# Patient Record
Sex: Female | Born: 1955
Health system: Southern US, Community
[De-identification: ages and names within clinical notes are randomized; demographics above are authoritative.]

## PROBLEM LIST (undated history)

## (undated) ENCOUNTER — Encounter

## (undated) ENCOUNTER — Encounter: Attending: Critical Care Medicine | Primary: Critical Care Medicine

## (undated) ENCOUNTER — Telehealth: Attending: Dermatology | Primary: Dermatology

## (undated) ENCOUNTER — Ambulatory Visit
Payer: MEDICARE | Attending: Student in an Organized Health Care Education/Training Program | Primary: Student in an Organized Health Care Education/Training Program

## (undated) ENCOUNTER — Ambulatory Visit: Payer: MEDICARE | Attending: Gastroenterology | Primary: Gastroenterology

## (undated) ENCOUNTER — Ambulatory Visit
Payer: Medicare (Managed Care) | Attending: Student in an Organized Health Care Education/Training Program | Primary: Student in an Organized Health Care Education/Training Program

## (undated) ENCOUNTER — Ambulatory Visit: Payer: MEDICARE

## (undated) ENCOUNTER — Encounter: Attending: Nephrology | Primary: Nephrology

## (undated) ENCOUNTER — Encounter
Payer: Medicare (Managed Care) | Attending: Student in an Organized Health Care Education/Training Program | Primary: Student in an Organized Health Care Education/Training Program

## (undated) ENCOUNTER — Encounter: Payer: MEDICARE | Attending: Otolaryngology | Primary: Otolaryngology

## (undated) ENCOUNTER — Ambulatory Visit

## (undated) ENCOUNTER — Encounter: Attending: Adult Health | Primary: Adult Health

## (undated) ENCOUNTER — Encounter: Attending: Obstetrics & Gynecology | Primary: Obstetrics & Gynecology

## (undated) ENCOUNTER — Encounter
Attending: Student in an Organized Health Care Education/Training Program | Primary: Student in an Organized Health Care Education/Training Program

## (undated) ENCOUNTER — Telehealth

## (undated) ENCOUNTER — Telehealth
Attending: Student in an Organized Health Care Education/Training Program | Primary: Student in an Organized Health Care Education/Training Program

## (undated) ENCOUNTER — Encounter: Attending: Psychologist | Primary: Psychologist

## (undated) ENCOUNTER — Encounter: Attending: Otolaryngology | Primary: Otolaryngology

## (undated) ENCOUNTER — Encounter: Payer: MEDICARE | Attending: Gastroenterology | Primary: Gastroenterology

## (undated) ENCOUNTER — Non-Acute Institutional Stay
Payer: MEDICARE | Attending: Student in an Organized Health Care Education/Training Program | Primary: Student in an Organized Health Care Education/Training Program

## (undated) ENCOUNTER — Ambulatory Visit: Payer: MEDICARE | Attending: Psychologist | Primary: Psychologist

## (undated) ENCOUNTER — Encounter
Payer: MEDICARE | Attending: Student in an Organized Health Care Education/Training Program | Primary: Student in an Organized Health Care Education/Training Program

## (undated) ENCOUNTER — Encounter: Attending: Psychiatry | Primary: Psychiatry

## (undated) ENCOUNTER — Ambulatory Visit
Payer: MEDICARE | Attending: Rehabilitative and Restorative Service Providers" | Primary: Rehabilitative and Restorative Service Providers"

## (undated) ENCOUNTER — Encounter: Attending: Medical | Primary: Medical

## (undated) ENCOUNTER — Ambulatory Visit: Payer: Medicare (Managed Care) | Attending: Psychologist | Primary: Psychologist

## (undated) ENCOUNTER — Ambulatory Visit: Payer: MEDICAID

## (undated) ENCOUNTER — Ambulatory Visit: Payer: MEDICARE | Attending: Adult Health | Primary: Adult Health

## (undated) ENCOUNTER — Ambulatory Visit: Payer: MEDICARE | Attending: Otolaryngology | Primary: Otolaryngology

## (undated) ENCOUNTER — Ambulatory Visit: Payer: PRIVATE HEALTH INSURANCE

## (undated) ENCOUNTER — Ambulatory Visit: Attending: Otolaryngology | Primary: Otolaryngology

## (undated) ENCOUNTER — Encounter: Attending: Family Medicine | Primary: Family Medicine

## (undated) ENCOUNTER — Ambulatory Visit: Attending: Pharmacist | Primary: Pharmacist

## (undated) ENCOUNTER — Encounter: Attending: Dermatology | Primary: Dermatology

## (undated) ENCOUNTER — Ambulatory Visit: Payer: Medicare (Managed Care) | Attending: Adult Health | Primary: Adult Health

## (undated) ENCOUNTER — Encounter: Payer: MEDICARE | Attending: Psychiatry | Primary: Psychiatry

## (undated) ENCOUNTER — Ambulatory Visit: Attending: Ambulatory Care | Primary: Ambulatory Care

## (undated) ENCOUNTER — Ambulatory Visit: Payer: Medicare (Managed Care) | Attending: Acute Care | Primary: Acute Care

## (undated) ENCOUNTER — Encounter: Payer: MEDICARE | Attending: Critical Care Medicine | Primary: Critical Care Medicine

## (undated) ENCOUNTER — Ambulatory Visit: Attending: Family Medicine | Primary: Family Medicine

## (undated) ENCOUNTER — Ambulatory Visit
Payer: Medicare (Managed Care) | Attending: Rehabilitative and Restorative Service Providers" | Primary: Rehabilitative and Restorative Service Providers"

## (undated) ENCOUNTER — Encounter: Payer: MEDICARE | Attending: Dermatology | Primary: Dermatology

## (undated) ENCOUNTER — Ambulatory Visit: Payer: Medicare (Managed Care)

## (undated) ENCOUNTER — Ambulatory Visit
Payer: PRIVATE HEALTH INSURANCE | Attending: Student in an Organized Health Care Education/Training Program | Primary: Student in an Organized Health Care Education/Training Program

## (undated) ENCOUNTER — Telehealth: Attending: Nephrology | Primary: Nephrology

## (undated) ENCOUNTER — Encounter: Attending: Gastroenterology | Primary: Gastroenterology

## (undated) ENCOUNTER — Encounter: Payer: MEDICARE | Attending: MOHS-Micrographic Surgery | Primary: MOHS-Micrographic Surgery

## (undated) ENCOUNTER — Ambulatory Visit: Payer: Medicaid (Managed Care) | Attending: Medical | Primary: Medical

## (undated) ENCOUNTER — Encounter: Attending: Pulmonary Disease | Primary: Pulmonary Disease

## (undated) ENCOUNTER — Encounter: Payer: MEDICARE | Attending: Nephrology | Primary: Nephrology

## (undated) ENCOUNTER — Encounter
Attending: Rehabilitative and Restorative Service Providers" | Primary: Rehabilitative and Restorative Service Providers"

## (undated) ENCOUNTER — Encounter: Attending: Pharmacist | Primary: Pharmacist

## (undated) ENCOUNTER — Ambulatory Visit: Payer: Medicare (Managed Care) | Attending: Otolaryngology | Primary: Otolaryngology

## (undated) ENCOUNTER — Telehealth: Attending: Critical Care Medicine | Primary: Critical Care Medicine

## (undated) ENCOUNTER — Encounter: Payer: MEDICARE | Attending: Psychologist | Primary: Psychologist

## (undated) ENCOUNTER — Encounter: Payer: MEDICARE | Attending: Neurology | Primary: Neurology

## (undated) ENCOUNTER — Encounter: Attending: Emergency Medicine | Primary: Emergency Medicine

## (undated) ENCOUNTER — Ambulatory Visit: Payer: Medicare (Managed Care) | Attending: Nephrology | Primary: Nephrology

## (undated) ENCOUNTER — Non-Acute Institutional Stay: Payer: MEDICARE

## (undated) ENCOUNTER — Encounter: Attending: Internal Medicine | Primary: Internal Medicine

## (undated) ENCOUNTER — Telehealth: Attending: Gastroenterology | Primary: Gastroenterology

## (undated) ENCOUNTER — Encounter: Payer: Medicare (Managed Care) | Attending: Medical | Primary: Medical

## (undated) ENCOUNTER — Ambulatory Visit: Payer: PRIVATE HEALTH INSURANCE | Attending: Medical | Primary: Medical

## (undated) ENCOUNTER — Ambulatory Visit: Payer: MEDICAID | Attending: Adult Health | Primary: Adult Health

## (undated) ENCOUNTER — Ambulatory Visit: Attending: Psychologist | Primary: Psychologist

## (undated) ENCOUNTER — Ambulatory Visit: Payer: Medicare (Managed Care) | Attending: Medical | Primary: Medical

## (undated) ENCOUNTER — Telehealth: Attending: Medical | Primary: Medical

## (undated) ENCOUNTER — Ambulatory Visit
Payer: MEDICAID | Attending: Student in an Organized Health Care Education/Training Program | Primary: Student in an Organized Health Care Education/Training Program

## (undated) DIAGNOSIS — I10 Essential (primary) hypertension: Secondary | ICD-10-CM

## (undated) DIAGNOSIS — M545 Low back pain, unspecified: Secondary | ICD-10-CM

## (undated) DIAGNOSIS — G8929 Other chronic pain: Secondary | ICD-10-CM

## (undated) DIAGNOSIS — M797 Fibromyalgia: Secondary | ICD-10-CM

## (undated) DIAGNOSIS — J8410 Pulmonary fibrosis, unspecified: Secondary | ICD-10-CM

## (undated) DIAGNOSIS — M329 Systemic lupus erythematosus, unspecified: Secondary | ICD-10-CM

## (undated) DIAGNOSIS — M1990 Unspecified osteoarthritis, unspecified site: Secondary | ICD-10-CM

## (undated) DIAGNOSIS — F419 Anxiety disorder, unspecified: Secondary | ICD-10-CM

## (undated) DIAGNOSIS — K219 Gastro-esophageal reflux disease without esophagitis: Secondary | ICD-10-CM

## (undated) DIAGNOSIS — E559 Vitamin D deficiency, unspecified: Secondary | ICD-10-CM

## (undated) DIAGNOSIS — N189 Chronic kidney disease, unspecified: Secondary | ICD-10-CM

## (undated) DIAGNOSIS — J310 Chronic rhinitis: Secondary | ICD-10-CM

## (undated) DIAGNOSIS — E119 Type 2 diabetes mellitus without complications: Secondary | ICD-10-CM

## (undated) DIAGNOSIS — R202 Paresthesia of skin: Secondary | ICD-10-CM

## (undated) DIAGNOSIS — J984 Other disorders of lung: Secondary | ICD-10-CM

## (undated) DIAGNOSIS — G4700 Insomnia, unspecified: Secondary | ICD-10-CM

## (undated) DIAGNOSIS — G5139 Clonic hemifacial spasm, unspecified: Secondary | ICD-10-CM

## (undated) DIAGNOSIS — H9191 Unspecified hearing loss, right ear: Secondary | ICD-10-CM

## (undated) DIAGNOSIS — E785 Hyperlipidemia, unspecified: Secondary | ICD-10-CM

## (undated) DIAGNOSIS — F319 Bipolar disorder, unspecified: Secondary | ICD-10-CM

## (undated) HISTORY — DX: Other chronic pain: G89.29

## (undated) HISTORY — DX: Bipolar disorder, unspecified: F31.9

## (undated) HISTORY — DX: Chronic rhinitis: J31.0

## (undated) HISTORY — DX: Other disorders of lung: J98.4

## (undated) HISTORY — DX: Essential (primary) hypertension: I10

## (undated) HISTORY — PX: BRAIN SURGERY: SHX531

## (undated) HISTORY — DX: Fibromyalgia: M79.7

## (undated) HISTORY — DX: Chronic kidney disease, unspecified: N18.9

## (undated) HISTORY — DX: Paresthesia of skin: R20.2

## (undated) HISTORY — DX: Unspecified hearing loss, right ear: H91.91

## (undated) HISTORY — PX: VAGINAL HYSTERECTOMY: SHX2639

## (undated) HISTORY — DX: Systemic lupus erythematosus, unspecified: M32.9

## (undated) HISTORY — DX: Hyperlipidemia, unspecified: E78.5

## (undated) HISTORY — DX: Gastro-esophageal reflux disease without esophagitis: K21.9

## (undated) HISTORY — DX: Low back pain, unspecified: M54.50

## (undated) HISTORY — DX: Unspecified osteoarthritis, unspecified site: M19.90

## (undated) HISTORY — DX: Anxiety disorder, unspecified: F41.9

## (undated) HISTORY — PX: CHOLECYSTECTOMY: SHX55

## (undated) HISTORY — DX: Clonic hemifacial spasm, unspecified: G51.39

## (undated) HISTORY — DX: Insomnia, unspecified: G47.00

## (undated) HISTORY — DX: Vitamin D deficiency, unspecified: E55.9

## (undated) HISTORY — PX: OTHER SURGICAL HISTORY: SHX169

## (undated) HISTORY — DX: Low back pain: M54.5

## (undated) MED ORDER — LOSARTAN 50 MG TABLET: Freq: Every day | ORAL | 0 days

---
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
## 2003-11-27 ENCOUNTER — Ambulatory Visit (HOSPITAL_COMMUNITY): Admission: RE | Admit: 2003-11-27 | Discharge: 2003-11-27 | Payer: Self-pay | Admitting: Orthopedic Surgery

## 2003-11-27 ENCOUNTER — Ambulatory Visit (HOSPITAL_BASED_OUTPATIENT_CLINIC_OR_DEPARTMENT_OTHER): Admission: RE | Admit: 2003-11-27 | Discharge: 2003-11-27 | Payer: Self-pay | Admitting: Orthopedic Surgery

## 2003-12-24 ENCOUNTER — Ambulatory Visit (HOSPITAL_BASED_OUTPATIENT_CLINIC_OR_DEPARTMENT_OTHER): Admission: RE | Admit: 2003-12-24 | Discharge: 2003-12-24 | Payer: Self-pay | Admitting: Orthopedic Surgery

## 2004-11-11 ENCOUNTER — Emergency Department: Payer: Self-pay | Admitting: Emergency Medicine

## 2005-01-25 DIAGNOSIS — I10 Essential (primary) hypertension: Secondary | ICD-10-CM | POA: Insufficient documentation

## 2008-02-09 ENCOUNTER — Emergency Department: Payer: Self-pay | Admitting: Emergency Medicine

## 2008-02-09 ENCOUNTER — Other Ambulatory Visit: Payer: Self-pay

## 2008-07-01 DIAGNOSIS — R110 Nausea: Secondary | ICD-10-CM

## 2008-10-17 ENCOUNTER — Emergency Department: Payer: Self-pay | Admitting: Emergency Medicine

## 2008-11-13 ENCOUNTER — Emergency Department: Payer: Self-pay | Admitting: Emergency Medicine

## 2008-12-05 ENCOUNTER — Ambulatory Visit: Payer: Self-pay | Admitting: Family Medicine

## 2009-10-22 ENCOUNTER — Ambulatory Visit: Payer: Self-pay | Admitting: Family Medicine

## 2009-11-24 ENCOUNTER — Emergency Department: Payer: Self-pay | Admitting: Emergency Medicine

## 2010-05-04 ENCOUNTER — Ambulatory Visit: Payer: Self-pay | Admitting: Family Medicine

## 2010-07-19 ENCOUNTER — Other Ambulatory Visit: Payer: Self-pay | Admitting: Nephrology

## 2010-07-30 ENCOUNTER — Other Ambulatory Visit: Payer: Self-pay | Admitting: Family Medicine

## 2010-08-02 ENCOUNTER — Emergency Department: Payer: Self-pay | Admitting: Emergency Medicine

## 2010-08-11 ENCOUNTER — Other Ambulatory Visit: Payer: Self-pay | Admitting: Family Medicine

## 2010-09-02 ENCOUNTER — Observation Stay: Payer: Self-pay | Admitting: Internal Medicine

## 2010-11-05 DIAGNOSIS — M3213 Lung involvement in systemic lupus erythematosus: Secondary | ICD-10-CM | POA: Insufficient documentation

## 2010-12-01 ENCOUNTER — Ambulatory Visit: Payer: Self-pay

## 2010-12-08 ENCOUNTER — Other Ambulatory Visit: Payer: Self-pay | Admitting: Nephrology

## 2010-12-29 DIAGNOSIS — D352 Benign neoplasm of pituitary gland: Secondary | ICD-10-CM | POA: Insufficient documentation

## 2010-12-29 DIAGNOSIS — D353 Benign neoplasm of craniopharyngeal duct: Secondary | ICD-10-CM

## 2011-01-03 ENCOUNTER — Ambulatory Visit: Payer: Self-pay | Admitting: General Surgery

## 2011-01-05 LAB — PATHOLOGY REPORT

## 2011-08-04 ENCOUNTER — Ambulatory Visit: Payer: Self-pay | Admitting: Internal Medicine

## 2012-01-29 ENCOUNTER — Emergency Department: Payer: Self-pay | Admitting: *Deleted

## 2012-05-04 DIAGNOSIS — N3946 Mixed incontinence: Secondary | ICD-10-CM | POA: Insufficient documentation

## 2012-06-20 ENCOUNTER — Emergency Department: Payer: Self-pay | Admitting: Emergency Medicine

## 2012-06-20 LAB — COMPREHENSIVE METABOLIC PANEL
Albumin: 3.4 g/dL (ref 3.4–5.0)
Anion Gap: 6 — ABNORMAL LOW (ref 7–16)
BUN: 10 mg/dL (ref 7–18)
Calcium, Total: 8.9 mg/dL (ref 8.5–10.1)
Chloride: 99 mmol/L (ref 98–107)
Co2: 33 mmol/L — ABNORMAL HIGH (ref 21–32)
EGFR (African American): >60
EGFR (Non-African Amer.): >60
Osmolality: 274 (ref 275–301)
Potassium: 3.8 mmol/L (ref 3.5–5.1)
SGPT (ALT): 21 U/L (ref 12–78)
Total Protein: 8.6 g/dL — ABNORMAL HIGH (ref 6.4–8.2)

## 2012-06-20 LAB — TROPONIN I: Troponin-I: <0.02 ng/mL

## 2012-06-20 LAB — URINALYSIS, COMPLETE
Bacteria: NONE SEEN
Nitrite: NEGATIVE
Protein: NEGATIVE
RBC,UR: 1 /HPF (ref 0–5)
Squamous Epithelial: 3

## 2012-06-20 LAB — CBC
HCT: 40.7 % (ref 35.0–47.0)
HGB: 13.5 g/dL (ref 12.0–16.0)
WBC: 4.1 10*3/uL (ref 3.6–11.0)

## 2012-06-20 LAB — TSH: Thyroid Stimulating Horm: 1.11 u[IU]/mL

## 2012-06-20 LAB — CK TOTAL AND CKMB (NOT AT ARMC): CK, Total: 215 U/L (ref 21–215)

## 2012-09-08 ENCOUNTER — Emergency Department: Payer: Self-pay | Admitting: Emergency Medicine

## 2012-09-08 LAB — COMPREHENSIVE METABOLIC PANEL
Albumin: 3.4 g/dL (ref 3.4–5.0)
Anion Gap: 4 — ABNORMAL LOW (ref 7–16)
Chloride: 107 mmol/L (ref 98–107)
Co2: 30 mmol/L (ref 21–32)
Creatinine: 0.95 mg/dL (ref 0.60–1.30)
EGFR (African American): >60
EGFR (Non-African Amer.): >60
Glucose: 102 mg/dL — ABNORMAL HIGH (ref 65–99)
Osmolality: 280 (ref 275–301)
Potassium: 4.1 mmol/L (ref 3.5–5.1)
SGPT (ALT): 28 U/L (ref 12–78)
Total Protein: 8.7 g/dL — ABNORMAL HIGH (ref 6.4–8.2)

## 2012-09-08 LAB — CBC
HCT: 39.4 % (ref 35.0–47.0)
HGB: 13.4 g/dL (ref 12.0–16.0)
RBC: 4.63 10*6/uL (ref 3.80–5.20)
RDW: 14.8 % — ABNORMAL HIGH (ref 11.5–14.5)

## 2012-09-09 ENCOUNTER — Observation Stay: Payer: Self-pay | Admitting: Internal Medicine

## 2012-09-09 LAB — TROPONIN I: Troponin-I: <0.02 ng/mL

## 2012-09-09 LAB — PROTIME-INR: Prothrombin Time: 13.8 secs (ref 11.5–14.7)

## 2012-09-14 LAB — CULTURE, BLOOD (SINGLE)

## 2012-10-22 ENCOUNTER — Ambulatory Visit (INDEPENDENT_AMBULATORY_CARE_PROVIDER_SITE_OTHER): Payer: Medicare Other | Admitting: Pulmonary Disease

## 2012-10-22 ENCOUNTER — Encounter: Payer: Self-pay | Admitting: Pulmonary Disease

## 2012-10-22 VITALS — BP 122/82 | HR 86 | Ht 68.0 in | Wt 285.0 lb

## 2012-10-22 DIAGNOSIS — Z8709 Personal history of other diseases of the respiratory system: Secondary | ICD-10-CM | POA: Insufficient documentation

## 2012-10-22 DIAGNOSIS — J690 Pneumonitis due to inhalation of food and vomit: Secondary | ICD-10-CM | POA: Insufficient documentation

## 2012-10-22 DIAGNOSIS — J8410 Pulmonary fibrosis, unspecified: Secondary | ICD-10-CM | POA: Insufficient documentation

## 2012-10-22 DIAGNOSIS — R062 Wheezing: Secondary | ICD-10-CM

## 2012-10-22 MED ORDER — BUDESONIDE-FORMOTEROL FUMARATE 160-4.5 MCG/ACT IN AERO: 2.0000 | INHALATION_SPRAY | Freq: Two times a day (BID) | RESPIRATORY_TRACT | Status: DC

## 2012-10-22 NOTE — Assessment & Plan Note (Signed)
She has a known diagnosis of vocal cord dysfunction which can certainly cause wheezing. I will obtain records from [facility] ear nose and throat and the  Voice Center there so I can learn the details of her treatment.  In the meantime we will start Symbicort twice a day to see if it helps with the wheezing.

## 2012-10-22 NOTE — Patient Instructions (Addendum)
We will request records from [facility] and [facility] We will send you to [facility] for pulmonary function testing Take the symbicort two puffs twice a day We will see you back in one month

## 2012-10-22 NOTE — Progress Notes (Signed)
Subjective:    Patient ID: Nicole Ballard, female    DOB: 21-Jun-1956, 57 y.o.   MRN: [id number]  HPI Nicole Ballard is a 57 year old very pleasant lifetime nonsmoker who comes to our clinic today for evaluation of shortness of breath, cough, wheezing, and sputum production. She states that she had a normal childhood without significant respiratory illnesses aside from some mild hayfever. 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 She worked there for 11 years and quit in the mid-2000. She was diagnosed with lupus around 2005 after experiencing muscle and joint aches. She has been followed for this at [facility] and currently takes plaque when out for the same. She tells me that she has "episcleritis" which has been treated with steroid drops by a local ophthalmologist. She has also been diagnosed with vocal cord dysfunction by ENT at [facility] and is currently undergoing therapy at a voice center there. She has had bronchitis on an annual basis for many years during adulthood. This has been treated with antibiotics and various inhalers. In January of 2014 she was hospitalized for shortness of breath, wheezing, cough, and sputum production. She says that she was told that she "possibly had pneumonia". Her hospitalization only lasted 2 days and she states that her symptoms have not resolved since hospital discharge. She has experienced chills but no fever since this time. She states that she takes an albuterol inhaler which provides some relief of cough and shortness of breath but makes her very jittery and anxious. She states that in the morning she frequently produces a scant amount of sputum. During this time she has experienced an increase in eye redness and itching but she denies sinus congestion or writing nose. She also denies heartburn symptoms.  She notes that she moved to a new apartment approximately 6 months  ago and the symptoms have developed since then. She also states that she has had increasing eye redness and itching since then.   Past Medical History  Diagnosis Date  . Fibromyalgia   . Paresthesia   . GERD (gastroesophageal reflux disease)   . Rhinitis   . Systemic lupus erythematosus   . Hemifacial spasm   . Chronic kidney disease   . Chronic pain   . Anxiety   . Vitamin D deficiency   . Bipolar 1 disorder   . Hypertension   . Osteoarthritis   . Lumbago   . Insomnia   . Hyperlipidemia   . Hearing loss of right ear      Family History  Problem Relation Age of Onset  . Breast cancer Mother   . Cancer Father   . Breast cancer Maternal Aunt      History   Social History  . Marital Status: Divorced    Spouse Name: N/A    Number of Children: N/A  . Years of Education: N/A   Occupational History  . Not on file.   Social History Main Topics  . Smoking status: Never Smoker   . Smokeless tobacco: Never Used  . Alcohol Use: No  . Drug Use: No  . Sexually Active: Not on file   Other Topics Concern  . Not on file   Social History Narrative  . No narrative on file     Allergies  Allergen Reactions  . Penicillins      No outpatient prescriptions prior to visit.   No facility-administered  medications prior to visit.      Review of Systems  Constitutional: Negative for fever and unexpected weight change.  HENT: Negative for ear pain, nosebleeds, congestion, sore throat, rhinorrhea, sneezing, trouble swallowing, dental problem, postnasal drip and sinus pressure.   Eyes: Negative for redness and itching.  Respiratory: Positive for cough, shortness of breath and wheezing. Negative for chest tightness.   Cardiovascular: Negative for palpitations and leg swelling.  Gastrointestinal: Negative for nausea and vomiting.  Genitourinary: Negative for dysuria.  Musculoskeletal: Negative for joint swelling.  Skin: Negative for rash.  Neurological: Negative for  headaches.  Hematological: Does not bruise/bleed easily.  Psychiatric/Behavioral: Negative for dysphoric mood. The patient is not nervous/anxious.        Objective:   Physical Exam  Filed Vitals:   10/22/12 1449  BP: 122/82  Pulse: 86  Height: 5\' 8"  (1.727 m)  Weight: 285 lb (129.275 kg)  SpO2: 96%   Gen: well appearing, no acute distress HEENT: NCAT, PERRL, EOMi, OP clear, neck supple without masses PULM: wheezes bilaterally, insp crackles in bases bilaterally CV: RRR, systolic murmur noted, no JVD AB: BS+, soft, nontender, no hsm Ext: warm, trace pretibial edema, no clubbing, no cyanosis Derm: no rash or skin breakdown Neuro: A&Ox4, CN II-XII intact, strength 5/5 in all 4 extremities       Assessment & Plan:   Postinflammatory pulmonary fibrosis Based on a chest x-ray from [place] in January 2014 I see increased interstitial markings and what looks like mediastinal lymphadenopathy. The differential diagnosis here includes sarcoidosis versus lupus pneumonitis versus NSIP and other interstitial lung diseases.  At this point, because she is wheezing we will focus today's intervention on adding a bronchodilator and steroid combination to see if that helps with her symptoms. In the meantime I will obtain records from [facility] rheumatology and ear nose and throat to see the details of her lupus workup.  We will also obtain full pulmonary function testing from [facility]. In one month's time, after I've been able to review the results of the pulmonary function tests, see how she does with Symbicort, and review the records from [facility] we will decide if she needs to have a CT scan to further evaluate what looks like an interstitial lung disease.  Plan summary: -Start Symbicort -Full pulmonary function test -Obtain records from [facility] rheumatology and ENT  Wheezing She has a known diagnosis of vocal cord dysfunction which can certainly cause wheezing. I  will obtain records from [facility] ear nose and throat and the  Voice Center there so I can learn the details of her treatment.  In the meantime we will start Symbicort twice a day to see if it helps with the wheezing.   Updated Medication List Outpatient Encounter Prescriptions as of 10/22/2012  Medication Sig Dispense Refill  . albuterol (PROVENTIL HFA) 108 (90 BASE) MCG/ACT inhaler Inhale 2 puffs into the lungs every 4 (four) hours as needed for wheezing.      Marland Kitchen ALPRAZolam (XANAX) 0.5 MG tablet Take 0.5 mg by mouth 2 (two) times daily as needed for sleep.      . Cholecalciferol (VITAMIN D) 2000 UNITS CAPS Take 1 capsule by mouth daily.      . clobetasol cream (TEMOVATE) 0.05 % Apply 1 application topically 2 (two) times daily.      . cyclobenzaprine (FLEXERIL) 10 MG tablet Take 10 mg by mouth 3 (three) times daily as needed for muscle spasms.      Marland Kitchen  Docusate Calcium (STOOL SOFTENER PO) Take by mouth as needed.      . DULoxetine (CYMBALTA) 60 MG capsule Take 60 mg by mouth daily.      . Evening Primrose topical oil Take 1,000 mg by mouth 3 (three) times daily.      . fluticasone (FLONASE) 50 MCG/ACT nasal spray Place 2 sprays into the nose daily.      Marland Kitchen gabapentin (NEURONTIN) 600 MG tablet Take 600 mg by mouth 4 (four) times daily.      . hydrochlorothiazide (HYDRODIURIL) 25 MG tablet Take 25 mg by mouth daily.      . hydroxychloroquine (PLAQUENIL) 200 MG tablet Take 200 mg by mouth 2 (two) times daily.      . hydrOXYzine (ATARAX/VISTARIL) 25 MG tablet Take 25 mg by mouth at bedtime as needed for itching.      . lamoTRIgine (LAMICTAL) 100 MG tablet Take 100 mg by mouth 2 (two) times daily.      Marland Kitchen loratadine (CLARITIN) 10 MG tablet Take 10 mg by mouth daily.      Marland Kitchen losartan (COZAAR) 100 MG tablet Take 100 mg by mouth daily.      . Multiple Vitamin (MULTIVITAMIN) tablet Take 1 tablet by mouth daily.      Marland Kitchen omeprazole (PRILOSEC) 40 MG capsule Take 40 mg by mouth daily.      . polyethylene  glycol-electrolytes (NULYTELY/GOLYTELY) 420 G solution Take 4,000 mLs by mouth once.      . selenium sulfide (SELSUN) 2.5 % shampoo Apply 1 application topically daily as needed for itching.      . traZODone (DESYREL) 150 MG tablet Take 150 mg by mouth at bedtime.      . budesonide-formoterol (SYMBICORT) 160-4.5 MCG/ACT inhaler Inhale 2 puffs into the lungs 2 (two) times daily.  1 Inhaler  12   No facility-administered encounter medications on file as of 10/22/2012.

## 2012-10-22 NOTE — Assessment & Plan Note (Signed)
Based on a chest x-ray from [place] in January 2014 I see increased interstitial markings and what looks like mediastinal lymphadenopathy. The differential diagnosis here includes sarcoidosis versus lupus pneumonitis versus NSIP and other interstitial lung diseases.  At this point, because she is wheezing we will focus today's intervention on adding a bronchodilator and steroid combination to see if that helps with her symptoms. In the meantime I will obtain records from [facility] rheumatology and ear nose and throat to see the details of her lupus workup.  We will also obtain full pulmonary function testing from [facility]. In one month's time, after I've been able to review the results of the pulmonary function tests, see how she does with Symbicort, and review the records from [facility] we will decide if she needs to have a CT scan to further evaluate what looks like an interstitial lung disease.  Plan summary: -Start Symbicort -Full pulmonary function test -Obtain records from [facility] rheumatology and ENT

## 2012-11-01 DIAGNOSIS — M1990 Unspecified osteoarthritis, unspecified site: Secondary | ICD-10-CM

## 2012-11-14 ENCOUNTER — Telehealth: Payer: Self-pay | Admitting: Pulmonary Disease

## 2012-11-15 NOTE — Telephone Encounter (Signed)
Pft scheduled 11/20/12 @9am  [place]. Pt has been informed .Nicole Ballard

## 2012-11-20 ENCOUNTER — Ambulatory Visit (INDEPENDENT_AMBULATORY_CARE_PROVIDER_SITE_OTHER): Payer: Medicaid Other | Admitting: Pulmonary Disease

## 2012-11-20 ENCOUNTER — Other Ambulatory Visit: Payer: Self-pay | Admitting: Pulmonary Disease

## 2012-11-20 ENCOUNTER — Encounter: Payer: Self-pay | Admitting: Pulmonary Disease

## 2012-11-20 ENCOUNTER — Ambulatory Visit: Payer: Self-pay | Admitting: Pulmonary Disease

## 2012-11-20 VITALS — BP 120/78 | HR 100 | Temp 98.0°F | Ht 68.0 in | Wt 283.0 lb

## 2012-11-20 DIAGNOSIS — R590 Localized enlarged lymph nodes: Secondary | ICD-10-CM | POA: Insufficient documentation

## 2012-11-20 DIAGNOSIS — J8410 Pulmonary fibrosis, unspecified: Secondary | ICD-10-CM

## 2012-11-20 LAB — PULMONARY FUNCTION TEST

## 2012-11-20 MED ORDER — BUDESONIDE-FORMOTEROL FUMARATE 160-4.5 MCG/ACT IN AERO: 1.0000 | INHALATION_SPRAY | Freq: Two times a day (BID) | RESPIRATORY_TRACT | Status: DC

## 2012-11-20 NOTE — Assessment & Plan Note (Addendum)
Her PFT's did not show obstruction but she had symptomatic improvement while taking Symbicort.  Some degree of her wheezing is due to her known diagnosis of vocal cord dysfunction and will only get better with ongoing behavorial therapy at [facility].    It also seems likely that there is a component of either sarcoid or asthma given her response to Symbicort.  The definitive way to sort this out would be to do a methacholine challenge (with laryngoscopy, ideally).  See discussion below.  Plan: -continue Symbicort -consider methacholine challenge vs pre-post bronchodilator testing if sarcoid work up negative -continue behavorial therapy at [facility] -request ENT records from [place] again

## 2012-11-20 NOTE — Progress Notes (Signed)
Subjective:    Patient ID: Nicole Ballard, female    DOB: 04-16-1956, 57 y.o.   MRN: [id number]  Synopsis: Nicole Ballard first saw the [facility] Pulmonary office in February 2014 for evaluation of shortness of breath and wheezing.  She had been hospitalized at [facility] in January 2014 for "possible pneumonia" and had a CXR with intersitial opacities and bilateral hilar adenopathy.  She was diagnosed with Lupus in 2005 by [facility] and was treated with Plaquenil.  She has also been diagnosed with vocal cord dysfunction by [facility] and in Feb 2014 was undergoing behavioral therapy.  11/2012 PFT's at [facility] showed moderate to severe restriction with a depressed ERV and DLCO.  HPI  11/20/2012 ROV -- Nicole Ballard has been doing farily well since the last visit.  She says that she continues to have some shortness of breath both at rest and with exertion. This is almost always associated with wheezing. She has stopped going to [facility] behavioral therapy for vocal cord dysfunction. She said that when she used the Symbicort she felt that her wheezing was decreased. However she still had some of these symptoms. Albuterol does not help the wheezing. She has not had a new fever, chills, chest pain, or weight loss. She is currently not taking the Symbicort because she ran out.   Past Medical History  Diagnosis Date  . Fibromyalgia   . Paresthesia   . GERD (gastroesophageal reflux disease)   . Rhinitis   . Systemic lupus erythematosus   . Hemifacial spasm   . Chronic kidney disease   . Chronic pain   . Anxiety   . Vitamin D deficiency   . Bipolar 1 disorder   . Hypertension   . Osteoarthritis   . Lumbago   . Insomnia   . Hyperlipidemia   . Hearing loss of right ear      Review of Systems  Constitutional: Negative for fever, chills and unexpected weight change.  HENT: Negative for congestion, rhinorrhea, sneezing and postnasal drip.   Respiratory: Positive for cough, chest tightness,  shortness of breath and wheezing.   Cardiovascular: Negative for chest pain and leg swelling.       Objective:   Physical Exam Filed Vitals:   11/20/12 1337  BP: 120/78  Pulse: 100  Temp: 98 F (36.7 C)  TempSrc: Oral  Height: 5\' 8"  (1.727 m)  Weight: 283 lb (128.368 kg)  SpO2: 94%   Gen: obese, well appearing, no acute distress HEENT: NCAT, PERRL, EOMi, OP clear, neck supple without masses PULM: CTA B CV: RRR, no mgr, no JVD AB: BS+, soft, nontender, no hsm Ext: warm, no edema, no clubbing, no cyanosis   January 2014 CXR > increased interstitial markings bilaterally, mediastinal lymphadenopathy 11/20/2012 Full PFT > ratio 77%, FEV1 1.57 L (57% predicted) total lung capacity 3.52 L (61% predicted), ERV 0.29 L (24% predicted), DLCO 16.6 (54% predicted)      Assessment & Plan:   Wheezing Her PFT's did not show obstruction but she had symptomatic improvement while taking Symbicort.  Some degree of her wheezing is due to her known diagnosis of vocal cord dysfunction and will only get better with ongoing behavorial therapy at [facility].    It also seems likely that there is a component of either sarcoid or asthma given her response to Symbicort.  The definitive way to sort this out would be to do a methacholine challenge (with laryngoscopy, ideally).  See discussion below.  Plan: -continue Symbicort -  consider methacholine challenge vs pre-post bronchodilator testing if sarcoid work up negative -continue behavorial therapy at [facility] -request ENT records from [facility] again  Postinflammatory pulmonary fibrosis This was seen in January 2014 CXR during a hospitalization for "pneumonia".  I have re-reviewed the images from [facility] again today and I see mediastinal lymphadenopathy on her 2014 studies that was not seen in 2010.  Radiology did not call this, but I feel strongly there is evidence of mediastinal lymphadenopathy.  Her PFT's showed restrictive physiology which could be due to a  pulmonary parenchymal process vs chest wall/obesity issues given the low ERV.  Plan: -CT chest without contrast to look for ILD, possibly sarcoid -if mediastinal lymphadenopathy confirmed, then she will need an EBUS guided lymph node biopsy -continue symbicort to treat sarcoid empirically for now  Mediastinal lymphadenopathy My review of her chest x-rays in January show this.  I explained to her that the ddx here includes sarcoid, infection, increased LVEDP, vs malignancy.  We will order a CT chest to confirm.   Updated Medication List Outpatient Encounter Prescriptions as of 11/20/2012  Medication Sig Dispense Refill  . albuterol (PROVENTIL HFA) 108 (90 BASE) MCG/ACT inhaler Inhale 2 puffs into the lungs every 4 (four) hours as needed for wheezing.      Marland Kitchen ALPRAZolam (XANAX) 0.5 MG tablet Take 0.5 mg by mouth 2 (two) times daily as needed for sleep.      . Cholecalciferol (VITAMIN D) 2000 UNITS CAPS Take 1 capsule by mouth daily.      . clobetasol cream (TEMOVATE) 0.05 % Apply 1 application topically 2 (two) times daily.      . cyclobenzaprine (FLEXERIL) 10 MG tablet Take 10 mg by mouth 3 (three) times daily as needed for muscle spasms.      Tery Sanfilippo Calcium (STOOL SOFTENER PO) Take by mouth as needed.      . DULoxetine (CYMBALTA) 60 MG capsule Take 60 mg by mouth daily.      . Evening Primrose topical oil Take 1,000 mg by mouth 3 (three) times daily.      Marland Kitchen gabapentin (NEURONTIN) 600 MG tablet Take 600 mg by mouth 4 (four) times daily.      . hydrochlorothiazide (HYDRODIURIL) 25 MG tablet Take 25 mg by mouth daily.      . hydroxychloroquine (PLAQUENIL) 200 MG tablet Take 200 mg by mouth 2 (two) times daily.      . hydrOXYzine (ATARAX/VISTARIL) 25 MG tablet Take 25 mg by mouth at bedtime as needed for itching.      . lamoTRIgine (LAMICTAL) 100 MG tablet 1 every am, 1 in the afternoon and 1 1/2 tablets at bedtime      . losartan (COZAAR) 100 MG tablet Take 100 mg by mouth daily.      .  Multiple Vitamin (MULTIVITAMIN) tablet Take 1 tablet by mouth daily.      Marland Kitchen omeprazole (PRILOSEC) 40 MG capsule Take 40 mg by mouth daily.      . polyethylene glycol-electrolytes (NULYTELY/GOLYTELY) 420 G solution Take 4,000 mLs by mouth once.      . selenium sulfide (SELSUN) 2.5 % shampoo Apply 1 application topically daily as needed for itching.      . traZODone (DESYREL) 150 MG tablet Take 150 mg by mouth at bedtime.      . budesonide-formoterol (SYMBICORT) 160-4.5 MCG/ACT inhaler Inhale 1 puff into the lungs 2 (two) times daily.  1 Inhaler  12  . fluticasone (FLONASE) 50 MCG/ACT nasal spray Place  2 sprays into the nose daily.      . [DISCONTINUED] budesonide-formoterol (SYMBICORT) 160-4.5 MCG/ACT inhaler Inhale 2 puffs into the lungs 2 (two) times daily.  1 Inhaler  12  . [DISCONTINUED] loratadine (CLARITIN) 10 MG tablet Take 10 mg by mouth daily.       No facility-administered encounter medications on file as of 11/20/2012.

## 2012-11-20 NOTE — Assessment & Plan Note (Signed)
This was seen in January 2014 CXR during a hospitalization for "pneumonia".  I have re-reviewed the images from [facility] again today and I see mediastinal lymphadenopathy on her 2014 studies that was not seen in 2010.  Radiology did not call this, but I feel strongly there is evidence of mediastinal lymphadenopathy.  Her PFT's showed restrictive physiology which could be due to a pulmonary parenchymal process vs chest wall/obesity issues given the low ERV.  Plan: -CT chest without contrast to look for ILD, possibly sarcoid -if mediastinal lymphadenopathy confirmed, then she will need an EBUS guided lymph node biopsy -continue symbicort to treat sarcoid empirically for now

## 2012-11-20 NOTE — Assessment & Plan Note (Signed)
My review of her chest x-rays in January show this.  I explained to her that the ddx here includes sarcoid, infection, increased LVEDP, vs malignancy.  We will order a CT chest to confirm.

## 2012-11-20 NOTE — Addendum Note (Signed)
Addended by: Tommie Sams on: 11/20/2012 02:57 PM   Modules accepted: Orders

## 2012-11-20 NOTE — Patient Instructions (Signed)
Pick up the symbicort from [facility] and take one puff twice a day no matter how you feel We will call you after we see the results of the CT scan.  Call our office if you have not heard from [place]. We will see you again in 4-6 weeks or sooner if needed

## 2012-11-23 ENCOUNTER — Telehealth: Payer: Self-pay | Admitting: Pulmonary Disease

## 2012-11-23 ENCOUNTER — Ambulatory Visit (INDEPENDENT_AMBULATORY_CARE_PROVIDER_SITE_OTHER)
Admission: RE | Admit: 2012-11-23 | Discharge: 2012-11-23 | Disposition: A | Discharge status: Home / Self Care | Payer: Medicaid Other | Source: Ambulatory Visit | Attending: Pulmonary Disease | Admitting: Pulmonary Disease

## 2012-11-23 DIAGNOSIS — J8410 Pulmonary fibrosis, unspecified: Secondary | ICD-10-CM

## 2012-11-23 DIAGNOSIS — J849 Interstitial pulmonary disease, unspecified: Secondary | ICD-10-CM

## 2012-11-23 NOTE — Telephone Encounter (Signed)
I spoke with Nicole Ballard. i advised her that it final report is not in system. Will have to get message over to Dr. Kendrick Fries for him to review results. Will give her a call once we know results. She voiced her understanding. Please advise thanks

## 2012-11-26 ENCOUNTER — Encounter: Payer: Self-pay | Admitting: Pulmonary Disease

## 2012-11-26 ENCOUNTER — Telehealth: Payer: Self-pay | Admitting: Cardiology

## 2012-11-26 DIAGNOSIS — R918 Other nonspecific abnormal finding of lung field: Secondary | ICD-10-CM | POA: Insufficient documentation

## 2012-11-26 DIAGNOSIS — J45909 Unspecified asthma, uncomplicated: Secondary | ICD-10-CM | POA: Insufficient documentation

## 2012-11-26 NOTE — Telephone Encounter (Signed)
Pt says nurse called re: results. Hazel Sams

## 2012-11-26 NOTE — Telephone Encounter (Signed)
Pt is aware of results. 

## 2012-11-26 NOTE — Telephone Encounter (Signed)
Pt added that she wants results today please. Nicole Ballard

## 2012-11-26 NOTE — Telephone Encounter (Signed)
Pt has never seen Dr Shirlee Latch. I spoke with pt and she is aware she needs to call [facility] for results.

## 2012-11-26 NOTE — Telephone Encounter (Signed)
New Prob   Calling to follow up on results of CT scan. Would like to speak to nurse.

## 2012-11-26 NOTE — Telephone Encounter (Signed)
Pt returning call can be reached at [phone].Nicole Ballard

## 2012-11-27 ENCOUNTER — Telehealth: Payer: Self-pay | Admitting: Pulmonary Disease

## 2012-11-27 NOTE — Telephone Encounter (Signed)
Notes Recorded by Lupita Leash, MD on 11/26/2012 at 8:43 AM L,  Please let her know that her CT chest looked OK. No enlarged lymph nodes. It looked like she perhaps has asthma (as we expected) and a few pulmonary nodules which look benign.  Thanks, B  Pt states she did not understand the results of her CT results that were given to her yesterday so I explained the results to her again and answered all questions. Nothing further needed. Carron Curie, CMA

## 2012-12-10 ENCOUNTER — Encounter: Payer: Self-pay | Admitting: Pulmonary Disease

## 2012-12-18 ENCOUNTER — Encounter: Payer: Self-pay | Admitting: Pulmonary Disease

## 2012-12-18 ENCOUNTER — Ambulatory Visit (INDEPENDENT_AMBULATORY_CARE_PROVIDER_SITE_OTHER): Payer: Medicaid Other | Admitting: Pulmonary Disease

## 2012-12-18 VITALS — BP 130/80 | HR 87 | Temp 98.1°F | Ht 68.0 in | Wt 282.0 lb

## 2012-12-18 DIAGNOSIS — R599 Enlarged lymph nodes, unspecified: Secondary | ICD-10-CM

## 2012-12-18 DIAGNOSIS — R590 Localized enlarged lymph nodes: Secondary | ICD-10-CM

## 2012-12-18 DIAGNOSIS — M549 Dorsalgia, unspecified: Secondary | ICD-10-CM | POA: Insufficient documentation

## 2012-12-18 DIAGNOSIS — J8410 Pulmonary fibrosis, unspecified: Secondary | ICD-10-CM

## 2012-12-18 DIAGNOSIS — J45909 Unspecified asthma, uncomplicated: Secondary | ICD-10-CM

## 2012-12-18 DIAGNOSIS — R918 Other nonspecific abnormal finding of lung field: Secondary | ICD-10-CM

## 2012-12-18 MED ORDER — LEVALBUTEROL TARTRATE 45 MCG/ACT IN AERO: 2.0000 | INHALATION_SPRAY | RESPIRATORY_TRACT | Status: DC | PRN

## 2012-12-18 NOTE — Assessment & Plan Note (Signed)
This was not seen on the recent CT chest.

## 2012-12-18 NOTE — Assessment & Plan Note (Signed)
I explained to her at length today that I believe that her shortness of breath is do to mild, intermittent asthma as well as vocal cord dysfunction. I think that there is a strong component of vocal cord dysfunction and for the third time today I advised that she go back to the [facility] for further therapy for this. Apparently her ear nose and throat doctor at [facility] has advised the same thing but she hasn't done it yet.  Plan: -Continue Symbicort -Change albuterol to Xopenex given the fact that she feels jittery on albuterol -Go back to the voice Center at [facility] for vocal cord dysfunction behavioral therapy

## 2012-12-18 NOTE — Assessment & Plan Note (Signed)
These did not have malignant features.  Plan: -repeat CT chest 6 months

## 2012-12-18 NOTE — Progress Notes (Signed)
Subjective:    Patient ID: Nicole Ballard, female    DOB: [birth year], 57 y.o.   MRN: [id number]  Synopsis: Nicole Ballard first saw the [facility] Pulmonary office in February 2014 for evaluation of shortness of breath and wheezing.  She had been hospitalized at [facility] in January 2014 for "possible pneumonia" and had a CXR with intersitial opacities and bilateral hilar adenopathy.  She was diagnosed with Lupus in 2005 by [facility] Rhe[facility] and was treated with Plaquenil.  She has also been diagnosed with vocal cord dysfunction by [facility] ENT and in Feb 2014 was undergoing behavioral therapy.  11/2012 PFT's at [facility] showed moderate to severe restriction with a depressed ERV and DLCO.  HPI   11/20/2012 ROV -- Nicole Ballard has been doing farily well since the last visit.  She says that she continues to have some shortness of breath both at rest and with exertion. This is almost always associated with wheezing. She has stopped going to [facility] behavioral therapy for vocal cord dysfunction. She said that when she used the Symbicort she felt that her wheezing was decreased. However she still had some of these symptoms. Albuterol does not help the wheezing. She has not had a new fever, chills, chest pain, or weight loss. She is currently not taking the Symbicort because she ran out.  12/18/2012 ROV -- Nicole Ballard has been doing well since the last visit. She says that the Symbicort has helped her shortness of breath some. She still has periods of shortness of breath which is improved with albuterol use. She does not like how the albuterol makes her feel jittery. She still feels a pain in her back when taking a deep breath. She has not been back to [facility] for behavioral therapy for her vocal cord dysfunction.  Past Medical History  Diagnosis Date  . Fibromyalgia   . Paresthesia   . GERD (gastroesophageal reflux disease)   . Rhinitis   . Systemic lupus erythematosus   . Hemifacial spasm   . Chronic  kidney disease   . Chronic pain   . Anxiety   . Vitamin D deficiency   . Bipolar 1 disorder   . Hypertension   . Osteoarthritis   . Lumbago   . Insomnia   . Hyperlipidemia   . Hearing loss of right ear      Review of Systems  Constitutional: Negative for fever, chills and unexpected weight change.  HENT: Negative for congestion, rhinorrhea, sneezing and postnasal drip.   Respiratory: Positive for cough, chest tightness, shortness of breath and wheezing.   Cardiovascular: Negative for chest pain and leg swelling.       Objective:   Physical Exam  Filed Vitals:   12/18/12 1039  BP: 130/80  Pulse: 87  Temp: 98.1 F (36.7 C)  TempSrc: Oral  Height: 5\' 8"  (1.727 m)  Weight: 282 lb (127.914 kg)  SpO2: 96%   Gen: obese, well appearing, no acute distress HEENT: NCAT, OP clear,  Eyes: PERRL, EOMi,  PULM: CTA B CV: RRR, no mgr, no JVD Ext: warm, no edema, no clubbing, no cyanosis Musculoskeletal: Mild tenderness to palpation of paraspinal musculature bilaterally and thoracic spine no point tenderness or masses noted   January 2014 CXR > increased interstitial markings bilaterally, mediastinal lymphadenopathy 11/20/2012 Full PFT > ratio 77%, FEV1 1.57 L (57% predicted) total lung capacity 3.52 L (61% predicted), ERV 0.29 L (24% predicted), DLCO 16.6 (54% predicted) 11/23/12 CT chest >> no mediastinal lymphadenopathy; some  increased interstitial markings and scattered ggo in R lung favored to be post infectious; few scattered pulmonary nodules, the largest of which is 6mm; some air trapping in bases     Assessment & Plan:   Pulmonary nodules These did not have malignant features.  Plan: -repeat CT chest 6 months  Postinflammatory pulmonary fibrosis Fortunately the recent CT scan did not show evidence of interstitial lung disease. I have assured her that I see no evidence of sarcoid or interstitial lung disease and I think that her shortness of breath is due to asthma and  vocal cord dysfunction.  Mediastinal lymphadenopathy This was not seen on the recent CT chest.  Asthma I explained to her at length today that I believe that her shortness of breath is do to mild, intermittent asthma as well as vocal cord dysfunction. I think that there is a strong component of vocal cord dysfunction and for the third time today I advised that she go back to the [facility] for further therapy for this. Apparently her ear nose and throat doctor at [facility] has advised the same thing but she hasn't done it yet.  Plan: -Continue Symbicort -Change albuterol to Xopenex given the fact that she feels jittery on albuterol -Go back to the voice Center at [facility] for vocal cord dysfunction behavioral therapy  Back pain She had some tenderness on exam consistent with musculoskeletal pain. There is nothing on her CT scan to explain the back pain. Given her significant arthritis which has been flaring lately I advised that she discuss this back pain with her rheumatologist.    Updated Medication List Outpatient Encounter Prescriptions as of 12/18/2012  Medication Sig Dispense Refill  . ALPRAZolam (XANAX) 0.5 MG tablet 2 tablets three times daily      . budesonide-formoterol (SYMBICORT) 160-4.5 MCG/ACT inhaler Inhale 1 puff into the lungs 2 (two) times daily.  1 Inhaler  12  . Cholecalciferol (VITAMIN D) 2000 UNITS CAPS Take 1 capsule by mouth daily.      . clobetasol cream (TEMOVATE) 0.05 % Apply 1 application topically 2 (two) times daily.      . cyclobenzaprine (FLEXERIL) 10 MG tablet Take 10 mg by mouth 3 (three) times daily as needed for muscle spasms.      Tery Sanfilippo Calcium (STOOL SOFTENER PO) Take by mouth as needed.      . DULoxetine (CYMBALTA) 60 MG capsule Take 120 mg by mouth at bedtime.       . Evening Primrose topical oil Take 1,000 mg by mouth 3 (three) times daily.      . fluticasone (FLONASE) 50 MCG/ACT nasal spray Place 2 sprays into the nose daily as needed.       .  gabapentin (NEURONTIN) 600 MG tablet Take 600 mg by mouth 4 (four) times daily.      . hydrochlorothiazide (HYDRODIURIL) 25 MG tablet Take 25 mg by mouth daily.      . hydroxychloroquine (PLAQUENIL) 200 MG tablet Take 200 mg by mouth 2 (two) times daily.      . hydrOXYzine (ATARAX/VISTARIL) 25 MG tablet Take 25 mg by mouth at bedtime as needed for itching.      . lamoTRIgine (LAMICTAL) 100 MG tablet 1 every am, 1 in the afternoon and 1 1/2 tablets at bedtime      . losartan (COZAAR) 100 MG tablet Take 100 mg by mouth daily.      . Multiple Vitamin (MULTIVITAMIN) tablet Take 1 tablet by mouth daily.      Marland Kitchen  omeprazole (PRILOSEC) 40 MG capsule Take 40 mg by mouth daily.      . polyethylene glycol-electrolytes (NULYTELY/GOLYTELY) 420 G solution Take 4,000 mLs by mouth once.      . selenium sulfide (SELSUN) 2.5 % shampoo Apply 1 application topically daily as needed for itching.      . traZODone (DESYREL) 150 MG tablet Take 150 mg by mouth at bedtime.      Marland Kitchen albuterol (PROVENTIL HFA) 108 (90 BASE) MCG/ACT inhaler Inhale 2 puffs into the lungs every 4 (four) hours as needed for wheezing.       No facility-administered encounter medications on file as of 12/18/2012.

## 2012-12-18 NOTE — Assessment & Plan Note (Signed)
Fortunately the recent CT scan did not show evidence of interstitial lung disease. I have assured her that I see no evidence of sarcoid or interstitial lung disease and I think that her shortness of breath is due to asthma and vocal cord dysfunction.

## 2012-12-18 NOTE — Assessment & Plan Note (Signed)
She had some tenderness on exam consistent with musculoskeletal pain. There is nothing on her CT scan to explain the back pain. Given her significant arthritis which has been flaring lately I advised that she discuss this back pain with her rheumatologist.

## 2012-12-18 NOTE — Patient Instructions (Addendum)
Keep taking the symbicort as you are doing Take the xopenex instead of the albuterol as needed for shortness of breath We will repeat the CT scan of your chest one year from now Talk to your rheumatologist about the back pain  We will see you back in 6 months or sooner if needed

## 2012-12-25 ENCOUNTER — Ambulatory Visit: Payer: Medicaid Other | Admitting: Pulmonary Disease

## 2013-01-03 ENCOUNTER — Telehealth: Payer: Self-pay | Admitting: Pulmonary Disease

## 2013-01-03 NOTE — Telephone Encounter (Signed)
Received PA request for xopenex HFA Called [employer] to initiate PA Was able to get med approved x 1 yr Approval code [id number] P Faxed approval notice to pharm

## 2013-01-15 ENCOUNTER — Telehealth: Payer: Self-pay | Admitting: Pulmonary Disease

## 2013-01-15 NOTE — Telephone Encounter (Signed)
No.  She has seen the voice center at [facility] and needs to go back there.  Somehow she was referred to pulmonary medicine clinic at [facility] erroneously recently.    There is no one in [place] that can treat VCD that I am aware of.

## 2013-01-15 NOTE — Telephone Encounter (Signed)
Spoke with patient,  Made her aware of recs per Dr.McQuaid Pt was disappointed as she wanted someone more local for this but understands. Nothing further needed at this time

## 2013-01-15 NOTE — Telephone Encounter (Signed)
Patient is needing a referral to a speech therapist in [place] .

## 2013-01-15 NOTE — Telephone Encounter (Signed)
Dr. Kendrick Fries please advise if you are okay with making this referral.  Thanks.

## 2013-01-30 ENCOUNTER — Encounter: Payer: Self-pay | Admitting: Pulmonary Disease

## 2013-02-01 ENCOUNTER — Telehealth: Payer: Self-pay | Admitting: Pulmonary Disease

## 2013-02-01 NOTE — Telephone Encounter (Signed)
Pt is aware to contact [facility] in order to get refill. According to our records a year's supply was sent in, in 11/2012. She verbalized understanding. Nothing further was needed.

## 2013-02-03 ENCOUNTER — Encounter: Payer: Self-pay | Admitting: Pulmonary Disease

## 2013-02-21 ENCOUNTER — Emergency Department: Payer: Self-pay | Admitting: Unknown Physician Specialty

## 2013-02-21 LAB — CBC WITH DIFFERENTIAL/PLATELET
Basophil %: 1.4 %
Eosinophil #: 0.2 10*3/uL (ref 0.0–0.7)
Eosinophil %: 4.2 %
HCT: 39.3 % (ref 35.0–47.0)
Lymphocyte #: 1.6 10*3/uL (ref 1.0–3.6)
Lymphocyte %: 33.4 %
MCH: 26.7 pg (ref 26.0–34.0)
MCHC: 32.9 g/dL (ref 32.0–36.0)
MCV: 81 fL (ref 80–100)
Monocyte #: 0.7 x10 3/mm (ref 0.2–0.9)
Neutrophil #: 2.1 10*3/uL (ref 1.4–6.5)
Neutrophil %: 45.3 %
Platelet: 300 10*3/uL (ref 150–440)
RBC: 4.84 10*6/uL (ref 3.80–5.20)

## 2013-02-21 LAB — URINALYSIS, COMPLETE
Blood: NEGATIVE
Ketone: NEGATIVE
Leukocyte Esterase: NEGATIVE
Nitrite: NEGATIVE
Ph: 7 (ref 4.5–8.0)
Protein: NEGATIVE
Specific Gravity: 1.004 (ref 1.003–1.030)
WBC UR: NONE SEEN /HPF (ref 0–5)

## 2013-02-21 LAB — COMPREHENSIVE METABOLIC PANEL
Albumin: 3.3 g/dL — ABNORMAL LOW (ref 3.4–5.0)
Alkaline Phosphatase: 73 U/L (ref 50–136)
Anion Gap: 2 — ABNORMAL LOW (ref 7–16)
BUN: 8 mg/dL (ref 7–18)
Creatinine: 0.84 mg/dL (ref 0.60–1.30)
EGFR (Non-African Amer.): >60
Glucose: 87 mg/dL (ref 65–99)
Osmolality: 270 (ref 275–301)
Potassium: 4 mmol/L (ref 3.5–5.1)

## 2013-02-21 LAB — T4, FREE: Free Thyroxine: 0.82 ng/dL (ref 0.76–1.46)

## 2013-02-21 LAB — PRO B NATRIURETIC PEPTIDE: B-Type Natriuretic Peptide: 19 pg/mL (ref 0–125)

## 2013-03-05 ENCOUNTER — Encounter: Payer: Self-pay | Admitting: Pulmonary Disease

## 2013-03-19 DIAGNOSIS — F411 Generalized anxiety disorder: Secondary | ICD-10-CM | POA: Insufficient documentation

## 2013-04-08 ENCOUNTER — Emergency Department: Payer: Self-pay | Admitting: Emergency Medicine

## 2013-04-08 LAB — CBC
HGB: 14.5 g/dL (ref 12.0–16.0)
MCHC: 33.8 g/dL (ref 32.0–36.0)
MCV: 81 fL (ref 80–100)
RBC: 5.29 10*6/uL — ABNORMAL HIGH (ref 3.80–5.20)
RDW: 14.5 % (ref 11.5–14.5)
WBC: 5.2 10*3/uL (ref 3.6–11.0)

## 2013-04-08 LAB — COMPREHENSIVE METABOLIC PANEL
Albumin: 3.6 g/dL (ref 3.4–5.0)
Alkaline Phosphatase: 80 U/L (ref 50–136)
Anion Gap: 1 — ABNORMAL LOW (ref 7–16)
Chloride: 103 mmol/L (ref 98–107)
Creatinine: 0.95 mg/dL (ref 0.60–1.30)
EGFR (African American): >60
EGFR (Non-African Amer.): >60
Osmolality: 275 (ref 275–301)
Potassium: 4.4 mmol/L (ref 3.5–5.1)
SGOT(AST): 31 U/L (ref 15–37)
SGPT (ALT): 24 U/L (ref 12–78)

## 2013-04-08 LAB — URINALYSIS, COMPLETE
Glucose,UR: NEGATIVE mg/dL (ref 0–75)
Leukocyte Esterase: NEGATIVE
Nitrite: NEGATIVE
Ph: 5 (ref 4.5–8.0)
Protein: 100
Specific Gravity: 1.031 (ref 1.003–1.030)

## 2013-04-14 ENCOUNTER — Emergency Department: Payer: Self-pay | Admitting: Unknown Physician Specialty

## 2013-04-14 LAB — URINALYSIS, COMPLETE
Blood: NEGATIVE
Ketone: NEGATIVE
Nitrite: NEGATIVE
Specific Gravity: 1.019 (ref 1.003–1.030)
WBC UR: 3 /HPF (ref 0–5)

## 2013-04-14 LAB — COMPREHENSIVE METABOLIC PANEL
Albumin: 3.8 g/dL (ref 3.4–5.0)
Anion Gap: 3 — ABNORMAL LOW (ref 7–16)
Bilirubin,Total: 0.3 mg/dL (ref 0.2–1.0)
Calcium, Total: 9.6 mg/dL (ref 8.5–10.1)
Chloride: 98 mmol/L (ref 98–107)
EGFR (African American): >60
Glucose: 86 mg/dL (ref 65–99)
SGPT (ALT): 25 U/L (ref 12–78)
Sodium: 135 mmol/L — ABNORMAL LOW (ref 136–145)
Total Protein: 8.9 g/dL — ABNORMAL HIGH (ref 6.4–8.2)

## 2013-04-14 LAB — CBC
MCHC: 34 g/dL (ref 32.0–36.0)
MCV: 82 fL (ref 80–100)
Platelet: 355 10*3/uL (ref 150–440)
RBC: 5.39 10*6/uL — ABNORMAL HIGH (ref 3.80–5.20)
WBC: 5 10*3/uL (ref 3.6–11.0)

## 2013-04-14 LAB — LIPASE, BLOOD: Lipase: 122 U/L (ref 73–393)

## 2013-05-13 ENCOUNTER — Ambulatory Visit: Payer: Self-pay | Admitting: Pulmonary Disease

## 2013-05-21 ENCOUNTER — Ambulatory Visit: Payer: Self-pay | Admitting: Pulmonary Disease

## 2013-05-28 ENCOUNTER — Ambulatory Visit (INDEPENDENT_AMBULATORY_CARE_PROVIDER_SITE_OTHER): Payer: Medicare Other | Admitting: Pulmonary Disease

## 2013-05-28 ENCOUNTER — Encounter: Payer: Self-pay | Admitting: Pulmonary Disease

## 2013-05-28 VITALS — BP 112/66 | HR 90 | Temp 98.6°F | Ht 68.0 in | Wt 260.0 lb

## 2013-05-28 DIAGNOSIS — J8410 Pulmonary fibrosis, unspecified: Secondary | ICD-10-CM

## 2013-05-28 DIAGNOSIS — J45909 Unspecified asthma, uncomplicated: Secondary | ICD-10-CM

## 2013-05-28 DIAGNOSIS — R05 Cough: Secondary | ICD-10-CM

## 2013-05-28 DIAGNOSIS — J383 Other diseases of vocal cords: Secondary | ICD-10-CM | POA: Insufficient documentation

## 2013-05-28 NOTE — Assessment & Plan Note (Signed)
She never really had any evidence of ILD on her CT chest.  I once questioned mediastinal lymphadenopathy but this was not clearly seen there either.  She would like me to check a CXR to make sure that she does not have lymphadenopathy in relation to to her weight loss.  I think that the weight loss is due to her dieting, but we will order a Chest X-ray just to make sure.

## 2013-05-28 NOTE — Progress Notes (Signed)
Subjective:    Patient ID: Nicole Ballard, female    DOB: Jul 02, 1956, 57 y.o.   MRN: [id number]  Synopsis: Ms. Mast first saw the [facility] Pulmonary office in February 2014 for evaluation of shortness of breath and wheezing.  She had been hospitalized at [facility] in January 2014 for "possible pneumonia" and had a CXR with intersitial opacities and bilateral hilar adenopathy.  She was diagnosed with Lupus in 2005 by [facility] Rheum and was treated with Plaquenil.  She has also been diagnosed with vocal cord dysfunction by [facility] ENT and in Feb 2014 was undergoing behavioral therapy.  11/2012 PFT's at [facility] showed moderate to severe restriction with a depressed ERV and DLCO. 11/2012 CT chest just showed resolving post infectious changes and no clear lymphadenopathy or interstitial lung disease.  Vocal cord dysfunction behavioral therapy at [facility] has been helpful.  HPI   11/20/2012 ROV -- Ms. Grumbine has been doing farily well since the last visit.  She says that she continues to have some shortness of breath both at rest and with exertion. This is almost always associated with wheezing. She has stopped going to [facility] behavioral therapy for vocal cord dysfunction. She said that when she used the Symbicort she felt that her wheezing was decreased. However she still had some of these symptoms. Albuterol does not help the wheezing. She has not had a new fever, chills, chest pain, or weight loss. She is currently not taking the Symbicort because she ran out.  12/18/2012 ROV -- Emalea has been doing well since the last visit. She says that the Symbicort has helped her shortness of breath some. She still has periods of shortness of breath which is improved with albuterol use. She does not like how the albuterol makes her feel jittery. She still feels a pain in her back when taking a deep breath. She has not been back to [facility] for behavioral therapy for her vocal cord dysfunction.  05/28/2013  ROV > Marsela has been doing well since the last visit.  She has been taking symbicort maybe twice a week, sometimes only needs it every two weeks.  She uses this when she feels increasing wheezing or cough, but this isn't often.  She is not using albuterol.  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  She has not had much cough or wheeze since starting this.  She has lost 40 lbs lately with diet and exercise.   She wants to make sure that this is not related to a lung problem even though her symptoms have been fine as noted above.   Past Medical History  Diagnosis Date  . Fibromyalgia   . Paresthesia   . GERD (gastroesophageal reflux disease)   . Rhinitis   . Systemic lupus erythematosus   . Hemifacial spasm   . Chronic kidney disease   . Chronic pain   . Anxiety   . Vitamin D deficiency   . Bipolar 1 disorder   . Hypertension   . Osteoarthritis   . Lumbago   . Insomnia   . Hyperlipidemia   . Hearing loss of right ear      Review of Systems  Constitutional: Negative for fever, chills and unexpected weight change.  HENT: Negative for congestion, rhinorrhea, sneezing and postnasal drip.   Respiratory: Positive for cough. Negative for chest tightness, shortness of breath and wheezing.   Cardiovascular: Negative for chest pain and leg swelling.  Objective:   Physical Exam  Filed Vitals:   05/28/13 1551  BP: 112/66  Pulse: 90  Temp: 98.6 F (37 C)  TempSrc: Oral  Height: 5\' 8"  (1.727 m)  Weight: 260 lb (117.935 kg)  SpO2: 96%   Gen: obese, well appearing, no acute distress HEENT: NCAT, OP clear,  Eyes: PERRL, EOMi,  PULM: CTA B CV: RRR, no mgr, no JVD Ext: warm, no edema, no clubbing, no cyanosis    January 2014 CXR > increased interstitial markings bilaterally, mediastinal lymphadenopathy 11/20/2012 Full PFT > ratio 77%, FEV1 1.57 L (57% predicted) total lung capacity 3.52 L (61% predicted), ERV 0.29 L (24%  predicted), DLCO 16.6 (54% predicted) 11/23/12 CT chest >> no mediastinal lymphadenopathy; some increased interstitial markings and scattered ggo in R lung favored to be post infectious; few scattered pulmonary nodules, the largest of which is 6mm; some air trapping in bases     Assessment & Plan:   Asthma This has been a stable interval for [place].  Most of her cough and wheeze is due to vocal cord dysfunction which is better.  Symbicort is really to much drug for her.  Plan: -albuterol prn instead of ventolin -flu shot is up to date -d/c symbicort -f/u 6 months  Postinflammatory pulmonary fibrosis She never really had any evidence of ILD on her CT chest.  I once questioned mediastinal lymphadenopathy but this was not clearly seen there either.  She would like me to check a CXR to make sure that she does not have lymphadenopathy in relation to to her weight loss.  I think that the weight loss is due to her dieting, but we will order a Chest X-ray just to make sure.  Vocal cord dysfunction Much improved after behavioral therapy. Continue behavioral therapy at home    Updated Medication List Outpatient Encounter Prescriptions as of 05/28/2013  Medication Sig Dispense Refill  . ALPRAZolam (XANAX) 0.5 MG tablet 2 tablets three times daily      . budesonide-formoterol (SYMBICORT) 160-4.5 MCG/ACT inhaler Inhale 1 puff into the lungs 2 (two) times daily as needed.      . Cholecalciferol (VITAMIN D) 2000 UNITS CAPS Take 1 capsule by mouth daily.      . clobetasol cream (TEMOVATE) 0.05 % Apply 1 application topically 2 (two) times daily.      . cyclobenzaprine (FLEXERIL) 10 MG tablet Take 10 mg by mouth 3 (three) times daily as needed for muscle spasms.      . DULoxetine (CYMBALTA) 60 MG capsule Take 120 mg by mouth at bedtime.       . fluticasone (FLONASE) 50 MCG/ACT nasal spray Place 2 sprays into the nose daily as needed.       . hydrochlorothiazide (HYDRODIURIL) 25 MG tablet Take 25 mg by  mouth daily.      . hydroxychloroquine (PLAQUENIL) 200 MG tablet Take 200 mg by mouth 2 (two) times daily.      . hydrOXYzine (ATARAX/VISTARIL) 25 MG tablet Take 25 mg by mouth at bedtime as needed for itching.      . losartan (COZAAR) 100 MG tablet Take 100 mg by mouth daily.      . Multiple Vitamin (MULTIVITAMIN) tablet Take 1 tablet by mouth daily.      Marland Kitchen omeprazole (PRILOSEC) 40 MG capsule Take 40 mg by mouth daily.      . polyethylene glycol-electrolytes (NULYTELY/GOLYTELY) 420 G solution Take 4,000 mLs by mouth once.      . selenium sulfide (SELSUN)  2.5 % shampoo Apply 1 application topically daily as needed for itching.      . traZODone (DESYREL) 150 MG tablet Take 150 mg by mouth at bedtime.      . [DISCONTINUED] budesonide-formoterol (SYMBICORT) 160-4.5 MCG/ACT inhaler Inhale 1 puff into the lungs 2 (two) times daily.  1 Inhaler  12  . [DISCONTINUED] Docusate Calcium (STOOL SOFTENER PO) Take by mouth as needed.      . [DISCONTINUED] Evening Primrose topical oil Take 1,000 mg by mouth 3 (three) times daily.      . [DISCONTINUED] gabapentin (NEURONTIN) 600 MG tablet Take 600 mg by mouth 4 (four) times daily.      . [DISCONTINUED] lamoTRIgine (LAMICTAL) 100 MG tablet 1 every am, 1 in the afternoon and 1 1/2 tablets at bedtime      . [DISCONTINUED] levalbuterol (XOPENEX HFA) 45 MCG/ACT inhaler Inhale 2 puffs into the lungs every 4 (four) hours as needed for wheezing.  1 Inhaler  2   No facility-administered encounter medications on file as of 05/28/2013.

## 2013-05-28 NOTE — Assessment & Plan Note (Signed)
This has been a stable interval for Nicole Ballard.  Most of her cough and wheeze is due to vocal cord dysfunction which is better.  Symbicort is really to much drug for her.  Plan: -albuterol prn instead of ventolin -flu shot is up to date -d/c symbicort -f/u 6 months

## 2013-05-28 NOTE — Assessment & Plan Note (Signed)
Much improved after behavioral therapy. Continue behavioral therapy at home

## 2013-05-28 NOTE — Patient Instructions (Signed)
Keep doing your voice treatments at home Use albuterol when you get increasing shortness of breath We will send you for a chest x-ray and call you with the results  We will see you back in 6 months or sooner if needed

## 2013-05-29 ENCOUNTER — Ambulatory Visit (INDEPENDENT_AMBULATORY_CARE_PROVIDER_SITE_OTHER)
Admission: RE | Admit: 2013-05-29 | Discharge: 2013-05-29 | Disposition: A | Discharge status: Home / Self Care | Payer: Medicare Other | Source: Ambulatory Visit | Attending: Pulmonary Disease | Admitting: Pulmonary Disease

## 2013-05-29 DIAGNOSIS — R05 Cough: Secondary | ICD-10-CM

## 2013-05-30 NOTE — Progress Notes (Signed)
Quick Note:  Spoke with pt and notified of results per Dr. McQuaid. Pt verbalized understanding and denied any questions.  ______ 

## 2013-06-11 ENCOUNTER — Telehealth: Payer: Self-pay | Admitting: Pulmonary Disease

## 2013-06-11 MED ORDER — ALBUTEROL SULFATE HFA 108 (90 BASE) MCG/ACT IN AERS: 2.0000 | INHALATION_SPRAY | Freq: Four times a day (QID) | RESPIRATORY_TRACT | Status: DC | PRN

## 2013-06-11 NOTE — Telephone Encounter (Signed)
I spoke with pt and she stated she needed her proair sent to the pharmacy. This was never done at last OV. I advised will do so. Nothing further needed

## 2013-06-18 ENCOUNTER — Telehealth: Payer: Self-pay | Admitting: Pulmonary Disease

## 2013-06-18 NOTE — Telephone Encounter (Signed)
Please tell he to resume the Symbicort one puff twice a day until she sees me next

## 2013-06-18 NOTE — Telephone Encounter (Signed)
Pt advised. Amrie Gurganus, CMA  

## 2013-06-18 NOTE — Telephone Encounter (Signed)
Spoke with pt and she reports that rx for Albuterol that was given on 06-11-13 does not seem to be helping.  Feels SOB even after using Albuterol.  Denies wheezing, cough, or chest tightness.  Pt has been off of Symbicort since ov on 05-28-13 and was only using it prn at that time.  Please advise

## 2013-07-03 ENCOUNTER — Emergency Department: Payer: Self-pay | Admitting: Emergency Medicine

## 2013-07-03 LAB — COMPREHENSIVE METABOLIC PANEL
Alkaline Phosphatase: 90 U/L (ref 50–136)
Anion Gap: 1 — ABNORMAL LOW (ref 7–16)
BUN: 11 mg/dL (ref 7–18)
Bilirubin,Total: 0.3 mg/dL (ref 0.2–1.0)
Chloride: 102 mmol/L (ref 98–107)
Creatinine: 1.15 mg/dL (ref 0.60–1.30)
EGFR (African American): >60
Glucose: 100 mg/dL — ABNORMAL HIGH (ref 65–99)
Osmolality: 270 (ref 275–301)
SGOT(AST): 32 U/L (ref 15–37)
Sodium: 135 mmol/L — ABNORMAL LOW (ref 136–145)

## 2013-07-03 LAB — CBC WITH DIFFERENTIAL/PLATELET
Basophil #: 0.1 10*3/uL (ref 0.0–0.1)
Eosinophil #: 0.3 10*3/uL (ref 0.0–0.7)
HCT: 44.3 % (ref 35.0–47.0)
HGB: 14.7 g/dL (ref 12.0–16.0)
Lymphocyte #: 1.6 10*3/uL (ref 1.0–3.6)
Lymphocyte %: 29 %
MCHC: 33.1 g/dL (ref 32.0–36.0)
MCV: 84 fL (ref 80–100)
Monocyte %: 9 %
Neutrophil #: 3 10*3/uL (ref 1.4–6.5)
Platelet: 352 10*3/uL (ref 150–440)
RDW: 14.4 % (ref 11.5–14.5)
WBC: 5.5 10*3/uL (ref 3.6–11.0)

## 2013-07-03 LAB — URINALYSIS, COMPLETE
Blood: NEGATIVE
Ketone: NEGATIVE
Ph: 5 (ref 4.5–8.0)
Protein: 100
Squamous Epithelial: 9
WBC UR: 1 /HPF (ref 0–5)

## 2013-07-03 LAB — TROPONIN I: Troponin-I: <0.02 ng/mL

## 2013-07-08 ENCOUNTER — Telehealth: Payer: Self-pay | Admitting: Pulmonary Disease

## 2013-07-08 NOTE — Telephone Encounter (Signed)
Pt decided not to leave a message.  Holly D Pryor ° °

## 2013-08-11 ENCOUNTER — Emergency Department: Payer: Self-pay | Admitting: Emergency Medicine

## 2013-08-11 LAB — BASIC METABOLIC PANEL
Anion Gap: 6 — ABNORMAL LOW (ref 7–16)
BUN: 12 mg/dL (ref 7–18)
Calcium, Total: 9.3 mg/dL (ref 8.5–10.1)
Chloride: 102 mmol/L (ref 98–107)
Co2: 29 mmol/L (ref 21–32)
EGFR (African American): >60
EGFR (Non-African Amer.): >60
Glucose: 96 mg/dL (ref 65–99)
Osmolality: 273 (ref 275–301)
Potassium: 4 mmol/L (ref 3.5–5.1)

## 2013-08-11 LAB — CBC WITH DIFFERENTIAL/PLATELET
Eosinophil #: 0.2 10*3/uL (ref 0.0–0.7)
Eosinophil %: 2.6 %
HCT: 42.9 % (ref 35.0–47.0)
Lymphocyte #: 2.2 10*3/uL (ref 1.0–3.6)
Lymphocyte %: 35.5 %
MCV: 83 fL (ref 80–100)
Monocyte %: 11.5 %
Neutrophil #: 3.1 10*3/uL (ref 1.4–6.5)
Neutrophil %: 49 %
Platelet: 364 10*3/uL (ref 150–440)
RBC: 5.17 10*6/uL (ref 3.80–5.20)
RDW: 14.4 % (ref 11.5–14.5)

## 2013-08-11 LAB — TROPONIN I: Troponin-I: <0.02 ng/mL

## 2013-08-23 LAB — HM COLONOSCOPY: HM COLON: NORMAL

## 2013-09-20 ENCOUNTER — Telehealth: Payer: Self-pay | Admitting: Pulmonary Disease

## 2013-09-20 ENCOUNTER — Emergency Department: Payer: Self-pay | Admitting: Emergency Medicine

## 2013-09-20 LAB — BASIC METABOLIC PANEL
ANION GAP: 3 — AB (ref 7–16)
BUN: 8 mg/dL (ref 7–18)
Calcium, Total: 9.1 mg/dL (ref 8.5–10.1)
Chloride: 102 mmol/L (ref 98–107)
Co2: 32 mmol/L (ref 21–32)
Creatinine: 0.9 mg/dL (ref 0.60–1.30)
EGFR (African American): >60
EGFR (Non-African Amer.): >60
Glucose: 92 mg/dL (ref 65–99)
Osmolality: 272 (ref 275–301)
Potassium: 4.2 mmol/L (ref 3.5–5.1)
SODIUM: 137 mmol/L (ref 136–145)

## 2013-09-20 LAB — CBC
HCT: 41.8 % (ref 35.0–47.0)
HGB: 13.8 g/dL (ref 12.0–16.0)
MCH: 27.5 pg (ref 26.0–34.0)
MCHC: 33.1 g/dL (ref 32.0–36.0)
MCV: 83 fL (ref 80–100)
PLATELETS: 313 10*3/uL (ref 150–440)
RBC: 5.02 10*6/uL (ref 3.80–5.20)
RDW: 14.2 % (ref 11.5–14.5)
WBC: 4 10*3/uL (ref 3.6–11.0)

## 2013-09-20 LAB — TROPONIN I

## 2013-09-20 NOTE — Telephone Encounter (Signed)
[email or web address].Devean Skoczylas, CMA  

## 2013-09-23 NOTE — Telephone Encounter (Signed)
Spoke with patient-she states she called due to feeling like she was/is having a flare up of her asthma; she started using her symbicort and albuterol rescue inhaler; pt was confused on what to take and when. I reviewed with patient to use her Symbicort inhaler at 1 puff BID as needed per instructions by BQ-flare ups. Pt is aware and will use as needed when having flare ups and keep albuterol inhaler near by for acute SOB and wheezing. Pt aware to call us if no better for OV/ck in with BQ.

## 2013-09-23 NOTE — Telephone Encounter (Signed)
Will forward to BQ as FYI of what was told to patient.

## 2013-09-26 DIAGNOSIS — H15109 Unspecified episcleritis, unspecified eye: Secondary | ICD-10-CM | POA: Insufficient documentation

## 2013-11-04 LAB — HM MAMMOGRAPHY: HM Mammogram: NORMAL

## 2013-12-10 ENCOUNTER — Emergency Department: Payer: Self-pay | Admitting: Emergency Medicine

## 2013-12-10 LAB — COMPREHENSIVE METABOLIC PANEL
ALBUMIN: 3.5 g/dL (ref 3.4–5.0)
Alkaline Phosphatase: 85 U/L
Anion Gap: 5 — ABNORMAL LOW (ref 7–16)
BUN: 10 mg/dL (ref 7–18)
Bilirubin,Total: 0.3 mg/dL (ref 0.2–1.0)
Calcium, Total: 8.9 mg/dL (ref 8.5–10.1)
Chloride: 100 mmol/L (ref 98–107)
Co2: 30 mmol/L (ref 21–32)
Creatinine: 0.82 mg/dL (ref 0.60–1.30)
EGFR (African American): >60
EGFR (Non-African Amer.): >60
GLUCOSE: 120 mg/dL — AB (ref 65–99)
Osmolality: 270 (ref 275–301)
Potassium: 3.7 mmol/L (ref 3.5–5.1)
SGOT(AST): 28 U/L (ref 15–37)
SGPT (ALT): 19 U/L (ref 12–78)
Sodium: 135 mmol/L — ABNORMAL LOW (ref 136–145)
Total Protein: 8.7 g/dL — ABNORMAL HIGH (ref 6.4–8.2)

## 2013-12-10 LAB — LIPASE, BLOOD: Lipase: 97 U/L (ref 73–393)

## 2013-12-10 LAB — URINALYSIS, COMPLETE
BACTERIA: NONE SEEN
BLOOD: NEGATIVE
Bilirubin,UR: NEGATIVE
Glucose,UR: NEGATIVE mg/dL (ref 0–75)
Leukocyte Esterase: NEGATIVE
Nitrite: NEGATIVE
Ph: 5 (ref 4.5–8.0)
RBC,UR: 3 /HPF (ref 0–5)
Specific Gravity: 1.025 (ref 1.003–1.030)
WBC UR: 1 /HPF (ref 0–5)

## 2013-12-10 LAB — CBC
HCT: 42.4 % (ref 35.0–47.0)
HGB: 14.3 g/dL (ref 12.0–16.0)
MCH: 28.6 pg (ref 26.0–34.0)
MCHC: 33.8 g/dL (ref 32.0–36.0)
MCV: 84 fL (ref 80–100)
Platelet: 326 10*3/uL (ref 150–440)
RBC: 5.02 10*6/uL (ref 3.80–5.20)
RDW: 14.1 % (ref 11.5–14.5)
WBC: 7.7 10*3/uL (ref 3.6–11.0)

## 2013-12-31 DIAGNOSIS — Z96659 Presence of unspecified artificial knee joint: Secondary | ICD-10-CM | POA: Insufficient documentation

## 2013-12-31 DIAGNOSIS — J45909 Unspecified asthma, uncomplicated: Secondary | ICD-10-CM | POA: Insufficient documentation

## 2014-01-14 ENCOUNTER — Ambulatory Visit: Payer: Medicare Other | Admitting: Pulmonary Disease

## 2014-01-31 ENCOUNTER — Telehealth: Payer: Self-pay | Admitting: Pulmonary Disease

## 2014-01-31 NOTE — Telephone Encounter (Signed)
Pt aware of recs pr BQ. Pt has appt with BQ on Monday 02/03/14 in [place]. Pt advised to continue meds as prescribed until visit on Monday Advised to seek help at ED over the weekend if symptoms worsen.  Pt expressed understanding. Nothing further needed.

## 2014-01-31 NOTE — Telephone Encounter (Signed)
Without seeing her I can't recommend anything on the phone.  If she has significant shortness of breath then she needs to be seen by someone between now and our visit.

## 2014-01-31 NOTE — Telephone Encounter (Signed)
Spoke with pt, states she was hospitalized recently and has been SOB since returning from the hospital.  Has been using her ProAir 2X/daily to help with SOB, which is worse with exertion.  She has an appt on Monday with BQ.  She wants to know if there are any recommendations between now and her appt on Monday to help with the SOB.

## 2014-02-03 ENCOUNTER — Encounter: Payer: Self-pay | Admitting: Pulmonary Disease

## 2014-02-03 ENCOUNTER — Ambulatory Visit (INDEPENDENT_AMBULATORY_CARE_PROVIDER_SITE_OTHER): Payer: Medicare Other | Admitting: Pulmonary Disease

## 2014-02-03 VITALS — BP 130/72 | HR 75 | Ht 68.0 in | Wt 231.0 lb

## 2014-02-03 DIAGNOSIS — R0602 Shortness of breath: Secondary | ICD-10-CM

## 2014-02-03 DIAGNOSIS — J45909 Unspecified asthma, uncomplicated: Secondary | ICD-10-CM

## 2014-02-03 DIAGNOSIS — R918 Other nonspecific abnormal finding of lung field: Secondary | ICD-10-CM

## 2014-02-03 DIAGNOSIS — J8410 Pulmonary fibrosis, unspecified: Secondary | ICD-10-CM

## 2014-02-03 NOTE — Assessment & Plan Note (Addendum)
Repeat CT chest 2015, will order next visit

## 2014-02-03 NOTE — Progress Notes (Signed)
Subjective:    Patient ID: Nicole Ballard, female    DOB: 14-Dec-1955, 58 y.o.   MRN: [id number]  Synopsis: Nicole Ballard first saw the [facility] office in February 2014 for evaluation of shortness of breath and wheezing.  She had been hospitalized at [facility] in January 2014 for "possible pneumonia" and had a CXR with intersitial opacities and bilateral hilar adenopathy.  She was diagnosed with Lupus in 2005 by [facility] and was treated with Plaquenil.  She has also been diagnosed with vocal cord dysfunction by [facility] and in Feb 2014 was undergoing behavioral therapy.  11/2012 PFT's at [facility] showed moderate to severe restriction with a depressed ERV and DLCO. 11/2012 CT chest just showed resolving post infectious changes and no clear lymphadenopathy or interstitial lung disease.  Vocal cord dysfunction behavioral therapy at [facility] has been helpful.  HPI  02/03/2014 routine office visit> [name] had a knee replacement several months ago and ever since then she's been having increasing shortness of breath. She went back to [facility] and had a CT scan of her chest which was negative for pulmonary embolism. She says that her shortness of breath has improved gradually since then but she still feels short of breath with minimal activity in the house. Just walking a few feet or carrying out activities of daily living such as taking a shower makes her short of breath. She says that sometimes she short of breath at rest. She has not had a change in cough. She does not have orthopnea. She sometimes feels chest tightness. She has been taking Symbicort 2 puffs in the morning only. She says that this seems to help a little bit. She has not had wheezing. She has not been outside or had increasing sinus congestion lately.  Past Medical History  Diagnosis Date  . Fibromyalgia   . Paresthesia   . GERD (gastroesophageal reflux disease)   . Rhinitis   . Systemic lupus erythematosus   . Hemifacial  spasm   . Chronic kidney disease   . Chronic pain   . Anxiety   . Vitamin D deficiency   . Bipolar 1 disorder   . Hypertension   . Osteoarthritis   . Lumbago   . Insomnia   . Hyperlipidemia   . Hearing loss of right ear      Review of Systems  Constitutional: Negative for fever, chills and unexpected weight change.  HENT: Negative for congestion, postnasal drip, rhinorrhea and sneezing.   Respiratory: Positive for shortness of breath. Negative for cough, chest tightness and wheezing.   Cardiovascular: Negative for chest pain and leg swelling.       Objective:   Physical Exam  Filed Vitals:   02/03/14 1054  BP: 130/72  Pulse: 75  Height: 5\' 8"  (1.727 m)  Weight: 231 lb (104.781 kg)  SpO2: 100%   room air  Gen: obese, well appearing, no acute distress HEENT: NCAT, OP clear,  Eyes: PERRL, EOMi,  PULM: CTA B CV: RRR, no mgr, no JVD Ext: warm, no edema, no clubbing, no cyanosis    January 2014 CXR > increased interstitial markings bilaterally, mediastinal lymphadenopathy 11/20/2012 Full PFT > ratio 77%, FEV1 1.57 L (57% predicted) total lung capacity 3.52 L (61% predicted), ERV 0.29 L (24% predicted), DLCO 16.6 (54% predicted) 11/23/12 CT chest >> no mediastinal lymphadenopathy; some increased interstitial markings and scattered ggo in R lung favored to be post infectious; few scattered pulmonary nodules, the largest of which is 40mm; some  air trapping in bases     Assessment & Plan:   Postinflammatory pulmonary fibrosis We never found convincing evidence of interstitial lung disease on high-risk CT chest. Further, her lungs are clear as is her oxygenation today.  I do not think that her shortness of breath is do to a fibrotic process.  Pulmonary nodules Repeat CT chest 2015, will order next visit  Asthma It is not clear to me that her shortness of breath is due to asthma. I believe that the most likely cause of shortness of breath is deconditioning and  obesity.  I still think that Symbicort is too much medicine for her asthma considering that I believe she really only has mild intermittent or maybe at worst mild persistent.  Plan: -For now continue Symbicort, educated on proper use -Repeat full pulmonary function test -Check chest x-ray -If no clear sign of pulmonary disease after pulmonary function testing and chest x-ray then were a stress test.    Updated Medication List Outpatient Encounter Prescriptions as of 02/03/2014  Medication Sig  . albuterol (PROAIR HFA) 108 (90 BASE) MCG/ACT inhaler Inhale 2 puffs into the lungs every 6 (six) hours as needed for wheezing.  Marland Kitchen ALPRAZolam (XANAX) 0.5 MG tablet 2 tablets three times daily  . budesonide-formoterol (SYMBICORT) 160-4.5 MCG/ACT inhaler Inhale 2 puffs into the lungs 2 (two) times daily as needed.   . Cholecalciferol (VITAMIN D) 2000 UNITS CAPS Take 1 capsule by mouth daily.  . clobetasol cream (TEMOVATE) 6.80 % Apply 1 application topically 2 (two) times daily.  . DULoxetine (CYMBALTA) 60 MG capsule Take 120 mg by mouth at bedtime.   . fluticasone (FLONASE) 50 MCG/ACT nasal spray Place 2 sprays into the nose daily as needed.   . hydrochlorothiazide (HYDRODIURIL) 25 MG tablet Take 25 mg by mouth daily.  . hydroxychloroquine (PLAQUENIL) 200 MG tablet Take 200 mg by mouth 2 (two) times daily.  Marland Kitchen losartan (COZAAR) 100 MG tablet Take 100 mg by mouth daily.  . Multiple Vitamin (MULTIVITAMIN) tablet Take 1 tablet by mouth daily.  Marland Kitchen omeprazole (PRILOSEC) 40 MG capsule Take 40 mg by mouth daily.  . polyethylene glycol-electrolytes (NULYTELY/GOLYTELY) 420 G solution Take 4,000 mLs by mouth 3 (three) times daily.   Marland Kitchen selenium sulfide (SELSUN) 2.5 % shampoo Apply 1 application topically daily as needed for itching.  . traZODone (DESYREL) 150 MG tablet Take 150 mg by mouth at bedtime.  . cyclobenzaprine (FLEXERIL) 10 MG tablet Take 10 mg by mouth 3 (three) times daily as needed for muscle  spasms.  . [DISCONTINUED] hydrOXYzine (ATARAX/VISTARIL) 25 MG tablet Take 25 mg by mouth at bedtime as needed for itching.

## 2014-02-03 NOTE — Assessment & Plan Note (Signed)
We never found convincing evidence of interstitial lung disease on high-risk CT chest. Further, her lungs are clear as is her oxygenation today.  I do not think that her shortness of breath is do to a fibrotic process.

## 2014-02-03 NOTE — Patient Instructions (Signed)
Continue taking the Symbicort 2 puffs twice a day We will order lung function tests and a Chest X-ray at [facility] If those are normal we will order a stress test  Stay active We will see you back in 2 months or sooner if needed

## 2014-02-03 NOTE — Assessment & Plan Note (Signed)
It is not clear to me that her shortness of breath is due to asthma. I believe that the most likely cause of shortness of breath is deconditioning and obesity.  I still think that Symbicort is too much medicine for her asthma considering that I believe she really only has mild intermittent or maybe at worst mild persistent.  Plan: -For now continue Symbicort, educated on proper use -Repeat full pulmonary function test -Check chest x-ray -If no clear sign of pulmonary disease after pulmonary function testing and chest x-ray then were a stress test.

## 2014-02-04 ENCOUNTER — Ambulatory Visit: Payer: Self-pay | Admitting: Pulmonary Disease

## 2014-02-13 ENCOUNTER — Ambulatory Visit: Payer: Self-pay | Admitting: Pulmonary Disease

## 2014-02-13 ENCOUNTER — Telehealth: Payer: Self-pay | Admitting: Pulmonary Disease

## 2014-02-13 LAB — PULMONARY FUNCTION TEST

## 2014-02-13 NOTE — Telephone Encounter (Signed)
Called and spoke with [facility] and they are aware that per last ov note from BQ---repeat the cxr and do the PFT.  They will repeat the cxr today and nothing further is needed.

## 2014-02-17 ENCOUNTER — Telehealth: Payer: Self-pay | Admitting: Pulmonary Disease

## 2014-02-17 NOTE — Telephone Encounter (Signed)
Called and spoke to pt. Pt stated she has an increase in dyspnea with little ambulation. Pt has also had an increase in cough with white mucous production and mid-sternal pain only when coughing. Pt has been using her Symbicort 160- 2 puffs BID and has not been using her albuterol inhaler at all. Pt was advised to use rescue inhaler if she is unable to catch her breath by resting. Pt verbalized understanding. Pt was able to form complete sentences without dyspnea while on phone. MW please advise on how to continue.   Allergies  Allergen Reactions  . Albuterol     Shakiness, tachycardia   . Penicillins

## 2014-02-17 NOTE — Telephone Encounter (Signed)
Pt aware of recs per MW  Pt going to call back in a little bit to get directions on how to get here--pt lives in [place]. Pt does not have GPS to use--will need good thorough directions.

## 2014-02-17 NOTE — Telephone Encounter (Signed)
Nothing else to offer over the phone > I'd be happy to see her this afternoon or next opening with all meds in hand

## 2014-02-18 ENCOUNTER — Telehealth: Payer: Self-pay

## 2014-02-18 ENCOUNTER — Encounter: Payer: Self-pay | Admitting: Pulmonary Disease

## 2014-02-18 DIAGNOSIS — R0600 Dyspnea, unspecified: Secondary | ICD-10-CM

## 2014-02-18 NOTE — Telephone Encounter (Signed)
Pt returned call & asks to be reached at [phone].  Nicole Ballard

## 2014-02-18 NOTE — Telephone Encounter (Signed)
Message copied by Len Blalock on Tue Feb 18, 2014 10:47 AM ------      Message from: Juanito Doom      Created: Tue Feb 18, 2014  5:26 AM       A,            Please let her know that the only change related to her PFT was a mild decrease in her ability to take a deep breath which is likely related to her weight.            If she is still having shortness of breath then she needs to have a cardiac stress test through Dr. Donivan Scull office.            Thanks      B ------

## 2014-02-18 NOTE — Telephone Encounter (Signed)
A,   Please let her know that her CXR was normal   Thanks  B   --------------------------------  ATC X1 to relay results.  Line was cut off.  Called back, line rang X3, then went dead.  WCB.

## 2014-02-18 NOTE — Telephone Encounter (Signed)
Spoke with pt, she is aware of results and recs.  Pt is ok with scheduling the cardiac stress test.  This has been ordered.  Nothing further needed at this time.  Forwarding to BQ as an fyi that this has been ordered.

## 2014-02-19 ENCOUNTER — Telehealth: Payer: Self-pay | Admitting: Pulmonary Disease

## 2014-02-19 NOTE — Telephone Encounter (Signed)
I called spoke with pt. She had knee surgery 12/31/13. She is going to check with surgeon to make sure it is okay for her to have stress test done. She will let us know if not. Nothing further needed

## 2014-02-19 NOTE — Telephone Encounter (Signed)
Pt aware of address. Nothing further needed

## 2014-02-20 ENCOUNTER — Telehealth: Payer: Self-pay

## 2014-02-20 DIAGNOSIS — J8410 Pulmonary fibrosis, unspecified: Secondary | ICD-10-CM

## 2014-02-20 NOTE — Telephone Encounter (Signed)
Spoke with Dr. Lake Bells to verify that she needs an exercize nuclear stress test.  This has been ordered for [facility].  LMTCB X1 at cardiology to verify that this is the correct order.  Keeping open until verified with cardiology.

## 2014-02-20 NOTE — Telephone Encounter (Signed)
Spoke with  Olivia Mackie at cardiology to clarify what type of stress test needs to be ordered.  BQ is wanting just a cardiac stress test.  She believes that the pt needs a consultation with a provider before the stress test.  She is going to verify this with office and call our office back later today to see if we need to put in a referral for a consult.  Keeping this note open until this is handled accordingly.

## 2014-02-21 NOTE — Telephone Encounter (Signed)
This has been ordered.  Nothing further needed at this time.

## 2014-02-24 ENCOUNTER — Telehealth: Payer: Self-pay | Admitting: Pulmonary Disease

## 2014-02-24 MED ORDER — BUDESONIDE-FORMOTEROL FUMARATE 160-4.5 MCG/ACT IN AERO: 2.0000 | INHALATION_SPRAY | Freq: Two times a day (BID) | RESPIRATORY_TRACT | Status: DC | PRN

## 2014-02-24 NOTE — Telephone Encounter (Signed)
Spoke with pt  Refill was sent for symbicort  Pt reports found out there is black mold in her home She is asking if this could affect her breathing  I advised it can if she is allergic to this  Will forward to BQ for further recs  Please advise thanks!

## 2014-02-25 ENCOUNTER — Telehealth (HOSPITAL_COMMUNITY): Payer: Self-pay

## 2014-02-25 ENCOUNTER — Telehealth: Payer: Self-pay | Admitting: Pulmonary Disease

## 2014-02-25 NOTE — Telephone Encounter (Signed)
Spoke with Gabriel Cirri  She states pt needing cardiac stress test  This is already scheduled  Nothing further needed

## 2014-02-26 ENCOUNTER — Ambulatory Visit (HOSPITAL_COMMUNITY)
Admission: RE | Admit: 2014-02-26 | Discharge: 2014-02-26 | Disposition: A | Discharge status: Home / Self Care | Payer: Medicare Other | Source: Ambulatory Visit | Attending: Cardiology | Admitting: Cardiology

## 2014-02-26 DIAGNOSIS — R002 Palpitations: Secondary | ICD-10-CM | POA: Insufficient documentation

## 2014-02-26 DIAGNOSIS — R079 Chest pain, unspecified: Secondary | ICD-10-CM | POA: Insufficient documentation

## 2014-02-26 DIAGNOSIS — R0602 Shortness of breath: Secondary | ICD-10-CM | POA: Insufficient documentation

## 2014-02-26 DIAGNOSIS — J8410 Pulmonary fibrosis, unspecified: Secondary | ICD-10-CM

## 2014-02-26 DIAGNOSIS — J45909 Unspecified asthma, uncomplicated: Secondary | ICD-10-CM | POA: Insufficient documentation

## 2014-02-26 MED ORDER — TECHNETIUM TC 99M SESTAMIBI GENERIC - CARDIOLITE
10.0000 | Freq: Once | INTRAVENOUS | Status: AC | PRN
  Administered 2014-02-26: 10 via INTRAVENOUS

## 2014-02-26 MED ORDER — TECHNETIUM TC 99M SESTAMIBI GENERIC - CARDIOLITE
30.0000 | Freq: Once | INTRAVENOUS | Status: AC | PRN
  Administered 2014-02-26: 30 via INTRAVENOUS

## 2014-02-26 NOTE — Procedures (Addendum)
[place] [address] [place] [address] [phone]  Cardiology Nuclear Med Study  Nicole Ballard is a 58 y.o. female     MRN : [id number]     DOB: [birth year]  Procedure Date: 02/26/2014  Nuclear Med Background Indication for Stress Test:  Evaluation for Ischemia History:  Asthma and No prior cardiac history reported;No prior NUC MPI for comparison;Lupus Cardiac Risk Factors: Family History - CAD, Hypertension, Lipids and Obesity  Symptoms:  Chest Pain, Palpitations, SOB and back pain   Nuclear Pre-Procedure Caffeine/Decaff Intake:  9:00pm NPO After: 7:00am   IV Site: R Forearm  IV 0.9% NS with Angio Cath:  22g  Chest Size (in):  n/a IV Started by: Rolene Course, RN  Height: 5\' 8"  (1.727 m)  Cup Size: D  BMI:  Body mass index is 35.13 kg/(m^2). Weight:  231 lb (104.781 kg)   Tech Comments:  n/a    Nuclear Med Study 1 or 2 day study: 1 day  Stress Test Type:  Stress  Order Authorizing Provider:  Simonne Maffucci, MD   Resting Radionuclide: Technetium 34m Sestamibi  Resting Radionuclide Dose: 10.4 mCi   Stress Radionuclide:  Technetium 71m Sestamibi  Stress Radionuclide Dose: 29.1 mCi           Stress Protocol Rest HR: 81 Stress HR:141  Rest BP: 123/77 Stress BP: 165/77  Exercise Time (min): 9:30 METS: 10.10   Predicted Max HR: 164 bpm % Max HR: 85.98 bpm Rate Pressure Product: 40102  Dose of Adenosine (mg):  n/a Dose of Lexiscan: n/a mg  Dose of Atropine (mg): n/a Dose of Dobutamine: n/a mcg/kg/min (at max HR)  Stress Test Technologist: Mellody Memos, CCT Nuclear Technologist: Otho Perl, CNMT   Rest Procedure:  Myocardial perfusion imaging was performed at rest 45 minutes following the intravenous administration of Technetium 70m Sestamibi. Stress Procedure:  The patient performed treadmill exercise using a Bruce  Protocol for 9 minutes 30 seconds. The patient stopped due to generalized fatigue.  Patient denied any chest pain.  There were no significant ST-T wave changes.  Technetium 55m Sestamibi was injected at peak exercise and myocardial perfusion imaging was performed after a brief delay.  Transient Ischemic Dilatation (Normal <1.22):  1.14 QGS EDV:  104 ml QGS ESV:  39 ml LV Ejection Fraction: 63%      Rest ECG: NSR - Normal EKG  Stress ECG: No significant change from baseline ECG and occasional isolated PVC in recovery  QPS Raw Data Images:  Normal; no motion artifact; normal heart/lung ratio. Stress Images:  Normal homogeneous uptake in all areas of the myocardium. Rest Images:  Normal homogeneous uptake in all areas of the myocardium. Subtraction (SDS):  Normal  Impression Exercise Capacity:  Good exercise capacity. BP Response:  Normal blood pressure response. Clinical Symptoms:  No significant symptoms noted. ECG Impression:  No significant ST segment change suggestive of ischemia. Comparison with Prior Nuclear Study: No previous nuclear study performed  Overall Impression:  Normal stress nuclear study.  LV Wall Motion:  NL LV Function, EF 63%; NL Wall Motion   KELLY,THOMAS A, MD  02/26/2014 1:01 PM

## 2014-03-03 NOTE — Telephone Encounter (Signed)
This could matter Remind her to bring it up on the next visit

## 2014-03-04 NOTE — Telephone Encounter (Signed)
appt schedule here in [place] office 7/29 with BQ-- requested something sooner than 1st available in [facility] (8/27) Pt states that she has taken pictures of the black mold and will bring them with her to her appt. Pt advised to contact our office sooner if symptoms worsen.  Pt wanting to know if ECHO results have been received/reviewed.  Please advise if you have seen these results. Thanks.

## 2014-03-05 ENCOUNTER — Encounter: Payer: Self-pay | Admitting: Pulmonary Disease

## 2014-03-11 ENCOUNTER — Other Ambulatory Visit: Payer: Medicare Other

## 2014-03-12 NOTE — Telephone Encounter (Signed)
I can't see where I ordered an echo, nor do I have results from one

## 2014-03-12 NOTE — Telephone Encounter (Signed)
BQ please advise if you have reviewed the echo results thanks.

## 2014-03-12 NOTE — Telephone Encounter (Signed)
lmomtcb x1 

## 2014-03-12 NOTE — Telephone Encounter (Signed)
Not sure where the echo came from. She had a nuclear stress test (which is what I wanted her to have) and it was normal, please communicate this to her. Can't see where she had an echo, nor do I want her to have one.

## 2014-03-12 NOTE — Telephone Encounter (Signed)
According to EPIC echo stress w/o contrast was ordered 02/18/14. In the media tab results are scanned in dated 02/26/14. Please advise Dr. Lake Bells thanks

## 2014-03-13 LAB — HM PAP SMEAR: HM Pap smear: NORMAL

## 2014-03-13 NOTE — Telephone Encounter (Signed)
[place]

## 2014-03-13 NOTE — Telephone Encounter (Signed)
I spoke with patient about results and she verbalized understanding and had no questions 

## 2014-03-20 NOTE — Telephone Encounter (Signed)
Encounter complete. 

## 2014-04-02 ENCOUNTER — Ambulatory Visit: Payer: Medicare Other | Admitting: Pulmonary Disease

## 2014-05-08 ENCOUNTER — Ambulatory Visit: Payer: Medicare Other | Admitting: Pulmonary Disease

## 2014-05-20 ENCOUNTER — Encounter: Payer: Self-pay | Admitting: Pulmonary Disease

## 2014-05-20 ENCOUNTER — Ambulatory Visit (INDEPENDENT_AMBULATORY_CARE_PROVIDER_SITE_OTHER): Payer: Medicare Other | Admitting: Pulmonary Disease

## 2014-05-20 ENCOUNTER — Ambulatory Visit (INDEPENDENT_AMBULATORY_CARE_PROVIDER_SITE_OTHER)
Admission: RE | Admit: 2014-05-20 | Discharge: 2014-05-20 | Disposition: A | Discharge status: Home / Self Care | Payer: Medicare Other | Source: Ambulatory Visit | Attending: Pulmonary Disease | Admitting: Pulmonary Disease

## 2014-05-20 VITALS — BP 118/70 | HR 74 | Ht 68.0 in | Wt 235.0 lb

## 2014-05-20 DIAGNOSIS — J8410 Pulmonary fibrosis, unspecified: Secondary | ICD-10-CM

## 2014-05-20 DIAGNOSIS — R05 Cough: Secondary | ICD-10-CM

## 2014-05-20 DIAGNOSIS — J4520 Mild intermittent asthma, uncomplicated: Secondary | ICD-10-CM

## 2014-05-20 DIAGNOSIS — R059 Cough, unspecified: Secondary | ICD-10-CM

## 2014-05-20 DIAGNOSIS — R0602 Shortness of breath: Secondary | ICD-10-CM

## 2014-05-20 DIAGNOSIS — J383 Other diseases of vocal cords: Secondary | ICD-10-CM

## 2014-05-20 DIAGNOSIS — J45909 Unspecified asthma, uncomplicated: Secondary | ICD-10-CM

## 2014-05-20 DIAGNOSIS — R918 Other nonspecific abnormal finding of lung field: Secondary | ICD-10-CM

## 2014-05-20 MED ORDER — BENZONATATE 200 MG PO CAPS: 200.0000 mg | ORAL_CAPSULE | Freq: Three times a day (TID) | ORAL | Status: DC | PRN

## 2014-05-20 NOTE — Assessment & Plan Note (Signed)
This problem persists despite a fairly negative workup until this point.  She has had PFTs that only showed restriction due to obesity and no evidence of asthma. Her CT chest last year showed no evidence of pulmonary fibrosis.  Her stress test this year was normal.  I explained to her today that the most likely etiology of her dyspnea is obesity, deconditioning and vocal cord dysfunction.    Despite her complaint of dyspnea she has a normal pulmonary exam and her oxygenation and vital signs are completely normal.  Plan: -for completeness will obtain echocardiogram to look for pulmonary hypertension -CXR today due to ongoing dyspnea -f/u CT chest results for dyspnea -GET Vocal cord dysfunction treated

## 2014-05-20 NOTE — Assessment & Plan Note (Signed)
No convincing evidence of this seen on CT chest in 2014. Further, repeat PFTs have shown no progression of restrictive defect felt to be due to obesity.  Will f/u CT chest ordered for nodules.

## 2014-05-20 NOTE — Assessment & Plan Note (Signed)
I will order another CT scan to evaluate the 44mm nodules seen last year.

## 2014-05-20 NOTE — Assessment & Plan Note (Signed)
Due to vocal cord problems.  Will check a CXR today for completeness.    Have encouraged GERD treatment and work up (as above) and Rx'd tessalon with instructions on voice rest to allow her vocal cords to heal.

## 2014-05-20 NOTE — Patient Instructions (Signed)
You need to try to suppress your cough to allow your larynx (voice box) to heal.  For three days don't talk, laugh, sing, or clear your throat. Do everything you can to suppress the cough during this time. Use hard candies (sugarless Jolly Ranchers) or non-mint or non-menthol containing cough drops during this time to soothe your throat.  Use a cough suppressant (tessalone) around the clock during this time.  After three days, gradually increase the use of your voice and back off on the cough suppressants. \ Follow the GERD lifestyle sheet we gave you   You can stop taking the symbicort and only use the albuterol as needed  We will order a CXR and echocardiogram to evaluate your shortness of breath  We will see you back in 3 months or sooner if needed

## 2014-05-20 NOTE — Assessment & Plan Note (Signed)
I see no convincing evidence of this on two PFTs.  I recommended that she stop the symbicort and can use albuterol prn.

## 2014-05-20 NOTE — Progress Notes (Signed)
Subjective:    Patient ID: Nicole Ballard, female    DOB: 05/15/1956, 58 y.o.   MRN: [id number]  Synopsis: Nicole Ballard first saw the [facility] office in February 2014 for evaluation of shortness of breath and wheezing.  She had been hospitalized at [facility] in January 2014 for "possible pneumonia" and had a CXR with intersitial opacities and bilateral hilar adenopathy.  She was diagnosed with Lupus in 2005 by [facility] Rhe[facility] and was treated with Plaquenil.  She has also been diagnosed with vocal cord dysfunction by [facility] and in Feb 2014 was undergoing behavioral therapy.  11/2012 PFT's at [facility] showed moderate to severe restriction with a depressed ERV and DLCO. 11/2012 CT chest just showed resolving post infectious changes and no clear lymphadenopathy or interstitial lung disease.  Vocal cord dysfunction behavioral therapy at [facility] has been helpful.  HPI  05/20/14 ROV> Nicole Ballard says that she has still been short of breath lately.  She says that she gets short of breath with walking from the bedroom to her kitchen she was getting dyspnic.  This was worse a few months ago, but it appears to be getting better since then.  She had a hospitalization for dyspnea and she was given breathing treatments.  She was only there for a few hours.  She says that she feels difficulty breathing at times but she has a really hard time describing her symptoms.  She notes that sometimes at rest she feels like she is not getting enough air.  She's using the symbicort regularly and she tries to avoid using albuterol.  She is to have a laryngscopy and a 24 pH probe at [facility].  She has been having more regurgitation of food. She has ben coughing more and has been coughing up.  Regurgitation makes this worse.    Past Medical History  Diagnosis Date  . Fibromyalgia   . Paresthesia   . GERD (gastroesophageal reflux disease)   . Rhinitis   . Systemic lupus erythematosus   . Hemifacial spasm   . Chronic kidney disease    . Chronic pain   . Anxiety   . Vitamin D deficiency   . Bipolar 1 disorder   . Hypertension   . Osteoarthritis   . Lumbago   . Insomnia   . Hyperlipidemia   . Hearing loss of right ear      Review of Systems  Constitutional: Negative for fever, chills and unexpected weight change.  HENT: Negative for congestion, postnasal drip, rhinorrhea and sneezing.   Respiratory: Positive for cough and shortness of breath. Negative for chest tightness and wheezing.   Cardiovascular: Negative for chest pain and leg swelling.       Objective:   Physical Exam  Filed Vitals:   05/20/14 0910  BP: 118/70  Pulse: 74  Height: 5\' 8"  (1.727 m)  Weight: 235 lb (106.595 kg)  SpO2: 100%   room air  Gen: obese, well appearing, no acute distress HEENT: NCAT, OP clear, EOMi PULM: CTA B CV: RRR, no mgr, no JVD AB: BS+, soft, nontender Ext: warm, no edema, no clubbing, no cyanosis Neuro: A&Ox4, maew    January 2014 CXR > increased interstitial markings bilaterally, mediastinal lymphadenopathy 11/20/2012 Full PFT > ratio 77%, FEV1 1.57 L (57% predicted) total lung capacity 3.52 L (61% predicted), ERV 0.29 L (24% predicted), DLCO 16.6 (54% predicted) 11/23/12 CT chest >> no mediastinal lymphadenopathy; some increased interstitial markings and scattered ggo in R lung favored to be post  infectious; few scattered pulmonary nodules, the largest of which is 66mm; some air trapping in bases 11/20/2012 Full PFT at [facility] > Ratio 77%, FEV1 1.57 (57%) FEF 25-75 1.36 (45%); TLC 3.52 L (61% pred), ERV 0.29 (24% pred), DLCO 16.6 (54% pred); Flow volume loop consistent with obstruction, favor small airways disease 02/2014 PFT [facility] > Ratio 83%, FEV1 1.67L (61% pred), TLC 2.81 L (49% pred), ERV 0.36L (30% pred), DLCO 16.9 (61% pred) 03/2014 Nuclear stress test> normal     Assessment & Plan:   Pulmonary nodules I will order another CT scan to evaluate the 24mm nodules seen last year.    Postinflammatory pulmonary  fibrosis No convincing evidence of this seen on CT chest in 2014. Further, repeat PFTs have shown no progression of restrictive defect felt to be due to obesity.  Will f/u CT chest ordered for nodules.  Shortness of breath This problem persists despite a fairly negative workup until this point.  She has had PFTs that only showed restriction due to obesity and no evidence of asthma. Her CT chest last year showed no evidence of pulmonary fibrosis.  Her stress test this year was normal.  I explained to her today that the most likely etiology of her dyspnea is obesity, deconditioning and vocal cord dysfunction.    Despite her complaint of dyspnea she has a normal pulmonary exam and her oxygenation and vital signs are completely normal.  Plan: -for completeness will obtain echocardiogram to look for pulmonary hypertension -CXR today due to ongoing dyspnea -f/u CT chest results for dyspnea -GET Vocal cord dysfunction treated  Asthma I see no convincing evidence of this on two PFTs.  I recommended that she stop the symbicort and can use albuterol prn.  Vocal cord dysfunction Again, no convincing evidence of asthma on PFT x2.  She has known VCD and we discuss this every visit.  Lately she has been having more GERD which is exacerbating this.   SHE NEEDS TO GO BACK TO GET BEHAVIORAL THERAPY FOR THIS AT [facility].  I explained this to her AGAIN today but she continues to fail to do this.  Plan: -keep appointment for 24 hr pH probe (esophageal impendence may be more helpful considering ppi) -continue PPI -GERD lifestyle changes reviewed -f/u with [facility]NT  Cough Due to vocal cord problems.  Will check a CXR today for completeness.    Have encouraged GERD treatment and work up (as above) and Rx'd tessalon with instructions on voice rest to allow her vocal cords to heal.    Updated Medication List Outpatient Encounter Prescriptions as of 05/20/2014  Medication Sig  . albuterol (PROAIR HFA) 108 (90  BASE) MCG/ACT inhaler Inhale 2 puffs into the lungs every 6 (six) hours as needed for wheezing.  . budesonide-formoterol (SYMBICORT) 160-4.5 MCG/ACT inhaler Inhale 2 puffs into the lungs 2 (two) times daily as needed.  . clobetasol cream (TEMOVATE) 6.19 % Apply 1 application topically 2 (two) times daily.  . clonazePAM (KLONOPIN) 1 MG tablet Take 1 mg by mouth 2 (two) times daily as needed for anxiety.  . DULoxetine (CYMBALTA) 60 MG capsule Take 120 mg by mouth at bedtime.   . fluticasone (FLONASE) 50 MCG/ACT nasal spray Place 2 sprays into the nose daily as needed.   . hydrochlorothiazide (HYDRODIURIL) 25 MG tablet Take 25 mg by mouth daily.  . hydroxychloroquine (PLAQUENIL) 200 MG tablet Take 200 mg by mouth 2 (two) times daily.  Marland Kitchen losartan (COZAAR) 100 MG tablet Take 100 mg  by mouth daily.  . Multiple Vitamin (MULTIVITAMIN) tablet Take 1 tablet by mouth daily.  Marland Kitchen omeprazole (PRILOSEC) 40 MG capsule Take 40 mg by mouth daily.  . polyethylene glycol-electrolytes (NULYTELY/GOLYTELY) 420 G solution Take 4,000 mLs by mouth 3 (three) times daily.   Marland Kitchen selenium sulfide (SELSUN) 2.5 % shampoo Apply 1 application topically daily as needed for itching.  . traZODone (DESYREL) 150 MG tablet Take 150 mg by mouth at bedtime.  . benzonatate (TESSALON) 200 MG capsule Take 1 capsule (200 mg total) by mouth 3 (three) times daily as needed for cough.  . Cholecalciferol (VITAMIN D) 2000 UNITS CAPS Take 1 capsule by mouth daily.  . cyclobenzaprine (FLEXERIL) 10 MG tablet Take 10 mg by mouth 3 (three) times daily as needed for muscle spasms.  . [DISCONTINUED] ALPRAZolam (XANAX) 0.5 MG tablet 2 tablets three times daily

## 2014-05-20 NOTE — Assessment & Plan Note (Signed)
Again, no convincing evidence of asthma on PFT x2.  She has known VCD and we discuss this every visit.  Lately she has been having more GERD which is exacerbating this.   SHE NEEDS TO GO BACK TO GET BEHAVIORAL THERAPY FOR THIS AT [facility].  I explained this to her AGAIN today but she continues to fail to do this.  Plan: -keep appointment for 24 hr pH probe (esophageal impendence may be more helpful considering ppi) -continue PPI -GERD lifestyle changes reviewed -f/u with [facility] ENT

## 2014-05-21 NOTE — Progress Notes (Signed)
Quick Note:  Spoke with pt, she is aware of results and recs. Nothing further needed. ______ 

## 2014-05-23 ENCOUNTER — Other Ambulatory Visit: Payer: Self-pay

## 2014-05-23 MED ORDER — BUDESONIDE-FORMOTEROL FUMARATE 160-4.5 MCG/ACT IN AERO: 2.0000 | INHALATION_SPRAY | Freq: Two times a day (BID) | RESPIRATORY_TRACT | Status: DC | PRN

## 2014-05-28 ENCOUNTER — Ambulatory Visit: Payer: Self-pay | Admitting: Pulmonary Disease

## 2014-05-29 ENCOUNTER — Other Ambulatory Visit (INDEPENDENT_AMBULATORY_CARE_PROVIDER_SITE_OTHER): Payer: Medicare Other

## 2014-05-29 ENCOUNTER — Other Ambulatory Visit: Payer: Self-pay

## 2014-05-29 DIAGNOSIS — R0602 Shortness of breath: Secondary | ICD-10-CM

## 2014-05-31 ENCOUNTER — Encounter: Payer: Self-pay | Admitting: Pulmonary Disease

## 2014-06-01 ENCOUNTER — Encounter: Payer: Self-pay | Admitting: Pulmonary Disease

## 2014-06-02 ENCOUNTER — Telehealth: Payer: Self-pay

## 2014-06-02 DIAGNOSIS — R918 Other nonspecific abnormal finding of lung field: Secondary | ICD-10-CM

## 2014-06-02 NOTE — Telephone Encounter (Signed)
Message copied by Len Blalock on Mon Jun 02, 2014  5:08 PM ------      Message from: Juanito Doom      Created: Sun Jun 01, 2014  3:40 AM       A,            Please let her know that her CT chest showed a small nodule in her right lung.  This is probably just a scar, but the radiologist wanted her to have a repeat CT in 3-6 months.  i would prefer a 3 month repeat with high resolution images read by Dr. Arsenio Loader (reason possible ILD and pulmonary nodule)            Thanks      B ------

## 2014-06-02 NOTE — Telephone Encounter (Signed)
Spoke with pt, she is aware of results and recs.  Order placed for CT in 3 months.  Nothing further needed.

## 2014-06-03 ENCOUNTER — Telehealth: Payer: Self-pay | Admitting: Pulmonary Disease

## 2014-06-03 NOTE — Telephone Encounter (Signed)
Pt is aware of results from CT--pt spoke with ashley about these yesterday but the pt is wanting to know the size of the nodule.  BQ please advise. thanks

## 2014-06-04 ENCOUNTER — Encounter: Payer: Self-pay | Admitting: Pulmonary Disease

## 2014-06-04 NOTE — Telephone Encounter (Signed)
1cm

## 2014-06-05 NOTE — Telephone Encounter (Signed)
Called and spoke to pt. Informed pt of the nodule size. Pt verbalized understanding and denied any further questions or concerns at this time.

## 2014-06-09 ENCOUNTER — Encounter: Payer: Self-pay | Admitting: Pulmonary Disease

## 2014-07-24 LAB — LIPID PANEL
Cholesterol: 194 mg/dL (ref 0–200)
HDL: 52 mg/dL (ref 35–70)
LDL CALC: 127 mg/dL
Triglycerides: 74 mg/dL (ref 40–160)

## 2014-08-27 ENCOUNTER — Ambulatory Visit: Payer: Self-pay | Admitting: Pulmonary Disease

## 2014-08-27 ENCOUNTER — Inpatient Hospital Stay: Admission: RE | Admit: 2014-08-27 | Payer: Medicare Other | Source: Ambulatory Visit

## 2014-09-09 ENCOUNTER — Telehealth: Payer: Self-pay | Admitting: Pulmonary Disease

## 2014-09-09 ENCOUNTER — Ambulatory Visit (INDEPENDENT_AMBULATORY_CARE_PROVIDER_SITE_OTHER): Payer: Medicare Other | Admitting: Pulmonary Disease

## 2014-09-09 ENCOUNTER — Encounter: Payer: Self-pay | Admitting: Pulmonary Disease

## 2014-09-09 ENCOUNTER — Telehealth: Payer: Self-pay

## 2014-09-09 VITALS — BP 116/68 | HR 80 | Ht 68.0 in | Wt 237.0 lb

## 2014-09-09 DIAGNOSIS — J690 Pneumonitis due to inhalation of food and vomit: Secondary | ICD-10-CM | POA: Diagnosis not present

## 2014-09-09 DIAGNOSIS — J383 Other diseases of vocal cords: Secondary | ICD-10-CM

## 2014-09-09 DIAGNOSIS — R918 Other nonspecific abnormal finding of lung field: Secondary | ICD-10-CM | POA: Diagnosis not present

## 2014-09-09 MED ORDER — HYDROCOD POLST-CHLORPHEN POLST 10-8 MG/5ML PO LQCR: 5.0000 mL | Freq: Every evening | ORAL | Status: DC | PRN

## 2014-09-09 NOTE — Telephone Encounter (Signed)
-----   Message from Juanito Doom, MD sent at 09/08/2014  7:51 PM EST ----- A, Please let her know that her CT chest was OK, the nodule is smaller which is great news Thanks B

## 2014-09-09 NOTE — Telephone Encounter (Signed)
Spoke with the pt  She states that she was told by Dr Lake Bells told her at ov today, that her nodule had completely resolved  Then, nurse called and told her that it was still there but smaller  Dr. Anastasia Pall note states that the nodule had nearly resolved on ct and no further scans needed  I explained this to the pt multiple times  She states that she wants to know why she was told it was completely gone and why no f/u is needed  She also wants to know exactly was size it is  Dr Lake Bells, please advise thanks!

## 2014-09-09 NOTE — Progress Notes (Signed)
Subjective:    Patient ID: Nicole Ballard, female    DOB: March 14, 1956, 59 y.o.   MRN: [id number]  Synopsis: Ms. Jurewicz first saw the [facility] Pulmonary office in February 2014 for evaluation of shortness of breath and wheezing.  She had been hospitalized at [facility] in January 2014 for "possible pneumonia" and had a CXR with intersitial opacities and bilateral hilar adenopathy.  She was diagnosed with Lupus in 2005 by [facility] and was treated with Plaquenil.  She has also been diagnosed with vocal cord dysfunction by [facility] ENT and in Feb 2014 was undergoing behavioral therapy.  11/2012 PFT's at [facility] showed moderate to severe restriction with a depressed ERV and DLCO. 11/2012 CT chest just showed resolving post infectious changes and no clear lymphadenopathy or interstitial lung disease.  Vocal cord dysfunction behavioral therapy at [facility] has been helpful.  HPI  Chief Complaint  Patient presents with  . Follow-up    Pt c/o sob with exertion, prod cough with yellow/green mucus X1 week.  Has some sinus congestion, pnd.    Hiawatha says that she has been experiencing ongoing hoarseness. She had a negative pH probe while she was taking the omeprazole at the time.  She apparently has more testing to evaluate her ongoing nausea.  No vomiting, just nausea.  She wants to have an endoscopy.  She has been coughing again worse when she lies flat.  She had a viral syndrome last week with cough, mucus congestion.  She said it was worse last week.  Even when she is feeling well she still coughs a lot at night.  She continues to have mucus congestion.  She denies fever or chills this week, though she thinks she had a fever this week.  She has still not been back to [facility] voice center.  Past Medical History  Diagnosis Date  . Fibromyalgia   . Paresthesia   . GERD (gastroesophageal reflux disease)   . Rhinitis   . Systemic lupus erythematosus   . Hemifacial spasm   . Chronic kidney disease   . Chronic  pain   . Anxiety   . Vitamin D deficiency   . Bipolar 1 disorder   . Hypertension   . Osteoarthritis   . Lumbago   . Insomnia   . Hyperlipidemia   . Hearing loss of right ear      Review of Systems  Constitutional: Negative for fever, chills and unexpected weight change.  HENT: Negative for congestion, postnasal drip, rhinorrhea and sneezing.   Respiratory: Positive for cough. Negative for chest tightness, shortness of breath and wheezing.   Cardiovascular: Negative for chest pain and leg swelling.       Objective:   Physical Exam Filed Vitals:   09/09/14 1026  BP: 116/68  Pulse: 80  Height: 5\' 8"  (1.727 m)  Weight: 237 lb (107.502 kg)  SpO2: 99%   room air  Gen: obese, well appearing, no acute distress HEENT: NCAT, OP clear, EOMi PULM: CTA B CV: RRR, no mgr, no JVD AB: BS+, soft, nontender Ext: warm, no edema, no clubbing, no cyanosis Neuro: A&Ox4, maew    January 2014 CXR > increased interstitial markings bilaterally, mediastinal lymphadenopathy 11/20/2012 Full PFT > ratio 77%, FEV1 1.57 L (57% predicted) total lung capacity 3.52 L (61% predicted), ERV 0.29 L (24% predicted), DLCO 16.6 (54% predicted) 11/23/12 CT chest >> no mediastinal lymphadenopathy; some increased interstitial markings and scattered ggo in R lung favored to be post infectious; few scattered pulmonary  nodules, the largest of which is 24mm; some air trapping in bases 11/20/2012 Full PFT at [facility] > Ratio 77%, FEV1 1.57 (57%) FEF 25-75 1.36 (45%); TLC 3.52 L (61% pred), ERV 0.29 (24% pred), DLCO 16.6 (54% pred);  02/2014 PFT [facility] > Ratio 83%, FEV1 1.67L (61% pred), TLC 2.81 L (49% pred), ERV 0.36L (30% pred), DLCO 16.9 (61% pred) 03/2014 Nuclear stress test> normal 08/2014 CT chest > GGO RLL predominantly, RML nodule smaller; radiology favors chronic aspiration, less likely NSIP or COP    Assessment & Plan:   Aspiration pneumonitis I have reviewed the images from her CT chest extensively with the  radiologist. There is really no evidence of interstitial lung disease but the groundglass opacity seen in the right lung is consistent with aspiration. Further, the fact that she coughs at night, notes significant acid reflux symptoms, and has chronic hoarseness with vocal cord irritation are all consistent with severe gastroesophageal reflux and likely nocturnal aspiration. She is currently following with gastroenterology at [facility] to evaluate this further.   I explained to her today that I do not see evidence of lung disease apart from recurrent aspiration.  Plan:  -continue follow-up with GI -I advised that she elevate the head of her bed  -depending on the results from the GI workup we may want to obtain a modified barium swallow, but I will await their final recommendations.  Vocal cord dysfunction Once again I told her that she needs to go back to the [facility] for evaluation of her vocal cord dysfunction. However, she still has not gone back.  Plan: -Go back to the [facility] for management of vocal cord dysfunction  Pulmonary nodules This nodule had nearly completely resolved on the recent CT chest. She does not need further imaging based on the December 2015 CT chest    Updated Medication List Outpatient Encounter Prescriptions as of 09/09/2014  Medication Sig  . albuterol (PROAIR HFA) 108 (90 BASE) MCG/ACT inhaler Inhale 2 puffs into the lungs every 6 (six) hours as needed for wheezing.  . baclofen (LIORESAL) 10 MG tablet Take 10 mg by mouth 2 (two) times daily.  . Cholecalciferol (VITAMIN D) 2000 UNITS CAPS Take 1 capsule by mouth daily.  . clobetasol cream (TEMOVATE) 9.79 % Apply 1 application topically 2 (two) times daily.  . clonazePAM (KLONOPIN) 1 MG tablet Take 1 mg by mouth. 1 tab qam, 2 tab qhs  . DULoxetine (CYMBALTA) 60 MG capsule Take 120 mg by mouth at bedtime.   . fluticasone (FLONASE) 50 MCG/ACT nasal spray Place 2 sprays into the nose daily as needed.    . hydroxychloroquine (PLAQUENIL) 200 MG tablet Take 200 mg by mouth 2 (two) times daily.  Marland Kitchen losartan-hydrochlorothiazide (HYZAAR) 100-25 MG per tablet Take 1 tablet by mouth daily.  . Multiple Vitamin (MULTIVITAMIN) tablet Take 1 tablet by mouth daily.  Marland Kitchen omeprazole (PRILOSEC) 40 MG capsule Take 40 mg by mouth daily.  . polyethylene glycol-electrolytes (NULYTELY/GOLYTELY) 420 G solution Take 4,000 mLs by mouth 3 (three) times daily.   . traZODone (DESYREL) 150 MG tablet Take 150 mg by mouth at bedtime.  . [DISCONTINUED] budesonide-formoterol (SYMBICORT) 160-4.5 MCG/ACT inhaler Inhale 2 puffs into the lungs 2 (two) times daily as needed.  . [DISCONTINUED] hydrochlorothiazide (HYDRODIURIL) 25 MG tablet Take 25 mg by mouth 2 (two) times daily.   . [DISCONTINUED] losartan (COZAAR) 100 MG tablet Take 100 mg by mouth daily.  . [DISCONTINUED] selenium sulfide (SELSUN) 2.5 % shampoo  Apply 1 application topically daily as needed for itching.  . cyclobenzaprine (FLEXERIL) 10 MG tablet Take 10 mg by mouth 3 (three) times daily as needed for muscle spasms.  . [DISCONTINUED] benzonatate (TESSALON) 200 MG capsule Take 1 capsule (200 mg total) by mouth 3 (three) times daily as needed for cough. (Patient not taking: Reported on 09/09/2014)

## 2014-09-09 NOTE — Assessment & Plan Note (Signed)
This nodule had nearly completely resolved on the recent CT chest. She does not need further imaging based on the December 2015 CT chest

## 2014-09-09 NOTE — Assessment & Plan Note (Signed)
Once again I told her that she needs to go back to the [facility] for evaluation of her vocal cord dysfunction. However, she still has not gone back.  Plan: -Go back to the [facility] for management of vocal cord dysfunction

## 2014-09-09 NOTE — Patient Instructions (Signed)
Take the tussionex at night for the cough.  Because this is a narcotic we will only give on prescription for this.  Don't take it and drive, don't take with other sedating medications or alcohol Prop the head of your bed up Follow up with the voice center at [facility] as well as the GI doctors at [facility] We will see you back in 6 months or sooner if needed

## 2014-09-09 NOTE — Telephone Encounter (Signed)
Pt aware of results.  Nothing further needed.  

## 2014-09-09 NOTE — Assessment & Plan Note (Signed)
I have reviewed the images from her CT chest extensively with the radiologist. There is really no evidence of interstitial lung disease but the groundglass opacity seen in the right lung is consistent with aspiration. Further, the fact that she coughs at night, notes significant acid reflux symptoms, and has chronic hoarseness with vocal cord irritation are all consistent with severe gastroesophageal reflux and likely nocturnal aspiration. She is currently following with gastroenterology at [facility] to evaluate this further.   I explained to her today that I do not see evidence of lung disease apart from recurrent aspiration.  Plan:  -continue follow-up with GI -I advised that she elevate the head of her bed  -depending on the results from the GI workup we may want to obtain a modified barium swallow, but I will await their final recommendations.

## 2014-09-10 NOTE — Telephone Encounter (Signed)
Pt is aware of BQ's response. Nothing further is needed.

## 2014-09-10 NOTE — Telephone Encounter (Signed)
Tell her that the radiologist told me that it has decreased so much that he can't see it anymore so he believes it is gone.

## 2014-09-16 DIAGNOSIS — G4700 Insomnia, unspecified: Secondary | ICD-10-CM | POA: Diagnosis not present

## 2014-09-16 DIAGNOSIS — F411 Generalized anxiety disorder: Secondary | ICD-10-CM | POA: Diagnosis not present

## 2014-09-16 DIAGNOSIS — F410 Panic disorder [episodic paroxysmal anxiety] without agoraphobia: Secondary | ICD-10-CM | POA: Diagnosis not present

## 2014-09-16 DIAGNOSIS — F329 Major depressive disorder, single episode, unspecified: Secondary | ICD-10-CM | POA: Diagnosis not present

## 2014-09-18 DIAGNOSIS — J383 Other diseases of vocal cords: Secondary | ICD-10-CM | POA: Diagnosis not present

## 2014-09-18 DIAGNOSIS — Z6835 Body mass index (BMI) 35.0-35.9, adult: Secondary | ICD-10-CM | POA: Diagnosis not present

## 2014-09-19 ENCOUNTER — Telehealth: Payer: Self-pay | Admitting: Pulmonary Disease

## 2014-09-19 NOTE — Telephone Encounter (Signed)
Spoke with the pt  She states that she was seen by GI doc yesterday at [facility]  She states that GI doc disagreed with BQ and states " he said there was nothing from my stomach coming up into my lungs" She states that there were no changes made at her appt with GI and they advised she f/u with BQ  She states that GI will be faxing ov notes  You should be able to see them in Care Everywhere soon  I told the pt that I would let BQ know to keep an eye out for notes from GI and we will go from there  She verbalized understanding

## 2014-09-22 NOTE — Telephone Encounter (Signed)
noted 

## 2014-09-24 ENCOUNTER — Telehealth: Payer: Self-pay | Admitting: Pulmonary Disease

## 2014-09-24 NOTE — Telephone Encounter (Signed)
In the meantime-[name] did inform me that she has received the records as of this morning and will give to BQ to review. Please let patient know. Thanks.

## 2014-09-24 NOTE — Telephone Encounter (Signed)
LMTCB-when did patient drop off records to [place] office.

## 2014-09-25 ENCOUNTER — Emergency Department: Payer: Self-pay | Admitting: Emergency Medicine

## 2014-09-25 DIAGNOSIS — R110 Nausea: Secondary | ICD-10-CM | POA: Diagnosis not present

## 2014-09-25 DIAGNOSIS — Z880 Allergy status to penicillin: Secondary | ICD-10-CM | POA: Diagnosis not present

## 2014-09-25 DIAGNOSIS — K5900 Constipation, unspecified: Secondary | ICD-10-CM | POA: Diagnosis not present

## 2014-09-30 ENCOUNTER — Telehealth: Payer: Self-pay | Admitting: Pulmonary Disease

## 2014-09-30 MED ORDER — ALBUTEROL SULFATE HFA 108 (90 BASE) MCG/ACT IN AERS: 2.0000 | INHALATION_SPRAY | Freq: Four times a day (QID) | RESPIRATORY_TRACT | Status: DC | PRN

## 2014-09-30 NOTE — Telephone Encounter (Signed)
Pt is aware that we have records and will be given to BQ once he returns to office.

## 2014-09-30 NOTE — Telephone Encounter (Signed)
Spoke with pt, needs albuterol refill.  This has been sent, nothing further needed.

## 2014-10-15 DIAGNOSIS — M1712 Unilateral primary osteoarthritis, left knee: Secondary | ICD-10-CM | POA: Diagnosis not present

## 2014-10-15 DIAGNOSIS — Z471 Aftercare following joint replacement surgery: Secondary | ICD-10-CM | POA: Diagnosis not present

## 2014-10-15 DIAGNOSIS — Z96651 Presence of right artificial knee joint: Secondary | ICD-10-CM | POA: Diagnosis not present

## 2014-10-15 DIAGNOSIS — Z681 Body mass index (BMI) 19 or less, adult: Secondary | ICD-10-CM | POA: Diagnosis not present

## 2014-10-15 DIAGNOSIS — M25561 Pain in right knee: Secondary | ICD-10-CM | POA: Diagnosis not present

## 2014-10-15 DIAGNOSIS — R52 Pain, unspecified: Secondary | ICD-10-CM | POA: Diagnosis not present

## 2014-10-24 DIAGNOSIS — Z803 Family history of malignant neoplasm of breast: Secondary | ICD-10-CM | POA: Diagnosis not present

## 2014-10-24 DIAGNOSIS — N6011 Diffuse cystic mastopathy of right breast: Secondary | ICD-10-CM | POA: Diagnosis not present

## 2014-10-24 DIAGNOSIS — Z6836 Body mass index (BMI) 36.0-36.9, adult: Secondary | ICD-10-CM | POA: Diagnosis not present

## 2014-10-24 DIAGNOSIS — Z1231 Encounter for screening mammogram for malignant neoplasm of breast: Secondary | ICD-10-CM | POA: Diagnosis not present

## 2014-10-24 DIAGNOSIS — R928 Other abnormal and inconclusive findings on diagnostic imaging of breast: Secondary | ICD-10-CM | POA: Diagnosis not present

## 2014-10-24 DIAGNOSIS — N6012 Diffuse cystic mastopathy of left breast: Secondary | ICD-10-CM | POA: Diagnosis not present

## 2014-10-24 DIAGNOSIS — N6459 Other signs and symptoms in breast: Secondary | ICD-10-CM | POA: Diagnosis not present

## 2014-10-28 DIAGNOSIS — R110 Nausea: Secondary | ICD-10-CM | POA: Diagnosis not present

## 2014-10-30 ENCOUNTER — Other Ambulatory Visit: Payer: Self-pay

## 2014-10-30 DIAGNOSIS — R110 Nausea: Secondary | ICD-10-CM | POA: Diagnosis not present

## 2014-10-30 MED ORDER — ALBUTEROL SULFATE HFA 108 (90 BASE) MCG/ACT IN AERS: 2.0000 | INHALATION_SPRAY | Freq: Four times a day (QID) | RESPIRATORY_TRACT | Status: DC | PRN

## 2014-11-03 ENCOUNTER — Encounter: Payer: Self-pay | Admitting: Pulmonary Disease

## 2014-11-05 DIAGNOSIS — L930 Discoid lupus erythematosus: Secondary | ICD-10-CM | POA: Diagnosis not present

## 2014-11-05 DIAGNOSIS — M329 Systemic lupus erythematosus, unspecified: Secondary | ICD-10-CM | POA: Diagnosis not present

## 2014-11-05 DIAGNOSIS — R110 Nausea: Secondary | ICD-10-CM | POA: Diagnosis not present

## 2014-11-05 DIAGNOSIS — M797 Fibromyalgia: Secondary | ICD-10-CM | POA: Diagnosis not present

## 2014-11-07 DIAGNOSIS — K219 Gastro-esophageal reflux disease without esophagitis: Secondary | ICD-10-CM | POA: Diagnosis not present

## 2014-11-07 DIAGNOSIS — K9289 Other specified diseases of the digestive system: Secondary | ICD-10-CM | POA: Diagnosis not present

## 2014-11-07 DIAGNOSIS — M329 Systemic lupus erythematosus, unspecified: Secondary | ICD-10-CM | POA: Diagnosis not present

## 2014-11-07 DIAGNOSIS — M1380 Other specified arthritis, unspecified site: Secondary | ICD-10-CM | POA: Diagnosis not present

## 2014-11-07 DIAGNOSIS — R110 Nausea: Secondary | ICD-10-CM | POA: Diagnosis not present

## 2014-11-07 DIAGNOSIS — J45909 Unspecified asthma, uncomplicated: Secondary | ICD-10-CM | POA: Diagnosis not present

## 2014-11-07 DIAGNOSIS — E669 Obesity, unspecified: Secondary | ICD-10-CM | POA: Diagnosis not present

## 2014-11-07 DIAGNOSIS — M797 Fibromyalgia: Secondary | ICD-10-CM | POA: Diagnosis not present

## 2014-11-07 DIAGNOSIS — F329 Major depressive disorder, single episode, unspecified: Secondary | ICD-10-CM | POA: Diagnosis not present

## 2014-11-07 DIAGNOSIS — K30 Functional dyspepsia: Secondary | ICD-10-CM | POA: Diagnosis not present

## 2014-11-07 DIAGNOSIS — Z800 Family history of malignant neoplasm of digestive organs: Secondary | ICD-10-CM | POA: Diagnosis not present

## 2014-11-07 DIAGNOSIS — Z880 Allergy status to penicillin: Secondary | ICD-10-CM | POA: Diagnosis not present

## 2014-11-07 DIAGNOSIS — I129 Hypertensive chronic kidney disease with stage 1 through stage 4 chronic kidney disease, or unspecified chronic kidney disease: Secondary | ICD-10-CM | POA: Diagnosis not present

## 2014-11-07 DIAGNOSIS — N181 Chronic kidney disease, stage 1: Secondary | ICD-10-CM | POA: Diagnosis not present

## 2014-11-07 DIAGNOSIS — Z96659 Presence of unspecified artificial knee joint: Secondary | ICD-10-CM | POA: Diagnosis not present

## 2014-11-07 DIAGNOSIS — D352 Benign neoplasm of pituitary gland: Secondary | ICD-10-CM | POA: Diagnosis not present

## 2014-11-07 DIAGNOSIS — R51 Headache: Secondary | ICD-10-CM | POA: Diagnosis not present

## 2014-11-07 DIAGNOSIS — G894 Chronic pain syndrome: Secondary | ICD-10-CM | POA: Diagnosis not present

## 2014-11-07 DIAGNOSIS — K5909 Other constipation: Secondary | ICD-10-CM | POA: Diagnosis not present

## 2014-11-07 DIAGNOSIS — K319 Disease of stomach and duodenum, unspecified: Secondary | ICD-10-CM | POA: Diagnosis not present

## 2014-11-07 DIAGNOSIS — F410 Panic disorder [episodic paroxysmal anxiety] without agoraphobia: Secondary | ICD-10-CM | POA: Diagnosis not present

## 2014-11-07 DIAGNOSIS — H905 Unspecified sensorineural hearing loss: Secondary | ICD-10-CM | POA: Diagnosis not present

## 2014-11-20 DIAGNOSIS — R490 Dysphonia: Secondary | ICD-10-CM | POA: Diagnosis not present

## 2014-11-20 DIAGNOSIS — J385 Laryngeal spasm: Secondary | ICD-10-CM | POA: Diagnosis not present

## 2014-11-20 DIAGNOSIS — R05 Cough: Secondary | ICD-10-CM | POA: Diagnosis not present

## 2014-11-25 DIAGNOSIS — I10 Essential (primary) hypertension: Secondary | ICD-10-CM | POA: Diagnosis not present

## 2014-11-25 DIAGNOSIS — R110 Nausea: Secondary | ICD-10-CM | POA: Diagnosis not present

## 2014-11-25 DIAGNOSIS — F411 Generalized anxiety disorder: Secondary | ICD-10-CM | POA: Diagnosis not present

## 2014-11-25 DIAGNOSIS — K219 Gastro-esophageal reflux disease without esophagitis: Secondary | ICD-10-CM | POA: Diagnosis not present

## 2014-11-25 DIAGNOSIS — M797 Fibromyalgia: Secondary | ICD-10-CM | POA: Diagnosis not present

## 2014-11-28 DIAGNOSIS — Z96651 Presence of right artificial knee joint: Secondary | ICD-10-CM | POA: Diagnosis not present

## 2014-11-28 DIAGNOSIS — M25561 Pain in right knee: Secondary | ICD-10-CM | POA: Diagnosis not present

## 2014-11-28 DIAGNOSIS — G8929 Other chronic pain: Secondary | ICD-10-CM | POA: Diagnosis not present

## 2014-12-02 DIAGNOSIS — J385 Laryngeal spasm: Secondary | ICD-10-CM | POA: Diagnosis not present

## 2014-12-02 DIAGNOSIS — R05 Cough: Secondary | ICD-10-CM | POA: Diagnosis not present

## 2014-12-02 DIAGNOSIS — R490 Dysphonia: Secondary | ICD-10-CM | POA: Diagnosis not present

## 2014-12-17 DIAGNOSIS — J385 Laryngeal spasm: Secondary | ICD-10-CM | POA: Diagnosis not present

## 2014-12-17 DIAGNOSIS — R490 Dysphonia: Secondary | ICD-10-CM | POA: Diagnosis not present

## 2014-12-17 DIAGNOSIS — R05 Cough: Secondary | ICD-10-CM | POA: Diagnosis not present

## 2014-12-18 DIAGNOSIS — Z96651 Presence of right artificial knee joint: Secondary | ICD-10-CM | POA: Diagnosis not present

## 2014-12-18 DIAGNOSIS — G8929 Other chronic pain: Secondary | ICD-10-CM | POA: Diagnosis not present

## 2014-12-18 DIAGNOSIS — M25561 Pain in right knee: Secondary | ICD-10-CM | POA: Diagnosis not present

## 2014-12-19 ENCOUNTER — Encounter: Payer: Self-pay | Admitting: Pulmonary Disease

## 2014-12-23 DIAGNOSIS — Z96651 Presence of right artificial knee joint: Secondary | ICD-10-CM | POA: Diagnosis not present

## 2014-12-23 DIAGNOSIS — K5900 Constipation, unspecified: Secondary | ICD-10-CM | POA: Diagnosis not present

## 2014-12-23 DIAGNOSIS — M25561 Pain in right knee: Secondary | ICD-10-CM | POA: Diagnosis not present

## 2014-12-23 DIAGNOSIS — G8929 Other chronic pain: Secondary | ICD-10-CM | POA: Diagnosis not present

## 2014-12-25 DIAGNOSIS — D497 Neoplasm of unspecified behavior of endocrine glands and other parts of nervous system: Secondary | ICD-10-CM | POA: Diagnosis not present

## 2014-12-26 NOTE — Discharge Summary (Signed)
PATIENT NAME:  Nicole Ballard, Nicole Ballard MR#:  [id number] DATE OF BIRTH:  15-Jun-1956  DATE OF ADMISSION:  09/09/2012 DATE OF DISCHARGE:  09/10/2012  For a detailed note, please take a look at the history and physical done on admission.   DIAGNOSES AT DISCHARGE: 1.  Chest pain, likely related to the patient's systemic lupus or musculoskeletal in nature.  2.  History of systemic lupus.  3.  Depression/anxiety.  4.  Neuropathy.  5.  Hypertension. 6.  Gastroesophageal reflux disease.   DIET:  The patient was discharged on a low sodium, low fat diet.   ACTIVITY: As tolerated.   FOLLOWUP: Follow up with Dr. Etter Sjogren next 1 to 2 weeks.   DISCHARGE MEDICATIONS:  Xanax 0.5 mg 2 tabs t.i.d.  Trazodone 150 mg at bedtime. Plaquenil 100 mg b.i.d.  Hydrochlorothiazide 25 mg daily.  Vitamin D3 2000 international units t.i.d.  Loratadine 10 mg daily. Hydroxyzine 25 mg at bedtime. Flexeril 10 mg t.i.d. as needed. Clobetasol topical ointment to be applied daily as needed.  Levaquin 750 mg daily.  Tussionex 5 mL b.i.d. x 7 days. Gabapentin 300 mg two caps q.i.d.  Lamictal 100 mg 1 tab in the morning and 1 tab in the evening and 1-1/2 tabs at bedtime. Omeprazole 20 mg daily. Losartan 25 mg daily.  Nystatin to be applied 2 to 3 times daily.  That is a topical cream.  Cymbalta 60 mg b.i.d. Prednisone taper starting at 50 mg, down to 10 mg over the next 5 days.   PERTINENT STUDIES DONE DURING THE HOSPITAL COURSE:  Chest x-ray done on January 4th showed markedly shallow inspiration.  An ultrasound of the lower extremities showed no sonographic evidence of DVT in right or left lower extremity. Nuclear medicine lung V-Q scan which was low probability for acute pulmonary embolism.   BRIEF HOSPITAL COURSE:  This is a 59 year old female who presented to the hospital with chest pain/shortness of breath.    Problem 1.  Chest pains/shortness of breath. The patient was observed overnight in the hospital on telemetry, had no  evidence of any acute cardiac arrhythmias, had cardiac markers x 3 checked which were negative. She also was noted to have a slightly elevated D-dimer, therefore there was some concern for pulmonary embolism. I did Doppler of her lower extremities which was negative for DVT. She could not she cannot tolerate a CT scan of her chest with contrast as she has some lupus-related renal disease, therefore, high risk for contrast-induced nephropathy.  Therefore, I got a V-Q scan which was low probability for PE. The patient was still complaining of some palpitations and some mild shortness of breath which is probably anxiety provoked and also probably related to a lupus related lung disease.  Therefore I did discharge her home on a prednisone taper as stated.   Problem 2.  Systemic lupus. As mentioned, her chest pain could possibly come lupus related lung disease, therefore I discharged home on a prednisone taper. She will continue her Plaquenil and follow-up with [facility] rheumatology.   Problem 3.  Anxiety: The patient will continue her Xanax as stated.   Problem 4. Hypertension. The patient remained hemodynamically stable on her hydrochlorothiazide and losartan. She will continue that.   Problem 5.  History of neuropathy. The patient was maintained on her gabapentin.  She will also resume that upon discharge.   Problem 6.  Depression. The patient was maintained on Cymbalta and she will resume that upon discharge too.   CODE  STATUS: The patient is a full code.   Time spent on discharge: 40 minutes.    ____________________________ Belia Heman. Verdell Carmine, MD vjs:ct D: 09/10/2012 15:19:12 ET T: 09/11/2012 07:51:57 ET JOB#: [id number]  cc: Belia Heman. Verdell Carmine, MD, <Dictator> Bethena Roys. Ancil Boozer, MD Henreitta Leber MD ELECTRONICALLY SIGNED 09/12/2012 14:06

## 2014-12-26 NOTE — H&P (Signed)
PATIENT NAME:  Nicole Ballard, Nicole Ballard MR#:  [id number] DATE OF BIRTH:  04/03/1956  DATE OF ADMISSION:  09/09/2012  PRIMARY CARE PHYSICIAN:  Steele Sizer, MD  RHEUMATOLOGIST:  Located at [facility].   CHIEF COMPLAINT: Chest pain and shortness of breath.   HISTORY OF PRESENT ILLNESS: This is a 59 year old female who presents to the Emergency Room with chest pain and shortness of breath ongoing now for the past 2 days or so. The patient says that she developed some shortness of breath progressive over the past 2 weeks, was admitted to the hospital at [facility] and discharged on some antibiotics for pneumonia. She was also scheduled to get outpatient stress test coming up in the next 1 to 2 days. She came back to the ER as her clinical symptoms were not getting any better, and she developed worsening shortness of breath and chest pain. The patient in the ER here was treated with some p.o. steroids and discharged home.  On the way to the car, she got more short of breath and then to start developing worsening chest pain and therefore, was brought back to the Emergency Room. The patient presently says that her chest pain and shortness of breath has improved. Hospitalist services were contacted for further treatment and evaluation.   REVIEW OF SYSTEMS:  CONSTITUTIONAL: No documented fever. No weight gain or weight loss.  EYES: No blurred or double vision.  ENT: No tinnitus. No postnasal drip. No redness of the oropharynx.  RESPIRATORY: Positive cough. Positive wheeze. No hemoptysis. Positive dyspnea.  CARDIOVASCULAR: Positive chest pain. No orthopnea, no palpitations or syncope.  GASTROINTESTINAL: Positive nausea. No vomiting, no diarrhea, no abdominal pain, no melena or hematochezia.  GENITOURINARY: No dysuria, no hematuria.  ENDOCRINE: No polyuria or nocturia. No heat or cold intolerance.  HEMATOLOGY:  No anemia, no bruising, no bleeding.  INTEGUMENTARY: No rashes. No lesions.  MUSCULOSKELETAL: No  arthritis, no swelling and no gout.  NEUROLOGIC: No numbness, no tingling, no ataxia, no seizure-type activity. PSYCHIATRIC:  Positive anxiety. Positive depression. No ADD.   PAST MEDICAL HISTORY: Consistent with lupus, history of pituitary adenoma anxiety/depression, GERD, hypertension neuropathy.  ALLERGIES:  PENICILLIN CAUSES HIVES.  SOCIAL HISTORY: No smoking. No alcohol abuse. No illicit drug abuse. Lives by herself.   FAMILY HISTORY: Mother died from complications of breast cancer, also has diabetes.   CURRENT MEDICATIONS: 1.  Lamictal 100 mg b.i.d. and 150 mg at bedtime.  2.  Xanax 0.5 mg, 2 in the morning, 2 in the evening and 2 at bedtime.  3.  Cymbalta 120 mg at bedtime.  4.  Trazodone 150 mg at bedtime.  5.  Gabapentin 2400 mg at bedtime.  6.  Plaquenil 200 mg b.i.d. 7.  hydrochlorothiazide 25 mg daily.  8.  Omeprazole 40 mg daily.  9.  Vitamin D3, 2000 international units daily.  10.  Loratadine 10 mg daily.  11.  Hydroxyzine 25 mg at bedtime as needed.  12.  Flexeril 10 mg t.i.d. as needed. 13.  Clobetasol ointment to be applied nightly as needed.   PHYSICAL EXAMINATION:  VITAL SIGNS: Temperature 98.2, pulse 85, respirations 22, blood pressure 147/82, sats 94% on room air.  GENERAL: She is a pleasant appearing female in no apparent distress.  HEENT: Atraumatic, normocephalic. Extraocular muscles are intact. Pupils equal, reactive to light. Sclerae anicteric. No conjunctival injection. No pharyngeal erythema.  NECK: Supple. No jugular venous distention, no bruits, no lymphadenopathy or thyromegaly.  HEART: Regular rate and rhythm. No murmurs,  rubs or clicks.  LUNGS: Clear to auscultation bilaterally. No rales, no rhonchi, no wheezes.  ABDOMEN: Soft, flat, nontender, nondistended. Has good bowel sounds. No hepatosplenomegaly appreciated.  EXTREMITIES: No evidence of any cyanosis, clubbing or peripheral edema. Has +2 pedal and radial pulses bilaterally.  NEUROLOGIC: The  patient is alert, awake and oriented x 3 with no focal motor or sensory deficits appreciable.  SKIN: Moist and warm with no rash appreciated.  BACK:  There is no cervical or axillary lymphadenopathy.  LABORATORY AND DIAGNOSTIC DATA:  Glucose 102, BUN 10, creatinine 0.9, sodium 141, potassium 4.1, chloride 107, bicarbonate 30. LFTs are within normal limits. Troponin less than 0.02. White cell count 5.9, hemoglobin 13.4, hematocrit 39.4, platelet count 325, INR 1.0. D-dimer 1.28.   The patient did have a chest x-ray done which showed no evidence of any acute cardiopulmonary disease.   ASSESSMENT AND PLAN: This is a 59 year old female with a history of hypertension, gastroesophageal reflux disease, depression/anxiety, neuropathy, history of lupus, pituitary adenoma who presents to the hospital with chest pain and shortness of breath.   1.  Chest pain/shortness of breath. The exact etiology of this is unclear. Questionable if this is an anginal equivalent versus possible pulmonary embolus versus atypical pneumonia or lupus related lung disease. Her cardiac markers x 2 have been negative. I will go ahead and get a third set.  The likelihood of this being cardiac is low. She also has a slightly elevated D-dimer which can be nonspecifically elevated in patients with lupus.  Therefore not sure if this is truly a deep vein thrombosis or pulmonary embolism. I will go ahead and get Dopplers of her lower extremities to rule out a deep vein thrombosis. I cannot get a CT of her chest. She has some history of lupus related renal disease and therefore high risk for getting contrast-induced nephropathy. I will try to get a V-Q scan in the morning. Hold off on anticoagulation at this point.   We will also go ahead and treat for atypical pneumonia with ceftriaxone and Zithromax and follow sputum cultures and follow clinically. The patient also may possibly have lupus related lung disease therefore, I will start to treat  her with some low-dose intravenous steroids and follow her clinically.  2.  Depression/anxiety: Continue Cymbalta, Xanax and Lamictal. 3.  Gastroesophageal reflux disease. Continue Prilosec.  4.  Neuropathy. Continue Neurontin.  5.  Hypertension, presently hemodynamically stable. Continue hydrochlorothiazide.  6.  History of lupus:  Continue Plaquenil and intravenous steroids, as mentioned.   CODE STATUS: The patient is a full code.   Time spent with admission: 50 minutes    ____________________________ Belia Heman. Verdell Carmine, MD vjs:ct D: 09/09/2012 10:33:06 ET T: 09/09/2012 11:22:58 ET JOB#: [id number]  cc: Belia Heman. Verdell Carmine, MD, <Dictator> Henreitta Leber MD ELECTRONICALLY SIGNED 09/10/2012 8:17

## 2014-12-29 DIAGNOSIS — Z7409 Other reduced mobility: Secondary | ICD-10-CM | POA: Diagnosis not present

## 2014-12-29 DIAGNOSIS — M25561 Pain in right knee: Secondary | ICD-10-CM | POA: Diagnosis not present

## 2014-12-29 DIAGNOSIS — G8929 Other chronic pain: Secondary | ICD-10-CM | POA: Diagnosis not present

## 2014-12-29 DIAGNOSIS — Z96651 Presence of right artificial knee joint: Secondary | ICD-10-CM | POA: Diagnosis not present

## 2015-01-05 DIAGNOSIS — Z9889 Other specified postprocedural states: Secondary | ICD-10-CM | POA: Diagnosis not present

## 2015-01-05 DIAGNOSIS — Z471 Aftercare following joint replacement surgery: Secondary | ICD-10-CM | POA: Diagnosis not present

## 2015-01-05 DIAGNOSIS — M1712 Unilateral primary osteoarthritis, left knee: Secondary | ICD-10-CM | POA: Diagnosis not present

## 2015-01-05 DIAGNOSIS — Z96651 Presence of right artificial knee joint: Secondary | ICD-10-CM | POA: Diagnosis not present

## 2015-01-05 DIAGNOSIS — M25561 Pain in right knee: Secondary | ICD-10-CM | POA: Diagnosis not present

## 2015-01-09 DIAGNOSIS — J383 Other diseases of vocal cords: Secondary | ICD-10-CM | POA: Diagnosis not present

## 2015-01-09 DIAGNOSIS — J029 Acute pharyngitis, unspecified: Secondary | ICD-10-CM | POA: Diagnosis not present

## 2015-01-09 DIAGNOSIS — J385 Laryngeal spasm: Secondary | ICD-10-CM | POA: Diagnosis not present

## 2015-01-09 DIAGNOSIS — R05 Cough: Secondary | ICD-10-CM | POA: Diagnosis not present

## 2015-01-09 DIAGNOSIS — R490 Dysphonia: Secondary | ICD-10-CM | POA: Diagnosis not present

## 2015-01-12 DIAGNOSIS — G8929 Other chronic pain: Secondary | ICD-10-CM | POA: Diagnosis not present

## 2015-01-12 DIAGNOSIS — Z96651 Presence of right artificial knee joint: Secondary | ICD-10-CM | POA: Diagnosis not present

## 2015-01-12 DIAGNOSIS — M25561 Pain in right knee: Secondary | ICD-10-CM | POA: Diagnosis not present

## 2015-01-21 DIAGNOSIS — Z96651 Presence of right artificial knee joint: Secondary | ICD-10-CM | POA: Diagnosis not present

## 2015-01-21 DIAGNOSIS — M25561 Pain in right knee: Secondary | ICD-10-CM | POA: Diagnosis not present

## 2015-01-21 DIAGNOSIS — G8929 Other chronic pain: Secondary | ICD-10-CM | POA: Diagnosis not present

## 2015-01-22 DIAGNOSIS — R05 Cough: Secondary | ICD-10-CM | POA: Diagnosis not present

## 2015-01-22 DIAGNOSIS — J385 Laryngeal spasm: Secondary | ICD-10-CM | POA: Diagnosis not present

## 2015-01-22 DIAGNOSIS — J029 Acute pharyngitis, unspecified: Secondary | ICD-10-CM | POA: Diagnosis not present

## 2015-01-22 DIAGNOSIS — Z79899 Other long term (current) drug therapy: Secondary | ICD-10-CM | POA: Diagnosis not present

## 2015-01-22 DIAGNOSIS — K219 Gastro-esophageal reflux disease without esophagitis: Secondary | ICD-10-CM | POA: Diagnosis not present

## 2015-01-22 DIAGNOSIS — R490 Dysphonia: Secondary | ICD-10-CM | POA: Diagnosis not present

## 2015-01-27 ENCOUNTER — Emergency Department
Admission: EM | Admit: 2015-01-27 | Discharge: 2015-01-27 | Disposition: A | Discharge status: Home / Self Care | Payer: Medicare Other | Attending: Emergency Medicine | Admitting: Emergency Medicine

## 2015-01-27 ENCOUNTER — Encounter: Payer: Self-pay | Admitting: Emergency Medicine

## 2015-01-27 ENCOUNTER — Emergency Department: Payer: Medicare Other

## 2015-01-27 DIAGNOSIS — Z880 Allergy status to penicillin: Secondary | ICD-10-CM | POA: Diagnosis not present

## 2015-01-27 DIAGNOSIS — R140 Abdominal distension (gaseous): Secondary | ICD-10-CM | POA: Diagnosis not present

## 2015-01-27 DIAGNOSIS — H9191 Unspecified hearing loss, right ear: Secondary | ICD-10-CM | POA: Diagnosis not present

## 2015-01-27 DIAGNOSIS — N181 Chronic kidney disease, stage 1: Secondary | ICD-10-CM | POA: Diagnosis not present

## 2015-01-27 DIAGNOSIS — I129 Hypertensive chronic kidney disease with stage 1 through stage 4 chronic kidney disease, or unspecified chronic kidney disease: Secondary | ICD-10-CM | POA: Diagnosis not present

## 2015-01-27 DIAGNOSIS — Z79899 Other long term (current) drug therapy: Secondary | ICD-10-CM | POA: Diagnosis not present

## 2015-01-27 DIAGNOSIS — N189 Chronic kidney disease, unspecified: Secondary | ICD-10-CM | POA: Insufficient documentation

## 2015-01-27 DIAGNOSIS — E8881 Metabolic syndrome: Secondary | ICD-10-CM | POA: Diagnosis not present

## 2015-01-27 DIAGNOSIS — K5900 Constipation, unspecified: Secondary | ICD-10-CM

## 2015-01-27 DIAGNOSIS — R110 Nausea: Secondary | ICD-10-CM | POA: Diagnosis not present

## 2015-01-27 DIAGNOSIS — H81399 Other peripheral vertigo, unspecified ear: Secondary | ICD-10-CM | POA: Diagnosis not present

## 2015-01-27 LAB — CBC WITH DIFFERENTIAL/PLATELET
Basophils Absolute: 0.1 10*3/uL (ref 0–0.1)
Basophils Relative: 1 %
EOS ABS: 0.2 10*3/uL (ref 0–0.7)
Eosinophils Relative: 4 %
HEMATOCRIT: 39.1 % (ref 35.0–47.0)
HEMOGLOBIN: 13 g/dL (ref 12.0–16.0)
Lymphocytes Relative: 37 %
Lymphs Abs: 2.1 10*3/uL (ref 1.0–3.6)
MCH: 27.9 pg (ref 26.0–34.0)
MCHC: 33.4 g/dL (ref 32.0–36.0)
MCV: 83.7 fL (ref 80.0–100.0)
MONOS PCT: 11 %
Monocytes Absolute: 0.6 10*3/uL (ref 0.2–0.9)
NEUTROS ABS: 2.7 10*3/uL (ref 1.4–6.5)
Neutrophils Relative %: 47 %
PLATELETS: 284 10*3/uL (ref 150–440)
RBC: 4.67 MIL/uL (ref 3.80–5.20)
RDW: 14 % (ref 11.5–14.5)
WBC: 5.7 10*3/uL (ref 3.6–11.0)

## 2015-01-27 LAB — BASIC METABOLIC PANEL
ANION GAP: 5 (ref 5–15)
BUN: 15 mg/dL (ref 6–20)
CO2: 30 mmol/L (ref 22–32)
Calcium: 8.6 mg/dL — ABNORMAL LOW (ref 8.9–10.3)
Chloride: 103 mmol/L (ref 101–111)
Creatinine, Ser: 0.89 mg/dL (ref 0.44–1.00)
GLUCOSE: 108 mg/dL — AB (ref 65–99)
Potassium: 3.9 mmol/L (ref 3.5–5.1)
Sodium: 138 mmol/L (ref 135–145)

## 2015-01-27 LAB — HEPATIC FUNCTION PANEL
ALBUMIN: 3.4 g/dL — AB (ref 3.5–5.0)
ALT: 16 U/L (ref 14–54)
AST: 26 U/L (ref 15–41)
Alkaline Phosphatase: 76 U/L (ref 38–126)
TOTAL PROTEIN: 8 g/dL (ref 6.5–8.1)
Total Bilirubin: 0.3 mg/dL (ref 0.3–1.2)

## 2015-01-27 LAB — LIPASE, BLOOD: Lipase: 33 U/L (ref 22–51)

## 2015-01-27 LAB — MAGNESIUM: MAGNESIUM: 1.9 mg/dL (ref 1.7–2.4)

## 2015-01-27 MED ORDER — MAGNESIUM CITRATE PO SOLN
ORAL | Status: AC
  Filled 2015-01-27: qty 296

## 2015-01-27 MED ORDER — SENNA 8.6 MG PO TABS: 2.0000 | ORAL_TABLET | Freq: Two times a day (BID) | ORAL | Status: DC

## 2015-01-27 MED ORDER — METOCLOPRAMIDE HCL 10 MG PO TABS: 10.0000 mg | ORAL_TABLET | Freq: Three times a day (TID) | ORAL | Status: DC

## 2015-01-27 MED ORDER — MAGNESIUM CITRATE PO SOLN
ORAL | Status: AC
  Administered 2015-01-27: 1 via ORAL
  Filled 2015-01-27: qty 296

## 2015-01-27 MED ORDER — MAGNESIUM CITRATE PO SOLN
1.0000 | Freq: Once | ORAL | Status: AC
  Administered 2015-01-27: 1 via ORAL

## 2015-01-27 MED ORDER — DOCUSATE SODIUM 100 MG PO CAPS: 200.0000 mg | ORAL_CAPSULE | Freq: Two times a day (BID) | ORAL | Status: DC

## 2015-01-27 MED ORDER — POLYETHYLENE GLYCOL 3350 17 GM/SCOOP PO POWD: ORAL | Status: DC

## 2015-01-27 NOTE — Discharge Instructions (Signed)

## 2015-01-27 NOTE — ED Notes (Signed)
States she has had only 3 BM's over the past month  Has taken otc meds w/o any results  Pos nausea

## 2015-01-27 NOTE — ED Notes (Signed)
Pt c/o constipation going on for two weeks now. Has tried everything otc and nothing has helped. Also states has some nausea but denies any v/d. No acute distress noted.

## 2015-01-27 NOTE — ED Notes (Signed)
Patient with no complaints at this time. Respirations even and unlabored. Skin warm/dry. Discharge instructions reviewed with patient at this time. Patient given opportunity to voice concerns/ask questions. IV removed per policy and band-aid applied to site. Patient discharged at this time and left Emergency Department with steady gait.  

## 2015-01-27 NOTE — ED Provider Notes (Signed)
[facility] Emergency Department Provider Note  ____________________________________________  Time seen: 6:15 PM  I have reviewed the triage vital signs and the nursing notes.   HISTORY  Chief Complaint Constipation    HPI Nicole Ballard is a 59 y.o. female who reports some problems with constipation in the past, and now complaining of constipation for the past 2-4 weeks. She is trying stool softeners, MiraLAX, and Gas-X without relief. Denies any opioid use. No vomiting, eating normally and ate a full lunch today. No fever chills chest pain shortness of breath headache dizziness lightheadedness or syncope. Normal urination.     Past Medical History  Diagnosis Date  . Fibromyalgia   . Paresthesia   . GERD (gastroesophageal reflux disease)   . Rhinitis   . Systemic lupus erythematosus   . Hemifacial spasm   . Chronic kidney disease   . Chronic pain   . Anxiety   . Vitamin D deficiency   . Bipolar 1 disorder   . Hypertension   . Osteoarthritis   . Lumbago   . Insomnia   . Hyperlipidemia   . Hearing loss of right ear     Patient Active Problem List   Diagnosis Date Noted  . Shortness of breath 05/20/2014  . Cough 05/20/2014  . Vocal cord dysfunction 05/28/2013  . Back pain 12/18/2012  . Pulmonary nodules 11/26/2012  . Aspiration pneumonitis 10/22/2012    Past Surgical History  Procedure Laterality Date  . Cholecystectomy    . Vaginal hysterectomy    . Brain surgery      Current Outpatient Rx  Name  Route  Sig  Dispense  Refill  . albuterol (PROAIR HFA) 108 (90 BASE) MCG/ACT inhaler   Inhalation   Inhale 2 puffs into the lungs every 6 (six) hours as needed for wheezing. DX code R06.02   3 Inhaler   3   . baclofen (LIORESAL) 10 MG tablet   Oral   Take 10 mg by mouth 2 (two) times daily.         . chlorpheniramine-HYDROcodone (TUSSIONEX PENNKINETIC ER) 10-8 MG/5ML LQCR   Oral   Take 5 mLs by mouth at bedtime as needed for  cough.   115 mL   0   . Cholecalciferol (VITAMIN D) 2000 UNITS CAPS   Oral   Take 1 capsule by mouth daily.         . clobetasol cream (TEMOVATE) 0.05 %   Topical   Apply 1 application topically 2 (two) times daily.         . clonazePAM (KLONOPIN) 1 MG tablet   Oral   Take 1 mg by mouth. 1 tab qam, 2 tab qhs         . cyclobenzaprine (FLEXERIL) 10 MG tablet   Oral   Take 10 mg by mouth 3 (three) times daily as needed for muscle spasms.         Marland Kitchen docusate sodium (COLACE) 100 MG capsule   Oral   Take 2 capsules (200 mg total) by mouth 2 (two) times daily.   120 capsule   0   . DULoxetine (CYMBALTA) 60 MG capsule   Oral   Take 120 mg by mouth at bedtime.          . fluticasone (FLONASE) 50 MCG/ACT nasal spray   Nasal   Place 2 sprays into the nose daily as needed.          . hydroxychloroquine (PLAQUENIL) 200 MG tablet  Oral   Take 200 mg by mouth 2 (two) times daily.         Marland Kitchen losartan-hydrochlorothiazide (HYZAAR) 100-25 MG per tablet   Oral   Take 1 tablet by mouth daily.         . metoCLOPramide (REGLAN) 10 MG tablet   Oral   Take 1 tablet (10 mg total) by mouth 4 (four) times daily -  before meals and at bedtime.   60 tablet   0   . Multiple Vitamin (MULTIVITAMIN) tablet   Oral   Take 1 tablet by mouth daily.         Marland Kitchen omeprazole (PRILOSEC) 40 MG capsule   Oral   Take 40 mg by mouth daily.         . polyethylene glycol powder (GLYCOLAX/MIRALAX) powder      2 cap fulls in a full glass of water, three times a day, for 5 days.   255 g   0   . polyethylene glycol-electrolytes (NULYTELY/GOLYTELY) 420 G solution   Oral   Take 4,000 mLs by mouth 3 (three) times daily.          Marland Kitchen senna (SENOKOT) 8.6 MG TABS tablet   Oral   Take 2 tablets (17.2 mg total) by mouth 2 (two) times daily.   120 each   0   . traZODone (DESYREL) 150 MG tablet   Oral   Take 150 mg by mouth at bedtime.           Allergies Ace inhibitors and  Penicillins  Family History  Problem Relation Age of Onset  . Breast cancer Mother   . Cancer Father   . Breast cancer Maternal Aunt     Social History History  Substance Use Topics  . Smoking status: Never Smoker   . Smokeless tobacco: Never Used  . Alcohol Use: No    Review of Systems  Constitutional: No fever or chills. No weight changes Eyes:No blurry vision or double vision.  ENT: No sore throat. Cardiovascular: No chest pain. Respiratory: No dyspnea or cough. Gastrointestinal: Negative for abdominal pain, vomiting and diarrhea.  No BRBPR or melena. Constipation Genitourinary: Negative for dysuria, urinary retention, bloody urine, or difficulty urinating. Musculoskeletal: Negative for back pain. No joint swelling or pain. Skin: Negative for rash. Neurological: Negative for headaches, focal weakness or numbness. Psychiatric:No anxiety or depression.   Endocrine:No hot/cold intolerance, changes in energy, or sleep difficulty.  10-point ROS otherwise negative.  ____________________________________________   PHYSICAL EXAM:  VITAL SIGNS: ED Triage Vitals  Enc Vitals Group     BP 01/27/15 1757 120/68 mmHg     Pulse Rate 01/27/15 1756 83     Resp 01/27/15 1757 20     Temp 01/27/15 1756 98.1 F (36.7 C)     Temp Source 01/27/15 1756 Oral     SpO2 01/27/15 1757 98 %     Weight 01/27/15 1757 236 lb (107.049 kg)     Height 01/27/15 1757 5\' 7"  (1.702 m)     Head Cir --      Peak Flow --      Pain Score 01/27/15 1757 5     Pain Loc --      Pain Edu? --      Excl. in [place]? --      Constitutional: Alert and oriented. Well appearing and in no distress. Eyes: No scleral icterus. No conjunctival pallor. PERRL. EOMI ENT   Head: Normocephalic and atraumatic.   Nose:  No congestion/rhinnorhea. No septal hematoma   Mouth/Throat: MMM, no pharyngeal erythema. No peritonsillar mass. No uvula shift.   Neck: No stridor. No SubQ emphysema. No  meningismus. Hematological/Lymphatic/Immunilogical: No cervical lymphadenopathy. Cardiovascular: RRR. Normal and symmetric distal pulses are present in all extremities. No murmurs, rubs, or gallops. Respiratory: Normal respiratory effort without tachypnea nor retractions. Breath sounds are clear and equal bilaterally. No wheezes/rales/rhonchi. Gastrointestinal: Soft, mildly distended, epigastric tenderness.  There is no CVA tenderness.  No rebound, rigidity, or guarding. Genitourinary: deferred Musculoskeletal: Nontender with normal range of motion in all extremities. No joint effusions.  No lower extremity tenderness.  No edema. Neurologic:   Normal speech and language.  CN 2-10 normal. Motor grossly intact. No pronator drift.  Normal gait. No gross focal neurologic deficits are appreciated.  Skin:  Skin is warm, dry and intact. No rash noted.  No petechiae, purpura, or bullae. Psychiatric: Mood and affect are normal. Speech and behavior are normal. Patient exhibits appropriate insight and judgment.  ____________________________________________    LABS (pertinent positives/negatives) (all labs ordered are listed, but only abnormal results are displayed) Labs Reviewed  CBC WITH DIFFERENTIAL/PLATELET  BASIC METABOLIC PANEL  HEPATIC FUNCTION PANEL  LIPASE, BLOOD  MAGNESIUM   ____________________________________________   EKG    ____________________________________________    RADIOLOGY  Abdominal x-ray reveals large stool burden in the large intestine without air-fluid levels or evidence of obstruction.  ____________________________________________   PROCEDURES  ____________________________________________   INITIAL IMPRESSION / ASSESSMENT AND PLAN / ED COURSE  Pertinent labs & imaging results that were available during my care of the patient were reviewed by me and considered in my medical decision making (see chart for details).  Patient presents with evaluation  consistent with constipation. Unknown cause. Low suspicion of obstruction or ileus though we will check her lactulose due to her underlying lupus and possibility for I disturbance causing such as syndrome.  ----------------------------------------- 9:25 PM on 01/27/2015 -----------------------------------------  No response to magnesium citrate. We'll attempt disimpaction manually. Followed up with the lab at 845 regarding the prolonged wait for laboratory results. They noted there was a machine error and have reprocessed the sample ----------------------------------------- 9:33 PM on 01/27/2015 -----------------------------------------  Rectal exam unremarkable. No stool involved we'll proceed with enema while awaiting lab results  ----------------------------------------- 9:44 PM on 01/27/2015 ----------------------------------------- Still waiting for lab results. Care of the patient signed out to Dr. Loura Pardon to follow-up in the labs. After the enema and laboratory  results returned, we will discharge her home to have her follow-up with her primary care doctor.  ____________________________________________   FINAL CLINICAL IMPRESSION(S) / ED DIAGNOSES  Final diagnoses:  Constipation, unspecified constipation type      Carrie Mew, MD 01/27/15 2145

## 2015-01-27 NOTE — ED Provider Notes (Signed)
-----------------------------------------   11:20 PM on 01/27/2015 -----------------------------------------  Care was assumed from Dr. Joni Fears at proximally 10 PM pending labs. Labs unremarkable and patient feels much improved after a large bowel movement. Discharge home with PCP follow-up.  Joanne Gavel, MD 01/27/15 [phone]

## 2015-02-05 DIAGNOSIS — J385 Laryngeal spasm: Secondary | ICD-10-CM | POA: Diagnosis not present

## 2015-02-05 DIAGNOSIS — R05 Cough: Secondary | ICD-10-CM | POA: Diagnosis not present

## 2015-02-05 DIAGNOSIS — R490 Dysphonia: Secondary | ICD-10-CM | POA: Diagnosis not present

## 2015-02-05 DIAGNOSIS — J383 Other diseases of vocal cords: Secondary | ICD-10-CM | POA: Diagnosis not present

## 2015-02-09 DIAGNOSIS — E669 Obesity, unspecified: Secondary | ICD-10-CM | POA: Diagnosis not present

## 2015-02-09 DIAGNOSIS — I129 Hypertensive chronic kidney disease with stage 1 through stage 4 chronic kidney disease, or unspecified chronic kidney disease: Secondary | ICD-10-CM | POA: Diagnosis not present

## 2015-02-09 DIAGNOSIS — N181 Chronic kidney disease, stage 1: Secondary | ICD-10-CM | POA: Diagnosis not present

## 2015-02-09 DIAGNOSIS — R809 Proteinuria, unspecified: Secondary | ICD-10-CM | POA: Diagnosis not present

## 2015-02-24 ENCOUNTER — Telehealth: Payer: Self-pay | Admitting: Pulmonary Disease

## 2015-02-24 ENCOUNTER — Other Ambulatory Visit: Payer: Self-pay | Admitting: Family Medicine

## 2015-02-24 MED ORDER — ALBUTEROL SULFATE HFA 108 (90 BASE) MCG/ACT IN AERS: 2.0000 | INHALATION_SPRAY | Freq: Four times a day (QID) | RESPIRATORY_TRACT | Status: DC | PRN

## 2015-02-24 NOTE — Telephone Encounter (Signed)
Spoke with pt. She needs refill on albuterol inhaler. RX sent in. Nothing further needed

## 2015-03-04 DIAGNOSIS — N181 Chronic kidney disease, stage 1: Secondary | ICD-10-CM | POA: Diagnosis not present

## 2015-03-04 DIAGNOSIS — F410 Panic disorder [episodic paroxysmal anxiety] without agoraphobia: Secondary | ICD-10-CM | POA: Diagnosis not present

## 2015-03-04 DIAGNOSIS — M329 Systemic lupus erythematosus, unspecified: Secondary | ICD-10-CM | POA: Diagnosis not present

## 2015-03-04 DIAGNOSIS — M549 Dorsalgia, unspecified: Secondary | ICD-10-CM | POA: Diagnosis not present

## 2015-03-04 DIAGNOSIS — R0789 Other chest pain: Secondary | ICD-10-CM | POA: Diagnosis not present

## 2015-03-04 DIAGNOSIS — Z803 Family history of malignant neoplasm of breast: Secondary | ICD-10-CM | POA: Diagnosis not present

## 2015-03-04 DIAGNOSIS — I129 Hypertensive chronic kidney disease with stage 1 through stage 4 chronic kidney disease, or unspecified chronic kidney disease: Secondary | ICD-10-CM | POA: Diagnosis not present

## 2015-03-04 DIAGNOSIS — R10816 Epigastric abdominal tenderness: Secondary | ICD-10-CM | POA: Diagnosis not present

## 2015-03-04 DIAGNOSIS — R918 Other nonspecific abnormal finding of lung field: Secondary | ICD-10-CM | POA: Diagnosis not present

## 2015-03-04 DIAGNOSIS — J929 Pleural plaque without asbestos: Secondary | ICD-10-CM | POA: Diagnosis not present

## 2015-03-04 DIAGNOSIS — R51 Headache: Secondary | ICD-10-CM | POA: Diagnosis not present

## 2015-03-04 DIAGNOSIS — Z9889 Other specified postprocedural states: Secondary | ICD-10-CM | POA: Diagnosis not present

## 2015-03-04 DIAGNOSIS — D352 Benign neoplasm of pituitary gland: Secondary | ICD-10-CM | POA: Diagnosis not present

## 2015-03-04 DIAGNOSIS — M5134 Other intervertebral disc degeneration, thoracic region: Secondary | ICD-10-CM | POA: Diagnosis not present

## 2015-03-04 DIAGNOSIS — R1013 Epigastric pain: Secondary | ICD-10-CM | POA: Diagnosis not present

## 2015-03-04 DIAGNOSIS — J383 Other diseases of vocal cords: Secondary | ICD-10-CM | POA: Diagnosis not present

## 2015-03-04 DIAGNOSIS — J45909 Unspecified asthma, uncomplicated: Secondary | ICD-10-CM | POA: Diagnosis not present

## 2015-03-04 DIAGNOSIS — R072 Precordial pain: Secondary | ICD-10-CM | POA: Diagnosis not present

## 2015-03-04 DIAGNOSIS — R0989 Other specified symptoms and signs involving the circulatory and respiratory systems: Secondary | ICD-10-CM | POA: Diagnosis not present

## 2015-03-04 DIAGNOSIS — R0602 Shortness of breath: Secondary | ICD-10-CM | POA: Diagnosis not present

## 2015-03-04 DIAGNOSIS — G5600 Carpal tunnel syndrome, unspecified upper limb: Secondary | ICD-10-CM | POA: Diagnosis not present

## 2015-03-04 DIAGNOSIS — R079 Chest pain, unspecified: Secondary | ICD-10-CM | POA: Diagnosis not present

## 2015-03-04 DIAGNOSIS — R202 Paresthesia of skin: Secondary | ICD-10-CM | POA: Diagnosis not present

## 2015-03-04 DIAGNOSIS — F329 Major depressive disorder, single episode, unspecified: Secondary | ICD-10-CM | POA: Diagnosis not present

## 2015-03-04 DIAGNOSIS — K219 Gastro-esophageal reflux disease without esophagitis: Secondary | ICD-10-CM | POA: Diagnosis not present

## 2015-03-04 DIAGNOSIS — G894 Chronic pain syndrome: Secondary | ICD-10-CM | POA: Diagnosis not present

## 2015-03-04 DIAGNOSIS — H905 Unspecified sensorineural hearing loss: Secondary | ICD-10-CM | POA: Diagnosis not present

## 2015-03-04 DIAGNOSIS — E669 Obesity, unspecified: Secondary | ICD-10-CM | POA: Diagnosis not present

## 2015-03-04 DIAGNOSIS — M797 Fibromyalgia: Secondary | ICD-10-CM | POA: Diagnosis not present

## 2015-03-05 ENCOUNTER — Encounter: Payer: Self-pay | Admitting: Family Medicine

## 2015-03-05 DIAGNOSIS — R079 Chest pain, unspecified: Secondary | ICD-10-CM | POA: Diagnosis not present

## 2015-03-05 DIAGNOSIS — Z136 Encounter for screening for cardiovascular disorders: Secondary | ICD-10-CM | POA: Insufficient documentation

## 2015-03-10 DIAGNOSIS — N181 Chronic kidney disease, stage 1: Secondary | ICD-10-CM | POA: Diagnosis not present

## 2015-03-11 ENCOUNTER — Encounter: Payer: Self-pay | Admitting: Pulmonary Disease

## 2015-03-11 ENCOUNTER — Ambulatory Visit (INDEPENDENT_AMBULATORY_CARE_PROVIDER_SITE_OTHER): Payer: Medicare Other | Admitting: Pulmonary Disease

## 2015-03-11 VITALS — BP 132/76 | HR 83 | Ht 68.0 in | Wt 231.0 lb

## 2015-03-11 DIAGNOSIS — J383 Other diseases of vocal cords: Secondary | ICD-10-CM | POA: Diagnosis not present

## 2015-03-11 DIAGNOSIS — J690 Pneumonitis due to inhalation of food and vomit: Secondary | ICD-10-CM | POA: Diagnosis not present

## 2015-03-11 DIAGNOSIS — R0602 Shortness of breath: Secondary | ICD-10-CM

## 2015-03-11 DIAGNOSIS — R918 Other nonspecific abnormal finding of lung field: Secondary | ICD-10-CM

## 2015-03-11 MED ORDER — METHOTREXATE (ANTI-RHEUMATIC) 2.5 MG PO TABS: ORAL_TABLET | ORAL | Status: DC

## 2015-03-11 NOTE — Addendum Note (Signed)
Addended by: Simonne Maffucci B on: 03/11/2015 10:50 AM   Modules accepted: Orders, Medications

## 2015-03-11 NOTE — Assessment & Plan Note (Signed)
There is no evidence of esophageal pathology after endoscopy by [facility] gastroenterology. She will continue speech therapy at the voice center at [facility].

## 2015-03-11 NOTE — Patient Instructions (Addendum)
Try Dulera 2 puffs twice a day no matter how you feel We will arrange a pulmonary function test and call you with the results We will arrange a CT scan of your chest to follow-up the pulmonary nodule for December 2016 Use the albuterol as needed for shortness of breath Stay active, exercise regularly We will see you back in 6 months or sooner if needed

## 2015-03-11 NOTE — Assessment & Plan Note (Signed)
She had a new pulmonary nodule seen on the December 2015 CT chest which was 5 mm in size and felt to be likely inflammatory. We will repeat a CT chest in December 2016 to ensure that this has not increased in size.

## 2015-03-11 NOTE — Assessment & Plan Note (Signed)
We have looked on multiple occasions for evidence of lung disease and have found pulmonary restriction. Multiple CT scans have shown no clear evidence of underlying interstitial lung disease but there has been repeated suggestions of chronic aspiration as there is been mild groundglass changes in the right lower lobe as well as fleeting nodules.  She continues to complain of shortness of breath which I believe is primarily due to obesity and deconditioning. There may be a component of mild asthma. She says that she gets benefit from albuterol.  Because of her underlying history of connective tissue disease I've had concern for interstitial lung disease but there is never been clear evidence of this.  Plan: Because of her underlying connective tissue disease and going to repeat pulmonary function testing to ensure that there is no evidence of worsening restriction Trial of Dulera Continue albuterol regularly Continue therapy from the voice center [facility] for vocal cord dysfunction Continue regular exercise and weight loss Follow-up 6 months or sooner if needed

## 2015-03-11 NOTE — Progress Notes (Signed)
Subjective:    Patient ID: Nicole Ballard, female    DOB: [birth year], 59 y.o.   MRN: [id number]  Synopsis: Nicole Ballard first saw the [facility] Pulmonary office in February 2014 for evaluation of shortness of breath and wheezing.  She had been hospitalized at [facility] in January 2014 for "possible pneumonia" and had a CXR with intersitial opacities and bilateral hilar adenopathy.  She was diagnosed with Lupus in 2005 by [facility] and was treated with Plaquenil.  She has also been diagnosed with vocal cord dysfunction by [facility] and in Feb 2014 was undergoing behavioral therapy.  11/2012 PFT's at [facility] showed moderate to severe restriction with a depressed ERV and DLCO. 11/2012 CT chest just showed resolving post infectious changes and no clear lymphadenopathy or interstitial lung disease.  Vocal cord dysfunction behavioral therapy at [facility] has been helpful.  HPI  Chief Complaint  Patient presents with  . Follow-up    pt c/o sob with exertion- using rescue inhaler 1-2X q2w.  voice has improved.    Nicole Ballard has been doingOK.  She has completed voice therapy at [facility] ([facility] and it is really helping with her hoarseness, but she still feels some shortness of breath from time to time.  She has tried walking, but she is limited by her fibromyalgia and she says that her lupus flares when she goes out.  This limits her mobility. She walked 1-2 miles a day with a friend last month, albuterol helped prior to this, she has increased to 1-2 miles a day. She is seeing a nutritionist to help lose weight.  She continues to use albuterol several times per week.  Past Medical History  Diagnosis Date  . Fibromyalgia   . Paresthesia   . GERD (gastroesophageal reflux disease)   . Rhinitis   . Systemic lupus erythematosus   . Hemifacial spasm   . Chronic kidney disease   . Chronic pain   . Anxiety   . Vitamin D deficiency   . Bipolar 1 disorder   . Hypertension   . Osteoarthritis   . Lumbago   . Insomnia   .  Hyperlipidemia   . Hearing loss of right ear      Review of Systems  Constitutional: Negative for fever, chills and unexpected weight change.  HENT: Negative for congestion, postnasal drip, rhinorrhea and sneezing.   Respiratory: Positive for shortness of breath. Negative for chest tightness and wheezing.   Cardiovascular: Negative for chest pain and leg swelling.       Objective:   Physical Exam Filed Vitals:   03/11/15 0945  BP: 132/76  Pulse: 83  Height: 5\' 8"  (1.727 m)  Weight: 231 lb (104.781 kg)  SpO2: 97%   room air  Gen: obese, well appearing, no acute distress HEENT: NCAT, OP clear, EOMi PULM: CTA B CV: RRR, no mgr, no JVD AB: BS+, soft, nontender Ext: warm, no edema, no clubbing, no cyanosis Neuro: A&Ox4, maew    January 2014 CXR > increased interstitial markings bilaterally, mediastinal lymphadenopathy 11/20/2012 Full PFT > ratio 77%, FEV1 1.57 L (57% predicted) total lung capacity 3.52 L (61% predicted), ERV 0.29 L (24% predicted), DLCO 16.6 (54% predicted) 11/23/12 CT chest >> no mediastinal lymphadenopathy; some increased interstitial markings and scattered ggo in R lung favored to be post infectious; few scattered pulmonary nodules, the largest of which is 42mm; some air trapping in bases 11/20/2012 Full PFT at [facility] > Ratio 77%, FEV1 1.57 (57%) FEF 25-75 1.36 (45%); TLC 3.52  L (61% pred), ERV 0.29 (24% pred), DLCO 16.6 (54% pred);  02/2014 PFT [facility] > Ratio 83%, FEV1 1.67L (61% pred), TLC 2.81 L (49% pred), ERV 0.36L (30% pred), DLCO 16.9 (61% pred) 03/2014 Nuclear stress test> normal 08/2014 CT chest > GGO RLL predominantly, RML nodule smaller; radiology favors chronic aspiration, less likely NSIP or COP, 34mm nodules LLL (personally reviewed)  Nephrology visit from last month reviewed, followed for CKD 1 related to HTN and SLE    Assessment & Plan:   Shortness of breath We have looked on multiple occasions for evidence of lung disease and have found pulmonary  restriction. Multiple CT scans have shown no clear evidence of underlying interstitial lung disease but there has been repeated suggestions of chronic aspiration as there is been mild groundglass changes in the right lower lobe as well as fleeting nodules.  She continues to complain of shortness of breath which I believe is primarily due to obesity and deconditioning. There may be a component of mild asthma. She says that she gets benefit from albuterol.  Because of her underlying history of connective tissue disease I've had concern for interstitial lung disease but there is never been clear evidence of this.  Plan: Because of her underlying connective tissue disease and going to repeat pulmonary function testing to ensure that there is no evidence of worsening restriction Trial of Dulera Continue albuterol regularly Continue therapy from the voice center [facility] for vocal cord dysfunction Continue regular exercise and weight loss Follow-up 6 months or sooner if needed   Vocal cord dysfunction Continue voice therapy from the [place] at [facility]  Pulmonary nodules She had a new pulmonary nodule seen on the December 2015 CT chest which was 5 mm in size and felt to be likely inflammatory. We will repeat a CT chest in December 2016 to ensure that this has not increased in size.  Aspiration pneumonitis There is no evidence of esophageal pathology after endoscopy by [facility] gastroenterology. She will continue speech therapy at the voice center at [facility].    Updated Medication List Outpatient Encounter Prescriptions as of 03/11/2015  Medication Sig  . albuterol (PROAIR HFA) 108 (90 BASE) MCG/ACT inhaler Inhale 2 puffs into the lungs every 6 (six) hours as needed for wheezing. DX code R06.02  . Cholecalciferol (VITAMIN D) 2000 UNITS CAPS Take 1 capsule by mouth daily.  . clobetasol cream (TEMOVATE) 7.37 % Apply 1 application topically 2 (two) times daily.  . clonazePAM (KLONOPIN)  1 MG tablet Take 1 mg by mouth. 1 tab qam, 2 tab qhs  . docusate sodium (COLACE) 100 MG capsule Take 2 capsules (200 mg total) by mouth 2 (two) times daily.  . DULoxetine (CYMBALTA) 60 MG capsule Take 120 mg by mouth at bedtime.   . fluticasone (FLONASE) 50 MCG/ACT nasal spray Place 2 sprays into the nose daily as needed.   . hydroxychloroquine (PLAQUENIL) 200 MG tablet Take 200 mg by mouth 2 (two) times daily.  Marland Kitchen losartan-hydrochlorothiazide (HYZAAR) 100-25 MG per tablet Take 1 tablet by mouth daily.  . metoCLOPramide (REGLAN) 10 MG tablet Take 1 tablet (10 mg total) by mouth 4 (four) times daily -  before meals and at bedtime.  . Multiple Vitamin (MULTIVITAMIN) tablet Take 1 tablet by mouth daily.  Marland Kitchen omeprazole (PRILOSEC) 40 MG capsule Take 40 mg by mouth daily.  . polyethylene glycol powder (GLYCOLAX/MIRALAX) powder 2 cap fulls in a full glass of water, three times a day, for 5  days.  . traZODone (DESYREL) 150 MG tablet Take 150 mg by mouth at bedtime.  . methotrexate (RHEUMATREX) 2.5 MG tablet Take 7.5mg  by mouth once per week for two weeks, Then take 10mg  by mouth once per week for two weeks,  Then take 12.5mg  by mouth once per week for two weeks, Then take 15mg  by mouth once per week  . [DISCONTINUED] baclofen (LIORESAL) 10 MG tablet Take 10 mg by mouth 2 (two) times daily.  . [DISCONTINUED] chlorpheniramine-HYDROcodone (TUSSIONEX PENNKINETIC ER) 10-8 MG/5ML LQCR Take 5 mLs by mouth at bedtime as needed for cough. (Patient not taking: Reported on 03/11/2015)  . [DISCONTINUED] cyclobenzaprine (FLEXERIL) 10 MG tablet Take 10 mg by mouth 3 (three) times daily as needed for muscle spasms.  . [DISCONTINUED] ondansetron (ZOFRAN) 4 MG tablet TAKE 1 TABLET BY MOUTH TWICE DAILY (Patient not taking: Reported on 03/11/2015)  . [DISCONTINUED] polyethylene glycol-electrolytes (NULYTELY/GOLYTELY) 420 G solution Take 4,000 mLs by mouth 3 (three) times daily.   . [DISCONTINUED] senna (SENOKOT) 8.6 MG TABS  tablet Take 2 tablets (17.2 mg total) by mouth 2 (two) times daily. (Patient not taking: Reported on 03/11/2015)   No facility-administered encounter medications on file as of 03/11/2015.

## 2015-03-11 NOTE — Assessment & Plan Note (Addendum)
Continue voice therapy from the [place] at [facility]

## 2015-03-12 ENCOUNTER — Ambulatory Visit: Payer: Medicare Other | Admitting: Pulmonary Disease

## 2015-03-16 ENCOUNTER — Other Ambulatory Visit: Payer: Self-pay | Admitting: *Deleted

## 2015-03-16 ENCOUNTER — Ambulatory Visit (INDEPENDENT_AMBULATORY_CARE_PROVIDER_SITE_OTHER): Payer: Medicare Other | Admitting: Pulmonary Disease

## 2015-03-16 DIAGNOSIS — R0602 Shortness of breath: Secondary | ICD-10-CM

## 2015-03-16 LAB — PULMONARY FUNCTION TEST
DL/VA % pred: 120 %
DL/VA: 6.31 ml/min/mmHg/L
DLCO unc % pred: 63 %
DLCO unc: 18.75 ml/min/mmHg
FEF 25-75 PRE: 1.73 L/s
FEF 25-75 Post: 2.23 L/sec
FEF2575-%Change-Post: 29 %
FEF2575-%PRED-PRE: 69 %
FEF2575-%Pred-Post: 90 %
FEV1-%Change-Post: 5 %
FEV1-%PRED-POST: 64 %
FEV1-%PRED-PRE: 60 %
FEV1-Post: 1.63 L
FEV1-Pre: 1.53 L
FEV1FVC-%CHANGE-POST: 1 %
FEV1FVC-%PRED-PRE: 104 %
FEV6-%CHANGE-POST: 4 %
FEV6-%Pred-Post: 62 %
FEV6-%Pred-Pre: 59 %
FEV6-Post: 1.93 L
FEV6-Pre: 1.84 L
FEV6FVC-%PRED-POST: 103 %
FEV6FVC-%Pred-Pre: 103 %
FVC-%Change-Post: 4 %
FVC-%Pred-Post: 60 %
FVC-%Pred-Pre: 57 %
FVC-POST: 1.93 L
FVC-PRE: 1.84 L
POST FEV6/FVC RATIO: 100 %
Post FEV1/FVC ratio: 84 %
Pre FEV1/FVC ratio: 83 %
Pre FEV6/FVC Ratio: 100 %
RV % pred: 55 %
RV: 1.17 L
TLC % PRED: 55 %
TLC: 3.15 L

## 2015-03-16 MED ORDER — MOMETASONE FURO-FORMOTEROL FUM 200-5 MCG/ACT IN AERO: 2.0000 | INHALATION_SPRAY | Freq: Two times a day (BID) | RESPIRATORY_TRACT | Status: DC

## 2015-03-16 NOTE — Progress Notes (Signed)
PFT Performed today.

## 2015-03-19 DIAGNOSIS — J3801 Paralysis of vocal cords and larynx, unilateral: Secondary | ICD-10-CM | POA: Diagnosis not present

## 2015-03-19 DIAGNOSIS — R490 Dysphonia: Secondary | ICD-10-CM | POA: Diagnosis not present

## 2015-03-23 ENCOUNTER — Telehealth: Payer: Self-pay | Admitting: *Deleted

## 2015-03-23 NOTE — Telephone Encounter (Signed)
Received PA request for [facility] from [facility]. PA submitted via [email or web address] Key: [id number] Pt has tried and failed Symbicort and Albuterol in the past. Ins Id: [id number]

## 2015-03-23 NOTE — Telephone Encounter (Signed)
Nicole Ballard has been approved for 12 mos under Med Part D thru 03/22/16. Pt and phamacy notified. Approval # [id number]

## 2015-03-25 ENCOUNTER — Telehealth: Payer: Self-pay

## 2015-03-25 NOTE — Telephone Encounter (Signed)
-----   Message from Juanito Doom, MD sent at 03/25/2015 11:21 AM EDT ----- A, Please let her know that her PFT was actually a little better than last year. Thanks B

## 2015-03-25 NOTE — Telephone Encounter (Signed)
Pt aware of results.  Nothing further needed.  

## 2015-03-28 DIAGNOSIS — Z803 Family history of malignant neoplasm of breast: Secondary | ICD-10-CM | POA: Diagnosis not present

## 2015-03-28 DIAGNOSIS — Z888 Allergy status to other drugs, medicaments and biological substances status: Secondary | ICD-10-CM | POA: Diagnosis not present

## 2015-03-28 DIAGNOSIS — Z880 Allergy status to penicillin: Secondary | ICD-10-CM | POA: Diagnosis not present

## 2015-03-28 DIAGNOSIS — Z823 Family history of stroke: Secondary | ICD-10-CM | POA: Diagnosis not present

## 2015-03-28 DIAGNOSIS — M1990 Unspecified osteoarthritis, unspecified site: Secondary | ICD-10-CM | POA: Diagnosis not present

## 2015-03-28 DIAGNOSIS — Z800 Family history of malignant neoplasm of digestive organs: Secondary | ICD-10-CM | POA: Diagnosis not present

## 2015-03-28 DIAGNOSIS — F410 Panic disorder [episodic paroxysmal anxiety] without agoraphobia: Secondary | ICD-10-CM | POA: Diagnosis not present

## 2015-03-28 DIAGNOSIS — N181 Chronic kidney disease, stage 1: Secondary | ICD-10-CM | POA: Diagnosis not present

## 2015-03-28 DIAGNOSIS — K219 Gastro-esophageal reflux disease without esophagitis: Secondary | ICD-10-CM | POA: Diagnosis not present

## 2015-03-28 DIAGNOSIS — Z8249 Family history of ischemic heart disease and other diseases of the circulatory system: Secondary | ICD-10-CM | POA: Diagnosis not present

## 2015-03-28 DIAGNOSIS — H905 Unspecified sensorineural hearing loss: Secondary | ICD-10-CM | POA: Diagnosis not present

## 2015-03-28 DIAGNOSIS — Z79899 Other long term (current) drug therapy: Secondary | ICD-10-CM | POA: Diagnosis not present

## 2015-03-28 DIAGNOSIS — J45909 Unspecified asthma, uncomplicated: Secondary | ICD-10-CM | POA: Diagnosis not present

## 2015-03-28 DIAGNOSIS — M329 Systemic lupus erythematosus, unspecified: Secondary | ICD-10-CM | POA: Diagnosis not present

## 2015-03-28 DIAGNOSIS — I129 Hypertensive chronic kidney disease with stage 1 through stage 4 chronic kidney disease, or unspecified chronic kidney disease: Secondary | ICD-10-CM | POA: Diagnosis not present

## 2015-03-28 DIAGNOSIS — F329 Major depressive disorder, single episode, unspecified: Secondary | ICD-10-CM | POA: Diagnosis not present

## 2015-03-28 DIAGNOSIS — K5900 Constipation, unspecified: Secondary | ICD-10-CM | POA: Diagnosis not present

## 2015-03-28 DIAGNOSIS — Z833 Family history of diabetes mellitus: Secondary | ICD-10-CM | POA: Diagnosis not present

## 2015-03-28 DIAGNOSIS — R51 Headache: Secondary | ICD-10-CM | POA: Diagnosis not present

## 2015-03-28 DIAGNOSIS — M797 Fibromyalgia: Secondary | ICD-10-CM | POA: Diagnosis not present

## 2015-04-11 ENCOUNTER — Encounter: Payer: Self-pay | Admitting: Family Medicine

## 2015-04-11 DIAGNOSIS — G9519 Other vascular myelopathies: Secondary | ICD-10-CM | POA: Insufficient documentation

## 2015-04-11 DIAGNOSIS — M48062 Spinal stenosis, lumbar region with neurogenic claudication: Secondary | ICD-10-CM | POA: Insufficient documentation

## 2015-04-11 DIAGNOSIS — IMO0001 Reserved for inherently not codable concepts without codable children: Secondary | ICD-10-CM | POA: Insufficient documentation

## 2015-04-11 DIAGNOSIS — G8929 Other chronic pain: Secondary | ICD-10-CM | POA: Insufficient documentation

## 2015-04-11 DIAGNOSIS — L858 Other specified epidermal thickening: Secondary | ICD-10-CM | POA: Insufficient documentation

## 2015-04-11 DIAGNOSIS — N181 Chronic kidney disease, stage 1: Secondary | ICD-10-CM | POA: Insufficient documentation

## 2015-04-11 DIAGNOSIS — E8881 Metabolic syndrome: Secondary | ICD-10-CM | POA: Insufficient documentation

## 2015-04-11 DIAGNOSIS — H9190 Unspecified hearing loss, unspecified ear: Secondary | ICD-10-CM | POA: Insufficient documentation

## 2015-04-11 DIAGNOSIS — E785 Hyperlipidemia, unspecified: Secondary | ICD-10-CM | POA: Insufficient documentation

## 2015-04-11 DIAGNOSIS — M545 Low back pain, unspecified: Secondary | ICD-10-CM | POA: Insufficient documentation

## 2015-04-11 DIAGNOSIS — J302 Other seasonal allergic rhinitis: Secondary | ICD-10-CM | POA: Insufficient documentation

## 2015-04-11 DIAGNOSIS — G4700 Insomnia, unspecified: Secondary | ICD-10-CM | POA: Insufficient documentation

## 2015-04-11 DIAGNOSIS — E559 Vitamin D deficiency, unspecified: Secondary | ICD-10-CM | POA: Insufficient documentation

## 2015-04-11 DIAGNOSIS — L210 Seborrhea capitis: Secondary | ICD-10-CM | POA: Insufficient documentation

## 2015-04-11 DIAGNOSIS — R490 Dysphonia: Secondary | ICD-10-CM | POA: Insufficient documentation

## 2015-04-11 DIAGNOSIS — N901 Moderate vulvar dysplasia: Secondary | ICD-10-CM | POA: Insufficient documentation

## 2015-04-11 DIAGNOSIS — K5909 Other constipation: Secondary | ICD-10-CM | POA: Insufficient documentation

## 2015-04-11 DIAGNOSIS — R32 Unspecified urinary incontinence: Secondary | ICD-10-CM | POA: Insufficient documentation

## 2015-04-11 DIAGNOSIS — J3089 Other allergic rhinitis: Secondary | ICD-10-CM

## 2015-04-11 DIAGNOSIS — M797 Fibromyalgia: Secondary | ICD-10-CM | POA: Insufficient documentation

## 2015-04-11 DIAGNOSIS — L304 Erythema intertrigo: Secondary | ICD-10-CM | POA: Insufficient documentation

## 2015-04-11 DIAGNOSIS — F339 Major depressive disorder, recurrent, unspecified: Secondary | ICD-10-CM | POA: Insufficient documentation

## 2015-04-11 DIAGNOSIS — M48061 Spinal stenosis, lumbar region without neurogenic claudication: Secondary | ICD-10-CM | POA: Insufficient documentation

## 2015-04-11 DIAGNOSIS — R809 Proteinuria, unspecified: Secondary | ICD-10-CM | POA: Insufficient documentation

## 2015-04-11 DIAGNOSIS — K219 Gastro-esophageal reflux disease without esophagitis: Secondary | ICD-10-CM | POA: Insufficient documentation

## 2015-04-11 DIAGNOSIS — R9389 Abnormal findings on diagnostic imaging of other specified body structures: Secondary | ICD-10-CM | POA: Insufficient documentation

## 2015-04-16 ENCOUNTER — Encounter: Payer: Self-pay | Admitting: Family Medicine

## 2015-04-16 ENCOUNTER — Ambulatory Visit (INDEPENDENT_AMBULATORY_CARE_PROVIDER_SITE_OTHER): Payer: Medicare Other | Admitting: Family Medicine

## 2015-04-16 VITALS — BP 120/72 | HR 94 | Temp 98.7°F | Resp 18 | Ht 66.0 in | Wt 234.5 lb

## 2015-04-16 DIAGNOSIS — K5900 Constipation, unspecified: Secondary | ICD-10-CM

## 2015-04-16 DIAGNOSIS — K146 Glossodynia: Secondary | ICD-10-CM | POA: Diagnosis not present

## 2015-04-16 DIAGNOSIS — K5909 Other constipation: Secondary | ICD-10-CM

## 2015-04-16 DIAGNOSIS — Z23 Encounter for immunization: Secondary | ICD-10-CM | POA: Diagnosis not present

## 2015-04-16 DIAGNOSIS — R110 Nausea: Secondary | ICD-10-CM

## 2015-04-16 MED ORDER — LINACLOTIDE 290 MCG PO CAPS: 290.0000 ug | ORAL_CAPSULE | Freq: Every day | ORAL | Status: DC

## 2015-04-16 MED ORDER — LINZESS 145 MCG PO CAPS: 290.0000 ug | ORAL_CAPSULE | Freq: Every day | ORAL | Status: DC

## 2015-04-16 NOTE — Progress Notes (Signed)
Name: Nicole Ballard   MRN: [id number]    DOB: 02-Mar-1956   Date:04/16/2015       Progress Note  Subjective  Chief Complaint  Chief Complaint  Patient presents with  . Constipation    ongoing, worsening,and bloating is currently taking linzess with no relief.  Only thing that seems to work is a suppository  . Nausea    and tongue burns all the time    HPI  Constipation: she went to [facility] for constipation a couple weeks ago for severe constipation. She states she going up to one week without a bowel movement and has to use rectal suppositories, taking Miralax three times daily, also taking stool softeners twice daily and is also taking Linzess 145 mg daily given by [facility] but still only having bowel movement once a week with the rectal suppository.  No blood in stools, states stools are soft when she has a bowel movement but has some abdominal discomfort and nausea. She has GI in the past, last visit a couple of months ago and was advised to keep follow up.   Tongue burning: seen by ENT and was advised to use saline spray, and flonase and was advised to follow up with me to have labs done  Nausea: chronic , daily, taking medication daily and states symptoms are worse with increase in constipation  Depression: not happy with current care at [facility] because she always gets a new provider. Explained that most practices are like that now.  Patient Active Problem List   Diagnosis Date Noted  . Abnormal CAT scan 04/11/2015  . Auditory impairment 04/11/2015  . Insomnia, persistent 04/11/2015  . Chronic kidney disease (CKD), stage I 04/11/2015  . Chronic nonmalignant pain 04/11/2015  . Chronic constipation 04/11/2015  . Dyslipidemia 04/11/2015  . Fibromyalgia 04/11/2015  . Gastro-esophageal reflux disease without esophagitis 04/11/2015  . Dysphonia 04/11/2015  . Absence of bladder continence 04/11/2015  . LBP (low back pain) 04/11/2015  . Eczema intertrigo 04/11/2015  . Keratosis pilaris  04/11/2015  . Chronic recurrent major depressive disorder 04/11/2015  . Dysmetabolic syndrome 13/04/6577  . Neurogenic claudication 04/11/2015  . Perennial allergic rhinitis with seasonal variation 04/11/2015  . Abnormal presence of protein in urine 04/11/2015  . Seborrhea capitis 04/11/2015  . Moderate dysplasia of vulva 04/11/2015  . Vitamin D deficiency 04/11/2015  . Treadmill stress test negative for angina pectoris 03/05/2015  . Shortness of breath 05/20/2014  . Adult BMI 30+ 02/03/2014  . Asthma, chronic 12/31/2013  . H/O total knee replacement 12/31/2013  . Episcleritis 09/26/2013  . Vocal cord dysfunction 05/28/2013  . Anxiety, generalized 03/19/2013  . Back pain 12/18/2012  . Pulmonary nodules 11/26/2012  . Lung nodule, multiple 11/26/2012  . Abnormal lung field 11/26/2012  . Arthritis, degenerative 11/01/2012  . H/O aspiration pneumonitis 10/22/2012  . Postinflammatory pulmonary fibrosis 10/22/2012  . Aspiration pneumonitis 10/22/2012  . Mixed incontinence 05/04/2012  . Benign neoplasm of pituitary gland and craniopharyngeal duct 12/29/2010  . Clinical depression 11/05/2010  . Inflammatory autoimmune disorder 11/05/2010  . Chronic nausea 07/01/2008  . Benign hypertension 01/25/2005    Past Surgical History  Procedure Laterality Date  . Cholecystectomy    . Vaginal hysterectomy    . Brain surgery      Family History  Problem Relation Age of Onset  . Breast cancer Mother   . Cancer Mother 65    Breast  . Cancer Father 22    Stomach  . Breast cancer Maternal  Aunt     Social History   Social History  . Marital Status: Divorced    Spouse Name: N/A  . Number of Children: N/A  . Years of Education: N/A   Occupational History  . Not on file.   Social History Main Topics  . Smoking status: Never Smoker   . Smokeless tobacco: Never Used  . Alcohol Use: No  . Drug Use: No  . Sexual Activity: Not Currently   Other Topics Concern  . Not on file    Social History Narrative     Current outpatient prescriptions:  .  albuterol (PROAIR HFA) 108 (90 BASE) MCG/ACT inhaler, Inhale 2 puffs into the lungs every 6 (six) hours as needed for wheezing. DX code R06.02, Disp: 3 Inhaler, Rfl: 3 .  Cholecalciferol (VITAMIN D) 2000 UNITS CAPS, Take 1 capsule by mouth daily., Disp: , Rfl:  .  clobetasol cream (TEMOVATE) 6.43 %, Apply 1 application topically 2 (two) times daily., Disp: , Rfl:  .  clonazePAM (KLONOPIN) 1 MG tablet, Take 1 mg by mouth. 1 tab qam, 2 tab qhs, Disp: , Rfl:  .  docusate sodium (COLACE) 100 MG capsule, Take 2 capsules (200 mg total) by mouth 2 (two) times daily., Disp: 120 capsule, Rfl: 0 .  DULoxetine (CYMBALTA) 60 MG capsule, Take 120 mg by mouth at bedtime. , Disp: , Rfl:  .  fluticasone (FLONASE) 50 MCG/ACT nasal spray, Place 2 sprays into the nose daily as needed. , Disp: , Rfl:  .  hydroxychloroquine (PLAQUENIL) 200 MG tablet, Take 200 mg by mouth 2 (two) times daily., Disp: , Rfl:  .  losartan-hydrochlorothiazide (HYZAAR) 100-25 MG per tablet, Take 1 tablet by mouth daily., Disp: , Rfl:  .  metoCLOPramide (REGLAN) 10 MG tablet, Take 1 tablet (10 mg total) by mouth 4 (four) times daily -  before meals and at bedtime., Disp: 60 tablet, Rfl: 0 .  mometasone-formoterol (DULERA) 200-5 MCG/ACT AERO, Inhale 2 puffs into the lungs 2 (two) times daily., Disp: 13 g, Rfl: 2 .  omeprazole (PRILOSEC) 40 MG capsule, Take 40 mg by mouth daily., Disp: , Rfl:  .  polyethylene glycol powder (GLYCOLAX/MIRALAX) powder, 2 cap fulls in a full glass of water, three times a day, for 5 days., Disp: 255 g, Rfl: 0 .  traZODone (DESYREL) 150 MG tablet, Take 150 mg by mouth at bedtime., Disp: , Rfl:  .  lamoTRIgine (LAMICTAL) 100 MG tablet, Take 1 tablet by mouth 3 (three) times daily., Disp: , Rfl:  .  LINZESS 145 MCG CAPS capsule, Take 2 capsules (290 mcg total) by mouth daily., Disp: 30 capsule, Rfl: 2 .  Multiple Vitamin (MULTIVITAMIN) tablet,  Take 1 tablet by mouth daily., Disp: , Rfl:  .  ondansetron (ZOFRAN) 4 MG tablet, Take 1 tablet by mouth 2 (two) times daily., Disp: , Rfl:   Allergies  Allergen Reactions  . Ace Inhibitors     Other reaction(s): OTHER Pt states she can not take ace inhibitors. Pt states she can not remember her reaction.   Marland Kitchen Penicillins      ROS Constitutional: Negative for fever or weight change.  Respiratory: Negative for cough and shortness of breath.   Cardiovascular: Negative for chest pain or palpitations.  Gastrointestinal: Negative for abdominal pain, no bowel changes.  Musculoskeletal: Negative for gait problem or joint swelling.  Skin: Negative for rash.  Neurological: Negative for dizziness or headache.  No other specific complaints in a complete review of systems (except  as listed in HPI above).  Objective  Filed Vitals:   04/16/15 1031  BP: 120/72  Pulse: 94  Temp: 98.7 F (37.1 C)  TempSrc: Oral  Resp: 18  Height: _0  (1.676 m)  Weight: 234 lb 8 oz (106.369 kg)  SpO2: 95%    Body mass index is 37.87 kg/(m^2).  Physical Exam Constitutional: Patient appears well-developed and well-nourished. Obese No distress.  HEENT: head atraumatic, normocephalic, pupils equal and reactive to light,  neck supple, throat within normal limits Cardiovascular: Normal rate, regular rhythm and normal heart sounds.  No murmur heard. No BLE edema. Pulmonary/Chest: Effort normal and breath sounds normal. No respiratory distress. Abdominal: Soft.  Mild generalized tenderness, no guarding or rebound tenderness Psychiatric: Patient has a normal mood and affect. behavior is normal. Judgment and thought content normal.  Recent Results (from the past 2160 hour(s))  Basic metabolic panel     Status: Abnormal   Collection Time: 01/27/15  6:40 PM  Result Value Ref Range   Sodium 138 135 - 145 mmol/L   Potassium 3.9 3.5 - 5.1 mmol/L   Chloride 103 101 - 111 mmol/L   CO2 30 22 - 32 mmol/L    Glucose, Bld 108 (H) 65 - 99 mg/dL   BUN 15 6 - 20 mg/dL   Creatinine, Ser 0.89 0.44 - 1.00 mg/dL   Calcium 8.6 (L) 8.9 - 10.3 mg/dL   GFR calc non Af Amer >60 >60 mL/min   GFR calc Af Amer >60 >60 mL/min    Comment: (NOTE) The eGFR has been calculated using the CKD EPI equation. This calculation has not been validated in all clinical situations. eGFR's persistently <60 mL/min signify possible Chronic Kidney Disease.    Anion gap 5 5 - 15  CBC with Differential     Status: None   Collection Time: 01/27/15  6:40 PM  Result Value Ref Range   WBC 5.7 3.6 - 11.0 K/uL   RBC 4.67 3.80 - 5.20 MIL/uL   Hemoglobin 13.0 12.0 - 16.0 g/dL   HCT 39.1 35.0 - 47.0 %   MCV 83.7 80.0 - 100.0 fL   MCH 27.9 26.0 - 34.0 pg   MCHC 33.4 32.0 - 36.0 g/dL   RDW 14.0 11.5 - 14.5 %   Platelets 284 150 - 440 K/uL   Neutrophils Relative % 47 %   Neutro Abs 2.7 1.4 - 6.5 K/uL   Lymphocytes Relative 37 %   Lymphs Abs 2.1 1.0 - 3.6 K/uL   Monocytes Relative 11 %   Monocytes Absolute 0.6 0.2 - 0.9 K/uL   Eosinophils Relative 4 %   Eosinophils Absolute 0.2 0 - 0.7 K/uL   Basophils Relative 1 %   Basophils Absolute 0.1 0 - 0.1 K/uL  Hepatic function panel     Status: Abnormal   Collection Time: 01/27/15  6:40 PM  Result Value Ref Range   Total Protein 8.0 6.5 - 8.1 g/dL   Albumin 3.4 (L) 3.5 - 5.0 g/dL   AST 26 15 - 41 U/L   ALT 16 14 - 54 U/L   Alkaline Phosphatase 76 38 - 126 U/L   Total Bilirubin 0.3 0.3 - 1.2 mg/dL   Bilirubin, Direct <0.1 (L) 0.1 - 0.5 mg/dL   Indirect Bilirubin NOT CALCULATED 0.3 - 0.9 mg/dL  Lipase, blood     Status: None   Collection Time: 01/27/15  6:40 PM  Result Value Ref Range   Lipase 33 22 - 51 U/L  Magnesium     Status: None   Collection Time: 01/27/15  6:40 PM  Result Value Ref Range   Magnesium 1.9 1.7 - 2.4 mg/dL  Pulmonary function test     Status: None   Collection Time: 03/16/15  9:26 AM  Result Value Ref Range   FVC-Pre 1.84 L   FVC-%Pred-Pre 57 %    FVC-Post 1.93 L   FVC-%Pred-Post 60 %   FVC-%Change-Post 4 %   FEV1-Pre 1.53 L   FEV1-%Pred-Pre 60 %   FEV1-Post 1.63 L   FEV1-%Pred-Post 64 %   FEV1-%Change-Post 5 %   FEV6-Pre 1.84 L   FEV6-%Pred-Pre 59 %   FEV6-Post 1.93 L   FEV6-%Pred-Post 62 %   FEV6-%Change-Post 4 %   Pre FEV1/FVC ratio 83 %   FEV1FVC-%Pred-Pre 104 %   Post FEV1/FVC ratio 84 %   FEV1FVC-%Change-Post 1 %   Pre FEV6/FVC Ratio 100 %   FEV6FVC-%Pred-Pre 103 %   Post FEV6/FVC ratio 100 %   FEV6FVC-%Pred-Post 103 %   FEF 25-75 Pre 1.73 L/sec   FEF2575-%Pred-Pre 69 %   FEF 25-75 Post 2.23 L/sec   FEF2575-%Pred-Post 90 %   FEF2575-%Change-Post 29 %   RV 1.17 L   RV % pred 55 %   TLC 3.15 L   TLC % pred 55 %   DLCO unc 18.75 ml/min/mmHg   DLCO unc % pred 63 %   DL/VA 6.31 ml/min/mmHg/L   DL/VA % pred 120 %     PHQ2/9: Depression screen PHQ 2/9 04/16/2015  Decreased Interest 0  Down, Depressed, Hopeless 1  PHQ - 2 Score 1    Fall Risk: Fall Risk  04/16/2015  Falls in the past year? Yes  Number falls in past yr: 2 or more  Injury with Fall? No      Assessment & Plan  1. Chronic constipation She takes narcotics and has been on medication for a long time, also takes laxative, needs to follow up with GI, increase dose of Linzess to 290 - LINZESS 290 capsule  by mouth daily.  Dispense: 30 capsule; Refill: 2  2. Tongue burning sensation Check labs - Zinc - Comprehensive metabolic panel - Vitamin T26 - Vitamin B1  3. Chronic nausea A little worse secondary to worsening of constipation  4. Needs flu shot  - Flu Vaccine QUAD 36+ mos PF IM (Fluarix Quad PF)

## 2015-04-17 LAB — COMPREHENSIVE METABOLIC PANEL
ALK PHOS: 91 IU/L (ref 39–117)
ALT: 20 IU/L (ref 0–32)
AST: 28 IU/L (ref 0–40)
Albumin/Globulin Ratio: 1 — ABNORMAL LOW (ref 1.1–2.5)
Albumin: 4 g/dL (ref 3.5–5.5)
BILIRUBIN TOTAL: 0.2 mg/dL (ref 0.0–1.2)
BUN / CREAT RATIO: 13 (ref 9–23)
BUN: 11 mg/dL (ref 6–24)
CHLORIDE: 98 mmol/L (ref 97–108)
CO2: 29 mmol/L (ref 18–29)
Calcium: 9.6 mg/dL (ref 8.7–10.2)
Creatinine, Ser: 0.87 mg/dL (ref 0.57–1.00)
GFR calc Af Amer: 86 mL/min/{1.73_m2} (ref >59)
GFR calc non Af Amer: 74 mL/min/{1.73_m2} (ref >59)
Globulin, Total: 4 g/dL (ref 1.5–4.5)
Glucose: 78 mg/dL (ref 65–99)
POTASSIUM: 4.8 mmol/L (ref 3.5–5.2)
SODIUM: 139 mmol/L (ref 134–144)
Total Protein: 8 g/dL (ref 6.0–8.5)

## 2015-04-17 LAB — VITAMIN B12: Vitamin B-12: 847 pg/mL (ref 211–946)

## 2015-04-18 LAB — ZINC: Zinc: 80 ug/dL (ref 56–134)

## 2015-04-19 LAB — VITAMIN B1: Thiamine: 136 nmol/L (ref 66.5–200.0)

## 2015-04-21 DIAGNOSIS — M797 Fibromyalgia: Secondary | ICD-10-CM | POA: Diagnosis not present

## 2015-04-21 DIAGNOSIS — M609 Myositis, unspecified: Secondary | ICD-10-CM | POA: Diagnosis not present

## 2015-04-21 DIAGNOSIS — Z96651 Presence of right artificial knee joint: Secondary | ICD-10-CM | POA: Diagnosis not present

## 2015-04-21 DIAGNOSIS — M549 Dorsalgia, unspecified: Secondary | ICD-10-CM | POA: Diagnosis not present

## 2015-04-21 DIAGNOSIS — M791 Myalgia: Secondary | ICD-10-CM | POA: Diagnosis not present

## 2015-04-21 DIAGNOSIS — M1990 Unspecified osteoarthritis, unspecified site: Secondary | ICD-10-CM | POA: Diagnosis not present

## 2015-04-21 DIAGNOSIS — M329 Systemic lupus erythematosus, unspecified: Secondary | ICD-10-CM | POA: Diagnosis not present

## 2015-04-21 DIAGNOSIS — G894 Chronic pain syndrome: Secondary | ICD-10-CM | POA: Diagnosis not present

## 2015-04-24 DIAGNOSIS — K5900 Constipation, unspecified: Secondary | ICD-10-CM | POA: Diagnosis not present

## 2015-04-28 ENCOUNTER — Encounter: Payer: Self-pay | Admitting: Family Medicine

## 2015-04-28 ENCOUNTER — Telehealth: Payer: Self-pay | Admitting: Family Medicine

## 2015-04-28 ENCOUNTER — Ambulatory Visit
Admission: RE | Admit: 2015-04-28 | Discharge: 2015-04-28 | Disposition: A | Discharge status: Home / Self Care | Payer: Medicare Other | Source: Ambulatory Visit | Attending: Family Medicine | Admitting: Family Medicine

## 2015-04-28 ENCOUNTER — Ambulatory Visit (INDEPENDENT_AMBULATORY_CARE_PROVIDER_SITE_OTHER): Payer: Medicare Other | Admitting: Family Medicine

## 2015-04-28 VITALS — BP 118/72 | HR 81 | Temp 98.7°F | Resp 16 | Ht 68.0 in | Wt 233.5 lb

## 2015-04-28 DIAGNOSIS — J4521 Mild intermittent asthma with (acute) exacerbation: Secondary | ICD-10-CM | POA: Diagnosis not present

## 2015-04-28 DIAGNOSIS — R05 Cough: Secondary | ICD-10-CM

## 2015-04-28 DIAGNOSIS — J069 Acute upper respiratory infection, unspecified: Secondary | ICD-10-CM

## 2015-04-28 DIAGNOSIS — R058 Other specified cough: Secondary | ICD-10-CM

## 2015-04-28 MED ORDER — PREDNISONE 10 MG PO TABS: 10.0000 mg | ORAL_TABLET | Freq: Every day | ORAL | Status: DC

## 2015-04-28 MED ORDER — HYDROCOD POLST-CPM POLST ER 10-8 MG/5ML PO SUER: 5.0000 mL | Freq: Two times a day (BID) | ORAL | Status: DC

## 2015-04-28 MED ORDER — BENZONATATE 200 MG PO CAPS: 200.0000 mg | ORAL_CAPSULE | Freq: Three times a day (TID) | ORAL | Status: DC | PRN

## 2015-04-28 NOTE — Progress Notes (Signed)
Patient notified

## 2015-04-28 NOTE — Telephone Encounter (Signed)
Insurance will not cover her cough medication. Is it possible to call in something different

## 2015-04-28 NOTE — Progress Notes (Signed)
Name: Nicole Ballard   MRN: [id number]    DOB: Mar 20, 1956   Date:04/28/2015       Progress Note  Subjective  Chief Complaint  Chief Complaint  Patient presents with  . Cough    Onset-4 days, Unchanged, Green Mucus, Hacking Cough, SOB, OTC Mucinex    HPI  URI: for the past 4 days she has developed a cough, that is productive and green in color, otc medication is not helping with symptoms. She has developed now a clear rhinorrhea. No SOB, she states she has some wheezing at night. No fever, no rashes.   Asthma: seeing by pulmonologist, mild intermittent, now on [facility], she was doing well until the recent URI.    Patient Active Problem List   Diagnosis Date Noted  . Abnormal CAT scan 04/11/2015  . Auditory impairment 04/11/2015  . Insomnia, persistent 04/11/2015  . Chronic kidney disease (CKD), stage I 04/11/2015  . Chronic nonmalignant pain 04/11/2015  . Chronic constipation 04/11/2015  . Dyslipidemia 04/11/2015  . Fibromyalgia 04/11/2015  . Gastro-esophageal reflux disease without esophagitis 04/11/2015  . Dysphonia 04/11/2015  . Absence of bladder continence 04/11/2015  . LBP (low back pain) 04/11/2015  . Eczema intertrigo 04/11/2015  . Keratosis pilaris 04/11/2015  . Chronic recurrent major depressive disorder 04/11/2015  . Dysmetabolic syndrome 00/86/7619  . Neurogenic claudication 04/11/2015  . Perennial allergic rhinitis with seasonal variation 04/11/2015  . Abnormal presence of protein in urine 04/11/2015  . Seborrhea capitis 04/11/2015  . Moderate dysplasia of vulva 04/11/2015  . Vitamin D deficiency 04/11/2015  . Treadmill stress test negative for angina pectoris 03/05/2015  . Shortness of breath 05/20/2014  . Adult BMI 30+ 02/03/2014  . Asthma, chronic 12/31/2013  . H/O total knee replacement 12/31/2013  . Episcleritis 09/26/2013  . Vocal cord dysfunction 05/28/2013  . Anxiety, generalized 03/19/2013  . Back pain 12/18/2012  . Pulmonary nodules 11/26/2012   . Lung nodule, multiple 11/26/2012  . Abnormal lung field 11/26/2012  . Arthritis, degenerative 11/01/2012  . H/O aspiration pneumonitis 10/22/2012  . Postinflammatory pulmonary fibrosis 10/22/2012  . Aspiration pneumonitis 10/22/2012  . Mixed incontinence 05/04/2012  . Benign neoplasm of pituitary gland and craniopharyngeal duct 12/29/2010  . Clinical depression 11/05/2010  . Inflammatory autoimmune disorder 11/05/2010  . Chronic nausea 07/01/2008  . Benign hypertension 01/25/2005    Past Surgical History  Procedure Laterality Date  . Cholecystectomy    . Vaginal hysterectomy    . Brain surgery      Family History  Problem Relation Age of Onset  . Breast cancer Mother   . Cancer Mother 23    Breast  . Cancer Father 83    Stomach  . Breast cancer Maternal Aunt     Social History   Social History  . Marital Status: Divorced    Spouse Name: N/A  . Number of Children: N/A  . Years of Education: N/A   Occupational History  . Not on file.   Social History Main Topics  . Smoking status: Never Smoker   . Smokeless tobacco: Never Used  . Alcohol Use: No  . Drug Use: No  . Sexual Activity: Not Currently   Other Topics Concern  . Not on file   Social History Narrative     Current outpatient prescriptions:  .  albuterol (PROAIR HFA) 108 (90 BASE) MCG/ACT inhaler, Inhale 2 puffs into the lungs every 6 (six) hours as needed for wheezing. DX code R06.02, Disp: 3 Inhaler, Rfl: 3 .  Cholecalciferol (VITAMIN D) 2000 UNITS CAPS, Take 1 capsule by mouth daily., Disp: , Rfl:  .  clobetasol cream (TEMOVATE) 7.82 %, Apply 1 application topically 2 (two) times daily., Disp: , Rfl:  .  clonazePAM (KLONOPIN) 0.5 MG tablet, , Disp: , Rfl: 2 .  diclofenac sodium (VOLTAREN) 1 % GEL, APP 2 GRAMS EXT AA QID, Disp: , Rfl: 6 .  docusate sodium (COLACE) 100 MG capsule, Take 2 capsules (200 mg total) by mouth 2 (two) times daily., Disp: 120 capsule, Rfl: 0 .  DULoxetine (CYMBALTA) 60  MG capsule, Take 120 mg by mouth at bedtime. , Disp: , Rfl:  .  fluticasone (FLONASE) 50 MCG/ACT nasal spray, Place 2 sprays into the nose daily as needed. , Disp: , Rfl:  .  gabapentin (NEURONTIN) 300 MG capsule, Take 300 mg by mouth., Disp: , Rfl:  .  Glycerin, Laxative, (RA GLYCERIN ADULT) 80.7 % SUPP, Place rectally., Disp: , Rfl:  .  hydroxychloroquine (PLAQUENIL) 200 MG tablet, Take 200 mg by mouth 2 (two) times daily., Disp: , Rfl:  .  lamoTRIgine (LAMICTAL) 100 MG tablet, Take 1 tablet by mouth 3 (three) times daily., Disp: , Rfl:  .  losartan-hydrochlorothiazide (HYZAAR) 100-25 MG per tablet, Take 1 tablet by mouth daily., Disp: , Rfl:  .  metoCLOPramide (REGLAN) 10 MG tablet, Take 1 tablet (10 mg total) by mouth 4 (four) times daily -  before meals and at bedtime., Disp: 60 tablet, Rfl: 0 .  mometasone-formoterol (DULERA) 200-5 MCG/ACT AERO, Inhale 2 puffs into the lungs 2 (two) times daily., Disp: 13 g, Rfl: 2 .  Multiple Vitamin (MULTIVITAMIN) tablet, Take 1 tablet by mouth daily., Disp: , Rfl:  .  omeprazole (PRILOSEC) 40 MG capsule, Take 40 mg by mouth daily., Disp: , Rfl:  .  ondansetron (ZOFRAN) 4 MG tablet, Take 1 tablet by mouth 2 (two) times daily., Disp: , Rfl:  .  polyethylene glycol powder (GLYCOLAX/MIRALAX) powder, 2 cap fulls in a full glass of water, three times a day, for 5 days., Disp: 255 g, Rfl: 0 .  traZODone (DESYREL) 150 MG tablet, Take 150 mg by mouth at bedtime., Disp: , Rfl:  .  chlorpheniramine-HYDROcodone (TUSSIONEX PENNKINETIC ER) 10-8 MG/5ML SUER, Take 5 mLs by mouth 2 (two) times daily., Disp: 140 mL, Rfl: 0 .  predniSONE (DELTASONE) 10 MG tablet, Take 1 tablet (10 mg total) by mouth daily with breakfast., Disp: 6 tablet, Rfl: 0  Allergies  Allergen Reactions  . Ace Inhibitors     Other reaction(s): OTHER Pt states she can not take ace inhibitors. Pt states she can not remember her reaction.   Marland Kitchen Penicillins      ROS  Constitutional: Negative for  fever or weight change.  Respiratory: Productive  for cough and shortness of breath with coughing spells only   Cardiovascular: Negative for chest pain or palpitations.  Gastrointestinal: Negative for abdominal pain, no bowel changes.  Musculoskeletal: Negative for gait problem or joint swelling.  Skin: Negative for rash.  Neurological: Negative for dizziness positive for mild  headache.  No other specific complaints in a complete review of systems (except as listed in HPI above).  Objective  Filed Vitals:   04/28/15 1150  BP: 118/72  Pulse: 81  Temp: 98.7 F (37.1 C)  TempSrc: Oral  Resp: 16  Height: 5\' 8"  (1.727 m)  Weight: 233 lb 8 oz (105.915 kg)  SpO2: 95%    Body mass index is 35.51 kg/(m^2).  Physical Exam  Constitutional: Patient appears well-developed and well-nourished. Obese No distress.  HEENT: head atraumatic, normocephalic, pupils equal and reactive to light, ears normal TM neck supple, throat within normal limits Clear rhinorrhea Cardiovascular: Normal rate, regular rhythm and normal heart sounds.  No murmur heard. No BLE edema. Pulmonary/Chest: Effort normal and breath sounds some rhonchi posteriorly . No respiratory distress. Abdominal: Soft.  There is no tenderness. Psychiatric: Patient has a normal mood and affect. behavior is normal. Judgment and thought content normal.  Recent Results (from the past 2160 hour(s))  Pulmonary function test     Status: None   Collection Time: 03/16/15  9:26 AM  Result Value Ref Range   FVC-Pre 1.84 L   FVC-%Pred-Pre 57 %   FVC-Post 1.93 L   FVC-%Pred-Post 60 %   FVC-%Change-Post 4 %   FEV1-Pre 1.53 L   FEV1-%Pred-Pre 60 %   FEV1-Post 1.63 L   FEV1-%Pred-Post 64 %   FEV1-%Change-Post 5 %   FEV6-Pre 1.84 L   FEV6-%Pred-Pre 59 %   FEV6-Post 1.93 L   FEV6-%Pred-Post 62 %   FEV6-%Change-Post 4 %   Pre FEV1/FVC ratio 83 %   FEV1FVC-%Pred-Pre 104 %   Post FEV1/FVC ratio 84 %   FEV1FVC-%Change-Post 1 %   Pre  FEV6/FVC Ratio 100 %   FEV6FVC-%Pred-Pre 103 %   Post FEV6/FVC ratio 100 %   FEV6FVC-%Pred-Post 103 %   FEF 25-75 Pre 1.73 L/sec   FEF2575-%Pred-Pre 69 %   FEF 25-75 Post 2.23 L/sec   FEF2575-%Pred-Post 90 %   FEF2575-%Change-Post 29 %   RV 1.17 L   RV % pred 55 %   TLC 3.15 L   TLC % pred 55 %   DLCO unc 18.75 ml/min/mmHg   DLCO unc % pred 63 %   DL/VA 6.31 ml/min/mmHg/L   DL/VA % pred 120 %  Zinc     Status: None   Collection Time: 04/16/15 11:59 AM  Result Value Ref Range   Zinc 80 56 - 134 ug/dL    Comment:                                 Detection Limit = 5  Comprehensive metabolic panel     Status: Abnormal   Collection Time: 04/16/15 11:59 AM  Result Value Ref Range   Glucose 78 65 - 99 mg/dL   BUN 11 6 - 24 mg/dL   Creatinine, Ser 0.87 0.57 - 1.00 mg/dL   GFR calc non Af Amer 74 >59 mL/min/1.73   GFR calc Af Amer 86 >59 mL/min/1.73   BUN/Creatinine Ratio 13 9 - 23   Sodium 139 134 - 144 mmol/L   Potassium 4.8 3.5 - 5.2 mmol/L   Chloride 98 97 - 108 mmol/L   CO2 29 18 - 29 mmol/L   Calcium 9.6 8.7 - 10.2 mg/dL   Total Protein 8.0 6.0 - 8.5 g/dL   Albumin 4.0 3.5 - 5.5 g/dL   Globulin, Total 4.0 1.5 - 4.5 g/dL   Albumin/Globulin Ratio 1.0 (L) 1.1 - 2.5   Bilirubin Total 0.2 0.0 - 1.2 mg/dL   Alkaline Phosphatase 91 39 - 117 IU/L   AST 28 0 - 40 IU/L   ALT 20 0 - 32 IU/L  Vitamin B12     Status: None   Collection Time: 04/16/15 11:59 AM  Result Value Ref Range   Vitamin B-12 847 211 - 946 pg/mL  Vitamin  B1     Status: None   Collection Time: 04/16/15 11:59 AM  Result Value Ref Range   Thiamine 136.0 66.5 - 200.0 nmol/L    PHQ2/9: Depression screen PHQ 2/9 04/16/2015  Decreased Interest 0  Down, Depressed, Hopeless 1  PHQ - 2 Score 1     Fall Risk: Fall Risk  04/16/2015  Falls in the past year? Yes  Number falls in past yr: 2 or more  Injury with Fall? No      Assessment & Plan   1. Upper respiratory infection Likely viral, however the  patient has lupus and asthma and previous history of pneumonitis, and can be pneumonia - chlorpheniramine-HYDROcodone (TUSSIONEX PENNKINETIC ER) 10-8 MG/5ML SUER; Take 5 mLs by mouth 2 (two) times daily.  Dispense: 140 mL; Refill: 0  2. Asthma with exacerbation, mild intermittent  - predniSONE (DELTASONE) 10 MG tablet; Take 1 tablet (10 mg total) by mouth daily with breakfast.  Dispense: 6 tablet; Refill: 0  3. Productive cough And depending on results start antibiotics.  - DG Chest 2 View; Future

## 2015-04-28 NOTE — Telephone Encounter (Signed)
Called Pharmacy and they state due to patient Insurance Medicare Part D they will not cover any medication. But the best OTC cough medication is Delsym.

## 2015-05-01 ENCOUNTER — Telehealth: Payer: Self-pay | Admitting: Emergency Medicine

## 2015-05-01 ENCOUNTER — Other Ambulatory Visit: Payer: Self-pay | Admitting: Family Medicine

## 2015-05-01 ENCOUNTER — Telehealth: Payer: Self-pay | Admitting: Family Medicine

## 2015-05-01 MED ORDER — BENZONATATE 100 MG PO CAPS: 100.0000 mg | ORAL_CAPSULE | Freq: Three times a day (TID) | ORAL | Status: DC | PRN

## 2015-05-01 NOTE — Telephone Encounter (Signed)
Spoke to patient, did not get tessalon perles because of cost, we will send 100mg  to [facility] to see if it is cheaper, continue asthma medication

## 2015-05-01 NOTE — Telephone Encounter (Signed)
Pt states she is not getting any relief of her symptoms. Pt would like a call back.

## 2015-05-01 NOTE — Telephone Encounter (Signed)
Patient called and stated that she was no better. Prednisone or cough meds did not help. She feel that she is worse. Would like antibiotic called in to [facility]

## 2015-05-01 NOTE — Telephone Encounter (Signed)
Still having bad cough

## 2015-05-05 ENCOUNTER — Encounter: Payer: Self-pay | Admitting: Family Medicine

## 2015-05-05 ENCOUNTER — Ambulatory Visit (INDEPENDENT_AMBULATORY_CARE_PROVIDER_SITE_OTHER): Payer: Medicare Other | Admitting: Family Medicine

## 2015-05-05 VITALS — BP 118/82 | HR 101 | Temp 99.0°F | Resp 14 | Ht 68.0 in | Wt 237.3 lb

## 2015-05-05 DIAGNOSIS — R058 Other specified cough: Secondary | ICD-10-CM

## 2015-05-05 DIAGNOSIS — M797 Fibromyalgia: Secondary | ICD-10-CM | POA: Diagnosis not present

## 2015-05-05 DIAGNOSIS — R05 Cough: Secondary | ICD-10-CM | POA: Diagnosis not present

## 2015-05-05 DIAGNOSIS — E8881 Metabolic syndrome: Secondary | ICD-10-CM

## 2015-05-05 DIAGNOSIS — J302 Other seasonal allergic rhinitis: Secondary | ICD-10-CM

## 2015-05-05 DIAGNOSIS — K219 Gastro-esophageal reflux disease without esophagitis: Secondary | ICD-10-CM

## 2015-05-05 DIAGNOSIS — I10 Essential (primary) hypertension: Secondary | ICD-10-CM

## 2015-05-05 DIAGNOSIS — G4700 Insomnia, unspecified: Secondary | ICD-10-CM

## 2015-05-05 DIAGNOSIS — E785 Hyperlipidemia, unspecified: Secondary | ICD-10-CM

## 2015-05-05 DIAGNOSIS — J309 Allergic rhinitis, unspecified: Secondary | ICD-10-CM | POA: Diagnosis not present

## 2015-05-05 DIAGNOSIS — J3089 Other allergic rhinitis: Secondary | ICD-10-CM

## 2015-05-05 DIAGNOSIS — R739 Hyperglycemia, unspecified: Secondary | ICD-10-CM

## 2015-05-05 MED ORDER — OMEPRAZOLE 40 MG PO CPDR: 40.0000 mg | DELAYED_RELEASE_CAPSULE | Freq: Every day | ORAL | Status: DC

## 2015-05-05 MED ORDER — FLUTICASONE PROPIONATE 50 MCG/ACT NA SUSP
2.0000 | Freq: Every day | NASAL | Status: DC
Start: 2015-05-05 — End: 2015-11-25

## 2015-05-05 MED ORDER — AZITHROMYCIN 250 MG PO TABS: ORAL_TABLET | ORAL | Status: DC

## 2015-05-05 NOTE — Progress Notes (Signed)
Name: Nicole Ballard   MRN: [id number]    DOB: [birth year]   Date:05/05/2015       Progress Note  Subjective  Chief Complaint  Chief Complaint  Patient presents with  . Medication Refill    follow-up  . Hypertension    dizziness  . Depression  . Gastrophageal Reflux  . Asthma    HPI  HTN: she gets dizzy at times, she was given lower dose of Losartan/hctz in June 2016 down from 100/25 to 50/12.5. She still gets dizzy at times, and states she has both prescriptions at home, advised to take half of losartan/hctz 100/25 if needed, instead of alternating dose.   GERD: doing well at this time with Omeprazole, but she still has chronic nausea, seen by GI . She also has constipation, and is off Linzess and Amitiza, taking Miralax and corectal per GI  Depression: still seeing Psychiatrist, she has good days and bad days, taking medication as prescribed  FMS: she has daily pain, today is 7/10. She tries to go to the gym and it seems to help with her energy.   Productive Cough: going on for weeks, finished prednisone still having symptom, she is immunosuppressed and we will give her antibiotics today  Patient Active Problem List   Diagnosis Date Noted  . Abnormal CAT scan 04/11/2015  . Auditory impairment 04/11/2015  . Insomnia, persistent 04/11/2015  . Chronic kidney disease (CKD), stage I 04/11/2015  . Chronic nonmalignant pain 04/11/2015  . Chronic constipation 04/11/2015  . Dyslipidemia 04/11/2015  . Fibromyalgia 04/11/2015  . Gastro-esophageal reflux disease without esophagitis 04/11/2015  . Dysphonia 04/11/2015  . Absence of bladder continence 04/11/2015  . LBP (low back pain) 04/11/2015  . Eczema intertrigo 04/11/2015  . Keratosis pilaris 04/11/2015  . Chronic recurrent major depressive disorder 04/11/2015  . Dysmetabolic syndrome 53/29/9242  . Neurogenic claudication 04/11/2015  . Perennial allergic rhinitis with seasonal variation 04/11/2015  . Abnormal presence of  protein in urine 04/11/2015  . Seborrhea capitis 04/11/2015  . Moderate dysplasia of vulva 04/11/2015  . Vitamin D deficiency 04/11/2015  . Treadmill stress test negative for angina pectoris 03/05/2015  . Shortness of breath 05/20/2014  . Adult BMI 30+ 02/03/2014  . Asthma, chronic 12/31/2013  . H/O total knee replacement 12/31/2013  . Episcleritis 09/26/2013  . Vocal cord dysfunction 05/28/2013  . Anxiety, generalized 03/19/2013  . Back pain 12/18/2012  . Pulmonary nodules 11/26/2012  . Lung nodule, multiple 11/26/2012  . Abnormal lung field 11/26/2012  . Arthritis, degenerative 11/01/2012  . H/O aspiration pneumonitis 10/22/2012  . Postinflammatory pulmonary fibrosis 10/22/2012  . Mixed incontinence 05/04/2012  . Benign neoplasm of pituitary gland and craniopharyngeal duct 12/29/2010  . Clinical depression 11/05/2010  . Inflammatory autoimmune disorder 11/05/2010  . Chronic nausea 07/01/2008  . Benign hypertension 01/25/2005    Past Surgical History  Procedure Laterality Date  . Cholecystectomy    . Vaginal hysterectomy    . Brain surgery      Family History  Problem Relation Age of Onset  . Breast cancer Mother   . Cancer Mother 33    Breast  . Cancer Father 16    Stomach  . Breast cancer Maternal Aunt     Social History   Social History  . Marital Status: Divorced    Spouse Name: N/A  . Number of Children: N/A  . Years of Education: N/A   Occupational History  . Not on file.   Social History Main Topics  .  Smoking status: Never Smoker   . Smokeless tobacco: Never Used  . Alcohol Use: No  . Drug Use: No  . Sexual Activity: Not Currently   Other Topics Concern  . Not on file   Social History Narrative     Current outpatient prescriptions:  .  albuterol (PROAIR HFA) 108 (90 BASE) MCG/ACT inhaler, Inhale 2 puffs into the lungs every 6 (six) hours as needed for wheezing. DX code R06.02, Disp: 3 Inhaler, Rfl: 3 .  azithromycin (ZITHROMAX Z-PAK) 250  MG tablet, Take as directed 2 first day and one daily after that, Disp: 6 each, Rfl: 0 .  Cholecalciferol (VITAMIN D) 2000 UNITS CAPS, Take 1 capsule by mouth daily., Disp: , Rfl:  .  clonazePAM (KLONOPIN) 0.5 MG tablet, , Disp: , Rfl: 2 .  diclofenac sodium (VOLTAREN) 1 % GEL, APP 2 GRAMS EXT AA QID, Disp: , Rfl: 6 .  docusate sodium (COLACE) 100 MG capsule, Take 2 capsules (200 mg total) by mouth 2 (two) times daily., Disp: 120 capsule, Rfl: 0 .  DULoxetine (CYMBALTA) 60 MG capsule, Take 120 mg by mouth at bedtime. , Disp: , Rfl:  .  fluticasone (FLONASE) 50 MCG/ACT nasal spray, Place 2 sprays into both nostrils daily., Disp: 48 g, Rfl: 1 .  gabapentin (NEURONTIN) 300 MG capsule, Take 300 mg by mouth., Disp: , Rfl:  .  Glycerin, Laxative, (RA GLYCERIN ADULT) 80.7 % SUPP, Place rectally., Disp: , Rfl:  .  hydroxychloroquine (PLAQUENIL) 200 MG tablet, Take 200 mg by mouth 2 (two) times daily., Disp: , Rfl:  .  lamoTRIgine (LAMICTAL) 100 MG tablet, Take 1 tablet by mouth 3 (three) times daily., Disp: , Rfl:  .  losartan-hydrochlorothiazide (HYZAAR) 100-25 MG per tablet, Take 0.5 tablets by mouth daily. , Disp: , Rfl:  .  mometasone-formoterol (DULERA) 200-5 MCG/ACT AERO, Inhale 2 puffs into the lungs 2 (two) times daily., Disp: 13 g, Rfl: 2 .  Multiple Vitamin (MULTIVITAMIN) tablet, Take 1 tablet by mouth daily., Disp: , Rfl:  .  omeprazole (PRILOSEC) 40 MG capsule, Take 1 capsule (40 mg total) by mouth daily., Disp: 90 capsule, Rfl: 3 .  polyethylene glycol powder (GLYCOLAX/MIRALAX) powder, 2 cap fulls in a full glass of water, three times a day, for 5 days., Disp: 255 g, Rfl: 0 .  traZODone (DESYREL) 150 MG tablet, Take 150 mg by mouth at bedtime., Disp: , Rfl:   Allergies  Allergen Reactions  . Ace Inhibitors     Other reaction(s): OTHER Pt states she can not take ace inhibitors. Pt states she can not remember her reaction.   Marland Kitchen Penicillins      ROS  Constitutional: Negative for fever  or significant weight change.  Respiratory: Productive for cough but no shortness of breath.   Cardiovascular: Negative for chest pain , occasional palpitations.  Gastrointestinal: Negative for abdominal pain, no bowel changes.  Musculoskeletal: Negative for gait problem or joint swelling.  Skin: Negative for rash.  Neurological: Negative for dizziness or headache.  No other specific complaints in a complete review of systems (except as listed in HPI above).  Objective  Filed Vitals:   05/05/15 0959  BP: 118/82  Pulse: 101  Temp: 99 F (37.2 C)  TempSrc: Oral  Resp: 14  Height: 5\' 8"  (1.727 m)  Weight: 237 lb 4.8 oz (107.639 kg)  SpO2: 96%    Body mass index is 36.09 kg/(m^2).  Physical Exam  Constitutional: Patient appears well-developed and well-nourished. Obese No distress.  HEENT: head atraumatic, normocephalic, pupils equal and reactive to light, neck supple, throat within normal limits Cardiovascular: Normal rate, regular rhythm and normal heart sounds.  No murmur heard. No BLE edema. Pulmonary/Chest: Effort normal and breath sounds normal. No respiratory distress. Abdominal: Soft.  There is no tenderness. Psychiatric: Patient has a normal mood and affect. behavior is normal. Judgment and thought content normal. Muscular Skeletal: positive trigger points, scar from right knee surgery present Skin: hypertrichosis face  Recent Results (from the past 2160 hour(s))  Pulmonary function test     Status: None   Collection Time: 03/16/15  9:26 AM  Result Value Ref Range   FVC-Pre 1.84 L   FVC-%Pred-Pre 57 %   FVC-Post 1.93 L   FVC-%Pred-Post 60 %   FVC-%Change-Post 4 %   FEV1-Pre 1.53 L   FEV1-%Pred-Pre 60 %   FEV1-Post 1.63 L   FEV1-%Pred-Post 64 %   FEV1-%Change-Post 5 %   FEV6-Pre 1.84 L   FEV6-%Pred-Pre 59 %   FEV6-Post 1.93 L   FEV6-%Pred-Post 62 %   FEV6-%Change-Post 4 %   Pre FEV1/FVC ratio 83 %   FEV1FVC-%Pred-Pre 104 %   Post FEV1/FVC ratio 84 %    FEV1FVC-%Change-Post 1 %   Pre FEV6/FVC Ratio 100 %   FEV6FVC-%Pred-Pre 103 %   Post FEV6/FVC ratio 100 %   FEV6FVC-%Pred-Post 103 %   FEF 25-75 Pre 1.73 L/sec   FEF2575-%Pred-Pre 69 %   FEF 25-75 Post 2.23 L/sec   FEF2575-%Pred-Post 90 %   FEF2575-%Change-Post 29 %   RV 1.17 L   RV % pred 55 %   TLC 3.15 L   TLC % pred 55 %   DLCO unc 18.75 ml/min/mmHg   DLCO unc % pred 63 %   DL/VA 6.31 ml/min/mmHg/L   DL/VA % pred 120 %  Zinc     Status: None   Collection Time: 04/16/15 11:59 AM  Result Value Ref Range   Zinc 80 56 - 134 ug/dL    Comment:                                 Detection Limit = 5  Comprehensive metabolic panel     Status: Abnormal   Collection Time: 04/16/15 11:59 AM  Result Value Ref Range   Glucose 78 65 - 99 mg/dL   BUN 11 6 - 24 mg/dL   Creatinine, Ser 0.87 0.57 - 1.00 mg/dL   GFR calc non Af Amer 74 >59 mL/min/1.73   GFR calc Af Amer 86 >59 mL/min/1.73   BUN/Creatinine Ratio 13 9 - 23   Sodium 139 134 - 144 mmol/L   Potassium 4.8 3.5 - 5.2 mmol/L   Chloride 98 97 - 108 mmol/L   CO2 29 18 - 29 mmol/L   Calcium 9.6 8.7 - 10.2 mg/dL   Total Protein 8.0 6.0 - 8.5 g/dL   Albumin 4.0 3.5 - 5.5 g/dL   Globulin, Total 4.0 1.5 - 4.5 g/dL   Albumin/Globulin Ratio 1.0 (L) 1.1 - 2.5   Bilirubin Total 0.2 0.0 - 1.2 mg/dL   Alkaline Phosphatase 91 39 - 117 IU/L   AST 28 0 - 40 IU/L   ALT 20 0 - 32 IU/L  Vitamin B12     Status: None   Collection Time: 04/16/15 11:59 AM  Result Value Ref Range   Vitamin B-12 847 211 - 946 pg/mL  Vitamin B1  Status: None   Collection Time: 04/16/15 11:59 AM  Result Value Ref Range   Thiamine 136.0 66.5 - 200.0 nmol/L     PHQ2/9: Depression screen PHQ 2/9 04/16/2015  Decreased Interest 0  Down, Depressed, Hopeless 1  PHQ - 2 Score 1     Fall Risk: Fall Risk  04/16/2015  Falls in the past year? Yes  Number falls in past yr: 2 or more  Injury with Fall? No    Assessment & Plan  1. Benign hypertension Continue  medication   2. Gastro-esophageal reflux disease without esophagitis  - omeprazole (PRILOSEC) 40 MG capsule; Take 1 capsule (40 mg total) by mouth daily.  Dispense: 90 capsule; Refill: 3  3. Insomnia, persistent Continue medication /Trazodone  4. Fibromyalgia Continue Cymbalta, and physical activity   5. Dysmetabolic syndrome Recheck labs  6. Productive cough  - azithromycin (ZITHROMAX Z-PAK) 250 MG tablet; Take as directed 2 first day and one daily after that  Dispense: 6 each; Refill: 0  7. Perennial allergic rhinitis with seasonal variation  - fluticasone (FLONASE) 50 MCG/ACT nasal spray; Place 2 sprays into both nostrils daily.  Dispense: 48 g; Refill: 1  8. Hyperglycemia  - Hemoglobin A1c  9. Dyslipidemia  - Lipid panel

## 2015-05-06 DIAGNOSIS — E785 Hyperlipidemia, unspecified: Secondary | ICD-10-CM | POA: Diagnosis not present

## 2015-05-06 DIAGNOSIS — R739 Hyperglycemia, unspecified: Secondary | ICD-10-CM | POA: Diagnosis not present

## 2015-05-07 LAB — LIPID PANEL
CHOLESTEROL TOTAL: 178 mg/dL (ref 100–199)
Chol/HDL Ratio: 3.2 ratio units (ref 0.0–4.4)
HDL: 56 mg/dL (ref >39)
LDL Calculated: 110 mg/dL — ABNORMAL HIGH (ref 0–99)
Triglycerides: 62 mg/dL (ref 0–149)
VLDL CHOLESTEROL CAL: 12 mg/dL (ref 5–40)

## 2015-05-07 LAB — HEMOGLOBIN A1C
ESTIMATED AVERAGE GLUCOSE: 126 mg/dL
Hgb A1c MFr Bld: 6 % — ABNORMAL HIGH (ref 4.8–5.6)

## 2015-05-08 NOTE — Progress Notes (Signed)
Patient notified

## 2015-05-12 DIAGNOSIS — R700 Elevated erythrocyte sedimentation rate: Secondary | ICD-10-CM | POA: Diagnosis not present

## 2015-05-12 DIAGNOSIS — M25531 Pain in right wrist: Secondary | ICD-10-CM | POA: Diagnosis not present

## 2015-05-12 DIAGNOSIS — G629 Polyneuropathy, unspecified: Secondary | ICD-10-CM | POA: Diagnosis not present

## 2015-05-12 DIAGNOSIS — M25532 Pain in left wrist: Secondary | ICD-10-CM | POA: Diagnosis not present

## 2015-05-12 DIAGNOSIS — M329 Systemic lupus erythematosus, unspecified: Secondary | ICD-10-CM | POA: Diagnosis not present

## 2015-05-12 DIAGNOSIS — L930 Discoid lupus erythematosus: Secondary | ICD-10-CM | POA: Diagnosis not present

## 2015-05-12 DIAGNOSIS — M797 Fibromyalgia: Secondary | ICD-10-CM | POA: Diagnosis not present

## 2015-05-12 DIAGNOSIS — Z930 Tracheostomy status: Secondary | ICD-10-CM | POA: Diagnosis not present

## 2015-05-12 DIAGNOSIS — Z01818 Encounter for other preprocedural examination: Secondary | ICD-10-CM | POA: Diagnosis not present

## 2015-05-14 DIAGNOSIS — Z780 Asymptomatic menopausal state: Secondary | ICD-10-CM | POA: Diagnosis not present

## 2015-05-14 DIAGNOSIS — M329 Systemic lupus erythematosus, unspecified: Secondary | ICD-10-CM | POA: Diagnosis not present

## 2015-05-14 DIAGNOSIS — Z90710 Acquired absence of both cervix and uterus: Secondary | ICD-10-CM | POA: Diagnosis not present

## 2015-05-14 DIAGNOSIS — E2839 Other primary ovarian failure: Secondary | ICD-10-CM | POA: Diagnosis not present

## 2015-05-14 DIAGNOSIS — Z13820 Encounter for screening for osteoporosis: Secondary | ICD-10-CM | POA: Diagnosis not present

## 2015-05-18 ENCOUNTER — Telehealth: Payer: Self-pay | Admitting: Family Medicine

## 2015-05-18 NOTE — Telephone Encounter (Signed)
Pt states she goes here in [place] [facility]

## 2015-05-18 NOTE — Telephone Encounter (Signed)
She needs to go back to pulmonologist. I think she has seen on in [place]. Please verify and if not I will make referral. Thank you

## 2015-05-18 NOTE — Telephone Encounter (Signed)
Pt said that her cough and congestion is not any better and this has been going on for several months for this started in July. She has taken steriods and her antibotics cough medicine you have given her and she even has been taking Delsymn. Needs to know how what does she need to do. She is getting very concerned.

## 2015-05-19 ENCOUNTER — Telehealth: Payer: Self-pay | Admitting: Internal Medicine

## 2015-05-19 NOTE — Telephone Encounter (Signed)
RX for her albuterol was sent in 02/24/15 #3 x 3 refills Called the pharmacy and pt still does have refills on file. Called made pt aware. Nothing further needed

## 2015-05-22 ENCOUNTER — Encounter: Payer: Self-pay | Admitting: Internal Medicine

## 2015-05-22 ENCOUNTER — Other Ambulatory Visit
Admission: RE | Admit: 2015-05-22 | Discharge: 2015-05-22 | Disposition: A | Discharge status: Home / Self Care | Payer: Medicare Other | Source: Ambulatory Visit | Attending: Internal Medicine | Admitting: Internal Medicine

## 2015-05-22 ENCOUNTER — Ambulatory Visit (INDEPENDENT_AMBULATORY_CARE_PROVIDER_SITE_OTHER): Payer: Medicare Other | Admitting: Internal Medicine

## 2015-05-22 VITALS — BP 142/82 | HR 74 | Ht 68.0 in | Wt 235.0 lb

## 2015-05-22 DIAGNOSIS — J4550 Severe persistent asthma, uncomplicated: Secondary | ICD-10-CM

## 2015-05-22 DIAGNOSIS — R911 Solitary pulmonary nodule: Secondary | ICD-10-CM

## 2015-05-22 DIAGNOSIS — M797 Fibromyalgia: Secondary | ICD-10-CM

## 2015-05-22 DIAGNOSIS — E668 Other obesity: Secondary | ICD-10-CM

## 2015-05-22 DIAGNOSIS — J45909 Unspecified asthma, uncomplicated: Secondary | ICD-10-CM | POA: Diagnosis not present

## 2015-05-22 DIAGNOSIS — Z8709 Personal history of other diseases of the respiratory system: Secondary | ICD-10-CM

## 2015-05-22 DIAGNOSIS — R490 Dysphonia: Secondary | ICD-10-CM

## 2015-05-22 DIAGNOSIS — R918 Other nonspecific abnormal finding of lung field: Secondary | ICD-10-CM

## 2015-05-22 DIAGNOSIS — R938 Abnormal findings on diagnostic imaging of other specified body structures: Secondary | ICD-10-CM

## 2015-05-22 DIAGNOSIS — IMO0002 Reserved for concepts with insufficient information to code with codable children: Secondary | ICD-10-CM

## 2015-05-22 DIAGNOSIS — K219 Gastro-esophageal reflux disease without esophagitis: Secondary | ICD-10-CM

## 2015-05-22 DIAGNOSIS — R9389 Abnormal findings on diagnostic imaging of other specified body structures: Secondary | ICD-10-CM

## 2015-05-22 DIAGNOSIS — F411 Generalized anxiety disorder: Secondary | ICD-10-CM

## 2015-05-22 NOTE — Progress Notes (Signed)
* [facility]     Assessment and Plan:  Chronic asthma -Continue Dulera. -We will check an IgE Rast testing. -After Rast testing, we'll start the patient on Claritin-D once daily, I've also asked her to switch her Flonase 2 at night and take Mucinex DM twice daily to better control her expectoration and bronchitis symptoms. Could also consider adding an inhaled LAMA.  Dyspnea. -I suspect that this is multifactorial from sinus drainage, restrictive lung disease with obesity, elevated diaphragms, particularly on the left side, GERD with possible aspiration, obesity, possible asthma and vocal cord dysfunction.  Vocal cord dysfunction. -The patient has been going for vocal cord therapy training.  Chronic hoarseness.  -Possibly secondary to chronic sinus drainage and/or reflux.  Lung nodules -Previously seen in CT chest from December 2015, and first seen in March 2014. There was no evidence of interstitial lung disease noted on CT scans. -We'll repeat CT chest in December of this year, which should complete surveillance. Her nodules appear to be low risk for cancer this time and may be related to reflux pneumonitis. We'll have the patient follow-up after her CT of the chest is completed.  Restrictive lung disease -Likely due to body habitus.  Obesity  Chronic Rhinitis -Change flonase to every night. Start Claritin-D after RAST testing.    Date: 05/22/2015  MRN# [id number] Nicole Ballard 07/12/1956   Nicole Ballard is a 59 y.o. old female seen in follow up for chief complaint of  No chief complaint on file.    HPI:  Nicole Ballard first saw the [facility] office in February 2014 for evaluation of shortness of breath and wheezing. She had been hospitalized at [facility] in January 2014 for "possible pneumonia" and had a CXR with intersitial opacities and bilateral hilar adenopathy. She was diagnosed with Lupus in 2005 by [facility] and was treated  with Plaquenil. She has also been diagnosed with vocal cord dysfunction by [facility] and in Feb 2014 was undergoing behavioral therapy. 11/2012 PFT's at [facility] showed moderate to severe restriction with a depressed ERV and DLCO. 11/2012 CT chest just showed resolving post infectious changes and no clear lymphadenopathy or interstitial lung disease. Vocal cord dysfunction behavioral therapy at [facility] has been helpful.  It  been thought that her dyspnea is secondary to deconditioning, obesity, and vocal cord dysfunction. Her CT of chest has shown groundglass opacity suggestive of aspiration in the past. She appeared to have symptomatic benefit with albuterol, at previous visit she was started on Dulera. Review of her CT chest from December 2015 shows elevated diaphragms, particularly in the left side. Since her last visit she notes that her breathing is ok with walking, she takes walks daily. She does note that sometimes she has dyspnea at rest such as watching television and taking a shower. She has been having chronic cough and expectoration and hoarseness for the past 2 months.  She is using dulera 2 puffs bid and feels that it is helping.  She is taking omeprazole for reflux.  She has never been tested for allergies.  She is using flonase 2 puffs in each nostril every morning.  Not working, no occupational exposures.  No pets at home.   Test results : January 2014 CXR > increased interstitial markings bilaterally, mediastinal lymphadenopathy 11/20/2012 Full PFT > ratio 77%, FEV1 1.57 L (57% predicted) total lung capacity 3.52 L (61% predicted), ERV 0.29 L (24% predicted), DLCO 16.6 (54% predicted) 11/23/12 CT chest >> no mediastinal  lymphadenopathy; some increased interstitial markings and scattered ggo in R lung favored to be post infectious; few scattered pulmonary nodules, the largest of which is 93mm; some air trapping in bases 11/20/2012 Full PFT at [facility] > Ratio 77%, FEV1 1.57 (57%) FEF 25-75 1.36  (45%); TLC 3.52 L (61% pred), ERV 0.29 (24% pred), DLCO 16.6 (54% pred);  02/2014 PFT [facility] > Ratio 83%, FEV1 1.67L (61% pred), TLC 2.81 L (49% pred), ERV 0.36L (30% pred), DLCO 16.9 (61% pred) 03/2014 Nuclear stress test> normal 08/2014 CT chest > GGO RLL predominantly, RML nodule smaller; radiology favors chronic aspiration, less likely NSIP or COP, 36mm nodules LLL (personally reviewed), the diaphragms are elevated bilaterally, particularly on the left side. Chest x-ray from 04/28/2015 reviewed, unremarkable.  TLC  Date Value Ref Range Status  03/16/2015 3.15 L Final     Medication:   Current Outpatient Rx  Name  Route  Sig  Dispense  Refill  . albuterol (PROAIR HFA) 108 (90 BASE) MCG/ACT inhaler   Inhalation   Inhale 2 puffs into the lungs every 6 (six) hours as needed for wheezing. DX code R06.02   3 Inhaler   3   . azithromycin (ZITHROMAX Z-PAK) 250 MG tablet      Take as directed 2 first day and one daily after that   6 each   0   . Cholecalciferol (VITAMIN D) 2000 UNITS CAPS   Oral   Take 1 capsule by mouth daily.         . clonazePAM (KLONOPIN) 0.5 MG tablet            2   . diclofenac sodium (VOLTAREN) 1 % GEL      APP 2 GRAMS EXT AA QID      6   . docusate sodium (COLACE) 100 MG capsule   Oral   Take 2 capsules (200 mg total) by mouth 2 (two) times daily.   120 capsule   0   . DULoxetine (CYMBALTA) 60 MG capsule   Oral   Take 120 mg by mouth at bedtime.          . fluticasone (FLONASE) 50 MCG/ACT nasal spray   Each Nare   Place 2 sprays into both nostrils daily.   48 g   1   . gabapentin (NEURONTIN) 300 MG capsule   Oral   Take 300 mg by mouth.         . Glycerin, Laxative, (RA GLYCERIN ADULT) 80.7 % SUPP   Rectal   Place rectally.         . hydroxychloroquine (PLAQUENIL) 200 MG tablet   Oral   Take 200 mg by mouth 2 (two) times daily.         Marland Kitchen lamoTRIgine (LAMICTAL) 100 MG tablet   Oral   Take 1 tablet by mouth 3 (three)  times daily.         Marland Kitchen losartan-hydrochlorothiazide (HYZAAR) 100-25 MG per tablet   Oral   Take 0.5 tablets by mouth daily.          . mometasone-formoterol (DULERA) 200-5 MCG/ACT AERO   Inhalation   Inhale 2 puffs into the lungs 2 (two) times daily.   13 g   2   . Multiple Vitamin (MULTIVITAMIN) tablet   Oral   Take 1 tablet by mouth daily.         Marland Kitchen omeprazole (PRILOSEC) 40 MG capsule   Oral   Take 1 capsule (40 mg total) by  mouth daily.   90 capsule   3   . polyethylene glycol powder (GLYCOLAX/MIRALAX) powder      2 cap fulls in a full glass of water, three times a day, for 5 days.   255 g   0   . traZODone (DESYREL) 150 MG tablet   Oral   Take 150 mg by mouth at bedtime.            Allergies:  Ace inhibitors and Penicillins  Review of Systems: Gen:  Denies  fever, sweats. HEENT: Denies blurred vision. Cvc:  No dizziness, chest pain or heaviness Resp:   Denies cough or sputum porduction. Gi: Denies swallowing difficulty, stomach pain. constipation, bowel incontinence Gu:  Denies bladder incontinence, burning urine Ext:   No Joint pain, stiffness. Skin: No skin rash, easy bruising. Endoc:  No polyuria, polydipsia. Psych: No depression, insomnia. Other:  All other systems were reviewed and found to be negative other than what is mentioned in the HPI.   Physical Examination:   VS: There were no vitals taken for this visit.  General Appearance: No distress  Neuro:without focal findings,  speech normal,  HEENT: PERRLA, EOM intact. Pulmonary: normal breath sounds, No wheezing.   CardiovascularNormal S1,S2.  No m/r/g.   Abdomen: Benign, Soft, non-tender. Renal:  No costovertebral tenderness  GU:  Not performed at this time. Endoc: No evident thyromegaly, no signs of acromegaly. Skin:   warm, no rash. Extremities: normal, no cyanosis, clubbing.   LABORATORY PANEL:   CBC No results for input(s): WBC, HGB, HCT, PLT in the last 168  hours. ------------------------------------------------------------------------------------------------------------------  Chemistries  No results for input(s): NA, K, CL, CO2, GLUCOSE, BUN, CREATININE, CALCIUM, MG, AST, ALT, ALKPHOS, BILITOT in the last 168 hours.  Invalid input(s): GFRCGP ------------------------------------------------------------------------------------------------------------------  Cardiac Enzymes No results for input(s): TROPONINI in the last 168 hours. ------------------------------------------------------------  RADIOLOGY:   No results found for this or any previous visit. Results for orders placed during the hospital encounter of 04/28/15  DG Chest 2 View   Narrative CLINICAL DATA:  Productive cough for 4 days.  EXAM: CHEST  2 VIEW  COMPARISON:  05/20/2014  FINDINGS: The heart size and mediastinal contours are within normal limits. Both lungs are clear. The visualized skeletal structures are unremarkable.  IMPRESSION: No active cardiopulmonary disease.   Electronically Signed   By: Rolm Baptise M.D.   On: 04/28/2015 13:19    ------------------------------------------------------------------------------------------------------------------  Thank  you for allowing [facility] to assist in the care of your patient. Our recommendations are noted above.  Please contact us if we can be of further service.   Marda Stalker, MD.  [facility] Office Number: [phone]  Patricia Pesa, M.D.  Vilinda Boehringer, M.D.  Merton Border, M.D

## 2015-05-22 NOTE — Patient Instructions (Addendum)
--  should not eat 4 hours before bedtime.  --Take flonase 2 sprays in each nostril at night.  --Will check Ige and RAST region 2 test.  --Start Claritin-D once daily AFTER THE BLOOD TEST. --Ct chest in about 3 months and follow up.  --Refer to pulmonary rehab.  --Start Mucinex-DM cough syrup twice daily.

## 2015-05-23 LAB — IGE: IgE (Immunoglobulin E), Serum: 12 IU/mL (ref 0–100)

## 2015-05-24 LAB — MISC LABCORP TEST (SEND OUT): LabCorp test name: 676528

## 2015-05-25 DIAGNOSIS — Z471 Aftercare following joint replacement surgery: Secondary | ICD-10-CM | POA: Diagnosis not present

## 2015-05-25 DIAGNOSIS — Z96651 Presence of right artificial knee joint: Secondary | ICD-10-CM | POA: Diagnosis not present

## 2015-05-27 ENCOUNTER — Telehealth: Payer: Self-pay | Admitting: Internal Medicine

## 2015-05-27 DIAGNOSIS — J45909 Unspecified asthma, uncomplicated: Secondary | ICD-10-CM

## 2015-05-27 NOTE — Telephone Encounter (Signed)
The RAST and IgE was normal. Please have the patient do a CBC with differential. Thanks.

## 2015-05-28 NOTE — Telephone Encounter (Signed)
Pt informed of results and will go back to lab to have CBC w/diff drawn. Order placed. Nothing further needed.

## 2015-05-29 ENCOUNTER — Other Ambulatory Visit
Admission: RE | Admit: 2015-05-29 | Discharge: 2015-05-29 | Disposition: A | Discharge status: Home / Self Care | Payer: Medicare Other | Source: Ambulatory Visit | Attending: Internal Medicine | Admitting: Internal Medicine

## 2015-05-29 DIAGNOSIS — J45909 Unspecified asthma, uncomplicated: Secondary | ICD-10-CM | POA: Diagnosis not present

## 2015-05-29 LAB — CBC WITH DIFFERENTIAL/PLATELET
BASOS ABS: 0.2 10*3/uL — AB (ref 0–0.1)
BASOS PCT: 5 %
EOS ABS: 0.1 10*3/uL (ref 0–0.7)
EOS PCT: 2 %
HCT: 41.1 % (ref 35.0–47.0)
Hemoglobin: 13.4 g/dL (ref 12.0–16.0)
LYMPHS PCT: 35 %
Lymphs Abs: 1.5 10*3/uL (ref 1.0–3.6)
MCH: 27.2 pg (ref 26.0–34.0)
MCHC: 32.6 g/dL (ref 32.0–36.0)
MCV: 83.2 fL (ref 80.0–100.0)
MONO ABS: 0.5 10*3/uL (ref 0.2–0.9)
Monocytes Relative: 11 %
Neutro Abs: 2 10*3/uL (ref 1.4–6.5)
Neutrophils Relative %: 47 %
PLATELETS: 327 10*3/uL (ref 150–440)
RBC: 4.94 MIL/uL (ref 3.80–5.20)
RDW: 14 % (ref 11.5–14.5)
WBC: 4.3 10*3/uL (ref 3.6–11.0)

## 2015-06-01 ENCOUNTER — Other Ambulatory Visit: Payer: Self-pay

## 2015-06-01 MED ORDER — MOMETASONE FURO-FORMOTEROL FUM 200-5 MCG/ACT IN AERO
2.0000 | INHALATION_SPRAY | Freq: Two times a day (BID) | RESPIRATORY_TRACT | Status: DC
Start: 2015-06-01 — End: 2015-08-06

## 2015-06-02 ENCOUNTER — Telehealth: Payer: Self-pay | Admitting: Family Medicine

## 2015-06-02 DIAGNOSIS — K219 Gastro-esophageal reflux disease without esophagitis: Secondary | ICD-10-CM

## 2015-06-02 MED ORDER — OMEPRAZOLE 40 MG PO CPDR: 40.0000 mg | DELAYED_RELEASE_CAPSULE | Freq: Every day | ORAL | Status: DC

## 2015-06-02 NOTE — Telephone Encounter (Signed)
Pt would like a call back about her medication.  °

## 2015-06-02 NOTE — Telephone Encounter (Signed)
Patient had a question about her Omeprazole 40 mg 1 tablet two times daily since 2011, on her last visit Dr. Ancil Boozer changed her prescription to one tablet once daily. The patient stated they never talked about lowering her prescription, could you check and see why her medication dosage therapy was changed?

## 2015-06-03 ENCOUNTER — Telehealth: Payer: Self-pay | Admitting: Family Medicine

## 2015-06-03 ENCOUNTER — Telehealth: Payer: Self-pay

## 2015-06-03 DIAGNOSIS — R05 Cough: Secondary | ICD-10-CM

## 2015-06-03 DIAGNOSIS — R059 Cough, unspecified: Secondary | ICD-10-CM

## 2015-06-03 NOTE — Telephone Encounter (Signed)
Spoke with pt and she states she is having worse SOB and cough is no better. Pt given appt with DS for 06/04/15. Nothing further needed.

## 2015-06-03 NOTE — Telephone Encounter (Signed)
Patient would like a referral to a pulmonologist in [facility] for a second opinion. States she has been coughing and short of breath since July. She is currently using her inhaler daily and the doctors here is not giving her any answers.

## 2015-06-03 NOTE — Telephone Encounter (Signed)
Pt called, states she is still coughing and has SOB without exertion. Her PCP advised her to call her pulmonologist. Please call.

## 2015-06-03 NOTE — Telephone Encounter (Signed)
Referral sent, please notify patient that someone will call her with an appointment

## 2015-06-04 ENCOUNTER — Ambulatory Visit (INDEPENDENT_AMBULATORY_CARE_PROVIDER_SITE_OTHER): Payer: Medicare Other | Admitting: Pulmonary Disease

## 2015-06-04 ENCOUNTER — Encounter: Payer: Self-pay | Admitting: Pulmonary Disease

## 2015-06-04 VITALS — BP 130/74 | HR 93 | Ht 68.0 in | Wt 236.0 lb

## 2015-06-04 DIAGNOSIS — J45901 Unspecified asthma with (acute) exacerbation: Secondary | ICD-10-CM | POA: Diagnosis not present

## 2015-06-04 MED ORDER — PREDNISONE 10 MG PO TABS: ORAL_TABLET | ORAL | Status: DC

## 2015-06-04 NOTE — Progress Notes (Signed)
Active problems: Asthma VCD SLE Lung nodules  Subj: Acute care visit for increased cough, dyspnea, hoarseness. Had similar episode in July treated with abx, steroids, antitussives with improvement. Presently, cough is NP and largely throat clearing  Obj: Filed Vitals:   06/04/15 1143  BP: 130/74  Pulse: 93  Height: 5\' 8"  (1.727 m)  Weight: 236 lb (107.049 kg)  SpO2: 100%   Gen: NAD, hoarse voice quality HEENT: all WNL Neck: no LAN, no JVD, no stridor Chest: slightly coarse BS, no true wheezes Cardiac: reg, no M XBL:TJQZ, NT, +BS Ext: no C/C/E  BMET    Component Value Date/Time   NA 139 04/16/2015 1159   NA 138 01/27/2015 1840   NA 135* 12/10/2013 0400   K 4.8 04/16/2015 1159   K 3.7 12/10/2013 0400   CL 98 04/16/2015 1159   CL 100 12/10/2013 0400   CO2 29 04/16/2015 1159   CO2 30 12/10/2013 0400   GLUCOSE 78 04/16/2015 1159   GLUCOSE 108* 01/27/2015 1840   GLUCOSE 120* 12/10/2013 0400   BUN 11 04/16/2015 1159   BUN 15 01/27/2015 1840   BUN 10 12/10/2013 0400   CREATININE 0.87 04/16/2015 1159   CREATININE 0.82 12/10/2013 0400   CALCIUM 9.6 04/16/2015 1159   CALCIUM 8.9 12/10/2013 0400   GFRNONAA 74 04/16/2015 1159   GFRNONAA >60 12/10/2013 0400   GFRAA 86 04/16/2015 1159   GFRAA >60 12/10/2013 0400    CBC    Component Value Date/Time   WBC 4.3 05/29/2015 0912   WBC 7.7 12/10/2013 0400   RBC 4.94 05/29/2015 0912   RBC 5.02 12/10/2013 0400   HGB 13.4 05/29/2015 0912   HGB 14.3 12/10/2013 0400   HCT 41.1 05/29/2015 0912   HCT 42.4 12/10/2013 0400   PLT 327 05/29/2015 0912   PLT 326 12/10/2013 0400   MCV 83.2 05/29/2015 0912   MCV 84 12/10/2013 0400   MCH 27.2 05/29/2015 0912   MCH 28.6 12/10/2013 0400   MCHC 32.6 05/29/2015 0912   MCHC 33.8 12/10/2013 0400   RDW 14.0 05/29/2015 0912   RDW 14.1 12/10/2013 0400   LYMPHSABS 1.5 05/29/2015 0912   LYMPHSABS 2.2 08/11/2013 2139   MONOABS 0.5 05/29/2015 0912   MONOABS 0.7 08/11/2013 2139   EOSABS  0.1 05/29/2015 0912   EOSABS 0.2 08/11/2013 2139   BASOSABS 0.2* 05/29/2015 0912   BASOSABS 0.1 08/11/2013 2139    CXR: no new film   IMPRESSION: 1) Asthma 2) VCD 3) GERD - on PPI BID and sleeps with HOB elevation 4) H/O SLE 5) acute increase in dyspnea and cough c/w asthma exacerbation  PLAN/RECS:  Cont Dulera Prednisone 40 mg daily X 5 Encouraged more liberal use of rescue MDI Follow up in 2-4 wks  Merton Border, MD PCCM service Mobile [phone] Pager [phone]

## 2015-06-04 NOTE — Patient Instructions (Signed)
Prednisone 40 mg daily for 5 days Use albuterol inhaler more liberally. You may take one puff at a time and wait 5 minutes to decide on the other puff Follow up in our office in 2-4 weeks

## 2015-06-04 NOTE — Telephone Encounter (Signed)
Patient notified of new referral to [facility].

## 2015-06-08 DIAGNOSIS — Z79899 Other long term (current) drug therapy: Secondary | ICD-10-CM | POA: Diagnosis not present

## 2015-06-08 DIAGNOSIS — F329 Major depressive disorder, single episode, unspecified: Secondary | ICD-10-CM | POA: Diagnosis not present

## 2015-06-08 DIAGNOSIS — F410 Panic disorder [episodic paroxysmal anxiety] without agoraphobia: Secondary | ICD-10-CM | POA: Diagnosis not present

## 2015-06-08 DIAGNOSIS — J929 Pleural plaque without asbestos: Secondary | ICD-10-CM | POA: Diagnosis not present

## 2015-06-08 DIAGNOSIS — I129 Hypertensive chronic kidney disease with stage 1 through stage 4 chronic kidney disease, or unspecified chronic kidney disease: Secondary | ICD-10-CM | POA: Diagnosis not present

## 2015-06-08 DIAGNOSIS — M797 Fibromyalgia: Secondary | ICD-10-CM | POA: Diagnosis not present

## 2015-06-08 DIAGNOSIS — K219 Gastro-esophageal reflux disease without esophagitis: Secondary | ICD-10-CM | POA: Diagnosis not present

## 2015-06-08 DIAGNOSIS — R05 Cough: Secondary | ICD-10-CM | POA: Diagnosis not present

## 2015-06-08 DIAGNOSIS — J45909 Unspecified asthma, uncomplicated: Secondary | ICD-10-CM | POA: Diagnosis not present

## 2015-06-08 DIAGNOSIS — G894 Chronic pain syndrome: Secondary | ICD-10-CM | POA: Diagnosis not present

## 2015-06-08 DIAGNOSIS — N181 Chronic kidney disease, stage 1: Secondary | ICD-10-CM | POA: Diagnosis not present

## 2015-06-08 DIAGNOSIS — H905 Unspecified sensorineural hearing loss: Secondary | ICD-10-CM | POA: Diagnosis not present

## 2015-06-08 DIAGNOSIS — M1990 Unspecified osteoarthritis, unspecified site: Secondary | ICD-10-CM | POA: Diagnosis not present

## 2015-06-09 DIAGNOSIS — H2513 Age-related nuclear cataract, bilateral: Secondary | ICD-10-CM | POA: Diagnosis not present

## 2015-06-09 DIAGNOSIS — Z79899 Other long term (current) drug therapy: Secondary | ICD-10-CM | POA: Diagnosis not present

## 2015-06-09 DIAGNOSIS — H43813 Vitreous degeneration, bilateral: Secondary | ICD-10-CM | POA: Diagnosis not present

## 2015-06-09 DIAGNOSIS — M329 Systemic lupus erythematosus, unspecified: Secondary | ICD-10-CM | POA: Diagnosis not present

## 2015-06-11 DIAGNOSIS — M545 Low back pain: Secondary | ICD-10-CM | POA: Diagnosis not present

## 2015-06-11 DIAGNOSIS — M797 Fibromyalgia: Secondary | ICD-10-CM | POA: Diagnosis not present

## 2015-06-22 ENCOUNTER — Ambulatory Visit: Payer: Medicare Other | Admitting: Pulmonary Disease

## 2015-06-26 ENCOUNTER — Ambulatory Visit: Payer: Medicare Other | Admitting: Pulmonary Disease

## 2015-06-30 DIAGNOSIS — F410 Panic disorder [episodic paroxysmal anxiety] without agoraphobia: Secondary | ICD-10-CM | POA: Diagnosis not present

## 2015-06-30 DIAGNOSIS — F4310 Post-traumatic stress disorder, unspecified: Secondary | ICD-10-CM | POA: Diagnosis not present

## 2015-06-30 DIAGNOSIS — F411 Generalized anxiety disorder: Secondary | ICD-10-CM | POA: Diagnosis not present

## 2015-06-30 DIAGNOSIS — G894 Chronic pain syndrome: Secondary | ICD-10-CM | POA: Diagnosis not present

## 2015-06-30 DIAGNOSIS — M329 Systemic lupus erythematosus, unspecified: Secondary | ICD-10-CM | POA: Diagnosis not present

## 2015-07-15 DIAGNOSIS — G8929 Other chronic pain: Secondary | ICD-10-CM | POA: Diagnosis not present

## 2015-07-15 DIAGNOSIS — M545 Low back pain: Secondary | ICD-10-CM | POA: Diagnosis not present

## 2015-07-15 DIAGNOSIS — M797 Fibromyalgia: Secondary | ICD-10-CM | POA: Diagnosis not present

## 2015-07-20 DIAGNOSIS — F411 Generalized anxiety disorder: Secondary | ICD-10-CM | POA: Diagnosis not present

## 2015-07-20 DIAGNOSIS — F410 Panic disorder [episodic paroxysmal anxiety] without agoraphobia: Secondary | ICD-10-CM | POA: Diagnosis not present

## 2015-07-20 DIAGNOSIS — G894 Chronic pain syndrome: Secondary | ICD-10-CM | POA: Diagnosis not present

## 2015-07-20 DIAGNOSIS — F332 Major depressive disorder, recurrent severe without psychotic features: Secondary | ICD-10-CM | POA: Diagnosis not present

## 2015-07-20 DIAGNOSIS — F4310 Post-traumatic stress disorder, unspecified: Secondary | ICD-10-CM | POA: Diagnosis not present

## 2015-07-20 DIAGNOSIS — Z96651 Presence of right artificial knee joint: Secondary | ICD-10-CM | POA: Diagnosis not present

## 2015-07-20 DIAGNOSIS — M542 Cervicalgia: Secondary | ICD-10-CM | POA: Diagnosis not present

## 2015-07-20 DIAGNOSIS — M1990 Unspecified osteoarthritis, unspecified site: Secondary | ICD-10-CM | POA: Diagnosis not present

## 2015-07-20 DIAGNOSIS — M797 Fibromyalgia: Secondary | ICD-10-CM | POA: Diagnosis not present

## 2015-07-23 DIAGNOSIS — J3801 Paralysis of vocal cords and larynx, unilateral: Secondary | ICD-10-CM | POA: Diagnosis not present

## 2015-07-28 DIAGNOSIS — M329 Systemic lupus erythematosus, unspecified: Secondary | ICD-10-CM | POA: Diagnosis not present

## 2015-07-28 DIAGNOSIS — R5383 Other fatigue: Secondary | ICD-10-CM | POA: Diagnosis not present

## 2015-07-28 DIAGNOSIS — R0602 Shortness of breath: Secondary | ICD-10-CM | POA: Diagnosis not present

## 2015-07-28 DIAGNOSIS — J449 Chronic obstructive pulmonary disease, unspecified: Secondary | ICD-10-CM | POA: Diagnosis not present

## 2015-07-28 DIAGNOSIS — R05 Cough: Secondary | ICD-10-CM | POA: Diagnosis not present

## 2015-07-28 DIAGNOSIS — Z79899 Other long term (current) drug therapy: Secondary | ICD-10-CM | POA: Diagnosis not present

## 2015-08-03 DIAGNOSIS — G8929 Other chronic pain: Secondary | ICD-10-CM | POA: Diagnosis not present

## 2015-08-03 DIAGNOSIS — M797 Fibromyalgia: Secondary | ICD-10-CM | POA: Diagnosis not present

## 2015-08-03 DIAGNOSIS — M545 Low back pain: Secondary | ICD-10-CM | POA: Diagnosis not present

## 2015-08-06 ENCOUNTER — Ambulatory Visit: Payer: Medicare Other

## 2015-08-06 ENCOUNTER — Ambulatory Visit (INDEPENDENT_AMBULATORY_CARE_PROVIDER_SITE_OTHER): Payer: Medicare Other | Admitting: Family Medicine

## 2015-08-06 ENCOUNTER — Encounter: Payer: Self-pay | Admitting: Family Medicine

## 2015-08-06 VITALS — BP 120/76 | HR 95 | Temp 98.5°F | Resp 16 | Wt 241.5 lb

## 2015-08-06 DIAGNOSIS — M329 Systemic lupus erythematosus, unspecified: Secondary | ICD-10-CM | POA: Diagnosis not present

## 2015-08-06 DIAGNOSIS — F339 Major depressive disorder, recurrent, unspecified: Secondary | ICD-10-CM

## 2015-08-06 DIAGNOSIS — M545 Low back pain, unspecified: Secondary | ICD-10-CM

## 2015-08-06 DIAGNOSIS — M797 Fibromyalgia: Secondary | ICD-10-CM | POA: Diagnosis not present

## 2015-08-06 DIAGNOSIS — Z23 Encounter for immunization: Secondary | ICD-10-CM | POA: Diagnosis not present

## 2015-08-06 DIAGNOSIS — J8410 Pulmonary fibrosis, unspecified: Secondary | ICD-10-CM | POA: Diagnosis not present

## 2015-08-06 DIAGNOSIS — H15102 Unspecified episcleritis, left eye: Secondary | ICD-10-CM | POA: Diagnosis not present

## 2015-08-06 DIAGNOSIS — I10 Essential (primary) hypertension: Secondary | ICD-10-CM

## 2015-08-06 DIAGNOSIS — K219 Gastro-esophageal reflux disease without esophagitis: Secondary | ICD-10-CM | POA: Diagnosis not present

## 2015-08-06 DIAGNOSIS — G4700 Insomnia, unspecified: Secondary | ICD-10-CM | POA: Diagnosis not present

## 2015-08-06 MED ORDER — LOSARTAN POTASSIUM-HCTZ 100-25 MG PO TABS: 1.0000 | ORAL_TABLET | Freq: Every day | ORAL | Status: DC

## 2015-08-06 NOTE — Progress Notes (Signed)
Name: Nicole Ballard   MRN: [id number]    DOB: 1956-01-06   Date:08/06/2015       Progress Note  Subjective  Chief Complaint  Chief Complaint  Patient presents with  . Hypertension    patient is here for her 15-month follow-up  . Medication Refill  . Eye Pain    patient stated that she has been having some left eye pain for a couple of days. She is not sure if it is conjunctivitis or just dry eye. It is nonresponsive to otc drops for dry eyes.  . Back Pain    patient is unsure if it is due to her fibromyalgia or not.    HPI  HTN: she has been taking Losartan/HCTZ daily and decides side effects of medication. No chest pain or palpitation.   Post-inflammatory pulmonary fibrosis and asthma: continue to have SOB. She used to see Dr. Lake Bells but is currently seeing Pulmonologist at [facility].   SLE: sees Rheumatologist at [facility]. Taking Plaquenil, and recently has developed left eye redness and pain that started a few days ago. She states when first diagnosed had similar symptoms. Using eye drops otc but not improvement of symptoms. No blurred vision.   Major Depression Chronic: she is going to [facility] psychiatrist, and has not felt well discussing her problems with her, since she is seeing residents and she always sees someone different, she would like to see someone locally. Still taking Duloxetine, Trazodone, Lamictal and Clonazepam.  She still feels tired all the time, mental fogginess. Seeing therapist at the pain clinic and she enjoys seeing her.    Insomnia: taking Trazodone and has been sleeping well lately  Low back pain without radiculitis: started PT for FMS about one month ago, and is having generalized back pain, aching like, not severe but bothersome.   GERD: taking PPI, denies side effects, head of the bed is raised. No heartburn or regurgitation  Morbid obesity: she has been physically active, lost a lot weight since last year, but now has reached a plateau and is frustrated, but  still exercising but she states she has been skipping meals and does not have a balanced diet.   Patient Active Problem List   Diagnosis Date Noted  . Abnormal CAT scan 04/11/2015  . Auditory impairment 04/11/2015  . Insomnia, persistent 04/11/2015  . Chronic kidney disease (CKD), stage I 04/11/2015  . Chronic nonmalignant pain 04/11/2015  . Chronic constipation 04/11/2015  . Dyslipidemia 04/11/2015  . Fibromyalgia 04/11/2015  . Gastro-esophageal reflux disease without esophagitis 04/11/2015  . Dysphonia 04/11/2015  . Absence of bladder continence 04/11/2015  . LBP (low back pain) 04/11/2015  . Eczema intertrigo 04/11/2015  . Keratosis pilaris 04/11/2015  . Chronic recurrent major depressive disorder ([place]) 04/11/2015  . Dysmetabolic syndrome [id number]  . Neurogenic claudication 04/11/2015  . Perennial allergic rhinitis with seasonal variation 04/11/2015  . Abnormal presence of protein in urine 04/11/2015  . Seborrhea capitis 04/11/2015  . Moderate dysplasia of vulva 04/11/2015  . Vitamin D deficiency 04/11/2015  . Treadmill stress test negative for angina pectoris 03/05/2015  . Shortness of breath 05/20/2014  . Morbid obesity ([place]) 02/03/2014  . Asthma, chronic 12/31/2013  . H/O total knee replacement 12/31/2013  . Episcleritis 09/26/2013  . Vocal cord dysfunction 05/28/2013  . Anxiety, generalized 03/19/2013  . Back pain 12/18/2012  . Pulmonary nodules 11/26/2012  . Lung nodule, multiple 11/26/2012  . Arthritis, degenerative 11/01/2012  . H/O aspiration pneumonitis 10/22/2012  . Postinflammatory pulmonary fibrosis (  [place]) 10/22/2012  . Mixed incontinence 05/04/2012  . Benign neoplasm of pituitary gland and craniopharyngeal duct ([place]) 12/29/2010  . Inflammatory autoimmune disorder 11/05/2010  . Chronic nausea 07/01/2008  . Benign hypertension 01/25/2005    Past Surgical History  Procedure Laterality Date  . Cholecystectomy    . Vaginal hysterectomy    . Brain surgery       Family History  Problem Relation Age of Onset  . Breast cancer Mother   . Cancer Mother 22    Breast  . Cancer Father 28    Stomach  . Breast cancer Maternal Aunt     Social History   Social History  . Marital Status: Divorced    Spouse Name: N/A  . Number of Children: N/A  . Years of Education: N/A   Occupational History  . Not on file.   Social History Main Topics  . Smoking status: Never Smoker   . Smokeless tobacco: Never Used  . Alcohol Use: No  . Drug Use: No  . Sexual Activity: Not Currently   Other Topics Concern  . Not on file   Social History Narrative     Current outpatient prescriptions:  .  albuterol (PROAIR HFA) 108 (90 BASE) MCG/ACT inhaler, Inhale 2 puffs into the lungs every 6 (six) hours as needed for wheezing. DX code R06.02, Disp: 3 Inhaler, Rfl: 3 .  Cholecalciferol (VITAMIN D) 2000 UNITS CAPS, Take 1 capsule by mouth daily., Disp: , Rfl:  .  clonazePAM (KLONOPIN) 0.5 MG tablet, , Disp: , Rfl: 2 .  diclofenac sodium (VOLTAREN) 1 % GEL, APP 2 GRAMS EXT AA QID, Disp: , Rfl: 6 .  docusate sodium (COLACE) 100 MG capsule, Take 2 capsules (200 mg total) by mouth 2 (two) times daily., Disp: 120 capsule, Rfl: 0 .  DULoxetine (CYMBALTA) 60 MG capsule, Take 120 mg by mouth at bedtime. , Disp: , Rfl:  .  FLOVENT HFA 110 MCG/ACT inhaler, INHALE 2 PUFFS PO BID, Disp: , Rfl: 0 .  fluticasone (FLONASE) 50 MCG/ACT nasal spray, Place 2 sprays into both nostrils daily., Disp: 48 g, Rfl: 1 .  gabapentin (NEURONTIN) 300 MG capsule, Take 300 mg by mouth., Disp: , Rfl:  .  Glycerin, Laxative, (RA GLYCERIN ADULT) 80.7 % SUPP, Place rectally., Disp: , Rfl:  .  hydroxychloroquine (PLAQUENIL) 200 MG tablet, Take 200 mg by mouth 2 (two) times daily., Disp: , Rfl:  .  lamoTRIgine (LAMICTAL) 100 MG tablet, Take 1 tablet by mouth 3 (three) times daily., Disp: , Rfl:  .  losartan-hydrochlorothiazide (HYZAAR) 100-25 MG tablet, Take 1 tablet by mouth daily., Disp: 90  tablet, Rfl: 1 .  omeprazole (PRILOSEC) 40 MG capsule, Take 1 capsule (40 mg total) by mouth daily., Disp: 180 capsule, Rfl: 3 .  traZODone (DESYREL) 150 MG tablet, Take 150 mg by mouth at bedtime., Disp: , Rfl:  .  Multiple Vitamin (MULTIVITAMIN) tablet, Take 1 tablet by mouth daily., Disp: , Rfl:  .  polyethylene glycol powder (GLYCOLAX/MIRALAX) powder, 2 cap fulls in a full glass of water, three times a day, for 5 days. (Patient not taking: Reported on 08/06/2015), Disp: 255 g, Rfl: 0  Allergies  Allergen Reactions  . Ace Inhibitors     Other reaction(s): OTHER Pt states she can not take ace inhibitors. Pt states she can not remember her reaction.   Marland Kitchen Penicillins      ROS  Constitutional: Negative for fever or weight change.  Respiratory: Negative for cough but  has chronic shortness of breath.   Cardiovascular: Negative for chest pain or palpitations.  Gastrointestinal: Negative for abdominal pain, no bowel changes.  Musculoskeletal: Positive  for gait problem - needs to stop because of right knee pain , no joint swelling Skin: Negative for rash.  Neurological: She has intermittent vertigo, no headache.  No other specific complaints in a complete review of systems (except as listed in HPI above).  Objective  Filed Vitals:   08/06/15 1359  BP: 120/76  Pulse: 95  Temp: 98.5 F (36.9 C)  TempSrc: Oral  Resp: 16  Weight: 241 lb 8 oz (109.544 kg)  SpO2: 95%    Body mass index is 36.73 kg/(m^2).  Physical Exam  Constitutional: Patient appears well-developed and well-nourished. Obese  No distress.  HEENT: head atraumatic, normocephalic, pupils equal and reactive to lightneck supple, throat within normal limits Cardiovascular: Normal rate, regular rhythm and 2/6 SEM. No BLE edema. Pulmonary/Chest: Effort normal and breath sounds normal. No respiratory distress. Abdominal: Soft.  There is no tenderness. Psychiatric: Patient has a normal mood and affect. behavior is normal.  Judgment and thought content normal. Muscular Skeletal: trigger point positive, pain worse on lower back, negative straight leg raise  Recent Results (from the past 2160 hour(s))  IgE     Status: None   Collection Time: 05/22/15 10:07 AM  Result Value Ref Range   IgE (Immunoglobulin E), Serum 12 0 - 100 IU/mL    Comment: (NOTE) Performed At: [facility] [place], [place] [id number] Lindon Romp MD [id number]   Miscellaneous [facility] test (send-out)     Status: None   Collection Time: 05/22/15 10:07 AM  Result Value Ref Range   [facility] test code ALLERGEN PROFILE ALLERGEN ZONE 2    [facility] test name [id number]    Misc [facility] result COMMENT     Comment: (NOTE) Test Ordered: [id number] Allergens, Zone 2 Class Description              Comment                   BN      Levels of Specific IgE       Class  Description of Class    ---------------------------  -----  --------------------                   < 0.10         0         Negative           0.10 -    0.31         0/I       Equivocal/Low           0.32 -    0.55         I         Low           0.56 -    1.40         II        Moderate           1.41 -    3.90         III       High           3.91 -   19.00         IV        Very High  19.01 -  100.00         V         Very High                  >100.00         VI        Very High D001-IgE D pteronyssinus       <0.10            kU/L     BN     Reference Range: Class 0                               D002-IgE D farinae             <0.10            kU/L     BN     Reference Range: Class 0                               E001-IgE Cat Dander            <0.10            kU/L     BN     Reference Range: Class 0                                E005-IgE Dog Dander            <0.10            kU/L     BN     Reference Range: Class 0                               G002-IgE [place] Grass         <0.10            kU/L     BN     Reference Range: Class 0                                G006-IgE Timothy Grass         <0.10            kU/L     BN     Reference Range: Class 0                               G010-IgE Johnson Grass         <0.10            kU/L     BN     Reference Range: Class 0                               G017-IgE Bahia Grass           <0.10            kU/L     BN     Reference Range: Class 0  I206-IgE Cockroach, American   <0.10            kU/L     BN     Reference Range: Class 0                               This test was developed and its performance characteristics determined by [facility].  It has not been cleared or approved by the U.S. Food and Drug Administration. The FDA has determined that such clearance or approval is not necessary. This test is use d for clinical purposes.  It should not be regarded as investigational or for research. M001-IgE Penicillium chrysogen <0.10            kU/L     BN     Reference Range: Class 0                               M002-IgE Cladosporium herbarum <0.10            kU/L     BN     Reference Range: Class 0                               M003-IgE Aspergillus fumigatus <0.10            kU/L     BN     Reference Range: Class 0                               M004-IgE Mucor racemosus       <0.10            kU/L     BN     Reference Range: Class 0                               M006-IgE Alternaria alternata  <0.10            kU/L     BN     Reference Range: Class 0                               M010-IgE Stemphylium herbarum  <0.10            kU/L     BN     Reference Range: Class 0                               T003-IgE Common Silver Birch   <0.10            kU/L     BN     Reference Range: Class 0                               T007-IgE Oak, White            <0.10            kU/L     BN      Reference Range: Class 0  T008-IgE Elm, American         <0.10            kU/L     BN     Reference Range: Class 0                               T001-IgE  Maple/Box Elder       <0.10            kU/L     BN     Reference Range: Class 0                               T041-IgE Hickory, White        <0.10            kU/L     BN     Reference Range: Class 0                               This test was developed and its performance characteristics determined by [facility].  It has not been cleared or approved by the U.S. Food and Drug Administration. The FDA has determined that such clearance or approval is not necessary. This test is used for clinical purposes.  It should not be regarded as investigational or for research. T011-IgE Maple Leaf Sycamore   <0.10            kU/L     BN     Reference Range: Class 0                               T070-IgE White Mulberry        <0.10            kU/L     BN     Reference Range: Class 0                                T211-IgE Sweet Gum             <0.10            kU/L     BN     Reference Range: Class 0                               This test was developed and its performance characteristics determined by [facility].  It has not been cleared or approved by the U.S. Food and Drug Administration. The FDA has determined that such clearance or approval is not necessary. This test is used for clinical purposes.  It should not be regarded as investigational or for research. T006-IgE Rockmart       <0.10            kU/L     BN     Reference Range: Class 0                               W001-IgE Ragweed, Short        <0.10            kU/L  BN     Reference Range: Class 0                               W006-IgE Mugwort               <0.10            kU/L     BN     Reference Range: Class 0                               W009-IgE Plantain, English     <0.10            kU/L     BN     Reference Range: Class 0                               W014-IgE Pigweed, Common       <0.10             kU/L     BN     Reference Range: Class 0                               W018-IgE Sheep Sorrel          <0.10            kU/L      BN     Reference Range: Class 0                               W020-IgE Nettle                <0.10            kU/L     BN     Reference Range: Class 0                               Performed At: [facility] [place], [place] [id number] Lindon Romp MD [id number]   CBC with Differential/Platelet     Status: Abnormal   Collection Time: 05/29/15  9:12 AM  Result Value Ref Range   WBC 4.3 3.6 - 11.0 K/uL   RBC 4.94 3.80 - 5.20 MIL/uL   Hemoglobin 13.4 12.0 - 16.0 g/dL   HCT 41.1 35.0 - 47.0 %   MCV 83.2 80.0 - 100.0 fL   MCH 27.2 26.0 - 34.0 pg   MCHC 32.6 32.0 - 36.0 g/dL   RDW 14.0 11.5 - 14.5 %   Platelets 327 150 - 440 K/uL   Neutrophils Relative % 47 %   Neutro Abs 2.0 1.4 - 6.5 K/uL   Lymphocytes Relative 35 %   Lymphs Abs 1.5 1.0 - 3.6 K/uL   Monocytes Relative 11 %   Monocytes Absolute 0.5 0.2 - 0.9 K/uL   Eosinophils Relative 2 %   Eosinophils Absolute 0.1 0 - 0.7 K/uL   Basophils Relative 5 %   Basophils Absolute 0.2 (H) 0 - 0.1 K/uL   PHQ2/9: Depression screen [facility] 2/9 08/06/2015 04/16/2015  Decreased Interest 0 0  Down, Depressed, Hopeless 1 1  PHQ - 2 Score 1 1  Fall Risk: Fall Risk  08/06/2015 04/16/2015  Falls in the past year? No Yes  Number falls in past yr: - 2 or more  Injury with Fall? - No     Functional Status Survey: Is the patient deaf or have difficulty hearing?: Yes (patient cannot hear out of her right ear) Does the patient have difficulty seeing, even when wearing glasses/contacts?: No Does the patient have difficulty concentrating, remembering, or making decisions?: Yes (patient stated that she is not sure if it is due to age or lupus.) Does the patient have difficulty walking or climbing stairs?: No Does the patient have difficulty dressing or bathing?: No Does the patient have difficulty doing errands alone such as visiting a doctor's office or shopping?: No    Assessment & Plan  1.  Fibromyalgia  Continue PT, gabapentin and Cymbalta  2. Chronic recurrent major depressive disorder ([place])  Refer to local psychiatrist for further management - Ambulatory referral to Psychiatry  3. Lupus (systemic lupus erythematosus) ([place])  Continue follow up with [facility]ologist at [facility]  4. Episcleritis, left  - Ambulatory referral to Ophthalmology  5. Insomnia, persistent  Continue Trazodone  6. Postinflammatory pulmonary fibrosis ([facility])  Continue follow up with Pulmonologist at [facility] - no longer seeing Dr. Lake Bells  7. Benign hypertension  - losartan-hydrochlorothiazide (HYZAAR) 100-25 MG tablet; Take 1 tablet by mouth daily.  Dispense: 90 tablet; Refill: 1  8. Gastro-esophageal reflux disease without esophagitis  stable  9. Midline low back pain without sciatica  Continue PT, advised her not to stop, continue follow up with pain clinic at [facility]  10. Morbid obesity, unspecified obesity type [facility])  Discussed with the patient the risk posed by an increased BMI. Discussed importance of portion control, calorie counting and at least 150 minutes of physical activity weekly. Avoid sweet beverages and drink more water. Eat at least 6 servings of fruit and vegetables daily    11. Need for Streptococcus pneumoniae and influenza vaccination  - Pneumococcal conjugate vaccine 13-valent IM

## 2015-08-07 ENCOUNTER — Ambulatory Visit: Payer: Medicare Other

## 2015-08-11 ENCOUNTER — Telehealth: Payer: Self-pay | Admitting: Family Medicine

## 2015-08-12 ENCOUNTER — Ambulatory Visit (INDEPENDENT_AMBULATORY_CARE_PROVIDER_SITE_OTHER): Payer: Medicare Other

## 2015-08-12 DIAGNOSIS — Z23 Encounter for immunization: Secondary | ICD-10-CM

## 2015-08-14 DIAGNOSIS — H15102 Unspecified episcleritis, left eye: Secondary | ICD-10-CM | POA: Diagnosis not present

## 2015-08-17 ENCOUNTER — Telehealth: Payer: Self-pay | Admitting: *Deleted

## 2015-08-17 NOTE — Telephone Encounter (Signed)
LMOM for pt to return call to me in regards to her appt with DR on Tuesday.

## 2015-08-18 ENCOUNTER — Encounter: Payer: Self-pay | Admitting: *Deleted

## 2015-08-18 ENCOUNTER — Encounter: Payer: Medicare Other | Admitting: Internal Medicine

## 2015-08-18 NOTE — Progress Notes (Signed)
* [facility]  The patient has not completed her CT of the chest, she has canceled multiple appointments for the CT.  NO-SHOW FOR APPT.    Assessment and Plan:  Chronic asthma -Continue Dulera. - IgE level was 12; Rast results pending.  -After Rast testing, we'll start the patient on Claritin-D once daily, I've also asked her to switch her Flonase 2 at night and take Mucinex DM twice daily to better control her expectoration and bronchitis symptoms. Could also consider adding an inhaled LAMA.  Dyspnea. -I suspect that this is multifactorial from sinus drainage, restrictive lung disease with obesity, elevated diaphragms, particularly on the left side, GERD with possible aspiration, obesity, possible asthma and vocal cord dysfunction.  Vocal cord dysfunction. -The patient has been going for vocal cord therapy training.  Chronic hoarseness.  -Possibly secondary to chronic sinus drainage and/or reflux.  Lung nodules -Previously seen in CT chest from December 2015, and first seen in March 2014. There was no evidence of interstitial lung disease noted on CT scans. -We'll repeat CT chest in December of this year, which should complete surveillance. Her nodules appear to be low risk for cancer this time and may be related to reflux pneumonitis. We'll have the patient follow-up after her CT of the chest is completed.  Restrictive lung disease -Likely due to body habitus.  Obesity  Chronic Rhinitis -Change flonase to every night. Start Claritin-D after RAST testing.    Date: 08/18/2015  MRN# [id number] Nicole Ballard 1956/01/03   Nicole Ballard is a 59 y.o. old female seen in follow up for chief complaint of  No chief complaint on file.    HPI:     No flowsheet data found.  Pulmonary Functions Testing Results:  TLC  Date Value Ref Range Status  03/16/2015 3.15 L Final     Medication:   Outpatient Encounter Prescriptions as of 08/18/2015    Medication Sig  . albuterol (PROAIR HFA) 108 (90 BASE) MCG/ACT inhaler Inhale 2 puffs into the lungs every 6 (six) hours as needed for wheezing. DX code R06.02  . Cholecalciferol (VITAMIN D) 2000 UNITS CAPS Take 1 capsule by mouth daily.  . clonazePAM (KLONOPIN) 0.5 MG tablet   . diclofenac sodium (VOLTAREN) 1 % GEL APP 2 GRAMS EXT AA QID  . docusate sodium (COLACE) 100 MG capsule Take 2 capsules (200 mg total) by mouth 2 (two) times daily.  . DULoxetine (CYMBALTA) 60 MG capsule Take 120 mg by mouth at bedtime.   Marland Kitchen FLOVENT HFA 110 MCG/ACT inhaler INHALE 2 PUFFS PO BID  . fluticasone (FLONASE) 50 MCG/ACT nasal spray Place 2 sprays into both nostrils daily.  Marland Kitchen gabapentin (NEURONTIN) 300 MG capsule Take 300 mg by mouth.  . Glycerin, Laxative, (RA GLYCERIN ADULT) 80.7 % SUPP Place rectally.  . hydroxychloroquine (PLAQUENIL) 200 MG tablet Take 200 mg by mouth 2 (two) times daily.  Marland Kitchen lamoTRIgine (LAMICTAL) 100 MG tablet Take 1 tablet by mouth 3 (three) times daily.  Marland Kitchen losartan-hydrochlorothiazide (HYZAAR) 100-25 MG tablet Take 1 tablet by mouth daily.  . Multiple Vitamin (MULTIVITAMIN) tablet Take 1 tablet by mouth daily.  Marland Kitchen omeprazole (PRILOSEC) 40 MG capsule Take 1 capsule (40 mg total) by mouth daily.  . polyethylene glycol powder (GLYCOLAX/MIRALAX) powder 2 cap fulls in a full glass of water, three times a day, for 5 days. (Patient not taking: Reported on 08/06/2015)  . traZODone (DESYREL) 150 MG tablet Take 150 mg by mouth at bedtime.  No facility-administered encounter medications on file as of 08/18/2015.     Allergies:  Ace inhibitors and Penicillins  Review of Systems: Gen:  Denies  fever, sweats. HEENT: Denies blurred vision. Cvc:  No dizziness, chest pain or heaviness Resp:   Denies cough or sputum porduction. Gi: Denies swallowing difficulty, stomach pain. constipation, bowel incontinence Gu:  Denies bladder incontinence, burning urine Ext:   No Joint pain, stiffness. Skin:  No skin rash, easy bruising. Endoc:  No polyuria, polydipsia. Psych: No depression, insomnia. Other:  All other systems were reviewed and found to be negative other than what is mentioned in the HPI.   Physical Examination:   VS: There were no vitals taken for this visit.  General Appearance: No distress  Neuro:without focal findings,  speech normal,  HEENT: PERRLA, EOM intact. Pulmonary: normal breath sounds, No wheezing.   CardiovascularNormal S1,S2.  No m/r/g.   Abdomen: Benign, Soft, non-tender. Renal:  No costovertebral tenderness  GU:  Not performed at this time. Endoc: No evident thyromegaly, no signs of acromegaly. Skin:   warm, no rash. Extremities: normal, no cyanosis, clubbing.   LABORATORY PANEL:   CBC No results for input(s): WBC, HGB, HCT, PLT in the last 168 hours. ------------------------------------------------------------------------------------------------------------------  Chemistries  No results for input(s): NA, K, CL, CO2, GLUCOSE, BUN, CREATININE, CALCIUM, MG, AST, ALT, ALKPHOS, BILITOT in the last 168 hours.  Invalid input(s): GFRCGP ------------------------------------------------------------------------------------------------------------------  Cardiac Enzymes No results for input(s): TROPONINI in the last 168 hours. ------------------------------------------------------------  RADIOLOGY:   No results found for this or any previous visit. Results for orders placed during the hospital encounter of 04/28/15  DG Chest 2 View   Narrative CLINICAL DATA:  Productive cough for 4 days.  EXAM: CHEST  2 VIEW  COMPARISON:  05/20/2014  FINDINGS: The heart size and mediastinal contours are within normal limits. Both lungs are clear. The visualized skeletal structures are unremarkable.  IMPRESSION: No active cardiopulmonary disease.   Electronically Signed   By: Rolm Baptise M.D.   On: 04/28/2015 13:19     ------------------------------------------------------------------------------------------------------------------  Thank  you for allowing [facility] to assist in the care of your patient. Our recommendations are noted above.  Please contact us if we can be of further service.   Marda Stalker, MD.  [facility] Office Number: [phone]  Patricia Pesa, M.D.  Vilinda Boehringer, M.D.  Merton Border, M.D  This encounter was created in error - please disregard.

## 2015-08-18 NOTE — Telephone Encounter (Signed)
Pt no showed for appt today. DR aware pt cancelled CT scan multiple times.

## 2015-09-02 DIAGNOSIS — M545 Low back pain: Secondary | ICD-10-CM | POA: Diagnosis not present

## 2015-09-02 DIAGNOSIS — M797 Fibromyalgia: Secondary | ICD-10-CM | POA: Diagnosis not present

## 2015-09-02 DIAGNOSIS — G8929 Other chronic pain: Secondary | ICD-10-CM | POA: Diagnosis not present

## 2015-09-03 NOTE — Telephone Encounter (Signed)
ERRENOUS °

## 2015-09-11 ENCOUNTER — Ambulatory Visit (INDEPENDENT_AMBULATORY_CARE_PROVIDER_SITE_OTHER): Payer: Medicare Other | Admitting: Family Medicine

## 2015-09-11 ENCOUNTER — Encounter: Payer: Self-pay | Admitting: Family Medicine

## 2015-09-11 VITALS — BP 102/64 | HR 75 | Temp 98.9°F | Resp 16 | Ht 68.0 in | Wt 241.8 lb

## 2015-09-11 DIAGNOSIS — R05 Cough: Secondary | ICD-10-CM | POA: Diagnosis not present

## 2015-09-11 DIAGNOSIS — R059 Cough, unspecified: Secondary | ICD-10-CM | POA: Insufficient documentation

## 2015-09-11 DIAGNOSIS — J0100 Acute maxillary sinusitis, unspecified: Secondary | ICD-10-CM

## 2015-09-11 MED ORDER — HYDROCOD POLST-CPM POLST ER 10-8 MG/5ML PO SUER: 5.0000 mL | Freq: Two times a day (BID) | ORAL | Status: DC | PRN

## 2015-09-11 MED ORDER — AZITHROMYCIN 500 MG PO TABS: 500.0000 mg | ORAL_TABLET | Freq: Every day | ORAL | Status: DC

## 2015-09-11 NOTE — Progress Notes (Signed)
Name: Nicole Ballard   MRN: [id number]    DOB: 12/11/1955   Date:09/11/2015       Progress Note  Subjective  Chief Complaint  Chief Complaint  Patient presents with  . URI    HPI  Patient is here today with concerns regarding the following symptoms sore throat, congestion, sneezing, sinus pressure, productive cough and achiness that started 2 weeks ago.  Associated with chills, sweats, fatigue, malaise and anorexia. Not associated with fever. Has tried the following home remedies: Robutussion DM tylenol.   Past Medical History  Diagnosis Date  . Fibromyalgia   . Paresthesia   . GERD (gastroesophageal reflux disease)   . Rhinitis   . Systemic lupus erythematosus ([place])   . Hemifacial spasm   . Chronic kidney disease   . Chronic pain   . Anxiety   . Vitamin D deficiency   . Bipolar 1 disorder ([place])   . Hypertension   . Osteoarthritis   . Lumbago   . Insomnia   . Hyperlipidemia   . Hearing loss of right ear     Social History  Substance Use Topics  . Smoking status: Never Smoker   . Smokeless tobacco: Never Used  . Alcohol Use: No     Current outpatient prescriptions:  .  albuterol (PROAIR HFA) 108 (90 BASE) MCG/ACT inhaler, Inhale 2 puffs into the lungs every 6 (six) hours as needed for wheezing. DX code R06.02, Disp: 3 Inhaler, Rfl: 3 .  Cholecalciferol (VITAMIN D) 2000 UNITS CAPS, Take 1 capsule by mouth daily., Disp: , Rfl:  .  clonazePAM (KLONOPIN) 0.5 MG tablet, , Disp: , Rfl: 2 .  diclofenac sodium (VOLTAREN) 1 % GEL, APP 2 GRAMS EXT AA QID, Disp: , Rfl: 6 .  docusate sodium (COLACE) 100 MG capsule, Take 2 capsules (200 mg total) by mouth 2 (two) times daily., Disp: 120 capsule, Rfl: 0 .  DULoxetine (CYMBALTA) 60 MG capsule, Take 120 mg by mouth at bedtime. , Disp: , Rfl:  .  FLOVENT HFA 110 MCG/ACT inhaler, INHALE 2 PUFFS PO BID, Disp: , Rfl: 0 .  fluticasone (FLONASE) 50 MCG/ACT nasal spray, Place 2 sprays into both nostrils daily., Disp: 48 g, Rfl: 1 .   gabapentin (NEURONTIN) 300 MG capsule, Take 300 mg by mouth., Disp: , Rfl:  .  Glycerin, Laxative, (RA GLYCERIN ADULT) 80.7 % SUPP, Place rectally., Disp: , Rfl:  .  hydroxychloroquine (PLAQUENIL) 200 MG tablet, Take 200 mg by mouth 2 (two) times daily., Disp: , Rfl:  .  lamoTRIgine (LAMICTAL) 100 MG tablet, Take 1 tablet by mouth 3 (three) times daily., Disp: , Rfl:  .  losartan-hydrochlorothiazide (HYZAAR) 100-25 MG tablet, Take 1 tablet by mouth daily., Disp: 90 tablet, Rfl: 1 .  Multiple Vitamin (MULTIVITAMIN) tablet, Take 1 tablet by mouth daily., Disp: , Rfl:  .  omeprazole (PRILOSEC) 40 MG capsule, Take 1 capsule (40 mg total) by mouth daily., Disp: 180 capsule, Rfl: 3 .  polyethylene glycol powder (GLYCOLAX/MIRALAX) powder, 2 cap fulls in a full glass of water, three times a day, for 5 days. (Patient not taking: Reported on 08/06/2015), Disp: 255 g, Rfl: 0 .  traZODone (DESYREL) 150 MG tablet, Take 150 mg by mouth at bedtime., Disp: , Rfl:   Allergies  Allergen Reactions  . Ace Inhibitors     Other reaction(s): OTHER Pt states she can not take ace inhibitors. Pt states she can not remember her reaction.   Marland Kitchen Penicillins  ROS  Positive for fatigue, nasal congestion, sinus pressure, ear fullness, cough as mentioned in HPI, otherwise all systems reviewed and are negative.  Objective  Filed Vitals:   09/11/15 1421  BP: 102/64  Pulse: 75  Temp: 98.9 F (37.2 C)  TempSrc: Oral  Resp: 16  Height: 5\' 8"  (1.727 m)  Weight: 241 lb 12.8 oz (109.68 kg)  SpO2: 96%   Body mass index is 36.77 kg/(m^2).   Physical Exam  Constitutional: Patient appears well-developed and well-nourished. In no acute distress but does appear to be fatigued from acute illness. HEENT:  - Head: Normocephalic and atraumatic.  - Ears: RIGHT TM bulging with minimal clear exudate, LEFT TM bulging with minimal clear exudate.  - Nose: Nasal mucosa boggy and congested.  - Mouth/Throat: Oropharynx is moist  with slight erythema of bilateral tonsils without hypertrophy or exudates. Post nasal drainage present.  - Eyes: Conjunctivae clear, EOM movements normal. PERRLA. No scleral icterus.  Neck: Normal range of motion. Neck supple. No JVD present. No thyromegaly present. No local lymphadenopathy. Cardiovascular: Regular rate, regular rhythm with no murmurs heard.  Pulmonary/Chest: Effort normal and breath sounds clear in all lung fields.  Musculoskeletal: Normal range of motion bilateral UE and LE, no joint effusions. Skin: Skin is warm and dry. No rash noted. Psychiatric: Patient has a normal mood and affect. Behavior is normal in office today. Judgment and thought content normal in office today.   Assessment & Plan  1. Acute maxillary sinusitis, recurrence not specified Etiologies include initial allergic rhinitis or viral infection progressing to superimposed bacterial infection. Instructed patient on increasing hydration, nasal saline spray, steam inhalation, NSAID if tolerated and not contraindicated. If not already doing so start taking daily anti-histamine and use a steroid nasal spray. If symptoms persist/worsen may consider antibiotic therapy.  - azithromycin (ZITHROMAX) 500 MG tablet; Take 1 tablet (500 mg total) by mouth daily.  Dispense: 7 tablet; Refill: 0 - chlorpheniramine-HYDROcodone (TUSSIONEX PENNKINETIC ER) 10-8 MG/5ML SUER; Take 5 mLs by mouth every 12 (twelve) hours as needed.  Dispense: 115 mL; Refill: 0  2. Cough  - azithromycin (ZITHROMAX) 500 MG tablet; Take 1 tablet (500 mg total) by mouth daily.  Dispense: 7 tablet; Refill: 0 - chlorpheniramine-HYDROcodone (TUSSIONEX PENNKINETIC ER) 10-8 MG/5ML SUER; Take 5 mLs by mouth every 12 (twelve) hours as needed.  Dispense: 115 mL; Refill: 0

## 2015-09-11 NOTE — Patient Instructions (Signed)
1) Pick up Delsym cough syrup OTC if the prescription Tussionex is not covered by your insurance

## 2015-09-14 DIAGNOSIS — J45909 Unspecified asthma, uncomplicated: Secondary | ICD-10-CM | POA: Diagnosis not present

## 2015-09-14 DIAGNOSIS — N181 Chronic kidney disease, stage 1: Secondary | ICD-10-CM | POA: Diagnosis not present

## 2015-09-14 DIAGNOSIS — H905 Unspecified sensorineural hearing loss: Secondary | ICD-10-CM | POA: Diagnosis not present

## 2015-09-14 DIAGNOSIS — M797 Fibromyalgia: Secondary | ICD-10-CM | POA: Diagnosis not present

## 2015-09-14 DIAGNOSIS — K5900 Constipation, unspecified: Secondary | ICD-10-CM | POA: Diagnosis not present

## 2015-09-14 DIAGNOSIS — K5909 Other constipation: Secondary | ICD-10-CM | POA: Diagnosis not present

## 2015-09-14 DIAGNOSIS — I129 Hypertensive chronic kidney disease with stage 1 through stage 4 chronic kidney disease, or unspecified chronic kidney disease: Secondary | ICD-10-CM | POA: Diagnosis not present

## 2015-09-14 DIAGNOSIS — M1990 Unspecified osteoarthritis, unspecified site: Secondary | ICD-10-CM | POA: Diagnosis not present

## 2015-09-14 DIAGNOSIS — K219 Gastro-esophageal reflux disease without esophagitis: Secondary | ICD-10-CM | POA: Diagnosis not present

## 2015-09-29 ENCOUNTER — Ambulatory Visit (INDEPENDENT_AMBULATORY_CARE_PROVIDER_SITE_OTHER): Payer: Medicare Other | Admitting: Family Medicine

## 2015-09-29 ENCOUNTER — Encounter: Payer: Self-pay | Admitting: Family Medicine

## 2015-09-29 VITALS — BP 128/82 | HR 76 | Temp 98.3°F | Resp 16 | Ht 68.0 in | Wt 240.3 lb

## 2015-09-29 DIAGNOSIS — K146 Glossodynia: Secondary | ICD-10-CM

## 2015-09-29 DIAGNOSIS — R05 Cough: Secondary | ICD-10-CM

## 2015-09-29 DIAGNOSIS — F418 Other specified anxiety disorders: Secondary | ICD-10-CM | POA: Diagnosis not present

## 2015-09-29 DIAGNOSIS — R42 Dizziness and giddiness: Secondary | ICD-10-CM

## 2015-09-29 DIAGNOSIS — R059 Cough, unspecified: Secondary | ICD-10-CM

## 2015-09-29 DIAGNOSIS — K1379 Other lesions of oral mucosa: Secondary | ICD-10-CM | POA: Diagnosis not present

## 2015-09-29 DIAGNOSIS — R110 Nausea: Secondary | ICD-10-CM

## 2015-09-29 DIAGNOSIS — K136 Irritative hyperplasia of oral mucosa: Secondary | ICD-10-CM

## 2015-09-29 MED ORDER — ONDANSETRON HCL 4 MG PO TABS: 4.0000 mg | ORAL_TABLET | Freq: Three times a day (TID) | ORAL | Status: DC | PRN

## 2015-09-29 MED ORDER — MECLIZINE HCL 25 MG PO TABS: 25.0000 mg | ORAL_TABLET | Freq: Three times a day (TID) | ORAL | Status: DC | PRN

## 2015-09-29 MED ORDER — FIRST-DUKES MOUTHWASH MT SUSP: 15.0000 mL | Freq: Three times a day (TID) | OROMUCOSAL | Status: DC

## 2015-09-29 NOTE — Progress Notes (Signed)
Name: Nicole Ballard   MRN: [id number]    DOB: [birth year]   Date:09/29/2015       Progress Note  Subjective  Chief Complaint  Chief Complaint  Patient presents with  . Numbness    patient stated that her lips are numb and her tongue has been burning  . Nausea  . Hoarse    HPI  Burning Mouth: she has noticed recurrence of burning and discomfort in her tongue and lipids, symptoms are now constant. Biotin was helping for a while, but now even brushing her teeth she has the discomfort. Her appetite is normal, she lost 2 lbs since last visit, but she is trying to lose weight  Chronic nausea: she has been taking Zofran twice daily to control her symptoms. Seen by GI and had EGD and colonoscopy, she needs refills of her medication. Explained to her it may even be secondary to the amount of pills she takes daily .  Dysphonia: she sees ENT in [facility], had Speech therapy but it has not helped, she states she is always hoarse.  Cough: she was diagnosed with a sinus infection a few weeks ago, she finished antibiotics and took a cough suppressant medication but she is still having a cough, sometimes productive, no fever, no chills, no longer has nasal drainage. She sees Pulmonologist for asthma and will have repeat CT chest for evaluation of pulmonary nodules. Explained the cough may be post-bronchial, but the CT will also give [place] some information  Chronic Vertigo: she has intermittent symptoms for years, she sees ENT in [place]. She describes the symptoms as a spinning sensation, associated with nausea, it can last minutes to days.  Occasionally has associated headache, at times some difficulty with balance. She denies weakness.    Patient Active Problem List   Diagnosis Date Noted  . Anxiety about health 09/29/2015  . Cough 09/11/2015  . Abnormal CAT scan 04/11/2015  . Auditory impairment 04/11/2015  . Insomnia, persistent 04/11/2015  . Chronic kidney disease (CKD), stage I  04/11/2015  . Chronic nonmalignant pain 04/11/2015  . Chronic constipation 04/11/2015  . Dyslipidemia 04/11/2015  . Fibromyalgia 04/11/2015  . Gastro-esophageal reflux disease without esophagitis 04/11/2015  . Dysphonia 04/11/2015  . Absence of bladder continence 04/11/2015  . LBP (low back pain) 04/11/2015  . Eczema intertrigo 04/11/2015  . Keratosis pilaris 04/11/2015  . Chronic recurrent major depressive disorder ([place]) 04/11/2015  . Dysmetabolic syndrome [id number]  . Neurogenic claudication 04/11/2015  . Perennial allergic rhinitis with seasonal variation 04/11/2015  . Abnormal presence of protein in urine 04/11/2015  . Seborrhea capitis 04/11/2015  . Moderate dysplasia of vulva 04/11/2015  . Vitamin D deficiency 04/11/2015  . Treadmill stress test negative for angina pectoris 03/05/2015  . Shortness of breath 05/20/2014  . Morbid obesity ([place]) 02/03/2014  . Asthma, chronic 12/31/2013  . H/O total knee replacement 12/31/2013  . Episcleritis 09/26/2013  . Vocal cord dysfunction 05/28/2013  . Anxiety, generalized 03/19/2013  . Back pain 12/18/2012  . Pulmonary nodules 11/26/2012  . Lung nodule, multiple 11/26/2012  . Arthritis, degenerative 11/01/2012  . H/O aspiration pneumonitis 10/22/2012  . Postinflammatory pulmonary fibrosis ([place]) 10/22/2012  . Mixed incontinence 05/04/2012  . Benign neoplasm of pituitary gland and craniopharyngeal duct ([place]) 12/29/2010  . Lupus (systemic lupus erythematosus) ([place]) 11/05/2010  . Chronic nausea 07/01/2008  . Benign hypertension 01/25/2005    Past Surgical History  Procedure Laterality Date  . Cholecystectomy    . Vaginal hysterectomy    .  Brain surgery      Family History  Problem Relation Age of Onset  . Breast cancer Mother   . Cancer Mother 54    Breast  . Cancer Father 73    Stomach  . Breast cancer Maternal Aunt     Social History   Social History  . Marital Status: Divorced    Spouse Name: N/A  . Number of  Children: N/A  . Years of Education: N/A   Occupational History  . Not on file.   Social History Main Topics  . Smoking status: Never Smoker   . Smokeless tobacco: Never Used  . Alcohol Use: No  . Drug Use: No  . Sexual Activity: Not Currently   Other Topics Concern  . Not on file   Social History Narrative     Current outpatient prescriptions:  .  albuterol (PROAIR HFA) 108 (90 BASE) MCG/ACT inhaler, Inhale 2 puffs into the lungs every 6 (six) hours as needed for wheezing. DX code R06.02, Disp: 3 Inhaler, Rfl: 3 .  Cholecalciferol (VITAMIN D) 2000 UNITS CAPS, Take 1 capsule by mouth daily., Disp: , Rfl:  .  clonazePAM (KLONOPIN) 0.5 MG tablet, , Disp: , Rfl: 2 .  diclofenac sodium (VOLTAREN) 1 % GEL, APP 2 GRAMS EXT AA QID, Disp: , Rfl: 6 .  Diphenhyd-Hydrocort-Nystatin (FIRST-DUKES MOUTHWASH) SUSP, Use as directed 15 mLs in the mouth or throat 4 (four) times daily -  before meals and at bedtime., Disp: 237 mL, Rfl: 0 .  docusate sodium (COLACE) 100 MG capsule, Take 2 capsules (200 mg total) by mouth 2 (two) times daily., Disp: 120 capsule, Rfl: 0 .  DULoxetine (CYMBALTA) 60 MG capsule, Take 120 mg by mouth at bedtime. , Disp: , Rfl:  .  FLOVENT HFA 110 MCG/ACT inhaler, INHALE 2 PUFFS PO BID, Disp: , Rfl: 0 .  fluticasone (FLONASE) 50 MCG/ACT nasal spray, Place 2 sprays into both nostrils daily., Disp: 48 g, Rfl: 1 .  gabapentin (NEURONTIN) 300 MG capsule, Take 300 mg by mouth., Disp: , Rfl:  .  Glycerin, Laxative, (RA GLYCERIN ADULT) 80.7 % SUPP, Place rectally., Disp: , Rfl:  .  hydroxychloroquine (PLAQUENIL) 200 MG tablet, Take 200 mg by mouth 2 (two) times daily., Disp: , Rfl:  .  lamoTRIgine (LAMICTAL) 100 MG tablet, Take 1 tablet by mouth 3 (three) times daily., Disp: , Rfl:  .  losartan-hydrochlorothiazide (HYZAAR) 100-25 MG tablet, Take 1 tablet by mouth daily., Disp: 90 tablet, Rfl: 1 .  meclizine (ANTIVERT) 25 MG tablet, Take 1 tablet (25 mg total) by mouth 3 (three)  times daily as needed., Disp: 30 tablet, Rfl: 0 .  Multiple Vitamin (MULTIVITAMIN) tablet, Take 1 tablet by mouth daily., Disp: , Rfl:  .  omeprazole (PRILOSEC) 40 MG capsule, Take 1 capsule (40 mg total) by mouth daily., Disp: 180 capsule, Rfl: 3 .  ondansetron (ZOFRAN) 4 MG tablet, Take 1 tablet (4 mg total) by mouth every 8 (eight) hours as needed for nausea or vomiting., Disp: 180 tablet, Rfl: 0 .  polyethylene glycol powder (GLYCOLAX/MIRALAX) powder, 2 cap fulls in a full glass of water, three times a day, for 5 days. (Patient not taking: Reported on 08/06/2015), Disp: 255 g, Rfl: 0 .  traZODone (DESYREL) 150 MG tablet, Take 150 mg by mouth at bedtime., Disp: , Rfl:   Allergies  Allergen Reactions  . Ace Inhibitors     Other reaction(s): OTHER Pt states she can not take ace inhibitors. Pt states  she can not remember her reaction.   Marland Kitchen Penicillins      ROS  Ten systems reviewed and is negative except as mentioned in HPI   Objective  Filed Vitals:   09/29/15 1337  BP: 128/82  Pulse: 76  Temp: 98.3 F (36.8 C)  TempSrc: Oral  Resp: 16  Height: 5\' 8"  (1.727 m)  Weight: 240 lb 4.8 oz (108.999 kg)  SpO2: 96%    Body mass index is 36.55 kg/(m^2).  Physical Exam  Constitutional: Patient appears well-developed and well-nourished. Obese No distress.  HEENT: head atraumatic, normocephalic, pupils equal and reactive to light, ears TM,neck supple, throat within normal limits. Oral mucosa is dry, no oral lesions Cardiovascular: Normal rate, regular rhythm and normal heart sounds.  No murmur heard. No BLE edema. Pulmonary/Chest: Effort normal and breath sounds normal. No respiratory distress. Abdominal: Soft.  There is no tenderness. Psychiatric: Patient has a normal mood and affect. behavior is normal. Judgment and thought content normal. Asking to have multiple tests done , discussed health anxiety  Neuro: Romberg negative, normal grip, nystagmus negative  PHQ2/9: Depression  screen [facility] 2/9 09/29/2015 08/06/2015 04/16/2015  Decreased Interest 0 0 0  Down, Depressed, Hopeless 0 1 1  PHQ - 2 Score 0 1 1    Fall Risk: Fall Risk  09/29/2015 08/06/2015 04/16/2015  Falls in the past year? No No Yes  Number falls in past yr: - - 2 or more  Injury with Fall? - - No    Functional Status Survey: Is the patient deaf or have difficulty hearing?: Yes (patient cannot hear out of her right ear) Does the patient have difficulty seeing, even when wearing glasses/contacts?: No Does the patient have difficulty concentrating, remembering, or making decisions?: Yes (patient stated that she is not sure if it is due to age or lupus.) Does the patient have difficulty walking or climbing stairs?: No Does the patient have difficulty dressing or bathing?: No Does the patient have difficulty doing errands alone such as visiting a doctor's office or shopping?: No   Assessment & Plan  1. Irritation of oral cavity  - Diphenhyd-Hydrocort-Nystatin (FIRST-DUKES MOUTHWASH) SUSP; Use as directed 15 mLs in the mouth or throat 4 (four) times daily -  before meals and at bedtime.  Dispense: 237 mL; Refill: 0  2. Cough  Likely post-bronchial , keep CT chest for the pulmonary nodules  3. Tongue burning sensation  Mouth is dry, but no lesions, we will try Dukes mouthwash - Diphenhyd-Hydrocort-Nystatin (FIRST-DUKES MOUTHWASH) SUSP; Use as directed 15 mLs in the mouth or throat 4 (four) times daily -  before meals and at bedtime.  Dispense: 237 mL; Refill: 0  4. Chronic nausea  - ondansetron (ZOFRAN) 4 MG tablet; Take 1 tablet (4 mg total) by mouth every 8 (eight) hours as needed for nausea or vomiting.  Dispense: 180 tablet; Refill: 0  5. Vertigo  - meclizine (ANTIVERT) 25 MG tablet; Take 1 tablet (25 mg total) by mouth 3 (three) times daily as needed.  Dispense: 30 tablet; Refill: 0  6. Anxiety about health  Trying to switch psychiatrist, stills talks to counselor monthly at the pain  clinic

## 2015-09-30 DIAGNOSIS — M797 Fibromyalgia: Secondary | ICD-10-CM | POA: Diagnosis not present

## 2015-09-30 DIAGNOSIS — M545 Low back pain: Secondary | ICD-10-CM | POA: Diagnosis not present

## 2015-09-30 DIAGNOSIS — G8929 Other chronic pain: Secondary | ICD-10-CM | POA: Diagnosis not present

## 2015-10-02 DIAGNOSIS — R0602 Shortness of breath: Secondary | ICD-10-CM | POA: Diagnosis not present

## 2015-10-02 DIAGNOSIS — J929 Pleural plaque without asbestos: Secondary | ICD-10-CM | POA: Diagnosis not present

## 2015-10-02 DIAGNOSIS — M329 Systemic lupus erythematosus, unspecified: Secondary | ICD-10-CM | POA: Diagnosis not present

## 2015-10-02 DIAGNOSIS — R918 Other nonspecific abnormal finding of lung field: Secondary | ICD-10-CM | POA: Diagnosis not present

## 2015-10-12 DIAGNOSIS — M329 Systemic lupus erythematosus, unspecified: Secondary | ICD-10-CM | POA: Diagnosis not present

## 2015-10-12 DIAGNOSIS — M545 Low back pain: Secondary | ICD-10-CM | POA: Diagnosis not present

## 2015-10-12 DIAGNOSIS — G8929 Other chronic pain: Secondary | ICD-10-CM | POA: Diagnosis not present

## 2015-10-12 DIAGNOSIS — L930 Discoid lupus erythematosus: Secondary | ICD-10-CM | POA: Diagnosis not present

## 2015-10-12 DIAGNOSIS — M4806 Spinal stenosis, lumbar region: Secondary | ICD-10-CM | POA: Diagnosis not present

## 2015-10-12 DIAGNOSIS — M1990 Unspecified osteoarthritis, unspecified site: Secondary | ICD-10-CM | POA: Diagnosis not present

## 2015-10-12 DIAGNOSIS — G894 Chronic pain syndrome: Secondary | ICD-10-CM | POA: Diagnosis not present

## 2015-10-12 DIAGNOSIS — M797 Fibromyalgia: Secondary | ICD-10-CM | POA: Diagnosis not present

## 2015-10-12 DIAGNOSIS — Z96651 Presence of right artificial knee joint: Secondary | ICD-10-CM | POA: Diagnosis not present

## 2015-10-13 ENCOUNTER — Ambulatory Visit (INDEPENDENT_AMBULATORY_CARE_PROVIDER_SITE_OTHER): Payer: Medicare Other

## 2015-10-13 ENCOUNTER — Telehealth: Payer: Self-pay

## 2015-10-13 DIAGNOSIS — Z23 Encounter for immunization: Secondary | ICD-10-CM | POA: Diagnosis not present

## 2015-10-13 NOTE — Telephone Encounter (Signed)
Patient came in to get her 2nd hep injection and mention about some chest discomfort. She stated that she recently had a ct of her lungs at [facility], Roselyn Reef looked at the impression and it did not say anything about her heart, so patient was advised to monitor her sx and to come in for an office visit if her sx persists or go to the nearest ER. She agreed and said thanks.

## 2015-10-14 ENCOUNTER — Telehealth: Payer: Self-pay | Admitting: Family Medicine

## 2015-10-14 DIAGNOSIS — M797 Fibromyalgia: Secondary | ICD-10-CM | POA: Diagnosis not present

## 2015-10-14 DIAGNOSIS — G8929 Other chronic pain: Secondary | ICD-10-CM | POA: Diagnosis not present

## 2015-10-14 DIAGNOSIS — M5136 Other intervertebral disc degeneration, lumbar region: Secondary | ICD-10-CM | POA: Diagnosis not present

## 2015-10-14 DIAGNOSIS — L930 Discoid lupus erythematosus: Secondary | ICD-10-CM | POA: Diagnosis not present

## 2015-10-14 DIAGNOSIS — M47817 Spondylosis without myelopathy or radiculopathy, lumbosacral region: Secondary | ICD-10-CM | POA: Diagnosis not present

## 2015-10-14 DIAGNOSIS — R682 Dry mouth, unspecified: Secondary | ICD-10-CM

## 2015-10-14 DIAGNOSIS — M545 Low back pain: Secondary | ICD-10-CM | POA: Diagnosis not present

## 2015-10-14 DIAGNOSIS — M4806 Spinal stenosis, lumbar region: Secondary | ICD-10-CM | POA: Diagnosis not present

## 2015-10-14 NOTE — Telephone Encounter (Signed)
Patient mentioned to you on her visit about the burning sensation on her tongue. She went to the dentist and he is running lab work, but is requesting that you do additional testing. He is suggesting that you order:  SS-A; SS-B; ANA; RF (Checking for Secondary Sjogren Syndrome).  B9; B12 (Checking for Vitamin Def)

## 2015-10-15 NOTE — Telephone Encounter (Signed)
ordered

## 2015-10-16 ENCOUNTER — Other Ambulatory Visit: Payer: Self-pay | Admitting: Family Medicine

## 2015-10-16 DIAGNOSIS — R682 Dry mouth, unspecified: Secondary | ICD-10-CM | POA: Diagnosis not present

## 2015-10-19 DIAGNOSIS — G8929 Other chronic pain: Secondary | ICD-10-CM | POA: Diagnosis not present

## 2015-10-19 DIAGNOSIS — M797 Fibromyalgia: Secondary | ICD-10-CM | POA: Diagnosis not present

## 2015-10-19 DIAGNOSIS — M545 Low back pain: Secondary | ICD-10-CM | POA: Diagnosis not present

## 2015-10-20 ENCOUNTER — Telehealth: Payer: Self-pay | Admitting: Family Medicine

## 2015-10-20 LAB — SJOGREN'S SYNDROME ANTIBODS(SSA + SSB): ENA SSB (LA) Ab: <0.2 AI (ref 0.0–0.9)

## 2015-10-20 LAB — VITAMIN B6: Vitamin B6: 17.1 ug/L (ref 2.0–32.8)

## 2015-10-20 LAB — VITAMIN B12: VITAMIN B 12: 872 pg/mL (ref 211–946)

## 2015-10-20 NOTE — Telephone Encounter (Signed)
Patient is not asking for any records to be sent to her dentist she will tell them herself that the bloodwork was normal.

## 2015-10-20 NOTE — Telephone Encounter (Signed)
Although Dr. Ancil Boozer stated to have results faxed to this patient's dentist (because they requested them), no information was faxed out due to patient's request.

## 2015-10-21 DIAGNOSIS — K5900 Constipation, unspecified: Secondary | ICD-10-CM | POA: Diagnosis not present

## 2015-11-05 DIAGNOSIS — D353 Benign neoplasm of craniopharyngeal duct: Secondary | ICD-10-CM | POA: Diagnosis not present

## 2015-11-05 DIAGNOSIS — I129 Hypertensive chronic kidney disease with stage 1 through stage 4 chronic kidney disease, or unspecified chronic kidney disease: Secondary | ICD-10-CM | POA: Diagnosis not present

## 2015-11-05 DIAGNOSIS — Z1239 Encounter for other screening for malignant neoplasm of breast: Secondary | ICD-10-CM | POA: Diagnosis not present

## 2015-11-05 DIAGNOSIS — R928 Other abnormal and inconclusive findings on diagnostic imaging of breast: Secondary | ICD-10-CM | POA: Diagnosis not present

## 2015-11-05 DIAGNOSIS — K219 Gastro-esophageal reflux disease without esophagitis: Secondary | ICD-10-CM | POA: Diagnosis not present

## 2015-11-05 DIAGNOSIS — N181 Chronic kidney disease, stage 1: Secondary | ICD-10-CM | POA: Diagnosis not present

## 2015-11-05 DIAGNOSIS — Z803 Family history of malignant neoplasm of breast: Secondary | ICD-10-CM | POA: Diagnosis not present

## 2015-11-05 DIAGNOSIS — D352 Benign neoplasm of pituitary gland: Secondary | ICD-10-CM | POA: Diagnosis not present

## 2015-11-05 DIAGNOSIS — M797 Fibromyalgia: Secondary | ICD-10-CM | POA: Diagnosis not present

## 2015-11-11 DIAGNOSIS — D892 Hypergammaglobulinemia, unspecified: Secondary | ICD-10-CM | POA: Diagnosis not present

## 2015-11-11 DIAGNOSIS — L930 Discoid lupus erythematosus: Secondary | ICD-10-CM | POA: Diagnosis not present

## 2015-11-11 DIAGNOSIS — M1990 Unspecified osteoarthritis, unspecified site: Secondary | ICD-10-CM | POA: Diagnosis not present

## 2015-11-11 DIAGNOSIS — M797 Fibromyalgia: Secondary | ICD-10-CM | POA: Diagnosis not present

## 2015-11-11 DIAGNOSIS — Z79899 Other long term (current) drug therapy: Secondary | ICD-10-CM | POA: Diagnosis not present

## 2015-11-11 DIAGNOSIS — M329 Systemic lupus erythematosus, unspecified: Secondary | ICD-10-CM | POA: Diagnosis not present

## 2015-11-25 ENCOUNTER — Other Ambulatory Visit: Payer: Self-pay | Admitting: Family Medicine

## 2015-11-25 DIAGNOSIS — R5383 Other fatigue: Secondary | ICD-10-CM | POA: Diagnosis not present

## 2015-11-25 DIAGNOSIS — R05 Cough: Secondary | ICD-10-CM | POA: Diagnosis not present

## 2015-11-25 DIAGNOSIS — I10 Essential (primary) hypertension: Secondary | ICD-10-CM | POA: Diagnosis not present

## 2015-11-25 DIAGNOSIS — G4733 Obstructive sleep apnea (adult) (pediatric): Secondary | ICD-10-CM | POA: Diagnosis not present

## 2015-11-25 DIAGNOSIS — G479 Sleep disorder, unspecified: Secondary | ICD-10-CM | POA: Diagnosis not present

## 2015-11-25 NOTE — Telephone Encounter (Signed)
Patient requesting refill. 

## 2015-11-30 DIAGNOSIS — I129 Hypertensive chronic kidney disease with stage 1 through stage 4 chronic kidney disease, or unspecified chronic kidney disease: Secondary | ICD-10-CM | POA: Diagnosis not present

## 2015-11-30 DIAGNOSIS — L930 Discoid lupus erythematosus: Secondary | ICD-10-CM | POA: Diagnosis not present

## 2015-11-30 DIAGNOSIS — R809 Proteinuria, unspecified: Secondary | ICD-10-CM | POA: Diagnosis not present

## 2015-11-30 DIAGNOSIS — N181 Chronic kidney disease, stage 1: Secondary | ICD-10-CM | POA: Diagnosis not present

## 2015-12-07 DIAGNOSIS — M4806 Spinal stenosis, lumbar region: Secondary | ICD-10-CM | POA: Diagnosis not present

## 2015-12-07 DIAGNOSIS — M542 Cervicalgia: Secondary | ICD-10-CM | POA: Diagnosis not present

## 2015-12-07 DIAGNOSIS — G894 Chronic pain syndrome: Secondary | ICD-10-CM | POA: Diagnosis not present

## 2015-12-07 DIAGNOSIS — M329 Systemic lupus erythematosus, unspecified: Secondary | ICD-10-CM | POA: Diagnosis not present

## 2015-12-07 DIAGNOSIS — M797 Fibromyalgia: Secondary | ICD-10-CM | POA: Diagnosis not present

## 2015-12-15 ENCOUNTER — Encounter: Payer: Self-pay | Admitting: Family Medicine

## 2015-12-15 ENCOUNTER — Ambulatory Visit (INDEPENDENT_AMBULATORY_CARE_PROVIDER_SITE_OTHER): Payer: Medicare Other | Admitting: Family Medicine

## 2015-12-15 VITALS — BP 116/68 | HR 88 | Temp 98.3°F | Resp 16 | Wt 241.0 lb

## 2015-12-15 DIAGNOSIS — M542 Cervicalgia: Secondary | ICD-10-CM | POA: Insufficient documentation

## 2015-12-15 MED ORDER — CYCLOBENZAPRINE HCL 5 MG PO TABS: 5.0000 mg | ORAL_TABLET | Freq: Three times a day (TID) | ORAL | Status: DC | PRN

## 2015-12-15 NOTE — Progress Notes (Signed)
BP 116/68 mmHg  Pulse 88  Temp(Src) 98.3 F (36.8 C) (Oral)  Resp 16  Wt 241 lb (109.317 kg)  SpO2 97%   Subjective:    Patient ID: Nicole Ballard Dates, female    DOB: [birth year], 58 y.o.   MRN: [id number]  HPI: [name] is a 60 y.o. female  Chief Complaint  Patient presents with  . Neck Pain    onset several weeks bilateral sides radiating down to shoulders.  Patient states sometimes it a burning sensation with achiness.   She is having pain on both sides of the neck; started 2 weeks ago; no worse over those last 2 weeks; stays steady for the most part, sometimes with sharper stronger pain on both sides of the neck laterally No numbness in the arms; no pain in the hips She has been taking tylenol; her kidney doctor won't let her take any NSAIDs She has been clearing her throat frequently Has hx of burning in the mouth Intense headaches, 2-3 weeks ago; has migraines, but this is persistent; some nausea; no vomiting; headache woke her from sleep; not just in the morning Having some nasal pain Not sure about swollen glands; ears are bothering her She has fibromyalgia and lupus, sees Dr. Kathleen Argue at [facility] She has been taking losartan/hctz  Relevant past medical, surgical, family and social history reviewed and updated as indicated Past Medical History  Diagnosis Date  . Fibromyalgia   . Paresthesia   . GERD (gastroesophageal reflux disease)   . Rhinitis   . Systemic lupus erythematosus ([place])   . Hemifacial spasm   . Chronic kidney disease   . Chronic pain   . Anxiety   . Vitamin D deficiency   . Bipolar 1 disorder ([place])   . Hypertension   . Osteoarthritis   . Lumbago   . Insomnia   . Hyperlipidemia   . Hearing loss of right ear    Past Surgical History  Procedure Laterality Date  . Cholecystectomy    . Vaginal hysterectomy    . Brain surgery     Family History  Problem Relation Age of Onset  . Breast cancer Mother   . Cancer Mother 52    Breast  .  Cancer Father 66    Stomach  . Breast cancer Maternal Aunt    Social History  Substance Use Topics  . Smoking status: Never Smoker   . Smokeless tobacco: Never Used  . Alcohol Use: No   Interim medical history since last visit reviewed. Allergies and medications reviewed and updated.  Review of Systems Per HPI unless specifically indicated above     Objective:    BP 116/68 mmHg  Pulse 88  Temp(Src) 98.3 F (36.8 C) (Oral)  Resp 16  Wt 241 lb (109.317 kg)  SpO2 97%  Wt Readings from Last 3 Encounters:  12/30/15 242 lb 3.2 oz (109.861 kg)  12/15/15 241 lb (109.317 kg)  09/29/15 240 lb 4.8 oz (108.999 kg)    Physical Exam  Constitutional: She appears well-developed and well-nourished.  Obese  HENT:  Head: Normocephalic and atraumatic.  Right Ear: Tympanic membrane and ear canal normal.  Left Ear: Tympanic membrane and ear canal normal.  Nose: No rhinorrhea.  Mouth/Throat: Mucous membranes are normal. No oral lesions. Normal dentition. No oropharyngeal exudate, posterior oropharyngeal edema or posterior oropharyngeal erythema.  Cardiovascular: Normal rate and regular rhythm.   Pulmonary/Chest: Effort normal and breath sounds normal.  Musculoskeletal:  Cervical back: She exhibits tenderness. She exhibits normal range of motion, no swelling, no edema, no deformity and no spasm.  Lymphadenopathy:    She has no cervical adenopathy.  Psychiatric: She has a normal mood and affect.      Assessment & Plan:   Problem List Items Addressed This Visit      Other   Neck pain, musculoskeletal - Primary    Suspect musculoskeletal; I do not detect cervical lymphadenopathy; will try stretching, referral to physical therapy; may use muscle relaxant; cautioned her it may cause somnolence or feelings of loopiness and to not drive for 8 hours after taking; if not improving, call or return to clinic, consider imaging, further work-up, but right now there are no signs of  radiculopathy or red flags      Relevant Orders   Ambulatory referral to Physical Therapy      Follow up plan: Return in about 1 month (around 01/14/2016), or to see Dr. Ancil Boozer, for visit and fasting labs. An after-visit summary was printed and given to the patient at [facility].  Please see the patient instructions which may contain other information and recommendations beyond what is mentioned above in the assessment and plan. Meds ordered this encounter  Medications  . cyclobenzaprine (FLEXERIL) 5 MG tablet    Sig: Take 1 tablet (5 mg total) by mouth every 8 (eight) hours as needed for muscle spasms. Do not drive for 8 hours    Dispense:  21 tablet    Refill:  0

## 2015-12-15 NOTE — Patient Instructions (Signed)
You can use the cyclobenzaprine if needed for muscle tightness; just do not drive for eight hours after taking, as it may make you feel loopy or a little drunk Feel free to schedule a physical therapy appointment If your symptoms worsen, please do call me Return to see Dr. Ancil Boozer in the next month for prediabetes and cholesterol, etc.; come fasting for labs

## 2015-12-28 DIAGNOSIS — D353 Benign neoplasm of craniopharyngeal duct: Secondary | ICD-10-CM | POA: Diagnosis not present

## 2015-12-28 DIAGNOSIS — D352 Benign neoplasm of pituitary gland: Secondary | ICD-10-CM | POA: Diagnosis not present

## 2015-12-28 DIAGNOSIS — Z803 Family history of malignant neoplasm of breast: Secondary | ICD-10-CM | POA: Diagnosis not present

## 2015-12-30 ENCOUNTER — Encounter: Payer: Self-pay | Admitting: Family Medicine

## 2015-12-30 ENCOUNTER — Ambulatory Visit (INDEPENDENT_AMBULATORY_CARE_PROVIDER_SITE_OTHER): Payer: Medicare Other | Admitting: Family Medicine

## 2015-12-30 VITALS — BP 112/70 | HR 85 | Temp 98.2°F | Resp 16 | Ht 68.0 in | Wt 242.2 lb

## 2015-12-30 DIAGNOSIS — I10 Essential (primary) hypertension: Secondary | ICD-10-CM | POA: Diagnosis not present

## 2015-12-30 DIAGNOSIS — H6123 Impacted cerumen, bilateral: Secondary | ICD-10-CM

## 2015-12-30 DIAGNOSIS — H938X2 Other specified disorders of left ear: Secondary | ICD-10-CM | POA: Diagnosis not present

## 2015-12-30 DIAGNOSIS — M542 Cervicalgia: Secondary | ICD-10-CM | POA: Diagnosis not present

## 2015-12-30 DIAGNOSIS — F411 Generalized anxiety disorder: Secondary | ICD-10-CM | POA: Diagnosis not present

## 2015-12-30 MED ORDER — LOSARTAN POTASSIUM-HCTZ 50-12.5 MG PO TABS: 1.0000 | ORAL_TABLET | Freq: Every day | ORAL | Status: DC

## 2015-12-30 NOTE — Progress Notes (Signed)
Name: Nicole Ballard   MRN: [id number]    DOB: 01-24-1956   Date:12/30/2015       Progress Note  Subjective  Chief Complaint  Chief Complaint  Patient presents with  . Neck Pain    patient presents with bilateral neck pain with soreness.  . Ear Fullness    patient stated that now she feel like she has water in her ear with some dizziness.  . Other    patient has paperwork that needs to be completed for [facility]    HPI  Neck pain: patient states that she bilateral neck pain, described as soreness and sometimes sharp sensation on both side of her neck, worse with movement, better with massage. Only change was that she got an Ipad for Christmas and looks down when playing games. Also looks down when studying her bible. No change in pillows or mattress. Seen by Dr. Sanda Klein two weeks ago and was given Flexeril for muscle spasms and advised to see PT. She did not schedule PT, but states medication improved symptoms slightly.   Ear Fullness: going on for the past week, no pain, no change in hearing loss - still can't hear from right side. She has chronic symptoms of vertigo.  GAD: she is unable to stay home alone when having severe anxiety. She needs a 2 BR apartment to have someone with her when very anxious. Still sees psychiatrist.   High protein level: chronic , but slightly higher when checked by Rheumatologist, she also sees Nephrologist, may need to see hematologist if it goes higher.    Patient Active Problem List   Diagnosis Date Noted  . Neck pain, musculoskeletal 12/15/2015  . Anxiety about health 09/29/2015  . Cough 09/11/2015  . Abnormal CAT scan 04/11/2015  . Auditory impairment 04/11/2015  . Insomnia, persistent 04/11/2015  . Chronic kidney disease (CKD), stage I 04/11/2015  . Chronic nonmalignant pain 04/11/2015  . Chronic constipation 04/11/2015  . Dyslipidemia 04/11/2015  . Fibromyalgia 04/11/2015  . Gastro-esophageal reflux disease without esophagitis 04/11/2015  .  Dysphonia 04/11/2015  . Absence of bladder continence 04/11/2015  . LBP (low back pain) 04/11/2015  . Eczema intertrigo 04/11/2015  . Keratosis pilaris 04/11/2015  . Chronic recurrent major depressive disorder ([place]) 04/11/2015  . Dysmetabolic syndrome 123456  . Neurogenic claudication 04/11/2015  . Perennial allergic rhinitis with seasonal variation 04/11/2015  . Abnormal presence of protein in urine 04/11/2015  . Seborrhea capitis 04/11/2015  . Moderate dysplasia of vulva 04/11/2015  . Vitamin D deficiency 04/11/2015  . Treadmill stress test negative for angina pectoris 03/05/2015  . Shortness of breath 05/20/2014  . Morbid obesity ([place]) 02/03/2014  . Asthma, chronic 12/31/2013  . H/O total knee replacement 12/31/2013  . Episcleritis 09/26/2013  . Vocal cord dysfunction 05/28/2013  . Anxiety, generalized 03/19/2013  . Back pain 12/18/2012  . Pulmonary nodules 11/26/2012  . Lung nodule, multiple 11/26/2012  . Arthritis, degenerative 11/01/2012  . H/O aspiration pneumonitis 10/22/2012  . Postinflammatory pulmonary fibrosis ([place]) 10/22/2012  . Mixed incontinence 05/04/2012  . Benign neoplasm of pituitary gland and craniopharyngeal duct ([place]) 12/29/2010  . Lupus (systemic lupus erythematosus) ([place]) 11/05/2010  . Chronic nausea 07/01/2008  . Benign hypertension 01/25/2005    Past Surgical History  Procedure Laterality Date  . Cholecystectomy    . Vaginal hysterectomy    . Brain surgery      Family History  Problem Relation Age of Onset  . Breast cancer Mother   . Cancer Mother 59  Breast  . Cancer Father 40    Stomach  . Breast cancer Maternal Aunt     Social History   Social History  . Marital Status: Divorced    Spouse Name: N/A  . Number of Children: N/A  . Years of Education: N/A   Occupational History  . Not on file.   Social History Main Topics  . Smoking status: Never Smoker   . Smokeless tobacco: Never Used  . Alcohol Use: No  . Drug Use: No   . Sexual Activity: Not Currently   Other Topics Concern  . Not on file   Social History Narrative     Current outpatient prescriptions:  .  albuterol (PROAIR HFA) 108 (90 BASE) MCG/ACT inhaler, Inhale 2 puffs into the lungs every 6 (six) hours as needed for wheezing. DX code R06.02, Disp: 3 Inhaler, Rfl: 3 .  Cholecalciferol (VITAMIN D) 2000 UNITS CAPS, Take 1 capsule by mouth daily., Disp: , Rfl:  .  clonazePAM (KLONOPIN) 0.5 MG tablet, , Disp: , Rfl: 2 .  cyclobenzaprine (FLEXERIL) 5 MG tablet, Take 1 tablet (5 mg total) by mouth every 8 (eight) hours as needed for muscle spasms. Do not drive for 8 hours, Disp: 21 tablet, Rfl: 0 .  diclofenac sodium (VOLTAREN) 1 % GEL, APP 2 GRAMS EXT AA QID, Disp: , Rfl: 6 .  docusate sodium (COLACE) 100 MG capsule, Take 2 capsules (200 mg total) by mouth 2 (two) times daily., Disp: 120 capsule, Rfl: 0 .  DULoxetine (CYMBALTA) 60 MG capsule, Take 120 mg by mouth at bedtime. , Disp: , Rfl:  .  FLOVENT HFA 110 MCG/ACT inhaler, INHALE 2 PUFFS PO BID, Disp: , Rfl: 0 .  fluticasone (FLONASE) 50 MCG/ACT nasal spray, Use 2 sprays in each  nostril daily, Disp: 48 g, Rfl: 0 .  gabapentin (NEURONTIN) 300 MG capsule, Take 300 mg by mouth., Disp: , Rfl:  .  Glycerin, Laxative, (RA GLYCERIN ADULT) 80.7 % SUPP, Place rectally., Disp: , Rfl:  .  hydroxychloroquine (PLAQUENIL) 200 MG tablet, Take 200 mg by mouth 2 (two) times daily., Disp: , Rfl:  .  lamoTRIgine (LAMICTAL) 100 MG tablet, Take 1 tablet by mouth 3 (three) times daily., Disp: , Rfl:  .  losartan-hydrochlorothiazide (HYZAAR) 50-12.5 MG tablet, Take 1 tablet by mouth daily., Disp: 30 tablet, Rfl: 5 .  meclizine (ANTIVERT) 25 MG tablet, Take 1 tablet (25 mg total) by mouth 3 (three) times daily as needed., Disp: 30 tablet, Rfl: 0 .  Multiple Vitamin (MULTIVITAMIN) tablet, Take 1 tablet by mouth daily., Disp: , Rfl:  .  omeprazole (PRILOSEC) 40 MG capsule, Take 1 capsule (40 mg total) by mouth daily., Disp:  180 capsule, Rfl: 3 .  ondansetron (ZOFRAN) 4 MG tablet, Take 1 tablet (4 mg total) by mouth every 8 (eight) hours as needed for nausea or vomiting., Disp: 180 tablet, Rfl: 0 .  polyethylene glycol powder (GLYCOLAX/MIRALAX) powder, 2 cap fulls in a full glass of water, three times a day, for 5 days. (Patient not taking: Reported on 08/06/2015), Disp: 255 g, Rfl: 0 .  traZODone (DESYREL) 150 MG tablet, Take 150 mg by mouth at bedtime., Disp: , Rfl:   Allergies  Allergen Reactions  . Ace Inhibitors     Other reaction(s): OTHER Pt states she can not take ace inhibitors. Pt states she can not remember her reaction.   Marland Kitchen Penicillins      ROS  Ten systems reviewed and is negative except as  mentioned in HPI   Objective  Filed Vitals:   12/30/15 1053  BP: 112/70  Pulse: 85  Temp: 98.2 F (36.8 C)  TempSrc: Oral  Resp: 16  Height: 5\' 8"  (1.727 m)  Weight: 242 lb 3.2 oz (109.861 kg)  SpO2: 97%    Body mass index is 36.83 kg/(m^2).  Physical Exam  Constitutional: Patient appears well-developed and well-nourished. Obese No distress.  HEENT: head atraumatic, normocephalic, pupils equal and reactive to light, ears wax on both ear canals,  neck supple, throat within normal limits Cardiovascular: Normal rate, regular rhythm and normal heart sounds.  No murmur heard. No BLE edema. Pulmonary/Chest: Effort normal and breath sounds normal. No respiratory distress. Abdominal: Soft.  There is no tenderness. Psychiatric: Patient is always anxious, very concerned about her health,. behavior is normal. Judgment and thought content normal. Muscular Skeletal: pain during palpation of both sternocleidomastoid.   Recent Results (from the past 2160 hour(s))  Sjogren's syndrome antibods(ssa + ssb)     Status: None   Collection Time: 10/16/15  2:04 PM  Result Value Ref Range   ENA SSA (RO) Ab <0.2 0.0 - 0.9 AI   ENA SSB (LA) Ab <0.2 0.0 - 0.9 AI  Vitamin B6     Status: None   Collection Time:  10/16/15  2:04 PM  Result Value Ref Range   Vitamin B6 17.1 2.0 - 32.8 ug/L  Vitamin B12     Status: None   Collection Time: 10/16/15  2:04 PM  Result Value Ref Range   Vitamin B-12 872 211 - 946 pg/mL     PHQ2/9: Depression screen [facility] 2/9 12/30/2015 12/15/2015 09/29/2015 08/06/2015 04/16/2015  Decreased Interest 0 0 0 0 0  Down, Depressed, Hopeless 0 0 0 1 1  PHQ - 2 Score 0 0 0 1 1     Fall Risk: Fall Risk  12/30/2015 12/15/2015 09/29/2015 08/06/2015 04/16/2015  Falls in the past year? No No No No Yes  Number falls in past yr: - - - - 2 or more  Injury with Fall? - - - - No      Functional Status Survey: Is the patient deaf or have difficulty hearing?: No Does the patient have difficulty seeing, even when wearing glasses/contacts?: Yes Does the patient have difficulty concentrating, remembering, or making decisions?: No Does the patient have difficulty walking or climbing stairs?: No Does the patient have difficulty dressing or bathing?: No Does the patient have difficulty doing errands alone such as visiting a doctor's office or shopping?: No    Assessment & Plan  1. Neck pain, musculoskeletal  Needs PT but she states the co-pay is too high, try not to look down on screen or when reading  2. Ear fullness, left  Likely from wax  3. Anxiety, generalized  Forms filled out -referral psychiatrist  4. Cerumen impaction, bilateral  - Ear Lavage  5. Benign hypertension  Dose adjusted by nephrologist but still taking 100/25, we will change prescriptions and monitor bp - losartan-hydrochlorothiazide (HYZAAR) 50-12.5 MG tablet; Take 1 tablet by mouth daily.  Dispense: 30 tablet; Refill: 5

## 2015-12-31 ENCOUNTER — Telehealth: Payer: Self-pay

## 2015-12-31 DIAGNOSIS — N9089 Other specified noninflammatory disorders of vulva and perineum: Secondary | ICD-10-CM | POA: Diagnosis not present

## 2015-12-31 DIAGNOSIS — N952 Postmenopausal atrophic vaginitis: Secondary | ICD-10-CM | POA: Diagnosis not present

## 2015-12-31 NOTE — Telephone Encounter (Signed)
Dr. Marjory Lies returned my call and asked taht we fax over her demographic information and last note. The requested information was faxed to Dr. Marjory Lies at # [phone]. Confirmation was received.

## 2015-12-31 NOTE — Telephone Encounter (Signed)
Nicole Ballard was not covered by this patient's insurance so they recommended Dr. Jacqualine Code.  Tried to contact Dr. Jacqualine Code with [facility] to scheduled this patient an appt but there was no answer. A message was left for them to contact our office when they got the chance.

## 2016-01-03 NOTE — Assessment & Plan Note (Signed)
Suspect musculoskeletal; I do not detect cervical lymphadenopathy; will try stretching, referral to physical therapy; may use muscle relaxant; cautioned her it may cause somnolence or feelings of loopiness and to not drive for 8 hours after taking; if not improving, call or return to clinic, consider imaging, further work-up, but right now there are no signs of radiculopathy or red flags

## 2016-01-14 ENCOUNTER — Ambulatory Visit: Payer: Medicare Other | Admitting: Family Medicine

## 2016-01-15 DIAGNOSIS — Z79899 Other long term (current) drug therapy: Secondary | ICD-10-CM | POA: Diagnosis not present

## 2016-01-15 DIAGNOSIS — K5900 Constipation, unspecified: Secondary | ICD-10-CM | POA: Diagnosis not present

## 2016-01-19 DIAGNOSIS — F411 Generalized anxiety disorder: Secondary | ICD-10-CM | POA: Diagnosis not present

## 2016-01-19 DIAGNOSIS — R700 Elevated erythrocyte sedimentation rate: Secondary | ICD-10-CM | POA: Diagnosis not present

## 2016-01-19 DIAGNOSIS — N181 Chronic kidney disease, stage 1: Secondary | ICD-10-CM | POA: Diagnosis not present

## 2016-01-21 ENCOUNTER — Encounter: Payer: Self-pay | Admitting: Family Medicine

## 2016-01-21 DIAGNOSIS — M79641 Pain in right hand: Secondary | ICD-10-CM | POA: Diagnosis not present

## 2016-01-21 DIAGNOSIS — M79642 Pain in left hand: Secondary | ICD-10-CM | POA: Diagnosis not present

## 2016-01-28 ENCOUNTER — Ambulatory Visit (INDEPENDENT_AMBULATORY_CARE_PROVIDER_SITE_OTHER): Payer: Medicare Other | Admitting: Family Medicine

## 2016-01-28 ENCOUNTER — Encounter: Payer: Self-pay | Admitting: Family Medicine

## 2016-01-28 VITALS — HR 98 | Temp 98.4°F | Resp 16 | Ht 68.0 in | Wt 239.9 lb

## 2016-01-28 DIAGNOSIS — M542 Cervicalgia: Secondary | ICD-10-CM

## 2016-01-28 DIAGNOSIS — I10 Essential (primary) hypertension: Secondary | ICD-10-CM | POA: Diagnosis not present

## 2016-01-28 NOTE — Progress Notes (Signed)
Name: Nicole Ballard   MRN: [id number]    DOB: 1956/06/12   Date:01/28/2016       Progress Note  Subjective  Chief Complaint  Chief Complaint  Patient presents with  . Neck Pain    patient is here for her 1-mont f/u. patient stated that she is doing good.    HPI  HTN: on lower dose of Hyzaar /hctz and bp is at goal, no chest pain or palpitation  Neck pain: she was seen one month ago, she was having daily neck pain, and was referred to PT and also advised to take muscle relaxer. He did home PT and is doing much better now, no pain at this time. Normal rom. No tingling on hands.   Patient Active Problem List   Diagnosis Date Noted  . Neck pain, musculoskeletal 12/15/2015  . Anxiety about health 09/29/2015  . Cough 09/11/2015  . Abnormal CAT scan 04/11/2015  . Auditory impairment 04/11/2015  . Insomnia, persistent 04/11/2015  . Chronic kidney disease (CKD), stage I 04/11/2015  . Chronic nonmalignant pain 04/11/2015  . Chronic constipation 04/11/2015  . Dyslipidemia 04/11/2015  . Fibromyalgia 04/11/2015  . Gastro-esophageal reflux disease without esophagitis 04/11/2015  . Dysphonia 04/11/2015  . Absence of bladder continence 04/11/2015  . LBP (low back pain) 04/11/2015  . Eczema intertrigo 04/11/2015  . Keratosis pilaris 04/11/2015  . Chronic recurrent major depressive disorder ([place]) 04/11/2015  . Dysmetabolic syndrome 123456  . Neurogenic claudication 04/11/2015  . Perennial allergic rhinitis with seasonal variation 04/11/2015  . Abnormal presence of protein in urine 04/11/2015  . Seborrhea capitis 04/11/2015  . Moderate dysplasia of vulva 04/11/2015  . Vitamin D deficiency 04/11/2015  . Treadmill stress test negative for angina pectoris 03/05/2015  . Shortness of breath 05/20/2014  . Morbid obesity ([place]) 02/03/2014  . Asthma, chronic 12/31/2013  . H/O total knee replacement 12/31/2013  . Episcleritis 09/26/2013  . Vocal cord dysfunction 05/28/2013  . Anxiety,  generalized 03/19/2013  . Back pain 12/18/2012  . Pulmonary nodules 11/26/2012  . Lung nodule, multiple 11/26/2012  . Arthritis, degenerative 11/01/2012  . H/O aspiration pneumonitis 10/22/2012  . Postinflammatory pulmonary fibrosis ([place]) 10/22/2012  . Mixed incontinence 05/04/2012  . Benign neoplasm of pituitary gland and craniopharyngeal duct ([place]) 12/29/2010  . Lupus (systemic lupus erythematosus) ([place]) 11/05/2010  . Chronic nausea 07/01/2008  . Benign hypertension 01/25/2005    Past Surgical History  Procedure Laterality Date  . Cholecystectomy    . Vaginal hysterectomy    . Brain surgery      Family History  Problem Relation Age of Onset  . Breast cancer Mother   . Cancer Mother 75    Breast  . Cancer Father 7    Stomach  . Breast cancer Maternal Aunt     Social History   Social History  . Marital Status: Divorced    Spouse Name: N/A  . Number of Children: N/A  . Years of Education: N/A   Occupational History  . Not on file.   Social History Main Topics  . Smoking status: Never Smoker   . Smokeless tobacco: Never Used  . Alcohol Use: No  . Drug Use: No  . Sexual Activity: Not Currently   Other Topics Concern  . Not on file   Social History Narrative     Current outpatient prescriptions:  .  Estradiol 10 MCG TABS vaginal tablet, Place 0.01 mg vaginally., Disp: , Rfl:  .  albuterol (PROAIR HFA) 108 (90 BASE)  MCG/ACT inhaler, Inhale 2 puffs into the lungs every 6 (six) hours as needed for wheezing. DX code R06.02, Disp: 3 Inhaler, Rfl: 3 .  Cholecalciferol (VITAMIN D) 2000 UNITS CAPS, Take 1 capsule by mouth daily., Disp: , Rfl:  .  clonazePAM (KLONOPIN) 0.5 MG tablet, , Disp: , Rfl: 2 .  cyclobenzaprine (FLEXERIL) 5 MG tablet, Take 1 tablet (5 mg total) by mouth every 8 (eight) hours as needed for muscle spasms. Do not drive for 8 hours, Disp: 21 tablet, Rfl: 0 .  diclofenac sodium (VOLTAREN) 1 % GEL, APP 2 GRAMS EXT AA QID, Disp: , Rfl: 6 .   docusate sodium (COLACE) 100 MG capsule, Take 2 capsules (200 mg total) by mouth 2 (two) times daily., Disp: 120 capsule, Rfl: 0 .  DULoxetine (CYMBALTA) 60 MG capsule, Take 120 mg by mouth at bedtime. , Disp: , Rfl:  .  FLOVENT HFA 110 MCG/ACT inhaler, INHALE 2 PUFFS PO BID, Disp: , Rfl: 0 .  fluticasone (FLONASE) 50 MCG/ACT nasal spray, Use 2 sprays in each  nostril daily, Disp: 48 g, Rfl: 0 .  gabapentin (NEURONTIN) 300 MG capsule, Take 300 mg by mouth., Disp: , Rfl:  .  Glycerin, Laxative, (RA GLYCERIN ADULT) 80.7 % SUPP, Place rectally., Disp: , Rfl:  .  hydroxychloroquine (PLAQUENIL) 200 MG tablet, Take 200 mg by mouth 2 (two) times daily., Disp: , Rfl:  .  lamoTRIgine (LAMICTAL) 100 MG tablet, Take 1 tablet by mouth 3 (three) times daily., Disp: , Rfl:  .  losartan-hydrochlorothiazide (HYZAAR) 50-12.5 MG tablet, Take 1 tablet by mouth daily., Disp: 30 tablet, Rfl: 5 .  meclizine (ANTIVERT) 25 MG tablet, Take 1 tablet (25 mg total) by mouth 3 (three) times daily as needed., Disp: 30 tablet, Rfl: 0 .  Multiple Vitamin (MULTIVITAMIN) tablet, Take 1 tablet by mouth daily., Disp: , Rfl:  .  omeprazole (PRILOSEC) 40 MG capsule, Take 1 capsule (40 mg total) by mouth daily., Disp: 180 capsule, Rfl: 3 .  ondansetron (ZOFRAN) 4 MG tablet, Take 1 tablet (4 mg total) by mouth every 8 (eight) hours as needed for nausea or vomiting., Disp: 180 tablet, Rfl: 0 .  polyethylene glycol powder (GLYCOLAX/MIRALAX) powder, 2 cap fulls in a full glass of water, three times a day, for 5 days. (Patient not taking: Reported on 08/06/2015), Disp: 255 g, Rfl: 0 .  polyethylene glycol-electrolytes (NULYTELY/GOLYTELY) 420 g solution, TK AS DIRECTED, Disp: , Rfl: 2 .  topiramate (TOPAMAX) 50 MG tablet, , Disp: , Rfl:  .  traZODone (DESYREL) 150 MG tablet, Take 150 mg by mouth at bedtime., Disp: , Rfl:   Allergies  Allergen Reactions  . Ace Inhibitors     Other reaction(s): OTHER Pt states she can not take ace  inhibitors. Pt states she can not remember her reaction.   Marland Kitchen Penicillins      ROS  Ten systems reviewed and is negative except as mentioned in HPI . Aches all over, right knee pain ( going to see Ortho ), fatigue, depression waiting for referral to psychiatrist  Objective  Filed Vitals:   01/28/16 0945  Pulse: 98  Temp: 98.4 F (36.9 C)  TempSrc: Oral  Resp: 16  Height: 5\' 8"  (1.727 m)  Weight: 239 lb 14.4 oz (108.818 kg)  SpO2: 98%    Body mass index is 36.49 kg/(m^2).  Physical Exam  Constitutional: Patient appears well-developed and well-nourished. Obese  No distress.  HEENT: head atraumatic, normocephalic, pupils equal and reactive to  light, ears normal TM bilaterally neck supple, throat within normal limits Cardiovascular: Normal rate, regular rhythm and normal heart sounds.  No murmur heard. No BLE edema. Pulmonary/Chest: Effort normal and breath sounds normal. No respiratory distress. Abdominal: Soft.  There is no tenderness. Psychiatric: Patient has a normal mood and affect. behavior is normal. Judgment and thought content normal.  PHQ2/9: Depression screen [facility] 2/9 01/28/2016 12/30/2015 12/15/2015 09/29/2015 08/06/2015  Decreased Interest 0 0 0 0 0  Down, Depressed, Hopeless 0 0 0 0 1  PHQ - 2 Score 0 0 0 0 1     Fall Risk: Fall Risk  01/28/2016 12/30/2015 12/15/2015 09/29/2015 08/06/2015  Falls in the past year? No No No No No  Number falls in past yr: - - - - -  Injury with Fall? - - - - -      Functional Status Survey: Is the patient deaf or have difficulty hearing?: No Does the patient have difficulty seeing, even when wearing glasses/contacts?: Yes Does the patient have difficulty concentrating, remembering, or making decisions?: No Does the patient have difficulty walking or climbing stairs?: No Does the patient have difficulty dressing or bathing?: No Does the patient have difficulty doing errands alone such as visiting a doctor's office or shopping?:  No    Assessment & Plan  1. Neck pain, musculoskeletal  Doing well at this time  2. Benign hypertension  Continue current dose of medication, no side effects, bp is at goal

## 2016-02-02 DIAGNOSIS — R05 Cough: Secondary | ICD-10-CM | POA: Diagnosis not present

## 2016-02-02 DIAGNOSIS — I10 Essential (primary) hypertension: Secondary | ICD-10-CM | POA: Diagnosis not present

## 2016-02-02 DIAGNOSIS — G4733 Obstructive sleep apnea (adult) (pediatric): Secondary | ICD-10-CM | POA: Diagnosis not present

## 2016-02-10 ENCOUNTER — Ambulatory Visit (INDEPENDENT_AMBULATORY_CARE_PROVIDER_SITE_OTHER): Payer: Medicare Other

## 2016-02-10 DIAGNOSIS — Z23 Encounter for immunization: Secondary | ICD-10-CM | POA: Diagnosis not present

## 2016-02-11 DIAGNOSIS — Z803 Family history of malignant neoplasm of breast: Secondary | ICD-10-CM | POA: Diagnosis not present

## 2016-02-11 DIAGNOSIS — N893 Dysplasia of vagina, unspecified: Secondary | ICD-10-CM | POA: Diagnosis not present

## 2016-02-11 DIAGNOSIS — N181 Chronic kidney disease, stage 1: Secondary | ICD-10-CM | POA: Diagnosis not present

## 2016-02-11 DIAGNOSIS — E669 Obesity, unspecified: Secondary | ICD-10-CM | POA: Diagnosis not present

## 2016-02-11 DIAGNOSIS — I129 Hypertensive chronic kidney disease with stage 1 through stage 4 chronic kidney disease, or unspecified chronic kidney disease: Secondary | ICD-10-CM | POA: Diagnosis not present

## 2016-02-11 DIAGNOSIS — K219 Gastro-esophageal reflux disease without esophagitis: Secondary | ICD-10-CM | POA: Diagnosis not present

## 2016-02-11 DIAGNOSIS — Z800 Family history of malignant neoplasm of digestive organs: Secondary | ICD-10-CM | POA: Diagnosis not present

## 2016-02-11 DIAGNOSIS — J45909 Unspecified asthma, uncomplicated: Secondary | ICD-10-CM | POA: Diagnosis not present

## 2016-02-11 DIAGNOSIS — Z8249 Family history of ischemic heart disease and other diseases of the circulatory system: Secondary | ICD-10-CM | POA: Diagnosis not present

## 2016-02-11 DIAGNOSIS — N9089 Other specified noninflammatory disorders of vulva and perineum: Secondary | ICD-10-CM | POA: Diagnosis not present

## 2016-02-11 DIAGNOSIS — R6882 Decreased libido: Secondary | ICD-10-CM | POA: Diagnosis not present

## 2016-02-11 DIAGNOSIS — Z833 Family history of diabetes mellitus: Secondary | ICD-10-CM | POA: Diagnosis not present

## 2016-03-01 DIAGNOSIS — M797 Fibromyalgia: Secondary | ICD-10-CM | POA: Diagnosis not present

## 2016-03-01 DIAGNOSIS — M4806 Spinal stenosis, lumbar region: Secondary | ICD-10-CM | POA: Diagnosis not present

## 2016-03-01 DIAGNOSIS — M542 Cervicalgia: Secondary | ICD-10-CM | POA: Diagnosis not present

## 2016-03-01 DIAGNOSIS — M545 Low back pain: Secondary | ICD-10-CM | POA: Diagnosis not present

## 2016-03-01 DIAGNOSIS — Z791 Long term (current) use of non-steroidal anti-inflammatories (NSAID): Secondary | ICD-10-CM | POA: Diagnosis not present

## 2016-03-01 DIAGNOSIS — G894 Chronic pain syndrome: Secondary | ICD-10-CM | POA: Diagnosis not present

## 2016-03-01 DIAGNOSIS — M5440 Lumbago with sciatica, unspecified side: Secondary | ICD-10-CM | POA: Diagnosis not present

## 2016-03-02 DIAGNOSIS — Z96651 Presence of right artificial knee joint: Secondary | ICD-10-CM | POA: Diagnosis not present

## 2016-03-02 DIAGNOSIS — G8929 Other chronic pain: Secondary | ICD-10-CM | POA: Diagnosis not present

## 2016-03-02 DIAGNOSIS — M25561 Pain in right knee: Secondary | ICD-10-CM | POA: Diagnosis not present

## 2016-03-02 DIAGNOSIS — M47812 Spondylosis without myelopathy or radiculopathy, cervical region: Secondary | ICD-10-CM | POA: Diagnosis not present

## 2016-03-02 DIAGNOSIS — M545 Low back pain: Secondary | ICD-10-CM | POA: Diagnosis not present

## 2016-03-02 DIAGNOSIS — Z471 Aftercare following joint replacement surgery: Secondary | ICD-10-CM | POA: Diagnosis not present

## 2016-03-02 DIAGNOSIS — M542 Cervicalgia: Secondary | ICD-10-CM | POA: Diagnosis not present

## 2016-03-14 DIAGNOSIS — N901 Moderate vulvar dysplasia: Secondary | ICD-10-CM | POA: Diagnosis not present

## 2016-03-16 ENCOUNTER — Telehealth: Payer: Self-pay

## 2016-03-16 ENCOUNTER — Other Ambulatory Visit: Payer: Self-pay | Admitting: Family Medicine

## 2016-03-16 MED ORDER — CLONAZEPAM 0.5 MG PO TABS: 0.2500 mg | ORAL_TABLET | Freq: Every day | ORAL | Status: DC

## 2016-03-16 NOTE — Telephone Encounter (Signed)
I got a message from this patient regarding the request for records that was sent over by Dr. Waylan Boga office. I called her and asked how I could be of assistance and she stated after she had her consultation on yesterday she was informed by their secretary that her insurance was not accepted at their clinic, so she cancelled her future appts and asked that we do not send any of her personal information.

## 2016-03-17 DIAGNOSIS — I129 Hypertensive chronic kidney disease with stage 1 through stage 4 chronic kidney disease, or unspecified chronic kidney disease: Secondary | ICD-10-CM | POA: Diagnosis not present

## 2016-03-17 DIAGNOSIS — G894 Chronic pain syndrome: Secondary | ICD-10-CM | POA: Diagnosis not present

## 2016-03-17 DIAGNOSIS — J45909 Unspecified asthma, uncomplicated: Secondary | ICD-10-CM | POA: Diagnosis not present

## 2016-03-17 DIAGNOSIS — Z7951 Long term (current) use of inhaled steroids: Secondary | ICD-10-CM | POA: Diagnosis not present

## 2016-03-17 DIAGNOSIS — M5416 Radiculopathy, lumbar region: Secondary | ICD-10-CM | POA: Diagnosis not present

## 2016-03-17 DIAGNOSIS — Z79899 Other long term (current) drug therapy: Secondary | ICD-10-CM | POA: Diagnosis not present

## 2016-03-17 DIAGNOSIS — N181 Chronic kidney disease, stage 1: Secondary | ICD-10-CM | POA: Diagnosis not present

## 2016-03-17 DIAGNOSIS — Z888 Allergy status to other drugs, medicaments and biological substances status: Secondary | ICD-10-CM | POA: Diagnosis not present

## 2016-03-17 DIAGNOSIS — M545 Low back pain: Secondary | ICD-10-CM | POA: Diagnosis not present

## 2016-03-17 DIAGNOSIS — M329 Systemic lupus erythematosus, unspecified: Secondary | ICD-10-CM | POA: Diagnosis not present

## 2016-03-17 DIAGNOSIS — J3801 Paralysis of vocal cords and larynx, unilateral: Secondary | ICD-10-CM | POA: Diagnosis not present

## 2016-03-17 DIAGNOSIS — M797 Fibromyalgia: Secondary | ICD-10-CM | POA: Diagnosis not present

## 2016-03-17 DIAGNOSIS — M1990 Unspecified osteoarthritis, unspecified site: Secondary | ICD-10-CM | POA: Diagnosis not present

## 2016-03-17 DIAGNOSIS — K219 Gastro-esophageal reflux disease without esophagitis: Secondary | ICD-10-CM | POA: Diagnosis not present

## 2016-03-17 DIAGNOSIS — Z880 Allergy status to penicillin: Secondary | ICD-10-CM | POA: Diagnosis not present

## 2016-03-22 DIAGNOSIS — K5900 Constipation, unspecified: Secondary | ICD-10-CM | POA: Diagnosis not present

## 2016-03-28 DIAGNOSIS — M47819 Spondylosis without myelopathy or radiculopathy, site unspecified: Secondary | ICD-10-CM | POA: Diagnosis not present

## 2016-03-28 DIAGNOSIS — M5117 Intervertebral disc disorders with radiculopathy, lumbosacral region: Secondary | ICD-10-CM | POA: Diagnosis not present

## 2016-03-28 DIAGNOSIS — M5116 Intervertebral disc disorders with radiculopathy, lumbar region: Secondary | ICD-10-CM | POA: Diagnosis not present

## 2016-03-28 DIAGNOSIS — M4727 Other spondylosis with radiculopathy, lumbosacral region: Secondary | ICD-10-CM | POA: Diagnosis not present

## 2016-04-05 DIAGNOSIS — Z79899 Other long term (current) drug therapy: Secondary | ICD-10-CM | POA: Diagnosis not present

## 2016-04-05 DIAGNOSIS — J449 Chronic obstructive pulmonary disease, unspecified: Secondary | ICD-10-CM | POA: Diagnosis not present

## 2016-04-05 DIAGNOSIS — R0602 Shortness of breath: Secondary | ICD-10-CM | POA: Diagnosis not present

## 2016-04-10 ENCOUNTER — Other Ambulatory Visit: Payer: Self-pay | Admitting: Family Medicine

## 2016-04-14 ENCOUNTER — Other Ambulatory Visit: Payer: Self-pay | Admitting: Family Medicine

## 2016-04-14 ENCOUNTER — Telehealth: Payer: Self-pay | Admitting: Family Medicine

## 2016-04-14 DIAGNOSIS — J948 Other specified pleural conditions: Secondary | ICD-10-CM | POA: Diagnosis not present

## 2016-04-14 DIAGNOSIS — R0602 Shortness of breath: Secondary | ICD-10-CM | POA: Diagnosis not present

## 2016-04-14 DIAGNOSIS — I519 Heart disease, unspecified: Secondary | ICD-10-CM | POA: Diagnosis not present

## 2016-04-14 DIAGNOSIS — R918 Other nonspecific abnormal finding of lung field: Secondary | ICD-10-CM | POA: Diagnosis not present

## 2016-04-14 DIAGNOSIS — I10 Essential (primary) hypertension: Secondary | ICD-10-CM

## 2016-04-14 MED ORDER — LOSARTAN POTASSIUM-HCTZ 50-12.5 MG PO TABS: 1.0000 | ORAL_TABLET | Freq: Every day | ORAL | 2 refills | Status: DC

## 2016-04-14 NOTE — Telephone Encounter (Signed)
done

## 2016-04-14 NOTE — Telephone Encounter (Signed)
  WANTS TO SEE IF YOU WOULD SEND RX FOR BLOOD PRESSURE MEDS TO [facility] FOR 90 DAY SUPPLY. PT SAID PHARM SAID SHE HAS NOT REFILLS ON IT. ONLY HAS A FEW DAYS LEFT AND [facility] USUALLY TAKES 1 WK. (Routing comment)

## 2016-04-18 IMAGING — CR DG CHEST 2V
1 series · 3 of 3 positions shown · non-contrast
Comparison: 05/20/2014

CLINICAL DATA: Productive cough for 4 days.

EXAM:
CHEST  2 VIEW

[Series 1: dg chest 2 view · 0.14mm/px · 3 of 3 slices shown]
[im 1/3]
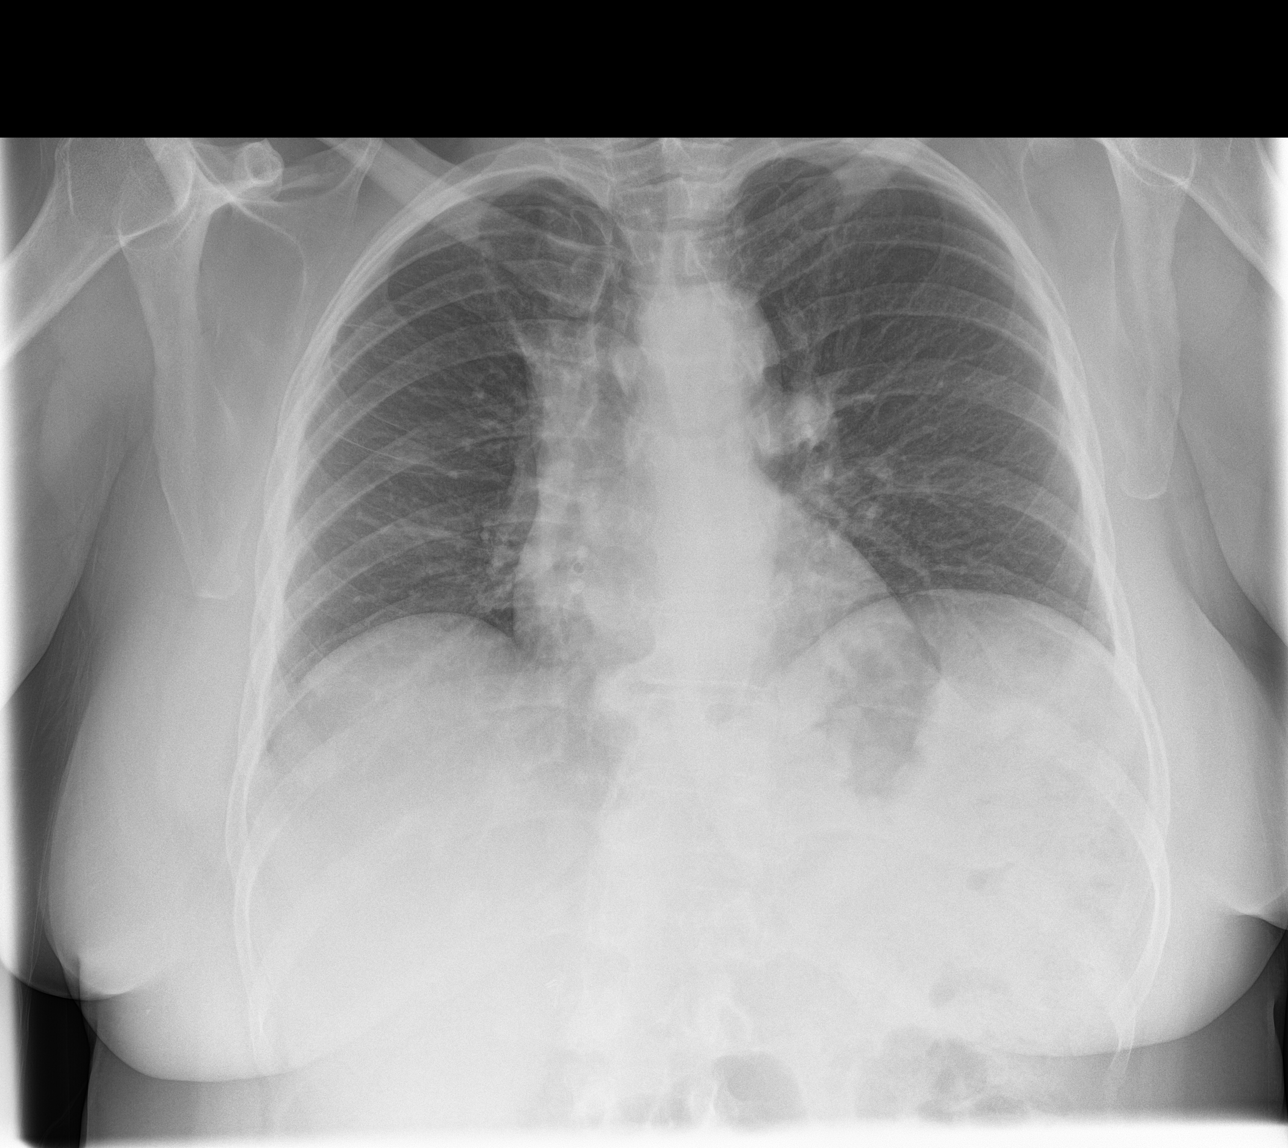
[im 2/3]
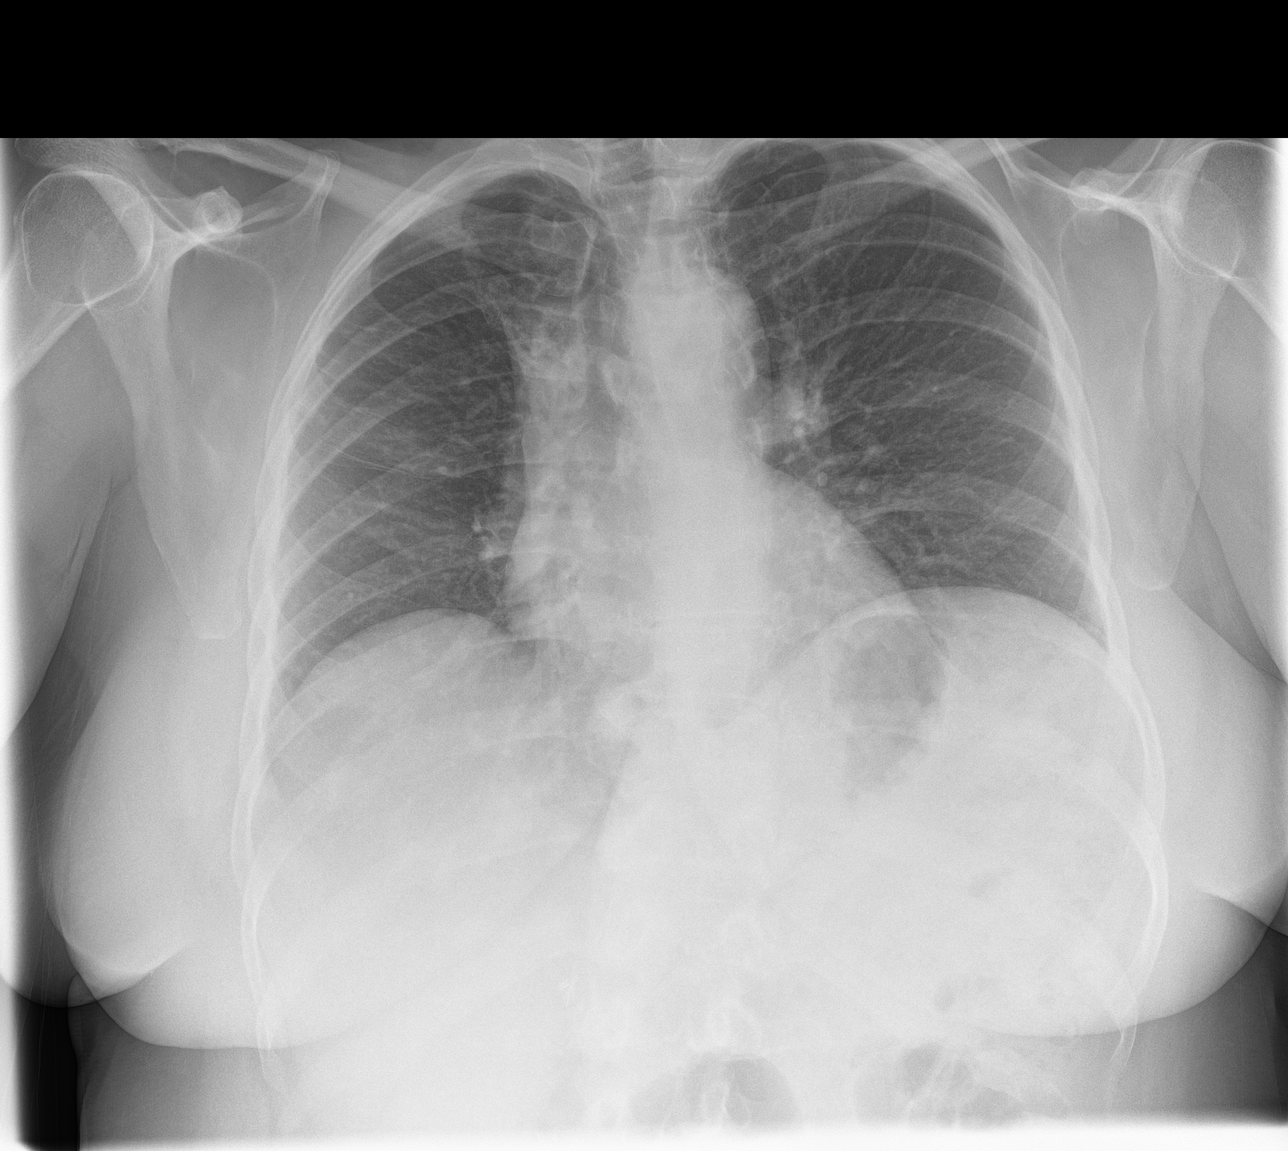
[im 3/3]
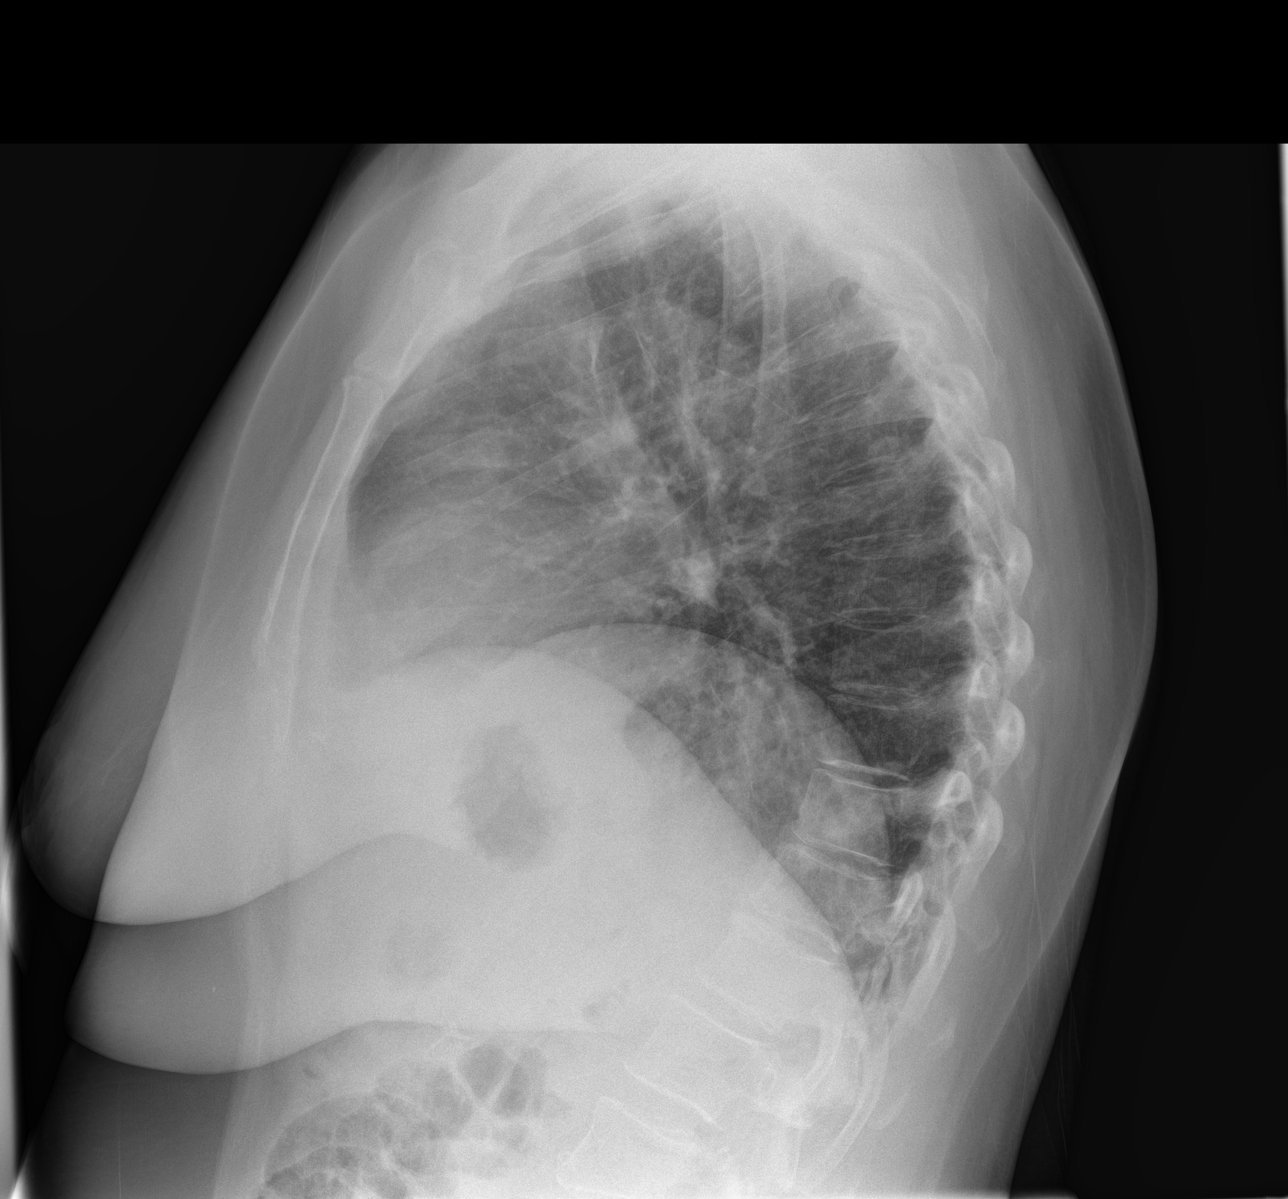

[3 of 3 positions shown; findings below may reference images not displayed]

FINDINGS: The heart size and mediastinal contours are within normal limits.
Both lungs are clear. The visualized skeletal structures are
unremarkable.
IMPRESSION: No active cardiopulmonary disease.

## 2016-04-22 DIAGNOSIS — R0602 Shortness of breath: Secondary | ICD-10-CM | POA: Diagnosis not present

## 2016-04-22 DIAGNOSIS — M3213 Lung involvement in systemic lupus erythematosus: Secondary | ICD-10-CM | POA: Diagnosis not present

## 2016-04-25 DIAGNOSIS — Z9889 Other specified postprocedural states: Secondary | ICD-10-CM | POA: Diagnosis not present

## 2016-04-25 DIAGNOSIS — M797 Fibromyalgia: Secondary | ICD-10-CM | POA: Diagnosis not present

## 2016-04-25 DIAGNOSIS — J8489 Other specified interstitial pulmonary diseases: Secondary | ICD-10-CM | POA: Diagnosis not present

## 2016-04-25 DIAGNOSIS — Z880 Allergy status to penicillin: Secondary | ICD-10-CM | POA: Diagnosis not present

## 2016-04-25 DIAGNOSIS — Z888 Allergy status to other drugs, medicaments and biological substances status: Secondary | ICD-10-CM | POA: Diagnosis not present

## 2016-04-25 DIAGNOSIS — N181 Chronic kidney disease, stage 1: Secondary | ICD-10-CM | POA: Diagnosis not present

## 2016-04-25 DIAGNOSIS — R0989 Other specified symptoms and signs involving the circulatory and respiratory systems: Secondary | ICD-10-CM | POA: Diagnosis not present

## 2016-04-25 DIAGNOSIS — R51 Headache: Secondary | ICD-10-CM | POA: Diagnosis not present

## 2016-04-25 DIAGNOSIS — Z79899 Other long term (current) drug therapy: Secondary | ICD-10-CM | POA: Diagnosis not present

## 2016-04-25 DIAGNOSIS — I129 Hypertensive chronic kidney disease with stage 1 through stage 4 chronic kidney disease, or unspecified chronic kidney disease: Secondary | ICD-10-CM | POA: Diagnosis not present

## 2016-04-25 DIAGNOSIS — K5909 Other constipation: Secondary | ICD-10-CM | POA: Diagnosis not present

## 2016-04-25 DIAGNOSIS — J45909 Unspecified asthma, uncomplicated: Secondary | ICD-10-CM | POA: Diagnosis not present

## 2016-04-25 DIAGNOSIS — R079 Chest pain, unspecified: Secondary | ICD-10-CM | POA: Diagnosis not present

## 2016-04-25 DIAGNOSIS — J82 Pulmonary eosinophilia, not elsewhere classified: Secondary | ICD-10-CM | POA: Diagnosis not present

## 2016-04-25 DIAGNOSIS — R918 Other nonspecific abnormal finding of lung field: Secondary | ICD-10-CM | POA: Diagnosis not present

## 2016-04-25 DIAGNOSIS — H905 Unspecified sensorineural hearing loss: Secondary | ICD-10-CM | POA: Diagnosis not present

## 2016-04-25 DIAGNOSIS — M1990 Unspecified osteoarthritis, unspecified site: Secondary | ICD-10-CM | POA: Diagnosis not present

## 2016-04-25 DIAGNOSIS — K219 Gastro-esophageal reflux disease without esophagitis: Secondary | ICD-10-CM | POA: Diagnosis not present

## 2016-04-25 DIAGNOSIS — Z7951 Long term (current) use of inhaled steroids: Secondary | ICD-10-CM | POA: Diagnosis not present

## 2016-04-29 ENCOUNTER — Encounter: Payer: Self-pay | Admitting: Family Medicine

## 2016-04-29 ENCOUNTER — Ambulatory Visit (INDEPENDENT_AMBULATORY_CARE_PROVIDER_SITE_OTHER): Payer: Medicare Other | Admitting: Family Medicine

## 2016-04-29 VITALS — BP 118/68 | HR 87 | Temp 98.3°F | Resp 18 | Ht 68.0 in | Wt 241.2 lb

## 2016-04-29 DIAGNOSIS — E785 Hyperlipidemia, unspecified: Secondary | ICD-10-CM | POA: Diagnosis not present

## 2016-04-29 DIAGNOSIS — Z79899 Other long term (current) drug therapy: Secondary | ICD-10-CM | POA: Diagnosis not present

## 2016-04-29 DIAGNOSIS — F339 Major depressive disorder, recurrent, unspecified: Secondary | ICD-10-CM

## 2016-04-29 DIAGNOSIS — I10 Essential (primary) hypertension: Secondary | ICD-10-CM | POA: Diagnosis not present

## 2016-04-29 DIAGNOSIS — E8881 Metabolic syndrome: Secondary | ICD-10-CM | POA: Diagnosis not present

## 2016-04-29 DIAGNOSIS — R739 Hyperglycemia, unspecified: Secondary | ICD-10-CM | POA: Diagnosis not present

## 2016-04-29 DIAGNOSIS — J8410 Pulmonary fibrosis, unspecified: Secondary | ICD-10-CM | POA: Diagnosis not present

## 2016-04-29 DIAGNOSIS — E559 Vitamin D deficiency, unspecified: Secondary | ICD-10-CM

## 2016-04-29 DIAGNOSIS — F411 Generalized anxiety disorder: Secondary | ICD-10-CM

## 2016-04-29 LAB — LIPID PANEL
Cholesterol: 184 mg/dL (ref 125–200)
HDL: 55 mg/dL (ref >=46)
LDL Cholesterol: 115 mg/dL (ref <130)
Total CHOL/HDL Ratio: 3.3 Ratio (ref <=5.0)
Triglycerides: 72 mg/dL (ref <150)
VLDL: 14 mg/dL (ref <30)

## 2016-04-29 LAB — COMPLETE METABOLIC PANEL WITH GFR
ALT: 14 U/L (ref 6–29)
AST: 21 U/L (ref 10–35)
Albumin: 3.6 g/dL (ref 3.6–5.1)
Alkaline Phosphatase: 74 U/L (ref 33–130)
BUN: 7 mg/dL (ref 7–25)
CHLORIDE: 104 mmol/L (ref 98–110)
CO2: 27 mmol/L (ref 20–31)
CREATININE: 0.86 mg/dL (ref 0.50–1.05)
Calcium: 9.7 mg/dL (ref 8.6–10.4)
GFR, Est African American: 86 mL/min (ref >=60)
GFR, Est Non African American: 75 mL/min (ref >=60)
GLUCOSE: 86 mg/dL (ref 65–99)
Potassium: 5.2 mmol/L (ref 3.5–5.3)
Sodium: 140 mmol/L (ref 135–146)
Total Bilirubin: 0.3 mg/dL (ref 0.2–1.2)
Total Protein: 8 g/dL (ref 6.1–8.1)

## 2016-04-29 LAB — CBC WITH DIFFERENTIAL/PLATELET
BASOS ABS: 44 {cells}/uL (ref 0–200)
Basophils Relative: 1 %
EOS ABS: 132 {cells}/uL (ref 15–500)
EOS PCT: 3 %
HCT: 40.7 % (ref 35.0–45.0)
HEMOGLOBIN: 13.4 g/dL (ref 11.7–15.5)
Lymphocytes Relative: 38 %
Lymphs Abs: 1672 cells/uL (ref 850–3900)
MCH: 27.7 pg (ref 27.0–33.0)
MCHC: 32.9 g/dL (ref 32.0–36.0)
MCV: 84.1 fL (ref 80.0–100.0)
MONO ABS: 484 {cells}/uL (ref 200–950)
MONOS PCT: 11 %
MPV: 9.7 fL (ref 7.5–12.5)
NEUTROS PCT: 47 %
Neutro Abs: 2068 cells/uL (ref 1500–7800)
PLATELETS: 349 10*3/uL (ref 140–400)
RBC: 4.84 MIL/uL (ref 3.80–5.10)
RDW: 14.6 % (ref 11.0–15.0)
WBC: 4.4 10*3/uL (ref 3.8–10.8)

## 2016-04-29 MED ORDER — LOSARTAN POTASSIUM 25 MG PO TABS: 25.0000 mg | ORAL_TABLET | Freq: Every day | ORAL | 0 refills | Status: DC

## 2016-04-29 MED ORDER — LOSARTAN POTASSIUM-HCTZ 50-12.5 MG PO TABS: 0.5000 | ORAL_TABLET | Freq: Every day | ORAL | 0 refills | Status: DC

## 2016-04-29 NOTE — Progress Notes (Signed)
Name: Nicole Ballard   MRN: [id number]    DOB: July 29, 1956   Date:04/29/2016       Progress Note  Subjective  Chief Complaint  Chief Complaint  Patient presents with  . Neck Pain  . URI    for 1 week    HPI  Dyslipidemia: she is due for labs, not on statin therapy, she does not have chest pain but has decrease in exercise tolerance and is seeing pulmonologist.   Cough: had a bronchoscopy on Monday and a lung biopsy, states coughing more than usual since procedure and feeling a little hoarse, she denies fever  Dizziness: she continues to have recurrent episodes of dizziness, no tinnitus, she has hearing loss right side. Seen by ENT and neurologist in the past. Symptoms stable, episodes are intermittent.   Major Depression: seeing psychiatrist at [facility], taking medication as prescribed, getting very concerned about her health again, and feels discouraged at times. Going to too many appointments. She would like to have answers about her SOB. She gets frustrated. Denies suicide thoughts, she states when feeling very down she spends time with family  Metabolic Syndrome: she denies polyphagia, polydipsia or polyuria.   Insomnia: sleeping better with new medication adjustment, when Cymbalta was switched to morning dose  Patient Active Problem List   Diagnosis Date Noted  . Neck pain, musculoskeletal 12/15/2015  . Anxiety about health 09/29/2015  . Cough 09/11/2015  . Abnormal CAT scan 04/11/2015  . Auditory impairment 04/11/2015  . Insomnia, persistent 04/11/2015  . Chronic kidney disease (CKD), stage I 04/11/2015  . Chronic nonmalignant pain 04/11/2015  . Chronic constipation 04/11/2015  . Dyslipidemia 04/11/2015  . Fibromyalgia 04/11/2015  . Gastro-esophageal reflux disease without esophagitis 04/11/2015  . Dysphonia 04/11/2015  . Absence of bladder continence 04/11/2015  . LBP (low back pain) 04/11/2015  . Eczema intertrigo 04/11/2015  . Keratosis pilaris 04/11/2015  .  Chronic recurrent major depressive disorder ([place]) 04/11/2015  . Dysmetabolic syndrome [id number]  . Neurogenic claudication 04/11/2015  . Perennial allergic rhinitis with seasonal variation 04/11/2015  . Abnormal presence of protein in urine 04/11/2015  . Seborrhea capitis 04/11/2015  . Moderate dysplasia of vulva 04/11/2015  . Vitamin D deficiency 04/11/2015  . Treadmill stress test negative for angina pectoris 03/05/2015  . Shortness of breath 05/20/2014  . Morbid obesity ([place]) 02/03/2014  . Asthma, chronic 12/31/2013  . H/O total knee replacement 12/31/2013  . Episcleritis 09/26/2013  . Vocal cord dysfunction 05/28/2013  . Anxiety, generalized 03/19/2013  . Back pain 12/18/2012  . Pulmonary nodules 11/26/2012  . Lung nodule, multiple 11/26/2012  . Arthritis, degenerative 11/01/2012  . H/O aspiration pneumonitis 10/22/2012  . Postinflammatory pulmonary fibrosis ([place]) 10/22/2012  . Mixed incontinence 05/04/2012  . Benign neoplasm of pituitary gland and craniopharyngeal duct ([place]) 12/29/2010  . Lupus (systemic lupus erythematosus) ([place]) 11/05/2010  . Chronic nausea 07/01/2008  . Benign hypertension 01/25/2005    Past Surgical History:  Procedure Laterality Date  . BRAIN SURGERY    . CHOLECYSTECTOMY    . VAGINAL HYSTERECTOMY      Family History  Problem Relation Age of Onset  . Breast cancer Mother   . Cancer Mother 11    Breast  . Cancer Father 52    Stomach  . Breast cancer Maternal Aunt     Social History   Social History  . Marital status: Divorced    Spouse name: N/A  . Number of children: N/A  . Years of education: N/A  Occupational History  . Not on file.   Social History Main Topics  . Smoking status: Never Smoker  . Smokeless tobacco: Never Used  . Alcohol use No  . Drug use: No  . Sexual activity: Not Currently   Other Topics Concern  . Not on file   Social History Narrative  . No narrative on file     Current Outpatient Prescriptions:   .  albuterol (PROAIR HFA) 108 (90 BASE) MCG/ACT inhaler, Inhale 2 puffs into the lungs every 6 (six) hours as needed for wheezing. DX code R06.02, Disp: 3 Inhaler, Rfl: 3 .  Cholecalciferol (VITAMIN D) 2000 UNITS CAPS, Take 1 capsule by mouth daily., Disp: , Rfl:  .  clonazePAM (KLONOPIN) 0.5 MG tablet, Take 0.5 tablets (0.25 mg total) by mouth daily., Disp: 15 tablet, Rfl: 0 .  cyclobenzaprine (FLEXERIL) 5 MG tablet, Take 1 tablet (5 mg total) by mouth every 8 (eight) hours as needed for muscle spasms. Do not drive for 8 hours, Disp: 21 tablet, Rfl: 0 .  diclofenac sodium (VOLTAREN) 1 % GEL, APP 2 GRAMS EXT AA QID, Disp: , Rfl: 6 .  docusate sodium (COLACE) 100 MG capsule, Take 2 capsules (200 mg total) by mouth 2 (two) times daily., Disp: 120 capsule, Rfl: 0 .  DULoxetine (CYMBALTA) 60 MG capsule, Take 120 mg by mouth at bedtime. , Disp: , Rfl:  .  Estradiol 10 MCG TABS vaginal tablet, Place 0.01 mg vaginally., Disp: , Rfl:  .  FLOVENT HFA 110 MCG/ACT inhaler, INHALE 2 PUFFS PO BID, Disp: , Rfl: 0 .  fluticasone (FLONASE) 50 MCG/ACT nasal spray, Use 2 sprays in each  nostril daily, Disp: 48 g, Rfl: 0 .  Glycerin, Laxative, (RA GLYCERIN ADULT) 80.7 % SUPP, Place rectally., Disp: , Rfl:  .  hydroxychloroquine (PLAQUENIL) 200 MG tablet, Take 200 mg by mouth 2 (two) times daily., Disp: , Rfl:  .  lamoTRIgine (LAMICTAL) 100 MG tablet, Take 1 tablet by mouth 3 (three) times daily., Disp: , Rfl:  .  losartan-hydrochlorothiazide (HYZAAR) 50-12.5 MG tablet, Take 0.5 tablets by mouth daily., Disp: 90 tablet, Rfl: 0 .  meclizine (ANTIVERT) 25 MG tablet, Take 1 tablet (25 mg total) by mouth 3 (three) times daily as needed., Disp: 30 tablet, Rfl: 0 .  Multiple Vitamin (MULTIVITAMIN) tablet, Take 1 tablet by mouth daily., Disp: , Rfl:  .  omeprazole (PRILOSEC) 40 MG capsule, Take 1 capsule (40 mg total) by mouth daily., Disp: 180 capsule, Rfl: 3 .  ondansetron (ZOFRAN) 4 MG tablet, Take 1 tablet (4 mg total)  by mouth every 8 (eight) hours as needed for nausea or vomiting., Disp: 180 tablet, Rfl: 0 .  polyethylene glycol powder (GLYCOLAX/MIRALAX) powder, 2 cap fulls in a full glass of water, three times a day, for 5 days. (Patient not taking: Reported on 08/06/2015), Disp: 255 g, Rfl: 0 .  polyethylene glycol-electrolytes (NULYTELY/GOLYTELY) 420 g solution, TK AS DIRECTED, Disp: , Rfl: 2 .  topiramate (TOPAMAX) 50 MG tablet, , Disp: , Rfl:  .  traZODone (DESYREL) 150 MG tablet, Take 150 mg by mouth at bedtime., Disp: , Rfl:   Allergies  Allergen Reactions  . Ace Inhibitors     Other reaction(s): OTHER Pt states she can not take ace inhibitors. Pt states she can not remember her reaction.   Marland Kitchen Penicillins      ROS  Constitutional: Negative for fever or weight change.  Respiratory: Positive for cough and shortness of breath.  Cardiovascular: Negative for chest pain or palpitations.  Gastrointestinal: Negative for abdominal pain, no bowel changes.  Musculoskeletal: Positive for gait problem and intermittent joint swelling.  Skin: Negative for rash. She has been feeling very itchy Neurological: Positive dizziness and intermittent  headache.  No other specific complaints in a complete review of systems (except as listed in HPI above).  Objective  Vitals:   04/29/16 1059  BP: 118/68  Pulse: 87  Resp: 18  Temp: 98.3 F (36.8 C)  SpO2: 94%  Weight: 241 lb 3 oz (109.4 kg)  Height: 5\' 8"  (1.727 m)    Body mass index is 36.67 kg/m.  Physical Exam  Constitutional: Patient appears well-developed and well-nourished. Obese  No distress.  HEENT: head atraumatic, normocephalic, pupils equal and reactive to light, neck supple, throat within normal limits Cardiovascular: Normal rate, regular rhythm and normal heart sounds.  No murmur heard. No BLE edema. Pulmonary/Chest: Effort normal and breath sounds normal. No respiratory distress. Abdominal: Soft.  There is no tenderness. Psychiatric:  Patient has a normal mood and affect. behavior is normal. Judgment and thought content normal. Muscular Skeletal: scar from previous surgery on right knee, mild effusion, no redness or increase in warmth   PHQ2/9: Depression screen [facility] 2/9 04/29/2016 01/28/2016 12/30/2015 12/15/2015 09/29/2015  Decreased Interest 0 0 0 0 0  Down, Depressed, Hopeless 1 0 0 0 0  PHQ - 2 Score 1 0 0 0 0     Fall Risk: Fall Risk  04/29/2016 01/28/2016 12/30/2015 12/15/2015 09/29/2015  Falls in the past year? No No No No No  Number falls in past yr: - - - - -  Injury with Fall? - - - - -    Functional Status Survey: Is the patient deaf or have difficulty hearing?: Yes (rt side deafness) Does the patient have difficulty seeing, even when wearing glasses/contacts?: No Does the patient have difficulty concentrating, remembering, or making decisions?: No Does the patient have difficulty walking or climbing stairs?: Yes (knee replacement in rt knee) Does the patient have difficulty dressing or bathing?: No Does the patient have difficulty doing errands alone such as visiting a doctor's office or shopping?: No    Assessment & Plan  1. Benign hypertension  Low still, we will change from losartan/hctz to losartan 25 mg daily  - COMPLETE METABOLIC PANEL WITH GFR  2. Anxiety, generalized  Getting very anxious about her health again   3. Postinflammatory pulmonary fibrosis (HCC)  With worsening of SOB  4. Dysmetabolic syndrome  Check [id number]  5. Chronic recurrent major depressive disorder ([place])  Seeing Psychiatrist, stable at this time  6. Dyslipidemia  - Lipid panel  7. Hyperglycemia  - Hemoglobin A1c  8. Vitamin D deficiency  - VITAMIN D 25 Hydroxy (Vit-D Deficiency, Fractures)  9. Encounter for long-term (current) use of high-risk medication  - CBC with Differential/Platelet

## 2016-04-30 LAB — HEMOGLOBIN A1C
HEMOGLOBIN A1C: 5.5 % (ref <5.7)
MEAN PLASMA GLUCOSE: 111 mg/dL

## 2016-04-30 LAB — VITAMIN D 25 HYDROXY (VIT D DEFICIENCY, FRACTURES): VIT D 25 HYDROXY: 24 ng/mL — AB (ref 30–100)

## 2016-05-01 ENCOUNTER — Other Ambulatory Visit: Payer: Self-pay | Admitting: Family Medicine

## 2016-05-01 MED ORDER — VITAMIN D (ERGOCALCIFEROL) 1.25 MG (50000 UNIT) PO CAPS: 50000.0000 [IU] | ORAL_CAPSULE | ORAL | 0 refills | Status: DC

## 2016-05-06 DIAGNOSIS — K5900 Constipation, unspecified: Secondary | ICD-10-CM | POA: Diagnosis not present

## 2016-05-06 DIAGNOSIS — K5909 Other constipation: Secondary | ICD-10-CM | POA: Diagnosis not present

## 2016-05-06 DIAGNOSIS — R0602 Shortness of breath: Secondary | ICD-10-CM | POA: Diagnosis not present

## 2016-05-12 ENCOUNTER — Telehealth: Payer: Self-pay | Admitting: Family Medicine

## 2016-05-15 NOTE — Telephone Encounter (Signed)
Please see if she can follow up with ENT or neurologist., otherwise she needs to go to Urgent care.

## 2016-05-16 NOTE — Telephone Encounter (Signed)
Patient did schedule appointment with neurologist.

## 2016-05-23 DIAGNOSIS — M542 Cervicalgia: Secondary | ICD-10-CM | POA: Diagnosis not present

## 2016-05-23 DIAGNOSIS — G894 Chronic pain syndrome: Secondary | ICD-10-CM | POA: Diagnosis not present

## 2016-05-23 DIAGNOSIS — M4806 Spinal stenosis, lumbar region: Secondary | ICD-10-CM | POA: Diagnosis not present

## 2016-05-23 DIAGNOSIS — M545 Low back pain: Secondary | ICD-10-CM | POA: Diagnosis not present

## 2016-05-23 DIAGNOSIS — M797 Fibromyalgia: Secondary | ICD-10-CM | POA: Diagnosis not present

## 2016-05-23 DIAGNOSIS — G8929 Other chronic pain: Secondary | ICD-10-CM | POA: Diagnosis not present

## 2016-05-23 DIAGNOSIS — M1288 Other specific arthropathies, not elsewhere classified, other specified site: Secondary | ICD-10-CM | POA: Diagnosis not present

## 2016-05-26 DIAGNOSIS — Z888 Allergy status to other drugs, medicaments and biological substances status: Secondary | ICD-10-CM | POA: Diagnosis not present

## 2016-05-26 DIAGNOSIS — Z7952 Long term (current) use of systemic steroids: Secondary | ICD-10-CM | POA: Diagnosis not present

## 2016-05-26 DIAGNOSIS — M3219 Other organ or system involvement in systemic lupus erythematosus: Secondary | ICD-10-CM | POA: Diagnosis not present

## 2016-05-26 DIAGNOSIS — Z79899 Other long term (current) drug therapy: Secondary | ICD-10-CM | POA: Diagnosis not present

## 2016-05-26 DIAGNOSIS — M1990 Unspecified osteoarthritis, unspecified site: Secondary | ICD-10-CM | POA: Diagnosis not present

## 2016-05-26 DIAGNOSIS — R05 Cough: Secondary | ICD-10-CM | POA: Diagnosis not present

## 2016-05-26 DIAGNOSIS — M797 Fibromyalgia: Secondary | ICD-10-CM | POA: Diagnosis not present

## 2016-05-26 DIAGNOSIS — Z96651 Presence of right artificial knee joint: Secondary | ICD-10-CM | POA: Diagnosis not present

## 2016-05-26 DIAGNOSIS — I129 Hypertensive chronic kidney disease with stage 1 through stage 4 chronic kidney disease, or unspecified chronic kidney disease: Secondary | ICD-10-CM | POA: Diagnosis not present

## 2016-05-26 DIAGNOSIS — R0602 Shortness of breath: Secondary | ICD-10-CM | POA: Diagnosis not present

## 2016-05-26 DIAGNOSIS — E1122 Type 2 diabetes mellitus with diabetic chronic kidney disease: Secondary | ICD-10-CM | POA: Diagnosis not present

## 2016-05-26 DIAGNOSIS — E785 Hyperlipidemia, unspecified: Secondary | ICD-10-CM | POA: Diagnosis not present

## 2016-05-26 DIAGNOSIS — J84112 Idiopathic pulmonary fibrosis: Secondary | ICD-10-CM | POA: Diagnosis not present

## 2016-05-26 DIAGNOSIS — Z23 Encounter for immunization: Secondary | ICD-10-CM | POA: Diagnosis not present

## 2016-05-26 DIAGNOSIS — I272 Other secondary pulmonary hypertension: Secondary | ICD-10-CM | POA: Diagnosis not present

## 2016-05-26 DIAGNOSIS — J8410 Pulmonary fibrosis, unspecified: Secondary | ICD-10-CM | POA: Diagnosis not present

## 2016-05-26 DIAGNOSIS — Z7951 Long term (current) use of inhaled steroids: Secondary | ICD-10-CM | POA: Diagnosis not present

## 2016-05-26 DIAGNOSIS — N181 Chronic kidney disease, stage 1: Secondary | ICD-10-CM | POA: Diagnosis not present

## 2016-05-26 DIAGNOSIS — R21 Rash and other nonspecific skin eruption: Secondary | ICD-10-CM | POA: Diagnosis not present

## 2016-05-26 DIAGNOSIS — Z880 Allergy status to penicillin: Secondary | ICD-10-CM | POA: Diagnosis not present

## 2016-05-27 DIAGNOSIS — R930 Abnormal findings on diagnostic imaging of skull and head, not elsewhere classified: Secondary | ICD-10-CM | POA: Diagnosis not present

## 2016-05-27 DIAGNOSIS — D497 Neoplasm of unspecified behavior of endocrine glands and other parts of nervous system: Secondary | ICD-10-CM | POA: Diagnosis not present

## 2016-06-03 DIAGNOSIS — J45909 Unspecified asthma, uncomplicated: Secondary | ICD-10-CM | POA: Diagnosis not present

## 2016-06-03 DIAGNOSIS — Z7951 Long term (current) use of inhaled steroids: Secondary | ICD-10-CM | POA: Diagnosis not present

## 2016-06-03 DIAGNOSIS — M1990 Unspecified osteoarthritis, unspecified site: Secondary | ICD-10-CM | POA: Diagnosis not present

## 2016-06-03 DIAGNOSIS — Z9181 History of falling: Secondary | ICD-10-CM | POA: Diagnosis not present

## 2016-06-03 DIAGNOSIS — Z79899 Other long term (current) drug therapy: Secondary | ICD-10-CM | POA: Diagnosis not present

## 2016-06-03 DIAGNOSIS — M329 Systemic lupus erythematosus, unspecified: Secondary | ICD-10-CM | POA: Diagnosis not present

## 2016-06-03 DIAGNOSIS — R531 Weakness: Secondary | ICD-10-CM | POA: Diagnosis not present

## 2016-06-03 DIAGNOSIS — M25561 Pain in right knee: Secondary | ICD-10-CM | POA: Diagnosis not present

## 2016-06-03 DIAGNOSIS — M545 Low back pain: Secondary | ICD-10-CM | POA: Diagnosis not present

## 2016-06-03 DIAGNOSIS — M797 Fibromyalgia: Secondary | ICD-10-CM | POA: Diagnosis not present

## 2016-06-03 DIAGNOSIS — Z96651 Presence of right artificial knee joint: Secondary | ICD-10-CM | POA: Diagnosis not present

## 2016-06-03 DIAGNOSIS — R05 Cough: Secondary | ICD-10-CM | POA: Diagnosis not present

## 2016-06-03 DIAGNOSIS — R0602 Shortness of breath: Secondary | ICD-10-CM | POA: Diagnosis not present

## 2016-06-03 DIAGNOSIS — G8929 Other chronic pain: Secondary | ICD-10-CM | POA: Diagnosis not present

## 2016-06-03 DIAGNOSIS — Z7952 Long term (current) use of systemic steroids: Secondary | ICD-10-CM | POA: Diagnosis not present

## 2016-06-03 DIAGNOSIS — Z880 Allergy status to penicillin: Secondary | ICD-10-CM | POA: Diagnosis not present

## 2016-06-03 DIAGNOSIS — Z7409 Other reduced mobility: Secondary | ICD-10-CM | POA: Diagnosis not present

## 2016-06-03 DIAGNOSIS — K5909 Other constipation: Secondary | ICD-10-CM | POA: Diagnosis not present

## 2016-06-03 DIAGNOSIS — N181 Chronic kidney disease, stage 1: Secondary | ICD-10-CM | POA: Diagnosis not present

## 2016-06-03 DIAGNOSIS — H905 Unspecified sensorineural hearing loss: Secondary | ICD-10-CM | POA: Diagnosis not present

## 2016-06-03 DIAGNOSIS — I129 Hypertensive chronic kidney disease with stage 1 through stage 4 chronic kidney disease, or unspecified chronic kidney disease: Secondary | ICD-10-CM | POA: Diagnosis not present

## 2016-06-14 DIAGNOSIS — J45909 Unspecified asthma, uncomplicated: Secondary | ICD-10-CM | POA: Diagnosis not present

## 2016-06-14 DIAGNOSIS — Z880 Allergy status to penicillin: Secondary | ICD-10-CM | POA: Diagnosis not present

## 2016-06-14 DIAGNOSIS — I129 Hypertensive chronic kidney disease with stage 1 through stage 4 chronic kidney disease, or unspecified chronic kidney disease: Secondary | ICD-10-CM | POA: Diagnosis not present

## 2016-06-14 DIAGNOSIS — M545 Low back pain: Secondary | ICD-10-CM | POA: Diagnosis not present

## 2016-06-14 DIAGNOSIS — M25561 Pain in right knee: Secondary | ICD-10-CM | POA: Diagnosis not present

## 2016-06-14 DIAGNOSIS — M1990 Unspecified osteoarthritis, unspecified site: Secondary | ICD-10-CM | POA: Diagnosis not present

## 2016-06-14 DIAGNOSIS — Z7952 Long term (current) use of systemic steroids: Secondary | ICD-10-CM | POA: Diagnosis not present

## 2016-06-14 DIAGNOSIS — G8929 Other chronic pain: Secondary | ICD-10-CM | POA: Diagnosis not present

## 2016-06-14 DIAGNOSIS — Z9181 History of falling: Secondary | ICD-10-CM | POA: Diagnosis not present

## 2016-06-14 DIAGNOSIS — R531 Weakness: Secondary | ICD-10-CM | POA: Diagnosis not present

## 2016-06-14 DIAGNOSIS — K5909 Other constipation: Secondary | ICD-10-CM | POA: Diagnosis not present

## 2016-06-14 DIAGNOSIS — Z7951 Long term (current) use of inhaled steroids: Secondary | ICD-10-CM | POA: Diagnosis not present

## 2016-06-14 DIAGNOSIS — N181 Chronic kidney disease, stage 1: Secondary | ICD-10-CM | POA: Diagnosis not present

## 2016-06-14 DIAGNOSIS — M797 Fibromyalgia: Secondary | ICD-10-CM | POA: Diagnosis not present

## 2016-06-14 DIAGNOSIS — R0602 Shortness of breath: Secondary | ICD-10-CM | POA: Diagnosis not present

## 2016-06-14 DIAGNOSIS — Z79899 Other long term (current) drug therapy: Secondary | ICD-10-CM | POA: Diagnosis not present

## 2016-06-14 DIAGNOSIS — Z96651 Presence of right artificial knee joint: Secondary | ICD-10-CM | POA: Diagnosis not present

## 2016-06-14 DIAGNOSIS — R05 Cough: Secondary | ICD-10-CM | POA: Diagnosis not present

## 2016-06-14 DIAGNOSIS — Z7409 Other reduced mobility: Secondary | ICD-10-CM | POA: Diagnosis not present

## 2016-06-14 DIAGNOSIS — H905 Unspecified sensorineural hearing loss: Secondary | ICD-10-CM | POA: Diagnosis not present

## 2016-06-14 DIAGNOSIS — M329 Systemic lupus erythematosus, unspecified: Secondary | ICD-10-CM | POA: Diagnosis not present

## 2016-06-15 DIAGNOSIS — M329 Systemic lupus erythematosus, unspecified: Secondary | ICD-10-CM | POA: Diagnosis not present

## 2016-06-15 DIAGNOSIS — Z79899 Other long term (current) drug therapy: Secondary | ICD-10-CM | POA: Diagnosis not present

## 2016-06-15 DIAGNOSIS — I129 Hypertensive chronic kidney disease with stage 1 through stage 4 chronic kidney disease, or unspecified chronic kidney disease: Secondary | ICD-10-CM | POA: Diagnosis not present

## 2016-06-15 DIAGNOSIS — J45909 Unspecified asthma, uncomplicated: Secondary | ICD-10-CM | POA: Diagnosis not present

## 2016-06-15 DIAGNOSIS — Z7951 Long term (current) use of inhaled steroids: Secondary | ICD-10-CM | POA: Diagnosis not present

## 2016-06-15 DIAGNOSIS — M797 Fibromyalgia: Secondary | ICD-10-CM | POA: Diagnosis not present

## 2016-06-15 DIAGNOSIS — K5909 Other constipation: Secondary | ICD-10-CM | POA: Diagnosis not present

## 2016-06-15 DIAGNOSIS — Z9181 History of falling: Secondary | ICD-10-CM | POA: Diagnosis not present

## 2016-06-15 DIAGNOSIS — M1990 Unspecified osteoarthritis, unspecified site: Secondary | ICD-10-CM | POA: Diagnosis not present

## 2016-06-15 DIAGNOSIS — Z880 Allergy status to penicillin: Secondary | ICD-10-CM | POA: Diagnosis not present

## 2016-06-15 DIAGNOSIS — H905 Unspecified sensorineural hearing loss: Secondary | ICD-10-CM | POA: Diagnosis not present

## 2016-06-15 DIAGNOSIS — R05 Cough: Secondary | ICD-10-CM | POA: Diagnosis not present

## 2016-06-15 DIAGNOSIS — R0602 Shortness of breath: Secondary | ICD-10-CM | POA: Diagnosis not present

## 2016-06-15 DIAGNOSIS — M25561 Pain in right knee: Secondary | ICD-10-CM | POA: Diagnosis not present

## 2016-06-15 DIAGNOSIS — M545 Low back pain: Secondary | ICD-10-CM | POA: Diagnosis not present

## 2016-06-15 DIAGNOSIS — Z7409 Other reduced mobility: Secondary | ICD-10-CM | POA: Diagnosis not present

## 2016-06-15 DIAGNOSIS — Z96651 Presence of right artificial knee joint: Secondary | ICD-10-CM | POA: Diagnosis not present

## 2016-06-15 DIAGNOSIS — G8929 Other chronic pain: Secondary | ICD-10-CM | POA: Diagnosis not present

## 2016-06-15 DIAGNOSIS — R531 Weakness: Secondary | ICD-10-CM | POA: Diagnosis not present

## 2016-06-15 DIAGNOSIS — N181 Chronic kidney disease, stage 1: Secondary | ICD-10-CM | POA: Diagnosis not present

## 2016-06-15 DIAGNOSIS — Z7952 Long term (current) use of systemic steroids: Secondary | ICD-10-CM | POA: Diagnosis not present

## 2016-07-04 ENCOUNTER — Other Ambulatory Visit: Payer: Self-pay | Admitting: Family Medicine

## 2016-07-04 DIAGNOSIS — K219 Gastro-esophageal reflux disease without esophagitis: Secondary | ICD-10-CM

## 2016-07-04 NOTE — Telephone Encounter (Signed)
Patient requesting refill of Omeprazole to [facility]. 

## 2016-07-12 ENCOUNTER — Encounter: Payer: Self-pay | Admitting: Family Medicine

## 2016-07-12 ENCOUNTER — Ambulatory Visit (INDEPENDENT_AMBULATORY_CARE_PROVIDER_SITE_OTHER): Payer: Medicare Other | Admitting: Family Medicine

## 2016-07-12 VITALS — BP 128/78 | HR 110 | Temp 98.4°F | Resp 16 | Ht 68.0 in | Wt 239.4 lb

## 2016-07-12 DIAGNOSIS — K5909 Other constipation: Secondary | ICD-10-CM | POA: Diagnosis not present

## 2016-07-12 DIAGNOSIS — R110 Nausea: Secondary | ICD-10-CM

## 2016-07-12 DIAGNOSIS — R1013 Epigastric pain: Secondary | ICD-10-CM | POA: Diagnosis not present

## 2016-07-12 MED ORDER — RANITIDINE HCL 150 MG PO TABS: 150.0000 mg | ORAL_TABLET | Freq: Two times a day (BID) | ORAL | 0 refills | Status: DC

## 2016-07-12 NOTE — Progress Notes (Signed)
Name: Nicole Ballard   MRN: [id number]    DOB: Oct 15, 1955   Date:07/12/2016       Progress Note  Subjective  Chief Complaint  Chief Complaint  Patient presents with  . Nausea    for last 3 weeks and gagging and abdominal pain    HPI  Chronic Nausea/Chronic constipation: she sees GI at [facility] - Dr. Atlee Abide, she was given two rounds of Golytile to help with constipation and it helps temporarily, she has chronic nausea also. She states that last bowel movement was over one week ago - mostly watery because he took some Miralax. However nausea is not allowing her to take Miralax. She has started Topamax about 6 weeks ago and the nausea has been worse, she has been drinking sodas again because states water tastes weird while on Topamax. Explained that topamax can cause nausea also. She wants to have x-ray but advised to contact GI, and wean down Topamax to see if nausea improves.    Patient Active Problem List   Diagnosis Date Noted  . Neck pain, musculoskeletal 12/15/2015  . Anxiety about health 09/29/2015  . Abnormal CAT scan 04/11/2015  . Auditory impairment 04/11/2015  . Insomnia, persistent 04/11/2015  . Chronic kidney disease (CKD), stage I 04/11/2015  . Chronic nonmalignant pain 04/11/2015  . Chronic constipation 04/11/2015  . Dyslipidemia 04/11/2015  . Fibromyalgia 04/11/2015  . Gastro-esophageal reflux disease without esophagitis 04/11/2015  . Dysphonia 04/11/2015  . Absence of bladder continence 04/11/2015  . LBP (low back pain) 04/11/2015  . Eczema intertrigo 04/11/2015  . Keratosis pilaris 04/11/2015  . Chronic recurrent major depressive disorder ([place]) 04/11/2015  . Dysmetabolic syndrome 00/86/7619  . Neurogenic claudication 04/11/2015  . Perennial allergic rhinitis with seasonal variation 04/11/2015  . Abnormal presence of protein in urine 04/11/2015  . Seborrhea capitis 04/11/2015  . Moderate dysplasia of vulva 04/11/2015  . Vitamin D deficiency 04/11/2015  .  Treadmill stress test negative for angina pectoris 03/05/2015  . Shortness of breath 05/20/2014  . Morbid obesity ([place]) 02/03/2014  . Asthma, chronic 12/31/2013  . H/O total knee replacement 12/31/2013  . Episcleritis 09/26/2013  . Vocal cord dysfunction 05/28/2013  . Anxiety, generalized 03/19/2013  . Back pain 12/18/2012  . Pulmonary nodules 11/26/2012  . Lung nodule, multiple 11/26/2012  . Arthritis, degenerative 11/01/2012  . H/O aspiration pneumonitis 10/22/2012  . Postinflammatory pulmonary fibrosis ([place]) 10/22/2012  . Mixed incontinence 05/04/2012  . Benign neoplasm of pituitary gland and craniopharyngeal duct ([place]) 12/29/2010  . Lupus (systemic lupus erythematosus) ([place]) 11/05/2010  . Chronic nausea 07/01/2008  . Benign hypertension 01/25/2005    Past Surgical History:  Procedure Laterality Date  . BRAIN SURGERY    . CHOLECYSTECTOMY    . VAGINAL HYSTERECTOMY      Family History  Problem Relation Age of Onset  . Breast cancer Mother   . Cancer Mother 49    Breast  . Cancer Father 7    Stomach  . Breast cancer Maternal Aunt     Social History   Social History  . Marital status: Divorced    Spouse name: N/A  . Number of children: N/A  . Years of education: N/A   Occupational History  . Not on file.   Social History Main Topics  . Smoking status: Never Smoker  . Smokeless tobacco: Never Used  . Alcohol use No  . Drug use: No  . Sexual activity: Not Currently   Other Topics Concern  . Not on  file   Social History Narrative  . No narrative on file     Current Outpatient Prescriptions:  .  albuterol (PROAIR HFA) 108 (90 BASE) MCG/ACT inhaler, Inhale 2 puffs into the lungs every 6 (six) hours as needed for wheezing. DX code R06.02, Disp: 3 Inhaler, Rfl: 3 .  buPROPion (WELLBUTRIN XL) 150 MG 24 hr tablet, Take 1 tablet by mouth daily. Michiel Cowboy, Disp: , Rfl:  .  Cholecalciferol (VITAMIN D) 2000 UNITS CAPS, Take 1 capsule by mouth daily., Disp: , Rfl:   .  clonazePAM (KLONOPIN) 0.5 MG tablet, Take 0.5 tablets (0.25 mg total) by mouth daily., Disp: 15 tablet, Rfl: 0 .  cyclobenzaprine (FLEXERIL) 5 MG tablet, Take 1 tablet (5 mg total) by mouth every 8 (eight) hours as needed for muscle spasms. Do not drive for 8 hours, Disp: 21 tablet, Rfl: 0 .  diclofenac sodium (VOLTAREN) 1 % GEL, APP 2 GRAMS EXT AA QID, Disp: , Rfl: 6 .  docusate sodium (COLACE) 100 MG capsule, Take 2 capsules (200 mg total) by mouth 2 (two) times daily., Disp: 120 capsule, Rfl: 0 .  DULoxetine (CYMBALTA) 60 MG capsule, Take 120 mg by mouth at bedtime. , Disp: , Rfl:  .  Estradiol 10 MCG TABS vaginal tablet, Place 0.01 mg vaginally., Disp: , Rfl:  .  FLOVENT HFA 110 MCG/ACT inhaler, INHALE 2 PUFFS PO BID, Disp: , Rfl: 0 .  fluticasone (FLONASE) 50 MCG/ACT nasal spray, Use 2 sprays in each  nostril daily, Disp: 48 g, Rfl: 0 .  Glycerin, Laxative, (RA GLYCERIN ADULT) 80.7 % SUPP, Place rectally., Disp: , Rfl:  .  hydroxychloroquine (PLAQUENIL) 200 MG tablet, Take 200 mg by mouth 2 (two) times daily., Disp: , Rfl:  .  lamoTRIgine (LAMICTAL) 100 MG tablet, Take 1 tablet by mouth 3 (three) times daily., Disp: , Rfl:  .  losartan (COZAAR) 25 MG tablet, Take 1 tablet (25 mg total) by mouth daily., Disp: 90 tablet, Rfl: 0 .  meclizine (ANTIVERT) 25 MG tablet, Take 1 tablet (25 mg total) by mouth 3 (three) times daily as needed., Disp: 30 tablet, Rfl: 0 .  Multiple Vitamin (MULTIVITAMIN) tablet, Take 1 tablet by mouth daily., Disp: , Rfl:  .  omeprazole (PRILOSEC) 40 MG capsule, TAKE 1 CAPSULE BY MOUTH  DAILY, Disp: 90 capsule, Rfl: 1 .  ondansetron (ZOFRAN) 4 MG tablet, Take 1 tablet (4 mg total) by mouth every 8 (eight) hours as needed for nausea or vomiting., Disp: 180 tablet, Rfl: 0 .  polyethylene glycol powder (GLYCOLAX/MIRALAX) powder, 2 cap fulls in a full glass of water, three times a day, for 5 days. (Patient not taking: Reported on 08/06/2015), Disp: 255 g, Rfl: 0 .   topiramate (TOPAMAX) 50 MG tablet, Take 1 tablet by mouth 3 (three) times daily. Dr. [place] James - Pain management, Disp: , Rfl:  .  traZODone (DESYREL) 150 MG tablet, Take 150 mg by mouth at bedtime., Disp: , Rfl:  .  Vitamin D, Ergocalciferol, (DRISDOL) 50000 units CAPS capsule, Take 1 capsule (50,000 Units total) by mouth every 7 (seven) days., Disp: 12 capsule, Rfl: 0  Allergies  Allergen Reactions  . Ace Inhibitors     Other reaction(s): OTHER Pt states she can not take ace inhibitors. Pt states she can not remember her reaction.   Marland Kitchen Penicillins      ROS  Ten systems reviewed and is negative except as mentioned in HPI   Objective  Vitals:   07/12/16  1009  BP: 128/78  Pulse: (!) 110  Resp: 16  Temp: 98.4 F (36.9 C)  TempSrc: Oral  SpO2: 95%  Weight: 239 lb 7 oz (108.6 kg)  Height: '5\' 8"'$  (1.727 m)    Body mass index is 36.41 kg/m.  Physical Exam  Constitutional: Patient appears well-developed and well-nourished. Obese  No distress.  HEENT: head atraumatic, normocephalic, pupils equal and reactive to light, neck supple, throat within normal limits Cardiovascular: Normal rate, regular rhythm and normal heart sounds.  No murmur heard. No BLE edema. Pulmonary/Chest: Effort normal and breath sounds normal. No respiratory distress. Abdominal: Soft.  There is tenderness, mild diffuse, but a little worse on epigastric pain, no guarding or rebound, normal bowel sounds Psychiatric: Patient has a normal mood and affect. behavior is normal. Judgment and thought content normal.  Recent Results (from the past 2160 hour(s))  Lipid panel     Status: None   Collection Time: 04/29/16 12:17 PM  Result Value Ref Range   Cholesterol 184 125 - 200 mg/dL   Triglycerides 72 <150 mg/dL   HDL 55 >=46 mg/dL   Total CHOL/HDL Ratio 3.3 <=5.0 Ratio   VLDL 14 <30 mg/dL   LDL Cholesterol 115 <130 mg/dL    Comment:   Total Cholesterol/HDL Ratio:CHD Risk                        Coronary  Heart Disease Risk Table                                        Men       Women          1/2 Average Risk              3.4        3.3              Average Risk              5.0        4.4           2X Average Risk              9.6        7.1           3X Average Risk             23.4       11.0 Use the calculated Patient Ratio above and the CHD Risk table  to determine the patient's CHD Risk.   Hemoglobin A1c     Status: None   Collection Time: 04/29/16 12:17 PM  Result Value Ref Range   Hgb A1c MFr Bld 5.5 <5.7 %    Comment:   For the purpose of screening for the presence of diabetes:   <5.7%       Consistent with the absence of diabetes 5.7-6.4 %   Consistent with increased risk for diabetes (prediabetes) >=6.5 %     Consistent with diabetes   This assay result is consistent with a decreased risk of diabetes.   Currently, no consensus exists regarding use of hemoglobin A1c for diagnosis of diabetes in children.   According to American Diabetes Association (ADA) guidelines, hemoglobin A1c <7.0% represents optimal control in non-pregnant diabetic patients. Different metrics may apply to specific patient populations. Standards of Medical Care in Diabetes (ADA).  Mean Plasma Glucose 111 mg/dL  COMPLETE METABOLIC PANEL WITH GFR     Status: None   Collection Time: 04/29/16 12:17 PM  Result Value Ref Range   Sodium 140 135 - 146 mmol/L   Potassium 5.2 3.5 - 5.3 mmol/L   Chloride 104 98 - 110 mmol/L   CO2 27 20 - 31 mmol/L   Glucose, Bld 86 65 - 99 mg/dL   BUN 7 7 - 25 mg/dL   Creat 0.86 0.50 - 1.05 mg/dL    Comment:   For patients > or = 58 years of age: The upper reference limit for Creatinine is approximately 13% higher for people identified as African-American.      Total Bilirubin 0.3 0.2 - 1.2 mg/dL   Alkaline Phosphatase 74 33 - 130 U/L   AST 21 10 - 35 U/L   ALT 14 6 - 29 U/L   Total Protein 8.0 6.1 - 8.1 g/dL   Albumin 3.6 3.6 - 5.1 g/dL   Calcium 9.7 8.6  - 10.4 mg/dL   GFR, Est African American 86 >=60 mL/min   GFR, Est Non African American 75 >=60 mL/min  CBC with Differential/Platelet     Status: None   Collection Time: 04/29/16 12:17 PM  Result Value Ref Range   WBC 4.4 3.8 - 10.8 K/uL   RBC 4.84 3.80 - 5.10 MIL/uL   Hemoglobin 13.4 11.7 - 15.5 g/dL   HCT 40.7 35.0 - 45.0 %   MCV 84.1 80.0 - 100.0 fL   MCH 27.7 27.0 - 33.0 pg   MCHC 32.9 32.0 - 36.0 g/dL   RDW 14.6 11.0 - 15.0 %   Platelets 349 140 - 400 K/uL   MPV 9.7 7.5 - 12.5 fL   Neutro Abs 2,068 1,500 - 7,800 cells/uL   Lymphs Abs 1,672 850 - 3,900 cells/uL   Monocytes Absolute 484 200 - 950 cells/uL   Eosinophils Absolute 132 15 - 500 cells/uL   Basophils Absolute 44 0 - 200 cells/uL   Neutrophils Relative % 47 %   Lymphocytes Relative 38 %   Monocytes Relative 11 %   Eosinophils Relative 3 %   Basophils Relative 1 %   Smear Review Criteria for review not met   VITAMIN D 25 Hydroxy (Vit-D Deficiency, Fractures)     Status: Abnormal   Collection Time: 04/29/16 12:17 PM  Result Value Ref Range   Vit D, 25-Hydroxy 24 (L) 30 - 100 ng/mL    Comment: Vitamin D Status           25-OH Vitamin D        Deficiency                <20 ng/mL        Insufficiency         20 - 29 ng/mL        Optimal             > or = 30 ng/mL   For 25-OH Vitamin D testing on patients on D2-supplementation and patients for whom quantitation of D2 and D3 fractions is required, the QuestAssureD 25-OH VIT D, (D2,D3), LC/MS/MS is recommended: order code [id number] (patients > 2 yrs).      PHQ2/9: Depression screen [facility] 2/9 07/12/2016 04/29/2016 01/28/2016 12/30/2015 12/15/2015  Decreased Interest 0 0 0 0 0  Down, Depressed, Hopeless 0 1 0 0 0  PHQ - 2 Score 0 1 0 0 0     Fall Risk:  Fall Risk  07/12/2016 04/29/2016 01/28/2016 12/30/2015 12/15/2015  Falls in the past year? No No No No No  Number falls in past yr: - - - - -  Injury with Fall? - - - - -    Functional Status Survey: Is the patient deaf  or have difficulty hearing?: No Does the patient have difficulty seeing, even when wearing glasses/contacts?: No Does the patient have difficulty concentrating, remembering, or making decisions?: No Does the patient have difficulty walking or climbing stairs?: No Does the patient have difficulty dressing or bathing?: No Does the patient have difficulty doing errands alone such as visiting a doctor's office or shopping?: No    Assessment & Plan   1. Chronic nausea  Likely worse from new prescription of Topamax, and she will try to wean off slowly and see if symptoms gets back to baseline. She is also drinking sodas again since started on medication and explained that it may cause gastritis and needs to try to stop it and drink water instead. Another cause of worsening of nausea is constipation and advised her to contact GI to have a refill of Golytely   2. Chronic constipation  Contact GI  3. Epigastric pain  Possible gastritis, we will try avoiding sodas, start Ranitidine, resume water , and try to have a bowel movement  - ranitidine (ZANTAC) 150 MG tablet; Take 1 tablet (150 mg total) by mouth 2 (two) times daily.  Dispense: 60 tablet; Refill: 0

## 2016-07-13 DIAGNOSIS — Z7952 Long term (current) use of systemic steroids: Secondary | ICD-10-CM | POA: Diagnosis not present

## 2016-07-13 DIAGNOSIS — M545 Low back pain: Secondary | ICD-10-CM | POA: Diagnosis not present

## 2016-07-13 DIAGNOSIS — I129 Hypertensive chronic kidney disease with stage 1 through stage 4 chronic kidney disease, or unspecified chronic kidney disease: Secondary | ICD-10-CM | POA: Diagnosis not present

## 2016-07-13 DIAGNOSIS — J45909 Unspecified asthma, uncomplicated: Secondary | ICD-10-CM | POA: Diagnosis not present

## 2016-07-13 DIAGNOSIS — H905 Unspecified sensorineural hearing loss: Secondary | ICD-10-CM | POA: Diagnosis not present

## 2016-07-13 DIAGNOSIS — R05 Cough: Secondary | ICD-10-CM | POA: Diagnosis not present

## 2016-07-13 DIAGNOSIS — Z7951 Long term (current) use of inhaled steroids: Secondary | ICD-10-CM | POA: Diagnosis not present

## 2016-07-13 DIAGNOSIS — R531 Weakness: Secondary | ICD-10-CM | POA: Diagnosis not present

## 2016-07-13 DIAGNOSIS — M797 Fibromyalgia: Secondary | ICD-10-CM | POA: Diagnosis not present

## 2016-07-13 DIAGNOSIS — Z96651 Presence of right artificial knee joint: Secondary | ICD-10-CM | POA: Diagnosis not present

## 2016-07-13 DIAGNOSIS — K5909 Other constipation: Secondary | ICD-10-CM | POA: Diagnosis not present

## 2016-07-13 DIAGNOSIS — M1990 Unspecified osteoarthritis, unspecified site: Secondary | ICD-10-CM | POA: Diagnosis not present

## 2016-07-13 DIAGNOSIS — Z880 Allergy status to penicillin: Secondary | ICD-10-CM | POA: Diagnosis not present

## 2016-07-13 DIAGNOSIS — R0602 Shortness of breath: Secondary | ICD-10-CM | POA: Diagnosis not present

## 2016-07-13 DIAGNOSIS — Z9181 History of falling: Secondary | ICD-10-CM | POA: Diagnosis not present

## 2016-07-13 DIAGNOSIS — Z79899 Other long term (current) drug therapy: Secondary | ICD-10-CM | POA: Diagnosis not present

## 2016-07-13 DIAGNOSIS — N181 Chronic kidney disease, stage 1: Secondary | ICD-10-CM | POA: Diagnosis not present

## 2016-07-13 DIAGNOSIS — M329 Systemic lupus erythematosus, unspecified: Secondary | ICD-10-CM | POA: Diagnosis not present

## 2016-07-13 DIAGNOSIS — G8929 Other chronic pain: Secondary | ICD-10-CM | POA: Diagnosis not present

## 2016-07-13 DIAGNOSIS — M549 Dorsalgia, unspecified: Secondary | ICD-10-CM | POA: Diagnosis not present

## 2016-07-13 DIAGNOSIS — Z7409 Other reduced mobility: Secondary | ICD-10-CM | POA: Diagnosis not present

## 2016-07-13 DIAGNOSIS — M25561 Pain in right knee: Secondary | ICD-10-CM | POA: Diagnosis not present

## 2016-07-20 DIAGNOSIS — Z880 Allergy status to penicillin: Secondary | ICD-10-CM | POA: Diagnosis not present

## 2016-07-20 DIAGNOSIS — G8929 Other chronic pain: Secondary | ICD-10-CM | POA: Diagnosis not present

## 2016-07-20 DIAGNOSIS — R531 Weakness: Secondary | ICD-10-CM | POA: Diagnosis not present

## 2016-07-20 DIAGNOSIS — Z7951 Long term (current) use of inhaled steroids: Secondary | ICD-10-CM | POA: Diagnosis not present

## 2016-07-20 DIAGNOSIS — I129 Hypertensive chronic kidney disease with stage 1 through stage 4 chronic kidney disease, or unspecified chronic kidney disease: Secondary | ICD-10-CM | POA: Diagnosis not present

## 2016-07-20 DIAGNOSIS — Z79899 Other long term (current) drug therapy: Secondary | ICD-10-CM | POA: Diagnosis not present

## 2016-07-20 DIAGNOSIS — M25561 Pain in right knee: Secondary | ICD-10-CM | POA: Diagnosis not present

## 2016-07-20 DIAGNOSIS — Z7409 Other reduced mobility: Secondary | ICD-10-CM | POA: Diagnosis not present

## 2016-07-20 DIAGNOSIS — M797 Fibromyalgia: Secondary | ICD-10-CM | POA: Diagnosis not present

## 2016-07-20 DIAGNOSIS — M329 Systemic lupus erythematosus, unspecified: Secondary | ICD-10-CM | POA: Diagnosis not present

## 2016-07-20 DIAGNOSIS — H905 Unspecified sensorineural hearing loss: Secondary | ICD-10-CM | POA: Diagnosis not present

## 2016-07-20 DIAGNOSIS — Z9181 History of falling: Secondary | ICD-10-CM | POA: Diagnosis not present

## 2016-07-20 DIAGNOSIS — M545 Low back pain: Secondary | ICD-10-CM | POA: Diagnosis not present

## 2016-07-20 DIAGNOSIS — R05 Cough: Secondary | ICD-10-CM | POA: Diagnosis not present

## 2016-07-20 DIAGNOSIS — M549 Dorsalgia, unspecified: Secondary | ICD-10-CM | POA: Diagnosis not present

## 2016-07-20 DIAGNOSIS — J45909 Unspecified asthma, uncomplicated: Secondary | ICD-10-CM | POA: Diagnosis not present

## 2016-07-20 DIAGNOSIS — R0602 Shortness of breath: Secondary | ICD-10-CM | POA: Diagnosis not present

## 2016-07-20 DIAGNOSIS — N181 Chronic kidney disease, stage 1: Secondary | ICD-10-CM | POA: Diagnosis not present

## 2016-07-20 DIAGNOSIS — K5909 Other constipation: Secondary | ICD-10-CM | POA: Diagnosis not present

## 2016-07-20 DIAGNOSIS — Z7952 Long term (current) use of systemic steroids: Secondary | ICD-10-CM | POA: Diagnosis not present

## 2016-07-20 DIAGNOSIS — M1990 Unspecified osteoarthritis, unspecified site: Secondary | ICD-10-CM | POA: Diagnosis not present

## 2016-07-20 DIAGNOSIS — Z96651 Presence of right artificial knee joint: Secondary | ICD-10-CM | POA: Diagnosis not present

## 2016-07-21 DIAGNOSIS — R531 Weakness: Secondary | ICD-10-CM | POA: Diagnosis not present

## 2016-07-21 DIAGNOSIS — R0602 Shortness of breath: Secondary | ICD-10-CM | POA: Diagnosis not present

## 2016-07-21 DIAGNOSIS — M797 Fibromyalgia: Secondary | ICD-10-CM | POA: Diagnosis not present

## 2016-07-21 DIAGNOSIS — M549 Dorsalgia, unspecified: Secondary | ICD-10-CM | POA: Diagnosis not present

## 2016-07-21 DIAGNOSIS — H905 Unspecified sensorineural hearing loss: Secondary | ICD-10-CM | POA: Diagnosis not present

## 2016-07-21 DIAGNOSIS — Z7409 Other reduced mobility: Secondary | ICD-10-CM | POA: Diagnosis not present

## 2016-07-21 DIAGNOSIS — Z7952 Long term (current) use of systemic steroids: Secondary | ICD-10-CM | POA: Diagnosis not present

## 2016-07-21 DIAGNOSIS — Z96651 Presence of right artificial knee joint: Secondary | ICD-10-CM | POA: Diagnosis not present

## 2016-07-21 DIAGNOSIS — Z880 Allergy status to penicillin: Secondary | ICD-10-CM | POA: Diagnosis not present

## 2016-07-21 DIAGNOSIS — J45909 Unspecified asthma, uncomplicated: Secondary | ICD-10-CM | POA: Diagnosis not present

## 2016-07-21 DIAGNOSIS — I129 Hypertensive chronic kidney disease with stage 1 through stage 4 chronic kidney disease, or unspecified chronic kidney disease: Secondary | ICD-10-CM | POA: Diagnosis not present

## 2016-07-21 DIAGNOSIS — Z9181 History of falling: Secondary | ICD-10-CM | POA: Diagnosis not present

## 2016-07-21 DIAGNOSIS — M1990 Unspecified osteoarthritis, unspecified site: Secondary | ICD-10-CM | POA: Diagnosis not present

## 2016-07-21 DIAGNOSIS — N181 Chronic kidney disease, stage 1: Secondary | ICD-10-CM | POA: Diagnosis not present

## 2016-07-21 DIAGNOSIS — M545 Low back pain: Secondary | ICD-10-CM | POA: Diagnosis not present

## 2016-07-21 DIAGNOSIS — M25561 Pain in right knee: Secondary | ICD-10-CM | POA: Diagnosis not present

## 2016-07-21 DIAGNOSIS — G8929 Other chronic pain: Secondary | ICD-10-CM | POA: Diagnosis not present

## 2016-07-21 DIAGNOSIS — R05 Cough: Secondary | ICD-10-CM | POA: Diagnosis not present

## 2016-07-21 DIAGNOSIS — Z7951 Long term (current) use of inhaled steroids: Secondary | ICD-10-CM | POA: Diagnosis not present

## 2016-07-21 DIAGNOSIS — Z79899 Other long term (current) drug therapy: Secondary | ICD-10-CM | POA: Diagnosis not present

## 2016-07-21 DIAGNOSIS — M329 Systemic lupus erythematosus, unspecified: Secondary | ICD-10-CM | POA: Diagnosis not present

## 2016-07-21 DIAGNOSIS — K5909 Other constipation: Secondary | ICD-10-CM | POA: Diagnosis not present

## 2016-07-26 ENCOUNTER — Other Ambulatory Visit: Payer: Self-pay | Admitting: Family Medicine

## 2016-07-29 ENCOUNTER — Other Ambulatory Visit: Payer: Self-pay | Admitting: Family Medicine

## 2016-08-01 ENCOUNTER — Ambulatory Visit: Payer: Medicare Other | Admitting: Family Medicine

## 2016-08-01 ENCOUNTER — Other Ambulatory Visit: Payer: Self-pay | Admitting: Family Medicine

## 2016-08-01 DIAGNOSIS — K5909 Other constipation: Secondary | ICD-10-CM | POA: Diagnosis not present

## 2016-08-01 NOTE — Telephone Encounter (Signed)
Patient requesting refill of Losartan to [facility].  

## 2016-08-04 ENCOUNTER — Telehealth: Payer: Self-pay | Admitting: Family Medicine

## 2016-08-04 DIAGNOSIS — Z23 Encounter for immunization: Secondary | ICD-10-CM | POA: Diagnosis not present

## 2016-08-04 DIAGNOSIS — M3219 Other organ or system involvement in systemic lupus erythematosus: Secondary | ICD-10-CM | POA: Diagnosis not present

## 2016-08-04 DIAGNOSIS — N181 Chronic kidney disease, stage 1: Secondary | ICD-10-CM | POA: Diagnosis not present

## 2016-08-04 DIAGNOSIS — J82 Pulmonary eosinophilia, not elsewhere classified: Secondary | ICD-10-CM | POA: Diagnosis not present

## 2016-08-04 DIAGNOSIS — E785 Hyperlipidemia, unspecified: Secondary | ICD-10-CM | POA: Diagnosis not present

## 2016-08-04 DIAGNOSIS — Z79899 Other long term (current) drug therapy: Secondary | ICD-10-CM | POA: Diagnosis not present

## 2016-08-04 DIAGNOSIS — M329 Systemic lupus erythematosus, unspecified: Secondary | ICD-10-CM | POA: Diagnosis not present

## 2016-08-04 DIAGNOSIS — I129 Hypertensive chronic kidney disease with stage 1 through stage 4 chronic kidney disease, or unspecified chronic kidney disease: Secondary | ICD-10-CM | POA: Diagnosis not present

## 2016-08-04 DIAGNOSIS — Z7952 Long term (current) use of systemic steroids: Secondary | ICD-10-CM | POA: Diagnosis not present

## 2016-08-04 DIAGNOSIS — M1990 Unspecified osteoarthritis, unspecified site: Secondary | ICD-10-CM | POA: Diagnosis not present

## 2016-08-04 DIAGNOSIS — M797 Fibromyalgia: Secondary | ICD-10-CM | POA: Diagnosis not present

## 2016-08-04 NOTE — Telephone Encounter (Signed)
I am not familiar with detox tea. She can do it a couple hours after her morning medication

## 2016-08-04 NOTE — Telephone Encounter (Signed)
Pt stated that her GI provider told her she start taking detox tea, but patient wanted to know if this would be ok being that she is on a lot of medications.  Patient wanted to make sure that if she were to take the detox tea that it will deplete the medications out of her body.

## 2016-08-05 NOTE — Telephone Encounter (Signed)
Patient notified

## 2016-08-12 ENCOUNTER — Ambulatory Visit: Payer: Medicare Other | Admitting: Family Medicine

## 2016-08-23 DIAGNOSIS — J82 Pulmonary eosinophilia, not elsewhere classified: Secondary | ICD-10-CM | POA: Diagnosis not present

## 2016-08-23 DIAGNOSIS — J410 Simple chronic bronchitis: Secondary | ICD-10-CM | POA: Diagnosis not present

## 2016-08-27 DIAGNOSIS — R0602 Shortness of breath: Secondary | ICD-10-CM | POA: Diagnosis not present

## 2016-08-27 DIAGNOSIS — E785 Hyperlipidemia, unspecified: Secondary | ICD-10-CM | POA: Diagnosis not present

## 2016-08-27 DIAGNOSIS — N181 Chronic kidney disease, stage 1: Secondary | ICD-10-CM | POA: Diagnosis not present

## 2016-08-27 DIAGNOSIS — I129 Hypertensive chronic kidney disease with stage 1 through stage 4 chronic kidney disease, or unspecified chronic kidney disease: Secondary | ICD-10-CM | POA: Diagnosis not present

## 2016-08-27 DIAGNOSIS — Z7951 Long term (current) use of inhaled steroids: Secondary | ICD-10-CM | POA: Diagnosis not present

## 2016-08-27 DIAGNOSIS — E559 Vitamin D deficiency, unspecified: Secondary | ICD-10-CM | POA: Diagnosis not present

## 2016-08-27 DIAGNOSIS — R1110 Vomiting, unspecified: Secondary | ICD-10-CM | POA: Diagnosis not present

## 2016-08-27 DIAGNOSIS — K219 Gastro-esophageal reflux disease without esophagitis: Secondary | ICD-10-CM | POA: Diagnosis not present

## 2016-08-27 DIAGNOSIS — R05 Cough: Secondary | ICD-10-CM | POA: Diagnosis not present

## 2016-08-27 DIAGNOSIS — E1122 Type 2 diabetes mellitus with diabetic chronic kidney disease: Secondary | ICD-10-CM | POA: Diagnosis not present

## 2016-08-27 DIAGNOSIS — R51 Headache: Secondary | ICD-10-CM | POA: Diagnosis not present

## 2016-08-27 DIAGNOSIS — R0989 Other specified symptoms and signs involving the circulatory and respiratory systems: Secondary | ICD-10-CM | POA: Diagnosis not present

## 2016-08-27 DIAGNOSIS — M329 Systemic lupus erythematosus, unspecified: Secondary | ICD-10-CM | POA: Diagnosis not present

## 2016-08-27 DIAGNOSIS — J849 Interstitial pulmonary disease, unspecified: Secondary | ICD-10-CM | POA: Diagnosis not present

## 2016-08-27 DIAGNOSIS — Z79899 Other long term (current) drug therapy: Secondary | ICD-10-CM | POA: Diagnosis not present

## 2016-08-27 DIAGNOSIS — Z86011 Personal history of benign neoplasm of the brain: Secondary | ICD-10-CM | POA: Diagnosis not present

## 2016-08-27 DIAGNOSIS — R42 Dizziness and giddiness: Secondary | ICD-10-CM | POA: Diagnosis not present

## 2016-09-07 DIAGNOSIS — I129 Hypertensive chronic kidney disease with stage 1 through stage 4 chronic kidney disease, or unspecified chronic kidney disease: Secondary | ICD-10-CM | POA: Diagnosis not present

## 2016-09-07 DIAGNOSIS — M328 Other forms of systemic lupus erythematosus: Secondary | ICD-10-CM | POA: Diagnosis not present

## 2016-09-07 DIAGNOSIS — K5900 Constipation, unspecified: Secondary | ICD-10-CM | POA: Diagnosis not present

## 2016-09-07 DIAGNOSIS — R809 Proteinuria, unspecified: Secondary | ICD-10-CM | POA: Diagnosis not present

## 2016-09-07 DIAGNOSIS — N181 Chronic kidney disease, stage 1: Secondary | ICD-10-CM | POA: Diagnosis not present

## 2016-09-09 DIAGNOSIS — R918 Other nonspecific abnormal finding of lung field: Secondary | ICD-10-CM | POA: Diagnosis not present

## 2016-09-09 DIAGNOSIS — J82 Pulmonary eosinophilia, not elsewhere classified: Secondary | ICD-10-CM | POA: Diagnosis not present

## 2016-09-13 DIAGNOSIS — M1990 Unspecified osteoarthritis, unspecified site: Secondary | ICD-10-CM | POA: Diagnosis not present

## 2016-09-13 DIAGNOSIS — M545 Low back pain: Secondary | ICD-10-CM | POA: Diagnosis not present

## 2016-09-13 DIAGNOSIS — M797 Fibromyalgia: Secondary | ICD-10-CM | POA: Diagnosis not present

## 2016-09-13 DIAGNOSIS — M25561 Pain in right knee: Secondary | ICD-10-CM | POA: Diagnosis not present

## 2016-09-13 DIAGNOSIS — R531 Weakness: Secondary | ICD-10-CM | POA: Diagnosis not present

## 2016-09-13 DIAGNOSIS — Z7952 Long term (current) use of systemic steroids: Secondary | ICD-10-CM | POA: Diagnosis not present

## 2016-09-13 DIAGNOSIS — I129 Hypertensive chronic kidney disease with stage 1 through stage 4 chronic kidney disease, or unspecified chronic kidney disease: Secondary | ICD-10-CM | POA: Diagnosis not present

## 2016-09-13 DIAGNOSIS — H905 Unspecified sensorineural hearing loss: Secondary | ICD-10-CM | POA: Diagnosis not present

## 2016-09-13 DIAGNOSIS — Z7409 Other reduced mobility: Secondary | ICD-10-CM | POA: Diagnosis not present

## 2016-09-13 DIAGNOSIS — Z79899 Other long term (current) drug therapy: Secondary | ICD-10-CM | POA: Diagnosis not present

## 2016-09-13 DIAGNOSIS — Z9181 History of falling: Secondary | ICD-10-CM | POA: Diagnosis not present

## 2016-09-13 DIAGNOSIS — J45909 Unspecified asthma, uncomplicated: Secondary | ICD-10-CM | POA: Diagnosis not present

## 2016-09-13 DIAGNOSIS — Z880 Allergy status to penicillin: Secondary | ICD-10-CM | POA: Diagnosis not present

## 2016-09-13 DIAGNOSIS — R942 Abnormal results of pulmonary function studies: Secondary | ICD-10-CM | POA: Diagnosis not present

## 2016-09-13 DIAGNOSIS — M329 Systemic lupus erythematosus, unspecified: Secondary | ICD-10-CM | POA: Diagnosis not present

## 2016-09-13 DIAGNOSIS — N181 Chronic kidney disease, stage 1: Secondary | ICD-10-CM | POA: Diagnosis not present

## 2016-09-13 DIAGNOSIS — G8929 Other chronic pain: Secondary | ICD-10-CM | POA: Diagnosis not present

## 2016-09-13 DIAGNOSIS — J82 Pulmonary eosinophilia, not elsewhere classified: Secondary | ICD-10-CM | POA: Diagnosis not present

## 2016-09-13 DIAGNOSIS — R0602 Shortness of breath: Secondary | ICD-10-CM | POA: Diagnosis not present

## 2016-09-13 DIAGNOSIS — R05 Cough: Secondary | ICD-10-CM | POA: Diagnosis not present

## 2016-09-13 DIAGNOSIS — Z7951 Long term (current) use of inhaled steroids: Secondary | ICD-10-CM | POA: Diagnosis not present

## 2016-09-13 DIAGNOSIS — K5909 Other constipation: Secondary | ICD-10-CM | POA: Diagnosis not present

## 2016-09-13 DIAGNOSIS — Z96651 Presence of right artificial knee joint: Secondary | ICD-10-CM | POA: Diagnosis not present

## 2016-09-15 DIAGNOSIS — Z7951 Long term (current) use of inhaled steroids: Secondary | ICD-10-CM | POA: Diagnosis not present

## 2016-09-15 DIAGNOSIS — R531 Weakness: Secondary | ICD-10-CM | POA: Diagnosis not present

## 2016-09-15 DIAGNOSIS — Z880 Allergy status to penicillin: Secondary | ICD-10-CM | POA: Diagnosis not present

## 2016-09-15 DIAGNOSIS — M25561 Pain in right knee: Secondary | ICD-10-CM | POA: Diagnosis not present

## 2016-09-15 DIAGNOSIS — J45909 Unspecified asthma, uncomplicated: Secondary | ICD-10-CM | POA: Diagnosis not present

## 2016-09-15 DIAGNOSIS — I129 Hypertensive chronic kidney disease with stage 1 through stage 4 chronic kidney disease, or unspecified chronic kidney disease: Secondary | ICD-10-CM | POA: Diagnosis not present

## 2016-09-15 DIAGNOSIS — R05 Cough: Secondary | ICD-10-CM | POA: Diagnosis not present

## 2016-09-15 DIAGNOSIS — Z96651 Presence of right artificial knee joint: Secondary | ICD-10-CM | POA: Diagnosis not present

## 2016-09-15 DIAGNOSIS — Z79899 Other long term (current) drug therapy: Secondary | ICD-10-CM | POA: Diagnosis not present

## 2016-09-15 DIAGNOSIS — Z7952 Long term (current) use of systemic steroids: Secondary | ICD-10-CM | POA: Diagnosis not present

## 2016-09-15 DIAGNOSIS — M1990 Unspecified osteoarthritis, unspecified site: Secondary | ICD-10-CM | POA: Diagnosis not present

## 2016-09-15 DIAGNOSIS — M797 Fibromyalgia: Secondary | ICD-10-CM | POA: Diagnosis not present

## 2016-09-15 DIAGNOSIS — G8929 Other chronic pain: Secondary | ICD-10-CM | POA: Diagnosis not present

## 2016-09-15 DIAGNOSIS — Z9181 History of falling: Secondary | ICD-10-CM | POA: Diagnosis not present

## 2016-09-15 DIAGNOSIS — R0602 Shortness of breath: Secondary | ICD-10-CM | POA: Diagnosis not present

## 2016-09-15 DIAGNOSIS — H905 Unspecified sensorineural hearing loss: Secondary | ICD-10-CM | POA: Diagnosis not present

## 2016-09-15 DIAGNOSIS — N181 Chronic kidney disease, stage 1: Secondary | ICD-10-CM | POA: Diagnosis not present

## 2016-09-15 DIAGNOSIS — M545 Low back pain: Secondary | ICD-10-CM | POA: Diagnosis not present

## 2016-09-15 DIAGNOSIS — M329 Systemic lupus erythematosus, unspecified: Secondary | ICD-10-CM | POA: Diagnosis not present

## 2016-09-15 DIAGNOSIS — K5909 Other constipation: Secondary | ICD-10-CM | POA: Diagnosis not present

## 2016-09-15 DIAGNOSIS — Z7409 Other reduced mobility: Secondary | ICD-10-CM | POA: Diagnosis not present

## 2016-09-20 DIAGNOSIS — M329 Systemic lupus erythematosus, unspecified: Secondary | ICD-10-CM | POA: Diagnosis not present

## 2016-09-20 DIAGNOSIS — N189 Chronic kidney disease, unspecified: Secondary | ICD-10-CM | POA: Diagnosis not present

## 2016-09-20 DIAGNOSIS — Z888 Allergy status to other drugs, medicaments and biological substances status: Secondary | ICD-10-CM | POA: Diagnosis not present

## 2016-09-20 DIAGNOSIS — M797 Fibromyalgia: Secondary | ICD-10-CM | POA: Diagnosis not present

## 2016-09-20 DIAGNOSIS — Z880 Allergy status to penicillin: Secondary | ICD-10-CM | POA: Diagnosis not present

## 2016-09-20 DIAGNOSIS — Z79899 Other long term (current) drug therapy: Secondary | ICD-10-CM | POA: Diagnosis not present

## 2016-09-20 DIAGNOSIS — K219 Gastro-esophageal reflux disease without esophagitis: Secondary | ICD-10-CM | POA: Diagnosis not present

## 2016-09-20 DIAGNOSIS — I129 Hypertensive chronic kidney disease with stage 1 through stage 4 chronic kidney disease, or unspecified chronic kidney disease: Secondary | ICD-10-CM | POA: Diagnosis not present

## 2016-09-20 DIAGNOSIS — Z7952 Long term (current) use of systemic steroids: Secondary | ICD-10-CM | POA: Diagnosis not present

## 2016-09-20 DIAGNOSIS — K5900 Constipation, unspecified: Secondary | ICD-10-CM | POA: Diagnosis not present

## 2016-09-20 DIAGNOSIS — Z713 Dietary counseling and surveillance: Secondary | ICD-10-CM | POA: Diagnosis not present

## 2016-09-29 ENCOUNTER — Encounter: Payer: Self-pay | Admitting: Family Medicine

## 2016-09-29 ENCOUNTER — Ambulatory Visit (INDEPENDENT_AMBULATORY_CARE_PROVIDER_SITE_OTHER): Payer: Medicare Other | Admitting: Family Medicine

## 2016-09-29 VITALS — BP 118/74 | HR 88 | Temp 98.1°F | Resp 16 | Ht 68.0 in | Wt 231.4 lb

## 2016-09-29 DIAGNOSIS — R110 Nausea: Secondary | ICD-10-CM

## 2016-09-29 DIAGNOSIS — R42 Dizziness and giddiness: Secondary | ICD-10-CM | POA: Diagnosis not present

## 2016-09-29 DIAGNOSIS — R002 Palpitations: Secondary | ICD-10-CM | POA: Diagnosis not present

## 2016-09-29 DIAGNOSIS — J8489 Other specified interstitial pulmonary diseases: Secondary | ICD-10-CM | POA: Diagnosis not present

## 2016-09-29 MED ORDER — MECLIZINE HCL 25 MG PO TABS: 25.0000 mg | ORAL_TABLET | Freq: Three times a day (TID) | ORAL | 0 refills | Status: DC | PRN

## 2016-09-29 MED ORDER — ONDANSETRON HCL 4 MG PO TABS: 4.0000 mg | ORAL_TABLET | Freq: Three times a day (TID) | ORAL | 0 refills | Status: DC | PRN

## 2016-09-29 NOTE — Progress Notes (Signed)
Name: Nicole Ballard   MRN: [id number]    DOB: [birth year]   Date:09/29/2016       Progress Note  Subjective  Chief Complaint  Chief Complaint  Patient presents with  . Palpitations    since she has started back taking steroids for about a week  . Dizziness    HPI  Vertigo : she developed acute onset of spinning sensation with head movement and while standing from her chair on 08/27/2016 she fell. Her family took her to [facility] and had a negative head CT, CXR was done and was sent home since symptoms had improved. No falls since, but continues to have spinning sensation ( feels like the room is moving around her ) , she has associated nausea and vomiting ( but no vomiting since Dec ), taking some nausea medication that has been controlling symptoms. She denies ear pain. No weakness. She states her gait is back to normal. She went on a cruise and returned on 08/21/2016   Palpitation: since she started taking prednisone 2 weeks ago from her lungs. She is also taking antibiotics. She is not sure if it is helping with her lungs, but the fluttering sensation on her chest is worrying her. Explained that is a normal side effect of prednisone and since normal EKG, gave her reassurance   Patient Active Problem List   Diagnosis Date Noted  . Neck pain, musculoskeletal 12/15/2015  . Anxiety about health 09/29/2015  . Abnormal CAT scan 04/11/2015  . Auditory impairment 04/11/2015  . Insomnia, persistent 04/11/2015  . Chronic kidney disease (CKD), stage I 04/11/2015  . Chronic nonmalignant pain 04/11/2015  . Chronic constipation 04/11/2015  . Dyslipidemia 04/11/2015  . Fibromyalgia 04/11/2015  . Gastro-esophageal reflux disease without esophagitis 04/11/2015  . Dysphonia 04/11/2015  . Absence of bladder continence 04/11/2015  . LBP (low back pain) 04/11/2015  . Eczema intertrigo 04/11/2015  . Keratosis pilaris 04/11/2015  . Chronic recurrent major depressive disorder ([place]) 04/11/2015  .  Dysmetabolic syndrome 123456  . Neurogenic claudication 04/11/2015  . Perennial allergic rhinitis with seasonal variation 04/11/2015  . Abnormal presence of protein in urine 04/11/2015  . Seborrhea capitis 04/11/2015  . Moderate dysplasia of vulva 04/11/2015  . Vitamin D deficiency 04/11/2015  . Treadmill stress test negative for angina pectoris 03/05/2015  . Shortness of breath 05/20/2014  . Morbid obesity ([place]) 02/03/2014  . Asthma, chronic 12/31/2013  . H/O total knee replacement 12/31/2013  . Episcleritis 09/26/2013  . Vocal cord dysfunction 05/28/2013  . Anxiety, generalized 03/19/2013  . Back pain 12/18/2012  . Pulmonary nodules 11/26/2012  . Lung nodule, multiple 11/26/2012  . Arthritis, degenerative 11/01/2012  . H/O aspiration pneumonitis 10/22/2012  . Postinflammatory pulmonary fibrosis ([place]) 10/22/2012  . Mixed incontinence 05/04/2012  . Benign neoplasm of pituitary gland and craniopharyngeal duct ([place]) 12/29/2010  . Lupus (systemic lupus erythematosus) ([place]) 11/05/2010  . Chronic nausea 07/01/2008  . Benign hypertension 01/25/2005    Past Surgical History:  Procedure Laterality Date  . BRAIN SURGERY    . CHOLECYSTECTOMY    . VAGINAL HYSTERECTOMY      Family History  Problem Relation Age of Onset  . Breast cancer Mother   . Cancer Mother 68    Breast  . Cancer Father 77    Stomach  . Breast cancer Maternal Aunt     Social History   Social History  . Marital status: Divorced    Spouse name: N/A  . Number of children: N/A  .  Years of education: N/A   Occupational History  . Not on file.   Social History Main Topics  . Smoking status: Never Smoker  . Smokeless tobacco: Never Used  . Alcohol use No  . Drug use: No  . Sexual activity: Not Currently   Other Topics Concern  . Not on file   Social History Narrative  . No narrative on file     Current Outpatient Prescriptions:  .  sulfamethoxazole-trimethoprim (BACTRIM,SEPTRA) 400-80 MG  tablet, Take by mouth., Disp: , Rfl:  .  albuterol (PROAIR HFA) 108 (90 BASE) MCG/ACT inhaler, Inhale 2 puffs into the lungs every 6 (six) hours as needed for wheezing. DX code R06.02, Disp: 3 Inhaler, Rfl: 3 .  buPROPion (WELLBUTRIN XL) 150 MG 24 hr tablet, Take 1 tablet by mouth daily. Michiel Cowboy, Disp: , Rfl:  .  Cholecalciferol (VITAMIN D) 2000 UNITS CAPS, Take 1 capsule by mouth daily., Disp: , Rfl:  .  clonazePAM (KLONOPIN) 0.5 MG tablet, Take 0.5 tablets (0.25 mg total) by mouth daily., Disp: 15 tablet, Rfl: 0 .  cyclobenzaprine (FLEXERIL) 5 MG tablet, Take 1 tablet (5 mg total) by mouth every 8 (eight) hours as needed for muscle spasms. Do not drive for 8 hours, Disp: 21 tablet, Rfl: 0 .  diclofenac sodium (VOLTAREN) 1 % GEL, APP 2 GRAMS EXT AA QID, Disp: , Rfl: 6 .  docusate sodium (COLACE) 100 MG capsule, Take 2 capsules (200 mg total) by mouth 2 (two) times daily., Disp: 120 capsule, Rfl: 0 .  DULoxetine (CYMBALTA) 60 MG capsule, Take 120 mg by mouth at bedtime. , Disp: , Rfl:  .  Estradiol 10 MCG TABS vaginal tablet, Place 0.01 mg vaginally., Disp: , Rfl:  .  FLOVENT HFA 110 MCG/ACT inhaler, INHALE 2 PUFFS PO BID, Disp: , Rfl: 0 .  fluticasone (FLONASE) 50 MCG/ACT nasal spray, USE 2 SPRAYS IN EACH  NOSTRIL DAILY, Disp: 48 g, Rfl: 1 .  Glycerin, Laxative, (RA GLYCERIN ADULT) 80.7 % SUPP, Place rectally., Disp: , Rfl:  .  hydroxychloroquine (PLAQUENIL) 200 MG tablet, Take 200 mg by mouth 2 (two) times daily., Disp: , Rfl:  .  lamoTRIgine (LAMICTAL) 100 MG tablet, Take 1 tablet by mouth 3 (three) times daily., Disp: , Rfl:  .  losartan (COZAAR) 25 MG tablet, TAKE 1 TABLET(25 MG) BY MOUTH DAILY, Disp: 90 tablet, Rfl: 0 .  meclizine (ANTIVERT) 25 MG tablet, Take 1 tablet (25 mg total) by mouth 3 (three) times daily as needed., Disp: 30 tablet, Rfl: 0 .  Multiple Vitamin (MULTIVITAMIN) tablet, Take 1 tablet by mouth daily., Disp: , Rfl:  .  omeprazole (PRILOSEC) 40 MG capsule, TAKE 1 CAPSULE  BY MOUTH  DAILY, Disp: 90 capsule, Rfl: 1 .  ondansetron (ZOFRAN) 4 MG tablet, Take 1 tablet (4 mg total) by mouth every 8 (eight) hours as needed for nausea or vomiting., Disp: 180 tablet, Rfl: 0 .  polyethylene glycol powder (GLYCOLAX/MIRALAX) powder, 2 cap fulls in a full glass of water, three times a day, for 5 days. (Patient not taking: Reported on 08/06/2015), Disp: 255 g, Rfl: 0 .  [START ON 01/30/2017] predniSONE (DELTASONE) 5 MG tablet, Take 5 mg by mouth., Disp: , Rfl:  .  ranitidine (ZANTAC) 150 MG tablet, Take 1 tablet (150 mg total) by mouth 2 (two) times daily., Disp: 60 tablet, Rfl: 0 .  topiramate (TOPAMAX) 50 MG tablet, Take 1 tablet by mouth 3 (three) times daily. Dr. [place] James - Pain management,  Disp: , Rfl:  .  traZODone (DESYREL) 150 MG tablet, Take 150 mg by mouth at bedtime., Disp: , Rfl:  .  Vitamin D, Ergocalciferol, (DRISDOL) 50000 units CAPS capsule, TAKE 1 CAPSULE BY MOUTH EVERY 7 DAYS, Disp: 12 capsule, Rfl: 0  Allergies  Allergen Reactions  . Ace Inhibitors     Other reaction(s): OTHER Pt states she can not take ace inhibitors. Pt states she can not remember her reaction.   Marland Kitchen Penicillins      ROS  Ten systems reviewed and is negative except as mentioned in HPI   Objective  Vitals:   09/29/16 1354  BP: 118/74  Pulse: 88  Resp: 16  Temp: 98.1 F (36.7 C)  SpO2: 95%  Weight: 231 lb 7 oz (105 kg)  Height: 5\' 8"  (1.727 m)    Body mass index is 35.19 kg/m.  Physical Exam  Constitutional: Patient appears well-developed and well-nourished. Obese  No distress.  HEENT: head atraumatic, normocephalic, pupils equal and reactive to light, ears normal TM bilaterally, neck supple, throat within normal limits Cardiovascular: Normal rate, regular rhythm and normal heart sounds.  No murmur heard. No BLE edema. Pulmonary/Chest: Effort normal and breath sounds normal. No respiratory distress. Abdominal: Soft.  There is no tenderness. Psychiatric: Patient has  a normal mood and affect. behavior is normal. Judgment and thought content normal. Neurologist : no focal findings, no nystagmus  PHQ2/9: Depression screen [facility] 2/9 07/12/2016 04/29/2016 01/28/2016 12/30/2015 12/15/2015  Decreased Interest 0 0 0 0 0  Down, Depressed, Hopeless 0 1 0 0 0  PHQ - 2 Score 0 1 0 0 0     Fall Risk: Fall Risk  07/12/2016 04/29/2016 01/28/2016 12/30/2015 12/15/2015  Falls in the past year? No No No No No  Number falls in past yr: - - - - -  Injury with Fall? - - - - -    Assessment & Plan  1. Palpitation  Likely from the steroid, EKG was normal today, discussed cold compresses on face or try to bear down if symptoms returns -EKG   2. Bronchiolitis obliterans organizing pneumonia ([place])  On prednisone and antibiotics given by pulmonologist   3. Vertigo  Similar symptoms one year ago, she has chronic hearing loss right side, she has seen ENT and neurologist in the past and we will refer back to ENT. She was at [facility] because vertigo so intense that she fell, but not as intense now.  - meclizine (ANTIVERT) 25 MG tablet; Take 1 tablet (25 mg total) by mouth 3 (three) times daily as needed.  Dispense: 30 tablet; Refill: 0 - Ambulatory referral to ENT   4. Chronic nausea  - ondansetron (ZOFRAN) 4 MG tablet; Take 1 tablet (4 mg total) by mouth every 8 (eight) hours as needed for nausea or vomiting.  Dispense: 60 tablet; Refill: 0

## 2016-10-03 DIAGNOSIS — M797 Fibromyalgia: Secondary | ICD-10-CM | POA: Diagnosis not present

## 2016-10-03 DIAGNOSIS — M48061 Spinal stenosis, lumbar region without neurogenic claudication: Secondary | ICD-10-CM | POA: Diagnosis not present

## 2016-10-03 DIAGNOSIS — M542 Cervicalgia: Secondary | ICD-10-CM | POA: Diagnosis not present

## 2016-10-03 DIAGNOSIS — G894 Chronic pain syndrome: Secondary | ICD-10-CM | POA: Diagnosis not present

## 2016-10-03 DIAGNOSIS — G8929 Other chronic pain: Secondary | ICD-10-CM | POA: Diagnosis not present

## 2016-10-03 DIAGNOSIS — M545 Low back pain: Secondary | ICD-10-CM | POA: Diagnosis not present

## 2016-10-06 DIAGNOSIS — Z7952 Long term (current) use of systemic steroids: Secondary | ICD-10-CM | POA: Diagnosis not present

## 2016-10-06 DIAGNOSIS — R05 Cough: Secondary | ICD-10-CM | POA: Diagnosis not present

## 2016-10-06 DIAGNOSIS — K5909 Other constipation: Secondary | ICD-10-CM | POA: Diagnosis not present

## 2016-10-06 DIAGNOSIS — I129 Hypertensive chronic kidney disease with stage 1 through stage 4 chronic kidney disease, or unspecified chronic kidney disease: Secondary | ICD-10-CM | POA: Diagnosis not present

## 2016-10-06 DIAGNOSIS — H905 Unspecified sensorineural hearing loss: Secondary | ICD-10-CM | POA: Diagnosis not present

## 2016-10-06 DIAGNOSIS — Z96651 Presence of right artificial knee joint: Secondary | ICD-10-CM | POA: Diagnosis not present

## 2016-10-06 DIAGNOSIS — M545 Low back pain: Secondary | ICD-10-CM | POA: Diagnosis not present

## 2016-10-06 DIAGNOSIS — Z7409 Other reduced mobility: Secondary | ICD-10-CM | POA: Diagnosis not present

## 2016-10-06 DIAGNOSIS — Z9181 History of falling: Secondary | ICD-10-CM | POA: Diagnosis not present

## 2016-10-06 DIAGNOSIS — R531 Weakness: Secondary | ICD-10-CM | POA: Diagnosis not present

## 2016-10-06 DIAGNOSIS — J45909 Unspecified asthma, uncomplicated: Secondary | ICD-10-CM | POA: Diagnosis not present

## 2016-10-06 DIAGNOSIS — M797 Fibromyalgia: Secondary | ICD-10-CM | POA: Diagnosis not present

## 2016-10-06 DIAGNOSIS — M329 Systemic lupus erythematosus, unspecified: Secondary | ICD-10-CM | POA: Diagnosis not present

## 2016-10-06 DIAGNOSIS — Z79899 Other long term (current) drug therapy: Secondary | ICD-10-CM | POA: Diagnosis not present

## 2016-10-06 DIAGNOSIS — M25561 Pain in right knee: Secondary | ICD-10-CM | POA: Diagnosis not present

## 2016-10-06 DIAGNOSIS — N181 Chronic kidney disease, stage 1: Secondary | ICD-10-CM | POA: Diagnosis not present

## 2016-10-06 DIAGNOSIS — Z7951 Long term (current) use of inhaled steroids: Secondary | ICD-10-CM | POA: Diagnosis not present

## 2016-10-06 DIAGNOSIS — G8929 Other chronic pain: Secondary | ICD-10-CM | POA: Diagnosis not present

## 2016-10-06 DIAGNOSIS — Z880 Allergy status to penicillin: Secondary | ICD-10-CM | POA: Diagnosis not present

## 2016-10-06 DIAGNOSIS — M1990 Unspecified osteoarthritis, unspecified site: Secondary | ICD-10-CM | POA: Diagnosis not present

## 2016-10-06 DIAGNOSIS — R0602 Shortness of breath: Secondary | ICD-10-CM | POA: Diagnosis not present

## 2016-10-11 DIAGNOSIS — Z96651 Presence of right artificial knee joint: Secondary | ICD-10-CM | POA: Diagnosis not present

## 2016-10-11 DIAGNOSIS — Z7951 Long term (current) use of inhaled steroids: Secondary | ICD-10-CM | POA: Diagnosis not present

## 2016-10-11 DIAGNOSIS — M797 Fibromyalgia: Secondary | ICD-10-CM | POA: Diagnosis not present

## 2016-10-11 DIAGNOSIS — I129 Hypertensive chronic kidney disease with stage 1 through stage 4 chronic kidney disease, or unspecified chronic kidney disease: Secondary | ICD-10-CM | POA: Diagnosis not present

## 2016-10-11 DIAGNOSIS — M545 Low back pain: Secondary | ICD-10-CM | POA: Diagnosis not present

## 2016-10-11 DIAGNOSIS — M25561 Pain in right knee: Secondary | ICD-10-CM | POA: Diagnosis not present

## 2016-10-11 DIAGNOSIS — G8929 Other chronic pain: Secondary | ICD-10-CM | POA: Diagnosis not present

## 2016-10-11 DIAGNOSIS — N181 Chronic kidney disease, stage 1: Secondary | ICD-10-CM | POA: Diagnosis not present

## 2016-10-11 DIAGNOSIS — K5909 Other constipation: Secondary | ICD-10-CM | POA: Diagnosis not present

## 2016-10-11 DIAGNOSIS — R05 Cough: Secondary | ICD-10-CM | POA: Diagnosis not present

## 2016-10-11 DIAGNOSIS — J45909 Unspecified asthma, uncomplicated: Secondary | ICD-10-CM | POA: Diagnosis not present

## 2016-10-11 DIAGNOSIS — Z9181 History of falling: Secondary | ICD-10-CM | POA: Diagnosis not present

## 2016-10-11 DIAGNOSIS — R0602 Shortness of breath: Secondary | ICD-10-CM | POA: Diagnosis not present

## 2016-10-11 DIAGNOSIS — R531 Weakness: Secondary | ICD-10-CM | POA: Diagnosis not present

## 2016-10-11 DIAGNOSIS — M329 Systemic lupus erythematosus, unspecified: Secondary | ICD-10-CM | POA: Diagnosis not present

## 2016-10-11 DIAGNOSIS — M1990 Unspecified osteoarthritis, unspecified site: Secondary | ICD-10-CM | POA: Diagnosis not present

## 2016-10-11 DIAGNOSIS — Z79899 Other long term (current) drug therapy: Secondary | ICD-10-CM | POA: Diagnosis not present

## 2016-10-11 DIAGNOSIS — Z7952 Long term (current) use of systemic steroids: Secondary | ICD-10-CM | POA: Diagnosis not present

## 2016-10-11 DIAGNOSIS — H905 Unspecified sensorineural hearing loss: Secondary | ICD-10-CM | POA: Diagnosis not present

## 2016-10-11 DIAGNOSIS — Z880 Allergy status to penicillin: Secondary | ICD-10-CM | POA: Diagnosis not present

## 2016-10-11 DIAGNOSIS — Z7409 Other reduced mobility: Secondary | ICD-10-CM | POA: Diagnosis not present

## 2016-10-20 DIAGNOSIS — J8489 Other specified interstitial pulmonary diseases: Secondary | ICD-10-CM | POA: Diagnosis not present

## 2016-10-23 ENCOUNTER — Other Ambulatory Visit: Payer: Self-pay | Admitting: Family Medicine

## 2016-10-26 DIAGNOSIS — N181 Chronic kidney disease, stage 1: Secondary | ICD-10-CM | POA: Diagnosis not present

## 2016-10-26 DIAGNOSIS — R0602 Shortness of breath: Secondary | ICD-10-CM | POA: Diagnosis not present

## 2016-10-26 DIAGNOSIS — Z9889 Other specified postprocedural states: Secondary | ICD-10-CM | POA: Diagnosis not present

## 2016-10-26 DIAGNOSIS — R531 Weakness: Secondary | ICD-10-CM | POA: Diagnosis not present

## 2016-10-26 DIAGNOSIS — J45909 Unspecified asthma, uncomplicated: Secondary | ICD-10-CM | POA: Diagnosis not present

## 2016-10-26 DIAGNOSIS — M3219 Other organ or system involvement in systemic lupus erythematosus: Secondary | ICD-10-CM | POA: Diagnosis not present

## 2016-10-26 DIAGNOSIS — M545 Low back pain: Secondary | ICD-10-CM | POA: Diagnosis not present

## 2016-10-26 DIAGNOSIS — M329 Systemic lupus erythematosus, unspecified: Secondary | ICD-10-CM | POA: Diagnosis not present

## 2016-10-26 DIAGNOSIS — R05 Cough: Secondary | ICD-10-CM | POA: Diagnosis not present

## 2016-10-26 DIAGNOSIS — M797 Fibromyalgia: Secondary | ICD-10-CM | POA: Diagnosis not present

## 2016-10-26 DIAGNOSIS — Z7951 Long term (current) use of inhaled steroids: Secondary | ICD-10-CM | POA: Diagnosis not present

## 2016-10-26 DIAGNOSIS — Z880 Allergy status to penicillin: Secondary | ICD-10-CM | POA: Diagnosis not present

## 2016-10-26 DIAGNOSIS — Z792 Long term (current) use of antibiotics: Secondary | ICD-10-CM | POA: Diagnosis not present

## 2016-10-26 DIAGNOSIS — G8929 Other chronic pain: Secondary | ICD-10-CM | POA: Diagnosis not present

## 2016-10-26 DIAGNOSIS — M25561 Pain in right knee: Secondary | ICD-10-CM | POA: Diagnosis not present

## 2016-10-26 DIAGNOSIS — Z7409 Other reduced mobility: Secondary | ICD-10-CM | POA: Diagnosis not present

## 2016-10-26 DIAGNOSIS — I129 Hypertensive chronic kidney disease with stage 1 through stage 4 chronic kidney disease, or unspecified chronic kidney disease: Secondary | ICD-10-CM | POA: Diagnosis not present

## 2016-10-26 DIAGNOSIS — Z7952 Long term (current) use of systemic steroids: Secondary | ICD-10-CM | POA: Diagnosis not present

## 2016-10-26 DIAGNOSIS — Z79899 Other long term (current) drug therapy: Secondary | ICD-10-CM | POA: Diagnosis not present

## 2016-10-26 DIAGNOSIS — M1990 Unspecified osteoarthritis, unspecified site: Secondary | ICD-10-CM | POA: Diagnosis not present

## 2016-10-26 DIAGNOSIS — R778 Other specified abnormalities of plasma proteins: Secondary | ICD-10-CM | POA: Diagnosis not present

## 2016-10-26 DIAGNOSIS — Z9181 History of falling: Secondary | ICD-10-CM | POA: Diagnosis not present

## 2016-10-26 DIAGNOSIS — H905 Unspecified sensorineural hearing loss: Secondary | ICD-10-CM | POA: Diagnosis not present

## 2016-10-26 DIAGNOSIS — K5909 Other constipation: Secondary | ICD-10-CM | POA: Diagnosis not present

## 2016-10-26 DIAGNOSIS — Z888 Allergy status to other drugs, medicaments and biological substances status: Secondary | ICD-10-CM | POA: Diagnosis not present

## 2016-10-26 DIAGNOSIS — Z96651 Presence of right artificial knee joint: Secondary | ICD-10-CM | POA: Diagnosis not present

## 2016-10-26 DIAGNOSIS — M3500 Sicca syndrome, unspecified: Secondary | ICD-10-CM | POA: Diagnosis not present

## 2016-10-26 DIAGNOSIS — Z96659 Presence of unspecified artificial knee joint: Secondary | ICD-10-CM | POA: Diagnosis not present

## 2016-10-26 DIAGNOSIS — K219 Gastro-esophageal reflux disease without esophagitis: Secondary | ICD-10-CM | POA: Diagnosis not present

## 2016-10-30 ENCOUNTER — Other Ambulatory Visit: Payer: Self-pay | Admitting: Family Medicine

## 2016-10-31 ENCOUNTER — Encounter: Payer: Self-pay | Admitting: Family Medicine

## 2016-10-31 ENCOUNTER — Ambulatory Visit (INDEPENDENT_AMBULATORY_CARE_PROVIDER_SITE_OTHER): Payer: Medicare Other | Admitting: Family Medicine

## 2016-10-31 VITALS — BP 122/86 | HR 95 | Temp 98.4°F | Resp 16 | Ht 68.0 in | Wt 235.3 lb

## 2016-10-31 DIAGNOSIS — R002 Palpitations: Secondary | ICD-10-CM

## 2016-10-31 DIAGNOSIS — I10 Essential (primary) hypertension: Secondary | ICD-10-CM

## 2016-10-31 DIAGNOSIS — J8281 Chronic eosinophilic pneumonia: Secondary | ICD-10-CM

## 2016-10-31 DIAGNOSIS — F339 Major depressive disorder, recurrent, unspecified: Secondary | ICD-10-CM

## 2016-10-31 DIAGNOSIS — E785 Hyperlipidemia, unspecified: Secondary | ICD-10-CM | POA: Diagnosis not present

## 2016-10-31 DIAGNOSIS — J82 Pulmonary eosinophilia, not elsewhere classified: Secondary | ICD-10-CM

## 2016-10-31 DIAGNOSIS — M3210 Systemic lupus erythematosus, organ or system involvement unspecified: Secondary | ICD-10-CM

## 2016-10-31 DIAGNOSIS — K5909 Other constipation: Secondary | ICD-10-CM

## 2016-10-31 DIAGNOSIS — M320 Drug-induced systemic lupus erythematosus: Secondary | ICD-10-CM

## 2016-10-31 MED ORDER — LOSARTAN POTASSIUM-HCTZ 50-12.5 MG PO TABS: 1.0000 | ORAL_TABLET | Freq: Every day | ORAL | 1 refills | Status: DC

## 2016-10-31 NOTE — Progress Notes (Signed)
Name: Nicole Ballard   MRN: [id number]    DOB: 05-Dec-1955   Date:10/31/2016       Progress Note  Subjective  Chief Complaint  Chief Complaint  Patient presents with  . Palpitations    1 month follow up, Dr. Hedda Slade last week and states she was informed palpitations are common with Prednisone, since lowering medication to 30 mg daily they have decreased significally. She states they are not completely gone but much better than since being off the steriod treatment.     HPI  Palpitation: secondary to prednisone, started on Prednisone January 2018 for treatment of eosinophilic pneumonitis. She states since she knows it is secondary to prednisone she no longer feels scared. No chest pain or diaphoresis associated with this.   Lupus: sees Rheumatologist at [facility], she was diagnosed in 2006, she had episcleritis, arthralgia and positive ANA and ds DNA. She has CKI stage I  HTN: well controlled, no chest pain, has palpitation secondary to prednisone, and will be on medication until May. BP is at goal  Hyperlipidemia: on lifestyle modification only  Chronic constipation: she is on Miralax and is also having PT, having bowel movements about once a week. She has ordered a Physiological scientist.     Patient Active Problem List   Diagnosis Date Noted  . Vertigo 09/29/2016  . Bronchiolitis obliterans organizing pneumonia ([place]) 09/29/2016  . Neck pain, musculoskeletal 12/15/2015  . Anxiety about health 09/29/2015  . Abnormal CAT scan 04/11/2015  . Auditory impairment 04/11/2015  . Insomnia, persistent 04/11/2015  . Chronic kidney disease (CKD), stage I 04/11/2015  . Chronic nonmalignant pain 04/11/2015  . Chronic constipation 04/11/2015  . Dyslipidemia 04/11/2015  . Fibromyalgia 04/11/2015  . Gastro-esophageal reflux disease without esophagitis 04/11/2015  . Dysphonia 04/11/2015  . Absence of bladder continence 04/11/2015  . LBP (low back pain) 04/11/2015  . Eczema intertrigo 04/11/2015  .  Keratosis pilaris 04/11/2015  . Chronic recurrent major depressive disorder ([place]) 04/11/2015  . Dysmetabolic syndrome 123456  . Neurogenic claudication 04/11/2015  . Perennial allergic rhinitis with seasonal variation 04/11/2015  . Abnormal presence of protein in urine 04/11/2015  . Seborrhea capitis 04/11/2015  . Moderate dysplasia of vulva 04/11/2015  . Vitamin D deficiency 04/11/2015  . Treadmill stress test negative for angina pectoris 03/05/2015  . Shortness of breath 05/20/2014  . Morbid obesity ([employer]) 02/03/2014  . Asthma, chronic 12/31/2013  . H/O total knee replacement 12/31/2013  . Episcleritis 09/26/2013  . Vocal cord dysfunction 05/28/2013  . Anxiety, generalized 03/19/2013  . Back pain 12/18/2012  . Pulmonary nodules 11/26/2012  . Lung nodule, multiple 11/26/2012  . Arthritis, degenerative 11/01/2012  . H/O aspiration pneumonitis 10/22/2012  . Postinflammatory pulmonary fibrosis ([place]) 10/22/2012  . Mixed incontinence 05/04/2012  . Benign neoplasm of pituitary gland and craniopharyngeal duct ([place]) 12/29/2010  . Lupus (systemic lupus erythematosus) ([place]) 11/05/2010  . Chronic nausea 07/01/2008  . Benign hypertension 01/25/2005    Past Surgical History:  Procedure Laterality Date  . BRAIN SURGERY    . CHOLECYSTECTOMY    . VAGINAL HYSTERECTOMY      Family History  Problem Relation Age of Onset  . Breast cancer Mother   . Cancer Mother 88    Breast  . Cancer Father 36    Stomach  . Breast cancer Maternal Aunt     Social History   Social History  . Marital status: Divorced    Spouse name: N/A  . Number of children: N/A  .  Years of education: N/A   Occupational History  . Not on file.   Social History Main Topics  . Smoking status: Never Smoker  . Smokeless tobacco: Never Used  . Alcohol use No  . Drug use: No  . Sexual activity: Not Currently   Other Topics Concern  . Not on file   Social History Narrative  . No narrative on file      Current Outpatient Prescriptions:  .  albuterol (PROAIR HFA) 108 (90 BASE) MCG/ACT inhaler, Inhale 2 puffs into the lungs every 6 (six) hours as needed for wheezing. DX code R06.02, Disp: 3 Inhaler, Rfl: 3 .  buPROPion (WELLBUTRIN XL) 150 MG 24 hr tablet, Take 1 tablet by mouth daily. Michiel Cowboy, Disp: , Rfl:  .  Cholecalciferol (VITAMIN D) 2000 UNITS CAPS, Take 1 capsule by mouth daily., Disp: , Rfl:  .  clonazePAM (KLONOPIN) 0.5 MG tablet, Take 0.5 tablets (0.25 mg total) by mouth daily., Disp: 15 tablet, Rfl: 0 .  cyclobenzaprine (FLEXERIL) 5 MG tablet, Take 1 tablet (5 mg total) by mouth every 8 (eight) hours as needed for muscle spasms. Do not drive for 8 hours, Disp: 21 tablet, Rfl: 0 .  diclofenac sodium (VOLTAREN) 1 % GEL, APP 2 GRAMS EXT AA QID, Disp: , Rfl: 6 .  docusate sodium (COLACE) 100 MG capsule, Take 2 capsules (200 mg total) by mouth 2 (two) times daily., Disp: 120 capsule, Rfl: 0 .  DULoxetine (CYMBALTA) 60 MG capsule, Take 120 mg by mouth at bedtime. , Disp: , Rfl:  .  Estradiol 10 MCG TABS vaginal tablet, Place 0.01 mg vaginally., Disp: , Rfl:  .  FLOVENT HFA 110 MCG/ACT inhaler, INHALE 2 PUFFS PO BID, Disp: , Rfl: 0 .  fluticasone (FLONASE) 50 MCG/ACT nasal spray, USE 2 SPRAYS IN EACH  NOSTRIL DAILY, Disp: 48 g, Rfl: 1 .  gabapentin (NEURONTIN) 300 MG capsule, Take 1 capsule by mouth 3 (three) times daily., Disp: , Rfl:  .  Glycerin, Laxative, (RA GLYCERIN ADULT) 80.7 % SUPP, Place rectally., Disp: , Rfl:  .  hydroxychloroquine (PLAQUENIL) 200 MG tablet, Take 200 mg by mouth 2 (two) times daily., Disp: , Rfl:  .  lamoTRIgine (LAMICTAL) 100 MG tablet, Take 1 tablet by mouth 3 (three) times daily., Disp: , Rfl:  .  losartan (COZAAR) 25 MG tablet, TAKE 1 TABLET(25 MG) BY MOUTH DAILY, Disp: 90 tablet, Rfl: 0 .  losartan-hydrochlorothiazide (HYZAAR) 50-12.5 MG tablet, Take 1 tablet by mouth daily., Disp: , Rfl:  .  meclizine (ANTIVERT) 25 MG tablet, Take 1 tablet (25 mg  total) by mouth 3 (three) times daily as needed., Disp: 30 tablet, Rfl: 0 .  Multiple Vitamin (MULTIVITAMIN) tablet, Take 1 tablet by mouth daily., Disp: , Rfl:  .  omeprazole (PRILOSEC) 40 MG capsule, TAKE 1 CAPSULE BY MOUTH  DAILY, Disp: 90 capsule, Rfl: 1 .  ondansetron (ZOFRAN) 4 MG tablet, Take 1 tablet (4 mg total) by mouth every 8 (eight) hours as needed for nausea or vomiting., Disp: 60 tablet, Rfl: 0 .  polyethylene glycol powder (GLYCOLAX/MIRALAX) powder, 2 cap fulls in a full glass of water, three times a day, for 5 days., Disp: 255 g, Rfl: 0 .  predniSONE (DELTASONE) 20 MG tablet, , Disp: , Rfl:  .  [START ON 01/30/2017] predniSONE (DELTASONE) 5 MG tablet, Take 5 mg by mouth., Disp: , Rfl:  .  ranitidine (ZANTAC) 150 MG tablet, Take 1 tablet (150 mg total) by mouth 2 (two)  times daily., Disp: 60 tablet, Rfl: 0 .  topiramate (TOPAMAX) 50 MG tablet, Take 1 tablet by mouth 3 (three) times daily. Dr. [place] James - Pain management, Disp: , Rfl:  .  traZODone (DESYREL) 150 MG tablet, Take 150 mg by mouth at bedtime., Disp: , Rfl:  .  triamcinolone ointment (KENALOG) 0.1 %, , Disp: , Rfl:  .  Vitamin D, Ergocalciferol, (DRISDOL) 50000 units CAPS capsule, TAKE 1 CAPSULE BY MOUTH EVERY 7 DAYS, Disp: 12 capsule, Rfl: 0  Allergies  Allergen Reactions  . Ace Inhibitors     Other reaction(s): OTHER Pt states she can not take ace inhibitors. Pt states she can not remember her reaction.   Marland Kitchen Penicillins      ROS  Ten systems reviewed and is negative except as mentioned in HPI   Objective  Vitals:   10/31/16 1018  BP: 122/86  Pulse: 95  Resp: 16  Temp: 98.4 F (36.9 C)  TempSrc: Oral  SpO2: 96%  Weight: 235 lb 4.8 oz (106.7 kg)  Height: 5\' 8"  (1.727 m)    Body mass index is 35.78 kg/m.  Physical Exam  Constitutional: Patient appears well-developed and well-nourished. Obese  No distress.  HEENT: head atraumatic, normocephalic, pupils equal and reactive to light,  neck  supple, throat within normal limits Cardiovascular: Normal rate, regular rhythm and normal heart sounds.  No murmur heard. No BLE edema. Pulmonary/Chest: Effort normal and breath sounds normal. No respiratory distress. Abdominal: Soft.  There is no tenderness. Psychiatric: Patient has a normal mood and affect. behavior is normal. Judgment and thought content normal.  PHQ2/9: Depression screen [facility] 2/9 10/31/2016 07/12/2016 04/29/2016 01/28/2016 12/30/2015  Decreased Interest 0 0 0 0 0  Down, Depressed, Hopeless 0 0 1 0 0  PHQ - 2 Score 0 0 1 0 0     Fall Risk: Fall Risk  10/31/2016 07/12/2016 04/29/2016 01/28/2016 12/30/2015  Falls in the past year? Yes No No No No  Number falls in past yr: 1 - - - -  Injury with Fall? No - - - -     Functional Status Survey: Is the patient deaf or have difficulty hearing?: No Does the patient have difficulty seeing, even when wearing glasses/contacts?: No Does the patient have difficulty concentrating, remembering, or making decisions?: No Does the patient have difficulty walking or climbing stairs?: No Does the patient have difficulty dressing or bathing?: No Does the patient have difficulty doing errands alone such as visiting a doctor's office or shopping?: No    Assessment & Plan  1. Palpitation  Improving since dose of Prednisone is going down   2. Benign hypertension  - losartan-hydrochlorothiazide (HYZAAR) 50-12.5 MG tablet; Take 1 tablet by mouth daily.  Dispense: 90 tablet; Refill: 1  3. Dyslipidemia  Continue life style modification  4. Chronic recurrent major depressive disorder (HCC)  Doing much better, cheerful today   5. Chronic constipation  Continue PT  6. Eosinophilic pneumonia (HCC)  Continue prednisone   7. Drug-induced systemic lupus erythematosus with other organ involvement ([place])  Continue follow up with [facility]

## 2016-11-10 DIAGNOSIS — H04123 Dry eye syndrome of bilateral lacrimal glands: Secondary | ICD-10-CM | POA: Diagnosis not present

## 2016-11-10 DIAGNOSIS — H269 Unspecified cataract: Secondary | ICD-10-CM | POA: Diagnosis not present

## 2016-11-10 DIAGNOSIS — H43393 Other vitreous opacities, bilateral: Secondary | ICD-10-CM | POA: Diagnosis not present

## 2016-11-10 DIAGNOSIS — M329 Systemic lupus erythematosus, unspecified: Secondary | ICD-10-CM | POA: Diagnosis not present

## 2016-11-10 DIAGNOSIS — Z79899 Other long term (current) drug therapy: Secondary | ICD-10-CM | POA: Diagnosis not present

## 2016-11-18 DIAGNOSIS — J8489 Other specified interstitial pulmonary diseases: Secondary | ICD-10-CM | POA: Diagnosis not present

## 2016-11-25 DIAGNOSIS — D353 Benign neoplasm of craniopharyngeal duct: Secondary | ICD-10-CM | POA: Diagnosis not present

## 2016-11-25 DIAGNOSIS — N644 Mastodynia: Secondary | ICD-10-CM | POA: Diagnosis not present

## 2016-11-25 DIAGNOSIS — Z8249 Family history of ischemic heart disease and other diseases of the circulatory system: Secondary | ICD-10-CM | POA: Diagnosis not present

## 2016-11-25 DIAGNOSIS — I129 Hypertensive chronic kidney disease with stage 1 through stage 4 chronic kidney disease, or unspecified chronic kidney disease: Secondary | ICD-10-CM | POA: Diagnosis not present

## 2016-11-25 DIAGNOSIS — N181 Chronic kidney disease, stage 1: Secondary | ICD-10-CM | POA: Diagnosis not present

## 2016-11-25 DIAGNOSIS — M797 Fibromyalgia: Secondary | ICD-10-CM | POA: Diagnosis not present

## 2016-11-25 DIAGNOSIS — Z1231 Encounter for screening mammogram for malignant neoplasm of breast: Secondary | ICD-10-CM | POA: Diagnosis not present

## 2016-11-25 DIAGNOSIS — R51 Headache: Secondary | ICD-10-CM | POA: Diagnosis not present

## 2016-11-25 DIAGNOSIS — M329 Systemic lupus erythematosus, unspecified: Secondary | ICD-10-CM | POA: Diagnosis not present

## 2016-11-25 DIAGNOSIS — J45909 Unspecified asthma, uncomplicated: Secondary | ICD-10-CM | POA: Diagnosis not present

## 2016-11-25 DIAGNOSIS — N179 Acute kidney failure, unspecified: Secondary | ICD-10-CM | POA: Diagnosis not present

## 2016-11-25 DIAGNOSIS — Z803 Family history of malignant neoplasm of breast: Secondary | ICD-10-CM | POA: Diagnosis not present

## 2016-11-25 DIAGNOSIS — D352 Benign neoplasm of pituitary gland: Secondary | ICD-10-CM | POA: Diagnosis not present

## 2016-11-25 DIAGNOSIS — K219 Gastro-esophageal reflux disease without esophagitis: Secondary | ICD-10-CM | POA: Diagnosis not present

## 2016-11-29 DIAGNOSIS — H9313 Tinnitus, bilateral: Secondary | ICD-10-CM | POA: Diagnosis not present

## 2016-11-29 DIAGNOSIS — R42 Dizziness and giddiness: Secondary | ICD-10-CM | POA: Diagnosis not present

## 2016-11-29 DIAGNOSIS — H9041 Sensorineural hearing loss, unilateral, right ear, with unrestricted hearing on the contralateral side: Secondary | ICD-10-CM | POA: Diagnosis not present

## 2016-12-22 ENCOUNTER — Telehealth: Payer: Self-pay | Admitting: Family Medicine

## 2016-12-22 NOTE — Telephone Encounter (Signed)
Pt was on losartan-hctz 100-25 mg previously dosage was changed during the 12/30/15 office visit to 50-12.5 but there is not any documentation as to why so I'm unsure of the dose she is supposed to be on, please advise. Pt would also like to know if it is ok for her to take claritin

## 2016-12-22 NOTE — Telephone Encounter (Signed)
Patient stated that she has 2 different mg for her blood pressure medications and she would like to know which one she is suppose to take.  Patient would also like to know if she is able to take Claritin for her allergies.  Please advise.

## 2016-12-22 NOTE — Telephone Encounter (Signed)
Looks like another provides was given her 25 of Losartan also.  Call pharmacy and fill the dose that she last filled and cancel other doses

## 2016-12-23 NOTE — Telephone Encounter (Signed)
Per Dr. Ancil Boozer I informed patient to take the 50-12.5 mg of losartan -hctz and to let her know that it was ok for her to take Claritin for allergies.

## 2016-12-28 ENCOUNTER — Encounter: Payer: Self-pay | Admitting: Family Medicine

## 2016-12-28 ENCOUNTER — Ambulatory Visit (INDEPENDENT_AMBULATORY_CARE_PROVIDER_SITE_OTHER): Payer: Medicare Other | Admitting: Family Medicine

## 2016-12-28 VITALS — BP 122/76 | HR 99 | Temp 98.5°F | Resp 16 | Ht 68.0 in | Wt 251.5 lb

## 2016-12-28 DIAGNOSIS — H918X9 Other specified hearing loss, unspecified ear: Secondary | ICD-10-CM | POA: Diagnosis not present

## 2016-12-28 DIAGNOSIS — M26621 Arthralgia of right temporomandibular joint: Secondary | ICD-10-CM | POA: Diagnosis not present

## 2016-12-28 DIAGNOSIS — I10 Essential (primary) hypertension: Secondary | ICD-10-CM | POA: Diagnosis not present

## 2016-12-28 DIAGNOSIS — S161XXA Strain of muscle, fascia and tendon at neck level, initial encounter: Secondary | ICD-10-CM

## 2016-12-28 MED ORDER — CYCLOBENZAPRINE HCL 5 MG PO TABS: 5.0000 mg | ORAL_TABLET | Freq: Three times a day (TID) | ORAL | 1 refills | Status: DC | PRN

## 2016-12-28 MED ORDER — LOSARTAN POTASSIUM-HCTZ 50-12.5 MG PO TABS: 1.0000 | ORAL_TABLET | Freq: Every day | ORAL | 1 refills | Status: DC

## 2016-12-28 NOTE — Patient Instructions (Addendum)
Temporomandibular Joint Syndrome Temporomandibular joint (TMJ) syndrome is a condition that affects the joints between your jaw and your skull. The TMJs are located near your ears and allow your jaw to open and close. These joints and the nearby muscles are involved in all movements of the jaw. People with TMJ syndrome have pain in the area of these joints and muscles. Chewing, biting, or other movements of the jaw can be difficult or painful. TMJ syndrome can be caused by various things. In many cases, the condition is mild and goes away within a few weeks. For some people, the condition can become a long-term problem. What are the causes? Possible causes of TMJ syndrome include:  Grinding your teeth or clenching your jaw. Some people do this when they are under stress.  Arthritis.  Injury to the jaw.  Head or neck injury.  Teeth or dentures that are not aligned well. In some cases, the cause of TMJ syndrome may not be known. What are the signs or symptoms? The most common symptom is an aching pain on the side of the head in the area of the TMJ. Other symptoms may include:  Pain when moving your jaw, such as when chewing or biting.  Being unable to open your jaw all the way.  Making a clicking sound when you open your mouth.  Headache.  Earache.  Neck or shoulder pain. How is this diagnosed? Diagnosis can usually be made based on your symptoms, your medical history, and a physical exam. Your health care provider may check the range of motion of your jaw. Imaging tests, such as X-rays or an MRI, are sometimes done. You may need to see your dentist to determine if your teeth and jaw are lined up correctly. How is this treated? TMJ syndrome often goes away on its own. If treatment is needed, the options may include:  Eating soft foods and applying ice or heat.  Medicines to relieve pain or inflammation.  Medicines to relax the muscles.  A splint, bite plate, or mouthpiece to  prevent teeth grinding or jaw clenching.  Relaxation techniques or counseling to help reduce stress.  Transcutaneous electrical nerve stimulation (TENS). This helps to relieve pain by applying an electrical current through the skin.  Acupuncture. This is sometimes helpful to relieve pain.  Jaw surgery. This is rarely needed. Follow these instructions at home:  Take medicines only as directed by your health care provider.  Eat a soft diet if you are having trouble chewing.  Apply ice to the painful area.  Put ice in a plastic bag.  Place a towel between your skin and the bag.  Leave the ice on for 20 minutes, 2-3 times a day.  Apply a warm compress to the painful area as directed.  Massage your jaw area and perform any jaw stretching exercises as recommended by your health care provider.  If you were given a mouthpiece or bite plate, wear it as directed.  Avoid foods that require a lot of chewing. Do not chew gum.  Keep all follow-up visits as directed by your health care provider. This is important. Contact a health care provider if:  You are having trouble eating.  You have new or worsening symptoms. Get help right away if:  Your jaw locks open or closed. This information is not intended to replace advice given to you by your health care provider. Make sure you discuss any questions you have with your health care provider. Document Released: 05/17/2001 Document  Revised: 04/21/2016 Document Reviewed: 03/27/2014 Elsevier Interactive Patient Education  2017 [employer].

## 2016-12-28 NOTE — Progress Notes (Signed)
Name: Nicole Ballard   MRN: [id number]    DOB: 1956/02/05   Date:12/28/2016       Progress Note  Subjective  Chief Complaint  Chief Complaint  Patient presents with  . Ear Pain    Onset couple of weeks and radiates to her right side of her neck. Takes Tylenol but not receiving any relief    HPI  Ear pain: she states she was chewing on the left side and developed acute pain and popping sensation on right TMJ area, she states that since than she has noticed pain goes down on the right side of neck. Pain is constant but with periods of exacerbation, usually triggered by chewing and also sore on right side of neck when she touches it. No rashes or fever.   HTN: taking Hyzaar but having problems with pharmacy, only dispensing losartan. She denies chest pain or palpitation    Patient Active Problem List   Diagnosis Date Noted  . Vertigo 09/29/2016  . Bronchiolitis obliterans organizing pneumonia ([place]) 09/29/2016  . Neck pain, musculoskeletal 12/15/2015  . Anxiety about health 09/29/2015  . Abnormal CAT scan 04/11/2015  . Auditory impairment 04/11/2015  . Insomnia, persistent 04/11/2015  . Chronic kidney disease (CKD), stage I 04/11/2015  . Chronic nonmalignant pain 04/11/2015  . Chronic constipation 04/11/2015  . Dyslipidemia 04/11/2015  . Fibromyalgia 04/11/2015  . Gastro-esophageal reflux disease without esophagitis 04/11/2015  . Dysphonia 04/11/2015  . Absence of bladder continence 04/11/2015  . LBP (low back pain) 04/11/2015  . Eczema intertrigo 04/11/2015  . Keratosis pilaris 04/11/2015  . Chronic recurrent major depressive disorder ([place]) 04/11/2015  . Dysmetabolic syndrome 78/29/5621  . Neurogenic claudication 04/11/2015  . Perennial allergic rhinitis with seasonal variation 04/11/2015  . Abnormal presence of protein in urine 04/11/2015  . Seborrhea capitis 04/11/2015  . Moderate dysplasia of vulva 04/11/2015  . Vitamin D deficiency 04/11/2015  . Treadmill stress  test negative for angina pectoris 03/05/2015  . Shortness of breath 05/20/2014  . Morbid obesity ([place]) 02/03/2014  . Asthma, chronic 12/31/2013  . H/O total knee replacement 12/31/2013  . Episcleritis 09/26/2013  . Vocal cord dysfunction 05/28/2013  . Anxiety, generalized 03/19/2013  . Back pain 12/18/2012  . Pulmonary nodules 11/26/2012  . Lung nodule, multiple 11/26/2012  . Arthritis, degenerative 11/01/2012  . H/O aspiration pneumonitis 10/22/2012  . Postinflammatory pulmonary fibrosis ([place]) 10/22/2012  . Mixed incontinence 05/04/2012  . Benign neoplasm of pituitary gland and craniopharyngeal duct ([place]) 12/29/2010  . Lupus (systemic lupus erythematosus) ([place]) 11/05/2010  . Chronic nausea 07/01/2008  . Benign hypertension 01/25/2005    Past Surgical History:  Procedure Laterality Date  . BRAIN SURGERY    . CHOLECYSTECTOMY    . VAGINAL HYSTERECTOMY      Family History  Problem Relation Age of Onset  . Breast cancer Mother   . Cancer Mother 69    Breast  . Cancer Father 34    Stomach  . Breast cancer Maternal Aunt     Social History   Social History  . Marital status: Divorced    Spouse name: N/A  . Number of children: N/A  . Years of education: N/A   Occupational History  . Not on file.   Social History Main Topics  . Smoking status: Never Smoker  . Smokeless tobacco: Never Used  . Alcohol use No  . Drug use: No  . Sexual activity: Not Currently   Other Topics Concern  . Not on file  Social History Narrative  . No narrative on file     Current Outpatient Prescriptions:  .  albuterol (PROAIR HFA) 108 (90 BASE) MCG/ACT inhaler, Inhale 2 puffs into the lungs every 6 (six) hours as needed for wheezing. DX code R06.02, Disp: 3 Inhaler, Rfl: 3 .  buPROPion (WELLBUTRIN XL) 300 MG 24 hr tablet, Take 1 tablet by mouth daily., Disp: , Rfl:  .  Cholecalciferol (VITAMIN D) 2000 UNITS CAPS, Take 1 capsule by mouth daily., Disp: , Rfl:  .  clonazePAM (KLONOPIN)  0.5 MG tablet, Take 0.5 tablets (0.25 mg total) by mouth daily., Disp: 15 tablet, Rfl: 0 .  cyclobenzaprine (FLEXERIL) 5 MG tablet, Take 1 tablet (5 mg total) by mouth every 8 (eight) hours as needed for muscle spasms. Do not drive for 8 hours, Disp: 21 tablet, Rfl: 0 .  diclofenac sodium (VOLTAREN) 1 % GEL, APP 2 GRAMS EXT AA QID, Disp: , Rfl: 6 .  docusate sodium (COLACE) 100 MG capsule, Take 2 capsules (200 mg total) by mouth 2 (two) times daily., Disp: 120 capsule, Rfl: 0 .  DULoxetine (CYMBALTA) 60 MG capsule, Take 120 mg by mouth at bedtime. , Disp: , Rfl:  .  Estradiol 10 MCG TABS vaginal tablet, Place 0.01 mg vaginally., Disp: , Rfl:  .  FLOVENT HFA 110 MCG/ACT inhaler, INHALE 2 PUFFS PO BID, Disp: , Rfl: 0 .  fluticasone (FLONASE) 50 MCG/ACT nasal spray, USE 2 SPRAYS IN EACH  NOSTRIL DAILY, Disp: 48 g, Rfl: 1 .  gabapentin (NEURONTIN) 300 MG capsule, Take 1 capsule by mouth 3 (three) times daily., Disp: , Rfl:  .  Glycerin, Laxative, (RA GLYCERIN ADULT) 80.7 % SUPP, Place rectally., Disp: , Rfl:  .  hydroxychloroquine (PLAQUENIL) 200 MG tablet, Take 200 mg by mouth 2 (two) times daily., Disp: , Rfl:  .  lamoTRIgine (LAMICTAL) 100 MG tablet, Take 1 tablet by mouth 3 (three) times daily., Disp: , Rfl:  .  losartan-hydrochlorothiazide (HYZAAR) 50-12.5 MG tablet, Take 1 tablet by mouth daily., Disp: 90 tablet, Rfl: 1 .  meclizine (ANTIVERT) 25 MG tablet, Take 1 tablet (25 mg total) by mouth 3 (three) times daily as needed., Disp: 30 tablet, Rfl: 0 .  Multiple Vitamin (MULTIVITAMIN) tablet, Take 1 tablet by mouth daily., Disp: , Rfl:  .  omeprazole (PRILOSEC) 40 MG capsule, TAKE 1 CAPSULE BY MOUTH  DAILY, Disp: 90 capsule, Rfl: 1 .  ondansetron (ZOFRAN) 4 MG tablet, Take 1 tablet (4 mg total) by mouth every 8 (eight) hours as needed for nausea or vomiting., Disp: 60 tablet, Rfl: 0 .  polyethylene glycol powder (GLYCOLAX/MIRALAX) powder, 2 cap fulls in a full glass of water, three times a day,  for 5 days., Disp: 255 g, Rfl: 0 .  predniSONE (DELTASONE) 20 MG tablet, Take 30 mg by mouth daily., Disp: , Rfl:  .  ranitidine (ZANTAC) 150 MG tablet, Take 1 tablet (150 mg total) by mouth 2 (two) times daily., Disp: 60 tablet, Rfl: 0 .  topiramate (TOPAMAX) 50 MG tablet, Take 1 tablet by mouth 3 (three) times daily. Dr. [place] James - Pain management, Disp: , Rfl:  .  traZODone (DESYREL) 100 MG tablet, Take 1 tablet by mouth at bedtime., Disp: , Rfl:  .  triamcinolone ointment (KENALOG) 0.1 %, , Disp: , Rfl:  .  Vitamin D, Ergocalciferol, (DRISDOL) 50000 units CAPS capsule, TAKE 1 CAPSULE BY MOUTH EVERY 7 DAYS, Disp: 12 capsule, Rfl: 0  Allergies  Allergen Reactions  .  Ace Inhibitors     Other reaction(s): OTHER Pt states she can not take ace inhibitors. Pt states she can not remember her reaction.   Marland Kitchen Penicillins      ROS  Ten systems reviewed and is negative except as mentioned in HPI   Objective  Vitals:   12/28/16 1122  BP: 122/76  Pulse: 99  Resp: 16  Temp: 98.5 F (36.9 C)  TempSrc: Oral  SpO2: 95%  Weight: 251 lb 8 oz (114.1 kg)  Height: 5\' 8"  (1.727 m)    Body mass index is 38.24 kg/m.  Physical Exam  Constitutional: Patient appears well-developed and well-nourished. Obese No distress.  HEENT: head atraumatic, normocephalic, pupils equal and reactive to light, ears normal TM bilaterally, neck supple, but she has pain and spasms on right sternocleidomastoid, also pain with abduction of the jaw on right side, throat within normal limits Cardiovascular: Normal rate, regular rhythm and normal heart sounds.  No murmur heard. No BLE edema. Pulmonary/Chest: Effort normal and breath sounds normal. No respiratory distress. Abdominal: Soft.  There is no tenderness. Psychiatric: Patient has a depressed affects, seen by psychiatrist yesterday   PHQ2/9: Depression screen [facility] 2/9 10/31/2016 07/12/2016 04/29/2016 01/28/2016 12/30/2015  Decreased Interest 0 0 0 0 0  Down,  Depressed, Hopeless 0 0 1 0 0  PHQ - 2 Score 0 0 1 0 0     Fall Risk: Fall Risk  10/31/2016 07/12/2016 04/29/2016 01/28/2016 12/30/2015  Falls in the past year? Yes No No No No  Number falls in past yr: 1 - - - -  Injury with Fall? No - - - -      Assessment & Plan  1. Arthralgia of right temporomandibular joint  - cyclobenzaprine (FLEXERIL) 5 MG tablet; Take 1 tablet (5 mg total) by mouth every 8 (eight) hours as needed for muscle spasms. Do not drive for 8 hours  Dispense: 90 tablet; Refill: 1 Also advised topical medication   2. Benign hypertension  She states [facility] was sending her losartan without HCTZ, she has been taking Hyzaar 50/12.5 and I will resent rx to her pharmacy.  - losartan-hydrochlorothiazide (HYZAAR) 50-12.5 MG tablet; Take 1 tablet by mouth daily.  Dispense: 90 tablet; Refill: 1  3. Strain of sternocleidomastoid muscle, initial encounter  - cyclobenzaprine (FLEXERIL) 5 MG tablet; Take 1 tablet (5 mg total) by mouth every 8 (eight) hours as needed for muscle spasms. Do not drive for 8 hours  Dispense: 90 tablet; Refill: 1

## 2017-01-19 ENCOUNTER — Ambulatory Visit: Payer: Medicare Other | Admitting: Family Medicine

## 2017-01-19 ENCOUNTER — Ambulatory Visit (INDEPENDENT_AMBULATORY_CARE_PROVIDER_SITE_OTHER): Payer: Medicare Other | Admitting: Family Medicine

## 2017-01-19 ENCOUNTER — Encounter: Payer: Self-pay | Admitting: Family Medicine

## 2017-01-19 VITALS — BP 124/70 | HR 105 | Temp 99.3°F | Resp 18 | Ht 68.0 in | Wt 259.9 lb

## 2017-01-19 DIAGNOSIS — H1013 Acute atopic conjunctivitis, bilateral: Secondary | ICD-10-CM | POA: Diagnosis not present

## 2017-01-19 DIAGNOSIS — J0100 Acute maxillary sinusitis, unspecified: Secondary | ICD-10-CM | POA: Diagnosis not present

## 2017-01-19 DIAGNOSIS — J309 Allergic rhinitis, unspecified: Secondary | ICD-10-CM

## 2017-01-19 MED ORDER — AZELASTINE HCL 0.05 % OP SOLN: 1.0000 [drp] | Freq: Two times a day (BID) | OPHTHALMIC | 1 refills | Status: DC

## 2017-01-19 MED ORDER — DOXYCYCLINE HYCLATE 50 MG PO CAPS: 100.0000 mg | ORAL_CAPSULE | Freq: Two times a day (BID) | ORAL | 0 refills | Status: AC

## 2017-01-19 NOTE — Progress Notes (Addendum)
Name: Nicole Ballard   MRN: [id number]    DOB: 27-Oct-1955   Date:01/19/2017       Progress Note  Subjective  Chief Complaint  Chief Complaint  Patient presents with  . Ear Pain    pain feels like pain is bothering her neck   . Eye Problem    red, painful, feels like sand in eyes  . Cough    for 2 weeks    HPI  Pt presents with 2 week history of sneezing, bilateral eye redness with itching and irritation, headache, and non-productive cough - she started taking Claritin and uses Flonase daily for these symptoms with some relief. She notes eye itching/irritation began abut 5 days ago - she called her ophthalmologist 3 days ago and he told her to use Refresh - this made the eyes hurt worse, she describes intermittent bilateral peripheral fields are diminished/blurred for a few seconds at a time.  The eye irritation is described as very itchy and sometimes painful. No pain with eye movement.  Patient Active Problem List   Diagnosis Date Noted  . Vertigo 09/29/2016  . Bronchiolitis obliterans organizing pneumonia ([place]) 09/29/2016  . Neck pain, musculoskeletal 12/15/2015  . Anxiety about health 09/29/2015  . Abnormal CAT scan 04/11/2015  . Auditory impairment 04/11/2015  . Insomnia, persistent 04/11/2015  . Chronic kidney disease (CKD), stage I 04/11/2015  . Chronic nonmalignant pain 04/11/2015  . Chronic constipation 04/11/2015  . Dyslipidemia 04/11/2015  . Fibromyalgia 04/11/2015  . Gastro-esophageal reflux disease without esophagitis 04/11/2015  . Dysphonia 04/11/2015  . Absence of bladder continence 04/11/2015  . LBP (low back pain) 04/11/2015  . Eczema intertrigo 04/11/2015  . Keratosis pilaris 04/11/2015  . Chronic recurrent major depressive disorder ([place]) 04/11/2015  . Dysmetabolic syndrome 33/54/5625  . Neurogenic claudication 04/11/2015  . Perennial allergic rhinitis with seasonal variation 04/11/2015  . Abnormal presence of protein in urine 04/11/2015  . Seborrhea  capitis 04/11/2015  . Moderate dysplasia of vulva 04/11/2015  . Vitamin D deficiency 04/11/2015  . Treadmill stress test negative for angina pectoris 03/05/2015  . Shortness of breath 05/20/2014  . Morbid obesity ([place]) 02/03/2014  . Asthma, chronic 12/31/2013  . H/O total knee replacement 12/31/2013  . Episcleritis 09/26/2013  . Vocal cord dysfunction 05/28/2013  . Anxiety, generalized 03/19/2013  . Back pain 12/18/2012  . Pulmonary nodules 11/26/2012  . Lung nodule, multiple 11/26/2012  . Arthritis, degenerative 11/01/2012  . H/O aspiration pneumonitis 10/22/2012  . Postinflammatory pulmonary fibrosis ([place]) 10/22/2012  . Mixed incontinence 05/04/2012  . Benign neoplasm of pituitary gland and craniopharyngeal duct ([place]) 12/29/2010  . Lupus (systemic lupus erythematosus) ([place]) 11/05/2010  . Chronic nausea 07/01/2008  . Benign hypertension 01/25/2005    Social History  Substance Use Topics  . Smoking status: Never Smoker  . Smokeless tobacco: Never Used  . Alcohol use No     Current Outpatient Prescriptions:  .  albuterol (PROAIR HFA) 108 (90 BASE) MCG/ACT inhaler, Inhale 2 puffs into the lungs every 6 (six) hours as needed for wheezing. DX code R06.02, Disp: 3 Inhaler, Rfl: 3 .  buPROPion (WELLBUTRIN XL) 300 MG 24 hr tablet, Take 1 tablet by mouth daily., Disp: , Rfl:  .  Cholecalciferol (VITAMIN D) 2000 UNITS CAPS, Take 1 capsule by mouth daily., Disp: , Rfl:  .  clonazePAM (KLONOPIN) 0.5 MG tablet, Take 0.5 tablets (0.25 mg total) by mouth daily., Disp: 15 tablet, Rfl: 0 .  cyclobenzaprine (FLEXERIL) 5 MG tablet, Take 1 tablet (  5 mg total) by mouth every 8 (eight) hours as needed for muscle spasms. Do not drive for 8 hours, Disp: 90 tablet, Rfl: 1 .  diclofenac sodium (VOLTAREN) 1 % GEL, APP 2 GRAMS EXT AA QID, Disp: , Rfl: 6 .  docusate sodium (COLACE) 100 MG capsule, Take 2 capsules (200 mg total) by mouth 2 (two) times daily., Disp: 120 capsule, Rfl: 0 .  DULoxetine  (CYMBALTA) 60 MG capsule, Take 120 mg by mouth at bedtime. , Disp: , Rfl:  .  FLOVENT HFA 110 MCG/ACT inhaler, INHALE 2 PUFFS PO BID, Disp: , Rfl: 0 .  fluticasone (FLONASE) 50 MCG/ACT nasal spray, USE 2 SPRAYS IN EACH  NOSTRIL DAILY, Disp: 48 g, Rfl: 1 .  gabapentin (NEURONTIN) 300 MG capsule, Take 1 capsule by mouth 3 (three) times daily., Disp: , Rfl:  .  Glycerin, Laxative, (RA GLYCERIN ADULT) 80.7 % SUPP, Place rectally., Disp: , Rfl:  .  hydroxychloroquine (PLAQUENIL) 200 MG tablet, Take 200 mg by mouth 2 (two) times daily., Disp: , Rfl:  .  lamoTRIgine (LAMICTAL) 100 MG tablet, Take 1 tablet by mouth 3 (three) times daily., Disp: , Rfl:  .  losartan-hydrochlorothiazide (HYZAAR) 50-12.5 MG tablet, Take 1 tablet by mouth daily., Disp: 90 tablet, Rfl: 1 .  meclizine (ANTIVERT) 25 MG tablet, Take 1 tablet (25 mg total) by mouth 3 (three) times daily as needed., Disp: 30 tablet, Rfl: 0 .  Multiple Vitamin (MULTIVITAMIN) tablet, Take 1 tablet by mouth daily., Disp: , Rfl:  .  omeprazole (PRILOSEC) 40 MG capsule, TAKE 1 CAPSULE BY MOUTH  DAILY, Disp: 90 capsule, Rfl: 1 .  ondansetron (ZOFRAN) 4 MG tablet, Take 1 tablet (4 mg total) by mouth every 8 (eight) hours as needed for nausea or vomiting., Disp: 60 tablet, Rfl: 0 .  polyethylene glycol powder (GLYCOLAX/MIRALAX) powder, 2 cap fulls in a full glass of water, three times a day, for 5 days., Disp: 255 g, Rfl: 0 .  predniSONE (DELTASONE) 20 MG tablet, Take 30 mg by mouth daily., Disp: , Rfl:  .  ranitidine (ZANTAC) 150 MG tablet, Take 1 tablet (150 mg total) by mouth 2 (two) times daily., Disp: 60 tablet, Rfl: 0 .  topiramate (TOPAMAX) 50 MG tablet, Take 1 tablet by mouth 3 (three) times daily. Dr. [place] James - Pain management, Disp: , Rfl:  .  traZODone (DESYREL) 100 MG tablet, Take 1 tablet by mouth at bedtime., Disp: , Rfl:  .  triamcinolone ointment (KENALOG) 0.1 %, , Disp: , Rfl:  .  Vitamin D, Ergocalciferol, (DRISDOL) 50000 units CAPS  capsule, TAKE 1 CAPSULE BY MOUTH EVERY 7 DAYS, Disp: 12 capsule, Rfl: 0  Allergies  Allergen Reactions  . Ace Inhibitors     Other reaction(s): OTHER Pt states she can not take ace inhibitors. Pt states she can not remember her reaction.   Marland Kitchen Penicillins     ROS  Constitutional: Negative for fever/chills or weight change.  Respiratory: Negative for cough and shortness of breath.   Cardiovascular: Negative for chest pain or palpitations.  Gastrointestinal: Negative for abdominal pain, no bowel changes. Mild nausea. Musculoskeletal: Negative for gait problem or joint swelling.  Skin: Negative for rash.  Neurological: Negative for dizziness. Positive for headache.  No other specific complaints in a complete review of systems (except as listed in HPI above).  Objective  Vitals:   01/19/17 1324  BP: 124/70  Pulse: (!) 105  Resp: 18  Temp: 99.3 F (37.4 C)  TempSrc: Oral  SpO2: 93%  Weight: 259 lb 14.4 oz (117.9 kg)  Height: 5\' 8"  (1.727 m)    Body mass index is 39.52 kg/m.  Nursing Note and Vital Signs reviewed.  Physical Exam  Constitutional: Patient appears well-developed and well-nourished. Obese No distress.  HEENT: head atraumatic, normocephalic, pupils equal and reactive to light, EOM's intact, sclera and conjunctiva moderately inflamed - no foreign body visualized, TM's without erythema or bulging, no frontal sinus pain on palpation, positive maxillary sinus tenderness on palpation neck supple without lymphadenopathy, oropharynx pink and moist without exudate Cardiovascular: Normal rate, regular rhythm, S1/S2 present.  No murmur or rub heard. No BLE edema. Pulmonary/Chest: Effort normal and breath sounds diminished at the bases otherwise clear. No respiratory distress or retractions. Abdominal: Soft and non-tender, bowel sounds present x4 quadrants. Psychiatric: Patient has a normal mood and affect. behavior is normal. Judgment and thought content normal.  No  results found for this or any previous visit (from the past 2160 hour(s)).  Assessment & Plan  1. Allergic conjunctivitis and rhinitis, bilateral - azelastine (OPTIVAR) 0.05 % ophthalmic solution; Place 1 drop into both eyes 2 (two) times daily.  Dispense: 6 mL; Refill: 1  2. Acute non-recurrent maxillary sinusitis - doxycycline (VIBRAMYCIN) 50 MG capsule; Take 2 capsules (100 mg total) by mouth 2 (two) times daily.  Dispense: 40 capsule; Refill: 0  -Continue flonase daily, take claritin twice a day until symptoms decline.  -Red flags and when to present for emergency care or RTC including fever >101.37F, chest pain, shortness of breath, new/worsening/un-resolving symptoms, visual field changes reviewed with patient at time of visit. Follow up and care instructions discussed and provided in AVS. -Reviewed Health Maintenance: UTD   I have reviewed this encounter including the documentation in this note and/or discussed this patient with the Johney Maine, FNP, NP-C. I am certifying that I agree with the content of this note as supervising physician.  Steele Sizer, MD [facility] 01/20/2017, 7:43 AM

## 2017-01-24 ENCOUNTER — Other Ambulatory Visit: Payer: Self-pay | Admitting: Family Medicine

## 2017-01-24 DIAGNOSIS — K219 Gastro-esophageal reflux disease without esophagitis: Secondary | ICD-10-CM

## 2017-01-24 NOTE — Telephone Encounter (Signed)
Patient requesting refill of Omeprazole to [facility]. 

## 2017-01-27 NOTE — Telephone Encounter (Signed)
Please confirm with patient why she is on chronic PPI therapy plus chronic H2 blocker therapy Does she have Barretts? Any history of ulcer? Any dark stools? I'll recommend a referral to GI if needing prolonged PPI at her age Thank you

## 2017-02-01 ENCOUNTER — Encounter: Payer: Self-pay | Admitting: Family Medicine

## 2017-02-01 ENCOUNTER — Ambulatory Visit (INDEPENDENT_AMBULATORY_CARE_PROVIDER_SITE_OTHER): Payer: Medicare Other | Admitting: Family Medicine

## 2017-02-01 VITALS — BP 118/72 | HR 110 | Temp 99.0°F | Resp 18 | Ht 68.0 in | Wt 251.7 lb

## 2017-02-01 DIAGNOSIS — R0602 Shortness of breath: Secondary | ICD-10-CM | POA: Diagnosis not present

## 2017-02-01 DIAGNOSIS — R0981 Nasal congestion: Secondary | ICD-10-CM | POA: Diagnosis not present

## 2017-02-01 DIAGNOSIS — M329 Systemic lupus erythematosus, unspecified: Secondary | ICD-10-CM | POA: Diagnosis not present

## 2017-02-01 DIAGNOSIS — N181 Chronic kidney disease, stage 1: Secondary | ICD-10-CM | POA: Diagnosis not present

## 2017-02-01 DIAGNOSIS — R0989 Other specified symptoms and signs involving the circulatory and respiratory systems: Secondary | ICD-10-CM | POA: Diagnosis not present

## 2017-02-01 DIAGNOSIS — Z79899 Other long term (current) drug therapy: Secondary | ICD-10-CM | POA: Diagnosis not present

## 2017-02-01 DIAGNOSIS — H905 Unspecified sensorineural hearing loss: Secondary | ICD-10-CM | POA: Diagnosis not present

## 2017-02-01 DIAGNOSIS — Z888 Allergy status to other drugs, medicaments and biological substances status: Secondary | ICD-10-CM | POA: Diagnosis not present

## 2017-02-01 DIAGNOSIS — R059 Cough, unspecified: Secondary | ICD-10-CM

## 2017-02-01 DIAGNOSIS — I129 Hypertensive chronic kidney disease with stage 1 through stage 4 chronic kidney disease, or unspecified chronic kidney disease: Secondary | ICD-10-CM | POA: Diagnosis not present

## 2017-02-01 DIAGNOSIS — J45909 Unspecified asthma, uncomplicated: Secondary | ICD-10-CM | POA: Diagnosis not present

## 2017-02-01 DIAGNOSIS — R05 Cough: Secondary | ICD-10-CM

## 2017-02-01 DIAGNOSIS — B9789 Other viral agents as the cause of diseases classified elsewhere: Secondary | ICD-10-CM | POA: Diagnosis not present

## 2017-02-01 DIAGNOSIS — Z880 Allergy status to penicillin: Secondary | ICD-10-CM | POA: Diagnosis not present

## 2017-02-01 DIAGNOSIS — K219 Gastro-esophageal reflux disease without esophagitis: Secondary | ICD-10-CM | POA: Diagnosis not present

## 2017-02-01 DIAGNOSIS — J811 Chronic pulmonary edema: Secondary | ICD-10-CM | POA: Diagnosis not present

## 2017-02-01 DIAGNOSIS — J069 Acute upper respiratory infection, unspecified: Secondary | ICD-10-CM | POA: Diagnosis not present

## 2017-02-01 MED ORDER — BENZONATATE 200 MG PO CAPS: 200.0000 mg | ORAL_CAPSULE | Freq: Two times a day (BID) | ORAL | 0 refills | Status: DC | PRN

## 2017-02-01 NOTE — Patient Instructions (Addendum)
Please go directly to the ER via EMS. Please schedule a follow up with [place] 1-3 days after you are discharged. Thank you!

## 2017-02-01 NOTE — Addendum Note (Signed)
Addended by: Hubbard Hartshorn on: 02/01/2017 08:33 AM   Modules accepted: Orders

## 2017-02-01 NOTE — Progress Notes (Addendum)
Name: Nicole Ballard   MRN: [id number]    DOB: 03-Nov-1955   Date:02/01/2017       Progress Note  Subjective  Chief Complaint  Chief Complaint  Patient presents with  . Cough    cough makes chest and back hurt, seen 2 weeks ago  . Shortness of Breath    HPI  Pt presents with c/o ongoing cough and shortness of breath. She still has been taking the doxycycline as prescribed, using flonase as prescribed, and using eye drops as prescribed. Eye irritation has improved greatly, but she continues to have cough and some shortness of breath with activity. She  Has been taking her daily flovent, but has not been using her home albuterol inhaler.  She did not know she could use the albuterol inhaler more than once a day.  Endorses ongoing headaches, sinus congestion, has right ear pain, sore throat, and has some chest pain when she coughs really hard. No Abdominal pain or NVD.  Pt has lupus and recently tapered off of prednisone after a 5 month regimen.  Patient Active Problem List   Diagnosis Date Noted  . Vertigo 09/29/2016  . Bronchiolitis obliterans organizing pneumonia ([place]) 09/29/2016  . Neck pain, musculoskeletal 12/15/2015  . Anxiety about health 09/29/2015  . Abnormal CAT scan 04/11/2015  . Auditory impairment 04/11/2015  . Insomnia, persistent 04/11/2015  . Chronic kidney disease (CKD), stage I 04/11/2015  . Chronic nonmalignant pain 04/11/2015  . Chronic constipation 04/11/2015  . Dyslipidemia 04/11/2015  . Fibromyalgia 04/11/2015  . Gastro-esophageal reflux disease without esophagitis 04/11/2015  . Dysphonia 04/11/2015  . Absence of bladder continence 04/11/2015  . LBP (low back pain) 04/11/2015  . Eczema intertrigo 04/11/2015  . Keratosis pilaris 04/11/2015  . Chronic recurrent major depressive disorder ([place]) 04/11/2015  . Dysmetabolic syndrome 75/64/3329  . Neurogenic claudication 04/11/2015  . Perennial allergic rhinitis with seasonal variation 04/11/2015  . Abnormal  presence of protein in urine 04/11/2015  . Seborrhea capitis 04/11/2015  . Moderate dysplasia of vulva 04/11/2015  . Vitamin D deficiency 04/11/2015  . Treadmill stress test negative for angina pectoris 03/05/2015  . Shortness of breath 05/20/2014  . Morbid obesity ([place]) 02/03/2014  . Asthma, chronic 12/31/2013  . H/O total knee replacement 12/31/2013  . Episcleritis 09/26/2013  . Vocal cord dysfunction 05/28/2013  . Anxiety, generalized 03/19/2013  . Back pain 12/18/2012  . Pulmonary nodules 11/26/2012  . Lung nodule, multiple 11/26/2012  . Arthritis, degenerative 11/01/2012  . H/O aspiration pneumonitis 10/22/2012  . Postinflammatory pulmonary fibrosis ([place]) 10/22/2012  . Mixed incontinence 05/04/2012  . Benign neoplasm of pituitary gland and craniopharyngeal duct ([place]) 12/29/2010  . Lupus (systemic lupus erythematosus) ([place]) 11/05/2010  . Chronic nausea 07/01/2008  . Benign hypertension 01/25/2005    Social History  Substance Use Topics  . Smoking status: Never Smoker  . Smokeless tobacco: Never Used  . Alcohol use No     Current Outpatient Prescriptions:  .  albuterol (PROAIR HFA) 108 (90 BASE) MCG/ACT inhaler, Inhale 2 puffs into the lungs every 6 (six) hours as needed for wheezing. DX code R06.02, Disp: 3 Inhaler, Rfl: 3 .  azelastine (OPTIVAR) 0.05 % ophthalmic solution, Place 1 drop into both eyes 2 (two) times daily., Disp: 6 mL, Rfl: 1 .  buPROPion (WELLBUTRIN XL) 300 MG 24 hr tablet, Take 1 tablet by mouth daily., Disp: , Rfl:  .  Cholecalciferol (VITAMIN D) 2000 UNITS CAPS, Take 1 capsule by mouth daily., Disp: , Rfl:  .  clonazePAM (KLONOPIN) 0.5 MG tablet, Take 0.5 tablets (0.25 mg total) by mouth daily., Disp: 15 tablet, Rfl: 0 .  cyclobenzaprine (FLEXERIL) 5 MG tablet, Take 1 tablet (5 mg total) by mouth every 8 (eight) hours as needed for muscle spasms. Do not drive for 8 hours, Disp: 90 tablet, Rfl: 1 .  diclofenac sodium (VOLTAREN) 1 % GEL, APP 2 GRAMS EXT  AA QID, Disp: , Rfl: 6 .  docusate sodium (COLACE) 100 MG capsule, Take 2 capsules (200 mg total) by mouth 2 (two) times daily., Disp: 120 capsule, Rfl: 0 .  DULoxetine (CYMBALTA) 60 MG capsule, Take 120 mg by mouth at bedtime. , Disp: , Rfl:  .  FLOVENT HFA 110 MCG/ACT inhaler, INHALE 2 PUFFS PO BID, Disp: , Rfl: 0 .  fluticasone (FLONASE) 50 MCG/ACT nasal spray, USE 2 SPRAYS IN EACH  NOSTRIL DAILY, Disp: 48 g, Rfl: 1 .  gabapentin (NEURONTIN) 300 MG capsule, Take 1 capsule by mouth 3 (three) times daily., Disp: , Rfl:  .  Glycerin, Laxative, (RA GLYCERIN ADULT) 80.7 % SUPP, Place rectally., Disp: , Rfl:  .  hydroxychloroquine (PLAQUENIL) 200 MG tablet, Take 200 mg by mouth 2 (two) times daily., Disp: , Rfl:  .  lamoTRIgine (LAMICTAL) 100 MG tablet, Take 1 tablet by mouth 3 (three) times daily., Disp: , Rfl:  .  losartan-hydrochlorothiazide (HYZAAR) 50-12.5 MG tablet, Take 1 tablet by mouth daily., Disp: 90 tablet, Rfl: 1 .  meclizine (ANTIVERT) 25 MG tablet, Take 1 tablet (25 mg total) by mouth 3 (three) times daily as needed., Disp: 30 tablet, Rfl: 0 .  Multiple Vitamin (MULTIVITAMIN) tablet, Take 1 tablet by mouth daily., Disp: , Rfl:  .  omeprazole (PRILOSEC) 40 MG capsule, TAKE 1 CAPSULE BY MOUTH  DAILY, Disp: 90 capsule, Rfl: 1 .  ondansetron (ZOFRAN) 4 MG tablet, Take 1 tablet (4 mg total) by mouth every 8 (eight) hours as needed for nausea or vomiting., Disp: 60 tablet, Rfl: 0 .  ranitidine (ZANTAC) 150 MG tablet, Take 1 tablet (150 mg total) by mouth 2 (two) times daily., Disp: 60 tablet, Rfl: 0 .  topiramate (TOPAMAX) 50 MG tablet, Take 1 tablet by mouth 3 (three) times daily. Dr. [place] James - Pain management, Disp: , Rfl:  .  traZODone (DESYREL) 100 MG tablet, Take 1 tablet by mouth at bedtime., Disp: , Rfl:  .  triamcinolone ointment (KENALOG) 0.1 %, , Disp: , Rfl:  .  polyethylene glycol powder (GLYCOLAX/MIRALAX) powder, 2 cap fulls in a full glass of water, three times a day, for  5 days. (Patient not taking: Reported on 02/01/2017), Disp: 255 g, Rfl: 0 .  predniSONE (DELTASONE) 20 MG tablet, Take 30 mg by mouth daily., Disp: , Rfl:  .  Vitamin D, Ergocalciferol, (DRISDOL) 50000 units CAPS capsule, TAKE 1 CAPSULE BY MOUTH EVERY 7 DAYS (Patient not taking: Reported on 02/01/2017), Disp: 12 capsule, Rfl: 0  Allergies  Allergen Reactions  . Ace Inhibitors     Other reaction(s): OTHER Pt states she can not take ace inhibitors. Pt states she can not remember her reaction.   Marland Kitchen Penicillins     ROS  Ten systems reviewed and is negative except as mentioned in HPI  Objective  Vitals:   02/01/17 0756  BP: 118/72  Pulse: (!) 110  Resp: 18  Temp: 99 F (37.2 C)  TempSrc: Oral  SpO2: 94%  Weight: 251 lb 11.2 oz (114.2 kg)  Height: 5\' 8"  (1.727 m)  Body mass index is 38.27 kg/m.  Nursing Note and Vital Signs reviewed.  Physical Exam  Constitutional: Patient appears well-developed and well-nourished. Obese  No distress.  HEENT: head atraumatic, normocephalic, pupils equal and reactive to light, EOM's intact, TM's without erythema or bulging, no maxillary or frontal sinus pain on palpation, neck supple without lymphadenopathy, oropharynx pink and moist without exudate Cardiovascular: Normal rate, regular rhythm, S1/S2 present.  No murmur or rub heard. No BLE edema. Pulmonary/Chest: Effort normal and breath sounds clear, but slightly diminished in LLL. No respiratory distress or retractions. Coughing throughout exam, hoarse voice noted. SpO2 on Ambulation is 85%. Abdominal: Soft and non-tender, bowel sounds present x4 quadrants. Psychiatric: Patient has a normal mood and affect. behavior is normal. Judgment and thought content normal.  No results found for this or any previous visit (from the past 2160 hour(s)).   Assessment & Plan  1. Shortness of breath Advised patient that because SpO2 is 85% on ambulation, patient likely needs inpatient care. She expresses  strong preference to go to [facility] because most of her medical care is with [facility]. She is agreeable to go by EMS transport.    -Upon [facility] arrival, paramedic refuses transport to [facility] and insists pt be transported to [facility]. Discussed options with patient, and she called daughter who transported her to [facility]. SpO2 sitting is 95% RA, VSS. Discussed risks of going POV and patient verbalizes understanding.  Report called to [place].  2. Cough  - benzonatate (TESSALON) 200 MG capsule; Take 1 capsule (200 mg total) by mouth 2 (two) times daily as needed for cough.  Dispense: 30 capsule; Refill: 0 - Advised she may fill these after her hospital stay if needed.  - Follow up and care instructions discussed and provided in AVS.  I have reviewed this encounter including the documentation in this note and/or discussed this patient with the Johney Maine, FNP, NP-C. I am certifying that I agree with the content of this note as supervising physician.  Steele Sizer, MD [facility] 02/12/2017, 4:26 PM

## 2017-02-03 ENCOUNTER — Other Ambulatory Visit: Payer: Self-pay | Admitting: Family Medicine

## 2017-02-03 DIAGNOSIS — I10 Essential (primary) hypertension: Secondary | ICD-10-CM

## 2017-02-03 DIAGNOSIS — N181 Chronic kidney disease, stage 1: Secondary | ICD-10-CM

## 2017-02-06 DIAGNOSIS — R0602 Shortness of breath: Secondary | ICD-10-CM | POA: Diagnosis not present

## 2017-02-06 DIAGNOSIS — I129 Hypertensive chronic kidney disease with stage 1 through stage 4 chronic kidney disease, or unspecified chronic kidney disease: Secondary | ICD-10-CM | POA: Diagnosis not present

## 2017-02-06 DIAGNOSIS — R000 Tachycardia, unspecified: Secondary | ICD-10-CM | POA: Diagnosis not present

## 2017-02-06 DIAGNOSIS — R0989 Other specified symptoms and signs involving the circulatory and respiratory systems: Secondary | ICD-10-CM | POA: Diagnosis not present

## 2017-02-06 DIAGNOSIS — H905 Unspecified sensorineural hearing loss: Secondary | ICD-10-CM | POA: Diagnosis not present

## 2017-02-06 DIAGNOSIS — R918 Other nonspecific abnormal finding of lung field: Secondary | ICD-10-CM | POA: Diagnosis not present

## 2017-02-06 DIAGNOSIS — J45909 Unspecified asthma, uncomplicated: Secondary | ICD-10-CM | POA: Diagnosis not present

## 2017-02-06 DIAGNOSIS — R05 Cough: Secondary | ICD-10-CM | POA: Diagnosis not present

## 2017-02-06 DIAGNOSIS — M797 Fibromyalgia: Secondary | ICD-10-CM | POA: Diagnosis not present

## 2017-02-06 DIAGNOSIS — Z880 Allergy status to penicillin: Secondary | ICD-10-CM | POA: Diagnosis not present

## 2017-02-06 DIAGNOSIS — G8929 Other chronic pain: Secondary | ICD-10-CM | POA: Diagnosis not present

## 2017-02-06 DIAGNOSIS — Z7951 Long term (current) use of inhaled steroids: Secondary | ICD-10-CM | POA: Diagnosis not present

## 2017-02-06 DIAGNOSIS — M1990 Unspecified osteoarthritis, unspecified site: Secondary | ICD-10-CM | POA: Diagnosis not present

## 2017-02-06 DIAGNOSIS — K219 Gastro-esophageal reflux disease without esophagitis: Secondary | ICD-10-CM | POA: Diagnosis not present

## 2017-02-06 DIAGNOSIS — N181 Chronic kidney disease, stage 1: Secondary | ICD-10-CM | POA: Diagnosis not present

## 2017-02-06 DIAGNOSIS — Z79899 Other long term (current) drug therapy: Secondary | ICD-10-CM | POA: Diagnosis not present

## 2017-02-06 DIAGNOSIS — M329 Systemic lupus erythematosus, unspecified: Secondary | ICD-10-CM | POA: Diagnosis not present

## 2017-02-07 ENCOUNTER — Encounter: Payer: Self-pay | Admitting: Family Medicine

## 2017-02-07 ENCOUNTER — Ambulatory Visit (INDEPENDENT_AMBULATORY_CARE_PROVIDER_SITE_OTHER): Payer: Medicare Other | Admitting: Family Medicine

## 2017-02-07 VITALS — BP 124/86 | HR 100 | Temp 98.7°F | Resp 18 | Ht 68.0 in | Wt 250.4 lb

## 2017-02-07 DIAGNOSIS — M79645 Pain in left finger(s): Secondary | ICD-10-CM

## 2017-02-07 DIAGNOSIS — J189 Pneumonia, unspecified organism: Secondary | ICD-10-CM

## 2017-02-07 NOTE — Progress Notes (Addendum)
Name: Nicole Ballard   MRN: [id number]    DOB: 12/18/1955   Date:02/07/2017       Progress Note  Subjective  Chief Complaint  Chief Complaint  Patient presents with  . Follow-up    ER at [facility]  . Hand Pain    left hand swollen, painful    HPI  PT presents for ER follow up. Was seen twice, most recent visit was yesterday and she was diagnosed with Pneumonia and placed on Levaquin. She has been using albuterol treatments as needed when she feels short of breath. She has been taking Flovent 2 puffs once daily - instructed patient that she needs to be doing this twice daily.   She feels really "drained, like I don't have any energy".  She has an appointment with her pulmonologist in 2 days (Thursday). She has been drinking plenty of water, but has had a decreased appetite. No NVD, no chest pain, no abdominal pain, no confusion, no near-syncopal or syncopal episodes.  Chest CT by [facility] Found:  "Previously seen peripheral pulmonary parenchymal opacities, improved from previous study but there are new peripheral/pleural-based pulmonary parenchymal opacities, predominantly in the upper lobes."  LEFT Hand Pain: Pain to left posterior hand, worse at base of the 5th metatarsal x3-4 days. No recent injury. Did not have IV placed in this hand during ER visits. She is unsure if this is a Lupus flare or not. Has not taking any medications to help the pain.  Patient Active Problem List   Diagnosis Date Noted  . Vertigo 09/29/2016  . Bronchiolitis obliterans organizing pneumonia ([place]) 09/29/2016  . Neck pain, musculoskeletal 12/15/2015  . Anxiety about health 09/29/2015  . Abnormal CAT scan 04/11/2015  . Auditory impairment 04/11/2015  . Insomnia, persistent 04/11/2015  . Chronic kidney disease (CKD), stage I 04/11/2015  . Chronic nonmalignant pain 04/11/2015  . Chronic constipation 04/11/2015  . Dyslipidemia 04/11/2015  . Fibromyalgia 04/11/2015  . Gastro-esophageal reflux disease without  esophagitis 04/11/2015  . Dysphonia 04/11/2015  . Absence of bladder continence 04/11/2015  . LBP (low back pain) 04/11/2015  . Eczema intertrigo 04/11/2015  . Keratosis pilaris 04/11/2015  . Chronic recurrent major depressive disorder ([place]) 04/11/2015  . Dysmetabolic syndrome 73/71/0626  . Neurogenic claudication 04/11/2015  . Perennial allergic rhinitis with seasonal variation 04/11/2015  . Abnormal presence of protein in urine 04/11/2015  . Seborrhea capitis 04/11/2015  . Moderate dysplasia of vulva 04/11/2015  . Vitamin D deficiency 04/11/2015  . Treadmill stress test negative for angina pectoris 03/05/2015  . Shortness of breath 05/20/2014  . Morbid obesity ([place]) 02/03/2014  . Asthma, chronic 12/31/2013  . H/O total knee replacement 12/31/2013  . Episcleritis 09/26/2013  . Vocal cord dysfunction 05/28/2013  . Anxiety, generalized 03/19/2013  . Back pain 12/18/2012  . Pulmonary nodules 11/26/2012  . Lung nodule, multiple 11/26/2012  . Arthritis, degenerative 11/01/2012  . H/O aspiration pneumonitis 10/22/2012  . Postinflammatory pulmonary fibrosis ([place]) 10/22/2012  . Mixed incontinence 05/04/2012  . Benign neoplasm of pituitary gland and craniopharyngeal duct ([place]) 12/29/2010  . Lupus (systemic lupus erythematosus) ([place]) 11/05/2010  . Chronic nausea 07/01/2008  . Benign hypertension 01/25/2005    Social History  Substance Use Topics  . Smoking status: Never Smoker  . Smokeless tobacco: Never Used  . Alcohol use No    Current Outpatient Prescriptions:  .  albuterol (PROAIR HFA) 108 (90 BASE) MCG/ACT inhaler, Inhale 2 puffs into the lungs every 6 (six) hours as needed for wheezing. DX  code R06.02, Disp: 3 Inhaler, Rfl: 3 .  azelastine (OPTIVAR) 0.05 % ophthalmic solution, Place 1 drop into both eyes 2 (two) times daily., Disp: 6 mL, Rfl: 1 .  buPROPion (WELLBUTRIN XL) 300 MG 24 hr tablet, Take 1 tablet by mouth daily., Disp: , Rfl:  .  Cholecalciferol (VITAMIN D) 2000  UNITS CAPS, Take 1 capsule by mouth daily., Disp: , Rfl:  .  clonazePAM (KLONOPIN) 0.5 MG tablet, Take 0.5 tablets (0.25 mg total) by mouth daily., Disp: 15 tablet, Rfl: 0 .  cyclobenzaprine (FLEXERIL) 5 MG tablet, Take 1 tablet (5 mg total) by mouth every 8 (eight) hours as needed for muscle spasms. Do not drive for 8 hours, Disp: 90 tablet, Rfl: 1 .  diclofenac sodium (VOLTAREN) 1 % GEL, APP 2 GRAMS EXT AA QID, Disp: , Rfl: 6 .  docusate sodium (COLACE) 100 MG capsule, Take 2 capsules (200 mg total) by mouth 2 (two) times daily., Disp: 120 capsule, Rfl: 0 .  DULoxetine (CYMBALTA) 60 MG capsule, Take 120 mg by mouth at bedtime. , Disp: , Rfl:  .  FLOVENT HFA 110 MCG/ACT inhaler, INHALE 2 PUFFS PO BID, Disp: , Rfl: 0 .  fluticasone (FLONASE) 50 MCG/ACT nasal spray, USE 2 SPRAYS IN EACH  NOSTRIL DAILY, Disp: 48 g, Rfl: 1 .  gabapentin (NEURONTIN) 300 MG capsule, Take 1 capsule by mouth 3 (three) times daily., Disp: , Rfl:  .  Glycerin, Laxative, (RA GLYCERIN ADULT) 80.7 % SUPP, Place rectally., Disp: , Rfl:  .  hydroxychloroquine (PLAQUENIL) 200 MG tablet, Take 200 mg by mouth 2 (two) times daily., Disp: , Rfl:  .  lamoTRIgine (LAMICTAL) 100 MG tablet, Take 1 tablet by mouth 3 (three) times daily., Disp: , Rfl:  .  levofloxacin (LEVAQUIN) 500 MG tablet, Take 500 mg by mouth., Disp: , Rfl:  .  losartan (COZAAR) 25 MG tablet, TAKE 1 TABLET(25 MG) BY MOUTH DAILY, Disp: 90 tablet, Rfl: 0 .  losartan-hydrochlorothiazide (HYZAAR) 50-12.5 MG tablet, Take 1 tablet by mouth daily., Disp: 90 tablet, Rfl: 1 .  meclizine (ANTIVERT) 25 MG tablet, Take 1 tablet (25 mg total) by mouth 3 (three) times daily as needed., Disp: 30 tablet, Rfl: 0 .  Multiple Vitamin (MULTIVITAMIN) tablet, Take 1 tablet by mouth daily., Disp: , Rfl:  .  omeprazole (PRILOSEC) 40 MG capsule, TAKE 1 CAPSULE BY MOUTH  DAILY, Disp: 90 capsule, Rfl: 1 .  ondansetron (ZOFRAN) 4 MG tablet, Take 1 tablet (4 mg total) by mouth every 8 (eight)  hours as needed for nausea or vomiting., Disp: 60 tablet, Rfl: 0 .  polyethylene glycol powder (GLYCOLAX/MIRALAX) powder, 2 cap fulls in a full glass of water, three times a day, for 5 days., Disp: 255 g, Rfl: 0 .  ranitidine (ZANTAC) 150 MG tablet, Take 1 tablet (150 mg total) by mouth 2 (two) times daily., Disp: 60 tablet, Rfl: 0 .  topiramate (TOPAMAX) 50 MG tablet, Take 1 tablet by mouth 3 (three) times daily. Dr. [place] James - Pain management, Disp: , Rfl:  .  traZODone (DESYREL) 100 MG tablet, Take 1 tablet by mouth at bedtime., Disp: , Rfl:  .  triamcinolone ointment (KENALOG) 0.1 %, , Disp: , Rfl:  .  Vitamin D, Ergocalciferol, (DRISDOL) 50000 units CAPS capsule, TAKE 1 CAPSULE BY MOUTH EVERY 7 DAYS, Disp: 12 capsule, Rfl: 0 .  benzonatate (TESSALON) 200 MG capsule, Take 1 capsule (200 mg total) by mouth 2 (two) times daily as needed for cough. (Patient  not taking: Reported on 02/07/2017), Disp: 30 capsule, Rfl: 0 .  predniSONE (DELTASONE) 20 MG tablet, Take 30 mg by mouth daily., Disp: , Rfl:   Allergies  Allergen Reactions  . Ace Inhibitors     Other reaction(s): OTHER Pt states she can not take ace inhibitors. Pt states she can not remember her reaction.   Marland Kitchen Penicillins     ROS  Constitutional: Negative for fever or weight change.  Respiratory: Positive for cough (intermittent, hoarse voice, mucus is starting to come up) and shortness of breath (has been using Albuterol PRN for shortness of breath, tries not to use it every day).   Cardiovascular: Negative for chest pain or palpitations.  Gastrointestinal: Negative for abdominal pain, no bowel changes.  Musculoskeletal: See HPI Skin: Negative for rash.  Neurological: Negative for dizziness or headache.  No other specific complaints in a complete review of systems (except as listed in HPI above).  Objective  Vitals:   02/07/17 0939  BP: 124/86  Pulse: 100  Resp: 18  Temp: 98.7 F (37.1 C)  TempSrc: Oral  SpO2: 95%   Weight: 250 lb 6.4 oz (113.6 kg)  Height: 5\' 8"  (1.727 m)    Body mass index is 38.07 kg/m.  Nursing Note and Vital Signs reviewed.  Physical Exam  Constitutional: Patient appears well-developed and well-nourished. Obese, No distress.  HEENT: head atraumatic, normocephalic, neck supple without lymphadenopathy, oropharynx pink and moist without exudate. Cardiovascular: Normal rate, regular rhythm, S1/S2 present.  No murmur or rub heard. No BLE edema. Pulmonary/Chest: Effort normal and breath sounds clear. No respiratory distress or retractions. Hoarse voice, coughing occasionally during exam. MSK: Tenderness of MP joint on left 5th finger. ROM limited by pain, no crepititus, cap refill <3 seconds, radial pulses +2 and regular.  Psychiatric: Patient has a normal mood and affect. behavior is normal. Judgment and thought content normal.  No results found for this or any previous visit (from the past 2160 hour(s)).  Assessment & Plan  1. Community acquired pneumonia, unspecified laterality - See pulmonologist on Thursday as scheduled. - Take Flovent as prescribed and use Albuterol PRN.  2. Finger pain, left - Take Tylenol and Ice the left 5th finger daily. - Will call on Friday if not better to set up appointment with Rheumatologist.  -Red flags and when to present for emergency care or RTC including fever >101.21F, chest pain, shortness of breath, new/worsening/un-resolving symptoms, reviewed with patient at time of visit. Follow up and care instructions discussed and provided in AVS.  I have reviewed this encounter including the documentation in this note and/or discussed this patient with the Johney Maine, FNP, NP-C. I am certifying that I agree with the content of this note as supervising physician.  Steele Sizer, MD [facility] 02/12/2017, 4:29 PM

## 2017-02-07 NOTE — Patient Instructions (Signed)
Please keep your appointment with your Pulmonologist (Lung Doctor) Take Tylenol and Ice the left 5th finger daily. Please call on Friday if not better to set up appointment with Rheumatologist.

## 2017-02-09 DIAGNOSIS — J8489 Other specified interstitial pulmonary diseases: Secondary | ICD-10-CM | POA: Diagnosis not present

## 2017-02-14 ENCOUNTER — Encounter: Payer: Self-pay | Admitting: Family Medicine

## 2017-02-14 ENCOUNTER — Ambulatory Visit (INDEPENDENT_AMBULATORY_CARE_PROVIDER_SITE_OTHER): Payer: Medicare Other | Admitting: Family Medicine

## 2017-02-14 VITALS — BP 118/64 | HR 100 | Temp 98.6°F | Resp 16 | Ht 68.0 in | Wt 250.4 lb

## 2017-02-14 DIAGNOSIS — M3219 Other organ or system involvement in systemic lupus erythematosus: Secondary | ICD-10-CM

## 2017-02-14 DIAGNOSIS — E559 Vitamin D deficiency, unspecified: Secondary | ICD-10-CM | POA: Diagnosis not present

## 2017-02-14 DIAGNOSIS — F411 Generalized anxiety disorder: Secondary | ICD-10-CM | POA: Diagnosis not present

## 2017-02-14 DIAGNOSIS — E785 Hyperlipidemia, unspecified: Secondary | ICD-10-CM | POA: Diagnosis not present

## 2017-02-14 DIAGNOSIS — I10 Essential (primary) hypertension: Secondary | ICD-10-CM | POA: Diagnosis not present

## 2017-02-14 DIAGNOSIS — R739 Hyperglycemia, unspecified: Secondary | ICD-10-CM | POA: Diagnosis not present

## 2017-02-14 DIAGNOSIS — E8881 Metabolic syndrome: Secondary | ICD-10-CM | POA: Diagnosis not present

## 2017-02-14 DIAGNOSIS — F339 Major depressive disorder, recurrent, unspecified: Secondary | ICD-10-CM | POA: Diagnosis not present

## 2017-02-14 DIAGNOSIS — R42 Dizziness and giddiness: Secondary | ICD-10-CM | POA: Diagnosis not present

## 2017-02-14 MED ORDER — MECLIZINE HCL 25 MG PO TABS: 25.0000 mg | ORAL_TABLET | Freq: Three times a day (TID) | ORAL | 0 refills | Status: DC | PRN

## 2017-02-14 MED ORDER — LOSARTAN POTASSIUM-HCTZ 50-12.5 MG PO TABS: 0.5000 | ORAL_TABLET | Freq: Every day | ORAL | 0 refills | Status: DC

## 2017-02-14 MED ORDER — VITAMIN D (ERGOCALCIFEROL) 1.25 MG (50000 UNIT) PO CAPS: ORAL_CAPSULE | ORAL | 0 refills | Status: DC

## 2017-02-14 NOTE — Progress Notes (Signed)
Name: Nicole Ballard   MRN: [id number]    DOB: 28-Apr-1956   Date:02/14/2017       Progress Note  Subjective  Chief Complaint  Chief Complaint  Patient presents with  . Depression    3 month follow up  . Hyperlipidemia  . Hypertension    HPI  Lupus: sees Rheumatologist at [facility], she was diagnosed in 2006, she had episcleritis, arthralgia and positive ANA and ds DNA. 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, had CT done, tried weaning off prednisone but symptoms recurred, back on Prednisone 40 mg last week and will titrate down slowly, she will go back to pulmonologist next month. She states she still has a mild cough, she is feeling better on medication. She was also given sulfa  Obesity: she felt discourage with weight gain once she had to start prednisone, she stopped going to the gym, explained that exercise is not just for weight loss, but for well being  GAD and Major Depression: she sees Psychiatrist and therapist at [facility], taking medication, no suicidal thoughts or ideation. She has fatigue and at times anhedonia, appetite is good, sleep has been also good at this time  HTN: well controlled, no chest pain, has palpitation secondary to prednisone, she stopped taking it in May but symptoms got worse and she has been back on medication for the past week.. BP is towards low end of normal, but she does not want to stop HCTZ  Hyperlipidemia: on lifestyle modification only, we will recheck labs  Chronic constipation: she is on Miralax and is also having PT, having bowel movements are up to three times a week, better than once a week  Metabolic Syndrome: she feels thirsty, she has dry mouth, no polyphagia or polyuria.   Patient Active Problem List   Diagnosis Date Noted  . Vertigo 09/29/2016  . Bronchiolitis obliterans organizing pneumonia ([place]) 09/29/2016  . Neck pain, musculoskeletal 12/15/2015  . Anxiety about  health 09/29/2015  . Abnormal CAT scan 04/11/2015  . Auditory impairment 04/11/2015  . Insomnia, persistent 04/11/2015  . Chronic kidney disease (CKD), stage I 04/11/2015  . Chronic nonmalignant pain 04/11/2015  . Chronic constipation 04/11/2015  . Dyslipidemia 04/11/2015  . Fibromyalgia 04/11/2015  . Gastro-esophageal reflux disease without esophagitis 04/11/2015  . Dysphonia 04/11/2015  . Absence of bladder continence 04/11/2015  . LBP (low back pain) 04/11/2015  . Eczema intertrigo 04/11/2015  . Keratosis pilaris 04/11/2015  . Chronic recurrent major depressive disorder ([place]) 04/11/2015  . Dysmetabolic syndrome 09/07/7251  . Neurogenic claudication 04/11/2015  . Perennial allergic rhinitis with seasonal variation 04/11/2015  . Abnormal presence of protein in urine 04/11/2015  . Seborrhea capitis 04/11/2015  . Moderate dysplasia of vulva 04/11/2015  . Vitamin D deficiency 04/11/2015  . Treadmill stress test negative for angina pectoris 03/05/2015  . Shortness of breath 05/20/2014  . Morbid obesity ([place]) 02/03/2014  . Asthma, chronic 12/31/2013  . H/O total knee replacement 12/31/2013  . Episcleritis 09/26/2013  . Vocal cord dysfunction 05/28/2013  . Anxiety, generalized 03/19/2013  . Back pain 12/18/2012  . Pulmonary nodules 11/26/2012  . Lung nodule, multiple 11/26/2012  . Arthritis, degenerative 11/01/2012  . H/O aspiration pneumonitis 10/22/2012  . Postinflammatory pulmonary fibrosis ([place]) 10/22/2012  . Mixed incontinence 05/04/2012  . Benign neoplasm of pituitary gland and craniopharyngeal duct ([place]) 12/29/2010  . Lupus (systemic lupus erythematosus) ([place]) 11/05/2010  . Chronic nausea 07/01/2008  . Benign hypertension  01/25/2005    Past Surgical History:  Procedure Laterality Date  . BRAIN SURGERY    . CHOLECYSTECTOMY    . VAGINAL HYSTERECTOMY      Family History  Problem Relation Age of Onset  . Breast cancer Mother   . Cancer Mother 48       Breast  .  Cancer Father 41       Stomach  . Breast cancer Maternal Aunt     Social History   Social History  . Marital status: Divorced    Spouse name: N/A  . Number of children: N/A  . Years of education: N/A   Occupational History  . Not on file.   Social History Main Topics  . Smoking status: Never Smoker  . Smokeless tobacco: Never Used  . Alcohol use No  . Drug use: No  . Sexual activity: Not Currently   Other Topics Concern  . Not on file   Social History Narrative  . No narrative on file     Current Outpatient Prescriptions:  .  albuterol (PROAIR HFA) 108 (90 BASE) MCG/ACT inhaler, Inhale 2 puffs into the lungs every 6 (six) hours as needed for wheezing. DX code R06.02, Disp: 3 Inhaler, Rfl: 3 .  azelastine (OPTIVAR) 0.05 % ophthalmic solution, Place 1 drop into both eyes 2 (two) times daily., Disp: 6 mL, Rfl: 1 .  buPROPion (WELLBUTRIN XL) 300 MG 24 hr tablet, Take 1 tablet by mouth daily., Disp: , Rfl:  .  Cholecalciferol (VITAMIN D) 2000 UNITS CAPS, Take 1 capsule by mouth daily., Disp: , Rfl:  .  clonazePAM (KLONOPIN) 0.5 MG tablet, Take 0.5 tablets (0.25 mg total) by mouth daily., Disp: 15 tablet, Rfl: 0 .  cyclobenzaprine (FLEXERIL) 5 MG tablet, Take 1 tablet (5 mg total) by mouth every 8 (eight) hours as needed for muscle spasms. Do not drive for 8 hours, Disp: 90 tablet, Rfl: 1 .  diclofenac sodium (VOLTAREN) 1 % GEL, APP 2 GRAMS EXT AA QID, Disp: , Rfl: 6 .  docusate sodium (COLACE) 100 MG capsule, Take 2 capsules (200 mg total) by mouth 2 (two) times daily., Disp: 120 capsule, Rfl: 0 .  DULoxetine (CYMBALTA) 60 MG capsule, Take 120 mg by mouth at bedtime. , Disp: , Rfl:  .  FLOVENT HFA 110 MCG/ACT inhaler, INHALE 2 PUFFS PO BID, Disp: , Rfl: 0 .  fluticasone (FLONASE) 50 MCG/ACT nasal spray, USE 2 SPRAYS IN EACH  NOSTRIL DAILY, Disp: 48 g, Rfl: 1 .  gabapentin (NEURONTIN) 300 MG capsule, Take 1 capsule by mouth 3 (three) times daily., Disp: , Rfl:  .  Glycerin,  Laxative, (RA GLYCERIN ADULT) 80.7 % SUPP, Place rectally., Disp: , Rfl:  .  hydroxychloroquine (PLAQUENIL) 200 MG tablet, Take 200 mg by mouth 2 (two) times daily., Disp: , Rfl:  .  lamoTRIgine (LAMICTAL) 100 MG tablet, Take 1 tablet by mouth 3 (three) times daily., Disp: , Rfl:  .  losartan-hydrochlorothiazide (HYZAAR) 50-12.5 MG tablet, Take 0.5 tablets by mouth daily., Disp: 45 tablet, Rfl: 0 .  meclizine (ANTIVERT) 25 MG tablet, Take 1 tablet (25 mg total) by mouth 3 (three) times daily as needed., Disp: 30 tablet, Rfl: 0 .  Multiple Vitamin (MULTIVITAMIN) tablet, Take 1 tablet by mouth daily., Disp: , Rfl:  .  omeprazole (PRILOSEC) 40 MG capsule, TAKE 1 CAPSULE BY MOUTH  DAILY, Disp: 90 capsule, Rfl: 1 .  ondansetron (ZOFRAN) 4 MG tablet, Take 1 tablet (4 mg total) by mouth  every 8 (eight) hours as needed for nausea or vomiting., Disp: 60 tablet, Rfl: 0 .  polyethylene glycol powder (GLYCOLAX/MIRALAX) powder, 2 cap fulls in a full glass of water, three times a day, for 5 days., Disp: 255 g, Rfl: 0 .  topiramate (TOPAMAX) 50 MG tablet, Take 1 tablet by mouth 3 (three) times daily. Dr. [place] James - Pain management, Disp: , Rfl:  .  traZODone (DESYREL) 100 MG tablet, Take 1 tablet by mouth at bedtime., Disp: , Rfl:  .  triamcinolone ointment (KENALOG) 0.1 %, , Disp: , Rfl:  .  Vitamin D, Ergocalciferol, (DRISDOL) 50000 units CAPS capsule, TAKE 1 CAPSULE BY MOUTH EVERY 7 DAYS, Disp: 12 capsule, Rfl: 0  Allergies  Allergen Reactions  . Ace Inhibitors     Other reaction(s): OTHER Pt states she can not take ace inhibitors. Pt states she can not remember her reaction.   Marland Kitchen Penicillins      ROS  Constitutional: Negative for fever or weight change.  Respiratory: Positive for cough no shortness of breath.   Cardiovascular: Negative for chest pain or palpitations.  Gastrointestinal: Negative for abdominal pain, no bowel changes.  Musculoskeletal: Negative for gait problem or joint swelling.   Skin: Negative for rash.  Neurological: Positive  for dizziness intermittently but headache.  No other specific complaints in a complete review of systems (except as listed in HPI above).  Objective  Vitals:   02/14/17 1143  BP: 118/64  Pulse: 100  Resp: 16  Temp: 98.6 F (37 C)  SpO2: 95%  Weight: 250 lb 6 oz (113.6 kg)  Height: 5\' 8"  (1.727 m)    Body mass index is 38.07 kg/m.  Physical Exam  Constitutional: Patient appears well-developed and well-nourished. Obese No distress.  HEENT: head atraumatic, normocephalic, pupils equal and reactive to light,neck supple, throat within normal limits Cardiovascular: Normal rate, regular rhythm and normal heart sounds.  No murmur heard. No BLE edema. Pulmonary/Chest: Effort normal , she has crackles on right lower lung base.No respiratory distress. Abdominal: Soft.  There is no tenderness. Psychiatric: Patient has a normal mood and affect. behavior is normal. Judgment and thought content normal.  PHQ2/9: Depression screen [facility] 2/9 10/31/2016 07/12/2016 04/29/2016 01/28/2016 12/30/2015  Decreased Interest 0 0 0 0 0  Down, Depressed, Hopeless 0 0 1 0 0  PHQ - 2 Score 0 0 1 0 0     Fall Risk: Fall Risk  10/31/2016 07/12/2016 04/29/2016 01/28/2016 12/30/2015  Falls in the past year? Yes No No No No  Number falls in past yr: 1 - - - -  Injury with Fall? No - - - -      Assessment & Plan  1. Benign hypertension  She prefers continue with fluid pill, so we will change to half pill daily since bp is towards low end of normal - losartan-hydrochlorothiazide (HYZAAR) 50-12.5 MG tablet; Take 0.5 tablets by mouth daily.  Dispense: 45 tablet; Refill: 0 - COMPLETE METABOLIC PANEL WITH GFR  2. Dyslipidemia  - Lipid panel  3. Anxiety, generalized  Continue follow up with psychiatrist  4. Chronic recurrent major depressive disorder (HCC)  Stable, continue with medication   5. Morbid obesity ([place])  Discussed with the patient the risk  posed by an increased BMI. Discussed importance of portion control, calorie counting and at least 150 minutes of physical activity weekly. Avoid sweet beverages and drink more water. Eat at least 6 servings of fruit and vegetables daily  - Hemoglobin A1c  6. Vertigo  - meclizine (ANTIVERT) 25 MG tablet; Take 1 tablet (25 mg total) by mouth 3 (three) times daily as needed.  Dispense: 30 tablet; Refill: 0  7. Vitamin D deficiency  - Vitamin D, Ergocalciferol, (DRISDOL) 50000 units CAPS capsule; TAKE 1 CAPSULE BY MOUTH EVERY 7 DAYS  Dispense: 12 capsule; Refill: 0  8. Metabolic syndrome  - Insulin, fasting  9. Hyperglycemia  - Hemoglobin A1c - Insulin, fasting   10. Systemic lupus erythematosus with other organ involvement, unspecified SLE type ([place])  Stable, having eosinophilic pneumonia with recurrence after she tapered prednisone. Pulmonologist is discussing it with Rheumatologist

## 2017-02-17 LAB — COMPLETE METABOLIC PANEL WITH GFR
ALBUMIN: 3.5 g/dL — AB (ref 3.6–5.1)
ALK PHOS: 65 U/L (ref 33–130)
ALT: 13 U/L (ref 6–29)
AST: 14 U/L (ref 10–35)
BILIRUBIN TOTAL: 0.2 mg/dL (ref 0.2–1.2)
BUN: 14 mg/dL (ref 7–25)
CO2: 32 mmol/L — ABNORMAL HIGH (ref 20–31)
Calcium: 9.1 mg/dL (ref 8.6–10.4)
Chloride: 102 mmol/L (ref 98–110)
Creat: 0.95 mg/dL (ref 0.50–1.05)
GFR, Est African American: 76 mL/min (ref >=60)
GFR, Est Non African American: 66 mL/min (ref >=60)
GLUCOSE: 67 mg/dL (ref 65–99)
Potassium: 4.8 mmol/L (ref 3.5–5.3)
SODIUM: 137 mmol/L (ref 135–146)
TOTAL PROTEIN: 7.7 g/dL (ref 6.1–8.1)

## 2017-02-17 LAB — LIPID PANEL
Cholesterol: 165 mg/dL (ref <200)
HDL: 73 mg/dL (ref >50)
LDL Cholesterol: 82 mg/dL (ref <100)
Total CHOL/HDL Ratio: 2.3 Ratio (ref <5.0)
Triglycerides: 51 mg/dL (ref <150)
VLDL: 10 mg/dL (ref <30)

## 2017-02-17 LAB — HEMOGLOBIN A1C
Hgb A1c MFr Bld: 6.1 % — ABNORMAL HIGH (ref <5.7)
Mean Plasma Glucose: 128 mg/dL

## 2017-02-17 LAB — INSULIN, FASTING: Insulin fasting, serum: 21.5 u[IU]/mL — ABNORMAL HIGH (ref 2.0–19.6)

## 2017-03-14 ENCOUNTER — Ambulatory Visit
Admission: RE | Admit: 2017-03-14 | Discharge: 2017-03-14 | Payer: MEDICARE | Attending: Nephrology | Admitting: Nephrology

## 2017-03-14 DIAGNOSIS — M3219 Other organ or system involvement in systemic lupus erythematosus: Secondary | ICD-10-CM | POA: Diagnosis not present

## 2017-03-14 DIAGNOSIS — R809 Proteinuria, unspecified: Secondary | ICD-10-CM | POA: Diagnosis not present

## 2017-03-27 ENCOUNTER — Ambulatory Visit
Admission: RE | Admit: 2017-03-27 | Discharge: 2017-03-27 | Disposition: A | Discharge status: Home / Self Care | Payer: MEDICARE | Attending: Critical Care Medicine | Admitting: Critical Care Medicine

## 2017-03-27 ENCOUNTER — Ambulatory Visit
Admission: RE | Admit: 2017-03-27 | Discharge: 2017-03-27 | Disposition: A | Discharge status: Home / Self Care | Payer: MEDICARE

## 2017-03-27 DIAGNOSIS — Z7951 Long term (current) use of inhaled steroids: Secondary | ICD-10-CM | POA: Diagnosis not present

## 2017-03-27 DIAGNOSIS — M7989 Other specified soft tissue disorders: Secondary | ICD-10-CM | POA: Diagnosis not present

## 2017-03-27 DIAGNOSIS — M1990 Unspecified osteoarthritis, unspecified site: Secondary | ICD-10-CM | POA: Diagnosis not present

## 2017-03-27 DIAGNOSIS — Z888 Allergy status to other drugs, medicaments and biological substances status: Secondary | ICD-10-CM | POA: Diagnosis not present

## 2017-03-27 DIAGNOSIS — Z880 Allergy status to penicillin: Secondary | ICD-10-CM | POA: Diagnosis not present

## 2017-03-27 DIAGNOSIS — K219 Gastro-esophageal reflux disease without esophagitis: Secondary | ICD-10-CM | POA: Diagnosis not present

## 2017-03-27 DIAGNOSIS — K5909 Other constipation: Secondary | ICD-10-CM | POA: Diagnosis not present

## 2017-03-27 DIAGNOSIS — M3213 Lung involvement in systemic lupus erythematosus: Secondary | ICD-10-CM | POA: Diagnosis not present

## 2017-03-27 DIAGNOSIS — M797 Fibromyalgia: Secondary | ICD-10-CM | POA: Diagnosis not present

## 2017-03-27 DIAGNOSIS — I129 Hypertensive chronic kidney disease with stage 1 through stage 4 chronic kidney disease, or unspecified chronic kidney disease: Secondary | ICD-10-CM | POA: Diagnosis not present

## 2017-03-27 DIAGNOSIS — Z79899 Other long term (current) drug therapy: Secondary | ICD-10-CM | POA: Diagnosis not present

## 2017-03-27 DIAGNOSIS — R918 Other nonspecific abnormal finding of lung field: Secondary | ICD-10-CM | POA: Diagnosis not present

## 2017-03-27 DIAGNOSIS — M79641 Pain in right hand: Secondary | ICD-10-CM | POA: Diagnosis not present

## 2017-03-27 DIAGNOSIS — J8489 Other specified interstitial pulmonary diseases: Secondary | ICD-10-CM | POA: Diagnosis not present

## 2017-03-27 DIAGNOSIS — Z7952 Long term (current) use of systemic steroids: Secondary | ICD-10-CM | POA: Diagnosis not present

## 2017-03-27 DIAGNOSIS — N181 Chronic kidney disease, stage 1: Secondary | ICD-10-CM | POA: Diagnosis not present

## 2017-03-27 DIAGNOSIS — G894 Chronic pain syndrome: Secondary | ICD-10-CM | POA: Diagnosis not present

## 2017-03-27 DIAGNOSIS — M79642 Pain in left hand: Secondary | ICD-10-CM | POA: Diagnosis not present

## 2017-03-27 MED ORDER — PREDNISONE 10 MG TABLET
ORAL_TABLET | Freq: Every day | ORAL | 2 refills | 0.00000 days | Status: CP
Start: 2017-03-27 — End: 2017-05-26

## 2017-03-27 MED ORDER — SULFAMETHOXAZOLE 400 MG-TRIMETHOPRIM 80 MG TABLET
ORAL_TABLET | Freq: Every day | ORAL | 0 refills | 0 days | Status: CP
Start: 2017-03-27 — End: 2017-05-03

## 2017-03-28 MED ORDER — MYCOPHENOLATE MOFETIL 500 MG TABLET
ORAL_TABLET | Freq: Two times a day (BID) | ORAL | 3 refills | 0.00000 days | Status: CP
Start: 2017-03-28 — End: 2017-04-27

## 2017-03-31 ENCOUNTER — Other Ambulatory Visit: Payer: Self-pay | Admitting: Family Medicine

## 2017-03-31 DIAGNOSIS — K219 Gastro-esophageal reflux disease without esophagitis: Secondary | ICD-10-CM

## 2017-03-31 MED ORDER — OMEPRAZOLE 40 MG PO CPDR: 40.0000 mg | DELAYED_RELEASE_CAPSULE | Freq: Every day | ORAL | 1 refills | Status: DC

## 2017-03-31 NOTE — Telephone Encounter (Signed)
PT MEEDS REFILL ON HER ACID REFLUX AND SHE IS  OUT. PHARM IS [facility]. WILL NEED A DEW TO GO TO HER LOCAL PHARM [facility] TILL THE MAIL ORDER COMES.

## 2017-04-05 ENCOUNTER — Ambulatory Visit
Admission: RE | Admit: 2017-04-05 | Discharge: 2017-04-05 | Disposition: A | Discharge status: Home / Self Care | Admitting: Anesthesiology

## 2017-04-05 DIAGNOSIS — M48061 Spinal stenosis, lumbar region without neurogenic claudication: Secondary | ICD-10-CM | POA: Diagnosis not present

## 2017-04-05 DIAGNOSIS — M545 Low back pain: Secondary | ICD-10-CM | POA: Diagnosis not present

## 2017-04-05 DIAGNOSIS — G894 Chronic pain syndrome: Secondary | ICD-10-CM | POA: Diagnosis not present

## 2017-04-05 DIAGNOSIS — M542 Cervicalgia: Secondary | ICD-10-CM | POA: Diagnosis not present

## 2017-04-05 DIAGNOSIS — M797 Fibromyalgia: Secondary | ICD-10-CM | POA: Diagnosis not present

## 2017-04-05 DIAGNOSIS — G8929 Other chronic pain: Secondary | ICD-10-CM | POA: Diagnosis not present

## 2017-04-05 DIAGNOSIS — F329 Major depressive disorder, single episode, unspecified: Principal | ICD-10-CM

## 2017-04-05 DIAGNOSIS — F411 Generalized anxiety disorder: Secondary | ICD-10-CM

## 2017-04-05 MED ORDER — GABAPENTIN 300 MG CAPSULE
ORAL_CAPSULE | ORAL | 0 refills | 0.00000 days | Status: CP
Start: 2017-04-05 — End: 2017-06-07

## 2017-04-05 MED ORDER — GABAPENTIN 300 MG CAPSULE: capsule | 0 refills | 0 days | Status: AC

## 2017-04-05 MED ORDER — RANITIDINE 150 MG TABLET
ORAL_TABLET | Freq: Two times a day (BID) | ORAL | 2 refills | 0.00000 days | Status: CP
Start: 2017-04-05 — End: 2017-04-05

## 2017-04-05 MED ORDER — RANITIDINE 150 MG TABLET: 150 mg | tablet | Freq: Two times a day (BID) | 2 refills | 0 days | Status: AC

## 2017-04-24 ENCOUNTER — Ambulatory Visit
Admission: RE | Admit: 2017-04-24 | Discharge: 2017-04-24 | Disposition: A | Discharge status: Home / Self Care | Payer: MEDICARE | Attending: Psychologist | Admitting: Psychologist

## 2017-04-24 DIAGNOSIS — M3213 Lung involvement in systemic lupus erythematosus: Secondary | ICD-10-CM | POA: Diagnosis not present

## 2017-04-24 DIAGNOSIS — F4310 Post-traumatic stress disorder, unspecified: Secondary | ICD-10-CM

## 2017-04-24 DIAGNOSIS — F411 Generalized anxiety disorder: Principal | ICD-10-CM

## 2017-04-24 DIAGNOSIS — F331 Major depressive disorder, recurrent, moderate: Secondary | ICD-10-CM

## 2017-04-26 ENCOUNTER — Encounter: Payer: Self-pay | Admitting: Family Medicine

## 2017-04-26 ENCOUNTER — Ambulatory Visit (INDEPENDENT_AMBULATORY_CARE_PROVIDER_SITE_OTHER): Payer: Medicare Other | Admitting: Family Medicine

## 2017-04-26 VITALS — BP 138/78 | HR 102 | Temp 98.4°F | Resp 18 | Ht 68.0 in | Wt 273.0 lb

## 2017-04-26 DIAGNOSIS — F339 Major depressive disorder, recurrent, unspecified: Secondary | ICD-10-CM | POA: Diagnosis not present

## 2017-04-26 DIAGNOSIS — L309 Dermatitis, unspecified: Secondary | ICD-10-CM

## 2017-04-26 DIAGNOSIS — H1013 Acute atopic conjunctivitis, bilateral: Secondary | ICD-10-CM

## 2017-04-26 DIAGNOSIS — E8881 Metabolic syndrome: Secondary | ICD-10-CM

## 2017-04-26 MED ORDER — PIMECROLIMUS 1 % EX CREA: TOPICAL_CREAM | Freq: Two times a day (BID) | CUTANEOUS | 0 refills | Status: DC

## 2017-04-26 NOTE — Progress Notes (Signed)
Name: Nicole Ballard   MRN: [id number]    DOB: Feb 24, 1956   Date:04/26/2017       Progress Note  Subjective  Chief Complaint  Chief Complaint  Patient presents with  . Rash    face,eyes, and neck itching for 6 days     HPI  Allergic reaction: she was switched from high dose prednisone and Plaquenil for Lupus and BOOP to Cellcept two weeks ago, she noticed increase in pruritis and dryness in both eyes, and also some skin exfoliation since started on the medication, symptoms are getting and developed itching all over her face. She contact her pulmonologist and was advised to take Benadryl, she states medication is not helping. She stopped taking the Cellcept and still has symptoms, without improvement. Explained that she needs to continue allergy drops given by ophthalmologist. She needs to call back her Rheumatologist since she could not tolerate the new medication and has been unable to get a hold of her Pulmonologist. Rash has spread to anterior chest.   Weight gain: she has gained 25 lbs in the past 2 months, she was on high dose prednisone ( off two weeks ago), eating more than usual, trying to stay physical active but has SOB that makes it difficulty to exercise  Depression: she  Seems to be doing better emotionally, being more of her own advocate and more talkative, she states concerned about side effects of new medication. No crying spells, suicidal thoughts or ideation    Patient Active Problem List   Diagnosis Date Noted  . Vertigo 09/29/2016  . Bronchiolitis obliterans organizing pneumonia ([place]) 09/29/2016  . Neck pain, musculoskeletal 12/15/2015  . Anxiety about health 09/29/2015  . Abnormal CAT scan 04/11/2015  . Auditory impairment 04/11/2015  . Insomnia, persistent 04/11/2015  . Chronic kidney disease (CKD), stage I 04/11/2015  . Chronic nonmalignant pain 04/11/2015  . Chronic constipation 04/11/2015  . Dyslipidemia 04/11/2015  . Fibromyalgia 04/11/2015  .  Gastro-esophageal reflux disease without esophagitis 04/11/2015  . Dysphonia 04/11/2015  . Absence of bladder continence 04/11/2015  . LBP (low back pain) 04/11/2015  . Eczema intertrigo 04/11/2015  . Keratosis pilaris 04/11/2015  . Chronic recurrent major depressive disorder ([place]) 04/11/2015  . Dysmetabolic syndrome 37/90/2409  . Neurogenic claudication 04/11/2015  . Perennial allergic rhinitis with seasonal variation 04/11/2015  . Abnormal presence of protein in urine 04/11/2015  . Seborrhea capitis 04/11/2015  . Moderate dysplasia of vulva 04/11/2015  . Vitamin D deficiency 04/11/2015  . Treadmill stress test negative for angina pectoris 03/05/2015  . Shortness of breath 05/20/2014  . Morbid obesity ([place]) 02/03/2014  . Asthma, chronic 12/31/2013  . H/O total knee replacement 12/31/2013  . Episcleritis 09/26/2013  . Vocal cord dysfunction 05/28/2013  . Anxiety, generalized 03/19/2013  . Back pain 12/18/2012  . Pulmonary nodules 11/26/2012  . Lung nodule, multiple 11/26/2012  . Arthritis, degenerative 11/01/2012  . H/O aspiration pneumonitis 10/22/2012  . Postinflammatory pulmonary fibrosis ([place]) 10/22/2012  . Mixed incontinence 05/04/2012  . Benign neoplasm of pituitary gland and craniopharyngeal duct ([place]) 12/29/2010  . Systemic lupus erythematosus ([place]) 11/05/2010  . Chronic nausea 07/01/2008  . Benign hypertension 01/25/2005    Past Surgical History:  Procedure Laterality Date  . BRAIN SURGERY    . CHOLECYSTECTOMY    . VAGINAL HYSTERECTOMY      Family History  Problem Relation Age of Onset  . Breast cancer Mother   . Cancer Mother 78       Breast  .  Cancer Father 16       Stomach  . Breast cancer Maternal Aunt     Social History   Social History  . Marital status: Divorced    Spouse name: N/A  . Number of children: N/A  . Years of education: N/A   Occupational History  . Not on file.   Social History Main Topics  . Smoking status: Never Smoker  .  Smokeless tobacco: Never Used  . Alcohol use No  . Drug use: No  . Sexual activity: Not Currently   Other Topics Concern  . Not on file   Social History Narrative  . No narrative on file     Current Outpatient Prescriptions:  .  albuterol (PROAIR HFA) 108 (90 BASE) MCG/ACT inhaler, Inhale 2 puffs into the lungs every 6 (six) hours as needed for wheezing. DX code R06.02, Disp: 3 Inhaler, Rfl: 3 .  azelastine (OPTIVAR) 0.05 % ophthalmic solution, Place 1 drop into both eyes 2 (two) times daily., Disp: 6 mL, Rfl: 1 .  buPROPion (WELLBUTRIN XL) 300 MG 24 hr tablet, Take 1 tablet by mouth daily., Disp: , Rfl:  .  Cholecalciferol (VITAMIN D) 2000 UNITS CAPS, Take 1 capsule by mouth daily., Disp: , Rfl:  .  clonazePAM (KLONOPIN) 0.5 MG tablet, Take 0.5 tablets (0.25 mg total) by mouth daily., Disp: 15 tablet, Rfl: 0 .  cyclobenzaprine (FLEXERIL) 5 MG tablet, Take 1 tablet (5 mg total) by mouth every 8 (eight) hours as needed for muscle spasms. Do not drive for 8 hours, Disp: 90 tablet, Rfl: 1 .  diclofenac sodium (VOLTAREN) 1 % GEL, APP 2 GRAMS EXT AA QID, Disp: , Rfl: 6 .  docusate sodium (COLACE) 100 MG capsule, Take 2 capsules (200 mg total) by mouth 2 (two) times daily., Disp: 120 capsule, Rfl: 0 .  DULoxetine (CYMBALTA) 60 MG capsule, Take 120 mg by mouth at bedtime. , Disp: , Rfl:  .  FLOVENT HFA 110 MCG/ACT inhaler, INHALE 2 PUFFS PO BID, Disp: , Rfl: 0 .  fluticasone (FLONASE) 50 MCG/ACT nasal spray, USE 2 SPRAYS IN EACH  NOSTRIL DAILY, Disp: 48 g, Rfl: 1 .  gabapentin (NEURONTIN) 300 MG capsule, Take 1 capsule by mouth 3 (three) times daily., Disp: , Rfl:  .  Glycerin, Laxative, (RA GLYCERIN ADULT) 80.7 % SUPP, Place rectally., Disp: , Rfl:  .  hydroxychloroquine (PLAQUENIL) 200 MG tablet, Take 200 mg by mouth 2 (two) times daily., Disp: , Rfl:  .  lamoTRIgine (LAMICTAL) 100 MG tablet, Take 1 tablet by mouth 3 (three) times daily., Disp: , Rfl:  .  losartan-hydrochlorothiazide (HYZAAR)  50-12.5 MG tablet, Take 0.5 tablets by mouth daily., Disp: 45 tablet, Rfl: 0 .  meclizine (ANTIVERT) 25 MG tablet, Take 1 tablet (25 mg total) by mouth 3 (three) times daily as needed., Disp: 30 tablet, Rfl: 0 .  Multiple Vitamin (MULTIVITAMIN) tablet, Take 1 tablet by mouth daily., Disp: , Rfl:  .  omeprazole (PRILOSEC) 40 MG capsule, Take 1 capsule (40 mg total) by mouth daily., Disp: 90 capsule, Rfl: 1 .  ondansetron (ZOFRAN) 4 MG tablet, Take 1 tablet (4 mg total) by mouth every 8 (eight) hours as needed for nausea or vomiting., Disp: 60 tablet, Rfl: 0 .  polyethylene glycol powder (GLYCOLAX/MIRALAX) powder, 2 cap fulls in a full glass of water, three times a day, for 5 days., Disp: 255 g, Rfl: 0 .  topiramate (TOPAMAX) 50 MG tablet, Take 1 tablet by mouth 3 (three)  times daily. Dr. [place] James - Pain management, Disp: , Rfl:  .  traZODone (DESYREL) 100 MG tablet, Take 1 tablet by mouth at bedtime., Disp: , Rfl:  .  triamcinolone ointment (KENALOG) 0.1 %, , Disp: , Rfl:  .  Vitamin D, Ergocalciferol, (DRISDOL) 50000 units CAPS capsule, TAKE 1 CAPSULE BY MOUTH EVERY 7 DAYS, Disp: 12 capsule, Rfl: 0  Allergies  Allergen Reactions  . Ace Inhibitors     Other reaction(s): OTHER Pt states she can not take ace inhibitors. Pt states she can not remember her reaction.   Marland Kitchen Penicillins      ROS  Ten systems reviewed and is negative except as mentioned in HPI   Objective  Vitals:   04/26/17 1122  BP: 138/78  Pulse: (!) 102  Resp: 18  Temp: 98.4 F (36.9 C)  SpO2: 90%  Weight: 273 lb (123.8 kg)  Height: 5\' 8"  (1.727 m)    Body mass index is 41.51 kg/m.  Physical Exam  Constitutional: Patient appears well-developed and well-nourished. Obese No distress.  HEENT: head atraumatic, normocephalic, pupils equal and reactive to light, injected conjunctiva bilaterally neck supple, throat within normal limits Cardiovascular: Normal rate, regular rhythm and normal heart sounds.  No  murmur heard. No BLE edema. Pulmonary/Chest: Effort normal and breath sounds normal. No respiratory distress. Abdominal: Soft.  There is no tenderness. Psychiatric: Patient has a normal mood and affect. behavior is normal. Judgment and thought content normal. Skin: pilling of face no erythema, eye lids are irritated.   Recent Results (from the past 2160 hour(s))  Lipid panel     Status: None   Collection Time: 02/14/17  9:58 AM  Result Value Ref Range   Cholesterol 165 <200 mg/dL   Triglycerides 51 <150 mg/dL   HDL 73 >50 mg/dL   Total CHOL/HDL Ratio 2.3 <5.0 Ratio   VLDL 10 <30 mg/dL   LDL Cholesterol 82 <100 mg/dL  COMPLETE METABOLIC PANEL WITH GFR     Status: Abnormal   Collection Time: 02/14/17  9:58 AM  Result Value Ref Range   Sodium 137 135 - 146 mmol/L   Potassium 4.8 3.5 - 5.3 mmol/L   Chloride 102 98 - 110 mmol/L   CO2 32 (H) 20 - 31 mmol/L   Glucose, Bld 67 65 - 99 mg/dL   BUN 14 7 - 25 mg/dL   Creat 0.95 0.50 - 1.05 mg/dL    Comment:   For patients > or = 59 years of age: The upper reference limit for Creatinine is approximately 13% higher for people identified as African-American.      Total Bilirubin 0.2 0.2 - 1.2 mg/dL   Alkaline Phosphatase 65 33 - 130 U/L   AST 14 10 - 35 U/L   ALT 13 6 - 29 U/L   Total Protein 7.7 6.1 - 8.1 g/dL   Albumin 3.5 (L) 3.6 - 5.1 g/dL   Calcium 9.1 8.6 - 10.4 mg/dL   GFR, Est African American 76 >=60 mL/min   GFR, Est Non African American 66 >=60 mL/min  Hemoglobin A1c     Status: Abnormal   Collection Time: 02/14/17  9:58 AM  Result Value Ref Range   Hgb A1c MFr Bld 6.1 (H) <5.7 %    Comment:   For someone without known diabetes, a hemoglobin A1c value between 5.7% and 6.4% is consistent with prediabetes and should be confirmed with a follow-up test.   For someone with known diabetes, a value <7% indicates  that their diabetes is well controlled. A1c targets should be individualized based on duration of diabetes, age,  co-morbid conditions and other considerations.   This assay result is consistent with an increased risk of diabetes.   Currently, no consensus exists regarding use of hemoglobin A1c for diagnosis of diabetes in children.      Mean Plasma Glucose 128 mg/dL  Insulin, fasting     Status: Abnormal   Collection Time: 02/14/17  9:58 AM  Result Value Ref Range   Insulin fasting, serum 21.5 (H) 2.0 - 19.6 uIU/mL    Comment:   This insulin assay shows strong cross-reactivity for some insulin analogs (lispro, aspart, and glargine) and much lower cross-reactivity with others (detemir, glulisine).   Stimulated Insulin reference intervals were established using the Siemens Immulite assay. These values are provided for general guidance only.      PHQ2/9: Depression screen [facility] 2/9 10/31/2016 07/12/2016 04/29/2016 01/28/2016 12/30/2015  Decreased Interest 0 0 0 0 0  Down, Depressed, Hopeless 0 0 1 0 0  PHQ - 2 Score 0 0 1 0 0    Fall Risk: Fall Risk  10/31/2016 07/12/2016 04/29/2016 01/28/2016 12/30/2015  Falls in the past year? Yes No No No No  Number falls in past yr: 1 - - - -  Injury with Fall? No - - - -     Assessment & Plan  1. Dermatitis of face  - pimecrolimus (ELIDEL) 1 % cream; Apply topically 2 (two) times daily.  Dispense: 30 g; Refill: 0 Avoid steroid on face,  2. Allergic conjunctivitis of both eyes  Resume eye drops given by ophthalmologist  Follow up with ophthalmologist if no improvement   3. Chronic recurrent major depressive disorder [facility])  Doing well, continue follow up with psychiatrist and therapist   4. Morbid obesity ([place])  Discussed with the patient the risk posed by an increased BMI. Discussed importance of portion control, calorie counting and at least 150 minutes of physical activity weekly. Avoid sweet beverages and drink more water. Eat at least 6 servings of fruit and vegetables daily  Discussed GLP-1 agonist

## 2017-04-26 NOTE — Patient Instructions (Signed)
Liraglutide injection What is this medicine? LIRAGLUTIDE (LIR a GLOO tide) is used to improve blood sugar control in adults with type 2 diabetes. This medicine may be used with other diabetes medicines. This drug may also reduce the risk of heart attack or stroke if you have type 2 diabetes and risk factors for heart disease. This medicine may be used for other purposes; ask your health care provider or pharmacist if you have questions. COMMON BRAND NAME(S): Victoza What should I tell my health care provider before I take this medicine? They need to know if you have any of these conditions: -endocrine tumors (MEN 2) or if someone in your family had these tumors -gallbladder disease -high cholesterol -history of alcohol abuse problem -history of pancreatitis -kidney disease or if you are on dialysis -liver disease -previous swelling of the tongue, face, or lips with difficulty breathing, difficulty swallowing, hoarseness, or tightening of the throat -stomach problems -thyroid cancer or if someone in your family had thyroid cancer -an unusual or allergic reaction to liraglutide, other medicines, foods, dyes, or preservatives -pregnant or trying to get pregnant -breast-feeding How should I use this medicine? This medicine is for injection under the skin of your upper leg, stomach area, or upper arm. You will be taught how to prepare and give this medicine. Use exactly as directed. Take your medicine at regular intervals. Do not take it more often than directed. It is important that you put your used needles and syringes in a special sharps container. Do not put them in a trash can. If you do not have a sharps container, call your pharmacist or healthcare provider to get one. A special MedGuide will be given to you by the pharmacist with each prescription and refill. Be sure to read this information carefully each time. Talk to your pediatrician regarding the use of this medicine in children.  Special care may be needed. Overdosage: If you think you have taken too much of this medicine contact a poison control center or emergency room at once. NOTE: This medicine is only for you. Do not share this medicine with others. What if I miss a dose? If you miss a dose, take it as soon as you can. If it is almost time for your next dose, take only that dose. Do not take double or extra doses. What may interact with this medicine? -other medicines for diabetes Many medications may cause changes in blood sugar, these include: -alcohol containing beverages -antiviral medicines for HIV or AIDS -aspirin and aspirin-like drugs -certain medicines for blood pressure, heart disease, irregular heart beat -chromium -diuretics -female hormones, such as estrogens or progestins, birth control pills -fenofibrate -gemfibrozil -isoniazid -lanreotide -female hormones or anabolic steroids -MAOIs like Carbex, Eldepryl, Marplan, Nardil, and Parnate -medicines for weight loss -medicines for allergies, asthma, cold, or cough -medicines for depression, anxiety, or psychotic disturbances -niacin -nicotine -NSAIDs, medicines for pain and inflammation, like ibuprofen or naproxen -octreotide -pasireotide -pentamidine -phenytoin -probenecid -quinolone antibiotics such as ciprofloxacin, levofloxacin, ofloxacin -some herbal dietary supplements -steroid medicines such as prednisone or cortisone -sulfamethoxazole; trimethoprim -thyroid hormones Some medications can hide the warning symptoms of low blood sugar (hypoglycemia). You may need to monitor your blood sugar more closely if you are taking one of these medications. These include: -beta-blockers, often used for high blood pressure or heart problems (examples include atenolol, metoprolol, propranolol) -clonidine -guanethidine -reserpine This list may not describe all possible interactions. Give your health care provider a list of all the medicines,    herbs, non-prescription drugs, or dietary supplements you use. Also tell them if you smoke, drink alcohol, or use illegal drugs. Some items may interact with your medicine. What should I watch for while using this medicine? Visit your doctor or health care professional for regular checks on your progress. Drink plenty of fluids while taking this medicine. Check with your doctor or health care professional if you get an attack of severe diarrhea, nausea, and vomiting. The loss of too much body fluid can make it dangerous for you to take this medicine. A test called the HbA1C (A1C) will be monitored. This is a simple blood test. It measures your blood sugar control over the last 2 to 3 months. You will receive this test every 3 to 6 months. Learn how to check your blood sugar. Learn the symptoms of low and high blood sugar and how to manage them. Always carry a quick-source of sugar with you in case you have symptoms of low blood sugar. Examples include hard sugar candy or glucose tablets. Make sure others know that you can choke if you eat or drink when you develop serious symptoms of low blood sugar, such as seizures or unconsciousness. They must get medical help at once. Tell your doctor or health care professional if you have high blood sugar. You might need to change the dose of your medicine. If you are sick or exercising more than usual, you might need to change the dose of your medicine. Do not skip meals. Ask your doctor or health care professional if you should avoid alcohol. Many nonprescription cough and cold products contain sugar or alcohol. These can affect blood sugar. Pens should never be shared. Even if the needle is changed, sharing may result in passing of viruses like hepatitis or HIV. Wear a medical ID bracelet or chain, and carry a card that describes your disease and details of your medicine and dosage times. What side effects may I notice from receiving this medicine? Side effects  that you should report to your doctor or health care professional as soon as possible: -allergic reactions like skin rash, itching or hives, swelling of the face, lips, or tongue -breathing problems -diarrhea that continues or is severe -lump or swelling on the neck -severe nausea -signs and symptoms of infection like fever or chills; cough; sore throat; pain or trouble passing urine -signs and symptoms of low blood sugar such as feeling anxious, confusion, dizziness, increased hunger, unusually weak or tired, sweating, shakiness, cold, irritable, headache, blurred vision, fast heartbeat, loss of consciousness -signs and symptoms of kidney injury like trouble passing urine or change in the amount of urine -trouble swallowing -unusual stomach upset or pain -vomiting Side effects that usually do not require medical attention (report to your doctor or health care professional if they continue or are bothersome): -constipation -decreased appetite -diarrhea -fatigue -headache -nausea -pain, redness, or irritation at site where injected -stomach upset -stuffy or runny nose This list may not describe all possible side effects. Call your doctor for medical advice about side effects. You may report side effects to FDA at 1-800-FDA-1088. Where should I keep my medicine? Keep out of the reach of children. Store unopened pen in a refrigerator between 2 and 8 degrees C (36 and 46 degrees F). Do not freeze or use if the medicine has been frozen. Protect from light and excessive heat. After you first use the pen, it can be stored at room temperature between 15 and 30 degrees C (59 and   86 degrees F) or in a refrigerator. Throw away your used pen after 30 days or after the expiration date, whichever comes first. Do not store your pen with the needle attached. If the needle is left on, medicine may leak from the pen. NOTE: This sheet is a summary. It may not cover all possible information. If you have  questions about this medicine, talk to your doctor, pharmacist, or health care provider.  2018 Elsevier/Gold Standard (2016-09-08 14:39:40)  

## 2017-04-27 ENCOUNTER — Telehealth: Payer: Self-pay

## 2017-04-27 NOTE — Telephone Encounter (Signed)
[facility] faxed [place] that Elidel is not covered since patient has Medicare Part D. Please change to Ala-cort, Augmented betamethasone, Desonide ointment, Fluocinonide and Hydrocortisone 2.5 cream. Please change to preferred medication. Thanks.

## 2017-04-27 NOTE — Telephone Encounter (Signed)
She needs to use this on her face, and eyelid, none of those are options. Increase risk of cataracts. Can we try PA?

## 2017-04-28 ENCOUNTER — Ambulatory Visit (INDEPENDENT_AMBULATORY_CARE_PROVIDER_SITE_OTHER): Payer: Medicare Other | Admitting: Family Medicine

## 2017-04-28 ENCOUNTER — Encounter: Payer: Self-pay | Admitting: Family Medicine

## 2017-04-28 ENCOUNTER — Telehealth: Payer: Self-pay | Admitting: Family Medicine

## 2017-04-28 VITALS — BP 122/73 | HR 94 | Temp 98.3°F | Resp 18 | Wt 271.8 lb

## 2017-04-28 DIAGNOSIS — H10023 Other mucopurulent conjunctivitis, bilateral: Secondary | ICD-10-CM

## 2017-04-28 DIAGNOSIS — L309 Dermatitis, unspecified: Secondary | ICD-10-CM

## 2017-04-28 MED ORDER — GENTAMICIN SULFATE 0.3 % OP SOLN: 2.0000 [drp] | Freq: Four times a day (QID) | OPHTHALMIC | 0 refills | Status: DC

## 2017-04-28 NOTE — Telephone Encounter (Signed)
errenous °

## 2017-04-28 NOTE — Telephone Encounter (Signed)
Denied on August 22  Request Reference Number: [id number]. ELIDEL CRE 1% is denied for not meeting the prior authorization requirement(s).   For further questions, call [phone]. Appeals are not supported through [place]. Please refer to the fax case notice for appeals information and instructions.

## 2017-04-28 NOTE — Progress Notes (Signed)
Name: Nicole Ballard   MRN: [id number]    DOB: December 05, 1955   Date:04/28/2017       Progress Note  Subjective  Chief Complaint  Chief Complaint  Patient presents with  . Eye Drainage    Onset-Since Last weekend. Rheumatologist wanted Dr. Ancil Boozer to check for Pink eye since patient is having crusty eyes when she wakes up, red eyes and itchy. States Insurance covered cream and will pick it up today.    HPI  Eye irritation: she wakes up with eyes matted shut, and has periods of blurred vision during the day, when there is mucus in her eyes, conjunctiva is red and sometimes feels like sand is inside her eyes. She states it is very pruriginous and she scratches it constantly, allergy drops is not helping. We gave her Elidel for eye lid irritation and face desquamation but it was denied by insurance.Symptoms started 5 days ago, initially face pilling followed by eye irritation    Patient Active Problem List   Diagnosis Date Noted  . Vertigo 09/29/2016  . Bronchiolitis obliterans organizing pneumonia ([place]) 09/29/2016  . Neck pain, musculoskeletal 12/15/2015  . Anxiety about health 09/29/2015  . Abnormal CAT scan 04/11/2015  . Auditory impairment 04/11/2015  . Insomnia, persistent 04/11/2015  . Chronic kidney disease (CKD), stage I 04/11/2015  . Chronic nonmalignant pain 04/11/2015  . Chronic constipation 04/11/2015  . Dyslipidemia 04/11/2015  . Fibromyalgia 04/11/2015  . Gastro-esophageal reflux disease without esophagitis 04/11/2015  . Dysphonia 04/11/2015  . Absence of bladder continence 04/11/2015  . LBP (low back pain) 04/11/2015  . Eczema intertrigo 04/11/2015  . Keratosis pilaris 04/11/2015  . Chronic recurrent major depressive disorder ([place]) 04/11/2015  . Dysmetabolic syndrome 16/60/6301  . Neurogenic claudication 04/11/2015  . Perennial allergic rhinitis with seasonal variation 04/11/2015  . Abnormal presence of protein in urine 04/11/2015  . Seborrhea capitis 04/11/2015  .  Moderate dysplasia of vulva 04/11/2015  . Vitamin D deficiency 04/11/2015  . Treadmill stress test negative for angina pectoris 03/05/2015  . Shortness of breath 05/20/2014  . Morbid obesity ([place]) 02/03/2014  . Asthma, chronic 12/31/2013  . H/O total knee replacement 12/31/2013  . Episcleritis 09/26/2013  . Vocal cord dysfunction 05/28/2013  . Anxiety, generalized 03/19/2013  . Back pain 12/18/2012  . Pulmonary nodules 11/26/2012  . Lung nodule, multiple 11/26/2012  . Arthritis, degenerative 11/01/2012  . H/O aspiration pneumonitis 10/22/2012  . Postinflammatory pulmonary fibrosis ([place]) 10/22/2012  . Mixed incontinence 05/04/2012  . Benign neoplasm of pituitary gland and craniopharyngeal duct ([place]) 12/29/2010  . Systemic lupus erythematosus ([place]) 11/05/2010  . Chronic nausea 07/01/2008  . Benign hypertension 01/25/2005    Past Surgical History:  Procedure Laterality Date  . BRAIN SURGERY    . CHOLECYSTECTOMY    . VAGINAL HYSTERECTOMY      Family History  Problem Relation Age of Onset  . Breast cancer Mother   . Cancer Mother 36       Breast  . Cancer Father 47       Stomach  . Breast cancer Maternal Aunt     Social History   Social History  . Marital status: Divorced    Spouse name: N/A  . Number of children: N/A  . Years of education: N/A   Occupational History  . Not on file.   Social History Main Topics  . Smoking status: Never Smoker  . Smokeless tobacco: Never Used  . Alcohol use No  . Drug use: No  .  Sexual activity: Not Currently   Other Topics Concern  . Not on file   Social History Narrative  . No narrative on file     Current Outpatient Prescriptions:  .  albuterol (PROAIR HFA) 108 (90 BASE) MCG/ACT inhaler, Inhale 2 puffs into the lungs every 6 (six) hours as needed for wheezing. DX code R06.02, Disp: 3 Inhaler, Rfl: 3 .  azelastine (OPTIVAR) 0.05 % ophthalmic solution, Place 1 drop into both eyes 2 (two) times daily., Disp: 6 mL, Rfl:  1 .  buPROPion (WELLBUTRIN XL) 300 MG 24 hr tablet, Take 1 tablet by mouth daily., Disp: , Rfl:  .  Cholecalciferol (VITAMIN D) 2000 UNITS CAPS, Take 1 capsule by mouth daily., Disp: , Rfl:  .  clonazePAM (KLONOPIN) 0.5 MG tablet, Take 0.5 tablets (0.25 mg total) by mouth daily., Disp: 15 tablet, Rfl: 0 .  cyclobenzaprine (FLEXERIL) 5 MG tablet, Take 1 tablet (5 mg total) by mouth every 8 (eight) hours as needed for muscle spasms. Do not drive for 8 hours, Disp: 90 tablet, Rfl: 1 .  diclofenac sodium (VOLTAREN) 1 % GEL, APP 2 GRAMS EXT AA QID, Disp: , Rfl: 6 .  docusate sodium (COLACE) 100 MG capsule, Take 2 capsules (200 mg total) by mouth 2 (two) times daily., Disp: 120 capsule, Rfl: 0 .  DULoxetine (CYMBALTA) 60 MG capsule, Take 120 mg by mouth at bedtime. , Disp: , Rfl:  .  FLOVENT HFA 110 MCG/ACT inhaler, INHALE 2 PUFFS PO BID, Disp: , Rfl: 0 .  fluticasone (FLONASE) 50 MCG/ACT nasal spray, USE 2 SPRAYS IN EACH  NOSTRIL DAILY, Disp: 48 g, Rfl: 1 .  gabapentin (NEURONTIN) 300 MG capsule, Take 1 capsule by mouth 3 (three) times daily., Disp: , Rfl:  .  Glycerin, Laxative, (RA GLYCERIN ADULT) 80.7 % SUPP, Place rectally., Disp: , Rfl:  .  hydroxychloroquine (PLAQUENIL) 200 MG tablet, Take 200 mg by mouth 2 (two) times daily., Disp: , Rfl:  .  lamoTRIgine (LAMICTAL) 100 MG tablet, Take 1 tablet by mouth 3 (three) times daily., Disp: , Rfl:  .  losartan-hydrochlorothiazide (HYZAAR) 50-12.5 MG tablet, Take 0.5 tablets by mouth daily., Disp: 45 tablet, Rfl: 0 .  meclizine (ANTIVERT) 25 MG tablet, Take 1 tablet (25 mg total) by mouth 3 (three) times daily as needed., Disp: 30 tablet, Rfl: 0 .  Multiple Vitamin (MULTIVITAMIN) tablet, Take 1 tablet by mouth daily., Disp: , Rfl:  .  omeprazole (PRILOSEC) 40 MG capsule, Take 1 capsule (40 mg total) by mouth daily., Disp: 90 capsule, Rfl: 1 .  ondansetron (ZOFRAN) 4 MG tablet, Take 1 tablet (4 mg total) by mouth every 8 (eight) hours as needed for nausea  or vomiting., Disp: 60 tablet, Rfl: 0 .  polyethylene glycol powder (GLYCOLAX/MIRALAX) powder, 2 cap fulls in a full glass of water, three times a day, for 5 days., Disp: 255 g, Rfl: 0 .  topiramate (TOPAMAX) 50 MG tablet, Take 1 tablet by mouth 3 (three) times daily. Dr. [place] James - Pain management, Disp: , Rfl:  .  traZODone (DESYREL) 100 MG tablet, Take 1 tablet by mouth at bedtime., Disp: , Rfl:  .  triamcinolone ointment (KENALOG) 0.1 %, , Disp: , Rfl:  .  Vitamin D, Ergocalciferol, (DRISDOL) 50000 units CAPS capsule, TAKE 1 CAPSULE BY MOUTH EVERY 7 DAYS, Disp: 12 capsule, Rfl: 0 .  gentamicin (GARAMYCIN) 0.3 % ophthalmic solution, Place 2 drops into both eyes 4 (four) times daily., Disp: 5 mL, Rfl: 0  Allergies  Allergen Reactions  . Ace Inhibitors     Other reaction(s): OTHER Pt states she can not take ace inhibitors. Pt states she can not remember her reaction.   Marland Kitchen Penicillins      ROS  Ten systems reviewed and is negative except as mentioned in HPI   Objective  Vitals:   04/28/17 1321  BP: 122/73  Pulse: 94  Resp: 18  Temp: 98.3 F (36.8 C)  TempSrc: Oral  SpO2: 94%  Weight: 271 lb 12.8 oz (123.3 kg)    Body mass index is 41.33 kg/m.  Physical Exam  Constitutional: Patient appears well-developed and well-nourished. Obese  No distress.  HEENT: head atraumatic, normocephalic, pupils equal and reactive to light, conjunctiva is injected neck supple, throat within normal limits Cardiovascular: Normal rate, regular rhythm and normal heart sounds.  No murmur heard. No BLE edema. Pulmonary/Chest: Effort normal and breath sounds normal. No respiratory distress. Abdominal: Soft.  There is no tenderness. Psychiatric: Patient has a normal mood and affect. behavior is normal. Judgment and thought content normal.  Recent Results (from the past 2160 hour(s))  Lipid panel     Status: None   Collection Time: 02/14/17  9:58 AM  Result Value Ref Range   Cholesterol 165  <200 mg/dL   Triglycerides 51 <150 mg/dL   HDL 73 >50 mg/dL   Total CHOL/HDL Ratio 2.3 <5.0 Ratio   VLDL 10 <30 mg/dL   LDL Cholesterol 82 <100 mg/dL  COMPLETE METABOLIC PANEL WITH GFR     Status: Abnormal   Collection Time: 02/14/17  9:58 AM  Result Value Ref Range   Sodium 137 135 - 146 mmol/L   Potassium 4.8 3.5 - 5.3 mmol/L   Chloride 102 98 - 110 mmol/L   CO2 32 (H) 20 - 31 mmol/L   Glucose, Bld 67 65 - 99 mg/dL   BUN 14 7 - 25 mg/dL   Creat 0.95 0.50 - 1.05 mg/dL    Comment:   For patients > or = 59 years of age: The upper reference limit for Creatinine is approximately 13% higher for people identified as African-American.      Total Bilirubin 0.2 0.2 - 1.2 mg/dL   Alkaline Phosphatase 65 33 - 130 U/L   AST 14 10 - 35 U/L   ALT 13 6 - 29 U/L   Total Protein 7.7 6.1 - 8.1 g/dL   Albumin 3.5 (L) 3.6 - 5.1 g/dL   Calcium 9.1 8.6 - 10.4 mg/dL   GFR, Est African American 76 >=60 mL/min   GFR, Est Non African American 66 >=60 mL/min  Hemoglobin A1c     Status: Abnormal   Collection Time: 02/14/17  9:58 AM  Result Value Ref Range   Hgb A1c MFr Bld 6.1 (H) <5.7 %    Comment:   For someone without known diabetes, a hemoglobin A1c value between 5.7% and 6.4% is consistent with prediabetes and should be confirmed with a follow-up test.   For someone with known diabetes, a value <7% indicates that their diabetes is well controlled. A1c targets should be individualized based on duration of diabetes, age, co-morbid conditions and other considerations.   This assay result is consistent with an increased risk of diabetes.   Currently, no consensus exists regarding use of hemoglobin A1c for diagnosis of diabetes in children.      Mean Plasma Glucose 128 mg/dL  Insulin, fasting     Status: Abnormal   Collection Time: 02/14/17  9:58  AM  Result Value Ref Range   Insulin fasting, serum 21.5 (H) 2.0 - 19.6 uIU/mL    Comment:   This insulin assay shows strong cross-reactivity  for some insulin analogs (lispro, aspart, and glargine) and much lower cross-reactivity with others (detemir, glulisine).   Stimulated Insulin reference intervals were established using the Siemens Immulite assay. These values are provided for general guidance only.       PHQ2/9: Depression screen [facility] 2/9 04/28/2017 10/31/2016 07/12/2016 04/29/2016 01/28/2016  Decreased Interest 0 0 0 0 0  Down, Depressed, Hopeless 1 0 0 1 0  PHQ - 2 Score 1 0 0 1 0    Fall Risk: Fall Risk  04/28/2017 10/31/2016 07/12/2016 04/29/2016 01/28/2016  Falls in the past year? No Yes No No No  Number falls in past yr: - 1 - - -  Injury with Fall? - No - - -    Functional Status Survey: Is the patient deaf or have difficulty hearing?: No Does the patient have difficulty seeing, even when wearing glasses/contacts?: No Does the patient have difficulty concentrating, remembering, or making decisions?: No Does the patient have difficulty walking or climbing stairs?: No Does the patient have difficulty dressing or bathing?: No Does the patient have difficulty doing errands alone such as visiting a doctor's office or shopping?: No    Assessment & Plan  1. Other mucopurulent conjunctivitis of both eyes  Explained that it may be secondary to new medication, bacterial infection, viral, allergic, and she also has lupus and was advised to follow up with ophthalmologist if no resolution in 3 days.  - gentamicin (GARAMYCIN) 0.3 % ophthalmic solution; Place 2 drops into both eyes 4 (four) times daily.  Dispense: 5 mL; Refill: 0

## 2017-05-01 ENCOUNTER — Other Ambulatory Visit: Payer: Self-pay | Admitting: Family Medicine

## 2017-05-01 ENCOUNTER — Ambulatory Visit
Admission: RE | Admit: 2017-05-01 | Discharge: 2017-05-01 | Disposition: A | Discharge status: Home / Self Care | Payer: MEDICARE | Attending: Critical Care Medicine | Admitting: Critical Care Medicine

## 2017-05-01 DIAGNOSIS — J8489 Other specified interstitial pulmonary diseases: Secondary | ICD-10-CM | POA: Diagnosis not present

## 2017-05-01 MED ORDER — DESONIDE 0.05 % EX CREA: TOPICAL_CREAM | Freq: Two times a day (BID) | CUTANEOUS | 0 refills | Status: DC

## 2017-05-01 NOTE — Telephone Encounter (Signed)
Changed to Desonide, avoid near eyes

## 2017-05-03 ENCOUNTER — Ambulatory Visit
Admission: RE | Admit: 2017-05-03 | Discharge: 2017-05-03 | Disposition: A | Discharge status: Home / Self Care | Payer: MEDICARE | Attending: Ambulatory Care | Admitting: Ambulatory Care

## 2017-05-03 ENCOUNTER — Ambulatory Visit
Admission: RE | Admit: 2017-05-03 | Discharge: 2017-05-03 | Disposition: A | Discharge status: Home / Self Care | Payer: MEDICARE

## 2017-05-03 DIAGNOSIS — M329 Systemic lupus erythematosus, unspecified: Secondary | ICD-10-CM | POA: Diagnosis not present

## 2017-05-03 DIAGNOSIS — Z79899 Other long term (current) drug therapy: Secondary | ICD-10-CM | POA: Diagnosis not present

## 2017-05-03 DIAGNOSIS — H04123 Dry eye syndrome of bilateral lacrimal glands: Secondary | ICD-10-CM | POA: Diagnosis not present

## 2017-05-03 DIAGNOSIS — H5210 Myopia, unspecified eye: Secondary | ICD-10-CM | POA: Diagnosis not present

## 2017-05-03 DIAGNOSIS — H1013 Acute atopic conjunctivitis, bilateral: Secondary | ICD-10-CM | POA: Diagnosis not present

## 2017-05-03 MED ORDER — PREDNISOLONE ACETATE 1 % EYE DROPS,SUSPENSION
Freq: Four times a day (QID) | OPHTHALMIC | 0 refills | 0 days | Status: CP
Start: 2017-05-03 — End: 2017-12-14

## 2017-05-03 MED ORDER — HYDROXYCHLOROQUINE 200 MG TABLET
ORAL_TABLET | Freq: Two times a day (BID) | ORAL | 2 refills | 0 days | Status: CP
Start: 2017-05-03 — End: 2017-09-28

## 2017-05-09 ENCOUNTER — Telehealth: Payer: Self-pay

## 2017-05-09 NOTE — Telephone Encounter (Signed)
Please tell her to use otc hydrocortisone 0.1 %, medication too strong for her face

## 2017-05-09 NOTE — Telephone Encounter (Signed)
Got a fax from [facility] stating that the Desonide 0.05% cream is not covered by this patient's insurance. They have recommended Triamcinolone or  Mometasone.  Please send in new Rx.

## 2017-05-10 NOTE — Telephone Encounter (Signed)
Patient notified

## 2017-05-15 ENCOUNTER — Telehealth: Payer: Self-pay | Admitting: Family Medicine

## 2017-05-15 NOTE — Telephone Encounter (Signed)
Pt states that her eye doctor in [place]) suggested that her pcp check her thyroid and dm function to see why her eyes keep hurting

## 2017-05-15 NOTE — Telephone Encounter (Signed)
Please have pt schedule appointment

## 2017-05-15 NOTE — Telephone Encounter (Signed)
Pt informed and made appointment for Wednesday

## 2017-05-17 ENCOUNTER — Encounter: Payer: Self-pay | Admitting: Family Medicine

## 2017-05-17 ENCOUNTER — Other Ambulatory Visit: Payer: Self-pay | Admitting: Family Medicine

## 2017-05-17 ENCOUNTER — Ambulatory Visit (INDEPENDENT_AMBULATORY_CARE_PROVIDER_SITE_OTHER): Payer: Medicare Other | Admitting: Family Medicine

## 2017-05-17 VITALS — BP 124/68 | HR 99 | Temp 98.2°F | Resp 16 | Ht 68.0 in | Wt 267.6 lb

## 2017-05-17 DIAGNOSIS — E8881 Metabolic syndrome: Secondary | ICD-10-CM | POA: Diagnosis not present

## 2017-05-17 DIAGNOSIS — H578 Other specified disorders of eye and adnexa: Secondary | ICD-10-CM | POA: Diagnosis not present

## 2017-05-17 DIAGNOSIS — Z23 Encounter for immunization: Secondary | ICD-10-CM

## 2017-05-17 DIAGNOSIS — I10 Essential (primary) hypertension: Secondary | ICD-10-CM | POA: Diagnosis not present

## 2017-05-17 DIAGNOSIS — H5789 Other specified disorders of eye and adnexa: Secondary | ICD-10-CM

## 2017-05-17 NOTE — Progress Notes (Signed)
Name: Nicole Ballard   MRN: [id number]    DOB: 01-12-1956   Date:05/17/2017       Progress Note  Subjective  Chief Complaint  Chief Complaint  Patient presents with  . blood work    pt stated that her eye doctor suggested that she be checked for DM and that her thyroid function be tested due to her having recent eye pain    . Flu Vaccine    HPI  Eye irritation: she was seen by ophthalmologist and asked to have TSH done, her last hgbA1C was done 3 months ago and showed pre-diabetes at 6.1%. She is trying to drink more water and lost 5 lbs since last visit. She states prednisone drops helped with symptoms but is getting irritated again, still itchy around eye, but rash on face has resolved.    Patient Active Problem List   Diagnosis Date Noted  . Vertigo 09/29/2016  . Bronchiolitis obliterans organizing pneumonia ([place]) 09/29/2016  . Neck pain, musculoskeletal 12/15/2015  . Anxiety about health 09/29/2015  . Abnormal CAT scan 04/11/2015  . Auditory impairment 04/11/2015  . Insomnia, persistent 04/11/2015  . Chronic kidney disease (CKD), stage I 04/11/2015  . Chronic nonmalignant pain 04/11/2015  . Chronic constipation 04/11/2015  . Dyslipidemia 04/11/2015  . Fibromyalgia 04/11/2015  . Gastro-esophageal reflux disease without esophagitis 04/11/2015  . Dysphonia 04/11/2015  . Absence of bladder continence 04/11/2015  . LBP (low back pain) 04/11/2015  . Eczema intertrigo 04/11/2015  . Keratosis pilaris 04/11/2015  . Chronic recurrent major depressive disorder ([place]) 04/11/2015  . Dysmetabolic syndrome 99/37/1696  . Neurogenic claudication 04/11/2015  . Perennial allergic rhinitis with seasonal variation 04/11/2015  . Abnormal presence of protein in urine 04/11/2015  . Seborrhea capitis 04/11/2015  . Moderate dysplasia of vulva 04/11/2015  . Vitamin D deficiency 04/11/2015  . Treadmill stress test negative for angina pectoris 03/05/2015  . Shortness of breath 05/20/2014  .  Morbid obesity ([place]) 02/03/2014  . Asthma, chronic 12/31/2013  . H/O total knee replacement 12/31/2013  . Episcleritis 09/26/2013  . Vocal cord dysfunction 05/28/2013  . Anxiety, generalized 03/19/2013  . Back pain 12/18/2012  . Pulmonary nodules 11/26/2012  . Lung nodule, multiple 11/26/2012  . Arthritis, degenerative 11/01/2012  . H/O aspiration pneumonitis 10/22/2012  . Postinflammatory pulmonary fibrosis ([place]) 10/22/2012  . Mixed incontinence 05/04/2012  . Benign neoplasm of pituitary gland and craniopharyngeal duct ([place]) 12/29/2010  . Systemic lupus erythematosus ([place]) 11/05/2010  . Chronic nausea 07/01/2008  . Benign hypertension 01/25/2005    Past Surgical History:  Procedure Laterality Date  . BRAIN SURGERY    . CHOLECYSTECTOMY    . VAGINAL HYSTERECTOMY      Family History  Problem Relation Age of Onset  . Breast cancer Mother   . Cancer Mother 49       Breast  . Cancer Father 25       Stomach  . Breast cancer Maternal Aunt     Social History   Social History  . Marital status: Divorced    Spouse name: N/A  . Number of children: N/A  . Years of education: N/A   Occupational History  . Not on file.   Social History Main Topics  . Smoking status: Never Smoker  . Smokeless tobacco: Never Used  . Alcohol use No  . Drug use: No  . Sexual activity: Not Currently   Other Topics Concern  . Not on file   Social History Narrative  . No  narrative on file     Current Outpatient Prescriptions:  .  albuterol (PROAIR HFA) 108 (90 BASE) MCG/ACT inhaler, Inhale 2 puffs into the lungs every 6 (six) hours as needed for wheezing. DX code R06.02, Disp: 3 Inhaler, Rfl: 3 .  azelastine (OPTIVAR) 0.05 % ophthalmic solution, Place 1 drop into both eyes 2 (two) times daily., Disp: 6 mL, Rfl: 1 .  buPROPion (WELLBUTRIN XL) 300 MG 24 hr tablet, Take 1 tablet by mouth daily., Disp: , Rfl:  .  Cholecalciferol (VITAMIN D) 2000 UNITS CAPS, Take 1 capsule by mouth daily.,  Disp: , Rfl:  .  clonazePAM (KLONOPIN) 0.5 MG tablet, Take 0.5 tablets (0.25 mg total) by mouth daily., Disp: 15 tablet, Rfl: 0 .  cyclobenzaprine (FLEXERIL) 5 MG tablet, Take 1 tablet (5 mg total) by mouth every 8 (eight) hours as needed for muscle spasms. Do not drive for 8 hours, Disp: 90 tablet, Rfl: 1 .  diclofenac sodium (VOLTAREN) 1 % GEL, APP 2 GRAMS EXT AA QID, Disp: , Rfl: 6 .  docusate sodium (COLACE) 100 MG capsule, Take 2 capsules (200 mg total) by mouth 2 (two) times daily., Disp: 120 capsule, Rfl: 0 .  DULoxetine (CYMBALTA) 60 MG capsule, Take 120 mg by mouth at bedtime. , Disp: , Rfl:  .  FLOVENT HFA 110 MCG/ACT inhaler, INHALE 2 PUFFS PO BID, Disp: , Rfl: 0 .  fluticasone (FLONASE) 50 MCG/ACT nasal spray, USE 2 SPRAYS IN EACH  NOSTRIL DAILY, Disp: 48 g, Rfl: 1 .  gabapentin (NEURONTIN) 300 MG capsule, Take 1 capsule by mouth 3 (three) times daily., Disp: , Rfl:  .  gentamicin (GARAMYCIN) 0.3 % ophthalmic solution, Place 2 drops into both eyes 4 (four) times daily., Disp: 5 mL, Rfl: 0 .  Glycerin, Laxative, (RA GLYCERIN ADULT) 80.7 % SUPP, Place rectally., Disp: , Rfl:  .  hydroxychloroquine (PLAQUENIL) 200 MG tablet, Take 200 mg by mouth 2 (two) times daily., Disp: , Rfl:  .  lamoTRIgine (LAMICTAL) 100 MG tablet, Take 1 tablet by mouth 3 (three) times daily., Disp: , Rfl:  .  losartan-hydrochlorothiazide (HYZAAR) 50-12.5 MG tablet, Take 0.5 tablets by mouth daily., Disp: 45 tablet, Rfl: 0 .  meclizine (ANTIVERT) 25 MG tablet, Take 1 tablet (25 mg total) by mouth 3 (three) times daily as needed., Disp: 30 tablet, Rfl: 0 .  Multiple Vitamin (MULTIVITAMIN) tablet, Take 1 tablet by mouth daily., Disp: , Rfl:  .  omeprazole (PRILOSEC) 40 MG capsule, Take 1 capsule (40 mg total) by mouth daily., Disp: 90 capsule, Rfl: 1 .  ondansetron (ZOFRAN) 4 MG tablet, Take 1 tablet (4 mg total) by mouth every 8 (eight) hours as needed for nausea or vomiting., Disp: 60 tablet, Rfl: 0 .  polyethylene  glycol powder (GLYCOLAX/MIRALAX) powder, 2 cap fulls in a full glass of water, three times a day, for 5 days., Disp: 255 g, Rfl: 0 .  topiramate (TOPAMAX) 50 MG tablet, Take 1 tablet by mouth 3 (three) times daily. Dr. [place] James - Pain management, Disp: , Rfl:  .  traZODone (DESYREL) 100 MG tablet, Take 1 tablet by mouth at bedtime., Disp: , Rfl:  .  triamcinolone ointment (KENALOG) 0.1 %, , Disp: , Rfl:  .  Vitamin D, Ergocalciferol, (DRISDOL) 50000 units CAPS capsule, TAKE 1 CAPSULE BY MOUTH EVERY 7 DAYS, Disp: 12 capsule, Rfl: 0  Allergies  Allergen Reactions  . Ace Inhibitors     Other reaction(s): OTHER Pt states she can not take  ace inhibitors. Pt states she can not remember her reaction.   Marland Kitchen Penicillins      ROS  Constitutional: Negative for fever or weight change.  Respiratory: Negative for cough and shortness of breath.   Cardiovascular: Negative for chest pain or palpitations.  ( but she has muscle spasms)  Gastrointestinal: Negative for abdominal pain, no bowel changes.  Musculoskeletal: Positive  for gait problem and right joint swelling.  Skin: Negative for rash.  Neurological: Positive for dizziness and  Headache.- chronic   No other specific complaints in a complete review of systems (except as listed in HPI above). Objective  Vitals:   05/17/17 1427  BP: 124/68  Pulse: 99  Resp: 16  Temp: 98.2 F (36.8 C)  SpO2: 98%  Weight: 267 lb 9 oz (121.4 kg)  Height: 5\' 8"  (1.727 m)    Body mass index is 40.68 kg/m.  Physical Exam  Constitutional: Patient appears well-developed and well-nourished. Obese  No distress.  HEENT: head atraumatic, normocephalic, pupils equal and reactive to light, eye still irritated, conjunctiva injected neck supple, throat within normal limits Cardiovascular: Normal rate, regular rhythm and normal heart sounds.  No murmur heard. No BLE edema. Pulmonary/Chest: Effort normal and breath sounds normal. No respiratory  distress. Abdominal: Soft.  There is no tenderness. Psychiatric: Patient has a normal mood and affect. behavior is normal. Judgment and thought content normal.  PHQ2/9: Depression screen [facility] 2/9 04/28/2017 10/31/2016 07/12/2016 04/29/2016 01/28/2016  Decreased Interest 0 0 0 0 0  Down, Depressed, Hopeless 1 0 0 1 0  PHQ - 2 Score 1 0 0 1 0     Fall Risk: Fall Risk  04/28/2017 10/31/2016 07/12/2016 04/29/2016 01/28/2016  Falls in the past year? No Yes No No No  Number falls in past yr: - 1 - - -  Injury with Fall? - No - - -    Assessment & Plan  1. Irritation of both eyes  - TSH Asked by ophthalmologist   2. Need for influenza vaccination  - Flu Vaccine QUAD 6+ mos PF IM (Fluarix Quad PF)  3. Morbid obesity (H[facility])  - TSH  4. Insulin resistance  - Amb ref to Medical Nutrition Therapy-MNT

## 2017-05-18 LAB — TSH: TSH: 0.73 mIU/L (ref 0.40–4.50)

## 2017-05-18 MED ORDER — ALBUTEROL SULFATE HFA 90 MCG/ACTUATION AEROSOL INHALER
Freq: Four times a day (QID) | RESPIRATORY_TRACT | 3 refills | 0 days | Status: CP | PRN
Start: 2017-05-18 — End: 2018-02-26

## 2017-05-18 MED ORDER — FLUTICASONE PROPIONATE 110 MCG/ACTUATION HFA AEROSOL INHALER
Freq: Two times a day (BID) | RESPIRATORY_TRACT | 3 refills | 0 days | Status: CP
Start: 2017-05-18 — End: 2018-06-15

## 2017-05-24 ENCOUNTER — Ambulatory Visit: Admission: RE | Admit: 2017-05-24 | Discharge: 2017-05-24 | Payer: MEDICARE

## 2017-05-24 DIAGNOSIS — H01119 Allergic dermatitis of unspecified eye, unspecified eyelid: Secondary | ICD-10-CM | POA: Diagnosis not present

## 2017-05-24 DIAGNOSIS — Z79899 Other long term (current) drug therapy: Secondary | ICD-10-CM | POA: Diagnosis not present

## 2017-05-24 DIAGNOSIS — H1089 Other conjunctivitis: Secondary | ICD-10-CM | POA: Diagnosis not present

## 2017-05-24 DIAGNOSIS — H578 Other specified disorders of eye and adnexa: Secondary | ICD-10-CM | POA: Diagnosis not present

## 2017-05-24 DIAGNOSIS — H16223 Keratoconjunctivitis sicca, not specified as Sjogren's, bilateral: Secondary | ICD-10-CM | POA: Diagnosis not present

## 2017-05-24 MED ORDER — PREDNISOLONE ACETATE 1 % EYE DROPS,SUSPENSION
Freq: Every day | OPHTHALMIC | 3 refills | 0.00000 days | Status: CP | PRN
Start: 2017-05-24 — End: 2017-06-24

## 2017-05-25 NOTE — Telephone Encounter (Signed)
Document just forwarded to me; was involved because I was covering in primary's absence Will forward not to primary

## 2017-05-26 ENCOUNTER — Other Ambulatory Visit: Payer: Self-pay | Admitting: Family Medicine

## 2017-05-26 DIAGNOSIS — M3213 Lung involvement in systemic lupus erythematosus: Secondary | ICD-10-CM

## 2017-05-26 NOTE — Progress Notes (Unsigned)
[facility] wants patient to have a CBC and CMP drawn every 2 weeks for total of three drawings. To have lab work done and sent to their office due to new medication. Please fax results to [phone]. Dr. Darlin Drop signed this order due to Other systemic lupus erythematosus with lung involvement. (M32.13)

## 2017-05-29 DIAGNOSIS — M3213 Lung involvement in systemic lupus erythematosus: Secondary | ICD-10-CM | POA: Diagnosis not present

## 2017-05-29 LAB — COMPLETE METABOLIC PANEL WITH GFR
AG Ratio: 1 (calc) (ref 1.0–2.5)
ALBUMIN MSPROF: 3.6 g/dL (ref 3.6–5.1)
ALT: 15 U/L (ref 6–29)
AST: 18 U/L (ref 10–35)
Alkaline phosphatase (APISO): 64 U/L (ref 33–130)
BILIRUBIN TOTAL: 0.3 mg/dL (ref 0.2–1.2)
BUN: 10 mg/dL (ref 7–25)
CALCIUM: 9.1 mg/dL (ref 8.6–10.4)
CHLORIDE: 103 mmol/L (ref 98–110)
CO2: 31 mmol/L (ref 20–32)
CREATININE: 0.9 mg/dL (ref 0.50–1.05)
GFR, EST AFRICAN AMERICAN: 81 mL/min/{1.73_m2} (ref >=60)
GFR, Est Non African American: 70 mL/min/{1.73_m2} (ref >=60)
GLUCOSE: 84 mg/dL (ref 65–99)
Globulin: 3.6 g/dL (calc) (ref 1.9–3.7)
Potassium: 4.1 mmol/L (ref 3.5–5.3)
Sodium: 140 mmol/L (ref 135–146)
TOTAL PROTEIN: 7.2 g/dL (ref 6.1–8.1)

## 2017-05-29 LAB — CBC WITH DIFFERENTIAL/PLATELET
BASOS PCT: 0.7 %
Basophils Absolute: 56 cells/uL (ref 0–200)
EOS PCT: 0.7 %
Eosinophils Absolute: 56 cells/uL (ref 15–500)
HCT: 39.9 % (ref 35.0–45.0)
HEMOGLOBIN: 13.4 g/dL (ref 11.7–15.5)
Lymphs Abs: 2496 cells/uL (ref 850–3900)
MCH: 26.9 pg — AB (ref 27.0–33.0)
MCHC: 33.6 g/dL (ref 32.0–36.0)
MCV: 80.1 fL (ref 80.0–100.0)
MONOS PCT: 9.5 %
MPV: 9.7 fL (ref 7.5–12.5)
NEUTROS ABS: 4632 {cells}/uL (ref 1500–7800)
Neutrophils Relative %: 57.9 %
PLATELETS: 384 10*3/uL (ref 140–400)
RBC: 4.98 10*6/uL (ref 3.80–5.10)
RDW: 14.6 % (ref 11.0–15.0)
TOTAL LYMPHOCYTE: 31.2 %
WBC mixed population: 760 cells/uL (ref 200–950)
WBC: 8 10*3/uL (ref 3.8–10.8)

## 2017-05-30 ENCOUNTER — Ambulatory Visit
Admission: RE | Admit: 2017-05-30 | Discharge: 2017-05-30 | Payer: Commercial Managed Care - PPO | Attending: Psychiatry | Admitting: Psychiatry

## 2017-05-30 DIAGNOSIS — F411 Generalized anxiety disorder: Secondary | ICD-10-CM

## 2017-05-30 DIAGNOSIS — F329 Major depressive disorder, single episode, unspecified: Principal | ICD-10-CM

## 2017-05-30 MED ORDER — TRAZODONE 100 MG TABLET
ORAL_TABLET | Freq: Every evening | ORAL | 1 refills | 0 days | Status: CP
Start: 2017-05-30 — End: 2017-08-01

## 2017-05-30 MED ORDER — BUPROPION HCL XL 450 MG 24 HR TABLET, EXTENDED RELEASE
ORAL_TABLET | Freq: Every morning | ORAL | 1 refills | 0.00000 days | Status: CP
Start: 2017-05-30 — End: 2017-08-01

## 2017-05-30 MED ORDER — CLONAZEPAM 0.5 MG TABLET
ORAL_TABLET | Freq: Every evening | ORAL | 2 refills | 0 days | Status: CP
Start: 2017-05-30 — End: 2017-08-01

## 2017-05-30 MED ORDER — LAMOTRIGINE 200 MG TABLET
ORAL_TABLET | Freq: Two times a day (BID) | ORAL | 1 refills | 0 days | Status: CP
Start: 2017-05-30 — End: 2017-08-01

## 2017-05-30 MED ORDER — DULOXETINE 60 MG CAPSULE,DELAYED RELEASE
ORAL_CAPSULE | Freq: Every day | ORAL | 1 refills | 0.00000 days | Status: CP
Start: 2017-05-30 — End: 2017-08-01

## 2017-06-01 ENCOUNTER — Ambulatory Visit
Admission: RE | Admit: 2017-06-01 | Discharge: 2017-06-01 | Disposition: A | Discharge status: Home / Self Care | Attending: Internal Medicine

## 2017-06-01 DIAGNOSIS — M3213 Lung involvement in systemic lupus erythematosus: Secondary | ICD-10-CM | POA: Diagnosis not present

## 2017-06-01 DIAGNOSIS — Z7951 Long term (current) use of inhaled steroids: Secondary | ICD-10-CM | POA: Diagnosis not present

## 2017-06-01 DIAGNOSIS — Z96659 Presence of unspecified artificial knee joint: Secondary | ICD-10-CM | POA: Diagnosis not present

## 2017-06-01 DIAGNOSIS — E1122 Type 2 diabetes mellitus with diabetic chronic kidney disease: Secondary | ICD-10-CM | POA: Diagnosis not present

## 2017-06-01 DIAGNOSIS — D892 Hypergammaglobulinemia, unspecified: Secondary | ICD-10-CM | POA: Diagnosis not present

## 2017-06-01 DIAGNOSIS — M1991 Primary osteoarthritis, unspecified site: Secondary | ICD-10-CM | POA: Diagnosis not present

## 2017-06-01 DIAGNOSIS — D6862 Lupus anticoagulant syndrome: Secondary | ICD-10-CM | POA: Diagnosis not present

## 2017-06-01 DIAGNOSIS — Z880 Allergy status to penicillin: Secondary | ICD-10-CM | POA: Diagnosis not present

## 2017-06-01 DIAGNOSIS — M797 Fibromyalgia: Secondary | ICD-10-CM | POA: Diagnosis not present

## 2017-06-01 DIAGNOSIS — I129 Hypertensive chronic kidney disease with stage 1 through stage 4 chronic kidney disease, or unspecified chronic kidney disease: Secondary | ICD-10-CM | POA: Diagnosis not present

## 2017-06-01 DIAGNOSIS — M329 Systemic lupus erythematosus, unspecified: Secondary | ICD-10-CM | POA: Diagnosis not present

## 2017-06-01 DIAGNOSIS — N181 Chronic kidney disease, stage 1: Secondary | ICD-10-CM | POA: Diagnosis not present

## 2017-06-01 DIAGNOSIS — Z79899 Other long term (current) drug therapy: Secondary | ICD-10-CM | POA: Diagnosis not present

## 2017-06-01 DIAGNOSIS — J45909 Unspecified asthma, uncomplicated: Secondary | ICD-10-CM | POA: Diagnosis not present

## 2017-06-08 MED ORDER — GABAPENTIN 300 MG CAPSULE
ORAL_CAPSULE | 0 refills | 0 days | Status: CP
Start: 2017-06-08 — End: 2017-10-30

## 2017-06-12 ENCOUNTER — Other Ambulatory Visit: Payer: Self-pay

## 2017-06-12 DIAGNOSIS — M3213 Lung involvement in systemic lupus erythematosus: Secondary | ICD-10-CM

## 2017-06-12 LAB — COMPLETE METABOLIC PANEL WITH GFR
AG Ratio: 0.9 (calc) — ABNORMAL LOW (ref 1.0–2.5)
ALBUMIN MSPROF: 3.4 g/dL — AB (ref 3.6–5.1)
ALT: 13 U/L (ref 6–29)
AST: 14 U/L (ref 10–35)
Alkaline phosphatase (APISO): 57 U/L (ref 33–130)
BILIRUBIN TOTAL: 0.3 mg/dL (ref 0.2–1.2)
BUN: 12 mg/dL (ref 7–25)
CALCIUM: 9.2 mg/dL (ref 8.6–10.4)
CHLORIDE: 101 mmol/L (ref 98–110)
CO2: 34 mmol/L — AB (ref 20–32)
Creat: 0.91 mg/dL (ref 0.50–1.05)
GFR, EST AFRICAN AMERICAN: 80 mL/min/{1.73_m2} (ref >=60)
GFR, Est Non African American: 69 mL/min/{1.73_m2} (ref >=60)
GLUCOSE: 67 mg/dL (ref 65–99)
Globulin: 3.8 g/dL (calc) — ABNORMAL HIGH (ref 1.9–3.7)
POTASSIUM: 4.5 mmol/L (ref 3.5–5.3)
SODIUM: 141 mmol/L (ref 135–146)
TOTAL PROTEIN: 7.2 g/dL (ref 6.1–8.1)

## 2017-06-12 LAB — CBC WITH DIFFERENTIAL/PLATELET
BASOS ABS: 88 {cells}/uL (ref 0–200)
Basophils Relative: 0.9 %
EOS ABS: 88 {cells}/uL (ref 15–500)
EOS PCT: 0.9 %
HCT: 39.9 % (ref 35.0–45.0)
HEMOGLOBIN: 12.9 g/dL (ref 11.7–15.5)
Lymphs Abs: 3175 cells/uL (ref 850–3900)
MCH: 26.4 pg — AB (ref 27.0–33.0)
MCHC: 32.3 g/dL (ref 32.0–36.0)
MCV: 81.6 fL (ref 80.0–100.0)
MONOS PCT: 10.1 %
MPV: 9.7 fL (ref 7.5–12.5)
NEUTROS ABS: 5459 {cells}/uL (ref 1500–7800)
Neutrophils Relative %: 55.7 %
PLATELETS: 387 10*3/uL (ref 140–400)
RBC: 4.89 10*6/uL (ref 3.80–5.10)
RDW: 14.3 % (ref 11.0–15.0)
TOTAL LYMPHOCYTE: 32.4 %
WBC mixed population: 990 cells/uL — ABNORMAL HIGH (ref 200–950)
WBC: 9.8 10*3/uL (ref 3.8–10.8)

## 2017-06-19 ENCOUNTER — Telehealth: Payer: Self-pay | Admitting: Family Medicine

## 2017-06-19 NOTE — Telephone Encounter (Signed)
Pt said that she needs her last labs results be faxed to rheumatology @ [phone]. And also to pulmonary Dr @ [phone]. Please fax to each one of these per the Dr office. Offices said that they did not receive them .

## 2017-06-19 NOTE — Telephone Encounter (Signed)
TRY FAXING TO PULMONARY WITH NUMBER GIVEN AND IT NEEDS TO BE [phone] NOT [phone]

## 2017-06-19 NOTE — Telephone Encounter (Signed)
Pt would like to get her lab results.

## 2017-06-19 NOTE — Telephone Encounter (Signed)
Printed recent lab so she is able to take it to her doctors at [facility]. Also refaxed the lab results to her Rheumatologist and Pulmonary Doctor.

## 2017-06-23 ENCOUNTER — Encounter: Payer: Self-pay | Admitting: Family Medicine

## 2017-06-23 ENCOUNTER — Ambulatory Visit (INDEPENDENT_AMBULATORY_CARE_PROVIDER_SITE_OTHER): Payer: Medicare Other | Admitting: Family Medicine

## 2017-06-23 VITALS — BP 124/70 | HR 84 | Temp 99.7°F | Resp 16 | Ht 68.0 in | Wt 270.2 lb

## 2017-06-23 DIAGNOSIS — G8929 Other chronic pain: Secondary | ICD-10-CM

## 2017-06-23 DIAGNOSIS — K5900 Constipation, unspecified: Secondary | ICD-10-CM

## 2017-06-23 DIAGNOSIS — M545 Low back pain, unspecified: Secondary | ICD-10-CM

## 2017-06-23 DIAGNOSIS — R110 Nausea: Secondary | ICD-10-CM | POA: Diagnosis not present

## 2017-06-23 DIAGNOSIS — R109 Unspecified abdominal pain: Secondary | ICD-10-CM

## 2017-06-23 MED ORDER — MYCOPHENOLATE MOFETIL 500 MG PO TABS: 1000.0000 mg | ORAL_TABLET | Freq: Two times a day (BID) | ORAL | 0 refills | Status: DC

## 2017-06-23 MED ORDER — DICYCLOMINE HCL 20 MG PO TABS: 20.0000 mg | ORAL_TABLET | Freq: Four times a day (QID) | ORAL | 0 refills | Status: DC | PRN

## 2017-06-23 NOTE — Patient Instructions (Addendum)
Miralax twice daily until bowels return to normal regularity, then once daily. Drink plenty of water, increase leafy greens/fiber intake. Take Bentyl as needed for stomach cramping. Take Ondansetron as needed for nausea.  If you develop increased abdominal pain, if your abdomen becomes "tight" or bloated, if you are not passing gas or able to have a bowel movement, if you develop severe nausea and/or vomiting, please present to the emergency department for further evaluation. Abdominal Pain, Adult Many things can cause belly (abdominal) pain. Most times, belly pain is not dangerous. Many cases of belly pain can be watched and treated at home. Sometimes belly pain is serious, though. Your doctor will try to find the cause of your belly pain. Follow these instructions at home:  Take over-the-counter and prescription medicines only as told by your doctor. Do not take medicines that help you poop (laxatives) unless told to by your doctor.  Drink enough fluid to keep your pee (urine) clear or pale yellow.  Watch your belly pain for any changes.  Keep all follow-up visits as told by your doctor. This is important. Contact a doctor if:  Your belly pain changes or gets worse.  You are not hungry, or you lose weight without trying.  You are having trouble pooping (constipated) or have watery poop (diarrhea) for more than 2-3 days.  You have pain when you pee or poop.  Your belly pain wakes you up at night.  Your pain gets worse with meals, after eating, or with certain foods.  You are throwing up and cannot keep anything down.  You have a fever. Get help right away if:  Your pain does not go away as soon as your doctor says it should.  You cannot stop throwing up.  Your pain is only in areas of your belly, such as the right side or the left lower part of the belly.  You have bloody or black poop, or poop that looks like tar.  You have very bad pain, cramping, or bloating in your  belly.  You have signs of not having enough fluid or water in your body (dehydration), such as: ? Dark pee, very little pee, or no pee. ? Cracked lips. ? Dry mouth. ? Sunken eyes. ? Sleepiness. ? Weakness. This information is not intended to replace advice given to you by your health care provider. Make sure you discuss any questions you have with your health care provider. Document Released: 02/08/2008 Document Revised: 03/11/2016 Document Reviewed: 02/03/2016 Elsevier Interactive Patient Education  2017 [employer].

## 2017-06-23 NOTE — Progress Notes (Signed)
Name: Nicole Ballard   MRN: [id number]    DOB: 07-06-1956   Date:06/23/2017       Progress Note  Subjective  Chief Complaint  Chief Complaint  Patient presents with  . Nausea  . Abdominal Pain    feeels like pain radiates to back    HPI  Patient presents with 4 day history of increased nausea, abdominal pain and mild cramping that radiates into her back.  She endorses constipation - did have BM today that was normal, but it had been several days since a BM before that.  BM did improve the pain slgihtly.  Last dose of miralax was over a week ago. She has been taking Zofran Q8H PRN for nausea with mild to moderate relief; still taking omeprazole once daily. Denies urinary symptoms; no blood in stool or black and tarry stools; no vomiting.  Has been able to pass gas today without issue.  She does see rheumatology for Lupus and is taking plaquinil. Has been eating a lot more protein lately, and this has slowed her bowels down considerably.  She has chronic nausea, low back pain, fibromyalgia, morbid obesity, and a history of constipation.   Patient Active Problem List   Diagnosis Date Noted  . Vertigo 09/29/2016  . Bronchiolitis obliterans organizing pneumonia ([place]) 09/29/2016  . Neck pain, musculoskeletal 12/15/2015  . Anxiety about health 09/29/2015  . Abnormal CAT scan 04/11/2015  . Auditory impairment 04/11/2015  . Insomnia, persistent 04/11/2015  . Chronic kidney disease (CKD), stage I 04/11/2015  . Chronic nonmalignant pain 04/11/2015  . Chronic constipation 04/11/2015  . Dyslipidemia 04/11/2015  . Fibromyalgia 04/11/2015  . Gastro-esophageal reflux disease without esophagitis 04/11/2015  . Dysphonia 04/11/2015  . Absence of bladder continence 04/11/2015  . LBP (low back pain) 04/11/2015  . Eczema intertrigo 04/11/2015  . Keratosis pilaris 04/11/2015  . Chronic recurrent major depressive disorder ([place]) 04/11/2015  . Dysmetabolic syndrome 79/89/2119  . Neurogenic  claudication 04/11/2015  . Perennial allergic rhinitis with seasonal variation 04/11/2015  . Abnormal presence of protein in urine 04/11/2015  . Seborrhea capitis 04/11/2015  . Moderate dysplasia of vulva 04/11/2015  . Vitamin D deficiency 04/11/2015  . Treadmill stress test negative for angina pectoris 03/05/2015  . Shortness of breath 05/20/2014  . Morbid obesity ([address]) 02/03/2014  . Asthma, chronic 12/31/2013  . H/O total knee replacement 12/31/2013  . Episcleritis 09/26/2013  . Vocal cord dysfunction 05/28/2013  . Anxiety, generalized 03/19/2013  . Back pain 12/18/2012  . Pulmonary nodules 11/26/2012  . Lung nodule, multiple 11/26/2012  . Arthritis, degenerative 11/01/2012  . H/O aspiration pneumonitis 10/22/2012  . Postinflammatory pulmonary fibrosis ([place]) 10/22/2012  . Mixed incontinence 05/04/2012  . Benign neoplasm of pituitary gland and craniopharyngeal duct ([place]) 12/29/2010  . Lung involvement in systemic lupus erythematosus ([place]) 11/05/2010  . Chronic nausea 07/01/2008  . Benign hypertension 01/25/2005    Social History  Substance Use Topics  . Smoking status: Never Smoker  . Smokeless tobacco: Never Used  . Alcohol use No     Current Outpatient Prescriptions:  .  albuterol (PROAIR HFA) 108 (90 BASE) MCG/ACT inhaler, Inhale 2 puffs into the lungs every 6 (six) hours as needed for wheezing. DX code R06.02, Disp: 3 Inhaler, Rfl: 3 .  azelastine (OPTIVAR) 0.05 % ophthalmic solution, Place 1 drop into both eyes 2 (two) times daily., Disp: 6 mL, Rfl: 1 .  buPROPion (WELLBUTRIN XL) 300 MG 24 hr tablet, Take 1 tablet by mouth daily., Disp: ,  Rfl:  .  Cholecalciferol (VITAMIN D) 2000 UNITS CAPS, Take 1 capsule by mouth daily., Disp: , Rfl:  .  clonazePAM (KLONOPIN) 0.5 MG tablet, Take 0.5 tablets (0.25 mg total) by mouth daily., Disp: 15 tablet, Rfl: 0 .  cyclobenzaprine (FLEXERIL) 5 MG tablet, Take 1 tablet (5 mg total) by mouth every 8 (eight) hours as needed for muscle  spasms. Do not drive for 8 hours, Disp: 90 tablet, Rfl: 1 .  diclofenac sodium (VOLTAREN) 1 % GEL, APP 2 GRAMS EXT AA QID, Disp: , Rfl: 6 .  DULoxetine (CYMBALTA) 60 MG capsule, Take 120 mg by mouth at bedtime. , Disp: , Rfl:  .  FLOVENT HFA 110 MCG/ACT inhaler, INHALE 2 PUFFS PO BID, Disp: , Rfl: 0 .  fluticasone (FLONASE) 50 MCG/ACT nasal spray, USE 2 SPRAYS IN EACH  NOSTRIL DAILY, Disp: 48 g, Rfl: 1 .  gabapentin (NEURONTIN) 300 MG capsule, Take 1 capsule by mouth 3 (three) times daily., Disp: , Rfl:  .  gentamicin (GARAMYCIN) 0.3 % ophthalmic solution, Place 2 drops into both eyes 4 (four) times daily., Disp: 5 mL, Rfl: 0 .  hydroxychloroquine (PLAQUENIL) 200 MG tablet, Take 200 mg by mouth 2 (two) times daily., Disp: , Rfl:  .  lamoTRIgine (LAMICTAL) 100 MG tablet, Take 1 tablet by mouth 3 (three) times daily., Disp: , Rfl:  .  losartan-hydrochlorothiazide (HYZAAR) 50-12.5 MG tablet, Take 0.5 tablets by mouth daily., Disp: 45 tablet, Rfl: 0 .  meclizine (ANTIVERT) 25 MG tablet, Take 1 tablet (25 mg total) by mouth 3 (three) times daily as needed., Disp: 30 tablet, Rfl: 0 .  Multiple Vitamin (MULTIVITAMIN) tablet, Take 1 tablet by mouth daily., Disp: , Rfl:  .  omeprazole (PRILOSEC) 40 MG capsule, Take 1 capsule (40 mg total) by mouth daily., Disp: 90 capsule, Rfl: 1 .  ondansetron (ZOFRAN) 4 MG tablet, Take 1 tablet (4 mg total) by mouth every 8 (eight) hours as needed for nausea or vomiting., Disp: 60 tablet, Rfl: 0 .  polyethylene glycol powder (GLYCOLAX/MIRALAX) powder, 2 cap fulls in a full glass of water, three times a day, for 5 days., Disp: 255 g, Rfl: 0 .  topiramate (TOPAMAX) 50 MG tablet, Take 1 tablet by mouth 3 (three) times daily. Dr. [place] James - Pain management, Disp: , Rfl:  .  traZODone (DESYREL) 100 MG tablet, Take 1 tablet by mouth at bedtime., Disp: , Rfl:  .  triamcinolone ointment (KENALOG) 0.1 %, , Disp: , Rfl:  .  Vitamin D, Ergocalciferol, (DRISDOL) 50000 units  CAPS capsule, TAKE 1 CAPSULE BY MOUTH EVERY 7 DAYS, Disp: 12 capsule, Rfl: 0 .  dicyclomine (BENTYL) 20 MG tablet, Take 1 tablet (20 mg total) by mouth every 6 (six) hours as needed (Abdominal Cramping/Pain)., Disp: 15 tablet, Rfl: 0 .  mycophenolate (CELLCEPT) 500 MG tablet, Take 2 tablets (1,000 mg total) by mouth 2 (two) times daily., Disp: 5 tablet, Rfl: 0  Allergies  Allergen Reactions  . Ace Inhibitors     Other reaction(s): OTHER Pt states she can not take ace inhibitors. Pt states she can not remember her reaction.   Marland Kitchen Penicillins     ROS  Constitutional: Negative for fever or weight change.  Respiratory: Negative for cough and shortness of breath.   Cardiovascular: Negative for chest pain or palpitations.  Gastrointestinal: See HPI Musculoskeletal: Negative for gait problem or joint swelling.  Skin: Negative for rash.  Neurological: Negative for dizziness or headache.  No other specific  complaints in a complete review of systems (except as listed in HPI above).  Objective  Vitals:   06/23/17 1423  BP: 124/70  Pulse: 84  Resp: 16  Temp: 99.7 F (37.6 C)  TempSrc: Oral  SpO2: 96%  Weight: 270 lb 3.2 oz (122.6 kg)  Height: 5\' 8"  (1.727 m)   Body mass index is 41.08 kg/m.  Nursing Note and Vital Signs reviewed.  Physical Exam  Constitutional: Patient appears well-developed and well-nourished. Obese. No distress.  HEENT: head atraumatic, normocephalic Cardiovascular: Normal rate, regular rhythm, S1/S2 present.  No murmur or rub heard. No BLE edema. Pulmonary/Chest: Effort normal and breath sounds clear. No respiratory distress or retractions. Abdominal: Soft, obese abdomen with no hernias; mild to moderate tenderness to central upper and LUQ on palpation - the pain radiates slightly to the back.  No HSM, no masses or pulsations in abdomen noted. Bowel sounds present x4 quadrants.  No CVA Tenderness. MSK: Low back is slightly tender on palpation along the  musculature in the lumbar spine, AROM of the spine is baseline. Psychiatric: Patient has a normal mood and affect. behavior is normal. Judgment and thought content normal.  Recent Results (from the past 2160 hour(s))  TSH     Status: None   Collection Time: 05/17/17  3:05 PM  Result Value Ref Range   TSH 0.73 0.40 - 4.50 mIU/L  CBC with Differential/Platelet     Status: Abnormal   Collection Time: 05/29/17 10:12 AM  Result Value Ref Range   WBC 8.0 3.8 - 10.8 Thousand/uL   RBC 4.98 3.80 - 5.10 Million/uL   Hemoglobin 13.4 11.7 - 15.5 g/dL   HCT 39.9 35.0 - 45.0 %   MCV 80.1 80.0 - 100.0 fL   MCH 26.9 (L) 27.0 - 33.0 pg   MCHC 33.6 32.0 - 36.0 g/dL   RDW 14.6 11.0 - 15.0 %   Platelets 384 140 - 400 Thousand/uL   MPV 9.7 7.5 - 12.5 fL   Neutro Abs 4,632 1,500 - 7,800 cells/uL   Lymphs Abs 2,496 850 - 3,900 cells/uL   WBC mixed population 760 200 - 950 cells/uL   Eosinophils Absolute 56 15 - 500 cells/uL   Basophils Absolute 56 0 - 200 cells/uL   Neutrophils Relative % 57.9 %   Total Lymphocyte 31.2 %   Monocytes Relative 9.5 %   Eosinophils Relative 0.7 %   Basophils Relative 0.7 %  COMPLETE METABOLIC PANEL WITH GFR     Status: None   Collection Time: 05/29/17 10:12 AM  Result Value Ref Range   Glucose, Bld 84 65 - 99 mg/dL    Comment: .            Fasting reference interval .    BUN 10 7 - 25 mg/dL   Creat 0.90 0.50 - 1.05 mg/dL    Comment: For patients >26 years of age, the reference limit for Creatinine is approximately 13% higher for people identified as African-American. .    GFR, Est Non African American 70 > OR = 60 mL/min/1.101m2   GFR, Est African American 81 > OR = 60 mL/min/1.20m2   BUN/Creatinine Ratio NOT APPLICABLE 6 - 22 (calc)   Sodium 140 135 - 146 mmol/L   Potassium 4.1 3.5 - 5.3 mmol/L   Chloride 103 98 - 110 mmol/L   CO2 31 20 - 32 mmol/L   Calcium 9.1 8.6 - 10.4 mg/dL   Total Protein 7.2 6.1 - 8.1 g/dL   Albumin  3.6 3.6 - 5.1 g/dL   Globulin  3.6 1.9 - 3.7 g/dL (calc)   AG Ratio 1.0 1.0 - 2.5 (calc)   Total Bilirubin 0.3 0.2 - 1.2 mg/dL   Alkaline phosphatase (APISO) 64 33 - 130 U/L   AST 18 10 - 35 U/L   ALT 15 6 - 29 U/L  COMPLETE METABOLIC PANEL WITH GFR     Status: Abnormal   Collection Time: 06/12/17 10:55 AM  Result Value Ref Range   Glucose, Bld 67 65 - 99 mg/dL    Comment: .            Fasting reference interval .    BUN 12 7 - 25 mg/dL   Creat 0.91 0.50 - 1.05 mg/dL    Comment: For patients >72 years of age, the reference limit for Creatinine is approximately 13% higher for people identified as African-American. .    GFR, Est Non African American 69 > OR = 60 mL/min/1.44m2   GFR, Est African American 80 > OR = 60 mL/min/1.52m2   BUN/Creatinine Ratio NOT APPLICABLE 6 - 22 (calc)   Sodium 141 135 - 146 mmol/L   Potassium 4.5 3.5 - 5.3 mmol/L   Chloride 101 98 - 110 mmol/L   CO2 34 (H) 20 - 32 mmol/L   Calcium 9.2 8.6 - 10.4 mg/dL   Total Protein 7.2 6.1 - 8.1 g/dL   Albumin 3.4 (L) 3.6 - 5.1 g/dL   Globulin 3.8 (H) 1.9 - 3.7 g/dL (calc)   AG Ratio 0.9 (L) 1.0 - 2.5 (calc)   Total Bilirubin 0.3 0.2 - 1.2 mg/dL   Alkaline phosphatase (APISO) 57 33 - 130 U/L   AST 14 10 - 35 U/L   ALT 13 6 - 29 U/L  CBC with Differential/Platelet     Status: Abnormal   Collection Time: 06/12/17 10:55 AM  Result Value Ref Range   WBC 9.8 3.8 - 10.8 Thousand/uL   RBC 4.89 3.80 - 5.10 Million/uL   Hemoglobin 12.9 11.7 - 15.5 g/dL   HCT 39.9 35.0 - 45.0 %   MCV 81.6 80.0 - 100.0 fL   MCH 26.4 (L) 27.0 - 33.0 pg   MCHC 32.3 32.0 - 36.0 g/dL   RDW 14.3 11.0 - 15.0 %   Platelets 387 140 - 400 Thousand/uL   MPV 9.7 7.5 - 12.5 fL   Neutro Abs 5,459 1,500 - 7,800 cells/uL   Lymphs Abs 3,175 850 - 3,900 cells/uL   WBC mixed population 990 (H) 200 - 950 cells/uL   Eosinophils Absolute 88 15 - 500 cells/uL   Basophils Absolute 88 0 - 200 cells/uL   Neutrophils Relative % 55.7 %   Total Lymphocyte 32.4 %   Monocytes Relative  10.1 %   Eosinophils Relative 0.9 %   Basophils Relative 0.9 %     Assessment & Plan  1. Abdominal cramping - dicyclomine (BENTYL) 20 MG tablet; Take 1 tablet (20 mg total) by mouth every 6 (six) hours as needed (Abdominal Cramping/Pain).  Dispense: 15 tablet; Refill: 0  2. Constipation, unspecified constipation type Miralax BID until BM are regular again, then once daily  3. Chronic nausea Continue Zofran PRN for nausea  4. Chronic bilateral low back pain without sciatica Continue follow up with pain management and rheumatologist.   - Due to lack of acute findings and chronicity of nausea, we will treat conservatively for the time being. Pt is advised to avoid meclizine while taking Bentyl and to drink  plenty of fluids. She is in agreement with plan of care, and red flags are discussed in detail as below.  -Red flags and when to present for emergency care or RTC including fever >101.25F, chest pain, shortness of breath,  increased abdominal pain, if your abdomen becomes "tight" or bloated, if you are not passing gas or able to have a bowel movement, if you develop severe nausea and/or vomitingn new/worsening/un-resolving symptoms, reviewed with patient at time of visit. Follow up and care instructions discussed and provided in AVS. - Return in about 2 days (around 06/25/2017), or if symptoms worsen or fail to improve.

## 2017-06-26 ENCOUNTER — Ambulatory Visit: Payer: Medicare Other | Admitting: Family Medicine

## 2017-06-28 ENCOUNTER — Telehealth: Payer: Self-pay | Admitting: Family Medicine

## 2017-06-28 ENCOUNTER — Other Ambulatory Visit: Payer: Self-pay

## 2017-06-28 ENCOUNTER — Ambulatory Visit
Admission: RE | Admit: 2017-06-28 | Discharge: 2017-06-28 | Disposition: A | Discharge status: Home / Self Care | Payer: MEDICARE

## 2017-06-28 DIAGNOSIS — R8290 Unspecified abnormal findings in urine: Secondary | ICD-10-CM | POA: Diagnosis not present

## 2017-06-28 DIAGNOSIS — M3213 Lung involvement in systemic lupus erythematosus: Secondary | ICD-10-CM

## 2017-06-28 LAB — CBC WITH DIFFERENTIAL/PLATELET
BASOS PCT: 1.2 %
Basophils Absolute: 80 cells/uL (ref 0–200)
EOS PCT: 2.5 %
Eosinophils Absolute: 168 cells/uL (ref 15–500)
HCT: 39.2 % (ref 35.0–45.0)
HEMOGLOBIN: 13.1 g/dL (ref 11.7–15.5)
Lymphs Abs: 2466 cells/uL (ref 850–3900)
MCH: 27 pg (ref 27.0–33.0)
MCHC: 33.4 g/dL (ref 32.0–36.0)
MCV: 80.7 fL (ref 80.0–100.0)
MONOS PCT: 11.7 %
MPV: 9.8 fL (ref 7.5–12.5)
NEUTROS ABS: 3203 {cells}/uL (ref 1500–7800)
Neutrophils Relative %: 47.8 %
PLATELETS: 389 10*3/uL (ref 140–400)
RBC: 4.86 10*6/uL (ref 3.80–5.10)
RDW: 14.1 % (ref 11.0–15.0)
Total Lymphocyte: 36.8 %
WBC mixed population: 784 cells/uL (ref 200–950)
WBC: 6.7 10*3/uL (ref 3.8–10.8)

## 2017-06-28 LAB — COMPLETE METABOLIC PANEL WITH GFR
AG RATIO: 0.9 (calc) — AB (ref 1.0–2.5)
ALKALINE PHOSPHATASE (APISO): 59 U/L (ref 33–130)
ALT: 14 U/L (ref 6–29)
AST: 18 U/L (ref 10–35)
Albumin: 3.6 g/dL (ref 3.6–5.1)
BILIRUBIN TOTAL: 0.3 mg/dL (ref 0.2–1.2)
BUN: 12 mg/dL (ref 7–25)
CALCIUM: 9.1 mg/dL (ref 8.6–10.4)
CHLORIDE: 98 mmol/L (ref 98–110)
CO2: 31 mmol/L (ref 20–32)
Creat: 1.05 mg/dL (ref 0.50–1.05)
GFR, Est African American: 67 mL/min/{1.73_m2} (ref >=60)
GFR, Est Non African American: 58 mL/min/{1.73_m2} — ABNORMAL LOW (ref >=60)
Globulin: 3.8 g/dL (calc) — ABNORMAL HIGH (ref 1.9–3.7)
Glucose, Bld: 109 mg/dL — ABNORMAL HIGH (ref 65–99)
POTASSIUM: 4.3 mmol/L (ref 3.5–5.3)
Sodium: 137 mmol/L (ref 135–146)
Total Protein: 7.4 g/dL (ref 6.1–8.1)

## 2017-06-28 NOTE — Telephone Encounter (Signed)
Patient stopped by office today and requested to speak with me - she states she is still having nausea and abdominal cramping. I advised she schedule an appointment with myself or Dr. Ancil Boozer, her PCP. She is in agreement with this plan of care and is sent to the front office staff to schedule.

## 2017-06-29 ENCOUNTER — Encounter: Payer: Self-pay | Admitting: Family Medicine

## 2017-06-29 ENCOUNTER — Ambulatory Visit
Admission: RE | Admit: 2017-06-29 | Discharge: 2017-06-29 | Disposition: A | Discharge status: Home / Self Care | Payer: Medicare Other | Source: Ambulatory Visit | Attending: Family Medicine | Admitting: Family Medicine

## 2017-06-29 ENCOUNTER — Other Ambulatory Visit: Payer: Self-pay | Admitting: Family Medicine

## 2017-06-29 ENCOUNTER — Ambulatory Visit (INDEPENDENT_AMBULATORY_CARE_PROVIDER_SITE_OTHER): Payer: Medicare Other | Admitting: Family Medicine

## 2017-06-29 VITALS — BP 130/80 | HR 100 | Temp 98.5°F | Resp 18 | Ht 68.0 in | Wt 269.1 lb

## 2017-06-29 DIAGNOSIS — K5909 Other constipation: Secondary | ICD-10-CM

## 2017-06-29 DIAGNOSIS — R140 Abdominal distension (gaseous): Secondary | ICD-10-CM | POA: Insufficient documentation

## 2017-06-29 DIAGNOSIS — R110 Nausea: Secondary | ICD-10-CM | POA: Diagnosis not present

## 2017-06-29 DIAGNOSIS — K5900 Constipation, unspecified: Secondary | ICD-10-CM

## 2017-06-29 MED ORDER — PEG 3350-KCL-NABCB-NACL-NASULF 227.1 G PO PACK: 17.0000 g | PACK | Freq: Two times a day (BID) | ORAL | 0 refills | Status: DC

## 2017-06-29 NOTE — Progress Notes (Signed)
Name: Nicole Ballard   MRN: [id number]    DOB: 1956/03/16   Date:06/29/2017       Progress Note  Subjective  Chief Complaint  Chief Complaint  Patient presents with  . Follow-up    still nauseated and bloated    HPI  Patient presents to follow up on nausea, abdominal cramping, and bloating.  She notes that she has continued constipation - she had small BM yesterday that was hard, otherwise no BM since 06/23/2017.  Denies vomiting, she is passing flatus without issue, no diarrhea, no blood in stool or dark and tarry stool, no fevers/chills.  She has been taking 7-9 capfuls of miralax daily and is staying very well hydrated and is still not having regulation of BM's.  Pt saw a specialist in [place] for chronic constipation; also saw PT and was shown how to do abdominal massage to help with constipation.  She has not seen her GI specialist, Dr. Juliane Lack with [facility] since 08/01/2017.  AT this visit, he recommended 9 Capfuls of Miralax and 3 tablets Senakot; also recommended Dulcolax enema and was told to return in 4-6 weeks but never did.  Pt also has chronic nausea, lupus, and fibromyalgia.  Labs performed yesterday - CMP and CBC are non-contributory - normal liver function, WBC, RBC, & kidney function.  Patient Active Problem List   Diagnosis Date Noted  . Vertigo 09/29/2016  . Bronchiolitis obliterans organizing pneumonia ([place]) 09/29/2016  . Neck pain, musculoskeletal 12/15/2015  . Anxiety about health 09/29/2015  . Abnormal CAT scan 04/11/2015  . Auditory impairment 04/11/2015  . Insomnia, persistent 04/11/2015  . Chronic kidney disease (CKD), stage I 04/11/2015  . Chronic nonmalignant pain 04/11/2015  . Chronic constipation 04/11/2015  . Dyslipidemia 04/11/2015  . Fibromyalgia 04/11/2015  . Gastro-esophageal reflux disease without esophagitis 04/11/2015  . Dysphonia 04/11/2015  . Absence of bladder continence 04/11/2015  . Low back pain 04/11/2015  . Eczema intertrigo  04/11/2015  . Keratosis pilaris 04/11/2015  . Chronic recurrent major depressive disorder ([place]) 04/11/2015  . Dysmetabolic syndrome 81/09/7508  . Neurogenic claudication 04/11/2015  . Perennial allergic rhinitis with seasonal variation 04/11/2015  . Abnormal presence of protein in urine 04/11/2015  . Seborrhea capitis 04/11/2015  . Moderate dysplasia of vulva 04/11/2015  . Vitamin D deficiency 04/11/2015  . Treadmill stress test negative for angina pectoris 03/05/2015  . Shortness of breath 05/20/2014  . Morbid obesity ([place]) 02/03/2014  . Asthma, chronic 12/31/2013  . H/O total knee replacement 12/31/2013  . Episcleritis 09/26/2013  . Vocal cord dysfunction 05/28/2013  . Anxiety, generalized 03/19/2013  . Back pain 12/18/2012  . Pulmonary nodules 11/26/2012  . Lung nodule, multiple 11/26/2012  . Arthritis, degenerative 11/01/2012  . H/O aspiration pneumonitis 10/22/2012  . Postinflammatory pulmonary fibrosis ([place]) 10/22/2012  . Mixed incontinence 05/04/2012  . Benign neoplasm of pituitary gland and craniopharyngeal duct ([place]) 12/29/2010  . Lung involvement in systemic lupus erythematosus ([place]) 11/05/2010  . Chronic nausea 07/01/2008  . Benign hypertension 01/25/2005    Social History  Substance Use Topics  . Smoking status: Never Smoker  . Smokeless tobacco: Never Used  . Alcohol use No     Current Outpatient Prescriptions:  .  albuterol (PROAIR HFA) 108 (90 BASE) MCG/ACT inhaler, Inhale 2 puffs into the lungs every 6 (six) hours as needed for wheezing. DX code R06.02, Disp: 3 Inhaler, Rfl: 3 .  azelastine (OPTIVAR) 0.05 % ophthalmic solution, Place 1 drop into both eyes 2 (two) times daily.,  Disp: 6 mL, Rfl: 1 .  buPROPion (WELLBUTRIN XL) 300 MG 24 hr tablet, Take 1 tablet by mouth daily., Disp: , Rfl:  .  Cholecalciferol (VITAMIN D) 2000 UNITS CAPS, Take 1 capsule by mouth daily., Disp: , Rfl:  .  clonazePAM (KLONOPIN) 0.5 MG tablet, Take 0.5 tablets (0.25 mg total) by  mouth daily., Disp: 15 tablet, Rfl: 0 .  cyclobenzaprine (FLEXERIL) 5 MG tablet, Take 1 tablet (5 mg total) by mouth every 8 (eight) hours as needed for muscle spasms. Do not drive for 8 hours, Disp: 90 tablet, Rfl: 1 .  diclofenac sodium (VOLTAREN) 1 % GEL, APP 2 GRAMS EXT AA QID, Disp: , Rfl: 6 .  dicyclomine (BENTYL) 20 MG tablet, Take 1 tablet (20 mg total) by mouth every 6 (six) hours as needed (Abdominal Cramping/Pain)., Disp: 15 tablet, Rfl: 0 .  DULoxetine (CYMBALTA) 60 MG capsule, Take 120 mg by mouth at bedtime. , Disp: , Rfl:  .  FLOVENT HFA 110 MCG/ACT inhaler, INHALE 2 PUFFS PO BID, Disp: , Rfl: 0 .  fluticasone (FLONASE) 50 MCG/ACT nasal spray, USE 2 SPRAYS IN EACH  NOSTRIL DAILY, Disp: 48 g, Rfl: 1 .  gabapentin (NEURONTIN) 300 MG capsule, Take 1 capsule by mouth 3 (three) times daily., Disp: , Rfl:  .  gentamicin (GARAMYCIN) 0.3 % ophthalmic solution, Place 2 drops into both eyes 4 (four) times daily., Disp: 5 mL, Rfl: 0 .  hydroxychloroquine (PLAQUENIL) 200 MG tablet, Take 200 mg by mouth 2 (two) times daily., Disp: , Rfl:  .  lamoTRIgine (LAMICTAL) 100 MG tablet, Take 1 tablet by mouth 3 (three) times daily., Disp: , Rfl:  .  losartan-hydrochlorothiazide (HYZAAR) 50-12.5 MG tablet, Take 0.5 tablets by mouth daily., Disp: 45 tablet, Rfl: 0 .  meclizine (ANTIVERT) 25 MG tablet, Take 1 tablet (25 mg total) by mouth 3 (three) times daily as needed., Disp: 30 tablet, Rfl: 0 .  Multiple Vitamin (MULTIVITAMIN) tablet, Take 1 tablet by mouth daily., Disp: , Rfl:  .  mycophenolate (CELLCEPT) 500 MG tablet, Take 2 tablets (1,000 mg total) by mouth 2 (two) times daily., Disp: 5 tablet, Rfl: 0 .  omeprazole (PRILOSEC) 40 MG capsule, Take 1 capsule (40 mg total) by mouth daily., Disp: 90 capsule, Rfl: 1 .  ondansetron (ZOFRAN) 4 MG tablet, Take 1 tablet (4 mg total) by mouth every 8 (eight) hours as needed for nausea or vomiting., Disp: 60 tablet, Rfl: 0 .  polyethylene glycol powder  (GLYCOLAX/MIRALAX) powder, 2 cap fulls in a full glass of water, three times a day, for 5 days., Disp: 255 g, Rfl: 0 .  topiramate (TOPAMAX) 50 MG tablet, Take 1 tablet by mouth 3 (three) times daily. Dr. [place] James - Pain management, Disp: , Rfl:  .  traZODone (DESYREL) 100 MG tablet, Take 1 tablet by mouth at bedtime., Disp: , Rfl:  .  triamcinolone ointment (KENALOG) 0.1 %, , Disp: , Rfl:  .  Vitamin D, Ergocalciferol, (DRISDOL) 50000 units CAPS capsule, TAKE 1 CAPSULE BY MOUTH EVERY 7 DAYS, Disp: 12 capsule, Rfl: 0  Allergies  Allergen Reactions  . Ace Inhibitors     Other reaction(s): OTHER Pt states she can not take ace inhibitors. Pt states she can not remember her reaction.   Marland Kitchen Penicillins     ROS  Ten systems reviewed and is negative except as mentioned in HPI  Objective  Vitals:   06/29/17 0851  BP: 130/80  Pulse: 100  Resp: 18  Temp:  98.5 F (36.9 C)  TempSrc: Oral  SpO2: 96%  Weight: 269 lb 1.6 oz (122.1 kg)  Height: 5\' 8"  (1.727 m)   Body mass index is 40.92 kg/m.  Nursing Note and Vital Signs reviewed.  Physical Exam  Constitutional: Patient appears well-developed and well-nourished. Obese No distress.  HEENT: head atraumatic, normocephalic Cardiovascular: Normal rate, regular rhythm, S1/S2 present.  No murmur or rub heard. No BLE edema. Pulmonary/Chest: Effort normal and breath sounds clear. No respiratory distress or retractions. Abdominal: Obese abdomen. Soft with mild distension in upper abdomen; tenderness in upper central and LUQ of abdomen, hypoactive bowel sounds present x4 quadrants.  Psychiatric: Patient has a normal mood and affect. behavior is normal. Judgment and thought content normal.  Recent Results (from the past 2160 hour(s))  TSH     Status: None   Collection Time: 05/17/17  3:05 PM  Result Value Ref Range   TSH 0.73 0.40 - 4.50 mIU/L  CBC with Differential/Platelet     Status: Abnormal   Collection Time: 05/29/17 10:12 AM   Result Value Ref Range   WBC 8.0 3.8 - 10.8 Thousand/uL   RBC 4.98 3.80 - 5.10 Million/uL   Hemoglobin 13.4 11.7 - 15.5 g/dL   HCT 39.9 35.0 - 45.0 %   MCV 80.1 80.0 - 100.0 fL   MCH 26.9 (L) 27.0 - 33.0 pg   MCHC 33.6 32.0 - 36.0 g/dL   RDW 14.6 11.0 - 15.0 %   Platelets 384 140 - 400 Thousand/uL   MPV 9.7 7.5 - 12.5 fL   Neutro Abs 4,632 1,500 - 7,800 cells/uL   Lymphs Abs 2,496 850 - 3,900 cells/uL   WBC mixed population 760 200 - 950 cells/uL   Eosinophils Absolute 56 15 - 500 cells/uL   Basophils Absolute 56 0 - 200 cells/uL   Neutrophils Relative % 57.9 %   Total Lymphocyte 31.2 %   Monocytes Relative 9.5 %   Eosinophils Relative 0.7 %   Basophils Relative 0.7 %  COMPLETE METABOLIC PANEL WITH GFR     Status: None   Collection Time: 05/29/17 10:12 AM  Result Value Ref Range   Glucose, Bld 84 65 - 99 mg/dL    Comment: .            Fasting reference interval .    BUN 10 7 - 25 mg/dL   Creat 0.90 0.50 - 1.05 mg/dL    Comment: For patients >21 years of age, the reference limit for Creatinine is approximately 13% higher for people identified as African-American. .    GFR, Est Non African American 70 > OR = 60 mL/min/1.53m2   GFR, Est African American 81 > OR = 60 mL/min/1.29m2   BUN/Creatinine Ratio NOT APPLICABLE 6 - 22 (calc)   Sodium 140 135 - 146 mmol/L   Potassium 4.1 3.5 - 5.3 mmol/L   Chloride 103 98 - 110 mmol/L   CO2 31 20 - 32 mmol/L   Calcium 9.1 8.6 - 10.4 mg/dL   Total Protein 7.2 6.1 - 8.1 g/dL   Albumin 3.6 3.6 - 5.1 g/dL   Globulin 3.6 1.9 - 3.7 g/dL (calc)   AG Ratio 1.0 1.0 - 2.5 (calc)   Total Bilirubin 0.3 0.2 - 1.2 mg/dL   Alkaline phosphatase (APISO) 64 33 - 130 U/L   AST 18 10 - 35 U/L   ALT 15 6 - 29 U/L  COMPLETE METABOLIC PANEL WITH GFR     Status: Abnormal   Collection  Time: 06/12/17 10:55 AM  Result Value Ref Range   Glucose, Bld 67 65 - 99 mg/dL    Comment: .            Fasting reference interval .    BUN 12 7 - 25 mg/dL    Creat 0.91 0.50 - 1.05 mg/dL    Comment: For patients >65 years of age, the reference limit for Creatinine is approximately 13% higher for people identified as African-American. .    GFR, Est Non African American 69 > OR = 60 mL/min/1.46m2   GFR, Est African American 80 > OR = 60 mL/min/1.56m2   BUN/Creatinine Ratio NOT APPLICABLE 6 - 22 (calc)   Sodium 141 135 - 146 mmol/L   Potassium 4.5 3.5 - 5.3 mmol/L   Chloride 101 98 - 110 mmol/L   CO2 34 (H) 20 - 32 mmol/L   Calcium 9.2 8.6 - 10.4 mg/dL   Total Protein 7.2 6.1 - 8.1 g/dL   Albumin 3.4 (L) 3.6 - 5.1 g/dL   Globulin 3.8 (H) 1.9 - 3.7 g/dL (calc)   AG Ratio 0.9 (L) 1.0 - 2.5 (calc)   Total Bilirubin 0.3 0.2 - 1.2 mg/dL   Alkaline phosphatase (APISO) 57 33 - 130 U/L   AST 14 10 - 35 U/L   ALT 13 6 - 29 U/L  CBC with Differential/Platelet     Status: Abnormal   Collection Time: 06/12/17 10:55 AM  Result Value Ref Range   WBC 9.8 3.8 - 10.8 Thousand/uL   RBC 4.89 3.80 - 5.10 Million/uL   Hemoglobin 12.9 11.7 - 15.5 g/dL   HCT 39.9 35.0 - 45.0 %   MCV 81.6 80.0 - 100.0 fL   MCH 26.4 (L) 27.0 - 33.0 pg   MCHC 32.3 32.0 - 36.0 g/dL   RDW 14.3 11.0 - 15.0 %   Platelets 387 140 - 400 Thousand/uL   MPV 9.7 7.5 - 12.5 fL   Neutro Abs 5,459 1,500 - 7,800 cells/uL   Lymphs Abs 3,175 850 - 3,900 cells/uL   WBC mixed population 990 (H) 200 - 950 cells/uL   Eosinophils Absolute 88 15 - 500 cells/uL   Basophils Absolute 88 0 - 200 cells/uL   Neutrophils Relative % 55.7 %   Total Lymphocyte 32.4 %   Monocytes Relative 10.1 %   Eosinophils Relative 0.9 %   Basophils Relative 0.9 %  COMPLETE METABOLIC PANEL WITH GFR     Status: Abnormal   Collection Time: 06/28/17  8:29 AM  Result Value Ref Range   Glucose, Bld 109 (H) 65 - 99 mg/dL    Comment: .            Fasting reference interval . For someone without known diabetes, a glucose value between 100 and 125 mg/dL is consistent with prediabetes and should be confirmed with  a follow-up test. .    BUN 12 7 - 25 mg/dL   Creat 1.05 0.50 - 1.05 mg/dL    Comment: For patients >45 years of age, the reference limit for Creatinine is approximately 13% higher for people identified as African-American. .    GFR, Est Non African American 58 (L) > OR = 60 mL/min/1.41m2   GFR, Est African American 67 > OR = 60 mL/min/1.72m2   BUN/Creatinine Ratio NOT APPLICABLE 6 - 22 (calc)   Sodium 137 135 - 146 mmol/L   Potassium 4.3 3.5 - 5.3 mmol/L   Chloride 98 98 - 110 mmol/L   CO2  31 20 - 32 mmol/L   Calcium 9.1 8.6 - 10.4 mg/dL   Total Protein 7.4 6.1 - 8.1 g/dL   Albumin 3.6 3.6 - 5.1 g/dL   Globulin 3.8 (H) 1.9 - 3.7 g/dL (calc)   AG Ratio 0.9 (L) 1.0 - 2.5 (calc)   Total Bilirubin 0.3 0.2 - 1.2 mg/dL   Alkaline phosphatase (APISO) 59 33 - 130 U/L   AST 18 10 - 35 U/L   ALT 14 6 - 29 U/L  CBC with Differential/Platelet     Status: None   Collection Time: 06/28/17  8:29 AM  Result Value Ref Range   WBC 6.7 3.8 - 10.8 Thousand/uL   RBC 4.86 3.80 - 5.10 Million/uL   Hemoglobin 13.1 11.7 - 15.5 g/dL   HCT 39.2 35.0 - 45.0 %   MCV 80.7 80.0 - 100.0 fL   MCH 27.0 27.0 - 33.0 pg   MCHC 33.4 32.0 - 36.0 g/dL   RDW 14.1 11.0 - 15.0 %   Platelets 389 140 - 400 Thousand/uL   MPV 9.8 7.5 - 12.5 fL   Neutro Abs 3,203 1,500 - 7,800 cells/uL   Lymphs Abs 2,466 850 - 3,900 cells/uL   WBC mixed population 784 200 - 950 cells/uL   Eosinophils Absolute 168 15 - 500 cells/uL   Basophils Absolute 80 0 - 200 cells/uL   Neutrophils Relative % 47.8 %   Total Lymphocyte 36.8 %   Monocytes Relative 11.7 %   Eosinophils Relative 2.5 %   Basophils Relative 1.2 %     Assessment & Plan  1. Chronic constipation - DG Abd 2 Views; Future  2. Nausea - DG Abd 2 Views; Future  3. Abdominal bloating - DG Abd 2 Views; Future  - Spoke with [facility] - 2 Views to including KUB & Lateral views.  Advised pt that we will determine course of action based on results of imaging.  If  significantly impacted, we will call GI specialist to request further instruction and to set up follow up appt. -Red flags and when to present for emergency care or RTC including fever >101.57F, chest pain, shortness of breath, new/worsening/un-resolving symptoms, red flags in AVS reviewed with patient at time of visit. Follow up and care instructions discussed and provided in AVS.

## 2017-06-30 ENCOUNTER — Encounter: Payer: Self-pay | Admitting: Family Medicine

## 2017-07-03 ENCOUNTER — Ambulatory Visit
Admission: RE | Admit: 2017-07-03 | Discharge: 2017-07-03 | Disposition: A | Discharge status: Home / Self Care | Payer: MEDICARE | Attending: Critical Care Medicine | Admitting: Critical Care Medicine

## 2017-07-03 ENCOUNTER — Ambulatory Visit
Admission: RE | Admit: 2017-07-03 | Discharge: 2017-07-03 | Disposition: A | Discharge status: Home / Self Care | Payer: MEDICARE | Attending: Psychologist | Admitting: Psychologist

## 2017-07-03 ENCOUNTER — Ambulatory Visit
Admission: RE | Admit: 2017-07-03 | Discharge: 2017-07-03 | Disposition: A | Discharge status: Home / Self Care | Payer: MEDICARE

## 2017-07-03 DIAGNOSIS — J45909 Unspecified asthma, uncomplicated: Secondary | ICD-10-CM | POA: Diagnosis not present

## 2017-07-03 DIAGNOSIS — Z9289 Personal history of other medical treatment: Secondary | ICD-10-CM | POA: Diagnosis not present

## 2017-07-03 DIAGNOSIS — K219 Gastro-esophageal reflux disease without esophagitis: Secondary | ICD-10-CM | POA: Diagnosis not present

## 2017-07-03 DIAGNOSIS — M797 Fibromyalgia: Secondary | ICD-10-CM | POA: Diagnosis not present

## 2017-07-03 DIAGNOSIS — Z880 Allergy status to penicillin: Secondary | ICD-10-CM | POA: Diagnosis not present

## 2017-07-03 DIAGNOSIS — H905 Unspecified sensorineural hearing loss: Secondary | ICD-10-CM | POA: Diagnosis not present

## 2017-07-03 DIAGNOSIS — M1990 Unspecified osteoarthritis, unspecified site: Secondary | ICD-10-CM | POA: Diagnosis not present

## 2017-07-03 DIAGNOSIS — G894 Chronic pain syndrome: Secondary | ICD-10-CM | POA: Diagnosis not present

## 2017-07-03 DIAGNOSIS — J8489 Other specified interstitial pulmonary diseases: Secondary | ICD-10-CM | POA: Diagnosis not present

## 2017-07-03 DIAGNOSIS — J84116 Cryptogenic organizing pneumonia: Secondary | ICD-10-CM | POA: Diagnosis not present

## 2017-07-03 DIAGNOSIS — N181 Chronic kidney disease, stage 1: Secondary | ICD-10-CM | POA: Diagnosis not present

## 2017-07-03 DIAGNOSIS — I129 Hypertensive chronic kidney disease with stage 1 through stage 4 chronic kidney disease, or unspecified chronic kidney disease: Secondary | ICD-10-CM | POA: Diagnosis not present

## 2017-07-03 DIAGNOSIS — Z7951 Long term (current) use of inhaled steroids: Secondary | ICD-10-CM | POA: Diagnosis not present

## 2017-07-03 DIAGNOSIS — F411 Generalized anxiety disorder: Principal | ICD-10-CM

## 2017-07-03 DIAGNOSIS — F4310 Post-traumatic stress disorder, unspecified: Secondary | ICD-10-CM

## 2017-07-03 DIAGNOSIS — F331 Major depressive disorder, recurrent, moderate: Secondary | ICD-10-CM

## 2017-07-03 MED ORDER — MYCOPHENOLATE MOFETIL 500 MG TABLET
ORAL_TABLET | Freq: Two times a day (BID) | ORAL | 0 refills | 0 days | Status: CP
Start: 2017-07-03 — End: 2017-08-03

## 2017-07-03 MED ORDER — PREDNISONE 10 MG TABLET
ORAL_TABLET | Freq: Every day | ORAL | 0 refills | 0.00000 days | Status: CP
Start: 2017-07-03 — End: 2017-09-01

## 2017-07-03 MED ORDER — ESTRADIOL 10 MCG VAGINAL TABLET
ORAL_TABLET | VAGINAL | 8 refills | 0 days | Status: CP
Start: 2017-07-03 — End: 2019-01-22

## 2017-07-04 ENCOUNTER — Encounter: Payer: Self-pay | Admitting: Family Medicine

## 2017-07-05 ENCOUNTER — Other Ambulatory Visit: Payer: Self-pay | Admitting: Family Medicine

## 2017-07-05 DIAGNOSIS — I10 Essential (primary) hypertension: Secondary | ICD-10-CM

## 2017-07-05 NOTE — Telephone Encounter (Signed)
Refill request for Hypertension medication: Hyzaar  Last office visit: 06/29/2017  BP Readings from Last 3 Encounters:  06/29/17 130/80  06/23/17 124/70  05/17/17 124/68     Lab Results  Component Value Date   CREATININE 1.05 06/28/2017   BUN 12 06/28/2017   NA 137 06/28/2017   K 4.3 06/28/2017   CL 98 06/28/2017   CO2 31 06/28/2017    Follow up: 07/31/2017

## 2017-07-07 ENCOUNTER — Encounter: Payer: Self-pay | Admitting: Family Medicine

## 2017-07-07 ENCOUNTER — Ambulatory Visit (INDEPENDENT_AMBULATORY_CARE_PROVIDER_SITE_OTHER): Payer: Medicare Other | Admitting: Family Medicine

## 2017-07-07 VITALS — BP 118/88 | HR 83 | Resp 14 | Ht 68.0 in | Wt 272.5 lb

## 2017-07-07 DIAGNOSIS — E8881 Metabolic syndrome: Secondary | ICD-10-CM | POA: Diagnosis not present

## 2017-07-07 LAB — POCT GLYCOSYLATED HEMOGLOBIN (HGB A1C): Hemoglobin A1C: 6.5

## 2017-07-07 MED ORDER — METFORMIN HCL 500 MG PO TABS: 500.0000 mg | ORAL_TABLET | Freq: Two times a day (BID) | ORAL | 3 refills | Status: DC

## 2017-07-07 NOTE — Progress Notes (Signed)
Name: Nicole Ballard   MRN: [id number]    DOB: 1956-01-08   Date:07/07/2017       Progress Note  Subjective  Chief Complaint  Chief Complaint  Patient presents with  . Obesity  . Hyperglycemia    HPI  Metabolic syndrome/obesity: she is very frustrated about weight gain, she is taking high dose prednisone for almost one year and keeps gaining weight. She also has  Metabolic syndrome : increase in abdominal girth, low HDL, high glucose and hgbA1C , discussed life style modification and since she has morbid obesity we will try adding Metformin and if not tolerated GLP- 1 agonist. She is trying to eat healthy, she states portion control and will start walking again to try to lose weight.    Patient Active Problem List   Diagnosis Date Noted  . Vertigo 09/29/2016  . Bronchiolitis obliterans organizing pneumonia ([place]) 09/29/2016  . Neck pain, musculoskeletal 12/15/2015  . Anxiety about health 09/29/2015  . Abnormal CAT scan 04/11/2015  . Auditory impairment 04/11/2015  . Insomnia, persistent 04/11/2015  . Chronic kidney disease (CKD), stage I 04/11/2015  . Chronic nonmalignant pain 04/11/2015  . Chronic constipation 04/11/2015  . Dyslipidemia 04/11/2015  . Fibromyalgia 04/11/2015  . Gastro-esophageal reflux disease without esophagitis 04/11/2015  . Dysphonia 04/11/2015  . Absence of bladder continence 04/11/2015  . Low back pain 04/11/2015  . Eczema intertrigo 04/11/2015  . Keratosis pilaris 04/11/2015  . Chronic recurrent major depressive disorder ([place]) 04/11/2015  . Dysmetabolic syndrome 75/06/2584  . Neurogenic claudication 04/11/2015  . Perennial allergic rhinitis with seasonal variation 04/11/2015  . Abnormal presence of protein in urine 04/11/2015  . Seborrhea capitis 04/11/2015  . Moderate dysplasia of vulva 04/11/2015  . Vitamin D deficiency 04/11/2015  . Treadmill stress test negative for angina pectoris 03/05/2015  . Shortness of breath 05/20/2014  . Morbid  obesity ([place]) 02/03/2014  . Asthma, chronic 12/31/2013  . H/O total knee replacement 12/31/2013  . Episcleritis 09/26/2013  . Vocal cord dysfunction 05/28/2013  . Anxiety, generalized 03/19/2013  . Back pain 12/18/2012  . Pulmonary nodules 11/26/2012  . Lung nodule, multiple 11/26/2012  . Arthritis, degenerative 11/01/2012  . H/O aspiration pneumonitis 10/22/2012  . Postinflammatory pulmonary fibrosis ([place]) 10/22/2012  . Mixed incontinence 05/04/2012  . Benign neoplasm of pituitary gland and craniopharyngeal duct ([place]) 12/29/2010  . Lung involvement in systemic lupus erythematosus ([place]) 11/05/2010  . Chronic nausea 07/01/2008  . Benign hypertension 01/25/2005    Past Surgical History:  Procedure Laterality Date  . BRAIN SURGERY    . CHOLECYSTECTOMY    . VAGINAL HYSTERECTOMY      Family History  Problem Relation Age of Onset  . Breast cancer Mother   . Cancer Mother 75       Breast  . Cancer Father 45       Stomach  . Breast cancer Maternal Aunt     Social History   Social History  . Marital status: Divorced    Spouse name: N/A  . Number of children: N/A  . Years of education: N/A   Occupational History  . Not on file.   Social History Main Topics  . Smoking status: Never Smoker  . Smokeless tobacco: Never Used  . Alcohol use No  . Drug use: No  . Sexual activity: Not Currently   Other Topics Concern  . Not on file   Social History Narrative  . No narrative on file     Current Outpatient Prescriptions:  .  albuterol (PROAIR HFA) 108 (90 BASE) MCG/ACT inhaler, Inhale 2 puffs into the lungs every 6 (six) hours as needed for wheezing. DX code R06.02, Disp: 3 Inhaler, Rfl: 3 .  azelastine (OPTIVAR) 0.05 % ophthalmic solution, Place 1 drop into both eyes 2 (two) times daily., Disp: 6 mL, Rfl: 1 .  buPROPion (WELLBUTRIN XL) 300 MG 24 hr tablet, Take 1 tablet by mouth daily., Disp: , Rfl:  .  Cholecalciferol (VITAMIN D) 2000 UNITS CAPS, Take 1 capsule by  mouth daily., Disp: , Rfl:  .  clonazePAM (KLONOPIN) 0.5 MG tablet, Take 0.5 tablets (0.25 mg total) by mouth daily., Disp: 15 tablet, Rfl: 0 .  cyclobenzaprine (FLEXERIL) 5 MG tablet, Take 1 tablet (5 mg total) by mouth every 8 (eight) hours as needed for muscle spasms. Do not drive for 8 hours, Disp: 90 tablet, Rfl: 1 .  diclofenac sodium (VOLTAREN) 1 % GEL, APP 2 GRAMS EXT AA QID, Disp: , Rfl: 6 .  dicyclomine (BENTYL) 20 MG tablet, Take 1 tablet (20 mg total) by mouth every 6 (six) hours as needed (Abdominal Cramping/Pain)., Disp: 15 tablet, Rfl: 0 .  DULoxetine (CYMBALTA) 60 MG capsule, Take 120 mg by mouth at bedtime. , Disp: , Rfl:  .  FLOVENT HFA 110 MCG/ACT inhaler, INHALE 2 PUFFS PO BID, Disp: , Rfl: 0 .  fluticasone (FLONASE) 50 MCG/ACT nasal spray, USE 2 SPRAYS IN EACH  NOSTRIL DAILY, Disp: 48 g, Rfl: 1 .  gabapentin (NEURONTIN) 300 MG capsule, Take 1 capsule by mouth 3 (three) times daily., Disp: , Rfl:  .  gentamicin (GARAMYCIN) 0.3 % ophthalmic solution, Place 2 drops into both eyes 4 (four) times daily., Disp: 5 mL, Rfl: 0 .  hydroxychloroquine (PLAQUENIL) 200 MG tablet, Take 200 mg by mouth 2 (two) times daily., Disp: , Rfl:  .  lamoTRIgine (LAMICTAL) 100 MG tablet, Take 1 tablet by mouth 3 (three) times daily., Disp: , Rfl:  .  losartan-hydrochlorothiazide (HYZAAR) 50-12.5 MG tablet, Take 0.5 tablets by mouth daily., Disp: 45 tablet, Rfl: 0 .  meclizine (ANTIVERT) 25 MG tablet, Take 1 tablet (25 mg total) by mouth 3 (three) times daily as needed., Disp: 30 tablet, Rfl: 0 .  Multiple Vitamin (MULTIVITAMIN) tablet, Take 1 tablet by mouth daily., Disp: , Rfl:  .  mycophenolate (CELLCEPT) 500 MG tablet, Take 2 tablets (1,000 mg total) by mouth 2 (two) times daily., Disp: 5 tablet, Rfl: 0 .  omeprazole (PRILOSEC) 40 MG capsule, Take 1 capsule (40 mg total) by mouth daily., Disp: 90 capsule, Rfl: 1 .  ondansetron (ZOFRAN) 4 MG tablet, Take 1 tablet (4 mg total) by mouth every 8 (eight)  hours as needed for nausea or vomiting., Disp: 60 tablet, Rfl: 0 .  polyethylene glycol powder (GLYCOLAX/MIRALAX) powder, 2 cap fulls in a full glass of water, three times a day, for 5 days., Disp: 255 g, Rfl: 0 .  topiramate (TOPAMAX) 50 MG tablet, Take 1 tablet by mouth 3 (three) times daily. Dr. [place] James - Pain management, Disp: , Rfl:  .  traZODone (DESYREL) 100 MG tablet, Take 1 tablet by mouth at bedtime., Disp: , Rfl:  .  triamcinolone ointment (KENALOG) 0.1 %, , Disp: , Rfl:  .  metFORMIN (GLUCOPHAGE) 500 MG tablet, Take 1 tablet (500 mg total) by mouth 2 (two) times daily with a meal., Disp: 180 tablet, Rfl: 3 .  PEG 3350-KCl-NaBcb-NaCl-NaSulf (GOLYTELY) 227.1 g PACK, Take 17 g by mouth 2 (two) times daily. Until constipation is  releived (Patient not taking: Reported on 07/07/2017), Disp: 1 each, Rfl: 0 .  predniSONE (DELTASONE) 10 MG tablet, Take 15 mg by mouth daily., Disp: , Rfl:  .  Vitamin D, Ergocalciferol, (DRISDOL) 50000 units CAPS capsule, TAKE 1 CAPSULE BY MOUTH EVERY 7 DAYS (Patient not taking: Reported on 07/07/2017), Disp: 12 capsule, Rfl: 0 .  YUVAFEM 10 MCG TABS vaginal tablet, Place 1 tablet vaginally once a week., Disp: , Rfl:   Allergies  Allergen Reactions  . Ace Inhibitors     Other reaction(s): OTHER Pt states she can not take ace inhibitors. Pt states she can not remember her reaction.   Marland Kitchen Penicillins      ROS  Ten systems reviewed and is negative except as mentioned in HPI    Objective  Vitals:   07/07/17 1149  BP: 118/88  Pulse: 83  Resp: 14  SpO2: 96%  Weight: 272 lb 8 oz (123.6 kg)  Height: 5\' 8"  (1.727 m)    Body mass index is 41.43 kg/m.  Physical Exam  Constitutional: Patient appears well-developed and well-nourished. Obese  No distress.  HEENT: head atraumatic, normocephalic, pupils equal and reactive to light, neck supple, throat within normal limits Cardiovascular: Normal rate, regular rhythm and normal heart sounds.  No murmur  heard. No BLE edema. Pulmonary/Chest: Effort normal and breath sounds normal. No respiratory distress. Abdominal: Soft.  There is no tenderness. Psychiatric: Patient has a normal mood and affect. behavior is normal. Judgment and thought content normal.  Recent Results (from the past 2160 hour(s))  TSH     Status: None   Collection Time: 05/17/17  3:05 PM  Result Value Ref Range   TSH 0.73 0.40 - 4.50 mIU/L  CBC with Differential/Platelet     Status: Abnormal   Collection Time: 05/29/17 10:12 AM  Result Value Ref Range   WBC 8.0 3.8 - 10.8 Thousand/uL   RBC 4.98 3.80 - 5.10 Million/uL   Hemoglobin 13.4 11.7 - 15.5 g/dL   HCT 39.9 35.0 - 45.0 %   MCV 80.1 80.0 - 100.0 fL   MCH 26.9 (L) 27.0 - 33.0 pg   MCHC 33.6 32.0 - 36.0 g/dL   RDW 14.6 11.0 - 15.0 %   Platelets 384 140 - 400 Thousand/uL   MPV 9.7 7.5 - 12.5 fL   Neutro Abs 4,632 1,500 - 7,800 cells/uL   Lymphs Abs 2,496 850 - 3,900 cells/uL   WBC mixed population 760 200 - 950 cells/uL   Eosinophils Absolute 56 15 - 500 cells/uL   Basophils Absolute 56 0 - 200 cells/uL   Neutrophils Relative % 57.9 %   Total Lymphocyte 31.2 %   Monocytes Relative 9.5 %   Eosinophils Relative 0.7 %   Basophils Relative 0.7 %  COMPLETE METABOLIC PANEL WITH GFR     Status: None   Collection Time: 05/29/17 10:12 AM  Result Value Ref Range   Glucose, Bld 84 65 - 99 mg/dL    Comment: .            Fasting reference interval .    BUN 10 7 - 25 mg/dL   Creat 0.90 0.50 - 1.05 mg/dL    Comment: For patients >76 years of age, the reference limit for Creatinine is approximately 13% higher for people identified as African-American. .    GFR, Est Non African American 70 > OR = 60 mL/min/1.10m2   GFR, Est African American 81 > OR = 60 mL/min/1.29m2   BUN/Creatinine Ratio NOT  APPLICABLE 6 - 22 (calc)   Sodium 140 135 - 146 mmol/L   Potassium 4.1 3.5 - 5.3 mmol/L   Chloride 103 98 - 110 mmol/L   CO2 31 20 - 32 mmol/L   Calcium 9.1 8.6 - 10.4  mg/dL   Total Protein 7.2 6.1 - 8.1 g/dL   Albumin 3.6 3.6 - 5.1 g/dL   Globulin 3.6 1.9 - 3.7 g/dL (calc)   AG Ratio 1.0 1.0 - 2.5 (calc)   Total Bilirubin 0.3 0.2 - 1.2 mg/dL   Alkaline phosphatase (APISO) 64 33 - 130 U/L   AST 18 10 - 35 U/L   ALT 15 6 - 29 U/L  COMPLETE METABOLIC PANEL WITH GFR     Status: Abnormal   Collection Time: 06/12/17 10:55 AM  Result Value Ref Range   Glucose, Bld 67 65 - 99 mg/dL    Comment: .            Fasting reference interval .    BUN 12 7 - 25 mg/dL   Creat 0.91 0.50 - 1.05 mg/dL    Comment: For patients >78 years of age, the reference limit for Creatinine is approximately 13% higher for people identified as African-American. .    GFR, Est Non African American 69 > OR = 60 mL/min/1.59m2   GFR, Est African American 80 > OR = 60 mL/min/1.85m2   BUN/Creatinine Ratio NOT APPLICABLE 6 - 22 (calc)   Sodium 141 135 - 146 mmol/L   Potassium 4.5 3.5 - 5.3 mmol/L   Chloride 101 98 - 110 mmol/L   CO2 34 (H) 20 - 32 mmol/L   Calcium 9.2 8.6 - 10.4 mg/dL   Total Protein 7.2 6.1 - 8.1 g/dL   Albumin 3.4 (L) 3.6 - 5.1 g/dL   Globulin 3.8 (H) 1.9 - 3.7 g/dL (calc)   AG Ratio 0.9 (L) 1.0 - 2.5 (calc)   Total Bilirubin 0.3 0.2 - 1.2 mg/dL   Alkaline phosphatase (APISO) 57 33 - 130 U/L   AST 14 10 - 35 U/L   ALT 13 6 - 29 U/L  CBC with Differential/Platelet     Status: Abnormal   Collection Time: 06/12/17 10:55 AM  Result Value Ref Range   WBC 9.8 3.8 - 10.8 Thousand/uL   RBC 4.89 3.80 - 5.10 Million/uL   Hemoglobin 12.9 11.7 - 15.5 g/dL   HCT 39.9 35.0 - 45.0 %   MCV 81.6 80.0 - 100.0 fL   MCH 26.4 (L) 27.0 - 33.0 pg   MCHC 32.3 32.0 - 36.0 g/dL   RDW 14.3 11.0 - 15.0 %   Platelets 387 140 - 400 Thousand/uL   MPV 9.7 7.5 - 12.5 fL   Neutro Abs 5,459 1,500 - 7,800 cells/uL   Lymphs Abs 3,175 850 - 3,900 cells/uL   WBC mixed population 990 (H) 200 - 950 cells/uL   Eosinophils Absolute 88 15 - 500 cells/uL   Basophils Absolute 88 0 - 200  cells/uL   Neutrophils Relative % 55.7 %   Total Lymphocyte 32.4 %   Monocytes Relative 10.1 %   Eosinophils Relative 0.9 %   Basophils Relative 0.9 %  COMPLETE METABOLIC PANEL WITH GFR     Status: Abnormal   Collection Time: 06/28/17  8:29 AM  Result Value Ref Range   Glucose, Bld 109 (H) 65 - 99 mg/dL    Comment: .            Fasting reference interval . For someone  without known diabetes, a glucose value between 100 and 125 mg/dL is consistent with prediabetes and should be confirmed with a follow-up test. .    BUN 12 7 - 25 mg/dL   Creat 1.05 0.50 - 1.05 mg/dL    Comment: For patients >64 years of age, the reference limit for Creatinine is approximately 13% higher for people identified as African-American. .    GFR, Est Non African American 58 (L) > OR = 60 mL/min/1.57m2   GFR, Est African American 67 > OR = 60 mL/min/1.15m2   BUN/Creatinine Ratio NOT APPLICABLE 6 - 22 (calc)   Sodium 137 135 - 146 mmol/L   Potassium 4.3 3.5 - 5.3 mmol/L   Chloride 98 98 - 110 mmol/L   CO2 31 20 - 32 mmol/L   Calcium 9.1 8.6 - 10.4 mg/dL   Total Protein 7.4 6.1 - 8.1 g/dL   Albumin 3.6 3.6 - 5.1 g/dL   Globulin 3.8 (H) 1.9 - 3.7 g/dL (calc)   AG Ratio 0.9 (L) 1.0 - 2.5 (calc)   Total Bilirubin 0.3 0.2 - 1.2 mg/dL   Alkaline phosphatase (APISO) 59 33 - 130 U/L   AST 18 10 - 35 U/L   ALT 14 6 - 29 U/L  CBC with Differential/Platelet     Status: None   Collection Time: 06/28/17  8:29 AM  Result Value Ref Range   WBC 6.7 3.8 - 10.8 Thousand/uL   RBC 4.86 3.80 - 5.10 Million/uL   Hemoglobin 13.1 11.7 - 15.5 g/dL   HCT 39.2 35.0 - 45.0 %   MCV 80.7 80.0 - 100.0 fL   MCH 27.0 27.0 - 33.0 pg   MCHC 33.4 32.0 - 36.0 g/dL   RDW 14.1 11.0 - 15.0 %   Platelets 389 140 - 400 Thousand/uL   MPV 9.8 7.5 - 12.5 fL   Neutro Abs 3,203 1,500 - 7,800 cells/uL   Lymphs Abs 2,466 850 - 3,900 cells/uL   WBC mixed population 784 200 - 950 cells/uL   Eosinophils Absolute 168 15 - 500 cells/uL    Basophils Absolute 80 0 - 200 cells/uL   Neutrophils Relative % 47.8 %   Total Lymphocyte 36.8 %   Monocytes Relative 11.7 %   Eosinophils Relative 2.5 %   Basophils Relative 1.2 %     PHQ2/9: Depression screen [facility] 2/9 04/28/2017 10/31/2016 07/12/2016 04/29/2016 01/28/2016  Decreased Interest 0 0 0 0 0  Down, Depressed, Hopeless 1 0 0 1 0  PHQ - 2 Score 1 0 0 1 0     Fall Risk: Fall Risk  07/07/2017 04/28/2017 10/31/2016 07/12/2016 04/29/2016  Falls in the past year? No No Yes No No  Number falls in past yr: - - 1 - -  Injury with Fall? - - No - -     Assessment & Plan  1. Insulin resistance  - metFORMIN (GLUCOPHAGE) 500 MG tablet; Take 1 tablet (500 mg total) by mouth 2 (two) times daily with a meal.  Dispense: 180 tablet; Refill: 3 -hgbA1C  2. Metabolic syndrome  - metFORMIN (GLUCOPHAGE) 500 MG tablet; Take 1 tablet (500 mg total) by mouth 2 (two) times daily with a meal.  Dispense: 180 tablet; Refill: 3  3. Morbid obesity ([place])  Discussed with the patient the risk posed by an increased BMI. Discussed importance of portion control, calorie counting and at least 150 minutes of physical activity weekly. Avoid sweet beverages and drink more water. Eat at least 6 servings of  fruit and vegetables daily  Discussed all optiosns for weight loss medications including Belviq, Qsymia, Saxenda and Contrave. Discussed risk and benefits of each of them. She denies personal history of pancreatitis, or family history of thyroid cancer, we will try Metformin and if needed we can try GLP-1 agonist - metFORMIN (GLUCOPHAGE) 500 MG tablet; Take 1 tablet (500 mg total) by mouth 2 (two) times daily with a meal.  Dispense: 180 tablet; Refill: 3

## 2017-07-07 NOTE — Patient Instructions (Signed)
Diet for Metabolic Syndrome Metabolic syndrome is a disorder that includes at least three of these conditions:  Abdominal obesity.  Too much sugar in your blood.  High blood pressure.  Higher than normal amount of fat (lipids) in your blood.  Lower than normal level of "good" cholesterol (HDL).  Following a healthy diet can help to keep metabolic syndrome under control. It can also help to prevent the development of conditions that are associated with metabolic syndrome, such as diabetes, heart disease, and stroke. Along with exercise, a healthy diet:  Helps to improve the way that the body uses insulin.  Promotes weight loss. A common goal for people with this condition is to lose at least 7 to 10 percent of their starting weight.  What do I need to know about this diet?  Use the glycemic index (GI) to plan your meals. The index tells you how quickly a food will raise your blood sugar. Choose foods that have low GI values. These foods take a longer time to raise blood sugar.  Keep track of how many calories you take in. Eating the right amount of calories will help your achieve a healthy weight.  You may want to follow a Mediterranean diet. This diet includes lots of vegetables, lean meats or fish, whole grains, fruits, and healthy oils and fats. What foods can I eat? Grains Stone-ground whole wheat. Pumpernickel bread. Whole-grain bread, crackers, tortillas, cereal, and pasta. Unsweetened oatmeal.Bulgur.Barley.Quinoa.Brown rice or wild rice. Vegetables Lettuce. Spinach. Peas. Beets. Cauliflower. Cabbage. Broccoli. Carrots. Tomatoes. Squash. Eggplant. Herbs. Peppers. Onions. Cucumbers. Brussels sprouts. Sweet potatoes. Yams. Beans. Lentils. Fruits Berries. Apples. Oranges. Grapes. Mango. Pomegranate. Kiwi. Cherries. Meats and Other Protein Sources Seafood and shellfish. Lean meats.Poultry. Tofu. Dairy Low-fat or fat-free dairy products, such as milk, yogurt, and  cheese. Beverages Water. Low-fat milk. Milk alternatives, like soy milk or almond milk. Real fruit juice. Condiments Low-sugar or sugar-free ketchup, barbecue sauce, and mayonnaise. Mustard. Relish. Fats and Oils Avocado. Canola or olive oil. Nuts and nut butters.Seeds. The items listed above may not be a complete list of recommended foods or beverages. Contact your dietitian for more options. What foods are not recommended? Red meat. Palm oil and coconut oil. Processed foods. Fried foods. Alcohol. Sweetened drinks, such as iced tea and soda. Sweets. Salty foods. The items listed above may not be a complete list of foods and beverages to avoid. Contact your dietitian for more information. This information is not intended to replace advice given to you by your health care provider. Make sure you discuss any questions you have with your health care provider. Document Released: 01/06/2015 Document Revised: 01/01/2016 Document Reviewed: 09/03/2014 Elsevier Interactive Patient Education  2018 [employer].

## 2017-07-11 ENCOUNTER — Ambulatory Visit
Admission: RE | Admit: 2017-07-11 | Discharge: 2017-07-11 | Disposition: A | Discharge status: Home / Self Care | Payer: MEDICARE

## 2017-07-11 DIAGNOSIS — R918 Other nonspecific abnormal finding of lung field: Secondary | ICD-10-CM | POA: Diagnosis not present

## 2017-07-11 DIAGNOSIS — J8489 Other specified interstitial pulmonary diseases: Principal | ICD-10-CM

## 2017-07-20 ENCOUNTER — Other Ambulatory Visit: Payer: Self-pay | Admitting: Family Medicine

## 2017-07-20 DIAGNOSIS — M26621 Arthralgia of right temporomandibular joint: Secondary | ICD-10-CM

## 2017-07-20 DIAGNOSIS — R110 Nausea: Secondary | ICD-10-CM

## 2017-07-20 DIAGNOSIS — R42 Dizziness and giddiness: Secondary | ICD-10-CM

## 2017-07-20 DIAGNOSIS — K219 Gastro-esophageal reflux disease without esophagitis: Secondary | ICD-10-CM

## 2017-07-20 DIAGNOSIS — I10 Essential (primary) hypertension: Secondary | ICD-10-CM

## 2017-07-20 DIAGNOSIS — S161XXA Strain of muscle, fascia and tendon at neck level, initial encounter: Secondary | ICD-10-CM

## 2017-07-24 MED ORDER — TOPIRAMATE 50 MG TABLET
ORAL_TABLET | 0 refills | 0 days | Status: CP
Start: 2017-07-24 — End: 2017-11-20

## 2017-07-24 MED ORDER — BUPROPION HCL XL 300 MG 24 HR TABLET, EXTENDED RELEASE
ORAL_TABLET | Freq: Every morning | ORAL | 0 refills | 0.00000 days | Status: CP
Start: 2017-07-24 — End: 2017-08-01

## 2017-07-30 ENCOUNTER — Other Ambulatory Visit: Payer: Self-pay | Admitting: Family Medicine

## 2017-07-30 DIAGNOSIS — I10 Essential (primary) hypertension: Secondary | ICD-10-CM

## 2017-07-30 DIAGNOSIS — N181 Chronic kidney disease, stage 1: Secondary | ICD-10-CM

## 2017-07-31 ENCOUNTER — Ambulatory Visit (INDEPENDENT_AMBULATORY_CARE_PROVIDER_SITE_OTHER): Payer: Medicare Other | Admitting: Family Medicine

## 2017-07-31 ENCOUNTER — Encounter: Payer: Self-pay | Admitting: Family Medicine

## 2017-07-31 VITALS — BP 134/92 | HR 102 | Temp 98.7°F | Resp 18 | Ht 68.0 in | Wt 266.3 lb

## 2017-07-31 DIAGNOSIS — K5909 Other constipation: Secondary | ICD-10-CM | POA: Diagnosis not present

## 2017-07-31 DIAGNOSIS — E8881 Metabolic syndrome: Secondary | ICD-10-CM | POA: Diagnosis not present

## 2017-07-31 DIAGNOSIS — E559 Vitamin D deficiency, unspecified: Secondary | ICD-10-CM | POA: Diagnosis not present

## 2017-07-31 DIAGNOSIS — I10 Essential (primary) hypertension: Secondary | ICD-10-CM | POA: Diagnosis not present

## 2017-07-31 DIAGNOSIS — R110 Nausea: Secondary | ICD-10-CM

## 2017-07-31 DIAGNOSIS — Z79899 Other long term (current) drug therapy: Secondary | ICD-10-CM | POA: Diagnosis not present

## 2017-07-31 DIAGNOSIS — E785 Hyperlipidemia, unspecified: Secondary | ICD-10-CM | POA: Diagnosis not present

## 2017-07-31 LAB — COMPLETE METABOLIC PANEL WITH GFR
AG RATIO: 1 (calc) (ref 1.0–2.5)
ALKALINE PHOSPHATASE (APISO): 57 U/L (ref 33–130)
ALT: 16 U/L (ref 6–29)
AST: 19 U/L (ref 10–35)
Albumin: 3.7 g/dL (ref 3.6–5.1)
BILIRUBIN TOTAL: 0.3 mg/dL (ref 0.2–1.2)
BUN: 9 mg/dL (ref 7–25)
CHLORIDE: 102 mmol/L (ref 98–110)
CO2: 34 mmol/L — ABNORMAL HIGH (ref 20–32)
Calcium: 9.2 mg/dL (ref 8.6–10.4)
Creat: 0.99 mg/dL (ref 0.50–1.05)
GFR, EST NON AFRICAN AMERICAN: 62 mL/min/{1.73_m2} (ref >=60)
GFR, Est African American: 72 mL/min/{1.73_m2} (ref >=60)
GLOBULIN: 3.8 g/dL — AB (ref 1.9–3.7)
Glucose, Bld: 77 mg/dL (ref 65–99)
POTASSIUM: 4.3 mmol/L (ref 3.5–5.3)
SODIUM: 141 mmol/L (ref 135–146)
Total Protein: 7.5 g/dL (ref 6.1–8.1)

## 2017-07-31 LAB — CBC WITH DIFFERENTIAL/PLATELET
BASOS ABS: 90 {cells}/uL (ref 0–200)
Basophils Relative: 1.5 %
Eosinophils Absolute: 102 cells/uL (ref 15–500)
Eosinophils Relative: 1.7 %
HEMATOCRIT: 41.1 % (ref 35.0–45.0)
Hemoglobin: 13.7 g/dL (ref 11.7–15.5)
LYMPHS ABS: 2178 {cells}/uL (ref 850–3900)
MCH: 26.9 pg — ABNORMAL LOW (ref 27.0–33.0)
MCHC: 33.3 g/dL (ref 32.0–36.0)
MCV: 80.7 fL (ref 80.0–100.0)
MPV: 9.7 fL (ref 7.5–12.5)
Monocytes Relative: 12.3 %
NEUTROS PCT: 48.2 %
Neutro Abs: 2892 cells/uL (ref 1500–7800)
PLATELETS: 371 10*3/uL (ref 140–400)
RBC: 5.09 10*6/uL (ref 3.80–5.10)
RDW: 14.6 % (ref 11.0–15.0)
TOTAL LYMPHOCYTE: 36.3 %
WBC: 6 10*3/uL (ref 3.8–10.8)
WBCMIX: 738 {cells}/uL (ref 200–950)

## 2017-07-31 MED ORDER — POLYETHYLENE GLYCOL 3350 17 GM/SCOOP PO POWD: ORAL | 0 refills | Status: DC

## 2017-07-31 MED ORDER — LAMOTRIGINE 200 MG PO TABS: 200.0000 mg | ORAL_TABLET | Freq: Two times a day (BID) | ORAL | 0 refills | Status: DC

## 2017-07-31 NOTE — Progress Notes (Signed)
Name: Nicole Ballard   MRN: [id number]    DOB: [birth year]   Date:07/31/2017       Progress Note  Subjective  Chief Complaint  Chief Complaint  Patient presents with  . Medication Refill  . Constipation    Patient has been very nausea and patient has to give herself a suppositories to go the bathroom. Or she will not go at and once she takes the suppositories it will only be a little bit.  . Metabolic Syndrome  . Hypertension    HPI  Metabolic syndrome/obesity: she is very frustrated about weight gain, she is taking high dose prednisone for almost one year and keeps gaining weight. She also has  Metabolic syndrome : increase in abdominal girth, low HDL, high glucose and hgbA1C , she is on Metformin and denies side effects. Still has pruritus in both eyes and will see ophthalmologist this week.   Lupus: sees Rheumatologist at [facility], she was diagnosed in 2006, she had episcleritis, arthralgia and positive ANA and ds DNA. 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 She states she still has a mild cough, she is feeling better on medication.   Obesity: she has not been able to eat because of nausea, and is also taking metformin, lost 6 lbs.  GAD and Major Depression: she sees Psychiatrist and therapist at [facility], taking medication, no suicidal thoughts or ideation. She has fatigue and at times anhedonia.  HTN: bp is elevated today, no chest pain,she is taking half pill of losartan hctz, however bp usually at goal. She is having nausea from worsening of constipation. We will monitor for now  Hyperlipidemia: on lifestyle modification only, reviewed labs  Chronic constipation: she is on Miralax , finished PT for bowel training ( ordered by Dr. Erskine Speed),  constipation is getting worse and nausea is unbearable, explained importance of seeing Dr. Dereck Ligas and getting refills of anti-emetics from him.    Patient Active Problem List   Diagnosis Date Noted  . Vertigo 09/29/2016  . Bronchiolitis obliterans organizing pneumonia ([place]) 09/29/2016  . Neck pain, musculoskeletal 12/15/2015  . Anxiety about health 09/29/2015  . Abnormal CAT scan 04/11/2015  . Auditory impairment 04/11/2015  . Insomnia, persistent 04/11/2015  . Chronic kidney disease (CKD), stage I 04/11/2015  . Chronic nonmalignant pain 04/11/2015  . Chronic constipation 04/11/2015  . Dyslipidemia 04/11/2015  . Fibromyalgia 04/11/2015  . Gastro-esophageal reflux disease without esophagitis 04/11/2015  . Dysphonia 04/11/2015  . Absence of bladder continence 04/11/2015  . Low back pain 04/11/2015  . Eczema intertrigo 04/11/2015  . Keratosis pilaris 04/11/2015  . Chronic recurrent major depressive disorder ([place]) 04/11/2015  . Dysmetabolic syndrome 76/19/5093  . Neurogenic claudication 04/11/2015  . Perennial allergic rhinitis with seasonal variation 04/11/2015  . Abnormal presence of protein in urine 04/11/2015  . Seborrhea capitis 04/11/2015  . Moderate dysplasia of vulva 04/11/2015  . Vitamin D deficiency 04/11/2015  . Treadmill stress test negative for angina pectoris 03/05/2015  . Shortness of breath 05/20/2014  . Morbid obesity ([place]) 02/03/2014  . Asthma, chronic 12/31/2013  . H/O total knee replacement 12/31/2013  . Episcleritis 09/26/2013  . Vocal cord dysfunction 05/28/2013  . Anxiety, generalized 03/19/2013  . Back pain 12/18/2012  . Pulmonary nodules 11/26/2012  . Lung nodule, multiple  11/26/2012  . Arthritis, degenerative 11/01/2012  . H/O aspiration pneumonitis 10/22/2012  . Postinflammatory pulmonary fibrosis ([place]) 10/22/2012  . Mixed incontinence 05/04/2012  . Benign neoplasm of pituitary gland and craniopharyngeal duct ([place]) 12/29/2010  . Lung  involvement in systemic lupus erythematosus ([place]) 11/05/2010  . Chronic nausea 07/01/2008  . Benign hypertension 01/25/2005    Past Surgical History:  Procedure Laterality Date  . BRAIN SURGERY    . CHOLECYSTECTOMY    . VAGINAL HYSTERECTOMY      Family History  Problem Relation Age of Onset  . Breast cancer Mother   . Cancer Mother 25       Breast  . Cancer Father 20       Stomach  . Breast cancer Maternal Aunt     Social History   Socioeconomic History  . Marital status: Divorced    Spouse name: Not on file  . Number of children: Not on file  . Years of education: Not on file  . Highest education level: Not on file  Social Needs  . Financial resource strain: Not on file  . Food insecurity - worry: Not on file  . Food insecurity - inability: Not on file  . Transportation needs - medical: Not on file  . Transportation needs - non-medical: Not on file  Occupational History  . Not on file  Tobacco Use  . Smoking status: Never Smoker  . Smokeless tobacco: Never Used  Substance and Sexual Activity  . Alcohol use: No    Alcohol/week: 0.0 oz  . Drug use: No  . Sexual activity: Not Currently  Other Topics Concern  . Not on file  Social History Narrative  . Not on file     Current Outpatient Medications:  .  albuterol (PROAIR HFA) 108 (90 BASE) MCG/ACT inhaler, Inhale 2 puffs into the lungs every 6 (six) hours as needed for wheezing. DX code R06.02, Disp: 3 Inhaler, Rfl: 3 .  azelastine (OPTIVAR) 0.05 % ophthalmic solution, Place 1 drop into both eyes 2 (two) times daily., Disp: 6 mL, Rfl: 1 .  buPROPion (WELLBUTRIN XL) 300 MG 24 hr tablet, Take 1 tablet by mouth daily., Disp: , Rfl:  .  clonazePAM (KLONOPIN) 0.5 MG tablet, Take 0.5 tablets (0.25 mg total) by mouth daily., Disp: 15 tablet, Rfl: 0 .  cyclobenzaprine (FLEXERIL) 5 MG tablet, TAKE 1 TABLET BY MOUTH  EVERY 8 HOURS AS NEEDED FOR MUSCLE SPASMS. DO NOT DRIVE FOR 8 HOURS, Disp: 90 tablet, Rfl: 1 .   dicyclomine (BENTYL) 20 MG tablet, Take 1 tablet (20 mg total) by mouth every 6 (six) hours as needed (Abdominal Cramping/Pain)., Disp: 15 tablet, Rfl: 0 .  DULoxetine (CYMBALTA) 60 MG capsule, Take 120 mg by mouth at bedtime. , Disp: , Rfl:  .  FLOVENT HFA 110 MCG/ACT inhaler, INHALE 2 PUFFS PO BID, Disp: , Rfl: 0 .  fluticasone (FLONASE) 50 MCG/ACT nasal spray, USE 2 SPRAYS IN EACH  NOSTRIL DAILY, Disp: 48 g, Rfl: 1 .  gabapentin (NEURONTIN) 300 MG capsule, Take 1 capsule by mouth 3 (three) times daily., Disp: , Rfl:  .  gentamicin (GARAMYCIN) 0.3 % ophthalmic solution, Place 2 drops into both eyes 4 (four) times daily., Disp: 5 mL, Rfl: 0 .  hydroxychloroquine (PLAQUENIL) 200 MG tablet, Take 200 mg by mouth 2 (two) times daily., Disp: , Rfl:  .  losartan-hydrochlorothiazide (HYZAAR) 50-12.5 MG tablet, TAKE ONE-HALF TABLET BY  MOUTH DAILY, Disp: 45 tablet, Rfl: 0 .  meclizine (ANTIVERT)  25 MG tablet, TAKE 1 TABLET BY MOUTH 3  TIMES DAILY AS NEEDED, Disp: 30 tablet, Rfl: 0 .  metFORMIN (GLUCOPHAGE) 500 MG tablet, Take 1 tablet (500 mg total) by mouth 2 (two) times daily with a meal., Disp: 180 tablet, Rfl: 3 .  Multiple Vitamin (MULTIVITAMIN) tablet, Take 1 tablet by mouth daily., Disp: , Rfl:  .  mycophenolate (CELLCEPT) 500 MG tablet, Take 2 tablets (1,000 mg total) by mouth 2 (two) times daily., Disp: 5 tablet, Rfl: 0 .  omeprazole (PRILOSEC) 40 MG capsule, TAKE 1 CAPSULE BY MOUTH  DAILY, Disp: 90 capsule, Rfl: 1 .  ondansetron (ZOFRAN) 4 MG tablet, TAKE 1 TABLET BY MOUTH  EVERY 8 HOURS AS NEEDED FOR NAUSEA AND VOMITING, Disp: 60 tablet, Rfl: 0 .  polyethylene glycol powder (GLYCOLAX/MIRALAX) powder, 2 cap fulls in a full glass of water, three times a day, for 5 days., Disp: 850 g, Rfl: 0 .  predniSONE (DELTASONE) 10 MG tablet, Take 15 mg by mouth daily., Disp: , Rfl:  .  topiramate (TOPAMAX) 50 MG tablet, Take 1 tablet by mouth 3 (three) times daily. Dr. [place] James - Pain management, Disp: ,  Rfl:  .  traZODone (DESYREL) 100 MG tablet, Take 1 tablet by mouth at bedtime., Disp: , Rfl:  .  triamcinolone ointment (KENALOG) 0.1 %, , Disp: , Rfl:  .  YUVAFEM 10 MCG TABS vaginal tablet, Place 1 tablet vaginally once a week., Disp: , Rfl:  .  Cholecalciferol (VITAMIN D) 2000 UNITS CAPS, Take 1 capsule by mouth daily., Disp: , Rfl:  .  diclofenac sodium (VOLTAREN) 1 % GEL, APP 2 GRAMS EXT AA QID, Disp: , Rfl: 6 .  lamoTRIgine (LAMICTAL) 100 MG tablet, Take 1 tablet by mouth 3 (three) times daily., Disp: , Rfl:  .  lamoTRIgine (LAMICTAL) 200 MG tablet, , Disp: , Rfl:   Allergies  Allergen Reactions  . Ace Inhibitors     Other reaction(s): OTHER Pt states she can not take ace inhibitors. Pt states she can not remember her reaction.   Marland Kitchen Penicillins      ROS  Constitutional: Negative for fever or weight change.  Respiratory: Positive  for cough and shortness of breath ( chronic) .   Cardiovascular: Negative for chest pain or palpitations.  Gastrointestinal: Negative for abdominal pain, no bowel changes ( chronic constipation) .  Musculoskeletal: Negative for gait problem or joint swelling.  Skin: Negative for rash.  Neurological: Negative for dizziness or headache.  No other specific complaints in a complete review of systems (except as listed in HPI above).  Objective  Vitals:   07/31/17 0931  BP: (!) 134/92  Pulse: (!) 102  Resp: 18  Temp: 98.7 F (37.1 C)  TempSrc: Oral  SpO2: 96%  Weight: 266 lb 4.8 oz (120.8 kg)  Height: 5\' 8"  (1.727 m)    Body mass index is 40.49 kg/m.  Physical Exam  Constitutional: Patient appears well-developed and well-nourished. Obese No distress.  HEENT: head atraumatic, normocephalic, pupils equal and reactive to light,  neck supple, throat within normal limits Cardiovascular: Normal rate, regular rhythm and normal heart sounds.  No murmur heard. No BLE edema. Pulmonary/Chest: Effort normal and breath sounds normal. No respiratory  distress. Abdominal: Soft.  There is no tenderness. Psychiatric: Patient has a normal mood and affect. behavior is normal. Judgment and thought content normal.  Recent Results (from the past 2160 hour(s))  TSH     Status: None   Collection Time: 05/17/17  3:05 PM  Result Value Ref Range   TSH 0.73 0.40 - 4.50 mIU/L  CBC with Differential/Platelet     Status: Abnormal   Collection Time: 05/29/17 10:12 AM  Result Value Ref Range   WBC 8.0 3.8 - 10.8 Thousand/uL   RBC 4.98 3.80 - 5.10 Million/uL   Hemoglobin 13.4 11.7 - 15.5 g/dL   HCT 39.9 35.0 - 45.0 %   MCV 80.1 80.0 - 100.0 fL   MCH 26.9 (L) 27.0 - 33.0 pg   MCHC 33.6 32.0 - 36.0 g/dL   RDW 14.6 11.0 - 15.0 %   Platelets 384 140 - 400 Thousand/uL   MPV 9.7 7.5 - 12.5 fL   Neutro Abs 4,632 1,500 - 7,800 cells/uL   Lymphs Abs 2,496 850 - 3,900 cells/uL   WBC mixed population 760 200 - 950 cells/uL   Eosinophils Absolute 56 15 - 500 cells/uL   Basophils Absolute 56 0 - 200 cells/uL   Neutrophils Relative % 57.9 %   Total Lymphocyte 31.2 %   Monocytes Relative 9.5 %   Eosinophils Relative 0.7 %   Basophils Relative 0.7 %  COMPLETE METABOLIC PANEL WITH GFR     Status: None   Collection Time: 05/29/17 10:12 AM  Result Value Ref Range   Glucose, Bld 84 65 - 99 mg/dL    Comment: .            Fasting reference interval .    BUN 10 7 - 25 mg/dL   Creat 0.90 0.50 - 1.05 mg/dL    Comment: For patients >70 years of age, the reference limit for Creatinine is approximately 13% higher for people identified as African-American. .    GFR, Est Non African American 70 > OR = 60 mL/min/1.52m2   GFR, Est African American 81 > OR = 60 mL/min/1.17m2   BUN/Creatinine Ratio NOT APPLICABLE 6 - 22 (calc)   Sodium 140 135 - 146 mmol/L   Potassium 4.1 3.5 - 5.3 mmol/L   Chloride 103 98 - 110 mmol/L   CO2 31 20 - 32 mmol/L   Calcium 9.1 8.6 - 10.4 mg/dL   Total Protein 7.2 6.1 - 8.1 g/dL   Albumin 3.6 3.6 - 5.1 g/dL   Globulin 3.6 1.9 -  3.7 g/dL (calc)   AG Ratio 1.0 1.0 - 2.5 (calc)   Total Bilirubin 0.3 0.2 - 1.2 mg/dL   Alkaline phosphatase (APISO) 64 33 - 130 U/L   AST 18 10 - 35 U/L   ALT 15 6 - 29 U/L  COMPLETE METABOLIC PANEL WITH GFR     Status: Abnormal   Collection Time: 06/12/17 10:55 AM  Result Value Ref Range   Glucose, Bld 67 65 - 99 mg/dL    Comment: .            Fasting reference interval .    BUN 12 7 - 25 mg/dL   Creat 0.91 0.50 - 1.05 mg/dL    Comment: For patients >79 years of age, the reference limit for Creatinine is approximately 13% higher for people identified as African-American. .    GFR, Est Non African American 69 > OR = 60 mL/min/1.62m2   GFR, Est African American 80 > OR = 60 mL/min/1.58m2   BUN/Creatinine Ratio NOT APPLICABLE 6 - 22 (calc)   Sodium 141 135 - 146 mmol/L   Potassium 4.5 3.5 - 5.3 mmol/L   Chloride 101 98 - 110 mmol/L   CO2 34 (H) 20 - 32  mmol/L   Calcium 9.2 8.6 - 10.4 mg/dL   Total Protein 7.2 6.1 - 8.1 g/dL   Albumin 3.4 (L) 3.6 - 5.1 g/dL   Globulin 3.8 (H) 1.9 - 3.7 g/dL (calc)   AG Ratio 0.9 (L) 1.0 - 2.5 (calc)   Total Bilirubin 0.3 0.2 - 1.2 mg/dL   Alkaline phosphatase (APISO) 57 33 - 130 U/L   AST 14 10 - 35 U/L   ALT 13 6 - 29 U/L  CBC with Differential/Platelet     Status: Abnormal   Collection Time: 06/12/17 10:55 AM  Result Value Ref Range   WBC 9.8 3.8 - 10.8 Thousand/uL   RBC 4.89 3.80 - 5.10 Million/uL   Hemoglobin 12.9 11.7 - 15.5 g/dL   HCT 39.9 35.0 - 45.0 %   MCV 81.6 80.0 - 100.0 fL   MCH 26.4 (L) 27.0 - 33.0 pg   MCHC 32.3 32.0 - 36.0 g/dL   RDW 14.3 11.0 - 15.0 %   Platelets 387 140 - 400 Thousand/uL   MPV 9.7 7.5 - 12.5 fL   Neutro Abs 5,459 1,500 - 7,800 cells/uL   Lymphs Abs 3,175 850 - 3,900 cells/uL   WBC mixed population 990 (H) 200 - 950 cells/uL   Eosinophils Absolute 88 15 - 500 cells/uL   Basophils Absolute 88 0 - 200 cells/uL   Neutrophils Relative % 55.7 %   Total Lymphocyte 32.4 %   Monocytes Relative 10.1 %    Eosinophils Relative 0.9 %   Basophils Relative 0.9 %  COMPLETE METABOLIC PANEL WITH GFR     Status: Abnormal   Collection Time: 06/28/17  8:29 AM  Result Value Ref Range   Glucose, Bld 109 (H) 65 - 99 mg/dL    Comment: .            Fasting reference interval . For someone without known diabetes, a glucose value between 100 and 125 mg/dL is consistent with prediabetes and should be confirmed with a follow-up test. .    BUN 12 7 - 25 mg/dL   Creat 1.05 0.50 - 1.05 mg/dL    Comment: For patients >47 years of age, the reference limit for Creatinine is approximately 13% higher for people identified as African-American. .    GFR, Est Non African American 58 (L) > OR = 60 mL/min/1.59m2   GFR, Est African American 67 > OR = 60 mL/min/1.31m2   BUN/Creatinine Ratio NOT APPLICABLE 6 - 22 (calc)   Sodium 137 135 - 146 mmol/L   Potassium 4.3 3.5 - 5.3 mmol/L   Chloride 98 98 - 110 mmol/L   CO2 31 20 - 32 mmol/L   Calcium 9.1 8.6 - 10.4 mg/dL   Total Protein 7.4 6.1 - 8.1 g/dL   Albumin 3.6 3.6 - 5.1 g/dL   Globulin 3.8 (H) 1.9 - 3.7 g/dL (calc)   AG Ratio 0.9 (L) 1.0 - 2.5 (calc)   Total Bilirubin 0.3 0.2 - 1.2 mg/dL   Alkaline phosphatase (APISO) 59 33 - 130 U/L   AST 18 10 - 35 U/L   ALT 14 6 - 29 U/L  CBC with Differential/Platelet     Status: None   Collection Time: 06/28/17  8:29 AM  Result Value Ref Range   WBC 6.7 3.8 - 10.8 Thousand/uL   RBC 4.86 3.80 - 5.10 Million/uL   Hemoglobin 13.1 11.7 - 15.5 g/dL   HCT 39.2 35.0 - 45.0 %   MCV 80.7 80.0 - 100.0 fL  MCH 27.0 27.0 - 33.0 pg   MCHC 33.4 32.0 - 36.0 g/dL   RDW 14.1 11.0 - 15.0 %   Platelets 389 140 - 400 Thousand/uL   MPV 9.8 7.5 - 12.5 fL   Neutro Abs 3,203 1,500 - 7,800 cells/uL   Lymphs Abs 2,466 850 - 3,900 cells/uL   WBC mixed population 784 200 - 950 cells/uL   Eosinophils Absolute 168 15 - 500 cells/uL   Basophils Absolute 80 0 - 200 cells/uL   Neutrophils Relative % 47.8 %   Total Lymphocyte 36.8 %    Monocytes Relative 11.7 %   Eosinophils Relative 2.5 %   Basophils Relative 1.2 %  POCT glycosylated hemoglobin (Hb A1C)     Status: None   Collection Time: 07/07/17 12:22 PM  Result Value Ref Range   Hemoglobin A1C 6.5      PHQ2/9: Depression screen [facility] 2/9 04/28/2017 10/31/2016 07/12/2016 04/29/2016 01/28/2016  Decreased Interest 0 0 0 0 0  Down, Depressed, Hopeless 1 0 0 1 0  PHQ - 2 Score 1 0 0 1 0     Fall Risk: Fall Risk  07/07/2017 04/28/2017 10/31/2016 07/12/2016 04/29/2016  Falls in the past year? No No Yes No No  Number falls in past yr: - - 1 - -  Injury with Fall? - - No - -     Assessment & Plan  1. Insulin resistance  - Hemoglobin A1c - Insulin, random  2. Morbid obesity ([place])  Discussed with the patient the risk posed by an increased BMI. Discussed importance of portion control, calorie counting and at least 150 minutes of physical activity weekly. Avoid sweet beverages and drink more water. Eat at least 6 servings of fruit and vegetables daily   3. Chronic constipation  - Ambulatory referral to Gastroenterology - polyethylene glycol powder (GLYCOLAX/MIRALAX) powder; 2 cap fulls in a full glass of water, three times a day, for 5 days.  Dispense: 850 g; Refill: 0  4. Chronic nausea  - Ambulatory referral to Gastroenterology  5. Vitamin D deficiency  Needs to take otc vitamin D 2000 units daily   6. Dyslipidemia  - CBC with Differential/Platelet - COMPLETE METABOLIC PANEL WITH GFR  7. Benign hypertension  - CBC with Differential/Platelet - COMPLETE METABOLIC PANEL WITH GFR  8. Long-term use of high-risk medication

## 2017-08-01 ENCOUNTER — Telehealth: Payer: Self-pay | Admitting: Family Medicine

## 2017-08-01 LAB — INSULIN, RANDOM: INSULIN: 9.8 u[IU]/mL (ref 2.0–19.6)

## 2017-08-01 MED ORDER — TRAZODONE 100 MG TABLET
ORAL_TABLET | Freq: Every evening | ORAL | 1 refills | 0 days | Status: CP
Start: 2017-08-01 — End: 2017-12-12

## 2017-08-01 MED ORDER — CLONAZEPAM 0.5 MG TABLET
ORAL_TABLET | Freq: Every evening | ORAL | 2 refills | 0 days | Status: CP
Start: 2017-08-01 — End: 2017-12-12

## 2017-08-01 MED ORDER — LAMOTRIGINE 200 MG TABLET
ORAL_TABLET | Freq: Two times a day (BID) | ORAL | 1 refills | 0 days | Status: CP
Start: 2017-08-01 — End: 2017-12-12

## 2017-08-01 MED ORDER — DULOXETINE 60 MG CAPSULE,DELAYED RELEASE
ORAL_CAPSULE | Freq: Every day | ORAL | 1 refills | 0 days | Status: CP
Start: 2017-08-01 — End: 2017-12-12

## 2017-08-01 MED ORDER — BUPROPION HCL XL 300 MG 24 HR TABLET, EXTENDED RELEASE
ORAL_TABLET | Freq: Every morning | ORAL | 1 refills | 0 days | Status: CP
Start: 2017-08-01 — End: 2017-12-12

## 2017-08-01 MED ORDER — BUPROPION HCL XL 150 MG 24 HR TABLET, EXTENDED RELEASE
ORAL_TABLET | Freq: Every day | ORAL | 1 refills | 0 days | Status: CP
Start: 2017-08-01 — End: 2017-12-12

## 2017-08-01 NOTE — Telephone Encounter (Signed)
Copied from [place]. Topic: Quick Communication - See Telephone Encounter >> Aug 01, 2017 11:12 AM Burnis Medin, NT wrote: CRM for notification. See Telephone encounter for: Pt is calling in to see if the doctor can fax over the stomach xray images to the GI doctor. Fax number is [phone]  08/01/17.

## 2017-08-03 ENCOUNTER — Telehealth: Payer: Self-pay | Admitting: Family Medicine

## 2017-08-03 MED ORDER — MYCOPHENOLATE MOFETIL 500 MG TABLET
ORAL_TABLET | 0 refills | 0 days | Status: CP
Start: 2017-08-03 — End: 2017-09-03

## 2017-08-03 NOTE — Telephone Encounter (Signed)
Copied from [place] [id number]. Topic: Quick Communication - See Telephone Encounter >> Aug 03, 2017  3:57 PM Aurelio Brash B wrote: CRM for notification. See Telephone encounter for:  PT has question about blood work results and would like Dr Ancil Boozer nurse to call her 08/03/17.

## 2017-08-04 ENCOUNTER — Ambulatory Visit: Admission: RE | Admit: 2017-08-04 | Discharge: 2017-08-04 | Payer: MEDICARE

## 2017-08-04 DIAGNOSIS — R739 Hyperglycemia, unspecified: Secondary | ICD-10-CM | POA: Diagnosis not present

## 2017-08-04 DIAGNOSIS — H04123 Dry eye syndrome of bilateral lacrimal glands: Secondary | ICD-10-CM | POA: Diagnosis not present

## 2017-08-04 DIAGNOSIS — Z79899 Other long term (current) drug therapy: Secondary | ICD-10-CM | POA: Diagnosis not present

## 2017-08-04 DIAGNOSIS — H04129 Dry eye syndrome of unspecified lacrimal gland: Principal | ICD-10-CM

## 2017-08-04 MED ORDER — OLOPATADINE 0.1 % EYE DROPS
Freq: Two times a day (BID) | OPHTHALMIC | 1 refills | 0 days | Status: CP
Start: 2017-08-04 — End: 2018-08-04

## 2017-08-04 MED ORDER — PREDNISOLONE ACETATE 1 % EYE DROPS,SUSPENSION
Freq: Every day | OPHTHALMIC | 0 refills | 0 days | Status: CP | PRN
Start: 2017-08-04 — End: 2017-12-14

## 2017-08-04 NOTE — Telephone Encounter (Signed)
Spoke with patient she was calling to confirm which lab was elevated, which was her Insulin test.

## 2017-08-06 ENCOUNTER — Emergency Department
Admission: EM | Admit: 2017-08-06 | Discharge: 2017-08-06 | Disposition: A | Discharge status: Home / Self Care | Payer: MEDICARE | Source: Intra-hospital | Attending: Family | Admitting: Family

## 2017-08-06 ENCOUNTER — Emergency Department
Admission: EM | Admit: 2017-08-06 | Discharge: 2017-08-06 | Disposition: A | Discharge status: Home / Self Care | Payer: MEDICARE | Source: Intra-hospital

## 2017-08-06 DIAGNOSIS — M329 Systemic lupus erythematosus, unspecified: Secondary | ICD-10-CM | POA: Diagnosis not present

## 2017-08-06 DIAGNOSIS — M1990 Unspecified osteoarthritis, unspecified site: Secondary | ICD-10-CM | POA: Diagnosis not present

## 2017-08-06 DIAGNOSIS — R1013 Epigastric pain: Secondary | ICD-10-CM | POA: Diagnosis not present

## 2017-08-06 DIAGNOSIS — M797 Fibromyalgia: Secondary | ICD-10-CM | POA: Diagnosis not present

## 2017-08-06 DIAGNOSIS — K5900 Constipation, unspecified: Secondary | ICD-10-CM | POA: Diagnosis not present

## 2017-08-06 DIAGNOSIS — R109 Unspecified abdominal pain: Secondary | ICD-10-CM | POA: Diagnosis not present

## 2017-08-06 DIAGNOSIS — I129 Hypertensive chronic kidney disease with stage 1 through stage 4 chronic kidney disease, or unspecified chronic kidney disease: Secondary | ICD-10-CM | POA: Diagnosis not present

## 2017-08-06 DIAGNOSIS — N181 Chronic kidney disease, stage 1: Secondary | ICD-10-CM | POA: Diagnosis not present

## 2017-08-06 DIAGNOSIS — R110 Nausea: Secondary | ICD-10-CM | POA: Diagnosis not present

## 2017-08-06 MED ORDER — PROMETHAZINE 25 MG TABLET
ORAL_TABLET | Freq: Four times a day (QID) | ORAL | 0 refills | 0.00000 days | Status: CP | PRN
Start: 2017-08-06 — End: 2017-12-12

## 2017-08-07 LAB — HEMOGLOBIN A1C
HEMOGLOBIN A1C: 6 %{Hb} — AB (ref <5.7)
MEAN PLASMA GLUCOSE: 126 (calc)
eAG (mmol/L): 7 (calc)

## 2017-08-07 LAB — INSULIN, RANDOM: Insulin: 9.2 u[IU]/mL (ref 2.0–19.6)

## 2017-08-10 ENCOUNTER — Ambulatory Visit
Admission: RE | Admit: 2017-08-10 | Discharge: 2017-08-10 | Disposition: A | Discharge status: Home / Self Care | Payer: MEDICARE | Admitting: Otolaryngology

## 2017-08-10 DIAGNOSIS — E079 Disorder of thyroid, unspecified: Secondary | ICD-10-CM | POA: Diagnosis not present

## 2017-08-19 ENCOUNTER — Telehealth: Payer: Self-pay

## 2017-08-19 NOTE — Telephone Encounter (Signed)
Copied from [place]. Topic: Referral - Request >> Aug 18, 2017 10:17 AM [place], Laban Emperor wrote: Reason for CRM: Pt is requesting a referral to have a MRI done on her stomach. Please follow up with the pt. She states she would like to have it done at [facility].

## 2017-08-21 ENCOUNTER — Emergency Department
Admission: EM | Admit: 2017-08-21 | Discharge: 2017-08-21 | Disposition: A | Discharge status: Home / Self Care | Payer: MEDICARE | Source: Intra-hospital | Attending: Family | Admitting: Family

## 2017-08-21 ENCOUNTER — Emergency Department
Admission: EM | Admit: 2017-08-21 | Discharge: 2017-08-21 | Disposition: A | Discharge status: Home / Self Care | Payer: MEDICARE | Source: Intra-hospital

## 2017-08-21 DIAGNOSIS — M329 Systemic lupus erythematosus, unspecified: Secondary | ICD-10-CM | POA: Diagnosis not present

## 2017-08-21 DIAGNOSIS — G894 Chronic pain syndrome: Secondary | ICD-10-CM | POA: Diagnosis not present

## 2017-08-21 DIAGNOSIS — M545 Low back pain: Secondary | ICD-10-CM | POA: Diagnosis not present

## 2017-08-21 DIAGNOSIS — K219 Gastro-esophageal reflux disease without esophagitis: Secondary | ICD-10-CM | POA: Diagnosis not present

## 2017-08-21 DIAGNOSIS — K5900 Constipation, unspecified: Secondary | ICD-10-CM | POA: Diagnosis not present

## 2017-08-21 DIAGNOSIS — E233 Hypothalamic dysfunction, not elsewhere classified: Secondary | ICD-10-CM | POA: Diagnosis not present

## 2017-08-21 DIAGNOSIS — R1013 Epigastric pain: Secondary | ICD-10-CM | POA: Diagnosis not present

## 2017-08-21 DIAGNOSIS — H905 Unspecified sensorineural hearing loss: Secondary | ICD-10-CM | POA: Diagnosis not present

## 2017-08-21 DIAGNOSIS — J45909 Unspecified asthma, uncomplicated: Secondary | ICD-10-CM | POA: Diagnosis not present

## 2017-08-21 DIAGNOSIS — N181 Chronic kidney disease, stage 1: Secondary | ICD-10-CM | POA: Diagnosis not present

## 2017-08-21 DIAGNOSIS — R110 Nausea: Secondary | ICD-10-CM | POA: Diagnosis not present

## 2017-08-21 DIAGNOSIS — M1990 Unspecified osteoarthritis, unspecified site: Secondary | ICD-10-CM | POA: Diagnosis not present

## 2017-08-21 DIAGNOSIS — I129 Hypertensive chronic kidney disease with stage 1 through stage 4 chronic kidney disease, or unspecified chronic kidney disease: Secondary | ICD-10-CM | POA: Diagnosis not present

## 2017-08-21 DIAGNOSIS — M797 Fibromyalgia: Secondary | ICD-10-CM | POA: Diagnosis not present

## 2017-08-21 DIAGNOSIS — Z79899 Other long term (current) drug therapy: Secondary | ICD-10-CM | POA: Diagnosis not present

## 2017-08-21 DIAGNOSIS — R109 Unspecified abdominal pain: Principal | ICD-10-CM

## 2017-08-21 NOTE — Telephone Encounter (Signed)
I sent referral to GI, can you clarify? I did not order MRI

## 2017-08-21 NOTE — Telephone Encounter (Signed)
I contacted this patient to see if she in fact did want a MRI of the abdomen and she said yes. Actually she is at the ER at [facility] since she was having intense stomach/ back pain and nausea.  I told her that I would inform Dr. Ancil Boozer and she said that she had already scheduled an appt with Dr. Ancil Boozer for this Wednesday.

## 2017-08-22 ENCOUNTER — Ambulatory Visit
Admission: RE | Admit: 2017-08-22 | Discharge: 2017-08-22 | Disposition: A | Discharge status: Home / Self Care | Payer: MEDICARE

## 2017-08-22 DIAGNOSIS — E079 Disorder of thyroid, unspecified: Secondary | ICD-10-CM | POA: Diagnosis not present

## 2017-08-23 ENCOUNTER — Telehealth: Payer: Self-pay | Admitting: Family Medicine

## 2017-08-23 NOTE — Telephone Encounter (Signed)
Copied from [place]. Topic: Referral - Request >> Aug 23, 2017  8:54 AM Burnis Medin, NT wrote: CRM for notification. See Telephone encounter for: Pt is calling to see if the doctor can give her a referral to get an ultra sound at [facility] in [place]. Pt said she is still having some nausea and pain. Pt would like a call back.  08/23/17.

## 2017-08-24 ENCOUNTER — Ambulatory Visit: Payer: Medicare Other | Admitting: Family Medicine

## 2017-08-24 NOTE — Telephone Encounter (Signed)
This is a chronic problem, she needs to call GI, I will not be able to do the testing that EC recommended.

## 2017-08-24 NOTE — Telephone Encounter (Signed)
Patient notified and states she will call GI.

## 2017-09-04 MED ORDER — HYDROCORTISONE 1 % TOPICAL CREAM
0 refills | 0 days | Status: CP
Start: 2017-09-04 — End: 2018-04-17

## 2017-09-04 MED ORDER — MYCOPHENOLATE MOFETIL 500 MG TABLET
ORAL_TABLET | 0 refills | 0 days | Status: CP
Start: 2017-09-04 — End: 2017-10-04

## 2017-09-06 ENCOUNTER — Telehealth: Payer: Self-pay

## 2017-09-06 DIAGNOSIS — Z79899 Other long term (current) drug therapy: Secondary | ICD-10-CM

## 2017-09-06 NOTE — Telephone Encounter (Signed)
Please sign standing orders for patient to have cbc done here every 6 weeks for 8 count.

## 2017-09-08 ENCOUNTER — Other Ambulatory Visit: Payer: Self-pay | Admitting: Family Medicine

## 2017-09-08 ENCOUNTER — Encounter: Admit: 2017-09-08 | Discharge: 2017-09-09 | Payer: MEDICARE | Attending: Gastroenterology | Primary: Gastroenterology

## 2017-09-08 DIAGNOSIS — K5909 Other constipation: Secondary | ICD-10-CM | POA: Diagnosis not present

## 2017-09-08 DIAGNOSIS — R110 Nausea: Secondary | ICD-10-CM | POA: Diagnosis not present

## 2017-09-08 DIAGNOSIS — I10 Essential (primary) hypertension: Secondary | ICD-10-CM

## 2017-09-11 ENCOUNTER — Other Ambulatory Visit: Payer: Self-pay | Admitting: Family Medicine

## 2017-09-11 DIAGNOSIS — M26621 Arthralgia of right temporomandibular joint: Secondary | ICD-10-CM

## 2017-09-11 DIAGNOSIS — R110 Nausea: Secondary | ICD-10-CM

## 2017-09-11 DIAGNOSIS — S161XXA Strain of muscle, fascia and tendon at neck level, initial encounter: Secondary | ICD-10-CM

## 2017-09-11 NOTE — Telephone Encounter (Signed)
Refill request for general medication: Zofran, Flonase, Flexeril to [facility].   Last office visit: 07/31/2017   Next visit: 10/31/2017

## 2017-09-12 ENCOUNTER — Encounter: Admit: 2017-09-12 | Discharge: 2017-09-13 | Payer: MEDICARE | Attending: Psychiatry | Primary: Psychiatry

## 2017-09-12 DIAGNOSIS — F329 Major depressive disorder, single episode, unspecified: Principal | ICD-10-CM

## 2017-09-12 DIAGNOSIS — R110 Nausea: Secondary | ICD-10-CM

## 2017-09-12 DIAGNOSIS — F411 Generalized anxiety disorder: Secondary | ICD-10-CM

## 2017-09-12 DIAGNOSIS — G4700 Insomnia, unspecified: Secondary | ICD-10-CM

## 2017-09-15 ENCOUNTER — Encounter: Admit: 2017-09-15 | Discharge: 2017-09-16 | Payer: MEDICARE | Attending: Psychologist | Primary: Psychologist

## 2017-09-15 DIAGNOSIS — M3213 Lung involvement in systemic lupus erythematosus: Secondary | ICD-10-CM

## 2017-09-15 DIAGNOSIS — F4310 Post-traumatic stress disorder, unspecified: Secondary | ICD-10-CM

## 2017-09-15 DIAGNOSIS — G894 Chronic pain syndrome: Secondary | ICD-10-CM

## 2017-09-15 DIAGNOSIS — F331 Major depressive disorder, recurrent, moderate: Secondary | ICD-10-CM

## 2017-09-15 DIAGNOSIS — F411 Generalized anxiety disorder: Principal | ICD-10-CM

## 2017-09-28 ENCOUNTER — Ambulatory Visit: Admit: 2017-09-28 | Discharge: 2017-09-28 | Payer: MEDICARE

## 2017-09-28 ENCOUNTER — Encounter: Admit: 2017-09-28 | Discharge: 2017-09-28 | Payer: MEDICARE

## 2017-09-28 DIAGNOSIS — J8489 Other specified interstitial pulmonary diseases: Secondary | ICD-10-CM | POA: Diagnosis not present

## 2017-09-28 DIAGNOSIS — M797 Fibromyalgia: Secondary | ICD-10-CM | POA: Diagnosis not present

## 2017-09-28 DIAGNOSIS — Z79899 Other long term (current) drug therapy: Secondary | ICD-10-CM | POA: Diagnosis not present

## 2017-09-28 DIAGNOSIS — J189 Pneumonia, unspecified organism: Secondary | ICD-10-CM | POA: Diagnosis not present

## 2017-09-28 DIAGNOSIS — N181 Chronic kidney disease, stage 1: Secondary | ICD-10-CM | POA: Diagnosis not present

## 2017-09-28 DIAGNOSIS — M1990 Unspecified osteoarthritis, unspecified site: Secondary | ICD-10-CM | POA: Diagnosis not present

## 2017-09-28 DIAGNOSIS — M150 Primary generalized (osteo)arthritis: Secondary | ICD-10-CM | POA: Diagnosis not present

## 2017-09-28 DIAGNOSIS — M329 Systemic lupus erythematosus, unspecified: Secondary | ICD-10-CM | POA: Diagnosis not present

## 2017-09-28 DIAGNOSIS — I129 Hypertensive chronic kidney disease with stage 1 through stage 4 chronic kidney disease, or unspecified chronic kidney disease: Secondary | ICD-10-CM | POA: Diagnosis not present

## 2017-09-28 MED ORDER — HYDROXYCHLOROQUINE 200 MG TABLET
ORAL_TABLET | Freq: Two times a day (BID) | ORAL | 2 refills | 0 days | Status: CP
Start: 2017-09-28 — End: 2018-01-31

## 2017-09-28 MED ORDER — DICLOFENAC 1 % TOPICAL GEL
Freq: Four times a day (QID) | TOPICAL | 5 refills | 0 days | Status: CP
Start: 2017-09-28 — End: 2017-11-20

## 2017-10-04 MED ORDER — MYCOPHENOLATE MOFETIL 500 MG TABLET
ORAL_TABLET | 0 refills | 0 days | Status: CP
Start: 2017-10-04 — End: 2017-12-28

## 2017-10-04 MED ORDER — CLINDAMYCIN HCL 300 MG CAPSULE
ORAL_CAPSULE | 1 refills | 0 days | Status: CP
Start: 2017-10-04 — End: 2018-04-13

## 2017-10-09 ENCOUNTER — Ambulatory Visit: Payer: Medicare Other | Admitting: Family Medicine

## 2017-10-16 ENCOUNTER — Encounter: Admit: 2017-10-16 | Discharge: 2017-10-16 | Payer: MEDICARE

## 2017-10-16 ENCOUNTER — Encounter
Admit: 2017-10-16 | Discharge: 2017-10-16 | Payer: MEDICARE | Attending: Critical Care Medicine | Primary: Critical Care Medicine

## 2017-10-16 DIAGNOSIS — Z803 Family history of malignant neoplasm of breast: Secondary | ICD-10-CM | POA: Diagnosis not present

## 2017-10-16 DIAGNOSIS — K219 Gastro-esophageal reflux disease without esophagitis: Secondary | ICD-10-CM | POA: Diagnosis not present

## 2017-10-16 DIAGNOSIS — J45909 Unspecified asthma, uncomplicated: Secondary | ICD-10-CM | POA: Diagnosis not present

## 2017-10-16 DIAGNOSIS — Z7951 Long term (current) use of inhaled steroids: Secondary | ICD-10-CM | POA: Diagnosis not present

## 2017-10-16 DIAGNOSIS — Z888 Allergy status to other drugs, medicaments and biological substances status: Secondary | ICD-10-CM | POA: Diagnosis not present

## 2017-10-16 DIAGNOSIS — I129 Hypertensive chronic kidney disease with stage 1 through stage 4 chronic kidney disease, or unspecified chronic kidney disease: Secondary | ICD-10-CM | POA: Diagnosis not present

## 2017-10-16 DIAGNOSIS — M329 Systemic lupus erythematosus, unspecified: Secondary | ICD-10-CM | POA: Diagnosis not present

## 2017-10-16 DIAGNOSIS — Z880 Allergy status to penicillin: Secondary | ICD-10-CM | POA: Diagnosis not present

## 2017-10-16 DIAGNOSIS — J8489 Other specified interstitial pulmonary diseases: Secondary | ICD-10-CM | POA: Diagnosis not present

## 2017-10-16 DIAGNOSIS — M797 Fibromyalgia: Secondary | ICD-10-CM | POA: Diagnosis not present

## 2017-10-16 DIAGNOSIS — N181 Chronic kidney disease, stage 1: Secondary | ICD-10-CM | POA: Diagnosis not present

## 2017-10-16 DIAGNOSIS — Z7952 Long term (current) use of systemic steroids: Secondary | ICD-10-CM | POA: Diagnosis not present

## 2017-10-16 DIAGNOSIS — Z79899 Other long term (current) drug therapy: Secondary | ICD-10-CM | POA: Diagnosis not present

## 2017-10-16 DIAGNOSIS — G894 Chronic pain syndrome: Secondary | ICD-10-CM | POA: Diagnosis not present

## 2017-10-16 MED ORDER — PREDNISONE 2.5 MG TABLET
ORAL_TABLET | Freq: Every day | ORAL | 0 refills | 0.00000 days | Status: CP
Start: 2017-10-16 — End: 2018-01-14

## 2017-10-18 ENCOUNTER — Other Ambulatory Visit: Payer: Self-pay | Admitting: Family Medicine

## 2017-10-18 DIAGNOSIS — R110 Nausea: Secondary | ICD-10-CM

## 2017-10-19 NOTE — Telephone Encounter (Signed)
Refill request for general medication. Meclizine and Zofran to [facility].   Last office visit: 07/31/2018   Follow up on 10/31/2017

## 2017-10-23 ENCOUNTER — Encounter: Payer: Self-pay | Admitting: Family Medicine

## 2017-10-23 DIAGNOSIS — R739 Hyperglycemia, unspecified: Secondary | ICD-10-CM | POA: Insufficient documentation

## 2017-10-25 ENCOUNTER — Other Ambulatory Visit: Payer: Self-pay

## 2017-10-26 MED ORDER — VALSARTAN-HYDROCHLOROTHIAZIDE 80-12.5 MG PO TABS: 1.0000 | ORAL_TABLET | Freq: Every day | ORAL | 1 refills | Status: DC

## 2017-10-30 MED ORDER — GABAPENTIN 300 MG CAPSULE
ORAL_CAPSULE | 1 refills | 0 days | Status: CP
Start: 2017-10-30 — End: 2017-11-20

## 2017-10-30 MED ORDER — RANITIDINE 150 MG TABLET
ORAL_TABLET | 1 refills | 0 days | Status: CP
Start: 2017-10-30 — End: 2018-05-11

## 2017-10-31 ENCOUNTER — Ambulatory Visit (INDEPENDENT_AMBULATORY_CARE_PROVIDER_SITE_OTHER): Payer: Medicare Other | Admitting: Family Medicine

## 2017-10-31 ENCOUNTER — Encounter: Payer: Self-pay | Admitting: Family Medicine

## 2017-10-31 VITALS — BP 130/80 | HR 85 | Resp 14 | Ht 68.0 in | Wt 256.4 lb

## 2017-10-31 DIAGNOSIS — E785 Hyperlipidemia, unspecified: Secondary | ICD-10-CM | POA: Diagnosis not present

## 2017-10-31 DIAGNOSIS — J8489 Other specified interstitial pulmonary diseases: Secondary | ICD-10-CM

## 2017-10-31 DIAGNOSIS — E669 Obesity, unspecified: Secondary | ICD-10-CM

## 2017-10-31 DIAGNOSIS — D353 Benign neoplasm of craniopharyngeal duct: Secondary | ICD-10-CM | POA: Diagnosis not present

## 2017-10-31 DIAGNOSIS — D352 Benign neoplasm of pituitary gland: Secondary | ICD-10-CM | POA: Diagnosis not present

## 2017-10-31 DIAGNOSIS — R110 Nausea: Secondary | ICD-10-CM

## 2017-10-31 DIAGNOSIS — F339 Major depressive disorder, recurrent, unspecified: Secondary | ICD-10-CM | POA: Diagnosis not present

## 2017-10-31 DIAGNOSIS — I10 Essential (primary) hypertension: Secondary | ICD-10-CM

## 2017-10-31 DIAGNOSIS — M3219 Other organ or system involvement in systemic lupus erythematosus: Secondary | ICD-10-CM | POA: Diagnosis not present

## 2017-10-31 DIAGNOSIS — E1169 Type 2 diabetes mellitus with other specified complication: Secondary | ICD-10-CM | POA: Diagnosis not present

## 2017-10-31 MED ORDER — METFORMIN HCL ER 500 MG PO TB24: 500.0000 mg | ORAL_TABLET | Freq: Every day | ORAL | 1 refills | Status: DC

## 2017-10-31 MED ORDER — ASPIRIN EC 81 MG PO TBEC: 81.0000 mg | DELAYED_RELEASE_TABLET | Freq: Every day | ORAL | 0 refills | Status: AC

## 2017-10-31 NOTE — Patient Instructions (Signed)

## 2017-10-31 NOTE — Progress Notes (Signed)
Name: Nicole Ballard   MRN: [id number]    DOB: 1956-08-31   Date:10/31/2017       Progress Note  Subjective  Chief Complaint  Chief Complaint  Patient presents with  . Hypertension  . Hyperlipidemia    HPI  Diabetes type II: diagnosed 07/07/2017 by a hgbA1C 6.5, she has already been taking Metformin for pre-diabetes/metabolic syndrome. She has a history of chronic nausea and we will try to change from immediate release to Metformin ER once daily. Discussed referral to dietician- she states referral was made but not covered by insurance.  She denies polyphagia, polyuria or polydipsia. She is up to date with eye exam, but advised her to discuss diagnosis with ophthalmologist. Discussed life style modification, she is on statin therapy , ARB and aspirin.   HTN: [employer] sent a noticed stating Losartan/hctz recalled, sent Valsartan hctz to pharmacy and she is waiting for it to be shipped to her. BP upon arrival was very high, but back to normal with rest. She has intermittent chest burning - from reflux, always has SOB.  Chronic nausea: she states symptoms improved once she went down on dose of Cellcept - currently only on 1000 mg daily and is tolerating it much better. Stills Zofran prn.   Organizing pneumonia: seeing pulmonologist at [facility], and is down on dose of Cellcept because of side effects, nausea was severe. States SOB has been stable, also has wheezing, but mild and intermittent and has not used albuterol lately. No fever or chills. Appetite is normal  SLE: going to [facility], still on prednisone, down to 7.5 mg daily, she has daily joint pain, no effusion, also has fatigue.   Chronic Depression: going to [facility] and seeing therapist also, feeling more cheerful today, she denies any suicidal thoughts or ideation. She states enjoying her new therapist. Taking medication as prescribed.   Pituitary adenoma: stable, denies diplopia. No nipple discharge.   Obesity: she lost 10 lbs since last visit,  she is not sure what has changed, may have been from going down on prednisone dose  Patient Active Problem List   Diagnosis Date Noted  . Hyperglycemia 10/23/2017  . Vertigo 09/29/2016  . Bronchiolitis obliterans organizing pneumonia ([place]) 09/29/2016  . Neck pain, musculoskeletal 12/15/2015  . Anxiety about health 09/29/2015  . Abnormal CAT scan 04/11/2015  . Auditory impairment 04/11/2015  . Insomnia, persistent 04/11/2015  . Chronic kidney disease (CKD), stage I 04/11/2015  . Chronic nonmalignant pain 04/11/2015  . Chronic constipation 04/11/2015  . Dyslipidemia 04/11/2015  . Fibromyalgia 04/11/2015  . Gastro-esophageal reflux disease without esophagitis 04/11/2015  . Dysphonia 04/11/2015  . Absence of bladder continence 04/11/2015  . Low back pain 04/11/2015  . Eczema intertrigo 04/11/2015  . Keratosis pilaris 04/11/2015  . Chronic recurrent major depressive disorder ([place]) 04/11/2015  . Dysmetabolic syndrome 12/45/8099  . Neurogenic claudication 04/11/2015  . Perennial allergic rhinitis with seasonal variation 04/11/2015  . Abnormal presence of protein in urine 04/11/2015  . Seborrhea capitis 04/11/2015  . Moderate dysplasia of vulva 04/11/2015  . Vitamin D deficiency 04/11/2015  . Treadmill stress test negative for angina pectoris 03/05/2015  . Shortness of breath 05/20/2014  . Morbid obesity ([place]) 02/03/2014  . Asthma, chronic 12/31/2013  . H/O total knee replacement 12/31/2013  . Episcleritis 09/26/2013  . Vocal cord dysfunction 05/28/2013  . Anxiety, generalized 03/19/2013  . Back pain 12/18/2012  . Pulmonary nodules 11/26/2012  . Lung nodule, multiple 11/26/2012  . Arthritis, degenerative 11/01/2012  . H/O  aspiration pneumonitis 10/22/2012  . Postinflammatory pulmonary fibrosis ([place]) 10/22/2012  . Mixed incontinence 05/04/2012  . Benign neoplasm of pituitary gland and craniopharyngeal duct ([place]) 12/29/2010  . Lung involvement in systemic lupus erythematosus  ([place]) 11/05/2010  . Chronic nausea 07/01/2008  . Benign hypertension 01/25/2005    Past Surgical History:  Procedure Laterality Date  . BRAIN SURGERY    . CHOLECYSTECTOMY    . VAGINAL HYSTERECTOMY      Family History  Problem Relation Age of Onset  . Breast cancer Mother   . Cancer Mother 62       Breast  . Cancer Father 87       Stomach  . Breast cancer Maternal Aunt     Social History   Socioeconomic History  . Marital status: Divorced    Spouse name: Not on file  . Number of children: Not on file  . Years of education: Not on file  . Highest education level: Not on file  Social Needs  . Financial resource strain: Not on file  . Food insecurity - worry: Not on file  . Food insecurity - inability: Not on file  . Transportation needs - medical: Not on file  . Transportation needs - non-medical: Not on file  Occupational History  . Not on file  Tobacco Use  . Smoking status: Never Smoker  . Smokeless tobacco: Never Used  Substance and Sexual Activity  . Alcohol use: No    Alcohol/week: 0.0 oz  . Drug use: No  . Sexual activity: Not Currently  Other Topics Concern  . Not on file  Social History Narrative  . Not on file     Current Outpatient Medications:  .  gabapentin (NEURONTIN) 300 MG capsule, Take 1 capsule by mouth 3 (three) times daily., Disp: , Rfl:  .  albuterol (PROAIR HFA) 108 (90 BASE) MCG/ACT inhaler, Inhale 2 puffs into the lungs every 6 (six) hours as needed for wheezing. DX code R06.02, Disp: 3 Inhaler, Rfl: 3 .  aspirin EC 81 MG tablet, Take 1 tablet (81 mg total) by mouth daily., Disp: 30 tablet, Rfl: 0 .  azelastine (OPTIVAR) 0.05 % ophthalmic solution, Place 1 drop into both eyes 2 (two) times daily., Disp: 6 mL, Rfl: 1 .  buPROPion (WELLBUTRIN XL) 300 MG 24 hr tablet, Take 1 tablet by mouth daily., Disp: , Rfl:  .  Cholecalciferol (VITAMIN D) 2000 UNITS CAPS, Take 1 capsule by mouth daily., Disp: , Rfl:  .  clonazePAM (KLONOPIN) 0.5 MG  tablet, Take 0.5 tablets (0.25 mg total) by mouth daily., Disp: 15 tablet, Rfl: 0 .  cyclobenzaprine (FLEXERIL) 5 MG tablet, TAKE 1 TABLET BY MOUTH  EVERY 8 HOURS AS NEEDED FOR MUSCLE SPASMS. DO NOT DRIVE FOR 8 HOURS, Disp: 90 tablet, Rfl: 1 .  diclofenac sodium (VOLTAREN) 1 % GEL, APP 2 GRAMS EXT AA QID, Disp: , Rfl: 6 .  dicyclomine (BENTYL) 20 MG tablet, Take 1 tablet (20 mg total) by mouth every 6 (six) hours as needed (Abdominal Cramping/Pain)., Disp: 15 tablet, Rfl: 0 .  DULoxetine (CYMBALTA) 60 MG capsule, Take 120 mg by mouth at bedtime. , Disp: , Rfl:  .  FLOVENT HFA 110 MCG/ACT inhaler, INHALE 2 PUFFS PO BID, Disp: , Rfl: 0 .  fluticasone (FLONASE) 50 MCG/ACT nasal spray, USE 2 SPRAYS IN EACH  NOSTRIL DAILY, Disp: 48 g, Rfl: 1 .  gentamicin (GARAMYCIN) 0.3 % ophthalmic solution, Place 2 drops into both eyes 4 (four) times daily.,  Disp: 5 mL, Rfl: 0 .  hydroxychloroquine (PLAQUENIL) 200 MG tablet, Take 200 mg by mouth 2 (two) times daily., Disp: , Rfl:  .  lamoTRIgine (LAMICTAL) 200 MG tablet, Take 1 tablet (200 mg total) by mouth 2 (two) times daily., Disp: 60 tablet, Rfl: 0 .  meclizine (ANTIVERT) 25 MG tablet, TAKE 1 TABLET BY MOUTH 3  TIMES DAILY AS NEEDED, Disp: 30 tablet, Rfl: 0 .  metFORMIN (GLUCOPHAGE-XR) 500 MG 24 hr tablet, Take 1 tablet (500 mg total) by mouth daily with breakfast., Disp: 90 tablet, Rfl: 1 .  Multiple Vitamin (MULTIVITAMIN) tablet, Take 1 tablet by mouth daily., Disp: , Rfl:  .  mycophenolate (CELLCEPT) 500 MG tablet, Take 2 tablets (1,000 mg total) by mouth 2 (two) times daily., Disp: 5 tablet, Rfl: 0 .  omeprazole (PRILOSEC) 40 MG capsule, TAKE 1 CAPSULE BY MOUTH  DAILY, Disp: 90 capsule, Rfl: 1 .  ondansetron (ZOFRAN) 4 MG tablet, TAKE 1 TABLET BY MOUTH  EVERY 8 HOURS AS NEEDED FOR NAUSEA AND VOMITING, Disp: 60 tablet, Rfl: 0 .  polyethylene glycol powder (GLYCOLAX/MIRALAX) powder, 2 cap fulls in a full glass of water, three times a day, for 5 days., Disp: 850  g, Rfl: 0 .  prednisoLONE acetate (PRED FORTE) 1 % ophthalmic suspension, Place 1 drop into both eyes 2 (two) times daily as needed., Disp: , Rfl: 0 .  predniSONE (DELTASONE) 2.5 MG tablet, Take 7.5 mg by mouth daily., Disp: , Rfl: 0 .  ranitidine (ZANTAC) 150 MG tablet, Take 1 tablet by mouth 2 (two) times daily., Disp: , Rfl:  .  topiramate (TOPAMAX) 50 MG tablet, Take 1 tablet by mouth 3 (three) times daily. Dr. [place] James - Pain management, Disp: , Rfl:  .  traZODone (DESYREL) 100 MG tablet, Take 1 tablet by mouth at bedtime., Disp: , Rfl:  .  triamcinolone ointment (KENALOG) 0.1 %, , Disp: , Rfl:  .  valsartan-hydrochlorothiazide (DIOVAN-HCT) 80-12.5 MG tablet, Take 1 tablet by mouth daily. Medication changed because of recall, Disp: 90 tablet, Rfl: 1 .  YUVAFEM 10 MCG TABS vaginal tablet, Place 1 tablet vaginally once a week., Disp: , Rfl:   Allergies  Allergen Reactions  . Ace Inhibitors     Other reaction(s): OTHER Pt states she can not take ace inhibitors. Pt states she can not remember her reaction.   Marland Kitchen Penicillins      ROS  Constitutional: Negative for fever , positive for  weight change - lost 10 lbs since last visit .  Respiratory: Positive  for cough and shortness of breath - stable   Cardiovascular: Negative for chest pain or palpitations.  Gastrointestinal: Negative for abdominal pain, no bowel changes.  Musculoskeletal: Positive for gait problem but no joint swelling.  Skin: Negative for rash.  Neurological: Positive  For intermittent  dizziness and  headache.  No other specific complaints in a complete review of systems (except as listed in HPI above).  Objective  Vitals:   10/31/17 0803 10/31/17 0807  BP: (!) 158/110 130/80  Pulse: 85   Resp: 14   SpO2: 97%   Weight: 256 lb 6.4 oz (116.3 kg)   Height: 5\' 8"  (1.727 m)     Body mass index is 38.99 kg/m.  Physical Exam  Constitutional: Patient appears well-developed and well-nourished. Obese No  distress.  HEENT: head atraumatic, normocephalic, pupils equal and reactive to light,  neck supple, throat within normal limits Cardiovascular: Normal rate, regular rhythm and normal heart sounds.  No murmur heard. No BLE edema. Pulmonary/Chest: Effort normal and breath sounds normal. No respiratory distress. Abdominal: Soft.  There is no tenderness. Psychiatric: Patient has a normal mood and affect. behavior is normal. Judgment and thought content normal   Recent Results (from the past 2160 hour(s))  Hemoglobin A1c     Status: Abnormal   Collection Time: 08/04/17 11:13 AM  Result Value Ref Range   Hgb A1c MFr Bld 6.0 (H) <5.7 % of total Hgb    Comment: For someone without known diabetes, a hemoglobin  A1c value between 5.7% and 6.4% is consistent with prediabetes and should be confirmed with a  follow-up test. . For someone with known diabetes, a value <7% indicates that their diabetes is well controlled. A1c targets should be individualized based on duration of diabetes, age, comorbid conditions, and other considerations. . This assay result is consistent with an increased risk of diabetes. . Currently, no consensus exists regarding use of hemoglobin A1c for diagnosis of diabetes for children. .    Mean Plasma Glucose 126 (calc)   eAG (mmol/L) 7.0 (calc)  Insulin, random     Status: None   Collection Time: 08/04/17 11:13 AM  Result Value Ref Range   Insulin 9.2 2.0 - 19.6 uIU/mL    Comment: This insulin assay shows strong cross-reactivity for some insulin analogs (lispro, aspart, and glargine) and much lower cross-reactivity with others (detemir, glulisine).      PHQ2/9: Depression screen [facility] 2/9 04/28/2017 10/31/2016 07/12/2016 04/29/2016 01/28/2016  Decreased Interest 0 0 0 0 0  Down, Depressed, Hopeless 1 0 0 1 0  PHQ - 2 Score 1 0 0 1 0     Fall Risk: Fall Risk  07/07/2017 04/28/2017 10/31/2016 07/12/2016 04/29/2016  Falls in the past year? No No Yes No No  Number  falls in past yr: - - 1 - -  Injury with Fall? - - No - -     Functional Status Survey: Is the patient deaf or have difficulty hearing?: No Does the patient have difficulty seeing, even when wearing glasses/contacts?: No Does the patient have difficulty concentrating, remembering, or making decisions?: No Does the patient have difficulty walking or climbing stairs?: No Does the patient have difficulty dressing or bathing?: No Does the patient have difficulty doing errands alone such as visiting a doctor's office or shopping?: No    Assessment & Plan  1. Diabetes mellitus type 2 in obese (HCC)  - metFORMIN (GLUCOPHAGE-XR) 500 MG 24 hr tablet; Take 1 tablet (500 mg total) by mouth daily with breakfast.  Dispense: 90 tablet; Refill: 1 - aspirin EC 81 MG tablet; Take 1 tablet (81 mg total) by mouth daily.  Dispense: 30 tablet; Refill: 0 - urine micro   2. Dyslipidemia  Not on medication, on life modification only, we will recheck labs today and if needed start statin therapy  -lipid panel   3. Benign hypertension  Keep a log of bp at home, it was high when she first came in - comp panel   4. Morbid obesity ([place])  Discussed with the patient the risk posed by an increased BMI. Discussed importance of portion control, calorie counting and at least 150 minutes of physical activity weekly. Avoid sweet beverages and drink more water. Eat at least 6 servings of fruit and vegetables daily   5. Chronic nausea  Doing better with medication adjustment   6. Systemic lupus erythematosus with other organ involvement, unspecified SLE type ([place])  Keep follow up  at [facility]. Chronic recurrent major depressive disorder ([place])  Take medication and keep visits with psychiatrist and therapist   8. Organizing pneumonia ([place])  On lower dose of Cellcept   9. Benign neoplasm of pituitary gland and craniopharyngeal duct (HCC)  No symptoms at this time

## 2017-11-01 LAB — LIPID PANEL
Cholesterol: 220 mg/dL — ABNORMAL HIGH (ref <200)
HDL: 62 mg/dL (ref >50)
LDL Cholesterol (Calc): 138 mg/dL (calc) — ABNORMAL HIGH
NON-HDL CHOLESTEROL (CALC): 158 mg/dL — AB (ref <130)
Total CHOL/HDL Ratio: 3.5 (calc) (ref <5.0)
Triglycerides: 101 mg/dL (ref <150)

## 2017-11-01 LAB — COMPREHENSIVE METABOLIC PANEL
AG RATIO: 1.1 (calc) (ref 1.0–2.5)
ALT: 13 U/L (ref 6–29)
AST: 19 U/L (ref 10–35)
Albumin: 3.9 g/dL (ref 3.6–5.1)
Alkaline phosphatase (APISO): 66 U/L (ref 33–130)
BUN: 11 mg/dL (ref 7–25)
CHLORIDE: 103 mmol/L (ref 98–110)
CO2: 31 mmol/L (ref 20–32)
CREATININE: 0.9 mg/dL (ref 0.50–1.05)
Calcium: 9.6 mg/dL (ref 8.6–10.4)
GLOBULIN: 3.7 g/dL (ref 1.9–3.7)
GLUCOSE: 95 mg/dL (ref 65–99)
POTASSIUM: 4.7 mmol/L (ref 3.5–5.3)
Sodium: 140 mmol/L (ref 135–146)
Total Bilirubin: 0.3 mg/dL (ref 0.2–1.2)
Total Protein: 7.6 g/dL (ref 6.1–8.1)

## 2017-11-01 LAB — MICROALBUMIN / CREATININE URINE RATIO
CREATININE, URINE: 130 mg/dL (ref 20–275)
MICROALB/CREAT RATIO: 14 ug/mg{creat} (ref <30)
Microalb, Ur: 1.8 mg/dL

## 2017-11-02 ENCOUNTER — Other Ambulatory Visit: Payer: Self-pay | Admitting: Family Medicine

## 2017-11-02 MED ORDER — ATORVASTATIN CALCIUM 40 MG PO TABS: 40.0000 mg | ORAL_TABLET | Freq: Every day | ORAL | 1 refills | Status: DC

## 2017-11-07 ENCOUNTER — Telehealth: Payer: Self-pay | Admitting: Family Medicine

## 2017-11-07 ENCOUNTER — Ambulatory Visit: Payer: Self-pay

## 2017-11-07 NOTE — Telephone Encounter (Signed)
Pt called back stating that the pharmacy said that it was ok for her to continue her Blood pressure medicine it wasn't on recall

## 2017-11-07 NOTE — Telephone Encounter (Signed)
Copied from [place]. Topic: Quick Communication - See Telephone Encounter >> Nov 07, 2017  8:53 AM Bea Graff, NT wrote: CRM for notification. See Telephone encounter for: Pt states that she contacted her pharmacy last night and the pharmacy told the pt they didn't know if her  valsartan-hydrochlorothiazide (DIOVAN-HCT) and Losartan was recalled or not. Pt wants to see if something else can be ordered that she saw on the news that it was recalled. She is going to contact her pharmacy again to see if they can tell her if it was recalled.  11/07/17.

## 2017-11-07 NOTE — Telephone Encounter (Signed)
Patient called in with c/o "pain to back of head." She says "I've been having this pain to the back of my head on the right side and right side of neck off and on since yesterday. It's a 4 on the pain scale. My neck is not stiff, I have nausea all the time not related to the pain. She denies fever, cold symptoms, eye pain/strain. According to protocol, see PCP within 3 days, appointment made for tomorrow, 11/08/17 at 1100 with Nicole Ballard, [place], care advice given, she verbalized understanding.  Reason for Disposition . [1] MILD-MODERATE headache AND [2] present > 72 hours  Answer Assessment - Initial Assessment Questions 1. LOCATION: "Where does it hurt?"      Back of head on right side 2. ONSET: "When did the headache start?" (Minutes, hours or days)      Yesterday 3. PATTERN: "Does the pain come and go, or has it been constant since it started?"     Come and go 4. SEVERITY: "How bad is the pain?" and "What does it keep you from doing?"  (e.g., Scale 1-10; mild, moderate, or severe)   - MILD (1-3): doesn't interfere with normal activities    - MODERATE (4-7): interferes with normal activities or awakens from sleep    - SEVERE (8-10): excruciating pain, unable to do any normal activities        4 5. RECURRENT SYMPTOM: "Have you ever had headaches before?" If so, ask: "When was the last time?" and "What happened that time?"      Yes 6. CAUSE: "What do you think is causing the headache?"     No 7. MIGRAINE: "Have you been diagnosed with migraine headaches?" If so, ask: "Is this headache similar?"      Yes; not similar to migraine 8. HEAD INJURY: "Has there been any recent injury to the head?"      No 9. OTHER SYMPTOMS: "Do you have any other symptoms?" (fever, stiff neck, eye pain, sore throat, cold symptoms)     Neck pain on right side 10. PREGNANCY: "Is there any chance you are pregnant?" "When was your last menstrual period?"       No  Protocols used: HEADACHE-A-AH

## 2017-11-08 ENCOUNTER — Encounter: Payer: Self-pay | Admitting: Family Medicine

## 2017-11-08 ENCOUNTER — Ambulatory Visit (INDEPENDENT_AMBULATORY_CARE_PROVIDER_SITE_OTHER): Payer: Medicare Other | Admitting: Family Medicine

## 2017-11-08 ENCOUNTER — Other Ambulatory Visit: Payer: Self-pay | Admitting: Family Medicine

## 2017-11-08 VITALS — BP 130/72 | HR 91 | Temp 98.4°F | Resp 16 | Ht 68.0 in | Wt 252.1 lb

## 2017-11-08 DIAGNOSIS — R7303 Prediabetes: Secondary | ICD-10-CM | POA: Diagnosis not present

## 2017-11-08 DIAGNOSIS — E785 Hyperlipidemia, unspecified: Secondary | ICD-10-CM | POA: Diagnosis not present

## 2017-11-08 DIAGNOSIS — R51 Headache: Secondary | ICD-10-CM

## 2017-11-08 DIAGNOSIS — R519 Headache, unspecified: Secondary | ICD-10-CM

## 2017-11-08 DIAGNOSIS — R1013 Epigastric pain: Secondary | ICD-10-CM

## 2017-11-08 MED ORDER — ATORVASTATIN CALCIUM 40 MG PO TABS: 40.0000 mg | ORAL_TABLET | Freq: Every day | ORAL | 0 refills | Status: DC

## 2017-11-08 NOTE — Patient Instructions (Addendum)
DASH Eating Plan DASH stands for "Dietary Approaches to Stop Hypertension." The DASH eating plan is a healthy eating plan that has been shown to reduce high blood pressure (hypertension). It may also reduce your risk for type 2 diabetes, heart disease, and stroke. The DASH eating plan may also help with weight loss. What are tips for following this plan? General guidelines  Avoid eating more than 2,300 mg (milligrams) of salt (sodium) a day. If you have hypertension, you may need to reduce your sodium intake to 1,500 mg a day.  Limit alcohol intake to no more than 1 drink a day for nonpregnant women and 2 drinks a day for men. One drink equals 12 oz of beer, 5 oz of wine, or 1 oz of hard liquor.  Work with your health care provider to maintain a healthy body weight or to lose weight. Ask what an ideal weight is for you.  Get at least 30 minutes of exercise that causes your heart to beat faster (aerobic exercise) most days of the week. Activities may include walking, swimming, or biking.  Work with your health care provider or diet and nutrition specialist (dietitian) to adjust your eating plan to your individual calorie needs. Reading food labels  Check food labels for the amount of sodium per serving. Choose foods with less than 5 percent of the Daily Value of sodium. Generally, foods with less than 300 mg of sodium per serving fit into this eating plan.  To find whole grains, look for the word "whole" as the first word in the ingredient list. Shopping  Buy products labeled as "low-sodium" or "no salt added."  Buy fresh foods. Avoid canned foods and premade or frozen meals. Cooking  Avoid adding salt when cooking. Use salt-free seasonings or herbs instead of table salt or sea salt. Check with your health care provider or pharmacist before using salt substitutes.  Do not fry foods. Cook foods using healthy methods such as baking, boiling, grilling, and broiling instead.  Cook with  heart-healthy oils, such as olive, canola, soybean, or sunflower oil. Meal planning   Eat a balanced diet that includes: ? 5 or more servings of fruits and vegetables each day. At each meal, try to fill half of your plate with fruits and vegetables. ? Up to 6-8 servings of whole grains each day. ? Less than 6 oz of lean meat, poultry, or fish each day. A 3-oz serving of meat is about the same size as a deck of cards. One egg equals 1 oz. ? 2 servings of low-fat dairy each day. ? A serving of nuts, seeds, or beans 5 times each week. ? Heart-healthy fats. Healthy fats called Omega-3 fatty acids are found in foods such as flaxseeds and coldwater fish, like sardines, salmon, and mackerel.  Limit how much you eat of the following: ? Canned or prepackaged foods. ? Food that is high in trans fat, such as fried foods. ? Food that is high in saturated fat, such as fatty meat. ? Sweets, desserts, sugary drinks, and other foods with added sugar. ? Full-fat dairy products.  Do not salt foods before eating.  Try to eat at least 2 vegetarian meals each week.  Eat more home-cooked food and less restaurant, buffet, and fast food.  When eating at a restaurant, ask that your food be prepared with less salt or no salt, if possible. What foods are recommended? The items listed may not be a complete list. Talk with your dietitian about what   dietary choices are best for you. Grains Whole-grain or whole-wheat bread. Whole-grain or whole-wheat pasta. Brown rice. Oatmeal. Quinoa. Bulgur. Whole-grain and low-sodium cereals. Pita bread. Low-fat, low-sodium crackers. Whole-wheat flour tortillas. Vegetables Fresh or frozen vegetables (raw, steamed, roasted, or grilled). Low-sodium or reduced-sodium tomato and vegetable juice. Low-sodium or reduced-sodium tomato sauce and tomato paste. Low-sodium or reduced-sodium canned vegetables. Fruits All fresh, dried, or frozen fruit. Canned fruit in natural juice (without  added sugar). Meat and other protein foods Skinless chicken or turkey. Ground chicken or turkey. Pork with fat trimmed off. Fish and seafood. Egg whites. Dried beans, peas, or lentils. Unsalted nuts, nut butters, and seeds. Unsalted canned beans. Lean cuts of beef with fat trimmed off. Low-sodium, lean deli meat. Dairy Low-fat (1%) or fat-free (skim) milk. Fat-free, low-fat, or reduced-fat cheeses. Nonfat, low-sodium ricotta or cottage cheese. Low-fat or nonfat yogurt. Low-fat, low-sodium cheese. Fats and oils Soft margarine without trans fats. Vegetable oil. Low-fat, reduced-fat, or light mayonnaise and salad dressings (reduced-sodium). Canola, safflower, olive, soybean, and sunflower oils. Avocado. Seasoning and other foods Herbs. Spices. Seasoning mixes without salt. Unsalted popcorn and pretzels. Fat-free sweets. What foods are not recommended? The items listed may not be a complete list. Talk with your dietitian about what dietary choices are best for you. Grains Baked goods made with fat, such as croissants, muffins, or some breads. Dry pasta or rice meal packs. Vegetables Creamed or fried vegetables. Vegetables in a cheese sauce. Regular canned vegetables (not low-sodium or reduced-sodium). Regular canned tomato sauce and paste (not low-sodium or reduced-sodium). Regular tomato and vegetable juice (not low-sodium or reduced-sodium). Pickles. Olives. Fruits Canned fruit in a light or heavy syrup. Fried fruit. Fruit in cream or butter sauce. Meat and other protein foods Fatty cuts of meat. Ribs. Fried meat. Bacon. Sausage. Bologna and other processed lunch meats. Salami. Fatback. Hotdogs. Bratwurst. Salted nuts and seeds. Canned beans with added salt. Canned or smoked fish. Whole eggs or egg yolks. Chicken or turkey with skin. Dairy Whole or 2% milk, cream, and half-and-half. Whole or full-fat cream cheese. Whole-fat or sweetened yogurt. Full-fat cheese. Nondairy creamers. Whipped toppings.  Processed cheese and cheese spreads. Fats and oils Butter. Stick margarine. Lard. Shortening. Ghee. Bacon fat. Tropical oils, such as coconut, palm kernel, or palm oil. Seasoning and other foods Salted popcorn and pretzels. Onion salt, garlic salt, seasoned salt, table salt, and sea salt. Worcestershire sauce. Tartar sauce. Barbecue sauce. Teriyaki sauce. Soy sauce, including reduced-sodium. Steak sauce. Canned and packaged gravies. Fish sauce. Oyster sauce. Cocktail sauce. Horseradish that you find on the shelf. Ketchup. Mustard. Meat flavorings and tenderizers. Bouillon cubes. Hot sauce and Tabasco sauce. Premade or packaged marinades. Premade or packaged taco seasonings. Relishes. Regular salad dressings. Where to find more information:  National Heart, Lung, and Blood Institute: www.nhlbi.nih.gov  American Heart Association: www.heart.org Summary  The DASH eating plan is a healthy eating plan that has been shown to reduce high blood pressure (hypertension). It may also reduce your risk for type 2 diabetes, heart disease, and stroke.  With the DASH eating plan, you should limit salt (sodium) intake to 2,300 mg a day. If you have hypertension, you may need to reduce your sodium intake to 1,500 mg a day.  When on the DASH eating plan, aim to eat more fresh fruits and vegetables, whole grains, lean proteins, low-fat dairy, and heart-healthy fats.  Work with your health care provider or diet and nutrition specialist (dietitian) to adjust your eating plan to your individual   calorie needs. This information is not intended to replace advice given to you by your health care provider. Make sure you discuss any questions you have with your health care provider. Document Released: 08/11/2011 Document Revised: 08/15/2016 Document Reviewed: 08/15/2016 Elsevier Interactive Patient Education  2018 [employer]  Diabetes Mellitus and Nutrition When you have diabetes (diabetes mellitus), it is very  important to have healthy eating habits because your blood sugar (glucose) levels are greatly affected by what you eat and drink. Eating healthy foods in the appropriate amounts, at about the same times every day, can help you:  Control your blood glucose.  Lower your risk of heart disease.  Improve your blood pressure.  Reach or maintain a healthy weight.  Every person with diabetes is different, and each person has different needs for a meal plan. Your health care provider may recommend that you work with a diet and nutrition specialist (dietitian) to make a meal plan that is best for you. Your meal plan may vary depending on factors such as:  The calories you need.  The medicines you take.  Your weight.  Your blood glucose, blood pressure, and cholesterol levels.  Your activity level.  Other health conditions you have, such as heart or kidney disease.  How do carbohydrates affect me? Carbohydrates affect your blood glucose level more than any other type of food. Eating carbohydrates naturally increases the amount of glucose in your blood. Carbohydrate counting is a method for keeping track of how many carbohydrates you eat. Counting carbohydrates is important to keep your blood glucose at a healthy level, especially if you use insulin or take certain oral diabetes medicines. It is important to know how many carbohydrates you can safely have in each meal. This is different for every person. Your dietitian can help you calculate how many carbohydrates you should have at each meal and for snack. Foods that contain carbohydrates include:  Bread, cereal, rice, pasta, and crackers.  Potatoes and corn.  Peas, beans, and lentils.  Milk and yogurt.  Fruit and juice.  Desserts, such as cakes, cookies, ice cream, and candy.  How does alcohol affect me? Alcohol can cause a sudden decrease in blood glucose (hypoglycemia), especially if you use insulin or take certain oral diabetes  medicines. Hypoglycemia can be a life-threatening condition. Symptoms of hypoglycemia (sleepiness, dizziness, and confusion) are similar to symptoms of having too much alcohol. If your health care provider says that alcohol is safe for you, follow these guidelines:  Limit alcohol intake to no more than 1 drink per day for nonpregnant women and 2 drinks per day for men. One drink equals 12 oz of beer, 5 oz of wine, or 1 oz of hard liquor.  Do not drink on an empty stomach.  Keep yourself hydrated with water, diet soda, or unsweetened iced tea.  Keep in mind that regular soda, juice, and other mixers may contain a lot of sugar and must be counted as carbohydrates.  What are tips for following this plan? Reading food labels  Start by checking the serving size on the label. The amount of calories, carbohydrates, fats, and other nutrients listed on the label are based on one serving of the food. Many foods contain more than one serving per package.  Check the total grams (g) of carbohydrates in one serving. You can calculate the number of servings of carbohydrates in one serving by dividing the total carbohydrates by 15. For example, if a food has 30 g of total   carbohydrates, it would be equal to 2 servings of carbohydrates.  Check the number of grams (g) of saturated and trans fats in one serving. Choose foods that have low or no amount of these fats.  Check the number of milligrams (mg) of sodium in one serving. Most people should limit total sodium intake to less than 2,300 mg per day.  Always check the nutrition information of foods labeled as "low-fat" or "nonfat". These foods may be higher in added sugar or refined carbohydrates and should be avoided.  Talk to your dietitian to identify your daily goals for nutrients listed on the label. Shopping  Avoid buying canned, premade, or processed foods. These foods tend to be high in fat, sodium, and added sugar.  Shop around the outside edge  of the grocery store. This includes fresh fruits and vegetables, bulk grains, fresh meats, and fresh dairy. Cooking  Use low-heat cooking methods, such as baking, instead of high-heat cooking methods like deep frying.  Cook using healthy oils, such as olive, canola, or sunflower oil.  Avoid cooking with butter, cream, or high-fat meats. Meal planning  Eat meals and snacks regularly, preferably at the same times every day. Avoid going long periods of time without eating.  Eat foods high in fiber, such as fresh fruits, vegetables, beans, and whole grains. Talk to your dietitian about how many servings of carbohydrates you can eat at each meal.  Eat 4-6 ounces of lean protein each day, such as lean meat, chicken, fish, eggs, or tofu. 1 ounce is equal to 1 ounce of meat, chicken, or fish, 1 egg, or 1/4 cup of tofu.  Eat some foods each day that contain healthy fats, such as avocado, nuts, seeds, and fish. Lifestyle   Check your blood glucose regularly.  Exercise at least 30 minutes 5 or more days each week, or as told by your health care provider.  Take medicines as told by your health care provider.  Do not use any products that contain nicotine or tobacco, such as cigarettes and e-cigarettes. If you need help quitting, ask your health care provider.  Work with a counselor or diabetes educator to identify strategies to manage stress and any emotional and social challenges. What are some questions to ask my health care provider?  Do I need to meet with a diabetes educator?  Do I need to meet with a dietitian?  What number can I call if I have questions?  When are the best times to check my blood glucose? Where to find more information:  American Diabetes Association: diabetes.org/food-and-fitness/food  Academy of Nutrition and Dietetics: www.eatright.org/resources/health/diseases-and-conditions/diabetes  National Institute of Diabetes and Digestive and Kidney Diseases (NIH):  www.niddk.nih.gov/health-information/diabetes/overview/diet-eating-physical-activity Summary  A healthy meal plan will help you control your blood glucose and maintain a healthy lifestyle.  Working with a diet and nutrition specialist (dietitian) can help you make a meal plan that is best for you.  Keep in mind that carbohydrates and alcohol have immediate effects on your blood glucose levels. It is important to count carbohydrates and to use alcohol carefully. This information is not intended to replace advice given to you by your health care provider. Make sure you discuss any questions you have with your health care provider. Document Released: 05/19/2005 Document Revised: 09/26/2016 Document Reviewed: 09/26/2016 [employer] Interactive Patient Education  2018 [employer]  

## 2017-11-08 NOTE — Progress Notes (Signed)
Name: Nicole Ballard   MRN: [id number]    DOB: 1955/11/25   Date:11/08/2017       Progress Note  Subjective  Chief Complaint  Chief Complaint  Patient presents with  . Neck Pain    throbbing, on and off for weeks  . Back Pain  . Chest Pain    HPI  Headache Patient endorses right posterior headache. Patient states can pinpoint it to one spot behind right ear. Patient states pain is intermittent and radiates down the right side of her neck. Pt sts is has happened every day for approximately 2 weeks. Patient states tingling pain last for a few minutes and is intermittent throughout the whole day. Patient states took tylenol without relief of symptoms. Pt sts had a history of migraines but does not feel like that. Pt states she drinks a lot of water. Pt denies any known triggers for headache. Pt denies blurry vision, photophobia/ phonophobia, slurred speech, weakness- pt endorses constant baseline nausea but no changes or worsening during headache. Pt endorses lightheadedness but is unsure if it is related to her vertigo.  Had prior surgery to help hemifacial spasm in 2000- symptoms went completely away after this - surgery was performed in the right occipital area at the point of current pain.  Different from usual migraines; taking topamax PRN (Advised it is Rx'd as a daily medication - she will call her pain management provider to inquire about how to taper up to the TID dosing that is ordered for her), gabapentin daily, prednisone daily.  Epigastric Pain Patient endorses epigastric pain ongoing for a couple of weeks. Patient states pain occurs after meals. Patient eating cabbage, beans, sweet potatoes daily - trying to lose weight as she was recently told that she is prediabetic. Patient states pain lasts a few minutes and comes and goes a few times before resolving - usually occurs after meals; does not occur at night. No increased nausea, non-radiating pain, no shob, palpitations, no changes  in bm.    Dyslipidemia Pt is in need of a short supply of atorvastatin while she awaits 90-day supply from mail order. 90-day supply was provided by PCP Dr. Ancil Boozer on 11/02/2017 - will provide 14 day supply today to get her through until mail order supply arrives.  Recent lipid panel is reviewed.   Patient Active Problem List   Diagnosis Date Noted  . Hyperglycemia 10/23/2017  . Vertigo 09/29/2016  . Bronchiolitis obliterans organizing pneumonia ([place]) 09/29/2016  . Neck pain, musculoskeletal 12/15/2015  . Anxiety about health 09/29/2015  . Abnormal CAT scan 04/11/2015  . Auditory impairment 04/11/2015  . Insomnia, persistent 04/11/2015  . Chronic kidney disease (CKD), stage I 04/11/2015  . Chronic nonmalignant pain 04/11/2015  . Chronic constipation 04/11/2015  . Dyslipidemia 04/11/2015  . Fibromyalgia 04/11/2015  . Gastro-esophageal reflux disease without esophagitis 04/11/2015  . Dysphonia 04/11/2015  . Absence of bladder continence 04/11/2015  . Low back pain 04/11/2015  . Eczema intertrigo 04/11/2015  . Keratosis pilaris 04/11/2015  . Chronic recurrent major depressive disorder ([place]) 04/11/2015  . Dysmetabolic syndrome 23/76/2831  . Neurogenic claudication 04/11/2015  . Perennial allergic rhinitis with seasonal variation 04/11/2015  . Abnormal presence of protein in urine 04/11/2015  . Seborrhea capitis 04/11/2015  . Moderate dysplasia of vulva 04/11/2015  . Vitamin D deficiency 04/11/2015  . Treadmill stress test negative for angina pectoris 03/05/2015  . Shortness of breath 05/20/2014  . Morbid obesity ([place]) 02/03/2014  . Asthma, chronic 12/31/2013  .  H/O total knee replacement 12/31/2013  . Episcleritis 09/26/2013  . Vocal cord dysfunction 05/28/2013  . Anxiety, generalized 03/19/2013  . Back pain 12/18/2012  . Pulmonary nodules 11/26/2012  . Lung nodule, multiple 11/26/2012  . Arthritis, degenerative 11/01/2012  . H/O aspiration pneumonitis 10/22/2012  .  Postinflammatory pulmonary fibrosis ([place]) 10/22/2012  . Mixed incontinence 05/04/2012  . Benign neoplasm of pituitary gland and craniopharyngeal duct ([place]) 12/29/2010  . Lung involvement in systemic lupus erythematosus ([place]) 11/05/2010  . Chronic nausea 07/01/2008  . Benign hypertension 01/25/2005    Social History   Tobacco Use  . Smoking status: Never Smoker  . Smokeless tobacco: Never Used  Substance Use Topics  . Alcohol use: No    Alcohol/week: 0.0 oz     Current Outpatient Medications:  .  albuterol (PROAIR HFA) 108 (90 BASE) MCG/ACT inhaler, Inhale 2 puffs into the lungs every 6 (six) hours as needed for wheezing. DX code R06.02, Disp: 3 Inhaler, Rfl: 3 .  aspirin EC 81 MG tablet, Take 1 tablet (81 mg total) by mouth daily., Disp: 30 tablet, Rfl: 0 .  atorvastatin (LIPITOR) 40 MG tablet, Take 1 tablet (40 mg total) by mouth daily., Disp: 90 tablet, Rfl: 1 .  azelastine (OPTIVAR) 0.05 % ophthalmic solution, Place 1 drop into both eyes 2 (two) times daily., Disp: 6 mL, Rfl: 1 .  buPROPion (WELLBUTRIN XL) 300 MG 24 hr tablet, Take 1 tablet by mouth daily., Disp: , Rfl:  .  Cholecalciferol (VITAMIN D) 2000 UNITS CAPS, Take 1 capsule by mouth daily., Disp: , Rfl:  .  clonazePAM (KLONOPIN) 0.5 MG tablet, Take 0.5 tablets (0.25 mg total) by mouth daily., Disp: 15 tablet, Rfl: 0 .  cyclobenzaprine (FLEXERIL) 5 MG tablet, TAKE 1 TABLET BY MOUTH  EVERY 8 HOURS AS NEEDED FOR MUSCLE SPASMS. DO NOT DRIVE FOR 8 HOURS, Disp: 90 tablet, Rfl: 1 .  diclofenac sodium (VOLTAREN) 1 % GEL, APP 2 GRAMS EXT AA QID, Disp: , Rfl: 6 .  dicyclomine (BENTYL) 20 MG tablet, Take 1 tablet (20 mg total) by mouth every 6 (six) hours as needed (Abdominal Cramping/Pain)., Disp: 15 tablet, Rfl: 0 .  DULoxetine (CYMBALTA) 60 MG capsule, Take 120 mg by mouth at bedtime. , Disp: , Rfl:  .  FLOVENT HFA 110 MCG/ACT inhaler, INHALE 2 PUFFS PO BID, Disp: , Rfl: 0 .  fluticasone (FLONASE) 50 MCG/ACT nasal spray, USE 2  SPRAYS IN EACH  NOSTRIL DAILY, Disp: 48 g, Rfl: 1 .  gabapentin (NEURONTIN) 300 MG capsule, Take 1 capsule by mouth 3 (three) times daily., Disp: , Rfl:  .  gentamicin (GARAMYCIN) 0.3 % ophthalmic solution, Place 2 drops into both eyes 4 (four) times daily., Disp: 5 mL, Rfl: 0 .  hydroxychloroquine (PLAQUENIL) 200 MG tablet, Take 200 mg by mouth 2 (two) times daily., Disp: , Rfl:  .  lamoTRIgine (LAMICTAL) 200 MG tablet, Take 1 tablet (200 mg total) by mouth 2 (two) times daily., Disp: 60 tablet, Rfl: 0 .  meclizine (ANTIVERT) 25 MG tablet, TAKE 1 TABLET BY MOUTH 3  TIMES DAILY AS NEEDED, Disp: 30 tablet, Rfl: 0 .  metFORMIN (GLUCOPHAGE-XR) 500 MG 24 hr tablet, Take 1 tablet (500 mg total) by mouth daily with breakfast., Disp: 90 tablet, Rfl: 1 .  Multiple Vitamin (MULTIVITAMIN) tablet, Take 1 tablet by mouth daily., Disp: , Rfl:  .  mycophenolate (CELLCEPT) 500 MG tablet, Take 2 tablets (1,000 mg total) by mouth 2 (two) times daily., Disp: 5 tablet,  Rfl: 0 .  omeprazole (PRILOSEC) 40 MG capsule, TAKE 1 CAPSULE BY MOUTH  DAILY, Disp: 90 capsule, Rfl: 1 .  ondansetron (ZOFRAN) 4 MG tablet, TAKE 1 TABLET BY MOUTH  EVERY 8 HOURS AS NEEDED FOR NAUSEA AND VOMITING, Disp: 60 tablet, Rfl: 0 .  polyethylene glycol powder (GLYCOLAX/MIRALAX) powder, 2 cap fulls in a full glass of water, three times a day, for 5 days., Disp: 850 g, Rfl: 0 .  prednisoLONE acetate (PRED FORTE) 1 % ophthalmic suspension, Place 1 drop into both eyes 2 (two) times daily as needed., Disp: , Rfl: 0 .  predniSONE (DELTASONE) 2.5 MG tablet, Take 7.5 mg by mouth daily., Disp: , Rfl: 0 .  ranitidine (ZANTAC) 150 MG tablet, Take 1 tablet by mouth 2 (two) times daily., Disp: , Rfl:  .  topiramate (TOPAMAX) 50 MG tablet, Take 1 tablet by mouth 3 (three) times daily. Dr. [place] James - Pain management, Disp: , Rfl:  .  traZODone (DESYREL) 100 MG tablet, Take 1 tablet by mouth at bedtime., Disp: , Rfl:  .  triamcinolone ointment (KENALOG)  0.1 %, , Disp: , Rfl:  .  valsartan-hydrochlorothiazide (DIOVAN-HCT) 80-12.5 MG tablet, Take 1 tablet by mouth daily. Medication changed because of recall, Disp: 90 tablet, Rfl: 1 .  YUVAFEM 10 MCG TABS vaginal tablet, Place 1 tablet vaginally once a week., Disp: , Rfl:   Allergies  Allergen Reactions  . Ace Inhibitors     Other reaction(s): OTHER Pt states she can not take ace inhibitors. Pt states she can not remember her reaction.   Marland Kitchen Penicillins     ROS  Constitutional: Negative for fever or weight change.  Respiratory: Negative for cough and shortness of breath.   Cardiovascular: Negative for chest pain or palpitations.  Gastrointestinal: See HPI Musculoskeletal: Negative for gait problem or joint swelling.  Skin: Negative for rash.  Neurological: See HPI  No other specific complaints in a complete review of systems (except as listed in HPI above).  Objective  Vitals:   11/08/17 1116  BP: 130/72  Pulse: 91  Resp: 16  Temp: 98.4 F (36.9 C)  TempSrc: Oral  SpO2: 98%  Weight: 252 lb 1.6 oz (114.4 kg)  Height: 5\' 8"  (1.727 m)   Body mass index is 38.33 kg/m.  Nursing Note and Vital Signs reviewed.  Physical Exam  Constitutional: Patient appears well-developed and well-nourished. Obese. No distress.  HEENT: head atraumatic, normocephalic, pupils equal and reactive to light, EOM's intact, TM's without erythema or bulging, no maxillary or frontal sinus tenderness , neck supple without lymphadenopathy, oropharynx pink and moist without exudate Cardiovascular: Normal rate, regular rhythm, S1/S2 present.  No murmur or rub heard. No BLE edema. Pulmonary/Chest: Effort normal and breath sounds clear. No respiratory distress or retractions. Abdominal: Soft and non-tender to palpation, bowel sounds present x4 quadrants. Psychiatric: Patient has a normal mood and affect. behavior is normal. Judgment and thought content normal. Neurological: she is alert and oriented to person,  place, and time. No cranial nerve deficit. Coordination, balance, strength, speech and gait are normal.  Skin: Skin is warm and dry. No rash noted. No erythema.   No results found for this or any previous visit (from the past 72 hour(s)).  Assessment & Plan  1. Nonintractable episodic headache, unspecified headache type - Advised that because this is in the exact location of her prior surgical procedure, we will refer to neurology for further evaluation.  In the meantime, she will call her pain  management provider to discuss tapering up on topamax as it is prescribed. - Ambulatory referral to Neurology  2. Epigastric pain - EKG 12-Lead - EKG: normal EKG, normal sinus rhythm, unchanged from previous tracings. - Continue prilosec 40mg  daily and Zantac 150mg  BID.  - Significant teaching regarding diet is provided, referral to nutrition is placed. - Reduce beans to help reduce gas burden; take OTC gas-X PRN.  3. Dyslipidemia - Pt is in need of a short supply of atorvastatin while she awaits 90-day supply from mail order. This is provided today. - Recent lipid panel is reviewed and PCP Dr. Ancil Boozer' recommendations are reviewed. - atorvastatin (LIPITOR) 40 MG tablet; Take 1 tablet (40 mg total) by mouth daily.  Dispense: 14 tablet; Refill: 0  4. Prediabetes - Significant lifestyle modification discussion is performed during visit today - see handout for details.  Advised a well-balanced diet is very important including fresh fruits and vegetables, lean proteins, and plenty of water. Discussed importance of 150 minutes of physical activity weekly, eat two servings of fish weekly, eat one serving of tree nuts ( cashews, pistachios, pecans, almonds.Marland Kitchen) every other day, eat 6 servings of fruit/vegetables daily and drink plenty of water and avoid sweet beverages.  She requests referral to nutrition to discuss further. - Amb ref to Medical Nutrition Therapy-MNT  -Red flags and when to present for  emergency care or RTC including fever >101.35F, chest pain, shortness of breath, new/worsening/un-resolving symptoms, reviewed with patient at time of visit. Follow up and care instructions discussed and provided in AVS.

## 2017-11-09 ENCOUNTER — Emergency Department
Admission: EM | Admit: 2017-11-09 | Discharge: 2017-11-09 | Disposition: A | Discharge status: Home / Self Care | Payer: Medicare Other | Attending: Student in an Organized Health Care Education/Training Program | Admitting: Student in an Organized Health Care Education/Training Program

## 2017-11-09 ENCOUNTER — Encounter: Payer: Self-pay | Admitting: Emergency Medicine

## 2017-11-09 ENCOUNTER — Other Ambulatory Visit: Payer: Self-pay

## 2017-11-09 ENCOUNTER — Ambulatory Visit: Admit: 2017-11-09 | Discharge: 2017-11-10 | Payer: MEDICARE | Attending: Nephrology | Primary: Nephrology

## 2017-11-09 DIAGNOSIS — Z7982 Long term (current) use of aspirin: Secondary | ICD-10-CM | POA: Insufficient documentation

## 2017-11-09 DIAGNOSIS — I159 Secondary hypertension, unspecified: Secondary | ICD-10-CM

## 2017-11-09 DIAGNOSIS — R51 Headache: Secondary | ICD-10-CM | POA: Diagnosis not present

## 2017-11-09 DIAGNOSIS — R519 Headache, unspecified: Secondary | ICD-10-CM

## 2017-11-09 DIAGNOSIS — Z79899 Other long term (current) drug therapy: Secondary | ICD-10-CM | POA: Insufficient documentation

## 2017-11-09 DIAGNOSIS — J45909 Unspecified asthma, uncomplicated: Secondary | ICD-10-CM | POA: Insufficient documentation

## 2017-11-09 DIAGNOSIS — I129 Hypertensive chronic kidney disease with stage 1 through stage 4 chronic kidney disease, or unspecified chronic kidney disease: Secondary | ICD-10-CM | POA: Diagnosis not present

## 2017-11-09 DIAGNOSIS — Z7984 Long term (current) use of oral hypoglycemic drugs: Secondary | ICD-10-CM | POA: Diagnosis not present

## 2017-11-09 DIAGNOSIS — E785 Hyperlipidemia, unspecified: Secondary | ICD-10-CM | POA: Insufficient documentation

## 2017-11-09 DIAGNOSIS — N181 Chronic kidney disease, stage 1: Secondary | ICD-10-CM | POA: Insufficient documentation

## 2017-11-09 DIAGNOSIS — I10 Essential (primary) hypertension: Principal | ICD-10-CM

## 2017-11-09 LAB — BASIC METABOLIC PANEL
Anion gap: 9 (ref 5–15)
BUN: 14 mg/dL (ref 6–20)
CHLORIDE: 99 mmol/L — AB (ref 101–111)
CO2: 28 mmol/L (ref 22–32)
CREATININE: 0.98 mg/dL (ref 0.44–1.00)
Calcium: 9 mg/dL (ref 8.9–10.3)
GFR calc Af Amer: >60 mL/min (ref >60)
GFR calc non Af Amer: >60 mL/min (ref >60)
GLUCOSE: 91 mg/dL (ref 65–99)
POTASSIUM: 4 mmol/L (ref 3.5–5.1)
Sodium: 136 mmol/L (ref 135–145)

## 2017-11-09 LAB — CBC WITH DIFFERENTIAL/PLATELET
Basophils Absolute: 0 10*3/uL (ref 0–0.1)
Basophils Relative: 1 %
EOS ABS: 0.1 10*3/uL (ref 0–0.7)
Eosinophils Relative: 1 %
HEMATOCRIT: 41.8 % (ref 35.0–47.0)
HEMOGLOBIN: 13.7 g/dL (ref 12.0–16.0)
LYMPHS ABS: 1.2 10*3/uL (ref 1.0–3.6)
LYMPHS PCT: 19 %
MCH: 27.3 pg (ref 26.0–34.0)
MCHC: 32.8 g/dL (ref 32.0–36.0)
MCV: 83.2 fL (ref 80.0–100.0)
Monocytes Absolute: 0.6 10*3/uL (ref 0.2–0.9)
Monocytes Relative: 9 %
NEUTROS PCT: 70 %
Neutro Abs: 4.5 10*3/uL (ref 1.4–6.5)
Platelets: 329 10*3/uL (ref 150–440)
RBC: 5.02 MIL/uL (ref 3.80–5.20)
RDW: 14.5 % (ref 11.5–14.5)
WBC: 6.4 10*3/uL (ref 3.6–11.0)

## 2017-11-09 LAB — TROPONIN I

## 2017-11-09 MED ORDER — ACETAMINOPHEN 500 MG PO TABS
1000.0000 mg | ORAL_TABLET | Freq: Once | ORAL | Status: AC
  Administered 2017-11-09: 1000 mg via ORAL
  Filled 2017-11-09: qty 2

## 2017-11-09 MED ORDER — AMLODIPINE BESYLATE 5 MG PO TABS
10.0000 mg | ORAL_TABLET | Freq: Once | ORAL | Status: AC
  Administered 2017-11-09: 10 mg via ORAL
  Filled 2017-11-09: qty 2

## 2017-11-09 NOTE — ED Triage Notes (Signed)
Pt to ED via POV c/o high blood pressure. Pt states that her medications were changed about 2 weeks ago and she has been having problems with her blood pressure since. Pt states that she is having right sided chest pain and headache. [t denies visual changes at this time. Pt in NAD at this time.

## 2017-11-09 NOTE — Discharge Instructions (Signed)

## 2017-11-09 NOTE — ED Provider Notes (Signed)
[facility] Emergency Department Provider Note    None    (approximate)  I have reviewed the triage vital signs and the nursing notes.   HISTORY  Chief Complaint Hypertension    HPI Nicole Ballard is a 62 y.o. female presents with chief complaint of headache chest pain for the past several days as well as elevated blood pressure.  Denies any numbness or tingling.  This not the worst headache of her life.  Denies any shortness of breath.  No orthopnea or lower extremity swelling.  Patient recently changed her medication from losartan to valsartan and feels like her blood pressure is not being appropriately controlled with that.  She checked her blood pressure at [facility] today and noticed that it was elevated to the 160s and subsequently went to urgent care where he was elevated again and she was directed to the ER based on her several days of chest pain and headache.  Currently describes the chest pain and headache is mild in severity consistent with previous episodes.  No blurry vision.  Past Medical History:  Diagnosis Date  . Anxiety   . Bipolar 1 disorder ([place])   . Chronic kidney disease   . Chronic pain   . Fibromyalgia   . GERD (gastroesophageal reflux disease)   . Hearing loss of right ear   . Hemifacial spasm   . Hyperlipidemia   . Hypertension   . Insomnia   . Lumbago   . Osteoarthritis   . Paresthesia   . Rhinitis   . Systemic lupus erythematosus ([place])   . Vitamin D deficiency    Family History  Problem Relation Age of Onset  . Breast cancer Mother   . Cancer Mother 32       Breast  . Cancer Father 51       Stomach  . Breast cancer Maternal Aunt    Past Surgical History:  Procedure Laterality Date  . BRAIN SURGERY    . CHOLECYSTECTOMY    . VAGINAL HYSTERECTOMY     Patient Active Problem List   Diagnosis Date Noted  . Hyperglycemia 10/23/2017  . Vertigo 09/29/2016  . Bronchiolitis obliterans organizing pneumonia ([place])  09/29/2016  . Neck pain, musculoskeletal 12/15/2015  . Anxiety about health 09/29/2015  . Abnormal CAT scan 04/11/2015  . Auditory impairment 04/11/2015  . Insomnia, persistent 04/11/2015  . Chronic kidney disease (CKD), stage I 04/11/2015  . Chronic nonmalignant pain 04/11/2015  . Chronic constipation 04/11/2015  . Dyslipidemia 04/11/2015  . Fibromyalgia 04/11/2015  . Gastro-esophageal reflux disease without esophagitis 04/11/2015  . Dysphonia 04/11/2015  . Absence of bladder continence 04/11/2015  . Low back pain 04/11/2015  . Eczema intertrigo 04/11/2015  . Keratosis pilaris 04/11/2015  . Chronic recurrent major depressive disorder ([place]) 04/11/2015  . Dysmetabolic syndrome 11/91/4782  . Neurogenic claudication 04/11/2015  . Perennial allergic rhinitis with seasonal variation 04/11/2015  . Abnormal presence of protein in urine 04/11/2015  . Seborrhea capitis 04/11/2015  . Moderate dysplasia of vulva 04/11/2015  . Vitamin D deficiency 04/11/2015  . Treadmill stress test negative for angina pectoris 03/05/2015  . Shortness of breath 05/20/2014  . Morbid obesity ([place]) 02/03/2014  . Asthma, chronic 12/31/2013  . H/O total knee replacement 12/31/2013  . Episcleritis 09/26/2013  . Vocal cord dysfunction 05/28/2013  . Anxiety, generalized 03/19/2013  . Back pain 12/18/2012  . Pulmonary nodules 11/26/2012  . Lung nodule, multiple 11/26/2012  . Arthritis, degenerative 11/01/2012  . H/O aspiration pneumonitis  10/22/2012  . Postinflammatory pulmonary fibrosis ([place]) 10/22/2012  . Mixed incontinence 05/04/2012  . Benign neoplasm of pituitary gland and craniopharyngeal duct ([place]) 12/29/2010  . Lung involvement in systemic lupus erythematosus ([place]) 11/05/2010  . Chronic nausea 07/01/2008  . Benign hypertension 01/25/2005      Prior to Admission medications   Medication Sig Start Date End Date Taking? Authorizing Provider  albuterol (PROAIR HFA) 108 (90 BASE) MCG/ACT inhaler  Inhale 2 puffs into the lungs every 6 (six) hours as needed for wheezing. DX code R06.02 02/24/15   Juanito Doom, MD  aspirin EC 81 MG tablet Take 1 tablet (81 mg total) by mouth daily. 10/31/17   Steele Sizer, MD  atorvastatin (LIPITOR) 40 MG tablet Take 1 tablet (40 mg total) by mouth daily. 11/08/17   Hubbard Hartshorn, FNP  azelastine (OPTIVAR) 0.05 % ophthalmic solution Place 1 drop into both eyes 2 (two) times daily. 01/19/17   Hubbard Hartshorn, FNP  buPROPion (WELLBUTRIN XL) 300 MG 24 hr tablet Take 1 tablet by mouth daily. 12/27/16   [provider]  Cholecalciferol (VITAMIN D) 2000 UNITS CAPS Take 1 capsule by mouth daily.    [provider]  clonazePAM (KLONOPIN) 0.5 MG tablet Take 0.5 tablets (0.25 mg total) by mouth daily. 03/16/16   Steele Sizer, MD  cyclobenzaprine (FLEXERIL) 5 MG tablet TAKE 1 TABLET BY MOUTH  EVERY 8 HOURS AS NEEDED FOR MUSCLE SPASMS. DO NOT DRIVE FOR 8 HOURS 05/12/66   Steele Sizer, MD  diclofenac sodium (VOLTAREN) 1 % GEL APP 2 GRAMS EXT AA QID 04/17/15   [provider]  dicyclomine (BENTYL) 20 MG tablet Take 1 tablet (20 mg total) by mouth every 6 (six) hours as needed (Abdominal Cramping/Pain). 06/23/17   Hubbard Hartshorn, FNP  DULoxetine (CYMBALTA) 60 MG capsule Take 120 mg by mouth at bedtime.     [provider]  FLOVENT HFA 110 MCG/ACT inhaler INHALE 2 PUFFS PO BID 07/28/15   [provider]  fluticasone (FLONASE) 50 MCG/ACT nasal spray USE 2 SPRAYS IN EACH  NOSTRIL DAILY 09/12/17   Steele Sizer, MD  gabapentin (NEURONTIN) 300 MG capsule Take 1 capsule by mouth 3 (three) times daily. 10/30/17   [provider]  gentamicin (GARAMYCIN) 0.3 % ophthalmic solution Place 2 drops into both eyes 4 (four) times daily. 04/28/17   Steele Sizer, MD  hydroxychloroquine (PLAQUENIL) 200 MG tablet Take 200 mg by mouth 2 (two) times daily.    [provider]  lamoTRIgine (LAMICTAL) 200 MG tablet Take 1 tablet (200  mg total) by mouth 2 (two) times daily. 07/31/17   Steele Sizer, MD  meclizine (ANTIVERT) 25 MG tablet TAKE 1 TABLET BY MOUTH 3  TIMES DAILY AS NEEDED 09/12/17   Steele Sizer, MD  metFORMIN (GLUCOPHAGE-XR) 500 MG 24 hr tablet Take 1 tablet (500 mg total) by mouth daily with breakfast. 10/31/17   Steele Sizer, MD  Multiple Vitamin (MULTIVITAMIN) tablet Take 1 tablet by mouth daily.    [provider]  mycophenolate (CELLCEPT) 500 MG tablet Take 2 tablets (1,000 mg total) by mouth 2 (two) times daily. 06/23/17   Hubbard Hartshorn, FNP  omeprazole (PRILOSEC) 40 MG capsule TAKE 1 CAPSULE BY MOUTH  DAILY 07/21/17   Ancil Boozer, Drue Stager, MD  ondansetron (ZOFRAN) 4 MG tablet TAKE 1 TABLET BY MOUTH  EVERY 8 HOURS AS NEEDED FOR NAUSEA AND VOMITING 09/12/17   Ancil Boozer, Drue Stager, MD  polyethylene glycol powder (GLYCOLAX/MIRALAX) powder 2 cap fulls  in a full glass of water, three times a day, for 5 days. 07/31/17   Steele Sizer, MD  prednisoLONE acetate (PRED FORTE) 1 % ophthalmic suspension Place 1 drop into both eyes 2 (two) times daily as needed. 08/04/17   [provider]  predniSONE (DELTASONE) 2.5 MG tablet Take 7.5 mg by mouth daily. 10/17/17   [provider]  ranitidine (ZANTAC) 150 MG tablet Take 1 tablet by mouth 2 (two) times daily. 09/11/17   [provider]  topiramate (TOPAMAX) 50 MG tablet Take 1 tablet by mouth 3 (three) times daily. Dr. [place] James - Pain management 01/04/16   Jodell Cipro, MD  traZODone (DESYREL) 100 MG tablet Take 1 tablet by mouth at bedtime. 12/27/16   [provider]  triamcinolone ointment (KENALOG) 0.1 %  10/26/16   [provider]  valsartan-hydrochlorothiazide (DIOVAN-HCT) 80-12.5 MG tablet Take 1 tablet by mouth daily. Medication changed because of recall 10/26/17   Steele Sizer, MD  YUVAFEM 10 MCG TABS vaginal tablet Place 1 tablet vaginally once a week. 07/04/17   [provider]    Allergies Ace  inhibitors and Penicillins    Social History Social History   Tobacco Use  . Smoking status: Never Smoker  . Smokeless tobacco: Never Used  Substance Use Topics  . Alcohol use: No    Alcohol/week: 0.0 oz  . Drug use: No    Review of Systems Patient denies headaches, rhinorrhea, blurry vision, numbness, shortness of breath, chest pain, edema, cough, abdominal pain, nausea, vomiting, diarrhea, dysuria, fevers, rashes or hallucinations unless otherwise stated above in HPI. ____________________________________________   PHYSICAL EXAM:  VITAL SIGNS: Vitals:   11/09/17 2055 11/09/17 2056  BP: (!) 157/79 (!) 157/79  Pulse:  74  Resp:  16  Temp:    SpO2:  96%    Constitutional: Alert and oriented. Well appearing and in no acute distress. Eyes: Conjunctivae are normal.  Head: Atraumatic. Nose: No congestion/rhinnorhea. Mouth/Throat: Mucous membranes are moist.   Neck: No stridor. Painless ROM.  Cardiovascular: Normal rate, regular rhythm. Grossly normal heart sounds.  Good peripheral circulation. Respiratory: Normal respiratory effort.  No retractions. Lungs CTAB. Gastrointestinal: Soft and nontender. No distention. No abdominal bruits. No CVA tenderness. Genitourinary:  Musculoskeletal: No lower extremity tenderness nor edema.  No joint effusions. Neurologic:  Normal speech and language. No gross focal neurologic deficits are appreciated. No facial droop Skin:  Skin is warm, dry and intact. No rash noted. Psychiatric: Mood and affect are normal. Speech and behavior are normal.  ____________________________________________   LABS (all labs ordered are listed, but only abnormal results are displayed)  Results for orders placed or performed during the hospital encounter of 11/09/17 (from the past 24 hour(s))  CBC with Differential     Status: None   Collection Time: 11/09/17  7:01 PM  Result Value Ref Range   WBC 6.4 3.6 - 11.0 K/uL   RBC 5.02 3.80 - 5.20 MIL/uL    Hemoglobin 13.7 12.0 - 16.0 g/dL   HCT 41.8 35.0 - 47.0 %   MCV 83.2 80.0 - 100.0 fL   MCH 27.3 26.0 - 34.0 pg   MCHC 32.8 32.0 - 36.0 g/dL   RDW 14.5 11.5 - 14.5 %   Platelets 329 150 - 440 K/uL   Neutrophils Relative % 70 %   Neutro Abs 4.5 1.4 - 6.5 K/uL   Lymphocytes Relative 19 %   Lymphs Abs 1.2 1.0 - 3.6 K/uL   Monocytes Relative  9 %   Monocytes Absolute 0.6 0.2 - 0.9 K/uL   Eosinophils Relative 1 %   Eosinophils Absolute 0.1 0 - 0.7 K/uL   Basophils Relative 1 %   Basophils Absolute 0.0 0 - 0.1 K/uL  Basic metabolic panel     Status: Abnormal   Collection Time: 11/09/17  7:01 PM  Result Value Ref Range   Sodium 136 135 - 145 mmol/L   Potassium 4.0 3.5 - 5.1 mmol/L   Chloride 99 (L) 101 - 111 mmol/L   CO2 28 22 - 32 mmol/L   Glucose, Bld 91 65 - 99 mg/dL   BUN 14 6 - 20 mg/dL   Creatinine, Ser 0.98 0.44 - 1.00 mg/dL   Calcium 9.0 8.9 - 10.3 mg/dL   GFR calc non Af Amer >60 >60 mL/min   GFR calc Af Amer >60 >60 mL/min   Anion gap 9 5 - 15  Troponin I     Status: None   Collection Time: 11/09/17  7:01 PM  Result Value Ref Range   Troponin I <0.03 <0.03 ng/mL   ____________________________________________  EKG My review and personal interpretation at Time: 19:06   Indication: htn  Rate: 80  Rhythm: sinus Axis: normal Other: poor r wave progression, no stemi, normal intervlas ____________________________________________  RADIOLOGY  I personally reviewed all radiographic images ordered to evaluate for the above acute complaints and reviewed radiology reports and findings.  These findings were personally discussed with the patient.  Please see medical record for radiology report.  ____________________________________________   PROCEDURES  Procedure(s) performed:  Procedures    Critical Care performed: no ____________________________________________   INITIAL IMPRESSION / ASSESSMENT AND PLAN / ED COURSE  Pertinent labs & imaging results that were  available during my care of the patient were reviewed by me and considered in my medical decision making (see chart for details).  DDX: htn, migraine, tension, sah, acs, chf, dissection, anxiety  Nicole Ballard is a 62 y.o.  Non-distressed patient presenting with concern for elevated BP. Patient is AF,VSS with HTN chcest pain and headache for the past several days. Exam as above. Given current presentation have considered the above differential.  Extensive evaluation of possible end organ damage pursued in ED. No evidence of acute renal dysfunction. Neuro exam without focal deficits. No evidence of sah or cva. EKG without evidence of ischemia. Trop negative. Renal function normal. Not consistent with CHF, malignant htn, adrenergic crisis or hypertensive emergency. Patient appropriate and stable for follow up with PCP for BP recheck. Discussed strict return parameters.  Have discussed with the patient and available family all diagnostics and treatments performed thus far and all questions were answered to the best of my ability. The patient demonstrates understanding and agreement with plan.        ____________________________________________   FINAL CLINICAL IMPRESSION(S) / ED DIAGNOSES  Final diagnoses:  Secondary hypertension  Nonintractable headache, unspecified chronicity pattern, unspecified headache type      NEW MEDICATIONS STARTED DURING THIS VISIT:  New Prescriptions   No medications on file     Note:  This document was prepared using Dragon voice recognition software and may include unintentional dictation errors.    Merlyn Lot, MD 11/09/17 2135

## 2017-11-10 ENCOUNTER — Ambulatory Visit (INDEPENDENT_AMBULATORY_CARE_PROVIDER_SITE_OTHER): Payer: Medicare Other | Admitting: Family Medicine

## 2017-11-10 ENCOUNTER — Encounter: Payer: Self-pay | Admitting: Family Medicine

## 2017-11-10 ENCOUNTER — Other Ambulatory Visit: Payer: Self-pay | Admitting: Family Medicine

## 2017-11-10 VITALS — BP 162/94 | HR 92 | Temp 98.7°F | Resp 16 | Ht 68.0 in | Wt 253.8 lb

## 2017-11-10 DIAGNOSIS — I10 Essential (primary) hypertension: Secondary | ICD-10-CM

## 2017-11-10 DIAGNOSIS — Z72820 Sleep deprivation: Secondary | ICD-10-CM | POA: Diagnosis not present

## 2017-11-10 MED ORDER — LOSARTAN POTASSIUM-HCTZ 100-25 MG PO TABS: 1.0000 | ORAL_TABLET | Freq: Every day | ORAL | 0 refills | Status: DC

## 2017-11-10 MED ORDER — AMLODIPINE BESYLATE 2.5 MG PO TABS: 2.5000 mg | ORAL_TABLET | Freq: Every day | ORAL | 0 refills | Status: DC

## 2017-11-10 NOTE — Progress Notes (Signed)
Name: Nicole Ballard   MRN: [id number]    DOB: 1955/12/30   Date:11/10/2017       Progress Note  Subjective  Chief Complaint  Chief Complaint  Patient presents with  . Hypertension    Patient went to ED due to elevated BP, having dizziness and headaches since she was switched to Valsartan-HCTZ. States Losartan worked well-no symptoms and no side effects.   Marland Kitchen Hospitalization Follow-up    While in ED had labs, EKG and was given Tylenol and Norvasc. Blood work and EKG was Normal    HPI  EC follow up: she went to local pharmacy yesterday because she was feeling tired, headache and some chest pain for the previous 3 days. BP was high therefore pharmacist told her to go to walk in clinic. When she got there they took her to the [facility] At [facility] she had a normal EKG , negative troponin also normal CBC and Basic metabolic panel. She was given a dose of Norvasc 10 mg at 8:55 pm last night.  Today she still feels tired. She states she lives in an apartment and is having difficulty sleeping at night and feels anxious during the day, because there are two children that live above her unit and they make noise all day. They go to bed around 10 pm and wake up early. She has to leave her house during the day to avoid the noise. She is getting frustrated and is only getting about 4-5 hours of sleep. Discussed options. She has to leave on the bottom unit because of chronic pain and risk of falls. There is a possibility of moving to a one bedroom apartment where only adults are allowed. Advised her to try switching to that. Explained that I can write a letter if needed. She had a stress test done 02/2015 that was normal   Patient Active Problem List   Diagnosis Date Noted  . Hyperglycemia 10/23/2017  . Vertigo 09/29/2016  . Bronchiolitis obliterans organizing pneumonia ([place]) 09/29/2016  . Neck pain, musculoskeletal 12/15/2015  . Anxiety about health 09/29/2015  . Abnormal CAT scan 04/11/2015  . Auditory  impairment 04/11/2015  . Insomnia, persistent 04/11/2015  . Chronic kidney disease (CKD), stage I 04/11/2015  . Chronic nonmalignant pain 04/11/2015  . Chronic constipation 04/11/2015  . Dyslipidemia 04/11/2015  . Fibromyalgia 04/11/2015  . Gastro-esophageal reflux disease without esophagitis 04/11/2015  . Dysphonia 04/11/2015  . Absence of bladder continence 04/11/2015  . Low back pain 04/11/2015  . Eczema intertrigo 04/11/2015  . Keratosis pilaris 04/11/2015  . Chronic recurrent major depressive disorder ([place]) 04/11/2015  . Dysmetabolic syndrome 04/18/4817  . Neurogenic claudication 04/11/2015  . Perennial allergic rhinitis with seasonal variation 04/11/2015  . Abnormal presence of protein in urine 04/11/2015  . Seborrhea capitis 04/11/2015  . Moderate dysplasia of vulva 04/11/2015  . Vitamin D deficiency 04/11/2015  . Treadmill stress test negative for angina pectoris 03/05/2015  . Shortness of breath 05/20/2014  . Morbid obesity ([place]) 02/03/2014  . Asthma, chronic 12/31/2013  . H/O total knee replacement 12/31/2013  . Episcleritis 09/26/2013  . Vocal cord dysfunction 05/28/2013  . Anxiety, generalized 03/19/2013  . Back pain 12/18/2012  . Pulmonary nodules 11/26/2012  . Lung nodule, multiple 11/26/2012  . Arthritis, degenerative 11/01/2012  . H/O aspiration pneumonitis 10/22/2012  . Postinflammatory pulmonary fibrosis ([place]) 10/22/2012  . Mixed incontinence 05/04/2012  . Benign neoplasm of pituitary gland and craniopharyngeal duct ([place]) 12/29/2010  . Lung involvement in systemic lupus erythematosus ([place])  11/05/2010  . Chronic nausea 07/01/2008  . Benign hypertension 01/25/2005    Past Surgical History:  Procedure Laterality Date  . BRAIN SURGERY    . CHOLECYSTECTOMY    . VAGINAL HYSTERECTOMY      Family History  Problem Relation Age of Onset  . Breast cancer Mother   . Cancer Mother 46       Breast  . Cancer Father 81       Stomach  . Breast cancer Maternal  Aunt     Social History   Socioeconomic History  . Marital status: Divorced    Spouse name: Not on file  . Number of children: Not on file  . Years of education: Not on file  . Highest education level: Not on file  Social Needs  . Financial resource strain: Not on file  . Food insecurity - worry: Not on file  . Food insecurity - inability: Not on file  . Transportation needs - medical: Not on file  . Transportation needs - non-medical: Not on file  Occupational History  . Not on file  Tobacco Use  . Smoking status: Never Smoker  . Smokeless tobacco: Never Used  Substance and Sexual Activity  . Alcohol use: No    Alcohol/week: 0.0 oz  . Drug use: No  . Sexual activity: Not Currently  Other Topics Concern  . Not on file  Social History Narrative  . Not on file     Current Outpatient Medications:  .  albuterol (PROAIR HFA) 108 (90 BASE) MCG/ACT inhaler, Inhale 2 puffs into the lungs every 6 (six) hours as needed for wheezing. DX code R06.02, Disp: 3 Inhaler, Rfl: 3 .  aspirin EC 81 MG tablet, Take 1 tablet (81 mg total) by mouth daily., Disp: 30 tablet, Rfl: 0 .  atorvastatin (LIPITOR) 40 MG tablet, Take 1 tablet (40 mg total) by mouth daily., Disp: 14 tablet, Rfl: 0 .  azelastine (OPTIVAR) 0.05 % ophthalmic solution, Place 1 drop into both eyes 2 (two) times daily., Disp: 6 mL, Rfl: 1 .  bisacodyl (DULCOLAX) 10 MG suppository, Place rectally., Disp: , Rfl:  .  buPROPion (WELLBUTRIN XL) 300 MG 24 hr tablet, Take 1 tablet by mouth daily., Disp: , Rfl:  .  Cholecalciferol (VITAMIN D) 2000 UNITS CAPS, Take 1 capsule by mouth daily., Disp: , Rfl:  .  clindamycin (CLEOCIN) 300 MG capsule, TK 2 CS PO WITHIN 1 HOUR OF ANY DENTAL PROCEDURE, Disp: , Rfl:  .  clonazePAM (KLONOPIN) 0.5 MG tablet, Take 0.5 tablets (0.25 mg total) by mouth daily., Disp: 15 tablet, Rfl: 0 .  cyclobenzaprine (FLEXERIL) 5 MG tablet, TAKE 1 TABLET BY MOUTH  EVERY 8 HOURS AS NEEDED FOR MUSCLE SPASMS. DO NOT  DRIVE FOR 8 HOURS, Disp: 90 tablet, Rfl: 1 .  diclofenac sodium (VOLTAREN) 1 % GEL, APP 2 GRAMS EXT AA QID, Disp: , Rfl: 6 .  dicyclomine (BENTYL) 20 MG tablet, Take 1 tablet (20 mg total) by mouth every 6 (six) hours as needed (Abdominal Cramping/Pain)., Disp: 15 tablet, Rfl: 0 .  docusate sodium (COLACE) 100 MG capsule, Take by mouth., Disp: , Rfl:  .  DULoxetine (CYMBALTA) 60 MG capsule, Take 120 mg by mouth at bedtime. , Disp: , Rfl:  .  FLOVENT HFA 110 MCG/ACT inhaler, INHALE 2 PUFFS PO BID, Disp: , Rfl: 0 .  fluticasone (FLONASE) 50 MCG/ACT nasal spray, USE 2 SPRAYS IN EACH  NOSTRIL DAILY, Disp: 48 g, Rfl: 1 .  gabapentin (  NEURONTIN) 300 MG capsule, Take 1 capsule by mouth 3 (three) times daily., Disp: , Rfl:  .  gentamicin (GARAMYCIN) 0.3 % ophthalmic solution, Place 2 drops into both eyes 4 (four) times daily., Disp: 5 mL, Rfl: 0 .  hydroxychloroquine (PLAQUENIL) 200 MG tablet, Take 200 mg by mouth 2 (two) times daily., Disp: , Rfl:  .  lamoTRIgine (LAMICTAL) 200 MG tablet, Take 1 tablet (200 mg total) by mouth 2 (two) times daily., Disp: 60 tablet, Rfl: 0 .  meclizine (ANTIVERT) 25 MG tablet, TAKE 1 TABLET BY MOUTH 3  TIMES DAILY AS NEEDED, Disp: 30 tablet, Rfl: 0 .  metFORMIN (GLUCOPHAGE-XR) 500 MG 24 hr tablet, Take 1 tablet (500 mg total) by mouth daily with breakfast., Disp: 90 tablet, Rfl: 1 .  Multiple Vitamin (MULTIVITAMIN) tablet, Take 1 tablet by mouth daily., Disp: , Rfl:  .  mycophenolate (CELLCEPT) 500 MG tablet, Take 2 tablets (1,000 mg total) by mouth 2 (two) times daily., Disp: 5 tablet, Rfl: 0 .  olopatadine (PATANOL) 0.1 % ophthalmic solution, Place 1 drop into both eyes 2 (two) times daily., Disp: , Rfl:  .  omeprazole (PRILOSEC) 40 MG capsule, TAKE 1 CAPSULE BY MOUTH  DAILY, Disp: 90 capsule, Rfl: 1 .  ondansetron (ZOFRAN) 4 MG tablet, TAKE 1 TABLET BY MOUTH  EVERY 8 HOURS AS NEEDED FOR NAUSEA AND VOMITING, Disp: 60 tablet, Rfl: 0 .  polyethylene glycol powder  (GLYCOLAX/MIRALAX) powder, 2 cap fulls in a full glass of water, three times a day, for 5 days., Disp: 850 g, Rfl: 0 .  prednisoLONE acetate (PRED FORTE) 1 % ophthalmic suspension, Place 1 drop into both eyes 2 (two) times daily as needed., Disp: , Rfl: 0 .  predniSONE (DELTASONE) 2.5 MG tablet, Take 7.5 mg by mouth daily., Disp: , Rfl: 0 .  promethazine (PHENERGAN) 25 MG tablet, , Disp: , Rfl:  .  ranitidine (ZANTAC) 150 MG tablet, Take 1 tablet by mouth 2 (two) times daily., Disp: , Rfl:  .  topiramate (TOPAMAX) 50 MG tablet, Take 1 tablet by mouth 3 (three) times daily. Dr. [place] James - Pain management, Disp: , Rfl:  .  traZODone (DESYREL) 100 MG tablet, Take 1 tablet by mouth at bedtime., Disp: , Rfl:  .  triamcinolone ointment (KENALOG) 0.1 %, , Disp: , Rfl:  .  YUVAFEM 10 MCG TABS vaginal tablet, Place 1 tablet vaginally once a week., Disp: , Rfl:  .  amLODipine (NORVASC) 2.5 MG tablet, Take 1 tablet (2.5 mg total) by mouth daily., Disp: 30 tablet, Rfl: 0 .  losartan-hydrochlorothiazide (HYZAAR) 100-25 MG tablet, Take 1 tablet by mouth daily., Disp: 30 tablet, Rfl: 0  Allergies  Allergen Reactions  . Ace Inhibitors     Other reaction(s): OTHER Pt states she can not take ace inhibitors. Pt states she can not remember her reaction.   Marland Kitchen Penicillins      ROS  Ten systems reviewed and is negative except as mentioned in HPI   Objective  Vitals:   11/10/17 0919  BP: (!) 162/94  Pulse: 92  Resp: 16  Temp: 98.7 F (37.1 C)  TempSrc: Oral  SpO2: 95%  Weight: 253 lb 12.8 oz (115.1 kg)  Height: _0  (1.727 m)    Body mass index is 38.59 kg/m.  Physical Exam  Constitutional: Patient appears well-developed and well-nourished. Obese  No distress.  HEENT: head atraumatic, normocephalic, pupils equal and reactive to light,  neck supple, throat within normal limits Cardiovascular: Normal  rate, regular rhythm and normal heart sounds.  No murmur heard. No BLE  edema. Pulmonary/Chest: Effort normal and breath sounds normal. No respiratory distress. Abdominal: Soft.  There is no tenderness. Psychiatric: Patient has a normal mood and affect. behavior is normal. Judgment and thought content normal. Neurological: no focal findings.   Recent Results (from the past 2160 hour(s))  Lipid panel     Status: Abnormal   Collection Time: 10/31/17  8:59 AM  Result Value Ref Range   Cholesterol 220 (H) <200 mg/dL   HDL 62 >50 mg/dL   Triglycerides 101 <150 mg/dL   LDL Cholesterol (Calc) 138 (H) mg/dL (calc)    Comment: Reference range: <100 . Desirable range <100 mg/dL for primary prevention;   <70 mg/dL for patients with CHD or diabetic patients  with > or = 2 CHD risk factors. Marland Kitchen LDL-C is now calculated using the Martin-Hopkins  calculation, which is a validated novel method providing  better accuracy than the Friedewald equation in the  estimation of LDL-C.  Cresenciano Genre et al. Annamaria Helling. 7510;258(52): 2061-2068  (http://education.[facility].com/faq/FAQ164)    Total CHOL/HDL Ratio 3.5 <5.0 (calc)   Non-HDL Cholesterol (Calc) 158 (H) <130 mg/dL (calc)    Comment: For patients with diabetes plus 1 major ASCVD risk  factor, treating to a non-HDL-C goal of <100 mg/dL  (LDL-C of <70 mg/dL) is considered a therapeutic  option.   Comprehensive metabolic panel     Status: None   Collection Time: 10/31/17  8:59 AM  Result Value Ref Range   Glucose, Bld 95 65 - 99 mg/dL    Comment: .            Fasting reference interval .    BUN 11 7 - 25 mg/dL   Creat 0.90 0.50 - 1.05 mg/dL    Comment: For patients >82 years of age, the reference limit for Creatinine is approximately 13% higher for people identified as African-American. .    BUN/Creatinine Ratio NOT APPLICABLE 6 - 22 (calc)   Sodium 140 135 - 146 mmol/L   Potassium 4.7 3.5 - 5.3 mmol/L   Chloride 103 98 - 110 mmol/L   CO2 31 20 - 32 mmol/L   Calcium 9.6 8.6 - 10.4 mg/dL   Total Protein 7.6  6.1 - 8.1 g/dL   Albumin 3.9 3.6 - 5.1 g/dL   Globulin 3.7 1.9 - 3.7 g/dL (calc)   AG Ratio 1.1 1.0 - 2.5 (calc)   Total Bilirubin 0.3 0.2 - 1.2 mg/dL   Alkaline phosphatase (APISO) 66 33 - 130 U/L   AST 19 10 - 35 U/L   ALT 13 6 - 29 U/L  Microalbumin / creatinine urine ratio     Status: None   Collection Time: 10/31/17  8:59 AM  Result Value Ref Range   Creatinine, Urine 130 20 - 275 mg/dL   Microalb, Ur 1.8 mg/dL    Comment: Reference Range Not established    Microalb Creat Ratio 14 <30 mcg/mg creat    Comment: . The ADA defines abnormalities in albumin excretion as follows: Marland Kitchen Category         Result (mcg/mg creatinine) . Normal                    <30 Microalbuminuria         30-299  Clinical albuminuria   > OR = 300 . The ADA recommends that at least two of three specimens collected within a 3-6 month period be  abnormal before considering a patient to be within a diagnostic category.   CBC with Differential     Status: None   Collection Time: 11/09/17  7:01 PM  Result Value Ref Range   WBC 6.4 3.6 - 11.0 K/uL   RBC 5.02 3.80 - 5.20 MIL/uL   Hemoglobin 13.7 12.0 - 16.0 g/dL   HCT 41.8 35.0 - 47.0 %   MCV 83.2 80.0 - 100.0 fL   MCH 27.3 26.0 - 34.0 pg   MCHC 32.8 32.0 - 36.0 g/dL   RDW 14.5 11.5 - 14.5 %   Platelets 329 150 - 440 K/uL   Neutrophils Relative % 70 %   Neutro Abs 4.5 1.4 - 6.5 K/uL   Lymphocytes Relative 19 %   Lymphs Abs 1.2 1.0 - 3.6 K/uL   Monocytes Relative 9 %   Monocytes Absolute 0.6 0.2 - 0.9 K/uL   Eosinophils Relative 1 %   Eosinophils Absolute 0.1 0 - 0.7 K/uL   Basophils Relative 1 %   Basophils Absolute 0.0 0 - 0.1 K/uL    Comment: Performed at [facility], [address] [place], [place] [address]  Basic metabolic panel     Status: Abnormal   Collection Time: 11/09/17  7:01 PM  Result Value Ref Range   Sodium 136 135 - 145 mmol/L   Potassium 4.0 3.5 - 5.1 mmol/L   Chloride 99 (L) 101 - 111 mmol/L   CO2 28 22 - 32  mmol/L   Glucose, Bld 91 65 - 99 mg/dL   BUN 14 6 - 20 mg/dL   Creatinine, Ser 0.98 0.44 - 1.00 mg/dL   Calcium 9.0 8.9 - 10.3 mg/dL   GFR calc non Af Amer >60 >60 mL/min   GFR calc Af Amer >60 >60 mL/min    Comment: (NOTE) The eGFR has been calculated using the CKD EPI equation. This calculation has not been validated in all clinical situations. eGFR's persistently <60 mL/min signify possible Chronic Kidney Disease.    Anion gap 9 5 - 15    Comment: Performed at [facility], [address] [place], [place] [address]  Troponin I     Status: None   Collection Time: 11/09/17  7:01 PM  Result Value Ref Range   Troponin I <0.03 <0.03 ng/mL    Comment: Performed at [facility], [address] [place], [place] [address]    PHQ2/9: Depression screen [facility] 2/9 04/28/2017 10/31/2016 07/12/2016 04/29/2016 01/28/2016  Decreased Interest 0 0 0 0 0  Down, Depressed, Hopeless 1 0 0 1 0  PHQ - 2 Score 1 0 0 1 0    Fall Risk: Fall Risk  07/07/2017 04/28/2017 10/31/2016 07/12/2016 04/29/2016  Falls in the past year? No No Yes No No  Number falls in past yr: - - 1 - -  Injury with Fall? - - No - -      Assessment & Plan  1. Uncontrolled hypertension  She would like to go back on losartan hctz, we will increase dose and also add low dose norvasc, explained that lack of sleep is not healthy. Advised to try going to daughter's home for a couple of days and nights to see if bp normalizes.  - losartan-hydrochlorothiazide (HYZAAR) 100-25 MG tablet; Take 1 tablet by mouth daily.  Dispense: 30 tablet; Refill: 0 - amLODipine (NORVASC) 2.5 MG tablet; Take 1 tablet (2.5 mg total) by mouth daily.  Dispense: 30 tablet; Refill: 0   2. Lack  of adequate sleep  She will try going to daughter's house and check bp to see if normalizes

## 2017-11-16 ENCOUNTER — Encounter: Payer: Self-pay | Admitting: Family Medicine

## 2017-11-16 ENCOUNTER — Ambulatory Visit (INDEPENDENT_AMBULATORY_CARE_PROVIDER_SITE_OTHER): Payer: Medicare Other | Admitting: Family Medicine

## 2017-11-16 VITALS — BP 136/84 | HR 89 | Temp 98.0°F | Resp 16 | Ht 68.0 in | Wt 251.0 lb

## 2017-11-16 DIAGNOSIS — R51 Headache: Secondary | ICD-10-CM

## 2017-11-16 DIAGNOSIS — I10 Essential (primary) hypertension: Secondary | ICD-10-CM | POA: Diagnosis not present

## 2017-11-16 DIAGNOSIS — R519 Headache, unspecified: Secondary | ICD-10-CM

## 2017-11-16 MED ORDER — AMLODIPINE BESYLATE 2.5 MG PO TABS: 2.5000 mg | ORAL_TABLET | Freq: Every day | ORAL | 0 refills | Status: DC

## 2017-11-16 MED ORDER — LOSARTAN POTASSIUM-HCTZ 100-25 MG PO TABS: 1.0000 | ORAL_TABLET | Freq: Every day | ORAL | 0 refills | Status: DC

## 2017-11-16 MED ORDER — TELMISARTAN-HCTZ 80-25 MG PO TABS: 1.0000 | ORAL_TABLET | Freq: Every day | ORAL | 0 refills | Status: DC

## 2017-11-16 NOTE — Progress Notes (Signed)
Name: Nicole Ballard   MRN: [id number]    DOB: March 03, 1956   Date:11/16/2017       Progress Note  Subjective  Chief Complaint  Chief Complaint  Patient presents with  . Follow-up    1 week F/U  . Hypertension    Changed BP medication back to Losartan doing well-BP has gone back down to 130/80's only symptoms is headaches.    HPI  HTN: she went back on losartan/hctz and also amlodipine and is doing well, however losartan recalled again, so we will try Micardis hctz. No chest pain or palpitation  Headaches chronic: going on for a couple months, needs to take tylenol daily, she states aggravated by neighbors and cannot sleep well at night. Advised to avoid pain medication, try to sleep at kids house to get some rest, use cold compresses and meditation. Pain level is 6/10 , aching like and frontal , temporal or nuchal area.    Patient Active Problem List   Diagnosis Date Noted  . Hyperglycemia 10/23/2017  . Vertigo 09/29/2016  . Bronchiolitis obliterans organizing pneumonia ([place]) 09/29/2016  . Neck pain, musculoskeletal 12/15/2015  . Anxiety about health 09/29/2015  . Abnormal CAT scan 04/11/2015  . Auditory impairment 04/11/2015  . Insomnia, persistent 04/11/2015  . Chronic kidney disease (CKD), stage I 04/11/2015  . Chronic nonmalignant pain 04/11/2015  . Chronic constipation 04/11/2015  . Dyslipidemia 04/11/2015  . Fibromyalgia 04/11/2015  . Gastro-esophageal reflux disease without esophagitis 04/11/2015  . Dysphonia 04/11/2015  . Absence of bladder continence 04/11/2015  . Low back pain 04/11/2015  . Eczema intertrigo 04/11/2015  . Keratosis pilaris 04/11/2015  . Chronic recurrent major depressive disorder ([place]) 04/11/2015  . Dysmetabolic syndrome 32/67/1245  . Neurogenic claudication 04/11/2015  . Perennial allergic rhinitis with seasonal variation 04/11/2015  . Abnormal presence of protein in urine 04/11/2015  . Seborrhea capitis 04/11/2015  . Moderate dysplasia of  vulva 04/11/2015  . Vitamin D deficiency 04/11/2015  . Treadmill stress test negative for angina pectoris 03/05/2015  . Shortness of breath 05/20/2014  . Morbid obesity ([place]) 02/03/2014  . Asthma, chronic 12/31/2013  . H/O total knee replacement 12/31/2013  . Episcleritis 09/26/2013  . Vocal cord dysfunction 05/28/2013  . Anxiety, generalized 03/19/2013  . Back pain 12/18/2012  . Pulmonary nodules 11/26/2012  . Lung nodule, multiple 11/26/2012  . Arthritis, degenerative 11/01/2012  . H/O aspiration pneumonitis 10/22/2012  . Postinflammatory pulmonary fibrosis ([place]) 10/22/2012  . Mixed incontinence 05/04/2012  . Benign neoplasm of pituitary gland and craniopharyngeal duct ([place]) 12/29/2010  . Lung involvement in systemic lupus erythematosus ([place]) 11/05/2010  . Chronic nausea 07/01/2008  . Benign hypertension 01/25/2005    Past Surgical History:  Procedure Laterality Date  . BRAIN SURGERY    . CHOLECYSTECTOMY    . VAGINAL HYSTERECTOMY      Family History  Problem Relation Age of Onset  . Breast cancer Mother   . Cancer Mother 69       Breast  . Cancer Father 29       Stomach  . Breast cancer Maternal Aunt     Social History   Socioeconomic History  . Marital status: Divorced    Spouse name: Not on file  . Number of children: Not on file  . Years of education: Not on file  . Highest education level: Not on file  Social Needs  . Financial resource strain: Not on file  . Food insecurity - worry: Not on file  . Food  insecurity - inability: Not on file  . Transportation needs - medical: Not on file  . Transportation needs - non-medical: Not on file  Occupational History  . Not on file  Tobacco Use  . Smoking status: Never Smoker  . Smokeless tobacco: Never Used  Substance and Sexual Activity  . Alcohol use: No    Alcohol/week: 0.0 oz  . Drug use: No  . Sexual activity: Not Currently  Other Topics Concern  . Not on file  Social History Narrative  . Not on  file     Current Outpatient Medications:  .  albuterol (PROAIR HFA) 108 (90 BASE) MCG/ACT inhaler, Inhale 2 puffs into the lungs every 6 (six) hours as needed for wheezing. DX code R06.02, Disp: 3 Inhaler, Rfl: 3 .  amLODipine (NORVASC) 2.5 MG tablet, Take 1 tablet (2.5 mg total) by mouth daily., Disp: 30 tablet, Rfl: 0 .  aspirin EC 81 MG tablet, Take 1 tablet (81 mg total) by mouth daily., Disp: 30 tablet, Rfl: 0 .  atorvastatin (LIPITOR) 40 MG tablet, Take 1 tablet (40 mg total) by mouth daily., Disp: 14 tablet, Rfl: 0 .  azelastine (OPTIVAR) 0.05 % ophthalmic solution, Place 1 drop into both eyes 2 (two) times daily., Disp: 6 mL, Rfl: 1 .  bisacodyl (DULCOLAX) 10 MG suppository, Place rectally., Disp: , Rfl:  .  buPROPion (WELLBUTRIN XL) 300 MG 24 hr tablet, Take 1 tablet by mouth daily., Disp: , Rfl:  .  Cholecalciferol (VITAMIN D) 2000 UNITS CAPS, Take 1 capsule by mouth daily., Disp: , Rfl:  .  clindamycin (CLEOCIN) 300 MG capsule, TK 2 CS PO WITHIN 1 HOUR OF ANY DENTAL PROCEDURE, Disp: , Rfl:  .  clonazePAM (KLONOPIN) 0.5 MG tablet, Take 0.5 tablets (0.25 mg total) by mouth daily., Disp: 15 tablet, Rfl: 0 .  cyclobenzaprine (FLEXERIL) 5 MG tablet, TAKE 1 TABLET BY MOUTH  EVERY 8 HOURS AS NEEDED FOR MUSCLE SPASMS. DO NOT DRIVE FOR 8 HOURS, Disp: 90 tablet, Rfl: 1 .  diclofenac sodium (VOLTAREN) 1 % GEL, APP 2 GRAMS EXT AA QID, Disp: , Rfl: 6 .  dicyclomine (BENTYL) 20 MG tablet, Take 1 tablet (20 mg total) by mouth every 6 (six) hours as needed (Abdominal Cramping/Pain)., Disp: 15 tablet, Rfl: 0 .  docusate sodium (COLACE) 100 MG capsule, Take by mouth., Disp: , Rfl:  .  DULoxetine (CYMBALTA) 60 MG capsule, Take 120 mg by mouth at bedtime. , Disp: , Rfl:  .  FLOVENT HFA 110 MCG/ACT inhaler, INHALE 2 PUFFS PO BID, Disp: , Rfl: 0 .  fluticasone (FLONASE) 50 MCG/ACT nasal spray, USE 2 SPRAYS IN EACH  NOSTRIL DAILY, Disp: 48 g, Rfl: 1 .  gabapentin (NEURONTIN) 300 MG capsule, Take 1 capsule  by mouth 3 (three) times daily., Disp: , Rfl:  .  gentamicin (GARAMYCIN) 0.3 % ophthalmic solution, Place 2 drops into both eyes 4 (four) times daily., Disp: 5 mL, Rfl: 0 .  hydroxychloroquine (PLAQUENIL) 200 MG tablet, Take 200 mg by mouth 2 (two) times daily., Disp: , Rfl:  .  lamoTRIgine (LAMICTAL) 200 MG tablet, Take 1 tablet (200 mg total) by mouth 2 (two) times daily., Disp: 60 tablet, Rfl: 0 .  losartan-hydrochlorothiazide (HYZAAR) 100-25 MG tablet, Take 1 tablet by mouth daily., Disp: 30 tablet, Rfl: 0 .  meclizine (ANTIVERT) 25 MG tablet, TAKE 1 TABLET BY MOUTH 3  TIMES DAILY AS NEEDED, Disp: 30 tablet, Rfl: 0 .  metFORMIN (GLUCOPHAGE-XR) 500 MG 24 hr tablet,  Take 1 tablet (500 mg total) by mouth daily with breakfast., Disp: 90 tablet, Rfl: 1 .  Multiple Vitamin (MULTIVITAMIN) tablet, Take 1 tablet by mouth daily., Disp: , Rfl:  .  mycophenolate (CELLCEPT) 500 MG tablet, Take 2 tablets (1,000 mg total) by mouth 2 (two) times daily., Disp: 5 tablet, Rfl: 0 .  olopatadine (PATANOL) 0.1 % ophthalmic solution, Place 1 drop into both eyes 2 (two) times daily., Disp: , Rfl:  .  omeprazole (PRILOSEC) 40 MG capsule, TAKE 1 CAPSULE BY MOUTH  DAILY, Disp: 90 capsule, Rfl: 1 .  ondansetron (ZOFRAN) 4 MG tablet, TAKE 1 TABLET BY MOUTH  EVERY 8 HOURS AS NEEDED FOR NAUSEA AND VOMITING, Disp: 60 tablet, Rfl: 0 .  polyethylene glycol powder (GLYCOLAX/MIRALAX) powder, 2 cap fulls in a full glass of water, three times a day, for 5 days., Disp: 850 g, Rfl: 0 .  prednisoLONE acetate (PRED FORTE) 1 % ophthalmic suspension, Place 1 drop into both eyes 2 (two) times daily as needed., Disp: , Rfl: 0 .  predniSONE (DELTASONE) 2.5 MG tablet, Take 7.5 mg by mouth daily., Disp: , Rfl: 0 .  promethazine (PHENERGAN) 25 MG tablet, , Disp: , Rfl:  .  ranitidine (ZANTAC) 150 MG tablet, Take 1 tablet by mouth 2 (two) times daily., Disp: , Rfl:  .  topiramate (TOPAMAX) 50 MG tablet, Take 1 tablet by mouth 3 (three) times  daily. Dr. [place] James - Pain management, Disp: , Rfl:  .  traZODone (DESYREL) 100 MG tablet, Take 1 tablet by mouth at bedtime., Disp: , Rfl:  .  triamcinolone ointment (KENALOG) 0.1 %, , Disp: , Rfl:  .  YUVAFEM 10 MCG TABS vaginal tablet, Place 1 tablet vaginally once a week., Disp: , Rfl:   Allergies  Allergen Reactions  . Ace Inhibitors     Other reaction(s): OTHER Pt states she can not take ace inhibitors. Pt states she can not remember her reaction.   Marland Kitchen Penicillins      ROS  Ten systems reviewed and is negative except as mentioned in HPI   Objective  Vitals:   11/16/17 1150  BP: 136/84  Pulse: 89  Resp: 16  Temp: 98 F (36.7 C)  TempSrc: Oral  SpO2: 96%  Weight: 251 lb (113.9 kg)  Height: '5\' 8"'$  (1.727 m)    Body mass index is 38.16 kg/m.  Physical Exam  Constitutional: Patient appears well-developed and well-nourished. Obese  No distress.  HEENT: head atraumatic, normocephalic, pupils equal and reactive to light, eneck supple, throat within normal limits Cardiovascular: Normal rate, regular rhythm and normal heart sounds.  No murmur heard. No BLE edema. Pulmonary/Chest: Effort normal and breath sounds normal. No respiratory distress. Abdominal: Soft.  There is no tenderness. Psychiatric: Patient has a normal mood and affect. behavior is normal. Judgment and thought content normal.  Recent Results (from the past 2160 hour(s))  Lipid panel     Status: Abnormal   Collection Time: 10/31/17  8:59 AM  Result Value Ref Range   Cholesterol 220 (H) <200 mg/dL   HDL 62 >50 mg/dL   Triglycerides 101 <150 mg/dL   LDL Cholesterol (Calc) 138 (H) mg/dL (calc)    Comment: Reference range: <100 . Desirable range <100 mg/dL for primary prevention;   <70 mg/dL for patients with CHD or diabetic patients  with > or = 2 CHD risk factors. Marland Kitchen LDL-C is now calculated using the Martin-Hopkins  calculation, which is a validated novel method providing  better  accuracy than  the Friedewald equation in the  estimation of LDL-C.  Cresenciano Genre et al. Annamaria Helling. 3818;299(37): 2061-2068  (http://education.[employer].com/faq/FAQ164)    Total CHOL/HDL Ratio 3.5 <5.0 (calc)   Non-HDL Cholesterol (Calc) 158 (H) <130 mg/dL (calc)    Comment: For patients with diabetes plus 1 major ASCVD risk  factor, treating to a non-HDL-C goal of <100 mg/dL  (LDL-C of <70 mg/dL) is considered a therapeutic  option.   Comprehensive metabolic panel     Status: None   Collection Time: 10/31/17  8:59 AM  Result Value Ref Range   Glucose, Bld 95 65 - 99 mg/dL    Comment: .            Fasting reference interval .    BUN 11 7 - 25 mg/dL   Creat 0.90 0.50 - 1.05 mg/dL    Comment: For patients >18 years of age, the reference limit for Creatinine is approximately 13% higher for people identified as African-American. .    BUN/Creatinine Ratio NOT APPLICABLE 6 - 22 (calc)   Sodium 140 135 - 146 mmol/L   Potassium 4.7 3.5 - 5.3 mmol/L   Chloride 103 98 - 110 mmol/L   CO2 31 20 - 32 mmol/L   Calcium 9.6 8.6 - 10.4 mg/dL   Total Protein 7.6 6.1 - 8.1 g/dL   Albumin 3.9 3.6 - 5.1 g/dL   Globulin 3.7 1.9 - 3.7 g/dL (calc)   AG Ratio 1.1 1.0 - 2.5 (calc)   Total Bilirubin 0.3 0.2 - 1.2 mg/dL   Alkaline phosphatase (APISO) 66 33 - 130 U/L   AST 19 10 - 35 U/L   ALT 13 6 - 29 U/L  Microalbumin / creatinine urine ratio     Status: None   Collection Time: 10/31/17  8:59 AM  Result Value Ref Range   Creatinine, Urine 130 20 - 275 mg/dL   Microalb, Ur 1.8 mg/dL    Comment: Reference Range Not established    Microalb Creat Ratio 14 <30 mcg/mg creat    Comment: . The ADA defines abnormalities in albumin excretion as follows: Marland Kitchen Category         Result (mcg/mg creatinine) . Normal                    <30 Microalbuminuria         30-299  Clinical albuminuria   > OR = 300 . The ADA recommends that at least two of three specimens collected within a 3-6 month period be abnormal before  considering a patient to be within a diagnostic category.   CBC with Differential     Status: None   Collection Time: 11/09/17  7:01 PM  Result Value Ref Range   WBC 6.4 3.6 - 11.0 K/uL   RBC 5.02 3.80 - 5.20 MIL/uL   Hemoglobin 13.7 12.0 - 16.0 g/dL   HCT 41.8 35.0 - 47.0 %   MCV 83.2 80.0 - 100.0 fL   MCH 27.3 26.0 - 34.0 pg   MCHC 32.8 32.0 - 36.0 g/dL   RDW 14.5 11.5 - 14.5 %   Platelets 329 150 - 440 K/uL   Neutrophils Relative % 70 %   Neutro Abs 4.5 1.4 - 6.5 K/uL   Lymphocytes Relative 19 %   Lymphs Abs 1.2 1.0 - 3.6 K/uL   Monocytes Relative 9 %   Monocytes Absolute 0.6 0.2 - 0.9 K/uL   Eosinophils Relative 1 %   Eosinophils Absolute 0.1  0 - 0.7 K/uL   Basophils Relative 1 %   Basophils Absolute 0.0 0 - 0.1 K/uL    Comment: Performed at [facility], [address] [place], [place] [address]  Basic metabolic panel     Status: Abnormal   Collection Time: 11/09/17  7:01 PM  Result Value Ref Range   Sodium 136 135 - 145 mmol/L   Potassium 4.0 3.5 - 5.1 mmol/L   Chloride 99 (L) 101 - 111 mmol/L   CO2 28 22 - 32 mmol/L   Glucose, Bld 91 65 - 99 mg/dL   BUN 14 6 - 20 mg/dL   Creatinine, Ser 0.98 0.44 - 1.00 mg/dL   Calcium 9.0 8.9 - 10.3 mg/dL   GFR calc non Af Amer >60 >60 mL/min   GFR calc Af Amer >60 >60 mL/min    Comment: (NOTE) The eGFR has been calculated using the CKD EPI equation. This calculation has not been validated in all clinical situations. eGFR's persistently <60 mL/min signify possible Chronic Kidney Disease.    Anion gap 9 5 - 15    Comment: Performed at [facility], [address] [place], [place] [address]  Troponin I     Status: None   Collection Time: 11/09/17  7:01 PM  Result Value Ref Range   Troponin I <0.03 <0.03 ng/mL    Comment: Performed at [facility], [address] [place], [place] [address]      PHQ2/9: Depression screen [facility] 2/9 04/28/2017 10/31/2016 07/12/2016 04/29/2016 01/28/2016  Decreased  Interest 0 0 0 0 0  Down, Depressed, Hopeless 1 0 0 1 0  PHQ - 2 Score 1 0 0 1 0     Fall Risk: Fall Risk  07/07/2017 04/28/2017 10/31/2016 07/12/2016 04/29/2016  Falls in the past year? No No Yes No No  Number falls in past yr: - - 1 - -  Injury with Fall? - - No - -    Assessment & Plan  1. Benign hypertension  - telmisartan-hydrochlorothiazide (MICARDIS HCT) 80-25 MG tablet; Take 1 tablet by mouth daily.  Dispense: 90 tablet; Refill: 0 - amLODipine (NORVASC) 2.5 MG tablet; Take 1 tablet (2.5 mg total) by mouth daily.  Dispense: 90 tablet; Refill: 0  2. Chronic daily headache  Still under stress from neighbors, taking Tylenol daily, advised to stop pain medication, try to sleep more and may be having rebound headaches

## 2017-11-16 NOTE — Patient Instructions (Signed)
Tension Headache A tension headache is a feeling of pain, pressure, or aching that is often felt over the front and sides of the head. The pain can be dull, or it can feel tight (constricting). Tension headaches are not normally associated with nausea or vomiting, and they do not get worse with physical activity. Tension headaches can last from 30 minutes to several days. This is the most common type of headache. CAUSES The exact cause of this condition is not known. Tension headaches often begin after stress, anxiety, or depression. Other triggers may include:  Alcohol.  Too much caffeine, or caffeine withdrawal.  Respiratory infections, such as colds, flu, or sinus infections.  Dental problems or teeth clenching.  Fatigue.  Holding your head and neck in the same position for a long period of time, such as while using a computer.  Smoking. SYMPTOMS Symptoms of this condition include:  A feeling of pressure around the head.  Dull, aching head pain.  Pain felt over the front and sides of the head.  Tenderness in the muscles of the head, neck, and shoulders. DIAGNOSIS This condition may be diagnosed based on your symptoms and a physical exam. Tests may be done, such as a CT scan or an MRI of your head. These tests may be done if your symptoms are severe or unusual. TREATMENT This condition may be treated with lifestyle changes and medicines to help relieve symptoms. HOME CARE INSTRUCTIONS Managing Pain  Take over-the-counter and prescription medicines only as told by your health care provider.  Lie down in a dark, quiet room when you have a headache.  If directed, apply ice to the head and neck area: ? Put ice in a plastic bag. ? Place a towel between your skin and the bag. ? Leave the ice on for 20 minutes, 2-3 times per day.  Use a heating pad or a hot shower to apply heat to the head and neck area as told by your health care provider. Eating and Drinking  Eat meals on  a regular schedule.  Limit alcohol use.  Decrease your caffeine intake, or stop using caffeine. General Instructions  Keep all follow-up visits as told by your health care provider. This is important.  Keep a headache journal to help find out what may trigger your headaches. For example, write down: ? What you eat and drink. ? How much sleep you get. ? Any change to your diet or medicines.  Try massage or other relaxation techniques.  Limit stress.  Sit up straight, and avoid tensing your muscles.  Do not use tobacco products, including cigarettes, chewing tobacco, or e-cigarettes. If you need help quitting, ask your health care provider.  Exercise regularly as told by your health care provider.  Get 7-9 hours of sleep, or the amount recommended by your health care provider. SEEK MEDICAL CARE IF:  Your symptoms are not helped by medicine.  You have a headache that is different from what you normally experience.  You have nausea or you vomit.  You have a fever. SEEK IMMEDIATE MEDICAL CARE IF:  Your headache becomes severe.  You have repeated vomiting.  You have a stiff neck.  You have a loss of vision.  You have problems with speech.  You have pain in your eye or ear.  You have muscular weakness or loss of muscle control.  You lose your balance or you have trouble walking.  You feel faint or you pass out.  You have confusion. This information   is not intended to replace advice given to you by your health care provider. Make sure you discuss any questions you have with your health care provider. Document Released: 08/22/2005 Document Revised: 05/13/2015 Document Reviewed: 12/15/2014 Elsevier Interactive Patient Education  2017 [employer]vier Inc.  

## 2017-11-17 ENCOUNTER — Ambulatory Visit: Payer: Medicare Other

## 2017-11-20 ENCOUNTER — Telehealth: Payer: Self-pay | Admitting: Family Medicine

## 2017-11-20 ENCOUNTER — Encounter: Admit: 2017-11-20 | Discharge: 2017-11-21 | Payer: MEDICARE

## 2017-11-20 DIAGNOSIS — M48061 Spinal stenosis, lumbar region without neurogenic claudication: Secondary | ICD-10-CM | POA: Diagnosis not present

## 2017-11-20 DIAGNOSIS — G894 Chronic pain syndrome: Secondary | ICD-10-CM | POA: Diagnosis not present

## 2017-11-20 DIAGNOSIS — M545 Low back pain: Secondary | ICD-10-CM | POA: Diagnosis not present

## 2017-11-20 DIAGNOSIS — G8929 Other chronic pain: Secondary | ICD-10-CM | POA: Diagnosis not present

## 2017-11-20 DIAGNOSIS — M150 Primary generalized (osteo)arthritis: Secondary | ICD-10-CM | POA: Diagnosis not present

## 2017-11-20 DIAGNOSIS — M79641 Pain in right hand: Secondary | ICD-10-CM | POA: Diagnosis not present

## 2017-11-20 DIAGNOSIS — M797 Fibromyalgia: Secondary | ICD-10-CM | POA: Diagnosis not present

## 2017-11-20 DIAGNOSIS — M542 Cervicalgia: Secondary | ICD-10-CM | POA: Diagnosis not present

## 2017-11-20 DIAGNOSIS — M79642 Pain in left hand: Secondary | ICD-10-CM | POA: Diagnosis not present

## 2017-11-20 DIAGNOSIS — Z7952 Long term (current) use of systemic steroids: Secondary | ICD-10-CM | POA: Diagnosis not present

## 2017-11-20 DIAGNOSIS — F329 Major depressive disorder, single episode, unspecified: Secondary | ICD-10-CM

## 2017-11-20 DIAGNOSIS — F411 Generalized anxiety disorder: Secondary | ICD-10-CM

## 2017-11-20 MED ORDER — DICLOFENAC 1 % TOPICAL GEL
Freq: Four times a day (QID) | TOPICAL | 5 refills | 0 days | Status: CP
Start: 2017-11-20 — End: 2018-09-20

## 2017-11-20 MED ORDER — CYCLOBENZAPRINE 5 MG TABLET
ORAL_TABLET | Freq: Two times a day (BID) | ORAL | 2 refills | 0.00000 days | Status: CP | PRN
Start: 2017-11-20 — End: 2018-12-03

## 2017-11-20 MED ORDER — GABAPENTIN 300 MG CAPSULE
ORAL_CAPSULE | Freq: Three times a day (TID) | ORAL | 1 refills | 0 days | Status: CP
Start: 2017-11-20 — End: 2018-12-03

## 2017-11-20 MED ORDER — TOPIRAMATE 50 MG TABLET
ORAL_TABLET | Freq: Two times a day (BID) | ORAL | 1 refills | 0.00000 days | Status: CP
Start: 2017-11-20 — End: 2018-07-09

## 2017-11-20 NOTE — Telephone Encounter (Signed)
Not usually from DM

## 2017-11-20 NOTE — Telephone Encounter (Signed)
Sent to PCP ?

## 2017-11-20 NOTE — Telephone Encounter (Signed)
Copied from [place] [id number]. Topic: Quick Communication - See Telephone Encounter >> Nov 20, 2017 10:18 AM Lolita Rieger, RMA wrote: CRM for notification. See Telephone encounter for:   11/20/17.pt called and stated that she has pain in the palms of both her hands and she wanted to know if the pain could be associated with her DM please call pt @ [phone]

## 2017-11-21 ENCOUNTER — Other Ambulatory Visit: Payer: Self-pay | Admitting: Family Medicine

## 2017-11-21 ENCOUNTER — Encounter: Payer: Self-pay | Admitting: Nurse Practitioner

## 2017-11-21 ENCOUNTER — Ambulatory Visit (INDEPENDENT_AMBULATORY_CARE_PROVIDER_SITE_OTHER): Payer: Medicare Other | Admitting: Nurse Practitioner

## 2017-11-21 DIAGNOSIS — R519 Headache, unspecified: Secondary | ICD-10-CM

## 2017-11-21 DIAGNOSIS — R319 Hematuria, unspecified: Secondary | ICD-10-CM

## 2017-11-21 DIAGNOSIS — G4700 Insomnia, unspecified: Secondary | ICD-10-CM

## 2017-11-21 DIAGNOSIS — I10 Essential (primary) hypertension: Secondary | ICD-10-CM

## 2017-11-21 DIAGNOSIS — E785 Hyperlipidemia, unspecified: Secondary | ICD-10-CM

## 2017-11-21 DIAGNOSIS — F411 Generalized anxiety disorder: Secondary | ICD-10-CM

## 2017-11-21 DIAGNOSIS — M79642 Pain in left hand: Secondary | ICD-10-CM | POA: Diagnosis not present

## 2017-11-21 DIAGNOSIS — R51 Headache: Secondary | ICD-10-CM | POA: Diagnosis not present

## 2017-11-21 DIAGNOSIS — M79641 Pain in right hand: Secondary | ICD-10-CM | POA: Diagnosis not present

## 2017-11-21 DIAGNOSIS — G8929 Other chronic pain: Secondary | ICD-10-CM

## 2017-11-21 LAB — POCT URINALYSIS DIPSTICK
BILIRUBIN UA: NEGATIVE
GLUCOSE UA: NEGATIVE
Ketones, UA: NEGATIVE
Nitrite, UA: NEGATIVE
RBC UA: NEGATIVE
SPEC GRAV UA: 1.02 (ref 1.010–1.025)
UROBILINOGEN UA: 0.2 U/dL
pH, UA: 5.5 (ref 5.0–8.0)

## 2017-11-21 MED ORDER — TELMISARTAN-HCTZ 80-25 MG PO TABS: 1.0000 | ORAL_TABLET | Freq: Every day | ORAL | 0 refills | Status: DC

## 2017-11-21 NOTE — Telephone Encounter (Signed)
Temporary supply had been provided while pt awaiting mail order 90-day supply.  Refill of 14-day supply is denied.

## 2017-11-21 NOTE — Telephone Encounter (Signed)
Patient was seen today by Benjamine Mola NP. Concerns were addressed during visit.

## 2017-11-21 NOTE — Progress Notes (Addendum)
Name: Nicole Ballard   MRN: [id number]    DOB: 03/19/1956   Date:11/21/2017       Progress Note  Subjective  Chief Complaint  Chief Complaint  Patient presents with  . Hand Pain    bilateral in the palms of her hands.  onset this weekend.  She states it hurts worse when she urinates?    HPI  HTN She went back on losartan/hctz and also amlodipine and was doing well, however losartan recalled so she was switched to- Micardis hctz at last visit on 3/14- order was sent to [place] so she has not started taking this medication and was taking her valsartan. Mild occasioanl chest pain-not since ER visit Denies palpitations.  Headaches chronic Has increased for a couple months, needs to take tylenol daily, she states aggravated by neighbors and cannot sleep well at night but today states she can occasionally get a good night sleep sts usually wakes during the night 2-3 times but not every night. Advised to avoid pain medication, try to sleep at kids house to get some rest, use cold compresses and meditation. Pain level is 6/10 , aching like and frontal , temporal or nuchal area. Patient states hasnt taken topamax in over a month.   Hand Pain  bilateral in the palms of her hands-onset this weekend. Sts tender when she pushes on something describes as soreness. Patient sts tried diclofenac gel with some relief of pain. Patient denies injury or trauma, denies weakness. Has never had this pain before. She states it hurts worse when she urinates Patient has seen pain management for this complaint and given referral for ortho.   Urinary urgency Patient endorses urinary urgency but states she is only urinating small amounts. Denies burning or pain but notes when she wiped yesterday noted small amount of blood on tissue. Denies vaginal bleeding.   Morbid Obesity Patient states she is trying to change her unhealthy eating habits- sts she stopped doing sweet cereals and switched to oatmeal.  Stopped buying chips and sweets and gave it to her grandchildren. Patient sts yesterday she joined an all-women's gym.   Patient Active Problem List   Diagnosis Date Noted  . Hyperglycemia 10/23/2017  . Vertigo 09/29/2016  . Bronchiolitis obliterans organizing pneumonia ([place]) 09/29/2016  . Neck pain, musculoskeletal 12/15/2015  . Anxiety about health 09/29/2015  . Abnormal CAT scan 04/11/2015  . Auditory impairment 04/11/2015  . Insomnia, persistent 04/11/2015  . Chronic kidney disease (CKD), stage I 04/11/2015  . Chronic nonmalignant pain 04/11/2015  . Chronic constipation 04/11/2015  . Dyslipidemia 04/11/2015  . Fibromyalgia 04/11/2015  . Gastro-esophageal reflux disease without esophagitis 04/11/2015  . Dysphonia 04/11/2015  . Absence of bladder continence 04/11/2015  . Low back pain 04/11/2015  . Eczema intertrigo 04/11/2015  . Keratosis pilaris 04/11/2015  . Chronic recurrent major depressive disorder ([place]) 04/11/2015  . Dysmetabolic syndrome 63/78/5885  . Neurogenic claudication 04/11/2015  . Perennial allergic rhinitis with seasonal variation 04/11/2015  . Abnormal presence of protein in urine 04/11/2015  . Seborrhea capitis 04/11/2015  . Moderate dysplasia of vulva 04/11/2015  . Vitamin D deficiency 04/11/2015  . Treadmill stress test negative for angina pectoris 03/05/2015  . Shortness of breath 05/20/2014  . Morbid obesity ([place]) 02/03/2014  . Asthma, chronic 12/31/2013  . H/O total knee replacement 12/31/2013  . Episcleritis 09/26/2013  . Vocal cord dysfunction 05/28/2013  . Anxiety, generalized 03/19/2013  . Back pain 12/18/2012  . Pulmonary nodules 11/26/2012  . Lung nodule,  multiple 11/26/2012  . Arthritis, degenerative 11/01/2012  . H/O aspiration pneumonitis 10/22/2012  . Postinflammatory pulmonary fibrosis ([place]) 10/22/2012  . Mixed incontinence 05/04/2012  . Benign neoplasm of pituitary gland and craniopharyngeal duct ([place]) 12/29/2010  . Lung involvement  in systemic lupus erythematosus ([place]) 11/05/2010  . Chronic nausea 07/01/2008  . Benign hypertension 01/25/2005    Social History   Tobacco Use  . Smoking status: Never Smoker  . Smokeless tobacco: Never Used  Substance Use Topics  . Alcohol use: No    Alcohol/week: 0.0 oz     Current Outpatient Medications:  .  albuterol (PROAIR HFA) 108 (90 BASE) MCG/ACT inhaler, Inhale 2 puffs into the lungs every 6 (six) hours as needed for wheezing. DX code R06.02, Disp: 3 Inhaler, Rfl: 3 .  amLODipine (NORVASC) 2.5 MG tablet, Take 1 tablet (2.5 mg total) by mouth daily., Disp: 90 tablet, Rfl: 0 .  aspirin EC 81 MG tablet, Take 1 tablet (81 mg total) by mouth daily., Disp: 30 tablet, Rfl: 0 .  atorvastatin (LIPITOR) 40 MG tablet, Take 1 tablet (40 mg total) by mouth daily., Disp: 14 tablet, Rfl: 0 .  azelastine (OPTIVAR) 0.05 % ophthalmic solution, Place 1 drop into both eyes 2 (two) times daily., Disp: 6 mL, Rfl: 1 .  bisacodyl (DULCOLAX) 10 MG suppository, Place rectally., Disp: , Rfl:  .  buPROPion (WELLBUTRIN XL) 300 MG 24 hr tablet, Take 1 tablet by mouth daily., Disp: , Rfl:  .  clindamycin (CLEOCIN) 300 MG capsule, TK 2 CS PO WITHIN 1 HOUR OF ANY DENTAL PROCEDURE, Disp: , Rfl:  .  clonazePAM (KLONOPIN) 0.5 MG tablet, Take 0.5 tablets (0.25 mg total) by mouth daily., Disp: 15 tablet, Rfl: 0 .  cyclobenzaprine (FLEXERIL) 5 MG tablet, TAKE 1 TABLET BY MOUTH  EVERY 8 HOURS AS NEEDED FOR MUSCLE SPASMS. DO NOT DRIVE FOR 8 HOURS, Disp: 90 tablet, Rfl: 1 .  diclofenac sodium (VOLTAREN) 1 % GEL, APP 2 GRAMS EXT AA QID, Disp: , Rfl: 6 .  dicyclomine (BENTYL) 20 MG tablet, Take 1 tablet (20 mg total) by mouth every 6 (six) hours as needed (Abdominal Cramping/Pain)., Disp: 15 tablet, Rfl: 0 .  docusate sodium (COLACE) 100 MG capsule, Take by mouth., Disp: , Rfl:  .  DULoxetine (CYMBALTA) 60 MG capsule, Take 120 mg by mouth at bedtime. , Disp: , Rfl:  .  FLOVENT HFA 110 MCG/ACT inhaler, INHALE 2 PUFFS  PO BID, Disp: , Rfl: 0 .  fluticasone (FLONASE) 50 MCG/ACT nasal spray, USE 2 SPRAYS IN EACH  NOSTRIL DAILY, Disp: 48 g, Rfl: 1 .  gabapentin (NEURONTIN) 300 MG capsule, Take 1 capsule by mouth 3 (three) times daily., Disp: , Rfl:  .  gentamicin (GARAMYCIN) 0.3 % ophthalmic solution, Place 2 drops into both eyes 4 (four) times daily., Disp: 5 mL, Rfl: 0 .  hydroxychloroquine (PLAQUENIL) 200 MG tablet, Take 200 mg by mouth 2 (two) times daily., Disp: , Rfl:  .  lamoTRIgine (LAMICTAL) 200 MG tablet, Take 1 tablet (200 mg total) by mouth 2 (two) times daily., Disp: 60 tablet, Rfl: 0 .  meclizine (ANTIVERT) 25 MG tablet, TAKE 1 TABLET BY MOUTH 3  TIMES DAILY AS NEEDED, Disp: 30 tablet, Rfl: 0 .  metFORMIN (GLUCOPHAGE-XR) 500 MG 24 hr tablet, Take 1 tablet (500 mg total) by mouth daily with breakfast., Disp: 90 tablet, Rfl: 1 .  mycophenolate (CELLCEPT) 500 MG tablet, Take 2 tablets (1,000 mg total) by mouth 2 (two) times daily.,  Disp: 5 tablet, Rfl: 0 .  olopatadine (PATANOL) 0.1 % ophthalmic solution, Place 1 drop into both eyes 2 (two) times daily., Disp: , Rfl:  .  omeprazole (PRILOSEC) 40 MG capsule, TAKE 1 CAPSULE BY MOUTH  DAILY, Disp: 90 capsule, Rfl: 1 .  ondansetron (ZOFRAN) 4 MG tablet, TAKE 1 TABLET BY MOUTH  EVERY 8 HOURS AS NEEDED FOR NAUSEA AND VOMITING, Disp: 60 tablet, Rfl: 0 .  polyethylene glycol powder (GLYCOLAX/MIRALAX) powder, 2 cap fulls in a full glass of water, three times a day, for 5 days., Disp: 850 g, Rfl: 0 .  prednisoLONE acetate (PRED FORTE) 1 % ophthalmic suspension, Place 1 drop into both eyes 2 (two) times daily as needed., Disp: , Rfl: 0 .  predniSONE (DELTASONE) 2.5 MG tablet, Take 7.5 mg by mouth daily., Disp: , Rfl: 0 .  promethazine (PHENERGAN) 25 MG tablet, , Disp: , Rfl:  .  ranitidine (ZANTAC) 150 MG tablet, Take 1 tablet by mouth 2 (two) times daily., Disp: , Rfl:  .  topiramate (TOPAMAX) 50 MG tablet, Take 1 tablet by mouth 3 (three) times daily. Dr. [place]  James - Pain management, Disp: , Rfl:  .  traZODone (DESYREL) 100 MG tablet, Take 1 tablet by mouth at bedtime., Disp: , Rfl:  .  triamcinolone ointment (KENALOG) 0.1 %, , Disp: , Rfl:  .  YUVAFEM 10 MCG TABS vaginal tablet, Place 1 tablet vaginally once a week., Disp: , Rfl:  .  Cholecalciferol (VITAMIN D) 2000 UNITS CAPS, Take 1 capsule by mouth daily., Disp: , Rfl:  .  Multiple Vitamin (MULTIVITAMIN) tablet, Take 1 tablet by mouth daily., Disp: , Rfl:  .  telmisartan-hydrochlorothiazide (MICARDIS HCT) 80-25 MG tablet, Take 1 tablet by mouth daily., Disp: 90 tablet, Rfl: 0  Allergies  Allergen Reactions  . Ace Inhibitors     Other reaction(s): OTHER Pt states she can not take ace inhibitors. Pt states she can not remember her reaction.   Marland Kitchen Penicillins     ROS Constitutional: Negative for fever or weight change.  Respiratory: Negative for cough and shortness of breath.   Cardiovascular: Negative edema or palpitations.  Gastrointestinal: Negative for abdominal pain, no bowel changes.  Musculoskeletal: Negative for gait problem or joint swelling.  Skin: Negative for rash.  Neurological: Negative for dizziness; positive headache.  No other specific complaints in a complete review of systems (except as listed in HPI above).  Objective  Vitals:   11/21/17 0934  BP: 118/72  Pulse: 91  Resp: 16  Temp: 98.3 F (36.8 C)  TempSrc: Oral  SpO2: 93%  Weight: 250 lb 4.8 oz (113.5 kg)     Body mass index is 38.06 kg/m.  Nursing Note and Vital Signs reviewed.  Physical Exam  Constitutional: Patient appears well-developed and well-nourished. Obese.  No distress.  Cardiovascular: Normal rate, regular rhythm, S1/S2 present.  No murmur or rub heard. No BLE edema. Pulmonary/Chest: Effort normal and breath sounds clear. No respiratory distress or retractions. Abdominal: Soft and non-tender, bowel sounds present x4 quadrants.  No CVA Tenderness  MSK: No swelling, redness noted to  hands-full ROM, good bilateral grip strength  Psychiatric: Patient has a normal mood and affect. behavior is normal. Judgment and thought content normal.  No results found for this or any previous visit (from the past 72 hour(s)).  Assessment & Plan 1. Benign hypertension - stop valsartan ans start new medication; 2 week supply at [facility] and the rest will be mail ordered.  -  telmisartan-hydrochlorothiazide (MICARDIS HCT) 80-25 MG tablet; Take 1 tablet by mouth daily.  Dispense: 16 tablet; Refill: 0  2. Anxiety, generalized - try to avoid triggers, pause and take deep breaths when you are getting anxious, continue medication as necessary  3. Morbid obesity ([place]) - praised for positive steps towards eating right and getting gym membership. Keep it up! - Discussed calorie counting   4. Insomnia, persistent - spend some time being active during the day and then try to do relaxing activities for two hours before bed.   5. Chronic intractable headache, unspecified headache type - start taking topamax - continue with tylenol prn   6. Pain in both hands - Follow up with ortho and rheum   7. Hematuria, unspecified type - Positive leuks. Will culture and treat as needed.  - POCT urinalysis dipstick - Urine Culture   -Red flags and when to present for emergency care or RTC including fever >101.58F, chest pain, shortness of breath, new/worsening/un-resolving symptoms, severe lower back pain reviewed with patient at time of visit. Follow up and care instructions discussed and provided in AVS.   I have reviewed this encounter including the documentation in this note and/or discussed this patient with the provider,Ahmaya Ostermiller  FNP, NP-C. I am certifying that I agree with the content of this note as supervising physician.  Steele Sizer, MD [facility] 11/21/2017, 4:46 PM

## 2017-11-21 NOTE — Patient Instructions (Addendum)
Great Job with changing diet and joining Gym! Keep follow-up with Ortho and Rheum for hand pain. Start taking your topamax and your new bp medicine is sent to [facility]- stop the valsartan. We will culture your urine to make sure you do not have a urinary tract infection.   Calorie Counting for Weight Loss Calories are units of energy. Your body needs a certain amount of calories from food to keep you going throughout the day. When you eat more calories than your body needs, your body stores the extra calories as fat. When you eat fewer calories than your body needs, your body burns fat to get the energy it needs. Calorie counting means keeping track of how many calories you eat and drink each day. Calorie counting can be helpful if you need to lose weight. If you make sure to eat fewer calories than your body needs, you should lose weight. Ask your health care provider what a healthy weight is for you. For calorie counting to work, you will need to eat the right number of calories in a day in order to lose a healthy amount of weight per week. A dietitian can help you determine how many calories you need in a day and will give you suggestions on how to reach your calorie goal.  A healthy amount of weight to lose per week is usually 1-2 lb (0.5-0.9 kg). This usually means that your daily calorie intake should be reduced by 500-750 calories.  Eating 1,200 - 1,500 calories per day can help most women lose weight.  Eating 1,500 - 1,800 calories per day can help most men lose weight.  What is my plan? My goal is to have __________ calories per day. If I have this many calories per day, I should lose around __________ pounds per week. What do I need to know about calorie counting? In order to meet your daily calorie goal, you will need to:  Find out how many calories are in each food you would like to eat. Try to do this before you eat.  Decide how much of the food you plan to eat.  Write down what  you ate and how many calories it had. Doing this is called keeping a food log.  To successfully lose weight, it is important to balance calorie counting with a healthy lifestyle that includes regular activity. Aim for 150 minutes of moderate exercise (such as walking) or 75 minutes of vigorous exercise (such as running) each week. Where do I find calorie information?  The number of calories in a food can be found on a Nutrition Facts label. If a food does not have a Nutrition Facts label, try to look up the calories online or ask your dietitian for help. Remember that calories are listed per serving. If you choose to have more than one serving of a food, you will have to multiply the calories per serving by the amount of servings you plan to eat. For example, the label on a package of bread might say that a serving size is 1 slice and that there are 90 calories in a serving. If you eat 1 slice, you will have eaten 90 calories. If you eat 2 slices, you will have eaten 180 calories. How do I keep a food log? Immediately after each meal, record the following information in your food log:  What you ate. Don't forget to include toppings, sauces, and other extras on the food.  How much you ate. This can  be measured in cups, ounces, or number of items.  How many calories each food and drink had.  The total number of calories in the meal.  Keep your food log near you, such as in a small notebook in your pocket, or use a mobile app or website. Some programs will calculate calories for you and show you how many calories you have left for the day to meet your goal. What are some calorie counting tips?  Use your calories on foods and drinks that will fill you up and not leave you hungry: ? Some examples of foods that fill you up are nuts and nut butters, vegetables, lean proteins, and high-fiber foods like whole grains. High-fiber foods are foods with more than 5 g fiber per serving. ? Drinks such as  sodas, specialty coffee drinks, alcohol, and juices have a lot of calories, yet do not fill you up.  Eat nutritious foods and avoid empty calories. Empty calories are calories you get from foods or beverages that do not have many vitamins or protein, such as candy, sweets, and soda. It is better to have a nutritious high-calorie food (such as an avocado) than a food with few nutrients (such as a bag of chips).  Know how many calories are in the foods you eat most often. This will help you calculate calorie counts faster.  Pay attention to calories in drinks. Low-calorie drinks include water and unsweetened drinks.  Pay attention to nutrition labels for "low fat" or "fat free" foods. These foods sometimes have the same amount of calories or more calories than the full fat versions. They also often have added sugar, starch, or salt, to make up for flavor that was removed with the fat.  Find a way of tracking calories that works for you. Get creative. Try different apps or programs if writing down calories does not work for you. What are some portion control tips?  Know how many calories are in a serving. This will help you know how many servings of a certain food you can have.  Use a measuring cup to measure serving sizes. You could also try weighing out portions on a kitchen scale. With time, you will be able to estimate serving sizes for some foods.  Take some time to put servings of different foods on your favorite plates, bowls, and cups so you know what a serving looks like.  Try not to eat straight from a bag or box. Doing this can lead to overeating. Put the amount you would like to eat in a cup or on a plate to make sure you are eating the right portion.  Use smaller plates, glasses, and bowls to prevent overeating.  Try not to multitask (for example, watch TV or use your computer) while eating. If it is time to eat, sit down at a table and enjoy your food. This will help you to know  when you are full. It will also help you to be aware of what you are eating and how much you are eating. What are tips for following this plan? Reading food labels  Check the calorie count compared to the serving size. The serving size may be smaller than what you are used to eating.  Check the source of the calories. Make sure the food you are eating is high in vitamins and protein and low in saturated and trans fats. Shopping  Read nutrition labels while you shop. This will help you make healthy decisions before you  decide to purchase your food.  Make a grocery list and stick to it. Cooking  Try to cook your favorite foods in a healthier way. For example, try baking instead of frying.  Use low-fat dairy products. Meal planning  Use more fruits and vegetables. Half of your plate should be fruits and vegetables.  Include lean proteins like poultry and fish. How do I count calories when eating out?  Ask for smaller portion sizes.  Consider sharing an entree and sides instead of getting your own entree.  If you get your own entree, eat only half. Ask for a box at the beginning of your meal and put the rest of your entree in it so you are not tempted to eat it.  If calories are listed on the menu, choose the lower calorie options.  Choose dishes that include vegetables, fruits, whole grains, low-fat dairy products, and lean protein.  Choose items that are boiled, broiled, grilled, or steamed. Stay away from items that are buttered, battered, fried, or served with cream sauce. Items labeled "crispy" are usually fried, unless stated otherwise.  Choose water, low-fat milk, unsweetened iced tea, or other drinks without added sugar. If you want an alcoholic beverage, choose a lower calorie option such as a glass of wine or light beer.  Ask for dressings, sauces, and syrups on the side. These are usually high in calories, so you should limit the amount you eat.  If you want a salad,  choose a garden salad and ask for grilled meats. Avoid extra toppings like bacon, cheese, or fried items. Ask for the dressing on the side, or ask for olive oil and vinegar or lemon to use as dressing.  Estimate how many servings of a food you are given. For example, a serving of cooked rice is  cup or about the size of half a baseball. Knowing serving sizes will help you be aware of how much food you are eating at restaurants. The list below tells you how big or small some common portion sizes are based on everyday objects: ? 1 oz-4 stacked dice. ? 3 oz-1 deck of cards. ? 1 tsp-1 die. ? 1 Tbsp- a ping-pong ball. ? 2 Tbsp-1 ping-pong ball. ?  cup- baseball. ? 1 cup-1 baseball. Summary  Calorie counting means keeping track of how many calories you eat and drink each day. If you eat fewer calories than your body needs, you should lose weight.  A healthy amount of weight to lose per week is usually 1-2 lb (0.5-0.9 kg). This usually means reducing your daily calorie intake by 500-750 calories.  The number of calories in a food can be found on a Nutrition Facts label. If a food does not have a Nutrition Facts label, try to look up the calories online or ask your dietitian for help.  Use your calories on foods and drinks that will fill you up, and not on foods and drinks that will leave you hungry.  Use smaller plates, glasses, and bowls to prevent overeating. This information is not intended to replace advice given to you by your health care provider. Make sure you discuss any questions you have with your health care provider. Document Released: 08/22/2005 Document Revised: 07/22/2016 Document Reviewed: 07/22/2016 Elsevier Interactive Patient Education  [employer].

## 2017-11-22 LAB — URINE CULTURE
MICRO NUMBER:: 90344848
SPECIMEN QUALITY:: ADEQUATE

## 2017-12-07 ENCOUNTER — Other Ambulatory Visit: Payer: Self-pay | Admitting: Family Medicine

## 2017-12-07 DIAGNOSIS — I10 Essential (primary) hypertension: Secondary | ICD-10-CM

## 2017-12-12 ENCOUNTER — Encounter: Admit: 2017-12-12 | Discharge: 2017-12-13 | Payer: MEDICARE | Attending: Psychiatry | Primary: Psychiatry

## 2017-12-12 DIAGNOSIS — R413 Other amnesia: Secondary | ICD-10-CM

## 2017-12-12 DIAGNOSIS — F329 Major depressive disorder, single episode, unspecified: Principal | ICD-10-CM

## 2017-12-12 DIAGNOSIS — F411 Generalized anxiety disorder: Secondary | ICD-10-CM

## 2017-12-12 DIAGNOSIS — G4700 Insomnia, unspecified: Secondary | ICD-10-CM

## 2017-12-12 MED ORDER — BUPROPION HCL XL 300 MG 24 HR TABLET, EXTENDED RELEASE
ORAL_TABLET | Freq: Every morning | ORAL | 1 refills | 0.00000 days | Status: CP
Start: 2017-12-12 — End: 2018-01-05

## 2017-12-12 MED ORDER — BUPROPION HCL XL 150 MG 24 HR TABLET, EXTENDED RELEASE
ORAL_TABLET | Freq: Every day | ORAL | 1 refills | 0.00000 days | Status: CP
Start: 2017-12-12 — End: 2018-01-05

## 2017-12-12 MED ORDER — LAMOTRIGINE 200 MG TABLET
ORAL_TABLET | Freq: Two times a day (BID) | ORAL | 1 refills | 0 days | Status: CP
Start: 2017-12-12 — End: 2018-02-27

## 2017-12-12 MED ORDER — DULOXETINE 60 MG CAPSULE,DELAYED RELEASE
ORAL_CAPSULE | Freq: Every day | ORAL | 1 refills | 0.00000 days | Status: CP
Start: 2017-12-12 — End: 2018-02-27

## 2017-12-12 MED ORDER — TRAZODONE 100 MG TABLET
ORAL_TABLET | Freq: Every evening | ORAL | 1 refills | 0.00000 days | Status: CP
Start: 2017-12-12 — End: 2018-02-27

## 2017-12-12 MED ORDER — CLONAZEPAM 0.5 MG TABLET
ORAL_TABLET | Freq: Every evening | ORAL | 2 refills | 0.00000 days | Status: CP
Start: 2017-12-12 — End: 2018-02-27

## 2017-12-14 ENCOUNTER — Encounter: Admit: 2017-12-14 | Discharge: 2017-12-15 | Payer: MEDICARE

## 2017-12-14 DIAGNOSIS — Z8719 Personal history of other diseases of the digestive system: Secondary | ICD-10-CM | POA: Diagnosis not present

## 2017-12-14 DIAGNOSIS — Z1231 Encounter for screening mammogram for malignant neoplasm of breast: Secondary | ICD-10-CM | POA: Diagnosis not present

## 2017-12-14 DIAGNOSIS — Z7952 Long term (current) use of systemic steroids: Secondary | ICD-10-CM | POA: Diagnosis not present

## 2017-12-14 DIAGNOSIS — K219 Gastro-esophageal reflux disease without esophagitis: Secondary | ICD-10-CM | POA: Diagnosis not present

## 2017-12-14 DIAGNOSIS — Z803 Family history of malignant neoplasm of breast: Secondary | ICD-10-CM | POA: Diagnosis not present

## 2017-12-14 DIAGNOSIS — Z8709 Personal history of other diseases of the respiratory system: Secondary | ICD-10-CM | POA: Diagnosis not present

## 2017-12-14 DIAGNOSIS — Z8669 Personal history of other diseases of the nervous system and sense organs: Secondary | ICD-10-CM | POA: Diagnosis not present

## 2017-12-14 DIAGNOSIS — Z9289 Personal history of other medical treatment: Secondary | ICD-10-CM | POA: Diagnosis not present

## 2017-12-14 DIAGNOSIS — I129 Hypertensive chronic kidney disease with stage 1 through stage 4 chronic kidney disease, or unspecified chronic kidney disease: Secondary | ICD-10-CM | POA: Diagnosis not present

## 2017-12-14 DIAGNOSIS — M1990 Unspecified osteoarthritis, unspecified site: Secondary | ICD-10-CM | POA: Diagnosis not present

## 2017-12-14 DIAGNOSIS — Z833 Family history of diabetes mellitus: Secondary | ICD-10-CM | POA: Diagnosis not present

## 2017-12-14 DIAGNOSIS — N189 Chronic kidney disease, unspecified: Secondary | ICD-10-CM | POA: Diagnosis not present

## 2017-12-14 DIAGNOSIS — Z800 Family history of malignant neoplasm of digestive organs: Secondary | ICD-10-CM | POA: Diagnosis not present

## 2017-12-14 DIAGNOSIS — H905 Unspecified sensorineural hearing loss: Secondary | ICD-10-CM | POA: Diagnosis not present

## 2017-12-14 DIAGNOSIS — Z8639 Personal history of other endocrine, nutritional and metabolic disease: Secondary | ICD-10-CM | POA: Diagnosis not present

## 2017-12-14 DIAGNOSIS — Z823 Family history of stroke: Secondary | ICD-10-CM | POA: Diagnosis not present

## 2017-12-14 DIAGNOSIS — Z8249 Family history of ischemic heart disease and other diseases of the circulatory system: Secondary | ICD-10-CM | POA: Diagnosis not present

## 2017-12-14 DIAGNOSIS — Z780 Asymptomatic menopausal state: Secondary | ICD-10-CM | POA: Diagnosis not present

## 2017-12-14 DIAGNOSIS — Z8739 Personal history of other diseases of the musculoskeletal system and connective tissue: Secondary | ICD-10-CM | POA: Diagnosis not present

## 2017-12-14 DIAGNOSIS — Z1239 Encounter for other screening for malignant neoplasm of breast: Principal | ICD-10-CM

## 2017-12-14 LAB — HM MAMMOGRAPHY: HM MAMMO: NORMAL (ref 0–4)

## 2017-12-14 LAB — HM DEXA SCAN: HM DEXA SCAN: NORMAL

## 2017-12-21 ENCOUNTER — Encounter: Admit: 2017-12-21 | Discharge: 2017-12-22 | Payer: MEDICARE

## 2017-12-21 DIAGNOSIS — Z79899 Other long term (current) drug therapy: Secondary | ICD-10-CM | POA: Diagnosis not present

## 2017-12-21 DIAGNOSIS — H25013 Cortical age-related cataract, bilateral: Secondary | ICD-10-CM | POA: Diagnosis not present

## 2017-12-21 DIAGNOSIS — H04123 Dry eye syndrome of bilateral lacrimal glands: Secondary | ICD-10-CM | POA: Diagnosis not present

## 2017-12-21 DIAGNOSIS — M329 Systemic lupus erythematosus, unspecified: Secondary | ICD-10-CM | POA: Diagnosis not present

## 2017-12-21 DIAGNOSIS — H43393 Other vitreous opacities, bilateral: Secondary | ICD-10-CM | POA: Diagnosis not present

## 2017-12-21 DIAGNOSIS — H269 Unspecified cataract: Secondary | ICD-10-CM

## 2017-12-21 DIAGNOSIS — H04129 Dry eye syndrome of unspecified lacrimal gland: Secondary | ICD-10-CM

## 2017-12-21 LAB — HM DIABETES EYE EXAM

## 2017-12-28 MED ORDER — MYCOPHENOLATE MOFETIL 500 MG TABLET
ORAL_TABLET | Freq: Two times a day (BID) | ORAL | 3 refills | 0 days | Status: CP
Start: 2017-12-28 — End: 2017-12-29

## 2017-12-29 MED ORDER — MYCOPHENOLATE MOFETIL 500 MG TABLET
ORAL_TABLET | ORAL | 0 refills | 0 days | Status: CP
Start: 2017-12-29 — End: 2018-01-31

## 2018-01-04 ENCOUNTER — Other Ambulatory Visit: Payer: Self-pay | Admitting: Family Medicine

## 2018-01-04 DIAGNOSIS — I10 Essential (primary) hypertension: Secondary | ICD-10-CM

## 2018-01-04 NOTE — Telephone Encounter (Signed)
Refill request for Hypertension medication:  Telmisartan-HCTZ 80-25  Last office visit pertaining to hypertension: 11/21/2017  BP Readings from Last 3 Encounters:  11/21/17 118/72  11/16/17 136/84  11/10/17 (!) 162/94     Lab Results  Component Value Date   CREATININE 0.98 11/09/2017   BUN 14 11/09/2017   NA 136 11/09/2017   K 4.0 11/09/2017   CL 99 (L) 11/09/2017   CO2 28 11/09/2017   Follow-ups on file. 01/31/2018

## 2018-01-05 MED ORDER — BUPROPION HCL XL 300 MG 24 HR TABLET, EXTENDED RELEASE
ORAL_TABLET | Freq: Every morning | ORAL | 5 refills | 0.00000 days | Status: CP
Start: 2018-01-05 — End: 2018-02-27

## 2018-01-05 MED ORDER — BUPROPION HCL XL 150 MG 24 HR TABLET, EXTENDED RELEASE
ORAL_TABLET | Freq: Every day | ORAL | 5 refills | 0 days | Status: CP
Start: 2018-01-05 — End: 2018-02-27

## 2018-01-11 ENCOUNTER — Telehealth: Payer: Self-pay | Admitting: Family Medicine

## 2018-01-11 NOTE — Telephone Encounter (Signed)
Patient notified

## 2018-01-11 NOTE — Telephone Encounter (Signed)
It can be from lupus or medications, I would discuss with her rheumatologist and maybe dermatologist

## 2018-01-11 NOTE — Telephone Encounter (Signed)
Copied from [place] [phone]. Topic: Quick Communication - See Telephone Encounter >> Jan 11, 2018  1:39 PM Synthia Innocent wrote: CRM for notification. See Telephone encounter for: 01/11/18. Over the last month her hair is falling out in spot. Any suggestions?

## 2018-01-17 ENCOUNTER — Other Ambulatory Visit: Payer: Self-pay | Admitting: Family Medicine

## 2018-01-17 DIAGNOSIS — R110 Nausea: Secondary | ICD-10-CM

## 2018-01-17 NOTE — Telephone Encounter (Signed)
Refill request for general medication. Zofran   Last office visit 11/16/2017   Follow up on 02/21/2018

## 2018-01-19 ENCOUNTER — Other Ambulatory Visit: Payer: Self-pay | Admitting: Family Medicine

## 2018-01-31 ENCOUNTER — Ambulatory Visit: Payer: Medicare Other | Admitting: Family Medicine

## 2018-01-31 ENCOUNTER — Encounter: Admit: 2018-01-31 | Discharge: 2018-01-31 | Payer: MEDICARE

## 2018-01-31 DIAGNOSIS — R200 Anesthesia of skin: Secondary | ICD-10-CM | POA: Diagnosis not present

## 2018-01-31 DIAGNOSIS — M329 Systemic lupus erythematosus, unspecified: Secondary | ICD-10-CM | POA: Diagnosis not present

## 2018-01-31 DIAGNOSIS — J8489 Other specified interstitial pulmonary diseases: Secondary | ICD-10-CM | POA: Diagnosis not present

## 2018-01-31 DIAGNOSIS — M3219 Other organ or system involvement in systemic lupus erythematosus: Secondary | ICD-10-CM | POA: Diagnosis not present

## 2018-01-31 DIAGNOSIS — M797 Fibromyalgia: Secondary | ICD-10-CM | POA: Diagnosis not present

## 2018-01-31 DIAGNOSIS — Z79899 Other long term (current) drug therapy: Secondary | ICD-10-CM | POA: Diagnosis not present

## 2018-01-31 DIAGNOSIS — M79641 Pain in right hand: Secondary | ICD-10-CM | POA: Diagnosis not present

## 2018-01-31 DIAGNOSIS — M79642 Pain in left hand: Secondary | ICD-10-CM | POA: Diagnosis not present

## 2018-01-31 MED ORDER — MYCOPHENOLATE SODIUM 360 MG TABLET,DELAYED RELEASE: 360 mg | tablet | Freq: Two times a day (BID) | 3 refills | 0 days | Status: AC

## 2018-01-31 MED ORDER — HYDROXYCHLOROQUINE 200 MG TABLET
ORAL_TABLET | Freq: Two times a day (BID) | ORAL | 2 refills | 0.00000 days | Status: CP
Start: 2018-01-31 — End: 2018-04-25

## 2018-01-31 MED ORDER — MYCOPHENOLATE SODIUM 360 MG TABLET,DELAYED RELEASE
ORAL_TABLET | Freq: Two times a day (BID) | ORAL | 3 refills | 0.00000 days | Status: CP
Start: 2018-01-31 — End: 2018-01-31

## 2018-01-31 NOTE — Unmapped (Signed)
Patient Name: Caitlin Smith [facility]  ZOX:[id number] Edwyna Ready, MD  Source of History: Patient and records  Date of Visit: 01/31/18    Chief Compliant: SLE, OA, fibromyalgia follow-up    Prior Rheumatologic History: Initially diagnosed in 2006 with SLE presenting with episcleritis and arthralgia with serology showing +ANA, +dsDNA.  She also has stage I CKD felt secondary not due to SLE but due to HTN and obesity. She had been previously followed by Dr. Hortencia Conradi who retired. Patient was evaluated by myself for the first time in September 2015.  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 She has had difficulty tapering off prednisone for her pulmonary process and most recently started on mycophenolate mofetil. She is being evaluated in Psychiatry for cognition issues and depression. Patient has been having confusion with her medications and was seen most recently by our clinical pharmacist to clarify meds.     HPI: Caitlin Smith is a 62 y.o. female who is here today for follow-up for SLE, OA, and fibromyalgia. 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 She was last evaluated by myself in October 2018 and by Carlus Pavlov [facility] in January 2019. She appeared well controlled at her last visit in regards to her SLE and no changes were made to her medication regimen. She returns today for follow-up.    Today, patient reports that she has been compliant with her medications. She reports that she is taking hydroxychloroquine 200 mg po bid, prednisone 7.5 mg daily and mycophenolate mofetil 500 mg po bid.  She tried to increase her mycophenolate mofetil to 1000 mg po bid per pulmonary, but experienced nausea and resumed 500 mg po bid. She reports of numbness in both hands, worse in left. Wrist splint present on L hand. She was seen in orthopedics this morning for her hand pain and an EMG/NCS was ordered. In regards to her breathing, she reports it has been stable. She does report having to use her inhaler more with the warmer weather. She has also been seen by Ophthalmology on April 18, and her eye exam was normal.     Patient denies rashes. Endorses slight itching on arms, dry eye, and frequent dry mouth. She also states she has some hair loss. She was diagnosed with diabetes about 1.5 months ago, and has started metformin for management.     ROS??: Attests to the above, otherwise, review of all other systems is negative.  ??  Past Medical and Surgical History:  ??  Patient Active Problem List    Diagnosis Date Noted   ??? Bronchiolitis obliterans organizing pneumonia (CMS-HCC) 09/29/2016   ??? Vertigo 09/29/2016   ??? Neck pain 12/15/2015   ??? Neck pain, musculoskeletal 12/15/2015   ??? Fibromyalgia 10/12/2015   ??? Anxiety about health 09/29/2015   ??? Abnormal computed tomography scan 04/11/2015   ??? Abnormal presence of protein in urine 04/11/2015   ??? Urinary incontinence 04/11/2015   ??? Hearing difficulty 04/11/2015   ??? Chronic constipation 04/11/2015   ??? Chronic kidney disease (CKD), stage I 04/11/2015   ??? Chronic pain not due to malignancy 04/11/2015   ??? Chronic recurrent major depressive disorder (CMS-HCC) 04/11/2015   ??? Dyslipidemia 04/11/2015   ??? Obesity, diabetes, and hypertension syndrome (CMS-HCC) 04/11/2015   ??? Intertrigo 04/11/2015   ??? Fibrositis 04/11/2015   ??? Gastroesophageal reflux disease without esophagitis  04/11/2015   ??? Vitamin D deficiency 04/11/2015   ??? Seborrhea capitis 04/11/2015   ??? Perennial allergic rhinitis with seasonal variation 04/11/2015   ??? Moderate dysplasia of vulva 04/11/2015   ??? Spinal stenosis of lumbar region 04/11/2015   ??? Low back pain 04/11/2015   ??? Keratosis pilaris 04/11/2015   ??? Persistent insomnia 04/11/2015   ??? Insomnia, persistent 04/11/2015   ??? Abnormal CAT scan 04/11/2015   ??? Absence of bladder continence 04/11/2015   ??? Auditory impairment 04/11/2015   ??? Chronic nonmalignant pain 04/11/2015   ??? Dysmetabolic syndrome 04/11/2015   ??? Eczema intertrigo 04/11/2015   ??? Gastro-esophageal reflux disease without esophagitis 04/11/2015   ??? Neurogenic claudication 04/11/2015   ??? Screening for cardiovascular condition 03/05/2015   ??? Encounter for screening for cardiovascular disorders 03/05/2015   ??? Treadmill stress test negative for angina pectoris 03/05/2015   ??? Nausea 10/30/2014   ??? Cough 05/20/2014   ??? Shortness of breath 05/20/2014   ??? Regurgitation 04/24/2014   ??? Epigastric burning sensation 04/24/2014   ??? Obesity (BMI 30-39.9) 02/03/2014   ??? Obesity with body mass index 30 or greater 02/03/2014   ??? Morbid obesity (CMS-HCC) 02/03/2014   ??? S/P total knee replacement 12/31/2013   ??? Asthma 12/31/2013   ??? History of total knee replacement 12/31/2013   ??? Asthma, chronic 12/31/2013   ??? H/O total knee replacement 12/31/2013   ??? SLE (systemic lupus erythematosus) (CMS-HCC) 11/28/2013   ??? Episcleritis 09/26/2013   ??? Constipation 07/19/2013   ??? Pre-op evaluation 07/10/2013   ??? Other diseases of vocal cords 05/28/2013   ??? Vocal cord dysfunction 05/28/2013   ??? GAD (generalized anxiety disorder) 03/19/2013   ??? Generalized anxiety disorder 03/19/2013   ??? Anxiety, generalized 03/19/2013   ??? Pain medication agreement signed 02/12/2013   ??? Family history of breast cancer    ??? 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 Backache 12/18/2012   ??? Back pain 12/18/2012   ??? Nonspecific abnormal finding of lung field 11/26/2012   ??? Multiple pulmonary nodules 11/26/2012   ??? Lung nodule, multiple 11/26/2012   ??? Pulmonary nodules 11/26/2012   ??? Osteoarthrosis 11/01/2012   ??? Osteoarthritis 11/01/2012   ??? Arthritis, degenerative 11/01/2012   ??? Postinflammatory pulmonary fibrosis (CMS-HCC) 10/22/2012   ??? Aspiration pneumonitis (CMS-HCC) 10/22/2012   ??? History of respiratory system disease 10/22/2012   ??? H/O aspiration pneumonitis 10/22/2012   ??? Mixed urge and stress incontinence 05/04/2012   ??? Mixed incontinence 05/04/2012   ??? Dysphonia 03/22/2012   ??? Sensorineural hearing loss 03/22/2012   ??? Chronic pain syndrome 05/19/2011   ??? Myalgia and myositis 02/25/2011   ??? Benign neoplasm of pituitary gland and craniopharyngeal duct (pouch) (CMS-HCC) 12/29/2010   ??? Benign neoplasm of pituitary gland and craniopharyngeal duct (CMS-HCC) 12/29/2010   ??? Chronic kidney disease, stage I 12/08/2010   ??? Proteinuria 12/08/2010   ??? Depressive disorder 11/05/2010   ??? Lupus erythematosus 11/05/2010   ??? Depression 11/05/2010   ??? Inflammatory autoimmune disorder (CMS-HCC) 11/05/2010   ??? Systemic lupus erythematosus (CMS-HCC) 11/05/2010   ??? Lung involvement in systemic lupus erythematosus (CMS-HCC) 11/05/2010   ??? Chronic nausea 07/01/2008   ??? Hypertension, benign 01/25/2005   ??? Benign hypertension 01/25/2005     Past Surgical History:   Procedure Laterality Date   ??? BRAIN SURGERY      for facial spasms   ??? BREAST BIOPSY Right 2012    Needle bx   ???  GALLBLADDER SURGERY     ??? HYSTERECTOMY N/A 07/09/2001    Vaginal Hysterectomy with ovaries in place   ??? PR ANAL PRESSURE RECORD N/A 03/22/2016    Procedure: ANORECTAL MANOMETRY;  Surgeon: Nurse-Based Giproc;  [facility]: [facility]URES [facility];  Service: Gastroenterology   ??? PR BRONCHOSCOPY,DIAGNOSTIC W LAVAGE Bilateral 04/25/2016    Procedure: BRONCHOSCOPY, RIGID OR FLEXIBLE, INCLUDE FLUOROSCOPIC GUIDANCE WHEN PERFORMED; W/BRONCHIAL ALVEOLAR LAVAGE WITH MODERATE SEDATION;  Surgeon: Mercy Moore, MD;  Location: BRONCH PROCEDURE LAB [facility];  Service: Pulmonary   ??? PR BRONCHOSCOPY,TRANSBRONCH BIOPSY N/A 04/25/2016    Procedure: BRONCHOSCOPY, RIGID/FLEXIBLE, INCLUDE FLUORO GUIDANCE WHEN PERFORMED; W/TRANSBRONCHIAL LUNG BX, SINGLE LOBE WITH MODERATE SEDATION;  Surgeon: Mercy Moore, MD;  Location: BRONCH PROCEDURE LAB [facility];  Service: Pulmonary   ??? PR COLON CA SCRN NOT HI RSK IND  08/22/2013    Procedure: COLOREC CNCR SCR;COLNSCPY NO; Surgeon: Brown Human, MD;  Location: GI PROCEDURES [facility];  Service: Gastroenterology   ??? PR GERD TST W/ MUCOS IMPEDE ELECTROD,>1HR N/A 05/26/2014    Procedure: ESOPHAGEAL FUNCTION TEST, GASTROESOPHAGEAL REFLUX TEST W/ NASAL CATHETER INTRALUMINAL IMPEDANCE ELECTRODE(S) PLACEMENT, RECORDING, ANALYSIS AND INTERPRETATION; PROLONGED;  Surgeon: Nurse-Based Giproc;  Location: [facility];  Service: Gastroenterology   ??? PR TOTAL KNEE ARTHROPLASTY Right 12/31/2013    Procedure: ARTHROPLASTY, KNEE, CONDYLE & PLATEAU; MEDIAL & LAT COMPARTMENT W/WO PATELLA RESURFACE (TOTAL KNEE ARTHROP);  Surgeon: Aram Beecham, MD;  Location: MAIN OR [facility];  Service: Orthopedics   ??? PR UPPER GI ENDOSCOPY,BIOPSY N/A 11/07/2014    Procedure: UGI ENDOSCOPY; WITH BIOPSY, SINGLE OR MULTIPLE;  Surgeon: Trula Slade, MD;  Location: [facility] PROCEDURES [facility];  Service: Gastroenterology   ??? wide local excision of vulva Right      Allergies:   ??  Allergies   Allergen Reactions   ??? Vilazodone Swelling   ??? Ace Inhibitors Other (See Comments)     Pt states she can not take ace inhibitors. Pt states she can not remember her reaction.    ??? Penicillin G Rash   ??? Penicillins Rash     Current Outpatient Medications:  ??  Current Outpatient Medications on File Prior to Visit   Medication Sig Dispense Refill   ??? albuterol (PROAIR HFA) 90 mcg/actuation inhaler Inhale 2 puffs every six (6) hours as needed for wheezing. 3 Inhaler 3   ??? amLODIPine (NORVASC) 2.5 MG tablet Take 2.5 mg by mouth daily.     ??? aspirin (ECOTRIN) 81 MG tablet Take 81 mg by mouth daily.     ??? atorvastatin (LIPITOR) 40 MG tablet Take 40 mg by mouth daily.     ??? buPROPion (WELLBUTRIN XL) 150 MG 24 hr tablet Take 1 tablet (150 mg total) by mouth daily. Take with 300 mg tablet for 450 mg total. 30 tablet 5   ??? buPROPion (WELLBUTRIN XL) 300 MG 24 hr tablet Take 1 tablet (300 mg total) by mouth every morning. Take with 150 mg tablet for 450 mg total. 30 tablet 5   ??? clindamycin (CLEOCIN) 300 MG capsule TAKE 2 CAPSULES (600MG ) BY MOUTH WITHIN 1 HOUR OF ANY DENTAL PROCEDURE 6 capsule 1   ??? clonazePAM (KLONOPIN) 0.5 MG tablet Take 0.5 tablets (0.25 mg total) by mouth nightly. 15 tablet 2   ??? cyclobenzaprine (FLEXERIL) 5 MG tablet Take 1 tablet (5 mg total) by mouth two (2) times a day as needed. 60 tablet 2   ??? diclofenac sodium (VOLTAREN) 1 % gel  Apply 4 g topically Four (4) times a day. 300 g 5   ??? dicyclomine (BENTYL) 20 mg tablet Take 20 mg by mouth every six (6) hours.     ??? DULoxetine (CYMBALTA) 60 MG capsule Take 2 capsules (120 mg total) by mouth daily. 180 capsule 1   ??? estradiol (VAGIFEM) 10 mcg vaginal tablet Insert 1 tablet (0.01 mg total) into the vagina Two (2) times a week. 24 tablet 8   ??? fluticasone (FLOVENT HFA) 110 mcg/actuation inhaler Inhale 2 puffs Two (2) times a day. 3 Inhaler 3   ??? gabapentin (NEURONTIN) 300 MG capsule Take 3 capsules (900 mg total) by mouth Three (3) times a day. 810 capsule 1   ??? hydrocortisone 1 % cream Apply 1 application topically daily at 0600.     ??? hydrocortisone 1 % cream Apply to affected area 2 times daily 30 g 0   ??? hydroxychloroquine (PLAQUENIL) 200 mg tablet Take 1 tablet (200 mg total) by mouth Two (2) times a day. 180 tablet 2   ??? lamoTRIgine (LAMICTAL) 200 MG tablet Take 1 tablet (200 mg total) by mouth Two (2) times a day. 180 tablet 1   ??? losartan-hydrochlorothiazide (HYZAAR) 50-12.5 mg per tablet Take 0.5 tablets by mouth daily.      ??? losartan-hydrochlorothiazide (HYZAAR) 50-12.5 mg per tablet TAKE ONE-HALF TABLET BY  MOUTH DAILY     ??? meclizine (ANTIVERT) 25 mg tablet Take 25 mg by mouth Three (3) times a day as needed.      ??? metFORMIN (GLUCOPHAGE) 500 MG tablet Take 500 mg by mouth 2 (two) times a day with meals.      ??? [EXPIRED] mycophenolate (CELLCEPT) 500 mg tablet Take 2 tablets (1,000 mg total) by mouth every morning AND 1 tablet (500 mg total) every evening. 90 tablet 0   ??? olopatadine (PATANOL) 0.1 % ophthalmic solution Administer 1 drop to both eyes Two (2) times a day. 5 mL 1   ??? omeprazole (PRILOSEC) 40 MG capsule Take 40 mg by mouth daily.      ??? polyethylene glycol (GLYCOLAX) 17 gram/dose powder 2 cap fulls in a full glass of water, three times a day, for 5 days.     ??? [EXPIRED] predniSONE (DELTASONE) 2.5 MG tablet Take 3 tablets (7.5 mg total) by mouth daily. 270 tablet 0   ??? ranitidine (ZANTAC) 150 MG tablet TAKE 1 TABLET BY MOUTH TWO  TIMES DAILY 180 tablet 1   ??? telmisartan-hydrochlorothiazide (MICARDIS HCT) 80-25 mg per tablet Take 1 tablet by mouth daily.     ??? topiramate (TOPAMAX) 50 MG tablet Take 1 tablet (50 mg total) by mouth Two (2) times a day. 180 tablet 1   ??? traZODone (DESYREL) 100 MG tablet Take 2 tablets (200 mg total) by mouth nightly. 180 tablet 1     No current facility-administered medications on file prior to visit.      ??  Immunization History   Administered Date(s) Administered   ??? Hepatitis A 08/12/2015, 02/10/2016   ??? Hepatitis B, Adult 06/26/2007, 08/12/2015, 10/13/2015, 02/10/2016   ??? INFLUENZA TIV (TRI) 83MO+ W/ PRESERV (IM) 06/01/2010, 05/14/2013, 04/19/2014   ??? INFLUENZA TIV (TRI) PF (IM) 06/26/2007, 06/02/2011, 12/01/2011, 06/09/2013   ??? Influenza Vaccine Quad (IIV4 PF) 35mo+ injectable 04/16/2015, 05/26/2016, 05/17/2017   ??? MMR 06/26/2007   ??? PNEUMOCOCCAL POLYSACCHARIDE 23 08/04/2016   ??? Pneumococcal Conjugate 13-Valent 08/06/2015, 05/26/2016   ??? TdaP 06/26/2007     ????  PHYSICAL EXAM??  Vital signs: BP 108/60  - Pulse 80  - Temp 35.7 ??C (96.2 ??F) (Oral)  - Wt (!) 105.3 kg (232 lb 1.6 oz)  - BMI 35.29 kg/m??   Gen: Well-developed, well-nourished adult in no apparent distress. Normocephalic with no external signs of trauma. Pleasant and cooperative. AOx4.?  HEENT: PERRLA, EOMI, MMM, oropharynx in pink without ulcerations or exudates visualized.??  Salivary pool present. Conjunctiva moist.   Lungs: Broad chest excursion with good air movement. CTAB without wheezing/rhonchi/rales. ????  CV: RRR, Normal S1/S2, No murmurs/rubs/gallops heard.  PV: Warm, 2+ radial and pedal pulses, no C/C/E.????  Neuro: Good comprehension/cognition. CN 2-12 intact.  Muscle strength 5/5 in all extremities. No sensory deficits. Gait normal.????  Comprehensive Musculoskeletal Examination:????  ?? Jaw, neck without limited ROM.????  ?? Shoulders, elbows, wrists, hands, fingers:  No deformity, erythema, warmth, swelling, effusion, limited ROM.?? Left wrist in splint.  ?? Lumbosacral spine and hips without limited ROM.??    ?? Knee, ankles, feet, toes: No deformity, erythema, warmth, swelling, effusion, limited ROM. Knee stable to valgus/varus stress and anterior/posterior drawer sign.??   ?? Multiple myofascial trigger points.  Skin: no rash    LABORATORY  Recent Results (from the past 3528 hour(s))   Anti-DNA antibody, double-stranded    Collection Time: 09/28/17 10:51 AM   Result Value Ref Range    dsDNA Ab Negative Negative   C3 complement    Collection Time: 09/28/17 10:51 AM   Result Value Ref Range    C3 Complement 113 88 - 171 mg/dL   C4 complement    Collection Time: 09/28/17 10:51 AM   Result Value Ref Range    C4 Complement 24.6 15.0 - 48.0 mg/dL   BUN    Collection Time: 09/28/17 10:51 AM   Result Value Ref Range    BUN 12 7 - 21 mg/dL   Creatinine    Collection Time: 09/28/17 10:51 AM   Result Value Ref Range    Creatinine 0.88 0.60 - 1.00 mg/dL    EGFR MDRD Af Amer 80 >=60 mL/min/1.71m2    EGFR MDRD Non Af Amer 66 >=60 mL/min/1.61m2   Albumin    Collection Time: 09/28/17 10:51 AM   Result Value Ref Range    Albumin 4.0 3.5 - 5.0 g/dL   AST    Collection Time: 09/28/17 10:51 AM   Result Value Ref Range    AST 24 14 - 38 U/L   ALT    Collection Time: 09/28/17 10:51 AM   Result Value Ref Range    ALT 20 15 - 48 U/L   CBC w/ Differential    Collection Time: 09/28/17 10:51 AM   Result Value Ref Range    WBC 6.9 4.5 - 11.0 10*9/L    RBC 5.08 4.00 - 5.20 10*12/L    HGB 13.3 12.0 - 16.0 g/dL    HCT 09.8 11.9 - 14.7 %    MCV 84.8 80.0 - 100.0 fL    MCH 26.2 26.0 - 34.0 pg    MCHC 30.9 (L) 31.0 - 37.0 g/dL    RDW 82.9 56.2 - 13.0 %    MPV 6.8 (L) 7.0 - 10.0 fL    Platelet 372 150 - 440 10*9/L    Absolute Neutrophils 4.3 2.0 - 7.5 10*9/L    Absolute Lymphocytes 1.9 1.5 - 5.0 10*9/L    Absolute Monocytes 0.3 0.2 - 0.8 10*9/L    Absolute Eosinophils 0.2 0.0 - 0.4 10*9/L    Absolute Basophils 0.1  0.0 - 0.1 10*9/L    Large Unstained Cells 2 0 - 4 %    Hypochromasia Slight (A) Not Present   Urinalysis with Culture Reflex    Collection Time: 09/28/17 11:00 AM   Result Value Ref Range    Color, UA Yellow     Clarity, UA Clear     Specific Gravity, UA 1.023 1.003 - 1.030    pH, UA 5.5 5.0 - 9.0    Leukocyte Esterase, UA Trace (A) Negative    Nitrite, UA Negative Negative    Protein, UA 30 mg/dL (A) Negative    Glucose, UA Negative Negative    Ketones, UA Negative Negative    Urobilinogen, UA 0.2 mg/dL 0.2 mg/dL, 1.0 mg/dL    Bilirubin, UA Negative Negative    Blood, UA Negative Negative    RBC, UA 2 <4 /HPF    WBC, UA 1 0 - 5 /HPF    Squam Epithel, UA 1 0 - 5 /HPF    Bacteria, UA None Seen None Seen /HPF    Mucus, UA Occasional (A) None Seen /HPF   Protein/Creatinine Ratio, Urine    Collection Time: 09/28/17 11:00 AM   Result Value Ref Range    Creat U 230.1 Undefined mg/dL    Protein, Ur 9.0 Undefined mg/dL    Protein/Creatinine Ratio, Urine 0.039 Undefined   Urine Culture    Collection Time: 09/28/17 11:00 AM   Result Value Ref Range    Urine Culture, Comprehensive Mixed Urogenital Flora    Spirometry Pre / Post Bronchodilator    Collection Time: 09/28/17 12:04 PM   Result Value Ref Range    FEV1 POST 1.25 (L) 1.71847 - 3.04787 L    FEV1/FVC POST 78.32 68.9479 - 90.3019 %    FVC POST 1.59 (L) 2.2446 - 3.79942 L    PEF POST 6.36 3.96576 - 8.35046 L/s    VOL extrap post 0.06 L    FIVC POST 0 (L) 2.2446 - 3.79942 L    FEV6 POST 1.56 (L) 2.16407 - 3.69519 L    FEV1/FEV6 POST 79.97 72.2038 - 91.8698 %    FEF50% POST 2.63 L/s FEF25-75% POST 1.16 0.82175 - 3.82273 L/s    [id number] POST 0.78 L/s    FET100% POST 8.31 sec    FEV1 PRE 1.33 (L) 1.71847 - 3.04787 L    FEV1/FVC PRE 75.56 68.9479 - 90.3019 %    FVC PRE 1.76 (L) 2.2446 - 3.79942 L    PEF PRE 6.93 3.96576 - 8.35046 L/s    Vol extrap pre 0.05 L    FIVC PRE 0 (L) 2.2446 - 3.79942 L    FEV6 PRE 1.72 (L) 2.16407 - 3.69519 L    FEV1/FEV6 PRE 77 72.2038 - 91.8698 %    FEF50% PRE 1.76 L/s    FEF25-75% PRE 1.06 0.82175 - 3.82273 L/s    [id number] PRE 1.06 L/s    FET100% Change 7.74 sec    DLCO PRE 18.45 36.64403474259 - 56.387564332951884 ml/(min*mmHg)    BHT POST 11.08 sec    DLCO/VA POST 6.23 (H) 1.66063016010 - 9.32355732202 ml/(min*mmHg*L)    VA PRE 2.96 (L) 4.99887 - 4.99887 L    IVC PRE 1.9 (L) 2.2446 - 3.79942 L    DL Adj PRE 54.27 ml/(min*mmHg)    DL/VA Adj PRE 0.62 ml/(min*mmHg*L)   Diffusion Studies    Collection Time: 09/28/17 12:04 PM   Result Value Ref Range    FEV1 POST 1.25 (L) 1.71847 - 3.04787 L  FEV1/FVC POST 78.32 68.9479 - 90.3019 %    FVC POST 1.59 (L) 2.2446 - 3.79942 L    PEF POST 6.36 3.96576 - 8.35046 L/s    VOL extrap post 0.06 L    FIVC POST 0 (L) 2.2446 - 3.79942 L    FEV6 POST 1.56 (L) 2.16407 - 3.69519 L    FEV1/FEV6 POST 79.97 72.2038 - 91.8698 %    FEF50% POST 2.63 L/s    FEF25-75% POST 1.16 0.82175 - 3.82273 L/s    [id number] POST 0.78 L/s    FET100% POST 8.31 sec    FEV1 PRE 1.33 (L) 1.71847 - 3.04787 L    FEV1/FVC PRE 75.56 68.9479 - 90.3019 %    FVC PRE 1.76 (L) 2.2446 - 3.79942 L    PEF PRE 6.93 3.96576 - 8.35046 L/s    Vol extrap pre 0.05 L    FIVC PRE 0 (L) 2.2446 - 3.79942 L    FEV6 PRE 1.72 (L) 2.16407 - 3.69519 L    FEV1/FEV6 PRE 77 72.2038 - 91.8698 %    FEF50% PRE 1.76 L/s    FEF25-75% PRE 1.06 0.82175 - 3.82273 L/s    [id number] PRE 1.06 L/s    FET100% Change 7.74 sec    DLCO PRE 18.45 29.56213086578 - 46.962952841324401 ml/(min*mmHg)    BHT POST 11.08 sec    DLCO/VA POST 6.23 (H) 0.27253664403 - 4.74259563875 ml/(min*mmHg*L)    VA PRE 2.96 (L) 4.99887 - 4.99887 L    IVC PRE 1.9 (L) 2.2446 - 3.79942 L    DL Adj PRE 64.33 ml/(min*mmHg)    DL/VA Adj PRE 2.95 ml/(min*mmHg*L)       ?GENERAL SUMMARY/IMPRESSION AND RECOMMENDATIONS: ??    ??In summary, the patient is a 62 y.o. female with h/o SLE (+ANA, +dsDNA, arthralgia, episcleritis), OA, fibromyalgia and CKD stage I secondary to HTN. She is currently on prednisone 7.5 mg po qd for eosinophilic pneumonitis vs BOOP/COP. Her mycophenolate mofetil was increased to 1000 mg po bid for her pulmonary process, but after starting this dose, she experienced nausea and decreased back to mycophenolate mofetil 500 mg po bid. Due to intolerance of mycophenolate mofetil at higher doses, will switch her over to Myfortic which has less GI side effects. She reports compliance with hydroxychloroquine 200 mg po bid with no signs of eye toxicity. Will continue this dose. No signs for inflammatory arthritis. Has myofascial tenderness consistent with h/o fibromyalgia which is stable. Labs today to monitor SLE disease activity. Check SPEP and light chain given history of hypergammaglobulinemia. Follow-up in four months with Carlus Pavlov [facility] and eight months with myself.   ????   Diagnosis ICD-10-CM Associated Orders   1. Other systemic lupus erythematosus with other organ involvement (CMS-HCC) M32.19 mycophenolate (MYFORTIC) 360 MG TbEC     CBC w/ Differential     Creatinine     AST     ALT     C3 complement     C4 complement     Anti-DNA antibody, double-stranded     Urinalysis with Culture Reflex     Protein/Creatinine Ratio, Urine     Serum Protein Electrophoresis and Immunofixation     CBC w/ Differential     Creatinine     AST     ALT     C3 complement     C4 complement     Anti-DNA antibody, double-stranded     Serum Protein Electrophoresis and Immunofixation     CBC w/ Differential  Urine Culture   2. Fibromyalgia M79.7    3. Bronchiolitis obliterans organizing pneumonia (CMS-HCC) J84.89 mycophenolate (MYFORTIC) 360 MG TbEC   4. Systemic lupus erythematosus, unspecified SLE type, unspecified organ involvement status (CMS-HCC) M32.9 hydroxychloroquine (PLAQUENIL) 200 mg tablet     mycophenolate (MYFORTIC) 360 MG TbEC     Patient Instructions   Can try biotin vitamin over the counter for hair and nails.    Plan to switch from Cellcept to Myfortic to address GI side effects.    The patient indicates understanding of these issues and agrees to the plan as outlined above.  Contact information provided for any concerns or questions in the interim.    This note was entered by [employer], acting as scribe for [employer]. Scarlette Calico, MD, PhD  Signature: CTV  Date:01/31/18   Time: 12:52 PM    I have reviewed the documentation provided by the scribe and confirm that it accurately reflects the service I personally performed and the decisions made by me.  Signature: Liliya Fullenwider C. Scarlette Calico, MD, PhD  Assistant Professor of Medicine  Department of Medicine/Division of Rheumatology  [facility]  Date: 01/31/18   Time: 12:52 PM      ??
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
## 2018-02-01 NOTE — Unmapped (Signed)
[facility] Specialty Medication Referral: No PA required    Medication (Brand/Generic): Mycophenolic acid    Initial FSI Test Claim completed with resulted information below:  No PA required  Patient ABLE to fill at [facility]  Insurance Company:  [employer]  Anticipated Copay: $3.40    As Co-pay is under $100 defined limit, per policy there will be no further investigation of need for financial assistance at this time unless patient requests. This referral has been communicated to the provider and handed off to the [facility] team for further processing and filling of prescribed medication.   ______________________________________________________________________  Please utilize this referral for viewing purposes as it will serve as the central location for all relevant documentation and updates.

## 2018-02-02 LAB — SERUM PROTEIN ELECTROPHORESIS AND IMMUNOFIXATION
ALBUMIN (SPE): 4 g/dL (ref 3.5–5.0)
ALPHA-1 GLOBULIN: 0.3 g/dL (ref 0.2–0.5)
ALPHA-2 GLOBULIN: 0.7 g/dL (ref 0.5–1.1)
BETA-1 GLOBULIN: 0.5 g/dL (ref 0.3–0.6)
BETA-2 GLOBULIN: 0.5 g/dL (ref 0.2–0.6)
PROTEIN TOTAL: 7.8 g/dL (ref 6.5–8.3)

## 2018-02-02 LAB — BETA-1 GLOBULIN: Lab: 0.5

## 2018-02-02 NOTE — Unmapped (Signed)
[facility] Pharmacy - Pharmacist Onboarding Note    Specialty Medication: Myfortic     MERIDEE BRANUM, DOB: Apr 18, 1956  Above HIPAA information was verified with patient.     KAJOL CRISPEN is a 62 y.o. female being initiated on Myfortic for systemic lupus erythematosus.  She is currently on Cellcept but experiencing GI side effect (nausea).  Medication list, allergies and comorbidities reviewed:  appropriate to initiate therapy.       Regimen & Administration: Myfortic 360 mg orally twice daily.    Administer on an empty stomach but may be taken with food if necessary.  If a dose is missed, administer as soon as it is remembered. If it is close to the next regularly scheduled time; do not double a dose to make up for for a missed dose    Storage/Handling/Disposal:  Room Temperature.  Patient was counseled on the handling of hazardous agent and will use precautions to minimize contact/exposure to medicine.     Drug-Drug & Drug-Food Interactions:  None noted    Side-effects:    ?? Discussed the risk of GI toxicity, headaches, dizziness, insomnia, tremor, abnormalities in blood counts, rash, infection, teratogenicity, and malignancy  ?? Counseled on signs of a significant drug reaction (wheezing, chest tightness, fever, itching, cough, blue skin color, seizures, swelling of face, lips, tongue or throat, etc)    Pharmacy Information:    ?? Patient will be receiving medication from the [facility] [phone], option 4).  A representative from the pharmacy will contact the patient to set up deliveries 7-10 days prior to their subsequent needed refill.  The pharmacy must speak to the patient to schedule the refill.  Advised patient to answer phone calls from the pharmacy to prevent delays in therapy.    ?? Patient will receive a medication information handout as well as a welcome packet from the pharmacy.    ?? The pharmacy is open Monday - Friday 8:30am-4:30pm.  A pharmacist is available 24/7 via pager to answer any clinical questions.    ?? Patient will receive medication through common mail carrier (UPS).  ?? Anticipated co-pay:  $3.40 for a 1 month supply.    ?? Medication assistance provided: n/a     Emphasized the importance of adherence to prescribed regimen, clinic follow-up visits, and laboratory testing.      SHIPPING     ?? Shipping address verified in [employer].  Expected medication delivery date: 02/06/18, via UPS.  Patient plans to start therapy on the same day.    ?? Other medications/items to be shipping:  n/a    Patient specific needs were assessed and addressed:  language differences, literacy level, cultural barriers, cognitive and/or physical impairments.      All questions were answered and contact information provided for any future questions/concerns.      Jeneen Montgomery

## 2018-02-03 MED FILL — MYCOPHENOLIC ACID DR/360MG DR/TBEC: MYCOPHENOLIC ACID DR/360MG DR/TBEC | 90 days supply | Qty: 180 | Fill #0

## 2018-02-06 LAB — DS-DNA TITER: Lab: 1:20 {titer}

## 2018-02-06 LAB — ANTI-DNA ANTIBODY, DOUBLE-STRANDED: DSDNA ANTIBODY: POSITIVE — AB

## 2018-02-08 ENCOUNTER — Telehealth: Payer: Self-pay | Admitting: Family Medicine

## 2018-02-08 NOTE — Telephone Encounter (Signed)
Copied from [place] [id number]. Topic: Quick Communication - Rx Refill/Question >> Feb 08, 2018  3:45 PM Waylan Rocher, Lumin L wrote: Medication: telmisartan-hydrochlorothiazide (MICARDIS HCT) 80-25 MG (out of medication)   Has the patient contacted their pharmacy? Yes.   (Agent: If no, request that the patient contact the pharmacy for the refill.) (Agent: If yes, when and what did the pharmacy advise?)  Preferred Pharmacy (with phone number or street name): [facility] [place] [place] [address] [place] [address] Phone: [phone] Fax: [phone]  Agent: Please be advised that RX refills may take up to 3 business days. We ask that you follow-up with your pharmacy.  Patient would like a call once sent to pharmacy. Would like a call back from nurse as soon as possible because she wants them sent today.

## 2018-02-08 NOTE — Telephone Encounter (Signed)
Called [employer] [facility] because patient was given a 90 day prescription on 01/05/18 for her Micardis HCT 80-25 mg. [facility] stated it was too early to fill at the time they received the prescription so the patient has to call to get it sent out when she is needing the medication. Asked them to rush ship this BP medication due to the patient being completely out. They said they are express shipping it but the patient stated she needs some until it gets here. Per Dr. Ancil Boozer ok to send in a gap of her medication to [facility] until her mail order comes. Patient notified.

## 2018-02-09 ENCOUNTER — Other Ambulatory Visit: Payer: Self-pay | Admitting: Family Medicine

## 2018-02-09 DIAGNOSIS — I10 Essential (primary) hypertension: Secondary | ICD-10-CM

## 2018-02-13 NOTE — Unmapped (Signed)
Called patient to discuss that dsDNA returned 1:20. Much lower than it has been in the distant past but over past year has been negative. Complements normal so unclear clinical significance. Main concern was elevated creatinine and we had planned to recheck. She has not gone to lab yet but states she will. Will add on repeat dsDNA, C3, C4 as well as urine studies with creatinine to be checked. She appreciated phone call.

## 2018-02-14 ENCOUNTER — Encounter: Admit: 2018-02-14 | Discharge: 2018-02-15 | Payer: MEDICARE

## 2018-02-14 DIAGNOSIS — M329 Systemic lupus erythematosus, unspecified: Secondary | ICD-10-CM | POA: Diagnosis not present

## 2018-02-14 DIAGNOSIS — Z79899 Other long term (current) drug therapy: Secondary | ICD-10-CM | POA: Diagnosis not present

## 2018-02-14 LAB — PROTEIN / CREATININE RATIO, URINE
CREATININE, URINE: 188.1 mg/dL
PROTEIN URINE: 8.4 mg/dL
PROTEIN/CREAT RATIO, URINE: 0.045

## 2018-02-14 LAB — URINALYSIS WITH CULTURE REFLEX
BACTERIA: NONE SEEN /HPF
BILIRUBIN UA: NEGATIVE
BLOOD UA: NEGATIVE
GLUCOSE UA: NEGATIVE
KETONES UA: NEGATIVE
NITRITE UA: NEGATIVE
PH UA: 6 (ref 5.0–9.0)
PROTEIN UA: 30 — AB
RBC UA: 1 /HPF (ref <=4)
SQUAMOUS EPITHELIAL: 2 /HPF (ref 0–5)
UROBILINOGEN UA: 0.2
WBC UA: 2 /HPF (ref 0–5)

## 2018-02-14 LAB — CREATININE, URINE: Lab: 188.1

## 2018-02-14 LAB — EGFR MDRD AF AMER: Glomerular filtration rate/1.73 sq M.predicted.black:ArVRat:Pt:Ser/Plas/Bld:Qn:Creatinine-based formula (MDRD): >=60

## 2018-02-14 LAB — C3 COMPLEMENT: Complement C3:MCnc:Pt:Ser/Plas:Qn:: 104

## 2018-02-14 LAB — BLOOD UA: Lab: NEGATIVE

## 2018-02-14 LAB — C4 COMPLEMENT: Complement C4:MCnc:Pt:Ser/Plas:Qn:: 23.3

## 2018-02-14 NOTE — Unmapped (Addendum)
Talked to her about the Cr 1.31 at recent rheumatology visit.     She presented to our clinic today to have labs repeated.  I will review when they result and call her back.        ----- Message from Janeece Fitting Trollinger sent at 02/09/2018 10:48 AM EDT -----  Regarding: Creatine levels  Contact: [phone]      Labs online stated they are really high. She needs a return appointment for 11/2018? ust let me know what you would like for her to do.    Thanks,  [place]

## 2018-02-14 NOTE — Unmapped (Signed)
Labs drawn per provider.

## 2018-02-15 LAB — DSDNA ANTIBODY: Lab: NEGATIVE

## 2018-02-17 ENCOUNTER — Other Ambulatory Visit: Payer: Self-pay | Admitting: Family Medicine

## 2018-02-17 DIAGNOSIS — I10 Essential (primary) hypertension: Secondary | ICD-10-CM

## 2018-02-21 ENCOUNTER — Ambulatory Visit: Payer: Medicare Other | Admitting: Family Medicine

## 2018-02-22 ENCOUNTER — Other Ambulatory Visit: Payer: Self-pay | Admitting: Family Medicine

## 2018-02-22 MED ORDER — CONJUGATED ESTROGENS 0.625 MG/GRAM VAGINAL CREAM
VAGINAL | 9 refills | 0 days | Status: CP
Start: 2018-02-22 — End: 2018-09-20

## 2018-02-22 NOTE — Telephone Encounter (Signed)
Refill Request for Cholesterol medication. Lipitor 40 mg  Last physical: None indicated  Lab Results  Component Value Date   CHOL 220 (H) 10/31/2017   HDL 62 10/31/2017   LDLCALC 138 (H) 10/31/2017   TRIG 101 10/31/2017   CHOLHDL 3.5 10/31/2017    Follow up visit: None indicated
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
## 2018-02-26 ENCOUNTER — Encounter: Admit: 2018-02-26 | Discharge: 2018-02-27 | Payer: MEDICARE

## 2018-02-26 ENCOUNTER — Encounter
Admit: 2018-02-26 | Discharge: 2018-02-27 | Payer: MEDICARE | Attending: Critical Care Medicine | Primary: Critical Care Medicine

## 2018-02-26 DIAGNOSIS — J8489 Other specified interstitial pulmonary diseases: Secondary | ICD-10-CM | POA: Diagnosis not present

## 2018-02-26 DIAGNOSIS — Z7951 Long term (current) use of inhaled steroids: Secondary | ICD-10-CM | POA: Diagnosis not present

## 2018-02-26 DIAGNOSIS — Z7952 Long term (current) use of systemic steroids: Secondary | ICD-10-CM | POA: Diagnosis not present

## 2018-02-26 DIAGNOSIS — N181 Chronic kidney disease, stage 1: Secondary | ICD-10-CM | POA: Diagnosis not present

## 2018-02-26 DIAGNOSIS — K219 Gastro-esophageal reflux disease without esophagitis: Secondary | ICD-10-CM | POA: Diagnosis not present

## 2018-02-26 DIAGNOSIS — I129 Hypertensive chronic kidney disease with stage 1 through stage 4 chronic kidney disease, or unspecified chronic kidney disease: Secondary | ICD-10-CM | POA: Diagnosis not present

## 2018-02-26 DIAGNOSIS — H905 Unspecified sensorineural hearing loss: Secondary | ICD-10-CM | POA: Diagnosis not present

## 2018-02-26 DIAGNOSIS — G894 Chronic pain syndrome: Secondary | ICD-10-CM | POA: Diagnosis not present

## 2018-02-26 DIAGNOSIS — M797 Fibromyalgia: Secondary | ICD-10-CM | POA: Diagnosis not present

## 2018-02-26 DIAGNOSIS — R0602 Shortness of breath: Secondary | ICD-10-CM | POA: Diagnosis not present

## 2018-02-26 DIAGNOSIS — M329 Systemic lupus erythematosus, unspecified: Secondary | ICD-10-CM | POA: Diagnosis not present

## 2018-02-26 DIAGNOSIS — Z79899 Other long term (current) drug therapy: Secondary | ICD-10-CM | POA: Diagnosis not present

## 2018-02-26 DIAGNOSIS — Z7982 Long term (current) use of aspirin: Secondary | ICD-10-CM | POA: Diagnosis not present

## 2018-02-26 DIAGNOSIS — Z880 Allergy status to penicillin: Secondary | ICD-10-CM | POA: Diagnosis not present

## 2018-02-26 DIAGNOSIS — Z888 Allergy status to other drugs, medicaments and biological substances status: Secondary | ICD-10-CM | POA: Diagnosis not present

## 2018-02-26 MED ORDER — PREDNISONE 2.5 MG TABLET
ORAL_TABLET | Freq: Every day | ORAL | 0 refills | 0 days | Status: CP
Start: 2018-02-26 — End: 2018-07-10

## 2018-02-26 MED ORDER — PROAIR HFA 90 MCG/ACTUATION AEROSOL INHALER
Freq: Four times a day (QID) | RESPIRATORY_TRACT | 4 refills | 0 days | Status: CP | PRN
Start: 2018-02-26 — End: 2019-01-22

## 2018-02-26 MED ORDER — ALBUTEROL SULFATE HFA 90 MCG/ACTUATION AEROSOL INHALER
Freq: Four times a day (QID) | RESPIRATORY_TRACT | 3 refills | 0 days | Status: CP | PRN
Start: 2018-02-26 — End: 2018-02-26

## 2018-02-26 NOTE — Unmapped (Signed)
Per pt, her insurance will not cover the cost of her proair any longer.  This nurse contacted [facility] and was informed the prescription for proair must be written as DAW 1 in order for the insurance to cover the cost.  New prescription sent in for the Brand.  [facility] LPN

## 2018-02-26 NOTE — Unmapped (Signed)
Continue 5 mg of prednisone daily for the next 6 weeks. Then reduce your dose to 2.5 mg daily until I see you again.   Please call if you have any new concerning chest symptoms.     Please take your medications as prescribed.  Thank you for allowing me to be a part of your care.  Please call the clinic with any questions.    Viviann Spare, MD  Pulmonary and Critical Care Medicine  [address]  [id number]  [place], [place] [address]    Thank you for your visit to the [facility].  You may receive a survey from [facility] regarding your visit today, and we are eager to use this feedback to improve your experience.  Thank you for taking the time to fill it out.    Between appointments, you can reach [place] at these numbers:    For appointments or the Pulmonary Nurse: [phone]  Fax: [phone]    For urgent issues after hours:  Hospital Operator: [phone], ask for Pulmonary Fellow on call

## 2018-02-26 NOTE — Unmapped (Signed)
Called patient to let her know Dr. Lu Duffel refilled her estrogen cream. Advised pt that if it does not relieve her symptoms to call back.

## 2018-02-27 ENCOUNTER — Encounter: Admit: 2018-02-27 | Discharge: 2018-02-28 | Payer: MEDICARE | Attending: Psychiatry | Primary: Psychiatry

## 2018-02-27 DIAGNOSIS — R413 Other amnesia: Secondary | ICD-10-CM

## 2018-02-27 DIAGNOSIS — F329 Major depressive disorder, single episode, unspecified: Principal | ICD-10-CM

## 2018-02-27 DIAGNOSIS — F411 Generalized anxiety disorder: Secondary | ICD-10-CM

## 2018-02-27 DIAGNOSIS — G4700 Insomnia, unspecified: Secondary | ICD-10-CM

## 2018-02-27 MED ORDER — LAMOTRIGINE 200 MG TABLET
ORAL_TABLET | Freq: Two times a day (BID) | ORAL | 1 refills | 0 days | Status: CP
Start: 2018-02-27 — End: 2018-06-12

## 2018-02-27 MED ORDER — BUPROPION HCL XL 300 MG 24 HR TABLET, EXTENDED RELEASE
ORAL_TABLET | Freq: Every morning | ORAL | 5 refills | 0.00000 days | Status: CP
Start: 2018-02-27 — End: 2018-04-13

## 2018-02-27 MED ORDER — BUPROPION HCL XL 150 MG 24 HR TABLET, EXTENDED RELEASE
ORAL_TABLET | Freq: Every day | ORAL | 5 refills | 0.00000 days | Status: CP
Start: 2018-02-27 — End: 2018-06-12

## 2018-02-27 MED ORDER — CLONAZEPAM 0.5 MG TABLET
ORAL_TABLET | Freq: Every evening | ORAL | 2 refills | 0 days | Status: CP
Start: 2018-02-27 — End: 2018-04-09

## 2018-02-27 MED ORDER — TRAZODONE 100 MG TABLET
ORAL_TABLET | Freq: Every evening | ORAL | 1 refills | 0 days | Status: CP
Start: 2018-02-27 — End: 2018-06-12

## 2018-02-27 MED ORDER — DULOXETINE 60 MG CAPSULE,DELAYED RELEASE
ORAL_CAPSULE | Freq: Every day | ORAL | 1 refills | 0 days | Status: CP
Start: 2018-02-27 — End: 2018-06-12

## 2018-02-27 NOTE — Unmapped (Addendum)
The clinic has moved effective June 13, 2017!  New Address:  [facility]  [address]  [address]  [place], [place] [address]    Follow-up instructions:  -- Please continue taking your medications as prescribed for your mental health.   - Continue Lamictal 400mg  (two pills) nightly  - Continue Cymbalta 120mg  (two pills) daily in the morning  - Continue Trazodone 200mg  (two pills) nightly  - Continue Wellbutrin XL 450mg  (one 300 mg pill + one 150 mg pill) daily in the morning   - Continue Clonazepam 0.25mg  (half pill) nightly  - Try finding some other social events at the [place] or other things in the area. Consider other senior centers or [employer].  - [email or web address] is a great website to look for other volunteer opportunities.  - You will see Dr. Maple Mirza the next time you come in. You will still see Dr. Evette Cristal or Dr. Gunnar Bulla when you come in as the attending psychiatrist.  -- Do not make changes to your medications, including taking more or less than prescribed, unless under the supervision of your physician. Be aware that some medications may make you feel worse if abruptly stopped  -- Please refrain from using illicit substances, as these can affect your mood and could cause anxiety or other concerning symptoms.   -- Seek further medical care for any increase in symptoms or new symptoms such as thoughts of wanting to hurt yourself or hurt others.     Contact info:  Life-threatening emergencies: call 911 or go to the nearest ER for medical or psychiatric attention.     Issues that need urgent attention but are not life threatening: call the outpatient clinic at [phone] for assistance.     Non-urgent routine concerns, questions, and refill requests: please leave me (Dr. Mitzi Davenport Latrenda Irani) a voicemail at [phone] and I will get back to you within 2 business days.     Regarding appointments:  - If you need to cancel your appointment, we ask that you call [phone] at least 24 hours before your scheduled appointment.  - If for any reason you arrive 15 minutes later than your scheduled appointment time, you may not be seen and your visit may be rescheduled.  - Please remember that we will not automatically reschedule missed appointments.  - If you no show, arrive greater than 15 minutes late, or cancel within 24 hours of the scheduled appointment three (3) times with an individual clinic or provider, you can be dismissed from the clinic and will likely be referred to a provider in your community.  - We will do our best to be on time. Sometimes an emergency will arise that might cause your clinician to be late. We will try to inform you of this when you check in for your appointment. If you wait more than 15 minutes past your appointment time without such notice, please speak with the front desk staff.    In the event of bad weather, the clinic staff will attempt to contact you, should your appointment need to be rescheduled. Additionally, you can call the Patient Weather Line [phone] for system-wide clinic status    For more information and reminders regarding clinic policies (these were provided when you were admitted to the clinic), please ask the front desk.

## 2018-02-27 NOTE — Unmapped (Signed)
Patient's refill call was scheduled early  Pt received 90 days of myfortic with first fill  Rescheduling call for 8/9 as that is the first day ins will pay for medication    Everlean Cherry CPHT    Patient was very confused as she had plenty on hand. I apologized for the early call/confusion

## 2018-02-28 ENCOUNTER — Emergency Department
Admission: EM | Admit: 2018-02-28 | Discharge: 2018-02-28 | Disposition: A | Discharge status: Home / Self Care | Payer: Medicare Other | Attending: Emergency Medicine | Admitting: Emergency Medicine

## 2018-02-28 ENCOUNTER — Emergency Department: Payer: Medicare Other

## 2018-02-28 ENCOUNTER — Ambulatory Visit: Payer: Self-pay | Admitting: Family Medicine

## 2018-02-28 ENCOUNTER — Other Ambulatory Visit: Payer: Self-pay

## 2018-02-28 DIAGNOSIS — K5900 Constipation, unspecified: Secondary | ICD-10-CM | POA: Insufficient documentation

## 2018-02-28 DIAGNOSIS — E119 Type 2 diabetes mellitus without complications: Secondary | ICD-10-CM | POA: Diagnosis not present

## 2018-02-28 DIAGNOSIS — Z79899 Other long term (current) drug therapy: Secondary | ICD-10-CM | POA: Insufficient documentation

## 2018-02-28 DIAGNOSIS — R1031 Right lower quadrant pain: Secondary | ICD-10-CM

## 2018-02-28 DIAGNOSIS — I10 Essential (primary) hypertension: Secondary | ICD-10-CM | POA: Insufficient documentation

## 2018-02-28 DIAGNOSIS — Z7984 Long term (current) use of oral hypoglycemic drugs: Secondary | ICD-10-CM | POA: Diagnosis not present

## 2018-02-28 HISTORY — DX: Type 2 diabetes mellitus without complications: E11.9

## 2018-02-28 LAB — COMPREHENSIVE METABOLIC PANEL
ALBUMIN: 4.2 g/dL (ref 3.5–5.0)
ALK PHOS: 65 U/L (ref 38–126)
ALT: 19 U/L (ref 0–44)
AST: 28 U/L (ref 15–41)
Anion gap: 12 (ref 5–15)
BILIRUBIN TOTAL: 0.6 mg/dL (ref 0.3–1.2)
BUN: 13 mg/dL (ref 6–20)
CALCIUM: 9.3 mg/dL (ref 8.9–10.3)
CO2: 25 mmol/L (ref 22–32)
CREATININE: 0.84 mg/dL (ref 0.44–1.00)
Chloride: 100 mmol/L (ref 98–111)
GFR calc Af Amer: >60 mL/min (ref >60)
Glucose, Bld: 80 mg/dL (ref 70–99)
Potassium: 3.9 mmol/L (ref 3.5–5.1)
Sodium: 137 mmol/L (ref 135–145)
TOTAL PROTEIN: 8.4 g/dL — AB (ref 6.5–8.1)

## 2018-02-28 LAB — URINALYSIS, COMPLETE (UACMP) WITH MICROSCOPIC
BACTERIA UA: NONE SEEN
Glucose, UA: NEGATIVE mg/dL
HGB URINE DIPSTICK: NEGATIVE
Ketones, ur: 5 mg/dL — AB
NITRITE: NEGATIVE
Protein, ur: 30 mg/dL — AB
SPECIFIC GRAVITY, URINE: 1.03 (ref 1.005–1.030)
pH: 5 (ref 5.0–8.0)

## 2018-02-28 LAB — CBC
HCT: 40.4 % (ref 35.0–47.0)
Hemoglobin: 13.6 g/dL (ref 12.0–16.0)
MCH: 29.2 pg (ref 26.0–34.0)
MCHC: 33.7 g/dL (ref 32.0–36.0)
MCV: 86.7 fL (ref 80.0–100.0)
PLATELETS: 325 10*3/uL (ref 150–440)
RBC: 4.67 MIL/uL (ref 3.80–5.20)
RDW: 14.8 % — AB (ref 11.5–14.5)
WBC: 5.3 10*3/uL (ref 3.6–11.0)

## 2018-02-28 LAB — LIPASE, BLOOD: Lipase: 25 U/L (ref 11–51)

## 2018-02-28 MED ORDER — IOPAMIDOL (ISOVUE-300) INJECTION 61%
100.0000 mL | Freq: Once | INTRAVENOUS | Status: AC | PRN
  Administered 2018-02-28: 100 mL via INTRAVENOUS
  Filled 2018-02-28: qty 100

## 2018-02-28 NOTE — Telephone Encounter (Signed)
No availability at [facility] today - Patient advised to proceed to ER  She was advised to STAY NPO  Pt advised that [facility] would be good option    Reason for Disposition . [1] MILD-MODERATE pain AND [2] constant AND [3] present > 2 hours  Answer Assessment - Initial Assessment Questions 1. LOCATION: "Where does it hurt?"       At bottom of stomach on r side   2. RADIATION: "Does the pain shoot anywhere else?" (e.g., chest, back)       Radiates to r side   3. ONSET: "When did the pain begin?" (e.g., minutes, hours or days ago)        2 days ago   4. SUDDEN: "Gradual or sudden onset?"         Sudden   5. PATTERN "Does the pain come and go, or is it constant?"    - If constant: "Is it getting better, staying the same, or worsening?"      (Note: Constant means the pain never goes away completely; most serious pain is constant and it progresses)     - If intermittent: "How long does it last?" "Do you have pain now?"     (Note: Intermittent means the pain goes away completely between bouts)       Constant   6. SEVERITY: "How bad is the pain?"  (e.g., Scale 1-10; mild, moderate, or severe)   - MILD (1-3): doesn't interfere with normal activities, abdomen soft and not tender to touch    - MODERATE (4-7): interferes with normal activities or awakens from sleep, tender to touch    - SEVERE (8-10): excruciating pain, doubled over, unable to do any normal activities        Moderate pain   7. RECURRENT SYMPTOM: "Have you ever had this type of abdominal pain before?" If so, ask: "When was the last time?" and "What happened that time?"         No 8. CAUSE: "What do you think is causing the abdominal pain?"         No 9. RELIEVING/AGGRAVATING FACTORS: "What makes it better or worse?" (e.g., movement, antacids, bowel movement)     No 10. OTHER SYMPTOMS: "Has there been any vomiting, diarrhea, constipation, or urine problems?"         No  11. PREGNANCY: "Is there any chance you are  pregnant?" "When was your last menstrual period?"       N/A  Protocols used: ABDOMINAL PAIN - [facility]

## 2018-02-28 NOTE — ED Triage Notes (Signed)
Pt c/o RLQ abd pain x few days. Nausea but denies vomiting,diarrhea, fever. Alert, oriented, ambulatory. Unsure if still has appendix and gallbladder. Denies urinary symptoms.

## 2018-02-28 NOTE — ED Provider Notes (Signed)
[facility] Emergency Department Provider Note  ____________________________________________  Time seen: Approximately 7:59 PM  I have reviewed the triage vital signs and the nursing notes.   HISTORY  Chief Complaint Abdominal Pain   HPI Nicole Ballard is a 62 y.o. female with a history as listed below including lupus, fibromyalgia, diabetes, hypertension, hyperlipidemia, GERD, diverticulosis who presents for evaluation of abdominal pain.  Patient reports 2 to 3 days of dull constant 6 out of 10 right lower quadrant abdominal pain associated with nausea but no vomiting.  No diarrhea, no constipation, no fever, no chills, no dysuria or hematuria, no vaginal discharge. Last BM was yesterday.  Patient has had a cholecystectomy and hysterectomy but no other abdominal surgeries.  She has tried Tylenol at home with some relief.  Past Medical History:  Diagnosis Date  . Anxiety   . Bipolar 1 disorder ([place])   . Chronic kidney disease   . Chronic pain   . Diabetes mellitus without complication ([place])   . Fibromyalgia   . GERD (gastroesophageal reflux disease)   . Hearing loss of right ear   . Hemifacial spasm   . Hyperlipidemia   . Hypertension   . Insomnia   . Lumbago   . Osteoarthritis   . Paresthesia   . Rhinitis   . Systemic lupus erythematosus ([place])   . Vitamin D deficiency     Patient Active Problem List   Diagnosis Date Noted  . Hyperglycemia 10/23/2017  . Vertigo 09/29/2016  . Bronchiolitis obliterans organizing pneumonia ([place]) 09/29/2016  . Neck pain, musculoskeletal 12/15/2015  . Anxiety about health 09/29/2015  . Abnormal CAT scan 04/11/2015  . Auditory impairment 04/11/2015  . Insomnia, persistent 04/11/2015  . Chronic kidney disease (CKD), stage I 04/11/2015  . Chronic nonmalignant pain 04/11/2015  . Chronic constipation 04/11/2015  . Dyslipidemia 04/11/2015  . Fibromyalgia 04/11/2015  . Gastro-esophageal reflux disease without  esophagitis 04/11/2015  . Dysphonia 04/11/2015  . Absence of bladder continence 04/11/2015  . Low back pain 04/11/2015  . Eczema intertrigo 04/11/2015  . Keratosis pilaris 04/11/2015  . Chronic recurrent major depressive disorder ([place]) 04/11/2015  . Dysmetabolic syndrome 97/67/3419  . Neurogenic claudication 04/11/2015  . Perennial allergic rhinitis with seasonal variation 04/11/2015  . Abnormal presence of protein in urine 04/11/2015  . Seborrhea capitis 04/11/2015  . Moderate dysplasia of vulva 04/11/2015  . Vitamin D deficiency 04/11/2015  . Treadmill stress test negative for angina pectoris 03/05/2015  . Shortness of breath 05/20/2014  . Morbid obesity ([place]) 02/03/2014  . Asthma, chronic 12/31/2013  . H/O total knee replacement 12/31/2013  . Episcleritis 09/26/2013  . Vocal cord dysfunction 05/28/2013  . Anxiety, generalized 03/19/2013  . Back pain 12/18/2012  . Pulmonary nodules 11/26/2012  . Lung nodule, multiple 11/26/2012  . Arthritis, degenerative 11/01/2012  . H/O aspiration pneumonitis 10/22/2012  . Postinflammatory pulmonary fibrosis ([place]) 10/22/2012  . Mixed incontinence 05/04/2012  . Benign neoplasm of pituitary gland and craniopharyngeal duct ([place]) 12/29/2010  . Lung involvement in systemic lupus erythematosus ([place]) 11/05/2010  . Chronic nausea 07/01/2008  . Benign hypertension 01/25/2005    Past Surgical History:  Procedure Laterality Date  . BRAIN SURGERY    . CHOLECYSTECTOMY    . VAGINAL HYSTERECTOMY      Prior to Admission medications   Medication Sig Start Date End Date Taking? Authorizing Provider  albuterol (PROAIR HFA) 108 (90 BASE) MCG/ACT inhaler Inhale 2 puffs into the lungs every 6 (six) hours as needed  for wheezing. DX code R06.02 02/24/15   Juanito Doom, MD  amLODipine (NORVASC) 2.5 MG tablet TAKE 1 TABLET BY MOUTH  DAILY 01/19/18   Steele Sizer, MD  aspirin EC 81 MG tablet Take 1 tablet (81 mg total) by mouth daily. 10/31/17   Steele Sizer, MD  atorvastatin (LIPITOR) 40 MG tablet TAKE 1 TABLET BY MOUTH  DAILY 02/22/18   Steele Sizer, MD  azelastine (OPTIVAR) 0.05 % ophthalmic solution Place 1 drop into both eyes 2 (two) times daily. 01/19/17   Hubbard Hartshorn, FNP  bisacodyl (DULCOLAX) 10 MG suppository Place rectally. 03/28/15   [provider]  buPROPion (WELLBUTRIN XL) 300 MG 24 hr tablet Take 1 tablet by mouth daily. 12/27/16   [provider]  Cholecalciferol (VITAMIN D) 2000 UNITS CAPS Take 1 capsule by mouth daily.    [provider]  clindamycin (CLEOCIN) 300 MG capsule TK 2 CS PO WITHIN 1 HOUR OF ANY DENTAL PROCEDURE 10/17/17   [provider]  clonazePAM (KLONOPIN) 0.5 MG tablet Take 0.5 tablets (0.25 mg total) by mouth daily. 03/16/16   Steele Sizer, MD  cyclobenzaprine (FLEXERIL) 5 MG tablet TAKE 1 TABLET BY MOUTH  EVERY 8 HOURS AS NEEDED FOR MUSCLE SPASMS. DO NOT DRIVE FOR 8 HOURS 11/11/43   Steele Sizer, MD  diclofenac sodium (VOLTAREN) 1 % GEL APP 2 GRAMS EXT AA QID 04/17/15   [provider]  dicyclomine (BENTYL) 20 MG tablet Take 1 tablet (20 mg total) by mouth every 6 (six) hours as needed (Abdominal Cramping/Pain). 06/23/17   Hubbard Hartshorn, FNP  docusate sodium (COLACE) 100 MG capsule Take by mouth.    [provider]  DULoxetine (CYMBALTA) 60 MG capsule Take 120 mg by mouth at bedtime.     [provider]  FLOVENT HFA 110 MCG/ACT inhaler INHALE 2 PUFFS PO BID 07/28/15   [provider]  fluticasone (FLONASE) 50 MCG/ACT nasal spray USE 2 SPRAYS IN EACH  NOSTRIL DAILY 09/12/17   Steele Sizer, MD  gabapentin (NEURONTIN) 300 MG capsule Take 1 capsule by mouth 3 (three) times daily. 10/30/17   [provider]  gentamicin (GARAMYCIN) 0.3 % ophthalmic solution Place 2 drops into both eyes 4 (four) times daily. 04/28/17   Steele Sizer, MD  hydroxychloroquine (PLAQUENIL) 200 MG tablet Take 200 mg by mouth 2 (two) times daily.     [provider]  lamoTRIgine (LAMICTAL) 200 MG tablet Take 1 tablet (200 mg total) by mouth 2 (two) times daily. 07/31/17   Steele Sizer, MD  meclizine (ANTIVERT) 25 MG tablet TAKE 1 TABLET BY MOUTH 3  TIMES DAILY AS NEEDED 09/12/17   Steele Sizer, MD  metFORMIN (GLUCOPHAGE-XR) 500 MG 24 hr tablet Take 1 tablet (500 mg total) by mouth daily with breakfast. 10/31/17   Steele Sizer, MD  Multiple Vitamin (MULTIVITAMIN) tablet Take 1 tablet by mouth daily.    [provider]  mycophenolate (CELLCEPT) 500 MG tablet Take 2 tablets (1,000 mg total) by mouth 2 (two) times daily. 06/23/17   Hubbard Hartshorn, FNP  olopatadine (PATANOL) 0.1 % ophthalmic solution Place 1 drop into both eyes 2 (two) times daily. 08/04/17   [provider]  omeprazole (PRILOSEC) 40 MG capsule TAKE 1 CAPSULE BY MOUTH  DAILY 07/21/17   Ancil Boozer, Drue Stager, MD  ondansetron (ZOFRAN) 4 MG tablet TAKE 1 TABLET BY MOUTH  EVERY 8 HOURS AS NEEDED FOR NAUSEA AND VOMITING 01/17/18   Steele Sizer, MD  polyethylene glycol  powder (GLYCOLAX/MIRALAX) powder 2 cap fulls in a full glass of water, three times a day, for 5 days. 07/31/17   Steele Sizer, MD  prednisoLONE acetate (PRED FORTE) 1 % ophthalmic suspension Place 1 drop into both eyes 2 (two) times daily as needed. 08/04/17   [provider]  predniSONE (DELTASONE) 2.5 MG tablet Take 7.5 mg by mouth daily. 10/17/17   [provider]  promethazine (PHENERGAN) 25 MG tablet  08/06/17   [provider]  ranitidine (ZANTAC) 150 MG tablet Take 1 tablet by mouth 2 (two) times daily. 09/11/17   [provider]  telmisartan-hydrochlorothiazide (MICARDIS HCT) 80-25 MG tablet TAKE 1 TABLET BY MOUTH EVERY DAY 02/17/18   Ancil Boozer, Drue Stager, MD  topiramate (TOPAMAX) 50 MG tablet Take 1 tablet by mouth 3 (three) times daily. Dr. [place] James - Pain management 01/04/16   Jodell Cipro, MD  traZODone (DESYREL) 100 MG tablet Take 1 tablet by  mouth at bedtime. 12/27/16   [provider]  triamcinolone ointment (KENALOG) 0.1 %  10/26/16   [provider]  YUVAFEM 10 MCG TABS vaginal tablet Place 1 tablet vaginally once a week. 07/04/17   [provider]    Allergies Ace inhibitors and Penicillins  Family History  Problem Relation Age of Onset  . Breast cancer Mother   . Cancer Mother 43       Breast  . Cancer Father 9       Stomach  . Breast cancer Maternal Aunt     Social History Social History   Tobacco Use  . Smoking status: Never Smoker  . Smokeless tobacco: Never Used  Substance Use Topics  . Alcohol use: No    Alcohol/week: 0.0 oz  . Drug use: No    Review of Systems  Constitutional: Negative for fever. Eyes: Negative for visual changes. ENT: Negative for sore throat. Neck: No neck pain  Cardiovascular: Negative for chest pain. Respiratory: Negative for shortness of breath. Gastrointestinal: + RLQ abdominal pain and nausea. No vomiting or diarrhea. Genitourinary: Negative for dysuria. Musculoskeletal: Negative for back pain. Skin: Negative for rash. Neurological: Negative for headaches, weakness or numbness. Psych: No SI or HI  ____________________________________________   PHYSICAL EXAM:  VITAL SIGNS: ED Triage Vitals  Enc Vitals Group     BP 02/28/18 1646 130/67     Pulse Rate 02/28/18 1646 80     Resp 02/28/18 1646 16     Temp 02/28/18 1646 98.4 F (36.9 C)     Temp Source 02/28/18 1646 Oral     SpO2 02/28/18 1646 95 %     Weight 02/28/18 1647 222 lb (100.7 kg)     Height 02/28/18 1647 5\' 8"  (1.727 m)     Head Circumference --      Peak Flow --      Pain Score 02/28/18 1647 5     Pain Loc --      Pain Edu? --      Excl. in [place]? --     Constitutional: Alert and oriented. Well appearing and in no apparent distress. HEENT:      Head: Normocephalic and atraumatic.         Eyes: Conjunctivae are normal. Sclera is non-icteric.       Mouth/Throat: Mucous  membranes are moist.       Neck: Supple with no signs of meningismus. Cardiovascular: Regular rate and rhythm. No murmurs, gallops, or rubs. 2+ symmetrical distal pulses are present in all extremities.  No JVD. Respiratory: Normal respiratory effort. Lungs are clear to auscultation bilaterally. No wheezes, crackles, or rhonchi.  Gastrointestinal: Soft, tender to palpation on the right quadrants worse in the right lower quadrant, and non distended with positive bowel sounds. No rebound or guarding. Genitourinary: No CVA tenderness. Musculoskeletal: Nontender with normal range of motion in all extremities. No edema, cyanosis, or erythema of extremities. Neurologic: Normal speech and language. Face is symmetric. Moving all extremities. No gross focal neurologic deficits are appreciated. Skin: Skin is warm, dry and intact. No rash noted. Psychiatric: Mood and affect are normal. Speech and behavior are normal.  ____________________________________________   LABS (all labs ordered are listed, but only abnormal results are displayed)  Labs Reviewed  COMPREHENSIVE METABOLIC PANEL - Abnormal; Notable for the following components:      Result Value   Total Protein 8.4 (*)    All other components within normal limits  CBC - Abnormal; Notable for the following components:   RDW 14.8 (*)    All other components within normal limits  URINALYSIS, COMPLETE (UACMP) WITH MICROSCOPIC - Abnormal; Notable for the following components:   Color, Urine AMBER (*)    APPearance CLEAR (*)    Bilirubin Urine SMALL (*)    Ketones, ur 5 (*)    Protein, ur 30 (*)    Leukocytes, UA TRACE (*)    All other components within normal limits  LIPASE, BLOOD   ____________________________________________  EKG  none  ____________________________________________  RADIOLOGY  I have personally reviewed the images performed during this visit and I agree with the Radiologist's read.   Interpretation by Radiologist:    Ct Abdomen Pelvis W Contrast  Result Date: 02/28/2018 CLINICAL DATA:  Right-sided abdominal pain with nausea x4 days. EXAM: CT ABDOMEN AND PELVIS WITH CONTRAST TECHNIQUE: Multidetector CT imaging of the abdomen and pelvis was performed using the standard protocol following bolus administration of intravenous contrast. CONTRAST:  152mL ISOVUE-300 IOPAMIDOL (ISOVUE-300) INJECTION 61% COMPARISON:  07/03/2013 FINDINGS: Lower chest: The included heart size is normal without pericardial effusion. No active pulmonary disease. Elevated left hemidiaphragm as before. Hepatobiliary: Cholecystectomy. Homogeneous attenuation of the liver without focal enhancing mass lesion or biliary dilatation. Pancreas: No pancreatic mass, ductal dilatation or inflammation. Spleen: Normal Adrenals/Urinary Tract: Normal bilateral adrenal glands. Symmetric cortical enhancement of both kidneys. Symmetric pyelograms on repeat delayed imaging through the kidneys. No hydroureteronephrosis. Unremarkable urinary bladder for the degree of distention. Stomach/Bowel: The stomach and small intestine are unremarkable. Significant stool retention throughout the colon consistent with constipation. Appendix is normal. Vascular/Lymphatic: No significant vascular findings are present. No enlarged abdominal or pelvic lymph nodes. Reproductive: Status post hysterectomy. No adnexal masses. Other: No abdominal wall hernia or abnormality. No abdominopelvic ascites. Musculoskeletal: No acute or significant osseous findings. IMPRESSION: Significant stool burden throughout the colon consistent with constipation. No mechanical bowel obstruction or inflammation. Normal appendix. Electronically Signed   By: [name] Royalty M.D.   On: 02/28/2018 19:46     ____________________________________________   PROCEDURES  Procedure(s) performed: None Procedures Critical Care performed:  None ____________________________________________   INITIAL IMPRESSION /  ASSESSMENT AND PLAN / ED COURSE  62 y.o. female with a history as listed below including lupus, fibromyalgia, diabetes, hypertension, hyperlipidemia, GERD, diverticulosis who presents for evaluation of 2-3 days of RLQ abdominal pain and nausea.  Patient is well-appearing, no distress, has normal vital signs, abdomen shows tenderness to palpation on the right quadrants worse in the right lower quadrant, no rebound or guarding, no distention.  Labs show normal CBC, CMP, lipase, and UA.  Differential diagnosis includes but not limited to appendicitis versus diverticulitis versus small bowel obstruction versus UTI versus pyelonephritis versus kidney stone versus pancreatitis versus gastritis or peptic ulcer disease. CT was done which showed constipation but no other acute findings. Plan to dc home on Miralax, senna, and colace and f/u with PCP. Discussed       As part of my medical decision making, I reviewed the following data within the [facility] notes reviewed and incorporated, Labs reviewed , Old chart reviewed, Radiograph reviewed , Notes from prior ED visits and [place] Controlled Substance Database    Pertinent labs & imaging results that were available during my care of the patient were reviewed by me and considered in my medical decision making (see chart for details).    ____________________________________________   FINAL CLINICAL IMPRESSION(S) / ED DIAGNOSES  Final diagnoses:  Right lower quadrant abdominal pain  Constipation, unspecified constipation type      NEW MEDICATIONS STARTED DURING THIS VISIT:  ED Discharge Orders    None       Note:  This document was prepared using Dragon voice recognition software and may include unintentional dictation errors.    Alfred Levins, [place], MD 02/28/18 2004

## 2018-02-28 NOTE — Discharge Instructions (Addendum)
Constipation: Take colace twice a day everyday. Take senna once a day at bedtime. Take Miralax 1 cap full in the morning and 1 in the evening for 5-7 days. Take daily probiotics. Drink plenty of fluids and eat a diet rich in fiber.  Follow-up with your doctor in 2 to 3 days.  Return to the emergency room for new or worsening abdominal pain, vomiting, fever.

## 2018-02-28 NOTE — ED Notes (Signed)
Patient transported to CT 

## 2018-03-02 ENCOUNTER — Ambulatory Visit: Admit: 2018-03-02 | Discharge: 2018-03-03 | Payer: MEDICARE | Attending: Psychologist | Primary: Psychologist

## 2018-03-02 DIAGNOSIS — F4310 Post-traumatic stress disorder, unspecified: Secondary | ICD-10-CM

## 2018-03-02 DIAGNOSIS — F411 Generalized anxiety disorder: Principal | ICD-10-CM

## 2018-03-02 DIAGNOSIS — F331 Major depressive disorder, recurrent, moderate: Secondary | ICD-10-CM

## 2018-03-02 DIAGNOSIS — F410 Panic disorder [episodic paroxysmal anxiety] without agoraphobia: Secondary | ICD-10-CM

## 2018-03-02 NOTE — Unmapped (Signed)
[facility]   Confidential Psychological Therapy Session      Patient Name: Caitlin Smith  Medical Record Number: [id number]  Date of Service: March 02, 2018  Attending Psychologist: Caroline More, PhD  CPT Procedure Codes: 81191 for 60 mins of face to face counseling    REFERRING PHYSICIAN: Clarene Essex, MD    CHIEF COMPLAINT AND REASON FOR VISIT: pain coping skills, CBT to address depression and anxiety in the setting of chronic pain    SUBJECTIVE / HISTORY OF PRESENT ILLNESS: Ms.  Schuelke is a very pleasant 62 y.o.  female from [place], [place] with multiple chronic pain complaints related to fibromyalgia and lupus who initially met with me in October 2016, and which time she was diagnosed with severe depression, PTSD, panic disorder, and generalized anxiety.The patient returns for a therapy session today. Last follow up with me was in 09/2017.  Patient reports doing well, reporting improved mood, improved anxiety, significant reduction in fact complete remittent of panic attacks, and improve health.  She was diagnosed with diabetes and is working on weight loss.  In the past few months she has lost 25 pounds.  She is eating lean meats, vegetables, fruits, avoiding excessive carbohydrates and cutting back on sweets.  Patient reports having more energy.  Sleeping better.  Notes GI system is more regular.  Notes she does not crave sugar.  Is also drinking less caffeine 1 cup a day versus 2-3.  Patient also complains of increased memory problems, typically short-term memory.  For example walking into a room and forgetting what she meant to do.  However also reports mixing up people's names.  Has been working with her psychiatrist.  Has been weaning off of clonazepam.  Is taking 2 half tablets now and will be weaned off in about a month.  Reports no worsening panic and no worsening anxiety even in the midst of this change.  Discussed meeting with our clinical pharmacist on the same day she returns to see me.  Scheduled this today.    Today we focused on behavioral weight loss strategies.  Discussed healthy portions  portion control and diversifying her diet because she is eating mostly the same things.  Essentially discussed a moderation approach.  Provided significant positive reinforcement.  Discussed the importance of behavioral activation.  She is getting out often.  Reports some worsening of lupus which he sold typically getting out in the mornings.    OBJECTIVE / MENTAL STATUS:    Appearance:   Appears stated age and Clean/Neat   Motor:  No abnormal movements   Speech/Language:   Normal rate, volume, tone, fluency   Mood:  Depressed and Anxious   Affect:  Blunted   Thought process:  Logical, linear, clear, coherent, goal directed   Thought content:    Denies SI, HI, self harm, delusions, obsessions, paranoid ideation, or ideas of reference   Perceptual disturbances:    Denies auditory and visual hallucinations, behavior not concerning for response to internal stimuli   Orientation:  Oriented to person, place, time, and general circumstances   Attention:  Able to fully attend without fluctuations in consciousness   Concentration:  Able to fully concentrate and attend   Memory:  Immediate, short-term, long-term, and recall grossly intact    Fund of knowledge:   Consistent with level of education and development   Insight:    Fair   Judgment:   Intact   Impulse Control:  Intact  DIAGNOSTIC IMPRESSION:   Post Traumatic Stress Disorder (PTSD)  Generalized Anxiety Disorder (GAD)  Panic Disorder  Major Depressive Disorder, moderate, recurrent  Chronic pain syndrome  Fibromyalgia  Lupus    ASSESSMENT:   Ms.  Roadcap is a very pleasant 62 y.o.  female from [place], [place] with multiple chronic pain complaints related to lupus, arthritis, and fibromyalgia. She was previously seen in our clinic from 2012-2014, and reestablished care with Dr. Fayrene Fearing in August 2016. The patient is struggling with depression and anxiety, but is very motivated to participate in intensive, multidisciplinary treatment to address the connection between depression, anxiety and pain. She has a long-standing history of depression and anxiety, and has worked with outpatient psychiatrist and therapist over the years. She is currently established with [facility] psychiatry, and is tapering off of Klonopin.  Depression, anxiety, and panic have all improved.  Has been diagnosed with diabetes, and is managing it well with p.o. medication and dietary changes.  Has lost 25 pounds.  Has increased energy, improved sleep, decreased caffeine intake.      PLAN:   (1) Psychotherapy - Continue CBT. CBT will be used to address PTSD, MDD, Panic D/o, GAD, and chronic pain.   -Follow up 05/11/2018    --Behavioral Activation - Pt has noticed worsening mood/depression. Encouraged behavioral activation. Walking 10-15 mins per day to start.  --Download and use Insight Timer, guided relaxation app, for nightly practice.  --Use food log, provided last appt  --Aggressive bowel regimen (recommended by GI Medicine, used problem solving to add to strategies today).    (2) Psychiatry - Pt currently established with [facility].  Working to taper Klonopin    (3) Nutritionist - saw a nutritionist and found it helpful. Has lost weight, related to nausea. Exercising more too.    (4) Safety - Pt denies any current or recent SI or safety concerns. Knows to call 911 or go to her local ED. Also given [facility].      (5) Follow-up with Dr. Doylene Canard to review medications particularly for sedating side effects. 05/11/2018. patient would ultimately like to address polypharmacy and the impact of any unnecessary sedation or impact on memory loss from medications.

## 2018-03-02 NOTE — Unmapped (Signed)
[facility]  Psychiatry   Established Patient E&M Service     Assessment:  Caitlin Smith is a 62 y.o. female with a history of MDD, GAD, and Panic Disorder in the context of many chronic medical conditions including chronic pain/fibromyalgia, osteoarthritis with degenerative disc disease, Lupus, pituitary tumor (benign), s/p right total knee arthroplasty, and VIN II (s/p resection), who presents for follow-up evaluation. She has been maintained on Lamictal for many years as well as cymbalta, trazodone, klonopin, and wellbutrin. Patient has previously tolerated a slight taper in her klonopin from 0.75 mg to 0.25 mg qHS overtime. Her mood is often exacerbated by steroid use for current treatment of pulmonary disease. Patient is engaged with CBT therapy at the pain clinic.    Patient reports that her mood is improved today, but it has been worsened in the context of the game center she was going to closing. Recommended other strategies for behavioral activation and patient is going to look into other social options and volunteering. Patient discussed wanting to stop her klonopin because of memory issues (likely 2/2 lupus), but ultimately decided that with resident transition and increased anxiety she is unable to at this time.     Risk Assessment:  A suicide and violence risk assessment was performed as part of this evaluation. There patient is deemed to be at chronic elevated risk for self-harm/suicide given the following factors: divorced, recent onset of serious medical condition, current diagnosis of depression, chronic severe medical condition and chronic mental illness > 5 years. The patient is deemed to be at chronic elevated risk for violence given the following factors: N/A. These risk factors are mitigated by the following factors:lack of active SI/HI, no know access to weapons or firearms, no history of violence, motivation for treatment, utilization of positive coping skills, supportive family, sense of responsibility to family and social supports, religious or spiritual prohibition to suicide/violence, current treatment compliance, safe housing and presence of a safety plan with follow-up care. There is no acute risk for suicide or violence at this time. The patient was educated about relevant modifiable risk factors including following recommendations for treatment of psychiatric illness and abstaining from substance abuse.    While future psychiatric events cannot be accurately predicted, the patient does not currently require acute inpatient psychiatric care and does not currently meet [facility] involuntary commitment criteria.      Stressors: chronic medical illness including chronic pain, lupus, physical limitations     Plan:  # Depression   - Continue Lamictal 200mg  BID for mood  - Continue Cymbalta 120mg  qAM to target depressive sx and chronic pain  - Continue Trazodone 200mg  qHS for insomnia  - Continue Wellbutrin XL 450mg  to treat depression  - Continue CBT at pain clinic    # Memory Difficulties  - MOCA 22/30 on 12/12/17 (Missed points for: Visuospatial (unable to complete pattern or copy cube), Serial 7's (able to only get 93 and 86), Language (difficulty with naming words that begin with F), Abstraction (unable to state how watch and ruler similar), and Delayed recall (2/5 on first attempt but got all 5 after category clue was given))  - Referred to memory disorders clinic, will contact Dr. Hoyle Barr regarding scheduling    # Anxiety, Panic Disorder  - Continue Cymbalta as above  - Continue Clonazepam 0.25mg  qHS, appropriate filling per NCCSRS  ??  # Medical Monitoring - High Risk Medication Use  - The complexity of this patient's care involves drug therapy which  requires intensive monitoring for toxicity. This is in part due to a narrow therapeutic window, as well as potential for toxicity. This patient is being treated with Lamotrigine (Lamictal) to target their psychiatric disorder, Major depressive disorder, refractory. In regards to this medication being considered high risk, Lamotrigine (lamictal) has black box warning for serious rash including fatal reaction of Stevens-Johnson syndrome and rare cases of toxic epidermal necrolysis. In addition to prolonged titration to mitigate risk of serious rash, lamotrigine requires monitoring of hepatic and renal function at baseline, and at times will require monitoring of lamotrigine blood levels.Marland Kitchen  LFTs   Lab Results   Component Value Date    AST 26 01/31/2018    AST 24 09/28/2017    AST 27 08/21/2017    AST 32 11/05/2014    AST 30 05/08/2014    AST 30 01/23/2014      Lab Results   Component Value Date    ALT 24 01/31/2018    ALT 20 09/28/2017    ALT 24 08/21/2017    ALT 21 11/05/2014    ALT 31 05/08/2014    ALT 30 01/23/2014   Platelets    Lab Results   Component Value Date    PLT 377 01/31/2018    PLT 372 09/28/2017    PLT 407 08/21/2017    PLT 304 11/05/2014    PLT 347 05/08/2014    PLT 484 (H) 01/23/2014   Lamotrigine Monitoring  - Hepatic function testing, platelets (via CBC) q6 months   - Last obtained 01/2018   - Due 07/2018    # Return to Clinic: Follow-up in Psychosomatic clinic in 1-2 month(s). Discussed that if she has any issues to call and schedule an appointment earlier.    Psychotherapy:  No billable psychotherapy service provided but brief supportive therapy was utilized.    Patient has been given this writer's contact information as well as the [facility] Psychiatry urgent line number. The patient has been instructed to call 911 for emergencies.    Patient was seen and plan of care was discussed with the Attending MD, Dr. Evette Cristal, who agrees with the above statement and plan.     Ree Kida Mayley Lish, MD    Subjective:     Psychiatric Chief Concern:  Follow-up psychiatric evaluation for MDD, GAD    Interval History:  Patient states that she is feeling pretty good today but that overall her mood has been more up and down since last seen in clinic. She states that she has felt more anxious. She attributes these changes to closing the game rooms she was going to almost every day and she now is leaving her house much less. 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 We discussed other behavioral activation strategies (other social events at church, scheduling outings with friends, finding a new senior center, seeing if there are any activities at [employer], volunteering) that the patient is going to look into. She has been waking up during the night and finding it difficult to go back to sleep after she goes to the bathroom. We discussed stopping liquids earlier in the evening. Patient is getting about 5 hours of sleep per night. Patient does state that her breathing is better since losing 25 lbs (intentionally) since February. She continues to express concerns about her memory and has been unable to get into contact with the neuro clinic.    Patient denies suicidal ideations, homicidal ideations, and  auditory/visual hallucinations.     NCCSRS: Reviewed; Appropriate filling. Since last visit:  Fill Date ID Written Drug Qty Days Prescriber Rx # Pharmacy Refill Daily Dose * Pymt Type PMP   02/12/2018  1   12/12/2017  Clonazepam 0.5 MG Tablet  15 30 [address]  [id number]   [facility] ([id number])  1  0.50 LME  Medicare  [place]   12/30/2017  1   12/12/2017  Clonazepam 0.5 MG Tablet  15 30 [address]  [id number]   [facility] ([id number])  0  0.50 LME  Medicare  [place]         Social History: reviewed; pertinents have been documented in the interval history section.    ROS:  As per Interval History and:  Constitutional:  fatigued  Neuro:  none / negative      Objective:    Mental Status Exam:  Appearance:    Appears stated age, Well nourished and Clean/Neat   Motor:   No abnormal movements   Speech/Language:    Normal rate, volume, tone, fluency and Language intact, well formed   Mood:   pretty good today (though endorses more up and down in the past few weeks)   Affect:   Calm, Cooperative and Euthymic   Thought process and Associations:   Logical, linear, clear, coherent, goal directed   Abnormal/psychotic thought content:     Denies SI, HI, self harm, delusions, obsessions, paranoid ideation, or ideas of reference    Perceptual disturbances:     Denies auditory and visual hallucinations, behavior not concerning for response to internal stimuli     Orientation:   Oriented to person, place, time, and general circumstances   Attention and Concentration:   Able to fully concentrate and attend   Memory:   Immediate, short-term, long-term, and recall grossly intact    Fund of knowledge:    Consistent with level of education and development   Insight:     Fair   Judgment:    Fair   Impulse Control:   Fair       Medications: reviewed at today's visit    Vitals:   Vitals:    02/27/18 0941   BP: 103/66   Pulse: 82     Vitals:    02/27/18 0941   Weight: (!) 101.2 kg (223 lb)       PE:   Vital signs were reviewed.      General: No acute distress  Pulm: Non-labored respirations  Neuro: Spontaneously moving all 4 limbs without obvious deficits.  Gait and station assessed with no abnormalities    Psychometrics:  Psych Scale Scores - Adult      Office Visit from 05/30/2017 in [facility] [place] [facility] [place] [facility]   **PHQ-9: Severity Measure for DEPRESSION Total Score**  20 Collected on 05/30/2017 0000   WHODAS 2.0 (Self-administered) - Total Score  32 Collected on 05/30/2017 0000        MOCA 22/30 on 12/12/17  Missed points for: Visuospatial (unable to complete pattern or copy cube), Serial 7's (able to only get 93 and 86), Language (difficulty with naming words that begin with F), Abstraction (unable to state how watch and ruler similar), and Delayed recall (2/5 on first attempt but got all 5 after category clue was given)    Angus Seller, MD  03/01/2018

## 2018-03-02 NOTE — Unmapped (Signed)
I saw and evaluated the patient, participating in the key portions of the service.  I reviewed the resident’s note.  I agree with the resident’s findings and plan. Sheena Donegan J Ziara Thelander, MD

## 2018-03-12 NOTE — Unmapped (Signed)
Spoke with patient, to make her aware of Dr. Ortencia Kick reply, to her question r/t imaging. Patient will call back, to schedule an earlier appointment if available. Otherwise she will follow-up with her PCP.

## 2018-03-16 NOTE — Unmapped (Signed)
Updated patient that provider would like to see her in clinic and that she would need to schedule an appointment to be seen in clinic. Patient verbalized understanding, she was then transferred to the scheduling line to have her appointment scheduled with Dr Lu Duffel either on a Thursday or Friday morning.

## 2018-03-23 ENCOUNTER — Encounter: Admit: 2018-03-23 | Discharge: 2018-03-24 | Payer: MEDICARE

## 2018-03-23 DIAGNOSIS — G5603 Carpal tunnel syndrome, bilateral upper limbs: Secondary | ICD-10-CM | POA: Diagnosis not present

## 2018-03-23 DIAGNOSIS — R200 Anesthesia of skin: Secondary | ICD-10-CM | POA: Diagnosis not present

## 2018-03-23 DIAGNOSIS — G5623 Lesion of ulnar nerve, bilateral upper limbs: Secondary | ICD-10-CM | POA: Diagnosis not present

## 2018-03-29 ENCOUNTER — Other Ambulatory Visit: Payer: Self-pay | Admitting: Family Medicine

## 2018-03-29 ENCOUNTER — Ambulatory Visit
Admit: 2018-03-29 | Discharge: 2018-03-30 | Payer: MEDICARE | Attending: Obstetrics & Gynecology | Primary: Obstetrics & Gynecology

## 2018-03-29 DIAGNOSIS — N760 Acute vaginitis: Secondary | ICD-10-CM | POA: Diagnosis not present

## 2018-03-29 DIAGNOSIS — I10 Essential (primary) hypertension: Secondary | ICD-10-CM

## 2018-03-29 DIAGNOSIS — B9689 Other specified bacterial agents as the cause of diseases classified elsewhere: Secondary | ICD-10-CM | POA: Diagnosis not present

## 2018-03-29 DIAGNOSIS — N898 Other specified noninflammatory disorders of vagina: Principal | ICD-10-CM

## 2018-03-29 DIAGNOSIS — L292 Pruritus vulvae: Secondary | ICD-10-CM

## 2018-03-29 LAB — HM PAP SMEAR: HM PAP: NEGATIVE

## 2018-03-29 MED ORDER — METRONIDAZOLE 500 MG TABLET
ORAL_TABLET | Freq: Two times a day (BID) | ORAL | 0 refills | 0.00000 days | Status: CP
Start: 2018-03-29 — End: 2018-04-05

## 2018-03-29 NOTE — Unmapped (Signed)
ASSESSMENT/PLAN:  Caitlin Smith is a 62 y.o. was seen today for  Vulvar itching  Testing done today.  I reviewed perineal hygiene practices and see patient instructions.  I don't think this is VIN but if she still has symptoms at next visit I will proceed with vulvoscopy.  At least 15 minutes were spent face to face with the patient with extensive counseling and instructions provided. 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  She has seen multiple providers across benign gynecology with similar concerns and has been reassured multiple times that her VIN 2 resolved.  She has been prescribed a number of treatments including concunut oil, estrogen, and she has been counseled on perineal hygiene, topic steroids.  She also reports she had a hysterectomy in 2000 but she does not know if her ovaries were removed.  She believes her ovaries were not removed.  But she wanted me to confirm and this wasn't possible because I could not find the op records in epic.  She reported she would bring them with her to the next visit.    SUBJECTIVE:  CC: vulvar itching  HPI: Caitlin Smith is a 62 y.o. female G2P2000   LMP:No LMP recorded. Patient has had a hysterectomy.  Today she reports   Irritation around vagina which started a few weeks ago.  No sexual partner for years  No recent hospitalizations or medical diagnoses  She is specifically asking for vinegar check of vulvar  Notably she has a complicated medial and psychiatric history and is on many medications.    CONTRIBUTORY GYNECOLOGIC HISTORY: see HPI    Objective  BP 125/66 (BP Site: L Arm, BP Position: Sitting, BP Cuff Size: Medium)  - Pulse 85  - Temp 37 ??C (98.6 ??F) (Oral)  - Ht 170.2 cm (5' 7)  - Wt 99.4 kg (219 lb 1.6 oz)  - BMI 34.32 kg/m??  Body mass index is 34.32 kg/m??.  CONSTITUTIONAL: jittery,   FA:OZHYQMV is soft without tenderness, masses, organomegaly or guarding  GU: normal external genitalia, vulva, vagina,no masses, no lesions, mucosa moist.  Neurologic:Normal muscle strength and tone and No abnormal movements observed

## 2018-03-29 NOTE — Unmapped (Addendum)
Dear Caitlin Smith,    PLAN:  1. Careful washing, Dial soap  2. Put crisco at night with saran wrap over the area of irritation, break the itch-scratch cycle  3. Return to see me in two weeks  4. Stop using the estrogen cream for now, don't put anything except Crisco on the area

## 2018-03-30 NOTE — Unmapped (Signed)
Undated patient about BV test results and notified her that she has a prescription to pick up from her pharmacy. All questions answered and patient verbalized understanding.

## 2018-03-30 NOTE — Unmapped (Signed)
BV, Needs metronidazole 250 tid for 7 days.  I will send in to her pharmacy.  I sent her a message in [place].  If she doesn't have a prescription on file then she will need a phone call with instructions.

## 2018-04-06 NOTE — Unmapped (Signed)
Pt left message about significant vulvar itching despite following all of Sr. Stuart's recommendations. Transferred pt to be scheduled to come in.

## 2018-04-09 MED ORDER — CLONAZEPAM 0.5 MG TABLET
ORAL_TABLET | Freq: Every evening | ORAL | 1 refills | 0 days | Status: CP
Start: 2018-04-09 — End: 2018-06-12

## 2018-04-09 NOTE — Unmapped (Signed)
Patient had to cancel appointment for tomorrow. New appointment with new provider scheduled for 06/12/2018. Will send 2 months refills of klonopin to get to new appointment. Last fill 03/11/2018 per PDMP.

## 2018-04-13 ENCOUNTER — Encounter
Admit: 2018-04-13 | Discharge: 2018-04-14 | Payer: MEDICARE | Attending: Obstetrics & Gynecology | Primary: Obstetrics & Gynecology

## 2018-04-13 DIAGNOSIS — N181 Chronic kidney disease, stage 1: Secondary | ICD-10-CM | POA: Diagnosis not present

## 2018-04-13 DIAGNOSIS — K219 Gastro-esophageal reflux disease without esophagitis: Secondary | ICD-10-CM | POA: Diagnosis not present

## 2018-04-13 DIAGNOSIS — J45909 Unspecified asthma, uncomplicated: Secondary | ICD-10-CM | POA: Diagnosis not present

## 2018-04-13 DIAGNOSIS — G894 Chronic pain syndrome: Secondary | ICD-10-CM | POA: Diagnosis not present

## 2018-04-13 DIAGNOSIS — Z880 Allergy status to penicillin: Secondary | ICD-10-CM | POA: Diagnosis not present

## 2018-04-13 DIAGNOSIS — Z79899 Other long term (current) drug therapy: Secondary | ICD-10-CM | POA: Diagnosis not present

## 2018-04-13 DIAGNOSIS — Z7982 Long term (current) use of aspirin: Secondary | ICD-10-CM | POA: Diagnosis not present

## 2018-04-13 DIAGNOSIS — L299 Pruritus, unspecified: Secondary | ICD-10-CM | POA: Diagnosis not present

## 2018-04-13 DIAGNOSIS — M329 Systemic lupus erythematosus, unspecified: Secondary | ICD-10-CM | POA: Diagnosis not present

## 2018-04-13 DIAGNOSIS — Z7951 Long term (current) use of inhaled steroids: Secondary | ICD-10-CM | POA: Diagnosis not present

## 2018-04-13 DIAGNOSIS — Z9289 Personal history of other medical treatment: Secondary | ICD-10-CM | POA: Diagnosis not present

## 2018-04-13 DIAGNOSIS — H905 Unspecified sensorineural hearing loss: Secondary | ICD-10-CM | POA: Diagnosis not present

## 2018-04-13 DIAGNOSIS — L408 Other psoriasis: Secondary | ICD-10-CM | POA: Diagnosis not present

## 2018-04-13 DIAGNOSIS — M1990 Unspecified osteoarthritis, unspecified site: Secondary | ICD-10-CM | POA: Diagnosis not present

## 2018-04-13 DIAGNOSIS — I129 Hypertensive chronic kidney disease with stage 1 through stage 4 chronic kidney disease, or unspecified chronic kidney disease: Secondary | ICD-10-CM | POA: Diagnosis not present

## 2018-04-13 DIAGNOSIS — N898 Other specified noninflammatory disorders of vagina: Principal | ICD-10-CM
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
## 2018-04-16 NOTE — Unmapped (Signed)
Hi Dr. Nilsa Nutting,  [name] phoned and she was inquiring about lab work that you had discussed with her. She attempted to go to a lab in [place]. Please advise. Thanks, Gunnar Fusi.

## 2018-04-17 ENCOUNTER — Other Ambulatory Visit: Payer: Self-pay

## 2018-04-17 MED ORDER — CLOBETASOL 0.05 % TOPICAL OINTMENT
Freq: Two times a day (BID) | TOPICAL | 1 refills | 0 days | Status: CP
Start: 2018-04-17 — End: 2018-06-11

## 2018-04-17 NOTE — Unmapped (Signed)
Spoke with this patient and she will go to the [facility] clinic in [place]. I explained that they should be able to see these labs and to call me if any issues. Thanks again.

## 2018-04-18 NOTE — Unmapped (Signed)
Patient called to f/u with results of biopsy. C/o of intolerable vaginal itching. Returned call to tell patient biopsy has not resulted yet, but Dr. Lu Duffel has written for a cream for her to try. Patient states she would go to pharmacy today and begin trying the cream.

## 2018-04-19 ENCOUNTER — Telehealth: Payer: Self-pay

## 2018-04-19 MED ORDER — FLUCONAZOLE 150 MG TABLET
ORAL_TABLET | 0 refills | 0 days | Status: CP
Start: 2018-04-19 — End: 2018-09-20

## 2018-04-19 NOTE — Telephone Encounter (Signed)
Copied from [place] [id number]. Topic: Appointment Scheduling - Scheduling Inquiry for Clinic >> Apr 18, 2018  8:58 AM Nicole Ballard, NT wrote: Reason for CRM: patient called in today to request an appointment with Dr. Ancil Boozer for vaginal itching. Informed the patient Dr. Ancil Boozer first available appointment would be tomorrow at 8:20. She said she is going out of town this evening. I offered an appointment with another provider at this office and she declined and states she wants to speak to Dr. Ancil Boozer nurse only. Please contact.

## 2018-04-19 NOTE — Telephone Encounter (Signed)
Patient went to her GYN and they gave her a vaginal cream to see if it helped. If not they informed her to come into her PCP.

## 2018-04-19 NOTE — Unmapped (Signed)
Pt called requesting biopsy results. Message sent to Dr. Lu Duffel to contact patient.

## 2018-04-23 ENCOUNTER — Other Ambulatory Visit: Payer: Self-pay | Admitting: *Deleted

## 2018-04-23 NOTE — Patient Outreach (Addendum)
[place] [facility]) Care Management  04/23/2018  Dobson Aug 14, 1956 [id number]   EMMI campaign/prevent call/engagement Date: 04/17/18 Engagement tool Score: 8  Outreach attempt # 1 Successful at her home number  Patient is able to verify HIPAA but with caution Reviewed and addressed referral to [facility] with patient x 3 Reminding her of call from [place] with RN to follow up By the end of the call Mrs Lefebre was more comfortable in speaking with CM  Questions were answered for her related to dentists and how to get a cost efficient dentist. CM reviewed united health care online site and customer services and calling specialists for pricing   She denied concerns with her ED visit or falls (2 in last 6 months) at this time  Social: Mrs Michaelson is divorced and lives alone with support of her daughter She is independent in all her care She reports she has 2000 car that gets her to appointments when needed.    Conditions: Lupus, fibromyalgia, diabetes, hypertension, hyperlipidemia, GERD, diverticulosis   Medications: denies concerns with taking medications as prescribed, affording medications, side effects of medications and questions about medications. She reports getting medications via mail order without issues. She mentioned changes in BP medications to assist with elevations. She reports her DM is managed  Appointments: last seen primary [place] 03/21/18 and calls to office as needed Reports seeing providers in [place] but is aware of transportation services to [place] if her daughter is not able to assist  Advance Directives: Denies need for assist with or assist with changes for advance directives   Consent: [facility] RN CM reviewed [facility] services with patient.  She denies need of services from [facility] Community/Telephonic RN CM, pharmacy or SW at this time but seems interested possibly in the future for [facility] SW services (transportation to [facility],  "want to move out" )  and will call back when and if needed Cm had to repeat the details of community RN CM services until she voiced understanding.  Plan: [facility] RN CM will close case at this time as patient has been assessed and no needs identified.    Shalese Strahan L. Lavina Hamman, RN, BSN, [place] Management Care Coordinator Direct Number [phone] Mobile number [phone]  Main [facility] number [phone] Fax number [phone]

## 2018-04-24 NOTE — Unmapped (Signed)
Called pt with results of vulvar biopsy and positive yeast. Dr. Lu Duffel prescribed diflucan, pt will pick up.

## 2018-04-25 MED ORDER — HYDROXYCHLOROQUINE 200 MG TABLET
ORAL_TABLET | Freq: Two times a day (BID) | ORAL | 2 refills | 0.00000 days | Status: CP
Start: 2018-04-25 — End: 2018-05-22

## 2018-04-25 NOTE — Unmapped (Signed)
HCQ refill.  Last ov 01/31/18  Next ov 05/22/18

## 2018-04-25 NOTE — Unmapped (Signed)
Patient is requesting a RX for plaquenil to be sent to [facility] [address], [place], [place] (in [place])

## 2018-04-27 ENCOUNTER — Other Ambulatory Visit: Payer: Self-pay | Admitting: Family Medicine

## 2018-04-27 DIAGNOSIS — K219 Gastro-esophageal reflux disease without esophagitis: Secondary | ICD-10-CM

## 2018-04-27 MED ORDER — OMEPRAZOLE 40 MG PO CPDR: 40.0000 mg | DELAYED_RELEASE_CAPSULE | Freq: Every day | ORAL | 0 refills | Status: DC

## 2018-04-27 MED ORDER — AMLODIPINE BESYLATE 2.5 MG PO TABS: 2.5000 mg | ORAL_TABLET | Freq: Every day | ORAL | 0 refills | Status: DC

## 2018-04-27 NOTE — Telephone Encounter (Signed)
Copied from [place] [id number]. Topic: Quick Communication - Rx Refill/Question >> Apr 27, 2018  8:21 AM Scherrie Gerlach wrote: Medication: amLODipine (NORVASC) 2.5 MG tablet  Pt states she is out of her bp med.  Pt states she threw the bottle away, but thinks it started with an "a".  She said [facility] told her it would be 7 days until her med gets there, then told them not to send until she called the office to see if she could get some sent to local pharmacy. (?) Pt says she is going to call them right after she gets off phone with me and tell them to send her med.  Pt states she has been on so many bp meds, she really does not know what she is taking.  So is requesting 15 days to get her through sent to local [place], [place] [phone] (Phone) [phone] (Fax)  Pt also requesting  omeprazole (PRILOSEC) 40 MG capsule to [place], [place] [employer] [phone] (Phone) [phone] (Fax)

## 2018-04-27 NOTE — Telephone Encounter (Signed)
Please review and submit the requested medication.  Each one has to be submitted to different pharmacies  Refill request was sent to Dr. Steele Sizer for approval and submission.

## 2018-04-29 ENCOUNTER — Other Ambulatory Visit: Payer: Self-pay | Admitting: Family Medicine

## 2018-04-29 DIAGNOSIS — E1169 Type 2 diabetes mellitus with other specified complication: Secondary | ICD-10-CM

## 2018-04-29 DIAGNOSIS — E669 Obesity, unspecified: Principal | ICD-10-CM

## 2018-04-30 NOTE — Unmapped (Signed)
[facility] Refill Coordination Note  Medication: mycophenolate (myfortic)    Unable to reach patient to schedule shipment for medication being filled at [facility]. Left voicemail on phone.  As this is the 3rd unsuccessful attempt to reach the patient, no additional phone call attempts will be made at this time.      Phone numbers attempted: [phone]    Last scheduled delivery: 02/06/18 (for 90 days)    Please call the [facility] at [phone] (option 4) should you have any further questions.      Thanks,  [facility] Shared [facility] Specialty Team

## 2018-05-01 ENCOUNTER — Encounter: Admit: 2018-05-01 | Discharge: 2018-05-02 | Payer: MEDICARE

## 2018-05-01 DIAGNOSIS — Z5181 Encounter for therapeutic drug level monitoring: Secondary | ICD-10-CM | POA: Diagnosis not present

## 2018-05-01 LAB — CBC W/ AUTO DIFF
BASOPHILS ABSOLUTE COUNT: 0.1 10*9/L (ref 0.0–0.1)
BASOPHILS RELATIVE PERCENT: 1.8 %
EOSINOPHILS ABSOLUTE COUNT: 0.1 10*9/L (ref 0.0–0.4)
EOSINOPHILS RELATIVE PERCENT: 1.4 %
HEMATOCRIT: 40.1 % (ref 36.0–46.0)
HEMOGLOBIN: 12.4 g/dL (ref 12.0–16.0)
LYMPHOCYTES ABSOLUTE COUNT: 1.2 10*9/L — ABNORMAL LOW (ref 1.5–5.0)
LYMPHOCYTES RELATIVE PERCENT: 21.8 %
MEAN CORPUSCULAR HEMOGLOBIN CONC: 30.8 g/dL — ABNORMAL LOW (ref 31.0–37.0)
MEAN CORPUSCULAR HEMOGLOBIN: 28.8 pg (ref 26.0–34.0)
MEAN CORPUSCULAR VOLUME: 93.3 fL (ref 80.0–100.0)
MEAN PLATELET VOLUME: 9.2 fL (ref 7.0–10.0)
MONOCYTES RELATIVE PERCENT: 5.1 %
NEUTROPHILS ABSOLUTE COUNT: 3.7 10*9/L (ref 2.0–7.5)
NEUTROPHILS RELATIVE PERCENT: 67.4 %
RED BLOOD CELL COUNT: 4.29 10*12/L (ref 4.00–5.20)
RED CELL DISTRIBUTION WIDTH: 14.1 % (ref 12.0–15.0)
WBC ADJUSTED: 5.4 10*9/L (ref 4.5–11.0)

## 2018-05-01 LAB — HEPATIC FUNCTION PANEL
ALBUMIN: 4.1 g/dL (ref 3.5–5.0)
ALKALINE PHOSPHATASE: 73 U/L (ref 38–126)
ALT (SGPT): 28 U/L (ref 15–48)
AST (SGOT): 30 U/L (ref 14–38)
PROTEIN TOTAL: 7.7 g/dL (ref 6.5–8.3)

## 2018-05-01 LAB — NEUTROPHILS ABSOLUTE COUNT: Lab: 3.7

## 2018-05-01 LAB — BILIRUBIN DIRECT: Bilirubin.glucuronidated:MCnc:Pt:Ser/Plas:Qn:: <0.1

## 2018-05-01 NOTE — Unmapped (Addendum)
Patient informed me she had picked up old medication (cellcept 500 mg) from [facility]. She did ask them to not auto-fill her medication in the future since she is filling with [facility]. She has not taken Cellcept, but was confused. We reviewed this has been d/c due to nausea, and she was switched to new version. I advised she should throw away/hide old bottle so she doesn't accidentally take wrong product/both.      [facility] [place] [facility] Refill Coordination Note  Specialty Medication(s): Mycophenolic acid (Myfortic)  Additional Medications shipped: na    Caitlin Smith, DOB: 01-01-1956  Phone: [phone] (home) , Alternate phone contact: N/A  Phone or address changes today?: No  All above HIPAA information was verified with patient.  Shipping Address: [address]  [place] [address]   Insurance changes? No    Completed refill call assessment today to schedule patient's medication shipment from the [facility] [phone]).      Confirmed the medication and dosage are correct and have not changed: Yes, regimen is correct and unchanged.    Confirmed patient started or stopped the following medications in the past month:  No, there are no changes reported at this time.    Are you tolerating your medication?:  Linday reports tolerating the medication.    ADHERENCE    Did you miss any doses in the past 4 weeks? No missed doses reported.    FINANCIAL/SHIPPING    Delivery Scheduled: Yes, Expected medication delivery date: Friday, Aug 30   **ADDENDA 05/31/18 - patient called today to report medication not received, only could find proof of package picked up but not proof of delivery - manager [facility]) authorized resending shipment for $0 to patient.  Patient is aware and expecting package 05/31/18.  Filling for $0 and sending same day courier. TGS.**    The patient will receive a drug information handout for each medication shipped and additional FDA Medication Guides as required. Shariece did not have any additional questions at this time.    Delivery address validated in Epic.    We will follow up with patient monthly for standard refill processing and delivery.      Thank you,  Tawanna Solo Shared [facility] Specialty Pharmacist

## 2018-05-03 ENCOUNTER — Encounter: Admit: 2018-05-03 | Discharge: 2018-05-04 | Payer: MEDICARE | Attending: Otolaryngology | Primary: Otolaryngology

## 2018-05-03 DIAGNOSIS — R05 Cough: Secondary | ICD-10-CM | POA: Diagnosis not present

## 2018-05-03 DIAGNOSIS — R490 Dysphonia: Secondary | ICD-10-CM | POA: Diagnosis not present

## 2018-05-03 MED FILL — MYCOPHENOLATE SODIUM 360 MG TABLET,DELAYED RELEASE: ORAL | 90 days supply | Qty: 180 | Fill #0

## 2018-05-03 MED FILL — MYCOPHENOLATE SODIUM 360 MG TABLET,DELAYED RELEASE: 90 days supply | Qty: 180 | Fill #0 | Status: AC

## 2018-05-03 NOTE — Unmapped (Addendum)
Main clinic: [phone]  Office fax #: [phone]    For appointments call [phone] option 2    Nursing questions contact Dr. Atha Starks nurse, Thermon Leyland at [phone].  For surgical scheduling, call [name] Alar [phone]    Emergency after hours, please call [phone] and ask to speak with the ENT physician on call.        [place], [place], and [place] Referrals  [place], [place]  Richardo Priest. [facility]   [phone]  [email or web address] .Kathreen Devoid, [place]  [facility]  [facility]  [address]  [place], [place]  [phone]    [place], [place]  [facility]  Phone [phone]    [place], [place]   [facility]  (May have to schedule new physician apt. to see their therapists)  [phone]    [place], [place]  [facility]  Phone [phone]   Fax [phone]    [place], [place]   Doristine Johns  [email or web address]  [phone]    Lars Mage, [place]  Edward Qualia  [facility]  Scheduling [phone]  [facility] [phone]  Fax [phone] Ammie Dalton, [facility]  [facility]   Ann Cryptar  [phone] option 2     [place], [place]  [facility]   [email or web address]  Phone [phone]  Fax [phone]    [place], [place]   Winder Huffman/Lauren Brock/Jen Aino Heckert  [facility]  [address]   [place], [place] [address]  [phone]  Fax [phone]    [place], [place] - [facility]  [facility]  [address]  [phone]  [phone]    [place], [place]  Link Snuffer  Phone [phone]   Work Fax [phone]    [place], [place]  Orson Eva  [employer] [employer] [phone]  Fax [phone]    [place], [place]  Amy Morris  Marinus Maw  [facility] [phone]

## 2018-05-03 NOTE — Unmapped (Signed)
Otolaryngology Return Voice Visit        History of Present Illness:  Caitlin Smith is a 62 y.o.  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   ??  At the end of our previous clinic visit we discussed and recommended clinical course of Respiratory Retraining, Biotene mouthwash, D/C Listerine- switch to salt water gargles   ??  July 2017: I recommended NeilMed rinses and a workup of glossitis (electrolyte and vitamin levels per primary care physician: B12, zinc, etc.,) Her diagnoses included right vocal fold hypomobility/depression anxiety. 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  ??  December 2018: The patient notes that her symptoms have been relatively stable.  She continues to complain of burning tongue.  Following this visit I recommended a thyroid hormone panel and thyroid ultrasound.    August 2019: The patient reports that she continues to clear there throat and cough in a consistent manner.  Thyroid ultrasound was unremarkable w/o nodules. She occasionally (rarely ) awakens from sleep with cough. She has done a few sessions with SLP, but claims to have forgotten the techniques  ??    Interval Surgery/Medical History:    Interval  Past Medical History and ROS is otherwise unchanged except as per HPI      Medications:    Current Outpatient Medications:   ???  amLODIPine (NORVASC) 2.5 MG tablet, Take 2.5 mg by mouth daily., Disp: , Rfl:   ???  aspirin (ECOTRIN) 81 MG tablet, Take 81 mg by mouth daily., Disp: , Rfl:   ???  atorvastatin (LIPITOR) 40 MG tablet, Take 40 mg by mouth daily., Disp: , Rfl:   ???  buPROPion (WELLBUTRIN XL) 150 MG 24 hr tablet, Take 1 tablet (150 mg total) by mouth daily. Take with 300 mg tablet for 450 mg total., Disp: 30 tablet, Rfl: 5  ???  clobetasol (TEMOVATE) 0.05 % ointment, Apply topically Two (2) times a day., Disp: 15 g, Rfl: 1  ???  clonazePAM (KLONOPIN) 0.5 MG tablet, Take 0.5 tablets (0.25 mg total) by mouth nightly., Disp: 15 tablet, Rfl: 1  ???  conjugated estrogens (PREMARIN) 0.625 mg/gram vaginal cream, Insert 0.5 g into the vagina Two (2) times a week. (Patient not taking: Reported on 04/13/2018), Disp: 5 g, Rfl: 9  ???  cyclobenzaprine (FLEXERIL) 5 MG tablet, Take 1 tablet (5 mg total) by mouth two (2) times a day as needed., Disp: 60 tablet, Rfl: 2  ???  diclofenac sodium (VOLTAREN) 1 % gel, Apply 4 g topically Four (4) times a day., Disp: 300 g, Rfl: 5  ???  dicyclomine (BENTYL) 20 mg tablet, Take 20 mg by mouth every six (6) hours., Disp: , Rfl:   ???  DULoxetine (CYMBALTA) 60 MG capsule, Take 2 capsules (120 mg total) by mouth daily., Disp: 180 capsule, Rfl: 1  ???  estradiol (VAGIFEM) 10 mcg vaginal tablet, Insert 1 tablet (0.01 mg total) into the vagina Two (2) times a week. (Patient not taking: Reported on 04/13/2018), Disp: 24 tablet, Rfl: 8  ???  fluconazole (DIFLUCAN) 150 MG tablet, 1Q3D x 3days, then once weekly for 4 weeks, Disp: 8 tablet, Rfl: 0  ???  fluticasone (FLOVENT HFA) 110 mcg/actuation inhaler, Inhale 2 puffs Two (2) times a day., Disp: 3 Inhaler, Rfl: 3  ???  gabapentin (NEURONTIN) 300 MG capsule, Take 3 capsules (900 mg total) by mouth Three (3) times  a day., Disp: 810 capsule, Rfl: 1  ???  hydroxychloroquine (PLAQUENIL) 200 mg tablet, Take 1 tablet (200 mg total) by mouth Two (2) times a day., Disp: 180 tablet, Rfl: 2  ???  lamoTRIgine (LAMICTAL) 200 MG tablet, Take 1 tablet (200 mg total) by mouth Two (2) times a day., Disp: 180 tablet, Rfl: 1  ???  losartan-hydrochlorothiazide (HYZAAR) 50-12.5 mg per tablet, Take 0.5 tablets by mouth daily. , Disp: , Rfl:   ???  losartan-hydrochlorothiazide (HYZAAR) 50-12.5 mg per tablet, TAKE ONE-HALF TABLET BY  MOUTH DAILY, Disp: , Rfl:   ???  meclizine (ANTIVERT) 25 mg tablet, Take 25 mg by mouth Three (3) times a day as needed. , Disp: , Rfl:   ???  metFORMIN (GLUCOPHAGE) 500 MG tablet, Take 500 mg by mouth 2 (two) times a day with meals. , Disp: , Rfl:   ???  mycophenolate (MYFORTIC) 360 MG TbEC, TAKE 1 TABLET BY MOUTH TWICE DAILY, Disp: 180 each, Rfl: 3  ???  olopatadine (PATANOL) 0.1 % ophthalmic solution, Administer 1 drop to both eyes Two (2) times a day., Disp: 5 mL, Rfl: 1  ???  omeprazole (PRILOSEC) 40 MG capsule, Take 40 mg by mouth daily. , Disp: , Rfl:   ???  polyethylene glycol (GLYCOLAX) 17 gram/dose powder, 2 cap fulls in a full glass of water, three times a day, for 5 days., Disp: , Rfl:   ???  predniSONE (DELTASONE) 2.5 MG tablet, Take 1 tablet (2.5 mg total) by mouth daily., Disp: 90 tablet, Rfl: 0  ???  PROAIR HFA 90 mcg/actuation inhaler, Inhale 2 puffs every six (6) hours as needed for wheezing. DAW 1, Disp: 3 Inhaler, Rfl: 4  ???  ranitidine (ZANTAC) 150 MG tablet, TAKE 1 TABLET BY MOUTH TWO  TIMES DAILY, Disp: 180 tablet, Rfl: 1  ???  telmisartan-hydrochlorothiazide (MICARDIS HCT) 80-25 mg per tablet, Take 1 tablet by mouth daily., Disp: , Rfl:   ???  topiramate (TOPAMAX) 50 MG tablet, Take 1 tablet (50 mg total) by mouth Two (2) times a day., Disp: 180 tablet, Rfl: 1  ???  traZODone (DESYREL) 100 MG tablet, Take 2 tablets (200 mg total) by mouth nightly., Disp: 180 tablet, Rfl: 1         Pertinent ROS as above and per HPI.     Physical Exam:   Constitutional:  Vitals reviewed on nursing chart.  Voice: Mild reathiness  Respiration:  Breathing comfortably, no stridor.   Ears:  Normal tympanic membranes to otoscopy,   Nose:  External nose midline, anterior rhinoscopy is normal with limited visualization just to the anterior interior turbinate.   Oral Cavity/Oropharynx/Lips:  Normal mucous membranes, normal floor of mouth/tongue/oropharynx, no masses or lesions are noted.    Pharyngeal Walls:  No masses noted.  Neck/Lymph:  No lymphadenopathy, no thyroid masses.      VRQOL: 75  GFI: 8      Assessment:   62 y.o.  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   ??  At the end of our previous clinic visit we discussed and recommended clinical course of Respiratory Retraining, Biotene mouthwash, D/C Listerine- switch to salt water gargles   ??  July 2017: I recommended NeilMed rinses and a workup of glossitis (electrolyte and vitamin levels per primary care physician: B12, zinc, etc.,) Her diagnoses included right vocal fold hypomobility/depression anxiety. I offered to seek information regarding an appropriate referral for  burning mouth syndrome.  ??  December 2018: The patient notes that her symptoms have been relatively stable.  She continues to complain of burning tongue.  Following this visit I recommended a thyroid hormone panel and thyroid ultrasound.    August 2019: The patient reports that she continues to clear there throat and cough in a consistent manner.  Thyroid ultrasound was unremarkable w/o nodules. She occasionally (rarely ) awakens from sleep with cough. She has done a few sessions with SLP, but claims to have forgotten the techniques.  The patient reports she was unable to see the specialist to whom I referred her for burning mouth secondary to insurance coverage issues.  ??  1. Slightly enlarged right Thyroid gland/ no dominant nodule  2.  Burning mouth syndrome     I recommended that she consult her dentist regarding specialty referrals for burning mouth syndrome.  Plan:  1. F/U  6 months    2. I have offered additional behavioral intervention trial for cough    The patient in agreement the plan as articulated above.          This note was created with [employer] and may have errors that were not dictated and not seen in editing.     Elana Alm  Kota Ciancio

## 2018-05-04 NOTE — Unmapped (Signed)
Pap letter sent.

## 2018-05-09 NOTE — Unmapped (Signed)
[facility]   Confidential Psychological Therapy Session      Patient Name: Caitlin Smith  Medical Record Number: [id number]  Date of Service: May 09, 2018  Attending Psychologist: Caroline More, PhD  CPT Procedure Codes: 81191 for 45 mins of face to face counseling    REFERRING PHYSICIAN: Clarene Essex, MD    CHIEF COMPLAINT AND REASON FOR VISIT: pain coping skills, CBT to address depression and anxiety in the setting of chronic pain    SUBJECTIVE / HISTORY OF PRESENT ILLNESS: Ms.  Caitlin Smith is a very pleasant 62 y.o.  female from [place], [place] with multiple chronic pain complaints related to fibromyalgia and lupus who initially met with me in October 2016, and which time she was diagnosed with severe depression, PTSD, panic disorder, and generalized anxiety.The patient returns for a therapy session today. Last follow up with me was 03/02/2018. Pt meeting with Dr. Doylene Smith also today to specifically review medication for polypharmacy and possible sedating medications.     Patient in good spirits today with bright full ranging affect.  Notes that mood and anxiety are much better managed.  States she has been focusing on herself, positive self-care, and working on behavioral weight loss, which we have discussed in her various recent appointments.  She states you really help me.  She is down to 211 pounds, down from 250 pounds.  She feels improved energy, is sleeping better, and overall has better self-esteem.  Dates her dietary habits have changed completely.  Is working on portion control and finds it easy to maintain.  Still was on steroids hoping to come off next month, so was especially commended for losing weight in the setting of this.  Today we continue to focus on behavioral weight loss and self-esteem.  Noted that she does not need to her deprived herself, discussed small ways that she can connect with things she enjoys.  For example she has cut out chocolate completely.  Discussed potentially small portions of dark chocolate with low sugar content.  She has been eating Smith fruits and vegetables.  Notes her taste buds have changed tremendously.  Believes that the positive impact of her weight on self-esteem has help with mood and stress reduction.  Also met with Dr. Doylene Smith today.  Was given recommendations to come off of several medications.  She feels very positive about this.  Still she is meeting with neurology soon to discuss memory loss.  Discusses how she sometimes forgets she has her glasses on and is looking for them.  Sometimes has her keys in her pocket and is looking for them.  Sometimes forgets where she parks her car.  I will solve strategies to not lose these items.  Also discussed that if her attention and concentration, lower level cognitive functions, are not optimally working because of multifactorial reasons including depression, chronic pain signaling, polypharmacy, but this can impact higher level cognitive function like memory.  As we worked to improve depression, anxiety, chronic pain, and polypharmacy, we may see memory improve on its own.    OBJECTIVE / MENTAL STATUS:    Appearance:   Appears stated age and Clean/Neat   Motor:  No abnormal movements   Speech/Language:   Normal rate, volume, tone, fluency   Mood:  Depressed and Anxious   Affect:  Bright, full   Thought process:  Logical, linear, clear, coherent, goal directed   Thought content:    Denies SI, HI, self harm, delusions, obsessions, paranoid ideation, or  ideas of reference   Perceptual disturbances:    Denies auditory and visual hallucinations, behavior not concerning for response to internal stimuli   Orientation:  Oriented to person, place, time, and general circumstances   Attention:  Able to fully attend without fluctuations in consciousness   Concentration:  Able to fully concentrate and attend   Memory:  Immediate, short-term, long-term, and recall grossly intact    Fund of knowledge:   Consistent with level of education and development   Insight:    Fair   Judgment:   Intact   Impulse Control:  Intact     DIAGNOSTIC IMPRESSION:   Post Traumatic Stress Disorder (PTSD)  Generalized Anxiety Disorder (GAD)  Panic Disorder  Major Depressive Disorder, moderate, recurrent  Chronic pain syndrome  Fibromyalgia  Lupus    ASSESSMENT:   Ms.  Caitlin Smith is a very pleasant 62 y.o.  female from [place], [place] with multiple chronic pain complaints related to lupus, arthritis, and fibromyalgia. She was previously seen in our clinic from 2012-2014, and reestablished care with Dr. Fayrene Smith in August 2016. The patient is struggling with depression and anxiety, but is very motivated to participate in intensive, multidisciplinary treatment to address the connection between depression, anxiety and pain. She has a long-standing history of depression and anxiety, and has worked with outpatient psychiatrist and therapist over the years. She is currently established with [facility] psychiatry, and is tapering off of Klonopin.  Depression, anxiety, and panic have all improved.  Has been diagnosed with diabetes, and is managing it well with p.o. medication and dietary changes.  Has lost 25 pounds.  Has increased energy, improved sleep, decreased caffeine intake.      PLAN:   (1) Psychotherapy - Continue CBT. CBT will be used to address PTSD, MDD, Panic D/o, GAD, and chronic pain.   -Follow up 07/09/2018, same day as Dr. Doylene Smith    --Behavioral Activation - Pt has noticed worsening mood/depression. Encouraged behavioral activation. Walking 10-15 mins per day to start.  --Download and use Insight Timer, guided relaxation app, for nightly practice.  --Use food log, provided last appt  --Aggressive bowel regimen (recommended by GI Medicine, used problem solving to add to strategies today).    (2) Psychiatry - Pt currently established with [facility].  Working to taper Klonopin    (3) Nutritionist - saw a nutritionist and found it helpful. Has lost weight, related to nausea. Exercising Smith too.    (4) Safety - Pt denies any current or recent SI or safety concerns. Knows to call 911 or go to her local ED. Also given [facility].      (5) Follow-up with Dr. Doylene Smith same day as meet in 2 months.  Is working on reducing number of medications she is on with concerns about polypharmacy.
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
## 2018-05-11 ENCOUNTER — Encounter: Admit: 2018-05-11 | Discharge: 2018-05-11 | Payer: MEDICARE | Attending: Psychologist | Primary: Psychologist

## 2018-05-11 ENCOUNTER — Encounter: Admit: 2018-05-11 | Discharge: 2018-05-11 | Payer: MEDICARE

## 2018-05-11 DIAGNOSIS — Z7952 Long term (current) use of systemic steroids: Secondary | ICD-10-CM | POA: Diagnosis not present

## 2018-05-11 DIAGNOSIS — G894 Chronic pain syndrome: Secondary | ICD-10-CM | POA: Diagnosis not present

## 2018-05-11 DIAGNOSIS — G8929 Other chronic pain: Secondary | ICD-10-CM | POA: Diagnosis not present

## 2018-05-11 DIAGNOSIS — M542 Cervicalgia: Secondary | ICD-10-CM | POA: Diagnosis not present

## 2018-05-11 DIAGNOSIS — M545 Low back pain: Secondary | ICD-10-CM | POA: Diagnosis not present

## 2018-05-11 DIAGNOSIS — M150 Primary generalized (osteo)arthritis: Secondary | ICD-10-CM | POA: Diagnosis not present

## 2018-05-11 DIAGNOSIS — M797 Fibromyalgia: Secondary | ICD-10-CM | POA: Diagnosis not present

## 2018-05-11 DIAGNOSIS — M48061 Spinal stenosis, lumbar region without neurogenic claudication: Secondary | ICD-10-CM | POA: Diagnosis not present

## 2018-05-11 DIAGNOSIS — F4310 Post-traumatic stress disorder, unspecified: Secondary | ICD-10-CM

## 2018-05-11 DIAGNOSIS — F331 Major depressive disorder, recurrent, moderate: Secondary | ICD-10-CM

## 2018-05-11 DIAGNOSIS — F410 Panic disorder [episodic paroxysmal anxiety] without agoraphobia: Secondary | ICD-10-CM

## 2018-05-11 DIAGNOSIS — F411 Generalized anxiety disorder: Principal | ICD-10-CM

## 2018-05-11 NOTE — Unmapped (Signed)
It was nice to meet you.  Today we did the following:    1. STOP topiramate (Topamax).    2. CHANGE gabapentin to 300 mg nightly.  Please take this medication nightly.  It works best when taken regularly.    3. Continue cyclobenzaprine (Flexeril) and Voltaren Gel as needed.    4. Continue pain psychology.    5. Continue activity as able.    6. Please obtain a pill box to help you keep track of your medications.    7. Follow up in 1-2 months.  Please bring your all your medications with you to your next appointment.

## 2018-05-14 ENCOUNTER — Ambulatory Visit (INDEPENDENT_AMBULATORY_CARE_PROVIDER_SITE_OTHER): Payer: Medicare Other | Admitting: Family Medicine

## 2018-05-14 ENCOUNTER — Encounter: Payer: Self-pay | Admitting: Family Medicine

## 2018-05-14 VITALS — BP 120/76 | HR 87 | Temp 98.7°F | Resp 16 | Ht 68.0 in | Wt 210.5 lb

## 2018-05-14 DIAGNOSIS — E785 Hyperlipidemia, unspecified: Secondary | ICD-10-CM | POA: Diagnosis not present

## 2018-05-14 DIAGNOSIS — E1169 Type 2 diabetes mellitus with other specified complication: Secondary | ICD-10-CM

## 2018-05-14 DIAGNOSIS — R634 Abnormal weight loss: Secondary | ICD-10-CM

## 2018-05-14 DIAGNOSIS — I10 Essential (primary) hypertension: Secondary | ICD-10-CM | POA: Diagnosis not present

## 2018-05-14 DIAGNOSIS — M3219 Other organ or system involvement in systemic lupus erythematosus: Secondary | ICD-10-CM

## 2018-05-14 DIAGNOSIS — R198 Other specified symptoms and signs involving the digestive system and abdomen: Secondary | ICD-10-CM

## 2018-05-14 DIAGNOSIS — Z23 Encounter for immunization: Secondary | ICD-10-CM

## 2018-05-14 DIAGNOSIS — R1033 Periumbilical pain: Secondary | ICD-10-CM

## 2018-05-14 DIAGNOSIS — E669 Obesity, unspecified: Secondary | ICD-10-CM

## 2018-05-14 MED ORDER — MUPIROCIN 2 % EX OINT: 1.0000 "application " | TOPICAL_OINTMENT | Freq: Two times a day (BID) | CUTANEOUS | 0 refills | Status: DC

## 2018-05-14 MED ORDER — AMLODIPINE BESYLATE 2.5 MG PO TABS: 2.5000 mg | ORAL_TABLET | Freq: Every day | ORAL | 0 refills | Status: DC

## 2018-05-14 MED ORDER — TELMISARTAN-HCTZ 80-25 MG PO TABS: 1.0000 | ORAL_TABLET | Freq: Every day | ORAL | 1 refills | Status: DC

## 2018-05-14 MED ORDER — METFORMIN HCL ER 500 MG PO TB24: ORAL_TABLET | ORAL | 1 refills | Status: DC

## 2018-05-14 NOTE — Progress Notes (Signed)
Name: Nicole Ballard   MRN: [id number]    DOB: [birth year]   Date:05/14/2018       Progress Note  Subjective  Chief Complaint  Chief Complaint  Patient presents with  . Navel Pain    Onset-1 week, pain radiates to her middle lower back    HPI  Diabetes type II: diagnosed 07/07/2017 by a hgbA1C 6.5, she has already been taking Metformin for pre-diabetes/metabolic syndrome. She has changed her diet since March 2019 and has lost 40 lbs since. She feels good, states SOB has decreased and is happy with results. She denies polyphagia or polyuria, mouth is always dry secondary to medication. Up to date with urine micro and eye exam done at [facility]. Recheck hgbA1C today.  Umbilical pain: she noticed pain on umbilical area, she states initially was red, she states when she touches the area it feels wet and has an odor, redness has improved, she states she has a rash on the back of her back ( explained unrelated) . No fever or chills. No change in bowel movements. No straining or blood in stools   HTN: Taking Micardis Hctz and Norvasc, and is doing well, bp is at goal, no chest pain or palpitation. She has a history of vertigo and has noticed some dizziness lately, but not lightheaded.   Chronic nausea: sheis now on Cellcept , and nausea has significantly improved   Organizing pneumonia: seeing pulmonologist at [facility], she lost a lot of weight and is on low dose of prednisone and is breathing better, SOB has decreased very seldom has a cough.   SLE: going to [facility], still on prednisone, down to 2.5  mg daily, she has daily joint pain, no effusion, also has fatigue. Doing better overall.   Chronic Depression: going to [facility] and seeing therapist also, she states she was doing well, but over the past 2 weeks has noticed increase in anxiety, depression . She is not sure why, but has follow up with psychiatrist coming up   Pituitary adenoma: stable, denies diplopia. No nipple discharge. Unchanged  Obesity:  she lost 40 lbs since last visit, she is on Metformin and changed her diet, avoid all white carbs. She is also only taking gabapentin qhs instead of three times a day. We will check TSH   Patient Active Problem List   Diagnosis Date Noted  . Hyperglycemia 10/23/2017  . Vertigo 09/29/2016  . Bronchiolitis obliterans organizing pneumonia ([place]) 09/29/2016  . Neck pain, musculoskeletal 12/15/2015  . Anxiety about health 09/29/2015  . Abnormal CAT scan 04/11/2015  . Auditory impairment 04/11/2015  . Insomnia, persistent 04/11/2015  . Chronic kidney disease (CKD), stage I 04/11/2015  . Chronic nonmalignant pain 04/11/2015  . Chronic constipation 04/11/2015  . Dyslipidemia 04/11/2015  . Fibromyalgia 04/11/2015  . Gastro-esophageal reflux disease without esophagitis 04/11/2015  . Dysphonia 04/11/2015  . Absence of bladder continence 04/11/2015  . Low back pain 04/11/2015  . Eczema intertrigo 04/11/2015  . Keratosis pilaris 04/11/2015  . Chronic recurrent major depressive disorder ([place]) 04/11/2015  . Dysmetabolic syndrome 96/[id number]/9381  . Neurogenic claudication 04/11/2015  . Perennial allergic rhinitis with seasonal variation 04/11/2015  . Abnormal presence of protein in urine 04/11/2015  . Seborrhea capitis 04/11/2015  . Moderate dysplasia of vulva 04/11/2015  . Vitamin D deficiency 04/11/2015  . Treadmill stress test negative for angina pectoris 03/05/2015  . Shortness of breath 05/20/2014  . Morbid obesity ([place]) 02/03/2014  . Asthma, chronic 12/31/2013  . H/O total knee replacement  12/31/2013  . Episcleritis 09/26/2013  . Vocal cord dysfunction 05/28/2013  . Anxiety, generalized 03/19/2013  . Back pain 12/18/2012  . Pulmonary nodules 11/26/2012  . Lung nodule, multiple 11/26/2012  . Arthritis, degenerative 11/01/2012  . H/O aspiration pneumonitis 10/22/2012  . Postinflammatory pulmonary fibrosis ([place]) 10/22/2012  . Mixed incontinence 05/04/2012  . Benign neoplasm of pituitary  gland and craniopharyngeal duct ([place]) 12/29/2010  . Lung involvement in systemic lupus erythematosus ([place]) 11/05/2010  . Chronic nausea 07/01/2008  . Benign hypertension 01/25/2005    Past Surgical History:  Procedure Laterality Date  . BRAIN SURGERY    . CHOLECYSTECTOMY    . VAGINAL HYSTERECTOMY      Family History  Problem Relation Age of Onset  . Breast cancer Mother   . Cancer Mother 54       Breast  . Cancer Father 64       Stomach  . Breast cancer Maternal Aunt     Social History   Socioeconomic History  . Marital status: Divorced    Spouse name: Not on file  . Number of children: Not on file  . Years of education: Not on file  . Highest education level: Not on file  Occupational History  . Not on file  Social Needs  . Financial resource strain: Not on file  . Food insecurity:    Worry: Not on file    Inability: Not on file  . Transportation needs:    Medical: Not on file    Non-medical: Not on file  Tobacco Use  . Smoking status: Never Smoker  . Smokeless tobacco: Never Used  Substance and Sexual Activity  . Alcohol use: No    Alcohol/week: 0.0 standard drinks  . Drug use: No  . Sexual activity: Not Currently  Lifestyle  . Physical activity:    Days per week: Not on file    Minutes per session: Not on file  . Stress: Not on file  Relationships  . Social connections:    Talks on phone: Not on file    Gets together: Not on file    Attends religious service: Not on file    Active member of club or organization: Not on file    Attends meetings of clubs or organizations: Not on file    Relationship status: Not on file  . Intimate partner violence:    Fear of current or ex partner: Not on file    Emotionally abused: Not on file    Physically abused: Not on file    Forced sexual activity: Not on file  Other Topics Concern  . Not on file  Social History Narrative  . Not on file     Current Outpatient Medications:  .  albuterol (PROAIR HFA) 108  (90 BASE) MCG/ACT inhaler, Inhale 2 puffs into the lungs every 6 (six) hours as needed for wheezing. DX code R06.02, Disp: 3 Inhaler, Rfl: 3 .  amLODipine (NORVASC) 2.5 MG tablet, Take 1 tablet (2.5 mg total) by mouth daily., Disp: 90 tablet, Rfl: 0 .  aspirin EC 81 MG tablet, Take 1 tablet (81 mg total) by mouth daily., Disp: 30 tablet, Rfl: 0 .  atorvastatin (LIPITOR) 40 MG tablet, TAKE 1 TABLET BY MOUTH  DAILY, Disp: 90 tablet, Rfl: 1 .  azelastine (OPTIVAR) 0.05 % ophthalmic solution, Place 1 drop into both eyes 2 (two) times daily., Disp: 6 mL, Rfl: 1 .  bisacodyl (DULCOLAX) 10 MG suppository, Place rectally., Disp: , Rfl:  .  buPROPion (WELLBUTRIN XL) 300 MG 24 hr tablet, Take 1 tablet by mouth daily., Disp: , Rfl:  .  Cholecalciferol (VITAMIN D) 2000 UNITS CAPS, Take 1 capsule by mouth daily., Disp: , Rfl:  .  clindamycin (CLEOCIN) 300 MG capsule, TK 2 CS PO WITHIN 1 HOUR OF ANY DENTAL PROCEDURE, Disp: , Rfl:  .  clobetasol ointment (TEMOVATE) 0.05 %, Apply topically., Disp: , Rfl:  .  clonazePAM (KLONOPIN) 0.5 MG tablet, Take 0.5 tablets (0.25 mg total) by mouth daily., Disp: 15 tablet, Rfl: 0 .  conjugated estrogens (PREMARIN) vaginal cream, Place vaginally., Disp: , Rfl:  .  cyclobenzaprine (FLEXERIL) 5 MG tablet, TAKE 1 TABLET BY MOUTH  EVERY 8 HOURS AS NEEDED FOR MUSCLE SPASMS. DO NOT DRIVE FOR 8 HOURS, Disp: 90 tablet, Rfl: 1 .  diclofenac sodium (VOLTAREN) 1 % GEL, APP 2 GRAMS EXT AA QID, Disp: , Rfl: 6 .  dicyclomine (BENTYL) 20 MG tablet, Take 1 tablet (20 mg total) by mouth every 6 (six) hours as needed (Abdominal Cramping/Pain)., Disp: 15 tablet, Rfl: 0 .  DULoxetine (CYMBALTA) 60 MG capsule, Take 120 mg by mouth at bedtime. , Disp: , Rfl:  .  FLOVENT HFA 110 MCG/ACT inhaler, INHALE 2 PUFFS PO BID, Disp: , Rfl: 0 .  fluticasone (FLONASE) 50 MCG/ACT nasal spray, USE 2 SPRAYS IN EACH  NOSTRIL DAILY, Disp: 48 g, Rfl: 1 .  gabapentin (NEURONTIN) 300 MG capsule, Take 1 capsule by mouth  at bedtime., Disp: , Rfl:  .  hydroxychloroquine (PLAQUENIL) 200 MG tablet, Take 200 mg by mouth 2 (two) times daily., Disp: , Rfl:  .  lamoTRIgine (LAMICTAL) 200 MG tablet, Take 1 tablet (200 mg total) by mouth 2 (two) times daily., Disp: 60 tablet, Rfl: 0 .  meclizine (ANTIVERT) 25 MG tablet, TAKE 1 TABLET BY MOUTH 3  TIMES DAILY AS NEEDED, Disp: 30 tablet, Rfl: 0 .  metFORMIN (GLUCOPHAGE-XR) 500 MG 24 hr tablet, TAKE 1 TABLET(500 MG) BY MOUTH DAILY WITH BREAKFAST, Disp: 90 tablet, Rfl: 1 .  Multiple Vitamin (MULTIVITAMIN) tablet, Take 1 tablet by mouth daily., Disp: , Rfl:  .  mycophenolate (CELLCEPT) 500 MG tablet, Take 2 tablets (1,000 mg total) by mouth 2 (two) times daily., Disp: 5 tablet, Rfl: 0 .  mycophenolate (MYFORTIC) 360 MG TBEC EC tablet, Take by mouth., Disp: , Rfl:  .  olopatadine (PATANOL) 0.1 % ophthalmic solution, Place 1 drop into both eyes 2 (two) times daily., Disp: , Rfl:  .  omeprazole (PRILOSEC) 40 MG capsule, Take 1 capsule (40 mg total) by mouth daily., Disp: 90 capsule, Rfl: 0 .  ondansetron (ZOFRAN) 4 MG tablet, TAKE 1 TABLET BY MOUTH  EVERY 8 HOURS AS NEEDED FOR NAUSEA AND VOMITING, Disp: 60 tablet, Rfl: 0 .  prednisoLONE acetate (PRED FORTE) 1 % ophthalmic suspension, Place 1 drop into both eyes 2 (two) times daily as needed., Disp: , Rfl: 0 .  predniSONE (DELTASONE) 2.5 MG tablet, Take 7.5 mg by mouth daily., Disp: , Rfl: 0 .  promethazine (PHENERGAN) 25 MG tablet, , Disp: , Rfl:  .  ranitidine (ZANTAC) 150 MG tablet, Take 1 tablet by mouth 2 (two) times daily., Disp: , Rfl:  .  telmisartan-hydrochlorothiazide (MICARDIS HCT) 80-25 MG tablet, Take 1 tablet by mouth daily., Disp: 90 tablet, Rfl: 1 .  traZODone (DESYREL) 100 MG tablet, Take 1 tablet by mouth at bedtime., Disp: , Rfl:  .  triamcinolone ointment (KENALOG) 0.1 %, , Disp: , Rfl:  .  YUVAFEM 10 MCG TABS vaginal tablet, Place 1 tablet vaginally once a week., Disp: , Rfl:  .  docusate sodium (COLACE) 100 MG  capsule, Take by mouth., Disp: , Rfl:  .  mupirocin ointment (BACTROBAN) 2 %, Place 1 application into the nose 2 (two) times daily., Disp: 22 g, Rfl: 0  Allergies  Allergen Reactions  . Ace Inhibitors     Other reaction(s): OTHER Pt states she can not take ace inhibitors. Pt states she can not remember her reaction.   Marland Kitchen Penicillins      ROS  Constitutional: Negative for fever , positive for  weight change.  Respiratory: Positive for cough and shortness of breath.   Cardiovascular: Negative for chest pain or palpitations.  Gastrointestinal: Negative for abdominal pain, no bowel changes.  Musculoskeletal: Negative for gait problem or joint swelling.  Skin: Negative for rash.  Neurological: Positive  for intermittent dizziness but no headache.  No other specific complaints in a complete review of systems (except as listed in HPI above).   Objective  Vitals:   05/14/18 1445  BP: 120/76  Pulse: 87  Resp: 16  Temp: 98.7 F (37.1 C)  TempSrc: Oral  SpO2: 96%  Weight: 210 lb 8 oz (95.5 kg)  Height: _0  (1.727 m)    Body mass index is 32.01 kg/m.  Physical Exam  Constitutional: Patient appears well-developed and well-nourished. Obese  No distress.  HEENT: head atraumatic, normocephalic, pupils equal and reactive to light, , neck supple, throat within normal limits Cardiovascular: Normal rate, regular rhythm and normal heart sounds.  No murmur heard. No BLE edema. Pulmonary/Chest: Effort normal and breath sounds normal. No respiratory distress. Abdominal: Soft.  There is no tenderness. She has erythema on umbilical area and some oozing, no significant pain no bleeding, culture collected Psychiatric: Patient has a normal mood and affect. behavior is normal. Judgment and thought content normal.  Recent Results (from the past 2160 hour(s))  Lipase, blood     Status: None   Collection Time: 02/28/18  4:48 PM  Result Value Ref Range   Lipase 25 11 - 51 U/L    Comment:  Performed at [facility], [address] [place], [place] [address]  Comprehensive metabolic panel     Status: Abnormal   Collection Time: 02/28/18  4:48 PM  Result Value Ref Range   Sodium 137 135 - 145 mmol/L   Potassium 3.9 3.5 - 5.1 mmol/L   Chloride 100 98 - 111 mmol/L    Comment: Please note change in reference range.   CO2 25 22 - 32 mmol/L   Glucose, Bld 80 70 - 99 mg/dL    Comment: Please note change in reference range.   BUN 13 6 - 20 mg/dL    Comment: Please note change in reference range.   Creatinine, Ser 0.84 0.44 - 1.00 mg/dL   Calcium 9.3 8.9 - 10.3 mg/dL   Total Protein 8.4 (H) 6.5 - 8.1 g/dL   Albumin 4.2 3.5 - 5.0 g/dL   AST 28 15 - 41 U/L   ALT 19 0 - 44 U/L    Comment: Please note change in reference range.   Alkaline Phosphatase 65 38 - 126 U/L   Total Bilirubin 0.6 0.3 - 1.2 mg/dL   GFR calc non Af Amer >60 >60 mL/min   GFR calc Af Amer >60 >60 mL/min    Comment: (NOTE) The eGFR has been calculated using the CKD EPI equation. This calculation has not  been validated in all clinical situations. eGFR's persistently <60 mL/min signify possible Chronic Kidney Disease.    Anion gap 12 5 - 15    Comment: Performed at [facility], [address] [place], [place] [address]  CBC     Status: Abnormal   Collection Time: 02/28/18  4:48 PM  Result Value Ref Range   WBC 5.3 3.6 - 11.0 K/uL   RBC 4.67 3.80 - 5.20 MIL/uL   Hemoglobin 13.6 12.0 - 16.0 g/dL   HCT 40.4 35.0 - 47.0 %   MCV 86.7 80.0 - 100.0 fL   MCH 29.2 26.0 - 34.0 pg   MCHC 33.7 32.0 - 36.0 g/dL   RDW 14.8 (H) 11.5 - 14.5 %   Platelets 325 150 - 440 K/uL    Comment: Performed at [facility], [address] [place], [place] [address]  Urinalysis, Complete w Microscopic     Status: Abnormal   Collection Time: 02/28/18  4:48 PM  Result Value Ref Range   Color, Urine AMBER (A) YELLOW    Comment: BIOCHEMICALS MAY BE AFFECTED BY COLOR   APPearance CLEAR (A) CLEAR    Specific Gravity, Urine 1.030 1.005 - 1.030   pH 5.0 5.0 - 8.0   Glucose, UA NEGATIVE NEGATIVE mg/dL   Hgb urine dipstick NEGATIVE NEGATIVE   Bilirubin Urine SMALL (A) NEGATIVE   Ketones, ur 5 (A) NEGATIVE mg/dL   Protein, ur 30 (A) NEGATIVE mg/dL   Nitrite NEGATIVE NEGATIVE   Leukocytes, UA TRACE (A) NEGATIVE   RBC / HPF 0-5 0 - 5 RBC/hpf   WBC, UA 0-5 0 - 5 WBC/hpf   Bacteria, UA NONE SEEN NONE SEEN   Squamous Epithelial / LPF 0-5 0 - 5   Mucus PRESENT     Comment: Performed at [facility], [address] [place], [place] [address]  HM PAP SMEAR     Status: None   Collection Time: 03/29/18 12:00 AM  Result Value Ref Range   HM Pap smear Normal Pap, Negative HPV,     Comment: [facility] Dr. Tressie Ellis    Diabetic Foot Exam: Diabetic Foot Exam - Simple   Simple Foot Form Diabetic Foot exam was performed with the following findings:  Yes 05/14/2018  3:56 PM  Visual Inspection No deformities, no ulcerations, no other skin breakdown bilaterally:  Yes Sensation Testing See comments:  Yes Pulse Check Posterior Tibialis and Dorsalis pulse intact bilaterally:  Yes Comments      PHQ2/9: Depression screen [facility] 2/9 05/14/2018 04/23/2018 11/21/2017 04/28/2017 10/31/2016  Decreased Interest 1 0 0 0 0  Down, Depressed, Hopeless 2 0 0 1 0  PHQ - 2 Score 3 0 0 1 0  Altered sleeping 2 - - - -  Tired, decreased energy 2 - - - -  Change in appetite 0 - - - -  Feeling bad or failure about yourself  1 - - - -  Trouble concentrating 1 - - - -  Moving slowly or fidgety/restless 0 - - - -  Suicidal thoughts 0 - - - -  PHQ-9 Score 9 - - - -  Difficult doing work/chores Somewhat difficult - - - -    Fall Risk: Fall Risk  05/14/2018 11/21/2017 07/07/2017 04/28/2017 10/31/2016  Falls in the past year? Yes No No No Yes  Number falls in past yr: 2 or more - - - 1  Comment Dizziness - - - -  Injury with Fall? No - - -  No     Functional Status Survey: Is the patient deaf or have difficulty  hearing?: No Does the patient have difficulty seeing, even when wearing glasses/contacts?: Yes(glasses) Does the patient have difficulty concentrating, remembering, or making decisions?: No Does the patient have difficulty walking or climbing stairs?: No Does the patient have difficulty dressing or bathing?: No Does the patient have difficulty doing errands alone such as visiting a doctor's office or shopping?: No    Assessment & Plan  1. Diabetes mellitus type 2 in obese (HCC)  - hemoglobin (Hb A1C) - metFORMIN (GLUCOPHAGE-XR) 500 MG 24 hr tablet; TAKE 1 TABLET(500 MG) BY MOUTH DAILY WITH BREAKFAST  Dispense: 90 tablet; Refill: 1  2. Need for immunization against influenza  - Flu Vaccine QUAD 6+ mos PF IM (Fluarix Quad PF)  3. Benign hypertension  - amLODipine (NORVASC) 2.5 MG tablet; Take 1 tablet (2.5 mg total) by mouth daily.  Dispense: 90 tablet; Refill: 0 - telmisartan-hydrochlorothiazide (MICARDIS HCT) 80-25 MG tablet; Take 1 tablet by mouth daily.  Dispense: 90 tablet; Refill: 1  4. Morbid obesity (HCC)  Lost 40 lbs in the past 7 months by dietary modification   5. Dyslipidemia   6. Systemic lupus erythematosus with other organ involvement, unspecified SLE type ([place])  Keep follow up at [facility]  7. Umbilical pain  - mupirocin ointment (BACTROBAN) 2 %; Place 1 application into the nose 2 (two) times daily.  Dispense: 22 g; Refill: 0  8. Weight loss  - TSH  9. Umbilical discharge  - Anaerobic and Aerobic Culture

## 2018-05-14 NOTE — Addendum Note (Signed)
Addended by: Inda Coke on: 05/14/2018 04:30 PM   Modules accepted: Orders

## 2018-05-15 NOTE — Unmapped (Signed)
I was the supervising physician in the delivery of the service.

## 2018-05-17 LAB — AEROBIC CULTURE
MICRO NUMBER:: 91079713
SPECIMEN QUALITY:: ADEQUATE

## 2018-05-17 LAB — HEMOGLOBIN A1C W/OUT EAG: HEMOGLOBIN A1C: 5.4 %{Hb} (ref <5.7)

## 2018-05-17 LAB — TSH: TSH: 0.94 mIU/L (ref 0.40–4.50)

## 2018-05-22 ENCOUNTER — Encounter: Admit: 2018-05-22 | Discharge: 2018-05-23 | Payer: MEDICARE

## 2018-05-22 ENCOUNTER — Ambulatory Visit: Admit: 2018-05-22 | Discharge: 2018-05-23 | Payer: MEDICARE | Attending: Neurology | Primary: Neurology

## 2018-05-22 DIAGNOSIS — L659 Nonscarring hair loss, unspecified: Secondary | ICD-10-CM | POA: Diagnosis not present

## 2018-05-22 DIAGNOSIS — R21 Rash and other nonspecific skin eruption: Secondary | ICD-10-CM | POA: Diagnosis not present

## 2018-05-22 DIAGNOSIS — M3219 Other organ or system involvement in systemic lupus erythematosus: Secondary | ICD-10-CM | POA: Diagnosis not present

## 2018-05-22 DIAGNOSIS — Z7982 Long term (current) use of aspirin: Secondary | ICD-10-CM | POA: Diagnosis not present

## 2018-05-22 DIAGNOSIS — I129 Hypertensive chronic kidney disease with stage 1 through stage 4 chronic kidney disease, or unspecified chronic kidney disease: Secondary | ICD-10-CM | POA: Diagnosis not present

## 2018-05-22 DIAGNOSIS — Z7951 Long term (current) use of inhaled steroids: Secondary | ICD-10-CM | POA: Diagnosis not present

## 2018-05-22 DIAGNOSIS — E1122 Type 2 diabetes mellitus with diabetic chronic kidney disease: Secondary | ICD-10-CM | POA: Diagnosis not present

## 2018-05-22 DIAGNOSIS — G3184 Mild cognitive impairment, so stated: Secondary | ICD-10-CM | POA: Diagnosis not present

## 2018-05-22 DIAGNOSIS — Z79899 Other long term (current) drug therapy: Secondary | ICD-10-CM | POA: Diagnosis not present

## 2018-05-22 DIAGNOSIS — N181 Chronic kidney disease, stage 1: Secondary | ICD-10-CM | POA: Diagnosis not present

## 2018-05-22 DIAGNOSIS — M797 Fibromyalgia: Secondary | ICD-10-CM | POA: Diagnosis not present

## 2018-05-22 DIAGNOSIS — G4730 Sleep apnea, unspecified: Secondary | ICD-10-CM | POA: Diagnosis not present

## 2018-05-22 DIAGNOSIS — R413 Other amnesia: Principal | ICD-10-CM

## 2018-05-22 LAB — URINALYSIS WITH CULTURE REFLEX
BACTERIA: NONE SEEN /HPF
BLOOD UA: NEGATIVE
GLUCOSE UA: NEGATIVE
KETONES UA: NEGATIVE
PH UA: 5.5 (ref 5.0–9.0)
RBC UA: 1 /HPF (ref <=4)
SPECIFIC GRAVITY UA: 1.021 (ref 1.003–1.030)
SQUAMOUS EPITHELIAL: <1 /HPF (ref 0–5)
UROBILINOGEN UA: 0.2
WBC UA: 1 /HPF (ref 0–5)

## 2018-05-22 LAB — COMPREHENSIVE METABOLIC PANEL
ALBUMIN: 4 g/dL (ref 3.5–5.0)
ALKALINE PHOSPHATASE: 74 U/L (ref 38–126)
ALT (SGPT): 28 U/L (ref 13–69)
ANION GAP: 5 mmol/L — ABNORMAL LOW (ref 9–15)
AST (SGOT): 29 U/L (ref 17–47)
BILIRUBIN TOTAL: 0.4 mg/dL (ref 0.0–1.2)
BLOOD UREA NITROGEN: 14 mg/dL (ref 7–21)
BUN / CREAT RATIO: 18
CALCIUM: 9.3 mg/dL (ref 8.5–10.2)
CHLORIDE: 100 mmol/L (ref 98–107)
CO2: 33 mmol/L — ABNORMAL HIGH (ref 22.0–30.0)
CREATININE: 0.8 mg/dL (ref 0.60–1.00)
EGFR CKD-EPI NON-AA FEMALE: 80 mL/min/{1.73_m2} (ref >=60)
GLUCOSE RANDOM: 99 mg/dL (ref 65–179)
POTASSIUM: 5.1 mmol/L — ABNORMAL HIGH (ref 3.5–5.0)
PROTEIN TOTAL: 7.7 g/dL (ref 6.5–8.3)
SODIUM: 138 mmol/L (ref 135–145)

## 2018-05-22 LAB — CBC W/ AUTO DIFF
BASOPHILS ABSOLUTE COUNT: 0.1 10*9/L (ref 0.0–0.1)
BASOPHILS RELATIVE PERCENT: 0.9 %
EOSINOPHILS ABSOLUTE COUNT: 0.1 10*9/L (ref 0.0–0.4)
EOSINOPHILS RELATIVE PERCENT: 2.4 %
HEMATOCRIT: 40.5 % (ref 36.0–46.0)
HEMOGLOBIN: 12.9 g/dL (ref 12.0–16.0)
LARGE UNSTAINED CELLS: 4 % (ref 0–4)
LYMPHOCYTES ABSOLUTE COUNT: 1.2 10*9/L — ABNORMAL LOW (ref 1.5–5.0)
MEAN CORPUSCULAR HEMOGLOBIN CONC: 31.8 g/dL (ref 31.0–37.0)
MEAN CORPUSCULAR VOLUME: 89.2 fL (ref 80.0–100.0)
MEAN PLATELET VOLUME: 6.6 fL — ABNORMAL LOW (ref 7.0–10.0)
MONOCYTES ABSOLUTE COUNT: 0.2 10*9/L (ref 0.2–0.8)
MONOCYTES RELATIVE PERCENT: 4.1 %
NEUTROPHILS RELATIVE PERCENT: 68.3 %
PLATELET COUNT: 353 10*9/L (ref 150–440)
RED BLOOD CELL COUNT: 4.54 10*12/L (ref 4.00–5.20)
RED CELL DISTRIBUTION WIDTH: 13.4 % (ref 12.0–15.0)
WBC ADJUSTED: 5.9 10*9/L (ref 4.5–11.0)

## 2018-05-22 LAB — FREE T4: Thyroxine.free:MCnc:Pt:Ser/Plas:Qn:: 1.39

## 2018-05-22 LAB — C4 COMPLEMENT: Complement C4:MCnc:Pt:Ser/Plas:Qn:: 21.7

## 2018-05-22 LAB — ALT (SGPT): Alanine aminotransferase:CCnc:Pt:Ser/Plas:Qn:: 28

## 2018-05-22 LAB — T3 FREE: Triiodothyronine.free:MCnc:Pt:Ser/Plas:Qn:: 4.09

## 2018-05-22 LAB — RED CELL DISTRIBUTION WIDTH: Lab: 13.4

## 2018-05-22 LAB — C3 COMPLEMENT: Complement C3:MCnc:Pt:Ser/Plas:Qn:: 108

## 2018-05-22 LAB — PROTEIN / CREATININE RATIO, URINE: PROTEIN/CREAT RATIO, URINE: 0.043

## 2018-05-22 LAB — MUCUS

## 2018-05-22 LAB — THYROID STIMULATING HORMONE: Thyrotropin:ACnc:Pt:Ser/Plas:Qn:: 0.712

## 2018-05-22 LAB — PROTEIN URINE: Protein:MCnc:Pt:Urine:Qn:: 5.9

## 2018-05-22 MED ORDER — HYDROXYCHLOROQUINE 200 MG TABLET
ORAL_TABLET | Freq: Two times a day (BID) | ORAL | 3 refills | 0.00000 days | Status: CP
Start: 2018-05-22 — End: 2018-09-10

## 2018-05-22 NOTE — Unmapped (Signed)
Bring all pill bottles to each visit.     I recommend taking calcium and vitamin D supplements for bone health. You should take in 1,200 mg of calcium daily total from dietary and over the counter supplement sources. You should take in 2,000 units of vitamin D daily from over the counter supplements.

## 2018-05-22 NOTE — Unmapped (Signed)
Memory disorder clinic  Caitlin Smith is a 62 year old with multiple medical problems including lupus, pulmonary fibrosis, bronchiolitis obliterans, hypertension, kidney disease, and type 2 diabetes.  Caitlin Smith has been treated for anxiety and fibromyalgia with clonazepam, trazodone, bupropion, and Cymbalta.    Caitlin Smith comes the neurology clinic because Caitlin Smith describes multiple problems one with her memory. Caitlin Smith describes as having herself as having 6 severe problems om the symptoms checklist, including repeating herself, forgetting past and upcoming events, getting lost in familiar places, being less interest in social activity, having difficulty understanding what Caitlin Smith hears, and difficulty making decisions.    The patient had an MRI scan 11/29/2016 for evaluation of her VIII nerve.  Caitlin Smith had surgery for resection of a small tumor and is deaf in that ear.  The MRI scan of her brain shows no significant atrophy and a few scattered T2/FLAIR hyperintensities in the deep white matter and periventricular regions.  Caitlin Smith has fewer these hyperintensities than most people her age.    The patient has diminished breath sounds on both sides, and small and trace reactive pupils bilaterally.  Caitlin Smith is deaf in her right ear and has a craniotomy scar over the occipital bone on the right.  Caitlin Smith has had a right knee replacement.  Her neurologic exam is unremarkable except that Caitlin Smith leans slightly to the right when Caitlin Smith walks.  I suspect this is related to her prior knee replacement.  Caitlin Smith scored 22 out of 30 on the MOCA.  Her most significant or problems were on visual-spatial executive function and naming and attention.  Caitlin Smith recalled 5 of 5 words at 5 minutes.    Impression  Caitlin Smith can be given a diagnosis of mild cognitive impairment.  Her most significant deficits are inattention and visual-spatial function.  I think that in the past Caitlin Smith may have been able to a little better than Caitlin Smith does at the present time.  I do not see evidence the present time for progressive neurodegenerative disorder.  I think some of her problems may be related to sleep apnea.  Caitlin Smith had a study 2-1/2 years ago which suggested Caitlin Smith may have sleep apnea when Caitlin Smith is on her back.  Caitlin Smith is also taking for four medicines that can affect her cognitive function.  Recommendations  1) It is important to follow up with your primary care and specialty doctors to maintain control of her lupus, blood pressure, cholesterol, and glucose.  2) Healthy brain aging can be encouraged by 3 activities: regular exercise, socialization, and a good diet.  In the clinical trials that prove the value of these 3 activities promote health brain aging, patients went to a senior center where they socialized and walked 45 minutes a day five days a week.  The diet recommended for patients is often called a Mediterranean diet.  This diet is rich in leafy green vegetables, vegetables, fruit, nuts, and whole-grain foods. Olive and canola oil are preferred to butter.  Cured meat, fried foods, and highly processed foods with added sugar, fructose, and glucose should be avoided.  The amount of meat in a Mediterranean diet is less than it amount typical of an American diet and fish preferred to red meat.   3) Quality sleep is important for healthy brain aging.  The following website gives tips about sleep hygiene: https://www.sleepfoundation.org/articles/sleep-hygiene. Melatonin can help some patient get and stay asleep. Melatonin which can be purchased at many drug stores and super markets without a prescription.  A dose of 10-20 mg is  recommended.  Melatonin is often recommended to induce sleep, but it is physiologically more important in maintaining sleep.    4) I recommend that the patient sleep with a tennis balls sewn into the pocket of her nightgown in the back so that Caitlin Smith does not sleep on her back.  5) I think her psychopharmacologic medicine regime is too complicated.  I would recommend that Caitlin Smith try to reduce the number of medicines.  Initially I recommend that the patient eliminate the trazodone.  I would consider changing Wellbutrin to a Zoloft or Lezapro for anxiety. When Caitlin Smith is stable I would then try to eliminate the Klonopin. I will leave this up to her primar care doctor.   6) I want to see the patient back in clinic in 6 months to reevaluate her cognitive status.

## 2018-05-22 NOTE — Unmapped (Signed)
Patient Name: Caitlin Smith [facility]  ZOX:[id number] Caitlin Ready, MD      Chief Compliant: SLE, OA, fibromyalgia follow-up    HPI: Caitlin Smith is a 62 y.o. female who is here today for follow-up for SLE, OA, and fibromyalgia.  Initially diagnosed in 2006 with SLE presenting with episcleritis and arthralgia with serology showing +ANA, +dsDNA.  She also has stage I CKD felt secondary not due to SLE but due to HTN and obesity.   In 04/2016 she was dx with eosinophilic pneumonitis, for which she follows with pulmonology. She is currently being treated with prednisone taper under pulm direction. She had difficulty tapering off prednisone due to pulm symptoms, and cellcept was added.      Interim history:  Presents today for f/u.     She states she is feeling pretty good today. She feels her lupus is stable, and does not think she has had any interim issues with her lupus.   She continues to have chronic diffuse pain which she attributes to her fibromyalgia. This is not really worse in the interim. She has some more pain in the mid to low back recently.   Endorses intermittently poor sleep. Not doing anything for exercise now.     Only complaint today is of new rashes x 2 wks. Itchy round spots over the R leg, under the R axilla, and under bra band on R rib cage. She saw her OBGYN who gave her a hydrocortisone cream which she used w/o relief. Discussed with her PCP that gave her a different cream, not sure what it was, but this also didn't help. She was told by her PCP to try an otc antifungal, just started using this so not sure if it is helpful.     Continues f/u with pulmonology. She continues myfortic and is tolerating this ok. Prednisone has been tapered to 2.5 mg qd.     ROS??: Attests to the above, otherwise, review of ten system is negative.  ??  Past Medical and Surgical History:  ??  Patient Active Problem List    Diagnosis Date Noted   ??? Hyperglycemia 10/23/2017   ??? Bronchiolitis obliterans organizing pneumonia (CMS-HCC) 09/29/2016   ??? Vertigo 09/29/2016   ??? Neck pain 12/15/2015   ??? Neck pain, musculoskeletal 12/15/2015   ??? Fibromyalgia 10/12/2015   ??? Anxiety about health 09/29/2015   ??? Abnormal computed tomography scan 04/11/2015   ??? Abnormal presence of protein in urine 04/11/2015   ??? Urinary incontinence 04/11/2015   ??? Hearing difficulty 04/11/2015   ??? Chronic constipation 04/11/2015   ??? Chronic kidney disease (CKD), stage I 04/11/2015   ??? Chronic pain not due to malignancy 04/11/2015   ??? Chronic recurrent major depressive disorder (CMS-HCC) 04/11/2015   ??? Dyslipidemia 04/11/2015   ??? Obesity, diabetes, and hypertension syndrome (CMS-HCC) 04/11/2015   ??? Intertrigo 04/11/2015   ??? Fibrositis 04/11/2015   ??? Gastroesophageal reflux disease without esophagitis 04/11/2015   ??? Vitamin D deficiency 04/11/2015   ??? Seborrhea capitis 04/11/2015   ??? Perennial allergic rhinitis with seasonal variation 04/11/2015   ??? Moderate dysplasia of vulva 04/11/2015   ??? Spinal stenosis of lumbar region 04/11/2015   ??? Low back pain 04/11/2015   ??? Keratosis pilaris 04/11/2015   ??? Persistent insomnia 04/11/2015   ??? Insomnia, persistent 04/11/2015   ??? Abnormal CAT scan 04/11/2015   ??? Absence of bladder continence 04/11/2015   ??? Auditory impairment 04/11/2015   ??? Chronic nonmalignant pain 04/11/2015   ???  Dysmetabolic syndrome 04/11/2015   ??? Eczema intertrigo 04/11/2015   ??? Gastro-esophageal reflux disease without esophagitis 04/11/2015   ??? Neurogenic claudication 04/11/2015   ??? Screening for cardiovascular condition 03/05/2015   ??? Encounter for screening for cardiovascular disorders 03/05/2015   ??? Treadmill stress test negative for angina pectoris 03/05/2015   ??? Nausea 10/30/2014   ??? Cough 05/20/2014   ??? Shortness of breath 05/20/2014   ??? Regurgitation 04/24/2014   ??? Epigastric burning sensation 04/24/2014   ??? Obesity (BMI 30-39.9) 02/03/2014   ??? Obesity with body mass index 30 or greater 02/03/2014   ??? Morbid obesity (CMS-HCC) 02/03/2014   ??? S/P total knee replacement 12/31/2013   ??? Asthma 12/31/2013   ??? History of total knee replacement 12/31/2013   ??? Asthma, chronic 12/31/2013   ??? H/O total knee replacement 12/31/2013   ??? SLE (systemic lupus erythematosus) (CMS-HCC) 11/28/2013   ??? Episcleritis 09/26/2013   ??? Constipation 07/19/2013   ??? Pre-op evaluation 07/10/2013   ??? Other diseases of vocal cords 05/28/2013   ??? Vocal cord dysfunction 05/28/2013   ??? GAD (generalized anxiety disorder) 03/19/2013   ??? Generalized anxiety disorder 03/19/2013   ??? Anxiety, generalized 03/19/2013   ??? Pain medication agreement signed 02/12/2013   ??? Family history of breast cancer    ??? 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 Backache 12/18/2012   ??? Back pain 12/18/2012   ??? Nonspecific abnormal finding of lung field 11/26/2012   ??? Multiple pulmonary nodules 11/26/2012   ??? Lung nodule, multiple 11/26/2012   ??? Pulmonary nodules 11/26/2012   ??? Osteoarthrosis 11/01/2012   ??? Osteoarthritis 11/01/2012   ??? Arthritis, degenerative 11/01/2012   ??? Postinflammatory pulmonary fibrosis (CMS-HCC) 10/22/2012   ??? Aspiration pneumonitis (CMS-HCC) 10/22/2012   ??? History of respiratory system disease 10/22/2012   ??? H/O aspiration pneumonitis 10/22/2012   ??? Mixed urge and stress incontinence 05/04/2012   ??? Mixed incontinence 05/04/2012   ??? Dysphonia 03/22/2012   ??? Sensorineural hearing loss 03/22/2012   ??? Chronic pain syndrome 05/19/2011   ??? Myalgia and myositis 02/25/2011   ??? Benign neoplasm of pituitary gland and craniopharyngeal duct (pouch) (CMS-HCC) 12/29/2010   ??? Benign neoplasm of pituitary gland and craniopharyngeal duct (CMS-HCC) 12/29/2010   ??? Chronic kidney disease, stage I 12/08/2010   ??? Proteinuria 12/08/2010   ??? Depressive disorder 11/05/2010   ??? Lupus erythematosus 11/05/2010   ??? Depression 11/05/2010   ??? Inflammatory autoimmune disorder (CMS-HCC) 11/05/2010   ??? Systemic lupus erythematosus (CMS-HCC) 11/05/2010   ??? Lung involvement in systemic lupus erythematosus (CMS-HCC) 11/05/2010   ??? Chronic nausea 07/01/2008   ??? Hypertension, benign 01/25/2005   ??? Benign hypertension 01/25/2005     Past Surgical History:   Procedure Laterality Date   ??? BRAIN SURGERY      for facial spasms   ??? BREAST BIOPSY Right 2012    Needle bx   ??? GALLBLADDER SURGERY     ??? HYSTERECTOMY N/A 07/09/2001    Vaginal Hysterectomy with ovaries in place   ??? PR ANAL PRESSURE RECORD N/A 03/22/2016    Procedure: ANORECTAL MANOMETRY;  Surgeon: Nurse-Based Giproc;  [facility];  Service: Gastroenterology   ??? PR BRONCHOSCOPY,DIAGNOSTIC W LAVAGE Bilateral 04/25/2016    Procedure: BRONCHOSCOPY, RIGID OR FLEXIBLE, INCLUDE FLUOROSCOPIC GUIDANCE WHEN PERFORMED; W/BRONCHIAL ALVEOLAR LAVAGE WITH MODERATE SEDATION;  Surgeon: Mercy Moore, MD;  Location: BRONCH PROCEDURE LAB [facility];  Service: Pulmonary   ??? PR BRONCHOSCOPY,TRANSBRONCH  BIOPSY N/A 04/25/2016    Procedure: BRONCHOSCOPY, RIGID/FLEXIBLE, INCLUDE FLUORO GUIDANCE WHEN PERFORMED; W/TRANSBRONCHIAL LUNG BX, SINGLE LOBE WITH MODERATE SEDATION;  Surgeon: Mercy Moore, MD;  Location: [facility] PROCEDURE LAB [facility];  Service: Pulmonary   ??? PR COLON CA SCRN NOT HI RSK IND  08/22/2013    Procedure: COLOREC CNCR SCR;COLNSCPY NO;  Surgeon: Brown Human, MD;  [facility];  Service: Gastroenterology   ??? PR GERD TST W/ MUCOS IMPEDE ELECTROD,>1HR N/A 05/26/2014    Procedure: ESOPHAGEAL FUNCTION TEST, GASTROESOPHAGEAL REFLUX TEST W/ NASAL CATHETER INTRALUMINAL IMPEDANCE ELECTRODE(S) PLACEMENT, RECORDING, ANALYSIS AND INTERPRETATION; PROLONGED;  Surgeon: Nurse-Based Giproc;  [facility];  Service: Gastroenterology   ??? PR TOTAL KNEE ARTHROPLASTY Right 12/31/2013    Procedure: ARTHROPLASTY, KNEE, CONDYLE & PLATEAU; MEDIAL & LAT COMPARTMENT W/WO PATELLA RESURFACE (TOTAL KNEE ARTHROP);  Surgeon: Aram Beecham, MD;  Location: MAIN OR [facility];  Service: Orthopedics   ??? PR UPPER GI ENDOSCOPY,BIOPSY N/A 11/07/2014    Procedure: UGI ENDOSCOPY; WITH BIOPSY, SINGLE OR MULTIPLE;  Surgeon: Trula Slade, MD;  Location: [facility];  Service: Gastroenterology   ??? wide local excision of vulva Right      Allergies:   ??  Allergies   Allergen Reactions   ??? Vilazodone Swelling   ??? Ace Inhibitors Other (See Comments)     Pt states she can not take ace inhibitors. Pt states she can not remember her reaction.    ??? Penicillin G Rash   ??? Penicillins Rash     Current Outpatient Medications:  ??  Current Outpatient Medications on File Prior to Visit   Medication Sig Dispense Refill   ??? amLODIPine (NORVASC) 2.5 MG tablet Take 2.5 mg by mouth daily.     ??? aspirin (ECOTRIN) 81 MG tablet Take 81 mg by mouth daily.     ??? atorvastatin (LIPITOR) 40 MG tablet Take 40 mg by mouth daily.     ??? buPROPion (WELLBUTRIN XL) 150 MG 24 hr tablet Take 1 tablet (150 mg total) by mouth daily. Take with 300 mg tablet for 450 mg total. 30 tablet 5   ??? clobetasol (TEMOVATE) 0.05 % ointment Apply topically Two (2) times a day. 15 g 1   ??? clonazePAM (KLONOPIN) 0.5 MG tablet Take 0.5 tablets (0.25 mg total) by mouth nightly. 15 tablet 1   ??? conjugated estrogens (PREMARIN) 0.625 mg/gram vaginal cream Insert 0.5 g into the vagina Two (2) times a week. 5 g 9   ??? cyclobenzaprine (FLEXERIL) 5 MG tablet Take 1 tablet (5 mg total) by mouth two (2) times a day as needed. 60 tablet 2   ??? diclofenac sodium (VOLTAREN) 1 % gel Apply 4 g topically Four (4) times a day. 300 g 5   ??? dicyclomine (BENTYL) 20 mg tablet Take 20 mg by mouth every six (6) hours.     ??? DULoxetine (CYMBALTA) 60 MG capsule Take 2 capsules (120 mg total) by mouth daily. 180 capsule 1   ??? estradiol (VAGIFEM) 10 mcg vaginal tablet Insert 1 tablet (0.01 mg total) into the vagina Two (2) times a week. 24 tablet 8   ??? fluconazole (DIFLUCAN) 150 MG tablet 1Q3D x 3days, then once weekly for 4 weeks 8 tablet 0   ??? fluticasone (FLOVENT HFA) 110 mcg/actuation inhaler Inhale 2 puffs Two (2) times a day. 3 Inhaler 3   ??? gabapentin (NEURONTIN) 300 MG capsule Take 3 capsules (900 mg total) by  mouth Three (3) times a day. 810 capsule 1   ??? hydroxychloroquine (PLAQUENIL) 200 mg tablet Take 1 tablet (200 mg total) by mouth Two (2) times a day. 180 tablet 2   ??? lamoTRIgine (LAMICTAL) 200 MG tablet Take 1 tablet (200 mg total) by mouth Two (2) times a day. 180 tablet 1   ??? meclizine (ANTIVERT) 25 mg tablet Take 25 mg by mouth Three (3) times a day as needed.      ??? metFORMIN (GLUCOPHAGE) 500 MG tablet Take 500 mg by mouth 2 (two) times a day with meals.      ??? mycophenolate (MYFORTIC) 360 MG TbEC TAKE 1 TABLET BY MOUTH TWICE DAILY 180 each 3   ??? olopatadine (PATANOL) 0.1 % ophthalmic solution Administer 1 drop to both eyes Two (2) times a day. 5 mL 1   ??? omeprazole (PRILOSEC) 40 MG capsule Take 40 mg by mouth daily.      ??? polyethylene glycol (GLYCOLAX) 17 gram/dose powder 2 cap fulls in a full glass of water, three times a day, for 5 days.     ??? predniSONE (DELTASONE) 2.5 MG tablet Take 1 tablet (2.5 mg total) by mouth daily. 90 tablet 0   ??? PROAIR HFA 90 mcg/actuation inhaler Inhale 2 puffs every six (6) hours as needed for wheezing. DAW 1 3 Inhaler 4   ??? telmisartan-hydrochlorothiazide (MICARDIS HCT) 80-25 mg per tablet Take 1 tablet by mouth daily.     ??? topiramate (TOPAMAX) 50 MG tablet Take 1 tablet (50 mg total) by mouth Two (2) times a day. 180 tablet 1   ??? traZODone (DESYREL) 100 MG tablet Take 2 tablets (200 mg total) by mouth nightly. 180 tablet 1     No current facility-administered medications on file prior to visit.      ?  ????  PHYSICAL EXAM??  Vitals:    05/22/18 1122   BP: 100/60   BP Site: L Arm   BP Position: Sitting   BP Cuff Size: Large   Pulse: 84   Temp: 36.1 ??C (97 ??F)   TempSrc: Oral   Weight: 96 kg (211 lb 10.3 oz)       General:   Pleasant 62 y.o.female in no acute distress, WDWN   Eyes:   PERRL, conjunctiva and sclera not inflamed. Tears appear adequate.    ENT:   No oropharyngeal lesions. Mucous membranes moist.    Lymph:   No masses or cervical lymphadenopathy.    Cardiovascular:  Regular rate and rhythm. No murmur, rub, or gallop. No lower extremity edema.    Lungs:  Clear to auscultation.Normal respiratory effort.    Musculoskeletal:   General: Ambulates w/o assistance   Hands: No swelling or tenderness.  Able to make a tight fist bilaterally.  Wrists: Full range of motion without swelling or tenderness  Elbows: Full range motion without pain  Shoulders: Full range of motion without pain  Hips: Good flexion  Knees: No effusion bilaterally. +crepitus on ROM.   Ankles: No swelling or tenderness  Feet: No pain with MTP squeeze   Neurological:  CN 2-12 grossly intact. 5/5 strength on extremities.   Psych:  Appropriate affect and mood   Skin:  Circular lesion on the R thigh, several under the R axilla, single lesion on R flank. Dry and erythematous. No scaling. Lesion on the mid low back with eschar formation.              ?GENERAL SUMMARY/IMPRESSION AND RECOMMENDATIONS: ??  1. SLE (RAF-HCC)  Appearing well controlled on current regimen.  Continue HCQ 200 mg BID.  Check labs noted below for disease activity and medication toxicity.   - Urinalysis with Culture Reflex  - Protein/Creatinine Ratio, Urine  - Anti-DNA antibody, double-stranded  - C3 complement  - C4 complement  - BUN  - Creatinine  - CBC w/ Differential  - CBC w/ Differential    2. Fibromyalgia  Stable. Encouraged her to resume low impact cardiovascular exercise.     3. Rash  Unclear if this is a manifestation of her lupus or infectious in origin. She has failed a low potency steroid cream, and another cream which we do not know anything about. Referral to dermatology for clarification on diagnosis. She may take zyrtec over the counter for itching.   - Ambulatory referral to Dermatology; Future    4. Eosinophilic pneumonitis  She will continue f/u with pulm for this. Myfortic and prednisone per pulmonology recommendations, currently taking myfortic 360 mg BID and prednisone 2.5 mg qd.       HCM:   - [id number] Status: 05/26/2016  - PPSV 23 Status: 08/04/2016  - Annual Influenza vaccine. Status: 05/14/18  - Bone health: Dexa 11/2017 with normal BMD. Recommend starting calcium and vitamin D supplements while taking chronic prednisone.   - Plaquenil eye exam: 12/21/17  - Contraception: postmenopausal  - Pt asked to bring all pill bottles/prescriptions to each visit.     >25 min spent in consultation with pt, >50% of which was spent discussing diagnosis and treatment options.   RTC 4 mo as scheduled with Dr Scarlette Calico

## 2018-05-24 LAB — DSDNA ANTIBODY: Lab: NEGATIVE
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
## 2018-05-31 MED FILL — MYCOPHENOLATE SODIUM 360 MG TABLET,DELAYED RELEASE: 90 days supply | Qty: 180 | Fill #1 | Status: AC

## 2018-05-31 MED FILL — MYCOPHENOLATE SODIUM 360 MG TABLET,DELAYED RELEASE: ORAL | 90 days supply | Qty: 180 | Fill #1

## 2018-06-06 ENCOUNTER — Ambulatory Visit (INDEPENDENT_AMBULATORY_CARE_PROVIDER_SITE_OTHER): Payer: Medicare Other | Admitting: Family Medicine

## 2018-06-06 ENCOUNTER — Encounter: Payer: Self-pay | Admitting: Family Medicine

## 2018-06-06 VITALS — BP 142/82 | HR 93 | Temp 98.4°F | Resp 16 | Ht 68.0 in | Wt 214.7 lb

## 2018-06-06 DIAGNOSIS — R42 Dizziness and giddiness: Secondary | ICD-10-CM

## 2018-06-06 DIAGNOSIS — L304 Erythema intertrigo: Secondary | ICD-10-CM

## 2018-06-06 DIAGNOSIS — R198 Other specified symptoms and signs involving the digestive system and abdomen: Secondary | ICD-10-CM

## 2018-06-06 MED ORDER — DOXYCYCLINE HYCLATE 100 MG PO TABS
100.0000 mg | ORAL_TABLET | Freq: Two times a day (BID) | ORAL | 0 refills | Status: DC
Start: 2018-06-06 — End: 2018-09-06

## 2018-06-06 MED ORDER — KETOCONAZOLE 2 % EX CREA: 1.0000 "application " | TOPICAL_CREAM | Freq: Every day | CUTANEOUS | 0 refills | Status: DC

## 2018-06-06 MED ORDER — FLUCONAZOLE 150 MG PO TABS: 150.0000 mg | ORAL_TABLET | ORAL | 0 refills | Status: DC

## 2018-06-06 NOTE — Progress Notes (Addendum)
Name: Nicole Ballard   MRN: [id number]    DOB: Oct 19, 1955   Date:06/06/2018       Progress Note  Subjective  Chief Complaint  Chief Complaint  Patient presents with  . Rash    Underneath her breast, arms, and navel area is not getting better-has been putting anti-fungal treatment on her with no help.  . Dizziness    States for the past month she has been very dizzy and feels like she is walking side ways    HPI  Intertrigo: she states that she has recurrent rash under breast, abdominal folds, but recently also very itchy on right axilla. She has tried topical anti-fungal creams without help and her Rheumatologist placed referral for her to see [facility] D[facility]atologist, but she came in to try something before follow up. No fever or chills.   Umbilical drainage: culture showed strep, she is using topical medication without help, still feels uncomfortable and is concerned about it.   Dizziness: she states Rheumatologist told her bp was low at recent visit, today bp is not low. She has a long history of intermittent dizziness, she sees ENT. I will not decrease bp medication dose since bp is not low, advised to stay hydrated, get up slowly and monitor for now  Patient Active Problem List   Diagnosis Date Noted  . Hyperglycemia 10/23/2017  . Vertigo 09/29/2016  . Bronchiolitis obliterans organizing pneumonia ([place]) 09/29/2016  . Neck pain, musculoskeletal 12/15/2015  . Anxiety about health 09/29/2015  . Abnormal CAT scan 04/11/2015  . Auditory impairment 04/11/2015  . Insomnia, persistent 04/11/2015  . Chronic kidney disease (CKD), stage I 04/11/2015  . Chronic nonmalignant pain 04/11/2015  . Chronic constipation 04/11/2015  . Dyslipidemia 04/11/2015  . Fibromyalgia 04/11/2015  . Gastro-esophageal reflux disease without esophagitis 04/11/2015  . Dysphonia 04/11/2015  . Absence of bladder continence 04/11/2015  . Low back pain 04/11/2015  . Eczema intertrigo 04/11/2015  . Keratosis  pilaris 04/11/2015  . Chronic recurrent major depressive disorder ([place]) 04/11/2015  . Dysmetabolic syndrome 08/67/6195  . Neurogenic claudication 04/11/2015  . Perennial allergic rhinitis with seasonal variation 04/11/2015  . Abnormal presence of protein in urine 04/11/2015  . Seborrhea capitis 04/11/2015  . Moderate dysplasia of vulva 04/11/2015  . Vitamin D deficiency 04/11/2015  . Treadmill stress test negative for angina pectoris 03/05/2015  . Shortness of breath 05/20/2014  . Morbid obesity ([place]) 02/03/2014  . Asthma, chronic 12/31/2013  . H/O total knee replacement 12/31/2013  . Episcleritis 09/26/2013  . Vocal cord dysfunction 05/28/2013  . Anxiety, generalized 03/19/2013  . Back pain 12/18/2012  . Pulmonary nodules 11/26/2012  . Lung nodule, multiple 11/26/2012  . Arthritis, degenerative 11/01/2012  . H/O aspiration pneumonitis 10/22/2012  . Postinflammatory pulmonary fibrosis ([place]) 10/22/2012  . Mixed incontinence 05/04/2012  . Benign neoplasm of pituitary gland and craniopharyngeal duct ([place]) 12/29/2010  . Lung involvement in systemic lupus erythematosus ([place]) 11/05/2010  . Chronic nausea 07/01/2008  . Benign hypertension 01/25/2005    Past Surgical History:  Procedure Laterality Date  . BRAIN SURGERY    . CHOLECYSTECTOMY    . VAGINAL HYSTERECTOMY      Family History  Problem Relation Age of Onset  . Breast cancer Mother   . Cancer Mother 34       Breast  . Cancer Father 54       Stomach  . Breast cancer Maternal Aunt     Social History   Socioeconomic History  . Marital status:  Divorced    Spouse name: Not on file  . Number of children: Not on file  . Years of education: Not on file  . Highest education level: Not on file  Occupational History  . Not on file  Social Needs  . Financial resource strain: Not on file  . Food insecurity:    Worry: Not on file    Inability: Not on file  . Transportation needs:    Medical: Not on file     Non-medical: Not on file  Tobacco Use  . Smoking status: Never Smoker  . Smokeless tobacco: Never Used  Substance and Sexual Activity  . Alcohol use: No    Alcohol/week: 0.0 standard drinks  . Drug use: No  . Sexual activity: Not Currently  Lifestyle  . Physical activity:    Days per week: Not on file    Minutes per session: Not on file  . Stress: Not on file  Relationships  . Social connections:    Talks on phone: Not on file    Gets together: Not on file    Attends religious service: Not on file    Active member of club or organization: Not on file    Attends meetings of clubs or organizations: Not on file    Relationship status: Not on file  . Intimate partner violence:    Fear of current or ex partner: Not on file    Emotionally abused: Not on file    Physically abused: Not on file    Forced sexual activity: Not on file  Other Topics Concern  . Not on file  Social History Narrative  . Not on file     Current Outpatient Medications:  .  albuterol (PROAIR HFA) 108 (90 BASE) MCG/ACT inhaler, Inhale 2 puffs into the lungs every 6 (six) hours as needed for wheezing. DX code R06.02, Disp: 3 Inhaler, Rfl: 3 .  amLODipine (NORVASC) 2.5 MG tablet, Take 1 tablet (2.5 mg total) by mouth daily., Disp: 90 tablet, Rfl: 0 .  aspirin EC 81 MG tablet, Take 1 tablet (81 mg total) by mouth daily., Disp: 30 tablet, Rfl: 0 .  atorvastatin (LIPITOR) 40 MG tablet, TAKE 1 TABLET BY MOUTH  DAILY, Disp: 90 tablet, Rfl: 1 .  azelastine (OPTIVAR) 0.05 % ophthalmic solution, Place 1 drop into both eyes 2 (two) times daily., Disp: 6 mL, Rfl: 1 .  bisacodyl (DULCOLAX) 10 MG suppository, Place rectally., Disp: , Rfl:  .  buPROPion (WELLBUTRIN XL) 300 MG 24 hr tablet, Take 1 tablet by mouth daily., Disp: , Rfl:  .  Cholecalciferol (VITAMIN D) 2000 UNITS CAPS, Take 1 capsule by mouth daily., Disp: , Rfl:  .  clindamycin (CLEOCIN) 300 MG capsule, TK 2 CS PO WITHIN 1 HOUR OF ANY DENTAL PROCEDURE, Disp: ,  Rfl:  .  clobetasol ointment (TEMOVATE) 0.05 %, Apply topically., Disp: , Rfl:  .  clonazePAM (KLONOPIN) 0.5 MG tablet, Take 0.5 tablets (0.25 mg total) by mouth daily., Disp: 15 tablet, Rfl: 0 .  conjugated estrogens (PREMARIN) vaginal cream, Place vaginally., Disp: , Rfl:  .  cyclobenzaprine (FLEXERIL) 5 MG tablet, TAKE 1 TABLET BY MOUTH  EVERY 8 HOURS AS NEEDED FOR MUSCLE SPASMS. DO NOT DRIVE FOR 8 HOURS, Disp: 90 tablet, Rfl: 1 .  diclofenac sodium (VOLTAREN) 1 % GEL, APP 2 GRAMS EXT AA QID, Disp: , Rfl: 6 .  dicyclomine (BENTYL) 20 MG tablet, Take 1 tablet (20 mg total) by mouth every 6 (six) hours  as needed (Abdominal Cramping/Pain)., Disp: 15 tablet, Rfl: 0 .  docusate sodium (COLACE) 100 MG capsule, Take by mouth., Disp: , Rfl:  .  DULoxetine (CYMBALTA) 60 MG capsule, Take 120 mg by mouth at bedtime. , Disp: , Rfl:  .  FLOVENT HFA 110 MCG/ACT inhaler, INHALE 2 PUFFS PO BID, Disp: , Rfl: 0 .  fluticasone (FLONASE) 50 MCG/ACT nasal spray, USE 2 SPRAYS IN EACH  NOSTRIL DAILY, Disp: 48 g, Rfl: 1 .  gabapentin (NEURONTIN) 300 MG capsule, Take 1 capsule by mouth at bedtime., Disp: , Rfl:  .  hydroxychloroquine (PLAQUENIL) 200 MG tablet, Take 200 mg by mouth 2 (two) times daily., Disp: , Rfl:  .  lamoTRIgine (LAMICTAL) 200 MG tablet, Take 1 tablet (200 mg total) by mouth 2 (two) times daily., Disp: 60 tablet, Rfl: 0 .  meclizine (ANTIVERT) 25 MG tablet, TAKE 1 TABLET BY MOUTH 3  TIMES DAILY AS NEEDED, Disp: 30 tablet, Rfl: 0 .  metFORMIN (GLUCOPHAGE-XR) 500 MG 24 hr tablet, TAKE 1 TABLET(500 MG) BY MOUTH DAILY WITH BREAKFAST, Disp: 90 tablet, Rfl: 1 .  Multiple Vitamin (MULTIVITAMIN) tablet, Take 1 tablet by mouth daily., Disp: , Rfl:  .  mupirocin ointment (BACTROBAN) 2 %, Place 1 application into the nose 2 (two) times daily., Disp: 22 g, Rfl: 0 .  mycophenolate (CELLCEPT) 500 MG tablet, Take 2 tablets (1,000 mg total) by mouth 2 (two) times daily., Disp: 5 tablet, Rfl: 0 .  mycophenolate  (MYFORTIC) 360 MG TBEC EC tablet, Take by mouth., Disp: , Rfl:  .  olopatadine (PATANOL) 0.1 % ophthalmic solution, Place 1 drop into both eyes 2 (two) times daily., Disp: , Rfl:  .  omeprazole (PRILOSEC) 40 MG capsule, Take 1 capsule (40 mg total) by mouth daily., Disp: 90 capsule, Rfl: 0 .  ondansetron (ZOFRAN) 4 MG tablet, TAKE 1 TABLET BY MOUTH  EVERY 8 HOURS AS NEEDED FOR NAUSEA AND VOMITING, Disp: 60 tablet, Rfl: 0 .  prednisoLONE acetate (PRED FORTE) 1 % ophthalmic suspension, Place 1 drop into both eyes 2 (two) times daily as needed., Disp: , Rfl: 0 .  predniSONE (DELTASONE) 2.5 MG tablet, Take 7.5 mg by mouth daily., Disp: , Rfl: 0 .  promethazine (PHENERGAN) 25 MG tablet, , Disp: , Rfl:  .  ranitidine (ZANTAC) 150 MG tablet, Take 1 tablet by mouth 2 (two) times daily., Disp: , Rfl:  .  telmisartan-hydrochlorothiazide (MICARDIS HCT) 80-25 MG tablet, Take 1 tablet by mouth daily., Disp: 90 tablet, Rfl: 1 .  traZODone (DESYREL) 100 MG tablet, Take 1 tablet by mouth at bedtime., Disp: , Rfl:  .  triamcinolone ointment (KENALOG) 0.1 %, , Disp: , Rfl:  .  YUVAFEM 10 MCG TABS vaginal tablet, Place 1 tablet vaginally once a week., Disp: , Rfl:  .  doxycycline (VIBRA-TABS) 100 MG tablet, Take 1 tablet (100 mg total) by mouth 2 (two) times daily., Disp: 14 tablet, Rfl: 0 .  fluconazole (DIFLUCAN) 150 MG tablet, Take 1 tablet (150 mg total) by mouth every other day., Disp: 3 tablet, Rfl: 0 .  ketoconazole (NIZORAL) 2 % cream, Apply 1 application topically daily., Disp: 15 g, Rfl: 0  Allergies  Allergen Reactions  . Ace Inhibitors     Other reaction(s): OTHER Pt states she can not take ace inhibitors. Pt states she can not remember her reaction.   Marland Kitchen Penicillins     I personally reviewed active problem list, medication list, allergies, family history, social history with the patient/caregiver  today.   ROS  Ten systems reviewed and is negative except as mentioned in HPI    Objective  Vitals:   06/06/18 1130  BP: (!) 142/82  Pulse: 93  Resp: 16  Temp: 98.4 F (36.9 C)  TempSrc: Oral  SpO2: 97%  Weight: 214 lb 11.2 oz (97.4 kg)  Height: 5\' 8"  (1.727 m)    Body mass index is 32.65 kg/m.  Physical Exam  Constitutional: Patient appears well-developed and well-nourished. Obese No distress.  HEENT: head atraumatic, normocephalic, pupils equal and reactive to light, neck supple, throat within normal limits Cardiovascular: Normal rate, regular rhythm and normal heart sounds.  No murmur heard. No BLE edema. Pulmonary/Chest: Effort normal and breath sounds normal. No respiratory distress. Abdominal: Soft.  There is no tenderness. Skin: well demarcated rash on right axilla, also some erythema and hyperpigmentation under abdominal fold. Umbilicus is erythematous with some oozing - yellow in color  Psychiatric: Patient has a normal mood and affect. behavior is normal. Judgment and thought content normal.  Recent Results (from the past 2160 hour(s))  HM PAP SMEAR     Status: None   Collection Time: 03/29/18 12:00 AM  Result Value Ref Range   HM Pap smear Normal Pap, Negative HPV,     Comment: [facility] Dr. Tressie Ellis  TSH     Status: None   Collection Time: 05/14/18  4:17 PM  Result Value Ref Range   TSH 0.94 0.40 - 4.50 mIU/L  Hemoglobin A1C w/out eAG     Status: None   Collection Time: 05/14/18  4:17 PM  Result Value Ref Range   Hgb A1c MFr Bld 5.4 <5.7 % of total Hgb    Comment: For the purpose of screening for the presence of diabetes: . <5.7%       Consistent with the absence of diabetes 5.7-6.4%    Consistent with increased risk for diabetes             (prediabetes) > or =6.5%  Consistent with diabetes . This assay result is consistent with a decreased risk of diabetes. . Currently, no consensus exists regarding use of hemoglobin A1c for diagnosis of diabetes in children. . According to American Diabetes Association (ADA) guidelines,  hemoglobin A1c <7.0% represents optimal control in non-pregnant diabetic patients. Different metrics may apply to specific patient populations.  Standards of Medical Care in Diabetes(ADA). Marland Kitchen   Aerobic culture     Status: Abnormal   Collection Time: 05/14/18  4:17 PM  Result Value Ref Range   MICRO NUMBER: [id number]    SPECIMEN QUALITY: ADEQUATE    SOURCE: OTHER (SPECIFY)    STATUS: FINAL    AER ISOLATE 1: Streptococcus agalactiae (A)     Comment: Moderate growth of Group B Streptococcus isolated Beta-hemolytic Streptococci are predictably susceptible to penicillin and other beta-lactams. Susceptibility testing not routinely performed.    PHQ2/9: Depression screen [facility] 2/9 05/14/2018 04/23/2018 11/21/2017 04/28/2017 10/31/2016  Decreased Interest 1 0 0 0 0  Down, Depressed, Hopeless 2 0 0 1 0  PHQ - 2 Score 3 0 0 1 0  Altered sleeping 2 - - - -  Tired, decreased energy 2 - - - -  Change in appetite 0 - - - -  Feeling bad or failure about yourself  1 - - - -  Trouble concentrating 1 - - - -  Moving slowly or fidgety/restless 0 - - - -  Suicidal thoughts 0 - - - -  PHQ-9 Score 9 - - - -  Difficult doing work/chores Somewhat difficult - - - -     Fall Risk: Fall Risk  05/14/2018 11/21/2017 07/07/2017 04/28/2017 10/31/2016  Falls in the past year? Yes No No No Yes  Number falls in past yr: 2 or more - - - 1  Comment Dizziness - - - -  Injury with Fall? No - - - No      Assessment & Plan  1. Intertrigo  - fluconazole (DIFLUCAN) 150 MG tablet; Take 1 tablet (150 mg total) by mouth every other day.  Dispense: 3 tablet; Refill: 0 - ketoconazole (NIZORAL) 2 % cream; Apply 1 application topically daily.  Dispense: 15 g; Refill: 0 She already an appointment scheduled with Dermatologist at [facility] for next week  2. Umbilical discharge  - doxycycline (VIBRA-TABS) 100 MG tablet; Take 1 tablet (100 mg total) by mouth 2 (two) times daily.  Dispense: 14 tablet; Refill: 0

## 2018-06-08 NOTE — Unmapped (Signed)
Dermatology Consult Note    A/P:     Inverse psoriasis - typical areas involved of gluteal cleft, lower back, right axilla, inframammary skin, umbilicus, infrapannus   - Discussed this is unrelated to fungal infections, but represents a chronic inflammatory condition in the skin. This may be related to recent prednisone taper. Would try to avoid prednisone in the future if possible.   - Will treat with topicals below. Discussed current use of cellcept is clearly not helping her psoriasis. If needed, would message pulmonologist and rheumatologist if an alternative agent is needed.   - clobetasol (TEMOVATE) 0.05 % ointment; Apply to all areas of psoriasis including the folds until no longer present  Dispense: 60 g; Refill: 1    2. SLE, eosinophilic pneumonitis   - No evidence of cutaneous lupus today   - On plaquenil per rheum  - Tapering prednisone (now 2.5mg  daily) and cellcept for pneumonitis   - In future, caution may be used with prednisone as taper can flare psoriasis     Return in about 6 weeks (around 07/23/2018) for Caitlin Smith cc - nov 22 .        CC:  Consult for rash     HPI:  Caitlin Smith is a friendly 62 y.o. year old female seen today in consultation by Flo Shanks, MD at the request of Dr. Staci Righter for evaluation of rash of the gluteal cleft, right under arm, back, umbilicus, under breasts and abdominal fold. Noted itching of her inguinal regions as well, but this has improved with clobetasol per gynecologist. She has not used this in other places, instead useing ketoconazole. May have recently been treated with oral fluconazole. This continues to get worse. It it itchy and sometimes burning. Present about 1 month.     Of note, she was initially diagnosed in 2006 with SLE presenting with episcleritis and arthralgia with serology showing +ANA, +dsDNA.  She also has stage I CKD felt secondary not due to SLE but due to HTN and obesity.     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 She is currently being treated with prednisone taper under pulm direction, currently on 2.5mg . She had difficulty tapering off prednisone due to pulm symptoms, and cellcept was added. Taper started in June/July.      No other lesions that were painful, bleeding, growing or concerning.     Pertinent PMH:   No history of skin cancer   SLE   Eosinophilic pneumonitis     FH:   No family history of melanoma     SH:   Lives in [place]     ROS:  + diffuse joint pains. All other systems reviewed are negative.     PE:  General: Friendly and conversational female in no distress, resting comfortably.  Neuro: Alert and oriented x 3, answering questions appropriately.  Skin: Inspection and palpation of the head, neck, chest, abdomen, back, bilateral upper extremities, bilateral lower extremities, genitals was performed and notable for the following:  1. Well-demarcated erythematous thin plaques, some with rim of macerated scale involving the umbilicus, gluteal cleft, midline lower back, right axilla, inframammary skin, infrapannus. Labia and inguinal folds clear. Scalp clear.     All other areas examined were normal or had no significant findings.   ______________________________________________________________________    The patient was seen and examined by Dr. Orma Flaming who agrees with the assessment and plan as above.
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
## 2018-06-11 ENCOUNTER — Ambulatory Visit: Admit: 2018-06-11 | Discharge: 2018-06-12 | Payer: MEDICARE | Attending: Dermatology | Primary: Dermatology

## 2018-06-11 ENCOUNTER — Encounter: Admit: 2018-06-11 | Discharge: 2018-06-12 | Payer: MEDICARE

## 2018-06-11 ENCOUNTER — Encounter
Admit: 2018-06-11 | Discharge: 2018-06-12 | Payer: MEDICARE | Attending: Critical Care Medicine | Primary: Critical Care Medicine

## 2018-06-11 DIAGNOSIS — J8489 Other specified interstitial pulmonary diseases: Secondary | ICD-10-CM | POA: Diagnosis not present

## 2018-06-11 DIAGNOSIS — L408 Other psoriasis: Secondary | ICD-10-CM | POA: Diagnosis not present

## 2018-06-11 DIAGNOSIS — M3219 Other organ or system involvement in systemic lupus erythematosus: Secondary | ICD-10-CM | POA: Diagnosis not present

## 2018-06-11 MED ORDER — CLOBETASOL 0.05 % TOPICAL OINTMENT
1 refills | 0 days | Status: CP
Start: 2018-06-11 — End: 2018-07-27

## 2018-06-11 NOTE — Unmapped (Addendum)
We recommend the following prednisone dose changes:     - If you are cutting 10 mg tablets in half and taking 5 mg at home, then go ahead and pick up the 2.5 mg prednisone tablets and take 1 daily for the next 2 weeks, THEN take 1 tablet every other day for 2 weeks and THEN Stop    - If you are already taking the 2.5 mg tablets and cutting them in half, then take the half tablets every other day for 2 weeks and then stop    Please continue the myortic as prescribed.     Please take your medications as prescribed.  Thank you for allowing me to be a part of your care.  Please call the clinic with any questions.    Viviann Spare, MD  Pulmonary and Critical Care Medicine  [address]  [id number]  [place], [place] [address]    Thank you for your visit to the [facility] Pulmonary [facility]ics.  You may receive a survey from [facility] regarding your visit today, and we are eager to use this feedback to improve your experience.  Thank you for taking the time to fill it out.    Between appointments, you can reach [place] at these numbers:    For appointments or the Pulmonary Nurse: [phone]  Fax: [phone]    For urgent issues after hours:  Hospital Operator: [phone], ask for Pulmonary Fellow on call

## 2018-06-11 NOTE — Unmapped (Signed)
Inverse Psoriasis     Patient Education        Psoriasis: Care Instructions  Your Care Instructions  Psoriasis (say suh-RY-uh-sus) is a long-term skin problem that causes thick, white, silvery, or red patches on the skin. The patches may be small or large, and they occur most often on the knees, elbows, scalp, hands, feet, or lower back.  The skin may be scaly. If the condition is severe, your skin can become itchy and tender. Psoriasis also can be embarrassing if the patches are on visible areas.  You can treat psoriasis with good care at home and with medicine from your doctor. You may put medicine on your skin and take pills or have shots to stop the redness and swelling. Your doctor also may suggest ultraviolet light treatments.  Follow-up care is a key part of your treatment and safety. Be sure to make and go to all appointments, and call your doctor if you are having problems. It's also a good idea to know your test results and keep a list of the medicines you take.  How can you care for yourself at home?  ?? If your doctor prescribes medicine, use it exactly as prescribed. Follow your doctor's advice for sunlight or ultraviolet light treatment. Call your doctor if you think you are having a problem with your medicine.  ?? Protect your skin:  ? Keep your skin moist. After bathing, put an ointment, cream, or lotion on your skin while it is still damp. This seals in moisture. Use over-the-counter products that your doctor suggests. These may include Cetaphil, Lubriderm, or Eucerin. Petroleum jelly (such as Vaseline) and vegetable shortening (such as Crisco) also work.  ? If you have psoriasis on your scalp, use a shampoo with salicylic acid, such as Neutrogena T/Sal.  ? Avoid harsh skin products, such as those that contain alcohol.  ? Cover your skin in cold weather.  ? Try to prevent sunburn. Although short periods of sun exposure reduce psoriasis in most people, too much sun can damage the skin and cause skin cancer. In addition, sunburns can trigger psoriasis. Use sunscreen on areas of your skin that do not have psoriasis. Make sure to use a broad-spectrum sunscreen that has a sun protection factor (SPF) of 30 or higher. Use it every day, even when it is cloudy.  ? Take care to avoid accidents such as cutting or scraping your skin. An injury to the skin can cause psoriasis patches to form anywhere on the body, including the area of the injury.  ? Avoid tight shoes, clothing, watchbands, and hats. These may irritate your skin.  ? Use a vaporizer or humidifier to add moisture to your bedroom. Follow the directions for cleaning the machine.  ?? Try making one or more changes to your daily habits to help with managing your psoriasis. For example:  ? Try to control stress and anxiety. They may cause psoriasis to appear suddenly or can make symptoms worse.  ? If you smoke, think about quitting. If you need help quitting, talk to your doctor about stop-smoking programs and medicines.  ? If you drink, limit or reduce the amount of alcohol you drink.  ? If you are overweight, see if you can lose some weight.  ?? Seek support from family and friends. Talk to a counselor or other professional if you feel sad about your condition and need more help.  When should you call for help?  Call your doctor now or seek immediate  medical care if:  ?? ?? You have signs of infection, such as:  ? Increased pain, swelling, warmth, or redness.  ? Red streaks leading from the area.  ? Pus draining from the area.  ? A fever.   ??Watch closely for changes in your health, and be sure to contact your doctor if:  ?? ?? You have swelling, stiffness, or pain in your joints.   ?? ?? You do not get better as expected.   Where can you learn more?  Go to [facility] at [email or web address]  [facility] under the Resources menu. Enter [phone] in the search box to learn more about Psoriasis: Care Instructions.  Current as of: December 04, 2017  Content Version: 12.2 ?? 2006-2019 [employer]. Care instructions adapted under license by [facility]. If you have questions about a medical condition or this instruction, always ask your healthcare professional. [employer] disclaims any warranty or liability for your use of this information.

## 2018-06-12 ENCOUNTER — Encounter
Admit: 2018-06-12 | Discharge: 2018-06-13 | Payer: MEDICARE | Attending: Student in an Organized Health Care Education/Training Program | Primary: Student in an Organized Health Care Education/Training Program

## 2018-06-12 DIAGNOSIS — F329 Major depressive disorder, single episode, unspecified: Principal | ICD-10-CM

## 2018-06-12 DIAGNOSIS — F411 Generalized anxiety disorder: Secondary | ICD-10-CM

## 2018-06-12 MED ORDER — BUPROPION HCL XL 150 MG 24 HR TABLET, EXTENDED RELEASE
ORAL_TABLET | Freq: Every day | ORAL | 2 refills | 0.00000 days | Status: CP
Start: 2018-06-12 — End: 2018-07-27

## 2018-06-12 MED ORDER — LAMOTRIGINE 200 MG TABLET
ORAL_TABLET | Freq: Two times a day (BID) | ORAL | 2 refills | 0 days | Status: CP
Start: 2018-06-12 — End: 2018-09-04

## 2018-06-12 MED ORDER — DULOXETINE 60 MG CAPSULE,DELAYED RELEASE
ORAL_CAPSULE | Freq: Every day | ORAL | 2 refills | 0 days | Status: CP
Start: 2018-06-12 — End: 2018-09-04

## 2018-06-12 MED ORDER — TRAZODONE 100 MG TABLET
ORAL_TABLET | Freq: Every evening | ORAL | 2 refills | 0.00000 days | Status: CP
Start: 2018-06-12 — End: 2018-09-04

## 2018-06-12 MED ORDER — CLONAZEPAM 0.5 MG TABLET
ORAL_TABLET | Freq: Every evening | ORAL | 2 refills | 0 days | Status: CP
Start: 2018-06-12 — End: 2018-09-04

## 2018-06-12 NOTE — Unmapped (Addendum)
[facility]  Psychiatry   Established Patient E&M Service     Assessment:  Caitlin Smith is a 62 y.o. female with a history of MDD, GAD, and Panic Disorder in the context of many chronic medical conditions including chronic pain/fibromyalgia, osteoarthritis with degenerative disc disease, Lupus, pituitary tumor (benign), s/p right total knee arthroplasty, and VIN II (s/p resection), who presents for follow-up evaluation. She has been maintained on Lamictal for many years as well as cymbalta, trazodone, klonopin, and wellbutrin. Patient has previously tolerated a slight taper in her klonopin from 0.75 mg to 0.25 mg qHS overtime. Her mood is often exacerbated by steroid use for current treatment of pulmonary disease. Patient is engaged with CBT therapy at the pain clinic.    Patient presents today with stable mood and affect, but continues to report memory difficulties as well as insomnia and some generalized anxiety. Patient recently seen by [facility] who diagnosed patient with mild cognitive impairment and recommended simplifying psychopharm regimen as polypharmacy may be contributing to patient's memory difficulties. Will start with slow taper of Wellbutrin per below given this may be contributing to patient's insomnia and anxiety. RTC in 2 months.    Risk Assessment:  A suicide and violence risk assessment was performed as part of this evaluation. There patient is deemed to be at chronic elevated risk for self-harm/suicide given the following factors: divorced, recent onset of serious medical condition, current diagnosis of depression, chronic severe medical condition and chronic mental illness > 5 years. The patient is deemed to be at chronic elevated risk for violence given the following factors: N/A. These risk factors are mitigated by the following factors:lack of active SI/HI, no know access to weapons or firearms, no history of violence, motivation for treatment, utilization of positive coping skills, supportive family, sense of responsibility to family and social supports, religious or spiritual prohibition to suicide/violence, current treatment compliance, safe housing and presence of a safety plan with follow-up care. There is no acute risk for suicide or violence at this time. The patient was educated about relevant modifiable risk factors including following recommendations for treatment of psychiatric illness and abstaining from substance abuse.    While future psychiatric events cannot be accurately predicted, the patient does not currently require acute inpatient psychiatric care and does not currently meet [facility] involuntary commitment criteria.      Stressors: chronic medical illness including chronic pain, lupus, physical limitations     Plan:  # Depression   - Continue Lamictal 400mg  qhs for mood  - Continue Cymbalta 120mg  qAM to target depressive sx and chronic pain  - DECREASE Wellbutrin XL to 300mg  qAM (dec 10/8) with plan for gradual taper and discontinuation given current insomnia/anxiety and polypharmacy which may be contributing to memory deficits  - Continue CBT at pain clinic    # Insomnia: Last sleep study performed in 2017. Per chart review, showed mild OSA likely related to medication use. Neurology recommends patient avoid sleeping on back as she likely has OSA when sleeping in this position  - Continue Trazodone 200mg  qHS for insomnia. May plan to discontinue in future if insomnia improves after discontinuation of Wellbutrin and initiation of melatonin.  - START OTC melatonin q1800  - Taper Wellbutrin per above    # Mild Neurocognitive Impairment: Patient seen by Neurology ([facility]) 05/22/18 who dx pt with mild neurocognitive impairment w/ deficits in inattention and visual-spatial function with suspicion that sleep apnea and polypharmacy may  be contributing to her difficulties. Recommended that psychopharm regimen be simplified, including eliminating trazodone and considering changing Wellbutrin to Zoloft or Lexapro for anxiety and eliminating Klonopin when stable.  - Plan for gradual taper of Wellbutrin per above  - Will consider taper/discontinuation of Trazodone vs Klonopin next if sleep improves after above changes    # Anxiety, Panic Disorder  - Continue Cymbalta as above  - Continue Clonazepam 0.25mg  qHS, appropriate filling per NCCSRS  ??  # Medical Monitoring - High Risk Medication Use  - The complexity of this patient's care involves drug therapy which requires intensive monitoring for toxicity. This is in part due to a narrow therapeutic window, as well as potential for toxicity. This patient is being treated with Lamotrigine (Lamictal) to target their psychiatric disorder, Major depressive disorder, refractory. In regards to this medication being considered high risk, Lamotrigine (lamictal) has black box warning for serious rash including fatal reaction of Stevens-Johnson syndrome and rare cases of toxic epidermal necrolysis. In addition to prolonged titration to mitigate risk of serious rash, lamotrigine requires monitoring of hepatic and renal function at baseline, and at times will require monitoring of lamotrigine blood levels.Marland Kitchen  LFTs   Lab Results   Component Value Date    AST 29 05/22/2018    AST 30 05/01/2018    AST 26 01/31/2018    AST 32 11/05/2014    AST 30 05/08/2014    AST 30 01/23/2014      Lab Results   Component Value Date    ALT 28 05/22/2018    ALT 28 05/01/2018    ALT 24 01/31/2018    ALT 21 11/05/2014    ALT 31 05/08/2014    ALT 30 01/23/2014   Platelets    Lab Results   Component Value Date    PLT 353 05/22/2018    PLT 430 05/01/2018    PLT 377 01/31/2018    PLT 304 11/05/2014    PLT 347 05/08/2014    PLT 484 (H) 01/23/2014   Lamotrigine Monitoring  - Hepatic function testing, platelets (via CBC) q6 months   - Last obtained 05/2018   - Due 11/2018    # Return to Clinic: Follow-up in Psychosomatic clinic in 2 month(s). Discussed that if she has any issues to call and schedule an appointment earlier.    Psychotherapy:  No billable psychotherapy service provided but brief supportive therapy was utilized.    Patient has been given this writer's contact information as well as the [facility] urgent line number. The patient has been instructed to call 911 for emergencies.    Patient was seen and plan of care was discussed with the Attending MD, Dr. Elita Boone, who agrees with the above statement and plan.     Angelyn Punt, MD          Subjective:     Psychiatric Chief Concern:  Follow-up psychiatric evaluation for MDD, GAD    Interval History:  Patient last seen by previous resident, Dr. Register, on 02/27/18.  Since last visit, patient was seen by neurology on 9/17 regarding memory difficulties. It was noted that patient has mild cognitive impairment with most significant deficits in inattention and visual-spatial function with suspicion that sleep apnea and polypharmacy may be contributing to her difficulties. Neurology recommended that psychopharm regimen be simplified, including eliminating trazodone and considering changing Wellbutrin to Zoloft or Lexapro for anxiety and eliminating Klonopin when stable.    Today, patient reports, things have been going mostly good,  but she continues to endorse some anxiety/nerves. States, I feel blue sometimes, but not a lot.  She has been working on eating healthier and reports 40 lb intentional weight loss since February/March by eliminating junk food/sweets and eating more fruits/vegetables.  She has been on a prednisone taper since June/July for eosinophilic pneumonitis, but is scheduled to come off steroids in two weeks.  She is still participating in CBT once every 2 months which she reports is helpful and is active in her church community.    Continues to endorse memory difficulties-- losing her car in the parking lot, forgetting why she goes into the kitchen, calling her grandkids by the wrong name  She says this has been going on for the past two years.     Reports difficulty falling asleep and staying asleep. Reports waking up around 2am and having difficulty going back to bed.  Patient says she had a sleep study in the past 3 years at [facility] and was told that she did not need a CPAP      NCCSRS: Reviewed; Appropriate filling. Since last visit:    Fill Date ID Written Drug Qty Days Prescriber Rx # Pharmacy Refill Daily Dose * Pymt Type PMP   05/21/2018  1   04/09/2018  Clonazepam 0.5 MG Tablet  15.00 30 Un Pha  [id number]   [facility] ([id number])  1  0.50 LME  Medicare  [place]   04/13/2018  1   04/09/2018  Clonazepam 0.5 MG Tablet  15.00 30 Un Pha  [id number]   [facility] ([id number])  0  0.50 LME  Medicare  [place]   03/11/2018  1   12/12/2017  Clonazepam 0.5 MG Tablet  15.00 30 Un Pha  [id number]   [facility] ([id number])  2  0.50 LME  Medicare  [place]       Social History: reviewed; pertinents have been documented in the interval history section.  She has a son who lives in [place] and a daughter in [place] who are both supportive. +several grandchildren    ROS:  As per Interval History and:  Constitutional: +memory deficits  Neuro:  +shaky sometimes when anxious      Objective:    Mental Status Exam:  Appearance:    Appears stated age, Well nourished and Clean/Neat   Motor:   No abnormal movements   Speech/Language:    Normal rate, volume, tone, fluency and Language intact, well formed   Mood:  mostly good, I feel blue sometimes, but not a lot.   Affect:   Calm, Cooperative and Euthymic   Thought process and Associations:   Logical, linear, clear, coherent, goal directed   Abnormal/psychotic thought content:     Denies SI, HI, self harm, delusions, obsessions, paranoid ideation, or ideas of reference    Perceptual disturbances:     Denies auditory and visual hallucinations, behavior not concerning for response to internal stimuli     Orientation:   Oriented to person, place, time, and general circumstances   Attention and Concentration:   Able to fully concentrate and attend   Memory:   Immediate, short-term, long-term, and recall grossly intact    Fund of knowledge:    Consistent with level of education and development   Insight:     Fair   Judgment:    Fair   Impulse Control:   Fair       Medications: reviewed at today's visit    Vitals:   Vitals:    06/12/18 0850  BP: 116/65   Pulse: 79     Vitals:    06/12/18 0850   Weight: 94.3 kg (208 lb)       PE:   Vital signs were reviewed.      General: No acute distress  Pulm: Non-labored respirations  Neuro: Spontaneously moving all 4 limbs without obvious deficits.  Gait and station assessed with no abnormalities    Psychometrics:  MOCA 22/30 on 12/12/17  Missed points for: Visuospatial (unable to complete pattern or copy cube), Serial 7's (able to only get 93 and 86), Language (difficulty with naming words that begin with F), Abstraction (unable to state how watch and ruler similar), and Delayed recall (2/5 on first attempt but got all 5 after category clue was given)    Angelyn Punt, MD  06/12/2018

## 2018-06-12 NOTE — Unmapped (Addendum)
Please decrease Wellbutrin to 300mg  in the morning. I will call you after 2 weeks to check in and we may decrease further depending on how you are feeling    You can start taking Over-the-counter melatonin 3mg  or 5mg  around 6pm at night. This may help with sleep    Please call me at [phone] with any questions or concerns    Follow-up instructions:  -- Please continue taking your medications as prescribed for your mental health.   -- Do not make changes to your medications, including taking more or less than prescribed, unless under the supervision of your physician. Be aware that some medications may make you feel worse if abruptly stopped  -- Please refrain from using illicit substances, as these can affect your mood and could cause anxiety or other concerning symptoms.   -- Seek further medical care for any increase in symptoms or new symptoms such as thoughts of wanting to hurt yourself or hurt others.     Contact info:  Life-threatening emergencies: call 911 or go to the nearest ER for medical or psychiatric attention.     Issues that need urgent attention but are not life threatening: call the clinic outpatient frontdesk at [phone] for assistance.     Non-urgent routine concerns, questions, and refill requests: please leave me a voicemail at [phone] and I will get back to you within 2 business days.     Regarding appointments:  - If you need to cancel your appointment, we ask that you call [phone] at least 24 hours before your scheduled appointment.  - If for any reason you arrive 15 minutes later than your scheduled appointment time, you may not be seen and your visit may be rescheduled.  - Please remember that we will not automatically reschedule missed appointments.  - If you miss two (2) appointments without letting us know in advance, you will likely be referred to a provider in your community.  - We will do our best to be on time. Sometimes an emergency will arise that might cause your clinician to be late. We will try to inform you of this when you check in for your appointment. If you wait more than 15 minutes past your appointment time without such notice, please speak with the front desk staff.    In the event of bad weather, the clinic staff will attempt to contact you, should your appointment need to be rescheduled. Additionally, you can call the Patient Weather Line [phone] for system-wide clinic status    For more information and reminders regarding clinic policies (these were provided when you were admitted to the clinic), please ask the front desk.

## 2018-06-15 ENCOUNTER — Other Ambulatory Visit: Payer: Self-pay | Admitting: Family Medicine

## 2018-06-15 DIAGNOSIS — K219 Gastro-esophageal reflux disease without esophagitis: Secondary | ICD-10-CM

## 2018-06-15 MED ORDER — FLUTICASONE PROPIONATE 110 MCG/ACTUATION HFA AEROSOL INHALER
Freq: Two times a day (BID) | RESPIRATORY_TRACT | 3 refills | 0 days | Status: CP
Start: 2018-06-15 — End: 2018-09-20

## 2018-07-02 ENCOUNTER — Ambulatory Visit
Admission: RE | Admit: 2018-07-02 | Discharge: 2018-07-02 | Disposition: A | Discharge status: Home / Self Care | Payer: Medicare Other | Source: Ambulatory Visit | Attending: Nurse Practitioner | Admitting: Nurse Practitioner

## 2018-07-02 ENCOUNTER — Ambulatory Visit (INDEPENDENT_AMBULATORY_CARE_PROVIDER_SITE_OTHER): Payer: Medicare Other | Admitting: Nurse Practitioner

## 2018-07-02 ENCOUNTER — Encounter: Payer: Self-pay | Admitting: Nurse Practitioner

## 2018-07-02 ENCOUNTER — Other Ambulatory Visit: Payer: Self-pay | Admitting: Nurse Practitioner

## 2018-07-02 VITALS — BP 100/60 | HR 98 | Temp 98.8°F | Resp 16 | Ht 68.0 in | Wt 205.9 lb

## 2018-07-02 DIAGNOSIS — R10811 Right upper quadrant abdominal tenderness: Secondary | ICD-10-CM | POA: Diagnosis not present

## 2018-07-02 DIAGNOSIS — R1013 Epigastric pain: Secondary | ICD-10-CM | POA: Diagnosis not present

## 2018-07-02 MED ORDER — POLYETHYLENE GLYCOL 3350 17 GM/SCOOP PO POWD: 17.0000 g | Freq: Once | ORAL | 0 refills | Status: AC

## 2018-07-02 MED ORDER — SIMETHICONE 125 MG PO CAPS: 1.0000 | ORAL_CAPSULE | Freq: Two times a day (BID) | ORAL | 0 refills | Status: DC | PRN

## 2018-07-02 NOTE — Progress Notes (Signed)
Name: Nicole Ballard   MRN: [id number]    DOB: 06-14-1956   Date:07/02/2018       Progress Note  Subjective  Chief Complaint  Chief Complaint  Patient presents with  . Abdominal Pain    HPI   Patient presents with epigastric abdominal pain that shoots through to back has been constant but intermittently gets worse. States feels bloated. Taken tylenol without relief, takes acid reflux medicine and tried gingerale- states burped some and it relieved some pain. Denies diarrhea, states passing lots of gas, states had two bowel movements on Saturday does have some straining; normally daily BMs . States has had this pain before and was told it was constipation. Pain is worse after eating. Has been eating a lot of brussels sprouts, broccoli, and chili.   No fevers, chills, nausea, vomiting, blood in stools.    Patient Active Problem List   Diagnosis Date Noted  . Hyperglycemia 10/23/2017  . Vertigo 09/29/2016  . Bronchiolitis obliterans organizing pneumonia ([place]) 09/29/2016  . Neck pain, musculoskeletal 12/15/2015  . Anxiety about health 09/29/2015  . Abnormal CAT scan 04/11/2015  . Auditory impairment 04/11/2015  . Insomnia, persistent 04/11/2015  . Chronic kidney disease (CKD), stage I 04/11/2015  . Chronic nonmalignant pain 04/11/2015  . Chronic constipation 04/11/2015  . Dyslipidemia 04/11/2015  . Fibromyalgia 04/11/2015  . Gastro-esophageal reflux disease without esophagitis 04/11/2015  . Dysphonia 04/11/2015  . Absence of bladder continence 04/11/2015  . Low back pain 04/11/2015  . Eczema intertrigo 04/11/2015  . Keratosis pilaris 04/11/2015  . Chronic recurrent major depressive disorder ([place]) 04/11/2015  . Dysmetabolic syndrome 48/18/5631  . Neurogenic claudication 04/11/2015  . Perennial allergic rhinitis with seasonal variation 04/11/2015  . Abnormal presence of protein in urine 04/11/2015  . Seborrhea capitis 04/11/2015  . Moderate dysplasia of vulva 04/11/2015  .  Vitamin D deficiency 04/11/2015  . Treadmill stress test negative for angina pectoris 03/05/2015  . Shortness of breath 05/20/2014  . Morbid obesity ([place]) 02/03/2014  . Asthma, chronic 12/31/2013  . H/O total knee replacement 12/31/2013  . Episcleritis 09/26/2013  . Vocal cord dysfunction 05/28/2013  . Anxiety, generalized 03/19/2013  . Back pain 12/18/2012  . Pulmonary nodules 11/26/2012  . Lung nodule, multiple 11/26/2012  . Arthritis, degenerative 11/01/2012  . H/O aspiration pneumonitis 10/22/2012  . Postinflammatory pulmonary fibrosis ([place]) 10/22/2012  . Mixed incontinence 05/04/2012  . Benign neoplasm of pituitary gland and craniopharyngeal duct ([place]) 12/29/2010  . Lung involvement in systemic lupus erythematosus ([place]) 11/05/2010  . Chronic nausea 07/01/2008  . Benign hypertension 01/25/2005    Past Medical History:  Diagnosis Date  . Anxiety   . Bipolar 1 disorder ([place])   . Chronic kidney disease   . Chronic pain   . Diabetes mellitus without complication ([place])   . Fibromyalgia   . GERD (gastroesophageal reflux disease)   . Hearing loss of right ear   . Hemifacial spasm   . Hyperlipidemia   . Hypertension   . Insomnia   . Lumbago   . Osteoarthritis   . Paresthesia   . Rhinitis   . Systemic lupus erythematosus ([place])   . Vitamin D deficiency     Past Surgical History:  Procedure Laterality Date  . BRAIN SURGERY    . CHOLECYSTECTOMY    . VAGINAL HYSTERECTOMY      Social History   Tobacco Use  . Smoking status: Never Smoker  . Smokeless tobacco: Never Used  Substance Use Topics  . Alcohol  use: No    Alcohol/week: 0.0 standard drinks     Current Outpatient Medications:  .  amLODipine (NORVASC) 2.5 MG tablet, Take 1 tablet (2.5 mg total) by mouth daily., Disp: 90 tablet, Rfl: 0 .  aspirin EC 81 MG tablet, Take 1 tablet (81 mg total) by mouth daily., Disp: 30 tablet, Rfl: 0 .  atorvastatin (LIPITOR) 40 MG tablet, TAKE 1 TABLET BY MOUTH  DAILY, Disp: 90  tablet, Rfl: 1 .  azelastine (OPTIVAR) 0.05 % ophthalmic solution, Place 1 drop into both eyes 2 (two) times daily., Disp: 6 mL, Rfl: 1 .  bisacodyl (DULCOLAX) 10 MG suppository, Place rectally., Disp: , Rfl:  .  buPROPion (WELLBUTRIN XL) 300 MG 24 hr tablet, Take 1 tablet by mouth daily., Disp: , Rfl:  .  Cholecalciferol (VITAMIN D) 2000 UNITS CAPS, Take 1 capsule by mouth daily., Disp: , Rfl:  .  clindamycin (CLEOCIN) 300 MG capsule, TK 2 CS PO WITHIN 1 HOUR OF ANY DENTAL PROCEDURE, Disp: , Rfl:  .  clobetasol ointment (TEMOVATE) 0.05 %, Apply topically., Disp: , Rfl:  .  clonazePAM (KLONOPIN) 0.5 MG tablet, Take 0.5 tablets (0.25 mg total) by mouth daily., Disp: 15 tablet, Rfl: 0 .  conjugated estrogens (PREMARIN) vaginal cream, Place vaginally., Disp: , Rfl:  .  cyclobenzaprine (FLEXERIL) 5 MG tablet, TAKE 1 TABLET BY MOUTH  EVERY 8 HOURS AS NEEDED FOR MUSCLE SPASMS. DO NOT DRIVE FOR 8 HOURS, Disp: 90 tablet, Rfl: 1 .  diclofenac sodium (VOLTAREN) 1 % GEL, APP 2 GRAMS EXT AA QID, Disp: , Rfl: 6 .  dicyclomine (BENTYL) 20 MG tablet, Take 1 tablet (20 mg total) by mouth every 6 (six) hours as needed (Abdominal Cramping/Pain)., Disp: 15 tablet, Rfl: 0 .  docusate sodium (COLACE) 100 MG capsule, Take by mouth., Disp: , Rfl:  .  doxycycline (VIBRA-TABS) 100 MG tablet, Take 1 tablet (100 mg total) by mouth 2 (two) times daily., Disp: 14 tablet, Rfl: 0 .  DULoxetine (CYMBALTA) 60 MG capsule, Take 120 mg by mouth at bedtime. , Disp: , Rfl:  .  FLOVENT HFA 110 MCG/ACT inhaler, INHALE 2 PUFFS PO BID, Disp: , Rfl: 0 .  fluconazole (DIFLUCAN) 150 MG tablet, Take 1 tablet (150 mg total) by mouth every other day., Disp: 3 tablet, Rfl: 0 .  fluticasone (FLONASE) 50 MCG/ACT nasal spray, USE 2 SPRAYS IN EACH  NOSTRIL DAILY, Disp: 48 g, Rfl: 1 .  gabapentin (NEURONTIN) 300 MG capsule, Take 1 capsule by mouth at bedtime., Disp: , Rfl:  .  hydroxychloroquine (PLAQUENIL) 200 MG tablet, Take 200 mg by mouth 2 (two)  times daily., Disp: , Rfl:  .  ketoconazole (NIZORAL) 2 % cream, Apply 1 application topically daily., Disp: 15 g, Rfl: 0 .  lamoTRIgine (LAMICTAL) 200 MG tablet, Take 1 tablet (200 mg total) by mouth 2 (two) times daily., Disp: 60 tablet, Rfl: 0 .  meclizine (ANTIVERT) 25 MG tablet, TAKE 1 TABLET BY MOUTH 3  TIMES DAILY AS NEEDED, Disp: 30 tablet, Rfl: 0 .  metFORMIN (GLUCOPHAGE-XR) 500 MG 24 hr tablet, TAKE 1 TABLET(500 MG) BY MOUTH DAILY WITH BREAKFAST, Disp: 90 tablet, Rfl: 1 .  Multiple Vitamin (MULTIVITAMIN) tablet, Take 1 tablet by mouth daily., Disp: , Rfl:  .  mupirocin ointment (BACTROBAN) 2 %, Place 1 application into the nose 2 (two) times daily., Disp: 22 g, Rfl: 0 .  mycophenolate (MYFORTIC) 360 MG TBEC EC tablet, Take by mouth., Disp: , Rfl:  .  olopatadine (PATANOL) 0.1 % ophthalmic solution, Place 1 drop into both eyes 2 (two) times daily., Disp: , Rfl:  .  omeprazole (PRILOSEC) 40 MG capsule, TAKE 1 CAPSULE BY MOUTH  DAILY, Disp: 90 capsule, Rfl: 0 .  ondansetron (ZOFRAN) 4 MG tablet, TAKE 1 TABLET BY MOUTH  EVERY 8 HOURS AS NEEDED FOR NAUSEA AND VOMITING, Disp: 60 tablet, Rfl: 0 .  prednisoLONE acetate (PRED FORTE) 1 % ophthalmic suspension, Place 1 drop into both eyes 2 (two) times daily as needed., Disp: , Rfl: 0 .  predniSONE (DELTASONE) 2.5 MG tablet, Take 7.5 mg by mouth daily., Disp: , Rfl: 0 .  promethazine (PHENERGAN) 25 MG tablet, , Disp: , Rfl:  .  ranitidine (ZANTAC) 150 MG tablet, Take 1 tablet by mouth 2 (two) times daily., Disp: , Rfl:  .  telmisartan-hydrochlorothiazide (MICARDIS HCT) 80-25 MG tablet, Take 1 tablet by mouth daily., Disp: 90 tablet, Rfl: 1 .  traZODone (DESYREL) 100 MG tablet, Take 1 tablet by mouth at bedtime., Disp: , Rfl:  .  triamcinolone ointment (KENALOG) 0.1 %, , Disp: , Rfl:  .  YUVAFEM 10 MCG TABS vaginal tablet, Place 1 tablet vaginally once a week., Disp: , Rfl:  .  albuterol (PROAIR HFA) 108 (90 BASE) MCG/ACT inhaler, Inhale 2 puffs  into the lungs every 6 (six) hours as needed for wheezing. DX code R06.02 (Patient not taking: Reported on 07/02/2018), Disp: 3 Inhaler, Rfl: 3 .  mycophenolate (CELLCEPT) 500 MG tablet, Take 2 tablets (1,000 mg total) by mouth 2 (two) times daily. (Patient not taking: Reported on 07/02/2018), Disp: 5 tablet, Rfl: 0  Allergies  Allergen Reactions  . Ace Inhibitors     Other reaction(s): OTHER Pt states she can not take ace inhibitors. Pt states she can not remember her reaction.   Marland Kitchen Penicillins     ROS  No other specific complaints in a complete review of systems (except as listed in HPI above).  Objective  Vitals:   07/02/18 1004  BP: 100/60  Pulse: 98  Resp: 16  Temp: 98.8 F (37.1 C)  TempSrc: Oral  SpO2: 97%  Weight: 205 lb 14.4 oz (93.4 kg)  Height: 5\' 8"  (1.727 m)    Body mass index is 31.31 kg/m.  Nursing Note and Vital Signs reviewed.  Physical Exam  Constitutional: She is oriented to person, place, and time. She appears well-developed and well-nourished.  HENT:  Head: Normocephalic and atraumatic.  Neck: Normal range of motion. Neck supple. Carotid bruit is not present.  Cardiovascular: Normal rate, regular rhythm, normal heart sounds and intact distal pulses.  Pulmonary/Chest: Effort normal and breath sounds normal.  Abdominal: Soft. Bowel sounds are normal. She exhibits distension (mild). She exhibits no ascites and no mass. There is tenderness in the right upper quadrant and epigastric area. There is no rigidity, no CVA tenderness, no tenderness at McBurney's point and negative Murphy's sign.  Musculoskeletal: Normal range of motion.  Neurological: She is alert and oriented to person, place, and time. She has normal strength. No sensory deficit. GCS eye subscore is 4. GCS verbal subscore is 5. GCS motor subscore is 6.  Skin: Skin is warm, dry and intact. Capillary refill takes less than 2 seconds.  Psychiatric: She has a normal mood and affect. Her speech is  normal and behavior is normal. Judgment and thought content normal.  Vitals reviewed.    No results found for this or any previous visit (from the past 48 hour(s)).  Assessment &  Plan  1. Epigastric pain Discussed low gas diet, and ER precautions. Consider intestinal gas, constipation/ obstruction, GERD, ulcer, gastritis, hernia.  - COMPLETE METABOLIC PANEL WITH GFR - CBC with Differential - Lipase - DG Abd 2 Views; Future - H. pylori breath test - Simethicone 125 MG CAPS; Take 1 capsule (125 mg total) by mouth 2 (two) times daily as needed.  Dispense: 28 each; Refill: 0  2. Right upper quadrant abdominal tenderness without rebound tenderness  - COMPLETE METABOLIC PANEL WITH GFR - CBC with Differential - Lipase - DG Abd 2 Views; Future - Simethicone 125 MG CAPS; Take 1 capsule (125 mg total) by mouth 2 (two) times daily as needed.  Dispense: 28 each; Refill: 0  -Red flags and when to present for emergency care or RTC including fever >101.67F, chest pain, shortness of breath, new/worsening/un-resolving symptoms,  reviewed with patient at time of visit. Follow up and care instructions discussed and provided in AVS.

## 2018-07-02 NOTE — Patient Instructions (Addendum)
-   Can try the bentyl to see if that helps - Please go across the street to get abdominal x-ray  - Sending you simethicone to help with gas pains; avoid gassy foods discussed below - If having severe abdominal pain, chest pain, shortness of breath, vomiting please seek immediate medical attention  Abdominal Bloating When you have abdominal bloating, your abdomen may feel full, tight, or painful. It may also look bigger than normal or swollen (distended). Common causes of abdominal bloating include:  Swallowing air.  Constipation.  Problems digesting food.  Eating too much.  Irritable bowel syndrome. This is a condition that affects the large intestine.  Lactose intolerance. This is an inability to digest lactose, a natural sugar in dairy products.  Celiac disease. This is a condition that affects the ability to digest gluten, a protein found in some grains.  Gastroparesis. This is a condition that slows down the movement of food in the stomach and small intestine. It is more common in people with diabetes mellitus.  Gastroesophageal reflux disease (GERD). This is a digestive condition that makes stomach acid flow back into the esophagus.  Urinary retention. This means that the body is holding onto urine, and the bladder cannot be emptied all the way.  Follow these instructions at home: Eating and drinking  Avoid eating too much.  Try not to swallow air while talking or eating.  Avoid eating while lying down.  Avoid these foods and drinks: ? Foods that cause gas, such as broccoli, cabbage, cauliflower, brussels sprouts and baked beans. ? Carbonated drinks. ? Hard candy. ? Chewing gum. Medicines  Take over-the-counter and prescription medicines only as told by your health care provider.  Take probiotic medicines. These medicines contain live bacteria or yeasts that can help digestion.  Take coated peppermint oil capsules. Activity  Try to exercise regularly. Exercise  may help to relieve bloating that is caused by gas and relieve constipation. General instructions  Keep all follow-up visits as told by your health care provider. This is important. Contact a health care provider if:  You have nausea and vomiting.  You have diarrhea.  You have abdominal pain.  You have unusual weight loss or weight gain.  You have severe pain, and medicines do not help. Get help right away if:  You have severe chest pain.  You have trouble breathing.  You have shortness of breath.  You have trouble urinating.  You have darker urine than normal.  You have blood in your stools or have dark, tarry stools. Summary  Abdominal bloating means that the abdomen is swollen.  Common causes of abdominal bloating are swallowing air, constipation, and problems digesting food.  Avoid eating too much and avoid swallowing air.  Avoid foods that cause gas, carbonated drinks, hard candy, and chewing gum. This information is not intended to replace advice given to you by your health care provider. Make sure you discuss any questions you have with your health care provider. Document Released: 09/23/2016 Document Revised: 09/23/2016 Document Reviewed: 09/23/2016 Elsevier Interactive Patient Education  [employer].

## 2018-07-03 LAB — CBC WITH DIFFERENTIAL/PLATELET
BASOS ABS: 69 {cells}/uL (ref 0–200)
Basophils Relative: 1.6 %
EOS PCT: 2.8 %
Eosinophils Absolute: 120 cells/uL (ref 15–500)
HEMATOCRIT: 38.4 % (ref 35.0–45.0)
Hemoglobin: 12.7 g/dL (ref 11.7–15.5)
Lymphs Abs: 1759 cells/uL (ref 850–3900)
MCH: 28.7 pg (ref 27.0–33.0)
MCHC: 33.1 g/dL (ref 32.0–36.0)
MCV: 86.9 fL (ref 80.0–100.0)
MPV: 10 fL (ref 7.5–12.5)
Monocytes Relative: 13.4 %
Neutro Abs: 1776 cells/uL (ref 1500–7800)
Neutrophils Relative %: 41.3 %
Platelets: 342 10*3/uL (ref 140–400)
RBC: 4.42 10*6/uL (ref 3.80–5.10)
RDW: 12.5 % (ref 11.0–15.0)
Total Lymphocyte: 40.9 %
WBC mixed population: 576 cells/uL (ref 200–950)
WBC: 4.3 10*3/uL (ref 3.8–10.8)

## 2018-07-03 LAB — COMPLETE METABOLIC PANEL WITH GFR
AG Ratio: 1.1 (calc) (ref 1.0–2.5)
ALT: 18 U/L (ref 6–29)
AST: 27 U/L (ref 10–35)
Albumin: 3.8 g/dL (ref 3.6–5.1)
Alkaline phosphatase (APISO): 65 U/L (ref 33–130)
BILIRUBIN TOTAL: 0.4 mg/dL (ref 0.2–1.2)
BUN: 13 mg/dL (ref 7–25)
CHLORIDE: 99 mmol/L (ref 98–110)
CO2: 33 mmol/L — ABNORMAL HIGH (ref 20–32)
Calcium: 9.3 mg/dL (ref 8.6–10.4)
Creat: 0.97 mg/dL (ref 0.50–0.99)
GFR, EST AFRICAN AMERICAN: 74 mL/min/{1.73_m2} (ref >=60)
GFR, Est Non African American: 63 mL/min/{1.73_m2} (ref >=60)
Globulin: 3.4 g/dL (calc) (ref 1.9–3.7)
Glucose, Bld: 80 mg/dL (ref 65–139)
POTASSIUM: 4.3 mmol/L (ref 3.5–5.3)
Sodium: 138 mmol/L (ref 135–146)
TOTAL PROTEIN: 7.2 g/dL (ref 6.1–8.1)

## 2018-07-03 LAB — LIPASE: LIPASE: 16 U/L (ref 7–60)

## 2018-07-03 LAB — H. PYLORI BREATH TEST: H. PYLORI BREATH TEST: NOT DETECTED
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
## 2018-07-09 ENCOUNTER — Encounter: Admit: 2018-07-09 | Discharge: 2018-07-09 | Payer: MEDICARE

## 2018-07-09 ENCOUNTER — Ambulatory Visit: Admit: 2018-07-09 | Discharge: 2018-07-09 | Payer: MEDICARE | Attending: Psychologist | Primary: Psychologist

## 2018-07-09 DIAGNOSIS — M542 Cervicalgia: Secondary | ICD-10-CM | POA: Diagnosis not present

## 2018-07-09 DIAGNOSIS — M797 Fibromyalgia: Secondary | ICD-10-CM | POA: Diagnosis not present

## 2018-07-09 DIAGNOSIS — G894 Chronic pain syndrome: Secondary | ICD-10-CM | POA: Diagnosis not present

## 2018-07-09 DIAGNOSIS — M48061 Spinal stenosis, lumbar region without neurogenic claudication: Secondary | ICD-10-CM | POA: Diagnosis not present

## 2018-07-09 DIAGNOSIS — F410 Panic disorder [episodic paroxysmal anxiety] without agoraphobia: Secondary | ICD-10-CM

## 2018-07-09 DIAGNOSIS — F4310 Post-traumatic stress disorder, unspecified: Secondary | ICD-10-CM

## 2018-07-09 DIAGNOSIS — F411 Generalized anxiety disorder: Principal | ICD-10-CM

## 2018-07-09 DIAGNOSIS — F331 Major depressive disorder, recurrent, moderate: Secondary | ICD-10-CM

## 2018-07-09 NOTE — Unmapped (Signed)
[facility]   Confidential Psychological Therapy Session      Patient Name: ARLYSS WEATHERSBY  Medical Record Number: [id number]  Date of Service: July 09, 2018  Attending Psychologist: Caroline More, PhD  CPT Procedure Codes: 56213 for 60 mins of face to face counseling    REFERRING PHYSICIAN: Clarene Essex, MD    CHIEF COMPLAINT AND REASON FOR VISIT: pain coping skills, CBT to address depression and anxiety in the setting of chronic pain    SUBJECTIVE / HISTORY OF PRESENT ILLNESS: Ms.  Aure is a very pleasant 62 y.o.  female from [place], [place] with multiple chronic pain complaints related to fibromyalgia and lupus who initially met with me in October 2016, and which time she was diagnosed with severe depression, PTSD, panic disorder, and generalized anxiety.The patient returns for a therapy session today. Last follow up with me was in 05/2018.  Patient returns with full bright affect noting that her depression is much improved.  She is feeling healthier.  She is off steroids completely.  She is off her inhalers.  She is still working on the long-term plan to get off of as many medications as possible.  She has been doing this for the last year and a half.  She noted she is trying to reduce polypharmacy in general and has been amazed with how many side effects she was not aware she was experiencing.  She plans to stay on psychiatric medications as she feels as they are pertinent to her health and mental health.  Discusses how she was so depressed in the past that she was suicidal.  Discusses how she approached her pastor who is also a Veterinary surgeon and he provided judgment related to her having suicidal ideation.  Specifically he called her selfish.  Patient noted how vulnerable she was in that situation and how disturbing and upsetting his response was.  She noted that a therapist in the past also was very judgmental, this was when she did not have ability to play and her therapist made an offer mark.  Today we discussed how she has come so far in her journey addressing depression.  She has put working with respect to behavioral activation.  She is back to walking every day.  Now that her pulmonary condition has improved she has been going to the gym every day for the last 3 weeks.  She feels so positive.  Notes even more positive mood.  States she would like to be a Agricultural consultant, helping others with mental health particularly children and adolescents with suicidal ideation.  I connected her with resources for [place]ami, who has volunteer opportunities in her community.    Spent extra time today addressing problem solving related to other health needs.  She has lupus and may likely experience infection with her tooth.  She cannot afford a crown right now.  Problem solved low cost dental options.  There is a family dental office in [place] who has payment options.  She will also look into [facility] but apparently they did not have payment plan options, but they are likely the lowest cost option.  She identified several possible solutions but was not able to come up with an action plan.  She plans to talk with family members and try to come up with an action plan this week.  She agrees that addressing the dental needs is important to reduce risk of infection in the future.    OBJECTIVE / MENTAL STATUS:  Appearance:   Appears stated age and Clean/Neat   Motor:  No abnormal movements   Speech/Language:   Normal rate, volume, tone, fluency   Mood:  Depressed and Anxious   Affect:  Bright, full   Thought process:  Logical, linear, clear, coherent, goal directed   Thought content:    Denies SI, HI, self harm, delusions, obsessions, paranoid ideation, or ideas of reference   Perceptual disturbances:    Denies auditory and visual hallucinations, behavior not concerning for response to internal stimuli   Orientation:  Oriented to person, place, time, and general circumstances   Attention:  Able to fully attend without fluctuations in consciousness   Concentration:  Able to fully concentrate and attend   Memory:  Immediate, short-term, long-term, and recall grossly intact    Fund of knowledge:   Consistent with level of education and development   Insight:    Fair   Judgment:   Intact   Impulse Control:  Intact     DIAGNOSTIC IMPRESSION:   Post Traumatic Stress Disorder (PTSD)  Generalized Anxiety Disorder (GAD)  Panic Disorder  Major Depressive Disorder, moderate, recurrent  Chronic pain syndrome  Fibromyalgia  Lupus    ASSESSMENT:   Ms.  Ramaswamy is a very pleasant 62 y.o.  female from [place], [place] with multiple chronic pain complaints related to lupus, arthritis, and fibromyalgia. She was previously seen in our clinic from 2012-2014, and reestablished care with Dr. Fayrene Fearing in August 2016. The patient is struggling with depression and anxiety, but is very motivated to participate in intensive, multidisciplinary treatment to address the connection between depression, anxiety and pain. She has a long-standing history of depression and anxiety, and has worked with outpatient psychiatrist and therapist over the years. She is currently established with [facility] psychiatry, and is tapering off of Klonopin.  Depression, anxiety, and panic have all improved.  Has been diagnosed with diabetes, and is managing it well with p.o. medication and dietary changes.  Has lost 25 pounds.  Has increased energy, improved sleep, decreased caffeine intake.      PLAN:   (1) Psychotherapy - Continue CBT. CBT will be used to address PTSD, MDD, Panic D/o, GAD, and chronic pain.     --Behavioral Activation - Pt has noticed worsening mood/depression. Encouraged behavioral activation.  Is doing great, back to going to the gym every day, walking.  --Download and use Insight Timer, guided relaxation app, for nightly practice.    (2) Psychiatry - Pt currently established with [facility].      (3) Nutritionist - saw a nutritionist and found it helpful. Has lost weight, related to nausea. Exercising more too.    (4) Safety - Pt denies any current or recent SI or safety concerns. Knows to call 911 or go to her local ED. Also given [facility].      (5) follow-up January 2 thousand 20

## 2018-07-09 NOTE — Unmapped (Signed)
It was nice to see you.  Today we did the following:    1. Continue gabapentin, Flexeril and Voltaren Gel.    2. Continue pain psychology.    3. Follow up in 3 months.    Thank you for visiting the [facility].      -Remember to bring all your opioid(narcotic) pill bottles/boxes to every clinic visit, in their original containers.      -Because of the high volume of calls we receive and the high demand for our clinical services, we are occupied all day providing care for patients in the clinic. This leaves little time to respond to phone calls, and we are generally unable to discuss patient care advice over the telephone. If you are experiencing a medication side effect or complication, you can call and let us know, but we will typically not make a medication substitution or change over the telephone.      We are generally unable to respond acutely to a flare up of pain, as this is quite common in our patients and needs to be dealt with as part of the long term management plan. Please make an appointment with [place] if you wish to discuss a matter in any detail. Should you still need to call, please do so at [phone].      Please do not call for early medication refills.      For additional information and services provided by our clinic you may visit our Pain Management Clinic website at:   [email or web address]      Thank you for choosing [facility]. It was a pleasure to see you in clinic today. Please contact us with any questions or concerns at [phone].       Thank you,   Al Corpus, PharmD, CPP

## 2018-07-10 ENCOUNTER — Other Ambulatory Visit: Payer: Self-pay | Admitting: Family Medicine

## 2018-07-10 DIAGNOSIS — M26621 Arthralgia of right temporomandibular joint: Secondary | ICD-10-CM

## 2018-07-10 DIAGNOSIS — R110 Nausea: Secondary | ICD-10-CM

## 2018-07-10 DIAGNOSIS — S161XXA Strain of muscle, fascia and tendon at neck level, initial encounter: Secondary | ICD-10-CM

## 2018-07-10 NOTE — Unmapped (Signed)
Last appointment 07/09/18    Future appointment 10/08/2018    Per [facility] pharmacy prescription last written by Dr Fayrene Fearing in 11/20/17    Please fill if appropriate

## 2018-07-11 MED ORDER — PREDNISONE 2.5 MG TABLET
ORAL_TABLET | 0 refills | 0 days | Status: CP
Start: 2018-07-11 — End: 2018-09-04

## 2018-07-11 NOTE — Unmapped (Signed)
Received request for refill on this medication.

## 2018-07-12 ENCOUNTER — Ambulatory Visit: Payer: Medicare Other | Admitting: Nurse Practitioner

## 2018-07-12 NOTE — Unmapped (Signed)
I was the supervising physician in the delivery of the service.

## 2018-07-20 NOTE — Unmapped (Signed)
Dermatology Note    A/P:     Inverse psoriasis - typical areas involved of gluteal cleft, lower back, right axilla, inframammary skin, umbilicus, infrapannus - improved with topicals    - Would continue try to avoid prednisone in the future if possible as this can flare psoriasis.   - Will continue to treat with topicals below. Discussed current use of cellcept is clearly not helping her psoriasis. If needed, would message pulmonologist and rheumatologist if an alternative agent is needed.   - Continue as needed few times weekly clobetasol (TEMOVATE) 0.05 % ointment for severe flares   - START triamcinolone (KENALOG) 0.1 % ointment; Apply to rash when itchy as needed, few times per week  Dispense: 80 g; Refill: 2  -For maintenance, START calcipotriene (DOVONOX) 0.005 % ointment; Apply every other day to areas of rash in folds as maintenance  Dispense: 60 g; Refill: 2    2. Pseudofolliculitis barbae  - In past, has Nd-Yag laser with Dr. Janyth Contes   - Discussed use of OTC nair products for few stubborn areas   - No active acne like lesions today, will hold on clindamycin   - Discussed vaniqua, but patient did not pursue 2/2 cost       Return in about 3 months (around 10/27/2018) for jan 27 or feb 21 with Jeramy Dimmick cc .        CC:  Follow-up rash, new rash      HPI:  Caitlin Smith is a friendly 62 y.o. year old female seen today in follow-up by Dr. Juanita Laster for inverse psoriasis. At [facility] 6 weeks ago, we prescribed clobetasol ointment to use BID to this rash involving the gluteal cleft, right under arm, back, umbilicus, under breasts and abdominal fold. She is maintained on 2.5mg  prednisone and plaquenil for SLE and eosinophilic pneumonitis. Today, she reports significant improvement and is now using clobetasol a few times per week. She would like to stick with topicals.     She also has a new concern regarding bumps on the chin area. She has had this for many years prior and it is less problematic than before. She used to receive Nd-Yag laser sessions with Dr. Janyth Contes. She does not want to travel to [facility] at this time, but asks about what she can topically use on the hairs.     No other lesions that were painful, bleeding, growing or concerning.     Pertinent PMH:   No history of skin cancer   SLE   Eosinophilic pneumonitis   Inverse Psoriasis     FH:   No family history of melanoma     SH:   Lives in [place]     ROS:  + diffuse joint pains. All other systems reviewed are negative.     PE:  General: Friendly and conversational female in no distress, resting comfortably.  Neuro: Alert and oriented x 3, answering questions appropriately.  Skin: Inspection and palpation of the head, neck, chest, abdomen, back, bilateral upper extremities, bilateral lower extremities, genitals was performed and notable for the following:  1. Few hypopigmented patches of b/l axilla and inframammary skin. No plaques, scaling or erythema today.   2. Chin and upper lip with terminal hairs, few scars around follicles     All other areas examined were normal or had no significant findings.   ______________________________________________________________________    The patient was seen and examined by Dr. Orma Flaming who agrees with the assessment and plan as above.

## 2018-07-23 ENCOUNTER — Ambulatory Visit: Payer: Medicare Other | Admitting: Nurse Practitioner

## 2018-07-27 ENCOUNTER — Encounter: Admit: 2018-07-27 | Discharge: 2018-07-28 | Payer: MEDICARE | Attending: Dermatology | Primary: Dermatology

## 2018-07-27 DIAGNOSIS — L408 Other psoriasis: Secondary | ICD-10-CM | POA: Diagnosis not present

## 2018-07-27 DIAGNOSIS — L731 Pseudofolliculitis barbae: Secondary | ICD-10-CM | POA: Diagnosis not present

## 2018-07-27 MED ORDER — TRIAMCINOLONE ACETONIDE 0.1 % TOPICAL OINTMENT
2 refills | 0 days | Status: CP
Start: 2018-07-27 — End: 2018-10-26

## 2018-07-27 MED ORDER — CALCIPOTRIENE 0.005 % TOPICAL OINTMENT
2 refills | 0 days | Status: CP
Start: 2018-07-27 — End: ?

## 2018-07-27 NOTE — Unmapped (Signed)
Hello, It was nice seeing you today.     Let's try the following plan:   - Try the triamcinolone ointment (not as strong as clobetasol, safer for folds) when needed for itchy rash   - Try the calcipotriene ointment (safe, NOT a steroid) every other day as maintenance to keep it away in areas you get the rash     - Try OTC Darene Lamer depilatories for the hairs around the chin and neck, try a test spot first. If you would like to try laser hair removal again in the future, let us know but that is at [facility].     If you have any questions, please let us know.

## 2018-08-01 NOTE — Unmapped (Signed)
Called patient to check-in regarding Wellbutrin taper.  Mailbox was full- unable to leave a VM.  Will see in clinic: 12/10.

## 2018-08-16 NOTE — Unmapped (Signed)
Called Pt to schedule mammo appointment. No VM could be left

## 2018-08-17 NOTE — Unmapped (Signed)
[facility]: Rheumatology Clinic Assessment and Refill Call    Specialty Medication(s): Myfortic  Indication(s): SLE     Caitlin Smith, DOB: 1955-11-10  Above HIPAA information was verified with patient.      Medications reviewed & verified: Allergies - Medications -      Specialty medication(s) & dose(s) confirmed: yes  Changes to medications: no  Tolerating medications:   Adverse Effects        *All other systems reviewed and are negative        Amount of medications patient has on hand:  Not sure (patient not at home)     CLINICAL ASSESSMENT     Caitlin Smith reports tolerating Myfortic well without adverse effects.  Adherence to therapy confirmed with patient and refill record @ [facility].  Patient reports missing ~1 dose each week due to forgetfulness. Clinically, SLE is well controlled.  Inverse psoriasis flared recently and went for dermatology visit late 07/27/18 (see notes for details). Derm noted that Myfortic is not helping with psoriasis and consider possibility of changing therapy in the future.  Patient will be seen in clinic next month for evaluation.    Does Caitlin Smith have follow up appointment scheduled with clinic? Yes, appointment is scheduled and patient is aware    SHIPPING     ?? Shipping address verified with patient and Epic/WAM.  Expected medication delivery date: 08/21/18, via UPS.    ?? Other medications/items to be shipping:  n/a    The patient will receive a print out for each medication shipped and additional FDA Medication Guides as required.  Patient education from [place] or Robet Leu may also be included in the shipment    All questions were answered and contact information provided for any future questions/concerns.      Jeneen Montgomery

## 2018-08-20 MED FILL — MYCOPHENOLATE SODIUM 360 MG TABLET,DELAYED RELEASE: 90 days supply | Qty: 180 | Fill #2 | Status: AC

## 2018-08-20 MED FILL — MYCOPHENOLATE SODIUM 360 MG TABLET,DELAYED RELEASE: ORAL | 90 days supply | Qty: 180 | Fill #2

## 2018-08-21 NOTE — Unmapped (Signed)
Rescheduling refill call  Delivery went out on 12/16 to get to her on 12/17    Sent message to CPP to put encounter in profile.  SSC received message from [place] through pool on 12/13 to send out to her.    Caitlin Smith

## 2018-09-04 ENCOUNTER — Encounter
Admit: 2018-09-04 | Discharge: 2018-09-05 | Payer: MEDICARE | Attending: Student in an Organized Health Care Education/Training Program | Primary: Student in an Organized Health Care Education/Training Program

## 2018-09-04 DIAGNOSIS — F411 Generalized anxiety disorder: Secondary | ICD-10-CM

## 2018-09-04 DIAGNOSIS — F329 Major depressive disorder, single episode, unspecified: Principal | ICD-10-CM

## 2018-09-04 MED ORDER — BUPROPION HCL XL 150 MG 24 HR TABLET, EXTENDED RELEASE
ORAL_TABLET | Freq: Every day | ORAL | 0 refills | 0.00000 days | Status: CP
Start: 2018-09-04 — End: 2018-11-06

## 2018-09-04 MED ORDER — DULOXETINE 60 MG CAPSULE,DELAYED RELEASE
ORAL_CAPSULE | Freq: Every day | ORAL | 2 refills | 0.00000 days | Status: CP
Start: 2018-09-04 — End: 2018-11-06

## 2018-09-04 MED ORDER — LAMOTRIGINE 200 MG TABLET
ORAL_TABLET | Freq: Two times a day (BID) | ORAL | 2 refills | 0.00000 days | Status: CP
Start: 2018-09-04 — End: 2018-11-06

## 2018-09-04 MED ORDER — TRAZODONE 100 MG TABLET
ORAL_TABLET | Freq: Every evening | ORAL | 2 refills | 0.00000 days | Status: CP
Start: 2018-09-04 — End: 2018-11-06

## 2018-09-04 MED ORDER — CLONAZEPAM 0.5 MG TABLET
ORAL_TABLET | Freq: Every evening | ORAL | 2 refills | 0.00000 days | Status: CP
Start: 2018-09-04 — End: 2018-11-27

## 2018-09-04 NOTE — Unmapped (Addendum)
You can start taking Over-the-counter melatonin 3mg  or 5mg  around 6pm-8pm. This may help with sleep    Please try to limit nighttime screentime as this can make it more difficulty to fall asleep    Please decrease your wellbutrin to 150mg . If you notice any worsening in your mood or anxiety, please call me and we will increase back to 300mg     Please call me at [phone] with any questions/concerns prior to next appt      Follow-up instructions:  -- Please continue taking your medications as prescribed for your mental health.   -- Do not make changes to your medications, including taking more or less than prescribed, unless under the supervision of your physician. Be aware that some medications may make you feel worse if abruptly stopped  -- Please refrain from using illicit substances, as these can affect your mood and could cause anxiety or other concerning symptoms.   -- Seek further medical care for any increase in symptoms or new symptoms such as thoughts of wanting to hurt yourself or hurt others.     Contact info:  Life-threatening emergencies: call 911 or go to the nearest ER for medical or psychiatric attention.     Issues that need urgent attention but are not life threatening: call the clinic outpatient frontdesk at [phone] for assistance.     Non-urgent routine concerns, questions, and refill requests: please leave me a voicemail at [phone] and I will get back to you within 2 business days.     Regarding appointments:  - If you need to cancel your appointment, we ask that you call [phone] at least 24 hours before your scheduled appointment.  - If for any reason you arrive 15 minutes later than your scheduled appointment time, you may not be seen and your visit may be rescheduled.  - Please remember that we will not automatically reschedule missed appointments.  - If you miss two (2) appointments without letting us know in advance, you will likely be referred to a provider in your community.  - We will do our best to be on time. Sometimes an emergency will arise that might cause your clinician to be late. We will try to inform you of this when you check in for your appointment. If you wait more than 15 minutes past your appointment time without such notice, please speak with the front desk staff.    In the event of bad weather, the clinic staff will attempt to contact you, should your appointment need to be rescheduled. Additionally, you can call the Patient Weather Line [phone] for system-wide clinic status    For more information and reminders regarding clinic policies (these were provided when you were admitted to the clinic), please ask the front desk.

## 2018-09-04 NOTE — Unmapped (Addendum)
[facility]  Psychiatry   Established Patient E&M Service     Assessment:  Caitlin Smith is a 62 y.o. female with a history of MDD, GAD, and Panic Disorder in the context of many chronic medical conditions including chronic pain/fibromyalgia, osteoarthritis with degenerative disc disease, Lupus, pituitary tumor (benign), s/p right total knee arthroplasty, and VIN II (s/p resection), who presents for follow-up evaluation. She has been maintained on Lamictal for many years as well as cymbalta, trazodone, klonopin, and wellbutrin. Patient has previously tolerated a slight taper in her klonopin from 0.75 mg to 0.25 mg qHS overtime. Her mood is often exacerbated by steroid use for current treatment of pulmonary disease. Patient is engaged with CBT therapy at the pain clinic.    Today, 09/04/18, patient presents with stable mood and affect, but continues to report memory difficulties as well as insomnia and some generalized anxiety. Wellbutrin was decreased at last visit on recommendation of [facility] to help simplify psychopharm regimen given dx of mild cognitive impairment. Patient has not noticed any worsening in mood/anxiety since decreasing Wellbutrin, but also has not noticed any improvement in sleep or cognition. Discussed with patient the possibility of trying to continue tapering/discontinuing medications due to concern for polypharmacy. Patient agreeable to continue slow taper of wellbutrin, with understanding that she should call with any worsening of mood/anxiety as this would be indication to go back up on dose. Patient became tearful when discussing Klonopin- fears that her anxiety would go to the [place] if it was discontinued. While benzodiazepines carry long-term risk of worsening cognitive function, 0.25mg  is a small dose and any attempt to discontinue would be a risks/benefits discussion. RTC in 4-6 weeks.    Risk Assessment:  A suicide and violence risk assessment was performed as part of this evaluation. There patient is deemed to be at chronic elevated risk for self-harm/suicide given the following factors: divorced, recent onset of serious medical condition, current diagnosis of depression, chronic severe medical condition and chronic mental illness > 5 years. The patient is deemed to be at chronic elevated risk for violence given the following factors: N/A. These risk factors are mitigated by the following factors:lack of active SI/HI, no know access to weapons or firearms, no history of violence, motivation for treatment, utilization of positive coping skills, supportive family, sense of responsibility to family and social supports, religious or spiritual prohibition to suicide/violence, current treatment compliance, safe housing and presence of a safety plan with follow-up care. There is no acute risk for suicide or violence at this time. The patient was educated about relevant modifiable risk factors including following recommendations for treatment of psychiatric illness and abstaining from substance abuse.    While future psychiatric events cannot be accurately predicted, the patient does not currently require acute inpatient psychiatric care and does not currently meet [facility] involuntary commitment criteria.      Stressors: chronic medical illness including chronic pain, lupus, physical limitations     Plan:  # Depression - Anxiety - Panic Disorder  - Continue Lamictal 400mg  qhs for mood  - Continue Cymbalta 120mg  qAM to target depressive sx and chronic pain  - DECREASE Wellbutrin XL to 150mg  qAM (dec 10/8, 12/31 ) given polypharmacy and insomnia. Patient to call if she notices any worsening in mood or anxiety which would be indication to increase back to 300mg . May consider discontinuing at next visit.   - Continue Clonazepam 0.25mg  qHS, appropriate filling per NCCSRS  - Continue  CBT at pain clinic    # Insomnia: Last sleep study performed in 2017. Per chart review, showed mild OSA likely related to medication use. Neurology recommends patient avoid sleeping on back as she likely has OSA when sleeping in this position  - Continue Trazodone 200mg  qHS for insomnia. May plan to discontinue in future if insomnia improves after discontinuation of Wellbutrin and initiation of melatonin.  - START OTC melatonin q1800  - Taper Wellbutrin per above  - Clonazepam per above  - Patient provided with sleep hygiene worksheet, discussed limiting screentime at bedtime and caffeine intake during the day    # Mild Neurocognitive Impairment: Patient seen by Neurology ([facility] Disorders Clinic) 05/22/18 who dx pt with mild neurocognitive impairment w/ deficits in inattention and visual-spatial function with suspicion that sleep apnea and polypharmacy may be contributing to her difficulties. Recommended that psychopharm regimen be simplified, including eliminating trazodone and considering changing Wellbutrin to Zoloft or Lexapro for anxiety and eliminating Klonopin when stable.  - Plan for gradual taper of Wellbutrin per above  - Will consider taper/discontinuation of Trazodone vs Klonopin next if sleep improves after above changes  - However, it is very likely that patient's lupus is significantly contributing to cognitive decline, which will limit improvement she will see with reduction in polypharmacy  ??  # Medical Monitoring - High Risk Medication Use  - The complexity of this patient's care involves drug therapy which requires intensive monitoring for toxicity. This is in part due to a narrow therapeutic window, as well as potential for toxicity. This patient is being treated with Lamotrigine (Lamictal) to target their psychiatric disorder, Major depressive disorder, refractory. In regards to this medication being considered high risk, Lamotrigine (lamictal) has black box warning for serious rash including fatal reaction of Stevens-Johnson syndrome and rare cases of toxic epidermal necrolysis. In addition to prolonged titration to mitigate risk of serious rash, lamotrigine requires monitoring of hepatic and renal function at baseline, and at times will require monitoring of lamotrigine blood levels.Marland Kitchen  LFTs   Lab Results   Component Value Date    AST 29 05/22/2018    AST 30 05/01/2018    AST 26 01/31/2018    AST 32 11/05/2014    AST 30 05/08/2014    AST 30 01/23/2014      Lab Results   Component Value Date    ALT 28 05/22/2018    ALT 28 05/01/2018    ALT 24 01/31/2018    ALT 21 11/05/2014    ALT 31 05/08/2014    ALT 30 01/23/2014   Platelets    Lab Results   Component Value Date    Platelet 353 05/22/2018    Platelet 430 05/01/2018    Platelet 377 01/31/2018    Platelet 304 11/05/2014    Platelet 347 05/08/2014    Platelet 484 (H) 01/23/2014   Lamotrigine Monitoring  - Hepatic function testing, platelets (via CBC) q6 months   - Last obtained 05/2018   - Due 11/2018    # Return to Clinic: Follow-up in Psychosomatic clinic in 4-6 weeks(s). Resident transition discussed. Patient states preference for female provider- will be scheduled with Dr. Dayton Scrape.     Psychotherapy:  No billable psychotherapy service provided but brief supportive therapy was utilized.    Patient has been given this writer's contact information as well as the [facility] Psychiatry urgent line number. The patient has been instructed to call 911 for emergencies.    Patient was seen and plan  of care was discussed with the Attending MD, Dr. Elita Boone, who agrees with the above statement and plan.     Angelyn Punt, MD      Subjective:     Psychiatric Chief Concern:  Follow-up psychiatric evaluation for MDD, GAD    Interval History:  Patient presents to appointment on time and unaccompanied. Wellbutrin was decreased at last visit in October to 300mg . She says when dose was first decreased, she noticed worsening in anxiety/mood which then leveled out.  Over the last few weeks, patient has had some anxiety which she feel is mostly related to the holidays. No panic attacks. Patient reports continued difficulty with memory- forgetting where her car is in her parking lot, walking into a room in her house and then forgeting why, etc. Sleep remains poor- takes her about 2 hours to fall asleep and has several nighttime awakenings, but is generally able to fall asleep again easily. She did not try melatonin after last visit (forgot about it). Has not noticed any improvement in sleep or memory since decreasing Wellbutrin. Patient continues to exercise regularly (walks on the treadmill at her gym) and eats lots of fruits/vegetables (patinet has lost 9 lbs since last visit in October). Her BP has been trending downward as well (108/64 today in clinic) and she has noted some episodes of lightheadedness/dizziness when standing. Patient has appt with her PCP within the next week and will discuss possibility of discontinuing her antihypertensive.       NCCSRS: Reviewed; Appropriate filling. Since last visit:     Fill Date ID Written Drug Qty Days Prescriber Rx # Pharmacy Refill Daily Dose * Pymt Type PMP   08/22/2018  1   06/12/2018  Clonazepam 0.5 MG Tablet  15.00 30 Ga Gal  [id number]   [facility] ([id number])  2  0.50 LME  Comm Ins  [place]   07/23/2018  1   06/12/2018  Clonazepam 0.5 MG Tablet  15.00 30 Ga Gal  [id number]   [facility] ([id number])  1  0.50 LME  Comm Ins  [place]   06/21/2018  1   06/12/2018  Clonazepam 0.5 MG Tablet  15.00 30 Ga Gal  [id number]   [facility] ([id number])  0  0.50 LME  Medicare  [place]     Social History: reviewed; pertinents have been documented in the interval history section.  She has a son who lives in [place] and a daughter in [place] who are both supportive. +several grandchildren    ROS:  As per Interval History and:  Constitutional: +memory deficits  Neuro: +lightheadedness/dizziness on standing      Objective:    Mental Status Exam:  Appearance:    Appears stated age, Well nourished and Clean/Neat   Motor:   No abnormal movements   Speech/Language:    Normal rate, volume, tone, fluency and Language intact, well formed   Mood:  alright, some anxiety   Affect:   Calm, Cooperative and Euthymic   Thought process and Associations:   Logical, linear, clear, coherent, goal directed   Abnormal/psychotic thought content:     Denies SI, HI, self harm, delusions, obsessions, paranoid ideation, or ideas of reference    Perceptual disturbances:     Denies auditory and visual hallucinations, behavior not concerning for response to internal stimuli     Orientation:   Oriented to person, place, time, and general circumstances   Attention and Concentration:   Able to fully concentrate and attend   Memory:   Immediate, short-term, long-term,  and recall grossly intact    Fund of knowledge:    Consistent with level of education and development   Insight:     Fair   Judgment:    Fair   Impulse Control:   Fair       Medications: reviewed at today's visit        Fill Date ID Written Drug Qty Days Prescriber Rx # Pharmacy Refill Daily Dose * Pymt Type PMP   08/22/2018  1   06/12/2018  Clonazepam 0.5 MG Tablet  15.00 30 Ga Gal  [id number]   [facility] ([id number])  2  0.50 LME  Comm Ins  [place]   07/23/2018  1   06/12/2018  Clonazepam 0.5 MG Tablet  15.00 30 Ga Gal  [id number]   [facility] ([id number])  1  0.50 LME  Comm Ins  [place]   06/21/2018  1   06/12/2018  Clonazepam 0.5 MG Tablet  15.00 30 Ga Gal  [id number]   [facility] ([id number])  0  0.50 LME  Medicare  [place]         Vitals:   Vitals:    09/04/18 0826   BP: 108/64     Vitals:    09/04/18 0826   Weight: 90.5 kg (199 lb 8.3 oz)       PE:   Vital signs were reviewed.      General: No acute distress  Pulm: Non-labored respirations  Neuro: Spontaneously moving all 4 limbs without obvious deficits.  Gait and station assessed with no abnormalities    Psychometrics:  MOCA 22/30 on 12/12/17  Missed points for: Visuospatial (unable to complete pattern or copy cube), Serial 7's (able to only get 93 and 86), Language (difficulty with naming words that begin with F), Abstraction (unable to state how watch and ruler similar), and Delayed recall (2/5 on first attempt but got all 5 after category clue was given)    Angelyn Punt, MD  09/04/2018

## 2018-09-06 ENCOUNTER — Ambulatory Visit (INDEPENDENT_AMBULATORY_CARE_PROVIDER_SITE_OTHER): Payer: Medicare Other | Admitting: Nurse Practitioner

## 2018-09-06 ENCOUNTER — Encounter: Payer: Self-pay | Admitting: Nurse Practitioner

## 2018-09-06 ENCOUNTER — Telehealth: Payer: Self-pay

## 2018-09-06 VITALS — BP 106/70 | HR 93 | Temp 98.7°F | Resp 16 | Ht 68.0 in | Wt 198.9 lb

## 2018-09-06 DIAGNOSIS — R110 Nausea: Secondary | ICD-10-CM

## 2018-09-06 DIAGNOSIS — D352 Benign neoplasm of pituitary gland: Secondary | ICD-10-CM | POA: Diagnosis not present

## 2018-09-06 DIAGNOSIS — G44201 Tension-type headache, unspecified, intractable: Secondary | ICD-10-CM | POA: Diagnosis not present

## 2018-09-06 DIAGNOSIS — H905 Unspecified sensorineural hearing loss: Secondary | ICD-10-CM | POA: Diagnosis not present

## 2018-09-06 DIAGNOSIS — R42 Dizziness and giddiness: Secondary | ICD-10-CM | POA: Diagnosis not present

## 2018-09-06 DIAGNOSIS — R251 Tremor, unspecified: Secondary | ICD-10-CM

## 2018-09-06 DIAGNOSIS — D353 Benign neoplasm of craniopharyngeal duct: Secondary | ICD-10-CM

## 2018-09-06 MED ORDER — MECLIZINE HCL 25 MG PO TABS: 25.0000 mg | ORAL_TABLET | Freq: Three times a day (TID) | ORAL | 0 refills | Status: DC | PRN

## 2018-09-06 NOTE — Telephone Encounter (Signed)
Pt needs STAT MRI of brain, NP elizabeth states can be done within next 2 days, please call patient

## 2018-09-06 NOTE — Progress Notes (Signed)
Name: Nicole Ballard   MRN: [id number]    DOB: 1955-10-26   Date:09/06/2018       Progress Note  Subjective  Chief Complaint  Chief Complaint  Patient presents with  . Headache    Right side of head, onset 2 weeks ago    HPI  Patient endorses general right sided facial pressure that intermittently gets worse and feels like tight band is on that side of the face. Took tylenol without relief of symptoms. States when she turns her head she can feel the pressure in her face and right side of head to ear. Patient endorses intermittent dizziness- states has vertigo but didn't think about taking her vertigo medicine at the time. She also endorses fatigue for the last 2 weeks. Patient has benign pituitary tumor, complete hearing loss in right ear- MRI completed in 2018 showed no changes. Patient has tremor in bilateral upper extremities- states has had in the past had resolved for the past months but has recently returned.   Denies fevers, chills, paresthesias, unilateral weakness, slurred speech, vision changes, not aggravated by activity, no photophobia/phonophobia, no worsening in chronic nausea, no nasal congestion.    Patient Active Problem List   Diagnosis Date Noted  . Hyperglycemia 10/23/2017  . Vertigo 09/29/2016  . Bronchiolitis obliterans organizing pneumonia ([place]) 09/29/2016  . Neck pain, musculoskeletal 12/15/2015  . Anxiety about health 09/29/2015  . Abnormal CAT scan 04/11/2015  . Auditory impairment 04/11/2015  . Insomnia, persistent 04/11/2015  . Chronic kidney disease (CKD), stage I 04/11/2015  . Chronic nonmalignant pain 04/11/2015  . Chronic constipation 04/11/2015  . Dyslipidemia 04/11/2015  . Fibromyalgia 04/11/2015  . Gastro-esophageal reflux disease without esophagitis 04/11/2015  . Dysphonia 04/11/2015  . Absence of bladder continence 04/11/2015  . Low back pain 04/11/2015  . Eczema intertrigo 04/11/2015  . Keratosis pilaris 04/11/2015  . Chronic recurrent  major depressive disorder ([place]) 04/11/2015  . Dysmetabolic syndrome 93/81/0175  . Neurogenic claudication 04/11/2015  . Perennial allergic rhinitis with seasonal variation 04/11/2015  . Abnormal presence of protein in urine 04/11/2015  . Seborrhea capitis 04/11/2015  . Moderate dysplasia of vulva 04/11/2015  . Vitamin D deficiency 04/11/2015  . Treadmill stress test negative for angina pectoris 03/05/2015  . Shortness of breath 05/20/2014  . Morbid obesity ([place]) 02/03/2014  . Asthma, chronic 12/31/2013  . H/O total knee replacement 12/31/2013  . Episcleritis 09/26/2013  . Vocal cord dysfunction 05/28/2013  . Anxiety, generalized 03/19/2013  . Back pain 12/18/2012  . Pulmonary nodules 11/26/2012  . Lung nodule, multiple 11/26/2012  . Arthritis, degenerative 11/01/2012  . H/O aspiration pneumonitis 10/22/2012  . Postinflammatory pulmonary fibrosis ([place]) 10/22/2012  . Mixed incontinence 05/04/2012  . Benign neoplasm of pituitary gland and craniopharyngeal duct ([place]) 12/29/2010  . Lung involvement in systemic lupus erythematosus ([place]) 11/05/2010  . Chronic nausea 07/01/2008  . Benign hypertension 01/25/2005    Past Medical History:  Diagnosis Date  . Anxiety   . Bipolar 1 disorder ([place])   . Chronic kidney disease   . Chronic pain   . Diabetes mellitus without complication ([place])   . Fibromyalgia   . GERD (gastroesophageal reflux disease)   . Hearing loss of right ear   . Hemifacial spasm   . Hyperlipidemia   . Hypertension   . Insomnia   . Lumbago   . Osteoarthritis   . Paresthesia   . Rhinitis   . Systemic lupus erythematosus ([place])   . Vitamin D deficiency  Past Surgical History:  Procedure Laterality Date  . BRAIN SURGERY    . CHOLECYSTECTOMY    . VAGINAL HYSTERECTOMY      Social History   Tobacco Use  . Smoking status: Never Smoker  . Smokeless tobacco: Never Used  Substance Use Topics  . Alcohol use: No    Alcohol/week: 0.0 standard drinks      Current Outpatient Medications:  .  aspirin EC 81 MG tablet, Take 1 tablet (81 mg total) by mouth daily., Disp: 30 tablet, Rfl: 0 .  atorvastatin (LIPITOR) 40 MG tablet, TAKE 1 TABLET BY MOUTH  DAILY, Disp: 90 tablet, Rfl: 1 .  buPROPion (WELLBUTRIN XL) 150 MG 24 hr tablet, Take 1 tablet by mouth daily. , Disp: , Rfl:  .  clobetasol ointment (TEMOVATE) 0.05 %, Apply topically., Disp: , Rfl:  .  clonazePAM (KLONOPIN) 0.5 MG tablet, Take 0.5 tablets (0.25 mg total) by mouth daily., Disp: 15 tablet, Rfl: 0 .  cyclobenzaprine (FLEXERIL) 5 MG tablet, TAKE 1 TABLET BY MOUTH  EVERY 8 HOURS AS NEEDED FOR MUSCLE SPASMS. DO NOT DRIVE FOR 8 HOURS, Disp: 90 tablet, Rfl: 1 .  diclofenac sodium (VOLTAREN) 1 % GEL, APP 2 GRAMS EXT AA QID, Disp: , Rfl: 6 .  DULoxetine (CYMBALTA) 60 MG capsule, Take 120 mg by mouth at bedtime. , Disp: , Rfl:  .  fluticasone (FLONASE) 50 MCG/ACT nasal spray, USE 2 SPRAYS IN EACH  NOSTRIL DAILY, Disp: 48 g, Rfl: 1 .  gabapentin (NEURONTIN) 300 MG capsule, Take 1 capsule by mouth at bedtime., Disp: , Rfl:  .  hydroxychloroquine (PLAQUENIL) 200 MG tablet, Take 200 mg by mouth 2 (two) times daily., Disp: , Rfl:  .  ketoconazole (NIZORAL) 2 % cream, Apply 1 application topically daily., Disp: 15 g, Rfl: 0 .  lamoTRIgine (LAMICTAL) 200 MG tablet, Take 1 tablet (200 mg total) by mouth 2 (two) times daily., Disp: 60 tablet, Rfl: 0 .  meclizine (ANTIVERT) 25 MG tablet, Take 1 tablet (25 mg total) by mouth 3 (three) times daily as needed., Disp: 30 tablet, Rfl: 0 .  metFORMIN (GLUCOPHAGE-XR) 500 MG 24 hr tablet, TAKE 1 TABLET(500 MG) BY MOUTH DAILY WITH BREAKFAST, Disp: 90 tablet, Rfl: 1 .  mycophenolate (MYFORTIC) 360 MG TBEC EC tablet, Take by mouth., Disp: , Rfl:  .  omeprazole (PRILOSEC) 40 MG capsule, TAKE 1 CAPSULE BY MOUTH  DAILY, Disp: 90 capsule, Rfl: 0 .  ondansetron (ZOFRAN) 4 MG tablet, TAKE 1 TABLET BY MOUTH  EVERY 8 HOURS AS NEEDED FOR NAUSEA AND VOMITING, Disp: 60  tablet, Rfl: 0 .  prednisoLONE acetate (PRED FORTE) 1 % ophthalmic suspension, Place 1 drop into both eyes 2 (two) times daily as needed., Disp: , Rfl: 0 .  telmisartan-hydrochlorothiazide (MICARDIS HCT) 80-25 MG tablet, Take 1 tablet by mouth daily., Disp: 90 tablet, Rfl: 1 .  traZODone (DESYREL) 100 MG tablet, Take 1 tablet by mouth at bedtime., Disp: , Rfl:  .  triamcinolone ointment (KENALOG) 0.1 %, , Disp: , Rfl:  .  Cholecalciferol (VITAMIN D) 2000 UNITS CAPS, Take 1 capsule by mouth daily., Disp: , Rfl:  .  docusate sodium (COLACE) 100 MG capsule, Take by mouth., Disp: , Rfl:  .  FLOVENT HFA 110 MCG/ACT inhaler, INHALE 2 PUFFS PO BID, Disp: , Rfl: 0 .  Multiple Vitamin (MULTIVITAMIN) tablet, Take 1 tablet by mouth daily., Disp: , Rfl:  .  olopatadine (PATANOL) 0.1 % ophthalmic solution, Place 1 drop into both eyes  2 (two) times daily., Disp: , Rfl:  .  YUVAFEM 10 MCG TABS vaginal tablet, Place 1 tablet vaginally once a week., Disp: , Rfl:   Allergies  Allergen Reactions  . Ace Inhibitors     Other reaction(s): OTHER Pt states she can not take ace inhibitors. Pt states she can not remember her reaction.   Marland Kitchen Penicillins     ROS   No other specific complaints in a complete review of systems (except as listed in HPI above).  Objective  Vitals:   09/06/18 0841  BP: 106/70  Pulse: 93  Resp: 16  Temp: 98.7 F (37.1 C)  SpO2: 98%  Weight: 198 lb 14.4 oz (90.2 kg)  Height: 5\' 8"  (1.727 m)    Body mass index is 30.24 kg/m.  Nursing Note and Vital Signs reviewed.  Physical Exam Vitals signs reviewed.  Constitutional:      Appearance: She is well-developed.  HENT:     Head: Normocephalic and atraumatic.     Right Ear: No decreased hearing noted.  Eyes:     General: No visual field deficit. Neck:     Musculoskeletal: Normal range of motion and neck supple.     Vascular: No carotid bruit.  Cardiovascular:     Heart sounds: Normal heart sounds.     Comments: No  temporal tenderness Pulmonary:     Effort: Pulmonary effort is normal.     Breath sounds: Normal breath sounds.  Abdominal:     General: Bowel sounds are normal.     Palpations: Abdomen is soft.     Tenderness: There is no abdominal tenderness.  Musculoskeletal: Normal range of motion.  Skin:    General: Skin is warm and dry.     Capillary Refill: Capillary refill takes less than 2 seconds.  Neurological:     Mental Status: She is alert and oriented to person, place, and time.     GCS: GCS eye subscore is 4. GCS verbal subscore is 5. GCS motor subscore is 6.     Cranial Nerves: No cranial nerve deficit, dysarthria or facial asymmetry.     Sensory: No sensory deficit.     Motor: Tremor (mild upper extremities ) present. No weakness.     Coordination: Romberg sign negative. Coordination normal. Finger-Nose-Finger Test normal.     Gait: Tandem walk abnormal. Gait normal.  Psychiatric:        Speech: Speech normal.        Behavior: Behavior normal.        Thought Content: Thought content normal.        Judgment: Judgment normal.       No results found for this or any previous visit (from the past 48 hour(s)).  Assessment & Plan  63 year old female presents to the office for 2 weeks of right sided band like headache and facial pain and pressure.  She endorses intermittent dizziness, increased awareness of pain with neck movements, states does not make pain worse however.  Was not relieved by Tylenol.  She has fullness on right side of face but no nasal congestion or tenderness.  Physical exam revealed upper extremity tremors, abnormal tandem walking, hearing loss in right ear.  No other neuro deficits noted.  Patient has been seeing neurologist for routine MRI, multiple stable MRIs in the past years was released in 2018.  Due to patient's history and new onset of headaches, imaging ordered.  Consider tension headache, sinus headache, mass lesion ect. Follow up in 5  days or sooner if  needed- discussed referral to neurology- will see with imaging and medication. See AVS  1. Acute intractable tension-type headache - MR Brain W Wo Contrast; Future  2. Benign neoplasm of pituitary gland and craniopharyngeal duct (HCC) - MR Brain W Wo Contrast; Future  3. Sensorineural hearing loss (SNHL) of right ear, unspecified hearing status on contralateral side - MR Brain W Wo Contrast; Future  4. Chronic nausea - MR Brain W Wo Contrast; Future - meclizine (ANTIVERT) 25 MG tablet; Take 1 tablet (25 mg total) by mouth 3 (three) times daily as needed.  Dispense: 30 tablet; Refill: 0  5. Vertigo - MR Brain W Wo Contrast; Future - meclizine (ANTIVERT) 25 MG tablet; Take 1 tablet (25 mg total) by mouth 3 (three) times daily as needed.  Dispense: 30 tablet; Refill: 0  6. Tremor - MR Brain W Wo Contrast; Future   -Red flags and when to present for emergency care or RTC including fever >101.56F, vision changes, unilateral weakness, severe headache, paresthesia, speech changes, new/worsening/un-resolving symptoms,  reviewed with patient at time of visit. Follow up and care instructions discussed and provided in AVS.

## 2018-09-06 NOTE — Patient Instructions (Addendum)
-   We are ordering and MRI to take a look at your brain due to your history and new onset of headaches. You should receive a phone call soon from our referral coordinator to set that up.  - I would like you to take half a tablet of your micardis (telmisartan 80mg  -hydrocholothiazide 25mg ) and come back in 2 weeks for a blood pressure re-check. We can send you a new prescription after that for the correct dose if your blood pressure is still well controlled.  - For your headaches you can take a flexeril 5mg , acetaminophen 1000mg  (up to 3 times a day, do NOT take more than 3,000mg  in 24 hours period), Use the antivert (meclizine) if having dizziness. Restart taking the flonase for the next week and then as needed.    Warning Signs to go to ER  Warning signs of a stroke The symptoms of stroke may vary and will reflect the part of the brain that is involved. Symptoms usually happen suddenly. "BE FAST" is an easy way to remember the main warning signs of a stroke. B - Balance Signs are dizziness, sudden trouble walking, or loss of balance. E - Eyes Signs are trouble seeing or a sudden change in vision. F - Face Signs are sudden weakness or numbness of the face, or the face or eyelid drooping on one side. A - Arms Signs are weakness or numbness in an arm. This happens suddenly and usually on one side of the body. S - Speech Signs are sudden trouble speaking, slurred speech, or trouble understanding what people say. T - Time Time to call emergency services. Write down what time symptoms started. Other signs of a stroke Some less common signs of a stroke include:  A sudden, severe headache with no known cause.  Nausea or vomiting.  Seizure. A stroke may be happening even if only one "BE FAST" symptoms is present. These symptoms may represent a serious problem that is an emergency. Do not wait to see if the symptoms will go away. Get medical help right away. Call your local emergency services  (911 in the [place] Do not drive yourself to the hospital. Summary  A stroke is a medical emergency and should be treated right away-every second counts.  "BE FAST" is an easy way to remember the main warning signs of a stroke.  Call local emergency services right away if you or someone else has any stroke symptoms, even if the symptoms go away.  Make note of what time the first symptoms appeared. Emergency responders or emergency room staff will need to know this information.  Do not wait to see if symptoms will go away. Call 911 even if only one of the "BE FAST" symptoms appears. This information is not intended to replace advice given to you by your health care provider. Make sure you discuss any questions you have with your health care provider. Document Released: 12/09/2016 Document Revised: 12/09/2016 Document Reviewed: 12/09/2016 Elsevier Interactive Patient Education  2019 [employer].

## 2018-09-10 ENCOUNTER — Other Ambulatory Visit: Payer: Self-pay | Admitting: Family Medicine

## 2018-09-10 ENCOUNTER — Ambulatory Visit
Admission: RE | Admit: 2018-09-10 | Discharge: 2018-09-10 | Disposition: A | Discharge status: Home / Self Care | Payer: Medicare Other | Source: Ambulatory Visit | Attending: Nurse Practitioner | Admitting: Nurse Practitioner

## 2018-09-10 DIAGNOSIS — D353 Benign neoplasm of craniopharyngeal duct: Secondary | ICD-10-CM | POA: Diagnosis not present

## 2018-09-10 DIAGNOSIS — R42 Dizziness and giddiness: Secondary | ICD-10-CM | POA: Diagnosis not present

## 2018-09-10 DIAGNOSIS — R51 Headache: Secondary | ICD-10-CM | POA: Diagnosis not present

## 2018-09-10 DIAGNOSIS — D352 Benign neoplasm of pituitary gland: Secondary | ICD-10-CM | POA: Diagnosis not present

## 2018-09-10 DIAGNOSIS — R251 Tremor, unspecified: Secondary | ICD-10-CM | POA: Insufficient documentation

## 2018-09-10 DIAGNOSIS — R110 Nausea: Secondary | ICD-10-CM | POA: Diagnosis not present

## 2018-09-10 DIAGNOSIS — G44201 Tension-type headache, unspecified, intractable: Secondary | ICD-10-CM | POA: Insufficient documentation

## 2018-09-10 DIAGNOSIS — K219 Gastro-esophageal reflux disease without esophagitis: Secondary | ICD-10-CM

## 2018-09-10 DIAGNOSIS — H905 Unspecified sensorineural hearing loss: Secondary | ICD-10-CM

## 2018-09-10 DIAGNOSIS — I10 Essential (primary) hypertension: Secondary | ICD-10-CM

## 2018-09-10 LAB — POCT I-STAT CREATININE: Creatinine, Ser: 1 mg/dL (ref 0.44–1.00)

## 2018-09-10 MED ORDER — HYDROXYCHLOROQUINE 200 MG TABLET
ORAL_TABLET | 3 refills | 0 days | Status: CP
Start: 2018-09-10 — End: 2018-09-20

## 2018-09-10 MED ORDER — GADOBUTROL 1 MMOL/ML IV SOLN
9.0000 mL | Freq: Once | INTRAVENOUS | Status: AC | PRN
  Administered 2018-09-10: 9 mL via INTRAVENOUS

## 2018-09-10 NOTE — Telephone Encounter (Signed)
Refill Request for Cholesterol medication. Atorvastatin 40 mg  Omeprazole 40 mg  Last physical: None indicated  Lab Results  Component Value Date   CHOL 220 (H) 10/31/2017   HDL 62 10/31/2017   LDLCALC 138 (H) 10/31/2017   TRIG 101 10/31/2017   CHOLHDL 3.5 10/31/2017    Follow up: 09/11/2018   *Amlodipine no longer indicated on med. list.

## 2018-09-10 NOTE — Progress Notes (Signed)
Name: Nicole Ballard   MRN: [id number]    DOB: 1955/12/01   Date:09/11/2018       Progress Note  Subjective  Chief Complaint  Chief Complaint  Patient presents with  . Follow-up    patient stated that her headaches have improved  . Medication Refill    metformin    HPI  Patient presents for follow-up of headaches. MRI completed yesterday without any acute abnormalities or concerns to explain pain, discussed results with patient. Patient tried acetaminophen, and flexeril for pain after she left the office and headache broke. Has not had any headaches since then. Did not need to take the meclizine because she no longer had vertigo episodes.   Patient has also cut her blood pressure pills in half as discussed and is feeling much better.    Patient Active Problem List   Diagnosis Date Noted  . Hyperglycemia 10/23/2017  . Vertigo 09/29/2016  . Bronchiolitis obliterans organizing pneumonia ([place]) 09/29/2016  . Neck pain, musculoskeletal 12/15/2015  . Anxiety about health 09/29/2015  . Abnormal CAT scan 04/11/2015  . Auditory impairment 04/11/2015  . Insomnia, persistent 04/11/2015  . Chronic kidney disease (CKD), stage I 04/11/2015  . Chronic nonmalignant pain 04/11/2015  . Chronic constipation 04/11/2015  . Dyslipidemia 04/11/2015  . Fibromyalgia 04/11/2015  . Gastro-esophageal reflux disease without esophagitis 04/11/2015  . Dysphonia 04/11/2015  . Absence of bladder continence 04/11/2015  . Low back pain 04/11/2015  . Eczema intertrigo 04/11/2015  . Keratosis pilaris 04/11/2015  . Chronic recurrent major depressive disorder ([place]) 04/11/2015  . Dysmetabolic syndrome 42/59/5638  . Neurogenic claudication 04/11/2015  . Perennial allergic rhinitis with seasonal variation 04/11/2015  . Abnormal presence of protein in urine 04/11/2015  . Seborrhea capitis 04/11/2015  . Moderate dysplasia of vulva 04/11/2015  . Vitamin D deficiency 04/11/2015  . Treadmill stress test negative  for angina pectoris 03/05/2015  . Shortness of breath 05/20/2014  . Morbid obesity ([place]) 02/03/2014  . Asthma, chronic 12/31/2013  . H/O total knee replacement 12/31/2013  . Episcleritis 09/26/2013  . Vocal cord dysfunction 05/28/2013  . Anxiety, generalized 03/19/2013  . Back pain 12/18/2012  . Pulmonary nodules 11/26/2012  . Lung nodule, multiple 11/26/2012  . Arthritis, degenerative 11/01/2012  . H/O aspiration pneumonitis 10/22/2012  . Postinflammatory pulmonary fibrosis ([place]) 10/22/2012  . Mixed incontinence 05/04/2012  . Benign neoplasm of pituitary gland and craniopharyngeal duct ([place]) 12/29/2010  . Lung involvement in systemic lupus erythematosus ([place]) 11/05/2010  . Chronic nausea 07/01/2008  . Benign hypertension 01/25/2005    Past Medical History:  Diagnosis Date  . Anxiety   . Bipolar 1 disorder ([place])   . Chronic kidney disease   . Chronic pain   . Diabetes mellitus without complication ([place])   . Fibromyalgia   . GERD (gastroesophageal reflux disease)   . Hearing loss of right ear   . Hemifacial spasm   . Hyperlipidemia   . Hypertension   . Insomnia   . Lumbago   . Osteoarthritis   . Paresthesia   . Rhinitis   . Systemic lupus erythematosus ([place])   . Vitamin D deficiency     Past Surgical History:  Procedure Laterality Date  . BRAIN SURGERY    . CHOLECYSTECTOMY    . VAGINAL HYSTERECTOMY      Social History   Tobacco Use  . Smoking status: Never Smoker  . Smokeless tobacco: Never Used  Substance Use Topics  . Alcohol use: No    Alcohol/week: 0.0  standard drinks     Current Outpatient Medications:  .  amLODipine (NORVASC) 2.5 MG tablet, TAKE 1 TABLET BY MOUTH  DAILY, Disp: 90 tablet, Rfl: 0 .  aspirin EC 81 MG tablet, Take 1 tablet (81 mg total) by mouth daily., Disp: 30 tablet, Rfl: 0 .  atorvastatin (LIPITOR) 40 MG tablet, TAKE 1 TABLET BY MOUTH  DAILY, Disp: 90 tablet, Rfl: 1 .  buPROPion (WELLBUTRIN XL) 150 MG 24 hr tablet, Take 1 tablet  by mouth daily. , Disp: , Rfl:  .  Cholecalciferol (VITAMIN D) 2000 UNITS CAPS, Take 1 capsule by mouth daily., Disp: , Rfl:  .  clobetasol ointment (TEMOVATE) 0.05 %, Apply topically., Disp: , Rfl:  .  clonazePAM (KLONOPIN) 0.5 MG tablet, Take 0.5 tablets (0.25 mg total) by mouth daily., Disp: 15 tablet, Rfl: 0 .  cyclobenzaprine (FLEXERIL) 5 MG tablet, TAKE 1 TABLET BY MOUTH  EVERY 8 HOURS AS NEEDED FOR MUSCLE SPASMS. DO NOT DRIVE FOR 8 HOURS, Disp: 90 tablet, Rfl: 1 .  diclofenac sodium (VOLTAREN) 1 % GEL, APP 2 GRAMS EXT AA QID, Disp: , Rfl: 6 .  docusate sodium (COLACE) 100 MG capsule, Take by mouth., Disp: , Rfl:  .  DULoxetine (CYMBALTA) 60 MG capsule, Take 120 mg by mouth at bedtime. , Disp: , Rfl:  .  FLOVENT HFA 110 MCG/ACT inhaler, INHALE 2 PUFFS PO BID, Disp: , Rfl: 0 .  fluticasone (FLONASE) 50 MCG/ACT nasal spray, USE 2 SPRAYS IN EACH  NOSTRIL DAILY, Disp: 48 g, Rfl: 1 .  gabapentin (NEURONTIN) 300 MG capsule, Take 1 capsule by mouth at bedtime., Disp: , Rfl:  .  hydroxychloroquine (PLAQUENIL) 200 MG tablet, Take 200 mg by mouth 2 (two) times daily., Disp: , Rfl:  .  ketoconazole (NIZORAL) 2 % cream, Apply 1 application topically daily., Disp: 15 g, Rfl: 0 .  lamoTRIgine (LAMICTAL) 200 MG tablet, Take 1 tablet (200 mg total) by mouth 2 (two) times daily., Disp: 60 tablet, Rfl: 0 .  meclizine (ANTIVERT) 25 MG tablet, Take 1 tablet (25 mg total) by mouth 3 (three) times daily as needed., Disp: 30 tablet, Rfl: 0 .  metFORMIN (GLUCOPHAGE-XR) 500 MG 24 hr tablet, TAKE 1 TABLET(500 MG) BY MOUTH DAILY WITH BREAKFAST, Disp: 90 tablet, Rfl: 1 .  Multiple Vitamin (MULTIVITAMIN) tablet, Take 1 tablet by mouth daily., Disp: , Rfl:  .  mycophenolate (MYFORTIC) 360 MG TBEC EC tablet, Take by mouth., Disp: , Rfl:  .  olopatadine (PATANOL) 0.1 % ophthalmic solution, Place 1 drop into both eyes 2 (two) times daily., Disp: , Rfl:  .  omeprazole (PRILOSEC) 40 MG capsule, TAKE 1 CAPSULE BY MOUTH  DAILY,  Disp: 90 capsule, Rfl: 1 .  ondansetron (ZOFRAN) 4 MG tablet, TAKE 1 TABLET BY MOUTH  EVERY 8 HOURS AS NEEDED FOR NAUSEA AND VOMITING, Disp: 60 tablet, Rfl: 0 .  prednisoLONE acetate (PRED FORTE) 1 % ophthalmic suspension, Place 1 drop into both eyes 2 (two) times daily as needed., Disp: , Rfl: 0 .  telmisartan-hydrochlorothiazide (MICARDIS HCT) 80-25 MG tablet, Take 1 tablet by mouth daily., Disp: 90 tablet, Rfl: 1 .  traZODone (DESYREL) 100 MG tablet, Take 1 tablet by mouth at bedtime., Disp: , Rfl:  .  triamcinolone ointment (KENALOG) 0.1 %, , Disp: , Rfl:  .  YUVAFEM 10 MCG TABS vaginal tablet, Place 1 tablet vaginally once a week., Disp: , Rfl:   Allergies  Allergen Reactions  . Ace Inhibitors  Other reaction(s): OTHER Pt states she can not take ace inhibitors. Pt states she can not remember her reaction.   Marland Kitchen Penicillins   . Pollen Extract     ROS   No other specific complaints in a complete review of systems (except as listed in HPI above).  Objective  Vitals:   09/11/18 0803  BP: 110/70  Pulse: 90  Resp: 16  Temp: 98.9 F (37.2 C)  TempSrc: Oral  SpO2: 97%  Weight: 200 lb 4.8 oz (90.9 kg)  Height: 5\' 8"  (1.727 m)    Body mass index is 30.46 kg/m.  Nursing Note and Vital Signs reviewed.  Physical Exam Vitals signs reviewed.  Constitutional:      Appearance: She is well-developed.  HENT:     Head: Normocephalic and atraumatic.  Eyes:     Extraocular Movements: Extraocular movements intact.     Conjunctiva/sclera:     Right eye: Right conjunctiva is not injected. No exudate.    Left eye: Left conjunctiva is injected. No exudate. Neck:     Musculoskeletal: Normal range of motion and neck supple.     Vascular: No carotid bruit.  Cardiovascular:     Heart sounds: Normal heart sounds.  Pulmonary:     Effort: Pulmonary effort is normal.     Breath sounds: Normal breath sounds.  Abdominal:     General: Bowel sounds are normal.     Palpations: Abdomen is  soft.     Tenderness: There is no abdominal tenderness.  Musculoskeletal: Normal range of motion.  Skin:    General: Skin is warm and dry.     Capillary Refill: Capillary refill takes less than 2 seconds.  Neurological:     Mental Status: She is alert and oriented to person, place, and time.     GCS: GCS eye subscore is 4. GCS verbal subscore is 5. GCS motor subscore is 6.     Cranial Nerves: No cranial nerve deficit.     Sensory: No sensory deficit.     Coordination: Coordination normal.     Gait: Gait normal.  Psychiatric:        Speech: Speech normal.        Behavior: Behavior normal.        Thought Content: Thought content normal.        Judgment: Judgment normal.      Results for orders placed or performed during the hospital encounter of 09/10/18 (from the past 48 hour(s))  I-STAT creatinine     Status: None   Collection Time: 09/10/18  3:37 PM  Result Value Ref Range   Creatinine, Ser 1.00 0.44 - 1.00 mg/dL    Assessment & Plan 1. Acute intractable tension-type headache Resolved, discussed MRI results, will refer back to neurology if symptoms return  2. Diabetes mellitus type 2 in obese ([place]) Follow up with PCP in 2 weeks  - metFORMIN (GLUCOPHAGE-XR) 500 MG 24 hr tablet; TAKE 1 TABLET(500 MG) BY MOUTH DAILY WITH BREAKFAST  Dispense: 90 tablet; Refill: 1  3. Acute viral conjunctivitis of left eye Discussed course and treatment and prevention of contamination of other eye.

## 2018-09-10 NOTE — Unmapped (Signed)
HCQ refill  Last ov: 05/22/2018  Next ov: 09/20/2018       Script will need to go to patients mail order, [facility].

## 2018-09-11 ENCOUNTER — Encounter: Payer: Self-pay | Admitting: Nurse Practitioner

## 2018-09-11 ENCOUNTER — Ambulatory Visit (INDEPENDENT_AMBULATORY_CARE_PROVIDER_SITE_OTHER): Payer: Medicare Other | Admitting: Nurse Practitioner

## 2018-09-11 VITALS — BP 110/70 | HR 90 | Temp 98.9°F | Resp 16 | Ht 68.0 in | Wt 200.3 lb

## 2018-09-11 DIAGNOSIS — G44201 Tension-type headache, unspecified, intractable: Secondary | ICD-10-CM

## 2018-09-11 DIAGNOSIS — B309 Viral conjunctivitis, unspecified: Secondary | ICD-10-CM

## 2018-09-11 DIAGNOSIS — E1169 Type 2 diabetes mellitus with other specified complication: Secondary | ICD-10-CM | POA: Diagnosis not present

## 2018-09-11 DIAGNOSIS — E669 Obesity, unspecified: Secondary | ICD-10-CM

## 2018-09-11 MED ORDER — METFORMIN HCL ER 500 MG PO TB24: ORAL_TABLET | ORAL | 1 refills | Status: DC

## 2018-09-11 NOTE — Patient Instructions (Signed)
- If headaches return please call us so we can refer you to neurology  - We will recheck your blood work and blood pressure at your follow-up with Dr. Ancil Boozer - For your eye- symptoms should resolve within 5 days, if it gets worse please let us know  Viral Conjunctivitis, Adult Viral conjunctivitis is an inflammation of the clear membrane that covers the white part of your eye and the inner surface of your eyelid (conjunctiva). The inflammation is caused by a viral infection. The blood vessels in the conjunctiva become inflamed, causing the eye to become red or pink, and often itchy. Viral conjunctivitis can be easily passed from one person to another (is contagious). This condition is often called pink eye. What are the causes? This condition is caused by a virus. A virus is a type of contagious germ. It can be spread by touching objects that have been contaminated with the virus, such as doorknobs or towels. It can also be passed through droplets, such as from coughing or sneezing. What are the signs or symptoms? Symptoms of this condition include:  Eye redness.  Tearing or watery eyes.  Itchy and irritated eyes.  Burning feeling in the eyes.  Clear drainage from the eye.  Swollen eyelids.  A gritty feeling in the eye.  Light sensitivity. This condition often occurs with other symptoms, such as a fever, nausea, or a rash. How is this diagnosed? This condition is diagnosed with a medical history and physical exam. If you have discharge from your eye, the discharge may be tested to rule out other causes of conjunctivitis. How is this treated? Viral conjunctivitis does not respond to medicines that kill bacteria (antibiotics). Treatment for viral conjunctivitis is directed at stopping a bacterial infection from developing in addition to the viral infection. Treatment also aims to relieve your symptoms, such as itching. This may be done with antihistamine drops or other eye medicines.  Rarely, steroid eye drops or antiviral medicines may be prescribed. Follow these instructions at home:  Avoid touching or rubbing your eyes.  Apply a warm, wet, clean washcloth to your eye for 10-20 minutes, 3-4 times per day or as told by your health care provider.  If you wear contact lenses, do not wear them until the inflammation is gone and your health care provider says it is safe to wear them again. Ask your health care provider how to sterilize or replace your contact lenses before using them again. Wear glasses until you can resume wearing contacts.  Avoid wearing eye makeup until the inflammation is gone. Throw away any old eye cosmetics that may be contaminated.  Gently wipe away any drainage from your eye with a warm, wet washcloth or a cotton ball. General instructions  Change or wash your pillowcase every day or as told by your health care provider.  Do not share towels, pillowcases, washcloths, eye makeup, makeup brushes, contact lenses, or glasses. This may spread the infection.  Wash your hands often with soap and water. Use paper towels to dry your hands. If soap and water are not available, use hand sanitizer.  Try to avoid contact with other people for one week or as told by your health care provider. Contact a health care provider if:  Your symptoms do not improve with treatment or they get worse.  You have increased pain.  Your vision becomes blurry.  You have a fever.  You have facial pain, redness, or swelling.  You have yellow or green drainage coming  from your eye.  You have new symptoms. This information is not intended to replace advice given to you by your health care provider. Make sure you discuss any questions you have with your health care provider. Document Released: 11/12/2002 Document Revised: 03/19/2016 Document Reviewed: 03/08/2016 Elsevier Interactive Patient Education  2019 [employer].

## 2018-09-14 ENCOUNTER — Ambulatory Visit: Payer: Medicare Other | Admitting: Family Medicine

## 2018-09-20 ENCOUNTER — Encounter: Admit: 2018-09-20 | Discharge: 2018-09-21 | Payer: MEDICARE

## 2018-09-20 DIAGNOSIS — M797 Fibromyalgia: Secondary | ICD-10-CM | POA: Diagnosis not present

## 2018-09-20 DIAGNOSIS — Z9289 Personal history of other medical treatment: Secondary | ICD-10-CM | POA: Diagnosis not present

## 2018-09-20 DIAGNOSIS — M3219 Other organ or system involvement in systemic lupus erythematosus: Secondary | ICD-10-CM | POA: Diagnosis not present

## 2018-09-20 DIAGNOSIS — Z96659 Presence of unspecified artificial knee joint: Secondary | ICD-10-CM | POA: Diagnosis not present

## 2018-09-20 DIAGNOSIS — I10 Essential (primary) hypertension: Secondary | ICD-10-CM | POA: Diagnosis not present

## 2018-09-20 DIAGNOSIS — Z880 Allergy status to penicillin: Secondary | ICD-10-CM | POA: Diagnosis not present

## 2018-09-20 DIAGNOSIS — K219 Gastro-esophageal reflux disease without esophagitis: Secondary | ICD-10-CM | POA: Diagnosis not present

## 2018-09-20 DIAGNOSIS — M542 Cervicalgia: Secondary | ICD-10-CM | POA: Diagnosis not present

## 2018-09-20 DIAGNOSIS — M48061 Spinal stenosis, lumbar region without neurogenic claudication: Secondary | ICD-10-CM | POA: Diagnosis not present

## 2018-09-20 DIAGNOSIS — M545 Low back pain: Secondary | ICD-10-CM | POA: Diagnosis not present

## 2018-09-20 DIAGNOSIS — Z7952 Long term (current) use of systemic steroids: Secondary | ICD-10-CM | POA: Diagnosis not present

## 2018-09-20 DIAGNOSIS — J45909 Unspecified asthma, uncomplicated: Secondary | ICD-10-CM | POA: Diagnosis not present

## 2018-09-20 DIAGNOSIS — D892 Hypergammaglobulinemia, unspecified: Secondary | ICD-10-CM | POA: Diagnosis not present

## 2018-09-20 DIAGNOSIS — Z7982 Long term (current) use of aspirin: Secondary | ICD-10-CM | POA: Diagnosis not present

## 2018-09-20 DIAGNOSIS — M150 Primary generalized (osteo)arthritis: Secondary | ICD-10-CM | POA: Diagnosis not present

## 2018-09-20 DIAGNOSIS — G8929 Other chronic pain: Secondary | ICD-10-CM | POA: Diagnosis not present

## 2018-09-20 DIAGNOSIS — G894 Chronic pain syndrome: Secondary | ICD-10-CM

## 2018-09-20 LAB — CBC W/ AUTO DIFF
BASOPHILS ABSOLUTE COUNT: 0.1 10*9/L (ref 0.0–0.1)
BASOPHILS RELATIVE PERCENT: 2.7 %
EOSINOPHILS ABSOLUTE COUNT: 0.1 10*9/L (ref 0.0–0.4)
HEMATOCRIT: 41.2 % (ref 36.0–46.0)
HEMOGLOBIN: 12.9 g/dL (ref 12.0–16.0)
LARGE UNSTAINED CELLS: 4 % (ref 0–4)
LYMPHOCYTES ABSOLUTE COUNT: 1.5 10*9/L (ref 1.5–5.0)
LYMPHOCYTES RELATIVE PERCENT: 34.4 %
MEAN CORPUSCULAR HEMOGLOBIN CONC: 31.2 g/dL (ref 31.0–37.0)
MEAN CORPUSCULAR HEMOGLOBIN: 28.2 pg (ref 26.0–34.0)
MEAN CORPUSCULAR VOLUME: 90.5 fL (ref 80.0–100.0)
MEAN PLATELET VOLUME: 9.2 fL (ref 7.0–10.0)
MONOCYTES ABSOLUTE COUNT: 0.4 10*9/L (ref 0.2–0.8)
MONOCYTES RELATIVE PERCENT: 8.4 %
NEUTROPHILS ABSOLUTE COUNT: 2.1 10*9/L (ref 2.0–7.5)
NEUTROPHILS RELATIVE PERCENT: 47 %
PLATELET COUNT: 369 10*9/L (ref 150–440)
RED CELL DISTRIBUTION WIDTH: 13.6 % (ref 12.0–15.0)
WBC ADJUSTED: 4.4 10*9/L — ABNORMAL LOW (ref 4.5–11.0)

## 2018-09-20 LAB — ALT (SGPT): Alanine aminotransferase:CCnc:Pt:Ser/Plas:Qn:: 15

## 2018-09-20 LAB — URINALYSIS WITH CULTURE REFLEX
BILIRUBIN UA: NEGATIVE
BLOOD UA: NEGATIVE
GLUCOSE UA: NEGATIVE
KETONES UA: NEGATIVE
NITRITE UA: NEGATIVE
PH UA: 5.5 (ref 5.0–9.0)
PROTEIN UA: 30 — AB
RBC UA: <1 /HPF (ref <=4)
SPECIFIC GRAVITY UA: 1.027 (ref 1.003–1.030)
TRANSITIONAL EPITHELIAL: <1 /HPF (ref 0–2)
UROBILINOGEN UA: 2 — AB
WBC UA: 4 /HPF (ref 0–5)

## 2018-09-20 LAB — CREATININE: EGFR CKD-EPI NON-AA FEMALE: 74 mL/min/{1.73_m2} (ref >=60)

## 2018-09-20 LAB — AST: AST (SGOT): 27 U/L (ref 14–38)

## 2018-09-20 LAB — C4 COMPLEMENT: Complement C4:MCnc:Pt:Ser/Plas:Qn:: 19.3

## 2018-09-20 LAB — MONOCYTES RELATIVE PERCENT: Lab: 8.4

## 2018-09-20 LAB — WBC UA: Lab: 4

## 2018-09-20 LAB — EGFR CKD-EPI AA FEMALE: Lab: 85

## 2018-09-20 LAB — PROTEIN URINE: Protein:MCnc:Pt:Urine:Qn:: 11.8

## 2018-09-20 LAB — AST (SGOT): Aspartate aminotransferase:CCnc:Pt:Ser/Plas:Qn:: 27

## 2018-09-20 LAB — C3 COMPLEMENT: Complement C3:MCnc:Pt:Ser/Plas:Qn:: 101

## 2018-09-20 MED ORDER — DICLOFENAC 1 % TOPICAL GEL
Freq: Four times a day (QID) | TOPICAL | 5 refills | 0 days | Status: CP
Start: 2018-09-20 — End: 2018-12-03

## 2018-09-20 MED ORDER — HYDROXYCHLOROQUINE 200 MG TABLET
ORAL_TABLET | Freq: Two times a day (BID) | ORAL | 3 refills | 0 days | Status: CP
Start: 2018-09-20 — End: 2019-01-22

## 2018-09-20 NOTE — Unmapped (Addendum)
No changes to medication.    Recommend you apply the diclofenac gel to your knees.     Have dermatology keep in touch about your inverse psoriasis.    Patient Education        Patellofemoral Pain Syndrome (Runner's Knee): Exercises  Introduction  Here are some examples of exercises for you to try. The exercises may be suggested for a condition or for rehabilitation. Start each exercise slowly. Ease off the exercises if you start to have pain.  You will be told when to start these exercises and which ones will work best for you.  How to do the exercises  Calf wall stretch   1. Stand facing a wall with your hands on the wall at about eye level. Put your affected leg about a step behind your other leg.  2. Keeping your back leg straight and your back heel on the floor, bend your front knee and gently bring your hip and chest toward the wall until you feel a stretch in the calf of your back leg.  3. Hold the stretch for at least 15 to 30 seconds.  4. Repeat 2 to 4 times.  5. Repeat steps 1 through 4, but this time keep your back knee bent.    Quadriceps stretch   1. If you are not steady on your feet, hold on to a chair, counter, or wall.  2. Bend your affected leg, and reach behind you to grab the front of your foot or ankle with the hand on the same side. For example, if you are stretching your right leg, use your right hand.  3. Keeping your knees next to each other, pull your foot toward your buttock until you feel a gentle stretch across the front of your hip and down the front of your thigh. Your knee should be pointed directly to the ground, and not out to the side.  4. Hold the stretch for at least 15 to 30 seconds.  5. Repeat 2 to 4 times.    Hamstring wall stretch   1. Lie on your back in a doorway, with your good leg through the open door.  2. Slide your affected leg up the wall to straighten your knee. You should feel a gentle stretch down the back of your leg.  1. Do not arch your back.  2. Do not bend either knee.  3. Keep one heel touching the floor and the other heel touching the wall. Do not point your toes.  3. Hold the stretch for at least 1 minute. Then over time, try to lengthen the time you hold the stretch to as long as 6 minutes.  4. Repeat 2 to 4 times.  5. If you do not have a place to do this exercise in a doorway, there is another way to do it:  6. Lie on your back, and bend your affected leg.  7. Loop a towel under the ball and toes of that foot, and hold the ends of the towel in your hands.  8. Straighten your knee, and slowly pull back on the towel. You should feel a gentle stretch down the back of your leg.  9. Hold the stretch for at least 15 to 30 seconds. Or even better, hold the stretch for 1 minute if you can.  10. Repeat 2 to 4 times.    Quad sets   1. Sit with your affected leg straight and supported on the floor or a firm bed. Place  a small, rolled-up towel under your affected knee. Your other leg should be bent, with that foot flat on the floor.  2. Tighten the thigh muscles of your affected leg by pressing the back of your knee down into the towel.  3. Hold for about 6 seconds, then rest for up to 10 seconds.  4. Repeat 8 to 12 times.    Straight-leg raises to the front   1. Lie on your back with your good knee bent so that your foot rests flat on the floor. Your affected leg should be straight. Make sure that your low back has a normal curve. You should be able to slip your hand in between the floor and the small of your back, with your palm touching the floor and your back touching the back of your hand.  2. Tighten the thigh muscles in your affected leg by pressing the back of your knee flat down to the floor. Hold your knee straight.  3. Keeping the thigh muscles tight and your leg straight, lift your affected leg up so that your heel is about 12 inches off the floor.  4. Hold for about 6 seconds, then lower your leg slowly. Rest for up to 10 seconds between repetitions.  5. Repeat 8 to 12 times.    Straight-leg raises to the back   1. Lie on your stomach, and lift your leg straight up behind you (toward the ceiling).  2. Lift your toes about 6 inches off the floor, hold for about 6 seconds, then lower slowly.  3. Do 8 to 12 repetitions.    Wall slide with ball squeeze   1. Stand with your back against a wall and with your feet about shoulder-width apart. Your feet should be about 12 inches away from the wall.  2. Put a ball about the size of a soccer ball between your knees. Then slowly slide down the wall until your knees are bent about 20 to 30 degrees.  3. Tighten your thigh muscles by squeezing the ball between your knees. Hold that position for about 10 seconds, then stop squeezing. Rest for up to 10 seconds between repetitions.  4. Repeat 8 to 12 times.    Follow-up care is a key part of your treatment and safety. Be sure to make and go to all appointments, and call your doctor if you are having problems. It's also a good idea to know your test results and keep a list of the medicines you take.  Where can you learn more?  Go to [facility] at https://myuncchart.org  [facility] [employer]. Enter A404 in the search box to learn more about Patellofemoral Pain Syndrome (Runner's Knee): Exercises.  Current as of: February 28, 2018  Content Version: 12.3  ?? 2006-2019 [employer]. Care instructions adapted under license by [facility]. If you have questions about a medical condition or this instruction, always ask your healthcare professional. [employer] disclaims any warranty or liability for your use of this information.

## 2018-09-20 NOTE — Unmapped (Signed)
Patient Name: Caitlin Smith [facility]  ONG:[id number] Edwyna Ready, MD  Source of History: Patient and records  Date of Visit: 09/20/18    Chief Compliant: SLE, OA, fibromyalgia follow-up    Prior Rheumatologic History: Initially diagnosed in 2006 with SLE presenting with episcleritis and arthralgia with serology showing +ANA, +dsDNA.  She also has stage I CKD felt secondary not due to SLE but due to HTN and obesity. She had been previously followed by Dr. Hortencia Conradi who retired. Patient was evaluated by myself for the first time in September 2015.  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 She has had difficulty tapering off prednisone for her pulmonary process and most recently started on mycophenolate mofetil. She is being evaluated in Psychiatry for cognition issues and depression. Patient has been having confusion with her medications and was seen most recently by our clinical pharmacist to clarify meds.     HPI: Caitlin Smith is a 63 y.o. female who is here today for follow-up for SLE, OA, and fibromyalgia. 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 At follow up with myself in May 2019, switched from Cellcept to Myfortic due to GI side effects. Last evaluated by Carlus Pavlov [facility] in September 2019 and referred to Dermatology for rash, unclear if related to lupus. Evaluation by Dermatology in 07/2018 showed inverse psoriasis and recommended topical treatment and avoidance of prednisone. Otherwise, no changes were made to her medication regimen. She returns today for follow-up.    Today, patient reports developing psoriasis all over, particularly in naval, ear, and on bra line. Using topicals but found better relief with clobetasol than triamcinolone which she has been switched.  Reports knee pain that worsens at night and with movement. Reports knee swelling. Reports improvement in breathing. She continues on hydroxychloroquine 200 mg bid and myfortic 360 mg BID. She completed her prednisone taper in the past month and is no longer taking. She has not been taking her vitamin D and calcium regularly. She also reports a 55 pound weight loss in the interim with diet change and exercise. She continues to walk four days a week, but reports discomfort due to her knee pain.     ROS??: Attests to the above, otherwise, review of all other systems is negative.  ??  Past Medical and Surgical History:  ??  Patient Active Problem List    Diagnosis Date Noted   ??? Hyperglycemia 10/23/2017   ??? Bronchiolitis obliterans organizing pneumonia (CMS-HCC) 09/29/2016   ??? Vertigo 09/29/2016   ??? Neck pain 12/15/2015   ??? Neck pain, musculoskeletal 12/15/2015   ??? Fibromyalgia 10/12/2015   ??? Anxiety about health 09/29/2015   ??? Abnormal computed tomography scan 04/11/2015   ??? Abnormal presence of protein in urine 04/11/2015   ??? Urinary incontinence 04/11/2015   ??? Hearing difficulty 04/11/2015   ??? Chronic constipation 04/11/2015   ??? Chronic kidney disease (CKD), stage I 04/11/2015   ??? Chronic pain not due to malignancy 04/11/2015   ??? Chronic recurrent major depressive disorder (CMS-HCC) 04/11/2015   ??? Dyslipidemia 04/11/2015   ??? Obesity, diabetes, and hypertension syndrome (CMS-HCC) 04/11/2015   ??? Intertrigo 04/11/2015   ??? Fibrositis 04/11/2015   ??? Gastroesophageal reflux disease without esophagitis 04/11/2015   ??? Vitamin D deficiency 04/11/2015   ??? Seborrhea capitis 04/11/2015   ??? Perennial allergic rhinitis with seasonal variation 04/11/2015   ???  Moderate dysplasia of vulva 04/11/2015   ??? Spinal stenosis of lumbar region 04/11/2015   ??? Low back pain 04/11/2015   ??? Keratosis pilaris 04/11/2015   ??? Insomnia, persistent 04/11/2015   ??? Abnormal CAT scan 04/11/2015   ??? Absence of bladder continence 04/11/2015   ??? Auditory impairment 04/11/2015   ??? Chronic nonmalignant pain 04/11/2015   ??? Dysmetabolic syndrome 04/11/2015   ??? Eczema intertrigo 04/11/2015   ??? Gastro-esophageal reflux disease without esophagitis 04/11/2015   ??? Neurogenic claudication 04/11/2015   ??? Screening for cardiovascular condition 03/05/2015   ??? Encounter for screening for cardiovascular disorders 03/05/2015   ??? Treadmill stress test negative for angina pectoris 03/05/2015   ??? Nausea 10/30/2014   ??? Cough 05/20/2014   ??? Shortness of breath 05/20/2014   ??? Regurgitation 04/24/2014   ??? Epigastric burning sensation 04/24/2014   ??? Obesity (BMI 30-39.9) 02/03/2014   ??? Obesity with body mass index 30 or greater 02/03/2014   ??? Morbid obesity (CMS-HCC) 02/03/2014   ??? S/P total knee replacement 12/31/2013   ??? Asthma 12/31/2013   ??? History of total knee replacement 12/31/2013   ??? Asthma, chronic 12/31/2013   ??? H/O total knee replacement 12/31/2013   ??? SLE (systemic lupus erythematosus) (CMS-HCC) 11/28/2013   ??? Episcleritis 09/26/2013   ??? Constipation 07/19/2013   ??? Pre-op evaluation 07/10/2013   ??? Other diseases of vocal cords 05/28/2013   ??? Vocal cord dysfunction 05/28/2013   ??? Generalized anxiety disorder 03/19/2013   ??? Pain medication agreement signed 02/12/2013   ??? Family history of breast cancer    ??? 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 Backache 12/18/2012   ??? Back pain 12/18/2012   ??? Nonspecific abnormal finding of lung field 11/26/2012   ??? Multiple pulmonary nodules 11/26/2012   ??? Lung nodule, multiple 11/26/2012   ??? Pulmonary nodules 11/26/2012   ??? Osteoarthrosis 11/01/2012   ??? Osteoarthritis 11/01/2012   ??? Arthritis, degenerative 11/01/2012   ??? Postinflammatory pulmonary fibrosis (CMS-HCC) 10/22/2012   ??? Aspiration pneumonitis (CMS-HCC) 10/22/2012   ??? History of respiratory system disease 10/22/2012   ??? H/O aspiration pneumonitis 10/22/2012   ??? Mixed urge and stress incontinence 05/04/2012   ??? Mixed incontinence 05/04/2012   ??? Dysphonia 03/22/2012   ??? Sensorineural hearing loss 03/22/2012   ??? Chronic pain syndrome 05/19/2011   ??? Myalgia and myositis 02/25/2011   ??? Benign neoplasm of pituitary gland and craniopharyngeal duct (pouch) (CMS-HCC) 12/29/2010   ??? Benign neoplasm of pituitary gland and craniopharyngeal duct (CMS-HCC) 12/29/2010   ??? Chronic kidney disease, stage I 12/08/2010   ??? Proteinuria 12/08/2010   ??? Depressive disorder 11/05/2010   ??? Lupus erythematosus 11/05/2010   ??? Inflammatory autoimmune disorder (CMS-HCC) 11/05/2010   ??? Systemic lupus erythematosus (CMS-HCC) 11/05/2010   ??? Lung involvement in systemic lupus erythematosus (CMS-HCC) 11/05/2010   ??? Chronic nausea 07/01/2008   ??? Hypertension, benign 01/25/2005   ??? Benign hypertension 01/25/2005     Past Surgical History:   Procedure Laterality Date   ??? BRAIN SURGERY      for facial spasms   ??? BREAST BIOPSY Right 2012    Needle bx   ??? GALLBLADDER SURGERY     ??? HYSTERECTOMY N/A 07/09/2001    Vaginal Hysterectomy with ovaries in place   ??? JOINT REPLACEMENT     ??? PR ANAL PRESSURE RECORD N/A 03/22/2016    Procedure: ANORECTAL MANOMETRY;  Surgeon: Nurse-Based Giproc;  Location: [facility];  Service: Gastroenterology   ??? PR BRONCHOSCOPY,DIAGNOSTIC W LAVAGE Bilateral 04/25/2016    Procedure: BRONCHOSCOPY, RIGID OR FLEXIBLE, INCLUDE FLUOROSCOPIC GUIDANCE WHEN PERFORMED; W/BRONCHIAL ALVEOLAR LAVAGE WITH MODERATE SEDATION;  Surgeon: Mercy Moore, MD;  Location: BRONCH PROCEDURE LAB [facility];  Service: Pulmonary   ??? PR BRONCHOSCOPY,TRANSBRONCH BIOPSY N/A 04/25/2016    Procedure: BRONCHOSCOPY, RIGID/FLEXIBLE, INCLUDE FLUORO GUIDANCE WHEN PERFORMED; W/TRANSBRONCHIAL LUNG BX, SINGLE LOBE WITH MODERATE SEDATION;  Surgeon: Mercy Moore, MD;  Location: BRONCH PROCEDURE LAB [facility];  Service: Pulmonary   ??? PR COLON CA SCRN NOT HI RSK IND  08/22/2013    Procedure: COLOREC CNCR SCR;COLNSCPY NO;  Surgeon: Brown Human, MD;  [facility];  Service: Gastroenterology   ??? PR GERD TST W/ MUCOS IMPEDE ELECTROD,>1HR N/A 05/26/2014    Procedure: ESOPHAGEAL FUNCTION TEST, GASTROESOPHAGEAL REFLUX TEST W/ NASAL CATHETER INTRALUMINAL IMPEDANCE ELECTRODE(S) PLACEMENT, RECORDING, ANALYSIS AND INTERPRETATION; PROLONGED;  Surgeon: Nurse-Based Giproc;  Location: GI [facility]URES [facility];  Service: Gastroenterology   ??? PR TOTAL KNEE ARTHROPLASTY Right 12/31/2013    Procedure: ARTHROPLASTY, KNEE, CONDYLE & PLATEAU; MEDIAL & LAT COMPARTMENT W/WO PATELLA RESURFACE (TOTAL KNEE ARTHROP);  Surgeon: Aram Beecham, MD;  Location: MAIN OR [facility];  Service: Orthopedics   ??? PR UPPER GI ENDOSCOPY,BIOPSY N/A 11/07/2014    Procedure: UGI ENDOSCOPY; WITH BIOPSY, SINGLE OR MULTIPLE;  Surgeon: Trula Slade, MD;  Location: [facility];  Service: Gastroenterology   ??? wide local excision of vulva Right      Allergies:   ??  Allergies   Allergen Reactions   ??? Vilazodone Swelling   ??? Ace Inhibitors Other (See Comments)     Pt states she can not take ace inhibitors. Pt states she can not remember her reaction.    ??? Pollen Extracts    ??? Penicillin G Rash   ??? Penicillins Rash     Current Outpatient Medications:  ??  Current Outpatient Medications on File Prior to Visit   Medication Sig Dispense Refill   ??? amLODIPine (NORVASC) 2.5 MG tablet Take 2.5 mg by mouth daily.     ??? aspirin (ECOTRIN) 81 MG tablet Take 81 mg by mouth daily.     ??? atorvastatin (LIPITOR) 40 MG tablet Take 40 mg by mouth daily.     ??? buPROPion (WELLBUTRIN XL) 150 MG 24 hr tablet Take 1 tablet (150 mg total) by mouth daily. 90 tablet 0   ??? calcipotriene (DOVONOX) 0.005 % ointment Apply every other day to areas of rash in folds as maintenance 60 g 2   ??? clobetasol (TEMOVATE) 0.05 % ointment Apply topically.     ??? clonazePAM (KLONOPIN) 0.5 MG tablet Take 0.5 tablets (0.25 mg total) by mouth nightly. 15 tablet 2   ??? cyclobenzaprine (FLEXERIL) 5 MG tablet Take 1 tablet (5 mg total) by mouth two (2) times a day as needed. 60 tablet 2   ??? dicyclomine (BENTYL) 20 mg tablet Take 20 mg by mouth every six (6) hours.     ??? DULoxetine (CYMBALTA) 60 MG capsule Take 2 capsules (120 mg total) by mouth daily. 60 capsule 2   ??? estradiol (VAGIFEM) 10 mcg vaginal tablet Insert 1 tablet (0.01 mg total) into the vagina Two (2) times a week. 24 tablet 8   ??? gabapentin (NEURONTIN) 300 MG capsule Take 3 capsules (900 mg total) by mouth Three (3) times a day. 810 capsule 1   ??? ketoconazole (NIZORAL) 2 % cream APP EXT  AA D  0   ??? lamoTRIgine (LAMICTAL) 200 MG tablet Take 1 tablet (200 mg total) by mouth Two (2) times a day. 60 tablet 2   ??? meclizine (ANTIVERT) 25 mg tablet Take 25 mg by mouth Three (3) times a day as needed.      ??? metFORMIN (GLUCOPHAGE) 500 MG tablet Take 500 mg by mouth 2 (two) times a day with meals.      ??? mupirocin (BACTROBAN) 2 % ointment APP EXT IEN BID  0   ??? mycophenolate (MYFORTIC) 360 MG TbEC TAKE 1 TABLET BY MOUTH TWICE DAILY 180 each 3   ??? omeprazole (PRILOSEC) 40 MG capsule Take 40 mg by mouth daily.      ??? polyethylene glycol (GLYCOLAX) 17 gram/dose powder 2 cap fulls in a full glass of water, three times a day, for 5 days.     ??? telmisartan-hydrochlorothiazide (MICARDIS HCT) 80-25 mg per tablet Take 1 tablet by mouth daily.     ??? traZODone (DESYREL) 100 MG tablet Take 2 tablets (200 mg total) by mouth nightly. 60 tablet 2   ??? triamcinolone (KENALOG) 0.1 % ointment Apply to rash when itchy as needed, few times per week 80 g 2   ??? PROAIR HFA 90 mcg/actuation inhaler Inhale 2 puffs every six (6) hours as needed for wheezing. DAW 1 3 Inhaler 4     No current facility-administered medications on file prior to visit.      ??  Immunization History   Administered Date(s) Administered   ??? Hepatitis A 08/12/2015, 02/10/2016   ??? Hepatitis B, Adult 06/26/2007, 08/12/2015, 10/13/2015, 02/10/2016   ??? INFLUENZA TIV (TRI) 106MO+ W/ PRESERV (IM) 06/01/2010, 05/14/2013, 04/19/2014   ??? INFLUENZA TIV (TRI) PF (IM) 06/26/2007, 06/02/2011, 12/01/2011, 06/09/2013   ??? Influenza Vaccine Quad (IIV4 PF) 17mo+ injectable 04/16/2015, 05/26/2016, 05/17/2017, 05/14/2018   ??? MMR 06/26/2007   ??? PNEUMOCOCCAL POLYSACCHARIDE 23 08/04/2016   ??? Pneumococcal Conjugate 13-Valent 08/06/2015, 05/26/2016   ??? TdaP 06/26/2007     ????  PHYSICAL EXAM??  Vital signs: BP 110/60 (BP Site: L Arm, BP Position: Sitting, BP Cuff Size: Medium)  - Pulse 84  - Temp 36.8 ??C (98.2 ??F) (Oral)  - Wt 90.3 kg (199 lb 1.2 oz)  - BMI 30.27 kg/m??   Gen: Well-developed, well-nourished adult in no apparent distress. Normocephalic with no external signs of trauma. Pleasant and cooperative. AOx4.?  HEENT: PERRLA, EOMI, MMM, oropharynx in pink without ulcerations or exudates visualized.??  Salivary pool present. Conjunctiva moist.   Lungs: Broad chest excursion with good air movement. CTAB without wheezing/rhonchi/rales. ????  CV: RRR, Normal S1/S2, No murmurs/rubs/gallops heard.  PV: Warm, 2+ radial and pedal pulses, no C/C/E.????  Neuro: Good comprehension/cognition. CN 2-12 intact.  Muscle strength 5/5 in all extremities. No sensory deficits. Gait normal.????  Comprehensive Musculoskeletal Examination:????  ?? Jaw, neck without limited ROM.????  ?? Shoulders, elbows, wrists, hands, fingers:  No deformity, erythema, warmth, swelling, effusion, limited ROM.?? Left wrist in splint.  ?? Lumbosacral spine and hips without limited ROM.??    ?? Knee, ankles, feet, toes: No deformity, erythema, warmth, swelling, effusion, limited ROM. Knee stable to valgus/varus stress and anterior/posterior drawer sign.??   Skin: fain inverse psoriasis related rash on lower back but do not see in periumbilical region despite patient's complaint of active rash    LABORATORY  Recent Results (from the past 3024 hour(s))   Urinalysis with Culture Reflex    Collection Time: 05/22/18 12:02 PM  Result Value Ref Range    Color, UA Yellow     Clarity, UA Clear     Specific Gravity, UA 1.021 1.003 - 1.030    pH, UA 5.5 5.0 - 9.0    Leukocyte Esterase, UA Small (A) Negative    Nitrite, UA Negative Negative    Protein, UA Trace (A) Negative    Glucose, UA Negative Negative    Ketones, UA Negative Negative    Urobilinogen, UA 0.2 mg/dL 0.2 mg/dL, 1.0 mg/dL    Bilirubin, UA Negative Negative    Blood, UA Negative Negative    RBC, UA 1 <=4 /HPF    WBC, UA 1 0 - 5 /HPF    Squam Epithel, UA <1 0 - 5 /HPF    Bacteria, UA None Seen None Seen /HPF    Mucus, UA Few (A) None Seen /HPF   Protein/Creatinine Ratio, Urine    Collection Time: 05/22/18 12:02 PM   Result Value Ref Range    Creat U 135.8 Undefined mg/dL    Protein, Ur 5.9 Undefined mg/dL    Protein/Creatinine Ratio, Urine 0.043 Undefined   Urine Culture    Collection Time: 05/22/18 12:02 PM   Result Value Ref Range    Urine Culture, Comprehensive Mixed Urogenital Flora    Comprehensive Metabolic Panel    Collection Time: 05/22/18 12:07 PM   Result Value Ref Range    Sodium 138 135 - 145 mmol/L    Potassium 5.1 (H) 3.5 - 5.0 mmol/L    Chloride 100 98 - 107 mmol/L    CO2 33.0 (H) 22.0 - 30.0 mmol/L    BUN 14 7 - 21 mg/dL    Creatinine 5.40 9.81 - 1.00 mg/dL    BUN/Creatinine Ratio 18     EGFR CKD-EPI Non-African American, Female 80 >=60 mL/min/1.76m2    EGFR CKD-EPI African American, Female >90 >=60 mL/min/1.26m2    Glucose 99 65 - 179 mg/dL    Calcium 9.3 8.5 - 19.1 mg/dL    Albumin 4.0 3.5 - 5.0 g/dL    Total Protein 7.7 6.5 - 8.3 g/dL    Total Bilirubin 0.4 0.0 - 1.2 mg/dL    AST 29 17 - 47 U/L    ALT 28 13 - 69 U/L    Alkaline Phosphatase 74 38 - 126 U/L    Anion Gap 5 (L) 9 - 15 mmol/L   C3 complement    Collection Time: 05/22/18 12:07 PM   Result Value Ref Range    C3 Complement 108 88 - 171 mg/dL   C4 complement    Collection Time: 05/22/18 12:07 PM   Result Value Ref Range    C4 Complement 21.7 15.0 - 48.0 mg/dL   Anti-DNA antibody, double-stranded    Collection Time: 05/22/18 12:07 PM   Result Value Ref Range    dsDNA Ab Negative Negative   TSH    Collection Time: 05/22/18 12:07 PM   Result Value Ref Range    TSH 0.712 0.600 - 3.300 uIU/mL   T4, Free Collection Time: 05/22/18 12:07 PM   Result Value Ref Range    Free T4 1.39 0.71 - 1.40 ng/dL   T3, Free    Collection Time: 05/22/18 12:07 PM   Result Value Ref Range    T3, Free 4.09 2.45 - 5.93 pg/mL   CBC w/ Differential    Collection Time: 05/22/18 12:07 PM   Result Value Ref Range    WBC 5.9 4.5 - 11.0 10*9/L  RBC 4.54 4.00 - 5.20 10*12/L    HGB 12.9 12.0 - 16.0 g/dL    HCT 16.1 09.6 - 04.5 %    MCV 89.2 80.0 - 100.0 fL    MCH 28.4 26.0 - 34.0 pg    MCHC 31.8 31.0 - 37.0 g/dL    RDW 40.9 81.1 - 91.4 %    MPV 6.6 (L) 7.0 - 10.0 fL    Platelet 353 150 - 440 10*9/L    Neutrophils % 68.3 %    Lymphocytes % 20.6 %    Monocytes % 4.1 %    Eosinophils % 2.4 %    Basophils % 0.9 %    Absolute Neutrophils 4.0 2.0 - 7.5 10*9/L    Absolute Lymphocytes 1.2 (L) 1.5 - 5.0 10*9/L    Absolute Monocytes 0.2 0.2 - 0.8 10*9/L    Absolute Eosinophils 0.1 0.0 - 0.4 10*9/L    Absolute Basophils 0.1 0.0 - 0.1 10*9/L    Large Unstained Cells 4 0 - 4 %    Hypochromasia Slight (A) Not Present   Spirometry    Collection Time: 06/11/18 10:21 AM   Result Value Ref Range    FEV1 PRE 1.37 (L) 1.69409 - 3.02349 L    FEV1/FVC PRE 75.48 68.744 - 90.098 %    FVC PRE 1.81 (L) 2.21842 - 3.77324 L    PEF PRE 7.12 7.82956 - 8.28424 L/s    Vol extrap pre 0.02 L    FIVC PRE 0.01 (L) 2.21842 - 3.77324 L    FEV6 PRE 1.74 (L) 2.13717 - 2.13086 L    FEV1/FEV6 PRE 78.47 72.048 - 91.714 %    FEF50% PRE 2.51 L/s    FEF25-75% PRE 1.08 0.78382 - 3.7848 L/s    VHQION62-95 PRE 1.08 L/s    FET100% Change 8.67 sec    DLCO PRE 20.22 28.41324401027 - 29.328134336920005 ml/(min*mmHg)    BHT POST 12.64 sec    DLCO/VA POST 5.71 (H) 2.5366440347425956 - 3.8756433295188416 ml/(min*mmHg*L)    VA PRE 3.55 (L) 4.99887 - 4.99887 L    IVC PRE 2.05 (L) 2.21842 - 6.06301 L    DL Adj PRE 60.10 ml/(min*mmHg)    DL/VA Adj PRE 9.32 ml/(min*mmHg*L)   Diffusion Studies    Collection Time: 06/11/18 10:21 AM   Result Value Ref Range    FEV1 PRE 1.37 (L) 1.69409 - 3.02349 L FEV1/FVC PRE 75.48 68.744 - 90.098 %    FVC PRE 1.81 (L) 2.21842 - 3.77324 L    PEF PRE 7.12 3.55732 - 8.28424 L/s    Vol extrap pre 0.02 L    FIVC PRE 0.01 (L) 2.21842 - 3.77324 L    FEV6 PRE 1.74 (L) 2.13717 - 2.02542 L    FEV1/FEV6 PRE 78.47 72.048 - 91.714 %    FEF50% PRE 2.51 L/s    FEF25-75% PRE 1.08 0.78382 - 3.7848 L/s    ISOFEF25-75 PRE 1.08 L/s    FET100% Change 8.67 sec    DLCO PRE 20.22 70.62376283151 - 29.328134336920005 ml/(min*mmHg)    BHT POST 12.64 sec    DLCO/VA POST 5.71 (H) 7.6160737106269485 - 4.6270350093818299 ml/(min*mmHg*L)    VA PRE 3.55 (L) 4.99887 - 4.99887 L    IVC PRE 2.05 (L) 2.21842 - 3.71696 L    DL Adj PRE 78.93 ml/(min*mmHg)    DL/VA Adj PRE 8.10 ml/(min*mmHg*L)   CBC w/ Differential    Collection Time: 09/20/18 12:15 PM   Result Value Ref Range    WBC 4.4 (L) 4.5 -  11.0 10*9/L    RBC 4.55 4.00 - 5.20 10*12/L    HGB 12.9 12.0 - 16.0 g/dL    HCT 16.1 09.6 - 04.5 %    MCV 90.5 80.0 - 100.0 fL    MCH 28.2 26.0 - 34.0 pg    MCHC 31.2 31.0 - 37.0 g/dL    RDW 40.9 81.1 - 91.4 %    MPV 9.2 7.0 - 10.0 fL    Platelet 369 150 - 440 10*9/L    Neutrophils % 47.0 %    Lymphocytes % 34.4 %    Monocytes % 8.4 %    Eosinophils % 3.3 %    Basophils % 2.7 %    Absolute Neutrophils 2.1 2.0 - 7.5 10*9/L    Absolute Lymphocytes 1.5 1.5 - 5.0 10*9/L    Absolute Monocytes 0.4 0.2 - 0.8 10*9/L    Absolute Eosinophils 0.1 0.0 - 0.4 10*9/L    Absolute Basophils 0.1 0.0 - 0.1 10*9/L    Large Unstained Cells 4 0 - 4 %    Hypochromasia Slight (A) Not Present       ?GENERAL SUMMARY/IMPRESSION AND RECOMMENDATIONS: ??    ??In summary, the patient is a 63 y.o. female with h/o SLE (+ANA, +dsDNA, arthralgia, episcleritis), OA, fibromyalgia and CKD stage I secondary to HTN. Since her last visits to rheumatology, diagnosed with inverse psoriasis.  She is currently on hydroxychloroquine 200 mg bid and myfortic 360 mg BID without any side effects, and she has also completed her prednisone taper. Exam today with no signs for inflammatory arthritis. No changes to current regimen. For her knee pain likely due to OA and patellofemoral syndrome, will order diclofenac gel for topical relief. Exercises provided for patellofemoral pain syndrome.  Recommend continued follow up with Dermatology regarding inverse psoriasis. Would like to avoid discontinuing hydroxychloroquine which has been noted to aggravate psoriasis given her underlying SLE.  Labs today to monitor SLE disease activity.  Follow-up in four months with Carlus Pavlov [facility] and eight months with myself.        Diagnosis ICD-10-CM Associated Orders   1. Other systemic lupus erythematosus with other organ involvement (CMS-HCC) M32.19 CBC w/ Differential     Creatinine     AST     ALT     C3 complement     C4 complement     Anti-DSDNA     Urinalysis with Culture Reflex     Protein/Creatinine Ratio, Urine     hydroxychloroquine (PLAQUENIL) 200 mg tablet     CBC w/ Differential     Creatinine     AST     ALT     C3 complement     C4 complement     Anti-DSDNA     CBC w/ Differential     Urine Culture   2. Hypergammaglobulinemia D89.2 Serum Protein Electrophoresis and Immunofixation     Serum Protein Electrophoresis and Immunofixation   3. Primary osteoarthritis involving multiple joints M15.0 diclofenac sodium (VOLTAREN) 1 % gel   4. Current chronic use of systemic steroids Z79.52    5. Fibromyalgia M79.7    6. Chronic pain syndrome G89.4    7. Spinal stenosis of lumbar region, unspecified whether neurogenic claudication present M48.061    8. Cervicalgia M54.2    9. Chronic midline low back pain M54.5     G89.29      Patient Instructions     No changes to medication.    Recommend you apply the diclofenac  gel to your knees.     Have dermatology keep in touch about your inverse psoriasis.    Patient Education        Patellofemoral Pain Syndrome (Runner's Knee): Exercises  Introduction  Here are some examples of exercises for you to try. The exercises may be suggested for a condition or for rehabilitation. Start each exercise slowly. Ease off the exercises if you start to have pain.  You will be told when to start these exercises and which ones will work best for you.  How to do the exercises  Calf wall stretch   1. Stand facing a wall with your hands on the wall at about eye level. Put your affected leg about a step behind your other leg.  2. Keeping your back leg straight and your back heel on the floor, bend your front knee and gently bring your hip and chest toward the wall until you feel a stretch in the calf of your back leg.  3. Hold the stretch for at least 15 to 30 seconds.  4. Repeat 2 to 4 times.  5. Repeat steps 1 through 4, but this time keep your back knee bent.    Quadriceps stretch   1. If you are not steady on your feet, hold on to a chair, counter, or wall.  2. Bend your affected leg, and reach behind you to grab the front of your foot or ankle with the hand on the same side. For example, if you are stretching your right leg, use your right hand.  3. Keeping your knees next to each other, pull your foot toward your buttock until you feel a gentle stretch across the front of your hip and down the front of your thigh. Your knee should be pointed directly to the ground, and not out to the side.  4. Hold the stretch for at least 15 to 30 seconds.  5. Repeat 2 to 4 times.    Hamstring wall stretch   1. Lie on your back in a doorway, with your good leg through the open door.  2. Slide your affected leg up the wall to straighten your knee. You should feel a gentle stretch down the back of your leg.  1. Do not arch your back.  2. Do not bend either knee.  3. Keep one heel touching the floor and the other heel touching the wall. Do not point your toes.  3. Hold the stretch for at least 1 minute. Then over time, try to lengthen the time you hold the stretch to as long as 6 minutes.  4. Repeat 2 to 4 times.  5. If you do not have a place to do this exercise in a doorway, there is another way to do it:  6. Lie on your back, and bend your affected leg.  7. Loop a towel under the ball and toes of that foot, and hold the ends of the towel in your hands.  8. Straighten your knee, and slowly pull back on the towel. You should feel a gentle stretch down the back of your leg.  9. Hold the stretch for at least 15 to 30 seconds. Or even better, hold the stretch for 1 minute if you can.  10. Repeat 2 to 4 times.    Quad sets   1. Sit with your affected leg straight and supported on the floor or a firm bed. Place a small, rolled-up towel under your affected knee. Your other leg should  be bent, with that foot flat on the floor.  2. Tighten the thigh muscles of your affected leg by pressing the back of your knee down into the towel.  3. Hold for about 6 seconds, then rest for up to 10 seconds.  4. Repeat 8 to 12 times.    Straight-leg raises to the front   1. Lie on your back with your good knee bent so that your foot rests flat on the floor. Your affected leg should be straight. Make sure that your low back has a normal curve. You should be able to slip your hand in between the floor and the small of your back, with your palm touching the floor and your back touching the back of your hand.  2. Tighten the thigh muscles in your affected leg by pressing the back of your knee flat down to the floor. Hold your knee straight.  3. Keeping the thigh muscles tight and your leg straight, lift your affected leg up so that your heel is about 12 inches off the floor.  4. Hold for about 6 seconds, then lower your leg slowly. Rest for up to 10 seconds between repetitions.  5. Repeat 8 to 12 times.    Straight-leg raises to the back   1. Lie on your stomach, and lift your leg straight up behind you (toward the ceiling).  2. Lift your toes about 6 inches off the floor, hold for about 6 seconds, then lower slowly.  3. Do 8 to 12 repetitions.    Wall slide with ball squeeze   1. Stand with your back against a wall and with your feet about shoulder-width apart. Your feet should be about 12 inches away from the wall.  2. Put a ball about the size of a soccer ball between your knees. Then slowly slide down the wall until your knees are bent about 20 to 30 degrees.  3. Tighten your thigh muscles by squeezing the ball between your knees. Hold that position for about 10 seconds, then stop squeezing. Rest for up to 10 seconds between repetitions.  4. Repeat 8 to 12 times.    Follow-up care is a key part of your treatment and safety. Be sure to make and go to all appointments, and call your doctor if you are having problems. It's also a good idea to know your test results and keep a list of the medicines you take.  Where can you learn more?  Go to [facility] at https://myuncchart.org  [facility] under [employer]. Enter A404 in the search box to learn more about Patellofemoral Pain Syndrome (Runner's Knee): Exercises.  Current as of: February 28, 2018  Content Version: 12.3  ?? 2006-2019 [employer]. Care instructions adapted under license by [facility]. If you have questions about a medical condition or this instruction, always ask your healthcare professional. Healthwise, Incorporated disclaims any warranty or liability for your use of this information.           The patient indicates understanding of these issues and agrees to the plan as outlined above.  Contact information provided for any concerns or questions in the interim.    This note was entered by [employer], acting as scribe for [employer]. Scarlette Calico, MD, PhD  Signature: CTV  Date:09/20/18   Time: 3:31 PM    I have reviewed the documentation provided by the scribe and confirm that it accurately reflects the service I personally performed and the decisions  made by me.  Signature: Naheim Burgen C. Scarlette Calico, MD, PhD  Assistant Professor of Medicine  Department of Medicine/Division of Rheumatology  [facility]  Date: 09/20/18   Time: 3:31 PM      ??

## 2018-09-22 LAB — SERUM PROTEIN ELECTROPHORESIS AND IMMUNOFIXATION
ALBUMIN (SPE): 4 g/dL (ref 3.5–5.0)
ALPHA-1 GLOBULIN: 0.3 g/dL (ref 0.2–0.5)
ALPHA-2 GLOBULIN: 0.7 g/dL (ref 0.5–1.1)
BETA-1 GLOBULIN: 0.5 g/dL (ref 0.3–0.6)
BETA-2 GLOBULIN: 0.5 g/dL (ref 0.2–0.6)
GAMMAGLOBULIN: 1.7 g/dL — ABNORMAL HIGH (ref 0.5–1.5)

## 2018-09-22 LAB — ALBUMIN (SPE): Lab: 4

## 2018-09-25 LAB — DSDNA ANTIBODY: Lab: NEGATIVE

## 2018-09-28 ENCOUNTER — Encounter: Payer: Self-pay | Admitting: Family Medicine

## 2018-09-28 ENCOUNTER — Other Ambulatory Visit: Payer: Self-pay | Admitting: Family Medicine

## 2018-09-28 ENCOUNTER — Ambulatory Visit (INDEPENDENT_AMBULATORY_CARE_PROVIDER_SITE_OTHER): Payer: Medicare Other | Admitting: Family Medicine

## 2018-09-28 VITALS — BP 102/60 | HR 92 | Temp 97.8°F | Resp 16 | Ht 68.0 in | Wt 195.5 lb

## 2018-09-28 DIAGNOSIS — I10 Essential (primary) hypertension: Secondary | ICD-10-CM

## 2018-09-28 DIAGNOSIS — D352 Benign neoplasm of pituitary gland: Secondary | ICD-10-CM | POA: Diagnosis not present

## 2018-09-28 DIAGNOSIS — R110 Nausea: Secondary | ICD-10-CM

## 2018-09-28 DIAGNOSIS — E1169 Type 2 diabetes mellitus with other specified complication: Secondary | ICD-10-CM | POA: Diagnosis not present

## 2018-09-28 DIAGNOSIS — R198 Other specified symptoms and signs involving the digestive system and abdomen: Secondary | ICD-10-CM

## 2018-09-28 DIAGNOSIS — D353 Benign neoplasm of craniopharyngeal duct: Secondary | ICD-10-CM

## 2018-09-28 DIAGNOSIS — E669 Obesity, unspecified: Secondary | ICD-10-CM

## 2018-09-28 DIAGNOSIS — R634 Abnormal weight loss: Secondary | ICD-10-CM

## 2018-09-28 DIAGNOSIS — M3219 Other organ or system involvement in systemic lupus erythematosus: Secondary | ICD-10-CM

## 2018-09-28 LAB — POCT GLYCOSYLATED HEMOGLOBIN (HGB A1C): Hemoglobin A1C: 5.5 % (ref 4.0–5.6)

## 2018-09-28 MED ORDER — TELMISARTAN 40 MG PO TABS: 40.0000 mg | ORAL_TABLET | Freq: Every day | ORAL | 0 refills | Status: DC

## 2018-09-28 NOTE — Patient Instructions (Signed)
Stop Telmisartan/ hctz and start new rx of Telmisartan 40 mg daily  Stop Norvasc ( amlodipine ) 2.5 mg

## 2018-09-28 NOTE — Progress Notes (Signed)
Name: Nicole Ballard   MRN: [id number]    DOB: Mar 07, 1956   Date:09/30/2018       Progress Note  Subjective  Chief Complaint  Chief Complaint  Patient presents with  . Medication Refill  . Diabetes    Diet controlled  . Hypertension    Lightheaded and headaches-Emily cut her Telmisartan in half  . Depression  . Obesity    Has losted 5 more pounds since last visit    HPI  Diabetes type II: diagnosed 07/07/2017 by a hgbA1C 6.5, she has already been taking Metformin for pre-diabetes/metabolic syndrome. She has changed her diet since March 2019 and has lost almost 50  lbs since. She feels good, states SOB has decreased and is happy with results. She denies polyphagia or polyuria, mouth is always dry secondary to medication. She is now starting to get concerned about weight loss, we will stop Metformin since hgbA1C is down to 5.5% , continue a diabetic diet and monitor   Weight loss and change in bowel movement: she has a long history of constipation, but states over the past month she states symptoms are worse, bowel movements at most once a week , even on miralax she has not been able to have a bowel movements, taking 3 caps daily and sometimes has watery stools. No blood or mucus  HTN: she has been taking norvasc and telmisartan hctz, bp has been low , NP Poulose decreased dose of telmisartan hctz to half but still having dizziness and headaches, we will stop norvasc today and hctz and recheck bp  She has intermittent chest pain occasionally that is brief, no palpitation   Chronic nausea: she is now on Cellcept , and nausea is stable   Organizing pneumonia: seeing pulmonologist at [facility], she lost a lot of weight , off prednisone and off inhalers, she states occasionally has sob   SLE: going to [facility], off prednisone for the past month, she has daily joint pain but stable,  no effusion, also has fatigue.   Chronic Depression: going to [facility] and seeing therapist also, stable, not sure if  taking wellbutrin 150 or 300 mg but will verify and let us know when she gets home.   Pituitary adenoma: stable, denies diplopia. No nipple discharge. No changes. She was seen by neurologist but for mental fogginess and was advised to change antidepressants, dose recently changed by psychiatrist      Patient Active Problem List   Diagnosis Date Noted  . Hyperglycemia 10/23/2017  . Vertigo 09/29/2016  . Bronchiolitis obliterans organizing pneumonia ([place]) 09/29/2016  . Neck pain, musculoskeletal 12/15/2015  . Anxiety about health 09/29/2015  . Abnormal CAT scan 04/11/2015  . Auditory impairment 04/11/2015  . Insomnia, persistent 04/11/2015  . Chronic kidney disease (CKD), stage I 04/11/2015  . Chronic nonmalignant pain 04/11/2015  . Chronic constipation 04/11/2015  . Dyslipidemia 04/11/2015  . Fibromyalgia 04/11/2015  . Gastro-esophageal reflux disease without esophagitis 04/11/2015  . Dysphonia 04/11/2015  . Absence of bladder continence 04/11/2015  . Low back pain 04/11/2015  . Eczema intertrigo 04/11/2015  . Keratosis pilaris 04/11/2015  . Chronic recurrent major depressive disorder ([place]) 04/11/2015  . Dysmetabolic syndrome 64/40/3474  . Neurogenic claudication 04/11/2015  . Perennial allergic rhinitis with seasonal variation 04/11/2015  . Abnormal presence of protein in urine 04/11/2015  . Seborrhea capitis 04/11/2015  . Moderate dysplasia of vulva 04/11/2015  . Vitamin D deficiency 04/11/2015  . Treadmill stress test negative for angina pectoris 03/05/2015  . Shortness  of breath 05/20/2014  . Morbid obesity ([place]) 02/03/2014  . Asthma, chronic 12/31/2013  . H/O total knee replacement 12/31/2013  . Episcleritis 09/26/2013  . Vocal cord dysfunction 05/28/2013  . Anxiety, generalized 03/19/2013  . Back pain 12/18/2012  . Pulmonary nodules 11/26/2012  . Lung nodule, multiple 11/26/2012  . Arthritis, degenerative 11/01/2012  . H/O aspiration pneumonitis 10/22/2012  .  Postinflammatory pulmonary fibrosis ([place]) 10/22/2012  . Mixed incontinence 05/04/2012  . Benign neoplasm of pituitary gland and craniopharyngeal duct ([place]) 12/29/2010  . Lung involvement in systemic lupus erythematosus ([place]) 11/05/2010  . Chronic nausea 07/01/2008  . Benign hypertension 01/25/2005    Past Surgical History:  Procedure Laterality Date  . BRAIN SURGERY    . CHOLECYSTECTOMY    . VAGINAL HYSTERECTOMY      Family History  Problem Relation Age of Onset  . Breast cancer Mother   . Cancer Mother 66       Breast  . Cancer Father 89       Stomach  . Breast cancer Maternal Aunt     Social History   Socioeconomic History  . Marital status: Divorced    Spouse name: Not on file  . Number of children: 2  . Years of education: Not on file  . Highest education level: GED or equivalent  Occupational History  . Occupation: Disability  Social Needs  . Financial resource strain: Somewhat hard  . Food insecurity:    Worry: Sometimes true    Inability: Sometimes true  . Transportation needs:    Medical: No    Non-medical: No  Tobacco Use  . Smoking status: Never Smoker  . Smokeless tobacco: Never Used  Substance and Sexual Activity  . Alcohol use: No    Alcohol/week: 0.0 standard drinks  . Drug use: No  . Sexual activity: Not Currently    Partners: Male  Lifestyle  . Physical activity:    Days per week: 4 days    Minutes per session: 40 min  . Stress: Not at all  Relationships  . Social connections:    Talks on phone: Three times a week    Gets together: Never    Attends religious service: More than 4 times per year    Active member of club or organization: Yes    Attends meetings of clubs or organizations: More than 4 times per year    Relationship status: Divorced  . Intimate partner violence:    Fear of current or ex partner: No    Emotionally abused: No    Physically abused: No    Forced sexual activity: No  Other Topics Concern  . Not on file   Social History Narrative  . Not on file     Current Outpatient Medications:  .  aspirin EC 81 MG tablet, Take 1 tablet (81 mg total) by mouth daily., Disp: 30 tablet, Rfl: 0 .  atorvastatin (LIPITOR) 40 MG tablet, TAKE 1 TABLET BY MOUTH  DAILY, Disp: 90 tablet, Rfl: 1 .  buPROPion (WELLBUTRIN XL) 300 MG 24 hr tablet, Take 1 tablet by mouth daily., Disp: , Rfl:  .  calcipotriene (DOVONOX) 0.005 % ointment, APP TO RASH AREAS IN FOLDS QOD AS MAINTENANCE., Disp: , Rfl:  .  Cholecalciferol (VITAMIN D) 2000 UNITS CAPS, Take 1 capsule by mouth daily., Disp: , Rfl:  .  clobetasol ointment (TEMOVATE) 0.05 %, Apply topically., Disp: , Rfl:  .  clonazePAM (KLONOPIN) 0.5 MG tablet, Take 0.5 tablets (0.25 mg total)  by mouth daily., Disp: 15 tablet, Rfl: 0 .  cyclobenzaprine (FLEXERIL) 5 MG tablet, TAKE 1 TABLET BY MOUTH  EVERY 8 HOURS AS NEEDED FOR MUSCLE SPASMS. DO NOT DRIVE FOR 8 HOURS, Disp: 90 tablet, Rfl: 1 .  diclofenac sodium (VOLTAREN) 1 % GEL, APP 2 GRAMS EXT AA QID, Disp: , Rfl: 6 .  docusate sodium (COLACE) 100 MG capsule, Take by mouth., Disp: , Rfl:  .  DULoxetine (CYMBALTA) 60 MG capsule, Take 120 mg by mouth at bedtime. , Disp: , Rfl:  .  fluticasone (FLONASE) 50 MCG/ACT nasal spray, USE 2 SPRAYS IN EACH  NOSTRIL DAILY, Disp: 48 g, Rfl: 1 .  gabapentin (NEURONTIN) 300 MG capsule, Take 1 capsule by mouth at bedtime., Disp: , Rfl:  .  hydroxychloroquine (PLAQUENIL) 200 MG tablet, Take 200 mg by mouth 2 (two) times daily., Disp: , Rfl:  .  ketoconazole (NIZORAL) 2 % cream, Apply 1 application topically daily., Disp: 15 g, Rfl: 0 .  lamoTRIgine (LAMICTAL) 200 MG tablet, Take 1 tablet (200 mg total) by mouth 2 (two) times daily., Disp: 60 tablet, Rfl: 0 .  meclizine (ANTIVERT) 25 MG tablet, Take 1 tablet (25 mg total) by mouth 3 (three) times daily as needed., Disp: 30 tablet, Rfl: 0 .  Multiple Vitamin (MULTIVITAMIN) tablet, Take 1 tablet by mouth daily., Disp: , Rfl:  .  olopatadine  (PATANOL) 0.1 % ophthalmic solution, Place 1 drop into both eyes 2 (two) times daily., Disp: , Rfl:  .  omeprazole (PRILOSEC) 40 MG capsule, TAKE 1 CAPSULE BY MOUTH  DAILY, Disp: 90 capsule, Rfl: 1 .  ondansetron (ZOFRAN) 4 MG tablet, TAKE 1 TABLET BY MOUTH  EVERY 8 HOURS AS NEEDED FOR NAUSEA AND VOMITING, Disp: 60 tablet, Rfl: 0 .  prednisoLONE acetate (PRED FORTE) 1 % ophthalmic suspension, Place 1 drop into both eyes 2 (two) times daily as needed., Disp: , Rfl: 0 .  traZODone (DESYREL) 100 MG tablet, Take 1 tablet by mouth at bedtime., Disp: , Rfl:  .  triamcinolone ointment (KENALOG) 0.1 %, , Disp: , Rfl:  .  mycophenolate (MYFORTIC) 360 MG TBEC EC tablet, Take by mouth., Disp: , Rfl:  .  telmisartan (MICARDIS) 40 MG tablet, Take 1 tablet (40 mg total) by mouth daily., Disp: 30 tablet, Rfl: 0 .  YUVAFEM 10 MCG TABS vaginal tablet, Place 1 tablet vaginally once a week., Disp: , Rfl:   Allergies  Allergen Reactions  . Ace Inhibitors     Other reaction(s): OTHER Pt states she can not take ace inhibitors. Pt states she can not remember her reaction.   Marland Kitchen Penicillins   . Pollen Extract     I personally reviewed active problem list, medication list, allergies, family history, social history with the patient/caregiver today.   ROS  Constitutional: Negative for fever , positive for weight change.  Respiratory: Negative for cough and shortness of breath.   Cardiovascular: Negative for chest pain or palpitations.  Gastrointestinal: Negative for abdominal pain, no bowel changes.  Musculoskeletal: Negative for gait problem or joint swelling.  Skin: Negative for rash.   Neurological: Positive  for dizziness and  headache.  No other specific complaints in a complete review of systems (except as listed in HPI above).  Objective  Vitals:   09/28/18 1220  BP: 102/60  Pulse: 92  Resp: 16  Temp: 97.8 F (36.6 C)  TempSrc: Oral  SpO2: 97%  Weight: 195 lb 8 oz (88.7 kg)  Height: 5\' 8"   (  1.727 m)    Body mass index is 29.73 kg/m.  Physical Exam  Constitutional: Patient appears well-developed and well-nourished. Overweight.  No distress.  HEENT: head atraumatic, normocephalic, pupils equal and reactive to light,  neck supple, throat within normal limits Cardiovascular: Normal rate, regular rhythm and normal heart sounds.  No murmur heard. No BLE edema. Pulmonary/Chest: Effort normal and breath sounds normal. No respiratory distress. Abdominal: Soft.  There is no tenderness. Psychiatric: Patient has a normal mood and affect. behavior is normal. Judgment and thought content normal.  Recent Results (from the past 2160 hour(s))  I-STAT creatinine     Status: None   Collection Time: 09/10/18  3:37 PM  Result Value Ref Range   Creatinine, Ser 1.00 0.44 - 1.00 mg/dL  POCT HgB A1C     Status: Normal   Collection Time: 09/28/18  1:06 PM  Result Value Ref Range   Hemoglobin A1C 5.5 4.0 - 5.6 %   HbA1c POC (<> result, manual entry)     HbA1c, POC (prediabetic range)     HbA1c, POC (controlled diabetic range)       PHQ2/9: Depression screen [facility] 2/9 09/28/2018 09/11/2018 09/06/2018 05/14/2018 04/23/2018  Decreased Interest 0 0 0 1 0  Down, Depressed, Hopeless 0 0 0 2 0  PHQ - 2 Score 0 0 0 3 0  Altered sleeping 1 0 0 2 -  Tired, decreased energy 2 0 0 2 -  Change in appetite 0 0 0 0 -  Feeling bad or failure about yourself  0 0 0 1 -  Trouble concentrating 1 0 0 1 -  Moving slowly or fidgety/restless 1 0 0 0 -  Suicidal thoughts 0 0 0 0 -  PHQ-9 Score 5 0 0 9 -  Difficult doing work/chores Somewhat difficult Not difficult at all Not difficult at all Somewhat difficult -    Fall Risk: Fall Risk  09/28/2018 09/11/2018 09/06/2018 07/02/2018 05/14/2018  Falls in the past year? 1 1 1  No Yes  Number falls in past yr: 1 1 0 - 2 or more  Comment - - - - Dizziness  Injury with Fall? 0 0 0 - No  Risk for fall due to : Impaired balance/gait - - - -  Risk for fall due to: Comment  Vertigo - - - -    Assessment & Plan  1. Diabetes mellitus type 2 in obese (HCC)  - POCT HgB A1C  2. Change in bowel movement  Discussed going back to see GI and consider colonoscopy   3. Excessive body weight loss  50 lbs in the past year , with life style modification however also having change in bowel movements and needs to follow up with GI   4. Benign neoplasm of pituitary gland and craniopharyngeal duct ([place])  Seen by neurologist  5. Chronic nausea  Under the care of GI   6. Benign hypertension  Stop norvasc and HCTZ, continue telmisartan and return in one week for bp check and 1 month for follow up  - telmisartan (MICARDIS) 40 MG tablet; Take 1 tablet (40 mg total) by mouth daily.  Dispense: 30 tablet; Refill: 0  7. Systemic lupus erythematosus with other organ involvement, unspecified SLE type [facility])  Sees Rheumatologist

## 2018-10-01 NOTE — Unmapped (Signed)
Patient left VM requesting refill for Klonopin.   Rx of 30 day supply for Klonopin written 12/31 with two refills.  Called pharmacy who said that refills were recaived and prescription was ready for pick-up.  Called patient- patient aware.

## 2018-10-05 ENCOUNTER — Ambulatory Visit: Payer: Medicare Other
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
## 2018-10-08 ENCOUNTER — Encounter: Admit: 2018-10-08 | Discharge: 2018-10-08 | Payer: MEDICARE | Attending: Psychologist | Primary: Psychologist

## 2018-10-08 ENCOUNTER — Encounter: Admit: 2018-10-08 | Discharge: 2018-10-08 | Payer: MEDICARE

## 2018-10-08 DIAGNOSIS — F4310 Post-traumatic stress disorder, unspecified: Secondary | ICD-10-CM

## 2018-10-08 DIAGNOSIS — F331 Major depressive disorder, recurrent, moderate: Secondary | ICD-10-CM

## 2018-10-08 DIAGNOSIS — F410 Panic disorder [episodic paroxysmal anxiety] without agoraphobia: Secondary | ICD-10-CM

## 2018-10-08 DIAGNOSIS — F411 Generalized anxiety disorder: Principal | ICD-10-CM

## 2018-10-08 NOTE — Unmapped (Signed)
The patient was a no show for his/her return patient evaluation at the [facility].
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
## 2018-10-09 ENCOUNTER — Ambulatory Visit: Payer: Medicare Other
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
## 2018-10-15 ENCOUNTER — Telehealth: Payer: Self-pay | Admitting: Family Medicine

## 2018-10-15 ENCOUNTER — Encounter: Admit: 2018-10-15 | Discharge: 2018-10-16 | Payer: MEDICARE

## 2018-10-15 DIAGNOSIS — L408 Other psoriasis: Secondary | ICD-10-CM | POA: Diagnosis not present

## 2018-10-15 DIAGNOSIS — M3219 Other organ or system involvement in systemic lupus erythematosus: Secondary | ICD-10-CM | POA: Diagnosis not present

## 2018-10-15 DIAGNOSIS — L731 Pseudofolliculitis barbae: Secondary | ICD-10-CM | POA: Diagnosis not present

## 2018-10-15 MED ORDER — CLOBETASOL 0.05 % TOPICAL OINTMENT
5 refills | 0 days | Status: CP
Start: 2018-10-15 — End: ?

## 2018-10-15 NOTE — Telephone Encounter (Signed)
I called the patient to schedule Medicare AWV-S with Nurse Health Advisor, Nicole Ballard.  She said that she normally gets them done at home with [facility].  I told her that we would prefer that she come into the office for AWV so that we will have a record of her information.  She said that she will call back once she gets home. VDM (DD)

## 2018-10-16 NOTE — Unmapped (Signed)
Dermatology Note    A/P:    Inverse psoriasis - typical areas involved of gluteal cleft, lower back, right axilla, inframammary skin, umbilicus, infrapannus -- flaring today, likely in setting of recent steroid taper for SLE  - Would continue try to avoid prednisone in the future if possible as this can flare psoriasis.   - Will continue to treat with topicals below.   - Apply clobetasol (TEMOVATE) 0.05 % ointment BID until next visit with Dr. Jettie Booze given current flare. Discussed appropriate use of topical corticosteroids and side effects including skin atrophy, striae, and hypertrichosis. Plan to taper to TAC ointment if well controlled at next visit.  - Consider adding an oral agent if she continues to flare at next visit- could consider Otezla. Will perform punch biopsy for definitive diagnosis of psoriasis today as below. Of note, she is not a good candidate for MTX given history of eosinophilic pneumonitis- rheumatology and pulmonology agree that this would not be a suitable option for her given her pulmonary history.    Punch Biopsy Procedure Note:  Number of punch biopsies performed: 1 on back  After verbal consent was obtained, the area was prepped with alcohol and anesthetized with 2% lidocaine with epinephrine. Biopsy was performed using a 4-mm punch technique and hemostasis was achieved using 4-0 nylon suture(s). The area was then dressed with petroleum jelly and a bandage. The patient was instructed on wound care and given a handout with this information. The patient will be notified of the pathology results.    Pseudofolliculitis barbae  - In past, has Nd-Yag laser with Dr. Janyth Contes   - Discussed use of OTC nair products for few stubborn areas   - No active acne like lesions today, will hold on clindamycin   - Discussed vaniqua, but patient did not pursue 2/2 cost     RTC Already scheduled with Dr. Jettie Booze in 2/21    CC:  Psoriasis flare    HPI:  Caitlin Smith is a friendly 63 y.o. year old female last seen by Dr. Jettie Booze. At last visit, she was continued on clobetasol and triamcinolone for flares and calcipotriene for maintenance of inverse psoriasis.     Today she reports flaring of psoriasis in the past several weeks. Of note, recently tapered off prednisone for her lupus in December. Has been applying TAC daily to the affected areas. Only has used clobetasol for 3-4 day stretches due to concern about side effects. Worst areas include umbilicus, groin area, and inframammary. Associated with pruritus.     No other lesions that were painful, bleeding, growing or concerning.     Pertinent PMH:   No history of skin cancer   SLE   Eosinophilic pneumonitis   Inverse Psoriasis     FH:   No family history of melanoma     SH:   Lives in [place]     ROS:  + diffuse joint pains. All other systems reviewed are negative.     PE:  General: Friendly and conversational female in no distress, resting comfortably.  Neuro: Alert and oriented x 3, answering questions appropriately.  Skin:  Examination by inspection and palpation of the scalp, face, neck, chest, back, abdomen, BUE, BLE, genitals, and buttocks was performed and notable for the following:  - Shiny erythematous to hyperpigmented thin papules and plaques in the inframammary, umbilical, inguinal folds and suprapubic region  - Scaly erythematous papules and plaques in conchal bowls and postauricular creases  - All other areas examined  were normal or had no significant findings.

## 2018-10-16 NOTE — Unmapped (Signed)
Punch biopsy  Punch biopsy involves numbing a small area of your skin, then obtaining a sample to help [place] make a proper diagnosis of your skin condition. The biopsy site is typically closed with one to 3 small stitches to help the site heal. Biopsy results will usually return in 7-14 days.    To care for the area: Leave the bandage in place until the morning after your procedure is performed. On a daily basis, carefully remove the bandage, then shower or wash as usual. Allow water to run over the site. Please do not scrub. Carefully dry the area, then apply ointment (some people develop an allergy to Neosporin, so we recommend Vaseline or Aquaphor). Cover the site with a fresh bandage. Should any bleeding occur, apply firm pressure for 15 minutes. The treated site will heal best if  a scab never forms (the wound heals by new skin cells traveling from the outside toward the middle-their journey is easier if no scab stands in their way).    Long-term care: the site will be more sensitive than your surrounding skin. Keep it covered, and remember to apply sunscreen every day to all your exposed skin. A scar may remain which is lighter or pinker than your normal skin. Your body will continue to improve your scar for up to one year.    Infection following this procedure is rare. However, if you are worried about the appearance of your site, contact your doctor. Complete healing of the site may take up to one month. We have a physician on call at all times. If you have any concerns about the site, please call our clinic at [phone].    Your stitches should be removed in 1 to 2 weeks.

## 2018-10-18 NOTE — Unmapped (Signed)
Final Diagnosis       Date                     Value               Ref Range           Status                10/15/2018                                                       Final             Back, punch - Psoriasis [FORMATTING REMOVED]  ----------   Notified pt of result via MyChart- discussed that it showed psoriasis, as expected.

## 2018-10-22 NOTE — Unmapped (Signed)
Dermatology Note    A/P:    Inverse psoriasis - typical areas involved of gluteal cleft, lower back, right axilla, inframammary skin, umbilicus, infrapannus --  Now improved since prednisone taper and use of clobetasol   - Biopsy at [facility] was c/w psoriasis  - Would continue try to avoid prednisone in the future if possible as this can flare psoriasis.   - Will continue to treat with topicals below.   - Switch back to TAC ointment BID   - Hold clobetasol for now   - Consider adding an oral agent if she continues to flare at next visit- could consider Otezla.   - Of note, she is not a good candidate for MTX given history of eosinophilic pneumonitis- rheumatology and pulmonology agree that this would not be a suitable option for her given her pulmonary history.    RTC: 3 months     CC:  Psoriasis flare, improving     HPI:  Caitlin Smith is a friendly 63 y.o. year old female last seen by Dr. Loa Socks on 10/15/18 at which time a punch biopsy was obtained on the back that was c/w psoriasis. She was flaring in setting of tapering prednisone for her lupus in December.  She was told to use clobetasol until this visit. Worst areas include umbilicus, groin area, and inframammary. Associated with pruritus. Today, she is much improved and is happy with current control.      Final Diagnosis   Date Value Ref Range Status   10/15/2018   Final    Back, punch  - Psoriasis         No other lesions that were painful, bleeding, growing or concerning.     Pertinent PMH:   No history of skin cancer   SLE   Eosinophilic pneumonitis   Inverse Psoriasis     FH:   No family history of melanoma     SH:   Lives in [place]     ROS:  + diffuse joint pains. All other systems reviewed are negative.     PE:  General: Friendly and conversational female in no distress, resting comfortably.  Neuro: Alert and oriented x 3, answering questions appropriately.  Skin:  Examination by inspection and palpation of the scalp, face, neck, chest, back, abdomen, BUE, BLE, genitals, and buttocks was performed and notable for the following:  - Shiny erythematous to hyperpigmented thin papules and plaques in the inframammary, umbilical, inguinal folds and suprapubic region, much improved from prior with more hyperpigmentation today   - All other areas examined were normal or had no significant findings.

## 2018-10-23 ENCOUNTER — Ambulatory Visit
Admit: 2018-10-23 | Discharge: 2018-10-24 | Payer: MEDICARE | Attending: Student in an Organized Health Care Education/Training Program | Primary: Student in an Organized Health Care Education/Training Program

## 2018-10-23 DIAGNOSIS — R413 Other amnesia: Secondary | ICD-10-CM

## 2018-10-23 DIAGNOSIS — G4700 Insomnia, unspecified: Secondary | ICD-10-CM

## 2018-10-23 DIAGNOSIS — F411 Generalized anxiety disorder: Secondary | ICD-10-CM

## 2018-10-23 DIAGNOSIS — F329 Major depressive disorder, single episode, unspecified: Principal | ICD-10-CM

## 2018-10-23 NOTE — Unmapped (Addendum)
Hi Ms. Sweaney,     It was a pleasure to meet you today!  We talked about your medications today and decided to keep things the same, including taking 1 wellbutrin (150mg ) each day.     We also discussed MELATONIN.  This medication can be found at a grocery store or pharmacy over-the-counter. If you have trouble finding it you can ask a pharmacist to help you!  A good starting dose is 3mg , and you should take it at around 6pm.     Finally, we talked about memory.  We feel like, in general, it is related to lupus.  We discussed WRITING DOWN the hours that you sleep, that is, when you go to bed and when you wake up in the morning. We also talked about writing down lists while you're at home to help jog your memory.     Here is my voicemail: [phone]    Here are some general instructions:     Follow-up instructions:  -- Please continue taking your medications as prescribed for your mental health.   -- Do not make changes to your medications, including taking more or less than prescribed, unless under the supervision of your physician. Be aware that some medications may make you feel worse if abruptly stopped  -- Please refrain from using illicit substances, as these can affect your mood and could cause anxiety or other concerning symptoms.   -- Seek further medical care for any increase in symptoms or new symptoms such as thoughts of wanting to hurt yourself or hurt others.     Contact info:  Life-threatening emergencies: call 911 or go to the nearest ER for medical or psychiatric attention.     Issues that need urgent attention but are not life threatening: call the clinic outpatient frontdesk at [phone] for assistance.     Non-urgent routine concerns, questions, and refill requests: please leave me a voicemail at [phone] and I will get back to you within 2 business days.     Regarding appointments:  - If you need to cancel your appointment, we ask that you call [phone] at least 24 hours before your scheduled appointment.  - If for any reason you arrive 15 minutes later than your scheduled appointment time, you may not be seen and your visit may be rescheduled.  - Please remember that we will not automatically reschedule missed appointments.  - If you miss two (2) appointments without letting us know in advance, you will likely be referred to a provider in your community.  - We will do our best to be on time. Sometimes an emergency will arise that might cause your clinician to be late. We will try to inform you of this when you check in for your appointment. If you wait more than 15 minutes past your appointment time without such notice, please speak with the front desk staff.    In the event of bad weather, the clinic staff will attempt to contact you, should your appointment need to be rescheduled. Additionally, you can call the Patient Weather Line [phone] for system-wide clinic status    For more information and reminders regarding clinic policies (these were provided when you were admitted to the clinic), please ask the front desk.

## 2018-10-23 NOTE — Unmapped (Signed)
[facility]  Psychiatry   Established Patient E&M Service     Assessment:  Caitlin Smith is a 63 y.o. female with a history of MDD, GAD, and Panic Disorder in the context of many chronic medical conditions including chronic pain/fibromyalgia, osteoarthritis with degenerative disc disease, Lupus, pituitary tumor (benign), s/p right total knee arthroplasty, and VIN II (s/p resection), who presents for follow-up evaluation. She has been maintained on Lamictal for many years as well as cymbalta, trazodone, klonopin, and wellbutrin. Patient has previously tolerated a slight taper in her klonopin from 0.75 mg to 0.25 mg qHS overtime. Her mood is often exacerbated by steroid use for current treatment of pulmonary disease. Patient is engaged with CBT therapy at the pain clinic.    Today, 10/23/18, patient presents with stable mood and affect, but continues to report memory difficulties as well as insomnia and some generalized anxiety. Wellbutrin had been decreased in October on recommendation of [facility] to help simplify psychopharm regimen given dx of mild cognitive impairment. Patient has not noticed any worsening in mood/anxiety since decreasing Wellbutrin, but also has not noticed any improvement in sleep or cognition. Discussed continued tapering/discontinuation of  medications due to concern for polypharmacy. Patient agreeable to continue slow taper of wellbutrin, and would like to wait and stay on 150mg  this next month, with understanding that she should call with any worsening of mood/anxiety as this would be indication to go back up on dose. Discussed klonopin and patient feels that it is important in her regimen since she believes it got her off the mountaintop of anxiety; therefore, she hopes to stay on it or discontinue it at a later date when she feels better equipped psychologically. hile benzodiazepines carry long-term risk of worsening cognitive function, 0.25mg  is a small dose and any attempt to discontinue would be another risks/benefits discussion. RTC in 4-6 weeks.    Risk Assessment:  A suicide and violence risk assessment was performed as part of this evaluation. There patient is deemed to be at chronic elevated risk for self-harm/suicide given the following factors: divorced, recent onset of serious medical condition, current diagnosis of depression, chronic severe medical condition and chronic mental illness > 5 years. The patient is deemed to be at chronic elevated risk for violence given the following factors: N/A. These risk factors are mitigated by the following factors:lack of active SI/HI, no know access to weapons or firearms, no history of violence, motivation for treatment, utilization of positive coping skills, supportive family, sense of responsibility to family and social supports, religious or spiritual prohibition to suicide/violence, current treatment compliance, safe housing and presence of a safety plan with follow-up care. There is no acute risk for suicide or violence at this time. The patient was educated about relevant modifiable risk factors including following recommendations for treatment of psychiatric illness and abstaining from substance abuse.    While future psychiatric events cannot be accurately predicted, the patient does not currently require acute inpatient psychiatric care and does not currently meet [facility] involuntary commitment criteria.      Stressors: chronic medical illness including chronic pain, lupus, physical limitations     Plan:  # Depression - Anxiety - Panic Disorder  - Continue Lamictal 400mg  qhs for mood  - Continue Cymbalta 120mg  qAM to target depressive sx and chronic pain  - Continue Wellbutrin XL to 150mg  qAM (dec 12/31 ) given polypharmacy and insomnia. May consider discontinuation at next visit. Continued on this dose per  patients wishes at this time and in the context of new resident and building rapport  - Continue Clonazepam 0.25mg  qHS, appropriate filling per NCCSRS  - Continue CBT at pain clinic    # Insomnia: Last sleep study performed in 2017. Per chart review, showed mild OSA likely related to medication use. Neurology recommends patient avoid sleeping on back as she likely has OSA when sleeping in this position  - Continue Trazodone 200mg  qHS for insomnia. May plan to discontinue in future if insomnia improves after discontinuation of Wellbutrin and initiation of melatonin.   - START OTC melatonin q1800 Patient reports 2/18 not having obtained this as she was unsure of dose; discussed where to find and to ask pharmacist for help if needed  - Taper Wellbutrin per above  - Clonazepam per above  - Patient provided with sleep hygiene worksheet 09/2018; discussed again 2/18    # Mild Neurocognitive Impairment: Patient seen by Neurology ([facility]) 05/22/18 who dx pt with mild neurocognitive impairment w/ deficits in inattention and visual-spatial function with suspicion that sleep apnea and polypharmacy may be contributing to her difficulties. Recommended that psychopharm regimen be simplified, including eliminating trazodone and considering changing Wellbutrin to Zoloft or Lexapro for anxiety and eliminating Klonopin when stable.  - Plan for gradual taper of Wellbutrin per above  - Will consider taper/discontinuation of Trazodone vs Klonopin next if sleep improves after above changes  - However, it is very likely that patient's lupus is significantly contributing to cognitive decline, which will limit improvement she will see with reduction in polypharmacy  --Last MOCA 22 on 12/12/2017. Repeat at next visit.     # Medical Monitoring - High Risk Medication Use  - The complexity of this patient's care involves drug therapy which requires intensive monitoring for toxicity. This is in part due to a narrow therapeutic window, as well as potential for toxicity. This patient is being treated with Lamotrigine (Lamictal) to target their psychiatric disorder, Major depressive disorder, refractory. In regards to this medication being considered high risk, Lamotrigine (lamictal) has black box warning for serious rash including fatal reaction of Stevens-Johnson syndrome and rare cases of toxic epidermal necrolysis. In addition to prolonged titration to mitigate risk of serious rash, lamotrigine requires monitoring of hepatic and renal function at baseline, and at times will require monitoring of lamotrigine blood levels.    AST   Date Value Ref Range Status   09/20/2018 27 14 - 38 U/L Final   11/05/2014 32 14 - 38 U/L Final     ALT   Date Value Ref Range Status   09/20/2018 15 <35 U/L Final   11/05/2014 21 15 - 48 U/L Final     Platelet   Date Value Ref Range Status   09/20/2018 369 150 - 440 10*9/L Final   11/05/2014 304 150 - 440 10*9/L Final      Lamotrigine Monitoring  - Hepatic function testing, platelets (via CBC) q6 months   - Last obtained 09/2018   - Due 02/2019    # Return to Clinic: 4-6 weeks    Psychotherapy:  No billable psychotherapy service provided but brief supportive therapy was utilized.    Patient has been given this writer's contact information as well as the [facility] Psychiatry urgent line number. The patient has been instructed to call 911 for emergencies.    Patient was seen and plan of care was discussed with the Attending MD, Dr. Evette Cristal, who agrees with the above statement and plan.  Posey Rea, MD    Subjective:     Psychiatric Chief Concern:  Follow-up psychiatric evaluation for MDD, GAD    Interval History:  Patient presents to appointment on time and unaccompanied.  She states that she is doing good but then later says she is feling down and anxious. She says that lately she is sleeping better than she had been. She reports utilizing her group skills when she's down, and makes mention of the talkative parrot which is how she envisions her negative, depressive self-talk.  Discussed anxiety and depressive symptoms. As previously documented, she considers her most suicidal points her mountaintop; Ms. Winkels states that I would do my skills first but would call if I were on that mountaintop.  She indicates that if she felt suicidal or felt that she was on the way to being in that state of mind, she would reach out to family or the suicide hotline. She says she knows that she is in this state of mind when she doesn't enjoy her grandkids anymore.     With respect to her memory, she feels that it is terrible.  She will frequently lose her car in a parking lot or forget the choreography she is learning in dance class. She denies forgetting to turn off the stove or microwave, and denies forgetting to flush the toilet. She endorses remembering how to use a cell phone and her TV remote. Talked about writing notes to jog memory throughout the day.     Also discussed sleep. She says she still wakes up several times throughout the night. She's not sure how many hours she sleeps. The last couple days she feels like if she doesn't get some coffee in the am, she will fall back asleep in the morning. She says that falling asleep is more difficult than staying asleep. Falling asleep is worse. By the time I fall asleep it might be eleven (that's early) but then during the night I still wake up.      NCCSRS: Reviewed; Appropriate filling. Since last visit:    Days Supply Quantity Provider Pharmacy     CLONAZEPAM 0.5MG  TABLETS 09/24/2018 30 15 each [facility]   CLONAZEPAM 0.5MG  TABLETS 08/22/2018 30 15 each [facility]...   CLONAZEPAM 0.5MG  TABLETS 07/23/2018 30 15 each [facility]...         Social History: reviewed; pertinents have been documented in the interval history section.  She has a son who lives in [place] and a daughter in [place] who are both supportive. +several grandchildren    ROS:  As per Interval History and: Constitutional: +memory deficits  Neuro: +lightheadedness/dizziness on standing    Objective:    Mental Status Exam:  Appearance:    Appears stated age, Well nourished and Clean/Neat   Motor:   No abnormal movements, makes good eye contact   Speech/Language:    Normal rate, volume, tone, fluency and Language intact, well formed   Mood:  good and later down and anxious   Affect:   Calm, Cooperative and Euthymic, gleeful upon seeing attending physician   Thought process and Associations:   Logical, linear, clear, coherent, goal directed   Abnormal/psychotic thought content:     Denies SI, HI, self harm, delusions, obsessions, paranoid ideation, or ideas of reference    Perceptual disturbances:     Denies auditory and visual hallucinations, behavior not concerning for response to internal stimuli  Orientation:   Oriented to person, place, time, and general circumstances   Attention and Concentration:   Able to fully concentrate and attend   Memory:   Immediate, short-term, long-term, and recall grossly intactupon interview Patient reports difficulty at home with memory    Fund of knowledge:    Consistent with level of education and development   Insight:     Fair   Judgment:    Fair   Impulse Control:   Fair       Medications: reviewed at today's visit     Vitals:   Vitals:    10/23/18 0826   BP: 138/76   Pulse: 72     Vitals:    10/23/18 0826   Weight: 89.2 kg (196 lb 10.4 oz)       PE: Completed 10/23/18 and same as prior  Vital signs were reviewed.      General: No acute distress  Pulm: Non-labored respirations  Neuro: Spontaneously moving all 4 limbs without obvious deficits.  Gait and station assessed with no abnormalities    Psychometrics:  MOCA 22/30 on 12/12/17  Missed points for: Visuospatial (unable to complete pattern or copy cube), Serial 7's (able to only get 93 and 86), Language (difficulty with naming words that begin with F), Abstraction (unable to state how watch and ruler similar), and Delayed recall (2/5 on first attempt but got all 5 after category clue was given)    PHQ-9 PHQ-9 TOTAL SCORE   09/04/2018 20   05/30/2017 20   01/31/2017 8   12/27/2016 17   10/25/2016 12     Connye Burkitt, MD  10/23/2018

## 2018-10-26 ENCOUNTER — Encounter: Admit: 2018-10-26 | Discharge: 2018-10-27 | Payer: MEDICARE | Attending: Dermatology | Primary: Dermatology

## 2018-10-26 DIAGNOSIS — L408 Other psoriasis: Secondary | ICD-10-CM | POA: Diagnosis not present

## 2018-10-26 MED ORDER — TRIAMCINOLONE ACETONIDE 0.1 % TOPICAL OINTMENT
0 refills | 0 days | Status: CP
Start: 2018-10-26 — End: ?

## 2018-10-26 NOTE — Unmapped (Signed)
Hello, It was nice seeing you today.     Let's try the following plan:     - We will take the stitch out today and the spot should heal.   - Use the triamcinolone ointment (in a jar) as needed for rash now  - Save the clobetasol one (stronger) for when rash is really bad like before   - If no rash, but discolored then just use that gold bond moisturizer     If you have any questions, please let us know.

## 2018-10-28 ENCOUNTER — Other Ambulatory Visit: Payer: Self-pay | Admitting: Family Medicine

## 2018-10-28 DIAGNOSIS — I10 Essential (primary) hypertension: Secondary | ICD-10-CM

## 2018-10-29 NOTE — Unmapped (Signed)
I saw and evaluated the patient, participating in the key portions of the service.  I reviewed the resident’s note.  I agree with the resident’s findings and plan. Rebbeca Sheperd J Callee Rohrig, MD

## 2018-11-01 ENCOUNTER — Telehealth: Payer: Self-pay | Admitting: Family Medicine

## 2018-11-01 NOTE — Telephone Encounter (Signed)
Was just sent in a 30 day supply on 10/28/2018 to [facility] pharmacist also states she has her last prescription there ready to be picked up that is a 90 day supply. Patient mailbox is full. Please inform her to pick up her Telmisartan at [facility].

## 2018-11-01 NOTE — Telephone Encounter (Signed)
Tried calling pt also to inform her of her medications. No answer and mailbox is full.

## 2018-11-01 NOTE — Telephone Encounter (Signed)
Pt needs refill on Telmisartan 40 mg to be sent to [facility] on [address] only has one left

## 2018-11-06 DIAGNOSIS — F329 Major depressive disorder, single episode, unspecified: Principal | ICD-10-CM

## 2018-11-06 DIAGNOSIS — F411 Generalized anxiety disorder: Principal | ICD-10-CM

## 2018-11-06 MED ORDER — LAMOTRIGINE 200 MG TABLET
ORAL_TABLET | Freq: Two times a day (BID) | ORAL | 2 refills | 0.00000 days | Status: CP
Start: 2018-11-06 — End: 2019-03-20

## 2018-11-06 MED ORDER — BUPROPION HCL XL 150 MG 24 HR TABLET, EXTENDED RELEASE: 150 mg | tablet | Freq: Every day | 2 refills | 0 days | Status: AC

## 2018-11-06 MED ORDER — TRAZODONE 100 MG TABLET: 200 mg | tablet | Freq: Every evening | 2 refills | 0 days | Status: AC

## 2018-11-06 MED ORDER — BUPROPION HCL XL 150 MG 24 HR TABLET, EXTENDED RELEASE
ORAL_TABLET | Freq: Every day | ORAL | 2 refills | 0.00000 days | Status: CP
Start: 2018-11-06 — End: 2019-02-04

## 2018-11-06 MED ORDER — LAMOTRIGINE 200 MG TABLET: 200 mg | tablet | Freq: Two times a day (BID) | 2 refills | 0 days | Status: AC

## 2018-11-06 MED ORDER — DULOXETINE 60 MG CAPSULE,DELAYED RELEASE: 120 mg | capsule | Freq: Every day | 2 refills | 0 days | Status: AC

## 2018-11-06 MED ORDER — TRAZODONE 100 MG TABLET
ORAL_TABLET | Freq: Every evening | ORAL | 2 refills | 0.00000 days | Status: CP
Start: 2018-11-06 — End: 2019-03-20

## 2018-11-06 MED ORDER — DULOXETINE 60 MG CAPSULE,DELAYED RELEASE
ORAL_CAPSULE | Freq: Every day | ORAL | 2 refills | 0.00000 days | Status: CP
Start: 2018-11-06 — End: 2018-11-06

## 2018-11-06 NOTE — Unmapped (Signed)
Spoke with [name] this afternoon regarding prescription refills.  She requests bupropion, duloxetine, trazodone and lamotrigine be sent as 90 day supply to [facility].   Aforementioned medications were prescribed and sent to [facility] for 90 day supply with 2 refills.     -Caitlin Burkitt, MD

## 2018-11-07 ENCOUNTER — Ambulatory Visit: Payer: Medicare Other | Admitting: Family Medicine

## 2018-11-12 ENCOUNTER — Ambulatory Visit: Admit: 2018-11-12 | Discharge: 2018-11-13 | Payer: MEDICARE | Attending: Nephrology | Primary: Nephrology

## 2018-11-12 DIAGNOSIS — N181 Chronic kidney disease, stage 1: Secondary | ICD-10-CM | POA: Diagnosis not present

## 2018-11-12 DIAGNOSIS — M329 Systemic lupus erythematosus, unspecified: Secondary | ICD-10-CM | POA: Diagnosis not present

## 2018-11-12 DIAGNOSIS — I129 Hypertensive chronic kidney disease with stage 1 through stage 4 chronic kidney disease, or unspecified chronic kidney disease: Secondary | ICD-10-CM | POA: Diagnosis not present

## 2018-11-12 DIAGNOSIS — I10 Essential (primary) hypertension: Principal | ICD-10-CM

## 2018-11-12 DIAGNOSIS — N901 Moderate vulvar dysplasia: Principal | ICD-10-CM

## 2018-11-12 DIAGNOSIS — J984 Other disorders of lung: Principal | ICD-10-CM

## 2018-11-12 DIAGNOSIS — G894 Chronic pain syndrome: Principal | ICD-10-CM

## 2018-11-12 DIAGNOSIS — M797 Fibromyalgia: Principal | ICD-10-CM

## 2018-11-12 DIAGNOSIS — IMO0002 Degenerative disc disease: Principal | ICD-10-CM

## 2018-11-12 DIAGNOSIS — K649 Unspecified hemorrhoids: Principal | ICD-10-CM

## 2018-11-12 DIAGNOSIS — F329 Major depressive disorder, single episode, unspecified: Principal | ICD-10-CM

## 2018-11-12 DIAGNOSIS — N189 Chronic kidney disease, unspecified: Principal | ICD-10-CM

## 2018-11-12 DIAGNOSIS — E669 Obesity, unspecified: Principal | ICD-10-CM

## 2018-11-12 DIAGNOSIS — L309 Dermatitis, unspecified: Principal | ICD-10-CM

## 2018-11-12 DIAGNOSIS — Z803 Family history of malignant neoplasm of breast: Principal | ICD-10-CM

## 2018-11-12 DIAGNOSIS — F411 Generalized anxiety disorder: Principal | ICD-10-CM

## 2018-11-12 DIAGNOSIS — H905 Unspecified sensorineural hearing loss: Principal | ICD-10-CM

## 2018-11-12 DIAGNOSIS — R51 Headache: Principal | ICD-10-CM

## 2018-11-12 DIAGNOSIS — M1990 Unspecified osteoarthritis, unspecified site: Principal | ICD-10-CM

## 2018-11-12 DIAGNOSIS — J383 Other diseases of vocal cords: Principal | ICD-10-CM

## 2018-11-12 DIAGNOSIS — E1169 Type 2 diabetes mellitus with other specified complication: Principal | ICD-10-CM

## 2018-11-12 DIAGNOSIS — D352 Benign neoplasm of pituitary gland: Principal | ICD-10-CM

## 2018-11-12 DIAGNOSIS — K219 Gastro-esophageal reflux disease without esophagitis: Principal | ICD-10-CM

## 2018-11-12 DIAGNOSIS — E1159 Type 2 diabetes mellitus with other circulatory complications: Principal | ICD-10-CM

## 2018-11-12 DIAGNOSIS — J45909 Unspecified asthma, uncomplicated: Principal | ICD-10-CM

## 2018-11-12 DIAGNOSIS — F410 Panic disorder [episodic paroxysmal anxiety] without agoraphobia: Principal | ICD-10-CM

## 2018-11-12 DIAGNOSIS — K5900 Constipation, unspecified: Principal | ICD-10-CM

## 2018-11-12 LAB — PROTEIN / CREATININE RATIO, URINE: PROTEIN/CREAT RATIO, URINE: 0.073 mg/dL

## 2018-11-12 MED ORDER — AMLODIPINE 5 MG TABLET
ORAL_TABLET | Freq: Every day | ORAL | 3 refills | 0.00000 days | Status: CP
Start: 2018-11-12 — End: ?
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
## 2018-11-20 ENCOUNTER — Other Ambulatory Visit: Payer: Self-pay

## 2018-11-20 ENCOUNTER — Encounter: Payer: Self-pay | Admitting: Family Medicine

## 2018-11-20 ENCOUNTER — Ambulatory Visit (INDEPENDENT_AMBULATORY_CARE_PROVIDER_SITE_OTHER): Payer: Medicare Other | Admitting: Family Medicine

## 2018-11-20 VITALS — BP 128/82 | HR 88 | Temp 98.2°F | Resp 16 | Ht 68.0 in | Wt 196.9 lb

## 2018-11-20 DIAGNOSIS — R05 Cough: Secondary | ICD-10-CM

## 2018-11-20 DIAGNOSIS — J302 Other seasonal allergic rhinitis: Secondary | ICD-10-CM | POA: Diagnosis not present

## 2018-11-20 DIAGNOSIS — M797 Fibromyalgia: Secondary | ICD-10-CM

## 2018-11-20 DIAGNOSIS — R110 Nausea: Secondary | ICD-10-CM

## 2018-11-20 DIAGNOSIS — J3089 Other allergic rhinitis: Secondary | ICD-10-CM | POA: Diagnosis not present

## 2018-11-20 DIAGNOSIS — R059 Cough, unspecified: Secondary | ICD-10-CM

## 2018-11-20 DIAGNOSIS — M3219 Other organ or system involvement in systemic lupus erythematosus: Principal | ICD-10-CM

## 2018-11-20 MED ORDER — MYCOPHENOLATE SODIUM 360 MG TABLET,DELAYED RELEASE
ORAL | 3 refills | 0 days | Status: CP
Start: 2018-11-20 — End: 2019-11-20
  Filled 2018-11-22: qty 180, 90d supply, fill #0

## 2018-11-20 MED ORDER — BENZONATATE 100 MG PO CAPS: 100.0000 mg | ORAL_CAPSULE | Freq: Three times a day (TID) | ORAL | 0 refills | Status: DC | PRN

## 2018-11-20 MED ORDER — OLOPATADINE HCL 0.1 % OP SOLN: 1.0000 [drp] | Freq: Two times a day (BID) | OPHTHALMIC | 3 refills | Status: DC

## 2018-11-20 MED ORDER — FEXOFENADINE HCL 180 MG PO TABS: 180.0000 mg | ORAL_TABLET | Freq: Every day | ORAL | 1 refills | Status: DC

## 2018-11-20 MED ORDER — FLUTICASONE PROPIONATE 50 MCG/ACT NA SUSP: 2.0000 | Freq: Every day | NASAL | 3 refills | Status: DC

## 2018-11-20 NOTE — Progress Notes (Signed)
Name: Nicole Ballard   MRN: [id number]    DOB: 18-Jul-1956   Date:11/20/2018       Progress Note  Subjective  Chief Complaint  Chief Complaint  Patient presents with  . Allergic Rhinitis   . URI    sneezing, nausea, headache    HPI  Pt presents with concern for upper respiratory symptoms and body aches for about a week.  She has had some mild nausea, sneezing, mild cough, eyes are watery and sometimes have dried exudate in the mornings. No vomiting/abdominal pain/diarrhea.  Body aches - she has SLE and FMS - her aches have been mainly in the knees, right elbow, and right shoulder which is about baseline for her for many weeks to over a month.  She is trying to get back in with Ortho at [facility].  We will provide new referral if needed.  She also has appt with pain specialty (Dr. Jeneen Rinks at [facility]) at the end of the month.   Patient states "I would like to go to the [employer] Through just to be sure".  She does not have any chest tightness/pain, shortness of breath, or fever.  There has been no known contact with confirmed COVID-19 cases, she has not traveled in the last 2 weeks, and she is not a Public house manager.  Education is provided for social distancing as she is on plaquenil and is immunocompromised.  Patient Active Problem List   Diagnosis Date Noted  . Hyperglycemia 10/23/2017  . Vertigo 09/29/2016  . Bronchiolitis obliterans organizing pneumonia ([place]) 09/29/2016  . Neck pain, musculoskeletal 12/15/2015  . Anxiety about health 09/29/2015  . Abnormal CAT scan 04/11/2015  . Auditory impairment 04/11/2015  . Insomnia, persistent 04/11/2015  . Chronic kidney disease (CKD), stage I 04/11/2015  . Chronic nonmalignant pain 04/11/2015  . Chronic constipation 04/11/2015  . Dyslipidemia 04/11/2015  . Fibromyalgia 04/11/2015  . Gastro-esophageal reflux disease without esophagitis 04/11/2015  . Dysphonia 04/11/2015  . Absence of bladder continence 04/11/2015  . Low back pain  04/11/2015  . Eczema intertrigo 04/11/2015  . Keratosis pilaris 04/11/2015  . Chronic recurrent major depressive disorder ([place]) 04/11/2015  . Dysmetabolic syndrome 51/70/0174  . Neurogenic claudication 04/11/2015  . Perennial allergic rhinitis with seasonal variation 04/11/2015  . Abnormal presence of protein in urine 04/11/2015  . Seborrhea capitis 04/11/2015  . Moderate dysplasia of vulva 04/11/2015  . Vitamin D deficiency 04/11/2015  . Treadmill stress test negative for angina pectoris 03/05/2015  . Shortness of breath 05/20/2014  . Morbid obesity ([place]) 02/03/2014  . Asthma, chronic 12/31/2013  . H/O total knee replacement 12/31/2013  . Episcleritis 09/26/2013  . Vocal cord dysfunction 05/28/2013  . Anxiety, generalized 03/19/2013  . Back pain 12/18/2012  . Pulmonary nodules 11/26/2012  . Lung nodule, multiple 11/26/2012  . Arthritis, degenerative 11/01/2012  . H/O aspiration pneumonitis 10/22/2012  . Postinflammatory pulmonary fibrosis ([place]) 10/22/2012  . Mixed incontinence 05/04/2012  . Benign neoplasm of pituitary gland and craniopharyngeal duct ([place]) 12/29/2010  . Lung involvement in systemic lupus erythematosus ([place]) 11/05/2010  . Chronic nausea 07/01/2008  . Benign hypertension 01/25/2005    Social History   Tobacco Use  . Smoking status: Never Smoker  . Smokeless tobacco: Never Used  Substance Use Topics  . Alcohol use: No    Alcohol/week: 0.0 standard drinks     Current Outpatient Medications:  .  aspirin EC 81 MG tablet, Take 1 tablet (81 mg total) by mouth daily., Disp: 30 tablet,  Rfl: 0 .  atorvastatin (LIPITOR) 40 MG tablet, TAKE 1 TABLET BY MOUTH  DAILY, Disp: 90 tablet, Rfl: 1 .  buPROPion (WELLBUTRIN XL) 300 MG 24 hr tablet, Take 1 tablet by mouth daily., Disp: , Rfl:  .  calcipotriene (DOVONOX) 0.005 % ointment, APP TO RASH AREAS IN FOLDS QOD AS MAINTENANCE., Disp: , Rfl:  .  Cholecalciferol (VITAMIN D) 2000 UNITS CAPS, Take 1 capsule by mouth  daily., Disp: , Rfl:  .  clobetasol ointment (TEMOVATE) 0.05 %, Apply topically., Disp: , Rfl:  .  clonazePAM (KLONOPIN) 0.5 MG tablet, Take 0.5 tablets (0.25 mg total) by mouth daily., Disp: 15 tablet, Rfl: 0 .  cyclobenzaprine (FLEXERIL) 5 MG tablet, TAKE 1 TABLET BY MOUTH  EVERY 8 HOURS AS NEEDED FOR MUSCLE SPASMS. DO NOT DRIVE FOR 8 HOURS, Disp: 90 tablet, Rfl: 1 .  diclofenac sodium (VOLTAREN) 1 % GEL, APP 2 GRAMS EXT AA QID, Disp: , Rfl: 6 .  docusate sodium (COLACE) 100 MG capsule, Take by mouth., Disp: , Rfl:  .  DULoxetine (CYMBALTA) 60 MG capsule, Take 120 mg by mouth at bedtime. , Disp: , Rfl:  .  fluticasone (FLONASE) 50 MCG/ACT nasal spray, USE 2 SPRAYS IN EACH  NOSTRIL DAILY, Disp: 48 g, Rfl: 1 .  gabapentin (NEURONTIN) 300 MG capsule, Take 1 capsule by mouth at bedtime., Disp: , Rfl:  .  hydroxychloroquine (PLAQUENIL) 200 MG tablet, Take 200 mg by mouth 2 (two) times daily., Disp: , Rfl:  .  ketoconazole (NIZORAL) 2 % cream, Apply 1 application topically daily., Disp: 15 g, Rfl: 0 .  lamoTRIgine (LAMICTAL) 200 MG tablet, Take 1 tablet (200 mg total) by mouth 2 (two) times daily., Disp: 60 tablet, Rfl: 0 .  meclizine (ANTIVERT) 25 MG tablet, Take 1 tablet (25 mg total) by mouth 3 (three) times daily as needed., Disp: 30 tablet, Rfl: 0 .  Multiple Vitamin (MULTIVITAMIN) tablet, Take 1 tablet by mouth daily., Disp: , Rfl:  .  mycophenolate (MYFORTIC) 360 MG TBEC EC tablet, Take by mouth., Disp: , Rfl:  .  olopatadine (PATANOL) 0.1 % ophthalmic solution, Place 1 drop into both eyes 2 (two) times daily., Disp: , Rfl:  .  omeprazole (PRILOSEC) 40 MG capsule, TAKE 1 CAPSULE BY MOUTH  DAILY, Disp: 90 capsule, Rfl: 1 .  ondansetron (ZOFRAN) 4 MG tablet, TAKE 1 TABLET BY MOUTH  EVERY 8 HOURS AS NEEDED FOR NAUSEA AND VOMITING, Disp: 60 tablet, Rfl: 0 .  prednisoLONE acetate (PRED FORTE) 1 % ophthalmic suspension, Place 1 drop into both eyes 2 (two) times daily as needed., Disp: , Rfl: 0 .   telmisartan (MICARDIS) 40 MG tablet, TAKE 1 TABLET(40 MG) BY MOUTH DAILY, Disp: 30 tablet, Rfl: 0 .  traZODone (DESYREL) 100 MG tablet, Take 1 tablet by mouth at bedtime., Disp: , Rfl:  .  triamcinolone ointment (KENALOG) 0.1 %, , Disp: , Rfl:  .  YUVAFEM 10 MCG TABS vaginal tablet, Place 1 tablet vaginally once a week., Disp: , Rfl:   Allergies  Allergen Reactions  . Ace Inhibitors     Other reaction(s): OTHER Pt states she can not take ace inhibitors. Pt states she can not remember her reaction.   [place] Penicillins   . Pollen Extract     I personally reviewed active problem list, medication list, allergies, notes from last encounter with the patient/caregiver today.  ROS  Ten systems reviewed and is negative except as mentioned in HPI.  Objective  Vitals:  11/20/18 0723  BP: 128/82  Pulse: 88  Resp: 16  Temp: 98.2 F (36.8 C)  TempSrc: Oral  SpO2: 97%  Weight: 196 lb 14.4 oz (89.3 kg)  Height: 5\' 8"  (1.727 m)   Body mass index is 29.94 kg/m.  Nursing Note and Vital Signs reviewed.  Physical Exam  Constitutional: Patient appears well-developed and well-nourished. No distress.  HENT: Head: Normocephalic and atraumatic. Ears: bilateral TMs with no erythema or effusion; Nose: Nose normal. Mouth/Throat: Oropharynx is clear and moist. No oropharyngeal exudate or tonsillar swelling.  Eyes: Conjunctivae and EOM are normal. No scleral icterus.  Pupils are equal, round, and reactive to light.  Neck: Normal range of motion. Neck supple. No JVD present. No thyromegaly present.  Cardiovascular: Normal rate, regular rhythm and normal heart sounds.  No murmur heard. No BLE edema. Pulmonary/Chest: Effort normal and breath sounds normal. No respiratory distress. Musculoskeletal: Normal range of motion, no joint effusions. No gross deformities Neurological: Pt is alert and oriented to person, place, and time. No cranial nerve deficit. Coordination, balance, strength, speech and gait are  normal.  Skin: Skin is warm and dry. No rash noted. No erythema.  Psychiatric: Patient has a normal mood and affect. behavior is normal. Judgment and thought content normal.  No results found for this or any previous visit (from the past 72 hour(s)).  Assessment & Plan  1. Perennial allergic rhinitis with seasonal variatio - benzonatate (TESSALON PERLES) 100 MG capsule; Take 1 capsule (100 mg total) by mouth 3 (three) times daily as needed.  Dispense: 20 capsule; Refill: 0 - fexofenadine (ALLEGRA ALLERGY) 180 MG tablet; Take 1 tablet (180 mg total) by mouth daily.  Dispense: 90 tablet; Refill: 1 - fluticasone (FLONASE) 50 MCG/ACT nasal spray; Place 2 sprays into both nostrils daily.  Dispense: 48 g; Refill: 3 - olopatadine (PATANOL) 0.1 % ophthalmic solution; Place 1 drop into both eyes 2 (two) times daily.  Dispense: 5 mL; Refill: 3  2. Fibromyalgia - Will follow up with pain management and ortho.  3. Cough - benzonatate (TESSALON PERLES) 100 MG capsule; Take 1 capsule (100 mg total) by mouth 3 (three) times daily as needed.  Dispense: 20 capsule; Refill: 0  4. Chronic nausea - Zofran PRN   -Red flags and when to present for emergency care or RTC including fever >101.46F, chest pain, shortness of breath, new/worsening/un-resolving symptoms, reviewed with patient at time of visit. Follow up and care instructions discussed and provided in AVS.

## 2018-11-20 NOTE — Patient Instructions (Addendum)
Coronavirus (COVID-19) Are you at risk?  Are you at risk for the Coronavirus (COVID-19)?  To be considered HIGH RISK for Coronavirus (COVID-19), you have to meet the following criteria:  . Traveled to [place], [place], [place], [place] or [place]; or in the [place] to [place], [place], [place], or [place]; and have fever, cough, and shortness of breath within the last 2 weeks of travel OR . Been in close contact with a person diagnosed with COVID-19 within the last 2 weeks and have fever, cough, and shortness of breath . IF YOU DO NOT MEET THESE CRITERIA, YOU ARE CONSIDERED LOW RISK FOR COVID-19.  What to do if you are HIGH RISK for COVID-19?  . If you are having a medical emergency, call 911. . Seek medical care right away. Before you go to a doctor's office, urgent care or emergency department, call ahead and tell them about your recent travel, contact with someone diagnosed with COVID-19, and your symptoms. You should receive instructions from your physician's office regarding next steps of care.  . When you arrive at healthcare provider, tell the healthcare staff immediately you have returned from visiting [place], [place], [place], [place] or [place]; or traveled in the [place] to [place], [place], [place], or [place]; in the last two weeks or you have been in close contact with a person diagnosed with COVID-19 in the last 2 weeks.   . Tell the health care staff about your symptoms: fever, cough and shortness of breath. . After you have been seen by a medical provider, you will be either: o Tested for (COVID-19) and discharged home on quarantine except to seek medical care if symptoms worsen, and asked to  - Stay home and avoid contact with others until you get your results (4-5 days)  - Avoid travel on public transportation if possible (such as bus, train, or airplane) or o Sent to the Emergency Department by EMS for evaluation, COVID-19 testing, and possible  admission depending on your condition and test results.  What to do if you are LOW RISK for COVID-19?  Reduce your risk of any infection by using the same precautions used for avoiding the common cold or flu:  . Wash your hands often with soap and warm water for at least 20 seconds.  If soap and water are not readily available, use an alcohol-based hand sanitizer with at least 60% alcohol.  . If coughing or sneezing, cover your mouth and nose by coughing or sneezing into the elbow areas of your shirt or coat, into a tissue or into your sleeve (not your hands). . Avoid shaking hands with others and consider head nods or verbal greetings only. . Avoid touching your eyes, nose, or mouth with unwashed hands.  . Avoid close contact with people who are sick. . Avoid places or events with large numbers of people in one location, like concerts or sporting events. . Carefully consider travel plans you have or are making. . If you are planning any travel outside or inside the [place], visit the CDC's Travelers' Health webpage for the latest health notices. . If you have some symptoms but not all symptoms, continue to monitor at home and seek medical attention if your symptoms worsen. . If you are having a medical emergency, call 911.   ADDITIONAL HEALTHCARE OPTIONS FOR PATIENTS  [place] / e-Visit: [email or web address]         [facility]: [phone]  [place]   [facility]: [phone]                   [facility]: [phone]   

## 2018-11-20 NOTE — Unmapped (Signed)
Mycophenolate refill  Last ov: 09/20/2018  Next ov: 01/22/2019   Labs:   AST   Date Value Ref Range Status   09/20/2018 27 14 - 38 U/L Final   11/05/2014 32 14 - 38 U/L Final     ALT   Date Value Ref Range Status   09/20/2018 15 <35 U/L Final   11/05/2014 21 15 - 48 U/L Final     Creatinine Whole Blood, POC   Date Value Ref Range Status   12/25/2014 1.0 0.7 - 1.1 mg/dL Final   29/56/2130 1.0 0.7 - 1.1 MG/DL Final     Comment:     Performed by:  [facility]  [address] [place], [place] [address]     Creatinine/CP   Date Value Ref Range Status   08/07/2012 0.92 0.60 - 1.00 MG/DL Final     Creatinine   Date Value Ref Range Status   09/20/2018 0.86 0.60 - 1.00 mg/dL Final   46/96/2952 8.41 0.57 - 1.00 mg/dL Final     WBC   Date Value Ref Range Status   09/20/2018 4.4 (L) 4.5 - 11.0 10*9/L Final   11/05/2014 4.5 4.5 - 11.0 10*9/L Final     HGB   Date Value Ref Range Status   09/20/2018 12.9 12.0 - 16.0 g/dL Final   32/44/0102 72.5 12.0 - 16.0 g/dL Final     HCT   Date Value Ref Range Status   09/20/2018 41.2 36.0 - 46.0 % Final   11/05/2014 41.4 36.0 - 46.0 % Final     MCV   Date Value Ref Range Status   09/20/2018 90.5 80.0 - 100.0 fL Final   11/05/2014 85 80 - 100 fL Final     RDW   Date Value Ref Range Status   09/20/2018 13.6 12.0 - 15.0 % Final   11/05/2014 14.2 12.0 - 15.0 % Final     Platelet   Date Value Ref Range Status   09/20/2018 369 150 - 440 10*9/L Final   11/05/2014 304 150 - 440 10*9/L Final     Neutrophils %   Date Value Ref Range Status   09/20/2018 47.0 % Final     Lymphocytes %   Date Value Ref Range Status   09/20/2018 34.4 % Final     Monocytes %   Date Value Ref Range Status   09/20/2018 8.4 % Final     Eosinophils %   Date Value Ref Range Status   09/20/2018 3.3 % Final     Basophils %   Date Value Ref Range Status   09/20/2018 2.7 % Final

## 2018-11-20 NOTE — Unmapped (Signed)
[facility] Refill Coordination Note    Specialty Medication(s) to be Shipped:   Inflammatory Disorders: Mycophenolate 360    Other medication(s) to be shipped: n/a     [name], DOB: June 13, 1956  Phone: [phone] (home)       All above HIPAA information was verified with patient.     Completed refill call assessment today to schedule patient's medication shipment from the [facility] [phone]).       Specialty medication(s) and dose(s) confirmed: Regimen is correct and unchanged.   Changes to medications: Calvin reports no changes reported at this time.  Changes to insurance: No  Questions for the pharmacist: No    Confirmed patient received Welcome Packet with first shipment. The patient will receive a drug information handout for each medication shipped and additional FDA Medication Guides as required.       DISEASE/MEDICATION-SPECIFIC INFORMATION        N/A    SPECIALTY MEDICATION ADHERENCE     Medication Adherence    Patient reported X missed doses in the last month:  0  Specialty Medication:  Mycophenolate 360  Patient is on additional specialty medications:  No  Patient is on more than two specialty medications:  No  Any gaps in refill history greater than 2 weeks in the last 3 months:  no  Demonstrates understanding of importance of adherence:  yes  Informant:  patient                Mycophenolate 360mg  : Patient has a full bottle of medication on hand      SHIPPING     Shipping address confirmed in Epic.     Delivery Scheduled: Yes, Expected medication delivery date: 11/23/18.     Medication will be delivered via Next Day Courier to the home address in Epic WAM.    Olga Millers   [facility] Specialty Technician

## 2018-11-22 MED FILL — MYCOPHENOLATE SODIUM 360 MG TABLET,DELAYED RELEASE: 90 days supply | Qty: 180 | Fill #0 | Status: AC

## 2018-11-27 ENCOUNTER — Encounter
Admit: 2018-11-27 | Discharge: 2018-11-28 | Payer: MEDICARE | Attending: Student in an Organized Health Care Education/Training Program | Primary: Student in an Organized Health Care Education/Training Program

## 2018-11-27 DIAGNOSIS — R413 Other amnesia: Principal | ICD-10-CM

## 2018-11-27 DIAGNOSIS — G4700 Insomnia, unspecified: Principal | ICD-10-CM

## 2018-11-27 DIAGNOSIS — F411 Generalized anxiety disorder: Principal | ICD-10-CM

## 2018-11-27 DIAGNOSIS — F329 Major depressive disorder, single episode, unspecified: Principal | ICD-10-CM

## 2018-11-27 MED ORDER — CLONAZEPAM 0.5 MG TABLET
ORAL_TABLET | Freq: Every evening | ORAL | 2 refills | 0 days | Status: CP
Start: 2018-11-27 — End: 2019-03-20

## 2018-11-27 NOTE — Unmapped (Signed)
[facility]  Psychiatry Telehealth Encounter  Established Patient     Encounter Description/Consent: This encounter was conducted from home via telephone with the patient. Caitlin Smith was located in her apartment. Discussed the choice to participate in care through the use of telepsychiatry by telephone service. Telepsychiatry enables health care providers at different locations to provide safe, effective, and convenient care through the use of technology. As with any health care service, there are risks associated with the use of telepsychiatry, including lack of visualization and that there may be instances were they need to come to clinic to complete the assessment. Patient verbally understands the risks and benefits of telepsychaitry as explained. All questions regarding telepsychiatry answered.    I notified her that because this is a special type of visit, she may get a bill for a copay or coinsurance. She is OK with proceeding.    Time Spent: 50 minutes    Assessment:  Caitlin Smith is a 62 y.o. female with a history of MDD, GAD, and Panic Disorder in the context of many chronic medical conditions including chronic pain/fibromyalgia, osteoarthritis with degenerative disc disease, Lupus, pituitary tumor (benign), s/p right total knee arthroplasty, and VIN II (s/p resection), who presents for follow-up evaluation. She has been maintained on Lamictal for many years as well as cymbalta, trazodone, klonopin, and wellbutrin. Patient has previously tolerated a slight taper in her klonopin from 0.75 mg to 0.25 mg qHS overtime. Her mood is often exacerbated by steroid use for current treatment of pulmonary disease. Patient is engaged with CBT therapy at the pain clinic.    Today, 10/29/18, engaged with patient in 50 minute phone visit.  Given the circumstances (ie COVID19), she demonstrates resiliency and stable mood during our phone discussion. She reports improvement in her sleep and depression but worsening of anxiety, which she attributes to the current situation.  She is eager, though, to utilize methods of combatting boredom and anxiety that we discussed such as using in-home workout apps on her phone, video chatting with her grandkids, and continuing to engage in her biweekly church services through [employer]. At this visit we decided to continue her regimen of medications as-is.  Though there is concern for polypharmacy, the stability of her mood especially in a time of limited access to healthcare is paramount.  Ms. Cothran reports that she will sound the alarm (I.e call children, call clinicians) should she feel overwhelmed by her stress and anxiety or have any thoughts of SI, which she denies at today's visit.  Will check in with brief phone calll in two weeks on April 7.    Risk Assessment:  A suicide and violence risk assessment was performed as part of this evaluation. There patient is deemed to be at chronic elevated risk for self-harm/suicide given the following factors: current diagnosis of depression, chronic severe medical condition and chronic mental illness > 5 years. The patient is deemed to be at chronic elevated risk for violence given the following factors: N/A. These risk factors are mitigated by the following factors:lack of active SI/HI, no know access to weapons or firearms, motivation for treatment, utilization of positive coping skills, supportive family, presence of an available support system, religious or spiritual prohibition to suicide/violence, current treatment compliance and safe housing. There is no acute risk for suicide or violence at this time. The patient was educated about relevant modifiable risk factors including following recommendations for treatment of psychiatric illness and abstaining from substance abuse.  While future psychiatric events cannot be accurately predicted, the patient does not currently require acute inpatient psychiatric care and does not currently meet [facility] involuntary commitment criteria.     Plan:      Plan:  # Depression - Anxiety - Panic Disorder  - Continue Lamictal 400mg  qhs for mood  - Continue Cymbalta 120mg  qAM to target depressive sx and chronic pain  - Continue Wellbutrin XL to 150mg  qAM (dec 12/31 ) given polypharmacy and insomnia. May consider discontinuation at next visit. Continued on this dose per patients wishes at this time and in the context of new resident and building rapport  - Continue Clonazepam 0.25mg  qHS, appropriate filling per [facility]  - Continue CBT at pain clinic    # Insomnia: Last sleep study performed in 2017. Per chart review, showed mild OSA likely related to medication use. Neurology recommends patient avoid sleeping on back as she likely has OSA when sleeping in this position  - Continue Trazodone 200mg  qHS for insomnia. May plan to discontinue in future if insomnia improves after discontinuation of Wellbutrin and initiation of melatonin.   - Taper Wellbutrin per above  - Clonazepam per above  - Patient provided with sleep hygiene worksheet 09/2018; discussed again 10/23/18    # Mild Neurocognitive Impairment: Patient seen by Neurology ([facility]) 05/22/18 who dx pt with mild neurocognitive impairment w/ deficits in inattention and visual-spatial function with suspicion that sleep apnea and polypharmacy may be contributing to her difficulties. Recommended that psychopharm regimen be simplified, including eliminating trazodone and considering changing Wellbutrin to Zoloft or Lexapro for anxiety and eliminating Klonopin when stable.  - Plan for gradual taper of Wellbutrin per above  - Will consider taper/discontinuation of Trazodone vs Klonopin next if sleep improves after above changes  - However, it is very likely that patient's lupus is significantly contributing to cognitive decline, which will limit improvement she will see with reduction in polypharmacy  --Last MOCA 22 on 12/12/2017 # Medical Monitoring - High Risk Medication Use  - The complexity of this patient's care involves drug therapy which requires intensive monitoring for toxicity. This is in part due to a narrow therapeutic window, as well as potential for toxicity. This patient is being treated with Lamotrigine (Lamictal) to target their psychiatric disorder, Major depressive disorder, refractory. In regards to this medication being considered high risk, Lamotrigine (lamictal) has black box warning for serious rash including fatal reaction of Stevens-Johnson syndrome and rare cases of toxic epidermal necrolysis. In addition to prolonged titration to mitigate risk of serious rash, lamotrigine requires monitoring of hepatic and renal function at baseline, and at times will require monitoring of lamotrigine blood levels.    AST   Date Value Ref Range Status   09/20/2018 27 14 - 38 U/L Final   11/05/2014 32 14 - 38 U/L Final     ALT   Date Value Ref Range Status   09/20/2018 15 <35 U/L Final   11/05/2014 21 15 - 48 U/L Final     Platelet   Date Value Ref Range Status   09/20/2018 369 150 - 440 10*9/L Final   11/05/2014 304 150 - 440 10*9/L Final      Lamotrigine Monitoring  - Hepatic function testing, platelets (via CBC) q6 months   - Last obtained 09/2018   - Due 02/2019    # Return to Clinic: most likely 90 days given COVID19 recommendations  [facility] Covid19 helpline # is [phone].     Psychotherapy:  No billable psychotherapy service provided but brief supportive therapy was utilized.    Patient has been given this writer's contact information as well as the [facility] urgent line number. The patient has been instructed to call 911 for emergencies.    Patient and plan of care were discussed with the Attending MD,Dr. Gunnar Bulla, who agrees with the above statement and plan.    Subjective:     Psychiatric Chief Concern:  Follow-up psychiatric evaluation for depression, anxiety and insomnia.     Interval History:   Spoke with patient via phone. She reports increased anxiety given covid. She says she had been depressed but that she feels like it is lifting. She is taking meds and trying to focus.  Discussed coping skills that she uses, including repeating affirmations when she sees anything that is the color red.  We discussed alternative coping measures to help with stress and boredom, namely using [employer] fitness app since she can't work out at [employer], and video-chatting her grandchildren.  She says that, with respect to memory concerns, things have been better especially since she has started parking her car in the handicapped spot where she can find it right away.     Ms. Toppin says her sleep is better under new circumstances.  She finds herself getting plenty of sleep and feels well rested.  She continues to take her medications as listed in the plan.  Has not tired melatonin yet; discussed that if she feels like she is sleeping well we don't need to start it right away.      Spent time discussing the solidarity that can arise from a challenging situation like this and also had a good laugh about Margie Billet being an in-home way to cope with stress. She is calling her mail pharmacy today to make sure she is OK with her meds and will f/u with me with any concerns. Sent 3 month supply of klonopin to [facility]. Discussed plan to check in for brief phone call in 2 weeks.     Social History: reviewed; pertinents have been documented in the interval history section.    ROS:  As per Interval History and:  Constitutional:  no significant appetite change; no fatigue  Neuro:  none / negative    Objective:    Mental Status Exam:  Speech/Language:    Normal rate, volume, tone, fluency and Language intact, well formed   Mood:   Anxious   Thought process and Associations:   Logical, linear, clear, coherent, goal directed   Abnormal/psychotic thought content:     Denies SI, HI, self harm, delusions, obsessions, paranoid ideation, or ideas of reference   Perceptual disturbances:     Does not endorse auditory or visual hallucinations     Orientation:   Oriented to person, place, time, and general circumstances   Insight:     Intact   Judgment:    Intact   Impulse Control:   Intact     Medications: reviewed at today's visit    Psychometrics:  Psych Scale Scores - Adult      Office Visit from 09/04/2018 in [facility] AT [facility]   **PHQ-9: Severity Measure for DEPRESSION Total Score**  20 Collected on 09/04/2018 0000   WHODAS 2.0 (Self-administered) - Total Score  42 Collected on 09/04/2018 0000          Posey Rea, MD  11/27/2018
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
## 2018-12-03 ENCOUNTER — Encounter: Admit: 2018-12-03 | Discharge: 2018-12-04 | Payer: MEDICARE

## 2018-12-03 DIAGNOSIS — M545 Low back pain: Secondary | ICD-10-CM

## 2018-12-03 DIAGNOSIS — M150 Primary generalized (osteo)arthritis: Principal | ICD-10-CM

## 2018-12-03 DIAGNOSIS — G894 Chronic pain syndrome: Principal | ICD-10-CM

## 2018-12-03 DIAGNOSIS — M797 Fibromyalgia: Principal | ICD-10-CM

## 2018-12-03 DIAGNOSIS — M48061 Spinal stenosis, lumbar region without neurogenic claudication: Principal | ICD-10-CM

## 2018-12-03 DIAGNOSIS — G8929 Other chronic pain: Principal | ICD-10-CM

## 2018-12-03 DIAGNOSIS — Z7952 Long term (current) use of systemic steroids: Principal | ICD-10-CM

## 2018-12-03 DIAGNOSIS — M542 Cervicalgia: Principal | ICD-10-CM

## 2018-12-03 MED ORDER — GABAPENTIN 300 MG CAPSULE
ORAL_CAPSULE | Freq: Three times a day (TID) | ORAL | 2 refills | 0.00000 days | Status: CP
Start: 2018-12-03 — End: 2019-02-18

## 2018-12-03 MED ORDER — CYCLOBENZAPRINE 5 MG TABLET
ORAL_TABLET | Freq: Two times a day (BID) | ORAL | 2 refills | 0 days | Status: CP | PRN
Start: 2018-12-03 — End: 2019-02-18

## 2018-12-03 MED ORDER — LIDOCAINE 5 % TOPICAL PATCH
MEDICATED_PATCH | TRANSDERMAL | 5 refills | 0 days | Status: CP
Start: 2018-12-03 — End: 2019-01-02

## 2018-12-03 MED ORDER — DICLOFENAC 1 % TOPICAL GEL
Freq: Four times a day (QID) | TOPICAL | 5 refills | 0 days | Status: CP
Start: 2018-12-03 — End: 2019-02-18

## 2018-12-03 NOTE — Unmapped (Signed)
I was the supervising physician in the delivery of the service.

## 2018-12-03 NOTE — Unmapped (Signed)
It was nice to see you.  Today we did the following:    1. We have started you on a new medication called gabapentin.  You will increase this medication slowly. Start the medication as listed below:     Week 1: take one at bedtime  Week 2: take one in the morning and one at bedtime  Week 3: take one in the morning, one in the afternoon, one at bedtime  *If you have side effects when increasing the dose, go back to the previous dose for an additional few days, then retry increasing the dose. Usually side effects are temporary, if you do experience them.    2. Continue Voltaren Gel and Flexeril.    3. I've ordered lidocaine patches.  Place one patch on for 12 hours, then remove for 12 hours.  You must have a patch-free period of 12 hours.    4. Continue pain psychology.    5. Follow up in 2-3 months.    Thank you for visiting the [facility].      -Remember to bring all your opioid(narcotic) pill bottles/boxes to every clinic visit, in their original containers.      -Because of the high volume of calls we receive and the high demand for our clinical services, we are occupied all day providing care for patients in the clinic. This leaves little time to respond to phone calls, and we are generally unable to discuss patient care advice over the telephone. If you are experiencing a medication side effect or complication, you can call and let us know, but we will typically not make a medication substitution or change over the telephone.      We are generally unable to respond acutely to a flare up of pain, as this is quite common in our patients and needs to be dealt with as part of the long term management plan. Please make an appointment with [place] if you wish to discuss a matter in any detail. Should you still need to call, please do so at [phone].      Please do not call for early medication refills.      For additional information and services provided by our clinic you may visit our Pain Management Clinic website at:   [email or web address]      Thank you for choosing [facility]. It was a pleasure to see you in clinic today. Please contact us with any questions or concerns at [phone].       Thank you,   Al Corpus, PharmD, CPP

## 2018-12-04 NOTE — Unmapped (Signed)
PA submitted to [facility] via Cover My Meds for Lidocaine 5% patches. Determination: Pending

## 2018-12-04 NOTE — Unmapped (Signed)
PA Submitted to [facility] for Lidocaine 5% Patches Via Cover My Meds    Key: Caitlin Smith (Key: [id number]) ??? [id number]  Lidocaine 5% patches  Status: PA Request  Created: March 31st, 2020  Sent: March 31st, 2020    Determination: Pending

## 2018-12-04 NOTE — Unmapped (Signed)
PA response received from [facility] for Lidocaine 5% Patches    Determination: Caitlin Smith (Key: [id number]) ??? [id number]  Lidocaine 5% patches  Status: PA Response - Approved  Created: March 31st, 2020  Sent: March 31st, 2020    Patient notified

## 2018-12-05 NOTE — Unmapped (Signed)
Medication approved

## 2018-12-11 ENCOUNTER — Ambulatory Visit: Payer: Self-pay | Admitting: *Deleted

## 2018-12-11 ENCOUNTER — Other Ambulatory Visit: Payer: Self-pay

## 2018-12-11 ENCOUNTER — Encounter: Payer: Self-pay | Admitting: Family Medicine

## 2018-12-11 ENCOUNTER — Ambulatory Visit (INDEPENDENT_AMBULATORY_CARE_PROVIDER_SITE_OTHER): Payer: Medicare Other | Admitting: Family Medicine

## 2018-12-11 VITALS — Wt 198.0 lb

## 2018-12-11 DIAGNOSIS — J3089 Other allergic rhinitis: Secondary | ICD-10-CM | POA: Diagnosis not present

## 2018-12-11 DIAGNOSIS — R05 Cough: Secondary | ICD-10-CM

## 2018-12-11 DIAGNOSIS — R059 Cough, unspecified: Secondary | ICD-10-CM

## 2018-12-11 DIAGNOSIS — J302 Other seasonal allergic rhinitis: Secondary | ICD-10-CM | POA: Diagnosis not present

## 2018-12-11 MED ORDER — BENZONATATE 100 MG PO CAPS: 100.0000 mg | ORAL_CAPSULE | Freq: Three times a day (TID) | ORAL | 0 refills | Status: DC | PRN

## 2018-12-11 NOTE — Progress Notes (Signed)
Name: Nicole Ballard   MRN: [id number]    DOB: 03-28-1956   Date:12/11/2018       Progress Note  Subjective  Chief Complaint  Chief Complaint  Patient presents with  . Cough    Patient has been taking all the medications Benzonatate, flonase and patanol. Never got any allegra.  . Wheezing    SOB when she moves around the house.     I connected with@ on 12/11/18 at  2:20 PM EDT by telephone and verified that I am speaking with the correct person using two identifiers.   I discussed the limitations, risks, security and privacy concerns of performing an evaluation and management service by telephone and the availability of in person appointments. Staff also discussed with the patient that there may be a patient responsible charge related to this service. Patient Location: home  Provider location: [facility]   HPI  Cough: she was advised by Pulmonologist to stop all inhalers and was started on Myfortic 360 mg twice daily. She was seen by Raelyn Ensign , NP one week ago. She did not get Allegra ( not given to her by pharmacist). She has a long history of cough and SOB and is stable. She is going for walks outside. No fever or chills. Normal appetite. She was concerned about COVID-19 and wants to make sure she is okay. Explained that her symptoms are chronic and stable and likely unrelated.    Patient Active Problem List   Diagnosis Date Noted  . Hyperglycemia 10/23/2017  . Vertigo 09/29/2016  . Bronchiolitis obliterans organizing pneumonia ([place]) 09/29/2016  . Neck pain, musculoskeletal 12/15/2015  . Anxiety about health 09/29/2015  . Abnormal CAT scan 04/11/2015  . Auditory impairment 04/11/2015  . Insomnia, persistent 04/11/2015  . Chronic kidney disease (CKD), stage I 04/11/2015  . Chronic nonmalignant pain 04/11/2015  . Chronic constipation 04/11/2015  . Dyslipidemia 04/11/2015  . Fibromyalgia 04/11/2015  . Gastro-esophageal reflux disease without esophagitis  04/11/2015  . Dysphonia 04/11/2015  . Absence of bladder continence 04/11/2015  . Low back pain 04/11/2015  . Eczema intertrigo 04/11/2015  . Keratosis pilaris 04/11/2015  . Chronic recurrent major depressive disorder ([place]) 04/11/2015  . Dysmetabolic syndrome 07/37/1062  . Neurogenic claudication 04/11/2015  . Perennial allergic rhinitis with seasonal variation 04/11/2015  . Abnormal presence of protein in urine 04/11/2015  . Seborrhea capitis 04/11/2015  . Moderate dysplasia of vulva 04/11/2015  . Vitamin D deficiency 04/11/2015  . Treadmill stress test negative for angina pectoris 03/05/2015  . Shortness of breath 05/20/2014  . Morbid obesity ([place]) 02/03/2014  . Asthma, chronic 12/31/2013  . H/O total knee replacement 12/31/2013  . Episcleritis 09/26/2013  . Vocal cord dysfunction 05/28/2013  . Anxiety, generalized 03/19/2013  . Back pain 12/18/2012  . Pulmonary nodules 11/26/2012  . Lung nodule, multiple 11/26/2012  . Arthritis, degenerative 11/01/2012  . H/O aspiration pneumonitis 10/22/2012  . Postinflammatory pulmonary fibrosis ([place]) 10/22/2012  . Mixed incontinence 05/04/2012  . Benign neoplasm of pituitary gland and craniopharyngeal duct ([place]) 12/29/2010  . Lung involvement in systemic lupus erythematosus ([place]) 11/05/2010  . Chronic nausea 07/01/2008  . Benign hypertension 01/25/2005    Social History   Tobacco Use  . Smoking status: Never Smoker  . Smokeless tobacco: Never Used  Substance Use Topics  . Alcohol use: No    Alcohol/week: 0.0 standard drinks     Current Outpatient Medications:  .  aspirin EC 81 MG tablet, Take 1 tablet (81 mg  total) by mouth daily., Disp: 30 tablet, Rfl: 0 .  atorvastatin (LIPITOR) 40 MG tablet, TAKE 1 TABLET BY MOUTH  DAILY, Disp: 90 tablet, Rfl: 1 .  benzonatate (TESSALON PERLES) 100 MG capsule, Take 1-2 capsules (100-200 mg total) by mouth 3 (three) times daily as needed., Disp: 40 capsule, Rfl: 0 .  buPROPion (WELLBUTRIN XL)  300 MG 24 hr tablet, Take 1 tablet by mouth daily., Disp: , Rfl:  .  calcipotriene (DOVONOX) 0.005 % ointment, APP TO RASH AREAS IN FOLDS QOD AS MAINTENANCE., Disp: , Rfl:  .  Cholecalciferol (VITAMIN D) 2000 UNITS CAPS, Take 1 capsule by mouth daily., Disp: , Rfl:  .  clobetasol ointment (TEMOVATE) 0.05 %, Apply topically., Disp: , Rfl:  .  clonazePAM (KLONOPIN) 0.5 MG tablet, Take 0.5 tablets (0.25 mg total) by mouth daily., Disp: 15 tablet, Rfl: 0 .  cyclobenzaprine (FLEXERIL) 5 MG tablet, TAKE 1 TABLET BY MOUTH  EVERY 8 HOURS AS NEEDED FOR MUSCLE SPASMS. DO NOT DRIVE FOR 8 HOURS, Disp: 90 tablet, Rfl: 1 .  diclofenac sodium (VOLTAREN) 1 % GEL, APP 2 GRAMS EXT AA QID, Disp: , Rfl: 6 .  docusate sodium (COLACE) 100 MG capsule, Take by mouth., Disp: , Rfl:  .  DULoxetine (CYMBALTA) 60 MG capsule, Take 120 mg by mouth at bedtime. , Disp: , Rfl:  .  fluticasone (FLONASE) 50 MCG/ACT nasal spray, Place 2 sprays into both nostrils daily., Disp: 48 g, Rfl: 3 .  gabapentin (NEURONTIN) 300 MG capsule, Take 1 capsule by mouth at bedtime., Disp: , Rfl:  .  hydroxychloroquine (PLAQUENIL) 200 MG tablet, Take 200 mg by mouth 2 (two) times daily., Disp: , Rfl:  .  ketoconazole (NIZORAL) 2 % cream, Apply 1 application topically daily., Disp: 15 g, Rfl: 0 .  lamoTRIgine (LAMICTAL) 200 MG tablet, Take 1 tablet (200 mg total) by mouth 2 (two) times daily., Disp: 60 tablet, Rfl: 0 .  Multiple Vitamin (MULTIVITAMIN) tablet, Take 1 tablet by mouth daily., Disp: , Rfl:  .  mycophenolate (MYFORTIC) 360 MG TBEC EC tablet, Take 1 tablet by mouth 2 (two) times daily., Disp: , Rfl:  .  olopatadine (PATANOL) 0.1 % ophthalmic solution, Place 1 drop into both eyes 2 (two) times daily., Disp: 5 mL, Rfl: 3 .  omeprazole (PRILOSEC) 40 MG capsule, TAKE 1 CAPSULE BY MOUTH  DAILY, Disp: 90 capsule, Rfl: 1 .  ondansetron (ZOFRAN) 4 MG tablet, TAKE 1 TABLET BY MOUTH  EVERY 8 HOURS AS NEEDED FOR NAUSEA AND VOMITING, Disp: 60 tablet,  Rfl: 0 .  telmisartan (MICARDIS) 40 MG tablet, TAKE 1 TABLET(40 MG) BY MOUTH DAILY, Disp: 30 tablet, Rfl: 0 .  traZODone (DESYREL) 100 MG tablet, Take 1 tablet by mouth at bedtime., Disp: , Rfl:  .  triamcinolone ointment (KENALOG) 0.1 %, , Disp: , Rfl:  .  fexofenadine (ALLEGRA ALLERGY) 180 MG tablet, Take 1 tablet (180 mg total) by mouth daily. (Patient not taking: Reported on 12/11/2018), Disp: 90 tablet, Rfl: 1 .  meclizine (ANTIVERT) 25 MG tablet, Take 1 tablet (25 mg total) by mouth 3 (three) times daily as needed., Disp: 30 tablet, Rfl: 0 .  prednisoLONE acetate (PRED FORTE) 1 % ophthalmic suspension, Place 1 drop into both eyes 2 (two) times daily as needed., Disp: , Rfl: 0 .  YUVAFEM 10 MCG TABS vaginal tablet, Place 1 tablet vaginally once a week., Disp: , Rfl:   Allergies  Allergen Reactions  . Ace Inhibitors  Other reaction(s): OTHER Pt states she can not take ace inhibitors. Pt states she can not remember her reaction.   . Bee Pollen   . Penicillins   . Pollen Extract     I personally reviewed active problem list, medication list, allergies, family history, social history with the patient/caregiver today.  ROS  Ten systems reviewed and is negative except as mentioned in HPI   Objective  Virtual encounter, vitals not obtained.  Body mass index is 30.11 kg/m.  Nursing Note and Vital Signs reviewed.  Physical Exam  Speaking if full sentences, in no distress    Assessment & Plan  1. Perennial allergic rhinitis with seasonal variation  - benzonatate (TESSALON PERLES) 100 MG capsule; Take 1-2 capsules (100-200 mg total) by mouth 3 (three) times daily as needed.  Dispense: 40 capsule; Refill: 0  2. Cough  - benzonatate (TESSALON PERLES) 100 MG capsule; Take 1-2 capsules (100-200 mg total) by mouth 3 (three) times daily as needed.  Dispense: 40 capsule; Refill: 0   -Red flags and when to present for emergency care or RTC including fever >101.57F, chest pain,  shortness of breath, new/worsening/un-resolving symptoms - I discussed the assessment and treatment plan with the patient. The patient was provided an opportunity to ask questions and all were answered. The patient agreed with the plan and demonstrated an understanding of the instructions.  - The patient was advised to call back or seek an in-person evaluation if the symptoms worsen or if the condition fails to improve as anticipated.  I provided 15  minutes of non-face-to-face time during this encounter.  Loistine Chance, MD

## 2018-12-11 NOTE — Telephone Encounter (Signed)
Pt reports dry cough x 2 weeks. States now with SOB, onset one week ago. States SOB at rest, "Unable to describe." Speech non-halting during call. States Cough is moderate, wakes her at night. Saw E. Uvaldo Rising 11/20/2018:    'Perennial allergic rhinitis with seasonal variation' States she has taken the tessalon pearls but was not aware and did not receive the Allegra.  H/O asthma, Lupus with lung involvement.  Pt does not have smart phone. Has computer and email. Email verified. [place] called practice and spoke with Melissa. Call transferred.   Reason for Disposition . HIGH RISK patient (e.g., age > 41 years, diabetes, heart or lung disease, weak immune system)  Answer Assessment - Initial Assessment Questions 1. COVID-19 DIAGNOSIS: "Who made your Coronavirus (COVID-19) diagnosis?" "Was it confirmed by a positive lab test?" If not diagnosed by a HCP, ask "Are there lots of cases (community spread) where you live?" (See public health department website, if unsure)   * MAJOR community spread: high number of cases; numbers of cases are increasing; many people hospitalized.   * MINOR community spread: low number of cases; not increasing; few or no people hospitalized     N/A 2. ONSET: "When did the COVID-19 symptoms start?"      Cough 2 weeks ago, SOB 1 week ago 3. WORST SYMPTOM: "What is your worst symptom?" (e.g., cough, fever, shortness of breath, muscle aches)     SOB at rest and with exertion 4. COUGH: "How bad is the cough?"       Moderate, intermittent 5. FEVER: "Do you have a fever?" If so, ask: "What is your temperature, how was it measured, and when did it start?"     unsure 6. RESPIRATORY STATUS: "Describe your breathing?" (e.g., shortness of breath, wheezing, unable to speak)     Can't describe 7. BETTER-SAME-WORSE: "Are you getting better, staying the same or getting worse compared to yesterday?"  If getting worse, ask, "In what way?"    Same 8. HIGH RISK DISEASE: "Do you have any chronic  medical problems?" (e.g., asthma, heart or lung disease, weak immune system, etc.)    Lupus, asthma, pulmonary fibrosis  10. OTHER SYMPTOMS: "Do you have any other symptoms?"  (e.g., runny nose, headache, sore throat, loss of smell)       Headache, Always have  Protocols used: CORONAVIRUS (COVID-19) DIAGNOSED OR SUSPECTED-A-AH

## 2018-12-13 NOTE — Unmapped (Signed)
Patient has enough medication, rescheduling refill call for 6/15

## 2019-01-01 ENCOUNTER — Other Ambulatory Visit: Payer: Self-pay | Admitting: Family Medicine

## 2019-01-01 DIAGNOSIS — I10 Essential (primary) hypertension: Secondary | ICD-10-CM

## 2019-01-14 NOTE — Unmapped (Signed)
[facility] Refill Coordination Note    Specialty Medication(s) to be Shipped:   Transplant:  mycophenolic acid 360mg     Other medication(s) to be shipped: n/a     [name], DOB: 1956-05-14  Phone: [phone] (home)       All above HIPAA information was verified with patient.     Completed refill call assessment today to schedule patient's medication shipment from the [facility] [phone]).       Specialty medication(s) and dose(s) confirmed: Regimen is correct and unchanged.   Changes to medications: Garnette reports no changes at this time.  Changes to insurance: No  Questions for the pharmacist: No    Confirmed patient received Welcome Packet with first shipment. The patient will receive a drug information handout for each medication shipped and additional FDA Medication Guides as required.       DISEASE/MEDICATION-SPECIFIC INFORMATION        N/A    SPECIALTY MEDICATION ADHERENCE     Medication Adherence    Patient reported X missed doses in the last month:  0  Specialty Medication:  Mycophenolate 360mg   Patient is on additional specialty medications:  No                Mycophenolate 360 mg: 10 days of medicine on hand          SHIPPING     Shipping address confirmed in Epic.     Delivery Scheduled: Yes, Expected medication delivery date: 01/17/19.     Medication will be delivered via Next Day Courier to the prescription address in Epic WAM.    Jasper Loser   [facility] Specialty Technician

## 2019-01-16 MED FILL — MYCOPHENOLATE SODIUM 360 MG TABLET,DELAYED RELEASE: ORAL | 90 days supply | Qty: 180 | Fill #1

## 2019-01-16 MED FILL — MYCOPHENOLATE SODIUM 360 MG TABLET,DELAYED RELEASE: 90 days supply | Qty: 180 | Fill #1 | Status: AC

## 2019-01-21 ENCOUNTER — Other Ambulatory Visit
Admission: RE | Admit: 2019-01-21 | Discharge: 2019-01-21 | Disposition: A | Discharge status: Home / Self Care | Payer: Medicare Other | Source: Ambulatory Visit | Attending: Pediatrics | Admitting: Pediatrics

## 2019-01-21 ENCOUNTER — Emergency Department
Admission: EM | Admit: 2019-01-21 | Discharge: 2019-01-21 | Disposition: A | Discharge status: Home / Self Care | Payer: Medicare Other | Attending: Emergency Medicine | Admitting: Emergency Medicine

## 2019-01-21 ENCOUNTER — Encounter: Payer: Self-pay | Admitting: Emergency Medicine

## 2019-01-21 ENCOUNTER — Other Ambulatory Visit: Payer: Self-pay

## 2019-01-21 ENCOUNTER — Ambulatory Visit: Payer: Self-pay | Admitting: *Deleted

## 2019-01-21 ENCOUNTER — Emergency Department: Payer: Medicare Other

## 2019-01-21 DIAGNOSIS — N181 Chronic kidney disease, stage 1: Secondary | ICD-10-CM | POA: Diagnosis not present

## 2019-01-21 DIAGNOSIS — R05 Cough: Secondary | ICD-10-CM | POA: Insufficient documentation

## 2019-01-21 DIAGNOSIS — I129 Hypertensive chronic kidney disease with stage 1 through stage 4 chronic kidney disease, or unspecified chronic kidney disease: Secondary | ICD-10-CM | POA: Insufficient documentation

## 2019-01-21 DIAGNOSIS — E1122 Type 2 diabetes mellitus with diabetic chronic kidney disease: Secondary | ICD-10-CM | POA: Diagnosis not present

## 2019-01-21 DIAGNOSIS — Z79899 Other long term (current) drug therapy: Secondary | ICD-10-CM | POA: Diagnosis not present

## 2019-01-21 DIAGNOSIS — R079 Chest pain, unspecified: Secondary | ICD-10-CM | POA: Insufficient documentation

## 2019-01-21 DIAGNOSIS — Z7982 Long term (current) use of aspirin: Secondary | ICD-10-CM | POA: Diagnosis not present

## 2019-01-21 DIAGNOSIS — J45909 Unspecified asthma, uncomplicated: Secondary | ICD-10-CM | POA: Diagnosis not present

## 2019-01-21 DIAGNOSIS — J0100 Acute maxillary sinusitis, unspecified: Secondary | ICD-10-CM | POA: Diagnosis not present

## 2019-01-21 DIAGNOSIS — R1031 Right lower quadrant pain: Secondary | ICD-10-CM | POA: Diagnosis not present

## 2019-01-21 DIAGNOSIS — R7989 Other specified abnormal findings of blood chemistry: Secondary | ICD-10-CM | POA: Diagnosis not present

## 2019-01-21 LAB — BASIC METABOLIC PANEL
Anion gap: 6 (ref 5–15)
BUN: 14 mg/dL (ref 8–23)
CO2: 30 mmol/L (ref 22–32)
Calcium: 9 mg/dL (ref 8.9–10.3)
Chloride: 102 mmol/L (ref 98–111)
Creatinine, Ser: 0.72 mg/dL (ref 0.44–1.00)
GFR calc Af Amer: >60 mL/min (ref >60)
GFR calc non Af Amer: >60 mL/min (ref >60)
Glucose, Bld: 79 mg/dL (ref 70–99)
Potassium: 4.6 mmol/L (ref 3.5–5.1)
Sodium: 138 mmol/L (ref 135–145)

## 2019-01-21 LAB — CBC
HCT: 42.7 % (ref 36.0–46.0)
Hemoglobin: 13.5 g/dL (ref 12.0–15.0)
MCH: 27.1 pg (ref 26.0–34.0)
MCHC: 31.6 g/dL (ref 30.0–36.0)
MCV: 85.7 fL (ref 80.0–100.0)
Platelets: 339 10*3/uL (ref 150–400)
RBC: 4.98 MIL/uL (ref 3.87–5.11)
RDW: 13.3 % (ref 11.5–15.5)
WBC: 4.9 10*3/uL (ref 4.0–10.5)
nRBC: 0 % (ref 0.0–0.2)

## 2019-01-21 LAB — FIBRIN DERIVATIVES D-DIMER (ARMC ONLY): Fibrin derivatives D-dimer (ARMC): 649.14 ng/mL (FEU) — ABNORMAL HIGH (ref 0.00–499.00)

## 2019-01-21 LAB — BRAIN NATRIURETIC PEPTIDE: B Natriuretic Peptide: 7 pg/mL (ref 0.0–100.0)

## 2019-01-21 LAB — TROPONIN I
Troponin I: <0.03 ng/mL (ref <0.03)
Troponin I: <0.03 ng/mL (ref <0.03)

## 2019-01-21 LAB — LIPASE, BLOOD: Lipase: 26 U/L (ref 11–51)

## 2019-01-21 MED ORDER — SODIUM CHLORIDE 0.9% FLUSH: 3.0000 mL | Freq: Once | INTRAVENOUS | Status: DC

## 2019-01-21 MED ORDER — IOHEXOL 350 MG/ML SOLN
75.0000 mL | Freq: Once | INTRAVENOUS | Status: AC | PRN
  Administered 2019-01-21: 75 mL via INTRAVENOUS

## 2019-01-21 NOTE — ED Notes (Addendum)
Pt back from CT now. Comfortable in bed, NAD. Pt given warm blanket and attached back to monitor.

## 2019-01-21 NOTE — Telephone Encounter (Signed)
Pt reports chest pain, onset Thursday. States intermittent, 3-4/10 when occurs. Pt can not verbalize duration or frequency of episodes, "Never paid attention." Evasive historian. Denies SOB, dizziness. States "Some nausea but I'm always nauseated, maybe a little worse." No vomiting. States pain radiates to back at times. Also reports dry cough "For a long time."  [place] called practice and spoke with Suanne Marker. Instructed to route to practice high priority. Pt's email and phone number verified. Care advise given. Pt  states "I will not go to UC or ED.  CB# [phone]  Reason for Disposition . [1] Chest pain lasts > 5 minutes AND [2] occurred > 3 days ago (72 hours) AND [3] NO chest pain or cardiac symptoms now  Answer Assessment - Initial Assessment Questions 1. LOCATION: "Where does it hurt?"       [place] 2. RADIATION: "Does the pain go anywhere else?" (e.g., into neck, jaw, arms, back)     BAck 3. ONSET: "When did the chest pain begin?" (Minutes, hours or days)      Thursday 4. PATTERN "Does the pain come and go, or has it been constant since it started?"  "Does it get worse with exertion?"      Intermittent 5. DURATION: "How long does it last" (e.g., seconds, minutes, hours)     Unsure 6. SEVERITY: "How bad is the pain?"  (e.g., Scale 1-10; mild, moderate, or severe)    - MILD (1-3): doesn't interfere with normal activities     - MODERATE (4-7): interferes with normal activities or awakens from sleep    - SEVERE (8-10): excruciating pain, unable to do any normal activities       3-4/10 7. CARDIAC RISK FACTORS: "Do you have any history of heart problems or risk factors for heart disease?" (e.g., prior heart attack, angina; high blood pressure, diabetes, being overweight, high cholesterol, smoking, or strong family history of heart disease)     HTN 8. PULMONARY RISK FACTORS: "Do you have any history of lung disease?"  (e.g., blood clots in lung, asthma, emphysema, birth control pills)  Asthma, pulm. fibrosis 9. CAUSE: "What do you think is causing the chest pain?"     UNsure 10. OTHER SYMPTOMS: "Do you have any other symptoms?" (e.g., dizziness, nausea, vomiting, sweating, fever, difficulty breathing, cough)    Nausea but "Always nauseated, maybe worse." Dry cough for a long time  Protocols used: CHEST PAIN-A-AH

## 2019-01-21 NOTE — Discharge Instructions (Addendum)
Your lab work was all reassuring.  Your CT was negative for a blood clot in your lungs.  Please call cardiology for a follow-up appointment.  Also call your pulmonologist and your primary care for appointment for reevaluation.

## 2019-01-21 NOTE — ED Provider Notes (Signed)
[facility] Emergency Department Provider Note  ____________________________________________  Time seen: Approximately 5:56 PM  I have reviewed the triage vital signs and the nursing notes.   HISTORY  Chief Complaint Chest Pain    HPI Nicole Ballard is a 63 y.o. female with past medical history anxiety, CKD, diabetes, fibromyalgia, GERD, hypertension, hyperlipidemia, lupus that presents emergency department for evaluation of intermittent central chest discomfort for 1 week.  Patient has had some nasal congestion, sore throat, cough that she has attributed to allergies.  Nothing seems to bring on the chest discomfort.  It can happen when she is sitting or when she is active.  Episodes last several minutes.  She occasionally also has some upper abdominal discomfort that seem to correlate.  She has a history of GERD.  Patient went to [facility] clinic today and was told to come to the emergency department for an elevated d-dimer to rule out PE.  No shortness of breath, palpitations, back pain, vomiting.   Past Medical History:  Diagnosis Date  . Anxiety   . Bipolar 1 disorder ([place])   . Chronic kidney disease   . Chronic pain   . Diabetes mellitus without complication ([place])   . Fibromyalgia   . GERD (gastroesophageal reflux disease)   . Hearing loss of right ear   . Hemifacial spasm   . Hyperlipidemia   . Hypertension   . Insomnia   . Lumbago   . Osteoarthritis   . Paresthesia   . Rhinitis   . Systemic lupus erythematosus ([place])   . Vitamin D deficiency     Patient Active Problem List   Diagnosis Date Noted  . Hyperglycemia 10/23/2017  . Vertigo 09/29/2016  . Bronchiolitis obliterans organizing pneumonia ([place]) 09/29/2016  . Neck pain, musculoskeletal 12/15/2015  . Anxiety about health 09/29/2015  . Abnormal CAT scan 04/11/2015  . Auditory impairment 04/11/2015  . Insomnia, persistent 04/11/2015  . Chronic kidney disease (CKD), stage I 04/11/2015   . Chronic nonmalignant pain 04/11/2015  . Chronic constipation 04/11/2015  . Dyslipidemia 04/11/2015  . Fibromyalgia 04/11/2015  . Gastro-esophageal reflux disease without esophagitis 04/11/2015  . Dysphonia 04/11/2015  . Absence of bladder continence 04/11/2015  . Low back pain 04/11/2015  . Eczema intertrigo 04/11/2015  . Keratosis pilaris 04/11/2015  . Chronic recurrent major depressive disorder ([place]) 04/11/2015  . Dysmetabolic syndrome 50/27/7412  . Neurogenic claudication 04/11/2015  . Perennial allergic rhinitis with seasonal variation 04/11/2015  . Abnormal presence of protein in urine 04/11/2015  . Seborrhea capitis 04/11/2015  . Moderate dysplasia of vulva 04/11/2015  . Vitamin D deficiency 04/11/2015  . Treadmill stress test negative for angina pectoris 03/05/2015  . Shortness of breath 05/20/2014  . Morbid obesity ([place]) 02/03/2014  . Asthma, chronic 12/31/2013  . H/O total knee replacement 12/31/2013  . Episcleritis 09/26/2013  . Vocal cord dysfunction 05/28/2013  . Anxiety, generalized 03/19/2013  . Back pain 12/18/2012  . Pulmonary nodules 11/26/2012  . Lung nodule, multiple 11/26/2012  . Arthritis, degenerative 11/01/2012  . H/O aspiration pneumonitis 10/22/2012  . Postinflammatory pulmonary fibrosis ([place]) 10/22/2012  . Mixed incontinence 05/04/2012  . Benign neoplasm of pituitary gland and craniopharyngeal duct ([place]) 12/29/2010  . Lung involvement in systemic lupus erythematosus ([place]) 11/05/2010  . Chronic nausea 07/01/2008  . Benign hypertension 01/25/2005    Past Surgical History:  Procedure Laterality Date  . BRAIN SURGERY    . CHOLECYSTECTOMY    . VAGINAL HYSTERECTOMY      Prior to  Admission medications   Medication Sig Start Date End Date Taking? Authorizing Provider  aspirin EC 81 MG tablet Take 1 tablet (81 mg total) by mouth daily. 10/31/17   Steele Sizer, MD  atorvastatin (LIPITOR) 40 MG tablet TAKE 1 TABLET BY MOUTH  DAILY 09/10/18   Ancil Boozer,  Drue Stager, MD  benzonatate (TESSALON PERLES) 100 MG capsule Take 1-2 capsules (100-200 mg total) by mouth 3 (three) times daily as needed. 12/11/18   Steele Sizer, MD  buPROPion (WELLBUTRIN XL) 300 MG 24 hr tablet Take 1 tablet by mouth daily. 09/20/18   [provider]  calcipotriene (DOVONOX) 0.005 % ointment APP TO RASH AREAS IN FOLDS QOD AS MAINTENANCE. 07/30/18   [provider]  Cholecalciferol (VITAMIN D) 2000 UNITS CAPS Take 1 capsule by mouth daily.    [provider]  clobetasol ointment (TEMOVATE) 0.05 % Apply topically. 04/17/18 04/17/19  [provider]  clonazePAM (KLONOPIN) 0.5 MG tablet Take 0.5 tablets (0.25 mg total) by mouth daily. 03/16/16   Steele Sizer, MD  cyclobenzaprine (FLEXERIL) 5 MG tablet TAKE 1 TABLET BY MOUTH  EVERY 8 HOURS AS NEEDED FOR MUSCLE SPASMS. DO NOT DRIVE FOR 8 HOURS 23/3/00   Steele Sizer, MD  diclofenac sodium (VOLTAREN) 1 % GEL APP 2 GRAMS EXT AA QID 04/17/15   [provider]  docusate sodium (COLACE) 100 MG capsule Take by mouth.    [provider]  DULoxetine (CYMBALTA) 60 MG capsule Take 120 mg by mouth at bedtime.     [provider]  fexofenadine (ALLEGRA ALLERGY) 180 MG tablet Take 1 tablet (180 mg total) by mouth daily. Patient not taking: Reported on 12/11/2018 11/20/18   Hubbard Hartshorn, FNP  fluticasone [facility]) 50 MCG/ACT nasal spray Place 2 sprays into both nostrils daily. 11/20/18   Hubbard Hartshorn, FNP  gabapentin (NEURONTIN) 300 MG capsule Take 1 capsule by mouth at bedtime. 10/30/17   [provider]  hydroxychloroquine (PLAQUENIL) 200 MG tablet Take 200 mg by mouth 2 (two) times daily.    [provider]  ketoconazole (NIZORAL) 2 % cream Apply 1 application topically daily. 06/06/18   Steele Sizer, MD  lamoTRIgine (LAMICTAL) 200 MG tablet Take 1 tablet (200 mg total) by mouth 2 (two) times daily. 07/31/17   Steele Sizer, MD  meclizine (ANTIVERT) 25 MG tablet  Take 1 tablet (25 mg total) by mouth 3 (three) times daily as needed. 09/06/18   Poulose, Bethel Born, NP  Multiple Vitamin (MULTIVITAMIN) tablet Take 1 tablet by mouth daily.    [provider]  mycophenolate (MYFORTIC) 360 MG TBEC EC tablet Take 1 tablet by mouth 2 (two) times daily. 01/31/18 01/31/19    olopatadine (PATANOL) 0.1 % ophthalmic solution Place 1 drop into both eyes 2 (two) times daily. 11/20/18   Hubbard Hartshorn, FNP  omeprazole (PRILOSEC) 40 MG capsule TAKE 1 CAPSULE BY MOUTH  DAILY 09/10/18   Ancil Boozer, Drue Stager, MD  ondansetron (ZOFRAN) 4 MG tablet TAKE 1 TABLET BY MOUTH  EVERY 8 HOURS AS NEEDED FOR NAUSEA AND VOMITING 07/10/18   Steele Sizer, MD  prednisoLONE acetate (PRED FORTE) 1 % ophthalmic suspension Place 1 drop into both eyes 2 (two) times daily as needed. 08/04/17   [provider]  telmisartan (MICARDIS) 40 MG tablet TAKE 1 TABLET(40 MG) BY MOUTH DAILY 10/28/18   Steele Sizer, MD  traZODone (DESYREL) 100 MG tablet Take 1 tablet by mouth at bedtime. 12/27/16   [provider]  triamcinolone ointment (KENALOG)  0.1 %  10/26/16   [provider]  YUVAFEM 10 MCG TABS vaginal tablet Place 1 tablet vaginally once a week. 07/04/17   [provider]    Allergies Ace inhibitors; Bee pollen; Penicillins; and Pollen extract  Family History  Problem Relation Age of Onset  . Breast cancer Mother   . Cancer Mother 46       Breast  . Cancer Father 36       Stomach  . Breast cancer Maternal Aunt     Social History Social History   Tobacco Use  . Smoking status: Never Smoker  . Smokeless tobacco: Never Used  Substance Use Topics  . Alcohol use: No    Alcohol/week: 0.0 standard drinks  . Drug use: No     Review of Systems  Constitutional: No fever/chills ENT: Positive for nasal congestion. Cardiovascular: Positive for chest discomfort. Respiratory: Positive cough. No SOB. Gastrointestinal: No abdominal pain.  No nausea, no  vomiting.  Musculoskeletal: Negative for musculoskeletal pain. Skin: Negative for rash, abrasions, lacerations, ecchymosis. Neurological: Negative for headaches, numbness or tingling   ____________________________________________   PHYSICAL EXAM:  VITAL SIGNS: ED Triage Vitals  Enc Vitals Group     BP 01/21/19 1630 131/76     Pulse Rate 01/21/19 1630 71     Resp --      Temp 01/21/19 1630 98.3 F (36.8 C)     Temp Source 01/21/19 1630 Oral     SpO2 01/21/19 1630 97 %     Weight 01/21/19 1629 200 lb (90.7 kg)     Height 01/21/19 1629 5\' 8"  (1.727 m)     Head Circumference --      Peak Flow --      Pain Score 01/21/19 1640 0     Pain Loc --      Pain Edu? --      Excl. in [place]? --      Constitutional: Alert and oriented. Well appearing and in no acute distress. Eyes: Conjunctivae are normal. PERRL. EOMI. Head: Atraumatic. ENT:      Ears:      Nose: No congestion/rhinnorhea.      Mouth/Throat: Mucous membranes are moist.  Neck: No stridor.  Cardiovascular: Normal rate, regular rhythm.  Good peripheral circulation. Respiratory: Normal respiratory effort without tachypnea or retractions. Lungs CTAB. Good air entry to the bases with no decreased or absent breath sounds. Gastrointestinal: Bowel sounds 4 quadrants.  Minimal epigastric tenderness to palpation. No guarding or rigidity. No palpable masses. No distention.  Musculoskeletal: Full range of motion to all extremities. No gross deformities appreciated. Neurologic:  Normal speech and language. No gross focal neurologic deficits are appreciated.  Skin:  Skin is warm, dry and intact. No rash noted. Psychiatric: Mood and affect are normal. Speech and behavior are normal. Patient exhibits appropriate insight and judgement.   ____________________________________________   LABS (all labs ordered are listed, but only abnormal results are displayed)  Labs Reviewed  CBC  BASIC METABOLIC PANEL  TROPONIN I  LIPASE, BLOOD    ____________________________________________  EKG  NSR ____________________________________________  RADIOLOGY   Ct Angio Chest Pe W And/or Wo Contrast  Result Date: 01/21/2019 CLINICAL DATA:  63 year old female with history of chest discomfort intermittently over the past 5 days. Elevated D-dimer. EXAM: CT ANGIOGRAPHY CHEST WITH CONTRAST TECHNIQUE: Multidetector CT imaging of the chest was performed using the standard protocol during bolus administration of intravenous contrast. Multiplanar CT image reconstructions and MIPs were obtained to evaluate the  vascular anatomy. CONTRAST:  17mL OMNIPAQUE IOHEXOL 350 MG/ML SOLN COMPARISON:  Chest CT 08/27/2014. FINDINGS: Cardiovascular: No filling defects within the pulmonary arterial tree to suggest underlying pulmonary embolism. Heart size is normal. There is no significant pericardial fluid, thickening or pericardial calcification. No atherosclerotic calcifications in the thoracic aorta or the coronary arteries. Mediastinum/Nodes: No pathologically enlarged mediastinal or hilar lymph nodes. Esophagus is unremarkable in appearance. No axillary lymphadenopathy. Lungs/Pleura: Patchy areas of septal prominence architectural distortion throughout the lung bases, slightly increased compared to remote prior study 08/27/2014, concerning for developing interstitial lung disease. No acute consolidative airspace disease. No pleural effusions. No suspicious appearing pulmonary nodules or masses are noted. Upper Abdomen: Unremarkable. Musculoskeletal: There are no aggressive appearing lytic or blastic lesions noted in the visualized portions of the skeleton. Review of the MIP images confirms the above findings. IMPRESSION: 1. No pulmonary embolism. 2. No acute findings are noted in the thorax to account for the patient's symptoms. 3. Findings are again compatible with interstitial lung disease in the lung bases, favored to reflect mild progression of chronic fibrotic  nonspecific interstitial pneumonia (NSIP). Electronically Signed   By: Vinnie Langton M.D.   On: 01/21/2019 20:06    ____________________________________________    PROCEDURES  Procedure(s) performed:    Procedures    Medications  sodium chloride flush (NS) 0.9 % injection 3 mL (has no administration in time range)  iohexol (OMNIPAQUE) 350 MG/ML injection 75 mL (75 mLs Intravenous Contrast Given 01/21/19 1912)     ____________________________________________   INITIAL IMPRESSION / ASSESSMENT AND PLAN / ED COURSE  Pertinent labs & imaging results that were available during my care of the patient were reviewed by me and considered in my medical decision making (see chart for details).  Review of the [place] CSRS was performed in accordance of the [place] prior to dispensing any controlled drugs.   Patient presented to the emergency department for evaluation of intermittent chest discomfort for 1 week.  Vital signs and exam are reassuring.  Lab work is unremarkable.  Troponin is negative.  BNP earlier today is within normal limits.  Patient was referred to the emergency department for an elevated d-dimer.  CT angio for PE is negative.  Patient has some chronic nonspecific interstitial pneumonia, which she is aware of and will follow-up with pulmonology for.  Patient's intermittent chest discomfort also seems to correlate to some upper abdominal discomfort and may be related to GERD.  She has not had any pain in the emergency department and has been laughing and watching TV.  She is ready and wanting to go home.  She will also be given a referral to cardiology.  Patient is to follow up with cardiology, pulmonology, primary care as directed. Patient is given ED precautions to return to the ED for any worsening or new symptoms.     ____________________________________________  FINAL CLINICAL IMPRESSION(S) / ED DIAGNOSES  Final diagnoses:  Chest pain, unspecified type      NEW  MEDICATIONS STARTED DURING THIS VISIT:  ED Discharge Orders    None          This chart was dictated using voice recognition software/Dragon. Despite best efforts to proofread, errors can occur which can change the meaning. Any change was purely unintentional.    Laban Emperor, PA-C 01/21/19 2136    Nance Pear, MD 01/21/19 2226

## 2019-01-21 NOTE — ED Notes (Signed)
Patient transported to CT 

## 2019-01-21 NOTE — ED Triage Notes (Signed)
C/O chest 'discomfort' intermittently x 5 days.  Patient had lab work done at [name] and was sent to ED for evaluation of elevated D dimer.  Patient is AAOx3.  Skin warm and dry. NAD

## 2019-01-22 ENCOUNTER — Encounter: Admit: 2019-01-22 | Discharge: 2019-01-23 | Payer: MEDICARE

## 2019-01-22 DIAGNOSIS — Z79899 Other long term (current) drug therapy: Secondary | ICD-10-CM | POA: Diagnosis not present

## 2019-01-22 DIAGNOSIS — R05 Cough: Secondary | ICD-10-CM | POA: Diagnosis not present

## 2019-01-22 DIAGNOSIS — L408 Other psoriasis: Secondary | ICD-10-CM | POA: Diagnosis not present

## 2019-01-22 DIAGNOSIS — M150 Primary generalized (osteo)arthritis: Secondary | ICD-10-CM | POA: Diagnosis not present

## 2019-01-22 DIAGNOSIS — M3219 Other organ or system involvement in systemic lupus erythematosus: Secondary | ICD-10-CM | POA: Diagnosis not present

## 2019-01-22 MED ORDER — HYDROXYCHLOROQUINE 200 MG TABLET
ORAL_TABLET | Freq: Two times a day (BID) | ORAL | 3 refills | 0.00000 days | Status: CP
Start: 2019-01-22 — End: ?
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
## 2019-01-23 ENCOUNTER — Ambulatory Visit (INDEPENDENT_AMBULATORY_CARE_PROVIDER_SITE_OTHER): Payer: Medicare Other | Admitting: Family Medicine

## 2019-01-23 ENCOUNTER — Other Ambulatory Visit: Payer: Self-pay

## 2019-01-23 ENCOUNTER — Encounter: Payer: Self-pay | Admitting: Family Medicine

## 2019-01-23 DIAGNOSIS — J8489 Other specified interstitial pulmonary diseases: Secondary | ICD-10-CM

## 2019-01-23 DIAGNOSIS — R0789 Other chest pain: Secondary | ICD-10-CM | POA: Diagnosis not present

## 2019-01-23 DIAGNOSIS — I10 Essential (primary) hypertension: Secondary | ICD-10-CM | POA: Diagnosis not present

## 2019-01-23 MED ORDER — TELMISARTAN 40 MG PO TABS: 40.0000 mg | ORAL_TABLET | Freq: Every day | ORAL | 0 refills | Status: DC

## 2019-01-23 NOTE — Progress Notes (Signed)
Name: Nicole Ballard   MRN: [id number]    DOB: Jun 05, 1956   Date:01/23/2019       Progress Note  Subjective  Chief Complaint  No chief complaint on file.   I connected with  Nicole Ballard  on 01/23/19 at  9:40 AM EDT by a video enabled telemedicine application and verified that I am speaking with the correct person using two identifiers.  I discussed the limitations of evaluation and management by telemedicine and the availability of in person appointments. The patient expressed understanding and agreed to proceed. Staff also discussed with the patient that there may be a patient responsible charge related to this service. Patient Location: at home  Provider Location: [facility]   HPI  Chest pain: she went to Urgent Care on 01/20/2018, she had a positive D-dimer and normal CXR and was sent to [facility], at the emergency department labs were normal , had a CT that showed interstitial disease - she already follows pulmonologist at [facility]. She was given levaquin and tessalon perles for cough and possible sinusitis, however after she spoke to Rheumatologist yesterday she was advised to not take Levaquin ( secondary to unlikely bacterial cause and interaction with Plaquenil) continue the tessalon perles and follow up with pulmonologist in early June. Patient states still has some chest discomfort but intermittent and feeling much better than when she went to [facility] two days ago. EC also recommended follow up with cardiologist - we will place a referral today   HTN: taking norvasc 2.5 mg BID, micardis 40 mg even though the Norvasc in not on our active list and she checked the expiration and it was written 07/2017. She denies palpitation, she has chronic dizziness and also nausea so difficulty to say if secondary to norvasc . Needs to follow up   Patient Active Problem List   Diagnosis Date Noted  . Hyperglycemia 10/23/2017  . Vertigo 09/29/2016  . Bronchiolitis obliterans organizing  pneumonia ([place]) 09/29/2016  . Neck pain, musculoskeletal 12/15/2015  . Anxiety about health 09/29/2015  . Abnormal CAT scan 04/11/2015  . Auditory impairment 04/11/2015  . Insomnia, persistent 04/11/2015  . Chronic kidney disease (CKD), stage I 04/11/2015  . Chronic nonmalignant pain 04/11/2015  . Chronic constipation 04/11/2015  . Dyslipidemia 04/11/2015  . Fibromyalgia 04/11/2015  . Gastro-esophageal reflux disease without esophagitis 04/11/2015  . Dysphonia 04/11/2015  . Absence of bladder continence 04/11/2015  . Low back pain 04/11/2015  . Eczema intertrigo 04/11/2015  . Keratosis pilaris 04/11/2015  . Chronic recurrent major depressive disorder ([place]) 04/11/2015  . Dysmetabolic syndrome 02/54/2706  . Neurogenic claudication 04/11/2015  . Perennial allergic rhinitis with seasonal variation 04/11/2015  . Abnormal presence of protein in urine 04/11/2015  . Seborrhea capitis 04/11/2015  . Moderate dysplasia of vulva 04/11/2015  . Vitamin D deficiency 04/11/2015  . Treadmill stress test negative for angina pectoris 03/05/2015  . Shortness of breath 05/20/2014  . Morbid obesity ([place]) 02/03/2014  . Asthma, chronic 12/31/2013  . H/O total knee replacement 12/31/2013  . Episcleritis 09/26/2013  . Vocal cord dysfunction 05/28/2013  . Anxiety, generalized 03/19/2013  . Back pain 12/18/2012  . Pulmonary nodules 11/26/2012  . Lung nodule, multiple 11/26/2012  . Arthritis, degenerative 11/01/2012  . H/O aspiration pneumonitis 10/22/2012  . Postinflammatory pulmonary fibrosis ([place]) 10/22/2012  . Mixed incontinence 05/04/2012  . Benign neoplasm of pituitary gland and craniopharyngeal duct ([place]) 12/29/2010  . Lung involvement in systemic lupus erythematosus ([place]) 11/05/2010  . Chronic nausea  07/01/2008  . Benign hypertension 01/25/2005    Past Surgical History:  Procedure Laterality Date  . BRAIN SURGERY    . CHOLECYSTECTOMY    . VAGINAL HYSTERECTOMY      Family History   Problem Relation Age of Onset  . Breast cancer Mother   . Cancer Mother 35       Breast  . Cancer Father 49       Stomach  . Breast cancer Maternal Aunt     Social History   Socioeconomic History  . Marital status: Divorced    Spouse name: Not on file  . Number of children: 2  . Years of education: Not on file  . Highest education level: GED or equivalent  Occupational History  . Occupation: Disability  Social Needs  . Financial resource strain: Somewhat hard  . Food insecurity:    Worry: Sometimes true    Inability: Sometimes true  . Transportation needs:    Medical: No    Non-medical: No  Tobacco Use  . Smoking status: Never Smoker  . Smokeless tobacco: Never Used  Substance and Sexual Activity  . Alcohol use: No    Alcohol/week: 0.0 standard drinks  . Drug use: No  . Sexual activity: Not Currently    Partners: Male  Lifestyle  . Physical activity:    Days per week: 4 days    Minutes per session: 40 min  . Stress: Not at all  Relationships  . Social connections:    Talks on phone: Three times a week    Gets together: Never    Attends religious service: More than 4 times per year    Active member of club or organization: Yes    Attends meetings of clubs or organizations: More than 4 times per year    Relationship status: Divorced  . Intimate partner violence:    Fear of current or ex partner: No    Emotionally abused: No    Physically abused: No    Forced sexual activity: No  Other Topics Concern  . Not on file  Social History Narrative  . Not on file     Current Outpatient Medications:  .  aspirin EC 81 MG tablet, Take 1 tablet (81 mg total) by mouth daily., Disp: 30 tablet, Rfl: 0 .  atorvastatin (LIPITOR) 40 MG tablet, TAKE 1 TABLET BY MOUTH  DAILY, Disp: 90 tablet, Rfl: 1 .  benzonatate (TESSALON PERLES) 100 MG capsule, Take 1-2 capsules (100-200 mg total) by mouth 3 (three) times daily as needed., Disp: 40 capsule, Rfl: 0 .  buPROPion  (WELLBUTRIN XL) 300 MG 24 hr tablet, Take 1 tablet by mouth daily., Disp: , Rfl:  .  calcipotriene (DOVONOX) 0.005 % ointment, APP TO RASH AREAS IN FOLDS QOD AS MAINTENANCE., Disp: , Rfl:  .  Cholecalciferol (VITAMIN D) 2000 UNITS CAPS, Take 1 capsule by mouth daily., Disp: , Rfl:  .  clobetasol ointment (TEMOVATE) 0.05 %, Apply topically., Disp: , Rfl:  .  clonazePAM (KLONOPIN) 0.5 MG tablet, Take 0.5 tablets (0.25 mg total) by mouth daily., Disp: 15 tablet, Rfl: 0 .  cyclobenzaprine (FLEXERIL) 5 MG tablet, TAKE 1 TABLET BY MOUTH  EVERY 8 HOURS AS NEEDED FOR MUSCLE SPASMS. DO NOT DRIVE FOR 8 HOURS, Disp: 90 tablet, Rfl: 1 .  diclofenac sodium (VOLTAREN) 1 % GEL, APP 2 GRAMS EXT AA QID, Disp: , Rfl: 6 .  docusate sodium (COLACE) 100 MG capsule, Take by mouth., Disp: , Rfl:  .  DULoxetine (CYMBALTA) 60 MG capsule, Take 120 mg by mouth at bedtime. , Disp: , Rfl:  .  fexofenadine (ALLEGRA ALLERGY) 180 MG tablet, Take 1 tablet (180 mg total) by mouth daily. (Patient not taking: Reported on 12/11/2018), Disp: 90 tablet, Rfl: 1 .  fluticasone (FLONASE) 50 MCG/ACT nasal spray, Place 2 sprays into both nostrils daily., Disp: 48 g, Rfl: 3 .  gabapentin (NEURONTIN) 300 MG capsule, Take 1 capsule by mouth at bedtime., Disp: , Rfl:  .  hydroxychloroquine (PLAQUENIL) 200 MG tablet, Take 200 mg by mouth 2 (two) times daily., Disp: , Rfl:  .  ketoconazole (NIZORAL) 2 % cream, Apply 1 application topically daily., Disp: 15 g, Rfl: 0 .  lamoTRIgine (LAMICTAL) 200 MG tablet, Take 1 tablet (200 mg total) by mouth 2 (two) times daily., Disp: 60 tablet, Rfl: 0 .  meclizine (ANTIVERT) 25 MG tablet, Take 1 tablet (25 mg total) by mouth 3 (three) times daily as needed., Disp: 30 tablet, Rfl: 0 .  Multiple Vitamin (MULTIVITAMIN) tablet, Take 1 tablet by mouth daily., Disp: , Rfl:  .  mycophenolate (MYFORTIC) 360 MG TBEC EC tablet, Take 1 tablet by mouth 2 (two) times daily., Disp: , Rfl:  .  olopatadine (PATANOL) 0.1 %  ophthalmic solution, Place 1 drop into both eyes 2 (two) times daily., Disp: 5 mL, Rfl: 3 .  omeprazole (PRILOSEC) 40 MG capsule, TAKE 1 CAPSULE BY MOUTH  DAILY, Disp: 90 capsule, Rfl: 1 .  ondansetron (ZOFRAN) 4 MG tablet, TAKE 1 TABLET BY MOUTH  EVERY 8 HOURS AS NEEDED FOR NAUSEA AND VOMITING, Disp: 60 tablet, Rfl: 0 .  prednisoLONE acetate (PRED FORTE) 1 % ophthalmic suspension, Place 1 drop into both eyes 2 (two) times daily as needed., Disp: , Rfl: 0 .  telmisartan (MICARDIS) 40 MG tablet, Take 1 tablet (40 mg total) by mouth daily., Disp: 90 tablet, Rfl: 0 .  traZODone (DESYREL) 100 MG tablet, Take 1 tablet by mouth at bedtime., Disp: , Rfl:  .  triamcinolone ointment (KENALOG) 0.1 %, , Disp: , Rfl:  .  YUVAFEM 10 MCG TABS vaginal tablet, Place 1 tablet vaginally once a week., Disp: , Rfl:   Allergies  Allergen Reactions  . Ace Inhibitors     Other reaction(s): OTHER Pt states she can not take ace inhibitors. Pt states she can not remember her reaction.   . Bee Pollen   . Penicillins   . Pollen Extract     I personally reviewed active problem list, medication list, allergies, family history with the patient/caregiver today.   ROS  Ten systems reviewed and is negative except as mentioned in HPI   Objective  Virtual encounter, vitals not obtained.   Physical Exam  Awake, alert and oriented   Results for orders placed or performed during the hospital encounter of 01/21/19 (from the past 72 hour(s))  CBC     Status: None   Collection Time: 01/21/19  6:25 PM  Result Value Ref Range   WBC 4.9 4.0 - 10.5 K/uL   RBC 4.98 3.87 - 5.11 MIL/uL   Hemoglobin 13.5 12.0 - 15.0 g/dL   HCT 42.7 36.0 - 46.0 %   MCV 85.7 80.0 - 100.0 fL   MCH 27.1 26.0 - 34.0 pg   MCHC 31.6 30.0 - 36.0 g/dL   RDW 13.3 11.5 - 15.5 %   Platelets 339 150 - 400 K/uL   nRBC 0.0 0.0 - 0.2 %    Comment: Performed at [facility]  Lab, [address] [place], [place] [address]  Basic metabolic panel      Status: None   Collection Time: 01/21/19  6:25 PM  Result Value Ref Range   Sodium 138 135 - 145 mmol/L   Potassium 4.6 3.5 - 5.1 mmol/L   Chloride 102 98 - 111 mmol/L   CO2 30 22 - 32 mmol/L   Glucose, Bld 79 70 - 99 mg/dL   BUN 14 8 - 23 mg/dL   Creatinine, Ser 0.72 0.44 - 1.00 mg/dL   Calcium 9.0 8.9 - 10.3 mg/dL   GFR calc non Af Amer >60 >60 mL/min   GFR calc Af Amer >60 >60 mL/min   Anion gap 6 5 - 15    Comment: Performed at [facility], [address] [place], [place] [address]  Troponin I - ONCE - STAT     Status: None   Collection Time: 01/21/19  6:25 PM  Result Value Ref Range   Troponin I <0.03 <0.03 ng/mL    Comment: Performed at [facility], [address] [place], [place] [address]  Lipase, blood     Status: None   Collection Time: 01/21/19  6:25 PM  Result Value Ref Range   Lipase 26 11 - 51 U/L    Comment: Performed at [facility], [place], [place] [address]    PHQ2/9: Depression screen [facility] 2/9 12/11/2018 11/20/2018 09/28/2018 09/11/2018 09/06/2018  Decreased Interest 0 0 0 0 0  Down, Depressed, Hopeless 0 0 0 0 0  PHQ - 2 Score 0 0 0 0 0  Altered sleeping 0 0 1 0 0  Tired, decreased energy 0 1 2 0 0  Change in appetite 0 0 0 0 0  Feeling bad or failure about yourself  0 0 0 0 0  Trouble concentrating 0 0 1 0 0  Moving slowly or fidgety/restless 0 0 1 0 0  Suicidal thoughts 0 0 0 0 0  PHQ-9 Score 0 1 5 0 0  Difficult doing work/chores Not difficult at all Not difficult at all Somewhat difficult Not difficult at all Not difficult at all  Some recent data might be hidden   PHQ-2/9 Result is negative.    Fall Risk: Fall Risk  12/11/2018 11/20/2018 09/28/2018 09/11/2018 09/06/2018  Falls in the past year? 1 1 1 1 1   Number falls in past yr: 1 1 1 1  0  Comment - - - - -  Injury with Fall? 0 0 0 0 0  Risk for fall due to : - - Impaired balance/gait - -  Risk for fall due to: Comment - - Vertigo - -  Follow up -  Falls evaluation completed - - -     Assessment & Plan  1. Chest discomfort  - Ambulatory referral to Cardiology  2. Organizing pneumonia [facility])  Keep follow up with pul[facility]ologist at [facility]  3. Benign hypertension  Continue micardis, she states she has also been taking Norvasc 2.5 mg ( but rx is from 07/2017 ) explained not on our active medication list, needs to stop and we will need to check her bp in our office. Explained she needs to come in for a face to face visit with all medications for medication reconciliation   I discussed the assessment and treatment plan with the patient. The patient was provided an opportunity to ask questions and all were answered. The patient agreed with the plan and demonstrated an understanding of  the instructions.  The patient was advised to call back or seek an in-person evaluation if the symptoms worsen or if the condition fails to improve as anticipated.  I provided 25 minutes of non-face-to-face time during this encounter.

## 2019-01-24 ENCOUNTER — Other Ambulatory Visit: Payer: Self-pay

## 2019-01-24 NOTE — Patient Outreach (Signed)
[facility]) Care Management  01/24/2019  Nicole Ballard June 25, 1956 [id number]   Medication Adherence call to Nicole Ballard [place] Identifiers Verify spoke with patient she is due on Atorvastatin 40 mg she explain she is taking 1 tablet daily but ask if we can call [facility] to order this medication,[facility] said patient has a balance and not order until she cancel the balance,patient is aware and will call [facility].Nicole Ballard is showing past due under [place].   [facility] Management Direct Dial [phone]  Fax [phone] [email or web address]

## 2019-01-25 DIAGNOSIS — I10 Essential (primary) hypertension: Secondary | ICD-10-CM | POA: Diagnosis not present

## 2019-01-25 DIAGNOSIS — R079 Chest pain, unspecified: Secondary | ICD-10-CM | POA: Diagnosis not present

## 2019-01-28 ENCOUNTER — Other Ambulatory Visit: Payer: Self-pay | Admitting: Family Medicine

## 2019-01-28 DIAGNOSIS — I10 Essential (primary) hypertension: Secondary | ICD-10-CM

## 2019-02-05 ENCOUNTER — Telehealth: Payer: Self-pay

## 2019-02-05 NOTE — Telephone Encounter (Signed)
Copied from [place] [phone]. Topic: Appointment Scheduling - Scheduling Inquiry for Clinic >> Feb 05, 2019 12:42 PM Alanda Slim E wrote: Reason for CRM: Pt wants to know if she needs to keep her upcoming appt due to her already speaking with the nurse and going over her meds

## 2019-02-05 NOTE — Telephone Encounter (Signed)
Pt informed

## 2019-02-06 ENCOUNTER — Telehealth: Admit: 2019-02-06 | Discharge: 2019-02-07 | Payer: MEDICARE | Attending: Pulmonary Disease | Primary: Pulmonary Disease

## 2019-02-06 DIAGNOSIS — J8410 Pulmonary fibrosis, unspecified: Secondary | ICD-10-CM | POA: Diagnosis not present

## 2019-02-06 NOTE — Unmapped (Signed)
We will track down the images from CT scan at [facility]. We will schedule repeat breathing tests. We will let you know next steps based on the results of both. Hopefully, the images and breathing tests will not be much different from your prior studies.    Please take your medications as prescribed.  Thank you for allowing me to be a part of your care.  Please call the clinic with any questions.    Vilma Meckel, MD  Pulmonary and Critical Care Medicine  [address]  [id number]  [place], [place] [address]    Thank you for your visit to the [facility].  You may receive a survey from [facility] regarding your visit today, and we are eager to use this feedback to improve your experience.  Thank you for taking the time to fill it out.    Between appointments, you can reach [place] at these numbers:    For appointments or the Pulmonary Nurse: [phone]  Fax: [phone]    For urgent issues after hours:  Hospital Operator: [phone], ask for Pulmonary Fellow on call
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
## 2019-02-07 ENCOUNTER — Ambulatory Visit (INDEPENDENT_AMBULATORY_CARE_PROVIDER_SITE_OTHER): Payer: Medicare Other | Admitting: Family Medicine

## 2019-02-07 ENCOUNTER — Encounter: Payer: Self-pay | Admitting: Family Medicine

## 2019-02-07 ENCOUNTER — Other Ambulatory Visit: Payer: Self-pay

## 2019-02-07 VITALS — BP 128/68 | HR 81 | Temp 98.0°F | Resp 16 | Ht 68.0 in | Wt 198.5 lb

## 2019-02-07 DIAGNOSIS — Z79899 Other long term (current) drug therapy: Secondary | ICD-10-CM | POA: Diagnosis not present

## 2019-02-07 DIAGNOSIS — I10 Essential (primary) hypertension: Secondary | ICD-10-CM

## 2019-02-07 DIAGNOSIS — E669 Obesity, unspecified: Secondary | ICD-10-CM

## 2019-02-07 DIAGNOSIS — F339 Major depressive disorder, recurrent, unspecified: Secondary | ICD-10-CM

## 2019-02-07 DIAGNOSIS — E785 Hyperlipidemia, unspecified: Secondary | ICD-10-CM | POA: Diagnosis not present

## 2019-02-07 DIAGNOSIS — E1169 Type 2 diabetes mellitus with other specified complication: Secondary | ICD-10-CM

## 2019-02-07 MED ORDER — BUPROPION HCL ER (XL) 150 MG PO TB24: 150.0000 mg | ORAL_TABLET | Freq: Every day | ORAL | 0 refills | Status: DC

## 2019-02-07 MED ORDER — BIOTIN 1 MG PO CAPS: 1.0000 | ORAL_CAPSULE | Freq: Every day | ORAL | 0 refills | Status: AC

## 2019-02-07 MED ORDER — TELMISARTAN 40 MG PO TABS: 40.0000 mg | ORAL_TABLET | Freq: Every day | ORAL | 1 refills | Status: DC

## 2019-02-07 NOTE — Progress Notes (Signed)
Name: Nicole Ballard   MRN: [id number]    DOB: 09-Jun-1956   Date:02/07/2019       Progress Note  Subjective  Chief Complaint  Chief Complaint  Patient presents with  . Follow-up    2 week F/U  . Hypertension    Denies any symptoms    HPI  HTN: Nicole Ballard came in today for medication reconciliation, on her last telemedicine visit she was confused about taking losartan and micardis. She brought all her medications to the visit today, she is currently on micardis only and we added a medication to her list from pulmonologist. She has chest pain and has seen cardiologist, she has been scheduled for a stress test. She states pain is substernal, sometimes associated with sob but no diaphoresis and it can happen during rest. She states it lasts a few minutes.   DM: denies polyphagia, polyuria or polydipsia.   MDD: under the care of psychiatrist, she is currently on lower dose of wellbutrin, titrated down from 300 mg to 150 mg on her last visit and it was updated on her chart. Phq9 reviewed  Dyslipidemia: she is due for labs today, denies myalgia secondary to statin therapy   Patient Active Problem List   Diagnosis Date Noted  . Hyperglycemia 10/23/2017  . Vertigo 09/29/2016  . Bronchiolitis obliterans organizing pneumonia (Nicole Ballard) 09/29/2016  . Neck pain, musculoskeletal 12/15/2015  . Anxiety about health 09/29/2015  . Abnormal CAT scan 04/11/2015  . Auditory impairment 04/11/2015  . Insomnia, persistent 04/11/2015  . Chronic kidney disease (CKD), stage I 04/11/2015  . Chronic nonmalignant pain 04/11/2015  . Chronic constipation 04/11/2015  . Dyslipidemia 04/11/2015  . Fibromyalgia 04/11/2015  . Gastro-esophageal reflux disease without esophagitis 04/11/2015  . Dysphonia 04/11/2015  . Absence of bladder continence 04/11/2015  . Low back pain 04/11/2015  . Eczema intertrigo 04/11/2015  . Keratosis pilaris 04/11/2015  . Chronic recurrent major depressive disorder (Nicole Ballard) 04/11/2015   . Dysmetabolic syndrome 58/52/7782  . Neurogenic claudication 04/11/2015  . Perennial allergic rhinitis with seasonal variation 04/11/2015  . Abnormal presence of protein in urine 04/11/2015  . Seborrhea capitis 04/11/2015  . Moderate dysplasia of vulva 04/11/2015  . Vitamin D deficiency 04/11/2015  . Treadmill stress test negative for angina pectoris 03/05/2015  . Shortness of breath 05/20/2014  . Morbid obesity (Nicole Ballard) 02/03/2014  . Asthma, chronic 12/31/2013  . H/O total knee replacement 12/31/2013  . Episcleritis 09/26/2013  . Vocal cord dysfunction 05/28/2013  . Anxiety, generalized 03/19/2013  . Back pain 12/18/2012  . Pulmonary nodules 11/26/2012  . Lung nodule, multiple 11/26/2012  . Arthritis, degenerative 11/01/2012  . H/O aspiration pneumonitis 10/22/2012  . Postinflammatory pulmonary fibrosis (Nicole Ballard) 10/22/2012  . Mixed incontinence 05/04/2012  . Benign neoplasm of pituitary gland and craniopharyngeal duct (Nicole Ballard) 12/29/2010  . Benign neoplasm of pituitary gland (Nicole Ballard) 12/29/2010  . Lung involvement in systemic lupus erythematosus (Nicole Ballard) 11/05/2010  . Chronic nausea 07/01/2008  . Benign hypertension 01/25/2005    Past Surgical History:  Procedure Laterality Date  . BRAIN SURGERY    . CHOLECYSTECTOMY    . VAGINAL HYSTERECTOMY      Family History  Problem Relation Age of Onset  . Breast cancer Mother   . Cancer Mother 8       Breast  . Cancer Father 18       Stomach  . Breast cancer Maternal Aunt     Social History   Socioeconomic History  . Marital status: Divorced  Spouse name: Not on file  . Number of children: 2  . Years of education: Not on file  . Highest education level: GED or equivalent  Occupational History  . Occupation: Disability  Social Needs  . Financial resource strain: Somewhat hard  . Food insecurity:    Worry: Sometimes true    Inability: Sometimes true  . Transportation needs:    Medical: No    Non-medical: No  Tobacco Use  .  Smoking status: Never Smoker  . Smokeless tobacco: Never Used  Substance and Sexual Activity  . Alcohol use: No    Alcohol/week: 0.0 standard drinks  . Drug use: No  . Sexual activity: Not Currently    Partners: Male  Lifestyle  . Physical activity:    Days per week: 4 days    Minutes per session: 40 min  . Stress: Not at all  Relationships  . Social connections:    Talks on phone: Three times a week    Gets together: Never    Attends religious service: More than 4 times per year    Active member of club or organization: Yes    Attends meetings of clubs or organizations: More than 4 times per year    Relationship status: Divorced  . Intimate partner violence:    Fear of current or ex partner: No    Emotionally abused: No    Physically abused: No    Forced sexual activity: No  Other Topics Concern  . Not on file  Social History Narrative  . Not on file     Current Outpatient Medications:  .  aspirin EC 81 MG tablet, Take 1 tablet (81 mg total) by mouth daily., Disp: 30 tablet, Rfl: 0 .  atorvastatin (LIPITOR) 40 MG tablet, TAKE 1 TABLET BY MOUTH  DAILY, Disp: 90 tablet, Rfl: 1 .  Cholecalciferol (VITAMIN D) 2000 UNITS CAPS, Take 1 capsule by mouth daily., Disp: , Rfl:  .  clobetasol ointment (TEMOVATE) 0.05 %, Apply topically., Disp: , Rfl:  .  clonazePAM (KLONOPIN) 0.5 MG tablet, Take 0.5 tablets (0.25 mg total) by mouth daily., Disp: 15 tablet, Rfl: 0 .  cyclobenzaprine (FLEXERIL) 5 MG tablet, TAKE 1 TABLET BY MOUTH  EVERY 8 HOURS AS NEEDED FOR MUSCLE SPASMS. DO NOT DRIVE FOR 8 HOURS, Disp: 90 tablet, Rfl: 1 .  diclofenac sodium (VOLTAREN) 1 % GEL, APP 2 GRAMS EXT AA QID, Disp: , Rfl: 6 .  DULoxetine (CYMBALTA) 60 MG capsule, Take 120 mg by mouth at bedtime. , Disp: , Rfl:  .  fluticasone (FLONASE) 50 MCG/ACT nasal spray, Place 2 sprays into both nostrils daily., Disp: 48 g, Rfl: 3 .  gabapentin (NEURONTIN) 300 MG capsule, Take 1 capsule by mouth at bedtime., Disp: , Rfl:   .  hydroxychloroquine (PLAQUENIL) 200 MG tablet, Take 200 mg by mouth 2 (two) times daily., Disp: , Rfl:  .  ketoconazole (NIZORAL) 2 % cream, Apply 1 application topically daily., Disp: 15 g, Rfl: 0 .  lamoTRIgine (LAMICTAL) 200 MG tablet, Take 1 tablet (200 mg total) by mouth 2 (two) times daily., Disp: 60 tablet, Rfl: 0 .  meclizine (ANTIVERT) 25 MG tablet, Take 1 tablet (25 mg total) by mouth 3 (three) times daily as needed., Disp: 30 tablet, Rfl: 0 .  mycophenolate (MYFORTIC) 360 MG TBEC EC tablet, Take 360 mg by mouth 2 (two) times daily., Disp: , Rfl:  .  olopatadine (PATANOL) 0.1 % ophthalmic solution, Place 1 drop into both eyes 2 (  two) times daily., Disp: 5 mL, Rfl: 3 .  omeprazole (PRILOSEC) 40 MG capsule, TAKE 1 CAPSULE BY MOUTH  DAILY, Disp: 90 capsule, Rfl: 1 .  ondansetron (ZOFRAN) 4 MG tablet, TAKE 1 TABLET BY MOUTH  EVERY 8 HOURS AS NEEDED FOR NAUSEA AND VOMITING, Disp: 60 tablet, Rfl: 0 .  Polyethylene Glycol 3350 (MIRALAX PO), Take by mouth., Disp: , Rfl:  .  telmisartan (MICARDIS) 40 MG tablet, TAKE 1 TABLET(40 MG) BY MOUTH DAILY, Disp: 90 tablet, Rfl: 0 .  traZODone (DESYREL) 100 MG tablet, Take 1 tablet by mouth at bedtime., Disp: , Rfl:  .  triamcinolone ointment (KENALOG) 0.1 %, , Disp: , Rfl:  .  Biotin 1 MG CAPS, Take 1 capsule by mouth daily., Disp: 30 capsule, Rfl: 0 .  buPROPion (WELLBUTRIN XL) 150 MG 24 hr tablet, Take 1 tablet (150 mg total) by mouth daily., Disp: 30 tablet, Rfl: 0  Allergies  Allergen Reactions  . Ace Inhibitors     Other reaction(s): OTHER Pt states she can not take ace inhibitors. Pt states she can not remember her reaction.   . Bee Pollen   . Penicillins   . Pollen Extract     I personally reviewed active problem list, medication list, allergies, family history, social history with the patient/caregiver today.   ROS  Constitutional: Negative for fever or weight change.  Respiratory: Negative for cough but has intermittent shortness  of breath.   Cardiovascular: Positive  for chest pain but no  palpitations.  Gastrointestinal: Negative for abdominal pain, no bowel changes.  Musculoskeletal: Negative for gait problem or joint swelling.  Skin: Negative for rash.  Neurological: Positive  For intermittent  dizziness and headache.  No other specific complaints in a complete review of systems (except as listed in HPI above).   Objective  Vitals:   02/07/19 1057  BP: 128/68  Pulse: 81  Resp: 16  Temp: 98 F (36.7 C)  TempSrc: Oral  SpO2: 97%  Weight: 198 lb 8 oz (90 kg)  Height: 5\' 8"  (1.727 m)    Body mass index is 30.18 kg/m.  Physical Exam  Constitutional: Patient appears well-developed and well-nourished. Obese  No distress.  HEENT: head atraumatic, normocephalic, pupils equal and reactive to light, neck supple,surgical mask  Cardiovascular: Normal rate, regular rhythm and normal heart sounds.  No murmur heard. No BLE edema. Pulmonary/Chest: Effort normal and breath sounds normal. No respiratory distress. Abdominal: Soft.  There is no tenderness. Psychiatric: Patient has a normal mood and affect. behavior is normal. Judgment and thought content normal.  Recent Results (from the past 2160 hour(s))  Fibrin derivatives D-Dimer ([place] only)     Status: Abnormal   Collection Time: 01/21/19  2:41 PM  Result Value Ref Range   Fibrin derivatives D-dimer ([facility]) 649.14 (H) 0.00 - 499.00 ng/mL (FEU)    Comment: (NOTE) <> Exclusion of Venous Thromboembolism (VTE) - OUTPATIENT ONLY   (Emergency Department or [place])   0-499 ng/ml (FEU): With a low to intermediate pretest probability                      for VTE this test result excludes the diagnosis                      of VTE.   >499 ng/ml (FEU) : VTE not excluded; additional work up for VTE is  required. <> Testing on Inpatients and Evaluation of Disseminated Intravascular   Coagulation (DIC) Reference Range:   0-499 ng/ml (FEU) Performed  at [facility], [address] [place], [place] [address]   Troponin I - Once     Status: None   Collection Time: 01/21/19  2:41 PM  Result Value Ref Range   Troponin I <0.03 <0.03 ng/mL    Comment: Performed at [facility], [address] [place], [place] [address]  Brain natriuretic peptide     Status: None   Collection Time: 01/21/19  2:41 PM  Result Value Ref Range   B Natriuretic Peptide 7.0 0.0 - 100.0 pg/mL    Comment: Performed at [facility], [address] [place], [place] [address]  CBC     Status: None   Collection Time: 01/21/19  6:25 PM  Result Value Ref Range   WBC 4.9 4.0 - 10.5 K/uL   RBC 4.98 3.87 - 5.11 MIL/uL   Hemoglobin 13.5 12.0 - 15.0 g/dL   HCT 42.7 36.0 - 46.0 %   MCV 85.7 80.0 - 100.0 fL   MCH 27.1 26.0 - 34.0 pg   MCHC 31.6 30.0 - 36.0 g/dL   RDW 13.3 11.5 - 15.5 %   Platelets 339 150 - 400 K/uL   nRBC 0.0 0.0 - 0.2 %    Comment: Performed at [facility], [address] [place], [place] [address]  Basic metabolic panel     Status: None   Collection Time: 01/21/19  6:25 PM  Result Value Ref Range   Sodium 138 135 - 145 mmol/L   Potassium 4.6 3.5 - 5.1 mmol/L   Chloride 102 98 - 111 mmol/L   CO2 30 22 - 32 mmol/L   Glucose, Bld 79 70 - 99 mg/dL   BUN 14 8 - 23 mg/dL   Creatinine, Ser 0.72 0.44 - 1.00 mg/dL   Calcium 9.0 8.9 - 10.3 mg/dL   GFR calc non Af Amer >60 >60 mL/min   GFR calc Af Amer >60 >60 mL/min   Anion gap 6 5 - 15    Comment: Performed at [facility], [address] [place], [place] [address]  Troponin I - ONCE - STAT     Status: None   Collection Time: 01/21/19  6:25 PM  Result Value Ref Range   Troponin I <0.03 <0.03 ng/mL    Comment: Performed at [facility], [address] [place], [place] [address]  Lipase, blood     Status: None   Collection Time: 01/21/19  6:25 PM  Result Value Ref Range   Lipase 26 11 - 51 U/L    Comment: Performed at [facility], [address] [place], [place] [address]     PHQ2/9: Depression screen [facility] 2/9 02/07/2019 12/11/2018 11/20/2018 09/28/2018 09/11/2018  Decreased Interest 0 0 0 0 0  Down, Depressed, Hopeless 0 0 0 0 0  PHQ - 2 Score 0 0 0 0 0  Altered sleeping 0 0 0 1 0  Tired, decreased energy 0 0 1 2 0  Change in appetite 0 0 0 0 0  Feeling bad or failure about yourself  0 0 0 0 0  Trouble concentrating 0 0 0 1 0  Moving slowly or fidgety/restless 0 0 0 1 0  Suicidal thoughts 0 0 0 0 0  PHQ-9 Score 0 0 1 5 0  Difficult doing work/chores Not difficult at all Not difficult  at all Not difficult at all Somewhat difficult Not difficult at all  Some recent data might be hidden    phq 9 is negative  Fall Risk: Fall Risk  02/07/2019 12/11/2018 11/20/2018 09/28/2018 09/11/2018  Falls in the past year? 1 1 1 1 1   Number falls in past yr: 1 1 1 1 1   Comment - - - - -  Injury with Fall? 0 0 0 0 0  Risk for fall due to : - - - Impaired balance/gait -  Risk for fall due to: Comment - - - Vertigo -  Follow up - - Falls evaluation completed - -     Functional Status Survey: Is the patient deaf or have difficulty hearing?: No Does the patient have difficulty seeing, even when wearing glasses/contacts?: Yes Does the patient have difficulty concentrating, remembering, or making decisions?: No Does the patient have difficulty walking or climbing stairs?: No Does the patient have difficulty dressing or bathing?: No Does the patient have difficulty doing errands alone such as visiting a doctor's office or shopping?: No    Assessment & Plan  1. Benign hypertension  - telmisartan (MICARDIS) 40 MG tablet; Take 1 tablet (40 mg total) by mouth daily.  Dispense: 90 tablet; Refill: 1  2. Diabetes mellitus type 2 in obese (HCC)  - Hemoglobin A1c  3. Chronic recurrent major depressive disorder (HCC)  - buPROPion (WELLBUTRIN XL) 150 MG 24 hr tablet; Take 1 tablet (150 mg total) by mouth daily.  Dispense:  30 tablet; Refill: 0  4. Chronic medication management   5. Dyslipidemia  - Lipid panel

## 2019-02-08 DIAGNOSIS — R079 Chest pain, unspecified: Secondary | ICD-10-CM | POA: Diagnosis not present

## 2019-02-08 LAB — LIPID PANEL
Cholesterol: 143 mg/dL (ref <200)
HDL: 63 mg/dL (ref >=50)
LDL Cholesterol (Calc): 69 mg/dL (calc)
Non-HDL Cholesterol (Calc): 80 mg/dL (calc) (ref <130)
Total CHOL/HDL Ratio: 2.3 (calc) (ref <5.0)
Triglycerides: 39 mg/dL (ref <150)

## 2019-02-08 LAB — HEMOGLOBIN A1C
Hgb A1c MFr Bld: 5.4 % of total Hgb (ref <5.7)
Mean Plasma Glucose: 108 (calc)
eAG (mmol/L): 6 (calc)

## 2019-02-11 DIAGNOSIS — I10 Essential (primary) hypertension: Secondary | ICD-10-CM | POA: Diagnosis not present

## 2019-02-11 DIAGNOSIS — E785 Hyperlipidemia, unspecified: Secondary | ICD-10-CM | POA: Diagnosis not present

## 2019-02-11 DIAGNOSIS — R079 Chest pain, unspecified: Secondary | ICD-10-CM | POA: Diagnosis not present

## 2019-02-17 NOTE — Unmapped (Signed)
Department of Anesthesiology  [facility]  [address]  [place], [place] [address]  [phone]    I spent 10 minutes on the audio/video with the patient. I spent an additional 10 minutes on pre- and post-visit activities.   The patient consented to this consult.    The patient was physically located in [place] or a state in which I am permitted to provide care. The patient understood that s/he may incur co-pays and cost sharing, and agreed to the telemedicine visit. The visit was completed via phone and/or video, which was appropriate and reasonable under the circumstances given the patient's presentation at the time.    The patient has been advised of the potential risks and limitations of this mode of treatment (including, but not limited to, the absence of in-person examination) and has agreed to be treated using telemedicine. The patient's/patient's family's questions regarding telemedicine have been answered. No vitals or physical exam was performed but the previous exam was copied forward in this note for continuity.     If the phone/video visit was completed in an ambulatory setting, the patient has also been advised to contact their provider???s office for worsening conditions, and seek emergency medical treatment and/or call 911 if the patient deems either necessary.    Visit modifiers:   POS 02 and 95 (virtual visit with video)    -Location of patient during visit: [place]  -Names of all people present during visit: [name], Dorene Ar, MD, Dominika Regino Schultze,*        Chronic Pain Follow Up Note      Assessment:     1. Primary osteoarthritis involving multiple joints    2. Fibromyalgia    3. Chronic pain syndrome    4. Cervicalgia    5. Chronic midline low back pain    6. Spinal stenosis of lumbar region, unspecified whether neurogenic claudication present    7. Current chronic use of systemic steroids      Caitlin Smith is a 63 y.o. being followed at [facility] for complaint of chronic pain localized to right knee and bilateral neck, shoulders and upper back myofascial pain and fibromyalgia, and lower back 2/2 lumbar facet arthropathy and DDD and moderate degree of lumbar central spinal canal stenosis. Patient has a history of chronic pain related to lupus and arthritis and is on rheumatological therapy.     At last visit, patient endorsed worsened pain in her bilateral shoulders and knees that was adequately managed by Voltaren gel, tylenol, and occasionally ibuprofen. At this time, she was restarted on gabapentin 300mg  TID and started on lidocaine patches. Today, patient reports continues pain in shoulders, knees, and low back consistent with previously diagnosed pain generators. Discussed procedural interventions (genicular, MBBs) however will defer at this time as patient is immunosuppressed. Will provide refills as needed an have pt f/u in 64mo.    Medication Monitoring:   Last pain medication agreement on file and signed on  N/A.  Last urine toxicology:  N/A.    NCCSRS database was reviewed today and is appropriate.  There are no aberrant drug related behaviors observed.  Oral Morphine Equivalents: 0  On a Benzodiazepine: yes , clonazepam managed by psychiatry  Naloxone Ordered: no   Pain psychology: Yes    Plan:     ?? Continue gabapentin 300 mg TID, refilled   ?? Pt to use OTC lidocaine patches if desired  ?? Continue Flexeril,  voltaren Gel, refilled  ?? Continue pain psychology.  ?? Continue activity as able  ?? Follow up in 3 months    Future considerations:  Genicular / lumbar MBBs when COVID resolved (pt immunosuppressed)  MBB  PT    Requested Prescriptions     Signed Prescriptions Disp Refills   ??? diclofenac sodium (VOLTAREN) 1 % gel 300 g 5     Sig: Apply 4 g topically Four (4) times a day.   ??? gabapentin (NEURONTIN) 300 MG capsule 90 capsule 2     Sig: Take 1 capsule (300 mg total) by mouth Three (3) times a day.   ??? cyclobenzaprine (FLEXERIL) 5 MG tablet 60 tablet 2     Sig: Take 1 tablet (5 mg total) by mouth two (2) times a day as needed.       No orders of the defined types were placed in this encounter.    Risks and benefits of above medications including but not limited to possibility of respiratory depression, sedation, and even death were discussed with the patient who expressed an understanding.     I have personally reviewed the patient's medical record.   The patient's imaging studies were reviewed at this visit    Subjective:     History of Present Illness  Caitlin Smith is a 64 y.o. being followed at [facility] for complaint of chronic pain localized to right knee and bilateral neck, shoulder, upper back, and lower back 2/2 lumbar facet arthropathy and DDD. Patient has a history of chronic pain related to lupus, arthritis, and fibromyalgia.     At last visit (12/03/2018) with Dr. Doylene Canard, patient endorsed fair analgesia from her medication regimen though reported worsened pain in her shoulders and knees. For her pain, she used Voltaren gel, Tylenol, and occasionally ibuprofen. At this time, she was restarted on gabapentin though provided a lower dose as she previously endorsed dizziness with higher doses. She was also started on Lidocaine patches, however she was not able to obtain (unclear why). Today, patient reports pain in bilateral shoulders, knees, and low back (with occasional radiation to BLE ,above the knee). She is s/p R TKR. Has had knee injections prior to her knee replacement. Both knees currently hurt, but R>L. She tries to exercise regularly, but has pain flares after she exercises. On ROS endorses urinary urgency.     The patient's medication regimen:  Gabapentin 300 mg TID  Cymbalta 120mg  qhs (PCP)  Voltaren gel prn  - likely issues with insurance coverage       Current view: Showing all answers    Legend:         Triggered a BPA  Scoring question                     [facility] Return Patient Questionnaire     Question 02/18/2019  8:59 AM EDT - [name] by Patient on 02/18/2019    What is the reason for your visit?       Date of onset of your pain: 01/04/2019    Please rate your pain at its WORST in the past month. 7    Please rate your pain at its LEAST in the past month. 5    Please rate your pain as it is RIGHT NOW. 7    Please rate your pain on AVERAGE in the past month. 6    Please circle the location of your pain.  Please select the words that describe your pain. Aching     Sharp    How often do you have pain? All the time    When is your pain the worst? During the day    Which of the following have been negatively affected by your pain? General activity     Mood     Recreational activities     Sleep    Since your last visit:     Have you had any of the following? Emergency Room visit     Primary Care Visit     Mental Health visit     Changes in employment    Do you have any new pain you would like to discuss with your doctor? No    How has your pain changed? Stayed the same    Are you currently taking any blood-thinners or anticoagulants? Yes    If you are on Pain Medication ??? Are you having any of the following? Dry mouth     Change in Sleep Patterns    If you have had a procedure since your last visit, how much pain relief was obtained?       If you have had a procedure since your last visit, were there any complications?       General: Night Sweats     Difficulty Sleeping     Daytime Drowsiness    Cardiovascular:       Gastrointestinal - (Intestinal): Nausea     Stomach Pain     Constipation     Gastric Reflux    Skin: Rash     Itching    Endocrine (Hormonal System): Excess Thirst    Musculoskeletal System - (Muscles, Joints and Coverings): Joint Aches/Swelling     Back pain     Muscle Aches/Weakness    Neurologic: Dizziness/Vertigo     Headaches/Migraines    Psychiatric: Depression     Anxiety     Panic Attacks     Lack of interest in activities Medication Monitoring  NCCSRS database was reviewed today   Urine toxicology is not being monitored - patient not on opioids    Allergies  Allergies   Allergen Reactions   ??? Vilazodone Swelling   ??? Ace Inhibitors Other (See Comments)     Pt states she can not take ace inhibitors. Pt states she can not remember her reaction.    ??? Bee Pollen    ??? Pollen Extracts    ??? Penicillin G Rash   ??? Penicillins Rash       Home Medications    Current Outpatient Medications   Medication Sig Dispense Refill   ??? amLODIPine (NORVASC) 5 MG tablet Take 1 tablet (5 mg total) by mouth daily. 90 tablet 3   ??? aspirin (ECOTRIN) 81 MG tablet Take 81 mg by mouth daily.     ??? atorvastatin (LIPITOR) 40 MG tablet Take 40 mg by mouth daily.     ??? benzonatate (TESSALON) 200 MG capsule Take 200 mg by mouth Three (3) times a day as needed for cough.     ??? calcipotriene (DOVONOX) 0.005 % ointment Apply every other day to areas of rash in folds as maintenance 60 g 2   ??? clobetasoL (TEMOVATE) 0.05 % ointment Apply BID to all affected area 60 g 5   ??? clonazePAM (KLONOPIN) 0.5 MG tablet Take 0.5 tablets (0.25 mg total) by mouth nightly. 15 tablet 2   ??? cyclobenzaprine (FLEXERIL) 5 MG tablet  Take 1 tablet (5 mg total) by mouth two (2) times a day as needed. 60 tablet 2   ??? diclofenac sodium (VOLTAREN) 1 % gel Apply 4 g topically Four (4) times a day. 300 g 5   ??? dicyclomine (BENTYL) 20 mg tablet Take 20 mg by mouth every six (6) hours.     ??? DULoxetine (CYMBALTA) 60 MG capsule Take 2 capsules (120 mg total) by mouth daily. 180 capsule 2   ??? fluticasone propionate (FLONASE) 50 mcg/actuation nasal spray      ??? gabapentin (NEURONTIN) 300 MG capsule Take 1 capsule (300 mg total) by mouth Three (3) times a day. 90 capsule 2   ??? hydroxychloroquine (PLAQUENIL) 200 mg tablet Take 1 tablet (200 mg total) by mouth Two (2) times a day. 180 tablet 3   ??? ketoconazole (NIZORAL) 2 % cream APP EXT AA D  0   ??? lamoTRIgine (LAMICTAL) 200 MG tablet Take 1 tablet (200 mg total) by mouth Two (2) times a day. 180 tablet 2   ??? meclizine (ANTIVERT) 25 mg tablet Take 25 mg by mouth Three (3) times a day as needed.      ??? mupirocin (BACTROBAN) 2 % ointment APP EXT IEN BID  0   ??? mycophenolate (MYFORTIC) 360 MG TbEC TAKE 1 TABLET BY MOUTH TWICE DAILY 180 each 3   ??? omeprazole (PRILOSEC) 40 MG capsule Take 40 mg by mouth daily.      ??? polyethylene glycol (GLYCOLAX) 17 gram/dose powder 2 cap fulls in a full glass of water, three times a day, for 5 days.     ??? telmisartan (MICARDIS) 40 MG tablet      ??? traZODone (DESYREL) 100 MG tablet Take 2 tablets (200 mg total) by mouth nightly. 180 tablet 2   ??? triamcinolone (KENALOG) 0.1 % ointment Apply to rash when itchy as needed, few times per week 454 g 0     No current facility-administered medications for this visit.      IMAGING:    EXAM: Magnetic resonance imaging, spinal canal and contents, lumbar, without contrast material.  DATE: 03/28/2016 1:36 PM  ACCESSION: [id number]  DICTATED: 03/28/2016 1:48 PM  INTERPRETATION LOCATION: [facility]    CLINICAL INDICATION: 63 years old Female with LUMBAR RADICULOPATHY-M54.16-Lumbar radiculopathy ??    COMPARISON: Lumbar spine radiographs 10/14/2015.    TECHNIQUE: Multiplanar MRI was performed through the lumbar spine without intravenous contrast.    FINDINGS: For the purposes of this dictation, the lowest well formed intervertebral disc space is assumed to be the L5-S1 level, and there are presumed to be five lumbar-type vertebral bodies. L5 is identified by the presence of the iliolumbar ligament. There is slight S-shaped curvature of the lumbar spine.    There is no evidence of significant malalignment. There are minor endplate degenerative changes in the visualized lower thoracic spine as well as upper L3 endplate. No suspicious bone marrow signal abnormalities. There is mild loss of intervertebral disc height and signal at L5-S1 and to a lesser extent L4-L5 secondary to degeneration. The vertebral body heights are preserved.    Normal termination of the conus medullaris at the upper L2 level. A small Schmorl node is present at the lower T11 endplate.    From T12 to L2, the spinal canal and neural foramina are patent.    At L2-L3, there is mild spinal canal narrowing due to combination of a disc bulge, thickened ligamenta flava, and mild facet degenerative changes bilaterally. There is a  small right foraminal annular fissure.    At L3-L4, there is a mild disc bulge which together with thickening of the ligamenta flava and mild facet degenerative changes results in mild spinal canal narrowing. There is mild narrowing of the neural foramina due to facet degenerative change.    At L4-L5, there is a mild bulging disc with foraminal extension bilaterally which results in mild neural foraminal narrowing compounded by facet degenerative changes. This, in combination with mild facet degenerative changes and thickening of the ligamentum flavum results in mild-to-moderate spinal canal narrowing.    At L5-S1, there is a mild bulging disc as well as mild to moderate right and mild left neural foraminal narrowing due to facet degenerative changes. No significant spinal canal narrowing. Mild thickening of the ligamenta flava.    No significant atrophy of the paraspinal musculature.    ??      Impression     Mild to moderate multilevel degenerative changes without high-grade spinal canal or neural foraminal stenosis.        Objective:     Physical Exam: Deferred, patient visit completed virtually

## 2019-02-18 ENCOUNTER — Encounter: Admit: 2019-02-18 | Discharge: 2019-02-19 | Payer: MEDICARE | Attending: Psychologist | Primary: Psychologist

## 2019-02-18 ENCOUNTER — Encounter: Admit: 2019-02-18 | Discharge: 2019-02-19 | Payer: MEDICARE

## 2019-02-18 DIAGNOSIS — M542 Cervicalgia: Secondary | ICD-10-CM | POA: Diagnosis not present

## 2019-02-18 DIAGNOSIS — G894 Chronic pain syndrome: Secondary | ICD-10-CM | POA: Diagnosis not present

## 2019-02-18 DIAGNOSIS — M150 Primary generalized (osteo)arthritis: Secondary | ICD-10-CM | POA: Diagnosis not present

## 2019-02-18 DIAGNOSIS — M545 Low back pain: Secondary | ICD-10-CM | POA: Diagnosis not present

## 2019-02-18 DIAGNOSIS — M797 Fibromyalgia: Secondary | ICD-10-CM | POA: Diagnosis not present

## 2019-02-18 DIAGNOSIS — F410 Panic disorder [episodic paroxysmal anxiety] without agoraphobia: Secondary | ICD-10-CM

## 2019-02-18 DIAGNOSIS — Z7952 Long term (current) use of systemic steroids: Secondary | ICD-10-CM

## 2019-02-18 DIAGNOSIS — G8929 Other chronic pain: Secondary | ICD-10-CM

## 2019-02-18 DIAGNOSIS — F411 Generalized anxiety disorder: Secondary | ICD-10-CM

## 2019-02-18 DIAGNOSIS — F331 Major depressive disorder, recurrent, moderate: Secondary | ICD-10-CM

## 2019-02-18 DIAGNOSIS — M48061 Spinal stenosis, lumbar region without neurogenic claudication: Secondary | ICD-10-CM

## 2019-02-18 DIAGNOSIS — F4310 Post-traumatic stress disorder, unspecified: Principal | ICD-10-CM

## 2019-02-18 MED ORDER — CYCLOBENZAPRINE 5 MG TABLET
ORAL_TABLET | Freq: Two times a day (BID) | ORAL | 2 refills | 0 days | Status: CP | PRN
Start: 2019-02-18 — End: ?

## 2019-02-18 MED ORDER — GABAPENTIN 300 MG CAPSULE
ORAL_CAPSULE | Freq: Three times a day (TID) | ORAL | 2 refills | 0.00000 days | Status: CP
Start: 2019-02-18 — End: ?

## 2019-02-18 MED ORDER — DICLOFENAC 1 % TOPICAL GEL
Freq: Four times a day (QID) | TOPICAL | 5 refills | 0 days | Status: CP
Start: 2019-02-18 — End: ?

## 2019-02-18 NOTE — Unmapped (Signed)
Continue gabapentin, voltaren gel and flexeril as prescribed  Plan for lumbar facet injections in the future  Follow up in 3 months

## 2019-02-18 NOTE — Unmapped (Signed)
[facility]   Confidential Psychological Therapy Session      Patient Name: Caitlin Smith  Medical Record Number: [id number]  Date of Service: February 18, 2019  Attending Psychologist: Caroline More, PhD  CPT Procedure Codes: 29528 for 60 mins of face to face counseling  Time: 10:00-11:00am    Due to the current declared state emergency during the coronavirus pandemic, all non-urgent medical and mental health visits have been triaged to either be delayed or take place virtually via phone or videoconferencing.   Due to the need for continued patient interaction for mental health care, pain management, and care coordination that necessitates the involvement of this provider, this visit was performed face to face using interactive technology using a HIPPA compliant audio/visual platform. This patient will be scheduled for face to face visits in the future once it has been deemed safe.      We reviewed confidentiality today. The patient was present at home (location and contact information confirmed), attended this visit alone, and consented to this virtual pain psychology visit.     Visit modifiers:   GT for Interactive Technology and CR for catastrophe/disaster related due to coronavirus pandemic    REFERRING PHYSICIAN: Clarene Essex, MD    CHIEF COMPLAINT AND REASON FOR VISIT: pain coping skills, CBT to address depression and anxiety in the setting of chronic pain    SUBJECTIVE / HISTORY OF PRESENT ILLNESS: Caitlin Smith is a very pleasant 63 y.o.  female from [place], [place] with multiple chronic pain complaints related to fibromyalgia and lupus who initially met with me in October 2016, and which time she was diagnosed with severe depression, PTSD, panic disorder, and generalized anxiety.The patient returns for a therapy session today. Last follow up with me was 10/08/2018.     Patient met with Dr. Fayrene Fearing earlier today.  Received medication refills.  Notes her pain has been worse the past month.  Currently 7/10.  Discussed scheduling injections with Dr. Fayrene Fearing when she is able.  Mostly has been staying home, but tries to get outside once a day to walk.  States I try to push forward to get some activity noting that it helps with mood and anxiety.  Discussed activity rest cycling and pacing today and encouraged to continue activity to help boost mood.  Patient stays connected with her church and Bible study.  Churches online on Sundays and Bible study online on Wednesdays.  Noted that the structure helps her get through the week.  She has not spent time with her grandkids and this is a significant change, 1 that impacts mood.  She expressed thoughts and feelings related to missing her grandchildren but also worry about exposure risk given that she is high risk overall.  Patient noted pain has negatively impacted sleep.  Discussed using insight timer, free behavioral relaxation app, before bed.  Noted that the activation during the day, structure of social activities online, and behavioral relaxation at night provide a good balance for self-care.  Patient agrees to practice these skills.    Spent additional time today utilizing and teaching problem solving.  Patient has largely stayed home, avoiding any in person social interaction because of coronavirus risk.  However she had a stress test last week.  Missed her mammogram in April.  Has a strong family history of breast cancer, noting it killed every auntie and my mother.  Patient has her repeat mammogram yearly, every April.  Has not heard back about rescheduling.  Ultimately decides today to call, and schedule that, weighing pros and cons of delaying versus scheduling, and ultimately she feels like the risk of not having it done promptly is greater risk overall to her.        OBJECTIVE / MENTAL STATUS:    Appearance:   Appears stated age and Clean/Neat   Motor:  No abnormal movements   Speech/Language:   Normal rate, volume, tone, fluency Mood:  Depressed and Anxious   Affect:  Bright, full   Thought process:  Logical, linear, clear, coherent, goal directed   Thought content:    Denies SI, HI, self harm, delusions, obsessions, paranoid ideation, or ideas of reference   Perceptual disturbances:    Denies auditory and visual hallucinations, behavior not concerning for response to internal stimuli   Orientation:  Oriented to person, place, time, and general circumstances   Attention:  Able to fully attend without fluctuations in consciousness   Concentration:  Able to fully concentrate and attend   Memory:  Immediate, short-term, long-term, and recall grossly intact    Fund of knowledge:   Consistent with level of education and development   Insight:    Fair   Judgment:   Intact   Impulse Control:  Intact     DIAGNOSTIC IMPRESSION:   Post Traumatic Stress Disorder (PTSD)  Generalized Anxiety Disorder (GAD)  Panic Disorder  Major Depressive Disorder, moderate, recurrent  Chronic pain syndrome  Fibromyalgia  Lupus    ASSESSMENT:   Caitlin Smith is a very pleasant 63 y.o.  female from [place], [place] with multiple chronic pain complaints related to lupus, arthritis, and fibromyalgia. She was previously seen in our clinic from 2012-2014, and reestablished care with Dr. Fayrene Fearing in August 2016. The patient is struggling with depression and anxiety, but is very motivated to participate in intensive, multidisciplinary treatment to address the connection between depression, anxiety and pain. She has a long-standing history of depression and anxiety, and has worked with outpatient psychiatrist and therapist over the years. She is currently established with [facility] psychiatry, and is tapering off of Klonopin.  Depression, anxiety, and panic have all improved.  Has been diagnosed with diabetes, and is managing it well with p.o. medication and dietary changes.  Has lost 50+ pounds.  Has increased energy, improved sleep, decreased caffeine intake.  Is exercising/walking on a treadmill regularly, 3-4 times per week.  Discussed pacing and activity rest cycling, adjusting to an exercise bike to offload force on her knees which is currently a problem.      PLAN:   (1) Psychotherapy - Continue CBT. CBT will be used to address PTSD, MDD, Panic D/o, GAD, and chronic pain.     --Behavioral Activation - Encouraged behavioral activation.  Is doing great, back to going to the gym every day, walking.  --Downloaded and uses Insight Timer, guided relaxation app, for nightly practice.    (2) Psychiatry - Pt currently established with [facility].      (3) Nutritionist - saw a nutritionist and found it helpful. Has lost weight, related to nausea. Exercising more too. Lost 50+ lbs in last year (off steriods)    (4) Safety - Pt denies any current or recent SI or safety concerns. Knows to call 911 or go to her local ED. Also given [facility]      (5) follow-up - with me 8/10 @ 10am

## 2019-02-19 ENCOUNTER — Encounter: Admit: 2019-02-19 | Discharge: 2019-02-20 | Payer: MEDICARE

## 2019-02-19 DIAGNOSIS — R0602 Shortness of breath: Principal | ICD-10-CM

## 2019-02-19 IMAGING — CT CT ABD-PELV W/ CM
2 of 6 series · 16 of 46 positions shown, 18 images · IV contrast (APPLIED)
Comparison: 07/03/2013

CLINICAL DATA: Right-sided abdominal pain with nausea x4 days.

EXAM:
CT ABDOMEN AND PELVIS WITH CONTRAST
TECHNIQUE: Multidetector CT imaging of the abdomen and pelvis was performed
using the standard protocol following bolus administration of
intravenous contrast.
CONTRAST:  100mL IO8UDC-ADD IOPAMIDOL (IO8UDC-ADD) INJECTION 61%

[Series 2: axial st · axial · 0.74mm/px · z∈[-289,+136]mm · 13 of 99 slices shown, 15 images]
[im 7/99  soft-tissue]
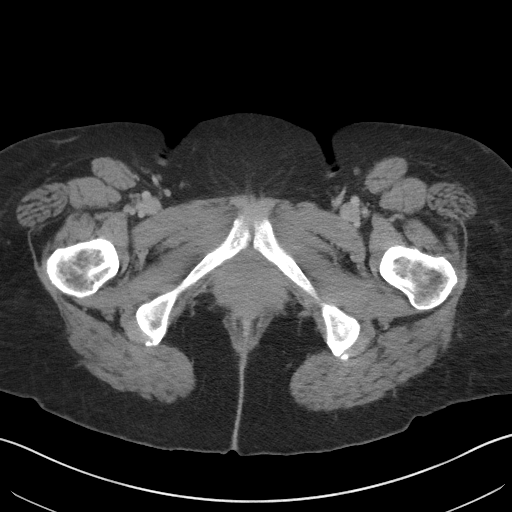
[im 7/99  bone]
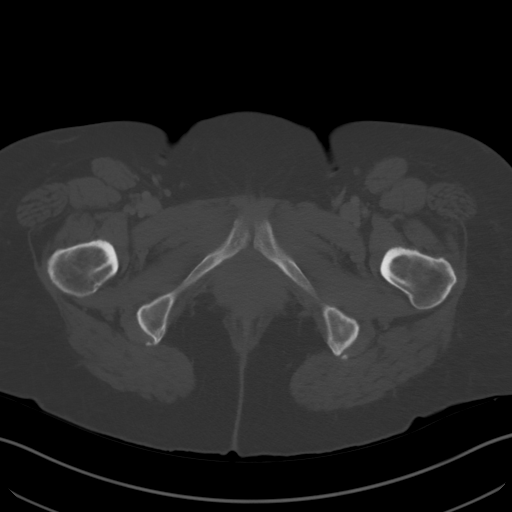
[im 14/99  soft-tissue]
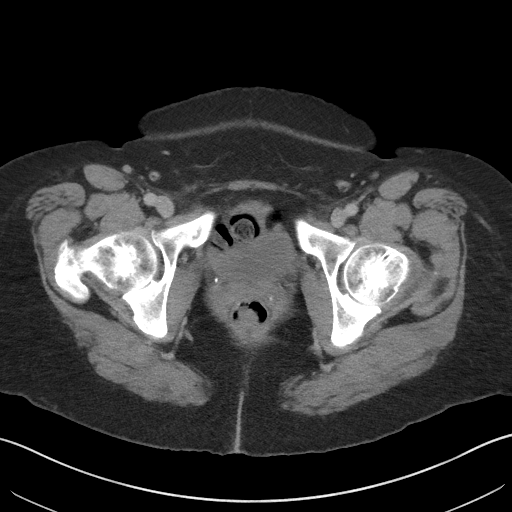
[im 20/99  soft-tissue]
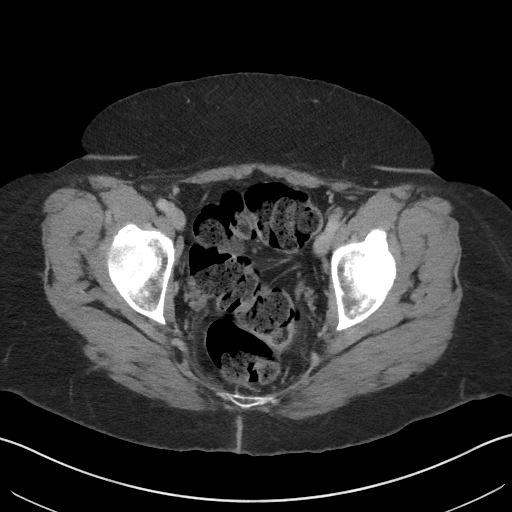
[im 27/99  soft-tissue]
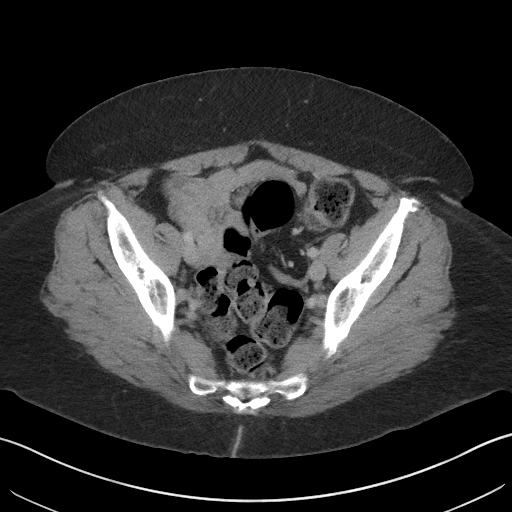
[im 33/99  soft-tissue]
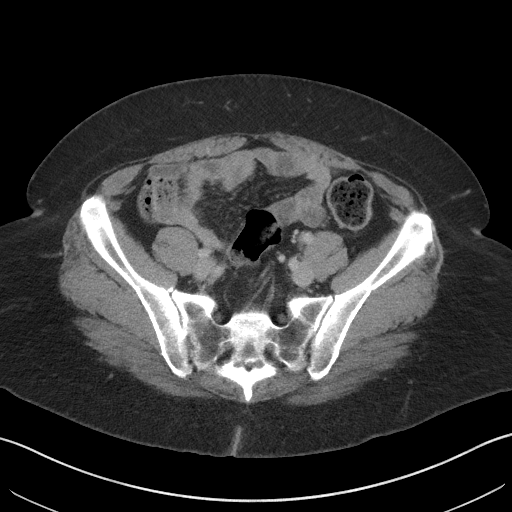
[im 40/99  soft-tissue]
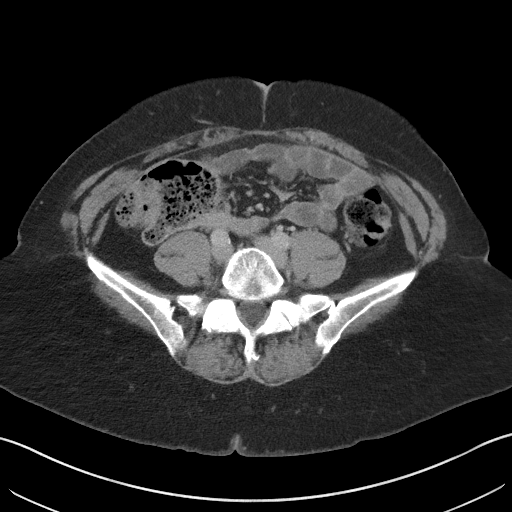
[im 53/99  soft-tissue]
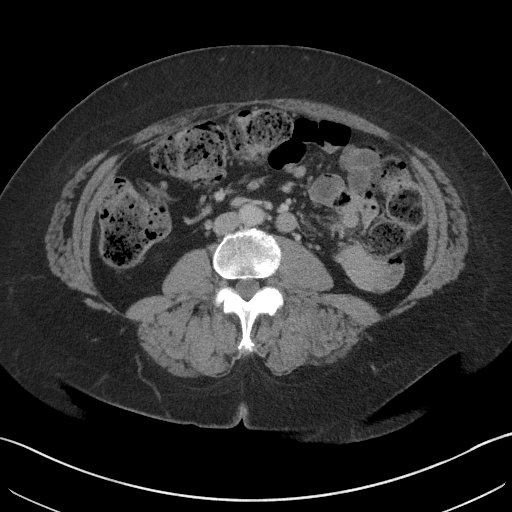
[im 59/99  soft-tissue]
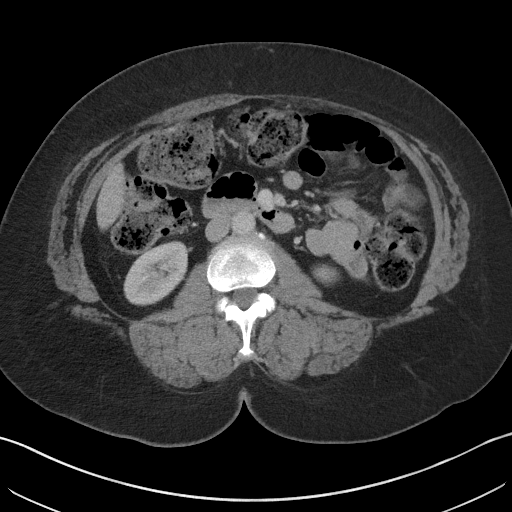
[im 66/99  soft-tissue]
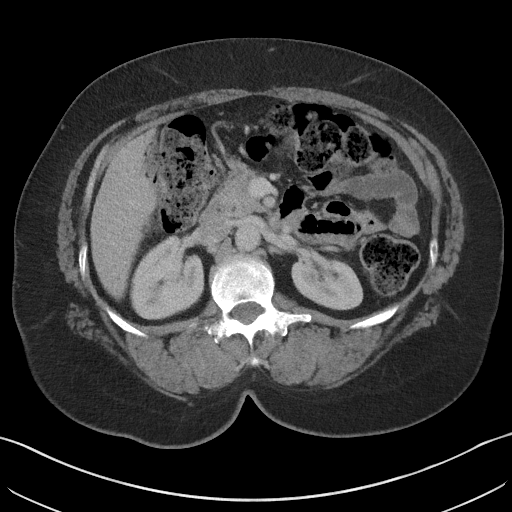
[im 66/99  bone]
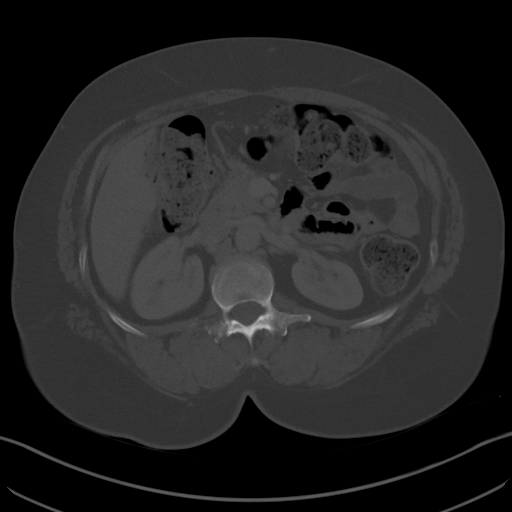
[im 72/99  soft-tissue]
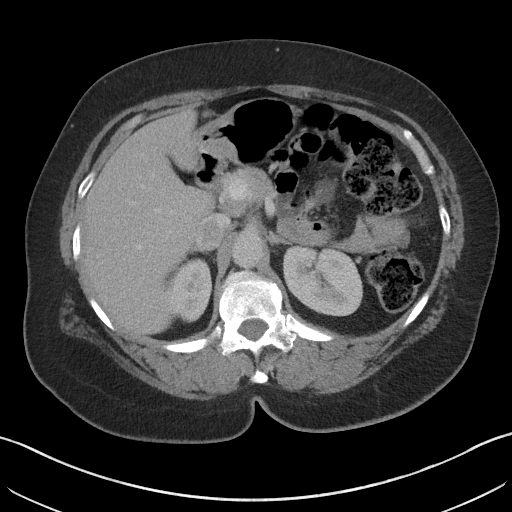
[im 79/99  soft-tissue]
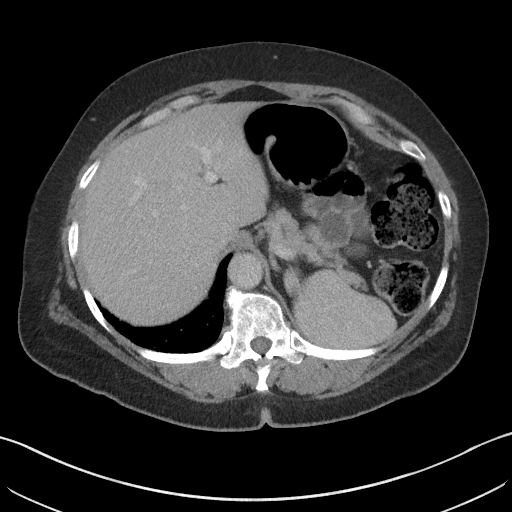
[im 85/99  soft-tissue]
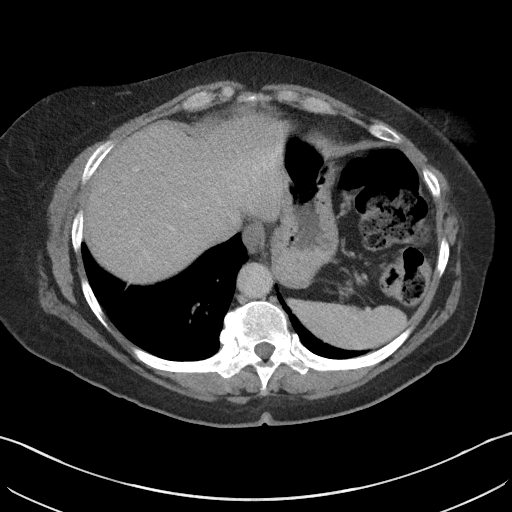
[im 92/99  soft-tissue]
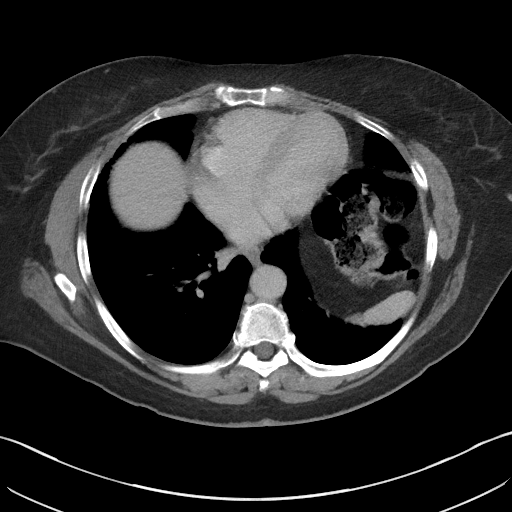

[Series 5: coronal st · coronal · 0.68mm/px · 3 of 81 slices shown]
[im 27/81  soft-tissue]
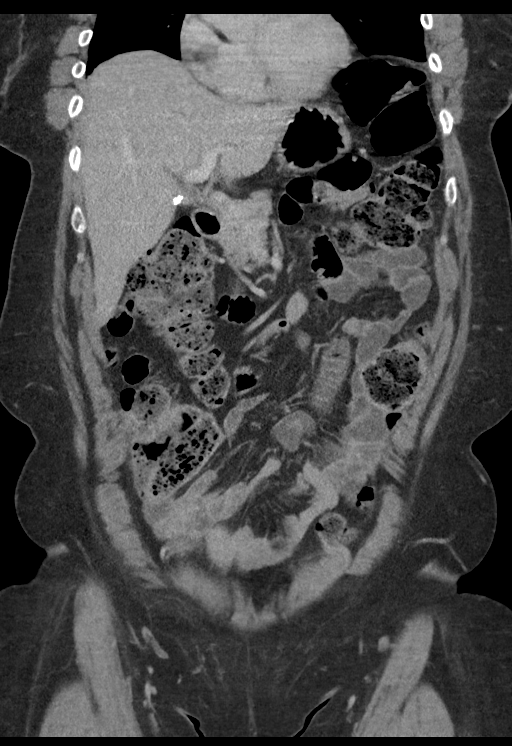
[im 36/81  soft-tissue]
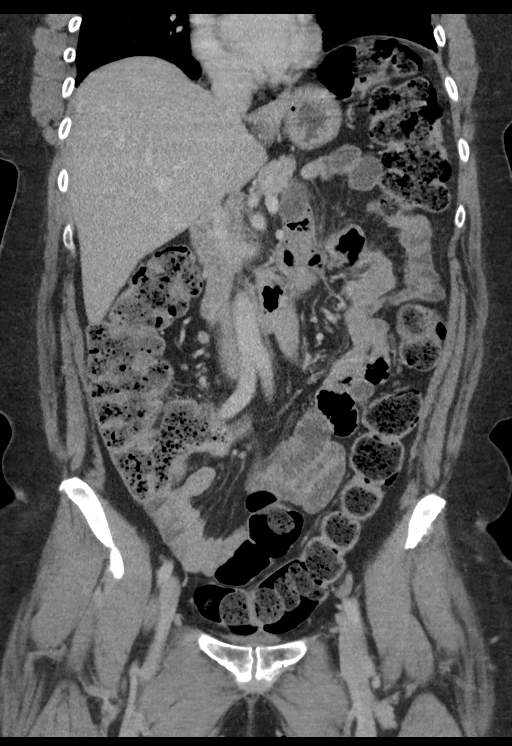
[im 45/81  soft-tissue]
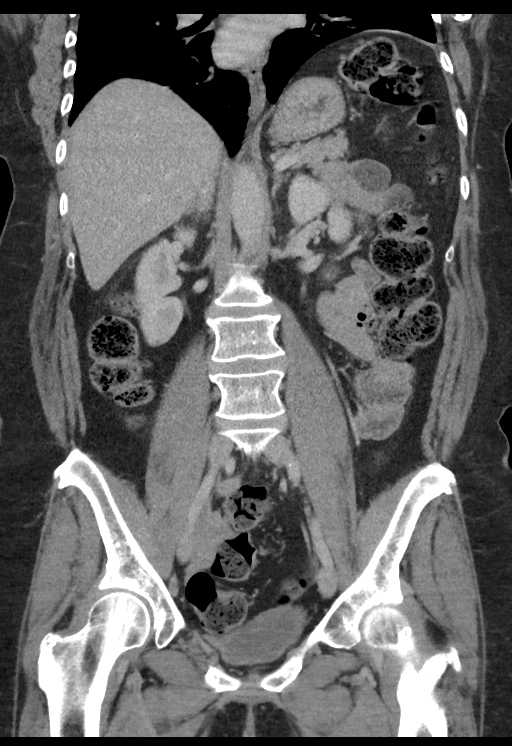

[16 of 46 positions shown; findings below may reference images not displayed]

FINDINGS: Lower chest: The included heart size is normal without pericardial
effusion. No active pulmonary disease. Elevated left hemidiaphragm
as before.

Hepatobiliary: Cholecystectomy. Homogeneous attenuation of the liver
without focal enhancing mass lesion or biliary dilatation.

Pancreas: No pancreatic mass, ductal dilatation or inflammation.

Spleen: Normal

Adrenals/Urinary Tract: Normal bilateral adrenal glands. Symmetric
cortical enhancement of both kidneys. Symmetric pyelograms on repeat
delayed imaging through the kidneys. No hydroureteronephrosis.
Unremarkable urinary bladder for the degree of distention.

Stomach/Bowel: The stomach and small intestine are unremarkable.
Significant stool retention throughout the colon consistent with
constipation. Appendix is normal.

Vascular/Lymphatic: No significant vascular findings are present. No
enlarged abdominal or pelvic lymph nodes.

Reproductive: Status post hysterectomy. No adnexal masses.

Other: No abdominal wall hernia or abnormality. No abdominopelvic
ascites.

Musculoskeletal: No acute or significant osseous findings.
IMPRESSION: Significant stool burden throughout the colon consistent with
constipation. No mechanical bowel obstruction or inflammation.
Normal appendix.

## 2019-02-23 NOTE — Unmapped (Signed)
This was a telehealth service where a resident was involved. I saw and evaluated the patient via real-time audio/video connection, participating in the key portions of the service.  I reviewed the resident's note.  I agree with the resident's findings and plan

## 2019-02-25 ENCOUNTER — Other Ambulatory Visit: Payer: Self-pay | Admitting: Family Medicine

## 2019-02-25 DIAGNOSIS — I10 Essential (primary) hypertension: Secondary | ICD-10-CM

## 2019-02-28 ENCOUNTER — Encounter: Admit: 2019-02-28 | Discharge: 2019-03-01 | Payer: MEDICARE

## 2019-02-28 DIAGNOSIS — J8410 Pulmonary fibrosis, unspecified: Secondary | ICD-10-CM | POA: Diagnosis not present

## 2019-03-06 ENCOUNTER — Other Ambulatory Visit: Payer: Self-pay

## 2019-03-06 ENCOUNTER — Encounter: Payer: Self-pay | Admitting: Family Medicine

## 2019-03-06 ENCOUNTER — Ambulatory Visit: Payer: Self-pay | Admitting: Family Medicine

## 2019-03-06 ENCOUNTER — Ambulatory Visit (INDEPENDENT_AMBULATORY_CARE_PROVIDER_SITE_OTHER): Payer: Medicare Other | Admitting: Family Medicine

## 2019-03-06 DIAGNOSIS — L299 Pruritus, unspecified: Secondary | ICD-10-CM

## 2019-03-06 MED ORDER — HYDROXYZINE HCL 10 MG PO TABS: 10.0000 mg | ORAL_TABLET | Freq: Four times a day (QID) | ORAL | 0 refills | Status: DC | PRN

## 2019-03-06 NOTE — Progress Notes (Signed)
Name: Nicole Ballard   MRN: [id number]    DOB: 06-29-1956   Date:03/06/2019       Progress Note  Subjective  Chief Complaint  Chief Complaint  Patient presents with  . Pruritis    Onset-2 days, Tried Benadryl with no relief. Arms and states went she scratches cause whelps     I connected with  [name]  on 03/06/19 at 11:00 AM EDT by a video enabled telemedicine application and verified that I am speaking with the correct person using two identifiers.  I discussed the limitations of evaluation and management by telemedicine and the availability of in person appointments. The patient expressed understanding and agreed to proceed. Staff also discussed with the patient that there may be a patient responsible charge related to this service. Patient Location: at home  Provider Location: [facility]   HPI  Generalized pruritus: she denies increase in anxiety, she denies changing hygiene products or dietary modification. She denies increase in nausea ( she has chronic nausea) no chest pain or SOB, no recent wheezing. She is taking Benadryl with mild improvement of symptoms. Advised to moisturize skin and we will try hydroxyzine, explained that it may cause sedation and she can take up to 4 times a day, she will contact me back if no resolution of symptoms    Patient Active Problem List   Diagnosis Date Noted  . Hyperglycemia 10/23/2017  . Vertigo 09/29/2016  . Bronchiolitis obliterans organizing pneumonia ([place]) 09/29/2016  . Neck pain, musculoskeletal 12/15/2015  . Anxiety about health 09/29/2015  . Abnormal CAT scan 04/11/2015  . Auditory impairment 04/11/2015  . Insomnia, persistent 04/11/2015  . Chronic kidney disease (CKD), stage I 04/11/2015  . Chronic nonmalignant pain 04/11/2015  . Chronic constipation 04/11/2015  . Dyslipidemia 04/11/2015  . Fibromyalgia 04/11/2015  . Gastro-esophageal reflux disease without esophagitis 04/11/2015  . Dysphonia  04/11/2015  . Absence of bladder continence 04/11/2015  . Low back pain 04/11/2015  . Eczema intertrigo 04/11/2015  . Keratosis pilaris 04/11/2015  . Chronic recurrent major depressive disorder ([place]) 04/11/2015  . Dysmetabolic syndrome 23/76/2831  . Neurogenic claudication 04/11/2015  . Perennial allergic rhinitis with seasonal variation 04/11/2015  . Abnormal presence of protein in urine 04/11/2015  . Seborrhea capitis 04/11/2015  . Moderate dysplasia of vulva 04/11/2015  . Vitamin D deficiency 04/11/2015  . Treadmill stress test negative for angina pectoris 03/05/2015  . Shortness of breath 05/20/2014  . Asthma, chronic 12/31/2013  . H/O total knee replacement 12/31/2013  . Episcleritis 09/26/2013  . Vocal cord dysfunction 05/28/2013  . Anxiety, generalized 03/19/2013  . Back pain 12/18/2012  . Pulmonary nodules 11/26/2012  . Lung nodule, multiple 11/26/2012  . Arthritis, degenerative 11/01/2012  . H/O aspiration pneumonitis 10/22/2012  . Postinflammatory pulmonary fibrosis ([place]) 10/22/2012  . Mixed incontinence 05/04/2012  . Benign neoplasm of pituitary gland and craniopharyngeal duct ([place]) 12/29/2010  . Benign neoplasm of pituitary gland ([place]) 12/29/2010  . Lung involvement in systemic lupus erythematosus ([place]) 11/05/2010  . Chronic nausea 07/01/2008  . Benign hypertension 01/25/2005    Past Surgical History:  Procedure Laterality Date  . BRAIN SURGERY    . CHOLECYSTECTOMY    . VAGINAL HYSTERECTOMY      Family History  Problem Relation Age of Onset  . Breast cancer Mother   . Cancer Mother 16       Breast  . Cancer Father 71       Stomach  . Breast cancer  Maternal Aunt     Social History   Socioeconomic History  . Marital status: Divorced    Spouse name: Not on file  . Number of children: 2  . Years of education: Not on file  . Highest education level: GED or equivalent  Occupational History  . Occupation: Disability  Social Needs  . Financial  resource strain: Somewhat hard  . Food insecurity    Worry: Sometimes true    Inability: Sometimes true  . Transportation needs    Medical: No    Non-medical: No  Tobacco Use  . Smoking status: Never Smoker  . Smokeless tobacco: Never Used  Substance and Sexual Activity  . Alcohol use: No    Alcohol/week: 0.0 standard drinks  . Drug use: No  . Sexual activity: Not Currently    Partners: Male  Lifestyle  . Physical activity    Days per week: 4 days    Minutes per session: 40 min  . Stress: Not at all  Relationships  . Social Herbalist on phone: Three times a week    Gets together: Never    Attends religious service: More than 4 times per year    Active member of club or organization: Yes    Attends meetings of clubs or organizations: More than 4 times per year    Relationship status: Divorced  . Intimate partner violence    Fear of current or ex partner: No    Emotionally abused: No    Physically abused: No    Forced sexual activity: No  Other Topics Concern  . Not on file  Social History Narrative  . Not on file     Current Outpatient Medications:  .  aspirin EC 81 MG tablet, Take 1 tablet (81 mg total) by mouth daily., Disp: 30 tablet, Rfl: 0 .  atorvastatin (LIPITOR) 40 MG tablet, TAKE 1 TABLET BY MOUTH  DAILY, Disp: 90 tablet, Rfl: 1 .  Biotin 1 MG CAPS, Take 1 capsule by mouth daily., Disp: 30 capsule, Rfl: 0 .  buPROPion (WELLBUTRIN XL) 150 MG 24 hr tablet, Take 1 tablet (150 mg total) by mouth daily., Disp: 30 tablet, Rfl: 0 .  Cholecalciferol (VITAMIN D) 2000 UNITS CAPS, Take 1 capsule by mouth daily., Disp: , Rfl:  .  clobetasol ointment (TEMOVATE) 0.05 %, Apply topically., Disp: , Rfl:  .  clonazePAM (KLONOPIN) 0.5 MG tablet, Take 0.5 tablets (0.25 mg total) by mouth daily., Disp: 15 tablet, Rfl: 0 .  cyclobenzaprine (FLEXERIL) 5 MG tablet, TAKE 1 TABLET BY MOUTH  EVERY 8 HOURS AS NEEDED FOR MUSCLE SPASMS. DO NOT DRIVE FOR 8 HOURS, Disp: 90 tablet,  Rfl: 1 .  diclofenac sodium (VOLTAREN) 1 % GEL, APP 2 GRAMS EXT AA QID, Disp: , Rfl: 6 .  DULoxetine (CYMBALTA) 60 MG capsule, Take 120 mg by mouth at bedtime. , Disp: , Rfl:  .  fluticasone (FLONASE) 50 MCG/ACT nasal spray, Place 2 sprays into both nostrils daily., Disp: 48 g, Rfl: 3 .  gabapentin (NEURONTIN) 300 MG capsule, Take 1 capsule by mouth at bedtime., Disp: , Rfl:  .  hydroxychloroquine (PLAQUENIL) 200 MG tablet, Take 200 mg by mouth 2 (two) times daily., Disp: , Rfl:  .  ketoconazole (NIZORAL) 2 % cream, Apply 1 application topically daily., Disp: 15 g, Rfl: 0 .  lamoTRIgine (LAMICTAL) 200 MG tablet, Take 1 tablet (200 mg total) by mouth 2 (two) times daily., Disp: 60 tablet, Rfl: 0 .  meclizine (ANTIVERT) 25 MG tablet, Take 1 tablet (25 mg total) by mouth 3 (three) times daily as needed., Disp: 30 tablet, Rfl: 0 .  mycophenolate (MYFORTIC) 360 MG TBEC EC tablet, Take 360 mg by mouth 2 (two) times daily., Disp: , Rfl:  .  olopatadine (PATANOL) 0.1 % ophthalmic solution, Place 1 drop into both eyes 2 (two) times daily., Disp: 5 mL, Rfl: 3 .  omeprazole (PRILOSEC) 40 MG capsule, TAKE 1 CAPSULE BY MOUTH  DAILY, Disp: 90 capsule, Rfl: 1 .  ondansetron (ZOFRAN) 4 MG tablet, TAKE 1 TABLET BY MOUTH  EVERY 8 HOURS AS NEEDED FOR NAUSEA AND VOMITING, Disp: 60 tablet, Rfl: 0 .  Polyethylene Glycol 3350 (MIRALAX PO), Take by mouth., Disp: , Rfl:  .  telmisartan (MICARDIS) 40 MG tablet, Take 1 tablet (40 mg total) by mouth daily., Disp: 90 tablet, Rfl: 1 .  traZODone (DESYREL) 100 MG tablet, Take 200 tablets by mouth at bedtime. , Disp: , Rfl:  .  triamcinolone ointment (KENALOG) 0.1 %, , Disp: , Rfl:  .  hydrOXYzine (ATARAX/VISTARIL) 10 MG tablet, Take 1 tablet (10 mg total) by mouth every 6 (six) hours as needed., Disp: 60 tablet, Rfl: 0  Allergies  Allergen Reactions  . Ace Inhibitors     Other reaction(s): OTHER Pt states she can not take ace inhibitors. Pt states she can not remember her  reaction.   . Bee Pollen   . Penicillins   . Pollen Extract     I personally reviewed active problem list, medication list, allergies, family history, social history with the patient/caregiver today.   ROS  Ten systems reviewed and is negative except as mentioned in HPI   Objective  Virtual encounter, vitals not obtained.  There is no height or weight on file to calculate BMI.  Physical Exam  Awake, alert and oriented , unable to see whelps   Fall Risk: Fall Risk  02/07/2019 12/11/2018 11/20/2018 09/28/2018 09/11/2018  Falls in the past year? 1 1 1 1 1   Number falls in past yr: 1 1 1 1 1   Comment - - - - -  Injury with Fall? 0 0 0 0 0  Risk for fall due to : - - - Impaired balance/gait -  Risk for fall due to: Comment - - - Vertigo -  Follow up - - Falls evaluation completed - -     Assessment & Plan  1. Generalized pruritus  - hydrOXYzine (ATARAX/VISTARIL) 10 MG tablet; Take 1 tablet (10 mg total) by mouth every 6 (six) hours as needed.  Dispense: 60 tablet; Refill: 0  I discussed the assessment and treatment plan with the patient. The patient was provided an opportunity to ask questions and all were answered. The patient agreed with the plan and demonstrated an understanding of the instructions.  The patient was advised to call back or seek an in-person evaluation if the symptoms worsen or if the condition fails to improve as anticipated.  I provided 15  minutes of non-face-to-face time during this encounter.

## 2019-03-06 NOTE — Telephone Encounter (Signed)
   Reason for Disposition . Caller has NON-URGENT medication question about med that PCP prescribed and triager unable to answer question    Her question regarding her BP medication will be addressed during her virtual visit at 11:00 this morning with Dr. Ancil Boozer.  Answer Assessment - Initial Assessment Questions 1. SYMPTOMS: "Do you have any symptoms?"     Pt called in with a question regarding her BP medication (did not mention what question was).   In her chart the Telmisartan was refused for a refill. 2. SEVERITY: If symptoms are present, ask "Are they mild, moderate or severe?"     N/A  I called [facility] and she has an appt today virtually with Dr. Ancil Boozer at 11:00.  (It's 10:30 now).    They made a note to check with pt during her virtual visit today what her question was regarding her BP medication.  Pt not called by this [place] nurse.  Protocols used: MEDICATION QUESTION CALL-A-AH

## 2019-03-07 ENCOUNTER — Other Ambulatory Visit: Payer: Self-pay | Admitting: Nurse Practitioner

## 2019-03-07 ENCOUNTER — Telehealth: Payer: Self-pay | Admitting: Family Medicine

## 2019-03-07 DIAGNOSIS — E1169 Type 2 diabetes mellitus with other specified complication: Secondary | ICD-10-CM

## 2019-03-07 NOTE — Chronic Care Management (AMB) (Signed)
Chronic Care Management   Note  03/07/2019 Name: MARLENA BARBATO MRN: [id number] DOB: 1956-01-26  LAYLANA GERWIG is a 63 y.o. year old female who is a primary care patient of Steele Sizer, MD. I reached out to [name] by phone today in response to a referral sent by Ms. Jetty Duhamel Symonds's health plan.    Ms. Bleiler was given information about Chronic Care Management services today including:  1. CCM service includes personalized support from designated clinical staff supervised by her physician, including individualized plan of care and coordination with other care providers 2. 24/7 contact phone numbers for assistance for urgent and routine care needs. 3. Service will only be billed when office clinical staff spend 20 minutes or more in a month to coordinate care. 4. Only one practitioner may furnish and bill the service in a calendar month. 5. The patient may stop CCM services at any time (effective at the end of the month) by phone call to the office staff. 6. The patient will be responsible for cost sharing (co-pay) of up to 20% of the service fee (after annual deductible is met).  Patient agreed to services and verbal consent obtained.   Follow up plan: Telephone appointment with CCM team member scheduled for: 03/18/2019  [place]  ??[email or web address]   ??[phone]

## 2019-03-18 ENCOUNTER — Other Ambulatory Visit: Payer: Self-pay

## 2019-03-18 ENCOUNTER — Ambulatory Visit: Payer: Medicare Other

## 2019-03-18 DIAGNOSIS — I10 Essential (primary) hypertension: Secondary | ICD-10-CM

## 2019-03-18 DIAGNOSIS — E1169 Type 2 diabetes mellitus with other specified complication: Secondary | ICD-10-CM

## 2019-03-18 NOTE — Patient Instructions (Signed)
  Thank you allowing the Chronic Care Management Team to be a part of your care! It was a pleasure speaking with you today!  1. Please read all medication labels carefully and take your medications as prescribed 2. Please go to pharmacy and pick up your D3 and Bioten and take as prescribed 3. We will finish up your health assessment and discuss additional health goals at your next telephone appointment  CCM (Chronic Care Management) Team   Trish Fountain RN, BSN Nurse Care Coordinator  [phone]  Ruben Reason PharmD  Clinical Pharmacist  [phone]   [place], [place] Social Worker [phone]  Goals Addressed            This Visit's Progress   . I really need to make sure I am taking my medications correctly (pt-stated)       Current Barriers:  Marland Kitchen Knowledge Deficits related to understanding prescription instructions verbage  Nurse Case Manager Clinical Goal(s):  Marland Kitchen Over the next 30 days, patient will take ALL medications as prescribed including PRN medications as needed   Interventions:  . Reconciled all medications . Discussed prescription instructions and differenced between "every 6 hours and as needed" . Provided education on rational for medications and why medications such as Flonase would only work if it was taken consistently  Patient Self Care Activities:  . Patient will read ALL medication labels thoroughly and take medications exactly as written . Patient will take "as needed" medications as needed and not scheduled even if the prescriptions says "every 6 hours" as needed  Initial goal documentation        The patient verbalized understanding of instructions provided today and declined a print copy of patient instruction materials.   Telephone follow up appointment with care management team member scheduled for: 03/25/2019 at 2:00  SYMPTOMS OF A STROKE   You have any symptoms of stroke. "BE FAST" is an easy way to remember the main  warning signs: ? B - Balance. Signs are dizziness, sudden trouble walking, or loss of balance. ? E - Eyes. Signs are trouble seeing or a sudden change in how you see. ? F - Face. Signs are sudden weakness or loss of feeling of the face, or the face or eyelid drooping on one side. ? A - Arms. Signs are weakness or loss of feeling in an arm. This happens suddenly and usually on one side of the body. ? S - Speech. Signs are sudden trouble speaking, slurred speech, or trouble understanding what people say. ? T - Time. Time to call emergency services. Write down what time symptoms started.  You have other signs of stroke, such as: ? A sudden, very bad headache with no known cause. ? Feeling sick to your stomach (nausea). ? Throwing up (vomiting). ? Jerky movements you cannot control (seizure).  SYMPTOMS OF A HEART ATTACK  What are the signs or symptoms? Symptoms of this condition include:  Chest pain. It may feel like: ? Crushing or squeezing. ? Tightness, pressure, fullness, or heaviness.  Pain in the arm, neck, jaw, back, or upper body.  Shortness of breath.  Heartburn.  Indigestion.  Nausea.  Cold sweats.  Feeling tired.  Sudden lightheadedness.

## 2019-03-18 NOTE — Chronic Care Management (AMB) (Signed)
  Chronic Care Management   Initial Visit Note  03/18/2019 Name: Nicole Ballard MRN: [id number] DOB: 01-05-1956  Subjective: "I am so grateful you can help me sort out my medications"  Objective:  Assessment: Prosperity Darrough is a 63 year old female patient of Dr. Steele Sizer, who was referred to the CCM Team by her health plan. She was consented to services and CCM RN CM attempted to complete initial health assessment today. Upon medication review, it was apparent that patient needed extensive review and medications needed reconciling. Todays appointment was solely focused on patient understanding rational for prescribed medication and difference between as needed and scheduled instructions.   Review of patient status, including review of consultants reports, relevant laboratory and other test results, and collaboration with appropriate care team members and the patient's provider was performed as part of comprehensive patient evaluation and provision of chronic care management services.     Goals Addressed            This Visit's Progress   . I really need to make sure I am taking my medications correctly (pt-stated)       Current Barriers:  Marland Kitchen Knowledge Deficits related to understanding prescription instructions verbage  Nurse Case Manager Clinical Goal(s):  Marland Kitchen Over the next 30 days, patient will take ALL medications as prescribed including PRN medications as needed   Interventions:  . Reconciled all medications . Discussed prescription instructions and differenced between "every 6 hours and as needed" . Provided education on rational for medications and why medications such as Flonase would only work if it was taken consistently  Patient Self Care Activities:  . Patient will read ALL medication labels thoroughly and take medications exactly as written . Patient will take "as needed" medications as needed and not scheduled even if the prescriptions says "every 6 hours" as  needed  Initial goal documentation         Follow up plan:  Telephone follow up appointment with care management team member scheduled for: 03/25/2019 at 2:00 to complete health assessment and establish health goals   Chelsia Serres E. Rollene Rotunda, RN, BSN Nurse Care Coordinator [facility] Care Management  [phone]

## 2019-03-19 NOTE — Unmapped (Signed)
Pt called the clinic nurse line and left a message stating that she would like to discuss the most recent results of her PFT's.     She stated that she was taken off of her inhalers, and would like to know if she should remain off, or begin taking again.

## 2019-03-20 MED ORDER — TRAZODONE 100 MG TABLET
ORAL_TABLET | Freq: Every evening | ORAL | 2 refills | 90 days | Status: CP
Start: 2019-03-20 — End: 2019-06-18

## 2019-03-20 MED ORDER — DULOXETINE 60 MG CAPSULE,DELAYED RELEASE
ORAL_CAPSULE | Freq: Every day | ORAL | 2 refills | 90.00000 days | Status: CP
Start: 2019-03-20 — End: 2019-06-18

## 2019-03-20 MED ORDER — CLONAZEPAM 0.5 MG TABLET
ORAL_TABLET | Freq: Every evening | ORAL | 2 refills | 30.00000 days | Status: CP
Start: 2019-03-20 — End: ?

## 2019-03-20 MED ORDER — LAMOTRIGINE 200 MG TABLET
ORAL_TABLET | Freq: Two times a day (BID) | ORAL | 2 refills | 90 days | Status: CP
Start: 2019-03-20 — End: 2019-06-18

## 2019-03-21 ENCOUNTER — Encounter: Payer: Self-pay | Admitting: Family Medicine

## 2019-03-21 ENCOUNTER — Encounter: Admit: 2019-03-21 | Discharge: 2019-03-21 | Payer: MEDICARE

## 2019-03-21 DIAGNOSIS — N181 Chronic kidney disease, stage 1: Secondary | ICD-10-CM | POA: Diagnosis not present

## 2019-03-21 DIAGNOSIS — Z803 Family history of malignant neoplasm of breast: Secondary | ICD-10-CM | POA: Diagnosis not present

## 2019-03-21 DIAGNOSIS — H905 Unspecified sensorineural hearing loss: Secondary | ICD-10-CM | POA: Diagnosis not present

## 2019-03-21 DIAGNOSIS — E1122 Type 2 diabetes mellitus with diabetic chronic kidney disease: Secondary | ICD-10-CM | POA: Diagnosis not present

## 2019-03-21 DIAGNOSIS — I129 Hypertensive chronic kidney disease with stage 1 through stage 4 chronic kidney disease, or unspecified chronic kidney disease: Secondary | ICD-10-CM | POA: Diagnosis not present

## 2019-03-21 DIAGNOSIS — Z1231 Encounter for screening mammogram for malignant neoplasm of breast: Secondary | ICD-10-CM | POA: Diagnosis not present

## 2019-03-21 LAB — HM MAMMOGRAPHY: HM Mammogram: NORMAL (ref 0–4)

## 2019-03-21 NOTE — Unmapped (Signed)
Spoke to Ms. Krizek on the phone today to schedule a follow-up appointment.  She is doing ok; she has some increased anxiety related to wanting to know the results of her breathing tests since she had to use albuterol this time around.  Refilled clonazepam, lamotrigine, duloxetine and trazodone.  Sent the latter three to [facility] and clonazepam to [facility].
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
## 2019-03-25 ENCOUNTER — Ambulatory Visit: Payer: Self-pay

## 2019-03-25 ENCOUNTER — Other Ambulatory Visit: Payer: Self-pay

## 2019-03-25 DIAGNOSIS — M3213 Lung involvement in systemic lupus erythematosus: Secondary | ICD-10-CM

## 2019-03-25 DIAGNOSIS — R0789 Other chest pain: Secondary | ICD-10-CM

## 2019-03-25 DIAGNOSIS — I10 Essential (primary) hypertension: Secondary | ICD-10-CM

## 2019-03-25 NOTE — Patient Instructions (Signed)
Thank you allowing the Chronic Care Management Team to be a part of your care! It was a pleasure speaking with you today!  1. You are doing a great job managing your health through medication, diet, and exercise. Great Job!! 2. Continue to take all your medications as prescribed. 3. Utilize [employer] Over the Counter benefit for your over the counter medications and BP cuff [phone]) 4. Please use the resources below in your care plan for dental care 5. Also utilize the numbers below for access to med alert incase you were to fall in your home 6. When you have vertigo, please change positions slowly to prevent falls 7. Follow up with pulmonologist OR rheumatologist to discuss your recent pulmonary function test. If you are short of breath use your inhalers or seek emergency care if needed. 8. I have discussed your ongoing chest discomfort with Dr. Ancil Boozer. She will be happy to see you if you feel necessary. The good news is a heart condition has been ruled out.  CCM (Chronic Care Management) Team   Trish Fountain RN, BSN Nurse Care Coordinator  [phone]  Ruben Reason PharmD  Clinical Pharmacist  [phone]   Elliot Gurney, LCSW Clinical Social Worker [phone]  Goals Addressed            This Visit's Progress   . "I think I am doing well managing my health" (pt-stated)       Current Barriers:  . Film/video editor.   Nurse Case Manager Clinical Goal(s):  Marland Kitchen Over the next 30 days, the patient will demonstrate ongoing self health care management ability as evidenced by continuing to exercise daily, take medications as prescribed, follow a heart health/low sodium/diabetic diet, and contacting CCM RN CM if additional support and assistance with managing chronic conditions is needed  Interventions:  . Completed initial health assessment . Discussed patients health goals . Discussed ongoing engagement with CCM RN CM . Provided patient with resources for med alert  including:  [facility] plan 1 benefit) [phone]  Patient Self Care Activities:  . Self administers medications as prescribed . Attends all scheduled provider appointments . Calls pharmacy for medication refills . Attends church or other social activities . Performs ADL's independently . Performs IADL's independently . Calls provider office for new concerns or questions  Initial goal documentation     . I need to see a dentist but I just can't afford all the work (pt-stated)       Current Barriers:  Marland Kitchen Knowledge Deficits related to available dental clinics in the area . Knowledge Deficits related to dental benefits through her current [facility] Medicare plan . Film/video editor.   Nurse Case Manager Clinical Goal(s):  Marland Kitchen Over the next 14 days, patient will contact resources provided to get a quote on dental work needed  Interventions:  . Provided patient with local dental clinic contact information including:  [facility] [phone]  . Provided patient with a list of [place] in network dental providers in the [facility] area  [place], [place] [address] Phone: [phone]  [facility] [address] [place], [place] [address] Phone: [phone]  [place], [place] [address] Phone: [phone]  [place], [place] [address] Phone: [phone]   Patient Self Care Activities:  .  Call and schedule dental exam . Take all medications as prescribed . Attend all medical and dental appointment   Initial goal documentation     . I really need to make sure I am taking my medications correctly (pt-stated)   On track    Current Barriers:  Marland Kitchen Knowledge Deficits related to understanding  prescription instructions verbage  Nurse Case Manager Clinical Goal(s):  Marland Kitchen Over the next 30 days, patient will take ALL medications as prescribed including PRN medications as needed   Interventions:  . Assessed for medication compliance . Encouraged patient to utilize [facility] OTC program for OTC medications like D3  Patient Self Care Activities:  . Patient will read ALL medication labels thoroughly and take medications exactly as written . Patient will take "as needed" medications as needed and not scheduled even if the prescriptions says "every 6 hours" as needed  Please see past updates related to this goal by clicking on the "Past Updates" button in the selected goal         Print copy of patient instructions provided.   Telephone follow up appointment with care management team member scheduled for: 04/12/2019 at 10:00  SYMPTOMS OF A STROKE   You have any symptoms of stroke. "BE FAST" is an easy way to remember the main warning signs: ? B - Balance. Signs are dizziness, sudden trouble walking, or loss of balance. ? E - Eyes. Signs are trouble seeing or a sudden change in how you see. ? F - Face. Signs are sudden weakness or loss of feeling of the face, or the face or eyelid drooping on one side. ? A - Arms. Signs are weakness or loss of feeling in an arm. This happens suddenly and usually on one side of the body. ? S - Speech. Signs are sudden trouble speaking, slurred speech, or trouble understanding what people say. ? T - Time. Time to call emergency services. Write down what time symptoms started.  You have other signs of stroke, such as: ? A sudden, very bad headache with no known cause. ? Feeling sick to your stomach (nausea). ? Throwing up (vomiting). ? Jerky movements you cannot control (seizure).  SYMPTOMS OF A HEART ATTACK  What are the signs or symptoms? Symptoms of this condition include:  Chest pain. It may feel like: ? Crushing or squeezing. ? Tightness,  pressure, fullness, or heaviness.  Pain in the arm, neck, jaw, back, or upper body.  Shortness of breath.  Heartburn.  Indigestion.  Nausea.  Cold sweats.  Feeling tired.  Sudden lightheadedness.

## 2019-03-25 NOTE — Chronic Care Management (AMB) (Addendum)
Chronic Care Management   Follow Up Note   03/25/2019 Name: ALITHIA ZAVALETA MRN: [id number] DOB: 1956/01/26  Referred by: Steele Sizer, MD Reason for referral : Chronic Care Management (completion of initial assessment)   Subjective: "I think I am doing well managing my health. All my numbers are looking good"   Objective:  Lab Results  Component Value Date   HGBA1C 5.4 02/07/2019   HGBA1C 5.5 09/28/2018   HGBA1C 5.4 05/14/2018   Lab Results  Component Value Date   MICROALBUR 1.8 10/31/2017   LDLCALC 69 02/07/2019   CREATININE 0.72 01/21/2019   BP Readings from Last 3 Encounters:  02/07/19 128/68  01/21/19 129/69  11/20/18 128/82   Lab Results  Component Value Date   CHOL 143 02/07/2019   CHOL 220 (H) 10/31/2017   CHOL 165 02/14/2017   Lab Results  Component Value Date   HDL 63 02/07/2019   HDL 62 10/31/2017   HDL 73 02/14/2017   Lab Results  Component Value Date   LDLCALC 69 02/07/2019   LDLCALC 138 (H) 10/31/2017   LDLCALC 82 02/14/2017   Lab Results  Component Value Date   TRIG 39 02/07/2019   TRIG 101 10/31/2017   TRIG 51 02/14/2017   Lab Results  Component Value Date   CHOLHDL 2.3 02/07/2019   CHOLHDL 3.5 10/31/2017   CHOLHDL 2.3 02/14/2017   No results found for: LDLDIRECT  Assessment:  Magalie Almon is a 63 year old female patient of Dr. Steele Sizer, who was referred to the CCM Team by her health plan. She was consented to services and CCM RN CM attempted to complete initial health assessment today. Upon medication review, it was apparent that patient needed extensive review and medications needed reconciling. Today, RN CM met with patient via telephone to complete health assessment and discuss patients health goals.  Review of patient status, including review of consultants reports, relevant laboratory and other test results, and collaboration with appropriate care team members and the patient's provider was performed as part of  comprehensive patient evaluation and provision of chronic care management services.    Goals Addressed            This Visit's Progress   . "I think I am doing well managing my health" (pt-stated)       Ms. Hardigree is doing well managing her health with diet, exercise, and medication compliance. She follows up with provider appointments as scheduled. She states she eats a heart healthy, low sodium, low carb diet and exercises daily. She is now taking all her medications as prescribed and her PRN medications as needed.  Current Barriers:  . Film/video editor.   Nurse Case Manager Clinical Goal(s):  Marland Kitchen Over the next 30 days, the patient will demonstrate ongoing self health care management ability as evidenced by continuing to exercise daily, take medications as prescribed, follow a heart health/low sodium/diabetic diet, and contacting CCM RN CM if additional support and assistance with managing chronic conditions is needed  Interventions:  . Completed initial health assessment . Discussed patients health goals . Discussed ongoing engagement with CCM RN CM . Provided patient with resources for med alert including:  [facility] plan 1 benefit) [phone]  Patient Self Care Activities:  . Self administers medications as prescribed . Attends all scheduled provider appointments . Calls pharmacy for medication refills . Attends church or other social activities . Performs ADL's independently . Performs IADL's independently . Calls provider  office for new concerns or questions  Initial goal documentation     . I need to see a dentist but I just can't afford all the work (pt-stated)       Ms. Pippins is in need of dental work, but states it was too costly. She is unaware she has dental benefits with [employer] that includes a "free" cleaning every 6 months. She states she has a crown that needs replacing along with other work. She is provided  today with two resources for dental care that is income based.  Current Barriers:  Marland Kitchen Knowledge Deficits related to available dental clinics in the area . Knowledge Deficits related to dental benefits through her current [facility] Medicare plan . Film/video editor.   Nurse Case Manager Clinical Goal(s):  Marland Kitchen Over the next 14 days, patient will contact resources provided to get a quote on dental work needed  Interventions:  . Provided patient with local dental clinic contact information including:  [facility] [phone]  . Provided patient with a list of [place] in network dental providers in the [facility] area  [place], [place] [address] Phone: [phone]  [place], [place] [address] Phone: [phone]  [place], [place] [address] Phone: [phone]  [place], [place] [address] Phone: [phone]   Patient Self Care Activities:  . Call and schedule dental exam . Take all medications as prescribed . Attend all medical and dental appointment   Initial goal documentation     . I really need to make sure I am taking my medications correctly (pt-stated)   On track    Current Barriers:  Marland Kitchen Knowledge Deficits related to understanding prescription instructions verbage  Nurse Case Manager Clinical Goal(s):  Marland Kitchen Over the next 30 days, patient will take ALL medications as prescribed including PRN medications as needed   Interventions:  . Assessed for medication compliance . Encouraged patient to utilize [facility] OTC program for OTC medications like D3  Patient Self Care Activities:  . Patient will read ALL medication labels thoroughly and take medications exactly as written . Patient will take "as needed" medications as  needed and not scheduled even if the prescriptions says "every 6 hours" as needed  Please see past updates related to this goal by clicking on the "Past Updates" button in the selected goal          Telephone follow up appointment with care management team member scheduled for: 2 weeks    Raylene Carmickle E. Rollene Rotunda, RN, BSN Nurse Care Coordinator [facility] Care Management  [phone]

## 2019-03-28 MED ORDER — AEROCHAMBER MV SPACER
Freq: Two times a day (BID) | 0 refills | 1 days | Status: CP
Start: 2019-03-28 — End: ?

## 2019-03-28 MED ORDER — FLUTICASONE PROPIONATE 110 MCG/ACTUATION HFA AEROSOL INHALER
Freq: Two times a day (BID) | RESPIRATORY_TRACT | 2 refills | 0 days | Status: CP
Start: 2019-03-28 — End: 2020-03-27

## 2019-03-29 ENCOUNTER — Ambulatory Visit: Payer: Self-pay | Admitting: Family Medicine

## 2019-03-29 ENCOUNTER — Telehealth: Payer: Self-pay | Admitting: Family Medicine

## 2019-03-29 NOTE — Telephone Encounter (Signed)
  Pt. Reports she saw Dr. Ancil Boozer the beginning of July with generalized itching. States this week her Atarax is not helping. Is having itching to arms and legs. No rash No other symptoms. Spoke with Cassandra and will forward triage note.   Answer Assessment - Initial Assessment Questions 1. DESCRIPTION: "Describe the itching you are having."     Itching 2. SEVERITY: "How bad is it?"    - MILD - doesn't interfere with normal activities   - MODERATE-SEVERE: interferes with work, school, sleep, or other activities      Moderate 3. SCRATCHING: "Are there any scratch marks? Bleeding?"     No 4. ONSET: "When did this begin?"      1-2 weeks ago 5. CAUSE: "What do you think is causing the itching?" (ask about swimming pools, pollen, animals, soaps, etc.)     Unsure 6. OTHER SYMPTOMS: "Do you have any other symptoms?"      No 7. PREGNANCY: "Is there any chance you are pregnant?" "When was your last menstrual period?"     No  Protocols used: ITCHING [facility]

## 2019-03-29 NOTE — Telephone Encounter (Signed)
Called at 03/29/2019 at 5:11 p.m. voicemail was full and unable to leave voicemail regarding referral.

## 2019-03-29 NOTE — Telephone Encounter (Signed)
Copied from [place] [id number]. Topic: Quick Communication - Rx Refill/Question >> Mar 29, 2019  3:34 PM Nils Flack, Marland Kitchen wrote: Medication:omeprazole   Has the patient contacted their pharmacy? Yes.   (Agent: If no, request that the patient contact the pharmacy for the refill.) (Agent: If yes, when and what did the pharmacy advise?)  Preferred Pharmacy (with phone number or street [facility]): [facility]  Pharm told her to call office   Agent: Please be advised that RX refills may take up to 3 business days. We ask that you follow-up with your pharmacy.

## 2019-04-03 ENCOUNTER — Encounter: Admit: 2019-04-03 | Discharge: 2019-04-04 | Payer: MEDICARE | Attending: Dermatology | Primary: Dermatology

## 2019-04-03 DIAGNOSIS — L409 Psoriasis, unspecified: Secondary | ICD-10-CM | POA: Diagnosis not present

## 2019-04-03 DIAGNOSIS — L299 Pruritus, unspecified: Secondary | ICD-10-CM | POA: Diagnosis not present

## 2019-04-03 MED ORDER — HALOBETASOL PROPIONATE 0.05 % TOPICAL OINTMENT
Freq: Two times a day (BID) | TOPICAL | 1 refills | 0 days | Status: CP
Start: 2019-04-03 — End: 2019-05-08

## 2019-04-03 NOTE — Unmapped (Addendum)
Patient Education        Psoriasis: Care Instructions  Your Care Instructions  Psoriasis (say suh-RY-uh-sus) is a long-term skin problem that causes thick, white, silvery, or red patches on the skin. The patches may be small or large, and they occur most often on the knees, elbows, scalp, hands, feet, or lower back.  The skin may be scaly. If the condition is severe, your skin can become itchy and tender. Psoriasis also can be embarrassing if the patches are on visible areas.  You can treat psoriasis with good care at home and with medicine from your doctor. You may put medicine on your skin and take pills or have shots to stop the redness and swelling. Your doctor also may suggest ultraviolet light treatments.  Follow-up care is a key part of your treatment and safety. Be sure to make and go to all appointments, and call your doctor if you are having problems. It's also a good idea to know your test results and keep a list of the medicines you take.  How can you care for yourself at home?  ?? If your doctor prescribes medicine, use it exactly as prescribed. Follow your doctor's advice for sunlight or ultraviolet light treatment. Call your doctor if you think you are having a problem with your medicine.  ?? Protect your skin:  ? Keep your skin moist. After bathing, put an ointment, cream, or lotion on your skin while it is still damp. This seals in moisture. Use over-the-counter products that your doctor suggests. These may include Cetaphil, Lubriderm, or Eucerin. Petroleum jelly (such as Vaseline) and vegetable shortening (such as Crisco) also work.  ? If you have psoriasis on your scalp, use a shampoo with salicylic acid, such as Neutrogena T/Sal.  ? Avoid harsh skin products, such as those that contain alcohol.  ? Cover your skin in cold weather.  ? Try to prevent sunburn. Although short periods of sun exposure reduce psoriasis in most people, too much sun can damage the skin and cause skin cancer. In addition, sunburns can trigger psoriasis. Use sunscreen on areas of your skin that do not have psoriasis. Make sure to use a broad-spectrum sunscreen that has a sun protection factor (SPF) of 30 or higher. Use it every day, even when it is cloudy.  ? Take care to avoid accidents such as cutting or scraping your skin. An injury to the skin can cause psoriasis patches to form anywhere on the body, including the area of the injury.  ? Avoid tight shoes, clothing, watchbands, and hats. These may irritate your skin.  ? Use a vaporizer or humidifier to add moisture to your bedroom. Follow the directions for cleaning the machine.  ?? Try making one or more changes to your daily habits to help with managing your psoriasis. For example:  ? Try to control stress and anxiety. They may cause psoriasis to appear suddenly or can make symptoms worse.  ? If you smoke, think about quitting. If you need help quitting, talk to your doctor about stop-smoking programs and medicines.  ? If you drink, limit or reduce the amount of alcohol you drink.  ? If you are overweight, see if you can lose some weight.  ?? Seek support from family and friends. Talk to a counselor or other professional if you feel sad about your condition and need more help.  When should you call for help?   Call your doctor now or seek immediate medical care if:  ??  You have signs of infection, such as:  ? Increased pain, swelling, warmth, or redness.  ? Red streaks leading from the area.  ? Pus draining from the area.  ? A fever.  Watch closely for changes in your health, and be sure to contact your doctor if:  ?? You have swelling, stiffness, or pain in your joints.  ?? You do not get better as expected.  Where can you learn more?  Go to [facility] at https://myuncchart.org  [employer] [facility] under [employer]. Enter [phone] in the search box to learn more about Psoriasis: Care Instructions.  Current as of: July 05, 2018??????????????????????????????Content Version: 12.5  ?? 2006-2020 [employer]wise, Inc[employer].   Care instructions adapted under license by [facility]. If you have questions about a medical condition or this instruction, always ask your healthcare professional. Healthwise, Incorporated disclaims any warranty or liability for your use of this information.       Patient Education        Dry Skin: Care Instructions  Your Care Instructions  Dry skin is a common problem, especially in areas where the air is very dry. Dry skin can also become a problem as you get older and lose natural oils that keep your skin moist.  A tendency toward dry, itchy skin may run in families. Some problems with the body's defenses (immune system), allergies, or an infection with a fungus may also cause patches of dry skin.  An over-the-counter cream may help your dry skin. If your skin problem does not get better with home treatment, your doctor may prescribe ointment. You may need antibiotics if you have a skin infection.  Follow-up care is a key part of your treatment and safety. Be sure to make and go to all appointments, and call your doctor if you are having problems. It's also a good idea to know your test results and keep a list of the medicines you take.  How can you care for yourself at home?  Showers and baths  ?? Keep showers and baths short, and use warm or lukewarm water. Don't use hot water. It takes off more of your skin's natural oils.  ?? Use as little soap as you can. Choose a mild soap, such as Dove, Cetaphil, or Neutrogena. Or use a skin cleanser like Aquanil or Cetaphil.  ?? If you are taking a bath, use soap only at the very end. Then rinse off all traces of soap with fresh water. Gently pat your skin dry with a towel.  Skin creams and moisturizers  ?? Apply moisturizer or skin cream right away (within 3 minutes) after a bath or shower. Use a moisturizer at other times too, as often as you need it.  ?? Moisturizing creams are better than lotions. Try brands like CeraVe cream, Cetaphil cream, or Eucerin cream.  Other tips  ?? When washing clothes, use a small amount of detergent. Don't use fabric softeners or dryer sheets.  ?? For small areas of itchy skin, try an over-the-counter 1% hydrocortisone cream.  ?? If you have very dry hands, spread petroleum jelly (such as Vaseline) on your hands before bed. Wear thin cotton gloves while you sleep. If your feet are dry, spread Vaseline on them and wear socks while you sleep.  When should you call for help?   Call your doctor now or seek immediate medical care if:  ?? You have signs of infection, such as:  ? Pain, warmth, or swelling in the skin.  ?  Red streaks near a wound in the skin.  ? Pus coming from a wound in your skin.  ? A fever.  Watch closely for changes in your health, and be sure to contact your doctor if:  ?? You do not get better as expected.  Where can you learn more?  Go to [facility] at https://myuncchart.org  [employer] [facility] under [employer]. Enter [phone] in the search box to learn more about Dry Skin: Care Instructions.  Current as of: July 05, 2018??????????????????????????????Content Version: 12.5  ?? 2006-2020 Healthwise, Incorporated.   Care instructions adapted under license by [facility]. If you have questions about a medical condition or this instruction, always ask your healthcare professional. Healthwise, Incorporated disclaims any warranty or liability for your use of this information.

## 2019-04-03 NOTE — Unmapped (Signed)
DERMATOLOGY FOLLOW-UP NOTE    Assessment and Plan:    Inverse psoriasis - 2% bsa  - Biopsy c/w psoriasis  - Would continue try to avoid prednisone in the future if possible as this can flare psoriasis.   - Will continue to treat with topicals below.   - start halobetasol ointment   - Consider adding an oral agent if she continues to flare at next visit- could consider [facility] (although has mood d/o).   - Of note, she is not a good candidate for MTX given history of eosinophilic pneumonitis- rheumatology and pulmonology agree that this would not be a suitable option for her given her pulmonary history.      Diffuse Pruritus -  Denies new medication. Will perform systemic screening labs as below. Denies new medications however has multiple comorbidities and polypharmacy.  -Ok to use tac ointment sparingly  - T4, free; Future  - TSH; Future  - ALT; Future  - AST; Future  - CBC w/ Differential; Future  - Creatinine (renal failure); Future  - BUN (renal failure); Future  -dry skin care handout reviewed        RTC in 4 weeks  ______________________________________________________________________    CC:    Chief Complaint   Patient presents with   ??? Follow-up     f/u for inverse psoriasis, currently flaring and a lot if itching, has new areas that are itchy, patient is concerned about tx not working, no pain         HPI:  This is a pleasant 63 y.o. female last seen by Dr Orma Flaming on 2/20 who presents today for  psoriasis.  Primary Concern:  Location: psoriasis on the groin, buttocks  Duration: years  Treatment(s) / Modifying factor(s): clobetasol/triamcinolone   Associated symptoms/signs: itchy  Also has diffuse itching on the arms and legs. Present for a few weeks.         Final Diagnosis   Date Value Ref Range Status   10/15/2018   Final    Back, punch  - Psoriasis           Pertinent PMH:  Reviewed in Epic  SLE   Eosinophilic pneumonitis   Inverse Psoriasis   She was initially diagnosed in 2006 with SLE presenting with episcleritis and arthralgia with serology showing +ANA, +dsDNA. ??She also has stage I CKD felt secondary not due to SLE but due to HTN and obesity.     ROS: Baseline state of health.  Denies fevers, chills. No other skin complaints except noted per HPI.     PE:  Gen: WD, WN, NAD, A&O  Skin: Per patient request, examination of the head, neck, chest, back, abdomen,buttocks, bilateral upper extremities,and bilateral lower extremities was performed and significant for the below. All other areas examined were normal or had no significant findings.   Slight xerosis  Few well demarcated scaly plaques on the buttocks, inguinal folds, lower back

## 2019-04-04 NOTE — Unmapped (Signed)
This patient would like her labs sent to [facility] in [place] on [address].  FAX [phone].

## 2019-04-08 DIAGNOSIS — L299 Pruritus, unspecified: Secondary | ICD-10-CM | POA: Diagnosis not present

## 2019-04-08 NOTE — Unmapped (Signed)
Orders written for [facility]

## 2019-04-09 LAB — CBC W/ DIFFERENTIAL
BANDED NEUTROPHILS ABSOLUTE COUNT: 0 10*3/uL (ref 0.0–0.1)
BASOPHILS ABSOLUTE COUNT: 0.1 10*3/uL (ref 0.0–0.2)
BASOPHILS RELATIVE PERCENT: 2 %
EOSINOPHILS ABSOLUTE COUNT: 0.2 10*3/uL (ref 0.0–0.4)
EOSINOPHILS RELATIVE PERCENT: 4 %
HEMATOCRIT: 40.3 % (ref 34.0–46.6)
HEMOGLOBIN: 13.3 g/dL (ref 11.1–15.9)
IMMATURE GRANULOCYTES: 0 %
LYMPHOCYTES ABSOLUTE COUNT: 1.5 10*3/uL (ref 0.7–3.1)
LYMPHOCYTES RELATIVE PERCENT: 36 %
MEAN CORPUSCULAR HEMOGLOBIN CONC: 33 g/dL (ref 31.5–35.7)
MEAN CORPUSCULAR VOLUME: 84 fL (ref 79–97)
MONOCYTES ABSOLUTE COUNT: 0.5 10*3/uL (ref 0.1–0.9)
MONOCYTES RELATIVE PERCENT: 12 %
NEUTROPHILS RELATIVE PERCENT: 46 %
PLATELET COUNT: 349 10*3/uL (ref 150–450)
RED BLOOD CELL COUNT: 4.78 x10E6/uL (ref 3.77–5.28)
RED CELL DISTRIBUTION WIDTH: 12.8 % (ref 11.7–15.4)

## 2019-04-09 LAB — ALT (SGPT): Alanine aminotransferase:CCnc:Pt:Ser/Plas:Qn:: 13

## 2019-04-09 LAB — BLOOD UREA NITROGEN: Urea nitrogen:MCnc:Pt:Ser/Plas:Qn:: 12

## 2019-04-09 LAB — FREE T4: Thyroxine.free:MCnc:Pt:Ser/Plas:Qn:: 1.09

## 2019-04-09 LAB — GFR MDRD AF AMER: Glomerular filtration rate/1.73 sq M.predicted.black:ArVRat:Pt:Ser/Plas/Bld:Qn:Creatinine-based formula (CKD-EPI): 65

## 2019-04-09 LAB — AST (SGOT): Aspartate aminotransferase:CCnc:Pt:Ser/Plas:Qn:: 24

## 2019-04-09 LAB — CREATININE: GFR MDRD AF AMER: 65 mL/min/{1.73_m2}

## 2019-04-09 LAB — THYROID STIMULATING HORMONE: Thyrotropin:ACnc:Pt:Ser/Plas:Qn:Detection limit <= 0.005 mIU/L: 1.35

## 2019-04-09 LAB — EOSINOPHILS ABSOLUTE COUNT: Eosinophils:NCnc:Pt:Bld:Qn:Automated count: 0.2

## 2019-04-10 NOTE — Unmapped (Signed)
Patient labs stable. Patient notified via mychart.

## 2019-04-12 ENCOUNTER — Other Ambulatory Visit: Payer: Self-pay

## 2019-04-12 ENCOUNTER — Ambulatory Visit (INDEPENDENT_AMBULATORY_CARE_PROVIDER_SITE_OTHER): Payer: Medicare Other

## 2019-04-12 DIAGNOSIS — I10 Essential (primary) hypertension: Secondary | ICD-10-CM

## 2019-04-12 DIAGNOSIS — M797 Fibromyalgia: Secondary | ICD-10-CM

## 2019-04-12 NOTE — Patient Instructions (Signed)
Thank you allowing the Chronic Care Management Team to be a part of your care! It was a pleasure speaking with you today!  1. Please continue to take all medications as prescribed 2. Continue to follow up with all providers as scheduled 3. Call Dr. Ancil Boozer office to discuss need for antibiotic prior to dental clinic 4. Call suggested dental providers to schedule ongoing cleanings   CCM (Chronic Care Management) Team   Trish Fountain RN, BSN Nurse Care Coordinator  [phone]  Ruben Reason PharmD  Clinical Pharmacist  [phone]   Elliot Gurney, LCSW Clinical Social Worker [phone]  Goals Addressed            This Visit's Progress   . COMPLETED: "I think I am doing well managing my health" (pt-stated)       Current Barriers:  . Film/video editor.   Nurse Case Manager Clinical Goal(s):  Marland Kitchen Over the next 30 days, the patient will demonstrate ongoing self health care management ability as evidenced by continuing to exercise daily, take medications as prescribed, follow a heart health/low sodium/diabetic diet, and contacting CCM RN CM if additional support and assistance with managing chronic conditions is needed  Interventions:  . Completed initial health assessment . Discussed patients health goals . Discussed ongoing engagement with CCM RN CM . Provided patient with resources for med alert including:  [facility] plan 1 benefit) [phone]  Patient Self Care Activities:  . Self administers medications as prescribed . Attends all scheduled provider appointments . Calls pharmacy for medication refills . Attends church or other social activities . Performs ADL's independently . Performs IADL's independently . Calls provider office for new concerns or questions  Initial goal documentation     . I need to see a dentist but I just can't afford all the work (pt-stated)       Current Barriers:  Marland Kitchen Knowledge  Deficits related to available dental clinics in the area . Knowledge Deficits related to dental benefits through her current [facility] Medicare plan . Film/video editor.   Nurse Case Manager Clinical Goal(s):  Marland Kitchen Over the next 14 days, patient will contact resources provided to get a quote on dental work needed- goal met 04/12/2019 . Over the next 14 days, patient will contact resources provided for ongoing dental care/cleanings  Interventions:   . Provided patient with a list of [place] in network dental providers in the [place] [facility] area  [place], [place] [address] Phone: [phone]  [place], [place] [address] Phone: [phone]  [place], [place] [address] Phone: [phone]  [place], [place] [address] Phone: [phone]   Patient Self Care Activities:  . Call and schedule dental exam . Take all medications as prescribed . Attend all medical and dental appointment   Initial goal documentation     . COMPLETED: I really need to make sure I am taking my medications correctly (pt-stated)       Current Barriers:  Marland Kitchen Knowledge Deficits related to understanding prescription instructions verbage  Nurse Case Manager Clinical Goal(s):  Marland Kitchen Over the next 30 days, patient will take ALL medications as prescribed including PRN medications as needed   Interventions:  . Assessed for medication compliance . Encouraged patient to utilize [facility] OTC program for OTC medications like D3  Patient  Self Care Activities:  . Patient will read ALL medication labels thoroughly and take medications exactly as written . Patient will take "as needed" medications as needed and not scheduled even if the prescriptions says "every 6 hours" as needed  Please see past updates related to this goal by clicking on the "Past Updates"  button in the selected goal         The patient verbalized understanding of instructions provided today and declined a print copy of patient instruction materials.   Telephone follow up appointment with care management team member scheduled for: 2 weeks  SYMPTOMS OF A STROKE   You have any symptoms of stroke. "BE FAST" is an easy way to remember the main warning signs: ? B - Balance. Signs are dizziness, sudden trouble walking, or loss of balance. ? E - Eyes. Signs are trouble seeing or a sudden change in how you see. ? F - Face. Signs are sudden weakness or loss of feeling of the face, or the face or eyelid drooping on one side. ? A - Arms. Signs are weakness or loss of feeling in an arm. This happens suddenly and usually on one side of the body. ? S - Speech. Signs are sudden trouble speaking, slurred speech, or trouble understanding what people say. ? T - Time. Time to call emergency services. Write down what time symptoms started.  You have other signs of stroke, such as: ? A sudden, very bad headache with no known cause. ? Feeling sick to your stomach (nausea). ? Throwing up (vomiting). ? Jerky movements you cannot control (seizure).  SYMPTOMS OF A HEART ATTACK  What are the signs or symptoms? Symptoms of this condition include:  Chest pain. It may feel like: ? Crushing or squeezing. ? Tightness, pressure, fullness, or heaviness.  Pain in the arm, neck, jaw, back, or upper body.  Shortness of breath.  Heartburn.  Indigestion.  Nausea.  Cold sweats.  Feeling tired.  Sudden lightheadedness.

## 2019-04-12 NOTE — Chronic Care Management (AMB) (Signed)
Chronic Care Management   Follow Up Note   04/12/2019 Name: Nicole Ballard MRN: [id number] DOB: Jun 11, 1956  Referred by: Steele Sizer, MD Reason for referral : Chronic Care Management (follow up)   Subjective: "I have done everything you had me to do and I am so proud of myself"   Objective:  BP Readings from Last 3 Encounters:  02/07/19 128/68  01/21/19 129/69  11/20/18 128/82   Lab Results  Component Value Date   HGBA1C 5.4 02/07/2019   HGBA1C 5.5 09/28/2018   HGBA1C 5.4 05/14/2018   Lab Results  Component Value Date   MICROALBUR 1.8 10/31/2017   LDLCALC 69 02/07/2019   CREATININE 0.72 01/21/2019   Lab Results  Component Value Date   CHOL 143 02/07/2019   HDL 63 02/07/2019   LDLCALC 69 02/07/2019   TRIG 39 02/07/2019   CHOLHDL 2.3 02/07/2019     Assessment: Nicole Ballard is a 63 year old female patient of Dr. Steele Sizer, who was referred to the CCM Team by her health plan. She was consented to services and CCM RN CM attempted to complete initial health assessment today. Upon medication review, it was apparent that patient needed extensive review and medications needed reconciling.  RN CM met with patient via telephone to complete health assessment and discuss patients health goals. Today, I followed up with Nicole Ballard to discuss progression towards those goals.  Review of patient status, including review of consultants reports, relevant laboratory and other test results, and collaboration with appropriate care team members and the patient's provider was performed as part of comprehensive patient evaluation and provision of chronic care management services.    Goals Addressed            This Visit's Progress   . COMPLETED: "I think I am doing well managing my health" (pt-stated)       Nicole Ballard states she received the educational materials and after visit summary provided to her. She has contacted [facility] who has sent her a free life alert system.  It is easy to install through her land telephone line. She has a necklace that she will wear at all time.    Current Barriers:  . Film/video editor.   Nurse Case Manager Clinical Goal(s):  Marland Kitchen Over the next 30 days, the patient will demonstrate ongoing self health care management ability as evidenced by continuing to exercise daily, take medications as prescribed, follow a heart health/low sodium/diabetic diet, and contacting CCM RN CM if additional support and assistance with managing chronic conditions is needed  Interventions:  . Completed initial health assessment . Discussed patients health goals . Discussed ongoing engagement with CCM RN CM . Provided patient with resources for med alert including:  [facility] plan 1 benefit) [phone]  Patient Self Care Activities:  . Self administers medications as prescribed . Attends all scheduled provider appointments . Calls pharmacy for medication refills . Attends church or other social activities . Performs ADL's independently . Performs IADL's independently . Calls provider office for new concerns or questions  Initial goal documentation     . I need to see a dentist but I just can't afford all the work (pt-stated)       Nicole Ballard contacted [facility] as discuss and had a tooth pulled. Unfortunately, they are not taking additional patient for regular cleanings at this time. She needs an antibiotic prescription prior to any dental procedure. She plans to contact  PCP for prescription request and then contact one of the providers below for ongoing dental cleanings as covered by her health plan.   Current Barriers:  Marland Kitchen Knowledge Deficits related to available dental clinics in the area . Knowledge Deficits related to dental benefits through her current [facility] Medicare plan . Film/video editor.   Nurse Case Manager Clinical Goal(s):  Marland Kitchen Over the next 14 days,  patient will contact resources provided to get a quote on dental work needed- goal met 04/12/2019 . Over the next 14 days, patient will contact resources provided for ongoing dental care/cleanings  Interventions:   . Provided patient with a list of [place] in network dental providers in the [facility] area  [place], [place] [address] Phone: [phone]  [place], [place] [address] Phone: [phone]  [place], [place] [address] Phone: [phone]  [place], [place] [address] Phone: [phone]   Patient Self Care Activities:  . Call and schedule dental exam . Take all medications as prescribed . Attend all medical and dental appointment   Initial goal documentation     . COMPLETED: I really need to make sure I am taking my medications correctly (pt-stated)       Nicole Ballard is now able to verbalize the difference between as needed and daily administration. She is able to verbalize which medications she takes on a daily schedule and which medications she should take when symptoms occur.  Current Barriers:  Marland Kitchen Knowledge Deficits related to understanding prescription instructions verbage  Nurse Case Manager Clinical Goal(s):  Marland Kitchen Over the next 30 days, patient will take ALL medications as prescribed including PRN medications as needed   Interventions:  . Assessed for medication compliance . Encouraged patient to utilize [facility] OTC program for OTC medications like D3  Patient Self Care Activities:  . Patient will read ALL medication labels thoroughly and take medications exactly as written . Patient will take "as needed" medications as needed and not scheduled even if the prescriptions says "every 6 hours" as needed  Please see past updates related to this goal by clicking on the "Past Updates" button  in the selected goal          Telephone follow up appointment with care management team member scheduled for: 2 weeks    Marcio Hoque E. Rollene Rotunda, RN, BSN Nurse Care Coordinator [facility] Care Management  [phone]

## 2019-04-15 ENCOUNTER — Encounter: Admit: 2019-04-15 | Discharge: 2019-04-16 | Payer: MEDICARE | Attending: Psychologist | Primary: Psychologist

## 2019-04-15 DIAGNOSIS — F331 Major depressive disorder, recurrent, moderate: Secondary | ICD-10-CM

## 2019-04-15 DIAGNOSIS — F4310 Post-traumatic stress disorder, unspecified: Principal | ICD-10-CM

## 2019-04-15 DIAGNOSIS — F411 Generalized anxiety disorder: Secondary | ICD-10-CM

## 2019-04-15 DIAGNOSIS — F410 Panic disorder [episodic paroxysmal anxiety] without agoraphobia: Secondary | ICD-10-CM

## 2019-04-15 NOTE — Unmapped (Signed)
[facility]   Confidential Psychological Therapy Session      Patient Name: CHARONDA HEFTER  Medical Record Number: [id number]  Date of Service: April 15, 2019  Attending Psychologist: Caroline More, PhD  CPT Procedure Codes: 81191 for 60 mins of face to face counseling    Due to the current declared state emergency during the coronavirus pandemic, all non-urgent medical and mental health visits have been triaged to either be delayed or take place virtually via phone or videoconferencing.   Due to the need for continued patient interaction for mental health care, pain management, and care coordination that necessitates the involvement of this provider, this visit was performed face to face using interactive technology using a HIPPA compliant audio/visual platform. This patient will be scheduled for face to face visits in the future once it has been deemed safe.      We reviewed confidentiality today. The patient was present at home (location and contact information confirmed), attended this visit alone, and consented to this virtual pain psychology visit.     Visit modifiers:   GT for Interactive Technology and CR for catastrophe/disaster related due to coronavirus pandemic    REFERRING PHYSICIAN: Clarene Essex, MD    CHIEF COMPLAINT AND REASON FOR VISIT: pain coping skills, CBT to address depression and anxiety in the setting of chronic pain    SUBJECTIVE / HISTORY OF PRESENT ILLNESS: Ms.  Hayter is a very pleasant 63 y.o.  female from [place], [place] with multiple chronic pain complaints related to fibromyalgia and lupus who initially met with me in October 2016, and which time she was diagnosed with severe depression, PTSD, panic disorder, and generalized anxiety.The patient returns for a therapy session today. Last follow up with me was 02/18/2019.    Patient doing fairly well today, with full, bright affect, but reported feeling a little down.  Describes noticing low-grade depression and associated reasons particularly social isolation.  Connects this to COVID, fear of exposure, and appropriate lifestyle changes.  Patient processed his thoughts and feelings related to others not wearing masks.  Utilized a mindfulness approach to emotions.  Reviewed warning signs related to low-grade depression including signs and it may be worsening.  Connected back to prior history of severe depression.  Patient denies any SI or safety concerns.  Acknowledges that she has good insight into her mood and adjustment at this time.  Spent extra time today addressing needs to address to work on low-grade depression.  Patient continues to practice breathing strategies which target anxiety, and notes that it helps.  Still stays connected to much from her family, by phone 1-2 times a week.  This includes multiple family members 1-2 times per week.  Also is now watching her 57-year-old granddaughter during the day while the mother works.  However the granddaughter spends considerable time on YouTube, not socially engaging.  Reviewed problem solving related to ways to have more social interaction, albeit safely.  Also encouraged daily behavioral activation.  Is getting outside walking 5 days/week.  Commended on doing this.  Provided psychoeducation related to the benefits of exercise and neurotransmitter release for depression and anxiety.        OBJECTIVE / MENTAL STATUS:    Appearance:   Appears stated age and Clean/Neat   Motor:  No abnormal movements   Speech/Language:   Normal rate, volume, tone, fluency   Mood:  Depressed and Anxious   Affect:  Bright, full   Thought process:  Logical, linear,  clear, coherent, goal directed   Thought content:    Denies SI, HI, self harm, delusions, obsessions, paranoid ideation, or ideas of reference   Perceptual disturbances:    Denies auditory and visual hallucinations, behavior not concerning for response to internal stimuli   Orientation:  Oriented to person, place, time, and general circumstances   Attention:  Able to fully attend without fluctuations in consciousness   Concentration:  Able to fully concentrate and attend   Memory:  Immediate, short-term, long-term, and recall grossly intact    Fund of knowledge:   Consistent with level of education and development   Insight:    Fair   Judgment:   Intact   Impulse Control:  Intact     DIAGNOSTIC IMPRESSION:   Post Traumatic Stress Disorder (PTSD)  Generalized Anxiety Disorder (GAD)  Panic Disorder  Major Depressive Disorder, moderate, recurrent  Chronic pain syndrome  Fibromyalgia  Lupus    ASSESSMENT:   Ms.  Najera is a very pleasant 63 y.o.  female from [place], [place] with multiple chronic pain complaints related to lupus, arthritis, and fibromyalgia. She was previously seen in our clinic from 2012-2014, and reestablished care with Dr. Fayrene Fearing in August 2016. The patient is struggling with depression and anxiety, but is very motivated to participate in intensive, multidisciplinary treatment to address the connection between depression, anxiety and pain. She has a long-standing history of depression and anxiety, and has worked with outpatient psychiatrist and therapist over the years. She is currently established with [facility] psychiatry, and is tapering off of Klonopin.  Depression, anxiety, and panic have all improved.  Has been diagnosed with diabetes, and is managing it well with p.o. medication and dietary changes.  Has lost 50+ pounds.  Patient presents with low-grade depression related to COVID and social isolation.  Focused on this today, with cognitive and behavioral coping strategies.      PLAN:   (1) Psychotherapy - Continue CBT. CBT will be used to address PTSD, MDD, Panic D/o, GAD, and chronic pain.     --Behavioral Activation - Encouraged behavioral activation.  Is doing great, walking 5 days/week  --Downloaded and uses Insight Timer, guided relaxation app, for nightly practice.  --Continues to utilize diaphragmatic breathing daily    (2) Psychiatry - Pt currently established with [facility].      (3) Nutritionist - saw a nutritionist and found it helpful. Has lost weight, related to nausea. Exercising more too. Lost 50+ lbs in last year (off steriods for quite some time now)    (4) Safety - Pt denies any current or recent SI or safety concerns. Knows to call 911 or go to her local ED. Also previously given [facility].      (5) follow-up - with me 05/21/2019

## 2019-04-19 ENCOUNTER — Other Ambulatory Visit: Payer: Self-pay

## 2019-04-19 ENCOUNTER — Encounter: Payer: Self-pay | Admitting: Family Medicine

## 2019-04-19 ENCOUNTER — Ambulatory Visit (INDEPENDENT_AMBULATORY_CARE_PROVIDER_SITE_OTHER): Payer: Medicare Other | Admitting: Family Medicine

## 2019-04-19 DIAGNOSIS — R110 Nausea: Secondary | ICD-10-CM | POA: Diagnosis not present

## 2019-04-19 DIAGNOSIS — F339 Major depressive disorder, recurrent, unspecified: Secondary | ICD-10-CM

## 2019-04-19 DIAGNOSIS — R42 Dizziness and giddiness: Secondary | ICD-10-CM

## 2019-04-19 DIAGNOSIS — G43909 Migraine, unspecified, not intractable, without status migrainosus: Secondary | ICD-10-CM

## 2019-04-19 MED ORDER — ONDANSETRON HCL 4 MG PO TABS: ORAL_TABLET | ORAL | 0 refills | Status: DC

## 2019-04-19 MED ORDER — PREDNISONE 10 MG PO TABS: 10.0000 mg | ORAL_TABLET | Freq: Two times a day (BID) | ORAL | 0 refills | Status: DC

## 2019-04-19 NOTE — Progress Notes (Signed)
Name: Nicole Ballard   MRN: [id number]    DOB: Aug 10, 1956   Date:04/19/2019       Progress Note  Subjective  Chief Complaint  Chief Complaint  Patient presents with  . Pain    Right Side Head and Neck Pain  . Dizziness    I connected with  [name]  on 04/19/19 at  7:40 AM EDT by a video enabled telemedicine application and verified that I am speaking with the correct person using two identifiers.  I discussed the limitations of evaluation and management by telemedicine and the availability of in person appointments. The patient expressed understanding and agreed to proceed. Staff also discussed with the patient that there may be a patient responsible charge related to this service. Patient Location: at home  Provider Location: [facility]   HPI  Right hemicrania: she states symptoms started with right side head pain that started a few weeks ago, she states pain  radiates from right side of head down right side of neck and right shoulder, it is daily but not constant. She states it can happen multiple times a day and duration of episodes varies . Pain is described as dull like. She states the pain makes the nausea worse and also vertigo more frequent and intense . She called her neurosurgeon for a follow up but states she needs a referral to be seen. She has a history of pituitary adenoma. She states her right eye hurst and also has double vision. No weakness , tingling or numbness. She states it has been difficulty doing activity of daily living but still able to do it slowly. She still has meclizine and zofran. She called so I could place the referral to neurosurgeon at [place] her MRI done 09/2018 and pituitary was back to normal. Explained it may be hemicrania, I don't mind placing referral to neurosurgeon but we will try a round of prednisone    Patient Active Problem List   Diagnosis Date Noted  . Hyperglycemia 10/23/2017  . Vertigo 09/29/2016  .  Bronchiolitis obliterans organizing pneumonia ([place]) 09/29/2016  . Neck pain, musculoskeletal 12/15/2015  . Anxiety about health 09/29/2015  . Abnormal CAT scan 04/11/2015  . Auditory impairment 04/11/2015  . Insomnia, persistent 04/11/2015  . Chronic kidney disease (CKD), stage I 04/11/2015  . Chronic nonmalignant pain 04/11/2015  . Chronic constipation 04/11/2015  . Dyslipidemia 04/11/2015  . Fibromyalgia 04/11/2015  . Gastro-esophageal reflux disease without esophagitis 04/11/2015  . Dysphonia 04/11/2015  . Absence of bladder continence 04/11/2015  . Low back pain 04/11/2015  . Eczema intertrigo 04/11/2015  . Keratosis pilaris 04/11/2015  . Chronic recurrent major depressive disorder ([place]) 04/11/2015  . Dysmetabolic syndrome 68/11/2120  . Neurogenic claudication 04/11/2015  . Perennial allergic rhinitis with seasonal variation 04/11/2015  . Abnormal presence of protein in urine 04/11/2015  . Seborrhea capitis 04/11/2015  . Moderate dysplasia of vulva 04/11/2015  . Vitamin D deficiency 04/11/2015  . Treadmill stress test negative for angina pectoris 03/05/2015  . Shortness of breath 05/20/2014  . Asthma, chronic 12/31/2013  . H/O total knee replacement 12/31/2013  . Episcleritis 09/26/2013  . Vocal cord dysfunction 05/28/2013  . Anxiety, generalized 03/19/2013  . Back pain 12/18/2012  . Pulmonary nodules 11/26/2012  . Lung nodule, multiple 11/26/2012  . Arthritis, degenerative 11/01/2012  . H/O aspiration pneumonitis 10/22/2012  . Postinflammatory pulmonary fibrosis ([place]) 10/22/2012  . Mixed incontinence 05/04/2012  . Benign neoplasm of pituitary gland and craniopharyngeal  duct ([place]) 12/29/2010  . Benign neoplasm of pituitary gland ([place]) 12/29/2010  . Lung involvement in systemic lupus erythematosus ([place]) 11/05/2010  . Chronic nausea 07/01/2008  . Benign hypertension 01/25/2005    Past Surgical History:  Procedure Laterality Date  . BRAIN SURGERY    . CHOLECYSTECTOMY     . VAGINAL HYSTERECTOMY      Family History  Problem Relation Age of Onset  . Breast cancer Mother   . Cancer Mother 4       Breast  . Cancer Father 47       Stomach  . Breast cancer Maternal Aunt     Social History   Socioeconomic History  . Marital status: Divorced    Spouse name: Not on file  . Number of children: 2  . Years of education: Not on file  . Highest education level: GED or equivalent  Occupational History  . Occupation: Disability  Social Needs  . Financial resource strain: Somewhat hard  . Food insecurity    Worry: Sometimes true    Inability: Sometimes true  . Transportation needs    Medical: No    Non-medical: No  Tobacco Use  . Smoking status: Never Smoker  . Smokeless tobacco: Never Used  Substance and Sexual Activity  . Alcohol use: No    Alcohol/week: 0.0 standard drinks  . Drug use: No  . Sexual activity: Not Currently    Partners: Male  Lifestyle  . Physical activity    Days per week: 4 days    Minutes per session: 40 min  . Stress: Not at all  Relationships  . Social Herbalist on phone: Three times a week    Gets together: Never    Attends religious service: More than 4 times per year    Active member of club or organization: Yes    Attends meetings of clubs or organizations: More than 4 times per year    Relationship status: Divorced  . Intimate partner violence    Fear of current or ex partner: No    Emotionally abused: No    Physically abused: No    Forced sexual activity: No  Other Topics Concern  . Not on file  Social History Narrative  . Not on file     Current Outpatient Medications:  .  aspirin EC 81 MG tablet, Take 1 tablet (81 mg total) by mouth daily., Disp: 30 tablet, Rfl: 0 .  atorvastatin (LIPITOR) 40 MG tablet, TAKE 1 TABLET BY MOUTH  DAILY, Disp: 90 tablet, Rfl: 1 .  Biotin 1 MG CAPS, Take 1 capsule by mouth daily., Disp: 30 capsule, Rfl: 0 .  buPROPion (WELLBUTRIN XL) 150 MG 24 hr tablet,  Take 1 tablet (150 mg total) by mouth daily., Disp: 30 tablet, Rfl: 0 .  Cholecalciferol (VITAMIN D) 2000 UNITS CAPS, Take 1 capsule by mouth daily., Disp: , Rfl:  .  clonazePAM (KLONOPIN) 0.5 MG tablet, Take 0.5 tablets (0.25 mg total) by mouth daily., Disp: 15 tablet, Rfl: 0 .  cyclobenzaprine (FLEXERIL) 5 MG tablet, TAKE 1 TABLET BY MOUTH  EVERY 8 HOURS AS NEEDED FOR MUSCLE SPASMS. DO NOT DRIVE FOR 8 HOURS, Disp: 90 tablet, Rfl: 1 .  diclofenac sodium (VOLTAREN) 1 % GEL, APP 2 GRAMS EXT AA QID, Disp: , Rfl: 6 .  DULoxetine (CYMBALTA) 60 MG capsule, Take 120 mg by mouth at bedtime. , Disp: , Rfl:  .  fluticasone (FLONASE) 50 MCG/ACT nasal spray, Place 2  sprays into both nostrils daily., Disp: 48 g, Rfl: 3 .  gabapentin (NEURONTIN) 300 MG capsule, Take 1 capsule by mouth at bedtime., Disp: , Rfl:  .  hydroxychloroquine (PLAQUENIL) 200 MG tablet, Take 200 mg by mouth 2 (two) times daily., Disp: , Rfl:  .  hydrOXYzine (ATARAX/VISTARIL) 10 MG tablet, Take 1 tablet (10 mg total) by mouth every 6 (six) hours as needed., Disp: 60 tablet, Rfl: 0 .  lamoTRIgine (LAMICTAL) 200 MG tablet, Take 1 tablet (200 mg total) by mouth 2 (two) times daily., Disp: 60 tablet, Rfl: 0 .  mycophenolate (MYFORTIC) 360 MG TBEC EC tablet, Take 360 mg by mouth 2 (two) times daily., Disp: , Rfl:  .  olopatadine (PATANOL) 0.1 % ophthalmic solution, Place 1 drop into both eyes 2 (two) times daily., Disp: 5 mL, Rfl: 3 .  omeprazole (PRILOSEC) 40 MG capsule, TAKE 1 CAPSULE BY MOUTH  DAILY, Disp: 90 capsule, Rfl: 1 .  Polyethylene Glycol 3350 (MIRALAX PO), Take by mouth., Disp: , Rfl:  .  telmisartan (MICARDIS) 40 MG tablet, Take 1 tablet (40 mg total) by mouth daily., Disp: 90 tablet, Rfl: 1 .  traZODone (DESYREL) 100 MG tablet, Take 200 mg by mouth at bedtime. , Disp: , Rfl:  .  triamcinolone ointment (KENALOG) 0.1 %, , Disp: , Rfl:  .  ketoconazole (NIZORAL) 2 % cream, Apply 1 application topically daily. (Patient not taking:  Reported on 03/18/2019), Disp: 15 g, Rfl: 0 .  meclizine (ANTIVERT) 25 MG tablet, Take 1 tablet (25 mg total) by mouth 3 (three) times daily as needed. (Patient not taking: Reported on 03/18/2019), Disp: 30 tablet, Rfl: 0 .  ondansetron (ZOFRAN) 4 MG tablet, TAKE 1 TABLET BY MOUTH  EVERY 8 HOURS AS NEEDED FOR NAUSEA AND VOMITING (Patient not taking: Reported on 03/18/2019), Disp: 60 tablet, Rfl: 0  Allergies  Allergen Reactions  . Ace Inhibitors     Other reaction(s): OTHER Pt states she can not take ace inhibitors. Pt states she can not remember her reaction.   . Bee Pollen   . Penicillins   . Pollen Extract     I personally reviewed active problem list, medication list, allergies, family history, social history with the patient/caregiver today.   ROS  Ten systems reviewed and is negative except as mentioned in HPI  She has chronic nausea, rash from psoriasis, recurrent dizziness, sob that is followed by pulmonologist but no changes   Objective  Virtual encounter, vitals not obtained.  There is no height or weight on file to calculate BMI.  Physical Exam  Awake, alert and oriented   PHQ2/9: Depression screen [facility] 2/9 04/19/2019 03/25/2019 02/07/2019 12/11/2018 11/20/2018  Decreased Interest 1 0 0 0 0  Down, Depressed, Hopeless 3 0 0 0 0  PHQ - 2 Score 4 0 0 0 0  Altered sleeping 2 0 0 0 0  Tired, decreased energy 2 0 0 0 1  Change in appetite 1 0 0 0 0  Feeling bad or failure about yourself  0 0 0 0 0  Trouble concentrating 2 0 0 0 0  Moving slowly or fidgety/restless 0 0 0 0 0  Suicidal thoughts 0 0 0 0 0  PHQ-9 Score 11 0 0 0 1  Difficult doing work/chores Somewhat difficult - Not difficult at all Not difficult at all Not difficult at all  Some recent data might be hidden   PHQ-2/9 Result is positive.    Fall Risk: Fall Risk  04/19/2019  03/25/2019 02/07/2019 12/11/2018 11/20/2018  Falls in the past year? 1 1 1 1 1   Number falls in past yr: 1 1 1 1 1   Comment - - - - -  Injury  with Fall? 0 1 0 0 0  Risk for fall due to : Impaired balance/gait History of fall(s) - - -  Risk for fall due to: Comment - - - - -  Follow up - - - - Falls evaluation completed    Assessment & Plan  1. Hemicrania  - predniSONE (DELTASONE) 10 MG tablet; Take 1 tablet (10 mg total) by mouth 2 (two) times daily with a meal.  Dispense: 10 tablet; Refill: 0 Discussed results of MRI, not sure if referral to neurosurgeon is appropriate at this time , and we will place referral to neurologist if symptoms do not improve  2. Vertigo  - predniSONE (DELTASONE) 10 MG tablet; Take 1 tablet (10 mg total) by mouth 2 (two) times daily with a meal.  Dispense: 10 tablet; Refill: 0  3. Chronic recurrent major depressive disorder [facility])  She has a follow up with psychiatrist in 11 days and has a counselor appointment on Monday, she has been snappy and not feeling well, denies suicidal thoughts or ideation. Discussed being patient with herself   I discussed the assessment and treatment plan with the patient. The patient was provided an opportunity to ask questions and all were answered. The patient agreed with the plan and demonstrated an understanding of the instructions.  The patient was advised to call back or seek an in-person evaluation if the symptoms worsen or if the condition fails to improve as anticipated.  4. Chronic nausea  - ondansetron (ZOFRAN) 4 MG tablet; TAKE 1 TABLET BY MOUTH  EVERY 8 HOURS AS NEEDED FOR NAUSEA AND VOMITING  Dispense: 60 tablet; Refill: 0  I provided 25  minutes of non-face-to-face time during this encounter.

## 2019-04-26 ENCOUNTER — Telehealth: Payer: Self-pay

## 2019-04-26 ENCOUNTER — Ambulatory Visit: Payer: Self-pay

## 2019-04-26 NOTE — Chronic Care Management (AMB) (Signed)
   Chronic Care Management   Unsuccessful Call Note 04/26/2019 Name: Nicole Ballard MRN: [id number] DOB: 1956-08-10  Nicole Ballard is a 63 year old female patient of Dr. Steele Sizer, who was referred to the CCM Team by her health plan. RN CM met with patient via telephone to complete health assessment and discuss patients health goals. Today, I followed up with Nicole Ballard to discuss progression towards those goals.   Was unable to reach patient via telephone today for follow up. I have left HIPAA compliant voicemail asking patient to return my call. (unsuccessful outreach #1).   Plan: Will await patient's return call.     Rodert Hinch E. Rollene Rotunda, RN, BSN Nurse Care Coordinator [facility] Care Management  [phone]

## 2019-04-30 ENCOUNTER — Encounter
Admit: 2019-04-30 | Discharge: 2019-05-01 | Payer: MEDICARE | Attending: Student in an Organized Health Care Education/Training Program | Primary: Student in an Organized Health Care Education/Training Program

## 2019-04-30 DIAGNOSIS — G4700 Insomnia, unspecified: Secondary | ICD-10-CM | POA: Diagnosis not present

## 2019-04-30 DIAGNOSIS — Z5181 Encounter for therapeutic drug level monitoring: Secondary | ICD-10-CM | POA: Diagnosis not present

## 2019-04-30 DIAGNOSIS — R413 Other amnesia: Secondary | ICD-10-CM

## 2019-04-30 DIAGNOSIS — F3341 Major depressive disorder, recurrent, in partial remission: Secondary | ICD-10-CM

## 2019-04-30 DIAGNOSIS — F411 Generalized anxiety disorder: Principal | ICD-10-CM

## 2019-04-30 NOTE — Unmapped (Signed)
[facility]  Psychiatry Telehealth Encounter  Established Patient     Encounter Description/Consent: This encounter was conducted from home via telephone with the patient. Caitlin Smith was located in her apartment. Discussed the choice to participate in care through the use of telepsychiatry by telephone service. Telepsychiatry enables health care providers at different locations to provide safe, effective, and convenient care through the use of technology. As with any health care service, there are risks associated with the use of telepsychiatry, including lack of visualization and that there may be instances were they need to come to clinic to complete the assessment. Patient verbally understands the risks and benefits of telepsychaitry as explained. All questions regarding telepsychiatry answered.    I notified her that because this is a special type of visit, she may get a bill for a copay or coinsurance. She is OK with proceeding.    Time Spent: 50 minutes    Assessment:  Caitlin Smith is a 63 y.o. female with a history of MDD, GAD, and Panic Disorder in the context of many chronic medical conditions including chronic pain/fibromyalgia, osteoarthritis with degenerative disc disease, Lupus, pituitary tumor (benign), s/p right total knee arthroplasty, and VIN II (s/p resection), who presents for follow-up evaluation. She has been maintained on Lamictal for many years as well as cymbalta, trazodone, klonopin, and wellbutrin. Patient has previously tolerated a slight taper in her klonopin from 0.75 mg to 0.25 mg qHS overtime. Her mood is often exacerbated by steroid use for current treatment of pulmonary disease. Patient is engaged with CBT therapy at the pain clinic.    Met Caitlin Smith today for phone visit.  She reports worsening anxiety and relative stability of depression; as in prior meetings, she attributes her worsened anxiety to the current world situation. She continues to stay social with her family and connects virtually with her church. She continues to have insomnia.  Discussed again getting melatonin, and also decided to discontinue wellbutrin 150mg .  Made this change in an effort to diminish activating medications and polypharmacy. Caitlin Smith reports that she will call clinicians and her children should she feel overwhelmed by her stress and anxiety or have any thoughts of SI, which she denies at today's visit.  Will follow-up in about a month.     Risk Assessment:  A full suicide and violence risk assessment was performed as part of this patient's initial evaluation with [facility] outpatient psychiatry.  There is no new acute risk for suicide or violence at this time. The patient was educated about relevant modifiable risk factors including following recommendations for treatment of psychiatric illness and abstaining from substance abuse.         While future psychiatric events cannot be accurately predicted, the patient does not currently require acute inpatient psychiatric care and does not currently meet [facility] involuntary commitment criteria.     Plan:      Plan:  # Depression - Anxiety - Panic Disorder  - Continue Lamictal 400mg  qhs for mood  - Continue Cymbalta 120mg  qAM to target depressive sx and chronic pain  - Discontinue Wellbutrin XL to 150mg  qAM (dec 12/31 ) given polypharmacy and insomnia  - Continue Clonazepam 0.25mg  qHS, appropriate filling per NCCSRS  - Continue CBT at pain clinic    # Insomnia: Last sleep study performed in 2017. Per chart review, showed mild OSA likely related to medication use. Neurology recommends patient avoid sleeping on back as she likely has OSA when sleeping in  this position  - Continue Trazodone 200mg  qHS for insomnia. May plan to discontinue in future if insomnia improves after discontinuation of Wellbutrin and initiation of melatonin.   - Discontinue Wellbutrin per above  - Clonazepam per above  - Patient provided with sleep hygiene worksheet 09/2018; discussed again 10/23/18    # Mild Neurocognitive Impairment: Patient seen by Neurology ([facility]) 05/22/18 who dx pt with mild neurocognitive impairment w/ deficits in inattention and visual-spatial function with suspicion that sleep apnea and polypharmacy may be contributing to her difficulties. Recommended that psychopharm regimen be simplified, including eliminating trazodone and considering changing Wellbutrin to Zoloft or Lexapro for anxiety and eliminating Klonopin when stable.  - Plan for discontinuation of Wellbutrin per above  - Will consider taper/discontinuation of Trazodone vs Klonopin next if sleep improves after above changes  - However, it is very likely that patient's lupus is significantly contributing to cognitive decline, which will limit improvement she will see with reduction in polypharmacy  --Last MOCA 22 on 12/12/2017    # Medical Monitoring - High Risk Medication Use  - The complexity of this patient's care involves drug therapy which requires intensive monitoring for toxicity. This is in part due to a narrow therapeutic window, as well as potential for toxicity. This patient is being treated with Lamotrigine (Lamictal) to target their psychiatric disorder, Major depressive disorder, refractory. In regards to this medication being considered high risk, Lamotrigine (lamictal) has black box warning for serious rash including fatal reaction of Stevens-Johnson syndrome and rare cases of toxic epidermal necrolysis. In addition to prolonged titration to mitigate risk of serious rash, lamotrigine requires monitoring of hepatic and renal function at baseline, and at times will require monitoring of lamotrigine blood levels.      AST   Date Value Ref Range Status   09/20/2018 27 14 - 38 U/L Final   11/05/2014 32 14 - 38 U/L Final     ALT   Date Value Ref Range Status   09/20/2018 15 <35 U/L Final   11/05/2014 21 15 - 48 U/L Final     Platelet   Date Value Ref Range Status 09/20/2018 369 150 - 440 10*9/L Final   11/05/2014 304 150 - 440 10*9/L Final      Lamotrigine Monitoring  - Hepatic function testing, platelets (via CBC) q6 months   - Last obtained 09/2018   - Due 02/2019; patient has not yet obtained given covid-19    # Return to Clinic: 9/29 via phone    Psychotherapy:  No billable psychotherapy service provided but brief supportive therapy was utilized.    Patient has been given this writer's contact information as well as the [facility] Psychiatry urgent line number. The patient has been instructed to call 911 for emergencies.    Patient and plan of care were discussed with the Attending MD,Dr. Gunnar Bulla, who agrees with the above statement and plan.     Connye Burkitt MD    Subjective:     Psychiatric Chief Concern:  Follow-up psychiatric evaluation for depression, anxiety and insomnia.     Interval History:     Spoke with patient via phone. She reports continued, increased anxiety given covid.  She denies depressed mood and states that anxiety and insomnia are her most prominent concerns.  She reports that she has not yet tried melatonin.  Discussed obtaining this and provided explicit instructions on where to find and what dose to use.  Discussed stopping wellbutrin as this has been a  consideration for some time.  She says that she would be willing to try it.  Discussed how we hope to decrease any potentially activating mediations, both in service of sleep and to better anxiety. She reports staying engaged with her children as well as virtually with her church. She states that her noisy parrot (her depression speaking) hasn't been too active.  She contracts for safety, stating she will always reach out should she feel SI.  She denies SI at this visit.      Social History: reviewed; pertinents have been documented in the interval history section.    ROS:  As per Interval History and:  Constitutional:  no significant appetite change, +fatigue, +anxiety  Neuro:  none / negative Objective:    Mental Status Exam: Completed 04/30/19 and similar to prior  Speech/Language:    Normal rate, volume, tone, fluency and Language intact, well formed   Mood:   Anxious   Thought process and Associations:   Logical, linear, clear, coherent, goal directed   Abnormal/psychotic thought content:     Denies SI, HI, self harm, delusions, obsessions, paranoid ideation, or ideas of reference   Perceptual disturbances:     Does not endorse auditory or visual hallucinations     Orientation:   Oriented to person, place, time, and general circumstances   Insight:     Intact   Judgment:    Intact   Impulse Control:   Intact     Medications: reviewed at today's visit    Psychometrics:  Psych Scale Scores - Adult      Office Visit from 09/04/2018 in [facility] PSYCHIATRY Transplant AT [facility]   **PHQ-9: Severity Measure for DEPRESSION Total Score**  20 Collected on 09/04/2018 0000   WHODAS 2.0 (Self-administered) - Total Score  42 Collected on 09/04/2018 0000          Posey Rea, MD

## 2019-05-01 ENCOUNTER — Other Ambulatory Visit: Payer: Self-pay

## 2019-05-01 NOTE — Patient Outreach (Signed)
[place] [facility]) Care Management  05/01/2019  Dietrich 1956/08/22 [id number]   Medication Adherence call to Mrs. Nicole Ballard [place] Identifiers Verify spoke with patient she is past due on Atorvastatin 40 mg patient explain she takes 1 tablet daily an is expecting an order from [facility] any time now.Mrs. Seiple is showing past due under [place].   [place] Management Direct Dial [phone]  Fax [phone] [email or web address]

## 2019-05-07 NOTE — Unmapped (Signed)
[facility] Clinical Assessment & Refill Coordination Note    Caitlin Smith, DOB: November 30, 1955  Phone: [phone] (home)     All above HIPAA information was verified with patient.     Specialty Medication(s):   Inflammatory Disorders: mycophenolate     Current Outpatient Medications   Medication Sig Dispense Refill   ??? amLODIPine (NORVASC) 5 MG tablet Take 1 tablet (5 mg total) by mouth daily. 90 tablet 3   ??? aspirin (ECOTRIN) 81 MG tablet Take 81 mg by mouth daily.     ??? atorvastatin (LIPITOR) 40 MG tablet Take 40 mg by mouth daily.     ??? benzonatate (TESSALON) 200 MG capsule Take 200 mg by mouth Three (3) times a day as needed for cough.     ??? biotin 1 mg cap Take by mouth.     ??? buPROPion (WELLBUTRIN XL) 150 MG 24 hr tablet Take 150 mg by mouth.     ??? calcipotriene (DOVONOX) 0.005 % ointment Apply every other day to areas of rash in folds as maintenance 60 g 2   ??? clobetasoL (TEMOVATE) 0.05 % ointment Apply BID to all affected area 60 g 5   ??? clonazePAM (KLONOPIN) 0.5 MG tablet Take 0.5 tablets (0.25 mg total) by mouth nightly. 15 tablet 2   ??? cyclobenzaprine (FLEXERIL) 5 MG tablet Take 1 tablet (5 mg total) by mouth two (2) times a day as needed. 60 tablet 2   ??? diclofenac sodium (VOLTAREN) 1 % gel Apply 4 g topically Four (4) times a day. 300 g 5   ??? dicyclomine (BENTYL) 20 mg tablet Take 20 mg by mouth every six (6) hours.     ??? DULoxetine (CYMBALTA) 60 MG capsule Take 2 capsules (120 mg total) by mouth daily. 180 capsule 2   ??? fluticasone propionate (FLONASE) 50 mcg/actuation nasal spray      ??? fluticasone propionate (FLOVENT HFA) 110 mcg/actuation inhaler Inhale 2 puffs Two (2) times a day. 1 Inhaler 2   ??? gabapentin (NEURONTIN) 300 MG capsule Take 1 capsule (300 mg total) by mouth Three (3) times a day. 90 capsule 2   ??? halobetasol (ULTRAVATE) 0.05 % ointment Apply topically Two (2) times a day. 50 g 1   ??? hydroxychloroquine (PLAQUENIL) 200 mg tablet Take 1 tablet (200 mg total) by mouth Two (2) times a day. 180 tablet 3   ??? hydrOXYzine (ATARAX) 10 MG tablet      ??? inhalational spacing device (AEROCHAMBER MV) Spcr 1 each by Miscellaneous route two (2) times a day. With Fovent 1 each 0   ??? ketoconazole (NIZORAL) 2 % cream APP EXT AA D  0   ??? lamoTRIgine (LAMICTAL) 200 MG tablet Take 1 tablet (200 mg total) by mouth Two (2) times a day. 180 tablet 2   ??? meclizine (ANTIVERT) 25 mg tablet Take 25 mg by mouth Three (3) times a day as needed.      ??? mupirocin (BACTROBAN) 2 % ointment APP EXT IEN BID  0   ??? mycophenolate (MYFORTIC) 360 MG TbEC TAKE 1 TABLET BY MOUTH TWICE DAILY 180 each 3   ??? olopatadine (PATANOL) 0.1 % ophthalmic solution Apply to eye.     ??? omeprazole (PRILOSEC) 40 MG capsule Take 40 mg by mouth daily.      ??? polyethylene glycol (GLYCOLAX) 17 gram/dose powder 2 cap fulls in a full glass of water, three times a day, for 5 days.     ???  telmisartan (MICARDIS) 40 MG tablet Take by mouth two (2) times a day.      ??? traZODone (DESYREL) 100 MG tablet Take 2 tablets (200 mg total) by mouth nightly. 180 tablet 2   ??? triamcinolone (KENALOG) 0.1 % ointment Apply to rash when itchy as needed, few times per week 454 g 0     No current facility-administered medications for this visit.         Changes to medications: Caitlin Smith reports no changes at this time.    Allergies   Allergen Reactions   ??? Vilazodone Swelling   ??? Ace Inhibitors Other (See Comments)     Pt states she can not take ace inhibitors. Pt states she can not remember her reaction.    ??? Bee Pollen    ??? Pollen Extracts    ??? Penicillin G Rash   ??? Penicillins Rash       Changes to allergies: No    SPECIALTY MEDICATION ADHERENCE     mycophenolate 360 mg: 14 days of medicine on hand     Medication Adherence    Patient reported X missed doses in the last month: 0  Specialty Medication: mycophenolate 360mg           Specialty medication(s) dose(s) confirmed: Regimen is correct and unchanged.     Are there any concerns with adherence? No    Adherence counseling provided? Not needed    CLINICAL MANAGEMENT AND INTERVENTION      Clinical Benefit Assessment:    Do you feel the medicine is effective or helping your condition? Yes    Clinical Benefit counseling provided? Not needed    Adverse Effects Assessment:    Are you experiencing any side effects? No    Are you experiencing difficulty administering your medicine? No    Quality of Life Assessment:    How many days over the past month did your SLE keep you from your normal activities? For example, brushing your teeth or getting up in the morning. Patient declined to answer    Have you discussed this with your provider? Not needed    Therapy Appropriateness:    Is therapy appropriate? Yes, therapy is appropriate and should be continued    DISEASE/MEDICATION-SPECIFIC INFORMATION      N/A    PATIENT SPECIFIC NEEDS     ? Does the patient have any physical, cognitive, or cultural barriers? No    ? Is the patient high risk? Yes, patient taking a REMS drug     ? Does the patient require a Care Management Plan? No     ? Does the patient require physician intervention or other additional services (i.e. nutrition, smoking cessation, social work)? No      SHIPPING     Specialty Medication(s) to be Shipped:   Inflammatory Disorders: mycophenolate 360mg     Other medication(s) to be shipped: none       Changes to insurance: No    Delivery Scheduled: Yes, Expected medication delivery date: 05/16/2019.     Medication will be delivered via Next Day Courier to the confirmed home address in [facility].    The patient will receive a drug information handout for each medication shipped and additional FDA Medication Guides as required.  Verified that patient has previously received a Conservation officer, historic buildings.    All of the patient's questions and concerns have been addressed.    [name] [name]   [facility] Specialty Pharmacist

## 2019-05-08 ENCOUNTER — Encounter: Admit: 2019-05-08 | Discharge: 2019-05-09 | Payer: MEDICARE | Attending: Dermatology | Primary: Dermatology

## 2019-05-08 DIAGNOSIS — L409 Psoriasis, unspecified: Secondary | ICD-10-CM | POA: Diagnosis not present

## 2019-05-08 MED ORDER — HALOBETASOL PROPIONATE 0.05 % TOPICAL OINTMENT
Freq: Two times a day (BID) | TOPICAL | 1 refills | 0.00000 days | Status: CP
Start: 2019-05-08 — End: 2020-05-07

## 2019-05-08 NOTE — Unmapped (Signed)
DERMATOLOGY FOLLOW-UP NOTE    Assessment and Plan:    Inverse psoriasis - now well controlled w/ topical steroids  - Biopsy c/w psoriasis  - Would continue try to avoid prednisone in the future if possible as this can flare psoriasis.   - Will continue to treat with topicals below.   - cont halobetasol ointment prn  -start vaseline  - Consider adding an oral agent if she continues to flare at next visit- could consider [facility] [place] (although has mood d/o).   - Of note, she is not a good candidate for MTX given history of eosinophilic pneumonitis- rheumatology and pulmonology agree that this would not be a suitable option for her given her pulmonary history.      Diffuse Pruritus -  Denies new medication. Will perform systemic screening labs as below. Denies new medications however has multiple comorbidities and polypharmacy.  -Ok to use tac ointment sparingly. Thyroid, lfts, cbc, kidney fx unremarkable for internal etiology  -dry skin care handout reviewed        RTC prn  ______________________________________________________________________    CC:    Chief Complaint   Patient presents with   ??? Eczema     sx's improving         HPI:  This is a pleasant 63 y.o. female last seen by Dr Orma Flaming on 2/20 who presents today for  Psoriasis. Since she was last seen it is much improved with the halobetasol. Only using sparingly now since she is better. Also notes that her itching is improved with good moisturizing and dry skin habits. Denies other new, changing, non-healing, tender, or bleeding lesions.  No other skin concerns.        Final Diagnosis   Date Value Ref Range Status   10/15/2018   Final    Back, punch  - Psoriasis           Pertinent PMH:  Reviewed in Epic  SLE   Eosinophilic pneumonitis   Inverse Psoriasis   She was initially diagnosed in 2006 with SLE presenting with episcleritis and arthralgia with serology showing +ANA, +dsDNA. ??She also has stage I CKD felt secondary not due to SLE but due to HTN and obesity. ROS: Baseline state of health.  Denies fevers, chills. No other skin complaints except noted per HPI.     PE:  Gen: WD, WN, NAD, A&O  Skin: Per patient request, examination of the head, neck, chest, back, abdomen,buttocks, bilateral upper extremities,and bilateral lower extremities was performed and significant for the below. All other areas examined were normal or had no significant findings.   Skin clear

## 2019-05-15 MED FILL — MYCOPHENOLATE SODIUM 360 MG TABLET,DELAYED RELEASE: ORAL | 90 days supply | Qty: 180 | Fill #2

## 2019-05-15 MED FILL — MYCOPHENOLATE SODIUM 360 MG TABLET,DELAYED RELEASE: 90 days supply | Qty: 180 | Fill #2 | Status: AC

## 2019-05-16 ENCOUNTER — Other Ambulatory Visit: Payer: Self-pay

## 2019-05-16 NOTE — Patient Outreach (Signed)
[facility]) Care Management  05/16/2019  [place] 11-18-1955 [id number]   Medication Adherence call to Mrs. [place] Compliant Voice message left with a call back number. Ms. Cage is showing past due on Atorvastatin 40 mg under [place].   [place] Management Direct Dial [phone]  Fax [phone] [email or web address]

## 2019-05-17 ENCOUNTER — Other Ambulatory Visit: Payer: Self-pay | Admitting: Family Medicine

## 2019-05-17 DIAGNOSIS — E1169 Type 2 diabetes mellitus with other specified complication: Secondary | ICD-10-CM

## 2019-05-17 DIAGNOSIS — E669 Obesity, unspecified: Secondary | ICD-10-CM

## 2019-05-21 ENCOUNTER — Institutional Professional Consult (permissible substitution): Admit: 2019-05-21 | Discharge: 2019-05-22 | Payer: MEDICARE | Attending: Psychologist | Primary: Psychologist

## 2019-05-21 DIAGNOSIS — F4310 Post-traumatic stress disorder, unspecified: Secondary | ICD-10-CM

## 2019-05-21 DIAGNOSIS — F331 Major depressive disorder, recurrent, moderate: Secondary | ICD-10-CM

## 2019-05-21 DIAGNOSIS — F410 Panic disorder [episodic paroxysmal anxiety] without agoraphobia: Secondary | ICD-10-CM

## 2019-05-21 DIAGNOSIS — F411 Generalized anxiety disorder: Secondary | ICD-10-CM

## 2019-05-21 NOTE — Unmapped (Signed)
Medication refill request.
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
## 2019-05-22 ENCOUNTER — Encounter: Admit: 2019-05-22 | Discharge: 2019-05-23 | Payer: MEDICARE

## 2019-05-22 DIAGNOSIS — M329 Systemic lupus erythematosus, unspecified: Secondary | ICD-10-CM | POA: Diagnosis not present

## 2019-05-22 MED ORDER — BUPROPION HCL XL 150 MG 24 HR TABLET, EXTENDED RELEASE
ORAL_TABLET | Freq: Every morning | ORAL | 0 refills | 90 days | Status: CP
Start: 2019-05-22 — End: 2019-06-06
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
## 2019-05-28 ENCOUNTER — Other Ambulatory Visit: Payer: Self-pay | Admitting: Family Medicine

## 2019-05-28 DIAGNOSIS — K219 Gastro-esophageal reflux disease without esophagitis: Secondary | ICD-10-CM

## 2019-05-28 DIAGNOSIS — I10 Essential (primary) hypertension: Secondary | ICD-10-CM

## 2019-05-28 DIAGNOSIS — M26621 Arthralgia of right temporomandibular joint: Secondary | ICD-10-CM

## 2019-05-28 DIAGNOSIS — S161XXA Strain of muscle, fascia and tendon at neck level, initial encounter: Secondary | ICD-10-CM

## 2019-05-28 NOTE — Telephone Encounter (Signed)
Requested medication (s) are due for refill today: yes  Requested medication (s) are on the active medication list: yes    Future visit scheduled: yes  Notes to clinic:  Review for refill Requesting 1 year supply    Requested Prescriptions  Pending Prescriptions Disp Refills   atorvastatin (LIPITOR) 40 MG tablet [Pharmacy Med Name: ATORVASTATIN  40MG   TAB] 90 tablet 3    Sig: TAKE 1 TABLET BY MOUTH  DAILY     Cardiovascular:  Antilipid - Statins Passed - 05/28/2019 11:28 AM      Passed - Total Cholesterol in normal range and within 360 days    Cholesterol, Total  Date Value Ref Range Status  05/06/2015 178 100 - 199 mg/dL Final   Cholesterol  Date Value Ref Range Status  02/07/2019 143 <200 mg/dL Final         Passed - LDL in normal range and within 360 days    LDL Cholesterol (Calc)  Date Value Ref Range Status  02/07/2019 69 mg/dL (calc) Final    Comment:    Reference range: <100 . Desirable range <100 mg/dL for primary prevention;   <70 mg/dL for patients with CHD or diabetic patients  with > or = 2 CHD risk factors. Marland Kitchen LDL-C is now calculated using the Martin-Hopkins  calculation, which is a validated novel method providing  better accuracy than the Friedewald equation in the  estimation of LDL-C.  Cresenciano Genre et al. Annamaria Helling. [id number]): 2061-2068  (http://education.[facility].com/faq/FAQ164)          Passed - HDL in normal range and within 360 days    HDL  Date Value Ref Range Status  02/07/2019 63 > OR = 50 mg/dL Final  05/06/2015 56 >39 mg/dL Final    Comment:    According to ATP-III Guidelines, HDL-C >59 mg/dL is considered a negative risk factor for CHD.          Passed - Triglycerides in normal range and within 360 days    Triglycerides  Date Value Ref Range Status  02/07/2019 39 <150 mg/dL Final         Passed - Patient is not pregnant      Passed - Valid encounter within last 12 months    Recent Outpatient Visits          1 month ago  [facility] Steele Sizer, MD   2 months ago Generalized pruritus   [facility] Steele Sizer, MD   3 months ago Benign hypertension   [facility] Steele Sizer, MD   4 months ago Chest discomfort   [facility] [place], Drue Stager, MD   5 months ago Perennial allergic rhinitis with seasonal variation   [facility] Steele Sizer, MD      Future Appointments            In 1 week Steele Sizer, MD [facility]            omeprazole (PRILOSEC) 40 MG capsule [Pharmacy Med Name: OMEPRAZOLE  40MG   CAP] 90 capsule 3    Sig: TAKE 1 CAPSULE BY MOUTH  DAILY     Gastroenterology: Proton Pump Inhibitors Passed - 05/28/2019 11:28 AM      Passed - Valid encounter within last 12 months    Recent Outpatient Visits          1 month ago [place],  Drue Stager, MD   2 months ago Generalized pruritus   [facility] Steele Sizer, MD   3 months ago Benign hypertension   [facility] Steele Sizer, MD   4 months ago Chest discomfort   [facility] [place], Drue Stager, MD   5 months ago Perennial allergic rhinitis with seasonal variation   [facility] Steele Sizer, MD      Future Appointments            In 1 week Steele Sizer, MD [facility]            amLODipine (NORVASC) 2.5 MG tablet [Pharmacy Med Name: AMLODIPINE  2.5MG   TAB] 90 tablet 3    Sig: TAKE 1 TABLET BY MOUTH  DAILY     Cardiovascular:  Calcium Channel Blockers Passed - 05/28/2019 11:28 AM      Passed - Last BP in normal range    BP Readings from Last 1 Encounters:  02/07/19 128/68         Passed - Valid encounter within last 6 months    Recent Outpatient Visits          1 month ago [facility] Steele Sizer, MD   2 months ago Generalized pruritus   [facility] Steele Sizer, MD   3 months ago Benign hypertension   [facility] Steele Sizer, MD   4 months ago Chest discomfort   [facility] [place], Drue Stager, MD   5 months ago Perennial allergic rhinitis with seasonal variation   [facility] Steele Sizer, MD      Future Appointments            In 1 week Steele Sizer, MD [facility]            cyclobenzaprine (FLEXERIL) 5 MG tablet [Pharmacy Med Name: CYCLOBENZAPRINE  5MG   TAB] 90 tablet 1    Sig: TAKE 1 TABLET BY MOUTH  EVERY 8 HOURS AS NEEDED FOR MUSCLE SPASMS. DO NOT DRIVE FOR 8 HOURS     Not Delegated - Analgesics:  Muscle Relaxants Failed - 05/28/2019 11:28 AM      Failed - This refill cannot be delegated      Passed - Valid encounter within last 6 months    Recent Outpatient Visits          1 month ago [facility] Steele Sizer, MD   2 months ago Generalized pruritus   [facility] Steele Sizer, MD   3 months ago Benign hypertension   [facility] Steele Sizer, MD   4 months ago Chest discomfort   [facility], Drue Stager, MD   5 months ago Perennial allergic rhinitis with seasonal variation   [facility] Steele Sizer, MD      Future Appointments            In 1 week Steele Sizer, MD [facility], [facility]

## 2019-05-31 NOTE — Unmapped (Signed)
Patient wanted [facility]

## 2019-06-04 ENCOUNTER — Encounter
Admit: 2019-06-04 | Discharge: 2019-06-05 | Payer: MEDICARE | Attending: Student in an Organized Health Care Education/Training Program | Primary: Student in an Organized Health Care Education/Training Program

## 2019-06-04 DIAGNOSIS — F3342 Major depressive disorder, recurrent, in full remission: Secondary | ICD-10-CM

## 2019-06-04 DIAGNOSIS — F411 Generalized anxiety disorder: Secondary | ICD-10-CM

## 2019-06-04 DIAGNOSIS — G4700 Insomnia, unspecified: Secondary | ICD-10-CM

## 2019-06-04 NOTE — Unmapped (Signed)
[facility]  Psychiatry Telehealth Encounter  Established Patient     Encounter Description/Consent: This encounter was conducted from home via telephone with the patient. Caitlin Smith was located in her apartment. Discussed the choice to participate in care through the use of telepsychiatry by telephone service. Telepsychiatry enables health care providers at different locations to provide safe, effective, and convenient care through the use of technology. As with any health care service, there are risks associated with the use of telepsychiatry, including lack of visualization and that there may be instances were they need to come to clinic to complete the assessment. Patient verbally understands the risks and benefits of telepsychaitry as explained. All questions regarding telepsychiatry answered.    I notified her that because this is a special type of visit, she may get a bill for a copay or coinsurance. She is OK with proceeding.    Time Spent: 30 minutes    Assessment:  Caitlin Smith is a 63 y.o. female with a history of MDD, GAD, and Panic Disorder in the context of many chronic medical conditions including chronic pain/fibromyalgia, osteoarthritis with degenerative disc disease, Lupus, pituitary tumor (benign), s/p right total knee arthroplasty, and VIN II (s/p resection), who presents for follow-up evaluation. She has been maintained on Lamictal for many years as well as cymbalta, trazodone, klonopin, and wellbutrin. Patient has previously tolerated a slight taper in her klonopin from 0.75 mg to 0.25 mg qHS overtime. Her mood is often exacerbated by steroid use for current treatment of pulmonary disease. Patient is engaged with CBT therapy at the pain clinic.    Met Caitlin Smith today 06/04/19 for phone visit.  She reports continuing anxiety and relative stability of depression; as in prior meetings, she attributes her worsened anxiety to the current world situation.  We have the opportunity to discuss political issues at hand and she shares with me the adversity she has faced due to her race in this country.  We discuss looking for resources such as group meetings or literature about this. She continues to stay social with her family and connects virtually with her church. Given her current mood and feeling that it is helpful, will continue wellbutrin 150mg  at this time. Caitlin Smith reports that she will call clinicians and her children should she feel overwhelmed by her stress and anxiety or have any thoughts of SI, which she denies at today's visit.  Will follow-up in about a month.     Risk Assessment:  A full suicide and violence risk assessment was performed as part of this patient's initial evaluation with [facility] outpatient psychiatry.  There is no new acute risk for suicide or violence at this time. The patient was educated about relevant modifiable risk factors including following recommendations for treatment of psychiatric illness and abstaining from substance abuse.         While future psychiatric events cannot be accurately predicted, the patient does not currently require acute inpatient psychiatric care and does not currently meet [facility] involuntary commitment criteria.     Plan:  # Depression - Anxiety - Panic Disorder  - Continue Lamictal 400mg  qhs for mood  - Continue Cymbalta 120mg  qAM to target depressive sx and chronic pain  - Continue Wellbutrin XL 150mg  qAM   - Continue Clonazepam 0.25mg  qHS, appropriate filling per NCCS[facility]  - Continue CBT at pain clinic    # Insomnia: Last sleep study performed in 2017. Per chart review, showed mild OSA likely related to  medication use. Neurology recommends patient avoid sleeping on back as she likely has OSA when sleeping in this position  - Continue Trazodone 200mg  qHS for insomnia. May plan to discontinue in future if insomnia improves after discontinuation of Wellbutrin and initiation of melatonin.   - Discontinue Wellbutrin per above  - Clonazepam per above  - Patient provided with sleep hygiene worksheet 09/2018; discussed again 10/23/18    # Mild Neurocognitive Impairment: Patient seen by Neurology ([facility]) 05/22/18 who dx pt with mild neurocognitive impairment w/ deficits in inattention and visual-spatial function with suspicion that sleep apnea and polypharmacy may be contributing to her difficulties. Recommended that psychopharm regimen be simplified, including eliminating trazodone and considering changing Wellbutrin to Zoloft or Lexapro for anxiety and eliminating Klonopin when stable. As of 9/29, we have not been able to decrease clonazepam or discontinue wellbutrin. Pandemic and social atmosphere make changes in pharm more challenging at this time; will likely defer until things are more settled  - Plan for discontinuation of Wellbutrin per above  - Will consider taper/discontinuation of Trazodone vs Klonopin next if sleep improves after above changes  - However, it is very likely that patient's lupus is significantly contributing to cognitive decline, which will limit improvement she will see with reduction in polypharmacy  --Last MOCA 22 on 12/12/2017    # Medical Monitoring - High Risk Medication Use  - The complexity of this patient's care involves drug therapy which requires intensive monitoring for toxicity. This is in part due to a narrow therapeutic window, as well as potential for toxicity. This patient is being treated with Lamotrigine (Lamictal) to target their psychiatric disorder, Major depressive disorder, refractory. In regards to this medication being considered high risk, Lamotrigine (lamictal) has black box warning for serious rash including fatal reaction of Stevens-Johnson syndrome and rare cases of toxic epidermal necrolysis. In addition to prolonged titration to mitigate risk of serious rash, lamotrigine requires monitoring of hepatic and renal function at baseline, and at times will require monitoring of lamotrigine blood levels.      AST   Date Value Ref Range Status   09/20/2018 27 14 - 38 U/L Final   11/05/2014 32 14 - 38 U/L Final     ALT   Date Value Ref Range Status   09/20/2018 15 <35 U/L Final   11/05/2014 21 15 - 48 U/L Final     Platelet   Date Value Ref Range Status   09/20/2018 369 150 - 440 10*9/L Final   11/05/2014 304 150 - 440 10*9/L Final      Lamotrigine Monitoring  - Hepatic function testing, platelets (via CBC) q6 months   - Last obtained 09/2018   - Due 02/2019; patient has not yet obtained given covid-19    # Return to Clinic: 9/29 via phone    Psychotherapy:  No billable psychotherapy service provided but brief supportive therapy was utilized.    Patient has been given this writer's contact information as well as the [facility] Psychiatry urgent line number. The patient has been instructed to call 911 for emergencies.    Patient and plan of care were discussed with the Attending MD,Dr. Cecilio Asper, who agrees with the above statement and plan.     Connye Burkitt MD    Subjective:     Psychiatric Chief Concern:  Follow-up psychiatric evaluation for depression, anxiety and insomnia.     Interval History: Ms. Montejano says that she is feeling a little better lately.  She had  messaged me about worsening anxiety a couple weeks prior and she says that this is mostly resolved.  We talk at length about the stressors of the current political situation as well as the challenges she has faced as a woman of color. We agree that this is not going to be helped by medication.  We discuss looking into groups/meeting that discuss and work against racism as well as literature on the subject. Discuss continuing medication regimen as is because she feels like it works well for now.     Social History: reviewed; pertinents have been documented in the interval history section.    ROS:  As per Interval History and:  Constitutional:  no significant appetite change, +fatigue, +anxiety  Neuro:  none / negative    Objective: Mental Status Exam: Completed 06/04/19 and similar to prior  Speech/Language:    Normal rate, volume, tone, fluency and Language intact, well formed   Mood:   Anxious   Thought process and Associations:   Logical, linear, clear, coherent, goal directed   Abnormal/psychotic thought content:     Denies SI, HI, self harm, delusions, obsessions, paranoid ideation, or ideas of reference   Perceptual disturbances:     Does not endorse auditory or visual hallucinations     Orientation:   Oriented to person, place, time, and general circumstances   Insight:     Intact   Judgment:    Intact   Impulse Control:   Intact     Medications: reviewed at today's visit    Psychometrics:  Psych Scale Scores - Adult      Office Visit from 09/04/2018 in [facility] PSY[facility]IATRY Transplant AT [facility]   **PHQ-9: Severity Measure for DEPRESSION Total Score**  20 Collected on 09/04/2018 0000   WHODAS 2.0 (Self-administered) - Total Score  42 Collected on 09/04/2018 0000          Posey Rea, MD

## 2019-06-06 DIAGNOSIS — J45909 Unspecified asthma, uncomplicated: Secondary | ICD-10-CM

## 2019-06-06 MED ORDER — BUPROPION HCL XL 150 MG 24 HR TABLET, EXTENDED RELEASE
ORAL_TABLET | Freq: Every morning | ORAL | 2 refills | 90 days | Status: CP
Start: 2019-06-06 — End: ?

## 2019-06-06 MED ORDER — FLOVENT HFA 110 MCG/ACTUATION AEROSOL INHALER
3 refills | 0 days | Status: CP
Start: 2019-06-06 — End: ?

## 2019-06-06 MED ORDER — CLONAZEPAM 0.5 MG TABLET
ORAL_TABLET | Freq: Every evening | ORAL | 2 refills | 30.00000 days | Status: CP
Start: 2019-06-06 — End: ?

## 2019-06-10 ENCOUNTER — Ambulatory Visit (INDEPENDENT_AMBULATORY_CARE_PROVIDER_SITE_OTHER): Payer: Medicare Other | Admitting: Family Medicine

## 2019-06-10 ENCOUNTER — Encounter: Payer: Self-pay | Admitting: Family Medicine

## 2019-06-10 ENCOUNTER — Other Ambulatory Visit: Payer: Self-pay

## 2019-06-10 DIAGNOSIS — R110 Nausea: Secondary | ICD-10-CM

## 2019-06-10 DIAGNOSIS — R42 Dizziness and giddiness: Secondary | ICD-10-CM

## 2019-06-10 DIAGNOSIS — J4520 Mild intermittent asthma, uncomplicated: Secondary | ICD-10-CM

## 2019-06-10 DIAGNOSIS — G43909 Migraine, unspecified, not intractable, without status migrainosus: Secondary | ICD-10-CM

## 2019-06-10 DIAGNOSIS — E1169 Type 2 diabetes mellitus with other specified complication: Secondary | ICD-10-CM

## 2019-06-10 DIAGNOSIS — I10 Essential (primary) hypertension: Secondary | ICD-10-CM

## 2019-06-10 DIAGNOSIS — F339 Major depressive disorder, recurrent, unspecified: Secondary | ICD-10-CM

## 2019-06-10 DIAGNOSIS — E669 Obesity, unspecified: Secondary | ICD-10-CM

## 2019-06-10 DIAGNOSIS — M3213 Lung involvement in systemic lupus erythematosus: Secondary | ICD-10-CM

## 2019-06-10 NOTE — Progress Notes (Signed)
Name: Nicole Ballard   MRN: [id number]    DOB: 1956-08-22   Date:06/10/2019       Progress Note  Subjective  Chief Complaint  Chief Complaint  Patient presents with  . Medication Refill  . Diabetes    Has a eye appt on Thursday  . Hypertension    headaches-allergy related  . Depression  . Obesity  . Pituitary adenoma    I connected with  [name]  on 06/10/19 at 10:00 AM EDT by a video enabled telemedicine application and verified that I am speaking with the correct person using two identifiers.  I discussed the limitations of evaluation and management by telemedicine and the availability of in person appointments. The patient expressed understanding and agreed to proceed. Staff also discussed with the patient that there may be a patient responsible charge related to this service. Patient Location: at home  Provider Location: [facility]   HPI  HTN: Today is her 4 month follow up.  She has chest pain and has seen cardiologist, she had [facility] that was normal  INTERPRETATION ( done 02/2019 )  Normal Stress Echocardiogram NORMAL RIGHT VENTRICULAR SYSTOLIC FUNCTION TRIVIAL REGURGITATION NOTED (See above) NO VALVULAR STENOSIS NOTED  DM: denies polyphagia, polyuria or polydipsia.   MDD/Mild Neurocognitive Impairment: under the care of psychiatrist, she is still taking medications as prescribed, worrying more about the safety of her grandsons and sons, because of police brutality . She is taking melatonin to help her sleep at night.  Dyslipidemia: she is due for labs today, denies myalgia secondary to statin therapy . Reviewed last labs   Post-inflammatory pulmonary fibrosis/RAD: under the care of pulmonologist at [facility], she has occasional cough and SOB, she states she is using an inhaler prn only and is doing well at this time  Right hemicrania: she states symptoms started with right side head pain that started back in July, she states  pain still present and radiates from right side of head down right side of neck and right shoulder, it is daily but not constant. She states it can happen multiple times a day and duration of episodes varies . Pain is described as dull like. She states the pain makes the nausea worse and also vertigo more frequent and intense . She called her neurosurgeon for a follow up but states she needs a referral to be seen. She has a history of pituitary adenoma. She states her right eye hurst and also has double vision. No weakness , tingling or numbness. She states it has been difficulty doing activity of daily living but still able to do it slowly. She still has meclizine and zofran. I sent a rx for prednisone back in August but she states never got filled ( she states it was not at the pharmacy) . We will place referral to neurosurgeon since she is not any better  DMII: she has not been checking glucose at home, denies polyphagia, she states mouth is always dry and has to drink water, discussed eye exam.   Patient Active Problem List   Diagnosis Date Noted  . Hyperglycemia 10/23/2017  . Vertigo 09/29/2016  . Bronchiolitis obliterans organizing pneumonia ([place]) 09/29/2016  . Neck pain, musculoskeletal 12/15/2015  . Anxiety about health 09/29/2015  . Abnormal CAT scan 04/11/2015  . Auditory impairment 04/11/2015  . Insomnia, persistent 04/11/2015  . Chronic kidney disease (CKD), stage I 04/11/2015  . Chronic nonmalignant pain 04/11/2015  . Chronic constipation  04/11/2015  . Dyslipidemia 04/11/2015  . Fibromyalgia 04/11/2015  . Gastro-esophageal reflux disease without esophagitis 04/11/2015  . Dysphonia 04/11/2015  . Absence of bladder continence 04/11/2015  . Low back pain 04/11/2015  . Eczema intertrigo 04/11/2015  . Keratosis pilaris 04/11/2015  . Chronic recurrent major depressive disorder ([place]) 04/11/2015  . Dysmetabolic syndrome [id number]  . Neurogenic claudication 04/11/2015  . Perennial  allergic rhinitis with seasonal variation 04/11/2015  . Abnormal presence of protein in urine 04/11/2015  . Seborrhea capitis 04/11/2015  . Moderate dysplasia of vulva 04/11/2015  . Vitamin D deficiency 04/11/2015  . Treadmill stress test negative for angina pectoris 03/05/2015  . Shortness of breath 05/20/2014  . Asthma, chronic 12/31/2013  . H/O total knee replacement 12/31/2013  . Episcleritis 09/26/2013  . Vocal cord dysfunction 05/28/2013  . Anxiety, generalized 03/19/2013  . Back pain 12/18/2012  . Pulmonary nodules 11/26/2012  . Lung nodule, multiple 11/26/2012  . Arthritis, degenerative 11/01/2012  . H/O aspiration pneumonitis 10/22/2012  . Postinflammatory pulmonary fibrosis ([place]) 10/22/2012  . Mixed incontinence 05/04/2012  . Benign neoplasm of pituitary gland and craniopharyngeal duct ([place]) 12/29/2010  . Benign neoplasm of pituitary gland ([place]) 12/29/2010  . Lung involvement in systemic lupus erythematosus ([place]) 11/05/2010  . Chronic nausea 07/01/2008  . Benign hypertension 01/25/2005    Past Surgical History:  Procedure Laterality Date  . BRAIN SURGERY    . CHOLECYSTECTOMY    . VAGINAL HYSTERECTOMY      Family History  Problem Relation Age of Onset  . Breast cancer Mother   . Cancer Mother 84       Breast  . Cancer Father 13       Stomach  . Breast cancer Maternal Aunt     Social History   Socioeconomic History  . Marital status: Divorced    Spouse name: Not on file  . Number of children: 2  . Years of education: Not on file  . Highest education level: GED or equivalent  Occupational History  . Occupation: Disability  Social Needs  . Financial resource strain: Somewhat hard  . Food insecurity    Worry: Sometimes true    Inability: Sometimes true  . Transportation needs    Medical: No    Non-medical: No  Tobacco Use  . Smoking status: Never Smoker  . Smokeless tobacco: Never Used  Substance and Sexual Activity  . Alcohol use: No     Alcohol/week: 0.0 standard drinks  . Drug use: No  . Sexual activity: Not Currently    Partners: Male  Lifestyle  . Physical activity    Days per week: 4 days    Minutes per session: 40 min  . Stress: Not at all  Relationships  . Social Herbalist on phone: Three times a week    Gets together: Never    Attends religious service: More than 4 times per year    Active member of club or organization: Yes    Attends meetings of clubs or organizations: More than 4 times per year    Relationship status: Divorced  . Intimate partner violence    Fear of current or ex partner: No    Emotionally abused: No    Physically abused: No    Forced sexual activity: No  Other Topics Concern  . Not on file  Social History Narrative  . Not on file     Current Outpatient Medications:  .  amLODipine (NORVASC) 2.5 MG tablet, TAKE 1  TABLET BY MOUTH  DAILY, Disp: 90 tablet, Rfl: 3 .  aspirin EC 81 MG tablet, Take 1 tablet (81 mg total) by mouth daily., Disp: 30 tablet, Rfl: 0 .  atorvastatin (LIPITOR) 40 MG tablet, TAKE 1 TABLET BY MOUTH  DAILY, Disp: 90 tablet, Rfl: 3 .  Biotin 1 MG CAPS, Take 1 capsule by mouth daily., Disp: 30 capsule, Rfl: 0 .  buPROPion (WELLBUTRIN XL) 150 MG 24 hr tablet, Take 1 tablet (150 mg total) by mouth daily., Disp: 30 tablet, Rfl: 0 .  Cholecalciferol (VITAMIN D) 2000 UNITS CAPS, Take 1 capsule by mouth daily., Disp: , Rfl:  .  clonazePAM (KLONOPIN) 0.5 MG tablet, Take 0.5 tablets (0.25 mg total) by mouth daily., Disp: 15 tablet, Rfl: 0 .  cyclobenzaprine (FLEXERIL) 5 MG tablet, TAKE 1 TABLET BY MOUTH  EVERY 8 HOURS AS NEEDED FOR MUSCLE SPASMS. DO NOT DRIVE FOR 8 HOURS, Disp: 90 tablet, Rfl: 1 .  diclofenac sodium (VOLTAREN) 1 % GEL, APP 2 GRAMS EXT AA QID, Disp: , Rfl: 6 .  DULoxetine (CYMBALTA) 60 MG capsule, Take 120 mg by mouth at bedtime. , Disp: , Rfl:  .  fluticasone (FLONASE) 50 MCG/ACT nasal spray, Place 2 sprays into both nostrils daily., Disp: 48 g,  Rfl: 3 .  gabapentin (NEURONTIN) 300 MG capsule, Take 1 capsule by mouth at bedtime., Disp: , Rfl:  .  halobetasol (ULTRAVATE) 0.05 % ointment, Apply topically., Disp: , Rfl:  .  hydroxychloroquine (PLAQUENIL) 200 MG tablet, Take 200 mg by mouth 2 (two) times daily., Disp: , Rfl:  .  hydrOXYzine (ATARAX/VISTARIL) 10 MG tablet, Take 1 tablet (10 mg total) by mouth every 6 (six) hours as needed., Disp: 60 tablet, Rfl: 0 .  lamoTRIgine (LAMICTAL) 200 MG tablet, Take 1 tablet (200 mg total) by mouth 2 (two) times daily., Disp: 60 tablet, Rfl: 0 .  meclizine (ANTIVERT) 25 MG tablet, Take 1 tablet (25 mg total) by mouth 3 (three) times daily as needed., Disp: 30 tablet, Rfl: 0 .  mycophenolate (MYFORTIC) 360 MG TBEC EC tablet, Take 360 mg by mouth 2 (two) times daily., Disp: , Rfl:  .  olopatadine (PATANOL) 0.1 % ophthalmic solution, Place 1 drop into both eyes 2 (two) times daily., Disp: 5 mL, Rfl: 3 .  omeprazole (PRILOSEC) 40 MG capsule, TAKE 1 CAPSULE BY MOUTH  DAILY, Disp: 90 capsule, Rfl: 3 .  ondansetron (ZOFRAN) 4 MG tablet, TAKE 1 TABLET BY MOUTH  EVERY 8 HOURS AS NEEDED FOR NAUSEA AND VOMITING, Disp: 60 tablet, Rfl: 0 .  Polyethylene Glycol 3350 (MIRALAX PO), Take by mouth., Disp: , Rfl:  .  predniSONE (DELTASONE) 10 MG tablet, Take 1 tablet (10 mg total) by mouth 2 (two) times daily with a meal., Disp: 10 tablet, Rfl: 0 .  telmisartan (MICARDIS) 40 MG tablet, Take 1 tablet (40 mg total) by mouth daily., Disp: 90 tablet, Rfl: 1 .  traZODone (DESYREL) 100 MG tablet, Take 200 mg by mouth at bedtime. , Disp: , Rfl:  .  triamcinolone ointment (KENALOG) 0.1 %, , Disp: , Rfl:   Allergies  Allergen Reactions  . Ace Inhibitors     Other reaction(s): OTHER Pt states she can not take ace inhibitors. Pt states she can not remember her reaction.   . Bee Pollen   . Penicillins   . Pollen Extract     I personally reviewed active problem list, medication list, allergies, family history, social  history with the patient/caregiver today.  ROS  Ten systems reviewed and is negative except as mentioned in HPI   Objective  Virtual encounter, vitals not obtained.  There is no height or weight on file to calculate BMI.  Physical Exam  Awake, alert and oriented   PHQ2/9: Depression screen [facility] 2/9 06/10/2019 04/19/2019 03/25/2019 02/07/2019 12/11/2018  Decreased Interest 1 1 0 0 0  Down, Depressed, Hopeless 2 3 0 0 0  PHQ - 2 Score 3 4 0 0 0  Altered sleeping 3 2 0 0 0  Tired, decreased energy 1 2 0 0 0  Change in appetite 2 1 0 0 0  Feeling bad or failure about yourself  3 0 0 0 0  Trouble concentrating 0 2 0 0 0  Moving slowly or fidgety/restless 2 0 0 0 0  Suicidal thoughts 0 0 0 0 0  PHQ-9 Score 14 11 0 0 0  Difficult doing work/chores Very difficult Somewhat difficult - Not difficult at all Not difficult at all  Some recent data might be hidden   PHQ-2/9 Result is positive.    Fall Risk: Fall Risk  06/10/2019 04/19/2019 03/25/2019 02/07/2019 12/11/2018  Falls in the past year? 1 1 1 1 1   Number falls in past yr: 1 1 1 1 1   Comment - - - - -  Injury with Fall? 0 0 1 0 0  Risk for fall due to : Impaired balance/gait Impaired balance/gait History of fall(s) - -  Risk for fall due to: Comment - - - - -  Follow up - - - - -     Assessment & Plan  1. Hemicrania  - Ambulatory referral to Neurology  2. Chronic nausea  Stable with medication   3. Vertigo  - Ambulatory referral to Neurology  4. Benign hypertension  Recheck during CMA visit  5. Chronic recurrent major depressive disorder [facility])  Keep follow up with psychiatrist and psychologist   6. Morbid obesity ([place])  Discussed with the patient the risk posed by an increased BMI. Discussed importance of portion control, calorie counting and at least 150 minutes of physical activity weekly. Avoid sweet beverages and drink more water. Eat at least 6 servings of fruit and vegetables daily   7. Diabetes mellitus  type 2 in obese ([place])  Needs to come in for A1C  8. Lung involvement in systemic lupus erythematosus ([place])  Under the care of pulmonologist   9. Mild intermittent reactive airway disease without complication  On prn medication  I discussed the assessment and treatment plan with the patient. The patient was provided an opportunity to ask questions and all were answered. The patient agreed with the plan and demonstrated an understanding of the instructions.  The patient was advised to call back or seek an in-person evaluation if the symptoms worsen or if the condition fails to improve as anticipated.  I provided 25  minutes of non-face-to-face time during this encounter.

## 2019-06-12 ENCOUNTER — Ambulatory Visit (INDEPENDENT_AMBULATORY_CARE_PROVIDER_SITE_OTHER): Payer: Medicare Other

## 2019-06-12 ENCOUNTER — Other Ambulatory Visit: Payer: Self-pay

## 2019-06-12 ENCOUNTER — Encounter: Admit: 2019-06-12 | Discharge: 2019-06-13 | Payer: MEDICARE | Attending: Psychologist | Primary: Psychologist

## 2019-06-12 VITALS — BP 120/80 | HR 68

## 2019-06-12 DIAGNOSIS — Z23 Encounter for immunization: Secondary | ICD-10-CM | POA: Diagnosis not present

## 2019-06-12 DIAGNOSIS — E1169 Type 2 diabetes mellitus with other specified complication: Secondary | ICD-10-CM

## 2019-06-12 DIAGNOSIS — E669 Obesity, unspecified: Secondary | ICD-10-CM | POA: Diagnosis not present

## 2019-06-12 DIAGNOSIS — F410 Panic disorder [episodic paroxysmal anxiety] without agoraphobia: Secondary | ICD-10-CM

## 2019-06-12 DIAGNOSIS — F4310 Post-traumatic stress disorder, unspecified: Secondary | ICD-10-CM

## 2019-06-12 DIAGNOSIS — F331 Major depressive disorder, recurrent, moderate: Secondary | ICD-10-CM

## 2019-06-12 DIAGNOSIS — F411 Generalized anxiety disorder: Secondary | ICD-10-CM

## 2019-06-12 LAB — POCT GLYCOSYLATED HEMOGLOBIN (HGB A1C): Hemoglobin A1C: 5.4 % (ref 4.0–5.6)

## 2019-06-12 NOTE — Progress Notes (Signed)
Patient is here for a blood pressure check. Patient denies chest pain, palpitations, shortness of breath or visual disturbances. Today during nurse visit first check blood pressure was 120/80 with a heart rate of 68.

## 2019-06-12 NOTE — Unmapped (Signed)
[facility]   Confidential Psychological Therapy Session      Patient Name: RUT BETTERTON  Medical Record Number: [id number]  Date of Service: June 12, 2019  Attending Psychologist: Caroline More, PhD  CPT Procedure Codes: 81191 for 60 mins of face to face counseling    Due to the current declared state emergency during the coronavirus pandemic, all non-urgent medical and mental health visits have been triaged to either be delayed or take place virtually via phone or videoconferencing.   Due to the need for continued patient interaction for mental health care, pain management, and care coordination that necessitates the involvement of this provider, this visit was performed face to face using interactive technology using a HIPPA compliant audio/visual platform. This patient will be scheduled for face to face visits in the future once it has been deemed safe.      We reviewed confidentiality today. The patient was present at home (location and contact information confirmed), attended this visit alone, and consented to this virtual pain psychology visit.     Visit modifiers:   GT for Interactive Technology and CR for catastrophe/disaster related due to coronavirus pandemic    REFERRING PHYSICIAN: Clarene Essex, MD    CHIEF COMPLAINT AND REASON FOR VISIT: pain coping skills, CBT to address depression and anxiety in the setting of chronic pain    SUBJECTIVE / HISTORY OF PRESENT ILLNESS: Ms.  Koopmann is a very pleasant 63 y.o.  female from [place], [place] with multiple chronic pain complaints related to fibromyalgia and lupus who initially met with me in October 2016, and which time she was diagnosed with severe depression, PTSD, panic disorder, and generalized anxiety.The patient returns for a therapy session today. Last follow up with me was 05/21/2019. Pt processes thoughts and feelings related to systemic racism and emotional stress. Connected to fear about her 91 year old grandson and his health/safety. Noted she has not been to the gym because of COVID and has not been walking much because it hurts her knees. Subsequently has had worsening all body pain, tightness, and weight gain (10 lbs). Pt noted the weight gain and lack of exercise has contributed to worsening depression (denies SI). Worked on behavioral activation goals today. Set goal of walking eod and portion control/healthy eating. Spent extra time today addressing behavioral relaxation plan - to start daily mindfulness practice using Insight Timer.     OBJECTIVE / MENTAL STATUS:    Appearance:   Appears stated age and Clean/Neat   Motor:  No abnormal movements   Speech/Language:   Normal rate, volume, tone, fluency   Mood:  Depressed and Anxious   Affect:  Bright, full   Thought process:  Logical, linear, clear, coherent, goal directed   Thought content:    Denies SI, HI, self harm, delusions, obsessions, paranoid ideation, or ideas of reference   Perceptual disturbances:    Denies auditory and visual hallucinations, behavior not concerning for response to internal stimuli   Orientation:  Oriented to person, place, time, and general circumstances   Attention:  Able to fully attend without fluctuations in consciousness   Concentration:  Able to fully concentrate and attend   Memory:  Immediate, short-term, long-term, and recall grossly intact    Fund of knowledge:   Consistent with level of education and development   Insight:    Fair   Judgment:   Intact   Impulse Control:  Intact     DIAGNOSTIC IMPRESSION:   Post Traumatic  Stress Disorder (PTSD)  Generalized Anxiety Disorder (GAD)  Panic Disorder  Major Depressive Disorder, moderate, recurrent  Chronic pain syndrome  Fibromyalgia  Lupus    ASSESSMENT:   Ms.  Kenedy is a very pleasant 63 y.o.  female from [place], [place] with multiple chronic pain complaints related to lupus, arthritis, and fibromyalgia. She was previously seen in our clinic from 2012-2014, and reestablished care with Dr. Fayrene Fearing in August 2016. The patient is struggling with depression and anxiety, but is very motivated to participate in intensive, multidisciplinary treatment to address the connection between depression, anxiety and pain. She has a long-standing history of depression and anxiety, and has worked with outpatient psychiatrist and therapist over the years. She is currently established with [facility] psychiatry, and is tapering off of Klonopin.  Depression, anxiety, and panic have all improved.  Has been diagnosed with diabetes, and is managing it well with p.o. medication and dietary changes.  Has lost 50+ pounds.  Patient presents with low-grade depression related to COVID and social isolation.  Focused on this today, with cognitive and behavioral coping strategies.      PLAN:   (1) Psychotherapy - Continue CBT. CBT will be used to address PTSD, MDD, Panic D/o, GAD, and chronic pain.     --Behavioral Activation - Encouraged behavioral activation.  Is doing great, walking 5 days/week  --Downloaded and uses Insight Timer, guided relaxation app, for nightly practice.  --Continues to utilize diaphragmatic breathing daily    (2) Psychiatry - Pt currently established with [facility].      (3) Nutritionist - saw a nutritionist and found it helpful. Has lost weight, related to nausea. Exercising more too. Lost 50+ lbs in last year (off steriods for quite some time now), but gained 10 lbs during COVID    (4) Safety - Pt denies any current or recent SI or safety concerns. Knows to call 911 or go to her local ED. Also previously given [facility].      (5) follow-up - with me 11/10

## 2019-06-15 ENCOUNTER — Other Ambulatory Visit: Payer: Self-pay | Admitting: Family Medicine

## 2019-06-15 DIAGNOSIS — R110 Nausea: Secondary | ICD-10-CM

## 2019-06-15 NOTE — Telephone Encounter (Signed)
Requested medication (s) are due for refill today yes  Requested medication (s) are on the active medication list: yes  Last refill: 04/19/2019  #60  0 refills  Future visit scheduled  yes  Notes to clinic: Not delegated  Requested Prescriptions  Pending Prescriptions Disp Refills   ondansetron (ZOFRAN) 4 MG tablet [Pharmacy Med Name: ONDANSETRON  4MG   TAB] 60 tablet 0    Sig: TAKE 1 TABLET BY MOUTH  EVERY 8 HOURS AS NEEDED FOR NAUSEA AND VOMITING     Not Delegated - Gastroenterology: Antiemetics Failed - 06/15/2019  4:58 PM      Failed - This refill cannot be delegated      Passed - Valid encounter within last 6 months    Recent Outpatient Visits          5 days ago [facility] Steele Sizer, MD   1 month ago [facility] Steele Sizer, MD   3 months ago Generalized pruritus   [facility] Steele Sizer, MD   4 months ago Benign hypertension   [facility] Steele Sizer, MD   4 months ago Chest discomfort   [facility] Steele Sizer, MD      Future Appointments            In 3 months Ancil Boozer, Drue Stager, MD [facility], [facility]

## 2019-06-18 ENCOUNTER — Ambulatory Visit: Admit: 2019-06-18 | Discharge: 2019-06-19 | Payer: MEDICARE

## 2019-06-18 DIAGNOSIS — H04123 Dry eye syndrome of bilateral lacrimal glands: Secondary | ICD-10-CM | POA: Diagnosis not present

## 2019-06-18 DIAGNOSIS — H269 Unspecified cataract: Secondary | ICD-10-CM | POA: Diagnosis not present

## 2019-06-18 DIAGNOSIS — Z79899 Other long term (current) drug therapy: Secondary | ICD-10-CM | POA: Diagnosis not present

## 2019-06-18 DIAGNOSIS — M329 Systemic lupus erythematosus, unspecified: Secondary | ICD-10-CM | POA: Diagnosis not present

## 2019-06-18 DIAGNOSIS — H43393 Other vitreous opacities, bilateral: Secondary | ICD-10-CM | POA: Diagnosis not present

## 2019-06-18 DIAGNOSIS — H04129 Dry eye syndrome of unspecified lacrimal gland: Principal | ICD-10-CM

## 2019-06-18 LAB — HM DIABETES EYE EXAM

## 2019-06-18 NOTE — Unmapped (Addendum)
1. Plaquenil 200mg bid since appx 2000 for lupus  - 94 kg  - patient denies changes in vision  - normal exam and testing.  2. Dry eyes  -artificial tears 4/4  3 syneresis OU- stable.  4. cataract- not visually significant. Observe. New rx provided.      rtc 1 year OCT FAF, VF  Cc Dr. R. Ishizawar    INTERPRETATION EXTENDED OPHTHALMOSCOPT MACULA (90Dlens)  Macula - right:  Normal, no maculopathy, +syneresis  Macula - left:  Normal, no maculopathy, + syneresis

## 2019-06-25 ENCOUNTER — Encounter
Admit: 2019-06-25 | Discharge: 2019-06-26 | Payer: MEDICARE | Attending: Student in an Organized Health Care Education/Training Program | Primary: Student in an Organized Health Care Education/Training Program

## 2019-06-25 DIAGNOSIS — F329 Major depressive disorder, single episode, unspecified: Principal | ICD-10-CM

## 2019-06-25 DIAGNOSIS — F411 Generalized anxiety disorder: Principal | ICD-10-CM

## 2019-06-25 MED ORDER — LAMOTRIGINE 200 MG TABLET: 200 mg | tablet | Freq: Two times a day (BID) | 2 refills | 90 days | Status: AC

## 2019-06-25 MED ORDER — DULOXETINE 60 MG CAPSULE,DELAYED RELEASE: 120 mg | capsule | Freq: Every day | 2 refills | 90 days | Status: AC

## 2019-06-25 MED ORDER — BUPROPION HCL XL 150 MG 24 HR TABLET, EXTENDED RELEASE: 150 mg | tablet | Freq: Every morning | 2 refills | 90 days | Status: AC

## 2019-06-25 MED ORDER — TRAZODONE 100 MG TABLET: 200 mg | tablet | Freq: Every evening | 2 refills | 90 days | Status: AC

## 2019-06-25 NOTE — Unmapped (Signed)
[facility]  Psychiatry Telehealth Encounter  Established Patient     I spent 35 minutes on the real-time audio and video with the patient. I spent an additional 10 minutes on pre- and post-visit activities.     The patient was physically located in [place] or a state in which I am permitted to provide care. The patient and/or parent/guardian understood that s/he may incur co-pays and cost sharing, and agreed to the telemedicine visit. The visit was reasonable and appropriate under the circumstances given the patient's presentation at the time.    The patient and/or parent/guardian has been advised of the potential risks and limitations of this mode of treatment (including, but not limited to, the absence of in-person examination) and has agreed to be treated using telemedicine. The patient's/patient's family's questions regarding telemedicine have been answered.     If the visit was completed in an ambulatory setting, the patient and/or parent/guardian has also been advised to contact their provider???s office for worsening conditions, and seek emergency medical treatment and/or call 911 if the patient deems either necessary.    Assessment:  Caitlin Smith is a 63 y.o. female with a history of MDD, GAD, and Panic Disorder in the context of many chronic medical conditions including chronic pain/fibromyalgia, osteoarthritis with degenerative disc disease, Lupus, pituitary tumor (benign), s/p right total knee arthroplasty, and VIN II (s/p resection), who presents for follow-up evaluation. She has been maintained on Lamictal for many years as well as cymbalta, trazodone, klonopin, and wellbutrin. Patient has previously tolerated a slight taper in her klonopin from 0.75 mg to 0.25 mg qHS overtime. Her mood is often exacerbated by steroid use for current treatment of pulmonary disease. Patient is engaged with CBT therapy at the pain clinic. Met [name] today 10/20 for epic video-visit.  She reports betterment of her anxiety and depression.  She feels that the discussions we had last visit were fruitful for her, and that at current she feels less focused on social issues in the country.  She feels like she is still learning about the subject but is engaging in it less frequently as a matter of self care. We continue to discuss looking for resources such as group meetings or literature about this. She continues to stay social with her family and connects virtually with her church. She reports improved sleep using melatonin. [name] reports that she will call clinicians and her children should she feel overwhelmed by her stress and anxiety or have any thoughts of SI, which she denies at today's visit.  Will follow-up in about a month.     Risk Assessment:  A full suicide and violence risk assessment was performed as part of this patient's initial evaluation with [facility] outpatient psychiatry.  There is no new acute risk for suicide or violence at this time. The patient was educated about relevant modifiable risk factors including following recommendations for treatment of psychiatric illness and abstaining from substance abuse.         While future psychiatric events cannot be accurately predicted, the patient does not currently require acute inpatient psychiatric care and does not currently meet [facility] involuntary commitment criteria.     Plan:  # Depression - Anxiety - Panic Disorder  - Continue Lamictal 400mg  qhs for mood  - Continue Cymbalta 120mg  qAM to target depressive sx and chronic pain  - Continue Wellbutrin XL 150mg  qAM   - Continue Clonazepam 0.25mg  qHS, appropriate filling per NCCSRS  - Continue  CBT at pain clinic # Insomnia: Last sleep study performed in 2017. Per chart review, showed mild OSA likely related to medication use. Neurology recommends patient avoid sleeping on back as she likely has OSA when sleeping in this position  - Continue Trazodone 200mg  qHS for insomnia. May plan to discontinue in future if insomnia improves after discontinuation of Wellbutrin and initiation of melatonin.   - Clonazepam per above    # Mild Neurocognitive Impairment: Patient seen by Neurology ([facility]) 05/22/18 who dx pt with mild neurocognitive impairment w/ deficits in inattention and visual-spatial function with suspicion that sleep apnea and polypharmacy may be contributing to her difficulties. Recommended that psychopharm regimen be simplified, including eliminating trazodone and considering changing Wellbutrin to Zoloft or Lexapro for anxiety and eliminating Klonopin when stable. As of 10/20, we have not been able to decrease clonazepam or discontinue wellbutrin. Pandemic and social atmosphere make changes in pharm more challenging at this time  - Plan for discontinuation of Wellbutrin per above; this is a continuing discussion, [name] feels like  - Will consider taper/discontinuation of Trazodone vs Klonopin next if sleep improves after above changes  - However, it is very likely that patient's lupus is significantly contributing to cognitive decline, which will limit improvement she will see with reduction in polypharmacy  --Last MOCA 22 on 12/12/2017    # Medical Monitoring - High Risk Medication Use - The complexity of this patient's care involves drug therapy which requires intensive monitoring for toxicity. This is in part due to a narrow therapeutic window, as well as potential for toxicity. This patient is being treated with Lamotrigine (Lamictal) to target their psychiatric disorder, Major depressive disorder, refractory. In regards to this medication being considered high risk, Lamotrigine (lamictal) has black box warning for serious rash including fatal reaction of Stevens-Johnson syndrome and rare cases of toxic epidermal necrolysis. In addition to prolonged titration to mitigate risk of serious rash, lamotrigine requires monitoring of hepatic and renal function at baseline, and at times will require monitoring of lamotrigine blood levels.    Lamotrigine Monitoring  - Hepatic function testing, platelets (via CBC) q6 months  -AST/ALT WNL     Lab Results   Component Value Date    WBC 4.6 07/01/2019    HGB 14.7 07/01/2019    HCT 48.2 (H) 07/01/2019    PLT 333 07/01/2019       Lab Results   Component Value Date    NA 138 05/22/2018    K 5.1 (H) 05/22/2018    CL 100 05/22/2018    CO2 33.0 (H) 05/22/2018    BUN 12 04/08/2019    CREATININE 0.90 07/01/2019    GLU 99 05/22/2018    CALCIUM 9.3 05/22/2018    MG 2.1 12/31/2013    PHOS 4.9 (H) 12/31/2013       Lab Results   Component Value Date    BILITOT 0.4 05/22/2018    BILIDIR <0.10 05/01/2018    PROT 7.7 09/20/2018    ALBUMIN 4.0 09/20/2018    ALT 15 07/01/2019    AST 30 07/01/2019    ALKPHOS 74 05/22/2018    GGT 19 08/26/2012       Lab Results   Component Value Date    PT 12.0 09/17/2012    INR 1.11 01/03/2014    APTT 30.8 01/03/2014         # Return to Clinic: 12/15 via epic video    Psychotherapy:  No billable psychotherapy service  provided but brief supportive therapy was utilized.    Patient has been given this writer's contact information as well as the [facility] Psychiatry urgent line number. The patient has been instructed to call 911 for emergencies. Patient and plan of care were discussed with the Attending MD,Dr. Cecilio Asper, who agrees with the above statement and plan.     Connye Burkitt MD    Subjective:     Psychiatric Chief Concern:  Follow-up psychiatric evaluation for depression, anxiety and insomnia.     Interval History: Ms. Melle says that she is feeling better than she had the last time we talked. In particular, she says that she hasn't been as focused on the news and she feels like this is helping her anxiety.  She is still interested in learning about racism in [place], but feels like it's occupying less of her time mentally. We discuss some readings that this writer found. She feels like her sleep is improved; she reports taking melatonin and finding it really helpful. She's taking 10mg .   She feels like she'd like to continue medication regimen as is because she feels like it works well. She denies SI or worsening depressive symptoms. Will follow up in about two months.     Social History: reviewed; pertinents have been documented in the interval history section.    ROS:  As per Interval History and:  Constitutional:  no significant appetite change, endorses lessened anxiety, improved sleep  Neuro:  none / negative    Objective:    Mental Status Exam: Completed 06/25/19 and similar to prior  Appearance  Patient in view of camera, appears stated age, well-nourished , well-groomed       Speech/Language:    Normal rate, volume, tone, fluency and Language intact, well formed   Affect  Calm, mood congruent   Mood:   Better since the last time we talked   Thought process and Associations:   Logical, linear, clear, coherent, goal directed   Abnormal/psychotic thought content:     Denies SI, HI, self harm, delusions, obsessions, paranoid ideation, or ideas of reference   Perceptual disturbances:     Does not endorse auditory or visual hallucinations     Orientation:   Oriented to person, place, time, and general circumstances   Insight:     Intact Judgment:    Intact   Impulse Control:   Intact     Physical Exam:   Gen: Patient in view of camera, sitting on cough INAD, wearing glasses  HEENT: Normocephalic, atraumatic  Resp: Does not appear to have increased WOB    Medications: reviewed at today's visit    Psychometrics:  Psych Scale Scores - Adult      Office Visit from 09/04/2018 in [facility] PSY[facility]IATRY Transplant AT [facility]   **PHQ-9: Severity Measure for DEPRESSION Total Score**  20 Collected on 09/04/2018 0000   WHODAS 2.0 (Self-administered) - Total Score  42 Collected on 09/04/2018 0000          Posey Rea, MD

## 2019-07-01 ENCOUNTER — Other Ambulatory Visit: Payer: Self-pay

## 2019-07-01 ENCOUNTER — Encounter: Admit: 2019-07-01 | Discharge: 2019-07-02 | Payer: MEDICARE

## 2019-07-01 DIAGNOSIS — M329 Systemic lupus erythematosus, unspecified: Secondary | ICD-10-CM | POA: Diagnosis not present

## 2019-07-01 LAB — CBC W/ AUTO DIFF
BASOPHILS ABSOLUTE COUNT: 0.1 10*9/L (ref 0.0–0.1)
BASOPHILS RELATIVE PERCENT: 2.1 %
EOSINOPHILS RELATIVE PERCENT: 2.5 %
HEMOGLOBIN: 14.7 g/dL (ref 12.0–16.0)
LARGE UNSTAINED CELLS: 3 % (ref 0–4)
LYMPHOCYTES ABSOLUTE COUNT: 1.7 10*9/L (ref 1.5–5.0)
LYMPHOCYTES RELATIVE PERCENT: 37.2 %
MEAN CORPUSCULAR HEMOGLOBIN CONC: 30.5 g/dL — ABNORMAL LOW (ref 31.0–37.0)
MEAN CORPUSCULAR HEMOGLOBIN: 27.6 pg (ref 26.0–34.0)
MEAN CORPUSCULAR VOLUME: 90.3 fL (ref 80.0–100.0)
MEAN PLATELET VOLUME: 10.6 fL — ABNORMAL HIGH (ref 7.0–10.0)
MONOCYTES ABSOLUTE COUNT: 0.3 10*9/L (ref 0.2–0.8)
MONOCYTES RELATIVE PERCENT: 6.6 %
NEUTROPHILS ABSOLUTE COUNT: 2.2 10*9/L (ref 2.0–7.5)
NEUTROPHILS RELATIVE PERCENT: 48.4 %
PLATELET COUNT: 333 10*9/L (ref 150–440)
RED BLOOD CELL COUNT: 5.34 10*12/L — ABNORMAL HIGH (ref 4.00–5.20)
RED CELL DISTRIBUTION WIDTH: 14.6 % (ref 12.0–15.0)
WBC ADJUSTED: 4.6 10*9/L (ref 4.5–11.0)

## 2019-07-01 LAB — URINALYSIS WITH CULTURE REFLEX
BACTERIA: NONE SEEN /HPF
BLOOD UA: NEGATIVE
GLUCOSE UA: NEGATIVE
KETONES UA: NEGATIVE
NITRITE UA: NEGATIVE
PH UA: 5 (ref 5.0–9.0)
PROTEIN UA: NEGATIVE
RBC UA: 2 /HPF (ref <=4)
SPECIFIC GRAVITY UA: 1.025 (ref 1.003–1.030)
SQUAMOUS EPITHELIAL: <1 /HPF (ref 0–5)
UROBILINOGEN UA: 2 — AB
WBC UA: 2 /HPF (ref 0–5)

## 2019-07-01 LAB — CREATININE
CREATININE: 0.9 mg/dL (ref 0.60–1.00)
Creatinine:MCnc:Pt:Ser/Plas:Qn:: 0.9

## 2019-07-01 LAB — AST (SGOT): Aspartate aminotransferase:CCnc:Pt:Ser/Plas:Qn:: 30

## 2019-07-01 LAB — C4 COMPLEMENT: Complement C4:MCnc:Pt:Ser/Plas:Qn:: 19.4

## 2019-07-01 LAB — HYPOCHROMIA

## 2019-07-01 LAB — C3 COMPLEMENT: Complement C3:MCnc:Pt:Ser/Plas:Qn:: 106

## 2019-07-01 LAB — SPECIFIC GRAVITY UA: Specific gravity:Rden:Pt:Urine:Qn:: 1.025

## 2019-07-01 LAB — PROTEIN URINE: Protein:MCnc:Pt:Urine:Qn:: 15.3

## 2019-07-01 LAB — PROTEIN / CREATININE RATIO, URINE
CREATININE, URINE: 261.7 mg/dL
PROTEIN URINE: 15.3 mg/dL

## 2019-07-01 LAB — ALT (SGPT): Alanine aminotransferase:CCnc:Pt:Ser/Plas:Qn:: 15

## 2019-07-01 NOTE — Patient Outreach (Signed)
[facility]) Care Management  07/01/2019  [place] [birth year] [id number]   Medication Adherence call to Mrs. [place] Compliant Voice message left with a call back number. Mrs. Macdonnell is showing past due on Metformin Er 500 mg under [place].   [place] Management Direct Dial [phone]  Fax [phone] [email or web address]

## 2019-07-02 LAB — DSDNA ANTIBODY: Lab: NEGATIVE

## 2019-07-03 ENCOUNTER — Telehealth: Payer: Self-pay

## 2019-07-03 NOTE — Telephone Encounter (Signed)
Copied from [place] [phone]. Topic: Referral - Status >> Jul 03, 2019 12:26 PM Reyne Dumas L wrote: Reason for CRM:   Pt calling to check on the status of her referral to the neurologist in [facility].  Pt states that she is still having head and neck pain and wants to know if there is anyone else who can see her sooner, since she isn't getting any information from [facility]. Pt can be reached at [phone]   I contacted the [facility] Neu[facility] office and was told that their protocol is that it goes through a 14 business day review and if approved then they will reach out to the patient, if it is declined then the referral will come back to the referred by office ([place]).  Please advise what you want me to do since it was sent on 06/10/19 making the 14 day review end on last Friday.

## 2019-07-04 ENCOUNTER — Ambulatory Visit: Payer: Self-pay

## 2019-07-04 NOTE — Telephone Encounter (Signed)
Pt. Reports she has felt bad for "about 2 weeks." Headache, body aches and shortness of breath with walking. States she will go and be tested tomorrow. Testing sites are closed today due to weather. Instructed to call back as needed. Refuses virtual visit today. Answer Assessment - Initial Assessment Questions 1. COVID-19 DIAGNOSIS: "Who made your Coronavirus (COVID-19) diagnosis?" "Was it confirmed by a positive lab test?" If not diagnosed by a HCP, ask "Are there lots of cases (community spread) where you live?" (See public health department website, if unsure)     No test 2. ONSET: "When did the COVID-19 symptoms start?"      2 weeks ago 3. WORST SYMPTOM: "What is your worst symptom?" (e.g., cough, fever, shortness of breath, muscle aches)     Headache and body aches 4. COUGH: "Do you have a cough?" If so, ask: "How bad is the cough?"       Some shortness of breath 5. FEVER: "Do you have a fever?" If so, ask: "What is your temperature, how was it measured, and when did it start?"     Yes - did not check it 6. RESPIRATORY STATUS: "Describe your breathing?" (e.g., shortness of breath, wheezing, unable to speak)      Shortness of breath 7. BETTER-SAME-WORSE: "Are you getting better, staying the same or getting worse compared to yesterday?"  If getting worse, ask, "In what way?"     Same 8. HIGH RISK DISEASE: "Do you have any chronic medical problems?" (e.g., asthma, heart or lung disease, weak immune system, etc.)     Yes 9. PREGNANCY: "Is there any chance you are pregnant?" "When was your last menstrual period?"     No 10. OTHER SYMPTOMS: "Do you have any other symptoms?"  (e.g., chills, fatigue, headache, loss of smell or taste, muscle pain, sore throat)       No  Protocols used: CORONAVIRUS (COVID-19) DIAGNOSED OR SUSPECTED-A-AH

## 2019-07-04 NOTE — Unmapped (Signed)
Phone call

## 2019-07-04 NOTE — Unmapped (Signed)
Pt called in to address lab results. Told pt that I would send a message to Dr. Gwynneth Munson to give her a call back. Sent Dr. Gwynneth Munson a message to call pt about recent lab work.

## 2019-07-05 ENCOUNTER — Other Ambulatory Visit: Payer: Self-pay | Admitting: *Deleted

## 2019-07-05 DIAGNOSIS — Z20822 Contact with and (suspected) exposure to covid-19: Secondary | ICD-10-CM

## 2019-07-05 NOTE — Telephone Encounter (Signed)
Left message explaining it could take Neurologist up to a month to schedule an appt and per Dr. Ancil Boozer there is nothing to do right now.

## 2019-07-06 LAB — NOVEL CORONAVIRUS, NAA: SARS-CoV-2, NAA: NOT DETECTED

## 2019-07-16 ENCOUNTER — Encounter: Admit: 2019-07-16 | Discharge: 2019-07-17 | Payer: MEDICARE | Attending: Psychologist | Primary: Psychologist

## 2019-07-16 DIAGNOSIS — F331 Major depressive disorder, recurrent, moderate: Principal | ICD-10-CM

## 2019-07-16 DIAGNOSIS — F411 Generalized anxiety disorder: Principal | ICD-10-CM

## 2019-07-16 DIAGNOSIS — F410 Panic disorder [episodic paroxysmal anxiety] without agoraphobia: Principal | ICD-10-CM

## 2019-07-16 DIAGNOSIS — F4310 Post-traumatic stress disorder, unspecified: Principal | ICD-10-CM

## 2019-07-16 NOTE — Unmapped (Signed)
[facility]   Confidential Psychological Therapy Session      Patient Name: ARABELLA REVELLE  Medical Record Number: [id number]  Date of Service: July 16, 2019  Attending Psychologist: Caroline More, PhD  CPT Procedure Codes: [phone] for 60 mins of face to face counseling    Due to the current declared state emergency during the coronavirus pandemic, all non-urgent medical and mental health visits have been triaged to either be delayed or take place virtually via phone or videoconferencing.   Due to the need for continued patient interaction for mental health care, pain management, and care coordination that necessitates the involvement of this provider, this visit was performed face to face using interactive technology using a HIPPA compliant audio/visual platform. This patient will be scheduled for face to face visits in the future once it has been deemed safe.      We reviewed confidentiality today. The patient was present at home (location and contact information confirmed), attended this visit alone, and consented to this virtual pain psychology visit.     Visit modifiers:   GT for Interactive Technology and CR for catastrophe/disaster related due to coronavirus pandemic    REFERRING PHYSICIAN: Clarene Essex, MD    CHIEF COMPLAINT AND REASON FOR VISIT: pain coping skills, CBT to address depression and anxiety in the setting of chronic pain    SUBJECTIVE / HISTORY OF PRESENT ILLNESS: Ms.  Maddy is a very pleasant 63 y.o.  female from [place], [place] with multiple chronic pain complaints related to fibromyalgia and lupus who initially met with me in October 2016, and which time she was diagnosed with severe depression, PTSD, panic disorder, and generalized anxiety.The patient returns for a therapy session today. Last follow up with me was 06/12/2019.    Patient participated actively throughout.  Last appointment we had processed thoughts and feelings related to systemic racism and particularly anxiety/fear for her black grandson.  Check in on this today, and continued to process feelings particularly in light of recent political climate.  Patient noted she is beginning to feel more hopeful although still has anxiety.  Practiced mindful breathing and body scan today.  Discussed how the body can go through stages of alarm, resistance, and exhaustion.  Connected to body scan and how we can be aware of our physiological state and connected to coping needs based on this.    Patient has had more pain recently.  She pulled a muscle in her low back, and she has been resting more as a result.  Walking around slowly.  Discussed coping with heat and ice alternating, light stretching, light activity, and rest.    Patient still struggling with weight loss.  Believes it is more related to being less active versus diet.  Reviewed behavioral weight loss counseling today, in light of her limited physical ability, but discussed when the muscle strain improves, and getting back to more regular walking.    OBJECTIVE / MENTAL STATUS:    Appearance:   Appears stated age and Clean/Neat   Motor:  No abnormal movements   Speech/Language:   Normal rate, volume, tone, fluency   Mood:  Depressed and Anxious   Affect:  Blunted   Thought process:  Logical, linear, clear, coherent, goal directed   Thought content:    Denies SI, HI, self harm, delusions, obsessions, paranoid ideation, or ideas of reference   Perceptual disturbances:    Denies auditory and visual hallucinations, behavior not concerning for response to internal stimuli  Orientation:  Oriented to person, place, time, and general circumstances   Attention:  Able to fully attend without fluctuations in consciousness   Concentration:  Able to fully concentrate and attend   Memory:  Immediate, short-term, long-term, and recall grossly intact    Fund of knowledge:   Consistent with level of education and development   Insight:    Fair   Judgment:   Intact Impulse Control:  Intact     DIAGNOSTIC IMPRESSION:   Post Traumatic Stress Disorder (PTSD)  Generalized Anxiety Disorder (GAD)  Panic Disorder  Major Depressive Disorder, moderate, recurrent  Chronic pain syndrome  Fibromyalgia  Lupus    ASSESSMENT:   Ms.  Cadle is a very pleasant 63 y.o.  female from [place], [place] with multiple chronic pain complaints related to lupus, arthritis, and fibromyalgia. She was previously seen in our clinic from 2012-2014, and reestablished care with Dr. Fayrene Fearing in August 2016. The patient is struggling with depression and anxiety, but is very motivated to participate in intensive, multidisciplinary treatment to address the connection between depression, anxiety and pain. She has a long-standing history of depression and anxiety, and has worked with outpatient psychiatrist and therapist over the years. She is currently established with [facility] psychiatry, and is tapering off of Klonopin.  Depression, anxiety, and panic have all improved.  Has been diagnosed with diabetes, and is managing it well with p.o. medication and dietary changes.  Has lost 50+ pounds.  Patient presents with low-grade depression related to COVID and social isolation.       PLAN:   (1) Psychotherapy - Continue CBT. CBT will be used to address PTSD, MDD, Panic D/o, GAD, and chronic pain.     --Behavioral Activation - Encouraged behavioral activation.  Is doing great, walking 5 days/week  --Downloaded and uses Insight Timer, guided relaxation app, for nightly practice.  --Continues to utilize diaphragmatic breathing daily    (2) Psychiatry - Pt currently established with [facility].      (3) Nutritionist - saw a nutritionist and found it helpful. Has lost weight, related to nausea. Exercising more too. Lost 50+ lbs in last year (off steriods for quite some time now), but gained 10 lbs during COVID. Still working on this, having a hard time, provided beh weight loss counseling today    (4) Safety - Pt denies any current or recent SI or safety concerns. Knows to call 911 or go to her local ED. Also previously given [facility].      (5) follow-up - with me monthly

## 2019-07-17 ENCOUNTER — Ambulatory Visit (INDEPENDENT_AMBULATORY_CARE_PROVIDER_SITE_OTHER): Payer: Medicare Other | Admitting: Family Medicine

## 2019-07-17 ENCOUNTER — Other Ambulatory Visit: Payer: Self-pay

## 2019-07-17 ENCOUNTER — Encounter: Payer: Self-pay | Admitting: Family Medicine

## 2019-07-17 VITALS — BP 118/84 | HR 73 | Temp 96.8°F | Resp 16 | Ht 68.0 in | Wt 210.8 lb

## 2019-07-17 DIAGNOSIS — M5441 Lumbago with sciatica, right side: Secondary | ICD-10-CM | POA: Diagnosis not present

## 2019-07-17 MED ORDER — TRAMADOL HCL 50 MG PO TABS: 50.0000 mg | ORAL_TABLET | Freq: Three times a day (TID) | ORAL | 0 refills | Status: AC | PRN

## 2019-07-17 NOTE — Progress Notes (Signed)
Name: Nicole Ballard   MRN: [id number]    DOB: [birth year]   Date:07/17/2019       Progress Note  Subjective  Chief Complaint  Chief Complaint  Patient presents with  . Back Pain    Onset-Saturday getting out of daughters vehicle and her right lower back has been bothering her since. Sharp pains and makes it hard for her to walk- states she has been taking muscle relaxer but not been helping.    HPI  Acute low back pain: she states she went for a walk 5 days ago.  Afterwards her daughter dropped her off at home and she developed acute onset of sharp pain on right lower back when getting out of the truck ( passenger seat). She has tried Tylenol, Voltaren gel and cyclobenzaprine without help. She states pain is constant, but certain movements makes it go from aching to sharp and intense. Pain is not radiating. No bowel or bladder incontinence.    Patient Active Problem List   Diagnosis Date Noted  . Hyperglycemia 10/23/2017  . Vertigo 09/29/2016  . Bronchiolitis obliterans organizing pneumonia ([place]) 09/29/2016  . Neck pain, musculoskeletal 12/15/2015  . Anxiety about health 09/29/2015  . Abnormal CAT scan 04/11/2015  . Auditory impairment 04/11/2015  . Insomnia, persistent 04/11/2015  . Chronic kidney disease (CKD), stage I 04/11/2015  . Chronic nonmalignant pain 04/11/2015  . Chronic constipation 04/11/2015  . Dyslipidemia 04/11/2015  . Fibromyalgia 04/11/2015  . Gastro-esophageal reflux disease without esophagitis 04/11/2015  . Dysphonia 04/11/2015  . Absence of bladder continence 04/11/2015  . Low back pain 04/11/2015  . Eczema intertrigo 04/11/2015  . Keratosis pilaris 04/11/2015  . Chronic recurrent major depressive disorder ([place]) 04/11/2015  . Dysmetabolic syndrome [id number]  . Neurogenic claudication 04/11/2015  . Perennial allergic rhinitis with seasonal variation 04/11/2015  . Abnormal presence of protein in urine 04/11/2015  . Seborrhea capitis 04/11/2015  .  Moderate dysplasia of vulva 04/11/2015  . Vitamin D deficiency 04/11/2015  . Treadmill stress test negative for angina pectoris 03/05/2015  . Shortness of breath 05/20/2014  . Asthma, chronic 12/31/2013  . H/O total knee replacement 12/31/2013  . Episcleritis 09/26/2013  . Vocal cord dysfunction 05/28/2013  . Anxiety, generalized 03/19/2013  . Back pain 12/18/2012  . Pulmonary nodules 11/26/2012  . Lung nodule, multiple 11/26/2012  . Arthritis, degenerative 11/01/2012  . H/O aspiration pneumonitis 10/22/2012  . Postinflammatory pulmonary fibrosis ([place]) 10/22/2012  . Mixed incontinence 05/04/2012  . Benign neoplasm of pituitary gland and craniopharyngeal duct ([place]) 12/29/2010  . Benign neoplasm of pituitary gland ([place]) 12/29/2010  . Lung involvement in systemic lupus erythematosus ([place]) 11/05/2010  . Chronic nausea 07/01/2008  . Benign hypertension 01/25/2005    Past Surgical History:  Procedure Laterality Date  . BRAIN SURGERY    . CHOLECYSTECTOMY    . VAGINAL HYSTERECTOMY      Family History  Problem Relation Age of Onset  . Breast cancer Mother   . Cancer Mother 83       Breast  . Cancer Father 34       Stomach  . Breast cancer Maternal Aunt     Social History   Socioeconomic History  . Marital status: Divorced    Spouse name: Not on file  . Number of children: 2  . Years of education: Not on file  . Highest education level: GED or equivalent  Occupational History  . Occupation: Disability  Social Needs  . Financial resource strain: Somewhat hard  .  Food insecurity    Worry: Sometimes true    Inability: Sometimes true  . Transportation needs    Medical: No    Non-medical: No  Tobacco Use  . Smoking status: Never Smoker  . Smokeless tobacco: Never Used  Substance and Sexual Activity  . Alcohol use: No    Alcohol/week: 0.0 standard drinks  . Drug use: No  . Sexual activity: Not Currently    Partners: Male  Lifestyle  . Physical activity    Days per  week: 4 days    Minutes per session: 40 min  . Stress: Not at all  Relationships  . Social Herbalist on phone: Three times a week    Gets together: Never    Attends religious service: More than 4 times per year    Active member of club or organization: Yes    Attends meetings of clubs or organizations: More than 4 times per year    Relationship status: Divorced  . Intimate partner violence    Fear of current or ex partner: No    Emotionally abused: No    Physically abused: No    Forced sexual activity: No  Other Topics Concern  . Not on file  Social History Narrative  . Not on file     Current Outpatient Medications:  .  amLODipine (NORVASC) 2.5 MG tablet, TAKE 1 TABLET BY MOUTH  DAILY, Disp: 90 tablet, Rfl: 3 .  aspirin EC 81 MG tablet, Take 1 tablet (81 mg total) by mouth daily., Disp: 30 tablet, Rfl: 0 .  atorvastatin (LIPITOR) 40 MG tablet, TAKE 1 TABLET BY MOUTH  DAILY, Disp: 90 tablet, Rfl: 3 .  Biotin 1 MG CAPS, Take 1 capsule by mouth daily., Disp: 30 capsule, Rfl: 0 .  buPROPion (WELLBUTRIN XL) 150 MG 24 hr tablet, Take 1 tablet (150 mg total) by mouth daily., Disp: 30 tablet, Rfl: 0 .  Cholecalciferol (VITAMIN D) 2000 UNITS CAPS, Take 1 capsule by mouth daily., Disp: , Rfl:  .  clonazePAM (KLONOPIN) 0.5 MG tablet, Take 0.5 tablets (0.25 mg total) by mouth daily., Disp: 15 tablet, Rfl: 0 .  cyclobenzaprine (FLEXERIL) 5 MG tablet, TAKE 1 TABLET BY MOUTH  EVERY 8 HOURS AS NEEDED FOR MUSCLE SPASMS. DO NOT DRIVE FOR 8 HOURS, Disp: 90 tablet, Rfl: 1 .  diclofenac sodium (VOLTAREN) 1 % GEL, APP 2 GRAMS EXT AA QID, Disp: , Rfl: 6 .  DULoxetine (CYMBALTA) 60 MG capsule, Take 120 mg by mouth at bedtime. , Disp: , Rfl:  .  fluticasone (FLONASE) 50 MCG/ACT nasal spray, Place 2 sprays into both nostrils daily., Disp: 48 g, Rfl: 3 .  gabapentin (NEURONTIN) 300 MG capsule, Take 1 capsule by mouth at bedtime., Disp: , Rfl:  .  halobetasol (ULTRAVATE) 0.05 % ointment, Apply  topically., Disp: , Rfl:  .  hydroxychloroquine (PLAQUENIL) 200 MG tablet, Take 200 mg by mouth 2 (two) times daily., Disp: , Rfl:  .  hydrOXYzine (ATARAX/VISTARIL) 10 MG tablet, Take 1 tablet (10 mg total) by mouth every 6 (six) hours as needed., Disp: 60 tablet, Rfl: 0 .  lamoTRIgine (LAMICTAL) 200 MG tablet, Take 1 tablet (200 mg total) by mouth 2 (two) times daily., Disp: 60 tablet, Rfl: 0 .  meclizine (ANTIVERT) 25 MG tablet, Take 1 tablet (25 mg total) by mouth 3 (three) times daily as needed., Disp: 30 tablet, Rfl: 0 .  mycophenolate (MYFORTIC) 360 MG TBEC EC tablet, Take 360 mg by mouth  2 (two) times daily., Disp: , Rfl:  .  olopatadine (PATANOL) 0.1 % ophthalmic solution, Place 1 drop into both eyes 2 (two) times daily., Disp: 5 mL, Rfl: 3 .  omeprazole (PRILOSEC) 40 MG capsule, TAKE 1 CAPSULE BY MOUTH  DAILY, Disp: 90 capsule, Rfl: 3 .  ondansetron (ZOFRAN) 4 MG tablet, TAKE 1 TABLET BY MOUTH  EVERY 8 HOURS AS NEEDED FOR NAUSEA AND VOMITING, Disp: 60 tablet, Rfl: 0 .  Polyethylene Glycol 3350 (MIRALAX PO), Take by mouth., Disp: , Rfl:  .  predniSONE (DELTASONE) 10 MG tablet, Take 1 tablet (10 mg total) by mouth 2 (two) times daily with a meal., Disp: 10 tablet, Rfl: 0 .  telmisartan (MICARDIS) 40 MG tablet, Take 1 tablet (40 mg total) by mouth daily., Disp: 90 tablet, Rfl: 1 .  traZODone (DESYREL) 100 MG tablet, Take 200 mg by mouth at bedtime. , Disp: , Rfl:  .  triamcinolone ointment (KENALOG) 0.1 %, , Disp: , Rfl:   Allergies  Allergen Reactions  . Ace Inhibitors     Other reaction(s): OTHER Pt states she can not take ace inhibitors. Pt states she can not remember her reaction.   . Bee Pollen   . Penicillins   . Pollen Extract     I personally reviewed active problem list, medication list, allergies, family history, social history with the patient/caregiver today.   ROS  Constitutional: Negative for fever or weight change.  Respiratory: Negative for cough, she has stable   shortness of breath.   Cardiovascular: Negative for chest pain or palpitations.  Gastrointestinal: Negative for abdominal pain, no bowel changes.  Musculoskeletal: positive for gait problem - needs to lean forward while walking because of pain, no joint swelling.  Skin: Negative for rash.  Neurological: Negative for dizziness or headache.  No other specific complaints in a complete review of systems (except as listed in HPI above).  Objective  Vitals:   07/17/19 0848  BP: 118/84  Pulse: 73  Resp: 16  Temp: (!) 96.8 F (36 C)  TempSrc: Temporal  SpO2: 98%  Weight: 210 lb 12.8 oz (95.6 kg)  Height: 5\' 8"  (1.727 m)    Body mass index is 32.05 kg/m.  Physical Exam  Constitutional: Patient appears well-developed and well-nourished. Obese  No distress.  HEENT: head atraumatic, normocephalic, pupils equal and reactive to light Cardiovascular: Normal rate, regular rhythm and normal heart sounds.  No murmur heard. No BLE edema. Pulmonary/Chest: Effort normal and breath sounds normal. No respiratory distress. Abdominal: Soft.  There is no tenderness. Psychiatric: Patient has a normal mood and affect. behavior is normal. Judgment and thought content normal.  Muscular Skeletal: tender during palpation of lumbar spine, negative straight leg raise, pain with rom in all directions, slow gait   Recent Results (from the past 2160 hour(s))  POCT HgB A1C     Status: Normal   Collection Time: 06/12/19  9:47 AM  Result Value Ref Range   Hemoglobin A1C 5.4 4.0 - 5.6 %   HbA1c POC (<> result, manual entry)     HbA1c, POC (prediabetic range)     HbA1c, POC (controlled diabetic range)    Novel Coronavirus, NAA ([facility])     Status: None   Collection Time: 07/05/19 12:00 AM   Specimen: Oropharyngeal(OP) collection in vial transport medium   OROPHARYNGEA  TESTING  Result Value Ref Range   SARS-CoV-2, NAA Not Detected Not Detected    Comment: Testing was performed using the cobas(R) SARS-CoV-2  test. This nucleic acid amplification test was developed and its performance characteristics determined by [employer]. Nucleic acid amplification tests include PCR and TMA. This test has not been FDA cleared or approved. This test has been authorized by FDA under an Emergency Use Authorization (EUA). This test is only authorized for the duration of time the declaration that circumstances exist justifying the authorization of the emergency use of in vitro diagnostic tests for detection of SARS-CoV-2 virus and/or diagnosis of COVID-19 infection under section 564(b)(1) of the Act, 21 U.S.C. [id number]) (1), unless the authorization is terminated or revoked sooner. When diagnostic testing is negative, the possibility of a false negative result should be considered in the context of a patient's recent exposures and the presence of clinical signs and symptoms consistent with COVID-19. An individual without symptoms  of COVID-19 and who is not shedding SARS-CoV-2 virus would expect to have a negative (not detected) result in this assay.    Diabetic Foot Exam - Simple   Simple Foot Form Diabetic Foot exam was performed with the following findings: Yes 07/17/2019  9:13 AM  Visual Inspection No deformities, no ulcerations, no other skin breakdown bilaterally: Yes Sensation Testing See comments: Yes Pulse Check Posterior Tibialis and Dorsalis pulse intact bilaterally: Yes Comments Failed monofilament test      PHQ2/9: Depression screen [facility] 2/9 07/17/2019 06/10/2019 04/19/2019 03/25/2019 02/07/2019  Decreased Interest 1 1 1  0 0  Down, Depressed, Hopeless 0 2 3 0 0  PHQ - 2 Score 1 3 4  0 0  Altered sleeping 0 3 2 0 0  Tired, decreased energy 0 1 2 0 0  Change in appetite 0 2 1 0 0  Feeling bad or failure about yourself  0 3 0 0 0  Trouble concentrating 0 0 2 0 0  Moving slowly or fidgety/restless 0 2 0 0 0  Suicidal thoughts 0 0 0 0 0  PHQ-9 Score 1 14 11  0 0  Difficult doing  work/chores Not difficult at all Very difficult Somewhat difficult - Not difficult at all  Some recent data might be hidden    phq 9 is negative   Fall Risk: Fall Risk  07/17/2019 06/10/2019 04/19/2019 03/25/2019 02/07/2019  Falls in the past year? 1 1 1 1 1   Number falls in past yr: 1 1 1 1 1   Comment - - - - -  Injury with Fall? 0 0 0 1 0  Risk for fall due to : Impaired balance/gait Impaired balance/gait Impaired balance/gait History of fall(s) -  Risk for fall due to: Comment - - - - -  Follow up - - - - -     Functional Status Survey: Is the patient deaf or have difficulty hearing?: No Does the patient have difficulty seeing, even when wearing glasses/contacts?: Yes Does the patient have difficulty concentrating, remembering, or making decisions?: No Does the patient have difficulty walking or climbing stairs?: Yes Does the patient have difficulty dressing or bathing?: No Does the patient have difficulty doing errands alone such as visiting a doctor's office or shopping?: No    Assessment & Plan  1. Acute right-sided low back pain with right-sided sciatica  - traMADol (ULTRAM) 50 MG tablet; Take 1 tablet (50 mg total) by mouth every 8 (eight) hours as needed for up to 5 days.  Dispense: 15 tablet; Refill: 0  Also discussed PT versus chiropractor care and she chose the later

## 2019-07-17 NOTE — Patient Instructions (Signed)
Acute Back Pain, Adult Acute back pain is sudden and usually short-lived. It is often caused by an injury to the muscles and tissues in the back. The injury may result from:  A muscle or ligament getting overstretched or torn (strained). Ligaments are tissues that connect bones to each other. Lifting something improperly can cause a back strain.  Wear and tear (degeneration) of the spinal disks. Spinal disks are circular tissue that provides cushioning between the bones of the spine (vertebrae).  Twisting motions, such as while playing sports or doing yard work.  A hit to the back.  Arthritis. You may have a physical exam, lab tests, and imaging tests to find the cause of your pain. Acute back pain usually goes away with rest and home care. Follow these instructions at home: Managing pain, stiffness, and swelling  Take over-the-counter and prescription medicines only as told by your health care provider.  Your health care provider may recommend applying ice during the first 24-48 hours after your pain starts. To do this: ? Put ice in a plastic bag. ? Place a towel between your skin and the bag. ? Leave the ice on for 20 minutes, 2-3 times a day.  If directed, apply heat to the affected area as often as told by your health care provider. Use the heat source that your health care provider recommends, such as a moist heat pack or a heating pad. ? Place a towel between your skin and the heat source. ? Leave the heat on for 20-30 minutes. ? Remove the heat if your skin turns bright red. This is especially important if you are unable to feel pain, heat, or cold. You have a greater risk of getting burned. Activity   Do not stay in bed. Staying in bed for more than 1-2 days can delay your recovery.  Sit up and stand up straight. Avoid leaning forward when you sit, or hunching over when you stand. ? If you work at a desk, sit close to it so you do not need to lean over. Keep your chin tucked  in. Keep your neck drawn back, and keep your elbows bent at a right angle. Your arms should look like the letter "L." ? Sit high and close to the steering wheel when you drive. Add lower back (lumbar) support to your car seat, if needed.  Take short walks on even surfaces as soon as you are able. Try to increase the length of time you walk each day.  Do not sit, drive, or stand in one place for more than 30 minutes at a time. Sitting or standing for long periods of time can put stress on your back.  Do not drive or use heavy machinery while taking prescription pain medicine.  Use proper lifting techniques. When you bend and lift, use positions that put less stress on your back: ? Bend your knees. ? Keep the load close to your body. ? Avoid twisting.  Exercise regularly as told by your health care provider. Exercising helps your back heal faster and helps prevent back injuries by keeping muscles strong and flexible.  Work with a physical therapist to make a safe exercise program, as recommended by your health care provider. Do any exercises as told by your physical therapist. Lifestyle  Maintain a healthy weight. Extra weight puts stress on your back and makes it difficult to have good posture.  Avoid activities or situations that make you feel anxious or stressed. Stress and anxiety increase muscle   tension and can make back pain worse. Learn ways to manage anxiety and stress, such as through exercise. General instructions  Sleep on a firm mattress in a comfortable position. Try lying on your side with your knees slightly bent. If you lie on your back, put a pillow under your knees.  Follow your treatment plan as told by your health care provider. This may include: ? Cognitive or behavioral therapy. ? Acupuncture or massage therapy. ? Meditation or yoga. Contact a health care provider if:  You have pain that is not relieved with rest or medicine.  You have increasing pain going down  into your legs or buttocks.  Your pain does not improve after 2 weeks.  You have pain at night.  You lose weight without trying.  You have a fever or chills. Get help right away if:  You develop new bowel or bladder control problems.  You have unusual weakness or numbness in your arms or legs.  You develop nausea or vomiting.  You develop abdominal pain.  You feel faint. Summary  Acute back pain is sudden and usually short-lived.  Use proper lifting techniques. When you bend and lift, use positions that put less stress on your back.  Take over-the-counter and prescription medicines and apply heat or ice as directed by your health care provider. This information is not intended to replace advice given to you by your health care provider. Make sure you discuss any questions you have with your health care provider. Document Released: 08/22/2005 Document Revised: 12/11/2018 Document Reviewed: 04/05/2017 Elsevier Patient Education  2020 [employer]  

## 2019-07-19 ENCOUNTER — Other Ambulatory Visit: Payer: Self-pay | Admitting: Family Medicine

## 2019-07-19 DIAGNOSIS — I10 Essential (primary) hypertension: Secondary | ICD-10-CM

## 2019-07-24 ENCOUNTER — Other Ambulatory Visit: Payer: Self-pay

## 2019-07-24 NOTE — Patient Outreach (Signed)
[place] [facility]) Care Management  07/24/2019  [place] 1955/11/19 [id number]   Medication Adherence call to Mrs. [place] Compliant Voice message left with a call back number. Mrs. Woehr is showing past due on Metformin Er 500 mg under [place].   [place] Management Direct Dial [phone]  Fax [phone] [email or web address]

## 2019-07-28 NOTE — Unmapped (Signed)
Messaged Caitlin Smith about request for clonazepam. There should be 2 refills available. Sent her mychart message to make sure and will reorder if needed.

## 2019-08-07 ENCOUNTER — Other Ambulatory Visit: Payer: Self-pay

## 2019-08-07 NOTE — Patient Outreach (Signed)
[place] [facility]) Care Management  08/07/2019  [place] 07/02/1956 [id number]   Medication Adherence call to Mrs. [place] Compliant Voice message left with a call back number. Mrs. Cronin is showing past due on Metformin Er 500 mg under [place].   [place] Management Direct Dial [phone]  Fax [phone] [email or web address]

## 2019-08-07 NOTE — Unmapped (Signed)
[facility] Refill Coordination Note    Specialty Medication(s) to be Shipped:   Inflammatory Disorders: mycophenolate    Other medication(s) to be shipped: n/a     Caitlin Smith, DOB: [birth year]  Phone: [phone] (home)       All above HIPAA information was verified with patient.     Completed refill call assessment today to schedule patient's medication shipment from the [facility] [phone]).       Specialty medication(s) and dose(s) confirmed: Regimen is correct and unchanged.   Changes to medications: [facility] reports no changes at this time.  Changes to insurance: No  Questions for the pharmacist: No    Confirmed patient received Welcome Packet with first shipment. The patient will receive a drug information handout for each medication shipped and additional FDA Medication Guides as required.       DISEASE/MEDICATION-SPECIFIC INFORMATION        N/A    SPECIALTY MEDICATION ADHERENCE     Medication Adherence    Patient reported X missed doses in the last month: 0  Specialty Medication: Myfortic  Patient is on additional specialty medications: No  Patient is on more than two specialty medications: No  Any gaps in refill history greater than 2 weeks in the last 3 months: no  Demonstrates understanding of importance of adherence: yes  Informant: patient                Mycophenolate 360mg : Patient has 14 days of medication on hand        SHIPPING     Shipping address confirmed in Epic.     Delivery Scheduled: Yes, Expected medication delivery date: 08/20/19.     Medication will be delivered via Next Day Courier to the prescription address in Epic WAM.    Olga Millers   [facility] Specialty Technician

## 2019-08-09 ENCOUNTER — Ambulatory Visit (INDEPENDENT_AMBULATORY_CARE_PROVIDER_SITE_OTHER): Payer: Medicare Other

## 2019-08-09 DIAGNOSIS — Z0000 Encounter for general adult medical examination without abnormal findings: Secondary | ICD-10-CM | POA: Diagnosis not present

## 2019-08-09 NOTE — Progress Notes (Signed)
Subjective:   Nicole Ballard is a 63 y.o. female who presents for an Initial Medicare Annual Wellness Visit.  Virtual Visit via Telephone Note  I connected with Nicole Ballard on 08/09/19 at  9:20 AM EST by telephone and verified that I am speaking with the correct person using two identifiers.  Medicare Annual Wellness visit completed telephonically due to Covid-19 pandemic.   Location: Patient: home Provider: office   I discussed the limitations, risks, security and privacy concerns of performing an evaluation and management service by telephone and the availability of in person appointments. The patient expressed understanding and agreed to proceed.  Some vital signs may be absent or patient reported.   Clemetine Marker, LPN    Review of Systems      Cardiac Risk Factors include: dyslipidemia;hypertension;advanced age (>7mn, >>73women)     Objective:    There were no vitals filed for this visit. There is no height or weight on file to calculate BMI.  Advanced Directives 08/09/2019 01/21/2019 04/23/2018 11/09/2017 06/29/2017 06/23/2017 05/17/2017  Does Patient Have a Medical Advance Directive? _0  No No  Would patient like information on creating a medical advance directive? Yes (MAU/Ambulatory/Procedural Areas - Information given) No - Patient declined No - Patient declined No - Patient declined - - -    Current Medications (verified) Outpatient Encounter Medications as of 08/09/2019  Medication Sig  . amLODipine (NORVASC) 2.5 MG tablet TAKE 1 TABLET BY MOUTH  DAILY  . aspirin EC 81 MG tablet Take 1 tablet (81 mg total) by mouth daily.  .Marland Kitchenatorvastatin (LIPITOR) 40 MG tablet TAKE 1 TABLET BY MOUTH  DAILY  . Biotin 1 MG CAPS Take 1 capsule by mouth daily.  .Marland KitchenbuPROPion (WELLBUTRIN XL) 150 MG 24 hr tablet Take 1 tablet (150 mg total) by mouth daily.  . clonazePAM (KLONOPIN) 0.5 MG tablet Take 0.5 tablets (0.25 mg total) by mouth daily.  . diclofenac sodium  (VOLTAREN) 1 % GEL APP 2 GRAMS EXT AA QID  . DULoxetine (CYMBALTA) 60 MG capsule Take 120 mg by mouth at bedtime.   . fluticasone (FLONASE) 50 MCG/ACT nasal spray Place 2 sprays into both nostrils daily.  .Marland Kitchengabapentin (NEURONTIN) 300 MG capsule Take 1 capsule by mouth at bedtime.  . halobetasol (ULTRAVATE) 0.05 % ointment Apply topically.  . hydroxychloroquine (PLAQUENIL) 200 MG tablet Take 200 mg by mouth 2 (two) times daily.  . hydrOXYzine (ATARAX/VISTARIL) 10 MG tablet Take 1 tablet (10 mg total) by mouth every 6 (six) hours as needed.  . lamoTRIgine (LAMICTAL) 200 MG tablet Take 1 tablet (200 mg total) by mouth 2 (two) times daily.  . meclizine (ANTIVERT) 25 MG tablet Take 1 tablet (25 mg total) by mouth 3 (three) times daily as needed.  . Melatonin 10 MG TABS Take 1 tablet by mouth at bedtime.  . mycophenolate (MYFORTIC) 360 MG TBEC EC tablet Take 360 mg by mouth 2 (two) times daily.  .Marland Kitchenolopatadine (PATANOL) 0.1 % ophthalmic solution Place 1 drop into both eyes 2 (two) times daily.  .Marland Kitchenomeprazole (PRILOSEC) 40 MG capsule TAKE 1 CAPSULE BY MOUTH  DAILY  . Polyethylene Glycol 3350 (MIRALAX PO) Take by mouth.  . telmisartan (MICARDIS) 40 MG tablet TAKE 1 TABLET BY MOUTH  DAILY  . traZODone (DESYREL) 100 MG tablet Take 200 mg by mouth at bedtime.   . Cholecalciferol (VITAMIN D) 2000 UNITS CAPS Take 1 capsule by mouth daily.  . cyclobenzaprine (FLEXERIL) 5  MG tablet TAKE 1 TABLET BY MOUTH  EVERY 8 HOURS AS NEEDED FOR MUSCLE SPASMS. DO NOT DRIVE FOR 8 HOURS (Patient not taking: Reported on 08/09/2019)  . ondansetron (ZOFRAN) 4 MG tablet TAKE 1 TABLET BY MOUTH  EVERY 8 HOURS AS NEEDED FOR NAUSEA AND VOMITING (Patient not taking: Reported on 08/09/2019)  . triamcinolone ointment (KENALOG) 0.1 %   . [DISCONTINUED] predniSONE (DELTASONE) 10 MG tablet Take 1 tablet (10 mg total) by mouth 2 (two) times daily with a meal.   No facility-administered encounter medications on file as of 08/09/2019.      Allergies (verified) Ace inhibitors, Bee pollen, Penicillins, and Pollen extract   History: Past Medical History:  Diagnosis Date  . Anxiety   . Bipolar 1 disorder ([place])   . Chronic kidney disease   . Chronic pain   . Diabetes mellitus without complication ([place])   . Fibromyalgia   . GERD (gastroesophageal reflux disease)   . Hearing loss of right ear   . Hemifacial spasm   . Hyperlipidemia   . Hypertension   . Insomnia   . Lumbago   . Osteoarthritis   . Paresthesia   . Rhinitis   . Systemic lupus erythematosus ([place])   . Vitamin D deficiency    Past Surgical History:  Procedure Laterality Date  . BRAIN SURGERY    . CHOLECYSTECTOMY    . VAGINAL HYSTERECTOMY     Family History  Problem Relation Age of Onset  . Breast cancer Mother   . Cancer Mother 19       Breast  . Cancer Father 59       Stomach  . Breast cancer Maternal Aunt    Social History   Socioeconomic History  . Marital status: Divorced    Spouse name: Not on file  . Number of children: 2  . Years of education: Not on file  . Highest education level: GED or equivalent  Occupational History  . Occupation: Disability  Social Needs  . Financial resource strain: Somewhat hard  . Food insecurity    Worry: Never true    Inability: Never true  . Transportation needs    Medical: No    Non-medical: No  Tobacco Use  . Smoking status: Never Smoker  . Smokeless tobacco: Never Used  Substance and Sexual Activity  . Alcohol use: No    Alcohol/week: 0.0 standard drinks  . Drug use: No  . Sexual activity: Not Currently    Partners: Male  Lifestyle  . Physical activity    Days per week: 0 days    Minutes per session: 0 min  . Stress: Rather much  Relationships  . Social Herbalist on phone: Three times a week    Gets together: Never    Attends religious service: More than 4 times per year    Active member of club or organization: Yes    Attends meetings of clubs or organizations: More  than 4 times per year    Relationship status: Divorced  Other Topics Concern  . Not on file  Social History Narrative  . Not on file    Tobacco Counseling Counseling given: Not Answered   Clinical Intake:  Pre-visit preparation completed: Yes  Pain : No/denies pain     Nutritional Risks: None Diabetes: No  How often do you need to have someone help you when you read instructions, pamphlets, or other written materials from your doctor or pharmacy?: 1 - Never  Interpreter Needed?: No  Information entered by :: Clemetine Marker LPN   Activities of Daily Living In your present state of health, do you have any difficulty performing the following activities: 08/09/2019 07/17/2019  Hearing? Y N  Comment deaf in right ear -  Vision? N Y  Difficulty concentrating or making decisions? Y N  Walking or climbing stairs? Y Y  Dressing or bathing? N N  Doing errands, shopping? N N  Preparing Food and eating ? N -  Using the Toilet? N -  In the past six months, have you accidently leaked urine? Y -  Do you have problems with loss of bowel control? N -  Managing your Medications? N -  Managing your Finances? N -  Housekeeping or managing your Housekeeping? N -  Some recent data might be hidden     Immunizations and Health Maintenance Immunization History  Administered Date(s) Administered  . Hepatitis A, Adult 08/12/2015, 02/10/2016  . Hepatitis B, adult 06/26/2007, 08/12/2015, 10/13/2015, 02/10/2016  . Influenza Split 06/01/2010, 05/14/2013, 04/19/2014  . Influenza, Seasonal, Injecte, Preservative Fre 06/26/2007, 06/02/2011, 12/01/2011, 06/09/2013  . Influenza,inj,Quad PF,6+ Mos 04/16/2015, 05/26/2016, 05/17/2017, 05/14/2018, 06/12/2019  . MMR 06/26/2007  . Pneumococcal Conjugate-13 08/06/2015, 05/26/2016  . Pneumococcal Polysaccharide-23 02/22/2010, 08/04/2016  . Tdap 06/26/2007, 02/22/2010   There are no preventive care reminders to display for this patient.  Patient Care  Team: Steele Sizer, MD as PCP - General (Family Medicine) Blima Dessert, MD as Referring Physician (Rheumatology) Dorinsky, Carey Bullocks, MD as Consulting Physician (Pulmonary Disease) Jodell Cipro, MD as Referring Physician (Anesthesiology) Justin Mend, MD as Attending Physician (Nephrology) Dimple Nanas, MD as Referring Physician (Gastroenterology)  Indicate any recent Medical Services you may have received from other than [facility] providers in the past year (date may be approximate).     Assessment:   This is a routine wellness examination for Judianne.  Hearing/Vision screen  Hearing Screening   _0  _1  _2  _3  _4  _5  _6  _7  _8   Right ear:           Left ear:           Comments: Pt c/o being deaf in her right ear  Vision Screening Comments: [facility] screen[facility]   Dietary issues and exercise activities discussed: Current Exercise Habits: The patient does not participate in regular exercise at present, Exercise limited by: orthopedic condition(s)  Goals    . I need to see a dentist but I just can't afford all the work (pt-stated)     Current Barriers:  Marland Kitchen Knowledge Deficits related to available dental clinics in the area . Knowledge Deficits related to dental benefits through her current [facility] Medicare plan . Film/video editor.   Nurse Case Manager Clinical Goal(s):  Marland Kitchen Over the next 14 days, patient will contact resources provided to get a quote on dental work needed- goal met 04/12/2019 . Over the next 14 days, patient will contact resources provided for ongoing dental care/cleanings  Interventions:   . Provided patient with a list of [place] in network dental providers in the [facility] area  [place], [place] [address] Phone: [phone]  [facility] [address] [place], [place] [address] Phone: [phone]  [place] [facility]  [place], [place] [address] Phone: [phone]  [place] [facility] [address] [place], [place] [address] Phone: [phone]   Patient Self Care  Activities:  . Call and schedule dental exam . Take all medications as prescribed . Attend all medical and dental appointment   Initial goal documentation       Depression Screen PHQ 2/9 Scores 08/09/2019 07/17/2019 06/10/2019 04/19/2019 03/25/2019 02/07/2019 12/11/2018  PHQ - 2 Score 0 _0 0 0 0  PHQ- 9 Score _1 0 0 0    Fall Risk Fall Risk  08/09/2019 07/17/2019 06/10/2019 04/19/2019 03/25/2019  Falls in the past year? 0 _2 Number falls in past yr: 0 _3 Comment - - - - -  Injury with Fall? 0 0 0 0 1  Risk for fall due to : Impaired balance/gait Impaired balance/gait Impaired balance/gait Impaired balance/gait History of fall(s)  Risk for fall due to: Comment - - - - -  Follow up Falls prevention discussed - - - -   FALL RISK PREVENTION PERTAINING TO THE HOME:  Any stairs in or around the home? No  If so, do they handrails? No   Home free of loose throw rugs in walkways, pet beds, electrical cords, etc? Yes  Adequate lighting in your home to reduce risk of falls? Yes   ASSISTIVE DEVICES UTILIZED TO PREVENT FALLS:  Life alert? No  Use of a cane, walker or w/c? Yes  - has a cane but doesn't use it often Grab bars in the bathroom? No  Shower chair or bench in shower? No  Elevated toilet seat or a handicapped toilet? No   DME ORDERS:  DME order needed?  No   TIMED UP AND GO:  Was the test performed? No . Telephonic visit.   Education: Fall risk prevention has been discussed.  Intervention(s) required? No   Cognitive Function:     6CIT Screen 08/09/2019  What Year? 0 points  What month? 0 points  What time? 0 points  Count back from 20 0 points  Months in reverse 2 points  Repeat phrase 0 points  Total Score 2    Screening Tests Health Maintenance  Topic Date Due  .  HEMOGLOBIN A1C  12/11/2019  . TETANUS/TDAP  02/23/2020  . MAMMOGRAM  03/20/2020  . OPHTHALMOLOGY EXAM  06/17/2020  . FOOT EXAM  07/16/2020  . PAP SMEAR-Modifier  03/29/2021  . COLONOSCOPY  08/24/2023  . INFLUENZA VACCINE  Completed  . PNEUMOCOCCAL POLYSACCHARIDE VACCINE AGE 11-64 HIGH RISK  Completed  . Hepatitis C Screening  Completed  . HIV Screening  Completed    Qualifies for Shingles Vaccine? Yes . Due for Shingrix. Education has been provided regarding the importance of this vaccine. Pt has been advised to call insurance company to determine out of pocket expense. Advised may also receive vaccine at local pharmacy or Health Dept. Verbalized acceptance and understanding.  Tdap: Up to date  Flu Vaccine: Up to date  Pneumococcal Vaccine: Up to date   Cancer Screenings:  Colorectal Screening: Completed 08/23/13. Repeat every 10 years;   Mammogram: Completed 03/21/19. Repeat every year.   Bone Density: Completed 12/14/17. Results reflect NORMAL. Repeat every 2 years.    Lung Cancer Screening: (Low Dose CT Chest recommended if Age 73-80 years, 30 pack-year currently smoking OR have quit w/in 15years.) does not qualify.    Additional Screening:  Hepatitis C Screening: does not qualify;  Vision Screening: Recommended annual ophthalmology exams for early detection of glaucoma and other disorders of the eye. Is the patient up to date with their annual  eye exam?  Yes  Who is the provider or what is the name of the office in which the pt attends annual eye exams? [facility]   Dental Screening: Recommended annual dental exams for proper oral hygiene  Community Resource Referral:  CRR required this visit?  No      Plan:    I have personally reviewed and addressed the Medicare Annual Wellness questionnaire and have noted the following in the patient's chart:  A. Medical and social history B. Use of alcohol, tobacco or illicit drugs  C. Current medications and supplements D.  Functional ability and status E.  Nutritional status F.  Physical activity G. Advance directives H. List of other physicians I.  Hospitalizations, surgeries, and ER visits in previous 12 months J.  [place] such as hearing and vision if needed, cognitive and depression L. Referrals and appointments   In addition, I have reviewed and discussed with patient certain preventive protocols, quality metrics, and best practice recommendations. A written personalized care plan for preventive services as well as general preventive health recommendations were provided to patient.   Signed,  Clemetine Marker, LPN Nurse Health Advisor   Nurse Notes: none

## 2019-08-09 NOTE — Patient Instructions (Signed)
Nicole Ballard , Thank you for taking time to come for your Medicare Wellness Visit. I appreciate your ongoing commitment to your health goals. Please review the following plan we discussed and let me know if I can assist you in the future.   Screening recommendations/referrals: Colonoscopy: done 08/23/13. Repeat in 2024. Mammogram: done 03/21/19 Bone Density: done 12/14/17 Recommended yearly ophthalmology/optometry visit for glaucoma screening and checkup Recommended yearly dental visit for hygiene and checkup  Vaccinations: Influenza vaccine: done 06/12/19 Pneumococcal vaccine: done 08/04/16 Tdap vaccine: done 02/22/10 Shingles vaccine: Shingrix discussed. Please contact your pharmacy for coverage information.   Advanced directives: Advance directive discussed with you today. I have provided a copy for you to complete at home and have notarized. Once this is complete please bring a copy in to our office so we can scan it into your chart.  Conditions/risks identified: Recommend increasing physical activity  Next appointment: Please follow up in one year for your Medicare Annual Wellness visit.    Preventive Care 40-64 Years, Female Preventive care refers to lifestyle choices and visits with your health care provider that can promote health and wellness. What does preventive care include?  A yearly physical exam. This is also called an annual well check.  Dental exams once or twice a year.  Routine eye exams. Ask your health care provider how often you should have your eyes checked.  Personal lifestyle choices, including:  Daily care of your teeth and gums.  Regular physical activity.  Eating a healthy diet.  Avoiding tobacco and drug use.  Limiting alcohol use.  Practicing safe sex.  Taking low-dose aspirin daily starting at age 57.  Taking vitamin and mineral supplements as recommended by your health care provider. What happens during an annual well check? The services  and screenings done by your health care provider during your annual well check will depend on your age, overall health, lifestyle risk factors, and family history of disease. Counseling  Your health care provider may ask you questions about your:  Alcohol use.  Tobacco use.  Drug use.  Emotional well-being.  Home and relationship well-being.  Sexual activity.  Eating habits.  Work and work Statistician.  Method of birth control.  Menstrual cycle.  Pregnancy history. Screening  You may have the following tests or measurements:  Height, weight, and BMI.  Blood pressure.  Lipid and cholesterol levels. These may be checked every 5 years, or more frequently if you are over 58 years old.  Skin check.  Lung cancer screening. You may have this screening every year starting at age 66 if you have a 30-pack-year history of smoking and currently smoke or have quit within the past 15 years.  Fecal occult blood test (FOBT) of the stool. You may have this test every year starting at age 39.  Flexible sigmoidoscopy or colonoscopy. You may have a sigmoidoscopy every 5 years or a colonoscopy every 10 years starting at age 6.  Hepatitis C blood test.  Hepatitis B blood test.  Sexually transmitted disease (STD) testing.  Diabetes screening. This is done by checking your blood sugar (glucose) after you have not eaten for a while (fasting). You may have this done every 1-3 years.  Mammogram. This may be done every 1-2 years. Talk to your health care provider about when you should start having regular mammograms. This may depend on whether you have a family history of breast cancer.  BRCA-related cancer screening. This may be done if you have a family history  of breast, ovarian, tubal, or peritoneal cancers.  Pelvic exam and Pap test. This may be done every 3 years starting at age 32. Starting at age 42, this may be done every 5 years if you have a Pap test in combination with an HPV  test.  Bone density scan. This is done to screen for osteoporosis. You may have this scan if you are at high risk for osteoporosis. Discuss your test results, treatment options, and if necessary, the need for more tests with your health care provider. Vaccines  Your health care provider may recommend certain vaccines, such as:  Influenza vaccine. This is recommended every year.  Tetanus, diphtheria, and acellular pertussis (Tdap, Td) vaccine. You may need a Td booster every 10 years.  Zoster vaccine. You may need this after age 46.  Pneumococcal 13-valent conjugate (PCV13) vaccine. You may need this if you have certain conditions and were not previously vaccinated.  Pneumococcal polysaccharide (PPSV23) vaccine. You may need one or two doses if you smoke cigarettes or if you have certain conditions. Talk to your health care provider about which screenings and vaccines you need and how often you need them. This information is not intended to replace advice given to you by your health care provider. Make sure you discuss any questions you have with your health care provider. Document Released: 09/18/2015 Document Revised: 05/11/2016 Document Reviewed: 06/23/2015 Elsevier Interactive Patient Education  2017 [place] Prevention in the Home Falls can cause injuries. They can happen to people of all ages. There are many things you can do to make your home safe and to help prevent falls. What can I do on the outside of my home?  Regularly fix the edges of walkways and driveways and fix any cracks.  Remove anything that might make you trip as you walk through a door, such as a raised step or threshold.  Trim any bushes or trees on the path to your home.  Use bright outdoor lighting.  Clear any walking paths of anything that might make someone trip, such as rocks or tools.  Regularly check to see if handrails are loose or broken. Make sure that both sides of any steps have  handrails.  Any raised decks and porches should have guardrails on the edges.  Have any leaves, snow, or ice cleared regularly.  Use sand or salt on walking paths during winter.  Clean up any spills in your garage right away. This includes oil or grease spills. What can I do in the bathroom?  Use night lights.  Install grab bars by the toilet and in the tub and shower. Do not use towel bars as grab bars.  Use non-skid mats or decals in the tub or shower.  If you need to sit down in the shower, use a plastic, non-slip stool.  Keep the floor dry. Clean up any water that spills on the floor as soon as it happens.  Remove soap buildup in the tub or shower regularly.  Attach bath mats securely with double-sided non-slip rug tape.  Do not have throw rugs and other things on the floor that can make you trip. What can I do in the bedroom?  Use night lights.  Make sure that you have a light by your bed that is easy to reach.  Do not use any sheets or blankets that are too big for your bed. They should not hang down onto the floor.  Have a firm chair that  has side arms. You can use this for support while you get dressed.  Do not have throw rugs and other things on the floor that can make you trip. What can I do in the kitchen?  Clean up any spills right away.  Avoid walking on wet floors.  Keep items that you use a lot in easy-to-reach places.  If you need to reach something above you, use a strong step stool that has a grab bar.  Keep electrical cords out of the way.  Do not use floor polish or wax that makes floors slippery. If you must use wax, use non-skid floor wax.  Do not have throw rugs and other things on the floor that can make you trip. What can I do with my stairs?  Do not leave any items on the stairs.  Make sure that there are handrails on both sides of the stairs and use them. Fix handrails that are broken or loose. Make sure that handrails are as long as  the stairways.  Check any carpeting to make sure that it is firmly attached to the stairs. Fix any carpet that is loose or worn.  Avoid having throw rugs at the top or bottom of the stairs. If you do have throw rugs, attach them to the floor with carpet tape.  Make sure that you have a light switch at the top of the stairs and the bottom of the stairs. If you do not have them, ask someone to add them for you. What else can I do to help prevent falls?  Wear shoes that:  Do not have high heels.  Have rubber bottoms.  Are comfortable and fit you well.  Are closed at the toe. Do not wear sandals.  If you use a stepladder:  Make sure that it is fully opened. Do not climb a closed stepladder.  Make sure that both sides of the stepladder are locked into place.  Ask someone to hold it for you, if possible.  Clearly mark and make sure that you can see:  Any grab bars or handrails.  First and last steps.  Where the edge of each step is.  Use tools that help you move around (mobility aids) if they are needed. These include:  Canes.  Walkers.  Scooters.  Crutches.  Turn on the lights when you go into a dark area. Replace any light bulbs as soon as they burn out.  Set up your furniture so you have a clear path. Avoid moving your furniture around.  If any of your floors are uneven, fix them.  If there are any pets around you, be aware of where they are.  Review your medicines with your doctor. Some medicines can make you feel dizzy. This can increase your chance of falling. Ask your doctor what other things that you can do to help prevent falls. This information is not intended to replace advice given to you by your health care provider. Make sure you discuss any questions you have with your health care provider. Document Released: 06/18/2009 Document Revised: 01/28/2016 Document Reviewed: 09/26/2014 Elsevier Interactive Patient Education  2017 [employer].

## 2019-08-19 MED FILL — MYCOPHENOLATE SODIUM 360 MG TABLET,DELAYED RELEASE: ORAL | 90 days supply | Qty: 180 | Fill #3

## 2019-08-19 MED FILL — MYCOPHENOLATE SODIUM 360 MG TABLET,DELAYED RELEASE: 90 days supply | Qty: 180 | Fill #3 | Status: AC

## 2019-08-20 ENCOUNTER — Ambulatory Visit: Admit: 2019-08-20 | Discharge: 2019-08-21 | Payer: MEDICARE

## 2019-08-20 ENCOUNTER — Encounter: Admit: 2019-08-20 | Discharge: 2019-08-21 | Payer: MEDICARE

## 2019-08-20 DIAGNOSIS — E1122 Type 2 diabetes mellitus with diabetic chronic kidney disease: Secondary | ICD-10-CM | POA: Diagnosis not present

## 2019-08-20 DIAGNOSIS — N181 Chronic kidney disease, stage 1: Secondary | ICD-10-CM | POA: Diagnosis not present

## 2019-08-20 DIAGNOSIS — Z9289 Personal history of other medical treatment: Secondary | ICD-10-CM | POA: Diagnosis not present

## 2019-08-20 DIAGNOSIS — E785 Hyperlipidemia, unspecified: Secondary | ICD-10-CM | POA: Diagnosis not present

## 2019-08-20 DIAGNOSIS — M1712 Unilateral primary osteoarthritis, left knee: Secondary | ICD-10-CM | POA: Diagnosis not present

## 2019-08-20 DIAGNOSIS — H905 Unspecified sensorineural hearing loss: Secondary | ICD-10-CM | POA: Diagnosis not present

## 2019-08-20 DIAGNOSIS — M329 Systemic lupus erythematosus, unspecified: Secondary | ICD-10-CM | POA: Diagnosis not present

## 2019-08-20 DIAGNOSIS — Z96653 Presence of artificial knee joint, bilateral: Secondary | ICD-10-CM | POA: Diagnosis not present

## 2019-08-20 DIAGNOSIS — I129 Hypertensive chronic kidney disease with stage 1 through stage 4 chronic kidney disease, or unspecified chronic kidney disease: Secondary | ICD-10-CM | POA: Diagnosis not present

## 2019-08-20 DIAGNOSIS — M25561 Pain in right knee: Secondary | ICD-10-CM | POA: Diagnosis not present

## 2019-08-20 DIAGNOSIS — G8929 Other chronic pain: Secondary | ICD-10-CM | POA: Diagnosis not present

## 2019-08-20 DIAGNOSIS — M25562 Pain in left knee: Secondary | ICD-10-CM

## 2019-08-20 MED ADMIN — triamcinolone acetonide (KENALOG-40) injection 40 mg: 40 mg | INTRA_ARTICULAR | @ 14:00:00 | Stop: 2019-08-20

## 2019-08-20 NOTE — Unmapped (Addendum)
Your diagnosis: Knee pain       Recommendations/Plan  ?? Medications: Take over-the-counter medications as needed and as tolerated  ?? If you don't have liver disease, the safest medication for pain is acetaminophen (Tylenol).  Take 1000mg  (2 extra strength tabs) at a time for pain relief.  Do not take more than 3000mg  per day.  ?? Activity: Activities as tolerated.  ?? Recommendations: We have recommended a brace  ?? We performed a knee injection.  ?? Recommend aggressive icing, elevation, compression stocking or ace wrap, and rest until symptoms improve  ?? Imaging studies: Bilateral Knees (prior to next visit): 4 view (AP/Lat/Tunnel/SunrisePat)  ?? Follow-up: To be scheduled in about 6 month(s).         ?? Contact our team electronically via MyChart message to [facility] Clinical Staff.    ?? Contact our team at [phone] or [phone] with any questions/concerns.  ?? Contact our appointment center at [phone] if you need to schedule an appointment.  ??
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
## 2019-08-22 ENCOUNTER — Encounter: Admit: 2019-08-22 | Discharge: 2019-08-23 | Payer: MEDICARE

## 2019-08-22 DIAGNOSIS — M797 Fibromyalgia: Secondary | ICD-10-CM | POA: Diagnosis not present

## 2019-08-22 DIAGNOSIS — M329 Systemic lupus erythematosus, unspecified: Secondary | ICD-10-CM | POA: Diagnosis not present

## 2019-08-22 DIAGNOSIS — L408 Other psoriasis: Secondary | ICD-10-CM | POA: Diagnosis not present

## 2019-08-22 DIAGNOSIS — M8949 Other hypertrophic osteoarthropathy, multiple sites: Secondary | ICD-10-CM | POA: Diagnosis not present

## 2019-08-22 DIAGNOSIS — J8410 Pulmonary fibrosis, unspecified: Secondary | ICD-10-CM | POA: Diagnosis not present

## 2019-08-22 DIAGNOSIS — M150 Primary generalized (osteo)arthritis: Principal | ICD-10-CM

## 2019-08-22 MED ORDER — HYDROXYCHLOROQUINE 200 MG TABLET
ORAL_TABLET | Freq: Two times a day (BID) | ORAL | 3 refills | 90.00000 days | Status: CP
Start: 2019-08-22 — End: ?

## 2019-08-22 NOTE — Unmapped (Signed)
I spent 15 minutes on the real-time audio and video visit with the patient. I spent an additional 10 minutes on pre- and post-visit activities.     The patient was not located and I was not located within 250 yards of a hospital based location during the real-time audio and video visit. The patient was physically located in [place] or a state in which I am permitted to provide care. The patient and/or parent/guardian understood that s/he may incur co-pays and cost sharing, and agreed to the telemedicine visit. The visit was reasonable and appropriate under the circumstances given the patient's presentation at the time.    The patient and/or parent/guardian has been advised of the potential risks and limitations of this mode of treatment (including, but not limited to, the absence of in-person examination) and has agreed to be treated using telemedicine. The patient's/patient's family's questions regarding telemedicine have been answered.    If the visit was completed in an ambulatory setting, the patient and/or parent/guardian has also been advised to contact their provider???s office for worsening conditions, and seek emergency medical treatment and/or call 911 if the patient deems either necessary.       Chief Compliant: SLE, OA, fibromyalgia follow-up    Identification: Pt self identified using name and date of birth  Patient location: [place]   The limitations of this telemedicine encounter were discussed with patient. Both the patient and myself agreed to this encounter despite these limitations. Benefits of this telemedicine encounter included allowing for continued care of patient and minimizing risk of exposure to COVID-19. HPI: Caitlin Smith is a 63 y.o. female who is here today for follow-up for SLE, OA, and fibromyalgia.  Initially diagnosed in 2006 with SLE presenting with episcleritis and arthralgia with serology showing +ANA, +dsDNA.  She also has stage I CKD felt secondary not due to SLE but due to HTN and obesity.   In 04/2016 she was dx with eosinophilic pneumonitis, for which she follows with pulmonology. She is currently being treated with prednisone taper under pulm direction. She had difficulty tapering off prednisone due to pulm symptoms, and cellcept was added. Cellcept changed to myfortic due to GI distress.      Interim history:  Presents today for f/u.     She states she has been doing about the same.  She has been trying to keep out of public places due to her health issue.  Covid pandemic has been stressful for her.  She has talked with her counselor about this and feels that her mood is better after close follow-up with her counselor.  She also has loud upstairs neighbors and feels this adds to her stress as well.  She plans to move to a new building in her complex after Christmas.  She has been packing up for the move, and this has been somewhat strenuous for her since she is doing this all by herself.    Her skin has been acting up bad.  Notes very itchy rashes along her bra line, in her groin, inner ears, on her back.  Has been using the cream from the dermatologist with minimal relief.  This is been very distressing to her.  The itching is disrupting her sleep, and she has to wear lots of close when she goes out due to feeling bad about the cosmetic appearance of her skin in public.    Breathing has been about the same.  Still has some dyspnea on exertion, but no worse in  the interim.    No chest pain, pleuritic pain.  No swelling of the legs.  She has been told by her PCP that she has neuropathy because she did not feel a pinprick test in her feet during recent routine follow-up. ROS??: Attests to the above, otherwise, review of ten system is negative.  ??  Past Medical and Surgical History:  ??  Patient Active Problem List    Diagnosis Date Noted   ??? Hyperglycemia 10/23/2017   ??? Bronchiolitis obliterans organizing pneumonia (CMS-HCC) 09/29/2016   ??? Vertigo 09/29/2016   ??? Neck pain 12/15/2015   ??? Neck pain, musculoskeletal 12/15/2015   ??? Fibromyalgia 10/12/2015   ??? Anxiety about health 09/29/2015   ??? Abnormal computed tomography scan 04/11/2015   ??? Abnormal presence of protein in urine 04/11/2015   ??? Urinary incontinence 04/11/2015   ??? Hearing difficulty 04/11/2015   ??? Chronic constipation 04/11/2015   ??? Chronic kidney disease (CKD), stage I 04/11/2015   ??? Chronic pain not due to malignancy 04/11/2015   ??? Chronic recurrent major depressive disorder (CMS-HCC) 04/11/2015   ??? Dyslipidemia 04/11/2015   ??? Obesity, diabetes, and hypertension syndrome (CMS-HCC) 04/11/2015   ??? Intertrigo 04/11/2015   ??? Fibrositis 04/11/2015   ??? Gastroesophageal reflux disease without esophagitis 04/11/2015   ??? Vitamin D deficiency 04/11/2015   ??? Seborrhea capitis 04/11/2015   ??? Perennial allergic rhinitis with seasonal variation 04/11/2015   ??? Moderate dysplasia of vulva 04/11/2015   ??? Spinal stenosis of lumbar region 04/11/2015   ??? Low back pain 04/11/2015   ??? Keratosis pilaris 04/11/2015   ??? Insomnia, persistent 04/11/2015   ??? Abnormal CAT scan 04/11/2015   ??? Absence of bladder continence 04/11/2015   ??? Auditory impairment 04/11/2015   ??? Chronic nonmalignant pain 04/11/2015   ??? Dysmetabolic syndrome 04/11/2015   ??? Eczema intertrigo 04/11/2015   ??? Gastro-esophageal reflux disease without esophagitis 04/11/2015   ??? Neurogenic claudication 04/11/2015   ??? Screening for cardiovascular condition 03/05/2015   ??? Encounter for screening for cardiovascular disorders 03/05/2015   ??? Treadmill stress test negative for angina pectoris 03/05/2015   ??? Nausea 10/30/2014   ??? Cough 05/20/2014   ??? Shortness of breath 05/20/2014 ??? Regurgitation 04/24/2014   ??? Epigastric burning sensation 04/24/2014   ??? Obesity (BMI 30-39.9) 02/03/2014   ??? Obesity with body mass index 30 or greater 02/03/2014   ??? Morbid obesity (CMS-HCC) 02/03/2014   ??? S/P total knee replacement 12/31/2013   ??? Asthma 12/31/2013   ??? History of total knee replacement 12/31/2013   ??? Asthma, chronic 12/31/2013   ??? H/O total knee replacement 12/31/2013   ??? SLE (systemic lupus erythematosus) (CMS-HCC) 11/28/2013   ??? Episcleritis 09/26/2013   ??? Constipation 07/19/2013   ??? Pre-op evaluation 07/10/2013   ??? Other diseases of vocal cords 05/28/2013   ??? Vocal cord dysfunction 05/28/2013   ??? Generalized anxiety disorder 03/19/2013   ??? Pain medication agreement signed 02/12/2013   ??? Family history of breast cancer    ??? 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 Backache 12/18/2012   ??? Back pain 12/18/2012   ??? Nonspecific abnormal finding of lung field 11/26/2012   ??? Multiple pulmonary nodules 11/26/2012   ??? Lung nodule, multiple 11/26/2012   ??? Pulmonary nodules 11/26/2012   ??? Osteoarthrosis 11/01/2012   ??? Osteoarthritis 11/01/2012   ??? Arthritis, degenerative 11/01/2012   ??? Postinflammatory pulmonary fibrosis (CMS-HCC) 10/22/2012   ??? Aspiration pneumonitis (CMS-HCC) 10/22/2012   ???  History of respiratory system disease 10/22/2012   ??? H/O aspiration pneumonitis 10/22/2012   ??? Mixed urge and stress incontinence 05/04/2012   ??? Mixed incontinence 05/04/2012   ??? Dysphonia 03/22/2012   ??? Sensorineural hearing loss 03/22/2012   ??? Chronic pain syndrome 05/19/2011   ??? Myalgia and myositis 02/25/2011   ??? Benign neoplasm of pituitary gland and craniopharyngeal duct (pouch) (CMS-HCC) 12/29/2010   ??? Benign neoplasm of pituitary gland and craniopharyngeal duct (CMS-HCC) 12/29/2010   ??? Chronic kidney disease, stage I 12/08/2010   ??? Proteinuria 12/08/2010   ??? Depressive disorder 11/05/2010   ??? Lupus erythematosus 11/05/2010   ??? Inflammatory autoimmune disorder (CMS-HCC) 11/05/2010 ??? Systemic lupus erythematosus (CMS-HCC) 11/05/2010   ??? Lung involvement in systemic lupus erythematosus (CMS-HCC) 11/05/2010   ??? Chronic nausea 07/01/2008   ??? Hypertension, benign 01/25/2005   ??? Benign hypertension 01/25/2005     Past Surgical History:   Procedure Laterality Date   ??? BRAIN SURGERY      for facial spasms   ??? BREAST BIOPSY Right 2012    Needle bx   ??? GALLBLADDER SURGERY     ??? HYSTERECTOMY N/A 07/09/2001    Vaginal Hysterectomy with ovaries in place   ??? JOINT REPLACEMENT Right     3 TO 4 YEARS AGO   ??? PR ANAL PRESSURE RECORD N/A 03/22/2016    Procedure: ANORECTAL MANOMETRY;  Surgeon: Nurse-Based Giproc;  [facility];  Service: Gastroenterology   ??? PR BRONCHOSCOPY,DIAGNOSTIC W LAVAGE Bilateral 04/25/2016    Procedure: BRONCHOSCOPY, RIGID OR FLEXIBLE, INCLUDE FLUOROSCOPIC GUIDANCE WHEN PERFORMED; W/BRONCHIAL ALVEOLAR LAVAGE WITH MODERATE SEDATION;  Surgeon: Mercy Moore, MD;  Location: BRONCH PROCEDURE LAB [facility];  Service: Pulmonary   ??? PR BRONCHOSCOPY,TRANSBRONCH BIOPSY N/A 04/25/2016    Procedure: BRONCHOSCOPY, RIGID/FLEXIBLE, INCLUDE FLUORO GUIDANCE WHEN PERFORMED; W/TRANSBRONCHIAL LUNG BX, SINGLE LOBE WITH MODERATE SEDATION;  Surgeon: Mercy Moore, MD;  Location: BRONCH PROCEDURE LAB [facility];  Service: Pulmonary   ??? PR COLON CA SCRN NOT HI RSK IND  08/22/2013    Procedure: COLOREC CNCR SCR;COLNSCPY NO;  Surgeon: Brown Human, MD;  Location: [facility];  Service: Gastroenterology   ??? PR GERD TST W/ MUCOS IMPEDE ELECTROD,>1HR N/A 05/26/2014    Procedure: ESOPHAGEAL FUNCTION TEST, GASTROESOPHAGEAL REFLUX TEST W/ NASAL CATHETER INTRALUMINAL IMPEDANCE ELECTRODE(S) PLACEMENT, RECORDING, ANALYSIS AND INTERPRETATION; PROLONGED;  Surgeon: Nurse-Based Giproc;  [facility];  Service: Gastroenterology   ??? PR TOTAL KNEE ARTHROPLASTY Right 12/31/2013 Procedure: ARTHROPLASTY, KNEE, CONDYLE & PLATEAU; MEDIAL & LAT COMPARTMENT W/WO PATELLA RESURFACE (TOTAL KNEE ARTHROP);  Surgeon: Aram Beecham, MD;  Location: MAIN OR [facility];  Service: Orthopedics   ??? PR UPPER GI ENDOSCOPY,BIOPSY N/A 11/07/2014    Procedure: UGI ENDOSCOPY; WITH BIOPSY, SINGLE OR MULTIPLE;  Surgeon: Trula Slade, MD;  Location: [facility];  Service: Gastroenterology   ??? SKIN BIOPSY     ??? wide local excision of vulva Right      Allergies:   ??  Allergies   Allergen Reactions   ??? Vilazodone Swelling   ??? Ace Inhibitors Other (See Comments)     Pt states she can not take ace inhibitors. Pt states she can not remember her reaction.    ??? Bee Pollen Itching     COUGH, WATERY EYES   ??? Penicillin G Rash   ??? Penicillins Rash   ??? Pollen Extracts Itching     COUGHING, WATERY EYES  Current Outpatient Medications:  ??  Current Outpatient Medications on File Prior to Visit   Medication Sig Dispense Refill   ??? amLODIPine (NORVASC) 5 MG tablet Take 1 tablet (5 mg total) by mouth daily. 90 tablet 3   ??? aspirin (ECOTRIN) 81 MG tablet Take 81 mg by mouth daily.     ??? atorvastatin (LIPITOR) 40 MG tablet Take 40 mg by mouth daily.     ??? biotin 1 mg cap Take by mouth.     ??? buPROPion (WELLBUTRIN XL) 150 MG 24 hr tablet Take 1 tablet (150 mg total) by mouth every morning. 90 tablet 2   ??? calcipotriene (DOVONOX) 0.005 % ointment Apply every other day to areas of rash in folds as maintenance 60 g 2   ??? clobetasoL (TEMOVATE) 0.05 % ointment Apply BID to all affected area 60 g 5   ??? clonazePAM (KLONOPIN) 0.5 MG tablet Take 0.5 tablets (0.25 mg total) by mouth nightly. 15 tablet 2   ??? cyclobenzaprine (FLEXERIL) 5 MG tablet Take 1 tablet (5 mg total) by mouth two (2) times a day as needed. 60 tablet 2   ??? diclofenac sodium (VOLTAREN) 1 % gel Apply 4 g topically Four (4) times a day. 300 g 5   ??? dicyclomine (BENTYL) 20 mg tablet Take 20 mg by mouth every six (6) hours. ??? DULoxetine (CYMBALTA) 60 MG capsule Take 2 capsules (120 mg total) by mouth daily. 180 capsule 2   ??? FLOVENT HFA 110 mcg/actuation inhaler USE 2 PUFFS BY MOUTH TWO  TIMES DAILY 36 g 3   ??? fluticasone propionate (FLONASE) 50 mcg/actuation nasal spray      ??? gabapentin (NEURONTIN) 300 MG capsule Take 1 capsule (300 mg total) by mouth Three (3) times a day. 90 capsule 2   ??? halobetasol (ULTRAVATE) 0.05 % ointment Apply topically Two (2) times a day. 50 g 1   ??? hydroxychloroquine (PLAQUENIL) 200 mg tablet Take 1 tablet (200 mg total) by mouth Two (2) times a day. 180 tablet 3   ??? hydrOXYzine (ATARAX) 10 MG tablet      ??? inhalational spacing device (AEROCHAMBER MV) Spcr 1 each by Miscellaneous route two (2) times a day. With Fovent 1 each 0   ??? ketoconazole (NIZORAL) 2 % cream APP EXT AA D  0   ??? lamoTRIgine (LAMICTAL) 200 MG tablet Take 1 tablet (200 mg total) by mouth Two (2) times a day. 180 tablet 2   ??? meclizine (ANTIVERT) 25 mg tablet Take 25 mg by mouth Three (3) times a day as needed.      ??? mupirocin (BACTROBAN) 2 % ointment APP EXT IEN BID  0   ??? mycophenolate (MYFORTIC) 360 MG TbEC TAKE 1 TABLET BY MOUTH TWICE DAILY 180 each 3   ??? olopatadine (PATANOL) 0.1 % ophthalmic solution Apply to eye.     ??? omeprazole (PRILOSEC) 40 MG capsule Take 40 mg by mouth daily.      ??? polyethylene glycol (GLYCOLAX) 17 gram/dose powder 2 cap fulls in a full glass of water, three times a day, for 5 days.     ??? telmisartan (MICARDIS) 40 MG tablet Take by mouth two (2) times a day.      ??? traZODone (DESYREL) 100 MG tablet Take 2 tablets (200 mg total) by mouth nightly. 180 tablet 2   ??? triamcinolone (KENALOG) 0.1 % ointment Apply to rash when itchy as needed, few times per week 454 g 0   ??? benzonatate (  TESSALON) 200 MG capsule Take 200 mg by mouth Three (3) times a day as needed for cough.       No current facility-administered medications on file prior to visit.      ?  ????  PHYSICAL EXAM?? There were no vitals filed for this visit.  General:   Pleasant 63 y.o.female in no acute distress, WDWN   Lungs:  Normal respiratory effort, no coughing or wheezing    Musculoskeletal:   General: Ambulates w/o assistance   Hands: No swelling or tenderness. Able to make a tight fist b/l   Wrists:FROM w/o swelling or tenderness    Psych:  Appropriate affect and mood   Skin:   Unable to assess rashes given poor video quality.           ?GENERAL SUMMARY/IMPRESSION AND RECOMMENDATIONS: ??  1. Systemic lupus erythematosus, unspecified SLE type, unspecified organ involvement status (CMS-HCC)  Stable.  Continue Plaquenil 200 mg twice daily.  Check labs below to ensure serologic stability.  - AST; Future  - ALT; Future  - C3 complement; Future  - Anti-DNA antibody, double-stranded; Future  - BUN; Future  - C4 complement; Future  - Creatinine; Future  - CBC w/ Differential; Future  - Urinalysis with Culture Reflex; Future  - Protein/Creatinine Ratio, Urine; Future  - hydrOXYchloroQUINE (PLAQUENIL) 200 mg tablet; Take 1 tablet (200 mg total) by mouth Two (2) times a day.  Dispense: 180 tablet; Refill: 3    2. Primary osteoarthritis involving multiple joints  Stable    3. Inverse psoriasis  This is her main complaint today.  I recommend follow-up with dermatology for worsening itching and rash which seems to be related to her inverse psoriasis.  She is not currently on any systemic steroids.  May try over-the-counter Zyrtec for itching.    4. Fibromyalgia  Stable    5. Pulmonary fibrosis (CMS-HCC)  Continue follow-up with pulmonology.  Continue Myfortic 360 mg twice daily.        HCM:   - [id number] Status: 05/26/2016  - PPSV 23 Status: 08/04/2016  - Annual Influenza vaccine. Status: 05/2019.   - Bone health: Dexa 11/2017 with normal BMD.  Off steroids.  - Plaquenil eye exam: 06/18/19  - Contraception: postmenopausal      RTC 4 mo as scheduled with Dr Scarlette Calico

## 2019-08-22 NOTE — Unmapped (Signed)
Try zyrtec (cetirizine) for itching. Can be purchased over the counter.   Call dermatology to discuss your worsening skin rash.

## 2019-09-10 ENCOUNTER — Encounter
Admit: 2019-09-10 | Discharge: 2019-09-11 | Payer: MEDICARE | Attending: Student in an Organized Health Care Education/Training Program | Primary: Student in an Organized Health Care Education/Training Program

## 2019-09-10 MED ORDER — CLONAZEPAM 0.5 MG TABLET
ORAL_TABLET | Freq: Every evening | ORAL | 2 refills | 30 days | Status: CP
Start: 2019-09-10 — End: ?

## 2019-09-10 NOTE — Unmapped (Signed)
[facility]  Psychiatry Telehealth Encounter  Established Patient     I spent 31 minutes on the real-time audio and video with the patient. I spent an additional 10 minutes on pre- and post-visit activities.     The patient was physically located in [place] or a state in which I am permitted to provide care. The patient and/or parent/guardian understood that s/he may incur co-pays and cost sharing, and agreed to the telemedicine visit. The visit was reasonable and appropriate under the circumstances given the patient's presentation at the time.    The patient and/or parent/guardian has been advised of the potential risks and limitations of this mode of treatment (including, but not limited to, the absence of in-person examination) and has agreed to be treated using telemedicine. The patient's/patient's family's questions regarding telemedicine have been answered.     If the visit was completed in an ambulatory setting, the patient and/or parent/guardian has also been advised to contact their provider???s office for worsening conditions, and seek emergency medical treatment and/or call 911 if the patient deems either necessary.    Assessment:  Caitlin Smith is a 64 y.o. female with a history of MDD, GAD, and Panic Disorder in the context of many chronic medical conditions including chronic pain/fibromyalgia, osteoarthritis with degenerative disc disease, Lupus, pituitary tumor (benign), s/p right total knee arthroplasty, and VIN II (s/p resection), who presents for follow-up evaluation. She has been maintained on Lamictal for many years as well as cymbalta, trazodone, klonopin, and wellbutrin. Patient has previously tolerated a slight taper in her klonopin from 0.75 mg to 0.25 mg qHS overtime. Her mood is often exacerbated by steroid use for current treatment of pulmonary disease. Patient is engaged with CBT therapy at the pain clinic.    Met Caitlin Smith today 1/5 for epic video-visit.  She reports betterment of her anxiety and depression until she needed to move; her landlord has been delaying it per her report which has been really stressful.  Otherwise she feels like her medicines are treating her well.  We discuss looking into tenant rights and if there are any resources for seniors in the community. She reports improved sleep using melatonin. Caitlin Smith reports that she will call clinicians and her children should she feel overwhelmed by her stress and anxiety or have any thoughts of SI, which she denies at today's visit.  Will follow-up in 2-3 months.     Risk Assessment:  A full suicide and violence risk assessment was performed as part of this patient's initial evaluation with [facility] outpatient psychiatry.  There is no new acute risk for suicide or violence at this time. The patient was educated about relevant modifiable risk factors including following recommendations for treatment of psychiatric illness and abstaining from substance abuse.         While future psychiatric events cannot be accurately predicted, the patient does not currently require acute inpatient psychiatric care and does not currently meet [facility] involuntary commitment criteria.     Plan:  # Depression - Anxiety - Panic Disorder  - Continue Lamictal 400mg  qhs for mood  - Continue Cymbalta 120mg  qAM to target depressive sx and chronic pain  - Continue Wellbutrin XL 150mg  qAM   - Continue Clonazepam 0.25mg  qHS, appropriate filling per [facility]  - Continue CBT at pain clinic    # Insomnia: Last sleep study performed in 2017. Per chart review, showed mild OSA likely related to medication use. Neurology recommends patient avoid sleeping  on back as she likely has OSA when sleeping in this position  - Continue Trazodone 200mg  qHS for insomnia. May plan to discontinue in future if insomnia improves after discontinuation of Wellbutrin and initiation of melatonin.   - Clonazepam per above    # Mild Neurocognitive Impairment: Patient seen by Neurology ([facility]) 05/22/18 who dx pt with mild neurocognitive impairment w/ deficits in inattention and visual-spatial function with suspicion that sleep apnea and polypharmacy may be contributing to her difficulties. Recommended that psychopharm regimen be simplified, including eliminating trazodone and considering changing Wellbutrin to Zoloft or Lexapro for anxiety and eliminating Klonopin when stable. As of 10/20, we have not been able to decrease clonazepam or discontinue wellbutrin. Pandemic and social atmosphere make changes in pharm more challenging at this time  - Plan for discontinuation of Wellbutrin per above; this is a continuing discussion, Caitlin Smith feels like  - Will consider taper/discontinuation of Trazodone vs Klonopin next if sleep improves after above changes  - However, it is very likely that patient's lupus is significantly contributing to cognitive decline, which will limit improvement she will see with reduction in polypharmacy  --Last MOCA 22 on 12/12/2017    # Medical Monitoring - High Risk Medication Use  - The complexity of this patient's care involves drug therapy which requires intensive monitoring for toxicity. This is in part due to a narrow therapeutic window, as well as potential for toxicity. This patient is being treated with Lamotrigine (Lamictal) to target their psychiatric disorder, Major depressive disorder, refractory. In regards to this medication being considered high risk, Lamotrigine (lamictal) has black box warning for serious rash including fatal reaction of Stevens-Johnson syndrome and rare cases of toxic epidermal necrolysis. In addition to prolonged titration to mitigate risk of serious rash, lamotrigine requires monitoring of hepatic and renal function at baseline, and at times will require monitoring of lamotrigine blood levels.    Lamotrigine Monitoring  - Hepatic function testing, platelets (via CBC) q6 months  -AST/ALT WNL     Lab Results   Component Value Date    WBC 4.6 07/01/2019    HGB 14.7 07/01/2019    HCT 48.2 (H) 07/01/2019    PLT 333 07/01/2019       Lab Results   Component Value Date    NA 138 05/22/2018    K 5.1 (H) 05/22/2018    CL 100 05/22/2018    CO2 33.0 (H) 05/22/2018    BUN 12 04/08/2019    CREATININE 0.90 07/01/2019    GLU 99 05/22/2018    CALCIUM 9.3 05/22/2018    MG 2.1 12/31/2013    PHOS 4.9 (H) 12/31/2013       Lab Results   Component Value Date    BILITOT 0.4 05/22/2018    BILIDIR <0.10 05/01/2018    PROT 7.7 09/20/2018    ALBUMIN 4.0 09/20/2018    ALT 15 07/01/2019    AST 30 07/01/2019    ALKPHOS 74 05/22/2018    GGT 19 08/26/2012       Lab Results   Component Value Date    PT 12.0 09/17/2012    INR 1.11 01/03/2014    APTT 30.8 01/03/2014     # Return to Clinic: 2-3 months     Psychotherapy:  No billable psychotherapy service provided but brief supportive therapy was utilized.    Patient has been given this writer's contact information as well as the [facility] urgent line number. The patient has been instructed  to call 911 for emergencies.    Patient and plan of care were discussed with the Attending MD,Dr. Cecilio Asper, who agrees with the above statement and plan.     Connye Burkitt MD    Subjective:     Psychiatric Chief Concern:  Follow-up psychiatric evaluation for depression, anxiety and insomnia.     Interval History: Ms. Forstner says that she is ok today but is struggling with her apartment. She was supposed to move out over christmas time with the help of her children. The apartment, however, changed the move in time. She says she is all packed up and it is stressful to live out of boxes. She reports that she plans to continue dialoguing with her apartment company. She says her medications are treating her well, and besides the struggle with her apartment she has little stress.  The stress from moving, however, is certainly a big one. She feels like melatonin was helping her sleep a lot before now with this stress it's not working as well.      Social History: reviewed; pertinents have been documented in the interval history section.    ROS:  As per Interval History and:  Constitutional:  no significant appetite change, endorses lessened anxiety, improved sleep  Neuro:  none / negative    Objective:    Mental Status Exam:   Appearance  Patient in view of camera, appears stated age, well-nourished , well-groomed       Speech/Language:    Normal rate, volume, tone, fluency and Language intact, well formed   Affect  Calm, mood congruent   Mood:   stressed over needing to move   Thought process and Associations:   Logical, linear, clear, coherent, goal directed   Abnormal/psychotic thought content:     Denies SI, HI, self harm, delusions, obsessions, paranoid ideation, or ideas of reference   Perceptual disturbances:     Does not endorse auditory or visual hallucinations     Orientation:   Oriented to person, place, time, and general circumstances   Insight:     Intact   Judgment:    Intact   Impulse Control:   Intact     Physical Exam:   Gen: Patient in view of camera, sitting on cough INAD, wearing glasses  HEENT: Normocephalic, atraumatic  Resp: Does not appear to have increased WOB    Medications: reviewed at today's visit    Psychometrics:  Psych Scale Scores - Adult      Office Visit from 09/04/2018 in [facility] AT [facility]   **PHQ-9: Severity Measure for DEPRESSION Total Score**  20 Collected on 09/04/2018 0000   WHODAS 2.0 (Self-administered) - Total Score  42 Collected on 09/04/2018 0000          Posey Rea, MD

## 2019-09-30 ENCOUNTER — Encounter: Admit: 2019-09-30 | Discharge: 2019-10-01 | Payer: MEDICARE | Attending: Psychologist | Primary: Psychologist

## 2019-09-30 DIAGNOSIS — F331 Major depressive disorder, recurrent, moderate: Principal | ICD-10-CM

## 2019-09-30 DIAGNOSIS — F410 Panic disorder [episodic paroxysmal anxiety] without agoraphobia: Principal | ICD-10-CM

## 2019-09-30 DIAGNOSIS — F411 Generalized anxiety disorder: Principal | ICD-10-CM

## 2019-09-30 DIAGNOSIS — F4310 Post-traumatic stress disorder, unspecified: Principal | ICD-10-CM

## 2019-09-30 NOTE — Unmapped (Signed)
[facility]   Confidential Psychological Therapy Session      Patient Name: Caitlin Smith  Medical Record Number: [id number]  Date of Service: September 30, 2019  Attending Psychologist: Caroline More, PhD  CPT Procedure Codes: 81191 for 60 mins of face to face counseling    Due to the current declared state emergency during the coronavirus pandemic, all non-urgent medical and mental health visits have been triaged to either be delayed or take place virtually via phone or videoconferencing.   Due to the need for continued patient interaction for mental health care, pain management, and care coordination that necessitates the involvement of this provider, this visit was performed face to face using interactive technology using a HIPPA compliant audio/visual platform. This patient will be scheduled for face to face visits in the future once it has been deemed safe.      We reviewed confidentiality today. The patient was present at home (location and contact information confirmed), attended this visit alone, and consented to this virtual pain psychology visit.     Visit modifiers:   GT for Interactive Technology and CR for catastrophe/disaster related due to coronavirus pandemic    REFERRING PHYSICIAN: Clarene Essex, MD    CHIEF COMPLAINT AND REASON FOR VISIT: pain coping skills, CBT to address depression and anxiety in the setting of chronic pain    SUBJECTIVE / HISTORY OF PRESENT ILLNESS: Ms.  Caitlin Smith is a very pleasant 64 y.o.  female from [place], [place] with multiple chronic pain complaints related to fibromyalgia and lupus who initially met with me in October 2016, and which time she was diagnosed with severe depression, PTSD, panic disorder, and generalized anxiety.The patient returns for a therapy session today. Last follow up with me was 07/16/2019.     Patient participated actively throughout her therapy session today.  Noted mood has been more down.  She has been keeping her granddaughter 5 days a week, helping her with virtual school.  Her granddaughter spends the night.  As a result she has had less time for herself.  She cannot leave her granddaughter at home and go out and walk.  She does not feel comfortable having her granddaughter walk with her because there is not a sidewalk and she worries her young granddaughter would not be safe.  Patient expresses frustration after having gained an additional 20 pounds.  Believes the weight gain is mostly due to change in activity level but acknowledges she could do better in terms of eating habits.  Recall that after she started walking and losing weight last time, she later began improving her diet, focusing on drinking water and eating fruits and vegetables.  Reviewed problem solving today and she decides to keep a food log and stop buying snack foods/prepackaged snacks.  Plans to instead substitute fruit and increase vegetable intake during meals.  We will work towards initially losing weight with dietary management, and then brainstormed ways she can increase activity in the event that walking outside (her preferred activity) remains an ongoing barrier.  Spent extra time today processing thoughts and feelings related to coronavirus restrictions and social isolation.  Patient misses getting out.  Although she enjoys spending time with her granddaughter, she acknowledges she misses spending time with friends and just being out in public.  Utilized mindfulness approach to thoughts and feelings.    OBJECTIVE / MENTAL STATUS:    Appearance:   Appears stated age and Clean/Neat   Motor:  No abnormal movements  Speech/Language:   Normal rate, volume, tone, fluency   Mood:  Depressed and Anxious   Affect:  Blunted   Thought process:  Logical, linear, clear, coherent, goal directed   Thought content:    Denies SI, HI, self harm, delusions, obsessions, paranoid ideation, or ideas of reference   Perceptual disturbances:    Denies auditory and visual hallucinations, behavior not concerning for response to internal stimuli   Orientation:  Oriented to person, place, time, and general circumstances   Attention:  Able to fully attend without fluctuations in consciousness   Concentration:  Able to fully concentrate and attend   Memory:  Immediate, short-term, long-term, and recall grossly intact    Fund of knowledge:   Consistent with level of education and development   Insight:    Fair   Judgment:   Intact   Impulse Control:  Intact     DIAGNOSTIC IMPRESSION:   Post Traumatic Stress Disorder (PTSD)  Generalized Anxiety Disorder (GAD)  Panic Disorder  Major Depressive Disorder, moderate, recurrent  Chronic pain syndrome  Fibromyalgia  Lupus    ASSESSMENT:   [name] is a very pleasant 64 y.o.  female from [place], [place] with multiple chronic pain complaints related to lupus, arthritis, and fibromyalgia. She was previously seen in our clinic from 2012-2014, and reestablished care with Dr. Fayrene Fearing in August 2016. The patient is struggling with depression and anxiety, but is very motivated to participate in intensive, multidisciplinary treatment to address the connection between depression, anxiety and pain. She has a long-standing history of depression and anxiety, and has worked with outpatient psychiatrist and therapist over the years. She is currently established with [facility] psychiatry, and is tapering off of Klonopin.  Depression, anxiety, and panic have all improved.  Has been diagnosed with diabetes, and is managing it well with p.o. medication and dietary changes.  Had lost 50+ pounds but regained 20 lbs recently.  Patient presents with low-grade depression related to COVID and social isolation.       PLAN:   (1) Psychotherapy - Continue CBT. CBT will be used to address PTSD, MDD, Panic D/o, GAD, and chronic pain.     --Behavioral Activation - Encouraged behavioral activation.  Is doing great, walking 5 days/week  --Downloaded and uses Insight Timer, guided relaxation app, for nightly practice.  --Continues to utilize diaphragmatic breathing daily    (2) Psychiatry - Pt currently established with [facility].      (3) Safety - Pt denies any current or recent SI or safety concerns. Knows to call 911 or go to her local ED. Also previously given [facility].      (4) follow-up - with me in 2 weeks

## 2019-10-09 ENCOUNTER — Encounter: Payer: Self-pay | Admitting: Family Medicine

## 2019-10-09 ENCOUNTER — Ambulatory Visit (INDEPENDENT_AMBULATORY_CARE_PROVIDER_SITE_OTHER): Payer: Medicare Other | Admitting: Family Medicine

## 2019-10-09 ENCOUNTER — Other Ambulatory Visit: Payer: Self-pay

## 2019-10-09 VITALS — BP 140/90 | HR 72 | Temp 98.0°F | Resp 16 | Ht 68.0 in | Wt 222.5 lb

## 2019-10-09 DIAGNOSIS — H905 Unspecified sensorineural hearing loss: Secondary | ICD-10-CM

## 2019-10-09 DIAGNOSIS — I10 Essential (primary) hypertension: Secondary | ICD-10-CM

## 2019-10-09 DIAGNOSIS — E669 Obesity, unspecified: Secondary | ICD-10-CM

## 2019-10-09 DIAGNOSIS — E785 Hyperlipidemia, unspecified: Secondary | ICD-10-CM

## 2019-10-09 DIAGNOSIS — J302 Other seasonal allergic rhinitis: Secondary | ICD-10-CM

## 2019-10-09 DIAGNOSIS — E1169 Type 2 diabetes mellitus with other specified complication: Secondary | ICD-10-CM

## 2019-10-09 DIAGNOSIS — G43909 Migraine, unspecified, not intractable, without status migrainosus: Secondary | ICD-10-CM

## 2019-10-09 DIAGNOSIS — J3089 Other allergic rhinitis: Secondary | ICD-10-CM

## 2019-10-09 DIAGNOSIS — F339 Major depressive disorder, recurrent, unspecified: Secondary | ICD-10-CM | POA: Diagnosis not present

## 2019-10-09 DIAGNOSIS — M797 Fibromyalgia: Secondary | ICD-10-CM

## 2019-10-09 DIAGNOSIS — R42 Dizziness and giddiness: Secondary | ICD-10-CM

## 2019-10-09 DIAGNOSIS — R110 Nausea: Secondary | ICD-10-CM

## 2019-10-09 LAB — POCT GLYCOSYLATED HEMOGLOBIN (HGB A1C): Hemoglobin A1C: 5.5 % (ref 4.0–5.6)

## 2019-10-09 MED ORDER — OLOPATADINE HCL 0.1 % OP SOLN: 1.0000 [drp] | Freq: Two times a day (BID) | OPHTHALMIC | 3 refills | Status: DC

## 2019-10-09 MED ORDER — MECLIZINE HCL 25 MG PO TABS: 25.0000 mg | ORAL_TABLET | Freq: Three times a day (TID) | ORAL | 0 refills | Status: DC | PRN

## 2019-10-09 MED ORDER — ONDANSETRON HCL 4 MG PO TABS: 4.0000 mg | ORAL_TABLET | Freq: Three times a day (TID) | ORAL | 0 refills | Status: DC | PRN
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
## 2019-10-14 NOTE — Unmapped (Deleted)
Otolaryngology Return Voice Visit    History of Present Illness:  [name] is a 64 y.o.  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   ??  At the end of our previous clinic visit we discussed and recommended clinical course of Respiratory Retraining, Biotene mouthwash, D/C Listerine- switch to salt water gargles   ??  July 2017: I recommended NeilMed rinses and a workup of glossitis (electrolyte and vitamin levels per primary care physician: B12, zinc, etc.,) Her diagnoses included right vocal fold hypomobility/depression anxiety. 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  ??  December 2018: The patient notes that her symptoms have been relatively stable.  She continues to complain of burning tongue.  Following this visit I recommended a thyroid hormone panel and thyroid ultrasound.    August 2019: The patient reports that she continues to clear there throat and cough in a consistent manner.  Thyroid ultrasound was unremarkable w/o nodules. She occasionally (rarely ) awakens from sleep with cough. She has done a few sessions with SLP, but claims to have forgotten the techniques. Following the visit, I recommended she consult her dentist regarding specialty referrals for burning mouth syndrome and we discussed additional behavioral intervention trials for cough.       February 2021:   ??    Interval Surgery/Medical History:    Interval  Past Medical History and ROS is otherwise unchanged except as per HPI      Medications:    Current Outpatient Medications:   ???  amLODIPine (NORVASC) 5 MG tablet, Take 1 tablet (5 mg total) by mouth daily., Disp: 90 tablet, Rfl: 3  ???  aspirin (ECOTRIN) 81 MG tablet, Take 81 mg by mouth daily., Disp: , Rfl:   ???  atorvastatin (LIPITOR) 40 MG tablet, Take 40 mg by mouth daily., Disp: , Rfl:   ???  benzonatate (TESSALON) 200 MG capsule, Take 200 mg by mouth Three (3) times a day as needed for cough., Disp: , Rfl:   ???  biotin 1 mg cap, Take by mouth., Disp: , Rfl:   ???  buPROPion (WELLBUTRIN XL) 150 MG 24 hr tablet, Take 1 tablet (150 mg total) by mouth every morning., Disp: 90 tablet, Rfl: 2  ???  calcipotriene (DOVONOX) 0.005 % ointment, Apply every other day to areas of rash in folds as maintenance, Disp: 60 g, Rfl: 2  ???  clobetasoL (TEMOVATE) 0.05 % ointment, Apply BID to all affected area, Disp: 60 g, Rfl: 5  ???  clonazePAM (KLONOPIN) 0.5 MG tablet, Take 0.5 tablets (0.25 mg total) by mouth nightly., Disp: 15 tablet, Rfl: 2  ???  cyclobenzaprine (FLEXERIL) 5 MG tablet, Take 1 tablet (5 mg total) by mouth two (2) times a day as needed., Disp: 60 tablet, Rfl: 2  ???  diclofenac sodium (VOLTAREN) 1 % gel, Apply 4 g topically Four (4) times a day., Disp: 300 g, Rfl: 5  ???  dicyclomine (BENTYL) 20 mg tablet, Take 20 mg by mouth every six (6) hours., Disp: , Rfl:   ???  DULoxetine (CYMBALTA) 60 MG capsule, Take 2 capsules (120 mg total) by mouth daily., Disp: 180 capsule, Rfl: 2  ???  FLOVENT HFA 110 mcg/actuation inhaler, USE 2 PUFFS BY MOUTH TWO  TIMES DAILY, Disp: 36 g, Rfl: 3  ???  fluticasone propionate (FLONASE) 50 mcg/actuation nasal spray, , Disp: , Rfl:   ???  gabapentin (NEURONTIN) 300 MG capsule, Take 1 capsule (300 mg total) by mouth Three (3) times a day., Disp: 90 capsule, Rfl: 2  ???  halobetasol (ULTRAVATE) 0.05 % ointment, Apply topically Two (2) times a day., Disp: 50 g, Rfl: 1  ???  hydrOXYchloroQUINE (PLAQUENIL) 200 mg tablet, Take 1 tablet (200 mg total) by mouth Two (2) times a day., Disp: 180 tablet, Rfl: 3  ???  hydrOXYzine (ATARAX) 10 MG tablet, , Disp: , Rfl:   ???  inhalational spacing device (AEROCHAMBER MV) Spcr, 1 each by Miscellaneous route two (2) times a day. With [employer], Disp: 1 each, Rfl: 0  ???  ketoconazole (NIZORAL) 2 % cream, APP EXT AA D, Disp: , Rfl: 0  ???  lamoTRIgine (LAMICTAL) 200 MG tablet, Take 1 tablet (200 mg total) by mouth Two (2) times a day., Disp: 180 tablet, Rfl: 2  ???  meclizine (ANTIVERT) 25 mg tablet, Take 25 mg by mouth Three (3) times a day as needed. , Disp: , Rfl:   ???  mupirocin (BACTROBAN) 2 % ointment, APP EXT IEN BID, Disp: , Rfl: 0  ???  mycophenolate (MYFORTIC) 360 MG TbEC, TAKE 1 TABLET BY MOUTH TWICE DAILY, Disp: 180 each, Rfl: 3  ???  olopatadine (PATANOL) 0.1 % ophthalmic solution, Apply to eye., Disp: , Rfl:   ???  omeprazole (PRILOSEC) 40 MG capsule, Take 40 mg by mouth daily. , Disp: , Rfl:   ???  polyethylene glycol (GLYCOLAX) 17 gram/dose powder, 2 cap fulls in a full glass of water, three times a day, for 5 days., Disp: , Rfl:   ???  telmisartan (MICARDIS) 40 MG tablet, Take by mouth two (2) times a day. , Disp: , Rfl:   ???  traZODone (DESYREL) 100 MG tablet, Take 2 tablets (200 mg total) by mouth nightly., Disp: 180 tablet, Rfl: 2  ???  triamcinolone (KENALOG) 0.1 % ointment, Apply to rash when itchy as needed, few times per week, Disp: 454 g, Rfl: 0         Pertinent ROS as above and per HPI.     Physical Exam:   Constitutional:  Vitals reviewed on nursing chart.  Voice: Mild reathiness  Respiration:  Breathing comfortably, no stridor.   Ears:  Normal tympanic membranes to otoscopy,   Nose:  External nose midline, anterior rhinoscopy is normal with limited visualization just to the anterior interior turbinate.   Oral Cavity/Oropharynx/Lips:  Normal mucous membranes, normal floor of mouth/tongue/oropharynx, no masses or lesions are noted.    Pharyngeal Walls:  No masses noted.  Neck/Lymph:  No lymphadenopathy, no thyroid masses.      VRQOL: 75  GFI: 8      Assessment:   64 y.o.  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   ??  At the end of our previous clinic visit we discussed and recommended clinical course of Respiratory Retraining, Biotene mouthwash, D/C Listerine- switch to salt water gargles   ??  July 2017: I recommended NeilMed rinses and a workup of glossitis (electrolyte and vitamin levels per primary care physician: B12, zinc, etc.,) Her diagnoses included right vocal fold hypomobility/depression anxiety. 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  ??  December 2018: The patient notes that her symptoms have been relatively stable.  She continues to complain of burning tongue.  Following this visit I recommended a thyroid hormone panel and thyroid  ultrasound.    August 2019: The patient reports that she continues to clear there throat and cough in a consistent manner.  Thyroid ultrasound was unremarkable w/o nodules. She occasionally (rarely ) awakens from sleep with cough. She has done a few sessions with SLP, but claims to have forgotten the techniques.  The patient reports she was unable to see the specialist to whom I referred her for burning mouth secondary to insurance coverage issues.  ??  1. Slightly enlarged right Thyroid gland/ no dominant nodule  2.  Burning mouth syndrome     I recommended that she consult her dentist regarding specialty referrals for burning mouth syndrome.    February 2021:     Plan:      The patient in agreement the plan as articulated above.      Scribe's Attestation: Rosalita Chessman, MD obtained and performed the history, physical exam and medical decision making elements that were entered into the chart.  Signed by Jerl Santos, Scribe, on *** at ***.    {*** NOTE TO PROVIDER: PLEASE ADD ATTESTATION NOTING YOU AGREE WITH SCRIBE DOCUMENTATION}

## 2019-10-16 ENCOUNTER — Encounter: Admit: 2019-10-16 | Discharge: 2019-10-17 | Payer: MEDICARE | Attending: Psychologist | Primary: Psychologist

## 2019-10-16 DIAGNOSIS — F331 Major depressive disorder, recurrent, moderate: Principal | ICD-10-CM

## 2019-10-16 DIAGNOSIS — F411 Generalized anxiety disorder: Principal | ICD-10-CM

## 2019-10-16 DIAGNOSIS — F410 Panic disorder [episodic paroxysmal anxiety] without agoraphobia: Principal | ICD-10-CM

## 2019-10-16 DIAGNOSIS — F4310 Post-traumatic stress disorder, unspecified: Principal | ICD-10-CM

## 2019-10-16 NOTE — Unmapped (Signed)
[facility]   Confidential Psychological Therapy Session      Patient Name: Caitlin Smith  Medical Record Number: [id number]  Date of Service: October 16, 2019  Attending Psychologist: Caroline More, PhD  CPT Procedure Codes: 81191 for 60 mins of face to face counseling    Due to the current declared state emergency during the coronavirus pandemic, all non-urgent medical and mental health visits have been triaged to either be delayed or take place virtually via phone or videoconferencing.   Due to the need for continued patient interaction for mental health care, pain management, and care coordination that necessitates the involvement of this provider, this visit was performed face to face using interactive technology using a HIPPA compliant audio/visual platform. This patient will be scheduled for face to face visits in the future once it has been deemed safe.      We reviewed confidentiality today. The patient was present at home (location and contact information confirmed), attended this visit alone, and consented to this virtual pain psychology visit.     Visit modifiers:   GT for Interactive Technology and CR for catastrophe/disaster related due to coronavirus pandemic    REFERRING PHYSICIAN: Clarene Essex, MD    CHIEF COMPLAINT AND REASON FOR VISIT: pain coping skills, CBT to address depression and anxiety in the setting of chronic pain    SUBJECTIVE / HISTORY OF PRESENT ILLNESS: [name] is a very pleasant 64 y.o.  female from [place], [place] with multiple chronic pain complaints related to fibromyalgia and lupus who initially met with me in October 2016, and which time she was diagnosed with severe depression, PTSD, panic disorder, and generalized anxiety.The patient returns for a therapy session today. Last follow up with me was 09/30/2019.    Patient participated actively throughout her therapy session today.  Notes that pain is reasonably well managed.  Her knee pain improved after recent injections.  She was told she could have surgery but she would like to avoid this particularly not wanting to have surgery too early or have to cope with a difficult recovery.  Patient moved apartments last month.  She remains in the same complex, but a different building, with individuals who are closer to her age.  She is on the top floor, with no one above her.  In her prior apartment there were many family members and children, up all hours of the day and night making noise.  Patient noted she already feels a great sense of calmness and knees.  She was pleased that her son, nephew, and many of their friends came to help with the heavy lifting associated with the move.  She has been able to focus on pacing her self and taking breaks.  Retouched on pacing and activity rest cycling today related to the remaining unpacking work.  Patient also noted she has stayed busy watching her granddaughter during the day.  States she has not been able to even focus on her mood, and in general all of the pleasantness has helped her feel a greater sense of positivity.  Is hopeful for the future.  Overall feels mood has improved.  Spent extra time today addressing behavioral weight loss.  Patient notes she has been more active as a result of the move.  Also is looking forward to being more active in the spring, walking outside.  Since her last appointment, she has focused on eating healthier, and believes she has started losing weight although she has  not actually weighed herself.  She will continue with the same strategies, and we will follow-up in 5 weeks.    OBJECTIVE / MENTAL STATUS:    Appearance:   Appears stated age and Clean/Neat   Motor:  No abnormal movements   Speech/Language:   Normal rate, volume, tone, fluency   Mood:  Depressed and Anxious   Affect:  Blunted   Thought process:  Logical, linear, clear, coherent, goal directed   Thought content:    Denies SI, HI, self harm, delusions, obsessions, paranoid ideation, or ideas of reference   Perceptual disturbances:    Denies auditory and visual hallucinations, behavior not concerning for response to internal stimuli   Orientation:  Oriented to person, place, time, and general circumstances   Attention:  Able to fully attend without fluctuations in consciousness   Concentration:  Able to fully concentrate and attend   Memory:  Immediate, short-term, long-term, and recall grossly intact    Fund of knowledge:   Consistent with level of education and development   Insight:    Fair   Judgment:   Intact   Impulse Control:  Intact     DIAGNOSTIC IMPRESSION:   Post Traumatic Stress Disorder (PTSD)  Generalized Anxiety Disorder (GAD)  Panic Disorder  Major Depressive Disorder, moderate, recurrent  Chronic pain syndrome  Fibromyalgia  Lupus    ASSESSMENT:   [name] is a very pleasant 64 y.o.  female from [place], [place] with multiple chronic pain complaints related to lupus, arthritis, and fibromyalgia. She was previously seen in our clinic from 2012-2014, and reestablished care with Dr. Fayrene Fearing in August 2016. The patient is struggling with depression and anxiety, but is very motivated to participate in intensive, multidisciplinary treatment to address the connection between depression, anxiety and pain. She has a long-standing history of depression and anxiety, and has worked with outpatient psychiatrist and therapist over the years. She is currently established with [facility] psychiatry, and is tapering off of Klonopin.  Depression, anxiety, and panic have all improved.  Has been diagnosed with diabetes, and is managing it well with p.o. medication and dietary changes.  Had lost 50+ pounds but regained 20 lbs recently.  Patient presents with low-grade depression related to COVID and social isolation, but broadly she is doing better today after recently moving.       PLAN:   (1) Psychotherapy - Continue CBT. CBT will be used to address PTSD, MDD, Panic D/o, GAD, and chronic pain.     --Behavioral Activation - Encouraged behavioral activation.  Is doing great, walking 5 days/week  --Downloaded and uses Insight Timer, guided relaxation app, for nightly practice.  --Continues to utilize diaphragmatic breathing daily    (2) Psychiatry - Pt currently established with [facility].      (3) Safety - Pt denies any current or recent SI or safety concerns. Knows to call 911 or go to her local ED. Also previously given [facility].      (4) follow-up - with me in 5 weeks

## 2019-10-30 MED ORDER — CLONAZEPAM 0.5 MG TABLET
ORAL_TABLET | Freq: Every evening | ORAL | 2 refills | 30.00000 days | Status: CP
Start: 2019-10-30 — End: ?

## 2019-10-30 NOTE — Unmapped (Signed)
Refilled clonazepam 0.5mg  #15 with 2 refills. Seeing Ms. Denker next Tuesday 11/05/19.

## 2019-10-30 NOTE — Unmapped (Signed)
Fax received from [facility] requesting [facility]stance rx refills of Clonazepam 0.5 mg tablets.    Thank you,    Bradly Bienenstock

## 2019-11-05 ENCOUNTER — Encounter
Admit: 2019-11-05 | Discharge: 2019-11-06 | Payer: MEDICARE | Attending: Student in an Organized Health Care Education/Training Program | Primary: Student in an Organized Health Care Education/Training Program

## 2019-11-05 NOTE — Unmapped (Signed)
[facility]  Psychiatry Telehealth Encounter  Established Patient     I spent 31 minutes on the real-time audio and video with the patient. I spent an additional 10 minutes on pre- and post-visit activities.     The patient was physically located in [place] or a state in which I am permitted to provide care. The patient and/or parent/guardian understood that s/he may incur co-pays and cost sharing, and agreed to the telemedicine visit. The visit was reasonable and appropriate under the circumstances given the patient's presentation at the time.    The patient and/or parent/guardian has been advised of the potential risks and limitations of this mode of treatment (including, but not limited to, the absence of in-person examination) and has agreed to be treated using telemedicine. The patient's/patient's family's questions regarding telemedicine have been answered.     If the visit was completed in an ambulatory setting, the patient and/or parent/guardian has also been advised to contact their provider???s office for worsening conditions, and seek emergency medical treatment and/or call 911 if the patient deems either necessary.    Assessment:  Caitlin Smith is a 64 y.o. female with a history of MDD, GAD, and Panic Disorder in the context of many chronic medical conditions including chronic pain/fibromyalgia, osteoarthritis with degenerative disc disease, Lupus, pituitary tumor (benign), s/p right total knee arthroplasty, and VIN II (s/p resection), who presents for follow-up evaluation. She has been maintained on Lamictal for many years as well as cymbalta, trazodone, klonopin, and wellbutrin. Patient has previously tolerated a slight taper in her klonopin from 0.75 mg to 0.25 mg qHS overtime. Her mood is often exacerbated by steroid use for current treatment of pulmonary disease. Patient is engaged with CBT therapy at the pain clinic.    Met Caitlin Smith today 11/05/19 for epic video-visit.  She reports worsened anxiety and depression relative to her feelings on being Black in [place], and struggling with racial battle fatigue and injustices.  While she feels like her medicines are treating her well, she understandably feels overwhelmed by the current situation in the [place]. We discuss racial equity groups in her area as well as reading we could try to do together. Caitlin Smith reports that she will call clinicians and her children should she feel overwhelmed by her stress and anxiety or have any thoughts of SI, which she denies at today's visit.  Will follow-up in 2-3 months.     Risk Assessment:  A full suicide and violence risk assessment was performed as part of this patient's initial evaluation with [facility] outpatient psychiatry.  There is no new acute risk for suicide or violence at this time. The patient was educated about relevant modifiable risk factors including following recommendations for treatment of psychiatric illness and abstaining from substance abuse.         While future psychiatric events cannot be accurately predicted, the patient does not currently require acute inpatient psychiatric care and does not currently meet [facility] involuntary commitment criteria.     Plan:  For Next Visit 01/2020:   [ ]  Discuss book, mood and emotions all in relation to racial injustices  [ ]  Discuss tapering trazodone  [ ]  Discuss getting labs for continued monitoring of lamictal    # Depression - Anxiety - Panic Disorder    Status: Stable, with fluctuations appropriate to situation  - Continue Lamictal 400mg  qhs for mood  - Continue Cymbalta 120mg  qAM to target depressive sx and chronic pain  -  Continue Wellbutrin XL 150mg  qAM   - Continue Clonazepam 0.25mg  qHS, appropriate filling per [facility]  - Continue CBT at pain clinic    # Insomnia: Last sleep study performed in 2017. Per chart review, showed mild OSA likely related to medication use. Neurology recommends patient avoid sleeping on back as she likely has OSA when sleeping in this position    Status: Stable. Would like to continue to work to decrease polypharmacy in future, may focus on trazodone, especially as patient has found positive result in melatonin  --Continue melatonin 3-5mg  at bedtime   - Continue Trazodone 200mg  qHS for insomnia. May plan to discontinue in future if insomnia improves after discontinuation of Wellbutrin and initiation of melatonin.   - Clonazepam per above    # Mild Neurocognitive Impairment: Patient seen by Neurology ([facility]) 05/22/18 who dx pt with mild neurocognitive impairment w/ deficits in inattention and visual-spatial function with suspicion that sleep apnea and polypharmacy may be contributing to her difficulties. Recommended that psychopharm regimen be simplified, including eliminating trazodone and considering changing Wellbutrin to Zoloft or Lexapro for anxiety and eliminating Klonopin when stable.     Status: Ongoing, no new testing as of 11/2019, we have not been able to decrease clonazepam or discontinue wellbutrin. Pandemic and social atmosphere make changes in pharm more challenging at this time; may attempt to target trazodone first given improved sleep  - Will consider taper/discontinuation of Trazodone at next visit (01/2020) given improved sleep   - However, it is very likely that patient's lupus is significantly contributing to cognitive decline, which will limit improvement she will see with reduction in polypharmacy  --Last MOCA 22 on 12/12/2017    # Medical Monitoring - High Risk Medication Use  - The complexity of this patient's care involves drug therapy which requires intensive monitoring for toxicity. This is in part due to a narrow therapeutic window, as well as potential for toxicity. This patient is being treated with Lamotrigine (Lamictal) to target their psychiatric disorder, Major depressive disorder, refractory. In regards to this medication being considered high risk, Lamotrigine (lamictal) has black box warning for serious rash including fatal reaction of Stevens-Johnson syndrome and rare cases of toxic epidermal necrolysis. In addition to prolonged titration to mitigate risk of serious rash, lamotrigine requires monitoring of hepatic and renal function at baseline, and at times will require monitoring of lamotrigine blood levels.    Lamotrigine Monitoring  [ ]  Order hepatic testing, platelets at next visit, 01/2020  - Hepatic function testing, platelets (via CBC) q6 months  -AST/ALT WNL     Lab Results   Component Value Date    WBC 4.6 07/01/2019    HGB 14.7 07/01/2019    HCT 48.2 (H) 07/01/2019    PLT 333 07/01/2019       Lab Results   Component Value Date    NA 138 05/22/2018    K 5.1 (H) 05/22/2018    CL 100 05/22/2018    CO2 33.0 (H) 05/22/2018    BUN 12 04/08/2019    CREATININE 0.90 07/01/2019    GLU 99 05/22/2018    CALCIUM 9.3 05/22/2018    MG 2.1 12/31/2013    PHOS 4.9 (H) 12/31/2013       Lab Results   Component Value Date    BILITOT 0.4 05/22/2018    BILIDIR <0.10 05/01/2018    PROT 7.7 09/20/2018    ALBUMIN 4.0 09/20/2018    ALT 15 07/01/2019    AST 30  07/01/2019    ALKPHOS 74 05/22/2018    GGT 19 08/26/2012       Lab Results   Component Value Date    PT 12.0 09/17/2012    INR 1.11 01/03/2014    APTT 30.8 01/03/2014     # Return to Clinic: 2-3 months     Psychotherapy:  No billable psychotherapy service provided but brief supportive therapy was utilized.    Patient has been given this writer's contact information as well as the [facility] Psychiatry urgent line number. The patient has been instructed to call 911 for emergencies.    Patient and plan of care were discussed with the Attending MD,Dr. Cecilio Asper, who agrees with the above statement and plan.     Connye Burkitt MD    Subjective:     Psychiatric Chief Concern:  Follow-up psychiatric evaluation for depression, anxiety and insomnia.     Interval History: Ms. Crosley says that she has finally moved into her new apartment! After having difficulties with her apartment landlords she has finally got a new place and it is much quieter, which she appreciates. She says that the current polotical climate has been very disturbing to her. Specifically, she feels awful because of the treatment of Black Americans and discusses this with me. Discussed with Ms. Spegal's this writer's dismay over the injustices towards black Americans and provided active listening. We agreed to try to read some chapters of a book together about it. Provided her at previous visits with [place]'s Racial Equity Initiative website. Ms. Pastrana reports feeling depressed mood given her worries about being Black in [place]. Encouraged her to continue to speak with those people she trusts about this.     Social History: reviewed; patient recently moved apartments within the same complex    ROS:  As per Interval History and:  Constitutional:  no significant appetite change. Anxiety worsened d/t recent events.   Neuro:  none / negative    Objective:    Mental Status Exam:   Appearance  Patient in view of camera, appears stated age, well-nourished , well-groomed       Speech/Language:    Normal rate, volume, tone, fluency and Language intact, well formed   Affect  Sad, dysthymic   Mood:   Why does racism exist? It's so awful and I just don't understand   Thought process and Associations:   Logical, linear, clear, coherent, goal directed   Abnormal/psychotic thought content:     Denies SI, HI, self harm, delusions, obsessions, paranoid ideation, or ideas of reference   Perceptual disturbances:     Does not endorse auditory or visual hallucinations     Orientation:   Oriented to person, place, time, and general circumstances   Insight:     Intact   Judgment:    Intact   Impulse Control:   Intact     Physical Exam:   Gen: Patient in view of camera, sitting on cough INAD, wearing glasses  HEENT: Normocephalic, atraumatic  Resp: Does not appear to have increased WOB    Medications: reviewed at today's visit    Psychometrics:  Psych Scale Scores - Adult      Office Visit from 09/04/2018 in [facility]   **PHQ-9: Severity Measure for DEPRESSION Total Score**  20 Collected on 09/04/2018 0000   WHODAS 2.0 (Self-administered) - Total Score  42 Collected on 09/04/2018 0000          Posey Rea, MD

## 2019-11-06 ENCOUNTER — Ambulatory Visit (INDEPENDENT_AMBULATORY_CARE_PROVIDER_SITE_OTHER): Payer: Medicare Other

## 2019-11-06 VITALS — BP 154/90 | HR 83

## 2019-11-06 DIAGNOSIS — Z0130 Encounter for examination of blood pressure without abnormal findings: Secondary | ICD-10-CM

## 2019-11-06 NOTE — Progress Notes (Signed)
Patient came in because she has been having headaches all week with pain in her ear. She was worried that her BP maybe elevated. She has been taking Ibuprofen all week with no relief. She has also been experiencing some sob and dizziness. She has not missed any doses of her medication. She had not taking her medication for today. She would like for you to call her back so she want be worried all night.

## 2019-11-06 NOTE — Progress Notes (Signed)
She has not been able to check her bp at home. She said something is wrong with her cuff. She probably need a visit.  She was in a hurry did not have time for me to consult with you, just said for someone to call. I can have front desk to call and schedule a visit for her.

## 2019-11-07 ENCOUNTER — Ambulatory Visit (INDEPENDENT_AMBULATORY_CARE_PROVIDER_SITE_OTHER): Payer: Medicare Other | Admitting: Family Medicine

## 2019-11-07 ENCOUNTER — Other Ambulatory Visit: Payer: Self-pay

## 2019-11-07 ENCOUNTER — Encounter: Payer: Self-pay | Admitting: Family Medicine

## 2019-11-07 VITALS — BP 160/90 | HR 87 | Temp 97.1°F | Resp 16 | Ht 68.0 in | Wt 218.5 lb

## 2019-11-07 DIAGNOSIS — H9201 Otalgia, right ear: Secondary | ICD-10-CM

## 2019-11-07 DIAGNOSIS — R5383 Other fatigue: Secondary | ICD-10-CM | POA: Diagnosis not present

## 2019-11-07 DIAGNOSIS — I10 Essential (primary) hypertension: Secondary | ICD-10-CM | POA: Diagnosis not present

## 2019-11-07 MED ORDER — TELMISARTAN 80 MG PO TABS: 80.0000 mg | ORAL_TABLET | Freq: Every day | ORAL | 0 refills | Status: DC

## 2019-11-07 NOTE — Progress Notes (Signed)
Name: Nicole Ballard   MRN: [id number]    DOB: December 10, 1955   Date:11/07/2019       Progress Note  Subjective  Chief Complaint  Chief Complaint  Patient presents with  . Hypertension    BP has been elevated  . Fatigue    Just started this morning  . Ear Pain    Onset-1 week, off and on pain in her right ear. Achy feeling put peroxide in it which helped a little  . Follow-up    HPI  HTN: she has noticed that her bp has been elevated at home, usually controlled, and yesterday when she came in bp was 150's/90's and today it is elevated again, we will adjust dose of micardis to 80 mg and monitor  Right ear pain: she states today she is not feeling well, fatigue ( she takes care of granddaughter and daughter that works for school system). She states intermittent pain , when present is sharp, she has chronic ear loss on right, she always has dizziness but is worse now.  No fever or chills. She has noticed change in appetite in the past 4 days , she has intermittent cough, no rhinorrhea or congestion.    Patient Active Problem List   Diagnosis Date Noted  . Hyperglycemia 10/23/2017  . Vertigo 09/29/2016  . Bronchiolitis obliterans organizing pneumonia ([place]) 09/29/2016  . Neck pain, musculoskeletal 12/15/2015  . Anxiety about health 09/29/2015  . Abnormal CAT scan 04/11/2015  . Auditory impairment 04/11/2015  . Insomnia, persistent 04/11/2015  . Chronic kidney disease (CKD), stage I 04/11/2015  . Chronic nonmalignant pain 04/11/2015  . Chronic constipation 04/11/2015  . Dyslipidemia 04/11/2015  . Fibromyalgia 04/11/2015  . Gastro-esophageal reflux disease without esophagitis 04/11/2015  . Dysphonia 04/11/2015  . Absence of bladder continence 04/11/2015  . Low back pain 04/11/2015  . Eczema intertrigo 04/11/2015  . Keratosis pilaris 04/11/2015  . Chronic recurrent major depressive disorder ([place]) 04/11/2015  . Dysmetabolic syndrome [id number]  . Neurogenic claudication 04/11/2015   . Perennial allergic rhinitis with seasonal variation 04/11/2015  . Abnormal presence of protein in urine 04/11/2015  . Seborrhea capitis 04/11/2015  . Moderate dysplasia of vulva 04/11/2015  . Vitamin D deficiency 04/11/2015  . Treadmill stress test negative for angina pectoris 03/05/2015  . Shortness of breath 05/20/2014  . Asthma, chronic 12/31/2013  . H/O total knee replacement 12/31/2013  . Episcleritis 09/26/2013  . Vocal cord dysfunction 05/28/2013  . Anxiety, generalized 03/19/2013  . Back pain 12/18/2012  . Pulmonary nodules 11/26/2012  . Lung nodule, multiple 11/26/2012  . Arthritis, degenerative 11/01/2012  . H/O aspiration pneumonitis 10/22/2012  . Postinflammatory pulmonary fibrosis ([place]) 10/22/2012  . Mixed incontinence 05/04/2012  . Benign neoplasm of pituitary gland and craniopharyngeal duct ([place]) 12/29/2010  . Benign neoplasm of pituitary gland ([place]) 12/29/2010  . Lung involvement in systemic lupus erythematosus ([place]) 11/05/2010  . Chronic nausea 07/01/2008  . Benign hypertension 01/25/2005    Past Surgical History:  Procedure Laterality Date  . BRAIN SURGERY    . CHOLECYSTECTOMY    . VAGINAL HYSTERECTOMY      Family History  Problem Relation Age of Onset  . Breast cancer Mother   . Cancer Mother 76       Breast  . Cancer Father 59       Stomach  . Breast cancer Maternal Aunt     Social History   Tobacco Use  . Smoking status: Never Smoker  . Smokeless tobacco: Never Used  Substance Use Topics  . Alcohol use: No    Alcohol/week: 0.0 standard drinks     Current Outpatient Medications:  .  amLODipine (NORVASC) 2.5 MG tablet, TAKE 1 TABLET BY MOUTH  DAILY, Disp: 90 tablet, Rfl: 3 .  aspirin EC 81 MG tablet, Take 1 tablet (81 mg total) by mouth daily., Disp: 30 tablet, Rfl: 0 .  atorvastatin (LIPITOR) 40 MG tablet, TAKE 1 TABLET BY MOUTH  DAILY, Disp: 90 tablet, Rfl: 3 .  Biotin 1 MG CAPS, Take 1 capsule by mouth daily., Disp: 30 capsule, Rfl:  0 .  buPROPion (WELLBUTRIN XL) 150 MG 24 hr tablet, Take 1 tablet (150 mg total) by mouth daily., Disp: 30 tablet, Rfl: 0 .  Cholecalciferol (VITAMIN D) 2000 UNITS CAPS, Take 1 capsule by mouth daily., Disp: , Rfl:  .  clonazePAM (KLONOPIN) 0.5 MG tablet, Take 0.5 tablets (0.25 mg total) by mouth daily., Disp: 15 tablet, Rfl: 0 .  cyclobenzaprine (FLEXERIL) 5 MG tablet, TAKE 1 TABLET BY MOUTH  EVERY 8 HOURS AS NEEDED FOR MUSCLE SPASMS. DO NOT DRIVE FOR 8 HOURS, Disp: 90 tablet, Rfl: 1 .  diclofenac sodium (VOLTAREN) 1 % GEL, APP 2 GRAMS EXT AA QID, Disp: , Rfl: 6 .  DULoxetine (CYMBALTA) 60 MG capsule, Take 120 mg by mouth at bedtime. , Disp: , Rfl:  .  fluticasone (FLONASE) 50 MCG/ACT nasal spray, Place 2 sprays into both nostrils daily., Disp: 48 g, Rfl: 3 .  gabapentin (NEURONTIN) 300 MG capsule, Take 1 capsule by mouth at bedtime., Disp: , Rfl:  .  halobetasol (ULTRAVATE) 0.05 % ointment, Apply topically., Disp: , Rfl:  .  hydroxychloroquine (PLAQUENIL) 200 MG tablet, Take 200 mg by mouth 2 (two) times daily., Disp: , Rfl:  .  hydrOXYzine (ATARAX/VISTARIL) 10 MG tablet, Take 1 tablet (10 mg total) by mouth every 6 (six) hours as needed., Disp: 60 tablet, Rfl: 0 .  lamoTRIgine (LAMICTAL) 200 MG tablet, Take 1 tablet (200 mg total) by mouth 2 (two) times daily., Disp: 60 tablet, Rfl: 0 .  meclizine (ANTIVERT) 25 MG tablet, Take 1 tablet (25 mg total) by mouth 3 (three) times daily as needed., Disp: 30 tablet, Rfl: 0 .  Melatonin 10 MG TABS, Take 1 tablet by mouth at bedtime., Disp: , Rfl:  .  mycophenolate (MYFORTIC) 360 MG TBEC EC tablet, Take 360 mg by mouth 2 (two) times daily., Disp: , Rfl:  .  olopatadine (PATANOL) 0.1 % ophthalmic solution, Place 1 drop into both eyes 2 (two) times daily., Disp: 5 mL, Rfl: 3 .  omeprazole (PRILOSEC) 40 MG capsule, TAKE 1 CAPSULE BY MOUTH  DAILY, Disp: 90 capsule, Rfl: 3 .  ondansetron (ZOFRAN) 4 MG tablet, Take 1 tablet (4 mg total) by mouth every 8 (eight)  hours as needed for nausea or vomiting., Disp: 90 tablet, Rfl: 0 .  Polyethylene Glycol 3350 (MIRALAX PO), Take by mouth., Disp: , Rfl:  .  telmisartan (MICARDIS) 40 MG tablet, TAKE 1 TABLET BY MOUTH  DAILY, Disp: 90 tablet, Rfl: 1 .  traZODone (DESYREL) 100 MG tablet, Take 200 mg by mouth at bedtime. , Disp: , Rfl:  .  triamcinolone ointment (KENALOG) 0.1 %, , Disp: , Rfl:   Allergies  Allergen Reactions  . Ace Inhibitors     Other reaction(s): OTHER Pt states she can not take ace inhibitors. Pt states she can not remember her reaction.   . Bee Pollen   . Penicillins   . Pollen  Extract     I personally reviewed active problem list, medication list, allergies, family history, social history with the patient/caregiver today.   ROS  Ten systems reviewed and is negative except as mentioned in HPI  Objective  Vitals:   11/07/19 1155  BP: (!) 158/84  Pulse: 87  Resp: 16  Temp: (!) 97.1 F (36.2 C)  TempSrc: Temporal  SpO2: 95%  Weight: 218 lb 8 oz (99.1 kg)  Height: 5\' 8"  (1.727 m)    Body mass index is 33.22 kg/m.  Physical Exam  Constitutional: Patient appears well-developed and well-nourished. Obese  No distress.  HEENT: head atraumatic, normocephalic, pupils equal and reactive to light, ears : wax on right ear but flaky, neck supple, throat within normal limits Cardiovascular: Normal rate, regular rhythm and normal heart sounds.  No murmur heard. No BLE edema. Pulmonary/Chest: Effort normal and breath sounds normal. No respiratory distress. Abdominal: Soft.  There is no tenderness. Psychiatric: Patient has a normal mood and affect. behavior is normal. Judgment and thought content normal.  Recent Results (from the past 2160 hour(s))  POCT HgB A1C     Status: Normal   Collection Time: 10/09/19  8:15 AM  Result Value Ref Range   Hemoglobin A1C 5.5 4.0 - 5.6 %   HbA1c POC (<> result, manual entry)     HbA1c, POC (prediabetic range)     HbA1c, POC (controlled  diabetic range)       PHQ2/9: Depression screen [facility] 2/9 11/07/2019 10/09/2019 08/09/2019 07/17/2019 06/10/2019  Decreased Interest 1 1 0 1 1  Down, Depressed, Hopeless 1 1 0 0 2  PHQ - 2 Score 2 2 0 1 3  Altered sleeping 0 0 0 0 3  Tired, decreased energy 2 2 0 0 1  Change in appetite 3 3 0 0 2  Feeling bad or failure about yourself  3 3 0 0 3  Trouble concentrating 3 3 3  0 0  Moving slowly or fidgety/restless 2 2 1  0 2  Suicidal thoughts 0 0 0 0 0  PHQ-9 Score 15 15 4 1 14   Difficult doing work/chores Somewhat difficult Somewhat difficult Not difficult at all Not difficult at all Very difficult  Some recent data might be hidden    phq 9 is positive   Fall Risk: Fall Risk  11/07/2019 10/09/2019 08/09/2019 07/17/2019 06/10/2019  Falls in the past year? 0 0 0 1 1  Number falls in past yr: 0 0 0 1 1  Comment - - - - -  Injury with Fall? 0 0 0 0 0  Risk for fall due to : - - Impaired balance/gait Impaired balance/gait Impaired balance/gait  Risk for fall due to: Comment - - - - -  Follow up - - Falls prevention discussed - -     Functional Status Survey: Is the patient deaf or have difficulty hearing?: Yes Does the patient have difficulty seeing, even when wearing glasses/contacts?: No Does the patient have difficulty concentrating, remembering, or making decisions?: No Does the patient have difficulty walking or climbing stairs?: No Does the patient have difficulty dressing or bathing?: No Does the patient have difficulty doing errands alone such as visiting a doctor's office or shopping?: No   Assessment & Plan  1. Right ear pain  Advised peroxide and warm water  2. Fatigue, unspecified type  Monitor for now, discussed COVID symptoms   3. Benign hypertension  We will adjust dose of micardis 80 mg daily and monitor  -  COMPLETE METABOLIC PANEL WITH GFR - CBC with Differential/Platelet - TSH - Microalbumin / creatinine urine ratio - telmisartan (MICARDIS) 80 MG tablet;  Take 1 tablet (80 mg total) by mouth daily.  Dispense: 30 tablet; Refill: 0

## 2019-11-08 ENCOUNTER — Encounter: Payer: Self-pay | Admitting: Family Medicine

## 2019-11-08 ENCOUNTER — Ambulatory Visit: Payer: Medicare Other

## 2019-11-08 VITALS — BP 144/76 | HR 77 | Resp 16 | Wt 218.6 lb

## 2019-11-08 DIAGNOSIS — I10 Essential (primary) hypertension: Secondary | ICD-10-CM

## 2019-11-08 LAB — CBC WITH DIFFERENTIAL/PLATELET
Absolute Monocytes: 545 cells/uL (ref 200–950)
Basophils Absolute: 70 cells/uL (ref 0–200)
Basophils Relative: 1.7 %
Eosinophils Absolute: 90 cells/uL (ref 15–500)
Eosinophils Relative: 2.2 %
HCT: 40.9 % (ref 35.0–45.0)
Hemoglobin: 13.6 g/dL (ref 11.7–15.5)
Lymphs Abs: 1427 cells/uL (ref 850–3900)
MCH: 28.3 pg (ref 27.0–33.0)
MCHC: 33.3 g/dL (ref 32.0–36.0)
MCV: 85 fL (ref 80.0–100.0)
MPV: 9.8 fL (ref 7.5–12.5)
Monocytes Relative: 13.3 %
Neutro Abs: 1968 cells/uL (ref 1500–7800)
Neutrophils Relative %: 48 %
Platelets: 341 10*3/uL (ref 140–400)
RBC: 4.81 10*6/uL (ref 3.80–5.10)
RDW: 13.8 % (ref 11.0–15.0)
Total Lymphocyte: 34.8 %
WBC: 4.1 10*3/uL (ref 3.8–10.8)

## 2019-11-08 LAB — TSH: TSH: 1.23 mIU/L (ref 0.40–4.50)

## 2019-11-08 LAB — MICROALBUMIN / CREATININE URINE RATIO
Creatinine, Urine: 62 mg/dL (ref 20–275)
Microalb Creat Ratio: 10 mcg/mg creat (ref <30)
Microalb, Ur: 0.6 mg/dL

## 2019-11-08 LAB — COMPLETE METABOLIC PANEL WITH GFR
AG Ratio: 1.1 (calc) (ref 1.0–2.5)
ALT: 23 U/L (ref 6–29)
AST: 24 U/L (ref 10–35)
Albumin: 3.9 g/dL (ref 3.6–5.1)
Alkaline phosphatase (APISO): 75 U/L (ref 37–153)
BUN: 12 mg/dL (ref 7–25)
CO2: 29 mmol/L (ref 20–32)
Calcium: 9.3 mg/dL (ref 8.6–10.4)
Chloride: 105 mmol/L (ref 98–110)
Creat: 0.73 mg/dL (ref 0.50–0.99)
GFR, Est African American: 103 mL/min/{1.73_m2} (ref >=60)
GFR, Est Non African American: 89 mL/min/{1.73_m2} (ref >=60)
Globulin: 3.5 g/dL (calc) (ref 1.9–3.7)
Glucose, Bld: 74 mg/dL (ref 65–99)
Potassium: 4.8 mmol/L (ref 3.5–5.3)
Sodium: 139 mmol/L (ref 135–146)
Total Bilirubin: 0.4 mg/dL (ref 0.2–1.2)
Total Protein: 7.4 g/dL (ref 6.1–8.1)

## 2019-11-08 NOTE — Telephone Encounter (Signed)
Copied from [place] [id number]. Topic: General - Other >> Nov 08, 2019  8:03 AM Nicole Ballard wrote: Reason for CRM: Pt called stating that her BP is still high 160/97 and is requesting to know what she should do about it. Please advise.

## 2019-11-08 NOTE — Telephone Encounter (Signed)
Copied from [place] [id number] [phone]. Topic: General - Other >> Nov 08, 2019  8:03 AM Celene Kras wrote: Reason for CRM: Pt called stating that her BP is still high 160/97 and is requesting to know what she should do about it. Please advise.

## 2019-11-08 NOTE — Progress Notes (Signed)
Patient is here for a blood pressure check. Patient denies chest pain, palpitations, shortness of breath or visual disturbances. At previous visit blood pressure was 160/90 with a heart rate of 87. Today during nurse visit first check blood pressure was 148/78. After resting for 10 minutes it was 144/76 and heart rate was 77. She does take any blood pressure medications.  States she went to Nephrologist and the nurse there checked her BP and it was 198/?Marland Kitchen

## 2019-11-09 ENCOUNTER — Ambulatory Visit
Admit: 2019-11-09 | Discharge: 2019-11-10 | Disposition: A | Discharge status: Home / Self Care | Payer: MEDICARE | Attending: Emergency Medicine

## 2019-11-09 ENCOUNTER — Emergency Department
Admit: 2019-11-09 | Discharge: 2019-11-10 | Disposition: A | Discharge status: Home / Self Care | Payer: MEDICARE | Attending: Emergency Medicine

## 2019-11-09 DIAGNOSIS — N189 Chronic kidney disease, unspecified: Secondary | ICD-10-CM | POA: Diagnosis not present

## 2019-11-09 DIAGNOSIS — R202 Paresthesia of skin: Secondary | ICD-10-CM | POA: Diagnosis not present

## 2019-11-09 DIAGNOSIS — I129 Hypertensive chronic kidney disease with stage 1 through stage 4 chronic kidney disease, or unspecified chronic kidney disease: Secondary | ICD-10-CM | POA: Diagnosis not present

## 2019-11-09 DIAGNOSIS — I499 Cardiac arrhythmia, unspecified: Secondary | ICD-10-CM | POA: Diagnosis not present

## 2019-11-09 DIAGNOSIS — R079 Chest pain, unspecified: Secondary | ICD-10-CM | POA: Diagnosis not present

## 2019-11-09 DIAGNOSIS — I10 Essential (primary) hypertension: Secondary | ICD-10-CM | POA: Diagnosis not present

## 2019-11-09 DIAGNOSIS — R110 Nausea: Secondary | ICD-10-CM | POA: Diagnosis not present

## 2019-11-09 DIAGNOSIS — M329 Systemic lupus erythematosus, unspecified: Secondary | ICD-10-CM | POA: Diagnosis not present

## 2019-11-09 DIAGNOSIS — M797 Fibromyalgia: Secondary | ICD-10-CM | POA: Diagnosis not present

## 2019-11-09 LAB — COMPREHENSIVE METABOLIC PANEL
ALBUMIN: 4.5 g/dL (ref 3.5–5.0)
ALKALINE PHOSPHATASE: 98 U/L (ref 38–126)
ALT (SGPT): 32 U/L (ref <35)
ANION GAP: 12 mmol/L (ref 7–15)
AST (SGOT): 39 U/L — ABNORMAL HIGH (ref 14–38)
BILIRUBIN TOTAL: 0.7 mg/dL (ref 0.0–1.2)
BUN / CREAT RATIO: 15
CALCIUM: 9.8 mg/dL (ref 8.5–10.2)
CHLORIDE: 100 mmol/L (ref 98–107)
CO2: 29 mmol/L (ref 22.0–30.0)
CREATININE: 0.89 mg/dL (ref 0.60–1.00)
EGFR CKD-EPI AA FEMALE: 81 mL/min/{1.73_m2} (ref >=60)
EGFR CKD-EPI NON-AA FEMALE: 70 mL/min/{1.73_m2} (ref >=60)
GLUCOSE RANDOM: 74 mg/dL (ref 70–179)
POTASSIUM: 4.3 mmol/L (ref 3.5–5.0)
SODIUM: 141 mmol/L (ref 135–145)

## 2019-11-09 LAB — CBC W/ AUTO DIFF
BASOPHILS RELATIVE PERCENT: 0.8 %
EOSINOPHILS ABSOLUTE COUNT: 0.1 10*9/L (ref 0.0–0.7)
EOSINOPHILS RELATIVE PERCENT: 2 %
HEMATOCRIT: 43.4 % (ref 35.0–44.0)
HEMOGLOBIN: 14.5 g/dL (ref 12.0–15.5)
LYMPHOCYTES ABSOLUTE COUNT: 1.9 10*9/L (ref 0.7–4.0)
LYMPHOCYTES RELATIVE PERCENT: 37 %
MEAN CORPUSCULAR HEMOGLOBIN CONC: 33.5 g/dL (ref 30.0–36.0)
MEAN CORPUSCULAR HEMOGLOBIN: 28.7 pg (ref 26.0–34.0)
MEAN CORPUSCULAR VOLUME: 85.6 fL (ref 82.0–98.0)
MEAN PLATELET VOLUME: 7.5 fL (ref 7.0–10.0)
MONOCYTES ABSOLUTE COUNT: 0.6 10*9/L (ref 0.1–1.0)
MONOCYTES RELATIVE PERCENT: 12.5 %
NEUTROPHILS ABSOLUTE COUNT: 2.4 10*9/L (ref 1.7–7.7)
NEUTROPHILS RELATIVE PERCENT: 47.7 %
PLATELET COUNT: 353 10*9/L (ref 150–450)
RED BLOOD CELL COUNT: 5.07 10*12/L — ABNORMAL HIGH (ref 3.90–5.03)
WBC ADJUSTED: 5.1 10*9/L (ref 3.5–10.5)

## 2019-11-09 LAB — LIPASE: Triacylglycerol lipase:CCnc:Pt:Ser/Plas:Qn:: 93

## 2019-11-09 LAB — ALBUMIN: Albumin:MCnc:Pt:Ser/Plas:Qn:: 4.5

## 2019-11-09 LAB — NEUTROPHILS ABSOLUTE COUNT: Neutrophils:NCnc:Pt:Bld:Qn:Automated count: 2.4

## 2019-11-09 LAB — TROPONIN I
Troponin I.cardiac:MCnc:Pt:Ser/Plas:Qn:: <0.06
Troponin I.cardiac:MCnc:Pt:Ser/Plas:Qn:: <0.06

## 2019-11-09 LAB — D-DIMER QUANTITATIVE (CH,ML,PD,ET): Fibrin D-dimer DDU:MCnc:Pt:PPP:Qn:: 215
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
## 2019-11-09 NOTE — Unmapped (Signed)
Pt reports that her BP has been between 140-190 over the last 2-3 days. Assoc chest discomfort, arm tingling/numbness bilat. Denies fevers, decreased UO, vision changes.

## 2019-11-10 ENCOUNTER — Encounter: Payer: Self-pay | Admitting: Family Medicine

## 2019-11-10 NOTE — Unmapped (Signed)
Received sign out from Dr. Vertell Novak    ED I-PASS Handoff  ?? Illness Severit: stable  ?? Patient Summary: Caitlin Smith is a 64 y.o. female with a PMH of HTN, lupus, arthritis, CKD, fibromyalgia, and generalized anxiety disorder who presented to the ED for evaluation of hypertension with associated chest pain, nausea, and left hand tingling earlier today which have since resolved.   ?? Action List: Will repeat troponin at 18:00. Her medication was just changed by her PCP, but will advise her to continue taking new dosage.   ?? Situation Awareness (Contingency Planning): Anticipate discharge after repeat troponin. If troponin is elevated, then reevaluate.   ?? Synthesis by Receiver    20:15 Troponin negative, patient discharged home with PCP follow up    Vitals:  Vitals:    11/09/19 1905 11/09/19 1930 11/09/19 1944 11/09/19 2000   BP: 185/99 157/109 166/98 167/93   Pulse: 69 72 69 67   Resp: 18 18 20 19    SpO2: 96% 96% 97% 95%   Weight:           Work-up:  Labs Reviewed   COMPREHENSIVE METABOLIC PANEL - Abnormal; Notable for the following components:       Result Value    Total Protein 8.7 (*)     AST 39 (*)     All other components within normal limits   CBC W/ AUTO DIFF - Abnormal; Notable for the following components:    RBC 5.07 (*)     All other components within normal limits   LIPASE - Normal   TROPONIN I - Normal   TROPONIN I - Normal   D-DIMER, QUANTITATIVE - Normal    Narrative:     When used in conjunction with a clinical Pre-Test Probability assessment to exclude the venous thromboembolism (VTE) in outpatients suspected of deep vein thrombosis (DVT) and/or pulmonary embolism (PE), a cut-off of <230ng/mL is??recommended.  Note: Due to the lack of an International Reference Standard some manufacturers and literature references express D-dimer results in FEU (Fibrinogen Equivalent Units), while the above results are expressed in D-DU (D-dimer Units). The conversion is 2 FEU = 1 D-DU, so some literature may quote a cut-off value approximately double that shown above.      CBC W/ DIFFERENTIAL    Narrative:     The following orders were created for panel order CBC w/ Differential.  Procedure                               Abnormality         Status                     ---------                               -----------         ------                     CBC w/ Differential[[id number]]         Abnormal            Final result                 Please view results for these tests on the individual orders.       XR Chest Portable   Final Result  Hypoinflated lungs. No acute airspace disease.         Documentation assistance was provided by Halina Maidens, Scribe on November 09, 2019 at 8:25 PM for Alfredia Ferguson MD.     November 10, 2019 1:38 AM. Documentation assistance provided by the scribe. I was present during the time the encounter was recorded. The information recorded by the scribe was done at my direction and has been reviewed and validated by me. Aws Shere, DO

## 2019-11-11 ENCOUNTER — Encounter: Payer: Self-pay | Admitting: Family Medicine

## 2019-11-11 ENCOUNTER — Ambulatory Visit (INDEPENDENT_AMBULATORY_CARE_PROVIDER_SITE_OTHER): Payer: Medicare Other | Admitting: Family Medicine

## 2019-11-11 ENCOUNTER — Other Ambulatory Visit: Payer: Self-pay

## 2019-11-11 VITALS — BP 133/85 | HR 106 | Temp 96.9°F | Resp 16 | Ht 68.0 in | Wt 214.0 lb

## 2019-11-11 DIAGNOSIS — H6123 Impacted cerumen, bilateral: Secondary | ICD-10-CM

## 2019-11-11 DIAGNOSIS — I10 Essential (primary) hypertension: Secondary | ICD-10-CM | POA: Diagnosis not present

## 2019-11-11 DIAGNOSIS — H9201 Otalgia, right ear: Secondary | ICD-10-CM

## 2019-11-11 DIAGNOSIS — R519 Headache, unspecified: Secondary | ICD-10-CM

## 2019-11-11 NOTE — Progress Notes (Signed)
Name: Nicole Ballard   MRN: [id number]    DOB: [birth year]   Date:11/11/2019       Progress Note  Subjective  Chief Complaint  Chief Complaint  Patient presents with  . Hypertension    Blood pressure continues to be elevated since last nurse vist. It has been in the range of 190/103. She spent half day Saturday in the ED trying to get her BP down. She has a constant headache and ear pain and fatigue. She has been taking her medication as prescribed with no missed doses. She has not taken any today.    HPI   HTN: she states she was having a throbbing headache on Saturday and pain on right ear, some chest pain, left hand tingling, she checked her bp at pharmacy and it was 172/107, she went to [facility] at [facility] and when she arrived her bp was  19 /100She did not take Micardis that morning, she states she was given a dose when she arrived to [facility], she was also given aspirin 81 mg. When she left EC her bp was 167/93 . She was advised not to take bp all the time. She states she slept well last night, but after she woke up she noticed headache nagging dull frontal headache again and right ear pain. She used some ear drops without help. She brought her bp monitor today and it was the same reading and back to normal range. She has not taken her micardis yet    Patient Active Problem List   Diagnosis Date Noted  . Hyperglycemia 10/23/2017  . Vertigo 09/29/2016  . Bronchiolitis obliterans organizing pneumonia ([place]) 09/29/2016  . Neck pain, musculoskeletal 12/15/2015  . Anxiety about health 09/29/2015  . Abnormal CAT scan 04/11/2015  . Auditory impairment 04/11/2015  . Insomnia, persistent 04/11/2015  . Chronic kidney disease (CKD), stage I 04/11/2015  . Chronic nonmalignant pain 04/11/2015  . Chronic constipation 04/11/2015  . Dyslipidemia 04/11/2015  . Fibromyalgia 04/11/2015  . Gastro-esophageal reflux disease without esophagitis 04/11/2015  . Dysphonia 04/11/2015  . Absence of bladder  continence 04/11/2015  . Low back pain 04/11/2015  . Eczema intertrigo 04/11/2015  . Keratosis pilaris 04/11/2015  . Chronic recurrent major depressive disorder ([place]) 04/11/2015  . Dysmetabolic syndrome 123456  . Neurogenic claudication 04/11/2015  . Perennial allergic rhinitis with seasonal variation 04/11/2015  . Abnormal presence of protein in urine 04/11/2015  . Seborrhea capitis 04/11/2015  . Moderate dysplasia of vulva 04/11/2015  . Vitamin D deficiency 04/11/2015  . Treadmill stress test negative for angina pectoris 03/05/2015  . Shortness of breath 05/20/2014  . Asthma, chronic 12/31/2013  . H/O total knee replacement 12/31/2013  . Episcleritis 09/26/2013  . Vocal cord dysfunction 05/28/2013  . Anxiety, generalized 03/19/2013  . Back pain 12/18/2012  . Pulmonary nodules 11/26/2012  . Lung nodule, multiple 11/26/2012  . Arthritis, degenerative 11/01/2012  . H/O aspiration pneumonitis 10/22/2012  . Postinflammatory pulmonary fibrosis ([place]) 10/22/2012  . Mixed incontinence 05/04/2012  . Benign neoplasm of pituitary gland and craniopharyngeal duct ([place]) 12/29/2010  . Benign neoplasm of pituitary gland ([place]) 12/29/2010  . Lung involvement in systemic lupus erythematosus ([place]) 11/05/2010  . Chronic nausea 07/01/2008  . Benign hypertension 01/25/2005    Past Surgical History:  Procedure Laterality Date  . BRAIN SURGERY    . CHOLECYSTECTOMY    . VAGINAL HYSTERECTOMY      Family History  Problem Relation Age of Onset  . Breast cancer Mother   . Cancer  Mother 58       Breast  . Cancer Father 3       Stomach  . Breast cancer Maternal Aunt     Social History   Tobacco Use  . Smoking status: Never Smoker  . Smokeless tobacco: Never Used  Substance Use Topics  . Alcohol use: No    Alcohol/week: 0.0 standard drinks     Current Outpatient Medications:  .  amLODipine (NORVASC) 2.5 MG tablet, TAKE 1 TABLET BY MOUTH  DAILY, Disp: 90 tablet, Rfl: 3 .  aspirin  EC 81 MG tablet, Take 1 tablet (81 mg total) by mouth daily., Disp: 30 tablet, Rfl: 0 .  atorvastatin (LIPITOR) 40 MG tablet, TAKE 1 TABLET BY MOUTH  DAILY, Disp: 90 tablet, Rfl: 3 .  Biotin 1 MG CAPS, Take 1 capsule by mouth daily., Disp: 30 capsule, Rfl: 0 .  buPROPion (WELLBUTRIN XL) 150 MG 24 hr tablet, Take 1 tablet (150 mg total) by mouth daily., Disp: 30 tablet, Rfl: 0 .  Cholecalciferol (VITAMIN D) 2000 UNITS CAPS, Take 1 capsule by mouth daily., Disp: , Rfl:  .  clonazePAM (KLONOPIN) 0.5 MG tablet, Take 0.5 tablets (0.25 mg total) by mouth daily., Disp: 15 tablet, Rfl: 0 .  cyclobenzaprine (FLEXERIL) 5 MG tablet, TAKE 1 TABLET BY MOUTH  EVERY 8 HOURS AS NEEDED FOR MUSCLE SPASMS. DO NOT DRIVE FOR 8 HOURS, Disp: 90 tablet, Rfl: 1 .  diclofenac sodium (VOLTAREN) 1 % GEL, APP 2 GRAMS EXT AA QID, Disp: , Rfl: 6 .  DULoxetine (CYMBALTA) 60 MG capsule, Take 120 mg by mouth at bedtime. , Disp: , Rfl:  .  fluticasone (FLONASE) 50 MCG/ACT nasal spray, Place 2 sprays into both nostrils daily., Disp: 48 g, Rfl: 3 .  gabapentin (NEURONTIN) 300 MG capsule, Take 1 capsule by mouth at bedtime., Disp: , Rfl:  .  halobetasol (ULTRAVATE) 0.05 % ointment, Apply topically., Disp: , Rfl:  .  hydroxychloroquine (PLAQUENIL) 200 MG tablet, Take 200 mg by mouth 2 (two) times daily., Disp: , Rfl:  .  hydrOXYzine (ATARAX/VISTARIL) 10 MG tablet, Take 1 tablet (10 mg total) by mouth every 6 (six) hours as needed., Disp: 60 tablet, Rfl: 0 .  lamoTRIgine (LAMICTAL) 200 MG tablet, Take 1 tablet (200 mg total) by mouth 2 (two) times daily., Disp: 60 tablet, Rfl: 0 .  meclizine (ANTIVERT) 25 MG tablet, Take 1 tablet (25 mg total) by mouth 3 (three) times daily as needed., Disp: 30 tablet, Rfl: 0 .  Melatonin 10 MG TABS, Take 1 tablet by mouth at bedtime., Disp: , Rfl:  .  mycophenolate (MYFORTIC) 360 MG TBEC EC tablet, Take 360 mg by mouth 2 (two) times daily., Disp: , Rfl:  .  olopatadine (PATANOL) 0.1 % ophthalmic  solution, Place 1 drop into both eyes 2 (two) times daily., Disp: 5 mL, Rfl: 3 .  omeprazole (PRILOSEC) 40 MG capsule, TAKE 1 CAPSULE BY MOUTH  DAILY, Disp: 90 capsule, Rfl: 3 .  ondansetron (ZOFRAN) 4 MG tablet, Take 1 tablet (4 mg total) by mouth every 8 (eight) hours as needed for nausea or vomiting., Disp: 90 tablet, Rfl: 0 .  Polyethylene Glycol 3350 (MIRALAX PO), Take by mouth., Disp: , Rfl:  .  telmisartan (MICARDIS) 80 MG tablet, Take 1 tablet (80 mg total) by mouth daily., Disp: 30 tablet, Rfl: 0 .  traZODone (DESYREL) 100 MG tablet, Take 200 mg by mouth at bedtime. , Disp: , Rfl:  .  triamcinolone ointment (KENALOG)  0.1 %, , Disp: , Rfl:   Allergies  Allergen Reactions  . Ace Inhibitors     Other reaction(s): OTHER Pt states she can not take ace inhibitors. Pt states she can not remember her reaction.   . Bee Pollen   . Penicillins   . Pollen Extract     I personally reviewed active problem list, medication list, allergies, family history, social history, health maintenance with the patient/caregiver today.   ROS  Constitutional: Negative for fever or weight change.  Respiratory: Negative for cough and shortness of breath.   Cardiovascular: Negative for chest pain or palpitations.  Gastrointestinal: Negative for abdominal pain, no bowel changes.  Musculoskeletal: Negative for gait problem or joint swelling.  Skin: Negative for rash.  Neurological: Negative for dizziness , positive for headache.  No other specific complaints in a complete review of systems (except as listed in HPI above).  Objective  Vitals:   11/11/19 1005 11/11/19 1011  BP: 130/86 133/85  Pulse: (!) 106   Resp: 16   Temp: (!) 96.9 F (36.1 C)   TempSrc: Temporal   SpO2: 96%   Weight: 214 lb (97.1 kg)   Height: 5\' 8"  (1.727 m)     Body mass index is 32.54 kg/m.  Physical Exam  Constitutional: Patient appears well-developed and well-nourished. Obese  No distress.  HEENT: head atraumatic,  normocephalic, pupils equal and reactive to light, ears bilateral cerumen impaction, lavage on right did not completely removed it Cardiovascular: Normal rate, regular rhythm and normal heart sounds.  No murmur heard. No BLE edema. Pulmonary/Chest: Effort normal and breath sounds normal. No respiratory distress. Abdominal: Soft.  There is no tenderness. Psychiatric: Patient has a normal mood and affect. behavior is normal. Judgment and thought content normal.   Recent Results (from the past 2160 hour(s))  POCT HgB A1C     Status: Normal   Collection Time: 10/09/19  8:15 AM  Result Value Ref Range   Hemoglobin A1C 5.5 4.0 - 5.6 %   HbA1c POC (<> result, manual entry)     HbA1c, POC (prediabetic range)     HbA1c, POC (controlled diabetic range)    COMPLETE METABOLIC PANEL WITH GFR     Status: None   Collection Time: 11/07/19 12:41 PM  Result Value Ref Range   Glucose, Bld 74 65 - 99 mg/dL    Comment: .            Fasting reference interval .    BUN 12 7 - 25 mg/dL   Creat 0.73 0.50 - 0.99 mg/dL    Comment: For patients >9 years of age, the reference limit for Creatinine is approximately 13% higher for people identified as African-American. .    GFR, Est Non African American 89 > OR = 60 mL/min/1.28m2   GFR, Est African American 103 > OR = 60 mL/min/1.55m2   BUN/Creatinine Ratio NOT APPLICABLE 6 - 22 (calc)   Sodium 139 135 - 146 mmol/L   Potassium 4.8 3.5 - 5.3 mmol/L   Chloride 105 98 - 110 mmol/L   CO2 29 20 - 32 mmol/L   Calcium 9.3 8.6 - 10.4 mg/dL   Total Protein 7.4 6.1 - 8.1 g/dL   Albumin 3.9 3.6 - 5.1 g/dL   Globulin 3.5 1.9 - 3.7 g/dL (calc)   AG Ratio 1.1 1.0 - 2.5 (calc)   Total Bilirubin 0.4 0.2 - 1.2 mg/dL   Alkaline phosphatase (APISO) 75 37 - 153 U/L   AST 24  10 - 35 U/L   ALT 23 6 - 29 U/L  CBC with Differential/Platelet     Status: None   Collection Time: 11/07/19 12:41 PM  Result Value Ref Range   WBC 4.1 3.8 - 10.8 Thousand/uL   RBC 4.81 3.80 -  5.10 Million/uL   Hemoglobin 13.6 11.7 - 15.5 g/dL   HCT 40.9 35.0 - 45.0 %   MCV 85.0 80.0 - 100.0 fL   MCH 28.3 27.0 - 33.0 pg   MCHC 33.3 32.0 - 36.0 g/dL   RDW 13.8 11.0 - 15.0 %   Platelets 341 140 - 400 Thousand/uL   MPV 9.8 7.5 - 12.5 fL   Neutro Abs 1,968 1,500 - 7,800 cells/uL   Lymphs Abs 1,427 850 - 3,900 cells/uL   Absolute Monocytes 545 200 - 950 cells/uL   Eosinophils Absolute 90 15 - 500 cells/uL   Basophils Absolute 70 0 - 200 cells/uL   Neutrophils Relative % 48 %   Total Lymphocyte 34.8 %   Monocytes Relative 13.3 %   Eosinophils Relative 2.2 %   Basophils Relative 1.7 %  TSH     Status: None   Collection Time: 11/07/19 12:41 PM  Result Value Ref Range   TSH 1.23 0.40 - 4.50 mIU/L  Microalbumin / creatinine urine ratio     Status: None   Collection Time: 11/07/19 12:41 PM  Result Value Ref Range   Creatinine, Urine 62 20 - 275 mg/dL   Microalb, Ur 0.6 mg/dL    Comment: Reference Range Not established    Microalb Creat Ratio 10 <30 mcg/mg creat    Comment: . The ADA defines abnormalities in albumin excretion as follows: Marland Kitchen Category         Result (mcg/mg creatinine) . Normal                    <30 Microalbuminuria         30-299  Clinical albuminuria   > OR = 300 . The ADA recommends that at least two of three specimens collected within a 3-6 month period be abnormal before considering a patient to be within a diagnostic category.      PHQ2/9: Depression screen [facility] 2/9 11/11/2019 11/07/2019 10/09/2019 08/09/2019 07/17/2019  Decreased Interest 3 1 1  0 1  Down, Depressed, Hopeless 1 1 1  0 0  PHQ - 2 Score 4 2 2  0 1  Altered sleeping 0 0 0 0 0  Tired, decreased energy 1 2 2  0 0  Change in appetite 3 3 3  0 0  Feeling bad or failure about yourself  0 3 3 0 0  Trouble concentrating 0 3 3 3  0  Moving slowly or fidgety/restless 0 2 2 1  0  Suicidal thoughts 0 0 0 0 0  PHQ-9 Score 8 15 15 4 1   Difficult doing work/chores Somewhat difficult Somewhat  difficult Somewhat difficult Not difficult at all Not difficult at all  Some recent data might be hidden    phq 9 is positive   Fall Risk: Fall Risk  11/11/2019 11/07/2019 10/09/2019 08/09/2019 07/17/2019  Falls in the past year? 0 0 0 0 1  Number falls in past yr: 0 0 0 0 1  Comment - - - - -  Injury with Fall? 0 0 0 0 0  Risk for fall due to : - - - Impaired balance/gait Impaired balance/gait  Risk for fall due to: Comment - - - - -  Follow up - - -  Falls prevention discussed -    Functional Status Survey: Is the patient deaf or have difficulty hearing?: Yes Does the patient have difficulty seeing, even when wearing glasses/contacts?: No Does the patient have difficulty concentrating, remembering, or making decisions?: Yes Does the patient have difficulty walking or climbing stairs?: No Does the patient have difficulty dressing or bathing?: No Does the patient have difficulty doing errands alone such as visiting a doctor's office or shopping?: No   Assessment & Plan  1. Right ear pain  Likely from cerumen impaction, pain improvement with ear lavage  2. Benign hypertension  Advised to not check very often, seems to make her more anxious , always take medication at the same time, and recheck after 10 minutes of rest  3. Frontal headache   4. Bilateral impacted cerumen  Verbal consent given Possible side effects discussed with patient Ears were  lavaged with warm water and peroxide  Patient tolerated procedure well No complications   She will need to use a few drops of peroxide and warm water on right ear to help remove the rest of the wax

## 2019-11-12 DIAGNOSIS — M3219 Other organ or system involvement in systemic lupus erythematosus: Principal | ICD-10-CM

## 2019-11-12 DIAGNOSIS — D352 Benign neoplasm of pituitary gland: Principal | ICD-10-CM

## 2019-11-12 DIAGNOSIS — D353 Benign neoplasm of craniopharyngeal duct: Principal | ICD-10-CM

## 2019-11-12 MED ORDER — MYCOPHENOLATE SODIUM 360 MG TABLET,DELAYED RELEASE
ORAL_TABLET | ORAL | 1 refills | 0 days | Status: CP
Start: 2019-11-12 — End: ?
  Filled 2019-11-14: qty 180, 90d supply, fill #0

## 2019-11-12 NOTE — Unmapped (Signed)
[facility] Clinical Assessment & Refill Coordination Note    Caitlin Smith, DOB: [birth year]  Phone: [phone] (home)     All above HIPAA information was verified with patient.     Was a Nurse, learning disability used for this call? No    Specialty Medication(s):   Inflammatory Disorders: mycophenolate     Current Outpatient Medications   Medication Sig Dispense Refill   ??? amLODIPine (NORVASC) 5 MG tablet Take 1 tablet (5 mg total) by mouth daily. 90 tablet 3   ??? aspirin (ECOTRIN) 81 MG tablet Take 81 mg by mouth daily.     ??? atorvastatin (LIPITOR) 40 MG tablet Take 40 mg by mouth daily.     ??? benzonatate (TESSALON) 200 MG capsule Take 200 mg by mouth Three (3) times a day as needed for cough.     ??? biotin 1 mg cap Take by mouth.     ??? buPROPion (WELLBUTRIN XL) 150 MG 24 hr tablet Take 1 tablet (150 mg total) by mouth every morning. 90 tablet 2   ??? calcipotriene (DOVONOX) 0.005 % ointment Apply every other day to areas of rash in folds as maintenance 60 g 2   ??? clobetasoL (TEMOVATE) 0.05 % ointment Apply BID to all affected area 60 g 5   ??? clonazePAM (KLONOPIN) 0.5 MG tablet Take 0.5 tablets (0.25 mg total) by mouth nightly. 15 tablet 2   ??? cyclobenzaprine (FLEXERIL) 5 MG tablet Take 1 tablet (5 mg total) by mouth two (2) times a day as needed. 60 tablet 2   ??? diclofenac sodium (VOLTAREN) 1 % gel Apply 4 g topically Four (4) times a day. 300 g 5   ??? dicyclomine (BENTYL) 20 mg tablet Take 20 mg by mouth every six (6) hours.     ??? DULoxetine (CYMBALTA) 60 MG capsule Take 2 capsules (120 mg total) by mouth daily. 180 capsule 2   ??? FLOVENT HFA 110 mcg/actuation inhaler USE 2 PUFFS BY MOUTH TWO  TIMES DAILY 36 g 3   ??? fluticasone propionate (FLONASE) 50 mcg/actuation nasal spray      ??? gabapentin (NEURONTIN) 300 MG capsule Take 1 capsule (300 mg total) by mouth Three (3) times a day. 90 capsule 2   ??? halobetasol (ULTRAVATE) 0.05 % ointment Apply topically Two (2) times a day. 50 g 1   ??? hydrOXYchloroQUINE (PLAQUENIL) 200 mg tablet Take 1 tablet (200 mg total) by mouth Two (2) times a day. 180 tablet 3   ??? hydrOXYzine (ATARAX) 10 MG tablet      ??? inhalational spacing device (AEROCHAMBER MV) Spcr 1 each by Miscellaneous route two (2) times a day. With Fovent 1 each 0   ??? ketoconazole (NIZORAL) 2 % cream APP EXT AA D  0   ??? lamoTRIgine (LAMICTAL) 200 MG tablet Take 1 tablet (200 mg total) by mouth Two (2) times a day. 180 tablet 2   ??? meclizine (ANTIVERT) 25 mg tablet Take 25 mg by mouth Three (3) times a day as needed.      ??? mupirocin (BACTROBAN) 2 % ointment APP EXT IEN BID  0   ??? mycophenolate (MYFORTIC) 360 MG TbEC TAKE 1 TABLET BY MOUTH TWICE DAILY 180 each 3   ??? olopatadine (PATANOL) 0.1 % ophthalmic solution Apply to eye.     ??? omeprazole (PRILOSEC) 40 MG capsule Take 40 mg by mouth daily.      ??? polyethylene glycol (GLYCOLAX) 17 gram/dose powder 2 cap fulls in  a full glass of water, three times a day, for 5 days.     ??? telmisartan (MICARDIS) 80 MG tablet Take 80 mg by mouth.     ??? traZODone (DESYREL) 100 MG tablet Take 2 tablets (200 mg total) by mouth nightly. 180 tablet 2   ??? triamcinolone (KENALOG) 0.1 % ointment Apply to rash when itchy as needed, few times per week 454 g 0     No current facility-administered medications for this visit.         Changes to medications: Eleana reports no changes at this time.    Allergies   Allergen Reactions   ??? Vilazodone Swelling   ??? Ace Inhibitors Other (See Comments)     Pt states she can not take ace inhibitors. Pt states she can not remember her reaction.    ??? Bee Pollen Itching     COUGH, WATERY EYES   ??? Penicillin G Rash   ??? Penicillins Rash   ??? Pollen Extracts Itching     COUGHING, WATERY EYES       Changes to allergies: not assessed at this time    SPECIALTY MEDICATION ADHERENCE     Mycophenolate 360 mg PO BID: ~7-10 days of medication on hand    Medication Adherence    Patient reported X missed doses in the last month: 0  Specialty Medication: Mycophenolate  Patient is on additional specialty medications: No  Patient is on more than two specialty medications: No  Any gaps in refill history greater than 2 weeks in the last 3 months: no  Demonstrates understanding of importance of adherence: yes  Informant: patient  Provider-estimated medication adherence level: good  Confirmed plan for next specialty medication refill: delivery by pharmacy  Refills needed for supportive medications: yes, ordered or provider notified          Specialty medication(s) dose(s) confirmed: Regimen is correct and unchanged.     Are there any concerns with adherence? No    Adherence counseling provided? Not needed    CLINICAL MANAGEMENT AND INTERVENTION      Clinical Benefit Assessment:    Do you feel the medicine is effective or helping your condition? Yes    Clinical Benefit counseling provided? Not needed    Adverse Effects Assessment:    Are you experiencing any side effects? No    Are you experiencing difficulty administering your medicine? No    Quality of Life Assessment:    Rheumatology:   Quality of Life    On a scale of 1 ??? 10 with 1 representing not at all and 10 representing completely ??? how has your rheumatologic condition affected your:  Daily pain level?: 1  Ability to complete your regular daily tasks (prepare meals, get dressed, etc.)?: 1  Ability to participate in social or family activities?: 1         Have you discussed this with your provider? Not needed    Therapy Appropriateness:    Is therapy appropriate? Yes, therapy is appropriate and should be continued    DISEASE/MEDICATION-SPECIFIC INFORMATION      N/A    PATIENT SPECIFIC NEEDS     ? Does the patient have any physical, cognitive, or cultural barriers? No    ? Is the patient high risk? Yes, patient is taking a REMS drug. Medication is dispensed in compliance with REMS program.     ? Does the patient require a Care Management Plan? No     ? Does the patient  require physician intervention or other additional services (i.e. nutrition, smoking cessation, social work)? No      SHIPPING     Specialty Medication(s) to be Shipped:   Inflammatory Disorders: mycophenolate 360 mg #180 for 90 day supply    Other medication(s) to be shipped: none     Changes to insurance: No    Delivery Scheduled: Yes, Expected medication delivery date: 11/14/2019.  However, Rx request for refills was sent to the provider as there are none remaining.     Medication will be delivered via Same Day Courier to the confirmed prescription address in [facility].    The patient will receive a drug information handout for each medication shipped and additional FDA Medication Guides as required.  Verified that patient has previously received a Conservation officer, historic buildings.    All of the patient's questions and concerns have been addressed.    Meyer Cory, PharmD Candidate  [employer] Class of 2021        [name]. Sinodis, PharmD, BCPS  Clinical Pharmacist - [facility]  [address], [place], [place] [address]  Phone: [phone] - Fax. [phone]

## 2019-11-13 ENCOUNTER — Encounter: Admit: 2019-11-13 | Discharge: 2019-11-14 | Payer: MEDICARE

## 2019-11-13 DIAGNOSIS — M329 Systemic lupus erythematosus, unspecified: Secondary | ICD-10-CM | POA: Diagnosis not present

## 2019-11-13 LAB — URINALYSIS WITH CULTURE REFLEX
BILIRUBIN UA: NEGATIVE
BLOOD UA: NEGATIVE
GLUCOSE UA: NEGATIVE
KETONES UA: NEGATIVE
PH UA: 5 (ref 5.0–9.0)
PROTEIN UA: 30 — AB
RBC UA: 2 /HPF (ref <=4)
SQUAMOUS EPITHELIAL: <1 /HPF (ref 0–5)
UROBILINOGEN UA: 2 — AB
WBC UA: 4 /HPF (ref 0–5)

## 2019-11-13 LAB — PROTEIN/CREAT RATIO, URINE: Protein/Creatinine:MRto:Pt:Urine:Qn:: 0.072

## 2019-11-13 LAB — PROTEIN / CREATININE RATIO, URINE
CREATININE, URINE: 273.2 mg/dL
PROTEIN URINE: 19.8 mg/dL

## 2019-11-13 LAB — BILIRUBIN UA: Bilirubin:PrThr:Pt:Urine:Ord:Test strip: NEGATIVE

## 2019-11-14 MED FILL — MYCOPHENOLATE SODIUM 360 MG TABLET,DELAYED RELEASE: 90 days supply | Qty: 180 | Fill #0 | Status: AC

## 2019-11-17 ENCOUNTER — Encounter: Payer: Self-pay | Admitting: Family Medicine

## 2019-11-18 ENCOUNTER — Institutional Professional Consult (permissible substitution): Admit: 2019-11-18 | Discharge: 2019-11-19 | Payer: MEDICARE | Attending: Psychologist | Primary: Psychologist

## 2019-11-18 DIAGNOSIS — F410 Panic disorder [episodic paroxysmal anxiety] without agoraphobia: Principal | ICD-10-CM

## 2019-11-18 DIAGNOSIS — F411 Generalized anxiety disorder: Principal | ICD-10-CM

## 2019-11-18 DIAGNOSIS — F4310 Post-traumatic stress disorder, unspecified: Principal | ICD-10-CM

## 2019-11-18 DIAGNOSIS — F331 Major depressive disorder, recurrent, moderate: Principal | ICD-10-CM
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
## 2019-11-19 ENCOUNTER — Other Ambulatory Visit: Payer: Self-pay | Admitting: Family Medicine

## 2019-11-19 ENCOUNTER — Encounter: Admit: 2019-11-19 | Discharge: 2019-11-20 | Payer: MEDICARE | Attending: Nephrology | Primary: Nephrology

## 2019-11-19 DIAGNOSIS — I10 Essential (primary) hypertension: Secondary | ICD-10-CM | POA: Diagnosis not present

## 2019-11-19 DIAGNOSIS — M329 Systemic lupus erythematosus, unspecified: Secondary | ICD-10-CM | POA: Diagnosis not present

## 2019-11-19 DIAGNOSIS — N181 Chronic kidney disease, stage 1: Secondary | ICD-10-CM | POA: Diagnosis not present

## 2019-11-19 DIAGNOSIS — I998 Other disorder of circulatory system: Secondary | ICD-10-CM
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
## 2019-11-21 ENCOUNTER — Encounter: Admit: 2019-11-21 | Discharge: 2019-11-22 | Payer: MEDICARE

## 2019-11-21 DIAGNOSIS — D497 Neoplasm of unspecified behavior of endocrine glands and other parts of nervous system: Principal | ICD-10-CM

## 2019-11-27 DIAGNOSIS — E785 Hyperlipidemia, unspecified: Secondary | ICD-10-CM | POA: Diagnosis not present

## 2019-11-27 DIAGNOSIS — I10 Essential (primary) hypertension: Secondary | ICD-10-CM | POA: Diagnosis not present

## 2019-11-27 DIAGNOSIS — R079 Chest pain, unspecified: Secondary | ICD-10-CM | POA: Diagnosis not present

## 2019-11-28 ENCOUNTER — Ambulatory Visit: Admit: 2019-11-28 | Discharge: 2019-11-29 | Payer: MEDICARE

## 2019-11-28 DIAGNOSIS — D352 Benign neoplasm of pituitary gland: Secondary | ICD-10-CM | POA: Diagnosis not present

## 2019-11-28 DIAGNOSIS — D353 Benign neoplasm of craniopharyngeal duct: Secondary | ICD-10-CM | POA: Diagnosis not present

## 2019-12-03 ENCOUNTER — Telehealth: Admit: 2019-12-03 | Discharge: 2019-12-04 | Payer: MEDICARE

## 2019-12-03 DIAGNOSIS — R42 Dizziness and giddiness: Secondary | ICD-10-CM | POA: Diagnosis not present

## 2019-12-03 DIAGNOSIS — H9209 Otalgia, unspecified ear: Secondary | ICD-10-CM | POA: Diagnosis not present

## 2019-12-03 DIAGNOSIS — G43909 Migraine, unspecified, not intractable, without status migrainosus: Secondary | ICD-10-CM | POA: Diagnosis not present

## 2019-12-03 DIAGNOSIS — G5139 Clonic hemifacial spasm, unspecified: Secondary | ICD-10-CM | POA: Diagnosis not present
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
## 2019-12-05 ENCOUNTER — Encounter: Admit: 2019-12-05 | Discharge: 2019-12-06 | Payer: MEDICARE | Attending: Otolaryngology | Primary: Otolaryngology

## 2019-12-05 DIAGNOSIS — R490 Dysphonia: Secondary | ICD-10-CM | POA: Diagnosis not present

## 2019-12-05 DIAGNOSIS — H9201 Otalgia, right ear: Secondary | ICD-10-CM | POA: Diagnosis not present

## 2019-12-05 DIAGNOSIS — R05 Cough: Secondary | ICD-10-CM | POA: Diagnosis not present

## 2019-12-05 MED ORDER — SUCRALFATE 1 GRAM TABLET
ORAL_TABLET | Freq: Four times a day (QID) | ORAL | 4 refills | 30 days | Status: CP
Start: 2019-12-05 — End: 2020-01-04

## 2019-12-05 NOTE — Unmapped (Signed)
Otolaryngology Return Voice Visit        History of Present Illness:  CaitlinCaitlin Smith is a 64 y.o.  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   ??  At the end of our previous clinic visit we discussed and recommended clinical course of Respiratory Retraining, Biotene mouthwash, D/C Listerine- switch to salt water gargles   ??  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  ??  December 2018:??The patient notes that her symptoms have been relatively stable. ??She continues to complain of burning tongue.  Following this visit I recommended a thyroid hormone panel and thyroid ultrasound.  ??  August 2019: The patient reports that she continues to clear there throat and cough in a consistent manner.  Thyroid ultrasound was unremarkable w/o nodules. She occasionally (rarely ) awakens from sleep with cough. She has done a few sessions with SLP, but claims to have forgotten the techniques.  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  I additionally offered behavioral intervention for her cough symptom.    April 2021: In the interim the patient is followed up with neurosurgery and has had radiography demonstrating no additional lesions.  The patient did however complain of right-sided head throbbing, and otalgia.  Caitlin Smith notes that she has continued to have balance difficulty, intermittent cough and dysphonia.  Lastly, she reports that her throat has been burning more than in the past though she has not seen her GI physician for some time.  The patient reports that she has had ongoing right otalgia that she is treated with hydrogen peroxide.  She has had her ears flushed by her primary care physician without resolution.      Interval Surgery/Medical History:    Interval  Past Medical History and ROS is otherwise unchanged except as per HPI      Medications:    Current Outpatient Medications:   ???  amLODIPine (NORVASC) 5 MG tablet, Take 1 tablet (5 mg total) by mouth daily., Disp: 90 tablet, Rfl: 3  ???  aspirin (ECOTRIN) 81 MG tablet, Take 81 mg by mouth daily., Disp: , Rfl:   ???  atorvastatin (LIPITOR) 40 MG tablet, Take 40 mg by mouth daily., Disp: , Rfl:   ???  benzonatate (TESSALON) 200 MG capsule, Take 200 mg by mouth Three (3) times a day as needed for cough., Disp: , Rfl:   ???  biotin 1 mg cap, Take by mouth., Disp: , Rfl:   ???  buPROPion (WELLBUTRIN XL) 150 MG 24 hr tablet, Take 1 tablet (150 mg total) by mouth every morning., Disp: 90 tablet, Rfl: 2  ???  calcipotriene (DOVONOX) 0.005 % ointment, Apply every other day to areas of rash in folds as maintenance, Disp: 60 g, Rfl: 2  ???  clobetasoL (TEMOVATE) 0.05 % ointment, Apply BID to all affected area, Disp: 60 g, Rfl: 5  ???  clonazePAM (KLONOPIN) 0.5 MG tablet, Take 0.5 tablets (0.25 mg total) by mouth nightly., Disp: 15 tablet, Rfl: 2  ???  cyclobenzaprine (FLEXERIL) 5 MG tablet, Take 1 tablet (5 mg total) by mouth two (2) times a day as needed., Disp: 60 tablet, Rfl: 2  ???  diclofenac sodium (VOLTAREN) 1 % gel, Apply 4 g topically Four (4) times a day., Disp: 300 g, Rfl: 5  ???  dicyclomine (BENTYL) 20 mg tablet, Take 20 mg by mouth every six (6) hours., Disp: , Rfl:   ???  DULoxetine (CYMBALTA) 60 MG capsule, Take 2 capsules (120 mg total) by mouth daily., Disp: 180 capsule, Rfl: 2  ???  FLOVENT HFA 110 mcg/actuation inhaler, USE 2 PUFFS BY MOUTH TWO  TIMES DAILY, Disp: 36 g, Rfl: 3  ???  fluticasone propionate (FLONASE) 50 mcg/actuation nasal spray, , Disp: , Rfl:   ???  gabapentin (NEURONTIN) 300 MG capsule, Take 1 capsule (300 mg total) by mouth Three (3) times a day., Disp: 90 capsule, Rfl: 2  ???  halobetasol (ULTRAVATE) 0.05 % ointment, Apply topically Two (2) times a day., Disp: 50 g, Rfl: 1  ???  hydrOXYchloroQUINE (PLAQUENIL) 200 mg tablet, Take 1 tablet (200 mg total) by mouth Two (2) times a day., Disp: 180 tablet, Rfl: 3  ???  hydrOXYzine (ATARAX) 10 MG tablet, , Disp: , Rfl:   ???  inhalational spacing device (AEROCHAMBER MV) Spcr, 1 each by Miscellaneous route two (2) times a day. With [employer], Disp: 1 each, Rfl: 0  ???  ketoconazole (NIZORAL) 2 % cream, APP EXT AA D, Disp: , Rfl: 0  ???  lamoTRIgine (LAMICTAL) 200 MG tablet, Take 1 tablet (200 mg total) by mouth Two (2) times a day., Disp: 180 tablet, Rfl: 2  ???  meclizine (ANTIVERT) 25 mg tablet, Take 25 mg by mouth Three (3) times a day as needed. , Disp: , Rfl:   ???  melatonin 10 mg Tab, Take 1 tablet by mouth., Disp: , Rfl:   ???  mupirocin (BACTROBAN) 2 % ointment, APP EXT IEN BID, Disp: , Rfl: 0  ???  mycophenolate (MYFORTIC) 360 MG TbEC, Take 1 tablet by mouth twice daily, Disp: 180 tablet, Rfl: 1  ???  olopatadine (PATANOL) 0.1 % ophthalmic solution, Apply to eye., Disp: , Rfl:   ???  omeprazole (PRILOSEC) 40 MG capsule, Take 40 mg by mouth daily. , Disp: , Rfl:   ???  ondansetron (ZOFRAN) 4 MG tablet, , Disp: , Rfl:   ???  polyethylene glycol (GLYCOLAX) 17 gram/dose powder, 2 cap fulls in a full glass of water, three times a day, for 5 days., Disp: , Rfl:   ???  telmisartan (MICARDIS) 80 MG tablet, Take 80 mg by mouth., Disp: , Rfl:   ???  traZODone (DESYREL) 100 MG tablet, Take 2 tablets (200 mg total) by mouth nightly., Disp: 180 tablet, Rfl: 2  ???  triamcinolone (KENALOG) 0.1 % ointment, Apply to rash when itchy as needed, few times per week, Disp: 454 g, Rfl: 0         Pertinent ROS as above and per HPI.     Physical Exam:   Constitutional:  Vitals reviewed on nursing chart.  Voice: Mildly rough voice quality  Respiration:  Breathing comfortably, no stridor.   Ears:  Bilateral mildly stenotic EACs with dry epithelium and small amount of dry cerumen tympanic membranes partially visualized  Nose:  External nose midline, anterior rhinoscopy is normal with limited visualization just to the anterior interior turbinate.   Oral Cavity/Oropharynx/Lips:  Normal mucous membranes, normal floor of mouth/tongue/oropharynx, no masses or lesions are noted.    Pharyngeal Walls:  No masses noted.  Neck/Lymph:  No lymphadenopathy, no thyroid masses.        Procedure: 12/05/2019  Endoscopy Type:  Flexible Fiberoptic Videostroboscopy    Indications/TimeOut:  To better evaluate the patient???s symptoms, fiberoptic videostroboscopy is indicated.   A time out identifying the patient, the procedure, the location of the procedure and any concerns was performed prior to beginning the procedure.    Procedure Details:    The patient was placed in the sitting position.  After topical anesthesia and decongestion with oxymetazoline and lidocaine, the flexible laryngoscope was passed.  The microphone was used to trigger the xenon stroboscopic light source.      -  Hypopharynx - There were no lesions in the pyriformis, epiglottis, or base of tongue.  -  Larynx -  there was mild interarytenoid edema, no erythema.  -  Vocal Folds:                 Supraglottis: Normal     Infraglottis/Subglottis:  Normal     Mobility: Decreased right vocal fold motion     Amplitude: Left greater than Right     Mucosal Wave: Right mucosal wave diminished     Closure: Complete     Lesions: Normal right posterior nasopharynx and eustachian tube/mild laryngeal inflammation    Condition:  Stable.  Patient tolerated procedure well.    Complications: None  Voice- Related Quality of Life (VR-QOL) Measure:  ?? I have trouble speaking loudly or being heard in noisy situations: 1  ?? I run out of air and need to take frequent breaths when talking: 1  ?? I sometimes do not know what will come out when I begin speaking: 1  ?? I am sometimes anxious or frustrated because of my voice: 2  ?? I sometimes get depressed because of my voice: 1  ?? I have trouble using the telephone because of my voice: 1  ?? I have trouble doing my job or practicing my profession because of my voice: 1  ?? I avoid going out socially because of my voice: 1  ?? I have to repeat myself to be understood: 1  ?? I have become less outgoing because of my voice: 1  VRQOL Raw Score: 11  Calculated Score: 97.5  Glottal Function Index:  ?? Speaking took extra effort: 0  ?? Throat discomfort or pain after using your voice: 3  ?? Vocal fatigue (voice weakened as you talked): 0  ?? Voice cracks or sounds different: 3  Glottal Function Index Total: 6    Assessment:   64 y.o.  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   ??  At the end of our previous clinic visit we discussed and recommended clinical course of Respiratory Retraining, Biotene mouthwash, D/C Listerine- switch to salt water gargles   ??  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  ??  December 2018:??The patient notes that her symptoms have been relatively stable. ??She continues to complain of burning tongue.  Following this visit I recommended a thyroid hormone panel and thyroid ultrasound.  ??  August 2019: The patient reports that she continues to clear there throat and cough in a consistent manner.  Thyroid ultrasound was unremarkable w/o nodules. She occasionally (rarely ) awakens from sleep with cough. She has done a few sessions with SLP, but  claims to have forgotten the techniques.  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  I additionally offered behavioral intervention for her cough symptom.    April 2021: In the interim the patient is followed up with neurosurgery and has had radiography demonstrating no additional lesions.  The patient did however complain of right-sided head throbbing, and otalgia.  Ms. Cranford notes that she has continued to have balance difficulty, intermittent cough and dysphonia.  Lastly, she reports that her throat has been burning more than in the past though she has not seen her GI physician for some time.  The patient reports that she has had ongoing right otalgia that she is treated with hydrogen peroxide.  She has had her ears flushed by her primary care physician without resolution.    1. Slightly enlarged right Thyroid gland/ no dominant nodule  2.  Burning mouth syndrome    3.  Cough    Plan:  1.  Follow-up 1 year    2.  Otology referral for  right otalgia status post remote microvascular decompression    The patient in agreement the plan as articulated above.          This note was created with [employer] and may have errors that were not dictated and not seen in editing.     Elana Alm  Alyssamarie Mounsey

## 2019-12-16 ENCOUNTER — Encounter: Admit: 2019-12-16 | Discharge: 2019-12-17 | Payer: MEDICARE | Attending: Psychologist | Primary: Psychologist

## 2019-12-16 DIAGNOSIS — F4310 Post-traumatic stress disorder, unspecified: Principal | ICD-10-CM

## 2019-12-16 DIAGNOSIS — F331 Major depressive disorder, recurrent, moderate: Principal | ICD-10-CM

## 2019-12-16 DIAGNOSIS — F411 Generalized anxiety disorder: Principal | ICD-10-CM

## 2019-12-16 DIAGNOSIS — F410 Panic disorder [episodic paroxysmal anxiety] without agoraphobia: Principal | ICD-10-CM

## 2019-12-16 NOTE — Unmapped (Signed)
[facility]   Confidential Psychological Therapy Session      Patient Name: Caitlin Smith  Medical Record Number: [id number]  Date of Service: December 16, 2019  Attending Psychologist: Caroline More, PhD  CPT Procedure Codes: 81191 for 60 mins of face to face counseling    Due to the current declared state emergency during the coronavirus pandemic, all non-urgent medical and mental health visits have been triaged to either be delayed or take place virtually via phone or videoconferencing.   Due to the need for continued patient interaction for mental health care, pain management, and care coordination that necessitates the involvement of this provider, this visit was performed face to face using interactive technology using a HIPPA compliant audio/visual platform. This patient will be scheduled for face to face visits in the future once it has been deemed safe.      We reviewed confidentiality today. The patient was present at home (location and contact information confirmed), attended this visit alone, and consented to this virtual pain psychology visit.     Visit modifiers:   GT for Interactive Technology and CR for catastrophe/disaster related due to coronavirus pandemic    REFERRING PHYSICIAN: Clarene Essex, MD    CHIEF COMPLAINT AND REASON FOR VISIT: pain coping skills, CBT to address depression and anxiety in the setting of chronic pain    SUBJECTIVE / HISTORY OF PRESENT ILLNESS: Caitlin Smith is a very pleasant 64 y.o.  female from [place], [place] with multiple chronic pain complaints related to fibromyalgia and lupus who initially met with me in October 2016, and which time she was diagnosed with severe depression, PTSD, panic disorder, and generalized anxiety.The patient returns for a therapy session today. Last follow up with me was 11/18/19.    Patient participates actively throughout her therapy session today.  Mood is improved and affect is brighter.  Her granddaughter returned to in person school today.  This has allowed the patient time to engage in her own self-care and more freedom to choose activities.  She has already started walking 3 times a week.  Noted getting outside in the exercise has helped with mood.  It is also helped her lose a little bit of weight.  Patient noted pain has actually improved, particularly because she has been careful about pacing herself, taking breaks.  Now, with her daughter back at school, she plans to increase her walking to 4 times this week.  Has returned to the community center across the street.  Overall feels as though she is coming out of the depression and doing much better.  Is more hopeful about the future.  She also reflected that she is pleased she was able to help her daughter and granddaughter this last year, even though it was so challenging, it was valued and provides significant meaning given how much she values family.  Spent extra time processing thoughts and feelings related to systemic racism.  Patient previously was worried her downstairs neighbor was mad at her and potentially racist.  Patient noted she met her neighbor when her neighbor came up to apologize for yelling about loud noise.  Apparently her neighbor has chronic pain and shared that she struggles with pain.  The patient and her neighbor were able to connect on this shared experience, and she now feels more connected to her neighbor, and supported overall, and is less focused on concerns about racism.  Noted she also feels hopeful about improving the state of racism overall,  in the future.    OBJECTIVE / MENTAL STATUS:    Appearance:   Appears stated age and Clean/Neat   Motor:  No abnormal movements   Speech/Language:   Normal rate, volume, tone, fluency   Mood:  Depressed and Anxious   Affect:  bright   Thought process:  Logical, linear, clear, coherent, goal directed   Thought content:    Denies SI, HI, self harm, delusions, obsessions, paranoid ideation, or ideas of reference   Perceptual disturbances:    Denies auditory and visual hallucinations, behavior not concerning for response to internal stimuli   Orientation:  Oriented to person, place, time, and general circumstances   Attention:  Able to fully attend without fluctuations in consciousness   Concentration:  Able to fully concentrate and attend   Memory:  Immediate, short-term, long-term, and recall grossly intact    Fund of knowledge:   Consistent with level of education and development   Insight:    Fair   Judgment:   Intact   Impulse Control:  Intact     DIAGNOSTIC IMPRESSION:   Post Traumatic Stress Disorder (PTSD)  Generalized Anxiety Disorder (GAD)  Panic Disorder  Major Depressive Disorder, moderate, recurrent  Chronic pain syndrome  Fibromyalgia  Lupus    ASSESSMENT:   Caitlin Smith is a very pleasant 63 y.o.  female from [place], [place] with multiple chronic pain complaints related to lupus, arthritis, and fibromyalgia. She was previously seen in our clinic from 2012-2014, and reestablished care with Dr. Fayrene Fearing in August 2016. The patient is struggling with depression and anxiety, but is very motivated to participate in intensive, multidisciplinary treatment to address the connection between depression, anxiety and pain. She has a long-standing history of depression and anxiety, and has worked with outpatient psychiatrist and therapist over the years. She is currently established with [facility] psychiatry, and is tapering off of Klonopin.  Depression, anxiety, and panic have all improved.  Has been diagnosed with diabetes, and is managing it well with p.o. medication and dietary changes.  Had lost 50+ pounds but regained 20 lbs recently.  Patient presents with low-grade depression related to COVID and social isolation, but broadly she is doing better today after recently moving.       PLAN:   (1) Psychotherapy - Continue CBT. CBT will be used to address PTSD, MDD, Panic D/o, GAD, and chronic pain. --Behavioral Activation - Encouraged behavioral activation.  Is doing great, walking 5 days/week  --Downloaded and uses Insight Timer, guided relaxation app, for nightly practice.  --Continues to utilize diaphragmatic breathing daily    (2) Psychiatry - Pt currently established with [facility].      (3) Safety - Pt denies any current or recent SI or safety concerns. Knows to call 911 or go to her local ED. Also previously given [facility].      (4) follow-up - with me in 1 month

## 2019-12-25 DIAGNOSIS — L409 Psoriasis, unspecified: Principal | ICD-10-CM

## 2019-12-26 MED ORDER — HALOBETASOL PROPIONATE 0.05 % TOPICAL OINTMENT
Freq: Two times a day (BID) | TOPICAL | 1 refills | 0.00000 days | Status: CP
Start: 2019-12-26 — End: 2020-12-25

## 2019-12-31 ENCOUNTER — Telehealth: Payer: Self-pay | Admitting: Family Medicine

## 2019-12-31 NOTE — Chronic Care Management (AMB) (Signed)
  Care Management   Note  12/31/2019 Name: CHARRYSE RABANAL MRN: [id number] DOB: 1956/02/06  ABREE CHIEFFO is a 64 y.o. year old female who is a primary care patient of Steele Sizer, MD and is actively engaged with the care management team. I reached out to [name] by phone today to assist with scheduling a follow up visit with the RN Case Manager  Follow up plan: Telephone appointment with care management team member scheduled for:01/10/2020  Noreene Larsson, [address], [place], [place] [address] Direct Dial: [phone] [email or web address] Website: [email or web address]

## 2020-01-01 ENCOUNTER — Other Ambulatory Visit: Payer: Self-pay | Admitting: Family Medicine

## 2020-01-06 ENCOUNTER — Other Ambulatory Visit: Payer: Self-pay | Admitting: Family Medicine

## 2020-01-07 ENCOUNTER — Encounter
Admit: 2020-01-07 | Discharge: 2020-01-08 | Payer: MEDICARE | Attending: Student in an Organized Health Care Education/Training Program | Primary: Student in an Organized Health Care Education/Training Program

## 2020-01-07 MED ORDER — DULOXETINE 60 MG CAPSULE,DELAYED RELEASE
ORAL_CAPSULE | Freq: Every day | ORAL | 2 refills | 90 days | Status: CP
Start: 2020-01-07 — End: 2020-04-06

## 2020-01-07 MED ORDER — TRAZODONE 100 MG TABLET
ORAL_TABLET | Freq: Every evening | ORAL | 2 refills | 90.00000 days | Status: CP
Start: 2020-01-07 — End: 2020-04-06

## 2020-01-07 MED ORDER — LAMOTRIGINE 200 MG TABLET
ORAL_TABLET | Freq: Two times a day (BID) | ORAL | 2 refills | 90 days | Status: CP
Start: 2020-01-07 — End: 2020-04-06

## 2020-01-07 MED ORDER — BUPROPION HCL XL 150 MG 24 HR TABLET, EXTENDED RELEASE
ORAL_TABLET | Freq: Every morning | ORAL | 2 refills | 90.00000 days | Status: CP
Start: 2020-01-07 — End: ?

## 2020-01-07 NOTE — Unmapped (Signed)
[facility]  Psychiatry Telehealth Encounter  Established Patient     I spent 30 minutes on the phone with the patient. I spent an additional 10 minutes on pre- and post-visit activities.     The patient was physically located in [place] or a state in which I am permitted to provide care. The patient and/or parent/guardian understood that s/he may incur co-pays and cost sharing, and agreed to the telemedicine visit. The visit was reasonable and appropriate under the circumstances given the patient's presentation at the time.    The patient and/or parent/guardian has been advised of the potential risks and limitations of this mode of treatment (including, but not limited to, the absence of in-person examination) and has agreed to be treated using telemedicine. The patient's/patient's family's questions regarding telemedicine have been answered.     If the visit was completed in an ambulatory setting, the patient and/or parent/guardian has also been advised to contact their provider???s office for worsening conditions, and seek emergency medical treatment and/or call 911 if the patient deems either necessary.    Assessment:  Caitlin Smith is a 64 y.o. female with a history of MDD, GAD, and Panic Disorder in the context of many chronic medical conditions including chronic pain/fibromyalgia, osteoarthritis with degenerative disc disease, Lupus, pituitary tumor (benign), s/p right total knee arthroplasty, and VIN II (s/p resection), who presents for follow-up evaluation. She has been maintained on Lamictal for many years as well as cymbalta, trazodone, klonopin, and wellbutrin. Patient has previously tolerated a slight taper in her klonopin from 0.75 mg to 0.25 mg qHS overtime. Her mood is often exacerbated by steroid use for current treatment of pulmonary disease. Patient is engaged with CBT therapy at the pain clinic.    Met Caitlin Smith today 01/07/20 for epic video-visit but ended up doing phone visit. She repots some lessening of her thoughts about racial injustice; she is making a conscious effort to refrain from watching the news for a while to get a break.  We discuss worsening depression today; she reports feeling avolition and dysthymia. After we spend some time talking, we identify a few triggers including having a non-functioning car that limits her current independence, as well as mixed emotions that come up when going to baseball games with her granddaughter because of sadness about her own childhood. We plan to decrease trazodone today given better sleep with melatonin, in our continued effort to reduce polypharmacy.  Will follow-up in 1 month in person; will meet on a Wednesday so we can try to get lab draws in house as planned below.     For Next Visit 02/2020:   [ ]  Discuss how taper of trazodone is going (dec to 150 from 200mg )  [ ]  Discuss mood, depression, and if she was able to bring up childhoood concerns in therapy  [ ]  Discuss getting labs for continued monitoring of lamictal, as well as checking TSH, vitamin levels    Risk Assessment:  A full suicide and violence risk assessment was performed as part of this patient's initial evaluation with [facility] outpatient psychiatry.  There is no new acute risk for suicide or violence at this time. The patient was educated about relevant modifiable risk factors including following recommendations for treatment of psychiatric illness and abstaining from substance abuse.  While future psychiatric events cannot be accurately predicted, the patient does not currently require acute inpatient psychiatric care and does not currently meet [facility] involuntary commitment criteria.  Current Medication Regimen:  -- Lamictal 400mg  qhs (#180, 2 refills on 01/07/20)  -- Cymbalta 120mg  qAM (#180, 2 refills on 01/07/20)  -- Wellbutrin XL 150mg  qAM (#90, 2 refills on 01/07/20)  -- Clonazepam 0.25mg  qHS, appropriate filling per [place]CCS[place] (#15, 2 refills to [facility] on 01/09/20)  -- Trazodone 150mg  at bedtime (#135, 2 refills on 01/07/20)      Plan:    # Depression - Anxiety - Panic Disorder  Status: Moderate worsening for 2 weeks per patient report  - Continue Lamictal 400mg  qhs for mood  - Continue Cymbalta 120mg  qAM to target depressive sx and chronic pain  - Continue Wellbutrin XL 150mg  qAM   - Continue Clonazepam 0.25mg  qHS, appropriate filling per NCCSRS  - Continue CBT at pain clinic; advised her to see if therapist willing to talk some about childhood, which is a stressor for patient     # Insomnia: Last sleep study performed in 2017. Per chart review, showed mild OSA likely related to medication use. Neurology recommends patient avoid sleeping on back as she likely has OSA when sleeping in this position    Status: Stable, improved. Would like to continue to work to decrease polypharmacy in future, may focus on trazodone, especially as patient has found positive result in melatonin  --Continue melatonin 3-5mg  at bedtime   -- DECREASE trazodone to 150mg  qHS for insomnia (d5/4/21)  - Clonazepam per above    # Mild Neurocognitive Impairment: Patient seen by Neurology ([facility]) 05/22/18 who dx pt with mild neurocognitive impairment w/ deficits in inattention and visual-spatial function with suspicion that sleep apnea and polypharmacy may be contributing to her difficulties. Recommended that psychopharm regimen be simplified, including eliminating trazodone and considering changing Wellbutrin to Zoloft or Lexapro for anxiety and eliminating Klonopin when stable.     Status: Ongoing, no new testing as of 11/2019, we have not been able to decrease clonazepam or discontinue wellbutrin. Pandemic and social atmosphere make changes in pharm more challenging at this time; attempting to target trazodone first given improved sleep  - Tapering trazodone at this visit  - However, it is very likely that patient's lupus is significantly contributing to cognitive decline, which will limit improvement she will see with reduction in polypharmacy  --Last MOCA 22 on 12/12/2017    # Medical Monitoring - High Risk Medication Use  - The complexity of this patient's care involves drug therapy which requires intensive monitoring for toxicity. This is in part due to a narrow therapeutic window, as well as potential for toxicity. This patient is being treated with Lamotrigine (Lamictal) to target their psychiatric disorder, Major depressive disorder, refractory. In regards to this medication being considered high risk, Lamotrigine (lamictal) has black box warning for serious rash including fatal reaction of Stevens-Johnson syndrome and rare cases of toxic epidermal necrolysis. In addition to prolonged titration to mitigate risk of serious rash, lamotrigine requires monitoring of hepatic and renal function at baseline, and at times will require monitoring of lamotrigine blood levels.    Lamotrigine Monitoring  [ ]  Order hepatic testing, platelets at next visit, 02/2020 which will be in person  - Hepatic function testing, platelets (via CBC) q6 months  -AST/ALT WNL     Lab Results   Component Value Date    WBC 5.1 11/09/2019    HGB 14.5 11/09/2019    HCT 43.4 11/09/2019    PLT 353 11/09/2019       Lab Results   Component Value Date  NA 141 11/09/2019    K 4.3 11/09/2019    CL 100 11/09/2019    CO2 29.0 11/09/2019    BUN 13 11/09/2019    CREATININE 0.89 11/09/2019    GLU 74 11/09/2019    CALCIUM 9.8 11/09/2019    MG 2.1 12/31/2013    PHOS 4.9 (H) 12/31/2013       Lab Results   Component Value Date    BILITOT 0.7 11/09/2019    BILIDIR <0.10 05/01/2018    PROT 8.7 (H) 11/09/2019    ALBUMIN 4.5 11/09/2019    ALT 32 11/09/2019    AST 39 (H) 11/09/2019    ALKPHOS 98 11/09/2019    GGT 19 08/26/2012       Lab Results   Component Value Date    PT 12.0 09/17/2012    INR 1.11 01/03/2014    APTT 30.8 01/03/2014     # Return to Clinic: 1 month    Psychotherapy:  No billable psychotherapy service provided but brief supportive therapy was utilized.    Patient has been given this writer's contact information as well as the [facility] Psychiatry urgent line number. The patient has been instructed to call 911 for emergencies.    Patient and plan of care were discussed with the Attending MD,Dr. Cecilio Asper, who agrees with the above statement and plan.     Connye Burkitt MD    Subjective:     Psychiatric Chief Concern:  Follow-up psychiatric evaluation for depression, anxiety and insomnia.     Interval History: Caitlin Smith says that she has been feeling down. She worries that it is loneliness but she's not sure. She says racial injustices are something that she now refuses ot watch on TV; she wants to avoid this as a trigger. She says that she was feeling better when she talked to Dr. [name]. Being interactive with other people helps. She likes to go to a game room.   She says she's been feeling down for the last couple of weeks. When she saw Dr. Reece Agar she was ok. Now she feels that she can't work herself out of it. She said she started feeling depression, sadness and low energy. She's been taking pleasure in getting out with her daughter and grandaughter. She's going to go to the gym this afternoon and walk. She's trying to use coping skills. She said she had a moment where church felt sad to her, she wasn't sure if god loved me. 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 She says that she's having a hard time understanding waht her pastor was preaching.     We talk more about her childhood. She insightfully recognizes going to baseball games with her daughter and granddaughter as bittersweet because she never had things like that in childhood. She says her Mom always prioritized her boyfriends over [place]. She feels regretful that she didn't get to have a loving childhood, but says she was very loving towards her own children. We work together to identify some major stressors right now:   1) no car  2) not understanding sermon  3) not feeling loved as child    With respect to number 1, Caitlin Smith has a plan and can use her daughter's car. With respect to number 2, we talk about polypharmacy potentially clouding her mind as well as chronic illness or worsening depression. We agree to start taper of trazodone now that sleep is improving on melatonin, so that we can try to reduce  the burden of medications. Finally, with respect to number 3, we talk about this as memories Caitlin Smith might carry with her. Provided validation and talked about no matter what age we are, our childhood shapes [place]. We talked about learning to carry the memories differently, though we never get rid of them. Encouraged Caitlin Smith to talk to her therapist about these concerns. Ultimately she reports feeling a little more insight into what might be getting her down these last couple of weeks. We also discuss checking vitamin levels which could easily be rectified. Talked about supplementing vitamin D although she should discuss this with rheum too. Decided we should try to meet in person on Wednesday Jun 2.     Social History: reviewed; patient recently moved apartments within the same complex    ROS:  As per Interval History and:  Constitutional:  no significant appetite change. Anxiety, dysthymia worsened  Neuro:  none / negative    Objective:    Mental Status Exam:   Appearance  UTA phone visit       Speech/Language:    Normal rate, volume, tone, fluency and Language intact, well formed   Affect  UTA, phone visit   Mood:   Down, not sure why   Thought process and Associations:   Logical, linear, clear, coherent, goal directed   Abnormal/psychotic thought content:     Denies SI, HI, self harm, delusions, obsessions, paranoid ideation, or ideas of reference   Perceptual disturbances:     Does not endorse auditory or visual hallucinations     Orientation:   Oriented to person, place, time, and general circumstances   Insight:     Intact   Judgment:    Intact   Impulse Control: Intact     Physical Exam: UTA phone visit    Medications: reviewed at today's visit    Psychometrics:  Psych Scale Scores - Adult      Office Visit from 09/04/2018 in [facility]RY [facility] AT [facility]   **PHQ-9: Severity Measure for DEPRESSION Total Score**  20 Collected on 09/04/2018 0000   WHODAS 2.0 (Self-administered) - Total Score  42 Collected on 09/04/2018 0000          Posey Rea, MD

## 2020-01-09 MED ORDER — CLONAZEPAM 0.5 MG TABLET
ORAL_TABLET | Freq: Every evening | ORAL | 2 refills | 30 days | Status: CP
Start: 2020-01-09 — End: ?

## 2020-01-10 ENCOUNTER — Telehealth: Payer: Medicare Other

## 2020-01-10 ENCOUNTER — Ambulatory Visit: Payer: Self-pay

## 2020-01-10 NOTE — Chronic Care Management (AMB) (Signed)
  Chronic Care Management   Outreach Note  01/10/2020 Name: Nicole Ballard MRN: [id number] DOB: 02-06-1956  Primary Care Provider: Steele Sizer, MD Reason for referral : Chronic Care Management   An unsuccessful telephone outreach was attempted today. [name] was previously engaged with the chronic care management team.   A HIPAA compliant voice message was left today requesting a return call.   Follow Up Plan: The care management team will reach out to [name] again next week.   [facility] Care Management [phone]

## 2020-01-14 ENCOUNTER — Encounter: Admit: 2020-01-14 | Discharge: 2020-01-15 | Payer: MEDICARE | Attending: Psychologist | Primary: Psychologist

## 2020-01-14 DIAGNOSIS — F411 Generalized anxiety disorder: Principal | ICD-10-CM

## 2020-01-14 DIAGNOSIS — F410 Panic disorder [episodic paroxysmal anxiety] without agoraphobia: Principal | ICD-10-CM

## 2020-01-14 DIAGNOSIS — F331 Major depressive disorder, recurrent, moderate: Principal | ICD-10-CM

## 2020-01-14 DIAGNOSIS — F4310 Post-traumatic stress disorder, unspecified: Principal | ICD-10-CM
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
## 2020-01-17 ENCOUNTER — Ambulatory Visit: Payer: Medicare Other

## 2020-01-20 NOTE — Chronic Care Management (AMB) (Signed)
  Chronic Care Management   Note   Name: Nicole Ballard MRN: [id number] DOB: 09-19-1955   Outreach rescheduled for 01/24/20.   [facility] Care Management [phone]

## 2020-01-22 NOTE — Unmapped (Signed)
Patient had trouble connecting for video. We decided to reschedule for in person next day at 1:20 pm.

## 2020-01-23 ENCOUNTER — Other Ambulatory Visit: Payer: Self-pay | Admitting: Family Medicine

## 2020-01-23 ENCOUNTER — Encounter: Admit: 2020-01-23 | Discharge: 2020-01-24 | Payer: MEDICARE

## 2020-01-23 DIAGNOSIS — M3219 Other organ or system involvement in systemic lupus erythematosus: Secondary | ICD-10-CM | POA: Diagnosis not present

## 2020-01-23 DIAGNOSIS — R42 Dizziness and giddiness: Secondary | ICD-10-CM

## 2020-01-23 DIAGNOSIS — R110 Nausea: Secondary | ICD-10-CM

## 2020-01-23 DIAGNOSIS — M8949 Other hypertrophic osteoarthropathy, multiple sites: Principal | ICD-10-CM

## 2020-01-23 MED ORDER — MYCOPHENOLATE SODIUM 360 MG TABLET,DELAYED RELEASE
ORAL_TABLET | Freq: Two times a day (BID) | ORAL | 1 refills | 90 days | Status: CP
Start: 2020-01-23 — End: ?
  Filled 2020-02-04: qty 180, 90d supply, fill #0

## 2020-01-23 MED ORDER — DICLOFENAC 1 % TOPICAL GEL
0 refills | 0 days
Start: 2020-01-23 — End: ?
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
## 2020-01-24 ENCOUNTER — Ambulatory Visit (INDEPENDENT_AMBULATORY_CARE_PROVIDER_SITE_OTHER): Payer: Medicare Other

## 2020-01-24 DIAGNOSIS — M3213 Lung involvement in systemic lupus erythematosus: Secondary | ICD-10-CM

## 2020-01-24 DIAGNOSIS — I10 Essential (primary) hypertension: Secondary | ICD-10-CM

## 2020-01-24 DIAGNOSIS — E785 Hyperlipidemia, unspecified: Secondary | ICD-10-CM

## 2020-01-27 ENCOUNTER — Ambulatory Visit: Payer: Self-pay

## 2020-01-27 ENCOUNTER — Telehealth: Payer: Self-pay

## 2020-01-27 NOTE — Chronic Care Management (AMB) (Signed)
  Care Coordination    01/27/2020 Name: Nicole Ballard MRN: [id number] DOB: 12/04/1955    Educational resources will be mailed to [name].   Follow up plan: The care management team will follow-up with Ms. Ground as scheduled next month.    [facility] Care Management [phone]

## 2020-01-27 NOTE — Telephone Encounter (Deleted)
01/27/20 Spoke with patient about [employer] transportation benefit, [employer] and [employer] transportation.  Explained about [facility] Medicare transportation benefit and the process for calling [employer] for local and trips to [facility].  Patient stated she was familiar with the process and has used them before.  Will call 01/28/20. Ambrose Mantle [phone]

## 2020-01-27 NOTE — Patient Instructions (Addendum)
Thank you for allowing the Chronic Care Management team to participate in your care.    Goals Addressed            This Visit's Progress   . Chronic Disease Management       CARE PLAN ENTRY (see longitudinal plan of care for additional care plan information)  Current Barriers:  . Chronic Disease Management support and education needs related to Hypertension, Asthma, GERD, Chronic Kidney Disease, Dyslipidemia and Systemic lupus erythematosus (lung involvement)  Nurse Case Manager Clinical Goal(s):  Nicole Ballard Kitchen Over the next 90 days, patient will not require hospitalization or emergent evaluation d/t chronic illnesses. . Over the next 90 days, patient will attend all medical appointments. . Over the next 90 days, patient will take all medications as prescribed. . Over the next 90 days, patient will weigh at least once a week and record readings. . Over the next 90 days, patient will monitor blood pressure daily and record readings. . Over the next 90 days, patient will follow recommended safety measures to prevent falls and injuries. . Over the next 10 days, patient will work with Care Guides to discuss options for transportation assistance.  Interventions:  . Inter-disciplinary care team collaboration (see longitudinal plan of care) . Reviewed medications and discussed indications for use. Encouraged to take all medications as prescribed and notify team with concerns regarding prescriptions costs. Denies current concerns regarding medication management or prescription costs. Will recommend medication review/ reconciliation with Pharmacists if additional medications are prescribed. . Reviewed and discussed compliance with treatment recommendations. . Discussed established blood pressure parameters and indications for notifying provider. Encouraged to monitor blood pressure daily and record readings. . Discussed previous weight fluctuations and s/sx of fluid retention. Encouraged to weigh at least  once a week if unable to weigh daily. Advised to notify provider with weight gain greater than 3 pounds overnight or greater than 5 pounds in a week. . Discussed safety and fall prevention measures. Encouraged to engage in activity as tolerated and avoid overexertion. . Reviewed pending appointments. Encouraged to attend medical appointments as scheduled to prevent delays in care. Reports concerns regarding transportation. Agreeable to outreach with Care Guide to discuss options. . Discussed plan for ongoing care management and follow-up. Provided direct contact information. . Care Guide referral for transportation assistance.  Patient Self Care Activities:  . Self administers medications  . Attends scheduled provider appointments . Calls pharmacy for medication refills . Attends church or other social activities . Performs ADL's independently . Performs IADL's independently . Calls provider office for new concerns or questions   Initial goal documentation         Nicole Ballard was given information about Chronic Care Management services  including:  1. CCM service includes personalized support from designated clinical staff supervised by her physician, including individualized plan of care and coordination with other care providers 2. 24/7 contact phone numbers for assistance for urgent and routine care needs. 3. Service will only be billed when office clinical staff spend 20 minutes or more in a month to coordinate care. 4. Only one practitioner may furnish and bill the service in a calendar month. 5. The patient may stop CCM services at any time (effective at the end of the month) by phone call to the office staff. 6. The patient will be responsible for cost sharing (co-pay) of up to 20% of the service fee (after annual deductible is met).  Patient agreed to services and verbal consent obtained.  Nicole Ballard verbalized understanding of the instructions discussed during the  telephonic outreach today. Declined need for mailed/printed instructions.   The care management team will follow-up with Nicole Ballard next month.   [facility] Care Management [phone]

## 2020-01-27 NOTE — Chronic Care Management (AMB) (Signed)
Chronic Care Management   Initial Visit Note   Name: Nicole Ballard MRN: [id number] DOB: [birth year]  Primary Care Provider: Steele Sizer, MD Reason for referral : Chronic Care Management   Nicole Ballard is a 64 y.o. year old female who is a primary care patient of Steele Sizer, MD. The CCM team was consulted for assistance with chronic disease management and care coordination. A telephonic assessment was conducted today.  Review of Ms. Hairston's status, including review of consultants reports, relevant labs and test results was conducted today. Collaboration with appropriate care team members was performed as part of the comprehensive evaluation and provision of chronic care management services.    SDOH (Social Determinants of Health) assessments performed: Yes See Care Plan activities for detailed interventions related to SDOH  SDOH Interventions     Most Recent Value  SDOH Interventions  SDOH Interventions for the Following Domains  Transportation  Stress Interventions  Other (Comment) [Reports outreach with assigned counselor. Being tx by Psychiatrist]  Transportation Interventions  Other (Comment) [Referral to Care Guide for assistance]       Medications: Outpatient Encounter Medications as of 01/24/2020  Medication Sig Note  . aspirin EC 81 MG tablet Take 1 tablet (81 mg total) by mouth daily.   Marland Kitchen atorvastatin (LIPITOR) 40 MG tablet TAKE 1 TABLET BY MOUTH  DAILY   . Cholecalciferol (VITAMIN D) 2000 UNITS CAPS Take 1 capsule by mouth daily. 03/18/2019: Needs to pick up from pharmacy  . clonazePAM (KLONOPIN) 0.5 MG tablet Take 0.5 tablets (0.25 mg total) by mouth daily.   . cyclobenzaprine (FLEXERIL) 5 MG tablet TAKE 1 TABLET BY MOUTH  EVERY 8 HOURS AS NEEDED FOR MUSCLE SPASMS. DO NOT DRIVE FOR 8 HOURS   . diclofenac sodium (VOLTAREN) 1 % GEL APP 2 GRAMS EXT AA QID 04/28/2015: Received from: External Pharmacy  . fluticasone (FLONASE) 50 MCG/ACT nasal spray Place 2  sprays into both nostrils daily.   . halobetasol (ULTRAVATE) 0.05 % ointment Apply topically.   . hydroxychloroquine (PLAQUENIL) 200 MG tablet Take 200 mg by mouth 2 (two) times daily.   . hydrOXYzine (ATARAX/VISTARIL) 10 MG tablet Take 1 tablet (10 mg total) by mouth every 6 (six) hours as needed.   . lamoTRIgine (LAMICTAL) 200 MG tablet Take 1 tablet (200 mg total) by mouth 2 (two) times daily.   . meclizine (ANTIVERT) 25 MG tablet Take 1 tablet (25 mg total) by mouth 3 (three) times daily as needed.   . Melatonin 10 MG TABS Take 1 tablet by mouth at bedtime.   Marland Kitchen olopatadine (PATANOL) 0.1 % ophthalmic solution Place 1 drop into both eyes 2 (two) times daily.   Marland Kitchen omeprazole (PRILOSEC) 40 MG capsule TAKE 1 CAPSULE BY MOUTH  DAILY   . ondansetron (ZOFRAN) 4 MG tablet Take 1 tablet (4 mg total) by mouth every 8 (eight) hours as needed for nausea or vomiting.   . Polyethylene Glycol 3350 (MIRALAX PO) Take by mouth.   . telmisartan (MICARDIS) 80 MG tablet Take 1 tablet (80 mg total) by mouth daily.   . traZODone (DESYREL) 100 MG tablet Take 200 mg by mouth at bedtime.    Marland Kitchen amLODipine (NORVASC) 2.5 MG tablet TAKE 1 TABLET BY MOUTH  DAILY 01/24/2020: Reports being advised to take '5mg'$ /day.  . Biotin 1 MG CAPS Take 1 capsule by mouth daily.   Marland Kitchen buPROPion (WELLBUTRIN XL) 150 MG 24 hr tablet Take 1 tablet (150 mg total) by mouth daily.   Marland Kitchen  DULoxetine (CYMBALTA) 60 MG capsule Take 120 mg by mouth at bedtime.    . gabapentin (NEURONTIN) 300 MG capsule Take 1 capsule by mouth at bedtime.   . mycophenolate (MYFORTIC) 360 MG TBEC EC tablet Take 360 mg by mouth 2 (two) times daily.   Marland Kitchen triamcinolone ointment (KENALOG) 0.1 %     No facility-administered encounter medications on file as of 01/24/2020.     Objective:  BP Readings from Last 3 Encounters:  11/11/19 133/85  11/08/19 (!) 144/76  11/07/19 (!) 160/90   Lab Results  Component Value Date   CHOL 143 02/07/2019   HDL 63 02/07/2019   LDLCALC 69  02/07/2019   TRIG 39 02/07/2019   CHOLHDL 2.3 02/07/2019   Functional Status Survey: Is the patient deaf or have difficulty hearing?: Yes(Reports decreased hearing in right ear.) Does the patient have difficulty seeing, even when wearing glasses/contacts?: No Does the patient have difficulty concentrating, remembering, or making decisions?: No Does the patient have difficulty walking or climbing stairs?: Yes(D/t joints and exertional shortness of breath) Does the patient have difficulty dressing or bathing?: No Does the patient have difficulty doing errands alone such as visiting a doctor's office or shopping?: No   Depression screen [facility] 2/9 01/24/2020 11/11/2019 11/07/2019  Decreased Interest 0 3 1  Down, Depressed, Hopeless '1 1 1  '$ PHQ - 2 Score '1 4 2  '$ Altered sleeping - 0 0  Tired, decreased energy - 1 2  Change in appetite - 3 3  Feeling bad or failure about yourself  - 0 3  Trouble concentrating - 0 3  Moving slowly or fidgety/restless - 0 2  Suicidal thoughts - 0 0  PHQ-9 Score - 8 15  Difficult doing work/chores - Somewhat difficult Somewhat difficult  Some recent data might be hidden     Goals Addressed            This Visit's Progress   . Chronic Disease Management       CARE PLAN ENTRY (see longitudinal plan of care for additional care plan information)  Current Barriers:  . Chronic Disease Management support and education needs related to Hypertension, Asthma, GERD, Chronic Kidney Disease, Dyslipidemia and Systemic lupus erythematosus (lung involvement)  Nurse Case Manager Clinical Goal(s):  Marland Kitchen Over the next 90 days, patient will not require hospitalization or emergent evaluation d/t chronic illnesses. . Over the next 90 days, patient will attend all medical appointments. . Over the next 90 days, patient will take all medications as prescribed. . Over the next 90 days, patient will weigh at least once a week and record readings. . Over the next 90 days, patient  will monitor blood pressure daily and record readings. . Over the next 90 days, patient will follow recommended safety measures to prevent falls and injuries. . Over the next 10 days, patient will work with Care Guides to discuss options for transportation assistance.  Interventions:  . Inter-disciplinary care team collaboration (see longitudinal plan of care) . Reviewed medications and discussed indications for use. Encouraged to take all medications as prescribed and notify team with concerns regarding prescriptions costs. Denies current concerns regarding medication management or prescription costs. Will recommend medication review/ reconciliation with Pharmacists if additional medications are prescribed. . Reviewed and discussed compliance with treatment recommendations. . Discussed established blood pressure parameters and indications for notifying provider. Encouraged to monitor blood pressure daily and record readings. . Discussed previous weight fluctuations and s/sx of fluid retention. Encouraged to weigh at least  once a week if unable to weigh daily. Advised to notify provider with weight gain greater than 3 pounds overnight or greater than 5 pounds in a week. . Discussed safety and fall prevention measures. Encouraged to engage in activity as tolerated and avoid overexertion. . Reviewed pending appointments. Encouraged to attend medical appointments as scheduled to prevent delays in care. Reports concerns regarding transportation. Agreeable to outreach with Care Guide to discuss options. . Discussed plan for ongoing care management and follow-up. Provided direct contact information. . Care Guide referral for transportation assistance.  Patient Self Care Activities:  . Self administers medications  . Attends scheduled provider appointments . Calls pharmacy for medication refills . Attends church or other social activities . Performs ADL's independently . Performs IADL's independently  . Calls provider office for new concerns or questions   Initial goal documentation         Ms. Jaycox was given information about Chronic Care Management services  including:  1. CCM service includes personalized support from designated clinical staff supervised by her physician, including individualized plan of care and coordination with other care providers 2. 24/7 contact phone numbers for assistance for urgent and routine care needs. 3. Service will only be billed when office clinical staff spend 20 minutes or more in a month to coordinate care. 4. Only one practitioner may furnish and bill the service in a calendar month. 5. The patient may stop CCM services at any time (effective at the end of the month) by phone call to the office staff. 6. The patient will be responsible for cost sharing (co-pay) of up to 20% of the service fee (after annual deductible is met).  Patient agreed to services and verbal consent obtained.    PLAN The care management team will follow-up with Ms. [place] next month.   [facility] [phone]

## 2020-01-27 NOTE — Telephone Encounter (Signed)
Copied from [place] [id number]. Topic: Referral - Status >> Jan 27, 2020 [id number] AM Simone Curia D wrote: [id number] Spoke with patient about [employer] transportation benefit, [employer] and [employer] transportation.  Explained about [facility] Medicare transportation benefit and the process for calling [employer] for local and trips to [facility].  Patient stated she was familiar with the process and has used them before.  Will call 01/28/20. Ambrose Mantle [phone]

## 2020-01-27 NOTE — Unmapped (Signed)
[facility] Refill Coordination Note    Specialty Medication(s) to be Shipped:   Inflammatory Disorders: mycophenolate    Other medication(s) to be shipped: n/a     Caitlin Smith, DOB: 05/06/1956  Phone: [phone] (home)       All above HIPAA information was verified with patient.     Was a Nurse, learning disability used for this call? No    Completed refill call assessment today to schedule patient's medication shipment from the [facility] [phone]).       Specialty medication(s) and dose(s) confirmed: Regimen is correct and unchanged.   Changes to medications: Caitlin Smith reports no changes at this time.  Changes to insurance: No  Questions for the pharmacist: No    Confirmed patient received Welcome Packet with first shipment. The patient will receive a drug information handout for each medication shipped and additional FDA Medication Guides as required.       DISEASE/MEDICATION-SPECIFIC INFORMATION        N/A    SPECIALTY MEDICATION ADHERENCE     Medication Adherence    Patient reported X missed doses in the last month: 0  Specialty Medication: mycophenolate 360mg                 Mycophenolate 360mg   : 13 days of medicine on hand         SHIPPING     Shipping address confirmed in Epic.     Delivery Scheduled: Yes, Expected medication delivery date: 6/2.     Medication will be delivered via Next Day Courier to the prescription address in Epic WAM.    Caitlin Smith   [facility] Specialty Technician

## 2020-01-28 DIAGNOSIS — M8949 Other hypertrophic osteoarthropathy, multiple sites: Principal | ICD-10-CM

## 2020-01-28 MED ORDER — DICLOFENAC 1 % TOPICAL GEL
Freq: Four times a day (QID) | TOPICAL | 5 refills | 19 days
Start: 2020-01-28 — End: ?

## 2020-01-28 NOTE — Unmapped (Signed)
Previous office visit: 02/18/2019    Future appointment: None scheduled    Request is for diclofenac sodium (VOLTAREN) 1 % gel, #300 g    Apply 4 g topically Four (4) times a day    Please refill if appropriate

## 2020-01-29 ENCOUNTER — Institutional Professional Consult (permissible substitution): Admit: 2020-01-29 | Discharge: 2020-01-30 | Payer: MEDICARE | Attending: Psychologist | Primary: Psychologist

## 2020-01-29 DIAGNOSIS — H905 Unspecified sensorineural hearing loss: Principal | ICD-10-CM

## 2020-01-29 DIAGNOSIS — F4310 Post-traumatic stress disorder, unspecified: Principal | ICD-10-CM

## 2020-01-29 DIAGNOSIS — F411 Generalized anxiety disorder: Principal | ICD-10-CM

## 2020-01-29 DIAGNOSIS — F331 Major depressive disorder, recurrent, moderate: Principal | ICD-10-CM

## 2020-01-29 DIAGNOSIS — F410 Panic disorder [episodic paroxysmal anxiety] without agoraphobia: Principal | ICD-10-CM
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
## 2020-01-29 NOTE — Unmapped (Signed)
[facility]   Confidential Psychological Therapy Session      Patient Name: Caitlin Smith  Medical Record Number: [id number]  Date of Service: Jan 29, 2020  Attending Psychologist: Caroline More, PhD  CPT Procedure Codes: 81191 for 45 mins of face to face counseling    Due to the current declared state emergency during the coronavirus pandemic, all non-urgent medical and mental health visits have been triaged to either be delayed or take place virtually via phone or videoconferencing.   Due to the need for continued patient interaction for mental health care, pain management, and care coordination that necessitates the involvement of this provider, this visit was performed face to face using interactive technology using a HIPPA compliant audio/visual platform. This patient will be scheduled for face to face visits in the future once it has been deemed safe.      We reviewed confidentiality today. The patient was present at home (location and contact information confirmed), attended this visit alone, and consented to this virtual pain psychology visit.     Visit modifiers:   GT for Interactive Technology and CR for catastrophe/disaster related due to coronavirus pandemic    REFERRING PHYSICIAN: Clarene Essex, MD    CHIEF COMPLAINT AND REASON FOR VISIT: pain coping skills, CBT to address depression and anxiety in the setting of chronic pain    SUBJECTIVE / HISTORY OF PRESENT ILLNESS: Ms.  Caitlin Smith is a very pleasant 64 y.o.  female from [place], [place] with multiple chronic pain complaints related to fibromyalgia and lupus who initially met with me in October 2016, and which time she was diagnosed with severe depression, PTSD, panic disorder, and generalized anxiety.The patient returns for a therapy session today. Last follow up with me was 01/14/20.        OBJECTIVE / MENTAL STATUS:    Appearance:   Appears stated age and Clean/Neat   Motor:  No abnormal movements   Speech/Language: Normal rate, volume, tone, fluency   Mood:  Depressed and Anxious   Affect:  blunted   Thought process:  Logical, linear, clear, coherent, goal directed   Thought content:    Denies SI, HI, self harm, delusions, obsessions, paranoid ideation, or ideas of reference   Perceptual disturbances:    Denies auditory and visual hallucinations, behavior not concerning for response to internal stimuli   Orientation:  Oriented to person, place, time, and general circumstances   Attention:  Able to fully attend without fluctuations in consciousness   Concentration:  Able to fully concentrate and attend   Memory:  Immediate, short-term, long-term, and recall grossly intact    Fund of knowledge:   Consistent with level of education and development   Insight:    Fair   Judgment:   Intact   Impulse Control:  Intact     DIAGNOSTIC IMPRESSION:   Post Traumatic Stress Disorder (PTSD)  Generalized Anxiety Disorder (GAD)  Panic Disorder  Major Depressive Disorder, moderate, recurrent  Chronic pain syndrome  Fibromyalgia  Lupus    ASSESSMENT:   Ms.  Caitlin Smith is a very pleasant 64 y.o.  female from [place], [place] with multiple chronic pain complaints related to lupus, arthritis, and fibromyalgia. She was previously seen in our clinic from 2012-2014, and reestablished care with Dr. Fayrene Fearing in August 2016. The patient is struggling with depression and anxiety, but is very motivated to participate in intensive, multidisciplinary treatment to address the connection between depression, anxiety and pain. She has a long-standing history of  depression and anxiety, and has worked with outpatient psychiatrist and therapist over the years. She is currently established with [facility] psychiatry, and is tapering off of Klonopin.  Depression, anxiety, and panic have all improved.  Has been diagnosed with diabetes, and is managing it well with p.o. medication and dietary changes.  Had lost 50+ pounds but regained 20 lbs recently.  Patient presents with low-grade depression related to COVID and some social isolation.       PLAN:   (1) Psychotherapy - Continue CBT. CBT will be used to address PTSD, MDD, Panic D/o, GAD, and chronic pain.     --Behavioral Activation - Encouraged behavioral activation.  Is doing great, walking 5 days/week  --Downloaded and uses Insight Timer, guided relaxation app, for nightly practice.  --Continues to utilize diaphragmatic breathing daily    (2) Psychiatry - Pt currently established with [facility].  - to follow up with her psychiatrist and discuss low mood despite reduced stress and increased behavioral activation. To ask about possibility of adjusting mood stabilizer (Lamictal) versus other possible changes to help improve mood. (in psychotherapy will cont beh activation and identified need for increased socialization - this has changed post covid related school changes, is alone much more these days with her granddaughter back in school)    (3) Safety - Pt denies any current or recent SI or safety concerns. Knows to call 911 or go to her local ED. Also previously given [facility].      (4) follow-up - with me in 1 month for increased socialization - this has changed post covid related school changes, is alone much more these days with her granddaughter back in school)    (3) Safety - Pt denies any current or recent SI or safety concerns. Knows to call 911 or go to her local ED. Also previously given [facility].      (4) follow-up - with me in 1 month

## 2020-01-30 ENCOUNTER — Telehealth: Payer: Self-pay

## 2020-01-30 ENCOUNTER — Encounter: Admit: 2020-01-30 | Discharge: 2020-01-30 | Payer: MEDICARE | Attending: Audiologist | Primary: Audiologist

## 2020-01-30 ENCOUNTER — Ambulatory Visit: Admit: 2020-01-30 | Discharge: 2020-01-30 | Payer: MEDICARE

## 2020-01-30 DIAGNOSIS — H6123 Impacted cerumen, bilateral: Secondary | ICD-10-CM | POA: Diagnosis not present

## 2020-01-30 DIAGNOSIS — H9211 Otorrhea, right ear: Secondary | ICD-10-CM | POA: Diagnosis not present

## 2020-01-30 DIAGNOSIS — N181 Chronic kidney disease, stage 1: Secondary | ICD-10-CM | POA: Diagnosis not present

## 2020-01-30 DIAGNOSIS — Z9289 Personal history of other medical treatment: Secondary | ICD-10-CM | POA: Diagnosis not present

## 2020-01-30 DIAGNOSIS — J45909 Unspecified asthma, uncomplicated: Secondary | ICD-10-CM | POA: Diagnosis not present

## 2020-01-30 DIAGNOSIS — Z880 Allergy status to penicillin: Secondary | ICD-10-CM | POA: Diagnosis not present

## 2020-01-30 DIAGNOSIS — G894 Chronic pain syndrome: Secondary | ICD-10-CM | POA: Diagnosis not present

## 2020-01-30 DIAGNOSIS — M1990 Unspecified osteoarthritis, unspecified site: Secondary | ICD-10-CM | POA: Diagnosis not present

## 2020-01-30 DIAGNOSIS — H9201 Otalgia, right ear: Secondary | ICD-10-CM | POA: Diagnosis not present

## 2020-01-30 DIAGNOSIS — H918X1 Other specified hearing loss, right ear: Principal | ICD-10-CM

## 2020-01-30 NOTE — Telephone Encounter (Signed)
Copied from [place] [phone]. Topic: Referral - Status >> Jan 30, 2020 [id number] AM Simone Curia D wrote: [id number] Follow-up call.  Left message on voicemail for patient to return my cal regarding transportation resources given. Ambrose Mantle [phone]

## 2020-01-30 NOTE — Unmapped (Signed)
AUDIOLOGIC EVALUATION    [PAIN 0/10]. Caitlin Smith presents to [facility] for an audiologic evaluation. Patient is being seen in conjunction with Dr. Melton Krebs.    HISTORY: Caitlin Smith is a 64 y.o. 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 Patient reports some dizziness, imbalance, and intermittent roaring tinnitus in her right ear only. Patient reports no drainage, fullness, or pressure. Patient reports that her primary physician cleaned her right ear about a month ago which caused her right ear to bleed. Patient has not looked into amplification for her right ear.    RESULTS: Today's results were obtained using insert headphones with good reliability.  Partially occluding debris noted in the ear canals.    RIGHT ear: Mild sloping to severe sensorineural hearing loss  Speech Awareness Threshold: 45 dB HL, patient unable to repeat words even with limited field choice  Word Recognition Score: DNT    LEFT ear: Hearing within normal limits  Speech Reception Threshold: 15 dB HL  Word Recognition Score: 96% @ 55 dB HL (using Recorded NU-6 words)    Tympanometry was administered today to assess middle ear status.  RIGHT ear: Consistent with normal middle ear function  LEFT ear: Consistent with normal middle ear function    Today's results are diminished for the RIGHT ear when compared with last audiogram on 11/29/2016.     *SEE AUDIOGRAM IMAGE IN MEDIA TAB*    RECOMMENDATIONS:  ??? ENT - patient is seeing Dr. Melton Krebs today  ??? Re-evaluate per MD or sooner with any changes or concerns regarding hearing status  ??? Consider trial with Cros amplification pending medical clearance and patient motivation    Dolan Amen, B.S.  [place] Doctoral Student    I was physically present and immediately available to direct and supervise tasks that were related to patient management. The direction and supervision was continuous throughout the time these tasks were performed.    We wore appropriate PPE throughout entire appointment (face mask and eye protection). Patient also wore face mask appropriately for the entire appointment.    Procedure(s):  1) CPT 92557 - Comprehensive Audio Eval & Speech Recognition  2) CPT E974542 - Tympanometry    Visit Time: 45 min    Arra Connaughton R Jorene Kaylor, AuD  Clinical Audiologist

## 2020-01-31 ENCOUNTER — Telehealth: Payer: Self-pay

## 2020-01-31 NOTE — Telephone Encounter (Signed)
Copied from [place] [id number]. Topic: Referral - Status >> Jan 31, 2020 [id number] AM Simone Curia D wrote: [id number] Follow-up call.  Spoke with patient she will call the transportation resources given  [employer] transportation, [employer] and [employer] for her next appointment in [place].  No other resources needed at this time. Closing referral. Ambrose Mantle [phone]

## 2020-02-04 ENCOUNTER — Encounter: Payer: Self-pay | Admitting: Family Medicine

## 2020-02-04 MED FILL — MYCOPHENOLATE SODIUM 360 MG TABLET,DELAYED RELEASE: 90 days supply | Qty: 180 | Fill #0 | Status: AC

## 2020-02-05 ENCOUNTER — Telehealth
Admit: 2020-02-05 | Discharge: 2020-02-06 | Payer: MEDICARE | Attending: Student in an Organized Health Care Education/Training Program | Primary: Student in an Organized Health Care Education/Training Program

## 2020-02-05 ENCOUNTER — Ambulatory Visit: Admit: 2020-02-05 | Payer: MEDICARE

## 2020-02-05 NOTE — Unmapped (Signed)
[facility]  Psychiatry Telehealth Encounter  Established Patient     I spent 31 minutes on the phone with the patient. I spent an additional 10 minutes on pre- and post-visit activities.     The patient was physically located in [place] or a state in which I am permitted to provide care. The patient and/or parent/guardian understood that s/he may incur co-pays and cost sharing, and agreed to the telemedicine visit. The visit was reasonable and appropriate under the circumstances given the patient's presentation at the time.    The patient and/or parent/guardian has been advised of the potential risks and limitations of this mode of treatment (including, but not limited to, the absence of in-person examination) and has agreed to be treated using telemedicine. The patient's/patient's family's questions regarding telemedicine have been answered.     If the visit was completed in an ambulatory setting, the patient and/or parent/guardian has also been advised to contact their provider???s office for worsening conditions, and seek emergency medical treatment and/or call 911 if the patient deems either necessary.    Assessment:  Caitlin Smith is a 64 y.o. female with a history of MDD, GAD, and Panic Disorder in the context of many chronic medical conditions including chronic pain/fibromyalgia, osteoarthritis with degenerative disc disease, Lupus, pituitary tumor (benign), s/p right total knee arthroplasty, and VIN II (s/p resection), who presents for follow-up evaluation. She has been maintained on Lamictal for many years as well as cymbalta, trazodone, klonopin, and wellbutrin. Patient has previously tolerated a slight taper in her klonopin from 0.75 mg to 0.25 mg qHS overtime. Her mood is often exacerbated by steroid use for current treatment of pulmonary disease. Patient is engaged with CBT therapy at the pain clinic.    Met Ms. Cech today 02/05/20 for epic video-visit.  She continues to report diminished mood. She reports frustrations, as well as her non-functioning car that limits her current independence. Discussed seeing if it can be towed to Nutritional therapist. We plan to decrease trazodone again today given better sleep with melatonin, in our continued effort to reduce polypharmacy.  Will follow-up in 2-3 months.    For Next Visit:  [ ]  Discuss how taper of trazodone is going (dec to 100 from 150)  [ ]  Discuss mood, depression  [ ]  Discuss getting labs for continued monitoring of lamictal, as well as checking TSH, vitamin levels when she comes in person    Risk Assessment:  A full suicide and violence risk assessment was performed as part of this patient's initial evaluation with [facility] outpatient psychiatry.  There is no new acute risk for suicide or violence at this time. The patient was educated about relevant modifiable risk factors including following recommendations for treatment of psychiatric illness and abstaining from substance abuse.  While future psychiatric events cannot be accurately predicted, the patient does not currently require acute inpatient psychiatric care and does not currently meet [facility] involuntary commitment criteria.    Current Medication Regimen:  -- Lamictal 400mg  qhs (#180, 2 refills on 01/07/20)  -- Cymbalta 120mg  qAM (#180, 2 refills on 01/07/20)  -- Wellbutrin XL 150mg  qAM (#90, 2 refills on 01/07/20)  -- Clonazepam 0.25mg  qHS, appropriate filling per NCCSRS (#15, 2 refills to [facility] on 01/09/20)  -- Trazodone 150mg  at bedtime (#135, 2 refills on 01/07/20)   100mg  trazodone nightly- try half a tab.          Plan:    # Depression - Anxiety - Panic  Disorder  Status: Moderate worsening for 2 weeks per patient report  - Continue Lamictal 400mg  qhs for mood  - Continue Cymbalta 120mg  qAM to target depressive sx and chronic pain  - Continue Wellbutrin XL 150mg  qAM   - Continue Clonazepam 0.25mg  qHS, appropriate filling per NCCSRS  - Continue CBT at pain clinic; advised her to see if therapist willing to talk some about childhood, which is a stressor for patient     # Insomnia: Last sleep study performed in 2017. Per chart review, showed mild OSA likely related to medication use. Neurology recommends patient avoid sleeping on back as she likely has OSA when sleeping in this position    Status: Stable, improved. Would like to continue to work to decrease polypharmacy in future, may focus on trazodone, especially as patient has found positive result in melatonin  --Continue melatonin 3-5mg  at bedtime   -- DECREASE trazodone to 100mg  qHS for insomnia (d5/4/21, d 02/05/20)  - Clonazepam per above    # Mild Neurocognitive Impairment: Patient seen by Neurology ([facility]) 05/22/18 who dx pt with mild neurocognitive impairment w/ deficits in inattention and visual-spatial function with suspicion that sleep apnea and polypharmacy may be contributing to her difficulties. Recommended that psychopharm regimen be simplified, including eliminating trazodone and considering changing Wellbutrin to Zoloft or Lexapro for anxiety and eliminating Klonopin when stable.     Status: Ongoing, no new testing as of 11/2019, we have not been able to decrease clonazepam or discontinue wellbutrin. Pandemic and social atmosphere make changes in pharm more challenging at this time; attempting to target trazodone first given improved sleep  - Tapering trazodone at this visit  - However, it is very likely that patient's lupus is significantly contributing to cognitive decline, which will limit improvement she will see with reduction in polypharmacy  --Last MOCA 22 on 12/12/2017    # Medical Monitoring - High Risk Medication Use  - The complexity of this patient's care involves drug therapy which requires intensive monitoring for toxicity. This is in part due to a narrow therapeutic window, as well as potential for toxicity. This patient is being treated with Lamotrigine (Lamictal) to target their psychiatric disorder, Major depressive disorder, refractory. In regards to this medication being considered high risk, Lamotrigine (lamictal) has black box warning for serious rash including fatal reaction of Stevens-Johnson syndrome and rare cases of toxic epidermal necrolysis. In addition to prolonged titration to mitigate risk of serious rash, lamotrigine requires monitoring of hepatic and renal function at baseline, and at times will require monitoring of lamotrigine blood levels.    Lamotrigine Monitoring  [ ]  Order hepatic testing, platelets at next in person visit  - Hepatic function testing, platelets (via CBC) q6 months  -AST/ALT WNL     Lab Results   Component Value Date    WBC 5.1 11/09/2019    HGB 14.5 11/09/2019    HCT 43.4 11/09/2019    PLT 353 11/09/2019       Lab Results   Component Value Date    NA 141 11/09/2019    K 4.3 11/09/2019    CL 100 11/09/2019    CO2 29.0 11/09/2019    BUN 13 11/09/2019    CREATININE 0.89 11/09/2019    GLU 74 11/09/2019    CALCIUM 9.8 11/09/2019    MG 2.1 12/31/2013    PHOS 4.9 (H) 12/31/2013       Lab Results   Component Value Date    BILITOT 0.7 11/09/2019  BILIDIR <0.10 05/01/2018    PROT 8.7 (H) 11/09/2019    ALBUMIN 4.5 11/09/2019    ALT 32 11/09/2019    AST 39 (H) 11/09/2019    ALKPHOS 98 11/09/2019    GGT 19 08/26/2012       Lab Results   Component Value Date    PT 12.0 09/17/2012    INR 1.11 01/03/2014    APTT 30.8 01/03/2014     # Return to Clinic: 1 month    Psychotherapy:  No billable psychotherapy service provided but brief supportive therapy was utilized.    Patient has been given this writer's contact information as well as the [facility] Psychiatry urgent line number. The patient has been instructed to call 911 for emergencies.    Patient and plan of care were discussed with the Attending MD,Dr. Cecilio Asper, who agrees with the above statement and plan.     Connye Burkitt MD    Subjective:     Psychiatric Chief Concern:  Follow-up psychiatric evaluation for depression, anxiety and insomnia.     Interval History: Met with Ms. Beam via video today since her car isn't working.  We strategize for ways to help get it fixed. We talk about political and social stressors that are ongoing. Sleep is improved, and she had a helpful therapy session. Given improved sleep we plan to continue decreasing trazodone; encouraged her to call me with any concerns about this. She wants to hang off from increasing medicine for now, but asks about increasing lamictal. Explained that for depression that would not be my first move since she is already on a high dose, but that we might try something else. We talk about other strategies first like getting her car going, as well as that her granddaughter will be home soon which often helps her mood.     Social History: reviewed; patient recently moved apartments within the same complex    ROS:  As per Interval History and:  Constitutional:  no significant appetite change. Anxiety, dysthymia worsened  Neuro:  none / negative    Objective:    Mental Status Exam:   Appearance  Well-appeaing, INAD       Speech/Language:    Normal rate, volume, tone, fluency and Language intact, well formed   Affect  Mildly anxious, mood congruent   Mood:   my car is still broken!   Thought process and Associations:   Logical, linear, clear, coherent, goal directed   Abnormal/psychotic thought content:     Denies SI, HI, self harm, delusions, obsessions, paranoid ideation, or ideas of reference   Perceptual disturbances:     Does not endorse auditory or visual hallucinations     Orientation:   Oriented to person, place, time, and general circumstances   Insight:     Intact   Judgment:    Intact   Impulse Control:   Intact     Physical Exam: UTA phone visit    Medications: reviewed at today's visit    Psychometrics:  Psych Scale Scores - Adult      Office Visit from 09/04/2018 in [facility] AT [facility]   **PHQ-9: Severity Measure for DEPRESSION Total Score**  20 Collected on 09/04/2018 0000   WHODAS 2.0 (Self-administered) - Total Score  42 Collected on 09/04/2018 0000          Posey Rea, MD

## 2020-02-07 ENCOUNTER — Other Ambulatory Visit: Payer: Self-pay | Admitting: Family Medicine

## 2020-02-07 ENCOUNTER — Telehealth: Payer: Self-pay | Admitting: Family Medicine

## 2020-02-07 DIAGNOSIS — I10 Essential (primary) hypertension: Secondary | ICD-10-CM

## 2020-02-07 MED ORDER — AMLODIPINE BESYLATE 5 MG PO TABS: 5.0000 mg | ORAL_TABLET | Freq: Every day | ORAL | 1 refills | Status: DC

## 2020-02-07 NOTE — Telephone Encounter (Signed)
Medication Refill - Medication:  amLODipine (NORVASC) 5 MG tablet   Has the patient contacted their pharmacy? Yes.   (Agent: If no, request that the patient contact the pharmacy for the refill.) (Agent: If yes, when and what did the pharmacy advise?)  Preferred Pharmacy (with phone number or street name):  [id number] [address], [address]  [place] [address]  Phone: [phone] Fax: [phone]     Agent: Please be advised that RX refills may take up to 3 business days. We ask that you follow-up with your pharmacy.

## 2020-02-07 NOTE — Telephone Encounter (Signed)
Per initial request, Medication Refill - Medication:  amLODipine (NORVASC) 5 MG tablet   Has the patient contacted their pharmacy? Yes.   (Agent: If no, request that the patient contact the pharmacy for the refill.) (Agent: If yes, when and what did the pharmacy advise?)  Preferred Pharmacy (with phone number or street name):  [facility] #[id number] [address], [address] - [address]  [place] [address]  Phone: [phone] Fax: [phone]     Agent: Please be advised that RX refills may take up to 3 business days. We ask that you follow-up with your pharmacy.   Refill request for Amlodipine 5 mg; medication not on active medication list; contacted pt to verify dose; contacted pt and she states her dose of 2.5 mg increased by her nephrologist "2 months ago"; to 5 mg; the pt says this MD does not write her prescriptions; she sees Dr Ancil Boozer, [place];  will route to office for final disposition.

## 2020-02-14 ENCOUNTER — Other Ambulatory Visit: Payer: Self-pay

## 2020-02-14 ENCOUNTER — Encounter: Payer: Self-pay | Admitting: Family Medicine

## 2020-02-14 ENCOUNTER — Ambulatory Visit (INDEPENDENT_AMBULATORY_CARE_PROVIDER_SITE_OTHER): Payer: Medicare Other | Admitting: Family Medicine

## 2020-02-14 VITALS — BP 136/72 | HR 93 | Temp 97.9°F | Resp 16 | Ht 68.0 in | Wt 221.1 lb

## 2020-02-14 DIAGNOSIS — E236 Other disorders of pituitary gland: Secondary | ICD-10-CM

## 2020-02-14 DIAGNOSIS — E1169 Type 2 diabetes mellitus with other specified complication: Secondary | ICD-10-CM | POA: Diagnosis not present

## 2020-02-14 DIAGNOSIS — E669 Obesity, unspecified: Secondary | ICD-10-CM

## 2020-02-14 DIAGNOSIS — E785 Hyperlipidemia, unspecified: Secondary | ICD-10-CM

## 2020-02-14 DIAGNOSIS — L304 Erythema intertrigo: Secondary | ICD-10-CM

## 2020-02-14 DIAGNOSIS — I10 Essential (primary) hypertension: Secondary | ICD-10-CM

## 2020-02-14 DIAGNOSIS — M3213 Lung involvement in systemic lupus erythematosus: Secondary | ICD-10-CM

## 2020-02-14 DIAGNOSIS — F339 Major depressive disorder, recurrent, unspecified: Secondary | ICD-10-CM

## 2020-02-14 DIAGNOSIS — M797 Fibromyalgia: Secondary | ICD-10-CM

## 2020-02-14 DIAGNOSIS — J8410 Pulmonary fibrosis, unspecified: Secondary | ICD-10-CM

## 2020-02-14 LAB — POCT GLYCOSYLATED HEMOGLOBIN (HGB A1C): Hemoglobin A1C: 5.6 % (ref 4.0–5.6)

## 2020-02-14 MED ORDER — CLOTRIMAZOLE-BETAMETHASONE 1 %-0.05 % TOPICAL CREAM
Freq: Two times a day (BID) | TOPICAL | 0.00000 days
Start: 2020-02-14 — End: ?

## 2020-02-14 MED ORDER — CLOTRIMAZOLE-BETAMETHASONE 1-0.05 % EX CREA: 1.0000 "application " | TOPICAL_CREAM | Freq: Two times a day (BID) | CUTANEOUS | 0 refills | Status: DC

## 2020-02-14 NOTE — Addendum Note (Signed)
Addended by: Docia Furl on: 02/14/2020 02:23 PM   Modules accepted: Orders

## 2020-02-14 NOTE — Patient Instructions (Signed)
Take amlodipine at night and Telmisartan in the morning

## 2020-02-14 NOTE — Progress Notes (Signed)
Name: Nicole Ballard   MRN: [id number]    DOB: 07/01/1956   Date:02/14/2020       Progress Note  Subjective  Chief Complaint  Chief Complaint  Patient presents with  . Hypertension    follow up 159/83  . Rash    between leg from sweating when exercising, red, irritated    HPI  HTN:She has recurrent chest pain and has seen cardiologist, she had a stress test done [facility] that was normal, bp today is at goal, but she states at home it has been in the 150's/90's, even when the health nurse, she usually checks bp in the morning. We will try switching norvasc 5 mg to pm and continue Telmisartan in am, return with bp monitor in one week for cma visit and if needed we will increase amlodipine to bid   INTERPRETATION( done 02/2019 ) Normal Stress Echocardiogram NORMAL RIGHT VENTRICULAR SYSTOLIC FUNCTION TRIVIAL REGURGITATION NOTED (See above) NO VALVULAR STENOSIS NOTED  DM: denies polyphagia, polyuria or polydipsia, she has gained weight , she states was home schooled her grand-daughter and was not active during the pandemic Last A1C was  5.5% 10/2019 . She is back at the gym and eating healthier but has not been losing weight as expected.   MDD/Mild Neurocognitive Impairment: under the care of psychiatrist,she is still taking medications as prescribed, worrying more about the safety of her grandsons and sons, because of police brutality. She states still very anxious , having to see psychiatrist more often, also sees a therapist   Dyslipidemia: reviewed last labs, LDL was 69. She denies side effects.   Post-inflammatory pulmonary fibrosis/RAD: under the care of pulmonologist at [facility], she has occasional cough she continues to have SOB - likely multifactorial. She has been using Flovent again lately   Lupus involvement lungs: she is seeing Rheumatologist , taking plaquenil but continues to have some SOB , she is up to date with eye exam.   Obesity: her BMI was down to 29.94 11 months  ago, today it is up to  BMI above 33.62  , she is going to the gym again, she states she lost a tooth and is unable to eat apples like she used to.   GERD: she has noticed increase in throat burning and has contacted ENT, she has dysphonia and states on knew medication but not sure of the name, she will send me a mychart message about it   Rash: on both inguinal areas, itchy, red and irritated   Vertigo: she still has intermittent symptoms takes Meclizine and also zofran from chronic nausea She states episodes not as often now  Empty Sella: she had repeat MRI, no longer her pituitary adenoma.   Patient Active Problem List   Diagnosis Date Noted  . Hyperglycemia 10/23/2017  . Vertigo 09/29/2016  . Bronchiolitis obliterans organizing pneumonia ([place]) 09/29/2016  . Neck pain, musculoskeletal 12/15/2015  . Abnormal CAT scan 04/11/2015  . Auditory impairment 04/11/2015  . Insomnia, persistent 04/11/2015  . Chronic kidney disease (CKD), stage I 04/11/2015  . Chronic nonmalignant pain 04/11/2015  . Chronic constipation 04/11/2015  . Dyslipidemia 04/11/2015  . Fibromyalgia 04/11/2015  . Gastro-esophageal reflux disease without esophagitis 04/11/2015  . Dysphonia 04/11/2015  . Low back pain 04/11/2015  . Eczema intertrigo 04/11/2015  . Keratosis pilaris 04/11/2015  . Chronic recurrent major depressive disorder ([place]) 04/11/2015  . Dysmetabolic syndrome 25/01/3975  . Neurogenic claudication 04/11/2015  . Perennial allergic rhinitis with seasonal variation 04/11/2015  . Abnormal  presence of protein in urine 04/11/2015  . Seborrhea capitis 04/11/2015  . Moderate dysplasia of vulva 04/11/2015  . Vitamin D deficiency 04/11/2015  . Spinal stenosis of lumbar region 04/11/2015  . Treadmill stress test negative for angina pectoris 03/05/2015  . Shortness of breath 05/20/2014  . Asthma, chronic 12/31/2013  . H/O total knee replacement 12/31/2013  . Episcleritis 09/26/2013  . Vocal cord  dysfunction 05/28/2013  . Anxiety, generalized 03/19/2013  . Back pain 12/18/2012  . Pulmonary nodules 11/26/2012  . Lung nodule, multiple 11/26/2012  . Arthritis, degenerative 11/01/2012  . H/O aspiration pneumonitis 10/22/2012  . Postinflammatory pulmonary fibrosis ([place]) 10/22/2012  . Mixed incontinence 05/04/2012  . Benign neoplasm of pituitary gland and craniopharyngeal duct ([place]) 12/29/2010  . Benign neoplasm of pituitary gland ([place]) 12/29/2010  . Lung involvement in systemic lupus erythematosus ([place]) 11/05/2010  . Chronic nausea 07/01/2008  . Benign hypertension 01/25/2005    Past Surgical History:  Procedure Laterality Date  . BRAIN SURGERY    . CHOLECYSTECTOMY    . VAGINAL HYSTERECTOMY      Family History  Problem Relation Age of Onset  . Breast cancer Mother   . Cancer Mother 28       Breast  . Cancer Father 52       Stomach  . Breast cancer Maternal Aunt     Social History   Tobacco Use  . Smoking status: Never Smoker  . Smokeless tobacco: Never Used  Substance Use Topics  . Alcohol use: No    Alcohol/week: 0.0 standard drinks     Current Outpatient Medications:  .  amLODipine (NORVASC) 5 MG tablet, Take 1 tablet (5 mg total) by mouth daily., Disp: 90 tablet, Rfl: 1 .  aspirin EC 81 MG tablet, Take 1 tablet (81 mg total) by mouth daily., Disp: 30 tablet, Rfl: 0 .  atorvastatin (LIPITOR) 40 MG tablet, TAKE 1 TABLET BY MOUTH  DAILY, Disp: 90 tablet, Rfl: 3 .  Biotin 1 MG CAPS, Take 1 capsule by mouth daily., Disp: 30 capsule, Rfl: 0 .  buPROPion (WELLBUTRIN XL) 150 MG 24 hr tablet, Take 1 tablet (150 mg total) by mouth daily., Disp: 30 tablet, Rfl: 0 .  Cholecalciferol (VITAMIN D) 2000 UNITS CAPS, Take 1 capsule by mouth daily., Disp: , Rfl:  .  clonazePAM (KLONOPIN) 0.5 MG tablet, Take 0.5 tablets (0.25 mg total) by mouth daily., Disp: 15 tablet, Rfl: 0 .  diclofenac sodium (VOLTAREN) 1 % GEL, APP 2 GRAMS EXT AA QID, Disp: , Rfl: 6 .  DULoxetine  (CYMBALTA) 60 MG capsule, Take 120 mg by mouth at bedtime. , Disp: , Rfl:  .  fluticasone (FLONASE) 50 MCG/ACT nasal spray, Place 2 sprays into both nostrils daily., Disp: 48 g, Rfl: 3 .  halobetasol (ULTRAVATE) 0.05 % ointment, Apply topically., Disp: , Rfl:  .  hydroxychloroquine (PLAQUENIL) 200 MG tablet, Take 200 mg by mouth 2 (two) times daily., Disp: , Rfl:  .  hydrOXYzine (ATARAX/VISTARIL) 10 MG tablet, Take 1 tablet (10 mg total) by mouth every 6 (six) hours as needed., Disp: 60 tablet, Rfl: 0 .  lamoTRIgine (LAMICTAL) 200 MG tablet, Take 1 tablet (200 mg total) by mouth 2 (two) times daily., Disp: 60 tablet, Rfl: 0 .  meclizine (ANTIVERT) 25 MG tablet, Take 1 tablet (25 mg total) by mouth 3 (three) times daily as needed., Disp: 30 tablet, Rfl: 0 .  Melatonin 10 MG TABS, Take 1 tablet by mouth at bedtime., Disp: , Rfl:  .  mycophenolate (MYFORTIC) 360 MG TBEC EC tablet, Take 360 mg by mouth 2 (two) times daily., Disp: , Rfl:  .  olopatadine (PATANOL) 0.1 % ophthalmic solution, Place 1 drop into both eyes 2 (two) times daily., Disp: 5 mL, Rfl: 3 .  omeprazole (PRILOSEC) 40 MG capsule, TAKE 1 CAPSULE BY MOUTH  DAILY, Disp: 90 capsule, Rfl: 3 .  ondansetron (ZOFRAN) 4 MG tablet, TAKE 1 TABLET BY MOUTH  EVERY 8 HOURS AS NEEDED FOR NAUSEA AND VOMITING, Disp: 90 tablet, Rfl: 0 .  Polyethylene Glycol 3350 (MIRALAX PO), Take by mouth., Disp: , Rfl:  .  telmisartan (MICARDIS) 80 MG tablet, Take 1 tablet (80 mg total) by mouth daily., Disp: 90 tablet, Rfl: 1 .  traZODone (DESYREL) 100 MG tablet, Take 200 mg by mouth at bedtime. , Disp: , Rfl:  .  triamcinolone ointment (KENALOG) 0.1 %, , Disp: , Rfl:  .  cyclobenzaprine (FLEXERIL) 5 MG tablet, TAKE 1 TABLET BY MOUTH  EVERY 8 HOURS AS NEEDED FOR MUSCLE SPASMS. DO NOT DRIVE FOR 8 HOURS (Patient not taking: Reported on 02/14/2020), Disp: 90 tablet, Rfl: 1 .  gabapentin (NEURONTIN) 300 MG capsule, Take 1 capsule by mouth at bedtime. (Patient not taking:  Reported on 02/14/2020), Disp: , Rfl:   Allergies  Allergen Reactions  . Ace Inhibitors     Other reaction(s): OTHER Pt states she can not take ace inhibitors. Pt states she can not remember her reaction.   . Bee Pollen   . Penicillins   . Pollen Extract     I personally reviewed active problem list, medication list, allergies, family history, social history, health maintenance with the patient/caregiver today.   ROS  Constitutional: Negative for fever but has some  weight change.  Respiratory:Positive for cough and shortness of breath.   Cardiovascular: Negative for chest pain or palpitations.  Gastrointestinal: Negative for abdominal pain, no bowel changes.  Musculoskeletal: Negative for gait problem or joint swelling.  Skin: Negative for rash.  Neurological: positive  for dizziness or headache.  No other specific complaints in a complete review of systems (except as listed in HPI above).   Objective  Vitals:   02/14/20 0744  BP: 136/72  Pulse: 93  Resp: 16  Temp: 97.9 F (36.6 C)  TempSrc: Temporal  SpO2: 94%  Weight: 221 lb 1.6 oz (100.3 kg)  Height: 5\' 8"  (1.727 m)    Body mass index is 33.62 kg/m.  Physical Exam  Constitutional: Patient appears well-developed and well-nourished. ObeseNo distress.  HEENT: head atraumatic, normocephalic, pupils equal and reactive to light,  neck supple Cardiovascular: Normal rate, regular rhythm and normal heart sounds.  No murmur heard. No BLE edema. Pulmonary/Chest: Effort normal and breath sounds normal. No respiratory distress. Abdominal: Soft.  There is no tenderness. Skin: hyperpigmentation, some redness on groin area bilaterally  Psychiatric: Patient has a normal mood and affect. behavior is normal. Judgment and thought content normal.   PHQ2/9: Depression screen [facility] 2/9 02/14/2020 01/24/2020 11/11/2019 11/07/2019 10/09/2019  Decreased Interest 1 0 3 1 1   Down, Depressed, Hopeless 0 1 1 1 1   PHQ - 2 Score 1 1 4 2 2    Altered sleeping 0 - 0 0 0  Tired, decreased energy 1 - 1 2 2   Change in appetite 0 - 3 3 3   Feeling bad or failure about yourself  1 - 0 3 3  Trouble concentrating 0 - 0 3 3  Moving slowly or fidgety/restless 0 - 0 2 2  Suicidal thoughts 0 - 0 0 0  PHQ-9 Score 3 - 8 15 15   Difficult doing work/chores Not difficult at all - Somewhat difficult Somewhat difficult Somewhat difficult  Some recent data might be hidden    phq 9 is positive   Fall Risk: Fall Risk  02/14/2020 01/24/2020 11/11/2019 11/07/2019 10/09/2019  Falls in the past year? 0 0 0 0 0  Number falls in past yr: 0 - 0 0 0  Comment - - - - -  Injury with Fall? 0 - 0 0 0  Risk for fall due to : - Medication side effect - - -  Risk for fall due to: Comment - - - - -  Follow up Falls evaluation completed Falls prevention discussed - - -    Assessment & Plan  1. Lung involvement in systemic lupus erythematosus ([place])  She is under the care of pulmonologist, she has been using Flovent again  2. Empty sella [facility])  Last MRI brain was done at [facility] March 2021 and no longer has a pituitary adenoma, only empty sella  3. Benign hypertension  Fluctuating, she will try taking norvasc in pm and telmisartan in pm, she will return for bp check and if needed we will chang to norvasc bid  4. Dyslipidemia  Continue  medications  5. Chronic recurrent major depressive disorder ([place])  Not doing well, under the care of   6. Fibromyalgia  Trigger point positive   7. Diabetes mellitus type 2 in obese (HCC)  - POCT HgB A1C  8. Intertrigo  - clotrimazole-betamethasone (LOTRISONE) cream; Apply 1 application topically 2 (two) times daily.  Dispense: 45 g; Refill: 0

## 2020-02-19 ENCOUNTER — Telehealth: Payer: Self-pay

## 2020-02-24 ENCOUNTER — Encounter: Admit: 2020-02-24 | Discharge: 2020-02-25 | Payer: MEDICARE | Attending: Psychologist | Primary: Psychologist

## 2020-02-24 DIAGNOSIS — F4310 Post-traumatic stress disorder, unspecified: Principal | ICD-10-CM

## 2020-02-24 DIAGNOSIS — F411 Generalized anxiety disorder: Principal | ICD-10-CM

## 2020-02-24 DIAGNOSIS — F331 Major depressive disorder, recurrent, moderate: Principal | ICD-10-CM

## 2020-02-24 DIAGNOSIS — F410 Panic disorder [episodic paroxysmal anxiety] without agoraphobia: Principal | ICD-10-CM

## 2020-02-24 NOTE — Unmapped (Signed)
[facility]   Confidential Psychological Therapy Session      Patient Name: Caitlin Smith  Medical Record Number: [id number]  Date of Service: February 24, 2020  Attending Psychologist: Caroline More, PhD  CPT Procedure Codes: 81191 for 45 mins of face to face counseling    Due to the current declared state emergency during the coronavirus pandemic, all non-urgent medical and mental health visits have been triaged to either be delayed or take place virtually via phone or videoconferencing.   Due to the need for continued patient interaction for mental health care, pain management, and care coordination that necessitates the involvement of this provider, this visit was performed face to face using interactive technology using a HIPPA compliant audio/visual platform. This patient will be scheduled for face to face visits in the future once it has been deemed safe.      We reviewed confidentiality today. The patient was present at home (location and contact information confirmed), attended this visit alone, and consented to this virtual pain psychology visit.     Visit modifiers:   GT for Interactive Technology and CR for catastrophe/disaster related due to coronavirus pandemic    REFERRING PHYSICIAN: Clarene Essex, MD    CHIEF COMPLAINT AND REASON FOR VISIT: pain coping skills, CBT to address depression and anxiety in the setting of chronic pain    SUBJECTIVE / HISTORY OF PRESENT ILLNESS: Caitlin Smith is a very pleasant 64 y.o.  female from [place], [place] with multiple chronic pain complaints related to fibromyalgia and lupus who initially met with me in October 2016, and which time she was diagnosed with severe depression, PTSD, panic disorder, and generalized anxiety.The patient returns for a therapy session today. Last follow up with me was 01/29/20.    Patient returns today with improved mood.  States that since her last appointment she met with her psychiatrist again discussed the possibility of adding a mood stabilizer.  The patient was reluctant to add a new medication for fear of side effects, and noted she has put extra effort into utilizing cognitive behavioral coping skills instead.  Patient notes that the extra motivation seems to be helping.  She has been trying to avoid news programming related to COVID and systemic racism.  She noted that selective attention to these various hot topics has been helpful.  She has been using mindfulness skills.  She has continued to exercise.  She has been feeling more upbeat.  She is extremely appreciative today, expressing gratitude.  Use this is an opportunity to discuss how gratitude can help as focus on positive aspects of her lives even when there are also negative aspects ongoing.  Follow-up in 5 weeks.    OBJECTIVE / MENTAL STATUS:    Appearance:   Appears stated age and Clean/Neat   Motor:  No abnormal movements   Speech/Language:   Normal rate, volume, tone, fluency   Mood:  Depressed and Anxious   Affect:  blunted   Thought process:  Logical, linear, clear, coherent, goal directed   Thought content:    Denies SI, HI, self harm, delusions, obsessions, paranoid ideation, or ideas of reference   Perceptual disturbances:    Denies auditory and visual hallucinations, behavior not concerning for response to internal stimuli   Orientation:  Oriented to person, place, time, and general circumstances   Attention:  Able to fully attend without fluctuations in consciousness   Concentration:  Able to fully concentrate and attend   Memory:  Immediate, short-term, long-term, and recall grossly intact    Fund of knowledge:   Consistent with level of education and development   Insight:    Fair   Judgment:   Intact   Impulse Control:  Intact     DIAGNOSTIC IMPRESSION:   Post Traumatic Stress Disorder (PTSD)  Generalized Anxiety Disorder (GAD)  Panic Disorder  Major Depressive Disorder, moderate, recurrent  Chronic pain syndrome  Fibromyalgia  Lupus ASSESSMENT:   Caitlin Smith is a very pleasant 64 y.o.  female from [place], [place] with multiple chronic pain complaints related to lupus, arthritis, and fibromyalgia. She was previously seen in our clinic from 2012-2014, and reestablished care with Dr. Fayrene Fearing in August 2016. The patient is struggling with depression and anxiety, but is very motivated to participate in intensive, multidisciplinary treatment to address the connection between depression, anxiety and pain. She has a long-standing history of depression and anxiety, and has worked with outpatient psychiatrist and therapist over the years. She is currently established with [facility] psychiatry, and is tapering off of Klonopin.  Depression, anxiety, and panic have all improved.  Has been diagnosed with diabetes, and is managing it well with p.o. medication and dietary changes.  Had lost 50+ pounds but regained 20 lbs recently.  Patient presents with low-grade depression related to COVID and some social isolation.       PLAN:   (1) Psychotherapy - Continue CBT. CBT will be used to address PTSD, MDD, Panic D/o, GAD, and chronic pain.     --Behavioral Activation - Encouraged behavioral activation.  Is doing great, walking 5 days/week  --Downloaded and uses Insight Timer, guided relaxation app, for nightly practice.  --Continues to utilize diaphragmatic breathing daily    (2) Psychiatry - Pt currently established with [facility].  - to follow up with her psychiatrist and discuss low mood despite reduced stress and increased behavioral activation. To ask about possibility of adjusting mood stabilizer (Lamictal) versus other possible changes to help improve mood. (in psychotherapy will cont beh activation and identified need for increased socialization - this has changed post covid related school changes, is alone much more these days with her granddaughter back in school)    (3) Safety - Pt denies any current or recent SI or safety concerns. Knows to call 911 or go to her local ED. Also previously given [facility].      (4) follow-up - with me in 5 weeks

## 2020-02-25 DIAGNOSIS — M8949 Other hypertrophic osteoarthropathy, multiple sites: Principal | ICD-10-CM

## 2020-02-26 ENCOUNTER — Telehealth: Payer: Self-pay

## 2020-02-26 ENCOUNTER — Ambulatory Visit: Payer: Medicare Other

## 2020-02-26 ENCOUNTER — Ambulatory Visit: Payer: Self-pay

## 2020-02-26 ENCOUNTER — Other Ambulatory Visit: Payer: Self-pay

## 2020-02-26 VITALS — BP 136/90 | HR 89

## 2020-02-26 DIAGNOSIS — I10 Essential (primary) hypertension: Secondary | ICD-10-CM

## 2020-02-26 MED ORDER — DICLOFENAC 1 % TOPICAL GEL
5 refills | 0 days | Status: CP
Start: 2020-02-26 — End: ?

## 2020-02-26 NOTE — Chronic Care Management (AMB) (Signed)
°  Chronic Care Management   Outreach Note  02/26/2020 Name: Nicole Ballard MRN: [id number] DOB: 1956-06-10  Primary Care Provider: Steele Sizer, MD Reason for referral : Chronic Care Management   An unsuccessful telephone outreach was attempted today. [name] is currently engaged with the chronic care management team.   A HIPAA compliant voice message was left today requesting a return call.    Follow Up Plan The care management team will reach out to [name] again within the next two to three weeks.   [facility] [phone]

## 2020-02-26 NOTE — Progress Notes (Signed)
Patient is here for a blood pressure check. Patient denies chest pain, palpitations, shortness of breath or visual disturbances.*. Today during nurse visit first check blood pressure was 140/90. After resting for 10 minutes it was 136/92 and heart rate was 89. Third check was 136/90. She does take blood pressure medications.

## 2020-02-26 NOTE — Unmapped (Signed)
Previous office visit:02/18/20    Future appointment: None scheduled    Request is for diclofenac sodium (VOLTAREN) 1 % gel, #300 G    Apply 4 g topically Four (4) times a day    Please refill if appropriate

## 2020-02-26 NOTE — Unmapped (Signed)
Done. Patient has been schd     Thanks  [employer]

## 2020-02-27 ENCOUNTER — Encounter: Admit: 2020-02-27 | Discharge: 2020-02-28 | Payer: MEDICARE

## 2020-02-27 DIAGNOSIS — M25562 Pain in left knee: Secondary | ICD-10-CM | POA: Diagnosis not present

## 2020-02-27 DIAGNOSIS — M1712 Unilateral primary osteoarthritis, left knee: Secondary | ICD-10-CM | POA: Diagnosis not present

## 2020-02-27 DIAGNOSIS — M25561 Pain in right knee: Secondary | ICD-10-CM | POA: Diagnosis not present

## 2020-02-27 DIAGNOSIS — Z96651 Presence of right artificial knee joint: Secondary | ICD-10-CM | POA: Diagnosis not present

## 2020-02-27 MED ADMIN — triamcinolone acetonide (KENALOG-40) injection 40 mg: 40 mg | INTRA_ARTICULAR | @ 13:00:00 | Stop: 2020-02-27

## 2020-02-27 NOTE — Unmapped (Signed)
Patient: Caitlin Smith  Date of birth: 06-30-1956  Record number: [id number]  Date of clinic visit: 02/27/20  Primary care provider: Ruel Favors, MD      HISTORY OF PRESENT ILLNESS:  64yo F w/ significant PMH including SLE, HTN/HLD, depression, anxiety, PTSD who returns for continued evaluation of bilateral knee pain. She is s/p R TKA with Dr. Lanell Matar in 12/2013. Here today to discuss both knees.  We injected bilateral knees 6 months ago which provided immense pain relief up until several weeks ago.  She would like to discuss repeating injections.  Her right TKA continues to be stable with excellent range of motion I am not sure why she is having pain.  Pain is very vaguely distributed.  Despite the pain from her left knee osteoarthritis she is not interested in discussing TKA which I understand given her poor outcome after right TKA.      There were no vitals filed for this visit.    PHYSICAL EXAMINATION:    GENERAL: Appears their stated age, no acute distress, normal mood and affect, alert and oriented.      GAIT: non-antalgic  REFLEXES:              ANKLE: No clonus bilaterally  VASCULAR: 2+ DP pulse bilaterally  SENSATION:  SILT sural/saphenous/deep peroneal/superficial peroneal distributions  SKIN: No appreciable rashes, lesions, breakdown      RIGHT KNEE: Exam is limited by patient guarding  Midline incision is well healed. No erythema, drainage, or sinus tracts appreciated.  ROM:                            0?? - 119??    Ligament Stability   AP     <50mm                                     ML        mild lateral instability  Joint Line Tenderness:   Tender to palpation laterally and especially along lateral patellar facet  Joint Line Crepitus:   none  Patellar tracking: central  Crepitus: mild patellofemoral, severe TTP at lateral patella facet  Patella inhibition: mild  Extensor Lag: none  EFFUSION: none     LEFT KNEE:  ROM:                            0?? - 120??    Ligament Stability   AP     <73mm ML     <6 degrees   Joint Line Tenderness:   both medial and lateral  Joint Line Crepitus:        medial   Patellar tracking: central  Crepitus: mild patellofemoral  Patella inhibition: negative  Extensor Lag: none  EFFUSION: none      ASSESSMENT:  63yo F w/ SLE, CKD, HTN, HLD, depression, anxiety who is s/p R TKA with Dr. Lanell Matar in 12/2013. Here today for bilateral knee pain.     RECOMMENDATIONS:  Patient had exceptional pain relief from previous right TKA intra-articular steroid injection and left native knee intra-articular steroid injection.  We will repeat his injections today as requested by the patient which I agree with should continue conservative management.  Patient says that her right TKA pain is very similar to the pain she was having prior  to surgery.  I would defer proceeding with left TKA for her arthritis and the patient is in agreement with this.    Procedure note:  Bilateral knees were thoroughly prepped with chlorhexidine diluted in sterile normal saline.  Each knee was injected with a cocktail of 1 cc Kenalog (40 mg), 2 mL lidocaine, 2 mL Marcaine through the inferior lateral parapatellar portal.  The patient tolerated this procedure well.

## 2020-03-02 ENCOUNTER — Encounter
Admit: 2020-03-02 | Payer: MEDICARE | Attending: Student in an Organized Health Care Education/Training Program | Primary: Student in an Organized Health Care Education/Training Program

## 2020-03-03 NOTE — Unmapped (Signed)
Called patient to schd an appt could not LVM.Marland Kitchen VM was full .Marland Kitchen     Thanks  [employer]

## 2020-03-05 NOTE — Unmapped (Signed)
This encounter was created in error - please disregard.

## 2020-03-08 ENCOUNTER — Other Ambulatory Visit: Payer: Self-pay | Admitting: Family Medicine

## 2020-03-08 DIAGNOSIS — K219 Gastro-esophageal reflux disease without esophagitis: Secondary | ICD-10-CM

## 2020-03-08 NOTE — Telephone Encounter (Signed)
Requested Prescriptions  Pending Prescriptions Disp Refills   omeprazole (PRILOSEC) 40 MG capsule [Pharmacy Med Name: OMEPRAZOLE  40MG   CAP] 90 capsule 3    Sig: TAKE 1 CAPSULE BY MOUTH  DAILY     Gastroenterology: Proton Pump Inhibitors Passed - 03/08/2020  6:32 AM      Passed - Valid encounter within last 12 months    Recent Outpatient Visits          3 weeks ago Benign hypertension   [facility] Steele Sizer, MD   3 months ago Right ear pain   [facility] Steele Sizer, MD   4 months ago Right ear pain   [facility], Drue Stager, MD   5 months ago Diabetes mellitus type 2 in obese [facility])   [facility] Steele Sizer, MD   7 months ago Acute right-sided low back pain with right-sided sciatica   [facility] Steele Sizer, MD      Future Appointments            In 3 months Ancil Boozer, Drue Stager, MD [facility]

## 2020-03-09 MED ORDER — SUCRALFATE 1 GRAM TABLET: 0 days

## 2020-03-09 MED ORDER — SUCRALFATE 1 GRAM TABLET
0 days
Start: 2020-03-09 — End: ?

## 2020-03-15 ENCOUNTER — Encounter: Payer: Self-pay | Admitting: Family Medicine

## 2020-03-16 ENCOUNTER — Ambulatory Visit (INDEPENDENT_AMBULATORY_CARE_PROVIDER_SITE_OTHER): Payer: Medicare Other

## 2020-03-16 DIAGNOSIS — I10 Essential (primary) hypertension: Secondary | ICD-10-CM | POA: Diagnosis not present

## 2020-03-16 NOTE — Chronic Care Management (AMB) (Signed)
Chronic Care Management   Follow Up Note   03/16/2020 Name: JAWANNA DYKMAN MRN: [id number] DOB: 1956-05-07  Primary Care Provider: Steele Sizer, MD Reason for referral : Chronic Care Management   AMRITA RADU is a 64 y.o. year old female who is a primary care patient of Steele Sizer, MD. She is currently engaged with the chronic care management team. A routine telephonic outreach was conducted today.  Review of Ms. Saleeby's status, including review of consultants reports, relevant labs and test results was conducted today. Collaboration with appropriate care team members was performed as part of the comprehensive evaluation and provision of chronic care management services.    SDOH (Social Determinants of Health) assessments performed: No     Outpatient Encounter Medications as of 03/16/2020  Medication Sig Note  . amLODipine (NORVASC) 5 MG tablet Take 1 tablet (5 mg total) by mouth daily.   Marland Kitchen aspirin EC 81 MG tablet Take 1 tablet (81 mg total) by mouth daily.   Marland Kitchen atorvastatin (LIPITOR) 40 MG tablet TAKE 1 TABLET BY MOUTH  DAILY   . Biotin 1 MG CAPS Take 1 capsule by mouth daily.   Marland Kitchen buPROPion (WELLBUTRIN XL) 150 MG 24 hr tablet Take 150 mg by mouth every morning.   . Cholecalciferol (VITAMIN D) 2000 UNITS CAPS Take 1 capsule by mouth daily. 03/18/2019: Needs to pick up from pharmacy  . clonazePAM (KLONOPIN) 0.5 MG tablet Take 0.5 mg by mouth daily.   . clotrimazole-betamethasone (LOTRISONE) cream Apply 1 application topically 2 (two) times daily.   . cyclobenzaprine (FLEXERIL) 5 MG tablet TAKE 1 TABLET BY MOUTH  EVERY 8 HOURS AS NEEDED FOR MUSCLE SPASMS. DO NOT DRIVE FOR 8 HOURS (Patient not taking: Reported on 02/14/2020) 02/14/2020: prn  . diclofenac sodium (VOLTAREN) 1 % GEL APP 2 GRAMS EXT AA QID 04/28/2015: Received from: External Pharmacy  . DULoxetine (CYMBALTA) 60 MG capsule Take 120 mg by mouth at bedtime.   . fluticasone (FLONASE) 50 MCG/ACT nasal spray Place 2  sprays into both nostrils daily.   . halobetasol (ULTRAVATE) 0.05 % ointment Apply 1 each topically daily.   . hydroxychloroquine (PLAQUENIL) 200 MG tablet Take by mouth 2 (two) times daily.   Marland Kitchen lamoTRIgine (LAMICTAL) 200 MG tablet Take 200 mg by mouth 2 (two) times daily.   . meclizine (ANTIVERT) 25 MG tablet Take 1 tablet (25 mg total) by mouth 3 (three) times daily as needed. 02/14/2020: prn  . Melatonin 10 MG TABS Take 1 tablet by mouth at bedtime.   . mycophenolate (MYFORTIC) 360 MG TBEC EC tablet Take 360 mg by mouth 2 (two) times daily.   Marland Kitchen olopatadine (PATANOL) 0.1 % ophthalmic solution Place 1 drop into both eyes 2 (two) times daily.   Marland Kitchen omeprazole (PRILOSEC) 40 MG capsule TAKE 1 CAPSULE BY MOUTH  DAILY   . ondansetron (ZOFRAN) 4 MG tablet TAKE 1 TABLET BY MOUTH  EVERY 8 HOURS AS NEEDED FOR NAUSEA AND VOMITING 02/14/2020: prn  . Polyethylene Glycol 3350 (MIRALAX PO) Take by mouth. 02/14/2020: prn  . telmisartan (MICARDIS) 80 MG tablet Take 1 tablet (80 mg total) by mouth daily.   . traZODone (DESYREL) 100 MG tablet Take 150 mg by mouth at bedtime.   . triamcinolone ointment (KENALOG) 0.1 %  02/14/2020: prn   No facility-administered encounter medications on file as of 03/16/2020.      Objective:  BP Readings from Last 3 Encounters:  02/26/20 136/90  02/14/20 136/72  11/11/19 133/85  Goals Addressed            This Visit's Progress   . Chronic Disease Management   On track    CARE PLAN ENTRY (see longitudinal plan of care for additional care plan information)  Current Barriers:  . Chronic Disease Management support and education needs related to Hypertension, Asthma, GERD, Chronic Kidney Disease, Dyslipidemia and Systemic lupus erythematosus (lung involvement)  Nurse Case Manager Clinical Goal(s):  Marland Kitchen Over the next 90 days, patient will not require hospitalization or emergent evaluation d/t chronic illnesses. . Over the next 90 days, patient will attend all medical  appointments. . Over the next 90 days, patient will take all medications as prescribed. . Over the next 90 days, patient will weigh at least once a week and record readings. . Over the next 90 days, patient will monitor blood pressure daily and record readings. . Over the next 90 days, patient will follow recommended safety measures to prevent falls and injuries. . Over the next 10 days, patient will work with Care Guides to discuss options for transportation assistance.  Interventions:  . Inter-disciplinary care team collaboration (see longitudinal plan of care) . Reviewed medications. Reports taking as prescribed. Continues to experience excessive sweating during and after exercise sessions. Reports purchasing OTC antifungal products as recommended by provider. Denies current concerns regarding prescription cost. . Discussed recent BP ranges. Encouraged to continue monitoring daily and recording readings to identify trends. Reports BP readings continues to fluctuate. Denies extreme highs or lows. Reports recent reading of 139/79. Reports an episode of mild chest discomfort within the past few weeks that eventually resolved. No shortness of breath. Denies symptoms at time of call. Reviewed worsening s/sx and indications for seeking urgent medical attention. . Reviewed pending appointments. Encouraged to keep care management team informed of transportation needs. . Discussed plan for ongoing care management and follow-up. Ms. Reeser reports doing well aside from BP fluctuations.  Currently able to walk on the treadmill approximately 45 min/day without difficulty. Reports significant improvements with diet and nutritional intake. She is very motivated to meet her nutritional and  weight loss goals and improve her overall health.  Patient Self Care Activities:  . Self administers medications  . Attends scheduled provider appointments . Calls pharmacy for medication refills . Attends church or other  social activities . Performs ADL's independently . Performs IADL's independently . Calls provider office for new concerns or questions   Please see past updates related to this goal by clicking on the "Past Updates" button in the selected goal         PLAN The care management team will follow-up with Ms. Evans next month.    [facility] [phone]

## 2020-03-23 NOTE — Patient Instructions (Signed)
Thank you for allowing the Chronic Care Management team to participate in your care.  Goals Addressed            This Visit's Progress    Chronic Disease Management   On track    CARE PLAN ENTRY (see longitudinal plan of care for additional care plan information)  Current Barriers:   Chronic Disease Management support and education needs related to Hypertension, Asthma, GERD, Chronic Kidney Disease, Dyslipidemia and Systemic lupus erythematosus (lung involvement)  Nurse Case Manager Clinical Goal(s):   Over the next 90 days, patient will not require hospitalization or emergent evaluation d/t chronic illnesses.  Over the next 90 days, patient will attend all medical appointments.  Over the next 90 days, patient will take all medications as prescribed.  Over the next 90 days, patient will weigh at least once a week and record readings.  Over the next 90 days, patient will monitor blood pressure daily and record readings.  Over the next 90 days, patient will follow recommended safety measures to prevent falls and injuries.  Over the next 10 days, patient will work with Care Guides to discuss options for transportation assistance.  Interventions:   Inter-disciplinary care team collaboration (see longitudinal plan of care)  Reviewed medications. Reports taking as prescribed. Continues to experience excessive sweating during and after exercise sessions. Reports purchasing OTC antifungal products as recommended by provider. Denies current concerns regarding prescription cost.  Discussed recent BP ranges. Encouraged to continue monitoring daily and recording readings to identify trends. Reports BP readings continues to fluctuate. Denies extreme highs or lows. Reports recent reading of 139/79. Reports an episode of mild chest discomfort within the past few weeks that eventually resolved. No shortness of breath. Denies symptoms at time of call. Reviewed worsening s/sx and indications for  seeking urgent medical attention.  Reviewed pending appointments. Encouraged to keep care management team informed of transportation needs.  Discussed plan for ongoing care management and follow-up. Ms. Rarick reports doing well aside from BP fluctuations.  Currently able to walk on the treadmill approximately 45 min/day without difficulty. Reports significant improvements with diet and nutritional intake. She is very motivated to meet her nutritional and  weight loss goals and improve her overall health.  Patient Self Care Activities:   Self administers medications   Attends scheduled provider appointments  Calls pharmacy for medication refills  Attends church or other social activities  Performs ADL's independently  Performs IADL's independently  Calls provider office for new concerns or questions   Please see past updates related to this goal by clicking on the "Past Updates" button in the selected goal         Ms. Piney verbalized understanding of the information discussed during the telephonic outreach today. Declined need for a mailed/printed copy of the instructions.   The care management team will follow-up with Ms. Tracyton next month.   [facility] [phone]

## 2020-03-23 NOTE — Unmapped (Signed)
Return call after MyChart message. Patient states having vaginal itching. Advised patient to try OPTC medications, if no help , contact us and make an appointment. Patient verbalizes understanding

## 2020-03-24 NOTE — Unmapped (Signed)
Patient scheduled.  Thanks

## 2020-03-25 NOTE — Unmapped (Signed)
Spoke with [name] on the phone this morning as she reported in [place] message she was feeling more depressed. She reports not getting good sleep x2 nights about a week ago. Her car was recently repaired, as of one week ago.She endorses feeling down more in the mornings. She also states that she will wake up with an anxious feeling more often. Discussed strategies to mitigate this and asked her to write thoughts down so that we can think about them together when she visits, or so that she can share them with her therapist too.  Expressed hopefulness that increased independence with functional car will also help her. [name] reports an increase in pain/fibromyalgia. She sees rheumatologist 29th and pain doctor august 1st. She denies SI and contracts for safety.     She asks if she can try increasing bupropion. Agreed to try this and asked her to report back if she experiences s/e.     Plan:     1) Encouraged [name] to write down thoughts that occur when feeling down  2) Increase bupropion to 300mg  qam   --In future, would like to work with [name] to decrease polypharmacy and pill burden. Have been relatively unsuccessful with decrease in bupropion, trazodone. Could try to decrease lamictal somewhat as she is well above 200mg  dose usually used to treat depressive symptoms. Will consider at next visit, although she expresses no concerns about medications right now.

## 2020-03-30 NOTE — Unmapped (Signed)
[place],    Patient Caitlin Smith contacted the Communication Center to cancel their appointment for 04/02/2020.  The appointment has been cancelled.    Cancellation Reason: Transportation. Rescheduled for 04/06/2020.    Thank you,  Jacques Navy  [facility]   [phone]

## 2020-03-31 ENCOUNTER — Institutional Professional Consult (permissible substitution): Admit: 2020-03-31 | Discharge: 2020-04-01 | Payer: MEDICARE | Attending: Psychologist | Primary: Psychologist

## 2020-03-31 DIAGNOSIS — F4310 Post-traumatic stress disorder, unspecified: Principal | ICD-10-CM

## 2020-03-31 DIAGNOSIS — F331 Major depressive disorder, recurrent, moderate: Principal | ICD-10-CM

## 2020-03-31 DIAGNOSIS — F410 Panic disorder [episodic paroxysmal anxiety] without agoraphobia: Principal | ICD-10-CM

## 2020-03-31 DIAGNOSIS — F411 Generalized anxiety disorder: Principal | ICD-10-CM

## 2020-03-31 NOTE — Unmapped (Signed)
[facility]   Confidential Psychological Therapy Session      Patient Name: Caitlin Smith  Medical Record Number: [id number]  Date of Service: March 31, 2020  Attending Psychologist: Caroline More, PhD  CPT Procedure Codes: 56213 for 60 mins of face to face counseling    Due to the current declared state emergency during the coronavirus pandemic, all non-urgent medical and mental health visits have been triaged to either be delayed or take place virtually via phone or videoconferencing.   Due to the need for continued patient interaction for mental health care, pain management, and care coordination that necessitates the involvement of this provider, this visit was performed face to face using interactive technology using a HIPPA compliant audio/visual platform. This patient will be scheduled for face to face visits in the future once it has been deemed safe.      We reviewed confidentiality today. The patient was present at home (location and contact information confirmed), attended this visit alone, and consented to this virtual pain psychology visit.     Visit modifiers:   GT for Interactive Technology and CR for catastrophe/disaster related due to coronavirus pandemic    REFERRING PHYSICIAN: Clarene Essex, MD    CHIEF COMPLAINT AND REASON FOR VISIT: pain coping skills, CBT to address depression and anxiety in the setting of chronic pain    SUBJECTIVE / HISTORY OF PRESENT ILLNESS: [name] is a very pleasant 64 y.o.  female from [place], [place] with multiple chronic pain complaints related to fibromyalgia and lupus who initially met with me in October 2016, and which time she was diagnosed with severe depression, PTSD, panic disorder, and generalized anxiety.The patient returns for a therapy session today. Last follow up with me was 02/24/20.    Patient participates actively throughout her therapy session today.  In general she is doing well.  Pain has been well managed.  She has been healthy and her family has been healthy as well.  She was previously very upset about systemic racism but is starting to feel like positive changes occurring.  She reflects on a sense of feeling unsafe and vulnerable and how that triggered PTSD.  She processes thoughts and feelings related to the coronavirus pandemic and increase and delta variant cases.  Spent extra time problem solving how to approach risk assessment related to getting out for her needs and social activities while remaining safe.    OBJECTIVE / MENTAL STATUS:    Appearance:   Appears stated age and Clean/Neat   Motor:  No abnormal movements   Speech/Language:   Normal rate, volume, tone, fluency   Mood:  Depressed and Anxious   Affect:  blunted   Thought process:  Logical, linear, clear, coherent, goal directed   Thought content:    Denies SI, HI, self harm, delusions, obsessions, paranoid ideation, or ideas of reference   Perceptual disturbances:    Denies auditory and visual hallucinations, behavior not concerning for response to internal stimuli   Orientation:  Oriented to person, place, time, and general circumstances   Attention:  Able to fully attend without fluctuations in consciousness   Concentration:  Able to fully concentrate and attend   Memory:  Immediate, short-term, long-term, and recall grossly intact    Fund of knowledge:   Consistent with level of education and development   Insight:    Fair   Judgment:   Intact   Impulse Control:  Intact     DIAGNOSTIC IMPRESSION:  Post Traumatic Stress Disorder (PTSD)  Generalized Anxiety Disorder (GAD)  Panic Disorder  Major Depressive Disorder, moderate, recurrent  Chronic pain syndrome  Fibromyalgia  Lupus    ASSESSMENT:   [name] is a very pleasant 64 y.o.  female from [place], [place] with multiple chronic pain complaints related to lupus, arthritis, and fibromyalgia. She was previously seen in our clinic from 2012-2014, and reestablished care with Dr. Fayrene Fearing in August 2016. The patient is struggling with depression and anxiety, but is very motivated to participate in intensive, multidisciplinary treatment to address the connection between depression, anxiety and pain. She has a long-standing history of depression and anxiety, and has worked with outpatient psychiatrist and therapist over the years. She is currently established with [facility] psychiatry, and is tapering off of Klonopin.  Depression, anxiety, and panic have all improved.  Has been diagnosed with diabetes, and is managing it well with p.o. medication and dietary changes.  Had lost 50+ pounds but regained 20 lbs recently.  Patient presents with low-grade depression related to COVID and some social isolation.       PLAN:   (1) Psychotherapy - Continue CBT. CBT will be used to address PTSD, MDD, Panic D/o, GAD, and chronic pain.     --Behavioral Activation - Encouraged behavioral activation.  Is doing great, walking 5 days/week  --Downloaded and uses Insight Timer, guided relaxation app, for nightly practice.  --Continues to utilize diaphragmatic breathing daily    (2) Psychiatry - Pt currently established with [facility].  - to follow up with her psychiatrist and discuss low mood despite reduced stress and increased behavioral activation. To ask about possibility of adjusting mood stabilizer (Lamictal) versus other possible changes to help improve mood. (in psychotherapy will cont beh activation and identified need for increased socialization - this has changed post covid related school changes, is alone much more these days with her granddaughter back in school)    (3) Safety - Pt denies any current or recent SI or safety concerns. Knows to call 911 or go to her local ED. Also previously given [facility].      (4) follow-up - with me in 5 weeks

## 2020-04-02 ENCOUNTER — Encounter: Admit: 2020-04-02 | Discharge: 2020-04-03 | Payer: MEDICARE

## 2020-04-02 ENCOUNTER — Ambulatory Visit: Admit: 2020-04-02 | Discharge: 2020-04-03 | Payer: MEDICARE

## 2020-04-02 DIAGNOSIS — I129 Hypertensive chronic kidney disease with stage 1 through stage 4 chronic kidney disease, or unspecified chronic kidney disease: Secondary | ICD-10-CM | POA: Diagnosis not present

## 2020-04-02 DIAGNOSIS — M25512 Pain in left shoulder: Secondary | ICD-10-CM | POA: Diagnosis not present

## 2020-04-02 DIAGNOSIS — N181 Chronic kidney disease, stage 1: Secondary | ICD-10-CM | POA: Diagnosis not present

## 2020-04-02 DIAGNOSIS — J45909 Unspecified asthma, uncomplicated: Secondary | ICD-10-CM | POA: Diagnosis not present

## 2020-04-02 DIAGNOSIS — M79661 Pain in right lower leg: Secondary | ICD-10-CM | POA: Diagnosis not present

## 2020-04-02 DIAGNOSIS — M79662 Pain in left lower leg: Secondary | ICD-10-CM | POA: Diagnosis not present

## 2020-04-02 DIAGNOSIS — L409 Psoriasis, unspecified: Secondary | ICD-10-CM | POA: Diagnosis not present

## 2020-04-02 DIAGNOSIS — Z79899 Other long term (current) drug therapy: Secondary | ICD-10-CM | POA: Diagnosis not present

## 2020-04-02 DIAGNOSIS — M797 Fibromyalgia: Secondary | ICD-10-CM | POA: Diagnosis not present

## 2020-04-02 DIAGNOSIS — E559 Vitamin D deficiency, unspecified: Secondary | ICD-10-CM | POA: Diagnosis not present

## 2020-04-02 DIAGNOSIS — Z96651 Presence of right artificial knee joint: Secondary | ICD-10-CM | POA: Diagnosis not present

## 2020-04-02 DIAGNOSIS — Z7982 Long term (current) use of aspirin: Secondary | ICD-10-CM | POA: Diagnosis not present

## 2020-04-02 DIAGNOSIS — J8489 Other specified interstitial pulmonary diseases: Secondary | ICD-10-CM | POA: Diagnosis not present

## 2020-04-02 DIAGNOSIS — M25562 Pain in left knee: Secondary | ICD-10-CM | POA: Diagnosis not present

## 2020-04-02 DIAGNOSIS — M329 Systemic lupus erythematosus, unspecified: Secondary | ICD-10-CM | POA: Diagnosis not present

## 2020-04-02 DIAGNOSIS — Z880 Allergy status to penicillin: Secondary | ICD-10-CM | POA: Diagnosis not present

## 2020-04-02 DIAGNOSIS — M25511 Pain in right shoulder: Secondary | ICD-10-CM | POA: Diagnosis not present

## 2020-04-02 DIAGNOSIS — M545 Low back pain: Secondary | ICD-10-CM | POA: Diagnosis not present

## 2020-04-02 DIAGNOSIS — M25561 Pain in right knee: Secondary | ICD-10-CM | POA: Diagnosis not present

## 2020-04-02 DIAGNOSIS — M3219 Other organ or system involvement in systemic lupus erythematosus: Secondary | ICD-10-CM | POA: Diagnosis not present

## 2020-04-02 LAB — COLOR

## 2020-04-02 LAB — CBC W/ AUTO DIFF
BASOPHILS ABSOLUTE COUNT: 0 10*9/L (ref 0.0–0.1)
BASOPHILS RELATIVE PERCENT: 0.9 %
EOSINOPHILS RELATIVE PERCENT: 3.2 %
HEMATOCRIT: 40.9 % (ref 35.0–44.0)
LYMPHOCYTES ABSOLUTE COUNT: 1.5 10*9/L (ref 0.7–4.0)
LYMPHOCYTES RELATIVE PERCENT: 34.1 %
MEAN CORPUSCULAR HEMOGLOBIN CONC: 32.3 g/dL (ref 30.0–36.0)
MEAN CORPUSCULAR HEMOGLOBIN: 28.2 pg (ref 26.0–34.0)
MEAN CORPUSCULAR VOLUME: 87.2 fL (ref 82.0–98.0)
MONOCYTES ABSOLUTE COUNT: 0.5 10*9/L (ref 0.1–1.0)
MONOCYTES RELATIVE PERCENT: 11.8 %
NEUTROPHILS ABSOLUTE COUNT: 2.3 10*9/L (ref 1.7–7.7)
NEUTROPHILS RELATIVE PERCENT: 50 %
NUCLEATED RED BLOOD CELLS: 0 /100{WBCs} (ref <=4)
PLATELET COUNT: 334 10*9/L (ref 150–450)
RED BLOOD CELL COUNT: 4.69 10*12/L (ref 3.90–5.03)
RED CELL DISTRIBUTION WIDTH: 14.7 % (ref 12.0–15.0)
WBC ADJUSTED: 4.5 10*9/L (ref 3.5–10.5)

## 2020-04-02 LAB — URINALYSIS WITH CULTURE REFLEX
BILIRUBIN UA: NEGATIVE
BLOOD UA: NEGATIVE
GLUCOSE UA: NEGATIVE
HYALINE CASTS: <1 /LPF — ABNORMAL HIGH (ref <=0)
KETONES UA: NEGATIVE
NITRITE UA: NEGATIVE
PH UA: 6 (ref 5.0–9.0)
SPECIFIC GRAVITY UA: >=1.03 (ref 1.005–1.030)
SQUAMOUS EPITHELIAL: 5 /HPF (ref 0–5)
UROBILINOGEN UA: 0.2
WBC UA: 15 /HPF — ABNORMAL HIGH (ref 0–3)

## 2020-04-02 LAB — AST (SGOT): Aspartate aminotransferase:CCnc:Pt:Ser/Plas:Qn:: 24

## 2020-04-02 LAB — C3 COMPLEMENT: Complement C3:MCnc:Pt:Ser/Plas:Qn:: 122

## 2020-04-02 LAB — EOSINOPHILS RELATIVE PERCENT: Eosinophils/100 leukocytes:NFr:Pt:Bld:Qn:Automated count: 3.2

## 2020-04-02 LAB — C4 COMPLEMENT: Complement C4:MCnc:Pt:Ser/Plas:Qn:: 20.5

## 2020-04-02 LAB — PROTEIN / CREATININE RATIO, URINE
PROTEIN URINE: 14.5 mg/dL
PROTEIN/CREAT RATIO, URINE: 0.117

## 2020-04-02 LAB — EGFR CKD-EPI NON-AA FEMALE
Glomerular filtration rate/1.73 sq M.predicted.non black:ArVRat:Pt:Ser/Plas/Bld:Qn:Creatinine-based formula (CKD-EPI): 69

## 2020-04-02 LAB — CREATININE: CREATININE: 0.9 mg/dL — ABNORMAL HIGH

## 2020-04-02 LAB — ALT (SGPT): Alanine aminotransferase:CCnc:Pt:Ser/Plas:Qn:: 23

## 2020-04-02 LAB — PROTEIN URINE: Protein:MCnc:Pt:Urine:Qn:: 14.5

## 2020-04-02 MED ORDER — HYDROXYCHLOROQUINE 200 MG TABLET
ORAL_TABLET | Freq: Two times a day (BID) | ORAL | 3 refills | 90 days | Status: CP
Start: 2020-04-02 — End: ?

## 2020-04-02 MED ORDER — MYCOPHENOLATE SODIUM 360 MG TABLET,DELAYED RELEASE
ORAL_TABLET | Freq: Two times a day (BID) | ORAL | 3 refills | 90.00000 days | Status: CP
Start: 2020-04-02 — End: 2020-04-02
  Filled 2020-05-05: qty 180, 90d supply, fill #0

## 2020-04-02 MED ORDER — MYCOPHENOLATE SODIUM 360 MG TABLET,DELAYED RELEASE: 360 mg | tablet | Freq: Two times a day (BID) | 3 refills | 90 days | Status: AC

## 2020-04-02 NOTE — Unmapped (Signed)
Addended by: Alfredo Batty on: 04/02/2020 09:37 AM     Modules accepted: Orders

## 2020-04-02 NOTE — Unmapped (Addendum)
No change in medication needed today.    If needing systemic therapy for psoriasis, we can consider Orencia (abatacept) but I don't see a need for it today.    We will check labs today to monitor disease activity and medications.    Fibromyalgia is active today. Try to get some physical activity in each day including yoga and stretches. I know you want to get the gym but you will need a gentle start.

## 2020-04-02 NOTE — Unmapped (Signed)
Patient Name: Caitlin Smith  PCP: ??Ruel Favors, MD  Source of History: patient and records  Date of Visit: 04/02/20 8:42 AM    Chief Compliant: Follow-up for SLE and COP    PRIOR RHEUMATOLOGIC HISTORY:?? Initially diagnosed in 2006 with SLE presenting with episcleritis and arthralgia with serology showing +ANA, +dsDNA. ??She also has stage I CKD felt secondary to be due to HTN and obesity but not SLE. She had been previously followed by Dr. Hortencia Conradi who retired. Patient was evaluated by myself for the first time in September 2015. 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 She has had difficulty tapering off prednisone for her pulmonary process and??placed??on mycophenolate mofetil as a steroid sparing agent. She is being evaluated in Psychiatry for cognition issues and depression. Patient has been having confusion with her medications and??has been followed by??our clinical pharmacist to clarify meds. At??follow up??with??myself in May 2019, switched from Cellcept to Myfortic due to GI side effects. At follow-up in September??2019??, noted to have a new rash and referred to??Dermatology.??Evaluation by??Dermatology??in 07/2018 showed inverse??psoriasis??and recommended topical treatment and avoidance of??prednisone.??Otherwise,??no changes were made to her medication regimen.    HPI: Caitlin Smith is a 64 y.o. female who presents for her follow-up for SLE and organizing pneumonia. She was last seen by me in person in January 2020. Last completed a televisit with me by phone in May 2021. She presents in person today.     Today, patient reports that she is doing okay except for having lots of pain in her lower back, shoulders and knees. She is getting injections in her knees with Dr. Daiva Huge. She is s/p Rt TKR and is not interested in Lt TKR. She is complaining of pain in her shins. She reports compliance with hydroxychloroquine 200 mg po bid and Myfortic 360 mg po bid. She report psoriasis is active on her back and buttocks. She is applying topicals. She reports her breathing as stable. She denies any joint swelling or stiffness. We briefly discussed Orencia as an option to treat her psoriasis and would not be an issue for her lupus or lungs. She plans to see Dermatology in September and see if more systemic therapy is needed for the psoriasis. Last eye exam was in October 2020.  Last bone density in 2019 was normal. Discussed waiting another year to 2022 as prior was normal and she is no longer on long term steroid.  She reports completing the Moderna COVID-19 vaccine x 2 but did not bring her vaccination card and cannot recall her dates for me to update the system.     ROS??: Attests to the above, otherwise, review of all other systems is negative.   ?? ????  ??  Past Medical and Surgical History:  ??  Patient Active Problem List    Diagnosis Date Noted   ??? Appointment canceled by hospital 01/22/2020   ??? Otalgia 12/03/2019   ??? Hyperglycemia 10/23/2017   ??? Bronchiolitis obliterans organizing pneumonia (CMS-HCC) 09/29/2016   ??? Vertigo 09/29/2016   ??? Neck pain 12/15/2015   ??? Neck pain, musculoskeletal 12/15/2015   ??? Fibromyalgia 10/12/2015   ??? Anxiety about health 09/29/2015   ??? Abnormal computed tomography scan 04/11/2015   ??? Abnormal presence of protein in urine 04/11/2015   ??? Urinary incontinence 04/11/2015   ??? Hearing difficulty 04/11/2015   ??? Chronic constipation 04/11/2015   ???  Chronic kidney disease (CKD), stage I 04/11/2015   ??? Chronic pain not due to malignancy 04/11/2015   ??? Chronic recurrent major depressive disorder (CMS-HCC) 04/11/2015   ??? Dyslipidemia 04/11/2015   ??? Obesity, diabetes, and hypertension syndrome (CMS-HCC) 04/11/2015   ??? Intertrigo 04/11/2015   ??? Fibrositis 04/11/2015   ??? Gastroesophageal reflux disease without esophagitis 04/11/2015   ??? Vitamin D deficiency 04/11/2015 ??? Seborrhea capitis 04/11/2015   ??? Perennial allergic rhinitis with seasonal variation 04/11/2015   ??? Moderate dysplasia of vulva 04/11/2015   ??? Spinal stenosis of lumbar region 04/11/2015   ??? Low back pain 04/11/2015   ??? Keratosis pilaris 04/11/2015   ??? Insomnia, persistent 04/11/2015   ??? Abnormal CAT scan 04/11/2015   ??? Absence of bladder continence 04/11/2015   ??? Auditory impairment 04/11/2015   ??? Chronic nonmalignant pain 04/11/2015   ??? Dysmetabolic syndrome 04/11/2015   ??? Eczema intertrigo 04/11/2015   ??? Gastro-esophageal reflux disease without esophagitis 04/11/2015   ??? Neurogenic claudication 04/11/2015   ??? Screening for cardiovascular condition 03/05/2015   ??? Encounter for screening for cardiovascular disorders 03/05/2015   ??? Treadmill stress test negative for angina pectoris 03/05/2015   ??? Nausea 10/30/2014   ??? Cough 05/20/2014   ??? Shortness of breath 05/20/2014   ??? Regurgitation 04/24/2014   ??? Epigastric burning sensation 04/24/2014   ??? Obesity (BMI 30-39.9) 02/03/2014   ??? Obesity with body mass index 30 or greater 02/03/2014   ??? Morbid obesity (CMS-HCC) 02/03/2014   ??? S/P total knee replacement 12/31/2013   ??? Asthma 12/31/2013   ??? History of total knee replacement 12/31/2013   ??? Asthma, chronic 12/31/2013   ??? H/O total knee replacement 12/31/2013   ??? SLE (systemic lupus erythematosus) (CMS-HCC) 11/28/2013   ??? Episcleritis 09/26/2013   ??? Constipation 07/19/2013   ??? Pre-op evaluation 07/10/2013   ??? Other diseases of vocal cords 05/28/2013   ??? Vocal cord dysfunction 05/28/2013   ??? Generalized anxiety disorder 03/19/2013   ??? Pain medication agreement signed 02/12/2013   ??? Family history of breast cancer    ??? 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 Backache 12/18/2012   ??? Back pain 12/18/2012   ??? Nonspecific abnormal finding of lung field 11/26/2012   ??? Multiple pulmonary nodules 11/26/2012   ??? Lung nodule, multiple 11/26/2012   ??? Pulmonary nodules 11/26/2012   ??? Osteoarthrosis 11/01/2012 ??? Osteoarthritis 11/01/2012   ??? Arthritis, degenerative 11/01/2012   ??? Postinflammatory pulmonary fibrosis (CMS-HCC) 10/22/2012   ??? Aspiration pneumonitis (CMS-HCC) 10/22/2012   ??? History of respiratory system disease 10/22/2012   ??? H/O aspiration pneumonitis 10/22/2012   ??? Mixed urge and stress incontinence 05/04/2012   ??? Mixed incontinence 05/04/2012   ??? Dysphonia 03/22/2012   ??? Sensorineural hearing loss 03/22/2012   ??? Chronic pain syndrome 05/19/2011   ??? Myalgia and myositis 02/25/2011   ??? Benign neoplasm of pituitary gland and craniopharyngeal duct (pouch) (CMS-HCC) 12/29/2010   ??? Benign neoplasm of pituitary gland and craniopharyngeal duct (CMS-HCC) 12/29/2010   ??? Chronic kidney disease, stage I 12/08/2010   ??? Proteinuria 12/08/2010   ??? Depressive disorder 11/05/2010   ??? Lupus erythematosus 11/05/2010   ??? Inflammatory autoimmune disorder (CMS-HCC) 11/05/2010   ??? Systemic lupus erythematosus (CMS-HCC) 11/05/2010   ??? Lung involvement in systemic lupus erythematosus (CMS-HCC) 11/05/2010   ??? Chronic nausea 07/01/2008   ??? Hypertension, benign 01/25/2005   ??? Benign hypertension 01/25/2005     Past Surgical History:  Procedure Laterality Date   ??? BRAIN SURGERY      for facial spasms   ??? BREAST BIOPSY Right 2012    Needle bx   ??? GALLBLADDER SURGERY     ??? HYSTERECTOMY N/A 07/09/2001    Vaginal Hysterectomy with ovaries in place   ??? JOINT REPLACEMENT Right     3 TO 4 YEARS AGO   ??? PR ANAL PRESSURE RECORD N/A 03/22/2016    Procedure: ANORECTAL MANOMETRY;  Surgeon: Nurse-Based Giproc;  [facility];  Service: Gastroenterology   ??? PR BRONCHOSCOPY,DIAGNOSTIC W LAVAGE Bilateral 04/25/2016    Procedure: BRONCHOSCOPY, RIGID OR FLEXIBLE, INCLUDE FLUOROSCOPIC GUIDANCE WHEN PERFORMED; W/BRONCHIAL ALVEOLAR LAVAGE WITH MODERATE SEDATION;  Surgeon: Mercy Moore, MD;  Location: BRONCH PROCEDURE LAB [facility];  Service: Pulmonary   ??? PR BRONCHOSCOPY,TRANSBRONCH BIOPSY N/A 04/25/2016    Procedure: BRONCHOSCOPY, RIGID/FLEXIBLE, INCLUDE FLUORO GUIDANCE WHEN PERFORMED; W/TRANSBRONCHIAL LUNG BX, SINGLE LOBE WITH MODERATE SEDATION;  Surgeon: Mercy Moore, MD;  Location: BRONCH PROCEDURE LAB [facility];  Service: Pulmonary   ??? PR COLON CA SCRN NOT HI RSK IND  08/22/2013    Procedure: COLOREC CNCR SCR;COLNSCPY NO;  Surgeon: Brown Human, MD;  Location: [facility];  Service: Gastroenterology   ??? PR GERD TST W/ MUCOS IMPEDE ELECTROD,>1HR N/A 05/26/2014    Procedure: ESOPHAGEAL FUNCTION TEST, GASTROESOPHAGEAL REFLUX TEST W/ NASAL CATHETER INTRALUMINAL IMPEDANCE ELECTRODE(S) PLACEMENT, RECORDING, ANALYSIS AND INTERPRETATION; PROLONGED;  Surgeon: Nurse-Based Giproc;  Location: [facility];  Service: Gastroenterology   ??? PR TOTAL KNEE ARTHROPLASTY Right 12/31/2013    Procedure: ARTHROPLASTY, KNEE, CONDYLE & PLATEAU; MEDIAL & LAT COMPARTMENT W/WO PATELLA RESURFACE (TOTAL KNEE ARTHROP);  Surgeon: Aram Beecham, MD;  Location: MAIN OR [facility];  Service: Orthopedics   ??? PR UPPER GI ENDOSCOPY,BIOPSY N/A 11/07/2014    Procedure: UGI ENDOSCOPY; WITH BIOPSY, SINGLE OR MULTIPLE;  Surgeon: Trula Slade, MD;  Location: [facility];  Service: Gastroenterology   ??? SKIN BIOPSY     ??? wide local excision of vulva Right      Allergies:   ??  Allergies   Allergen Reactions   ??? Vilazodone Swelling   ??? Ace Inhibitors Other (See Comments)     Pt states she can not take ace inhibitors. Pt states she can not remember her reaction.    ??? Bee Pollen Itching     COUGH, WATERY EYES   ??? Penicillin G Rash   ??? Penicillins Rash   ??? Pollen Extracts Itching     COUGHING, WATERY EYES     Current Outpatient Medications:  ??  Current Outpatient Medications on File Prior to Visit   Medication Sig Dispense Refill   ??? amLODIPine (NORVASC) 5 MG tablet Take 1 tablet (5 mg total) by mouth daily. 90 tablet 3   ??? aspirin (ECOTRIN) 81 MG tablet Take 81 mg by mouth daily.     ??? atorvastatin (LIPITOR) 40 MG tablet Take 40 mg by mouth daily.     ??? biotin 1 mg cap Take by mouth.     ??? buPROPion (WELLBUTRIN XL) 150 MG 24 hr tablet Take 1 tablet (150 mg total) by mouth every morning. 90 tablet 2   ??? calcipotriene (DOVONOX) 0.005 % ointment Apply every other day to areas of rash in folds as maintenance 60 g 2   ??? clobetasoL (TEMOVATE) 0.05 % ointment Apply BID to all affected area 60 g 5   ??? clonazePAM (KLONOPIN) 0.5 MG  tablet Take 0.5 tablets (0.25 mg total) by mouth nightly. 15 tablet 2   ??? clotrimazole-betamethasone (LOTRISONE) 1-0.05 % cream Apply 1 application topically Two (2) times a day.     ??? cyclobenzaprine (FLEXERIL) 5 MG tablet Take 1 tablet (5 mg total) by mouth two (2) times a day as needed. 60 tablet 2   ??? diclofenac sodium (VOLTAREN) 1 % gel APPLY 4 GRAMS EXTERNALLY TO THE AFFECTED AREA FOUR TIMES DAILY 300 g 5   ??? dicyclomine (BENTYL) 20 mg tablet Take 20 mg by mouth every six (6) hours.     ??? DULoxetine (CYMBALTA) 60 MG capsule Take 2 capsules (120 mg total) by mouth daily. 180 capsule 2   ??? FLOVENT HFA 110 mcg/actuation inhaler USE 2 PUFFS BY MOUTH TWO  TIMES DAILY 36 g 3   ??? fluticasone propionate (FLONASE) 50 mcg/actuation nasal spray      ??? halobetasol (ULTRAVATE) 0.05 % ointment Apply topically Two (2) times a day. 50 g 1   ??? hydrOXYchloroQUINE (PLAQUENIL) 200 mg tablet Take 1 tablet (200 mg total) by mouth Two (2) times a day. 180 tablet 3   ??? inhalational spacing device (AEROCHAMBER MV) Spcr 1 each by Miscellaneous route two (2) times a day. With Fovent 1 each 0   ??? ketoconazole (NIZORAL) 2 % cream APP EXT AA D  0   ??? lamoTRIgine (LAMICTAL) 200 MG tablet Take 1 tablet (200 mg total) by mouth Two (2) times a day. 180 tablet 2   ??? meclizine (ANTIVERT) 25 mg tablet Take 25 mg by mouth Three (3) times a day as needed.      ??? melatonin 10 mg Tab Take 1 tablet by mouth.     ??? mupirocin (BACTROBAN) 2 % ointment APP EXT IEN BID  0   ??? mycophenolate (MYFORTIC) 360 MG TbEC Take 1 tablet by mouth twice daily 180 tablet 1   ??? olopatadine (PATANOL) 0.1 % ophthalmic solution Apply to eye.     ??? omeprazole (PRILOSEC) 40 MG capsule Take 40 mg by mouth daily.      ??? ondansetron (ZOFRAN) 4 MG tablet      ??? polyethylene glycol (GLYCOLAX) 17 gram/dose powder 2 cap fulls in a full glass of water, three times a day, for 5 days.     ??? telmisartan (MICARDIS) 80 MG tablet Take 80 mg by mouth.     ??? traZODone (DESYREL) 100 MG tablet Take 1.5 tablets (150 mg total) by mouth nightly. 135 tablet 2   ??? triamcinolone (KENALOG) 0.1 % ointment Apply to rash when itchy as needed, few times per week 454 g 0     No current facility-administered medications on file prior to visit.     ??  Immunization History   Administered Date(s) Administered   ??? Hepatitis A 08/12/2015, 02/10/2016   ??? Hepatitis B, Adult 06/26/2007, 08/12/2015, 10/13/2015, 02/10/2016   ??? INFLUENZA TIV (TRI) 18MO+ W/ PRESERV (IM) 06/01/2010, 05/14/2013, 04/19/2014   ??? INFLUENZA TIV (TRI) PF (IM) 06/26/2007, 06/02/2011, 06/02/2011, 12/01/2011, 06/09/2013   ??? Influenza Vaccine Quad (IIV4 PF) 69mo+ injectable 04/16/2015, 05/26/2016, 05/17/2017, 05/14/2018, 06/12/2019   ??? MMR 06/26/2007   ??? PNEUMOCOCCAL POLYSACCHARIDE 23 08/04/2016   ??? Pneumococcal Conjugate 13-Valent 08/06/2015, 05/26/2016   ??? TdaP 06/26/2007     ????  PHYSICAL EXAM??  Vital signs: BP 133/81 (BP Site: L Arm, BP Position: Sitting, BP Cuff Size: Medium)  - Pulse 76  - Temp 36.9 ??C (98.4 ??F)  - Ht 170.2  cm (5' 7)  - Wt (!) 102 kg (224 lb 12.8 oz)  - BMI 35.21 kg/m?? Body mass index is 35.21 kg/m??.  Gen: Well-developed, well-nourished adult in no apparent distress. Normocephalic with no external signs of trauma. Pleasant and cooperative. AOx4.?  HEENT: PERRLA, EOMI, MMM, oropharynx in pink without ulcerations or thrush. No conjunctival erythema or icterus. No tonsillar exudates visualized.????    Lungs: Broad chest excursion with good air movement. CTAB without wheezing/rhonchi/rales. ????  CV: RRR, Normal S1/S2, No murmurs/rubs/gallops heard. PV: Warm, 2+ radial and pedal pulses, no C/C/E.????  Neuro: Good comprehension/cognition. CN 2-12 intact.  Muscle strength 5/5 in all extremities. Gait normal.????  Comprehensive Musculoskeletal Examination:????  ?? Jaw, neck without limited ROM.????  ?? Shoulders, elbows, wrists, hands, fingers:  No deformity, erythema, warmth, swelling, effusion, limited ROM. Reports pain in both shoulders with active and passive ROM but there is no limitation??  ?? Lumbosacral spine and hips without limited ROM.????  ?? Knee, ankles, feet, toes: No deformity, erythema, warmth, swelling, effusion, limited ROM. Knee stable to valgus/varus stress and anterior/posterior drawer sign.????  ?? Multiple myofascial trigger points at occiput, trapezius, bicipital tendon, chest wall, trochanteric bursa, pes anserine bursa and low back areas  Skin: Mild psoriasis along her back and buttock region, no other psoriasis noted on skin exam    LABORATORY  No results found for this or any previous visit (from the past 3360 hour(s)).       ????GENERAL SUMMARY AND IMPRESSION: ????  ????  In summary, the patient is a 64 y.o. female with SLE, COP, Psoriasis and Fibromyalgia as well as HTN and asthma here for follow-up in person today. Reporting pain in shoulders, low back and knees. Exam without signs for inflammatory arthritis. Pain likely related to underlying OA and fibromyalgia. Fibromyalgia active due to decreased physical activity in setting of COVID-19 pandemic and stressors. However, patient has started to return to the gym and tolerating. Lungs stable. Psoriasis mild. At this time, no indication to escalate therapy although we briefly discussed Orencia if psoriasis were to worsen. She is following with dermatology. She is up to date for her eye exam on hydroxychloroquine. Medications refilled. Lab monitoring today for disease activity and potential medication toxicity. Given shin pain, will check vitamin D level. Last bone density in 2019 normal so will wait another year before repeating. Follow-up in 4 months with Carlus Pavlov [facility] and 8 months with myself. Patient aware to reach out if needing to be seen sooner than scheduled.       RECOMMENDATIONS: ????  ????   Diagnosis ICD-10-CM Associated Orders   1. Systemic lupus erythematosus, unspecified SLE type, unspecified organ involvement status (CMS-HCC)  M32.9 25 OH Vit D     Anti-DSDNA     ALT     AST     CBC w/ Differential     Creatinine     C3 complement     C4 complement     Urinalysis with Culture Reflex     Protein/Creatinine Ratio, Urine     Serum Protein Electrophoresis and Immunofixation     hydrOXYchloroQUINE (PLAQUENIL) 200 mg tablet   2. On mycophenolate mofetil therapy  Z79.899 Anti-DSDNA     ALT     AST     CBC w/ Differential   3. Vitamin D deficiency  E55.9 25 OH Vit D   4. Other systemic lupus erythematosus with other organ involvement (CMS-HCC)  M32.19 mycophenolate (MYFORTIC) 360 MG TbEC  Patient Instructions   No change in medication needed today.    If needing systemic therapy for psoriasis, we can consider Orencia (abatacept) but I don't see a need for it today.    We will check labs today to monitor disease activity and medications.    Fibromyalgia is active today. Try to get some physical activity in each day including yoga and stretches. I know you want to get the gym but you will need a gentle start.         The patient indicates understanding of these issues and agrees to the plan as outlined above.  Contact information provided for any concerns or questions in the interim.  ??  I personally spent 35 minutes face-to-face and non-face-to-face in the care of this patient, which includes all pre, intra, and post visit time on the date of service.    Stuti Sandin C. Scarlette Calico, MD, PhD  Assistant Professor of Medicine  Department of Medicine/Division of Rheumatology  [facility]  9:35 AM

## 2020-04-03 LAB — SERUM PROTEIN ELECTROPHORESIS AND IMMUNOFIXATION
ALPHA-1 GLOBULIN: 0.3 g/dL (ref 0.2–0.5)
ALPHA-2 GLOBULIN: 0.7 g/dL (ref 0.5–1.1)
BETA-1 GLOBULIN: 0.5 g/dL (ref 0.3–0.6)
BETA-2 GLOBULIN: 0.5 g/dL (ref 0.2–0.6)
GAMMAGLOBULIN: 1.7 g/dL — ABNORMAL HIGH (ref 0.5–1.5)
PROTEIN TOTAL: 7.3 g/dL

## 2020-04-03 LAB — ALPHA-1 GLOBULIN: Lab: 0.3

## 2020-04-03 NOTE — Unmapped (Signed)
LVM TO [facility] 9/29 APPT - MOVE TO PM OR 9/22.

## 2020-04-06 ENCOUNTER — Ambulatory Visit: Admit: 2020-04-06 | Discharge: 2020-04-07 | Payer: MEDICARE

## 2020-04-06 DIAGNOSIS — M8949 Other hypertrophic osteoarthropathy, multiple sites: Secondary | ICD-10-CM | POA: Diagnosis not present

## 2020-04-06 DIAGNOSIS — Z7952 Long term (current) use of systemic steroids: Secondary | ICD-10-CM | POA: Diagnosis not present

## 2020-04-06 DIAGNOSIS — M797 Fibromyalgia: Secondary | ICD-10-CM | POA: Diagnosis not present

## 2020-04-06 DIAGNOSIS — M48061 Spinal stenosis, lumbar region without neurogenic claudication: Secondary | ICD-10-CM | POA: Diagnosis not present

## 2020-04-06 DIAGNOSIS — G894 Chronic pain syndrome: Secondary | ICD-10-CM | POA: Diagnosis not present

## 2020-04-06 DIAGNOSIS — G8929 Other chronic pain: Secondary | ICD-10-CM | POA: Diagnosis not present

## 2020-04-06 DIAGNOSIS — M545 Low back pain: Secondary | ICD-10-CM | POA: Diagnosis not present

## 2020-04-06 DIAGNOSIS — M542 Cervicalgia: Secondary | ICD-10-CM | POA: Diagnosis not present

## 2020-04-06 MED ORDER — PREGABALIN 50 MG CAPSULE: 11 refills | 30 days | Status: CP

## 2020-04-06 MED ORDER — CYCLOBENZAPRINE 5 MG TABLET
ORAL_TABLET | Freq: Two times a day (BID) | ORAL | 2 refills | 30 days | Status: CP | PRN
Start: 2020-04-06 — End: ?

## 2020-04-06 MED ORDER — PREGABALIN 50 MG CAPSULE
ORAL_CAPSULE | Freq: Two times a day (BID) | ORAL | 11 refills | 0.00000 days | Status: CP
Start: 2020-04-06 — End: 2021-04-06

## 2020-04-06 MED ORDER — DICLOFENAC 1 % TOPICAL GEL
Freq: Two times a day (BID) | TOPICAL | 5 refills | 75 days | Status: CP | PRN
Start: 2020-04-06 — End: ?

## 2020-04-06 NOTE — Unmapped (Addendum)
It was great seeing you today! You're instructions are as follows:    -We have ordered an xray of your low back.     -Please look into genicular nerve block for your knee pain    -Take Senna over the counter with your miralax for bowel movements    -Start Lyrica. Take 1 pill 50 mg at night for a week,followed by 2 pills a day for remaining weeks.    -We have refilled your voltaren gel and flexeril. Take as prescribed    -We will see you again in 3 months

## 2020-04-06 NOTE — Unmapped (Signed)
Chronic Pain Follow Up Note        Assessment and Plan    Caitlin Smith is a 64 y.o.  being followed at [facility] for complaint of chronic pain localized to right knee and bilateral neck, shoulders and upper back myofascial pain and fibromyalgia, and lower back 2/2 lumbar facet arthropathy and DDD and moderate degree of lumbar central spinal canal stenosis. Patient has a history of chronic pain related to lupus and arthritis and is on rheumatological therapy.     Today, for chronic pain we discussed lyrica. We had tried gabapentin 300 TID however she endorsed constipation. We will start lyrica 50mg  at bedtime for a week then 50mg  BID and reassess in 3 months.     We also ordered xray of her lumbar spine considering she has not had one in a while and has a history of some spinal stenosis.     For Fibromyalgia she was provided with an external referral to water therapy. Also instructed to continue pain psychology visits.    For her myofacial pain we continued her flexeril and voltaren gel since she endorses some relief with that. She also has continued knee pain even after her knee replacement so we told her to research genicular nerve block for consideration in the future.     Lastly she complained of some constipation so we told her to take senna with her miralax.    We will follow up in 3 months.    Patient does  appear to be utilizing pain medications appropriately and does  report that the medications do improve patient's quality of life and functionality level.    PLAN:  ?? Start lyrica 50mg  every day for a week followed by B  ?? External referral for PT water therapy  ?? Start senna over the counter with miralax for   constipation  ?? Continue Flexeril  ?? Continue voltaren Gel  ?? Continue pain psychology.  ?? Continue activity as able  Return in about 3 months (around 07/07/2020).    Future considerations:  Genicular / lumbar MBBs  MBB      Tried  -Gabapentin 300 TID: constipation Requested Prescriptions     Signed Prescriptions Disp Refills   ??? pregabalin (LYRICA) 50 MG capsule 60 capsule 11     Sig: Take 1 capsule (50 mg total) by mouth Two (2) times a day.   ??? diclofenac sodium (VOLTAREN) 1 % gel 300 g 5     Sig: Apply 2 g topically two (2) times a day as needed for arthritis.   ??? cyclobenzaprine (FLEXERIL) 5 MG tablet 60 tablet 2     Sig: Take 1 tablet (5 mg total) by mouth two (2) times a day as needed.       Orders Placed This Encounter   Procedures   ??? XR Lumbar Spine 2 or 3 Views     Order Specific Question:   Performed at     Answer:   [facility]     Order Specific Question:   Reason for Exam:     Answer:   lumbar back pain   ??? Ambulatory referral to Physical Therapy     Standing Status:   Future     Standing Expiration Date:   04/06/2021     Referral Priority:   Routine     Referral Type:   Physical Therapy     Number of Visits Requested:   1  Risks and benefits of above medications including but not limited to possibility of respiratory depression, sedation, and even death were discussed with the patient who expressed an understanding.     Urine toxicology screen is not due today.  Treatment agreement renewal is not due today.     HPI  Caitlin Smith is a 64 y.o. being followed at [facility] clinic for complaint of chronic pain localized to right knee and bilateral neck, shoulder, upper back, and lower back 2/2 lumbar facet arthropathy and DDD. Patient has a history of chronic pain related to lupus, arthritis, and fibromyalgia.     At last visit in June 2020, patient reported stable analgesia overall and her medications were continued without changes. In the interm, she has continued to follow up with Pain Psychology. She has also continued to follow up with Orthopedics for bilateral knee pain with no plans to proceed with left TKA at this time.    Today, she reports pain down shoulders, back and butt pain.    She endorses the low back pain radiates to the butt. Describes as dull and sharp. 6/10 back pain. Back pain is worsened when walking. Extension makes worse, no changes in flexion.    Shoulder pain she describes as achy. Exacerbated with overhead movements. 5/10 pain.    At night she has left achy left leg pain on the anterior aspect. She endorses this pain is only while sleeping    She no longer takes gabapentin because it was making her constipated.    She endorses that voltaren gel does help. As well as flexeril.    She still see's chronic pain psychology which she endorses also helps.    The patient's medication regimen:  Gabapentin 300 mg TID no longer taking  Cymbalta 120mg  am (PCP)  Trazodone 150 mg qhs (PCP)  Flexeril 5 mg BID prn  Voltaren gel prn    The patient states her pain is located shoulders, back and gluteal region and the severity of her pain ranges from 4/10 to 7/10.  Her pain currently is 5/10 and on average is 7/10.  She describes the sensation of her pain as aching. Her pain is present all of the time and worst evenings. The patient???s pain impacts recreational activities. Her interval history includes Mental Health visit. Her pain is worse, and she does not have new pain to discuss today. She is not on blood thinners or anti-coagulants. In regards to medications currently taken for pain management, the patient is tolerating these medications well and complains of associated side effects: None.    Patient denies homicidal/suicidal ideation.     Medication Monitoring  [place] database was reviewed and it was appropriate.  Last urine toxicology screen: N/A, not on opioids  The patient did not bring pill bottles today, not on opioids    Allergies  Allergies   Allergen Reactions   ??? Vilazodone Swelling   ??? Ace Inhibitors Other (See Comments)     Pt states she can not take ace inhibitors. Pt states she can not remember her reaction.    ??? Bee Pollen Itching     COUGH, WATERY EYES   ??? Penicillin G Rash   ??? Penicillins Rash   ??? Pollen Extracts Itching     COUGHING, WATERY EYES       Home Medications    Current Outpatient Medications   Medication Sig Dispense Refill   ??? amLODIPine (NORVASC) 5 MG tablet Take 1 tablet (5 mg total)  by mouth daily. 90 tablet 3   ??? aspirin (ECOTRIN) 81 MG tablet Take 81 mg by mouth daily.     ??? atorvastatin (LIPITOR) 40 MG tablet Take 40 mg by mouth daily.     ??? biotin 1 mg cap Take by mouth.     ??? buPROPion (WELLBUTRIN XL) 150 MG 24 hr tablet Take 1 tablet (150 mg total) by mouth every morning. 90 tablet 2   ??? calcipotriene (DOVONOX) 0.005 % ointment Apply every other day to areas of rash in folds as maintenance 60 g 2   ??? clobetasoL (TEMOVATE) 0.05 % ointment Apply BID to all affected area 60 g 5   ??? clonazePAM (KLONOPIN) 0.5 MG tablet Take 0.5 tablets (0.25 mg total) by mouth nightly. 15 tablet 2   ??? clotrimazole-betamethasone (LOTRISONE) 1-0.05 % cream Apply 1 application topically Two (2) times a day.     ??? cyclobenzaprine (FLEXERIL) 5 MG tablet Take 1 tablet (5 mg total) by mouth two (2) times a day as needed. 60 tablet 2   ??? diclofenac sodium (VOLTAREN) 1 % gel Apply 2 g topically two (2) times a day as needed for arthritis. 300 g 5   ??? dicyclomine (BENTYL) 20 mg tablet Take 20 mg by mouth every six (6) hours.     ??? DULoxetine (CYMBALTA) 60 MG capsule Take 2 capsules (120 mg total) by mouth daily. 180 capsule 2   ??? FLOVENT HFA 110 mcg/actuation inhaler USE 2 PUFFS BY MOUTH TWO  TIMES DAILY 36 g 3   ??? fluticasone propionate (FLONASE) 50 mcg/actuation nasal spray      ??? halobetasol (ULTRAVATE) 0.05 % ointment Apply topically Two (2) times a day. 50 g 1   ??? hydrOXYchloroQUINE (PLAQUENIL) 200 mg tablet Take 1 tablet (200 mg total) by mouth Two (2) times a day. 180 tablet 3   ??? inhalational spacing device (AEROCHAMBER MV) Spcr 1 each by Miscellaneous route two (2) times a day. With Fovent 1 each 0   ??? ketoconazole (NIZORAL) 2 % cream APP EXT AA D  0   ??? lamoTRIgine (LAMICTAL) 200 MG tablet Take 1 tablet (200 mg total) by mouth Two (2) times a day. 180 tablet 2   ??? meclizine (ANTIVERT) 25 mg tablet Take 25 mg by mouth Three (3) times a day as needed.      ??? melatonin 10 mg Tab Take 1 tablet by mouth.     ??? mupirocin (BACTROBAN) 2 % ointment APP EXT IEN BID  0   ??? mycophenolate (MYFORTIC) 360 MG TbEC Take 1 tablet by mouth twice daily 180 tablet 3   ??? olopatadine (PATANOL) 0.1 % ophthalmic solution Apply to eye.     ??? omeprazole (PRILOSEC) 40 MG capsule Take 40 mg by mouth daily.      ??? ondansetron (ZOFRAN) 4 MG tablet      ??? polyethylene glycol (GLYCOLAX) 17 gram/dose powder 2 cap fulls in a full glass of water, three times a day, for 5 days.     ??? telmisartan (MICARDIS) 80 MG tablet Take 80 mg by mouth.     ??? traZODone (DESYREL) 100 MG tablet Take 1.5 tablets (150 mg total) by mouth nightly. 135 tablet 2   ??? triamcinolone (KENALOG) 0.1 % ointment Apply to rash when itchy as needed, few times per week 454 g 0   ??? pregabalin (LYRICA) 50 MG capsule Take 1 capsule (50 mg total) by mouth Two (2) times a day. 60 capsule 11  No current facility-administered medications for this visit.     ROS  General weight loss  Cardiovascular chest pain  Gastrointestinal nausea  Skin rash  Endocrine hot flashes  Musculoskeletal muscle aches  Neurologic dizzy/vertigo  Psychiatric depression      Physical Exam    VITALS:   Vitals:    04/06/20 0816   BP: 133/81   Pulse: 75   Resp: 16   Temp: 36.5 ??C   SpO2: 98%     Wt Readings from Last 3 Encounters:   04/06/20 (!) 103.5 kg (228 lb 1.6 oz)   04/02/20 (!) 102 kg (224 lb 12.8 oz)   01/30/20 99.9 kg (220 lb 3.2 oz)     GENERAL:  The patient is a well developed, well-nourished  and appears to be in no apparent distress. The patient is pleasant and interactive. Patient is a good historian.  No evidence of sedation or intoxication.  HEAD/NECK:    Reveals normocephalic/atraumatic. clear sclera.   LUNGS:   Normal work of breathing  EXTREMITIES:  No clubbing, cyanosis noted.  NEUROLOGIC:    The patient was alert and oriented, speech fluent, normal language. MUSCULOSKELETAL:    Diffusely Tender. Motor function BUE 5/5. BLE 4/5 likely limited to pain. In all extremities with normal tone and bulk. Good range of motion of all extremities. The patient was able to ambulate without difficult throughout the clinic today without the assistance of a walking aid. Straight leg test limited to quadricep and knee pain.  SKIN:   Scar from R. Knee TKA appropriately healed.  PSY:  Appropriate Affect

## 2020-04-06 NOTE — Unmapped (Signed)
Done     Thanks  Malika

## 2020-04-07 LAB — DSDNA ANTIBODY: Lab: POSITIVE — AB

## 2020-04-09 LAB — VITAMIN D, TOTAL (25OH): Lab: 26.8

## 2020-04-10 NOTE — Unmapped (Signed)
Called patient. Reviewed labs with her. She appreciated phone call.

## 2020-04-11 NOTE — Unmapped (Signed)
Recent:   What is the date of your last related visit?  n/a  Related acute medications Rx'd:  n/a  Home treatment tried:  n/a  COVID Vaccine: Moderna # 2 vaccine    Relevant:   Allergies: Vilazodone, Ace inhibitors, Bee pollen, Penicillin g, Penicillins, and Pollen extracts  Medications: n/a  Health History: Lupus fibromyalgia, HTN,  Weight: n/a    Caryn Bee parmacist instructed her to drink plenty of water and she may have some nausea/vomiting or upset stomach from Lipitor and lower BP with BP meds. Since she's been on Wellbutrin for a while and no seizure activity no need to stay awake but if any changes call PC or HL back. Patient verbalized understanding.   Reason for Disposition  ??? MORE THAN A DOUBLE DOSE of a prescription or over-the-counter (OTC) drug    Answer Assessment - Initial Assessment Questions  1. SUBSTANCE: What was swallowed? If necessary, have the caller look at the label on the container.       Telmisartan,  Lipitor, Hydroxychloroquine Wellbutrin  2. AMOUNT: How much was swallowed? (Err on the side of recording the maximal amount that is missing)       Telmisartan 160 mg and Wellbutrin 150mg  2 tabs BID  which she took 600mg . Lipitor 80mg . Hydroxychloroquine 400mg  Caller took morning meds again  3. ONSET: When was it probably swallowed? (Minutes or hours ago)       nightime meds @ 2100  4. SYMPTOMS: Do you have any symptoms? If Yes, ask: What are they? (e.g., abdominal pain, vomiting, weakness)       Not now  5. SUICIDAL: Did you take this to hurt or kill yourself?      No-accidentally took morning meds again  6. PREGNANCY: Is there any chance you are pregnant? When was your last menstrual period?      n/a    Protocols used: POISONING-A-AH

## 2020-04-12 ENCOUNTER — Ambulatory Visit
Admit: 2020-04-12 | Discharge: 2020-04-13 | Payer: MEDICARE | Attending: Emergency Medicine | Primary: Emergency Medicine

## 2020-04-12 ENCOUNTER — Ambulatory Visit: Admit: 2020-04-12 | Discharge: 2020-04-13 | Payer: MEDICARE

## 2020-04-12 DIAGNOSIS — M48061 Spinal stenosis, lumbar region without neurogenic claudication: Secondary | ICD-10-CM | POA: Diagnosis not present

## 2020-04-12 DIAGNOSIS — M545 Low back pain: Secondary | ICD-10-CM | POA: Diagnosis not present

## 2020-04-12 DIAGNOSIS — M47817 Spondylosis without myelopathy or radiculopathy, lumbosacral region: Secondary | ICD-10-CM | POA: Diagnosis not present

## 2020-04-12 DIAGNOSIS — G8929 Other chronic pain: Secondary | ICD-10-CM | POA: Diagnosis not present

## 2020-04-12 DIAGNOSIS — L309 Dermatitis, unspecified: Secondary | ICD-10-CM | POA: Diagnosis not present

## 2020-04-12 DIAGNOSIS — N898 Other specified noninflammatory disorders of vagina: Principal | ICD-10-CM

## 2020-04-12 MED ORDER — DESONIDE 0.05 % TOPICAL OINTMENT
Freq: Two times a day (BID) | TOPICAL | 0 refills | 14.00000 days | Status: CP
Start: 2020-04-12 — End: 2020-04-26

## 2020-04-12 NOTE — Unmapped (Addendum)
It was a pleasure taking care of you today!    We have sent testing to rule out an internal problem, and we will treat the external itching.  The external itching is most likely dermatitis skin irritation similar to your eczema.    We recommend gentle washing of the area with a mild soap and plenty of water.  Avoid any cleansing wipes or pads or over-the-counter ointments, as these may have preservatives that can irritate the skin.  Keep the area as dry as possible and wear breathable cotton underwear.    There is a small area of open skin on the left lower side, you can cover this area with plain Vaseline as a barrier to help the skin heal.    We will prescribe a steroid ointment  (Desonide) to be applied twice a day for about 2 weeks to the affected areas.  After that time, we recommend following up with dermatology to determine if the treatment is working, or if other diagnoses need to be considered.

## 2020-04-12 NOTE — Unmapped (Signed)
[facility]      [address], [place], [place]  Subjective:     Triage Notes:  Chief Complaint   Patient presents with   ??? Vaginal Itching     x 3 weeks       HPI: Caitlin Smith is a pleasant 64 y.o. female with GERD, chronic constipation, asthma, pulmonary fibrosis, lupus, diabetes, hypertension, fibromyalgia, chronic pain syndrome, CKD, dyslipidemia, history of VIN, eczema here for 3 weeks of vaginal itching.    The itching is over her bilateral labia and extending down towards the gluteal folds.  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  Has not noticed any lesions in her GU area.  No abnormal vaginal discharge, no recent antibiotics, no hematuria, no fevers, no frequency, sometimes the skin burns with urination, no back or abdominal pain.    The patient reports no new medications recently aside from Lyrica, which she has not started taking it.    She reports using over-the-counter Vagisil ointment as well as anti-itch cleansing wipes without relief.    Has not been sexually active for several years, is not concerned about STI.    I have reviewed past medical, surgical, medications, allergies, social and family histories today.      Current Outpatient Medications:   ???  amLODIPine (NORVASC) 5 MG tablet, Take 1 tablet (5 mg total) by mouth daily., Disp: 90 tablet, Rfl: 3  ???  aspirin (ECOTRIN) 81 MG tablet, Take 81 mg by mouth daily., Disp: , Rfl:   ???  atorvastatin (LIPITOR) 40 MG tablet, Take 40 mg by mouth daily., Disp: , Rfl:   ???  biotin 1 mg cap, Take by mouth., Disp: , Rfl:   ???  buPROPion (WELLBUTRIN XL) 150 MG 24 hr tablet, Take 1 tablet (150 mg total) by mouth every morning., Disp: 90 tablet, Rfl: 2  ???  calcipotriene (DOVONOX) 0.005 % ointment, Apply every other day to areas of rash in folds as maintenance, Disp: 60 g, Rfl: 2  ???  clobetasoL (TEMOVATE) 0.05 % ointment, Apply BID to all affected area, Disp: 60 g, Rfl: 5  ???  clonazePAM (KLONOPIN) 0.5 MG tablet, Take 0.5 tablets (0.25 mg total) by mouth nightly., Disp: 15 tablet, Rfl: 2  ???  clotrimazole-betamethasone (LOTRISONE) 1-0.05 % cream, Apply 1 application topically Two (2) times a day., Disp: , Rfl:   ???  cyclobenzaprine (FLEXERIL) 5 MG tablet, Take 1 tablet (5 mg total) by mouth two (2) times a day as needed., Disp: 60 tablet, Rfl: 2  ???  diclofenac sodium (VOLTAREN) 1 % gel, Apply 2 g topically two (2) times a day as needed for arthritis., Disp: 300 g, Rfl: 5  ???  dicyclomine (BENTYL) 20 mg tablet, Take 20 mg by mouth every six (6) hours., Disp: , Rfl:   ???  FLOVENT HFA 110 mcg/actuation inhaler, USE 2 PUFFS BY MOUTH TWO  TIMES DAILY, Disp: 36 g, Rfl: 3  ???  fluticasone propionate (FLONASE) 50 mcg/actuation nasal spray, , Disp: , Rfl:   ???  halobetasol (ULTRAVATE) 0.05 % ointment, Apply topically Two (2) times a day., Disp: 50 g, Rfl: 1  ???  hydrOXYchloroQUINE (PLAQUENIL) 200 mg tablet, Take 1 tablet (200 mg total) by mouth Two (2) times a day., Disp: 180 tablet, Rfl: 3  ???  inhalational spacing device (AEROCHAMBER MV) Spcr, 1 each by Miscellaneous route two (2) times a day. With [place],  Disp: 1 each, Rfl: 0  ???  ketoconazole (NIZORAL) 2 % cream, APP EXT AA D, Disp: , Rfl: 0  ???  meclizine (ANTIVERT) 25 mg tablet, Take 25 mg by mouth Three (3) times a day as needed. , Disp: , Rfl:   ???  melatonin 10 mg Tab, Take 1 tablet by mouth., Disp: , Rfl:   ???  mupirocin (BACTROBAN) 2 % ointment, APP EXT IEN BID, Disp: , Rfl: 0  ???  mycophenolate (MYFORTIC) 360 MG TbEC, Take 1 tablet by mouth twice daily, Disp: 180 tablet, Rfl: 3  ???  olopatadine (PATANOL) 0.1 % ophthalmic solution, Apply to eye., Disp: , Rfl:   ???  omeprazole (PRILOSEC) 40 MG capsule, Take 40 mg by mouth daily. , Disp: , Rfl:   ???  ondansetron (ZOFRAN) 4 MG tablet, , Disp: , Rfl:   ???  polyethylene glycol (GLYCOLAX) 17 gram/dose powder, 2 cap fulls in a full glass of water, three times a day, for 5 days., Disp: , Rfl:   ??? pregabalin (LYRICA) 50 MG capsule, Take 1 capsule (50 mg total) by mouth Two (2) times a day., Disp: 60 capsule, Rfl: 11  ???  sucralfate (CARAFATE) 1 gram tablet, , Disp: , Rfl:   ???  telmisartan (MICARDIS) 80 MG tablet, Take 80 mg by mouth., Disp: , Rfl:   ???  triamcinolone (KENALOG) 0.1 % ointment, Apply to rash when itchy as needed, few times per week, Disp: 454 g, Rfl: 0  ???  desonide (DESOWEN) 0.05 % ointment, Apply topically Two (2) times a day for 14 days., Disp: 15 g, Rfl: 0  ???  DULoxetine (CYMBALTA) 60 MG capsule, Take 2 capsules (120 mg total) by mouth daily., Disp: 180 capsule, Rfl: 2  ???  lamoTRIgine (LAMICTAL) 200 MG tablet, Take 1 tablet (200 mg total) by mouth Two (2) times a day., Disp: 180 tablet, Rfl: 2  ???  traZODone (DESYREL) 100 MG tablet, Take 1.5 tablets (150 mg total) by mouth nightly., Disp: 135 tablet, Rfl: 2     ROS:     Review of Systems    Review of systems negative unless otherwise noted as per HPI.    Objective:     Vitals:    04/12/20 1047   BP: 146/87   BP Site: R Arm   BP Position: Sitting   BP Cuff Size: X-Large   Pulse: 77   Resp: 18   Temp: 36.6 ??C (97.9 ??F)   TempSrc: Temporal   SpO2: 98%   Weight: (!) 103.4 kg (228 lb)   Height: 172.7 cm (5' 8)       Body mass index is 34.67 kg/m??.    Physical Exam  Vitals reviewed. Exam conducted with a chaperone present Apolonio Schneiders, RN).   Constitutional:       General: She is not in acute distress.     Appearance: Normal appearance. She is not ill-appearing or diaphoretic.   HENT:      Head: Normocephalic and atraumatic.   Eyes:      General: No scleral icterus.     Conjunctiva/sclera: Conjunctivae normal.   Cardiovascular:      Rate and Rhythm: Normal rate.   Pulmonary:      Effort: Pulmonary effort is normal. No respiratory distress.   Genitourinary:     Exam position: Supine.      Labia:         Right: Rash present.         Left: Rash  and lesion present.       Vagina: No vaginal discharge, tenderness or bleeding.          Comments: Mild erythema of bilateral labia majora  Bilateral gluteal folds, some patches with loss of epidermis, not macerated  Musculoskeletal:         General: Normal range of motion.      Cervical back: Normal range of motion.   Skin:     General: Skin is warm and dry.   Neurological:      General: No focal deficit present.      Mental Status: She is alert and oriented to person, place, and time.   Psychiatric:         Mood and Affect: Mood normal.         Behavior: Behavior normal.            Assessment and Plan:     Differential Diagnosis: BV versus yeast versus eczema versus lichen planus versus lichen sclerosis versus vulvar dermatitis versus allergic contact dermatitis    Presentation most consistent with a vulvar dermatitis, suspect atopic dermatitis given history of eczema and appearance of lesions.  There is an area of eroded skin on the left labia minora, will recommend GYN follow-up, which the patient has next month, for continued surveillance of this lesion given history of VIN.  Also has follow-up with dermatology, if this does not improve and requires biopsy.    We will advise keeping the area clean and dry, using minimal soap, no wipes or fragrances, apply desonide twice daily for 2 weeks to the labia majora and minora and gluteal folds.  Can also use Vaseline for barrier cream for healing.  Patient has follow-up with both dermatology and GYN, either is appropriate to follow-up this issue, which seem to be primarily external.  However testing for internal vaginitis was also sent to rule out these etiologies.    Pallie was seen today for vaginal itching.    Diagnoses and all orders for this visit:    Vulvar dermatitis    Vaginal itching  -     Wet prep, genital    Other orders  -     desonide (DESOWEN) 0.05 % ointment; Apply topically Two (2) times a day for 14 days.       Results    PHQ-2 Score: 0    PHQ-9 Score:      Edinburgh Score:      Screening complete, no depression identified / no further action needed today    Discussed my evaluation, clinical impression, and discharge plan. Also discussed anticipatory guidance and return precautions. Patient expressed understanding, has no questions or concerns at this time.       Note - This record has been created using [employer]. Chart creation errors have been sought, but may not always have been located. Such creation errors do not reflect on the standard of medical care.    Roylene Reason, PA-C  [facility]

## 2020-04-14 MED ORDER — METRONIDAZOLE 0.75 % TOPICAL GEL
0 refills | 0 days | Status: CP
Start: 2020-04-14 — End: ?

## 2020-04-15 MED ORDER — CLONAZEPAM 0.5 MG TABLET
ORAL_TABLET | Freq: Every evening | ORAL | 2 refills | 30.00000 days | Status: CP
Start: 2020-04-15 — End: ?

## 2020-04-15 NOTE — Unmapped (Signed)
Sent refill of clonazepam 0.5mg  tablets, #15, refill 2 to [facility] in [place]. PDMP checked and filling appropriate.

## 2020-04-20 MED ORDER — CLONAZEPAM 0.5 MG TABLET: 0 mg | tablet | Freq: Every evening | 2 refills | 30 days | Status: AC

## 2020-04-20 MED ORDER — CLONAZEPAM 0.5 MG TABLET
ORAL_TABLET | Freq: Every evening | ORAL | 2 refills | 30.00000 days | Status: CP
Start: 2020-04-20 — End: 2020-04-20

## 2020-04-20 NOTE — Unmapped (Signed)
Pt called with question regarding recent lab work, wants to know how her kidneys are doing. Reviewed results from end of July - Cr stable/normal, no proteinuria, CBC wnl.

## 2020-04-20 NOTE — Unmapped (Signed)
Reordered Ms. Dudding's clonazepam today. Spoke with pharmacy who says they did not receive the script. On further review, rx was initially sent 04/15/20 as print. Second retry today was also sent as print. Therefore, discontinued print and sent to [facility] pharmacy, #15 of 0.5mg  with instructions to take 0.25 at bedtime. Will message Ms. Roan to inform her the medicines are in.

## 2020-04-21 NOTE — Unmapped (Signed)
Called and s/w pt to offer apt on 8/20 at [facility]  with Filed at 3:20pm. sooner apt requested per inbskt msg from [place] (via Mychart message). have apt on hold with pt name.

## 2020-04-21 NOTE — Unmapped (Signed)
Outpatient Gynecology Note: Return Visit    ASSESSMENT AND PLAN     Caitlin Smith is a 64 y.o. [id number] who presents for vulvar pruritis and rash.    Vulvar pruritis:   - Pruritis of bilateral vulva extending to the edge of her inner thighs for several weeks  - Erythema and mild excoriation of lateral aspect of bilateral vulva (L>R) extending to inner thigh  - Biopsy of lateral aspect of right vulva performed  - Vulvar hygiene reviewed and written instructions provided  - Plan for trial of clobetasol 0.05% ointment nightly unless otherwise indicated by biopsy results with follow up in 6-8 weeks    Bacterial vaginosis:   - Diagnosed with BV at urgent care however had used metronidazole gel topically on vulva rather than intravaginally  - New prescription provided and instructions reviewed     Return in about 8 weeks (around 06/19/2020) for recheck.    Dr. Verna Czech was present for exam and in agreement with plan.     HISTORY     CC: vulvar itching    HPI:  Caitlin Smith presents for vulvar itching and rash. She was last seen by Dr. Lu Duffel on 04/13/2018. At that time her exam was notable for aceto-white epithelium at left perineum/vulvar junction. Biopsy of this area revealed psoriasiform dermatitis and changes suggestive of yeast infection (treated with diflucan). The plan at that time was for a trial of clobetasol cream and treatment of her yeast infection. She does not specifically recall using clobetasol cream at the time she was prescribed and had not been using any creams or ointment on her vulva prior to the development of these new symptoms.    She reports several weeks of itching of her bilateral vulva extending to the edge of her inner thighs and down to her perianal region. Denies vaginal discharge and vaginal bleeding. She denies any precipitating factors. Reports she is not sexually active. She wears a panty liner daily but denies urinary incontinence or heavy discharge. She tried using over the counter vagisil and vagicreme pads with no relief.  She was seen by [facility] on 04/12/20 and was thought to have vulvar dermatitis. Her wet prep at that time showed intermediate growth of bacterial vaginosis and no growth of budding yeast. She was treated with twice daily desonide ointment and prescribed metronidazole gel. She has not noticed significant improvement with these medications. She does state that she was applying both of the medications topically, including the metronidazole gel.     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 In addition, she had a vulvar lesion biopsed in 2015 that revealed lichen simplex chronicus.     Review of Systems  A 12 system review of systems was negative except as noted in HPI    The following portions of the patient's history were reviewed and updated as appropriate: allergies, current medications, past obstetric history, family history, past medical history, past social history, past surgical history and problem list.    PHYSICAL EXAM   BP 134/82  - Pulse 73  - Wt (!) 102.1 kg (225 lb)  - BMI 34.21 kg/m??   Body mass index is 34.21 kg/m??.     Constitutional: Well appearing, in no acute distress   Cardiovascular: Normal rate.    Pulmonary/Chest: Normal work of breathing.   Abdominal: Soft, non-tender, non-distended  Adnexa/Parametria: No masses; no parametrial nodularity; no tenderness  Genitourinary:  External Genitalia: Mild erythema and excoriation of the later aspects of the bilateral labia majora extending to the inner thighs, R>L. Otherwise normal external genitalia.      Vagina: Normal vaginal epithelium, no lesions     Cervix: Surgically absent     Uterus: Surgically absent     Adnexa: Nontender, non palpable masses   Extremities: No edema or calf tenderness bilaterally.  Neurological: She is alert and oriented to person, place, and time.   Skin: Warm, dry, no rash  Neuro: Alert and conversational  Psychiatric: Normal mood and affect    LABS AND IMAGING     Lab Results   Component Value Date    WBC 4.5 04/02/2020    HGB 13.2 04/02/2020    HCT 40.9 04/02/2020    PLT 334 04/02/2020     Lab Results   Component Value Date    CREATININE 0.90 (H) 04/02/2020       XR Lumbar Spine 2 or 3 Views    Result Date: 04/12/2020  EXAM: XR LUMBAR SPINE 2 OR 3 VIEWS DATE: 04/12/2020 12:09 PM ACCESSION: [id number] UN DICTATED: 04/12/2020 1:01 PM INTERPRETATION LOCATION: [facility] CLINICAL INDICATION: 64 years old Female with lumbar back pain  - M48.061 - Spinal stenosis of lumbar region, unspecified whether neurogenic claudication present - M54.5 - Chronic midline low back pain - G89.29 - Chronic midline low back pain  COMPARISON: None. TECHNIQUE: AP and lateral views of the lumbar spine. FINDINGS: No definitive loss of vertebral body height. Question mild loss of disc space height at L5-S1. No spondylolisthesis. Degenerative osteoarthrosis is noted at L5-S1, especially on the right. Osteoarthritic changes are also noted at L3-L4 and L4-L5. There is mild rotatory dextrocurvature of the lumbar spine. The visualized bowel gas pattern demonstrates moderate colonic stool.     1.Degenerative osteoarthrosis from L3-S1, greatest at right L5-S1 facet. 2.Mild loss of disc space height at L5-S1.      Answers for HPI/ROS submitted by the patient on 04/23/2020  genital itching: Yes  genital lesions: Yes  genital odor: No  genital rash: Yes  missed menses: No  pelvic pain: No  vaginal bleeding: No  vaginal discharge: No  Chronicity: new  Onset: 1 to 4 weeks ago  Frequency: constantly  Severity of pain: moderate  Affected side: both  Pregnant now?: No  abdominal pain: Yes  anorexia: No  back pain: Yes  chills: No  constipation: Yes  diarrhea: No  discolored urine: No  dysuria: No  fever: No  flank pain: No  frequency: No  headaches: No  hematuria: No  joint pain: Yes  joint swelling: Yes  nausea: No  painful intercourse: No  rash: Yes  sore throat: No  urgency: No  vomiting: No  Aggravated by: nothing  Sexual activity: not sexually active  Partner with STD symptoms: no  Birth control: nothing

## 2020-04-24 ENCOUNTER — Ambulatory Visit
Admit: 2020-04-24 | Discharge: 2020-04-25 | Payer: MEDICARE | Attending: Student in an Organized Health Care Education/Training Program | Primary: Student in an Organized Health Care Education/Training Program

## 2020-04-24 DIAGNOSIS — B379 Candidiasis, unspecified: Secondary | ICD-10-CM | POA: Diagnosis not present

## 2020-04-24 DIAGNOSIS — N760 Acute vaginitis: Secondary | ICD-10-CM | POA: Diagnosis not present

## 2020-04-24 DIAGNOSIS — B9689 Other specified bacterial agents as the cause of diseases classified elsewhere: Secondary | ICD-10-CM | POA: Diagnosis not present

## 2020-04-24 DIAGNOSIS — L308 Other specified dermatitis: Secondary | ICD-10-CM | POA: Diagnosis not present

## 2020-04-24 DIAGNOSIS — L408 Other psoriasis: Secondary | ICD-10-CM | POA: Diagnosis not present

## 2020-04-24 DIAGNOSIS — L299 Pruritus, unspecified: Secondary | ICD-10-CM | POA: Diagnosis not present

## 2020-04-24 DIAGNOSIS — L292 Pruritus vulvae: Principal | ICD-10-CM

## 2020-04-24 MED ORDER — CLOBETASOL 0.05 % TOPICAL OINTMENT
Freq: Every evening | TOPICAL | 0 refills | 0.00000 days | Status: CP
Start: 2020-04-24 — End: 2021-04-24

## 2020-04-24 MED ORDER — METRONIDAZOLE 0.75 % TOPICAL GEL
TOPICAL | 0 refills | 0.00000 days | Status: CP
Start: 2020-04-24 — End: ?

## 2020-04-24 NOTE — Unmapped (Signed)
Patient Instructions: Vulvar Hygiene    Sprays, Perfumes and Douching  - Do not use any feminine sprays, deodorants, perfumes, or fragrances of any kind on the vulva, including colored or scented toilet paper.  - Do not use vinegar water, medicated or commercial douches unless directed by your doctor.  - Do not use scented pads or sanitary napkins.    Showering and Baths  - Wash vulvar with lukewarm (not warm, not hot) water only.   --Use plain water or simple soaps such as Cetaphil or unscented Dove.    --Do not use bubble bath.    --After bath or shower, be sure to rinse well with clear water to ensure that no soap remains.   --Pat dry with fluffy towel. Do not rub. Finish drying with a hand held blow dryer on low heat setting.   - Consider washing hair, applying conditioner hair in sink to avoid irritation of the vulva with these products    Clothing  - Wear 100% cotton underwear (not cotton blend).   - Put underwear through the rinse cycle twice. Avoid fabric softeners.  - Avoid tight fitting clothing. If wearing panty hose, be sure it has a cotton liner to it.    - Do not wear underwear to bed at night.  - Avoid wearing swim suits, or damp or sweaty clothing for long periods of time.    Partners  - Encourage partner to wash their genital area with mild soaps as well.

## 2020-04-24 NOTE — Unmapped (Deleted)
Outpatient Gynecology Note: Return Visit    ASSESSMENT AND PLAN     Caitlin Smith is a 64 y.o. [id number] who presents for vaginal pruritis.    No problem-specific Assessment & Plan notes found for this encounter.    Hx VIN:  We suggest follow-up with a gynecologic examination (including visual inspection of the vulva) every six months for five years and then annually.     No follow-ups on file.    Dr. Verna Czech was available for the care of this patient.    HISTORY     CC: vaginal itching     HPI:  Caitlin Smith presents for a few weeks of vulvar itching. She was last seen by Dr. Lu Duffel on 04/13/2018. At that time her exam was notable for aceto-white epithelium at left perineum/vulvar junction. Biopsy of this area revealed psoriasiform dermatitis and changes suggestive of yeast infection (treated with diflucan). The plan at that time was for a trial of clobetasol cream.     Over the past few weeks she has experienced itching of her vulva which occasionally includes her anus. She endorses feeling a bump on her vulva and some flaking skin when the itching began, but she does not know if it is still present. She tried OTC vagicream pads and vagisil with no relief. She was seen by [facility] on 04/12/20 and was thought to have vulvar dermatitis. Her wet prep at that time showed intermediate growth of bacterial vaginosis and no growth of budding yeast. She was treated with twice daily desonide ointment and metronidazole gel. She has not noticed significant improvement with these medications, though she notes that she has some difficulty applying them both at the same time. She notes that this itching is similar to what she felt in the past when she had an HPV outbreak or when she has had a yeast infection. She denies noticing any redness or irritation to her vulva, abnormal vaginal discharge, or vaginal pain.    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 In addition, she had a vulvar lesion biopsed in 2015 that revealed lichen simplex chronicus.     Review of Systems  A 12 system review of systems was negative except as noted in HPI.    The following portions of the patient's history were reviewed and updated as appropriate: allergies, current medications, past obstetric history, family history, past medical history, past social history, past surgical history and problem list.    PHYSICAL EXAM   BP 134/82  - Pulse 73  - Wt (!) 102.1 kg (225 lb)  - BMI 34.21 kg/m??   Body mass index is 34.21 kg/m??.     Constitutional: {Blank single:19197::No distress,***}.   Cardiovascular: {Blank multiple:19197::Normal rate,Regular rate and rhythm,nml s1/s2,no M/R/G}.    Pulmonary/Chest: {Blank multiple:19197::Normal work of breathing,Lungs clear to ausculation in all lung fields,no wheezes,no crackles,no rales}.   Breasts: {pe breast exam:315056::breasts appear normal, no suspicious masses, no skin or nipple changes or axillary nodes}.  Abdominal: {Blank multiple 2:29302::Bowel sounds present,Soft,severly TTP in the ***,moderately TTP in the ***,mildly TTP in the ***,not TTP,guarding was present,no guarding,no rigidity,no rebound,rebound was present,Palpable uterine contractions}.   Adnexa/Parametria: No masses; no parametrial nodularity; no tenderness  Genitourinary:          External Genitalia: Normal female genitalia     Urethral Meatus: Normal caliber and position     Urethra: Midline, no masses     Bladder: Well-suspended, NT  Vagina: {Vagina:210566}, no lesions.     Cervix: No lesions, normal size and consistency; no cervical motion tenderness      Uterus: Normal size and contour; smooth, mobile, nontender  Extremities: No edema or calf tenderness bilaterally.  Neurological: She is alert and oriented to person, place, and time.   Skin: Warm, dry, no rash  Neuro: Alert and conversational  Psychiatric: Normal mood and affect    LABS AND IMAGING     Lab Results Component Value Date    WBC 4.5 04/02/2020    HGB 13.2 04/02/2020    HCT 40.9 04/02/2020    PLT 334 04/02/2020     Lab Results   Component Value Date    CREATININE 0.90 (H) 04/02/2020     Microscopic wet-mount exam shows {test wet mount:315123}.  UA {urinalysis result:36014}    XR Lumbar Spine 2 or 3 Views    Result Date: 04/12/2020  EXAM: XR LUMBAR SPINE 2 OR 3 VIEWS DATE: 04/12/2020 12:09 PM ACCESSION: [id number] UN DICTATED: 04/12/2020 1:01 PM INTERPRETATION LOCATION: [facility] CLINICAL INDICATION: 64 years old Female with lumbar back pain  - M48.061 - Spinal stenosis of lumbar region, unspecified whether neurogenic claudication present - M54.5 - Chronic midline low back pain - G89.29 - Chronic midline low back pain  COMPARISON: None. TECHNIQUE: AP and lateral views of the lumbar spine. FINDINGS: No definitive loss of vertebral body height. Question mild loss of disc space height at L5-S1. No spondylolisthesis. Degenerative osteoarthrosis is noted at L5-S1, especially on the right. Osteoarthritic changes are also noted at L3-L4 and L4-L5. There is mild rotatory dextrocurvature of the lumbar spine. The visualized bowel gas pattern demonstrates moderate colonic stool.     1.Degenerative osteoarthrosis from L3-S1, greatest at right L5-S1 facet. 2.Mild loss of disc space height at L5-S1.      Answers for HPI/ROS submitted by the patient on 04/23/2020  genital itching: Yes  genital lesions: Yes  genital odor: No  genital rash: Yes  missed menses: No  pelvic pain: No  vaginal bleeding: No  vaginal discharge: No  Chronicity: new  Onset: 1 to 4 weeks ago  Frequency: constantly  Severity of pain: moderate  Affected side: both  Pregnant now?: No  abdominal pain: Yes  anorexia: No  back pain: Yes  chills: No  constipation: Yes  diarrhea: No  discolored urine: No  dysuria: No  fever: No  flank pain: No  frequency: No  headaches: No  hematuria: No  joint pain: Yes  joint swelling: Yes  nausea: No  painful intercourse: No  rash: Yes  sore throat: No  urgency: No  vomiting: No  Aggravated by: nothing  Sexual activity: not sexually active  Partner with STD symptoms: no  Birth control: nothing      Answers for HPI/ROS submitted by the patient on 04/23/2020  genital itching: Yes  genital lesions: Yes  genital odor: No  genital rash: Yes  missed menses: No  pelvic pain: No  vaginal bleeding: No  vaginal discharge: No  Chronicity: new  Onset: 1 to 4 weeks ago  Frequency: constantly  Severity of pain: moderate  Affected side: both  Pregnant now?: No  abdominal pain: Yes  anorexia: No  back pain: Yes  chills: No  constipation: Yes  diarrhea: No  discolored urine: No  dysuria: No  fever: No  flank pain: No  frequency: No  headaches: No  hematuria: No  joint pain: Yes  joint swelling: Yes  nausea: No  painful intercourse: No  rash: Yes  sore throat: No  urgency: No  vomiting: No  Aggravated by: nothing  Sexual activity: not sexually active  Partner with STD symptoms: no  Birth control: nothing

## 2020-04-29 ENCOUNTER — Ambulatory Visit (INDEPENDENT_AMBULATORY_CARE_PROVIDER_SITE_OTHER): Payer: Medicare Other

## 2020-04-29 ENCOUNTER — Encounter
Admit: 2020-04-29 | Discharge: 2020-04-30 | Payer: MEDICARE | Attending: Student in an Organized Health Care Education/Training Program | Primary: Student in an Organized Health Care Education/Training Program

## 2020-04-29 DIAGNOSIS — I10 Essential (primary) hypertension: Secondary | ICD-10-CM | POA: Diagnosis not present

## 2020-04-29 DIAGNOSIS — E785 Hyperlipidemia, unspecified: Secondary | ICD-10-CM

## 2020-04-29 DIAGNOSIS — M797 Fibromyalgia: Secondary | ICD-10-CM

## 2020-04-29 MED ORDER — BUPROPION HCL XL 150 MG 24 HR TABLET, EXTENDED RELEASE
ORAL_TABLET | Freq: Every morning | ORAL | 2 refills | 90.00000 days | Status: CP
Start: 2020-04-29 — End: ?

## 2020-04-29 MED ORDER — TRAZODONE 100 MG TABLET
ORAL_TABLET | Freq: Every evening | ORAL | 2 refills | 90 days | Status: CP
Start: 2020-04-29 — End: 2020-07-28

## 2020-04-29 MED ORDER — LAMOTRIGINE 200 MG TABLET
ORAL_TABLET | Freq: Two times a day (BID) | ORAL | 2 refills | 90 days | Status: CP
Start: 2020-04-29 — End: 2020-07-28

## 2020-04-29 NOTE — Chronic Care Management (AMB) (Signed)
Chronic Care Management   Follow Up Note   04/29/2020 Name: SERENIDY WALTZ MRN: [id number] DOB: 09/04/1956  Primary Care Provider: Steele Sizer, MD Reason for referral : Chronic Care Management   ROSEZELLA KRONICK is a 64 y.o. year old female who is a primary care patient of Steele Sizer, MD. She is currently engaged with the chronic care management team. A routine telephonic outreach was conducted today.  Review of Ms. Logsdon's status, including review of consultants reports, relevant labs and test results was conducted today. Collaboration with appropriate care team members was performed as part of the comprehensive evaluation and provision of chronic care management services.    SDOH (Social Determinants of Health) assessments performed: No    Outpatient Encounter Medications as of 04/29/2020  Medication Sig Note  . cyclobenzaprine (FLEXERIL) 5 MG tablet TAKE 1 TABLET BY MOUTH  EVERY 8 HOURS AS NEEDED FOR MUSCLE SPASMS. DO NOT DRIVE FOR 8 HOURS 4/53/6468: prn  . pregabalin (LYRICA) 50 MG capsule Take 50 mg by mouth 2 (two) times daily.   Marland Kitchen amLODipine (NORVASC) 5 MG tablet Take 1 tablet (5 mg total) by mouth daily.   Marland Kitchen aspirin EC 81 MG tablet Take 1 tablet (81 mg total) by mouth daily.   Marland Kitchen atorvastatin (LIPITOR) 40 MG tablet TAKE 1 TABLET BY MOUTH  DAILY   . Biotin 1 MG CAPS Take 1 capsule by mouth daily.   Marland Kitchen buPROPion (WELLBUTRIN XL) 150 MG 24 hr tablet Take 150 mg by mouth every morning.   . Cholecalciferol (VITAMIN D) 2000 UNITS CAPS Take 1 capsule by mouth daily. 03/18/2019: Needs to pick up from pharmacy  . clonazePAM (KLONOPIN) 0.5 MG tablet Take 0.5 mg by mouth daily.   . clotrimazole-betamethasone (LOTRISONE) cream Apply 1 application topically 2 (two) times daily.   . diclofenac sodium (VOLTAREN) 1 % GEL APP 2 GRAMS EXT AA QID 04/28/2015: Received from: External Pharmacy  . DULoxetine (CYMBALTA) 60 MG capsule Take 120 mg by mouth at bedtime.   . fluticasone  (FLONASE) 50 MCG/ACT nasal spray Place 2 sprays into both nostrils daily.   . halobetasol (ULTRAVATE) 0.05 % ointment Apply 1 each topically daily.   . hydroxychloroquine (PLAQUENIL) 200 MG tablet Take by mouth 2 (two) times daily.   Marland Kitchen lamoTRIgine (LAMICTAL) 200 MG tablet Take 200 mg by mouth 2 (two) times daily.   . meclizine (ANTIVERT) 25 MG tablet Take 1 tablet (25 mg total) by mouth 3 (three) times daily as needed. 02/14/2020: prn  . Melatonin 10 MG TABS Take 1 tablet by mouth at bedtime.   . mycophenolate (MYFORTIC) 360 MG TBEC EC tablet Take 360 mg by mouth 2 (two) times daily.   Marland Kitchen olopatadine (PATANOL) 0.1 % ophthalmic solution Place 1 drop into both eyes 2 (two) times daily.   Marland Kitchen omeprazole (PRILOSEC) 40 MG capsule TAKE 1 CAPSULE BY MOUTH  DAILY   . ondansetron (ZOFRAN) 4 MG tablet TAKE 1 TABLET BY MOUTH  EVERY 8 HOURS AS NEEDED FOR NAUSEA AND VOMITING 02/14/2020: prn  . Polyethylene Glycol 3350 (MIRALAX PO) Take by mouth. 02/14/2020: prn  . telmisartan (MICARDIS) 80 MG tablet Take 1 tablet (80 mg total) by mouth daily.   . traZODone (DESYREL) 100 MG tablet Take 150 mg by mouth at bedtime.   . triamcinolone ointment (KENALOG) 0.1 %  02/14/2020: prn   No facility-administered encounter medications on file as of 04/29/2020.     Goals Addressed  This Visit's Progress   . COMPLETED: Chronic Disease Management       CARE PLAN ENTRY (see longitudinal plan of care for additional care plan information)  Current Barriers:  . Chronic Disease Management support and education needs related to Hypertension, Asthma, GERD, Chronic Kidney Disease, Dyslipidemia and Systemic lupus erythematosus (lung involvement)  Nurse Case Manager Clinical Goal(s):  Marland Kitchen Over the next 90 days, patient will not require hospitalization or emergent evaluation d/t chronic illnesses. . Over the next 90 days, patient will attend all medical appointments. . Over the next 90 days, patient will take all medications  as prescribed. . Over the next 90 days, patient will weigh at least once a week and record readings. . Over the next 90 days, patient will monitor blood pressure daily and record readings. . Over the next 90 days, patient will follow recommended safety measures to prevent falls and injuries. . Over the next 10 days, patient will work with Care Guides to discuss options for transportation assistance.  Interventions:  . Inter-disciplinary care team collaboration (see longitudinal plan of care) . Discussed plan for ongoing care management . Ms. Rigdon remains compliant with medications and treatment recommendations. Reports her home BP readings continue to fluctuate. Reports recent systolic readings have ranged from 120's to high 130's Medications updated to reflect Lyrica that was recently prescribed d/t generalized joint discomfort. She reports ambulating well and continues to exercise several times a week. She continues to attend medical appointments as scheduled. She has multiple scheduled appointments within the next few months. Denied concerns regarding transportation. Overall feels that she is doing well. She remains very motivated to improve her overall health. Will anticipate outreach within the next month to discuss/update care management needs.   Patient Self Care Activities:  . Self administers medications  . Attends scheduled provider appointments . Calls pharmacy for medication refills . Attends church or other social activities . Performs ADL's independently . Performs IADL's independently . Calls provider office for new concerns or questions   Please see past updates related to this goal by clicking on the "Past Updates" button in the selected goal           PLAN A member of the care management team will follow-up with Ms. [place] next month.    [facility] [phone]

## 2020-05-01 NOTE — Unmapped (Signed)
[facility] Clinical Assessment & Refill Coordination Note    Caitlin Smith, DOB: [birth year]  Phone: [phone] (home)     All above HIPAA information was verified with patient.     Was a Nurse, learning disability used for this call? No    Specialty Medication(s):   Inflammatory Disorders: mycophenolate     Current Outpatient Medications   Medication Sig Dispense Refill   ??? amLODIPine (NORVASC) 5 MG tablet Take 1 tablet (5 mg total) by mouth daily. 90 tablet 3   ??? aspirin (ECOTRIN) 81 MG tablet Take 81 mg by mouth daily.     ??? atorvastatin (LIPITOR) 40 MG tablet Take 40 mg by mouth daily.     ??? biotin 1 mg cap Take by mouth.     ??? buPROPion (WELLBUTRIN XL) 150 MG 24 hr tablet Take 1 tablet (150 mg total) by mouth every morning. 90 tablet 2   ??? calcipotriene (DOVONOX) 0.005 % ointment Apply every other day to areas of rash in folds as maintenance 60 g 2   ??? clobetasoL (TEMOVATE) 0.05 % ointment Apply BID to all affected area 60 g 5   ??? clobetasoL (TEMOVATE) 0.05 % ointment Apply topically nightly. 45 g 0   ??? clonazePAM (KLONOPIN) 0.5 MG tablet Take 0.5 tablets (0.25 mg total) by mouth nightly. 15 tablet 2   ??? clotrimazole-betamethasone (LOTRISONE) 1-0.05 % cream Apply 1 application topically Two (2) times a day.     ??? cyclobenzaprine (FLEXERIL) 5 MG tablet Take 1 tablet (5 mg total) by mouth two (2) times a day as needed. 60 tablet 2   ??? diclofenac sodium (VOLTAREN) 1 % gel Apply 2 g topically two (2) times a day as needed for arthritis. 300 g 5   ??? dicyclomine (BENTYL) 20 mg tablet Take 20 mg by mouth every six (6) hours.     ??? DULoxetine (CYMBALTA) 60 MG capsule Take 2 capsules (120 mg total) by mouth daily. 180 capsule 2   ??? FLOVENT HFA 110 mcg/actuation inhaler USE 2 PUFFS BY MOUTH TWO  TIMES DAILY 36 g 3   ??? fluticasone propionate (FLONASE) 50 mcg/actuation nasal spray      ??? halobetasol (ULTRAVATE) 0.05 % ointment Apply topically Two (2) times a day. 50 g 1   ??? hydrOXYchloroQUINE (PLAQUENIL) 200 mg tablet Take 1 tablet (200 mg total) by mouth Two (2) times a day. 180 tablet 3   ??? inhalational spacing device (AEROCHAMBER MV) Spcr 1 each by Miscellaneous route two (2) times a day. With Fovent 1 each 0   ??? ketoconazole (NIZORAL) 2 % cream APP EXT AA D  0   ??? lamoTRIgine (LAMICTAL) 200 MG tablet Take 1 tablet (200 mg total) by mouth Two (2) times a day. 180 tablet 2   ??? meclizine (ANTIVERT) 25 mg tablet Take 25 mg by mouth Three (3) times a day as needed.      ??? melatonin 10 mg Tab Take 1 tablet by mouth.     ??? metroNIDAZOLE (METROGEL) 0.75 % gel Apply one applicatorful (~37.5 mg metronidazole) intravaginally once daily for 5 days. 45 g 0   ??? mupirocin (BACTROBAN) 2 % ointment APP EXT IEN BID  0   ??? mycophenolate (MYFORTIC) 360 MG TbEC Take 1 tablet by mouth twice daily 180 tablet 3   ??? olopatadine (PATANOL) 0.1 % ophthalmic solution Apply to eye.     ??? omeprazole (PRILOSEC) 40 MG capsule Take 40 mg by mouth daily.      ???  ondansetron (ZOFRAN) 4 MG tablet      ??? polyethylene glycol (GLYCOLAX) 17 gram/dose powder 2 cap fulls in a full glass of water, three times a day, for 5 days.     ??? pregabalin (LYRICA) 50 MG capsule Take 1 capsule (50 mg total) by mouth Two (2) times a day. 60 capsule 11   ??? sucralfate (CARAFATE) 1 gram tablet      ??? telmisartan (MICARDIS) 80 MG tablet Take 80 mg by mouth.     ??? traZODone (DESYREL) 100 MG tablet Take 1 tablet (100 mg total) by mouth nightly. 90 tablet 2   ??? triamcinolone (KENALOG) 0.1 % ointment Apply to rash when itchy as needed, few times per week 454 g 0     No current facility-administered medications for this visit.        Changes to medications: Caitlin Smith reports no changes at this time.    Allergies   Allergen Reactions   ??? Vilazodone Swelling   ??? Ace Inhibitors Other (See Comments)     Pt states she can not take ace inhibitors. Pt states she can not remember her reaction.    ??? Bee Pollen Itching     COUGH, WATERY EYES   ??? Penicillin G Rash   ??? Penicillins Rash   ??? Pollen Extracts Itching     COUGHING, WATERY EYES       Changes to allergies: No    SPECIALTY MEDICATION ADHERENCE     mycophenolate 360mg : 10 days of medicine on hand     Medication Adherence    Patient reported X missed doses in the last month: 0  Specialty Medication: mycophenolate 360mg           Specialty medication(s) dose(s) confirmed: Regimen is correct and unchanged.     Are there any concerns with adherence? No    Adherence counseling provided? Not needed    CLINICAL MANAGEMENT AND INTERVENTION      Clinical Benefit Assessment:    Do you feel the medicine is effective or helping your condition? Yes    Clinical Benefit counseling provided? Not needed    Adverse Effects Assessment:    Are you experiencing any side effects? No    Are you experiencing difficulty administering your medicine? No    Quality of Life Assessment:    Rheumatology:   Quality of Life    On a scale of 1 ??? 10 with 1 representing not at all and 10 representing completely ??? how has your rheumatologic condition affected your:  Daily pain level?: decline to answer  Ability to complete your regular daily tasks (prepare meals, get dressed, etc.)?: decline to answer  Ability to participate in social or family activities?: decline to answer         Have you discussed this with your provider? Not needed    Therapy Appropriateness:    Is therapy appropriate? Yes, therapy is appropriate and should be continued    DISEASE/MEDICATION-SPECIFIC INFORMATION      N/A    PATIENT SPECIFIC NEEDS     - Does the patient have any physical, cognitive, or cultural barriers? No    - Is the patient high risk? Yes, patient is taking a REMS drug. Medication is dispensed in compliance with REMS program    - Does the patient require a Care Management Plan? No     - Does the patient require physician intervention or other additional services (i.e. nutrition, smoking cessation, social work)? No  SHIPPING     Specialty Medication(s) to be Shipped: Inflammatory Disorders: mycophenolate 360mg     Other medication(s) to be shipped: No additional medications requested for fill at this time     Changes to insurance: No    Delivery Scheduled: Yes, Expected medication delivery date: 05/06/2020.     Medication will be delivered via Next Day Courier to the confirmed prescription address in [facility].    The patient will receive a drug information handout for each medication shipped and additional FDA Medication Guides as required.  Verified that patient has previously received a Conservation officer, historic buildings.    All of the patient's questions and concerns have been addressed.    [name] Keshayla Schrum   [facility] Specialty Pharmacist

## 2020-05-04 ENCOUNTER — Encounter: Admit: 2020-05-04 | Discharge: 2020-05-05 | Payer: MEDICARE

## 2020-05-04 DIAGNOSIS — I129 Hypertensive chronic kidney disease with stage 1 through stage 4 chronic kidney disease, or unspecified chronic kidney disease: Secondary | ICD-10-CM | POA: Diagnosis not present

## 2020-05-04 DIAGNOSIS — Z803 Family history of malignant neoplasm of breast: Secondary | ICD-10-CM | POA: Diagnosis not present

## 2020-05-04 DIAGNOSIS — E1122 Type 2 diabetes mellitus with diabetic chronic kidney disease: Secondary | ICD-10-CM | POA: Diagnosis not present

## 2020-05-04 DIAGNOSIS — Z1231 Encounter for screening mammogram for malignant neoplasm of breast: Secondary | ICD-10-CM | POA: Diagnosis not present

## 2020-05-04 DIAGNOSIS — N181 Chronic kidney disease, stage 1: Secondary | ICD-10-CM | POA: Diagnosis not present
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
## 2020-05-05 MED FILL — MYCOPHENOLATE SODIUM 360 MG TABLET,DELAYED RELEASE: 90 days supply | Qty: 180 | Fill #0 | Status: AC

## 2020-05-06 NOTE — Patient Instructions (Addendum)
  Thank you for allowing the Chronic Care Management team to participate in your care.   Goals Addressed            This Visit's Progress   . COMPLETED: Chronic Disease Management       CARE PLAN ENTRY (see longitudinal plan of care for additional care plan information)  Current Barriers:  . Chronic Disease Management support and education needs related to Hypertension, Asthma, GERD, Chronic Kidney Disease, Dyslipidemia and Systemic lupus erythematosus (lung involvement)  Nurse Case Manager Clinical Goal(s):  Marland Kitchen Over the next 90 days, patient will not require hospitalization or emergent evaluation d/t chronic illnesses. . Over the next 90 days, patient will attend all medical appointments. . Over the next 90 days, patient will take all medications as prescribed. . Over the next 90 days, patient will weigh at least once a week and record readings. . Over the next 90 days, patient will monitor blood pressure daily and record readings. . Over the next 90 days, patient will follow recommended safety measures to prevent falls and injuries. . Over the next 10 days, patient will work with Care Guides to discuss options for transportation assistance.  Interventions:  . Inter-disciplinary care team collaboration (see longitudinal plan of care) . Discussed plan for ongoing care management . Ms. Jurich remains compliant with medications and treatment recommendations. Reports her home BP readings continue to fluctuate. Reports recent systolic readings have ranged from 120's to high 130's Medications updated to reflect Lyrica that was recently prescribed d/t generalized joint discomfort. She reports ambulating well and continues to exercise several times a week. She continues to attend medical appointments as scheduled. She has multiple scheduled appointments within the next few months. Denied concerns regarding transportation. Overall feels that she is doing well. She remains very motivated to  improve her overall health. Will anticipate outreach within the next month to discuss/update care management needs.   Patient Self Care Activities:  . Self administers medications  . Attends scheduled provider appointments . Calls pharmacy for medication refills . Attends church or other social activities . Performs ADL's independently . Performs IADL's independently . Calls provider office for new concerns or questions   Please see past updates related to this goal by clicking on the "Past Updates" button in the selected goal        Ms. [place] verbalized understanding of the information discussed during the telephonic outreach today. Declined need for a mailed/printed copy of the instructions.    A member of the care management team will follow-up with Ms. [place] next month.     C[facility] Care [facility] [phone]

## 2020-05-06 NOTE — Unmapped (Signed)
Her mood feels a little better than she was. She reports amelioration of her anxiety, that she is not as anxious as she was last week, and attributes this to going to [employer] game and being around a lot of people.     We discuss leaving medications as is at this point.

## 2020-05-06 NOTE — Unmapped (Signed)
Called patient. Having lots of myalgia. No fevers. Symptoms started three weeks ago. She started Lyrica on 04/06/2020. Currently on 100 mg daily. Symptoms started a week after initiating medication and titrating up. Advised her taper down on Lyrica to 50 mg daily for 3-5 days and then stop medication to see if this a medication side effect. Otherwise, if develop other symptoms to reach out and to also follow-up with PCP. Advised her to discuss with pain management the Lyrica as well. She appreciated phone call.

## 2020-05-13 ENCOUNTER — Encounter
Admit: 2020-05-13 | Discharge: 2020-05-14 | Payer: MEDICARE | Attending: Student in an Organized Health Care Education/Training Program | Primary: Student in an Organized Health Care Education/Training Program

## 2020-05-13 NOTE — Unmapped (Signed)
[facility]  Psychiatry Telehealth Encounter  Established Patient - Video Visit    I spent 21 minutes on the real-time audio and video with the patient. I spent an additional 10 minutes on pre- and post-visit activities.     The patient was physically located in [place] or a state in which I am permitted to provide care. The patient and/or parent/guardian understood that s/he may incur co-pays and cost sharing, and agreed to the telemedicine visit. The visit was reasonable and appropriate under the circumstances given the patient's presentation at the time.    The patient and/or parent/guardian has been advised of the potential risks and limitations of this mode of treatment (including, but not limited to, the absence of in-person examination) and has agreed to be treated using telemedicine. The patient's/patient's family's questions regarding telemedicine have been answered.     If the visit was completed in an ambulatory setting, the patient and/or parent/guardian has also been advised to contact their provider???s office for worsening conditions, and seek emergency medical treatment and/or call 911 if the patient deems either necessary.    Assessment:  Caitlin Smith is a 64 y.o. female with a history of MDD, GAD, and Panic Disorder in the context of many chronic medical conditions including chronic pain/fibromyalgia, osteoarthritis with degenerative disc disease, Lupus, pituitary tumor (benign), s/p right total knee arthroplasty, and VIN II (s/p resection), who presents for follow-up evaluation. She has been maintained on Lamictal for many years as well as cymbalta, trazodone, klonopin, and wellbutrin. Patient has previously tolerated a slight taper in her klonopin from 0.75 mg to 0.25 mg qHS overtime. Her mood is often exacerbated by steroid use for current treatment of pulmonary disease. Patient is engaged with CBT therapy at the pain clinic.    Met Caitlin Smith today 05/13/20 for epic video-visit. She reports betterment of mood, especially after a trip to [place] with her family to watch her Grandaughter's softball tournament. She continues to take medications as prior; she is on 150mg  of wellbutrin as opposed to the 300mg  and feels that this maybe has been helpful in decreasing anxiety. She notes that she was started on lyrica before and felt that tapering off it recently has helped her mood greatly.Taper of trazodone has been unsuccessful at this point; she continues on 150mg . Will follow-up in 2-3 months.    For Next Visit:  [ ]  Discuss mood, depression  [ ]  Continue to invite discussion of tapering medications if patient feels stable    Risk Assessment:  A full suicide and violence risk assessment was performed as part of this patient's initial evaluation with [facility] outpatient psychiatry.  There is no new acute risk for suicide or violence at this time. The patient was educated about relevant modifiable risk factors including following recommendations for treatment of psychiatric illness and abstaining from substance abuse.  While future psychiatric events cannot be accurately predicted, the patient does not currently require acute inpatient psychiatric care and does not currently meet [facility] involuntary commitment criteria.    Current Medication Regimen:  -- Lamictal 400mg  qhs  -- Cymbalta 120mg  qAM   -- Wellbutrin XL 150mg  qAM   -- Clonazepam 0.25mg  qHS,   -- Trazodone 150mg  at bedtime     Plan:    # Depression - Anxiety - Panic Disorder  Status: Active, improved from prior visit  - Continue Lamictal 400mg  qhs for mood  - Continue Cymbalta 120mg  qAM to target depressive sx and chronic pain   --  Could consider switching to effexor should depressive symptoms worsen  - Continue Wellbutrin XL 150mg  qAM    --Might consider this again as target for taper which would be relatively simple in future  - Continue Clonazepam 0.25mg  qHS, appropriate filling per NCCSRS  - Continue CBT at pain clinic; advised her to see if therapist willing to talk some about childhood, which is a stressor for patient   - Family excursions/engagement are really helpful to Caitlin Smith who values family    # Insomnia: Last sleep study performed in 2017. Per chart review, showed mild OSA likely related to medication use. Neurology recommends patient avoid sleeping on back as she likely has OSA when sleeping in this position    Status: Active, Unable to continue taper of trazodone; patient self-titrated back to 150mg    --Continue melatonin 3-5mg  at bedtime   -- Continue trazodone 150mg  qHS for insomnia (d5/4/21, d 02/05/20, i9/8)  - Clonazepam per above    # Mild Neurocognitive Impairment: Patient seen by Neurology ([facility]) 05/22/18 who dx pt with mild neurocognitive impairment w/ deficits in inattention and visual-spatial function with suspicion that sleep apnea and polypharmacy may be contributing to her difficulties. Recommended that psychopharm regimen be simplified, including eliminating trazodone and considering changing Wellbutrin to Zoloft or Lexapro for anxiety and eliminating Klonopin when stable.     Status: Ongoing, no new testing as of 11/2019, we have not been able to decrease clonazepam, wellbutrin or trazodone. Pandemic and social atmosphere make changes in pharm more challenging at this time  - Tapering trazodone at this visit  - However, it is very likely that patient's lupus is significantly contributing to cognitive decline, which will limit improvement she will see with reduction in polypharmacy  --Last MOCA 22 on 12/12/2017    # Medical Monitoring - High Risk Medication Use  Status: CBC/Liver function WNL as of 03/2020  - The complexity of this patient's care involves drug therapy which requires intensive monitoring for toxicity. This is in part due to a narrow therapeutic window, as well as potential for toxicity. This patient is being treated with Lamotrigine (Lamictal) to target their psychiatric disorder, Major depressive disorder, refractory. In regards to this medication being considered high risk, Lamotrigine (lamictal) has black box warning for serious rash including fatal reaction of Stevens-Johnson syndrome and rare cases of toxic epidermal necrolysis. In addition to prolonged titration to mitigate risk of serious rash, lamotrigine requires monitoring of hepatic and renal function at baseline, and at times will require monitoring of lamotrigine blood levels.    Lamotrigine Monitoring  - Hepatic function testing, platelets (via CBC) q6 months  -AST/ALT WNL     Lab Results   Component Value Date    WBC 4.5 04/02/2020    HGB 13.2 04/02/2020    HCT 40.9 04/02/2020    PLT 334 04/02/2020       Lab Results   Component Value Date    NA 141 11/09/2019    K 4.3 11/09/2019    CL 100 11/09/2019    CO2 29.0 11/09/2019    BUN 13 11/09/2019    CREATININE 0.90 (H) 04/02/2020    GLU 74 11/09/2019    CALCIUM 9.8 11/09/2019    MG 2.1 12/31/2013    PHOS 4.9 (H) 12/31/2013       Lab Results   Component Value Date    BILITOT 0.7 11/09/2019    BILIDIR <0.10 05/01/2018    PROT 7.3 04/02/2020    ALBUMIN 3.6  04/02/2020    ALT 23 04/02/2020    AST 24 04/02/2020    ALKPHOS 98 11/09/2019    GGT 19 08/26/2012       Lab Results   Component Value Date    PT 12.0 09/17/2012    INR 1.11 01/03/2014    APTT 30.8 01/03/2014     # Return to Clinic: 1 month    Psychotherapy:  No billable psychotherapy service provided but brief supportive therapy was utilized.    Patient has been given this writer's contact information as well as the [facility] Psychiatry urgent line number. The patient has been instructed to call 911 for emergencies.    Patient and plan of care were discussed with the Attending MD,Dr. Cecilio Asper, who agrees with the above statement and plan.     Connye Burkitt MD    Subjective:     Psychiatric Chief Concern:  Follow-up psychiatric evaluation for depression, anxiety and insomnia.     Interval History:     Caitlin Smith is tapering off lyrica which she feels like affected her mood for the worse. She is on the 150mg  of wellbutrin which she feels is a better fit than the 300, when her anxiety was worse. She had an excellent weekend; she went down to [place] to watch her Granddaughter in a softball tournament! She was encouraged by getting to spend time with her family and meet other people. She feels optimistic overall, that she is coming out of the down mood she had before. She denies any suicidal thinking.     Social History: reviewed; patient recently moved apartments within the same complex    ROS:  As per Interval History and:  Constitutional:  no significant appetite change. Anxiety, dysthymia bettered since we talked last week  Neuro:  none / negative    Objective:    Mental Status Exam:   Appearance  Well-appeaing, INAD       Speech/Language:    Normal rate, volume, tone, fluency and Language intact, well formed   Affect  Mildly anxious, mood congruent   Mood:   much better   Thought process and Associations:   Logical, linear, clear, coherent, goal directed   Abnormal/psychotic thought content:     Denies SI, HI, self harm, delusions, obsessions, paranoid ideation, or ideas of reference   Perceptual disturbances:     Does not endorse auditory or visual hallucinations     Orientation:   Oriented to person, place, time, and general circumstances   Insight:     Intact   Judgment:    Intact   Impulse Control:   Intact     Physical Exam: UTA phone visit    Medications: reviewed at today's visit    Psychometrics:  Psych Scale Scores - Adult      Office Visit from 09/04/2018 in [facility] PSYCHIATRY Transplant AT [facility]   **PHQ-9: Severity Measure for DEPRESSION Total Score**  20 Collected on 09/04/2018 0000   WHODAS 2.0 (Self-administered) - Total Score  42 Collected on 09/04/2018 0000          Posey Rea, MD.

## 2020-05-15 MED ORDER — SUCRALFATE 1 GRAM TABLET
ORAL_TABLET | 0 refills | 0 days
Start: 2020-05-15 — End: ?

## 2020-05-15 NOTE — Unmapped (Signed)
[facility]  Psychiatry Encounter  Established Patient - In person visit    Assessment:  Caitlin Smith is a 63 y.o. female with a history of MDD, GAD, and Panic Disorder in the context of many chronic medical conditions including chronic pain/fibromyalgia, osteoarthritis with degenerative disc disease, Lupus, pituitary tumor (benign), s/p right total knee arthroplasty, and VIN II (s/p resection), who presents for follow-up evaluation. She has been maintained on Lamictal for many years as well as cymbalta, trazodone, klonopin, and wellbutrin. Patient has previously tolerated a slight taper in her klonopin from 0.75 mg to 0.25 mg qHS overtime. Her mood is often exacerbated by steroid use for current treatment of pulmonary disease. Patient is engaged with CBT therapy at the pain clinic.    Met Caitlin Smith today for a face to face visit.  She continues to report diminished mood, stating that she feels it has been the worst for a while.  Of note, she is having increased motor activation.  We discuss recent medication changes, including our titration up to 300mg  of wellbutrin from 150mg . We decide at today's visit to decrease bupropion back to 150mg  since it doesn't seem to be helping. Other external stressors that are affecting her mood include feeling like her grandchildren are too involved in their computers to talk to her, and feeling isolated at home. She denies suicidal thinking and contracts for safety. Will follow-up closely in 2 weeks given her acute changes in mood.     For Next Visit:  [ ]  Discuss mood, depression  [ ]  Discuss any changes in anxiety level with lower dose bupropion  [ ]  Consider TSH recheck in future    Risk Assessment:  A full suicide and violence risk assessment was performed as part of this patient's initial evaluation with [facility] outpatient psychiatry.  There is no new acute risk for suicide or violence at this time. The patient was educated about relevant modifiable risk factors including following recommendations for treatment of psychiatric illness and abstaining from substance abuse.  While future psychiatric events cannot be accurately predicted, the patient does not currently require acute inpatient psychiatric care and does not currently meet [facility] involuntary commitment criteria.    Current Medication Regimen:  -- Lamictal 400mg  qhs   -- Cymbalta 120mg  qAM   -- Wellbutrin XL 150mg  qAM (Tried increase to 300mg  where Caitlin Smith psychomotor agitation)  -- Clonazepam 0.25mg  qHS, appropriate filling per NCCSRS   -- Trazodone 150mg  at bedtime (Tried to decrease but noticed inability to fall asleep, even with addition of melatonin)    Plan:    # Depression - Anxiety - Panic Disorder  Status: Moderate worsening for 2 weeks per patient report  - Continue Lamictal 400mg  qhs for mood  - Continue Cymbalta 120mg  qAM to target depressive sx and chronic pain  - DECREASE Wellbutrin XL back to 150mg  qAM   - Continue Clonazepam 0.25mg  qHS, appropriate filling per NCCSRS  - Continue CBT at pain clinic; advised her to see if therapist willing to talk some about childhood, which is a stressor for patient   - Next steps: Could consider cross-titrating to effexor off of cymbalta     # Insomnia: Last sleep study performed in 2017. Per chart review, showed mild OSA likely related to medication use. Neurology recommends patient avoid sleeping on back as she likely has OSA when sleeping in this position    Status: Stable, improved. Would like to continue to work to  decrease polypharmacy in future, may focus on trazodone, especially as patient has found positive result in melatonin. Have attempted to go to 100mg  but patient self-titrated back to 1.5 tabs  --Continue melatonin 3-5mg  at bedtime   -- Continue trazodone 150mg  qHS for insomnia (d5/4/21, d 02/05/20, i8/2021)  - Clonazepam per above    # Mild Neurocognitive Impairment: Patient seen by Neurology ([facility]) 05/22/18 who dx pt with mild neurocognitive impairment w/ deficits in inattention and visual-spatial function with suspicion that sleep apnea and polypharmacy may be contributing to her difficulties. Recommended that psychopharm regimen be simplified, including eliminating trazodone and considering changing Wellbutrin to Zoloft or Lexapro for anxiety and eliminating Klonopin when stable.     Status: Ongoing, no new testing as of 11/2019, we have not been able to decrease clonazepam or discontinue wellbutrin. Pandemic and social atmosphere make changes in pharm more challenging at this time; attempting to target trazodone first given improved sleep  - Tapering trazodone at this visit  - However, it is very likely that patient's lupus is significantly contributing to cognitive decline, which will limit improvement she will see with reduction in polypharmacy  --Last MOCA 22 on 12/12/2017    # Medical Monitoring - High Risk Medication Use  - The complexity of this patient's care involves drug therapy which requires intensive monitoring for toxicity. This is in part due to a narrow therapeutic window, as well as potential for toxicity. This patient is being treated with Lamotrigine (Lamictal) to target their psychiatric disorder, Major depressive disorder, refractory. In regards to this medication being considered high risk, Lamotrigine (lamictal) has black box warning for serious rash including fatal reaction of Stevens-Johnson syndrome and rare cases of toxic epidermal necrolysis. In addition to prolonged titration to mitigate risk of serious rash, lamotrigine requires monitoring of hepatic and renal function at baseline, and at times will require monitoring of lamotrigine blood levels.    Lamotrigine Monitoring  - Hepatic function testing, platelets (via CBC) q6 months  -AST/ALT WNL in 03/2020    Lab Results   Component Value Date    WBC 4.5 04/02/2020    HGB 13.2 04/02/2020    HCT 40.9 04/02/2020    PLT 334 04/02/2020 Lab Results   Component Value Date    NA 141 11/09/2019    K 4.3 11/09/2019    CL 100 11/09/2019    CO2 29.0 11/09/2019    BUN 13 11/09/2019    CREATININE 0.90 (H) 04/02/2020    GLU 74 11/09/2019    CALCIUM 9.8 11/09/2019    MG 2.1 12/31/2013    PHOS 4.9 (H) 12/31/2013       Lab Results   Component Value Date    BILITOT 0.7 11/09/2019    BILIDIR <0.10 05/01/2018    PROT 7.3 04/02/2020    ALBUMIN 3.6 04/02/2020    ALT 23 04/02/2020    AST 24 04/02/2020    ALKPHOS 98 11/09/2019    GGT 19 08/26/2012       Lab Results   Component Value Date    PT 12.0 09/17/2012    INR 1.11 01/03/2014    APTT 30.8 01/03/2014     # Return to Clinic: 1 month    Psychotherapy:  No billable psychotherapy service provided but brief supportive therapy was utilized.    Patient has been given this writer's contact information as well as the [facility] Psychiatry urgent line number. The patient has been instructed to call 911 for emergencies.  Patient was seen and plan of care were discussed with the Attending MD,Dr. Cecilio Asper, who agrees with the above statement and plan.     Connye Burkitt MD    Subjective:     Psychiatric Chief Concern:  Follow-up psychiatric evaluation for depression, anxiety and insomnia.     Interval History: Met with Caitlin Smith in person today. She feels that her depression has Smith to a more severe degree than usual. In particular, she cites worries about isolation. She also notes that her grandchildren are not as affectionate or interested in her visits as they once were. This is distressing to her as they are a large part of her life. She also notes that her pain has increased which is another factor in her mood. We work together to try to brainstorm ways to get involved. After the pandemic, she might get more involved with her church but can't right now because of the delta surge. We agree to try to decrease wellbutrin to 150mg  because it seems it may have Smith the anxiety she is feeling. Today she is notably anxious during our interview, tearing up a tissue. Her car is working, which is helpful to her and is why she was able to come visit today. She endorses adequate, restful sleep with the combination of 150mg  of trazodone and melatonin. She denies changes in appetite or energy. She reports feeling better after talking. We agree today to decrease the wellbutrin dose and leave other things the same. Our next step would be to cross-titrate from cymbalta to effexor, potentially.     Social History: reviewed and updated; no changes today    ROS:  As per Interval History and:  Constitutional:  no significant appetite change. Anxiety, dysthymia Smith  Neuro:  none / negative    Objective:    Mental Status Exam:   Appearance  Anxious distress, well-nourished and well-developed   Behavior  Psychomotor agitated, tearing at a tissue throughout the entire conversation.       Speech/Language:    Normal rate, volume, tone, fluency and Language intact, well formed   Affect  Anxious and tearful, calm at times   Mood:   I haven't felt this bad in a while   Thought process and Associations:   Logical, linear, clear, coherent, goal directed   Abnormal/psychotic thought content:     Denies SI, HI, self harm, delusions, obsessions, paranoid ideation, or ideas of reference   Perceptual disturbances:     Does not endorse auditory or visual hallucinations     Orientation:   Oriented to person, place, time, and general circumstances   Insight:     Intact   Judgment:    Intact   Impulse Control:   Intact     Physical Exam:   Gen: Anxious distress  Resp: Increased WOB when tearful  Ext: Full ROM   Neuro: No gross neurologic deficits, walks with a limp    Medications: reviewed at today's visit    Psychometrics:  Psych Scale Scores - Adult      Office Visit from 09/04/2018 in [facility] PSYCHIATRY Transplant AT [facility]   **PHQ-9: Severity Measure for DEPRESSION Total Score**  20 Collected on 09/04/2018 0000   WHODAS 2.0 (Self-administered) - Total Score  42 Collected on 09/04/2018 0000          Posey Rea, MD

## 2020-05-15 NOTE — Unmapped (Signed)
I saw and evaluated the patient, participating in the key portions of the service.  I reviewed the resident’s note.  I agree with the resident’s findings and plan.

## 2020-05-27 ENCOUNTER — Ambulatory Visit: Admit: 2020-05-27 | Discharge: 2020-05-28 | Payer: MEDICARE | Attending: Dermatology | Primary: Dermatology

## 2020-05-27 DIAGNOSIS — L409 Psoriasis, unspecified: Secondary | ICD-10-CM | POA: Diagnosis not present

## 2020-05-27 MED ORDER — TRIAMCINOLONE ACETONIDE 0.1 % TOPICAL OINTMENT
INTRAMUSCULAR | 1 refills | 0.00000 days | Status: CP
Start: 2020-05-27 — End: ?

## 2020-05-27 NOTE — Unmapped (Signed)
Patient Education        Psoriasis: Care Instructions  Overview  Psoriasis (say suh-RY-uh-sus) is a long-term skin problem that causes thick, white, silvery, or red patches on the skin. The patches may be small or large, and they occur most often on the knees, elbows, scalp, hands, feet, or lower back.  The skin may be scaly. If the condition is severe, your skin can become itchy and tender. Psoriasis also can be embarrassing if the patches are on visible areas.  You can treat psoriasis with good care at home and with medicine from your doctor. You may put medicine on your skin and take pills or have shots to stop the redness and swelling. Your doctor also may suggest ultraviolet light treatments.  Follow-up care is a key part of your treatment and safety. Be sure to make and go to all appointments, and call your doctor if you are having problems. It's also a good idea to know your test results and keep a list of the medicines you take.  How can you care for yourself at home?  ?? If your doctor prescribes medicine, use it exactly as prescribed. Follow your doctor's advice for sunlight or ultraviolet light treatment. Call your doctor if you think you are having a problem with your medicine.  ?? Protect your skin:  ? Keep your skin moist. After bathing, put an ointment, cream, or lotion on your skin while it is still damp. This seals in moisture. Use over-the-counter products that your doctor suggests. These may include Cetaphil, Lubriderm, or Eucerin. Petroleum jelly (such as Vaseline) and vegetable shortening (such as Crisco) also work.  ? If you have psoriasis on your scalp, use a shampoo with salicylic acid, such as Neutrogena T/Sal.  ? Avoid harsh skin products, such as those that contain alcohol.  ? Try to prevent sunburn. Although short periods of sun exposure reduce psoriasis in most people, too much sun can damage the skin and cause skin cancer. In addition, sunburns can trigger psoriasis. Use sunscreen on areas of your skin that do not have psoriasis. Make sure to use a broad-spectrum sunscreen that has a sun protection factor (SPF) of 30 or higher. Use it every day, even when it is cloudy.  ? Take care to avoid accidents such as cutting or scraping your skin. An injury to the skin can cause psoriasis patches to form anywhere on the body, including the area of the injury.  ?? Try making one or more changes to your daily habits to help with managing your psoriasis. For example:  ? Try to control stress and anxiety. They may cause psoriasis to appear suddenly or can make symptoms worse.  ? If you smoke, think about quitting. If you need help quitting, talk to your doctor about stop-smoking programs and medicines.  ? If you drink, limit or reduce the amount of alcohol you drink.  ? If you are overweight, see if you can lose some weight.  ?? Seek support from family and friends. Talk to a counselor or other professional if you feel sad about your condition and need more help.  When should you call for help?   Call your doctor now or seek immediate medical care if:  ?? ?? You have signs of infection, such as:  ? Increased pain, swelling, warmth, or redness.  ? Red streaks leading from the area.  ? Pus draining from the area.  ? A fever.   Watch closely for changes in your health,  and be sure to contact your doctor if:  ?? ?? You have swelling, stiffness, or pain in your joints.   ?? ?? You do not get better as expected.   Where can you learn more?  Go to [facility] at https://myuncchart.org  Select Patient Education under [employer]. Enter [id number] in the search box to learn more about Psoriasis: Care Instructions.  Current as of: November 06, 2019??????????????????????????????Content Version: 13.0  ?? 2006-2021 [employer].   Care instructions adapted under license by [facility]. If you have questions about a medical condition or this instruction, always ask your healthcare professional. [employer] Inc[employer] disclaims any warranty or liability for your use of this information.

## 2020-05-27 NOTE — Unmapped (Signed)
DERMATOLOGY FOLLOW-UP NOTE    Assessment and Plan:    Inverse psoriasis - well controlled w/ topical steroids  - Biopsy c/w psoriasis  - Would continue try to avoid prednisone in the future if possible as this can flare psoriasis.   - Will continue to treat with topicals below.   - some evidence of atrophy related to use of halobetasol - will switch to tac ointment. Discussed use on the weekends or when flaring (not daily if not flaring)  -start vaseline  - Consider adding an oral agent if she continues to flare at next visit- could consider [facility] (although has mood d/o).   - Of note, she is not a good candidate for MTX given history of eosinophilic pneumonitis- rheumatology and pulmonology agree that this would not be a suitable option for her given her pulmonary history.        RTC 1 year  ______________________________________________________________________    CC:    Chief Complaint   Patient presents with   ??? Eczema     LOCATED ALL OVER WAS DOING GOOD STARTING TO GET WORST AND USING THE CREAMS DOESNT HELP    ??? Medication Refill     PT STATED SHE WANTED TO WAIT TO SEE WHAT THE DR SAYS         HPI:  This is a pleasant 64 y.o. female last seen a year ago who presents today for  Psoriasis. Since she was last seen it is much improved with the halobetasol but continues to get intermittent flares. Has been using the halobetasol daily. Also notes that her itching is improved with good moisturizing and dry skin habits. Denies other new, changing, non-healing, tender, or bleeding lesions.  No other skin concerns.        Final Diagnosis   Date Value Ref Range Status   04/24/2020   Final    A: Vulva, right, biopsy  - Mild dermal chronic inflammation, acanthosis, hyperkeratosis, parakeratosis, and spongiosis (see comment)  - No dysplasia or carcinoma identified    This electronic signature is attestation that the pathologist personally reviewed the submitted material(s) and the final diagnosis reflects that evaluation. Pertinent PMH:  Reviewed in Epic  SLE   Eosinophilic pneumonitis   Inverse Psoriasis   She was initially diagnosed in 2006 with SLE presenting with episcleritis and arthralgia with serology showing +ANA, +dsDNA. ??She also has stage I CKD felt secondary not due to SLE but due to HTN and obesity.     ROS: Baseline state of health.  Denies fevers, chills. No other skin complaints except noted per HPI.     PE:  Gen: WD, WN, NAD, A&O  Skin: Per patient request, examination of the head, neck, chest, back, abdomen,buttocks, bilateral upper extremities,and bilateral lower extremities was performed and significant for the below. All other areas examined were normal or had no significant findings.   Skin clear, few scaly papules on the buttocks

## 2020-06-08 ENCOUNTER — Institutional Professional Consult (permissible substitution): Admit: 2020-06-08 | Discharge: 2020-06-09 | Payer: MEDICARE | Attending: Psychologist | Primary: Psychologist

## 2020-06-08 DIAGNOSIS — F411 Generalized anxiety disorder: Principal | ICD-10-CM

## 2020-06-08 DIAGNOSIS — F331 Major depressive disorder, recurrent, moderate: Principal | ICD-10-CM

## 2020-06-08 DIAGNOSIS — F410 Panic disorder [episodic paroxysmal anxiety] without agoraphobia: Principal | ICD-10-CM

## 2020-06-08 DIAGNOSIS — F4310 Post-traumatic stress disorder, unspecified: Principal | ICD-10-CM

## 2020-06-08 NOTE — Unmapped (Signed)
[facility]   Confidential Psychological Therapy Session      Patient Name: Caitlin Smith  Medical Record Number: [id number]  Date of Service: June 08, 2020  Attending Psychologist: Caroline More, PhD  CPT Procedure Codes: 82956 for 60 mins of face to face counseling    Due to the current declared state emergency during the coronavirus pandemic, all non-urgent medical and mental health visits have been triaged to either be delayed or take place virtually via phone or videoconferencing.   Due to the need for continued patient interaction for mental health care, pain management, and care coordination that necessitates the involvement of this provider, this visit was performed face to face using interactive technology using a HIPPA compliant audio/visual platform. This patient will be scheduled for face to face visits in the future once it has been deemed safe.      We reviewed confidentiality today. The patient was present at home (location and contact information confirmed), attended this visit alone, and consented to this virtual pain psychology visit.     Visit modifiers:   GT for Interactive Technology and CR for catastrophe/disaster related due to coronavirus pandemic    REFERRING PHYSICIAN: Clarene Essex, MD    CHIEF COMPLAINT AND REASON FOR VISIT: pain coping skills, CBT to address depression and anxiety in the setting of chronic pain    SUBJECTIVE / HISTORY OF PRESENT ILLNESS: Ms.  Caitlin Smith is a very pleasant 64 y.o.  female from [place], [place] with multiple chronic pain complaints related to fibromyalgia and lupus who initially met with me in October 2016, and which time she was diagnosed with severe depression, PTSD, panic disorder, and generalized anxiety.The patient returns for a therapy session today. Last follow up with me was 03/31/20.     Patient is doing fair today.  She has felt more down/depressed, attributed to some family stressors.  In general her mood has been improving and she has been spending more time with her grandchildren.  This had been a source of positivity and something to look forward to.  More recently her grandkids, several of them, have been spending most of their time on tablet computers and laptops.  They have not been interested in spending time with her and if they come to her house or she comes over they are buried in their electronics.  The patient had tried to address the concern by talking to her son and daughter.  Both allow the kids and limited access to electronics on the weekends, none during the week, so on the weekends the kids see this as something to look forward to.  Both did not want to take away computers even when the kids were visiting their grandmother.  The patient reported feeling lonely, having less to look forward to.  Today we brainstormed social activities she could do with friends.  Also reviewed problem solving, identifying activities she could put forth that could not be done with electronics.  She agrees to tried to invite her children and grandchildren to do these activities with her.  Spent extra time practicing mindful breathing and body scan, drawing awareness to how she experiences negative emotion in her body.  Follow-up in 2 weeks to reassess    OBJECTIVE / MENTAL STATUS:    Appearance:   Appears stated age and Clean/Neat   Motor:  No abnormal movements   Speech/Language:   Normal rate, volume, tone, fluency   Mood:  Depressed and Anxious   Affect:  blunted   Thought process:  Logical, linear, clear, coherent, goal directed   Thought content:    Denies SI, HI, self harm, delusions, obsessions, paranoid ideation, or ideas of reference   Perceptual disturbances:    Denies auditory and visual hallucinations, behavior not concerning for response to internal stimuli   Orientation:  Oriented to person, place, time, and general circumstances   Attention:  Able to fully attend without fluctuations in consciousness Concentration:  Able to fully concentrate and attend   Memory:  Immediate, short-term, long-term, and recall grossly intact    Fund of knowledge:   Consistent with level of education and development   Insight:    Fair   Judgment:   Intact   Impulse Control:  Intact     DIAGNOSTIC IMPRESSION:   Post Traumatic Stress Disorder (PTSD)  Generalized Anxiety Disorder (GAD)  Panic Disorder  Major Depressive Disorder, moderate, recurrent  Chronic pain syndrome  Fibromyalgia  Lupus    ASSESSMENT:   Ms.  Caitlin Smith is a very pleasant 64 y.o.  female from [place], [place] with multiple chronic pain complaints related to lupus, arthritis, and fibromyalgia. She was previously seen in our clinic from 2012-2014, and reestablished care with Dr. Fayrene Fearing in August 2016. The patient is struggling with depression and anxiety, but is very motivated to participate in intensive, multidisciplinary treatment to address the connection between depression, anxiety and pain. She has a long-standing history of depression and anxiety, and has worked with outpatient psychiatrist and therapist over the years. She is currently established with [facility] psychiatry, and is tapering off of Klonopin.  Depression, anxiety, and panic have all improved.  Has been diagnosed with diabetes, and is managing it well with p.o. medication and dietary changes.  Had lost 50+ pounds but regained 20 lbs recently.  Patient presents with low-grade depression related to COVID and some social isolation.       PLAN:   (1) Psychotherapy - Continue CBT. CBT will be used to address PTSD, MDD, Panic D/o, GAD, and chronic pain.     --Behavioral Activation - Encouraged behavioral activation.  Is doing great, walking 5 days/week  --Downloaded and uses Insight Timer, guided relaxation app, for nightly practice.  --Continues to utilize diaphragmatic breathing daily    (2) Psychiatry - Pt currently established with [facility].  - to follow up with her psychiatrist and discuss low mood despite reduced stress and increased behavioral activation. To ask about possibility of adjusting mood stabilizer (Lamictal) versus other possible changes to help improve mood. (in psychotherapy will cont beh activation and identified need for increased socialization - this has changed post covid related school changes, is alone much more these days with her granddaughter back in school)    (3) Safety - Pt denies any current or recent SI or safety concerns. Knows to call 911 or go to her local ED. Also previously given [facility].      (4) follow-up - with me in 2 weeks

## 2020-06-12 ENCOUNTER — Other Ambulatory Visit: Payer: Self-pay | Admitting: Family Medicine

## 2020-06-12 MED ORDER — VENLAFAXINE ER 37.5 MG CAPSULE,EXTENDED RELEASE 24 HR
ORAL_CAPSULE | Freq: Every day | ORAL | 0 refills | 60.00000 days | Status: CP
Start: 2020-06-12 — End: ?

## 2020-06-12 NOTE — Telephone Encounter (Signed)
Copied from [place] [phone] [phone]. Topic: Quick Communication - Rx Refill/Question >> Jun 12, 2020 10:36 AM Leward Quan A wrote: Medication: atorvastatin (LIPITOR) 40 MG tablet  Need 30 day sent here rest to mail order pharmacy  Has the patient contacted their pharmacy? Yes.   (Agent: If no, request that the patient contact the pharmacy for the refill.) (Agent: If yes, when and what did the pharmacy advise?)  Preferred Pharmacy (with phone number or street name): [id number] [address], [place]  Phone:  [phone] Fax:  [phone]     Agent: Please be advised that RX refills may take up to 3 business days. We ask that you follow-up with your pharmacy.

## 2020-06-12 NOTE — Telephone Encounter (Signed)
Requested medication (s) are due for refill today: Yes  Requested medication (s) are on the active medication list: Yes  Last refill:  05/28/19  Future visit scheduled: Yes  Notes to clinic:  Prescription has expired.    Requested Prescriptions  Pending Prescriptions Disp Refills   atorvastatin (LIPITOR) 40 MG tablet [Pharmacy Med Name: Atorvastatin Calcium 40 MG Oral Tablet] 90 tablet 3    Sig: TAKE 1 TABLET BY MOUTH  DAILY      Cardiovascular:  Antilipid - Statins Failed - 06/12/2020 10:18 AM      Failed - Total Cholesterol in normal range and within 360 days    Cholesterol, Total  Date Value Ref Range Status  05/06/2015 178 100 - 199 mg/dL Final   Cholesterol  Date Value Ref Range Status  02/07/2019 143 <200 mg/dL Final          Failed - LDL in normal range and within 360 days    LDL Cholesterol (Calc)  Date Value Ref Range Status  02/07/2019 69 mg/dL (calc) Final    Comment:    Reference range: <100 . Desirable range <100 mg/dL for primary prevention;   <70 mg/dL for patients with CHD or diabetic patients  with > or = 2 CHD risk factors. Marland Kitchen LDL-C is now calculated using the Martin-Hopkins  calculation, which is a validated novel method providing  better accuracy than the Friedewald equation in the  estimation of LDL-C.  Cresenciano Genre et al. Annamaria Helling. 6294;765(46): 2061-2068  (http://education.[facility].com/faq/FAQ164)           Failed - HDL in normal range and within 360 days    HDL  Date Value Ref Range Status  02/07/2019 63 > OR = 50 mg/dL Final  05/06/2015 56 >39 mg/dL Final    Comment:    According to ATP-III Guidelines, HDL-C >59 mg/dL is considered a negative risk factor for CHD.           Failed - Triglycerides in normal range and within 360 days    Triglycerides  Date Value Ref Range Status  02/07/2019 39 <150 mg/dL Final          Passed - Patient is not pregnant      Passed - Valid encounter within last 12 months    Recent Outpatient  Visits           3 months ago Benign hypertension   [facility] Steele Sizer, MD   7 months ago Right ear pain   [facility] Steele Sizer, MD   7 months ago Right ear pain   [facility], Drue Stager, MD   8 months ago Diabetes mellitus type 2 in obese [facility])   [facility] Steele Sizer, MD   11 months ago Acute right-sided low back pain with right-sided sciatica   [facility] Steele Sizer, MD       Future Appointments             In 3 days Steele Sizer, MD [facility], [facility]

## 2020-06-12 NOTE — Progress Notes (Signed)
Name: Nicole Ballard   MRN: [id number]    DOB: August 11, 1956   Date:06/15/2020       Progress Note  Subjective  Chief Complaint  Chief Complaint  Patient presents with  . Follow-up  . Hypertension  . Diabetes    HPI  HTN:She hasrecurrentchest pain and has seen cardiologist, she states chest pain not as frequent now, she had a stress test done [facility] that was normal, bp today is at goal today , she has not been checking bp at home lately. She was getting more anxious when checking bp all the time. Continue Norvasc 5 mg and Telmisartan 80 mg   INTERPRETATION( done 02/2019 ) Normal Stress Echocardiogram NORMAL RIGHT VENTRICULAR SYSTOLIC FUNCTION TRIVIAL REGURGITATION NOTED  NO VALVULAR STENOSIS NOTED  DM: denies polyphagia, polyuria or polydipsia, she has gradually gaining weight, she states she is a stress eater. Today  A1C 5.5% . She is back at the gym and eating healthier but has not been losing weight as expected. Discussed keeping healthier snacks at home.  MDD/Mild Neurocognitive Impairment: under the care of psychiatrist, Dr. Valere Dross at [facility] psychiatry. She contacted her last week because depression not improving and medication is getting switched from Duloxetine 120 mg daily to 60 mg and transition to Effexor starting at a low dose of 37.5 mg in am's. Patient has not picked up rx yet, and has a follow up with psychiatrist this week  Dyslipidemia:reviewed last labs, LDL was 69. She denies side effects., she is due for repeat labs today, taking statin therapy and denies side effects   Post-inflammatory pulmonary fibrosis/RAD: under the care of pulmonologist at [facility], she has occasional cough she continues to have SOB - likely multifactorial. She has been using Flovent prn   Lupus involvement lungs: she is seeing Rheumatologist , taking plaquenil but continues to have some SOB , she is going to make sure that her eye exam is up to date. Reviewed labs done in July at [facility]    Obesity: her BMI was down to 29.94 but gradually went up and   BMI above 30 again , she has not been going to the gym, she states she has depressed and has lack of motivation   GERD: she states she has been doing better with new medication but she is not sure of the name, looks like it is Omeprazole, no problems lately   Vertigo: she still has intermittent symptoms takes Meclizine and also zofran from chronic nauseaShe states episodes not as often now. Same   Empty Sella: she had repeat MRI, no longer her pituitary adenoma. Unchanged   Chronic pain from Lupus and FMS: going to pain clinic, was given Lyrica but Rheumatologist told her stop because it was not helping, she will try going back to pain clinic. She states the pain causes her not to have energy   Eczema: she has medication given by Dermatologist and is under control at this time   Neurogenic claudication: she is doing better, but states not exercising as much so not sure if it is the reason why legs are not aching as much, just joints at this time  Patient Active Problem List   Diagnosis Date Noted  . Hyperglycemia 10/23/2017  . Vertigo 09/29/2016  . Bronchiolitis obliterans organizing pneumonia ([place]) 09/29/2016  . Neck pain, musculoskeletal 12/15/2015  . Abnormal CAT scan 04/11/2015  . Auditory impairment 04/11/2015  . Insomnia, persistent 04/11/2015  . Chronic kidney disease (CKD), stage I 04/11/2015  . Chronic nonmalignant  pain 04/11/2015  . Chronic constipation 04/11/2015  . Dyslipidemia 04/11/2015  . Fibromyalgia 04/11/2015  . Gastro-esophageal reflux disease without esophagitis 04/11/2015  . Dysphonia 04/11/2015  . Low back pain 04/11/2015  . Eczema intertrigo 04/11/2015  . Keratosis pilaris 04/11/2015  . Chronic recurrent major depressive disorder ([place]) 04/11/2015  . Dysmetabolic syndrome 58/52/7782  . Neurogenic claudication ([place]) 04/11/2015  . Perennial allergic rhinitis with seasonal variation  04/11/2015  . Abnormal presence of protein in urine 04/11/2015  . Seborrhea capitis 04/11/2015  . Moderate dysplasia of vulva 04/11/2015  . Vitamin D deficiency 04/11/2015  . Spinal stenosis of lumbar region 04/11/2015  . Treadmill stress test negative for angina pectoris 03/05/2015  . Shortness of breath 05/20/2014  . Asthma, chronic 12/31/2013  . H/O total knee replacement 12/31/2013  . Episcleritis 09/26/2013  . Vocal cord dysfunction 05/28/2013  . Anxiety, generalized 03/19/2013  . Back pain 12/18/2012  . Pulmonary nodules 11/26/2012  . Lung nodule, multiple 11/26/2012  . Arthritis, degenerative 11/01/2012  . H/O aspiration pneumonitis 10/22/2012  . Postinflammatory pulmonary fibrosis ([place]) 10/22/2012  . Mixed incontinence 05/04/2012  . Benign neoplasm of pituitary gland and craniopharyngeal duct ([place]) 12/29/2010  . Benign neoplasm of pituitary gland ([place]) 12/29/2010  . Lung involvement in systemic lupus erythematosus ([place]) 11/05/2010  . Chronic nausea 07/01/2008  . Benign hypertension 01/25/2005    Past Surgical History:  Procedure Laterality Date  . BRAIN SURGERY    . CHOLECYSTECTOMY    . VAGINAL HYSTERECTOMY      Family History  Problem Relation Age of Onset  . Breast cancer Mother   . Cancer Mother 71       Breast  . Cancer Father 62       Stomach  . Breast cancer Maternal Aunt     Social History   Tobacco Use  . Smoking status: Never Smoker  . Smokeless tobacco: Never Used  Substance Use Topics  . Alcohol use: No    Alcohol/week: 0.0 standard drinks     Current Outpatient Medications:  .  amLODipine (NORVASC) 5 MG tablet, Take 1 tablet (5 mg total) by mouth daily., Disp: 90 tablet, Rfl: 1 .  aspirin EC 81 MG tablet, Take 1 tablet (81 mg total) by mouth daily., Disp: 30 tablet, Rfl: 0 .  atorvastatin (LIPITOR) 40 MG tablet, TAKE 1 TABLET BY MOUTH  DAILY, Disp: 90 tablet, Rfl: 3 .  Biotin 1 MG CAPS, Take 1 capsule by mouth daily., Disp: 30 capsule,  Rfl: 0 .  buPROPion (WELLBUTRIN XL) 150 MG 24 hr tablet, Take 150 mg by mouth every morning., Disp: , Rfl:  .  Cholecalciferol (VITAMIN D) 2000 UNITS CAPS, Take 1 capsule by mouth daily., Disp: , Rfl:  .  clobetasol ointment (TEMOVATE) 0.05 %, Apply topically., Disp: , Rfl:  .  clonazePAM (KLONOPIN) 0.5 MG tablet, Take 0.5 mg by mouth daily., Disp: , Rfl:  .  clotrimazole-betamethasone (LOTRISONE) cream, Apply 1 application topically 2 (two) times daily., Disp: 45 g, Rfl: 0 .  cyclobenzaprine (FLEXERIL) 5 MG tablet, TAKE 1 TABLET BY MOUTH  EVERY 8 HOURS AS NEEDED FOR MUSCLE SPASMS. DO NOT DRIVE FOR 8 HOURS, Disp: 90 tablet, Rfl: 1 .  diclofenac sodium (VOLTAREN) 1 % GEL, APP 2 GRAMS EXT AA QID, Disp: , Rfl: 6 .  DULoxetine (CYMBALTA) 60 MG capsule, Take 60 mg by mouth at bedtime., Disp: , Rfl:  .  fluticasone (FLONASE) 50 MCG/ACT nasal spray, Place 2 sprays into both nostrils daily.,  Disp: 48 g, Rfl: 3 .  hydroxychloroquine (PLAQUENIL) 200 MG tablet, Take by mouth 2 (two) times daily., Disp: , Rfl:  .  lamoTRIgine (LAMICTAL) 200 MG tablet, Take 200 mg by mouth 2 (two) times daily., Disp: , Rfl:  .  meclizine (ANTIVERT) 25 MG tablet, Take 1 tablet (25 mg total) by mouth 3 (three) times daily as needed., Disp: 30 tablet, Rfl: 0 .  Melatonin 10 MG TABS, Take 1 tablet by mouth at bedtime., Disp: , Rfl:  .  mycophenolate (MYFORTIC) 360 MG TBEC EC tablet, Take 360 mg by mouth 2 (two) times daily., Disp: , Rfl:  .  olopatadine (PATANOL) 0.1 % ophthalmic solution, Place 1 drop into both eyes 2 (two) times daily., Disp: 5 mL, Rfl: 3 .  omeprazole (PRILOSEC) 40 MG capsule, TAKE 1 CAPSULE BY MOUTH  DAILY, Disp: 90 capsule, Rfl: 3 .  ondansetron (ZOFRAN) 4 MG tablet, TAKE 1 TABLET BY MOUTH  EVERY 8 HOURS AS NEEDED FOR NAUSEA AND VOMITING, Disp: 90 tablet, Rfl: 0 .  Polyethylene Glycol 3350 (MIRALAX PO), Take by mouth., Disp: , Rfl:  .  telmisartan (MICARDIS) 80 MG tablet, Take 1 tablet (80 mg total) by mouth  daily., Disp: 90 tablet, Rfl: 1 .  traZODone (DESYREL) 100 MG tablet, Take 150 mg by mouth at bedtime., Disp: , Rfl:  .  triamcinolone ointment (KENALOG) 0.1 %, , Disp: , Rfl:   Allergies  Allergen Reactions  . Ace Inhibitors     Other reaction(s): OTHER Pt states she can not take ace inhibitors. Pt states she can not remember her reaction.   . Bee Pollen   . Penicillins   . Pollen Extract     I personally reviewed active problem list, medication list, allergies, family history, social history, health maintenance with the patient/caregiver today.   ROS  Constitutional: Negative for fever or weight change.  Respiratory: Negative for cough and shortness of breath.   Cardiovascular: Negative for chest pain or palpitations.  Gastrointestinal: Negative for abdominal pain, no bowel changes.  Musculoskeletal: Negative for gait problem or joint swelling.  Skin: Negative for rash.  Neurological: Negative for dizziness or headache.  No other specific complaints in a complete review of systems (except as listed in HPI above).  Objective  Vitals:   06/15/20 0839  BP: 132/86  Pulse: 86  Resp: 16  Temp: 98.1 F (36.7 C)  TempSrc: Oral  SpO2: 99%  Weight: 226 lb (102.5 kg)  Height: 5\' 8"  (1.727 m)    Body mass index is 34.36 kg/m.  Physical Exam  Constitutional: Patient appears well-developed and well-nourished. Obese  No distress.  HEENT: head atraumatic, normocephalic, pupils equal and reactive to light,  neck supple Cardiovascular: Normal rate, regular rhythm and normal heart sounds.  No murmur heard. No BLE edema. Pulmonary/Chest: Effort normal and breath sounds normal. No respiratory distress. Abdominal: Soft.  There is no tenderness. Psychiatric: Patient has a normal mood and affect. behavior is normal. Judgment and thought content normal.  PHQ2/9: Depression screen [facility] 2/9 06/15/2020 02/14/2020 01/24/2020 11/11/2019 11/07/2019  Decreased Interest 2 1 0 3 1  Down,  Depressed, Hopeless 2 0 1 1 1   PHQ - 2 Score 4 1 1 4 2   Altered sleeping 0 0 - 0 0  Tired, decreased energy 2 1 - 1 2  Change in appetite 3 0 - 3 3  Feeling bad or failure about yourself  1 1 - 0 3  Trouble concentrating 0 0 - 0  3  Moving slowly or fidgety/restless 0 0 - 0 2  Suicidal thoughts 0 0 - 0 0  PHQ-9 Score 10 3 - 8 15  Difficult doing work/chores Somewhat difficult Not difficult at all - Somewhat difficult Somewhat difficult  Some recent data might be hidden    phq 9 is positive   Fall Risk: Fall Risk  06/15/2020 02/14/2020 01/24/2020 11/11/2019 11/07/2019  Falls in the past year? 0 0 0 0 0  Number falls in past yr: 0 0 - 0 0  Comment - - - - -  Injury with Fall? 0 0 - 0 0  Risk for fall due to : - - Medication side effect - -  Risk for fall due to: Comment - - - - -  Follow up - Falls evaluation completed Falls prevention discussed - -     Functional Status Survey: Is the patient deaf or have difficulty hearing?: Yes Does the patient have difficulty seeing, even when wearing glasses/contacts?: No Does the patient have difficulty concentrating, remembering, or making decisions?: Yes Does the patient have difficulty walking or climbing stairs?: Yes Does the patient have difficulty dressing or bathing?: No Does the patient have difficulty doing errands alone such as visiting a doctor's office or shopping?: No   Assessment & Plan  1. Diabetes mellitus type 2 in obese (HCC)  - POCT HgB A1C - HM Diabetes Foot Exam  2. Encounter for screening mammogram for malignant neoplasm of breast  - MM 3D SCREEN BREAST BILATERAL  3. Need for immunization against influenza  - Flu Vaccine QUAD 36+ mos IM  4. Neurogenic claudication (HCC)  Stable   4. Benign hypertension  - amLODipine (NORVASC) 5 MG tablet; Take 1 tablet (5 mg total) by mouth daily.  Dispense: 90 tablet; Refill: 1  5. Empty sella ([place])  She denies double vision, has some tension headaches   6.  Fibromyalgia  Seeing pain clinic, tried neurontin and lyrica   7. Chronic recurrent major depressive disorder ([place])   8. Postinflammatory pulmonary fibrosis ([place])  She will follow up with pulmonologist   9. Dyslipidemia  - Lipid panel - COMPLETE METABOLIC PANEL WITH GFR

## 2020-06-12 NOTE — Unmapped (Signed)
Phone call 06/10/20:     Spoke with Ms. Miron for about 30 min on 10/6 to discuss concerns for ongoing depression. She reports that her feelings of sadness have worsened. She notes that she is feeling isolated and is especially down about not being able to interact with her grandchildren as much.     We discuss her pharmacotherapy regimen and that it includes:     Lamictal 400mg    Cymbalta 120mg   Wellbutrin 150mg    Trazodone 150mg    Clonazepam 0.25mg      Among these, Lamictal is not one I would likely increase given it is already at quite a high dose for depressive disorders; >200mg  does not show a benefit in prevention of depressive relapse.     We attempted to increase wellbutrin to 300mg  in the past, which mostly seemed to cause psychomotor activation without resolution of any depressive symptoms. Ultimately might try to come off this in future.     We therefore discussed transitioning from cymbalta to another antidepressant. Ms. Simms has tried many different antidepressants in past including sertraline, vilazadone (to which she has a listed allergy, swelling), desvenlafaxine, escitalopram, and paroxetine. It does not appear that she has tried effexor before.     Another option we discussed is mirtazapine, but the s/e of weight gain is undesirable and outweighs perceived benefits for Ms. Tucker.     As of 10/6 we decided to take a wait-and-see approach as Ms. Schake feels there are many social stressors contributing to her mood, but as of our most recent discussion, 10/8, we plan to make the following changes and cross-titrate to effexor:     DECREASE cymbalta to 1 tab at bedtime (60mg )  STOP taking cymbalta in am  START taking 1 tab effexor xr (37.5mg ) qam.     We plan to f/u Wednesday morning for discussion or sooner if needed. Informed Ms. Plocher of common s/e and potential adverse reactions.     Connye Burkitt MD

## 2020-06-15 ENCOUNTER — Other Ambulatory Visit: Payer: Self-pay

## 2020-06-15 ENCOUNTER — Ambulatory Visit (INDEPENDENT_AMBULATORY_CARE_PROVIDER_SITE_OTHER): Payer: Medicare Other | Admitting: Family Medicine

## 2020-06-15 ENCOUNTER — Encounter: Payer: Self-pay | Admitting: Family Medicine

## 2020-06-15 VITALS — BP 132/86 | HR 86 | Temp 98.1°F | Resp 16 | Ht 68.0 in | Wt 226.0 lb

## 2020-06-15 DIAGNOSIS — F339 Major depressive disorder, recurrent, unspecified: Secondary | ICD-10-CM

## 2020-06-15 DIAGNOSIS — Z1231 Encounter for screening mammogram for malignant neoplasm of breast: Secondary | ICD-10-CM | POA: Diagnosis not present

## 2020-06-15 DIAGNOSIS — E236 Other disorders of pituitary gland: Secondary | ICD-10-CM

## 2020-06-15 DIAGNOSIS — Z23 Encounter for immunization: Secondary | ICD-10-CM

## 2020-06-15 DIAGNOSIS — J8410 Pulmonary fibrosis, unspecified: Secondary | ICD-10-CM

## 2020-06-15 DIAGNOSIS — I10 Essential (primary) hypertension: Secondary | ICD-10-CM

## 2020-06-15 DIAGNOSIS — E1169 Type 2 diabetes mellitus with other specified complication: Secondary | ICD-10-CM

## 2020-06-15 DIAGNOSIS — E669 Obesity, unspecified: Secondary | ICD-10-CM | POA: Diagnosis not present

## 2020-06-15 DIAGNOSIS — G9519 Other vascular myelopathies: Secondary | ICD-10-CM

## 2020-06-15 DIAGNOSIS — E785 Hyperlipidemia, unspecified: Secondary | ICD-10-CM

## 2020-06-15 DIAGNOSIS — M797 Fibromyalgia: Secondary | ICD-10-CM

## 2020-06-15 LAB — LIPID PANEL
Cholesterol: 151 mg/dL (ref <200)
HDL: 61 mg/dL (ref >=50)
LDL Cholesterol (Calc): 74 mg/dL (calc)
Non-HDL Cholesterol (Calc): 90 mg/dL (calc) (ref <130)
Total CHOL/HDL Ratio: 2.5 (calc) (ref <5.0)
Triglycerides: 78 mg/dL (ref <150)

## 2020-06-15 LAB — COMPLETE METABOLIC PANEL WITH GFR
AG Ratio: 1.1 (calc) (ref 1.0–2.5)
ALT: 15 U/L (ref 6–29)
AST: 21 U/L (ref 10–35)
Albumin: 3.8 g/dL (ref 3.6–5.1)
Alkaline phosphatase (APISO): 67 U/L (ref 37–153)
BUN: 8 mg/dL (ref 7–25)
CO2: 34 mmol/L — ABNORMAL HIGH (ref 20–32)
Calcium: 9.8 mg/dL (ref 8.6–10.4)
Chloride: 103 mmol/L (ref 98–110)
Creat: 0.98 mg/dL (ref 0.50–0.99)
GFR, Est African American: 72 mL/min/{1.73_m2} (ref >=60)
GFR, Est Non African American: 62 mL/min/{1.73_m2} (ref >=60)
Globulin: 3.4 g/dL (calc) (ref 1.9–3.7)
Glucose, Bld: 88 mg/dL (ref 65–99)
Potassium: 5.1 mmol/L (ref 3.5–5.3)
Sodium: 140 mmol/L (ref 135–146)
Total Bilirubin: 0.4 mg/dL (ref 0.2–1.2)
Total Protein: 7.2 g/dL (ref 6.1–8.1)

## 2020-06-15 LAB — POCT GLYCOSYLATED HEMOGLOBIN (HGB A1C): Hemoglobin A1C: 5.5 % (ref 4.0–5.6)

## 2020-06-15 MED ORDER — AMLODIPINE BESYLATE 5 MG PO TABS: 5.0000 mg | ORAL_TABLET | Freq: Every day | ORAL | 1 refills | Status: DC

## 2020-06-17 ENCOUNTER — Telehealth: Payer: Self-pay

## 2020-06-17 NOTE — Telephone Encounter (Signed)
  Chronic Care Management   Outreach Note  06/17/2020 Name: Nicole Ballard MRN: [id number] DOB: [birth year]    Nicole Ballard is currently engaged with the chronic care management team. She was unable to complete a routine outreach today but agreeable to outreach next week.      Follow Up Plan:  A member of the care management team will follow up with Nicole Ballard next week.    [facility] Care Management [facility] [phone]

## 2020-06-17 NOTE — Unmapped (Signed)
Spoke to Ms. Slinker this am. She reports no s/e from starting effexor. We talked about potential for s/e like insomnia should she take it at night, but that she takes her cymbalta this way so we decided to try 37.5qam and 37.5 at bedtime, and to STOP cymbalta at this time. She feels her mood overall is good right now and enjoyed going to her granddaughter's softball game this weekend!    Will f/u via phone for any s/e or concerns and have a video apt next wed      Janetta Hora MD

## 2020-06-22 ENCOUNTER — Ambulatory Visit: Payer: Self-pay

## 2020-06-22 ENCOUNTER — Encounter: Admit: 2020-06-22 | Discharge: 2020-06-22 | Payer: MEDICARE | Attending: Psychologist | Primary: Psychologist

## 2020-06-22 ENCOUNTER — Ambulatory Visit: Admit: 2020-06-22 | Discharge: 2020-06-22 | Payer: MEDICARE

## 2020-06-22 DIAGNOSIS — L408 Other psoriasis: Secondary | ICD-10-CM | POA: Diagnosis not present

## 2020-06-22 DIAGNOSIS — I10 Essential (primary) hypertension: Secondary | ICD-10-CM

## 2020-06-22 DIAGNOSIS — L309 Dermatitis, unspecified: Secondary | ICD-10-CM | POA: Diagnosis not present

## 2020-06-22 DIAGNOSIS — F5231 Female orgasmic disorder: Principal | ICD-10-CM

## 2020-06-22 DIAGNOSIS — L292 Pruritus vulvae: Principal | ICD-10-CM

## 2020-06-22 MED ORDER — CLOBETASOL 0.05 % TOPICAL OINTMENT
Freq: Every evening | TOPICAL | 3 refills | 0.00000 days | Status: CP
Start: 2020-06-22 — End: 2021-06-22

## 2020-06-22 NOTE — Chronic Care Management (AMB) (Signed)
Chronic Care Management   Follow Up Note   06/22/2020 Name: Nicole Ballard MRN: [id number] DOB: 1955/12/19  Primary Care Provider: Steele Sizer, MD Reason for referral : Chronic Care Management   Nicole Ballard is a 64 y.o. year old female who is a primary care patient of Steele Sizer, MD. She is currently engaged with the chronic care management team. A routine telephonic outreach was conducted today.  Review of Nicole Ballard's status, including review of consultants reports, relevant labs and test results was conducted today. Collaboration with appropriate care team members was performed as part of the comprehensive evaluation and provision of chronic care management services.     SDOH (Social Determinants of Health) assessments performed: No     Outpatient Encounter Medications as of 06/22/2020  Medication Sig Note   amLODipine (NORVASC) 5 MG tablet Take 1 tablet (5 mg total) by mouth daily.    aspirin EC 81 MG tablet Take 1 tablet (81 mg total) by mouth daily.    atorvastatin (LIPITOR) 40 MG tablet TAKE 1 TABLET BY MOUTH  DAILY    Biotin 1 MG CAPS Take 1 capsule by mouth daily.    buPROPion (WELLBUTRIN XL) 150 MG 24 hr tablet Take 150 mg by mouth every morning.    Cholecalciferol (VITAMIN D) 2000 UNITS CAPS Take 1 capsule by mouth daily. 03/18/2019: Needs to pick up from pharmacy   clobetasol ointment (TEMOVATE) 0.05 % Apply topically.    clonazePAM (KLONOPIN) 0.5 MG tablet Take 0.5 mg by mouth daily.    clotrimazole-betamethasone (LOTRISONE) cream Apply 1 application topically 2 (two) times daily.    cyclobenzaprine (FLEXERIL) 5 MG tablet TAKE 1 TABLET BY MOUTH  EVERY 8 HOURS AS NEEDED FOR MUSCLE SPASMS. DO NOT DRIVE FOR 8 HOURS 12/12/8117: prn   diclofenac sodium (VOLTAREN) 1 % GEL APP 2 GRAMS EXT AA QID 04/28/2015: Received from: External Pharmacy   DULoxetine (CYMBALTA) 60 MG capsule Take 60 mg by mouth at bedtime.    fluticasone (FLONASE) 50 MCG/ACT  nasal spray Place 2 sprays into both nostrils daily.    hydroxychloroquine (PLAQUENIL) 200 MG tablet Take by mouth 2 (two) times daily.    lamoTRIgine (LAMICTAL) 200 MG tablet Take 200 mg by mouth 2 (two) times daily.    meclizine (ANTIVERT) 25 MG tablet Take 1 tablet (25 mg total) by mouth 3 (three) times daily as needed. 02/14/2020: prn   Melatonin 10 MG TABS Take 1 tablet by mouth at bedtime.    mycophenolate (MYFORTIC) 360 MG TBEC EC tablet Take 360 mg by mouth 2 (two) times daily.    olopatadine (PATANOL) 0.1 % ophthalmic solution Place 1 drop into both eyes 2 (two) times daily.    omeprazole (PRILOSEC) 40 MG capsule TAKE 1 CAPSULE BY MOUTH  DAILY    ondansetron (ZOFRAN) 4 MG tablet TAKE 1 TABLET BY MOUTH  EVERY 8 HOURS AS NEEDED FOR NAUSEA AND VOMITING 02/14/2020: prn   Polyethylene Glycol 3350 (MIRALAX PO) Take by mouth. 02/14/2020: prn   telmisartan (MICARDIS) 80 MG tablet Take 1 tablet (80 mg total) by mouth daily.    traZODone (DESYREL) 100 MG tablet Take 150 mg by mouth at bedtime.    triamcinolone ointment (KENALOG) 0.1 %  02/14/2020: prn   No facility-administered encounter medications on file as of 06/22/2020.     Objective:  BP Readings from Last 3 Encounters:  06/15/20 132/86  02/26/20 136/90  02/14/20 136/72   Lab Results  Component Value Date   CHOL  151 06/15/2020   HDL 61 06/15/2020   LDLCALC 74 06/15/2020   TRIG 78 06/15/2020   CHOLHDL 2.5 06/15/2020     Goals Addressed            This Visit's Progress    Chronic Disease Management       CARE PLAN ENTRY (see longitudinal plan of care for additional care plan information)  Current Barriers:   Chronic Disease Management support and education needs related to Hypertension, Asthma, GERD, Chronic Kidney Disease, Dyslipidemia and Systemic lupus erythematosus (lung involvement)  Nurse Case Manager Clinical Goal(s):   Over the next 90 days, patient will not require hospitalization or emergent  evaluation d/t chronic illnesses.  Over the next 90 days, patient will attend all medical appointments.-Complete  Over the next 90 days, patient will take all medications as prescribed.-Complete  Over the next 90 days, patient will weigh at least once a week and record readings.-Complete  Over the next 90 days, patient will monitor blood pressure daily and record readings.  Over the next 90 days, patient will follow recommended safety measures to prevent falls and injuries.-Complete  Over the next 10 days, patient will work with Care Guides to discuss options for transportation assistance.-Complete  Interventions:   Inter-disciplinary care team collaboration (see longitudinal plan of care)  Discussed plan for ongoing care management . Nicole Ballard remains compliant with treatment recommendations but reports several episodes of pain in her right hand over the past few weeks. Reports the previously prescribed Lyrica was discontinued and she will follow up with Pain Management. Her activity level has decreased and she reports going to the gym less d/t pain. Reports following recommended safety precautions. Denies falls. Declined need for additional in-home assistance but will follow-up if activity level continues to decline. She remains motivated to reach her health goals. We will plan to follow up again next month to discuss updates.   Patient Self Care Activities:   Self administers medications   Attends scheduled provider appointments  Calls pharmacy for medication refills  Attends church or other social activities  Performs ADL's independently  Performs IADL's independently  Calls provider office for new concerns or questions   Please see past updates related to this goal by clicking on the "Past Updates" button in the selected goal           PLAN A member of the care management team will follow-up with Nicole Ballard next month.    [facility]  Care Management [facility] [phone]

## 2020-06-22 NOTE — Unmapped (Addendum)
Healthy vulval hygiene practices  Avoid Substitute   Clothing   Pantyhose Stockings with a garter belt  Thigh-high or knee-high stockings    Synthetic underwear Cotton underwear or no underwear   Jeans and other tight pants Loose pants, skirts, dresses   Swimsuits, leotards, thongs, lycra garments Loose-fitting cotton garments   Cleansing products   Scented soaps or shampoos Fragrance-free pH neutral soap   Bubble bath Tub baths in the morning and at night without additives and at a comfortable temperature   Scented detergents Unscented detergents   Baby wipes or flushable wipes Rinse with water using sports water bottle or perineal irrigation bottle   Feminine sprays, douches, powders These are not necessary products and can be omitted from personal practices   Other   Washcloths Use fingertips for washing; pat dry, do not rub dry   Panty liners Tampons or cotton pads   Dyed toilet articles Toilet articles without dyes   Hair dryers to dry vulva skin without contact Dry vulva by gentle patting   Vulvar/Vaginal Moisturizers    Moisturizer Options:  ?? Vitamin E oil: pump or capsule form  ?? Vitamin E cream (Gene???s vitamin E cream)  ?? Coconut oil: bottle or bead form  ?? Shea butter  ?? Blossom Organic Lubricant (organic and all natural; www.blossomorganics.com)  ?? PE suppository(coconut oil/vitamin E/palm oil)  ?? [employer] Harvest Aloe Glide??       Consider the ingredients of the product ??? the fewer the ingredients the better!    Directions for Use:  1. Clean and dry your hands  2. Gently dab the vulvar/vaginal area dry as needed  3. Apply a ???pea-sized??? amount of the moisturizer onto your fingertip  4. Using you other hand, open the labia    5. Apply the moisturizer to the vulvar/vaginal tissues  6. Wear loose fitting underwear/clothing if possible following application    Use moisturize 2-3 times daily as desired.      [facility]  Address:??[address] [place], [place] [address]  Phone:??[phone]

## 2020-06-22 NOTE — Unmapped (Signed)
Assessment  Problem List Items Addressed This Visit     None      Visit Diagnoses     Vulvar itching    -  Primary    Relevant Medications    clobetasoL (TEMOVATE) 0.05 % ointment    Vulvar dermatitis        Relevant Medications    clobetasoL (TEMOVATE) 0.05 % ointment    Anorgasmia of female              Symptoms controlled w/topical steroid            Plan      >Known or suspected allergens and irritants in the environment should be eliminated.  >Gentle skin care was encouraged. Soaking in warm water, without additives, for five minutes in the morning and night.  Moisture is then sealed into the skin with the application of a nonallergenic emollient (eg, petroleum jelly) or a topical corticosteroid ointment   >repeat a course of clobetasol propionate  dipropionate 0.05% ointment at night daily for 4 - 6 weeks   >continue to optimize treatment of depression  >recommend f/u w/sex therapist--may be a candidate for directed masturbation, CBT  >Kegel exercises  Follow up as needed    I personally spent 25 minutes face-to-face and non-face-to-face in the care of this patient, which includes all pre, intra, and post visit time on the date of service.         Patient ID: Caitlin Smith, female   DOB: 07/26/1956, 64 y.o.   MRN: [id number]    Chief Complaint   Patient presents with   ??? Follow-up       HPI  Caitlin Smith is a 64 y.o. female. Vulva itching responds to clobetasol--returns when discontinues ointment.  Also complains that she doesn't feel anything when she has sex.  H/O depression.    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  In addition, she had a vulvar lesion biopsed in 2015 that revealed lichen simplex chronicus.   Vulva biopsy:  A: Vulva, right, biopsy  - Mild dermal chronic inflammation, acanthosis, hyperkeratosis, parakeratosis, and spongiosis (see comment)  - No dysplasia or carcinoma identified  Findings are non-specific, but may be due to chronic irritation, contact dermatitis, or atopic dermatitis    Review of Systems  Review of Systems  Constitutional: negative for fatigue and weight loss  Respiratory: negative for cough and wheezing  Cardiovascular: negative for chest pain, fatigue and palpitations  Gastrointestinal: negative for abdominal pain and change in bowel habits  Genitourinary:negative for abnormal discharge  Integument/breast: negative for nipple discharge  Musculoskeletal:negative for myalgias  Neurological: negative for gait problems and tremors  Behavioral/Psych: negative for abusive relationship, depression  Endocrine: negative for temperature intolerance        Blood pressure (P) 163/83, pulse (P) 71, height (P) 170.2 cm (5' 7), weight (!) (P) 103.6 kg (228 lb 4.8 oz), not currently breastfeeding.    Physical Exam  Physical Exam  General:   alert   Skin:   no rash or abnormalities   Lungs:   clear to auscultation bilaterally   Heart:   regular rate and rhythm, S1, S2 normal, no murmur, click, rub or gallop   Abdomen:  normal findings: no organomegaly, soft, non-tender and no hernia   Pelvis:  External genitalia: normal general appearance

## 2020-06-23 ENCOUNTER — Institutional Professional Consult (permissible substitution): Admit: 2020-06-23 | Discharge: 2020-06-24 | Payer: MEDICARE | Attending: Psychologist | Primary: Psychologist

## 2020-06-23 DIAGNOSIS — F4310 Post-traumatic stress disorder, unspecified: Principal | ICD-10-CM

## 2020-06-23 DIAGNOSIS — F410 Panic disorder [episodic paroxysmal anxiety] without agoraphobia: Principal | ICD-10-CM

## 2020-06-23 DIAGNOSIS — F411 Generalized anxiety disorder: Principal | ICD-10-CM

## 2020-06-23 DIAGNOSIS — F331 Major depressive disorder, recurrent, moderate: Principal | ICD-10-CM

## 2020-06-23 NOTE — Unmapped (Signed)
[facility]   Confidential Psychological Therapy Session      Patient Name: Caitlin Smith  Medical Record Number: [id number]  Date of Service: June 23, 2020  Attending Psychologist: Caroline More, PhD  CPT Procedure Codes: 28413 for 60 mins of face to face counseling    Due to the current declared state emergency during the coronavirus pandemic, all non-urgent medical and mental health visits have been triaged to either be delayed or take place virtually via phone or videoconferencing.   Due to the need for continued patient interaction for mental health care, pain management, and care coordination that necessitates the involvement of this provider, this visit was performed face to face using interactive technology using a HIPPA compliant audio/visual platform. This patient will be scheduled for face to face visits in the future once it has been deemed safe.      We reviewed confidentiality today. The patient was present at home (location and contact information confirmed), attended this visit alone, and consented to this virtual pain psychology visit.     Visit modifiers:   GT for Interactive Technology and CR for catastrophe/disaster related due to coronavirus pandemic    REFERRING PHYSICIAN: Clarene Essex, MD    CHIEF COMPLAINT AND REASON FOR VISIT: pain coping skills, CBT to address depression and anxiety in the setting of chronic pain    SUBJECTIVE / HISTORY OF PRESENT ILLNESS: Caitlin Smith is a very pleasant 64 y.o.  female from [place], [place] with multiple chronic pain complaints related to fibromyalgia and lupus who initially met with me in October 2016, and which time she was diagnosed with severe depression, PTSD, panic disorder, and generalized anxiety.The patient returns for a therapy session today. Last follow up with me was 06/08/20.     Patient participates actively throughout her therapy session today.  Affect is brighter and she reports improvement in mood.  In the interim she met with her psychiatrist Dr. Dayton Scrape and was transitioned to Effexor.  She has had follow-up with Dr. Dayton Scrape and next scheduled follow-up is tomorrow.  Patient expresses appreciation for mental health support.  Focused on behavioral activation today now that mood is improving.  Set behavioral activation goals.  Also spent extra time reviewing valued activities and encouraged participation in valued activities.  Patient is less focused on negative aspects of her life and more focused on positive lines.  Connected this back to mindfulness and encouraged daily mindfulness practice.    OBJECTIVE / MENTAL STATUS:    Appearance:   Appears stated age and Clean/Neat   Motor:  No abnormal movements   Speech/Language:   Normal rate, volume, tone, fluency   Mood:  Depressed and Anxious   Affect:  blunted   Thought process:  Logical, linear, clear, coherent, goal directed   Thought content:    Denies SI, HI, self harm, delusions, obsessions, paranoid ideation, or ideas of reference   Perceptual disturbances:    Denies auditory and visual hallucinations, behavior not concerning for response to internal stimuli   Orientation:  Oriented to person, place, time, and general circumstances   Attention:  Able to fully attend without fluctuations in consciousness   Concentration:  Able to fully concentrate and attend   Memory:  Immediate, short-term, long-term, and recall grossly intact    Fund of knowledge:   Consistent with level of education and development   Insight:    Fair   Judgment:   Intact   Impulse Control:  Intact  DIAGNOSTIC IMPRESSION:   Post Traumatic Stress Disorder (PTSD)  Generalized Anxiety Disorder (GAD)  Panic Disorder  Major Depressive Disorder, moderate, recurrent  Chronic pain syndrome  Fibromyalgia  Lupus    ASSESSMENT:   Caitlin Smith is a very pleasant 64 y.o.  female from [place], [place] with multiple chronic pain complaints related to lupus, arthritis, and fibromyalgia. She was previously seen in our clinic from 2012-2014, and reestablished care with Dr. Fayrene Fearing in August 2016. The patient is struggling with depression and anxiety, but is very motivated to participate in intensive, multidisciplinary treatment to address the connection between depression, anxiety and pain. She has a long-standing history of depression and anxiety, and has worked with outpatient psychiatrist and therapist over the years. She is currently established with [facility] psychiat[facility], and is tapering off of Klonopin.  Depression, anxiety, and panic have all improved.  Has been diagnosed with diabetes, and is managing it well with p.o. medication and dietary changes.  Had lost 50+ pounds but regained 20 lbs recently.  Patient presents with low-grade depression related to COVID and some social isolation.       PLAN:   (1) Psychotherapy - Continue CBT. CBT will be used to address PTSD, MDD, Panic D/o, GAD, and chronic pain.     --Behavioral Activation - Encouraged behavioral activation.  Is doing great, walking 5 days/week  --Downloaded and uses Insight Timer, guided relaxation app, for nightly practice.  --Continues to utilize diaphragmatic breathing daily    (2) Psychiatry - Pt currently established with [facility].  - to follow up with her psychiatrist and discuss low mood despite reduced stress and increased behavioral activation. To ask about possibility of adjusting mood stabilizer (Lamictal) versus other possible changes to help improve mood. (in psychotherapy will cont beh activation and identified need for increased socialization - this has changed post covid related school changes, is alone much more these days with her granddaughter back in school)    (3) Safety - Pt denies any current or recent SI or safety concerns. Knows to call 911 or go to her local ED. Also previously given [facility].      (4) follow-up - with me in 1 month

## 2020-06-28 ENCOUNTER — Other Ambulatory Visit: Payer: Self-pay | Admitting: Family Medicine

## 2020-07-01 ENCOUNTER — Encounter
Admit: 2020-07-01 | Discharge: 2020-07-02 | Payer: MEDICARE | Attending: Student in an Organized Health Care Education/Training Program | Primary: Student in an Organized Health Care Education/Training Program

## 2020-07-01 MED ORDER — VENLAFAXINE ER 37.5 MG CAPSULE,EXTENDED RELEASE 24 HR
ORAL_CAPSULE | Freq: Every day | ORAL | 3 refills | 30 days | Status: CP
Start: 2020-07-01 — End: ?

## 2020-07-01 NOTE — Unmapped (Signed)
[facility]  Psychiatry Telehealth Encounter  Established Patient - Video Visit    I spent 30 minutes on the real-time audio and video with the patient. I spent an additional 10 minutes on pre- and post-visit activities.     The patient was physically located in [place] or a state in which I am permitted to provide care. The patient and/or parent/guardian understood that s/he may incur co-pays and cost sharing, and agreed to the telemedicine visit. The visit was reasonable and appropriate under the circumstances given the patient's presentation at the time.    The patient and/or parent/guardian has been advised of the potential risks and limitations of this mode of treatment (including, but not limited to, the absence of in-person examination) and has agreed to be treated using telemedicine. The patient's/patient's family's questions regarding telemedicine have been answered.     If the visit was completed in an ambulatory setting, the patient and/or parent/guardian has also been advised to contact their provider???s office for worsening conditions, and seek emergency medical treatment and/or call 911 if the patient deems either necessary.    Assessment:  Caitlin Smith is a 64 y.o. female with a history of MDD, GAD, and Panic Disorder in the context of many chronic medical conditions including chronic pain/fibromyalgia, osteoarthritis with degenerative disc disease, Lupus, pituitary tumor (benign), s/p right total knee arthroplasty, and VIN II (s/p resection), who presents for follow-up evaluation. She has been maintained on Lamictal for many years as well as cymbalta, trazodone, klonopin, and wellbutrin. Patient has previously tolerated a slight taper in her klonopin from 0.75 mg to 0.25 mg qHS overtime. Her mood is often exacerbated by steroid use for current treatment of pulmonary disease. Patient is engaged with CBT therapy at the pain clinic.    Met Caitlin Smith today 07/01/20 for epic video-visit. She reports betterment of mood on effexor, but feels some family stress with her children. She has only been taking effexor 37.5mg  once qam. She feels that this has been helpful by itself; she reports that she'd like to stay on this dose Taper of trazodone has been unsuccessful at this point; she continues on 150mg . Will follow-up in 1-2 months.    For Next Visit:  [ ]  Discuss mood, depression  [ ]  Discuss family relationships  [ ]  Continue to invite discussion of tapering medications if patient feels stable    Risk Assessment:  A full suicide and violence risk assessment was performed as part of this patient's initial evaluation with [facility] outpatient psychiatry.  There is no new acute risk for suicide or violence at this time. The patient was educated about relevant modifiable risk factors including following recommendations for treatment of psychiatric illness and abstaining from substance abuse.  While future psychiatric events cannot be accurately predicted, the patient does not currently require acute inpatient psychiatric care and does not currently meet [facility] involuntary commitment criteria.    Current Medication Regimen:  -- Lamictal 400mg  qhs  -- Effexor 37.5 mg qam  -- Wellbutrin XL 150mg  qAM   -- Clonazepam 0.25mg  qHS  -- Trazodone 150mg  qHS    Plan:    # Depression - Anxiety - Panic Disorder  Status: Active, improved from prior visit  - Continue Lamictal 400mg  qhs for mood  - STOPPED/tapered Cymbalta 120mg  qAM   --Continue effexor 37.5mg  qam    --Stopped cymbalta and cross-titrated to effexor. Initially instructed Caitlin Smith to go to 37.5mg  BID but she read the bottle and kept  37.5mg  qam, which she feels is sufficient at this time. Will revisit this and see how she is doing at next visit; can consider increase then  - Continue Wellbutrin XL 150mg  qAM    --Might consider this again as target for taper which would be relatively simple in future  - Continue Clonazepam 0.25mg  qHS, appropriate filling per [facility]  - Continue CBT at pain clinic; advised her to see if therapist willing to talk some about childhood, which is a stressor for patient   - Family excursions/engagement are really helpful to Caitlin Smith who values family    # Insomnia: Last sleep study performed in 2017. Per chart review, showed mild OSA likely related to medication use. Neurology recommends patient avoid sleeping on back as she likely has OSA when sleeping in this position  Status: Active, Unable to continue taper of trazodone as of 10/27; patient self-titrated back to 150mg    --Continue melatonin 3-5mg  at bedtime   -- Continue trazodone 150mg  qHS for insomnia (d5/4/21, d 02/05/20, i9/8)  - Clonazepam per above    # Mild Neurocognitive Impairment: Patient seen by Neurology ([facility]) 05/22/18 who dx pt with mild neurocognitive impairment w/ deficits in inattention and visual-spatial function with suspicion that sleep apnea and polypharmacy may be contributing to her difficulties. Recommended that psychopharm regimen be simplified, including eliminating trazodone and considering changing Wellbutrin to Zoloft or Lexapro for anxiety and eliminating Klonopin when stable.     Status: Ongoing, no new testing as of 11/2019, we have not been able to decrease clonazepam, wellbutrin or trazodone. Pandemic and social atmosphere make changes in pharm more challenging at this time  - Tapering trazodone at this visit  - However, it is very likely that patient's lupus is significantly contributing to cognitive decline, which will limit improvement she will see with reduction in polypharmacy  --Last MOCA 22 on 12/12/2017    # Medical Monitoring - High Risk Medication Use  Status: CBC/Liver function WNL as of 03/2020  - The complexity of this patient's care involves drug therapy which requires intensive monitoring for toxicity. This is in part due to a narrow therapeutic window, as well as potential for toxicity. This patient is being treated with Lamotrigine (Lamictal) to target their psychiatric disorder, Major depressive disorder, refractory. In regards to this medication being considered high risk, Lamotrigine (lamictal) has black box warning for serious rash including fatal reaction of Stevens-Johnson syndrome and rare cases of toxic epidermal necrolysis. In addition to prolonged titration to mitigate risk of serious rash, lamotrigine requires monitoring of hepatic and renal function at baseline, and at times will require monitoring of lamotrigine blood levels.    Lamotrigine Monitoring  - Hepatic function testing, platelets (via CBC) q6 months  -AST/ALT WNL     Lab Results   Component Value Date    WBC 4.5 04/02/2020    HGB 13.2 04/02/2020    HCT 40.9 04/02/2020    PLT 334 04/02/2020       Lab Results   Component Value Date    NA 141 11/09/2019    K 4.3 11/09/2019    CL 100 11/09/2019    CO2 29.0 11/09/2019    BUN 13 11/09/2019    CREATININE 0.90 (H) 04/02/2020    GLU 74 11/09/2019    CALCIUM 9.8 11/09/2019    MG 2.1 12/31/2013    PHOS 4.9 (H) 12/31/2013       Lab Results   Component Value Date    BILITOT 0.7  11/09/2019    BILIDIR <0.10 05/01/2018    PROT 7.3 04/02/2020    ALBUMIN 3.6 04/02/2020    ALT 23 04/02/2020    AST 24 04/02/2020    ALKPHOS 98 11/09/2019    GGT 19 08/26/2012       Lab Results   Component Value Date    PT 12.0 09/17/2012    INR 1.11 01/03/2014    APTT 30.8 01/03/2014     # Return to Clinic: 1 month    Psychotherapy:  No billable psychotherapy service provided but brief supportive therapy was utilized.    Patient has been given this writer's contact information as well as the [facility] urgent line number. The patient has been instructed to call 911 for emergencies.    Patient and plan of care were discussed with the Attending Smith,Caitlin Smith, who agrees with the above statement and plan.     Caitlin Smith    Subjective:     Psychiatric Chief Concern:  Follow-up psychiatric evaluation for depression, anxiety and insomnia.     Interval History: Caitlin Smith feels like the effexor is working well. She is taking 37.5mg  because she noted this on the bottle's instruction; she increased to 70mg  x2 days but decreased after reading the bottle. DIsucssed room to increase but ultimately agreed to stay on the 37.5 since she is feeling a benefit, has no w/d side effects and is not having any perceived s/e from thsi medication. Discussed monitoring mood over the coming weeks and keeping in mind that we can increase in future. Talked about family with Caitlin Smith and her relationship with her son and daughter. She denies SI today and denies worsening depression. She feels positive in terms of her outlook.     Social History: reviewed; patient recently moved apartments within the same complex    ROS:  As per Interval History and:  Constitutional:  no significant appetite change. Anxiety, dysthymia bettered   Neuro:  none / negative    Objective:    Mental Status Exam:   Appearance  Well-appeaing, INAD       Speech/Language:    Normal rate, volume, tone, fluency and Language intact, well formed   Affect  Mildly anxious, mood congruent   Mood:   good   Thought process and Associations:   Logical, linear, clear, coherent, goal directed   Abnormal/psychotic thought content:     Denies SI, HI, self harm, delusions, obsessions, paranoid ideation, or ideas of reference   Perceptual disturbances:     Does not endorse auditory or visual hallucinations     Orientation:   Oriented to person, place, time, and general circumstances   Insight:     Intact   Judgment:    Intact   Impulse Control:   Intact     Physical Exam: UTA phone visit    Medications: reviewed at today's visit    Psychometrics:  Psych Scale Scores - Adult      Office Visit from 09/04/2018 in [facility] [place]   **PHQ-9: Severity Measure for DEPRESSION Total Score**  20 Collected on 09/04/2018 0000   WHODAS 2.0 (Self-administered) - Total Score  42 Collected on 09/04/2018 0000          Caitlin Rea, Smith.

## 2020-07-02 NOTE — Unmapped (Signed)
Chronic Pain Follow Up Note        Assessment and Plan    Caitlin Smith is a 64 y.o.  being followed at [facility] clinic for complaint of chronic pain localized to right knee and bilateral neck, shoulders and upper back myofascial pain and fibromyalgia, and lower back 2/2 lumbar facet arthropathy and DDD and moderate degree of lumbar central spinal canal stenosis. Patient has a history of chronic pain related to lupus and arthritis and is on rheumatological therapy.     Chronic pain syndrome; Polyneuropathy; Fibromyalgia  Patient reports stable pain; stopped Lyrica due to minimal pain relief. We discussed upward titration of Lyrica, but patient would like to restart gabapentin at this time with an increased nighttime dose.  ?? Restart gabapentin 300/300/600 - titration instructions provided  ?? Stop Lyrica  ?? Continue Flexeril  ?? Continue voltaren Gel  ?? Continue pain psychology.  ?? Continue activity as able  Return in about 3 months (around 10/06/2020).    Future considerations:  Genicular nerve block/RFA  Lumbar MBBs  Retry Lyrica with upward titration    Requested Prescriptions     Signed Prescriptions Disp Refills   ??? gabapentin (NEURONTIN) 300 MG capsule 360 capsule 0     Sig: Take 300 mg (1 capsule) in the morning and at 300 mg (1 capsule) at noon and 600 mg (2 capsules) at night.   ??? cyclobenzaprine (FLEXERIL) 5 MG tablet 60 tablet 2     Sig: Take 1 tablet (5 mg total) by mouth two (2) times a day as needed.     No orders of the defined types were placed in this encounter.    Risks and benefits of above medications including but not limited to possibility of respiratory depression, sedation, and even death were discussed with the patient who expressed an understanding.     Urine toxicology screen is not due today.  Treatment agreement renewal is not due today.     HPI  Caitlin Smith is a 65 y.o. being followed at [facility] clinic for complaint of chronic pain localized to right knee and bilateral neck, shoulder, upper back, and lower back 2/2 lumbar facet arthropathy and DDD. Patient has a history of chronic pain related to lupus, arthritis, and fibromyalgia.     At last visit in August, patient reported worsening chronic pain in her shoulders, back, and buttocks. She was started on Lyrica 50 mg BID for her pain complaints and instructed to continue her flexeril and voltaren gel for myofascial pain. A referral was placed for aquatic therapy. An updated lumbar x-ray was ordered, which showed degenerative osteoarthrosis from L3-S1, greatest at right L5-S1 facet as well as mild loss of disc space height at L5-S1.    Today, the patient returns reporting overall unchanged pain on her current medication regimen. She denotes having tried Lyrica for about a month with minimal pain relief. She inquired with her Rheumatologist about worsening myalgias around the time of titrating this medication and was instructed to discontinue its use. She is unsure if the symptoms were related, but she denied significant benefit with Lyrica and was amenable to stopping it. She inquires today about restarting gabapentin and potentially titrating this medication upward since she tolerated it well in the past. She continues on Flexeril with benefit. She denies side effects with her current regimen.    The patient's medication regimen:  Lyrica 50 mg BID  Cymbalta 120mg  am (PCP)  Trazodone 150 mg  qhs (PCP)  Flexeril 5 mg BID prn  Voltaren gel prn    The patient states her pain is located shoulders, low back, hips and the severity of her pain ranges from 6/10 to 8/10.  Her pain currently is 6/10 and on average is 7/10.  She describes the sensation of her pain as aching, shooting, stabbing, throbbing. Her pain is present all of the time and worst during the day. The patient???s pain impacts mood, recreational activities, walking, sitting, standing. Her interval history includes PCP visit, Mental Health visit. Her pain has stayed the same, and she does not have new pain to discuss today. She is not on blood thinners or anti-coagulants. In regards to medications currently taken for pain management, the patient is tolerating these medications well and complains of associated side effects: agitation, dizziness.    Patient denies homicidal/suicidal ideation.     Medication Monitoring  [place] database was reviewed and it was appropriate.  Last urine toxicology screen: N/A, not on opioids  The patient did not bring pill bottles today, not on opioids    Allergies  Allergies   Allergen Reactions   ??? Vilazodone Swelling   ??? Ace Inhibitors Other (See Comments)     Pt states she can not take ace inhibitors. Pt states she can not remember her reaction.    ??? Bee Pollen Itching     COUGH, WATERY EYES   ??? Penicillin G Rash   ??? Penicillins Rash   ??? Pollen Extracts Itching     COUGHING, WATERY EYES       Home Medications    Current Outpatient Medications   Medication Sig Dispense Refill   ??? amLODIPine (NORVASC) 5 MG tablet Take 1 tablet (5 mg total) by mouth daily. 90 tablet 3   ??? aspirin (ECOTRIN) 81 MG tablet Take 81 mg by mouth daily.     ??? atorvastatin (LIPITOR) 40 MG tablet Take 40 mg by mouth daily.     ??? biotin 1 mg cap Take by mouth.     ??? buPROPion (WELLBUTRIN XL) 150 MG 24 hr tablet Take 1 tablet (150 mg total) by mouth every morning. 90 tablet 2   ??? calcipotriene (DOVONOX) 0.005 % ointment Apply every other day to areas of rash in folds as maintenance 60 g 2   ??? clobetasoL (TEMOVATE) 0.05 % ointment Apply BID to all affected area 60 g 5   ??? clobetasoL (TEMOVATE) 0.05 % ointment Apply topically nightly. 45 g 3   ??? clonazePAM (KLONOPIN) 0.5 MG tablet Take 0.5 tablets (0.25 mg total) by mouth nightly. 15 tablet 2   ??? clotrimazole-betamethasone (LOTRISONE) 1-0.05 % cream Apply 1 application topically Two (2) times a day.     ??? cyclobenzaprine (FLEXERIL) 5 MG tablet Take 1 tablet (5 mg total) by mouth two (2) times a day as needed. 60 tablet 2 ??? diclofenac sodium (VOLTAREN) 1 % gel Apply 2 g topically two (2) times a day as needed for arthritis. 300 g 5   ??? dicyclomine (BENTYL) 20 mg tablet Take 20 mg by mouth every six (6) hours.     ??? FLOVENT HFA 110 mcg/actuation inhaler USE 2 PUFFS BY MOUTH TWO  TIMES DAILY 36 g 3   ??? fluticasone propionate (FLONASE) 50 mcg/actuation nasal spray      ??? halobetasol (ULTRAVATE) 0.05 % ointment Apply topically Two (2) times a day. 50 g 1   ??? hydrOXYchloroQUINE (PLAQUENIL) 200 mg tablet Take 1 tablet (200 mg total) by  mouth Two (2) times a day. 180 tablet 3   ??? inhalational spacing device (AEROCHAMBER MV) Spcr 1 each by Miscellaneous route two (2) times a day. With Fovent 1 each 0   ??? ketoconazole (NIZORAL) 2 % cream APP EXT AA D  0   ??? lamoTRIgine (LAMICTAL) 200 MG tablet Take 1 tablet (200 mg total) by mouth Two (2) times a day. 180 tablet 2   ??? meclizine (ANTIVERT) 25 mg tablet Take 25 mg by mouth Three (3) times a day as needed.      ??? melatonin 10 mg Tab Take 1 tablet by mouth.     ??? metroNIDAZOLE (METROGEL) 0.75 % gel Apply one applicatorful (~37.5 mg metronidazole) intravaginally once daily for 5 days. 45 g 0   ??? mupirocin (BACTROBAN) 2 % ointment APP EXT IEN BID  0   ??? mycophenolate (MYFORTIC) 360 MG TbEC Take 1 tablet by mouth twice daily 180 tablet 3   ??? olopatadine (PATANOL) 0.1 % ophthalmic solution Apply to eye.     ??? omeprazole (PRILOSEC) 40 MG capsule Take 40 mg by mouth daily.      ??? ondansetron (ZOFRAN) 4 MG tablet      ??? polyethylene glycol (GLYCOLAX) 17 gram/dose powder 2 cap fulls in a full glass of water, three times a day, for 5 days.     ??? sucralfate (CARAFATE) 1 gram tablet      ??? telmisartan (MICARDIS) 80 MG tablet Take 80 mg by mouth.     ??? traZODone (DESYREL) 100 MG tablet Take 1 tablet (100 mg total) by mouth nightly. 90 tablet 2   ??? triamcinolone (KENALOG) 0.1 % ointment Apply to rash when itchy as needed, few times per week 454 g 0   ??? triamcinolone (KENALOG) 0.1 % ointment Apply twice a day to affected areas when rough, otherwise just on the weekends 454 g 1   ??? venlafaxine (EFFEXOR-XR) 37.5 MG 24 hr capsule Take 2 capsules (75 mg total) by mouth daily. 60 capsule 3   ??? gabapentin (NEURONTIN) 300 MG capsule Take 300 mg (1 capsule) in the morning and at 300 mg (1 capsule) at noon and 600 mg (2 capsules) at night. 360 capsule 0     No current facility-administered medications for this visit.     ROS  General weight loss, night sweats, fatigue  Cardiovascular chest pain, heart palpitations  Gastrointestinal nausea  Skin rash, itching, dryness  Endocrine thinning hair  Musculoskeletal muscle aches, joint aches/swelling, spasms  Neurologic dizzy/vertigo, coordination difficulty, headaches/migraines, numbness/tingling  Psychiatric depression, anxiety, panic attacks, agitation      Physical Exam    VITALS:   Vitals:    07/06/20 0821   BP: 144/78   Pulse: 82   Resp: 16   Temp: 36.2 ??C (97.2 ??F)   SpO2: 99%     Wt Readings from Last 3 Encounters:   07/06/20 (!) 103.1 kg (227 lb 3.2 oz)   06/22/20 (!) (P) 103.6 kg (228 lb 4.8 oz)   05/04/20 (!) 102.2 kg (225 lb 3.2 oz)     GENERAL:  The patient is a well developed, well-nourished  and appears to be in no apparent distress. The patient is pleasant and interactive. Patient is a good historian.  No evidence of sedation or intoxication.  HEAD/NECK:    Reveals normocephalic/atraumatic. clear sclera.   LUNGS:   Normal work of breathing  EXTREMITIES:  No clubbing, cyanosis noted.  NEUROLOGIC:    The patient was alert  and oriented, speech fluent, normal language.  MUSCULOSKELETAL:    Motor function preserved in all extremities with normal tone and bulk. Good range of motion of all extremities. The patient was able to ambulate without difficult throughout the clinic today without the assistance of a walking aid.  SKIN:   Visible skin is dry and intact without erythema or obvious lesions  PSY:  Appropriate Affect    I personally spent 37 minutes face-to-face and non-face-to-face in the care of this patient, which includes all pre, intra, and post visit time on the date of service.

## 2020-07-06 ENCOUNTER — Ambulatory Visit: Admit: 2020-07-06 | Discharge: 2020-07-07 | Payer: MEDICARE

## 2020-07-06 DIAGNOSIS — G63 Polyneuropathy in diseases classified elsewhere: Secondary | ICD-10-CM | POA: Diagnosis not present

## 2020-07-06 DIAGNOSIS — M8949 Other hypertrophic osteoarthropathy, multiple sites: Secondary | ICD-10-CM | POA: Diagnosis not present

## 2020-07-06 DIAGNOSIS — G8929 Other chronic pain: Secondary | ICD-10-CM | POA: Diagnosis not present

## 2020-07-06 DIAGNOSIS — M545 Chronic midline low back pain: Principal | ICD-10-CM

## 2020-07-06 DIAGNOSIS — M542 Cervicalgia: Secondary | ICD-10-CM | POA: Diagnosis not present

## 2020-07-06 DIAGNOSIS — G894 Chronic pain syndrome: Secondary | ICD-10-CM | POA: Diagnosis not present

## 2020-07-06 DIAGNOSIS — M797 Fibromyalgia: Secondary | ICD-10-CM | POA: Diagnosis not present

## 2020-07-06 DIAGNOSIS — Z7952 Long term (current) use of systemic steroids: Secondary | ICD-10-CM | POA: Diagnosis not present

## 2020-07-06 DIAGNOSIS — M48061 Spinal stenosis, lumbar region without neurogenic claudication: Secondary | ICD-10-CM | POA: Diagnosis not present

## 2020-07-06 MED ORDER — LAMOTRIGINE 200 MG TABLET
ORAL_TABLET | Freq: Two times a day (BID) | ORAL | 2 refills | 90.00000 days | Status: CP
Start: 2020-07-06 — End: 2020-10-10

## 2020-07-06 MED ORDER — CYCLOBENZAPRINE 5 MG TABLET
ORAL_TABLET | Freq: Two times a day (BID) | ORAL | 2 refills | 30 days | Status: CP | PRN
Start: 2020-07-06 — End: 2020-09-24

## 2020-07-06 MED ORDER — TRAZODONE 100 MG TABLET
ORAL_TABLET | Freq: Every evening | ORAL | 0 refills | 90.00000 days | Status: CP
Start: 2020-07-06 — End: 2020-10-04

## 2020-07-06 MED ORDER — CLONAZEPAM 0.5 MG TABLET
ORAL_TABLET | Freq: Every evening | ORAL | 2 refills | 30.00000 days | Status: CP
Start: 2020-07-06 — End: 2020-10-10

## 2020-07-06 MED ORDER — GABAPENTIN 300 MG CAPSULE
ORAL_CAPSULE | ORAL | 0 refills | 0.00000 days | Status: CP
Start: 2020-07-06 — End: 2020-09-24

## 2020-07-06 NOTE — Unmapped (Addendum)
It was good to see you today.    We will restart gabapentin.   Take 300 mg (1 capsule) three times daily for one week.   The next week, take 300 mg (1 capsule) in the morning and 300 mg (1 capsule) at noon and 600 mg (2 capsules) at night.    We will see you in 3 months, or sooner if needed.

## 2020-07-10 ENCOUNTER — Encounter: Payer: Self-pay | Admitting: Family Medicine

## 2020-07-13 NOTE — Patient Instructions (Signed)
Thank you for allowing the Chronic Care Management team to participate in your care.  Goals Addressed            This Visit's Progress   . Chronic Disease Management       CARE PLAN ENTRY (see longitudinal plan of care for additional care plan information)  Current Barriers:  . Chronic Disease Management support and education needs related to Hypertension, Asthma, GERD, Chronic Kidney Disease, Dyslipidemia and Systemic lupus erythematosus (lung involvement)  Nurse Case Manager Clinical Goal(s):  Marland Kitchen Over the next 90 days, patient will not require hospitalization or emergent evaluation d/t chronic illnesses. . Over the next 90 days, patient will attend all medical appointments.-Complete . Over the next 90 days, patient will take all medications as prescribed.-Complete . Over the next 90 days, patient will weigh at least once a week and record readings.-Complete . Over the next 90 days, patient will monitor blood pressure daily and record readings. . Over the next 90 days, patient will follow recommended safety measures to prevent falls and injuries.-Complete . Over the next 10 days, patient will work with Care Guides to discuss options for transportation assistance.-Complete  Interventions:  . Inter-disciplinary care team collaboration (see longitudinal plan of care) . Discussed plan for ongoing care management . Ms. Bettendorf remains compliant with treatment recommendations but reports several episodes of pain in her right hand over the past few weeks. Reports the previously prescribed Lyrica was discontinued and she will follow up with Pain Management. Her activity level has decreased and she reports going to the gym less d/t pain. Reports following recommended safety precautions. Denies falls. Declined need for additional in-home assistance but will follow-up if activity level continues to decline. She remains motivated to reach her health goals. We will plan to follow up again next month to  discuss updates.   Patient Self Care Activities:  . Self administers medications  . Attends scheduled provider appointments . Calls pharmacy for medication refills . Attends church or other social activities . Performs ADL's independently . Performs IADL's independently . Calls provider office for new concerns or questions   Please see past updates related to this goal by clicking on the "Past Updates" button in the selected goal         Ms. Wauhillau verbalized understanding of the information discussed during the telephonic outreach today. Declined need for a mailed/printed copy of the information.    A member of the care management team will follow-up with Ms. Edgar next month.     [facility] [phone]

## 2020-07-27 ENCOUNTER — Ambulatory Visit: Payer: Self-pay

## 2020-07-29 NOTE — Unmapped (Signed)
[facility] Refill Coordination Note    Specialty Medication(s) to be Shipped:   Inflammatory Disorders: mycophenolate    Other medication(s) to be shipped: No additional medications requested for fill at this time     Caitlin Smith, DOB: 1955/09/11  Phone: [phone] (home)       All above HIPAA information was verified with patient.     Was a Nurse, learning disability used for this call? No    Completed refill call assessment today to schedule patient's medication shipment from the [facility] [phone]).       Specialty medication(s) and dose(s) confirmed: Regimen is correct and unchanged.   Changes to medications: [place] reports no changes at this time.  Changes to insurance: No  Questions for the pharmacist: No    Confirmed patient received Welcome Packet with first shipment. The patient will receive a drug information handout for each medication shipped and additional FDA Medication Guides as required.       DISEASE/MEDICATION-SPECIFIC INFORMATION        N/A    SPECIALTY MEDICATION ADHERENCE     Medication Adherence    Patient reported X missed doses in the last month: 0  Specialty Medication: mycophenolate  Patient is on additional specialty medications: No  Patient is on more than two specialty medications: No  Any gaps in refill history greater than 2 weeks in the last 3 months: no  Demonstrates understanding of importance of adherence: yes  Informant: patient                Mycophenolate 360mg : Patient has 14 days of medication on hand      SHIPPING     Shipping address confirmed in Epic.     Delivery Scheduled: Yes, Expected medication delivery date: 12/6.     Medication will be delivered via Same Day Courier to the prescription address in Epic WAM.    Olga Millers   [facility] Specialty Technician

## 2020-08-04 ENCOUNTER — Encounter: Admit: 2020-08-04 | Discharge: 2020-08-05 | Payer: MEDICARE

## 2020-08-04 ENCOUNTER — Encounter: Admit: 2020-08-04 | Discharge: 2020-08-05 | Payer: MEDICARE | Attending: Psychologist | Primary: Psychologist

## 2020-08-04 DIAGNOSIS — L408 Other psoriasis: Secondary | ICD-10-CM | POA: Diagnosis not present

## 2020-08-04 DIAGNOSIS — M329 Systemic lupus erythematosus, unspecified: Secondary | ICD-10-CM | POA: Diagnosis not present

## 2020-08-04 DIAGNOSIS — R079 Chest pain, unspecified: Secondary | ICD-10-CM | POA: Diagnosis not present

## 2020-08-04 DIAGNOSIS — Z79899 Other long term (current) drug therapy: Secondary | ICD-10-CM | POA: Diagnosis not present

## 2020-08-04 DIAGNOSIS — M797 Fibromyalgia: Secondary | ICD-10-CM | POA: Diagnosis not present

## 2020-08-04 DIAGNOSIS — J8410 Pulmonary fibrosis, unspecified: Principal | ICD-10-CM

## 2020-08-04 DIAGNOSIS — F411 Generalized anxiety disorder: Principal | ICD-10-CM

## 2020-08-04 DIAGNOSIS — F331 Major depressive disorder, recurrent, moderate: Principal | ICD-10-CM

## 2020-08-04 DIAGNOSIS — F410 Panic disorder [episodic paroxysmal anxiety] without agoraphobia: Principal | ICD-10-CM

## 2020-08-04 DIAGNOSIS — F4310 Post-traumatic stress disorder, unspecified: Principal | ICD-10-CM

## 2020-08-04 MED ORDER — HYDROXYCHLOROQUINE 200 MG TABLET
ORAL_TABLET | Freq: Two times a day (BID) | ORAL | 3 refills | 90 days | Status: CP
Start: 2020-08-04 — End: ?

## 2020-08-04 NOTE — Unmapped (Addendum)
Call to schedule your eye exam at [phone].  Call pulmonology to schedule follow-up.  Contact her dermatologist to discuss your worsening rashes with change to triamcinolone cream.  Referral made to cardiology here at [facility].
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
## 2020-08-06 NOTE — Chronic Care Management (AMB) (Signed)
  Chronic Care Management   Note   Name: Nicole Ballard MRN: [id number] DOB: Jul 25, 1956   Nicole Ballard was scheduled for routine outreach today. She was busy at the time of the call. Reports doing well but has concerns regarding recurring constipation. Denies severe abdominal pain. She plans to try magnesium later today for temporary relief. We discussed worsening symptoms and indications for seeking immediate medical attention. She is agreeable to care management outreach within the next two weeks.   Follow up plan: A member of the care management team will follow-up with Nicole Ballard within the next two months.    [facility] [phone]

## 2020-08-10 MED FILL — MYCOPHENOLATE SODIUM 360 MG TABLET,DELAYED RELEASE: 90 days supply | Qty: 180 | Fill #1 | Status: AC

## 2020-08-10 MED FILL — MYCOPHENOLATE SODIUM 360 MG TABLET,DELAYED RELEASE: ORAL | 90 days supply | Qty: 180 | Fill #1

## 2020-08-13 ENCOUNTER — Other Ambulatory Visit: Payer: Self-pay

## 2020-08-13 ENCOUNTER — Ambulatory Visit (INDEPENDENT_AMBULATORY_CARE_PROVIDER_SITE_OTHER): Payer: Medicare Other

## 2020-08-13 VITALS — BP 148/88 | HR 85 | Temp 99.0°F | Resp 16 | Ht 68.0 in | Wt 225.6 lb

## 2020-08-13 DIAGNOSIS — Z0000 Encounter for general adult medical examination without abnormal findings: Secondary | ICD-10-CM

## 2020-08-13 NOTE — Patient Instructions (Signed)
Nicole Ballard , Thank you for taking time to come for your Medicare Wellness Visit. I appreciate your ongoing commitment to your health goals. Please review the following plan we discussed and let me know if I can assist you in the future.   Screening recommendations/referrals: Colonoscopy: done 08/23/13. Repeat in 2024 Mammogram: done 05/04/20 Bone Density: done 12/14/17 Recommended yearly ophthalmology/optometry visit for glaucoma screening and checkup Recommended yearly dental visit for hygiene and checkup  Vaccinations: Influenza vaccine: done 06/15/20 Pneumococcal vaccine: done 08/04/16 Tdap vaccine: due Shingles vaccine: please discuss with your provider at next office visit  Covid-19: done; please bring a copy of your vaccine record to your next appt  Advanced directives: Advance directive discussed with you today. I have provided a copy for you to complete at home and have notarized. Once this is complete please bring a copy in to our office so we can scan it into your chart.  Conditions/risks identified: Recommend drinking 6-8 glasses of water per day   Next appointment: Follow up in one year for your annual wellness visit.   Preventive Care 40-64 Years, Female Preventive care refers to lifestyle choices and visits with your health care provider that can promote health and wellness. What does preventive care include?  A yearly physical exam. This is also called an annual well check.  Dental exams once or twice a year.  Routine eye exams. Ask your health care provider how often you should have your eyes checked.  Personal lifestyle choices, including:  Daily care of your teeth and gums.  Regular physical activity.  Eating a healthy diet.  Avoiding tobacco and drug use.  Limiting alcohol use.  Practicing safe sex.  Taking low-dose aspirin daily starting at age 55.  Taking vitamin and mineral supplements as recommended by your health care provider. What happens  during an annual well check? The services and screenings done by your health care provider during your annual well check will depend on your age, overall health, lifestyle risk factors, and family history of disease. Counseling  Your health care provider may ask you questions about your:  Alcohol use.  Tobacco use.  Drug use.  Emotional well-being.  Home and relationship well-being.  Sexual activity.  Eating habits.  Work and work Statistician.  Method of birth control.  Menstrual cycle.  Pregnancy history. Screening  You may have the following tests or measurements:  Height, weight, and BMI.  Blood pressure.  Lipid and cholesterol levels. These may be checked every 5 years, or more frequently if you are over 68 years old.  Skin check.  Lung cancer screening. You may have this screening every year starting at age 60 if you have a 30-pack-year history of smoking and currently smoke or have quit within the past 15 years.  Fecal occult blood test (FOBT) of the stool. You may have this test every year starting at age 19.  Flexible sigmoidoscopy or colonoscopy. You may have a sigmoidoscopy every 5 years or a colonoscopy every 10 years starting at age 40.  Hepatitis C blood test.  Hepatitis B blood test.  Sexually transmitted disease (STD) testing.  Diabetes screening. This is done by checking your blood sugar (glucose) after you have not eaten for a while (fasting). You may have this done every 1-3 years.  Mammogram. This may be done every 1-2 years. Talk to your health care provider about when you should start having regular mammograms. This may depend on whether you have a family history of breast  cancer.  BRCA-related cancer screening. This may be done if you have a family history of breast, ovarian, tubal, or peritoneal cancers.  Pelvic exam and Pap test. This may be done every 3 years starting at age 28. Starting at age 89, this may be done every 5 years if you  have a Pap test in combination with an HPV test.  Bone density scan. This is done to screen for osteoporosis. You may have this scan if you are at high risk for osteoporosis. Discuss your test results, treatment options, and if necessary, the need for more tests with your health care provider. Vaccines  Your health care provider may recommend certain vaccines, such as:  Influenza vaccine. This is recommended every year.  Tetanus, diphtheria, and acellular pertussis (Tdap, Td) vaccine. You may need a Td booster every 10 years.  Zoster vaccine. You may need this after age 53.  Pneumococcal 13-valent conjugate (PCV13) vaccine. You may need this if you have certain conditions and were not previously vaccinated.  Pneumococcal polysaccharide (PPSV23) vaccine. You may need one or two doses if you smoke cigarettes or if you have certain conditions. Talk to your health care provider about which screenings and vaccines you need and how often you need them. This information is not intended to replace advice given to you by your health care provider. Make sure you discuss any questions you have with your health care provider. Document Released: 09/18/2015 Document Revised: 05/11/2016 Document Reviewed: 06/23/2015 Elsevier Interactive Patient Education  2017 [employer] Prevention in the Home Falls can cause injuries. They can happen to people of all ages. There are many things you can do to make your home safe and to help prevent falls. What can I do on the outside of my home?  Regularly fix the edges of walkways and driveways and fix any cracks.  Remove anything that might make you trip as you walk through a door, such as a raised step or threshold.  Trim any bushes or trees on the path to your home.  Use bright outdoor lighting.  Clear any walking paths of anything that might make someone trip, such as rocks or tools.  Regularly check to see if handrails are loose or broken.  Make sure that both sides of any steps have handrails.  Any raised decks and porches should have guardrails on the edges.  Have any leaves, snow, or ice cleared regularly.  Use sand or salt on walking paths during winter.  Clean up any spills in your garage right away. This includes oil or grease spills. What can I do in the bathroom?  Use night lights.  Install grab bars by the toilet and in the tub and shower. Do not use towel bars as grab bars.  Use non-skid mats or decals in the tub or shower.  If you need to sit down in the shower, use a plastic, non-slip stool.  Keep the floor dry. Clean up any water that spills on the floor as soon as it happens.  Remove soap buildup in the tub or shower regularly.  Attach bath mats securely with double-sided non-slip rug tape.  Do not have throw rugs and other things on the floor that can make you trip. What can I do in the bedroom?  Use night lights.  Make sure that you have a light by your bed that is easy to reach.  Do not use any sheets or blankets that are too big for your  bed. They should not hang down onto the floor.  Have a firm chair that has side arms. You can use this for support while you get dressed.  Do not have throw rugs and other things on the floor that can make you trip. What can I do in the kitchen?  Clean up any spills right away.  Avoid walking on wet floors.  Keep items that you use a lot in easy-to-reach places.  If you need to reach something above you, use a strong step stool that has a grab bar.  Keep electrical cords out of the way.  Do not use floor polish or wax that makes floors slippery. If you must use wax, use non-skid floor wax.  Do not have throw rugs and other things on the floor that can make you trip. What can I do with my stairs?  Do not leave any items on the stairs.  Make sure that there are handrails on both sides of the stairs and use them. Fix handrails that are broken or  loose. Make sure that handrails are as long as the stairways.  Check any carpeting to make sure that it is firmly attached to the stairs. Fix any carpet that is loose or worn.  Avoid having throw rugs at the top or bottom of the stairs. If you do have throw rugs, attach them to the floor with carpet tape.  Make sure that you have a light switch at the top of the stairs and the bottom of the stairs. If you do not have them, ask someone to add them for you. What else can I do to help prevent falls?  Wear shoes that:  Do not have high heels.  Have rubber bottoms.  Are comfortable and fit you well.  Are closed at the toe. Do not wear sandals.  If you use a stepladder:  Make sure that it is fully opened. Do not climb a closed stepladder.  Make sure that both sides of the stepladder are locked into place.  Ask someone to hold it for you, if possible.  Clearly mark and make sure that you can see:  Any grab bars or handrails.  First and last steps.  Where the edge of each step is.  Use tools that help you move around (mobility aids) if they are needed. These include:  Canes.  Walkers.  Scooters.  Crutches.  Turn on the lights when you go into a dark area. Replace any light bulbs as soon as they burn out.  Set up your furniture so you have a clear path. Avoid moving your furniture around.  If any of your floors are uneven, fix them.  If there are any pets around you, be aware of where they are.  Review your medicines with your doctor. Some medicines can make you feel dizzy. This can increase your chance of falling. Ask your doctor what other things that you can do to help prevent falls. This information is not intended to replace advice given to you by your health care provider. Make sure you discuss any questions you have with your health care provider. Document Released: 06/18/2009 Document Revised: 01/28/2016 Document Reviewed: 09/26/2014 Elsevier Interactive  Patient Education  2017 [employer].

## 2020-08-13 NOTE — Progress Notes (Signed)
Subjective:   Nicole Ballard is a 64 y.o. female who presents for Medicare Annual (Subsequent) preventive examination.  Review of Systems     Cardiac Risk Factors include: dyslipidemia;hypertension;obesity (BMI >30kg/m2)     Objective:    Today's Vitals   08/13/20 1006 08/13/20 1008  BP: (!) 148/88   Pulse: 85   Resp: 16   Temp: 99 F (37.2 C)   TempSrc: Oral   SpO2: 98%   Weight: 225 lb 9.6 oz (102.3 kg)   Height: _0  (1.727 m)   PainSc:  5    Body mass index is 34.3 kg/m.  Advanced Directives 08/13/2020 08/09/2019 01/21/2019 04/23/2018 11/09/2017 06/29/2017 06/23/2017  Does Patient Have a Medical Advance Directive? _1  No No  Would patient like information on creating a medical advance directive? Yes (MAU/Ambulatory/Procedural Areas - Information given) Yes (MAU/Ambulatory/Procedural Areas - Information given) No - Patient declined No - Patient declined No - Patient declined - -    Current Medications (verified) Outpatient Encounter Medications as of 08/13/2020  Medication Sig  . amLODipine (NORVASC) 5 MG tablet Take 1 tablet (5 mg total) by mouth daily.  Marland Kitchen aspirin EC 81 MG tablet Take 1 tablet (81 mg total) by mouth daily.  Marland Kitchen atorvastatin (LIPITOR) 40 MG tablet TAKE 1 TABLET BY MOUTH  DAILY  . Biotin 1 MG CAPS Take 1 capsule by mouth daily.  Marland Kitchen buPROPion (WELLBUTRIN XL) 150 MG 24 hr tablet Take 150 mg by mouth every morning.  . Cholecalciferol (VITAMIN D) 2000 UNITS CAPS Take 1 capsule by mouth daily.  . clobetasol ointment (TEMOVATE) 0.05 % Apply topically.  . clonazePAM (KLONOPIN) 0.5 MG tablet Take 0.5 mg by mouth daily.  . clotrimazole-betamethasone (LOTRISONE) cream Apply 1 application topically 2 (two) times daily.  . cyclobenzaprine (FLEXERIL) 5 MG tablet TAKE 1 TABLET BY MOUTH  EVERY 8 HOURS AS NEEDED FOR MUSCLE SPASMS. DO NOT DRIVE FOR 8 HOURS  . diclofenac sodium (VOLTAREN) 1 % GEL APP 2 GRAMS EXT AA QID  . fluticasone (FLONASE) 50 MCG/ACT nasal  spray Place 2 sprays into both nostrils daily.  Marland Kitchen gabapentin (NEURONTIN) 300 MG capsule Take 300 mg (1 capsule) in the morning and at 300 mg (1 capsule) at noon and 600 mg (2 capsules) at night.  . hydroxychloroquine (PLAQUENIL) 200 MG tablet Take by mouth 2 (two) times daily.  Marland Kitchen lamoTRIgine (LAMICTAL) 200 MG tablet Take 200 mg by mouth 2 (two) times daily.  . meclizine (ANTIVERT) 25 MG tablet Take 1 tablet (25 mg total) by mouth 3 (three) times daily as needed.  . Melatonin 10 MG TABS Take 1 tablet by mouth at bedtime.  . mycophenolate (MYFORTIC) 360 MG TBEC EC tablet Take 360 mg by mouth 2 (two) times daily.  Marland Kitchen olopatadine (PATANOL) 0.1 % ophthalmic solution Place 1 drop into both eyes 2 (two) times daily.  Marland Kitchen omeprazole (PRILOSEC) 40 MG capsule TAKE 1 CAPSULE BY MOUTH  DAILY  . Polyethylene Glycol 3350 (MIRALAX PO) Take by mouth.  . telmisartan (MICARDIS) 80 MG tablet Take 1 tablet (80 mg total) by mouth daily.  . traZODone (DESYREL) 100 MG tablet Take 150 mg by mouth at bedtime.  . triamcinolone ointment (KENALOG) 0.1 %   . venlafaxine XR (EFFEXOR-XR) 37.5 MG 24 hr capsule Take 37.5 mg by mouth 2 (two) times daily.  . [DISCONTINUED] DULoxetine (CYMBALTA) 60 MG capsule Take 60 mg by mouth at bedtime.  . [DISCONTINUED] ondansetron (ZOFRAN) 4 MG tablet  TAKE 1 TABLET BY MOUTH  EVERY 8 HOURS AS NEEDED FOR NAUSEA AND VOMITING   No facility-administered encounter medications on file as of 08/13/2020.    Allergies (verified) Ace inhibitors, Bee pollen, Penicillins, and Pollen extract   History: Past Medical History:  Diagnosis Date  . Anxiety   . Bipolar 1 disorder ([place])   . Chronic kidney disease   . Chronic pain   . Diabetes mellitus without complication ([place])   . Fibromyalgia   . GERD (gastroesophageal reflux disease)   . Hearing loss of right ear   . Hemifacial spasm   . Hyperlipidemia   . Hypertension   . Insomnia   . Lumbago   . Osteoarthritis   . Paresthesia   . Rhinitis    . Systemic lupus erythematosus ([place])   . Vitamin D deficiency    Past Surgical History:  Procedure Laterality Date  . BRAIN SURGERY    . CHOLECYSTECTOMY    . VAGINAL HYSTERECTOMY     Family History  Problem Relation Age of Onset  . Breast cancer Mother   . Cancer Mother 97       Breast  . Cancer Father 59       Stomach  . Breast cancer Maternal Aunt    Social History   Socioeconomic History  . Marital status: Divorced    Spouse name: Not on file  . Number of children: 2  . Years of education: Not on file  . Highest education level: GED or equivalent  Occupational History  . Occupation: Disability  Tobacco Use  . Smoking status: Never Smoker  . Smokeless tobacco: Never Used  Vaping Use  . Vaping Use: Never used  Substance and Sexual Activity  . Alcohol use: No    Alcohol/week: 0.0 standard drinks  . Drug use: No  . Sexual activity: Not Currently    Partners: Male  Other Topics Concern  . Not on file  Social History Narrative   Pt lives alone   Social Determinants of Health   Financial Resource Strain: Low Risk   . Difficulty of Paying Living Expenses: Not hard at all  Food Insecurity: No Food Insecurity  . Worried About Charity fundraiser in the Last Year: Never true  . Ran Out of Food in the Last Year: Never true  Transportation Needs: No Transportation Needs  . Lack of Transportation (Medical): No  . Lack of Transportation (Non-Medical): No  Physical Activity: Inactive  . Days of Exercise per Week: 0 days  . Minutes of Exercise per Session: 0 min  Stress: Stress Concern Present  . Feeling of Stress : Very much  Social Connections: Moderately Integrated  . Frequency of Communication with Friends and Family: More than three times a week  . Frequency of Social Gatherings with Friends and Family: Once a week  . Attends Religious Services: More than 4 times per year  . Active Member of Clubs or Organizations: Yes  . Attends Archivist Meetings:  More than 4 times per year  . Marital Status: Divorced    Tobacco Counseling Counseling given: Not Answered   Clinical Intake:  Pre-visit preparation completed: Yes  Pain : 0-10 Pain Score: 5  Pain Type: Chronic pain Pain Location: Back Pain Orientation: Lower Pain Descriptors / Indicators: Aching,Dull,Discomfort Pain Onset: More than a month ago Pain Frequency: Constant     BMI - recorded: 34.3 Nutritional Status: BMI > 30  Obese Nutritional Risks: None Diabetes: No  How often  do you need to have someone help you when you read instructions, pamphlets, or other written materials from your doctor or pharmacy?: 1 - Never   Interpreter Needed?: No  Information entered by :: Clemetine Marker LPN   Activities of Daily Living In your present state of health, do you have any difficulty performing the following activities: 08/13/2020 06/15/2020  Hearing? Y Y  Comment declines hearing aids -  Vision? N N  Difficulty concentrating or making decisions? Tempie Donning  Walking or climbing stairs? Y Y  Comment - -  Dressing or bathing? N N  Doing errands, shopping? N N  Preparing Food and eating ? N -  Using the Toilet? N -  In the past six months, have you accidently leaked urine? Y -  Comment wears pads/depends for protection when needed -  Do you have problems with loss of bowel control? N -  Managing your Medications? N -  Managing your Finances? N -  Housekeeping or managing your Housekeeping? N -  Some recent data might be hidden    Patient Care Team: Steele Sizer, MD as PCP - General (Family Medicine) Blima Dessert, MD as Referring Physician (Rheumatology) Dorinsky, Carey Bullocks, MD as Consulting Physician (Pulmonary Disease) Justin Mend, MD as Attending Physician (Nephrology) Dimple Nanas, MD as Referring Physician (Gastroenterology) Derald Macleod, MD as Consulting Physician (Nephrology) Isaias Cowman, MD as Consulting Physician (Cardiology) Lady Saucier, MD as Referring Physician (Psychiatry) Lucius Conn Annice Needy, MD as Referring Physician (Otolaryngology) Novella Olive, MD as Referring Physician (Dermatology) Goetzinger, Lurlean Leyden, PhD as Referring Physician (Psychology)  Indicate any recent Medical Services you may have received from other than [facility] providers in the past year (date may be approximate).     Assessment:   This is a routine wellness examination for Gayl.  Hearing/Vision screen  Hearing Screening   _0  _1  _2  _3  _4  _5  _6  _7  _8   Right ear:           Left ear:           Comments: Pt c/o being deaf in her right ear  Vision Screening Comments: [facility] screen[facility]; due for exam   Dietary issues and exercise activities discussed: Current Exercise Habits: The patient does not participate in regular exercise at present, Exercise limited by: neurologic condition(s);orthopedic condition(s)  Goals    .  Chronic Disease Management      CARE PLAN ENTRY (see longitudinal plan of care for additional care plan information)  Current Barriers:  . Chronic Disease Management support and education needs related to Hypertension, Asthma, GERD, Chronic Kidney Disease, Dyslipidemia and Systemic lupus erythematosus (lung involvement)  Nurse Case Manager Clinical Goal(s):  Marland Kitchen Over the next 90 days, patient will not require hospitalization or emergent evaluation d/t chronic illnesses. . Over the next 90 days, patient will attend all medical appointments.-Complete . Over the next 90 days, patient will take all medications as prescribed.-Complete . Over the next 90 days, patient will weigh at least once a week and record readings.-Complete . Over the next 90 days, patient will monitor blood pressure daily and record readings. . Over the next 90 days, patient will follow recommended safety measures to prevent falls and injuries.-Complete . Over the next 10 days, patient will work with  Care Guides to discuss options for transportation assistance.-Complete  Interventions:  . Inter-disciplinary care team collaboration (see longitudinal plan of care) . Discussed plan for ongoing care management . Ms. Llorente remains compliant  with treatment recommendations but reports several episodes of pain in her right hand over the past few weeks. Reports the previously prescribed Lyrica was discontinued and she will follow up with Pain Management. Her activity level has decreased and she reports going to the gym less d/t pain. Reports following recommended safety precautions. Denies falls. Declined need for additional in-home assistance but will follow-up if activity level continues to decline. She remains motivated to reach her health goals. We will plan to follow up again next month to discuss updates.   Patient Self Care Activities:  . Self administers medications  . Attends scheduled provider appointments . Calls pharmacy for medication refills . Attends church or other social activities . Performs ADL's independently . Performs IADL's independently . Calls provider office for new concerns or questions   Please see past updates related to this goal by clicking on the "Past Updates" button in the selected goal      .  DIET - INCREASE WATER INTAKE      Recommend drinking 6-8 glasses of water per day     .  I need to see a dentist but I just can't afford all the work (pt-stated)      Current Barriers:  Marland Kitchen Knowledge Deficits related to available dental clinics in the area . Knowledge Deficits related to dental benefits through her current [facility] Medicare plan . Film/video editor.   Nurse Case Manager Clinical Goal(s):  Marland Kitchen Over the next 14 days, patient will contact resources provided to get a quote on dental work needed- goal met 04/12/2019 . Over the next 14 days, patient will contact resources provided for ongoing dental care/cleanings  Interventions:   . Provided patient with a  list of [place] in network dental providers in the [facility] area  [place], [place] [address] Phone: [phone]  [place] [address] [place], [place] [address] Phone: [phone]  [place] [address] [place], [place] [address] Phone: [phone]  [place] [address], [place] [address] Phone: [phone]   Patient Self Care Activities:  . Call and schedule dental exam . Take all medications as prescribed . Attend all medical and dental appointment   Initial goal documentation       Depression Screen PHQ 2/9 Scores 08/13/2020 06/15/2020 02/14/2020 01/24/2020 11/11/2019 11/07/2019 10/09/2019  PHQ - 2 Score _0 PHQ- 9 Score _1 - _2 Fall Risk Fall Risk  08/13/2020 06/15/2020 02/14/2020 01/24/2020 11/11/2019  Falls in the past year? 0 0 0 0 0  Number falls in past yr: 0 0 0 - 0  Comment - - - - -  Injury with Fall? 0 0 0 - 0  Risk for fall due to : No Fall Risks - - Medication side effect -  Risk for fall due to: Comment - - - - -  Follow up Falls prevention discussed - Falls evaluation completed Falls prevention discussed -    FALL RISK PREVENTION PERTAINING TO THE HOME:  Any stairs in or around the home? Yes  If so, are there any without handrails? No  Home free of loose throw rugs in walkways, pet beds, electrical cords, etc? Yes  Adequate lighting in your home to reduce risk of falls? Yes   ASSISTIVE DEVICES UTILIZED TO PREVENT FALLS:  Life alert? No  Use of a cane, walker or w/c? No  Grab bars in the bathroom? No  Shower chair or bench in shower? No  Elevated toilet seat or a handicapped toilet? No   TIMED UP AND GO:  Was the test performed? Yes .  Length of time to ambulate 10 feet: 5 sec.   Gait steady and fast without use of assistive device  Cognitive Function:     6CIT Screen 08/13/2020 08/09/2019  What  Year? 0 points 0 points  What month? 0 points 0 points  What time? 0 points 0 points  Count back from 20 0 points 0 points  Months in reverse 2 points 2 points  Repeat phrase 2 points 0 points  Total Score 4 2    Immunizations Immunization History  Administered Date(s) Administered  . Hepatitis A, Adult 08/12/2015, 02/10/2016  . Hepatitis B, adult 06/26/2007, 08/12/2015, 10/13/2015, 02/10/2016  . Influenza Split 06/01/2010, 05/14/2013, 04/19/2014  . Influenza, Seasonal, Injecte, Preservative Fre 06/26/2007, 06/02/2011, 12/01/2011, 06/09/2013  . Influenza,inj,Quad PF,6+ Mos 04/16/2015, 05/26/2016, 05/17/2017, 05/14/2018, 06/12/2019, 06/15/2020  . MMR 06/26/2007  . Pneumococcal Conjugate-13 08/06/2015, 05/26/2016  . Pneumococcal Polysaccharide-23 02/22/2010, 08/04/2016  . Tdap 06/26/2007, 02/22/2010    TDAP status: Due, Education has been provided regarding the importance of this vaccine. Advised may receive this vaccine at local pharmacy or Health Dept. Aware to provide a copy of the vaccination record if obtained from local pharmacy or Health Dept. Verbalized acceptance and understanding.  Flu Vaccine status: Up to date  Pneumococcal vaccine status: Up to date  Covid-19 vaccine status: Completed vaccines. Pt advised to bring copy of vaccine record to next appt.   Qualifies for Shingles Vaccine? Yes   Zostavax completed No   Shingrix Completed?: No.    Education has been provided regarding the importance of this vaccine. Patient has been advised to call insurance company to determine out of pocket expense if they have not yet received this vaccine. Advised may also receive vaccine at local pharmacy or Health Dept. Verbalized acceptance and understanding.  Screening Tests Health Maintenance  Topic Date Due  . COVID-19 Vaccine (1) Never done  . TETANUS/TDAP  02/23/2020  . OPHTHALMOLOGY EXAM  06/17/2020  . HEMOGLOBIN A1C  12/14/2020  . PAP SMEAR-Modifier  03/29/2021  .  MAMMOGRAM  05/04/2021  . FOOT EXAM  06/15/2021  . COLONOSCOPY  08/24/2023  . INFLUENZA VACCINE  Completed  . PNEUMOCOCCAL POLYSACCHARIDE VACCINE AGE 72-64 HIGH RISK  Completed  . Hepatitis C Screening  Completed  . HIV Screening  Completed    Health Maintenance  Health Maintenance Due  Topic Date Due  . COVID-19 Vaccine (1) Never done  . TETANUS/TDAP  02/23/2020  . OPHTHALMOLOGY EXAM  06/17/2020    Colorectal cancer screening: Type of screening: Colonoscopy. Completed 08/23/13. Repeat every 10 years  Mammogram status: Completed 05/04/20. Repeat every year  Bone Density status: Completed 12/14/17. Results reflect: Bone density results: NORMAL. Repeat every 3-5 years.  Lung Cancer Screening: (Low Dose CT Chest recommended if Age 37-80 years, 30 pack-year currently smoking OR have quit w/in 15years.) does not qualify.   Additional Screening:  Hepatitis C Screening: does qualify; Completed 04/02/12  Vision Screening: Recommended annual ophthalmology exams for early detection of glaucoma and other disorders of the eye. Is the patient up to date with their annual eye exam?  No  Who is the provider or what is the name of the office in which the patient attends annual eye exams? [facility]  Dental Screening:  Recommended annual dental exams for proper oral hygiene  Community Resource Referral / Chronic Care Management: CRR required this visit?  No   CCM required this visit?  No      Plan:     I have personally reviewed and noted the following in the patient's chart:   . Medical and social history . Use of alcohol, tobacco or illicit drugs  . Current medications and supplements . Functional ability and status . Nutritional status . Physical activity . Advanced directives . List of other physicians . Hospitalizations, surgeries, and ER visits in previous 12 months . Vitals . Screenings to include cognitive, depression, and falls . Referrals and appointments  In addition, I have  reviewed and discussed with patient certain preventive protocols, quality metrics, and best practice recommendations. A written personalized care plan for preventive services as well as general preventive health recommendations were provided to patient.     Clemetine Marker, LPN   [id number]/05/[id number]   Nurse Notes: attempted to update patient's care team list but she sees several residents through programs at [facility] and has several specialty providers that she does not see as often. She is overall doing well and appreciative of visit today.

## 2020-08-19 ENCOUNTER — Other Ambulatory Visit: Payer: Self-pay | Admitting: Family Medicine

## 2020-08-19 DIAGNOSIS — R110 Nausea: Secondary | ICD-10-CM

## 2020-08-19 DIAGNOSIS — R42 Dizziness and giddiness: Secondary | ICD-10-CM

## 2020-08-19 NOTE — Telephone Encounter (Signed)
Requested medication (s) are due for refill today: yes  Requested medication (s) are on the active medication list: yes  Last refill:  Meclizine 10/09/19 #30  Future visit scheduled: yes  Notes to clinic:  Please review for refill. Refills not delegated per protocol    Requested Prescriptions  Pending Prescriptions Disp Refills   meclizine (ANTIVERT) 25 MG tablet [Pharmacy Med Name: MECLIZINE  25MG   TAB] 30 tablet 0    Sig: TAKE 1 TABLET BY MOUTH 3  TIMES DAILY AS NEEDED      Not Delegated - Gastroenterology: Antiemetics Failed - 08/19/2020  4:23 PM      Failed - This refill cannot be delegated      Passed - Valid encounter within last 6 months    Recent Outpatient Visits           2 months ago Diabetes mellitus type 2 in obese [facility])   [facility] Steele Sizer, MD   6 months ago Benign hypertension   [facility] Sizer, MD   9 months ago Right ear pain   [facility] Sizer, MD   9 months ago Right ear pain   [facility] [place], Drue Stager, MD   10 months ago Diabetes mellitus type 2 in obese [facility])   [facility], [facility]       Future Appointments             In 3 months Ancil Boozer, Drue Stager, MD [facility], [place]   In 12 months  [facility] [place]               ondansetron (ZOFRAN) 4 MG tablet [Pharmacy Med Name: ONDANSETRON  4MG   TAB] 90 tablet     Sig: TAKE 1 TABLET BY MOUTH  EVERY 8 HOURS AS NEEDED FOR NAUSEA AND VOMITING      Not Delegated - Gastroenterology: Antiemetics Failed - 08/19/2020  4:23 PM      Failed - This refill cannot be delegated      Passed - Valid encounter within last 6 months    Recent Outpatient Visits           2 months ago Diabetes mellitus type 2 in obese [facility])   [facility]   6 months ago Benign hypertension   [facility], MD   9 months ago Right ear pain   [facility]izer, MD   9 months ago Right ear pain   [facility], [facility]ue Stager, MD   10 months ago Diabetes mellitus type 2 in obese [facility])   [facility]izer, MD       Future Appointments             In 3 months Steele Sizer, MD [facility], [place]   In 12 months  [facility]              Signed Prescriptions Disp Refills   telmisartan (MICARDIS) 80 MG tablet 90 tablet 1    Sig: TAKE 1 TABLET BY MOUTH  DAILY      Cardiovascular:  Angiotensin Receptor Blockers Failed - 08/19/2020  4:23 PM      Failed - Last BP in normal range    BP Readings from Last 1 Encounters:  08/13/20 (!) 148/88  Passed - Cr in normal range and within 180 days    Creat  Date Value Ref Range Status  06/15/2020 0.98 0.50 - 0.99 mg/dL Final    Comment:    For patients >46 years of age, the reference limit for Creatinine is approximately 13% higher for people identified as African-American. .    Creatinine, Urine  Date Value Ref Range Status  11/07/2019 64 20 - 275 mg/dL Final          Passed - K in normal range and within 180 days    Potassium  Date Value Ref Range Status  06/15/2020 5.1 3.5 - 5.3 mmol/L Final  12/10/2013 3.7 3.5 - 5.1 mmol/L Final          Passed - Patient is not pregnant      Passed - Valid encounter within last 6 months    Recent Outpatient Visits           2 months ago Diabetes mellitus type 2 in obese [facility])   [facility]   6 months ago Benign hypertension   [facility], MD   9 months ago Right ear pain   [facility]   9 months ago Right ear pain   [facility], Drue Stager, MD   10 months ago Diabetes mellitus type 2  in obese [facility])   [facility] Sizer, MD       Future Appointments             In 3 months Ancil Boozer, Drue Stager, MD [facility], [place]   In 12 months  [facility]

## 2020-08-20 ENCOUNTER — Other Ambulatory Visit: Payer: Self-pay

## 2020-08-20 ENCOUNTER — Other Ambulatory Visit: Payer: Self-pay | Admitting: Family Medicine

## 2020-08-20 DIAGNOSIS — I10 Essential (primary) hypertension: Secondary | ICD-10-CM

## 2020-08-31 NOTE — Unmapped (Signed)
Patient has enough medication on hand, rescheduling refill call for 3/1

## 2020-09-03 ENCOUNTER — Encounter
Admit: 2020-09-03 | Discharge: 2020-09-04 | Payer: MEDICARE | Attending: Cardiovascular Disease | Primary: Cardiovascular Disease

## 2020-09-03 DIAGNOSIS — R079 Chest pain, unspecified: Secondary | ICD-10-CM | POA: Diagnosis not present

## 2020-09-03 DIAGNOSIS — Z7982 Long term (current) use of aspirin: Secondary | ICD-10-CM | POA: Diagnosis not present

## 2020-09-03 DIAGNOSIS — J45909 Unspecified asthma, uncomplicated: Secondary | ICD-10-CM | POA: Diagnosis not present

## 2020-09-03 DIAGNOSIS — E1169 Type 2 diabetes mellitus with other specified complication: Secondary | ICD-10-CM | POA: Diagnosis not present

## 2020-09-03 DIAGNOSIS — N181 Chronic kidney disease, stage 1: Secondary | ICD-10-CM | POA: Diagnosis not present

## 2020-09-03 DIAGNOSIS — E1159 Type 2 diabetes mellitus with other circulatory complications: Secondary | ICD-10-CM | POA: Diagnosis not present

## 2020-09-03 DIAGNOSIS — G894 Chronic pain syndrome: Secondary | ICD-10-CM | POA: Diagnosis not present

## 2020-09-03 DIAGNOSIS — K219 Gastro-esophageal reflux disease without esophagitis: Secondary | ICD-10-CM | POA: Diagnosis not present

## 2020-09-03 DIAGNOSIS — Z79899 Other long term (current) drug therapy: Secondary | ICD-10-CM | POA: Diagnosis not present

## 2020-09-03 DIAGNOSIS — I10 Essential (primary) hypertension: Secondary | ICD-10-CM | POA: Diagnosis not present

## 2020-09-03 DIAGNOSIS — I129 Hypertensive chronic kidney disease with stage 1 through stage 4 chronic kidney disease, or unspecified chronic kidney disease: Secondary | ICD-10-CM | POA: Diagnosis not present

## 2020-09-03 DIAGNOSIS — E1122 Type 2 diabetes mellitus with diabetic chronic kidney disease: Secondary | ICD-10-CM | POA: Diagnosis not present

## 2020-09-03 DIAGNOSIS — M797 Fibromyalgia: Secondary | ICD-10-CM | POA: Diagnosis not present

## 2020-09-03 DIAGNOSIS — I152 Hypertension secondary to endocrine disorders: Principal | ICD-10-CM

## 2020-09-03 DIAGNOSIS — Z6835 Body mass index (BMI) 35.0-35.9, adult: Principal | ICD-10-CM

## 2020-09-03 DIAGNOSIS — E669 Obesity, unspecified: Principal | ICD-10-CM

## 2020-09-03 DIAGNOSIS — F411 Generalized anxiety disorder: Principal | ICD-10-CM
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
## 2020-09-07 MED ORDER — GABAPENTIN 300 MG CAPSULE
ORAL_CAPSULE | 3 refills | 0 days
Start: 2020-09-07 — End: ?

## 2020-09-14 ENCOUNTER — Institutional Professional Consult (permissible substitution): Admit: 2020-09-14 | Discharge: 2020-09-15 | Payer: MEDICARE | Attending: Psychologist | Primary: Psychologist

## 2020-09-14 DIAGNOSIS — F4310 Post-traumatic stress disorder, unspecified: Principal | ICD-10-CM

## 2020-09-14 DIAGNOSIS — F410 Panic disorder [episodic paroxysmal anxiety] without agoraphobia: Principal | ICD-10-CM

## 2020-09-14 DIAGNOSIS — F411 Generalized anxiety disorder: Principal | ICD-10-CM

## 2020-09-14 NOTE — Unmapped (Signed)
[facility]   Confidential Psychological Therapy Session      Patient Name: Caitlin Smith  Medical Record Number: [id number]  Date of Service: September 14, 2020  Attending Psychologist: Caroline More, PhD  CPT Procedure Codes: 81191 for 60 mins of face to face counseling    Due to the current declared state emergency during the coronavirus pandemic, all non-urgent medical and mental health visits have been triaged to either be delayed or take place virtually via phone or videoconferencing.   Due to the need for continued patient interaction for mental health care, pain management, and care coordination that necessitates the involvement of this provider, this visit was performed face to face using interactive technology using a HIPPA compliant audio/visual platform. This patient will be scheduled for face to face visits in the future once it has been deemed safe.      We reviewed confidentiality today. The patient was present at home (location and contact information confirmed), attended this visit alone, and consented to this virtual pain psychology visit.     Visit modifiers:   GT for Interactive Technology and CR for catastrophe/disaster related due to coronavirus pandemic    REFERRING PHYSICIAN: Clarene Essex, MD    CHIEF COMPLAINT AND REASON FOR VISIT: pain coping skills, CBT to address depression and anxiety in the setting of chronic pain    SUBJECTIVE / HISTORY OF PRESENT ILLNESS: [name] is a very pleasant 65 y.o.  female from [place], [place] with multiple chronic pain complaints related to fibromyalgia and lupus who initially met with me in October 2016, and which time she was diagnosed with severe depression, PTSD, panic disorder, and generalized anxiety.The patient returns for a therapy session today. Last follow up with me was 08/04/20.     Patient participates actively throughout her therapy session today.  Overall she is doing well.  To process his thoughts and feelings related to COVID.  She largely has been staying home.  Her adult grandson has COVID and she is not sure as to his progress.  Apparently he did not wish to get vaccinated.  Patient processes this stressor.  Processes challenging relationship dynamics between family members.  Overall pain has been worse, she is not sure why, perhaps due to weather changes.  Spent extra time today practicing mindful breathing and guided imagery.    OBJECTIVE / MENTAL STATUS:    Appearance:   Appears stated age and Clean/Neat   Motor:  No abnormal movements   Speech/Language:   Normal rate, volume, tone, fluency   Mood:  Depressed and Anxious   Affect:  blunted   Thought process:  Logical, linear, clear, coherent, goal directed   Thought content:    Denies SI, HI, self harm, delusions, obsessions, paranoid ideation, or ideas of reference   Perceptual disturbances:    Denies auditory and visual hallucinations, behavior not concerning for response to internal stimuli   Orientation:  Oriented to person, place, time, and general circumstances   Attention:  Able to fully attend without fluctuations in consciousness   Concentration:  Able to fully concentrate and attend   Memory:  Immediate, short-term, long-term, and recall grossly intact    Fund of knowledge:   Consistent with level of education and development   Insight:    Fair   Judgment:   Intact   Impulse Control:  Intact     DIAGNOSTIC IMPRESSION:   Post Traumatic Stress Disorder (PTSD)  Generalized Anxiety Disorder (GAD)  Panic  Disorder  Major Depressive Disorder, moderate, recurrent  Chronic pain syndrome  Fibromyalgia  Lupus    ASSESSMENT:   [name] is a very pleasant 65 y.o.  female from [place], [place] with multiple chronic pain complaints related to lupus, arthritis, and fibromyalgia. She was previously seen in our clinic from 2012-2014, and reestablished care with Dr. Fayrene Fearing in August 2016. The patient is struggling with depression and anxiety, but is very motivated to participate in intensive, multidisciplinary treatment to address the connection between depression, anxiety and pain. She has a long-standing history of depression and anxiety, and has worked with outpatient psychiatrist and therapist over the years. She is currently established with [facility] psychiat[facility], and is tapering off of Klonopin.  Depression, anxiety, and panic have all improved.  Has been diagnosed with diabetes, and is managing it well with p.o. medication and dietary changes.  Had lost 50+ pounds but regained 20 lbs recently.  Patient presents with low-grade depression related to COVID and some social isolation.       PLAN:   (1) Psychotherapy - Continue CBT. CBT will be used to address PTSD, MDD, Panic D/o, GAD, and chronic pain.     --Behavioral Activation - Encouraged behavioral activation.  Is doing great, walking 5 days/week  --Downloaded and uses Insight Timer, guided relaxation app, for nightly practice.  --Continues to utilize diaphragmatic breathing daily    (2) Psychiatry - Pt currently established with [facility].  - to follow up with her psychiatrist and discuss low mood despite reduced stress and increased behavioral activation. To ask about possibility of adjusting mood stabilizer (Lamictal) versus other possible changes to help improve mood. (in psychotherapy will cont beh activation and identified need for increased socialization - this has changed post covid related school changes, is alone much more these days with her granddaughter back in school)    (3) Safety - Pt denies any current or recent SI or safety concerns. Knows to call 911 or go to her local ED. Also previously given [facility].      (4) follow-up - with me in 2 months

## 2020-09-15 ENCOUNTER — Ambulatory Visit: Admit: 2020-09-15 | Discharge: 2020-09-16 | Payer: MEDICARE

## 2020-09-15 DIAGNOSIS — M329 Systemic lupus erythematosus, unspecified: Secondary | ICD-10-CM | POA: Diagnosis not present

## 2020-09-15 LAB — CBC W/ AUTO DIFF
BASOPHILS ABSOLUTE COUNT: 0.1 10*9/L (ref 0.0–0.1)
BASOPHILS RELATIVE PERCENT: 1 %
EOSINOPHILS ABSOLUTE COUNT: 0.2 10*9/L (ref 0.0–0.7)
EOSINOPHILS RELATIVE PERCENT: 4.6 %
HEMATOCRIT: 39.6 % (ref 35.0–44.0)
HEMOGLOBIN: 12.9 g/dL (ref 12.0–15.5)
LYMPHOCYTES ABSOLUTE COUNT: 2 10*9/L (ref 0.7–4.0)
LYMPHOCYTES RELATIVE PERCENT: 36.7 %
MEAN CORPUSCULAR HEMOGLOBIN CONC: 32.7 g/dL (ref 30.0–36.0)
MEAN CORPUSCULAR HEMOGLOBIN: 27.8 pg (ref 26.0–34.0)
MEAN CORPUSCULAR VOLUME: 85.1 fL (ref 82.0–98.0)
MEAN PLATELET VOLUME: 7.2 fL (ref 7.0–10.0)
MONOCYTES ABSOLUTE COUNT: 0.6 10*9/L (ref 0.1–1.0)
MONOCYTES RELATIVE PERCENT: 10.5 %
NEUTROPHILS ABSOLUTE COUNT: 2.5 10*9/L (ref 1.7–7.7)
NEUTROPHILS RELATIVE PERCENT: 47.2 %
NUCLEATED RED BLOOD CELLS: 0 /100{WBCs} (ref <=4)
PLATELET COUNT: 330 10*9/L (ref 150–450)
RED BLOOD CELL COUNT: 4.65 10*12/L (ref 3.90–5.03)
RED CELL DISTRIBUTION WIDTH: 14.1 % (ref 12.0–15.0)
WBC ADJUSTED: 5.4 10*9/L (ref 3.5–10.5)

## 2020-09-15 LAB — URINALYSIS WITH CULTURE REFLEX
BACTERIA: NONE SEEN /HPF
BILIRUBIN UA: NEGATIVE
BLOOD UA: NEGATIVE
GLUCOSE UA: NEGATIVE
KETONES UA: NEGATIVE
NITRITE UA: NEGATIVE
PH UA: 6.5 (ref 5.0–9.0)
PROTEIN UA: NEGATIVE
RBC UA: 3 /HPF (ref <4)
SPECIFIC GRAVITY UA: 1.02 (ref 1.005–1.040)
SQUAMOUS EPITHELIAL: 2 /HPF (ref 0–5)
UROBILINOGEN UA: 0.2
WBC UA: 4 /HPF (ref 0–5)

## 2020-09-15 LAB — C3 COMPLEMENT: C3 COMPLEMENT: 125 mg/dL (ref 90–170)

## 2020-09-15 LAB — CREATININE
CREATININE: 0.99 mg/dL — ABNORMAL HIGH
EGFR CKD-EPI AA FEMALE: 71 mL/min/{1.73_m2} (ref >=60)
EGFR CKD-EPI NON-AA FEMALE: 61 mL/min/{1.73_m2} (ref >=60)

## 2020-09-15 LAB — PROTEIN / CREATININE RATIO, URINE
CREATININE, URINE: 87 mg/dL
PROTEIN URINE: 13.1 mg/dL
PROTEIN/CREAT RATIO, URINE: 0.151

## 2020-09-15 LAB — BUN: BLOOD UREA NITROGEN: 13 mg/dL (ref 9–23)

## 2020-09-15 LAB — C4 COMPLEMENT: C4 COMPLEMENT: 21.2 mg/dL (ref 12.0–36.0)

## 2020-09-16 LAB — ANTI-DNA ANTIBODY, DOUBLE-STRANDED: DSDNA ANTIBODY: NEGATIVE

## 2020-09-22 ENCOUNTER — Encounter: Payer: Self-pay | Admitting: Family Medicine

## 2020-09-23 ENCOUNTER — Other Ambulatory Visit: Payer: Self-pay

## 2020-09-23 ENCOUNTER — Ambulatory Visit: Payer: Medicare Other

## 2020-09-23 ENCOUNTER — Encounter
Admit: 2020-09-23 | Discharge: 2020-09-24 | Payer: MEDICARE | Attending: Student in an Organized Health Care Education/Training Program | Primary: Student in an Organized Health Care Education/Training Program

## 2020-09-23 VITALS — BP 140/80

## 2020-09-23 DIAGNOSIS — Z0130 Encounter for examination of blood pressure without abnormal findings: Secondary | ICD-10-CM

## 2020-09-23 DIAGNOSIS — F329 Major depressive disorder, single episode, unspecified: Principal | ICD-10-CM

## 2020-09-23 DIAGNOSIS — R413 Other amnesia: Principal | ICD-10-CM

## 2020-09-23 DIAGNOSIS — F3342 Major depressive disorder, recurrent, in full remission: Principal | ICD-10-CM

## 2020-09-23 DIAGNOSIS — F411 Generalized anxiety disorder: Principal | ICD-10-CM

## 2020-09-23 NOTE — Unmapped (Signed)
[facility]  Psychiatry Telehealth Encounter  Established Patient - Video Visit    I spent 15 minutes on the real-time audio and video with the patient, and then 13 minutes on the phone after the video failed to continue. I spent an additional 10 minutes on pre- and post-visit activities.     The patient was physically located in [place] or a state in which I am permitted to provide care. The patient and/or parent/guardian understood that s/he may incur co-pays and cost sharing, and agreed to the telemedicine visit. The visit was reasonable and appropriate under the circumstances given the patient's presentation at the time.    The patient and/or parent/guardian has been advised of the potential risks and limitations of this mode of treatment (including, but not limited to, the absence of in-person examination) and has agreed to be treated using telemedicine. The patient's/patient's family's questions regarding telemedicine have been answered.     If the visit was completed in an ambulatory setting, the patient and/or parent/guardian has also been advised to contact their provider???s office for worsening conditions, and seek emergency medical treatment and/or call 911 if the patient deems either necessary.    Assessment:  QUINCEY NORED is a 65 y.o. female with a history of MDD, GAD, and Panic Disorder in the context of many chronic medical conditions including chronic pain/fibromyalgia, osteoarthritis with degenerative disc disease, Lupus, pituitary tumor (benign), s/p right total knee arthroplasty, and VIN II (s/p resection), who presents for follow-up evaluation. She has been maintained on Lamictal for many years as well as cymbalta, trazodone, klonopin, and wellbutrin. Patient has previously tolerated a slight taper in her klonopin from 0.75 mg to 0.25 mg qHS overtime. Her mood is often exacerbated by steroid use for current treatment of pulmonary disease. Patient is engaged with CBT therapy at the pain clinic.    Met Ms. Purohit today 09/23/20 for epic video-visit.  She reports betterment of mood on effexor, improved even from prior visit. She is currently taking it BID.  Taper of trazodone has been unsuccessful at this point; she continues on 150mg . Will follow-up in 2-3 months.    For Next Visit:  [ ]  Discuss mood, depression  [ ]  Discuss family relationships    Risk Assessment:  A full suicide and violence risk assessment was performed as part of this patient's initial evaluation with [facility] outpatient psychiatry.  There is no new acute risk for suicide or violence at this time. The patient was educated about relevant modifiable risk factors including following recommendations for treatment of psychiatric illness and abstaining from substance abuse.  While future psychiatric events cannot be accurately predicted, the patient does not currently require acute inpatient psychiatric care and does not currently meet [facility] involuntary commitment criteria.    Current Medication Regimen:  -- Lamictal 400mg  qhs  -- Effexor 37.5 mg BID  -- Wellbutrin XL 150mg  qAM   -- Clonazepam 0.25mg  qHS  -- Trazodone 150mg  at bedtime    Past medications:  Cymbalta    Plan:    # Depression - Anxiety - Panic Disorder  Status: Stable, improved  - Continue Lamictal 400mg  qhs for mood  --Continue effexor 37.5mg  BID  - Continue Wellbutrin XL 150mg  qAM    --Might consider this again as target for taper which would be relatively simple in future  - Continue Clonazepam 0.25mg  qHS, appropriate filling per NCCSRS  - Continue CBT at pain clinic; advised her to see if therapist willing to talk  some about childhood, which is a stressor for patient   - Family excursions/engagement are really helpful to Ms. Flenner who values family    # Insomnia: Last sleep study performed in 2017. Per chart review, showed mild OSA likely related to medication use. Neurology recommends patient avoid sleeping on back as she likely has OSA when sleeping in this position  Status: Stable, improved on current medication regimen   --Continue melatonin 3-5mg  at bedtime   -- Continue trazodone 150mg  qHS for insomnia (d5/4/21, d 02/05/20, i9/8)  - Clonazepam per above    # Mild Neurocognitive Impairment: Patient seen by Neurology ([facility]) 05/22/18 who dx pt with mild neurocognitive impairment w/ deficits in inattention and visual-spatial function with suspicion that sleep apnea and polypharmacy may be contributing to her difficulties. Recommended that psychopharm regimen be simplified, including eliminating trazodone and considering changing Wellbutrin to Zoloft or Lexapro for anxiety and eliminating Klonopin when stable.     Status: Stable. No new concerns reported by patient although we have been limited by covid as to [facility] or other neurocog testing,, we have not been able to decrease clonazepam, wellbutrin or trazodone. Pandemic and social atmosphere make changes in pharm more challenging at this time  - Polypharmacy a concern  - However, it is very likely that patient's lupus is significantly contributing to cognitive decline, which will limit improvement she will see with reduction in polypharmacy  --Last MOCA 22 on 12/12/2017    # Medical Monitoring - High Risk Medication Use  Status: Stable   - The complexity of this patient's care involves drug therapy which requires intensive monitoring for toxicity. This is in part due to a narrow therapeutic window, as well as potential for toxicity. This patient is being treated with Lamotrigine (Lamictal) to target their psychiatric disorder, Major depressive disorder, refractory. In regards to this medication being considered high risk, Lamotrigine (lamictal) has black box warning for serious rash including fatal reaction of Stevens-Johnson syndrome and rare cases of toxic epidermal necrolysis. In addition to prolonged titration to mitigate risk of serious rash, lamotrigine requires monitoring of hepatic and renal function at baseline, and at times will require monitoring of lamotrigine blood levels.    Lamotrigine Monitoring  - Hepatic function testing, platelets (via CBC) q6 months  -AST/ALT WNL     Effexor   --Impaired renal function can decrease clearance of effexor  --Labs stable as of 1/11  --Will continue to monitor along with PCP given dx lupus    Lab Results   Component Value Date    WBC 5.4 09/15/2020    HGB 12.9 09/15/2020    HCT 39.6 09/15/2020    PLT 330 09/15/2020       Lab Results   Component Value Date    NA 141 11/09/2019    K 4.3 11/09/2019    CL 100 11/09/2019    CO2 29.0 11/09/2019    BUN 13 09/15/2020    CREATININE 0.99 (H) 09/15/2020    GLU 74 11/09/2019    CALCIUM 9.8 11/09/2019    MG 2.1 12/31/2013    PHOS 4.9 (H) 12/31/2013       Lab Results   Component Value Date    BILITOT 0.7 11/09/2019    BILIDIR <0.10 05/01/2018    PROT 7.3 04/02/2020    ALBUMIN 3.6 04/02/2020    ALT 23 04/02/2020    AST 24 04/02/2020    ALKPHOS 98 11/09/2019    GGT 19 08/26/2012  Lab Results   Component Value Date    PT 12.0 09/17/2012    INR 1.11 01/03/2014    APTT 30.8 01/03/2014     Creatinine Whole Blood, POC   Date/Time Value Ref Range Status   12/25/2014 09:35 AM 1.0 0.7 - 1.1 mg/dL Final   16/06/9603 54:09 AM 1.0 0.7 - 1.1 MG/DL Final     Comment:     Performed by:  [facility]  [address] [place], [place] [address]     Creatinine/CP   Date/Time Value Ref Range Status   08/07/2012 12:52 PM 0.92 0.60 - 1.00 MG/DL Final     Creatinine   Date/Time Value Ref Range Status   09/15/2020 09:05 AM 0.99 (H) 0.60 - 0.80 mg/dL Final   47/82/9562 13:08 AM 1.06 (H) 0.57 - 1.00 mg/dL Final     # Return to Clinic: 2-3 months    Psychotherapy:  No billable psychotherapy service provided but brief supportive therapy was utilized.    Patient has been given this writer's contact information as well as the [facility] Psychiatry urgent line number. The patient has been instructed to call 911 for emergencies.    Patient and plan of care were discussed with the Attending MD,Dr. Cecilio Asper, who agrees with the above statement and plan.     Connye Burkitt MD    Subjective:     Psychiatric Chief Concern:  Follow-up psychiatric evaluation for depression, anxiety and insomnia.     Interval History: Met with Ms. Coster virtually today. She says that it's been tough to stay inside with all the snow. She had a nice Christmas holiday with her family and they all stayed over. She was able to enjoy that.   The parking lot just got cleared at her apartment complex. She notes that nothing has been bothering her too badly. She sometimes gets a down feeling but it doesn't last too long. She takes effeexor one in the morning and one at night. She's still taking the 150 of wellbutrin in the morning. She's still taking the trazodone 150mg  and melatonin for sleep. She says her sleep has been doing good. She expresses no other concerns and states that she has been more assertive with her family regarding her needs. We end the appointment as she is able to get out to her car and go where she needs to! We discuss following up in 2-3 months.     Social History: reviewed; patient recently moved apartments within the same complex    ROS:  As per Interval History and:  Constitutional:  no significant appetite change. Anxiety, dysthymia bettered   Neuro:  none / negative    Objective:    Mental Status Exam:   Appearance  Well-appeaing, INAD       Speech/Language:    Normal rate, volume, tone, fluency and Language intact, well formed   Affect  Mildly anxious, mood congruent   Mood:   good   Thought process and Associations:   Logical, linear, clear, coherent, goal directed   Abnormal/psychotic thought content:     Denies SI, HI, self harm, delusions, obsessions, paranoid ideation, or ideas of reference   Perceptual disturbances:     Does not endorse auditory or visual hallucinations     Orientation:   Oriented to person, place, time, and general circumstances   Insight:     Intact   Judgment:    Intact   Impulse Control:   Intact  Physical Exam: UTA phone visit    Medications: reviewed at today's visit    Psychometrics:  Psych Scale Scores - Adult      Office Visit from 09/04/2018 in [facility]RY Transplant AT [facility]   **PHQ-9: Severity Measure for DEPRESSION Total Score** 20 Collected on 09/04/2018 0000   WHODAS 2.0 (Self-administered) - Total Score 42 Collected on 09/04/2018 0000          Posey Rea, MD.

## 2020-09-24 ENCOUNTER — Encounter: Admit: 2020-09-24 | Discharge: 2020-09-25 | Payer: MEDICARE

## 2020-09-24 DIAGNOSIS — M8949 Other hypertrophic osteoarthropathy, multiple sites: Secondary | ICD-10-CM | POA: Diagnosis not present

## 2020-09-24 DIAGNOSIS — M25512 Pain in left shoulder: Secondary | ICD-10-CM | POA: Diagnosis not present

## 2020-09-24 DIAGNOSIS — M25511 Pain in right shoulder: Secondary | ICD-10-CM | POA: Diagnosis not present

## 2020-09-24 DIAGNOSIS — G894 Chronic pain syndrome: Secondary | ICD-10-CM | POA: Diagnosis not present

## 2020-09-24 DIAGNOSIS — M542 Cervicalgia: Secondary | ICD-10-CM | POA: Diagnosis not present

## 2020-09-24 DIAGNOSIS — M5136 Other intervertebral disc degeneration, lumbar region: Secondary | ICD-10-CM | POA: Diagnosis not present

## 2020-09-24 DIAGNOSIS — M797 Fibromyalgia: Secondary | ICD-10-CM | POA: Diagnosis not present

## 2020-09-24 DIAGNOSIS — M47816 Spondylosis without myelopathy or radiculopathy, lumbar region: Secondary | ICD-10-CM | POA: Diagnosis not present

## 2020-09-24 DIAGNOSIS — M48061 Spinal stenosis, lumbar region without neurogenic claudication: Secondary | ICD-10-CM | POA: Diagnosis not present

## 2020-09-24 DIAGNOSIS — G6289 Other specified polyneuropathies: Secondary | ICD-10-CM | POA: Diagnosis not present

## 2020-09-24 DIAGNOSIS — Z7952 Long term (current) use of systemic steroids: Secondary | ICD-10-CM | POA: Diagnosis not present

## 2020-09-24 DIAGNOSIS — G8929 Other chronic pain: Secondary | ICD-10-CM | POA: Diagnosis not present

## 2020-09-24 DIAGNOSIS — M545 Chronic midline low back pain: Principal | ICD-10-CM

## 2020-09-24 MED ORDER — GABAPENTIN 300 MG CAPSULE
ORAL_CAPSULE | Freq: Three times a day (TID) | ORAL | 2 refills | 60.00000 days | Status: CP
Start: 2020-09-24 — End: 2020-11-23

## 2020-09-24 MED ORDER — CYCLOBENZAPRINE 5 MG TABLET
ORAL_TABLET | Freq: Two times a day (BID) | ORAL | 2 refills | 30.00000 days | Status: CP | PRN
Start: 2020-09-24 — End: 2020-10-20

## 2020-09-24 NOTE — Unmapped (Signed)
Thank you for speaking with [place] today! We discussed getting x-rays of both of your shoulders for your shoulder pain. Someone will call you to schedule this. We also talked about increasing your gabapentin dose to 600 mg three times a day. This will help with the neuropathy in your feet. The instructions will be on the prescription. We have also refilled your Flexeril. At your next visit, we will discuss specialized socks to help with the neuropathy.

## 2020-09-24 NOTE — Unmapped (Signed)
Chronic Pain Follow Up Note      I spent 9 minutes on the real-time audio and video with the patient. I spent an additional 11 minutes on pre- and post-visit activities.   The patient consented to this consult.    The patient was physically located in [place] or a state in which I am permitted to provide care. The patient understood that s/he may incur co-pays and cost sharing, and agreed to the telemedicine visit. The visit was completed via phone and/or video, which was appropriate and reasonable under the circumstances given the patient's presentation at the time.    The patient has been advised of the potential risks and limitations of this mode of treatment (including, but not limited to, the absence of in-person examination) and has agreed to be treated using telemedicine. The patient's/patient's family's questions regarding telemedicine have been answered. No vitals or physical exam was performed but the previous exam was copied forward in this note for continuity.     If the phone/video visit was completed in an ambulatory setting, the patient has also been advised to contact their provider???s office for worsening conditions, and seek emergency medical treatment and/or call 911 if the patient deems either necessary.    Visit modifiers:   POS 02 and 95 (virtual visit with video)    -Location of patient during visit: [place]  -Provider location: [place]  -Names of all people present during visit: patient, Dr. Fayrene Fearing, Dr. Harrison Mons, Margaretmary Lombard Al-Kadhi (scribe)    Assessment and Plan    Caitlin Smith is a 65 y.o.  being followed at [facility] Management clinic for complaint of chronic pain localized to right knee and bilateral neck, shoulders and upper back myofascial pain and fibromyalgia, and lower back 2/2 lumbar facet arthropathy and DDD and moderate degree of lumbar central spinal canal stenosis. Patient has a history of chronic pain related to lupus and arthritis and is on rheumatological therapy.     Bilateral shoulder pain  This is her primary pain complaint today. Bilateral shoulder pain is worst on the right side. Shoulder pain is present all the time and is worst with abduction. Will order bilateral shoulder imaging for further evaluation.  - Order placed for bilateral shoulder XR    Polyneuropathy  Patient notes that her peripheral neuropathy has been more bothersome as of late. Will increase gabapentin as below in hopes of providing improved analgesia.  - Increase gabapentin by one tablet weekly to goal of 600 mg TID   - Plan to order compression garments at next visit    Chronic pain syndrome; Fibromyalgia  Patient reports improved pain with transitioning from Lyrica to gabapentin at last visit. She is also taking flexeril and Voltaren gel. She denies adverse side effects on her regimen. Will increase gabapentin from 300/300/600 mg to 600 mg TID in hopes of providing improved analgesia.  - Increase gabapentin to 600 mg TID, refilled  - Continue Flexeril 5 mg BID PRN, refilled  - Continue Cymbalta 120mg  am per PCP  - Continue Trazodone 150 mg qhs per PCP  - Continue voltaren Gel  - Continue pain psychology.  - Continue activity as able  - Follow up on 2/15 with Al Corpus, CPP.    Chronic lower back pain: Stable. Secondary to lumbar facet arthropathy and DDD and moderate degree of lumbar central spinal canal stenosis.   -Continue Cymbalta, Flexeril as above.    Future considerations:  Genicular nerve block/RFA  Lumbar MBBs  Requested Prescriptions     Signed Prescriptions Disp Refills   ??? gabapentin (NEURONTIN) 300 MG capsule 360 capsule 2     Sig: Take 2 capsules (600 mg total) by mouth Three (3) times a day.   ??? cyclobenzaprine (FLEXERIL) 5 MG tablet 60 tablet 2     Sig: Take 1 tablet (5 mg total) by mouth two (2) times a day as needed.     Orders Placed This Encounter   Procedures   ??? XR Shoulder 2 Views Bilateral     Standing Status:   Future     Standing Expiration Date:   09/24/2021     Order Specific Question:   Performed at     Answer:   [facility]     Order Specific Question:   Reason for Exam:     Answer:   R>L shoulder pain, pain with abduction     Risks and benefits of above medications including but not limited to possibility of respiratory depression, sedation, and even death were discussed with the patient who expressed an understanding.     Urine toxicology screen is not due today.  Treatment agreement renewal is not due today.     HPI  Caitlin Smith is a 65 y.o. being followed at [facility] clinic for complaint of chronic pain localized to right knee and bilateral neck, shoulder, upper back, and lower back 2/2 lumbar facet arthropathy and DDD. Patient has a history of chronic pain related to lupus, arthritis, and fibromyalgia.     At last visit in November with our NP, patient reported that she discontinued Lyrica due to minimal pain relief. We discussed upward titration of Lyrica although patient wished to restart gabapentin with an increased nighttime dose; we therefore restarted gabapentin at 300/300/600 mg. She has continued following Pain Psychology in the interim.     Today, patient presents with primary complaint of bilateral shoulder (R>L) and bilateral foot pain pain. She describes shoulder pain as shooting. She reports shoulder pain with abduction. She is not currently being followed by Orthopedics for her shoulder pain. She describes her bilateral foot pain as neuropathy. She continues on gabapentin, Cymbalta, trazodone, flexeril, and Voltaren gel PRN. She denies adverse side effects on her regimen. She inquires about increasing gabapentin. She also endorses ongoing chronic back pain as documented in the questionnaire below.    The patient's medication regimen:  Gabapentin 300/300/600 mg  Cymbalta 120mg  am (PCP)  Trazodone 150 mg qhs (PCP)  Flexeril 5 mg BID prn  Voltaren gel prn     Current view: Showing all answers Show Only Relevant Answers    Legend:         Triggered a BPA Scoring question                     [place] Patient-Entered Hpi Selection Questionnaire     Question 09/24/2020  8:18 AM EST - Filed by Patient    What is the primary reason for your visit? back pain    Your back pain is...    constant    When did you first notice your back pain?   Several years    How often do you feel back pain?   constant    Since you first noticed your back pain, how has it changed?   Mildly worse    Where is your back pain located? Lower back with radiation to her upper gluteal region      How would  you describe your back pain?   Throbbing, pulsing, burning    Where does your back pain spread?   Upper region of gluteal region and bilateral hips    On a scale of 0 to 10 (10 being the worst), how strong is your back pain? 5/10      Your back pain is... Present all the time      What makes your back pain worse? Movement, activity      When do you feel stiffness in your back?   Intermittently, She feels it when she gets up and has to stretch for a while before stiffness resolves    Do any of the following apply to you? N/A          Patient denies homicidal/suicidal ideation.     Medication Monitoring  NCCSRS database was reviewed and it was appropriate.  Last urine toxicology screen: N/A, not on opioids  The patient did not bring pill bottles today, not on opioids    Allergies  Allergies   Allergen Reactions   ??? Vilazodone Swelling   ??? Ace Inhibitors Other (See Comments)     Pt states she can not take ace inhibitors. Pt states she can not remember her reaction.    ??? Bee Pollen Itching     COUGH, WATERY EYES   ??? Penicillin G Rash   ??? Penicillins Rash   ??? Pollen Extracts Itching     COUGHING, WATERY EYES       Home Medications    Current Outpatient Medications   Medication Sig Dispense Refill   ??? amLODIPine (NORVASC) 5 MG tablet Take 1 tablet (5 mg total) by mouth daily. 90 tablet 3   ??? aspirin (ECOTRIN) 81 MG tablet Take 81 mg by mouth daily.     ??? atorvastatin (LIPITOR) 40 MG tablet Take 40 mg by mouth daily.     ??? biotin 1 mg cap Take by mouth.     ??? buPROPion (WELLBUTRIN XL) 150 MG 24 hr tablet Take 1 tablet (150 mg total) by mouth every morning. 90 tablet 2   ??? calcipotriene (DOVONOX) 0.005 % ointment Apply every other day to areas of rash in folds as maintenance 60 g 2   ??? clobetasoL (TEMOVATE) 0.05 % ointment Apply BID to all affected area 60 g 5   ??? clobetasoL (TEMOVATE) 0.05 % ointment Apply topically nightly. 45 g 3   ??? clonazePAM (KLONOPIN) 0.5 MG tablet Take 0.5 tablets (0.25 mg total) by mouth nightly. 15 tablet 2   ??? clotrimazole-betamethasone (LOTRISONE) 1-0.05 % cream Apply 1 application topically Two (2) times a day.     ??? cyclobenzaprine (FLEXERIL) 5 MG tablet Take 1 tablet (5 mg total) by mouth two (2) times a day as needed. 60 tablet 2   ??? diclofenac sodium (VOLTAREN) 1 % gel Apply 2 g topically two (2) times a day as needed for arthritis. 300 g 5   ??? dicyclomine (BENTYL) 20 mg tablet Take 20 mg by mouth every six (6) hours.     ??? FLOVENT HFA 110 mcg/actuation inhaler USE 2 PUFFS BY MOUTH TWO  TIMES DAILY 36 g 3   ??? fluticasone propionate (FLONASE) 50 mcg/actuation nasal spray      ??? gabapentin (NEURONTIN) 300 MG capsule Take 2 capsules (600 mg total) by mouth Three (3) times a day. 360 capsule 2   ??? halobetasol (ULTRAVATE) 0.05 % ointment Apply topically Two (2) times a day. 50 g 1   ???  hydrOXYchloroQUINE (PLAQUENIL) 200 mg tablet Take 1 tablet (200 mg total) by mouth Two (2) times a day. 180 tablet 3   ??? inhalational spacing device (AEROCHAMBER MV) Spcr 1 each by Miscellaneous route two (2) times a day. With Fovent 1 each 0   ??? ketoconazole (NIZORAL) 2 % cream APP EXT AA D  0   ??? lamoTRIgine (LAMICTAL) 200 MG tablet Take 1 tablet (200 mg total) by mouth Two (2) times a day. 180 tablet 2   ??? meclizine (ANTIVERT) 25 mg tablet Take 25 mg by mouth Three (3) times a day as needed.      ??? melatonin 10 mg Tab Take 1 tablet by mouth.     ??? metroNIDAZOLE (METROGEL) 0.75 % gel Apply one applicatorful (~37.5 mg metronidazole) intravaginally once daily for 5 days. 45 g 0   ??? mupirocin (BACTROBAN) 2 % ointment APP EXT IEN BID  0   ??? mycophenolate (MYFORTIC) 360 MG TbEC Take 1 tablet by mouth twice daily 180 tablet 3   ??? olopatadine (PATANOL) 0.1 % ophthalmic solution Apply to eye.     ??? omeprazole (PRILOSEC) 40 MG capsule Take 40 mg by mouth daily.      ??? ondansetron (ZOFRAN) 4 MG tablet      ??? polyethylene glycol (GLYCOLAX) 17 gram/dose powder 2 cap fulls in a full glass of water, three times a day, for 5 days.     ??? sucralfate (CARAFATE) 1 gram tablet      ??? telmisartan (MICARDIS) 80 MG tablet Take 80 mg by mouth.     ??? traZODone (DESYREL) 100 MG tablet Take 1.5 tablets (150 mg total) by mouth nightly for 90 doses. 135 tablet 0   ??? triamcinolone (KENALOG) 0.1 % ointment Apply to rash when itchy as needed, few times per week 454 g 0   ??? triamcinolone (KENALOG) 0.1 % ointment Apply twice a day to affected areas when rough, otherwise just on the weekends 454 g 1   ??? venlafaxine (EFFEXOR-XR) 37.5 MG 24 hr capsule Take 2 capsules (75 mg total) by mouth daily. 60 capsule 3     No current facility-administered medications for this visit.     ROS  8 systems reviewed and negative except as mentioned in HPI    Physical Exam    VITALS:   There were no vitals filed for this visit.  Wt Readings from Last 3 Encounters:   09/03/20 (!) 103 kg (227 lb)   07/06/20 (!) 103.1 kg (227 lb 3.2 oz)   06/22/20 (!) (P) 103.6 kg (228 lb 4.8 oz)     GENERAL:  The patient appears to be in no distress. The patient is pleasant and interactive. Patient is a good historian.  No evidence of sedation.  HEENT:    Atraumatic.    RESPIRATORY:   Normal work of breathing.  No supplemental O2.  GASTROINTESTINAL:   Deferred.  NEUROLOGIC:    The patient was alert and oriented, speech fluent, normal language.   MUSCULOSKELETAL:    Deferred.  SKIN:   Deferred.  PSYCHIATRIC:  Appropriate mood and affect.  No pressured speech.    Documentation assistance was provided by Judd Lien, Scribe, on September 24, 2020 at 8:59 AM for Dr. Leata Mouse, MD.

## 2020-09-28 NOTE — Unmapped (Signed)
This was a telehealth service where a resident was involved. As the attending physician, I spent 9 minutes in medical discussion with the patient via real-time audio and video, participating in the key portions of the service.I spent an additional 11 minutes on pre- and post-visit activities which were specific to the patient and included reviewing the patient???s medical records, lab results, imaging results, and other pertinent records. I reviewed the resident's note and I agree with the resident's findings and plan.

## 2020-10-05 MED ORDER — TRAZODONE 100 MG TABLET
ORAL_TABLET | Freq: Every evening | ORAL | 0 refills | 90 days | Status: CP
Start: 2020-10-05 — End: 2021-01-03

## 2020-10-05 NOTE — Unmapped (Signed)
Medication name and strength: traZODone (DESYREL) 100 MG tablet     Correct directions: Take 1.5 tablets (150 mg total) by mouth nightly for 90 doses    Last provider and date patient seen: 09/23/2020 with Dayton Scrape    Upcoming appointment with what provider: None    Pharmacy: [id number] [place], [place] - [address] AT [facility] OF [address]   [address] [place], [place] [address]   Phone:  [phone] ??Fax:  [phone]

## 2020-10-06 ENCOUNTER — Encounter: Payer: Self-pay | Admitting: Family Medicine

## 2020-10-06 ENCOUNTER — Other Ambulatory Visit: Payer: Self-pay | Admitting: Family Medicine

## 2020-10-06 MED ORDER — ATENOLOL 25 MG TABLET
Freq: Every day | ORAL | 0 days
Start: 2020-10-06 — End: ?

## 2020-10-06 MED ORDER — ATENOLOL 25 MG PO TABS: 25.0000 mg | ORAL_TABLET | Freq: Every evening | ORAL | 0 refills | Status: DC

## 2020-10-10 MED ORDER — LAMOTRIGINE 200 MG TABLET
ORAL_TABLET | Freq: Two times a day (BID) | ORAL | 1 refills | 90 days | Status: CP
Start: 2020-10-10 — End: 2021-01-08

## 2020-10-10 MED ORDER — CLONAZEPAM 0.5 MG TABLET
ORAL_TABLET | Freq: Every evening | ORAL | 2 refills | 30 days | Status: CP
Start: 2020-10-10 — End: ?

## 2020-10-10 MED ORDER — BUPROPION HCL XL 150 MG 24 HR TABLET, EXTENDED RELEASE
ORAL_TABLET | Freq: Every morning | ORAL | 2 refills | 90 days | Status: CP
Start: 2020-10-10 — End: ?

## 2020-10-10 MED ORDER — VENLAFAXINE ER 37.5 MG CAPSULE,EXTENDED RELEASE 24 HR
ORAL_CAPSULE | Freq: Every day | ORAL | 1 refills | 90.00000 days | Status: CP
Start: 2020-10-10 — End: ?

## 2020-10-12 ENCOUNTER — Encounter: Admit: 2020-10-12 | Discharge: 2020-10-13 | Payer: MEDICARE

## 2020-10-12 DIAGNOSIS — M19012 Primary osteoarthritis, left shoulder: Secondary | ICD-10-CM | POA: Diagnosis not present

## 2020-10-12 DIAGNOSIS — M19011 Primary osteoarthritis, right shoulder: Secondary | ICD-10-CM | POA: Diagnosis not present

## 2020-10-12 DIAGNOSIS — G8929 Other chronic pain: Secondary | ICD-10-CM | POA: Diagnosis not present

## 2020-10-12 DIAGNOSIS — M25512 Pain in left shoulder: Principal | ICD-10-CM

## 2020-10-12 DIAGNOSIS — M25511 Pain in right shoulder: Principal | ICD-10-CM
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
## 2020-10-20 ENCOUNTER — Ambulatory Visit: Admit: 2020-10-20 | Discharge: 2020-10-21 | Payer: MEDICARE

## 2020-10-20 DIAGNOSIS — Z7952 Long term (current) use of systemic steroids: Secondary | ICD-10-CM | POA: Diagnosis not present

## 2020-10-20 DIAGNOSIS — M48061 Spinal stenosis, lumbar region without neurogenic claudication: Secondary | ICD-10-CM | POA: Diagnosis not present

## 2020-10-20 DIAGNOSIS — M8949 Other hypertrophic osteoarthropathy, multiple sites: Secondary | ICD-10-CM | POA: Diagnosis not present

## 2020-10-20 DIAGNOSIS — M545 Chronic midline low back pain: Principal | ICD-10-CM

## 2020-10-20 DIAGNOSIS — G6289 Other specified polyneuropathies: Secondary | ICD-10-CM | POA: Diagnosis not present

## 2020-10-20 DIAGNOSIS — M542 Cervicalgia: Secondary | ICD-10-CM | POA: Diagnosis not present

## 2020-10-20 DIAGNOSIS — G8929 Other chronic pain: Secondary | ICD-10-CM | POA: Diagnosis not present

## 2020-10-20 DIAGNOSIS — M797 Fibromyalgia: Secondary | ICD-10-CM | POA: Diagnosis not present

## 2020-10-20 DIAGNOSIS — G894 Chronic pain syndrome: Secondary | ICD-10-CM | POA: Diagnosis not present

## 2020-10-20 MED ORDER — GABAPENTIN 300 MG CAPSULE
ORAL_CAPSULE | Freq: Three times a day (TID) | ORAL | 2 refills | 60 days | Status: CP
Start: 2020-10-20 — End: 2020-12-19

## 2020-10-20 MED ORDER — CYCLOBENZAPRINE 5 MG TABLET
ORAL_TABLET | Freq: Two times a day (BID) | ORAL | 2 refills | 30.00000 days | Status: CP | PRN
Start: 2020-10-20 — End: ?

## 2020-10-20 MED ORDER — DICLOFENAC 1 % TOPICAL GEL
Freq: Two times a day (BID) | TOPICAL | 5 refills | 75 days | Status: CP | PRN
Start: 2020-10-20 — End: ?

## 2020-10-20 NOTE — Unmapped (Addendum)
It was nice to see you.  Today we did the following:    1. Continue medications as previously prescribed.  Gabapentin is ordered 600 mg three times a day.    2. I will reach out to Dr. Fayrene Fearing about an injection in your back.    3. We will order compression socks for neuropathy.    4. Follow up in 3 months       Thank you for choosing [facility]. It was a pleasure to see you in clinic today. Please contact us with any questions or concerns at [phone].       Thank you,   Al Corpus, PharmD, CPP

## 2020-10-21 DIAGNOSIS — M5416 Radiculopathy, lumbar region: Principal | ICD-10-CM

## 2020-10-21 NOTE — Unmapped (Signed)
Patient c/o right leg radicular sx, recent lumbar xray suggestive of DDD, prior lumbar MRI 2017 with evidence of degenerative facet changes leading to foraminal stenosis moderate degree right L5/S1, anticipate disease progression, will obtain new lumbar spine MRI.

## 2020-10-21 NOTE — Unmapped (Signed)
I was the supervising physician in the delivery of the service.

## 2020-10-28 ENCOUNTER — Ambulatory Visit: Admit: 2020-10-28 | Discharge: 2020-10-29 | Payer: MEDICARE

## 2020-10-28 DIAGNOSIS — M4726 Other spondylosis with radiculopathy, lumbar region: Secondary | ICD-10-CM | POA: Diagnosis not present

## 2020-10-28 DIAGNOSIS — M4727 Other spondylosis with radiculopathy, lumbosacral region: Secondary | ICD-10-CM | POA: Diagnosis not present

## 2020-10-28 DIAGNOSIS — M5117 Intervertebral disc disorders with radiculopathy, lumbosacral region: Secondary | ICD-10-CM | POA: Diagnosis not present

## 2020-10-28 DIAGNOSIS — M4316 Spondylolisthesis, lumbar region: Secondary | ICD-10-CM | POA: Diagnosis not present

## 2020-10-29 NOTE — Unmapped (Signed)
[facility] Clinical Assessment & Refill Coordination Note    Caitlin Smith, DOB: 1956/05/14  Phone: [phone] (home)     All above HIPAA information was verified with patient.     Was a Nurse, learning disability used for this call? No    Specialty Medication(s):   Inflammatory Disorders: mycophenolate     Current Outpatient Medications   Medication Sig Dispense Refill   ??? amLODIPine (NORVASC) 5 MG tablet Take 1 tablet (5 mg total) by mouth daily. 90 tablet 3   ??? aspirin (ECOTRIN) 81 MG tablet Take 81 mg by mouth daily.     ??? atenoloL (TENORMIN) 25 MG tablet Take 25 mg by mouth daily.     ??? atorvastatin (LIPITOR) 40 MG tablet Take 40 mg by mouth daily.     ??? biotin 1 mg cap Take by mouth.     ??? buPROPion (WELLBUTRIN XL) 150 MG 24 hr tablet Take 1 tablet (150 mg total) by mouth every morning. 90 tablet 2   ??? calcipotriene (DOVONOX) 0.005 % ointment Apply every other day to areas of rash in folds as maintenance 60 g 2   ??? clobetasoL (TEMOVATE) 0.05 % ointment Apply BID to all affected area 60 g 5   ??? clobetasoL (TEMOVATE) 0.05 % ointment Apply topically nightly. 45 g 3   ??? clonazePAM (KLONOPIN) 0.5 MG tablet Take 0.5 tablets (0.25 mg total) by mouth nightly. 15 tablet 2   ??? clotrimazole-betamethasone (LOTRISONE) 1-0.05 % cream Apply 1 application topically Two (2) times a day.     ??? cyclobenzaprine (FLEXERIL) 5 MG tablet Take 1 tablet (5 mg total) by mouth two (2) times a day as needed. 60 tablet 2   ??? diclofenac sodium (VOLTAREN) 1 % gel Apply 2 g topically two (2) times a day as needed for arthritis. 300 g 5   ??? dicyclomine (BENTYL) 20 mg tablet Take 20 mg by mouth every six (6) hours.     ??? FLOVENT HFA 110 mcg/actuation inhaler USE 2 PUFFS BY MOUTH TWO  TIMES DAILY 36 g 3   ??? fluticasone propionate (FLONASE) 50 mcg/actuation nasal spray      ??? gabapentin (NEURONTIN) 300 MG capsule Take 2 capsules (600 mg total) by mouth Three (3) times a day. 360 capsule 2   ??? halobetasol (ULTRAVATE) 0.05 % ointment Apply topically Two (2) times a day. 50 g 1   ??? hydrOXYchloroQUINE (PLAQUENIL) 200 mg tablet Take 1 tablet (200 mg total) by mouth Two (2) times a day. 180 tablet 3   ??? inhalational spacing device (AEROCHAMBER MV) Spcr 1 each by Miscellaneous route two (2) times a day. With Fovent 1 each 0   ??? ketoconazole (NIZORAL) 2 % cream APP EXT AA D  0   ??? lamoTRIgine (LAMICTAL) 200 MG tablet Take 1 tablet (200 mg total) by mouth Two (2) times a day. 180 tablet 1   ??? meclizine (ANTIVERT) 25 mg tablet Take 25 mg by mouth Three (3) times a day as needed.      ??? melatonin 10 mg Tab Take 1 tablet by mouth.     ??? metroNIDAZOLE (METROGEL) 0.75 % gel Apply one applicatorful (~37.5 mg metronidazole) intravaginally once daily for 5 days. 45 g 0   ??? mupirocin (BACTROBAN) 2 % ointment APP EXT IEN BID  0   ??? mycophenolate (MYFORTIC) 360 MG TbEC Take 1 tablet by mouth twice daily 180 tablet 3   ??? olopatadine (PATANOL) 0.1 % ophthalmic solution  Apply to eye.     ??? omeprazole (PRILOSEC) 40 MG capsule Take 40 mg by mouth daily.      ??? ondansetron (ZOFRAN) 4 MG tablet      ??? polyethylene glycol (GLYCOLAX) 17 gram/dose powder 2 cap fulls in a full glass of water, three times a day, for 5 days.     ??? telmisartan (MICARDIS) 80 MG tablet Take 80 mg by mouth.     ??? traZODone (DESYREL) 100 MG tablet Take 1.5 tablets (150 mg total) by mouth nightly for 90 doses. 135 tablet 0   ??? triamcinolone (KENALOG) 0.1 % ointment Apply to rash when itchy as needed, few times per week 454 g 0   ??? triamcinolone (KENALOG) 0.1 % ointment Apply twice a day to affected areas when rough, otherwise just on the weekends 454 g 1   ??? venlafaxine (EFFEXOR-XR) 37.5 MG 24 hr capsule Take 2 capsules (75 mg total) by mouth daily. 180 capsule 1     No current facility-administered medications for this visit.        Changes to medications: [name] reports no changes at this time.    Allergies   Allergen Reactions   ??? Vilazodone Swelling   ??? Ace Inhibitors Other (See Comments)     Pt states she can not take ace inhibitors. Pt states she can not remember her reaction.    ??? Bee Pollen Itching     COUGH, WATERY EYES   ??? Penicillin G Rash   ??? Penicillins Rash   ??? Pollen Extracts Itching     COUGHING, WATERY EYES       Changes to allergies: No    SPECIALTY MEDICATION ADHERENCE     mycophenolate 360mg : ~10 days of medicine on hand     Medication Adherence    Patient reported X missed doses in the last month: 0  Specialty Medication: mycophenolate 360mg           Specialty medication(s) dose(s) confirmed: Regimen is correct and unchanged.     Are there any concerns with adherence? No    Adherence counseling provided? Not needed    CLINICAL MANAGEMENT AND INTERVENTION      Clinical Benefit Assessment:    Do you feel the medicine is effective or helping your condition? Yes    Clinical Benefit counseling provided? Not needed    Adverse Effects Assessment:    Are you experiencing any side effects? No    Are you experiencing difficulty administering your medicine? No    Quality of Life Assessment:    Rheumatology:   Quality of Life    On a scale of 1 ??? 10 with 1 representing not at all and 10 representing completely ??? how has your rheumatologic condition affected your:  Daily pain level?: decline to answer  Ability to complete your regular daily tasks (prepare meals, get dressed, etc.)?: decline to answer  Ability to participate in social or family activities?: decline to answer         Have you discussed this with your provider? Not needed    Therapy Appropriateness:    Is therapy appropriate? Yes, therapy is appropriate and should be continued    DISEASE/MEDICATION-SPECIFIC INFORMATION      N/A    PATIENT SPECIFIC NEEDS     - Does the patient have any physical, cognitive, or cultural barriers? No    - Is the patient high risk? No    - Does the patient require a Care Management Plan? No     -  Does the patient require physician intervention or other additional services (i.e. nutrition, smoking cessation, social work)? No      SHIPPING     Specialty Medication(s) to be Shipped:   Inflammatory Disorders: mycophenolate 360mg     Other medication(s) to be shipped: No additional medications requested for fill at this time     Changes to insurance: No    Delivery Scheduled: Yes, Expected medication delivery date: 11/05/2020.     Medication will be delivered via Next Day Courier to the confirmed prescription address in [facility].    The patient will receive a drug information handout for each medication shipped and additional FDA Medication Guides as required.  Verified that patient has previously received a Conservation officer, historic buildings.    All of the patient's questions and concerns have been addressed.    [name] Darenda Fike   [facility] Specialty Pharmacist

## 2020-11-03 ENCOUNTER — Encounter: Admit: 2020-11-03 | Discharge: 2020-11-04 | Payer: MEDICARE

## 2020-11-03 DIAGNOSIS — I10 Essential (primary) hypertension: Secondary | ICD-10-CM | POA: Diagnosis not present

## 2020-11-03 DIAGNOSIS — R809 Proteinuria, unspecified: Secondary | ICD-10-CM | POA: Diagnosis not present

## 2020-11-03 DIAGNOSIS — N181 Chronic kidney disease, stage 1: Secondary | ICD-10-CM | POA: Diagnosis not present

## 2020-11-03 LAB — RENAL FUNCTION PANEL
ALBUMIN: 3.7 g/dL (ref 3.4–5.0)
ANION GAP: 7 mmol/L (ref 5–14)
BLOOD UREA NITROGEN: 10 mg/dL (ref 9–23)
BUN / CREAT RATIO: 11
CALCIUM: 9.5 mg/dL (ref 8.7–10.4)
CHLORIDE: 101 mmol/L (ref 98–107)
CO2: 30 mmol/L (ref 20.0–31.0)
CREATININE: 0.87 mg/dL — ABNORMAL HIGH
EGFR CKD-EPI AA FEMALE: 83 mL/min/{1.73_m2} (ref >=60)
EGFR CKD-EPI NON-AA FEMALE: 72 mL/min/{1.73_m2} (ref >=60)
GLUCOSE RANDOM: 75 mg/dL (ref 70–179)
PHOSPHORUS: 3.9 mg/dL (ref 2.4–5.1)
POTASSIUM: 5.1 mmol/L — ABNORMAL HIGH (ref 3.4–4.8)
SODIUM: 138 mmol/L (ref 135–145)

## 2020-11-03 LAB — PROTEIN / CREATININE RATIO, URINE
CREATININE, URINE: 138 mg/dL
PROTEIN URINE: 20.7 mg/dL
PROTEIN/CREAT RATIO, URINE: 0.15

## 2020-11-04 MED FILL — MYCOPHENOLATE SODIUM 360 MG TABLET,DELAYED RELEASE: ORAL | 90 days supply | Qty: 180 | Fill #2

## 2020-11-04 NOTE — Unmapped (Signed)
Patient left VM on nurse line stating that she had MRI lumbar spine done and was requesting results of test..    Patient stated she is still in pain and would like to know what the plan is.    Future appointment: 01/04/21    Please advise

## 2020-11-05 ENCOUNTER — Encounter: Admit: 2020-11-05 | Discharge: 2020-11-06 | Payer: MEDICARE | Attending: Nephrology | Primary: Nephrology

## 2020-11-05 DIAGNOSIS — N181 Chronic kidney disease, stage 1: Secondary | ICD-10-CM | POA: Diagnosis not present

## 2020-11-05 DIAGNOSIS — I10 Essential (primary) hypertension: Secondary | ICD-10-CM | POA: Diagnosis not present

## 2020-11-05 DIAGNOSIS — M329 Systemic lupus erythematosus, unspecified: Secondary | ICD-10-CM | POA: Diagnosis not present

## 2020-11-05 DIAGNOSIS — Z6837 Body mass index (BMI) 37.0-37.9, adult: Principal | ICD-10-CM

## 2020-11-05 DIAGNOSIS — E669 Obesity, unspecified: Principal | ICD-10-CM
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
## 2020-11-09 ENCOUNTER — Other Ambulatory Visit: Payer: Self-pay | Admitting: Family Medicine

## 2020-11-09 ENCOUNTER — Institutional Professional Consult (permissible substitution): Admit: 2020-11-09 | Discharge: 2020-11-10 | Payer: MEDICARE | Attending: Psychologist | Primary: Psychologist

## 2020-11-09 DIAGNOSIS — F331 Major depressive disorder, recurrent, moderate: Principal | ICD-10-CM

## 2020-11-09 DIAGNOSIS — F4310 Post-traumatic stress disorder, unspecified: Principal | ICD-10-CM

## 2020-11-09 DIAGNOSIS — F410 Panic disorder [episodic paroxysmal anxiety] without agoraphobia: Principal | ICD-10-CM

## 2020-11-09 DIAGNOSIS — F411 Generalized anxiety disorder: Principal | ICD-10-CM

## 2020-11-09 NOTE — Unmapped (Signed)
[facility]   Confidential Psychological Therapy Session      Patient Name: Caitlin Smith  Medical Record Number: [id number]  Date of Service: November 09, 2020  Attending Psychologist: Caroline More, PhD  CPT Procedure Codes: 81191 for 45 mins of face to face counseling    Due to the current declared state emergency during the coronavirus pandemic, all non-urgent medical and mental health visits have been triaged to either be delayed or take place virtually via phone or videoconferencing.   Due to the need for continued patient interaction for mental health care, pain management, and care coordination that necessitates the involvement of this provider, this visit was performed face to face using interactive technology using a HIPPA compliant audio/visual platform. This patient will be scheduled for face to face visits in the future once it has been deemed safe.      We reviewed confidentiality today. The patient was present at home (location and contact information confirmed), attended this visit alone, and consented to this virtual pain psychology visit.     Visit modifiers:   GT for Interactive Technology and CR for catastrophe/disaster related due to coronavirus pandemic    REFERRING PHYSICIAN: Clarene Essex, MD    CHIEF COMPLAINT AND REASON FOR VISIT: pain coping skills, CBT to address depression and anxiety in the setting of chronic pain    SUBJECTIVE / HISTORY OF PRESENT ILLNESS: Caitlin Smith is a very pleasant 65 y.o.  female from [place], [place] with multiple chronic pain complaints related to fibromyalgia and lupus who initially met with me in October 2016, and which time she was diagnosed with severe depression, PTSD, panic disorder, and generalized anxiety.The patient returns for a therapy session today. Last follow up with me was 09/14/20.    Patient participates actively throughout her therapy session today.  Overall she is doing about the same.  Pain is about the same but she feels she is coping relatively well.  Did have an MRI which showed no significant changes from prior MRI.  She believes her increased pain may be due to nerve pain, myofascial pain.  Discussed importance of activation and exercise.  She typically likes to get out and walk and increase her walking throughout the spring and even into the summer.  She plans to start a walking regimen particularly now that she knows her back is stable with no spine structural changes.  Reviewed pacing and activity rest cycling today.    Patient psychiatry resident is leaving and she will have a new resident this summer.  I assured her I will be continuing and can help to assist with continuity.  She worries what will happen after her last psychiatry visit in April when she will not have coverage up until the new resident starts in the summer.  I also assured her that her current resident will likely still be available to assist with care should she need to call the office line, up until the resident change.  Patient appreciative.    OBJECTIVE / MENTAL STATUS:    Appearance:   Appears stated age and Clean/Neat   Motor:  No abnormal movements   Speech/Language:   Normal rate, volume, tone, fluency   Mood:  Depressed and Anxious   Affect:  blunted   Thought process:  Logical, linear, clear, coherent, goal directed   Thought content:    Denies SI, HI, self harm, delusions, obsessions, paranoid ideation, or ideas of reference   Perceptual disturbances:  Denies auditory and visual hallucinations, behavior not concerning for response to internal stimuli   Orientation:  Oriented to person, place, time, and general circumstances   Attention:  Able to fully attend without fluctuations in consciousness   Concentration:  Able to fully concentrate and attend   Memory:  Immediate, short-term, long-term, and recall grossly intact    Fund of knowledge:   Consistent with level of education and development   Insight:    Fair   Judgment:   Intact Impulse Control:  Intact     DIAGNOSTIC IMPRESSION:   Post Traumatic Stress Disorder (PTSD)  Generalized Anxiety Disorder (GAD)  Panic Disorder  Major Depressive Disorder, moderate, recurrent  Chronic pain syndrome  Fibromyalgia  Lupus    ASSESSMENT:   Caitlin Smith is a very pleasant 65 y.o.  female from [place], [place] with multiple chronic pain complaints related to lupus, arthritis, and fibromyalgia. She was previously seen in our clinic from 2012-2014, and reestablished care with Dr. Fayrene Fearing in August 2016. The patient is struggling with depression and anxiety, but is very motivated to participate in intensive, multidisciplinary treatment to address the connection between depression, anxiety and pain. She has a long-standing history of depression and anxiety, and has worked with outpatient psychiatrist and therapist over the years. She is currently established with [facility] psychiatry, and is tapering off of Klonopin.  Depression, anxiety, and panic have all improved.  Has been diagnosed with diabetes, and is managing it well with p.o. medication and dietary changes.  Had lost 50+ pounds but regained 20 lbs recently.  Patient presents with low-grade depression related to COVID and some social isolation.       PLAN:   (1) Psychotherapy - Continue CBT. CBT will be used to address PTSD, MDD, Panic D/o, GAD, and chronic pain.     --Behavioral Activation - Encouraged behavioral activation.  Is doing great, walking 5 days/week  --Downloaded and uses Insight Timer, guided relaxation app, for nightly practice.  --Continues to utilize diaphragmatic breathing daily    (2) Psychiatry - Pt currently established with [facility].  - to follow up with her psychiatrist and discuss low mood despite reduced stress and increased behavioral activation. To ask about possibility of adjusting mood stabilizer (Lamictal) versus other possible changes to help improve mood. (in psychotherapy will cont beh activation and identified need for increased socialization - this has changed post covid related school changes, is alone much more these days with her granddaughter back in school)    (3) Safety - Pt denies any current or recent SI or safety concerns. Knows to call 911 or go to her local ED. Also previously given [facility].      (4) follow-up - with me in 1.5 months

## 2020-11-10 ENCOUNTER — Encounter: Admit: 2020-11-10 | Discharge: 2020-11-11 | Payer: MEDICARE

## 2020-11-10 DIAGNOSIS — H25013 Cortical age-related cataract, bilateral: Secondary | ICD-10-CM | POA: Diagnosis not present

## 2020-11-10 DIAGNOSIS — M329 Systemic lupus erythematosus, unspecified: Secondary | ICD-10-CM | POA: Diagnosis not present

## 2020-11-10 DIAGNOSIS — H04123 Dry eye syndrome of bilateral lacrimal glands: Secondary | ICD-10-CM | POA: Diagnosis not present

## 2020-11-10 DIAGNOSIS — Z79899 Other long term (current) drug therapy: Secondary | ICD-10-CM | POA: Diagnosis not present

## 2020-11-10 DIAGNOSIS — H43393 Other vitreous opacities, bilateral: Secondary | ICD-10-CM | POA: Diagnosis not present

## 2020-11-10 DIAGNOSIS — H269 Unspecified cataract: Principal | ICD-10-CM

## 2020-11-10 NOTE — Unmapped (Signed)
1. Plaquenil 200mg bid since appx 2000 for lupus  - 94 kg  - patient denies changes in vision  - normal exam and testing.  2. Dry eyes  -artificial tears 4/4  3 syneresis OU- stable.  4. cataract- not visually significant. Observe. New rx provided.      rtc 1 year OCT FAF, VF  Cc Dr. R. Ishizawar    INTERPRETATION EXTENDED OPHTHALMOSCOPT MACULA (90Dlens)  Macula - right:  Normal, no maculopathy, +syneresis  Macula - left:  Normal, no maculopathy, + syneresis

## 2020-11-17 NOTE — Unmapped (Signed)
Pt interested in MBBs, looks like this was discussed at last clinic appt. Pt was reporting axial LBP but also RLE radiation so MRI updated. MRI shows no significant change from 2017 and at most mild central canal stenosis, and only mild NF narrowing at multiple levels. She does have some mild to moderate facet hypertrophy at multiple levels. Reasonable to try MBBs as diagnostic tool to confirm facet related etiology for axial LBP.      Orders Placed This Encounter   Procedures   ??? Facet Inj Lumbar/Sacral Single Level (16109)     Bilateral L3-5 MBB #1  [facility]     Standing Status:   Future     Standing Expiration Date:   11/17/2021   ??? Facet Inj Lumbar/Sacral 2nd Level (60454)     Standing Status:   Future     Standing Expiration Date:   11/17/2021         MRI L-spine 10/2020    There is mild lumbar dextrorotatory scoliosis centered at the L2-L3 level. Trace retrolisthesis of L5 on S1. The remaining lumbar vertebral bodies are normally aligned.   ??  Bone marrow signal intensity is normal. The visualized cord is unremarkable and the conus medullaris ends at a normal level.  ??  A Schmorl's node is noted within the inferior endplate of T11. Heights of the remaining vertebral bodies are preserved. There is multilevel disc desiccation. Mild loss of intervertebral disc height at L5-S1.   ??  T12-L1 and L1-L2: No significant canal or neural foraminal stenosis. Mild facet degeneration and ligamentum flavum thickening.  ??  L2-L3: Circumferential disc bulge with mild facet hypertrophy and ligamentum flavum thickening. No significant central canal stenosis. Mild right greater than left neural foraminal narrowing.   ??  L3-L4: Circumferential disc bulge with mild facet arthropathy and moderate ligamentum flavum thickening. Mild central canal stenosis. Mild bilateral neural foraminal narrowing.  ??  L4-L5: Circumferential disc bulge with a tiny, superimposed shallow central disc protrusion. Moderate facet hypertrophy and ligamentum flavum thickening. Mild central spinal canal narrowing. Mild left than right neural foraminal narrowing.  ??  L5-S1: Mild circumferential disc bulge. Mild bilateral facet hypertrophy and ligamentum flavum thickening. No central canal stenosis. Mild bilateral neural foraminal narrowing.   ??  The paraspinal tissues are within normal limits.  ??  For the purposes of this dictation, the lowest well formed intervertebral disc space is assumed to be the L5-S1 level, and there are presumed to be five lumbar-type vertebral bodies.  ??  IMPRESSION:  Multilevel degenerative lumbar spondylosis resulting in at most mild canal or central canal stenosis. Overall, findings are not significantly changed when compared to prior MRI from 2017.

## 2020-11-26 ENCOUNTER — Ambulatory Visit: Admit: 2020-11-26 | Discharge: 2020-11-27 | Payer: MEDICARE

## 2020-11-26 DIAGNOSIS — M329 Systemic lupus erythematosus, unspecified: Secondary | ICD-10-CM | POA: Diagnosis not present

## 2020-11-26 DIAGNOSIS — M797 Fibromyalgia: Secondary | ICD-10-CM | POA: Diagnosis not present

## 2020-11-26 DIAGNOSIS — Z79899 Other long term (current) drug therapy: Secondary | ICD-10-CM | POA: Diagnosis not present

## 2020-11-26 DIAGNOSIS — L408 Other psoriasis: Secondary | ICD-10-CM | POA: Diagnosis not present

## 2020-11-26 DIAGNOSIS — M48061 Spinal stenosis, lumbar region without neurogenic claudication: Secondary | ICD-10-CM | POA: Diagnosis not present

## 2020-11-26 NOTE — Unmapped (Addendum)
Recommend you get second Moderna booster 3-5 months after your last booster as you are considered high risk immunocompromised.     You are also a candidate for Evusheld. Please read about and let me know if you are interested in receiving it. It cannot be given any sooner than two weeks after the booster.    No change in medications. Labs to be done in two months. Ordered for you to complete at any [facility] related lab facility.    Your fibromyalgia is more active based on history and exam. Exercise can help.      Patient Education     Fact Sheet for Patients, Parents And Caregivers   Emergency Use Authorization (EUA) of EVUSHELD???   (tixagevimab co-packaged with cilgavimab)   for Coronavirus Disease 2019 (COVID-19)    You are being given this Fact Sheet because your healthcare provider believes it is necessary to provide you with EVUSHELD (tixagevimab co-packaged with cilgavimab) for pre-exposure prophylaxis for prevention of coronavirus disease 2019 (COVID-19) caused by the SARS-CoV-2 virus.    This Fact Sheet contains information to help you understand the potential risks and potential benefits of taking EVUSHELD, which you have received or may receive.    The U.S. Food and Drug Administration (FDA) has issued an Emergency Use Authorization (EUA) to make EVUSHELD available during the COVID-19 pandemic (for more details about an EUA please see What is an Emergency Use Authorization? at the end of this document). EVUSHELD is not an FDA-approved medicine in the [place].    Read this Fact Sheet for information about EVUSHELD. Talk to your healthcare provider if you have any questions. It is your choice to receive or not receive EVUSHELD.    What is COVID-19?  COVID-19 is caused by a virus called a coronavirus. You can get COVID-19 through close contact with another person who has the virus.    COVID-19 illnesses have ranged from very mild (including some with no reported symptoms) to severe, including illness resulting in death. While information so far suggests that most COVID-19 illness is mild, serious illness can happen and may cause some of your other medical conditions to become worse. Older people and people of all ages with severe, long-lasting (chronic) medical conditions like heart disease, lung disease, and diabetes, for example, seem to be at higher risk of being hospitalized for COVID-19.    What is EVUSHELD (tixagevimab co-packaged with cilgavimab)?  EVUSHELD is an investigational medicine used in adults and adolescents (66 years of age and older who weigh at least 88 pounds [40 kg]) for pre-exposure prophylaxis for prevention of COVID-19 in persons who are:  ?? not currently infected with SARS-CoV-2 and who have not had recent known close contact with someone who is infected with SARS-CoV-2 and  ?? Who have moderate to severe immune compromise due to a medical condition or have received immunosuppressive medicines or treatments and may not mount an adequate immune response to COVID-19 vaccination or  ?? For whom vaccination with any available COVID-19 vaccine, according to the approved or authorized schedule, is not recommended due to a history of severe adverse reaction (such as severe allergic reaction) to a COVID-19 vaccine(s) or COVID-19 vaccine ingredient(s).    EVUSHELD is investigational because it is still being studied. There is limited information known about the safety and effectiveness of using EVUSHELD for pre-exposure prophylaxis for prevention of COVID-19. EVUSHELD is not authorized for post-exposure prophylaxis for prevention of COVID-19.    The FDA has authorized the  emergency use of EVUSHELD for pre-exposure prophylaxis for prevention of COVID-19 under an Emergency Use Authorization (EUA).    What should I tell my healthcare provider before I receive EVUSHELD?  Tell your healthcare provider if you:  ?? Have any allergies  ?? Have low numbers of blood platelets (which help blood clotting), a bleeding disorder, or are taking anticoagulants (to prevent blood clots)  ?? Have had a heart attack or stroke, have other heart problems, or are at high-risk of cardiac (heart) events  ?? Are pregnant or plan to become pregnant  ?? Are breastfeeding a child  ?? Have any serious illness  ?? Are taking any medications (prescription, over-the-counter, vitamins, or herbal products)     How will I receive EVUSHELD?  ?? EVUSHELD consists of two investigational medicines, tixagevimab and cilgavimab.  ?? You will receive 1 dose of EVUSHELD, consisting of 2 separate injections (tixagevimab and cilgavimab).  ?? EVUSHELD will be given to you by your healthcare provider as 2 intramuscular injections. They are usually, given one after the other, 1 into each of your buttocks.    After the initial dose, if your healthcare provider determines that you need to receive additional doses of EVUSHELD for ongoing protection, the additional doses would be administered once every 6 months.    Who should generally not take EVUSHELD?  Do not take EVUSHELD if you have had a severe allergic reaction to EVUSHELD or any ingredient in EVUSHELD.    What are the important possible side effects of EVUSHELD?  Possible side effects of EVUSHELD are:  ?? Allergic reactions. Allergic reactions can happen during and after injection of EVUSHELD. Tell your healthcare provider right away if you get any of the following signs and symptoms of allergic reactions: fever, chills, nausea, headache, shortness of breath, low or high blood pressure, rapid or slow heart rate, chest discomfort or pain, weakness, confusion, feeling tired, wheezing, swelling of your lips, face, or throat, rash including hives, itching, muscle aches, dizziness and sweating. These reactions may be severe or life threatening.  ?? Cardiac (heart) events: Serious cardiac adverse events have happened, but were not common, in people who received EVUSHELD and also in people who did not receive EVUSHELD in the clinical trial studying pre-exposure prophylaxis for prevention of COVID-19. In people with risk factors for cardiac events (including a history of heart attack), more people who received EVUSHELD experienced serious cardiac events than people who did not receive EVUSHELD. It is not known if these events are related to [facility] or underlying medical conditions. Contact your healthcare provider or get medical help right away if you get any symptoms of cardiac events, including pain, pressure, or discomfort in the chest, arms, neck, back, stomach or jaw, as well as shortness of breath, feeling tired or weak (fatigue), feeling sick (nausea), or swelling in your ankles or lower legs.    The side effects of getting any medicine by intramuscular injection may include pain, bruising of the skin, soreness, swelling, and possible bleeding or infection at the injection site.    These are not all the possible side effects of EVUSHELD. Not a lot of people have been given EVUSHELD. Serious and unexpected side effects may happen. EVUSHELD is still being studied so it is possible that all of the risks are not known at this time.    It is possible that EVUSHELD may reduce your body's immune response to a COVID-19 vaccine. If you have received a COVID-19 vaccine, you should wait to  receive EVUSHELD until at least 2 weeks after COVID-19 vaccination.    What other prevention choices are there?  Vaccines to prevent COVID-19 are approved or available under Emergency Use Authorization. Use of EVUSHELD does not replace vaccination against COVID-19. For more information about other medicines authorized for treatment or prevention of COVID-19 go to [email or web address] for more information.    It is your choice to receive or not receive EVUSHELD. Should you decide not to receive EVUSHELD, it will not change your standard medical care.    EVUSHELD is not authorized for post-exposure prophylaxis of COVID-19.    What if I am pregnant or breastfeeding?  If you are pregnant or breastfeeding, discuss your options and specific situation with your healthcare provider.    How do I report side effects with EVUSHELD?  Contact your healthcare provider if you have any side effects that bother you or do not go away. Report side effects to FDA MedWatch at [employer] or call 1-800-FDA-1088 or call [employer] at ([phone].    Additional Information  If you have questions, visit the website or call the telephone number provided below.    Website  Telephone number    [email or web address]  [phone]      How can I learn more about COVID-19?  ?? Ask your healthcare provider.  ?? Visit: [email or web address]    ?? Contact your local or state public health department.    What is an Emergency Use Authorization?  The [place] FDA has made EVUSHELD (tixagevimab co-packaged with cilgavimab) available under an emergency access mechanism called an Emergency Use Authorization EUA. The EUA is supported by a [employer]or, [employer] and Human Service (HHS) declaration that circumstances exist to justify the emergency use of drugs and biological products during the COVID-19 pandemic.    EVUSHELD for pre-exposure prophylaxis for prevention of coronavirus disease 2019 (COVID-19) caused by the SARS-CoV-2 virus has not undergone the same type of review as an FDA-approved product. In issuing an EUA under the COVID-19 public health emergency, the FDA has determined, among other things, that based on the total amount of scientific evidence available including data from adequate and well-controlled clinical trials, if available, it is reasonable to believe that the product may be effective for diagnosing, treating, or preventing COVID-19, or a serious or life-threatening disease or condition caused by COVID-19; that the known and potential benefits of the product, when used to diagnose, treat, or prevent such disease or condition, outweigh the known and potential risks of such product; and that there are no adequate, approved and available alternatives.    All of these criteria must be met to allow for the product to be used in the treatment of patients during the COVID-19 pandemic. The EUA for EVUSHELD is in effect for the duration of the COVID-19 declaration justifying emergency use of EVUSHELD,  unless terminated or revoked (after which EVUSHELD may no longer be used under the EUA).    Distributed by: [employer], [place], [place] [address]    Manufactured by: [employer], [address], [place], [place] [address], [place]    ??[employer] 2021. All rights reserved.     Patient Education   Frequently Asked Questions on the Emergency Use Authorization for Evusheld (tixagevimab co-packaged with cilgavimab) for Pre-exposure Prophylaxis (PrEP) of COVID-19     Q. What is an Emergency Use Authorization (EUA)?   A: Under section 564 of the [employer] Act, after a declaration by the [facility] Secretary based on one  of four types of determinations, FDA may authorize an unapproved product or unapproved uses of an approved product for emergency use. In issuing an EUA, FDA must determine, among other things, that based on the totality of scientific evidence available to the Agency, including data from adequate and well-controlled clinical trials, if available, it is reasonable to believe that the product may be effective in diagnosing, treating, or preventing a serious or life-threatening disease or condition caused by a chemical, biological, radiological, or nuclear agent; that the known and potential benefits, when used to treat, diagnose or prevent such disease or condition, outweigh the known and potential risks for the product; and that there are no adequate, approved, and available alternatives. Emergency use authorization is NOT the same as FDA approval or licensure.     Q. What does this EUA authorize? What are the limitations of authorized use?   A: The EUA authorizes [employer]???s Evusheld (tixagevimab co-packaged with cilgavimab) for emergency use as pre-exposure prophylaxis for prevention of COVID-19 in adults and pediatric individuals (93 years of age and older weighing at least 40 kg):     ??? Who are not currently infected with SARS-CoV-2 and who have not had a known recent exposure to an individual infected with SARS-CoV-2 and  o Who have moderate to severe immune compromise due to a medical condition or receipt of immunosuppressive medications or treatments and may not mount an adequate immune response to COVID-19 vaccination or  o For whom vaccination with any available COVID-19 vaccine, according to the approved or authorized schedule, is not recommended due to a history of severe adverse reaction (e.g., severe allergic reaction) to a COVID-19 vaccine(s) and/or COVID-19 vaccine component(s).     Limitations of Authorized Use     ??? Evusheld is not authorized for use in individuals:   o For treatment of COVID-19, or   o For post-exposure prophylaxis of COVID-19 in individuals who have been exposed to someone infected with SARS-CoV-2.   ??? Pre-exposure prophylaxis with Evusheld is not a substitute for vaccination in individuals for whom COVID-19 vaccination is recommended. Individuals for whom COVID-19 vaccination is recommended, including individuals with moderate to severe immune compromise who may derive benefit from COVID-19 vaccination, should receive COVID-19 vaccination.   ??? In individuals who have received a COVID-19 vaccine, Evusheld should be administered at least two weeks after vaccination.     Q. What are some medical conditions or treatments that may lead to an inadequate immune response to the COVID-19 vaccination?   A: Medical conditions or treatments that may result in moderate to severe immunocompromise and an inadequate immune response to COVID-19 vaccination include but are not limited to:   ??? Active treatment for solid tumor and hematologic malignancies   ??? Receipt of solid-organ transplant and taking immunosuppressive therapy   ??? Receipt of chimeric antigen receptor (CAR)-T-cell or hematopoietic stem cell transplant (within 2 years of transplantation or taking immunosuppression therapy)   ??? Moderate or severe primary immunodeficiency (e.g., DiGeorge syndrome, Wiskott-Aldrich syndrome)   ??? Advanced or untreated HIV infection (people with HIV and CD4 cell counts)  ??? Active treatment with high-dose corticosteroids (i.e., ?20 mg prednisone or equivalent per day when administered for ?2 weeks), alkylating agents, antimetabolites, transplant-related immunosuppressive drugs, cancer chemotherapeutic agents classified as severely immunosuppressive, tumor-necrosis (TNF) blockers, and other biologic agents that are immunosuppressive or immunomodulatory (e.g., B-cell depleting agents)     For additional information, refer to the [facility] Vaccines & Immunizations website.     Q.  Is Evusheld approved by the FDA to prevent or treat COVID-19?   A. No. Evusheld is not FDA-approved to prevent or treat any diseases or conditions, including COVID-19. Evusheld is an investigational drug.     Q. How can Evusheld be obtained for use under the EUA?   A. For questions on how to obtain Evusheld, please contact COVID19therapeutics@hhs .gov.     Q. Who may prescribe Evusheld under the EUA?   A. Evusheld may only be prescribed for an individual patient by physicians, advanced practice registered nurses, and physician assistants that are licensed or authorized under state law to prescribe drugs in the therapeutic class to which Evusheld belongs (i.e., anti-infectives).     Q. Are tixagevimab and cilgavimab monoclonal antibodies? What is a monoclonal antibody?   A. Yes, tixagevimab and cilgavimab are monoclonal antibodies. Monoclonal antibodies are laboratory-produced molecules engineered to serve as substitute antibodies that can restore, enhance or mimic the immune system's attack on pathogens. Evusheld is designed to block viral attachment and entry into human cells, thus neutralizing the virus.     Q. When should Evusheld be administered to a patient?   A. Patients should talk to their health care provider to determine whether, based on their individual circumstances, they are eligible to receive Evusheld, and when it should be administered. More information about administration is available in the Fact Sheet for Health Care Providers.     Q. Are there potential side effects of Evusheld?   A. Possible side effects of Evusheld include the following:     Allergic reactions can happen during and after injection of Evusheld. Reactions to Evusheld may include difficulty breathing or swallowing; shortness of breath; wheezing; swelling of the face, lips, tongue or throat; rash including hives; or itching.     The side effects of getting any medicine by intramuscular injection may include pain, bruising of the skin, soreness, swelling, and possible bleeding or infection at the injection site.     Serious cardiac adverse events (such as myocardial infarction and heart failure) were infrequent in the clinical trial evaluating Evusheld for pre-exposure prophylaxis for prevention. However, more trial participants had serious cardiac adverse events after receiving Evusheld compared to placebo. These participants all had risk factors for cardiac disease or a history of cardiovascular disease before participating in the clinical trial. It is not clear if Evusheld caused these cardiac adverse events.     These are not all the possible side effects of Evusheld. Not a lot of people have been given Evusheld. Serious and unexpected side effects may happen. Evusheld is still being studied so it is possible that all of the risks are not known at this time.     Q. Are there reporting requirements for health care facilities and providers as part of the EUA?   A. Yes. As part of the EUA, FDA requires health care providers who prescribe Evusheld to report all medication errors and serious adverse events considered to be potentially related to Evusheld through FDA???s MedWatch Adverse Event Reporting program.     Health care facilities and providers must report therapeutics information and utilization data as directed by the U.S. Department of Health and [employer].     Q. Do patient outcomes need to be reported under the EUA?   A. No, reporting of patient outcomes is not required under the EUA. However, reporting of all medication errors and serious adverse events considered to be potentially related to Evusheld occurring during treatment is required.  Q. FDA has issued a number of EUAs including for therapeutics. If state laws impose different or additional requirements on the medical product covered by an EUA, are those state laws preempted?   A. As stated in FDA???s Emergency Use Authorization of Medical Products and Related Authorities Guidance, ???FDA believes that the terms and conditions of an EUA issued under section 564 preempt state or local law, both legislative requirements and common-law duties, that impose different or additional requirements on the medical product for which the EUA was issued in the context of the emergency declared under section 564.??? The guidance explains the basis for FDA???s views on this subject.     Q. Can health care providers share the patient/caregiver Fact Sheet electronically?   A. The letter of authorization for Evusheld, requires that Fact Sheets be made available to health care providers and to patients/caregivers ???through appropriate means.??? Electronic delivery of the patient/caregiver Fact Sheet is an appropriate means. For example, when the patient requests the Fact       Sheet electronically, it can be delivered as a PDF prior to medication administration. Health care providers should confirm receipt of the Fact Sheet with the patient.     Q. Can I receive a COVID-19 vaccine if I was treated with a monoclonal antibody for COVID-19?   A. Patients and health care providers should refer to recommendations of the Advisory Committee on Immunization Practices regarding vaccination.     Q. Can I receive Evusheld if I recently received a COVID-19 vaccine?   A. Evusheld may reduce your body???s immune response to a COVID-19 vaccine. If you receive a COVID-19 vaccine, you should wait to receive Evusheld until at least two weeks after your COVID-19 vaccination.     Q. Are there data showing Evusheld may provide benefit for pre-exposure prophylaxis for prevention of COVID-19 in certain patients?   A. Yes. The most important scientific evidence supporting the authorization of Evusheld is from [employer], a randomized, double-blind, placebo-controlled clinical trial in adults who had not received a COVID-19 vaccine and did not have a history of SARS-CoV-2 infection or test positive for SARS-CoV-2 infection at the start of the trial. All trial participants were either ?63 years of age, had a pre-specified co-morbidity (obesity, congestive heart failure, chronic obstructive pulmonary disease, chronic kidney disease, chronic liver disease, immunocompromised state, or previous history of severe or serious adverse event after receiving any approved vaccine), or were at increased risk of SARS-CoV-2 infection due to their living situation or occupation.     The main outcome measured in the trial was whether the trial participant had a case of documented COVID-19 after receiving Evusheld or placebo and before day 183 of the trial. In this trial, 3,441 people received Evusheld and 1,731 received a placebo. In the primary analysis, Evusheld recipients saw a 77% reduced risk of developing COVID-19 compared to those who received a placebo, a statistically significant difference. In additional analyses, the reduction in risk of developing COVID-19 was maintained for Evusheld recipients through six months. The safety and effectiveness of this investigational therapy for use in the pre-exposure prevention of COVID-19 continue to be evaluated.

## 2020-11-26 NOTE — Unmapped (Signed)
Rheumatology Clinic Follow-up Note     Assessment/Plan:    Caitlin Smith is a 65 y.o.  female with a past medical history of SLE +ANA, +dsDNA, clinically manifested as arthritis and episcleritis. History of organizing pneumonia who presents today for follow-up    1. Systemic lupus erythematosus, unspecified SLE type, unspecified organ involvement status (CMS-HCC)  Overall her SLE with minimal disease activity.  - Continue with Plaquenil 200 mg twice daily and Myfortic 360 mg twice daily.   - Disease monitoring labs as listed below    2. On mycophenolate mofetil therapy  Medication monitoring labs below. Previous labs in January 2022 stable    3. Fibromyalgia  Diffuse body aches likely secondary to inactivity and core muscle weakness  - Continue following pain clinic  - Continue with gabapentin, flexeril and duloxetine  - Ambulatory referral to Physical Therapy; Future    4. Inverse psoriasis  Biopsy proven. Overall well controlled with topicals  - Continue with topicals and vaseline    5. Spinal stenosis of lumbar region, unspecified whether neurogenic claudication present/chronic mechanical low back pain/ rotator cuff tendinopathy and shoulder impingement syndrome   - Ambulatory referral to Physical Therapy; Future    6. Organizing pneumonia  - Last appointment with pulmonology in 2020. Per last note her findings more consistent with organizing pneumonia.  - Currently on myfortic 360 mg BID  - Encouraged patient to follow up with clinic        Return in about 9 months (around 08/28/2021).    Caitlin Smith was seen today for follow up and joint pain.    Diagnoses and all orders for this visit:    Systemic lupus erythematosus, unspecified SLE type, unspecified organ involvement status (CMS-HCC)  -     AST; Future  -     ALT; Future  -     C3 complement; Future  -     C4 complement; Future  -     Urinalysis with Culture Reflex; Future  -     CBC w/ Differential; Future  -     Creatinine; Future  -     Anti-DNA antibody, double-stranded; Future  -     Protein/Creatinine Ratio, Urine; Future  -     25 OH Vit D; Future  -     Serum Protein Electrophoresis and Immunofixation; Future    On mycophenolate mofetil therapy  -     AST; Future  -     ALT; Future  -     C3 complement; Future  -     C4 complement; Future  -     Urinalysis with Culture Reflex; Future  -     CBC w/ Differential; Future  -     Creatinine; Future  -     Anti-DNA antibody, double-stranded; Future  -     Protein/Creatinine Ratio, Urine; Future  -     25 OH Vit D; Future  -     Serum Protein Electrophoresis and Immunofixation; Future    Fibromyalgia  -     Ambulatory referral to Physical Therapy; Future    Inverse psoriasis    Spinal stenosis of lumbar region, unspecified whether neurogenic claudication present  -     Ambulatory referral to Physical Therapy; Future         Health Maintenance  Routine health maintenance discussed  - PCV13 Status: 05/26/2016  - PPSV 23 Status: 08/04/2016  - Annual Influenza vaccine. Status: 06/2020  - COVID-19 vaccine:08/25/2020, 12/23/2019, 11/20/2019.  Discussed booster with patient as she eligible. We also discussed Evusheld. Gave information and she is to think about it before giving booster.   - Bone health: Dexa 11/2017 with normal BMD.   - Plaquenil eye exam: 11/2018 normal. Update yearly.  - Contraception: postmenopausal      Patient understands and agrees to plan above.    Patient was seen and discussed with attending physician, Dr. Meyer Russel, MD  Rheumatology Fellow PGY4    Primary Care Provider: Ruel Favors, MD      Interval HPI. Last saw Caitlin Smith 08/04/2020.  She is currently on Myfortic 360 mg twice daily, Plaquenil 200 mg twice daily with Last saw ophtho in 11/2020.  Labs stable 09/2020 inclduing C3/C4 and antidsdna. Imaging xray shoulder BL AC arthrosis, MRI back showed osteoarthritis.  Cardiology appointment 12/302021 for chest pain. Pain clinic 09/2020 increased gabapentin and xrays. Plan for injections the lower back. 3/3/21CKD 1 no active sediemnt.     Today, Patient with no overall concerns. She is tolerating her prescribed medications and is taking them as prescribed. Her breathing is stable and not worsening. She has a follow up appointment with Caitlin Smith 6/29. She complains of dry eyes and dry mouth for the past few years but have not worsen recently. She use biotene with improvement. She continues to have diffuse body pain mostly in her shoulders and lower back and occasional headaches. Morning stiffness lasting only a few minutes 10-15 minutes  in her knees and hands. No jaw pain or visual symptoms. Headaches improve with tylenol. Overall her inverse psoriasis is well controlled. She denies any fevers/chills, chest pain, abdominal pain, joint swelling, oral/nasal ulcers, patchy hair loss, or new rashes.       Initial HPI:  Caitlin Smith is a 65 y.o.  female with a past medical history of SLE, OA, and fibromyalgia.  Initially diagnosed in 2006 with SLE presenting with episcleritis and arthralgia with serology showing +ANA, +dsDNA.  She also has stage I CKD felt secondary not due to SLE but due to HTN and obesity.   In 04/2016 she was dx with eosinophilic pneumonitis, for which she follows with pulmonology.  Treatment history:  - HCQ  - Previously treated with prednisone for eosinophilic pneumonitis with difficulty tapering due to pulm symptoms, so cellcept was added 2018.   - Cellcept changed to myfortic 2019 due to GI distress.   - tapered off prednisone in late 2019  - Derm eval in 07/2018 consistent with inverse psoriasis, possibly due to prednisone use. Recommended avoiding systemic steroids when possible.   - Current med regimen: Myfortic 360 mg twice daily, Plaquenil 200 mg twice daily.  Also followed by pain clinic for pharmacotherapy for fibromyalgia.    Review of Systems:  Positive findings noted above, otherwise a 14 point review of systems was reviewed and negative    Past Medical, Surgical, Family and Social History reviewed and updated per EMR     Allergies:  Vilazodone, Ace inhibitors, Bee pollen, Penicillin g, Penicillins, and Pollen extracts    Medications:   Outpatient Encounter Medications as of 11/26/2020   Medication Sig Dispense Refill   ??? amLODIPine (NORVASC) 5 MG tablet Take 1 tablet (5 mg total) by mouth daily. 90 tablet 3   ??? aspirin (ECOTRIN) 81 MG tablet Take 81 mg by mouth daily.     ??? atenoloL (TENORMIN) 25 MG tablet Take 25 mg by mouth daily.     ??? atorvastatin (LIPITOR) 40  MG tablet Take 40 mg by mouth daily.     ??? biotin 1 mg cap Take by mouth.     ??? buPROPion (WELLBUTRIN XL) 150 MG 24 hr tablet Take 1 tablet (150 mg total) by mouth every morning. 90 tablet 2   ??? calcipotriene (DOVONOX) 0.005 % ointment Apply every other day to areas of rash in folds as maintenance 60 g 2   ??? clobetasoL (TEMOVATE) 0.05 % ointment Apply topically nightly. 45 g 3   ??? clonazePAM (KLONOPIN) 0.5 MG tablet Take 0.5 tablets (0.25 mg total) by mouth nightly. 15 tablet 2   ??? clotrimazole-betamethasone (LOTRISONE) 1-0.05 % cream Apply 1 application topically Two (2) times a day.     ??? cyclobenzaprine (FLEXERIL) 5 MG tablet Take 1 tablet (5 mg total) by mouth two (2) times a day as needed. 60 tablet 2   ??? diclofenac sodium (VOLTAREN) 1 % gel Apply 2 g topically two (2) times a day as needed for arthritis. 300 g 5   ??? dicyclomine (BENTYL) 20 mg tablet Take 20 mg by mouth every six (6) hours.     ??? FLOVENT HFA 110 mcg/actuation inhaler USE 2 PUFFS BY MOUTH TWO  TIMES DAILY 36 g 3   ??? fluticasone propionate (FLONASE) 50 mcg/actuation nasal spray      ??? gabapentin (NEURONTIN) 300 MG capsule Take 2 capsules (600 mg total) by mouth Three (3) times a day. 360 capsule 2   ??? halobetasol (ULTRAVATE) 0.05 % ointment Apply topically Two (2) times a day. 50 g 1   ??? hydrOXYchloroQUINE (PLAQUENIL) 200 mg tablet Take 1 tablet (200 mg total) by mouth Two (2) times a day. 180 tablet 3   ??? inhalational spacing device (AEROCHAMBER MV) Spcr 1 each by Miscellaneous route two (2) times a day. With Fovent 1 each 0   ??? ketoconazole (NIZORAL) 2 % cream APP EXT AA D  0   ??? lamoTRIgine (LAMICTAL) 200 MG tablet Take 1 tablet (200 mg total) by mouth Two (2) times a day. 180 tablet 1   ??? meclizine (ANTIVERT) 25 mg tablet Take 25 mg by mouth Three (3) times a day as needed.      ??? melatonin 10 mg Tab Take 1 tablet by mouth.     ??? metroNIDAZOLE (METROGEL) 0.75 % gel Apply one applicatorful (~37.5 mg metronidazole) intravaginally once daily for 5 days. 45 g 0   ??? mupirocin (BACTROBAN) 2 % ointment APP EXT IEN BID  0   ??? mycophenolate (MYFORTIC) 360 MG TbEC Take 1 tablet by mouth twice daily 180 tablet 3   ??? omeprazole (PRILOSEC) 40 MG capsule Take 40 mg by mouth daily.      ??? ondansetron (ZOFRAN) 4 MG tablet      ??? polyethylene glycol (GLYCOLAX) 17 gram/dose powder 2 cap fulls in a full glass of water, three times a day, for 5 days.     ??? telmisartan (MICARDIS) 80 MG tablet Take 80 mg by mouth.     ??? traZODone (DESYREL) 100 MG tablet Take 1.5 tablets (150 mg total) by mouth nightly for 90 doses. 135 tablet 0   ??? triamcinolone (KENALOG) 0.1 % ointment Apply twice a day to affected areas when rough, otherwise just on the weekends 454 g 1   ??? venlafaxine (EFFEXOR-XR) 37.5 MG 24 hr capsule Take 2 capsules (75 mg total) by mouth daily. 180 capsule 1     No facility-administered encounter medications on file as of 11/26/2020.  Objective  g  Vitals:    11/26/20 1027   BP: 146/81   BP Site: L Arm   BP Position: Sitting   BP Cuff Size: Large   Pulse: 81   Temp: 36.1 ??C   TempSrc: Temporal   Weight: (!) 106.4 kg (234 lb 9.6 oz)       Physical Exam  General: well appearing, no acute distress  Eyes: EOMI, normal conjunctivae   ENT: MMM.  Oropharynx without any erythema or exudate.  No oral or nasal ulcers.  Neck: supple. No cervical lymphadenopathy  Cardiovascular: Regular rate and rhythm. No murmurs, rubs or gallops.   Pulmonary: Clear to auscultation bilaterally except for mild lower lobe crackles. Normal work of breathing.  Skin: no rash, lesions, breakdown. No purpura or petechiae. No digital ulcers.   Extremities: Warm and well perfused, no cyanosis, clubbing or edema  Musculoskeletal: No swelling in the hands, wrists, elbows, shoulders, knees, ankles, or feet. Full ROM throughout except for below.  Diffuse tenderness to palpation in her paraspinal muscles from neck down to lower lumbar area. TTP anterior, later posterior shoulders. Positive empty can test and impingement test bilaterally. Decreased ROM with internal and external rotation. TTP in her knees but good ROM. Bilateral hips with decrease ROM bilaterally. tender points in her upper arms and lateral hipos.   Neurologic: Cranial nerves grossly intact, strength 5/5 throughout.  Normal sensation  Psychiatric: Normal mood and affect.    Labs:  Lab Results   Component Value Date    WBC 5.4 09/15/2020    RBC 4.65 09/15/2020    HGB 12.9 09/15/2020    HCT 39.6 09/15/2020    MCV 85.1 09/15/2020    MCH 27.8 09/15/2020    MCHC 32.7 09/15/2020    RDW 14.1 09/15/2020    PLT 330 09/15/2020    MPV 7.2 09/15/2020        Chemistry        Component Value Date/Time    NA 138 11/03/2020 0842    NA 139 01/27/2015 0931    K 5.1 (H) 11/03/2020 0842    K 4.7 01/27/2015 0931    CL 101 11/03/2020 0842    CL 99 01/27/2015 0931    CO2 30.0 11/03/2020 0842    CO2 26 01/27/2015 0931    BUN 10 11/03/2020 0842    BUN 12 04/08/2019 0943    BUN 14 08/07/2012 1252    CREATININE 0.87 (H) 11/03/2020 0842    CREATININE 1.06 (H) 04/08/2019 0943    CREATININE 1.0 12/25/2014 0935    CREATININE 0.92 08/07/2012 1252    CREATININE 1.0 06/24/2011 1148    GLU 75 11/03/2020 0842        Component Value Date/Time    CALCIUM 9.5 11/03/2020 0842    CALCIUM 9.2 01/27/2015 0931    ALKPHOS 98 11/09/2019 1503    ALKPHOS 95 11/05/2014 0933    AST 24 04/02/2020 0933    AST 24 04/08/2019 0943    ALT 23 04/02/2020 0933    ALT 13 04/08/2019 0943    BILITOT 0.7 11/09/2019 1503    BILITOT 0.3 11/05/2014 0933          Lab Results   Component Value Date    ANA POSITIVE (A) 11/05/2014    PAT1 HOMOGENOUS 11/05/2014       Lab Results   Component Value Date    C3 125 09/15/2020    C4 21.2 09/15/2020    CKTOTAL 179.0 (H) 03/04/2015  CRP <5.0 10/26/2016    ESR 74 (H) 11/11/2015       Lab Results   Component Value Date    CREATUR 138.0 11/03/2020    PROTEINUR 20.7 11/03/2020    PCRATIOUR 0.150 11/03/2020       No results found for: EJO1, ERNP, ESCL, ESMA, ESSA, ESSB    Lab Results   Component Value Date    HBSAG Nonreactive 05/06/2016       Lab Results   Component Value Date    NITRITE Negative 09/15/2020    PROTEINUA Negative 09/15/2020    RBCUA 3 09/15/2020    WBC 5.4 09/15/2020    BLOODU Negative 09/15/2020    KETONESU Negative 09/15/2020       Lab Results   Component Value Date    ANCA  05/06/2016      Comment:      These results have uncertain diagnostic significance.    ANCAIFA Positive, Perinuclear Pattern (A) 05/06/2016    MPO Negative 05/06/2016    MPOQT 5.5 05/06/2016    PR3ELISA Negative 05/06/2016

## 2020-11-29 ENCOUNTER — Other Ambulatory Visit: Payer: Self-pay | Admitting: Family Medicine

## 2020-11-29 DIAGNOSIS — I10 Essential (primary) hypertension: Secondary | ICD-10-CM

## 2020-11-29 NOTE — Telephone Encounter (Signed)
Approved per protocol.  

## 2020-12-01 ENCOUNTER — Encounter: Payer: Self-pay | Admitting: Family Medicine

## 2020-12-07 ENCOUNTER — Encounter: Payer: Self-pay | Admitting: Family Medicine

## 2020-12-11 NOTE — Progress Notes (Signed)
Name: Nicole Ballard   MRN: [id number]    DOB: 08/14/1956   Date:12/14/2020       Progress Note  Subjective  Chief Complaint  Follow Up  HPI  HTN:She hasrecurrentchest pain and has seen cardiologist, she states chest pain not as frequent now, she had a stress test done [facility] that was normal, bp today is at goal today however she states at home has been around 150/94 , occasionally within normal limits. Reminded her to sit down and rest for 10 minutes to check bp , also to bring her monitor to our office to make sure reading is correct . She was getting more anxious when checking bp all the time. Continue Atenolol 25,  Norvasc 5 mg and Telmisartan 80 mg   INTERPRETATION( done 02/2019 ) Normal Stress Echocardiogram NORMAL RIGHT VENTRICULAR SYSTOLIC FUNCTION TRIVIAL REGURGITATION NOTED  NO VALVULAR STENOSIS NOTED  History of DM: denies polyphagia, polyuria or polydipsia, she has gradually gaining weight, she states she is a stress eater, weight is up 10 lbs, but A1C is still under control, up from 5.5% to 5.7 %. She is eating healthy, she knows glucose goes up when she has steroid injections for her back and is afraid of glucose going up.   MDD/Mild Neurocognitive Impairment: under the care of psychiatrist, Dr. Valere Dross at [facility] psychiatry. , she is taking Effexor 75 mg, Lamictal, Wellbutrion and Klonopin qhs,  she still feels down at times, she states her resident is leaving [facility] soon and waiting for another psychiatrist.   Dyslipidemia:reviewed last labs, LDL was 74 October 2021, continue statin therapy   Post-inflammatory pulmonary fibrosis/RAD: under the care of pulmonologist at [facility], she has occasional cough she continues to have SOB - likely multifactorial. She has been using Flovent prn, unchanged    Lupus involvement lungs: she is seeing Rheumatologist , taking plaquenil but continues to have some SOB , eye exam is also up to date   Obesity: her BMI was down to 29.94 but  gradually went up and   BMI is almost 35 again, she states her back has increased and not as active lately.   GERD: she She is on PPI, sates symptoms are controlled   Vertigo: she still has intermittent symptoms takes Meclizine and also zofran from chronic nauseaShe states episodes not as often now. Unchanged   Empty Sella: she had repeat MRI, no longer have  pituitary adenoma. Unchanged   Chronic pain from/Neurogenic claudication Lupus and FMS: going to pain clinic, she states her back pain is intense and sometimes she feels like she needs to use a cane again, she is scheduled for a spinal injection   Eczema: she has medication given by Dermatologist, no rashes   Patient Active Problem List   Diagnosis Date Noted  . Hyperglycemia 10/23/2017  . Vertigo 09/29/2016  . Bronchiolitis obliterans organizing pneumonia ([place]) 09/29/2016  . Neck pain, musculoskeletal 12/15/2015  . Abnormal CAT scan 04/11/2015  . Auditory impairment 04/11/2015  . Insomnia, persistent 04/11/2015  . Chronic kidney disease (CKD), stage I 04/11/2015  . Chronic nonmalignant pain 04/11/2015  . Chronic constipation 04/11/2015  . Dyslipidemia 04/11/2015  . Fibromyalgia 04/11/2015  . Gastro-esophageal reflux disease without esophagitis 04/11/2015  . Dysphonia 04/11/2015  . Low back pain 04/11/2015  . Eczema intertrigo 04/11/2015  . Keratosis pilaris 04/11/2015  . Chronic recurrent major depressive disorder ([place]) 04/11/2015  . Dysmetabolic syndrome 28/78/6767  . Neurogenic claudication ([place]) 04/11/2015  . Perennial allergic rhinitis with seasonal variation 04/11/2015  .  Abnormal presence of protein in urine 04/11/2015  . Seborrhea capitis 04/11/2015  . Moderate dysplasia of vulva 04/11/2015  . Vitamin D deficiency 04/11/2015  . Spinal stenosis of lumbar region 04/11/2015  . Treadmill stress test negative for angina pectoris 03/05/2015  . Shortness of breath 05/20/2014  . Asthma, chronic 12/31/2013  . H/O  total knee replacement 12/31/2013  . Episcleritis 09/26/2013  . Vocal cord dysfunction 05/28/2013  . Anxiety, generalized 03/19/2013  . Back pain 12/18/2012  . Pulmonary nodules 11/26/2012  . Lung nodule, multiple 11/26/2012  . Arthritis, degenerative 11/01/2012  . H/O aspiration pneumonitis 10/22/2012  . Postinflammatory pulmonary fibrosis ([place]) 10/22/2012  . Mixed incontinence 05/04/2012  . Benign neoplasm of pituitary gland and craniopharyngeal duct ([place]) 12/29/2010  . Benign neoplasm of pituitary gland ([place]) 12/29/2010  . Lung involvement in systemic lupus erythematosus ([place]) 11/05/2010  . Chronic nausea 07/01/2008  . Benign hypertension 01/25/2005    Past Surgical History:  Procedure Laterality Date  . BRAIN SURGERY    . CHOLECYSTECTOMY    . VAGINAL HYSTERECTOMY      Family History  Problem Relation Age of Onset  . Breast cancer Mother   . Cancer Mother 16       Breast  . Cancer Father 69       Stomach  . Breast cancer Maternal Aunt     Social History   Tobacco Use  . Smoking status: Never Smoker  . Smokeless tobacco: Never Used  Substance Use Topics  . Alcohol use: No    Alcohol/week: 0.0 standard drinks     Current Outpatient Medications:  .  amLODipine (NORVASC) 5 MG tablet, TAKE 1 TABLET BY MOUTH  DAILY, Disp: 90 tablet, Rfl: 1 .  aspirin EC 81 MG tablet, Take 1 tablet (81 mg total) by mouth daily., Disp: 30 tablet, Rfl: 0 .  atenolol (TENORMIN) 25 MG tablet, TAKE 1 TABLET(25 MG) BY MOUTH AT BEDTIME, Disp: 90 tablet, Rfl: 0 .  atorvastatin (LIPITOR) 40 MG tablet, TAKE 1 TABLET BY MOUTH  DAILY, Disp: 90 tablet, Rfl: 3 .  Biotin 1 MG CAPS, Take 1 capsule by mouth daily., Disp: 30 capsule, Rfl: 0 .  buPROPion (WELLBUTRIN XL) 150 MG 24 hr tablet, Take 150 mg by mouth every morning., Disp: , Rfl:  .  Cholecalciferol (VITAMIN D) 2000 UNITS CAPS, Take 1 capsule by mouth daily., Disp: , Rfl:  .  clobetasol ointment (TEMOVATE) 0.05 %, Apply topically., Disp: ,  Rfl:  .  clonazePAM (KLONOPIN) 0.5 MG tablet, Take 0.5 mg by mouth daily., Disp: , Rfl:  .  clotrimazole-betamethasone (LOTRISONE) cream, Apply 1 application topically 2 (two) times daily., Disp: 45 g, Rfl: 0 .  cyclobenzaprine (FLEXERIL) 5 MG tablet, TAKE 1 TABLET BY MOUTH  EVERY 8 HOURS AS NEEDED FOR MUSCLE SPASMS. DO NOT DRIVE FOR 8 HOURS, Disp: 90 tablet, Rfl: 1 .  diclofenac sodium (VOLTAREN) 1 % GEL, APP 2 GRAMS EXT AA QID, Disp: , Rfl: 6 .  fluticasone (FLONASE) 50 MCG/ACT nasal spray, Place 2 sprays into both nostrils daily., Disp: 48 g, Rfl: 3 .  gabapentin (NEURONTIN) 300 MG capsule, Take 300 mg (1 capsule) in the morning and at 300 mg (1 capsule) at noon and 600 mg (2 capsules) at night., Disp: , Rfl:  .  hydroxychloroquine (PLAQUENIL) 200 MG tablet, Take by mouth 2 (two) times daily., Disp: , Rfl:  .  lamoTRIgine (LAMICTAL) 200 MG tablet, Take 200 mg by mouth 2 (two) times daily., Disp: ,  Rfl:  .  meclizine (ANTIVERT) 25 MG tablet, Take 1 tablet (25 mg total) by mouth 3 (three) times daily as needed., Disp: 30 tablet, Rfl: 0 .  Melatonin 10 MG TABS, Take 1 tablet by mouth at bedtime., Disp: , Rfl:  .  mycophenolate (MYFORTIC) 360 MG TBEC EC tablet, Take 360 mg by mouth 2 (two) times daily., Disp: , Rfl:  .  olopatadine (PATANOL) 0.1 % ophthalmic solution, Place 1 drop into both eyes 2 (two) times daily., Disp: 5 mL, Rfl: 3 .  omeprazole (PRILOSEC) 40 MG capsule, TAKE 1 CAPSULE BY MOUTH  DAILY, Disp: 90 capsule, Rfl: 3 .  Polyethylene Glycol 3350 (MIRALAX PO), Take by mouth., Disp: , Rfl:  .  telmisartan (MICARDIS) 80 MG tablet, TAKE 1 TABLET BY MOUTH  DAILY, Disp: 90 tablet, Rfl: 1 .  traZODone (DESYREL) 100 MG tablet, Take 150 mg by mouth at bedtime., Disp: , Rfl:  .  triamcinolone ointment (KENALOG) 0.1 %, , Disp: , Rfl:  .  venlafaxine XR (EFFEXOR-XR) 37.5 MG 24 hr capsule, Take 37.5 mg by mouth 2 (two) times daily., Disp: , Rfl:   Allergies  Allergen Reactions  . Ace Inhibitors      Other reaction(s): OTHER Pt states she can not take ace inhibitors. Pt states she can not remember her reaction.   . Bee Pollen   . Penicillins   . Pollen Extract     I personally reviewed active problem list, medication list, allergies, family history, social history with the patient/caregiver today.   ROS   Constitutional: Negative for fever, positive for weight change.  Respiratory: Negative for cough and shortness of breath.   Cardiovascular: Negative for chest pain or palpitations.  Gastrointestinal: Negative for abdominal pain, no bowel changes.  Musculoskeletal: positive  for gait problem and intermittent knee  joint swelling.  Skin: Negative for rash.  Neurological: positive for intermittent  dizziness but no  headache.  No other specific complaints in a complete review of systems (except as listed in HPI above).  Objective  Vitals:   12/14/20 0803  BP: 130/72  Pulse: 72  Resp: 16  Temp: 98.7 F (37.1 C)  TempSrc: Oral  SpO2: 96%  Weight: 236 lb (107 kg)  Height: 5\' 8"  (1.727 m)    Body mass index is 35.88 kg/m.  Physical Exam  Constitutional: Patient appears well-developed and well-nourished. Obese  No distress.  HEENT: head atraumatic, normocephalic, pupils equal and reactive to light, , neck supple Cardiovascular: Normal rate, regular rhythm and normal heart sounds.  No murmur heard. No BLE edema. Pulmonary/Chest: Effort normal and breath sounds normal. No respiratory distress. Abdominal: Soft.  There is no tenderness. Psychiatric: Patient has a normal mood and affect. behavior is normal. Judgment and thought content normal.  Recent Results (from the past 2160 hour(s))  POCT HgB A1C     Status: Abnormal   Collection Time: 12/14/20  8:05 AM  Result Value Ref Range   Hemoglobin A1C 5.7 (A) 4.0 - 5.6 %   HbA1c POC (<> result, manual entry)     HbA1c, POC (prediabetic range)     HbA1c, POC (controlled diabetic range)        PHQ2/9: Depression  screen [facility] 2/9 12/14/2020 08/13/2020 06/15/2020 02/14/2020 01/24/2020  Decreased Interest 1 1 2 1  0  Down, Depressed, Hopeless 1 1 2  0 1  PHQ - 2 Score 2 2 4 1 1   Altered sleeping 1 0 0 0 -  Tired, decreased energy 3  1 2 1  -  Change in appetite 3 1 3  0 -  Feeling bad or failure about yourself  1 0 1 1 -  Trouble concentrating 1 0 0 0 -  Moving slowly or fidgety/restless 0 1 0 0 -  Suicidal thoughts 0 0 0 0 -  PHQ-9 Score 11 5 10 3  -  Difficult doing work/chores - Somewhat difficult Somewhat difficult Not difficult at all -  Some recent data might be hidden    phq 9 is positive   Fall Risk: Fall Risk  12/14/2020 08/13/2020 06/15/2020 02/14/2020 01/24/2020  Falls in the past year? 0 0 0 0 0  Number falls in past yr: 0 0 0 0 -  Comment - - - - -  Injury with Fall? 0 0 0 0 -  Risk for fall due to : - No Fall Risks - - Medication side effect  Risk for fall due to: Comment - - - - -  Follow up - Falls prevention discussed - Falls evaluation completed Falls prevention discussed     Functional Status Survey: Is the patient deaf or have difficulty hearing?: Yes Does the patient have difficulty seeing, even when wearing glasses/contacts?: No Does the patient have difficulty concentrating, remembering, or making decisions?: Yes Does the patient have difficulty walking or climbing stairs?: Yes Does the patient have difficulty dressing or bathing?: No Does the patient have difficulty doing errands alone such as visiting a doctor's office or shopping?: No    Assessment & Plan  1. History of DM  - POCT HgB A1C  2. Chronic recurrent major depressive disorder ([place])   3. Empty sella ([place])   4. Postinflammatory pulmonary fibrosis ([place])  Keep follow up with pulmonologist  5. Dyslipidemia   6. Fluctuating blood pressure  She will bring her monitor   7. Benign hypertension   8. Perennial allergic rhinitis with seasonal variation  - loratadine (CLARITIN) 10 MG tablet; Take 1  tablet (10 mg total) by mouth daily.  Dispense: 90 tablet; Refill: 1 - olopatadine (PATANOL) 0.1 % ophthalmic solution; Place 1 drop into both eyes 2 (two) times daily.  Dispense: 5 mL; Refill: 1  9. History of diabetes mellitus, type II   10. Neurogenic claudication ([place])  On gabapentin and seeing pain clinic   11. Lung involvement in systemic lupus erythematosus ([place])  She will get COVID-19 booster

## 2020-12-14 ENCOUNTER — Encounter: Payer: Self-pay | Admitting: Family Medicine

## 2020-12-14 ENCOUNTER — Other Ambulatory Visit: Payer: Self-pay

## 2020-12-14 ENCOUNTER — Ambulatory Visit (INDEPENDENT_AMBULATORY_CARE_PROVIDER_SITE_OTHER): Payer: Medicare Other | Admitting: Family Medicine

## 2020-12-14 VITALS — BP 130/72 | HR 72 | Temp 98.7°F | Resp 16 | Ht 68.0 in | Wt 236.0 lb

## 2020-12-14 DIAGNOSIS — F339 Major depressive disorder, recurrent, unspecified: Secondary | ICD-10-CM | POA: Diagnosis not present

## 2020-12-14 DIAGNOSIS — E785 Hyperlipidemia, unspecified: Secondary | ICD-10-CM

## 2020-12-14 DIAGNOSIS — I998 Other disorder of circulatory system: Secondary | ICD-10-CM

## 2020-12-14 DIAGNOSIS — G9519 Other vascular myelopathies: Secondary | ICD-10-CM

## 2020-12-14 DIAGNOSIS — M3213 Lung involvement in systemic lupus erythematosus: Secondary | ICD-10-CM | POA: Diagnosis not present

## 2020-12-14 DIAGNOSIS — J3089 Other allergic rhinitis: Secondary | ICD-10-CM

## 2020-12-14 DIAGNOSIS — J8410 Pulmonary fibrosis, unspecified: Secondary | ICD-10-CM

## 2020-12-14 DIAGNOSIS — J302 Other seasonal allergic rhinitis: Secondary | ICD-10-CM | POA: Diagnosis not present

## 2020-12-14 DIAGNOSIS — I10 Essential (primary) hypertension: Secondary | ICD-10-CM

## 2020-12-14 DIAGNOSIS — E1169 Type 2 diabetes mellitus with other specified complication: Secondary | ICD-10-CM

## 2020-12-14 DIAGNOSIS — E236 Other disorders of pituitary gland: Secondary | ICD-10-CM

## 2020-12-14 DIAGNOSIS — R739 Hyperglycemia, unspecified: Secondary | ICD-10-CM

## 2020-12-14 DIAGNOSIS — Z8639 Personal history of other endocrine, nutritional and metabolic disease: Secondary | ICD-10-CM

## 2020-12-14 LAB — POCT GLYCOSYLATED HEMOGLOBIN (HGB A1C): Hemoglobin A1C: 5.7 % — AB (ref 4.0–5.6)

## 2020-12-14 MED ORDER — LORATADINE 10 MG PO TABS: 10.0000 mg | ORAL_TABLET | Freq: Every day | ORAL | 1 refills | Status: DC

## 2020-12-14 MED ORDER — ATENOLOL 25 MG PO TABS: 25.0000 mg | ORAL_TABLET | Freq: Every day | ORAL | 3 refills | Status: DC

## 2020-12-14 MED ORDER — OLOPATADINE HCL 0.1 % OP SOLN: 1.0000 [drp] | Freq: Two times a day (BID) | OPHTHALMIC | 1 refills | Status: DC

## 2020-12-14 NOTE — Patient Instructions (Signed)
Tdap

## 2020-12-16 ENCOUNTER — Encounter
Admit: 2020-12-16 | Discharge: 2020-12-17 | Payer: MEDICARE | Attending: Student in an Organized Health Care Education/Training Program | Primary: Student in an Organized Health Care Education/Training Program

## 2020-12-16 MED ORDER — CLONAZEPAM 0.5 MG TABLET
ORAL_TABLET | Freq: Every evening | ORAL | 2 refills | 30 days | Status: CP
Start: 2020-12-16 — End: ?

## 2020-12-16 NOTE — Unmapped (Signed)
Pre-procedure call. Denies recent infection/antibiotics. Continues with ASA 81mg  and will hold morning of procedure.

## 2020-12-16 NOTE — Unmapped (Signed)
[facility]  Psychiatry Telehealth Encounter  Established Patient - Video Visit    I spent 35 minutes on the real-time audio and video with the patient. I spent an additional 10 minutes on pre- and post-visit activities.     The patient was physically located in [place] or a state in which I am permitted to provide care. The patient and/or parent/guardian understood that s/he may incur co-pays and cost sharing, and agreed to the telemedicine visit. The visit was reasonable and appropriate under the circumstances given the patient's presentation at the time.    The patient and/or parent/guardian has been advised of the potential risks and limitations of this mode of treatment (including, but not limited to, the absence of in-person examination) and has agreed to be treated using telemedicine. The patient's/patient's family's questions regarding telemedicine have been answered.     If the visit was completed in an ambulatory setting, the patient and/or parent/guardian has also been advised to contact their provider???s office for worsening conditions, and seek emergency medical treatment and/or call 911 if the patient deems either necessary.    Assessment:  Caitlin Smith is a 65 y.o. female with a history of MDD, GAD, and Panic Disorder in the context of many chronic medical conditions including chronic pain/fibromyalgia, osteoarthritis with degenerative disc disease, Lupus, pituitary tumor (benign), s/p right total knee arthroplasty, and VIN II (s/p resection), who presents for follow-up evaluation. She has been maintained on Lamictal for many years as well as cymbalta, trazodone, klonopin, and wellbutrin. Patient has previously tolerated a slight taper in her klonopin from 0.75 mg to 0.25 mg qHS overtime. Her mood is often exacerbated by steroid use for current treatment of pulmonary disease. Patient is engaged with CBT therapy at the pain clinic.    Met Caitlin Smith today 12/16/20 for epic video-visit. She reports continued improved mood on effexor 37.5 mg BID.  Taper of trazodone has been unsuccessful at this point; she continues on 150mg . She continues on her lamictla (400mg ) and bupropion (150mg ) as well. Overall she feels like she is managing well with respect to her mood; she is stressed about resident transition. Will follow-up in ~2 months.     Risk Assessment:  A full suicide and violence risk assessment was performed as part of this patient's initial evaluation with [facility] outpatient psychiatry.  There is no new acute risk for suicide or violence at this time. The patient was educated about relevant modifiable risk factors including following recommendations for treatment of psychiatric illness and abstaining from substance abuse.  While future psychiatric events cannot be accurately predicted, the patient does not currently require acute inpatient psychiatric care and does not currently meet [facility] involuntary commitment criteria.    Current Medication Regimen:  -- Lamictal 400mg  qhs  -- Effexor 37.5 mg BID  -- Wellbutrin XL 150mg  qAM   -- Clonazepam 0.25mg  qHS  -- Trazodone 150mg  at bedtime    Past medications:  Cymbalta    Plan:    # Depression - Anxiety - Panic Disorder  Status: Stable, improved  - Continue Lamictal 400mg  qhs for mood  --Continue effexor 37.5mg  BID  - Continue Wellbutrin XL 150mg  qAM    --Might consider this again as target for taper which would be relatively simple in future  - Continue Clonazepam 0.25mg  qHS, appropriate filling per NCCSRS  - Continue CBT at pain clinic; advised her to see if therapist willing to talk some about childhood, which is a stressor for patient   -  Family excursions/engagement are really helpful to Caitlin Smith who values family. Softball season is here and she is going to her granddaughter's games.    # Insomnia: Last sleep study performed in 2017. Per chart review, showed mild OSA likely related to medication use. Neurology recommends patient avoid sleeping on back as she likely has OSA when sleeping in this position  Status: Stable, improved on current medication regimen   --Continue melatonin 3-5mg  at bedtime   -- Continue trazodone 150mg  qHS for insomnia (d5/4/21, d 02/05/20, i9/8)  - Clonazepam per above    # Mild Neurocognitive Impairment: Patient seen by Neurology ([facility]) 05/22/18 who dx pt with mild neurocognitive impairment w/ deficits in inattention and visual-spatial function with suspicion that sleep apnea and polypharmacy may be contributing to her difficulties. Recommended that psychopharm regimen be simplified, including eliminating trazodone and considering changing Wellbutrin to Zoloft or Lexapro for anxiety and eliminating Klonopin when stable.     Status: Stable. No new concerns reported by patient although we have been limited by covid as to [facility] or other neurocog testing,, we have not been able to decrease clonazepam, wellbutrin or trazodone. Pandemic and social atmosphere make changes in pharm more challenging at this time  - Polypharmacy a concern  - However, it is very likely that patient's lupus is significantly contributing to cognitive decline, which will limit improvement she will see with reduction in polypharmacy  --Last MOCA 22 on 12/12/2017    # Medical Monitoring - High Risk Medication Use  Status: Stable   - The complexity of this patient's care involves drug therapy which requires intensive monitoring for toxicity. This is in part due to a narrow therapeutic window, as well as potential for toxicity. This patient is being treated with Lamotrigine (Lamictal) to target their psychiatric disorder, Major depressive disorder, refractory. In regards to this medication being considered high risk, Lamotrigine (lamictal) has black box warning for serious rash including fatal reaction of Stevens-Johnson syndrome and rare cases of toxic epidermal necrolysis. In addition to prolonged titration to mitigate risk of serious rash, lamotrigine requires monitoring of hepatic and renal function at baseline, and at times will require monitoring of lamotrigine blood levels.    Lamotrigine Monitoring  - Hepatic function testing, platelets (via CBC) q6 months  -AST/ALT WNL     Effexor   --Impaired renal function can decrease clearance of effexor  --Labs stable as of 1/11  --Will continue to monitor along with PCP given dx lupus    Lab Results   Component Value Date    WBC 5.4 09/15/2020    HGB 12.9 09/15/2020    HCT 39.6 09/15/2020    PLT 330 09/15/2020       Lab Results   Component Value Date    NA 138 11/03/2020    K 5.1 (H) 11/03/2020    CL 101 11/03/2020    CO2 30.0 11/03/2020    BUN 10 11/03/2020    CREATININE 0.87 (H) 11/03/2020    GLU 75 11/03/2020    CALCIUM 9.5 11/03/2020    MG 2.1 12/31/2013    PHOS 3.9 11/03/2020       Lab Results   Component Value Date    BILITOT 0.7 11/09/2019    BILIDIR <0.10 05/01/2018    PROT 7.3 04/02/2020    ALBUMIN 3.7 11/03/2020    ALT 23 04/02/2020    AST 24 04/02/2020    ALKPHOS 98 11/09/2019    GGT 19 08/26/2012  Lab Results   Component Value Date    PT 12.0 09/17/2012    INR 1.11 01/03/2014    APTT 30.8 01/03/2014     Creatinine Whole Blood, POC   Date/Time Value Ref Range Status   12/25/2014 09:35 AM 1.0 0.7 - 1.1 mg/dL Final   16/06/9603 54:09 AM 1.0 0.7 - 1.1 MG/DL Final     Comment:     Performed by:  [facility]  [address], [place], [place] [address]     Creatinine/CP   Date/Time Value Ref Range Status   08/07/2012 12:52 PM 0.92 0.60 - 1.00 MG/DL Final     Creatinine   Date/Time Value Ref Range Status   11/03/2020 08:42 AM 0.87 (H) 0.60 - 0.80 mg/dL Final   47/82/9562 13:08 AM 1.06 (H) 0.57 - 1.00 mg/dL Final     # Return to Clinic: 2-3 months    Psychotherapy:  No billable psychotherapy service provided but brief supportive therapy was utilized.    Patient has been given this writer's contact information as well as the [facility] urgent line number. The patient has been instructed to call 911 for emergencies.    Patient and plan of care were discussed with the Attending MD,Dr. Cecilio Asper, who agrees with the above statement and plan.     Connye Burkitt MD    Subjective:     Psychiatric Chief Concern:  Follow-up psychiatric evaluation for depression, anxiety and insomnia.     Interval History: Caitlin Smith and I meet on video today. She says she has been good but more recently had anxiety thinking about our visit. Specifically she says that she is upset about resident transition and that this is our last visit. We are able to set one more visit for June and discuss resident transition again. Generally she feels OK and is continuing to tolerate her medicines well. She continues to take 75mg  of the effexor. We talk about going to her granddaughter's softball games again which she gets enjoyment out of. Her daughter is going on a cruise; she laughs about how she does not like cruises. She does not endorse suicidality. Physically she continues to be at about baseline.     Social History: reviewed; patient recently moved apartments within the same complex    ROS:  As per Interval History and:  Constitutional:  no significant appetite change. Anxiety, dysthymia bettered   Neuro:  none / negative    Objective:    Mental Status Exam:   Appearance  Well-appeaing, INAD       Speech/Language:    Normal rate, volume, tone, fluency and Language intact, well formed   Affect  Mildly anxious, mood congruent   Mood:   anxious because you're leaving   Thought process and Associations:   Logical, linear, clear, coherent, goal directed   Abnormal/psychotic thought content:     Denies SI, HI, self harm, delusions, obsessions, paranoid ideation, or ideas of reference   Perceptual disturbances:     Does not endorse auditory or visual hallucinations     Orientation:   Oriented to person, place, time, and general circumstances   Insight:     Intact   Judgment:    Intact   Impulse Control: Intact     Physical Exam: UTA phone visit    Medications: reviewed at today's visit    Psychometrics:  Psych Scale Scores - Adult      Office Visit from 09/04/2018 in [facility] Transplant  AT [facility]   **PHQ-9: Severity Measure for DEPRESSION Total Score** 20 Collected on 09/04/2018 0000   WHODAS 2.0 (Self-administered) - Total Score 42 Collected on 09/04/2018 0000          Posey Rea, MD.

## 2020-12-16 NOTE — Unmapped (Signed)
Patient left VM on nurse line stating she is scheduled for injection tomorrow and had received a call from [facility] stating she would need to have a driver with her tomorrow. Patient stated she did not understand what type of procedure she was having  and is requesting someone return her call to let her know about the procedure.    Patient had also sent My Chart message regarding this issue, message was sent to Dr Oneita Kras and he had responded to patient via My Chart.     Spoke with patient and informed her Dr Oneita Kras had responded to her [facility] chart message. Patient stated she had rescheduled procedure to 04/26 as it was difficult for her to find a driver for tomorrow but she will look at Dr Augustina Mood message.

## 2020-12-18 ENCOUNTER — Encounter: Admit: 2020-12-18 | Discharge: 2020-12-18 | Payer: MEDICARE

## 2020-12-25 NOTE — Unmapped (Signed)
Pre-procedure call. Patient called me back. Denies recent infection/antibiotics. Continues to take aspirin 81 mg. Denies taking any recent NSAIDs.         12/25/20 1539   Pre-op Phone Call   Surgery Time Verified Yes   Arrival Time Verified Yes   Surgery Location Verified Yes   Ride and Caregiver Arranged Yes

## 2020-12-25 NOTE — Unmapped (Signed)
Pre-procedure call. Left message regarding appointment time, arrival time, driver necessary, and to call [phone] if any questions/concerns/need to reschedule. Left message to reschedule if experiencing fever, chills, severe fatigue, muscle aches, runny nose, sore throat, recent loss of taste or smell, new or worsening cough, SOB, headache, nausea/vomiting, abdominal pain, or diarrhea. Also please reschedule if you have been in close contact with a COVID-19 positive person in the last 21 days or have tested positive for COVID-19 in the last 21 days.        12/25/20 1532   Pre-op Phone Call   Surgery Time Verified No   Arrival Time Verified No   Surgery Location Verified No   Ride and Caregiver Arranged No

## 2020-12-29 ENCOUNTER — Encounter: Admit: 2020-12-29 | Discharge: 2020-12-30 | Payer: MEDICARE

## 2020-12-29 DIAGNOSIS — M329 Systemic lupus erythematosus, unspecified: Secondary | ICD-10-CM | POA: Diagnosis not present

## 2020-12-29 DIAGNOSIS — Z79899 Other long term (current) drug therapy: Secondary | ICD-10-CM | POA: Diagnosis not present

## 2020-12-29 DIAGNOSIS — M47816 Spondylosis without myelopathy or radiculopathy, lumbar region: Secondary | ICD-10-CM | POA: Diagnosis not present

## 2020-12-29 DIAGNOSIS — M47817 Spondylosis without myelopathy or radiculopathy, lumbosacral region: Principal | ICD-10-CM

## 2020-12-29 LAB — CBC W/ AUTO DIFF
BASOPHILS ABSOLUTE COUNT: 0 10*9/L (ref 0.0–0.1)
BASOPHILS RELATIVE PERCENT: 1.1 %
EOSINOPHILS ABSOLUTE COUNT: 0.1 10*9/L (ref 0.0–0.5)
EOSINOPHILS RELATIVE PERCENT: 3.6 %
HEMATOCRIT: 41 % (ref 34.0–44.0)
HEMOGLOBIN: 13.6 g/dL (ref 11.3–14.9)
LYMPHOCYTES ABSOLUTE COUNT: 1.2 10*9/L (ref 1.1–3.6)
LYMPHOCYTES RELATIVE PERCENT: 32.7 %
MEAN CORPUSCULAR HEMOGLOBIN CONC: 33.3 g/dL (ref 32.0–36.0)
MEAN CORPUSCULAR HEMOGLOBIN: 27.5 pg (ref 25.9–32.4)
MEAN CORPUSCULAR VOLUME: 82.6 fL (ref 77.6–95.7)
MEAN PLATELET VOLUME: 7.6 fL (ref 6.8–10.7)
MONOCYTES ABSOLUTE COUNT: 0.3 10*9/L (ref 0.3–0.8)
MONOCYTES RELATIVE PERCENT: 8.2 %
NEUTROPHILS ABSOLUTE COUNT: 1.9 10*9/L (ref 1.8–7.8)
NEUTROPHILS RELATIVE PERCENT: 54.4 %
NUCLEATED RED BLOOD CELLS: 0 /100{WBCs} (ref <=4)
PLATELET COUNT: 308 10*9/L (ref 150–450)
RED BLOOD CELL COUNT: 4.96 10*12/L (ref 3.95–5.13)
RED CELL DISTRIBUTION WIDTH: 14.5 % (ref 12.2–15.2)
WBC ADJUSTED: 3.6 10*9/L (ref 3.6–11.2)

## 2020-12-29 LAB — URINALYSIS WITH CULTURE REFLEX
BACTERIA: NONE SEEN /HPF
BILIRUBIN UA: NEGATIVE
BLOOD UA: NEGATIVE
GLUCOSE UA: NEGATIVE
KETONES UA: NEGATIVE
NITRITE UA: NEGATIVE
PH UA: 5.5 (ref 5.0–9.0)
PROTEIN UA: NEGATIVE
RBC UA: 0 /HPF (ref <4)
SPECIFIC GRAVITY UA: >=1.03 (ref 1.005–1.040)
SQUAMOUS EPITHELIAL: <1 /HPF (ref 0–5)
UROBILINOGEN UA: 1
WBC UA: 0 /HPF (ref 0–5)

## 2020-12-29 LAB — ALT: ALT (SGPT): 16 U/L (ref 10–49)

## 2020-12-29 LAB — PROTEIN / CREATININE RATIO, URINE
CREATININE, URINE: 189 mg/dL
PROTEIN URINE: 30 mg/dL
PROTEIN/CREAT RATIO, URINE: 0.159

## 2020-12-29 LAB — CREATININE
CREATININE: 0.85 mg/dL — ABNORMAL HIGH
EGFR CKD-EPI AA FEMALE: 85 mL/min/{1.73_m2} (ref >=60)
EGFR CKD-EPI NON-AA FEMALE: 74 mL/min/{1.73_m2} (ref >=60)

## 2020-12-29 LAB — C4 COMPLEMENT: C4 COMPLEMENT: 21.8 mg/dL (ref 12.0–36.0)

## 2020-12-29 LAB — AST: AST (SGOT): 28 U/L (ref <=34)

## 2020-12-29 LAB — C3 COMPLEMENT: C3 COMPLEMENT: 122 mg/dL (ref 90–170)

## 2020-12-29 MED ORDER — CYCLOBENZAPRINE 5 MG TABLET
ORAL_TABLET | Freq: Two times a day (BID) | ORAL | 2 refills | 30 days | Status: CP | PRN
Start: 2020-12-29 — End: ?

## 2020-12-29 MED ORDER — GABAPENTIN 300 MG CAPSULE
ORAL_CAPSULE | Freq: Three times a day (TID) | ORAL | 2 refills | 60 days | Status: CP
Start: 2020-12-29 — End: 2021-02-27

## 2020-12-29 MED ADMIN — lidocaine (XYLOCAINE) 20 mg/mL (2 %) injection: @ 14:00:00 | Stop: 2020-12-29

## 2020-12-29 MED ADMIN — lidocaine (XYLOCAINE) 5 mg/mL (0.5 %) injection: @ 14:00:00 | Stop: 2020-12-29

## 2020-12-29 MED ADMIN — triamcinolone acetonide (KENALOG-40) injection: INTRA_ARTICULAR | @ 14:00:00 | Stop: 2020-12-29

## 2020-12-29 NOTE — Unmapped (Addendum)
Procedure: Bilateral Intra-articular Lumbar Facet Injections under Fluoroscopic Guidance at L4/5 and L5/S1with steroid    Indication: Lumbar Spondylosis without Myelopathy  Performed By: Dr. Fayrene Fearing  Assistant: Dr. Lamount Cranker  Type of Anesthesia: Local    Brief History:  This is a 65 year old female with chronic low back pain and facet hypertrophy on MRI presenting for bilateral lumbar facet procedures.  Plan for intra-articular facet injections with steroid today.  Originally, were considering medial branch blocks but ultimately decided on facet injections intra-articularly.      Informed consent was obtained and potential risks discussed including, but not limited to: bleeding, bruising, severe allergic reaction to components of the injection materials, compression of the spinal cord, infection (superficial, deep, abscess and meningitis), nerve or spinal cord damage, paralysis, inability to place the needle properly, arachnoiditis, the possibility of no benefit (pain relief) derived from the injection, or in rare occasions worsening of pain. Questions were answered to the patient's satisfaction and the patient wishes to proceed.  Alternative options for treatment have previously been discussed and explored with the patient.    The patient denies taking antiplatelet or anticoagulation medications and has a driver today.    PROCEDURE:    Once in the procedure room, the patient was laid prone on the fluoroscopic table with pressure points padded.  A pillow was used to support the abdomen and accentuate the curvature of the lumbar spine.  Standard ASA monitors were applied. A timeout protocol was performed per [facility]. Hand washing with antibacterial soap and water and/or the use of alcohol based cleanser was performed. Proper protective gear was worn by the physician including a mask, scrub cap, and sterile gloves.      A sterile prep with ChloraPrep x2 was performed and a sterile drape was applied.      The facet joints from L4-S1 were visualized using right oblique orientation of the C-arm to approximately 20 degrees.  The overlying skin and subcutaneous tissues were anesthetized with 1% lidocaine using a 25-gauge needle at each level.  Using coaxial technique, a 22-gauge 5 inch spinal needle was advanced intermittently under fluoroscopic guidance into the L4/5 and L5/S1 facet joints.  Negative aspiration for CSF and heme occurred at each level.  Then 0.42mL of a solution containing 1 mL of 2% lidocaine and 1 mL of Kenalog 40 mg/mL was injected into each joint.  The needles were then withdrawn.       Attention was then turned towards the left side where a similar procedure was done in the exact same fashion as described above at the L4/5 and L5/S1 facet joints. The needles were then withdrawn.      The patient was cleaned off and bandages applied.      Total Fluoroscopic time was 12 seconds. Pre-procedure pain score was 5/10. Post-procedure pain score is 4/10.     The patient did tolerate the procedure well. Vital signs were stable. Sensory and motor exam was unchanged from baseline. The patient was observed for 15 minutes, and was ambulating without assistance upon leaving the clinic.    Disposition:  The patient will follow up in clinic on 02/09/2021 as scheduled with Dr. Fayrene Fearing.  If she only gets short-term benefit, could consider medial branch blocks with the goal of RFA.    No orders of the defined types were placed in this encounter.    Requested Prescriptions     Signed Prescriptions Disp Refills   ??? gabapentin (NEURONTIN) 300 MG  capsule 360 capsule 2     Sig: Take 2 capsules (600 mg total) by mouth Three (3) times a day.   ??? cyclobenzaprine (FLEXERIL) 5 MG tablet 60 tablet 2     Sig: Take 1 tablet (5 mg total) by mouth two (2) times a day as needed.       Answers for HPI/ROS submitted by the patient on 12/28/2020  Chronicity: recurrent  Onset: more than 1 year ago  Frequency: constantly  Progression since onset: gradually worsening  Pain location: gluteal, lumbar spine, sacro-iliac  Pain quality: aching, cramping, stabbing  Radiates to: left thigh, right thigh  Pain - numeric: 6/10  Pain is: the same all the time  Aggravated by: bending, position, lying down, sitting, standing  Stiffness is present: all day  abdominal pain: No  bladder incontinence: No  bowel incontinence: No  chest pain: No  dysuria: No  fever: No  headaches: No  leg pain: No  numbness: Yes  paresis: No  paresthesias: Yes  pelvic pain: No  perianal numbness: No  tingling: Yes  weakness: Yes  weight loss: No  Risk factors: history of osteoporosis, lack of exercise, menopause, obesity, poor posture

## 2020-12-30 LAB — VITAMIN D 25 HYDROXY: VITAMIN D, TOTAL (25OH): 51.9 ng/mL (ref 20.0–80.0)

## 2020-12-30 NOTE — Unmapped (Signed)
I was present for the entirety of the procedure(s).

## 2020-12-31 LAB — SERUM PROTEIN ELECTROPHORESIS AND IMMUNOFIXATION
ALBUMIN (SPE): 3.7 g/dL (ref 3.5–5.0)
ALPHA-1 GLOBULIN: 0.3 g/dL (ref 0.2–0.5)
ALPHA-2 GLOBULIN: 0.7 g/dL (ref 0.5–1.1)
BETA-1 GLOBULIN: 0.5 g/dL (ref 0.3–0.6)
BETA-2 GLOBULIN: 0.5 g/dL (ref 0.2–0.6)
GAMMAGLOBULIN: 1.8 g/dL — ABNORMAL HIGH (ref 0.5–1.5)
PROTEIN TOTAL: 7.4 g/dL

## 2020-12-31 LAB — ANTI-DNA ANTIBODY, DOUBLE-STRANDED: DSDNA ANTIBODY: NEGATIVE

## 2021-01-05 ENCOUNTER — Telehealth: Payer: Self-pay | Admitting: Family Medicine

## 2021-01-05 MED ORDER — VENLAFAXINE ER 37.5 MG CAPSULE,EXTENDED RELEASE 24 HR
ORAL_CAPSULE | Freq: Every day | ORAL | 1 refills | 90 days | Status: CP
Start: 2021-01-05 — End: ?

## 2021-01-05 MED ORDER — TRAZODONE 100 MG TABLET
ORAL_TABLET | Freq: Every evening | ORAL | 1 refills | 90 days | Status: CP
Start: 2021-01-05 — End: 2021-04-05

## 2021-01-05 MED ORDER — BUPROPION HCL XL 150 MG 24 HR TABLET, EXTENDED RELEASE
ORAL_TABLET | Freq: Every morning | ORAL | 1 refills | 90 days | Status: CP
Start: 2021-01-05 — End: ?

## 2021-01-05 MED ORDER — LAMOTRIGINE 200 MG TABLET
ORAL_TABLET | Freq: Two times a day (BID) | ORAL | 1 refills | 90 days | Status: CP
Start: 2021-01-05 — End: 2021-04-05

## 2021-01-05 NOTE — Telephone Encounter (Signed)
Pt states she seems to have two dosing directions for her Amlodipine RX. She has 5mg  with directions to take one daily but she thinks it was increased and she should be taking 10MG / please advise on the correct dose

## 2021-01-06 DIAGNOSIS — R6889 Other general symptoms and signs: Secondary | ICD-10-CM | POA: Diagnosis not present

## 2021-01-06 NOTE — Telephone Encounter (Signed)
Spoke with patient and clarified directions per Dr. Ancil Boozer. She was pleasant and verbalized understanding. She had no further questions or concerns at this time but agreed to call back at any time should any arise.

## 2021-01-11 ENCOUNTER — Other Ambulatory Visit: Payer: Self-pay | Admitting: Family Medicine

## 2021-01-11 NOTE — Telephone Encounter (Signed)
Pt informed that prescription has been sent to pharmacy

## 2021-01-18 ENCOUNTER — Encounter: Admit: 2021-01-18 | Discharge: 2021-01-19 | Payer: MEDICARE | Attending: Psychologist | Primary: Psychologist

## 2021-01-18 DIAGNOSIS — F411 Generalized anxiety disorder: Principal | ICD-10-CM

## 2021-01-18 DIAGNOSIS — F410 Panic disorder [episodic paroxysmal anxiety] without agoraphobia: Principal | ICD-10-CM

## 2021-01-18 DIAGNOSIS — F331 Major depressive disorder, recurrent, moderate: Principal | ICD-10-CM

## 2021-01-18 DIAGNOSIS — F4310 Post-traumatic stress disorder, unspecified: Principal | ICD-10-CM
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
## 2021-01-19 ENCOUNTER — Ambulatory Visit: Payer: Self-pay | Admitting: *Deleted

## 2021-01-19 ENCOUNTER — Other Ambulatory Visit: Payer: Self-pay

## 2021-01-19 ENCOUNTER — Emergency Department: Payer: Medicare Other

## 2021-01-19 ENCOUNTER — Emergency Department
Admission: EM | Admit: 2021-01-19 | Discharge: 2021-01-19 | Disposition: A | Discharge status: Home / Self Care | Payer: Medicare Other | Attending: Emergency Medicine | Admitting: Emergency Medicine

## 2021-01-19 DIAGNOSIS — Z79899 Other long term (current) drug therapy: Secondary | ICD-10-CM | POA: Diagnosis not present

## 2021-01-19 DIAGNOSIS — J45909 Unspecified asthma, uncomplicated: Secondary | ICD-10-CM | POA: Diagnosis not present

## 2021-01-19 DIAGNOSIS — I129 Hypertensive chronic kidney disease with stage 1 through stage 4 chronic kidney disease, or unspecified chronic kidney disease: Secondary | ICD-10-CM | POA: Diagnosis not present

## 2021-01-19 DIAGNOSIS — R079 Chest pain, unspecified: Secondary | ICD-10-CM

## 2021-01-19 DIAGNOSIS — I10 Essential (primary) hypertension: Secondary | ICD-10-CM | POA: Diagnosis not present

## 2021-01-19 DIAGNOSIS — E119 Type 2 diabetes mellitus without complications: Secondary | ICD-10-CM | POA: Diagnosis not present

## 2021-01-19 DIAGNOSIS — Z7982 Long term (current) use of aspirin: Secondary | ICD-10-CM | POA: Diagnosis not present

## 2021-01-19 DIAGNOSIS — N181 Chronic kidney disease, stage 1: Secondary | ICD-10-CM | POA: Insufficient documentation

## 2021-01-19 DIAGNOSIS — J9811 Atelectasis: Secondary | ICD-10-CM | POA: Diagnosis not present

## 2021-01-19 DIAGNOSIS — R0789 Other chest pain: Secondary | ICD-10-CM | POA: Diagnosis not present

## 2021-01-19 LAB — CBC
HCT: 39.1 % (ref 36.0–46.0)
Hemoglobin: 12.3 g/dL (ref 12.0–15.0)
MCH: 27.3 pg (ref 26.0–34.0)
MCHC: 31.5 g/dL (ref 30.0–36.0)
MCV: 86.9 fL (ref 80.0–100.0)
Platelets: 266 10*3/uL (ref 150–400)
RBC: 4.5 MIL/uL (ref 3.87–5.11)
RDW: 13.8 % (ref 11.5–15.5)
WBC: 4.7 10*3/uL (ref 4.0–10.5)
nRBC: 0 % (ref 0.0–0.2)

## 2021-01-19 LAB — TROPONIN I (HIGH SENSITIVITY): Troponin I (High Sensitivity): 3 ng/L (ref <18)

## 2021-01-19 LAB — BASIC METABOLIC PANEL
Anion gap: 5 (ref 5–15)
BUN: 13 mg/dL (ref 8–23)
CO2: 29 mmol/L (ref 22–32)
Calcium: 8.7 mg/dL — ABNORMAL LOW (ref 8.9–10.3)
Chloride: 103 mmol/L (ref 98–111)
Creatinine, Ser: 0.99 mg/dL (ref 0.44–1.00)
GFR, Estimated: >60 mL/min (ref >60)
Glucose, Bld: 92 mg/dL (ref 70–99)
Potassium: 4.5 mmol/L (ref 3.5–5.1)
Sodium: 137 mmol/L (ref 135–145)

## 2021-01-19 NOTE — ED Triage Notes (Signed)
Pt here with CP that started this weekend. Pt states pain is central and radiates to her left arm and comes and goes. Pt stable on arrival to ED.

## 2021-01-19 NOTE — Telephone Encounter (Signed)
Pt called in c/o chest pain in the center of her chest with radiation down her left arm that started Saturday while at a softball game.   It has been coming and going over the weekend.   Not having pain presently but she is very concerned about her BP being 177/79 this morning.  "It makes me very nervous when my BP is that high".   "The top number is not usually high, it's the bottom number".  She has been taking extra atenolol at Dr. Ancil Boozer' instruction when her BP is elevated per pt.  I have referred her to the ED for chest pain per the protocol.   She is agreeable to going and is going to [facility].   She sees a cardiologist for shortness of breath when I asked why she sees a cardiologist.   She mentioned she has Lupus, fibromyalgia and scarring of her lungs.     I sent my notes to [facility] for Dr. Ancil Boozer for her information.    Reason for Disposition . Pain also in shoulder(s) or arm(s) or jaw (Exception: pain is clearly made worse by movement)  Answer Assessment - Initial Assessment Questions 1. LOCATION: "Where does it hurt?"       Having chest pain going down left arm.   155 BP yesterday   177/76 this morning.    Chest pain in middle of chest.   It comes and goes.   The whole weekend. 2. RADIATION: "Does the pain go anywhere else?" (e.g., into neck, jaw, arms, back)     I'm having pain in my left arm.   I have discomfort on right side of my head.  It's a headache.  No dizziness.   I've been resting for the headache.   I'm having a little shortness of breath.   I have a little dry cough 3. ONSET: "When did the chest pain begin?" (Minutes, hours or days)      Over the whole weekend.   I went to a softball game on Sat.   Sitting watching the game when chest pain started. Dr. Ancil Boozer changed my medication.   I'm taking an extra atenolol.   Yesterday I took one yesterday morning and one last night.    It was because my BP was high for me.    I also take  amlodipine.   I'm not sure if it's the amlodipine and atenolol that I'm supposed to take 2 of.    I called last month and she sent message to take an extra atenolol if my top BP number was high. 4. PATTERN "Does the pain come and go, or has it been constant since it started?"  "Does it get worse with exertion?"      It comes and goes. 5. DURATION: "How long does it last" (e.g., seconds, minutes, hours)     It lasts a few minutes and then goes away. 6. SEVERITY: "How bad is the pain?"  (e.g., Scale 1-10; mild, moderate, or severe)    - MILD (1-3): doesn't interfere with normal activities     - MODERATE (4-7): interferes with normal activities or awakens from sleep    - SEVERE (8-10): excruciating pain, unable to do any normal activities       Chest pain is 3 at it's worst   It's just a discomfort. 7. CARDIAC RISK FACTORS: "Do you have any history of heart problems or risk factors for heart disease?" (e.g., angina, prior  heart attack; diabetes, high blood pressure, high cholesterol, smoker, or strong family history of heart disease)     I saw heart dr recently and he did not seem concerned.   I have shortness of breath. 8. PULMONARY RISK FACTORS: "Do you have any history of lung disease?"  (e.g., blood clots in lung, asthma, emphysema, birth control pills)     I have lung issues.  I have scaring on my lungs.   I have a lung function test coming up.  9. CAUSE: "What do you think is causing the chest pain?"     No.   I've not done anything any different 10. OTHER SYMPTOMS: "Do you have any other symptoms?" (e.g., dizziness, nausea, vomiting, sweating, fever, difficulty breathing, cough)       Not dizzy, no weakness, no breaking out in a sweat, no recent illnesses.  I have allergies I take Loratadin.   11. PREGNANCY: "Is there any chance you are pregnant?" "When was your last menstrual period?"       N/A due to age  Protocols used: CHEST PAIN-A-AH

## 2021-01-19 NOTE — ED Notes (Signed)
Pt presents to ED with c/o of midsternum chest pain that started over the weekend. Pt states pain is currently a 3/4 and radiates to L arm. Pt states pain is intermittent. Pt states SOB but states "I am always SOB". Pt is A&Ox4. NAD noted. Pt reports hypertension at home.

## 2021-01-19 NOTE — ED Provider Notes (Signed)
[facility] Emergency Department Provider Note  Time seen: 9:10 AM  I have reviewed the triage vital signs and the nursing notes.   HISTORY  Chief Complaint Chest Pain   HPI Nicole Ballard is a 65 y.o. female with a past medical history anxiety, bipolar, CKD, diabetes, hypertension, hyperlipidemia, presents to the emergency department for chest pain.  According to the patient over the weekend she has been experiencing some intermittent mild "discomfort" to the center of her chest.  Patient states this morning her blood pressure is elevated in the 150s which concerned her so she came into the emergency department for evaluation.  Patient denies any nausea or diaphoresis.  States mild shortness of breath but chronic times years per patient.  Denies any new or worsening shortness of breath.  Denies any chest pain currently.   Past Medical History:  Diagnosis Date  . Anxiety   . Bipolar 1 disorder (Nicole Ballard)   . Chronic kidney disease   . Chronic pain   . Diabetes mellitus without complication (Nicole Ballard)   . Fibromyalgia   . GERD (gastroesophageal reflux disease)   . Hearing loss of right ear   . Hemifacial spasm   . Hyperlipidemia   . Hypertension   . Insomnia   . Lumbago   . Osteoarthritis   . Paresthesia   . Rhinitis   . Systemic lupus erythematosus (Nicole Ballard)   . Vitamin D deficiency     Patient Active Problem List   Diagnosis Date Noted  . Hyperglycemia 10/23/2017  . Vertigo 09/29/2016  . Bronchiolitis obliterans organizing pneumonia ([place]) 09/29/2016  . Neck pain, musculoskeletal 12/15/2015  . Abnormal CAT scan 04/11/2015  . Auditory impairment 04/11/2015  . Insomnia, persistent 04/11/2015  . Chronic kidney disease (CKD), stage I 04/11/2015  . Chronic nonmalignant pain 04/11/2015  . Chronic constipation 04/11/2015  . Dyslipidemia 04/11/2015  . Fibromyalgia 04/11/2015  . Gastro-esophageal reflux disease without esophagitis 04/11/2015  . Dysphonia  04/11/2015  . Low back pain 04/11/2015  . Eczema intertrigo 04/11/2015  . Keratosis pilaris 04/11/2015  . Chronic recurrent major depressive disorder (Nicole Ballard) 04/11/2015  . Dysmetabolic syndrome 25/95/6387  . Neurogenic claudication ([name]) 04/11/2015  . Perennial allergic rhinitis with seasonal variation 04/11/2015  . Abnormal presence of protein in urine 04/11/2015  . Seborrhea capitis 04/11/2015  . Moderate dysplasia of vulva 04/11/2015  . Vitamin D deficiency 04/11/2015  . Spinal stenosis of lumbar region 04/11/2015  . Treadmill stress test negative for angina pectoris 03/05/2015  . Shortness of breath 05/20/2014  . Asthma, chronic 12/31/2013  . H/O total knee replacement 12/31/2013  . Episcleritis 09/26/2013  . Vocal cord dysfunction 05/28/2013  . Anxiety, generalized 03/19/2013  . Back pain 12/18/2012  . Pulmonary nodules 11/26/2012  . Lung nodule, multiple 11/26/2012  . Arthritis, degenerative 11/01/2012  . H/O aspiration pneumonitis 10/22/2012  . Postinflammatory pulmonary fibrosis (Nicole Ballard) 10/22/2012  . Mixed incontinence 05/04/2012  . Lung involvement in systemic lupus erythematosus (Nicole Ballard) 11/05/2010  . Chronic nausea 07/01/2008  . Benign hypertension 01/25/2005    Past Surgical History:  Procedure Laterality Date  . BRAIN SURGERY    . CHOLECYSTECTOMY    . VAGINAL HYSTERECTOMY      Prior to Admission medications   Medication Sig Start Date End Date Taking? Authorizing Provider  amLODipine (NORVASC) 5 MG tablet TAKE 1 TABLET BY MOUTH  DAILY 11/29/20   Steele Sizer, MD  aspirin EC 81 MG tablet Take 1 tablet (81 mg total) by mouth daily. 10/31/17  Steele Sizer, MD  atenolol (TENORMIN) 25 MG tablet Take 1 tablet (25 mg total) by mouth daily. 12/14/20   Steele Sizer, MD  atorvastatin (LIPITOR) 40 MG tablet TAKE 1 TABLET BY MOUTH  DAILY 06/12/20   Steele Sizer, MD  Biotin 1 MG CAPS Take 1 capsule by mouth daily. 02/07/19   Steele Sizer, MD  buPROPion (WELLBUTRIN XL)  150 MG 24 hr tablet Take 150 mg by mouth every morning.    [provider]  Cholecalciferol (VITAMIN D) 2000 UNITS CAPS Take 1 capsule by mouth daily.    [provider]  clobetasol ointment (TEMOVATE) 0.05 % Apply topically. 04/24/20 04/24/21  [provider]  clonazePAM (KLONOPIN) 0.5 MG tablet Take 0.5 mg by mouth daily.    [provider]  clotrimazole-betamethasone (LOTRISONE) cream Apply 1 application topically 2 (two) times daily. 02/14/20   Steele Sizer, MD  cyclobenzaprine (FLEXERIL) 5 MG tablet TAKE 1 TABLET BY MOUTH  EVERY 8 HOURS AS NEEDED FOR MUSCLE SPASMS. DO NOT DRIVE FOR 8 HOURS [id number]   Steele Sizer, MD  diclofenac sodium (VOLTAREN) 1 % GEL APP 2 GRAMS EXT AA QID 04/17/15   [provider]  fluticasone (FLONASE) 50 MCG/ACT nasal spray Place 2 sprays into both nostrils daily. 11/20/18   Hubbard Hartshorn, FNP  gabapentin (NEURONTIN) 300 MG capsule Take 300 mg (1 capsule) in the morning and at 300 mg (1 capsule) at noon and 600 mg (2 capsules) at night. 07/06/20   [provider]  hydroxychloroquine (PLAQUENIL) 200 MG tablet Take by mouth 2 (two) times daily.    [provider]  lamoTRIgine (LAMICTAL) 200 MG tablet Take 200 mg by mouth 2 (two) times daily.    [provider]  loratadine (CLARITIN) 10 MG tablet Take 1 tablet (10 mg total) by mouth daily. 12/14/20   Steele Sizer, MD  meclizine (ANTIVERT) 25 MG tablet Take 1 tablet (25 mg total) by mouth 3 (three) times daily as needed. 10/09/19   Steele Sizer, MD  Melatonin 10 MG TABS Take 1 tablet by mouth at bedtime.    [provider]  mycophenolate (MYFORTIC) 360 MG TBEC EC tablet Take 360 mg by mouth 2 (two) times daily.    [provider]  olopatadine (PATANOL) 0.1 % ophthalmic solution Place 1 drop into both eyes 2 (two) times daily. 12/14/20   Steele Sizer, MD  omeprazole (PRILOSEC) 40 MG capsule TAKE 1 CAPSULE BY MOUTH  DAILY 03/08/20    Steele Sizer, MD  Polyethylene Glycol 3350 (MIRALAX PO) Take by mouth.    [provider]  telmisartan (MICARDIS) 80 MG tablet TAKE 1 TABLET BY MOUTH  DAILY 01/11/21   Steele Sizer, MD  traZODone (DESYREL) 100 MG tablet Take 150 mg by mouth at bedtime.    [provider]  triamcinolone ointment (KENALOG) 0.1 %  10/26/16   [provider]  venlafaxine XR (EFFEXOR-XR) 37.5 MG 24 hr capsule Take 37.5 mg by mouth 2 (two) times daily. 08/05/20   [provider]    Allergies  Allergen Reactions  . Ace Inhibitors     Other reaction(s): OTHER Pt states she can not take ace inhibitors. Pt states she can not remember her reaction.   . Bee Pollen   . Penicillins   . Pollen Extract     Family History  Problem Relation Age of Onset  . Breast cancer Mother   . Cancer Mother 49       Breast  .  Cancer Father 94       Stomach  . Breast cancer Maternal Aunt     Social History Social History   Tobacco Use  . Smoking status: Never Smoker  . Smokeless tobacco: Never Used  Vaping Use  . Vaping Use: Never used  Substance Use Topics  . Alcohol use: No    Alcohol/week: 0.0 standard drinks  . Drug use: No    Review of Systems Constitutional: Negative for fever Cardiovascular: Mild intermittent central chest discomfort Respiratory: Shortness of breath times years, chronic. Gastrointestinal: Negative for abdominal pain, vomiting Musculoskeletal: Negative for musculoskeletal complaints Neurological: Negative for headache All other ROS negative  ____________________________________________   PHYSICAL EXAM:  VITAL SIGNS: ED Triage Vitals  Enc Vitals Group     BP 01/19/21 0909 (!) 154/89     Pulse Rate 01/19/21 0909 (!) 58     Resp 01/19/21 0909 18     Temp 01/19/21 0909 99.2 F (37.3 C)     Temp Source 01/19/21 0909 Oral     SpO2 01/19/21 0909 98 %     Weight --      Height --      Head Circumference --      Peak Flow --      Pain Score  01/19/21 0906 3     Pain Loc --      Pain Edu? --      Excl. in [place]? --     Constitutional: Alert and oriented. Well appearing and in no distress. Eyes: Normal exam ENT      Head: Normocephalic and atraumatic.      Mouth/Throat: Mucous membranes are moist. Cardiovascular: Normal rate, regular rhythm.  Respiratory: Normal respiratory effort without tachypnea nor retractions. Breath sounds are clear  Gastrointestinal: Soft and nontender. No distention.   Musculoskeletal: Nontender with normal range of motion in all extremities.  Neurologic:  Normal speech and language. No gross focal neurologic deficits  Skin:  Skin is warm, dry and intact.  Psychiatric: Mood and affect are normal.   ____________________________________________    EKG  EKG viewed and interpreted by myself shows sinus bradycardia 56 bpm with a narrow QRS, normal axis, normal intervals, no concerning ST changes.  ____________________________________________    RADIOLOGY  Chest x-ray shows shallow inspiration with no edema or consolidation  ____________________________________________   INITIAL IMPRESSION / ASSESSMENT AND PLAN / ED COURSE  Pertinent labs & imaging results that were available during my care of the patient were reviewed by me and considered in my medical decision making (see chart for details).   Patient presents emergency department for chest pain intermittent over the weekend along with hypertension today.  Blood pressure currently 154/89.  Patient took her blood pressure medications this morning before coming to the emergency department.  Patient denies any chest pain currently.  Denies any nausea or diaphoresis.  Does state shortness of breath but states unchanged times years.  We will check labs including cardiac enzymes obtain a chest x-ray and EKG and continue to closely monitor.  Overall the patient appears well.  Patient's work-up is reassuring.  Chest x-ray is normal, lab work largely  within normal limits including a negative troponin.  Given the patient's reassuring physical exam and work-up I believe the patient is safe for discharge home with PCP follow-up.  Patient agreeable to plan of care.  VEDA ARRELLANO was evaluated in Emergency Department on 01/19/2021 for the symptoms described in the history of present illness. She was evaluated  in the context of the global COVID-19 pandemic, which necessitated consideration that the patient might be at risk for infection with the SARS-CoV-2 virus that causes COVID-19. Institutional protocols and algorithms that pertain to the evaluation of patients at risk for COVID-19 are in a state of rapid change based on information released by regulatory bodies including the CDC and federal and state organizations. These policies and algorithms were followed during the patient's care in the ED.  ____________________________________________   FINAL CLINICAL IMPRESSION(S) / ED DIAGNOSES Chest pain Hypertension Chest pain   Harvest Dark, MD 01/19/21 [phone]

## 2021-01-19 NOTE — ED Notes (Signed)
D/C and reasons to return to ED discussed with pt, pt verbalized understanding. NAD noted VSS.  ?

## 2021-01-19 NOTE — ED Notes (Signed)
Pt at XRAY

## 2021-01-26 ENCOUNTER — Telehealth: Payer: Self-pay | Admitting: Family Medicine

## 2021-01-26 NOTE — Telephone Encounter (Signed)
Spoke with patient and relayed advice per Dr. Ancil Boozer. She verbalized understanding and was in agreement with implementing suggestions.

## 2021-01-26 NOTE — Telephone Encounter (Signed)
Pt called about her BP last week and spoke with NT and went to [facility] / pt states her BP is running over the 150's and today is 156/87/ pt asked to speak with nurse about this / please advise today  Pt scheduled apt for 5.31.22 with cheryl

## 2021-01-28 NOTE — Unmapped (Signed)
patient has enough medication on hand, rescheduling refill call for 6/16

## 2021-01-29 ENCOUNTER — Other Ambulatory Visit: Payer: Self-pay

## 2021-01-29 ENCOUNTER — Ambulatory Visit: Payer: Self-pay | Admitting: *Deleted

## 2021-01-29 ENCOUNTER — Encounter: Payer: Self-pay | Admitting: Emergency Medicine

## 2021-01-29 ENCOUNTER — Emergency Department
Admission: EM | Admit: 2021-01-29 | Discharge: 2021-01-29 | Disposition: A | Discharge status: Home / Self Care | Payer: Medicare Other | Attending: Emergency Medicine | Admitting: Emergency Medicine

## 2021-01-29 ENCOUNTER — Emergency Department: Payer: Medicare Other

## 2021-01-29 DIAGNOSIS — E785 Hyperlipidemia, unspecified: Secondary | ICD-10-CM | POA: Insufficient documentation

## 2021-01-29 DIAGNOSIS — Z041 Encounter for examination and observation following transport accident: Secondary | ICD-10-CM | POA: Diagnosis not present

## 2021-01-29 DIAGNOSIS — Z7982 Long term (current) use of aspirin: Secondary | ICD-10-CM | POA: Insufficient documentation

## 2021-01-29 DIAGNOSIS — M26621 Arthralgia of right temporomandibular joint: Secondary | ICD-10-CM

## 2021-01-29 DIAGNOSIS — N181 Chronic kidney disease, stage 1: Secondary | ICD-10-CM | POA: Insufficient documentation

## 2021-01-29 DIAGNOSIS — Z79899 Other long term (current) drug therapy: Secondary | ICD-10-CM | POA: Insufficient documentation

## 2021-01-29 DIAGNOSIS — G44319 Acute post-traumatic headache, not intractable: Secondary | ICD-10-CM | POA: Diagnosis not present

## 2021-01-29 DIAGNOSIS — E1169 Type 2 diabetes mellitus with other specified complication: Secondary | ICD-10-CM | POA: Insufficient documentation

## 2021-01-29 DIAGNOSIS — S161XXA Strain of muscle, fascia and tendon at neck level, initial encounter: Secondary | ICD-10-CM | POA: Diagnosis not present

## 2021-01-29 DIAGNOSIS — Y92410 Unspecified street and highway as the place of occurrence of the external cause: Secondary | ICD-10-CM | POA: Diagnosis not present

## 2021-01-29 DIAGNOSIS — E1122 Type 2 diabetes mellitus with diabetic chronic kidney disease: Secondary | ICD-10-CM | POA: Diagnosis not present

## 2021-01-29 DIAGNOSIS — I129 Hypertensive chronic kidney disease with stage 1 through stage 4 chronic kidney disease, or unspecified chronic kidney disease: Secondary | ICD-10-CM | POA: Diagnosis not present

## 2021-01-29 DIAGNOSIS — R519 Headache, unspecified: Secondary | ICD-10-CM | POA: Diagnosis not present

## 2021-01-29 DIAGNOSIS — S199XXA Unspecified injury of neck, initial encounter: Secondary | ICD-10-CM | POA: Diagnosis present

## 2021-01-29 DIAGNOSIS — Z96659 Presence of unspecified artificial knee joint: Secondary | ICD-10-CM | POA: Diagnosis not present

## 2021-01-29 MED ORDER — HYDROCODONE-ACETAMINOPHEN 5-325 MG PO TABS: 1.0000 | ORAL_TABLET | Freq: Four times a day (QID) | ORAL | 0 refills | Status: DC | PRN

## 2021-01-29 MED ORDER — CYCLOBENZAPRINE HCL 5 MG PO TABS
5.0000 mg | ORAL_TABLET | Freq: Three times a day (TID) | ORAL | 0 refills | Status: DC | PRN
Start: 2021-01-29 — End: 2021-05-25
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
## 2021-01-29 NOTE — Discharge Instructions (Addendum)
Follow-up with your primary care provider if any continued problems.  2 prescriptions were sent to your pharmacy.  1 is for pain the other is the Flexeril that you have taken in the past.  Do not take these medications and plan to drive or operate machinery as it could cause drowsiness and increase your risk for injury.  You may also use ice or heat to your neck as needed for discomfort.  Even with medication you may be sore and stiff for the next 4 to 5 days.  Return to the emergency department over the holiday weekend if any severe worsening of your symptoms or urgent concerns.
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
## 2021-02-02 ENCOUNTER — Ambulatory Visit (INDEPENDENT_AMBULATORY_CARE_PROVIDER_SITE_OTHER): Payer: Medicare Other | Admitting: Unknown Physician Specialty

## 2021-02-02 ENCOUNTER — Encounter: Payer: Self-pay | Admitting: Unknown Physician Specialty

## 2021-02-02 ENCOUNTER — Other Ambulatory Visit: Payer: Self-pay

## 2021-02-04 ENCOUNTER — Ambulatory Visit (INDEPENDENT_AMBULATORY_CARE_PROVIDER_SITE_OTHER): Payer: Medicare Other | Admitting: Family Medicine

## 2021-02-04 ENCOUNTER — Encounter: Payer: Self-pay | Admitting: Family Medicine

## 2021-02-04 ENCOUNTER — Other Ambulatory Visit: Payer: Self-pay

## 2021-02-04 VITALS — BP 142/78 | HR 76 | Temp 98.7°F | Resp 16 | Ht 67.0 in | Wt 234.0 lb

## 2021-02-04 DIAGNOSIS — I998 Other disorder of circulatory system: Secondary | ICD-10-CM

## 2021-02-04 MED ORDER — HYDRALAZINE 10 MG TABLET
0 days
Start: 2021-02-04 — End: ?

## 2021-02-04 MED ORDER — AMLODIPINE BESYLATE 2.5 MG PO TABS: 7.5000 mg | ORAL_TABLET | Freq: Every day | ORAL | 0 refills | Status: DC

## 2021-02-04 MED ORDER — HYDRALAZINE HCL 10 MG PO TABS: 10.0000 mg | ORAL_TABLET | Freq: Three times a day (TID) | ORAL | 0 refills | Status: DC

## 2021-02-04 NOTE — Progress Notes (Signed)
Name: Nicole Ballard   MRN: [id number]    DOB: 04-18-1956   Date:02/04/2021       Progress Note  Subjective  Chief Complaint  BP Check  HPI  HTN:She has been taking norasc 5 mg, atenolol 25 mg and Telmisartan 80 mg daily, however a few weeks ago she noticed bp spiking at home, last Friday it stayed above 170/100 and nurse advisor told her to go to [facility] , it was also elevated over there. She states when bp reading is high , she has headaches and also feels very nervous and anxious. She brought her bp machine today and her reading was 13 points higher than ours. She states she found out her psychiatrist is leaving soon and she has been upset about it   INTERPRETATION( done 02/2019 ) Normal Stress Echocardiogram NORMAL RIGHT VENTRICULAR SYSTOLIC FUNCTION TRIVIAL REGURGITATION NOTED  NO VALVULAR STENOSIS NOTED    Patient Active Problem List   Diagnosis Date Noted  . Hyperglycemia 10/23/2017  . Vertigo 09/29/2016  . Bronchiolitis obliterans organizing pneumonia ([place]) 09/29/2016  . Neck pain, musculoskeletal 12/15/2015  . Abnormal CAT scan 04/11/2015  . Auditory impairment 04/11/2015  . Insomnia, persistent 04/11/2015  . Chronic kidney disease (CKD), stage I 04/11/2015  . Chronic nonmalignant pain 04/11/2015  . Chronic constipation 04/11/2015  . Dyslipidemia 04/11/2015  . Fibromyalgia 04/11/2015  . Gastro-esophageal reflux disease without esophagitis 04/11/2015  . Dysphonia 04/11/2015  . Low back pain 04/11/2015  . Eczema intertrigo 04/11/2015  . Keratosis pilaris 04/11/2015  . Chronic recurrent major depressive disorder ([place]) 04/11/2015  . Dysmetabolic syndrome 46/65/9935  . Neurogenic claudication ([place]) 04/11/2015  . Perennial allergic rhinitis with seasonal variation 04/11/2015  . Abnormal presence of protein in urine 04/11/2015  . Seborrhea capitis 04/11/2015  . Moderate dysplasia of vulva 04/11/2015  . Vitamin D deficiency 04/11/2015  . Spinal stenosis of lumbar  region 04/11/2015  . Treadmill stress test negative for angina pectoris 03/05/2015  . Shortness of breath 05/20/2014  . Asthma, chronic 12/31/2013  . H/O total knee replacement 12/31/2013  . Episcleritis 09/26/2013  . Vocal cord dysfunction 05/28/2013  . Anxiety, generalized 03/19/2013  . Back pain 12/18/2012  . Pulmonary nodules 11/26/2012  . Lung nodule, multiple 11/26/2012  . Arthritis, degenerative 11/01/2012  . H/O aspiration pneumonitis 10/22/2012  . Postinflammatory pulmonary fibrosis ([place]) 10/22/2012  . Mixed incontinence 05/04/2012  . Lung involvement in systemic lupus erythematosus ([place]) 11/05/2010  . Chronic nausea 07/01/2008  . Benign hypertension 01/25/2005    Past Surgical History:  Procedure Laterality Date  . BRAIN SURGERY    . CHOLECYSTECTOMY    . VAGINAL HYSTERECTOMY      Family History  Problem Relation Age of Onset  . Breast cancer Mother   . Cancer Mother 76       Breast  . Cancer Father 43       Stomach  . Breast cancer Maternal Aunt     Social History   Tobacco Use  . Smoking status: Never Smoker  . Smokeless tobacco: Never Used  Substance Use Topics  . Alcohol use: No    Alcohol/week: 0.0 standard drinks     Current Outpatient Medications:  .  amLODipine (NORVASC) 5 MG tablet, TAKE 1 TABLET BY MOUTH  DAILY, Disp: 90 tablet, Rfl: 1 .  aspirin EC 81 MG tablet, Take 1 tablet (81 mg total) by mouth daily., Disp: 30 tablet, Rfl: 0 .  atenolol (TENORMIN) 25 MG tablet, Take 1 tablet (25 mg  total) by mouth daily., Disp: 90 tablet, Rfl: 3 .  atorvastatin (LIPITOR) 40 MG tablet, TAKE 1 TABLET BY MOUTH  DAILY, Disp: 90 tablet, Rfl: 3 .  Biotin 1 MG CAPS, Take 1 capsule by mouth daily., Disp: 30 capsule, Rfl: 0 .  buPROPion (WELLBUTRIN XL) 150 MG 24 hr tablet, Take 150 mg by mouth every morning., Disp: , Rfl:  .  Cholecalciferol (VITAMIN D) 2000 UNITS CAPS, Take 1 capsule by mouth daily., Disp: , Rfl:  .  clobetasol ointment (TEMOVATE) 0.05 %, Apply  topically., Disp: , Rfl:  .  clonazePAM (KLONOPIN) 0.5 MG tablet, Take 0.5 mg by mouth daily., Disp: , Rfl:  .  clotrimazole-betamethasone (LOTRISONE) cream, Apply 1 application topically 2 (two) times daily., Disp: 45 g, Rfl: 0 .  cyclobenzaprine (FLEXERIL) 5 MG tablet, Take 1 tablet (5 mg total) by mouth 3 (three) times daily as needed for muscle spasms., Disp: 12 tablet, Rfl: 0 .  diclofenac sodium (VOLTAREN) 1 % GEL, APP 2 GRAMS EXT AA QID, Disp: , Rfl: 6 .  fluticasone (FLONASE) 50 MCG/ACT nasal spray, Place 2 sprays into both nostrils daily., Disp: 48 g, Rfl: 3 .  gabapentin (NEURONTIN) 300 MG capsule, Take 300 mg (1 capsule) in the morning and at 300 mg (1 capsule) at noon and 600 mg (2 capsules) at night., Disp: , Rfl:  .  HYDROcodone-acetaminophen (NORCO/VICODIN) 5-325 MG tablet, Take 1 tablet by mouth every 6 (six) hours as needed for moderate pain., Disp: 15 tablet, Rfl: 0 .  hydroxychloroquine (PLAQUENIL) 200 MG tablet, Take by mouth 2 (two) times daily., Disp: , Rfl:  .  lamoTRIgine (LAMICTAL) 200 MG tablet, Take 200 mg by mouth 2 (two) times daily., Disp: , Rfl:  .  loratadine (CLARITIN) 10 MG tablet, Take 1 tablet (10 mg total) by mouth daily., Disp: 90 tablet, Rfl: 1 .  Melatonin 10 MG TABS, Take 1 tablet by mouth at bedtime., Disp: , Rfl:  .  mycophenolate (MYFORTIC) 360 MG TBEC EC tablet, Take 360 mg by mouth 2 (two) times daily., Disp: , Rfl:  .  olopatadine (PATANOL) 0.1 % ophthalmic solution, Place 1 drop into both eyes 2 (two) times daily., Disp: 5 mL, Rfl: 1 .  omeprazole (PRILOSEC) 40 MG capsule, TAKE 1 CAPSULE BY MOUTH  DAILY, Disp: 90 capsule, Rfl: 3 .  Polyethylene Glycol 3350 (MIRALAX PO), Take by mouth., Disp: , Rfl:  .  pregabalin (LYRICA) 50 MG capsule, , Disp: , Rfl:  .  telmisartan (MICARDIS) 80 MG tablet, TAKE 1 TABLET BY MOUTH  DAILY, Disp: 90 tablet, Rfl: 0 .  traZODone (DESYREL) 100 MG tablet, Take 150 mg by mouth at bedtime., Disp: , Rfl:  .  triamcinolone  ointment (KENALOG) 0.1 %, , Disp: , Rfl:  .  venlafaxine XR (EFFEXOR-XR) 37.5 MG 24 hr capsule, Take 37.5 mg by mouth 2 (two) times daily., Disp: , Rfl:   Allergies  Allergen Reactions  . Ace Inhibitors     Other reaction(s): OTHER Pt states she can not take ace inhibitors. Pt states she can not remember her reaction.   . Bee Pollen   . Penicillins   . Pollen Extract     I personally reviewed active problem list, medication list, allergies, family history, social history, health maintenance, notes from last encounter with the patient/caregiver today.   ROS  Ten systems reviewed and is negative except as mentioned in HPI   Objective  Vitals:   02/04/21 1003  BP: 134/88  Pulse: 76  Resp: 16  Temp: 98.7 F (37.1 C)  TempSrc: Oral  SpO2: 94%  Weight: 234 lb (106.1 kg)  Height: 5\' 7"  (1.702 m)    Body mass index is 36.65 kg/m.  Physical Exam  Constitutional: Patient appears well-developed and well-nourished. Obese  No distress.  HEENT: head atraumatic, normocephalic, pupils equal and reactive to light,  neck supple Cardiovascular: Normal rate, regular rhythm and normal heart sounds.  No murmur heard. No BLE edema. Pulmonary/Chest: Effort normal and breath sounds normal. No respiratory distress. Abdominal: Soft.  There is no tenderness. Psychiatric: Patient has a normal mood and affect. behavior is normal. Judgment and thought content normal.  Recent Results (from the past 2160 hour(s))  POCT HgB A1C     Status: Abnormal   Collection Time: 12/14/20  8:05 AM  Result Value Ref Range   Hemoglobin A1C 5.7 (A) 4.0 - 5.6 %   HbA1c POC (<> result, manual entry)     HbA1c, POC (prediabetic range)     HbA1c, POC (controlled diabetic range)    Basic metabolic panel     Status: Abnormal   Collection Time: 01/19/21  9:08 AM  Result Value Ref Range   Sodium 137 135 - 145 mmol/L   Potassium 4.5 3.5 - 5.1 mmol/L   Chloride 103 98 - 111 mmol/L   CO2 29 22 - 32 mmol/L    Glucose, Bld 92 70 - 99 mg/dL    Comment: Glucose reference range applies only to samples taken after fasting for at least 8 hours.   BUN 13 8 - 23 mg/dL   Creatinine, Ser 0.99 0.44 - 1.00 mg/dL   Calcium 8.7 (L) 8.9 - 10.3 mg/dL   GFR, Estimated >60 >60 mL/min    Comment: (NOTE) Calculated using the CKD-EPI Creatinine Equation (2021)    Anion gap 5 5 - 15    Comment: Performed at [facility], [address] [place], [place] [address]  CBC     Status: None   Collection Time: 01/19/21  9:08 AM  Result Value Ref Range   WBC 4.7 4.0 - 10.5 K/uL   RBC 4.50 3.87 - 5.11 MIL/uL   Hemoglobin 12.3 12.0 - 15.0 g/dL   HCT 39.1 36.0 - 46.0 %   MCV 86.9 80.0 - 100.0 fL   MCH 27.3 26.0 - 34.0 pg   MCHC 31.5 30.0 - 36.0 g/dL   RDW 13.8 11.5 - 15.5 %   Platelets 266 150 - 400 K/uL   nRBC 0.0 0.0 - 0.2 %    Comment: Performed at [facility], [place], [place] [address]  Troponin I (High Sensitivity)     Status: None   Collection Time: 01/19/21  9:08 AM  Result Value Ref Range   Troponin I (High Sensitivity) 3 <18 ng/L    Comment: (NOTE) Elevated high sensitivity troponin I (hsTnI) values and significant  changes across serial measurements may suggest ACS but many other  chronic and acute conditions are known to elevate hsTnI results.  Refer to the "Links" section for chest pain algorithms and additional  guidance. Performed at [facility], [address] [place], [place] [address]      PHQ2/9: Depression screen [facility] 2/9 02/04/2021 02/02/2021 12/14/2020 08/13/2020 06/15/2020  Decreased Interest 1 3 1 1 2   Down, Depressed, Hopeless 1 2 1 1 2   PHQ - 2 Score 2 5 2 2 4   Altered sleeping 0 1 1 0 0  Tired, decreased energy 0 1 3 1 2   Change in appetite - 0 3 1 3   Feeling bad or failure about yourself  0 0 1 0 1  Trouble concentrating 1 0 1 0 0  Moving slowly or fidgety/restless 0 0 0 1 0  Suicidal thoughts 0 0 0 0 0  PHQ-9 Score 3 7 11 5 10    Difficult doing work/chores - Not difficult at all - Somewhat difficult Somewhat difficult  Some recent data might be hidden    phq 9 is positive   Fall Risk: Fall Risk  02/04/2021 02/02/2021 12/14/2020 08/13/2020 06/15/2020  Falls in the past year? 0 0 0 0 0  Number falls in past yr: 0 0 0 0 0  Comment - - - - -  Injury with Fall? 0 0 0 0 0  Risk for fall due to : - - - No Fall Risks -  Risk for fall due to: Comment - - - - -  Follow up - - - Falls prevention discussed -     Functional Status Survey: Is the patient deaf or have difficulty hearing?: Yes Does the patient have difficulty seeing, even when wearing glasses/contacts?: No Does the patient have difficulty concentrating, remembering, or making decisions?: Yes Does the patient have difficulty walking or climbing stairs?: Yes Does the patient have difficulty dressing or bathing?: No Does the patient have difficulty doing errands alone such as visiting a doctor's office or shopping?: No    Assessment & Plan  1. Fluctuating blood pressure  - hydrALAZINE (APRESOLINE) 10 MG tablet; Take 1 tablet (10 mg total) by mouth 3 (three) times daily. Prn for bp above 150/90  Dispense: 30 tablet; Refill: 0  Advised to contact cardiologist for follow up  We will adjust dose of Norvasc to 7.5 mg daily Also giving hydralazine to take one pill prn bp stays above 150/90 and get a new bp monitor with wrist cuff instead of arm ( seems too small for her )

## 2021-02-04 NOTE — Unmapped (Unsigned)
Otolaryngology Return Voice Visit        History of Present Illness:  Ms.Steph QUIANNA AVERY is a 65 y.o.  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   ??  At the end of our previous clinic visit we discussed and recommended clinical course of Respiratory Retraining, Biotene mouthwash, D/C Listerine- switch to salt water gargles   ??  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  ??  December 2018:??The patient notes that her symptoms have been relatively stable. ??She continues to complain of burning tongue.????Following this visit I recommended a thyroid hormone panel and thyroid ultrasound.  ??  August 2019:??The patient reports that she continues to clear there throat and cough in a consistent manner. ??Thyroid ultrasound was unremarkable w/o nodules. She occasionally (rarely ) awakens from sleep with cough. She has done a few sessions with SLP, but claims to have forgotten the techniques.  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  I additionally offered behavioral intervention for her cough symptom.  ??  April 2021: In the interim the patient is followed up with neurosurgery and has had radiography demonstrating no additional lesions.  The patient did however complain of right-sided head throbbing, and otalgia.  Ms. Spragg notes that she has continued to have balance difficulty, intermittent cough and dysphonia.  Lastly, she reports that her throat has been burning more than in the past though she has not seen her GI physician for some time.  The patient reports that she has had ongoing right otalgia that she is treated with hydrogen peroxide.  She has had her ears flushed by her primary care physician without resolution.  The patient was referred to otology for right otalgia.    June 2022:  ??        Interval Surgery/Medical History:    Interval  Past Medical History and ROS is otherwise unchanged except as per HPI      Medications:    Current Outpatient Medications:   ???  amLODIPine (NORVASC) 5 MG tablet, Take 1 tablet (5 mg total) by mouth daily., Disp: 90 tablet, Rfl: 3  ???  aspirin (ECOTRIN) 81 MG tablet, Take 81 mg by mouth daily., Disp: , Rfl:   ???  atenoloL (TENORMIN) 25 MG tablet, Take 25 mg by mouth daily., Disp: , Rfl:   ???  atorvastatin (LIPITOR) 40 MG tablet, Take 40 mg by mouth daily., Disp: , Rfl:   ???  biotin 1 mg cap, Take by mouth., Disp: , Rfl:   ???  buPROPion (WELLBUTRIN XL) 150 MG 24 hr tablet, Take 1 tablet (150 mg total) by mouth every morning., Disp: 90 tablet, Rfl: 1  ???  calcipotriene (DOVONOX) 0.005 % ointment, Apply every other day to areas of rash in folds as maintenance, Disp: 60 g, Rfl: 2  ???  clobetasoL (TEMOVATE) 0.05 % ointment, Apply topically nightly., Disp: 45 g, Rfl: 3  ???  clonazePAM (KLONOPIN) 0.5 MG tablet, Take 0.5 tablets (0.25 mg total) by mouth nightly., Disp: 15 tablet, Rfl: 2  ???  clotrimazole-betamethasone (LOTRISONE) 1-0.05 % cream, Apply 1 application topically Two (2) times a day., Disp: , Rfl:   ???  cyclobenzaprine (  FLEXERIL) 5 MG tablet, Take 1 tablet (5 mg total) by mouth two (2) times a day as needed., Disp: 60 tablet, Rfl: 2  ???  diclofenac sodium (VOLTAREN) 1 % gel, Apply 2 g topically two (2) times a day as needed for arthritis., Disp: 300 g, Rfl: 5  ???  dicyclomine (BENTYL) 20 mg tablet, Take 20 mg by mouth every six (6) hours., Disp: , Rfl:   ???  FLOVENT HFA 110 mcg/actuation inhaler, USE 2 PUFFS BY MOUTH TWO  TIMES DAILY, Disp: 36 g, Rfl: 3  ???  fluticasone propionate (FLONASE) 50 mcg/actuation nasal spray, , Disp: , Rfl:   ???  gabapentin (NEURONTIN) 300 MG capsule, Take 2 capsules (600 mg total) by mouth Three (3) times a day., Disp: 360 capsule, Rfl: 2  ???  hydrOXYchloroQUINE (PLAQUENIL) 200 mg tablet, Take 1 tablet (200 mg total) by mouth Two (2) times a day., Disp: 180 tablet, Rfl: 3  ???  inhalational spacing device (AEROCHAMBER MV) Spcr, 1 each by Miscellaneous route two (2) times a day. With [employer], Disp: 1 each, Rfl: 0  ???  ketoconazole (NIZORAL) 2 % cream, APP EXT AA D, Disp: , Rfl: 0  ???  lamoTRIgine (LAMICTAL) 200 MG tablet, Take 1 tablet (200 mg total) by mouth Two (2) times a day., Disp: 180 tablet, Rfl: 1  ???  meclizine (ANTIVERT) 25 mg tablet, Take 25 mg by mouth Three (3) times a day as needed. , Disp: , Rfl:   ???  melatonin 10 mg Tab, Take 1 tablet by mouth., Disp: , Rfl:   ???  metroNIDAZOLE (METROGEL) 0.75 % gel, Apply one applicatorful (~37.5 mg metronidazole) intravaginally once daily for 5 days., Disp: 45 g, Rfl: 0  ???  mupirocin (BACTROBAN) 2 % ointment, APP EXT IEN BID, Disp: , Rfl: 0  ???  mycophenolate (MYFORTIC) 360 MG TbEC, Take 1 tablet by mouth twice daily, Disp: 180 tablet, Rfl: 3  ???  omeprazole (PRILOSEC) 40 MG capsule, Take 40 mg by mouth daily. , Disp: , Rfl:   ???  ondansetron (ZOFRAN) 4 MG tablet, , Disp: , Rfl:   ???  polyethylene glycol (GLYCOLAX) 17 gram/dose powder, 2 cap fulls in a full glass of water, three times a day, for 5 days., Disp: , Rfl:   ???  telmisartan (MICARDIS) 80 MG tablet, Take 80 mg by mouth., Disp: , Rfl:   ???  traZODone (DESYREL) 100 MG tablet, Take 1.5 tablets (150 mg total) by mouth nightly for 90 doses., Disp: 135 tablet, Rfl: 1  ???  triamcinolone (KENALOG) 0.1 % ointment, Apply twice a day to affected areas when rough, otherwise just on the weekends, Disp: 454 g, Rfl: 1  ???  venlafaxine (EFFEXOR-XR) 37.5 MG 24 hr capsule, Take 2 capsules (75 mg total) by mouth daily., Disp: 180 capsule, Rfl: 1         Pertinent ROS as above and per HPI.     Physical Exam:   Constitutional:  Vitals reviewed on nursing chart.  Voice: ***  Respiration: Breathing comfortably, no stridor.   Ears:  Normal tympanic membranes to otoscopy,   Nose:  External nose midline, anterior rhinoscopy is normal with limited visualization just to the anterior interior turbinate.   Oral Cavity/Oropharynx/Lips:  Normal mucous membranes, normal floor of mouth/tongue/oropharynx, no masses or lesions are noted.    Pharyngeal Walls:  No masses noted.  Neck/Lymph:  No lymphadenopathy, no thyroid masses.        Procedure: 02/09/2021 ***  VRQOL: ***  GFI: ***      Assessment:   65 y.o.  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   ??  At the end of our previous clinic visit we discussed and recommended clinical course of Respiratory Retraining, Biotene mouthwash, D/C Listerine- switch to salt water gargles   ??  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  ??  December 2018:??The patient notes that her symptoms have been relatively stable. ??She continues to complain of burning tongue.????Following this visit I recommended a thyroid hormone panel and thyroid ultrasound.  ??  August 2019:??The patient reports that she continues to clear there throat and cough in a consistent manner. ??Thyroid ultrasound was unremarkable w/o nodules. She occasionally (rarely ) awakens from sleep with cough. She has done a few sessions with SLP, but claims to have forgotten the techniques.  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  I additionally offered behavioral intervention for her cough symptom.  ??  April 2021: In the interim the patient is followed up with neurosurgery and has had radiography demonstrating no additional lesions.  The patient did however complain of right-sided head throbbing, and otalgia.  Ms. Sigel notes that she has continued to have balance difficulty, intermittent cough and dysphonia.  Lastly, she reports that her throat has been burning more than in the past though she has not seen her GI physician for some time.  The patient reports that she has had ongoing right otalgia that she is treated with hydrogen peroxide.  She has had her ears flushed by her primary care physician without resolution.  The patient was referred to otology for right otalgia.    June 2022:    1. Slightly enlarged right Thyroid gland/ no dominant nodule  2. ??Burning mouth syndrome??   3.  Cough    Plan:      The patient in agreement the plan as articulated above.          This note was created with [employer] and may have errors that were not dictated and not seen in editing.     Elana Alm  Spyros Winch

## 2021-02-05 NOTE — Unmapped (Addendum)
Main clinic: [phone]  Office fax #: [phone]    For appointments call [phone] option 2    Nursing questions contact Dr. Atha Starks nurse, Guss Bunde LPN  at [phone]    For surgical scheduling, call [name] Alar [phone]    Emergency after hours, please call [phone] and ask to speak with the ENT physician on call.                 REFLUX INFORMATION SHEET      What is GERD?   Gastroesophageal reflux (GERD) occurs when acid from the stomach backs up into the esophagus (food pipe).  The lower esophageal sphincter (LES) is a ring of muscle at the bottom of esophagus that contracts to prevent stomach contents from refluxing back into the esophagus.  In patients with GERD, the LES may not be functioning properly and the stomach acid backs up into the esophagus and can damage the lining.    What is LPR?   Laryngopharyngeal reflux or reflux laryngitis occurs when the contents of the stomach reflux beyond the esophagus and into the back of the throat (larynx and pharynx) and possibly the back of the nasal airway.     **GERD and LPR can occur together; patients can also have GERD without LPR or LPR without GERD. 50% of patients with LPR have no heartburn.     Comparing GERD and LPR:      Gastroesophageal reflux/GERD Laryngopharyngeal reflux/LPR   Symptoms  Heartburn, regurgitation, nausea, trouble swallowing, chest pain, dry cough, bad breath Hoarseness, vocal fatigue, throat clearing, cough, swallowing difficulties, nasal drainage, feel a ???lump in the throat???, ???phlegm???, bitter taste, and sometimes trouble breathing   Findings that may be found on diagnostic testing Esophagitis, lower esophageal dysfunction Laryngeal inflammation, upper esophageal dysfunction   Complications that can occur Change in esophageal lining called Barrett???s esophagus that has a small chance of turning into cancer Vocal disorders including vocal nodules and contact granulomas, sinus and ear infections   Pattern of reflux Occurs when lying flat and often after meals Can occur when upright/day   Treatment Often treated by gastroenterologist; treatment can include: PPI (proton pump inhibitor), H2 blocker, diet and lifestyle changes Often treated by an ENT,  treatment can include: PPI (proton pump inhibitor), H2 blocker, diet and lifestyle changes            Division of Voice & Swallowing Disorders  Department of Otolaryngology-Head & Neck Surgery  [facility]  [email or web address]  ~  Clinic Phone:  [phone]  Clinic Fax:  [phone]    Coping with Reflux = Diet and Lifestyle Changes    Avoid foods that commonly aggravate symptoms:  spicy, tomato-based, fatty, chocolate, citrus fruits, fruit juices, coffee, tea, alcohol, soft drinks with carbonation.           Watch your weight???being overweight increased abdominal pressure and increases reflux.       Eat small meals, do NOT over eat.      Wait 2-4 hours after eating before exercise OR before lying down.       Elevate head of bed with blocks.      Stop smoking.   Take reflux medication, if directed to do so by your physician.   PPI/proton pump inhibitors (reduces acid production by the stomach); should be taken 30-60 min before meals.  Do not take within 2 hours of Synthroid/levothyroxine.    Examples:  Prilosec/omeprazole, Prevacid/lansoprazole, Nexium/Esomeprazole, Dexilant/dexlansoprazole.  Risks/side effects include but not  limited to: headache, nausea, abdominal pain, possible changes in mineral/vitamin absorption, or very rare cases of acute kidney problems.  H2 blockers (decreases stomach secretion of acid), often taken at bedtime.   Examples: Zantac/ranitidine, Pepcid/famotidine, Tagamet/cimetidine.  Risks/side effects include but not limited to: dizziness, nausea, rarely confusion, abnormal heart rhythm

## 2021-02-08 MED ORDER — CYCLOBENZAPRINE 5 MG TABLET
ORAL_TABLET | Freq: Two times a day (BID) | ORAL | 2 refills | 30 days | PRN
Start: 2021-02-08 — End: ?

## 2021-02-09 ENCOUNTER — Ambulatory Visit: Admit: 2021-02-09 | Discharge: 2021-02-09 | Payer: MEDICARE | Attending: Otolaryngology | Primary: Otolaryngology

## 2021-02-09 ENCOUNTER — Encounter: Admit: 2021-02-09 | Discharge: 2021-02-09 | Payer: MEDICARE

## 2021-02-09 DIAGNOSIS — G6289 Other specified polyneuropathies: Secondary | ICD-10-CM | POA: Diagnosis not present

## 2021-02-09 DIAGNOSIS — Z7952 Long term (current) use of systemic steroids: Secondary | ICD-10-CM | POA: Diagnosis not present

## 2021-02-09 DIAGNOSIS — G894 Chronic pain syndrome: Secondary | ICD-10-CM | POA: Diagnosis not present

## 2021-02-09 DIAGNOSIS — G8929 Other chronic pain: Secondary | ICD-10-CM | POA: Diagnosis not present

## 2021-02-09 DIAGNOSIS — M545 Chronic midline low back pain: Principal | ICD-10-CM

## 2021-02-09 DIAGNOSIS — M48061 Spinal stenosis, lumbar region without neurogenic claudication: Secondary | ICD-10-CM | POA: Diagnosis not present

## 2021-02-09 DIAGNOSIS — M797 Fibromyalgia: Secondary | ICD-10-CM | POA: Diagnosis not present

## 2021-02-09 DIAGNOSIS — M8949 Other hypertrophic osteoarthropathy, multiple sites: Secondary | ICD-10-CM | POA: Diagnosis not present

## 2021-02-09 DIAGNOSIS — M542 Cervicalgia: Secondary | ICD-10-CM | POA: Diagnosis not present

## 2021-02-09 DIAGNOSIS — R490 Dysphonia: Secondary | ICD-10-CM | POA: Diagnosis not present

## 2021-02-09 MED ORDER — CYCLOBENZAPRINE 5 MG TABLET
ORAL_TABLET | Freq: Two times a day (BID) | ORAL | 2 refills | 30 days | Status: CP | PRN
Start: 2021-02-09 — End: ?

## 2021-02-09 MED ORDER — DICLOFENAC 1 % TOPICAL GEL
Freq: Two times a day (BID) | TOPICAL | 5 refills | 75.00000 days | Status: CP | PRN
Start: 2021-02-09 — End: ?

## 2021-02-09 MED ORDER — FAMOTIDINE 20 MG TABLET
ORAL_TABLET | Freq: Two times a day (BID) | ORAL | 1 refills | 30 days | Status: CP
Start: 2021-02-09 — End: 2021-04-10

## 2021-02-09 MED ORDER — GABAPENTIN 300 MG CAPSULE
ORAL_CAPSULE | Freq: Three times a day (TID) | ORAL | 2 refills | 60.00000 days | Status: CN
Start: 2021-02-09 — End: 2021-04-10

## 2021-02-09 NOTE — Unmapped (Signed)
Otolaryngology Return Voice Visit        History of Present Illness:  Caitlin Smith is a 65 y.o.  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   ??  At the end of our previous clinic visit we discussed and recommended clinical course of Respiratory Retraining, Biotene mouthwash, D/C Listerine- switch to salt water gargles   ??  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  ??  December 2018:??The patient notes that her symptoms have been relatively stable. ??She continues to complain of burning tongue.????Following this visit I recommended a thyroid hormone panel and thyroid ultrasound.  ??  August 2019:??The patient reports that she continues to clear there throat and cough in a consistent manner. ??Thyroid ultrasound was unremarkable w/o nodules. She occasionally (rarely ) awakens from sleep with cough. She has done a few sessions with SLP, but claims to have forgotten the techniques.  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  I additionally offered behavioral intervention for her cough symptom.  ??  April 2021: In the interim the patient is followed up with neurosurgery and has had radiography demonstrating no additional lesions.  The patient did however complain of right-sided head throbbing, and otalgia.  [name] notes that she has continued to have balance difficulty, intermittent cough and dysphonia.  Lastly, she reports that her throat has been burning more than in the past though she has not seen her GI physician for some time.  The patient reports that she has had ongoing right otalgia that she is treated with hydrogen peroxide.  She has had her ears flushed by her primary care physician without resolution.  The patient was referred to otology for right otalgia.    June 2022: Patient returns with concerns for weak vocal projection, worsened over the past several weeks. She attributes this to her LPR, but has been taking her daily Prilosec with minimal relief. Otherwise, she has been doing well, without any additional concerns.     Interval Surgery/Medical History:    Interval Past Medical History and ROS is otherwise unchanged except as per HPI      Medications:    Current Outpatient Medications:   ???  amLODIPine (NORVASC) 5 MG tablet, Take 1 tablet (5 mg total) by mouth daily., Disp: 90 tablet, Rfl: 3  ???  aspirin (ECOTRIN) 81 MG tablet, Take 81 mg by mouth daily., Disp: , Rfl:   ???  atenoloL (TENORMIN) 25 MG tablet, Take 25 mg by mouth daily., Disp: , Rfl:   ???  atorvastatin (LIPITOR) 40 MG tablet, Take 40 mg by mouth daily., Disp: , Rfl:   ???  biotin 1 mg cap, Take by mouth., Disp: , Rfl:   ???  buPROPion (WELLBUTRIN XL) 150 MG 24 hr tablet, Take 1 tablet (150 mg total) by mouth every morning., Disp: 90 tablet, Rfl: 1  ???  calcipotriene (DOVONOX) 0.005 % ointment, Apply every other day to areas of rash in folds as maintenance, Disp: 60 g, Rfl: 2  ???  clobetasoL (TEMOVATE) 0.05 % ointment, Apply topically nightly., Disp: 45 g, Rfl: 3  ???  clonazePAM (KLONOPIN) 0.5 MG tablet, Take 0.5 tablets (0.25 mg  total) by mouth nightly., Disp: 15 tablet, Rfl: 2  ???  clotrimazole-betamethasone (LOTRISONE) 1-0.05 % cream, Apply 1 application topically Two (2) times a day., Disp: , Rfl:   ???  cyclobenzaprine (FLEXERIL) 5 MG tablet, Take 1 tablet (5 mg total) by mouth two (2) times a day as needed., Disp: 60 tablet, Rfl: 2  ???  diclofenac sodium (VOLTAREN) 1 % gel, Apply 2 g topically two (2) times a day as needed for arthritis., Disp: 300 g, Rfl: 5  ???  FLOVENT HFA 110 mcg/actuation inhaler, USE 2 PUFFS BY MOUTH TWO TIMES DAILY, Disp: 36 g, Rfl: 3  ???  fluticasone propionate (FLONASE) 50 mcg/actuation nasal spray, , Disp: , Rfl:   ???  gabapentin (NEURONTIN) 300 MG capsule, Take 2 capsules (600 mg total) by mouth Three (3) times a day., Disp: 360 capsule, Rfl: 2  ???  hydrALAZINE (APRESOLINE) 10 MG tablet, , Disp: , Rfl:   ???  hydrOXYchloroQUINE (PLAQUENIL) 200 mg tablet, Take 1 tablet (200 mg total) by mouth Two (2) times a day., Disp: 180 tablet, Rfl: 3  ???  melatonin 10 mg Tab, Take 1 tablet by mouth., Disp: , Rfl:   ???  pregabalin (LYRICA) 50 MG capsule, , Disp: , Rfl:   ???  sucralfate (CARAFATE) 1 gram tablet, , Disp: , Rfl:   ???  telmisartan (MICARDIS) 80 MG tablet, Take 80 mg by mouth., Disp: , Rfl:   ???  traZODone (DESYREL) 100 MG tablet, Take 1.5 tablets (150 mg total) by mouth nightly for 90 doses., Disp: 135 tablet, Rfl: 1  ???  triamcinolone (KENALOG) 0.1 % ointment, Apply twice a day to affected areas when rough, otherwise just on the weekends, Disp: 454 g, Rfl: 1  ???  venlafaxine (EFFEXOR-XR) 37.5 MG 24 hr capsule, Take 2 capsules (75 mg total) by mouth daily., Disp: 180 capsule, Rfl: 1  ???  dicyclomine (BENTYL) 20 mg tablet, Take 20 mg by mouth every six (6) hours., Disp: , Rfl:   ???  famotidine (PEPCID) 20 MG tablet, Take 1 tablet (20 mg total) by mouth Two (2) times a day., Disp: 60 tablet, Rfl: 1  ???  inhalational spacing device (AEROCHAMBER MV) Spcr, 1 each by Miscellaneous route two (2) times a day. With [employer], Disp: 1 each, Rfl: 0  ???  ketoconazole (NIZORAL) 2 % cream, APP EXT AA D, Disp: , Rfl: 0  ???  lamoTRIgine (LAMICTAL) 200 MG tablet, Take 1 tablet (200 mg total) by mouth Two (2) times a day., Disp: 180 tablet, Rfl: 1  ???  meclizine (ANTIVERT) 25 mg tablet, Take 25 mg by mouth Three (3) times a day as needed. , Disp: , Rfl:   ???  metroNIDAZOLE (METROGEL) 0.75 % gel, Apply one applicatorful (~37.5 mg metronidazole) intravaginally once daily for 5 days., Disp: 45 g, Rfl: 0  ???  mupirocin (BACTROBAN) 2 % ointment, APP EXT IEN BID, Disp: , Rfl: 0  ???  mycophenolate (MYFORTIC) 360 MG TbEC, Take 1 tablet by mouth twice daily, Disp: 180 tablet, Rfl: 3  ???  omeprazole (PRILOSEC) 40 MG capsule, Take 40 mg by mouth daily. , Disp: , Rfl:   ???  ondansetron (ZOFRAN) 4 MG tablet, , Disp: , Rfl:   ???  polyethylene glycol (GLYCOLAX) 17 gram/dose powder, 2 cap fulls in a full glass of water, three times a day, for 5 days., Disp: , Rfl:          Pertinent ROS as above and per HPI.  Physical Exam:   Constitutional:  Vitals reviewed on nursing chart.  Voice: Unremarkable  Respiration:  Breathing comfortably, no stridor.   Ears:  Normal tympanic membranes to otoscopy,   Nose:  External nose midline, anterior rhinoscopy is normal with limited visualization just to the anterior interior turbinate.   Oral Cavity/Oropharynx/Lips:  Normal mucous membranes, normal floor of mouth/tongue/oropharynx, no masses or lesions are noted.    Pharyngeal Walls:  No masses noted.  Neck/Lymph:  No lymphadenopathy, no thyroid masses.      Procedure: 02/09/2021     Endoscopy Type:  Flexible Fiberoptic Videostroboscopy    Flexible endoscope serial number [id number] used today by Lawrence Marseilles, MD    Indications/TimeOut:  To better evaluate the patient???s symptoms, fiberoptic videostroboscopy is indicated.   A time out identifying the patient, the procedure, the location of the procedure and any concerns was performed prior to beginning the procedure.    Procedure Details:    The patient was placed in the sitting position.  After topical anesthesia and decongestion with oxymetazoline and lidocaine, the flexible laryngoscope was passed.  The microphone was used to trigger the xenon stroboscopic light source.      -  Hypopharynx - There were no lesions in the pyriformis, epiglottis, or base of tongue.  -  Larynx -  there was moderate interarytenoid edema and erythema.  -  Vocal Folds:                 Supraglottis: Normal     Infraglottis/Subglottis:  Normal     Mobility: Normal bilaterally     Amplitude: Left greater than Right     Mucosal Wave: Mucosal wave intact bilaterally     Closure: Complete     Lesions: Infraglottic edema present    Condition:  Stable.  Patient tolerated procedure well.    Complications: None    Voice- Related Quality of Life (VR-QOL) Measure:  I have trouble speaking loudly or being heard in noisy situations: 5  I run out of air and need to take frequent breaths when talking: 5  I sometimes do not know what will come out when I begin speaking: 3  I am sometimes anxious or frustrated because of my voice: 3  I sometimes get depressed because of my voice: 3  I have trouble using the telephone because of my voice: 3  I have trouble doing my job or practicing my profession because of my voice: 3  I avoid going out socially because of my voice: 1  I have to repeat myself to be understood: 2  I have become less outgoing because of my voice: 1  VRQOL Raw Score: 29  Calculated Score: 52.5     Glottal Function Index:  Speaking took extra effort: 0  Throat discomfort or pain after using your voice: 0  Vocal fatigue (voice weakened as you talked): 2  Voice cracks or sounds different: 2  Glottal Function Index Total: 4     Assessment:   65 y.o. 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   ??  At the end of our previous clinic visit we discussed and recommended clinical course of Respiratory Retraining, Biotene mouthwash, D/C Listerine- switch to salt water gargles   ??  July 2017:??I recommended NeilMed rinses and a workup of glossitis (electrolyte and vitamin levels per primary care physician: B12, zinc, etc.,)??Her diagnoses included right vocal fold hypomobility/depression anxiety.??I  offered to seek information regarding an appropriate referral for burning mouth syndrome.  ??  December 2018:??The patient notes that her symptoms have been relatively stable. ??She continues to complain of burning tongue.????Following this visit I recommended a thyroid hormone panel and thyroid ultrasound.  ??  August 2019:??The patient reports that she continues to clear there throat and cough in a consistent manner. ??Thyroid ultrasound was unremarkable w/o nodules. She occasionally (rarely ) awakens from sleep with cough. She has done a few sessions with SLP, but claims to have forgotten the techniques.  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  I additionally offered behavioral intervention for her cough symptom.  ??  April 2021: In the interim the patient is followed up with neurosurgery and has had radiography demonstrating no additional lesions.  The patient did however complain of right-sided head throbbing, and otalgia.  Ms. Market notes that she has continued to have balance difficulty, intermittent cough and dysphonia.  Lastly, she reports that her throat has been burning more than in the past though she has not seen her GI physician for some time.  The patient reports that she has had ongoing right otalgia that she is treated with hydrogen peroxide.  She has had her ears flushed by her primary care physician without resolution.  The patient was referred to otology for right otalgia.    June 2022: Patient returns with concerns for weak vocal projection, worsened over the past several weeks. She attributes this to her LPR, but has been taking her daily Prilosec with minimal relief. Otherwise, she has been doing well, without any additional concerns.     1. LPR  2. Slightly enlarged right Thyroid gland/ no dominant nodule  3. Burning mouth syndrome??   4. Cough     Given the stability of the patient's physical examination and findings referable to upper airway inflammation, I have a recommended that we proceed with management of her known LPR.    Plan:  1. Add Famotidine 20 mg BID to supplement Prilosec 40 mg. I also instructed the patient with respect to diet and behavior modification for reflux.     2. Follow up in 4 to 6 months for interval evaluation  The patient in agreement the plan as articulated above.      Scribe's Attestation: Suzan Nailer, MD obtained and performed the history, physical exam and medical decision making elements that were entered into the chart. Signed by Welton Flakes, Scribe, on February 09, 2021 at 11:07 AM.    ----------------------------------------------------------------------------------------------------------------------  February 10, 2021 8:09 AM. Documentation assistance provided by the Scribe. I was present during the time the encounter was recorded. The information recorded by the Scribe was done at my direction and has been reviewed and validated by me.  ----------------------------------------------------------------------------------------------------------------------     This note was created with [employer] and may have errors that were not dictated and not seen in editing.     Elana Alm  Briasia Flinders

## 2021-02-09 NOTE — Unmapped (Signed)
Chronic Pain Follow Up Note    Assessment and Plan    Caitlin Smith is a 65 y.o.  being followed at [facility] for complaint of chronic pain localized to right knee and bilateral neck, shoulders and upper back myofascial pain and fibromyalgia, and lower back 2/2 lumbar facet arthropathy and DDD and moderate degree of lumbar central spinal canal stenosis. Patient has a history of chronic pain related to lupus and arthritis and is on rheumatological therapy.     Bilateral shoulder pain  Likely multifactorial, myofascial pain in setting of fibromyalgia and OA (radiographs with moderate AC OA). Denies weakness, tingling, radiculopathic pain.  -Voltaren gel TID  -if pain worsening, referral to ortho for possible shoulder injections    Polyneuropathy  - Continue gabapentin by one tablet weekly to goal of 600 mg TID     Chronic pain syndrome; Fibromyalgia  - Continue gabapentin to 600 mg TID, refilled  - Continue Flexeril 5 mg BID PRN, refilled  - Continue vanlafaxine per PCP  - Continue Trazodone 150 mg qhs per PCP  - Continue voltaren Gel  - Continue pain psychology.  - Continue activity as able  - Follow up on 2/15 with Al Corpus, CPP.    Chronic lower back pain:  Secondary to lumbar facet arthropathy and DDD and moderate degree of lumbar central spinal canal stenosis. Much improved with facet injections 4/22. Continues to have relief as of this appointment.  -Continue vanlafaxine, Flexeril as above.    Future considerations:  Genicular nerve block/RFA  Lumbar MBBs    Requested Prescriptions     Pending Prescriptions Disp Refills   ??? gabapentin (NEURONTIN) 300 MG capsule 360 capsule 2     Sig: Take 2 capsules (600 mg total) by mouth Three (3) times a day.   ??? cyclobenzaprine (FLEXERIL) 5 MG tablet 60 tablet 2     Sig: Take 1 tablet (5 mg total) by mouth two (2) times a day as needed.     No orders of the defined types were placed in this encounter.    Risks and benefits of above medications including but not limited to possibility of respiratory depression, sedation, and even death were discussed with the patient who expressed an understanding.     Urine toxicology screen is not due today.  Treatment agreement renewal is not due today.     HPI  Caitlin Smith is a 65 y.o. being followed at [facility] for complaint of chronic pain localized to right knee and bilateral neck, shoulder, upper back, and lower back 2/2 lumbar facet arthropathy and DDD. Patient has a history of chronic pain related to lupus, arthritis, and fibromyalgia.     At last visit in February with Dr. Doylene Canard, the patient reported fair analgesia but worsening LBP that radiates down her legs, and bilateral pain in her shoulders. She inquired about diagnostic MBB's. Her medications were continued without changes.    On 12/29/20 the patient underwent bilateral Intra-articular Lumbar Facet Injections under Fluoroscopic Guidance at L4/5 and L5/S1with steroid.     Today, she reports her lumbar back pain is much improved as of a few days after her injections. She is able to be more active without back pain.    The patient's medication regimen:  Gabapentin to 600 mg TID  Cymbalta 120mg  am (PCP)  Trazodone 150 mg qhs (PCP)  Flexeril 5 mg BID prn  Voltaren gel prn    The patient states her pain  is located in her bilateral shoulders and back and the severity of her pain ranges from 5/10 to 6/10.  Her pain currently is 5/10 and on average is 5/10.  She describes the sensation of her pain as aching, dull, sore, tender. Her pain is present intermittently and worst during the day. The patient???s pain impacts enjoyment of life, general activity. Her interval history includes spinal or non-spinal procedures. Her pain is better, and she does not have new pain to discuss today. She is not on blood thinners or anti-coagulants. In regards to medications currently taken for pain management, the patient is tolerating these medications well and complains of associated side effects: constipation.    Patient denies homicidal/suicidal ideation.     Medication Monitoring  [place] database was reviewed and it was appropriate.  Last urine toxicology screen: N/A, not on opioids  The patient did not bring pill bottles today, not on opioids      Imaging   MRI lumbar spine 10/28/20    Procedures  Bilateral Intra-articular Lumbar Facet Injections under Fluoroscopic Guidance at L4/5 and L5/S1with steroid 12/29/20      Allergies  Allergies   Allergen Reactions   ??? Vilazodone Swelling   ??? Ace Inhibitors Other (See Comments)     Pt states she can not take ace inhibitors. Pt states she can not remember her reaction.    ??? Bee Pollen Itching     COUGH, WATERY EYES   ??? Penicillin G Rash   ??? Penicillins Rash   ??? Pollen Extracts Itching     COUGHING, WATERY EYES       Home Medications    Current Outpatient Medications   Medication Sig Dispense Refill   ??? amLODIPine (NORVASC) 5 MG tablet Take 1 tablet (5 mg total) by mouth daily. 90 tablet 3   ??? aspirin (ECOTRIN) 81 MG tablet Take 81 mg by mouth daily.     ??? atenoloL (TENORMIN) 25 MG tablet Take 25 mg by mouth daily.     ??? atorvastatin (LIPITOR) 40 MG tablet Take 40 mg by mouth daily.     ??? biotin 1 mg cap Take by mouth.     ??? buPROPion (WELLBUTRIN XL) 150 MG 24 hr tablet Take 1 tablet (150 mg total) by mouth every morning. 90 tablet 1   ??? calcipotriene (DOVONOX) 0.005 % ointment Apply every other day to areas of rash in folds as maintenance 60 g 2   ??? clobetasoL (TEMOVATE) 0.05 % ointment Apply topically nightly. 45 g 3   ??? clonazePAM (KLONOPIN) 0.5 MG tablet Take 0.5 tablets (0.25 mg total) by mouth nightly. 15 tablet 2   ??? clotrimazole-betamethasone (LOTRISONE) 1-0.05 % cream Apply 1 application topically Two (2) times a day.     ??? cyclobenzaprine (FLEXERIL) 5 MG tablet Take 1 tablet (5 mg total) by mouth two (2) times a day as needed. 60 tablet 2   ??? diclofenac sodium (VOLTAREN) 1 % gel Apply 2 g topically two (2) times a day as needed for arthritis. 300 g 5   ??? dicyclomine (BENTYL) 20 mg tablet Take 20 mg by mouth every six (6) hours.     ??? FLOVENT HFA 110 mcg/actuation inhaler USE 2 PUFFS BY MOUTH TWO  TIMES DAILY 36 g 3   ??? fluticasone propionate (FLONASE) 50 mcg/actuation nasal spray      ??? gabapentin (NEURONTIN) 300 MG capsule Take 2 capsules (600 mg total) by mouth Three (3) times a day. 360 capsule 2   ???  hydrOXYchloroQUINE (PLAQUENIL) 200 mg tablet Take 1 tablet (200 mg total) by mouth Two (2) times a day. 180 tablet 3   ??? inhalational spacing device (AEROCHAMBER MV) Spcr 1 each by Miscellaneous route two (2) times a day. With [employer] 1 each 0   ??? ketoconazole (NIZORAL) 2 % cream APP EXT AA D  0   ??? lamoTRIgine (LAMICTAL) 200 MG tablet Take 1 tablet (200 mg total) by mouth Two (2) times a day. 180 tablet 1   ??? meclizine (ANTIVERT) 25 mg tablet Take 25 mg by mouth Three (3) times a day as needed.      ??? melatonin 10 mg Tab Take 1 tablet by mouth.     ??? metroNIDAZOLE (METROGEL) 0.75 % gel Apply one applicatorful (~37.5 mg metronidazole) intravaginally once daily for 5 days. 45 g 0   ??? mupirocin (BACTROBAN) 2 % ointment APP EXT IEN BID  0   ??? mycophenolate (MYFORTIC) 360 MG TbEC Take 1 tablet by mouth twice daily 180 tablet 3   ??? omeprazole (PRILOSEC) 40 MG capsule Take 40 mg by mouth daily.      ??? ondansetron (ZOFRAN) 4 MG tablet      ??? polyethylene glycol (GLYCOLAX) 17 gram/dose powder 2 cap fulls in a full glass of water, three times a day, for 5 days.     ??? telmisartan (MICARDIS) 80 MG tablet Take 80 mg by mouth.     ??? traZODone (DESYREL) 100 MG tablet Take 1.5 tablets (150 mg total) by mouth nightly for 90 doses. 135 tablet 1   ??? triamcinolone (KENALOG) 0.1 % ointment Apply twice a day to affected areas when rough, otherwise just on the weekends 454 g 1   ??? venlafaxine (EFFEXOR-XR) 37.5 MG 24 hr capsule Take 2 capsules (75 mg total) by mouth daily. 180 capsule 1     No current facility-administered medications for this visit. ROS  General fatigue, daytime drowsiness  Cardiovascular none  Gastrointestinal nausea and constipation  Skin dryness  Endocrine thinning hair  Musculoskeletal joint aches/swelling, back pain, muscle aches/weakness  Neurologic dizziness/vertigo, migraines/headaches  Psychiatric depression, anxiety, panic attacks, lack of interest in activities    Physical Exam    VITALS:   There were no vitals filed for this visit.  Wt Readings from Last 3 Encounters:   11/26/20 (!) 106.4 kg (234 lb 9.6 oz)   11/05/20 (!) 108.3 kg (238 lb 12.8 oz)   10/20/20 (!) 107.3 kg (236 lb 9.6 oz)       GENERAL:  The patient is a well developed, well-nourished female and appears to be in no apparent distress. The patient is pleasant and interactive. Patient is a good historian.  No evidence of sedation or intoxication.  HEAD/NECK:    Reveals normocephalic/atraumatic. clear sclera. Mucous membranes are moist.  Range of motion: normal  CARDIOVASCULAR:   Warm and well perfused  RESPIRATORY:   Normal work of breathing  EXTREMITIES:  No clubbing, cyanosis noted.  GASTROINTESTINAL:  Soft, nondistended  NEUROLOGIC:    The patient was alert and oriented, speech fluent, normal language. CN 2-12 grossly intact.  Sensation Intact to light touch throughout the bilateral upper and lower extremities.  MUSCULOSKELETAL:    Motor function  5/5 in upper and lower extremities with normal tone and bulk. Range of motion mildly limited in upper extremities 2/2 pain, passive ROM wnl.  SPINE:  Paraspinal tenderness to palpation present in the Cervical spine.   GAIT:  The patient rises from a seated  position with no difficulty.  The patient was able to ambulate with no difficulty throughout the clinic today without the assistance of a walking aid-None.   SKIN:   No obvious rashes, lesions, or erythema.  PSY:  Appropriate Affect      Documentation assistance was provided by Joline Salt, on February 08, 2021 at 7:53 PM for Dr. Debbra Riding, MD.    February 09, 2021 8:57 AM. Documentation assistance provided by the Scribe. I was present during the time the encounter was recorded. The information recorded by the Scribe was done at my direction and has been reviewed and validated by me. Frederico Hamman MD

## 2021-02-09 NOTE — Unmapped (Signed)
AVS Reviewed. Pt voiced understanding.

## 2021-02-09 NOTE — Unmapped (Signed)
Use voltaren gel to your shoulders up to 4 times daily  If pain not improved you can go to the walk in orthopedics clinic or we can refer you for further evaluation  Continue medications as prescribed  Follow up in 3 months

## 2021-02-11 ENCOUNTER — Other Ambulatory Visit: Payer: Self-pay | Admitting: Family Medicine

## 2021-02-11 DIAGNOSIS — I998 Other disorder of circulatory system: Secondary | ICD-10-CM

## 2021-02-11 NOTE — Telephone Encounter (Signed)
Requested Prescriptions  Pending Prescriptions Disp Refills  . hydrALAZINE (APRESOLINE) 10 MG tablet [Pharmacy Med Name: HYDRALAZINE 10 MG TABLETS (ORANGE)] 30 tablet 0    Sig: TAKE 1 TABLET BY MOUTH THREE TIMES DAILY AS NEEDED FOR BLOOD PRESSURE ABOVE 150/90     Cardiovascular:  Vasodilators Failed - 02/11/2021  3:35 AM      Failed - Last BP in normal range    BP Readings from Last 1 Encounters:  02/04/21 (!) 142/78         Passed - HCT in normal range and within 360 days    HCT  Date Value Ref Range Status  01/19/2021 39.1 36.0 - 46.0 % Final  12/10/2013 42.4 35.0 - 47.0 % Final         Passed - HGB in normal range and within 360 days    Hemoglobin  Date Value Ref Range Status  01/19/2021 12.3 12.0 - 15.0 g/dL Final   HGB  Date Value Ref Range Status  12/10/2013 14.3 12.0 - 16.0 g/dL Final         Passed - RBC in normal range and within 360 days    RBC  Date Value Ref Range Status  01/19/2021 4.50 3.87 - 5.11 MIL/uL Final         Passed - WBC in normal range and within 360 days    WBC  Date Value Ref Range Status  01/19/2021 4.7 4.0 - 10.5 K/uL Final         Passed - PLT in normal range and within 360 days    Platelets  Date Value Ref Range Status  01/19/2021 266 150 - 400 K/uL Final   Platelet  Date Value Ref Range Status  12/10/2013 326 150 - 440 x10 3/mm 3 Final         Passed - Valid encounter within last 12 months    Recent Outpatient Visits          1 week ago Fluctuating blood pressure   [facility] Steele Sizer, MD   1 week ago    [place], [place]   1 month ago Hyperglycemia   [facility] Steele Sizer, MD   8 months ago Diabetes mellitus type 2 in obese [facility])   [facility] Steele Sizer, MD   12 months ago Benign hypertension   [facility] Steele Sizer, MD      Future Appointments            In 4 months Ancil Boozer,  Drue Stager, MD [facility], [place]   In 6 months  [facility], [facility]

## 2021-02-11 NOTE — Unmapped (Signed)
I saw and evaluated the patient, participating in the key portions of the service.  I reviewed the resident’s note.  I agree with the resident’s findings and plan.

## 2021-02-17 ENCOUNTER — Encounter
Admit: 2021-02-17 | Discharge: 2021-02-18 | Payer: MEDICARE | Attending: Student in an Organized Health Care Education/Training Program | Primary: Student in an Organized Health Care Education/Training Program

## 2021-02-17 MED ORDER — CLONAZEPAM 0.5 MG TABLET
ORAL_TABLET | Freq: Every evening | ORAL | 2 refills | 30 days
Start: 2021-02-17 — End: ?

## 2021-02-17 NOTE — Unmapped (Signed)
[facility] Refill Coordination Note    Specialty Medication(s) to be Shipped:   Transplant: mycophenolate mofetil 360mg     Other medication(s) to be shipped: No additional medications requested for fill at this time     Caitlin Smith, DOB: 19-Mar-1956  Phone: [phone] (home)       All above HIPAA information was verified with patient.     Was a Nurse, learning disability used for this call? No    Completed refill call assessment today to schedule patient's medication shipment from the [facility] [phone]).  All relevant notes have been reviewed.     Specialty medication(s) and dose(s) confirmed: Regimen is correct and unchanged.   Changes to medications: Caitlin Smith reports no changes at this time.  Changes to insurance: No  New side effects reported not previously addressed with a pharmacist or physician: None reported  Questions for the pharmacist: No    Confirmed patient received a Conservation officer, historic buildings and a Surveyor, mining with first shipment. The patient will receive a drug information handout for each medication shipped and additional FDA Medication Guides as required.       DISEASE/MEDICATION-SPECIFIC INFORMATION        N/A    SPECIALTY MEDICATION ADHERENCE     Medication Adherence    Patient reported X missed doses in the last month: 0  Specialty Medication: MYCOPHENOLATE 360 MG  Patient is on additional specialty medications: No  Informant: patient  Confirmed plan for next specialty medication refill: delivery by pharmacy  Refills needed for supportive medications: not needed          Refill Coordination    Has the Patients' Contact Information Changed: No  Is the Shipping Address Different: No         Were doses missed due to medication being on hold? No    MYCOPHENOLATE 360 mg: 7 days of medicine on hand       REFERRAL TO PHARMACIST     Referral to the pharmacist: Not needed      [facility]     Shipping address confirmed in Epic.     Delivery Scheduled: Yes, Expected medication delivery date: 6/17.     Medication will be delivered via Same Day Courier to the prescription address in Epic WAM.    Jolene Schimke   [facility] Specialty Technician
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
## 2021-02-19 MED FILL — MYCOPHENOLATE SODIUM 360 MG TABLET,DELAYED RELEASE: ORAL | 90 days supply | Qty: 180 | Fill #3

## 2021-02-20 ENCOUNTER — Other Ambulatory Visit: Payer: Self-pay | Admitting: Family Medicine

## 2021-02-20 DIAGNOSIS — I998 Other disorder of circulatory system: Secondary | ICD-10-CM

## 2021-02-20 NOTE — Telephone Encounter (Signed)
Requested Prescriptions  Pending Prescriptions Disp Refills  . hydrALAZINE (APRESOLINE) 10 MG tablet [Pharmacy Med Name: HYDRALAZINE 10 MG TABLETS (ORANGE)] 90 tablet 0    Sig: TAKE 1 TABLET BY MOUTH THREE TIMES DAILY AS NEEDED FOR BLOOD PRESSURE ABOVE 150/90     Cardiovascular:  Vasodilators Failed - 02/20/2021  3:34 AM      Failed - Last BP in normal range    BP Readings from Last 1 Encounters:  02/04/21 (!) 142/78         Passed - HCT in normal range and within 360 days    HCT  Date Value Ref Range Status  01/19/2021 39.1 36.0 - 46.0 % Final  12/10/2013 42.4 35.0 - 47.0 % Final         Passed - HGB in normal range and within 360 days    Hemoglobin  Date Value Ref Range Status  01/19/2021 12.3 12.0 - 15.0 g/dL Final   HGB  Date Value Ref Range Status  12/10/2013 14.3 12.0 - 16.0 g/dL Final         Passed - RBC in normal range and within 360 days    RBC  Date Value Ref Range Status  01/19/2021 4.50 3.87 - 5.11 MIL/uL Final         Passed - WBC in normal range and within 360 days    WBC  Date Value Ref Range Status  01/19/2021 4.7 4.0 - 10.5 K/uL Final         Passed - PLT in normal range and within 360 days    Platelets  Date Value Ref Range Status  01/19/2021 266 150 - 400 K/uL Final   Platelet  Date Value Ref Range Status  12/10/2013 326 150 - 440 x10 3/mm 3 Final         Passed - Valid encounter within last 12 months    Recent Outpatient Visits          2 weeks ago Fluctuating blood pressure   [facility] Steele Sizer, MD   2 weeks ago    [place]   2 months ago Hyperglycemia   [facility] Steele Sizer, MD   8 months ago Diabetes mellitus type 2 in obese [facility])   [facility] Steele Sizer, MD   1 year ago Benign hypertension   [facility] Steele Sizer, MD      Future Appointments            In 3 months Ancil Boozer,  Drue Stager, MD [facility], [place]   In 5 months  [facility]

## 2021-02-20 NOTE — Telephone Encounter (Signed)
Filled today 02/20/21

## 2021-02-23 ENCOUNTER — Encounter: Admit: 2021-02-23 | Discharge: 2021-02-24 | Payer: MEDICARE | Attending: Psychologist | Primary: Psychologist

## 2021-02-23 DIAGNOSIS — F4310 Post-traumatic stress disorder, unspecified: Principal | ICD-10-CM

## 2021-02-23 DIAGNOSIS — F411 Generalized anxiety disorder: Principal | ICD-10-CM

## 2021-02-23 DIAGNOSIS — F331 Major depressive disorder, recurrent, moderate: Principal | ICD-10-CM

## 2021-02-23 DIAGNOSIS — F410 Panic disorder [episodic paroxysmal anxiety] without agoraphobia: Principal | ICD-10-CM
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
## 2021-03-03 ENCOUNTER — Encounter: Admit: 2021-03-03 | Discharge: 2021-03-04 | Payer: MEDICARE

## 2021-03-03 DIAGNOSIS — J4531 Mild persistent asthma with (acute) exacerbation: Secondary | ICD-10-CM | POA: Diagnosis not present

## 2021-03-03 DIAGNOSIS — J45909 Unspecified asthma, uncomplicated: Secondary | ICD-10-CM | POA: Diagnosis not present

## 2021-03-03 DIAGNOSIS — R6889 Other general symptoms and signs: Secondary | ICD-10-CM | POA: Diagnosis not present

## 2021-03-03 DIAGNOSIS — J8489 Other specified interstitial pulmonary diseases: Secondary | ICD-10-CM | POA: Diagnosis not present

## 2021-03-03 MED ORDER — ALBUTEROL SULFATE HFA 90 MCG/ACTUATION AEROSOL INHALER
Freq: Four times a day (QID) | RESPIRATORY_TRACT | 11 refills | 0.00000 days | Status: CP | PRN
Start: 2021-03-03 — End: 2022-03-03

## 2021-03-03 NOTE — Unmapped (Signed)
Hi Caitlin Smith,    It was great meeting you today. Here is what we talked about.    1) Cryptogenic Organizing Pneumonia (COP)  - Seems like you are doing well  - repeat breathing tests in 4 months  - If stable or better then think about coming off cellcept after talking to rheumatology  - If worse then will get repeat CT  - In the meantime work on regular exercise - goal of 5x weekly, 30 minutes at a time     Please return to clinic in 4 months.     If you have any non-urgent questions or concerns please reach out by calling the clinic or via MyChart.    Thanks,  Layla Maw MD  Pulmonary and Critical Care Fellow

## 2021-03-03 NOTE — Unmapped (Signed)
Pulmonary Clinic - Follow-up Visit      HISTORY:     Active Pulmonary Problems & Brief History:  Caitlin Smith is a 65 y.o. woman with many medical issues here for follow up of follow up for organizing pneumonia .     Interval History:  - 2016 and prior - patient followed at [facility]. They thought her symptoms of dyspnea were from obesity and asthma and recommended ENT evaluation. They also stated concern for chronic aspiration.   - 04/2016 - patient in [facility] system. She was thought to have multifactorial dyspnea, waxing and waning pulmonary opacities. Evaluated by pulmonary hypertension team without evidence of pulmonary hypertension. Underwent bronchoscopy with BAL and transbronchial biopsies which showed significant inflammation. DDX remained broad - CEP, COP, SLE pneumonitis, ABPA, EGPA, AEP, but it was decided to treat her with prolonged steroid taper.   - 08/23/16 - last visit with Dr. Johnnette Barrios - was improving with steroid taper   - 10/20/16 - established with Dr. Nilsa Nutting - there had been question of compliance with her prednisone since last visit, but by time of visit she had been on it consistently for one month. No clear improvement in dyspnea, but remains inactive.   - 02/09/17 - follow up - spirometry showed improvement in FVC and DLCOafter 2 months of steroid, had tapered off by time of visit. When she stopped the prednisone she developed increased cough, chest tightness, dyspnea.   - 03/27/17 - follow up - given steroid dependence was placed on MMF.   - 10/16/17 - follow up - dose reduced MMF for GI side effects. Prolonged steroid taper over this time.   - 02/06/2019 - last visit with pulmonary, Dr. Judeth Horn - at that time had gone to local ED for chest pain, CT chest showed interstitial lung disease in the lung bases, favored to reflect mild progression of chronic fibrotic nonspecific interstitial pneumonia (NSIP). It was unclear to Dr. Judeth Horn if that represented actual progression of her underlying disease.   - 03/03/21 - patient reports she has dyspnea on exertion not associated with chest pain. Not currently using any inhalers. Still has some chronic cough, but ENT thinks she's having reflux so has her on PPI + H2 blocker. Cough non-productive. No F/C/NS, N/V/D, upset tummy. Sleeps laying flat, no nocturnal awakenings. Reports negative sleep studies for OSA. No LE edema.     Past Medical History: The medical and surgical history were personally reviewed and updated in the patient's electronic medical record. Pertinent positives are documented above.  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 Pertinent positives are documented above.  Home Medications: Medications were reviewed and updated in the patient's electronic medical record. Pertinent positives are documented above.   Allergies: Allergies were reviewed and updated in the patient's electronic medical record. Pertinent positives are documented above.  Review of Systems: A comprehensive review of systems was completed and negative except as noted in HPI.    PHYSICAL EXAM:     Vitals:  03/03/21 1158   BP: 143/87   Pulse: 60   Resp: 16   Temp: 36.3 ??C   SpO2: 99%       General: pleasant individual appearing stated age, alert and oriented, no acute distress  HEENT: trachea midline, supple  CV: RRR, no m/r/g  Lungs: CTAB, no increased work of breathing  Abd: Soft, NT/ND, no rebound or guarding  Ext: Warm, well perfused, no peripheral edema  Skin: No rashes, skin breakdown, or wounds  Neuro: CN II-XII intact to conversation, alert and oriented.     LABORATORY and RADIOLOGY DATA:     Pulmonary Function Tests/Interpretation:  03/03/21          Pertinent Laboratory Data:  04/25/16 BAL and TBBX   Cultures negative except for OPF     Cell count  PMN 31%  Lymph 11%  Macs 43%  Eos 7%  Baso 6%     Pathology   A: Lung, right lower lobe, endobronchial biopsy   - DIP-like reaction pattern with admixed eosinophils, consistent with eosinophilic pneumonia (see Micro Exam)  - Patchy organizing pneumonia  - Interstitial lymphoplasmacytic infiltrate, polytypic  - Partially denuded benign respiratory mucosa, without viral cytopathic effect  - No malignant neoplasm identified   - GMS and  AFB stains negative for fungi or AFB    Cytology  Lung, Bronchial lavage:  - No malignant cells identified.   - Alveolar macrophages and mixed inflammation.     Pertinent Imaging Data:  07/11/2017 CT Chest  AIRWAYS, LUNGS, PLEURA: Nearly resolved multifocal and bilateral perihilar bronchovascular nodular and consolidative opacities compared to 02/06/2017. Residual patchy right basilar ground-glass opacities persist.  New 3.2 cm superior segment right lower lobe curvilinear consolidation (image 242, series 5).  Subpleural right middle lobe cysts unchanged, may represent honeycombing.  No pleural effusion.    IMPRESSION:  Resolved multifocal, bilateral lung consolidations compared to 02/06/2017.  New right lower lobe curvilinear consolidation, indeterminate, but favor new focus of organizing pneumonia.    02/06/17 CT Chest  AIRWAYS, LUNGS, PLEURA:   Clear central airways. No bronchiectasis or bronchial wall thickening.   Previously seen peripheral pulmonary parenchymal opacities, improved from previous study but there are new peripheral/pleural-based pulmonary parenchymal opacities, predominantly in the upper lobes.  No focal air-trapping or honeycombing.   No pleural effusion.    -Waxing and waning pulmonary parenchymal opacities. Favor cryptogenic organizing pneumonia/eosinophilic pneumonia. Can't rule out multilobar pneumonia completely. Recommend 3-6 month follow-up chest CT to document resolution.    09/09/16 HRCT Chest  Clear central airways. No bronchiectasis or bronchial wall thickening.   Interval increase in bilateral lower lobe predominant patchy multifocal consolidations. Similar appearance of subpleural reticulation at the lung bases.  No focal air-trapping or honeycombing.  No pleural effusion.    --Increase in lower lobe predominant patchy multifocal consolidations. Favor organizing pneumonia versus eosinophilic pneumonia.  --Similar appearance of bibasilar subpleural articulation, likely mild fibrosis.        Pertinent Cardiac Data:  04/14/16 TTE   ?? Left ventricular hypertrophy - mild  ?? Normal left ventricular systolic function, ejection fraction 55 to 60%  ?? Diastolic dysfunction - grade I (normal filling pressures)  ?? Normal right ventricular systolic function      ASSESSMENT and PLAN     Problem List  Cryptogenic Organizing Pneumonia (COP)  SLE   GERD  BMI 37     Assessment  Patient doing well clinically. Last imaging 2020 pretty clear, no significant disease burden. PFTs today with  stable FEV1 and FVC but reduction in DLCO, though within range of variability. Given it is lower than prior will repeat in 4 months. If stable to elevated will assume it's just within her range of variance and consider weaning MMF after reaching out to rheumatology. If it worsens will repeat CT to re-evaluate parenchyma.    Plan  - Repeat spirometry and clinic visit in 4 months   - Encouraged aerobic exercise    The patient was seen with Dr. Carney Bern and will return to clinic in 4 months.    Layla Maw MD  Pulmonary and Critical Care Fellow

## 2021-03-23 ENCOUNTER — Encounter: Admit: 2021-03-23 | Discharge: 2021-03-24 | Payer: MEDICARE | Attending: Psychologist | Primary: Psychologist

## 2021-03-23 DIAGNOSIS — F411 Generalized anxiety disorder: Principal | ICD-10-CM

## 2021-03-23 DIAGNOSIS — F4310 Post-traumatic stress disorder, unspecified: Principal | ICD-10-CM

## 2021-03-23 DIAGNOSIS — F410 Panic disorder [episodic paroxysmal anxiety] without agoraphobia: Principal | ICD-10-CM

## 2021-03-23 DIAGNOSIS — F331 Major depressive disorder, recurrent, moderate: Principal | ICD-10-CM

## 2021-03-23 MED ORDER — CLONAZEPAM 0.5 MG TABLET
ORAL_TABLET | Freq: Every evening | ORAL | 2 refills | 30 days | Status: CN
Start: 2021-03-23 — End: ?
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
## 2021-03-23 NOTE — Unmapped (Addendum)
Error

## 2021-03-30 NOTE — Unmapped (Signed)
I personally spent 20 minutes face-to-face with the patient and greater than 50% of that time was spent in counseling or coordinating care with the patient regarding Cryptogenic organizing pneu monia or boopp restrictive but stble spir and dlo I agrre with the pulm fellows note and plans  Vernie Shanks, MD

## 2021-03-31 ENCOUNTER — Other Ambulatory Visit: Payer: Self-pay | Admitting: Family Medicine

## 2021-03-31 DIAGNOSIS — I998 Other disorder of circulatory system: Secondary | ICD-10-CM

## 2021-03-31 NOTE — Telephone Encounter (Signed)
Requested Prescriptions  Pending Prescriptions Disp Refills  . hydrALAZINE (APRESOLINE) 10 MG tablet [Pharmacy Med Name: HYDRALAZINE 10 MG TABLETS (ORANGE)] 90 tablet 2    Sig: TAKE 1 TABLET BY MOUTH THREE TIMES DAILY AS NEEDED FOR BLOOD PRESSURE ABOVE 150/90     Cardiovascular:  Vasodilators Failed - 03/31/2021  3:35 AM      Failed - Last BP in normal range    BP Readings from Last 1 Encounters:  02/04/21 (!) 142/78         Passed - HCT in normal range and within 360 days    HCT  Date Value Ref Range Status  01/19/2021 39.1 36.0 - 46.0 % Final  12/10/2013 42.4 35.0 - 47.0 % Final         Passed - HGB in normal range and within 360 days    Hemoglobin  Date Value Ref Range Status  01/19/2021 12.3 12.0 - 15.0 g/dL Final   HGB  Date Value Ref Range Status  12/10/2013 14.3 12.0 - 16.0 g/dL Final         Passed - RBC in normal range and within 360 days    RBC  Date Value Ref Range Status  01/19/2021 4.50 3.87 - 5.11 MIL/uL Final         Passed - WBC in normal range and within 360 days    WBC  Date Value Ref Range Status  01/19/2021 4.7 4.0 - 10.5 K/uL Final         Passed - PLT in normal range and within 360 days    Platelets  Date Value Ref Range Status  01/19/2021 266 150 - 400 K/uL Final   Platelet  Date Value Ref Range Status  12/10/2013 326 150 - 440 x10 3/mm 3 Final         Passed - Valid encounter within last 12 months    Recent Outpatient Visits          1 month ago Fluctuating blood pressure   [facility] Steele Sizer, MD   1 month ago    [place], [place]   3 months ago Hyperglycemia   [facility] Steele Sizer, MD   9 months ago Diabetes mellitus type 2 in obese [facility])   [facility] Steele Sizer, MD   1 year ago Benign hypertension   [facility] Steele Sizer, MD      Future Appointments            In 2 months Ancil Boozer,  Drue Stager, MD [facility] [place]   In 4 months  [facility]

## 2021-04-05 NOTE — Unmapped (Signed)
Pt caled to remind her provider to send in her inhaler.

## 2021-04-07 ENCOUNTER — Other Ambulatory Visit: Payer: Self-pay | Admitting: Family Medicine
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
## 2021-04-08 ENCOUNTER — Ambulatory Visit: Admit: 2021-04-08 | Discharge: 2021-04-09 | Payer: MEDICARE

## 2021-04-08 ENCOUNTER — Encounter: Admit: 2021-04-08 | Discharge: 2021-04-09 | Payer: MEDICARE

## 2021-04-08 DIAGNOSIS — M1712 Unilateral primary osteoarthritis, left knee: Secondary | ICD-10-CM | POA: Diagnosis not present

## 2021-04-08 DIAGNOSIS — Z96651 Presence of right artificial knee joint: Secondary | ICD-10-CM | POA: Diagnosis not present

## 2021-04-08 DIAGNOSIS — G8929 Other chronic pain: Secondary | ICD-10-CM | POA: Diagnosis not present

## 2021-04-08 DIAGNOSIS — M25561 Pain in right knee: Secondary | ICD-10-CM | POA: Diagnosis not present

## 2021-04-08 MED ORDER — FAMOTIDINE 20 MG TABLET
ORAL_TABLET | Freq: Two times a day (BID) | ORAL | 3 refills | 30 days | Status: CP
Start: 2021-04-08 — End: 2021-06-07

## 2021-04-08 MED ADMIN — triamcinolone acetonide (KENALOG-40) injection 40 mg: 40 mg | INTRA_ARTICULAR | @ 13:00:00 | Stop: 2021-04-08

## 2021-04-08 NOTE — Unmapped (Signed)
Thank you for choosing [facility]!  We appreciate the opportunity to participate in your care.      You received a corticosteroid injection to reduce pain and inflammation.  Please note that it can take up to 2 weeks for this injection to fully work.  While many people will feel relief sooner, please be patient.      The injection contained a corticosteroid and a numbing agent.  The numbing agent can last for 1-6 hours.  After this wears off you may have increased pain until the steroid has a chance to work.    What are some of the possible side effects of a steroid injection?    Common side effects:  temporarily elevated blood sugar (in diabetic patients) that can last a few days   flushing of the skin, especially the face  temporary rise in blood pressure  discoloration or atrophy of the skin at the injection site    Call your doctor at once if you have:  persistent worsening pain or swelling, fever;  blurred vision, tunnel vision, eye pain, or seeing halos around lights;  fast or slow heartbeats;  increased blood pressure that is associated with severe headache, blurred vision, pounding in your neck or ears, anxiety, nosebleed;  headaches, ringing in your ears, dizziness, nausea, vision problems, pain behind your eyes    This is not a complete list of side effects and others may occur. Call your doctor for medical advice about side effects.     You can resume your normal daily activities, but consider resting the injected area for the next few days.      If any questions or concerns arise after your visit, please do not hesitant to contact me by [facility] or by calling the total joint team at [phone] or [phone].       Voicemail messages: Messages are checked between 8:00 am- 4:00 pm Monday- Friday.     MyChart messages: These messages are checked by the nurses during normal business hours 8:30 am-4:30 pm Monday-Friday every 24-48 hours and are for non-urgent, non-emergent concerns. You may be asked to return for a follow up visit if it is deemed your questions are best handled in the clinic setting.     Please let me know if I can be of assistance with this or other orthopaedic issues in the future.

## 2021-04-09 MED ORDER — ALBUTEROL SULFATE HFA 90 MCG/ACTUATION AEROSOL INHALER
Freq: Four times a day (QID) | RESPIRATORY_TRACT | 11 refills | 0 days | PRN
Start: 2021-04-09 — End: 2022-04-09

## 2021-04-09 NOTE — Unmapped (Signed)
pt called again asking about refill for her inhaler. Routing to provider for refill.

## 2021-04-28 DIAGNOSIS — M3219 Other organ or system involvement in systemic lupus erythematosus: Principal | ICD-10-CM

## 2021-04-28 MED ORDER — MYCOPHENOLATE SODIUM 360 MG TABLET,DELAYED RELEASE
ORAL_TABLET | Freq: Two times a day (BID) | ORAL | 3 refills | 90.00000 days | Status: CP
Start: 2021-04-28 — End: ?
  Filled 2021-05-03: qty 180, 90d supply, fill #0

## 2021-04-28 NOTE — Unmapped (Signed)
[facility] Clinical Assessment & Refill Coordination Note    Caitlin Smith, DOB: [birth year]  Phone: [phone] (home)     All above HIPAA information was verified with patient.     Was a Nurse, learning disability used for this call? No    Specialty Medication(s):   Transplant:  mycophenolic acid 360mg mg     Current Outpatient Medications   Medication Sig Dispense Refill   ??? albuterol HFA 90 mcg/actuation inhaler Inhale 2 puffs every six (6) hours as needed for wheezing. 8 g 11   ??? amLODIPine (NORVASC) 5 MG tablet Take 1 tablet (5 mg total) by mouth daily. 90 tablet 3   ??? aspirin (ECOTRIN) 81 MG tablet Take 81 mg by mouth daily.     ??? atenoloL (TENORMIN) 25 MG tablet Take 25 mg by mouth daily.     ??? atorvastatin (LIPITOR) 40 MG tablet Take 40 mg by mouth daily.     ??? biotin 1 mg cap Take by mouth.     ??? buPROPion (WELLBUTRIN XL) 150 MG 24 hr tablet Take 1 tablet (150 mg total) by mouth every morning. 90 tablet 1   ??? calcipotriene (DOVONOX) 0.005 % ointment Apply every other day to areas of rash in folds as maintenance 60 g 2   ??? clobetasoL (TEMOVATE) 0.05 % ointment Apply topically nightly. 45 g 3   ??? clonazePAM (KLONOPIN) 0.5 MG tablet Take 0.5 tablets (0.25 mg total) by mouth nightly. 15 tablet 2   ??? clotrimazole-betamethasone (LOTRISONE) 1-0.05 % cream Apply 1 application topically Two (2) times a day.     ??? cyclobenzaprine (FLEXERIL) 5 MG tablet Take 1 tablet (5 mg total) by mouth two (2) times a day as needed. 60 tablet 2   ??? diclofenac sodium (VOLTAREN) 1 % gel Apply 2 g topically two (2) times a day as needed for arthritis. 300 g 5   ??? dicyclomine (BENTYL) 20 mg tablet Take 20 mg by mouth every six (6) hours.     ??? famotidine (PEPCID) 20 MG tablet Take 1 tablet (20 mg total) by mouth Two (2) times a day. 60 tablet 3   ??? FLOVENT HFA 110 mcg/actuation inhaler USE 2 PUFFS BY MOUTH TWO  TIMES DAILY 36 g 3   ??? fluticasone propionate (FLONASE) 50 mcg/actuation nasal spray      ??? gabapentin (NEURONTIN) 300 MG capsule Take 2 capsules (600 mg total) by mouth Three (3) times a day. 360 capsule 2   ??? hydrALAZINE (APRESOLINE) 10 MG tablet      ??? hydrOXYchloroQUINE (PLAQUENIL) 200 mg tablet Take 1 tablet (200 mg total) by mouth Two (2) times a day. 180 tablet 3   ??? inhalational spacing device (AEROCHAMBER MV) Spcr 1 each by Miscellaneous route two (2) times a day. With Fovent 1 each 0   ??? ketoconazole (NIZORAL) 2 % cream APP EXT AA D  0   ??? lamoTRIgine (LAMICTAL) 200 MG tablet Take 1 tablet (200 mg total) by mouth Two (2) times a day. 180 tablet 1   ??? losartan (COZAAR) 50 MG tablet Take 50 mg by mouth in the morning.     ??? meclizine (ANTIVERT) 25 mg tablet Take 25 mg by mouth Three (3) times a day as needed.      ??? melatonin 10 mg Tab Take 1 tablet by mouth.     ??? metroNIDAZOLE (METROGEL) 0.75 % gel Apply one applicatorful (~37.5 mg metronidazole) intravaginally once daily for 5 days. 45 g  0   ??? mupirocin (BACTROBAN) 2 % ointment APP EXT IEN BID  0   ??? mycophenolate (MYFORTIC) 360 MG TbEC Take 1 tablet by mouth twice daily 180 tablet 3   ??? omeprazole (PRILOSEC) 40 MG capsule Take 40 mg by mouth daily.      ??? ondansetron (ZOFRAN) 4 MG tablet      ??? polyethylene glycol (GLYCOLAX) 17 gram/dose powder 2 cap fulls in a full glass of water, three times a day, for 5 days.     ??? pregabalin (LYRICA) 50 MG capsule      ??? sucralfate (CARAFATE) 1 gram tablet      ??? telmisartan (MICARDIS) 80 MG tablet Take 80 mg by mouth.     ??? traZODone (DESYREL) 100 MG tablet Take 1.5 tablets (150 mg total) by mouth nightly for 90 doses. 135 tablet 1   ??? triamcinolone (KENALOG) 0.1 % ointment Apply twice a day to affected areas when rough, otherwise just on the weekends 454 g 1   ??? venlafaxine (EFFEXOR-XR) 37.5 MG 24 hr capsule Take 2 capsules (75 mg total) by mouth daily. 180 capsule 1     No current facility-administered medications for this visit.        Changes to medications: [name] reports no changes at this time.    Allergies Allergen Reactions   ??? Vilazodone Swelling   ??? Ace Inhibitors Other (See Comments)     Pt states she can not take ace inhibitors. Pt states she can not remember her reaction.    ??? Bee Pollen Itching     COUGH, WATERY EYES   ??? Penicillin G Rash   ??? Penicillins Rash   ??? Pollen Extracts Itching     COUGHING, WATERY EYES       Changes to allergies: No    SPECIALTY MEDICATION ADHERENCE     mycophenolate 360 mg: 14 days of medicine on hand        Medication Adherence    Patient reported X missed doses in the last month: 0  Specialty Medication: mycophenolate 360mg  BID  Patient is on additional specialty medications: No  Informant: patient          Specialty medication(s) dose(s) confirmed: Regimen is correct and unchanged.     Are there any concerns with adherence? No    Adherence counseling provided? Not needed    CLINICAL MANAGEMENT AND INTERVENTION      Clinical Benefit Assessment:    Do you feel the medicine is effective or helping your condition? Yes    Clinical Benefit counseling provided? Progress note from 11/26/20 shows evidence of clinical benefit. Has an appt tomorrow    Adverse Effects Assessment:    Are you experiencing any side effects? No    Are you experiencing difficulty administering your medicine? No    Quality of Life Assessment:    Quality of Life    Rheumatology  1. What impact has your specialty medication had on the reduction of your daily pain level?: Some  2. What impact has your specialty medication had on your ability to complete daily tasks (prepare meals, get dressed, etc...)?: None  Oncology  Dermatology  Cystic Fibrosis              Have you discussed this with your provider? Yes    Acute Infection Status:    Acute infections noted within Epic:  No active infections  Patient reported infection: None    Therapy Appropriateness:    Is therapy  appropriate? Yes, therapy is appropriate and should be continued    DISEASE/MEDICATION-SPECIFIC INFORMATION      N/A    PATIENT SPECIFIC NEEDS - Does the patient have any physical, cognitive, or cultural barriers? No    - Is the patient high risk? No    - Does the patient require a Care Management Plan? No     - Does the patient require physician intervention or other additional services (i.e. nutrition, smoking cessation, social work)? No      SHIPPING     Specialty Medication(s) to be Shipped:   Inflammatory Disorders: mycophenolate    Other medication(s) to be shipped: No additional medications requested for fill at this time     Changes to insurance: No    Delivery Scheduled: Yes, Expected medication delivery date: 8/30.     Medication will be delivered via UPS to the confirmed prescription address in [facility].    The patient will receive a drug information handout for each medication shipped and additional FDA Medication Guides as required.  Verified that patient has previously received a Conservation officer, historic buildings and a Surveyor, mining.    The patient or caregiver noted above participated in the development of this care plan and knows that they can request review of or adjustments to the care plan at any time.      All of the patient's questions and concerns have been addressed.    Julianne Rice   [facility] Specialty Pharmacist

## 2021-04-28 NOTE — Unmapped (Signed)
Mycophenolate (myfortic) 360 mg tbec    Last Visit Date: 11/26/2020  Next Visit Date: 04/29/2021    Lab Results   Component Value Date    ALT 16 12/29/2020    AST 28 12/29/2020    ALBUMIN 3.7 12/29/2020    CREATININE 0.85 (H) 12/29/2020     Lab Results   Component Value Date    WBC 3.6 12/29/2020    HGB 13.6 12/29/2020    HCT 41.0 12/29/2020    PLT 308 12/29/2020     Lab Results   Component Value Date    NEUTROPCT 54.4 12/29/2020    LYMPHOPCT 32.7 12/29/2020    MONOPCT 8.2 12/29/2020    EOSPCT 3.6 12/29/2020    BASOPCT 1.1 12/29/2020

## 2021-04-28 NOTE — Unmapped (Unsigned)
Chronic Pain Follow Up Note    Assessment and Plan    Caitlin Smith is a 65 y.o.  being followed at [facility] clinic for complaint of chronic pain localized to right knee and bilateral neck, shoulders and upper back myofascial pain and fibromyalgia, and lower back 2/2 lumbar facet arthropathy and DDD and moderate degree of lumbar central spinal canal stenosis. Patient has a history of chronic pain related to lupus and arthritis and is on rheumatological therapy.     Bilateral shoulder pain ***  Likely multifactorial, myofascial pain in setting of fibromyalgia and OA (radiographs with moderate AC OA). Denies weakness, tingling, radiculopathic pain.  -Voltaren gel TID  -if pain worsening, referral to ortho for possible shoulder injections    Polyneuropathy  - Continue gabapentin by one tablet weekly to goal of 600 mg TID     Chronic pain syndrome; Fibromyalgia  - Continue gabapentin to 600 mg TID, refilled  - Continue Flexeril 5 mg BID PRN, refilled  - Continue vanlafaxine per PCP  - Continue Trazodone 150 mg qhs per PCP  - Continue voltaren Gel  - Continue pain psychology.  - Continue activity as able  - Follow up on 2/15 with Al Corpus, CPP.    Chronic lower back pain:  Secondary to lumbar facet arthropathy and DDD and moderate degree of lumbar central spinal canal stenosis. Much improved with facet injections 4/22. Continues to have relief as of this appointment.  -Continue vanlafaxine, Flexeril as above.    Future considerations:  Genicular nerve block/RFA  Lumbar MBBs    Requested Prescriptions      No prescriptions requested or ordered in this encounter     No orders of the defined types were placed in this encounter.    Risks and benefits of above medications including but not limited to possibility of respiratory depression, sedation, and even death were discussed with the patient who expressed an understanding.     Urine toxicology screen is not due today.  Treatment agreement renewal is not due today.     HPI  Caitlin Smith is a 65 y.o. being followed at [facility] clinic for complaint of chronic pain localized to right knee and bilateral neck, shoulder, upper back, and lower back 2/2 lumbar facet arthropathy and DDD. Patient has a history of chronic pain related to lupus, arthritis, and fibromyalgia.     At last visit in June, the patient reported stable analgesia and denied any adverse side effects. Her medications were continued without changes. Since that time, the patient has followed with Pain Psychology.    Today, ***.    The patient's medication regimen:  Gabapentin to 600 mg TID  Cymbalta 120mg  am (PCP)  Trazodone 150 mg qhs (PCP)  Flexeril 5 mg BID prn  Voltaren gel prn    The patient states her pain is located *** and the severity of her pain ranges from ***/10 to ***/10.  Her pain currently is ***/10 and on average is ***/10.  She describes the sensation of her pain as {pain described:58928}. Her pain is present {pain frequency:58931} and worst {pain worst:59685}. The patient???s pain impacts {negatively affected:59709}. Her interval history includes {interval history:59710}. Her pain {pain changed:59711}, and she {does does not blank:47747} have new pain to discuss today. She {is/is not:36772} on blood thinners or anti-coagulants. In regards to medications currently taken for pain management, the patient {is:54246::is} tolerating these medications well and complains of associated side effects: {pain med side effects:59712}.  Patient denies homicidal/suicidal ideation.     Medication Monitoring  [place] database was reviewed and it was appropriate.  Last urine toxicology screen: N/A, not on opioids  The patient did not bring pill bottles today, not on opioids      Imaging   MRI lumbar spine 10/28/20    Procedures  Bilateral Intra-articular Lumbar Facet Injections under Fluoroscopic Guidance at L4/5 and L5/S1with steroid 12/29/20      Allergies  Allergies   Allergen Reactions ??? Vilazodone Swelling   ??? Ace Inhibitors Other (See Comments)     Pt states she can not take ace inhibitors. Pt states she can not remember her reaction.    ??? Bee Pollen Itching     COUGH, WATERY EYES   ??? Penicillin G Rash   ??? Penicillins Rash   ??? Pollen Extracts Itching     COUGHING, WATERY EYES       Home Medications    Current Outpatient Medications   Medication Sig Dispense Refill   ??? albuterol HFA 90 mcg/actuation inhaler Inhale 2 puffs every six (6) hours as needed for wheezing. 8 g 11   ??? amLODIPine (NORVASC) 5 MG tablet Take 1 tablet (5 mg total) by mouth daily. 90 tablet 3   ??? aspirin (ECOTRIN) 81 MG tablet Take 81 mg by mouth daily.     ??? atenoloL (TENORMIN) 25 MG tablet Take 25 mg by mouth daily.     ??? atorvastatin (LIPITOR) 40 MG tablet Take 40 mg by mouth daily.     ??? biotin 1 mg cap Take by mouth.     ??? buPROPion (WELLBUTRIN XL) 150 MG 24 hr tablet Take 1 tablet (150 mg total) by mouth every morning. 90 tablet 1   ??? calcipotriene (DOVONOX) 0.005 % ointment Apply every other day to areas of rash in folds as maintenance 60 g 2   ??? clobetasoL (TEMOVATE) 0.05 % ointment Apply topically nightly. 45 g 3   ??? clonazePAM (KLONOPIN) 0.5 MG tablet Take 0.5 tablets (0.25 mg total) by mouth nightly. 15 tablet 2   ??? clotrimazole-betamethasone (LOTRISONE) 1-0.05 % cream Apply 1 application topically Two (2) times a day.     ??? cyclobenzaprine (FLEXERIL) 5 MG tablet Take 1 tablet (5 mg total) by mouth two (2) times a day as needed. 60 tablet 2   ??? diclofenac sodium (VOLTAREN) 1 % gel Apply 2 g topically two (2) times a day as needed for arthritis. 300 g 5   ??? dicyclomine (BENTYL) 20 mg tablet Take 20 mg by mouth every six (6) hours.     ??? famotidine (PEPCID) 20 MG tablet Take 1 tablet (20 mg total) by mouth Two (2) times a day. 60 tablet 3   ??? FLOVENT HFA 110 mcg/actuation inhaler USE 2 PUFFS BY MOUTH TWO  TIMES DAILY 36 g 3   ??? fluticasone propionate (FLONASE) 50 mcg/actuation nasal spray      ??? gabapentin (NEURONTIN) 300 MG capsule Take 2 capsules (600 mg total) by mouth Three (3) times a day. 360 capsule 2   ??? hydrALAZINE (APRESOLINE) 10 MG tablet      ??? hydrOXYchloroQUINE (PLAQUENIL) 200 mg tablet Take 1 tablet (200 mg total) by mouth Two (2) times a day. 180 tablet 3   ??? inhalational spacing device (AEROCHAMBER MV) Spcr 1 each by Miscellaneous route two (2) times a day. With Fovent 1 each 0   ??? ketoconazole (NIZORAL) 2 % cream APP EXT AA D  0   ???  lamoTRIgine (LAMICTAL) 200 MG tablet Take 1 tablet (200 mg total) by mouth Two (2) times a day. 180 tablet 1   ??? losartan (COZAAR) 50 MG tablet Take 50 mg by mouth in the morning.     ??? meclizine (ANTIVERT) 25 mg tablet Take 25 mg by mouth Three (3) times a day as needed.      ??? melatonin 10 mg Tab Take 1 tablet by mouth.     ??? metroNIDAZOLE (METROGEL) 0.75 % gel Apply one applicatorful (~37.5 mg metronidazole) intravaginally once daily for 5 days. 45 g 0   ??? mupirocin (BACTROBAN) 2 % ointment APP EXT IEN BID  0   ??? mycophenolate (MYFORTIC) 360 MG TbEC Take 1 tablet by mouth twice daily 180 tablet 3   ??? omeprazole (PRILOSEC) 40 MG capsule Take 40 mg by mouth daily.      ??? ondansetron (ZOFRAN) 4 MG tablet      ??? polyethylene glycol (GLYCOLAX) 17 gram/dose powder 2 cap fulls in a full glass of water, three times a day, for 5 days.     ??? pregabalin (LYRICA) 50 MG capsule      ??? sucralfate (CARAFATE) 1 gram tablet      ??? telmisartan (MICARDIS) 80 MG tablet Take 80 mg by mouth.     ??? traZODone (DESYREL) 100 MG tablet Take 1.5 tablets (150 mg total) by mouth nightly for 90 doses. 135 tablet 1   ??? triamcinolone (KENALOG) 0.1 % ointment Apply twice a day to affected areas when rough, otherwise just on the weekends 454 g 1   ??? venlafaxine (EFFEXOR-XR) 37.5 MG 24 hr capsule Take 2 capsules (75 mg total) by mouth daily. 180 capsule 1     No current facility-administered medications for this visit.     ROS  General {ajlrosgen:46920}  Cardiovascular {aggROSCV:37303}  Gastrointestinal {aggROSGI:37304}  Skin {aggROSskin:37305}  Endocrine {aggROSendo:37306}  Musculoskeletal {aggROSmusk:37307}  Neurologic {aggROSneu:37308}  Psychiatric {aggROSpsych:37309}    Physical Exam    VITALS:   There were no vitals filed for this visit.  Wt Readings from Last 3 Encounters:   03/03/21 (!) 107 kg (236 lb)   02/09/21 (!) 107.7 kg (237 lb 6.4 oz)   02/09/21 (!) 107.8 kg (237 lb 9.6 oz)       GENERAL:  The patient is well developed, well-nourished, and appears to be in no apparent distress.   HEAD/NECK:    Normocephalic/atraumatic. clear sclera, pupils not pinpoint  CV:  RR  LUNGS:   Normal work of breathing, no supplemental O2  EXTREMITIES:  No clubbing, cyanosis noted.  NEUROLOGIC:    The patient is alert and oriented, speech fluent, normal language.   MUSCULOSKELETAL:    Motor function  preserved. Good range of motion of all extremities.   GAIT:  The patient rises from a seated position with no difficulty and ambulates with nonantalgic gait without the assistance of a walking aid.   SKIN:   No obvious rashes, lesions, or erythema.  PSY:   Appropriate affect. No overt pain behaviors. No evidence of psychomotor retardation or agitation, no signs of intoxication.

## 2021-04-29 ENCOUNTER — Ambulatory Visit: Admit: 2021-04-29 | Discharge: 2021-04-29 | Payer: MEDICARE

## 2021-04-29 DIAGNOSIS — R0602 Shortness of breath: Secondary | ICD-10-CM | POA: Diagnosis not present

## 2021-04-29 DIAGNOSIS — D849 Immunodeficiency, unspecified: Secondary | ICD-10-CM | POA: Insufficient documentation

## 2021-04-29 DIAGNOSIS — Z791 Long term (current) use of non-steroidal anti-inflammatories (NSAID): Secondary | ICD-10-CM | POA: Diagnosis not present

## 2021-04-29 DIAGNOSIS — M329 Systemic lupus erythematosus, unspecified: Secondary | ICD-10-CM | POA: Diagnosis not present

## 2021-04-29 DIAGNOSIS — Z7982 Long term (current) use of aspirin: Secondary | ICD-10-CM | POA: Diagnosis not present

## 2021-04-29 DIAGNOSIS — M797 Fibromyalgia: Secondary | ICD-10-CM | POA: Diagnosis not present

## 2021-04-29 DIAGNOSIS — I129 Hypertensive chronic kidney disease with stage 1 through stage 4 chronic kidney disease, or unspecified chronic kidney disease: Secondary | ICD-10-CM | POA: Diagnosis not present

## 2021-04-29 DIAGNOSIS — Z23 Encounter for immunization: Secondary | ICD-10-CM | POA: Diagnosis not present

## 2021-04-29 DIAGNOSIS — R12 Heartburn: Secondary | ICD-10-CM | POA: Diagnosis not present

## 2021-04-29 DIAGNOSIS — Z79899 Other long term (current) drug therapy: Secondary | ICD-10-CM | POA: Diagnosis not present

## 2021-04-29 DIAGNOSIS — G4700 Insomnia, unspecified: Secondary | ICD-10-CM | POA: Diagnosis not present

## 2021-04-29 DIAGNOSIS — R079 Chest pain, unspecified: Secondary | ICD-10-CM | POA: Diagnosis not present

## 2021-04-29 DIAGNOSIS — N181 Chronic kidney disease, stage 1: Secondary | ICD-10-CM | POA: Diagnosis not present

## 2021-04-29 DIAGNOSIS — M1990 Unspecified osteoarthritis, unspecified site: Secondary | ICD-10-CM | POA: Diagnosis not present

## 2021-04-29 DIAGNOSIS — Z880 Allergy status to penicillin: Secondary | ICD-10-CM | POA: Diagnosis not present

## 2021-04-29 LAB — URINALYSIS WITH CULTURE REFLEX
BILIRUBIN UA: NEGATIVE
BLOOD UA: NEGATIVE
GLUCOSE UA: NEGATIVE
KETONES UA: NEGATIVE
NITRITE UA: NEGATIVE
PH UA: 5.5 (ref 5.0–9.0)
PROTEIN UA: NEGATIVE
RBC UA: <1 /HPF (ref 0–3)
SPECIFIC GRAVITY UA: >=1.03 (ref 1.005–1.030)
SQUAMOUS EPITHELIAL: 2 /HPF (ref 0–5)
UROBILINOGEN UA: 0.2
WBC UA: 2 /HPF (ref 0–3)

## 2021-04-29 LAB — BUN: BLOOD UREA NITROGEN: 10 mg/dL (ref 9–23)

## 2021-04-29 LAB — ALBUMIN: ALBUMIN: 3.6 g/dL (ref 3.4–5.0)

## 2021-04-29 LAB — CBC W/ AUTO DIFF
BASOPHILS ABSOLUTE COUNT: 0.1 10*9/L (ref 0.0–0.1)
BASOPHILS RELATIVE PERCENT: 1.3 %
EOSINOPHILS ABSOLUTE COUNT: 0.2 10*9/L (ref 0.0–0.5)
EOSINOPHILS RELATIVE PERCENT: 3.5 %
HEMATOCRIT: 40.7 % (ref 34.0–44.0)
HEMOGLOBIN: 13.2 g/dL (ref 11.3–14.9)
LYMPHOCYTES ABSOLUTE COUNT: 1.5 10*9/L (ref 1.1–3.6)
LYMPHOCYTES RELATIVE PERCENT: 34.9 %
MEAN CORPUSCULAR HEMOGLOBIN CONC: 32.5 g/dL (ref 32.0–36.0)
MEAN CORPUSCULAR HEMOGLOBIN: 28.2 pg (ref 25.9–32.4)
MEAN CORPUSCULAR VOLUME: 86.8 fL (ref 77.6–95.7)
MEAN PLATELET VOLUME: 7.8 fL (ref 6.8–10.7)
MONOCYTES ABSOLUTE COUNT: 0.4 10*9/L (ref 0.3–0.8)
MONOCYTES RELATIVE PERCENT: 9 %
NEUTROPHILS ABSOLUTE COUNT: 2.3 10*9/L (ref 1.8–7.8)
NEUTROPHILS RELATIVE PERCENT: 51.3 %
PLATELET COUNT: 347 10*9/L (ref 150–450)
RED BLOOD CELL COUNT: 4.69 10*12/L (ref 3.95–5.13)
RED CELL DISTRIBUTION WIDTH: 14.5 % (ref 12.2–15.2)
WBC ADJUSTED: 4.4 10*9/L (ref 3.6–11.2)

## 2021-04-29 LAB — PROTEIN / CREATININE RATIO, URINE
CREATININE, URINE: 142.7 mg/dL
PROTEIN URINE: 23 mg/dL
PROTEIN/CREAT RATIO, URINE: 0.161

## 2021-04-29 LAB — CREATININE
CREATININE: 0.85 mg/dL — ABNORMAL HIGH
EGFR CKD-EPI (2021) FEMALE: 77 mL/min/{1.73_m2} (ref >=60)

## 2021-04-29 LAB — C4 COMPLEMENT: C4 COMPLEMENT: 21.7 mg/dL (ref 12.0–36.0)

## 2021-04-29 LAB — ALT: ALT (SGPT): 14 U/L (ref 10–49)

## 2021-04-29 LAB — C3 COMPLEMENT: C3 COMPLEMENT: 123 mg/dL (ref 90–170)

## 2021-04-29 LAB — AST: AST (SGOT): 11 U/L (ref <=34)

## 2021-04-29 MED ORDER — HYDROXYCHLOROQUINE 200 MG TABLET
ORAL_TABLET | Freq: Two times a day (BID) | ORAL | 3 refills | 90 days | Status: CP
Start: 2021-04-29 — End: ?

## 2021-04-29 NOTE — Unmapped (Addendum)
Let me know when you want me to send a referral to physical therapy.     Fact Sheet for Patients, Parents And Caregivers   Emergency Use Authorization (EUA) of EVUSHELD???   (tixagevimab co-packaged with cilgavimab)   for Coronavirus Disease 2019 (COVID-19)    You are being given this Fact Sheet because your healthcare provider believes it is necessary to provide you with EVUSHELD (tixagevimab co-packaged with cilgavimab) for pre-exposure prophylaxis for prevention of coronavirus disease 2019 (COVID-19) caused by the SARS-CoV-2 virus.    This Fact Sheet contains information to help you understand the potential risks and potential benefits of taking EVUSHELD, which you have received or may receive.    The U.S. Food and Drug Administration (FDA) has issued an Emergency Use Authorization (EUA) to make EVUSHELD available during the COVID-19 pandemic (for more details about an EUA please see What is an Emergency Use Authorization? at the end of this document). EVUSHELD is not an FDA-approved medicine in the [place].    Read this Fact Sheet for information about EVUSHELD. Talk to your healthcare provider if you have any questions. It is your choice to receive or not receive EVUSHELD.    What is COVID-19?  COVID-19 is caused by a virus called a coronavirus. You can get COVID-19 through close contact with another person who has the virus.    COVID-19 illnesses have ranged from very mild (including some with no reported symptoms) to severe, including illness resulting in death. While information so far suggests that most COVID-19 illness is mild, serious illness can happen and may cause some of your other medical conditions to become worse. Older people and people of all ages with severe, long-lasting (chronic) medical conditions like heart disease, lung disease, and diabetes, for example, seem to be at higher risk of being hospitalized for COVID-19.    What is EVUSHELD (tixagevimab co-packaged with cilgavimab)?  EVUSHELD is an investigational medicine used in adults and adolescents (63 years of age and older who weigh at least 88 pounds [40 kg]) for pre-exposure prophylaxis for prevention of COVID-19 in persons who are:  not currently infected with SARS-CoV-2 and who have not had recent known close contact with someone who is infected with SARS-CoV-2 and  Who have moderate to severe immune compromise due to a medical condition or have received immunosuppressive medicines or treatments and may not mount an adequate immune response to COVID-19 vaccination or  For whom vaccination with any available COVID-19 vaccine, according to the approved or authorized schedule, is not recommended due to a history of severe adverse reaction (such as severe allergic reaction) to a COVID-19 vaccine(s) or COVID-19 vaccine ingredient(s).    EVUSHELD is investigational because it is still being studied. There is limited information known about the safety and effectiveness of using EVUSHELD for pre-exposure prophylaxis for prevention of COVID-19. EVUSHELD is not authorized for post-exposure prophylaxis for prevention of COVID-19.    The FDA has authorized the emergency use of EVUSHELD for pre-exposure prophylaxis for prevention of COVID-19 under an Emergency Use Authorization (EUA).    What should I tell my healthcare provider before I receive EVUSHELD?  Tell your healthcare provider if you:  Have any allergies  Have low numbers of blood platelets (which help blood clotting), a bleeding disorder, or are taking anticoagulants (to prevent blood clots)  Have had a heart attack or stroke, have other heart problems, or are at high-risk of cardiac (heart) events  Are pregnant or plan to become pregnant  Are breastfeeding a child  Have any serious illness  Are taking any medications (prescription, over-the-counter, vitamins, or herbal products)     How will I receive EVUSHELD?  EVUSHELD consists of two investigational medicines, tixagevimab and cilgavimab.  You will receive 1 dose of EVUSHELD, consisting of 2 separate injections (tixagevimab and cilgavimab).  EVUSHELD will be given to you by your healthcare provider as 2 intramuscular injections. They are usually, given one after the other, 1 into each of your buttocks.    After the initial dose, if your healthcare provider determines that you need to receive additional doses of EVUSHELD for ongoing protection, the additional doses would be administered once every 6 months.    Who should generally not take EVUSHELD?  Do not take EVUSHELD if you have had a severe allergic reaction to EVUSHELD or any ingredient in EVUSHELD.    What are the important possible side effects of EVUSHELD?  Possible side effects of EVUSHELD are:  Allergic reactions. Allergic reactions can happen during and after injection of EVUSHELD. Tell your healthcare provider right away if you get any of the following signs and symptoms of allergic reactions: fever, chills, nausea, headache, shortness of breath, low or high blood pressure, rapid or slow heart rate, chest discomfort or pain, weakness, confusion, feeling tired, wheezing, swelling of your lips, face, or throat, rash including hives, itching, muscle aches, dizziness and sweating. These reactions may be severe or life threatening.  Cardiac (heart) events: Serious cardiac adverse events have happened, but were not common, in people who received EVUSHELD and also in people who did not receive EVUSHELD in the clinical trial studying pre-exposure prophylaxis for prevention of COVID-19. In people with risk factors for cardiac events (including a history of heart attack), more people who received EVUSHELD experienced serious cardiac events than people who did not receive EVUSHELD. It is not known if these events are related to [facility] or underlying medical conditions. Contact your healthcare provider or get medical help right away if you get any symptoms of cardiac events, including pain, pressure, or discomfort in the chest, arms, neck, back, stomach or jaw, as well as shortness of breath, feeling tired or weak (fatigue), feeling sick (nausea), or swelling in your ankles or lower legs.    The side effects of getting any medicine by intramuscular injection may include pain, bruising of the skin, soreness, swelling, and possible bleeding or infection at the injection site.    These are not all the possible side effects of EVUSHELD. Not a lot of people have been given EVUSHELD. Serious and unexpected side effects may happen. EVUSHELD is still being studied so it is possible that all of the risks are not known at this time.    It is possible that EVUSHELD may reduce your body's immune response to a COVID-19 vaccine. If you have received a COVID-19 vaccine, you should wait to receive EVUSHELD until at least 2 weeks after COVID-19 vaccination.    What other prevention choices are there?  Vaccines to prevent COVID-19 are approved or available under Emergency Use Authorization. Use of EVUSHELD does not replace vaccination against COVID-19. For more information about other medicines authorized for treatment or prevention of COVID-19 go to [email or web address] for more information.    It is your choice to receive or not receive EVUSHELD. Should you decide not to receive EVUSHELD, it will not change your standard medical care.    EVUSHELD is not authorized for post-exposure prophylaxis of COVID-19.    What if  I am pregnant or breastfeeding?  If you are pregnant or breastfeeding, discuss your options and specific situation with your healthcare provider.    How do I report side effects with EVUSHELD?  Contact your healthcare provider if you have any side effects that bother you or do not go away. Report side effects to FDA MedWatch at [employer] or call 1-800-FDA-1088 or call [employer] at [phone].    Additional Information  If you have questions, visit the website or call the telephone number provided below.    Website  Telephone number    http://www.evusheld.com  [phone]      How can I learn more about COVID-19?  Ask your healthcare provider.  Visit: [email or web address]    Contact your local or state public health department.    What is an Emergency Use Authorization?  The [place] FDA has made EVUSHELD (tixagevimab co-packaged with cilgavimab) available under an emergency access mechanism called an Emergency Use Authorization EUA. The EUA is supported by a Surveyor, minerals and Human Service (HHS) declaration that circumstances exist to justify the emergency use of drugs and biological products during the COVID-19 pandemic.    EVUSHELD for pre-exposure prophylaxis for prevention of coronavirus disease 2019 (COVID-19) caused by the SARS-CoV-2 virus has not undergone the same type of review as an FDA-approved product. In issuing an EUA under the COVID-19 public health emergency, the FDA has determined, among other things, that based on the total amount of scientific evidence available including data from adequate and well-controlled clinical trials, if available, it is reasonable to believe that the product may be effective for diagnosing, treating, or preventing COVID-19, or a serious or life-threatening disease or condition caused by COVID-19; that the known and potential benefits of the product, when used to diagnose, treat, or prevent such disease or condition, outweigh the known and potential risks of such product; and that there are no adequate, approved and available alternatives.    All of these criteria must be met to allow for the product to be used in the treatment of patients during the COVID-19 pandemic. The EUA for EVUSHELD is in effect for the duration of the COVID-19 declaration justifying emergency use of EVUSHELD,  unless terminated or revoked (after which EVUSHELD may no longer be used under the EUA).    Distributed by: [employer], [place], [place] [address]    Manufactured by: [employer], [address], [place], [place] [address], [place]    ??[employer] 2021. All rights reserved.

## 2021-04-29 NOTE — Unmapped (Signed)
Chief Compliant: SLE, OA, fibromyalgia follow-up      HPI: Caitlin Smith is a 65 y.o. female who is here today for follow-up for SLE, OA, and fibromyalgia.  Initially diagnosed in 2006 with SLE presenting with episcleritis and arthralgia with serology showing +ANA, +dsDNA.  She also has stage I CKD felt secondary not due to SLE but due to HTN and obesity.   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  Treatment history:  - HCQ  - Previously treated with prednisone for eosinophilic pneumonitis with difficulty tapering due to pulm symptoms, so cellcept was added 2018.   - Cellcept changed to myfortic 2019 due to GI distress.   - tapered off prednisone in late 2019  - Derm eval in 07/2018 consistent with inverse psoriasis, possibly due to prednisone use. Recommended avoiding systemic steroids when possible.   - Current med regimen: Myfortic 360 mg twice daily, Plaquenil 200 mg twice daily.  Also followed by pain clinic for pharmacotherapy for fibromyalgia.     Interim history:  Presents today for f/u.     She received injections in her low back in the interim. This was helpful for pain for about 2 mo, then had retuning pain. Today she note pain in the low back, shoulders, lateral hips. Shoulder pain worse when raising her arms. She has been walking in the mornings for 30 min for exercise, about 4 days weekly.     She notes SOB and CP when walking that is better with rest. Also better when she uses an albuterol inhaler, though she does not like using the inhaler because it makes her feel anxious.   Discussed this with her PCP and they recommended a stress test, but she is not sure if they have ordered this. Her BP has also been more elevated lately, though her PCP has not adjusted BP medications.   She notes some SOB when laying flat, but no CP when laying flat. She has heartburn, which is worse when laying flat and her ENT has related the SOB when laying down the the acid reflux. She is taking omeprazole and famotidine daily, but still has uncontrolled heart burn.     She feels off balance sometimes when walking.     ROS??: Attests to the above, otherwise, review of ten system is negative.  ??  Past Medical and Surgical History:  ??  Patient Active Problem List    Diagnosis Date Noted   ??? Otalgia 12/03/2019   ??? Hyperglycemia 10/23/2017   ??? Bronchiolitis obliterans organizing pneumonia (CMS-HCC) 09/29/2016   ??? Vertigo 09/29/2016   ??? Neck pain 12/15/2015   ??? Fibromyalgia 10/12/2015   ??? Chronic constipation 04/11/2015   ??? Chronic recurrent major depressive disorder (CMS-HCC) 04/11/2015   ??? Dyslipidemia 04/11/2015   ??? Obesity, diabetes, and hypertension syndrome (CMS-HCC) 04/11/2015   ??? Intertrigo 04/11/2015   ??? Fibrositis 04/11/2015   ??? Gastroesophageal reflux disease without esophagitis 04/11/2015   ??? Vitamin D deficiency 04/11/2015   ??? Seborrhea capitis 04/11/2015   ??? Perennial allergic rhinitis with seasonal variation 04/11/2015   ??? Moderate dysplasia of vulva 04/11/2015   ??? Spinal stenosis of lumbar region 04/11/2015   ??? Keratosis pilaris 04/11/2015   ??? Insomnia, persistent 04/11/2015   ??? Auditory impairment 04/11/2015   ??? Eczema intertrigo 04/11/2015   ??? Neurogenic claudication (CMS-HCC) 04/11/2015   ??? Screening for cardiovascular condition 03/05/2015   ??? Encounter for screening  for cardiovascular disorders 03/05/2015   ??? Treadmill stress test negative for angina pectoris 03/05/2015   ??? Cough 05/20/2014   ??? Shortness of breath 05/20/2014   ??? Regurgitation 04/24/2014   ??? Epigastric burning sensation 04/24/2014   ??? Obesity with body mass index 30 or greater 02/03/2014   ??? S/P total knee replacement 12/31/2013   ??? Asthma 12/31/2013   ??? Asthma, chronic 12/31/2013   ??? H/O total knee replacement 12/31/2013   ??? SLE (systemic lupus erythematosus) (CMS-HCC) 11/28/2013   ??? Episcleritis 09/26/2013   ??? Other diseases of vocal cords 05/28/2013 ??? Vocal cord dysfunction 05/28/2013   ??? Generalized anxiety disorder 03/19/2013   ??? Pain medication agreement signed 02/12/2013   ??? Family history of breast cancer    ??? 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 Back pain 12/18/2012   ??? Multiple pulmonary nodules 11/26/2012   ??? Lung nodule, multiple 11/26/2012   ??? Pulmonary nodules 11/26/2012   ??? Osteoarthrosis 11/01/2012   ??? Osteoarthritis 11/01/2012   ??? Arthritis, degenerative 11/01/2012   ??? Postinflammatory pulmonary fibrosis (CMS-HCC) 10/22/2012   ??? Aspiration pneumonitis (CMS-HCC) 10/22/2012   ??? H/O aspiration pneumonitis 10/22/2012   ??? Mixed urge and stress incontinence 05/04/2012   ??? Dysphonia 03/22/2012   ??? Sensorineural hearing loss 03/22/2012   ??? Myalgia and myositis 02/25/2011   ??? Benign neoplasm of pituitary gland and craniopharyngeal duct (pouch) (CMS-HCC) 12/29/2010   ??? Chronic kidney disease, stage I 12/08/2010   ??? Proteinuria 12/08/2010   ??? Lung involvement in systemic lupus erythematosus (CMS-HCC) 11/05/2010   ??? Chronic nausea 07/01/2008   ??? Hypertension, benign 01/25/2005     Allergies:   ??  Allergies   Allergen Reactions   ??? Vilazodone Swelling   ??? Ace Inhibitors Other (See Comments)     Pt states she can not take ace inhibitors. Pt states she can not remember her reaction.    ??? Bee Pollen Itching     COUGH, WATERY EYES   ??? Penicillin G Rash   ??? Penicillins Rash   ??? Pollen Extracts Itching     COUGHING, WATERY EYES     Current Outpatient Medications:  ??  Current Outpatient Medications on File Prior to Visit   Medication Sig Dispense Refill   ??? albuterol HFA 90 mcg/actuation inhaler Inhale 2 puffs every six (6) hours as needed for wheezing. 8 g 11   ??? amLODIPine (NORVASC) 2.5 MG tablet Take 3 tablets by mouth daily.     ??? amLODIPine (NORVASC) 5 MG tablet Take 1 tablet (5 mg total) by mouth daily. 90 tablet 3   ??? aspirin (ECOTRIN) 81 MG tablet Take 81 mg by mouth daily.     ??? atenoloL (TENORMIN) 25 MG tablet Take 25 mg by mouth daily.     ??? atorvastatin (LIPITOR) 40 MG tablet Take 40 mg by mouth daily.     ??? biotin 1 mg cap Take by mouth.     ??? buPROPion (WELLBUTRIN XL) 150 MG 24 hr tablet Take 1 tablet (150 mg total) by mouth every morning. 90 tablet 1   ??? calcipotriene (DOVONOX) 0.005 % ointment Apply every other day to areas of rash in folds as maintenance 60 g 2   ??? clobetasoL (TEMOVATE) 0.05 % ointment Apply topically nightly. 45 g 3   ??? clonazePAM (KLONOPIN) 0.5 MG tablet Take 0.5 tablets (0.25 mg total) by mouth nightly. 15 tablet 2   ??? clotrimazole-betamethasone (LOTRISONE) 1-0.05 % cream Apply  1 application topically Two (2) times a day.     ??? cyclobenzaprine (FLEXERIL) 5 MG tablet Take 1 tablet (5 mg total) by mouth two (2) times a day as needed. 60 tablet 2   ??? diclofenac sodium (VOLTAREN) 1 % gel Apply 2 g topically two (2) times a day as needed for arthritis. 300 g 5   ??? dicyclomine (BENTYL) 20 mg tablet Take 20 mg by mouth every six (6) hours.     ??? famotidine (PEPCID) 20 MG tablet Take 1 tablet (20 mg total) by mouth Two (2) times a day. 60 tablet 3   ??? fluticasone propionate (FLONASE) 50 mcg/actuation nasal spray      ??? gabapentin (NEURONTIN) 300 MG capsule Take 2 capsules (600 mg total) by mouth Three (3) times a day. 360 capsule 2   ??? hydrALAZINE (APRESOLINE) 10 MG tablet      ??? hydrOXYchloroQUINE (PLAQUENIL) 200 mg tablet Take 1 tablet (200 mg total) by mouth Two (2) times a day. 180 tablet 3   ??? inhalational spacing device (AEROCHAMBER MV) Spcr 1 each by Miscellaneous route two (2) times a day. With Fovent 1 each 0   ??? ketoconazole (NIZORAL) 2 % cream APP EXT AA D  0   ??? lamoTRIgine (LAMICTAL) 200 MG tablet Take 1 tablet (200 mg total) by mouth Two (2) times a day. 180 tablet 1   ??? losartan (COZAAR) 50 MG tablet Take 50 mg by mouth in the morning.     ??? meclizine (ANTIVERT) 25 mg tablet Take 25 mg by mouth Three (3) times a day as needed.      ??? melatonin 10 mg Tab Take 1 tablet by mouth.     ??? metroNIDAZOLE (METROGEL) 0.75 % gel Apply one applicatorful (~37.5 mg metronidazole) intravaginally once daily for 5 days. 45 g 0   ??? mupirocin (BACTROBAN) 2 % ointment APP EXT IEN BID  0   ??? mycophenolate (MYFORTIC) 360 MG TbEC Take 1 tablet by mouth twice daily 180 tablet 3   ??? omeprazole (PRILOSEC) 40 MG capsule Take 40 mg by mouth daily.      ??? ondansetron (ZOFRAN) 4 MG tablet      ??? polyethylene glycol (GLYCOLAX) 17 gram/dose powder 2 cap fulls in a full glass of water, three times a day, for 5 days.     ??? pregabalin (LYRICA) 50 MG capsule      ??? telmisartan (MICARDIS) 80 MG tablet Take 80 mg by mouth.     ??? traZODone (DESYREL) 100 MG tablet Take 1.5 tablets (150 mg total) by mouth nightly for 90 doses. 135 tablet 1   ??? triamcinolone (KENALOG) 0.1 % ointment Apply twice a day to affected areas when rough, otherwise just on the weekends 454 g 1   ??? venlafaxine (EFFEXOR-XR) 37.5 MG 24 hr capsule Take 2 capsules (75 mg total) by mouth daily. 180 capsule 1   ??? FLOVENT HFA 110 mcg/actuation inhaler USE 2 PUFFS BY MOUTH TWO  TIMES DAILY 36 g 3   ??? sucralfate (CARAFATE) 1 gram tablet        No current facility-administered medications on file prior to visit.     ?  ????  PHYSICAL EXAM??  Vitals:    04/29/21 0814   BP: 145/83   Pulse: 57   Temp: 36.2 ??C (97.2 ??F)   Weight: (!) 106.6 kg (235 lb)   Height: 172.7 cm (5' 8)     General:   Pleasant 65 y.o.female  in no acute distress, WDWN   Cardiovascular:  Regular rate and rhythm. No murmur, rub, or gallop. No lower extremity edema.    Lungs:  Clear to auscultation.Normal respiratory effort.    Musculoskeletal:   General: Ambulates w/o assistance. Multiple tender points +around shoulders and hips. No chest wall tenderness.   Hands: No swelling or tenderness. Able to make a tight fist b/l   Wrists:FROM w/o swelling or tenderness   Elbows: FROM w/o swelling or tenderness   Shoulders: FROM w/o pain   Hips: FROM w/o pain   Knees: FROM w/o effusions. Surgical scar on the R.   Ankles: No swelling or tenderness   Feet: No pain with MTP squeeze    Neurological:  CN 2-12 grossly intact. 5/5 strength on extremities.   Psych:  Appropriate affect and mood   Skin:  No rashes.            ?GENERAL SUMMARY/IMPRESSION AND RECOMMENDATIONS: ??  1. Systemic lupus erythematosus, unspecified SLE type, unspecified organ involvement status (CMS-HCC)  Overall stable appearing. Continue myfortic 360 BID, HCQ 200 mg BID. Check labs below for disease activity and medication toxicity.   - Echocardiogram W Colorflow Spectral Doppler; Future  - Anti-DNA antibody, double-stranded  - BUN  - C3 complement  - C4 complement  - CBC w/ Differential  - Creatinine  - Protein/Creatinine Ratio, Urine  - Urinalysis with Culture Reflex  - Albumin  - ALT  - AST  - hydrOXYchloroQUINE (PLAQUENIL) 200 mg tablet; Take 1 tablet (200 mg total) by mouth Two (2) times a day.  Dispense: 180 tablet; Refill: 3    2. Chest pain and DOE   Could be related to deconditioning, CAD, reactive airway disease. Lower suspicion for lupus pleuritis or pericarditis. She should f/u with PCP for stress test and management of HTN. Will check echo to r/o pericarditis.   - Echocardiogram W Colorflow Spectral Doppler; Future    3. Fibromyalgia, lumbar stenosis, feeling off balance   Recommend PT for all these issues. She does not currently have a car, so cannot go to PT. She will let me know when she has transportation again and is interested in a PT referral.    Continue f/u with pain clinic.               HCM:   - PCV13 Status: 05/26/2016  - PPSV 23 Status: 08/04/2016  - Annual Influenza vaccine. Status: Did not discuss today  -COVID-19 vaccine:Moderna 11/20/19, 12/23/19, 08/25/20, 04/29/21. Hold MMF x 1 wk after dose today   - COVID-19 prophylaxis: Pt currently qualifies for Evusheld for COVID prophylaxis. Discussed risks and benefits of Evusheld and pt given fact sheet regarding this medication. Pt elected to receive this. Will be scheduled for this in December when she comes for f/u.   - Bone health: Dexa 11/2017 with normal BMD.    - Plaquenil eye exam:11/10/20  - Contraception: postmenopausal        RTC 4 mo as scheduled with Dr Scarlette Calico   Greater than 30 minutes spent in visit with patient, including pre and postvisit activities. e

## 2021-05-03 ENCOUNTER — Institutional Professional Consult (permissible substitution): Admit: 2021-05-03 | Discharge: 2021-05-04 | Payer: MEDICARE | Attending: Psychologist | Primary: Psychologist

## 2021-05-03 DIAGNOSIS — F410 Panic disorder [episodic paroxysmal anxiety] without agoraphobia: Principal | ICD-10-CM

## 2021-05-03 DIAGNOSIS — F331 Major depressive disorder, recurrent, moderate: Principal | ICD-10-CM

## 2021-05-03 DIAGNOSIS — F4310 Post-traumatic stress disorder, unspecified: Principal | ICD-10-CM

## 2021-05-03 DIAGNOSIS — F411 Generalized anxiety disorder: Principal | ICD-10-CM
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
## 2021-05-06 LAB — ANTI-DNA ANTIBODY, DOUBLE-STRANDED
DSDNA AB TITER: 1:40 {titer}
DSDNA ANTIBODY: POSITIVE — AB

## 2021-05-06 MED ORDER — DICLOFENAC 1 % TOPICAL GEL
Freq: Two times a day (BID) | TOPICAL | 5 refills | 75 days | PRN
Start: 2021-05-06 — End: ?

## 2021-05-06 MED ORDER — CYCLOBENZAPRINE 5 MG TABLET
ORAL_TABLET | Freq: Two times a day (BID) | ORAL | 2 refills | 30.00000 days | PRN
Start: 2021-05-06 — End: ?

## 2021-05-06 MED ORDER — GABAPENTIN 300 MG CAPSULE
ORAL_CAPSULE | Freq: Three times a day (TID) | ORAL | 2 refills | 60.00000 days
Start: 2021-05-06 — End: 2021-07-05

## 2021-05-07 ENCOUNTER — Ambulatory Visit: Admit: 2021-05-07 | Payer: MEDICARE

## 2021-05-13 ENCOUNTER — Other Ambulatory Visit: Payer: Self-pay | Admitting: Family Medicine

## 2021-05-13 NOTE — Telephone Encounter (Signed)
Requested Prescriptions  Pending Prescriptions Disp Refills  . atorvastatin (LIPITOR) 40 MG tablet [Pharmacy Med Name: Atorvastatin Calcium 40 MG Oral Tablet] 90 tablet 2    Sig: TAKE 1 TABLET BY MOUTH  DAILY     Cardiovascular:  Antilipid - Statins Passed - 05/13/2021  6:31 AM      Passed - Total Cholesterol in normal range and within 360 days    Cholesterol, Total  Date Value Ref Range Status  05/06/2015 178 100 - 199 mg/dL Final   Cholesterol  Date Value Ref Range Status  06/15/2020 151 <200 mg/dL Final         Passed - LDL in normal range and within 360 days    LDL Cholesterol (Calc)  Date Value Ref Range Status  06/15/2020 74 mg/dL (calc) Final    Comment:    Reference range: <100 . Desirable range <100 mg/dL for primary prevention;   <70 mg/dL for patients with CHD or diabetic patients  with > or = 2 CHD risk factors. Marland Kitchen LDL-C is now calculated using the Martin-Hopkins  calculation, which is a validated novel method providing  better accuracy than the Friedewald equation in the  estimation of LDL-C.  Cresenciano Genre et al. Annamaria Helling. [id number]): 2061-2068  (http://education.[employer].com/faq/FAQ164)          Passed - HDL in normal range and within 360 days    HDL  Date Value Ref Range Status  06/15/2020 61 > OR = 50 mg/dL Final  05/06/2015 56 >39 mg/dL Final    Comment:    According to ATP-III Guidelines, HDL-C >59 mg/dL is considered a negative risk factor for CHD.          Passed - Triglycerides in normal range and within 360 days    Triglycerides  Date Value Ref Range Status  06/15/2020 78 <150 mg/dL Final         Passed - Patient is not pregnant      Passed - Valid encounter within last 12 months    Recent Outpatient Visits          3 months ago Fluctuating blood pressure   [facility] Steele Sizer, MD   3 months ago    [place], NP   5 months ago Hyperglycemia   [facility], Drue Stager, MD   11 months ago Diabetes mellitus type 2 in obese [facility])   [facility] Steele Sizer, MD   1 year ago Benign hypertension   [facility] Steele Sizer, MD      Future Appointments            In 1 month Steele Sizer, MD [facility] [place]   In 3 months  [facility]           . telmisartan (MICARDIS) 80 MG tablet [Pharmacy Med Name: Telmisartan 80 MG Oral Tablet] 90 tablet 0    Sig: TAKE 1 TABLET BY MOUTH  DAILY     Cardiovascular:  Angiotensin Receptor Blockers Failed - 05/13/2021  6:31 AM      Failed - Last BP in normal range    BP Readings from Last 1 Encounters:  02/04/21 (!) 142/78         Passed - Cr in normal range and within 180 days    Creat  Date Value Ref Range Status  06/15/2020 0.98 0.50 - 0.99 mg/dL Final    Comment:  For patients >64 years of age, the reference limit for Creatinine is approximately 13% higher for people identified as African-American. .    Creatinine, Ser  Date Value Ref Range Status  01/19/2021 0.99 0.44 - 1.00 mg/dL Final   Creatinine, Urine  Date Value Ref Range Status  11/07/2019 62 20 - 275 mg/dL Final         Passed - K in normal range and within 180 days    Potassium  Date Value Ref Range Status  01/19/2021 4.5 3.5 - 5.1 mmol/L Final  12/10/2013 3.7 3.5 - 5.1 mmol/L Final         Passed - Patient is not pregnant      Passed - Valid encounter within last 6 months    Recent Outpatient Visits          3 months ago Fluctuating blood pressure   [facility] Steele Sizer, MD   3 months ago    [place], NP   5 months ago Hyperglycemia   [facility] Steele Sizer, MD   11 months ago Diabetes mellitus type 2 in obese [facility])   [facility] Steele Sizer, MD   1 year ago Benign hypertension   [facility] Steele Sizer, MD      Future Appointments            In 1 month Ancil Boozer, Drue Stager, MD [facility], [place]   In 3 months  [facility], [facility]

## 2021-05-16 LAB — HM DIABETES FOOT EXAM: HM Diabetic Foot Exam: 9

## 2021-05-17 DIAGNOSIS — F329 Major depressive disorder, single episode, unspecified: Principal | ICD-10-CM

## 2021-05-17 DIAGNOSIS — F411 Generalized anxiety disorder: Principal | ICD-10-CM

## 2021-05-17 MED ORDER — CYCLOBENZAPRINE 5 MG TABLET
ORAL_TABLET | Freq: Two times a day (BID) | ORAL | 2 refills | 30 days | PRN
Start: 2021-05-17 — End: ?

## 2021-05-17 MED ORDER — TRAZODONE 100 MG TABLET
ORAL_TABLET | Freq: Every evening | ORAL | 1 refills | 90.00000 days | Status: CP
Start: 2021-05-17 — End: 2021-08-15

## 2021-05-17 MED ORDER — LAMOTRIGINE 200 MG TABLET
ORAL_TABLET | Freq: Two times a day (BID) | ORAL | 1 refills | 90 days | Status: CP
Start: 2021-05-17 — End: 2021-08-15

## 2021-05-17 MED ORDER — BUPROPION HCL XL 150 MG 24 HR TABLET, EXTENDED RELEASE
ORAL_TABLET | Freq: Every morning | ORAL | 1 refills | 90 days | Status: CP
Start: 2021-05-17 — End: ?

## 2021-05-17 MED ORDER — GABAPENTIN 300 MG CAPSULE
ORAL_CAPSULE | Freq: Three times a day (TID) | ORAL | 2 refills | 60.00000 days
Start: 2021-05-17 — End: 2021-07-16

## 2021-05-17 NOTE — Unmapped (Signed)
Medication name and strength: traZODone (DESYREL) 100 MG tablet     Correct directions: Take 1.5 tablets (150 mg total) by mouth nightly for 90 doses  ??  Last provider and date patient seen: 02/17/2021 with Dayton Scrape  ??  Upcoming appointment with what provider: 05/18/2021 with Bartolo Darter  ??  Pharmacy: [facility]) [place], [place] - [address]   [address] [place] [address], [place] [address]   Phone:  [phone] ??Fax:  [phone]

## 2021-05-17 NOTE — Unmapped (Signed)
Medication name and strength: lamoTRIgine (LAMICTAL) 200 MG tablet     Correct directions: Take 1 tablet (200 mg total) by mouth Two (2) times a day    Medication name and strength: buPROPion (WELLBUTRIN XL) 150 MG 24 hr tablet     Correct directions: Take 1 tablet (150 mg total) by mouth every morning    Last provider and date patient seen: 02/17/2021 with Dayton Scrape    Upcoming appointment with what provider: 05/18/2021 with Bartolo Darter    Pharmacy: [facility]) [place], [place] - [address]   [address] [place] [address], [place] [address]   Phone:  [phone] ??Fax:  [phone]

## 2021-05-18 ENCOUNTER — Ambulatory Visit: Admit: 2021-05-18 | Payer: MEDICARE

## 2021-05-18 ENCOUNTER — Ambulatory Visit: Admit: 2021-05-18 | Discharge: 2021-05-19 | Payer: MEDICARE

## 2021-05-18 LAB — HM HEPATITIS C SCREENING LAB: HM Hepatitis Screen: NEGATIVE

## 2021-05-18 MED ORDER — VENLAFAXINE 100 MG TABLET
ORAL_TABLET | Freq: Every day | ORAL | 0 refills | 1.00000 days | Status: CP
Start: 2021-05-18 — End: ?

## 2021-05-18 MED ORDER — CLONAZEPAM 0.5 MG TABLET
ORAL_TABLET | Freq: Every evening | ORAL | 2 refills | 30.00000 days | Status: CN
Start: 2021-05-18 — End: ?

## 2021-05-18 MED ORDER — VENLAFAXINE ER 37.5 MG CAPSULE,EXTENDED RELEASE 24 HR
ORAL_CAPSULE | Freq: Every day | ORAL | 1 refills | 90 days | Status: CN
Start: 2021-05-18 — End: ?

## 2021-05-18 NOTE — Unmapped (Signed)
If you need to contact me before our next appointment, for non-urgent concerns, please call me at [phone]. For urgent mental health matters, or after-hours, weekends, or holidays, please call [phone]. For emergencies, please call 911 or go to your closest emergency room.    Please remember, regarding your appointment times:  - If for any reason you arrive 15 minutes later than your scheduled appointment time, you may not be seen and your visit may be rescheduled.  - If you need to cancel your appointment, we ask that you call [phone] at least 24 hours before your scheduled appointment.  - Please remember that we will not automatically reschedule missed appointments.  - If you miss two (2) appointments without letting us know in advance, you will likely be referred to a provider in your community.  - We will do our best to be on time.  Sometimes an emergency will arise that might cause your clinician to be late.  We will try to inform you of this when you check in for your appointment. If you wait more than 15 minutes past your appointment time without such notice, please speak with the front desk staff.  For more information and reminders regarding clinic policies (these were provided when you were admitted to the clinic), please ask the front desk.

## 2021-05-18 NOTE — Unmapped (Unsigned)
Chronic Pain Follow Up Note    Assessment and Plan    Caitlin Smith is a 65 y.o.  being followed at [facility] for complaint of chronic pain localized to right knee and bilateral neck, shoulders and upper back myofascial pain and fibromyalgia, and lower back 2/2 lumbar facet arthropathy and DDD and moderate degree of lumbar central spinal canal stenosis. Patient has a history of chronic pain related to lupus and arthritis and is on rheumatological therapy.     Bilateral shoulder pain  ***  -Voltaren gel TID  -if pain worsening, referral to ortho for possible shoulder injections    Polyneuropathy  - Continue gabapentin by one tablet weekly to goal of 600 mg TID     Chronic pain syndrome; Fibromyalgia  - Continue gabapentin to 600 mg TID  - Continue Flexeril 5 mg BID PRN  - Continue vanlafaxine per PCP  - Continue Trazodone 150 mg qhs per PCP  - Continue voltaren Gel  - Continue pain psychology.  - Continue activity as able    Chronic lower back pain:  ***  -Continue vanlafaxine, Flexeril as above.    Future considerations:  Genicular nerve block/RFA  Lumbar MBBs    Requested Prescriptions     Pending Prescriptions Disp Refills   ??? cyclobenzaprine (FLEXERIL) 5 MG tablet 60 tablet 2     Sig: Take 1 tablet (5 mg total) by mouth two (2) times a day as needed.   ??? gabapentin (NEURONTIN) 300 MG capsule 360 capsule 2     Sig: Take 2 capsules (600 mg total) by mouth Three (3) times a day.     No orders of the defined types were placed in this encounter.    Risks and benefits of above medications including but not limited to possibility of respiratory depression, sedation, and even death were discussed with the patient who expressed an understanding.     Urine toxicology screen is not due today.  Treatment agreement renewal is not due today.     HPI  Caitlin Smith is a 65 y.o. being followed at [facility] for complaint of chronic pain localized to right knee and bilateral neck, shoulder, upper back, and lower back 2/2 lumbar facet arthropathy and DDD. Patient has a history of chronic pain related to lupus, arthritis, and fibromyalgia.     At last visit in June, the patient reported likely multifactorial, myofascial pain in setting of fibromyalgia and OA (radiographs with moderate AC OA). Denied weakness, tingling, radiculopathic pain. She also reported chronic pain secondary to lumbar facet arthropathy and DDD and moderate degree of lumbar central spinal canal stenosis. Much improved with facet injections 4/22. Continued to have relief at last appointment. We continued medications without changes.     Today, ***    The patient's medication regimen:  Gabapentin to 600 mg TID  Cymbalta 120mg  am (PCP)  Trazodone 150 mg qhs (PCP)  Flexeril 5 mg BID prn  Voltaren gel prn    The patient states her pain is located *** and the severity of her pain ranges from ***/10 to ***/10.  Her pain currently is ***/10 and on average is ***/10.  She describes the sensation of her pain as {pain described:58928}. Her pain is present {pain frequency:58931} and worst {pain worst:59685}. The patient???s pain impacts {negatively affected:59709}. Her interval history includes {interval history:59710}. Her pain {pain changed:59711}, and she {does does not blank:47747} have new pain to discuss today. She {is/is not:36772} on  blood thinners or anti-coagulants. In regards to medications currently taken for pain management, the patient {is:54246::is} tolerating these medications well and complains of associated side effects: {pain med side effects:59712}.      Patient denies homicidal/suicidal ideation.     Medication Monitoring  [place]CCS[place] database was reviewed and it was appropriate.  Last urine toxicology screen: N/A, not on opioids  The patient did not bring pill bottles today, not on opioids      Imaging   MRI lumbar spine 10/28/20    Procedures  Bilateral Intra-articular Lumbar Facet Injections under Fluoroscopic Guidance at L4/5 and L5/S1with steroid 12/29/20      Allergies  Allergies   Allergen Reactions   ??? Vilazodone Swelling   ??? Ace Inhibitors Other (See Comments)     Pt states she can not take ace inhibitors. Pt states she can not remember her reaction.    ??? Bee Pollen Itching     COUGH, WATERY EYES   ??? Penicillin G Rash   ??? Penicillins Rash   ??? Pollen Extracts Itching     COUGHING, WATERY EYES       Home Medications    Current Outpatient Medications   Medication Sig Dispense Refill   ??? albuterol HFA 90 mcg/actuation inhaler Inhale 2 puffs every six (6) hours as needed for wheezing. 8 g 11   ??? amLODIPine (NORVASC) 2.5 MG tablet Take 3 tablets by mouth daily.     ??? amLODIPine (NORVASC) 5 MG tablet Take 1 tablet (5 mg total) by mouth daily. 90 tablet 3   ??? aspirin (ECOTRIN) 81 MG tablet Take 81 mg by mouth daily.     ??? atenoloL (TENORMIN) 25 MG tablet Take 25 mg by mouth daily.     ??? atorvastatin (LIPITOR) 40 MG tablet Take 40 mg by mouth daily.     ??? biotin 1 mg cap Take by mouth.     ??? buPROPion (WELLBUTRIN XL) 150 MG 24 hr tablet Take 1 tablet (150 mg total) by mouth every morning. 90 tablet 1   ??? calcipotriene (DOVONOX) 0.005 % ointment Apply every other day to areas of rash in folds as maintenance 60 g 2   ??? clobetasoL (TEMOVATE) 0.05 % ointment Apply topically nightly. 45 g 3   ??? clonazePAM (KLONOPIN) 0.5 MG tablet Take 0.5 tablets (0.25 mg total) by mouth nightly. 15 tablet 2   ??? clotrimazole-betamethasone (LOTRISONE) 1-0.05 % cream Apply 1 application topically Two (2) times a day.     ??? cyclobenzaprine (FLEXERIL) 5 MG tablet Take 1 tablet (5 mg total) by mouth two (2) times a day as needed. 60 tablet 2   ??? diclofenac sodium (VOLTAREN) 1 % gel Apply 2 g topically two (2) times a day as needed for arthritis. 300 g 5   ??? dicyclomine (BENTYL) 20 mg tablet Take 20 mg by mouth every six (6) hours.     ??? famotidine (PEPCID) 20 MG tablet Take 1 tablet (20 mg total) by mouth Two (2) times a day. 60 tablet 3   ??? FLOVENT HFA 110 mcg/actuation inhaler USE 2 PUFFS BY MOUTH TWO  TIMES DAILY 36 g 3   ??? fluticasone propionate (FLONASE) 50 mcg/actuation nasal spray      ??? gabapentin (NEURONTIN) 300 MG capsule Take 2 capsules (600 mg total) by mouth Three (3) times a day. 360 capsule 2   ??? hydrALAZINE (APRESOLINE) 10 MG tablet      ??? hydrOXYchloroQUINE (PLAQUENIL) 200 mg tablet Take 1 tablet (  200 mg total) by mouth Two (2) times a day. 180 tablet 3   ??? inhalational spacing device (AEROCHAMBER MV) Spcr 1 each by Miscellaneous route two (2) times a day. With Fovent 1 each 0   ??? ketoconazole (NIZORAL) 2 % cream APP EXT AA D  0   ??? lamoTRIgine (LAMICTAL) 200 MG tablet Take 1 tablet (200 mg total) by mouth Two (2) times a day. 180 tablet 1   ??? losartan (COZAAR) 50 MG tablet Take 50 mg by mouth in the morning.     ??? meclizine (ANTIVERT) 25 mg tablet Take 25 mg by mouth Three (3) times a day as needed.      ??? melatonin 10 mg Tab Take 1 tablet by mouth.     ??? metroNIDAZOLE (METROGEL) 0.75 % gel Apply one applicatorful (~37.5 mg metronidazole) intravaginally once daily for 5 days. 45 g 0   ??? mupirocin (BACTROBAN) 2 % ointment APP EXT IEN BID  0   ??? mycophenolate (MYFORTIC) 360 MG TbEC Take 1 tablet by mouth twice daily 180 tablet 3   ??? omeprazole (PRILOSEC) 40 MG capsule Take 40 mg by mouth daily.      ??? ondansetron (ZOFRAN) 4 MG tablet      ??? polyethylene glycol (GLYCOLAX) 17 gram/dose powder 2 cap fulls in a full glass of water, three times a day, for 5 days.     ??? pregabalin (LYRICA) 50 MG capsule      ??? telmisartan (MICARDIS) 80 MG tablet Take 80 mg by mouth.     ??? traZODone (DESYREL) 100 MG tablet Take 1.5 tablets (150 mg total) by mouth nightly for 90 doses. 135 tablet 1   ??? triamcinolone (KENALOG) 0.1 % ointment Apply twice a day to affected areas when rough, otherwise just on the weekends 454 g 1   ??? venlafaxine (EFFEXOR-XR) 37.5 MG 24 hr capsule Take 2 capsules (75 mg total) by mouth daily. 180 capsule 1     No current facility-administered medications for this visit.     ROS  General {ajlrosgen:46920}  Cardiovascular {aggROSCV:37303}  Gastrointestinal {aggROSGI:37304}  Skin {aggROSskin:37305}  Endocrine {aggROSendo:37306}  Musculoskeletal {aggROSmusk:37307}  Neurologic {aggROSneu:37308}  Psychiatric {aggROSpsych:37309}        Physical Exam    VITALS: There were no vitals filed for this visit.  Wt Readings from Last 3 Encounters:   04/29/21 (!) 106.6 kg (235 lb)   03/03/21 (!) 107 kg (236 lb)   02/09/21 (!) 107.7 kg (237 lb 6.4 oz)       GENERAL:  The patient is well developed, well-nourished and appears to be in no apparent distress. The patient is pleasant and interactive. Patient is a good historian.  HEENT:    Reveals normocephalic/atraumatic. Clear sclera. Mucous membranes are moist.  RESPIRATORY:   Normal work of breathing.  No supplemental oxygen.  GASTROINTESTINAL:   Soft, non-distended  NEUROLOGIC:    The patient was alert and oriented, speech fluent, normal language.   MUSCULOSKELETAL:    Motor function preserved in upper and lower extremities with normal tone and bulk. Good range of motion of extremities. The patient was able to ambulate without difficulty throughout the clinic today {With/without:5700} the assistance of a walking aid.    SKIN:   No obvious rashes, lesions, or erythema.  PSYCHIATRIC:  Appropriate mood and affect.  No pressured speech.      Documentation assistance was provided by Joline Salt, on May 18, 2021 at 2:55 AM for Galen Daft, FNP.    {***  NOTE TO PROVIDER: PLEASE ADD ATTESTATION NOTING YOU AGREE WITH SCRIBE DOCUMENTATION}
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
## 2021-05-19 MED ORDER — VENLAFAXINE 100 MG TABLET
ORAL_TABLET | Freq: Every day | ORAL | 1 refills | 30 days | Status: CP
Start: 2021-05-19 — End: 2021-07-18

## 2021-05-24 NOTE — Unmapped (Signed)
I interviewed the patient in person.  I discussed the case with the resident and agree with the assessment and plan as outlined in the note.

## 2021-05-25 ENCOUNTER — Other Ambulatory Visit: Payer: Self-pay

## 2021-05-25 ENCOUNTER — Encounter: Payer: Self-pay | Admitting: Emergency Medicine

## 2021-05-25 ENCOUNTER — Emergency Department
Admission: EM | Admit: 2021-05-25 | Discharge: 2021-05-25 | Disposition: A | Discharge status: Home / Self Care | Payer: Medicare Other | Attending: Emergency Medicine | Admitting: Emergency Medicine

## 2021-05-25 DIAGNOSIS — R0902 Hypoxemia: Secondary | ICD-10-CM | POA: Diagnosis not present

## 2021-05-25 DIAGNOSIS — N189 Chronic kidney disease, unspecified: Secondary | ICD-10-CM | POA: Insufficient documentation

## 2021-05-25 DIAGNOSIS — R0789 Other chest pain: Secondary | ICD-10-CM | POA: Diagnosis not present

## 2021-05-25 DIAGNOSIS — Z743 Need for continuous supervision: Secondary | ICD-10-CM | POA: Diagnosis not present

## 2021-05-25 DIAGNOSIS — R079 Chest pain, unspecified: Secondary | ICD-10-CM | POA: Diagnosis not present

## 2021-05-25 DIAGNOSIS — Z79899 Other long term (current) drug therapy: Secondary | ICD-10-CM | POA: Insufficient documentation

## 2021-05-25 DIAGNOSIS — M542 Cervicalgia: Secondary | ICD-10-CM | POA: Diagnosis not present

## 2021-05-25 DIAGNOSIS — I129 Hypertensive chronic kidney disease with stage 1 through stage 4 chronic kidney disease, or unspecified chronic kidney disease: Secondary | ICD-10-CM | POA: Diagnosis not present

## 2021-05-25 DIAGNOSIS — R21 Rash and other nonspecific skin eruption: Secondary | ICD-10-CM | POA: Insufficient documentation

## 2021-05-25 DIAGNOSIS — S161XXA Strain of muscle, fascia and tendon at neck level, initial encounter: Secondary | ICD-10-CM | POA: Diagnosis not present

## 2021-05-25 DIAGNOSIS — R6889 Other general symptoms and signs: Secondary | ICD-10-CM | POA: Diagnosis not present

## 2021-05-25 MED ORDER — PREDNISONE 10 MG PO TABS: 10.0000 mg | ORAL_TABLET | Freq: Every day | ORAL | 0 refills | Status: DC

## 2021-05-25 MED ORDER — CYCLOBENZAPRINE HCL 5 MG PO TABS: 5.0000 mg | ORAL_TABLET | Freq: Three times a day (TID) | ORAL | 0 refills | Status: DC | PRN

## 2021-05-25 NOTE — Discharge Instructions (Signed)
Please take Tylenol as needed for mild pain.  Take prednisone taper as described.  Take Flexeril as needed for muscle spasms.  Return to the ER for any worsening symptoms or urgent changes in health
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
## 2021-05-26 DIAGNOSIS — R490 Dysphonia: Principal | ICD-10-CM

## 2021-05-26 MED ORDER — OMEPRAZOLE 40 MG CAPSULE,DELAYED RELEASE
ORAL_CAPSULE | Freq: Every day | ORAL | 3 refills | 90 days | Status: CP
Start: 2021-05-26 — End: ?

## 2021-05-28 NOTE — Unmapped (Signed)
Dermatology Note     Assessment and Plan:      Inverse Psoriasis  - No active psoriasis today, but patient has ongoing itching and discomfort that prohibit her from using deodorant and bras. She has evidence of atrophy from steroid use.  - Start calcipotriene (DOVONOX) 0.005 % ointment; Apply topically daily. Use in skin folds and on the lower leg.  - Recommend only using triamcinolone a few days at a time for flares. Counseled on appropriate use of topical steroids. Discussed the common side effects of topical steroids including epidermal atrophy, striae formation, hypopigmentation, and acneiform rash.  -??Of note, she is not a good candidate for MTX given history of eosinophilic pneumonitis- rheumatology and pulmonology agree that this would not be a suitable option for her given her pulmonary history.  - Could consider adding an oral agent if she continues to flare, possibly Otezla (although has mood d/o).??    Hypopigmented linear patches on R leg  - looks post-inflammatory and secondary to excoriations, although patient denies having scratches  - recommend calcipotriene daily  - asked patient to let us know if worsens so we can re-evalute.    Benign Lesions/ Findings:   Dermatofibroma(s)  - Reassurance provided regarding the benign appearance of lesions noted on exam today; no treatment is indicated in the absence of symptoms/changes.    The patient was advised to call for an appointment should any new, changing, or symptomatic lesions develop.     RTC: Return in about 11 weeks (around 08/18/2021) for psoriasis, hypopigmentation. or sooner as needed   _________________________________________________________________      Chief Complaint     Chief Complaint   Patient presents with   ??? Psoriasis     Some improvement, wants to discuss about white strips on her right leg from being exposed to sun        HPI     Caitlin Smith is a 65 y.o. female who presents as a returning patient (last seen 05/27/2020) to [facility] for follow up of inverse psoriasis. At her last appt, she was advised to switch to TAC from halobetasol due to concern for atrophy. She uses TAC nightly under the breasts, arms, and belly. She endorses ongoing itching and discomfort that prohibit her from using deodorant and bras.     She has a spot on her LUE she wants looked at. It is asymptomatic and has been present for years.    She has linear white streaks on her R shin. These developed after a ball game where she had a lot of sun. When she was bathing, her legs were flaking and these lines appeared. They are asymptomatic. She hasn't been putting anything in this area.    The patient denies any other new or changing lesions or areas of concern.     Pertinent Past Medical History     Inverse psoriasis  SLE   Eosinophilic pneumonitis   She was initially??diagnosed in 2006 with SLE presenting with episcleritis and arthralgia with serology showing +ANA, +dsDNA. ??She also has stage I CKD felt secondary not due to SLE but due to HTN and obesity.     Past Medical History, Family History, Social History, Medication List, Allergies, and Problem List were reviewed in the rooming section of Epic.     ROS: Other than symptoms mentioned in the HPI, no fevers, chills, or other skin complaints    Physical Examination     GENERAL: Well-appearing female in no acute distress, resting  comfortably.  NEURO: Alert and oriented, answers questions appropriately  PSYCH: Normal mood and affect  RESP: No increased work of breathing  SKIN (Full Skin Exam): Examination of the face, eyelids, lips, nose, ears, neck, chest, abdomen, back, arms, legs, hands, feet, palms, soles, nails was performed  - Dermatofibroma(s): Firm dermal nodule(s) with a hyperpigmented halo and a positive dimple sign located on the LUE  - hypopigmented linear patches on the RLE   - atrophy of the axillae, pannus, and inframammary area about    All areas not commented on are within normal limits or unremarkable  - female chaperone present    (Approved Template 05/18/2020)

## 2021-05-31 ENCOUNTER — Institutional Professional Consult (permissible substitution): Admit: 2021-05-31 | Discharge: 2021-06-01 | Payer: MEDICARE | Attending: Psychologist | Primary: Psychologist

## 2021-05-31 DIAGNOSIS — F410 Panic disorder [episodic paroxysmal anxiety] without agoraphobia: Principal | ICD-10-CM

## 2021-05-31 DIAGNOSIS — F411 Generalized anxiety disorder: Principal | ICD-10-CM

## 2021-05-31 DIAGNOSIS — F4310 Post-traumatic stress disorder, unspecified: Principal | ICD-10-CM

## 2021-05-31 DIAGNOSIS — F331 Major depressive disorder, recurrent, moderate: Principal | ICD-10-CM
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
## 2021-06-02 ENCOUNTER — Ambulatory Visit
Admit: 2021-06-02 | Discharge: 2021-06-03 | Payer: MEDICARE | Attending: Student in an Organized Health Care Education/Training Program | Primary: Student in an Organized Health Care Education/Training Program

## 2021-06-02 DIAGNOSIS — L409 Psoriasis, unspecified: Secondary | ICD-10-CM | POA: Diagnosis not present

## 2021-06-02 DIAGNOSIS — D239 Other benign neoplasm of skin, unspecified: Secondary | ICD-10-CM | POA: Diagnosis not present

## 2021-06-02 DIAGNOSIS — L819 Disorder of pigmentation, unspecified: Secondary | ICD-10-CM | POA: Diagnosis not present

## 2021-06-02 MED ORDER — CALCIPOTRIENE 0.005 % TOPICAL OINTMENT
Freq: Every day | TOPICAL | 2 refills | 0.00000 days | Status: CP
Start: 2021-06-02 — End: 2022-06-02

## 2021-06-02 NOTE — Unmapped (Addendum)
It was nice to see you today! Your resident physician was Dr. Eustace Quail    Start calcipotriene daily to skin folds and on right lower leg.  If flaring, can use the triamcinolone for a couple days then go back to calcipotriene.    Left arm spot is a benign lesion (dermatofibroma).    If any of your medications are too expensive, look for a coupon at [email or web address] or reference the application on a smartphone  - Enter the medication name, size, and your zip code to find coupons for local pharmacies.   - Print a coupon and bring it to the pharmacy, or pull up the coupon on a smartphone.  - You can also call pharmacies to ask about the cost of your medication before you pick it up.  If you still cannot afford your medication, please let us know.     Please call our clinic at [phone] with any concerns or to schedule a follow up appointment. We look forward to seeing you again!    [facility] releases most results to you as soon as they are available. Therefore, you may see some results before we do. Please give [place] 2 business days to review the tests and contact you by phone or through MyChart. If you are concerned that some results may be upsetting or confusing, you may wish to wait until we contact you before looking at the report in MyChart. If you have an urgent question, you can send [place] a message or call our clinic. Otherwise, we prefer that you wait 2 business days for [place] to contact you.

## 2021-06-04 DIAGNOSIS — G6289 Other specified polyneuropathies: Principal | ICD-10-CM

## 2021-06-04 MED ORDER — GABAPENTIN 300 MG CAPSULE
ORAL_CAPSULE | 3 refills | 0 days
Start: 2021-06-04 — End: ?

## 2021-06-06 NOTE — Unmapped (Signed)
Otolaryngology Return Voice Visit    History of Present Illness:  [name] is a 65 y.o.  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   ??  At the end of our previous clinic visit we discussed and recommended clinical course of Respiratory Retraining, Biotene mouthwash, D/C Listerine- switch to salt water gargles   ??  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  ??  December 2018:??The patient notes that her symptoms have been relatively stable. ??She continues to complain of burning tongue.????Following this visit I recommended a thyroid hormone panel and thyroid ultrasound.  ??  August 2019:??The patient reports that she continues to clear there throat and cough in a consistent manner. ??Thyroid ultrasound was unremarkable w/o nodules. She occasionally (rarely ) awakens from sleep with cough. She has done a few sessions with SLP, but claims to have forgotten the techniques.  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  I additionally offered behavioral intervention for her cough symptom.  ??  April 2021: In the interim the patient is followed up with neurosurgery and has had radiography demonstrating no additional lesions.  The patient did however complain of right-sided head throbbing, and otalgia.  [name] notes that she has continued to have balance difficulty, intermittent cough and dysphonia.  Lastly, she reports that her throat has been burning more than in the past though she has not seen her GI physician for some time.  The patient reports that she has had ongoing right otalgia that she is treated with hydrogen peroxide.  She has had her ears flushed by her primary care physician without resolution.  The patient was referred to otology for right otalgia.    June 2022: Patient returns with concerns for weak vocal projection, worsened over the past several weeks. She attributes this to her LPR, but has been taking her daily Prilosec with minimal relief. Otherwise, she has been doing well, without any additional concerns. Given the stability of the patient's physical examination and findings referable to upper airway inflammation, I have a recommended that we proceed with management of her known LPR. Add Famotidine 20 mg BID to supplement Prilosec 40 mg    October 2022: The patient returns for follow-up. She states that her voice continues to be hoarse. The patient endorses seasonal allergies. She states that she follows up with pulmonology and was started on albuterol inhaler. She endorses coughing symptoms that has been a past ongoing issue. The patient states that she has the sensation of food getting caught in her epigastric region. She endorses abdominal pain, right under the ribcage, for the past 4-5 months and has not seen GI. She describes it as a dull pain that will last for a few minutes before going away. She states that the abdominal pain will occur randomly at rest and has generally not been benefited by reflux medication. She endorses difficulty with bowel movement, which has been an ongoing issue in the past.    Interval Surgery/Medical History:    Interval Past Medical History and ROS is otherwise unchanged except as per HPI      Medications:    Current Outpatient Medications:   ???  albuterol HFA 90 mcg/actuation inhaler, Inhale 2 puffs every six (6) hours as needed for wheezing., Disp: 8 g, Rfl: 11  ???  amLODIPine (NORVASC) 2.5 MG tablet, Take 3 tablets by mouth daily., Disp: , Rfl:   ???  amLODIPine (NORVASC) 5 MG tablet, Take 1 tablet (5 mg total) by mouth daily., Disp: 90 tablet, Rfl: 3  ???  aspirin (ECOTRIN) 81 MG tablet, Take 81 mg by mouth daily., Disp: , Rfl:   ???  atenoloL (TENORMIN) 25 MG tablet, Take 25 mg by mouth daily., Disp: , Rfl:   ???  atorvastatin (LIPITOR) 40 MG tablet, Take 40 mg by mouth daily., Disp: , Rfl:   ???  biotin 1 mg cap, Take by mouth., Disp: , Rfl:   ???  buPROPion (WELLBUTRIN XL) 150 MG 24 hr tablet, Take 1 tablet (150 mg total) by mouth every morning., Disp: 90 tablet, Rfl: 1  ???  calcipotriene (DOVONOX) 0.005 % ointment, Apply topically daily. Use in skin folds and on the lower leg., Disp: 120 g, Rfl: 2  ???  clobetasoL (TEMOVATE) 0.05 % ointment, Apply topically nightly., Disp: 45 g, Rfl: 3  ???  clonazePAM (KLONOPIN) 0.5 MG tablet, Take 0.5 tablets (0.25 mg total) by mouth nightly., Disp: 15 tablet, Rfl: 2  ???  clotrimazole-betamethasone (LOTRISONE) 1-0.05 % cream, Apply 1 application topically Two (2) times a day., Disp: , Rfl:   ???  cyclobenzaprine (FLEXERIL) 5 MG tablet, Take 1 tablet (5 mg total) by mouth two (2) times a day as needed., Disp: 60 tablet, Rfl: 2  ???  diclofenac sodium (VOLTAREN) 1 % gel, Apply 2 g topically two (2) times a day as needed for arthritis., Disp: 300 g, Rfl: 5  ???  dicyclomine (BENTYL) 20 mg tablet, Take 20 mg by mouth every six (6) hours., Disp: , Rfl:   ???  famotidine (PEPCID) 20 MG tablet, Take 1 tablet (20 mg total) by mouth Two (2) times a day., Disp: 60 tablet, Rfl: 3  ???  FLOVENT HFA 110 mcg/actuation inhaler, USE 2 PUFFS BY MOUTH TWO  TIMES DAILY, Disp: 36 g, Rfl: 3  ???  fluticasone propionate (FLONASE) 50 mcg/actuation nasal spray, , Disp: , Rfl:   ???  gabapentin (NEURONTIN) 300 MG capsule, Take 2 capsules (600 mg total) by mouth Three (3) times a day., Disp: 360 capsule, Rfl: 2  ???  hydrALAZINE (APRESOLINE) 10 MG tablet, , Disp: , Rfl:   ???  hydrOXYchloroQUINE (PLAQUENIL) 200 mg tablet, Take 1 tablet (200 mg total) by mouth Two (2) times a day., Disp: 180 tablet, Rfl: 3  ???  inhalational spacing device (AEROCHAMBER MV) Spcr, 1 each by Miscellaneous route two (2) times a day. With [employer], Disp: 1 each, Rfl: 0  ???  ketoconazole (NIZORAL) 2 % cream, APP EXT AA D, Disp: , Rfl: 0  ???  lamoTRIgine (LAMICTAL) 200 MG tablet, Take 1 tablet (200 mg total) by mouth Two (2) times a day., Disp: 180 tablet, Rfl: 1  ???  losartan (COZAAR) 50 MG tablet, Take 50 mg by mouth in the morning., Disp: , Rfl:   ???  meclizine (ANTIVERT) 25 mg tablet, Take 25 mg by mouth Three (3) times a day as needed. , Disp: , Rfl:   ???  melatonin 10 mg Tab, Take 1 tablet by mouth., Disp: , Rfl:   ???  metroNIDAZOLE (METROGEL) 0.75 % gel, Apply one applicatorful (~37.5 mg metronidazole) intravaginally once daily for 5 days., Disp: 45 g, Rfl: 0  ???  mupirocin (BACTROBAN) 2 % ointment, APP EXT IEN BID, Disp: , Rfl: 0  ???  mycophenolate (MYFORTIC) 360 MG TbEC, Take 1 tablet by mouth twice daily, Disp: 180 tablet, Rfl: 3  ???  omeprazole (PRILOSEC) 40 MG capsule, Take 1 capsule (40 mg total) by mouth daily., Disp: 90 capsule, Rfl: 3  ???  ondansetron (ZOFRAN) 4 MG tablet, , Disp: , Rfl:   ???  polyethylene glycol (GLYCOLAX) 17 gram/dose powder, 2 cap fulls in a full glass of water, three times a day, for 5 days., Disp: , Rfl:   ???  pregabalin (LYRICA) 50 MG capsule, , Disp: , Rfl:   ???  telmisartan (MICARDIS) 80 MG tablet, Take 80 mg by mouth., Disp: , Rfl:   ???  traZODone (DESYREL) 100 MG tablet, Take 1.5 tablets (150 mg total) by mouth nightly for 90 doses., Disp: 135 tablet, Rfl: 1  ???  triamcinolone (KENALOG) 0.1 % ointment, Apply twice a day to affected areas when rough, otherwise just on the weekends, Disp: 454 g, Rfl: 1  ???  venlafaxine (EFFEXOR) 100 MG tablet, Take 1 tablet (100 mg total) by mouth in the morning., Disp: 30 tablet, Rfl: 1         Pertinent ROS as above and per HPI.     Physical Exam:   Constitutional:  Vitals reviewed on nursing chart.  Voice: Rough dysphonia  Respiration:  Breathing comfortably, no stridor.   Ears:  Normal tympanic membranes to otoscopy,   Nose:  External nose midline, anterior rhinoscopy is normal with limited visualization just to the anterior interior turbinate.   Oral Cavity/Oropharynx/Lips:  Normal mucous membranes, normal floor of mouth/tongue/oropharynx, no masses or lesions are noted. Missing posterior mandibular premolars and molars  Pharyngeal Walls:  No masses noted.  Neck/Lymph:  No lymphadenopathy, no thyroid masses.      Procedure: 06/08/2021     Endoscopy Type:  Flexible Fiberoptic Videostroboscopy    Flexible endoscope serial number [id number] used today by Lawrence Marseilles, MD    Indications/TimeOut:  To better evaluate the patient???s symptoms, fiberoptic videostroboscopy is indicated.   A time out identifying the patient, the procedure, the location of the procedure and any concerns was performed prior to beginning the procedure.    Procedure Details:    The patient was placed in the sitting position.  After topical anesthesia and decongestion with oxymetazoline and lidocaine, the flexible laryngoscope was passed.  The microphone was used to trigger the xenon stroboscopic light source.      -  Hypopharynx - There were no lesions in the pyriformis, epiglottis, or base of tongue.  -  Larynx -  there was mild interarytenoid edema, no erythema.  -  Vocal Folds:                 Supraglottis: Normal     Infraglottis/Subglottis:  Normal     Mobility: Right VF hypomobility     Amplitude: Symmetric bilaterally     Mucosal Wave: Mucosal wave intact bilaterally     Closure: Complete     Lesions/Findings: MTD    Condition:  Stable.  Patient tolerated procedure well.    Complications: None        Voice- Related Quality of Life (VR-QOL) Measure:  I have trouble speaking loudly or being heard in noisy situations: 2  I run out of air and need to take frequent breaths when talking: 4  I sometimes do not know what will come out when I begin speaking: 3  I am sometimes anxious or frustrated because of my voice: 3  I sometimes get depressed because of my voice: 3  I have trouble using the telephone because of my voice: 2  I have trouble doing my job or practicing my profession because of my voice: 1  I avoid going out socially because of my voice: 1  I have to repeat myself to be understood: 3  I have become less outgoing because of my voice: 1  VRQOL Raw Score: 23  Calculated Score: 67.5     Glottal Function Index:  Speaking took extra effort: 2  Throat discomfort or pain after using your voice: 2  Vocal fatigue (voice weakened as you talked): 2  Voice cracks or sounds different: 0  Glottal Function Index Total: 6     Dyspnea Index:  I have trouble getting air in: 2  I feel tightness in my throat when I am having my breathing problem: 2  It takes more effort to breathe than it used to: 2  Changes in weather affect my breathing problem: 2  My breathing gets worse with stress: 3  I make sound/noise breathing in : 2  I have to strain to breathe: 2  My shortness of breath gets worse with exercise or physical activity: 2  My breathing problem makes me feel stressed: 2  My breathing problem causes me to restrict my personal and social life: 2  Dyspnea Index Total Score: 21      Assessment:   65 y.o. 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   ??  At the end of our previous clinic visit we discussed and recommended clinical course of Respiratory Retraining, Biotene mouthwash, D/C Listerine- switch to salt water gargles   ??  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  ??  December 2018:??The patient notes that her symptoms have been relatively stable. ??She continues to complain of burning tongue.????Following this visit I recommended a thyroid hormone panel and thyroid ultrasound.  ??  August 2019:??The patient reports that she continues to clear there throat and cough in a consistent manner. ??Thyroid ultrasound was unremarkable w/o nodules. She occasionally (rarely ) awakens from sleep with cough. She has done a few sessions with SLP, but claims to have forgotten the techniques.  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  I additionally offered behavioral intervention for her cough symptom.  ??  April 2021: In the interim the patient is followed up with neurosurgery and has had radiography demonstrating no additional lesions.  The patient did however complain of right-sided head throbbing, and otalgia.  Ms. Tomasso notes that she has continued to have balance difficulty, intermittent cough and dysphonia.  Lastly, she reports that her throat has been burning more than in the past though she has not seen her GI physician for some time.  The patient reports that she has had ongoing right otalgia that she is treated with hydrogen peroxide.  She has had her ears flushed by her primary care physician without resolution.  The patient was referred to otology for right otalgia.    June 2022: Patient returns with concerns for weak vocal projection, worsened  over the past several weeks. She attributes this to her LPR, but has been taking her daily Prilosec with minimal relief. Otherwise, she has been doing well, without any additional concerns. Given the stability of the patient's physical examination and findings referable to upper airway inflammation, I have a recommended that we proceed with management of her known LPR. Add Famotidine 20 mg BID to supplement Prilosec 40 mg    October 2022: The patient returns for follow-up. She states that her voice continues to be hoarse. The patient endorses seasonal allergies. She states that she follows up with pulmonology and was started on albuterol inhaler. She endorses coughing symptoms that has been a past ongoing issue. The patient states that she has the sensation of food getting caught in her epigastric region. She endorses abdominal pain, right under the ribcage, for the past 4-5 months and has not seen GI. She describes it as a dull pain that will last for a few minutes before going away. She states that the abdominal pain will occur randomly at rest and has generally not been benefited by reflux medication. She endorses difficulty with bowel movement, which has been an ongoing issue in the past.    1. LPR  2. Slightly enlarged right Thyroid gland/ no dominant nodule  3. Burning mouth syndrome??   4. Cough   5. MTD  6. Epigastric pain     I reassured the patient that her larynx appears healthy on laryngoscopy today. I recommend NeilMed nasal saline irrigation for general nasopharyngeal hygiene. I will refer the patient to GI for further evaluation of gastric discomfort and constipation.    Plan:  1. Referral to GI for gastric discomfort and constipation    2. Recommend NeilMed nasal saline irrigation for general nasopharyngeal hygiene    3. Follow-up with me in 4 months for interval evaluation      The patient in agreement the plan as articulated above.    Scribe's Attestation: Suzan Nailer, MD obtained and performed the history, physical exam and medical decision making elements that were entered into the chart. Signed by Sherlie Ban, Scribe, on June 08, 2021 at 9:36 AM.      This note was created with [employer] and may have errors that were not dictated and not seen in editing.     Elana Alm  Dru Primeau

## 2021-06-07 NOTE — Unmapped (Addendum)
Main clinic: [phone]  Office fax #: [phone]    For appointments call [phone] option 2    Nursing questions contact Dr. Atha Starks nurse, Guss Bunde LPN  at [phone]    For surgical scheduling, call [name] Alar [phone]    Emergency after hours, please call [phone] and ask to speak with the ENT physician on call.     [facility]  Otolaryngology- Head and Neck Surgery Patient Instruction Sheet    BUFFERED ISOTONIC SALINE NASAL IRRIGATION    The Benefits:    1. When you irrigate, the isotonic saline (salt water) acts as a solvent and washes the mucus crusts and other debris from your nose.    2. This decongests and improves the airflow into your nose.  The sinus passages begin to open.    3. Studies have also shown that a salt water and an alkaline (baking soda) irrigation solution improves nasal membrane cell function (mucociliary flow of mucus debris).    The Instructions:  The easiest way to perform these irrigations is with a NeilMed Sinus Rinse Bottle.  This is available at nearly all major drug stores and grocery stores.  You will need to purchase the bottle as well as the premixed packets.    You should plan to irrigate your nose with buffered isotonic saline 2 times per day.  Many people prefer to warm the solution slightly in the microwave - but be sure that the solution is NOT HOT.  Stand over the sink (some do this in the shower) and squirt the solution into each side of your nose, keeping your mouth open. This allows you to spit the saltwater out of your mouth.  It will not harm you if you swallow a little.    If you have been told to use a nasal steroid such as Flonase, Nasonex, or Nasacort, you should always use isortonic saline solution first, then use your nasal steroid product.  The nasal steroid is much more effective when sprayed onto clean nasal membranes and the steroid medicine will reach deeper into the nose.    Most people experience a little burning sensation the first few times they use a isotonic saline solution, but this usually goes away within a few days.

## 2021-06-08 ENCOUNTER — Ambulatory Visit: Admit: 2021-06-08 | Discharge: 2021-06-09 | Payer: MEDICARE | Attending: Otolaryngology | Primary: Otolaryngology

## 2021-06-08 DIAGNOSIS — R1013 Epigastric pain: Secondary | ICD-10-CM | POA: Diagnosis not present

## 2021-06-08 DIAGNOSIS — R490 Dysphonia: Secondary | ICD-10-CM | POA: Diagnosis not present

## 2021-06-08 DIAGNOSIS — K5900 Constipation, unspecified: Secondary | ICD-10-CM | POA: Diagnosis not present

## 2021-06-08 MED ORDER — VENLAFAXINE 100 MG TABLET
ORAL_TABLET | 11 refills | 0 days | Status: CP
Start: 2021-06-08 — End: ?

## 2021-06-15 ENCOUNTER — Ambulatory Visit (INDEPENDENT_AMBULATORY_CARE_PROVIDER_SITE_OTHER): Payer: Medicare Other | Admitting: Family Medicine

## 2021-06-15 ENCOUNTER — Encounter: Payer: Self-pay | Admitting: Family Medicine

## 2021-06-15 ENCOUNTER — Other Ambulatory Visit: Payer: Self-pay

## 2021-06-15 VITALS — BP 126/74 | HR 82 | Temp 98.5°F | Resp 16 | Ht 67.0 in | Wt 239.0 lb

## 2021-06-15 DIAGNOSIS — Z79899 Other long term (current) drug therapy: Secondary | ICD-10-CM | POA: Diagnosis not present

## 2021-06-15 DIAGNOSIS — E236 Other disorders of pituitary gland: Secondary | ICD-10-CM

## 2021-06-15 DIAGNOSIS — Z23 Encounter for immunization: Secondary | ICD-10-CM | POA: Diagnosis not present

## 2021-06-15 DIAGNOSIS — G9519 Other vascular myelopathies: Secondary | ICD-10-CM | POA: Diagnosis not present

## 2021-06-15 DIAGNOSIS — M3213 Lung involvement in systemic lupus erythematosus: Secondary | ICD-10-CM

## 2021-06-15 DIAGNOSIS — J302 Other seasonal allergic rhinitis: Secondary | ICD-10-CM

## 2021-06-15 DIAGNOSIS — F339 Major depressive disorder, recurrent, unspecified: Secondary | ICD-10-CM

## 2021-06-15 DIAGNOSIS — J3089 Other allergic rhinitis: Secondary | ICD-10-CM

## 2021-06-15 DIAGNOSIS — R110 Nausea: Secondary | ICD-10-CM | POA: Diagnosis not present

## 2021-06-15 DIAGNOSIS — R739 Hyperglycemia, unspecified: Secondary | ICD-10-CM | POA: Diagnosis not present

## 2021-06-15 DIAGNOSIS — E785 Hyperlipidemia, unspecified: Secondary | ICD-10-CM

## 2021-06-15 DIAGNOSIS — I10 Essential (primary) hypertension: Secondary | ICD-10-CM | POA: Diagnosis not present

## 2021-06-15 DIAGNOSIS — Z8639 Personal history of other endocrine, nutritional and metabolic disease: Secondary | ICD-10-CM | POA: Diagnosis not present

## 2021-06-15 DIAGNOSIS — M797 Fibromyalgia: Secondary | ICD-10-CM

## 2021-06-15 DIAGNOSIS — J8410 Pulmonary fibrosis, unspecified: Secondary | ICD-10-CM

## 2021-06-15 DIAGNOSIS — D849 Immunodeficiency, unspecified: Secondary | ICD-10-CM

## 2021-06-15 MED ORDER — FLUTICASONE PROPIONATE 50 MCG/ACT NA SUSP
2.0000 | Freq: Every day | NASAL | 3 refills | Status: DC
Start: 2021-06-15 — End: 2023-03-01
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
## 2021-06-16 LAB — HEMOGLOBIN A1C
Hgb A1c MFr Bld: 5.5 % of total Hgb (ref <5.7)
Mean Plasma Glucose: 111 mg/dL
eAG (mmol/L): 6.2 mmol/L

## 2021-06-16 LAB — LIPID PANEL
Cholesterol: 154 mg/dL (ref <200)
HDL: 62 mg/dL (ref >=50)
LDL Cholesterol (Calc): 75 mg/dL (calc)
Non-HDL Cholesterol (Calc): 92 mg/dL (calc) (ref <130)
Total CHOL/HDL Ratio: 2.5 (calc) (ref <5.0)
Triglycerides: 89 mg/dL (ref <150)

## 2021-06-16 LAB — COMPLETE METABOLIC PANEL WITH GFR
AG Ratio: 1.1 (calc) (ref 1.0–2.5)
ALT: 13 U/L (ref 6–29)
AST: 20 U/L (ref 10–35)
Albumin: 3.6 g/dL (ref 3.6–5.1)
Alkaline phosphatase (APISO): 72 U/L (ref 37–153)
BUN: 15 mg/dL (ref 7–25)
CO2: 32 mmol/L (ref 20–32)
Calcium: 9.4 mg/dL (ref 8.6–10.4)
Chloride: 102 mmol/L (ref 98–110)
Creat: 1 mg/dL (ref 0.50–1.05)
Globulin: 3.4 g/dL (calc) (ref 1.9–3.7)
Glucose, Bld: 83 mg/dL (ref 65–99)
Potassium: 4.7 mmol/L (ref 3.5–5.3)
Sodium: 140 mmol/L (ref 135–146)
Total Bilirubin: 0.3 mg/dL (ref 0.2–1.2)
Total Protein: 7 g/dL (ref 6.1–8.1)
eGFR: 63 mL/min/{1.73_m2} (ref >=60)

## 2021-06-22 MED ORDER — FAMOTIDINE 20 MG TABLET
ORAL_TABLET | Freq: Two times a day (BID) | ORAL | 11 refills | 30 days | Status: CP
Start: 2021-06-22 — End: 2021-08-21

## 2021-06-28 ENCOUNTER — Institutional Professional Consult (permissible substitution): Admit: 2021-06-28 | Discharge: 2021-06-29 | Payer: MEDICARE | Attending: Psychologist | Primary: Psychologist

## 2021-06-28 DIAGNOSIS — F4310 Post-traumatic stress disorder, unspecified: Principal | ICD-10-CM

## 2021-06-28 DIAGNOSIS — F411 Generalized anxiety disorder: Principal | ICD-10-CM

## 2021-06-28 DIAGNOSIS — F331 Major depressive disorder, recurrent, moderate: Principal | ICD-10-CM

## 2021-06-28 DIAGNOSIS — F410 Panic disorder [episodic paroxysmal anxiety] without agoraphobia: Principal | ICD-10-CM
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
## 2021-07-04 NOTE — Unmapped (Signed)
Pulmonary Clinic - Follow-up Visit      HISTORY:     Active Pulmonary Problems & Brief History:  Caitlin Smith is a 65 y.o. woman with many medical issues here for follow up of follow up for organizing pneumonia .     Interval History:  - 2016 and prior - patient followed at [facility]. They thought her symptoms of dyspnea were from obesity and asthma and recommended ENT evaluation. They also stated concern for chronic aspiration.   - 04/2016 - patient in [facility] system. She was thought to have multifactorial dyspnea, waxing and waning pulmonary opacities. Evaluated by pulmonary hypertension team without evidence of pulmonary hypertension. Underwent bronchoscopy with BAL and transbronchial biopsies which showed significant inflammation. DDX remained broad - CEP, COP, SLE pneumonitis, ABPA, EGPA, AEP, but it was decided to treat her with prolonged steroid taper.   - 08/23/16 - last visit with Dr. Johnnette Barrios - was improving with steroid taper   - 10/20/16 - established with Dr. Nilsa Nutting - there had been question of compliance with her prednisone since last visit, but by time of visit she had been on it consistently for one month. No clear improvement in dyspnea, but remains inactive.   - 02/09/17 - follow up - spirometry showed improvement in FVC and DLCOafter 2 months of steroid, had tapered off by time of visit. When she stopped the prednisone she developed increased cough, chest tightness, dyspnea.   - 03/27/17 - follow up - given steroid dependence was placed on MMF.   - 10/16/17 - follow up - dose reduced MMF for GI side effects. Prolonged steroid taper over this time.   - 02/06/2019 - last visit with pulmonary, Dr. Judeth Horn - at that time had gone to local ED for chest pain, CT chest showed interstitial lung disease in the lung bases, favored to reflect mild progression of chronic fibrotic nonspecific interstitial pneumonia (NSIP). It was unclear to Dr. Judeth Horn if that represented actual progression of her underlying disease.   - 03/03/21 - patient reports she has dyspnea on exertion not associated with chest pain. Not currently using any inhalers. Still has some chronic cough, but ENT thinks she's having reflux so has her on PPI + H2 blocker. Cough non-productive. No F/C/NS, N/V/D, upset tummy. Sleeps laying flat, no nocturnal awakenings. Reports negative sleep studies for OSA. No LE edema. Wanted to repeat PFTs in 4 months given mildly reduced compared to prior. If stable/improved at next visit then would reach out to rheum to see about weaning MMF.   - 06/08/21 - ENT - larynx healthy on laryngoscopy. Referred to GI for GI discomfort and constipation.   - 07/04/21 - patient really increased exercise since last visit, walking 50 minutes a day most days up until a couple weeks ago when her breathing got significantly worse again. Now having a lot of dyspnea where she feels like she needs her albuterol with even relatively minimal exertion. Doesn't lay flat, so hard to say if she has orthopnea.     Past Medical History: The medical and surgical history were personally reviewed and updated in the patient's electronic medical record. Pertinent positives are documented above.  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 Pertinent positives are documented above.  Home Medications: Medications were reviewed and updated in the patient's electronic medical record. Pertinent positives are documented above.   Allergies: Allergies were reviewed and updated in the patient's electronic medical record. Pertinent positives are documented above.  Review of Systems: A comprehensive review of systems was completed and negative except as noted in HPI.    PHYSICAL EXAM:     There were no vitals filed for this visit.    General: pleasant individual appearing stated age, alert and oriented, no acute distress  HEENT: trachea midline, supple  CV: RRR, no m/r/g  Lungs: CTAB, no increased work of breathing  Abd: Soft, NT/ND, no rebound or guarding  Ext: Warm, well perfused, no peripheral edema  Skin: No rashes, skin breakdown, or wounds  Neuro: CN II-XII intact to conversation, alert and oriented.     LABORATORY and RADIOLOGY DATA:     Pulmonary Function Tests/Interpretation:  07/05/21      No obstruction. Normal inspiratory flow volume loops. Moderate restriction. Moderate reduction in DLCO. DLCO reduced from prior, FEV1 and FVC unchanged.     Pertinent Laboratory Data:  04/25/16 BAL and TBBX   Cultures negative except for OPF     Cell count  PMN 31%  Lymph 11%  Macs 43%  Eos 7%  Baso 6%     Pathology   A: Lung, right lower lobe, endobronchial biopsy   - DIP-like reaction pattern with admixed eosinophils, consistent with eosinophilic pneumonia (see Micro Exam)  - Patchy organizing pneumonia  - Interstitial lymphoplasmacytic infiltrate, polytypic  - Partially denuded benign respiratory mucosa, without viral cytopathic effect  - No malignant neoplasm identified   - GMS and  AFB stains negative for fungi or AFB    Cytology  Lung, Bronchial lavage:  - No malignant cells identified.   - Alveolar macrophages and mixed inflammation.     Pertinent Imaging Data:  07/11/2017 CT Chest  AIRWAYS, LUNGS, PLEURA: Nearly resolved multifocal and bilateral perihilar bronchovascular nodular and consolidative opacities compared to 02/06/2017. Residual patchy right basilar ground-glass opacities persist.  New 3.2 cm superior segment right lower lobe curvilinear consolidation (image 242, series 5).  Subpleural right middle lobe cysts unchanged, may represent honeycombing.  No pleural effusion.    IMPRESSION:  Resolved multifocal, bilateral lung consolidations compared to 02/06/2017.  New right lower lobe curvilinear consolidation, indeterminate, but favor new focus of organizing pneumonia.    02/06/17 CT Chest  AIRWAYS, LUNGS, PLEURA:   Clear central airways. No bronchiectasis or bronchial wall thickening.   Previously seen peripheral pulmonary parenchymal opacities, improved from previous study but there are new peripheral/pleural-based pulmonary parenchymal opacities, predominantly in the upper lobes.  No focal air-trapping or honeycombing.   No pleural effusion.    -Waxing and waning pulmonary parenchymal opacities. Favor cryptogenic organizing pneumonia/eosinophilic pneumonia. Can't rule out multilobar pneumonia completely. Recommend 3-6 month follow-up chest CT to document resolution.    09/09/16 HRCT Chest  Clear central airways. No bronchiectasis or bronchial wall thickening.   Interval increase in bilateral lower lobe predominant patchy multifocal consolidations. Similar appearance of subpleural reticulation at the lung bases.  No focal air-trapping or honeycombing.  No pleural effusion.    --Increase in lower lobe predominant patchy multifocal consolidations. Favor  organizing pneumonia versus eosinophilic pneumonia.  --Similar appearance of bibasilar subpleural articulation, likely mild fibrosis.        Pertinent Cardiac Data:  04/14/16 TTE   ?? Left ventricular hypertrophy - mild  ?? Normal left ventricular systolic function, ejection fraction 55 to 60%  ?? Diastolic dysfunction - grade I (normal filling pressures)  ?? Normal right ventricular systolic function      ASSESSMENT and PLAN     Problem List  Cryptogenic Organizing Pneumonia (COP)  SLE   GERD  BMI 37   Left hemidiaphragm elevation    Assessment  Patient with DLCO that is further reduced from prior with worsened symptoms acutely over the past weeks. Will investigate further with CT chest, . Diaphragm elevation raises concern for shrinking lung syndrome, will get chest fluoroscopy to better evaluate.     For now continue same immunosuppression given having acute issues.     Plan  - CT chest to re-evaluate parenchyma  -  - Chest fluoroscopy     The patient was seen with Dr. Normajean Glasgow and will return to clinic in 2 months.    Layla Maw MD  Pulmonary and Critical Care Fellow

## 2021-07-05 ENCOUNTER — Ambulatory Visit: Admit: 2021-07-05 | Discharge: 2021-07-05 | Payer: MEDICAID

## 2021-07-05 DIAGNOSIS — R0600 Dyspnea, unspecified: Secondary | ICD-10-CM | POA: Diagnosis not present

## 2021-07-05 DIAGNOSIS — J986 Disorders of diaphragm: Secondary | ICD-10-CM | POA: Diagnosis not present

## 2021-07-05 DIAGNOSIS — I10 Essential (primary) hypertension: Secondary | ICD-10-CM | POA: Diagnosis not present

## 2021-07-05 DIAGNOSIS — L409 Psoriasis, unspecified: Secondary | ICD-10-CM | POA: Diagnosis not present

## 2021-07-05 DIAGNOSIS — G8929 Other chronic pain: Secondary | ICD-10-CM | POA: Diagnosis not present

## 2021-07-05 DIAGNOSIS — K219 Gastro-esophageal reflux disease without esophagitis: Secondary | ICD-10-CM | POA: Diagnosis not present

## 2021-07-05 DIAGNOSIS — M797 Fibromyalgia: Secondary | ICD-10-CM | POA: Diagnosis not present

## 2021-07-05 DIAGNOSIS — R942 Abnormal results of pulmonary function studies: Secondary | ICD-10-CM | POA: Diagnosis not present

## 2021-07-05 DIAGNOSIS — J45909 Unspecified asthma, uncomplicated: Secondary | ICD-10-CM | POA: Diagnosis not present

## 2021-07-05 DIAGNOSIS — Z79899 Other long term (current) drug therapy: Secondary | ICD-10-CM | POA: Diagnosis not present

## 2021-07-05 DIAGNOSIS — J84116 Cryptogenic organizing pneumonia: Principal | ICD-10-CM

## 2021-07-05 MED ORDER — FLUTICASONE PROPIONATE 220 MCG/ACTUATION HFA AEROSOL INHALER
Freq: Two times a day (BID) | RESPIRATORY_TRACT | 11 refills | 0.00000 days | Status: CP
Start: 2021-07-05 — End: 2022-07-05

## 2021-07-05 NOTE — Unmapped (Unsigned)
Pt called for triage, no answer. I have left a voicemail with instructions to call office.

## 2021-07-05 NOTE — Unmapped (Signed)
Hi Caitlin Smith,    It was great meeting you today. Here is what we talked about.    1) Shortness of Breath  - You obviously have a history of significant lung disease in the Cryptogenic Organizing Pneumonia (COP)  - You also have a bit of worsening of your lung function on your breathing tests as we discussed  - Let's work on getting to the bottom of that with the following tests.    1) CT scan of chest  You can schedule your CT scan by calling the following numbers:  [facility] and [employer] - [phone]   [facility] - [phone]    2) Sniff test of chest - this looks at the diaphragm   You can schedule your CT scan by calling the following numbers:  [facility] [employer] - [phone]   [facility] - [phone]    3) Six minute walk test - this is to assess if you need oxygen when you're exerting yourself  - Can also be done at [facility] - [phone]     Please return to clinic in 2 months.     If you have any non-urgent questions or concerns please reach out by calling the clinic or via MyChart.    Thanks,  Layla Maw MD  Pulmonary and Critical Care Fellow

## 2021-07-06 ENCOUNTER — Ambulatory Visit: Admit: 2021-07-06 | Discharge: 2021-07-07 | Payer: MEDICARE | Attending: Medical | Primary: Medical

## 2021-07-06 DIAGNOSIS — R1013 Epigastric pain: Secondary | ICD-10-CM | POA: Diagnosis not present

## 2021-07-06 DIAGNOSIS — K5900 Constipation, unspecified: Secondary | ICD-10-CM | POA: Diagnosis not present

## 2021-07-06 MED ORDER — LINZESS 145 MCG CAPSULE
ORAL_CAPSULE | Freq: Every day | ORAL | 3 refills | 30 days | Status: CP
Start: 2021-07-06 — End: ?

## 2021-07-06 NOTE — Unmapped (Signed)
DIVISION OF GASTROENTEROLOGY AND HEPATOLOGY  [email or web address]      You were seen by Tomma Lightning MPAS, PA-C for   Problem List Items Addressed This Visit    None       Visit Diagnoses       Constipation, unspecified constipation type        Relevant Orders    Colonoscopy    Pelvic Floor Biofeedback    Epigastric discomfort                 It was my pleasure seeing you today in the [facility] Gastroenterology department. As we discussed today during your appointment, I have recommended the following:    Constipation   Pelvic floor dyssynergia  Colonoscopy - [phone]  Follow up in 3 months      It is important that you remain active in your healthcare.  If further work-up, tests or consultations are recommended, you should be contacted to get these scheduled.  If for any reason, you do not hear from someone within 7 days to get these scheduled you should reach out to me via [facility] MyChart or at one of the numbers below and together we'll ensure you get the appropriate appointments.       Notes and/or results from recommended tests or consultation are to be sent to me as the ordering provider.  After I receive these results, I will follow-up with you as appropriate by [facility] MyChart, phone, letter or at your scheduled follow-up clinic appointment.  If for whatever reason you do not hear from me within 7-10 days after you've completed any tests/work-up, but do require further recommendations and/or instructions based on the results prior to a scheduled follow-up clinic visit, please contact me.     Please do not hesitate to reach out to me if you have questions or concerns in the interim.      Mychart is a convenient modality to reach your provider, however this is not meant to be for emergencies and is not checked on a daily basis.  If you have urgent questions, please call my nurse, Hershal Coria, at [phone].     Faylene Million Lance Bosch, PA-C  [facility]  [facility]  [address]  [place], [place] [address]  Phone: [phone]  Fax: [phone]     Hershal Coria, MSN, RN, NPD-BC, CCRN-K: [phone]   Fax: [phone]     Important phone numbers:    GI Clinic Appointments and GI Procedure Appointments:  [phone]  To schedule, reschedule, or cancel your GI clinic or procedure appointment, please call [phone]. If you are unable to come to an appointment, please notify us as soon as possible, preferably 24 hours in advance. This allows other patients with urgent needs to be cared for quicker.     Radiology: [phone], option 3 or 4     For emergencies after normal business hours or on weekends/holidays   If you are experiencing a medical emergency or need urgent medical care, call 911 immediately.    For GI medical needs after hours or on holidays, please call [phone] and ask to speak to the Gastroenterology Fellow on call.    Forms, Letters, Medical Records: [phone]   [email or web address]    FINANCIAL ASSISTANCE:   [email or web address]   [phone] (toll-free)   [phone]          Click Here to Visit [facility] Covid-19 Vaccine Hub  Get  the latest facts on the COVID-19 vaccines.      Or visit [facility]???s COVID-19 Vaccine Hub at [email or web address] to review the latest facts about the vaccines.

## 2021-07-06 NOTE — Unmapped (Signed)
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 disorder Neg Hx    ??? Dementia Neg Hx    ??? Depression Neg Hx    ??? Drug abuse Neg Hx    ??? OCD Neg Hx    ??? Paranoid behavior Neg Hx    ??? Physical abuse Neg Hx    ??? Schizophrenia Neg Hx    ??? Seizures Neg Hx    ??? Sexual abuse Neg Hx    ??? Colon cancer Neg Hx    ??? Endometrial cancer Neg Hx    ??? Ovarian cancer Neg Hx    ??? BRCA 1/2 Neg Hx    ??? Melanoma Neg Hx    ??? Basal cell carcinoma Neg Hx    ??? Squamous cell carcinoma Neg Hx            VITAL SIGNS:    BP 122/74  - Pulse 61  - Ht 170.2 cm (5' 7)  - Wt (!) 107.5 kg (237 lb)  - BMI 37.12 kg/m??     PHYSICAL EXAM:  CONSTITUTIONAL: Well developed, well-nourished female in no acute distress.   EYES: conjunctivae clear; no lid lesions; pupils equal round, reactive to light; sclerae anicteric.  ENT: +mask.  NECK:  symmetric, supple.  RESPIRATORY: normal respiratory effort.  CARDIOVASCULAR: regular rate and rhythm, normal S1, S2, no murmurs, rubs, gallops; no pedal edema.  RECTAL: deferred  SKIN: no rashes, lesions  LYMPHATIC: no cervical, submandibular, or supraclavicular adenopathy.  MUSCULOSKELETAL: normal gait and station; no clubbing or cyanosis.  PSYCHIATRIC: awake, alert, and oriented to time, place, and person; insight and judgment adequate; mood and affect congruent.        This note has been created using [employer]. The note has been reviewed for accuracy, however errors may not always be identified. Such creation errors do NOT reflect on the standard of medical care rendered to this patient.

## 2021-07-07 MED ORDER — CLONAZEPAM 0.5 MG TABLET
ORAL_TABLET | Freq: Every evening | ORAL | 2 refills | 30 days | Status: CP
Start: 2021-07-07 — End: ?

## 2021-07-07 NOTE — Unmapped (Signed)
Medication name and strength: clonazePAM (KLONOPIN) 0.5 MG tablet     Correct directions: Take 0.5 tablets (0.25 mg total) by mouth nightly    Last provider and date patient seen: 12/16/2020 with Dayton Scrape    Upcoming appointment with what provider: 07/20/2021 with Bartolo Darter    Pharmacy: [id number] [place], [place] - [address] AT [facility] OF [address]   [address] [place], [place] [address]   Phone:  [phone] ??Fax:  [phone]

## 2021-07-07 NOTE — Unmapped (Signed)
I saw and evaluated the patient, participating in the key portions of the service.  I reviewed the resident???s note.  I agree with the resident???s findings and plan.     Ms. Conger presents for follow up for COPD and worsening dyspnea. Will get CT chest, to further assess. Prior imaging has shown significant elevation of L hemidiaphragm, so will assess unilateral diaphragm weakness.    Graciella Belton, MD

## 2021-07-14 ENCOUNTER — Encounter: Payer: Self-pay | Admitting: Family Medicine

## 2021-07-14 ENCOUNTER — Ambulatory Visit: Payer: Self-pay | Admitting: *Deleted

## 2021-07-14 ENCOUNTER — Other Ambulatory Visit: Payer: Self-pay

## 2021-07-14 ENCOUNTER — Ambulatory Visit
Admission: RE | Admit: 2021-07-14 | Discharge: 2021-07-14 | Disposition: A | Discharge status: Home / Self Care | Payer: Medicare Other | Source: Ambulatory Visit | Attending: Family Medicine | Admitting: Family Medicine

## 2021-07-14 ENCOUNTER — Ambulatory Visit (INDEPENDENT_AMBULATORY_CARE_PROVIDER_SITE_OTHER): Payer: Medicare Other | Admitting: Family Medicine

## 2021-07-14 ENCOUNTER — Ambulatory Visit
Admission: RE | Admit: 2021-07-14 | Discharge: 2021-07-14 | Disposition: A | Discharge status: Home / Self Care | Payer: Medicare Other | Attending: Family Medicine | Admitting: Family Medicine

## 2021-07-14 VITALS — BP 132/78 | HR 96 | Temp 98.3°F | Resp 16 | Ht 67.0 in | Wt 241.0 lb

## 2021-07-14 DIAGNOSIS — L299 Pruritus, unspecified: Secondary | ICD-10-CM

## 2021-07-14 DIAGNOSIS — M79644 Pain in right finger(s): Secondary | ICD-10-CM

## 2021-07-14 DIAGNOSIS — Z23 Encounter for immunization: Secondary | ICD-10-CM | POA: Diagnosis not present

## 2021-07-14 DIAGNOSIS — M25461 Effusion, right knee: Secondary | ICD-10-CM

## 2021-07-14 DIAGNOSIS — T148XXA Other injury of unspecified body region, initial encounter: Secondary | ICD-10-CM

## 2021-07-14 DIAGNOSIS — M7989 Other specified soft tissue disorders: Secondary | ICD-10-CM | POA: Diagnosis not present

## 2021-07-14 DIAGNOSIS — Z9181 History of falling: Secondary | ICD-10-CM | POA: Diagnosis not present

## 2021-07-14 MED ORDER — HYDROXYZINE HCL 10 MG PO TABS: 10.0000 mg | ORAL_TABLET | Freq: Three times a day (TID) | ORAL | 0 refills | Status: DC | PRN

## 2021-07-14 MED ORDER — DICLOFENAC SODIUM 75 MG PO TBEC: 75.0000 mg | DELAYED_RELEASE_TABLET | Freq: Two times a day (BID) | ORAL | 0 refills | Status: DC

## 2021-07-14 NOTE — Addendum Note (Signed)
Addended by: Carlene Coria on: 07/14/2021 12:50 PM   Modules accepted: Orders

## 2021-07-14 NOTE — Telephone Encounter (Signed)
Patient reports she fell this morning while walking and hit her head, knee and hand on the concrete. Patient states she has scrapes and swelling- no active bleeding. Patient advised per wound care protocol- call to office for appointment to assess her injuries and patient may need Td booster.

## 2021-07-14 NOTE — Telephone Encounter (Signed)
Reason for Disposition  [1] Last tetanus shot > 5 years ago AND [2] DIRTY cut or scrape  Answer Assessment - Initial Assessment Questions 1. MECHANISM: "How did the injury happen?" For falls, ask: "What height did you fall from?" and "What surface did you fall against?"      Walking and tripped- R side of face hit pavement 2. ONSET: "When did the injury happen?" (Minutes or hours ago)      This morning 3. NEUROLOGIC SYMPTOMS: "Was there any loss of consciousness?" "Are there any other neurological symptoms?"      No loss of consciousness, no 4. MENTAL STATUS: "Does the person know who they are, who you are, and where they are?"      Aware/alert 5. LOCATION: "What part of the head was hit?"      R side of head 6. SCALP APPEARANCE: "What does the scalp look like? Is it bleeding now?" If Yes, ask: "Is it difficult to stop?"      Cut on forehead and bruising on R side of face- scrape 7. SIZE: For cuts, bruises, or swelling, ask: "How large is it?" (e.g., inches or centimeters)      Scrape on R forehead, swelling R fifth digit, R knee scraped 8. PAIN: "Is there any pain?" If Yes, ask: "How bad is it?"  (e.g., Scale 1-10; or mild, moderate, severe)     No pain 9. TETANUS: For any breaks in the skin, ask: "When was the last tetanus booster?"     2011 10. OTHER SYMPTOMS: "Do you have any other symptoms?" (e.g., neck pain, vomiting)       no 11. PREGNANCY: "Is there any chance you are pregnant?" "When was your last menstrual period?"       na  Protocols used: Head Injury-A-AH

## 2021-07-14 NOTE — Progress Notes (Signed)
Name: Nicole Ballard   MRN: [id number]    DOB: [birth year]   Date:07/14/2021       Progress Note  Subjective  Chief Complaint  Fall   HPI  Fall: she was walking on a side walk today and tripped, fell forward , she sat up and when feel back down, could not get her balance right away. She remembers falling and trying to catch herself. She did not lose consciousness She scrapped the right side of forehead, right hand has been painful and swollen and scrapped right knee and is also swollen. She is due for Tdap. She took one dose of Tylenol and denies a headache  Pruritus: she has noticed itching all over her body , usually when she goes to bed at night, taking benadryl prn. Symptoms are intermittent, no rashes associated with it. Explained it may be secondary to anxiety, we will try low dose hydroxizine and stop benadryl. Advised to discuss it with her psychiatrist also    Patient Active Problem List   Diagnosis Date Noted   Immunocompromised ([place]) 04/29/2021   Hyperglycemia 10/23/2017   Vertigo 09/29/2016   Bronchiolitis obliterans organizing pneumonia ([place]) 09/29/2016   Neck pain, musculoskeletal 12/15/2015   Abnormal CAT scan 04/11/2015   Auditory impairment 04/11/2015   Insomnia, persistent 04/11/2015   Chronic kidney disease (CKD), stage I 04/11/2015   Chronic nonmalignant pain 04/11/2015   Chronic constipation 04/11/2015   Dyslipidemia 04/11/2015   Fibromyalgia 04/11/2015   Gastro-esophageal reflux disease without esophagitis 04/11/2015   Dysphonia 04/11/2015   Low back pain 04/11/2015   Eczema intertrigo 04/11/2015   Keratosis pilaris 04/11/2015   Chronic recurrent major depressive disorder ([place]) 27/25/3664   Dysmetabolic syndrome 40/34/7425   Neurogenic claudication ([place]) 04/11/2015   Perennial allergic rhinitis with seasonal variation 04/11/2015   Abnormal presence of protein in urine 04/11/2015   Seborrhea capitis 04/11/2015   Moderate dysplasia of vulva 04/11/2015    Vitamin D deficiency 04/11/2015   Spinal stenosis of lumbar region 04/11/2015   Treadmill stress test negative for angina pectoris 03/05/2015   Shortness of breath 05/20/2014   Asthma, chronic 12/31/2013   H/O total knee replacement 12/31/2013   Episcleritis 09/26/2013   Vocal cord dysfunction 05/28/2013   Anxiety, generalized 03/19/2013   Back pain 12/18/2012   Pulmonary nodules 11/26/2012   Lung nodule, multiple 11/26/2012   Arthritis, degenerative 11/01/2012   H/O aspiration pneumonitis 10/22/2012   Postinflammatory pulmonary fibrosis ([place]) 10/22/2012   Mixed incontinence 05/04/2012   Lung involvement in systemic lupus erythematosus ([place]) 11/05/2010   Chronic nausea 07/01/2008   Benign hypertension 01/25/2005    Past Surgical History:  Procedure Laterality Date   BRAIN SURGERY     CHOLECYSTECTOMY     VAGINAL HYSTERECTOMY      Family History  Problem Relation Age of Onset   Breast cancer Mother    Cancer Mother 35       Breast   Cancer Father 80       Stomach   Breast cancer Maternal Aunt     Social History   Tobacco Use   Smoking status: Never   Smokeless tobacco: Never  Substance Use Topics   Alcohol use: No    Alcohol/week: 0.0 standard drinks     Current Outpatient Medications:    albuterol (VENTOLIN HFA) 108 (90 Base) MCG/ACT inhaler, SMARTSIG:2 Puff(s) By Mouth Every 6 Hours PRN, Disp: , Rfl:    amLODipine (NORVASC) 2.5 MG tablet, TAKE 3 TABLETS BY MOUTH  DAILY,  Disp: 270 tablet, Rfl: 0   aspirin EC 81 MG tablet, Take 1 tablet (81 mg total) by mouth daily., Disp: 30 tablet, Rfl: 0   atenolol (TENORMIN) 25 MG tablet, Take 1 tablet (25 mg total) by mouth daily., Disp: 90 tablet, Rfl: 3   atorvastatin (LIPITOR) 40 MG tablet, TAKE 1 TABLET BY MOUTH  DAILY, Disp: 90 tablet, Rfl: 2   Biotin 1 MG CAPS, Take 1 capsule by mouth daily., Disp: 30 capsule, Rfl: 0   buPROPion (WELLBUTRIN XL) 150 MG 24 hr tablet, Take 150 mg by mouth every morning., Disp: , Rfl:     Cholecalciferol (VITAMIN D) 2000 UNITS CAPS, Take 1 capsule by mouth daily., Disp: , Rfl:    clonazePAM (KLONOPIN) 0.5 MG tablet, Take 0.5 mg by mouth daily., Disp: , Rfl:    clotrimazole-betamethasone (LOTRISONE) cream, Apply 1 application topically 2 (two) times daily., Disp: 45 g, Rfl: 0   cyclobenzaprine (FLEXERIL) 5 MG tablet, Take 1-2 tablets (5-10 mg total) by mouth 3 (three) times daily as needed for muscle spasms., Disp: 20 tablet, Rfl: 0   diclofenac sodium (VOLTAREN) 1 % GEL, APP 2 GRAMS EXT AA QID, Disp: , Rfl: 6   diclofenac Sodium (VOLTAREN) 1 % GEL, Apply topically., Disp: , Rfl:    famotidine (PEPCID) 20 MG tablet, Take 20 mg by mouth 2 (two) times daily., Disp: , Rfl:    fluticasone (FLONASE) 50 MCG/ACT nasal spray, Place 2 sprays into both nostrils daily., Disp: 48 g, Rfl: 3   gabapentin (NEURONTIN) 300 MG capsule, Take 300 mg (1 capsule) in the morning and at 300 mg (1 capsule) at noon and 600 mg (2 capsules) at night., Disp: , Rfl:    hydrALAZINE (APRESOLINE) 10 MG tablet, TAKE 1 TABLET BY MOUTH THREE TIMES DAILY AS NEEDED FOR BLOOD PRESSURE ABOVE 150/90, Disp: 90 tablet, Rfl: 2   hydroxychloroquine (PLAQUENIL) 200 MG tablet, Take by mouth 2 (two) times daily., Disp: , Rfl:    lamoTRIgine (LAMICTAL) 200 MG tablet, Take 200 mg by mouth 2 (two) times daily., Disp: , Rfl:    LINZESS 145 MCG CAPS capsule, Take 145 mcg by mouth daily., Disp: , Rfl:    loratadine (CLARITIN) 10 MG tablet, Take 1 tablet (10 mg total) by mouth daily., Disp: 90 tablet, Rfl: 1   Melatonin 10 MG TABS, Take 1 tablet by mouth at bedtime., Disp: , Rfl:    mycophenolate (MYFORTIC) 360 MG TBEC EC tablet, Take 360 mg by mouth 2 (two) times daily., Disp: , Rfl:    olopatadine (PATANOL) 0.1 % ophthalmic solution, Place 1 drop into both eyes 2 (two) times daily., Disp: 5 mL, Rfl: 1   omeprazole (PRILOSEC) 40 MG capsule, TAKE 1 CAPSULE BY MOUTH  DAILY, Disp: 90 capsule, Rfl: 3   Polyethylene Glycol 3350 (MIRALAX PO),  Take by mouth., Disp: , Rfl:    pregabalin (LYRICA) 50 MG capsule, , Disp: , Rfl:    telmisartan (MICARDIS) 80 MG tablet, TAKE 1 TABLET BY MOUTH  DAILY, Disp: 90 tablet, Rfl: 0   traZODone (DESYREL) 100 MG tablet, Take 150 mg by mouth at bedtime., Disp: , Rfl:    triamcinolone ointment (KENALOG) 0.1 %, , Disp: , Rfl:    venlafaxine XR (EFFEXOR-XR) 37.5 MG 24 hr capsule, Take 37.5 mg by mouth 2 (two) times daily., Disp: , Rfl:   Allergies  Allergen Reactions   Ace Inhibitors     Other reaction(s): OTHER Pt states she can not take ace inhibitors.  Pt states she can not remember her reaction.    Bee Pollen    Penicillins    Pollen Extract     I personally reviewed active problem list, medication list, allergies with the patient/caregiver today.   ROS  Ten systems reviewed and is negative except as mentioned in HPI   Objective  Vitals:   07/14/21 1146  BP: 132/78  Pulse: 96  Resp: 16  Temp: 98.3 F (36.8 C)  SpO2: 96%  Weight: 241 lb (109.3 kg)  Height: $Remove'5\' 7"'kkoEtMS$  (1.702 m)    Body mass index is 37.75 kg/m.  Physical Exam  Constitutional: Patient appears well-developed and well-nourished. Obese  No distress.  HEENT: head atraumatic, normocephalic, pupils equal and reactive to light, neck supple Cardiovascular: Normal rate, regular rhythm and normal heart sounds.  1 plus systolic murmur heard. No BLE edema. Pulmonary/Chest: Effort normal and breath sounds normal. No respiratory distress. Abdominal: Soft.  There is no tenderness. Muscular skeletal: right 5th finger swollen, very tender with movement of MCP joint and also with palpation of MCP of 5 th finger, right knee has an effusion laterally but normal rom Skin: excoriation of right knee, right temporal area and area of abrasion on right forehead above brow  Psychiatric: Patient has a normal mood and affect. behavior is normal. Judgment and thought content normal.   Recent Results (from the past 2160 hour(s))  HM DIABETES  FOOT EXAM     Status: None   Collection Time: 05/16/21 12:00 AM  Result Value Ref Range   HM Diabetic Foot Exam 9     Comment: monofilament/House Calls/Impaired rating  HM HEPATITIS C SCREENING LAB     Status: None   Collection Time: 05/18/21 12:00 AM  Result Value Ref Range   HM Hepatitis Screen Negative-Validated   Lipid panel     Status: None   Collection Time: 06/15/21  8:39 AM  Result Value Ref Range   Cholesterol 154 <200 mg/dL   HDL 62 > OR = 50 mg/dL   Triglycerides 89 <150 mg/dL   LDL Cholesterol (Calc) 75 mg/dL (calc)    Comment: Reference range: <100 . Desirable range <100 mg/dL for primary prevention;   <70 mg/dL for patients with CHD or diabetic patients  with > or = 2 CHD risk factors. Marland Kitchen LDL-C is now calculated using the Martin-Hopkins  calculation, which is a validated novel method providing  better accuracy than the Friedewald equation in the  estimation of LDL-C.  Cresenciano Genre et al. Annamaria Helling. 0623;762(83): 2061-2068  (http://education.[employer].com/faq/FAQ164)    Total CHOL/HDL Ratio 2.5 <5.0 (calc)   Non-HDL Cholesterol (Calc) 92 <130 mg/dL (calc)    Comment: For patients with diabetes plus 1 major ASCVD risk  factor, treating to a non-HDL-C goal of <100 mg/dL  (LDL-C of <70 mg/dL) is considered a therapeutic  option.   COMPLETE METABOLIC PANEL WITH GFR     Status: None   Collection Time: 06/15/21  8:39 AM  Result Value Ref Range   Glucose, Bld 83 65 - 99 mg/dL    Comment: .            Fasting reference interval .    BUN 15 7 - 25 mg/dL   Creat 1.00 0.50 - 1.05 mg/dL   eGFR 63 > OR = 60 mL/min/1.64m2    Comment: The eGFR is based on the CKD-EPI 2021 equation. To calculate  the new eGFR from a previous Creatinine or Cystatin C result, go to https://www.kidney.org/professionals/ kdoqi/gfr%5Fcalculator  BUN/Creatinine Ratio NOT APPLICABLE 6 - 22 (calc)   Sodium 140 135 - 146 mmol/L   Potassium 4.7 3.5 - 5.3 mmol/L   Chloride 102 98 - 110 mmol/L    CO2 32 20 - 32 mmol/L   Calcium 9.4 8.6 - 10.4 mg/dL   Total Protein 7.0 6.1 - 8.1 g/dL   Albumin 3.6 3.6 - 5.1 g/dL   Globulin 3.4 1.9 - 3.7 g/dL (calc)   AG Ratio 1.1 1.0 - 2.5 (calc)   Total Bilirubin 0.3 0.2 - 1.2 mg/dL   Alkaline phosphatase (APISO) 72 37 - 153 U/L   AST 20 10 - 35 U/L   ALT 13 6 - 29 U/L  Hemoglobin A1c     Status: None   Collection Time: 06/15/21  8:39 AM  Result Value Ref Range   Hgb A1c MFr Bld 5.5 <5.7 % of total Hgb    Comment: For the purpose of screening for the presence of diabetes: . <5.7%       Consistent with the absence of diabetes 5.7-6.4%    Consistent with increased risk for diabetes             (prediabetes) > or =6.5%  Consistent with diabetes . This assay result is consistent with a decreased risk of diabetes. . Currently, no consensus exists regarding use of hemoglobin A1c for diagnosis of diabetes in children. . According to American Diabetes Association (ADA) guidelines, hemoglobin A1c <7.0% represents optimal control in non-pregnant diabetic patients. Different metrics may apply to specific patient populations.  Standards of Medical Care in Diabetes(ADA). .    Mean Plasma Glucose 111 mg/dL   eAG (mmol/L) 6.2 mmol/L     PHQ2/9: Depression screen [facility] 2/9 07/14/2021 06/15/2021 02/04/2021 02/02/2021 12/14/2020  Decreased Interest 0 $RemoveBe'2 1 3 1  'vEQVHiLcq$ Down, Depressed, Hopeless 0 $RemoveBe'1 1 2 1  'lzdQtYRaq$ PHQ - 2 Score 0 $Remov'3 2 5 2  'ymcaIk$ Altered sleeping - 1 0 1 1  Tired, decreased energy - 0 0 1 3  Change in appetite - 0 - 0 3  Feeling bad or failure about yourself  - 0 0 0 1  Trouble concentrating - 3 1 0 1  Moving slowly or fidgety/restless - 0 0 0 0  Suicidal thoughts - 0 0 0 0  PHQ-9 Score - $Remov'7 3 7 11  'lXGNNL$ Difficult doing work/chores - - - Not difficult at all -  Some recent data might be hidden    phq 9 is negative   Fall Risk: Fall Risk  07/14/2021 06/15/2021 02/04/2021 02/02/2021 12/14/2020  Falls in the past year? 1 1 0 0 0  Number falls in past yr: 1 0  0 0 0  Comment - - - - -  Injury with Fall? 1 0 0 0 0  Risk for fall due to : No Fall Risks No Fall Risks - - -  Risk for fall due to: Comment - - - - -  Follow up Falls prevention discussed Falls prevention discussed - - -      Functional Status Survey: Is the patient deaf or have difficulty hearing?: Yes Does the patient have difficulty seeing, even when wearing glasses/contacts?: No Does the patient have difficulty concentrating, remembering, or making decisions?: No Does the patient have difficulty walking or climbing stairs?: Yes Does the patient have difficulty dressing or bathing?: No Does the patient have difficulty doing errands alone such as visiting a doctor's office or shopping?: No    Assessment & Plan  1. History of recent fall   2. Skin excoriation  - diclofenac (VOLTAREN) 75 MG EC tablet; Take 1 tablet (75 mg total) by mouth 2 (two) times daily.  Dispense: 20 tablet; Refill: 0  3. Knee effusion, right  - diclofenac (VOLTAREN) 75 MG EC tablet; Take 1 tablet (75 mg total) by mouth 2 (two) times daily.  Dispense: 20 tablet; Refill: 0  4. Finger pain, right  - diclofenac (VOLTAREN) 75 MG EC tablet; Take 1 tablet (75 mg total) by mouth 2 (two) times daily.  Dispense: 20 tablet; Refill: 0 - DG Hand Complete Right; Future  5. Generalized pruritus  - hydrOXYzine (ATARAX/VISTARIL) 10 MG tablet; Take 1 tablet (10 mg total) by mouth 3 (three) times daily as needed.  Dispense: 30 tablet; Refill: 0

## 2021-07-14 NOTE — Unmapped (Signed)
Colonoscopy  Procedure #1      0  Procedure #2      [id number]  MRN      0  Endoscopist      FALSE  Urgent procedure      FALSE  Do you take: Plavix (clopidogrel), Coumadin (warfarin), Lovenox (enoxaparin), Pradaxa (dabigatran), Effient (prasugrel), Xarelto (rivaroxaban), Eliquis (apixaban), Pletal (cilostazol), or Brilinta (ticagrelor)?    FALSE  Do you have hemophilia, von Willebrand disease, thrombocytopenia?      FALSE  Do you have a pacemaker or implanted cardiac defibrillator?      FALSE  Are you pregnant?      FALSE  Has a No Name GI provider specified the location(s)?        Which location(s) did the [facility] provider specify?      FALSE     [facility]      FALSE     [place]      FALSE     [facility]      FALSE     [facility]      FALSE  Is procedure indication for variceal banding (this does NOT include variceal screening)?      FALSE  Have you been diagnosed with sleep apnea or do you wear a CPAP machine at night?      5  Height (feet)      7  Height (inches)      237  Weight (pounds)      37.1  BMI              FALSE  Do you have chronic kidney disease?      TRUE  Do you have chronic constipation or have you had poor quality bowel preps for past colonoscopies?      FALSE  Do you have Crohn's disease or ulcerative colitis?      FALSE  Have you had weight loss surgery?              FALSE  Are you in the process of scheduling or awaiting results of a heart ultrasound, stress test, or catheterization to evaluate new or worsening chest pain, dizziness, or shortness of breath?     FALSE  When you walk around your house or grocery store, do you have to stop and rest due to shortness of breath, chest pain, or light-headedness?      FALSE  Have you had a heart attack, stroke or heart stent placement within the past 6 months?      FALSE  Do you ever use supplemental oxygen?      FALSE  Have you been hospitalized for cirrhosis of the liver or heart failure in the last 12 months?      FALSE  Have you been treated for mouth or throat cancer with radiation or surgery?      FALSE  Have you been told that it is difficult for doctors to insert a breathing tube in you during anesthesia?      FALSE  Have you had a heart or lung transplant?              FALSE  Are you on dialysis?      FALSE  Do you have cirrhosis of the liver?      FALSE  Do you have myasthenia gravis?      FALSE  Is the patient a prisoner?              FALSE  Are  you younger than 30?      TRUE  Have you previously received propofol sedation administered by an anesthesiologist for a GI procedure?      FALSE  Do you drink an average of more than 3 drinks of alcohol per day?      FALSE  Do you regularly take suboxone or any prescription medications for chronic pain?      FALSE  Do you regularly take Ativan, Klonopin, Xanax, Valium, lorazepam, clonazepam, alprazolam, or diazepam?      FALSE  Have you previously had difficulty with sedation during a GI procedure?      FALSE  Have you been diagnosed with PTSD?      FALSE  Are you allergic to fentanyl or midazolam (Versed)?      FALSE  Do you take medications for HIV?      ************************ ************************ ************************ ************************   MRN:          [id number]      Anticoag Review:  No      Nurse Triage:  No      GI Clinic Consult:  No      Procedure(s):  Colonoscopy 0     [facility]  [facility]  [place]      Endoscopist:  0      Urgent:            No       Prep:               Extended Bowel Prep-Prep Pool              ************************ ************************ ************************ ************************

## 2021-07-15 ENCOUNTER — Ambulatory Visit: Payer: Self-pay | Admitting: *Deleted

## 2021-07-15 DIAGNOSIS — S62616A Displaced fracture of proximal phalanx of right little finger, initial encounter for closed fracture: Secondary | ICD-10-CM | POA: Diagnosis not present

## 2021-07-15 NOTE — Telephone Encounter (Signed)
Pt called in with a question however she hung up or the line was disconnected before she was transferred to me by the agent.    I attempted to call her back but her voicemail was full.  She then called back in.   She is calling to verify where she is supposed to go for her finger.   She saw Dr. Ancil Boozer yesterday and she told me to go some place to get it readjusted.    I went to [facility] yesterday and this morning and they told me I wasn't supposed to be there.  A nurse from the office called me yesterday and told me my finger was fractured and that I needed to go some where but I don't know where I'm supposed to go.  I called into [facility] to find out where she is supposed to go.  There was nothing mentioned in her chart about a referral or that she needed to go to [facility].   She needs to go to [facility].   [place]   Across from the [facility].   I got back on line with pt and let her know the address and that she needed to turn down a side street right across from the [facility].   She is going to put the address into her phone and go from there.    "I'll find it".   I instructed her to call us back if she got lost.   She thanked me for my help.       Reason for Disposition  [1] Caller requesting NON-URGENT health information AND [2] PCP's office is the best resource  Answer Assessment - Initial Assessment Questions 1. REASON FOR CALL or QUESTION: "What is your reason for calling today?" or "How can I best help you?" or "What question do you have that I can help answer?"     Pt needing to know where to go to get her fractured finger adjusted.   [facility] does not do it which is where she went.  Protocols used: Information Only Call - No Triage-A-AH

## 2021-07-20 ENCOUNTER — Telehealth: Admit: 2021-07-20 | Discharge: 2021-07-21 | Payer: MEDICARE

## 2021-07-20 DIAGNOSIS — S62616A Displaced fracture of proximal phalanx of right little finger, initial encounter for closed fracture: Secondary | ICD-10-CM | POA: Diagnosis not present

## 2021-07-20 MED ORDER — VENLAFAXINE 100 MG TABLET
ORAL_TABLET | Freq: Every day | ORAL | 11 refills | 30.00000 days | Status: CP
Start: 2021-07-20 — End: 2021-07-20

## 2021-07-20 NOTE — Unmapped (Signed)
[facility]  Psychiatry   Established Patient E&M Service - Outpatient       Assessment:    Caitlin Smith presents for follow-up evaluation.     Not doing well after fall. Discussed that medications can contribute to falls. Has evaluation w/ ortho soon for broken pinky. Still concerned about klonopin taper. Will decrease trazodone today from 150 mg to 100 mg.     Identifying Information:    Caitlin Smith is a 65 y.o. female with a history of  MDD, GAD, and Panic Disorder in the context of many chronic medical conditions including chronic pain/fibromyalgia, osteoarthritis with degenerative disc disease, Lupus, pituitary tumor (benign), s/p right total knee arthroplasty, and VIN II (s/p resection), who presents for follow-up evaluation. She has been maintained on Lamictal for many years as well as cymbalta, trazodone, klonopin, and wellbutrin. Patient has previously tolerated a slight taper in her klonopin from 0.75 mg to 0.25 mg qHS overtime. Her mood is often exacerbated by steroid use for current treatment of pulmonary disease. Patient is engaged with CBT therapy at the pain clinic.    Past medications:  Cymbalta    Risk Assessment:  An assessment of suicide and violence risk factors was performed as part of this evaluation and is not significantly changed from the last visit. While future psychiatric events cannot be accurately predicted, the patient does not currently require acute inpatient psychiatric care and does not currently meet [facility] involuntary commitment criteria.      Plan:    Problem: Major depression, Anxiety NOS, Panic Disorder   Status of problem: chronic with mild exacerbation  Interventions:   -- Continue Lamictal 400mg  qhs for mood  -- Continue Effexor 100 mg daily   -- Continue Wellbutrin XL 150mg  qAM   -- Continue Clonazepam 0.25mg  at bedtime  Last PDMP Review: 08/02/2021  2:57 PM  -- Continue CBT at pain clinic    Problem: Insomnia   Status of problem: chronic and stable  Interventions:   Last sleep study performed in 2017. Per chart review, showed mild OSA likely related to medication use. Neurology recommends patient avoid sleeping on back as she likely has OSA when sleeping in this position  -- Continue melatonin 3-5mg  at bedtime   -- DECREASE trazodone 100mg  qHS for insomnia (d5/4/21, d 02/05/20, i9/8,)  -- Clonazepam per above    Problem: High Risk Medication monitoring   Status of problem:  Stable   Interventions:   The complexity of this patient's care involves drug therapy which requires intensive monitoring for toxicity. This is in part due to a narrow therapeutic window, as well as potential for toxicity. This patient is being treated with Lamotrigine (Lamictal) to target their psychiatric disorder, Major depressive disorder, refractory. In regards to this medication being considered high risk, Lamotrigine (lamictal) has black box warning for serious rash including fatal reaction of Stevens-Johnson syndrome and rare cases of toxic epidermal necrolysis. In addition to prolonged titration to mitigate risk of serious rash, lamotrigine requires monitoring of hepatic and renal function at baseline, and at times will require monitoring of lamotrigine blood levels.    Problem: Mild Major Neurocognitive Disorder,   Status of problem:  new problem to this provider  Interventions:  Patient seen by Neurology ([facility]) 05/22/18 who dx pt with mild neurocognitive impairment w/ deficits in inattention and visual-spatial function with suspicion that sleep apnea and polypharmacy may be contributing to her difficulties. Recommended that psychopharm regimen be simplified, including eliminating  trazodone and considering changing Wellbutrin to Zoloft or Lexapro for anxiety and eliminating Klonopin when stable.   - However, it is very likely that patient's lupus is significantly contributing to cognitive decline, which will limit improvement she will see with reduction in polypharmacy  -- Last MOCA 22 on 12/12/2017  -- Repeat MOCA     Problem: Polypharmacy   Status of problem:  chronic and stable  Interventions:  -- may be contributing to neurocognitive disorder   -- will consider trazodone and klonopin taper     Lamotrigine Monitoring  - Hepatic function testing, platelets (via CBC) q6 months  - AST/ALT WNL     Effexor   --Impaired renal function can decrease clearance of effexor  -- most recent CrCl 89   -- will continue to monitor along with PCP given dx lupus    Psychotherapy provided:  No billable psychotherapy service provided but brief supportive therapy was utilized.    Patient has been given this writer's contact information as well as the [facility] urgent line number. The patient has been instructed to call 911 for emergencies.    Subjective:    Interval History:     Video visit. Doing okay anxiety wise. Still feels down. Fell while walking, broke pinky and face got scratched up. F/u in place to deal w/ pinky. I have a talkative brain that gives me negative thoughts. Fall was stressful, worries about walking now. Doesn't want to taper klonopin, okay w/ tapering trazodone. Will decrease from 150 to 100. Will talk about possibly increasing venlafaxine at next appointment.     Objective:    Mental Status Exam:    Appearance:    Appears stated age, Well nourished and Clean/Neat   Motor:   tremulous, appropriate eye contact    Speech/Language:    Normal rate, volume, tone, fluency and Language intact, well formed   Mood:   Anxious   Affect:   Anxious, Cooperative and Full   Thought process and Associations:   Logical, linear, clear, coherent, goal directed   Abnormal/psychotic thought content:     Denies SI, HI, self harm, delusions, obsessions, paranoid ideation, or ideas of reference   Perceptual disturbances:     Denies auditory and visual hallucinations, behavior not concerning for response to internal stimuli     Other:   insight/judgement intact      LAMICTAL MONITORING: Hepatic function testing, platelets (via CBC) at no defined frequency, but should do at baseline and can do periodically  Lamictal Level: No results found for: LAMOTRIGINE     LFTs:   Lab Results   Component Value Date    AST 11 04/29/2021    AST 28 12/29/2020    AST 24 04/02/2020    AST 24 04/08/2019    AST 32 11/05/2014    AST 30 05/08/2014      Lab Results   Component Value Date    ALT 14 04/29/2021    ALT 16 12/29/2020    ALT 23 04/02/2020    ALT 13 04/08/2019    ALT 21 11/05/2014    ALT 31 05/08/2014     Platelets:   Lab Results   Component Value Date    Platelet 347 04/29/2021    Platelet 308 12/29/2020    Platelet 330 09/15/2020    Platelet 349 04/08/2019    Platelet 304 11/05/2014    Platelet 347 05/08/2014     Lab Results   Component Value Date    WBC 4.4  04/29/2021    HGB 13.2 04/29/2021    HCT 40.7 04/29/2021    PLT 347 04/29/2021     Patient was seen and plan of care was discussed with the Attending MD, Evette Cristal, and Consults Fellow Dr Dayton Scrape who agrees with the above statement and plan.     Portions of this record have been created using Scientist, clinical (histocompatibility and immunogenetics). Dictation errors have been sought, but may not have been identified and corrected.     Marygrace Drought @ [phone]  PGY-2 Psychiatry          The patient reports they are currently: at home. I spent 15 minutes on the real-time audio and video with the patient on the date of service. I spent an additional 5 minutes on pre- and post-visit activities on the date of service.     The patient was physically located in [place] or a state in which I am permitted to provide care. The patient and/or parent/guardian understood that s/he may incur co-pays and cost sharing, and agreed to the telemedicine visit. The visit was reasonable and appropriate under the circumstances given the patient's presentation at the time.    The patient and/or parent/guardian has been advised of the potential risks and limitations of this mode of treatment (including, but not limited to, the absence of in-person examination) and has agreed to be treated using telemedicine. The patient's/patient's family's questions regarding telemedicine have been answered.     If the visit was completed in an ambulatory setting, the patient and/or parent/guardian has also been advised to contact their provider???s office for worsening conditions, and seek emergency medical treatment and/or call 911 if the patient deems either necessary.

## 2021-07-21 ENCOUNTER — Other Ambulatory Visit: Payer: Self-pay | Admitting: Family Medicine

## 2021-07-21 DIAGNOSIS — M79644 Pain in right finger(s): Secondary | ICD-10-CM

## 2021-07-21 DIAGNOSIS — T148XXA Other injury of unspecified body region, initial encounter: Secondary | ICD-10-CM

## 2021-07-21 DIAGNOSIS — M25461 Effusion, right knee: Secondary | ICD-10-CM

## 2021-07-27 ENCOUNTER — Other Ambulatory Visit: Payer: Self-pay | Admitting: Family Medicine

## 2021-07-27 ENCOUNTER — Institutional Professional Consult (permissible substitution): Admit: 2021-07-27 | Discharge: 2021-07-28 | Payer: MEDICARE | Attending: Psychologist | Primary: Psychologist

## 2021-07-27 DIAGNOSIS — F410 Panic disorder [episodic paroxysmal anxiety] without agoraphobia: Principal | ICD-10-CM

## 2021-07-27 DIAGNOSIS — F4310 Post-traumatic stress disorder, unspecified: Principal | ICD-10-CM

## 2021-07-27 DIAGNOSIS — F331 Major depressive disorder, recurrent, moderate: Principal | ICD-10-CM

## 2021-07-27 DIAGNOSIS — F411 Generalized anxiety disorder: Principal | ICD-10-CM

## 2021-07-27 NOTE — Telephone Encounter (Signed)
Requested Prescriptions  Pending Prescriptions Disp Refills  . amLODipine (NORVASC) 2.5 MG tablet [Pharmacy Med Name: amLODIPine Besylate 2.5 MG Oral Tablet] 270 tablet 1    Sig: TAKE 3 TABLETS BY MOUTH  DAILY     Cardiovascular:  Calcium Channel Blockers Passed - 07/27/2021  5:17 AM      Passed - Last BP in normal range    BP Readings from Last 1 Encounters:  07/14/21 132/78         Passed - Valid encounter within last 6 months    Recent Outpatient Visits          1 week ago Need for Tdap vaccination   [facility] Steele Sizer, MD   1 month ago Postinflammatory pulmonary fibrosis [facility])   [facility] Steele Sizer, MD   5 months ago Fluctuating blood pressure   [facility], Drue Stager, MD   5 months ago    [facility] Kathrine Haddock, NP   7 months ago Hyperglycemia   [facility] Steele Sizer, MD      Future Appointments            In 3 weeks  [facility], [place]   In 4 months Steele Sizer, MD [facility]           . telmisartan ([place]) 80 MG tablet [Pharmacy Med Name: Telmisartan 80 MG Oral Tablet] 90 tablet 1    Sig: TAKE 1 TABLET BY MOUTH  DAILY     Cardiovascular:  Angiotensin Receptor Blockers Passed - 07/27/2021  5:17 AM      Passed - Cr in normal range and within 180 days    Creat  Date Value Ref Range Status  06/15/2021 1.00 0.50 - 1.05 mg/dL Final   Creatinine, Urine  Date Value Ref Range Status  11/07/2019 62 20 - 275 mg/dL Final         Passed - K in normal range and within 180 days    Potassium  Date Value Ref Range Status  06/15/2021 4.7 3.5 - 5.3 mmol/L Final  12/10/2013 3.7 3.5 - 5.1 mmol/L Final         Passed - Patient is not pregnant      Passed - Last BP in normal range    BP Readings from Last 1 Encounters:  07/14/21 132/78         Passed - Valid encounter within last 6 months     Recent Outpatient Visits          1 week ago Need for Tdap vaccination   [facility] Steele Sizer, MD   1 month ago Postinflammatory pulmonary fibrosis [facility])   [facility] Steele Sizer, MD   5 months ago Fluctuating blood pressure   [facility] Steele Sizer, MD   5 months ago    [place], NP   7 months ago Hyperglycemia   [facility] Steele Sizer, MD      Future Appointments            In 3 weeks  [facility], [place]   In 4 months Steele Sizer, MD [facility], [place]           \
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
## 2021-07-28 DIAGNOSIS — K5900 Constipation, unspecified: Principal | ICD-10-CM

## 2021-07-28 NOTE — Unmapped (Signed)
The [facility] has made a second and final attempt to reach this patient to refill the following medication:mycophenolate.      We have been unable to leave messages on the following phone numbers: [phone] and have sent a MyChart message.    Dates contacted: 11/10, 11/23  Last scheduled delivery: 8/29    The patient may be at risk of non-compliance with this medication. The patient should call the [facility] at [phone]  Option 4, then Option 2 (all other specialty patients) to refill medication.    Olga Millers   [facility] Specialty Technician

## 2021-08-02 ENCOUNTER — Ambulatory Visit: Admit: 2021-08-02 | Discharge: 2021-08-03 | Payer: MEDICARE

## 2021-08-02 DIAGNOSIS — J849 Interstitial pulmonary disease, unspecified: Secondary | ICD-10-CM | POA: Diagnosis not present

## 2021-08-02 DIAGNOSIS — J8489 Other specified interstitial pulmonary diseases: Secondary | ICD-10-CM | POA: Diagnosis not present

## 2021-08-02 DIAGNOSIS — R918 Other nonspecific abnormal finding of lung field: Secondary | ICD-10-CM | POA: Diagnosis not present

## 2021-08-03 NOTE — Unmapped (Signed)
Patient called and left voicemail asking about results of chest ct scan.    Called patient back.  Patient states she read the report on MyChart but would like Dr. Lupita Leash to explain some terms to her.  States she would like a call back.  Informed patient that message would get forwarded to provider.  Patient verbalized understanding.

## 2021-08-04 ENCOUNTER — Ambulatory Visit: Admit: 2021-08-04 | Discharge: 2021-08-05 | Payer: MEDICARE

## 2021-08-04 DIAGNOSIS — S6991XA Unspecified injury of right wrist, hand and finger(s), initial encounter: Secondary | ICD-10-CM | POA: Diagnosis not present

## 2021-08-04 DIAGNOSIS — S62616A Displaced fracture of proximal phalanx of right little finger, initial encounter for closed fracture: Secondary | ICD-10-CM | POA: Diagnosis not present
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
## 2021-08-04 NOTE — Unmapped (Signed)
Thank you for choosing [facility]!  We appreciate the opportunity to participate in your care.      If any questions or concerns arise after your visit, please do not hesitant to contact me by  MyChart or by calling the hand team at [phone].      Voicemail messages: Messages are checked between 8:00 am- 4:00 pm Monday- Friday.    MyChart messages: These messages are checked by the nurses during normal business hours 8:30 am-4:30 pm Monday-Friday every 24-48 hours and are for non-urgent, non-emergent concerns. You may be asked to return for a follow up visit if it is deemed your questions are best handled in the clinic setting.    Please let me know if I can be of assistance with this or other orthopaedic issues in the future.

## 2021-08-05 ENCOUNTER — Ambulatory Visit
Admit: 2021-08-05 | Payer: MEDICARE | Attending: Rehabilitative and Restorative Service Providers" | Primary: Rehabilitative and Restorative Service Providers"

## 2021-08-05 ENCOUNTER — Ambulatory Visit: Admit: 2021-08-05 | Payer: MEDICARE

## 2021-08-05 ENCOUNTER — Ambulatory Visit: Admit: 2021-08-05 | Discharge: 2021-09-03 | Payer: MEDICARE

## 2021-08-05 ENCOUNTER — Ambulatory Visit
Admit: 2021-08-05 | Discharge: 2021-08-06 | Payer: MEDICARE | Attending: Orthopaedic Surgery | Primary: Orthopaedic Surgery

## 2021-08-05 DIAGNOSIS — M25641 Stiffness of right hand, not elsewhere classified: Secondary | ICD-10-CM | POA: Diagnosis not present

## 2021-08-05 DIAGNOSIS — S62616A Displaced fracture of proximal phalanx of right little finger, initial encounter for closed fracture: Secondary | ICD-10-CM | POA: Diagnosis not present
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
## 2021-08-06 DIAGNOSIS — R0600 Dyspnea, unspecified: Principal | ICD-10-CM

## 2021-08-06 DIAGNOSIS — J8489 Other specified interstitial pulmonary diseases: Principal | ICD-10-CM

## 2021-08-06 NOTE — Unmapped (Signed)
I interviewed the patient for 10 minutes via video link.  I discussed the case with the resident and agree with the assessment and plan as outlined in the note.

## 2021-08-06 NOTE — Unmapped (Signed)
Called patient Wednesday and Today to discuss results of CT scan and plans for next step to better evaluate dyspnea.     Will send her a MyChart message outlining the plan.    CT scan not significantly worse in terms of fibrosis though DLCO was trending down. Will get CBC to check for developing anemia. Will encourage her to get previously ordered sniff test. Will get TTE to look for pulmonary hypertension, if she does have pulmonary hypertension will pursue V/Q scan to look for clot. At her convenience will get to assess for hypoxemia.    Layla Maw MD  Pulmonary and Critical Care Fellow

## 2021-08-06 NOTE — Unmapped (Signed)
I saw and evaluated the patient, participating in the key portions of the E/M service.  I discussed the findings, assessment and plan with the resident and agree with resident’s findings and plan as documented in the resident's note.  I was immediately available for the entirety of the procedure(s) and present for the key and critical portions. Holly Pring, MD

## 2021-08-10 NOTE — Unmapped (Signed)
[facility] Refill Coordination Note    Specialty Medication(s) to be Shipped:   Inflammatory Disorders: mycophenolate (MYFORTIC) 360 MG TbEC    Other medication(s) to be shipped: No additional medications requested for fill at this time     Caitlin Smith, DOB: 03-21-1956  Phone: [phone] (home)       All above HIPAA information was verified with patient.     Was a Nurse, learning disability used for this call? No    Completed refill call assessment today to schedule patient's medication shipment from the [facility] [phone]).  All relevant notes have been reviewed.     Specialty medication(s) and dose(s) confirmed: Regimen is correct and unchanged.   Changes to medications: Caitlin Smith reports no changes at this time.  Changes to insurance: No  New side effects reported not previously addressed with a pharmacist or physician: None reported  Questions for the pharmacist: No    Confirmed patient received a Conservation officer, historic buildings and a Surveyor, mining with first shipment. The patient will receive a drug information handout for each medication shipped and additional FDA Medication Guides as required.       DISEASE/MEDICATION-SPECIFIC INFORMATION        N/A    SPECIALTY MEDICATION ADHERENCE     Medication Adherence    Patient reported X missed doses in the last month: 0  Specialty Medication: mycophenolate (MYFORTIC) 360 MG TbEC  Patient is on additional specialty medications: No              Were doses missed due to medication being on hold? No    mycophenolate  5 days worth of medication on hand.        REFERRAL TO PHARMACIST     Referral to the pharmacist: Not needed      [facility]     Shipping address confirmed in Epic.     Delivery Scheduled: Yes, Expected medication delivery date: 08/12/21.     Medication will be delivered via Same Day Courier to the prescription address in Epic WAM.    [place] A Kacee Koren   [facility] Specialty Technician

## 2021-08-11 ENCOUNTER — Ambulatory Visit: Admit: 2021-08-11 | Discharge: 2021-08-12 | Payer: MEDICARE

## 2021-08-11 DIAGNOSIS — J8489 Other specified interstitial pulmonary diseases: Secondary | ICD-10-CM | POA: Diagnosis not present

## 2021-08-11 LAB — CBC W/ AUTO DIFF
BASOPHILS ABSOLUTE COUNT: 0.1 10*9/L (ref 0.0–0.1)
BASOPHILS RELATIVE PERCENT: 2.1 %
EOSINOPHILS ABSOLUTE COUNT: 0.2 10*9/L (ref 0.0–0.5)
EOSINOPHILS RELATIVE PERCENT: 4.4 %
HEMATOCRIT: 39.3 % (ref 34.0–44.0)
HEMOGLOBIN: 13.2 g/dL (ref 11.3–14.9)
LYMPHOCYTES ABSOLUTE COUNT: 1.8 10*9/L (ref 1.1–3.6)
LYMPHOCYTES RELATIVE PERCENT: 37.9 %
MEAN CORPUSCULAR HEMOGLOBIN CONC: 33.7 g/dL (ref 32.0–36.0)
MEAN CORPUSCULAR HEMOGLOBIN: 28.3 pg (ref 25.9–32.4)
MEAN CORPUSCULAR VOLUME: 83.9 fL (ref 77.6–95.7)
MEAN PLATELET VOLUME: 7.4 fL (ref 6.8–10.7)
MONOCYTES ABSOLUTE COUNT: 0.5 10*9/L (ref 0.3–0.8)
MONOCYTES RELATIVE PERCENT: 10.6 %
NEUTROPHILS ABSOLUTE COUNT: 2.2 10*9/L (ref 1.8–7.8)
NEUTROPHILS RELATIVE PERCENT: 45 %
NUCLEATED RED BLOOD CELLS: 0 /100{WBCs} (ref <=4)
PLATELET COUNT: 306 10*9/L (ref 150–450)
RED BLOOD CELL COUNT: 4.68 10*12/L (ref 3.95–5.13)
RED CELL DISTRIBUTION WIDTH: 14.2 % (ref 12.2–15.2)
WBC ADJUSTED: 4.8 10*9/L (ref 3.6–11.2)

## 2021-08-12 MED FILL — MYCOPHENOLATE SODIUM 360 MG TABLET,DELAYED RELEASE: ORAL | 90 days supply | Qty: 180 | Fill #1

## 2021-08-13 ENCOUNTER — Ambulatory Visit: Admit: 2021-08-13 | Discharge: 2021-08-14 | Payer: MEDICARE

## 2021-08-13 DIAGNOSIS — L280 Lichen simplex chronicus: Secondary | ICD-10-CM | POA: Diagnosis not present

## 2021-08-13 DIAGNOSIS — N901 Moderate vulvar dysplasia: Principal | ICD-10-CM

## 2021-08-13 MED ORDER — CLOBETASOL 0.05 % TOPICAL OINTMENT
TOPICAL | 3 refills | 0.00000 days | Status: CP
Start: 2021-08-13 — End: 2022-08-13

## 2021-08-13 NOTE — Unmapped (Unsigned)
This encounter was created in error - please disregard. reviewed in the rooming section of Epic.     ROS: Other than symptoms mentioned in the HPI, no fevers, chills, or other skin complaints    Physical Examination     GENERAL: Well-appearing female in no acute distress, resting comfortably.  NEURO: Alert and oriented, answers questions appropriately  PSYCH: Normal mood and affect  RESP: No increased work of breathing  {PE extent:75514}  {PE list:75421}  ***    {PE limitations:75419::All areas not commented on are within normal limits or unremarkable}      (Approved Template 05/18/2020)
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
## 2021-08-13 NOTE — Unmapped (Signed)
Vulvar/Vaginal Moisturizers    Moisturizer Options:  Vitamin E oil: pump or capsule form  Vitamin E cream (Gene???s vitamin E cream)  Coconut oil: bottle or bead form  Shea butter  Blossom Organic Lubricant (organic and all natural; www.blossomorganics.com)  PE suppository(coconut oil/vitamin E/palm oil)  Desert Harvest Aloe Glide??  Ah Yes  Jojoba oil       Consider the ingredients of the product - the fewer the ingredients the better!    Directions for Use:  Clean and dry your hands  Gently dab the vulvar/vaginal area dry as needed  Apply a ???pea-sized??? amount of the moisturizer onto your fingertip  Using you other hand, open the labia    Apply the moisturizer to the vulvar/vaginal tissues  Wear loose fitting underwear/clothing if possible following application    Use moisturize 2-3 times daily as desired.     Healthy vulval hygiene practices  Avoid Substitute   Clothing   Pantyhose Stockings with a garter belt  Thigh-high or knee-high stockings    Synthetic underwear Cotton underwear or no underwear   Jeans and other tight pants Loose pants, skirts, dresses   Swimsuits, leotards, thongs, lycra garments Loose-fitting cotton garments   Cleansing products   Scented soaps or shampoos Fragrance-free pH neutral soap   Bubble bath Tub baths in the morning and at night without additives and at a comfortable temperature   Scented detergents Unscented detergents   Baby wipes or flushable wipes Rinse with water using sports water bottle or perineal irrigation bottle   Feminine sprays, douches, powders These are not necessary products and can be omitted from personal practices   Other   Washcloths Use fingertips for washing; pat dry, do not rub dry   Panty liners Tampons or cotton pads   Dyed toilet articles Toilet articles without dyes   Hair dryers to dry vulva skin without contact Dry vulva by gentle patting

## 2021-08-13 NOTE — Unmapped (Signed)
Pt has appt today at 1pm with Dr. Tamela Oddi. Pt has cough and hoarseness. Denies fever, chills, or body aches. Instructed patient to take at home COVID test and notify us if positive. If negative, pt may come to appt with mask. Pt V/U.

## 2021-08-17 ENCOUNTER — Ambulatory Visit: Payer: Medicare Other

## 2021-08-17 DIAGNOSIS — M25641 Stiffness of right hand, not elsewhere classified: Secondary | ICD-10-CM | POA: Diagnosis not present

## 2021-08-17 DIAGNOSIS — S62616A Displaced fracture of proximal phalanx of right little finger, initial encounter for closed fracture: Secondary | ICD-10-CM | POA: Diagnosis not present

## 2021-08-17 NOTE — Unmapped (Signed)
Patient would like to reschedule her appointment with Dr Eustace Quail at the [facility] location.    Thank you.
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
## 2021-08-17 NOTE — Unmapped (Signed)
done

## 2021-08-19 ENCOUNTER — Ambulatory Visit (INDEPENDENT_AMBULATORY_CARE_PROVIDER_SITE_OTHER): Payer: Medicare Other

## 2021-08-19 ENCOUNTER — Ambulatory Visit: Payer: Medicare Other

## 2021-08-19 ENCOUNTER — Ambulatory Visit: Admit: 2021-08-19 | Payer: MEDICARE

## 2021-08-19 VITALS — BP 134/82 | HR 68 | Temp 98.1°F | Resp 16 | Ht 67.0 in | Wt 240.5 lb

## 2021-08-19 DIAGNOSIS — Z0000 Encounter for general adult medical examination without abnormal findings: Secondary | ICD-10-CM | POA: Diagnosis not present

## 2021-08-19 NOTE — Patient Instructions (Signed)
[name] , Thank you for taking time to come for your Medicare Wellness Visit. I appreciate your ongoing commitment to your health goals. Please review the following plan we discussed and let me know if I can assist you in the future.   Screening recommendations/referrals: Colonoscopy: done 08/23/13; scheduled for 10/2021 at [facility]: done 05/04/2020; scheduled for 08/2021 Bone Density: done  Recommended yearly ophthalmology/optometry visit for glaucoma screening and checkup Recommended yearly dental visit for hygiene and checkup  Vaccinations: Influenza vaccine: done 06/15/21 Pneumococcal vaccine: done 08/04/16 Tdap vaccine: done 07/14/21 Shingles vaccine: Shingrix discussed. Please contact your pharmacy for coverage information.  Covid-19: done 12/31/20 & 04/29/21  Advanced directives: Advance directive discussed with you today. I have provided a copy for you to complete at home and have notarized. Once this is complete please bring a copy in to our office so we can scan it into your chart.   Conditions/risks identified: Recommend drinking 6-8 glasses of water per day   Next appointment: Follow up in one year for your annual wellness visit.   Preventive Care 40-64 Years, Female Preventive care refers to lifestyle choices and visits with your health care provider that can promote health and wellness. What does preventive care include? A yearly physical exam. This is also called an annual well check. Dental exams once or twice a year. Routine eye exams. Ask your health care provider how often you should have your eyes checked. Personal lifestyle choices, including: Daily care of your teeth and gums. Regular physical activity. Eating a healthy diet. Avoiding tobacco and drug use. Limiting alcohol use. Practicing safe sex. Taking low-dose aspirin daily starting at age 66. Taking vitamin and mineral supplements as recommended by your health care provider. What happens during  an annual well check? The services and screenings done by your health care provider during your annual well check will depend on your age, overall health, lifestyle risk factors, and family history of disease. Counseling  Your health care provider may ask you questions about your: Alcohol use. Tobacco use. Drug use. Emotional well-being. Home and relationship well-being. Sexual activity. Eating habits. Work and work Statistician. Method of birth control. Menstrual cycle. Pregnancy history. Screening  You may have the following tests or measurements: Height, weight, and BMI. Blood pressure. Lipid and cholesterol levels. These may be checked every 5 years, or more frequently if you are over 65 years old. Skin check. Lung cancer screening. You may have this screening every year starting at age 68 if you have a 30-pack-year history of smoking and currently smoke or have quit within the past 15 years. Fecal occult blood test (FOBT) of the stool. You may have this test every year starting at age 32. Flexible sigmoidoscopy or colonoscopy. You may have a sigmoidoscopy every 5 years or a colonoscopy every 10 years starting at age 56. Hepatitis C blood test. Hepatitis B blood test. Sexually transmitted disease (STD) testing. Diabetes screening. This is done by checking your blood sugar (glucose) after you have not eaten for a while (fasting). You may have this done every 1-3 years. Mammogram. This may be done every 1-2 years. Talk to your health care provider about when you should start having regular mammograms. This may depend on whether you have a family history of breast cancer. BRCA-related cancer screening. This may be done if you have a family history of breast, ovarian, tubal, or peritoneal cancers. Pelvic exam and Pap test. This may be done every 3 years starting at age 47.  Starting at age 72, this may be done every 5 years if you have a Pap test in combination with an HPV test. Bone  density scan. This is done to screen for osteoporosis. You may have this scan if you are at high risk for osteoporosis. Discuss your test results, treatment options, and if necessary, the need for more tests with your health care provider. Vaccines  Your health care provider may recommend certain vaccines, such as: Influenza vaccine. This is recommended every year. Tetanus, diphtheria, and acellular pertussis (Tdap, Td) vaccine. You may need a Td booster every 10 years. Zoster vaccine. You may need this after age 28. Pneumococcal 13-valent conjugate (PCV13) vaccine. You may need this if you have certain conditions and were not previously vaccinated. Pneumococcal polysaccharide (PPSV23) vaccine. You may need one or two doses if you smoke cigarettes or if you have certain conditions. Talk to your health care provider about which screenings and vaccines you need and how often you need them. This information is not intended to replace advice given to you by your health care provider. Make sure you discuss any questions you have with your health care provider. Document Released: 09/18/2015 Document Revised: 05/11/2016 Document Reviewed: 06/23/2015 Elsevier Interactive Patient Education  2017 [employer] Prevention in the Home Falls can cause injuries. They can happen to people of all ages. There are many things you can do to make your home safe and to help prevent falls. What can I do on the outside of my home? Regularly fix the edges of walkways and driveways and fix any cracks. Remove anything that might make you trip as you walk through a door, such as a raised step or threshold. Trim any bushes or trees on the path to your home. Use bright outdoor lighting. Clear any walking paths of anything that might make someone trip, such as rocks or tools. Regularly check to see if handrails are loose or broken. Make sure that both sides of any steps have handrails. Any raised decks and  porches should have guardrails on the edges. Have any leaves, snow, or ice cleared regularly. Use sand or salt on walking paths during winter. Clean up any spills in your garage right away. This includes oil or grease spills. What can I do in the bathroom? Use night lights. Install grab bars by the toilet and in the tub and shower. Do not use towel bars as grab bars. Use non-skid mats or decals in the tub or shower. If you need to sit down in the shower, use a plastic, non-slip stool. Keep the floor dry. Clean up any water that spills on the floor as soon as it happens. Remove soap buildup in the tub or shower regularly. Attach bath mats securely with double-sided non-slip rug tape. Do not have throw rugs and other things on the floor that can make you trip. What can I do in the bedroom? Use night lights. Make sure that you have a light by your bed that is easy to reach. Do not use any sheets or blankets that are too big for your bed. They should not hang down onto the floor. Have a firm chair that has side arms. You can use this for support while you get dressed. Do not have throw rugs and other things on the floor that can make you trip. What can I do in the kitchen? Clean up any spills right away. Avoid walking on wet floors. Keep items that you use  a lot in easy-to-reach places. If you need to reach something above you, use a strong step stool that has a grab bar. Keep electrical cords out of the way. Do not use floor polish or wax that makes floors slippery. If you must use wax, use non-skid floor wax. Do not have throw rugs and other things on the floor that can make you trip. What can I do with my stairs? Do not leave any items on the stairs. Make sure that there are handrails on both sides of the stairs and use them. Fix handrails that are broken or loose. Make sure that handrails are as long as the stairways. Check any carpeting to make sure that it is firmly attached to the  stairs. Fix any carpet that is loose or worn. Avoid having throw rugs at the top or bottom of the stairs. If you do have throw rugs, attach them to the floor with carpet tape. Make sure that you have a light switch at the top of the stairs and the bottom of the stairs. If you do not have them, ask someone to add them for you. What else can I do to help prevent falls? Wear shoes that: Do not have high heels. Have rubber bottoms. Are comfortable and fit you well. Are closed at the toe. Do not wear sandals. If you use a stepladder: Make sure that it is fully opened. Do not climb a closed stepladder. Make sure that both sides of the stepladder are locked into place. Ask someone to hold it for you, if possible. Clearly mark and make sure that you can see: Any grab bars or handrails. First and last steps. Where the edge of each step is. Use tools that help you move around (mobility aids) if they are needed. These include: Canes. Walkers. Scooters. Crutches. Turn on the lights when you go into a dark area. Replace any light bulbs as soon as they burn out. Set up your furniture so you have a clear path. Avoid moving your furniture around. If any of your floors are uneven, fix them. If there are any pets around you, be aware of where they are. Review your medicines with your doctor. Some medicines can make you feel dizzy. This can increase your chance of falling. Ask your doctor what other things that you can do to help prevent falls. This information is not intended to replace advice given to you by your health care provider. Make sure you discuss any questions you have with your health care provider. Document Released: 06/18/2009 Document Revised: 01/28/2016 Document Reviewed: 09/26/2014 Elsevier Interactive Patient Education  2017 [employer].

## 2021-08-19 NOTE — Progress Notes (Signed)
Subjective:   Nicole Ballard is a 65 y.o. female who presents for Medicare Annual (Subsequent) preventive examination.  Review of Systems     Cardiac Risk Factors include: dyslipidemia;hypertension;obesity (BMI >30kg/m2)     Objective:    Today's Vitals   08/19/21 1331 08/19/21 1334  BP: 134/82   Pulse: 68   Resp: 16   Temp: 98.1 F (36.7 C)   TempSrc: Oral   SpO2: 96%   Weight: 240 lb 8 oz (109.1 kg)   Height: _0  (1.702 m)   PainSc:  4    Body mass index is 37.67 kg/m.  Advanced Directives 08/19/2021 05/25/2021 01/29/2021 01/19/2021 08/13/2020 08/09/2019 01/21/2019  Does Patient Have a Medical Advance Directive? _1  No No  Would patient like information on creating a medical advance directive? Yes (MAU/Ambulatory/Procedural Areas - Information given) - No - Patient declined - Yes (MAU/Ambulatory/Procedural Areas - Information given) Yes (MAU/Ambulatory/Procedural Areas - Information given) No - Patient declined    Current Medications (verified) Outpatient Encounter Medications as of 08/19/2021  Medication Sig   albuterol (VENTOLIN HFA) 108 (90 Base) MCG/ACT inhaler SMARTSIG:2 Puff(s) By Mouth Every 6 Hours PRN   amLODipine (NORVASC) 2.5 MG tablet TAKE 3 TABLETS BY MOUTH  DAILY   aspirin EC 81 MG tablet Take 1 tablet (81 mg total) by mouth daily.   atenolol (TENORMIN) 25 MG tablet Take 1 tablet (25 mg total) by mouth daily.   atorvastatin (LIPITOR) 40 MG tablet TAKE 1 TABLET BY MOUTH  DAILY   Biotin 1 MG CAPS Take 1 capsule by mouth daily.   buPROPion (WELLBUTRIN XL) 150 MG 24 hr tablet Take 150 mg by mouth every morning.   Cholecalciferol (VITAMIN D) 2000 UNITS CAPS Take 1 capsule by mouth daily.   clobetasol ointment (TEMOVATE) 2.92 % Apply 1 application topically 2 (two) times daily.   clonazePAM (KLONOPIN) 0.5 MG tablet Take by mouth.   clotrimazole-betamethasone (LOTRISONE) cream Apply 1 application topically 2 (two) times daily.   cyclobenzaprine  (FLEXERIL) 5 MG tablet Take 1-2 tablets (5-10 mg total) by mouth 3 (three) times daily as needed for muscle spasms.   diclofenac (VOLTAREN) 75 MG EC tablet Take 1 tablet (75 mg total) by mouth 2 (two) times daily.   diclofenac Sodium (VOLTAREN) 1 % GEL Apply topically daily as needed.   famotidine (PEPCID) 20 MG tablet Take by mouth.   fluticasone (FLONASE) 50 MCG/ACT nasal spray Place 2 sprays into both nostrils daily.   fluticasone (FLOVENT HFA) 220 MCG/ACT inhaler Inhale into the lungs.   gabapentin (NEURONTIN) 300 MG capsule Take 300 mg (1 capsule) in the morning and at 300 mg (1 capsule) at noon and 600 mg (2 capsules) at night.   hydrALAZINE (APRESOLINE) 10 MG tablet TAKE 1 TABLET BY MOUTH THREE TIMES DAILY AS NEEDED FOR BLOOD PRESSURE ABOVE 150/90   hydroxychloroquine (PLAQUENIL) 200 MG tablet Take by mouth 2 (two) times daily.   hydrOXYzine (ATARAX/VISTARIL) 10 MG tablet Take 1 tablet (10 mg total) by mouth 3 (three) times daily as needed.   lamoTRIgine (LAMICTAL) 200 MG tablet Take 200 mg by mouth 2 (two) times daily.   LINZESS 145 MCG CAPS capsule Take 145 mcg by mouth daily.   loratadine (CLARITIN) 10 MG tablet Take 1 tablet (10 mg total) by mouth daily.   Melatonin 10 MG TABS Take 1 tablet by mouth at bedtime.   mycophenolate (MYFORTIC) 360 MG TBEC EC tablet Take 360 mg by mouth 2 (  two) times daily.   olopatadine (PATANOL) 0.1 % ophthalmic solution Place 1 drop into both eyes 2 (two) times daily.   omeprazole (PRILOSEC) 40 MG capsule TAKE 1 CAPSULE BY MOUTH  DAILY   Polyethylene Glycol 3350 (MIRALAX PO) Take by mouth.   pregabalin (LYRICA) 50 MG capsule    telmisartan (MICARDIS) 80 MG tablet TAKE 1 TABLET BY MOUTH  DAILY   traZODone (DESYREL) 100 MG tablet Take 150 mg by mouth at bedtime.   triamcinolone ointment (KENALOG) 0.1 %    venlafaxine (EFFEXOR) 100 MG tablet Take 100 mg by mouth daily.   venlafaxine XR (EFFEXOR-XR) 37.5 MG 24 hr capsule Take 37.5 mg by mouth 2 (two) times  daily.   [DISCONTINUED] diclofenac sodium (VOLTAREN) 1 % GEL APP 2 GRAMS EXT AA QID   No facility-administered encounter medications on file as of 08/19/2021.    Allergies (verified) Ace inhibitors, Bee pollen, Penicillins, and Pollen extract   History: Past Medical History:  Diagnosis Date   Anxiety    Bipolar 1 disorder (HCC)    Chronic kidney disease    Chronic pain    Diabetes mellitus without complication (HCC)    Fibromyalgia    GERD (gastroesophageal reflux disease)    Hearing loss of right ear    Hemifacial spasm    Hyperlipidemia    Hypertension    Insomnia    Lumbago    Osteoarthritis    Paresthesia    Rhinitis    Systemic lupus erythematosus ([place])    Vitamin D deficiency    Past Surgical History:  Procedure Laterality Date   BRAIN SURGERY     CHOLECYSTECTOMY     VAGINAL HYSTERECTOMY     Family History  Problem Relation Age of Onset   Breast cancer Mother    Cancer Mother 36       Breast   Cancer Father 38       Stomach   Breast cancer Maternal Aunt    Social History   Socioeconomic History   Marital status: Divorced    Spouse name: Not on file   Number of children: 2   Years of education: Not on file   Highest education level: GED or equivalent  Occupational History   Occupation: Disability  Tobacco Use   Smoking status: Never   Smokeless tobacco: Never  Vaping Use   Vaping Use: Never used  Substance and Sexual Activity   Alcohol use: No    Alcohol/week: 0.0 standard drinks   Drug use: No   Sexual activity: Not Currently    Partners: Male  Other Topics Concern   Not on file  Social History Narrative   Pt lives alone   Social Determinants of Health   Financial Resource Strain: Low Risk    Difficulty of Paying Living Expenses: Not hard at all  Food Insecurity: No Food Insecurity   Worried About Charity fundraiser in the Last Year: Never true   Ran Out of Food in the Last Year: Never true  Transportation Needs: No Transportation  Needs   Lack of Transportation (Medical): No   Lack of Transportation (Non-Medical): No  Physical Activity: Sufficiently Active   Days of Exercise per Week: 5 days   Minutes of Exercise per Session: 30 min  Stress: Stress Concern Present   Feeling of Stress : To some extent  Social Connections: Moderately Integrated   Frequency of Communication with Friends and Family: More than three times a week   Frequency of  Social Gatherings with Friends and Family: Once a week   Attends Religious Services: More than 4 times per year   Active Member of [employer] or Organizations: Yes   Attends Music therapist: More than 4 times per year   Marital Status: Divorced    Tobacco Counseling Counseling given: Not Answered   Clinical Intake:  Pre-visit preparation completed: Yes  Pain : 0-10 Pain Score: 4  Pain Type: Chronic pain Pain Location: Back Pain Orientation: Lower Pain Descriptors / Indicators: Aching, Discomfort Pain Onset: More than a month ago Pain Frequency: Constant     BMI - recorded: 37.67 Nutritional Status: BMI > 30  Obese Nutritional Risks: None Diabetes: No  How often do you need to have someone help you when you read instructions, pamphlets, or other written materials from your doctor or pharmacy?: 1 - Never    Interpreter Needed?: No  Information entered by :: Clemetine Marker LPN   Activities of Daily Living In your present state of health, do you have any difficulty performing the following activities: 08/19/2021 07/14/2021  Hearing? Tempie Donning  Vision? N N  Difficulty concentrating or making decisions? N N  Walking or climbing stairs? Y Y  Dressing or bathing? N N  Doing errands, shopping? N N  Preparing Food and eating ? N -  Using the Toilet? N -  In the past six months, have you accidently leaked urine? Y -  Do you have problems with loss of bowel control? N -  Managing your Medications? N -  Managing your Finances? N -  Housekeeping or managing  your Housekeeping? N -  Some recent data might be hidden    Patient Care Team: Steele Sizer, MD as PCP - General (Family Medicine) Blima Dessert, MD as Referring Physician (Rheumatology) Isaias Cowman, MD as Consulting Physician (Cardiology) Lucius Conn Annice Needy, MD as Referring Physician (Otolaryngology) Goetzinger, Lurlean Leyden, PhD as Referring Physician (Psychology) Ardyth Harps, MD as Referring Physician (Psychiatry) Dinah Beers, PA (Gastroenterology) Sammie Bench, MD (Pulmonary Disease)  Indicate any recent Medical Services you may have received from other than [facility] providers in the past year (date may be approximate).     Assessment:   This is a routine wellness examination for Samah.  Hearing/Vision screen Hearing Screening - Comments:: Pt c/o being deaf in her right ear Vision Screening - Comments:: Annual vision screenings at [place] issues and exercise activities discussed: Current Exercise Habits: Home exercise routine, Type of exercise: walking, Time (Minutes): 30, Frequency (Times/Week): 5, Weekly Exercise (Minutes/Week): 150, Intensity: Mild, Exercise limited by: neurologic condition(s)   Goals Addressed             This Visit's Progress    DIET - INCREASE WATER INTAKE   Not on track    Recommend drinking 6-8 glasses of water per day        Depression Screen [facility] 2/9 Scores 08/19/2021 07/14/2021 06/15/2021 02/04/2021 02/02/2021 12/14/2020 08/13/2020  PHQ - 2 Score 4 0 _0 PHQ- 9 Score 18 - _1 Fall Risk Fall Risk  08/19/2021 07/14/2021 06/15/2021 02/04/2021 02/02/2021  Falls in the past year? _2 0 0  Number falls in past yr: 1 1 0 0 0  Comment - - - - -  Injury with Fall? 1 1 0 0 0  Risk for fall due to : History of fall(s) No Fall Risks No Fall Risks - -  Risk for fall due to: Comment - - - - -  Follow up Falls prevention discussed Falls prevention discussed Falls prevention discussed - -    FALL RISK  PREVENTION PERTAINING TO THE HOME:  Any stairs in or around the home? Yes  If so, are there any without handrails? No  Home free of loose throw rugs in walkways, pet beds, electrical cords, etc? Yes  Adequate lighting in your home to reduce risk of falls? Yes   ASSISTIVE DEVICES UTILIZED TO PREVENT FALLS:  Life alert? No  Use of a cane, walker or w/c? No  Grab bars in the bathroom? No  Shower chair or bench in shower? No  Elevated toilet seat or a handicapped toilet? No   TIMED UP AND GO:  Was the test performed? Yes .  Length of time to ambulate 10 feet: 6 sec.   Gait steady and fast without use of assistive device  Cognitive Function:     6CIT Screen 08/13/2020 08/09/2019  What Year? 0 points 0 points  What month? 0 points 0 points  What time? 0 points 0 points  Count back from 20 0 points 0 points  Months in reverse 2 points 2 points  Repeat phrase 2 points 0 points  Total Score 4 2    Immunizations Immunization History  Administered Date(s) Administered   Hepatitis A, Adult 08/12/2015, 02/10/2016   Hepatitis B, adult 06/26/2007, 08/12/2015, 10/13/2015, 02/10/2016   Influenza Split 06/01/2010, 05/14/2013, 04/19/2014   Influenza, Seasonal, Injecte, Preservative Fre 06/26/2007, 06/02/2011, 12/01/2011, 06/09/2013   Influenza,inj,Quad PF,6+ Mos 04/16/2015, 05/26/2016, 05/17/2017, 05/14/2018, 06/12/2019, 06/15/2020, 06/15/2021   Influenza-Unspecified 06/05/2020   MMR 06/26/2007   Measles / Rubella 10/05/1987   Moderna Sars-Covid-2 Vaccination 12/31/2020, 04/29/2021   Pneumococcal Conjugate-13 08/06/2015, 05/26/2016   Pneumococcal Polysaccharide-23 02/22/2010, 08/04/2016   Tdap 06/26/2007, 02/22/2010, 07/14/2021    TDAP status: Up to date  Flu Vaccine status: Up to date  Pneumococcal vaccine status: Up to date  Covid-19 vaccine status: Completed vaccines  Qualifies for Shingles Vaccine? Yes   Zostavax completed No   Shingrix Completed?: No.    Education has  been provided regarding the importance of this vaccine. Patient has been advised to call insurance company to determine out of pocket expense if they have not yet received this vaccine. Advised may also receive vaccine at local pharmacy or Health Dept. Verbalized acceptance and understanding.  Screening Tests Health Maintenance  Topic Date Due   PAP SMEAR-Modifier  03/29/2021   MAMMOGRAM  05/04/2021   COVID-19 Vaccine (3 - Moderna risk series) 05/27/2021   Zoster Vaccines- Shingrix (1 of 2) 10/14/2021 (Originally 01/14/1975)   Pneumococcal Vaccine 43-34 Years old (4 - PPSV23 if available, else PCV20) 01/15/2023   COLONOSCOPY (Pts 45-40yr Insurance coverage will need to be confirmed)  08/24/2023   TETANUS/TDAP  07/15/2031   INFLUENZA VACCINE  Completed   Hepatitis C Screening  Completed   HIV Screening  Completed   HPV VACCINES  Aged Out    Health Maintenance  Health Maintenance Due  Topic Date Due   PAP SMEAR-Modifier  03/29/2021   MAMMOGRAM  05/04/2021   COVID-19 Vaccine (3 - Moderna risk series) 05/27/2021    Colorectal cancer screening: Type of screening: Colonoscopy. Completed 08/23/13. Repeat every 10 years. [facility]  Mammogram status: Completed 05/04/20. Repeat every year. Pt scheduled with [facility] next week  Bone Density status: Completed 2019. Results reflect: Bone density results: NORMAL. Repeat every as directed years.  Lung  Cancer Screening: (Low Dose CT Chest recommended if Age 68-80 years, 30 pack-year currently smoking OR have quit w/in 15years.) does not qualify.   Additional Screening:  Hepatitis C Screening: does qualify; Completed 05/18/21  Vision Screening: Recommended annual ophthalmology exams for early detection of glaucoma and other disorders of the eye. Is the patient up to date with their annual eye exam?  Yes  Who is the provider or what is the name of the office in which the patient attends annual eye exams? [facility].   Dental Screening:  Recommended annual dental exams for proper oral hygiene  Community Resource Referral / Chronic Care Management: CRR required this visit?  No   CCM required this visit?  No      Plan:     I have personally reviewed and noted the following in the patients chart:   Medical and social history Use of alcohol, tobacco or illicit drugs  Current medications and supplements including opioid prescriptions.  Functional ability and status Nutritional status Physical activity Advanced directives List of other physicians Hospitalizations, surgeries, and ER visits in previous 12 months Vitals Screenings to include cognitive, depression, and falls Referrals and appointments  In addition, I have reviewed and discussed with patient certain preventive protocols, quality metrics, and best practice recommendations. A written personalized care plan for preventive services as well as general preventive health recommendations were provided to patient.     Clemetine Marker, LPN   09/26/2409   Nurse Notes: pt states increased depression and feeling anxious since her last fall approx 2 weeks ago because she is unsure how or why she fell and when she stood up she fell again. Pt also concerned about lung function; seeing pulmonology at [facility]. Pt plans to discuss medications with psychiatry.

## 2021-08-24 ENCOUNTER — Ambulatory Visit: Admit: 2021-08-24 | Discharge: 2021-08-25 | Payer: MEDICARE

## 2021-08-24 DIAGNOSIS — S62616D Displaced fracture of proximal phalanx of right little finger, subsequent encounter for fracture with routine healing: Secondary | ICD-10-CM | POA: Diagnosis not present

## 2021-08-24 DIAGNOSIS — M25641 Stiffness of right hand, not elsewhere classified: Secondary | ICD-10-CM | POA: Diagnosis not present

## 2021-08-24 DIAGNOSIS — S62616A Displaced fracture of proximal phalanx of right little finger, initial encounter for closed fracture: Secondary | ICD-10-CM | POA: Diagnosis not present
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
## 2021-08-24 NOTE — Unmapped (Signed)
Thank you for choosing [facility]!  We appreciate the opportunity to participate in your care.      If any questions or concerns arise after your visit, please do not hesitant to contact me by [facility] MyChart or by calling the hand team at [phone].      Voicemail messages: Messages are checked between 8:00 am- 4:00 pm Monday- Friday.    MyChart messages: These messages are checked by the nurses during normal business hours 8:30 am-4:30 pm Monday-Friday every 24-48 hours and are for non-urgent, non-emergent concerns. You may be asked to return for a follow up visit if it is deemed your questions are best handled in the clinic setting.    Please let me know if I can be of assistance with this or other orthopaedic issues in the future.

## 2021-08-26 ENCOUNTER — Ambulatory Visit: Admit: 2021-08-26 | Discharge: 2021-08-26 | Payer: MEDICARE

## 2021-08-26 DIAGNOSIS — R9389 Abnormal findings on diagnostic imaging of other specified body structures: Secondary | ICD-10-CM | POA: Diagnosis not present

## 2021-08-26 DIAGNOSIS — Z1231 Encounter for screening mammogram for malignant neoplasm of breast: Secondary | ICD-10-CM | POA: Diagnosis not present

## 2021-08-26 DIAGNOSIS — J986 Disorders of diaphragm: Secondary | ICD-10-CM | POA: Diagnosis not present

## 2021-08-26 DIAGNOSIS — Z803 Family history of malignant neoplasm of breast: Secondary | ICD-10-CM | POA: Diagnosis not present

## 2021-08-26 LAB — HM MAMMOGRAPHY
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
## 2021-08-27 ENCOUNTER — Ambulatory Visit: Admit: 2021-08-27 | Discharge: 2021-08-28 | Payer: MEDICARE

## 2021-08-27 DIAGNOSIS — J8489 Other specified interstitial pulmonary diseases: Secondary | ICD-10-CM | POA: Diagnosis not present

## 2021-08-27 DIAGNOSIS — R0600 Dyspnea, unspecified: Secondary | ICD-10-CM | POA: Diagnosis not present

## 2021-08-31 ENCOUNTER — Ambulatory Visit: Payer: Self-pay

## 2021-08-31 NOTE — Telephone Encounter (Signed)
Chief Complaint: Itching all over Symptoms: Itching Frequency: Started back after finishing prn medication given on 07/14/21, hydroxyzine Pertinent Negatives: Patient denies other symptoms Disposition: [] ED /[] Urgent Care (no appt availability in office) / [x] Appointment(In office/virtual)/ []  [facility]/ [] Home Care/ [] Refused Recommended Disposition  Additional Notes: N/A    Reason for Disposition  [1] Widespread itching AND [2] cause unknown AND [3] present > 48 hours (Exception: caller knows the cause and can eliminate it)  Answer Assessment - Initial Assessment Questions 1. DESCRIPTION: "Describe the itching you are having."     Skin feels dry 2. SEVERITY: "How bad is it?"    - MILD - doesn't interfere with normal activities   - MODERATE-SEVERE: interferes with work, school, sleep, or other activities      Moderate-severe 3. SCRATCHING: "Are there any scratch marks? Bleeding?"     No 4. ONSET: "When did this begin?"      Around beginning of November (med prescribed on 07/14/21) 5. CAUSE: "What do you think is causing the itching?" (ask about swimming pools, pollen, animals, soaps, etc.)     I don't know 6. OTHER SYMPTOMS: "Do you have any other symptoms?"      No 7. PREGNANCY: "Is there any chance you are pregnant?" "When was your last menstrual period?"     N/A  Protocols used: Itching - [facility]

## 2021-09-01 ENCOUNTER — Telehealth (INDEPENDENT_AMBULATORY_CARE_PROVIDER_SITE_OTHER): Payer: Medicare Other | Admitting: Nurse Practitioner

## 2021-09-01 ENCOUNTER — Encounter: Payer: Self-pay | Admitting: Nurse Practitioner

## 2021-09-01 ENCOUNTER — Other Ambulatory Visit: Payer: Self-pay

## 2021-09-01 DIAGNOSIS — L299 Pruritus, unspecified: Secondary | ICD-10-CM | POA: Diagnosis not present

## 2021-09-01 DIAGNOSIS — I152 Hypertension secondary to endocrine disorders: Principal | ICD-10-CM

## 2021-09-01 DIAGNOSIS — E1159 Type 2 diabetes mellitus with other circulatory complications: Principal | ICD-10-CM

## 2021-09-01 DIAGNOSIS — R0902 Hypoxemia: Principal | ICD-10-CM

## 2021-09-01 DIAGNOSIS — E1169 Type 2 diabetes mellitus with other specified complication: Principal | ICD-10-CM

## 2021-09-01 DIAGNOSIS — E669 Obesity, unspecified: Principal | ICD-10-CM

## 2021-09-01 MED ORDER — HYDROXYZINE HCL 10 MG PO TABS
10.0000 mg | ORAL_TABLET | Freq: Three times a day (TID) | ORAL | 0 refills | Status: DC | PRN
Start: 2021-09-01 — End: 2021-12-20

## 2021-09-01 NOTE — Progress Notes (Signed)
Name: Nicole Ballard   MRN: [id number]    DOB: April 06, 1956   Date:09/01/2021       Progress Note  Subjective  Chief Complaint  Chief Complaint  Patient presents with   Pruritis    On and off    I connected with  Nicole Ballard  on 09/01/21 at 10:36 am by a video enabled telemedicine application and verified that I am speaking with the correct person using two identifiers.  I discussed the limitations of evaluation and management by telemedicine and the availability of in person appointments. The patient expressed understanding and agreed to proceed with a virtual visit  Staff also discussed with the patient that there may be a patient responsible charge related to this service. Patient Location: home Provider Location: [facility] Additional Individuals present: alone  HPI  Pruritus: She says she just itches all over.  She says it comes and goes.  Some days she does not itch at all and others she could be itching all day.  She denies changing any medications, soaps, laundry detergents, or lotions.  She denies any rash.  She says she was seen for this back in November with Dr. Ancil Boozer.  She was given hydroxyzine and she says that she had a little improvement.  Discussed trying pepcid and hydroxyzine to see if that helps.  Will also put in a referral for allergy testing.   Patient Active Problem List   Diagnosis Date Noted   Immunocompromised ([place]) 04/29/2021   Hyperglycemia 10/23/2017   Vertigo 09/29/2016   Bronchiolitis obliterans organizing pneumonia ([place]) 09/29/2016   Neck pain, musculoskeletal 12/15/2015   Abnormal CAT scan 04/11/2015   Auditory impairment 04/11/2015   Insomnia, persistent 04/11/2015   Chronic kidney disease (CKD), stage I 04/11/2015   Chronic nonmalignant pain 04/11/2015   Chronic constipation 04/11/2015   Dyslipidemia 04/11/2015   Fibromyalgia 04/11/2015   Gastro-esophageal reflux disease without esophagitis 04/11/2015   Dysphonia 04/11/2015   Low back pain  04/11/2015   Eczema intertrigo 04/11/2015   Keratosis pilaris 04/11/2015   Chronic recurrent major depressive disorder ([place]) 40/98/1191   Dysmetabolic syndrome 47/82/9562   Neurogenic claudication ([place]) 04/11/2015   Perennial allergic rhinitis with seasonal variation 04/11/2015   Abnormal presence of protein in urine 04/11/2015   Seborrhea capitis 04/11/2015   Moderate dysplasia of vulva 04/11/2015   Vitamin D deficiency 04/11/2015   Spinal stenosis of lumbar region 04/11/2015   Treadmill stress test negative for angina pectoris 03/05/2015   Shortness of breath 05/20/2014   Asthma, chronic 12/31/2013   H/O total knee replacement 12/31/2013   Episcleritis 09/26/2013   Vocal cord dysfunction 05/28/2013   Anxiety, generalized 03/19/2013   Back pain 12/18/2012   Pulmonary nodules 11/26/2012   Lung nodule, multiple 11/26/2012   Arthritis, degenerative 11/01/2012   H/O aspiration pneumonitis 10/22/2012   Postinflammatory pulmonary fibrosis ([place]) 10/22/2012   Mixed incontinence 05/04/2012   Lung involvement in systemic lupus erythematosus ([place]) 11/05/2010   Chronic nausea 07/01/2008   Benign hypertension 01/25/2005    Social History   Tobacco Use   Smoking status: Never   Smokeless tobacco: Never  Substance Use Topics   Alcohol use: No    Alcohol/week: 0.0 standard drinks     Current Outpatient Medications:    albuterol (VENTOLIN HFA) 108 (90 Base) MCG/ACT inhaler, SMARTSIG:2 Puff(s) By Mouth Every 6 Hours PRN, Disp: , Rfl:    amLODipine (NORVASC) 2.5 MG tablet, TAKE 3 TABLETS BY MOUTH  DAILY, Disp: 270 tablet, Rfl:  1   aspirin EC 81 MG tablet, Take 1 tablet (81 mg total) by mouth daily., Disp: 30 tablet, Rfl: 0   atenolol (TENORMIN) 25 MG tablet, Take 1 tablet (25 mg total) by mouth daily., Disp: 90 tablet, Rfl: 3   atorvastatin (LIPITOR) 40 MG tablet, TAKE 1 TABLET BY MOUTH  DAILY, Disp: 90 tablet, Rfl: 2   Biotin 1 MG CAPS, Take 1 capsule by mouth daily., Disp: 30 capsule,  Rfl: 0   buPROPion (WELLBUTRIN XL) 150 MG 24 hr tablet, Take 150 mg by mouth every morning., Disp: , Rfl:    Cholecalciferol (VITAMIN D) 2000 UNITS CAPS, Take 1 capsule by mouth daily., Disp: , Rfl:    clobetasol ointment (TEMOVATE) 0.01 %, Apply 1 application topically 2 (two) times daily., Disp: , Rfl:    clonazePAM (KLONOPIN) 0.5 MG tablet, Take by mouth., Disp: , Rfl:    clotrimazole-betamethasone (LOTRISONE) cream, Apply 1 application topically 2 (two) times daily., Disp: 45 g, Rfl: 0   cyclobenzaprine (FLEXERIL) 5 MG tablet, Take 1-2 tablets (5-10 mg total) by mouth 3 (three) times daily as needed for muscle spasms., Disp: 20 tablet, Rfl: 0   diclofenac (VOLTAREN) 75 MG EC tablet, Take 1 tablet (75 mg total) by mouth 2 (two) times daily., Disp: 20 tablet, Rfl: 0   diclofenac Sodium (VOLTAREN) 1 % GEL, Apply topically daily as needed., Disp: , Rfl:    fluticasone (FLONASE) 50 MCG/ACT nasal spray, Place 2 sprays into both nostrils daily., Disp: 48 g, Rfl: 3   fluticasone (FLOVENT HFA) 220 MCG/ACT inhaler, Inhale into the lungs., Disp: , Rfl:    gabapentin (NEURONTIN) 300 MG capsule, Take 300 mg (1 capsule) in the morning and at 300 mg (1 capsule) at noon and 600 mg (2 capsules) at night., Disp: , Rfl:    hydrALAZINE (APRESOLINE) 10 MG tablet, TAKE 1 TABLET BY MOUTH THREE TIMES DAILY AS NEEDED FOR BLOOD PRESSURE ABOVE 150/90, Disp: 90 tablet, Rfl: 2   hydroxychloroquine (PLAQUENIL) 200 MG tablet, Take by mouth 2 (two) times daily., Disp: , Rfl:    hydrOXYzine (ATARAX/VISTARIL) 10 MG tablet, Take 1 tablet (10 mg total) by mouth 3 (three) times daily as needed., Disp: 30 tablet, Rfl: 0   lamoTRIgine (LAMICTAL) 200 MG tablet, Take 200 mg by mouth 2 (two) times daily., Disp: , Rfl:    LINZESS 145 MCG CAPS capsule, Take 145 mcg by mouth daily., Disp: , Rfl:    loratadine (CLARITIN) 10 MG tablet, Take 1 tablet (10 mg total) by mouth daily., Disp: 90 tablet, Rfl: 1   Melatonin 10 MG TABS, Take 1 tablet  by mouth at bedtime., Disp: , Rfl:    mycophenolate (MYFORTIC) 360 MG TBEC EC tablet, Take 360 mg by mouth 2 (two) times daily., Disp: , Rfl:    olopatadine (PATANOL) 0.1 % ophthalmic solution, Place 1 drop into both eyes 2 (two) times daily., Disp: 5 mL, Rfl: 1   omeprazole (PRILOSEC) 40 MG capsule, TAKE 1 CAPSULE BY MOUTH  DAILY, Disp: 90 capsule, Rfl: 3   Polyethylene Glycol 3350 (MIRALAX PO), Take by mouth., Disp: , Rfl:    pregabalin (LYRICA) 50 MG capsule, , Disp: , Rfl:    telmisartan (MICARDIS) 80 MG tablet, TAKE 1 TABLET BY MOUTH  DAILY, Disp: 90 tablet, Rfl: 1   traZODone (DESYREL) 100 MG tablet, Take 150 mg by mouth at bedtime., Disp: , Rfl:    triamcinolone ointment (KENALOG) 0.1 %, , Disp: , Rfl:    venlafaxine (  EFFEXOR) 100 MG tablet, Take 100 mg by mouth daily., Disp: , Rfl:    venlafaxine XR (EFFEXOR-XR) 37.5 MG 24 hr capsule, Take 37.5 mg by mouth 2 (two) times daily., Disp: , Rfl:    famotidine (PEPCID) 20 MG tablet, Take by mouth., Disp: , Rfl:   Allergies  Allergen Reactions   Ace Inhibitors     Other reaction(s): OTHER Pt states she can not take ace inhibitors. Pt states she can not remember her reaction.    Bee Pollen    Penicillins    Pollen Extract     I personally reviewed active problem list, medication list, allergies, notes from last encounter with the patient/caregiver today.  ROS  Constitutional: Negative for fever or weight change.  Respiratory: Negative for cough and shortness of breath.   Cardiovascular: Negative for chest pain or palpitations.  Gastrointestinal: Negative for abdominal pain, no bowel changes.  Musculoskeletal: Negative for gait problem or joint swelling.  Skin: Negative for rash. Positive for itching all over Neurological: Negative for dizziness or headache.  No other specific complaints in a complete review of systems (except as listed in HPI above).   Objective  Virtual encounter, vitals not obtained.  There is no height or  weight on file to calculate BMI.  Nursing Note and Vital Signs reviewed.  Physical Exam  Awake, alert and oriented  No results found for this or any previous visit (from the past 62 hour(s)).  Assessment & Plan  1. Generalized pruritus -take pepcid when itching - Ambulatory referral to Allergy - hydrOXYzine (ATARAX) 10 MG tablet; Take 1 tablet (10 mg total) by mouth 3 (three) times daily as needed.  Dispense: 30 tablet; Refill: 0   -Red flags and when to present for emergency care or RTC including fever >101.77F, chest pain, shortness of breath, new/worsening/un-resolving symptoms, reviewed with patient at time of visit. Follow up and care instructions discussed and provided in AVS. - I discussed the assessment and treatment plan with the patient. The patient was provided an opportunity to ask questions and all were answered. The patient agreed with the plan and demonstrated an understanding of the instructions.  I provided 15 minutes of non-face-to-face time during this encounter.  Bo Merino, FNP

## 2021-09-01 NOTE — Addendum Note (Signed)
Addended by: Serafina Royals F on: 09/01/2021 11:20 AM   Modules accepted: Orders

## 2021-09-02 DIAGNOSIS — R6889 Other general symptoms and signs: Secondary | ICD-10-CM | POA: Diagnosis not present

## 2021-09-02 DIAGNOSIS — Z03818 Encounter for observation for suspected exposure to other biological agents ruled out: Secondary | ICD-10-CM | POA: Diagnosis not present

## 2021-09-02 MED ORDER — PEG 3350-ELECTROLYTES 236 GRAM-22.74 GRAM-6.74 GRAM-5.86 GRAM SOLUTION
Freq: Once | ORAL | 0 refills | 1 days | Status: CP
Start: 2021-09-02 — End: 2021-09-03

## 2021-09-02 MED ORDER — POLYETHYLENE GLYCOL 3350 17 GRAM/DOSE ORAL POWDER
0 refills | 0 days | Status: CP
Start: 2021-09-02 — End: ?

## 2021-09-02 MED ORDER — BISACODYL 5 MG TABLET,DELAYED RELEASE
ORAL_TABLET | Freq: Once | ORAL | 0 refills | 1 days | Status: CP
Start: 2021-09-02 — End: 2021-09-03
  Filled 2021-09-06: qty 2, 1d supply, fill #0

## 2021-09-03 NOTE — Unmapped (Signed)
Patient has enough medication on hand, rescheduling refill call for 2/24

## 2021-09-06 ENCOUNTER — Encounter: Payer: Self-pay | Admitting: Family Medicine

## 2021-09-06 MED FILL — POLYETHYLENE GLYCOL 3350 17 GRAM/DOSE ORAL POWDER: 1 days supply | Qty: 238 | Fill #0

## 2021-09-06 MED FILL — PEG 3350-ELECTROLYTES 236 GRAM-22.74 GRAM-6.74 GRAM-5.86 GRAM SOLUTION: ORAL | 1 days supply | Qty: 4000 | Fill #0

## 2021-09-07 ENCOUNTER — Encounter: Payer: Self-pay | Admitting: Unknown Physician Specialty

## 2021-09-07 ENCOUNTER — Ambulatory Visit: Payer: Self-pay | Admitting: *Deleted

## 2021-09-07 ENCOUNTER — Telehealth (INDEPENDENT_AMBULATORY_CARE_PROVIDER_SITE_OTHER): Payer: Commercial Managed Care - HMO | Admitting: Unknown Physician Specialty

## 2021-09-07 DIAGNOSIS — R058 Other specified cough: Secondary | ICD-10-CM

## 2021-09-07 DIAGNOSIS — J069 Acute upper respiratory infection, unspecified: Secondary | ICD-10-CM

## 2021-09-07 MED ORDER — DOXYCYCLINE HYCLATE 100 MG PO TABS
100.0000 mg | ORAL_TABLET | Freq: Two times a day (BID) | ORAL | 0 refills | Status: DC
Start: 2021-09-07 — End: 2021-10-01

## 2021-09-07 NOTE — Telephone Encounter (Signed)
Copied from [facility] [id number]. Topic: General - Inquiry >> Sep 07, 2021  9:49 AM Robina Ade, Helene Kelp D wrote: Reason for CRM: Patient called and would like to speak with Dr. Ancil Boozer or her CMA about a bad cold she has had for couple of days and even with antibiotic she is not better. She wants to know what else can she do or take for it. Please call patient back, thanks.

## 2021-09-07 NOTE — Telephone Encounter (Signed)
Please see triage note from 09/07/21.

## 2021-09-07 NOTE — Progress Notes (Signed)
There were no vitals taken for this visit.   Subjective:    Patient ID: Nicole Ballard, female    DOB: 08-Feb-1956, 66 y.o.   MRN: [id number]  HPI: Nicole Ballard is a 66 y.o. female  Chief Complaint  Patient presents with   URI   This visit was completed via telephone due to the restrictions of the COVID-19 pandemic. All issues as above were discussed and addressed but no physical exam was performed. If it was felt that the patient should be evaluated in the office, they were directed there. The patient verbally consented to this visit. Patient was unable to complete an audio/visual visit due to Technical difficulties. Location of the patient: home Location of the provider: work Those involved with this call:  Provider: Kathrine Haddock CMA: Larene Beach Time spent on call: 20 minutes I verified patient identity using two factors (patient name and date of birth). Patient consents verbally to being seen via telemedicine visit today.    Pt with 8 days of illness.  Went to Urgent care about 5 days ago.  Diagnosed with a URI.  Covid and flu were negative at the time.  Received cough medication.  She has a bad cough with yellow/green phlegm.  Unable to sleep.  No appetite. No fever.  No chills, but gets hot and sweaty.  Non-smoker.  No antibiotics.  No nasal congestion and sore throat is resolved.  Feels like level of illness is unchanged.  Hx significant for l lupus with lung involvement and dibetes.  Allergic to PCN.    Past Medical History:  Diagnosis Date   Anxiety    Bipolar 1 disorder ([place])    Chronic kidney disease    Chronic pain    Diabetes mellitus without complication (H[facility])    Fibromyalgia    GERD (gastroesophageal reflux disease)    Hearing loss of right ear    Hemifacial spasm    Hyperlipidemia    Hypertension    Insomnia    Lumbago    Osteoarthritis    Paresthesia    Rhinitis    Systemic lupus erythematosus ([place])    Vitamin D deficiency      Relevant past  medical, surgical, family and social history reviewed and updated as indicated. Interim medical history since our last visit reviewed. Allergies and medications reviewed and updated.  Review of Systems  Constitutional:  Positive for fatigue. Negative for fever and unexpected weight change.  HENT: Negative.    Respiratory:  Positive for cough and shortness of breath.   Cardiovascular:  Negative for chest pain.  Musculoskeletal:  Negative for arthralgias.   Per HPI unless specifically indicated above     Objective:    There were no vitals taken for this visit.  Wt Readings from Last 3 Encounters:  08/19/21 240 lb 8 oz (109.1 kg)  07/14/21 241 lb (109.3 kg)  06/15/21 239 lb (108.4 kg)    Physical Exam Neurological:     Mental Status: She is alert.  Psychiatric:        Behavior: Behavior normal.        Thought Content: Thought content normal.    Results for orders placed or performed in visit on 06/15/21  Lipid panel  Result Value Ref Range   Cholesterol 154 <200 mg/dL   HDL 62 > OR = 50 mg/dL   Triglycerides 89 <150 mg/dL   LDL Cholesterol (Calc) 75 mg/dL (calc)   Total CHOL/HDL Ratio 2.5 <5.0 (calc)   Non-HDL  Cholesterol (Calc) 92 <130 mg/dL (calc)  COMPLETE METABOLIC PANEL WITH GFR  Result Value Ref Range   Glucose, Bld 83 65 - 99 mg/dL   BUN 15 7 - 25 mg/dL   Creat 1.00 0.50 - 1.05 mg/dL   eGFR 63 > OR = 60 mL/min/1.27m   BUN/Creatinine Ratio NOT APPLICABLE 6 - 22 (calc)   Sodium 140 135 - 146 mmol/L   Potassium 4.7 3.5 - 5.3 mmol/L   Chloride 102 98 - 110 mmol/L   CO2 32 20 - 32 mmol/L   Calcium 9.4 8.6 - 10.4 mg/dL   Total Protein 7.0 6.1 - 8.1 g/dL   Albumin 3.6 3.6 - 5.1 g/dL   Globulin 3.4 1.9 - 3.7 g/dL (calc)   AG Ratio 1.1 1.0 - 2.5 (calc)   Total Bilirubin 0.3 0.2 - 1.2 mg/dL   Alkaline phosphatase (APISO) 72 37 - 153 U/L   AST 20 10 - 35 U/L   ALT 13 6 - 29 U/L  Hemoglobin A1c  Result Value Ref Range   Hgb A1c MFr Bld 5.5 <5.7 % of total Hgb    Mean Plasma Glucose 111 mg/dL   eAG (mmol/L) 6.2 mmol/L      Assessment & Plan:   Problem List Items Addressed This Visit   None Visit Diagnoses     Viral upper respiratory tract infection    -  Primary   Productive cough       Ill for 8 days with multiple co-morbid conditions including lupus with lung involvement.  Will rx Doxycycline BID for 7 days. RTC no improvement        Follow up plan: Return if symptoms worsen or fail to improve.

## 2021-09-07 NOTE — Telephone Encounter (Signed)
Patient is scheduled for virtual visit with Kathrine Haddock, today at 3:00pm.

## 2021-09-07 NOTE — Telephone Encounter (Signed)
°  Chief Complaint: continued coughing x 1 week , Symptoms:  hoarse , coughing up yellow / green  Frequency: x 1 week  Pertinent Negatives: Patient denies difficulty breathing  Disposition: [] ED /[] Urgent Care (no appt availability in office) / [x] Appointment(In office/virtual)/ []  [facility]/ [] Home Care/ [] Refused Recommended Disposition /[] [facility]/ []  Follow-up with PCP Additional Notes:   Call transferred to [facility]  to schedule appt       Reason for Disposition  [1] Continuous (nonstop) coughing interferes with work or school AND [2] no improvement using cough treatment per Care Advice    Interferes with sleep and causes  urinary incont  Answer Assessment - Initial Assessment Questions 1. ONSET: "When did the cough begin?"      X 1 week ago  2. SEVERITY: "How bad is the cough today?"      Getting worse  3. SPUTUM: "Describe the color of your sputum" (none, dry cough; clear, white, yellow, green)     Yellow ,green  4. HEMOPTYSIS: "Are you coughing up any blood?" If so ask: "How much?" (flecks, streaks, tablespoons, etc.)     na 5. DIFFICULTY BREATHING: "Are you having difficulty breathing?" If Yes, ask: "How bad is it?" (e.g., mild, moderate, severe)    - MILD: No SOB at rest, mild SOB with walking, speaks normally in sentences, can lie down, no retractions, pulse < 100.    - MODERATE: SOB at rest, SOB with minimal exertion and prefers to sit, cannot lie down flat, speaks in phrases, mild retractions, audible wheezing, pulse 100-120.    - SEVERE: Very SOB at rest, speaks in single words, struggling to breathe, sitting hunched forward, retractions, pulse > 120      After coughing spell and use inhalers  6. FEVER: "Do you have a fever?" If Yes, ask: "What is your temperature, how was it measured, and when did it start?"     na 7. CARDIAC HISTORY: "Do you have any history of heart disease?" (e.g., heart attack, congestive heart failure)      na 8.  LUNG HISTORY: "Do you have any history of lung disease?"  (e.g., pulmonary embolus, asthma, emphysema)     Lung disease  9. PE RISK FACTORS: "Do you have a history of blood clots?" (or: recent major surgery, recent prolonged travel, bedridden)     na 10. OTHER SYMPTOMS: "Do you have any other symptoms?" (e.g., runny nose, wheezing, chest pain)       Cough, hoarse  11. PREGNANCY: "Is there any chance you are pregnant?" "When was your last menstrual period?"       na 12. TRAVEL: "Have you traveled out of the country in the last month?" (e.g., travel history, exposures)       na  Protocols used: Cough - Acute Productive-A-AH

## 2021-09-10 ENCOUNTER — Institutional Professional Consult (permissible substitution): Admit: 2021-09-10 | Discharge: 2021-09-11 | Payer: MEDICARE | Attending: Psychologist | Primary: Psychologist

## 2021-09-10 NOTE — Unmapped (Signed)
[facility]   Confidential Psychological Therapy Session      Patient Name: Caitlin Smith  Medical Record Number: [id number]  Date of Service: September 10, 2021  Attending Psychologist: Caroline More, PhD  CPT Procedure Codes: 81191 for 45 mins of face to face counseling    Due to the current declared state emergency during the coronavirus pandemic, all non-urgent medical and mental health visits have been triaged to either be delayed or take place virtually via phone or videoconferencing.   Due to the need for continued patient interaction for mental health care, pain management, and care coordination that necessitates the involvement of this provider, this visit was performed face to face using interactive technology using a HIPPA compliant audio/visual platform. This patient will be scheduled for face to face visits in the future once it has been deemed safe.      We reviewed confidentiality today. The patient was present at home (location and contact information confirmed), attended this visit alone, and consented to this virtual pain psychology visit.     Visit modifiers:   GT for Interactive Technology and CR for catastrophe/disaster related due to coronavirus pandemic    REFERRING PHYSICIAN: Clarene Essex, MD    CHIEF COMPLAINT AND REASON FOR VISIT: pain coping skills, CBT to address depression and anxiety in the setting of chronic pain    SUBJECTIVE / HISTORY OF PRESENT ILLNESS: [name] is a very pleasant 66 y.o.  female from [place], [place] with multiple chronic pain complaints related to fibromyalgia and lupus who initially met with me in October 2016, and which time she was diagnosed with severe depression, PTSD, panic disorder, and generalized anxiety.The patient returns for a therapy session today. Last follow up with me was 08/26/2021.     Patient reports mood to be worse, Smith sad and Smith down the last 2 to 3 weeks.  She denies any SI or safety concerns.  She had been followed by [facility] psychiatry but was told by her most recent psychiatry resident student that after 2 appointments the resident was transitioning off service.  The patient has not been contacted to schedule follow-up appointment.  We reviewed problem solving today, the patient ultimately plans to contact the psychiatry clinic line, requested appointment with a new provider and discussed depression and anxiety concerns.  The patient reviews her pattern of mood changes throughout the year.  Typically pain tends to be worse in the winter, associated with weather changes.  She also notices when she gets outside and walks, her mood and pain tend to improve.  She has not been exercising much, partly due to the cold weather, so we again problem solve this.  The patient is going to look into exercising at the gym or walking in a warm indoor facility such as a shopping center.  Also discussed finding a body and walking with someone else that she enjoys talking to.  She has done this in the past as well.  Discussed getting early morning sunlight.  She continues on venlafaxine.we processed thoughts and feelings about mood, pain, and in particular loneliness and connected this back to physiological sensations.  We practiced mindful breathing and body scan as well.      OBJECTIVE / MENTAL STATUS:    Appearance:   Appears stated age and Clean/Neat   Motor:  No abnormal movements   Speech/Language:   Normal rate, volume, tone, fluency   Mood:  Depressed and Anxious   Affect:  blunted  Thought process:  Logical, linear, clear, coherent, goal directed   Thought content:    Denies SI, HI, self harm, delusions, obsessions, paranoid ideation, or ideas of reference   Perceptual disturbances:    Denies auditory and visual hallucinations, behavior not concerning for response to internal stimuli   Orientation:  Oriented to person, place, time, and general circumstances   Attention:  Able to fully attend without fluctuations in consciousness   Concentration:  Able to fully concentrate and attend   Memory:  Immediate, short-term, long-term, and recall grossly intact    Fund of knowledge:   Consistent with level of education and development   Insight:    Fair   Judgment:   Intact   Impulse Control:  Intact     DIAGNOSTIC IMPRESSION:   Post Traumatic Stress Disorder (PTSD)  Generalized Anxiety Disorder (GAD)  Panic Disorder  Major Depressive Disorder, moderate, recurrent  Chronic pain syndrome  Fibromyalgia  Lupus    ASSESSMENT:   [name] is a very pleasant 66 y.o.  female from [place], [place] with multiple chronic pain complaints related to lupus, arthritis, and fibromyalgia. She was previously seen in our clinic from 2012-2014, and reestablished care with Dr. Fayrene Smith in August 2016. The patient is struggling with depression and anxiety, but is very motivated to participate in intensive, multidisciplinary treatment to address the connection between depression, anxiety and pain. She has a long-standing history of depression and anxiety, and has worked with outpatient psychiatrist and therapist over the years. She is currently established with [facility] psychiatry, and tapered off Klonopin sometime ago and continues on venlafaxine.  She needs to connect with a new psychiatry provider. In general, depression, anxiety, pain coping and panic have all improved, but the patient evidences a seasonal affective pattern of mood changes typically her mood tends to be worse during the winter.  Has been diagnosed with diabetes, and is managing it well with p.o. medication and dietary changes.  Had lost 50+ pounds but regained 20 lbs recently.      PLAN:   (1) Psychotherapy - Continue CBT. CBT will be used to address PTSD, MDD, Panic D/o, GAD, and chronic pain.     --Behavioral Activation - Encouraged behavioral activation.  Is doing better - going to softball games weekly  --Caitlin Smith and uses Insight Timer, guided relaxation app, for nightly practice.  --Continues to utilize diaphragmatic breathing daily    (2) Psychiatry - Pt currently established with [facility].  Agrees to contact the scheduling line and connect with a new provider since she has been transition from 1 provider to another but has not yet been scheduled.    (3) Safety - Pt denies any current or recent SI or safety concerns. Knows to call 911 or go to her local ED. Also previously given [facility].      (4) follow-up - with me in 1 month

## 2021-09-13 ENCOUNTER — Ambulatory Visit: Admit: 2021-09-13 | Payer: MEDICARE

## 2021-09-13 ENCOUNTER — Ambulatory Visit: Admit: 2021-09-22 | Payer: MEDICARE
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
## 2021-09-13 NOTE — Unmapped (Signed)
Pulmonary Clinic - Follow-up Visit      HISTORY:     Active Pulmonary Problems & Brief History:  Caitlin Smith is a 66 y.o. woman with many medical issues here for follow up of follow up for organizing pneumonia.     Interval History:  - 2016 and prior - patient followed at [facility]. They thought her symptoms of dyspnea were from obesity and asthma and recommended ENT evaluation. They also stated concern for chronic aspiration.   - 04/2016 - patient in [facility] system. She was thought to have multifactorial dyspnea, waxing and waning pulmonary opacities. Evaluated by pulmonary hypertension team without evidence of pulmonary hypertension. Underwent bronchoscopy with BAL and transbronchial biopsies which showed significant inflammation. DDX remained broad - CEP, COP, SLE pneumonitis, ABPA, EGPA, AEP, but it was decided to treat her with prolonged steroid taper.   - 08/23/16 - last visit with Dr. Johnnette Barrios - was improving with steroid taper   - 10/20/16 - established with Dr. Nilsa Nutting - there had been question of compliance with her prednisone since last visit, but by time of visit she had been on it consistently for one month. No clear improvement in dyspnea, but remains inactive.   - 02/09/17 - follow up - spirometry showed improvement in FVC and DLCOafter 2 months of steroid, had tapered off by time of visit. When she stopped the prednisone she developed increased cough, chest tightness, dyspnea.   - 03/27/17 - follow up - given steroid dependence was placed on MMF.   - 10/16/17 - follow up - dose reduced MMF for GI side effects. Prolonged steroid taper over this time.   - 02/06/2019 - last visit with pulmonary, Dr. Judeth Horn - at that time had gone to local ED for chest pain, CT chest showed interstitial lung disease in the lung bases, favored to reflect mild progression of chronic fibrotic nonspecific interstitial pneumonia (NSIP). It was unclear to Dr. Judeth Horn if that represented actual progression of her underlying disease.   - 03/03/21 - patient reports she has dyspnea on exertion not associated with chest pain. Not currently using any inhalers. Still has some chronic cough, but ENT thinks she's having reflux so has her on PPI + H2 blocker. Cough non-productive. No F/C/NS, N/V/D, upset tummy. Sleeps laying flat, no nocturnal awakenings. Reports negative sleep studies for OSA. No LE edema. Wanted to repeat PFTs in 4 months given mildly reduced compared to prior. If stable/improved at next visit then would reach out to rheum to see about weaning MMF.   - 06/08/21 - ENT - larynx healthy on laryngoscopy. Referred to GI for GI discomfort and constipation.   - 07/04/21 - follow up - had been increasing her exercise up to 50 minutes daily until 2 weeks prior to visit when breathing worsened again, now having dyspnea with minimal exertion. Sent for CT chest, , chest fluoroscopy. CT chest slightly worse but not significantly different, TTE without obvious pathology to cause symptoms. Chest fluoroscopy with elevated left hemidiaphragm and diminished excursion compared to right but not fully paralyzed, without hypoxemia.   - 09/13/20 - follow up - had URI over Christmas and New Year. Initially just given cough medicine (brompheniramine-pseudophed), then given course of doxycycline on 09/07/21. Now with lingering hoarseness, cough productive of green-yellow mucus. Treating with albuterol which is helpful and cough syrup which is not. No F/C, but is having some night sweats where she is soaking part of her shirt. Does have some dyspnea on exertion, probably not  that worse from prior to cold. Scared to get back to walking since she fell while walking. Was having dyspnea with walking but would ease up with continued exertion. No side effects with flovent.     Past Medical History: The medical and surgical history were personally reviewed and updated in the patient's electronic medical record. Pertinent positives are documented above.  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 Pertinent positives are documented above.  Home Medications: Medications were reviewed and updated in the patient's electronic medical record. Pertinent positives are documented above.   Allergies: Allergies were reviewed and updated in the patient's electronic medical record. Pertinent positives are documented above.  Review of Systems: A comprehensive review of systems was completed and negative except as noted in HPI.    PHYSICAL EXAM:     Vitals:    09/15/21 0822   BP: 140/82   Pulse: 70   Temp: 36.2 ??C (97.2 ??F)   SpO2: 96%       General: pleasant individual appearing stated age, alert and oriented, no acute distress  HEENT: trachea midline, supple  CV: RRR, no m/r/g  Lungs: crackles in BL bases, good air movement throughout   Abd: Soft, NT/ND, no rebound or guarding  Ext: Warm, well perfused, no peripheral edema  Skin: No rashes, skin breakdown, or wounds  Neuro: CN II-XII intact to conversation, alert and oriented.     LABORATORY and RADIOLOGY DATA:     Pulmonary Function Tests/Interpretation:  07/05/21      No obstruction. Normal inspiratory flow volume loops. Moderate restriction. Moderate reduction in DLCO. DLCO reduced from prior, FEV1 and FVC unchanged.     08/11/21      Pertinent Laboratory Data:  04/25/16 BAL and TBBX   Cultures negative except for OPF     Cell count  PMN 31%  Lymph 11%  Macs 43%  Eos 7%  Baso 6%     Pathology   A: Lung, right lower lobe, endobronchial biopsy   - DIP-like reaction pattern with admixed eosinophils, consistent with eosinophilic pneumonia (see Micro Exam)  - Patchy organizing pneumonia  - Interstitial lymphoplasmacytic infiltrate, polytypic  - Partially denuded benign respiratory mucosa, without viral cytopathic effect  - No malignant neoplasm identified   - GMS and  AFB stains negative for fungi or AFB    Cytology  Lung, Bronchial lavage:  - No malignant cells identified.   - Alveolar macrophages and mixed inflammation.     Pertinent Imaging Data:  07/11/2017 CT Chest  AIRWAYS, LUNGS, PLEURA: Nearly resolved multifocal and bilateral perihilar bronchovascular nodular and consolidative opacities compared to 02/06/2017. Residual patchy right basilar ground-glass opacities persist.  New 3.2 cm superior segment right lower lobe curvilinear consolidation (image 242, series 5).  Subpleural right middle lobe cysts unchanged, may represent honeycombing.  No pleural effusion.    IMPRESSION:  Resolved multifocal, bilateral lung consolidations compared to 02/06/2017.  New right lower lobe curvilinear consolidation, indeterminate, but favor new  focus of organizing pneumonia.    02/06/17 CT Chest  AIRWAYS, LUNGS, PLEURA:   Clear central airways. No bronchiectasis or bronchial wall thickening.   Previously seen peripheral pulmonary parenchymal opacities, improved from previous study but there are new peripheral/pleural-based pulmonary parenchymal opacities, predominantly in the upper lobes.  No focal air-trapping or honeycombing.   No pleural effusion.    -Waxing and waning pulmonary parenchymal opacities. Favor cryptogenic organizing pneumonia/eosinophilic pneumonia. Can't rule out multilobar pneumonia completely. Recommend 3-6 month follow-up chest CT to document resolution.    09/09/16 HRCT Chest  Clear central airways. No bronchiectasis or bronchial wall thickening.   Interval increase in bilateral lower lobe predominant patchy multifocal consolidations. Similar appearance of subpleural reticulation at the lung bases.  No focal air-trapping or honeycombing.  No pleural effusion.    --Increase in lower lobe predominant patchy multifocal consolidations. Favor organizing pneumonia versus eosinophilic pneumonia.  --Similar appearance of bibasilar subpleural articulation, likely mild fibrosis.        Pertinent Cardiac Data:  04/14/16 TTE   ?? Left ventricular hypertrophy - mild  ?? Normal left ventricular systolic function, ejection fraction 55 to 60%  ?? Diastolic dysfunction - grade I (normal filling pressures)  ?? Normal right ventricular systolic function      ASSESSMENT and PLAN     Problem List  Connective Tissue Disease related Interstitial Lung Disease (CTD-ILD)  Cryptogenic Organizing Pneumonia (COP)  SLE   GERD  BMI 37   Left hemidiaphragm elevation    Assessment  Patient with what sounds like stable symptoms over past few months with picture muddied by fall preventing her from walking and then respiratory infection with ongoing symptoms. For now will work on increasing exertion again, continuing flovent for BOS picture, omeprazole and HOB elevation for GERD, sputum culture for continued sputum production. Will discuss at ILD conference and decide on prednisone vs not, increasing mycophenolate. PFT in 3 months at next visit.     Plan  - ILD conference  - Continue omeprazole for GERD, encouraged sleeping with HOB elevation  - Continue inhaled fluticasone for BOS  - Sputum culture  - PFT in 3 months     The patient was seen with Dr. Linward Headland and will return to clinic in 3 months.    Layla Maw MD  Pulmonary and Critical Care Fellow

## 2021-09-15 ENCOUNTER — Ambulatory Visit: Admit: 2021-09-15 | Discharge: 2021-09-16 | Payer: MEDICARE

## 2021-09-15 MED ORDER — BENZONATATE 100 MG CAPSULE
ORAL_CAPSULE | Freq: Four times a day (QID) | ORAL | 1 refills | 8.00000 days | Status: CP | PRN
Start: 2021-09-15 — End: 2022-09-15

## 2021-09-15 NOTE — Unmapped (Addendum)
Hi Caitlin Smith,    It was great meeting you today. Here is what we talked about.    1) Respiratory Infection  - Finish out doxycycline as prescribed  - We'll see what the sputum culture shows    2) Connective Tissue Related Interstitial Lung Disease (CTD-ILD)  - I will reach out to your rheumatologist about increasing the the mycophenolate dose, if OK I will send a message and change the prescription.  - Continue the flovent twice daily, the albuterol as needed  - Acid reflux can contribute to symptoms so making sure to use omeprazole 40mg  twice daily 30 minutes before breakfast and dinner as well as sleeping with head of bed elevation using a wedge pillow for example.   - Repeat breathing tests at next visit in 3 months     Please return to clinic in 3 months.     If you have any non-urgent questions or concerns please reach out by calling the clinic or via MyChart.    Thanks,  Layla Maw MD  Pulmonary and Critical Care Fellow

## 2021-09-17 NOTE — Unmapped (Signed)
Pt left message stating that she has been receiving creams from her pharmacy but none have Dr. Marcia Brash name on them. Would like the medication called into her pharmacy from Dr. Tamela Oddi.    Spoke with pt and informed that Clobetasol ointment was prescribed by Dr. Tamela Oddi 08/13/21 and receipt was confirmed by pharmacy. Pt stated that she doesn't think she has been given that yet by her pharmacy and will call the pharmacy to check availability.

## 2021-09-22 ENCOUNTER — Ambulatory Visit: Admit: 2021-09-22 | Discharge: 2021-09-23 | Payer: MEDICARE

## 2021-09-22 NOTE — Unmapped (Signed)
Thank you for choosing [facility]!  We appreciate the opportunity to participate in your care.      If any questions or concerns arise after your visit, please do not hesitant to contact me by [facility] or by calling the hand team at [phone].      Voicemail messages: Messages are checked between 8:00 am- 4:00 pm Monday- Friday.    MyChart messages: These messages are checked by the nurses during normal business hours 8:30 am-4:30 pm Monday-Friday every 24-48 hours and are for non-urgent, non-emergent concerns. You may be asked to return for a follow up visit if it is deemed your questions are best handled in the clinic setting.    Please let me know if I can be of assistance with this or other orthopaedic issues in the future.     Patient Education        Finger: Exercises  Introduction  Here are some examples of exercises for you to try. The exercises may be suggested for a condition or for rehabilitation. Start each exercise slowly. Ease off the exercises if you start to have pain.  You will be told when to start these exercises and which ones will work best for you.  How to do the exercises  Tendon glides  In this exercise, the steps follow one another to make a continuous movement.  With one hand, point your fingers and thumb straight up. Your wrist should be relaxed, following the line of your fingers and thumb.  Curl your fingers so that the top two joints in them are bent, and your fingers wrap down. Your fingertips should touch or be near the base of your fingers. Your fingers will look like a hook.  Make a fist by bending your knuckles. Your thumb can gently rest against your index (pointing) finger.  Unwind your fingers slightly so that your fingertips can touch the base of your palm. Your thumb can rest against your index finger. Hold that position for about 6 seconds.  Move back to your starting position, with your fingers and thumb pointing up.  Repeat the series of motions 8 to 12 times.  Switch hands, and repeat steps 1 through 6.  Thumb flexion/extension  Place your forearm and hand on a table with your thumb pointing up.  Bend your thumb downward and across your palm so that your thumb touches the base of your little finger. Hold that position for about 6 seconds. Then straighten your thumb.  Repeat 8 to 12 times.  Switch hands, and repeat steps 1 through 3.  Thumb abduction/adduction  With one hand, point your fingers and thumb straight up. Your wrist should be relaxed, following the line of your fingers and thumb.  Pull your thumb away from your palm as far as you can. Hold that position for about 6 seconds. Then slowly move your thumb back to the starting position, with your thumb resting against your index (pointing) finger.  Repeat 8 to 12 times.  Switch hands, and repeat steps 1 through 3.  Finger opposition  With one hand, point your fingers and thumb straight up. Your wrist should be relaxed, following the line of your fingers and thumb.  Touch your thumb to each finger, one finger at a time. This will look like an okay sign, but try to keep your other fingers straight and pointing upward as much as you can.  Repeat 8 to 12 times.  Switch hands, and repeat steps 1 through 3.  Follow-up care is a key part of your treatment and safety. Be sure to make and go to all appointments, and call your doctor if you are having problems. It's also a good idea to know your test results and keep a list of the medicines you take.  Current as of: November 11, 2020               Content Version: 13.5  ?? 2006-2022 [employer].   Care instructions adapted under license by [facility]. If you have questions about a medical condition or this instruction, always ask your healthcare professional. [employer] disclaims any warranty or liability for your use of this information.
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
## 2021-09-29 ENCOUNTER — Ambulatory Visit
Admit: 2021-09-29 | Discharge: 2021-09-30 | Payer: MEDICARE | Attending: Student in an Organized Health Care Education/Training Program | Primary: Student in an Organized Health Care Education/Training Program

## 2021-09-29 DIAGNOSIS — L818 Other specified disorders of pigmentation: Principal | ICD-10-CM

## 2021-09-29 DIAGNOSIS — L409 Psoriasis, unspecified: Principal | ICD-10-CM

## 2021-09-29 MED ORDER — TRIAMCINOLONE ACETONIDE 0.1 % TOPICAL OINTMENT
INTRAMUSCULAR | 1 refills | 0.00000 days | Status: CP
Start: 2021-09-29 — End: ?

## 2021-09-29 NOTE — Unmapped (Signed)
Dermatology Note     Assessment and Plan:      Inverse Psoriasis  - Recommend a barrier cream (zinc oxide, Desitin) in skin fold daily.  - Recommend using triamcinolone ointment sparingly for flares. Counseled on appropriate use of topical steroids. Discussed the common side effects of topical steroids including epidermal atrophy, striae formation, hypopigmentation.  -??Of note, she is not a good candidate for MTX given history of eosinophilic pneumonitis- rheumatology and pulmonology agree that this would not be a suitable option for her given her pulmonary history.  - Could consider adding an oral agent if she continues to flare, possibly Otezla (although has mood d/o).??    Hypopigmented linear patches on R leg  - looks post-inflammatory and secondary to excoriations  - recommend regular emollient use    The patient was advised to call for an appointment should any new, changing, or symptomatic lesions develop.     RTC: Return in about 6 months (around 03/29/2022) for inverse psoriasis. or sooner as needed   _________________________________________________________________      Chief Complaint     Chief Complaint   Patient presents with   ??? Psoriasis     Follow up for psoriasis and hypopigmented patches on right leg  Pt reports no new concerns.   Will bring medication list        HPI     Caitlin Smith is a 66 y.o. female who presents as a returning patient (last seen 06/02/2021) to [facility] for follow up of inverse psoriasis. At her last appt, she was advised to switch to calcipotriene and limit use of TAC. She says she did not ever get the calcipotriene. She uses the TAC about every other day for itching and irritation under the breast and the pannus. This is helpful.    She has linear white streaks on her R shin. These developed after a ball game where she had a lot of sun. The hypopigmentation has seemed to face. She uses Vaseline Cocoa Butter.    The patient denies any other new or changing lesions or areas of concern.     Pertinent Past Medical History     Inverse psoriasis  SLE   Eosinophilic pneumonitis   She was initially??diagnosed in 2006 with SLE presenting with episcleritis and arthralgia with serology showing +ANA, +dsDNA. ??She also has stage I CKD felt secondary not due to SLE but due to HTN and obesity.     Past Medical History, Family History, Social History, Medication List, Allergies, and Problem List were reviewed in the rooming section of Epic.     ROS: Other than symptoms mentioned in the HPI, no fevers, chills, or other skin complaints    Physical Examination     GENERAL: Well-appearing female in no acute distress, resting comfortably.  NEURO: Alert and oriented, answers questions appropriately  PSYCH: Normal mood and affect  RESP: No increased work of breathing  SKIN (Full Skin Exam): Examination of the face, eyelids, lips, nose, ears, neck, chest, abdomen, back, arms, legs, hands, feet, palms, soles, nails was performed  - Dermatofibroma(s): Firm dermal nodule(s) with a hyperpigmented halo and a positive dimple sign located on the LUE  - hypopigmented linear patches on the RLE   - inframammary area clear  - pannus slightly moist    All areas not commented on are within normal limits or unremarkable  - female chaperone present    (Approved Template 05/18/2020)

## 2021-09-29 NOTE — Unmapped (Addendum)
It was nice to see you today! Your resident physician was Dr. Eustace Quail    Desitin cream (zinc oxide, diaper paste) daily in skin folds (under breasts, under belly).  Use triamcinolone only when red and itchy. Try to limit use as this can cause skin thinning and lightening.    If any of your medications are too expensive, look for a coupon at [email or web address] or reference the application on a smartphone  - Enter the medication name, size, and your zip code to find coupons for local pharmacies.   - Print a coupon and bring it to the pharmacy, or pull up the coupon on a smartphone.  - You can also call pharmacies to ask about the cost of your medication before you pick it up.  If you still cannot afford your medication, please let us know.     Please call our clinic at [phone] with any concerns or to schedule a follow up appointment. We look forward to seeing you again!

## 2021-09-30 ENCOUNTER — Ambulatory Visit: Admit: 2021-09-30 | Discharge: 2021-10-01 | Payer: MEDICARE

## 2021-09-30 DIAGNOSIS — M329 Systemic lupus erythematosus, unspecified: Principal | ICD-10-CM

## 2021-09-30 DIAGNOSIS — M3219 Other organ or system involvement in systemic lupus erythematosus: Principal | ICD-10-CM

## 2021-09-30 DIAGNOSIS — M7050 Other bursitis of knee, unspecified knee: Principal | ICD-10-CM

## 2021-09-30 DIAGNOSIS — Z79899 Other long term (current) drug therapy: Principal | ICD-10-CM

## 2021-09-30 LAB — CBC W/ AUTO DIFF
BASOPHILS ABSOLUTE COUNT: 0 10*9/L (ref 0.0–0.1)
BASOPHILS RELATIVE PERCENT: 1.1 %
EOSINOPHILS ABSOLUTE COUNT: 0.2 10*9/L (ref 0.0–0.5)
EOSINOPHILS RELATIVE PERCENT: 5.1 %
HEMATOCRIT: 41.6 % (ref 34.0–44.0)
HEMOGLOBIN: 13.6 g/dL (ref 11.3–14.9)
LYMPHOCYTES ABSOLUTE COUNT: 1.9 10*9/L (ref 1.1–3.6)
LYMPHOCYTES RELATIVE PERCENT: 43.1 %
MEAN CORPUSCULAR HEMOGLOBIN CONC: 32.7 g/dL (ref 32.0–36.0)
MEAN CORPUSCULAR HEMOGLOBIN: 27.6 pg (ref 25.9–32.4)
MEAN CORPUSCULAR VOLUME: 84.3 fL (ref 77.6–95.7)
MEAN PLATELET VOLUME: 7.8 fL (ref 6.8–10.7)
MONOCYTES ABSOLUTE COUNT: 0.4 10*9/L (ref 0.3–0.8)
MONOCYTES RELATIVE PERCENT: 9.8 %
NEUTROPHILS ABSOLUTE COUNT: 1.8 10*9/L (ref 1.8–7.8)
NEUTROPHILS RELATIVE PERCENT: 40.9 %
NUCLEATED RED BLOOD CELLS: 0 /100{WBCs} (ref <=4)
PLATELET COUNT: 343 10*9/L (ref 150–450)
RED BLOOD CELL COUNT: 4.94 10*12/L (ref 3.95–5.13)
RED CELL DISTRIBUTION WIDTH: 14.8 % (ref 12.2–15.2)
WBC ADJUSTED: 4.4 10*9/L (ref 3.6–11.2)

## 2021-09-30 LAB — C3 COMPLEMENT: C3 COMPLEMENT: 122 mg/dL (ref 90–170)

## 2021-09-30 LAB — URINALYSIS WITH MICROSCOPY WITH CULTURE REFLEX
BACTERIA: NONE SEEN /HPF
BILIRUBIN UA: NEGATIVE
BLOOD UA: NEGATIVE
GLUCOSE UA: NEGATIVE
KETONES UA: NEGATIVE
LEUKOCYTE ESTERASE UA: NEGATIVE
NITRITE UA: NEGATIVE
PH UA: 7.5 (ref 5.0–9.0)
PROTEIN UA: 50 — AB
RBC UA: <1 /HPF (ref <=4)
SPECIFIC GRAVITY UA: 1.028 (ref 1.003–1.030)
SQUAMOUS EPITHELIAL: <1 /HPF (ref 0–5)
UROBILINOGEN UA: 6 — AB
WBC UA: <1 /HPF (ref 0–5)

## 2021-09-30 LAB — C4 COMPLEMENT: C4 COMPLEMENT: 23.2 mg/dL (ref 12.0–36.0)

## 2021-09-30 LAB — CREATININE
CREATININE: 0.9 mg/dL — ABNORMAL HIGH
EGFR CKD-EPI (2021) FEMALE: 72 mL/min/{1.73_m2} (ref >=60)

## 2021-09-30 LAB — AST: AST (SGOT): 21 U/L (ref <=34)

## 2021-09-30 LAB — PROTEIN / CREATININE RATIO, URINE
CREATININE, URINE: 201.5 mg/dL
PROTEIN URINE: 27.8 mg/dL
PROTEIN/CREAT RATIO, URINE: 0.138

## 2021-09-30 LAB — ALT: ALT (SGPT): 12 U/L (ref 10–49)

## 2021-09-30 MED ORDER — MYCOPHENOLATE SODIUM 360 MG TABLET,DELAYED RELEASE
ORAL_TABLET | Freq: Two times a day (BID) | ORAL | 3 refills | 90.00000 days | Status: CP
Start: 2021-09-30 — End: ?

## 2021-09-30 NOTE — Progress Notes (Signed)
Name: Nicole Ballard   MRN: [id number]    DOB: October 17, 1955   Date:10/01/2021       Progress Note  Subjective  Chief Complaint  Cough  HPI  Cough: symptoms started end of Dec 2022. She went to urgent care and was negative for flu and COVID-19, she states symptoms persisted and she had a virtual visit on 09/09/2021 and was given doxycycline , after that she was seen by her pulmonologist on 01/11 and was advised to finish antibiotics and collect a sputum culture ( I cannot see the results) , he also gave her tessalon perles . She is feeling slightly better. Still coughing a lot however not as much sputum production, she has stable SOB, no longer using ventolin on a regular basis, using Flovent twice daily as prescribed. She has interstitial lung disease and is going back to see him in 3 months . Dr. Bing Matter recommended Rheumatologist to increase dose of mycophenolate and she just got a new rx for 4 tablets daily, she is worried about going up on the dose    Patient Active Problem List   Diagnosis Date Noted   Immunocompromised ([place]) 04/29/2021   Hyperglycemia 10/23/2017   Vertigo 09/29/2016   Bronchiolitis obliterans organizing pneumonia ([place]) 09/29/2016   Neck pain, musculoskeletal 12/15/2015   Abnormal CAT scan 04/11/2015   Auditory impairment 04/11/2015   Insomnia, persistent 04/11/2015   Chronic kidney disease (CKD), stage I 04/11/2015   Chronic nonmalignant pain 04/11/2015   Chronic constipation 04/11/2015   Dyslipidemia 04/11/2015   Fibromyalgia 04/11/2015   Gastro-esophageal reflux disease without esophagitis 04/11/2015   Dysphonia 04/11/2015   Low back pain 04/11/2015   Eczema intertrigo 04/11/2015   Keratosis pilaris 04/11/2015   Chronic recurrent major depressive disorder ([place]) 52/77/8242   Dysmetabolic syndrome 35/36/1443   Neurogenic claudication ([place]) 04/11/2015   Perennial allergic rhinitis with seasonal variation 04/11/2015   Abnormal presence of protein in urine  04/11/2015   Seborrhea capitis 04/11/2015   Moderate dysplasia of vulva 04/11/2015   Vitamin D deficiency 04/11/2015   Spinal stenosis of lumbar region 04/11/2015   Treadmill stress test negative for angina pectoris 03/05/2015   Shortness of breath 05/20/2014   Asthma, chronic 12/31/2013   H/O total knee replacement 12/31/2013   Episcleritis 09/26/2013   Vocal cord dysfunction 05/28/2013   Anxiety, generalized 03/19/2013   Back pain 12/18/2012   Pulmonary nodules 11/26/2012   Lung nodule, multiple 11/26/2012   Arthritis, degenerative 11/01/2012   H/O aspiration pneumonitis 10/22/2012   Postinflammatory pulmonary fibrosis ([place]) 10/22/2012   Mixed incontinence 05/04/2012   Lung involvement in systemic lupus erythematosus ([place]) 11/05/2010   Chronic nausea 07/01/2008   Benign hypertension 01/25/2005    Past Surgical History:  Procedure Laterality Date   BRAIN SURGERY     CHOLECYSTECTOMY     VAGINAL HYSTERECTOMY      Family History  Problem Relation Age of Onset   Breast cancer Mother    Cancer Mother 32       Breast   Cancer Father 76       Stomach   Breast cancer Maternal Aunt     Social History   Tobacco Use   Smoking status: Never   Smokeless tobacco: Never  Substance Use Topics   Alcohol use: No    Alcohol/week: 0.0 standard drinks     Current Outpatient Medications:    albuterol (VENTOLIN HFA) 108 (90 Base) MCG/ACT inhaler, SMARTSIG:2 Puff(s) By Mouth Every 6 Hours PRN, Disp: , Rfl:  amLODipine (NORVASC) 2.5 MG tablet, TAKE 3 TABLETS BY MOUTH  DAILY, Disp: 270 tablet, Rfl: 1   aspirin EC 81 MG tablet, Take 1 tablet (81 mg total) by mouth daily., Disp: 30 tablet, Rfl: 0   atenolol (TENORMIN) 25 MG tablet, Take 1 tablet (25 mg total) by mouth daily., Disp: 90 tablet, Rfl: 3   atorvastatin (LIPITOR) 40 MG tablet, TAKE 1 TABLET BY MOUTH  DAILY, Disp: 90 tablet, Rfl: 2   Biotin 1 MG CAPS, Take 1 capsule by mouth daily., Disp: 30 capsule, Rfl: 0   buPROPion  (WELLBUTRIN XL) 150 MG 24 hr tablet, Take 150 mg by mouth every morning., Disp: , Rfl:    Cholecalciferol (VITAMIN D) 2000 UNITS CAPS, Take 1 capsule by mouth daily., Disp: , Rfl:    clobetasol ointment (TEMOVATE) 5.78 %, Apply 1 application topically 2 (two) times daily., Disp: , Rfl:    clonazePAM (KLONOPIN) 0.5 MG tablet, Take by mouth., Disp: , Rfl:    clotrimazole-betamethasone (LOTRISONE) cream, Apply 1 application topically 2 (two) times daily., Disp: 45 g, Rfl: 0   cyclobenzaprine (FLEXERIL) 5 MG tablet, Take 1-2 tablets (5-10 mg total) by mouth 3 (three) times daily as needed for muscle spasms., Disp: 20 tablet, Rfl: 0   diclofenac (VOLTAREN) 75 MG EC tablet, Take 1 tablet (75 mg total) by mouth 2 (two) times daily., Disp: 20 tablet, Rfl: 0   diclofenac Sodium (VOLTAREN) 1 % GEL, Apply topically daily as needed., Disp: , Rfl:    doxycycline (VIBRA-TABS) 100 MG tablet, Take 1 tablet (100 mg total) by mouth 2 (two) times daily., Disp: 14 tablet, Rfl: 0   fluticasone (FLONASE) 50 MCG/ACT nasal spray, Place 2 sprays into both nostrils daily., Disp: 48 g, Rfl: 3   fluticasone (FLOVENT HFA) 220 MCG/ACT inhaler, Inhale into the lungs., Disp: , Rfl:    gabapentin (NEURONTIN) 300 MG capsule, Take 300 mg (1 capsule) in the morning and at 300 mg (1 capsule) at noon and 600 mg (2 capsules) at night., Disp: , Rfl:    hydrALAZINE (APRESOLINE) 10 MG tablet, TAKE 1 TABLET BY MOUTH THREE TIMES DAILY AS NEEDED FOR BLOOD PRESSURE ABOVE 150/90, Disp: 90 tablet, Rfl: 2   hydroxychloroquine (PLAQUENIL) 200 MG tablet, Take by mouth 2 (two) times daily., Disp: , Rfl:    hydrOXYzine (ATARAX) 10 MG tablet, Take 1 tablet (10 mg total) by mouth 3 (three) times daily as needed., Disp: 30 tablet, Rfl: 0   lamoTRIgine (LAMICTAL) 200 MG tablet, Take 200 mg by mouth 2 (two) times daily., Disp: , Rfl:    LINZESS 145 MCG CAPS capsule, Take 145 mcg by mouth daily., Disp: , Rfl:    loratadine (CLARITIN) 10 MG tablet, Take 1  tablet (10 mg total) by mouth daily., Disp: 90 tablet, Rfl: 1   Melatonin 10 MG TABS, Take 1 tablet by mouth at bedtime., Disp: , Rfl:    mycophenolate (MYFORTIC) 360 MG TBEC EC tablet, Take 360 mg by mouth 2 (two) times daily., Disp: , Rfl:    olopatadine (PATANOL) 0.1 % ophthalmic solution, Place 1 drop into both eyes 2 (two) times daily., Disp: 5 mL, Rfl: 1   omeprazole (PRILOSEC) 40 MG capsule, TAKE 1 CAPSULE BY MOUTH  DAILY, Disp: 90 capsule, Rfl: 3   Polyethylene Glycol 3350 (MIRALAX PO), Take by mouth., Disp: , Rfl:    pregabalin (LYRICA) 50 MG capsule, , Disp: , Rfl:    telmisartan (MICARDIS) 80 MG tablet, TAKE 1 TABLET BY MOUTH  DAILY, Disp: 90 tablet, Rfl: 1   traZODone (DESYREL) 100 MG tablet, Take 150 mg by mouth at bedtime., Disp: , Rfl:    triamcinolone ointment (KENALOG) 0.1 %, , Disp: , Rfl:    venlafaxine (EFFEXOR) 100 MG tablet, Take 100 mg by mouth daily., Disp: , Rfl:    venlafaxine XR (EFFEXOR-XR) 37.5 MG 24 hr capsule, Take 37.5 mg by mouth 2 (two) times daily., Disp: , Rfl:    benzonatate (TESSALON) 100 MG capsule, Take by mouth., Disp: , Rfl:    famotidine (PEPCID) 20 MG tablet, Take by mouth., Disp: , Rfl:   Allergies  Allergen Reactions   Ace Inhibitors     Other reaction(s): OTHER Pt states she can not take ace inhibitors. Pt states she can not remember her reaction.    Bee Pollen    Penicillins    Pollen Extract     I personally reviewed active problem list, medication list, allergies, family history, social history, health maintenance with the patient/caregiver today.   ROS  Ten systems reviewed and is negative except as mentioned in HPI   Objective  Vitals:   10/01/21 0947  BP: 120/84  Pulse: 84  Resp: 16  Temp: 98.1 F (36.7 C)  SpO2: 96%  Weight: 239 lb (108.4 kg)  Height: 5\' 7"  (1.702 m)    Body mass index is 37.43 kg/m.  Physical Exam  Constitutional: Patient appears well-developed and well-nourished. Obese  No distress.  HEENT:  head atraumatic, normocephalic, neck supple Cardiovascular: Normal rate, regular rhythm and normal heart sounds.  2/6 sem  murmur heard. No BLE edema. Pulmonary/Chest: Effort normal and breath sounds normal. No respiratory distress. Abdominal: Soft.  There is no tenderness. Psychiatric: Patient has a normal mood and affect. behavior is normal. Judgment and thought content normal.   PHQ2/9: Depression screen [facility] 2/9 10/01/2021 09/07/2021 09/01/2021 08/19/2021 07/14/2021  Decreased Interest 0 0 0 2 0  Down, Depressed, Hopeless 0 0 0 2 0  PHQ - 2 Score 0 0 0 4 0  Altered sleeping 0 - - 3 -  Tired, decreased energy 0 - - 3 -  Change in appetite 0 - - 3 -  Feeling bad or failure about yourself  0 - - 2 -  Trouble concentrating 0 - - 1 -  Moving slowly or fidgety/restless 0 - - 2 -  Suicidal thoughts 0 - - 0 -  PHQ-9 Score 0 - - 18 -  Difficult doing work/chores - - - Not difficult at all -  Some recent data might be hidden    phq 9 is negative   Fall Risk: Fall Risk  10/01/2021 09/07/2021 09/01/2021 08/19/2021 07/14/2021  Falls in the past year? 0 0 0 1 1  Number falls in past yr: 0 0 0 1 1  Comment - - - - -  Injury with Fall? 0 0 0 1 1  Risk for fall due to : No Fall Risks No Fall Risks - History of fall(s) No Fall Risks  Risk for fall due to: Comment - - - - -  Follow up Falls prevention discussed Falls prevention discussed Falls evaluation completed Falls prevention discussed Falls prevention discussed      Functional Status Survey: Is the patient deaf or have difficulty hearing?: No Does the patient have difficulty seeing, even when wearing glasses/contacts?: No Does the patient have difficulty concentrating, remembering, or making decisions?: No Does the patient have difficulty walking or climbing stairs?: No Does the patient have  difficulty dressing or bathing?: No Does the patient have difficulty doing errands alone such as visiting a doctor's office or shopping?:  No    Assessment & Plan  1. Lung involvement in systemic lupus erythematosus ([place])   2. Interstitial lung disease ([place])  Advised to take medication as prescribed by pulmonologist and Rheumatologist   3. Cough in adult  - chlorpheniramine-HYDROcodone (TUSSIONEX PENNKINETIC ER) 10-8 MG/5ML; Take 5 mLs by mouth every 12 (twelve) hours as needed.  Dispense: 140 mL; Refill: 0

## 2021-09-30 NOTE — Unmapped (Addendum)
Will discuss with Pulmonary about increasing Myfortic. Increased Myfortic to 720 mg (Two tablets) twice a day.     Will give you the pneumococcal 20 vaccine.     Will have you return in several weeks to inject your pes anserine bursitis and hopefully will have COVId-19 bivalent vaccine and give this to you then    Labs and urine today

## 2021-09-30 NOTE — Unmapped (Signed)
Patient Name: Caitlin Smith  PCP: ??Ruel Favors, MD  Source of History: patient and records  Date of Visit: 09/30/21 3:37 PM    Chief Compliant: follow-up for SLE and FMS    PRIOR RHEUMATOLOGIC HISTORY:??SLE, OA, and fibromyalgia.  Initially diagnosed in 2006 with SLE presenting with episcleritis and arthralgia with serology showing +ANA, +dsDNA.  She also has stage I CKD felt secondary not due to SLE but due to HTN and obesity. In 04/2016 she was dx with eosinophilic pneumonitis, later evaluation most consistent with organizing pneumonia, following with pulmonology.  Treatment history:  - HCQ  - Previously treated with prednisone for eosinophilic pneumonitis with difficulty tapering due to pulm symptoms, so cellcept was added 2018.   - Cellcept changed to myfortic 2019 due to GI distress.   - tapered off prednisone in late 2019  - Derm eval in 07/2018 consistent with inverse psoriasis, possibly due to prednisone use. Recommended avoiding systemic steroids when possible.   - Current med regimen: Myfortic 360 mg twice daily, Plaquenil 200 mg twice daily.  Also followed by pain clinic for pharmacotherapy for fibromyalgia.    HPI: Caitlin Smith is a 66 y.o. female who presents for her follow-up for SLE complicated by eosinophilic pneumonitis vs organizing pneumonia and Inverse psoriasis managed topically as well as underlying fibromyalgia. She was last evaluated by me in March 2022 and seen more recently in August 2022 by Carlus Pavlov PAC. She saw dermatology yesterday and psoriasis is stable. She presents to clinic today in person.    She reports having a URI since the beginning of the year. Has continued to have a cough. She saw pulmonary earlier in January 2023 and tessalon perles were ordered to help. Pulmonary completed a sputum culture with no specific organism and negative AFB. DLCO was worse and pulmonary planning to discuss at ILD conference and determine next steps. Prior to this she had received antibiotic from her PCP. She reports compliance with Myfortic 360 mg po bid and hydroxychloroquine 200 mg po bid. Her last eye exam was in March 2022 and will be due this spring. She is hoping to get more trigger point injections with Dr. Fayrene Fearing in Pain Management. Given her worsening PFT, discussed increasing Myfortic to 720 mg po bid. She is amenable to this plan of care.     ROS??:  Attests to the above, otherwise, review of all other systems is negative.   ??  Past Medical and Surgical History:  ??  Patient Active Problem List    Diagnosis Date Noted   ??? Immunocompromised (CMS-HCC) 04/29/2021   ??? Otalgia 12/03/2019   ??? Hyperglycemia 10/23/2017   ??? Bronchiolitis obliterans organizing pneumonia (CMS-HCC) 09/29/2016   ??? Vertigo 09/29/2016   ??? Neck pain 12/15/2015   ??? Fibromyalgia 10/12/2015   ??? Chronic constipation 04/11/2015   ??? Chronic recurrent major depressive disorder (CMS-HCC) 04/11/2015   ??? Dyslipidemia 04/11/2015   ??? Obesity, diabetes, and hypertension syndrome (CMS-HCC) 04/11/2015   ??? Intertrigo 04/11/2015   ??? Fibrositis 04/11/2015   ??? Gastroesophageal reflux disease without esophagitis 04/11/2015   ??? Vitamin D deficiency 04/11/2015   ??? Seborrhea capitis 04/11/2015   ??? Perennial allergic rhinitis with seasonal variation 04/11/2015   ??? Moderate dysplasia of vulva 04/11/2015   ??? Spinal stenosis of lumbar region 04/11/2015   ??? Keratosis pilaris 04/11/2015   ??? Insomnia, persistent 04/11/2015   ??? Auditory impairment 04/11/2015   ??? Eczema intertrigo 04/11/2015   ??? Neurogenic claudication (CMS-HCC)  04/11/2015   ??? Screening for cardiovascular condition 03/05/2015   ??? Encounter for screening for cardiovascular disorders 03/05/2015   ??? Treadmill stress test negative for angina pectoris 03/05/2015   ??? Cough 05/20/2014   ??? Shortness of breath 05/20/2014   ??? Regurgitation 04/24/2014   ??? Epigastric burning sensation 04/24/2014   ??? Obesity with body mass index 30 or greater 02/03/2014   ??? S/P total knee replacement 12/31/2013   ??? Asthma 12/31/2013   ??? Asthma, chronic 12/31/2013   ??? H/O total knee replacement 12/31/2013   ??? SLE (systemic lupus erythematosus) (CMS-HCC) 11/28/2013   ??? Episcleritis 09/26/2013   ??? Other diseases of vocal cords 05/28/2013   ??? Vocal cord dysfunction 05/28/2013   ??? Generalized anxiety disorder 03/19/2013   ??? Pain medication agreement signed 02/12/2013   ??? Family history of breast cancer    ??? 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 Back pain 12/18/2012   ??? Multiple pulmonary nodules 11/26/2012   ??? Lung nodule, multiple 11/26/2012   ??? Pulmonary nodules 11/26/2012   ??? Osteoarthrosis 11/01/2012   ??? Osteoarthritis 11/01/2012   ??? Arthritis, degenerative 11/01/2012   ??? Postinflammatory pulmonary fibrosis (CMS-HCC) 10/22/2012   ??? Aspiration pneumonitis (CMS-HCC) 10/22/2012   ??? H/O aspiration pneumonitis 10/22/2012   ??? Mixed urge and stress incontinence 05/04/2012   ??? Dysphonia 03/22/2012   ??? Sensorineural hearing loss 03/22/2012   ??? Myalgia and myositis 02/25/2011   ??? Benign neoplasm of pituitary gland and craniopharyngeal duct (pouch) (CMS-HCC) 12/29/2010   ??? Chronic kidney disease, stage I 12/08/2010   ??? Proteinuria 12/08/2010   ??? Lung involvement in systemic lupus erythematosus (CMS-HCC) 11/05/2010   ??? Chronic nausea 07/01/2008   ??? Hypertension, benign 01/25/2005     Past Surgical History:   Procedure Laterality Date   ??? BRAIN SURGERY      for facial spasms   ??? BREAST BIOPSY Right 2012    Needle bx   ??? GALLBLADDER SURGERY     ??? HYSTERECTOMY N/A 07/09/2001    Vaginal Hysterectomy with ovaries in place   ??? JOINT REPLACEMENT Right     3 TO 4 YEARS AGO   ??? PR ANAL PRESSURE RECORD N/A 03/22/2016    Procedure: ANORECTAL MANOMETRY;  Surgeon: Nurse-Based Giproc;  [facility];  Service: Gastroenterology   ??? PR BRONCHOSCOPY,DIAGNOSTIC W LAVAGE Bilateral 04/25/2016    Procedure: BRONCHOSCOPY, RIGID OR FLEXIBLE, INCLUDE FLUOROSCOPIC GUIDANCE WHEN PERFORMED; W/BRONCHIAL ALVEOLAR LAVAGE WITH MODERATE SEDATION;  Surgeon: Mercy Moore, MD;  Location: BRONCH PROCEDURE LAB [facility];  Service: Pulmonary   ??? PR BRONCHOSCOPY,TRANSBRONCH BIOPSY N/A 04/25/2016    Procedure: BRONCHOSCOPY, RIGID/FLEXIBLE, INCLUDE FLUORO GUIDANCE WHEN PERFORMED; W/TRANSBRONCHIAL LUNG BX, SINGLE LOBE WITH MODERATE SEDATION;  Surgeon: Mercy Moore, MD;  Location: BRONCH PROCEDURE LAB [facility];  Service: Pulmonary   ??? PR COLON CA SCRN NOT HI RSK IND  08/22/2013    Procedure: COLOREC CNCR SCR;COLNSCPY NO;  Surgeon: Brown Human, MD;  [facility] [facility];  Service: Gastroenterology   ??? PR GERD TST W/ MUCOS IMPEDE ELECTROD,>1HR N/A 05/26/2014    Procedure: ESOPHAGEAL FUNCTION TEST, GASTROESOPHAGEAL REFLUX TEST W/ NASAL CATHETER INTRALUMINAL IMPEDANCE ELECTRODE(S) PLACEMENT, RECORDING, ANALYSIS AND INTERPRETATION; PROLONGED;  Surgeon: Nurse-Based Giproc;  [facility];  Service: Gastroenterology   ??? PR TOTAL KNEE ARTHROPLASTY Right 12/31/2013    Procedure: ARTHROPLASTY, KNEE, CONDYLE & PLATEAU; MEDIAL & LAT COMPARTMENT W/WO PATELLA RESURFACE (TOTAL KNEE ARTHROP);  Surgeon:  Aram Beecham, MD;  Location: MAIN OR [facility];  Service: Orthopedics   ??? PR UPPER GI ENDOSCOPY,BIOPSY N/A 11/07/2014    Procedure: UGI ENDOSCOPY; WITH BIOPSY, SINGLE OR MULTIPLE;  Surgeon: Trula Slade, MD;  Location: [facility] [place] [facility];  Service: Gastroenterology   ??? SKIN BIOPSY     ??? wide local excision of vulva Right      Allergies:   ??  Allergies   Allergen Reactions   ??? Vilazodone Swelling   ??? Ace Inhibitors Other (See Comments)     Pt states she can not take ace inhibitors. Pt states she can not remember her reaction.    ??? Bee Pollen Itching     COUGH, WATERY EYES   ??? Penicillin G Rash   ??? Penicillins Rash   ??? Pollen Extracts Itching     COUGHING, WATERY EYES     Current Outpatient Medications:  ??  Current Outpatient Medications on File Prior to Visit   Medication Sig Dispense Refill   ??? albuterol HFA 90 mcg/actuation inhaler Inhale 2 puffs every six (6) hours as needed for wheezing. 8 g 11   ??? amLODIPine (NORVASC) 2.5 MG tablet Take 3 tablets by mouth daily.     ??? aspirin (ECOTRIN) 81 MG tablet Take 81 mg by mouth daily.     ??? atenoloL (TENORMIN) 25 MG tablet Take 25 mg by mouth daily.     ??? atorvastatin (LIPITOR) 40 MG tablet Take 40 mg by mouth daily.     ??? benzonatate (TESSALON PERLES) 100 MG capsule Take 1 capsule (100 mg total) by mouth every six (6) hours as needed for cough. 30 capsule 1   ??? biotin 1 mg cap Take by mouth.     ??? brompheniramine-pseudoephedrine-DM 2-30-10 mg/5 mL syrup TAKE 5 ML BY MOUTH EVERY 6 HOURS AS NEEDED     ??? buPROPion (WELLBUTRIN XL) 150 MG 24 hr tablet Take 1 tablet (150 mg total) by mouth every morning. 90 tablet 1   ??? calcipotriene (DOVONOX) 0.005 % ointment Apply topically daily. Use in skin folds and on the lower leg. 120 g 2   ??? clobetasoL (TEMOVATE) 0.05 % ointment Apply topically Two (2) times a week. 30 g 3   ??? clonazePAM (KLONOPIN) 0.5 MG tablet Take 0.5 tablets (0.25 mg total) by mouth nightly. 15 tablet 2   ??? clotrimazole-betamethasone (LOTRISONE) 1-0.05 % cream Apply 1 application topically Two (2) times a day.     ??? cyclobenzaprine (FLEXERIL) 5 MG tablet Take 1 tablet (5 mg total) by mouth two (2) times a day as needed. 60 tablet 2   ??? diclofenac sodium (VOLTAREN) 1 % gel Apply 2 g topically two (2) times a day as needed for arthritis. 300 g 5   ??? dicyclomine (BENTYL) 20 mg tablet Take 20 mg by mouth every six (6) hours.     ??? fluticasone propionate (FLONASE) 50 mcg/actuation nasal spray      ??? fluticasone propionate (FLOVENT HFA) 220 mcg/actuation inhaler Inhale 1 puff Two (2) times a day. 12 g 11   ??? hydrALAZINE (APRESOLINE) 10 MG tablet      ??? hydrOXYchloroQUINE (PLAQUENIL) 200 mg tablet Take 1 tablet (200 mg total) by mouth Two (2) times a day. 180 tablet 3   ??? hydrOXYzine (ATARAX) 10 MG tablet      ??? inhalational spacing device (AEROCHAMBER MV) Spcr 1 each by Miscellaneous route two (2) times a day. With Fovent 1 each 0   ???  ketoconazole (NIZORAL) 2 % cream APP EXT AA D  0   ??? linaCLOtide (LINZESS) 145 mcg capsule Take 1 capsule (145 mcg total) by mouth daily. 30 capsule 3   ??? losartan (COZAAR) 50 MG tablet Take 50 mg by mouth in the morning.     ??? meclizine (ANTIVERT) 25 mg tablet Take 25 mg by mouth Three (3) times a day as needed.      ??? melatonin 10 mg Tab Take 1 tablet by mouth.     ??? mupirocin (BACTROBAN) 2 % ointment APP EXT IEN BID  0   ??? mycophenolate (MYFORTIC) 360 MG TbEC Take 1 tablet by mouth twice daily 180 tablet 3   ??? omeprazole (PRILOSEC) 40 MG capsule Take 1 capsule (40 mg total) by mouth daily. 90 capsule 3   ??? ondansetron (ZOFRAN) 4 MG tablet      ??? telmisartan (MICARDIS) 80 MG tablet Take 80 mg by mouth.     ??? triamcinolone (KENALOG) 0.1 % ointment Apply twice a day to affected areas when red and itchy. Stop when improved and use a barrier cream. 80 g 1   ??? venlafaxine (EFFEXOR) 100 MG tablet Take 1 tablet (100 mg total) by mouth in the morning. 30 tablet 11   ??? [EXPIRED] bisacodyL (DULCOLAX) 5 mg EC tablet Take 2 tablets (10 mg total) by mouth once for 1 dose. Take 2 tablets (10mg  total) for one dose as directed. 2 tablet 0   ??? doxycycline (VIBRA-TABS) 100 MG tablet  (Patient not taking: Reported on 09/30/2021)     ??? famotidine (PEPCID) 20 MG tablet Take 1 tablet (20 mg total) by mouth Two (2) times a day. 60 tablet 11   ??? gabapentin (NEURONTIN) 300 MG capsule Take 2 capsules (600 mg total) by mouth Three (3) times a day. 360 capsule 2   ??? lamoTRIgine (LAMICTAL) 200 MG tablet Take 1 tablet (200 mg total) by mouth Two (2) times a day. 180 tablet 1   ??? polyethylene glycol (CLEARLAX) 17 gram/dose powder Take as directed for split prep. (Patient not taking: Reported on 09/30/2021) 238 g 0   ??? polyethylene glycol (GLYCOLAX) 17 gram/dose powder 2 cap fulls in a full glass of water, three times a day, for 5 days. (Patient not taking: Reported on 09/30/2021)     ??? [EXPIRED] polyethylene glycol (GOLYTELY) 236-22.74-6.74 gram solution Take as directed per [facility] prep instructions, for split bowel prep. 4000 mL 0   ??? pregabalin (LYRICA) 50 MG capsule  (Patient not taking: Reported on 09/30/2021)     ??? traZODone (DESYREL) 100 MG tablet Take 1.5 tablets (150 mg total) by mouth nightly for 90 doses. 135 tablet 1     No current facility-administered medications on file prior to visit.     Immunization History   Administered Date(s) Administered   ??? COVID-19 VACCINE,MRNA(MODERNA)(PF) 11/20/2019, 12/23/2019, 08/25/2020, 12/31/2020, 04/29/2021   ??? Hepatitis A 08/12/2015, 02/10/2016   ??? Hepatitis B, Adult 06/26/2007, 08/12/2015, 10/13/2015, 02/10/2016   ??? INFLUENZA TIV (TRI) 45MO+ W/ PRESERV (IM) 06/01/2010, 05/14/2013, 04/19/2014   ??? INFLUENZA TIV (TRI) PF (IM) 06/26/2007, 06/02/2011, 06/02/2011, 12/01/2011, 06/09/2013   ??? Influenza Vaccine Quad (IIV4 PF) 60mo+ injectable 04/16/2015, 05/26/2016, 05/17/2017, 05/14/2018, 06/12/2019, 06/15/2021   ??? Influenza Virus Vaccine, unspecified formulation 06/05/2020   ??? MMR 06/26/2007   ??? Measles / Rubella 10/05/1987   ??? PNEUMOCOCCAL POLYSACCHARIDE 23 08/04/2016   ??? Pneumococcal Conjugate 13-Valent 08/06/2015, 05/26/2016   ??? Pneumococcal Conjugate 20-valent 09/30/2021   ???  TdaP 06/26/2007, 07/14/2021     ????  PHYSICAL EXAM??  Vital signs: BP 118/69 (BP Site: L Arm, BP Position: Sitting, BP Cuff Size: Medium)  - Pulse 69  - Temp 36.3 ??C (97.3 ??F) (Temporal)  - Wt (!) 108.6 kg (239 lb 6.4 oz)  - BMI 37.49 kg/m?? Body mass index is 37.49 kg/m??.  Gen: Well-developed, well-nourished adult in no apparent distress. Normocephalic with no external signs of trauma. Pleasant and cooperative. AOx4.?  HEENT: PERRLA, EOMI, MMM, oropharynx in pink without ulcerations or thrush.   Lungs: Broad chest excursion with good air movement. CTAB without wheezing/rhonchi/rales. ????  CV: RRR, Normal S1/S2, No murmurs/rubs/gallops heard. ??  PV: Warm, 2+ radial and pedal pulses, no C/C/E.????  Neuro: Good comprehension/cognition. CN 2-12 intact.  Muscle strength 5/5 in all extremities. Gait normal.????  Comprehensive Musculoskeletal Examination:????  ?? Jaw, neck without limited ROM.????  ?? Shoulders, elbows, wrists, hands, fingers:  No deformity, erythema, warmth, swelling, effusion, tenderness, limited ROM.?? FROM of both shoulders but notes pain with ROM and with palpation  ?? Lumbosacral spine and hips without limited ROM.????  ?? Knee, ankles, feet, toes: No deformity, erythema, warmth, swelling, effusion, tenderness, limited ROM. Knee stable to valgus/varus stress and anterior/posterior drawer sign.???? Tender at bilateral pes anserine bursa  Skin: No alopecia, nail change (including no nail pitting), rashes, bruising, petechiae, telangiectasias, tophi, appreciable calcinosis, or nodules.??     LABORATORY - monitoring labs today for disease activity and medication monitoring. No signs for medication toxicity.  Recent Results (from the past 168 hour(s))   Creatinine    Collection Time: 09/30/21  4:14 PM   Result Value Ref Range    Creatinine 0.90 (H) 0.60 - 0.80 mg/dL    eGFR CKD-EPI (1610) Female 74 >=60 mL/min/1.55m2   AST    Collection Time: 09/30/21  4:14 PM   Result Value Ref Range    AST 21 <=34 U/L   ALT    Collection Time: 09/30/21  4:14 PM   Result Value Ref Range    ALT 12 10 - 49 U/L   CBC w/ Differential    Collection Time: 09/30/21  4:14 PM   Result Value Ref Range    WBC 4.4 3.6 - 11.2 10*9/L    RBC 4.94 3.95 - 5.13 10*12/L    HGB 13.6 11.3 - 14.9 g/dL    HCT 96.0 45.4 - 09.8 %    MCV 84.3 77.6 - 95.7 fL    MCH 27.6 25.9 - 32.4 pg    MCHC 32.7 32.0 - 36.0 g/dL    RDW 11.9 14.7 - 82.9 %    MPV 7.8 6.8 - 10.7 fL    Platelet 343 150 - 450 10*9/L    nRBC 0 <=4 /100 WBCs    Neutrophils % 40.9 %    Lymphocytes % 43.1 %    Monocytes % 9.8 %    Eosinophils % 5.1 %    Basophils % 1.1 %    Absolute Neutrophils 1.8 1.8 - 7.8 10*9/L    Absolute Lymphocytes 1.9 1.1 - 3.6 10*9/L    Absolute Monocytes 0.4 0.3 - 0.8 10*9/L    Absolute Eosinophils 0.2 0.0 - 0.5 10*9/L    Absolute Basophils 0.0 0.0 - 0.1 10*9/L   Protein/Creatinine Ratio, Urine    Collection Time: 09/30/21  5:15 PM   Result Value Ref Range    Creat U 201.5 Undefined mg/dL    Protein, Ur 56.2 Undefined mg/dL    Protein/Creatinine  Ratio, Urine 0.138 Undefined       ????GENERAL SUMMARY AND IMPRESSION: ????  ????  In summary, the patient is a 66 y.o. female with SLE complicated by eosinophilic pneumonitis vs organizing pneumonia and Inverse psoriasis managed topically as well as underlying fibromyalgia. Recently saw pulmonary with concerns for worsening PFT. Given this, we discussed increasing Myfortic from 360 mg po bid to 720 mg po bid. She is in agreement with this plan. Otherwise on exam today no signs for inflammatory arthritis. Recently seen by dermatology and inverse psoriasis managed with topical therapy. We will get labs today to monitor disease activit and potential medication toxicity which on return of results are not concerning. She does have pain at bilateral shoulders with ROM and palpation and at bilateral pes anserine bursitis. She is interested in therapeutic injections of pes anserine bursitis. Will arrange for her to return in three weeks when I have an opening to complete injections. Otherwise, follow-up in 4 months with Carlus Pavlov [facility] and 8 months with myself. Pneumococcal vaccine today.  Return in about 8 months (around 05/31/2022).    RECOMMENDATIONS: ????  ????   Diagnosis ICD-10-CM Associated Orders   1. Systemic lupus erythematosus, unspecified SLE type, unspecified organ involvement status (CMS-HCC)  M32.9 CBC w/ Differential     Creatinine     AST     ALT     C3 complement     C4 complement     Urinalysis with Microscopy with Culture Reflex     Protein/Creatinine Ratio, Urine     Anti-DNA antibody, double-stranded     Serum Protein Electrophoresis and Immunofixation     25 OH Vit D      2. On mycophenolate mofetil therapy  Z79.899 CBC w/ Differential     Creatinine     AST     ALT     C3 complement     C4 complement     Urinalysis with Microscopy with Culture Reflex     Protein/Creatinine Ratio, Urine     Anti-DNA antibody, double-stranded     Serum Protein Electrophoresis and Immunofixation     25 OH Vit D      3. Other systemic lupus erythematosus with other organ involvement (CMS-HCC)  M32.19 mycophenolate (MYFORTIC) 360 MG TbEC      4. Pes anserine bursitis  M70.50         Patient Instructions   Will discuss with Pulmonary about increasing Myfortic. Increased Myfortic to 720 mg (Two tablets) twice a day.     Will give you the pneumococcal 20 vaccine.     Will have you return in several weeks to inject your pes anserine bursitis and hopefully will have COVId-19 bivalent vaccine and give this to you then    Labs and urine today    The patient indicates understanding of these issues and agrees to the plan as outlined above.  Contact information provided for any concerns or questions in the interim.  ??  I personally spent 35 minutes face-to-face and non-face-to-face in the care of this patient, which includes all pre, intra, and post visit time on the date of service.  All documented time was specific to the E/M visit and does not include any procedures that may have been performed.      Tonja Jezewski C. Scarlette Calico, MD, PhD  Assistant Professor of Medicine  Department of Medicine/Division of Rheumatology  [facility]  6:22 PM

## 2021-10-01 ENCOUNTER — Ambulatory Visit (INDEPENDENT_AMBULATORY_CARE_PROVIDER_SITE_OTHER): Payer: Commercial Managed Care - HMO | Admitting: Family Medicine

## 2021-10-01 ENCOUNTER — Encounter: Payer: Self-pay | Admitting: Family Medicine

## 2021-10-01 VITALS — BP 120/84 | HR 84 | Temp 98.1°F | Resp 16 | Ht 67.0 in | Wt 239.0 lb

## 2021-10-01 DIAGNOSIS — M3213 Lung involvement in systemic lupus erythematosus: Secondary | ICD-10-CM

## 2021-10-01 DIAGNOSIS — J849 Interstitial pulmonary disease, unspecified: Secondary | ICD-10-CM | POA: Diagnosis not present

## 2021-10-01 DIAGNOSIS — R059 Cough, unspecified: Secondary | ICD-10-CM

## 2021-10-01 LAB — SERUM PROTEIN ELECTROPHORESIS AND IMMUNOFIXATION
ALBUMIN (SPE): 3.8 g/dL (ref 3.5–5.0)
ALPHA-1 GLOBULIN: 0.3 g/dL (ref 0.2–0.5)
ALPHA-2 GLOBULIN: 0.7 g/dL (ref 0.5–1.1)
BETA-1 GLOBULIN: 0.5 g/dL (ref 0.3–0.6)
BETA-2 GLOBULIN: 0.5 g/dL (ref 0.2–0.6)
GAMMAGLOBULIN: 1.9 g/dL — ABNORMAL HIGH (ref 0.5–1.5)
PROTEIN TOTAL: 7.7 g/dL

## 2021-10-01 MED ORDER — HYDROCOD POLI-CHLORPHE POLI ER 10-8 MG/5ML PO SUER: 5.0000 mL | Freq: Two times a day (BID) | ORAL | 0 refills | Status: DC | PRN

## 2021-10-01 NOTE — Unmapped (Signed)
pt is requesting a prerscription for hydrochlorothiazide,-she forgot to mention it a her visit yesterday.Thanks

## 2021-10-02 NOTE — Unmapped (Signed)
Clarified with pt she was asking about HCQ. Advised her that she should have plenty of refills with [facility] and she should call them. She verbalized understanding.

## 2021-10-03 DIAGNOSIS — M48061 Spinal stenosis, lumbar region without neurogenic claudication: Principal | ICD-10-CM

## 2021-10-03 DIAGNOSIS — M797 Fibromyalgia: Principal | ICD-10-CM

## 2021-10-03 DIAGNOSIS — Z7952 Long term (current) use of systemic steroids: Principal | ICD-10-CM

## 2021-10-03 DIAGNOSIS — G8929 Other chronic pain: Principal | ICD-10-CM

## 2021-10-03 DIAGNOSIS — G894 Chronic pain syndrome: Principal | ICD-10-CM

## 2021-10-03 DIAGNOSIS — M542 Cervicalgia: Principal | ICD-10-CM

## 2021-10-03 DIAGNOSIS — M545 Chronic midline low back pain: Principal | ICD-10-CM

## 2021-10-03 DIAGNOSIS — M159 Polyosteoarthritis, unspecified: Principal | ICD-10-CM

## 2021-10-03 MED ORDER — CYCLOBENZAPRINE 5 MG TABLET
ORAL_TABLET | 2 refills | 0 days
Start: 2021-10-03 — End: ?

## 2021-10-04 DIAGNOSIS — M3219 Other organ or system involvement in systemic lupus erythematosus: Principal | ICD-10-CM

## 2021-10-04 MED ORDER — MYCOPHENOLATE SODIUM 360 MG TABLET,DELAYED RELEASE
ORAL_TABLET | Freq: Two times a day (BID) | ORAL | 3 refills | 90.00000 days | Status: CP
Start: 2021-10-04 — End: ?
  Filled 2021-10-13: qty 360, 90d supply, fill #0

## 2021-10-04 NOTE — Unmapped (Signed)
Mycophenolate refill request  Last Visit Date: 09/30/2021  Next Visit Date: 10/15/2021    Lab Results   Component Value Date    ALT 12 09/30/2021    AST 21 09/30/2021    ALBUMIN 3.8 09/30/2021    CREATININE 0.90 (H) 09/30/2021     Lab Results   Component Value Date    WBC 4.4 09/30/2021    HGB 13.6 09/30/2021    HCT 41.6 09/30/2021    PLT 343 09/30/2021     Lab Results   Component Value Date    NEUTROPCT 40.9 09/30/2021    LYMPHOPCT 43.1 09/30/2021    MONOPCT 9.8 09/30/2021    EOSPCT 5.1 09/30/2021    BASOPCT 1.1 09/30/2021

## 2021-10-05 LAB — VITAMIN D 25 HYDROXY: VITAMIN D, TOTAL (25OH): 44.6 ng/mL (ref 20.0–80.0)

## 2021-10-05 NOTE — Unmapped (Signed)
Clinical Assessment Needed For: Dose Change  Medication: Mycophenolate 360mg  EC tablet  Last Fill Date/Day Supply: 08/12/2021 / 90 days  Copay $0  Was previous dose already scheduled to fill: No    Notes to Pharmacist: N/A

## 2021-10-07 LAB — ANTI-DNA ANTIBODY, DOUBLE-STRANDED: DSDNA ANTIBODY: NEGATIVE

## 2021-10-07 NOTE — Unmapped (Signed)
I saw and evaluated the patient, participating in the key portions of the service.  I reviewed the resident’s note.  I agree with the resident’s findings and plan.     Jameon Deller, MD

## 2021-10-08 ENCOUNTER — Institutional Professional Consult (permissible substitution): Admit: 2021-10-08 | Discharge: 2021-10-08 | Payer: MEDICARE | Attending: Psychologist | Primary: Psychologist

## 2021-10-08 DIAGNOSIS — F410 Panic disorder [episodic paroxysmal anxiety] without agoraphobia: Principal | ICD-10-CM

## 2021-10-08 DIAGNOSIS — F331 Major depressive disorder, recurrent, moderate: Principal | ICD-10-CM

## 2021-10-08 DIAGNOSIS — F411 Generalized anxiety disorder: Principal | ICD-10-CM

## 2021-10-08 DIAGNOSIS — F4310 Post-traumatic stress disorder, unspecified: Principal | ICD-10-CM

## 2021-10-08 NOTE — Unmapped (Signed)
[facility]   Confidential Psychological Therapy Session      Patient Name: Caitlin Smith  Medical Record Number: [id number]  Date of Service: October 08, 2021  Attending Psychologist: Caroline More, PhD  CPT Procedure Codes: 81191 for 45 mins of face to face counseling    Due to the current declared state emergency during the coronavirus pandemic, all non-urgent medical and mental health visits have been triaged to either be delayed or take place virtually via phone or videoconferencing.   Due to the need for continued patient interaction for mental health care, pain management, and care coordination that necessitates the involvement of this provider, this visit was performed face to face using interactive technology using a HIPPA compliant audio/visual platform. This patient will be scheduled for face to face visits in the future once it has been deemed safe.      We reviewed confidentiality today. The patient was present at home (location and contact information confirmed), attended this visit alone, and consented to this virtual pain psychology visit.     Visit modifiers:   GT for Interactive Technology and CR for catastrophe/disaster related due to coronavirus pandemic    REFERRING PHYSICIAN: Clarene Essex, MD    CHIEF COMPLAINT AND REASON FOR VISIT: pain coping skills, CBT to address depression and anxiety in the setting of chronic pain    SUBJECTIVE / HISTORY OF PRESENT ILLNESS: [name] is a very pleasant 66 y.o.  female from [place], [place] with multiple chronic pain complaints related to fibromyalgia and lupus who initially met with me in October 2016, and which time she was diagnosed with severe depression, PTSD, panic disorder, and generalized anxiety.The patient returns for a therapy session today. Last follow up with me was 09/10/21.     Patient reports mood to be worse, more sad and more down and having a hard time to motivate in the last 2months.  She denies any SI or safety concerns.  She had been followed by [facility] psychiatry but has been seen by the resident clinic and has gone lengthy periods where she does not have a provider and therefore cannot talk to anyone about medication changes in between appointments. Reviewed options in terms of options outside of [facility] for better continuity. Pt has appt with new psychiatry resident in March but is concerned that she will be transitioned again and have a period where she has no provider and will not have her MH needs met. Reviewed Problem Solving - could go through [facility] (Medicare Primary, Medicaid Secondary), could consider going OON and use just Medicare, will discuss with old psyhciatrist - Dr. Dayton Scrape and PCP.     Reviewed BA strategies - daily walking goal (problem solved barriers - weather and pain) and early AM sunlight exposure / light therapy in the event that part of this SAD affected.       OBJECTIVE / MENTAL STATUS:    Appearance:   Appears stated age and Clean/Neat   Motor:  No abnormal movements   Speech/Language:   Normal rate, volume, tone, fluency   Mood:  Depressed and Anxious   Affect:  blunted   Thought process:  Logical, linear, clear, coherent, goal directed   Thought content:    Denies SI, HI, self harm, delusions, obsessions, paranoid ideation, or ideas of reference   Perceptual disturbances:    Denies auditory and visual hallucinations, behavior not concerning for response to internal stimuli   Orientation:  Oriented to person, place, time,  and general circumstances   Attention:  Able to fully attend without fluctuations in consciousness   Concentration:  Able to fully concentrate and attend   Memory:  Immediate, short-term, long-term, and recall grossly intact    Fund of knowledge:   Consistent with level of education and development   Insight:    Fair   Judgment:   Intact   Impulse Control:  Intact     DIAGNOSTIC IMPRESSION:   Post Traumatic Stress Disorder (PTSD)  Generalized Anxiety Disorder (GAD)  Panic Disorder  Major Depressive Disorder, moderate, recurrent  Chronic pain syndrome  Fibromyalgia  Lupus    ASSESSMENT:   [name] is a very pleasant 66 y.o.  female from [place], [place] with multiple chronic pain complaints related to lupus, arthritis, and fibromyalgia. She was previously seen in our clinic from 2012-2014, and reestablished care with Dr. Fayrene Fearing in August 2016. The patient is struggling with depression and anxiety, but is very motivated to participate in intensive, multidisciplinary treatment to address the connection between depression, anxiety and pain. She has a long-standing history of depression and anxiety, and has worked with outpatient psychiatrist and therapist over the years. She is currently established with [facility] psychiatry, and tapered off Klonopin sometime ago and continues on venlafaxine.  She needs to connect with a new psychiatry provider. In general, depression, anxiety, pain coping and panic have all improved, but the patient evidences a seasonal affective pattern of mood changes typically her mood tends to be worse during the winter.     PLAN:   (1) Psychotherapy - Continue CBT. CBT will be used to address PTSD, MDD, Panic D/o, GAD, and chronic pain.     --Behavioral Activation - Encouraged behavioral activation.  Is trying.  --Downloaded and uses Insight Timer, guided relaxation app, for nightly practice.  --Continues to utilize diaphragmatic breathing daily  --encouraged early morning sunlight / light therapy for possible SAD in the setting of chronic MDD    (2) Psychiatry - Pt currently established with [facility].  She has been transitioned from 1 provider to another but next psychiatrist at [facility] isn't available until March.    (3) Safety - Pt denies any current or recent SI or safety concerns. Knows to call 911 or go to her local ED. Also previously given [facility].      (4) follow-up - with me in 1 month

## 2021-10-11 NOTE — Unmapped (Signed)
[facility] Clinical Assessment & Refill Coordination Note    Caitlin Smith, DOB: Apr 05, 1956  Phone: [phone] (home)     All above HIPAA information was verified with patient.     Was a Nurse, learning disability used for this call? No    Specialty Medication(s):   Inflammatory Disorders: mycophenolate     Current Outpatient Medications   Medication Sig Dispense Refill   ??? albuterol HFA 90 mcg/actuation inhaler Inhale 2 puffs every six (6) hours as needed for wheezing. 8 g 11   ??? amLODIPine (NORVASC) 2.5 MG tablet Take 3 tablets by mouth daily.     ??? aspirin (ECOTRIN) 81 MG tablet Take 81 mg by mouth daily.     ??? atenoloL (TENORMIN) 25 MG tablet Take 25 mg by mouth daily.     ??? atorvastatin (LIPITOR) 40 MG tablet Take 40 mg by mouth daily.     ??? benzonatate (TESSALON PERLES) 100 MG capsule Take 1 capsule (100 mg total) by mouth every six (6) hours as needed for cough. 30 capsule 1   ??? biotin 1 mg cap Take by mouth.     ??? brompheniramine-pseudoephedrine-DM 2-30-10 mg/5 mL syrup TAKE 5 ML BY MOUTH EVERY 6 HOURS AS NEEDED     ??? buPROPion (WELLBUTRIN XL) 150 MG 24 hr tablet Take 1 tablet (150 mg total) by mouth every morning. 90 tablet 1   ??? calcipotriene (DOVONOX) 0.005 % ointment Apply topically daily. Use in skin folds and on the lower leg. 120 g 2   ??? clobetasoL (TEMOVATE) 0.05 % ointment Apply topically Two (2) times a week. 30 g 3   ??? clonazePAM (KLONOPIN) 0.5 MG tablet Take 0.5 tablets (0.25 mg total) by mouth nightly. 15 tablet 2   ??? clotrimazole-betamethasone (LOTRISONE) 1-0.05 % cream Apply 1 application topically Two (2) times a day.     ??? cyclobenzaprine (FLEXERIL) 5 MG tablet Take 1 tablet (5 mg total) by mouth two (2) times a day as needed. 60 tablet 2   ??? diclofenac sodium (VOLTAREN) 1 % gel Apply 2 g topically two (2) times a day as needed for arthritis. 300 g 5   ??? dicyclomine (BENTYL) 20 mg tablet Take 20 mg by mouth every six (6) hours.     ??? doxycycline (VIBRA-TABS) 100 MG tablet  (Patient not taking: Reported on 09/30/2021)     ??? famotidine (PEPCID) 20 MG tablet Take 1 tablet (20 mg total) by mouth Two (2) times a day. 60 tablet 11   ??? fluticasone propionate (FLONASE) 50 mcg/actuation nasal spray      ??? fluticasone propionate (FLOVENT HFA) 220 mcg/actuation inhaler Inhale 1 puff Two (2) times a day. 12 g 11   ??? gabapentin (NEURONTIN) 300 MG capsule Take 2 capsules (600 mg total) by mouth Three (3) times a day. 360 capsule 2   ??? hydrALAZINE (APRESOLINE) 10 MG tablet      ??? hydrOXYchloroQUINE (PLAQUENIL) 200 mg tablet Take 1 tablet (200 mg total) by mouth Two (2) times a day. 180 tablet 3   ??? hydrOXYzine (ATARAX) 10 MG tablet      ??? inhalational spacing device (AEROCHAMBER MV) Spcr 1 each by Miscellaneous route two (2) times a day. With Fovent 1 each 0   ??? ketoconazole (NIZORAL) 2 % cream APP EXT AA D  0   ??? lamoTRIgine (LAMICTAL) 200 MG tablet Take 1 tablet (200 mg total) by mouth Two (2) times a day. 180 tablet 1   ???  linaCLOtide (LINZESS) 145 mcg capsule Take 1 capsule (145 mcg total) by mouth daily. 30 capsule 3   ??? losartan (COZAAR) 50 MG tablet Take 50 mg by mouth in the morning.     ??? meclizine (ANTIVERT) 25 mg tablet Take 25 mg by mouth Three (3) times a day as needed.      ??? melatonin 10 mg Tab Take 1 tablet by mouth.     ??? mupirocin (BACTROBAN) 2 % ointment APP EXT IEN BID  0   ??? mycophenolate (MYFORTIC) 360 MG TbEC Take 2 tablets (720 mg total) by mouth two (2) times a day. 360 tablet 3   ??? omeprazole (PRILOSEC) 40 MG capsule Take 1 capsule (40 mg total) by mouth daily. 90 capsule 3   ??? ondansetron (ZOFRAN) 4 MG tablet      ??? polyethylene glycol (GLYCOLAX) 17 gram/dose powder 2 cap fulls in a full glass of water, three times a day, for 5 days. (Patient not taking: Reported on 09/30/2021)     ??? pregabalin (LYRICA) 50 MG capsule  (Patient not taking: Reported on 09/30/2021)     ??? telmisartan (MICARDIS) 80 MG tablet Take 80 mg by mouth.     ??? traZODone (DESYREL) 100 MG tablet Take 1.5 tablets (150 mg total) by mouth nightly for 90 doses. 135 tablet 1   ??? triamcinolone (KENALOG) 0.1 % ointment Apply twice a day to affected areas when red and itchy. Stop when improved and use a barrier cream. 80 g 1   ??? venlafaxine (EFFEXOR) 100 MG tablet Take 1 tablet (100 mg total) by mouth in the morning. 30 tablet 11     No current facility-administered medications for this visit.        Changes to medications: [name] reports no changes at this time.    Allergies   Allergen Reactions   ??? Vilazodone Swelling   ??? Ace Inhibitors Other (See Comments)     Pt states she can not take ace inhibitors. Pt states she can not remember her reaction.    ??? Bee Pollen Itching     COUGH, WATERY EYES   ??? Penicillin G Rash   ??? Penicillins Rash   ??? Pollen Extracts Itching     COUGHING, WATERY EYES       Changes to allergies: No    SPECIALTY MEDICATION ADHERENCE     mycophenolate 360 mg: 7 days of medicine on hand   d     Medication Adherence    Patient reported X missed doses in the last month: 0  Specialty Medication: mycophenolate 360mg   Patient is on additional specialty medications: No  Informant: patient          Specialty medication(s) dose(s) confirmed: Regimen is correct and unchanged.     Are there any concerns with adherence? No    Adherence counseling provided? Not needed    CLINICAL MANAGEMENT AND INTERVENTION      Clinical Benefit Assessment:    Do you feel the medicine is effective or helping your condition? Yes    Clinical Benefit counseling provided? Progress note from 1/26 shows evidence of clinical benefit    Adverse Effects Assessment:    Are you experiencing any side effects? No    Are you experiencing difficulty administering your medicine? No    Quality of Life Assessment:    Quality of Life    Rheumatology  Oncology  Dermatology  Cystic Fibrosis          How many  days over the past month did your SLE  keep you from your normal activities? For example, brushing your teeth or getting up in the morning. 0    Have you discussed this with your provider? Not needed    Acute Infection Status:    Acute infections noted within Epic:  No active infections  Patient reported infection: None    Therapy Appropriateness:    Is therapy appropriate and patient progressing towards therapeutic goals? Yes, therapy is appropriate and should be continued    DISEASE/MEDICATION-SPECIFIC INFORMATION      N/A    PATIENT SPECIFIC NEEDS     - Does the patient have any physical, cognitive, or cultural barriers? No    - Is the patient high risk? No    - Does the patient require a Care Management Plan? No     SOCIAL DETERMINANTS OF HEALTH     At the [facility], we have learned that life circumstances - like trouble affording food, housing, utilities, or transportation can affect the health of many of our patients.   That is why we wanted to ask: are you currently experiencing any life circumstances that are negatively impacting your health and/or quality of life? No    Social Determinants of Health     Food Insecurity: Not on file   Tobacco Use: Low Risk    ??? Smoking Tobacco Use: Never   ??? Smokeless Tobacco Use: Never   ??? Passive Exposure: Not on file   Transportation Needs: Not on file   Alcohol Use: Not on file   Housing/Utilities: Not on file   Substance Use: Not on file   Financial Resource Strain: Not on file   Physical Activity: Not on file   Health Literacy: Not on file   Stress: Not on file   Intimate Partner Violence: Not on file   Depression: Not on file   Social Connections: Not on file       Would you be willing to receive help with any of the needs that you have identified today? Not applicable       SHIPPING     Specialty Medication(s) to be Shipped:   Inflammatory Disorders: mycophenolate    Other medication(s) to be shipped: No additional medications requested for fill at this time     Changes to insurance: No    Delivery Scheduled: Yes, Expected medication delivery date: 2/9.     Medication will be delivered via UPS to the confirmed prescription address in [facility].    The patient will receive a drug information handout for each medication shipped and additional FDA Medication Guides as required.  Verified that patient has previously received a Conservation officer, historic buildings and a Surveyor, mining.    The patient or caregiver noted above participated in the development of this care plan and knows that they can request review of or adjustments to the care plan at any time.      All of the patient's questions and concerns have been addressed.    Julianne Rice   [facility] Specialty Pharmacist

## 2021-10-11 NOTE — Unmapped (Signed)
Patient is scheduled for tomorrow at [facility] with an arrival time of 0830 for a colonoscopy. She is calling because she has just this morning read her instructions for an Extended Prep. She is questioning if she should reschedule?  She takes Linzess for constipation.  Advised patient she is able to reschedule if she feels she would not have a clean colon by just taking Golytely prep and today's dose of Linzess. She has decided to procedure with the prep instructions starting with the Golytely and having a clear liquid diet today.  The prep solution you will be receiving is unflavored Golytely. You may add two Crystal Light Pitcher Packs to your prep solution for flavor if you prefer flavoring. Acceptable Crystal Light flavor options include lemonade, orange, or the iced tea flavors.   Due to early appointment time of 08:30 , advised starting prep at 3pm instead of 5pm and continuing as tolerated until complete. Recommended at least two hour break between first and second half of prep solution. Nothing to eat or drink following the prep other than black coffee, black tea, or water.   Also advised patient if she does not have tea colored stool without particles, after completing her Golytely to please call the on Call GI Fellow for further instructions.  We also review her medications she could and could not take. Also reviewed clear liquid diet. What I can Eat/Drink on a Liquid Diet   ??? Gelatin/Jello, but not red, blue or purple in color.  ??? Fat-free milk, fat-free yogurt, fat-free pudding (plain/vanilla flavors only)  ??? Fat-free, clear broth (strain all pulp and fat, no beans or veggies)  ??? Clear Liquids (nothing that is red, blue, or purple color): water, sports drinks, sodas (Coke or Pepsi are okay), clear juice with no pulp, coffee and tea (sugar is okay), [place] ice    What I need to AVOID on a Liquid Diet  ??? Do NOT drink anything with particles, oil or fat. Strain first.  ??? No red-, blue-, or purple-colored drinks (e.g., sports drinks, sodas, juices)  ??? No orange or pineapple juice, or any juice with pulp  ??? No fiber supplements  Patient verbalized understanding and has no further questions at this time. I spent more than 20 minutes discussing her plan of care.

## 2021-10-12 ENCOUNTER — Ambulatory Visit: Admit: 2021-10-12 | Discharge: 2021-10-12 | Payer: MEDICARE

## 2021-10-12 ENCOUNTER — Encounter: Admit: 2021-10-12 | Discharge: 2021-10-12 | Payer: MEDICARE | Attending: Anesthesiology | Primary: Anesthesiology

## 2021-10-12 LAB — HM COLONOSCOPY

## 2021-10-12 MED ADMIN — propofoL (DIPRIVAN) injection: INTRAVENOUS | @ 14:00:00 | Stop: 2021-10-12

## 2021-10-12 MED ADMIN — ondansetron (ZOFRAN) injection: INTRAVENOUS | @ 14:00:00 | Stop: 2021-10-12

## 2021-10-12 MED ADMIN — sodium chloride (NS) 0.9 % infusion: 10 mL/h | INTRAVENOUS | @ 14:00:00 | Stop: 2021-10-12

## 2021-10-12 MED ADMIN — sodium chloride (NS) 0.9 % infusion: INTRAVENOUS | @ 14:00:00 | Stop: 2021-10-12

## 2021-10-12 NOTE — Unmapped (Signed)
Patient contacted GI fellow on call at 5:30am. She is scheduled for colonoscopy at 8:30am. States she consumed entire jug of Golytely by 7pm last night. Most recent stool output this morning is a yellow liquid, but still with some sediment so she is concerned she may not be entirely clear. I advised her to consume Miralax mixed into Gatorade this morning (she has at home), but cautioned that she will need to be strict NPO by 6:30am. She acknowledged understanding.

## 2021-10-13 ENCOUNTER — Other Ambulatory Visit: Payer: Self-pay | Admitting: Family Medicine

## 2021-10-13 DIAGNOSIS — F32A Depressive disorder: Principal | ICD-10-CM

## 2021-10-13 DIAGNOSIS — F411 Generalized anxiety disorder: Principal | ICD-10-CM

## 2021-10-13 MED ORDER — LAMOTRIGINE 200 MG TABLET
ORAL_TABLET | 3 refills | 0 days
Start: 2021-10-13 — End: ?

## 2021-10-13 MED ORDER — BUPROPION HCL XL 150 MG 24 HR TABLET, EXTENDED RELEASE
ORAL_TABLET | 3 refills | 0 days
Start: 2021-10-13 — End: ?

## 2021-10-13 MED ORDER — TRAZODONE 100 MG TABLET
ORAL_TABLET | 3 refills | 0 days
Start: 2021-10-13 — End: ?

## 2021-10-13 NOTE — Unmapped (Signed)
I can call to offer the 5th slot if it approved by you; I am unable to schedule in 5th slot unless expressly approved by clinicians.    Thanks!  [place]

## 2021-10-13 NOTE — Unmapped (Signed)
Pt called to request availability for a sooner appt with another clinician within [facility] clinic and would like to be seen before Mar 14. Pt has not met with transition resident yet in [facility] clinic. Last appt completed was Dr. Bartolo Darter Nov 2022

## 2021-10-14 MED ORDER — LAMOTRIGINE 200 MG TABLET
ORAL_TABLET | 3 refills | 0 days | Status: CP
Start: 2021-10-14 — End: ?

## 2021-10-14 MED ORDER — TRAZODONE 100 MG TABLET
ORAL_TABLET | 3 refills | 0 days | Status: CP
Start: 2021-10-14 — End: ?

## 2021-10-14 MED ORDER — CLONAZEPAM 0.5 MG TABLET
ORAL_TABLET | Freq: Every evening | ORAL | 2 refills | 30 days
Start: 2021-10-14 — End: ?

## 2021-10-14 MED ORDER — BUPROPION HCL XL 150 MG 24 HR TABLET, EXTENDED RELEASE
ORAL_TABLET | 3 refills | 0 days | Status: CP
Start: 2021-10-14 — End: ?

## 2021-10-14 NOTE — Unmapped (Signed)
Patient called requesting a refill of her klonopin. Pharmacy verified as [facility] in [place]. Patient also requesting to ask the doctor about switching her venlafaxine back to the extended release capsule. She states that she has noticed feeling more down and depressed since taking the venlafaxine tablet. Explained message would be sent to the MD. Patient verbalized understanding.

## 2021-10-15 MED ORDER — CLONAZEPAM 0.5 MG TABLET
ORAL_TABLET | Freq: Every evening | ORAL | 2 refills | 30 days | Status: CP
Start: 2021-10-15 — End: ?

## 2021-10-28 MED ORDER — LINZESS 145 MCG CAPSULE
ORAL_CAPSULE | 3 refills | 0 days
Start: 2021-10-28 — End: ?

## 2021-11-04 ENCOUNTER — Ambulatory Visit: Admit: 2021-11-04 | Discharge: 2021-11-05 | Payer: MEDICARE

## 2021-11-04 DIAGNOSIS — I10 Essential (primary) hypertension: Principal | ICD-10-CM

## 2021-11-04 DIAGNOSIS — N181 Chronic kidney disease, stage 1: Principal | ICD-10-CM

## 2021-11-04 DIAGNOSIS — M329 Systemic lupus erythematosus, unspecified: Principal | ICD-10-CM

## 2021-11-05 LAB — RENAL FUNCTION PANEL
ALBUMIN: 3.5 g/dL (ref 3.4–5.0)
ANION GAP: 6 mmol/L (ref 5–14)
BLOOD UREA NITROGEN: 15 mg/dL (ref 9–23)
BUN / CREAT RATIO: 16
CALCIUM: 9.7 mg/dL (ref 8.7–10.4)
CHLORIDE: 104 mmol/L (ref 98–107)
CO2: 30 mmol/L (ref 20.0–31.0)
CREATININE: 0.92 mg/dL — ABNORMAL HIGH
EGFR CKD-EPI (2021) FEMALE: 70 mL/min/{1.73_m2} (ref >=60)
GLUCOSE RANDOM: 86 mg/dL (ref 70–179)
PHOSPHORUS: 4.3 mg/dL (ref 2.4–5.1)
POTASSIUM: 5.5 mmol/L — ABNORMAL HIGH (ref 3.4–4.8)
SODIUM: 140 mmol/L (ref 135–145)

## 2021-11-05 LAB — PROTEIN / CREATININE RATIO, URINE
CREATININE, URINE: 139.4 mg/dL
PROTEIN URINE: 21.3 mg/dL
PROTEIN/CREAT RATIO, URINE: 0.153

## 2021-11-08 ENCOUNTER — Ambulatory Visit: Admit: 2021-11-08 | Discharge: 2021-11-09 | Payer: MEDICARE | Attending: Nephrology | Primary: Nephrology

## 2021-11-08 DIAGNOSIS — E669 Obesity, unspecified: Principal | ICD-10-CM

## 2021-11-08 DIAGNOSIS — I10 Essential (primary) hypertension: Principal | ICD-10-CM

## 2021-11-08 DIAGNOSIS — M329 Systemic lupus erythematosus, unspecified: Principal | ICD-10-CM

## 2021-11-08 DIAGNOSIS — Z6837 Body mass index (BMI) 37.0-37.9, adult: Principal | ICD-10-CM

## 2021-11-08 NOTE — Unmapped (Signed)
I have referred you again to the weight management clinic at [facility]. If you do not hear to schedule an appointment, you can call the clinic at [phone] and ask.
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
## 2021-11-09 MED ORDER — LINZESS 145 MCG CAPSULE
ORAL_CAPSULE | 3 refills | 0 days | Status: CP
Start: 2021-11-09 — End: ?

## 2021-11-12 ENCOUNTER — Institutional Professional Consult (permissible substitution): Admit: 2021-11-12 | Discharge: 2021-11-13 | Payer: MEDICARE | Attending: Psychologist | Primary: Psychologist

## 2021-11-12 DIAGNOSIS — F410 Panic disorder [episodic paroxysmal anxiety] without agoraphobia: Principal | ICD-10-CM

## 2021-11-12 DIAGNOSIS — F331 Major depressive disorder, recurrent, moderate: Principal | ICD-10-CM

## 2021-11-12 DIAGNOSIS — F411 Generalized anxiety disorder: Principal | ICD-10-CM

## 2021-11-12 DIAGNOSIS — F4310 Post-traumatic stress disorder, unspecified: Principal | ICD-10-CM

## 2021-11-12 NOTE — Unmapped (Signed)
[facility]   Confidential Psychological Therapy Session      Patient Name: Caitlin Smith  Medical Record Number: [id number]  Date of Service: November 12, 2021  Attending Psychologist: Caroline More, PhD  CPT Procedure Codes: 81191 for 45 mins of face to face counseling    Due to the current declared state emergency during the coronavirus pandemic, all non-urgent medical and mental health visits have been triaged to either be delayed or take place virtually via phone or videoconferencing.   Due to the need for continued patient interaction for mental health care, pain management, and care coordination that necessitates the involvement of this provider, this visit was performed face to face using interactive technology using a HIPPA compliant audio/visual platform. This patient will be scheduled for face to face visits in the future once it has been deemed safe.      We reviewed confidentiality today. The patient was present at home (location and contact information confirmed), attended this visit alone, and consented to this virtual pain psychology visit.     Visit modifiers:   GT for Interactive Technology and CR for catastrophe/disaster related due to coronavirus pandemic    REFERRING PHYSICIAN: Clarene Essex, MD    CHIEF COMPLAINT AND REASON FOR VISIT: pain coping skills, CBT to address depression and anxiety in the setting of chronic pain    SUBJECTIVE / HISTORY OF PRESENT ILLNESS: [name] is a very pleasant 66 y.o.  female [place], [place] with multiple chronic pain complaints related to fibromyalgia and lupus who initially met with me in October 2016, and which time she was diagnosed with severe depression, PTSD, panic disorder, and generalized anxiety.The patient returns for a therapy session today. Last follow up with me was 10/08/21.     Patient reports mood to be fair, denies any SI, and notes feeling reassured that she has a Psychiatry appointment now scheduled in May. Caitlin Smith had an issue with her voicemail. Apparently her daughter gave her a new cell for Christmas but did not set a voicemail and the patient is unable to to do it on her own. Today she says she will try to do it even if she messes up and if that doesn't work to more clearly express the need to her daughter. Reviewed communication strategies.     Caitlin Smith says she is trying to continue beh activation coping. She has been using ARC and pacing strategies to not overdo it.       OBJECTIVE / MENTAL STATUS:    Appearance:   Appears stated age and Clean/Neat   Motor:  No abnormal movements   Speech/Language:   Normal rate, volume, tone, fluency   Mood:  Depressed and Anxious   Affect:  blunted   Thought process:  Logical, linear, clear, coherent, goal directed   Thought content:    Denies SI, HI, self harm, delusions, obsessions, paranoid ideation, or ideas of reference   Perceptual disturbances:    Denies auditory and visual hallucinations, behavior not concerning for response to internal stimuli   Orientation:  Oriented to person, place, time, and general circumstances   Attention:  Able to fully attend without fluctuations in consciousness   Concentration:  Able to fully concentrate and attend   Memory:  Immediate, short-term, long-term, and recall grossly intact    Fund of knowledge:   Consistent with level of education and development   Insight:    Fair   Judgment:   Intact   Impulse  Control:  Intact     DIAGNOSTIC IMPRESSION:   Post Traumatic Stress Disorder (PTSD)  Generalized Anxiety Disorder (GAD)  Panic Disorder  Major Depressive Disorder, moderate, recurrent  Chronic pain syndrome  Fibromyalgia  Lupus    ASSESSMENT:   [name] is a very pleasant 66 y.o.  female from [place], [place] with multiple chronic pain complaints related to lupus, arthritis, and fibromyalgia. She was previously seen in our clinic from 2012-2014, and reestablished care with Dr. Fayrene Fearing in August 2016. The patient is struggling with depression and anxiety, but is very motivated to participate in intensive, multidisciplinary treatment to address the connection between depression, anxiety and pain. She has a long-standing history of depression and anxiety, and has worked with outpatient psychiatrist and therapist over the years. She is currently established with [facility] psychiatry, and tapered off Klonopin sometime ago and continues on venlafaxine.  She needs to connect with a new psychiatry provider. In general, depression, anxiety, pain coping and panic have all improved, but the patient evidences a seasonal affective pattern of mood changes typically her mood tends to be worse during the winter.     PLAN:   (1) Psychotherapy - Continue CBT. CBT will be used to address PTSD, MDD, Panic D/o, GAD, and chronic pain.     --Behavioral Activation - Encouraged behavioral activation.  Is trying.  --Downloaded and uses Insight Timer, guided relaxation app, for nightly practice.  --Continues to utilize diaphragmatic breathing daily  --encouraged early morning sunlight / light therapy for possible SAD in the setting of chronic MDD    (2) Psychiatry - Caitlin Smith currently established with [facility].  She has been transitioned from 1 provider to another but next psychiatrist at [facility] isn't available until March.    (3) Safety - Caitlin Smith denies any current or recent SI or safety concerns. Knows to call 911 or go to her local ED. Also previously given [facility].      (4) follow-up - with me in 1 month

## 2021-11-14 NOTE — Unmapped (Signed)
[facility]  Psychiatry   Established Patient E&M Service - Outpatient       Assessment:    Caitlin Smith presents for follow-up evaluation. Patient reports persistent symptoms of anxiety and depression through the past few weeks. No acute safety concerns, patient completed safety planning. Plan, increase Wellbutrin to 300mg . Close follow up.           Identifying Information:    Caitlin Smith is a 66 y.o. female with a history of  MDD, GAD, and Panic Disorder in the context of many chronic medical conditions including chronic pain/fibromyalgia, osteoarthritis with degenerative disc disease, Lupus, pituitary tumor (benign), s/p right total knee arthroplasty, and VIN II (s/p resection), who presents for follow-up evaluation. She has been maintained on Lamictal for many years as well as cymbalta, trazodone, klonopin, and wellbutrin. Patient has previously tolerated a slight taper in her klonopin from 0.75 mg to 0.25 mg qHS overtime. Her mood is often exacerbated by steroid use for current treatment of pulmonary disease. Patient is engaged with CBT therapy at the pain clinic.    Past medications:  Cymbalta    Risk Assessment:  An assessment of suicide and violence risk factors was performed as part of this evaluation and is not significantly changed from the last visit. While future psychiatric events cannot be accurately predicted, the patient does not currently require acute inpatient psychiatric care and does not currently meet [facility] involuntary commitment criteria.      Plan:    Problem: Major depression, Anxiety NOS, Panic Disorder   Status of problem: chronic with mild exacerbation  Interventions:   -- Continue Lamictal 400mg  qhs for mood  -- Continue Effexor 100 mg daily   -- Increase Wellbutrin XL 300 qAM   -- Continue Clonazepam 0.25mg  at bedtime  Last PDMP Review: 08/02/2021  2:57 PM  -- Continue CBT at pain clinic    Problem: Insomnia   Status of problem: chronic and stable  Interventions:   Last sleep study performed in 2017. Per chart review, showed mild OSA likely related to medication use. Neurology recommends patient avoid sleeping on back as she likely has OSA when sleeping in this position  -- Continue melatonin 3-5mg  at bedtime   -- Continue trazodone 100mg  qHS for insomnia (d5/4/21, d 02/05/20, i9/8,)  -- Clonazepam per above    Problem: High Risk Medication monitoring   Status of problem:  Stable   Interventions:   The complexity of this patient's care involves drug therapy which requires intensive monitoring for toxicity. This is in part due to a narrow therapeutic window, as well as potential for toxicity. This patient is being treated with Lamotrigine (Lamictal) to target their psychiatric disorder, Major depressive disorder, refractory. In regards to this medication being considered high risk, Lamotrigine (lamictal) has black box warning for serious rash including fatal reaction of Stevens-Johnson syndrome and rare cases of toxic epidermal necrolysis. In addition to prolonged titration to mitigate risk of serious rash, lamotrigine requires monitoring of hepatic and renal function at baseline, and at times will require monitoring of lamotrigine blood levels.    Problem: Mild Major Neurocognitive Disorder,   Status of problem:  new problem to this provider  Interventions:  Patient seen by Neurology ([facility]) 05/22/18 who dx pt with mild neurocognitive impairment w/ deficits in inattention and visual-spatial function with suspicion that sleep apnea and polypharmacy may be contributing to her difficulties. Recommended that psychopharm regimen be simplified, including eliminating trazodone and considering changing  Wellbutrin to Zoloft or Lexapro for anxiety and eliminating Klonopin when stable.   - However, it is very likely that patient's lupus is significantly contributing to cognitive decline, which will limit improvement she will see with reduction in polypharmacy  -- Last MOCA 22 on 12/12/2017  -- Need to repeat MOCA.     Problem: Polypharmacy   Status of problem:  chronic and stable  Interventions:  -- may be contributing to neurocognitive disorder   -- will consider trazodone and klonopin taper     Lamotrigine Monitoring  - Hepatic function testing, platelets (via CBC) q6 months  - AST/ALT WNL     Effexor   --Impaired renal function can decrease clearance of effexor  -- most recent CrCl 89   -- will continue to monitor along with PCP given dx lupus    Psychotherapy provided:  No billable psychotherapy service provided but brief supportive therapy was utilized.    Patient has been given this writer's contact information as well as the [facility] Psychiatry urgent line number. The patient has been instructed to call 911 for emergencies.    Subjective:    Interval History:   What should I know about you/Fun fact: Grandmum of 5, mum of 2 people.   What do you want to work on today: To review my medications. She wants to learn resiliency skills. She was also interested in safety planning for when she was in crisis. Expressed frustration that she feel lost in the care.  Discussed the crisis that she was going through before. It was depressive episode with increased anxiety. She was attempting to get ahead of the anxiety. Notes she was most worrying about her depression escalating.  Fall in December was secondary to mechanical fall.   Mood: Overall, okay   Sleep Pretty good, throughout the night.   Medications:   Lamictal 400mg  at bedtime ---she feels it helpful   Effexor 100mg  daily ---she feels it helpful  Wellbutrin 150mg  daily--tolerating    Clonazepam .25mg  at bedtime.   Trazadone 100mg  at bedtime   Melatonin prn   Safety Concerns/SI/SIB: History of SI---denies SI recently. Motivated to live a good life. If that happens again she will reach out to the clinic   Collateral: No collateral        Objective:    Mental Status Exam:    Appearance:    Appears stated age, Well nourished and Clean/Neat   Motor:   tremulous, appropriate eye contact    Speech/Language:    Normal rate, volume, tone, fluency and Language intact, well formed   Mood:   Anxious   Affect:   Anxious, Cooperative and Full   Thought process and Associations:   Logical, linear, clear, coherent, goal directed   Abnormal/psychotic thought content:     Denies SI, HI, self harm, delusions, obsessions, paranoid ideation, or ideas of reference   Perceptual disturbances:     Denies auditory and visual hallucinations, behavior not concerning for response to internal stimuli     Other:   insight/judgement intact      LAMICTAL MONITORING: Hepatic function testing, platelets (via CBC) at no defined frequency, but should do at baseline and can do periodically  Lamictal Level: No results found for: LAMOTRIGINE     LFTs:   Lab Results   Component Value Date    AST 21 09/30/2021    AST 11 04/29/2021    AST 28 12/29/2020    AST 24 04/08/2019    AST 32 11/05/2014  AST 30 05/08/2014      Lab Results   Component Value Date    ALT 12 09/30/2021    ALT 14 04/29/2021    ALT 16 12/29/2020    ALT 13 04/08/2019    ALT 21 11/05/2014    ALT 31 05/08/2014     Platelets:   Lab Results   Component Value Date    Platelet 343 09/30/2021    Platelet 306 08/11/2021    Platelet 347 04/29/2021    Platelet 349 04/08/2019    Platelet 304 11/05/2014    Platelet 347 05/08/2014     Lab Results   Component Value Date    WBC 4.4 09/30/2021    HGB 13.6 09/30/2021    HCT 41.6 09/30/2021    PLT 343 09/30/2021     Patient was seen and plan of care was discussed with the Attending MD, Evette Cristal who agrees with the above statement and plan.     Portions of this record have been created using Scientist, clinical (histocompatibility and immunogenetics). Dictation errors have been sought, but may not have been identified and corrected.     Ovid Curd   PGY-2 Psychiatry          The patient reports they are currently: at home. I spent  minutes on the real-time audio and video with the patient on the date of service. I spent an additional 5 minutes on pre- and post-visit activities on the date of service.     The patient was physically located in [place] or a state in which I am permitted to provide care. The patient and/or parent/guardian understood that s/he may incur co-pays and cost sharing, and agreed to the telemedicine visit. The visit was reasonable and appropriate under the circumstances given the patient's presentation at the time.    The patient and/or parent/guardian has been advised of the potential risks and limitations of this mode of treatment (including, but not limited to, the absence of in-person examination) and has agreed to be treated using telemedicine. The patient's/patient's family's questions regarding telemedicine have been answered.     If the visit was completed in an ambulatory setting, the patient and/or parent/guardian has also been advised to contact their provider???s office for worsening conditions, and seek emergency medical treatment and/or call 911 if the patient deems either necessary.

## 2021-11-16 ENCOUNTER — Telehealth
Admit: 2021-11-16 | Discharge: 2021-11-17 | Payer: MEDICARE | Attending: Student in an Organized Health Care Education/Training Program | Primary: Student in an Organized Health Care Education/Training Program

## 2021-11-16 DIAGNOSIS — F411 Generalized anxiety disorder: Principal | ICD-10-CM

## 2021-11-16 DIAGNOSIS — F339 Major depressive disorder, recurrent, unspecified: Principal | ICD-10-CM

## 2021-11-16 NOTE — Unmapped (Signed)
It was a pleasure seeing you in clinic today. We discussed the following things regarding your treatment plan:          General follow-up instructions:  - Please continue taking your medications as prescribed for your mental health.   - Do not make changes to your medications, including taking more or less than prescribed, unless under the supervision of your physician. Be aware that some medications may make you feel worse if abruptly stopped  - Please refrain from using illicit substances, as these can affect your mood and could cause anxiety or other concerning symptoms.   - Seek further medical care for any increase in symptoms or new symptoms such as thoughts of wanting to hurt yourself or hurt others.      Contact info:  - Life-threatening emergencies: Call 911 or go to the nearest ER for medical or psychiatric attention.   - Issues that need urgent attention but are not life threatening: Call the clinic outpatient frontdesk at [phone] for assistance.   - Non-urgent routine concerns, questions, and refill requests: The best way to reach me is through a MyChart message. If you are unable to send me a message through MyChart, please leave me a voicemail at [phone] and I will get back to you within 2 business days.      Regarding appointments:  - If you need to cancel your appointment, we ask that you call [phone] at least 24 hours before your scheduled appointment.  - If for any reason you arrive 15 minutes later than your scheduled appointment time, you may not be seen and your visit may be rescheduled.  - Please remember that we will not automatically reschedule missed appointments.  - If you miss two (2) appointments without letting us know in advance, you will likely be referred to a provider in your community.  - We will do our best to be on time. Sometimes an emergency will arise that might cause your clinician to be late. We will try to inform you of this when you check in for your appointment. If you wait more than 15 minutes past your appointment time without such notice, please speak with the front desk staff.  - In the event of bad weather, the clinic staff will attempt to contact you, should your appointment need to be rescheduled. Additionally, you can call the Patient Weather Line [phone] for system-wide clinic status  - For more information and reminders regarding clinic policies (these were provided when you were admitted to the clinic), please ask the front desk.

## 2021-11-17 ENCOUNTER — Ambulatory Visit
Admit: 2021-11-17 | Payer: MEDICARE | Attending: Rehabilitative and Restorative Service Providers" | Primary: Rehabilitative and Restorative Service Providers"

## 2021-11-17 ENCOUNTER — Ambulatory Visit: Admit: 2021-11-17 | Discharge: 2021-11-18 | Payer: MEDICARE

## 2021-11-17 ENCOUNTER — Ambulatory Visit: Admit: 2021-11-17 | Payer: MEDICARE
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
## 2021-11-21 MED ORDER — BUPROPION HCL XL 300 MG 24 HR TABLET, EXTENDED RELEASE
ORAL_TABLET | Freq: Every day | ORAL | 1 refills | 90 days | Status: CP
Start: 2021-11-21 — End: 2022-11-21

## 2021-11-22 NOTE — Unmapped (Signed)
{  I interviewed the patient for 10 minutes via video link.  I discussed the case with the resident and agree with the assessment and plan as outlined in the note.

## 2021-12-10 NOTE — Progress Notes (Signed)
Name: Nicole Ballard   MRN: [id number]    DOB: 30-Aug-1956   Date:12/20/2021 ? ?     Progress Note ? ?Subjective ? ?Chief Complaint ? ?Follow Up ? ?HPI ? ?HTN: She continues to have intermittent chest pain, but not as severe and evaluated by cardiologist in the past.  Continue Atenolol 25,  Norvasc 7.5 mg and Telmisartan 80 mg , she states bp spikes at times and takes hydralazine prn. ?  ?INTERPRETATION ( done 02/2019 )  ?Normal Stress Echocardiogram ?NORMAL RIGHT VENTRICULAR SYSTOLIC FUNCTION ?TRIVIAL REGURGITATION NOTED  ?NO VALVULAR STENOSIS NOTED ?  ?History of DM: denies polyphagia, polyuria or polydipsia, she continues to gradually gain weight, she states she lives alone and has been eating because she is board. A1C is still under control 5.5 % back in Nov 2022. She asked me about medications, she is wiling to try Metformin again , specially because she also takes Lamictal.  ?  ?MDD/Mild Neurocognitive Impairment: under the care of psychiatrist  at [facility] psychiatry, she is taking Effexor 100  mg, Lamictal, Wellbutrin 300 mg now, and Klonopin qhs. She states she was very anxious when waiting for the new psychiatrist but doing better now  ?  ?Dyslipidemia: reviewed last labs, taking Atorvastatin and denies side effects. LDL 75, continue current regiment  ?  ?Post-inflammatory pulmonary fibrosis/RAD: under the care of pulmonologist at [facility], she has occasional cough she continues to have SOB - likely multifactorial. She has been using Flovent daily now also takes albuterol prn  ?  ?Lupus involvement lungs/Immunosuppression  she is seeing Rheumatologist , taking plaquenil but continues to have some SOB , eye exam is also up to date Unchanged  ?  ?Morbid obesity: : her BMI was down to 29.94 but gradually went up and and is now above 35 and she has co-morbidities such as dyslipidemia, HTN and GERD , we will try adding Metformin  ?  ?GERD: she She is on PPI and pepcid prn, sates symptoms are controlled . Seen by GI Feb  2023 ?  ?Vertigo: she still has intermittent symptoms takes Meclizine and also zofran from chronic nausea . She states about the same, she is still under the care of Dr. Darnelle Going at [facility]  ? ?Neurogenic claudication: symptoms are stable, aching when she walks , taking gabapentin but does not seem to help, she needs to take breaks when walking  ? ?FMS: stable on gabapentin, still has muscle aches, also under the care of pain clinic  ? ?Chronic nausea: taking zofran prn and controls symptoms, advised to contact Gastroenterologist  ?  ?Empty Sella: last MRI was 2021  no longer showed pituitary adenoma. Seen by neurosurgeon in the past but now released  ? ?Chronic pain from/Neurogenic claudication Lupus and FMS: going to pain clinic, she states her back pain is intense and sometimes, she also has knee pain, muscle aches. She has follow up with pain clinic this week.  ? ?Eczema: she has medication given by Dermatologist, no rashes.  ? ?Patient Active Problem List  ? Diagnosis Date Noted  ? Immunocompromised ([place]) 04/29/2021  ? Hyperglycemia 10/23/2017  ? Vertigo 09/29/2016  ? Bronchiolitis obliterans organizing pneumonia ([place]) 09/29/2016  ? Neck pain, musculoskeletal 12/15/2015  ? Abnormal CAT scan 04/11/2015  ? Auditory impairment 04/11/2015  ? Insomnia, persistent 04/11/2015  ? Chronic kidney disease (CKD), stage I 04/11/2015  ? Chronic nonmalignant pain 04/11/2015  ? Chronic constipation 04/11/2015  ? Dyslipidemia 04/11/2015  ? Fibromyalgia 04/11/2015  ? Gastro-esophageal reflux  disease without esophagitis 04/11/2015  ? Dysphonia 04/11/2015  ? Low back pain 04/11/2015  ? Eczema intertrigo 04/11/2015  ? Keratosis pilaris 04/11/2015  ? Chronic recurrent major depressive disorder ([place]) 04/11/2015  ? Dysmetabolic syndrome 85/63/1497  ? Neurogenic claudication 04/11/2015  ? Perennial allergic rhinitis with seasonal variation 04/11/2015  ? Abnormal presence of protein in urine 04/11/2015  ? Seborrhea capitis 04/11/2015  ?  Moderate dysplasia of vulva 04/11/2015  ? Vitamin D deficiency 04/11/2015  ? Spinal stenosis of lumbar region 04/11/2015  ? Treadmill stress test negative for angina pectoris 03/05/2015  ? Shortness of breath 05/20/2014  ? Asthma, chronic 12/31/2013  ? H/O total knee replacement 12/31/2013  ? Episcleritis 09/26/2013  ? Vocal cord dysfunction 05/28/2013  ? Anxiety, generalized 03/19/2013  ? Back pain 12/18/2012  ? Pulmonary nodules 11/26/2012  ? Lung nodule, multiple 11/26/2012  ? Arthritis, degenerative 11/01/2012  ? H/O aspiration pneumonitis 10/22/2012  ? Postinflammatory pulmonary fibrosis ([place]) 10/22/2012  ? Mixed incontinence 05/04/2012  ? Lung involvement in systemic lupus erythematosus ([place]) 11/05/2010  ? Chronic nausea 07/01/2008  ? Benign hypertension 01/25/2005  ? ? ?Past Surgical History:  ?Procedure Laterality Date  ? BRAIN SURGERY    ? CHOLECYSTECTOMY    ? VAGINAL HYSTERECTOMY    ? ? ?Family History  ?Problem Relation Age of Onset  ? Breast cancer Mother   ? Cancer Mother 31  ?     Breast  ? Cancer Father 58  ?     Stomach  ? Breast cancer Maternal Aunt   ? ? ?Social History  ? ?Tobacco Use  ? Smoking status: Never  ? Smokeless tobacco: Never  ?Substance Use Topics  ? Alcohol use: No  ?  Alcohol/week: 0.0 standard drinks  ? ? ? ?Current Outpatient Medications:  ?  albuterol (VENTOLIN HFA) 108 (90 Base) MCG/ACT inhaler, SMARTSIG:2 Puff(s) By Mouth Every 6 Hours PRN, Disp: , Rfl:  ?  aspirin EC 81 MG tablet, Take 1 tablet (81 mg total) by mouth daily., Disp: 30 tablet, Rfl: 0 ?  Biotin 1 MG CAPS, Take 1 capsule by mouth daily., Disp: 30 capsule, Rfl: 0 ?  buPROPion (WELLBUTRIN XL) 300 MG 24 hr tablet, Take 300 mg by mouth in the morning., Disp: , Rfl:  ?  Cholecalciferol (VITAMIN D) 2000 UNITS CAPS, Take 1 capsule by mouth daily., Disp: , Rfl:  ?  clobetasol ointment (TEMOVATE) 0.26 %, Apply 1 application topically 2 (two) times daily., Disp: , Rfl:  ?  clonazePAM (KLONOPIN) 0.5 MG tablet, Take 0.25 mg by  mouth at bedtime., Disp: , Rfl:  ?  clotrimazole-betamethasone (LOTRISONE) cream, Apply 1 application topically 2 (two) times daily., Disp: 45 g, Rfl: 0 ?  diclofenac Sodium (VOLTAREN) 1 % GEL, Apply topically daily as needed., Disp: , Rfl:  ?  fluticasone (FLONASE) 50 MCG/ACT nasal spray, Place 2 sprays into both nostrils daily., Disp: 48 g, Rfl: 3 ?  fluticasone (FLOVENT HFA) 220 MCG/ACT inhaler, Inhale into the lungs., Disp: , Rfl:  ?  gabapentin (NEURONTIN) 300 MG capsule, Take 300 mg (1 capsule) in the morning and at 300 mg (1 capsule) at noon and 600 mg (2 capsules) at night., Disp: , Rfl:  ?  hydrALAZINE (APRESOLINE) 10 MG tablet, TAKE 1 TABLET BY MOUTH THREE TIMES DAILY AS NEEDED FOR BLOOD PRESSURE ABOVE 150/90, Disp: 90 tablet, Rfl: 2 ?  hydroxychloroquine (PLAQUENIL) 200 MG tablet, Take by mouth 2 (two) times daily., Disp: , Rfl:  ?  lamoTRIgine (LAMICTAL)  200 MG tablet, Take 400 mg by mouth at bedtime., Disp: , Rfl:  ?  LINZESS 145 MCG CAPS capsule, Take 145 mcg by mouth daily., Disp: , Rfl:  ?  Melatonin 10 MG TABS, Take 1 tablet by mouth at bedtime., Disp: , Rfl:  ?  mycophenolate (MYFORTIC) 360 MG TBEC EC tablet, Take 720 mg by mouth 2 (two) times daily., Disp: , Rfl:  ?  omeprazole (PRILOSEC) 40 MG capsule, TAKE 1 CAPSULE BY MOUTH  DAILY, Disp: 90 capsule, Rfl: 3 ?  Polyethylene Glycol 3350 (MIRALAX PO), Take by mouth., Disp: , Rfl:  ?  traZODone (DESYREL) 100 MG tablet, Take 150 mg by mouth at bedtime., Disp: , Rfl:  ?  triamcinolone ointment (KENALOG) 0.1 %, , Disp: , Rfl:  ?  venlafaxine (EFFEXOR) 100 MG tablet, Take 100 mg by mouth every evening., Disp: , Rfl:  ?  amLODipine (NORVASC) 2.5 MG tablet, Take 3 tablets (7.5 mg total) by mouth daily., Disp: 270 tablet, Rfl: 1 ?  atenolol (TENORMIN) 25 MG tablet, Take 1 tablet (25 mg total) by mouth daily., Disp: 90 tablet, Rfl: 3 ?  atorvastatin (LIPITOR) 40 MG tablet, Take 1 tablet (40 mg total) by mouth daily., Disp: 90 tablet, Rfl: 1 ?  famotidine  (PEPCID) 20 MG tablet, Take by mouth., Disp: , Rfl:  ?  hydrOXYzine (ATARAX) 10 MG tablet, Take 1 tablet (10 mg total) by mouth 3 (three) times daily as needed., Disp: 30 tablet, Rfl: 0 ?  loratadine (CLARI

## 2021-12-10 NOTE — Progress Notes (Signed)
Patient wasn't seen

## 2021-12-13 ENCOUNTER — Ambulatory Visit: Payer: Self-pay | Admitting: *Deleted

## 2021-12-13 NOTE — Telephone Encounter (Signed)
? ?  Chief Complaint: BP med question ?Symptoms: headache ?Frequency: when BP elevated ?Pertinent Negatives: Patient denies other symptoms ?Disposition: '[]'$ ED /'[]'$ Urgent Care (no appt availability in office) / '[]'$ Appointment(In office/virtual)/ '[]'$  [place] [facility]/ '[x]'$ Home Care/ '[]'$ Refused Recommended Disposition /'[]'$ [place] [facility]/ '[]'$  Follow-up with PCP ?Additional Notes: Pt having headache everyday, BP elevated, wanted to review meds to make sure taking correctly. She was taking Amlodipine incorrectly as well as Hydralazine. Advised to take all 3 Amlodipine at same time (7.'5mg'$  total) and on days needs Hydralazine spread that one out throughout the day. Verbalized understanding. To call back for questions or still having elevated BP. ? ?Reason for Disposition ? [1] DOUBLE DOSE (an extra dose or lesser amount) of prescription drug AND [2] NO symptoms (Exception: a double dose of antibiotics) ? ?Answer Assessment - Initial Assessment Questions ?1. NAME of MEDICATION: "What medicine are you calling about?" ?    All BP meds ?2. QUESTION: "What is your question?" (e.g., double dose of medicine, side effect) ?    Taking wrong ?3. PRESCRIBING HCP: "Who prescribed it?" Reason: if prescribed by specialist, call should be referred to that group. ?    Dr. Ancil Boozer ?4. SYMPTOMS: "Do you have any symptoms?" ?    Headache BP over 150 ?5. SEVERITY: If symptoms are present, ask "Are they mild, moderate or severe?" ?    Just a headache ?6. PREGNANCY:  "Is there any chance that you are pregnant?" "When was your last menstrual period?" ?    na ? ?Protocols used: Medication Question Call-A-AH ? ?

## 2021-12-14 ENCOUNTER — Telehealth
Admit: 2021-12-14 | Discharge: 2021-12-15 | Payer: MEDICARE | Attending: Student in an Organized Health Care Education/Training Program | Primary: Student in an Organized Health Care Education/Training Program

## 2021-12-14 NOTE — Unmapped (Signed)
[facility]  Psychiatry   Established Patient E&M Service - Outpatient       Assessment:    Caitlin Smith presents for follow-up evaluation. Patient reports improvement in mood and anxiety with increase in Wellbutrin. Expressed some interest in therapy. Discuss with patient safety planning and how to contact MD.     [ ]  Review medications and polypharmacy at next appointment with patient.   [ ]  Continue Wellbutrin 300mg  daily   [ ]  Clonazepam .25mg --no refill needed, chart check   [ ]  Follow up in June- make it is scheduled.   [ ]  Plan to transition her to ADT clinic/[facility], panel       Identifying Information:    Caitlin Smith is a 66 y.o. female with a history of  MDD, GAD, and Panic Disorder in the context of many chronic medical conditions including chronic pain/fibromyalgia, osteoarthritis with degenerative disc disease, Lupus, pituitary tumor (benign), s/p right total knee arthroplasty, and VIN II (s/p resection), who presents for follow-up evaluation. She has been maintained on Lamictal for many years as well as cymbalta, trazodone, klonopin, and wellbutrin. Patient has previously tolerated a slight taper in her klonopin from 0.75 mg to 0.25 mg qHS overtime. Her mood is often exacerbated by steroid use for current treatment of pulmonary disease. Patient is engaged with CBT therapy at the pain clinic.    Past medications:  Cymbalta    Risk Assessment:  An assessment of suicide and violence risk factors was performed as part of this evaluation and is not significantly changed from the last visit. While future psychiatric events cannot be accurately predicted, the patient does not currently require acute inpatient psychiatric care and does not currently meet [facility] involuntary commitment criteria.      Plan:    Problem: Major depression, Anxiety NOS, Panic Disorder   Status of problem: chronic with mild exacerbation  Interventions:   -- Continue Lamictal 400mg  qhs for mood  -- Continue Effexor 100 mg daily   -- Increase Wellbutrin XL 300 qAM   -- Continue Clonazepam 0.25mg  at bedtime  Last PDMP Review: 08/02/2021  2:57 PM  -- Continue CBT at pain clinic    Problem: Insomnia   Status of problem: chronic and stable  Interventions:   Last sleep study performed in 2017. Per chart review, showed mild OSA likely related to medication use. Neurology recommends patient avoid sleeping on back as she likely has OSA when sleeping in this position  -- Continue melatonin 3-5mg  at bedtime   -- Continue trazodone 100mg  qHS for insomnia (d5/4/21, d 02/05/20, i9/8,)  -- Clonazepam per above    Problem: High Risk Medication monitoring   Status of problem:  Stable   Interventions:   The complexity of this patient's care involves drug therapy which requires intensive monitoring for toxicity. This is in part due to a narrow therapeutic window, as well as potential for toxicity. This patient is being treated with Lamotrigine (Lamictal) to target their psychiatric disorder, Major depressive disorder, refractory. In regards to this medication being considered high risk, Lamotrigine (lamictal) has black box warning for serious rash including fatal reaction of Stevens-Johnson syndrome and rare cases of toxic epidermal necrolysis. In addition to prolonged titration to mitigate risk of serious rash, lamotrigine requires monitoring of hepatic and renal function at baseline, and at times will require monitoring of lamotrigine blood levels.    Problem: Mild Major Neurocognitive Disorder,   Status of problem:  new problem to this provider  Interventions:  Patient seen by Neurology ([facility]) 05/22/18 who dx pt with mild neurocognitive impairment w/ deficits in inattention and visual-spatial function with suspicion that sleep apnea and polypharmacy may be contributing to her difficulties. Recommended that psychopharm regimen be simplified, including eliminating trazodone and considering changing Wellbutrin to Zoloft or Lexapro for anxiety and eliminating Klonopin when stable.   - However, it is very likely that patient's lupus is significantly contributing to cognitive decline, which will limit improvement she will see with reduction in polypharmacy  -- Last MOCA 22 on 12/12/2017  -- Need to repeat MOCA.     Problem: Polypharmacy   Status of problem:  chronic and stable  Interventions:  -- may be contributing to neurocognitive disorder   -- will consider trazodone and klonopin taper     Lamotrigine Monitoring  - Hepatic function testing, platelets (via CBC) q6 months  - AST/ALT WNL     Effexor   --Impaired renal function can decrease clearance of effexor  -- most recent CrCl 89   -- will continue to monitor along with PCP given dx lupus    Psychotherapy provided:  No billable psychotherapy service provided but brief supportive therapy was utilized.    Patient has been given this writer's contact information as well as the [facility] urgent line number. The patient has been instructed to call 911 for emergencies.    Subjective:    Interval History:       Patient reported feeling better with current medication adjustment. Patient requested to keep with MD Virl Cagey as she comfortable with her and appreciated the consistent in care.   Mood: Better  Sleep Pretty good, throughout the night.   Medications:   Lamictal 400mg  at bedtime ---she feels it helpful   Effexor 100mg  daily ---she feels it helpful  Wellbutrin 150mg  daily--tolerating    Clonazepam .25mg  at bedtime.   Trazadone 100mg  at bedtime   Melatonin prn   Safety Concerns/SI/SIB: History of SI---denies SI recently. Motivated to live a good life. If that happens again she will reach out to the clinic   Collateral: No collateral        Objective:    Mental Status Exam:    Appearance:    Appears stated age, Well nourished and Clean/Neat   Motor:   tremulous, appropriate eye contact    Speech/Language:    Normal rate, volume, tone, fluency and Language intact, well formed   Mood: Anxious   Affect:   Anxious, Cooperative and Full   Thought process and Associations:   Logical, linear, clear, coherent, goal directed   Abnormal/psychotic thought content:     Denies SI, HI, self harm, delusions, obsessions, paranoid ideation, or ideas of reference   Perceptual disturbances:     Denies auditory and visual hallucinations, behavior not concerning for response to internal stimuli     Other:   insight/judgement intact      LAMICTAL MONITORING: Hepatic function testing, platelets (via CBC) at no defined frequency, but should do at baseline and can do periodically  Lamictal Level: No results found for: LAMOTRIGINE     LFTs:   Lab Results   Component Value Date    AST 21 09/30/2021    AST 11 04/29/2021    AST 28 12/29/2020    AST 24 04/08/2019    AST 32 11/05/2014    AST 30 05/08/2014      Lab Results   Component Value Date    ALT 12 09/30/2021  ALT 14 04/29/2021    ALT 16 12/29/2020    ALT 13 04/08/2019    ALT 21 11/05/2014    ALT 31 05/08/2014     Platelets:   Lab Results   Component Value Date    Platelet 343 09/30/2021    Platelet 306 08/11/2021    Platelet 347 04/29/2021    Platelet 349 04/08/2019    Platelet 304 11/05/2014    Platelet 347 05/08/2014     Lab Results   Component Value Date    WBC 4.4 09/30/2021    HGB 13.6 09/30/2021    HCT 41.6 09/30/2021    PLT 343 09/30/2021     Patient was seen and plan of care was discussed with the Attending MD, Evette Cristal who agrees with the above statement and plan.     Portions of this record have been created using Scientist, clinical (histocompatibility and immunogenetics). Dictation errors have been sought, but may not have been identified and corrected.     Ovid Curd   PGY-2 Psychiatry          The patient reports they are currently: at home. I spent 35 minutes on the real-time audio and video with the patient on the date of service. I spent an additional 10 minutes on pre- and post-visit activities on the date of service.     The patient was physically located in [place] or a state in which I am permitted to provide care. The patient and/or parent/guardian understood that s/he may incur co-pays and cost sharing, and agreed to the telemedicine visit. The visit was reasonable and appropriate under the circumstances given the patient's presentation at the time.    The patient and/or parent/guardian has been advised of the potential risks and limitations of this mode of treatment (including, but not limited to, the absence of in-person examination) and has agreed to be treated using telemedicine. The patient's/patient's family's questions regarding telemedicine have been answered.     If the visit was completed in an ambulatory setting, the patient and/or parent/guardian has also been advised to contact their provider???s office for worsening conditions, and seek emergency medical treatment and/or call 911 if the patient deems either necessary.

## 2021-12-17 ENCOUNTER — Institutional Professional Consult (permissible substitution): Admit: 2021-12-17 | Discharge: 2021-12-18 | Payer: MEDICARE | Attending: Psychologist | Primary: Psychologist

## 2021-12-17 DIAGNOSIS — F331 Major depressive disorder, recurrent, moderate: Principal | ICD-10-CM

## 2021-12-17 DIAGNOSIS — F4310 Post-traumatic stress disorder, unspecified: Principal | ICD-10-CM

## 2021-12-17 DIAGNOSIS — F410 Panic disorder [episodic paroxysmal anxiety] without agoraphobia: Principal | ICD-10-CM

## 2021-12-17 DIAGNOSIS — F411 Generalized anxiety disorder: Principal | ICD-10-CM

## 2021-12-17 NOTE — Unmapped (Signed)
[facility]   Confidential Psychological Therapy Session      Patient Name: Caitlin Smith  Medical Record Number: [id number]  Date of Service: December 17, 2021  Attending Psychologist: Caroline More, PhD  CPT Procedure Codes: 08657 for 45 mins of face to face counseling    Due to the current declared state emergency during the coronavirus pandemic, all non-urgent medical and mental health visits have been triaged to either be delayed or take place virtually via phone or videoconferencing.   Due to the need for continued patient interaction for mental health care, pain management, and care coordination that necessitates the involvement of this provider, this visit was performed face to face using interactive technology using a HIPPA compliant audio/visual platform. This patient will be scheduled for face to face visits in the future once it has been deemed safe.      We reviewed confidentiality today. The patient was present at home (location and contact information confirmed), attended this visit alone, and consented to this virtual pain psychology visit.     Visit modifiers:   GT for Interactive Technology and CR for catastrophe/disaster related due to coronavirus pandemic    REFERRING PHYSICIAN: Clarene Essex, MD    CHIEF COMPLAINT AND REASON FOR VISIT: pain coping skills, CBT to address depression and anxiety in the setting of chronic pain    SUBJECTIVE / HISTORY OF PRESENT ILLNESS: [name] is a very pleasant 66 y.o.  female from [place], [place] with multiple chronic pain complaints related to fibromyalgia and lupus who initially met with me in October 2016, and which time she was diagnosed with severe depression, PTSD, panic disorder, and generalized anxiety.The patient returns for a therapy session today. Last follow up with me was 11/03/21.     Patient reports mood to be fair, denies any SI, and notes feeling reassured that she has re-established with psychiatry. Medications have been adjusted and she reports improvement in mood and anxiety.  She also reported having a great sense of confidence and security that she has regular psychiatric follow-up now.  The patient has been able to get out of the house more, and has been more active, with mood and anxiety being better managed.  Pain has been fair, worse with rapid barometric pressure changes, but generally stabilized.  She has been using activity rest cycling and pacing quite well.      Pt says she is trying to continue beh activation coping. She has been using ARC and pacing strategies to not overdo it.       OBJECTIVE / MENTAL STATUS:    Appearance:   Appears stated age and Clean/Neat   Motor:  No abnormal movements   Speech/Language:   Normal rate, volume, tone, fluency   Mood:  Depressed and Anxious   Affect:  euthymic   Thought process:  Logical, linear, clear, coherent, goal directed   Thought content:    Denies SI, HI, self harm, delusions, obsessions, paranoid ideation, or ideas of reference   Perceptual disturbances:    Denies auditory and visual hallucinations, behavior not concerning for response to internal stimuli   Orientation:  Oriented to person, place, time, and general circumstances   Attention:  Able to fully attend without fluctuations in consciousness   Concentration:  Able to fully concentrate and attend   Memory:  Immediate, short-term, long-term, and recall grossly intact    Fund of knowledge:   Consistent with level of education and development   Insight:  Fair   Judgment:   Intact   Impulse Control:  Intact     DIAGNOSTIC IMPRESSION:   Post Traumatic Stress Disorder (PTSD)  Generalized Anxiety Disorder (GAD)  Panic Disorder  Major Depressive Disorder, moderate, recurrent  Chronic pain syndrome  Fibromyalgia  Lupus    ASSESSMENT:   [name] is a very pleasant 66 y.o.  female from [place], [place] with multiple chronic pain complaints related to lupus, arthritis, and fibromyalgia. She was previously seen in our clinic from 2012-2014, and reestablished care with Caitlin Smith in August 2016. The patient has struggled with depression and anxiety, but is very motivated to participate in intensive, multidisciplinary treatment to address the connection between depression, anxiety and pain. She has a long-standing history of depression and anxiety, and has worked with outpatient psychiatrist and therapist over the years. She is currently established with [facility] psychiatry.  In general, depression, anxiety, pain coping and panic have all improved, but the patient evidences a seasonal affective pattern of mood changes typically her mood tends to be worse during the winter.     PLAN:   (1) Psychotherapy - Continue CBT. CBT will be used to address PTSD, MDD, Panic D/o, GAD, and chronic pain.     --Behavioral Activation - Encouraged behavioral activation.  Is trying.  --Downloaded and uses Insight Timer, guided relaxation app, for nightly practice.  --Continues to utilize diaphragmatic breathing daily  --encouraged early morning sunlight / light therapy for possible SAD in the setting of chronic MDD    (2) Psychiatry - Pt currently established with [facility]. Has established with a new resident and is followed by Caitlin Smith (attending)    (3) Safety - Pt denies any current or recent SI or safety concerns. Knows to call 911 or go to her local ED. Also previously given [facility].      (4) follow-up - with me in 1 month

## 2021-12-20 ENCOUNTER — Encounter: Payer: Self-pay | Admitting: Family Medicine

## 2021-12-20 ENCOUNTER — Ambulatory Visit (INDEPENDENT_AMBULATORY_CARE_PROVIDER_SITE_OTHER): Payer: Medicare Other | Admitting: Family Medicine

## 2021-12-20 VITALS — BP 114/70 | HR 79 | Resp 16 | Ht 68.0 in | Wt 241.0 lb

## 2021-12-20 DIAGNOSIS — J849 Interstitial pulmonary disease, unspecified: Secondary | ICD-10-CM | POA: Diagnosis not present

## 2021-12-20 DIAGNOSIS — E785 Hyperlipidemia, unspecified: Secondary | ICD-10-CM

## 2021-12-20 DIAGNOSIS — J3089 Other allergic rhinitis: Secondary | ICD-10-CM

## 2021-12-20 DIAGNOSIS — E88819 Insulin resistance, unspecified: Secondary | ICD-10-CM

## 2021-12-20 DIAGNOSIS — M797 Fibromyalgia: Secondary | ICD-10-CM

## 2021-12-20 DIAGNOSIS — R29818 Other symptoms and signs involving the nervous system: Secondary | ICD-10-CM

## 2021-12-20 DIAGNOSIS — I10 Essential (primary) hypertension: Secondary | ICD-10-CM

## 2021-12-20 DIAGNOSIS — L299 Pruritus, unspecified: Secondary | ICD-10-CM

## 2021-12-20 DIAGNOSIS — E8881 Metabolic syndrome: Secondary | ICD-10-CM

## 2021-12-20 DIAGNOSIS — E236 Other disorders of pituitary gland: Secondary | ICD-10-CM | POA: Diagnosis not present

## 2021-12-20 DIAGNOSIS — J302 Other seasonal allergic rhinitis: Secondary | ICD-10-CM

## 2021-12-20 DIAGNOSIS — F339 Major depressive disorder, recurrent, unspecified: Secondary | ICD-10-CM

## 2021-12-20 MED ORDER — OLOPATADINE HCL 0.1 % OP SOLN: 1.0000 [drp] | Freq: Two times a day (BID) | OPHTHALMIC | 1 refills | Status: DC

## 2021-12-20 MED ORDER — ATENOLOL 25 MG PO TABS
25.0000 mg | ORAL_TABLET | Freq: Every day | ORAL | 3 refills | Status: DC
Start: 2021-12-20 — End: 2022-04-15

## 2021-12-20 MED ORDER — AMLODIPINE BESYLATE 2.5 MG PO TABS: 7.5000 mg | ORAL_TABLET | Freq: Every day | ORAL | 1 refills | Status: DC

## 2021-12-20 MED ORDER — LORATADINE 10 MG PO TABS: 10.0000 mg | ORAL_TABLET | Freq: Every day | ORAL | 1 refills | Status: DC

## 2021-12-20 MED ORDER — ATORVASTATIN CALCIUM 40 MG PO TABS: 40.0000 mg | ORAL_TABLET | Freq: Every day | ORAL | 1 refills | Status: DC

## 2021-12-20 MED ORDER — HYDROXYZINE HCL 10 MG PO TABS: 10.0000 mg | ORAL_TABLET | Freq: Three times a day (TID) | ORAL | 0 refills | Status: DC | PRN

## 2021-12-20 MED ORDER — TELMISARTAN 80 MG PO TABS
80.0000 mg | ORAL_TABLET | Freq: Every day | ORAL | 1 refills | Status: DC
Start: 2021-12-20 — End: 2022-01-05

## 2021-12-20 MED ORDER — METFORMIN HCL ER 500 MG PO TB24: 500.0000 mg | ORAL_TABLET | Freq: Every day | ORAL | 1 refills | Status: DC

## 2021-12-20 NOTE — Patient Instructions (Addendum)
From your psychiatrist:  ?-- Continue Lamictal '400mg'$  qhs for mood ?-- Continue Effexor 100 mg daily  ?-- Increase Wellbutrin XL 300 qAM  ?-- Continue Clonazepam 0.'25mg'$  at bedtime ?Last PDMP Review: 08/02/2021 2:57 PM ? ? ?[facility]  ?

## 2021-12-21 MED ORDER — GABAPENTIN 300 MG CAPSULE
ORAL_CAPSULE | Freq: Three times a day (TID) | ORAL | 5 refills | 60 days
Start: 2021-12-21 — End: 2022-02-19

## 2021-12-21 MED ORDER — CYCLOBENZAPRINE 5 MG TABLET
ORAL_TABLET | Freq: Two times a day (BID) | ORAL | 2 refills | 30 days | PRN
Start: 2021-12-21 — End: ?

## 2021-12-21 MED ORDER — DICLOFENAC 1 % TOPICAL GEL
Freq: Two times a day (BID) | TOPICAL | 5 refills | 75 days | PRN
Start: 2021-12-21 — End: ?
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
## 2021-12-22 ENCOUNTER — Ambulatory Visit: Admit: 2021-12-22 | Discharge: 2021-12-23 | Payer: MEDICARE

## 2021-12-22 DIAGNOSIS — M159 Polyosteoarthritis, unspecified: Principal | ICD-10-CM

## 2021-12-22 DIAGNOSIS — G8929 Other chronic pain: Principal | ICD-10-CM

## 2021-12-22 DIAGNOSIS — G6289 Other specified polyneuropathies: Principal | ICD-10-CM

## 2021-12-22 DIAGNOSIS — M47816 Spondylosis without myelopathy or radiculopathy, lumbar region: Principal | ICD-10-CM

## 2021-12-22 DIAGNOSIS — M542 Cervicalgia: Principal | ICD-10-CM

## 2021-12-22 DIAGNOSIS — G894 Chronic pain syndrome: Principal | ICD-10-CM

## 2021-12-22 DIAGNOSIS — M48061 Spinal stenosis, lumbar region without neurogenic claudication: Principal | ICD-10-CM

## 2021-12-22 DIAGNOSIS — M797 Fibromyalgia: Principal | ICD-10-CM

## 2021-12-22 DIAGNOSIS — Z7952 Long term (current) use of systemic steroids: Principal | ICD-10-CM

## 2021-12-22 DIAGNOSIS — M545 Chronic midline low back pain: Principal | ICD-10-CM

## 2021-12-22 MED ORDER — CYCLOBENZAPRINE 5 MG TABLET
ORAL_TABLET | Freq: Every evening | ORAL | 2 refills | 60 days | Status: CP | PRN
Start: 2021-12-22 — End: ?

## 2021-12-22 MED ORDER — DICLOFENAC 1 % TOPICAL GEL
Freq: Two times a day (BID) | TOPICAL | 5 refills | 75 days | Status: CP | PRN
Start: 2021-12-22 — End: ?

## 2021-12-22 MED ORDER — GABAPENTIN 300 MG CAPSULE
ORAL_CAPSULE | Freq: Three times a day (TID) | ORAL | 2 refills | 30 days | Status: CP
Start: 2021-12-22 — End: 2022-03-22

## 2021-12-22 NOTE — Unmapped (Signed)
It was a pleasure to see you today!    We refilled your gabapentin, start taking 300 mg (1 tablet) three times a day.   We refilled your flexeril, you can take 1-2 tablets nightly as need for muscle spasms.   We will schedule you for lumbar spine injections.

## 2021-12-22 NOTE — Unmapped (Signed)
I saw and evaluated the patient, participating in the key portions of the service.  I reviewed the resident’s note.  I agree with the resident’s findings and plan.

## 2021-12-23 NOTE — Unmapped (Signed)
[facility] Clinical Assessment & Refill Coordination Note    Caitlin Smith, DOB: [birth year]  Phone: [phone] (home)     All above HIPAA information was verified with patient.     Was a Nurse, learning disability used for this call? No    Specialty Medication(s):   Inflammatory Disorders: mycophenolate     Current Outpatient Medications   Medication Sig Dispense Refill    albuterol HFA 90 mcg/actuation inhaler Inhale 2 puffs every six (6) hours as needed for wheezing. 8 g 11    amLODIPine (NORVASC) 2.5 MG tablet Take 3 tablets (7.5 mg total) by mouth daily.      aspirin (ECOTRIN) 81 MG tablet Take 1 tablet (81 mg total) by mouth daily.      atenoloL (TENORMIN) 25 MG tablet Take 1 tablet (25 mg total) by mouth in the morning.      atorvastatin (LIPITOR) 40 MG tablet Take 1 tablet (40 mg total) by mouth daily.      benzonatate (TESSALON PERLES) 100 MG capsule Take 1 capsule (100 mg total) by mouth every six (6) hours as needed for cough. 30 capsule 1    biotin 1 mg cap Take by mouth.      brompheniramine-pseudoephedrine-DM 2-30-10 mg/5 mL syrup TAKE 5 ML BY MOUTH EVERY 6 HOURS AS NEEDED      buPROPion (WELLBUTRIN XL) 300 MG 24 hr tablet Take 1 tablet (300 mg total) by mouth daily. 90 tablet 1    calcipotriene (DOVONOX) 0.005 % ointment Apply topically daily. Use in skin folds and on the lower leg. 120 g 2    clobetasoL (TEMOVATE) 0.05 % ointment Apply topically Two (2) times a week. 30 g 3    clonazePAM (KLONOPIN) 0.5 MG tablet Take 0.5 tablets (0.25 mg total) by mouth nightly. 15 tablet 2    clotrimazole-betamethasone (LOTRISONE) 1-0.05 % cream Apply 1 application topically Two (2) times a day.      cyclobenzaprine (FLEXERIL) 5 MG tablet Take 1 tablet (5 mg total) by mouth nightly as needed for muscle spasms. 60 tablet 2    diclofenac sodium (VOLTAREN) 1 % gel Apply 2 g topically two (2) times a day as needed for arthritis. 300 g 5    dicyclomine (BENTYL) 20 mg tablet Take 1 tablet (20 mg total) by mouth every six (6) hours.      fluticasone propionate (FLONASE) 50 mcg/actuation nasal spray       fluticasone propionate (FLOVENT HFA) 220 mcg/actuation inhaler Inhale 1 puff Two (2) times a day. 12 g 11    gabapentin (NEURONTIN) 300 MG capsule Take 1 capsule (300 mg total) by mouth Three (3) times a day. 90 capsule 2    hydrALAZINE (APRESOLINE) 10 MG tablet       hydrOXYchloroQUINE (PLAQUENIL) 200 mg tablet Take 1 tablet (200 mg total) by mouth Two (2) times a day. 180 tablet 3    hydrOXYzine (ATARAX) 10 MG tablet       inhalational spacing device (AEROCHAMBER MV) Spcr 1 each by Miscellaneous route two (2) times a day. With Fovent 1 each 0    ketoconazole (NIZORAL) 2 % cream APP EXT AA D  0    lamoTRIgine (LAMICTAL) 200 MG tablet TAKE 1 TABLET BY MOUTH  TWICE DAILY 180 tablet 3    LINZESS 145 mcg capsule TAKE 1 CAPSULE(145 MCG) BY MOUTH DAILY 90 capsule 3    meclizine (ANTIVERT) 25 mg tablet Take 1 tablet (25 mg total) by mouth  Three (3) times a day as needed.      melatonin 10 mg Tab Take 1 tablet by mouth.      mupirocin (BACTROBAN) 2 % ointment APP EXT IEN BID  0    mycophenolate (MYFORTIC) 360 MG TbEC Take 2 tablets (720 mg total) by mouth two (2) times a day. 360 tablet 3    omeprazole (PRILOSEC) 40 MG capsule Take 1 capsule (40 mg total) by mouth daily. 90 capsule 3    ondansetron (ZOFRAN) 4 MG tablet       polyethylene glycol (GLYCOLAX) 17 gram/dose powder       pregabalin (LYRICA) 50 MG capsule       telmisartan (MICARDIS) 80 MG tablet Take 1 tablet (80 mg total) by mouth.      traZODone (DESYREL) 100 MG tablet TAKE 1 AND 1/2 TABLETS BY  MOUTH AT NIGHT 135 tablet 3    triamcinolone (KENALOG) 0.1 % ointment Apply twice a day to affected areas when red and itchy. Stop when improved and use a barrier cream. 80 g 1    venlafaxine (EFFEXOR) 100 MG tablet Take 1 tablet (100 mg total) by mouth in the morning. 30 tablet 11     No current facility-administered medications for this visit.        Changes to medications: [name] reports no changes at this time.    Allergies   Allergen Reactions    Vilazodone Swelling    Ace Inhibitors Other (See Comments)     Pt states she can not take ace inhibitors. Pt states she can not remember her reaction.     Bee Pollen Itching     COUGH, WATERY EYES    Penicillin G Rash    Penicillins Rash    Pollen Extracts Itching     COUGHING, WATERY EYES       Changes to allergies: No    SPECIALTY MEDICATION ADHERENCE     Mycophenolate 360 mg: 13 days of medicine on hand     Medication Adherence    Patient reported X missed doses in the last month: 0  Specialty Medication: mycophenolate 360mg   Patient is on additional specialty medications: No          Specialty medication(s) dose(s) confirmed: Regimen is correct and unchanged.     Are there any concerns with adherence? No    Adherence counseling provided? Not needed    CLINICAL MANAGEMENT AND INTERVENTION      Clinical Benefit Assessment:    Do you feel the medicine is effective or helping your condition? Yes    Clinical Benefit counseling provided? Not needed    Adverse Effects Assessment:    Are you experiencing any side effects? No    Are you experiencing difficulty administering your medicine? No    Quality of Life Assessment:    Quality of Life    Rheumatology  1. What impact has your specialty medication had on the reduction of your daily pain level?: Tremendous  2. What impact has your specialty medication had on your ability to complete daily tasks (prepare meals, get dressed, etc...)?Marland Kitchen Tremendous  Oncology  Dermatology  Cystic Fibrosis          How many days over the past month did your SLE  keep you from your normal activities? For example, brushing your teeth or getting up in the morning. 0    Have you discussed this with your provider? Not needed    Acute Infection Status:  Acute infections noted within Epic:  No active infections  Patient reported infection: None    Therapy Appropriateness:    Is therapy appropriate and patient progressing towards therapeutic goals? Yes, therapy is appropriate and should be continued    DISEASE/MEDICATION-SPECIFIC INFORMATION      N/A    PATIENT SPECIFIC NEEDS     Does the patient have any physical, cognitive, or cultural barriers? No    Is the patient high risk? Yes, patient is taking a REMS drug. Medication is dispensed in compliance with REMS program    Does the patient require a Care Management Plan? No     SOCIAL DETERMINANTS OF HEALTH     At the [facility], we have learned that life circumstances - like trouble affording food, housing, utilities, or transportation can affect the health of many of our patients.   That is why we wanted to ask: are you currently experiencing any life circumstances that are negatively impacting your health and/or quality of life? No    Social Determinants of Psychologist, prison and probation services Strain: Not on file   Internet Connectivity: Not on file   Food Insecurity: Not on file   Tobacco Use: Low Risk     Smoking Tobacco Use: Never    Smokeless Tobacco Use: Never    Passive Exposure: Not on file   Housing/Utilities: Not on file   Alcohol Use: Not on file   Transportation Needs: Not on file   Substance Use: Not on file   Health Literacy: Not on file   Physical Activity: Not on file   Interpersonal Safety: Not on file   Stress: Not on file   Intimate Partner Violence: Not on file   Depression: Not on file   Social Connections: Not on file       Would you be willing to receive help with any of the needs that you have identified today? Not applicable       SHIPPING     Specialty Medication(s) to be Shipped:   Inflammatory Disorders: mycophenolate    Other medication(s) to be shipped: No additional medications requested for fill at this time     Changes to insurance: No    Delivery Scheduled: Yes, Expected medication delivery date: 12/30/21.     Medication will be delivered via Next Day Courier to the confirmed prescription address in [facility].    The patient will receive a drug information handout for each medication shipped and additional FDA Medication Guides as required.  Verified that patient has previously received a Conservation officer, historic buildings and a Surveyor, mining.    The patient or caregiver noted above participated in the development of this care plan and knows that they can request review of or adjustments to the care plan at any time.      All of the patient's questions and concerns have been addressed.    Tera Helper   [facility] Specialty Pharmacist

## 2021-12-27 NOTE — Unmapped (Signed)
I interviewed the patient for 10 minutes via video link.  I discussed the case with the resident and agree with the assessment and plan as outlined in the note.     Orie Fisherman, MD

## 2021-12-28 NOTE — Unmapped (Unsigned)
Pulmonary Clinic - Follow-up Visit      HISTORY:     Active Pulmonary Problems & Brief History:  Caitlin Smith is a 66 y.o. woman with many medical issues here for follow up of follow up for organizing pneumonia.     Interval History:  - 2016 and prior - patient followed at [facility]. They thought her symptoms of dyspnea were from obesity and asthma and recommended ENT evaluation. They also stated concern for chronic aspiration.   - 04/2016 - patient in [facility]. She was thought to have multifactorial dyspnea, waxing and waning pulmonary opacities. Evaluated by pulmonary hypertension team without evidence of pulmonary hypertension. Underwent bronchoscopy with BAL and transbronchial biopsies which showed significant inflammation. DDX remained broad - CEP, COP, SLE pneumonitis, ABPA, EGPA, AEP, but it was decided to treat her with prolonged steroid taper.   - 08/23/16 - last visit with Dr. Johnnette Barrios - was improving with steroid taper   - 10/20/16 - established with Dr. Nilsa Nutting - there had been question of compliance with her prednisone since last visit, but by time of visit she had been on it consistently for one month. No clear improvement in dyspnea, but remains inactive.   - 02/09/17 - follow up - spirometry showed improvement in FVC and DLCOafter 2 months of steroid, had tapered off by time of visit. When she stopped the prednisone she developed increased cough, chest tightness, dyspnea.   - 03/27/17 - follow up - given steroid dependence was placed on MMF.   - 10/16/17 - follow up - dose reduced MMF for GI side effects. Prolonged steroid taper over this time.   - 02/06/2019 - last visit with pulmonary, Dr. Judeth Horn - at that time had gone to local ED for chest pain, CT chest showed interstitial lung disease in the lung bases, favored to reflect mild progression of chronic fibrotic nonspecific interstitial pneumonia (NSIP). It was unclear to Dr. Judeth Horn if that represented actual progression of her underlying disease.   - 03/03/21 - patient reports she has dyspnea on exertion not associated with chest pain. Not currently using any inhalers. Still has some chronic cough, but ENT thinks she's having reflux so has her on PPI + H2 blocker. Cough non-productive. No F/C/NS, N/V/D, upset tummy. Sleeps laying flat, no nocturnal awakenings. Reports negative sleep studies for OSA. No LE edema. Wanted to repeat PFTs in 4 months given mildly reduced compared to prior. If stable/improved at next visit then would reach out to rheum to see about weaning MMF.   - 06/08/21 - ENT - larynx healthy on laryngoscopy. Referred to GI for GI discomfort and constipation.   - 07/04/21 - follow up - had been increasing her exercise up to 50 minutes daily until 2 weeks prior to visit when breathing worsened again, now having dyspnea with minimal exertion. Sent for CT chest, , chest fluoroscopy. CT chest slightly worse but not significantly different, TTE without obvious pathology to cause symptoms. Chest fluoroscopy with elevated left hemidiaphragm and diminished excursion compared to right but not fully paralyzed, without hypoxemia.   - 09/13/20 - follow up - had URI over Christmas and New Year. Initially just given cough medicine (brompheniramine-pseudophed), then given course of doxycycline on 09/07/21. Now with lingering hoarseness, cough productive of green-yellow mucus. Treating with albuterol which is helpful and cough syrup which is not. No F/C, but is having some night sweats where she is soaking part of her shirt. Does have some dyspnea on exertion, probably not  that worse from prior to cold. Scared to get back to walking since she fell while walking. Was having dyspnea with walking but would ease up with continued exertion. No side effects with flovent. Elected to increase mycophenolate.     Past Medical History: The medical and surgical history were personally reviewed and updated in the patient's electronic medical record. Pertinent positives are documented above.  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 Pertinent positives are documented above.  Home Medications: Medications were reviewed and updated in the patient's electronic medical record. Pertinent positives are documented above.   Allergies: Allergies were reviewed and updated in the patient's electronic medical record. Pertinent positives are documented above.  Review of Systems: A comprehensive review of systems was completed and negative except as noted in HPI.    PHYSICAL EXAM:     There were no vitals filed for this visit.    General: pleasant individual appearing stated age, alert and oriented, no acute distress  HEENT: trachea midline, supple  CV: RRR, no m/r/g  Lungs: crackles in BL bases, good air movement throughout   Abd: Soft, NT/ND, no rebound or guarding  Ext: Warm, well perfused, no peripheral edema  Skin: No rashes, skin breakdown, or wounds  Neuro: CN II-XII intact to conversation, alert and oriented.     LABORATORY and RADIOLOGY DATA:     Pulmonary Function Tests/Interpretation:  07/05/21      No obstruction. Normal inspiratory flow volume loops. Moderate restriction. Moderate reduction in DLCO. DLCO reduced from prior, FEV1 and FVC unchanged.     08/11/21      Pertinent Laboratory Data:  04/25/16 BAL and TBBX   Cultures negative except for OPF     Cell count  PMN 31%  Lymph 11%  Macs 43%  Eos 7%  Baso 6%     Pathology   A: Lung, right lower lobe, endobronchial biopsy   - DIP-like 31%  Lymph 11%  Macs 43%  Eos 7%  Baso 6%     Pathology   A: Lung, right lower lobe, endobronchial biopsy   - DIP-like reaction pattern with admixed eosinophils, consistent with eosinophilic pneumonia (see Micro Exam)  - Patchy organizing pneumonia  - Interstitial lymphoplasmacytic infiltrate, polytypic  - Partially denuded benign respiratory mucosa, without viral cytopathic effect  - No malignant neoplasm identified   - GMS and  AFB stains negative for fungi or AFB    Cytology  Lung, Bronchial lavage:  - No malignant cells identified.   - Alveolar macrophages and mixed inflammation.     Pertinent Imaging Data:  08/02/21 HRCT Chest  Slight worsening of fibrotic changes related to  interstitial lung disease associated with connective tissue disease.       Resolution of right lower lobe consolidation with central clearance consistent with organizing pneumonia.       Pertinent Cardiac Data:  04/14/16 TTE   Left ventricular hypertrophy - mild  Normal left ventricular systolic function, ejection fraction  55 to 60%  Diastolic dysfunction - grade I (normal filling pressures)  Normal right ventricular systolic function      ASSESSMENT and PLAN     Problem List  Connective Tissue Disease related Interstitial Lung Disease (CTD-ILD)  Cryptogenic Organizing Pneumonia (COP)  SLE   GERD  BMI 37   Left hemidiaphragm elevation    Assessment  Worsened symptoms and worsened PFTs over past several months. CT had worsened prior.. Worried this represents progression of her CTD-ILD. For this reason will continue mycophenolate 720mg  BID which was recently increased, defer to HCQ per rheumatology, and start prednisone with taper weekly from 40mg  daily to 10mg  daily where we will maintain to repeat PFTs in about 4 weeks. Pulmonary rehab given deconditioning contributing, encouraged exercise.     Plan  - Continue mycophenolate 720mg  BID, HCQ per rheumatology  - Prednisone taper 40mg  x7 days, 30mg  x7 days, 20mg  x7 days, 10mg  indefinitely   - reticulation at the lung bases.  No focal air-trapping or honeycombing.  No pleural effusion.    --Increase in lower lobe predominant patchy multifocal consolidations. Favor organizing pneumonia versus eosinophilic pneumonia.  --Similar appearance of bibasilar subpleural articulation, likely mild fibrosis.        Pertinent Cardiac Data:  04/14/16 TTE   ?? Left ventricular hypertrophy - mild  ?? Normal left ventricular systolic function, ejection fraction 55 to 60%  ?? Diastolic dysfunction - grade I (normal filling pressures)  ?? Normal right ventricular systolic function      ASSESSMENT and PLAN     Problem List  Connective Tissue Disease related Interstitial Lung Disease (CTD-ILD)  Cryptogenic Organizing Pneumonia (COP)  SLE   GERD  BMI 37   Left hemidiaphragm elevation    Assessment  Patient with what sounds like stable symptoms over past few months with picture muddied by fall preventing her from walking and then respiratory infection with ongoing symptoms. For now will work on increasing exertion again, continuing flovent for BOS picture, omeprazole and HOB elevation for GERD, sputum culture for continued sputum production. Will discuss at ILD conference and decide on prednisone vs not, increasing mycophenolate. PFT in 3 months at next visit.     Plan  - ILD conference  - Continue omeprazole for GERD, encouraged sleeping with HOB elevation  - Continue inhaled fluticasone for BOS  - Sputum culture  - PFT in 3 months     The patient was seen with Dr. Linward Headland and will return to clinic in 3 months.    Layla Maw MD  Pulmonary and Critical Care Fellow

## 2021-12-29 ENCOUNTER — Ambulatory Visit: Admit: 2021-12-29 | Discharge: 2021-12-30 | Payer: MEDICARE

## 2021-12-29 DIAGNOSIS — J984 Other disorders of lung: Principal | ICD-10-CM

## 2021-12-29 DIAGNOSIS — J8489 Other specified interstitial pulmonary diseases: Principal | ICD-10-CM

## 2021-12-29 DIAGNOSIS — M359 Systemic involvement of connective tissue, unspecified: Principal | ICD-10-CM

## 2021-12-29 DIAGNOSIS — J8410 Pulmonary fibrosis, unspecified: Principal | ICD-10-CM

## 2021-12-29 MED ORDER — PREDNISONE 10 MG TABLET
ORAL_TABLET | ORAL | 0 refills | 56 days | Status: CP
Start: 2021-12-29 — End: 2022-02-23

## 2021-12-29 MED FILL — MYCOPHENOLATE SODIUM 360 MG TABLET,DELAYED RELEASE: ORAL | 90 days supply | Qty: 360 | Fill #1

## 2021-12-29 NOTE — Unmapped (Signed)
Hi Caitlin Smith,    It was great meeting you today. Here is what we talked about.    1) Connective Tissue Disease related Interstitial Lung Disease (CTD-ILD)  - This is a form of pulmonary fibrosis where your autoimmune disease attacks your lungs.  - We have you on two anti-inflammatory medications right now - mycophenolate and hydroxychloroquine.   - Let's add a third anti-inflammatory medication - prednisone - 40mg  daily for one week, 30mg  daily for one week, 20mg  daily for one week, then 10mg  daily until you hear from me.   - We will repeat breathing tests in 4 weeks   - Otherwise, let's work on exercise. I am referring you the [facility] for Pulmonary Rehabilitation to get you stronger. As this tapers down I want you to start going to the gym on your own to work on strengthening muscles to help breathing.     [facility] [address] [place], [place] [address] [phone]    Please return to clinic in 2 months.     If you have any non-urgent questions or concerns please reach out by calling the clinic or via MyChart.    Thanks,  Layla Maw MD  Pulmonary and Critical Care Fellow

## 2021-12-31 NOTE — Unmapped (Signed)
I saw and evaluated the patient, participating in the key portions of the service.  I reviewed the resident???s note.  I agree with the resident???s findings and plan. Pablo Ledger, MD

## 2022-01-01 MED ORDER — CYCLOBENZAPRINE 5 MG TABLET
ORAL_TABLET | 2 refills | 0 days
Start: 2022-01-01 — End: ?

## 2022-01-04 NOTE — Unmapped (Addendum)
Patient left a voicemail stating they were supposed to get PFTs done at [facility] but they prefer to get them done at [facility].  States they would like to get PFTs done at [facility] and then see provider on the same day.  Also states they are unsure about when their next appointment is.    Called patient back, no answer.  Unable to leave voicemail due to mailbox being full.    Patient called back.  Informed patient that PFT appointment was to happen four weeks after 12/29/21 appointment with provider.  Also informed patient that follow up appointment with provider is on 04/20/2022.  Patient states they will go ahead and schedule PFT appointment at [facility] location.  Patient also asks about pulmonary rehab.  Informed patient that referral was sent to [facility].  Also informed patient that phone number for pulm rehab will be sent via MyChart message.  Patient verbalized understanding.    MyChart message with [facility] sent to patient.

## 2022-01-05 ENCOUNTER — Other Ambulatory Visit: Payer: Self-pay | Admitting: Family Medicine

## 2022-01-05 NOTE — Telephone Encounter (Signed)
Patient called back stated that she want her medications sent to [facility]  ?

## 2022-01-10 IMAGING — CR DG CHEST 2V
2 series · 2 of 2 positions shown · non-contrast
Comparison: April 28, 2015

CLINICAL DATA: Chest pain

EXAM:
CHEST - 2 VIEW

[chest pa]
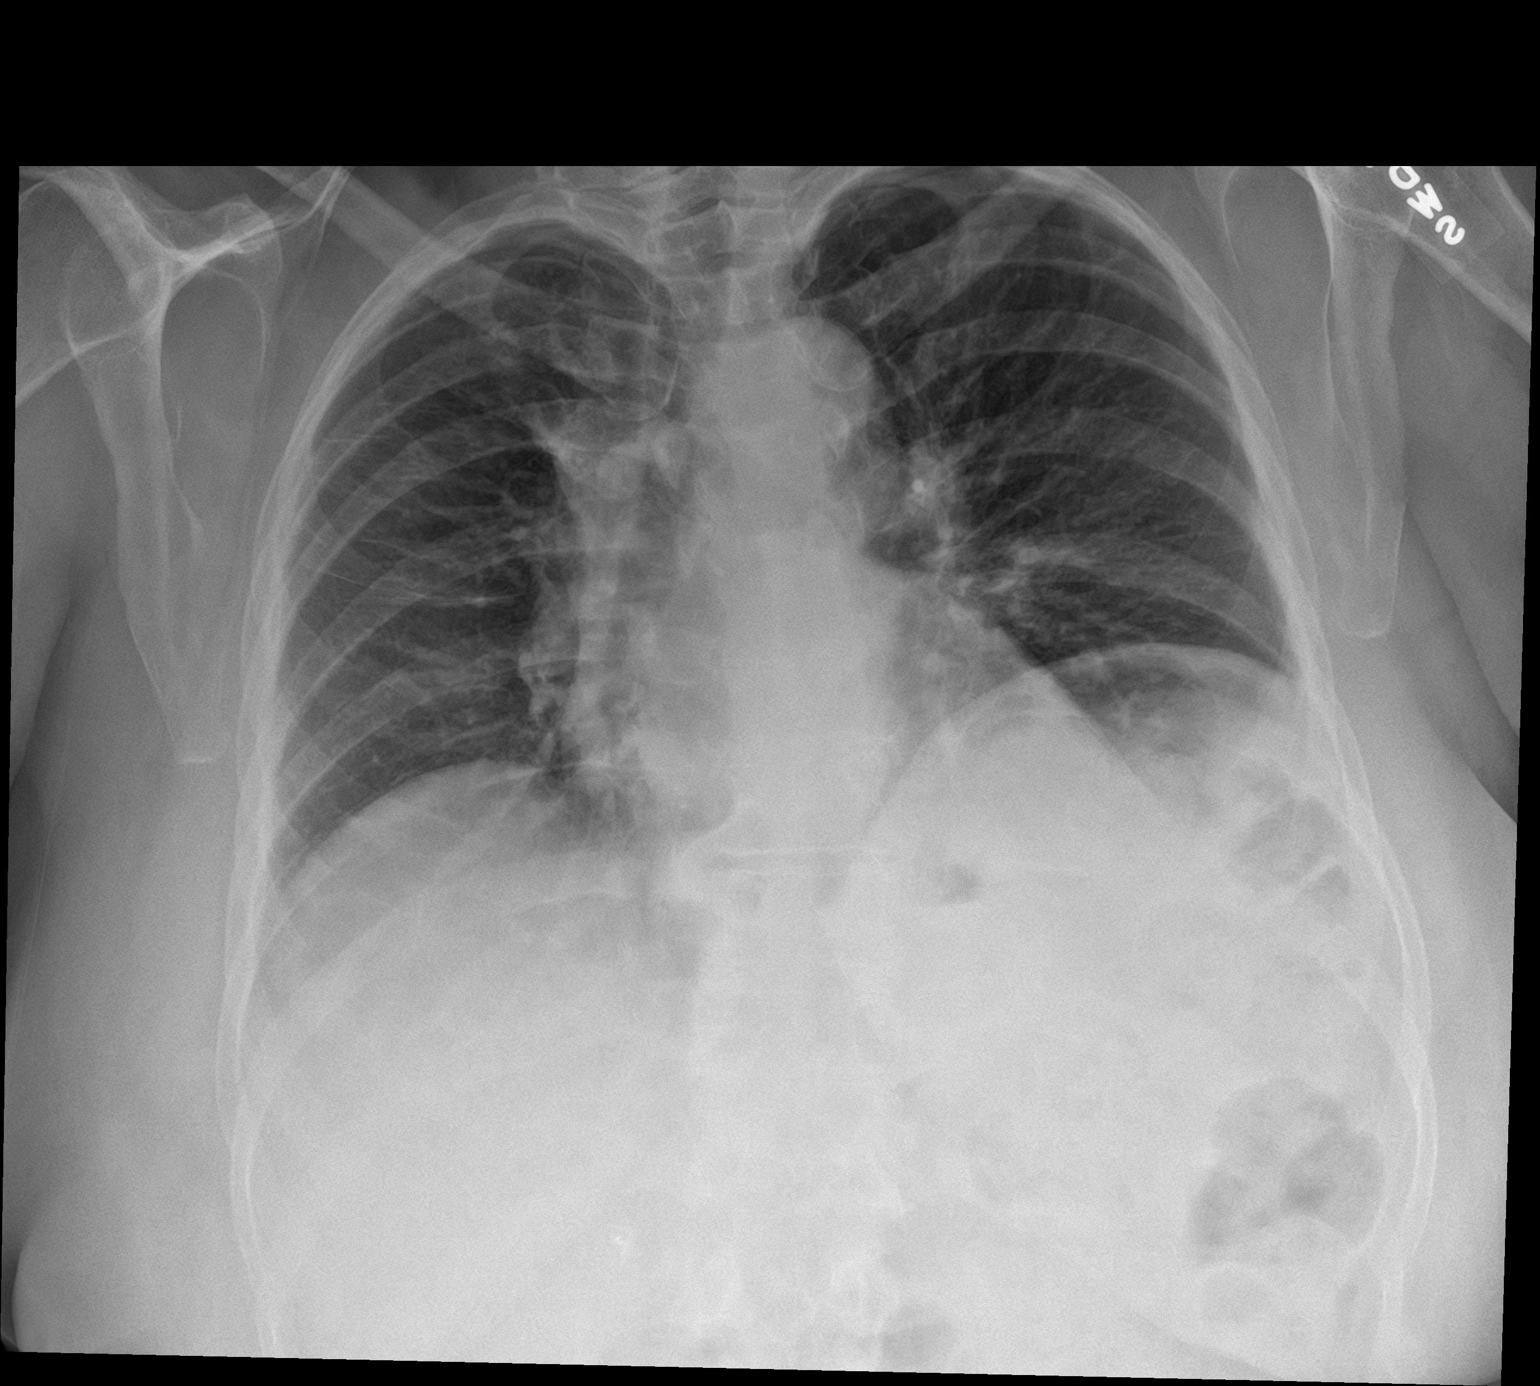

[chest lat]
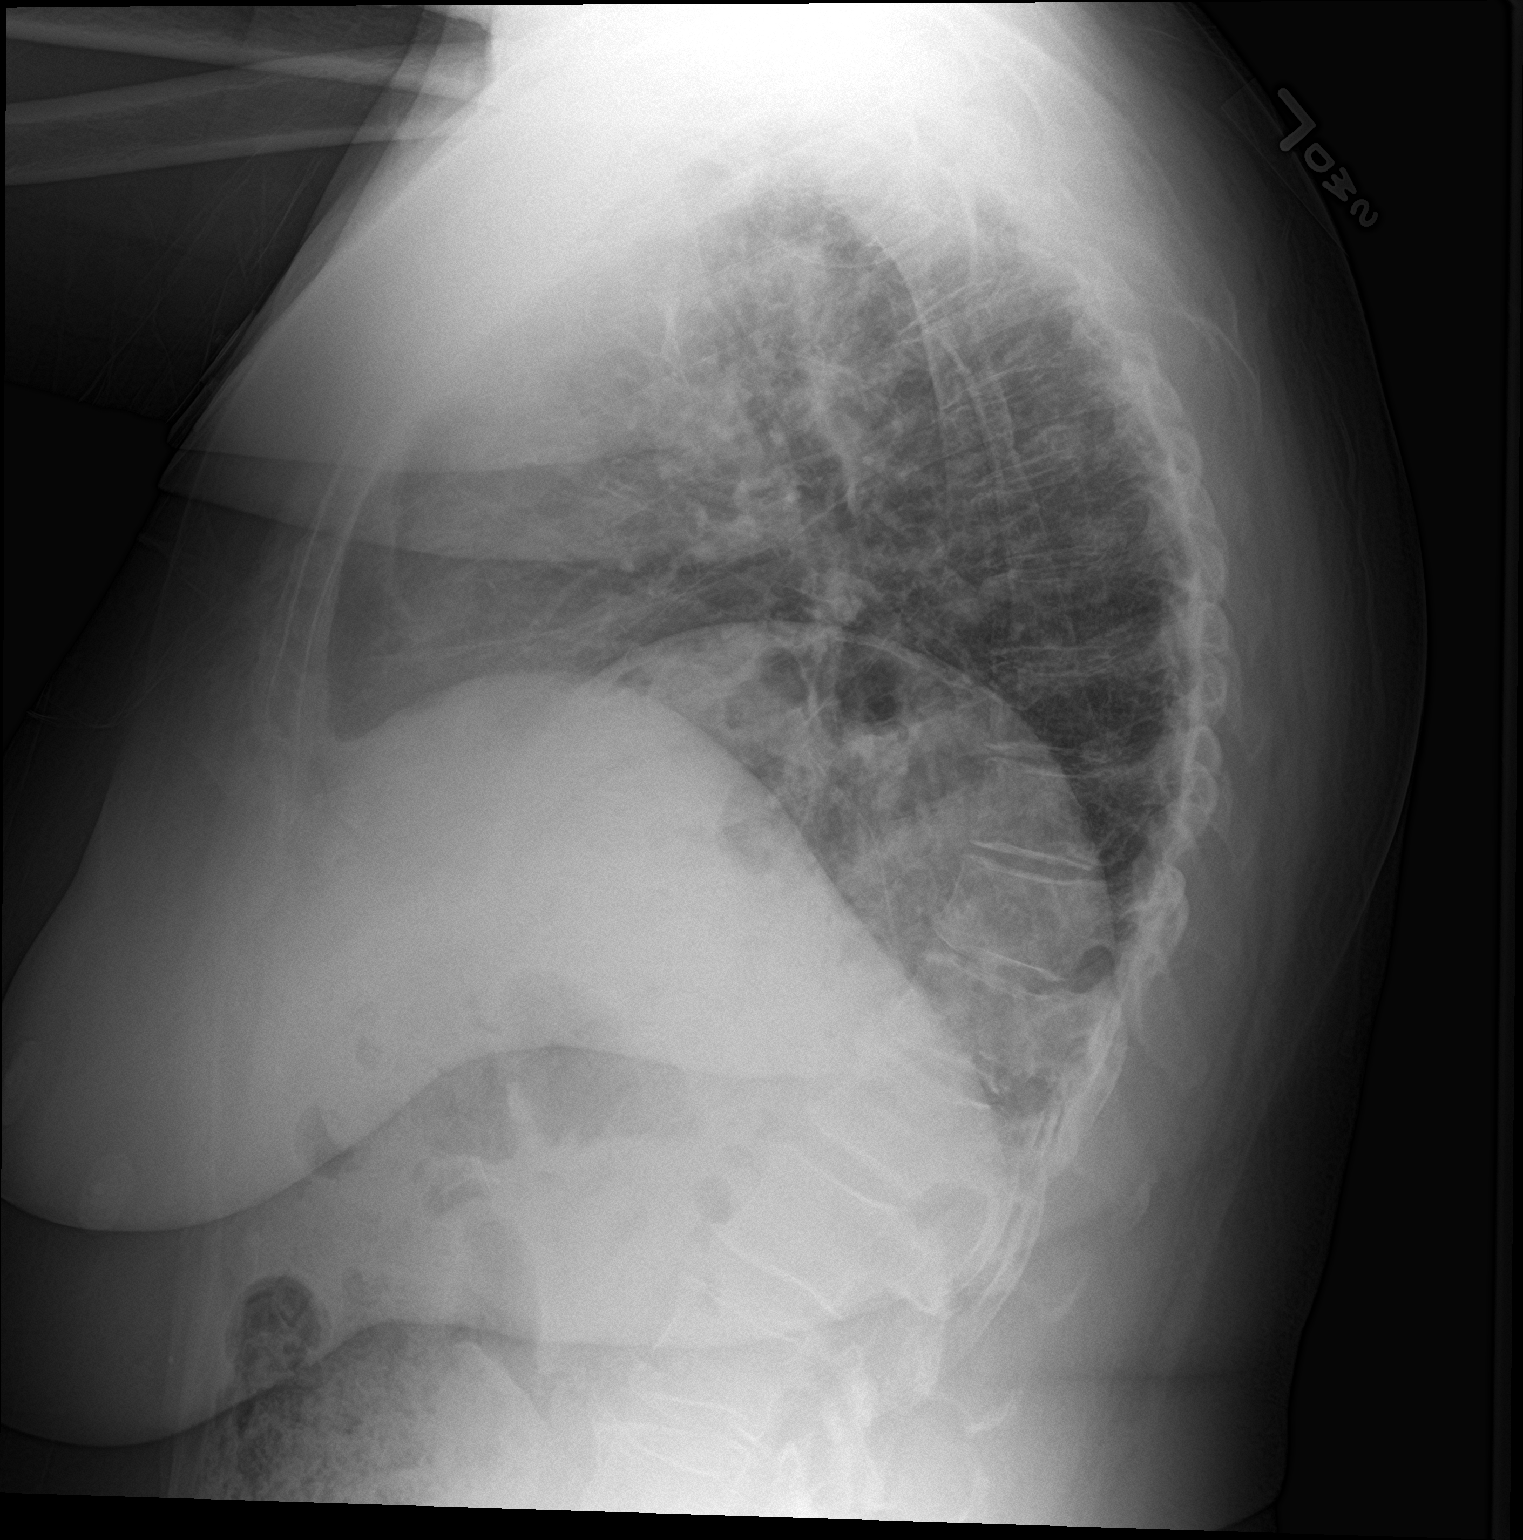

[2 of 2 positions shown; findings below may reference images not displayed]

FINDINGS: Degree of inspiration shallow. There is elevation of the left
hemidiaphragm. Areas of mild atelectatic change on the right. No
edema or airspace opacity. Heart size and pulmonary vascularity are
normal. No adenopathy. No bone lesions.
IMPRESSION: Shallow degree of inspiration. Elevation left hemidiaphragm. Mild
atelectasis on the right. No edema or airspace opacity. Heart size
normal.

## 2022-01-11 NOTE — Unmapped (Signed)
Caitlin Smith from [facility] rehab called and stated that the fax sent through did not have all the needed information and to resend to [phone]. Re-faxed information to new fax number as per her request. Fax confirmation received.

## 2022-01-12 DIAGNOSIS — M329 Systemic lupus erythematosus, unspecified: Principal | ICD-10-CM

## 2022-01-12 MED ORDER — HYDROXYCHLOROQUINE 200 MG TABLET
ORAL_TABLET | 3 refills | 0 days | Status: CP
Start: 2022-01-12 — End: ?

## 2022-01-12 NOTE — Unmapped (Signed)
HCQ refill  Last ov: 09/30/2021  Next ov: 02/01/2022

## 2022-01-26 MED ORDER — CLONAZEPAM 0.5 MG TABLET
ORAL_TABLET | Freq: Every evening | ORAL | 2 refills | 30 days | Status: CP
Start: 2022-01-26 — End: ?

## 2022-01-26 NOTE — Unmapped (Signed)
Patient called requesting a refill request.

## 2022-01-28 ENCOUNTER — Institutional Professional Consult (permissible substitution): Admit: 2022-01-28 | Discharge: 2022-01-29 | Payer: MEDICARE | Attending: Psychologist | Primary: Psychologist

## 2022-01-28 DIAGNOSIS — F331 Major depressive disorder, recurrent, moderate: Principal | ICD-10-CM

## 2022-01-28 DIAGNOSIS — F411 Generalized anxiety disorder: Principal | ICD-10-CM

## 2022-01-28 DIAGNOSIS — F4310 Post-traumatic stress disorder, unspecified: Principal | ICD-10-CM

## 2022-01-28 DIAGNOSIS — F410 Panic disorder [episodic paroxysmal anxiety] without agoraphobia: Principal | ICD-10-CM

## 2022-01-28 NOTE — Unmapped (Signed)
[facility]   Confidential Psychological Therapy Session      Patient Name: Caitlin Smith  Medical Record Number: [id number]  Date of Service: Jan 28, 2022  Attending Psychologist: Caroline More, PhD  CPT Procedure Codes: 95621 for 60 mins of face to face counseling    This visit was performed face to face with interactive technology using a HIPPA compliant audio/visual platform. We reviewed confidentiality today. The patient was present in [place], a state in which this provider is licensed and able to provide care (location and contact information confirmed), attended this visit alone, and consented to this virtual pain psychology visit.    REFERRING PHYSICIAN: Clarene Essex, MD    CHIEF COMPLAINT AND REASON FOR VISIT: pain coping skills, CBT to address depression and anxiety in the setting of chronic pain    SUBJECTIVE / HISTORY OF PRESENT ILLNESS: Ms.  Caitlin Smith is a very pleasant 66 y.o.  female from [place], [place] with multiple chronic pain complaints related to fibromyalgia and lupus who initially met with me in October 2016, and which time she was diagnosed with severe depression, PTSD, panic disorder, and generalized anxiety.The patient returns for a therapy session today. Last follow up with me was 12/17/21.     Patient reports mood to be fair, denies any SI, and notes feeling anxious about repeat LFT showing poorer pulmonary functioning. Counseled patient on lung functioning, lupus, and ways to optimize lung functioning in the short and long term. Pt getting up and walking at 6:30am every morning. Has lost weight and notices she feels physically stronger. Asks questions about how to improve lung functioning. Will be starting pulm rehab soon and is enthusiastic about this. Spent extra time reviewing mind body connectedness, how to use this in addressing acceptance to lung damage and optimizing lung functioning and health moving forward.      Pt commended for continuing beh activation coping. Reviewed mind and body benefits. She has been using ARC and pacing strategies to not overdo it.       OBJECTIVE / MENTAL STATUS:    Appearance:   Appears stated age and Clean/Neat   Motor:  No abnormal movements   Speech/Language:   Normal rate, volume, tone, fluency   Mood:  Depressed and Anxious   Affect:  euthymic   Thought process:  Logical, linear, clear, coherent, goal directed   Thought content:    Denies SI, HI, self harm, delusions, obsessions, paranoid ideation, or ideas of reference   Perceptual disturbances:    Denies auditory and visual hallucinations, behavior not concerning for response to internal stimuli   Orientation:  Oriented to person, place, time, and general circumstances   Attention:  Able to fully attend without fluctuations in consciousness   Concentration:  Able to fully concentrate and attend   Memory:  Immediate, short-term, long-term, and recall grossly intact    Fund of knowledge:   Consistent with level of education and development   Insight:    Fair   Judgment:   Intact   Impulse Control:  Intact     DIAGNOSTIC IMPRESSION:   Post Traumatic Stress Disorder (PTSD)  Generalized Anxiety Disorder (GAD)  Panic Disorder  Major Depressive Disorder, moderate, recurrent  Chronic pain syndrome  Fibromyalgia  Lupus    ASSESSMENT:   Ms.  Caitlin Smith is a very pleasant 66 y.o.  female from [place], [place] with multiple chronic pain complaints related to lupus, arthritis, and fibromyalgia. She was previously seen in our clinic  from 2012-2014, and reestablished care with Dr. Fayrene Fearing in August 2016. The patient has struggled with depression and anxiety, but is very motivated to participate in intensive, multidisciplinary treatment to address the connection between depression, anxiety and pain. She has a long-standing history of depression and anxiety, and has worked with outpatient psychiatrist and therapist over the years. She is currently established with [facility] psychiatry. In general, depression, anxiety, pain coping and panic have all improved, but the patient evidences a seasonal affective pattern of mood changes typically her mood tends to be worse during the winter.     PLAN:   (1) Psychotherapy - Continue CBT. CBT will be used to address PTSD, MDD, Panic D/o, GAD, and chronic pain.     --Behavioral Activation - Encouraged behavioral activation.  Is trying.  --Downloaded and uses Insight Timer, guided relaxation app, for nightly practice.  --Continues to utilize diaphragmatic breathing daily  --encouraged early morning sunlight / light therapy for possible SAD in the setting of chronic MDD    (2) Psychiatry - Pt currently established with [facility]. Has established with a new resident and is followed by Dr. Evette Cristal (attending)    (3) Safety - Pt denies any current or recent SI or safety concerns. Knows to call 911 or go to her local ED. Also previously given [facility].      (4) follow-up - with me in 1 month

## 2022-02-02 ENCOUNTER — Encounter: Payer: Medicare Other | Attending: Critical Care Medicine | Admitting: *Deleted

## 2022-02-02 ENCOUNTER — Encounter: Payer: Self-pay | Admitting: *Deleted

## 2022-02-02 DIAGNOSIS — J849 Interstitial pulmonary disease, unspecified: Secondary | ICD-10-CM

## 2022-02-02 NOTE — Progress Notes (Signed)
Virtual orientation call completed today. shehas an appointment on Date: 02/09/2022  for EP eval and gym Orientation.  Documentation of diagnosis can be found in [facility] Date: 12/29/2021 .

## 2022-02-08 ENCOUNTER — Ambulatory Visit: Admit: 2022-02-08 | Discharge: 2022-02-09 | Payer: MEDICARE

## 2022-02-08 DIAGNOSIS — M810 Age-related osteoporosis without current pathological fracture: Principal | ICD-10-CM

## 2022-02-08 DIAGNOSIS — Z79899 Other long term (current) drug therapy: Principal | ICD-10-CM

## 2022-02-08 DIAGNOSIS — E559 Vitamin D deficiency, unspecified: Principal | ICD-10-CM

## 2022-02-08 DIAGNOSIS — L408 Other psoriasis: Principal | ICD-10-CM

## 2022-02-08 DIAGNOSIS — M329 Systemic lupus erythematosus, unspecified: Principal | ICD-10-CM

## 2022-02-08 LAB — BUN: BLOOD UREA NITROGEN: 12 mg/dL (ref 9–23)

## 2022-02-08 LAB — CBC W/ AUTO DIFF
BASOPHILS ABSOLUTE COUNT: 0.1 10*9/L (ref 0.0–0.1)
BASOPHILS RELATIVE PERCENT: 1.2 %
EOSINOPHILS ABSOLUTE COUNT: 0.2 10*9/L (ref 0.0–0.5)
EOSINOPHILS RELATIVE PERCENT: 1.6 %
HEMATOCRIT: 42.2 % (ref 34.0–44.0)
HEMOGLOBIN: 13.4 g/dL (ref 11.3–14.9)
LYMPHOCYTES ABSOLUTE COUNT: 3.8 10*9/L — ABNORMAL HIGH (ref 1.1–3.6)
LYMPHOCYTES RELATIVE PERCENT: 31.4 %
MEAN CORPUSCULAR HEMOGLOBIN CONC: 31.8 g/dL — ABNORMAL LOW (ref 32.0–36.0)
MEAN CORPUSCULAR HEMOGLOBIN: 27.1 pg (ref 25.9–32.4)
MEAN CORPUSCULAR VOLUME: 85.2 fL (ref 77.6–95.7)
MEAN PLATELET VOLUME: 7.7 fL (ref 6.8–10.7)
MONOCYTES ABSOLUTE COUNT: 0.8 10*9/L (ref 0.3–0.8)
MONOCYTES RELATIVE PERCENT: 6.7 %
NEUTROPHILS ABSOLUTE COUNT: 7.1 10*9/L (ref 1.8–7.8)
NEUTROPHILS RELATIVE PERCENT: 59.1 %
PLATELET COUNT: 338 10*9/L (ref 150–450)
RED BLOOD CELL COUNT: 4.96 10*12/L (ref 3.95–5.13)
RED CELL DISTRIBUTION WIDTH: 15.1 % (ref 12.2–15.2)
WBC ADJUSTED: 12.1 10*9/L — ABNORMAL HIGH (ref 3.6–11.2)

## 2022-02-08 LAB — URINALYSIS WITH MICROSCOPY WITH CULTURE REFLEX
BILIRUBIN UA: NEGATIVE
BLOOD UA: NEGATIVE
GLUCOSE UA: NEGATIVE
KETONES UA: NEGATIVE
LEUKOCYTE ESTERASE UA: NEGATIVE
NITRITE UA: NEGATIVE
PH UA: 5.5 (ref 5.0–9.0)
PROTEIN UA: NEGATIVE
RBC UA: 2 /HPF (ref 0–3)
SPECIFIC GRAVITY UA: >=1.03 (ref 1.005–1.030)
SQUAMOUS EPITHELIAL: <1 /HPF (ref 0–5)
UROBILINOGEN UA: 1
WBC UA: 3 /HPF (ref 0–3)

## 2022-02-08 LAB — CALCIUM: CALCIUM: 9.4 mg/dL (ref 8.7–10.4)

## 2022-02-08 LAB — PROTEIN / CREATININE RATIO, URINE
CREATININE, URINE: 238 mg/dL
PROTEIN URINE: 41.1 mg/dL
PROTEIN/CREAT RATIO, URINE: 0.173

## 2022-02-08 LAB — CREATININE
CREATININE: 0.9 mg/dL — ABNORMAL HIGH
EGFR CKD-EPI (2021) FEMALE: 72 mL/min/{1.73_m2} (ref >=60)

## 2022-02-08 LAB — ALBUMIN: ALBUMIN: 3.7 g/dL (ref 3.4–5.0)

## 2022-02-08 LAB — C3 COMPLEMENT: C3 COMPLEMENT: 129 mg/dL (ref 90–170)

## 2022-02-08 LAB — ALT: ALT (SGPT): 14 U/L (ref 10–49)

## 2022-02-08 LAB — C4 COMPLEMENT: C4 COMPLEMENT: 25.5 mg/dL (ref 12.0–36.0)

## 2022-02-08 LAB — SLIDE REVIEW

## 2022-02-08 NOTE — Unmapped (Signed)
Chief Compliant: SLE, OA, fibromyalgia follow-up      HPI: Caitlin Smith is a 66 y.o. female who is here today for follow-up for SLE, OA, and fibromyalgia.  Initially diagnosed in 2006 with SLE presenting with episcleritis and arthralgia with serology showing +ANA, +dsDNA.  She also has stage I CKD felt secondary not due to SLE but due to HTN and obesity.   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  Treatment history:  - HCQ  - Previously treated with prednisone for eosinophilic pneumonitis with difficulty tapering due to pulm symptoms, so cellcept was added 2018.   - Cellcept changed to myfortic 2019 due to GI distress.   - tapered off prednisone in late 2019  - Derm eval in 07/2018 consistent with inverse psoriasis, possibly due to prednisone use. Recommended avoiding systemic steroids when possible.   - Myfortic increased to 720 mg BID in 09/2021 due to worsening ILD.   - Prednisone (to be taken indefinitely) resumed by pulmonology in 12/2021.  - Current med regimen: Myfortic 720 mg twice daily, Plaquenil 200 mg twice daily, Prednisone 10 mg qd (per pulm).  Also followed by pain clinic for pharmacotherapy for fibromyalgia.     Interim history:  Presents today for f/u.     She continues close f/u with pulmonology for her dyspnea which has been attributed to worsening ILD. She has not noted any improvement in DOE with increase in myfortic to 720 mg BID, but she is tolerating this dose well. Per notes from interim pulm visit, CT and PFTs worsened. They recommended starting prednisone taper from 40 to 10 mg, with plan for her to remain on 10 mg indefinitely. She does not feel any differently with respect to her breathing since starting prednisone.   Pt is concerned about her lungs. She has been referred to pulmonary rehab, which she will start later this week. She has also been walking for exercise 5 days weekly.     She is so focused on her breathing that she is not sure if she has had other issues.   No pain today. Has been having acid reflux, burning in the chest. Has had similar sx in the past and was started on omeprazole with relief. This morning she had the burning in the chest and throat again, like she has had in the past, so she wonders if the omeprazole is not working as well. Before this morning, last similar episode was in 2022.     No fevers or chills, no wt loss. Has gained weight on steroids.     Skin has been doing fine with no flaring psoriasis.     ROS??: Attests to the above, otherwise, review of ten system is negative.  ??  Past Medical and Surgical History:  ??  Patient Active Problem List    Diagnosis Date Noted    Immunocompromised (CMS-HCC) 04/29/2021    Otalgia 12/03/2019    Hyperglycemia 10/23/2017    Bronchiolitis obliterans organizing pneumonia (CMS-HCC) 09/29/2016    Vertigo 09/29/2016    Neck pain 12/15/2015    Fibromyalgia 10/12/2015    Chronic constipation 04/11/2015    Chronic recurrent major depressive disorder (CMS-HCC) 04/11/2015    Dyslipidemia 04/11/2015    Obesity, diabetes, and hypertension syndrome (CMS-HCC) 04/11/2015    Intertrigo 04/11/2015    Fibrositis 04/11/2015    Gastroesophageal reflux disease without esophagitis 04/11/2015    Vitamin D deficiency 04/11/2015  Seborrhea capitis 04/11/2015    Perennial allergic rhinitis with seasonal variation 04/11/2015    Moderate dysplasia of vulva 04/11/2015    Spinal stenosis of lumbar region 04/11/2015    Keratosis pilaris 04/11/2015    Insomnia, persistent 04/11/2015    Auditory impairment 04/11/2015    Eczema intertrigo 04/11/2015    Neurogenic claudication 04/11/2015    Screening for cardiovascular condition 03/05/2015    Encounter for screening for cardiovascular disorders 03/05/2015    Treadmill stress test negative for angina pectoris 03/05/2015    Cough 05/20/2014    Shortness of breath 05/20/2014    Regurgitation 04/24/2014 Epigastric burning sensation 04/24/2014    Obesity with body mass index 30 or greater 02/03/2014    S/P total knee replacement 12/31/2013    Asthma 12/31/2013    Asthma, chronic 12/31/2013    H/O total knee replacement 12/31/2013    SLE (systemic lupus erythematosus) (CMS-HCC) 11/28/2013    Episcleritis 09/26/2013    Other diseases of vocal cords 05/28/2013    Vocal cord dysfunction 05/28/2013    Generalized anxiety disorder 03/19/2013    Pain medication agreement signed 02/12/2013    Family history of breast cancer     VIN II (vulvar intraepithelial neoplasia II) 01/25/2013    Back pain 12/18/2012    Multiple pulmonary nodules 11/26/2012    Lung nodule, multiple 11/26/2012    Pulmonary nodules 11/26/2012    Osteoarthrosis 11/01/2012    Osteoarthritis 11/01/2012    Arthritis, degenerative 11/01/2012    Postinflammatory pulmonary fibrosis (CMS-HCC) 10/22/2012    Aspiration pneumonitis (CMS-HCC) 10/22/2012    H/O aspiration pneumonitis 10/22/2012    Mixed urge and stress incontinence 05/04/2012    Dysphonia 03/22/2012    Sensorineural hearing loss 03/22/2012    Myalgia and myositis 02/25/2011    Benign neoplasm of pituitary gland and craniopharyngeal duct (pouch) (CMS-HCC) 12/29/2010    Chronic kidney disease, stage I 12/08/2010    Proteinuria 12/08/2010    Lung involvement in systemic lupus erythematosus (CMS-HCC) 11/05/2010    Chronic nausea 07/01/2008    Hypertension, benign 01/25/2005     Allergies:   ??  Allergies   Allergen Reactions    Vilazodone Swelling    Ace Inhibitors Other (See Comments)     Pt states she can not take ace inhibitors. Pt states she can not remember her reaction.     Bee Pollen Itching     COUGH, WATERY EYES    Penicillin G Rash    Penicillins Rash    Pollen Extracts Itching     COUGHING, WATERY EYES     Current Outpatient Medications:  ??  Current Outpatient Medications on File Prior to Visit   Medication Sig Dispense Refill    albuterol HFA 90 mcg/actuation inhaler Inhale 2 puffs every six (6) hours as needed for wheezing. 8 g 11    amLODIPine (NORVASC) 2.5 MG tablet Take 3 tablets (7.5 mg total) by mouth daily.      aspirin (ECOTRIN) 81 MG tablet Take 1 tablet (81 mg total) by mouth daily.      atenoloL (TENORMIN) 25 MG tablet Take 1 tablet (25 mg total) by mouth in the morning.      atorvastatin (LIPITOR) 40 MG tablet Take 1 tablet (40 mg total) by mouth daily.      benzonatate (TESSALON PERLES) 100 MG capsule Take 1 capsule (100 mg total) by mouth every six (6) hours as needed for cough. 30 capsule 1    biotin 1 mg cap  Take by mouth.      brompheniramine-pseudoephedrine-DM 2-30-10 mg/5 mL syrup TAKE 5 ML BY MOUTH EVERY 6 HOURS AS NEEDED      buPROPion (WELLBUTRIN XL) 300 MG 24 hr tablet Take 1 tablet (300 mg total) by mouth daily. 90 tablet 1    calcipotriene (DOVONOX) 0.005 % ointment Apply topically daily. Use in skin folds and on the lower leg. 120 g 2    clobetasoL (TEMOVATE) 0.05 % ointment Apply topically Two (2) times a week. 30 g 3    clonazePAM (KLONOPIN) 0.5 MG tablet Take 0.5 tablets (0.25 mg total) by mouth nightly. 15 tablet 2    clotrimazole-betamethasone (LOTRISONE) 1-0.05 % cream Apply 1 application. topically Two (2) times a day.      cyclobenzaprine (FLEXERIL) 5 MG tablet Take 1 tablet (5 mg total) by mouth nightly as needed for muscle spasms. 60 tablet 2    diclofenac sodium (VOLTAREN) 1 % gel Apply 2 g topically two (2) times a day as needed for arthritis. 300 g 5    dicyclomine (BENTYL) 20 mg tablet Take 1 tablet (20 mg total) by mouth every six (6) hours.      fluticasone propionate (FLONASE) 50 mcg/actuation nasal spray       fluticasone propionate (FLOVENT HFA) 220 mcg/actuation inhaler Inhale 1 puff Two (2) times a day. 12 g 11    gabapentin (NEURONTIN) 300 MG capsule Take 1 capsule (300 mg total) by mouth Three (3) times a day. 90 capsule 2    hydrALAZINE (APRESOLINE) 10 MG tablet       hydrOXYchloroQUINE (PLAQUENIL) 200 mg tablet TAKE 1 TABLET BY MOUTH  TWICE DAILY 180 tablet 3    hydrOXYzine (ATARAX) 10 MG tablet       inhalational spacing device (AEROCHAMBER MV) Spcr 1 each by Miscellaneous route two (2) times a day. With Fovent 1 each 0    ketoconazole (NIZORAL) 2 % cream APP EXT AA D  0    lamoTRIgine (LAMICTAL) 200 MG tablet TAKE 1 TABLET BY MOUTH  TWICE DAILY 180 tablet 3    LINZESS 145 mcg capsule TAKE 1 CAPSULE(145 MCG) BY MOUTH DAILY 90 capsule 3    meclizine (ANTIVERT) 25 mg tablet Take 1 tablet (25 mg total) by mouth Three (3) times a day as needed.      melatonin 10 mg Tab Take 1 tablet by mouth.      mupirocin (BACTROBAN) 2 % ointment APP EXT IEN BID  0    mycophenolate (MYFORTIC) 360 MG TbEC Take 2 tablets (720 mg total) by mouth two (2) times a day. 360 tablet 3    omeprazole (PRILOSEC) 40 MG capsule Take 1 capsule (40 mg total) by mouth daily. 90 capsule 3    ondansetron (ZOFRAN) 4 MG tablet       polyethylene glycol (GLYCOLAX) 17 gram/dose powder       predniSONE (DELTASONE) 10 MG tablet Take 4 tablets (40 mg total) by mouth daily for 7 days, THEN 3 tablets (30 mg total) daily for 7 days, THEN 2 tablets (20 mg total) daily for 7 days, THEN 1 tablet (10 mg total) daily. 98 tablet 0    pregabalin (LYRICA) 50 MG capsule       telmisartan (MICARDIS) 80 MG tablet Take 1 tablet (80 mg total) by mouth.      traZODone (DESYREL) 100 MG tablet TAKE 1 AND 1/2 TABLETS BY  MOUTH AT NIGHT 135 tablet 3    triamcinolone (KENALOG) 0.1 %  ointment Apply twice a day to affected areas when red and itchy. Stop when improved and use a barrier cream. 80 g 1    venlafaxine (EFFEXOR) 100 MG tablet Take 1 tablet (100 mg total) by mouth in the morning. 30 tablet 11     No current facility-administered medications on file prior to visit.     ?  ??  PHYSICAL EXAM?  Vitals:    02/08/22 1046   BP: 117/74   BP Site: L Arm   BP Position: Sitting   BP Cuff Size: Medium   Pulse: 66   Temp: 36.2 ??C (97.2 ??F)   TempSrc: Temporal   Weight: (!) 111.9 kg (246 lb 9.6 oz)     General:   Pleasant 66 y.o.female in no acute distress, WDWN   Cardiovascular:  Regular rate and rhythm. No murmur, rub, or gallop. No lower extremity edema.    Lungs:  Clear to auscultation.Normal respiratory effort.    Musculoskeletal:   General: Ambulates w/o assistance.   Hands: No swelling or tenderness. Able to make a tight fist b/l   Wrists:FROM w/o swelling or tenderness   Elbows: FROM w/o swelling or tenderness   Shoulders: FROM w/ pain   Hips: FROM w/o pain   Knees: FROM w/o effusions. Surgical scar on the R.   Ankles: No swelling or tenderness   Feet: No pain with MTP squeeze    Psych:  Appropriate affect and mood   Skin:  No rashes.            Assessment/Plan:   1. Systemic lupus erythematosus, unspecified SLE type, unspecified organ involvement status (CMS-HCC)  With worsening ILD. Will touch base with pulmonology to see if they recommend any additional steroid sparing therapy for her ILD.  Will continue myfortic 720 mg BID, HCQ 200 mg BID, prednisone per pulmonology.   - Anti-DNA antibody, double-stranded  - BUN  - C3 complement  - C4 complement  - CBC w/ Differential  - Protein/Creatinine Ratio, Urine  - Creatinine  - Urinalysis with Microscopy with Culture Reflex  - Urine Culture    2. On mycophenolate mofetil therapy  Checking labs below to evaluate for medication toxicity.    - Albumin  - ALT    3. Inverse psoriasis  Stable     4. Long term steroid use and screening for osteoporosis   Update dexa  - Dexa Bone Density Skeletal; Future  - Vitamin D 25 Hydroxy (25OH D2 + D3)  - Calcium          HCM:   - PCV13 Status: 05/26/2016  - PPSV 23 Status: 08/04/2016  - PCV20: 09/30/21  - Annual Influenza vaccine. Status: Did not discuss today  -COVID-19 vaccine:Moderna 11/20/19, 12/23/19, 08/25/20, 04/29/21. UTD on bivalent booster sometime 2023 per pt.   - Bone health: Dexa 11/2017 with normal BMD.  Ordered updated Dexa.   - Plaquenil eye exam: 11/10/20, scheduled to update later this month.   - Contraception: postmenopausal          RTC 4 mo as scheduled with Dr Scarlette Calico   Greater than 30 minutes spent in visit with patient, including pre and postvisit activities.

## 2022-02-08 NOTE — Unmapped (Signed)
If you need something for heartburn, can take famotidine twice daily in addition to omeprazole when needed.

## 2022-02-09 ENCOUNTER — Ambulatory Visit: Payer: Medicare Other

## 2022-02-09 LAB — VITAMIN D 25 HYDROXY: VITAMIN D, TOTAL (25OH): 35.8 ng/mL (ref 20.0–80.0)

## 2022-02-16 ENCOUNTER — Ambulatory Visit: Admit: 2022-02-16 | Discharge: 2022-02-17 | Payer: MEDICARE

## 2022-02-16 MED ORDER — PREDNISONE 10 MG TABLET
ORAL_TABLET | Freq: Every day | ORAL | 0 refills | 90 days | Status: CP
Start: 2022-02-16 — End: 2022-05-17

## 2022-02-16 NOTE — Unmapped (Signed)
Called patient to discuss PFTs which were improved from April. She is happy with how breathing is doing, is walking 50 minutes daily. Will continue prednisone at 10mg  daily at myfortic as per rheumatology as that is likely helping her lungs as well. Follow up in August as scheduled. If PFTs decline then would consider third agent like rituximab.    Layla Maw MD  Pulmonary and Critical Care Fellow

## 2022-02-17 ENCOUNTER — Encounter: Payer: Medicare Other | Attending: Critical Care Medicine | Admitting: *Deleted

## 2022-02-17 VITALS — Ht 68.0 in | Wt 247.8 lb

## 2022-02-17 DIAGNOSIS — J849 Interstitial pulmonary disease, unspecified: Secondary | ICD-10-CM | POA: Diagnosis present

## 2022-02-17 LAB — ANTI-DNA ANTIBODY, DOUBLE-STRANDED: DSDNA ANTIBODY: NEGATIVE

## 2022-02-17 NOTE — Progress Notes (Signed)
Pulmonary Individual Treatment Plan  Patient Details  Name: Nicole Ballard MRN: [id number] Date of Birth: 1956/02/02 Referring Provider:   Flowsheet Row Pulmonary Rehab from 02/17/2022 in [facility] Cardiac and Pulmonary Rehab  Referring Provider Despina Arias MD       Initial Encounter Date:  Flowsheet Row Pulmonary Rehab from 02/17/2022 in [facility] Cardiac and Pulmonary Rehab  Date 02/17/22       Visit Diagnosis: ILD (interstitial lung disease) ([place])  Patient's Home Medications on Admission:  Current Outpatient Medications:    albuterol (VENTOLIN HFA) 108 (90 Base) MCG/ACT inhaler, SMARTSIG:2 Puff(s) By Mouth Every 6 Hours PRN, Disp: , Rfl:    amLODipine (NORVASC) 2.5 MG tablet, TAKE 3 TABLETS BY MOUTH DAILY, Disp: 270 tablet, Rfl: 3   aspirin EC 81 MG tablet, Take 1 tablet (81 mg total) by mouth daily., Disp: 30 tablet, Rfl: 0   atenolol (TENORMIN) 25 MG tablet, Take 1 tablet (25 mg total) by mouth daily., Disp: 90 tablet, Rfl: 3   atorvastatin (LIPITOR) 40 MG tablet, TAKE 1 TABLET BY MOUTH  DAILY, Disp: 90 tablet, Rfl: 3   Biotin 1 MG CAPS, Take 1 capsule by mouth daily., Disp: 30 capsule, Rfl: 0   buPROPion (WELLBUTRIN XL) 300 MG 24 hr tablet, Take 300 mg by mouth in the morning., Disp: , Rfl:    Cholecalciferol (VITAMIN D) 2000 UNITS CAPS, Take 1 capsule by mouth daily., Disp: , Rfl:    clobetasol ointment (TEMOVATE) 7.86 %, Apply 1 application topically 2 (two) times daily., Disp: , Rfl:    clonazePAM (KLONOPIN) 0.5 MG tablet, Take 0.25 mg by mouth at bedtime., Disp: , Rfl:    clotrimazole-betamethasone (LOTRISONE) cream, Apply 1 application topically 2 (two) times daily., Disp: 45 g, Rfl: 0   diclofenac Sodium (VOLTAREN) 1 % GEL, Apply topically daily as needed., Disp: , Rfl:    famotidine (PEPCID) 20 MG tablet, Take by mouth., Disp: , Rfl:    fluticasone (FLONASE) 50 MCG/ACT nasal spray, Place 2 sprays into both nostrils daily., Disp: 48 g, Rfl: 3   fluticasone (FLOVENT HFA)  220 MCG/ACT inhaler, Inhale into the lungs., Disp: , Rfl:    gabapentin (NEURONTIN) 300 MG capsule, Take 300 mg (1 capsule) in the morning and at 300 mg (1 capsule) at noon and 600 mg (2 capsules) at night., Disp: , Rfl:    hydrALAZINE (APRESOLINE) 10 MG tablet, TAKE 1 TABLET BY MOUTH THREE TIMES DAILY AS NEEDED FOR BLOOD PRESSURE ABOVE 150/90, Disp: 90 tablet, Rfl: 2   hydroxychloroquine (PLAQUENIL) 200 MG tablet, Take by mouth 2 (two) times daily., Disp: , Rfl:    hydrOXYzine (ATARAX) 10 MG tablet, Take 1 tablet (10 mg total) by mouth 3 (three) times daily as needed., Disp: 30 tablet, Rfl: 0   lamoTRIgine (LAMICTAL) 200 MG tablet, Take 400 mg by mouth at bedtime., Disp: , Rfl:    LINZESS 145 MCG CAPS capsule, Take 145 mcg by mouth daily., Disp: , Rfl:    loratadine (CLARITIN) 10 MG tablet, Take 1 tablet (10 mg total) by mouth daily., Disp: 90 tablet, Rfl: 1   Melatonin 10 MG TABS, Take 1 tablet by mouth at bedtime., Disp: , Rfl:    metFORMIN (GLUCOPHAGE-XR) 500 MG 24 hr tablet, Take 1 tablet (500 mg total) by mouth daily with breakfast., Disp: 90 tablet, Rfl: 1   mycophenolate (MYFORTIC) 360 MG TBEC EC tablet, Take 720 mg by mouth 2 (two) times daily., Disp: , Rfl:    olopatadine (PATANOL) 0.1 %  ophthalmic solution, Place 1 drop into both eyes 2 (two) times daily., Disp: 5 mL, Rfl: 1   omeprazole (PRILOSEC) 40 MG capsule, TAKE 1 CAPSULE BY MOUTH  DAILY, Disp: 90 capsule, Rfl: 3   Polyethylene Glycol 3350 (MIRALAX PO), Take by mouth., Disp: , Rfl:    predniSONE (DELTASONE) 10 MG tablet, Take by mouth., Disp: , Rfl:    telmisartan (MICARDIS) 80 MG tablet, TAKE 1 TABLET BY MOUTH DAILY, Disp: 90 tablet, Rfl: 3   traZODone (DESYREL) 100 MG tablet, Take 150 mg by mouth at bedtime., Disp: , Rfl:    triamcinolone ointment (KENALOG) 0.1 %, , Disp: , Rfl:    venlafaxine (EFFEXOR) 100 MG tablet, Take 100 mg by mouth every evening., Disp: , Rfl:   Past Medical History: Past Medical History:  Diagnosis  Date   Anxiety    Bipolar 1 disorder ([place])    Chronic kidney disease    Chronic pain    Diabetes mellitus without complication (HCC)    Fibromyalgia    GERD (gastroesophageal reflux disease)    Hearing loss of right ear    Hemifacial spasm    Hyperlipidemia    Hypertension    Insomnia    Lumbago    Osteoarthritis    Paresthesia    Rhinitis    Systemic lupus erythematosus ([place])    Vitamin D deficiency     Tobacco Use: Social History   Tobacco Use  Smoking Status Never  Smokeless Tobacco Never    Labs: Review Flowsheet  More data exists      Latest Ref Rng & Units 10/09/2019 02/14/2020 06/15/2020 12/14/2020  Labs for ITP Cardiac and Pulmonary Rehab  Cholestrol <200 mg/dL - - 151  -  LDL (calc) mg/dL (calc) - - 74  -  HDL-C > OR = 50 mg/dL - - 61  -  Trlycerides <150 mg/dL - - 78  -  Hemoglobin A1c <5.7 % of total Hgb 5.5  5.6  5.5  5.7       06/15/2021  Labs for ITP Cardiac and Pulmonary Rehab  Cholestrol 154   LDL (calc) 75   HDL-C 62   Trlycerides 89   Hemoglobin A1c 5.5      Pulmonary Assessment Scores:  Pulmonary Assessment Scores     Row Name 02/17/22 1115         ADL UCSD   ADL Phase Entry     SOB Score total 43     Rest 1     Walk 2     Stairs 3     Bath 1     Dress 3     Shop 2       CAT Score   CAT Score 26       mMRC Score   mMRC Score 2              UCSD: Self-administered rating of dyspnea associated with activities of daily living (ADLs) 6-point scale (0 = "not at all" to 5 = "maximal or unable to do because of breathlessness")  Scoring Scores range from 0 to 120.  Minimally important difference is 5 units  CAT: CAT can identify the health impairment of COPD patients and is better correlated with disease progression.  CAT has a scoring range of zero to 40. The CAT score is classified into four groups of low (less than 10), medium (10 - 20), high (21-30) and very high (31-40) based on the impact level of disease on  health  status. A CAT score over 10 suggests significant symptoms.  A worsening CAT score could be explained by an exacerbation, poor medication adherence, poor inhaler technique, or progression of COPD or comorbid conditions.  CAT MCID is 2 points  mMRC: mMRC (Modified Medical Research Council) Dyspnea Scale is used to assess the degree of baseline functional disability in patients of respiratory disease due to dyspnea. No minimal important difference is established. A decrease in score of 1 point or greater is considered a positive change.   Pulmonary Function Assessment:  Pulmonary Function Assessment - 02/17/22 1115       Breath   Shortness of Breath Yes;Panic with Shortness of Breath;Fear of Shortness of Breath;Limiting activity             Exercise Target Goals: Exercise Program Goal: Individual exercise prescription set using results from initial 6 min walk test and THRR while considering  patient's activity barriers and safety.   Exercise Prescription Goal: Initial exercise prescription builds to 30-45 minutes a day of aerobic activity, 2-3 days per week.  Home exercise guidelines will be given to patient during program as part of exercise prescription that the participant will acknowledge.  Education: Aerobic Exercise: - Group verbal and visual presentation on the components of exercise prescription. Introduces F.I.T.T principle from ACSM for exercise prescriptions.  Reviews F.I.T.T. principles of aerobic exercise including progression. Written material given at graduation.   Education: Resistance Exercise: - Group verbal and visual presentation on the components of exercise prescription. Introduces F.I.T.T principle from ACSM for exercise prescriptions  Reviews F.I.T.T. principles of resistance exercise including progression. Written material given at graduation.    Education: Exercise & Equipment Safety: - Individual verbal instruction and demonstration of equipment use and  safety with use of the equipment. Flowsheet Row Pulmonary Rehab from 02/17/2022 in [facility] Cardiac and Pulmonary Rehab  Date 02/17/22  Educator [facility]  Instruction Review Code 1- Verbalizes Understanding       Education: Exercise Physiology & General Exercise Guidelines: - Group verbal and written instruction with models to review the exercise physiology of the cardiovascular system and associated critical values. Provides general exercise guidelines with specific guidelines to those with heart or lung disease.    Education: Flexibility, Balance, Mind/Body Relaxation: - Group verbal and visual presentation with interactive activity on the components of exercise prescription. Introduces F.I.T.T principle from ACSM for exercise prescriptions. Reviews F.I.T.T. principles of flexibility and balance exercise training including progression. Also discusses the mind body connection.  Reviews various relaxation techniques to help reduce and manage stress (i.e. Deep breathing, progressive muscle relaxation, and visualization). Balance handout provided to take home. Written material given at graduation.   Activity Barriers & Risk Stratification:  Activity Barriers & Cardiac Risk Stratification - 02/17/22 1048       Activity Barriers & Cardiac Risk Stratification   Activity Barriers Right Knee Replacement;Muscular Weakness;Deconditioning;Balance Concerns;Shortness of Breath             6 Minute Walk:  6 Minute Walk     Row Name 02/17/22 1045         6 Minute Walk   Phase Initial     Distance 1320 feet     Walk Time 6 minutes     # of Rest Breaks 0     MPH 2.5     METS 2.72     RPE 11     Perceived Dyspnea  1     VO2 Peak 9.52  Symptoms Yes (comment)     Comments SOB, dizzy on turns     Resting HR 68 bpm     Resting BP 136/64     Resting Oxygen Saturation  96 %     Exercise Oxygen Saturation  during 6 min walk 85 %     Max Ex. HR 103 bpm     Max Ex. BP 134/72     2 Minute Post BP  132/62       Interval HR   1 Minute HR 86     2 Minute HR 95     3 Minute HR 103     4 Minute HR 102     5 Minute HR 102     6 Minute HR 103     2 Minute Post HR 73     Interval Heart Rate? Yes       Interval Oxygen   Interval Oxygen? Yes     Baseline Oxygen Saturation % 96 %     1 Minute Oxygen Saturation % 89 %     1 Minute Liters of Oxygen 0 L  Room Air     2 Minute Oxygen Saturation % 87 %     2 Minute Liters of Oxygen 0 L     3 Minute Oxygen Saturation % 86 %     3 Minute Liters of Oxygen 0 L     4 Minute Oxygen Saturation % 85 %     4 Minute Liters of Oxygen 0 L     5 Minute Oxygen Saturation % 87 %     5 Minute Liters of Oxygen 0 L     6 Minute Oxygen Saturation % 86 %     6 Minute Liters of Oxygen 0 L     2 Minute Post Oxygen Saturation % 95 %     2 Minute Post Liters of Oxygen 0 L             Oxygen Initial Assessment:  Oxygen Initial Assessment - 02/17/22 1115       Home Oxygen   Home Oxygen Device None    Sleep Oxygen Prescription None    Home Exercise Oxygen Prescription None    Home Resting Oxygen Prescription None    Compliance with Home Oxygen Use Yes      Initial 6 min Walk   Oxygen Used None      Program Oxygen Prescription   Program Oxygen Prescription None      Intervention   Short Term Goals To learn and demonstrate proper pursed lip breathing techniques or other breathing techniques. ;To learn and demonstrate proper use of respiratory medications;To learn and understand importance of maintaining oxygen saturations>88%;To learn and understand importance of monitoring SPO2 with pulse oximeter and demonstrate accurate use of the pulse oximeter.    Long  Term Goals Exhibits proper breathing techniques, such as pursed lip breathing or other method taught during program session;Demonstrates proper use of MDI's;Compliance with respiratory medication;Maintenance of O2 saturations>88%;Verbalizes importance of monitoring SPO2 with pulse oximeter and  return demonstration             Oxygen Re-Evaluation:   Oxygen Discharge (Final Oxygen Re-Evaluation):   Initial Exercise Prescription:  Initial Exercise Prescription - 02/17/22 1000       Date of Initial Exercise RX and Referring Provider   Date 02/17/22    Referring Provider Despina Arias MD      Oxygen   Maintain Oxygen Saturation  88% or higher      Treadmill   MPH 2.2    Grade 1    Minutes 15    METs 2.99      Recumbant Bike   Level 3    RPM 50    Watts 25    Minutes 15    METs 3      NuStep   Level 3    SPM 80    Minutes 15    METs 3      Track   Laps 35    Minutes 15    METs 2.9      Prescription Details   Frequency (times per week) 2    Duration Progress to 30 minutes of continuous aerobic without signs/symptoms of physical distress      Intensity   THRR 40-80% of Max Heartrate 103-138    Ratings of Perceived Exertion 11-13    Perceived Dyspnea 0-4      Progression   Progression Continue to progress workloads to maintain intensity without signs/symptoms of physical distress.      Resistance Training   Training Prescription Yes    Weight 3 lb    Reps 10-15             Perform Capillary Blood Glucose checks as needed.  Exercise Prescription Changes:   Exercise Prescription Changes     Row Name 02/17/22 1000             Response to Exercise   Blood Pressure (Admit) 136/64       Blood Pressure (Exercise) 134/72       Blood Pressure (Exit) 122/62       Heart Rate (Admit) 68 bpm       Heart Rate (Exercise) 103 bpm       Heart Rate (Exit) 75 bpm       Oxygen Saturation (Admit) 96 %       Oxygen Saturation (Exercise) 85 %       Oxygen Saturation (Exit) 95 %       Rating of Perceived Exertion (Exercise) 11       Perceived Dyspnea (Exercise) 1       Symptoms SOB, dizzy on turns       Comments walk test results                Exercise Comments:   Exercise Goals and Review:   Exercise Goals     Row Name  02/17/22 1112             Exercise Goals   Increase Physical Activity Yes       Intervention Provide advice, education, support and counseling about physical activity/exercise needs.;Develop an individualized exercise prescription for aerobic and resistive training based on initial evaluation findings, risk stratification, comorbidities and participant's personal goals.       Expected Outcomes Short Term: Attend rehab on a regular basis to increase amount of physical activity.;Long Term: Add in home exercise to make exercise part of routine and to increase amount of physical activity.;Long Term: Exercising regularly at least 3-5 days a week.       Increase Strength and Stamina Yes       Intervention Provide advice, education, support and counseling about physical activity/exercise needs.;Develop an individualized exercise prescription for aerobic and resistive training based on initial evaluation findings, risk stratification, comorbidities and participant's personal goals.       Expected Outcomes Short Term: Increase workloads from initial exercise prescription  for resistance, speed, and METs.;Short Term: Perform resistance training exercises routinely during rehab and add in resistance training at home;Long Term: Improve cardiorespiratory fitness, muscular endurance and strength as measured by increased METs and functional capacity (6MWT)       Able to understand and use rate of perceived exertion (RPE) scale Yes       Intervention Provide education and explanation on how to use RPE scale       Expected Outcomes Short Term: Able to use RPE daily in rehab to express subjective intensity level;Long Term:  Able to use RPE to guide intensity level when exercising independently       Able to understand and use Dyspnea scale Yes       Intervention Provide education and explanation on how to use Dyspnea scale       Expected Outcomes Short Term: Able to use Dyspnea scale daily in rehab to express  subjective sense of shortness of breath during exertion;Long Term: Able to use Dyspnea scale to guide intensity level when exercising independently       Knowledge and understanding of Target Heart Rate Range (THRR) Yes       Intervention Provide education and explanation of THRR including how the numbers were predicted and where they are located for reference       Expected Outcomes Short Term: Able to state/look up THRR;Short Term: Able to use daily as guideline for intensity in rehab;Long Term: Able to use THRR to govern intensity when exercising independently       Able to check pulse independently Yes       Intervention Provide education and demonstration on how to check pulse in carotid and radial arteries.;Review the importance of being able to check your own pulse for safety during independent exercise       Expected Outcomes Short Term: Able to explain why pulse checking is important during independent exercise;Long Term: Able to check pulse independently and accurately       Understanding of Exercise Prescription Yes       Intervention Provide education, explanation, and written materials on patient's individual exercise prescription       Expected Outcomes Short Term: Able to explain program exercise prescription;Long Term: Able to explain home exercise prescription to exercise independently                Exercise Goals Re-Evaluation :   Discharge Exercise Prescription (Final Exercise Prescription Changes):  Exercise Prescription Changes - 02/17/22 1000       Response to Exercise   Blood Pressure (Admit) 136/64    Blood Pressure (Exercise) 134/72    Blood Pressure (Exit) 122/62    Heart Rate (Admit) 68 bpm    Heart Rate (Exercise) 103 bpm    Heart Rate (Exit) 75 bpm    Oxygen Saturation (Admit) 96 %    Oxygen Saturation (Exercise) 85 %    Oxygen Saturation (Exit) 95 %    Rating of Perceived Exertion (Exercise) 11    Perceived Dyspnea (Exercise) 1    Symptoms SOB, dizzy  on turns    Comments walk test results             Nutrition:  Target Goals: Understanding of nutrition guidelines, daily intake of sodium '1500mg'$ , cholesterol '200mg'$ , calories 30% from fat and 7% or less from saturated fats, daily to have 5 or more servings of fruits and vegetables.  Education: All About Nutrition: -Group instruction provided by verbal, written material, interactive activities, discussions, models,  and posters to present general guidelines for heart healthy nutrition including fat, fiber, MyPlate, the role of sodium in heart healthy nutrition, utilization of the nutrition label, and utilization of this knowledge for meal planning. Follow up email sent as well. Written material given at graduation.   Biometrics:  Pre Biometrics - 02/17/22 1112       Pre Biometrics   Height '5\' 8"'$  (1.727 m)    Weight 247 lb 12.8 oz (112.4 kg)    BMI (Calculated) 37.69    Single Leg Stand 4.4 seconds              Nutrition Therapy Plan and Nutrition Goals:  Nutrition Therapy & Goals - 02/17/22 1113       Intervention Plan   Intervention Prescribe, educate and counsel regarding individualized specific dietary modifications aiming towards targeted core components such as weight, hypertension, lipid management, diabetes, heart failure and other comorbidities.    Expected Outcomes Short Term Goal: Understand basic principles of dietary content, such as calories, fat, sodium, cholesterol and nutrients.;Short Term Goal: A plan has been developed with personal nutrition goals set during dietitian appointment.;Long Term Goal: Adherence to prescribed nutrition plan.             Nutrition Assessments:  MEDIFICTS Score Key: ?70 Need to make dietary changes  40-70 Heart Healthy Diet ? 40 Therapeutic Level Cholesterol Diet  Flowsheet Row Pulmonary Rehab from 02/17/2022 in [facility] Cardiac and Pulmonary Rehab  Picture Your Plate Total Score on Admission 62      Picture Your Plate  Scores: <99 Unhealthy dietary pattern with much room for improvement. 41-50 Dietary pattern unlikely to meet recommendations for good health and room for improvement. 51-60 More healthful dietary pattern, with some room for improvement.  >60 Healthy dietary pattern, although there may be some specific behaviors that could be improved.   Nutrition Goals Re-Evaluation:   Nutrition Goals Discharge (Final Nutrition Goals Re-Evaluation):   Psychosocial: Target Goals: Acknowledge presence or absence of significant depression and/or stress, maximize coping skills, provide positive support system. Participant is able to verbalize types and ability to use techniques and skills needed for reducing stress and depression.   Education: Stress, Anxiety, and Depression - Group verbal and visual presentation to define topics covered.  Reviews how body is impacted by stress, anxiety, and depression.  Also discusses healthy ways to reduce stress and to treat/manage anxiety and depression.  Written material given at graduation.   Education: Sleep Hygiene -Provides group verbal and written instruction about how sleep can affect your health.  Define sleep hygiene, discuss sleep cycles and impact of sleep habits. Review good sleep hygiene tips.    Initial Review & Psychosocial Screening:  Initial Psych Review & Screening - 02/02/22 1421       Initial Review   Current issues with Current Psychotropic Meds;Current Depression;Current Anxiety/Panic;Current Stress Concerns    Source of Stress Concerns Chronic Illness    Comments several health issues can cause her to feel down. Tries to focus on the positive.      Family Dynamics   Good Support System? Yes   son and daughter, friend     Barriers   Psychosocial barriers to participate in program There are no identifiable barriers or psychosocial needs.;The patient should benefit from training in stress management and relaxation.      Screening  Interventions   Interventions Encouraged to exercise;To provide support and resources with identified psychosocial needs;Provide feedback about the scores to participant  Expected Outcomes Short Term goal: Utilizing psychosocial counselor, staff and physician to assist with identification of specific Stressors or current issues interfering with healing process. Setting desired goal for each stressor or current issue identified.;Long Term Goal: Stressors or current issues are controlled or eliminated.;Short Term goal: Identification and review with participant of any Quality of Life or Depression concerns found by scoring the questionnaire.;Long Term goal: The participant improves quality of Life and PHQ9 Scores as seen by post scores and/or verbalization of changes             Quality of Life Scores:  Scores of 19 and below usually indicate a poorer quality of life in these areas.  A difference of  2-3 points is a clinically meaningful difference.  A difference of 2-3 points in the total score of the Quality of Life Index has been associated with significant improvement in overall quality of life, self-image, physical symptoms, and general health in studies assessing change in quality of life.  PHQ-9: Review Flowsheet  More data exists      02/17/2022 12/20/2021 10/01/2021 09/07/2021 09/01/2021  Depression screen PHQ 2/9  Decreased Interest 1 0 0 0 0  Down, Depressed, Hopeless 1 0 0 0 0  PHQ - 2 Score 2 0 0 0 0  Altered sleeping 2 0 0 - -  Tired, decreased energy 1 0 0 - -  Change in appetite 0 0 0 - -  Feeling bad or failure about yourself  1 3 0 - -  Trouble concentrating 2 0 0 - -  Moving slowly or fidgety/restless 1 0 0 - -  Suicidal thoughts 0 0 0 - -  PHQ-9 Score 9 3 0 - -  Difficult doing work/chores Somewhat difficult - - - -   Interpretation of Total Score  Total Score Depression Severity:  1-4 = Minimal depression, 5-9 = Mild depression, 10-14 = Moderate depression, 15-19 =  Moderately severe depression, 20-27 = Severe depression   Psychosocial Evaluation and Intervention:  Psychosocial Evaluation - 02/02/22 1435       Psychosocial Evaluation & Interventions   Interventions Encouraged to exercise with the program and follow exercise prescription    Comments Mena has no barriers to attending the program. She lives alone. She has a son and a daughter that are her support along with her firends. She is on meds for depression and anxiety. She has multiple illnesses and it can bring her down at times. She tries hard to look at the bright side to prevent being pulled down. She wants to see if she can help her lungs heal as much as possible. She is reday to get started.    Expected Outcomes STG Naysha is able to attend all scheduled sessions. She learns rlaxation and de stress exercises while here. LTG Arleta is able to continue her exercise progression and manage her stress and depression easier.    Continue Psychosocial Services  Follow up required by staff             Psychosocial Re-Evaluation:   Psychosocial Discharge (Final Psychosocial Re-Evaluation):   Education: Education Goals: Education classes will be provided on a weekly basis, covering required topics. Participant will state understanding/return demonstration of topics presented.  Learning Barriers/Preferences:   General Pulmonary Education Topics:  Infection Prevention: - Provides verbal and written material to individual with discussion of infection control including proper hand washing and proper equipment cleaning during exercise session. Flowsheet Row Pulmonary Rehab from 02/17/2022 in [facility] Cardiac and  Pulmonary Rehab  Date 02/17/22  Educator [facility]  Instruction Review Code 1- Verbalizes Understanding       Falls Prevention: - Provides verbal and written material to individual with discussion of falls prevention and safety. Flowsheet Row Pulmonary Rehab from 02/17/2022 in [facility] Cardiac  and Pulmonary Rehab  Date 02/02/22  Educator [facility]  Instruction Review Code 1- Verbalizes Understanding       Chronic Lung Disease Review: - Group verbal instruction with posters, models, PowerPoint presentations and videos,  to review new updates, new respiratory medications, new advancements in procedures and treatments. Providing information on websites and "800" numbers for continued self-education. Includes information about supplement oxygen, available portable oxygen systems, continuous and intermittent flow rates, oxygen safety, concentrators, and Medicare reimbursement for oxygen. Explanation of Pulmonary Drugs, including class, frequency, complications, importance of spacers, rinsing mouth after steroid MDI's, and proper cleaning methods for nebulizers. Review of basic lung anatomy and physiology related to function, structure, and complications of lung disease. Review of risk factors. Discussion about methods for diagnosing sleep apnea and types of masks and machines for OSA. Includes a review of the use of types of environmental controls: home humidity, furnaces, filters, dust mite/pet prevention, HEPA vacuums. Discussion about weather changes, air quality and the benefits of nasal washing. Instruction on Warning signs, infection symptoms, calling MD promptly, preventive modes, and value of vaccinations. Review of effective airway clearance, coughing and/or vibration techniques. Emphasizing that all should Create an Action Plan. Written material given at graduation. Flowsheet Row Pulmonary Rehab from 02/17/2022 in [facility] Cardiac and Pulmonary Rehab  Education need identified 02/17/22  Date 02/17/22  Educator [facility]  Instruction Review Code 1- Verbalizes Understanding       AED/CPR: - Group verbal and written instruction with the use of models to demonstrate the basic use of the AED with the basic ABC's of resuscitation.    Anatomy and Cardiac Procedures: - Group verbal and visual  presentation and models provide information about basic cardiac anatomy and function. Reviews the testing methods done to diagnose heart disease and the outcomes of the test results. Describes the treatment choices: Medical Management, Angioplasty, or Coronary Bypass Surgery for treating various heart conditions including Myocardial Infarction, Angina, Valve Disease, and Cardiac Arrhythmias.  Written material given at graduation.   Medication Safety: - Group verbal and visual instruction to review commonly prescribed medications for heart and lung disease. Reviews the medication, class of the drug, and side effects. Includes the steps to properly store meds and maintain the prescription regimen.  Written material given at graduation.   Other: -Provides group and verbal instruction on various topics (see comments)   Knowledge Questionnaire Score:  Knowledge Questionnaire Score - 02/17/22 1114       Knowledge Questionnaire Score   Pre Score 12/18              Core Components/Risk Factors/Patient Goals at Admission:  Personal Goals and Risk Factors at Admission - 02/17/22 1114       Core Components/Risk Factors/Patient Goals on Admission    Weight Management Yes;Weight Loss;Obesity    Intervention Weight Management: Develop a combined nutrition and exercise program designed to reach desired caloric intake, while maintaining appropriate intake of nutrient and fiber, sodium and fats, and appropriate energy expenditure required for the weight goal.;Weight Management/Obesity: Establish reasonable short term and long term weight goals.;Obesity: Provide education and appropriate resources to help participant work on and attain dietary goals.;Weight Management: Provide education and appropriate resources to help participant work on  and attain dietary goals.    Admit Weight 247 lb 12.8 oz (112.4 kg)    Goal Weight: Short Term 243 lb (110.2 kg)    Goal Weight: Long Term 200 lb (90.7 kg)     Expected Outcomes Short Term: Continue to assess and modify interventions until short term weight is achieved;Weight Loss: Understanding of general recommendations for a balanced deficit meal plan, which promotes 1-2 lb weight loss per week and includes a negative energy balance of [phone] kcal/d;Understanding recommendations for meals to include 15-35% energy as protein, 25-35% energy from fat, 35-60% energy from carbohydrates, less than '200mg'$  of dietary cholesterol, 20-35 gm of total fiber daily;Understanding of distribution of calorie intake throughout the day with the consumption of 4-5 meals/snacks    Improve shortness of breath with ADL's Yes    Intervention Provide education, individualized exercise plan and daily activity instruction to help decrease symptoms of SOB with activities of daily living.    Expected Outcomes Short Term: Improve cardiorespiratory fitness to achieve a reduction of symptoms when performing ADLs;Long Term: Be able to perform more ADLs without symptoms or delay the onset of symptoms    Increase knowledge of respiratory medications and ability to use respiratory devices properly  Yes    Intervention Provide education and demonstration as needed of appropriate use of medications, inhalers, and oxygen therapy.    Expected Outcomes Short Term: Achieves understanding of medications use. Understands that oxygen is a medication prescribed by physician. Demonstrates appropriate use of inhaler and oxygen therapy.;Long Term: Maintain appropriate use of medications, inhalers, and oxygen therapy.    Hypertension Yes    Intervention Provide education on lifestyle modifcations including regular physical activity/exercise, weight management, moderate sodium restriction and increased consumption of fresh fruit, vegetables, and low fat dairy, alcohol moderation, and smoking cessation.;Monitor prescription use compliance.    Expected Outcomes Short Term: Continued assessment and intervention  until BP is < 140/55m HG in hypertensive participants. < 130/865mHG in hypertensive participants with diabetes, heart failure or chronic kidney disease.;Long Term: Maintenance of blood pressure at goal levels.    Lipids Yes    Intervention Provide education and support for participant on nutrition & aerobic/resistive exercise along with prescribed medications to achieve LDL '70mg'$ , HDL >'40mg'$ .    Expected Outcomes Short Term: Participant states understanding of desired cholesterol values and is compliant with medications prescribed. Participant is following exercise prescription and nutrition guidelines.;Long Term: Cholesterol controlled with medications as prescribed, with individualized exercise RX and with personalized nutrition plan. Value goals: LDL < '70mg'$ , HDL > 40 mg.             Education:Diabetes - Individual verbal and written instruction to review signs/symptoms of diabetes, desired ranges of glucose level fasting, after meals and with exercise. Acknowledge that pre and post exercise glucose checks will be done for 3 sessions at entry of program.   Know Your Numbers and Heart Failure: - Group verbal and visual instruction to discuss disease risk factors for cardiac and pulmonary disease and treatment options.  Reviews associated critical values for Overweight/Obesity, Hypertension, Cholesterol, and Diabetes.  Discusses basics of heart failure: signs/symptoms and treatments.  Introduces Heart Failure Zone chart for action plan for heart failure.  Written material given at graduation.   Core Components/Risk Factors/Patient Goals Review:    Core Components/Risk Factors/Patient Goals at Discharge (Final Review):    ITP Comments:  ITP Comments     Row Name 02/02/22 1433 02/17/22 1044  ITP Comments Virtual orientation call completed today. shehas an appointment on Date: 02/09/2022  for EP eval and gym Orientation.  Documentation of diagnosis can be found in [facility] Date:  12/29/2021 . Completed 6MWT and gym orientation. Initial ITP created and sent for review to Dr. Zetta Bills, Medical Director.               Comments: Initial ITP

## 2022-02-17 NOTE — Patient Instructions (Addendum)
Patient Instructions  Patient Details  Name: Nicole Ballard MRN: [id number] Date of Birth: 1956-07-21 Referring Provider:  Sammie Bench, MD  Below are your personal goals for exercise, nutrition, and risk factors. Our goal is to help you stay on track towards obtaining and maintaining these goals. We will be discussing your progress on these goals with you throughout the program.  Initial Exercise Prescription:  Initial Exercise Prescription - 02/17/22 1000       Date of Initial Exercise RX and Referring Provider   Date 02/17/22    Referring Provider Despina Arias MD      Oxygen   Maintain Oxygen Saturation 88% or higher      Treadmill   MPH 2.2    Grade 1    Minutes 15    METs 2.99      Recumbant Bike   Level 3    RPM 50    Watts 25    Minutes 15    METs 3      NuStep   Level 3    SPM 80    Minutes 15    METs 3      Track   Laps 35    Minutes 15    METs 2.9      Prescription Details   Frequency (times per week) 2    Duration Progress to 30 minutes of continuous aerobic without signs/symptoms of physical distress      Intensity   THRR 40-80% of Max Heartrate 103-138    Ratings of Perceived Exertion 11-13    Perceived Dyspnea 0-4      Progression   Progression Continue to progress workloads to maintain intensity without signs/symptoms of physical distress.      Resistance Training   Training Prescription Yes    Weight 3 lb    Reps 10-15             Exercise Goals: Frequency: Be able to perform aerobic exercise two to three times per week in program working toward 2-5 days per week of home exercise.  Intensity: Work with a perceived exertion of 11 (fairly light) - 15 (hard) while following your exercise prescription.  We will make changes to your prescription with you as you progress through the program.   Duration: Be able to do 30 to 45 minutes of continuous aerobic exercise in addition to a 5 minute warm-up and a 5 minute cool-down  routine.   Nutrition Goals: Your personal nutrition goals will be established when you do your nutrition analysis with the dietician.  The following are general nutrition guidelines to follow: Cholesterol < '200mg'$ /day Sodium < '1500mg'$ /day Fiber: Women over 50 yrs - 21 grams per day  Personal Goals:  Personal Goals and Risk Factors at Admission - 02/17/22 1114       Core Components/Risk Factors/Patient Goals on Admission    Weight Management Yes;Weight Loss;Obesity    Intervention Weight Management: Develop a combined nutrition and exercise program designed to reach desired caloric intake, while maintaining appropriate intake of nutrient and fiber, sodium and fats, and appropriate energy expenditure required for the weight goal.;Weight Management/Obesity: Establish reasonable short term and long term weight goals.;Obesity: Provide education and appropriate resources to help participant work on and attain dietary goals.;Weight Management: Provide education and appropriate resources to help participant work on and attain dietary goals.    Admit Weight 247 lb 12.8 oz (112.4 kg)    Goal Weight: Short Term 243 lb (110.2 kg)  Goal Weight: Long Term 200 lb (90.7 kg)    Expected Outcomes Short Term: Continue to assess and modify interventions until short term weight is achieved;Weight Loss: Understanding of general recommendations for a balanced deficit meal plan, which promotes 1-2 lb weight loss per week and includes a negative energy balance of [phone] kcal/d;Understanding recommendations for meals to include 15-35% energy as protein, 25-35% energy from fat, 35-60% energy from carbohydrates, less than '200mg'$  of dietary cholesterol, 20-35 gm of total fiber daily;Understanding of distribution of calorie intake throughout the day with the consumption of 4-5 meals/snacks    Improve shortness of breath with ADL's Yes    Intervention Provide education, individualized exercise plan and daily activity  instruction to help decrease symptoms of SOB with activities of daily living.    Expected Outcomes Short Term: Improve cardiorespiratory fitness to achieve a reduction of symptoms when performing ADLs;Long Term: Be able to perform more ADLs without symptoms or delay the onset of symptoms    Increase knowledge of respiratory medications and ability to use respiratory devices properly  Yes    Intervention Provide education and demonstration as needed of appropriate use of medications, inhalers, and oxygen therapy.    Expected Outcomes Short Term: Achieves understanding of medications use. Understands that oxygen is a medication prescribed by physician. Demonstrates appropriate use of inhaler and oxygen therapy.;Long Term: Maintain appropriate use of medications, inhalers, and oxygen therapy.    Hypertension Yes    Intervention Provide education on lifestyle modifcations including regular physical activity/exercise, weight management, moderate sodium restriction and increased consumption of fresh fruit, vegetables, and low fat dairy, alcohol moderation, and smoking cessation.;Monitor prescription use compliance.    Expected Outcomes Short Term: Continued assessment and intervention until BP is < 140/33m HG in hypertensive participants. < 130/832mHG in hypertensive participants with diabetes, heart failure or chronic kidney disease.;Long Term: Maintenance of blood pressure at goal levels.    Lipids Yes    Intervention Provide education and support for participant on nutrition & aerobic/resistive exercise along with prescribed medications to achieve LDL '70mg'$ , HDL >'40mg'$ .    Expected Outcomes Short Term: Participant states understanding of desired cholesterol values and is compliant with medications prescribed. Participant is following exercise prescription and nutrition guidelines.;Long Term: Cholesterol controlled with medications as prescribed, with individualized exercise RX and with personalized nutrition  plan. Value goals: LDL < '70mg'$ , HDL > 40 mg.             Tobacco Use Initial Evaluation: Social History   Tobacco Use  Smoking Status Never  Smokeless Tobacco Never    Exercise Goals and Review:  Exercise Goals     Row Name 02/17/22 1112             Exercise Goals   Increase Physical Activity Yes       Intervention Provide advice, education, support and counseling about physical activity/exercise needs.;Develop an individualized exercise prescription for aerobic and resistive training based on initial evaluation findings, risk stratification, comorbidities and participant's personal goals.       Expected Outcomes Short Term: Attend rehab on a regular basis to increase amount of physical activity.;Long Term: Add in home exercise to make exercise part of routine and to increase amount of physical activity.;Long Term: Exercising regularly at least 3-5 days a week.       Increase Strength and Stamina Yes       Intervention Provide advice, education, support and counseling about physical activity/exercise needs.;Develop an individualized exercise prescription for aerobic and  resistive training based on initial evaluation findings, risk stratification, comorbidities and participant's personal goals.       Expected Outcomes Short Term: Increase workloads from initial exercise prescription for resistance, speed, and METs.;Short Term: Perform resistance training exercises routinely during rehab and add in resistance training at home;Long Term: Improve cardiorespiratory fitness, muscular endurance and strength as measured by increased METs and functional capacity (6MWT)       Able to understand and use rate of perceived exertion (RPE) scale Yes       Intervention Provide education and explanation on how to use RPE scale       Expected Outcomes Short Term: Able to use RPE daily in rehab to express subjective intensity level;Long Term:  Able to use RPE to guide intensity level when exercising  independently       Able to understand and use Dyspnea scale Yes       Intervention Provide education and explanation on how to use Dyspnea scale       Expected Outcomes Short Term: Able to use Dyspnea scale daily in rehab to express subjective sense of shortness of breath during exertion;Long Term: Able to use Dyspnea scale to guide intensity level when exercising independently       Knowledge and understanding of Target Heart Rate Range (THRR) Yes       Intervention Provide education and explanation of THRR including how the numbers were predicted and where they are located for reference       Expected Outcomes Short Term: Able to state/look up THRR;Short Term: Able to use daily as guideline for intensity in rehab;Long Term: Able to use THRR to govern intensity when exercising independently       Able to check pulse independently Yes       Intervention Provide education and demonstration on how to check pulse in carotid and radial arteries.;Review the importance of being able to check your own pulse for safety during independent exercise       Expected Outcomes Short Term: Able to explain why pulse checking is important during independent exercise;Long Term: Able to check pulse independently and accurately       Understanding of Exercise Prescription Yes       Intervention Provide education, explanation, and written materials on patient's individual exercise prescription       Expected Outcomes Short Term: Able to explain program exercise prescription;Long Term: Able to explain home exercise prescription to exercise independently                Copy of goals given to participant.

## 2022-02-19 MED ORDER — FAMOTIDINE 20 MG TABLET
ORAL_TABLET | 2 refills | 0 days
Start: 2022-02-19 — End: ?

## 2022-02-22 ENCOUNTER — Ambulatory Visit: Admit: 2022-02-22 | Discharge: 2022-02-23 | Payer: MEDICARE

## 2022-02-22 DIAGNOSIS — J849 Interstitial pulmonary disease, unspecified: Secondary | ICD-10-CM | POA: Diagnosis not present

## 2022-02-22 DIAGNOSIS — H04129 Dry eye syndrome of unspecified lacrimal gland: Principal | ICD-10-CM

## 2022-02-22 DIAGNOSIS — H269 Unspecified cataract: Principal | ICD-10-CM

## 2022-02-22 DIAGNOSIS — H43393 Other vitreous opacities, bilateral: Principal | ICD-10-CM

## 2022-02-22 DIAGNOSIS — M329 Systemic lupus erythematosus, unspecified: Principal | ICD-10-CM

## 2022-02-22 DIAGNOSIS — Z79899 Other long term (current) drug therapy: Principal | ICD-10-CM

## 2022-02-22 DIAGNOSIS — G6289 Other specified polyneuropathies: Principal | ICD-10-CM

## 2022-02-22 MED ORDER — GABAPENTIN 300 MG CAPSULE
ORAL_CAPSULE | Freq: Three times a day (TID) | ORAL | 2 refills | 30 days | Status: CP
Start: 2022-02-22 — End: 2022-05-23

## 2022-02-22 NOTE — Progress Notes (Signed)
Daily Session Note  Patient Details  Name: Nicole Ballard MRN: [id number] Date of Birth: Oct 14, 1955 Referring Provider:   Flowsheet Row Pulmonary Rehab from 02/17/2022 in [facility] Cardiac and Pulmonary Rehab  Referring Provider Despina Arias MD       Encounter Date: 02/22/2022  Check In:  Session Check In - 02/22/22 0751       Check-In   Supervising physician immediately available to respond to emergencies See telemetry face sheet for immediately available ER MD    Location [facility]-Cardiac & Pulmonary Rehab    Staff Present Earlean Shawl, BS, ACSM CEP, Exercise Physiologist;Zendaya Groseclose Rosalia Hammers, MPA, RN;Melissa Caiola, RDN, LDN    Virtual Visit No    Medication changes reported     No    Fall or balance concerns reported    No    Tobacco Cessation No Change    Warm-up and Cool-down Performed on first and last piece of equipment    Resistance Training Performed Yes    VAD Patient? No    PAD/SET Patient? No      Pain Assessment   Currently in Pain? No/denies                Social History   Tobacco Use  Smoking Status Never  Smokeless Tobacco Never    Goals Met:  Independence with exercise equipment Exercise tolerated well No report of concerns or symptoms today Strength training completed today  Goals Unmet:  Not Applicable  Comments: First full day of exercise!  Patient was oriented to gym and equipment including functions, settings, policies, and procedures.  Patient's individual exercise prescription and treatment plan were reviewed.  All starting workloads were established based on the results of the 6 minute walk test done at initial orientation visit.  The plan for exercise progression was also introduced and progression will be customized based on patient's performance and goals.    Dr. Emily Filbert is Medical Director for [facility].  Dr. Ottie Glazier is Medical Director for [facility] Pulmonary Rehabilitation.

## 2022-02-22 NOTE — Unmapped (Signed)
Refill request received for patient fax from [facility]     Medication Requested: Gabapentin 300mg  caps  Last Office Visit: 12/22/2021   Next Office Visit: 03/04/2022  Last Prescriber: August Luz [facility])    Please refill if appropriate

## 2022-02-22 NOTE — Unmapped (Unsigned)
1. Plaquenil 200mg  bid since appx 2000 for lupus  - 94 kg  - patient denies changes in vision  - normal exam and testing.  2. Dry eyes  -artificial tears 4/4  3 syneresis OU- stable.  4. cataract- not visually significant. Observe. New rx provided.      rtc 1 year OCT FAF, VF  Cc Dr. Aldean Ast    INTERPRETATION EXTENDED OPHTHALMOSCOPT MACULA (90Dlens)  Macula - right:  Normal, no maculopathy, +syneresis  Macula - left:  Normal, no maculopathy, + syneresis

## 2022-02-24 DIAGNOSIS — J849 Interstitial pulmonary disease, unspecified: Secondary | ICD-10-CM

## 2022-02-24 NOTE — Progress Notes (Signed)
Daily Session Note  Patient Details  Name: Nicole Ballard MRN: [id number] Date of Birth: 01-Apr-1956 Referring Provider:   Flowsheet Row Pulmonary Rehab from 02/17/2022 in [facility] Cardiac and Pulmonary Rehab  Referring Provider Despina Arias MD       Encounter Date: 02/24/2022  Check In:  Session Check In - 02/24/22 0807       Check-In   Supervising physician immediately available to respond to emergencies See telemetry face sheet for immediately available ER MD    Location [facility]-Cardiac & Pulmonary Rehab    Staff Present Carson Myrtle, BS, RRT, CPFT;Joseph Karie Fetch, MPA, RN    Virtual Visit No    Medication changes reported     No    Fall or balance concerns reported    No    Tobacco Cessation No Change    Warm-up and Cool-down Performed on first and last piece of equipment    Resistance Training Performed Yes    VAD Patient? No    PAD/SET Patient? No      Pain Assessment   Currently in Pain? No/denies                Social History   Tobacco Use  Smoking Status Never  Smokeless Tobacco Never    Goals Met:  Independence with exercise equipment Exercise tolerated well No report of concerns or symptoms today Strength training completed today  Goals Unmet:  Not Applicable  Comments: Pt able to follow exercise prescription today without complaint.  Will continue to monitor for progression.    Dr. Emily Filbert is Medical Director for [facility].  Dr. Ottie Glazier is Medical Director for [facility] Pulmonary Rehabilitation.

## 2022-02-28 MED ORDER — FAMOTIDINE 20 MG TABLET
ORAL_TABLET | Freq: Two times a day (BID) | ORAL | 2 refills | 100 days | Status: CP
Start: 2022-02-28 — End: ?

## 2022-03-01 DIAGNOSIS — J849 Interstitial pulmonary disease, unspecified: Secondary | ICD-10-CM | POA: Diagnosis not present

## 2022-03-01 NOTE — Progress Notes (Signed)
Daily Session Note  Patient Details  Name: Nicole Ballard MRN: [id number] Date of Birth: 04/27/1956 Referring Provider:   Flowsheet Row Pulmonary Rehab from 02/17/2022 in [facility] Cardiac and Pulmonary Rehab  Referring Provider Layla Maw MD       Encounter Date: 03/01/2022  Check In:  Session Check In - 03/01/22 0744       Check-In   Supervising physician immediately available to respond to emergencies See telemetry face sheet for immediately available ER MD    Location [facility]-Cardiac & Pulmonary Rehab    Staff Present Tommye Standard, BS, ACSM CEP, Exercise Physiologist;Jessica Juanetta Gosling, MA, RCEP, CCRP, Tally Due, MPA, RN    Virtual Visit No    Medication changes reported     No    Fall or balance concerns reported    No    Tobacco Cessation No Change    Warm-up and Cool-down Performed on first and last piece of equipment    Resistance Training Performed Yes    VAD Patient? No    PAD/SET Patient? No      Pain Assessment   Currently in Pain? No/denies                Social History   Tobacco Use  Smoking Status Never  Smokeless Tobacco Never    Goals Met:  Independence with exercise equipment Exercise tolerated well No report of concerns or symptoms today Strength training completed today  Goals Unmet:  Not Applicable  Comments: Pt able to follow exercise prescription today without complaint.  Will continue to monitor for progression.    Dr. Bethann Punches is Medical Director for [facility] Cardiac Rehabilitation.  Dr. Vida Rigger is Medical Director for [facility] Pulmonary Rehabilitation.

## 2022-03-03 DIAGNOSIS — J849 Interstitial pulmonary disease, unspecified: Secondary | ICD-10-CM

## 2022-03-03 NOTE — Progress Notes (Signed)
Daily Session Note  Patient Details  Name: Nicole Ballard MRN: [id number] Date of Birth: February 07, 1956 Referring Provider:   Flowsheet Row Pulmonary Rehab from 02/17/2022 in [facility] Cardiac and Pulmonary Rehab  Referring Provider Despina Arias MD       Encounter Date: 03/03/2022  Check In:  Session Check In - 03/03/22 0756       Check-In   Supervising physician immediately available to respond to emergencies See telemetry face sheet for immediately available ER MD    Location [facility]-Cardiac & Pulmonary Rehab    Staff Present Justin Mend, RCP,RRT,BSRT;Melissa College, RDN, LDN;Jessica Luan Pulling, MA, RCEP, CCRP, Kathaleen Maser, MPA, RN    Virtual Visit No    Medication changes reported     No    Fall or balance concerns reported    No    Tobacco Cessation No Change    Warm-up and Cool-down Performed on first and last piece of equipment    Resistance Training Performed Yes    VAD Patient? No    PAD/SET Patient? No      Pain Assessment   Currently in Pain? No/denies                Social History   Tobacco Use  Smoking Status Never  Smokeless Tobacco Never    Goals Met:  Independence with exercise equipment Exercise tolerated well No report of concerns or symptoms today Strength training completed today  Goals Unmet:  Not Applicable  Comments: Pt able to follow exercise prescription today without complaint.  Will continue to monitor for progression.    Dr. Emily Filbert is Medical Director for [place].  Dr. Ottie Glazier is Medical Director for [facility] Pulmonary Rehabilitation.

## 2022-03-04 ENCOUNTER — Institutional Professional Consult (permissible substitution): Admit: 2022-03-04 | Discharge: 2022-03-05 | Payer: MEDICARE | Attending: Psychologist | Primary: Psychologist

## 2022-03-04 DIAGNOSIS — F411 Generalized anxiety disorder: Principal | ICD-10-CM

## 2022-03-04 DIAGNOSIS — F331 Major depressive disorder, recurrent, moderate: Principal | ICD-10-CM

## 2022-03-04 DIAGNOSIS — F410 Panic disorder [episodic paroxysmal anxiety] without agoraphobia: Principal | ICD-10-CM

## 2022-03-04 DIAGNOSIS — F4310 Post-traumatic stress disorder, unspecified: Principal | ICD-10-CM

## 2022-03-04 NOTE — Unmapped (Signed)
[facility]   Confidential Psychological Therapy Session      Patient Name: LACRETIA TINDALL  Medical Record Number: [id number]  Date of Service: March 04, 2022  Attending Psychologist: Caroline More, PhD  CPT Procedure Codes: 82956 for 60 mins of face to face counseling    This visit was performed face to face with interactive technology using a HIPPA compliant audio/visual platform. We reviewed confidentiality today. The patient was present in [place], a state in which this provider is licensed and able to provide care (location and contact information confirmed), attended this visit alone, and consented to this virtual pain psychology visit.    REFERRING PHYSICIAN: Clarene Essex, MD    CHIEF COMPLAINT AND REASON FOR VISIT: pain coping skills, CBT to address depression and anxiety in the setting of chronic pain    SUBJECTIVE / HISTORY OF PRESENT ILLNESS: Ms.  Sakamoto is a very pleasant 66 y.o.  female from [place], [place] with multiple chronic pain complaints related to fibromyalgia and lupus who initially met with me in October 2016, and which time she was diagnosed with severe depression, PTSD, panic disorder, and generalized anxiety.The patient returns for a therapy session today. Last follow up with me was 01/28/22.     Patient reports mood to be good, improved, denies any SI, and notes feeling less anxious. This post repeat LFT and diagnostic workup showing pulmonary fibrosis. Pt has started pulm rehab 2x/week and walks at 6am for an hour the other days of the week. Pt discusses how she has adjusted to the diagnosis. She also notes she wishes she could attend a support group for it to learn more and talk to others who have had it and found helpful coping strategies. I directed her to pulmonaryfibrosis.org which has support groups via zoom and also in person in the local area. Pt appreciative.     Sent the following link:  [email or web address]    Pt processes thoughts and feelings about pain and PF. Knee pain has been worse but she has a new knee brace and that seems to be helping. Pt notes that the walking seems to be helping with mood. Reviewed how and why and retouched on pacing and activity rest cycling. Spent extra time reviewing acceptance strategies - saying yes and connecting how acceptance is felt in her body, physically.       OBJECTIVE / MENTAL STATUS:    Appearance:   Appears stated age and Clean/Neat   Motor:  No abnormal movements   Speech/Language:   Normal rate, volume, tone, fluency   Mood:  Depressed and Anxious   Affect:  euthymic   Thought process:  Logical, linear, clear, coherent, goal directed   Thought content:    Denies SI, HI, self harm, delusions, obsessions, paranoid ideation, or ideas of reference   Perceptual disturbances:    Denies auditory and visual hallucinations, behavior not concerning for response to internal stimuli   Orientation:  Oriented to person, place, time, and general circumstances   Attention:  Able to fully attend without fluctuations in consciousness   Concentration:  Able to fully concentrate and attend   Memory:  Immediate, short-term, long-term, and recall grossly intact    Fund of knowledge:   Consistent with level of education and development   Insight:    Fair   Judgment:   Intact   Impulse Control:  Intact     DIAGNOSTIC IMPRESSION:   Post Traumatic Stress Disorder (PTSD)  Generalized Anxiety Disorder (  GAD)  Panic Disorder  Major Depressive Disorder, moderate, recurrent  Chronic pain syndrome  Fibromyalgia  Lupus    ASSESSMENT:   Ms.  Steptoe is a very pleasant 66 y.o.  female from [place], [place] with multiple chronic pain complaints related to lupus, arthritis, and fibromyalgia. She was previously seen in our clinic from 2012-2014, and reestablished care with Dr. Fayrene Fearing in August 2016. The patient has struggled with depression and anxiety, but is very motivated to participate in intensive, multidisciplinary treatment to address the connection between depression, anxiety and pain. She has a long-standing history of depression and anxiety, and has worked with outpatient psychiatrist and therapist over the years. She is currently established with [facility] psychiatry.  In general, depression, anxiety, pain coping and panic have all improved, but the patient evidences a seasonal affective pattern of mood changes typically her mood tends to be worse during the winter.     PLAN:   (1) Psychotherapy - Continue CBT. CBT will be used to address PTSD, MDD, Panic D/o, GAD, and chronic pain.     --Behavioral Activation - Encouraged behavioral activation.  Is trying.  --Downloaded and uses Insight Timer, guided relaxation app, for nightly practice.  --Continues to utilize diaphragmatic breathing daily  --encouraged early morning sunlight / light therapy for possible SAD in the setting of chronic MDD    (2) Psychiatry - Pt currently established with [facility]. Has established with a new resident and is followed by Dr. Evette Cristal (attending)    (3) Safety - Pt denies any current or recent SI or safety concerns. Knows to call 911 or go to her local ED. Also previously given [facility].      (4) follow-up - with me in 2 months

## 2022-03-07 ENCOUNTER — Encounter: Payer: Medicare Other | Attending: Critical Care Medicine

## 2022-03-07 DIAGNOSIS — J849 Interstitial pulmonary disease, unspecified: Secondary | ICD-10-CM | POA: Insufficient documentation

## 2022-03-07 NOTE — Progress Notes (Signed)
Completed initial RD consultation ?

## 2022-03-10 DIAGNOSIS — J849 Interstitial pulmonary disease, unspecified: Secondary | ICD-10-CM | POA: Diagnosis not present

## 2022-03-10 NOTE — Progress Notes (Signed)
Daily Session Note  Patient Details  Name: Nicole Ballard MRN: [id number] Date of Birth: [birth year] Referring Provider:   Flowsheet Row Pulmonary Rehab from 02/17/2022 in [facility] Cardiac and Pulmonary Rehab  Referring Provider Despina Arias MD       Encounter Date: 03/10/2022  Check In:  Session Check In - 03/10/22 0754       Check-In   Supervising physician immediately available to respond to emergencies See telemetry face sheet for immediately available ER MD    Location [facility]-Cardiac & Pulmonary Rehab    Staff Present Antionette Fairy, BS, Exercise Physiologist;Kelly Rosalia Hammers, MPA, RN;Melissa [place], RDN, LDN;Joseph [place], RCP,RRT,BSRT    Virtual Visit No    Medication changes reported     No    Fall or balance concerns reported    No    Tobacco Cessation No Change    Warm-up and Cool-down Performed on first and last piece of equipment    Resistance Training Performed Yes    VAD Patient? No    PAD/SET Patient? No      Pain Assessment   Currently in Pain? No/denies    Multiple Pain Sites No                Social History   Tobacco Use  Smoking Status Never  Smokeless Tobacco Never    Goals Met:  Independence with exercise equipment Exercise tolerated well No report of concerns or symptoms today Strength training completed today  Goals Unmet:  Not Applicable  Comments: Pt able to follow exercise prescription today without complaint.  Will continue to monitor for progression.    Dr. Emily Filbert is Medical Director for [place].  Dr. Ottie Glazier is Medical Director for [facility] Pulmonary Rehabilitation.

## 2022-03-15 DIAGNOSIS — J849 Interstitial pulmonary disease, unspecified: Secondary | ICD-10-CM

## 2022-03-15 NOTE — Progress Notes (Signed)
Daily Session Note  Patient Details  Name: Nicole Ballard MRN: [id number] Date of Birth: Feb 07, 1956 Referring Provider:   Flowsheet Row Pulmonary Rehab from 02/17/2022 in [facility] Cardiac and Pulmonary Rehab  Referring Provider Despina Arias MD       Encounter Date: 03/15/2022  Check In:  Session Check In - 03/15/22 0740       Check-In   Supervising physician immediately available to respond to emergencies See telemetry face sheet for immediately available ER MD    Location [facility]-Cardiac & Pulmonary Rehab    Staff Present Earlean Shawl, BS, ACSM CEP, Exercise Physiologist;Jessica Luan Pulling, MA, RCEP, CCRP, Kathaleen Maser, MPA, RN    Virtual Visit No    Medication changes reported     No    Fall or balance concerns reported    No    Tobacco Cessation No Change    Warm-up and Cool-down Performed on first and last piece of equipment    Resistance Training Performed Yes    VAD Patient? No    PAD/SET Patient? No      Pain Assessment   Currently in Pain? No/denies                Social History   Tobacco Use  Smoking Status Never  Smokeless Tobacco Never    Goals Met:  Independence with exercise equipment Exercise tolerated well No report of concerns or symptoms today Strength training completed today  Goals Unmet:  Not Applicable  Comments: Pt able to follow exercise prescription today without complaint.  Will continue to monitor for progression.  Reviewed home exercise with pt today.  Pt plans to walk and use staff videos at home for exercise.  Reviewed THR, pulse, RPE, sign and symptoms, pulse oximetery and when to call 911 or MD.  Also discussed weather considerations and indoor options.  Pt voiced understanding.   Dr. Emily Filbert is Medical Director for [place].  Dr. Ottie Glazier is Medical Director for [facility] Pulmonary Rehabilitation.

## 2022-03-16 ENCOUNTER — Encounter: Payer: Self-pay | Admitting: *Deleted

## 2022-03-16 DIAGNOSIS — J849 Interstitial pulmonary disease, unspecified: Secondary | ICD-10-CM

## 2022-03-16 NOTE — Progress Notes (Signed)
Pulmonary Individual Treatment Plan  Patient Details  Name: Nicole Ballard MRN: [id number] Date of Birth: 1956/02/02 Referring Provider:   Flowsheet Row Pulmonary Rehab from 02/17/2022 in [facility] Cardiac and Pulmonary Rehab  Referring Provider Despina Arias MD       Initial Encounter Date:  Flowsheet Row Pulmonary Rehab from 02/17/2022 in [facility] Cardiac and Pulmonary Rehab  Date 02/17/22       Visit Diagnosis: ILD (interstitial lung disease) ([place])  Patient's Home Medications on Admission:  Current Outpatient Medications:    albuterol (VENTOLIN HFA) 108 (90 Base) MCG/ACT inhaler, SMARTSIG:2 Puff(s) By Mouth Every 6 Hours PRN, Disp: , Rfl:    amLODipine (NORVASC) 2.5 MG tablet, TAKE 3 TABLETS BY MOUTH DAILY, Disp: 270 tablet, Rfl: 3   aspirin EC 81 MG tablet, Take 1 tablet (81 mg total) by mouth daily., Disp: 30 tablet, Rfl: 0   atenolol (TENORMIN) 25 MG tablet, Take 1 tablet (25 mg total) by mouth daily., Disp: 90 tablet, Rfl: 3   atorvastatin (LIPITOR) 40 MG tablet, TAKE 1 TABLET BY MOUTH  DAILY, Disp: 90 tablet, Rfl: 3   Biotin 1 MG CAPS, Take 1 capsule by mouth daily., Disp: 30 capsule, Rfl: 0   buPROPion (WELLBUTRIN XL) 300 MG 24 hr tablet, Take 300 mg by mouth in the morning., Disp: , Rfl:    Cholecalciferol (VITAMIN D) 2000 UNITS CAPS, Take 1 capsule by mouth daily., Disp: , Rfl:    clobetasol ointment (TEMOVATE) 7.86 %, Apply 1 application topically 2 (two) times daily., Disp: , Rfl:    clonazePAM (KLONOPIN) 0.5 MG tablet, Take 0.25 mg by mouth at bedtime., Disp: , Rfl:    clotrimazole-betamethasone (LOTRISONE) cream, Apply 1 application topically 2 (two) times daily., Disp: 45 g, Rfl: 0   diclofenac Sodium (VOLTAREN) 1 % GEL, Apply topically daily as needed., Disp: , Rfl:    famotidine (PEPCID) 20 MG tablet, Take by mouth., Disp: , Rfl:    fluticasone (FLONASE) 50 MCG/ACT nasal spray, Place 2 sprays into both nostrils daily., Disp: 48 g, Rfl: 3   fluticasone (FLOVENT HFA)  220 MCG/ACT inhaler, Inhale into the lungs., Disp: , Rfl:    gabapentin (NEURONTIN) 300 MG capsule, Take 300 mg (1 capsule) in the morning and at 300 mg (1 capsule) at noon and 600 mg (2 capsules) at night., Disp: , Rfl:    hydrALAZINE (APRESOLINE) 10 MG tablet, TAKE 1 TABLET BY MOUTH THREE TIMES DAILY AS NEEDED FOR BLOOD PRESSURE ABOVE 150/90, Disp: 90 tablet, Rfl: 2   hydroxychloroquine (PLAQUENIL) 200 MG tablet, Take by mouth 2 (two) times daily., Disp: , Rfl:    hydrOXYzine (ATARAX) 10 MG tablet, Take 1 tablet (10 mg total) by mouth 3 (three) times daily as needed., Disp: 30 tablet, Rfl: 0   lamoTRIgine (LAMICTAL) 200 MG tablet, Take 400 mg by mouth at bedtime., Disp: , Rfl:    LINZESS 145 MCG CAPS capsule, Take 145 mcg by mouth daily., Disp: , Rfl:    loratadine (CLARITIN) 10 MG tablet, Take 1 tablet (10 mg total) by mouth daily., Disp: 90 tablet, Rfl: 1   Melatonin 10 MG TABS, Take 1 tablet by mouth at bedtime., Disp: , Rfl:    metFORMIN (GLUCOPHAGE-XR) 500 MG 24 hr tablet, Take 1 tablet (500 mg total) by mouth daily with breakfast., Disp: 90 tablet, Rfl: 1   mycophenolate (MYFORTIC) 360 MG TBEC EC tablet, Take 720 mg by mouth 2 (two) times daily., Disp: , Rfl:    olopatadine (PATANOL) 0.1 %  ophthalmic solution, Place 1 drop into both eyes 2 (two) times daily., Disp: 5 mL, Rfl: 1   omeprazole (PRILOSEC) 40 MG capsule, TAKE 1 CAPSULE BY MOUTH  DAILY, Disp: 90 capsule, Rfl: 3   Polyethylene Glycol 3350 (MIRALAX PO), Take by mouth., Disp: , Rfl:    predniSONE (DELTASONE) 10 MG tablet, Take by mouth., Disp: , Rfl:    telmisartan (MICARDIS) 80 MG tablet, TAKE 1 TABLET BY MOUTH DAILY, Disp: 90 tablet, Rfl: 3   traZODone (DESYREL) 100 MG tablet, Take 150 mg by mouth at bedtime., Disp: , Rfl:    triamcinolone ointment (KENALOG) 0.1 %, , Disp: , Rfl:    venlafaxine (EFFEXOR) 100 MG tablet, Take 100 mg by mouth every evening., Disp: , Rfl:   Past Medical History: Past Medical History:  Diagnosis  Date   Anxiety    Bipolar 1 disorder ([place])    Chronic kidney disease    Chronic pain    Diabetes mellitus without complication (HCC)    Fibromyalgia    GERD (gastroesophageal reflux disease)    Hearing loss of right ear    Hemifacial spasm    Hyperlipidemia    Hypertension    Insomnia    Lumbago    Osteoarthritis    Paresthesia    Rhinitis    Systemic lupus erythematosus ([place])    Vitamin D deficiency     Tobacco Use: Social History   Tobacco Use  Smoking Status Never  Smokeless Tobacco Never    Labs: Review Flowsheet  More data exists      Latest Ref Rng & Units 10/09/2019 02/14/2020 06/15/2020 12/14/2020 06/15/2021  Labs for ITP Cardiac and Pulmonary Rehab  Cholestrol <200 mg/dL - - 151  - 154   LDL (calc) mg/dL (calc) - - 74  - 75   HDL-C > OR = 50 mg/dL - - 61  - 62   Trlycerides <150 mg/dL - - 78  - 89   Hemoglobin A1c <5.7 % of total Hgb 5.5  5.6  5.5  5.7  5.5      Pulmonary Assessment Scores:  Pulmonary Assessment Scores     Row Name 02/17/22 1115         ADL UCSD   ADL Phase Entry     SOB Score total 43     Rest 1     Walk 2     Stairs 3     Bath 1     Dress 3     Shop 2       CAT Score   CAT Score 26       mMRC Score   mMRC Score 2              UCSD: Self-administered rating of dyspnea associated with activities of daily living (ADLs) 6-point scale (0 = "not at all" to 5 = "maximal or unable to do because of breathlessness")  Scoring Scores range from 0 to 120.  Minimally important difference is 5 units  CAT: CAT can identify the health impairment of COPD patients and is better correlated with disease progression.  CAT has a scoring range of zero to 40. The CAT score is classified into four groups of low (less than 10), medium (10 - 20), high (21-30) and very high (31-40) based on the impact level of disease on health status. A CAT score over 10 suggests significant symptoms.  A worsening CAT score could be explained by an  exacerbation, poor medication adherence,  poor inhaler technique, or progression of COPD or comorbid conditions.  CAT MCID is 2 points  mMRC: mMRC (Modified Medical Research Council) Dyspnea Scale is used to assess the degree of baseline functional disability in patients of respiratory disease due to dyspnea. No minimal important difference is established. A decrease in score of 1 point or greater is considered a positive change.   Pulmonary Function Assessment:  Pulmonary Function Assessment - 02/17/22 1115       Breath   Shortness of Breath Yes;Panic with Shortness of Breath;Fear of Shortness of Breath;Limiting activity             Exercise Target Goals: Exercise Program Goal: Individual exercise prescription set using results from initial 6 min walk test and THRR while considering  patient's activity barriers and safety.   Exercise Prescription Goal: Initial exercise prescription builds to 30-45 minutes a day of aerobic activity, 2-3 days per week.  Home exercise guidelines will be given to patient during program as part of exercise prescription that the participant will acknowledge.  Education: Aerobic Exercise: - Group verbal and visual presentation on the components of exercise prescription. Introduces F.I.T.T principle from ACSM for exercise prescriptions.  Reviews F.I.T.T. principles of aerobic exercise including progression. Written material given at graduation. Flowsheet Row Pulmonary Rehab from 03/10/2022 in [facility] Cardiac and Pulmonary Rehab  Date 03/10/22  Educator [facility]  Instruction Review Code 1- Verbalizes Understanding       Education: Resistance Exercise: - Group verbal and visual presentation on the components of exercise prescription. Introduces F.I.T.T principle from ACSM for exercise prescriptions  Reviews F.I.T.T. principles of resistance exercise including progression. Written material given at graduation.    Education: Exercise & Equipment Safety: -  Individual verbal instruction and demonstration of equipment use and safety with use of the equipment. Flowsheet Row Pulmonary Rehab from 03/10/2022 in [facility] Cardiac and Pulmonary Rehab  Date 02/17/22  Educator [facility]  Instruction Review Code 1- Verbalizes Understanding       Education: Exercise Physiology & General Exercise Guidelines: - Group verbal and written instruction with models to review the exercise physiology of the cardiovascular system and associated critical values. Provides general exercise guidelines with specific guidelines to those with heart or lung disease.  Flowsheet Row Pulmonary Rehab from 03/10/2022 in [facility] Cardiac and Pulmonary Rehab  Date 03/03/22  Educator NT  Instruction Review Code 1- [employer] Understanding       Education: Flexibility, Balance, Mind/Body Relaxation: - Group verbal and visual presentation with interactive activity on the components of exercise prescription. Introduces F.I.T.T principle from ACSM for exercise prescriptions. Reviews F.I.T.T. principles of flexibility and balance exercise training including progression. Also discusses the mind body connection.  Reviews various relaxation techniques to help reduce and manage stress (i.e. Deep breathing, progressive muscle relaxation, and visualization). Balance handout provided to take home. Written material given at graduation.   Activity Barriers & Risk Stratification:  Activity Barriers & Cardiac Risk Stratification - 02/17/22 1048       Activity Barriers & Cardiac Risk Stratification   Activity Barriers Right Knee Replacement;Muscular Weakness;Deconditioning;Balance Concerns;Shortness of Breath             6 Minute Walk:  6 Minute Walk     Row Name 02/17/22 1045         6 Minute Walk   Phase Initial     Distance 1320 feet     Walk Time 6 minutes     # of Rest Breaks 0  MPH 2.5     METS 2.72     RPE 11     Perceived Dyspnea  1     VO2 Peak 9.52     Symptoms Yes (comment)      Comments SOB, dizzy on turns     Resting HR 68 bpm     Resting BP 136/64     Resting Oxygen Saturation  96 %     Exercise Oxygen Saturation  during 6 min walk 85 %     Max Ex. HR 103 bpm     Max Ex. BP 134/72     2 Minute Post BP 132/62       Interval HR   1 Minute HR 86     2 Minute HR 95     3 Minute HR 103     4 Minute HR 102     5 Minute HR 102     6 Minute HR 103     2 Minute Post HR 73     Interval Heart Rate? Yes       Interval Oxygen   Interval Oxygen? Yes     Baseline Oxygen Saturation % 96 %     1 Minute Oxygen Saturation % 89 %     1 Minute Liters of Oxygen 0 L  Room Air     2 Minute Oxygen Saturation % 87 %     2 Minute Liters of Oxygen 0 L     3 Minute Oxygen Saturation % 86 %     3 Minute Liters of Oxygen 0 L     4 Minute Oxygen Saturation % 85 %     4 Minute Liters of Oxygen 0 L     5 Minute Oxygen Saturation % 87 %     5 Minute Liters of Oxygen 0 L     6 Minute Oxygen Saturation % 86 %     6 Minute Liters of Oxygen 0 L     2 Minute Post Oxygen Saturation % 95 %     2 Minute Post Liters of Oxygen 0 L             Oxygen Initial Assessment:  Oxygen Initial Assessment - 02/17/22 1115       Home Oxygen   Home Oxygen Device None    Sleep Oxygen Prescription None    Home Exercise Oxygen Prescription None    Home Resting Oxygen Prescription None    Compliance with Home Oxygen Use Yes      Initial 6 min Walk   Oxygen Used None      Program Oxygen Prescription   Program Oxygen Prescription None      Intervention   Short Term Goals To learn and demonstrate proper pursed lip breathing techniques or other breathing techniques. ;To learn and demonstrate proper use of respiratory medications;To learn and understand importance of maintaining oxygen saturations>88%;To learn and understand importance of monitoring SPO2 with pulse oximeter and demonstrate accurate use of the pulse oximeter.    Long  Term Goals Exhibits proper breathing techniques, such as  pursed lip breathing or other method taught during program session;Demonstrates proper use of MDI's;Compliance with respiratory medication;Maintenance of O2 saturations>88%;Verbalizes importance of monitoring SPO2 with pulse oximeter and return demonstration             Oxygen Re-Evaluation:  Oxygen Re-Evaluation     Row Name 02/22/22 [phone] 03/15/22 [phone]  Program Oxygen Prescription   Program Oxygen Prescription None None        Home Oxygen   Home Oxygen Device None None      Sleep Oxygen Prescription None None      Home Exercise Oxygen Prescription None None      Home Resting Oxygen Prescription None None      Compliance with Home Oxygen Use Yes Yes        Goals/Expected Outcomes   Short Term Goals To learn and demonstrate proper pursed lip breathing techniques or other breathing techniques. ;To learn and understand importance of maintaining oxygen saturations>88%;To learn and understand importance of monitoring SPO2 with pulse oximeter and demonstrate accurate use of the pulse oximeter. To learn and demonstrate proper pursed lip breathing techniques or other breathing techniques. ;To learn and understand importance of maintaining oxygen saturations>88%;To learn and understand importance of monitoring SPO2 with pulse oximeter and demonstrate accurate use of the pulse oximeter.      Long  Term Goals Exhibits proper breathing techniques, such as pursed lip breathing or other method taught during program session;Compliance with respiratory medication;Maintenance of O2 saturations>88%;Verbalizes importance of monitoring SPO2 with pulse oximeter and return demonstration Exhibits proper breathing techniques, such as pursed lip breathing or other method taught during program session;Compliance with respiratory medication;Maintenance of O2 saturations>88%;Verbalizes importance of monitoring SPO2 with pulse oximeter and return demonstration      Comments Reviewed PLB technique with pt.   Talked about how it works and it's importance in maintaining their exercise saturations. Nicole Ballard is doing well in rehab. She is getting better at PLB and using it more often. She is good about keeping an eye on her breathing at home.  She has ordered a pulse oximeter for home use.      Goals/Expected Outcomes Short: Become more profiecient at using PLB.   Long: Become independent at using PLB. Short; Use pulse oximeter at home Long: Conitnue to improve PLB               Oxygen Discharge (Final Oxygen Re-Evaluation):  Oxygen Re-Evaluation - 03/15/22 0826       Program Oxygen Prescription   Program Oxygen Prescription None      Home Oxygen   Home Oxygen Device None    Sleep Oxygen Prescription None    Home Exercise Oxygen Prescription None    Home Resting Oxygen Prescription None    Compliance with Home Oxygen Use Yes      Goals/Expected Outcomes   Short Term Goals To learn and demonstrate proper pursed lip breathing techniques or other breathing techniques. ;To learn and understand importance of maintaining oxygen saturations>88%;To learn and understand importance of monitoring SPO2 with pulse oximeter and demonstrate accurate use of the pulse oximeter.    Long  Term Goals Exhibits proper breathing techniques, such as pursed lip breathing or other method taught during program session;Compliance with respiratory medication;Maintenance of O2 saturations>88%;Verbalizes importance of monitoring SPO2 with pulse oximeter and return demonstration    Comments Nicole Ballard is doing well in rehab. She is getting better at PLB and using it more often. She is good about keeping an eye on her breathing at home.  She has ordered a pulse oximeter for home use.    Goals/Expected Outcomes Short; Use pulse oximeter at home Long: Conitnue to improve PLB             Initial Exercise Prescription:  Initial Exercise Prescription - 02/17/22 1000       Date of  Initial Exercise RX and Referring Provider    Date 02/17/22    Referring Provider Despina Arias MD      Oxygen   Maintain Oxygen Saturation 88% or higher      Treadmill   MPH 2.2    Grade 1    Minutes 15    METs 2.99      Recumbant Bike   Level 3    RPM 50    Watts 25    Minutes 15    METs 3      NuStep   Level 3    SPM 80    Minutes 15    METs 3      Track   Laps 35    Minutes 15    METs 2.9      Prescription Details   Frequency (times per week) 2    Duration Progress to 30 minutes of continuous aerobic without signs/symptoms of physical distress      Intensity   THRR 40-80% of Max Heartrate 103-138    Ratings of Perceived Exertion 11-13    Perceived Dyspnea 0-4      Progression   Progression Continue to progress workloads to maintain intensity without signs/symptoms of physical distress.      Resistance Training   Training Prescription Yes    Weight 3 lb    Reps 10-15             Perform Capillary Blood Glucose checks as needed.  Exercise Prescription Changes:   Exercise Prescription Changes     Row Name 02/17/22 1000 03/01/22 0700 03/15/22 0800         Response to Exercise   Blood Pressure (Admit) 136/64 132/82 112/60     Blood Pressure (Exercise) 134/72 142/72 132/70     Blood Pressure (Exit) 122/62 128/72 126/64     Heart Rate (Admit) 68 bpm 83 bpm 71 bpm     Heart Rate (Exercise) 103 bpm 94 bpm 87 bpm     Heart Rate (Exit) 75 bpm 79 bpm 85 bpm     Oxygen Saturation (Admit) 96 % 94 % 95 %     Oxygen Saturation (Exercise) 85 % 91 % 92 %     Oxygen Saturation (Exit) 95 % 93 % 90 %     Rating of Perceived Exertion (Exercise) _0 Perceived Dyspnea (Exercise) _1 Symptoms SOB, dizzy on turns SOB SOB     Comments walk test results 2nd full day of exercise --     Duration -- Progress to 30 minutes of  aerobic without signs/symptoms of physical distress Continue with 30 min of aerobic exercise without signs/symptoms of physical distress.     Intensity -- THRR unchanged  THRR unchanged       Progression   Progression -- Continue to progress workloads to maintain intensity without signs/symptoms of physical distress. Continue to progress workloads to maintain intensity without signs/symptoms of physical distress.     Average METs -- 2.95 3.63       Resistance Training   Training Prescription -- Yes Yes     Weight -- 3 lb 3 lb     Reps -- 10-15 10-15       Interval Training   Interval Training -- No No       Treadmill   MPH -- 2.2 3     Grade -- 1 1     Minutes --  15 15     METs -- 2.99 3.71       Recumbant Bike   Level -- 3 2     Watts -- 25 --     Minutes -- 15 15     METs -- 2.69 3.1       NuStep   Level -- 3 3     Minutes -- 15 15     METs -- 3.6 4.1       Home Exercise Plan   Plans to continue exercise at -- -- Home (comment)  walking, staff videos     Frequency -- -- Add 3 additional days to program exercise sessions.     Initial Home Exercises Provided -- -- 03/15/22       Oxygen   Maintain Oxygen Saturation -- 88% or higher 88% or higher              Exercise Comments:   Exercise Comments     Row Name 02/22/22 1030           Exercise Comments First full day of exercise!  Patient was oriented to gym and equipment including functions, settings, policies, and procedures.  Patient's individual exercise prescription and treatment plan were reviewed.  All starting workloads were established based on the results of the 6 minute walk test done at initial orientation visit.  The plan for exercise progression was also introduced and progression will be customized based on patient's performance and goals.                Exercise Goals and Review:   Exercise Goals     Row Name 02/17/22 1112             Exercise Goals   Increase Physical Activity Yes       Intervention Provide advice, education, support and counseling about physical activity/exercise needs.;Develop an individualized exercise prescription for aerobic  and resistive training based on initial evaluation findings, risk stratification, comorbidities and participant's personal goals.       Expected Outcomes Short Term: Attend rehab on a regular basis to increase amount of physical activity.;Long Term: Add in home exercise to make exercise part of routine and to increase amount of physical activity.;Long Term: Exercising regularly at least 3-5 days a week.       Increase Strength and Stamina Yes       Intervention Provide advice, education, support and counseling about physical activity/exercise needs.;Develop an individualized exercise prescription for aerobic and resistive training based on initial evaluation findings, risk stratification, comorbidities and participant's personal goals.       Expected Outcomes Short Term: Increase workloads from initial exercise prescription for resistance, speed, and METs.;Short Term: Perform resistance training exercises routinely during rehab and add in resistance training at home;Long Term: Improve cardiorespiratory fitness, muscular endurance and strength as measured by increased METs and functional capacity (6MWT)       Able to understand and use rate of perceived exertion (RPE) scale Yes       Intervention Provide education and explanation on how to use RPE scale       Expected Outcomes Short Term: Able to use RPE daily in rehab to express subjective intensity level;Long Term:  Able to use RPE to guide intensity level when exercising independently       Able to understand and use Dyspnea scale Yes       Intervention Provide education and explanation on how to use Dyspnea scale  Expected Outcomes Short Term: Able to use Dyspnea scale daily in rehab to express subjective sense of shortness of breath during exertion;Long Term: Able to use Dyspnea scale to guide intensity level when exercising independently       Knowledge and understanding of Target Heart Rate Range (THRR) Yes       Intervention Provide education  and explanation of THRR including how the numbers were predicted and where they are located for reference       Expected Outcomes Short Term: Able to state/look up THRR;Short Term: Able to use daily as guideline for intensity in rehab;Long Term: Able to use THRR to govern intensity when exercising independently       Able to check pulse independently Yes       Intervention Provide education and demonstration on how to check pulse in carotid and radial arteries.;Review the importance of being able to check your own pulse for safety during independent exercise       Expected Outcomes Short Term: Able to explain why pulse checking is important during independent exercise;Long Term: Able to check pulse independently and accurately       Understanding of Exercise Prescription Yes       Intervention Provide education, explanation, and written materials on patient's individual exercise prescription       Expected Outcomes Short Term: Able to explain program exercise prescription;Long Term: Able to explain home exercise prescription to exercise independently                Exercise Goals Re-Evaluation :  Exercise Goals Re-Evaluation     Row Name 02/22/22 0752 03/01/22 0759 03/15/22 0805         Exercise Goal Re-Evaluation   Exercise Goals Review Increase Physical Activity;Able to understand and use rate of perceived exertion (RPE) scale;Knowledge and understanding of Target Heart Rate Range (THRR);Understanding of Exercise Prescription;Increase Strength and Stamina;Able to check pulse independently;Able to understand and use Dyspnea scale Increase Physical Activity;Increase Strength and Stamina;Understanding of Exercise Prescription Increase Physical Activity;Increase Strength and Stamina;Understanding of Exercise Prescription;Able to understand and use rate of perceived exertion (RPE) scale;Able to understand and use Dyspnea scale;Knowledge and understanding of Target Heart Rate Range (THRR);Able to  check pulse independently     Comments Reviewed RPE and dyspnea scales, THR and program prescription with pt today.  Pt voiced understanding and was given a copy of goals to take home. Nicole Ballard is doing well to start rehab. She has tolerated level 3 on the T4. She has also done well on the treadmill at 2.2 mph. We will continue to monitor her progress in the program. Nicole Ballard is doing well in rehab.  She is already walking on the days she is not in class at least three times a week.  Reviewed home exercise with pt today.  Pt plans to walk and use staff videos at home for exercise.  Reviewed THR, pulse, RPE, sign and symptoms, pulse oximetery and when to call 911 or MD.  Also discussed weather considerations and indoor options.  Pt voiced understanding.     Expected Outcomes Short: Use RPE daily to regulate intensity. Long: Follow program prescription in THR. Short: continue to increase treadmill speed. Long: Continue to increase MET levels. Short: Continue to walk and try staff videos Long: Conitnue to exercise independently              Discharge Exercise Prescription (Final Exercise Prescription Changes):  Exercise Prescription Changes - 03/15/22 0800  Response to Exercise   Blood Pressure (Admit) 112/60    Blood Pressure (Exercise) 132/70    Blood Pressure (Exit) 126/64    Heart Rate (Admit) 71 bpm    Heart Rate (Exercise) 87 bpm    Heart Rate (Exit) 85 bpm    Oxygen Saturation (Admit) 95 %    Oxygen Saturation (Exercise) 92 %    Oxygen Saturation (Exit) 90 %    Rating of Perceived Exertion (Exercise) 15    Perceived Dyspnea (Exercise) 1    Symptoms SOB    Duration Continue with 30 min of aerobic exercise without signs/symptoms of physical distress.    Intensity THRR unchanged      Progression   Progression Continue to progress workloads to maintain intensity without signs/symptoms of physical distress.    Average METs 3.63      Resistance Training   Training Prescription Yes     Weight 3 lb    Reps 10-15      Interval Training   Interval Training No      Treadmill   MPH 3    Grade 1    Minutes 15    METs 3.71      Recumbant Bike   Level 2    Minutes 15    METs 3.1      NuStep   Level 3    Minutes 15    METs 4.1      Home Exercise Plan   Plans to continue exercise at Home (comment)   walking, staff videos   Frequency Add 3 additional days to program exercise sessions.    Initial Home Exercises Provided 03/15/22      Oxygen   Maintain Oxygen Saturation 88% or higher             Nutrition:  Target Goals: Understanding of nutrition guidelines, daily intake of sodium <154m, cholesterol <2065m calories 30% from fat and 7% or less from saturated fats, daily to have 5 or more servings of fruits and vegetables.  Education: All About Nutrition: -Group instruction provided by verbal, written material, interactive activities, discussions, models, and posters to present general guidelines for heart healthy nutrition including fat, fiber, MyPlate, the role of sodium in heart healthy nutrition, utilization of the nutrition label, and utilization of this knowledge for meal planning. Follow up email sent as well. Written material given at graduation.   Biometrics:  Pre Biometrics - 02/17/22 1112       Pre Biometrics   Height _0  (1.727 m)    Weight 247 lb 12.8 oz (112.4 kg)    BMI (Calculated) 37.69    Single Leg Stand 4.4 seconds              Nutrition Therapy Plan and Nutrition Goals:  Nutrition Therapy & Goals - 03/07/22 0816       Nutrition Therapy   Diet Heart healthy, low Na, pulmonary MNT    Drug/Food Interactions Statins/Certain Fruits    Protein (specify units) 100g   x1.2/kg adjusted body weight   Fiber 25 grams    Whole Grain Foods 3 servings    Saturated Fats 16 max. grams    Fruits and Vegetables 8 servings/day    Sodium 2 grams      Personal Nutrition Goals   Nutrition Goal ST: try whole wheat bread, try out low  prep meals follow MyPlate LT: Follow MyPlate guidelines, 10163Arotein/day, 25g fiber/day    Comments 89.o. F admitted to pulmonary rehab  for ILD. PMHx includes connective tissue disease, HTN, GERD, systemic lupus erythematosus, fibromyalgia, burning mouth syndrome, anxiety, PTSD. Relevant medications includes lipitor, biotin, wellbutrin, lamotrigine, vit D, klonopin, hydroxychloroquine, linzess, melatonin, metformin, omeprazole, miralax, prednisone, trazadone, venlafaxine. PYP Score: 62. Vegetables & Fruits 6/12. Breads, Grains & Cereals 7/12. Red & Processed Meat 12/12. Poultry 0/2. Fish & Shellfish 0/4. Beans, Nuts & Seeds 1/4. Milk & Dairy Foods 5/6. Toppings, Oils, Seasonings & Salt 17/20. Sweets, Snacks & Restaurant Food 6/14. Beverages 8/10.   She does not cook much. B: strawberries, breakfast bar L: peanut butter and banana sandwich S: fruit (grapes) D: chicken salad sandwich, chili, baked chicken. She uses honey wheat bread, she will sometimes cook food and then freeze them such as chilli and beans, she likes sweet potato fries. Drinks: water. Discussed heart healthy eating and pulmonary MNT.      Intervention Plan   Intervention Prescribe, educate and counsel regarding individualized specific dietary modifications aiming towards targeted core components such as weight, hypertension, lipid management, diabetes, heart failure and other comorbidities.    Expected Outcomes Short Term Goal: Understand basic principles of dietary content, such as calories, fat, sodium, cholesterol and nutrients.;Short Term Goal: A plan has been developed with personal nutrition goals set during dietitian appointment.;Long Term Goal: Adherence to prescribed nutrition plan.             Nutrition Assessments:  MEDIFICTS Score Key: ?70 Need to make dietary changes  40-70 Heart Healthy Diet ? 40 Therapeutic Level Cholesterol Diet  Flowsheet Row Pulmonary Rehab from 02/17/2022 in [facility] Cardiac and Pulmonary Rehab   Picture Your Plate Total Score on Admission 62      Picture Your Plate Scores: <75 Unhealthy dietary pattern with much room for improvement. 41-50 Dietary pattern unlikely to meet recommendations for good health and room for improvement. 51-60 More healthful dietary pattern, with some room for improvement.  >60 Healthy dietary pattern, although there may be some specific behaviors that could be improved.   Nutrition Goals Re-Evaluation:   Nutrition Goals Discharge (Final Nutrition Goals Re-Evaluation):   Psychosocial: Target Goals: Acknowledge presence or absence of significant depression and/or stress, maximize coping skills, provide positive support system. Participant is able to verbalize types and ability to use techniques and skills needed for reducing stress and depression.   Education: Stress, Anxiety, and Depression - Group verbal and visual presentation to define topics covered.  Reviews how body is impacted by stress, anxiety, and depression.  Also discusses healthy ways to reduce stress and to treat/manage anxiety and depression.  Written material given at graduation. Flowsheet Row Pulmonary Rehab from 03/10/2022 in [facility] Cardiac and Pulmonary Rehab  Date 02/24/22  Educator [facility]  Instruction Review Code 1- [employer] Understanding       Education: Sleep Hygiene -Provides group verbal and written instruction about how sleep can affect your health.  Define sleep hygiene, discuss sleep cycles and impact of sleep habits. Review good sleep hygiene tips.    Initial Review & Psychosocial Screening:  Initial Psych Review & Screening - 02/02/22 1421       Initial Review   Current issues with Current Psychotropic Meds;Current Depression;Current Anxiety/Panic;Current Stress Concerns    Source of Stress Concerns Chronic Illness    Comments several health issues can cause her to feel down. Tries to focus on the positive.      Family Dynamics   Good Support System? Yes   son and  daughter, friend     Barriers   Psychosocial barriers  to participate in program There are no identifiable barriers or psychosocial needs.;The patient should benefit from training in stress management and relaxation.      Screening Interventions   Interventions Encouraged to exercise;To provide support and resources with identified psychosocial needs;Provide feedback about the scores to participant    Expected Outcomes Short Term goal: Utilizing psychosocial counselor, staff and physician to assist with identification of specific Stressors or current issues interfering with healing process. Setting desired goal for each stressor or current issue identified.;Long Term Goal: Stressors or current issues are controlled or eliminated.;Short Term goal: Identification and review with participant of any Quality of Life or Depression concerns found by scoring the questionnaire.;Long Term goal: The participant improves quality of Life and PHQ9 Scores as seen by post scores and/or verbalization of changes             Quality of Life Scores:  Scores of 19 and below usually indicate a poorer quality of life in these areas.  A difference of  2-3 points is a clinically meaningful difference.  A difference of 2-3 points in the total score of the Quality of Life Index has been associated with significant improvement in overall quality of life, self-image, physical symptoms, and general health in studies assessing change in quality of life.  PHQ-9: Review Flowsheet  More data exists      03/15/2022 02/17/2022 12/20/2021 10/01/2021 09/07/2021  Depression screen PHQ 2/9  Decreased Interest 1 1 0 0 0  Down, Depressed, Hopeless 1 1 0 0 0  PHQ - 2 Score 2 2 0 0 0  Altered sleeping 2 2 0 0 -  Tired, decreased energy 1 1 0 0 -  Change in appetite 0 0 0 0 -  Feeling bad or failure about yourself  _0 0 -  Trouble concentrating 0 2 0 0 -  Moving slowly or fidgety/restless 1 1 0 0 -  Suicidal thoughts 0 0 0 0 -   PHQ-9 Score _1 0 -  Difficult doing work/chores Somewhat difficult Somewhat difficult - - -   Interpretation of Total Score  Total Score Depression Severity:  1-4 = Minimal depression, 5-9 = Mild depression, 10-14 = Moderate depression, 15-19 = Moderately severe depression, 20-27 = Severe depression   Psychosocial Evaluation and Intervention:  Psychosocial Evaluation - 02/02/22 1435       Psychosocial Evaluation & Interventions   Interventions Encouraged to exercise with the program and follow exercise prescription    Comments Boese has no barriers to attending the program. She lives alone. She has a son and a daughter that are her support along with her firends. She is on meds for depression and anxiety. She has multiple illnesses and it can bring her down at times. She tries hard to look at the bright side to prevent being pulled down. She wants to see if she can help her lungs heal as much as possible. She is reday to get started.    Expected Outcomes STG Nicole Ballard is able to attend all scheduled sessions. She learns rlaxation and de stress exercises while here. LTG Nicole Ballard is able to continue her exercise progression and manage her stress and depression easier.    Continue Psychosocial Services  Follow up required by staff             Psychosocial Re-Evaluation:  Psychosocial Re-Evaluation     [place] Name 03/15/22 0813             Psychosocial Re-Evaluation  Current issues with Current Psychotropic Meds;History of Depression;Current Anxiety/Panic;Current Depression;Current Sleep Concerns       Comments Nicole Ballard is doing well mentally.  She feels that she is doing a little better with her depression currently as she is is just feeling better overall.  Her PHQ has improved by 2 pts over last couple of weeks. She has been struggling with sleep recently for both going to sleep and waking up.  She lays in bed for a while before going to sleep.  She wakes early and unable to go back to  sleep.  She takes melatonin each nigh in tablet form.  She is going to try to up her dose some and take 30 min before bed.   She is feeling well managed on her meds.  She does get frustrated with feeling like she still gets SOB.       Expected Outcomes Short: Try to increase melatonin to help with sleep Long: Continue to focus on positive and exercies for mental boost.       Interventions Encouraged to attend Pulmonary Rehabilitation for the exercise;Stress management education       Continue Psychosocial Services  Follow up required by staff                Psychosocial Discharge (Final Psychosocial Re-Evaluation):  Psychosocial Re-Evaluation - 03/15/22 0813       Psychosocial Re-Evaluation   Current issues with Current Psychotropic Meds;History of Depression;Current Anxiety/Panic;Current Depression;Current Sleep Concerns    Comments Nicole Ballard is doing well mentally.  She feels that she is doing a little better with her depression currently as she is is just feeling better overall.  Her PHQ has improved by 2 pts over last couple of weeks. She has been struggling with sleep recently for both going to sleep and waking up.  She lays in bed for a while before going to sleep.  She wakes early and unable to go back to sleep.  She takes melatonin each nigh in tablet form.  She is going to try to up her dose some and take 30 min before bed.   She is feeling well managed on her meds.  She does get frustrated with feeling like she still gets SOB.    Expected Outcomes Short: Try to increase melatonin to help with sleep Long: Continue to focus on positive and exercies for mental boost.    Interventions Encouraged to attend Pulmonary Rehabilitation for the exercise;Stress management education    Continue Psychosocial Services  Follow up required by staff             Education: Education Goals: Education classes will be provided on a weekly basis, covering required topics. Participant will state  understanding/return demonstration of topics presented.  Learning Barriers/Preferences:   General Pulmonary Education Topics:  Infection Prevention: - Provides verbal and written material to individual with discussion of infection control including proper hand washing and proper equipment cleaning during exercise session. Flowsheet Row Pulmonary Rehab from 03/10/2022 in [facility] Cardiac and Pulmonary Rehab  Date 02/17/22  Educator [facility]  Instruction Review Code 1- Verbalizes Understanding       Falls Prevention: - Provides verbal and written material to individual with discussion of falls prevention and safety. Flowsheet Row Pulmonary Rehab from 03/10/2022 in [facility] Cardiac and Pulmonary Rehab  Date 02/02/22  Educator SB  Instruction Review Code 1- Verbalizes Understanding       Chronic Lung Disease Review: - Group verbal instruction with posters, models, PowerPoint presentations and  videos,  to review new updates, new respiratory medications, new advancements in procedures and treatments. Providing information on websites and "800" numbers for continued self-education. Includes information about supplement oxygen, available portable oxygen systems, continuous and intermittent flow rates, oxygen safety, concentrators, and Medicare reimbursement for oxygen. Explanation of Pulmonary Drugs, including class, frequency, complications, importance of spacers, rinsing mouth after steroid MDI's, and proper cleaning methods for nebulizers. Review of basic lung anatomy and physiology related to function, structure, and complications of lung disease. Review of risk factors. Discussion about methods for diagnosing sleep apnea and types of masks and machines for OSA. Includes a review of the use of types of environmental controls: home humidity, furnaces, filters, dust mite/pet prevention, HEPA vacuums. Discussion about weather changes, air quality and the benefits of nasal washing. Instruction on Warning signs,  infection symptoms, calling MD promptly, preventive modes, and value of vaccinations. Review of effective airway clearance, coughing and/or vibration techniques. Emphasizing that all should Create an Action Plan. Written material given at graduation. Flowsheet Row Pulmonary Rehab from 03/10/2022 in [facility] Cardiac and Pulmonary Rehab  Education need identified 02/17/22  Date 02/17/22  Educator [facility]  Instruction Review Code 1- Verbalizes Understanding       AED/CPR: - Group verbal and written instruction with the use of models to demonstrate the basic use of the AED with the basic ABC's of resuscitation.    Anatomy and Cardiac Procedures: - Group verbal and visual presentation and models provide information about basic cardiac anatomy and function. Reviews the testing methods done to diagnose heart disease and the outcomes of the test results. Describes the treatment choices: Medical Management, Angioplasty, or Coronary Bypass Surgery for treating various heart conditions including Myocardial Infarction, Angina, Valve Disease, and Cardiac Arrhythmias.  Written material given at graduation.   Medication Safety: - Group verbal and visual instruction to review commonly prescribed medications for heart and lung disease. Reviews the medication, class of the drug, and side effects. Includes the steps to properly store meds and maintain the prescription regimen.  Written material given at graduation.   Other: -Provides group and verbal instruction on various topics (see comments)   Knowledge Questionnaire Score:  Knowledge Questionnaire Score - 02/17/22 1114       Knowledge Questionnaire Score   Pre Score 12/18              Core Components/Risk Factors/Patient Goals at Admission:  Personal Goals and Risk Factors at Admission - 02/17/22 1114       Core Components/Risk Factors/Patient Goals on Admission    Weight Management Yes;Weight Loss;Obesity    Intervention Weight Management:  Develop a combined nutrition and exercise program designed to reach desired caloric intake, while maintaining appropriate intake of nutrient and fiber, sodium and fats, and appropriate energy expenditure required for the weight goal.;Weight Management/Obesity: Establish reasonable short term and long term weight goals.;Obesity: Provide education and appropriate resources to help participant work on and attain dietary goals.;Weight Management: Provide education and appropriate resources to help participant work on and attain dietary goals.    Admit Weight 247 lb 12.8 oz (112.4 kg)    Goal Weight: Short Term 243 lb (110.2 kg)    Goal Weight: Long Term 200 lb (90.7 kg)    Expected Outcomes Short Term: Continue to assess and modify interventions until short term weight is achieved;Weight Loss: Understanding of general recommendations for a balanced deficit meal plan, which promotes 1-2 lb weight loss per week and includes a negative energy balance of [phone]  kcal/d;Understanding recommendations for meals to include 15-35% energy as protein, 25-35% energy from fat, 35-60% energy from carbohydrates, less than 264m of dietary cholesterol, 20-35 gm of total fiber daily;Understanding of distribution of calorie intake throughout the day with the consumption of 4-5 meals/snacks    Improve shortness of breath with ADL's Yes    Intervention Provide education, individualized exercise plan and daily activity instruction to help decrease symptoms of SOB with activities of daily living.    Expected Outcomes Short Term: Improve cardiorespiratory fitness to achieve a reduction of symptoms when performing ADLs;Long Term: Be able to perform more ADLs without symptoms or delay the onset of symptoms    Increase knowledge of respiratory medications and ability to use respiratory devices properly  Yes    Intervention Provide education and demonstration as needed of appropriate use of medications, inhalers, and oxygen therapy.     Expected Outcomes Short Term: Achieves understanding of medications use. Understands that oxygen is a medication prescribed by physician. Demonstrates appropriate use of inhaler and oxygen therapy.;Long Term: Maintain appropriate use of medications, inhalers, and oxygen therapy.    Hypertension Yes    Intervention Provide education on lifestyle modifcations including regular physical activity/exercise, weight management, moderate sodium restriction and increased consumption of fresh fruit, vegetables, and low fat dairy, alcohol moderation, and smoking cessation.;Monitor prescription use compliance.    Expected Outcomes Short Term: Continued assessment and intervention until BP is < 140/958mHG in hypertensive participants. < 130/804mG in hypertensive participants with diabetes, heart failure or chronic kidney disease.;Long Term: Maintenance of blood pressure at goal levels.    Lipids Yes    Intervention Provide education and support for participant on nutrition & aerobic/resistive exercise along with prescribed medications to achieve LDL <24m33mDL >40mg47m Expected Outcomes Short Term: Participant states understanding of desired cholesterol values and is compliant with medications prescribed. Participant is following exercise prescription and nutrition guidelines.;Long Term: Cholesterol controlled with medications as prescribed, with individualized exercise RX and with personalized nutrition plan. Value goals: LDL < 24mg,68m > 40 mg.             Education:Diabetes - Individual verbal and written instruction to review signs/symptoms of diabetes, desired ranges of glucose level fasting, after meals and with exercise. Acknowledge that pre and post exercise glucose checks will be done for 3 sessions at entry of program.   Know Your Numbers and Heart Failure: - Group verbal and visual instruction to discuss disease risk factors for cardiac and pulmonary disease and treatment options.  Reviews  associated critical values for Overweight/Obesity, Hypertension, Cholesterol, and Diabetes.  Discusses basics of heart failure: signs/symptoms and treatments.  Introduces Heart Failure Zone chart for action plan for heart failure.  Written material given at graduation.   Core Components/Risk Factors/Patient Goals Review:   Goals and Risk Factor Review     Row Name 03/15/22 0823             Core Components/Risk Factors/Patient Goals Review   Personal Goals Review Weight Management/Obesity;Increase knowledge of respiratory medications and ability to use respiratory devices properly.;Improve shortness of breath with ADL's;Hypertension;Lipids       Review SandraRubieing well in rehab.  Her weight is trending down.  She is down to 246 lb today.  She is still getting SOB some days.  Today, was the first day that she felt winded before class and during class, so she knows that it is getting better overall.  She is on prednisone  and doing well with her inhalers to help her breathing.  Her pressures continue to do well and she is checking them three times a week at home.       Expected Outcomes Short: Continue to work on breathing Long: Conitnue to work on [employer] loss                Core Components/Risk Factors/Patient Goals at Discharge (Final Review):   Goals and Risk Factor Review - 03/15/22 0823       Core Components/Risk Factors/Patient Goals Review   Personal Goals Review Weight Management/Obesity;Increase knowledge of respiratory medications and ability to use respiratory devices properly.;Improve shortness of breath with ADL's;Hypertension;Lipids    Review Nicole Ballard is doing well in rehab.  Her weight is trending down.  She is down to 246 lb today.  She is still getting SOB some days.  Today, was the first day that she felt winded before class and during class, so she knows that it is getting better overall.  She is on prednisone and doing well with her inhalers to help her breathing.  Her  pressures continue to do well and she is checking them three times a week at home.    Expected Outcomes Short: Continue to work on breathing Long: Conitnue to work on weight loss             ITP Comments:  ITP Comments     Row Name 02/02/22 1433 02/17/22 1044 02/22/22 0752 03/07/22 0816 03/16/22 0953   ITP Comments Virtual orientation call completed today. shehas an appointment on Date: 02/09/2022  for EP eval and gym Orientation.  Documentation of diagnosis can be found in [facility] Date: 12/29/2021 . Completed 6MWT and gym orientation. Initial ITP created and sent for review to Dr. Zetta Bills, Medical Director. First full day of exercise!  Patient was oriented to gym and equipment including functions, settings, policies, and procedures.  Patient's individual exercise prescription and treatment plan were reviewed.  All starting workloads were established based on the results of the 6 minute walk test done at initial orientation visit.  The plan for exercise progression was also introduced and progression will be customized based on patient's performance and goals. Completed initial RD consultation 30 Day review completed. Medical Director ITP review done, changes made as directed, and signed approval by Medical Director.            Comments:

## 2022-03-22 DIAGNOSIS — J849 Interstitial pulmonary disease, unspecified: Secondary | ICD-10-CM

## 2022-03-22 NOTE — Progress Notes (Signed)
Daily Session Note  Patient Details  Name: Nicole Ballard MRN: [id number] Date of Birth: [birth year] Referring Provider:   Flowsheet Row Pulmonary Rehab from 02/17/2022 in [facility] Cardiac and Pulmonary Rehab  Referring Provider Despina Arias MD       Encounter Date: 03/22/2022  Check In:  Session Check In - 03/22/22 0756       Check-In   Supervising physician immediately available to respond to emergencies See telemetry face sheet for immediately available ER MD    Location [facility]-Cardiac & Pulmonary Rehab    Staff Present Earlean Shawl, BS, ACSM CEP, Exercise Physiologist;Jefry Lesinski, RN,BC,MSN;Jessica [place], MA, RCEP, CCRP, CCET    Virtual Visit No    Medication changes reported     No    Fall or balance concerns reported    No    Tobacco Cessation No Change    Warm-up and Cool-down Performed on first and last piece of equipment    Resistance Training Performed Yes    VAD Patient? No    PAD/SET Patient? No      Pain Assessment   Currently in Pain? No/denies    Multiple Pain Sites No                Social History   Tobacco Use  Smoking Status Never  Smokeless Tobacco Never    Goals Met:  Independence with exercise equipment Exercise tolerated well No report of concerns or symptoms today  Goals Unmet:  Not Applicable  Comments: Pt able to follow exercise prescription today without complaint.  Will continue to monitor for progression.    Dr. Emily Filbert is Medical Director for [place].  Dr. Ottie Glazier is Medical Director for [facility] Pulmonary Rehabilitation.

## 2022-03-24 ENCOUNTER — Encounter: Payer: Medicare Other | Admitting: *Deleted

## 2022-03-24 DIAGNOSIS — J849 Interstitial pulmonary disease, unspecified: Secondary | ICD-10-CM

## 2022-03-24 NOTE — Progress Notes (Signed)
Daily Session Note  Patient Details  Name: Nicole Ballard MRN: [id number] Date of Birth: 1956-01-11 Referring Provider:   Flowsheet Row Pulmonary Rehab from 02/17/2022 in [facility] Cardiac and Pulmonary Rehab  Referring Provider Despina Arias MD       Encounter Date: 03/24/2022  Check In:  Session Check In - 03/24/22 0920       Check-In   Supervising physician immediately available to respond to emergencies See telemetry face sheet for immediately available ER MD    Location [facility]-Cardiac & Pulmonary Rehab    Staff Present Hope Budds, RDN, LDN;Kristen Coble, RN,BC,MSN;Carlye Panameno, RN, BSN, CCRP;Jessica [place], MA, RCEP, CCRP, CCET    Virtual Visit No    Medication changes reported     No    Fall or balance concerns reported    No    Warm-up and Cool-down Performed on first and last piece of equipment    Resistance Training Performed Yes    VAD Patient? No    PAD/SET Patient? No      Pain Assessment   Currently in Pain? No/denies                Social History   Tobacco Use  Smoking Status Never  Smokeless Tobacco Never    Goals Met:  Independence with exercise equipment Exercise tolerated well No report of concerns or symptoms today  Goals Unmet:  Not Applicable  Comments: Pt able to follow exercise prescription today without complaint.  Will continue to monitor for progression.    Dr. Emily Filbert is Medical Director for [place].  Dr. Ottie Glazier is Medical Director for [facility] Pulmonary Rehabilitation.

## 2022-03-25 NOTE — Unmapped (Signed)
[facility] Refill Coordination Note    Specialty Medication(s) to be Shipped:   Transplant:  mycophenolic acid 360mg     Other medication(s) to be shipped: No additional medications requested for fill at this time     Caitlin Smith, DOB: 12/06/1955  Phone: [phone] (home)       All above HIPAA information was verified with patient.     Was a Nurse, learning disability used for this call? No    Completed refill call assessment today to schedule patient's medication shipment from the [facility] [phone]).  All relevant notes have been reviewed.     Specialty medication(s) and dose(s) confirmed: Regimen is correct and unchanged.   Changes to medications: Caitlin Smith reports no changes at this time.  Changes to insurance: No  New side effects reported not previously addressed with a pharmacist or physician: None reported  Questions for the pharmacist: No    Confirmed patient received a Conservation officer, historic buildings and a Surveyor, mining with first shipment. The patient will receive a drug information handout for each medication shipped and additional FDA Medication Guides as required.       DISEASE/MEDICATION-SPECIFIC INFORMATION        N/A    SPECIALTY MEDICATION ADHERENCE     Medication Adherence    Patient reported X missed doses in the last month: 0  Specialty Medication: Mycophenolate 360mg   Patient is on additional specialty medications: No  Informant: patient              Were doses missed due to medication being on hold? No    Mycophenolate 360 mg: 10 days of medicine on hand     REFERRAL TO PHARMACIST     Referral to the pharmacist: Not needed      [facility]     Shipping address confirmed in Epic.     Delivery Scheduled: Yes, Expected medication delivery date: 03/30/22.     Medication will be delivered via Next Day Courier to the prescription address in Epic WAM.    Caitlin Smith   [facility] Specialty Technician

## 2022-03-29 DIAGNOSIS — J849 Interstitial pulmonary disease, unspecified: Secondary | ICD-10-CM

## 2022-03-29 MED FILL — MYCOPHENOLATE SODIUM 360 MG TABLET,DELAYED RELEASE: ORAL | 90 days supply | Qty: 360 | Fill #2

## 2022-03-29 NOTE — Progress Notes (Signed)
Daily Session Note  Patient Details  Name: Nicole Ballard MRN: [id number] Date of Birth: 04-07-1956 Referring Provider:   Flowsheet Row Pulmonary Rehab from 02/17/2022 in [facility] Cardiac and Pulmonary Rehab  Referring Provider Despina Arias MD       Encounter Date: 03/29/2022  Check In:  Session Check In - 03/29/22 0743       Check-In   Supervising physician immediately available to respond to emergencies See telemetry face sheet for immediately available ER MD    Location [facility]-Cardiac & Pulmonary Rehab    Staff Present Will Bonnet, RN,BC,MSN;Jessica [place], [place], RCEP, CCRP, [employer], BS, ACSM CEP, Exercise Physiologist    Virtual Visit No    Medication changes reported     No    Fall or balance concerns reported    No    Tobacco Cessation No Change    Warm-up and Cool-down Performed on first and last piece of equipment    Resistance Training Performed Yes    VAD Patient? No    PAD/SET Patient? No      Pain Assessment   Currently in Pain? No/denies    Multiple Pain Sites No                Social History   Tobacco Use  Smoking Status Never  Smokeless Tobacco Never    Goals Met:  Independence with exercise equipment Exercise tolerated well Personal goals reviewed No report of concerns or symptoms today Strength training completed today  Goals Unmet:  Not Applicable  Comments: Pt able to follow exercise prescription today without complaint.  Will continue to monitor for progression.    Dr. Emily Filbert is Medical Director for [place].  Dr. Ottie Glazier is Medical Director for [facility] Pulmonary Rehabilitation.

## 2022-03-31 ENCOUNTER — Encounter: Payer: Medicare Other | Admitting: *Deleted

## 2022-03-31 DIAGNOSIS — J849 Interstitial pulmonary disease, unspecified: Secondary | ICD-10-CM | POA: Diagnosis not present

## 2022-03-31 NOTE — Progress Notes (Signed)
Daily Session Note  Patient Details  Name: Nicole Ballard MRN: [id number] Date of Birth: 01/20/1956 Referring Provider:   Flowsheet Row Pulmonary Rehab from 02/17/2022 in [facility] Cardiac and Pulmonary Rehab  Referring Provider Threadgill, Ryan MD       Encounter Date: 03/31/2022  Check In:  Session Check In - 03/31/22 0804       Check-In   Supervising physician immediately available to respond to emergencies See telemetry face sheet for immediately available ER MD    Location [facility]-Cardiac & Pulmonary Rehab    Staff Present Jessica [place], MA, RCEP, CCRP, CCET;Kara Langdon, MS, ASCM CEP, Exercise Physiologist; , RN, BSN, CCRP    Virtual Visit No    Medication changes reported     No    Fall or balance concerns reported    No    Warm-up and Cool-down Performed on first and last piece of equipment    Resistance Training Performed Yes    VAD Patient? No    PAD/SET Patient? No      Pain Assessment   Currently in Pain? No/denies                Social History   Tobacco Use  Smoking Status Never  Smokeless Tobacco Never    Goals Met:  Proper associated with RPD/PD & O2 Sat Independence with exercise equipment Exercise tolerated well No report of concerns or symptoms today  Goals Unmet:  Not Applicable  Comments: Pt able to follow exercise prescription today without complaint.  Will continue to monitor for progression.    Dr. Mark Miller is Medical Director for [facility].  Dr. Fuad Aleskerov is Medical Director for [facility] Pulmonary Rehabilitation. 

## 2022-04-05 ENCOUNTER — Other Ambulatory Visit: Payer: Self-pay

## 2022-04-05 ENCOUNTER — Emergency Department
Admission: EM | Admit: 2022-04-05 | Discharge: 2022-04-05 | Disposition: A | Discharge status: Home / Self Care | Payer: Medicare Other | Attending: Emergency Medicine | Admitting: Emergency Medicine

## 2022-04-05 ENCOUNTER — Encounter: Payer: Self-pay | Admitting: Emergency Medicine

## 2022-04-05 ENCOUNTER — Emergency Department: Payer: Medicare Other

## 2022-04-05 DIAGNOSIS — I129 Hypertensive chronic kidney disease with stage 1 through stage 4 chronic kidney disease, or unspecified chronic kidney disease: Secondary | ICD-10-CM | POA: Diagnosis not present

## 2022-04-05 DIAGNOSIS — R002 Palpitations: Secondary | ICD-10-CM | POA: Diagnosis not present

## 2022-04-05 DIAGNOSIS — N189 Chronic kidney disease, unspecified: Secondary | ICD-10-CM | POA: Diagnosis not present

## 2022-04-05 DIAGNOSIS — E1122 Type 2 diabetes mellitus with diabetic chronic kidney disease: Secondary | ICD-10-CM | POA: Diagnosis not present

## 2022-04-05 DIAGNOSIS — R0789 Other chest pain: Secondary | ICD-10-CM | POA: Diagnosis not present

## 2022-04-05 DIAGNOSIS — R079 Chest pain, unspecified: Secondary | ICD-10-CM | POA: Diagnosis present

## 2022-04-05 LAB — TROPONIN I (HIGH SENSITIVITY)
Troponin I (High Sensitivity): 4 ng/L (ref <18)
Troponin I (High Sensitivity): 4 ng/L (ref <18)

## 2022-04-05 LAB — CBC
HCT: 45.4 % (ref 36.0–46.0)
Hemoglobin: 14.1 g/dL (ref 12.0–15.0)
MCH: 27.2 pg (ref 26.0–34.0)
MCHC: 31.1 g/dL (ref 30.0–36.0)
MCV: 87.5 fL (ref 80.0–100.0)
Platelets: 408 10*3/uL — ABNORMAL HIGH (ref 150–400)
RBC: 5.19 MIL/uL — ABNORMAL HIGH (ref 3.87–5.11)
RDW: 13.9 % (ref 11.5–15.5)
WBC: 8.2 10*3/uL (ref 4.0–10.5)
nRBC: 0 % (ref 0.0–0.2)

## 2022-04-05 LAB — BASIC METABOLIC PANEL
Anion gap: 8 (ref 5–15)
BUN: 15 mg/dL (ref 8–23)
CO2: 31 mmol/L (ref 22–32)
Calcium: 9.5 mg/dL (ref 8.9–10.3)
Chloride: 100 mmol/L (ref 98–111)
Creatinine, Ser: 1.01 mg/dL — ABNORMAL HIGH (ref 0.44–1.00)
GFR, Estimated: >60 mL/min (ref >60)
Glucose, Bld: 74 mg/dL (ref 70–99)
Potassium: 4.7 mmol/L (ref 3.5–5.1)
Sodium: 139 mmol/L (ref 135–145)

## 2022-04-05 MED ORDER — OMEPRAZOLE 40 MG CAPSULE,DELAYED RELEASE
ORAL_CAPSULE | Freq: Every day | ORAL | 3 refills | 90 days | Status: CP
Start: 2022-04-05 — End: ?

## 2022-04-05 NOTE — ED Notes (Signed)
Pt on cardiac monitoring at this time

## 2022-04-05 NOTE — ED Provider Triage Note (Signed)
  Emergency Medicine Provider Triage Evaluation Note  Nicole Ballard , a 66 y.o.female,  was evaluated in triage.  Pt complains of chest pain and palpitations x2 days.  Described as a constant sensation, particular on the left side.  No other symptoms at this time.   Review of Systems  Positive: Chest pain, palpitations Negative: Denies fever, abdominal pain, vomiting  Physical Exam   Vitals:   04/05/22 1541  BP: (!) 140/71  Pulse: 65  Resp: 16  Temp: 98.3 F (36.8 C)  SpO2: 99%   Gen:   Awake, no distress   Resp:  Normal effort  MSK:   Moves extremities without difficulty  Other:    Medical Decision Making  Given the patient's initial medical screening exam, the following diagnostic evaluation has been ordered. The patient will be placed in the appropriate treatment space, once one is available, to complete the evaluation and treatment. I have discussed the plan of care with the patient and I have advised the patient that an ED physician or mid-level practitioner will reevaluate their condition after the test results have been received, as the results may give them additional insight into the type of treatment they may need.    Diagnostics: Labs, CXR, EKG  Treatments: none immediately   Teodoro Spray, [place] 04/05/22 1740

## 2022-04-05 NOTE — ED Triage Notes (Signed)
Having chest pain and palpitations for 2 days.

## 2022-04-05 NOTE — ED Provider Notes (Signed)
[name] Provider Note    Event Date/Time   First MD Initiated Contact with Patient 04/05/22 1808     (approximate)   History   Chief Complaint Chest Pain   HPI  Nicole Ballard is a 66 y.o. female with past medical history of hypertension, hyperlipidemia, diabetes, lupus, CKD, chronic pain syndrome, and bipolar disorder who presents to the ED complaining of chest pain.  Patient reports that she has been dealing with intermittent pain in her chest along with palpitations for the past 24 hours.  She states that the pain seems to come on whenever she is having palpitations, which feels like her heart racing.  She states the palpitations last for about 10 minutes at a time before resolving on their own.  She has not had any associated fevers, cough, shortness of breath, pain or swelling in her legs.  She denies any significant cardiac history.  She does not have any palpitations or chest pain currently.     Physical Exam   Triage Vital Signs: ED Triage Vitals  Enc Vitals Group     BP 04/05/22 1541 (!) 140/71     Pulse Rate 04/05/22 1541 65     Resp 04/05/22 1541 16     Temp 04/05/22 1541 98.3 F (36.8 C)     Temp Source 04/05/22 1541 Oral     SpO2 04/05/22 1541 99 %     Weight 04/05/22 1550 246 lb (111.6 kg)     Height 04/05/22 1550 '5\' 7"'$  (1.702 m)     Head Circumference --      Peak Flow --      Pain Score 04/05/22 1541 4     Pain Loc --      Pain Edu? --      Excl. in [place]? --     Most recent vital signs: Vitals:   04/05/22 1541 04/05/22 1914  BP: (!) 140/71 (!) 142/80  Pulse: 65 62  Resp: 16 17  Temp: 98.3 F (36.8 C)   SpO2: 99% 99%    Constitutional: Alert and oriented. Eyes: Conjunctivae are normal. Head: Atraumatic. Nose: No congestion/rhinnorhea. Mouth/Throat: Mucous membranes are moist.  Cardiovascular: Normal rate, regular rhythm. Grossly normal heart sounds.  2+ radial pulses bilaterally. Respiratory: Normal respiratory  effort.  No retractions. Lungs CTAB.  No chest wall tenderness to palpation noted. Gastrointestinal: Soft and nontender. No distention. Musculoskeletal: No lower extremity tenderness nor edema.  Neurologic:  Normal speech and language. No gross focal neurologic deficits are appreciated.    ED Results / Procedures / Treatments   Labs (all labs ordered are listed, but only abnormal results are displayed) Labs Reviewed  BASIC METABOLIC PANEL - Abnormal; Notable for the following components:      Result Value   Creatinine, Ser 1.01 (*)    All other components within normal limits  CBC - Abnormal; Notable for the following components:   RBC 5.19 (*)    Platelets 408 (*)    All other components within normal limits  TROPONIN I (HIGH SENSITIVITY)  TROPONIN I (HIGH SENSITIVITY)     EKG  ED ECG REPORT I, Blake Divine, the attending physician, personally viewed and interpreted this ECG.   Date: 04/05/2022  EKG Time: 15:47  Rate: 67  Rhythm: normal sinus rhythm  Axis: Normal  Intervals:none  ST&T Change: None  RADIOLOGY Chest x-ray reviewed and interpreted by me with no infiltrate, edema, or effusion.  PROCEDURES:  Critical Care performed: No  Procedures   MEDICATIONS ORDERED IN ED: Medications - No data to display   IMPRESSION / MDM / [place] / ED COURSE  I reviewed the triage vital signs and the nursing notes.                              66 y.o. female with past medical history of hypertension, hyperlipidemia, diabetes, lupus, CKD, chronic pain syndrome, and bipolar disorder who presents to the ED complaining of intermittent palpitations and pain in her chest since yesterday, not present currently.  Patient's presentation is most consistent with acute presentation with potential threat to life or bodily function.  Differential diagnosis includes, but is not limited to, ACS, PE, pneumonia, pneumothorax, arrhythmia, electrolyte abnormality.  Patient  well-appearing and in no acute distress, vital signs unremarkable, EKG shows no evidence of arrhythmia or ischemia.  Patient denies any chest pain or palpitations currently, we will observe on cardiac monitor for recurrent symptoms.  Initial work-up is reassuring, troponin within normal limits and chest x-ray is unremarkable.  Given no current symptoms, doubt PE or dissection.  Labs show no significant anemia, leukocytosis, electrolyte abnormality, or AKI.  We will repeat second set troponin while observing on cardiac monitor.  No events noted on cardiac monitor, repeat troponin within normal limits.  Patient is appropriate for discharge home with cardiology follow-up for possible event monitor.  She was counseled to return to the ED for new or worsening symptoms, patient agrees with plan.      FINAL CLINICAL IMPRESSION(S) / ED DIAGNOSES   Final diagnoses:  Atypical chest pain  Palpitations     Rx / DC Orders   ED Discharge Orders          Ordered    Ambulatory referral to Cardiology        04/05/22 2044             Note:  This document was prepared using Dragon voice recognition software and may include unintentional dictation errors.   Blake Divine, MD 04/05/22 2045

## 2022-04-08 ENCOUNTER — Ambulatory Visit: Admit: 2022-04-08 | Discharge: 2022-04-09 | Payer: MEDICARE

## 2022-04-08 NOTE — Unmapped (Signed)
Performed retinoscopy/refinement to achieve today's MRx. Noted change was mostly in cyl/axis. Patient happy with results. MRx printed out and given to patient.

## 2022-04-12 ENCOUNTER — Encounter: Payer: Medicare Other | Attending: Critical Care Medicine | Admitting: *Deleted

## 2022-04-12 DIAGNOSIS — J849 Interstitial pulmonary disease, unspecified: Secondary | ICD-10-CM | POA: Insufficient documentation

## 2022-04-12 NOTE — Progress Notes (Signed)
Daily Session Note  Patient Details  Name: Nicole Ballard MRN: [id number] Date of Birth: 09/25/1955 Referring Provider:   Flowsheet Row Pulmonary Rehab from 02/17/2022 in [facility] Cardiac and Pulmonary Rehab  Referring Provider Despina Arias MD       Encounter Date: 04/12/2022  Check In:  Session Check In - 04/12/22 0757       Check-In   Supervising physician immediately available to respond to emergencies See telemetry face sheet for immediately available ER MD    Location [facility]-Cardiac & Pulmonary Rehab    Staff Present Nyoka Cowden, RN, BSN, Fenton Foy, BS, Exercise Physiologist;Jessica Luan [place], MA, RCEP, CCRP, CCET    Virtual Visit No    Medication changes reported     No    Tobacco Cessation No Change    Warm-up and Cool-down Performed on first and last piece of equipment    Resistance Training Performed Yes    VAD Patient? No    PAD/SET Patient? No      Pain Assessment   Currently in Pain? No/denies                Social History   Tobacco Use  Smoking Status Never  Smokeless Tobacco Never    Goals Met:  Independence with exercise equipment Exercise tolerated well No report of concerns or symptoms today  Goals Unmet:  Not Applicable  Comments: Pt able to follow exercise prescription today without complaint.  Will continue to monitor for progression.    Dr. Emily Filbert is Medical Director for [place].  Dr. Ottie Glazier is Medical Director for [facility] Pulmonary Rehabilitation.

## 2022-04-13 ENCOUNTER — Other Ambulatory Visit: Payer: Self-pay

## 2022-04-13 ENCOUNTER — Emergency Department: Payer: Medicare Other

## 2022-04-13 ENCOUNTER — Encounter: Payer: Self-pay | Admitting: Emergency Medicine

## 2022-04-13 ENCOUNTER — Encounter: Payer: Self-pay | Admitting: *Deleted

## 2022-04-13 DIAGNOSIS — I493 Ventricular premature depolarization: Secondary | ICD-10-CM | POA: Insufficient documentation

## 2022-04-13 DIAGNOSIS — Z7984 Long term (current) use of oral hypoglycemic drugs: Secondary | ICD-10-CM | POA: Insufficient documentation

## 2022-04-13 DIAGNOSIS — E1122 Type 2 diabetes mellitus with diabetic chronic kidney disease: Secondary | ICD-10-CM | POA: Insufficient documentation

## 2022-04-13 DIAGNOSIS — R002 Palpitations: Secondary | ICD-10-CM | POA: Diagnosis present

## 2022-04-13 DIAGNOSIS — Z20822 Contact with and (suspected) exposure to covid-19: Secondary | ICD-10-CM | POA: Insufficient documentation

## 2022-04-13 DIAGNOSIS — Z7982 Long term (current) use of aspirin: Secondary | ICD-10-CM | POA: Insufficient documentation

## 2022-04-13 DIAGNOSIS — Z79899 Other long term (current) drug therapy: Secondary | ICD-10-CM | POA: Insufficient documentation

## 2022-04-13 DIAGNOSIS — I129 Hypertensive chronic kidney disease with stage 1 through stage 4 chronic kidney disease, or unspecified chronic kidney disease: Secondary | ICD-10-CM | POA: Diagnosis not present

## 2022-04-13 DIAGNOSIS — J849 Interstitial pulmonary disease, unspecified: Secondary | ICD-10-CM

## 2022-04-13 DIAGNOSIS — N189 Chronic kidney disease, unspecified: Secondary | ICD-10-CM | POA: Diagnosis not present

## 2022-04-13 LAB — BASIC METABOLIC PANEL
Anion gap: 8 (ref 5–15)
BUN: 13 mg/dL (ref 8–23)
CO2: 29 mmol/L (ref 22–32)
Calcium: 9.2 mg/dL (ref 8.9–10.3)
Chloride: 104 mmol/L (ref 98–111)
Creatinine, Ser: 1.08 mg/dL — ABNORMAL HIGH (ref 0.44–1.00)
GFR, Estimated: 57 mL/min — ABNORMAL LOW (ref >60)
Glucose, Bld: 118 mg/dL — ABNORMAL HIGH (ref 70–99)
Potassium: 3.8 mmol/L (ref 3.5–5.1)
Sodium: 141 mmol/L (ref 135–145)

## 2022-04-13 LAB — CBC
HCT: 44 % (ref 36.0–46.0)
Hemoglobin: 13.6 g/dL (ref 12.0–15.0)
MCH: 27.2 pg (ref 26.0–34.0)
MCHC: 30.9 g/dL (ref 30.0–36.0)
MCV: 88 fL (ref 80.0–100.0)
Platelets: 363 10*3/uL (ref 150–400)
RBC: 5 MIL/uL (ref 3.87–5.11)
RDW: 14.1 % (ref 11.5–15.5)
WBC: 10.4 10*3/uL (ref 4.0–10.5)
nRBC: 0 % (ref 0.0–0.2)

## 2022-04-13 LAB — TROPONIN I (HIGH SENSITIVITY): Troponin I (High Sensitivity): 5 ng/L (ref <18)

## 2022-04-13 NOTE — ED Triage Notes (Signed)
Patient ambulatory to triage with steady gait, without difficulty or distress noted; pt reports palpitations and mid CP accomp by [facility], seen last wk for same with negative findings

## 2022-04-13 NOTE — Progress Notes (Signed)
Pulmonary Individual Treatment Plan  Patient Details  Name: Nicole Ballard MRN: [id number] Date of Birth: 1956/02/02 Referring Provider:   Flowsheet Row Pulmonary Rehab from 02/17/2022 in [facility] Cardiac and Pulmonary Rehab  Referring Provider Despina Arias MD       Initial Encounter Date:  Flowsheet Row Pulmonary Rehab from 02/17/2022 in [facility] Cardiac and Pulmonary Rehab  Date 02/17/22       Visit Diagnosis: ILD (interstitial lung disease) ([place])  Patient's Home Medications on Admission:  Current Outpatient Medications:    albuterol (VENTOLIN HFA) 108 (90 Base) MCG/ACT inhaler, SMARTSIG:2 Puff(s) By Mouth Every 6 Hours PRN, Disp: , Rfl:    amLODipine (NORVASC) 2.5 MG tablet, TAKE 3 TABLETS BY MOUTH DAILY, Disp: 270 tablet, Rfl: 3   aspirin EC 81 MG tablet, Take 1 tablet (81 mg total) by mouth daily., Disp: 30 tablet, Rfl: 0   atenolol (TENORMIN) 25 MG tablet, Take 1 tablet (25 mg total) by mouth daily., Disp: 90 tablet, Rfl: 3   atorvastatin (LIPITOR) 40 MG tablet, TAKE 1 TABLET BY MOUTH  DAILY, Disp: 90 tablet, Rfl: 3   Biotin 1 MG CAPS, Take 1 capsule by mouth daily., Disp: 30 capsule, Rfl: 0   buPROPion (WELLBUTRIN XL) 300 MG 24 hr tablet, Take 300 mg by mouth in the morning., Disp: , Rfl:    Cholecalciferol (VITAMIN D) 2000 UNITS CAPS, Take 1 capsule by mouth daily., Disp: , Rfl:    clobetasol ointment (TEMOVATE) 7.86 %, Apply 1 application topically 2 (two) times daily., Disp: , Rfl:    clonazePAM (KLONOPIN) 0.5 MG tablet, Take 0.25 mg by mouth at bedtime., Disp: , Rfl:    clotrimazole-betamethasone (LOTRISONE) cream, Apply 1 application topically 2 (two) times daily., Disp: 45 g, Rfl: 0   diclofenac Sodium (VOLTAREN) 1 % GEL, Apply topically daily as needed., Disp: , Rfl:    famotidine (PEPCID) 20 MG tablet, Take by mouth., Disp: , Rfl:    fluticasone (FLONASE) 50 MCG/ACT nasal spray, Place 2 sprays into both nostrils daily., Disp: 48 g, Rfl: 3   fluticasone (FLOVENT HFA)  220 MCG/ACT inhaler, Inhale into the lungs., Disp: , Rfl:    gabapentin (NEURONTIN) 300 MG capsule, Take 300 mg (1 capsule) in the morning and at 300 mg (1 capsule) at noon and 600 mg (2 capsules) at night., Disp: , Rfl:    hydrALAZINE (APRESOLINE) 10 MG tablet, TAKE 1 TABLET BY MOUTH THREE TIMES DAILY AS NEEDED FOR BLOOD PRESSURE ABOVE 150/90, Disp: 90 tablet, Rfl: 2   hydroxychloroquine (PLAQUENIL) 200 MG tablet, Take by mouth 2 (two) times daily., Disp: , Rfl:    hydrOXYzine (ATARAX) 10 MG tablet, Take 1 tablet (10 mg total) by mouth 3 (three) times daily as needed., Disp: 30 tablet, Rfl: 0   lamoTRIgine (LAMICTAL) 200 MG tablet, Take 400 mg by mouth at bedtime., Disp: , Rfl:    LINZESS 145 MCG CAPS capsule, Take 145 mcg by mouth daily., Disp: , Rfl:    loratadine (CLARITIN) 10 MG tablet, Take 1 tablet (10 mg total) by mouth daily., Disp: 90 tablet, Rfl: 1   Melatonin 10 MG TABS, Take 1 tablet by mouth at bedtime., Disp: , Rfl:    metFORMIN (GLUCOPHAGE-XR) 500 MG 24 hr tablet, Take 1 tablet (500 mg total) by mouth daily with breakfast., Disp: 90 tablet, Rfl: 1   mycophenolate (MYFORTIC) 360 MG TBEC EC tablet, Take 720 mg by mouth 2 (two) times daily., Disp: , Rfl:    olopatadine (PATANOL) 0.1 %  ophthalmic solution, Place 1 drop into both eyes 2 (two) times daily., Disp: 5 mL, Rfl: 1   omeprazole (PRILOSEC) 40 MG capsule, TAKE 1 CAPSULE BY MOUTH  DAILY, Disp: 90 capsule, Rfl: 3   Polyethylene Glycol 3350 (MIRALAX PO), Take by mouth., Disp: , Rfl:    predniSONE (DELTASONE) 10 MG tablet, Take by mouth., Disp: , Rfl:    telmisartan (MICARDIS) 80 MG tablet, TAKE 1 TABLET BY MOUTH DAILY, Disp: 90 tablet, Rfl: 3   traZODone (DESYREL) 100 MG tablet, Take 150 mg by mouth at bedtime., Disp: , Rfl:    triamcinolone ointment (KENALOG) 0.1 %, , Disp: , Rfl:    venlafaxine (EFFEXOR) 100 MG tablet, Take 100 mg by mouth every evening., Disp: , Rfl:   Past Medical History: Past Medical History:  Diagnosis  Date   Anxiety    Bipolar 1 disorder ([place])    Chronic kidney disease    Chronic pain    Diabetes mellitus without complication (HCC)    Fibromyalgia    GERD (gastroesophageal reflux disease)    Hearing loss of right ear    Hemifacial spasm    Hyperlipidemia    Hypertension    Insomnia    Lumbago    Osteoarthritis    Paresthesia    Rhinitis    Systemic lupus erythematosus ([place])    Vitamin D deficiency     Tobacco Use: Social History   Tobacco Use  Smoking Status Never  Smokeless Tobacco Never    Labs: Review Flowsheet  More data exists      Latest Ref Rng & Units 10/09/2019 02/14/2020 06/15/2020 12/14/2020 06/15/2021  Labs for ITP Cardiac and Pulmonary Rehab  Cholestrol <200 mg/dL - - 151  - 154   LDL (calc) mg/dL (calc) - - 74  - 75   HDL-C > OR = 50 mg/dL - - 61  - 62   Trlycerides <150 mg/dL - - 78  - 89   Hemoglobin A1c <5.7 % of total Hgb 5.5  5.6  5.5  5.7  5.5      Pulmonary Assessment Scores:  Pulmonary Assessment Scores     Row Name 02/17/22 1115         ADL UCSD   ADL Phase Entry     SOB Score total 43     Rest 1     Walk 2     Stairs 3     Bath 1     Dress 3     Shop 2       CAT Score   CAT Score 26       mMRC Score   mMRC Score 2              UCSD: Self-administered rating of dyspnea associated with activities of daily living (ADLs) 6-point scale (0 = "not at all" to 5 = "maximal or unable to do because of breathlessness")  Scoring Scores range from 0 to 120.  Minimally important difference is 5 units  CAT: CAT can identify the health impairment of COPD patients and is better correlated with disease progression.  CAT has a scoring range of zero to 40. The CAT score is classified into four groups of low (less than 10), medium (10 - 20), high (21-30) and very high (31-40) based on the impact level of disease on health status. A CAT score over 10 suggests significant symptoms.  A worsening CAT score could be explained by an  exacerbation, poor medication adherence,  poor inhaler technique, or progression of COPD or comorbid conditions.  CAT MCID is 2 points  mMRC: mMRC (Modified Medical Research Council) Dyspnea Scale is used to assess the degree of baseline functional disability in patients of respiratory disease due to dyspnea. No minimal important difference is established. A decrease in score of 1 point or greater is considered a positive change.   Pulmonary Function Assessment:  Pulmonary Function Assessment - 02/17/22 1115       Breath   Shortness of Breath Yes;Panic with Shortness of Breath;Fear of Shortness of Breath;Limiting activity             Exercise Target Goals: Exercise Program Goal: Individual exercise prescription set using results from initial 6 min walk test and THRR while considering  patient's activity barriers and safety.   Exercise Prescription Goal: Initial exercise prescription builds to 30-45 minutes a day of aerobic activity, 2-3 days per week.  Home exercise guidelines will be given to patient during program as part of exercise prescription that the participant will acknowledge.  Education: Aerobic Exercise: - Group verbal and visual presentation on the components of exercise prescription. Introduces F.I.T.T principle from ACSM for exercise prescriptions.  Reviews F.I.T.T. principles of aerobic exercise including progression. Written material given at graduation. Flowsheet Row Pulmonary Rehab from 03/10/2022 in [facility] Cardiac and Pulmonary Rehab  Date 03/10/22  Educator [facility]  Instruction Review Code 1- Verbalizes Understanding       Education: Resistance Exercise: - Group verbal and visual presentation on the components of exercise prescription. Introduces F.I.T.T principle from ACSM for exercise prescriptions  Reviews F.I.T.T. principles of resistance exercise including progression. Written material given at graduation.    Education: Exercise & Equipment Safety: -  Individual verbal instruction and demonstration of equipment use and safety with use of the equipment. Flowsheet Row Pulmonary Rehab from 03/10/2022 in [facility] Cardiac and Pulmonary Rehab  Date 02/17/22  Educator [facility]  Instruction Review Code 1- Verbalizes Understanding       Education: Exercise Physiology & General Exercise Guidelines: - Group verbal and written instruction with models to review the exercise physiology of the cardiovascular system and associated critical values. Provides general exercise guidelines with specific guidelines to those with heart or lung disease.  Flowsheet Row Pulmonary Rehab from 03/10/2022 in [facility] Cardiac and Pulmonary Rehab  Date 03/03/22  Educator NT  Instruction Review Code 1- [employer] Understanding       Education: Flexibility, Balance, Mind/Body Relaxation: - Group verbal and visual presentation with interactive activity on the components of exercise prescription. Introduces F.I.T.T principle from ACSM for exercise prescriptions. Reviews F.I.T.T. principles of flexibility and balance exercise training including progression. Also discusses the mind body connection.  Reviews various relaxation techniques to help reduce and manage stress (i.e. Deep breathing, progressive muscle relaxation, and visualization). Balance handout provided to take home. Written material given at graduation.   Activity Barriers & Risk Stratification:  Activity Barriers & Cardiac Risk Stratification - 02/17/22 1048       Activity Barriers & Cardiac Risk Stratification   Activity Barriers Right Knee Replacement;Muscular Weakness;Deconditioning;Balance Concerns;Shortness of Breath             6 Minute Walk:  6 Minute Walk     Row Name 02/17/22 1045         6 Minute Walk   Phase Initial     Distance 1320 feet     Walk Time 6 minutes     # of Rest Breaks 0  MPH 2.5     METS 2.72     RPE 11     Perceived Dyspnea  1     VO2 Peak 9.52     Symptoms Yes (comment)      Comments SOB, dizzy on turns     Resting HR 68 bpm     Resting BP 136/64     Resting Oxygen Saturation  96 %     Exercise Oxygen Saturation  during 6 min walk 85 %     Max Ex. HR 103 bpm     Max Ex. BP 134/72     2 Minute Post BP 132/62       Interval HR   1 Minute HR 86     2 Minute HR 95     3 Minute HR 103     4 Minute HR 102     5 Minute HR 102     6 Minute HR 103     2 Minute Post HR 73     Interval Heart Rate? Yes       Interval Oxygen   Interval Oxygen? Yes     Baseline Oxygen Saturation % 96 %     1 Minute Oxygen Saturation % 89 %     1 Minute Liters of Oxygen 0 L  Room Air     2 Minute Oxygen Saturation % 87 %     2 Minute Liters of Oxygen 0 L     3 Minute Oxygen Saturation % 86 %     3 Minute Liters of Oxygen 0 L     4 Minute Oxygen Saturation % 85 %     4 Minute Liters of Oxygen 0 L     5 Minute Oxygen Saturation % 87 %     5 Minute Liters of Oxygen 0 L     6 Minute Oxygen Saturation % 86 %     6 Minute Liters of Oxygen 0 L     2 Minute Post Oxygen Saturation % 95 %     2 Minute Post Liters of Oxygen 0 L             Oxygen Initial Assessment:  Oxygen Initial Assessment - 02/17/22 1115       Home Oxygen   Home Oxygen Device None    Sleep Oxygen Prescription None    Home Exercise Oxygen Prescription None    Home Resting Oxygen Prescription None    Compliance with Home Oxygen Use Yes      Initial 6 min Walk   Oxygen Used None      Program Oxygen Prescription   Program Oxygen Prescription None      Intervention   Short Term Goals To learn and demonstrate proper pursed lip breathing techniques or other breathing techniques. ;To learn and demonstrate proper use of respiratory medications;To learn and understand importance of maintaining oxygen saturations>88%;To learn and understand importance of monitoring SPO2 with pulse oximeter and demonstrate accurate use of the pulse oximeter.    Long  Term Goals Exhibits proper breathing techniques, such as  pursed lip breathing or other method taught during program session;Demonstrates proper use of MDI's;Compliance with respiratory medication;Maintenance of O2 saturations>88%;Verbalizes importance of monitoring SPO2 with pulse oximeter and return demonstration             Oxygen Re-Evaluation:  Oxygen Re-Evaluation     Row Name 02/22/22 [phone] 03/15/22 0826 03/29/22 0818  Program Oxygen Prescription   Program Oxygen Prescription None None None       Home Oxygen   Home Oxygen Device None None None     Sleep Oxygen Prescription None None None     Home Exercise Oxygen Prescription None None None     Home Resting Oxygen Prescription None None None     Compliance with Home Oxygen Use Yes Yes Yes       Goals/Expected Outcomes   Short Term Goals To learn and demonstrate proper pursed lip breathing techniques or other breathing techniques. ;To learn and understand importance of maintaining oxygen saturations>88%;To learn and understand importance of monitoring SPO2 with pulse oximeter and demonstrate accurate use of the pulse oximeter. To learn and demonstrate proper pursed lip breathing techniques or other breathing techniques. ;To learn and understand importance of maintaining oxygen saturations>88%;To learn and understand importance of monitoring SPO2 with pulse oximeter and demonstrate accurate use of the pulse oximeter. To learn and demonstrate proper pursed lip breathing techniques or other breathing techniques. ;To learn and understand importance of maintaining oxygen saturations>88%;To learn and understand importance of monitoring SPO2 with pulse oximeter and demonstrate accurate use of the pulse oximeter.     Long  Term Goals Exhibits proper breathing techniques, such as pursed lip breathing or other method taught during program session;Compliance with respiratory medication;Maintenance of O2 saturations>88%;Verbalizes importance of monitoring SPO2 with pulse oximeter and return  demonstration Exhibits proper breathing techniques, such as pursed lip breathing or other method taught during program session;Compliance with respiratory medication;Maintenance of O2 saturations>88%;Verbalizes importance of monitoring SPO2 with pulse oximeter and return demonstration Exhibits proper breathing techniques, such as pursed lip breathing or other method taught during program session;Compliance with respiratory medication;Maintenance of O2 saturations>88%;Verbalizes importance of monitoring SPO2 with pulse oximeter and return demonstration     Comments Reviewed PLB technique with pt.  Talked about how it works and it's importance in maintaining their exercise saturations. Nicole Ballard is doing well in rehab. She is getting better at PLB and using it more often. She is good about keeping an eye on her breathing at home.  She has ordered a pulse oximeter for home use. Patient reports that she is doing better wither her SOB and using PLB at home to help control this. She stated she does not have pulse ox at home but she plans to order one.     Goals/Expected Outcomes Short: Become more profiecient at using PLB.   Long: Become independent at using PLB. Short; Use pulse oximeter at home Long: Conitnue to improve PLB Short; Use pulse oximeter at home Long: Conitnue to improve PLB              Oxygen Discharge (Final Oxygen Re-Evaluation):  Oxygen Re-Evaluation - 03/29/22 0818       Program Oxygen Prescription   Program Oxygen Prescription None      Home Oxygen   Home Oxygen Device None    Sleep Oxygen Prescription None    Home Exercise Oxygen Prescription None    Home Resting Oxygen Prescription None    Compliance with Home Oxygen Use Yes      Goals/Expected Outcomes   Short Term Goals To learn and demonstrate proper pursed lip breathing techniques or other breathing techniques. ;To learn and understand importance of maintaining oxygen saturations>88%;To learn and understand importance of  monitoring SPO2 with pulse oximeter and demonstrate accurate use of the pulse oximeter.    Long  Term Goals Exhibits proper breathing techniques, such as  pursed lip breathing or other method taught during program session;Compliance with respiratory medication;Maintenance of O2 saturations>88%;Verbalizes importance of monitoring SPO2 with pulse oximeter and return demonstration    Comments Patient reports that she is doing better wither her SOB and using PLB at home to help control this. She stated she does not have pulse ox at home but she plans to order one.    Goals/Expected Outcomes Short; Use pulse oximeter at home Long: Conitnue to improve PLB             Initial Exercise Prescription:  Initial Exercise Prescription - 02/17/22 1000       Date of Initial Exercise RX and Referring Provider   Date 02/17/22    Referring Provider Despina Arias MD      Oxygen   Maintain Oxygen Saturation 88% or higher      Treadmill   MPH 2.2    Grade 1    Minutes 15    METs 2.99      Recumbant Bike   Level 3    RPM 50    Watts 25    Minutes 15    METs 3      NuStep   Level 3    SPM 80    Minutes 15    METs 3      Track   Laps 35    Minutes 15    METs 2.9      Prescription Details   Frequency (times per week) 2    Duration Progress to 30 minutes of continuous aerobic without signs/symptoms of physical distress      Intensity   THRR 40-80% of Max Heartrate 103-138    Ratings of Perceived Exertion 11-13    Perceived Dyspnea 0-4      Progression   Progression Continue to progress workloads to maintain intensity without signs/symptoms of physical distress.      Resistance Training   Training Prescription Yes    Weight 3 lb    Reps 10-15             Perform Capillary Blood Glucose checks as needed.  Exercise Prescription Changes:   Exercise Prescription Changes     Row Name 02/17/22 1000 03/01/22 0700 03/15/22 0800 03/28/22 1400 04/11/22 1500     Response to  Exercise   Blood Pressure (Admit) 136/64 132/82 112/60 126/64 116/64   Blood Pressure (Exercise) 134/72 142/72 132/70 122/68 --   Blood Pressure (Exit) 122/62 128/72 126/64 104/70 106/68   Heart Rate (Admit) 68 bpm 83 bpm 71 bpm 86 bpm 75 bpm   Heart Rate (Exercise) 103 bpm 94 bpm 87 bpm 88 bpm 97 bpm   Heart Rate (Exit) 75 bpm 79 bpm 85 bpm 77 bpm 82 bpm   Oxygen Saturation (Admit) 96 % 94 % 95 % 92 % 95 %   Oxygen Saturation (Exercise) 85 % 91 % 92 % 91 % 88 %   Oxygen Saturation (Exit) 95 % 93 % 90 % 96 % 92 %   Rating of Perceived Exertion (Exercise) _0 Perceived Dyspnea (Exercise) _1 Symptoms SOB, dizzy on turns SOB SOB SOB SOB   Comments walk test results 2nd full day of exercise -- -- --   Duration -- Progress to 30 minutes of  aerobic without signs/symptoms of physical distress Continue with 30 min of aerobic exercise without signs/symptoms of physical distress. Continue with 30 min of aerobic exercise  without signs/symptoms of physical distress. Continue with 30 min of aerobic exercise without signs/symptoms of physical distress.   Intensity -- THRR unchanged THRR unchanged THRR unchanged THRR unchanged     Progression   Progression -- Continue to progress workloads to maintain intensity without signs/symptoms of physical distress. Continue to progress workloads to maintain intensity without signs/symptoms of physical distress. Continue to progress workloads to maintain intensity without signs/symptoms of physical distress. Continue to progress workloads to maintain intensity without signs/symptoms of physical distress.   Average METs -- 2.95 3.63 3.65 3.83     Resistance Training   Training Prescription -- Yes Yes Yes Yes   Weight -- 3 lb 3 lb 3 lb 4 lb   Reps -- 10-15 10-15 10-15 10-15     Interval Training   Interval Training -- No No No No     Treadmill   MPH -- 2._0 Grade -- 1 1 3.5 3.5   Minutes -- _1 METs -- 2.99 3.71 4.74 4.75      Recumbant Bike   Level -- _2 3.3   Watts -- 25 -- 17 --   Minutes -- _3 METs -- 2.69 3.1 2.41 2.7     NuStep   Level -- _4 Minutes -- _5 METs -- 3.6 4.1 3.8 4.4     Home Exercise Plan   Plans to continue exercise at -- -- Home (comment)  walking, staff videos Home (comment)  walking, staff videos Home (comment)  walking, staff videos   Frequency -- -- Add 3 additional days to program exercise sessions. Add 3 additional days to program exercise sessions. Add 3 additional days to program exercise sessions.   Initial Home Exercises Provided -- -- 03/15/22 03/15/22 03/15/22     Oxygen   Maintain Oxygen Saturation -- 88% or higher 88% or higher 88% or higher 88% or higher            Exercise Comments:   Exercise Comments     Row Name 02/22/22 8280           Exercise Comments First full day of exercise!  Patient was oriented to gym and equipment including functions, settings, policies, and procedures.  Patient's individual exercise prescription and treatment plan were reviewed.  All starting workloads were established based on the results of the 6 minute walk test done at initial orientation visit.  The plan for exercise progression was also introduced and progression will be customized based on patient's performance and goals.                Exercise Goals and Review:   Exercise Goals     Row Name 02/17/22 1112             Exercise Goals   Increase Physical Activity Yes       Intervention Provide advice, education, support and counseling about physical activity/exercise needs.;Develop an individualized exercise prescription for aerobic and resistive training based on initial evaluation findings, risk stratification, comorbidities and participant's personal goals.       Expected Outcomes Short Term: Attend rehab on a regular basis to increase amount of physical activity.;Long Term: Add in home exercise to make exercise part of  routine and to increase amount of physical activity.;Long Term: Exercising regularly at least 3-5 days a week.       Increase  Strength and Stamina Yes       Intervention Provide advice, education, support and counseling about physical activity/exercise needs.;Develop an individualized exercise prescription for aerobic and resistive training based on initial evaluation findings, risk stratification, comorbidities and participant's personal goals.       Expected Outcomes Short Term: Increase workloads from initial exercise prescription for resistance, speed, and METs.;Short Term: Perform resistance training exercises routinely during rehab and add in resistance training at home;Long Term: Improve cardiorespiratory fitness, muscular endurance and strength as measured by increased METs and functional capacity (6MWT)       Able to understand and use rate of perceived exertion (RPE) scale Yes       Intervention Provide education and explanation on how to use RPE scale       Expected Outcomes Short Term: Able to use RPE daily in rehab to express subjective intensity level;Long Term:  Able to use RPE to guide intensity level when exercising independently       Able to understand and use Dyspnea scale Yes       Intervention Provide education and explanation on how to use Dyspnea scale       Expected Outcomes Short Term: Able to use Dyspnea scale daily in rehab to express subjective sense of shortness of breath during exertion;Long Term: Able to use Dyspnea scale to guide intensity level when exercising independently       Knowledge and understanding of Target Heart Rate Range (THRR) Yes       Intervention Provide education and explanation of THRR including how the numbers were predicted and where they are located for reference       Expected Outcomes Short Term: Able to state/look up THRR;Short Term: Able to use daily as guideline for intensity in rehab;Long Term: Able to use THRR to govern intensity when  exercising independently       Able to check pulse independently Yes       Intervention Provide education and demonstration on how to check pulse in carotid and radial arteries.;Review the importance of being able to check your own pulse for safety during independent exercise       Expected Outcomes Short Term: Able to explain why pulse checking is important during independent exercise;Long Term: Able to check pulse independently and accurately       Understanding of Exercise Prescription Yes       Intervention Provide education, explanation, and written materials on patient's individual exercise prescription       Expected Outcomes Short Term: Able to explain program exercise prescription;Long Term: Able to explain home exercise prescription to exercise independently                Exercise Goals Re-Evaluation :  Exercise Goals Re-Evaluation     Row Name 02/22/22 0752 03/01/22 0759 03/15/22 0805 03/28/22 1426 03/29/22 0802     Exercise Goal Re-Evaluation   Exercise Goals Review Increase Physical Activity;Able to understand and use rate of perceived exertion (RPE) scale;Knowledge and understanding of Target Heart Rate Range (THRR);Understanding of Exercise Prescription;Increase Strength and Stamina;Able to check pulse independently;Able to understand and use Dyspnea scale Increase Physical Activity;Increase Strength and Stamina;Understanding of Exercise Prescription Increase Physical Activity;Increase Strength and Stamina;Understanding of Exercise Prescription;Able to understand and use rate of perceived exertion (RPE) scale;Able to understand and use Dyspnea scale;Knowledge and understanding of Target Heart Rate Range (THRR);Able to check pulse independently Increase Physical Activity;Increase Strength and Stamina;Understanding of Exercise Prescription Increase Physical Activity;Increase Strength and Stamina;Understanding of Exercise  Prescription   Comments Reviewed RPE and dyspnea scales, THR  and program prescription with pt today.  Pt voiced understanding and was given a copy of goals to take home. Eleyna is doing well to start rehab. She has tolerated level 3 on the T4. She has also done well on the treadmill at 2.2 mph. We will continue to monitor her progress in the program. Nicole Ballard is doing well in rehab.  She is already walking on the days she is not in class at least three times a week.  Reviewed home exercise with pt today.  Pt plans to walk and use staff videos at home for exercise.  Reviewed THR, pulse, RPE, sign and symptoms, pulse oximetery and when to call 911 or MD.  Also discussed weather considerations and indoor options.  Pt voiced understanding. Nicole Ballard is doing well in rehab.  She is up to level 4 on the NuStep and 17 watts on the bike. She has also moved up her treadmill to 3.0 mph!  We will continue to monitor her progress. Nicole Ballard reports that she has made improvements in her strength and stamina. Her SOB has also improved with exercise and she is using PLB to control SOB during exersion. She does not have a pulse ox to monitor SaO2 levels but plans to get one.   Expected Outcomes Short: Use RPE daily to regulate intensity. Long: Follow program prescription in THR. Short: continue to increase treadmill speed. Long: Continue to increase MET levels. Short: Continue to walk and try staff videos Long: Conitnue to exercise independently Short: Try 4 lb weights Long: Continue to improve stamina Short: buy a pulse oximeter. Long: become independent with exercise program.    [place] Name 04/11/22 1512             Exercise Goal Re-Evaluation   Exercise Goals Review Increase Physical Activity;Increase Strength and Stamina;Understanding of Exercise Prescription       Comments Nicole Ballard continues to do well in rehab. She has reached working over 4 METS on the T4 Nustep. She increased to 4 lbs for handweights. She is not qutie hitting her THR. Will continue to monitor.       Expected Outcomes  Short: Incread load and watts on RB Long: Continue to increase overall MET level                Discharge Exercise Prescription (Final Exercise Prescription Changes):  Exercise Prescription Changes - 04/11/22 1500       Response to Exercise   Blood Pressure (Admit) 116/64    Blood Pressure (Exit) 106/68    Heart Rate (Admit) 75 bpm    Heart Rate (Exercise) 97 bpm    Heart Rate (Exit) 82 bpm    Oxygen Saturation (Admit) 95 %    Oxygen Saturation (Exercise) 88 %    Oxygen Saturation (Exit) 92 %    Rating of Perceived Exertion (Exercise) 14    Perceived Dyspnea (Exercise) 1    Symptoms SOB    Duration Continue with 30 min of aerobic exercise without signs/symptoms of physical distress.    Intensity THRR unchanged      Progression   Progression Continue to progress workloads to maintain intensity without signs/symptoms of physical distress.    Average METs 3.83      Resistance Training   Training Prescription Yes    Weight 4 lb    Reps 10-15      Interval Training   Interval Training No  Treadmill   MPH 3    Grade 3.5    Minutes 15    METs 4.75      Recumbant Bike   Level 3.3    Minutes 15    METs 2.7      NuStep   Level 4    Minutes 15    METs 4.4      Home Exercise Plan   Plans to continue exercise at Home (comment)   walking, staff videos   Frequency Add 3 additional days to program exercise sessions.    Initial Home Exercises Provided 03/15/22      Oxygen   Maintain Oxygen Saturation 88% or higher             Nutrition:  Target Goals: Understanding of nutrition guidelines, daily intake of sodium <1558m, cholesterol <2050m calories 30% from fat and 7% or less from saturated fats, daily to have 5 or more servings of fruits and vegetables.  Education: All About Nutrition: -Group instruction provided by verbal, written material, interactive activities, discussions, models, and posters to present general guidelines for heart healthy nutrition  including fat, fiber, MyPlate, the role of sodium in heart healthy nutrition, utilization of the nutrition label, and utilization of this knowledge for meal planning. Follow up email sent as well. Written material given at graduation.   Biometrics:  Pre Biometrics - 02/17/22 1112       Pre Biometrics   Height _0  (1.727 m)    Weight 247 lb 12.8 oz (112.4 kg)    BMI (Calculated) 37.69    Single Leg Stand 4.4 seconds              Nutrition Therapy Plan and Nutrition Goals:  Nutrition Therapy & Goals - 03/07/22 0816       Nutrition Therapy   Diet Heart healthy, low Na, pulmonary MNT    Drug/Food Interactions Statins/Certain Fruits    Protein (specify units) 100g   x1.2/kg adjusted body weight   Fiber 25 grams    Whole Grain Foods 3 servings    Saturated Fats 16 max. grams    Fruits and Vegetables 8 servings/day    Sodium 2 grams      Personal Nutrition Goals   Nutrition Goal ST: try whole wheat bread, try out low prep meals follow MyPlate LT: Follow MyPlate guidelines, 10235Trotein/day, 25g fiber/day    Comments [age].o. F admitted to pulmonary rehab for ILD. PMHx includes connective tissue disease, HTN, GERD, systemic lupus erythematosus, fibromyalgia, burning mouth syndrome, anxiety, PTSD. Relevant medications includes lipitor, biotin, wellbutrin, lamotrigine, vit D, klonopin, hydroxychloroquine, linzess, melatonin, metformin, omeprazole, miralax, prednisone, trazadone, venlafaxine. PYP Score: 62. Vegetables & Fruits 6/12. Breads, Grains & Cereals 7/12. Red & Processed Meat 12/12. Poultry 0/2. Fish & Shellfish 0/4. Beans, Nuts & Seeds 1/4. Milk & Dairy Foods 5/6. Toppings, Oils, Seasonings & Salt 17/20. Sweets, Snacks & Restaurant Food 6/14. Beverages 8/10.   She does not cook much. B: strawberries, breakfast bar L: peanut butter and banana sandwich S: fruit (grapes) D: chicken salad sandwich, chili, baked chicken. She uses honey wheat bread, she will sometimes cook food and then  freeze them such as chilli and beans, she likes sweet potato fries. Drinks: water. Discussed heart healthy eating and pulmonary MNT.      Intervention Plan   Intervention Prescribe, educate and counsel regarding individualized specific dietary modifications aiming towards targeted core components such as weight, hypertension, lipid management, diabetes, heart failure and other comorbidities.  Expected Outcomes Short Term Goal: Understand basic principles of dietary content, such as calories, fat, sodium, cholesterol and nutrients.;Short Term Goal: A plan has been developed with personal nutrition goals set during dietitian appointment.;Long Term Goal: Adherence to prescribed nutrition plan.             Nutrition Assessments:  MEDIFICTS Score Key: ?70 Need to make dietary changes  40-70 Heart Healthy Diet ? 40 Therapeutic Level Cholesterol Diet  Flowsheet Row Pulmonary Rehab from 02/17/2022 in [facility] Cardiac and Pulmonary Rehab  Picture Your Plate Total Score on Admission 62      Picture Your Plate Scores: <62 Unhealthy dietary pattern with much room for improvement. 41-50 Dietary pattern unlikely to meet recommendations for good health and room for improvement. 51-60 More healthful dietary pattern, with some room for improvement.  >60 Healthy dietary pattern, although there may be some specific behaviors that could be improved.   Nutrition Goals Re-Evaluation:  Nutrition Goals Re-Evaluation     [place] Name 03/29/22 0807             Goals   Comment Patient reports that she has been trying to encorporate more whole grains and fruit into her diet. She is working on reading labels to make sure her foods have good amounts of protein and fiber.       Expected Outcome Short: Continue to read food labels. Long: help control risk factors with nutrition changes.                Nutrition Goals Discharge (Final Nutrition Goals Re-Evaluation):  Nutrition Goals Re-Evaluation -  03/29/22 0807       Goals   Comment Patient reports that she has been trying to encorporate more whole grains and fruit into her diet. She is working on reading labels to make sure her foods have good amounts of protein and fiber.    Expected Outcome Short: Continue to read food labels. Long: help control risk factors with nutrition changes.             Psychosocial: Target Goals: Acknowledge presence or absence of significant depression and/or stress, maximize coping skills, provide positive support system. Participant is able to verbalize types and ability to use techniques and skills needed for reducing stress and depression.   Education: Stress, Anxiety, and Depression - Group verbal and visual presentation to define topics covered.  Reviews how body is impacted by stress, anxiety, and depression.  Also discusses healthy ways to reduce stress and to treat/manage anxiety and depression.  Written material given at graduation. Flowsheet Row Pulmonary Rehab from 03/10/2022 in [facility] Cardiac and Pulmonary Rehab  Date 02/24/22  Educator [facility]  Instruction Review Code 1- [employer] Understanding       Education: Sleep Hygiene -Provides group verbal and written instruction about how sleep can affect your health.  Define sleep hygiene, discuss sleep cycles and impact of sleep habits. Review good sleep hygiene tips.    Initial Review & Psychosocial Screening:  Initial Psych Review & Screening - 02/02/22 1421       Initial Review   Current issues with Current Psychotropic Meds;Current Depression;Current Anxiety/Panic;Current Stress Concerns    Source of Stress Concerns Chronic Illness    Comments several health issues can cause her to feel down. Tries to focus on the positive.      Family Dynamics   Good Support System? Yes   son and daughter, friend     Barriers   Psychosocial barriers to participate in program There  are no identifiable barriers or psychosocial needs.;The patient should  benefit from training in stress management and relaxation.      Screening Interventions   Interventions Encouraged to exercise;To provide support and resources with identified psychosocial needs;Provide feedback about the scores to participant    Expected Outcomes Short Term goal: Utilizing psychosocial counselor, staff and physician to assist with identification of specific Stressors or current issues interfering with healing process. Setting desired goal for each stressor or current issue identified.;Long Term Goal: Stressors or current issues are controlled or eliminated.;Short Term goal: Identification and review with participant of any Quality of Life or Depression concerns found by scoring the questionnaire.;Long Term goal: The participant improves quality of Life and PHQ9 Scores as seen by post scores and/or verbalization of changes             Quality of Life Scores:  Scores of 19 and below usually indicate a poorer quality of life in these areas.  A difference of  2-3 points is a clinically meaningful difference.  A difference of 2-3 points in the total score of the Quality of Life Index has been associated with significant improvement in overall quality of life, self-image, physical symptoms, and general health in studies assessing change in quality of life.  PHQ-9: Review Flowsheet  More data exists      04/12/2022 03/15/2022 02/17/2022 12/20/2021 10/01/2021  Depression screen PHQ 2/9  Decreased Interest _0 0 0  Down, Depressed, Hopeless _1 0 0  PHQ - 2 Score _2 0 0  Altered sleeping 0 2 2 0 0  Tired, decreased energy _3 0 0  Change in appetite 3 0 0 0 0  Feeling bad or failure about yourself  _4 0  Trouble concentrating 1 0 2 0 0  Moving slowly or fidgety/restless _5 0 0  Suicidal thoughts 0 0 0 0 0  PHQ-9 Score _6 0  Difficult doing work/chores Very difficult Somewhat difficult Somewhat difficult - -   Interpretation of Total Score  Total Score  Depression Severity:  1-4 = Minimal depression, 5-9 = Mild depression, 10-14 = Moderate depression, 15-19 = Moderately severe depression, 20-27 = Severe depression   Psychosocial Evaluation and Intervention:  Psychosocial Evaluation - 02/02/22 1435       Psychosocial Evaluation & Interventions   Interventions Encouraged to exercise with the program and follow exercise prescription    Comments Nicole Ballard has no barriers to attending the program. She lives alone. She has a son and a daughter that are her support along with her firends. She is on meds for depression and anxiety. She has multiple illnesses and it can bring her down at times. She tries hard to look at the bright side to prevent being pulled down. She wants to see if she can help her lungs heal as much as possible. She is reday to get started.    Expected Outcomes STG Nicole Ballard is able to attend all scheduled sessions. She learns rlaxation and de stress exercises while here. LTG Jailin is able to continue her exercise progression and manage her stress and depression easier.    Continue Psychosocial Services  Follow up required by staff             Psychosocial Re-Evaluation:  Psychosocial Re-Evaluation     [place] Name 03/15/22 0813 03/29/22 [id number]           Psychosocial Re-Evaluation   Current  issues with Current Psychotropic Meds;History of Depression;Current Anxiety/Panic;Current Depression;Current Sleep Concerns Current Psychotropic Meds;History of Depression;Current Anxiety/Panic;Current Depression;Current Sleep Concerns      Comments Nicole Ballard is doing well mentally.  She feels that she is doing a little better with her depression currently as she is is just feeling better overall.  Her PHQ has improved by 2 pts over last couple of weeks. She has been struggling with sleep recently for both going to sleep and waking up.  She lays in bed for a while before going to sleep.  She wakes early and unable to go back to sleep.  She takes  melatonin each nigh in tablet form.  She is going to try to up her dose some and take 30 min before bed.   She is feeling well managed on her meds.  She does get frustrated with feeling like she still gets SOB. Patient reports that she is going well mentally. She is still on treatment but everything has been stable with no new concerns. She has reported that her SOB is improving since starting the program.      Expected Outcomes Short: Try to increase melatonin to help with sleep Long: Continue to focus on positive and exercies for mental boost. Short: continue in current treatment. Long: continue to work on positive mental health  behaviors.      Interventions Encouraged to attend Pulmonary Rehabilitation for the exercise;Stress management education Encouraged to attend Pulmonary Rehabilitation for the exercise;Stress management education      Continue Psychosocial Services  Follow up required by staff Follow up required by staff               Psychosocial Discharge (Final Psychosocial Re-Evaluation):  Psychosocial Re-Evaluation - 03/29/22 0811       Psychosocial Re-Evaluation   Current issues with Current Psychotropic Meds;History of Depression;Current Anxiety/Panic;Current Depression;Current Sleep Concerns    Comments Patient reports that she is going well mentally. She is still on treatment but everything has been stable with no new concerns. She has reported that her SOB is improving since starting the program.    Expected Outcomes Short: continue in current treatment. Long: continue to work on positive mental health  behaviors.    Interventions Encouraged to attend Pulmonary Rehabilitation for the exercise;Stress management education    Continue Psychosocial Services  Follow up required by staff             Education: Education Goals: Education classes will be provided on a weekly basis, covering required topics. Participant will state understanding/return demonstration of topics  presented.  Learning Barriers/Preferences:   General Pulmonary Education Topics:  Infection Prevention: - Provides verbal and written material to individual with discussion of infection control including proper hand washing and proper equipment cleaning during exercise session. Flowsheet Row Pulmonary Rehab from 03/10/2022 in [facility] Cardiac and Pulmonary Rehab  Date 02/17/22  Educator [facility]  Instruction Review Code 1- Verbalizes Understanding       Falls Prevention: - Provides verbal and written material to individual with discussion of falls prevention and safety. Flowsheet Row Pulmonary Rehab from 03/10/2022 in [facility] Cardiac and Pulmonary Rehab  Date 02/02/22  Educator SB  Instruction Review Code 1- Verbalizes Understanding       Chronic Lung Disease Review: - Group verbal instruction with posters, models, PowerPoint presentations and videos,  to review new updates, new respiratory medications, new advancements in procedures and treatments. Providing information on websites and "800" numbers for continued self-education. Includes information about supplement oxygen,  available portable oxygen systems, continuous and intermittent flow rates, oxygen safety, concentrators, and Medicare reimbursement for oxygen. Explanation of Pulmonary Drugs, including class, frequency, complications, importance of spacers, rinsing mouth after steroid MDI's, and proper cleaning methods for nebulizers. Review of basic lung anatomy and physiology related to function, structure, and complications of lung disease. Review of risk factors. Discussion about methods for diagnosing sleep apnea and types of masks and machines for OSA. Includes a review of the use of types of environmental controls: home humidity, furnaces, filters, dust mite/pet prevention, HEPA vacuums. Discussion about weather changes, air quality and the benefits of nasal washing. Instruction on Warning signs, infection symptoms, calling MD promptly,  preventive modes, and value of vaccinations. Review of effective airway clearance, coughing and/or vibration techniques. Emphasizing that all should Create an Action Plan. Written material given at graduation. Flowsheet Row Pulmonary Rehab from 03/10/2022 in [facility] Cardiac and Pulmonary Rehab  Education need identified 02/17/22  Date 02/17/22  Educator [facility]  Instruction Review Code 1- Verbalizes Understanding       AED/CPR: - Group verbal and written instruction with the use of models to demonstrate the basic use of the AED with the basic ABC's of resuscitation.    Anatomy and Cardiac Procedures: - Group verbal and visual presentation and models provide information about basic cardiac anatomy and function. Reviews the testing methods done to diagnose heart disease and the outcomes of the test results. Describes the treatment choices: Medical Management, Angioplasty, or Coronary Bypass Surgery for treating various heart conditions including Myocardial Infarction, Angina, Valve Disease, and Cardiac Arrhythmias.  Written material given at graduation.   Medication Safety: - Group verbal and visual instruction to review commonly prescribed medications for heart and lung disease. Reviews the medication, class of the drug, and side effects. Includes the steps to properly store meds and maintain the prescription regimen.  Written material given at graduation.   Other: -Provides group and verbal instruction on various topics (see comments)   Knowledge Questionnaire Score:  Knowledge Questionnaire Score - 02/17/22 1114       Knowledge Questionnaire Score   Pre Score 12/18              Core Components/Risk Factors/Patient Goals at Admission:  Personal Goals and Risk Factors at Admission - 02/17/22 1114       Core Components/Risk Factors/Patient Goals on Admission    Weight Management Yes;Weight Loss;Obesity    Intervention Weight Management: Develop a combined nutrition and exercise  program designed to reach desired caloric intake, while maintaining appropriate intake of nutrient and fiber, sodium and fats, and appropriate energy expenditure required for the weight goal.;Weight Management/Obesity: Establish reasonable short term and long term weight goals.;Obesity: Provide education and appropriate resources to help participant work on and attain dietary goals.;Weight Management: Provide education and appropriate resources to help participant work on and attain dietary goals.    Admit Weight 247 lb 12.8 oz (112.4 kg)    Goal Weight: Short Term 243 lb (110.2 kg)    Goal Weight: Long Term 200 lb (90.7 kg)    Expected Outcomes Short Term: Continue to assess and modify interventions until short term weight is achieved;Weight Loss: Understanding of general recommendations for a balanced deficit meal plan, which promotes 1-2 lb weight loss per week and includes a negative energy balance of [phone] kcal/d;Understanding recommendations for meals to include 15-35% energy as protein, 25-35% energy from fat, 35-60% energy from carbohydrates, less than 291m of dietary cholesterol, 20-35 gm of total fiber daily;Understanding  of distribution of calorie intake throughout the day with the consumption of 4-5 meals/snacks    Improve shortness of breath with ADL's Yes    Intervention Provide education, individualized exercise plan and daily activity instruction to help decrease symptoms of SOB with activities of daily living.    Expected Outcomes Short Term: Improve cardiorespiratory fitness to achieve a reduction of symptoms when performing ADLs;Long Term: Be able to perform more ADLs without symptoms or delay the onset of symptoms    Increase knowledge of respiratory medications and ability to use respiratory devices properly  Yes    Intervention Provide education and demonstration as needed of appropriate use of medications, inhalers, and oxygen therapy.    Expected Outcomes Short Term: Achieves  understanding of medications use. Understands that oxygen is a medication prescribed by physician. Demonstrates appropriate use of inhaler and oxygen therapy.;Long Term: Maintain appropriate use of medications, inhalers, and oxygen therapy.    Hypertension Yes    Intervention Provide education on lifestyle modifcations including regular physical activity/exercise, weight management, moderate sodium restriction and increased consumption of fresh fruit, vegetables, and low fat dairy, alcohol moderation, and smoking cessation.;Monitor prescription use compliance.    Expected Outcomes Short Term: Continued assessment and intervention until BP is < 140/73m HG in hypertensive participants. < 130/880mHG in hypertensive participants with diabetes, heart failure or chronic kidney disease.;Long Term: Maintenance of blood pressure at goal levels.    Lipids Yes    Intervention Provide education and support for participant on nutrition & aerobic/resistive exercise along with prescribed medications to achieve LDL <7065mHDL >42m34m  Expected Outcomes Short Term: Participant states understanding of desired cholesterol values and is compliant with medications prescribed. Participant is following exercise prescription and nutrition guidelines.;Long Term: Cholesterol controlled with medications as prescribed, with individualized exercise RX and with personalized nutrition plan. Value goals: LDL < 70mg62mL > 40 mg.             Education:Diabetes - Individual verbal and written instruction to review signs/symptoms of diabetes, desired ranges of glucose level fasting, after meals and with exercise. Acknowledge that pre and post exercise glucose checks will be done for 3 sessions at entry of program.   Know Your Numbers and Heart Failure: - Group verbal and visual instruction to discuss disease risk factors for cardiac and pulmonary disease and treatment options.  Reviews associated critical values for  Overweight/Obesity, Hypertension, Cholesterol, and Diabetes.  Discusses basics of heart failure: signs/symptoms and treatments.  Introduces Heart Failure Zone chart for action plan for heart failure.  Written material given at graduation.   Core Components/Risk Factors/Patient Goals Review:   Goals and Risk Factor Review     Row Name 03/15/22 0823 03/29/22 0809           Core Components/Risk Factors/Patient Goals Review   Personal Goals Review Weight Management/Obesity;Increase knowledge of respiratory medications and ability to use respiratory devices properly.;Improve shortness of breath with ADL's;Hypertension;Lipids Weight Management/Obesity;Increase knowledge of respiratory medications and ability to use respiratory devices properly.;Improve shortness of breath with ADL's;Hypertension;Lipids      Review SandrLuluoing well in rehab.  Her weight is trending down.  She is down to 246 lb today.  She is still getting SOB some days.  Today, was the first day that she felt winded before class and during class, so she knows that it is getting better overall.  She is on prednisone and doing well with her inhalers to help her breathing.  Her pressures  continue to do well and she is checking them three times a week at home. Nicole Ballard reports that she has had steady weight loss of about 1 lb a week. She montiors her BP at home and reports that they are in acceptable ranges. She also reports improvements in SOB and reports using PLB to help control this.      Expected Outcomes Short: Continue to work on breathing Long: Conitnue to work on [employer] loss Short: continue to use PLB to help control SOB. Long: continue to work on weight loss goal.               Core Components/Risk Factors/Patient Goals at Discharge (Final Review):   Goals and Risk Factor Review - 03/29/22 0809       Core Components/Risk Factors/Patient Goals Review   Personal Goals Review Weight Management/Obesity;Increase knowledge of  respiratory medications and ability to use respiratory devices properly.;Improve shortness of breath with ADL's;Hypertension;Lipids    Review Nicole Ballard reports that she has had steady weight loss of about 1 lb a week. She montiors her BP at home and reports that they are in acceptable ranges. She also reports improvements in SOB and reports using PLB to help control this.    Expected Outcomes Short: continue to use PLB to help control SOB. Long: continue to work on weight loss goal.             ITP Comments:  ITP Comments     Row Name 02/02/22 1433 02/17/22 1044 02/22/22 0752 03/07/22 0816 03/16/22 0953   ITP Comments Virtual orientation call completed today. shehas an appointment on Date: 02/09/2022  for EP eval and gym Orientation.  Documentation of diagnosis [facility] Date: 12/29/2021 . Completed 6MWT and gym orientation. Initial ITP created and sent for review to Dr. Zetta Bills, Medical Director. First full day of exercise!  Patient was oriented to gym and equipment including functions, settings, policies, and procedures.  Patient's individual exercise prescription and treatment plan were reviewed.  All starting workloads were established based on the results of the 6 minute walk test done at initial orientation visit.  The plan for exercise progression was also introduced and progression will be customized based on patient's performance and goals. Completed initial RD consultation 30 Day review completed. Medical Director ITP review done, changes made as directed, and signed approval by Medical Director.    [place] Name 04/13/22 0754           ITP Comments 30 Day review completed. Medical Director ITP review done, changes made as directed, and signed approval by Medical Director.                Comments:

## 2022-04-14 ENCOUNTER — Ambulatory Visit: Payer: Self-pay

## 2022-04-14 ENCOUNTER — Emergency Department
Admission: EM | Admit: 2022-04-14 | Discharge: 2022-04-14 | Disposition: A | Discharge status: Home / Self Care | Payer: Medicare Other | Attending: Emergency Medicine | Admitting: Emergency Medicine

## 2022-04-14 DIAGNOSIS — I493 Ventricular premature depolarization: Secondary | ICD-10-CM

## 2022-04-14 LAB — SARS CORONAVIRUS 2 BY RT PCR: SARS Coronavirus 2 by RT PCR: NEGATIVE

## 2022-04-14 LAB — MAGNESIUM: Magnesium: 2.1 mg/dL (ref 1.7–2.4)

## 2022-04-14 LAB — TSH: TSH: 1.92 u[IU]/mL (ref 0.350–4.500)

## 2022-04-14 LAB — D-DIMER, QUANTITATIVE: D-Dimer, Quant: 0.48 ug/mL-FEU (ref 0.00–0.50)

## 2022-04-14 LAB — TROPONIN I (HIGH SENSITIVITY): Troponin I (High Sensitivity): 5 ng/L (ref <18)

## 2022-04-14 MED ORDER — SODIUM CHLORIDE 0.9 % IV BOLUS (SEPSIS)
500.0000 mL | Freq: Once | INTRAVENOUS | Status: AC
  Administered 2022-04-14: 500 mL via INTRAVENOUS

## 2022-04-14 NOTE — ED Provider Notes (Signed)
[facility] Provider Note    Event Date/Time   First MD Initiated Contact with Patient 04/14/22 0110     (approximate)   History   Chest Pain   HPI  RYLEE HUESTIS is a 66 y.o. female with history of interstitial lung disease due to lupus on Plaquenil, hypertension, hyperlipidemia, diabetes, chronic kidney disease who presents to the emergency department with palpitations that started Monday, July 31.  No chest pain.  No shortness of breath other than chronic shortness of breath which is unchanged.  Has had nausea and dizziness.  No fever.  Has had nonproductive cough.  No vomiting, diarrhea, bloody stools, melena.  Denies caffeine use.  No illicit drug abuse.  No other stimulant use.  No history of thyroid issues.  Is on the atenolol 25 mg at night.  Does not have a cardiologist.     History provided by patient.    Past Medical History:  Diagnosis Date   Anxiety    Bipolar 1 disorder ([place])    Chronic kidney disease    Chronic pain    Diabetes mellitus without complication (HCC)    Fibromyalgia    GERD (gastroesophageal reflux disease)    Hearing loss of right ear    Hemifacial spasm    Hyperlipidemia    Hypertension    Insomnia    Lumbago    Osteoarthritis    Paresthesia    Rhinitis    Systemic lupus erythematosus ([place])    Vitamin D deficiency     Past Surgical History:  Procedure Laterality Date   BRAIN SURGERY     CHOLECYSTECTOMY     VAGINAL HYSTERECTOMY      MEDICATIONS:  Prior to Admission medications   Medication Sig Start Date End Date Taking? Authorizing Provider  albuterol (VENTOLIN HFA) 108 (90 Base) MCG/ACT inhaler SMARTSIG:2 Puff(s) By Mouth Every 6 Hours PRN 04/12/21   [provider]  amLODipine (NORVASC) 2.5 MG tablet TAKE 3 TABLETS BY MOUTH DAILY 01/05/22   Steele Sizer, MD  aspirin EC 81 MG tablet Take 1 tablet (81 mg total) by mouth daily. 10/31/17   Steele Sizer, MD  atenolol (TENORMIN) 25 MG tablet  Take 1 tablet (25 mg total) by mouth daily. 12/20/21   Steele Sizer, MD  atorvastatin (LIPITOR) 40 MG tablet TAKE 1 TABLET BY MOUTH  DAILY 01/05/22   Steele Sizer, MD  Biotin 1 MG CAPS Take 1 capsule by mouth daily. 02/07/19   Steele Sizer, MD  buPROPion (WELLBUTRIN XL) 300 MG 24 hr tablet Take 300 mg by mouth in the morning.    [facility]  Cholecalciferol (VITAMIN D) 2000 UNITS CAPS Take 1 capsule by mouth daily.    [provider]  clobetasol ointment (TEMOVATE) 6.94 % Apply 1 application topically 2 (two) times daily. 08/13/21   [provider]  clonazePAM (KLONOPIN) 0.5 MG tablet Take 0.25 mg by mouth at bedtime. 07/07/21   [facility]  clotrimazole-betamethasone (LOTRISONE) cream Apply 1 application topically 2 (two) times daily. 02/14/20   Steele Sizer, MD  diclofenac Sodium (VOLTAREN) 1 % GEL Apply topically daily as needed. 08/18/21   [provider]  famotidine (PEPCID) 20 MG tablet Take by mouth. 06/22/21 08/21/21  [provider]  fluticasone (FLONASE) 50 MCG/ACT nasal spray Place 2 sprays into both nostrils daily. 06/15/21   Steele Sizer, MD  fluticasone (FLOVENT HFA) 220 MCG/ACT inhaler Inhale into the lungs. 07/05/21 07/05/22  [provider]  gabapentin (NEURONTIN) 300 MG capsule Take 300 mg (1 capsule) in the morning and at 300 mg (1 capsule) at noon and 600 mg (2 capsules) at night. 07/06/20   [provider]  hydrALAZINE (APRESOLINE) 10 MG tablet TAKE 1 TABLET BY MOUTH THREE TIMES DAILY AS NEEDED FOR BLOOD PRESSURE ABOVE 150/90 03/31/21   Ancil Boozer, Drue Stager, MD  hydroxychloroquine (PLAQUENIL) 200 MG tablet Take by mouth 2 (two) times daily.    [provider]  hydrOXYzine (ATARAX) 10 MG tablet Take 1 tablet (10 mg total) by mouth 3 (three) times daily as needed. 12/20/21   Steele Sizer, MD  lamoTRIgine (LAMICTAL) 200 MG tablet Take 400 mg by mouth at bedtime.    [facility]  LINZESS 145 MCG CAPS capsule Take 145 mcg by mouth daily. 07/06/21   [provider]  loratadine (CLARITIN) 10 MG tablet Take 1 tablet (10 mg total) by mouth daily. 12/20/21   Steele Sizer, MD  Melatonin 10 MG TABS Take 1 tablet by mouth at bedtime.    [provider]  metFORMIN (GLUCOPHAGE-XR) 500 MG 24 hr tablet Take 1 tablet (500 mg total) by mouth daily with breakfast. 12/20/21   Steele Sizer, MD  mycophenolate (MYFORTIC) 360 MG TBEC EC tablet Take 720 mg by mouth 2 (two) times daily.    Blima Dessert, MD  olopatadine (PATANOL) 0.1 % ophthalmic solution Place 1 drop into both eyes 2 (two) times daily. 12/20/21   Steele Sizer, MD  omeprazole (PRILOSEC) 40 MG capsule TAKE 1 CAPSULE BY MOUTH  DAILY 03/08/20   Steele Sizer, MD  Polyethylene Glycol 3350 (MIRALAX PO) Take by mouth.    [provider]  predniSONE (DELTASONE) 10 MG tablet Take by mouth. 12/29/21   [provider]  telmisartan (MICARDIS) 80 MG tablet TAKE 1 TABLET BY MOUTH DAILY 01/05/22   Steele Sizer, MD  traZODone (DESYREL) 100 MG tablet Take 150 mg by mouth at bedtime.    [provider]  triamcinolone ointment (KENALOG) 0.1 %  10/26/16   [provider]  venlafaxine (EFFEXOR) 100 MG tablet Take 100 mg by mouth every evening. 08/18/21   [facility]    Physical Exam   Triage Vital Signs: ED Triage Vitals  Enc Vitals Group     BP 04/13/22 2219 134/83     Pulse Rate 04/13/22 2219 73     Resp 04/13/22 2219 18     Temp 04/13/22 2219 98 F (36.7 C)     Temp Source 04/13/22 2219 Oral     SpO2 04/13/22 2219 97 %     Weight 04/13/22 2217 246 lb (111.6 kg)     Height 04/13/22 2217 '5\' 8"'$  (1.727 m)     Head Circumference --      Peak Flow --      Pain Score 04/13/22 2217 5     Pain Loc --      Pain Edu? --      Excl. in [place]? --     Most recent vital signs: Vitals:   04/14/22 0200 04/14/22 0300  BP: 125/67 124/62  Pulse: 63 67   Resp: 18 19  Temp:    SpO2: 100% 98%    CONSTITUTIONAL: Alert and oriented and responds appropriately to questions. Well-appearing; well-nourished HEAD: Normocephalic, atraumatic EYES: Conjunctivae clear, pupils appear equal, sclera nonicteric ENT: normal nose; moist mucous membranes NECK: Supple, normal ROM CARD: RRR; S1 and S2 appreciated; no murmurs, no clicks, no rubs, no  gallops, occasional PVCs noted on cardiac monitoring RESP: Normal chest excursion without splinting or tachypnea; breath sounds clear and equal bilaterally; no wheezes, no rhonchi, no rales, no hypoxia or respiratory distress, speaking full sentences ABD/GI: Normal bowel sounds; non-distended; soft, non-tender, no rebound, no guarding, no peritoneal signs BACK: The back appears normal EXT: Normal ROM in all joints; no deformity noted, no edema; no cyanosis, no calf tenderness or calf swelling SKIN: Normal color for age and race; warm; no rash on exposed skin NEURO: Moves all extremities equally, normal speech PSYCH: The patient's mood and manner are appropriate.   ED Results / Procedures / Treatments   LABS: (all labs ordered are listed, but only abnormal results are displayed) Labs Reviewed  BASIC METABOLIC PANEL - Abnormal; Notable for the following components:      Result Value   Glucose, Bld 118 (*)    Creatinine, Ser 1.08 (*)    GFR, Estimated 57 (*)    All other components within normal limits  SARS CORONAVIRUS 2 BY RT PCR  CBC  D-DIMER, QUANTITATIVE  TSH  MAGNESIUM  TROPONIN I (HIGH SENSITIVITY)  TROPONIN I (HIGH SENSITIVITY)     EKG:  EKG Interpretation  Date/Time:  Wednesday April 13 2022 22:26:13 EDT Ventricular Rate:  67 PR Interval:  144 QRS Duration: 96 QT Interval:  388 QTC Calculation: 409 R Axis:   -2 Text Interpretation: Normal sinus rhythm Moderate voltage criteria for LVH, may be normal variant ( R in aVL , Cornell product ) Cannot rule out Anterior infarct , age  undetermined Abnormal ECG No significant change since last tracing Confirmed by Pryor Curia [phone]) on 04/14/2022 1:23:40 AM         RADIOLOGY: My personal review and interpretation of imaging: Chest x-ray clear.  I have personally reviewed all radiology reports.   DG Chest 2 View  Result Date: 04/13/2022 CLINICAL DATA:  Chest pain EXAM: CHEST - 2 VIEW COMPARISON:  04/05/2022 FINDINGS: Lungs volumes are small, but are symmetric and are clear. No pneumothorax or pleural effusion. Cardiac size within normal limits. Pulmonary vascularity is normal. Osseous structures are age-appropriate. No acute bone abnormality. IMPRESSION: Pulmonary hypoinflation. Electronically Signed   By: Fidela Salisbury M.D.   On: 04/13/2022 22:53     PROCEDURES:  Critical Care performed: No     .1-3 Lead EKG Interpretation  Performed by: Kataleyah Carducci, Delice Bison, DO Authorized by: Deliyah Muckle, Delice Bison, DO     Interpretation: normal     ECG rate:  67   ECG rate assessment: normal     Rhythm: sinus rhythm     Ectopy: PVCs     Conduction: normal       IMPRESSION / MDM / ASSESSMENT AND PLAN / ED COURSE  I reviewed the triage vital signs and the nursing notes.    Patient with palpitations.  While on cardiac monitoring patient had symptoms while I was in the room and appeared to have several PVCs but none in a row.  The patient is on the cardiac monitor to evaluate for evidence of arrhythmia and/or significant heart rate changes.   DIFFERENTIAL DIAGNOSIS (includes but not limited to):   Palpitations, PVCs, other arrhythmia, electrolyte derangement, anemia, dehydration, thyroid dysfunction, ACS, PE   Patient's presentation is most consistent with acute presentation with potential threat to life or bodily function.   PLAN: Will obtain CBC, BMP, magnesium level, TSH, troponin, D-dimer, chest x-ray.  EKG nonischemic.   MEDICATIONS GIVEN IN ED: Medications  sodium  chloride 0.9 % bolus 500 mL (0 mLs Intravenous  Stopped 04/14/22 0315)     ED COURSE: Patient's labs show no leukocytosis, normal hemoglobin, normal electrolytes.  Minimally elevated creatinine which appears to be around her baseline but slightly elevated compared to prior.  Will give small fluid bolus.  Troponin x 2 is negative.  D-dimer negative.  TSH normal.  I did check a COVID test given she has cough and is immunocompromise but this was negative.  Chest x-ray reviewed and interpreted by myself and the radiologist and shows no infiltrate, edema, pneumothorax.  She states she is feeling better after IV fluids and we have not noted any further cardiac arrhythmias on the monitor.  I have placed a referral for cardiology.  Advised her to continue her atenolol as prescribed.  I do not feel that this should be adjusted from the ED as she does have some heart rates in the low 60s already.  Patient is comfortable with this plan.  Advise she avoid caffeine and other stimulants.  Discussed return precautions.   At this time, I do not feel there is any life-threatening condition present. I reviewed all nursing notes, vitals, pertinent previous records.  All lab and urine results, EKGs, imaging ordered have been independently reviewed and interpreted by myself.  I reviewed all available radiology reports from any imaging ordered this visit.  Based on my assessment, I feel the patient is safe to be discharged home without further emergent workup and can continue workup as an outpatient as needed. Discussed all findings, treatment plan as well as usual and customary return precautions.  They verbalize understanding and are comfortable with this plan.  Outpatient follow-up has been provided as needed.  All questions have been answered.    CONSULTS: Patient appropriate for further outpatient management especially given reassuring workup with occasional PVCs noted on cardiac monitoring.   OUTSIDE RECORDS REVIEWED: Reviewed patient's last pulmonology and  rheumatology visits at [facility] in June 2023.       FINAL CLINICAL IMPRESSION(S) / ED DIAGNOSES   Final diagnoses:  PVC's (premature ventricular contractions)     Rx / DC Orders   ED Discharge Orders          Ordered    Ambulatory referral to Cardiology        04/14/22 0148             Note:  This document was prepared using Dragon voice recognition software and may include unintentional dictation errors.   Eesa Justiss, Delice Bison, DO 04/14/22 [phone]

## 2022-04-14 NOTE — Telephone Encounter (Signed)
Pt is scheduled for ER fu for tomorrow. Pt is ok with waiting and per our conversation she is feeling fine right now. Advised her if she gets to feeling bad again to be sure to go back to the ER. Pt states she would do that

## 2022-04-14 NOTE — Telephone Encounter (Signed)
Chief Complaint: palpitations, denies chest pain , just an usual feeling in chest Symptoms: lightheadedness, dizziness, new cough, nausea, vertigo Frequency: last Tuesday Pertinent Negatives: Patient denies sweating, SOB Disposition: '[x]'$ ED /'[]'$ Urgent Care (no appt availability in office) / '[]'$ Appointment(In office/virtual)/ '[]'$  [place] [facility]/ '[]'$ Home Care/ '[]'$ Refused Recommended Disposition /'[]'$ [place] [facility]/ '[]'$  Follow-up with PCP Additional Notes: called office and spoke to [facility] regarding pt's assessment. Advised that pt cannot have OV and ED visit on the same day. Advised pt to call 911 if sx worsen. Refused to see another provider other than PCP> Pt asking for cardiology consult.  Pt put on wait list. Reason for Disposition  Dizziness, lightheadedness, or weakness  Answer Assessment - Initial Assessment Questions 1. DESCRIPTION: "Please describe your heart rate or heartbeat that you are having" (e.g., fast/slow, regular/irregular, skipped or extra beats, "palpitations")     Palpitation  2. ONSET: "When did it start?" (Minutes, hours or days)      Last Tuesday 3. DURATION: "How long does it last" (e.g., seconds, minutes, hours)     Comes and goes 4. PATTERN "Does it come and go, or has it been constant since it started?"  "Does it get worse with exertion?"   "Are you feeling it now?"     Come and goes 5. TAP: "Using your hand, can you tap out what you are feeling on a chair or table in front of you, so that I can hear?" (Note: not all patients can do this)       *No Answer* 6. HEART RATE: "Can you tell me your heart rate?" "How many beats in 15 seconds?"  (Note: not all patients can do this)       unsure 7. RECURRENT SYMPTOM: "Have you ever had this before?" If Yes, ask: "When was the last time?" and "What happened that time?"      yes 8. CAUSE: "What do you think is causing the palpitations?"     unknown 9. CARDIAC HISTORY: "Do you have any history of heart disease?"  (e.g., heart attack, angina, bypass surgery, angioplasty, arrhythmia)      HTN 10. OTHER SYMPTOMS: "Do you have any other symptoms?" (e.g., dizziness, chest pain, sweating, difficulty breathing)       No- cough, dizzy, lightheaded, nausea, vertigo 11. PREGNANCY: "Is there any chance you are pregnant?" "When was your last menstrual period?"       N/a  Answer Assessment - Initial Assessment Questions 1. LOCATION: "Where does it hurt?"       Middle chest and stomach, cough 2. RADIATION: "Does the pain go anywhere else?" (e.g., into neck, jaw, arms, back)     Stomach, throat 3. ONSET: "When did the chest pain begin?" (Minutes, hours or days)      Last week 4. PATTERN: "Does the pain come and go, or has it been constant since it started?"  "Does it get worse with exertion?"      Comes and goes- worse with palpitation 5. DURATION: "How long does it last" (e.g., seconds, minutes, hours)     Comes and goes happening ore often 6. SEVERITY: "How bad is the pain?"  (e.g., Scale 1-10; mild, moderate, or severe)    - MILD (1-3): doesn't interfere with normal activities     - MODERATE (4-7): interferes with normal activities or awakens from sleep    - SEVERE (8-10): excruciating pain, unable to do any normal activities       No pain 7. CARDIAC RISK FACTORS: "  Do you have any history of heart problems or risk factors for heart disease?" (e.g., angina, prior heart attack; diabetes, high blood pressure, high cholesterol, smoker, or strong family history of heart disease)     Mom heart disease, lung disease from L[place], borderline diabetes, HTN 8. PULMONARY RISK FACTORS: "Do you have any history of lung disease?"  (e.g., blood clots in lung, asthma, emphysema, birth control pills)     L[place]d 9. CAUSE: "What do you think is causing the chest pain?"     palpitation 10. OTHER SYMPTOMS: "Do you have any other symptoms?" (e.g., dizziness, nausea, vomiting, sweating, fever, difficulty breathing, cough)        palpitations 11. PREGNANCY: "Is there any chance you are pregnant?" "When was your last menstrual period?"       N/a  Protocols used: Chest Pain-A-AH, Heart Rate and Heartbeat Questions-A-AH

## 2022-04-14 NOTE — Discharge Instructions (Addendum)
Your blood work, chest x-ray today were reassuring.  You had PVCs noted on cardiac monitoring.  I recommend you avoid caffeine and other stimulants like over-the-counter decongestants, energy drinks.  Cardiology will call you for outpatient follow-up.  Please return to the ED if you begin having chest pressure, shortness of breath, feel like you are going to pass out or if you pass out, sudden sweating, or any other symptom concerning to you.

## 2022-04-14 NOTE — Progress Notes (Signed)
Name: Nicole Ballard   MRN: [id number]    DOB: 1956-03-02   Date:04/15/2022       Progress Note  Subjective  Chief Complaint  ER Follow Up  HPI  04/05/22 & 04/14/22 for chest pain and palpitation  Palpitation started back on 04/05/2022, developed palpitation/fluttering in her chest , associated with anterior chest pain that happened multiple times throughout the day so she went to [facility], vital signs or normal, troponin negative and sent home with advised to follow up with cardiologist, it happened again 04/13/2022. She states doctor explained to her it was PVC and was going to place her on a beta blocker but since she was already on Atenolol she was given reassurance and sent home.   Echo done 08/29/2021 at [facility]  1. The left ventricular systolic function is normal, LVEF is visually estimated at 60-65%.  2. The right ventricle is normal in size, with normal systolic function.  3. The pulmonary systolic pressure cannot be estimated due to insufficient TR signal.    Patient Active Problem List   Diagnosis Date Noted   Immunocompromised ([place]) 04/29/2021   Hyperglycemia 10/23/2017   Vertigo 09/29/2016   Bronchiolitis obliterans organizing pneumonia ([place]) 09/29/2016   Neck pain, musculoskeletal 12/15/2015   Abnormal CAT scan 04/11/2015   Auditory impairment 04/11/2015   Insomnia, persistent 04/11/2015   Chronic kidney disease (CKD), stage I 04/11/2015   Chronic nonmalignant pain 04/11/2015   Chronic constipation 04/11/2015   Dyslipidemia 04/11/2015   Fibromyalgia 04/11/2015   Gastro-esophageal reflux disease without esophagitis 04/11/2015   Dysphonia 04/11/2015   Low back pain 04/11/2015   Eczema intertrigo 04/11/2015   Keratosis pilaris 04/11/2015   Chronic recurrent major depressive disorder ([place]) 05/28/3006   Dysmetabolic syndrome 62/26/3335   Neurogenic claudication 04/11/2015   Perennial allergic rhinitis with seasonal variation 04/11/2015   Abnormal presence of  protein in urine 04/11/2015   Seborrhea capitis 04/11/2015   Moderate dysplasia of vulva 04/11/2015   Vitamin D deficiency 04/11/2015   Spinal stenosis of lumbar region 04/11/2015   Treadmill stress test negative for angina pectoris 03/05/2015   Shortness of breath 05/20/2014   Asthma, chronic 12/31/2013   H/O total knee replacement 12/31/2013   Episcleritis 09/26/2013   Vocal cord dysfunction 05/28/2013   Anxiety, generalized 03/19/2013   Back pain 12/18/2012   Pulmonary nodules 11/26/2012   Lung nodule, multiple 11/26/2012   Arthritis, degenerative 11/01/2012   H/O aspiration pneumonitis 10/22/2012   Postinflammatory pulmonary fibrosis ([place]) 10/22/2012   Mixed incontinence 05/04/2012   Lung involvement in systemic lupus erythematosus ([place]) 11/05/2010   Chronic nausea 07/01/2008   Benign hypertension 01/25/2005    Past Surgical History:  Procedure Laterality Date   BRAIN SURGERY     CHOLECYSTECTOMY     VAGINAL HYSTERECTOMY      Family History  Problem Relation Age of Onset   Breast cancer Mother    Cancer Mother 36       Breast   Cancer Father 56       Stomach   Breast cancer Maternal Aunt     Social History   Tobacco Use   Smoking status: Never   Smokeless tobacco: Never  Substance Use Topics   Alcohol use: No    Alcohol/week: 0.0 standard drinks of alcohol     Current Outpatient Medications:    albuterol (VENTOLIN HFA) 108 (90 Base) MCG/ACT inhaler, SMARTSIG:2 Puff(s) By Mouth Every 6 Hours PRN, Disp: , Rfl:    amLODipine (  NORVASC) 2.5 MG tablet, TAKE 3 TABLETS BY MOUTH DAILY, Disp: 270 tablet, Rfl: 3   aspirin EC 81 MG tablet, Take 1 tablet (81 mg total) by mouth daily., Disp: 30 tablet, Rfl: 0   atenolol (TENORMIN) 25 MG tablet, Take 1 tablet (25 mg total) by mouth daily., Disp: 90 tablet, Rfl: 3   atorvastatin (LIPITOR) 40 MG tablet, TAKE 1 TABLET BY MOUTH  DAILY, Disp: 90 tablet, Rfl: 3   Biotin 1 MG CAPS, Take 1 capsule by mouth daily., Disp: 30  capsule, Rfl: 0   buPROPion (WELLBUTRIN XL) 300 MG 24 hr tablet, Take 300 mg by mouth in the morning., Disp: , Rfl:    Cholecalciferol (VITAMIN D) 2000 UNITS CAPS, Take 1 capsule by mouth daily., Disp: , Rfl:    clobetasol ointment (TEMOVATE) 3.81 %, Apply 1 application topically 2 (two) times daily., Disp: , Rfl:    clonazePAM (KLONOPIN) 0.5 MG tablet, Take 0.25 mg by mouth at bedtime., Disp: , Rfl:    clotrimazole-betamethasone (LOTRISONE) cream, Apply 1 application topically 2 (two) times daily., Disp: 45 g, Rfl: 0   diclofenac Sodium (VOLTAREN) 1 % GEL, Apply topically daily as needed., Disp: , Rfl:    fluticasone (FLONASE) 50 MCG/ACT nasal spray, Place 2 sprays into both nostrils daily., Disp: 48 g, Rfl: 3   fluticasone (FLOVENT HFA) 220 MCG/ACT inhaler, Inhale into the lungs., Disp: , Rfl:    gabapentin (NEURONTIN) 300 MG capsule, Take 300 mg (1 capsule) in the morning and at 300 mg (1 capsule) at noon and 600 mg (2 capsules) at night., Disp: , Rfl:    hydrALAZINE (APRESOLINE) 10 MG tablet, TAKE 1 TABLET BY MOUTH THREE TIMES DAILY AS NEEDED FOR BLOOD PRESSURE ABOVE 150/90, Disp: 90 tablet, Rfl: 2   hydroxychloroquine (PLAQUENIL) 200 MG tablet, Take by mouth 2 (two) times daily., Disp: , Rfl:    hydrOXYzine (ATARAX) 10 MG tablet, Take 1 tablet (10 mg total) by mouth 3 (three) times daily as needed., Disp: 30 tablet, Rfl: 0   lamoTRIgine (LAMICTAL) 200 MG tablet, Take 400 mg by mouth at bedtime., Disp: , Rfl:    LINZESS 145 MCG CAPS capsule, Take 145 mcg by mouth daily., Disp: , Rfl:    loratadine (CLARITIN) 10 MG tablet, Take 1 tablet (10 mg total) by mouth daily., Disp: 90 tablet, Rfl: 1   Melatonin 10 MG TABS, Take 1 tablet by mouth at bedtime., Disp: , Rfl:    metFORMIN (GLUCOPHAGE-XR) 500 MG 24 hr tablet, Take 1 tablet (500 mg total) by mouth daily with breakfast., Disp: 90 tablet, Rfl: 1   mycophenolate (MYFORTIC) 360 MG TBEC EC tablet, Take 720 mg by mouth 2 (two) times daily., Disp: ,  Rfl:    olopatadine (PATANOL) 0.1 % ophthalmic solution, Place 1 drop into both eyes 2 (two) times daily., Disp: 5 mL, Rfl: 1   omeprazole (PRILOSEC) 40 MG capsule, TAKE 1 CAPSULE BY MOUTH  DAILY, Disp: 90 capsule, Rfl: 3   Polyethylene Glycol 3350 (MIRALAX PO), Take by mouth., Disp: , Rfl:    predniSONE (DELTASONE) 10 MG tablet, Take by mouth., Disp: , Rfl:    telmisartan (MICARDIS) 80 MG tablet, TAKE 1 TABLET BY MOUTH DAILY, Disp: 90 tablet, Rfl: 3   traZODone (DESYREL) 100 MG tablet, Take 150 mg by mouth at bedtime., Disp: , Rfl:    triamcinolone ointment (KENALOG) 0.1 %, , Disp: , Rfl:    venlafaxine (EFFEXOR) 100 MG tablet, Take 100 mg by mouth every evening.,  Disp: , Rfl:    famotidine (PEPCID) 20 MG tablet, Take by mouth., Disp: , Rfl:   Allergies  Allergen Reactions   Ace Inhibitors     Other reaction(s): OTHER Pt states she can not take ace inhibitors. Pt states she can not remember her reaction.    Bee Pollen    Penicillins    Pollen Extract     I personally reviewed active problem list, medication list, allergies, family history, social history, health maintenance with the patient/caregiver today.   ROS  Constitutional: Negative for fever or weight change.  Respiratory: positive  for cough and shortness of breath.   Cardiovascular: Negative for chest pain or palpitations.  Gastrointestinal: Negative for abdominal pain, no bowel changes.  Musculoskeletal: Negative for gait problem or joint swelling.  Skin: Negative for rash.  Neurological: positive for intermittent  dizziness but on  headache.  No other specific complaints in a complete review of systems (except as listed in HPI above).   Objective  Vitals:   04/15/22 0919  BP: 130/68  Pulse: 87  Resp: 16  SpO2: 97%  Weight: 246 lb (111.6 kg)  Height: '5\' 8"'$  (1.727 m)    Body mass index is 37.4 kg/m.  Physical Exam  Constitutional: Patient appears well-developed and well-nourished. Obese  No distress.   HEENT: head atraumatic, normocephalic, pupils equal and reactive to light, neck supple Cardiovascular: Normal rate, regular rhythm and normal heart sounds.  No murmur heard. No BLE edema. Pulmonary/Chest: Effort normal and breath sounds normal. No respiratory distress. Abdominal: Soft.  There is no tenderness. Psychiatric: Patient has a normal mood and affect. behavior is normal. Judgment and thought content normal.   Recent Results (from the past 2160 hour(s))  Basic metabolic panel     Status: Abnormal   Collection Time: 04/05/22  3:55 PM  Result Value Ref Range   Sodium 139 135 - 145 mmol/L   Potassium 4.7 3.5 - 5.1 mmol/L   Chloride 100 98 - 111 mmol/L   CO2 31 22 - 32 mmol/L   Glucose, Bld 74 70 - 99 mg/dL    Comment: Glucose reference range applies only to samples taken after fasting for at least 8 hours.   BUN 15 8 - 23 mg/dL   Creatinine, Ser 1.01 (H) 0.44 - 1.00 mg/dL   Calcium 9.5 8.9 - 10.3 mg/dL   GFR, Estimated >60 >60 mL/min    Comment: (NOTE) Calculated using the CKD-EPI Creatinine Equation (2021)    Anion gap 8 5 - 15    Comment: Performed at [facility], [address] [place], [place] [address]  CBC     Status: Abnormal   Collection Time: 04/05/22  3:55 PM  Result Value Ref Range   WBC 8.2 4.0 - 10.5 K/uL   RBC 5.19 (H) 3.87 - 5.11 MIL/uL   Hemoglobin 14.1 12.0 - 15.0 g/dL   HCT 45.4 36.0 - 46.0 %   MCV 87.5 80.0 - 100.0 fL   MCH 27.2 26.0 - 34.0 pg   MCHC 31.1 30.0 - 36.0 g/dL   RDW 13.9 11.5 - 15.5 %   Platelets 408 (H) 150 - 400 K/uL   nRBC 0.0 0.0 - 0.2 %    Comment: Performed at [facility], [address] [place], [place] [address]  Troponin I (High Sensitivity)     Status: None   Collection Time: 04/05/22  3:55 PM  Result Value Ref Range   Troponin I (High Sensitivity) 4 <18  ng/L    Comment: (NOTE) Elevated high sensitivity troponin I (hsTnI) values and significant  changes across serial measurements may suggest ACS but  many other  chronic and acute conditions are known to elevate hsTnI results.  Refer to the "Links" section for chest pain algorithms and additional  guidance. Performed at [facility], [place], [place] [address]   Troponin I (High Sensitivity)     Status: None   Collection Time: 04/05/22  7:14 PM  Result Value Ref Range   Troponin I (High Sensitivity) 4 <18 ng/L    Comment: (NOTE) Elevated high sensitivity troponin I (hsTnI) values and significant  changes across serial measurements may suggest ACS but many other  chronic and acute conditions are known to elevate hsTnI results.  Refer to the "Links" section for chest pain algorithms and additional  guidance. Performed at [facility] [address] [place], [place] [address]   CBC     Status: None   Collection Time: 04/13/22 10:28 PM  Result Value Ref Range   WBC 10.4 4.0 - 10.5 K/uL   RBC 5.00 3.87 - 5.11 MIL/uL   Hemoglobin 13.6 12.0 - 15.0 g/dL   HCT 44.0 36.0 - 46.0 %   MCV 88.0 80.0 - 100.0 fL   MCH 27.2 26.0 - 34.0 pg   MCHC 30.9 30.0 - 36.0 g/dL   RDW 14.1 11.5 - 15.5 %   Platelets 363 150 - 400 K/uL   nRBC 0.0 0.0 - 0.2 %    Comment: Performed at [facility], [address], [place], [place] [address]  Basic metabolic panel     Status: Abnormal   Collection Time: 04/13/22 10:28 PM  Result Value Ref Range   Sodium 141 135 - 145 mmol/L   Potassium 3.8 3.5 - 5.1 mmol/L   Chloride 104 98 - 111 mmol/L   CO2 29 22 - 32 mmol/L   Glucose, Bld 118 (H) 70 - 99 mg/dL    Comment: Glucose reference range applies only to samples taken after fasting for at least 8 hours.   BUN 13 8 - 23 mg/dL   Creatinine, Ser 1.08 (H) 0.44 - 1.00 mg/dL   Calcium 9.2 8.9 - 10.3 mg/dL   GFR, Estimated 57 (L) >60 mL/min    Comment: (NOTE) Calculated using the CKD-EPI Creatinine Equation (2021)    Anion gap 8 5 - 15    Comment: Performed at [facility], [place], [place] [address]  Troponin I (High Sensitivity)     Status: None   Collection Time: 04/13/22 10:28 PM  Result Value Ref Range   Troponin I (High Sensitivity) 5 <18 ng/L    Comment: (NOTE) Elevated high sensitivity troponin I (hsTnI) values and significant  changes across serial measurements may suggest ACS but many other  chronic and acute conditions are known to elevate hsTnI results.  Refer to the "Links" section for chest pain algorithms and additional  guidance. Performed at [facility], [place],  [address]   Troponin I (High Sensitivity)     Status: None   Collection Time: 04/14/22  1:38 AM  Result Value Ref Range   Troponin I (High Sensitivity) 5 <18 ng/L    Comment: (NOTE) Elevated high sensitivity troponin I (hsTnI) values and significant  changes across serial measurements may suggest ACS but many other  chronic and acute conditions are known to elevate hsTnI results.  Refer to the "  Links" section for chest pain algorithms and additional  guidance. Performed at [facility], [address] [place], [place] [address]   D-dimer, quantitative     Status: None   Collection Time: 04/14/22  1:38 AM  Result Value Ref Range   D-Dimer, Quant 0.48 0.00 - 0.50 ug/mL-FEU    Comment: (NOTE) At the manufacturer cut-off value of 0.5 g/mL FEU, this assay has a negative predictive value of 95-100%.This assay is intended for use in conjunction with a clinical pretest probability (PTP) assessment model to exclude pulmonary embolism (PE) and deep venous thrombosis (DVT) in outpatients suspected of PE or DVT. Results should be correlated with clinical presentation. Performed at [facility], [address] [place], [place] [address]   TSH     Status: None   Collection Time: 04/14/22  1:38 AM  Result Value Ref Range   TSH 1.920 0.350 - 4.500 uIU/mL    Comment: Performed by a 3rd Generation assay with a functional sensitivity  of <=0.01 uIU/mL. Performed at [facility], [address] [place], [place] [address]   Magnesium     Status: None   Collection Time: 04/14/22  1:38 AM  Result Value Ref Range   Magnesium 2.1 1.7 - 2.4 mg/dL    Comment: Performed at [facility], [address] [place], [place] [address]  SARS Coronavirus 2 by RT PCR (hospital order, performed in [facility] hospital lab) *cepheid single result test* Anterior Nasal Swab     Status: None   Collection Time: 04/14/22  2:13 AM   Specimen: Anterior Nasal Swab  Result Value Ref Range   SARS Coronavirus 2 by RT PCR NEGATIVE NEGATIVE    Comment: (NOTE) SARS-CoV-2 target nucleic acids are NOT DETECTED.  The SARS-CoV-2 RNA is generally detectable in upper and lower respiratory specimens during the acute phase of infection. The lowest concentration of SARS-CoV-2 viral copies this assay can detect is 250 copies / mL. A negative result does not preclude SARS-CoV-2 infection and should not be used as the sole basis for treatment or other patient management decisions.  A negative result may occur with improper specimen collection / handling, submission of specimen other than nasopharyngeal swab, presence of viral mutation(s) within the areas targeted by this assay, and inadequate number of viral copies (<250 copies / mL). A negative result must be combined with clinical observations, patient history, and epidemiological information.  Fact Sheet for Patients:   https://www.patel.info/  Fact Sheet for Healthcare Providers: https://hall.com/  This test is not yet approved or  cleared by the [place] FDA and has been authorized for detection and/or diagnosis of SARS-CoV-2 by FDA under an Emergency Use Authorization (EUA).  This EUA will remain in effect (meaning this test can be used) for the duration of the COVID-19 declaration under Section 564(b)(1) of the Act, 21  U.S.C. section 360bbb-3(b)(1), unless the authorization is terminated or revoked sooner.  Performed at [facility], [address] [place], [place] [address]     PHQ2/9:    04/15/2022    9:23 AM 04/12/2022    7:37 AM 03/15/2022    8:14 AM 02/17/2022   11:16 AM 12/20/2021    7:46 AM  Depression screen PHQ 2/9  Decreased Interest '3 1 1 1 '$ 0  Down, Depressed, Hopeless '3 1 1 1 '$ 0  PHQ - 2 Score '6 2 2 2 '$ 0  Altered sleeping 1 0 2 2 0  Tired, decreased energy 1 2 1  1 0  Change in appetite 1 3 0 0 0  Feeling bad or failure about yourself  '1 1 1 1 3  '$ Trouble concentrating 1 1 0 2 0  Moving slowly or fidgety/restless 0 '1 1 1 '$ 0  Suicidal thoughts 0 0 0 0 0  PHQ-9 Score '11 10 7 9 3  '$ Difficult doing work/chores  Very difficult Somewhat difficult Somewhat difficult     phq 9 is positive   Fall Risk:    04/15/2022    9:19 AM 02/02/2022    2:12 PM 12/20/2021    7:46 AM 10/01/2021    9:46 AM 09/07/2021   11:13 AM  Fall Risk   Falls in the past year? 0 1 0 0 0  Number falls in past yr: 0 0 0 0 0  Injury with Fall? 0 0 0 0 0  Comment  walking shoe hit cement and tripped me to a fall     Risk for fall due to : No Fall Risks Medication side effect No Fall Risks No Fall Risks No Fall Risks  Follow up Falls prevention discussed Falls prevention discussed;Education provided Falls prevention discussed Falls prevention discussed Falls prevention discussed      Functional Status Survey: Is the patient deaf or have difficulty hearing?: Yes Does the patient have difficulty seeing, even when wearing glasses/contacts?: No Does the patient have difficulty concentrating, remembering, or making decisions?: Yes Does the patient have difficulty walking or climbing stairs?: Yes Does the patient have difficulty dressing or bathing?: No Does the patient have difficulty doing errands alone such as visiting a doctor's office or shopping?: No    Assessment & Plan  1. PVC (premature ventricular  contraction)  - metoprolol succinate (TOPROL-XL) 25 MG 24 hr tablet; Take 1 tablet (25 mg total) by mouth daily. In place of Atenolol  Dispense: 30 tablet; Refill: 0  Cardiologist referral   2. Palpitation  - metoprolol succinate (TOPROL-XL) 25 MG 24 hr tablet; Take 1 tablet (25 mg total) by mouth daily. In place of Atenolol  Dispense: 30 tablet; Refill: 0  3. Chest pain in adult

## 2022-04-14 NOTE — Unmapped (Signed)
Upcoming Appt:  Future Appointments   Date Time Provider Department Center   04/18/2022  8:00 AM [facility] DEXA RM 1 IDEXAHBR [facility] [facility]   04/20/2022  8:30 AM Leilani Able, MD UNCPULSPCLET [facility]   04/28/2022 11:30 AM Charlane Ferretti, MD OPTCVilcom [facility]   05/24/2022  9:00 AM Amy Elizebeth Brooking, PhD ANESPAINMRKT [facility]   06/09/2022  9:15 AM Hardie Pulley, MD [facility]   06/24/2022 11:15 AM Charlane Ferretti, MD [facility]   06/30/2022 11:20 AM Rumey Sherie Don, MD UNCRHUSPECET [facility]   08/26/2022 10:00 AM UNCCA MAMMO RM 1 IMMUCA [place]   08/26/2022 11:00 AM Genia Del, ANP SURONC [facility]       Recent:   What is the date of your last related visit?  04/05/22 [facility], DX cardiac work up, referral to cardiac  Related acute medications Rx'd:  none  Home treatment tried:  none      Relevant:   Allergies: Vilazodone, Ace inhibitors, Bee pollen, Penicillin g, Penicillins, and Pollen extracts  Medications: omeprazole  Health History: lupus, lung disease, hypertension, fibromyalgia  Weight: n/a      [place] patients only:  What was the date of your last cancer treatment (mm/dd/yy)?: n/a  Was the treatment oral or infusion?: n/a  Are you currently on TVEC (yes/no)?: n/a  Reason for Disposition   Dizziness, lightheadedness, or weakness    Answer Assessment - Initial Assessment Questions  1. DESCRIPTION: Please describe your heart rate or heartbeat that you are having (e.g., fast/slow, regular/irregular, skipped or extra beats, palpitations)      Fluttering, fast heart beat  2. ONSET: When did it start? (Minutes, hours or days)       Off and on all day  3. DURATION: How long does it last (e.g., seconds, minutes, hours)      Lasts a few seconds and comes right back  4. PATTERN Does it come and go, or has it been constant since it started?  Does it get worse with exertion?   Are you feeling it now?      Comes and goes, Today more prevalent. Feeling it now  5. TAP: Using your hand, can you tap out what you are feeling on a chair or table in front of you, so that I can hear? (Note: not all patients can do this)        N/A  6. HEART RATE: Can you tell me your heart rate? How many beats in 15 seconds?  (Note: not all patients can do this)        78, 163/90  7. RECURRENT SYMPTOM: Have you ever had this before? If Yes, ask: When was the last time? and What happened that time?       Last week seen in ER for cardiac work up,   8. CAUSE: What do you think is causing the palpitations?      unsure  9. CARDIAC HISTORY: Do you have any history of heart disease? (e.g., heart attack, angina, bypass surgery, angioplasty, arrhythmia)       hypertension  10. OTHER SYMPTOMS: Do you have any other symptoms? (e.g., dizziness, chest pain, sweating, difficulty breathing)        Home alone. When palpitations come feel it in chest. Denies pain in chest and back. Short of breath from chronic lung disease. Dizziness/lightheadedness. Drinking fluids, urinating normally  11. PREGNANCY: Is there any chance you are pregnant? When  was your last menstrual period?        N/A    Protocols used: Heart Rate and Heartbeat Questions-A-AH

## 2022-04-15 ENCOUNTER — Ambulatory Visit (INDEPENDENT_AMBULATORY_CARE_PROVIDER_SITE_OTHER): Payer: Medicare Other | Admitting: Family Medicine

## 2022-04-15 ENCOUNTER — Encounter: Payer: Self-pay | Admitting: Family Medicine

## 2022-04-15 ENCOUNTER — Other Ambulatory Visit: Payer: Self-pay | Admitting: Family Medicine

## 2022-04-15 VITALS — BP 130/68 | HR 87 | Resp 16 | Ht 68.0 in | Wt 246.0 lb

## 2022-04-15 DIAGNOSIS — I493 Ventricular premature depolarization: Secondary | ICD-10-CM | POA: Diagnosis not present

## 2022-04-15 DIAGNOSIS — R079 Chest pain, unspecified: Secondary | ICD-10-CM

## 2022-04-15 DIAGNOSIS — R002 Palpitations: Secondary | ICD-10-CM

## 2022-04-15 MED ORDER — METOPROLOL SUCCINATE ER 25 MG PO TB24: 25.0000 mg | ORAL_TABLET | Freq: Every day | ORAL | 0 refills | Status: DC

## 2022-04-18 ENCOUNTER — Ambulatory Visit: Admit: 2022-04-18 | Discharge: 2022-04-19 | Payer: MEDICARE

## 2022-04-19 ENCOUNTER — Encounter: Payer: Medicare Other | Admitting: *Deleted

## 2022-04-19 DIAGNOSIS — J849 Interstitial pulmonary disease, unspecified: Secondary | ICD-10-CM

## 2022-04-19 NOTE — Progress Notes (Signed)
Daily Session Note  Patient Details  Name: Nicole Ballard MRN: [id number] Date of Birth: [birth year] Referring Provider:   Flowsheet Row Pulmonary Rehab from 02/17/2022 in [facility] Cardiac and Pulmonary Rehab  Referring Provider Despina Arias MD       Encounter Date: 04/19/2022  Check In:  Session Check In - 04/19/22 0838       Check-In   Supervising physician immediately available to respond to emergencies See telemetry face sheet for immediately available ER MD    Location [facility]-Cardiac & Pulmonary Rehab    Staff Present Heath Lark, RN, BSN, CCRP;Jessica [facility], MA, RCEP, CCRP, [place], BS, ACSM CEP, Exercise Physiologist    Virtual Visit No    Medication changes reported     No    Fall or balance concerns reported    No    Warm-up and Cool-down Performed on first and last piece of equipment    Resistance Training Performed Yes    VAD Patient? No    PAD/SET Patient? No      Pain Assessment   Currently in Pain? No/denies                Social History   Tobacco Use  Smoking Status Never  Smokeless Tobacco Never    Goals Met:  Proper associated with RPD/PD & O2 Sat Independence with exercise equipment Exercise tolerated well No report of concerns or symptoms today  Goals Unmet:  Not Applicable  Comments: Pt able to follow exercise prescription today without complaint.  Will continue to monitor for progression.    Dr. Emily Filbert is Medical Director for [facility].  Dr. Ottie Glazier is Medical Director for [facility] Pulmonary Rehabilitation.

## 2022-04-19 NOTE — Unmapped (Signed)
Pulmonary Clinic - Follow-up Visit      HISTORY:     Active Pulmonary Problems & Brief History:  Caitlin Smith is a 66 y.o. woman with many medical issues here for follow up of follow up for organizing pneumonia.     Interval History:  - 2016 and prior - patient followed at [facility]. They thought her symptoms of dyspnea were from obesity and asthma and recommended ENT evaluation. They also stated concern for chronic aspiration.   - 04/2016 - patient in [facility] system. She was thought to have multifactorial dyspnea, waxing and waning pulmonary opacities. Evaluated by pulmonary hypertension team without evidence of pulmonary hypertension. Underwent bronchoscopy with BAL and transbronchial biopsies which showed significant inflammation. DDX remained broad - CEP, COP, SLE pneumonitis, ABPA, EGPA, AEP, but it was decided to treat her with prolonged steroid taper.   - 08/23/16 - last visit with Dr. Johnnette Barrios - was improving with steroid taper   - 10/20/16 - established with Dr. Nilsa Nutting - there had been question of compliance with her prednisone since last visit, but by time of visit she had been on it consistently for one month. No clear improvement in dyspnea, but remains inactive.   - 02/09/17 - follow up - spirometry showed improvement in FVC and DLCOafter 2 months of steroid, had tapered off by time of visit. When she stopped the prednisone she developed increased cough, chest tightness, dyspnea.   - 03/27/17 - follow up - given steroid dependence was placed on MMF.   - 10/16/17 - follow up - dose reduced MMF for GI side effects. Prolonged steroid taper over this time.   - 02/06/2019 - last visit with pulmonary, Dr. Judeth Horn - at that time had gone to local ED for chest pain, CT chest showed interstitial lung disease in the lung bases, favored to reflect mild progression of chronic fibrotic nonspecific interstitial pneumonia (NSIP). It was unclear to Dr. Judeth Horn if that represented actual progression of her underlying disease.   - 03/03/21 - patient reports she has dyspnea on exertion not associated with chest pain. Not currently using any inhalers. Still has some chronic cough, but ENT thinks she's having reflux so has her on PPI + H2 blocker. Cough non-productive. No F/C/NS, N/V/D, upset tummy. Sleeps laying flat, no nocturnal awakenings. Reports negative sleep studies for OSA. No LE edema. Wanted to repeat PFTs in 4 months given mildly reduced compared to prior. If stable/improved at next visit then would reach out to rheum to see about weaning MMF.   - 06/08/21 - ENT - larynx healthy on laryngoscopy. Referred to GI for GI discomfort and constipation.   - 07/04/21 - follow up - had been increasing her exercise up to 50 minutes daily until 2 weeks prior to visit when breathing worsened again, now having dyspnea with minimal exertion. Sent for CT chest, , chest fluoroscopy. CT chest slightly worse but not significantly different, TTE without obvious pathology to cause symptoms. Chest fluoroscopy with elevated left hemidiaphragm and diminished excursion compared to right but not fully paralyzed, without hypoxemia.   - 09/13/20 - follow up - had URI over Christmas and New Year treat with doxycycline and followed with lingering hoarseness, productive cough. No F/C, some DOE, night sweats. Increased MMF.   - 12/29/21 - follow up - very easily dyspneic, even just walking to the car or climbing 12 stairs into house. Less active overall. Not as much coughing, allergies are improved. No heartburn with acid reflux medication.   -  02/16/22 - phone visit - PFTs improved from April, continued pred 10mg  daily, MMF 720 BID, HCQ 200mg  BID.   - 04/20/22 - follow up - some palpitations. Dyspnea is doing alright - some days better than others - less exercise the past 2 weeks as she's been having the palpitations. Initial exercise with dyspnea, sometimes with oxygen dips below 88% so she will stop and rest. 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 Pertinent positives are documented above.  Home Medications: Medications were reviewed and updated in the patient's electronic medical record. Pertinent positives are documented above.   Allergies: Allergies were reviewed and updated in the patient's electronic medical record. Pertinent positives are documented above.  Review of Systems: A comprehensive review of systems was completed and negative except as noted in HPI.    PHYSICAL EXAM:     There were no vitals filed for this visit.  General: pleasant individual appearing stated age, alert and oriented, no acute distress  HEENT: trachea midline, supple  CV: RRR, no m/r/g  Lungs: crackles in BL bases, good air movement throughout   Abd: Soft, NT/ND, no rebound or guarding  Ext: Warm, well perfused, no peripheral edema  Skin: No rashes, skin breakdown, or wounds  Neuro: CN II-XII intact to conversation, alert and oriented.     LABORATORY and RADIOLOGY DATA:     Pulmonary Function Tests/Interpretation:  02/16/22              Pertinent Laboratory Data:  04/25/16 BAL and TBBX   Cultures negative except for OPF     Cell count  PMN 31%  Lymph 11%  Macs 43%  Eos 7%  Baso 6%     Pathology   A: Lung, right lower lobe, endobronchial biopsy   - DIP-like reaction pattern with admixed eosinophils, consistent with eosinophilic pneumonia (see Micro Exam)  - Patchy organizing pneumonia  - Interstitial lymphoplasmacytic infiltrate, polytypic  - Partially denuded benign respiratory mucosa, without viral cytopathic effect  - No malignant neoplasm identified   - GMS and  AFB stains negative for fungi or AFB    Cytology  Lung, Bronchial lavage:  - No malignant cells identified.   - Alveolar macrophages and mixed inflammation.     Pertinent Imaging Data:  08/02/21 HRCT Chest  Slight worsening of fibrotic changes related to  interstitial lung disease associated with connective tissue disease.       Resolution of right lower lobe consolidation with central clearance consistent with organizing pneumonia.     08/26/21 Sniff Test  Elevated left hemidiaphragm with slightly diminished excursion relative to the right but no diaphragmatic paralysis.    Pertinent Cardiac Data:  08/27/21 TTE     1. The left ventricular systolic function is normal, LVEF is visually  estimated at 60-65%.    2. The right ventricle is normal in size, with normal systolic function.    3. The pulmonary systolic pressure cannot be estimated due to insufficient  TR signal.      ASSESSMENT and PLAN     Problem List  Connective Tissue Disease related Interstitial Lung Disease (CTD-ILD)  Cryptogenic Organizing Pneumonia (COP)  SLE   GERD  BMI 37   Left hemidiaphragm elevation    Assessment  PFTs returned to baseline from April to June. Continues on three drug regimen - prednisone, MMF, and HCQ (though unclear benefit for lungs with HCQ) - which we will plan to continue for now. Drop prednisone to 7.5mg  to help with side effects. PFTs again in 6 months. Continue regular exercise and encouraged weight loss.     Plan  - Continue mycophenolate 720mg  BID, HCQ 200mg  BID per rheumatology  - Continue prednisone, drop from 10mg  to 7.5mg  daily    The patient was seen with Dr. Marland Kitchen and will return to clinic in 2 months.    Layla Maw MD  Pulmonary and Critical Care Fellow prednisone to 7.5mg  to help with side effects. PFTs again in 6 months. Continue regular exercise and encouraged weight loss.     For palpitations, dizziness, and dry cough will get ziopatch. Agree with cardiology follow up as planned, she will reach out. No sputum production to culture. Agree with her seeing ENT for sinus cause as significant throat clearing and some sensation of throat irritation. GERD may be contributing as is undertreated based on persistent symptoms.     Plan  - Continue mycophenolate 720mg  BID, HCQ 200mg  BID per rheumatology  - Continue prednisone, drop from 10mg  to 7.5mg  daily  - ZioPatch  - Increase omeprazole to 40mg  BID from daily and see if it improves symptoms.  - ENT follow up     The patient was seen with Dr. Haze Justin and will return to clinic in 3 months.    Layla Maw MD  Pulmonary and Critical Care Fellow

## 2022-04-20 ENCOUNTER — Ambulatory Visit: Admit: 2022-04-20 | Discharge: 2022-04-21 | Payer: MEDICARE

## 2022-04-20 DIAGNOSIS — D849 Immunodeficiency, unspecified: Principal | ICD-10-CM

## 2022-04-20 DIAGNOSIS — R490 Dysphonia: Principal | ICD-10-CM

## 2022-04-20 DIAGNOSIS — J8410 Pulmonary fibrosis, unspecified: Principal | ICD-10-CM

## 2022-04-20 DIAGNOSIS — K219 Gastro-esophageal reflux disease without esophagitis: Principal | ICD-10-CM

## 2022-04-20 DIAGNOSIS — R002 Palpitations: Principal | ICD-10-CM

## 2022-04-20 MED ORDER — OMEPRAZOLE 40 MG CAPSULE,DELAYED RELEASE
ORAL_CAPSULE | Freq: Two times a day (BID) | ORAL | 3 refills | 45 days | Status: CP
Start: 2022-04-20 — End: ?

## 2022-04-20 MED ORDER — PREDNISONE 2.5 MG TABLET
ORAL_TABLET | Freq: Every day | ORAL | 3 refills | 90 days | Status: CP
Start: 2022-04-20 — End: 2023-04-15

## 2022-04-20 NOTE — Unmapped (Signed)
Hi Caitlin Smith,    It was great meeting you today. Here is what we talked about.    1) Interstitial Lung Disease  - Continue mycophenolate 720mg  twice daily  - Continue hydroxychloroquine (HCQ) 200mg  twice daily - more for lupus than lungs, but very important   - Change prednisone dose from 10mg  daily to 7.5mg  daily. Sent new prescription with 2.5mg  tablets instead of 10mg  tablets.    2) Palpitations  - Ordered ziopatch. If you don't hear about this in the next 2 weeks please reach out to cardiology at [phone]  - Agree it would be good to follow up with cardiology. Reach out to them and if you need a new referral I am happy to place.    3) Dry Cough  - Increase omeprazole from 40mg  daily to twice daily   - Agree with ENT follow up     Please return to clinic in 3 months.     If you have any non-urgent questions or concerns please reach out by calling the clinic or via MyChart.    Thanks,  Layla Maw MD  Pulmonary and Critical Care Fellow

## 2022-04-21 ENCOUNTER — Ambulatory Visit: Payer: Commercial Managed Care - HMO | Admitting: Family Medicine

## 2022-04-21 ENCOUNTER — Encounter: Payer: Medicare Other | Admitting: *Deleted

## 2022-04-21 DIAGNOSIS — J849 Interstitial pulmonary disease, unspecified: Secondary | ICD-10-CM

## 2022-04-21 NOTE — Progress Notes (Signed)
Daily Session Note  Patient Details  Name: Nicole Ballard MRN: [id number] Date of Birth: 09-02-1956 Referring Provider:   Flowsheet Row Pulmonary Rehab from 02/17/2022 in [facility] Cardiac and Pulmonary Rehab  Referring Provider Despina Arias MD       Encounter Date: 04/21/2022  Check In:  Session Check In - 04/21/22 0747       Check-In   Supervising physician immediately available to respond to emergencies See telemetry face sheet for immediately available ER MD    Location [facility]-Cardiac & Pulmonary Rehab    Staff Present Heath Lark, RN, BSN, CCRP;Melissa [place], RDN, Tawanna Solo, MS, ASCM CEP, Exercise Physiologist    Virtual Visit No    Medication changes reported     No    Fall or balance concerns reported    No    Warm-up and Cool-down Performed on first and last piece of equipment    Resistance Training Performed Yes    VAD Patient? No    PAD/SET Patient? No      Pain Assessment   Currently in Pain? No/denies                Social History   Tobacco Use  Smoking Status Never  Smokeless Tobacco Never    Goals Met:  Proper associated with RPD/PD & O2 Sat Independence with exercise equipment Exercise tolerated well No report of concerns or symptoms today  Goals Unmet:  Not Applicable  Comments: Pt able to follow exercise prescription today without complaint.  Will continue to monitor for progression.    Dr. Emily Filbert is Medical Director for [place].  Dr. Ottie Glazier is Medical Director for [facility] Pulmonary Rehabilitation.

## 2022-04-26 ENCOUNTER — Encounter: Payer: Medicare Other | Admitting: *Deleted

## 2022-04-26 DIAGNOSIS — J849 Interstitial pulmonary disease, unspecified: Secondary | ICD-10-CM

## 2022-04-26 NOTE — Progress Notes (Signed)
Daily Session Note  Patient Details  Name: Nicole Ballard MRN: [id number] Date of Birth: April 03, 1956 Referring Provider:   Flowsheet Row Pulmonary Rehab from 02/17/2022 in [facility] Cardiac and Pulmonary Rehab  Referring Provider Despina Arias MD       Encounter Date: 04/26/2022  Check In:  Session Check In - 04/26/22 0817       Check-In   Supervising physician immediately available to respond to emergencies See telemetry face sheet for immediately available ER MD    Location [facility]-Cardiac & Pulmonary Rehab    Staff Present Heath Lark, RN, BSN, CCRP;Jessica [place], MA, RCEP, CCRP, [place], BS, ACSM CEP, Exercise Physiologist    Virtual Visit No    Medication changes reported     No    Fall or balance concerns reported    No    Warm-up and Cool-down Performed on first and last piece of equipment    Resistance Training Performed Yes    VAD Patient? No    PAD/SET Patient? No      Pain Assessment   Currently in Pain? No/denies                Social History   Tobacco Use  Smoking Status Never  Smokeless Tobacco Never    Goals Met:  Proper associated with RPD/PD & O2 Sat Independence with exercise equipment Exercise tolerated well No report of concerns or symptoms today  Goals Unmet:  Not Applicable  Comments: Pt able to follow exercise prescription today without complaint.  Will continue to monitor for progression.    Dr. Emily Filbert is Medical Director for [place].  Dr. Ottie Glazier is Medical Director for [facility] Pulmonary Rehabilitation.

## 2022-04-28 ENCOUNTER — Encounter: Payer: Medicare Other | Admitting: *Deleted

## 2022-04-28 ENCOUNTER — Telehealth
Admit: 2022-04-28 | Discharge: 2022-04-29 | Payer: MEDICARE | Attending: Student in an Organized Health Care Education/Training Program | Primary: Student in an Organized Health Care Education/Training Program

## 2022-04-28 DIAGNOSIS — J849 Interstitial pulmonary disease, unspecified: Secondary | ICD-10-CM | POA: Diagnosis not present

## 2022-04-28 DIAGNOSIS — R002 Palpitations: Principal | ICD-10-CM

## 2022-04-28 NOTE — Progress Notes (Signed)
Daily Session Note  Patient Details  Name: Nicole Ballard MRN: [id number] Date of Birth: [birth year] Referring Provider:   Flowsheet Row Pulmonary Rehab from 02/17/2022 in [facility] Cardiac and Pulmonary Rehab  Referring Provider Despina Arias MD       Encounter Date: 04/28/2022  Check In:  Session Check In - 04/28/22 0916       Check-In   Supervising physician immediately available to respond to emergencies See telemetry face sheet for immediately available ER MD    Location [facility]-Cardiac & Pulmonary Rehab    Staff Present Heath Lark, RN, BSN, CCRP;Melissa [place], RDN, LDN;Joseph Albers, RCP,RRT,BSRT;Noah Tickle, BS, Exercise Physiologist    Virtual Visit No    Medication changes reported     No    Fall or balance concerns reported    No    Warm-up and Cool-down Performed on first and last piece of equipment    Resistance Training Performed Yes    VAD Patient? No    PAD/SET Patient? No      Pain Assessment   Currently in Pain? No/denies                Social History   Tobacco Use  Smoking Status Never  Smokeless Tobacco Never    Goals Met:  Proper associated with RPD/PD & O2 Sat Independence with exercise equipment Exercise tolerated well No report of concerns or symptoms today  Goals Unmet:  Not Applicable  Comments: Pt able to follow exercise prescription today without complaint.  Will continue to monitor for progression.    Dr. Emily Filbert is Medical Director for [place].  Dr. Ottie Glazier is Medical Director for [facility] Pulmonary Rehabilitation.

## 2022-04-28 NOTE — Unmapped (Signed)
[facility]  Psychiatry   Established Patient E&M Service - Outpatient       Assessment:    Caitlin Smith presents for follow-up evaluation. Reviewed medications with patient today. Overall mood has been elevated. She reports some anxiety. Discussed importance of engaging with therapy. Increased Kloponin medication. Follow up in October.     [ ]  Stop Effexor   [ ]  Continue Wellbutrin and Lamictal for now   [ ]  Call clinic for help     Identifying Information:    Caitlin Smith is a 66 y.o. female with a history of  MDD, GAD, and Panic Disorder in the context of many chronic medical conditions including chronic pain/fibromyalgia, osteoarthritis with degenerative disc disease, Lupus, pituitary tumor (benign), s/p right total knee arthroplasty, and VIN II (s/p resection), who presents for follow-up evaluation. She has been maintained on Lamictal for many years as well as cymbalta, trazodone, klonopin, and wellbutrin. Patient has previously tolerated a slight taper in her klonopin from 0.75 mg to 0.25 mg qHS overtime. Her mood is often exacerbated by steroid use for current treatment of pulmonary disease. Patient is engaged with CBT therapy at the pain clinic.    Past medications:  Cymbalta    Risk Assessment:  An assessment of suicide and violence risk factors was performed as part of this evaluation and is not significantly changed from the last visit. While future psychiatric events cannot be accurately predicted, the patient does not currently require acute inpatient psychiatric care and does not currently meet [facility] involuntary commitment criteria.      Plan:    Problem: Major depression, Anxiety NOS, Panic Disorder   Status of problem: chronic with mild exacerbation  Interventions:   -- Continue Lamictal 400mg  qhs for mood  -- Stop Effexor 100 mg daily   -- Increase Wellbutrin XL 300 qAM   -- Continue Clonazepam 0.25mg  at bedtime  -- Continue CBT at pain clinic    Problem: Insomnia   Status of problem: chronic and stable  Interventions:   Last sleep study performed in 2017. Per chart review, showed mild OSA likely related to medication use. Neurology recommends patient avoid sleeping on back as she likely has OSA when sleeping in this position  -- Continue melatonin 3-5mg  at bedtime   -- Continue trazodone 100mg  qHS for insomnia (d5/4/21, d 02/05/20, i9/8,)  -- Clonazepam per above    Problem: High Risk Medication monitoring   Status of problem:  Stable   Interventions:   The complexity of this patient's care involves drug therapy which requires intensive monitoring for toxicity. This is in part due to a narrow therapeutic window, as well as potential for toxicity. This patient is being treated with Lamotrigine (Lamictal) to target their psychiatric disorder, Major depressive disorder, refractory. In regards to this medication being considered high risk, Lamotrigine (lamictal) has black box warning for serious rash including fatal reaction of Stevens-Johnson syndrome and rare cases of toxic epidermal necrolysis. In addition to prolonged titration to mitigate risk of serious rash, lamotrigine requires monitoring of hepatic and renal function at baseline, and at times will require monitoring of lamotrigine blood levels.    Problem: Mild Major Neurocognitive Disorder,   Status of problem:  new problem to this provider  Interventions:  Patient seen by Neurology ([facility]) 05/22/18 who dx pt with mild neurocognitive impairment w/ deficits in inattention and visual-spatial function with suspicion that sleep apnea and polypharmacy may be contributing to her difficulties. Recommended that  psychopharm regimen be simplified, including eliminating trazodone and considering changing Wellbutrin to Zoloft or Lexapro for anxiety and eliminating Klonopin when stable.   - However, it is very likely that patient's lupus is significantly contributing to cognitive decline, which will limit improvement she will see with reduction in polypharmacy  -- Last MOCA 22 on 12/12/2017  -- Need to repeat MOCA.     Problem: Polypharmacy   Status of problem:  chronic and stable  Interventions:  -- may be contributing to neurocognitive disorder   -- will consider trazodone and klonopin taper     Lamotrigine Monitoring  - Hepatic function testing, platelets (via CBC) q6 months  - AST/ALT WNL     Effexor   --Impaired renal function can decrease clearance of effexor  -- most recent CrCl 89   -- will continue to monitor along with PCP given dx lupus    Psychotherapy provided:  No billable psychotherapy service provided but brief supportive therapy was utilized.    Patient has been given this writer's contact information as well as the [facility] Psychiatry urgent line number. The patient has been instructed to call 911 for emergencies.    Subjective:    Interval History:       Patient reported feeling better with current medication adjustment. Patient requested to keep with MD Virl Cagey as she comfortable with her and appreciated the consistent in care.   Mood: Better  Sleep Pretty good, throughout the night.   Medications:   Lamictal 400mg  at bedtime ---she feels it helpful   Effexor 100mg  daily ---she feels it helpful  Wellbutrin 150mg  daily--tolerating    Clonazepam .25mg  at bedtime.   Trazadone 100mg  at bedtime   Melatonin prn   Safety Concerns/SI/SIB: History of SI---denies SI recently. Motivated to live a good life. If that happens again she will reach out to the clinic   Collateral: No collateral        Objective:    Mental Status Exam:    Appearance:    Appears stated age, Well nourished and Clean/Neat   Motor:    tremulous, appropriate eye contact    Speech/Language:    Normal rate, volume, tone, fluency and Language intact, well formed   Mood:   Anxious   Affect:   Anxious, Cooperative and Full   Thought process and Associations:   Logical, linear, clear, coherent, goal directed   Abnormal/psychotic thought content:     Denies SI, HI, self harm, delusions, obsessions, paranoid ideation, or ideas of reference   Perceptual disturbances:     Denies auditory and visual hallucinations, behavior not concerning for response to internal stimuli     Other:   insight/judgement intact      LAMICTAL MONITORING: Hepatic function testing, platelets (via CBC) at no defined frequency, but should do at baseline and can do periodically  Lamictal Level: No results found for: LAMOTRIGINE     LFTs:   Lab Results   Component Value Date    AST 21 09/30/2021    AST 11 04/29/2021    AST 28 12/29/2020    AST 24 04/08/2019    AST 32 11/05/2014    AST 30 05/08/2014      Lab Results   Component Value Date    ALT 14 02/08/2022    ALT 12 09/30/2021    ALT 14 04/29/2021    ALT 13 04/08/2019    ALT 21 11/05/2014    ALT 31 05/08/2014     Platelets:   Lab  Results   Component Value Date    Platelet 338 02/08/2022    Platelet 343 09/30/2021    Platelet 306 08/11/2021    Platelet 349 04/08/2019    Platelet 304 11/05/2014    Platelet 347 05/08/2014     Lab Results   Component Value Date    WBC 12.1 (H) 02/08/2022    HGB 13.4 02/08/2022    HCT 42.2 02/08/2022    PLT 338 02/08/2022     Patient was seen and plan of care was discussed with the Attending MD, Evette Cristal who agrees with the above statement and plan.     Portions of this record have been created using Scientist, clinical (histocompatibility and immunogenetics). Dictation errors have been sought, but may not have been identified and corrected.     Ovid Curd   PGY-2 Psychiatry          The patient reports they are currently: at home. I spent 35 minutes on the real-time audio and video with the patient on the date of service. I spent an additional 10 minutes on pre- and post-visit activities on the date of service.     The patient was physically located in [place] or a state in which I am permitted to provide care. The patient and/or parent/guardian understood that s/he may incur co-pays and cost sharing, and agreed to the telemedicine visit. The visit was reasonable and appropriate under the circumstances given the patient's presentation at the time.    The patient and/or parent/guardian has been advised of the potential risks and limitations of this mode of treatment (including, but not limited to, the absence of in-person examination) and has agreed to be treated using telemedicine. The patient's/patient's family's questions regarding telemedicine have been answered.     If the visit was completed in an ambulatory setting, the patient and/or parent/guardian has also been advised to contact their provider???s office for worsening conditions, and seek emergency medical treatment and/or call 911 if the patient deems either necessary.

## 2022-05-03 ENCOUNTER — Encounter: Payer: Medicare Other | Admitting: *Deleted

## 2022-05-03 DIAGNOSIS — J849 Interstitial pulmonary disease, unspecified: Secondary | ICD-10-CM

## 2022-05-03 NOTE — Progress Notes (Signed)
Daily Session Note  Patient Details  Name: TACIA HINDLEY MRN: [id number] Date of Birth: Oct 11, 1955 Referring Provider:   Flowsheet Row Pulmonary Rehab from 02/17/2022 in [facility] Cardiac and Pulmonary Rehab  Referring Provider Despina Arias MD       Encounter Date: 05/03/2022  Check In:  Session Check In - 05/03/22 0748       Check-In   Supervising physician immediately available to respond to emergencies See telemetry face sheet for immediately available ER MD    Location [facility]-Cardiac & Pulmonary Rehab    Staff Present Nyoka Cowden, RN, BSN, [place], BS, ACSM CEP, Exercise Physiologist;Jessica [place], MA, RCEP, CCRP, CCET    Virtual Visit No    Medication changes reported     No    Fall or balance concerns reported    No    Tobacco Cessation No Change    Warm-up and Cool-down Performed on first and last piece of equipment    Resistance Training Performed Yes    VAD Patient? No    PAD/SET Patient? No      Pain Assessment   Currently in Pain? No/denies                Social History   Tobacco Use  Smoking Status Never  Smokeless Tobacco Never    Goals Met:  Independence with exercise equipment Exercise tolerated well No report of concerns or symptoms today  Goals Unmet:  Not Applicable  Comments: Pt able to follow exercise prescription today without complaint.  Will continue to monitor for progression.    Dr. Emily Filbert is Medical Director for [place].  Dr. Ottie Glazier is Medical Director for [facility] Pulmonary Rehabilitation.

## 2022-05-05 ENCOUNTER — Encounter: Payer: Self-pay | Admitting: Family Medicine

## 2022-05-05 ENCOUNTER — Encounter: Payer: Medicare Other | Admitting: *Deleted

## 2022-05-05 DIAGNOSIS — J849 Interstitial pulmonary disease, unspecified: Secondary | ICD-10-CM

## 2022-05-05 NOTE — Progress Notes (Signed)
Daily Session Note  Patient Details  Name: Nicole Ballard MRN: [id number] Date of Birth: 12-Oct-1955 Referring Provider:   Flowsheet Row Pulmonary Rehab from 02/17/2022 in [facility] Cardiac and Pulmonary Rehab  Referring Provider Despina Arias MD       Encounter Date: 05/05/2022  Check In:  Session Check In - 05/05/22 0845       Check-In   Supervising physician immediately available to respond to emergencies See telemetry face sheet for immediately available ER MD    Location [facility]-Cardiac & Pulmonary Rehab    Staff Present Nyoka Cowden, RN, BSN, Lauretta Grill, RCP,RRT,BSRT;Melissa [place], [place], LDN    Virtual Visit No    Medication changes reported     No    Fall or balance concerns reported    No    Tobacco Cessation No Change    Warm-up and Cool-down Performed on first and last piece of equipment    Resistance Training Performed No    VAD Patient? No    PAD/SET Patient? No      Pain Assessment   Currently in Pain? No/denies                Social History   Tobacco Use  Smoking Status Never  Smokeless Tobacco Never    Goals Met:  Independence with exercise equipment Exercise tolerated well No report of concerns or symptoms today  Goals Unmet:  Not Applicable  Comments: Pt able to follow exercise prescription today without complaint.  Will continue to monitor for progression.    Dr. Emily Filbert is Medical Director for [place].  Dr. Ottie Glazier is Medical Director for [facility] Pulmonary Rehabilitation.

## 2022-05-10 ENCOUNTER — Encounter: Payer: Medicare Other | Attending: Critical Care Medicine | Admitting: *Deleted

## 2022-05-10 DIAGNOSIS — J849 Interstitial pulmonary disease, unspecified: Secondary | ICD-10-CM | POA: Diagnosis present

## 2022-05-10 NOTE — Progress Notes (Signed)
Daily Session Note  Patient Details  Name: Nicole Ballard MRN: [id number] Date of Birth: 07/28/1956 Referring Provider:   Flowsheet Row Pulmonary Rehab from 02/17/2022 in [facility] Cardiac and Pulmonary Rehab  Referring Provider Despina Arias MD       Encounter Date: 05/10/2022  Check In:  Session Check In - 05/10/22 0801       Check-In   Supervising physician immediately available to respond to emergencies See telemetry face sheet for immediately available ER MD    Location [facility]-Cardiac & Pulmonary Rehab    Staff Present Nyoka Cowden, RN, BSN, [place], MA, RCEP, CCRP, [place], MS, ASCM CEP, Exercise Physiologist    Virtual Visit No    Medication changes reported     No    Fall or balance concerns reported    No    Tobacco Cessation No Change    Warm-up and Cool-down Performed on first and last piece of equipment    Resistance Training Performed Yes    VAD Patient? No    PAD/SET Patient? No      Pain Assessment   Currently in Pain? No/denies                Social History   Tobacco Use  Smoking Status Never  Smokeless Tobacco Never    Goals Met:  Independence with exercise equipment Exercise tolerated well No report of concerns or symptoms today  Goals Unmet:  Not Applicable  Comments: Pt able to follow exercise prescription today without complaint.  Will continue to monitor for progression.    Dr. Emily Filbert is Medical Director for [place].  Dr. Ottie Glazier is Medical Director for [facility] Pulmonary Rehabilitation.

## 2022-05-11 DIAGNOSIS — J849 Interstitial pulmonary disease, unspecified: Secondary | ICD-10-CM

## 2022-05-11 NOTE — Progress Notes (Signed)
30 Day review completed. Medical Director ITP review done, changes made as directed, and signed approval by Medical Director.  

## 2022-05-11 NOTE — Progress Notes (Signed)
Pulmonary Individual Treatment Plan  Patient Details  Name: BARNEY GERTSCH MRN: [id number] Date of Birth: 04/30/1956 Referring Provider:   Flowsheet Row Pulmonary Rehab from 02/17/2022 in [facility] Cardiac and Pulmonary Rehab  Referring Provider Despina Arias MD       Initial Encounter Date:  Flowsheet Row Pulmonary Rehab from 02/17/2022 in [facility] Cardiac and Pulmonary Rehab  Date 02/17/22       Visit Diagnosis: ILD (interstitial lung disease) ([place])  Patient's Home Medications on Admission:  Current Outpatient Medications:    albuterol (VENTOLIN HFA) 108 (90 Base) MCG/ACT inhaler, SMARTSIG:2 Puff(s) By Mouth Every 6 Hours PRN, Disp: , Rfl:    amLODipine (NORVASC) 2.5 MG tablet, TAKE 3 TABLETS BY MOUTH DAILY, Disp: 270 tablet, Rfl: 3   aspirin EC 81 MG tablet, Take 1 tablet (81 mg total) by mouth daily., Disp: 30 tablet, Rfl: 0   atorvastatin (LIPITOR) 40 MG tablet, TAKE 1 TABLET BY MOUTH  DAILY, Disp: 90 tablet, Rfl: 3   Biotin 1 MG CAPS, Take 1 capsule by mouth daily., Disp: 30 capsule, Rfl: 0   buPROPion (WELLBUTRIN XL) 300 MG 24 hr tablet, Take 300 mg by mouth in the morning., Disp: , Rfl:    Cholecalciferol (VITAMIN D) 2000 UNITS CAPS, Take 1 capsule by mouth daily., Disp: , Rfl:    clobetasol ointment (TEMOVATE) 0.10 %, Apply 1 application topically 2 (two) times daily., Disp: , Rfl:    clonazePAM (KLONOPIN) 0.5 MG tablet, Take 0.25 mg by mouth at bedtime., Disp: , Rfl:    clotrimazole-betamethasone (LOTRISONE) cream, Apply 1 application topically 2 (two) times daily., Disp: 45 g, Rfl: 0   diclofenac Sodium (VOLTAREN) 1 % GEL, Apply topically daily as needed., Disp: , Rfl:    famotidine (PEPCID) 20 MG tablet, Take by mouth., Disp: , Rfl:    fluticasone (FLONASE) 50 MCG/ACT nasal spray, Place 2 sprays into both nostrils daily., Disp: 48 g, Rfl: 3   fluticasone (FLOVENT HFA) 220 MCG/ACT inhaler, Inhale into the lungs., Disp: , Rfl:    gabapentin (NEURONTIN) 300 MG capsule, Take  300 mg (1 capsule) in the morning and at 300 mg (1 capsule) at noon and 600 mg (2 capsules) at night., Disp: , Rfl:    hydrALAZINE (APRESOLINE) 10 MG tablet, TAKE 1 TABLET BY MOUTH THREE TIMES DAILY AS NEEDED FOR BLOOD PRESSURE ABOVE 150/90, Disp: 90 tablet, Rfl: 2   hydroxychloroquine (PLAQUENIL) 200 MG tablet, Take by mouth 2 (two) times daily., Disp: , Rfl:    hydrOXYzine (ATARAX) 10 MG tablet, Take 1 tablet (10 mg total) by mouth 3 (three) times daily as needed., Disp: 30 tablet, Rfl: 0   lamoTRIgine (LAMICTAL) 200 MG tablet, Take 400 mg by mouth at bedtime., Disp: , Rfl:    LINZESS 145 MCG CAPS capsule, Take 145 mcg by mouth daily., Disp: , Rfl:    loratadine (CLARITIN) 10 MG tablet, Take 1 tablet (10 mg total) by mouth daily., Disp: 90 tablet, Rfl: 1   Melatonin 10 MG TABS, Take 1 tablet by mouth at bedtime., Disp: , Rfl:    metFORMIN (GLUCOPHAGE-XR) 500 MG 24 hr tablet, Take 1 tablet (500 mg total) by mouth daily with breakfast., Disp: 90 tablet, Rfl: 1   metoprolol succinate (TOPROL-XL) 25 MG 24 hr tablet, Take 1 tablet (25 mg total) by mouth daily. In place of Atenolol, Disp: 30 tablet, Rfl: 0   mycophenolate (MYFORTIC) 360 MG TBEC EC tablet, Take 720 mg by mouth 2 (two) times daily., Disp: ,  Rfl:    olopatadine (PATANOL) 0.1 % ophthalmic solution, Place 1 drop into both eyes 2 (two) times daily., Disp: 5 mL, Rfl: 1   omeprazole (PRILOSEC) 40 MG capsule, TAKE 1 CAPSULE BY MOUTH  DAILY, Disp: 90 capsule, Rfl: 3   Polyethylene Glycol 3350 (MIRALAX PO), Take by mouth., Disp: , Rfl:    predniSONE (DELTASONE) 10 MG tablet, Take by mouth., Disp: , Rfl:    telmisartan (MICARDIS) 80 MG tablet, TAKE 1 TABLET BY MOUTH DAILY, Disp: 90 tablet, Rfl: 3   traZODone (DESYREL) 100 MG tablet, Take 150 mg by mouth at bedtime., Disp: , Rfl:    triamcinolone ointment (KENALOG) 0.1 %, , Disp: , Rfl:    venlafaxine (EFFEXOR) 100 MG tablet, Take 100 mg by mouth every evening., Disp: , Rfl:   Past Medical  History: Past Medical History:  Diagnosis Date   Anxiety    Bipolar 1 disorder ([place])    Chronic kidney disease    Chronic pain    Diabetes mellitus without complication (HCC)    Fibromyalgia    GERD (gastroesophageal reflux disease)    Hearing loss of right ear    Hemifacial spasm    Hyperlipidemia    Hypertension    Insomnia    Lumbago    Osteoarthritis    Paresthesia    Rhinitis    Systemic lupus erythematosus ([place])    Vitamin D deficiency     Tobacco Use: Social History   Tobacco Use  Smoking Status Never  Smokeless Tobacco Never    Labs: Review Flowsheet  More data exists      Latest Ref Rng & Units 10/09/2019 02/14/2020 06/15/2020 12/14/2020 06/15/2021  Labs for ITP Cardiac and Pulmonary Rehab  Cholestrol <200 mg/dL - - 151  - 154   LDL (calc) mg/dL (calc) - - 74  - 75   HDL-C > OR = 50 mg/dL - - 61  - 62   Trlycerides <150 mg/dL - - 78  - 89   Hemoglobin A1c <5.7 % of total Hgb 5.5  5.6  5.5  5.7  5.5      Pulmonary Assessment Scores:  Pulmonary Assessment Scores     Row Name 02/17/22 1115         ADL UCSD   ADL Phase Entry     SOB Score total 43     Rest 1     Walk 2     Stairs 3     Bath 1     Dress 3     Shop 2       CAT Score   CAT Score 26       mMRC Score   mMRC Score 2              UCSD: Self-administered rating of dyspnea associated with activities of daily living (ADLs) 6-point scale (0 = "not at all" to 5 = "maximal or unable to do because of breathlessness")  Scoring Scores range from 0 to 120.  Minimally important difference is 5 units  CAT: CAT can identify the health impairment of COPD patients and is better correlated with disease progression.  CAT has a scoring range of zero to 40. The CAT score is classified into four groups of low (less than 10), medium (10 - 20), high (21-30) and very high (31-40) based on the impact level of disease on health status. A CAT score over 10 suggests significant symptoms.  A worsening  CAT score could  be explained by an exacerbation, poor medication adherence, poor inhaler technique, or progression of COPD or comorbid conditions.  CAT MCID is 2 points  mMRC: mMRC (Modified Medical Research Council) Dyspnea Scale is used to assess the degree of baseline functional disability in patients of respiratory disease due to dyspnea. No minimal important difference is established. A decrease in score of 1 point or greater is considered a positive change.   Pulmonary Function Assessment:  Pulmonary Function Assessment - 02/17/22 1115       Breath   Shortness of Breath Yes;Panic with Shortness of Breath;Fear of Shortness of Breath;Limiting activity             Exercise Target Goals: Exercise Program Goal: Individual exercise prescription set using results from initial 6 min walk test and THRR while considering  patient's activity barriers and safety.   Exercise Prescription Goal: Initial exercise prescription builds to 30-45 minutes a day of aerobic activity, 2-3 days per week.  Home exercise guidelines will be given to patient during program as part of exercise prescription that the participant will acknowledge.  Education: Aerobic Exercise: - Group verbal and visual presentation on the components of exercise prescription. Introduces F.I.T.T principle from ACSM for exercise prescriptions.  Reviews F.I.T.T. principles of aerobic exercise including progression. Written material given at graduation. Flowsheet Row Pulmonary Rehab from 04/21/2022 in [facility] Cardiac and Pulmonary Rehab  Date 03/10/22  Educator [facility]  Instruction Review Code 1- Verbalizes Understanding       Education: Resistance Exercise: - Group verbal and visual presentation on the components of exercise prescription. Introduces F.I.T.T principle from ACSM for exercise prescriptions  Reviews F.I.T.T. principles of resistance exercise including progression. Written material given at graduation.    Education:  Exercise & Equipment Safety: - Individual verbal instruction and demonstration of equipment use and safety with use of the equipment. Flowsheet Row Pulmonary Rehab from 04/21/2022 in [facility] Cardiac and Pulmonary Rehab  Date 02/17/22  Educator [facility]  Instruction Review Code 1- Verbalizes Understanding       Education: Exercise Physiology & General Exercise Guidelines: - Group verbal and written instruction with models to review the exercise physiology of the cardiovascular system and associated critical values. Provides general exercise guidelines with specific guidelines to those with heart or lung disease.  Flowsheet Row Pulmonary Rehab from 04/21/2022 in [facility] Cardiac and Pulmonary Rehab  Date 03/03/22  Educator NT  Instruction Review Code 1- [employer] Understanding       Education: Flexibility, Balance, Mind/Body Relaxation: - Group verbal and visual presentation with interactive activity on the components of exercise prescription. Introduces F.I.T.T principle from ACSM for exercise prescriptions. Reviews F.I.T.T. principles of flexibility and balance exercise training including progression. Also discusses the mind body connection.  Reviews various relaxation techniques to help reduce and manage stress (i.e. Deep breathing, progressive muscle relaxation, and visualization). Balance handout provided to take home. Written material given at graduation.   Activity Barriers & Risk Stratification:  Activity Barriers & Cardiac Risk Stratification - 02/17/22 1048       Activity Barriers & Cardiac Risk Stratification   Activity Barriers Right Knee Replacement;Muscular Weakness;Deconditioning;Balance Concerns;Shortness of Breath             6 Minute Walk:  6 Minute Walk     Row Name 02/17/22 1045         6 Minute Walk   Phase Initial     Distance 1320 feet     Walk Time 6 minutes     #  of Rest Breaks 0     MPH 2.5     METS 2.72     RPE 11     Perceived Dyspnea  1     VO2 Peak  9.52     Symptoms Yes (comment)     Comments SOB, dizzy on turns     Resting HR 68 bpm     Resting BP 136/64     Resting Oxygen Saturation  96 %     Exercise Oxygen Saturation  during 6 min walk 85 %     Max Ex. HR 103 bpm     Max Ex. BP 134/72     2 Minute Post BP 132/62       Interval HR   1 Minute HR 86     2 Minute HR 95     3 Minute HR 103     4 Minute HR 102     5 Minute HR 102     6 Minute HR 103     2 Minute Post HR 73     Interval Heart Rate? Yes       Interval Oxygen   Interval Oxygen? Yes     Baseline Oxygen Saturation % 96 %     1 Minute Oxygen Saturation % 89 %     1 Minute Liters of Oxygen 0 L  Room Air     2 Minute Oxygen Saturation % 87 %     2 Minute Liters of Oxygen 0 L     3 Minute Oxygen Saturation % 86 %     3 Minute Liters of Oxygen 0 L     4 Minute Oxygen Saturation % 85 %     4 Minute Liters of Oxygen 0 L     5 Minute Oxygen Saturation % 87 %     5 Minute Liters of Oxygen 0 L     6 Minute Oxygen Saturation % 86 %     6 Minute Liters of Oxygen 0 L     2 Minute Post Oxygen Saturation % 95 %     2 Minute Post Liters of Oxygen 0 L             Oxygen Initial Assessment:  Oxygen Initial Assessment - 02/17/22 1115       Home Oxygen   Home Oxygen Device None    Sleep Oxygen Prescription None    Home Exercise Oxygen Prescription None    Home Resting Oxygen Prescription None    Compliance with Home Oxygen Use Yes      Initial 6 min Walk   Oxygen Used None      Program Oxygen Prescription   Program Oxygen Prescription None      Intervention   Short Term Goals To learn and demonstrate proper pursed lip breathing techniques or other breathing techniques. ;To learn and demonstrate proper use of respiratory medications;To learn and understand importance of maintaining oxygen saturations>88%;To learn and understand importance of monitoring SPO2 with pulse oximeter and demonstrate accurate use of the pulse oximeter.    Long  Term Goals Exhibits  proper breathing techniques, such as pursed lip breathing or other method taught during program session;Demonstrates proper use of MDI's;Compliance with respiratory medication;Maintenance of O2 saturations>88%;Verbalizes importance of monitoring SPO2 with pulse oximeter and return demonstration             Oxygen Re-Evaluation:  Oxygen Re-Evaluation     Row Name 02/22/22 [phone] 03/15/22 0826 03/29/22 0818  04/19/22 0830       Program Oxygen Prescription   Program Oxygen Prescription None None None None      Home Oxygen   Home Oxygen Device None None None None    Sleep Oxygen Prescription None None None None    Home Exercise Oxygen Prescription None None None None    Home Resting Oxygen Prescription None None None None    Compliance with Home Oxygen Use Yes Yes Yes Yes      Goals/Expected Outcomes   Short Term Goals To learn and demonstrate proper pursed lip breathing techniques or other breathing techniques. ;To learn and understand importance of maintaining oxygen saturations>88%;To learn and understand importance of monitoring SPO2 with pulse oximeter and demonstrate accurate use of the pulse oximeter. To learn and demonstrate proper pursed lip breathing techniques or other breathing techniques. ;To learn and understand importance of maintaining oxygen saturations>88%;To learn and understand importance of monitoring SPO2 with pulse oximeter and demonstrate accurate use of the pulse oximeter. To learn and demonstrate proper pursed lip breathing techniques or other breathing techniques. ;To learn and understand importance of maintaining oxygen saturations>88%;To learn and understand importance of monitoring SPO2 with pulse oximeter and demonstrate accurate use of the pulse oximeter. To learn and demonstrate proper pursed lip breathing techniques or other breathing techniques. ;To learn and understand importance of maintaining oxygen saturations>88%;To learn and understand importance of  monitoring SPO2 with pulse oximeter and demonstrate accurate use of the pulse oximeter.    Long  Term Goals Exhibits proper breathing techniques, such as pursed lip breathing or other method taught during program session;Compliance with respiratory medication;Maintenance of O2 saturations>88%;Verbalizes importance of monitoring SPO2 with pulse oximeter and return demonstration Exhibits proper breathing techniques, such as pursed lip breathing or other method taught during program session;Compliance with respiratory medication;Maintenance of O2 saturations>88%;Verbalizes importance of monitoring SPO2 with pulse oximeter and return demonstration Exhibits proper breathing techniques, such as pursed lip breathing or other method taught during program session;Compliance with respiratory medication;Maintenance of O2 saturations>88%;Verbalizes importance of monitoring SPO2 with pulse oximeter and return demonstration Exhibits proper breathing techniques, such as pursed lip breathing or other method taught during program session;Compliance with respiratory medication;Maintenance of O2 saturations>88%;Verbalizes importance of monitoring SPO2 with pulse oximeter and return demonstration    Comments Reviewed PLB technique with pt.  Talked about how it works and it's importance in maintaining their exercise saturations. Laron is doing well in rehab. She is getting better at PLB and using it more often. She is good about keeping an eye on her breathing at home.  She has ordered a pulse oximeter for home use. Patient reports that she is doing better wither her SOB and using PLB at home to help control this. She stated she does not have pulse ox at home but she plans to order one. Sheyna is doing better with her SOB.  She is good about using her PLB to manage her breathing.  She is pleased with how much her breathing has improved.  She had some PVCs over weekend that triggered anixety and SOB and dizziness, but breathing  helped.  Today, she was feeling better.    Goals/Expected Outcomes Short: Become more profiecient at using PLB.   Long: Become independent at using PLB. Short; Use pulse oximeter at home Long: Conitnue to improve PLB Short; Use pulse oximeter at home Long: Conitnue to improve PLB Short: Continue to use PLB Long: Conitnue to monitor saturations at home  Oxygen Discharge (Final Oxygen Re-Evaluation):  Oxygen Re-Evaluation - 04/19/22 0830       Program Oxygen Prescription   Program Oxygen Prescription None      Home Oxygen   Home Oxygen Device None    Sleep Oxygen Prescription None    Home Exercise Oxygen Prescription None    Home Resting Oxygen Prescription None    Compliance with Home Oxygen Use Yes      Goals/Expected Outcomes   Short Term Goals To learn and demonstrate proper pursed lip breathing techniques or other breathing techniques. ;To learn and understand importance of maintaining oxygen saturations>88%;To learn and understand importance of monitoring SPO2 with pulse oximeter and demonstrate accurate use of the pulse oximeter.    Long  Term Goals Exhibits proper breathing techniques, such as pursed lip breathing or other method taught during program session;Compliance with respiratory medication;Maintenance of O2 saturations>88%;Verbalizes importance of monitoring SPO2 with pulse oximeter and return demonstration    Comments Emelda is doing better with her SOB.  She is good about using her PLB to manage her breathing.  She is pleased with how much her breathing has improved.  She had some PVCs over weekend that triggered anixety and SOB and dizziness, but breathing helped.  Today, she was feeling better.    Goals/Expected Outcomes Short: Continue to use PLB Long: Conitnue to monitor saturations at home             Initial Exercise Prescription:  Initial Exercise Prescription - 02/17/22 1000       Date of Initial Exercise RX and Referring Provider   Date  02/17/22    Referring Provider Despina Arias MD      Oxygen   Maintain Oxygen Saturation 88% or higher      Treadmill   MPH 2.2    Grade 1    Minutes 15    METs 2.99      Recumbant Bike   Level 3    RPM 50    Watts 25    Minutes 15    METs 3      NuStep   Level 3    SPM 80    Minutes 15    METs 3      Track   Laps 35    Minutes 15    METs 2.9      Prescription Details   Frequency (times per week) 2    Duration Progress to 30 minutes of continuous aerobic without signs/symptoms of physical distress      Intensity   THRR 40-80% of Max Heartrate 103-138    Ratings of Perceived Exertion 11-13    Perceived Dyspnea 0-4      Progression   Progression Continue to progress workloads to maintain intensity without signs/symptoms of physical distress.      Resistance Training   Training Prescription Yes    Weight 3 lb    Reps 10-15             Perform Capillary Blood Glucose checks as needed.  Exercise Prescription Changes:   Exercise Prescription Changes     Row Name 02/17/22 1000 03/01/22 0700 03/15/22 0800 03/28/22 1400 04/11/22 1500     Response to Exercise   Blood Pressure (Admit) 136/64 132/82 112/60 126/64 116/64   Blood Pressure (Exercise) 134/72 142/72 132/70 122/68 --   Blood Pressure (Exit) 122/62 128/72 126/64 104/70 106/68   Heart Rate (Admit) 68 bpm 83 bpm 71 bpm 86 bpm 75 bpm  Heart Rate (Exercise) 103 bpm 94 bpm 87 bpm 88 bpm 97 bpm   Heart Rate (Exit) 75 bpm 79 bpm 85 bpm 77 bpm 82 bpm   Oxygen Saturation (Admit) 96 % 94 % 95 % 92 % 95 %   Oxygen Saturation (Exercise) 85 % 91 % 92 % 91 % 88 %   Oxygen Saturation (Exit) 95 % 93 % 90 % 96 % 92 %   Rating of Perceived Exertion (Exercise) _0 Perceived Dyspnea (Exercise) _1 Symptoms SOB, dizzy on turns SOB SOB SOB SOB   Comments walk test results 2nd full day of exercise -- -- --   Duration -- Progress to 30 minutes of  aerobic without signs/symptoms of physical  distress Continue with 30 min of aerobic exercise without signs/symptoms of physical distress. Continue with 30 min of aerobic exercise without signs/symptoms of physical distress. Continue with 30 min of aerobic exercise without signs/symptoms of physical distress.   Intensity -- THRR unchanged THRR unchanged THRR unchanged THRR unchanged     Progression   Progression -- Continue to progress workloads to maintain intensity without signs/symptoms of physical distress. Continue to progress workloads to maintain intensity without signs/symptoms of physical distress. Continue to progress workloads to maintain intensity without signs/symptoms of physical distress. Continue to progress workloads to maintain intensity without signs/symptoms of physical distress.   Average METs -- 2.95 3.63 3.65 3.83     Resistance Training   Training Prescription -- Yes Yes Yes Yes   Weight -- 3 lb 3 lb 3 lb 4 lb   Reps -- 10-15 10-15 10-15 10-15     Interval Training   Interval Training -- No No No No     Treadmill   MPH -- 2._2 Grade -- 1 1 3.5 3.5   Minutes -- _3 METs -- 2.99 3.71 4.74 4.75     Recumbant Bike   Level -- _4 3.3   Watts -- 25 -- 17 --   Minutes -- _5 METs -- 2.69 3.1 2.41 2.7     NuStep   Level -- _6 Minutes -- _7 METs -- 3.6 4.1 3.8 4.4     Home Exercise Plan   Plans to continue exercise at -- -- Home (comment)  walking, staff videos Home (comment)  walking, staff videos Home (comment)  walking, staff videos   Frequency -- -- Add 3 additional days to program exercise sessions. Add 3 additional days to program exercise sessions. Add 3 additional days to program exercise sessions.   Initial Home Exercises Provided -- -- 03/15/22 03/15/22 03/15/22     Oxygen   Maintain Oxygen Saturation -- 88% or higher 88% or higher 88% or higher 88% or higher    Row Name 04/25/22 1400 05/10/22 0800           Response to Exercise   Blood  Pressure (Admit) 106/68 122/70      Blood Pressure (Exit) 118/72 126/70      Heart Rate (Admit) 67 bpm 75 bpm      Heart Rate (Exercise) 101 bpm 113 bpm      Heart Rate (Exit) 69 bpm 79 bpm      Oxygen Saturation (Admit) 95 % 91 %      Oxygen Saturation (Exercise) 89 %  88 %      Oxygen Saturation (Exit) 95 % 93 %      Rating of Perceived Exertion (Exercise) 13 13      Perceived Dyspnea (Exercise) 1 3      Symptoms SOB SOB      Duration Continue with 30 min of aerobic exercise without signs/symptoms of physical distress. Continue with 30 min of aerobic exercise without signs/symptoms of physical distress.      Intensity THRR unchanged THRR unchanged        Progression   Progression Continue to progress workloads to maintain intensity without signs/symptoms of physical distress. Continue to progress workloads to maintain intensity without signs/symptoms of physical distress.      Average METs 4.83 5.13        Resistance Training   Training Prescription Yes Yes      Weight 4 lb 4 lb      Reps 10-15 10-15        Interval Training   Interval Training No No        Treadmill   MPH -- 3      Grade -- 4      Minutes -- 15      METs -- 4.95        NuStep   Level 4 4      Minutes 15 15      METs 5.4 6.2        REL-XR   Level -- 3      Minutes -- 15      METs -- 3.1        Home Exercise Plan   Plans to continue exercise at Home (comment)  walking, staff videos Home (comment)  walking, staff videos      Frequency Add 3 additional days to program exercise sessions. Add 3 additional days to program exercise sessions.      Initial Home Exercises Provided 03/15/22 03/15/22        Oxygen   Maintain Oxygen Saturation 88% or higher 88% or higher               Exercise Comments:   Exercise Comments     Row Name 02/22/22 4010           Exercise Comments First full day of exercise!  Patient was oriented to gym and equipment including functions, settings, policies, and  procedures.  Patient's individual exercise prescription and treatment plan were reviewed.  All starting workloads were established based on the results of the 6 minute walk test done at initial orientation visit.  The plan for exercise progression was also introduced and progression will be customized based on patient's performance and goals.                Exercise Goals and Review:   Exercise Goals     Row Name 02/17/22 1112             Exercise Goals   Increase Physical Activity Yes       Intervention Provide advice, education, support and counseling about physical activity/exercise needs.;Develop an individualized exercise prescription for aerobic and resistive training based on initial evaluation findings, risk stratification, comorbidities and participant's personal goals.       Expected Outcomes Short Term: Attend rehab on a regular basis to increase amount of physical activity.;Long Term: Add in home exercise to make exercise part of routine and to increase amount of physical activity.;Long Term: Exercising regularly at least 3-5 days a  week.       Increase Strength and Stamina Yes       Intervention Provide advice, education, support and counseling about physical activity/exercise needs.;Develop an individualized exercise prescription for aerobic and resistive training based on initial evaluation findings, risk stratification, comorbidities and participant's personal goals.       Expected Outcomes Short Term: Increase workloads from initial exercise prescription for resistance, speed, and METs.;Short Term: Perform resistance training exercises routinely during rehab and add in resistance training at home;Long Term: Improve cardiorespiratory fitness, muscular endurance and strength as measured by increased METs and functional capacity (6MWT)       Able to understand and use rate of perceived exertion (RPE) scale Yes       Intervention Provide education and explanation on how to use  RPE scale       Expected Outcomes Short Term: Able to use RPE daily in rehab to express subjective intensity level;Long Term:  Able to use RPE to guide intensity level when exercising independently       Able to understand and use Dyspnea scale Yes       Intervention Provide education and explanation on how to use Dyspnea scale       Expected Outcomes Short Term: Able to use Dyspnea scale daily in rehab to express subjective sense of shortness of breath during exertion;Long Term: Able to use Dyspnea scale to guide intensity level when exercising independently       Knowledge and understanding of Target Heart Rate Range (THRR) Yes       Intervention Provide education and explanation of THRR including how the numbers were predicted and where they are located for reference       Expected Outcomes Short Term: Able to state/look up THRR;Short Term: Able to use daily as guideline for intensity in rehab;Long Term: Able to use THRR to govern intensity when exercising independently       Able to check pulse independently Yes       Intervention Provide education and demonstration on how to check pulse in carotid and radial arteries.;Review the importance of being able to check your own pulse for safety during independent exercise       Expected Outcomes Short Term: Able to explain why pulse checking is important during independent exercise;Long Term: Able to check pulse independently and accurately       Understanding of Exercise Prescription Yes       Intervention Provide education, explanation, and written materials on patient's individual exercise prescription       Expected Outcomes Short Term: Able to explain program exercise prescription;Long Term: Able to explain home exercise prescription to exercise independently                Exercise Goals Re-Evaluation :  Exercise Goals Re-Evaluation     Row Name 02/22/22 0752 03/01/22 0759 03/15/22 0805 03/28/22 1426 03/29/22 0802     Exercise Goal  Re-Evaluation   Exercise Goals Review Increase Physical Activity;Able to understand and use rate of perceived exertion (RPE) scale;Knowledge and understanding of Target Heart Rate Range (THRR);Understanding of Exercise Prescription;Increase Strength and Stamina;Able to check pulse independently;Able to understand and use Dyspnea scale Increase Physical Activity;Increase Strength and Stamina;Understanding of Exercise Prescription Increase Physical Activity;Increase Strength and Stamina;Understanding of Exercise Prescription;Able to understand and use rate of perceived exertion (RPE) scale;Able to understand and use Dyspnea scale;Knowledge and understanding of Target Heart Rate Range (THRR);Able to check pulse independently Increase Physical Activity;Increase Strength and Stamina;Understanding of Exercise  Prescription Increase Physical Activity;Increase Strength and Stamina;Understanding of Exercise Prescription   Comments Reviewed RPE and dyspnea scales, THR and program prescription with pt today.  Pt voiced understanding and was given a copy of goals to take home. Lamis is doing well to start rehab. She has tolerated level 3 on the T4. She has also done well on the treadmill at 2.2 mph. We will continue to monitor her progress in the program. Laqueena is doing well in rehab.  She is already walking on the days she is not in class at least three times a week.  Reviewed home exercise with pt today.  Pt plans to walk and use staff videos at home for exercise.  Reviewed THR, pulse, RPE, sign and symptoms, pulse oximetery and when to call 911 or MD.  Also discussed weather considerations and indoor options.  Pt voiced understanding. Delois is doing well in rehab.  She is up to level 4 on the NuStep and 17 watts on the bike. She has also moved up her treadmill to 3.0 mph!  We will continue to monitor her progress. Fareeha reports that she has made improvements in her strength and stamina. Her SOB has also improved with  exercise and she is using PLB to control SOB during exersion. She does not have a pulse ox to monitor SaO2 levels but plans to get one.   Expected Outcomes Short: Use RPE daily to regulate intensity. Long: Follow program prescription in THR. Short: continue to increase treadmill speed. Long: Continue to increase MET levels. Short: Continue to walk and try staff videos Long: Conitnue to exercise independently Short: Try 4 lb weights Long: Continue to improve stamina Short: buy a pulse oximeter. Long: become independent with exercise program.    Row Name 04/11/22 1512 04/19/22 0821 04/25/22 1428 05/10/22 0853       Exercise Goal Re-Evaluation   Exercise Goals Review Increase Physical Activity;Increase Strength and Stamina;Understanding of Exercise Prescription Increase Physical Activity;Increase Strength and Stamina;Understanding of Exercise Prescription Increase Physical Activity;Increase Strength and Stamina;Understanding of Exercise Prescription Increase Physical Activity;Increase Strength and Stamina;Understanding of Exercise Prescription    Comments Gracey continues to do well in rehab. She has reached working over 4 METS on the T4 Nustep. She increased to 4 lbs for handweights. She is not qutie hitting her THR. Will continue to monitor. Elanora is doing well in rehab.  Her breathing is starting to feel better too.  She is starting to feel better overall.  She is walking on her off days for 45-60 min depending on breathing. Elmira is doing well in rehab. She recently improved her overall average MET level to 4.83 METs. She also has tolerated level 4 on the T4 for the full 30 minutes of exercise. She has also done well with 4 lb hand weights for resistance training. We will continue to monitor her progress in the program. Margit continues to do well in rehab. She improved her overall average MET level to 5.13 METs. She also increased her incline on the treadmill to 4% while maintaining a speed of 3 mph. She  began using the XR machine as well, and tolerated level 3 well. We will continue to monitor her progress in the program.    Expected Outcomes Short: Incread load and watts on RB Long: Continue to increase overall MET level Short: Continue to exercise regularly Long: Continue to improve stamina Short: Continue to increase workload on the treadmill. Long: Continue to increase strength and stamina. Short: Continue to increase  workload on the treadmill. Long: Continue to increase strength and stamina.             Discharge Exercise Prescription (Final Exercise Prescription Changes):  Exercise Prescription Changes - 05/10/22 0800       Response to Exercise   Blood Pressure (Admit) 122/70    Blood Pressure (Exit) 126/70    Heart Rate (Admit) 75 bpm    Heart Rate (Exercise) 113 bpm    Heart Rate (Exit) 79 bpm    Oxygen Saturation (Admit) 91 %    Oxygen Saturation (Exercise) 88 %    Oxygen Saturation (Exit) 93 %    Rating of Perceived Exertion (Exercise) 13    Perceived Dyspnea (Exercise) 3    Symptoms SOB    Duration Continue with 30 min of aerobic exercise without signs/symptoms of physical distress.    Intensity THRR unchanged      Progression   Progression Continue to progress workloads to maintain intensity without signs/symptoms of physical distress.    Average METs 5.13      Resistance Training   Training Prescription Yes    Weight 4 lb    Reps 10-15      Interval Training   Interval Training No      Treadmill   MPH 3    Grade 4    Minutes 15    METs 4.95      NuStep   Level 4    Minutes 15    METs 6.2      REL-XR   Level 3    Minutes 15    METs 3.1      Home Exercise Plan   Plans to continue exercise at Home (comment)   walking, staff videos   Frequency Add 3 additional days to program exercise sessions.    Initial Home Exercises Provided 03/15/22      Oxygen   Maintain Oxygen Saturation 88% or higher             Nutrition:  Target Goals:  Understanding of nutrition guidelines, daily intake of sodium <1547m, cholesterol <2059m calories 30% from fat and 7% or less from saturated fats, daily to have 5 or more servings of fruits and vegetables.  Education: All About Nutrition: -Group instruction provided by verbal, written material, interactive activities, discussions, models, and posters to present general guidelines for heart healthy nutrition including fat, fiber, MyPlate, the role of sodium in heart healthy nutrition, utilization of the nutrition label, and utilization of this knowledge for meal planning. Follow up email sent as well. Written material given at graduation.   Biometrics:  Pre Biometrics - 02/17/22 1112       Pre Biometrics   Height _0  (1.727 m)    Weight 247 lb 12.8 oz (112.4 kg)    BMI (Calculated) 37.69    Single Leg Stand 4.4 seconds              Nutrition Therapy Plan and Nutrition Goals:  Nutrition Therapy & Goals - 03/07/22 0816       Nutrition Therapy   Diet Heart healthy, low Na, pulmonary MNT    Drug/Food Interactions Statins/Certain Fruits    Protein (specify units) 100g   x1.2/kg adjusted body weight   Fiber 25 grams    Whole Grain Foods 3 servings    Saturated Fats 16 max. grams    Fruits and Vegetables 8 servings/day    Sodium 2 grams      Personal Nutrition Goals  Nutrition Goal ST: try whole wheat bread, try out low prep meals follow MyPlate LT: Follow MyPlate guidelines, 235T protein/day, 25g fiber/day    Comments 66 y.o. F admitted to pulmonary rehab for ILD. PMHx includes connective tissue disease, HTN, GERD, systemic lupus erythematosus, fibromyalgia, burning mouth syndrome, anxiety, PTSD. Relevant medications includes lipitor, biotin, wellbutrin, lamotrigine, vit D, klonopin, hydroxychloroquine, linzess, melatonin, metformin, omeprazole, miralax, prednisone, trazadone, venlafaxine. PYP Score: 62. Vegetables & Fruits 6/12. Breads, Grains & Cereals 7/12. Red & Processed  Meat 12/12. Poultry 0/2. Fish & Shellfish 0/4. Beans, Nuts & Seeds 1/4. Milk & Dairy Foods 5/6. Toppings, Oils, Seasonings & Salt 17/20. Sweets, Snacks & Restaurant Food 6/14. Beverages 8/10.   She does not cook much. B: strawberries, breakfast bar L: peanut butter and banana sandwich S: fruit (grapes) D: chicken salad sandwich, chili, baked chicken. She uses honey wheat bread, she will sometimes cook food and then freeze them such as chilli and beans, she likes sweet potato fries. Drinks: water. Discussed heart healthy eating and pulmonary MNT.      Intervention Plan   Intervention Prescribe, educate and counsel regarding individualized specific dietary modifications aiming towards targeted core components such as weight, hypertension, lipid management, diabetes, heart failure and other comorbidities.    Expected Outcomes Short Term Goal: Understand basic principles of dietary content, such as calories, fat, sodium, cholesterol and nutrients.;Short Term Goal: A plan has been developed with personal nutrition goals set during dietitian appointment.;Long Term Goal: Adherence to prescribed nutrition plan.             Nutrition Assessments:  MEDIFICTS Score Key: ?70 Need to make dietary changes  40-70 Heart Healthy Diet ? 40 Therapeutic Level Cholesterol Diet  Flowsheet Row Pulmonary Rehab from 02/17/2022 in [facility] Cardiac and Pulmonary Rehab  Picture Your Plate Total Score on Admission 62      Picture Your Plate Scores: <73 Unhealthy dietary pattern with much room for improvement. 41-50 Dietary pattern unlikely to meet recommendations for good health and room for improvement. 51-60 More healthful dietary pattern, with some room for improvement.  >60 Healthy dietary pattern, although there may be some specific behaviors that could be improved.   Nutrition Goals Re-Evaluation:  Nutrition Goals Re-Evaluation     [place] Name 03/29/22 0807 04/19/22 0827 04/28/22 2202         Goals    Nutrition Goal -- Short: Continue to read food labels. Long: help control risk factors with nutrition changes. --     Comment Patient reports that she has been trying to encorporate more whole grains and fruit into her diet. She is working on reading labels to make sure her foods have good amounts of protein and fiber. Mariel is doing well with her diet.  She ate bad when her anxiety kicked up with the PVCs but now she is getting back to it.  She wants to get back down to 160 lb especially if she can come off the steroids.  She is getting back to the habit of reading labels and being aware of her calories and food intake. Kourtlyn reports needing help with soft foods, provided handouts for this as well as reviewed soft foods that are still heart healthy. Eleanna would like to know how to enhance flavor of greek yogurt - discussed being mindful of added sugar, but she could make it sweeter with honey, fruit, or peanut butter. If she would like to have it more savory she could add roasted vegetables, pesto, or make a  dip like tzatziki. Reviewed heart healthy foods and balanced meals/snacks; Aide reports still having general heart healthy eating handouts given at initial consultation which includes foods recommended/not recommended, guidelines for general heart healthy eating such as recommended amount of saturated fat, and MyPlate guidelines.     Expected Outcome Short: Continue to read food labels. Long: help control risk factors with nutrition changes. Short: Get back to diet again after anxiety up kick Long: Continue to follow food label and MyPlate guidelines --              Nutrition Goals Discharge (Final Nutrition Goals Re-Evaluation):  Nutrition Goals Re-Evaluation - 04/28/22 0808       Goals   Comment Tennelle reports needing help with soft foods, provided handouts for this as well as reviewed soft foods that are still heart healthy. Labria would like to know how to enhance flavor of greek yogurt  - discussed being mindful of added sugar, but she could make it sweeter with honey, fruit, or peanut butter. If she would like to have it more savory she could add roasted vegetables, pesto, or make a dip like tzatziki. Reviewed heart healthy foods and balanced meals/snacks; Sayler reports still having general heart healthy eating handouts given at initial consultation which includes foods recommended/not recommended, guidelines for general heart healthy eating such as recommended amount of saturated fat, and MyPlate guidelines.             Psychosocial: Target Goals: Acknowledge presence or absence of significant depression and/or stress, maximize coping skills, provide positive support system. Participant is able to verbalize types and ability to use techniques and skills needed for reducing stress and depression.   Education: Stress, Anxiety, and Depression - Group verbal and visual presentation to define topics covered.  Reviews how body is impacted by stress, anxiety, and depression.  Also discusses healthy ways to reduce stress and to treat/manage anxiety and depression.  Written material given at graduation. Flowsheet Row Pulmonary Rehab from 04/21/2022 in [facility] Cardiac and Pulmonary Rehab  Date 02/24/22  Educator [facility]  Instruction Review Code 1- [employer] Understanding       Education: Sleep Hygiene -Provides group verbal and written instruction about how sleep can affect your health.  Define sleep hygiene, discuss sleep cycles and impact of sleep habits. Review good sleep hygiene tips.    Initial Review & Psychosocial Screening:  Initial Psych Review & Screening - 02/02/22 1421       Initial Review   Current issues with Current Psychotropic Meds;Current Depression;Current Anxiety/Panic;Current Stress Concerns    Source of Stress Concerns Chronic Illness    Comments several health issues can cause her to feel down. Tries to focus on the positive.      Family Dynamics   Good  Support System? Yes   son and daughter, friend     Barriers   Psychosocial barriers to participate in program There are no identifiable barriers or psychosocial needs.;The patient should benefit from training in stress management and relaxation.      Screening Interventions   Interventions Encouraged to exercise;To provide support and resources with identified psychosocial needs;Provide feedback about the scores to participant    Expected Outcomes Short Term goal: Utilizing psychosocial counselor, staff and physician to assist with identification of specific Stressors or current issues interfering with healing process. Setting desired goal for each stressor or current issue identified.;Long Term Goal: Stressors or current issues are controlled or eliminated.;Short Term goal: Identification and review with participant of any Quality  of Life or Depression concerns found by scoring the questionnaire.;Long Term goal: The participant improves quality of Life and PHQ9 Scores as seen by post scores and/or verbalization of changes             Quality of Life Scores:  Scores of 19 and below usually indicate a poorer quality of life in these areas.  A difference of  2-3 points is a clinically meaningful difference.  A difference of 2-3 points in the total score of the Quality of Life Index has been associated with significant improvement in overall quality of life, self-image, physical symptoms, and general health in studies assessing change in quality of life.  PHQ-9: Review Flowsheet  More data exists      04/15/2022 04/12/2022 03/15/2022 02/17/2022 12/20/2021  Depression screen PHQ 2/9  Decreased Interest _0 0  Down, Depressed, Hopeless _1 0  PHQ - 2 Score _2 0  Altered sleeping 1 0 2 2 0  Tired, decreased energy _3 0  Change in appetite 1 3 0 0 0  Feeling bad or failure about yourself  _4 Trouble concentrating 1 1 0 2 0  Moving slowly or fidgety/restless 0 _5 0   Suicidal thoughts 0 0 0 0 0  PHQ-9 Score _6 Difficult doing work/chores - Very difficult Somewhat difficult Somewhat difficult -   Interpretation of Total Score  Total Score Depression Severity:  1-4 = Minimal depression, 5-9 = Mild depression, 10-14 = Moderate depression, 15-19 = Moderately severe depression, 20-27 = Severe depression   Psychosocial Evaluation and Intervention:  Psychosocial Evaluation - 02/02/22 1435       Psychosocial Evaluation & Interventions   Interventions Encouraged to exercise with the program and follow exercise prescription    Comments Pizzolato has no barriers to attending the program. She lives alone. She has a son and a daughter that are her support along with her firends. She is on meds for depression and anxiety. She has multiple illnesses and it can bring her down at times. She tries hard to look at the bright side to prevent being pulled down. She wants to see if she can help her lungs heal as much as possible. She is reday to get started.    Expected Outcomes STG Namiyah is able to attend all scheduled sessions. She learns rlaxation and de stress exercises while here. LTG Octivia is able to continue her exercise progression and manage her stress and depression easier.    Continue Psychosocial Services  Follow up required by staff             Psychosocial Re-Evaluation:  Psychosocial Re-Evaluation     [place] Name 03/15/22 0813 03/29/22 0811 04/19/22 0825         Psychosocial Re-Evaluation   Current issues with Current Psychotropic Meds;History of Depression;Current Anxiety/Panic;Current Depression;Current Sleep Concerns Current Psychotropic Meds;History of Depression;Current Anxiety/Panic;Current Depression;Current Sleep Concerns Current Psychotropic Meds;History of Depression;Current Anxiety/Panic;Current Depression;Current Sleep Concerns     Comments Jamielyn is doing well mentally.  She feels that she is doing a little better with her  depression currently as she is is just feeling better overall.  Her PHQ has improved by 2 pts over last couple of weeks. She has been struggling with sleep recently for both going to sleep and waking up.  She lays in bed for a while before going to sleep.  She wakes early and unable to go back to sleep.  She takes melatonin each nigh in tablet form.  She is going to try to up her dose some and take 30 min before bed.   She is feeling well managed on her meds.  She does get frustrated with feeling like she still gets SOB. Patient reports that she is going well mentally. She is still on treatment but everything has been stable with no new concerns. She has reported that her SOB is improving since starting the program. Annella had a scare over weekend with PVCs and went to ED.  It triggered her anxiety and still trying to calm down.  She has a referral to cardiology and an appointment with pulmonary tomorrow.  She is sleeping good. She just wants to find out why they are happening and to make sure she's okay.  Her son has been worried about her and keeps calling to check in on her.     Expected Outcomes Short: Try to increase melatonin to help with sleep Long: Continue to focus on positive and exercies for mental boost. Short: continue in current treatment. Long: continue to work on positive mental health  behaviors. Short: Talk to doctor about PVCs  Long: Continue to focus on positive     Interventions Encouraged to attend Pulmonary Rehabilitation for the exercise;Stress management education Encouraged to attend Pulmonary Rehabilitation for the exercise;Stress management education Encouraged to attend Pulmonary Rehabilitation for the exercise;Stress management education     Continue Psychosocial Services  Follow up required by staff Follow up required by staff --              Psychosocial Discharge (Final Psychosocial Re-Evaluation):  Psychosocial Re-Evaluation - 04/19/22 0825       Psychosocial  Re-Evaluation   Current issues with Current Psychotropic Meds;History of Depression;Current Anxiety/Panic;Current Depression;Current Sleep Concerns    Comments Sarh had a scare over weekend with PVCs and went to ED.  It triggered her anxiety and still trying to calm down.  She has a referral to cardiology and an appointment with pulmonary tomorrow.  She is sleeping good. She just wants to find out why they are happening and to make sure she's okay.  Her son has been worried about her and keeps calling to check in on her.    Expected Outcomes Short: Talk to doctor about PVCs  Long: Continue to focus on positive    Interventions Encouraged to attend Pulmonary Rehabilitation for the exercise;Stress management education             Education: Education Goals: Education classes will be provided on a weekly basis, covering required topics. Participant will state understanding/return demonstration of topics presented.  Learning Barriers/Preferences:   General Pulmonary Education Topics:  Infection Prevention: - Provides verbal and written material to individual with discussion of infection control including proper hand washing and proper equipment cleaning during exercise session. Flowsheet Row Pulmonary Rehab from 04/21/2022 in [facility] Cardiac and Pulmonary Rehab  Date 02/17/22  Educator [facility]  Instruction Review Code 1- Verbalizes Understanding       Falls Prevention: - Provides verbal and written material to individual with discussion of falls prevention and safety. Flowsheet Row Pulmonary Rehab from 04/21/2022 in [facility] Cardiac and Pulmonary Rehab  Date 02/02/22  Educator SB  Instruction Review Code 1- Verbalizes Understanding       Chronic Lung Disease Review: - Group verbal instruction with posters, models, PowerPoint presentations and videos,  to review new updates, new  respiratory medications, new advancements in procedures and treatments. Providing information on websites and "800"  numbers for continued self-education. Includes information about supplement oxygen, available portable oxygen systems, continuous and intermittent flow rates, oxygen safety, concentrators, and Medicare reimbursement for oxygen. Explanation of Pulmonary Drugs, including class, frequency, complications, importance of spacers, rinsing mouth after steroid MDI's, and proper cleaning methods for nebulizers. Review of basic lung anatomy and physiology related to function, structure, and complications of lung disease. Review of risk factors. Discussion about methods for diagnosing sleep apnea and types of masks and machines for OSA. Includes a review of the use of types of environmental controls: home humidity, furnaces, filters, dust mite/pet prevention, HEPA vacuums. Discussion about weather changes, air quality and the benefits of nasal washing. Instruction on Warning signs, infection symptoms, calling MD promptly, preventive modes, and value of vaccinations. Review of effective airway clearance, coughing and/or vibration techniques. Emphasizing that all should Create an Action Plan. Written material given at graduation. Flowsheet Row Pulmonary Rehab from 04/21/2022 in [facility] Cardiac and Pulmonary Rehab  Education need identified 02/17/22  Date 02/17/22  Educator [facility]  Instruction Review Code 1- Verbalizes Understanding       AED/CPR: - Group verbal and written instruction with the use of models to demonstrate the basic use of the AED with the basic ABC's of resuscitation.    Anatomy and Cardiac Procedures: - Group verbal and visual presentation and models provide information about basic cardiac anatomy and function. Reviews the testing methods done to diagnose heart disease and the outcomes of the test results. Describes the treatment choices: Medical Management, Angioplasty, or Coronary Bypass Surgery for treating various heart conditions including Myocardial Infarction, Angina, Valve Disease, and Cardiac  Arrhythmias.  Written material given at graduation.   Medication Safety: - Group verbal and visual instruction to review commonly prescribed medications for heart and lung disease. Reviews the medication, class of the drug, and side effects. Includes the steps to properly store meds and maintain the prescription regimen.  Written material given at graduation.   Other: -Provides group and verbal instruction on various topics (see comments)   Knowledge Questionnaire Score:  Knowledge Questionnaire Score - 02/17/22 1114       Knowledge Questionnaire Score   Pre Score 12/18              Core Components/Risk Factors/Patient Goals at Admission:  Personal Goals and Risk Factors at Admission - 02/17/22 1114       Core Components/Risk Factors/Patient Goals on Admission    Weight Management Yes;Weight Loss;Obesity    Intervention Weight Management: Develop a combined nutrition and exercise program designed to reach desired caloric intake, while maintaining appropriate intake of nutrient and fiber, sodium and fats, and appropriate energy expenditure required for the weight goal.;Weight Management/Obesity: Establish reasonable short term and long term weight goals.;Obesity: Provide education and appropriate resources to help participant work on and attain dietary goals.;Weight Management: Provide education and appropriate resources to help participant work on and attain dietary goals.    Admit Weight 247 lb 12.8 oz (112.4 kg)    Goal Weight: Short Term 243 lb (110.2 kg)    Goal Weight: Long Term 200 lb (90.7 kg)    Expected Outcomes Short Term: Continue to assess and modify interventions until short term weight is achieved;Weight Loss: Understanding of general recommendations for a balanced deficit meal plan, which promotes 1-2 lb weight loss per week and includes a negative energy balance of [phone] kcal/d;Understanding recommendations for meals to include 15-35%  energy as protein, 25-35%  energy from fat, 35-60% energy from carbohydrates, less than 247m of dietary cholesterol, 20-35 gm of total fiber daily;Understanding of distribution of calorie intake throughout the day with the consumption of 4-5 meals/snacks    Improve shortness of breath with ADL's Yes    Intervention Provide education, individualized exercise plan and daily activity instruction to help decrease symptoms of SOB with activities of daily living.    Expected Outcomes Short Term: Improve cardiorespiratory fitness to achieve a reduction of symptoms when performing ADLs;Long Term: Be able to perform more ADLs without symptoms or delay the onset of symptoms    Increase knowledge of respiratory medications and ability to use respiratory devices properly  Yes    Intervention Provide education and demonstration as needed of appropriate use of medications, inhalers, and oxygen therapy.    Expected Outcomes Short Term: Achieves understanding of medications use. Understands that oxygen is a medication prescribed by physician. Demonstrates appropriate use of inhaler and oxygen therapy.;Long Term: Maintain appropriate use of medications, inhalers, and oxygen therapy.    Hypertension Yes    Intervention Provide education on lifestyle modifcations including regular physical activity/exercise, weight management, moderate sodium restriction and increased consumption of fresh fruit, vegetables, and low fat dairy, alcohol moderation, and smoking cessation.;Monitor prescription use compliance.    Expected Outcomes Short Term: Continued assessment and intervention until BP is < 140/939mHG in hypertensive participants. < 130/8021mG in hypertensive participants with diabetes, heart failure or chronic kidney disease.;Long Term: Maintenance of blood pressure at goal levels.    Lipids Yes    Intervention Provide education and support for participant on nutrition & aerobic/resistive exercise along with prescribed medications to achieve LDL  <26m19mDL >40mg10m Expected Outcomes Short Term: Participant states understanding of desired cholesterol values and is compliant with medications prescribed. Participant is following exercise prescription and nutrition guidelines.;Long Term: Cholesterol controlled with medications as prescribed, with individualized exercise RX and with personalized nutrition plan. Value goals: LDL < 26mg,57m > 40 mg.             Education:Diabetes - Individual verbal and written instruction to review signs/symptoms of diabetes, desired ranges of glucose level fasting, after meals and with exercise. Acknowledge that pre and post exercise glucose checks will be done for 3 sessions at entry of program.   Know Your Numbers and Heart Failure: - Group verbal and visual instruction to discuss disease risk factors for cardiac and pulmonary disease and treatment options.  Reviews associated critical values for Overweight/Obesity, Hypertension, Cholesterol, and Diabetes.  Discusses basics of heart failure: signs/symptoms and treatments.  Introduces Heart Failure Zone chart for action plan for heart failure.  Written material given at graduation.   Core Components/Risk Factors/Patient Goals Review:   Goals and Risk Factor Review     Row Name 03/15/22 0823 03/29/22 0809 04/19/22 0822  [id number]   Core Components/Risk Factors/Patient Goals Review   Personal Goals Review Weight Management/Obesity;Increase knowledge of respiratory medications and ability to use respiratory devices properly.;Improve shortness of breath with ADL's;Hypertension;Lipids Weight Management/Obesity;Increase knowledge of respiratory medications and ability to use respiratory devices properly.;Improve shortness of breath with ADL's;Hypertension;Lipids Weight Management/Obesity;Increase knowledge of respiratory medications and ability to use respiratory devices properly.;Improve shortness of breath with ADL's;Hypertension;Lipids     Review SandraShimaoing well in rehab.  Her weight is trending down.  She is down to 246 lb today.  She is still getting SOB some days.  Today, was the first day that she felt winded before class and during class, so she knows that it is getting better overall.  She is on prednisone and doing well with her inhalers to help her breathing.  Her pressures continue to do well and she is checking them three times a week at home. Kathalene reports that she has had steady weight loss of about 1 lb a week. She montiors her BP at home and reports that they are in acceptable ranges. She also reports improvements in SOB and reports using PLB to help control this. Nesiah was in ED over weekend for PVCs and now has a referral for cardiology to look at them.  She was SOB and dizziheaded.  They talked about her doing a Zio patch to review.  Her breathing is feeling better since when she started.  She is now able to do more before getting SOB.  She is doing well on her respiratory meds.  She is doing well with her pressures.  Her weight continues to trend down.  They talked about changing some meds to include a beta blocker to help with heart.     Expected Outcomes Short: Continue to work on breathing Long: Conitnue to work on [employer] loss Short: continue to use PLB to help control SOB. Long: continue to work on weight loss goal. Short: Get cardiology appt to see about PVCs  Long; Continue to monitor risk factors.              Core Components/Risk Factors/Patient Goals at Discharge (Final Review):   Goals and Risk Factor Review - 04/19/22 3833       Core Components/Risk Factors/Patient Goals Review   Personal Goals Review Weight Management/Obesity;Increase knowledge of respiratory medications and ability to use respiratory devices properly.;Improve shortness of breath with ADL's;Hypertension;Lipids    Review Alizia was in ED over weekend for PVCs and now has a referral for cardiology to look at them.  She was SOB and dizziheaded.  They  talked about her doing a Zio patch to review.  Her breathing is feeling better since when she started.  She is now able to do more before getting SOB.  She is doing well on her respiratory meds.  She is doing well with her pressures.  Her weight continues to trend down.  They talked about changing some meds to include a beta blocker to help with heart.    Expected Outcomes Short: Get cardiology appt to see about PVCs  Long; Continue to monitor risk factors.             ITP Comments:  ITP Comments     Row Name 02/02/22 1433 02/17/22 1044 02/22/22 0752 03/07/22 0816 03/16/22 0953   ITP Comments Virtual orientation call completed today. shehas an appointment on Date: 02/09/2022  for EP eval and gym Orientation.  Documentation of diagnosis can be found in [facility] Date: 12/29/2021 . Completed 6MWT and gym orientation. Initial ITP created and sent for review to Dr. Zetta Bills, Medical Director. First full day of exercise!  Patient was oriented to gym and equipment including functions, settings, policies, and procedures.  Patient's individual exercise prescription and treatment plan were reviewed.  All starting workloads were established based on the results of the 6 minute walk test done at initial orientation visit.  The plan for exercise progression was also introduced and progression will be customized based on patient's performance and goals. Completed initial RD consultation 30 Day review completed. Medical Director ITP  review done, changes made as directed, and signed approval by Medical Director.    [place] Name 04/13/22 0754 05/11/22 0821         ITP Comments 30 Day review completed. Medical Director ITP review done, changes made as directed, and signed approval by Medical Director. 30 Day review completed. Medical Director ITP review done, changes made as directed, and signed approval by Medical Director.               Comments: 30 day review

## 2022-05-12 ENCOUNTER — Encounter: Payer: Medicare Other | Admitting: *Deleted

## 2022-05-12 DIAGNOSIS — J849 Interstitial pulmonary disease, unspecified: Secondary | ICD-10-CM | POA: Diagnosis not present

## 2022-05-12 NOTE — Progress Notes (Signed)
Daily Session Note  Patient Details  Name: Nicole Ballard MRN: [id number] Date of Birth: [birth year] Referring Provider:   Flowsheet Row Pulmonary Rehab from 02/17/2022 in [facility] Cardiac and Pulmonary Rehab  Referring Provider Despina Arias MD       Encounter Date: 05/12/2022  Check In:  Session Check In - 05/12/22 0808       Check-In   Supervising physician immediately available to respond to emergencies See telemetry face sheet for immediately available ER MD    Location [facility]-Cardiac & Pulmonary Rehab    Staff Present Heath Lark, RN, BSN, CCRP;Joseph [place], RCP,RRT,BSRT;Kelly Dickinson, [place], ACSM CEP, Exercise Physiologist    Virtual Visit No    Medication changes reported     No    Fall or balance concerns reported    No    Warm-up and Cool-down Performed on first and last piece of equipment    Resistance Training Performed Yes    VAD Patient? No    PAD/SET Patient? No      Pain Assessment   Currently in Pain? No/denies                Social History   Tobacco Use  Smoking Status Never  Smokeless Tobacco Never    Goals Met:  Proper associated with RPD/PD & O2 Sat Independence with exercise equipment Exercise tolerated well No report of concerns or symptoms today  Goals Unmet:  Not Applicable  Comments: Pt able to follow exercise prescription today without complaint.  Will continue to monitor for progression.    Dr. Emily Filbert is Medical Director for [place].  Dr. Ottie Glazier is Medical Director for [facility] Pulmonary Rehabilitation.

## 2022-05-13 ENCOUNTER — Ambulatory Visit: Admit: 2022-05-13 | Discharge: 2022-05-13 | Payer: MEDICARE | Attending: Adult Health | Primary: Adult Health

## 2022-05-13 DIAGNOSIS — R002 Palpitations: Principal | ICD-10-CM

## 2022-05-13 MED ORDER — ATENOLOL 25 MG TABLET
ORAL_TABLET | Freq: Two times a day (BID) | ORAL | 1 refills | 90 days | Status: CP
Start: 2022-05-13 — End: ?

## 2022-05-13 NOTE — Unmapped (Signed)
Ziopatch x 1 wk    Increase atenolol to twice daily    Maintain good hydration

## 2022-05-13 NOTE — Unmapped (Signed)
Patient has been ordered a Zio    Patient given instructions and education regarding Zio. Yes   Printed material provided to patient. Yes  Verbalized understanding. Yes  Interpretor used? No.     Zio patch placed on Left upper chest. Instructions given to patient on how to push button if any events occurred. Patient verbalized understanding. Patient to wear Zio patch for 7 days.   Patch placed at 12:00 noon  serial number: [id number]

## 2022-05-13 NOTE — Unmapped (Signed)
DIVISION OF CARDIOLOGY   [facility] [place]        Date of Service: 05/13/2022    Assessment/Plan     1. Palpitations  Has had daily episodes of skipped beats over past month with ED visit x2.  Overall very bothersome for her.  Likely PVCs based on review of outside ED notes.  Recent labs including TSH reviewed and unremarkable.  EKG today also unremarkable.  Echo in December 2022 showed structurally normal heart.  No significant caffeine intake, staying well-hydrated.  We will get 7-day Zio patch for further evaluation.  We will also increase atenolol to 25 mg twice daily and see her back in 6 weeks.  - ECG 12 lead  - External ECG-3 days to 7 days (ZIO XT)    Return to clinic:    Return in about 6 weeks (around 06/24/2022).    I personally spent 31 minutes face-to-face and non-face-to-face in the care of this patient, which includes all pre, intra, and post visit time on the date of service.      Subjective:   MVH:QIONGEX Caitlin Ready, MD  Chief complaint:  66 y.o. female with HTN, SLE, post inflammatory pulmonary fibrosis who presents for palpitations    History of Present Illness:  Has been having bad heart palpitations requiring ED eval x2 at OSH. Was told it was premature beat based on telemetry. Seen by pulmonology recently and Zio ordered but not placed.   Occurring 4-5 times per day, most days. Feels like a skipped beat. Sometimes will have associated nausea.   Very bothersome to her.   Denies chest pain, worsening SOB, swelling, LH/dizziness.     Caffeine - rare. Drinks decaf  H20 - 3 x 16 oz  ETOH- none    Endorses high stress levels.   She is doing pulmonary rehab.   Had prior sleep study neg for sleep apnea    Cardiovascular History:  HTN  Previous exercise stress echocardiogram performed at [facility] in May 2021 showed no evidence of ischemia  Previous CT angiogram performed in May 2021 showed no evidence of coronary calcification, this was a PE protocol CT scan which also showed evidence of pulmonary embolism    Past Medical History  Patient Active Problem List   Diagnosis    Hypertension, benign    Benign neoplasm of pituitary gland and craniopharyngeal duct (pouch) (CMS-HCC)    Chronic kidney disease, stage I    Dysphonia    Mixed urge and stress incontinence    Myalgia and myositis    Osteoarthrosis    Proteinuria    Sensorineural hearing loss    VIN II (vulvar intraepithelial neoplasia II)    Family history of breast cancer    Pain medication agreement signed    Episcleritis    SLE (systemic lupus erythematosus) (CMS-HCC)    S/P total knee replacement    Asthma    Postinflammatory pulmonary fibrosis (CMS-HCC)    Other diseases of vocal cords    Regurgitation    Epigastric burning sensation    Aspiration pneumonitis (CMS-HCC)    Back pain    Cough    Multiple pulmonary nodules    Shortness of breath    Vocal cord dysfunction    Screening for cardiovascular condition    Obesity with body mass index 30 or greater    Generalized anxiety disorder    Osteoarthritis    Chronic constipation    Chronic recurrent major depressive disorder (CMS-HCC)  Dyslipidemia    Obesity, diabetes, and hypertension syndrome (CMS-HCC)    Intertrigo    Fibrositis    Gastroesophageal reflux disease without esophagitis    Vitamin D deficiency    Encounter for screening for cardiovascular disorders    Seborrhea capitis    Perennial allergic rhinitis with seasonal variation    Moderate dysplasia of vulva    Spinal stenosis of lumbar region    Keratosis pilaris    Fibromyalgia    Insomnia, persistent    Neck pain    Arthritis, degenerative    Asthma, chronic    Auditory impairment    Bronchiolitis obliterans organizing pneumonia (CMS-HCC)    Chronic nausea    Eczema intertrigo    H/O aspiration pneumonitis    H/O total knee replacement    Lung involvement in systemic lupus erythematosus (CMS-HCC)    Lung nodule, multiple    Neurogenic claudication    Pulmonary nodules    Treadmill stress test negative for angina pectoris    Vertigo    Hyperglycemia    Otalgia    Immunocompromised (CMS-HCC)       Medications:  Current Outpatient Medications   Medication Sig Dispense Refill    amLODIPine (NORVASC) 2.5 MG tablet Take 3 tablets (7.5 mg total) by mouth daily.      aspirin (ECOTRIN) 81 MG tablet Take 1 tablet (81 mg total) by mouth daily.      atorvastatin (LIPITOR) 40 MG tablet Take 1 tablet (40 mg total) by mouth daily.      biotin 1 mg cap Take by mouth.      buPROPion (WELLBUTRIN XL) 300 MG 24 hr tablet Take 1 tablet (300 mg total) by mouth daily. (Patient taking differently: Take 1 tablet (300 mg total) by mouth every morning.) 90 tablet 1    calcipotriene (DOVONOX) 0.005 % ointment Apply topically daily. Use in skin folds and on the lower leg. 120 g 2    clobetasoL (TEMOVATE) 0.05 % ointment Apply topically Two (2) times a week. 30 g 3    clonazePAM (KLONOPIN) 0.5 MG tablet Take 0.5 tablets (0.25 mg total) by mouth nightly. 15 tablet 2    clotrimazole-betamethasone (LOTRISONE) 1-0.05 % cream Apply 1 application. topically Two (2) times a day.      cyclobenzaprine (FLEXERIL) 5 MG tablet Take 1 tablet (5 mg total) by mouth nightly as needed for muscle spasms. 60 tablet 2    diclofenac sodium (VOLTAREN) 1 % gel Apply 2 g topically two (2) times a day as needed for arthritis. 300 g 5    dicyclomine (BENTYL) 20 mg tablet Take 1 tablet (20 mg total) by mouth daily as needed.      famotidine (PEPCID) 20 MG tablet Take 1 tablet (20 mg total) by mouth Two (2) times a day. 200 tablet 2    fluticasone propionate (FLONASE) 50 mcg/actuation nasal spray       fluticasone propionate (FLOVENT HFA) 220 mcg/actuation inhaler Inhale 1 puff Two (2) times a day. 12 g 11    gabapentin (NEURONTIN) 300 MG capsule Take 1 capsule (300 mg total) by mouth Three (3) times a day. 90 capsule 2    hydrALAZINE (APRESOLINE) 10 MG tablet       hydrOXYchloroQUINE (PLAQUENIL) 200 mg tablet TAKE 1 TABLET BY MOUTH  TWICE DAILY 180 tablet 3    inhalational spacing device (AEROCHAMBER MV) Spcr 1 each by Miscellaneous route two (2) times a day. With Fovent 1 each 0  ketoconazole (NIZORAL) 2 % cream APP EXT AA D  0    lamoTRIgine (LAMICTAL) 200 MG tablet TAKE 1 TABLET BY MOUTH  TWICE DAILY 180 tablet 3    LINZESS 145 mcg capsule TAKE 1 CAPSULE(145 MCG) BY MOUTH DAILY 90 capsule 3    melatonin 10 mg Tab Take 1 tablet by mouth.      mupirocin (BACTROBAN) 2 % ointment APP EXT IEN BID  0    mycophenolate (MYFORTIC) 360 MG TbEC Take 2 tablets (720 mg total) by mouth two (2) times a day. 360 tablet 3    omeprazole (PRILOSEC) 40 MG capsule Take 1 capsule (40 mg total) by mouth two (2) times a day. 90 capsule 3    ondansetron (ZOFRAN) 4 MG tablet       polyethylene glycol (GLYCOLAX) 17 gram/dose powder       predniSONE (DELTASONE) 2.5 MG tablet Take 3 tablets (7.5 mg total) by mouth daily. 270 tablet 3    pregabalin (LYRICA) 50 MG capsule       telmisartan (MICARDIS) 80 MG tablet Take 1 tablet (80 mg total) by mouth.      traZODone (DESYREL) 100 MG tablet TAKE 1 AND 1/2 TABLETS BY  MOUTH AT NIGHT (Patient taking differently: Take 1 tablet (100 mg total) by mouth nightly.) 135 tablet 3    triamcinolone (KENALOG) 0.1 % ointment Apply twice a day to affected areas when red and itchy. Stop when improved and use a barrier cream. 80 g 1    venlafaxine (EFFEXOR) 100 MG tablet Take 1 tablet (100 mg total) by mouth in the morning. (Patient taking differently: Take 0.5 tablets (50 mg total) by mouth in the morning.) 30 tablet 11    albuterol HFA 90 mcg/actuation inhaler Inhale 2 puffs every six (6) hours as needed for wheezing. (Patient not taking: Reported on 05/13/2022) 8 g 11    atenoloL (TENORMIN) 25 MG tablet Take 1 tablet (25 mg total) by mouth Two (2) times a day. 180 tablet 1    benzonatate (TESSALON PERLES) 100 MG capsule Take 1 capsule (100 mg total) by mouth every six (6) hours as needed for cough. (Patient not taking: Reported on 05/13/2022) 30 capsule 1 brompheniramine-pseudoephedrine-DM 2-30-10 mg/5 mL syrup TAKE 5 ML BY MOUTH EVERY 6 HOURS AS NEEDED (Patient not taking: Reported on 05/13/2022)      hydrOXYzine (ATARAX) 10 MG tablet       meclizine (ANTIVERT) 25 mg tablet Take 1 tablet (25 mg total) by mouth Three (3) times a day as needed. (Patient not taking: Reported on 05/13/2022)       No current facility-administered medications for this visit.       Allergies  Allergies   Allergen Reactions    Vilazodone Swelling    Ace Inhibitors Other (See Comments)     Pt states she can not take ace inhibitors. Pt states she can not remember her reaction.     Bee Pollen Itching     COUGH, WATERY EYES    Penicillin G Rash    Penicillins Rash    Pollen Extracts Itching     COUGHING, WATERY EYES       Social History:   Social History     Tobacco Use    Smoking status: Never    Smokeless tobacco: Never   Vaping Use    Vaping Use: Never used   Substance Use Topics    Alcohol use: Never    Drug use: Never  Comment: No history of IVDU, cocaine, or methamphetamines. No history of anorexigens.       Family History:  Family History   Problem Relation Age of Onset    Breast cancer Mother 31    Cancer Mother     Hypertension Mother     Diabetes Mother     Stomach cancer Father 76        unclear where primary was, died age 39    Cancer Father     Diabetes Sister     Diabetes Sister     Breast cancer Maternal Aunt 71    Cancer Maternal Aunt     Breast cancer Maternal Aunt 51    Cancer Maternal Aunt         unk. primary    Breast cancer Maternal Aunt         died around age 66, unclear age of diagnosis    Stomach cancer Maternal Uncle         unsure primary, died age 96    Stomach cancer Paternal Aunt         unclear where primary was    Cancer Paternal Uncle         back    No Known Problems Maternal Grandmother     Glaucoma Maternal Grandfather     Stroke Maternal Grandfather     No Known Problems Paternal Grandmother     Stroke Paternal Grandfather     No Known Problems Daughter Breast cancer Cousin 46        Died age 63. Earl's daughter.     Breast cancer Cousin 57        Treated with mastectomy. Now 51. Jo's daughter.     No Known Problems Other     ADD / ADHD Neg Hx     Alcohol abuse Neg Hx     Anxiety disorder Neg Hx     Bipolar disorder Neg Hx     Dementia Neg Hx     Depression Neg Hx     Drug abuse Neg Hx     OCD Neg Hx     Paranoid behavior Neg Hx     Physical abuse Neg Hx     Schizophrenia Neg Hx     Seizures Neg Hx     Sexual abuse Neg Hx     Colon cancer Neg Hx     Endometrial cancer Neg Hx     Ovarian cancer Neg Hx     BRCA 1/2 Neg Hx     Melanoma Neg Hx     Basal cell carcinoma Neg Hx     Squamous cell carcinoma Neg Hx        ROS- 12 system review is negative other than what is specified in the History of Present Illness.      Objective:   Physical Exam  Vitals:    05/13/22 1100   BP: 120/73   Pulse: 72   SpO2: 93%   Weight: (!) 112.8 kg (248 lb 9.6 oz)   Height: 172.7 cm (5' 8)     Wt Readings from Last 3 Encounters:   05/13/22 (!) 112.8 kg (248 lb 9.6 oz)   04/20/22 (!) 113.6 kg (250 lb 6.4 oz)   02/08/22 (!) 111.9 kg (246 lb 9.6 oz)     General-  Patient is well-appearing in no acute distress  Neurologic- Alert and oriented X3.  Cranial nerve II-XII grossly intact.  HEENT-  Normocephalic atraumatic head.  No scleral icterus.  Wearing mask  Neck- Supple, no carotid bruis, no JVD  Lungs- clear to auscultation, no wheezes, rhonchi, or rhales.  Heart-  RRR, nl S1S2, no MRG  Extremities-  No clubbing or cyanosis.  No LE edema  Pulses- 2+ pulses in radial and dorsalis pedis bilaterally.  Psych- Normal mood, appropriate.      Laboratory data:      Component Value Date/Time    PROBNP 12.6 02/01/2017 1039    PROBNP 16 01/23/2014 1241       Lab Results   Component Value Date    WBC 12.1 (H) 02/08/2022    HGB 13.4 02/08/2022    HCT 42.2 02/08/2022    PLT 338 02/08/2022       Lab Results   Component Value Date    NA 140 11/04/2021    K 5.5 (H) 11/04/2021    CL 104 11/04/2021    CO2 30.0 11/04/2021    BUN 12 02/08/2022    CREATININE 0.90 (H) 02/08/2022    GLU 86 11/04/2021    CALCIUM 9.4 02/08/2022    MG 2.1 12/31/2013    PHOS 4.3 11/04/2021       Lab Results   Component Value Date    TSH 1.350 04/08/2019    ALBUMIN 3.7 02/08/2022    ALT 14 02/08/2022    AST 21 09/30/2021    INR 1.11 01/03/2014       No results found for: LDL, HDL    Electrocardiogram:  From today shows normal sinus rhythm minimal voltage criteria for LVH    Cardiac Monitor:  Pending    Echocardiogram 08/27/21:   1. The left ventricular systolic function is normal, LVEF is visually  estimated at 60-65%.    2. The right ventricle is normal in size, with normal systolic function.    3. The pulmonary systolic pressure cannot be estimated due to insufficient  TR signal.      Glade Stanford, AGNP-C  Cardiology Nurse Practitioner  [facility]

## 2022-05-15 MED ORDER — CLONAZEPAM 0.5 MG TABLET
ORAL_TABLET | 0 refills | 0 days
Start: 2022-05-15 — End: ?

## 2022-05-16 MED ORDER — CLONAZEPAM 0.5 MG TABLET
ORAL_TABLET | 0 refills | 0 days | Status: CP
Start: 2022-05-16 — End: ?

## 2022-05-17 MED ORDER — CLONAZEPAM 0.5 MG TABLET
ORAL_TABLET | 0 refills | 0 days
Start: 2022-05-17 — End: ?

## 2022-05-17 NOTE — Unmapped (Signed)
[facility] does not have medication in stock.  Request to send Rx to [facility].

## 2022-05-18 NOTE — Unmapped (Signed)
OPENING ERROR 

## 2022-05-19 ENCOUNTER — Encounter: Payer: Self-pay | Admitting: Family Medicine

## 2022-05-19 MED ORDER — CLONAZEPAM 0.5 MG TABLET
ORAL_TABLET | Freq: Once | ORAL | 0 refills | 0.00000 days | Status: CP | PRN
Start: 2022-05-19 — End: 2022-05-19

## 2022-05-23 ENCOUNTER — Telehealth
Admit: 2022-05-23 | Discharge: 2022-05-24 | Payer: MEDICARE | Attending: Student in an Organized Health Care Education/Training Program | Primary: Student in an Organized Health Care Education/Training Program

## 2022-05-23 NOTE — Unmapped (Signed)
[facility]  Psychiatry   Established Patient E&M Service - Outpatient       Assessment:    Caitlin Smith presents for follow-up evaluation. Patient continues to notes poor mood lability and irriability. She reports insomnia and anxiety. Discussed sleep hygiene with patient. Patient expressed understanding. Reviewed medications with patients again. Made plan for monthly meeting. Currently, patient can speak to what is causing her overall anxiety. She is sleeping poorly due to stress    [ ]  Virtual meeting in October   [ ]  No medication changes   [ ]  Can consider increasing Klonopin dose in time being    Identifying Information:    Caitlin Smith is a 66 y.o. female with a history of  MDD, GAD, and Panic Disorder in the context of many chronic medical conditions including chronic pain/fibromyalgia, osteoarthritis with degenerative disc disease, Lupus, pituitary tumor (benign), s/p right total knee arthroplasty, and VIN II (s/p resection), who presents for follow-up evaluation. She has been maintained on Lamictal for many years as well as cymbalta, trazodone, klonopin, and wellbutrin. Patient has previously tolerated a slight taper in her klonopin from 0.75 mg to 0.25 mg qHS overtime. Her mood is often exacerbated by steroid use for current treatment of pulmonary disease. Patient is engaged with CBT therapy at the pain clinic.    Past medications:  Cymbalta    Risk Assessment:  An assessment of suicide and violence risk factors was performed as part of this evaluation and is not significantly changed from the last visit. While future psychiatric events cannot be accurately predicted, the patient does not currently require acute inpatient psychiatric care and does not currently meet [facility] involuntary commitment criteria.      Plan:    Problem: Major depression, Anxiety NOS, Panic Disorder   Status of problem: chronic with mild exacerbation  Interventions:   -- Continue Lamictal 400mg  qhs for mood  -- Stop Effexor 100 mg daily   -- Increase Wellbutrin XL 300 qAM   -- Continue Clonazepam 0.25mg  at bedtime  -- Continue CBT at pain clinic    Problem: Insomnia   Status of problem: chronic and stable  Interventions:   Last sleep study performed in 2017. Per chart review, showed mild OSA likely related to medication use. Neurology recommends patient avoid sleeping on back as she likely has OSA when sleeping in this position  -- Continue melatonin 3-5mg  at bedtime   -- Continue trazodone 150mg  qHS for insomnia (d5/4/21, d 02/05/20, i9/8,)  -- Clonazepam per above    Problem: High Risk Medication monitoring   Status of problem:  Stable   Interventions:   The complexity of this patient's care involves drug therapy which requires intensive monitoring for toxicity. This is in part due to a narrow therapeutic window, as well as potential for toxicity. This patient is being treated with Lamotrigine (Lamictal) to target their psychiatric disorder, Major depressive disorder, refractory. In regards to this medication being considered high risk, Lamotrigine (lamictal) has black box warning for serious rash including fatal reaction of Stevens-Johnson syndrome and rare cases of toxic epidermal necrolysis. In addition to prolonged titration to mitigate risk of serious rash, lamotrigine requires monitoring of hepatic and renal function at baseline, and at times will require monitoring of lamotrigine blood levels.    Problem: Mild Major Neurocognitive Disorder,   Status of problem:  new problem to this provider  Interventions:  Patient seen by Neurology ([facility]) 05/22/18 who dx pt with mild  neurocognitive impairment w/ deficits in inattention and visual-spatial function with suspicion that sleep apnea and polypharmacy may be contributing to her difficulties. Recommended that psychopharm regimen be simplified, including eliminating trazodone and considering changing Wellbutrin to Zoloft or Lexapro for anxiety and eliminating Klonopin when stable.   - However, it is very likely that patient's lupus is significantly contributing to cognitive decline, which will limit improvement she will see with reduction in polypharmacy  -- Last MOCA 22 on 12/12/2017  -- Need to repeat MOCA.     Problem: Polypharmacy   Status of problem:  chronic and stable  Interventions:  -- may be contributing to neurocognitive disorder   -- will consider trazodone and klonopin taper     Lamotrigine Monitoring  - Hepatic function testing, platelets (via CBC) q6 months  - AST/ALT WNL     Effexor   --Impaired renal function can decrease clearance of effexor  -- most recent CrCl 89   -- will continue to monitor along with PCP given dx lupus    Psychotherapy provided:  No billable psychotherapy service provided but brief supportive therapy was utilized.    Patient has been given this writer's contact information as well as the [facility] urgent line number. The patient has been instructed to call 911 for emergencies.    Subjective:    Interval History:   Patient reports generalized anxiety. It  is unclear what exactly the source of anxiety is. Continuing to work with patient with frustration tolerance.   Mood: Stable  Sleep Sleeping poorly. Reports that she works from 11pm- 2am.   Medications:   Lamictal 400mg  at bedtime ---she feels it helpful   Effexor 100mg  daily ---she feels it helpful  Wellbutrin 150mg  daily--tolerating    Clonazepam .25mg  at bedtime.  Trazadone 100mg  at bedtime---encouraged patient to increase dose to 150mg .   Melatonin prn   Safety Concerns/SI/SIB: History of SI---denies SI recently. Continues to be motivated to live for her family.       Objective:    Mental Status Exam:    Appearance:    Appears stated age, Well nourished and Clean/Neat   Motor:    tremulous, appropriate eye contact    Speech/Language:    Normal rate, volume, tone, fluency and Language intact, well formed   Mood:   Anxious   Affect:   Anxious, Cooperative and Full   Thought process and Associations:   Logical, linear, clear, coherent, goal directed   Abnormal/psychotic thought content:     Denies SI, HI, self harm, delusions, obsessions, paranoid ideation, or ideas of reference   Perceptual disturbances:     Denies auditory and visual hallucinations, behavior not concerning for response to internal stimuli     Other:   insight/judgement intact      LAMICTAL MONITORING: Hepatic function testing, platelets (via CBC) at no defined frequency, but should do at baseline and can do periodically  Lamictal Level: No results found for: LAMOTRIGINE     LFTs:   Lab Results   Component Value Date    AST 21 09/30/2021    AST 11 04/29/2021    AST 28 12/29/2020    AST 24 04/08/2019    AST 32 11/05/2014    AST 30 05/08/2014      Lab Results   Component Value Date    ALT 14 02/08/2022    ALT 12 09/30/2021    ALT 14 04/29/2021    ALT 13 04/08/2019    ALT 21 11/05/2014  ALT 31 05/08/2014     Platelets:   Lab Results   Component Value Date    Platelet 338 02/08/2022    Platelet 343 09/30/2021    Platelet 306 08/11/2021    Platelet 349 04/08/2019    Platelet 304 11/05/2014    Platelet 347 05/08/2014     Lab Results   Component Value Date    WBC 12.1 (H) 02/08/2022    HGB 13.4 02/08/2022    HCT 42.2 02/08/2022    PLT 338 02/08/2022     Patient was seen and plan of care was discussed with the Attending MD, Bottom who agrees with the above statement and plan.        Ovid Curd MD MPH  PGY3 Psychiatry          The patient reports they are currently: at home. I spent 30 minutes on the real-time audio and video with the patient on the date of service. I spent an additional 10 minutes on pre- and post-visit activities on the date of service.     The patient was physically located in [place] or a state in which I am permitted to provide care. The patient and/or parent/guardian understood that s/he may incur co-pays and cost sharing, and agreed to the telemedicine visit. The visit was reasonable and appropriate under the circumstances given the patient's presentation at the time.    The patient and/or parent/guardian has been advised of the potential risks and limitations of this mode of treatment (including, but not limited to, the absence of in-person examination) and has agreed to be treated using telemedicine. The patient's/patient's family's questions regarding telemedicine have been answered.     If the visit was completed in an ambulatory setting, the patient and/or parent/guardian has also been advised to contact their provider???s office for worsening conditions, and seek emergency medical treatment and/or call 911 if the patient deems either necessary.

## 2022-05-23 NOTE — Unmapped (Signed)
This was a telepsychiatry encounter. I was immediately available on site, and I reviewed and discussed the case with the resident, but did not see the patient.  I agree with the assessment and plan as documented in the resident's note.     Rielly Corlett S Marlow Hendrie, MD

## 2022-05-24 ENCOUNTER — Encounter: Payer: Medicare Other | Admitting: *Deleted

## 2022-05-24 ENCOUNTER — Telehealth: Admit: 2022-05-24 | Discharge: 2022-05-25 | Payer: MEDICARE | Attending: Psychologist | Primary: Psychologist

## 2022-05-24 DIAGNOSIS — J849 Interstitial pulmonary disease, unspecified: Secondary | ICD-10-CM | POA: Diagnosis not present

## 2022-05-24 DIAGNOSIS — F411 Generalized anxiety disorder: Principal | ICD-10-CM

## 2022-05-24 DIAGNOSIS — F410 Panic disorder [episodic paroxysmal anxiety] without agoraphobia: Principal | ICD-10-CM

## 2022-05-24 DIAGNOSIS — F331 Major depressive disorder, recurrent, moderate: Principal | ICD-10-CM

## 2022-05-24 DIAGNOSIS — F4310 Post-traumatic stress disorder, unspecified: Principal | ICD-10-CM

## 2022-05-24 NOTE — Progress Notes (Signed)
Daily Session Note  Patient Details  Name: Nicole Ballard MRN: [id number] Date of Birth: 03-29-1956 Referring Provider:   Flowsheet Row Pulmonary Rehab from 02/17/2022 in [facility]  Referring Provider Despina Arias MD       Encounter Date: 05/24/2022  Check In:  Session Check In - 05/24/22 0806       Check-In   Supervising physician immediately available to respond to emergencies See telemetry face sheet for immediately available ER MD    Location [facility] Pul[facility]    Staff Present Heath Lark, RN, BSN, CCRP;Jessica Newman, MA, RCEP, CCRP, CCET;Melissa Montpelier, RDN, [employer], BS, Exercise Physiologist    Virtual Visit No    Medication changes reported     No    Fall or balance concerns reported    No    Warm-up and Cool-down Performed on first and last piece of equipment    Resistance Training Performed Yes    VAD Patient? No    PAD/SET Patient? No      Pain Assessment   Currently in Pain? No/denies                Social History   Tobacco Use  Smoking Status Never  Smokeless Tobacco Never    Goals Met:  Proper associated with RPD/PD & O2 Sat Independence with exercise equipment Exercise tolerated well No report of concerns or symptoms today  Goals Unmet:  Not Applicable  Comments: Pt able to follow exercise prescription today without complaint.  Will continue to monitor for progression.    Dr. Emily Filbert is Medical Director for [facility].  Dr. Ottie Glazier is Medical Director for [facility] Pul[facility].

## 2022-05-24 NOTE — Unmapped (Signed)
[facility]   Confidential Psychological Therapy Session      Patient Name: Caitlin Smith  Medical Record Number: [id number]  Date of Service: May 24, 2022  Attending Psychologist: Caroline More, PhD  CPT Procedure Codes: 40102 for 60 mins of face to face counseling    This visit was performed face to face with interactive technology using a HIPPA compliant audio/visual platform. We reviewed confidentiality today. The patient was present in [place], a state in which this provider is licensed and able to provide care (location and contact information confirmed), attended this visit alone, and consented to this virtual pain psychology visit.    REFERRING PHYSICIAN: Clarene [place], MD    CHIEF COMPLAINT AND REASON FOR VISIT: pain coping skills, CBT to address depression and anxiety in the setting of chronic pain    SUBJECTIVE / HISTORY OF PRESENT ILLNESS: [name] is a very pleasant 66 y.o.  female from [place], [place] with multiple chronic pain complaints related to fibromyalgia and lupus who initially met with me in October 2016, and which time she was diagnosed with severe depression, PTSD, panic disorder, and generalized anxiety.The patient returns for a therapy session today. Last follow up with me was 03/04/22.     Patient reports mood to be fair, worse, due to breathing and coughing concerns. Saw her pulmonologist earlier today and continues on Flovent and albuterol as well as oral steroids but is coughing and wheezing more. Is having difficulty with bladder inconstancy because coughing has been so bad it's affected the bladder issues. Pt also has difficulty with talking. Has been referred to ENT but appointment isn't until Oct. Pt also didn't have her clonazepam because of an issue with it being sent to the wrong pharmacy - patient upset about it - finally got it filled Friday. Pt notes her anxiety was worse and she was worried about having panic attacks. Patient continues pulm rehab and her O2 is staying up and is monitored which is reassuring. Discussed possibility of trying breathing strategies to help with her cough/vocal issues (is hoarse). Spent extra time practicing diaphragmatic breathing and breathing through a straw to try to improve the possible voicebox inflammation. Pt to message pulmonologist directly after appointment today and to see urgent care or be seen at ED if O2 drops.     OBJECTIVE / MENTAL STATUS:    Appearance:   Appears stated age and Clean/Neat   Motor:  No abnormal movements   Speech/Language:   Normal rate, volume, tone, fluency   Mood:  Depressed and Anxious   Affect:  euthymic   Thought process:  Logical, linear, clear, coherent, goal directed   Thought content:    Denies SI, HI, self harm, delusions, obsessions, paranoid ideation, or ideas of reference   Perceptual disturbances:    Denies auditory and visual hallucinations, behavior not concerning for response to internal stimuli   Orientation:  Oriented to person, place, time, and general circumstances   Attention:  Able to fully attend without fluctuations in consciousness   Concentration:  Able to fully concentrate and attend   Memory:  Immediate, short-term, long-term, and recall grossly intact    Fund of knowledge:   Consistent with level of education and development   Insight:    Fair   Judgment:   Intact   Impulse Control:  Intact     DIAGNOSTIC IMPRESSION:   Post Traumatic Stress Disorder (PTSD)  Generalized Anxiety Disorder (GAD)  Panic Disorder  Major  Depressive Disorder, moderate, recurrent  Chronic pain syndrome  Fibromyalgia  Lupus    ASSESSMENT:   [name] is a very pleasant 66 y.o.  female from [place], [place] with multiple chronic pain complaints related to lupus, arthritis, and fibromyalgia. She was previously seen in our clinic from 2012-2014, and reestablished care with Dr. Fayrene Fearing in August 2016. The patient has struggled with depression and anxiety, but is very motivated to participate in intensive, multidisciplinary treatment to address the connection between depression, anxiety and pain. She has a long-standing history of depression and anxiety, and has worked with outpatient psychiatrist and therapist over the years. She is currently established with [facility] psychiatry.  In general, depression, anxiety, pain coping and panic have all improved (until recent bout with pulmonary issues), but the patient evidences a seasonal affective pattern of mood changes typically her mood tends to be worse during the winter.      PLAN:   (1) Psychotherapy - Continue CBT. CBT will be used to address PTSD, MDD, Panic D/o, GAD, and chronic pain.     --Behavioral Activation - Encouraged behavioral activation.  Is trying.  --Downloaded and uses Insight Timer, guided relaxation app, for nightly practice.  --Continues to utilize diaphragmatic breathing daily  --encouraged early morning sunlight / light therapy for possible SAD in the setting of chronic MDD    (2) Psychiatry - Pt currently established with [facility]iat[facility]. Has established with a new resident and is followed by Dr. Evette Cristal (attending)    (3) Safety - Pt denies any current or recent SI or safety concerns. Knows to call 911 or go to her local ED. Also previously given [facility].      (4) follow-up - with me in 1 month

## 2022-05-26 ENCOUNTER — Encounter: Payer: Medicare Other | Admitting: *Deleted

## 2022-05-26 DIAGNOSIS — J849 Interstitial pulmonary disease, unspecified: Secondary | ICD-10-CM

## 2022-05-26 DIAGNOSIS — F411 Generalized anxiety disorder: Principal | ICD-10-CM

## 2022-05-26 DIAGNOSIS — F32A Depressive disorder: Principal | ICD-10-CM

## 2022-05-26 MED ORDER — LAMOTRIGINE 200 MG TABLET
ORAL_TABLET | 2 refills | 0 days
Start: 2022-05-26 — End: ?

## 2022-05-26 NOTE — Progress Notes (Signed)
Daily Session Note  Patient Details  Name: Nicole Ballard MRN: [id number] Date of Birth: 1956-02-14 Referring Provider:   Flowsheet Row Pulmonary Rehab from 02/17/2022 in [facility]  Referring Provider Despina Arias MD       Encounter Date: 05/26/2022  Check In:  Session Check In - 05/26/22 1355       Check-In   Supervising physician immediately available to respond to emergencies See telemetry face sheet for immediately available ER MD    Location [facility]-Cardiac & Pulmonary Rehab    Staff Present Alberteen Sam, MA, RCEP, CCRP, CCET;Joseph Cleone, RCP,RRT,BSRT;Other   Darlyne Russian, RN   Virtual Visit No    Medication changes reported     Yes    Comments pt states atenolol incr to 25 mg bid, med list updated    Fall or balance concerns reported    No    Tobacco Cessation No Change    Warm-up and Cool-down Performed on first and last piece of equipment    Resistance Training Performed Yes    VAD Patient? No    PAD/SET Patient? No      Pain Assessment   Currently in Pain? No/denies                Social History   Tobacco Use  Smoking Status Never  Smokeless Tobacco Never    Goals Met:  Proper associated with RPD/PD & O2 Sat Independence with exercise equipment Exercise tolerated well No report of concerns or symptoms today Strength training completed today  Goals Unmet:  Not Applicable  Comments: Pt able to follow exercise prescription today without complaint.  Will continue to monitor for progression.    Dr. Emily Filbert is Medical Director for [place].  Dr. Ottie Glazier is Medical Director for [facility] Pulmonary Rehabilitation.

## 2022-05-29 MED ORDER — LAMOTRIGINE 200 MG TABLET
ORAL_TABLET | 2 refills | 0 days | Status: CP
Start: 2022-05-29 — End: ?

## 2022-05-31 ENCOUNTER — Encounter: Payer: Medicare Other | Admitting: *Deleted

## 2022-05-31 DIAGNOSIS — J849 Interstitial pulmonary disease, unspecified: Secondary | ICD-10-CM | POA: Diagnosis not present

## 2022-05-31 NOTE — Progress Notes (Signed)
Daily Session Note  Patient Details  Name: Nicole Ballard MRN: [id number] Date of Birth: [birth year] Referring Provider:   Flowsheet Row Pulmonary Rehab from 02/17/2022 in [facility] Cardiac and Pulmonary Rehab  Referring Provider Despina Arias MD       Encounter Date: 05/31/2022  Check In:  Session Check In - 05/31/22 0835       Check-In   Supervising physician immediately available to respond to emergencies See telemetry face sheet for immediately available ER MD    Location [facility]-Cardiac & Pulmonary Rehab    Staff Present Heath Lark, RN, BSN, CCRP;Jessica Winfred, MA, RCEP, CCRP, CCET;Noah Tickle, BS, Exercise Physiologist    Virtual Visit No    Medication changes reported     No    Fall or balance concerns reported    No    Warm-up and Cool-down Performed on first and last piece of equipment    Resistance Training Performed Yes    VAD Patient? No    PAD/SET Patient? No      Pain Assessment   Currently in Pain? No/denies                Social History   Tobacco Use  Smoking Status Never  Smokeless Tobacco Never    Goals Met:  Proper associated with RPD/PD & O2 Sat Independence with exercise equipment Exercise tolerated well No report of concerns or symptoms today  Goals Unmet:  Not Applicable  Comments: Pt able to follow exercise prescription today without complaint.  Will continue to monitor for progression.    Dr. Emily Filbert is Medical Director for [place].  Dr. Ottie Glazier is Medical Director for [facility] Pulmonary Rehabilitation.

## 2022-06-02 MED ORDER — OMEPRAZOLE 40 MG CAPSULE,DELAYED RELEASE
ORAL_CAPSULE | Freq: Two times a day (BID) | ORAL | 3 refills | 90 days | Status: CP
Start: 2022-06-02 — End: ?

## 2022-06-03 ENCOUNTER — Ambulatory Visit: Payer: Medicare Other

## 2022-06-03 DIAGNOSIS — Z5181 Encounter for therapeutic drug level monitoring: Principal | ICD-10-CM

## 2022-06-03 NOTE — Progress Notes (Signed)
Patient came for a pulse ox check per her Pulmonologist's recommendations. She is unsure why- possibly due to previous SOB. She had normal respirations and pulse oxygen is normal. No breathing problems during visit.

## 2022-06-03 NOTE — Progress Notes (Unsigned)
Name: Nicole Ballard   MRN: [id number]    DOB: 1956-07-08   Date:06/03/2022       Progress Note  Subjective  Chief Complaint  Discuss MRI  HPI  *** Patient Active Problem List   Diagnosis Date Noted   Immunocompromised ([place]) 04/29/2021   Hyperglycemia 10/23/2017   Vertigo 09/29/2016   Bronchiolitis obliterans organizing pneumonia ([place]) 09/29/2016   Neck pain, musculoskeletal 12/15/2015   Abnormal CAT scan 04/11/2015   Auditory impairment 04/11/2015   Insomnia, persistent 04/11/2015   Chronic kidney disease (CKD), stage I 04/11/2015   Chronic nonmalignant pain 04/11/2015   Chronic constipation 04/11/2015   Dyslipidemia 04/11/2015   Fibromyalgia 04/11/2015   Gastro-esophageal reflux disease without esophagitis 04/11/2015   Dysphonia 04/11/2015   Low back pain 04/11/2015   Eczema intertrigo 04/11/2015   Keratosis pilaris 04/11/2015   Chronic recurrent major depressive disorder ([place]) 41/28/7867   Dysmetabolic syndrome 67/20/9470   Neurogenic claudication 04/11/2015   Perennial allergic rhinitis with seasonal variation 04/11/2015   Abnormal presence of protein in urine 04/11/2015   Seborrhea capitis 04/11/2015   Moderate dysplasia of vulva 04/11/2015   Vitamin D deficiency 04/11/2015   Spinal stenosis of lumbar region 04/11/2015   Treadmill stress test negative for angina pectoris 03/05/2015   Shortness of breath 05/20/2014   Asthma, chronic 12/31/2013   H/O total knee replacement 12/31/2013   Episcleritis 09/26/2013   Vocal cord dysfunction 05/28/2013   Anxiety, generalized 03/19/2013   Back pain 12/18/2012   Pulmonary nodules 11/26/2012   Lung nodule, multiple 11/26/2012   Arthritis, degenerative 11/01/2012   H/O aspiration pneumonitis 10/22/2012   Postinflammatory pulmonary fibrosis ([place]) 10/22/2012   Mixed incontinence 05/04/2012   Lung involvement in systemic lupus erythematosus ([place]) 11/05/2010   Chronic nausea 07/01/2008   Benign hypertension 01/25/2005     Past Surgical History:  Procedure Laterality Date   BRAIN SURGERY     CHOLECYSTECTOMY     VAGINAL HYSTERECTOMY      Family History  Problem Relation Age of Onset   Breast cancer Mother    Cancer Mother 34       Breast   Cancer Father 28       Stomach   Breast cancer Maternal Aunt     Social History   Tobacco Use   Smoking status: Never   Smokeless tobacco: Never  Substance Use Topics   Alcohol use: No    Alcohol/week: 0.0 standard drinks of alcohol     Current Outpatient Medications:    albuterol (VENTOLIN HFA) 108 (90 Base) MCG/ACT inhaler, SMARTSIG:2 Puff(s) By Mouth Every 6 Hours PRN, Disp: , Rfl:    amLODipine (NORVASC) 2.5 MG tablet, TAKE 3 TABLETS BY MOUTH DAILY, Disp: 270 tablet, Rfl: 3   aspirin EC 81 MG tablet, Take 1 tablet (81 mg total) by mouth daily., Disp: 30 tablet, Rfl: 0   atenolol (TENORMIN) 25 MG tablet, Take 1 tablet by mouth in the morning and at bedtime., Disp: , Rfl:    atorvastatin (LIPITOR) 40 MG tablet, TAKE 1 TABLET BY MOUTH  DAILY, Disp: 90 tablet, Rfl: 3   Biotin 1 MG CAPS, Take 1 capsule by mouth daily., Disp: 30 capsule, Rfl: 0   buPROPion (WELLBUTRIN XL) 300 MG 24 hr tablet, Take 300 mg by mouth in the morning., Disp: , Rfl:    Cholecalciferol (VITAMIN D) 2000 UNITS CAPS, Take 1 capsule by mouth daily., Disp: , Rfl:    clobetasol ointment (TEMOVATE) 9.62 %, Apply 1 application topically  2 (two) times daily., Disp: , Rfl:    clonazePAM (KLONOPIN) 0.5 MG tablet, Take 0.25 mg by mouth at bedtime., Disp: , Rfl:    clotrimazole-betamethasone (LOTRISONE) cream, Apply 1 application topically 2 (two) times daily., Disp: 45 g, Rfl: 0   diclofenac Sodium (VOLTAREN) 1 % GEL, Apply topically daily as needed., Disp: , Rfl:    famotidine (PEPCID) 20 MG tablet, Take by mouth., Disp: , Rfl:    fluticasone (FLONASE) 50 MCG/ACT nasal spray, Place 2 sprays into both nostrils daily., Disp: 48 g, Rfl: 3   fluticasone (FLOVENT HFA) 220 MCG/ACT inhaler,  Inhale into the lungs., Disp: , Rfl:    gabapentin (NEURONTIN) 300 MG capsule, Take 300 mg (1 capsule) in the morning and at 300 mg (1 capsule) at noon and 600 mg (2 capsules) at night., Disp: , Rfl:    hydrALAZINE (APRESOLINE) 10 MG tablet, TAKE 1 TABLET BY MOUTH THREE TIMES DAILY AS NEEDED FOR BLOOD PRESSURE ABOVE 150/90, Disp: 90 tablet, Rfl: 2   hydroxychloroquine (PLAQUENIL) 200 MG tablet, Take by mouth 2 (two) times daily., Disp: , Rfl:    hydrOXYzine (ATARAX) 10 MG tablet, Take 1 tablet (10 mg total) by mouth 3 (three) times daily as needed., Disp: 30 tablet, Rfl: 0   lamoTRIgine (LAMICTAL) 200 MG tablet, Take 400 mg by mouth at bedtime., Disp: , Rfl:    LINZESS 145 MCG CAPS capsule, Take 145 mcg by mouth daily., Disp: , Rfl:    loratadine (CLARITIN) 10 MG tablet, Take 1 tablet (10 mg total) by mouth daily., Disp: 90 tablet, Rfl: 1   Melatonin 10 MG TABS, Take 1 tablet by mouth at bedtime., Disp: , Rfl:    metFORMIN (GLUCOPHAGE-XR) 500 MG 24 hr tablet, Take 1 tablet (500 mg total) by mouth daily with breakfast., Disp: 90 tablet, Rfl: 1   metoprolol succinate (TOPROL-XL) 25 MG 24 hr tablet, Take 1 tablet (25 mg total) by mouth daily. In place of Atenolol, Disp: 30 tablet, Rfl: 0   mycophenolate (MYFORTIC) 360 MG TBEC EC tablet, Take 720 mg by mouth 2 (two) times daily., Disp: , Rfl:    olopatadine (PATANOL) 0.1 % ophthalmic solution, Place 1 drop into both eyes 2 (two) times daily., Disp: 5 mL, Rfl: 1   omeprazole (PRILOSEC) 40 MG capsule, TAKE 1 CAPSULE BY MOUTH  DAILY, Disp: 90 capsule, Rfl: 3   Polyethylene Glycol 3350 (MIRALAX PO), Take by mouth., Disp: , Rfl:    predniSONE (DELTASONE) 10 MG tablet, Take by mouth., Disp: , Rfl:    telmisartan (MICARDIS) 80 MG tablet, TAKE 1 TABLET BY MOUTH DAILY, Disp: 90 tablet, Rfl: 3   traZODone (DESYREL) 100 MG tablet, Take 150 mg by mouth at bedtime., Disp: , Rfl:    triamcinolone ointment (KENALOG) 0.1 %, , Disp: , Rfl:    venlafaxine (EFFEXOR) 100  MG tablet, Take 100 mg by mouth every evening., Disp: , Rfl:   Allergies  Allergen Reactions   Ace Inhibitors     Other reaction(s): OTHER Pt states she can not take ace inhibitors. Pt states she can not remember her reaction.    Bee Pollen    Penicillins    Pollen Extract     I personally reviewed active problem list, medication list, allergies, family history, social history, health maintenance with the patient/caregiver today.   ROS  ***  Objective  There were no vitals filed for this visit.  There is no height or weight on file to calculate BMI.  Physical Exam ***   PHQ2/9:    04/15/2022    9:23 AM 04/12/2022    7:37 AM 03/15/2022    8:14 AM 02/17/2022   11:16 AM 12/20/2021    7:46 AM  Depression screen PHQ 2/9  Decreased Interest '3 1 1 1 '$ 0  Down, Depressed, Hopeless '3 1 1 1 '$ 0  PHQ - 2 Score '6 2 2 2 '$ 0  Altered sleeping 1 0 2 2 0  Tired, decreased energy '1 2 1 1 '$ 0  Change in appetite 1 3 0 0 0  Feeling bad or failure about yourself  '1 1 1 1 3  '$ Trouble concentrating 1 1 0 2 0  Moving slowly or fidgety/restless 0 '1 1 1 '$ 0  Suicidal thoughts 0 0 0 0 0  PHQ-9 Score '11 10 7 9 3  '$ Difficult doing work/chores  Very difficult Somewhat difficult Somewhat difficult     phq 9 is {gen pos [id number]}   Fall Risk:    04/15/2022    9:19 AM 02/02/2022    2:12 PM 12/20/2021    7:46 AM 10/01/2021    9:46 AM 09/07/2021   11:13 AM  Fall Risk   Falls in the past year? 0 1 0 0 0  Number falls in past yr: 0 0 0 0 0  Injury with Fall? 0 0 0 0 0  Comment  walking shoe hit cement and tripped me to a fall     Risk for fall due to : No Fall Risks Medication side effect No Fall Risks No Fall Risks No Fall Risks  Follow up Falls prevention discussed Falls prevention discussed;Education provided Falls prevention discussed Falls prevention discussed Falls prevention discussed      Functional Status Survey:      Assessment & Plan  *** There are no diagnoses linked to this  encounter.

## 2022-06-03 NOTE — Unmapped (Signed)
Called patient to clarify earlier messages.    With exertion oxygen drops to between 83-86%. Improves without oxygen while continuing to exercise. No home pulse oximeter.     Still having cough. With exertion does have dyspnea, but will improve with continued exertion. Sometimes gets dyspnea just when sitting still. Variable day to day without clear trigger.    No fevers, chills, excessive sweating, other infectious symptoms.    Plan:  - Patient to get home pulse oximeter  - CMP and CBC with diff at her convenience for MMF monitoring  - Reviewed ED indications.    Layla Maw MD  Pulmonary and Critical Care Fellow

## 2022-06-06 ENCOUNTER — Encounter: Payer: Self-pay | Admitting: Family Medicine

## 2022-06-06 ENCOUNTER — Ambulatory Visit (INDEPENDENT_AMBULATORY_CARE_PROVIDER_SITE_OTHER): Payer: Medicare Other | Admitting: Family Medicine

## 2022-06-06 VITALS — BP 132/70 | HR 77 | Resp 16 | Ht 67.0 in | Wt 245.0 lb

## 2022-06-06 DIAGNOSIS — R519 Headache, unspecified: Secondary | ICD-10-CM

## 2022-06-06 DIAGNOSIS — Z23 Encounter for immunization: Secondary | ICD-10-CM | POA: Diagnosis not present

## 2022-06-06 DIAGNOSIS — R110 Nausea: Secondary | ICD-10-CM | POA: Diagnosis not present

## 2022-06-06 DIAGNOSIS — G8929 Other chronic pain: Secondary | ICD-10-CM

## 2022-06-06 DIAGNOSIS — G245 Blepharospasm: Secondary | ICD-10-CM | POA: Diagnosis not present

## 2022-06-06 DIAGNOSIS — Z9889 Other specified postprocedural states: Secondary | ICD-10-CM

## 2022-06-06 MED ORDER — ONDANSETRON HCL 4 MG PO TABS: 4.0000 mg | ORAL_TABLET | Freq: Three times a day (TID) | ORAL | 0 refills | Status: DC | PRN

## 2022-06-07 ENCOUNTER — Encounter: Payer: Medicare Other | Attending: Critical Care Medicine | Admitting: *Deleted

## 2022-06-07 DIAGNOSIS — J849 Interstitial pulmonary disease, unspecified: Secondary | ICD-10-CM | POA: Insufficient documentation

## 2022-06-07 NOTE — Progress Notes (Signed)
Daily Session Note  Patient Details  Name: Nicole Ballard MRN: [id number] Date of Birth: Jan 09, 1956 Referring Provider:   Flowsheet Row Pulmonary Rehab from 02/17/2022 in [facility] Cardiac and Pulmonary Rehab  Referring Provider Despina Arias MD       Encounter Date: 06/07/2022  Check In:  Session Check In - 06/07/22 0843       Check-In   Supervising physician immediately available to respond to emergencies See telemetry face sheet for immediately available ER MD    Location [facility]-Cardiac & Pulmonary Rehab    Staff Present Heath Lark, RN, BSN, CCRP;Jessica [place], MA, RCEP, CCRP, Marylynn Pearson, MS, ASCM CEP, Exercise Physiologist    Virtual Visit No    Medication changes reported     No    Fall or balance concerns reported    No    Warm-up and Cool-down Performed on first and last piece of equipment    Resistance Training Performed Yes    VAD Patient? No    PAD/SET Patient? No      Pain Assessment   Currently in Pain? No/denies                Social History   Tobacco Use  Smoking Status Never  Smokeless Tobacco Never    Goals Met:  Proper associated with RPD/PD & O2 Sat Independence with exercise equipment Exercise tolerated well No report of concerns or symptoms today  Goals Unmet:  Not Applicable  Comments: Pt able to follow exercise prescription today without complaint.  Will continue to monitor for progression.    Dr. Emily Filbert is Medical Director for [place].  Dr. Ottie Glazier is Medical Director for [facility] Pulmonary Rehabilitation.

## 2022-06-08 ENCOUNTER — Encounter: Payer: Self-pay | Admitting: *Deleted

## 2022-06-08 ENCOUNTER — Ambulatory Visit: Admit: 2022-06-08 | Discharge: 2022-06-09 | Payer: MEDICARE

## 2022-06-08 DIAGNOSIS — J849 Interstitial pulmonary disease, unspecified: Secondary | ICD-10-CM

## 2022-06-08 LAB — COMPREHENSIVE METABOLIC PANEL
ALBUMIN: 3.6 g/dL (ref 3.4–5.0)
ALKALINE PHOSPHATASE: 81 U/L (ref 46–116)
ALT (SGPT): 19 U/L (ref 10–49)
ANION GAP: 4 mmol/L — ABNORMAL LOW (ref 5–14)
AST (SGOT): 22 U/L (ref <=34)
BILIRUBIN TOTAL: 0.3 mg/dL (ref 0.3–1.2)
BLOOD UREA NITROGEN: 12 mg/dL (ref 9–23)
BUN / CREAT RATIO: 12
CALCIUM: 9.5 mg/dL (ref 8.7–10.4)
CHLORIDE: 107 mmol/L (ref 98–107)
CO2: 30.2 mmol/L (ref 20.0–31.0)
CREATININE: 1.04 mg/dL — ABNORMAL HIGH
EGFR CKD-EPI (2021) FEMALE: 60 mL/min/{1.73_m2} (ref >=60)
GLUCOSE RANDOM: 99 mg/dL (ref 70–179)
POTASSIUM: 4.6 mmol/L (ref 3.4–4.8)
PROTEIN TOTAL: 7.5 g/dL (ref 5.7–8.2)
SODIUM: 141 mmol/L (ref 135–145)

## 2022-06-08 LAB — CBC W/ AUTO DIFF
BASOPHILS ABSOLUTE COUNT: 0.1 10*9/L (ref 0.0–0.1)
BASOPHILS RELATIVE PERCENT: 1.8 %
EOSINOPHILS ABSOLUTE COUNT: 0.1 10*9/L (ref 0.0–0.5)
EOSINOPHILS RELATIVE PERCENT: 1.1 %
HEMATOCRIT: 41.5 % (ref 34.0–44.0)
HEMOGLOBIN: 13.5 g/dL (ref 11.3–14.9)
LYMPHOCYTES ABSOLUTE COUNT: 1.6 10*9/L (ref 1.1–3.6)
LYMPHOCYTES RELATIVE PERCENT: 29.7 %
MEAN CORPUSCULAR HEMOGLOBIN CONC: 32.4 g/dL (ref 32.0–36.0)
MEAN CORPUSCULAR HEMOGLOBIN: 27.4 pg (ref 25.9–32.4)
MEAN CORPUSCULAR VOLUME: 84.4 fL (ref 77.6–95.7)
MEAN PLATELET VOLUME: 7.2 fL (ref 6.8–10.7)
MONOCYTES ABSOLUTE COUNT: 0.6 10*9/L (ref 0.3–0.8)
MONOCYTES RELATIVE PERCENT: 10.8 %
NEUTROPHILS ABSOLUTE COUNT: 3.1 10*9/L (ref 1.8–7.8)
NEUTROPHILS RELATIVE PERCENT: 56.6 %
NUCLEATED RED BLOOD CELLS: 0 /100{WBCs} (ref <=4)
PLATELET COUNT: 351 10*9/L (ref 150–450)
RED BLOOD CELL COUNT: 4.92 10*12/L (ref 3.95–5.13)
RED CELL DISTRIBUTION WIDTH: 14.1 % (ref 12.2–15.2)
WBC ADJUSTED: 5.5 10*9/L (ref 3.6–11.2)

## 2022-06-08 NOTE — Progress Notes (Signed)
Pulmonary Individual Treatment Plan  Patient Details  Name: Nicole Ballard MRN: [id number] Date of Birth: Jan 10, 1956 Referring Provider:   Flowsheet Row Pulmonary Rehab from 02/17/2022 in [facility] Cardiac and Pulmonary Rehab  Referring Provider Despina Arias MD       Initial Encounter Date:  Flowsheet Row Pulmonary Rehab from 02/17/2022 in [facility] Cardiac and Pulmonary Rehab  Date 02/17/22       Visit Diagnosis: ILD (interstitial lung disease) ([place])  Patient's Home Medications on Admission:  Current Outpatient Medications:    albuterol (VENTOLIN HFA) 108 (90 Base) MCG/ACT inhaler, SMARTSIG:2 Puff(s) By Mouth Every 6 Hours PRN, Disp: , Rfl:    amLODipine (NORVASC) 2.5 MG tablet, TAKE 3 TABLETS BY MOUTH DAILY, Disp: 270 tablet, Rfl: 3   aspirin EC 81 MG tablet, Take 1 tablet (81 mg total) by mouth daily., Disp: 30 tablet, Rfl: 0   atenolol (TENORMIN) 25 MG tablet, Take 1 tablet by mouth in the morning and at bedtime., Disp: , Rfl:    atorvastatin (LIPITOR) 40 MG tablet, TAKE 1 TABLET BY MOUTH  DAILY, Disp: 90 tablet, Rfl: 3   Biotin 1 MG CAPS, Take 1 capsule by mouth daily., Disp: 30 capsule, Rfl: 0   buPROPion (WELLBUTRIN XL) 300 MG 24 hr tablet, Take 300 mg by mouth in the morning., Disp: , Rfl:    Cholecalciferol (VITAMIN D) 2000 UNITS CAPS, Take 1 capsule by mouth daily., Disp: , Rfl:    clobetasol ointment (TEMOVATE) 5.03 %, Apply 1 application topically 2 (two) times daily., Disp: , Rfl:    clonazePAM (KLONOPIN) 0.5 MG tablet, Take 0.25 mg by mouth at bedtime., Disp: , Rfl:    clotrimazole-betamethasone (LOTRISONE) cream, Apply 1 application topically 2 (two) times daily., Disp: 45 g, Rfl: 0   diclofenac Sodium (VOLTAREN) 1 % GEL, Apply topically daily as needed., Disp: , Rfl:    famotidine (PEPCID) 20 MG tablet, Take by mouth., Disp: , Rfl:    fluticasone (FLONASE) 50 MCG/ACT nasal spray, Place 2 sprays into both nostrils daily., Disp: 48 g, Rfl: 3   fluticasone (FLOVENT HFA)  220 MCG/ACT inhaler, Inhale into the lungs., Disp: , Rfl:    gabapentin (NEURONTIN) 300 MG capsule, Take 300 mg (1 capsule) in the morning and at 300 mg (1 capsule) at noon and 600 mg (2 capsules) at night., Disp: , Rfl:    hydroxychloroquine (PLAQUENIL) 200 MG tablet, Take by mouth 2 (two) times daily., Disp: , Rfl:    hydrOXYzine (ATARAX) 10 MG tablet, Take 1 tablet (10 mg total) by mouth 3 (three) times daily as needed., Disp: 30 tablet, Rfl: 0   lamoTRIgine (LAMICTAL) 200 MG tablet, Take 400 mg by mouth at bedtime., Disp: , Rfl:    LINZESS 145 MCG CAPS capsule, Take 145 mcg by mouth daily., Disp: , Rfl:    loratadine (CLARITIN) 10 MG tablet, Take 1 tablet (10 mg total) by mouth daily., Disp: 90 tablet, Rfl: 1   Melatonin 10 MG TABS, Take 1 tablet by mouth at bedtime., Disp: , Rfl:    metFORMIN (GLUCOPHAGE-XR) 500 MG 24 hr tablet, Take 1 tablet (500 mg total) by mouth daily with breakfast., Disp: 90 tablet, Rfl: 1   metoprolol succinate (TOPROL-XL) 25 MG 24 hr tablet, Take 1 tablet (25 mg total) by mouth daily. In place of Atenolol, Disp: 30 tablet, Rfl: 0   mycophenolate (MYFORTIC) 360 MG TBEC EC tablet, Take 720 mg by mouth 2 (two) times daily., Disp: , Rfl:    olopatadine (  PATANOL) 0.1 % ophthalmic solution, Place 1 drop into both eyes 2 (two) times daily., Disp: 5 mL, Rfl: 1   omeprazole (PRILOSEC) 40 MG capsule, Take 1 capsule by mouth daily at 12 noon., Disp: , Rfl:    ondansetron (ZOFRAN) 4 MG tablet, Take 1 tablet (4 mg total) by mouth every 8 (eight) hours as needed for nausea or vomiting., Disp: 20 tablet, Rfl: 0   Polyethylene Glycol 3350 (MIRALAX PO), Take by mouth., Disp: , Rfl:    predniSONE (DELTASONE) 2.5 MG tablet, Take 1 tablet by mouth daily at 12 noon., Disp: , Rfl:    telmisartan (MICARDIS) 80 MG tablet, TAKE 1 TABLET BY MOUTH DAILY, Disp: 90 tablet, Rfl: 3   traZODone (DESYREL) 100 MG tablet, Take 150 mg by mouth at bedtime., Disp: , Rfl:    triamcinolone ointment (KENALOG)  0.1 %, , Disp: , Rfl:    venlafaxine (EFFEXOR) 100 MG tablet, Take 100 mg by mouth every evening., Disp: , Rfl:   Past Medical History: Past Medical History:  Diagnosis Date   Anxiety    Bipolar 1 disorder ([place])    Chronic kidney disease    Chronic pain    Diabetes mellitus without complication (HCC)    Fibromyalgia    GERD (gastroesophageal reflux disease)    Hearing loss of right ear    Hemifacial spasm    Hyperlipidemia    Hypertension    Insomnia    Lumbago    Osteoarthritis    Paresthesia    Rhinitis    Systemic lupus erythematosus ([place])    Vitamin D deficiency     Tobacco Use: Social History   Tobacco Use  Smoking Status Never  Smokeless Tobacco Never    Labs: Review Flowsheet  More data exists      Latest Ref Rng & Units 10/09/2019 02/14/2020 06/15/2020 12/14/2020 06/15/2021  Labs for ITP Cardiac and Pulmonary Rehab  Cholestrol <200 mg/dL - - 151  - 154   LDL (calc) mg/dL (calc) - - 74  - 75   HDL-C > OR = 50 mg/dL - - 61  - 62   Trlycerides <150 mg/dL - - 78  - 89   Hemoglobin A1c <5.7 % of total Hgb 5.5  5.6  5.5  5.7  5.5      Pulmonary Assessment Scores:  Pulmonary Assessment Scores     Row Name 02/17/22 1115         ADL UCSD   ADL Phase Entry     SOB Score total 43     Rest 1     Walk 2     Stairs 3     Bath 1     Dress 3     Shop 2       CAT Score   CAT Score 26       mMRC Score   mMRC Score 2              UCSD: Self-administered rating of dyspnea associated with activities of daily living (ADLs) 6-point scale (0 = "not at all" to 5 = "maximal or unable to do because of breathlessness")  Scoring Scores range from 0 to 120.  Minimally important difference is 5 units  CAT: CAT can identify the health impairment of COPD patients and is better correlated with disease progression.  CAT has a scoring range of zero to 40. The CAT score is classified into four groups of low (less than 10), medium (10 -  20), high (21-30) and very high  (31-40) based on the impact level of disease on health status. A CAT score over 10 suggests significant symptoms.  A worsening CAT score could be explained by an exacerbation, poor medication adherence, poor inhaler technique, or progression of COPD or comorbid conditions.  CAT MCID is 2 points  mMRC: mMRC (Modified Medical Research Council) Dyspnea Scale is used to assess the degree of baseline functional disability in patients of respiratory disease due to dyspnea. No minimal important difference is established. A decrease in score of 1 point or greater is considered a positive change.   Pulmonary Function Assessment:  Pulmonary Function Assessment - 02/17/22 1115       Breath   Shortness of Breath Yes;Panic with Shortness of Breath;Fear of Shortness of Breath;Limiting activity             Exercise Target Goals: Exercise Program Goal: Individual exercise prescription set using results from initial 6 min walk test and THRR while considering  patient's activity barriers and safety.   Exercise Prescription Goal: Initial exercise prescription builds to 30-45 minutes a day of aerobic activity, 2-3 days per week.  Home exercise guidelines will be given to patient during program as part of exercise prescription that the participant will acknowledge.  Education: Aerobic Exercise: - Group verbal and visual presentation on the components of exercise prescription. Introduces F.I.T.T principle from ACSM for exercise prescriptions.  Reviews F.I.T.T. principles of aerobic exercise including progression. Written material given at graduation. Flowsheet Row Pulmonary Rehab from 04/21/2022 in [facility] Cardiac and Pulmonary Rehab  Date 03/10/22  Educator [facility]  Instruction Review Code 1- Verbalizes Understanding       Education: Resistance Exercise: - Group verbal and visual presentation on the components of exercise prescription. Introduces F.I.T.T principle from ACSM for exercise prescriptions   Reviews F.I.T.T. principles of resistance exercise including progression. Written material given at graduation.    Education: Exercise & Equipment Safety: - Individual verbal instruction and demonstration of equipment use and safety with use of the equipment. Flowsheet Row Pulmonary Rehab from 04/21/2022 in [facility] Cardiac and Pulmonary Rehab  Date 02/17/22  Educator [facility]  Instruction Review Code 1- Verbalizes Understanding       Education: Exercise Physiology & General Exercise Guidelines: - Group verbal and written instruction with models to review the exercise physiology of the cardiovascular system and associated critical values. Provides general exercise guidelines with specific guidelines to those with heart or lung disease.  Flowsheet Row Pulmonary Rehab from 04/21/2022 in [facility] Cardiac and Pulmonary Rehab  Date 03/03/22  Educator NT  Instruction Review Code 1- [employer] Understanding       Education: Flexibility, Balance, Mind/Body Relaxation: - Group verbal and visual presentation with interactive activity on the components of exercise prescription. Introduces F.I.T.T principle from ACSM for exercise prescriptions. Reviews F.I.T.T. principles of flexibility and balance exercise training including progression. Also discusses the mind body connection.  Reviews various relaxation techniques to help reduce and manage stress (i.e. Deep breathing, progressive muscle relaxation, and visualization). Balance handout provided to take home. Written material given at graduation.   Activity Barriers & Risk Stratification:  Activity Barriers & Cardiac Risk Stratification - 02/17/22 1048       Activity Barriers & Cardiac Risk Stratification   Activity Barriers Right Knee Replacement;Muscular Weakness;Deconditioning;Balance Concerns;Shortness of Breath             6 Minute Walk:  6 Minute Walk     Row Name 02/17/22 1045  6 Minute Walk   Phase Initial     Distance 1320 feet      Walk Time 6 minutes     # of Rest Breaks 0     MPH 2.5     METS 2.72     RPE 11     Perceived Dyspnea  1     VO2 Peak 9.52     Symptoms Yes (comment)     Comments SOB, dizzy on turns     Resting HR 68 bpm     Resting BP 136/64     Resting Oxygen Saturation  96 %     Exercise Oxygen Saturation  during 6 min walk 85 %     Max Ex. HR 103 bpm     Max Ex. BP 134/72     2 Minute Post BP 132/62       Interval HR   1 Minute HR 86     2 Minute HR 95     3 Minute HR 103     4 Minute HR 102     5 Minute HR 102     6 Minute HR 103     2 Minute Post HR 73     Interval Heart Rate? Yes       Interval Oxygen   Interval Oxygen? Yes     Baseline Oxygen Saturation % 96 %     1 Minute Oxygen Saturation % 89 %     1 Minute Liters of Oxygen 0 L  Room Air     2 Minute Oxygen Saturation % 87 %     2 Minute Liters of Oxygen 0 L     3 Minute Oxygen Saturation % 86 %     3 Minute Liters of Oxygen 0 L     4 Minute Oxygen Saturation % 85 %     4 Minute Liters of Oxygen 0 L     5 Minute Oxygen Saturation % 87 %     5 Minute Liters of Oxygen 0 L     6 Minute Oxygen Saturation % 86 %     6 Minute Liters of Oxygen 0 L     2 Minute Post Oxygen Saturation % 95 %     2 Minute Post Liters of Oxygen 0 L             Oxygen Initial Assessment:  Oxygen Initial Assessment - 02/17/22 1115       Home Oxygen   Home Oxygen Device None    Sleep Oxygen Prescription None    Home Exercise Oxygen Prescription None    Home Resting Oxygen Prescription None    Compliance with Home Oxygen Use Yes      Initial 6 min Walk   Oxygen Used None      Program Oxygen Prescription   Program Oxygen Prescription None      Intervention   Short Term Goals To learn and demonstrate proper pursed lip breathing techniques or other breathing techniques. ;To learn and demonstrate proper use of respiratory medications;To learn and understand importance of maintaining oxygen saturations>88%;To learn and understand  importance of monitoring SPO2 with pulse oximeter and demonstrate accurate use of the pulse oximeter.    Long  Term Goals Exhibits proper breathing techniques, such as pursed lip breathing or other method taught during program session;Demonstrates proper use of MDI's;Compliance with respiratory medication;Maintenance of O2 saturations>88%;Verbalizes importance of monitoring SPO2 with pulse oximeter and return demonstration  Oxygen Re-Evaluation:  Oxygen Re-Evaluation     Row Name 02/22/22 0753 03/15/22 0826 03/29/22 0818 04/19/22 0830 05/24/22 0827     Program Oxygen Prescription   Program Oxygen Prescription None None None None None     Home Oxygen   Home Oxygen Device None None None None None   Sleep Oxygen Prescription None None None None None   Home Exercise Oxygen Prescription None None None None None   Home Resting Oxygen Prescription None None None None None   Compliance with Home Oxygen Use Yes Yes Yes Yes Yes     Goals/Expected Outcomes   Short Term Goals To learn and demonstrate proper pursed lip breathing techniques or other breathing techniques. ;To learn and understand importance of maintaining oxygen saturations>88%;To learn and understand importance of monitoring SPO2 with pulse oximeter and demonstrate accurate use of the pulse oximeter. To learn and demonstrate proper pursed lip breathing techniques or other breathing techniques. ;To learn and understand importance of maintaining oxygen saturations>88%;To learn and understand importance of monitoring SPO2 with pulse oximeter and demonstrate accurate use of the pulse oximeter. To learn and demonstrate proper pursed lip breathing techniques or other breathing techniques. ;To learn and understand importance of maintaining oxygen saturations>88%;To learn and understand importance of monitoring SPO2 with pulse oximeter and demonstrate accurate use of the pulse oximeter. To learn and demonstrate proper pursed lip  breathing techniques or other breathing techniques. ;To learn and understand importance of maintaining oxygen saturations>88%;To learn and understand importance of monitoring SPO2 with pulse oximeter and demonstrate accurate use of the pulse oximeter. To learn and demonstrate proper pursed lip breathing techniques or other breathing techniques. ;To learn and understand importance of maintaining oxygen saturations>88%;To learn and understand importance of monitoring SPO2 with pulse oximeter and demonstrate accurate use of the pulse oximeter.   Long  Term Goals Exhibits proper breathing techniques, such as pursed lip breathing or other method taught during program session;Compliance with respiratory medication;Maintenance of O2 saturations>88%;Verbalizes importance of monitoring SPO2 with pulse oximeter and return demonstration Exhibits proper breathing techniques, such as pursed lip breathing or other method taught during program session;Compliance with respiratory medication;Maintenance of O2 saturations>88%;Verbalizes importance of monitoring SPO2 with pulse oximeter and return demonstration Exhibits proper breathing techniques, such as pursed lip breathing or other method taught during program session;Compliance with respiratory medication;Maintenance of O2 saturations>88%;Verbalizes importance of monitoring SPO2 with pulse oximeter and return demonstration Exhibits proper breathing techniques, such as pursed lip breathing or other method taught during program session;Compliance with respiratory medication;Maintenance of O2 saturations>88%;Verbalizes importance of monitoring SPO2 with pulse oximeter and return demonstration Exhibits proper breathing techniques, such as pursed lip breathing or other method taught during program session;Compliance with respiratory medication;Maintenance of O2 saturations>88%;Verbalizes importance of monitoring SPO2 with pulse oximeter and return demonstration   Comments Reviewed  PLB technique with pt.  Talked about how it works and it's importance in maintaining their exercise saturations. Norah is doing well in rehab. She is getting better at PLB and using it more often. She is good about keeping an eye on her breathing at home.  She has ordered a pulse oximeter for home use. Patient reports that she is doing better wither her SOB and using PLB at home to help control this. She stated she does not have pulse ox at home but she plans to order one. Sofiya is doing better with her SOB.  She is good about using her PLB to manage her breathing.  She is pleased with how much her  breathing has improved.  She had some PVCs over weekend that triggered anixety and SOB and dizziness, but breathing helped.  Today, she was feeling better. Arieal is doing better with her SOB.  She is good about using her PLB to manage her breathing.  She is pleased with how much her breathing has improved. She states that she is doing well with her respiratory meds as well.   Goals/Expected Outcomes Short: Become more profiecient at using PLB.   Long: Become independent at using PLB. Short; Use pulse oximeter at home Long: Conitnue to improve PLB Short; Use pulse oximeter at home Long: Conitnue to improve PLB Short: Continue to use PLB Long: Conitnue to monitor saturations at home Short: Continue to use PLB Long: Conitnue to monitor saturations at home            Oxygen Discharge (Final Oxygen Re-Evaluation):  Oxygen Re-Evaluation - 05/24/22 0827       Program Oxygen Prescription   Program Oxygen Prescription None      Home Oxygen   Home Oxygen Device None    Sleep Oxygen Prescription None    Home Exercise Oxygen Prescription None    Home Resting Oxygen Prescription None    Compliance with Home Oxygen Use Yes      Goals/Expected Outcomes   Short Term Goals To learn and demonstrate proper pursed lip breathing techniques or other breathing techniques. ;To learn and understand importance of  maintaining oxygen saturations>88%;To learn and understand importance of monitoring SPO2 with pulse oximeter and demonstrate accurate use of the pulse oximeter.    Long  Term Goals Exhibits proper breathing techniques, such as pursed lip breathing or other method taught during program session;Compliance with respiratory medication;Maintenance of O2 saturations>88%;Verbalizes importance of monitoring SPO2 with pulse oximeter and return demonstration    Comments Vanessia is doing better with her SOB.  She is good about using her PLB to manage her breathing.  She is pleased with how much her breathing has improved. She states that she is doing well with her respiratory meds as well.    Goals/Expected Outcomes Short: Continue to use PLB Long: Conitnue to monitor saturations at home             Initial Exercise Prescription:  Initial Exercise Prescription - 02/17/22 1000       Date of Initial Exercise RX and Referring Provider   Date 02/17/22    Referring Provider Despina Arias MD      Oxygen   Maintain Oxygen Saturation 88% or higher      Treadmill   MPH 2.2    Grade 1    Minutes 15    METs 2.99      Recumbant Bike   Level 3    RPM 50    Watts 25    Minutes 15    METs 3      NuStep   Level 3    SPM 80    Minutes 15    METs 3      Track   Laps 35    Minutes 15    METs 2.9      Prescription Details   Frequency (times per week) 2    Duration Progress to 30 minutes of continuous aerobic without signs/symptoms of physical distress      Intensity   THRR 40-80% of Max Heartrate 103-138    Ratings of Perceived Exertion 11-13    Perceived Dyspnea 0-4      Progression  Progression Continue to progress workloads to maintain intensity without signs/symptoms of physical distress.      Resistance Training   Training Prescription Yes    Weight 3 lb    Reps 10-15             Perform Capillary Blood Glucose checks as needed.  Exercise Prescription Changes:    Exercise Prescription Changes     Row Name 02/17/22 1000 03/01/22 0700 03/15/22 0800 03/28/22 1400 04/11/22 1500     Response to Exercise   Blood Pressure (Admit) 136/64 132/82 112/60 126/64 116/64   Blood Pressure (Exercise) 134/72 142/72 132/70 122/68 --   Blood Pressure (Exit) 122/62 128/72 126/64 104/70 106/68   Heart Rate (Admit) 68 bpm 83 bpm 71 bpm 86 bpm 75 bpm   Heart Rate (Exercise) 103 bpm 94 bpm 87 bpm 88 bpm 97 bpm   Heart Rate (Exit) 75 bpm 79 bpm 85 bpm 77 bpm 82 bpm   Oxygen Saturation (Admit) 96 % 94 % 95 % 92 % 95 %   Oxygen Saturation (Exercise) 85 % 91 % 92 % 91 % 88 %   Oxygen Saturation (Exit) 95 % 93 % 90 % 96 % 92 %   Rating of Perceived Exertion (Exercise) 11 13 15 11 14    Perceived Dyspnea (Exercise) 1 3 1 2 1    Symptoms SOB, dizzy on turns SOB SOB SOB SOB   Comments walk test results 2nd full day of exercise -- -- --   Duration -- Progress to 30 minutes of  aerobic without signs/symptoms of physical distress Continue with 30 min of aerobic exercise without signs/symptoms of physical distress. Continue with 30 min of aerobic exercise without signs/symptoms of physical distress. Continue with 30 min of aerobic exercise without signs/symptoms of physical distress.   Intensity -- THRR unchanged THRR unchanged THRR unchanged THRR unchanged     Progression   Progression -- Continue to progress workloads to maintain intensity without signs/symptoms of physical distress. Continue to progress workloads to maintain intensity without signs/symptoms of physical distress. Continue to progress workloads to maintain intensity without signs/symptoms of physical distress. Continue to progress workloads to maintain intensity without signs/symptoms of physical distress.   Average METs -- 2.95 3.63 3.65 3.83     Resistance Training   Training Prescription -- Yes Yes Yes Yes   Weight -- 3 lb 3 lb 3 lb 4 lb   Reps -- 10-15 10-15 10-15 10-15     Interval Training   Interval  Training -- No No No No     Treadmill   MPH -- 2.2 3 3 3    Grade -- 1 1 3.5 3.5   Minutes -- 15 15 15 15    METs -- 2.99 3.71 4.74 4.75     Recumbant Bike   Level -- 3 2 3  3.3   Watts -- 25 -- 17 --   Minutes -- 15 15 15 15    METs -- 2.69 3.1 2.41 2.7     NuStep   Level -- 3 3 4 4    Minutes -- 15 15 15 15    METs -- 3.6 4.1 3.8 4.4     Home Exercise Plan   Plans to continue exercise at -- -- Home (comment)  walking, staff videos Home (comment)  walking, staff videos Home (comment)  walking, staff videos   Frequency -- -- Add 3 additional days to program exercise sessions. Add 3 additional days to program exercise sessions. Add 3 additional days to  program exercise sessions.   Initial Home Exercises Provided -- -- 03/15/22 03/15/22 03/15/22     Oxygen   Maintain Oxygen Saturation -- 88% or higher 88% or higher 88% or higher 88% or higher    Row Name 04/25/22 1400 05/10/22 0800 05/25/22 1300 06/06/22 1300       Response to Exercise   Blood Pressure (Admit) 106/68 122/70 102/60 124/72    Blood Pressure (Exit) 118/72 126/70 122/64 122/64    Heart Rate (Admit) 67 bpm 75 bpm 74 bpm 72 bpm    Heart Rate (Exercise) 101 bpm 113 bpm 90 bpm 91 bpm    Heart Rate (Exit) 69 bpm 79 bpm 71 bpm 66 bpm    Oxygen Saturation (Admit) 95 % 91 % 91 % 95 %    Oxygen Saturation (Exercise) 89 % 88 % 91 % 93 %    Oxygen Saturation (Exit) 95 % 93 % 93 % 96 %    Rating of Perceived Exertion (Exercise) 13 13 13 12     Perceived Dyspnea (Exercise) 1 3 3 1     Symptoms SOB SOB SOB SOB    Duration Continue with 30 min of aerobic exercise without signs/symptoms of physical distress. Continue with 30 min of aerobic exercise without signs/symptoms of physical distress. Continue with 30 min of aerobic exercise without signs/symptoms of physical distress. Continue with 30 min of aerobic exercise without signs/symptoms of physical distress.    Intensity THRR unchanged THRR unchanged THRR unchanged THRR unchanged       Progression   Progression Continue to progress workloads to maintain intensity without signs/symptoms of physical distress. Continue to progress workloads to maintain intensity without signs/symptoms of physical distress. Continue to progress workloads to maintain intensity without signs/symptoms of physical distress. Continue to progress workloads to maintain intensity without signs/symptoms of physical distress.    Average METs 4.83 5.13 4.19 4.3      Resistance Training   Training Prescription Yes Yes Yes Yes    Weight 4 lb 4 lb 4 lb 4 lb    Reps 10-15 10-15 10-15 10-15      Interval Training   Interval Training No No No No      Treadmill   MPH -- 3 2.5 --    Grade -- 4 2.5 --    Minutes -- 15 15 --    METs -- 4.95 3.78 --      Recumbant Bike   Level -- -- 4 --    Minutes -- -- 15 --      NuStep   Level 4 4 4 4     Minutes 15 15 15 17     METs 5.4 6.2 4.6 5.4      REL-XR   Level -- 3 -- --    Minutes -- 15 -- --    METs -- 3.1 -- --      Home Exercise Plan   Plans to continue exercise at Home (comment)  walking, staff videos Home (comment)  walking, staff videos Home (comment)  walking, staff videos Home (comment)  walking, staff videos    Frequency Add 3 additional days to program exercise sessions. Add 3 additional days to program exercise sessions. Add 3 additional days to program exercise sessions. Add 3 additional days to program exercise sessions.    Initial Home Exercises Provided 03/15/22 03/15/22 03/15/22 03/15/22      Oxygen   Maintain Oxygen Saturation 88% or higher 88% or higher 88% or higher 88% or higher  Exercise Comments:   Exercise Comments     Row Name 02/22/22 [phone]           Exercise Comments First full day of exercise!  Patient was oriented to gym and equipment including functions, settings, policies, and procedures.  Patient's individual exercise prescription and treatment plan were reviewed.  All starting workloads were  established based on the results of the 6 minute walk test done at initial orientation visit.  The plan for exercise progression was also introduced and progression will be customized based on patient's performance and goals.                Exercise Goals and Review:   Exercise Goals     Row Name 02/17/22 1112             Exercise Goals   Increase Physical Activity Yes       Intervention Provide advice, education, support and counseling about physical activity/exercise needs.;Develop an individualized exercise prescription for aerobic and resistive training based on initial evaluation findings, risk stratification, comorbidities and participant's personal goals.       Expected Outcomes Short Term: Attend rehab on a regular basis to increase amount of physical activity.;Long Term: Add in home exercise to make exercise part of routine and to increase amount of physical activity.;Long Term: Exercising regularly at least 3-5 days a week.       Increase Strength and Stamina Yes       Intervention Provide advice, education, support and counseling about physical activity/exercise needs.;Develop an individualized exercise prescription for aerobic and resistive training based on initial evaluation findings, risk stratification, comorbidities and participant's personal goals.       Expected Outcomes Short Term: Increase workloads from initial exercise prescription for resistance, speed, and METs.;Short Term: Perform resistance training exercises routinely during rehab and add in resistance training at home;Long Term: Improve cardiorespiratory fitness, muscular endurance and strength as measured by increased METs and functional capacity (6MWT)       Able to understand and use rate of perceived exertion (RPE) scale Yes       Intervention Provide education and explanation on how to use RPE scale       Expected Outcomes Short Term: Able to use RPE daily in rehab to express subjective intensity  level;Long Term:  Able to use RPE to guide intensity level when exercising independently       Able to understand and use Dyspnea scale Yes       Intervention Provide education and explanation on how to use Dyspnea scale       Expected Outcomes Short Term: Able to use Dyspnea scale daily in rehab to express subjective sense of shortness of breath during exertion;Long Term: Able to use Dyspnea scale to guide intensity level when exercising independently       Knowledge and understanding of Target Heart Rate Range (THRR) Yes       Intervention Provide education and explanation of THRR including how the numbers were predicted and where they are located for reference       Expected Outcomes Short Term: Able to state/look up THRR;Short Term: Able to use daily as guideline for intensity in rehab;Long Term: Able to use THRR to govern intensity when exercising independently       Able to check pulse independently Yes       Intervention Provide education and demonstration on how to check pulse in carotid and radial arteries.;Review the importance of being able to check your  own pulse for safety during independent exercise       Expected Outcomes Short Term: Able to explain why pulse checking is important during independent exercise;Long Term: Able to check pulse independently and accurately       Understanding of Exercise Prescription Yes       Intervention Provide education, explanation, and written materials on patient's individual exercise prescription       Expected Outcomes Short Term: Able to explain program exercise prescription;Long Term: Able to explain home exercise prescription to exercise independently                Exercise Goals Re-Evaluation :  Exercise Goals Re-Evaluation     Row Name 02/22/22 0752 03/01/22 0759 03/15/22 0805 03/28/22 1426 03/29/22 0802     Exercise Goal Re-Evaluation   Exercise Goals Review Increase Physical Activity;Able to understand and use rate of perceived  exertion (RPE) scale;Knowledge and understanding of Target Heart Rate Range (THRR);Understanding of Exercise Prescription;Increase Strength and Stamina;Able to check pulse independently;Able to understand and use Dyspnea scale Increase Physical Activity;Increase Strength and Stamina;Understanding of Exercise Prescription Increase Physical Activity;Increase Strength and Stamina;Understanding of Exercise Prescription;Able to understand and use rate of perceived exertion (RPE) scale;Able to understand and use Dyspnea scale;Knowledge and understanding of Target Heart Rate Range (THRR);Able to check pulse independently Increase Physical Activity;Increase Strength and Stamina;Understanding of Exercise Prescription Increase Physical Activity;Increase Strength and Stamina;Understanding of Exercise Prescription   Comments Reviewed RPE and dyspnea scales, THR and program prescription with pt today.  Pt voiced understanding and was given a copy of goals to take home. Katharyn is doing well to start rehab. She has tolerated level 3 on the T4. She has also done well on the treadmill at 2.2 mph. We will continue to monitor her progress in the program. Terralyn is doing well in rehab.  She is already walking on the days she is not in class at least three times a week.  Reviewed home exercise with pt today.  Pt plans to walk and use staff videos at home for exercise.  Reviewed THR, pulse, RPE, sign and symptoms, pulse oximetery and when to call 911 or MD.  Also discussed weather considerations and indoor options.  Pt voiced understanding. Amirrah is doing well in rehab.  She is up to level 4 on the NuStep and 17 watts on the bike. She has also moved up her treadmill to 3.0 mph!  We will continue to monitor her progress. Nidhi reports that she has made improvements in her strength and stamina. Her SOB has also improved with exercise and she is using PLB to control SOB during exersion. She does not have a pulse ox to monitor SaO2 levels  but plans to get one.   Expected Outcomes Short: Use RPE daily to regulate intensity. Long: Follow program prescription in THR. Short: continue to increase treadmill speed. Long: Continue to increase MET levels. Short: Continue to walk and try staff videos Long: Conitnue to exercise independently Short: Try 4 lb weights Long: Continue to improve stamina Short: buy a pulse oximeter. Long: become independent with exercise program.    Row Name 04/11/22 1512 04/19/22 0821 04/25/22 1428 05/10/22 0853 05/24/22 0817     Exercise Goal Re-Evaluation   Exercise Goals Review Increase Physical Activity;Increase Strength and Stamina;Understanding of Exercise Prescription Increase Physical Activity;Increase Strength and Stamina;Understanding of Exercise Prescription Increase Physical Activity;Increase Strength and Stamina;Understanding of Exercise Prescription Increase Physical Activity;Increase Strength and Stamina;Understanding of Exercise Prescription Increase Physical Activity;Increase Strength and  Stamina;Understanding of Exercise Prescription   Comments Latricia continues to do well in rehab. She has reached working over 4 METS on the T4 Nustep. She increased to 4 lbs for handweights. She is not qutie hitting her THR. Will continue to monitor. Tonda is doing well in rehab.  Her breathing is starting to feel better too.  She is starting to feel better overall.  She is walking on her off days for 45-60 min depending on breathing. Vanna is doing well in rehab. She recently improved her overall average MET level to 4.83 METs. She also has tolerated level 4 on the T4 for the full 30 minutes of exercise. She has also done well with 4 lb hand weights for resistance training. We will continue to monitor her progress in the program. Aleda continues to do well in rehab. She improved her overall average MET level to 5.13 METs. She also increased her incline on the treadmill to 4% while maintaining a speed of 3 mph. She began  using the XR machine as well, and tolerated level 3 well. We will continue to monitor her progress in the program. Jashayla is doing well in the program. She states that she is walking three days a week in addition to her two days of rehab. She is also trying to learn yoga for exercise and relaxation. She also states that her endurance has increased since starting the program as well. She is now able to walk about an hour, and she was only able to walk around 30 to 40 min when she first started the program. We will continue to monitor her progress in the program.   Expected Outcomes Short: Incread load and watts on RB Long: Continue to increase overall MET level Short: Continue to exercise regularly Long: Continue to improve stamina Short: Continue to increase workload on the treadmill. Long: Continue to increase strength and stamina. Short: Continue to increase workload on the treadmill. Long: Continue to increase strength and stamina. Short: Continue to walk on days away from rehab. Long: Continue to increase strength and stamina.    [place] Name 06/06/22 1357             Exercise Goal Re-Evaluation   Exercise Goals Review Increase Physical Activity;Increase Strength and Stamina;Understanding of Exercise Prescription       Comments Maxine continues to do well in rehab. She has tolerated the T4 at level 4. She has also done well with 4 lb hand weights for resistance training. She had an overall average MET level of 4.3 METs. We will continue to monitor her progress in the program.       Expected Outcomes Short: Continue to try to walk more. Long: Continue to increase strength and stamina.                Discharge Exercise Prescription (Final Exercise Prescription Changes):  Exercise Prescription Changes - 06/06/22 1300       Response to Exercise   Blood Pressure (Admit) 124/72    Blood Pressure (Exit) 122/64    Heart Rate (Admit) 72 bpm    Heart Rate (Exercise) 91 bpm    Heart Rate (Exit) 66  bpm    Oxygen Saturation (Admit) 95 %    Oxygen Saturation (Exercise) 93 %    Oxygen Saturation (Exit) 96 %    Rating of Perceived Exertion (Exercise) 12    Perceived Dyspnea (Exercise) 1    Symptoms SOB    Duration Continue with 30 min of aerobic exercise  without signs/symptoms of physical distress.    Intensity THRR unchanged      Progression   Progression Continue to progress workloads to maintain intensity without signs/symptoms of physical distress.    Average METs 4.3      Resistance Training   Training Prescription Yes    Weight 4 lb    Reps 10-15      Interval Training   Interval Training No      NuStep   Level 4    Minutes 15    METs 5.4      Home Exercise Plan   Plans to continue exercise at Home (comment)   walking, staff videos   Frequency Add 3 additional days to program exercise sessions.    Initial Home Exercises Provided 03/15/22      Oxygen   Maintain Oxygen Saturation 88% or higher             Nutrition:  Target Goals: Understanding of nutrition guidelines, daily intake of sodium '1500mg'$ , cholesterol '200mg'$ , calories 30% from fat and 7% or less from saturated fats, daily to have 5 or more servings of fruits and vegetables.  Education: All About Nutrition: -Group instruction provided by verbal, written material, interactive activities, discussions, models, and posters to present general guidelines for heart healthy nutrition including fat, fiber, MyPlate, the role of sodium in heart healthy nutrition, utilization of the nutrition label, and utilization of this knowledge for meal planning. Follow up email sent as well. Written material given at graduation.   Biometrics:  Pre Biometrics - 02/17/22 1112       Pre Biometrics   Height $Remov'5\' 8"'sazvEK$  (1.727 m)    Weight 247 lb 12.8 oz (112.4 kg)    BMI (Calculated) 37.69    Single Leg Stand 4.4 seconds              Nutrition Therapy Plan and Nutrition Goals:  Nutrition Therapy & Goals - 03/07/22  0816       Nutrition Therapy   Diet Heart healthy, low Na, pulmonary MNT    Drug/Food Interactions Statins/Certain Fruits    Protein (specify units) 100g   x1.2/kg adjusted body weight   Fiber 25 grams    Whole Grain Foods 3 servings    Saturated Fats 16 max. grams    Fruits and Vegetables 8 servings/day    Sodium 2 grams      Personal Nutrition Goals   Nutrition Goal ST: try whole wheat bread, try out low prep meals follow MyPlate LT: Follow MyPlate guidelines, 216K protein/day, 25g fiber/day    Comments 66 y.o. F admitted to pulmonary rehab for ILD. PMHx includes connective tissue disease, HTN, GERD, systemic lupus erythematosus, fibromyalgia, burning mouth syndrome, anxiety, PTSD. Relevant medications includes lipitor, biotin, wellbutrin, lamotrigine, vit D, klonopin, hydroxychloroquine, linzess, melatonin, metformin, omeprazole, miralax, prednisone, trazadone, venlafaxine. PYP Score: 62. Vegetables & Fruits 6/12. Breads, Grains & Cereals 7/12. Red & Processed Meat 12/12. Poultry 0/2. Fish & Shellfish 0/4. Beans, Nuts & Seeds 1/4. Milk & Dairy Foods 5/6. Toppings, Oils, Seasonings & Salt 17/20. Sweets, Snacks & Restaurant Food 6/14. Beverages 8/10.   She does not cook much. B: strawberries, breakfast bar L: peanut butter and banana sandwich S: fruit (grapes) D: chicken salad sandwich, chili, baked chicken. She uses honey wheat bread, she will sometimes cook food and then freeze them such as chilli and beans, she likes sweet potato fries. Drinks: water. Discussed heart healthy eating and pulmonary MNT.      Intervention  Plan   Intervention Prescribe, educate and counsel regarding individualized specific dietary modifications aiming towards targeted core components such as weight, hypertension, lipid management, diabetes, heart failure and other comorbidities.    Expected Outcomes Short Term Goal: Understand basic principles of dietary content, such as calories, fat, sodium, cholesterol and  nutrients.;Short Term Goal: A plan has been developed with personal nutrition goals set during dietitian appointment.;Long Term Goal: Adherence to prescribed nutrition plan.             Nutrition Assessments:  MEDIFICTS Score Key: ?70 Need to make dietary changes  40-70 Heart Healthy Diet ? 40 Therapeutic Level Cholesterol Diet  Flowsheet Row Pulmonary Rehab from 02/17/2022 in [facility]  Picture Your Plate Total Score on Admission 62      Picture Your Plate Scores: <42 Unhealthy dietary pattern with much room for improvement. 41-50 Dietary pattern unlikely to meet recommendations for good health and room for improvement. 51-60 More healthful dietary pattern, with some room for improvement.  >60 Healthy dietary pattern, although there may be some specific behaviors that could be improved.   Nutrition Goals Re-Evaluation:  Nutrition Goals Re-Evaluation     [place] Name 03/29/22 [phone] 04/19/22 0827 04/28/22 0808 05/24/22 1157       Goals   Nutrition Goal -- Short: Continue to read food labels. Long: help control risk factors with nutrition changes. -- --    Comment Patient reports that she has been trying to encorporate more whole grains and fruit into her diet. She is working on reading labels to make sure her foods have good amounts of protein and fiber. Eliyana is doing well with her diet.  She ate bad when her anxiety kicked up with the PVCs but now she is getting back to it.  She wants to get back down to 160 lb especially if she can come off the steroids.  She is getting back to the habit of reading labels and being aware of her calories and food intake. Clinton reports needing help with soft foods, provided handouts for this as well as reviewed soft foods that are still heart healthy. Melizza would like to know how to enhance flavor of greek yogurt - discussed being mindful of added sugar, but she could make it sweeter with honey, fruit, or peanut butter. If she  would like to have it more savory she could add roasted vegetables, pesto, or make a dip like tzatziki. Reviewed heart healthy foods and balanced meals/snacks; Tandi reports still having general heart healthy eating handouts given at initial consultation which includes foods recommended/not recommended, guidelines for general heart healthy eating such as recommended amount of saturated fat, and MyPlate guidelines. --    Expected Outcome Short: Continue to read food labels. Long: help control risk factors with nutrition changes. Short: Get back to diet again after anxiety up kick Long: Continue to follow food label and MyPlate guidelines -- Short: add more vegetables in diet through smoothies. Long: Continue to follow MyPlate guidelines.             Nutrition Goals Discharge (Final Nutrition Goals Re-Evaluation):  Nutrition Goals Re-Evaluation - 05/24/22 2620       Goals   Expected Outcome Short: add more vegetables in diet through smoothies. Long: Continue to follow MyPlate guidelines.             Psychosocial: Target Goals: Acknowledge presence or absence of significant depression and/or stress, maximize coping skills, provide positive support system. Participant is able  to verbalize types and ability to use techniques and skills needed for reducing stress and depression.   Education: Stress, Anxiety, and Depression - Group verbal and visual presentation to define topics covered.  Reviews how body is impacted by stress, anxiety, and depression.  Also discusses healthy ways to reduce stress and to treat/manage anxiety and depression.  Written material given at graduation. Flowsheet Row Pulmonary Rehab from 04/21/2022 in [facility]  Date 02/24/22  Educator [facility]  Instruction Review Code 1- [employer] Understanding       Education: Sleep Hygiene -Provides group verbal and written instruction about how sleep can affect your health.  Define sleep hygiene, discuss  sleep cycles and impact of sleep habits. Review good sleep hygiene tips.    Initial Review & Psychosocial Screening:  Initial Psych Review & Screening - 02/02/22 1421       Initial Review   Current issues with Current Psychotropic Meds;Current Depression;Current Anxiety/Panic;Current Stress Concerns    Source of Stress Concerns Chronic Illness    Comments several health issues can cause her to feel down. Tries to focus on the positive.      Family Dynamics   Good Support System? Yes   son and daughter, friend     Barriers   Psychosocial barriers to participate in program There are no identifiable barriers or psychosocial needs.;The patient should benefit from training in stress management and relaxation.      Screening Interventions   Interventions Encouraged to exercise;To provide support and resources with identified psychosocial needs;Provide feedback about the scores to participant    Expected Outcomes Short Term goal: Utilizing psychosocial counselor, staff and physician to assist with identification of specific Stressors or current issues interfering with healing process. Setting desired goal for each stressor or current issue identified.;Long Term Goal: Stressors or current issues are controlled or eliminated.;Short Term goal: Identification and review with participant of any Quality of Life or Depression concerns found by scoring the questionnaire.;Long Term goal: The participant improves quality of Life and PHQ9 Scores as seen by post scores and/or verbalization of changes             Quality of Life Scores:  Scores of 19 and below usually indicate a poorer quality of life in these areas.  A difference of  2-3 points is a clinically meaningful difference.  A difference of 2-3 points in the total score of the Quality of Life Index has been associated with significant improvement in overall quality of life, self-image, physical symptoms, and general health in studies assessing  change in quality of life.  PHQ-9: Review Flowsheet  More data exists      06/06/2022 04/15/2022 04/12/2022 03/15/2022 02/17/2022  Depression screen PHQ 2/9  Decreased Interest 1 3 1 1 1   Down, Depressed, Hopeless 3 3 1 1 1   PHQ - 2 Score 4 6 2 2 2   Altered sleeping 1 1 0 2 2  Tired, decreased energy 3 1 2 1 1   Change in appetite 3 1 3  0 0  Feeling bad or failure about yourself  3 1 1 1 1   Trouble concentrating 0 1 1 0 2  Moving slowly or fidgety/restless 0 0 1 1 1   Suicidal thoughts 0 0 0 0 0  PHQ-9 Score 14 11 10 7 9   Difficult doing work/chores - - Very difficult Somewhat difficult Somewhat difficult   Interpretation of Total Score  Total Score Depression Severity:  1-4 = Minimal depression, 5-9 = Mild depression,  10-14 = Moderate depression, 15-19 = Moderately severe depression, 20-27 = Severe depression   Psychosocial Evaluation and Intervention:  Psychosocial Evaluation - 02/02/22 1435       Psychosocial Evaluation & Interventions   Interventions Encouraged to exercise with the program and follow exercise prescription    Comments Capitano has no barriers to attending the program. She lives alone. She has a son and a daughter that are her support along with her firends. She is on meds for depression and anxiety. She has multiple illnesses and it can bring her down at times. She tries hard to look at the bright side to prevent being pulled down. She wants to see if she can help her lungs heal as much as possible. She is reday to get started.    Expected Outcomes STG Minha is able to attend all scheduled sessions. She learns rlaxation and de stress exercises while here. LTG Jaylinn is able to continue her exercise progression and manage her stress and depression easier.    Continue Psychosocial Services  Follow up required by staff             Psychosocial Re-Evaluation:  Psychosocial Re-Evaluation     [place] Name 03/15/22 0813 03/29/22 0811 04/19/22 0825 05/12/22 0749        Psychosocial Re-Evaluation   Current issues with Current Psychotropic Meds;History of Depression;Current Anxiety/Panic;Current Depression;Current Sleep Concerns Current Psychotropic Meds;History of Depression;Current Anxiety/Panic;Current Depression;Current Sleep Concerns Current Psychotropic Meds;History of Depression;Current Anxiety/Panic;Current Depression;Current Sleep Concerns Current Psychotropic Meds;History of Depression;Current Anxiety/Panic;Current Depression;Current Sleep Concerns    Comments Pallas is doing well mentally.  She feels that she is doing a little better with her depression currently as she is is just feeling better overall.  Her PHQ has improved by 2 pts over last couple of weeks. She has been struggling with sleep recently for both going to sleep and waking up.  She lays in bed for a while before going to sleep.  She wakes early and unable to go back to sleep.  She takes melatonin each nigh in tablet form.  She is going to try to up her dose some and take 30 min before bed.   She is feeling well managed on her meds.  She does get frustrated with feeling like she still gets SOB. Patient reports that she is going well mentally. She is still on treatment but everything has been stable with no new concerns. She has reported that her SOB is improving since starting the program. Adonna had a scare over weekend with PVCs and went to ED.  It triggered her anxiety and still trying to calm down.  She has a referral to cardiology and an appointment with pulmonary tomorrow.  She is sleeping good. She just wants to find out why they are happening and to make sure she's okay.  Her son has been worried about her and keeps calling to check in on her. Reviewed patient health questionnaire (PHQ-9) with patient for follow up. Previously, patients score indicated signs/symptoms of depression.  Reviewed to see if patient is improving symptom wise while in program.  Score stayed the same and patient states  that it is because she has been having little energy.    Expected Outcomes Short: Try to increase melatonin to help with sleep Long: Continue to focus on positive and exercies for mental boost. Short: continue in current treatment. Long: continue to work on positive mental health  behaviors. Short: Talk to doctor about PVCs  Long: Continue  to focus on positive Short: Continue to work toward an improvement in [place] scores by attending [facility] regularly. Long: Continue to improve stress and depression coping skills by talking with staff and attending [facility] regularly and work toward a positive mental state.    Interventions Encouraged to attend [facility] for the exercise;Stress management education Encouraged to attend [facility] Rehabilitation for the exercise;Stress management education Encouraged to attend [facility] for the exercise;Stress management education Encouraged to attend [facility] for the exercise    Continue Psychosocial Services  Follow up required by staff Follow up required by staff -- Follow up required by staff             Psychosocial Discharge (Final Psychosocial Re-Evaluation):  Psychosocial Re-Evaluation - 05/12/22 0749       Psychosocial Re-Evaluation   Current issues with Current Psychotropic Meds;History of Depression;Current Anxiety/Panic;Current Depression;Current Sleep Concerns    Comments Reviewed patient health questionnaire (PHQ-9) with patient for follow up. Previously, patients score indicated signs/symptoms of depression.  Reviewed to see if patient is improving symptom wise while in program.  Score stayed the same and patient states that it is because she has been having little energy.    Expected Outcomes Short: Continue to work toward an improvement in [place] scores by attending [facility] regularly. Long: Continue to improve stress and depression coping skills by talking with staff and attending  [facility] regularly and work toward a positive mental state.    Interventions Encouraged to attend [facility]monary Rehabilitation for the exercise    Continue Psychosocial Services  Follow up required by staff             Education: Education Goals: Education classes will be provided on a weekly basis, covering required topics. Participant will state understanding/return demonstration of topics presented.  Learning Barriers/Preferences:   General Pulmonary Education Topics:  Infection Prevention: - Provides verbal and written material to individual with discussion of infection control including proper hand washing and proper equipment cleaning during exercise session. Flowsheet Row Pulmonary Rehab from 04/21/2022 in [facility]  Date 02/17/22  Educator [facility]  Instruction Review Code 1- Verbalizes Understanding       Falls Prevention: - Provides verbal and written material to individual with discussion of falls prevention and safety. Flowsheet Row Pulmonary Rehab from 04/21/2022 in [facility] Pulmonary Re[facility]  Date 02/02/22  Educator [facility]  Instruction Review Code 1- Verbalizes Understanding       Chronic Lung Disease Review: - Group verbal instruction with posters, models, PowerPoint presentations and videos,  to review new updates, new respiratory medications, new advancements in procedures and treatments. Providing information on websites and "800" numbers for continued self-education. Includes information about supplement oxygen, available portable oxygen systems, continuous and intermittent flow rates, oxygen safety, concentrators, and Medicare reimbursement for oxygen. Explanation of Pulmonary Drugs, including class, frequency, complications, importance of spacers, rinsing mouth after steroid MDI's, and proper cleaning methods for nebulizers. Review of basic lung anatomy and physiology related to function, structure, and complications of lung disease. Review of  risk factors. Discussion about methods for diagnosing sleep apnea and types of masks and machines for OSA. Includes a review of the use of types of environmental controls: home humidity, furnaces, filters, dust mite/pet prevention, HEPA vacuums. Discussion about weather changes, air quality and the benefits of nasal washing. Instruction on Warning signs, infection symptoms, calling MD promptly, preventive modes, and value of vaccinations. Review of effective airway clearance, coughing and/or vibration techniques. Emphasizing that all  should Create an Sports administrator. Written material given at graduation. Flowsheet Row Pulmonary Rehab from 04/21/2022 in [facility] Pulmonary Re[facility]  Education need identified 02/17/22  Date 02/17/22  Educator [facility]  Instruction Review Code 1- Verbalizes Understanding       AED/CPR: - Group verbal and written instruction with the use of models to demonstrate the basic use of the AED with the basic ABC's of resuscitation.    Anatomy and Cardiac Procedures: - Group verbal and visual presentation and models provide information about basic cardiac anatomy and function. Reviews the testing methods done to diagnose heart disease and the outcomes of the test results. Describes the treatment choices: Medical Management, Angioplasty, or Coronary Bypass Surgery for treating various heart conditions including Myocardial Infarction, Angina, Valve Disease, and Cardiac Arrhythmias.  Written material given at graduation.   Medication Safety: - Group verbal and visual instruction to review commonly prescribed medications for heart and lung disease. Reviews the medication, class of the drug, and side effects. Includes the steps to properly store meds and maintain the prescription regimen.  Written material given at graduation.   Other: -Provides group and verbal instruction on various topics (see comments)   Knowledge Questionnaire Score:  Knowledge Questionnaire Score -  02/17/22 1114       Knowledge Questionnaire Score   Pre Score 12/18              Core Components/Risk Factors/Patient Goals at Admission:  Personal Goals and Risk Factors at Admission - 02/17/22 1114       Core Components/Risk Factors/Patient Goals on Admission    Weight Management Yes;Weight Loss;Obesity    Intervention Weight Management: Develop a combined nutrition and exercise program designed to reach desired caloric intake, while maintaining appropriate intake of nutrient and fiber, sodium and fats, and appropriate energy expenditure required for the weight goal.;Weight Management/Obesity: Establish reasonable short term and long term weight goals.;Obesity: Provide education and appropriate resources to help participant work on and attain dietary goals.;Weight Management: Provide education and appropriate resources to help participant work on and attain dietary goals.    Admit Weight 247 lb 12.8 oz (112.4 kg)    Goal Weight: Short Term 243 lb (110.2 kg)    Goal Weight: Long Term 200 lb (90.7 kg)    Expected Outcomes Short Term: Continue to assess and modify interventions until short term weight is achieved;Weight Loss: Understanding of general recommendations for a balanced deficit meal plan, which promotes 1-2 lb weight loss per week and includes a negative energy balance of [phone] kcal/d;Understanding recommendations for meals to include 15-35% energy as protein, 25-35% energy from fat, 35-60% energy from carbohydrates, less than $RemoveB'200mg'wmIJoOWL$  of dietary cholesterol, 20-35 gm of total fiber daily;Understanding of distribution of calorie intake throughout the day with the consumption of 4-5 meals/snacks    Improve shortness of breath with ADL's Yes    Intervention Provide education, individualized exercise plan and daily activity instruction to help decrease symptoms of SOB with activities of daily living.    Expected Outcomes Short Term: Improve cardiorespiratory fitness to achieve a  reduction of symptoms when performing ADLs;Long Term: Be able to perform more ADLs without symptoms or delay the onset of symptoms    Increase knowledge of respiratory medications and ability to use respiratory devices properly  Yes    Intervention Provide education and demonstration as needed of appropriate use of medications, inhalers, and oxygen therapy.    Expected Outcomes Short Term: Achieves understanding of medications use. Understands that oxygen  is a medication prescribed by physician. Demonstrates appropriate use of inhaler and oxygen therapy.;Long Term: Maintain appropriate use of medications, inhalers, and oxygen therapy.    Hypertension Yes    Intervention Provide education on lifestyle modifcations including regular physical activity/exercise, weight management, moderate sodium restriction and increased consumption of fresh fruit, vegetables, and low fat dairy, alcohol moderation, and smoking cessation.;Monitor prescription use compliance.    Expected Outcomes Short Term: Continued assessment and intervention until BP is < 140/51mm HG in hypertensive participants. < 130/41mm HG in hypertensive participants with diabetes, heart failure or chronic kidney disease.;Long Term: Maintenance of blood pressure at goal levels.    Lipids Yes    Intervention Provide education and support for participant on nutrition & aerobic/resistive exercise along with prescribed medications to achieve LDL '70mg'$ , HDL >$Remo'40mg'fUaXu$ .    Expected Outcomes Short Term: Participant states understanding of desired cholesterol values and is compliant with medications prescribed. Participant is following exercise prescription and nutrition guidelines.;Long Term: Cholesterol controlled with medications as prescribed, with individualized exercise RX and with personalized nutrition plan. Value goals: LDL < $Rem'70mg'Rtis$ , HDL > 40 mg.             Education:Diabetes - Individual verbal and written instruction to review signs/symptoms of  diabetes, desired ranges of glucose level fasting, after meals and with exercise. Acknowledge that pre and post exercise glucose checks will be done for 3 sessions at entry of program.   Know Your Numbers and Heart Failure: - Group verbal and visual instruction to discuss disease risk factors for cardiac and pulmonary disease and treatment options.  Reviews associated critical values for Overweight/Obesity, Hypertension, Cholesterol, and Diabetes.  Discusses basics of heart failure: signs/symptoms and treatments.  Introduces Heart Failure Zone chart for action plan for heart failure.  Written material given at graduation.   Core Components/Risk Factors/Patient Goals Review:   Goals and Risk Factor Review     Row Name 03/15/22 0823 03/29/22 0809 04/19/22 0822 05/24/22 0823       Core Components/Risk Factors/Patient Goals Review   Personal Goals Review Weight Management/Obesity;Increase knowledge of respiratory medications and ability to use respiratory devices properly.;Improve shortness of breath with ADL's;Hypertension;Lipids Weight Management/Obesity;Increase knowledge of respiratory medications and ability to use respiratory devices properly.;Improve shortness of breath with ADL's;Hypertension;Lipids Weight Management/Obesity;Increase knowledge of respiratory medications and ability to use respiratory devices properly.;Improve shortness of breath with ADL's;Hypertension;Lipids Weight Management/Obesity;Increase knowledge of respiratory medications and ability to use respiratory devices properly.;Improve shortness of breath with ADL's;Hypertension;Lipids    Review Carron is doing well in rehab.  Her weight is trending down.  She is down to 246 lb today.  She is still getting SOB some days.  Today, was the first day that she felt winded before class and during class, so she knows that it is getting better overall.  She is on prednisone and doing well with her inhalers to help her breathing.  Her  pressures continue to do well and she is checking them three times a week at home. Selia reports that she has had steady weight loss of about 1 lb a week. She montiors her BP at home and reports that they are in acceptable ranges. She also reports improvements in SOB and reports using PLB to help control this. Morgyn was in ED over weekend for PVCs and now has a referral for cardiology to look at them.  She was SOB and dizziheaded.  They talked about her doing a Zio patch to review.  Her breathing is feeling  better since when she started.  She is now able to do more before getting SOB.  She is doing well on her respiratory meds.  She is doing well with her pressures.  Her weight continues to trend down.  They talked about changing some meds to include a beta blocker to help with heart. Merideth is still looing to lose some weight. She stated that she would like to lose around 50 lbs as a long term goal. Aslin states that she has not been checking her BP at home, but has the resources to do so. She is doing well with her respiratory meds as well. Her SOB with exercise has improved compared to when she started the program also.    Expected Outcomes Short: Continue to work on breathing Long: Conitnue to work on [employer] loss Short: continue to use PLB to help control SOB. Long: continue to work on weight loss goal. Short: Get cardiology appt to see about PVCs  Long; Continue to monitor risk factors. Short: Check BP at home more regularly. Long: Continue to monitor lifestyle risk factors.             Core Components/Risk Factors/Patient Goals at Discharge (Final Review):   Goals and Risk Factor Review - 05/24/22 0823       Core Components/Risk Factors/Patient Goals Review   Personal Goals Review Weight Management/Obesity;Increase knowledge of respiratory medications and ability to use respiratory devices properly.;Improve shortness of breath with ADL's;Hypertension;Lipids    Review Martine is still looing to  lose some weight. She stated that she would like to lose around 50 lbs as a long term goal. [place] states that she has not been checking her BP at home, but has the resources to do so. She is doing well with her respiratory meds as well. Her SOB with exercise has improved compared to when she started the program also.    Expected Outcomes Short: Check BP at home more regularly. Long: Continue to monitor lifestyle risk factors.             ITP Comments:  ITP Comments     Row Name 02/02/22 1433 02/17/22 1044 02/22/22 0752 03/07/22 0816 03/16/22 0953   ITP Comments Virtual orientation call completed today. shehas an appointment on Date: 02/09/2022  for EP eval and gym Orientation.  Documentation of diagnosis can be found in [facility] Date: 12/29/2021 . Completed 6MWT and gym orientation. Initial ITP created and sent for review to Dr. Zetta Bills, Medical Director. First full day of exercise!  Patient was oriented to gym and equipment including functions, settings, policies, and procedures.  Patient's individual exercise prescription and treatment plan were reviewed.  All starting workloads were established based on the results of the 6 minute walk test done at initial orientation visit.  The plan for exercise progression was also introduced and progression will be customized based on patient's performance and goals. Completed initial RD consultation 30 Day review completed. Medical Director ITP review done, changes made as directed, and signed approval by Medical Director.    [place] Name 04/13/22 0754 05/11/22 0821 06/08/22 0739       ITP Comments 30 Day review completed. Medical Director ITP review done, changes made as directed, and signed approval by Medical Director. 30 Day review completed. Medical Director ITP review done, changes made as directed, and signed approval by Medical Director. 30 Day review completed. Medical Director ITP review done, changes made as directed, and signed approval by  Medical Director.  Comments:

## 2022-06-08 NOTE — Unmapped (Signed)
Otolaryngology Return Voice Visit        History of Present Illness:  CaitlinAddisen CASSANDER Smith is a 66 y.o.  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      At Caitlin end of our previous clinic visit we discussed and recommended clinical course of Respiratory Retraining, Biotene mouthwash, D/C Listerine- switch to salt water gargles      July 2017: I recommended NeilMed rinses and a workup of glossitis (electrolyte and vitamin levels per primary care physician: B12, zinc, etc.,) Her diagnoses included right vocal fold hypomobility/depression anxiety. 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     December 2018: Caitlin Smith notes that her symptoms have been relatively stable.  She continues to complain of burning tongue.  Following this visit I recommended a thyroid hormone panel and thyroid ultrasound.     August 2019: Caitlin Smith reports that she continues to clear there throat and cough in a consistent manner.  Thyroid ultrasound was unremarkable w/o nodules. She occasionally (rarely ) awakens from sleep with cough. She has done a few sessions with SLP, but claims to have forgotten Caitlin techniques.  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  I additionally offered behavioral intervention for her cough symptom.     April 2021: In Caitlin interim Caitlin Smith is followed up with neurosurgery and has had radiography demonstrating no additional lesions.  Caitlin Smith did however complain of right-sided head throbbing, and otalgia.  Ms. Nasso notes that she has continued to have balance difficulty, intermittent cough and dysphonia.  Lastly, she reports that her throat has been burning more than in Caitlin past though she has not seen her GI physician for some time.  Caitlin Smith reports that she has had ongoing right otalgia that she is treated with hydrogen peroxide.  She has had her ears flushed by her primary care physician without resolution.  Caitlin Smith was referred to otology for right otalgia.     June 2022: Smith returns with concerns for weak vocal projection, worsened over Caitlin past several weeks. She attributes this to her LPR, but has been taking her daily Prilosec with minimal relief. Otherwise, she has been doing well, without any additional concerns. Given Caitlin stability of Caitlin Smith's physical examination and findings referable to upper airway inflammation, I have a recommended that we proceed with management of her known LPR. Add Famotidine 20 mg BID to supplement Prilosec 40 mg     October 2022: Caitlin Smith returns for follow-up. She states that her voice continues to be hoarse. Caitlin Smith endorses seasonal allergies. She states that she follows up with pulmonology and was started on albuterol inhaler. She endorses coughing symptoms that has been a past ongoing issue. Caitlin Smith states that she has Caitlin sensation of food getting caught in her epigastric region. She endorses abdominal pain, right under Caitlin ribcage, for Caitlin past 4-5 months and has not seen GI. She describes it as a dull pain that will last for a few minutes before going away. She states that Caitlin abdominal pain will occur randomly at rest and has generally not been benefited by reflux medication. She endorses difficulty with bowel movement, which has been an ongoing issue in Caitlin past.I reassured Caitlin Smith that her larynx appears healthy on laryngoscopy today. I recommend NeilMed nasal saline irrigation  for general nasopharyngeal hygiene. I will refer Caitlin Smith to GI for further evaluation of gastric discomfort and constipation.  I offered a referral to GI for gastric discomfort and constipation, and recommended NeilMed nasal irrigations.    October 2023: Caitlin Smith coughs consistently  with pain in Caitlin right ear.  Caitlin cough comes throughout Caitlin day without clear inciting behaviors.  She needs water to calm it down.  Caitlin Smith reports having tried reflux medication, particularly due to Caitlin fact that she also reports some burning in Caitlin throat, however this was unhelpful for her cough symptom.  Caitlin Smith also reports achiness in Caitlin right ear (Caitlin same side as her previous skull base retroauricular approach).      Interval Surgery/Medical History:    Interval  Past Medical History and ROS is otherwise unchanged except as per HPI      Medications:    Current Outpatient Medications:   ???  albuterol HFA 90 mcg/actuation inhaler, Inhale 2 puffs every six (6) hours as needed for wheezing., Disp: 8 g, Rfl: 11  ???  amLODIPine (NORVASC) 2.5 MG tablet, Take 3 tablets (7.5 mg total) by mouth daily., Disp: , Rfl:   ???  aspirin (ECOTRIN) 81 MG tablet, Take 1 tablet (81 mg total) by mouth daily., Disp: , Rfl:   ???  atenoloL (TENORMIN) 25 MG tablet, Take 1 tablet (25 mg total) by mouth Two (2) times a day., Disp: 180 tablet, Rfl: 1  ???  atorvastatin (LIPITOR) 40 MG tablet, Take 1 tablet (40 mg total) by mouth daily., Disp: , Rfl:   ???  biotin 1 mg cap, Take by mouth., Disp: , Rfl:   ???  buPROPion (WELLBUTRIN XL) 300 MG 24 hr tablet, Take 1 tablet (300 mg total) by mouth daily. (Smith taking differently: Take 1 tablet (300 mg total) by mouth every morning.), Disp: 90 tablet, Rfl: 1  ???  calcipotriene (DOVONOX) 0.005 % ointment, Apply topically daily. Use in skin folds and on Caitlin lower leg., Disp: 120 g, Rfl: 2  ???  clobetasoL (TEMOVATE) 0.05 % ointment, Apply topically Two (2) times a week., Disp: 30 g, Rfl: 3  ???  clonazePAM (KLONOPIN) 0.5 MG tablet, Take 1 tablet (0.5 mg total) by mouth once as needed for anxiety or sleep for up to 15 doses., Disp: 15 tablet, Rfl: 0  ???  clotrimazole-betamethasone (LOTRISONE) 1-0.05 % cream, Apply 1 application. topically Two (2) times a day., Disp: , Rfl:   ???  cyclobenzaprine (FLEXERIL) 5 MG tablet, Take 1 tablet (5 mg total) by mouth nightly as needed for muscle spasms., Disp: 60 tablet, Rfl: 2  ???  diclofenac sodium (VOLTAREN) 1 % gel, Apply 2 g topically two (2) times a day as needed for arthritis., Disp: 300 g, Rfl: 5  ???  dicyclomine (BENTYL) 20 mg tablet, Take 1 tablet (20 mg total) by mouth daily as needed., Disp: , Rfl:   ???  famotidine (PEPCID) 20 MG tablet, Take 1 tablet (20 mg total) by mouth Two (2) times a day., Disp: 200 tablet, Rfl: 2  ???  fluticasone propionate (FLONASE) 50 mcg/actuation nasal spray, , Disp: , Rfl:   ???  fluticasone propionate (FLOVENT HFA) 220 mcg/actuation inhaler, Inhale 1 puff Two (2) times a day., Disp: 12 g, Rfl: 11  ???  gabapentin (NEURONTIN) 300 MG capsule, Take 1 capsule (300 mg total) by mouth Three (3) times a day., Disp: 90 capsule, Rfl: 2  ???  hydrALAZINE (APRESOLINE) 10 MG tablet, , Disp: ,  Rfl:   ???  hydrOXYchloroQUINE (PLAQUENIL) 200 mg tablet, TAKE 1 TABLET BY MOUTH  TWICE DAILY, Disp: 180 tablet, Rfl: 3  ???  hydrOXYzine (ATARAX) 10 MG tablet, , Disp: , Rfl:   ???  inhalational spacing device (AEROCHAMBER MV) Spcr, 1 each by Miscellaneous route two (2) times a day. With [employer], Disp: 1 each, Rfl: 0  ???  ketoconazole (NIZORAL) 2 % cream, APP EXT AA D, Disp: , Rfl: 0  ???  lamoTRIgine (LAMICTAL) 200 MG tablet, TAKE 1 TABLET BY MOUTH TWICE  DAILY, Disp: 200 tablet, Rfl: 2  ???  LINZESS 145 mcg capsule, TAKE 1 CAPSULE(145 MCG) BY MOUTH DAILY, Disp: 90 capsule, Rfl: 3  ???  loratadine (CLARITIN) 10 mg tablet, Take 1 tablet (10 mg total) by mouth daily., Disp: , Rfl:   ???  melatonin 10 mg Tab, Take 1 tablet by mouth., Disp: , Rfl:   ???  metFORMIN (GLUCOPHAGE-XR) 500 MG 24 hr tablet, Take 1 tablet (500 mg total) by mouth daily before breakfast., Disp: , Rfl:   ???  metoPROLOL succinate (TOPROL-XL) 25 MG 24 hr tablet, Take 1 tablet (25 mg total) by mouth., Disp: , Rfl:   ???  mupirocin (BACTROBAN) 2 % ointment, APP EXT IEN BID, Disp: , Rfl: 0  ???  mycophenolate (MYFORTIC) 360 MG TbEC, Take 2 tablets (720 mg total) by mouth two (2) times a day., Disp: 360 tablet, Rfl: 3  ???  olopatadine (PATANOL) 0.1 % ophthalmic solution, Apply 1 drop to eye daily., Disp: , Rfl:   ???  omeprazole (PRILOSEC) 40 MG capsule, Take 1 capsule (40 mg total) by mouth two (2) times a day., Disp: 180 capsule, Rfl: 3  ???  ondansetron (ZOFRAN) 4 MG tablet, , Disp: , Rfl:   ???  polyethylene glycol (GLYCOLAX) 17 gram/dose powder, , Disp: , Rfl:   ???  predniSONE (DELTASONE) 2.5 MG tablet, Take 3 tablets (7.5 mg total) by mouth daily., Disp: 270 tablet, Rfl: 3  ???  pregabalin (LYRICA) 50 MG capsule, , Disp: , Rfl:   ???  telmisartan (MICARDIS) 80 MG tablet, Take 1 tablet (80 mg total) by mouth., Disp: , Rfl:   ???  traZODone (DESYREL) 100 MG tablet, TAKE 1 AND 1/2 TABLETS BY  MOUTH AT NIGHT (Smith taking differently: Take 1 tablet (100 mg total) by mouth nightly.), Disp: 135 tablet, Rfl: 3  ???  triamcinolone (KENALOG) 0.1 % ointment, Apply twice a day to affected areas when red and itchy. Stop when improved and use a barrier cream., Disp: 80 g, Rfl: 1  ???  benzonatate (TESSALON PERLES) 100 MG capsule, Take 1 capsule (100 mg total) by mouth every six (6) hours as needed for cough. (Smith not taking: Reported on 05/13/2022), Disp: 30 capsule, Rfl: 1  ???  brompheniramine-pseudoephedrine-DM 2-30-10 mg/5 mL syrup, TAKE 5 ML BY MOUTH EVERY 6 HOURS AS NEEDED (Smith not taking: Reported on 05/13/2022), Disp: , Rfl:   ???  meclizine (ANTIVERT) 25 mg tablet, Take 1 tablet (25 mg total) by mouth Three (3) times a day as needed. (Smith not taking: Reported on 05/13/2022), Disp: , Rfl:   ???  venlafaxine (EFFEXOR) 100 MG tablet, Take 1 tablet (100 mg total) by mouth in Caitlin morning. (Smith not taking: Reported on 06/09/2022), Disp: 30 tablet, Rfl: 11         Pertinent ROS as above and per HPI.     Physical Exam:   Constitutional:  Vitals reviewed on nursing chart.  Voice: Fluctuating/intermittent pitch.  Respiration:  Breathing comfortably, no stridor.   Ears:  Normal tympanic membranes to otoscopy, Nose:  External nose midline, anterior rhinoscopy is normal with limited visualization just to Caitlin anterior interior turbinate.   Oral Cavity/Oropharynx/Lips:  Normal mucous membranes, normal floor of mouth/tongue/oropharynx, no masses or lesions are noted.    Pharyngeal Walls:  No masses noted.  Neck/Lymph:  No lymphadenopathy, no thyroid masses.        Procedure: 06/09/2022 Procedure Note        Endoscopy Type:  Flexible Fiberoptic Videostroboscopy  Indications/TimeOut:  To better evaluate Caitlin Smith???s symptoms, fiberoptic videostroboscopy is indicated.   A time out identifying Caitlin Smith, Caitlin procedure, Caitlin location of Caitlin procedure and any concerns was performed prior to beginning Caitlin procedure.    Procedure Details:    Caitlin Smith was placed in Caitlin sitting position.  After topical anesthesia and decongestion with oxymetazoline and lidocaine, Caitlin flexible laryngoscope was passed.  Caitlin microphone was used to trigger Caitlin xenon stroboscopic light source.      -  Hypopharynx - There were no lesions in Caitlin pyriformis, epiglottis, or base of tongue.  -  Larynx -  there was mild interarytenoid edema, no erythema.  -  Vocal Folds:                 Supraglottis: Normal     Infraglottis/Subglottis:  Normal     Mobility: Right VF hypomobility     Amplitude: Symmetric bilaterally     Mucosal Wave: Mucosal wave intact bilaterally     Closure: Complete     Lesions: None    Condition:  Stable.  Smith tolerated procedure well.    Complications: None      Voice- Related Quality of Life (VR-QOL) Measure:  I have trouble speaking loudly or being heard in noisy situations: 1  I run out of air and need to take frequent breaths when talking: 5  I sometimes do not know what will come out when I begin speaking: 1  I am sometimes anxious or frustrated because of my voice: 4  I sometimes get depressed because of my voice: 1  I have trouble using Caitlin telephone because of my voice: 1  I have trouble doing my job or practicing my profession because of my voice: 1  I avoid going out socially because of my voice: 4  I have to repeat myself to be understood: 2  I have become less outgoing because of my voice: 1  VRQOL Raw Score: 21  Calculated Score: 72.5  Glottal Function Index:  Speaking took extra effort: 3  Throat discomfort or pain after using your voice: 0  Vocal fatigue (voice weakened as you talked): 5  Voice cracks or sounds different: 5  Glottal Function Index Total: 13    Assessment:   66 y.o.  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      At Caitlin end of our previous clinic visit we discussed and recommended clinical course of Respiratory Retraining, Biotene mouthwash, D/C Listerine- switch to salt water gargles      July 2017: I recommended NeilMed rinses and a workup of glossitis (electrolyte and vitamin levels per primary care physician: B12, zinc, etc.,) Her diagnoses included right vocal fold hypomobility/depression anxiety. 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     December 2018: Caitlin Smith notes that her symptoms have  been relatively stable.  She continues to complain of burning tongue.  Following this visit I recommended a thyroid hormone panel and thyroid ultrasound.     August 2019: Caitlin Smith reports that she continues to clear there throat and cough in a consistent manner.  Thyroid ultrasound was unremarkable w/o nodules. She occasionally (rarely ) awakens from sleep with cough. She has done a few sessions with SLP, but claims to have forgotten Caitlin techniques.  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  I additionally offered behavioral intervention for her cough symptom.     April 2021: In Caitlin interim Caitlin Smith is followed up with neurosurgery and has had radiography demonstrating no additional lesions. Caitlin Smith did however complain of right-sided head throbbing, and otalgia.  Caitlin Smith notes that she has continued to have balance difficulty, intermittent cough and dysphonia.  Lastly, she reports that her throat has been burning more than in Caitlin past though she has not seen her GI physician for some time.  Caitlin Smith reports that she has had ongoing right otalgia that she is treated with hydrogen peroxide.  She has had her ears flushed by her primary care physician without resolution.  Caitlin Smith was referred to otology for right otalgia.     June 2022: Smith returns with concerns for weak vocal projection, worsened over Caitlin past several weeks. She attributes this to her LPR, but has been taking her daily Prilosec with minimal relief. Otherwise, she has been doing well, without any additional concerns. Given Caitlin stability of Caitlin Smith's physical examination and findings referable to upper airway inflammation, I have a recommended that we proceed with management of her known LPR. Add Famotidine 20 mg BID to supplement Prilosec 40 mg     October 2022: Caitlin Smith returns for follow-up. She states that her voice continues to be hoarse. Caitlin Smith endorses seasonal allergies. She states that she follows up with pulmonology and was started on albuterol inhaler. She endorses coughing symptoms that has been a past ongoing issue. Caitlin Smith states that she has Caitlin sensation of food getting caught in her epigastric region. She endorses abdominal pain, right under Caitlin ribcage, for Caitlin past 4-5 months and has not seen GI. She describes it as a dull pain that will last for a few minutes before going away. She states that Caitlin abdominal pain will occur randomly at rest and has generally not been benefited by reflux medication. She endorses difficulty with bowel movement, which has been an ongoing issue in Caitlin past.I reassured Caitlin Smith that her larynx appears healthy on laryngoscopy today. I recommend NeilMed nasal saline irrigation for general nasopharyngeal hygiene. I will refer Caitlin Smith to GI for further evaluation of gastric discomfort and constipation.  I offered a referral to GI for gastric discomfort and constipation, and recommended NeilMed nasal irrigations.    October 2023: Caitlin Smith coughs consistently  with pain in Caitlin right ear.  Caitlin cough comes throughout Caitlin day without clear inciting behaviors.  She needs water to calm it down.  Caitlin Smith reports having tried reflux medication, particularly due to Caitlin fact that she also reports some burning in Caitlin throat, however this was unhelpful for her cough symptom.  Caitlin Smith also reports achiness in Caitlin right ear (Caitlin same side as her previous skull base retroauricular approach).      1. LPR  2. Slightly enlarged right Thyroid gland/ no dominant nodule  3. Burning mouth syndrome    4. Cough   5. MTD  6. Epigastric pain     We discussed interventions for chronic cough (putative neurogenic cough).  These include neuromodulator medicines (Smith currently on gabapentin and less interested in beginning another chronic medication), as well as interventions such as SLN injection, and intra laryngeal Botox.  We have decided to proceed to trial of SLN injection on Caitlin right, given Caitlin prevalence of her symptoms on Caitlin right (ear, vocal fold hypomobility)    Plan:  1.  Return for trial of right sided SLN injection (outpatient procedure clinic)    Caitlin Smith in agreement Caitlin plan as articulated above.          This note was created with [employer] and may have errors that were not dictated and not seen in editing.     Elana Alm  Eli Pattillo    Answers for HPI/ROS submitted by Caitlin Smith on 06/06/2022  Chronicity: new  Onset: more than 1 month ago  Progression since onset: unchanged  Frequency: constantly  Cough characteristics: non-productive  chest pain: Yes  headaches: Yes  heartburn: Yes  shortness of breath: Yes  wheezing: Yes  Aggravated by: lying down

## 2022-06-09 ENCOUNTER — Ambulatory Visit: Admit: 2022-06-09 | Discharge: 2022-06-10 | Payer: MEDICARE | Attending: Otolaryngology | Primary: Otolaryngology

## 2022-06-09 DIAGNOSIS — R053 Chronic cough: Principal | ICD-10-CM

## 2022-06-09 NOTE — Unmapped (Signed)
Flexible endoscope serial number  [id number]  used today by Robert Buckmire, MD

## 2022-06-10 ENCOUNTER — Ambulatory Visit: Payer: Medicare Other

## 2022-06-12 MED ORDER — VENLAFAXINE 100 MG TABLET
ORAL_TABLET | Freq: Every morning | ORAL | 11 refills | 0 days
Start: 2022-06-12 — End: ?

## 2022-06-13 MED ORDER — VENLAFAXINE 100 MG TABLET
ORAL_TABLET | Freq: Every morning | ORAL | 11 refills | 30 days | Status: CP
Start: 2022-06-13 — End: ?

## 2022-06-14 ENCOUNTER — Ambulatory Visit: Admit: 2022-06-14 | Discharge: 2022-06-15 | Payer: MEDICARE

## 2022-06-14 DIAGNOSIS — M542 Cervicalgia: Principal | ICD-10-CM

## 2022-06-14 DIAGNOSIS — M48061 Spinal stenosis, lumbar region without neurogenic claudication: Principal | ICD-10-CM

## 2022-06-14 DIAGNOSIS — M797 Fibromyalgia: Principal | ICD-10-CM

## 2022-06-14 DIAGNOSIS — G8929 Other chronic pain: Principal | ICD-10-CM

## 2022-06-14 DIAGNOSIS — M159 Polyosteoarthritis, unspecified: Principal | ICD-10-CM

## 2022-06-14 DIAGNOSIS — Z7952 Long term (current) use of systemic steroids: Principal | ICD-10-CM

## 2022-06-14 DIAGNOSIS — M545 Chronic midline low back pain: Principal | ICD-10-CM

## 2022-06-14 DIAGNOSIS — G894 Chronic pain syndrome: Principal | ICD-10-CM

## 2022-06-14 DIAGNOSIS — G629 Polyneuropathy, unspecified: Principal | ICD-10-CM

## 2022-06-14 MED ORDER — PREGABALIN 50 MG CAPSULE
ORAL_CAPSULE | Freq: Three times a day (TID) | ORAL | 1 refills | 30 days | Status: CP
Start: 2022-06-14 — End: 2023-06-14

## 2022-06-14 MED ORDER — LIDOCAINE 5 % TOPICAL OINTMENT
Freq: Two times a day (BID) | TOPICAL | 2 refills | 0 days | Status: CP
Start: 2022-06-14 — End: 2023-06-14

## 2022-06-14 MED ORDER — CYCLOBENZAPRINE 5 MG TABLET
ORAL_TABLET | Freq: Every evening | ORAL | 2 refills | 60 days | PRN
Start: 2022-06-14 — End: ?

## 2022-06-14 NOTE — Unmapped (Signed)
Chronic Pain Follow Up Note    Assessment and Plan    Caitlin Smith is a 66 y.o.  being followed at [facility] clinic for complaint of chronic pain localized to right knee and bilateral neck, shoulders and upper back myofascial pain and fibromyalgia, and lower back 2/2 lumbar facet arthropathy and DDD and moderate degree of lumbar central spinal canal stenosis. Patient has a history of chronic pain related to lupus and arthritis and is on rheumatological therapy.     Bilateral shoulder pain   Likely multifactorial, myofascial pain in setting of fibromyalgia and OA (radiographs with moderate AC OA). Denies weakness, tingling, radiculopathic pain.  - Continue Voltaren gel TID  - if pain worsening, referral to ortho for possible shoulder injections    Polyneuropathy   Likely related to known SLE.  Last hemoglobin A1c 5.7 (12/2020).  Worsened since starting PT.  No appreciable benefit from Gabapentin 300 mg TID which was restarted at last visit, she notes that even at 900mg  TID she does not recall a benefit. Discussed switching gabapentin to Lyrica, patient is interested. Last Cr of 1.04 and AST/ALT of 19/22 on 10/4. Patient endorses numbness in bilateral feet and inquired about socks that may be beneficial, TENS unit socks may be beneficial.  - Switch gabapentin to Lyrica 50mg  TID (patient is to start with 50mg  daily and slowly increase to TID if tolerating)  - Start topical lidocaine 5% BID  - Encouraged to look online for TENS unit sock to try out    Chronic pain syndrome; Fibromyalgia   Patient notes that she has run out of Flexeril at this time, which she believes was helping. She has been doing PT for the past 3 months, which she states has made her pain worsen at times but she is motivated to continue.  - Restart Flexeril 5 mg once daily PRN, refilled  - Continue vanlafaxine per PCP  - Continue Trazodone 150 mg qhs per PCP  - Continue voltaren Gel  - Continue pain psychology.  - Continue PT and activity    Chronic lower back pain; Facet Arthropathy, likely SIJ Dysfunction   Secondary to lumbar facet arthropathy and DDD and moderate degree of lumbar central spinal canal stenosis.  There also seems to be a likely component of sacroiliac joint arthropathy contributing to her pain given + Fortin's and + Faber's on exam with history of bilateral lower back pain at [facility] radiating to her buttocks. Can consider SI joint injections in the future if no significant benefit from repeat facet injections.  Patient also endorsing weakness and worsening pain after walking a couple steps which is concerning for neurogenic claudication from known central canal stenosis which may be progressively worsening. Previously had good relief with L4/5 and L5/S1 facet injections in the past and may benefit from repeat injections. Notably, patient was recently diagnosed with pulmonary fibrosis and has been on prednisone. Her rheumatologist does not want her to have any further steroids at this time. Discussed possible diagnostic block and RFA without steroids, however shared-decision making utilized to hold off on procedures at this time given patient's overall situation currently.  -Switch gabapentin to Lyrica and restart flexeril as above  -Continue Voltaren gel as above  -Consider repeat bilateral L4/L5 and L5/S1 facet injections without steroids (holding off on procedures currently given recent diagnosis of pulmonary fibrosis)  -Consider SIJ injections vs. Repeat imaging if poor relief with facet injections    Future considerations:  Genicular nerve block/RFA  Lumbar  MBBs  SIJ Injections  Repeat MRI    Requested Prescriptions     Signed Prescriptions Disp Refills    pregabalin (LYRICA) 50 MG capsule 90 capsule 1     Sig: Take 1 capsule (50 mg total) by mouth Three (3) times a day.    lidocaine (XYLOCAINE) 5 % ointment 100 g 2     Sig: Apply topically Two (2) times a day.     No orders of the defined types were placed in this encounter.    Risks and benefits of above medications including but not limited to possibility of respiratory depression, sedation, and even death were discussed with the patient who expressed an understanding.     Urine toxicology screen is not due today.  Treatment agreement renewal is not due today.     HPI  [name] is a 66 y.o. being followed at [facility] clinic for complaint of chronic pain localized to right knee and bilateral neck, shoulder, upper back, and lower back 2/2 lumbar facet arthropathy and DDD. Patient has a history of chronic pain related to lupus, arthritis, and fibromyalgia.     At last visit 12/22/21, the patient reported with numerous new and worsening pain complaints.  Primarily, patient had worsening bilateral lower back pain with radiation to her buttocks.  She denied radicular symptoms but did endorse bilateral foot numbness and tingling consistent with her known peripheral neuropathy.  Her lower back pain was worse with prolonged sitting and standing and frequently woke her up in the middle the night.  She endorsed weakness in bilateral lower extremities and some constipation but no bladder dysfunction or perineal sensory deficits.  She also endorsed bilateral knee pain (L >R) in addition to her chronic shoulder pain.  Patient reported there was an issue with her medications and she had not been able to take them for several months which had resulted in exacerbation of her pain.  She did not recall the last time she took her gabapentin or Flexeril but stated  it had not been this year.  She previously had bilateral L4/5 and L5/S1 facet injections from when she got great pain relief for couple months (patient's last office visit 02/09/2021 at which point she reported significant pain relief but was lost to follow-up and is unsure when the relief faded at today's visit)    Since last visit, she has continued to follow with Pulmonology, Pain Psychology, Rheumatology, Cards Rehab, Ophthalmology, Internal Medicine, Psychiatry, Cardiology, and ENT. The patient was admitted into the ED on 04/05/22 for complaints of chest pains and palpitations. The patient was also admitted into the ED on 04/14/22 for palpitations.     Today, patient reports being in a lot of pain. The patient reports being in PT for pulmonary lung disease which exacerbates her pain. The patient describes pulsing and aching sensation in her low back. The patient reports pain in her left knee and bilateral shoulders as well. The patient also notes might having neuropathy in her bilateral feet due in increased numbness and her feet falling asleep. The patient inquires about injections though notes she might not be able to due to being on Prednisone and her rheumatologist not approving the procedure. The patient reports canceling previous Facet Joint injection due to being on steroids. The patient notes her pain is slightly worse due to increased exercise and activity from PT. The patient notes doing weight lifting and bicycling in PT to strengthen her lungs. The patient reports following with  PT for about 3 months. The patient reports having constant numbness or her foot falling asleep and notes it feels like she could not feel the bottom of her feet. The patient notes Dr. Fayrene Fearing was supposed to send a prescription for special socks. The patient inquires about the similarities to back injections and knee injections. The patient reports intermittent improvement with breathing. The patient reports possibly having a nerve test.     In terms of medication, the patient confirms taking Gabapentin 300 mg TID, Voltaren gel prn, and Flexeril 5 mg BID prn. The patient reports no appreciable benefit from Gabapentin and slight benefit within a 15 minute window of the Voltaren gel before the pain returned to baseline. The patient has ran out of Flexeril 5 mg BID prn.    The patient's medication regimen:  Gabapentin 300 mg TID  Cymbalta 120mg  am (PCP)  Trazodone 150 mg qhs (PCP)  Flexeril 5 mg BID prn  Voltaren gel prn     Current view: Showing all answers           [facility] Return Patient Questionnaire       Question 06/13/2022  2:21 PM EDT - Filed by Patient    What is the reason for your visit?       Date of onset of your pain:       Please rate your pain at its WORST in the past month. 5    Please rate your pain at its LEAST in the past month. 5    Please rate your pain as it is RIGHT NOW. 5    Please rate your pain on AVERAGE in the past month. 5    Please circle the location of your pain.       Please select the words that describe your pain. Aching     Pressing     Pulsing     Sharp     Shock     shooting    How often do you have pain? All the time    When is your pain the worst? During the day    Which of the following have been negatively affected by your pain? Enjoyment of life     General activity     Mood     Recreational activities     Walking     Sitting     Standing    Since your last visit:     Have you had any of the following? Emergency Room visit     Primary Care Visit     Mental Health visit    Do you have any new pain you would like to discuss with your doctor? Yes    How has your pain changed? Stayed the same    Are you currently taking any blood-thinners or anticoagulants? No    If you are on Pain Medication - Are you having any of the following? Confusion     Dry mouth     Constipation     Agitation     Dizziness     Vomiting/Nausea    If you have had a procedure since your last visit, how much pain relief was obtained?       If you have had a procedure since your last visit, were there any complications?       General: Night Sweats     Fatigue     Daytime Drowsiness    Cardiovascular: Chest Pain  Heart Palpitations     Shortness of Breath with Exertion     Difficulty Breathing when Laying Flat    Gastrointestinal - (Intestinal): Nausea     Stomach Pain     Constipation     Gastric Reflux    Skin: Itching     Dryness    Endocrine (Hormonal System):       Musculoskeletal System - (Muscles, Joints and Coverings): Joint Aches/Swelling     Spasms/Spasticity/Cramps     Back pain     Muscle Aches/Weakness     Muscle Wasting    Neurologic: Dizziness/Vertigo     Coordination Difficulty     Headaches/Migraines     Numbness/Tingling    Psychiatric: Depression     Anxiety     Low Energy            Patient denies homicidal/suicidal ideation.    Medication Monitoring  [place] database was reviewed and it was appropriate.  Last urine toxicology screen: N/A, not on opioids  The patient did not bring pill bottles today, not on opioids    Imaging   MRI lumbar spine 10/28/20  FINDINGS:  There is mild lumbar dextrorotatory scoliosis centered at the L2-L3 level. Trace retrolisthesis of L5 on S1. The remaining lumbar vertebral bodies are normally aligned.      Bone marrow signal intensity is normal. The visualized cord is unremarkable and the conus medullaris ends at a normal level.     A Schmorl's node is noted within the inferior endplate of T11. Heights of the remaining vertebral bodies are preserved. There is multilevel disc desiccation. Mild loss of intervertebral disc height at L5-S1.      T12-L1 and L1-L2: No significant canal or neural foraminal stenosis. Mild facet degeneration and ligamentum flavum thickening.     L2-L3: Circumferential disc bulge with mild facet hypertrophy and ligamentum flavum thickening. No significant central canal stenosis. Mild right greater than left neural foraminal narrowing.      L3-L4: Circumferential disc bulge with mild facet arthropathy and moderate ligamentum flavum thickening. Mild central canal stenosis. Mild bilateral neural foraminal narrowing.     L4-L5: Circumferential disc bulge with a tiny, superimposed shallow central disc protrusion. Moderate facet hypertrophy and ligamentum flavum thickening. Mild central spinal canal narrowing. Mild left than right neural foraminal narrowing.     L5-S1: Mild circumferential disc bulge. Mild bilateral facet hypertrophy and ligamentum flavum thickening. No central canal stenosis. Mild bilateral neural foraminal narrowing.      The paraspinal tissues are within normal limits.     For the purposes of this dictation, the lowest well formed intervertebral disc space is assumed to be the L5-S1 level, and there are presumed to be five lumbar-type vertebral bodies.    IMPRESSION:  Multilevel degenerative lumbar spondylosis resulting in at most mild canal or central canal stenosis. Overall, findings are not significantly changed when compared to prior MRI from 2017.    Procedures  Bilateral Intra-articular Lumbar Facet Injections under Fluoroscopic Guidance at L4/5 and L5/S1with steroid 12/29/20      Allergies  Allergies   Allergen Reactions    Vilazodone Swelling    Ace Inhibitors Other (See Comments)     Pt states she can not take ace inhibitors. Pt states she can not remember her reaction.     Bee Pollen Itching     COUGH, WATERY EYES    Penicillin G Rash    Penicillins Rash    Pollen Extracts Itching  COUGHING, WATERY EYES       Home Medications    Current Outpatient Medications   Medication Sig Dispense Refill    amLODIPine (NORVASC) 2.5 MG tablet Take 3 tablets (7.5 mg total) by mouth daily.      aspirin (ECOTRIN) 81 MG tablet Take 1 tablet (81 mg total) by mouth daily.      atenoloL (TENORMIN) 25 MG tablet Take 1 tablet (25 mg total) by mouth Two (2) times a day. 180 tablet 1    atorvastatin (LIPITOR) 40 MG tablet Take 1 tablet (40 mg total) by mouth daily.      benzonatate (TESSALON PERLES) 100 MG capsule Take 1 capsule (100 mg total) by mouth every six (6) hours as needed for cough. 30 capsule 1    biotin 1 mg cap Take by mouth.      brompheniramine-pseudoephedrine-DM 2-30-10 mg/5 mL syrup       buPROPion (WELLBUTRIN XL) 300 MG 24 hr tablet Take 1 tablet (300 mg total) by mouth daily. (Patient taking differently: Take 1 tablet (300 mg total) by mouth every morning.) 90 tablet 1    clobetasoL (TEMOVATE) 0.05 % ointment Apply topically Two (2) times a week. 30 g 3    clonazePAM (KLONOPIN) 0.5 MG tablet Take 1 tablet (0.5 mg total) by mouth once as needed for anxiety or sleep for up to 15 doses. 15 tablet 0    clotrimazole-betamethasone (LOTRISONE) 1-0.05 % cream Apply 1 application. topically Two (2) times a day.      cyclobenzaprine (FLEXERIL) 5 MG tablet Take 1 tablet (5 mg total) by mouth nightly as needed for muscle spasms. 60 tablet 2    diclofenac sodium (VOLTAREN) 1 % gel Apply 2 g topically two (2) times a day as needed for arthritis. 300 g 5    dicyclomine (BENTYL) 20 mg tablet Take 1 tablet (20 mg total) by mouth daily as needed.      famotidine (PEPCID) 20 MG tablet Take 1 tablet (20 mg total) by mouth Two (2) times a day. 200 tablet 2    fluticasone propionate (FLONASE) 50 mcg/actuation nasal spray       fluticasone propionate (FLOVENT HFA) 220 mcg/actuation inhaler Inhale 1 puff Two (2) times a day. 12 g 11    hydrALAZINE (APRESOLINE) 10 MG tablet       hydrOXYchloroQUINE (PLAQUENIL) 200 mg tablet TAKE 1 TABLET BY MOUTH  TWICE DAILY 180 tablet 3    hydrOXYzine (ATARAX) 10 MG tablet       inhalational spacing device (AEROCHAMBER MV) Spcr 1 each by Miscellaneous route two (2) times a day. With Fovent 1 each 0    ketoconazole (NIZORAL) 2 % cream APP EXT AA D  0    lamoTRIgine (LAMICTAL) 200 MG tablet TAKE 1 TABLET BY MOUTH TWICE  DAILY 200 tablet 2    LINZESS 145 mcg capsule TAKE 1 CAPSULE(145 MCG) BY MOUTH DAILY 90 capsule 3    loratadine (CLARITIN) 10 mg tablet Take 1 tablet (10 mg total) by mouth daily.      meclizine (ANTIVERT) 25 mg tablet Take 1 tablet (25 mg total) by mouth Three (3) times a day as needed.      melatonin 10 mg Tab Take 1 tablet by mouth.      metFORMIN (GLUCOPHAGE-XR) 500 MG 24 hr tablet Take 1 tablet (500 mg total) by mouth daily before breakfast.      metoPROLOL succinate (TOPROL-XL) 25 MG 24 hr tablet Take 1 tablet (25 mg  total) by mouth.      mupirocin (BACTROBAN) 2 % ointment APP EXT IEN BID  0    mycophenolate (MYFORTIC) 360 MG TbEC Take 2 tablets (720 mg total) by mouth two (2) times a day. 360 tablet 3    olopatadine (PATANOL) 0.1 % ophthalmic solution Apply 1 drop to eye daily.      omeprazole (PRILOSEC) 40 MG capsule Take 1 capsule (40 mg total) by mouth two (2) times a day. 180 capsule 3    ondansetron (ZOFRAN) 4 MG tablet       polyethylene glycol (GLYCOLAX) 17 gram/dose powder       predniSONE (DELTASONE) 2.5 MG tablet Take 3 tablets (7.5 mg total) by mouth daily. 270 tablet 3    pregabalin (LYRICA) 50 MG capsule       telmisartan (MICARDIS) 80 MG tablet Take 1 tablet (80 mg total) by mouth.      traZODone (DESYREL) 100 MG tablet TAKE 1 AND 1/2 TABLETS BY  MOUTH AT NIGHT (Patient taking differently: Take 1 tablet (100 mg total) by mouth nightly.) 135 tablet 3    triamcinolone (KENALOG) 0.1 % ointment Apply twice a day to affected areas when red and itchy. Stop when improved and use a barrier cream. 80 g 1    venlafaxine (EFFEXOR) 100 MG tablet TAKE 1 TABLET BY MOUTH IN THE  MORNING 30 tablet 11    albuterol HFA 90 mcg/actuation inhaler Inhale 2 puffs every six (6) hours as needed for wheezing. 8 g 11    gabapentin (NEURONTIN) 300 MG capsule Take 1 capsule (300 mg total) by mouth Three (3) times a day. 90 capsule 2    lidocaine (XYLOCAINE) 5 % ointment Apply topically Two (2) times a day. 100 g 2    pregabalin (LYRICA) 50 MG capsule Take 1 capsule (50 mg total) by mouth Three (3) times a day. 90 capsule 1     No current facility-administered medications for this visit.     ROS  See above questionnaire.    Physical Exam    VITALS:   Vitals:    06/14/22 0836   BP: 138/84   Pulse: 61   Resp: 16   Temp: 36.3 ??C (97.4 ??F)   SpO2: 98%     Wt Readings from Last 3 Encounters:   06/14/22 (!) 110.7 kg (244 lb)   06/09/22 (!) 109.1 kg (240 lb 9.6 oz)   05/13/22 (!) 112.8 kg (248 lb 9.6 oz)     GENERAL:  The patient is well developed, well-nourished, and appears to be in no apparent distress.   HEAD/NECK:    Normocephalic/atraumatic. clear sclera, pupils not pinpoint  CV:  RR  LUNGS:   Normal work of breathing, no supplemental O2  EXTREMITIES:  No clubbing, cyanosis noted.  NEUROLOGIC:    The patient is alert and oriented, speech fluent, normal language.   MUSCULOSKELETAL:    Motor function 5/5 LLE. Good range of motion of all extremities.   GAIT:  The patient rises from a seated position with no difficulty and ambulates with nonantalgic gait without the assistance of a walking aid.   SKIN:   No obvious rashes, lesions, or erythema.  PSY:   Appropriate affect. No overt pain behaviors. No evidence of psychomotor retardation or agitation, no signs of intoxication.     Documentation assistance was provided by Darcel Smalling, on June 14, 2022 at 8:48 AM for Val Riles MS4.

## 2022-06-14 NOTE — Unmapped (Addendum)
STOP gabapentin  START Lyrica 50 mg daily , after few days increase it to 2 tabs daily, if tolerated increase to 3 tabs daily  Consider using TENS unit socks that you can find on [employer] can also use topical lidocaine on your fewet for pain relief  Use voltaren gel on painful joints up to 3 times daily  We will postpone any procedures at the present time  Continue follow up with Dr. [name]  Follow up in 2 months

## 2022-06-14 NOTE — Unmapped (Signed)
Provider discharged patient.

## 2022-06-16 ENCOUNTER — Encounter: Payer: Medicare Other | Admitting: *Deleted

## 2022-06-16 DIAGNOSIS — J849 Interstitial pulmonary disease, unspecified: Secondary | ICD-10-CM

## 2022-06-16 NOTE — Progress Notes (Signed)
Daily Session Note  Patient Details  Name: Nicole Ballard MRN: [id number] Date of Birth: [birth year] Referring Provider:   Flowsheet Row Pulmonary Rehab from 02/17/2022 in [facility]  Referring Provider Despina Arias MD       Encounter Date: 06/16/2022  Check In:  Session Check In - 06/16/22 0832       Check-In   Supervising physician immediately available to respond to emergencies See telemetry face sheet for immediately available ER MD    Location [facility]    Staff Present Alberteen Sam, MA, RCEP, CCRP, CCET;Joseph [place], [place], RN, [place]    Virtual Visit No    Medication changes reported     No    Fall or balance concerns reported    No    Warm-up and Cool-down Performed on first and last piece of equipment    Resistance Training Performed Yes    VAD Patient? No    PAD/SET Patient? No      Pain Assessment   Currently in Pain? No/denies                Social History   Tobacco Use  Smoking Status Never  Smokeless Tobacco Never    Goals Met:  Independence with exercise equipment Exercise tolerated well No report of concerns or symptoms today Strength training completed today  Goals Unmet:  Not Applicable  Comments: Pt able to follow exercise prescription today without complaint.  Will continue to monitor for progression.    Dr. Emily Filbert is Medical Director for [facility].  Dr. Ottie Glazier is Medical Director for [facility] Pulmonary Rehabilitation.

## 2022-06-16 NOTE — Unmapped (Signed)
Patient called lm on clinic vm requesting a call back regarding recent lab results. Attempted to call pt back but she did not answer and I was unable to leave a vm due to vm being full

## 2022-06-17 ENCOUNTER — Ambulatory Visit: Payer: Medicare Other | Attending: Cardiology | Admitting: Cardiology

## 2022-06-17 ENCOUNTER — Telehealth: Admit: 2022-06-17 | Discharge: 2022-06-18 | Payer: MEDICARE | Attending: Psychologist | Primary: Psychologist

## 2022-06-17 DIAGNOSIS — F331 Major depressive disorder, recurrent, moderate: Principal | ICD-10-CM

## 2022-06-17 DIAGNOSIS — F4310 Post-traumatic stress disorder, unspecified: Principal | ICD-10-CM

## 2022-06-17 DIAGNOSIS — F410 Panic disorder [episodic paroxysmal anxiety] without agoraphobia: Principal | ICD-10-CM

## 2022-06-17 DIAGNOSIS — F411 Generalized anxiety disorder: Principal | ICD-10-CM

## 2022-06-17 NOTE — Unmapped (Signed)
[facility]   Confidential Psychological Therapy Session      Patient Name: ALAYNNA LOUGHNANE  Medical Record Number: [id number]  Date of Service: June 17, 2022  Attending Psychologist: Caroline More, PhD  CPT Procedure Codes: 91478 for 60 mins of face to face counseling    This visit was performed face to face with interactive technology using a HIPPA compliant audio/visual platform. We reviewed confidentiality today. The patient was present in [place], a state in which this provider is licensed and able to provide care (location and contact information confirmed), attended this visit alone, and consented to this virtual pain psychology visit.    REFERRING PHYSICIAN: Clarene Essex, MD    CHIEF COMPLAINT AND REASON FOR VISIT: pain coping skills, CBT to address depression and anxiety in the setting of chronic pain    SUBJECTIVE / HISTORY OF PRESENT ILLNESS: Ms.  Eib is a very pleasant 66 y.o.  female from [place], [place] with multiple chronic pain complaints related to fibromyalgia and lupus who initially met with me in October 2016, and which time she was diagnosed with severe depression, PTSD, panic disorder, and generalized anxiety.The patient returns for a therapy session today. Last follow up with me was in 05/2022.     Patient reports mood to be better. She has been anxious about her health in general and particularly her cough. However she discusses how she is reassured by her pulmonologist taking a thorough approach at addressing her cough. This has been a source of anxiety and rumination - but it is improving and she will have an upcoming injection to her neck/throat which also is expected to help it further. She processes recent knowledge that her blood work is normal, particularly reassuring feedback from her nephrologist. Pt has been trying to walk and as her breathing and cough improves she would like to walk more - doing well with pulmonary rehab/finding it helpful. Spent extra time practicing mindful breathing and body scan. Retouched on her seasonal affective mood changes in light of seasons changing.     OBJECTIVE / MENTAL STATUS:    Appearance:   Appears stated age and Clean/Neat   Motor:  No abnormal movements   Speech/Language:   Normal rate, volume, tone, fluency   Mood:  Depressed and Anxious   Affect:  euthymic   Thought process:  Logical, linear, clear, coherent, goal directed   Thought content:    Denies SI, HI, self harm, delusions, obsessions, paranoid ideation, or ideas of reference   Perceptual disturbances:    Denies auditory and visual hallucinations, behavior not concerning for response to internal stimuli   Orientation:  Oriented to person, place, time, and general circumstances   Attention:  Able to fully attend without fluctuations in consciousness   Concentration:  Able to fully concentrate and attend   Memory:  Immediate, short-term, long-term, and recall grossly intact    Fund of knowledge:   Consistent with level of education and development   Insight:    Fair   Judgment:   Intact   Impulse Control:  Intact     DIAGNOSTIC IMPRESSION:   Post Traumatic Stress Disorder (PTSD)  Generalized Anxiety Disorder (GAD)  Panic Disorder  Major Depressive Disorder, moderate, recurrent  Chronic pain syndrome  Fibromyalgia  Lupus    ASSESSMENT:   Ms.  Etheridge is a very pleasant 66 y.o.  female from [place], [place] with multiple chronic pain complaints related to lupus, arthritis, and fibromyalgia. She was previously seen  in our clinic from 2012-2014, and reestablished care with Dr. Fayrene Fearing in August 2016. The patient has struggled with depression and anxiety, but is very motivated to participate in intensive, multidisciplinary treatment to address the connection between depression, anxiety and pain. She has a long-standing history of depression and anxiety, and has worked with outpatient psychiatrist and therapist over the years. She is currently established with [facility] psychiatry.  In general, depression, anxiety, pain coping and panic have all improved (until recent bout with pulmonary issues), but the patient evidences a seasonal affective pattern of mood changes typically her mood tends to be worse during the winter.      PLAN:   (1) Psychotherapy - Continue CBT. CBT will be used to address PTSD, MDD, Panic D/o, GAD, and chronic pain.     --Behavioral Activation - Encouraged behavioral activation.  Is trying.  --Downloaded and uses Insight Timer, guided relaxation app, for nightly practice.  --Continues to utilize diaphragmatic breathing daily  --encouraged early morning sunlight / light therapy for possible SAD in the setting of chronic MDD    (2) Psychiatry - Pt currently established with [facility]. Has established with a new resident and is followed by Dr. Evette Cristal (attending)    (3) Safety - Pt denies any current or recent SI or safety concerns. Knows to call 911 or go to her local ED. Also previously given [facility].      (4) follow-up - with me in 1 month

## 2022-06-17 NOTE — Unmapped (Signed)
[facility] Refill Coordination Note    Specialty Medication(s) to be Shipped:   Inflammatory Disorders: mycophenolate    Other medication(s) to be shipped: No additional medications requested for fill at this time     GAYNOR SANTORI, DOB: 1956-02-11  Phone: [phone] (home)       All above HIPAA information was verified with patient.     Was a Nurse, learning disability used for this call? No    Completed refill call assessment today to schedule patient's medication shipment from the [facility] [phone]).  All relevant notes have been reviewed.     Specialty medication(s) and dose(s) confirmed: Regimen is correct and unchanged.   Changes to medications: Laurelee reports no changes at this time.  Changes to insurance: No  New side effects reported not previously addressed with a pharmacist or physician: None reported  Questions for the pharmacist: No    Confirmed patient received a Conservation officer, historic buildings and a Surveyor, mining with first shipment. The patient will receive a drug information handout for each medication shipped and additional FDA Medication Guides as required.       DISEASE/MEDICATION-SPECIFIC INFORMATION        N/A    SPECIALTY MEDICATION ADHERENCE     Medication Adherence    Patient reported X missed doses in the last month: 0  Specialty Medication: mycophenolate  Patient is on additional specialty medications: No  Patient is on more than two specialty medications: No  Any gaps in refill history greater than 2 weeks in the last 3 months: no  Demonstrates understanding of importance of adherence: yes  Informant: patient                          Were doses missed due to medication being on hold? No    Mycophenolate 360mg : Patient has 10 days of medication on hand    REFERRAL TO PHARMACIST     Referral to the pharmacist: Not needed      [facility]     Shipping address confirmed in Epic.     Delivery Scheduled: Yes, Expected medication delivery date: 10/19.     Medication will be delivered via Next Day Courier to the prescription address in Epic WAM.    Olga Millers   [facility] Specialty Technician

## 2022-06-17 NOTE — Unmapped (Signed)
This was a telehealth service where a resident was involved. I was immediately available via telephone/pager. I have reviewed the resident's note and I am in agreement with the assessment and plan.    Zelphia Cairo, MD

## 2022-06-20 ENCOUNTER — Encounter: Payer: Self-pay | Admitting: Cardiology

## 2022-06-20 MED ORDER — CYCLOBENZAPRINE 5 MG TABLET
ORAL_TABLET | Freq: Every evening | ORAL | 2 refills | 60 days | Status: CP | PRN
Start: 2022-06-20 — End: ?

## 2022-06-21 ENCOUNTER — Encounter: Payer: Medicare Other | Admitting: *Deleted

## 2022-06-21 DIAGNOSIS — J849 Interstitial pulmonary disease, unspecified: Secondary | ICD-10-CM | POA: Diagnosis not present

## 2022-06-21 NOTE — Progress Notes (Signed)
Daily Session Note  Patient Details  Name: GRAYCEN DEGAN MRN: [id number] Date of Birth: 03-16-1956 Referring Provider:   Flowsheet Row Pulmonary Rehab from 02/17/2022 in [facility] Re[facility]  Referring Provider Despina Arias MD       Encounter Date: 06/21/2022  Check In:  Session Check In - 06/21/22 0843       Check-In   Supervising physician immediately available to respond to emergencies See telemetry face sheet for immediately available ER MD    Location [facility]-Cardiac & Pulmonary Rehab    Staff Present Heath Lark, RN, BSN, CCRP;Jessica Bemiss, MA, RCEP, CCRP, Marylynn Pearson, MS, ASCM CEP, Exercise Physiologist    Virtual Visit No    Medication changes reported     No    Fall or balance concerns reported    No    Warm-up and Cool-down Performed on first and last piece of equipment    Resistance Training Performed Yes    VAD Patient? No    PAD/SET Patient? No      Pain Assessment   Currently in Pain? No/denies                Social History   Tobacco Use  Smoking Status Never  Smokeless Tobacco Never    Goals Met:  Proper associated with RPD/PD & O2 Sat Independence with exercise equipment Exercise tolerated well No report of concerns or symptoms today  Goals Unmet:  Not Applicable  Comments: Pt able to follow exercise prescription today without complaint.  Will continue to monitor for progression.    Dr. Emily Filbert is Medical Director for [facility].  Dr. Ottie Glazier is Medical Director for [facility] Pulmonary Rehabilitation.

## 2022-06-22 MED ORDER — CLONAZEPAM 0.5 MG TABLET
ORAL_TABLET | Freq: Once | ORAL | 0 refills | 0 days | PRN
Start: 2022-06-22 — End: ?

## 2022-06-22 MED FILL — MYCOPHENOLATE SODIUM 360 MG TABLET,DELAYED RELEASE: ORAL | 90 days supply | Qty: 360 | Fill #3

## 2022-06-23 ENCOUNTER — Encounter: Payer: Medicare Other | Admitting: *Deleted

## 2022-06-23 ENCOUNTER — Ambulatory Visit
Admit: 2022-06-23 | Payer: MEDICARE | Attending: Student in an Organized Health Care Education/Training Program | Primary: Student in an Organized Health Care Education/Training Program

## 2022-06-23 DIAGNOSIS — J849 Interstitial pulmonary disease, unspecified: Secondary | ICD-10-CM

## 2022-06-23 MED ORDER — CLONAZEPAM 0.5 MG TABLET
ORAL_TABLET | Freq: Once | ORAL | 0 refills | 0 days | Status: CP | PRN
Start: 2022-06-23 — End: ?

## 2022-06-23 NOTE — Progress Notes (Signed)
Daily Session Note  Patient Details  Name: MERARY GARGUILO MRN: [id number] Date of Birth: 1955/09/06 Referring Provider:   Flowsheet Row Pulmonary Rehab from 02/17/2022 in [facility] Cardiac and Pulmonary Rehab  Referring Provider Despina Arias MD       Encounter Date: 06/23/2022  Check In:  Session Check In - 06/23/22 0846       Check-In   Supervising physician immediately available to respond to emergencies See telemetry face sheet for immediately available ER MD    Location [facility]-Cardiac & Pulmonary Rehab    Staff Present Heath Lark, RN, BSN, Jacklynn Bue, MS, ASCM CEP, Exercise Physiologist;Jessica Rainbow, MA, RCEP, CCRP, CCET;Joseph [place], [place]    Virtual Visit No    Medication changes reported     No    Fall or balance concerns reported    No    Warm-up and Cool-down Performed on first and last piece of equipment    Resistance Training Performed Yes    VAD Patient? No    PAD/SET Patient? No      Pain Assessment   Currently in Pain? No/denies                Social History   Tobacco Use  Smoking Status Never  Smokeless Tobacco Never    Goals Met:  Proper associated with RPD/PD & O2 Sat Independence with exercise equipment Exercise tolerated well No report of concerns or symptoms today  Goals Unmet:  Not Applicable  Comments: Pt able to follow exercise prescription today without complaint.  Will continue to monitor for progression.    Dr. Emily Filbert is Medical Director for [facility].  Dr. Ottie Glazier is Medical Director for [facility] Pulmonary Rehabilitation.

## 2022-06-27 NOTE — Unmapped (Signed)
You are very welcome, trying to get use this new way of system :)

## 2022-06-27 NOTE — Unmapped (Signed)
Yes ma'am we got you .Marland Kitchen Thanks :)

## 2022-06-28 ENCOUNTER — Encounter: Payer: Medicare Other | Admitting: *Deleted

## 2022-06-28 ENCOUNTER — Encounter: Payer: Self-pay | Admitting: Family Medicine

## 2022-06-28 DIAGNOSIS — J849 Interstitial pulmonary disease, unspecified: Secondary | ICD-10-CM

## 2022-06-28 DIAGNOSIS — R051 Acute cough: Principal | ICD-10-CM

## 2022-06-28 NOTE — Progress Notes (Signed)
Daily Session Note  Patient Details  Name: Nicole Ballard MRN: [id number] Date of Birth: 1956/05/09 Referring Provider:   Flowsheet Row Pulmonary Rehab from 02/17/2022 in [facility] Card[facility] and Pulmonary Rehab  Referring Provider Despina Arias MD       Encounter Date: 06/28/2022  Check In:  Session Check In - 06/28/22 0759       Check-In   Supervising physician immediately available to respond to emergencies See telemetry face sheet for immediately available ER MD    Location [facility]-Cardiac & Pulmonary Rehab    Staff Present Heath Lark, RN, BSN, CCRP;Jessica Tavernier, MA, RCEP, CCRP, Marylynn Pearson, MS, ASCM CEP, Exercise Physiologist    Virtual Visit No    Medication changes reported     No    Fall or balance concerns reported    No    Warm-up and Cool-down Performed on first and last piece of equipment    Resistance Training Performed Yes    VAD Patient? No    PAD/SET Patient? No      Pain Assessment   Currently in Pain? No/denies                Social History   Tobacco Use  Smoking Status Never  Smokeless Tobacco Never    Goals Met:  Proper associated with RPD/PD & O2 Sat Independence with exercise equipment Exercise tolerated well No report of concerns or symptoms today  Goals Unmet:  Not Applicable  Comments: Pt able to follow exercise prescription today without complaint.  Will continue to monitor for progression.    Dr. Emily Filbert is Medical Director for [facility].  Dr. Ottie Glazier is Medical Director for [facility] Pulmonary Rehabilitation.

## 2022-06-29 ENCOUNTER — Ambulatory Visit: Admit: 2022-06-29 | Discharge: 2022-06-30 | Payer: MEDICAID

## 2022-06-29 LAB — CBC
HEMATOCRIT: 41.7 % (ref 34.0–44.0)
HEMOGLOBIN: 13.6 g/dL (ref 11.3–14.9)
MEAN CORPUSCULAR HEMOGLOBIN CONC: 32.5 g/dL (ref 32.0–36.0)
MEAN CORPUSCULAR HEMOGLOBIN: 27.3 pg (ref 25.9–32.4)
MEAN CORPUSCULAR VOLUME: 84.1 fL (ref 77.6–95.7)
MEAN PLATELET VOLUME: 7.5 fL (ref 6.8–10.7)
PLATELET COUNT: 320 10*9/L (ref 150–450)
RED BLOOD CELL COUNT: 4.96 10*12/L (ref 3.95–5.13)
RED CELL DISTRIBUTION WIDTH: 14.3 % (ref 12.2–15.2)
WBC ADJUSTED: 4.7 10*9/L (ref 3.6–11.2)

## 2022-06-29 LAB — BASIC METABOLIC PANEL
ANION GAP: 7 mmol/L (ref 5–14)
BLOOD UREA NITROGEN: 13 mg/dL (ref 9–23)
BUN / CREAT RATIO: 14
CALCIUM: 9.4 mg/dL (ref 8.7–10.4)
CHLORIDE: 108 mmol/L — ABNORMAL HIGH (ref 98–107)
CO2: 28.9 mmol/L (ref 20.0–31.0)
CREATININE: 0.91 mg/dL
EGFR CKD-EPI (2021) FEMALE: 71 mL/min/{1.73_m2} (ref >=60)
GLUCOSE RANDOM: 96 mg/dL (ref 70–179)
POTASSIUM: 4.6 mmol/L (ref 3.4–4.8)
SODIUM: 144 mmol/L (ref 135–145)

## 2022-07-02 ENCOUNTER — Other Ambulatory Visit: Payer: Self-pay | Admitting: Family Medicine

## 2022-07-02 DIAGNOSIS — E88819 Insulin resistance, unspecified: Secondary | ICD-10-CM

## 2022-07-04 ENCOUNTER — Other Ambulatory Visit: Payer: Self-pay | Admitting: Family Medicine

## 2022-07-04 ENCOUNTER — Ambulatory Visit: Payer: Medicare Other

## 2022-07-04 ENCOUNTER — Telehealth: Payer: Self-pay | Admitting: Family Medicine

## 2022-07-04 ENCOUNTER — Encounter: Payer: Self-pay | Admitting: Family Medicine

## 2022-07-04 DIAGNOSIS — F339 Major depressive disorder, recurrent, unspecified: Secondary | ICD-10-CM

## 2022-07-04 NOTE — Telephone Encounter (Signed)
Copied from [place] [id number]. Topic: Referral - Request for Referral >> Jul 04, 2022  9:35 AM Sabas Sous wrote: Has patient seen PCP for this complaint? Yes.   *If NO, is insurance requiring patient see PCP for this issue before PCP can refer them? Referral for which specialty: Psychiatry  Preferred provider/office: [facility], is upset that the current places she has seen for years constantly has her changing providers.  Reason for referral: Seeking counseling for emotional wellness, wants medication

## 2022-07-05 ENCOUNTER — Telehealth: Payer: Self-pay | Admitting: Family Medicine

## 2022-07-05 ENCOUNTER — Telehealth: Admit: 2022-07-05 | Discharge: 2022-07-06 | Payer: MEDICAID | Attending: Psychologist | Primary: Psychologist

## 2022-07-05 DIAGNOSIS — F4310 Post-traumatic stress disorder, unspecified: Principal | ICD-10-CM

## 2022-07-05 DIAGNOSIS — F410 Panic disorder [episodic paroxysmal anxiety] without agoraphobia: Principal | ICD-10-CM

## 2022-07-05 DIAGNOSIS — F331 Major depressive disorder, recurrent, moderate: Principal | ICD-10-CM

## 2022-07-05 DIAGNOSIS — F411 Generalized anxiety disorder: Principal | ICD-10-CM

## 2022-07-05 NOTE — Telephone Encounter (Unsigned)
Copied from [place] [phone]. Topic: General - Other >> Jul 05, 2022 11:40 AM Ludger Nutting wrote: Arbie Cookey with [facility] called and stated that they received the request for medical records for the patient, however patient has not been seen there.

## 2022-07-05 NOTE — Unmapped (Signed)
[facility]   Confidential Psychological Therapy Session      Patient Name: Caitlin Smith  Medical Record Number: [id number]  Date of Service: July 05, 2022  Attending Psychologist: Caroline More, PhD  CPT Procedure Codes: 81191 for 60 mins of face to face counseling    This visit was performed face to face with interactive technology using a HIPPA compliant audio/visual platform. We reviewed confidentiality today. The patient was present in [place], a state in which this provider is licensed and able to provide care (location and contact information confirmed), attended this visit alone, and consented to this virtual pain psychology visit.    REFERRING PHYSICIAN: Clarene Essex, MD    CHIEF COMPLAINT AND REASON FOR VISIT: pain coping skills, CBT to address depression and anxiety in the setting of chronic pain    SUBJECTIVE / HISTORY OF PRESENT ILLNESS: Caitlin Smith is a very pleasant 66 y.o.  female from [place], [place] with multiple chronic pain complaints related to fibromyalgia and lupus who initially met with me in October 2016, and which time she was diagnosed with severe depression, PTSD, panic disorder, and generalized anxiety.The patient returns for a therapy session today. Last follow up with me was in 06/2022.     The patient was added on today, for a full 60-minute psychotherapy session, after messaging me via MyChart with concerns related to depression and her psychiatric medications.  The patient explains that depression symptoms have worsened.  She attributes this in part to changes in health.  She had problems with breathing persistent cough associated with her asthma earlier this year.  Breathing is now improved, but she did not respond to voice therapy, and her ENT communicated that the next step would be injection to her neck, which may or may not be helpful.  The patient processes thoughts and feelings about health stressors utilizing a mindfulness approach today.  She missed her last appointment with her psychiatrist due to confusion about appointment time.  She thought it was rescheduled for next month, but when I looked via epic, no appointments were made.  The patient states she was told to look into psychotherapy, in the event that her depression symptoms are affected by psychosocial stressors.  I explained how individuals process and perceive stressors, affect thoughts feelings, physiological reactions and responses, and behaviors.  In retouching on the CBT model today, we connected her thoughts, feelings, and behaviors.  On the positive side she is staying very active walking daily.  She does pulmonary rehab formally twice a week and walks the other days.  She enjoys this.  Now that her breathing and cough has improved, she is able to do it more regularly.  In addition to behavioral activation, we reviewed the importance of behavioral relaxation strategies to help with anxiety and calming particularly in the evening hours as she experiences some chronic sleep concerns.  Spent extra time today practicing mindful breathing and body scan and reflecting on this practice.  We also discussed communication strategies and how she can communicate her needs to her psychiatrist in a more effective manner.  She denies any SI or safety concerns today or any time in the recent past.  We reviewed safety planning in the event that she does experience any psychiatric crisis or emergency.  She acknowledged understanding and agreed.  We reviewed her medications and how she is taking them and I provided some clarification based on documentation in her chart.  She expressed appreciation.  She plans to follow-up with her psychiatrist asked to schedule an appointment.  I agreed to meet with her more frequently, every other week (this is much as my schedule permits), and we will of her finding a new therapist via psychology today.  She expressed appreciation.     Spent extra time practicing mindful breathing and body scan.    OBJECTIVE / MENTAL STATUS:    Appearance:   Appears stated age and Clean/Neat   Motor:  No abnormal movements   Speech/Language:   Normal rate, volume, tone, fluency   Mood:  Depressed and Anxious   Affect:  euthymic   Thought process:  Logical, linear, clear, coherent, goal directed   Thought content:    Denies SI, HI, self harm, delusions, obsessions, paranoid ideation, or ideas of reference   Perceptual disturbances:    Denies auditory and visual hallucinations, behavior not concerning for response to internal stimuli   Orientation:  Oriented to person, place, time, and general circumstances   Attention:  Able to fully attend without fluctuations in consciousness   Concentration:  Able to fully concentrate and attend   Memory:  Immediate, short-term, long-term, and recall grossly intact    Fund of knowledge:   Consistent with level of education and development   Insight:    Fair   Judgment:   Intact   Impulse Control:  Intact     DIAGNOSTIC IMPRESSION:   Post Traumatic Stress Disorder (PTSD)  Generalized Anxiety Disorder (GAD)  Panic Disorder  Major Depressive Disorder, moderate, recurrent  Chronic pain syndrome  Fibromyalgia  Lupus    ASSESSMENT:   Caitlin Smith is a very pleasant 66 y.o.  female from [place], [place] with multiple chronic pain complaints related to lupus, arthritis, and fibromyalgia. She was previously seen in our clinic from 2012-2014, and reestablished care with Dr. Fayrene Fearing in August 2016. The patient has struggled with depression and anxiety, but is very motivated to participate in intensive, multidisciplinary treatment to address the connection between depression, anxiety and pain. She has a long-standing history of depression and anxiety, and has worked with outpatient psychiatrist and therapist over the years. She is currently established with [facility] psychiatry.  In general, depression, anxiety, pain coping and panic have all improved (until recent bout with pulmonary issues), but the patient evidences a seasonal affective pattern of mood changes typically her mood tends to be worse during the winter.      PLAN:   (1) Psychotherapy - Continue CBT. CBT will be used to address PTSD, MDD, Panic D/o, GAD, and chronic pain.     --Behavioral Activation - Encouraged behavioral activation.  Is trying.  --Downloaded and uses Insight Timer, guided relaxation app, for nightly practice.  --Continues to utilize diaphragmatic breathing daily  --encouraged early morning sunlight / light therapy for possible SAD in the setting of chronic MDD    (2) Psychiatry - Pt currently established with [facility].  Is followed by Dr. Evette Cristal (attending) but typically has appointments with a psychiatrist resident.    (3) Safety - Pt denies any current or recent SI or safety concerns. Knows to call 911 or go to her local ED. Also previously given [facility].      (4) follow-up - with me in 2 weeks

## 2022-07-06 ENCOUNTER — Other Ambulatory Visit: Payer: Self-pay | Admitting: Family Medicine

## 2022-07-06 ENCOUNTER — Encounter: Payer: Self-pay | Admitting: *Deleted

## 2022-07-06 DIAGNOSIS — F339 Major depressive disorder, recurrent, unspecified: Secondary | ICD-10-CM

## 2022-07-06 DIAGNOSIS — J849 Interstitial pulmonary disease, unspecified: Secondary | ICD-10-CM

## 2022-07-06 NOTE — Progress Notes (Signed)
Pulmonary Individual Treatment Plan  Patient Details  Name: Nicole Ballard MRN: [id number] Date of Birth: 05-30-1956 Referring Provider:   Flowsheet Row Pulmonary Rehab from 02/17/2022 in [facility] Cardiac and Pulmonary Rehab  Referring Provider Despina Arias MD       Initial Encounter Date:  Flowsheet Row Pulmonary Rehab from 02/17/2022 in [facility] Cardiac and Pulmonary Rehab  Date 02/17/22       Visit Diagnosis: ILD (interstitial lung disease) ([place])  Patient's Home Medications on Admission:  Current Outpatient Medications:    albuterol (VENTOLIN HFA) 108 (90 Base) MCG/ACT inhaler, SMARTSIG:2 Puff(s) By Mouth Every 6 Hours PRN, Disp: , Rfl:    amLODipine (NORVASC) 2.5 MG tablet, TAKE 3 TABLETS BY MOUTH DAILY, Disp: 270 tablet, Rfl: 3   aspirin EC 81 MG tablet, Take 1 tablet (81 mg total) by mouth daily., Disp: 30 tablet, Rfl: 0   atenolol (TENORMIN) 25 MG tablet, Take 1 tablet by mouth in the morning and at bedtime., Disp: , Rfl:    atorvastatin (LIPITOR) 40 MG tablet, TAKE 1 TABLET BY MOUTH  DAILY, Disp: 90 tablet, Rfl: 3   Biotin 1 MG CAPS, Take 1 capsule by mouth daily., Disp: 30 capsule, Rfl: 0   buPROPion (WELLBUTRIN XL) 300 MG 24 hr tablet, Take 300 mg by mouth in the morning., Disp: , Rfl:    Cholecalciferol (VITAMIN D) 2000 UNITS CAPS, Take 1 capsule by mouth daily., Disp: , Rfl:    clobetasol ointment (TEMOVATE) 4.09 %, Apply 1 application topically 2 (two) times daily., Disp: , Rfl:    clonazePAM (KLONOPIN) 0.5 MG tablet, Take 0.25 mg by mouth at bedtime., Disp: , Rfl:    clotrimazole-betamethasone (LOTRISONE) cream, Apply 1 application topically 2 (two) times daily., Disp: 45 g, Rfl: 0   diclofenac Sodium (VOLTAREN) 1 % GEL, Apply topically daily as needed., Disp: , Rfl:    famotidine (PEPCID) 20 MG tablet, Take by mouth., Disp: , Rfl:    fluticasone (FLONASE) 50 MCG/ACT nasal spray, Place 2 sprays into both nostrils daily., Disp: 48 g, Rfl: 3   gabapentin (NEURONTIN) 300  MG capsule, Take 300 mg (1 capsule) in the morning and at 300 mg (1 capsule) at noon and 600 mg (2 capsules) at night., Disp: , Rfl:    hydroxychloroquine (PLAQUENIL) 200 MG tablet, Take by mouth 2 (two) times daily., Disp: , Rfl:    hydrOXYzine (ATARAX) 10 MG tablet, Take 1 tablet (10 mg total) by mouth 3 (three) times daily as needed., Disp: 30 tablet, Rfl: 0   lamoTRIgine (LAMICTAL) 200 MG tablet, Take 400 mg by mouth at bedtime., Disp: , Rfl:    LINZESS 145 MCG CAPS capsule, Take 145 mcg by mouth daily., Disp: , Rfl:    loratadine (CLARITIN) 10 MG tablet, Take 1 tablet (10 mg total) by mouth daily., Disp: 90 tablet, Rfl: 1   Melatonin 10 MG TABS, Take 1 tablet by mouth at bedtime., Disp: , Rfl:    metFORMIN (GLUCOPHAGE-XR) 500 MG 24 hr tablet, TAKE 1 TABLET(500 MG) BY MOUTH DAILY WITH BREAKFAST, Disp: 90 tablet, Rfl: 1   metoprolol succinate (TOPROL-XL) 25 MG 24 hr tablet, Take 1 tablet (25 mg total) by mouth daily. In place of Atenolol, Disp: 30 tablet, Rfl: 0   mycophenolate (MYFORTIC) 360 MG TBEC EC tablet, Take 720 mg by mouth 2 (two) times daily., Disp: , Rfl:    olopatadine (PATANOL) 0.1 % ophthalmic solution, Place 1 drop into both eyes 2 (two) times daily., Disp: 5 mL,  Rfl: 1   omeprazole (PRILOSEC) 40 MG capsule, Take 1 capsule by mouth daily at 12 noon., Disp: , Rfl:    ondansetron (ZOFRAN) 4 MG tablet, Take 1 tablet (4 mg total) by mouth every 8 (eight) hours as needed for nausea or vomiting., Disp: 20 tablet, Rfl: 0   Polyethylene Glycol 3350 (MIRALAX PO), Take by mouth., Disp: , Rfl:    predniSONE (DELTASONE) 2.5 MG tablet, Take 1 tablet by mouth daily at 12 noon., Disp: , Rfl:    telmisartan (MICARDIS) 80 MG tablet, TAKE 1 TABLET BY MOUTH DAILY, Disp: 90 tablet, Rfl: 3   traZODone (DESYREL) 100 MG tablet, Take 150 mg by mouth at bedtime., Disp: , Rfl:    triamcinolone ointment (KENALOG) 0.1 %, , Disp: , Rfl:    venlafaxine (EFFEXOR) 100 MG tablet, Take 100 mg by mouth every  evening., Disp: , Rfl:   Past Medical History: Past Medical History:  Diagnosis Date   Anxiety    Bipolar 1 disorder ([place])    Chronic kidney disease    Chronic pain    Diabetes mellitus without complication (HCC)    Fibromyalgia    GERD (gastroesophageal reflux disease)    Hearing loss of right ear    Hemifacial spasm    Hyperlipidemia    Hypertension    Insomnia    Lumbago    Osteoarthritis    Paresthesia    Rhinitis    Systemic lupus erythematosus ([place])    Vitamin D deficiency     Tobacco Use: Social History   Tobacco Use  Smoking Status Never  Smokeless Tobacco Never    Labs: Review Flowsheet  More data exists      Latest Ref Rng & Units 10/09/2019 02/14/2020 06/15/2020 12/14/2020 06/15/2021  Labs for ITP Cardiac and Pulmonary Rehab  Cholestrol <200 mg/dL - - 151  - 154   LDL (calc) mg/dL (calc) - - 74  - 75   HDL-C > OR = 50 mg/dL - - 61  - 62   Trlycerides <150 mg/dL - - 78  - 89   Hemoglobin A1c <5.7 % of total Hgb 5.5  5.6  5.5  5.7  5.5      Pulmonary Assessment Scores:  Pulmonary Assessment Scores     Row Name 02/17/22 1115         ADL UCSD   ADL Phase Entry     SOB Score total 43     Rest 1     Walk 2     Stairs 3     Bath 1     Dress 3     Shop 2       CAT Score   CAT Score 26       mMRC Score   mMRC Score 2              UCSD: Self-administered rating of dyspnea associated with activities of daily living (ADLs) 6-point scale (0 = "not at all" to 5 = "maximal or unable to do because of breathlessness")  Scoring Scores range from 0 to 120.  Minimally important difference is 5 units  CAT: CAT can identify the health impairment of COPD patients and is better correlated with disease progression.  CAT has a scoring range of zero to 40. The CAT score is classified into four groups of low (less than 10), medium (10 - 20), high (21-30) and very high (31-40) based on the impact level of disease on health status. A  CAT score over 10  suggests significant symptoms.  A worsening CAT score could be explained by an exacerbation, poor medication adherence, poor inhaler technique, or progression of COPD or comorbid conditions.  CAT MCID is 2 points  mMRC: mMRC (Modified Medical Research Council) Dyspnea Scale is used to assess the degree of baseline functional disability in patients of respiratory disease due to dyspnea. No minimal important difference is established. A decrease in score of 1 point or greater is considered a positive change.   Pulmonary Function Assessment:  Pulmonary Function Assessment - 02/17/22 1115       Breath   Shortness of Breath Yes;Panic with Shortness of Breath;Fear of Shortness of Breath;Limiting activity             Exercise Target Goals: Exercise Program Goal: Individual exercise prescription set using results from initial 6 min walk test and THRR while considering  patient's activity barriers and safety.   Exercise Prescription Goal: Initial exercise prescription builds to 30-45 minutes a day of aerobic activity, 2-3 days per week.  Home exercise guidelines will be given to patient during program as part of exercise prescription that the participant will acknowledge.  Education: Aerobic Exercise: - Group verbal and visual presentation on the components of exercise prescription. Introduces F.I.T.T principle from ACSM for exercise prescriptions.  Reviews F.I.T.T. principles of aerobic exercise including progression. Written material given at graduation. Flowsheet Row Pulmonary Rehab from 06/16/2022 in [facility] Cardiac and Pulmonary Rehab  Date 03/10/22  Educator [facility]  Instruction Review Code 1- Verbalizes Understanding       Education: Resistance Exercise: - Group verbal and visual presentation on the components of exercise prescription. Introduces F.I.T.T principle from ACSM for exercise prescriptions  Reviews F.I.T.T. principles of resistance exercise including progression. Written  material given at graduation.    Education: Exercise & Equipment Safety: - Individual verbal instruction and demonstration of equipment use and safety with use of the equipment. Flowsheet Row Pulmonary Rehab from 06/16/2022 in [facility] Cardiac and Pulmonary Rehab  Date 02/17/22  Educator [facility]  Instruction Review Code 1- Verbalizes Understanding       Education: Exercise Physiology & General Exercise Guidelines: - Group verbal and written instruction with models to review the exercise physiology of the cardiovascular system and associated critical values. Provides general exercise guidelines with specific guidelines to those with heart or lung disease.  Flowsheet Row Pulmonary Rehab from 06/16/2022 in [facility] Cardiac and Pulmonary Rehab  Date 03/03/22  Educator NT  Instruction Review Code 1- [employer] Understanding       Education: Flexibility, Balance, Mind/Body Relaxation: - Group verbal and visual presentation with interactive activity on the components of exercise prescription. Introduces F.I.T.T principle from ACSM for exercise prescriptions. Reviews F.I.T.T. principles of flexibility and balance exercise training including progression. Also discusses the mind body connection.  Reviews various relaxation techniques to help reduce and manage stress (i.e. Deep breathing, progressive muscle relaxation, and visualization). Balance handout provided to take home. Written material given at graduation.   Activity Barriers & Risk Stratification:  Activity Barriers & Cardiac Risk Stratification - 02/17/22 1048       Activity Barriers & Cardiac Risk Stratification   Activity Barriers Right Knee Replacement;Muscular Weakness;Deconditioning;Balance Concerns;Shortness of Breath             6 Minute Walk:  6 Minute Walk     Row Name 02/17/22 1045         6 Minute Walk   Phase Initial     Distance 1320  feet     Walk Time 6 minutes     # of Rest Breaks 0     MPH 2.5     METS 2.72      RPE 11     Perceived Dyspnea  1     VO2 Peak 9.52     Symptoms Yes (comment)     Comments SOB, dizzy on turns     Resting HR 68 bpm     Resting BP 136/64     Resting Oxygen Saturation  96 %     Exercise Oxygen Saturation  during 6 min walk 85 %     Max Ex. HR 103 bpm     Max Ex. BP 134/72     2 Minute Post BP 132/62       Interval HR   1 Minute HR 86     2 Minute HR 95     3 Minute HR 103     4 Minute HR 102     5 Minute HR 102     6 Minute HR 103     2 Minute Post HR 73     Interval Heart Rate? Yes       Interval Oxygen   Interval Oxygen? Yes     Baseline Oxygen Saturation % 96 %     1 Minute Oxygen Saturation % 89 %     1 Minute Liters of Oxygen 0 L  Room Air     2 Minute Oxygen Saturation % 87 %     2 Minute Liters of Oxygen 0 L     3 Minute Oxygen Saturation % 86 %     3 Minute Liters of Oxygen 0 L     4 Minute Oxygen Saturation % 85 %     4 Minute Liters of Oxygen 0 L     5 Minute Oxygen Saturation % 87 %     5 Minute Liters of Oxygen 0 L     6 Minute Oxygen Saturation % 86 %     6 Minute Liters of Oxygen 0 L     2 Minute Post Oxygen Saturation % 95 %     2 Minute Post Liters of Oxygen 0 L             Oxygen Initial Assessment:  Oxygen Initial Assessment - 02/17/22 1115       Home Oxygen   Home Oxygen Device None    Sleep Oxygen Prescription None    Home Exercise Oxygen Prescription None    Home Resting Oxygen Prescription None    Compliance with Home Oxygen Use Yes      Initial 6 min Walk   Oxygen Used None      Program Oxygen Prescription   Program Oxygen Prescription None      Intervention   Short Term Goals To learn and demonstrate proper pursed lip breathing techniques or other breathing techniques. ;To learn and demonstrate proper use of respiratory medications;To learn and understand importance of maintaining oxygen saturations>88%;To learn and understand importance of monitoring SPO2 with pulse oximeter and demonstrate accurate use of the  pulse oximeter.    Long  Term Goals Exhibits proper breathing techniques, such as pursed lip breathing or other method taught during program session;Demonstrates proper use of MDI's;Compliance with respiratory medication;Maintenance of O2 saturations>88%;Verbalizes importance of monitoring SPO2 with pulse oximeter and return demonstration             Oxygen Re-Evaluation:  Oxygen Re-Evaluation     Row Name 02/22/22 0753 03/15/22 0826 03/29/22 0818 04/19/22 0830 05/24/22 0827     Program Oxygen Prescription   Program Oxygen Prescription _0      Home Oxygen   Home Oxygen Device _1    Sleep Oxygen Prescription _2    Home Exercise Oxygen Prescription _3    Home Resting Oxygen Prescription _4    Compliance with Home Oxygen Use _5      Goals/Expected Outcomes   Short Term Goals To learn and demonstrate proper pursed lip breathing techniques or other breathing techniques. ;To learn and understand importance of maintaining oxygen saturations>88%;To learn and understand importance of monitoring SPO2 with pulse oximeter and demonstrate accurate use of the pulse oximeter. To learn and demonstrate proper pursed lip breathing techniques or other breathing techniques. ;To learn and understand importance of maintaining oxygen saturations>88%;To learn and understand importance of monitoring SPO2 with pulse oximeter and demonstrate accurate use of the pulse oximeter. To learn and demonstrate proper pursed lip breathing techniques or other breathing techniques. ;To learn and understand importance of maintaining oxygen saturations>88%;To learn and understand importance of monitoring SPO2 with pulse oximeter and demonstrate accurate use of the pulse oximeter. To learn and demonstrate proper pursed lip breathing techniques or other breathing techniques. ;To learn and understand importance of  maintaining oxygen saturations>88%;To learn and understand importance of monitoring SPO2 with pulse oximeter and demonstrate accurate use of the pulse oximeter. To learn and demonstrate proper pursed lip breathing techniques or other breathing techniques. ;To learn and understand importance of maintaining oxygen saturations>88%;To learn and understand importance of monitoring SPO2 with pulse oximeter and demonstrate accurate use of the pulse oximeter.   Long  Term Goals Exhibits proper breathing techniques, such as pursed lip breathing or other method taught during program session;Compliance with respiratory medication;Maintenance of O2 saturations>88%;Verbalizes importance of monitoring SPO2 with pulse oximeter and return demonstration Exhibits proper breathing techniques, such as pursed lip breathing or other method taught during program session;Compliance with respiratory medication;Maintenance of O2 saturations>88%;Verbalizes importance of monitoring SPO2 with pulse oximeter and return demonstration Exhibits proper breathing techniques, such as pursed lip breathing or other method taught during program session;Compliance with respiratory medication;Maintenance of O2 saturations>88%;Verbalizes importance of monitoring SPO2 with pulse oximeter and return demonstration Exhibits proper breathing techniques, such as pursed lip breathing or other method taught during program session;Compliance with respiratory medication;Maintenance of O2 saturations>88%;Verbalizes importance of monitoring SPO2 with pulse oximeter and return demonstration Exhibits proper breathing techniques, such as pursed lip breathing or other method taught during program session;Compliance with respiratory medication;Maintenance of O2 saturations>88%;Verbalizes importance of monitoring SPO2 with pulse oximeter and return demonstration   Comments Reviewed PLB technique with pt.  Talked about how it works and it's importance in maintaining their  exercise saturations. Asherah is doing well in rehab. She is getting better at PLB and using it more often. She is good about keeping an eye on her breathing at home.  She has ordered a pulse oximeter for home use. Patient reports that she is doing better wither her SOB and using PLB at home to help control this. She stated she does not have pulse ox at home but she plans to order one. Rolena is doing better with her SOB.  She is good about using her PLB to manage her breathing.  She is pleased with how much her breathing has improved.  She had some PVCs over weekend that triggered anixety and SOB and dizziness, but breathing helped.  Today, she was feeling better. Alexyss is doing better with her SOB.  She is good about using her PLB to manage her breathing.  She is pleased with how much her breathing has improved. She states that she is doing well with her respiratory meds as well.   Goals/Expected Outcomes Short: Become more profiecient at using PLB.   Long: Become independent at using PLB. Short; Use pulse oximeter at home Long: Conitnue to improve PLB Short; Use pulse oximeter at home Long: Conitnue to improve PLB Short: Continue to use PLB Long: Conitnue to monitor saturations at home Short: Continue to use PLB Long: Conitnue to monitor saturations at home    [place] Name 06/16/22 0810             Program Oxygen Prescription   Program Oxygen Prescription None         Home Oxygen   Home Oxygen Device None       Sleep Oxygen Prescription None       Home Exercise Oxygen Prescription None       Home Resting Oxygen Prescription None       Compliance with Home Oxygen Use Yes         Goals/Expected Outcomes   Short Term Goals To learn and demonstrate proper pursed lip breathing techniques or other breathing techniques. ;To learn and understand importance of maintaining oxygen saturations>88%;To learn and understand importance of monitoring SPO2 with pulse oximeter and demonstrate accurate use of the  pulse oximeter.       Long  Term Goals Exhibits proper breathing techniques, such as pursed lip breathing or other method taught during program session;Compliance with respiratory medication;Maintenance of O2 saturations>88%;Verbalizes importance of monitoring SPO2 with pulse oximeter and return demonstration       Comments Abreanna continues to check her O2 and HR using her pulse ox. She was encouraged to bring it to rehab to make sure her values are correct. She has no questions using her respiratory meds, she does not always use her Flovent inhaler as it  makes her jittery. She told doctor and they are aware. She continues to use PLB when she needs to.       Goals/Expected Outcomes Short: Check pulse ox at home/rehab Long: Maintain O2 above 88% or whatever value per MD                Oxygen Discharge (Final Oxygen Re-Evaluation):  Oxygen Re-Evaluation - 06/16/22 0810       Program Oxygen Prescription   Program Oxygen Prescription None      Home Oxygen   Home Oxygen Device None    Sleep Oxygen Prescription None    Home Exercise Oxygen Prescription None    Home Resting Oxygen Prescription None    Compliance with Home Oxygen Use Yes      Goals/Expected Outcomes   Short Term Goals To learn and demonstrate proper pursed lip breathing techniques or other breathing techniques. ;To learn and understand importance of maintaining oxygen saturations>88%;To learn and understand importance of monitoring SPO2 with pulse oximeter and demonstrate accurate use of the pulse oximeter.    Long  Term Goals Exhibits proper breathing techniques, such as pursed lip breathing or other method taught during program session;Compliance with respiratory medication;Maintenance of O2 saturations>88%;Verbalizes importance of monitoring SPO2 with pulse oximeter and return demonstration    Comments Tashawna continues to check her O2 and  HR using her pulse ox. She was encouraged to bring it to rehab to make sure her values  are correct. She has no questions using her respiratory meds, she does not always use her Flovent inhaler as it  makes her jittery. She told doctor and they are aware. She continues to use PLB when she needs to.    Goals/Expected Outcomes Short: Check pulse ox at home/rehab Long: Maintain O2 above 88% or whatever value per MD             Initial Exercise Prescription:  Initial Exercise Prescription - 02/17/22 1000       Date of Initial Exercise RX and Referring Provider   Date 02/17/22    Referring Provider Despina Arias MD      Oxygen   Maintain Oxygen Saturation 88% or higher      Treadmill   MPH 2.2    Grade 1    Minutes 15    METs 2.99      Recumbant Bike   Level 3    RPM 50    Watts 25    Minutes 15    METs 3      NuStep   Level 3    SPM 80    Minutes 15    METs 3      Track   Laps 35    Minutes 15    METs 2.9      Prescription Details   Frequency (times per week) 2    Duration Progress to 30 minutes of continuous aerobic without signs/symptoms of physical distress      Intensity   THRR 40-80% of Max Heartrate 103-138    Ratings of Perceived Exertion 11-13    Perceived Dyspnea 0-4      Progression   Progression Continue to progress workloads to maintain intensity without signs/symptoms of physical distress.      Resistance Training   Training Prescription Yes    Weight 3 lb    Reps 10-15             Perform Capillary Blood Glucose checks as needed.  Exercise Prescription Changes:   Exercise Prescription Changes     Row Name 02/17/22 1000 03/01/22 0700 03/15/22 0800 03/28/22 1400 04/11/22 1500     Response to Exercise   Blood Pressure (Admit) 136/64 132/82 112/60 126/64 116/64   Blood Pressure (Exercise) 134/72 142/72 132/70 122/68 --   Blood Pressure (Exit) 122/62 128/72 126/64 104/70 106/68   Heart Rate (Admit) 68 bpm 83 bpm 71 bpm 86 bpm 75 bpm   Heart Rate (Exercise) 103 bpm 94 bpm 87 bpm 88 bpm 97 bpm   Heart Rate (Exit) 75  bpm 79 bpm 85 bpm 77 bpm 82 bpm   Oxygen Saturation (Admit) 96 % 94 % 95 % 92 % 95 %   Oxygen Saturation (Exercise) 85 % 91 % 92 % 91 % 88 %   Oxygen Saturation (Exit) 95 % 93 % 90 % 96 % 92 %   Rating of Perceived Exertion (Exercise) _0 Perceived Dyspnea (Exercise) _1 Symptoms SOB, dizzy on turns SOB SOB SOB SOB   Comments walk test results 2nd full day of exercise -- -- --   Duration -- Progress to 30 minutes of  aerobic without signs/symptoms of physical distress Continue with 30 min of aerobic exercise without signs/symptoms of physical distress. Continue with 30 min of aerobic exercise without  signs/symptoms of physical distress. Continue with 30 min of aerobic exercise without signs/symptoms of physical distress.   Intensity -- THRR unchanged THRR unchanged THRR unchanged THRR unchanged     Progression   Progression -- Continue to progress workloads to maintain intensity without signs/symptoms of physical distress. Continue to progress workloads to maintain intensity without signs/symptoms of physical distress. Continue to progress workloads to maintain intensity without signs/symptoms of physical distress. Continue to progress workloads to maintain intensity without signs/symptoms of physical distress.   Average METs -- 2.95 3.63 3.65 3.83     Resistance Training   Training Prescription -- Yes Yes Yes Yes   Weight -- 3 lb 3 lb 3 lb 4 lb   Reps -- 10-15 10-15 10-15 10-15     Interval Training   Interval Training -- No No No No     Treadmill   MPH -- 2._0 Grade -- 1 1 3.5 3.5   Minutes -- _1 METs -- 2.99 3.71 4.74 4.75     Recumbant Bike   Level -- _2 3.3   Watts -- 25 -- 17 --   Minutes -- _3 METs -- 2.69 3.1 2.41 2.7     NuStep   Level -- _4 Minutes -- _5 METs -- 3.6 4.1 3.8 4.4     Home Exercise Plan   Plans to continue exercise at -- -- Home (comment)  walking, staff videos Home (comment)   walking, staff videos Home (comment)  walking, staff videos   Frequency -- -- Add 3 additional days to program exercise sessions. Add 3 additional days to program exercise sessions. Add 3 additional days to program exercise sessions.   Initial Home Exercises Provided -- -- 03/15/22 03/15/22 03/15/22     Oxygen   Maintain Oxygen Saturation -- 88% or higher 88% or higher 88% or higher 88% or higher    Row Name 04/25/22 1400 05/10/22 0800 05/25/22 1300 06/06/22 1300 06/21/22 1500     Response to Exercise   Blood Pressure (Admit) 106/68 122/70 102/60 124/72 128/76   Blood Pressure (Exit) 118/72 126/70 122/64 122/64 104/60   Heart Rate (Admit) 67 bpm 75 bpm 74 bpm 72 bpm 64 bpm   Heart Rate (Exercise) 101 bpm 113 bpm 90 bpm 91 bpm 79 bpm   Heart Rate (Exit) 69 bpm 79 bpm 71 bpm 66 bpm 70 bpm   Oxygen Saturation (Admit) 95 % 91 % 91 % 95 % 97 %   Oxygen Saturation (Exercise) 89 % 88 % 91 % 93 % 92 %   Oxygen Saturation (Exit) 95 % 93 % 93 % 96 % 96 %   Rating of Perceived Exertion (Exercise) _6 Perceived Dyspnea (Exercise) _7 Symptoms _8    Duration Continue with 30 min of aerobic exercise without signs/symptoms of physical distress. Continue with 30 min of aerobic exercise without signs/symptoms of physical distress. Continue with 30 min of aerobic exercise without signs/symptoms of physical distress. Continue with 30 min of aerobic exercise without signs/symptoms of physical distress. Continue with 30 min of aerobic exercise without signs/symptoms of physical distress.   Intensity _9      Progression   Progression Continue to progress workloads to maintain intensity without  signs/symptoms of physical distress. Continue to progress workloads to maintain intensity without signs/symptoms of physical distress. Continue to progress workloads to maintain intensity without signs/symptoms of  physical distress. Continue to progress workloads to maintain intensity without signs/symptoms of physical distress. Continue to progress workloads to maintain intensity without signs/symptoms of physical distress.   Average METs 4.83 5.13 4.19 4.3 3.2     Resistance Training   Training Prescription _0    Weight 4 lb 4 lb 4 lb 4 lb 3 lb   Reps 10-15 10-15 10-15 10-15 10-15     Interval Training   Interval Training _1      Treadmill   MPH -- 3 2.5 -- --   Grade -- 4 2.5 -- --   Minutes -- 15 15 -- --   METs -- 4.95 3.78 -- --     Recumbant Bike   Level -- -- 4 -- 3   Minutes -- -- 15 -- 15   METs -- -- -- -- 2.71     NuStep   Level _2 Minutes _3 METs 5.4 6.2 4.6 5.4 4.4     REL-XR   Level -- 3 -- -- --   Minutes -- 15 -- -- --   METs -- 3.1 -- -- --     Home Exercise Plan   Plans to continue exercise at Home (comment)  walking, staff videos Home (comment)  walking, staff videos Home (comment)  walking, staff videos Home (comment)  walking, staff videos Home (comment)  walking, staff videos   Frequency Add 3 additional days to program exercise sessions. Add 3 additional days to program exercise sessions. Add 3 additional days to program exercise sessions. Add 3 additional days to program exercise sessions. Add 3 additional days to program exercise sessions.   Initial Home Exercises Provided 03/15/22 03/15/22 03/15/22 03/15/22 03/15/22     Oxygen   Maintain Oxygen Saturation 88% or higher 88% or higher 88% or higher 88% or higher 88% or higher    Row Name 07/04/22 1400             Response to Exercise   Blood Pressure (Admit) 106/64       Blood Pressure (Exit) 122/68       Heart Rate (Admit) 55 bpm       Heart Rate (Exercise) 84 bpm       Heart Rate (Exit) 65 bpm       Oxygen Saturation (Admit) 97 %       Oxygen Saturation (Exercise) 93 %       Oxygen Saturation (Exit) 96 %       Rating of Perceived Exertion  (Exercise) 14       Perceived Dyspnea (Exercise) 0       Symptoms None       Duration Continue with 30 min of aerobic exercise without signs/symptoms of physical distress.       Intensity THRR unchanged         Progression   Progression Continue to progress workloads to maintain intensity without signs/symptoms of physical distress.       Average METs 2.99         Resistance Training   Training Prescription Yes       Weight 3 lb       Reps 10-15         Interval Training  Interval Training No         NuStep   Level 3       Minutes 15       METs 4.1         REL-XR   Level 1       Minutes 15       METs 1.8         Track   Laps 36       Minutes 15       METs 2.96         Home Exercise Plan   Plans to continue exercise at Home (comment)  walking, staff videos       Frequency Add 3 additional days to program exercise sessions.       Initial Home Exercises Provided 03/15/22         Oxygen   Maintain Oxygen Saturation 88% or higher                Exercise Comments:   Exercise Comments     Row Name 02/22/22 0814           Exercise Comments First full day of exercise!  Patient was oriented to gym and equipment including functions, settings, policies, and procedures.  Patient's individual exercise prescription and treatment plan were reviewed.  All starting workloads were established based on the results of the 6 minute walk test done at initial orientation visit.  The plan for exercise progression was also introduced and progression will be customized based on patient's performance and goals.                Exercise Goals and Review:   Exercise Goals     Row Name 02/17/22 1112             Exercise Goals   Increase Physical Activity Yes       Intervention Provide advice, education, support and counseling about physical activity/exercise needs.;Develop an individualized exercise prescription for aerobic and resistive training based on initial  evaluation findings, risk stratification, comorbidities and participant's personal goals.       Expected Outcomes Short Term: Attend rehab on a regular basis to increase amount of physical activity.;Long Term: Add in home exercise to make exercise part of routine and to increase amount of physical activity.;Long Term: Exercising regularly at least 3-5 days a week.       Increase Strength and Stamina Yes       Intervention Provide advice, education, support and counseling about physical activity/exercise needs.;Develop an individualized exercise prescription for aerobic and resistive training based on initial evaluation findings, risk stratification, comorbidities and participant's personal goals.       Expected Outcomes Short Term: Increase workloads from initial exercise prescription for resistance, speed, and METs.;Short Term: Perform resistance training exercises routinely during rehab and add in resistance training at home;Long Term: Improve cardiorespiratory fitness, muscular endurance and strength as measured by increased METs and functional capacity (6MWT)       Able to understand and use rate of perceived exertion (RPE) scale Yes       Intervention Provide education and explanation on how to use RPE scale       Expected Outcomes Short Term: Able to use RPE daily in rehab to express subjective intensity level;Long Term:  Able to use RPE to guide intensity level when exercising independently       Able to understand and use Dyspnea scale Yes       Intervention  Provide education and explanation on how to use Dyspnea scale       Expected Outcomes Short Term: Able to use Dyspnea scale daily in rehab to express subjective sense of shortness of breath during exertion;Long Term: Able to use Dyspnea scale to guide intensity level when exercising independently       Knowledge and understanding of Target Heart Rate Range (THRR) Yes       Intervention Provide education and explanation of THRR including how  the numbers were predicted and where they are located for reference       Expected Outcomes Short Term: Able to state/look up THRR;Short Term: Able to use daily as guideline for intensity in rehab;Long Term: Able to use THRR to govern intensity when exercising independently       Able to check pulse independently Yes       Intervention Provide education and demonstration on how to check pulse in carotid and radial arteries.;Review the importance of being able to check your own pulse for safety during independent exercise       Expected Outcomes Short Term: Able to explain why pulse checking is important during independent exercise;Long Term: Able to check pulse independently and accurately       Understanding of Exercise Prescription Yes       Intervention Provide education, explanation, and written materials on patient's individual exercise prescription       Expected Outcomes Short Term: Able to explain program exercise prescription;Long Term: Able to explain home exercise prescription to exercise independently                Exercise Goals Re-Evaluation :  Exercise Goals Re-Evaluation     Row Name 02/22/22 0752 03/01/22 0759 03/15/22 0805 03/28/22 1426 03/29/22 0802     Exercise Goal Re-Evaluation   Exercise Goals Review Increase Physical Activity;Able to understand and use rate of perceived exertion (RPE) scale;Knowledge and understanding of Target Heart Rate Range (THRR);Understanding of Exercise Prescription;Increase Strength and Stamina;Able to check pulse independently;Able to understand and use Dyspnea scale Increase Physical Activity;Increase Strength and Stamina;Understanding of Exercise Prescription Increase Physical Activity;Increase Strength and Stamina;Understanding of Exercise Prescription;Able to understand and use rate of perceived exertion (RPE) scale;Able to understand and use Dyspnea scale;Knowledge and understanding of Target Heart Rate Range (THRR);Able to check pulse  independently Increase Physical Activity;Increase Strength and Stamina;Understanding of Exercise Prescription Increase Physical Activity;Increase Strength and Stamina;Understanding of Exercise Prescription   Comments Reviewed RPE and dyspnea scales, THR and program prescription with pt today.  Pt voiced understanding and was given a copy of goals to take home. Jecenia is doing well to start rehab. She has tolerated level 3 on the T4. She has also done well on the treadmill at 2.2 mph. We will continue to monitor her progress in the program. Naydelin is doing well in rehab.  She is already walking on the days she is not in class at least three times a week.  Reviewed home exercise with pt today.  Pt plans to walk and use staff videos at home for exercise.  Reviewed THR, pulse, RPE, sign and symptoms, pulse oximetery and when to call 911 or MD.  Also discussed weather considerations and indoor options.  Pt voiced understanding. Mikalia is doing well in rehab.  She is up to level 4 on the NuStep and 17 watts on the bike. She has also moved up her treadmill to 3.0 mph!  We will continue to monitor her progress. Sela reports that she has  made improvements in her strength and stamina. Her SOB has also improved with exercise and she is using PLB to control SOB during exersion. She does not have a pulse ox to monitor SaO2 levels but plans to get one.   Expected Outcomes Short: Use RPE daily to regulate intensity. Long: Follow program prescription in THR. Short: continue to increase treadmill speed. Long: Continue to increase MET levels. Short: Continue to walk and try staff videos Long: Conitnue to exercise independently Short: Try 4 lb weights Long: Continue to improve stamina Short: buy a pulse oximeter. Long: become independent with exercise program.    Row Name 04/11/22 1512 04/19/22 0821 04/25/22 1428 05/10/22 0853 05/24/22 0817     Exercise Goal Re-Evaluation   Exercise Goals Review Increase Physical  Activity;Increase Strength and Stamina;Understanding of Exercise Prescription Increase Physical Activity;Increase Strength and Stamina;Understanding of Exercise Prescription Increase Physical Activity;Increase Strength and Stamina;Understanding of Exercise Prescription Increase Physical Activity;Increase Strength and Stamina;Understanding of Exercise Prescription Increase Physical Activity;Increase Strength and Stamina;Understanding of Exercise Prescription   Comments Maham continues to do well in rehab. She has reached working over 4 METS on the T4 Nustep. She increased to 4 lbs for handweights. She is not qutie hitting her THR. Will continue to monitor. Jaelah is doing well in rehab.  Her breathing is starting to feel better too.  She is starting to feel better overall.  She is walking on her off days for 45-60 min depending on breathing. Angelia is doing well in rehab. She recently improved her overall average MET level to 4.83 METs. She also has tolerated level 4 on the T4 for the full 30 minutes of exercise. She has also done well with 4 lb hand weights for resistance training. We will continue to monitor her progress in the program. Analia continues to do well in rehab. She improved her overall average MET level to 5.13 METs. She also increased her incline on the treadmill to 4% while maintaining a speed of 3 mph. She began using the XR machine as well, and tolerated level 3 well. We will continue to monitor her progress in the program. Eleasha is doing well in the program. She states that she is walking three days a week in addition to her two days of rehab. She is also trying to learn yoga for exercise and relaxation. She also states that her endurance has increased since starting the program as well. She is now able to walk about an hour, and she was only able to walk around 30 to 40 min when she first started the program. We will continue to monitor her progress in the program.   Expected Outcomes Short:  Incread load and watts on RB Long: Continue to increase overall MET level Short: Continue to exercise regularly Long: Continue to improve stamina Short: Continue to increase workload on the treadmill. Long: Continue to increase strength and stamina. Short: Continue to increase workload on the treadmill. Long: Continue to increase strength and stamina. Short: Continue to walk on days away from rehab. Long: Continue to increase strength and stamina.    [place] Name 06/06/22 1357 06/16/22 0806 06/21/22 1530 07/04/22 1407       Exercise Goal Re-Evaluation   Exercise Goals Review Increase Physical Activity;Increase Strength and Stamina;Understanding of Exercise Prescription Increase Physical Activity;Increase Strength and Stamina;Understanding of Exercise Prescription Increase Physical Activity;Increase Strength and Stamina;Understanding of Exercise Prescription Increase Physical Activity;Increase Strength and Stamina;Understanding of Exercise Prescription    Comments Leather continues to do well in  rehab. She has tolerated the T4 at level 4. She has also done well with 4 lb hand weights for resistance training. She had an overall average MET level of 4.3 METs. We will continue to monitor her progress in the program. Quintasia continues to exercise at home. She is walking outside 3 days/week for about an hour each time. She does bring her pulse ox with her during  exercise.  She did state she doesn't think it fully functions 100% and she was encouraged to bring it to class so we can check it. Encouraged to keep up with strength training as well. Rhanda continues to come to rehab. She tried out the arm ergometer due to joint pain on the bike and liked it a little better. We will encourage to keep that up to help incorporate a different type of exercise. She has been ranging between level 3 and 4 on the T4 Nustep, hope to see that increase over time. If she is able to without any MSK limitations, we'd like to see her walk  too. RPE and O2 saturations are great. We will continue to monitor Zanya is doing well in rehab.  She improved her overall average MET level to 2.99 METs.  She also was able to walk up to 36 laps on the track.  We will continue to monitor her progress in the program.    Expected Outcomes Short: Continue to try to walk more. Long: Continue to increase strength and stamina. Short: Incorporate more resistance training into exercise routine Long: Continue to exercise independently Short: Build up workload on arm ergometer Long: Contine to increase overall strength and stamina Short: Continue to increase workloads as tolerated. Long: Continue to increase strength and stamina.             Discharge Exercise Prescription (Final Exercise Prescription Changes):  Exercise Prescription Changes - 07/04/22 1400       Response to Exercise   Blood Pressure (Admit) 106/64    Blood Pressure (Exit) 122/68    Heart Rate (Admit) 55 bpm    Heart Rate (Exercise) 84 bpm    Heart Rate (Exit) 65 bpm    Oxygen Saturation (Admit) 97 %    Oxygen Saturation (Exercise) 93 %    Oxygen Saturation (Exit) 96 %    Rating of Perceived Exertion (Exercise) 14    Perceived Dyspnea (Exercise) 0    Symptoms None    Duration Continue with 30 min of aerobic exercise without signs/symptoms of physical distress.    Intensity THRR unchanged      Progression   Progression Continue to progress workloads to maintain intensity without signs/symptoms of physical distress.    Average METs 2.99      Resistance Training   Training Prescription Yes    Weight 3 lb    Reps 10-15      Interval Training   Interval Training No      NuStep   Level 3    Minutes 15    METs 4.1      REL-XR   Level 1    Minutes 15    METs 1.8      Track   Laps 36    Minutes 15    METs 2.96      Home Exercise Plan   Plans to continue exercise at Home (comment)   walking, staff videos   Frequency Add 3 additional days to program exercise  sessions.    Initial Home Exercises Provided 03/15/22  Oxygen   Maintain Oxygen Saturation 88% or higher             Nutrition:  Target Goals: Understanding of nutrition guidelines, daily intake of sodium <1591m, cholesterol <2073m calories 30% from fat and 7% or less from saturated fats, daily to have 5 or more servings of fruits and vegetables.  Education: All About Nutrition: -Group instruction provided by verbal, written material, interactive activities, discussions, models, and posters to present general guidelines for heart healthy nutrition including fat, fiber, MyPlate, the role of sodium in heart healthy nutrition, utilization of the nutrition label, and utilization of this knowledge for meal planning. Follow up email sent as well. Written material given at graduation.   Biometrics:  Pre Biometrics - 02/17/22 1112       Pre Biometrics   Height _0  (1.727 m)    Weight 247 lb 12.8 oz (112.4 kg)    BMI (Calculated) 37.69    Single Leg Stand 4.4 seconds              Nutrition Therapy Plan and Nutrition Goals:  Nutrition Therapy & Goals - 03/07/22 0816       Nutrition Therapy   Diet Heart healthy, low Na, pulmonary MNT    Drug/Food Interactions Statins/Certain Fruits    Protein (specify units) 100g   x1.2/kg adjusted body weight   Fiber 25 grams    Whole Grain Foods 3 servings    Saturated Fats 16 max. grams    Fruits and Vegetables 8 servings/day    Sodium 2 grams      Personal Nutrition Goals   Nutrition Goal ST: try whole wheat bread, try out low prep meals follow MyPlate LT: Follow MyPlate guidelines, 10956Orotein/day, 25g fiber/day    Comments [age].o. F admitted to pulmonary rehab for ILD. PMHx includes connective tissue disease, HTN, GERD, systemic lupus erythematosus, fibromyalgia, burning mouth syndrome, anxiety, PTSD. Relevant medications includes lipitor, biotin, wellbutrin, lamotrigine, vit D, klonopin, hydroxychloroquine, linzess, melatonin,  metformin, omeprazole, miralax, prednisone, trazadone, venlafaxine. PYP Score: 62. Vegetables & Fruits 6/12. Breads, Grains & Cereals 7/12. Red & Processed Meat 12/12. Poultry 0/2. Fish & Shellfish 0/4. Beans, Nuts & Seeds 1/4. Milk & Dairy Foods 5/6. Toppings, Oils, Seasonings & Salt 17/20. Sweets, Snacks & Restaurant Food 6/14. Beverages 8/10.   She does not cook much. B: strawberries, breakfast bar L: peanut butter and banana sandwich S: fruit (grapes) D: chicken salad sandwich, chili, baked chicken. She uses honey wheat bread, she will sometimes cook food and then freeze them such as chilli and beans, she likes sweet potato fries. Drinks: water. Discussed heart healthy eating and pulmonary MNT.      Intervention Plan   Intervention Prescribe, educate and counsel regarding individualized specific dietary modifications aiming towards targeted core components such as weight, hypertension, lipid management, diabetes, heart failure and other comorbidities.    Expected Outcomes Short Term Goal: Understand basic principles of dietary content, such as calories, fat, sodium, cholesterol and nutrients.;Short Term Goal: A plan has been developed with personal nutrition goals set during dietitian appointment.;Long Term Goal: Adherence to prescribed nutrition plan.             Nutrition Assessments:  MEDIFICTS Score Key: ?70 Need to make dietary changes  40-70 Heart Healthy Diet ? 40 Therapeutic Level Cholesterol Diet  [facility]  Picture Your Plate Total Score on Admission 62      Picture Your Plate  Scores: <40 Unhealthy dietary pattern with much room for improvement. 41-50 Dietary pattern unlikely to meet recommendations for good health and room for improvement. 51-60 More healthful dietary pattern, with some room for improvement.  >60 Healthy dietary pattern, although there may be some specific behaviors that could be  improved.   Nutrition Goals Re-Evaluation:  Nutrition Goals Re-Evaluation     [place] Name 03/29/22 [id number] 04/19/22 0827 04/28/22 0808 05/24/22 0821 06/16/22 0828     Goals   Nutrition Goal -- Short: Continue to read food labels. Long: help control risk factors with nutrition changes. -- -- Short: Continue to read food labels. Long: help control risk factors with nutrition changes.   Comment Patient reports that she has been trying to encorporate more whole grains and fruit into her diet. She is working on reading labels to make sure her foods have good amounts of protein and fiber. Chistina is doing well with her diet.  She ate bad when her anxiety kicked up with the PVCs but now she is getting back to it.  She wants to get back down to 160 lb especially if she can come off the steroids.  She is getting back to the habit of reading labels and being aware of her calories and food intake. Yuri reports needing help with soft foods, provided handouts for this as well as reviewed soft foods that are still heart healthy. Natayla would like to know how to enhance flavor of greek yogurt - discussed being mindful of added sugar, but she could make it sweeter with honey, fruit, or peanut butter. If she would like to have it more savory she could add roasted vegetables, pesto, or make a dip like tzatziki. Reviewed heart healthy foods and balanced meals/snacks; Nadira reports still having general heart healthy eating handouts given at initial consultation which includes foods recommended/not recommended, guidelines for general heart healthy eating such as recommended amount of saturated fat, and MyPlate guidelines. -- Yarima has been making some changes in her diet and is really trying to focus on incorporating fruits/veggies into her foods, creatively. She would really like to starting drinking smoothies and requested  recipes. Will touch base with RD to help assist. She is also interested in eating yogurt but wants to know  which ones are lower sugar. She has been trying to lose weight but the steroids she is on has been preventing her from doing that   Expected Outcome Short: Continue to read food labels. Long: help control risk factors with nutrition changes. Short: Get back to diet again after anxiety up kick Long: Continue to follow food label and MyPlate guidelines -- Short: add more vegetables in diet through smoothies. Long: Continue to follow MyPlate guidelines. Short: Touch base with RD on smoothie recipes and yogurt options Long: Continue to follow RD recommendations            Nutrition Goals Discharge (Final Nutrition Goals Re-Evaluation):  Nutrition Goals Re-Evaluation - 06/16/22 0828       Goals   Nutrition Goal Short: Continue to read food labels. Long: help control risk factors with nutrition changes.    Comment Rashad has been making some changes in her diet and is really trying to focus on incorporating fruits/veggies into her foods, creatively. She would really like to starting drinking smoothies and requested  recipes. Will touch base with RD to help assist. She is also interested in eating yogurt but wants to know which ones are lower sugar. She has been trying to  lose weight but the steroids she is on has been preventing her from doing that    Expected Outcome Short: Touch base with RD on smoothie recipes and yogurt options Long: Continue to follow RD recommendations             Psychosocial: Target Goals: Acknowledge presence or absence of significant depression and/or stress, maximize coping skills, provide positive support system. Participant is able to verbalize types and ability to use techniques and skills needed for reducing stress and depression.   Education: Stress, Anxiety, and Depression - Group verbal and visual presentation to define topics covered.  Reviews how body is impacted by stress, anxiety, and depression.  Also discusses healthy ways to reduce stress and to  treat/manage anxiety and depression.  Written material given at graduation. Flowsheet Row Pulmonary Rehab from [facility]/16/20[facility] in [facility] Pulmonary Rehab  Date 02/24/22  Educator [facility]  Instruction Review Code 1- [employer] Understanding       Education: Sleep Hygiene -Provides group verbal and written instruction about how sleep can affect your health.  Define sleep hygiene, discuss sleep cycles and impact of sleep habits. Review good sleep hygiene tips.    Initial Review & Psychosocial Screening:  Initial Psych Review & Screening - 02/02/22 1421       Initial Review   Current issues with Current Psychotropic Meds;Current Depression;Current Anxiety/Panic;Current Stress Concerns    Source of Stress Concerns Chronic Illness    Comments several health issues can cause her to feel down. Tries to focus on the positive.      Family Dynamics   Good Support System? Yes   son and daughter, friend     Barriers   Psychosocial barriers to participate in program There are no identifiable barriers or psychosocial needs.;The patient should benefit from training in stress management and relaxation.      Screening Interventions   Interventions Encouraged to exercise;To provide support and resources with identified psychosocial needs;Provide feedback about the scores to participant    Expected Outcomes Short Term goal: Utilizing psychosocial counselor, staff and physician to assist with identification of specific Stressors or current issues interfering with healing process. Setting desired goal for each stressor or current issue identified.;Long Term Goal: Stressors or current issues are controlled or eliminated.;Short Term goal: Identification and review with participant of any Quality of Life or Depression concerns found by scoring the questionnaire.;Long Term goal: The participant improves quality of Life and PHQ9 Scores as seen by post scores and/or verbalization of changes              Quality of Life Scores:  Scores of 19 and below usually indicate a poorer quality of life in these areas.  A difference of  2-3 points is a clinically meaningful difference.  A difference of 2-3 points in the total score of the Quality of Life Index has been associated with significant improvement in overall quality of life, self-image, physical symptoms, and general health in studies assessing change in quality of life.  PHQ-9: Review Flowsheet  More data exists      06/21/2022 06/06/2022 04/15/2022 04/12/2022 03/15/2022  Depression screen PHQ 2/9  Decreased Interest _0 Down, Depressed, Hopeless _1 PHQ - 2 Score _2 Altered sleeping _3 0 2  Tired, decreased energy _4 Change in appetite _5 0  Feeling bad or failure about yourself  _0 Trouble concentrating 1 0 1 1 0  Moving slowly or fidgety/restless 0 0 0 1 1  Suicidal thoughts 0 0 0 0 0  PHQ-9 Score _1 Difficult doing work/chores Somewhat difficult - - Very difficult Somewhat difficult   Interpretation of Total Score  Total Score Depression Severity:  1-4 = Minimal depression, 5-9 = Mild depression, 10-14 = Moderate depression, 15-19 = Moderately severe depression, 20-27 = Severe depression   Psychosocial Evaluation and Intervention:  Psychosocial Evaluation - 02/02/22 1435       Psychosocial Evaluation & Interventions   Interventions Encouraged to exercise with the program and follow exercise prescription    Comments Char has no barriers to attending the program. She lives alone. She has a son and a daughter that are her support along with her firends. She is on meds for depression and anxiety. She has multiple illnesses and it can bring her down at times. She tries hard to look at the bright side to prevent being pulled down. She wants to see if she can help her lungs heal as much as possible. She is reday to get started.    Expected Outcomes STG Valla is able to  attend all scheduled sessions. She learns rlaxation and de stress exercises while here. LTG Niajah is able to continue her exercise progression and manage her stress and depression easier.    Continue Psychosocial Services  Follow up required by staff             Psychosocial Re-Evaluation:  Psychosocial Re-Evaluation     [place] Name 03/15/22 0813 03/29/22 4158 04/19/22 0825 05/12/22 0749 06/16/22 0818     Psychosocial Re-Evaluation   Current issues with Current Psychotropic Meds;History of Depression;Current Anxiety/Panic;Current Depression;Current Sleep Concerns Current Psychotropic Meds;History of Depression;Current Anxiety/Panic;Current Depression;Current Sleep Concerns Current Psychotropic Meds;History of Depression;Current Anxiety/Panic;Current Depression;Current Sleep Concerns Current Psychotropic Meds;History of Depression;Current Anxiety/Panic;Current Depression;Current Sleep Concerns Current Psychotropic Meds;History of Depression;Current Anxiety/Panic;Current Depression;Current Sleep Concerns   Comments Jazz is doing well mentally.  She feels that she is doing a little better with her depression currently as she is is just feeling better overall.  Her PHQ has improved by 2 pts over last couple of weeks. She has been struggling with sleep recently for both going to sleep and waking up.  She lays in bed for a while before going to sleep.  She wakes early and unable to go back to sleep.  She takes melatonin each nigh in tablet form.  She is going to try to up her dose some and take 30 min before bed.   She is feeling well managed on her meds.  She does get frustrated with feeling like she still gets SOB. Patient reports that she is going well mentally. She is still on treatment but everything has been stable with no new concerns. She has reported that her SOB is improving since starting the program. Jaleiah had a scare over weekend with PVCs and went to ED.  It triggered her anxiety and still  trying to calm down.  She has a referral to cardiology and an appointment with pulmonary tomorrow.  She is sleeping good. She just wants to find out why they are happening and to make sure she's okay.  Her son has been worried about her and keeps calling to check in on her. Reviewed patient health questionnaire (PHQ-9) with patient for follow up. Previously, patients score indicated signs/symptoms of depression.  Reviewed to see if patient is improving symptom wise while in program.  Score stayed the same and patient states that it is because she has been having little energy. Harley has been having a tough couple past weeks mentally. Last week when coming to rehab, she was very stressed with all of her appointments on top of her rehab and thought about ending the program. After talk to staff, she felt much better and decided to stay in the program as she feels it is truly helping her. As of today, she states her mental health is much better. She talked with her ENT about her lingering cough and they are going to try to proceed with injections to help with her hoarseness. This conversation calmed her down.  When asked about how she copes with her stress, she states she talks to herself, talks with her children who are great support, as well as her doctors. She is taking all of her medications for depression/anxiety and decline any problems with them. She recently  saw her pain management doctor for her fibromyalgia and switched some medications around and hoping her pain will subside a litt bit. She does see a therapist and psyhiatrist virtually once/month and says it is helping her- with no complaints. Told her if she feels she needs extra assistance or help with mental health to reach out to staff, doctors to learn about any other resources, which he also went over as other options as needed. Patient appreciated long conversation. Declined other questions or concerns. We will repeat PHQ.   Expected Outcomes  Short: Try to increase melatonin to help with sleep Long: Continue to focus on positive and exercies for mental boost. Short: continue in current treatment. Long: continue to work on positive mental health  behaviors. Short: Talk to doctor about PVCs  Long: Continue to focus on positive Short: Continue to work toward an improvement in PHQ9 scores by attending [facility] regularly. Long: Continue to improve stress and depression coping skills by talking with staff and attending [facility] regularly and work toward a positive mental state. Short: Repeat PHQ and continue to utilize therapy/doctors/family/staff to help cope with mental health, reach out if need extra resources Long: Continue to maintain positive attitude, practice relaxation   Interventions Encouraged to attend Pulmonary Rehabilitation for the exercise;Stress management education Encouraged to attend Pulmonary Rehabilitation for the exercise;Stress management education Encouraged to attend Pulmonary Rehabilitation for the exercise;Stress management education Encouraged to attend Pulmonary Rehabilitation for the exercise Encouraged to attend Pulmonary Rehabilitation for the exercise   Continue Psychosocial Services  Follow up required by staff Follow up required by staff -- Follow up required by staff Follow up required by staff    [place] Name 06/21/22 0731             Psychosocial Re-Evaluation   Current issues with Current Psychotropic Meds;History of Depression;Current Anxiety/Panic;Current Depression;Current Sleep Concerns       Comments Juanell improved her PHQ from at 14 to an 8. Overall, she feels she is doing better mentally and the PHQ reflects that. Denies needing any other resources at time. Encouraged her to reach out if anything changes.       Expected Outcomes Short: Continue utilizing resources and stay complianet medications Long: Continue to maintain positive attitiude, practice relaxation       Interventions  Encouraged to attend Pulmonary Rehabilitation for the exercise       Continue Psychosocial Services  Follow up required by staff  Psychosocial Discharge (Final Psychosocial Re-Evaluation):  Psychosocial Re-Evaluation - 06/21/22 0731       Psychosocial Re-Evaluation   Current issues with Current Psychotropic Meds;History of Depression;Current Anxiety/Panic;Current Depression;Current Sleep Concerns    Comments Alaa improved her PHQ from at 14 to an 8. Overall, she feels she is doing better mentally and the PHQ reflects that. Denies needing any other resources at time. Encouraged her to reach out if anything changes.    Expected Outcomes Short: Continue utilizing resources and stay complianet medications Long: Continue to maintain positive attitiude, practice relaxation    Interventions Encouraged to attend Pulmonary Rehabilitation for the exercise    Continue Psychosocial Services  Follow up required by staff             Education: Education Goals: Education classes will be provided on a weekly basis, covering required topics. Participant will state understanding/return demonstration of topics presented.  Learning Barriers/Preferences:   General Pulmonary Education Topics:  Infection Prevention: - Provides verbal and written material to individual with discussion of infection control including proper hand washing and proper equipment cleaning during exercise session. Flowsheet Row Pulmonary Rehab from 06/16/2022 in [facility] Cardiac and Pulmonary Rehab  Date 02/17/22  Educator [facility]  Instruction Review Code 1- Verbalizes Understanding       Falls Prevention: - Provides verbal and written material to individual with discussion of falls prevention and safety. Flowsheet Row Pulmonary Rehab from 06/16/2022 in [facility] Cardiac and Pulmonary Rehab  Date 02/02/22  Educator SB  Instruction Review Code 1- Verbalizes Understanding       Chronic Lung Disease Review: -  Group verbal instruction with posters, models, PowerPoint presentations and videos,  to review new updates, new respiratory medications, new advancements in procedures and treatments. Providing information on websites and "800" numbers for continued self-education. Includes information about supplement oxygen, available portable oxygen systems, continuous and intermittent flow rates, oxygen safety, concentrators, and Medicare reimbursement for oxygen. Explanation of Pulmonary Drugs, including class, frequency, complications, importance of spacers, rinsing mouth after steroid MDI's, and proper cleaning methods for nebulizers. Review of basic lung anatomy and physiology related to function, structure, and complications of lung disease. Review of risk factors. Discussion about methods for diagnosing sleep apnea and types of masks and machines for OSA. Includes a review of the use of types of environmental controls: home humidity, furnaces, filters, dust mite/pet prevention, HEPA vacuums. Discussion about weather changes, air quality and the benefits of nasal washing. Instruction on Warning signs, infection symptoms, calling MD promptly, preventive modes, and value of vaccinations. Review of effective airway clearance, coughing and/or vibration techniques. Emphasizing that all should Create an Action Plan. Written material given at graduation. Flowsheet Row Pulmonary Rehab from 06/16/2022 in [facility] Cardiac and Pulmonary Rehab  Education need identified 02/17/22  Date 02/17/22  Educator [facility]  Instruction Review Code 1- Verbalizes Understanding       AED/CPR: - Group verbal and written instruction with the use of models to demonstrate the basic use of the AED with the basic ABC's of resuscitation.    Anatomy and Cardiac Procedures: - Group verbal and visual presentation and models provide information about basic cardiac anatomy and function. Reviews the testing methods done to diagnose heart disease and the  outcomes of the test results. Describes the treatment choices: Medical Management, Angioplasty, or Coronary Bypass Surgery for treating various heart conditions including Myocardial Infarction, Angina, Valve Disease, and Cardiac Arrhythmias.  Written material given at graduation.   Medication Safety: - Group verbal and visual  instruction to review commonly prescribed medications for heart and lung disease. Reviews the medication, class of the drug, and side effects. Includes the steps to properly store meds and maintain the prescription regimen.  Written material given at graduation.   Other: -Provides group and verbal instruction on various topics (see comments)   Knowledge Questionnaire Score:  Knowledge Questionnaire Score - 02/17/22 1114       Knowledge Questionnaire Score   Pre Score 12/18              Core Components/Risk Factors/Patient Goals at Admission:  Personal Goals and Risk Factors at Admission - 02/17/22 1114       Core Components/Risk Factors/Patient Goals on Admission    Weight Management Yes;Weight Loss;Obesity    Intervention Weight Management: Develop a combined nutrition and exercise program designed to reach desired caloric intake, while maintaining appropriate intake of nutrient and fiber, sodium and fats, and appropriate energy expenditure required for the weight goal.;Weight Management/Obesity: Establish reasonable short term and long term weight goals.;Obesity: Provide education and appropriate resources to help participant work on and attain dietary goals.;Weight Management: Provide education and appropriate resources to help participant work on and attain dietary goals.    Admit Weight 247 lb 12.8 oz (112.4 kg)    Goal Weight: Short Term 243 lb (110.2 kg)    Goal Weight: Long Term 200 lb (90.7 kg)    Expected Outcomes Short Term: Continue to assess and modify interventions until short term weight is achieved;Weight Loss: Understanding of general  recommendations for a balanced deficit meal plan, which promotes 1-2 lb weight loss per week and includes a negative energy balance of [phone] kcal/d;Understanding recommendations for meals to include 15-35% energy as protein, 25-35% energy from fat, 35-60% energy from carbohydrates, less than 222m of dietary cholesterol, 20-35 gm of total fiber daily;Understanding of distribution of calorie intake throughout the day with the consumption of 4-5 meals/snacks    Improve shortness of breath with ADL's Yes    Intervention Provide education, individualized exercise plan and daily activity instruction to help decrease symptoms of SOB with activities of daily living.    Expected Outcomes Short Term: Improve cardiorespiratory fitness to achieve a reduction of symptoms when performing ADLs;Long Term: Be able to perform more ADLs without symptoms or delay the onset of symptoms    Increase knowledge of respiratory medications and ability to use respiratory devices properly  Yes    Intervention Provide education and demonstration as needed of appropriate use of medications, inhalers, and oxygen therapy.    Expected Outcomes Short Term: Achieves understanding of medications use. Understands that oxygen is a medication prescribed by physician. Demonstrates appropriate use of inhaler and oxygen therapy.;Long Term: Maintain appropriate use of medications, inhalers, and oxygen therapy.    Hypertension Yes    Intervention Provide education on lifestyle modifcations including regular physical activity/exercise, weight management, moderate sodium restriction and increased consumption of fresh fruit, vegetables, and low fat dairy, alcohol moderation, and smoking cessation.;Monitor prescription use compliance.    Expected Outcomes Short Term: Continued assessment and intervention until BP is < 140/966mHG in hypertensive participants. < 130/8046mG in hypertensive participants with diabetes, heart failure or chronic kidney  disease.;Long Term: Maintenance of blood pressure at goal levels.    Lipids Yes    Intervention Provide education and support for participant on nutrition & aerobic/resistive exercise along with prescribed medications to achieve LDL <25m72mDL >40mg67m Expected Outcomes Short Term: Participant states understanding of desired cholesterol  values and is compliant with medications prescribed. Participant is following exercise prescription and nutrition guidelines.;Long Term: Cholesterol controlled with medications as prescribed, with individualized exercise RX and with personalized nutrition plan. Value goals: LDL < 21m, HDL > 40 mg.             Education:Diabetes - Individual verbal and written instruction to review signs/symptoms of diabetes, desired ranges of glucose level fasting, after meals and with exercise. Acknowledge that pre and post exercise glucose checks will be done for 3 sessions at entry of program.   Know Your Numbers and Heart Failure: - Group verbal and visual instruction to discuss disease risk factors for cardiac and pulmonary disease and treatment options.  Reviews associated critical values for Overweight/Obesity, Hypertension, Cholesterol, and Diabetes.  Discusses basics of heart failure: signs/symptoms and treatments.  Introduces Heart Failure Zone chart for action plan for heart failure.  Written material given at graduation.   Core Components/Risk Factors/Patient Goals Review:   Goals and Risk Factor Review     Row Name 03/15/22 0823 03/29/22 0809 04/19/22 0822 05/24/22 0823 06/16/22 0805     Core Components/Risk Factors/Patient Goals Review   Personal Goals Review Weight Management/Obesity;Increase knowledge of respiratory medications and ability to use respiratory devices properly.;Improve shortness of breath with ADL's;Hypertension;Lipids Weight Management/Obesity;Increase knowledge of respiratory medications and ability to use respiratory devices  properly.;Improve shortness of breath with ADL's;Hypertension;Lipids Weight Management/Obesity;Increase knowledge of respiratory medications and ability to use respiratory devices properly.;Improve shortness of breath with ADL's;Hypertension;Lipids Weight Management/Obesity;Increase knowledge of respiratory medications and ability to use respiratory devices properly.;Improve shortness of breath with ADL's;Hypertension;Lipids Weight Management/Obesity;Increase knowledge of respiratory medications and ability to use respiratory devices properly.;Improve shortness of breath with ADL's;Hypertension   Review SAnoushkais doing well in rehab.  Her weight is trending down.  She is down to 246 lb today.  She is still getting SOB some days.  Today, was the first day that she felt winded before class and during class, so she knows that it is getting better overall.  She is on prednisone and doing well with her inhalers to help her breathing.  Her pressures continue to do well and she is checking them three times a week at home. SSiobahnreports that she has had steady weight loss of about 1 lb a week. She montiors her BP at home and reports that they are in acceptable ranges. She also reports improvements in SOB and reports using PLB to help control this. SAshelynwas in ED over weekend for PVCs and now has a referral for cardiology to look at them.  She was SOB and dizziheaded.  They talked about her doing a Zio patch to review.  Her breathing is feeling better since when she started.  She is now able to do more before getting SOB.  She is doing well on her respiratory meds.  She is doing well with her pressures.  Her weight continues to trend down.  They talked about changing some meds to include a beta blocker to help with heart. SKimethais still looing to lose some weight. She stated that she would like to lose around 50 lbs as a long term goal. SMarkettastates that she has not been checking her BP at home, but has the resources  to do so. She is doing well with her respiratory meds as well. Her SOB with exercise has improved compared to when she started the program also. STaianahas been frustrated with an on-going cough. She saw ENT  and is supposed to be getting injections and meds to help with hoarsness. They are not sure if it will help but wants to try. She was advised to ask her doctor if she is OK to exercise directly after. She has been checking her weight and has lost a couple lbs, but has had some low appetite. She has been feeling better and thinks it has improved. She has been on steroids for months, prednisone which prevents her from losing weight. She wants to talk with with her doctor about how much longer Overall, she feels her SOB has improved since starting the program. She does not check her BP at home but has stable BPs at rehab. Talked about the importance. She continues to take all her medications as prescribed, including her myfortic which she feels helps her.   Expected Outcomes Short: Continue to work on breathing Long: Conitnue to work on [employer] loss Short: continue to use PLB to help control SOB. Long: continue to work on weight loss goal. Short: Get cardiology appt to see about PVCs  Long; Continue to monitor risk factors. Short: Check BP at home more regularly. Long: Continue to monitor lifestyle risk factors. Short: Talk to doctor about medications Long: Contonue to manage risk factors            Core Components/Risk Factors/Patient Goals at Discharge (Final Review):   Goals and Risk Factor Review - 06/16/22 0805       Core Components/Risk Factors/Patient Goals Review   Personal Goals Review Weight Management/Obesity;Increase knowledge of respiratory medications and ability to use respiratory devices properly.;Improve shortness of breath with ADL's;Hypertension    Review Lawrencia has been frustrated with an on-going cough. She saw ENT and is supposed to be getting injections and meds to help with  hoarsness. They are not sure if it will help but wants to try. She was advised to ask her doctor if she is OK to exercise directly after. She has been checking her weight and has lost a couple lbs, but has had some low appetite. She has been feeling better and thinks it has improved. She has been on steroids for months, prednisone which prevents her from losing weight. She wants to talk with with her doctor about how much longer Overall, she feels her SOB has improved since starting the program. She does not check her BP at home but has stable BPs at rehab. Talked about the importance. She continues to take all her medications as prescribed, including her myfortic which she feels helps her.    Expected Outcomes Short: Talk to doctor about medications Long: Contonue to manage risk factors             ITP Comments:  ITP Comments     Row Name 02/02/22 1433 02/17/22 1044 02/22/22 0752 03/07/22 0816 03/16/22 0953   ITP Comments Virtual orientation call completed today. shehas an appointment on Date: 02/09/2022  for EP eval and gym Orientation.  Documentation of diagnosis can be found in [facility] Date: 12/29/2021 . Completed 6MWT and gym orientation. Initial ITP created and sent for review to Dr. Zetta Bills, Medical Director. First full day of exercise!  Patient was oriented to gym and equipment including functions, settings, policies, and procedures.  Patient's individual exercise prescription and treatment plan were reviewed.  All starting workloads were established based on the results of the 6 minute walk test done at initial orientation visit.  The plan for exercise progression was also introduced and progression will be customized  based on patient's performance and goals. Completed initial RD consultation 30 Day review completed. Medical Director ITP review done, changes made as directed, and signed approval by Medical Director.    [place] Name 04/13/22 0754 05/11/22 0821 06/08/22 0739 07/06/22 0952      ITP Comments 30 Day review completed. Medical Director ITP review done, changes made as directed, and signed approval by Medical Director. 30 Day review completed. Medical Director ITP review done, changes made as directed, and signed approval by Medical Director. 30 Day review completed. Medical Director ITP review done, changes made as directed, and signed approval by Medical Director. 30 Day review completed. Medical Director ITP review done, changes made as directed, and signed approval by Medical Director.             Comments:

## 2022-07-07 DIAGNOSIS — J849 Interstitial pulmonary disease, unspecified: Secondary | ICD-10-CM

## 2022-07-07 NOTE — Progress Notes (Signed)
Discharge Progress Report  Patient Details  Name: Nicole Ballard MRN: [id number] Date of Birth: 1956-01-22 Referring Provider:   Flowsheet Row Pulmonary Rehab from 02/17/2022 in [facility]  Referring Provider Despina Arias MD        Number of Visits: 27  Reason for Discharge:  Early Exit:  Insurance  Smoking History:  Social History   Tobacco Use  Smoking Status Never  Smokeless Tobacco Never    Diagnosis:  ILD (interstitial lung disease) ([place])  ADL UCSD:  Pulmonary Assessment Scores     Row Name 02/17/22 1115         ADL UCSD   ADL Phase Entry     SOB Score total 43     Rest 1     Walk 2     Stairs 3     Bath 1     Dress 3     Shop 2       CAT Score   CAT Score 26       mMRC Score   mMRC Score 2              Initial Exercise Prescription:  Initial Exercise Prescription - 02/17/22 1000       Date of Initial Exercise RX and Referring Provider   Date 02/17/22    Referring Provider Despina Arias MD      Oxygen   Maintain Oxygen Saturation 88% or higher      Treadmill   MPH 2.2    Grade 1    Minutes 15    METs 2.99      Recumbant Bike   Level 3    RPM 50    Watts 25    Minutes 15    METs 3      NuStep   Level 3    SPM 80    Minutes 15    METs 3      Track   Laps 35    Minutes 15    METs 2.9      Prescription Details   Frequency (times per week) 2    Duration Progress to 30 minutes of continuous aerobic without signs/symptoms of physical distress      Intensity   THRR 40-80% of Max Heartrate 103-138    Ratings of Perceived Exertion 11-13    Perceived Dyspnea 0-4      Progression   Progression Continue to progress workloads to maintain intensity without signs/symptoms of physical distress.      Resistance Training   Training Prescription Yes    Weight 3 lb    Reps 10-15             Discharge Exercise Prescription (Final Exercise Prescription Changes):  Exercise Prescription  Changes - 07/04/22 1400       Response to Exercise   Blood Pressure (Admit) 106/64    Blood Pressure (Exit) 122/68    Heart Rate (Admit) 55 bpm    Heart Rate (Exercise) 84 bpm    Heart Rate (Exit) 65 bpm    Oxygen Saturation (Admit) 97 %    Oxygen Saturation (Exercise) 93 %    Oxygen Saturation (Exit) 96 %    Rating of Perceived Exertion (Exercise) 14    Perceived Dyspnea (Exercise) 0    Symptoms None    Duration Continue with 30 min of aerobic exercise without signs/symptoms of physical distress.    Intensity THRR unchanged      Progression  Progression Continue to progress workloads to maintain intensity without signs/symptoms of physical distress.    Average METs 2.99      Resistance Training   Training Prescription Yes    Weight 3 lb    Reps 10-15      Interval Training   Interval Training No      NuStep   Level 3    Minutes 15    METs 4.1      REL-XR   Level 1    Minutes 15    METs 1.8      Track   Laps 36    Minutes 15    METs 2.96      Home Exercise Plan   Plans to continue exercise at Home (comment)   walking, staff videos   Frequency Add 3 additional days to program exercise sessions.    Initial Home Exercises Provided 03/15/22      Oxygen   Maintain Oxygen Saturation 88% or higher             Functional Capacity:  6 Minute Walk     Row Name 02/17/22 1045         6 Minute Walk   Phase Initial     Distance 1320 feet     Walk Time 6 minutes     # of Rest Breaks 0     MPH 2.5     METS 2.72     RPE 11     Perceived Dyspnea  1     VO2 Peak 9.52     Symptoms Yes (comment)     Comments SOB, dizzy on turns     Resting HR 68 bpm     Resting BP 136/64     Resting Oxygen Saturation  96 %     Exercise Oxygen Saturation  during 6 min walk 85 %     Max Ex. HR 103 bpm     Max Ex. BP 134/72     2 Minute Post BP 132/62       Interval HR   1 Minute HR 86     2 Minute HR 95     3 Minute HR 103     4 Minute HR 102     5 Minute HR 102      6 Minute HR 103     2 Minute Post HR 73     Interval Heart Rate? Yes       Interval Oxygen   Interval Oxygen? Yes     Baseline Oxygen Saturation % 96 %     1 Minute Oxygen Saturation % 89 %     1 Minute Liters of Oxygen 0 L  Room Air     2 Minute Oxygen Saturation % 87 %     2 Minute Liters of Oxygen 0 L     3 Minute Oxygen Saturation % 86 %     3 Minute Liters of Oxygen 0 L     4 Minute Oxygen Saturation % 85 %     4 Minute Liters of Oxygen 0 L     5 Minute Oxygen Saturation % 87 %     5 Minute Liters of Oxygen 0 L     6 Minute Oxygen Saturation % 86 %     6 Minute Liters of Oxygen 0 L     2 Minute Post Oxygen Saturation % 95 %     2 Minute Post Liters of Oxygen  0 L              Psychological, QOL, Others - Outcomes: PHQ 2/9:    06/21/2022    7:31 AM 06/06/2022   10:24 AM 04/15/2022    9:23 AM 04/12/2022    7:37 AM 03/15/2022    8:14 AM  Depression screen PHQ 2/9  Decreased Interest '1 1 3 1 1  '$ Down, Depressed, Hopeless '1 3 3 1 1  '$ PHQ - 2 Score '2 4 6 2 2  '$ Altered sleeping '1 1 1 '$ 0 2  Tired, decreased energy '1 3 1 2 1  '$ Change in appetite '2 3 1 3 '$ 0  Feeling bad or failure about yourself  '1 3 1 1 1  '$ Trouble concentrating 1 0 1 1 0  Moving slowly or fidgety/restless 0 0 0 1 1  Suicidal thoughts 0 0 0 0 0  PHQ-9 Score '8 14 11 10 7  '$ Difficult doing work/chores Somewhat difficult   Very difficult Somewhat difficult     Nutrition & Weight - Outcomes:  Pre Biometrics - 02/17/22 1112       Pre Biometrics   Height '5\' 8"'$  (1.727 m)    Weight 247 lb 12.8 oz (112.4 kg)    BMI (Calculated) 37.69    Single Leg Stand 4.4 seconds              Nutrition:  Nutrition Therapy & Goals - 03/07/22 0816       Nutrition Therapy   Diet Heart healthy, low Na, pulmonary MNT    Drug/Food Interactions Statins/Certain Fruits    Protein (specify units) 100g   x1.2/kg adjusted body weight   Fiber 25 grams    Whole Grain Foods 3 servings    Saturated Fats 16 max. grams     Fruits and Vegetables 8 servings/day    Sodium 2 grams      Personal Nutrition Goals   Nutrition Goal ST: try whole wheat bread, try out low prep meals follow MyPlate LT: Follow MyPlate guidelines, 378H protein/day, 25g fiber/day    Comments 66 y.o. F admitted to pulmonary rehab for ILD. PMHx includes connective tissue disease, HTN, GERD, systemic lupus erythematosus, fibromyalgia, burning mouth syndrome, anxiety, PTSD. Relevant medications includes lipitor, biotin, wellbutrin, lamotrigine, vit D, klonopin, hydroxychloroquine, linzess, melatonin, metformin, omeprazole, miralax, prednisone, trazadone, venlafaxine. PYP Score: 62. Vegetables & Fruits 6/12. Breads, Grains & Cereals 7/12. Red & Processed Meat 12/12. Poultry 0/2. Fish & Shellfish 0/4. Beans, Nuts & Seeds 1/4. Milk & Dairy Foods 5/6. Toppings, Oils, Seasonings & Salt 17/20. Sweets, Snacks & Restaurant Food 6/14. Beverages 8/10.   She does not cook much. B: strawberries, breakfast bar L: peanut butter and banana sandwich S: fruit (grapes) D: chicken salad sandwich, chili, baked chicken. She uses honey wheat bread, she will sometimes cook food and then freeze them such as chilli and beans, she likes sweet potato fries. Drinks: water. Discussed heart healthy eating and pulmonary MNT.      Intervention Plan   Intervention Prescribe, educate and counsel regarding individualized specific dietary modifications aiming towards targeted core components such as weight, hypertension, lipid management, diabetes, heart failure and other comorbidities.    Expected Outcomes Short Term Goal: Understand basic principles of dietary content, such as calories, fat, sodium, cholesterol and nutrients.;Short Term Goal: A plan has been developed with personal nutrition goals set during dietitian appointment.;Long Term Goal: Adherence to prescribed nutrition plan.             Nutrition  Discharge:   Education Questionnaire Score:  Knowledge Questionnaire Score -  02/17/22 1114       Knowledge Questionnaire Score   Pre Score 12/18             Goals reviewed with patient; copy given to patient.

## 2022-07-07 NOTE — Progress Notes (Signed)
Pulmonary Individual Treatment Plan  Patient Details  Name: Nicole Ballard MRN: [id number] Date of Birth: 05-30-1956 Referring Provider:   Flowsheet Row Pulmonary Rehab from 02/17/2022 in [facility] Cardiac and Pulmonary Rehab  Referring Provider Despina Arias MD       Initial Encounter Date:  Flowsheet Row Pulmonary Rehab from 02/17/2022 in [facility] Cardiac and Pulmonary Rehab  Date 02/17/22       Visit Diagnosis: ILD (interstitial lung disease) ([place])  Patient's Home Medications on Admission:  Current Outpatient Medications:    albuterol (VENTOLIN HFA) 108 (90 Base) MCG/ACT inhaler, SMARTSIG:2 Puff(s) By Mouth Every 6 Hours PRN, Disp: , Rfl:    amLODipine (NORVASC) 2.5 MG tablet, TAKE 3 TABLETS BY MOUTH DAILY, Disp: 270 tablet, Rfl: 3   aspirin EC 81 MG tablet, Take 1 tablet (81 mg total) by mouth daily., Disp: 30 tablet, Rfl: 0   atenolol (TENORMIN) 25 MG tablet, Take 1 tablet by mouth in the morning and at bedtime., Disp: , Rfl:    atorvastatin (LIPITOR) 40 MG tablet, TAKE 1 TABLET BY MOUTH  DAILY, Disp: 90 tablet, Rfl: 3   Biotin 1 MG CAPS, Take 1 capsule by mouth daily., Disp: 30 capsule, Rfl: 0   buPROPion (WELLBUTRIN XL) 300 MG 24 hr tablet, Take 300 mg by mouth in the morning., Disp: , Rfl:    Cholecalciferol (VITAMIN D) 2000 UNITS CAPS, Take 1 capsule by mouth daily., Disp: , Rfl:    clobetasol ointment (TEMOVATE) 4.09 %, Apply 1 application topically 2 (two) times daily., Disp: , Rfl:    clonazePAM (KLONOPIN) 0.5 MG tablet, Take 0.25 mg by mouth at bedtime., Disp: , Rfl:    clotrimazole-betamethasone (LOTRISONE) cream, Apply 1 application topically 2 (two) times daily., Disp: 45 g, Rfl: 0   diclofenac Sodium (VOLTAREN) 1 % GEL, Apply topically daily as needed., Disp: , Rfl:    famotidine (PEPCID) 20 MG tablet, Take by mouth., Disp: , Rfl:    fluticasone (FLONASE) 50 MCG/ACT nasal spray, Place 2 sprays into both nostrils daily., Disp: 48 g, Rfl: 3   gabapentin (NEURONTIN) 300  MG capsule, Take 300 mg (1 capsule) in the morning and at 300 mg (1 capsule) at noon and 600 mg (2 capsules) at night., Disp: , Rfl:    hydroxychloroquine (PLAQUENIL) 200 MG tablet, Take by mouth 2 (two) times daily., Disp: , Rfl:    hydrOXYzine (ATARAX) 10 MG tablet, Take 1 tablet (10 mg total) by mouth 3 (three) times daily as needed., Disp: 30 tablet, Rfl: 0   lamoTRIgine (LAMICTAL) 200 MG tablet, Take 400 mg by mouth at bedtime., Disp: , Rfl:    LINZESS 145 MCG CAPS capsule, Take 145 mcg by mouth daily., Disp: , Rfl:    loratadine (CLARITIN) 10 MG tablet, Take 1 tablet (10 mg total) by mouth daily., Disp: 90 tablet, Rfl: 1   Melatonin 10 MG TABS, Take 1 tablet by mouth at bedtime., Disp: , Rfl:    metFORMIN (GLUCOPHAGE-XR) 500 MG 24 hr tablet, TAKE 1 TABLET(500 MG) BY MOUTH DAILY WITH BREAKFAST, Disp: 90 tablet, Rfl: 1   metoprolol succinate (TOPROL-XL) 25 MG 24 hr tablet, Take 1 tablet (25 mg total) by mouth daily. In place of Atenolol, Disp: 30 tablet, Rfl: 0   mycophenolate (MYFORTIC) 360 MG TBEC EC tablet, Take 720 mg by mouth 2 (two) times daily., Disp: , Rfl:    olopatadine (PATANOL) 0.1 % ophthalmic solution, Place 1 drop into both eyes 2 (two) times daily., Disp: 5 mL,  Rfl: 1   omeprazole (PRILOSEC) 40 MG capsule, Take 1 capsule by mouth daily at 12 noon., Disp: , Rfl:    ondansetron (ZOFRAN) 4 MG tablet, Take 1 tablet (4 mg total) by mouth every 8 (eight) hours as needed for nausea or vomiting., Disp: 20 tablet, Rfl: 0   Polyethylene Glycol 3350 (MIRALAX PO), Take by mouth., Disp: , Rfl:    predniSONE (DELTASONE) 2.5 MG tablet, Take 1 tablet by mouth daily at 12 noon., Disp: , Rfl:    telmisartan (MICARDIS) 80 MG tablet, TAKE 1 TABLET BY MOUTH DAILY, Disp: 90 tablet, Rfl: 3   traZODone (DESYREL) 100 MG tablet, Take 150 mg by mouth at bedtime., Disp: , Rfl:    triamcinolone ointment (KENALOG) 0.1 %, , Disp: , Rfl:    venlafaxine (EFFEXOR) 100 MG tablet, Take 100 mg by mouth every  evening., Disp: , Rfl:   Past Medical History: Past Medical History:  Diagnosis Date   Anxiety    Bipolar 1 disorder ([place])    Chronic kidney disease    Chronic pain    Diabetes mellitus without complication (HCC)    Fibromyalgia    GERD (gastroesophageal reflux disease)    Hearing loss of right ear    Hemifacial spasm    Hyperlipidemia    Hypertension    Insomnia    Lumbago    Osteoarthritis    Paresthesia    Rhinitis    Systemic lupus erythematosus ([place])    Vitamin D deficiency     Tobacco Use: Social History   Tobacco Use  Smoking Status Never  Smokeless Tobacco Never    Labs: Review Flowsheet  More data exists      Latest Ref Rng & Units 10/09/2019 02/14/2020 06/15/2020 12/14/2020 06/15/2021  Labs for ITP Cardiac and Pulmonary Rehab  Cholestrol <200 mg/dL - - 151  - 154   LDL (calc) mg/dL (calc) - - 74  - 75   HDL-C > OR = 50 mg/dL - - 61  - 62   Trlycerides <150 mg/dL - - 78  - 89   Hemoglobin A1c <5.7 % of total Hgb 5.5  5.6  5.5  5.7  5.5      Pulmonary Assessment Scores:  Pulmonary Assessment Scores     Row Name 02/17/22 1115         ADL UCSD   ADL Phase Entry     SOB Score total 43     Rest 1     Walk 2     Stairs 3     Bath 1     Dress 3     Shop 2       CAT Score   CAT Score 26       mMRC Score   mMRC Score 2              UCSD: Self-administered rating of dyspnea associated with activities of daily living (ADLs) 6-point scale (0 = "not at all" to 5 = "maximal or unable to do because of breathlessness")  Scoring Scores range from 0 to 120.  Minimally important difference is 5 units  CAT: CAT can identify the health impairment of COPD patients and is better correlated with disease progression.  CAT has a scoring range of zero to 40. The CAT score is classified into four groups of low (less than 10), medium (10 - 20), high (21-30) and very high (31-40) based on the impact level of disease on health status. A  CAT score over 10  suggests significant symptoms.  A worsening CAT score could be explained by an exacerbation, poor medication adherence, poor inhaler technique, or progression of COPD or comorbid conditions.  CAT MCID is 2 points  mMRC: mMRC (Modified Medical Research Council) Dyspnea Scale is used to assess the degree of baseline functional disability in patients of respiratory disease due to dyspnea. No minimal important difference is established. A decrease in score of 1 point or greater is considered a positive change.   Pulmonary Function Assessment:  Pulmonary Function Assessment - 02/17/22 1115       Breath   Shortness of Breath Yes;Panic with Shortness of Breath;Fear of Shortness of Breath;Limiting activity             Exercise Target Goals: Exercise Program Goal: Individual exercise prescription set using results from initial 6 min walk test and THRR while considering  patient's activity barriers and safety.   Exercise Prescription Goal: Initial exercise prescription builds to 30-45 minutes a day of aerobic activity, 2-3 days per week.  Home exercise guidelines will be given to patient during program as part of exercise prescription that the participant will acknowledge.  Education: Aerobic Exercise: - Group verbal and visual presentation on the components of exercise prescription. Introduces F.I.T.T principle from ACSM for exercise prescriptions.  Reviews F.I.T.T. principles of aerobic exercise including progression. Written material given at graduation. Flowsheet Row Pulmonary Rehab from 06/16/2022 in [facility] Cardiac and Pulmonary Rehab  Date 03/10/22  Educator [facility]  Instruction Review Code 1- Verbalizes Understanding       Education: Resistance Exercise: - Group verbal and visual presentation on the components of exercise prescription. Introduces F.I.T.T principle from ACSM for exercise prescriptions  Reviews F.I.T.T. principles of resistance exercise including progression. Written  material given at graduation.    Education: Exercise & Equipment Safety: - Individual verbal instruction and demonstration of equipment use and safety with use of the equipment. Flowsheet Row Pulmonary Rehab from 06/16/2022 in [facility] Cardiac and Pulmonary Rehab  Date 02/17/22  Educator [facility]  Instruction Review Code 1- Verbalizes Understanding       Education: Exercise Physiology & General Exercise Guidelines: - Group verbal and written instruction with models to review the exercise physiology of the cardiovascular system and associated critical values. Provides general exercise guidelines with specific guidelines to those with heart or lung disease.  Flowsheet Row Pulmonary Rehab from 06/16/2022 in [facility] Cardiac and Pulmonary Rehab  Date 03/03/22  Educator NT  Instruction Review Code 1- [employer] Understanding       Education: Flexibility, Balance, Mind/Body Relaxation: - Group verbal and visual presentation with interactive activity on the components of exercise prescription. Introduces F.I.T.T principle from ACSM for exercise prescriptions. Reviews F.I.T.T. principles of flexibility and balance exercise training including progression. Also discusses the mind body connection.  Reviews various relaxation techniques to help reduce and manage stress (i.e. Deep breathing, progressive muscle relaxation, and visualization). Balance handout provided to take home. Written material given at graduation.   Activity Barriers & Risk Stratification:  Activity Barriers & Cardiac Risk Stratification - 02/17/22 1048       Activity Barriers & Cardiac Risk Stratification   Activity Barriers Right Knee Replacement;Muscular Weakness;Deconditioning;Balance Concerns;Shortness of Breath             6 Minute Walk:  6 Minute Walk     Row Name 02/17/22 1045         6 Minute Walk   Phase Initial     Distance 1320  feet     Walk Time 6 minutes     # of Rest Breaks 0     MPH 2.5     METS 2.72      RPE 11     Perceived Dyspnea  1     VO2 Peak 9.52     Symptoms Yes (comment)     Comments SOB, dizzy on turns     Resting HR 68 bpm     Resting BP 136/64     Resting Oxygen Saturation  96 %     Exercise Oxygen Saturation  during 6 min walk 85 %     Max Ex. HR 103 bpm     Max Ex. BP 134/72     2 Minute Post BP 132/62       Interval HR   1 Minute HR 86     2 Minute HR 95     3 Minute HR 103     4 Minute HR 102     5 Minute HR 102     6 Minute HR 103     2 Minute Post HR 73     Interval Heart Rate? Yes       Interval Oxygen   Interval Oxygen? Yes     Baseline Oxygen Saturation % 96 %     1 Minute Oxygen Saturation % 89 %     1 Minute Liters of Oxygen 0 L  Room Air     2 Minute Oxygen Saturation % 87 %     2 Minute Liters of Oxygen 0 L     3 Minute Oxygen Saturation % 86 %     3 Minute Liters of Oxygen 0 L     4 Minute Oxygen Saturation % 85 %     4 Minute Liters of Oxygen 0 L     5 Minute Oxygen Saturation % 87 %     5 Minute Liters of Oxygen 0 L     6 Minute Oxygen Saturation % 86 %     6 Minute Liters of Oxygen 0 L     2 Minute Post Oxygen Saturation % 95 %     2 Minute Post Liters of Oxygen 0 L             Oxygen Initial Assessment:  Oxygen Initial Assessment - 02/17/22 1115       Home Oxygen   Home Oxygen Device None    Sleep Oxygen Prescription None    Home Exercise Oxygen Prescription None    Home Resting Oxygen Prescription None    Compliance with Home Oxygen Use Yes      Initial 6 min Walk   Oxygen Used None      Program Oxygen Prescription   Program Oxygen Prescription None      Intervention   Short Term Goals To learn and demonstrate proper pursed lip breathing techniques or other breathing techniques. ;To learn and demonstrate proper use of respiratory medications;To learn and understand importance of maintaining oxygen saturations>88%;To learn and understand importance of monitoring SPO2 with pulse oximeter and demonstrate accurate use of the  pulse oximeter.    Long  Term Goals Exhibits proper breathing techniques, such as pursed lip breathing or other method taught during program session;Demonstrates proper use of MDI's;Compliance with respiratory medication;Maintenance of O2 saturations>88%;Verbalizes importance of monitoring SPO2 with pulse oximeter and return demonstration             Oxygen Re-Evaluation:  Oxygen Re-Evaluation     Row Name 02/22/22 0753 03/15/22 0826 03/29/22 0818 04/19/22 0830 05/24/22 0827     Program Oxygen Prescription   Program Oxygen Prescription _0      Home Oxygen   Home Oxygen Device _1    Sleep Oxygen Prescription _2    Home Exercise Oxygen Prescription _3    Home Resting Oxygen Prescription _4    Compliance with Home Oxygen Use _5      Goals/Expected Outcomes   Short Term Goals To learn and demonstrate proper pursed lip breathing techniques or other breathing techniques. ;To learn and understand importance of maintaining oxygen saturations>88%;To learn and understand importance of monitoring SPO2 with pulse oximeter and demonstrate accurate use of the pulse oximeter. To learn and demonstrate proper pursed lip breathing techniques or other breathing techniques. ;To learn and understand importance of maintaining oxygen saturations>88%;To learn and understand importance of monitoring SPO2 with pulse oximeter and demonstrate accurate use of the pulse oximeter. To learn and demonstrate proper pursed lip breathing techniques or other breathing techniques. ;To learn and understand importance of maintaining oxygen saturations>88%;To learn and understand importance of monitoring SPO2 with pulse oximeter and demonstrate accurate use of the pulse oximeter. To learn and demonstrate proper pursed lip breathing techniques or other breathing techniques. ;To learn and understand importance of  maintaining oxygen saturations>88%;To learn and understand importance of monitoring SPO2 with pulse oximeter and demonstrate accurate use of the pulse oximeter. To learn and demonstrate proper pursed lip breathing techniques or other breathing techniques. ;To learn and understand importance of maintaining oxygen saturations>88%;To learn and understand importance of monitoring SPO2 with pulse oximeter and demonstrate accurate use of the pulse oximeter.   Long  Term Goals Exhibits proper breathing techniques, such as pursed lip breathing or other method taught during program session;Compliance with respiratory medication;Maintenance of O2 saturations>88%;Verbalizes importance of monitoring SPO2 with pulse oximeter and return demonstration Exhibits proper breathing techniques, such as pursed lip breathing or other method taught during program session;Compliance with respiratory medication;Maintenance of O2 saturations>88%;Verbalizes importance of monitoring SPO2 with pulse oximeter and return demonstration Exhibits proper breathing techniques, such as pursed lip breathing or other method taught during program session;Compliance with respiratory medication;Maintenance of O2 saturations>88%;Verbalizes importance of monitoring SPO2 with pulse oximeter and return demonstration Exhibits proper breathing techniques, such as pursed lip breathing or other method taught during program session;Compliance with respiratory medication;Maintenance of O2 saturations>88%;Verbalizes importance of monitoring SPO2 with pulse oximeter and return demonstration Exhibits proper breathing techniques, such as pursed lip breathing or other method taught during program session;Compliance with respiratory medication;Maintenance of O2 saturations>88%;Verbalizes importance of monitoring SPO2 with pulse oximeter and return demonstration   Comments Reviewed PLB technique with pt.  Talked about how it works and it's importance in maintaining their  exercise saturations. Asherah is doing well in rehab. She is getting better at PLB and using it more often. She is good about keeping an eye on her breathing at home.  She has ordered a pulse oximeter for home use. Patient reports that she is doing better wither her SOB and using PLB at home to help control this. She stated she does not have pulse ox at home but she plans to order one. Rolena is doing better with her SOB.  She is good about using her PLB to manage her breathing.  She is pleased with how much her breathing has improved.  She had some PVCs over weekend that triggered anixety and SOB and dizziness, but breathing helped.  Today, she was feeling better. Alexyss is doing better with her SOB.  She is good about using her PLB to manage her breathing.  She is pleased with how much her breathing has improved. She states that she is doing well with her respiratory meds as well.   Goals/Expected Outcomes Short: Become more profiecient at using PLB.   Long: Become independent at using PLB. Short; Use pulse oximeter at home Long: Conitnue to improve PLB Short; Use pulse oximeter at home Long: Conitnue to improve PLB Short: Continue to use PLB Long: Conitnue to monitor saturations at home Short: Continue to use PLB Long: Conitnue to monitor saturations at home    [place] Name 06/16/22 0810             Program Oxygen Prescription   Program Oxygen Prescription None         Home Oxygen   Home Oxygen Device None       Sleep Oxygen Prescription None       Home Exercise Oxygen Prescription None       Home Resting Oxygen Prescription None       Compliance with Home Oxygen Use Yes         Goals/Expected Outcomes   Short Term Goals To learn and demonstrate proper pursed lip breathing techniques or other breathing techniques. ;To learn and understand importance of maintaining oxygen saturations>88%;To learn and understand importance of monitoring SPO2 with pulse oximeter and demonstrate accurate use of the  pulse oximeter.       Long  Term Goals Exhibits proper breathing techniques, such as pursed lip breathing or other method taught during program session;Compliance with respiratory medication;Maintenance of O2 saturations>88%;Verbalizes importance of monitoring SPO2 with pulse oximeter and return demonstration       Comments Abreanna continues to check her O2 and HR using her pulse ox. She was encouraged to bring it to rehab to make sure her values are correct. She has no questions using her respiratory meds, she does not always use her Flovent inhaler as it  makes her jittery. She told doctor and they are aware. She continues to use PLB when she needs to.       Goals/Expected Outcomes Short: Check pulse ox at home/rehab Long: Maintain O2 above 88% or whatever value per MD                Oxygen Discharge (Final Oxygen Re-Evaluation):  Oxygen Re-Evaluation - 06/16/22 0810       Program Oxygen Prescription   Program Oxygen Prescription None      Home Oxygen   Home Oxygen Device None    Sleep Oxygen Prescription None    Home Exercise Oxygen Prescription None    Home Resting Oxygen Prescription None    Compliance with Home Oxygen Use Yes      Goals/Expected Outcomes   Short Term Goals To learn and demonstrate proper pursed lip breathing techniques or other breathing techniques. ;To learn and understand importance of maintaining oxygen saturations>88%;To learn and understand importance of monitoring SPO2 with pulse oximeter and demonstrate accurate use of the pulse oximeter.    Long  Term Goals Exhibits proper breathing techniques, such as pursed lip breathing or other method taught during program session;Compliance with respiratory medication;Maintenance of O2 saturations>88%;Verbalizes importance of monitoring SPO2 with pulse oximeter and return demonstration    Comments Tashawna continues to check her O2 and  HR using her pulse ox. She was encouraged to bring it to rehab to make sure her values  are correct. She has no questions using her respiratory meds, she does not always use her Flovent inhaler as it  makes her jittery. She told doctor and they are aware. She continues to use PLB when she needs to.    Goals/Expected Outcomes Short: Check pulse ox at home/rehab Long: Maintain O2 above 88% or whatever value per MD             Initial Exercise Prescription:  Initial Exercise Prescription - 02/17/22 1000       Date of Initial Exercise RX and Referring Provider   Date 02/17/22    Referring Provider Despina Arias MD      Oxygen   Maintain Oxygen Saturation 88% or higher      Treadmill   MPH 2.2    Grade 1    Minutes 15    METs 2.99      Recumbant Bike   Level 3    RPM 50    Watts 25    Minutes 15    METs 3      NuStep   Level 3    SPM 80    Minutes 15    METs 3      Track   Laps 35    Minutes 15    METs 2.9      Prescription Details   Frequency (times per week) 2    Duration Progress to 30 minutes of continuous aerobic without signs/symptoms of physical distress      Intensity   THRR 40-80% of Max Heartrate 103-138    Ratings of Perceived Exertion 11-13    Perceived Dyspnea 0-4      Progression   Progression Continue to progress workloads to maintain intensity without signs/symptoms of physical distress.      Resistance Training   Training Prescription Yes    Weight 3 lb    Reps 10-15             Perform Capillary Blood Glucose checks as needed.  Exercise Prescription Changes:   Exercise Prescription Changes     Row Name 02/17/22 1000 03/01/22 0700 03/15/22 0800 03/28/22 1400 04/11/22 1500     Response to Exercise   Blood Pressure (Admit) 136/64 132/82 112/60 126/64 116/64   Blood Pressure (Exercise) 134/72 142/72 132/70 122/68 --   Blood Pressure (Exit) 122/62 128/72 126/64 104/70 106/68   Heart Rate (Admit) 68 bpm 83 bpm 71 bpm 86 bpm 75 bpm   Heart Rate (Exercise) 103 bpm 94 bpm 87 bpm 88 bpm 97 bpm   Heart Rate (Exit) 75  bpm 79 bpm 85 bpm 77 bpm 82 bpm   Oxygen Saturation (Admit) 96 % 94 % 95 % 92 % 95 %   Oxygen Saturation (Exercise) 85 % 91 % 92 % 91 % 88 %   Oxygen Saturation (Exit) 95 % 93 % 90 % 96 % 92 %   Rating of Perceived Exertion (Exercise) _0 Perceived Dyspnea (Exercise) _1 Symptoms SOB, dizzy on turns SOB SOB SOB SOB   Comments walk test results 2nd full day of exercise -- -- --   Duration -- Progress to 30 minutes of  aerobic without signs/symptoms of physical distress Continue with 30 min of aerobic exercise without signs/symptoms of physical distress. Continue with 30 min of aerobic exercise without  signs/symptoms of physical distress. Continue with 30 min of aerobic exercise without signs/symptoms of physical distress.   Intensity -- THRR unchanged THRR unchanged THRR unchanged THRR unchanged     Progression   Progression -- Continue to progress workloads to maintain intensity without signs/symptoms of physical distress. Continue to progress workloads to maintain intensity without signs/symptoms of physical distress. Continue to progress workloads to maintain intensity without signs/symptoms of physical distress. Continue to progress workloads to maintain intensity without signs/symptoms of physical distress.   Average METs -- 2.95 3.63 3.65 3.83     Resistance Training   Training Prescription -- Yes Yes Yes Yes   Weight -- 3 lb 3 lb 3 lb 4 lb   Reps -- 10-15 10-15 10-15 10-15     Interval Training   Interval Training -- No No No No     Treadmill   MPH -- 2._0 Grade -- 1 1 3.5 3.5   Minutes -- _1 METs -- 2.99 3.71 4.74 4.75     Recumbant Bike   Level -- _2 3.3   Watts -- 25 -- 17 --   Minutes -- _3 METs -- 2.69 3.1 2.41 2.7     NuStep   Level -- _4 Minutes -- _5 METs -- 3.6 4.1 3.8 4.4     Home Exercise Plan   Plans to continue exercise at -- -- Home (comment)  walking, staff videos Home (comment)   walking, staff videos Home (comment)  walking, staff videos   Frequency -- -- Add 3 additional days to program exercise sessions. Add 3 additional days to program exercise sessions. Add 3 additional days to program exercise sessions.   Initial Home Exercises Provided -- -- 03/15/22 03/15/22 03/15/22     Oxygen   Maintain Oxygen Saturation -- 88% or higher 88% or higher 88% or higher 88% or higher    Row Name 04/25/22 1400 05/10/22 0800 05/25/22 1300 06/06/22 1300 06/21/22 1500     Response to Exercise   Blood Pressure (Admit) 106/68 122/70 102/60 124/72 128/76   Blood Pressure (Exit) 118/72 126/70 122/64 122/64 104/60   Heart Rate (Admit) 67 bpm 75 bpm 74 bpm 72 bpm 64 bpm   Heart Rate (Exercise) 101 bpm 113 bpm 90 bpm 91 bpm 79 bpm   Heart Rate (Exit) 69 bpm 79 bpm 71 bpm 66 bpm 70 bpm   Oxygen Saturation (Admit) 95 % 91 % 91 % 95 % 97 %   Oxygen Saturation (Exercise) 89 % 88 % 91 % 93 % 92 %   Oxygen Saturation (Exit) 95 % 93 % 93 % 96 % 96 %   Rating of Perceived Exertion (Exercise) _6 Perceived Dyspnea (Exercise) _7 Symptoms _8    Duration Continue with 30 min of aerobic exercise without signs/symptoms of physical distress. Continue with 30 min of aerobic exercise without signs/symptoms of physical distress. Continue with 30 min of aerobic exercise without signs/symptoms of physical distress. Continue with 30 min of aerobic exercise without signs/symptoms of physical distress. Continue with 30 min of aerobic exercise without signs/symptoms of physical distress.   Intensity _9      Progression   Progression Continue to progress workloads to maintain intensity without  signs/symptoms of physical distress. Continue to progress workloads to maintain intensity without signs/symptoms of physical distress. Continue to progress workloads to maintain intensity without signs/symptoms of  physical distress. Continue to progress workloads to maintain intensity without signs/symptoms of physical distress. Continue to progress workloads to maintain intensity without signs/symptoms of physical distress.   Average METs 4.83 5.13 4.19 4.3 3.2     Resistance Training   Training Prescription _0    Weight 4 lb 4 lb 4 lb 4 lb 3 lb   Reps 10-15 10-15 10-15 10-15 10-15     Interval Training   Interval Training _1      Treadmill   MPH -- 3 2.5 -- --   Grade -- 4 2.5 -- --   Minutes -- 15 15 -- --   METs -- 4.95 3.78 -- --     Recumbant Bike   Level -- -- 4 -- 3   Minutes -- -- 15 -- 15   METs -- -- -- -- 2.71     NuStep   Level _2 Minutes _3 METs 5.4 6.2 4.6 5.4 4.4     REL-XR   Level -- 3 -- -- --   Minutes -- 15 -- -- --   METs -- 3.1 -- -- --     Home Exercise Plan   Plans to continue exercise at Home (comment)  walking, staff videos Home (comment)  walking, staff videos Home (comment)  walking, staff videos Home (comment)  walking, staff videos Home (comment)  walking, staff videos   Frequency Add 3 additional days to program exercise sessions. Add 3 additional days to program exercise sessions. Add 3 additional days to program exercise sessions. Add 3 additional days to program exercise sessions. Add 3 additional days to program exercise sessions.   Initial Home Exercises Provided 03/15/22 03/15/22 03/15/22 03/15/22 03/15/22     Oxygen   Maintain Oxygen Saturation 88% or higher 88% or higher 88% or higher 88% or higher 88% or higher    Row Name 07/04/22 1400             Response to Exercise   Blood Pressure (Admit) 106/64       Blood Pressure (Exit) 122/68       Heart Rate (Admit) 55 bpm       Heart Rate (Exercise) 84 bpm       Heart Rate (Exit) 65 bpm       Oxygen Saturation (Admit) 97 %       Oxygen Saturation (Exercise) 93 %       Oxygen Saturation (Exit) 96 %       Rating of Perceived Exertion  (Exercise) 14       Perceived Dyspnea (Exercise) 0       Symptoms None       Duration Continue with 30 min of aerobic exercise without signs/symptoms of physical distress.       Intensity THRR unchanged         Progression   Progression Continue to progress workloads to maintain intensity without signs/symptoms of physical distress.       Average METs 2.99         Resistance Training   Training Prescription Yes       Weight 3 lb       Reps 10-15         Interval Training  Interval Training No         NuStep   Level 3       Minutes 15       METs 4.1         REL-XR   Level 1       Minutes 15       METs 1.8         Track   Laps 36       Minutes 15       METs 2.96         Home Exercise Plan   Plans to continue exercise at Home (comment)  walking, staff videos       Frequency Add 3 additional days to program exercise sessions.       Initial Home Exercises Provided 03/15/22         Oxygen   Maintain Oxygen Saturation 88% or higher                Exercise Comments:   Exercise Comments     Row Name 02/22/22 0814           Exercise Comments First full day of exercise!  Patient was oriented to gym and equipment including functions, settings, policies, and procedures.  Patient's individual exercise prescription and treatment plan were reviewed.  All starting workloads were established based on the results of the 6 minute walk test done at initial orientation visit.  The plan for exercise progression was also introduced and progression will be customized based on patient's performance and goals.                Exercise Goals and Review:   Exercise Goals     Row Name 02/17/22 1112             Exercise Goals   Increase Physical Activity Yes       Intervention Provide advice, education, support and counseling about physical activity/exercise needs.;Develop an individualized exercise prescription for aerobic and resistive training based on initial  evaluation findings, risk stratification, comorbidities and participant's personal goals.       Expected Outcomes Short Term: Attend rehab on a regular basis to increase amount of physical activity.;Long Term: Add in home exercise to make exercise part of routine and to increase amount of physical activity.;Long Term: Exercising regularly at least 3-5 days a week.       Increase Strength and Stamina Yes       Intervention Provide advice, education, support and counseling about physical activity/exercise needs.;Develop an individualized exercise prescription for aerobic and resistive training based on initial evaluation findings, risk stratification, comorbidities and participant's personal goals.       Expected Outcomes Short Term: Increase workloads from initial exercise prescription for resistance, speed, and METs.;Short Term: Perform resistance training exercises routinely during rehab and add in resistance training at home;Long Term: Improve cardiorespiratory fitness, muscular endurance and strength as measured by increased METs and functional capacity (6MWT)       Able to understand and use rate of perceived exertion (RPE) scale Yes       Intervention Provide education and explanation on how to use RPE scale       Expected Outcomes Short Term: Able to use RPE daily in rehab to express subjective intensity level;Long Term:  Able to use RPE to guide intensity level when exercising independently       Able to understand and use Dyspnea scale Yes       Intervention  Provide education and explanation on how to use Dyspnea scale       Expected Outcomes Short Term: Able to use Dyspnea scale daily in rehab to express subjective sense of shortness of breath during exertion;Long Term: Able to use Dyspnea scale to guide intensity level when exercising independently       Knowledge and understanding of Target Heart Rate Range (THRR) Yes       Intervention Provide education and explanation of THRR including how  the numbers were predicted and where they are located for reference       Expected Outcomes Short Term: Able to state/look up THRR;Short Term: Able to use daily as guideline for intensity in rehab;Long Term: Able to use THRR to govern intensity when exercising independently       Able to check pulse independently Yes       Intervention Provide education and demonstration on how to check pulse in carotid and radial arteries.;Review the importance of being able to check your own pulse for safety during independent exercise       Expected Outcomes Short Term: Able to explain why pulse checking is important during independent exercise;Long Term: Able to check pulse independently and accurately       Understanding of Exercise Prescription Yes       Intervention Provide education, explanation, and written materials on patient's individual exercise prescription       Expected Outcomes Short Term: Able to explain program exercise prescription;Long Term: Able to explain home exercise prescription to exercise independently                Exercise Goals Re-Evaluation :  Exercise Goals Re-Evaluation     Row Name 02/22/22 0752 03/01/22 0759 03/15/22 0805 03/28/22 1426 03/29/22 0802     Exercise Goal Re-Evaluation   Exercise Goals Review Increase Physical Activity;Able to understand and use rate of perceived exertion (RPE) scale;Knowledge and understanding of Target Heart Rate Range (THRR);Understanding of Exercise Prescription;Increase Strength and Stamina;Able to check pulse independently;Able to understand and use Dyspnea scale Increase Physical Activity;Increase Strength and Stamina;Understanding of Exercise Prescription Increase Physical Activity;Increase Strength and Stamina;Understanding of Exercise Prescription;Able to understand and use rate of perceived exertion (RPE) scale;Able to understand and use Dyspnea scale;Knowledge and understanding of Target Heart Rate Range (THRR);Able to check pulse  independently Increase Physical Activity;Increase Strength and Stamina;Understanding of Exercise Prescription Increase Physical Activity;Increase Strength and Stamina;Understanding of Exercise Prescription   Comments Reviewed RPE and dyspnea scales, THR and program prescription with pt today.  Pt voiced understanding and was given a copy of goals to take home. Jecenia is doing well to start rehab. She has tolerated level 3 on the T4. She has also done well on the treadmill at 2.2 mph. We will continue to monitor her progress in the program. Naydelin is doing well in rehab.  She is already walking on the days she is not in class at least three times a week.  Reviewed home exercise with pt today.  Pt plans to walk and use staff videos at home for exercise.  Reviewed THR, pulse, RPE, sign and symptoms, pulse oximetery and when to call 911 or MD.  Also discussed weather considerations and indoor options.  Pt voiced understanding. Mikalia is doing well in rehab.  She is up to level 4 on the NuStep and 17 watts on the bike. She has also moved up her treadmill to 3.0 mph!  We will continue to monitor her progress. Sela reports that she has  made improvements in her strength and stamina. Her SOB has also improved with exercise and she is using PLB to control SOB during exersion. She does not have a pulse ox to monitor SaO2 levels but plans to get one.   Expected Outcomes Short: Use RPE daily to regulate intensity. Long: Follow program prescription in THR. Short: continue to increase treadmill speed. Long: Continue to increase MET levels. Short: Continue to walk and try staff videos Long: Conitnue to exercise independently Short: Try 4 lb weights Long: Continue to improve stamina Short: buy a pulse oximeter. Long: become independent with exercise program.    Row Name 04/11/22 1512 04/19/22 0821 04/25/22 1428 05/10/22 0853 05/24/22 0817     Exercise Goal Re-Evaluation   Exercise Goals Review Increase Physical  Activity;Increase Strength and Stamina;Understanding of Exercise Prescription Increase Physical Activity;Increase Strength and Stamina;Understanding of Exercise Prescription Increase Physical Activity;Increase Strength and Stamina;Understanding of Exercise Prescription Increase Physical Activity;Increase Strength and Stamina;Understanding of Exercise Prescription Increase Physical Activity;Increase Strength and Stamina;Understanding of Exercise Prescription   Comments Maham continues to do well in rehab. She has reached working over 4 METS on the T4 Nustep. She increased to 4 lbs for handweights. She is not qutie hitting her THR. Will continue to monitor. Jaelah is doing well in rehab.  Her breathing is starting to feel better too.  She is starting to feel better overall.  She is walking on her off days for 45-60 min depending on breathing. Angelia is doing well in rehab. She recently improved her overall average MET level to 4.83 METs. She also has tolerated level 4 on the T4 for the full 30 minutes of exercise. She has also done well with 4 lb hand weights for resistance training. We will continue to monitor her progress in the program. Analia continues to do well in rehab. She improved her overall average MET level to 5.13 METs. She also increased her incline on the treadmill to 4% while maintaining a speed of 3 mph. She began using the XR machine as well, and tolerated level 3 well. We will continue to monitor her progress in the program. Eleasha is doing well in the program. She states that she is walking three days a week in addition to her two days of rehab. She is also trying to learn yoga for exercise and relaxation. She also states that her endurance has increased since starting the program as well. She is now able to walk about an hour, and she was only able to walk around 30 to 40 min when she first started the program. We will continue to monitor her progress in the program.   Expected Outcomes Short:  Incread load and watts on RB Long: Continue to increase overall MET level Short: Continue to exercise regularly Long: Continue to improve stamina Short: Continue to increase workload on the treadmill. Long: Continue to increase strength and stamina. Short: Continue to increase workload on the treadmill. Long: Continue to increase strength and stamina. Short: Continue to walk on days away from rehab. Long: Continue to increase strength and stamina.    [place] Name 06/06/22 1357 06/16/22 0806 06/21/22 1530 07/04/22 1407       Exercise Goal Re-Evaluation   Exercise Goals Review Increase Physical Activity;Increase Strength and Stamina;Understanding of Exercise Prescription Increase Physical Activity;Increase Strength and Stamina;Understanding of Exercise Prescription Increase Physical Activity;Increase Strength and Stamina;Understanding of Exercise Prescription Increase Physical Activity;Increase Strength and Stamina;Understanding of Exercise Prescription    Comments Leather continues to do well in  rehab. She has tolerated the T4 at level 4. She has also done well with 4 lb hand weights for resistance training. She had an overall average MET level of 4.3 METs. We will continue to monitor her progress in the program. Quintasia continues to exercise at home. She is walking outside 3 days/week for about an hour each time. She does bring her pulse ox with her during  exercise.  She did state she doesn't think it fully functions 100% and she was encouraged to bring it to class so we can check it. Encouraged to keep up with strength training as well. Rhanda continues to come to rehab. She tried out the arm ergometer due to joint pain on the bike and liked it a little better. We will encourage to keep that up to help incorporate a different type of exercise. She has been ranging between level 3 and 4 on the T4 Nustep, hope to see that increase over time. If she is able to without any MSK limitations, we'd like to see her walk  too. RPE and O2 saturations are great. We will continue to monitor Zanya is doing well in rehab.  She improved her overall average MET level to 2.99 METs.  She also was able to walk up to 36 laps on the track.  We will continue to monitor her progress in the program.    Expected Outcomes Short: Continue to try to walk more. Long: Continue to increase strength and stamina. Short: Incorporate more resistance training into exercise routine Long: Continue to exercise independently Short: Build up workload on arm ergometer Long: Contine to increase overall strength and stamina Short: Continue to increase workloads as tolerated. Long: Continue to increase strength and stamina.             Discharge Exercise Prescription (Final Exercise Prescription Changes):  Exercise Prescription Changes - 07/04/22 1400       Response to Exercise   Blood Pressure (Admit) 106/64    Blood Pressure (Exit) 122/68    Heart Rate (Admit) 55 bpm    Heart Rate (Exercise) 84 bpm    Heart Rate (Exit) 65 bpm    Oxygen Saturation (Admit) 97 %    Oxygen Saturation (Exercise) 93 %    Oxygen Saturation (Exit) 96 %    Rating of Perceived Exertion (Exercise) 14    Perceived Dyspnea (Exercise) 0    Symptoms None    Duration Continue with 30 min of aerobic exercise without signs/symptoms of physical distress.    Intensity THRR unchanged      Progression   Progression Continue to progress workloads to maintain intensity without signs/symptoms of physical distress.    Average METs 2.99      Resistance Training   Training Prescription Yes    Weight 3 lb    Reps 10-15      Interval Training   Interval Training No      NuStep   Level 3    Minutes 15    METs 4.1      REL-XR   Level 1    Minutes 15    METs 1.8      Track   Laps 36    Minutes 15    METs 2.96      Home Exercise Plan   Plans to continue exercise at Home (comment)   walking, staff videos   Frequency Add 3 additional days to program exercise  sessions.    Initial Home Exercises Provided 03/15/22  Oxygen   Maintain Oxygen Saturation 88% or higher             Nutrition:  Target Goals: Understanding of nutrition guidelines, daily intake of sodium <1591m, cholesterol <2073m calories 30% from fat and 7% or less from saturated fats, daily to have 5 or more servings of fruits and vegetables.  Education: All About Nutrition: -Group instruction provided by verbal, written material, interactive activities, discussions, models, and posters to present general guidelines for heart healthy nutrition including fat, fiber, MyPlate, the role of sodium in heart healthy nutrition, utilization of the nutrition label, and utilization of this knowledge for meal planning. Follow up email sent as well. Written material given at graduation.   Biometrics:  Pre Biometrics - 02/17/22 1112       Pre Biometrics   Height _0  (1.727 m)    Weight 247 lb 12.8 oz (112.4 kg)    BMI (Calculated) 37.69    Single Leg Stand 4.4 seconds              Nutrition Therapy Plan and Nutrition Goals:  Nutrition Therapy & Goals - 03/07/22 0816       Nutrition Therapy   Diet Heart healthy, low Na, pulmonary MNT    Drug/Food Interactions Statins/Certain Fruits    Protein (specify units) 100g   x1.2/kg adjusted body weight   Fiber 25 grams    Whole Grain Foods 3 servings    Saturated Fats 16 max. grams    Fruits and Vegetables 8 servings/day    Sodium 2 grams      Personal Nutrition Goals   Nutrition Goal ST: try whole wheat bread, try out low prep meals follow MyPlate LT: Follow MyPlate guidelines, 10956Orotein/day, 25g fiber/day    Comments [age].o. F admitted to pulmonary rehab for ILD. PMHx includes connective tissue disease, HTN, GERD, systemic lupus erythematosus, fibromyalgia, burning mouth syndrome, anxiety, PTSD. Relevant medications includes lipitor, biotin, wellbutrin, lamotrigine, vit D, klonopin, hydroxychloroquine, linzess, melatonin,  metformin, omeprazole, miralax, prednisone, trazadone, venlafaxine. PYP Score: 62. Vegetables & Fruits 6/12. Breads, Grains & Cereals 7/12. Red & Processed Meat 12/12. Poultry 0/2. Fish & Shellfish 0/4. Beans, Nuts & Seeds 1/4. Milk & Dairy Foods 5/6. Toppings, Oils, Seasonings & Salt 17/20. Sweets, Snacks & Restaurant Food 6/14. Beverages 8/10.   She does not cook much. B: strawberries, breakfast bar L: peanut butter and banana sandwich S: fruit (grapes) D: chicken salad sandwich, chili, baked chicken. She uses honey wheat bread, she will sometimes cook food and then freeze them such as chilli and beans, she likes sweet potato fries. Drinks: water. Discussed heart healthy eating and pulmonary MNT.      Intervention Plan   Intervention Prescribe, educate and counsel regarding individualized specific dietary modifications aiming towards targeted core components such as weight, hypertension, lipid management, diabetes, heart failure and other comorbidities.    Expected Outcomes Short Term Goal: Understand basic principles of dietary content, such as calories, fat, sodium, cholesterol and nutrients.;Short Term Goal: A plan has been developed with personal nutrition goals set during dietitian appointment.;Long Term Goal: Adherence to prescribed nutrition plan.             Nutrition Assessments:  MEDIFICTS Score Key: ?70 Need to make dietary changes  40-70 Heart Healthy Diet ? 40 Therapeutic Level Cholesterol Diet  [facility]  Picture Your Plate Total Score on Admission 62      Picture Your Plate  Scores: <40 Unhealthy dietary pattern with much room for improvement. 41-50 Dietary pattern unlikely to meet recommendations for good health and room for improvement. 51-60 More healthful dietary pattern, with some room for improvement.  >60 Healthy dietary pattern, although there may be some specific behaviors that could be  improved.   Nutrition Goals Re-Evaluation:  Nutrition Goals Re-Evaluation     [place] Name 03/29/22 [id number] 04/19/22 0827 04/28/22 0808 05/24/22 0821 06/16/22 0828     Goals   Nutrition Goal -- Short: Continue to read food labels. Long: help control risk factors with nutrition changes. -- -- Short: Continue to read food labels. Long: help control risk factors with nutrition changes.   Comment Patient reports that she has been trying to encorporate more whole grains and fruit into her diet. She is working on reading labels to make sure her foods have good amounts of protein and fiber. Chistina is doing well with her diet.  She ate bad when her anxiety kicked up with the PVCs but now she is getting back to it.  She wants to get back down to 160 lb especially if she can come off the steroids.  She is getting back to the habit of reading labels and being aware of her calories and food intake. Yuri reports needing help with soft foods, provided handouts for this as well as reviewed soft foods that are still heart healthy. Natayla would like to know how to enhance flavor of greek yogurt - discussed being mindful of added sugar, but she could make it sweeter with honey, fruit, or peanut butter. If she would like to have it more savory she could add roasted vegetables, pesto, or make a dip like tzatziki. Reviewed heart healthy foods and balanced meals/snacks; Nadira reports still having general heart healthy eating handouts given at initial consultation which includes foods recommended/not recommended, guidelines for general heart healthy eating such as recommended amount of saturated fat, and MyPlate guidelines. -- Yarima has been making some changes in her diet and is really trying to focus on incorporating fruits/veggies into her foods, creatively. She would really like to starting drinking smoothies and requested  recipes. Will touch base with RD to help assist. She is also interested in eating yogurt but wants to know  which ones are lower sugar. She has been trying to lose weight but the steroids she is on has been preventing her from doing that   Expected Outcome Short: Continue to read food labels. Long: help control risk factors with nutrition changes. Short: Get back to diet again after anxiety up kick Long: Continue to follow food label and MyPlate guidelines -- Short: add more vegetables in diet through smoothies. Long: Continue to follow MyPlate guidelines. Short: Touch base with RD on smoothie recipes and yogurt options Long: Continue to follow RD recommendations            Nutrition Goals Discharge (Final Nutrition Goals Re-Evaluation):  Nutrition Goals Re-Evaluation - 06/16/22 0828       Goals   Nutrition Goal Short: Continue to read food labels. Long: help control risk factors with nutrition changes.    Comment Rashad has been making some changes in her diet and is really trying to focus on incorporating fruits/veggies into her foods, creatively. She would really like to starting drinking smoothies and requested  recipes. Will touch base with RD to help assist. She is also interested in eating yogurt but wants to know which ones are lower sugar. She has been trying to  lose weight but the steroids she is on has been preventing her from doing that    Expected Outcome Short: Touch base with RD on smoothie recipes and yogurt options Long: Continue to follow RD recommendations             Psychosocial: Target Goals: Acknowledge presence or absence of significant depression and/or stress, maximize coping skills, provide positive support system. Participant is able to verbalize types and ability to use techniques and skills needed for reducing stress and depression.   Education: Stress, Anxiety, and Depression - Group verbal and visual presentation to define topics covered.  Reviews how body is impacted by stress, anxiety, and depression.  Also discusses healthy ways to reduce stress and to  treat/manage anxiety and depression.  Written material given at graduation. Flowsheet Row Pulmonary Rehab from [facility]/16/20[facility] in [facility] Pulmonary Rehab  Date 02/24/22  Educator [facility]  Instruction Review Code 1- [employer] Understanding       Education: Sleep Hygiene -Provides group verbal and written instruction about how sleep can affect your health.  Define sleep hygiene, discuss sleep cycles and impact of sleep habits. Review good sleep hygiene tips.    Initial Review & Psychosocial Screening:  Initial Psych Review & Screening - 02/02/22 1421       Initial Review   Current issues with Current Psychotropic Meds;Current Depression;Current Anxiety/Panic;Current Stress Concerns    Source of Stress Concerns Chronic Illness    Comments several health issues can cause her to feel down. Tries to focus on the positive.      Family Dynamics   Good Support System? Yes   son and daughter, friend     Barriers   Psychosocial barriers to participate in program There are no identifiable barriers or psychosocial needs.;The patient should benefit from training in stress management and relaxation.      Screening Interventions   Interventions Encouraged to exercise;To provide support and resources with identified psychosocial needs;Provide feedback about the scores to participant    Expected Outcomes Short Term goal: Utilizing psychosocial counselor, staff and physician to assist with identification of specific Stressors or current issues interfering with healing process. Setting desired goal for each stressor or current issue identified.;Long Term Goal: Stressors or current issues are controlled or eliminated.;Short Term goal: Identification and review with participant of any Quality of Life or Depression concerns found by scoring the questionnaire.;Long Term goal: The participant improves quality of Life and PHQ9 Scores as seen by post scores and/or verbalization of changes              Quality of Life Scores:  Scores of 19 and below usually indicate a poorer quality of life in these areas.  A difference of  2-3 points is a clinically meaningful difference.  A difference of 2-3 points in the total score of the Quality of Life Index has been associated with significant improvement in overall quality of life, self-image, physical symptoms, and general health in studies assessing change in quality of life.  PHQ-9: Review Flowsheet  More data exists      06/21/2022 06/06/2022 04/15/2022 04/12/2022 03/15/2022  Depression screen PHQ 2/9  Decreased Interest _0 Down, Depressed, Hopeless _1 PHQ - 2 Score _2 Altered sleeping _3 0 2  Tired, decreased energy _4 Change in appetite _5 0  Feeling bad or failure about yourself  _0 Trouble concentrating 1 0 1 1 0  Moving slowly or fidgety/restless 0 0 0 1 1  Suicidal thoughts 0 0 0 0 0  PHQ-9 Score _1 Difficult doing work/chores Somewhat difficult - - Very difficult Somewhat difficult   Interpretation of Total Score  Total Score Depression Severity:  1-4 = Minimal depression, 5-9 = Mild depression, 10-14 = Moderate depression, 15-19 = Moderately severe depression, 20-27 = Severe depression   Psychosocial Evaluation and Intervention:  Psychosocial Evaluation - 02/02/22 1435       Psychosocial Evaluation & Interventions   Interventions Encouraged to exercise with the program and follow exercise prescription    Comments Char has no barriers to attending the program. She lives alone. She has a son and a daughter that are her support along with her firends. She is on meds for depression and anxiety. She has multiple illnesses and it can bring her down at times. She tries hard to look at the bright side to prevent being pulled down. She wants to see if she can help her lungs heal as much as possible. She is reday to get started.    Expected Outcomes STG Valla is able to  attend all scheduled sessions. She learns rlaxation and de stress exercises while here. LTG Niajah is able to continue her exercise progression and manage her stress and depression easier.    Continue Psychosocial Services  Follow up required by staff             Psychosocial Re-Evaluation:  Psychosocial Re-Evaluation     [place] Name 03/15/22 0813 03/29/22 4158 04/19/22 0825 05/12/22 0749 06/16/22 0818     Psychosocial Re-Evaluation   Current issues with Current Psychotropic Meds;History of Depression;Current Anxiety/Panic;Current Depression;Current Sleep Concerns Current Psychotropic Meds;History of Depression;Current Anxiety/Panic;Current Depression;Current Sleep Concerns Current Psychotropic Meds;History of Depression;Current Anxiety/Panic;Current Depression;Current Sleep Concerns Current Psychotropic Meds;History of Depression;Current Anxiety/Panic;Current Depression;Current Sleep Concerns Current Psychotropic Meds;History of Depression;Current Anxiety/Panic;Current Depression;Current Sleep Concerns   Comments Jazz is doing well mentally.  She feels that she is doing a little better with her depression currently as she is is just feeling better overall.  Her PHQ has improved by 2 pts over last couple of weeks. She has been struggling with sleep recently for both going to sleep and waking up.  She lays in bed for a while before going to sleep.  She wakes early and unable to go back to sleep.  She takes melatonin each nigh in tablet form.  She is going to try to up her dose some and take 30 min before bed.   She is feeling well managed on her meds.  She does get frustrated with feeling like she still gets SOB. Patient reports that she is going well mentally. She is still on treatment but everything has been stable with no new concerns. She has reported that her SOB is improving since starting the program. Jaleiah had a scare over weekend with PVCs and went to ED.  It triggered her anxiety and still  trying to calm down.  She has a referral to cardiology and an appointment with pulmonary tomorrow.  She is sleeping good. She just wants to find out why they are happening and to make sure she's okay.  Her son has been worried about her and keeps calling to check in on her. Reviewed patient health questionnaire (PHQ-9) with patient for follow up. Previously, patients score indicated signs/symptoms of depression.  Reviewed to see if patient is improving symptom wise while in program.  Score stayed the same and patient states that it is because she has been having little energy. Harley has been having a tough couple past weeks mentally. Last week when coming to rehab, she was very stressed with all of her appointments on top of her rehab and thought about ending the program. After talk to staff, she felt much better and decided to stay in the program as she feels it is truly helping her. As of today, she states her mental health is much better. She talked with her ENT about her lingering cough and they are going to try to proceed with injections to help with her hoarseness. This conversation calmed her down.  When asked about how she copes with her stress, she states she talks to herself, talks with her children who are great support, as well as her doctors. She is taking all of her medications for depression/anxiety and decline any problems with them. She recently  saw her pain management doctor for her fibromyalgia and switched some medications around and hoping her pain will subside a litt bit. She does see a therapist and psyhiatrist virtually once/month and says it is helping her- with no complaints. Told her if she feels she needs extra assistance or help with mental health to reach out to staff, doctors to learn about any other resources, which he also went over as other options as needed. Patient appreciated long conversation. Declined other questions or concerns. We will repeat PHQ.   Expected Outcomes  Short: Try to increase melatonin to help with sleep Long: Continue to focus on positive and exercies for mental boost. Short: continue in current treatment. Long: continue to work on positive mental health  behaviors. Short: Talk to doctor about PVCs  Long: Continue to focus on positive Short: Continue to work toward an improvement in PHQ9 scores by attending [facility] regularly. Long: Continue to improve stress and depression coping skills by talking with staff and attending [facility] regularly and work toward a positive mental state. Short: Repeat PHQ and continue to utilize therapy/doctors/family/staff to help cope with mental health, reach out if need extra resources Long: Continue to maintain positive attitude, practice relaxation   Interventions Encouraged to attend Pulmonary Rehabilitation for the exercise;Stress management education Encouraged to attend Pulmonary Rehabilitation for the exercise;Stress management education Encouraged to attend Pulmonary Rehabilitation for the exercise;Stress management education Encouraged to attend Pulmonary Rehabilitation for the exercise Encouraged to attend Pulmonary Rehabilitation for the exercise   Continue Psychosocial Services  Follow up required by staff Follow up required by staff -- Follow up required by staff Follow up required by staff    [place] Name 06/21/22 0731             Psychosocial Re-Evaluation   Current issues with Current Psychotropic Meds;History of Depression;Current Anxiety/Panic;Current Depression;Current Sleep Concerns       Comments Juanell improved her PHQ from at 14 to an 8. Overall, she feels she is doing better mentally and the PHQ reflects that. Denies needing any other resources at time. Encouraged her to reach out if anything changes.       Expected Outcomes Short: Continue utilizing resources and stay complianet medications Long: Continue to maintain positive attitiude, practice relaxation       Interventions  Encouraged to attend Pulmonary Rehabilitation for the exercise       Continue Psychosocial Services  Follow up required by staff  Psychosocial Discharge (Final Psychosocial Re-Evaluation):  Psychosocial Re-Evaluation - 06/21/22 0731       Psychosocial Re-Evaluation   Current issues with Current Psychotropic Meds;History of Depression;Current Anxiety/Panic;Current Depression;Current Sleep Concerns    Comments Alaa improved her PHQ from at 14 to an 8. Overall, she feels she is doing better mentally and the PHQ reflects that. Denies needing any other resources at time. Encouraged her to reach out if anything changes.    Expected Outcomes Short: Continue utilizing resources and stay complianet medications Long: Continue to maintain positive attitiude, practice relaxation    Interventions Encouraged to attend Pulmonary Rehabilitation for the exercise    Continue Psychosocial Services  Follow up required by staff             Education: Education Goals: Education classes will be provided on a weekly basis, covering required topics. Participant will state understanding/return demonstration of topics presented.  Learning Barriers/Preferences:   General Pulmonary Education Topics:  Infection Prevention: - Provides verbal and written material to individual with discussion of infection control including proper hand washing and proper equipment cleaning during exercise session. Flowsheet Row Pulmonary Rehab from 06/16/2022 in [facility] Cardiac and Pulmonary Rehab  Date 02/17/22  Educator [facility]  Instruction Review Code 1- Verbalizes Understanding       Falls Prevention: - Provides verbal and written material to individual with discussion of falls prevention and safety. Flowsheet Row Pulmonary Rehab from 06/16/2022 in [facility] Cardiac and Pulmonary Rehab  Date 02/02/22  Educator SB  Instruction Review Code 1- Verbalizes Understanding       Chronic Lung Disease Review: -  Group verbal instruction with posters, models, PowerPoint presentations and videos,  to review new updates, new respiratory medications, new advancements in procedures and treatments. Providing information on websites and "800" numbers for continued self-education. Includes information about supplement oxygen, available portable oxygen systems, continuous and intermittent flow rates, oxygen safety, concentrators, and Medicare reimbursement for oxygen. Explanation of Pulmonary Drugs, including class, frequency, complications, importance of spacers, rinsing mouth after steroid MDI's, and proper cleaning methods for nebulizers. Review of basic lung anatomy and physiology related to function, structure, and complications of lung disease. Review of risk factors. Discussion about methods for diagnosing sleep apnea and types of masks and machines for OSA. Includes a review of the use of types of environmental controls: home humidity, furnaces, filters, dust mite/pet prevention, HEPA vacuums. Discussion about weather changes, air quality and the benefits of nasal washing. Instruction on Warning signs, infection symptoms, calling MD promptly, preventive modes, and value of vaccinations. Review of effective airway clearance, coughing and/or vibration techniques. Emphasizing that all should Create an Action Plan. Written material given at graduation. Flowsheet Row Pulmonary Rehab from 06/16/2022 in [facility] Cardiac and Pulmonary Rehab  Education need identified 02/17/22  Date 02/17/22  Educator [facility]  Instruction Review Code 1- Verbalizes Understanding       AED/CPR: - Group verbal and written instruction with the use of models to demonstrate the basic use of the AED with the basic ABC's of resuscitation.    Anatomy and Cardiac Procedures: - Group verbal and visual presentation and models provide information about basic cardiac anatomy and function. Reviews the testing methods done to diagnose heart disease and the  outcomes of the test results. Describes the treatment choices: Medical Management, Angioplasty, or Coronary Bypass Surgery for treating various heart conditions including Myocardial Infarction, Angina, Valve Disease, and Cardiac Arrhythmias.  Written material given at graduation.   Medication Safety: - Group verbal and visual  instruction to review commonly prescribed medications for heart and lung disease. Reviews the medication, class of the drug, and side effects. Includes the steps to properly store meds and maintain the prescription regimen.  Written material given at graduation.   Other: -Provides group and verbal instruction on various topics (see comments)   Knowledge Questionnaire Score:  Knowledge Questionnaire Score - 02/17/22 1114       Knowledge Questionnaire Score   Pre Score 12/18              Core Components/Risk Factors/Patient Goals at Admission:  Personal Goals and Risk Factors at Admission - 02/17/22 1114       Core Components/Risk Factors/Patient Goals on Admission    Weight Management Yes;Weight Loss;Obesity    Intervention Weight Management: Develop a combined nutrition and exercise program designed to reach desired caloric intake, while maintaining appropriate intake of nutrient and fiber, sodium and fats, and appropriate energy expenditure required for the weight goal.;Weight Management/Obesity: Establish reasonable short term and long term weight goals.;Obesity: Provide education and appropriate resources to help participant work on and attain dietary goals.;Weight Management: Provide education and appropriate resources to help participant work on and attain dietary goals.    Admit Weight 247 lb 12.8 oz (112.4 kg)    Goal Weight: Short Term 243 lb (110.2 kg)    Goal Weight: Long Term 200 lb (90.7 kg)    Expected Outcomes Short Term: Continue to assess and modify interventions until short term weight is achieved;Weight Loss: Understanding of general  recommendations for a balanced deficit meal plan, which promotes 1-2 lb weight loss per week and includes a negative energy balance of [phone] kcal/d;Understanding recommendations for meals to include 15-35% energy as protein, 25-35% energy from fat, 35-60% energy from carbohydrates, less than 222m of dietary cholesterol, 20-35 gm of total fiber daily;Understanding of distribution of calorie intake throughout the day with the consumption of 4-5 meals/snacks    Improve shortness of breath with ADL's Yes    Intervention Provide education, individualized exercise plan and daily activity instruction to help decrease symptoms of SOB with activities of daily living.    Expected Outcomes Short Term: Improve cardiorespiratory fitness to achieve a reduction of symptoms when performing ADLs;Long Term: Be able to perform more ADLs without symptoms or delay the onset of symptoms    Increase knowledge of respiratory medications and ability to use respiratory devices properly  Yes    Intervention Provide education and demonstration as needed of appropriate use of medications, inhalers, and oxygen therapy.    Expected Outcomes Short Term: Achieves understanding of medications use. Understands that oxygen is a medication prescribed by physician. Demonstrates appropriate use of inhaler and oxygen therapy.;Long Term: Maintain appropriate use of medications, inhalers, and oxygen therapy.    Hypertension Yes    Intervention Provide education on lifestyle modifcations including regular physical activity/exercise, weight management, moderate sodium restriction and increased consumption of fresh fruit, vegetables, and low fat dairy, alcohol moderation, and smoking cessation.;Monitor prescription use compliance.    Expected Outcomes Short Term: Continued assessment and intervention until BP is < 140/966mHG in hypertensive participants. < 130/8046mG in hypertensive participants with diabetes, heart failure or chronic kidney  disease.;Long Term: Maintenance of blood pressure at goal levels.    Lipids Yes    Intervention Provide education and support for participant on nutrition & aerobic/resistive exercise along with prescribed medications to achieve LDL <25m72mDL >40mg67m Expected Outcomes Short Term: Participant states understanding of desired cholesterol  values and is compliant with medications prescribed. Participant is following exercise prescription and nutrition guidelines.;Long Term: Cholesterol controlled with medications as prescribed, with individualized exercise RX and with personalized nutrition plan. Value goals: LDL < 21m, HDL > 40 mg.             Education:Diabetes - Individual verbal and written instruction to review signs/symptoms of diabetes, desired ranges of glucose level fasting, after meals and with exercise. Acknowledge that pre and post exercise glucose checks will be done for 3 sessions at entry of program.   Know Your Numbers and Heart Failure: - Group verbal and visual instruction to discuss disease risk factors for cardiac and pulmonary disease and treatment options.  Reviews associated critical values for Overweight/Obesity, Hypertension, Cholesterol, and Diabetes.  Discusses basics of heart failure: signs/symptoms and treatments.  Introduces Heart Failure Zone chart for action plan for heart failure.  Written material given at graduation.   Core Components/Risk Factors/Patient Goals Review:   Goals and Risk Factor Review     Row Name 03/15/22 0823 03/29/22 0809 04/19/22 0822 05/24/22 0823 06/16/22 0805     Core Components/Risk Factors/Patient Goals Review   Personal Goals Review Weight Management/Obesity;Increase knowledge of respiratory medications and ability to use respiratory devices properly.;Improve shortness of breath with ADL's;Hypertension;Lipids Weight Management/Obesity;Increase knowledge of respiratory medications and ability to use respiratory devices  properly.;Improve shortness of breath with ADL's;Hypertension;Lipids Weight Management/Obesity;Increase knowledge of respiratory medications and ability to use respiratory devices properly.;Improve shortness of breath with ADL's;Hypertension;Lipids Weight Management/Obesity;Increase knowledge of respiratory medications and ability to use respiratory devices properly.;Improve shortness of breath with ADL's;Hypertension;Lipids Weight Management/Obesity;Increase knowledge of respiratory medications and ability to use respiratory devices properly.;Improve shortness of breath with ADL's;Hypertension   Review SAnoushkais doing well in rehab.  Her weight is trending down.  She is down to 246 lb today.  She is still getting SOB some days.  Today, was the first day that she felt winded before class and during class, so she knows that it is getting better overall.  She is on prednisone and doing well with her inhalers to help her breathing.  Her pressures continue to do well and she is checking them three times a week at home. SSiobahnreports that she has had steady weight loss of about 1 lb a week. She montiors her BP at home and reports that they are in acceptable ranges. She also reports improvements in SOB and reports using PLB to help control this. SAshelynwas in ED over weekend for PVCs and now has a referral for cardiology to look at them.  She was SOB and dizziheaded.  They talked about her doing a Zio patch to review.  Her breathing is feeling better since when she started.  She is now able to do more before getting SOB.  She is doing well on her respiratory meds.  She is doing well with her pressures.  Her weight continues to trend down.  They talked about changing some meds to include a beta blocker to help with heart. SKimethais still looing to lose some weight. She stated that she would like to lose around 50 lbs as a long term goal. SMarkettastates that she has not been checking her BP at home, but has the resources  to do so. She is doing well with her respiratory meds as well. Her SOB with exercise has improved compared to when she started the program also. STaianahas been frustrated with an on-going cough. She saw ENT  and is supposed to be getting injections and meds to help with hoarsness. They are not sure if it will help but wants to try. She was advised to ask her doctor if she is OK to exercise directly after. She has been checking her weight and has lost a couple lbs, but has had some low appetite. She has been feeling better and thinks it has improved. She has been on steroids for months, prednisone which prevents her from losing weight. She wants to talk with with her doctor about how much longer Overall, she feels her SOB has improved since starting the program. She does not check her BP at home but has stable BPs at rehab. Talked about the importance. She continues to take all her medications as prescribed, including her myfortic which she feels helps her.   Expected Outcomes Short: Continue to work on breathing Long: Conitnue to work on [employer] loss Short: continue to use PLB to help control SOB. Long: continue to work on weight loss goal. Short: Get cardiology appt to see about PVCs  Long; Continue to monitor risk factors. Short: Check BP at home more regularly. Long: Continue to monitor lifestyle risk factors. Short: Talk to doctor about medications Long: Contonue to manage risk factors            Core Components/Risk Factors/Patient Goals at Discharge (Final Review):   Goals and Risk Factor Review - 06/16/22 0805       Core Components/Risk Factors/Patient Goals Review   Personal Goals Review Weight Management/Obesity;Increase knowledge of respiratory medications and ability to use respiratory devices properly.;Improve shortness of breath with ADL's;Hypertension    Review Lawrencia has been frustrated with an on-going cough. She saw ENT and is supposed to be getting injections and meds to help with  hoarsness. They are not sure if it will help but wants to try. She was advised to ask her doctor if she is OK to exercise directly after. She has been checking her weight and has lost a couple lbs, but has had some low appetite. She has been feeling better and thinks it has improved. She has been on steroids for months, prednisone which prevents her from losing weight. She wants to talk with with her doctor about how much longer Overall, she feels her SOB has improved since starting the program. She does not check her BP at home but has stable BPs at rehab. Talked about the importance. She continues to take all her medications as prescribed, including her myfortic which she feels helps her.    Expected Outcomes Short: Talk to doctor about medications Long: Contonue to manage risk factors             ITP Comments:  ITP Comments     Row Name 02/02/22 1433 02/17/22 1044 02/22/22 0752 03/07/22 0816 03/16/22 0953   ITP Comments Virtual orientation call completed today. shehas an appointment on Date: 02/09/2022  for EP eval and gym Orientation.  Documentation of diagnosis can be found in [facility] Date: 12/29/2021 . Completed 6MWT and gym orientation. Initial ITP created and sent for review to Dr. Zetta Bills, Medical Director. First full day of exercise!  Patient was oriented to gym and equipment including functions, settings, policies, and procedures.  Patient's individual exercise prescription and treatment plan were reviewed.  All starting workloads were established based on the results of the 6 minute walk test done at initial orientation visit.  The plan for exercise progression was also introduced and progression will be customized  based on patient's performance and goals. Completed initial RD consultation 30 Day review completed. Medical Director ITP review done, changes made as directed, and signed approval by Medical Director.    Row Name 04/13/22 0754 05/11/22 0821 06/08/22 0739 07/06/22 0952  07/07/22 0736   ITP Comments 30 Day review completed. Medical Director ITP review done, changes made as directed, and signed approval by Medical Director. 30 Day review completed. Medical Director ITP review done, changes made as directed, and signed approval by Medical Director. 30 Day review completed. Medical Director ITP review done, changes made as directed, and signed approval by Medical Director. 30 Day review completed. Medical Director ITP review done, changes made as directed, and signed approval by Medical Director. Katharine Look sent [place] a message to discharge her at this time due to cost.            Comments: Discharge ITP

## 2022-07-09 ENCOUNTER — Encounter: Payer: Self-pay | Admitting: Family Medicine

## 2022-07-11 NOTE — Unmapped (Signed)
Pulmonary Clinic - Follow-up Visit      HISTORY:     Active Pulmonary Problems & Brief History:  Caitlin Smith is a 66 y.o. woman with many medical issues here for follow up of follow up for organizing pneumonia.     Interval History:  - 2016 and prior - patient followed at [facility]. They thought her symptoms of dyspnea were from obesity and asthma and recommended ENT evaluation. They also stated concern for chronic aspiration.   - 04/2016 - patient in [facility] system. She was thought to have multifactorial dyspnea, waxing and waning pulmonary opacities. Evaluated by pulmonary hypertension team without evidence of pulmonary hypertension. Underwent bronchoscopy with BAL and transbronchial biopsies which showed significant inflammation. DDX remained broad - CEP, COP, SLE pneumonitis, ABPA, EGPA, AEP, but it was decided to treat her with prolonged steroid taper.   - 08/23/16 - last visit with Dr. Johnnette Barrios - was improving with steroid taper   - 10/20/16 - established with Dr. Nilsa Nutting - there had been question of compliance with her prednisone since last visit, but by time of visit she had been on it consistently for one month. No clear improvement in dyspnea, but remains inactive.   - 02/09/17 - follow up - spirometry showed improvement in FVC and DLCOafter 2 months of steroid, had tapered off by time of visit. When she stopped the prednisone she developed increased cough, chest tightness, dyspnea.   - 03/27/17 - follow up - given steroid dependence was placed on MMF.   - 10/16/17 - follow up - dose reduced MMF for GI side effects. Prolonged steroid taper over this time.   - 02/06/2019 - last visit with pulmonary, Dr. Judeth Horn - at that time had gone to local ED for chest pain, CT chest showed interstitial lung disease in the lung bases, favored to reflect mild progression of chronic fibrotic nonspecific interstitial pneumonia (NSIP). It was unclear to Dr. Judeth Horn if that represented actual progression of her underlying disease.   - 03/03/21 - patient reports she has dyspnea on exertion not associated with chest pain. Not currently using any inhalers. Still has some chronic cough, but ENT thinks she's having reflux so has her on PPI + H2 blocker. Cough non-productive. No F/C/NS, N/V/D, upset tummy. Sleeps laying flat, no nocturnal awakenings. Reports negative sleep studies for OSA. No LE edema. Wanted to repeat PFTs in 4 months given mildly reduced compared to prior. If stable/improved at next visit then would reach out to rheum to see about weaning MMF.   - 06/08/21 - ENT - larynx healthy on laryngoscopy. Referred to GI for GI discomfort and constipation.   - 07/04/21 - follow up - had been increasing her exercise up to 50 minutes daily until 2 weeks prior to visit when breathing worsened again, now having dyspnea with minimal exertion. Sent for CT chest, , chest fluoroscopy. CT chest slightly worse but not significantly different, TTE without obvious pathology to cause symptoms. Chest fluoroscopy with elevated left hemidiaphragm and diminished excursion compared to right but not fully paralyzed, without hypoxemia.   - 09/13/20 - follow up - had URI over Christmas and New Year treat with doxycycline and followed with lingering hoarseness, productive cough. No F/C, some DOE, night sweats. Increased MMF.   - 12/29/21 - follow up - very easily dyspneic, even just walking to the car or climbing 12 stairs into house. Less active overall. Not as much coughing, allergies are improved. No heartburn with acid reflux medication.   -  02/16/22 - phone visit - PFTs improved from April, continued pred 10mg  daily, MMF 720 BID, HCQ 200mg  BID.   - 04/20/22 - follow up - some palpitations, some dyspnea, some dry cough associated with nausea and dizziness. No clubbing or LE edema. MMF 720mg  BID, HCQ 200mg  BID, prednisone 7.5mg  daily.   - 06/09/22 - ENT - PPI ineffective for cough.Trial of right SLN injection for neurogenic cough.  - 09/12/21 - had been doing pulmonary rehab in [place] which she has now finished and is exercising 5 days per week at home, walking an hour a day. Is having a lot of coughing which reduces appetite. Occurs throughout day but worst at night. No sputum. No change in congestion or sinus complaints to explain cough. Did not notice a difference in cough when       Past Medical History: The medical and surgical history were personally reviewed and updated in the patient's electronic medical record. Pertinent positives are documented above.  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 Pertinent positives are documented above.  Home Medications: Medications were reviewed and updated in the patient's electronic medical record. Pertinent positives are documented above.   Allergies: Allergies were reviewed and updated in the patient's electronic medical record. Pertinent positives are documented above.  Review of Systems: A comprehensive review of systems was completed and negative except as noted in HPI.    PHYSICAL EXAM:     Vitals:    07/13/22 0813   BP: 130/77   Pulse: 64   Temp: 36.2 ??C (97.2 ??F)   SpO2: 99%       General: pleasant individual appearing stated age, alert and oriented, no acute distress  HEENT: trachea midline, supple  CV: RRR, no m/r/g  Lungs: CTAB, good air movement throughout   Abd: Soft, NT/ND, no rebound or guarding  Ext: Warm, well perfused, no peripheral edema  Skin: No rashes, skin breakdown, or wounds  Neuro: CN II-XII intact to conversation, alert and oriented.     LABORATORY and RADIOLOGY DATA:     Pulmonary Function Tests/Interpretation:  02/16/22              Pertinent Laboratory Data:  02/08/22 - Hg 13.4, absolute lymphocyte 3.8    04/25/16 BAL and TBBX   Cultures negative except for OPF     Cell count  PMN 31%  Lymph 11%  Macs 43%  Eos 7%  Baso 6%     Pathology   A: Lung, right lower lobe, endobronchial biopsy   - DIP-like reaction pattern with admixed eosinophils, consistent with eosinophilic pneumonia (see Micro Exam)  - Patchy organizing pneumonia  - Interstitial lymphoplasmacytic infiltrate, polytypic  - Partially denuded benign respiratory mucosa, without viral cytopathic effect  - No malignant neoplasm identified   - GMS and  AFB stains negative for fungi or AFB    Cytology  Lung, Bronchial lavage:  - No malignant cells identified.   -  Alveolar macrophages and mixed inflammation.     Pertinent Imaging Data:  06/29/22 CXR  Hypoinflated lungs with no acute abnormalities.     08/02/21 HRCT Chest  Slight worsening of fibrotic changes related to  interstitial lung disease associated with connective tissue disease.       Resolution of right lower lobe consolidation with central clearance consistent with organizing pneumonia.     08/26/21 Sniff Test  Elevated left hemidiaphragm with slightly diminished excursion relative to the right but no diaphragmatic paralysis.    Pertinent Cardiac Data:  05/26/22 Zio   Patient had a min HR of 52 bpm, max HR of 123 bpm, and avg HR of 67 bpm. Predominant underlying rhythm was Sinus Rhythm. Isolated SVEs were rare (<1.0%), and no SVE Couplets or SVE Triplets were present. Isolated VEs were rare (<1.0%), and no VE Couplets or VE Triplets were present.   Symptoms were associated with sinus rhythm and also isolated VEs.     08/27/21 TTE     1. The left ventricular systolic function is normal, LVEF is visually  estimated at 60-65%.    2. The right ventricle is normal in size, with normal systolic function.    3. The pulmonary systolic pressure cannot be estimated due to insufficient  TR signal.      ASSESSMENT and PLAN     Problem List  Connective Tissue Disease related Interstitial Lung Disease (CTD-ILD)  Cryptogenic Organizing Pneumonia (COP)  SLE   GERD  BMI 37   Left hemidiaphragm elevation    Assessment  PFTs and dyspnea has stabilized on prednisone 7.5mg  daily, MMF 720mg  BID, and HCQ 200mg  BID. No changes today. Repeat visit with PFTs in 3 months. If doing well drop prednisone to 5mg  daily then potentially off in future.    Wonder if she has component of shrinking lung syndrome based on restriction on PFTs, high diaphragms, and lack of extensive fibrosis on her CT. This would be treated with immunosuppression regardless.     She is doing well with exercise. Some concern on her end she is desaturating with exertion so will get formal to assess.     I am uncertain what is driving her worsened cough. Will see if ENT intervention is helpful. Encouraged GERD treatment with PPI, H2 blocker, lifestyle changes. Sinuses not a huge problem currently. No concern for asthma at this time.     Plan  - Continue mycophenolate 720mg  BID, HCQ 200mg  BID per rheumatology  - Continue prednisone 7.5mg  daily  - Cough as above    The patient was seen with Dr. Lisabeth Devoid and will return to clinic in 3 months.    Layla Maw MD  Pulmonary and Critical Care Fellow

## 2022-07-12 ENCOUNTER — Encounter: Payer: Self-pay | Admitting: Family Medicine

## 2022-07-12 ENCOUNTER — Ambulatory Visit (INDEPENDENT_AMBULATORY_CARE_PROVIDER_SITE_OTHER): Payer: Medicare Other | Admitting: Family Medicine

## 2022-07-12 VITALS — BP 126/70 | HR 75 | Temp 98.2°F | Resp 18 | Ht 67.0 in | Wt 233.6 lb

## 2022-07-12 DIAGNOSIS — E785 Hyperlipidemia, unspecified: Secondary | ICD-10-CM

## 2022-07-12 DIAGNOSIS — J3089 Other allergic rhinitis: Secondary | ICD-10-CM

## 2022-07-12 DIAGNOSIS — R110 Nausea: Secondary | ICD-10-CM

## 2022-07-12 DIAGNOSIS — Z9889 Other specified postprocedural states: Secondary | ICD-10-CM

## 2022-07-12 DIAGNOSIS — J849 Interstitial pulmonary disease, unspecified: Secondary | ICD-10-CM

## 2022-07-12 DIAGNOSIS — I10 Essential (primary) hypertension: Secondary | ICD-10-CM

## 2022-07-12 DIAGNOSIS — E236 Other disorders of pituitary gland: Secondary | ICD-10-CM | POA: Insufficient documentation

## 2022-07-12 DIAGNOSIS — R29818 Other symptoms and signs involving the nervous system: Secondary | ICD-10-CM

## 2022-07-12 DIAGNOSIS — J302 Other seasonal allergic rhinitis: Secondary | ICD-10-CM

## 2022-07-12 DIAGNOSIS — R059 Cough, unspecified: Secondary | ICD-10-CM

## 2022-07-12 DIAGNOSIS — J8410 Pulmonary fibrosis, unspecified: Secondary | ICD-10-CM | POA: Diagnosis not present

## 2022-07-12 DIAGNOSIS — F339 Major depressive disorder, recurrent, unspecified: Secondary | ICD-10-CM | POA: Diagnosis not present

## 2022-07-12 DIAGNOSIS — D849 Immunodeficiency, unspecified: Secondary | ICD-10-CM

## 2022-07-12 DIAGNOSIS — M797 Fibromyalgia: Secondary | ICD-10-CM

## 2022-07-12 DIAGNOSIS — R739 Hyperglycemia, unspecified: Secondary | ICD-10-CM

## 2022-07-12 DIAGNOSIS — Z79899 Other long term (current) drug therapy: Secondary | ICD-10-CM

## 2022-07-12 NOTE — Patient Instructions (Signed)
Please call number below to reschedule MRI brain [phone]

## 2022-07-12 NOTE — Progress Notes (Signed)
Name: Nicole Ballard   MRN: [id number]    DOB: [birth year]   Date:07/12/2022       Progress Note  Subjective  Chief Complaint  Follow Up  HPI  HTN:   Continue Atenolol 25 BID ,  Norvasc 7.5 mg and Telmisartan 80 mg , she states bp has been well controlled at home lately, 120's/70's She has chronic sob and multifactorial, she has intermittent palpitation but better with higher dose of Atenolol BID. No recent episodes of chest pain    INTERPRETATION ( done 02/2019 )  Normal Stress Echocardiogram NORMAL RIGHT VENTRICULAR SYSTOLIC FUNCTION TRIVIAL REGURGITATION NOTED  NO VALVULAR STENOSIS NOTED   History of DM: denies polyphagia, polyuria or polydipsia A1C is still under control 5.5 % back in Nov 2022. She is on metformin due to gradual weight gain, and weight is trending down    MDD/Mild Neurocognitive Impairment: under the care of psychiatrist  at [facility], she is taking Effexor 100  mg, Lamictal, Wellbutrin 300 mg now, and Klonopin qhs. She asked me to switch a psychiatrist but they cannot see her until next year, she does not like her current provider, she does not feel like she care for her. She will keep her visit on Nov 14 th to get enough medications to last until her follow up at [facility].    Dyslipidemia: reviewed last labs, taking Atorvastatin and denies side effects. LDL 75, continue current regiment  We will recheck labs today    Post-inflammatory pulmonary fibrosis/RAD: under the care of pulmonologist at [facility], she has occasional cough she continues to have SOB - likely multifactorial. She is on high dose PPI and also uses Flovent    Lupus involvement lungs/Immunosuppression  she is seeing Rheumatologist , taking plaquenil but continues to have some SOB, she has a dry cough and sometimes problems breathing, she is seeing ENT and will have a procedure done tomorrow , she has auto-immune disease and also takes prednisone daily ( immunosuppressed)    Morbid obesity: her BMI  was down to 29.94 but gradually went up and and is now above 35 and she has co-morbidities such as dyslipidemia, HTN and GERD , we will try adding Metformin    GERD: she She is on PPI and pepcid prn, sates symptoms are controlled . Seen by GI Feb 2023   Vertigo: she still has intermittent symptoms takes Meclizine and also zofran from chronic nausea . She states about the same, she is still under the care of Dr. Darnelle Going at [facility].   Neurogenic claudication: symptoms are stable, aching when she walks , now she is off gabapentin and taking lyrica , pain does not seem controlled.   Chronic nausea: taking zofran prn and controls symptoms, she has a gastroenterologist    Empty Sella: last MRI was 2021  no longer showed pituitary adenoma. Seen by neurosurgeon in the past but now released She is having headaches, history of craniotomy, we ordered MRI but she missed appointment , she will contact them again to re-schedule it   Chronic constipation: she is skipping doses of Linzess, she states daily causes diarrhea, but if she does not take it can go a whole week without bowel movements, advised to try every other day, she is afraid to take it when out of the house   Chronic pain from/Neurogenic claudication Lupus and FMS: going to pain clinic, she states her back pain is intense and sometimes, she also has knee pain, muscle aches. She is taking Lyrica  twice daily, pain level is still high, medication does not seem to help with symptoms.   Eczema: she has medication given by Dermatologist, no rashes. Unchanged    Patient Active Problem List   Diagnosis Date Noted   Immunocompromised ([place]) 04/29/2021   Hyperglycemia 10/23/2017   Vertigo 09/29/2016   Bronchiolitis obliterans organizing pneumonia ([place]) 09/29/2016   Neck pain, musculoskeletal 12/15/2015   Abnormal CAT scan 04/11/2015   Auditory impairment 04/11/2015   Insomnia, persistent 04/11/2015   Chronic kidney disease (CKD), stage I 04/11/2015    Chronic nonmalignant pain 04/11/2015   Chronic constipation 04/11/2015   Dyslipidemia 04/11/2015   Fibromyalgia 04/11/2015   Gastro-esophageal reflux disease without esophagitis 04/11/2015   Dysphonia 04/11/2015   Low back pain 04/11/2015   Eczema intertrigo 04/11/2015   Keratosis pilaris 04/11/2015   Chronic recurrent major depressive disorder ([place]) 16/06/9603   Dysmetabolic syndrome 54/05/8118   Neurogenic claudication 04/11/2015   Perennial allergic rhinitis with seasonal variation 04/11/2015   Abnormal presence of protein in urine 04/11/2015   Seborrhea capitis 04/11/2015   Moderate dysplasia of vulva 04/11/2015   Vitamin D deficiency 04/11/2015   Spinal stenosis of lumbar region 04/11/2015   Treadmill stress test negative for angina pectoris 03/05/2015   Shortness of breath 05/20/2014   Asthma, chronic 12/31/2013   H/O total knee replacement 12/31/2013   Episcleritis 09/26/2013   Vocal cord dysfunction 05/28/2013   Anxiety, generalized 03/19/2013   Back pain 12/18/2012   Pulmonary nodules 11/26/2012   Lung nodule, multiple 11/26/2012   Arthritis, degenerative 11/01/2012   H/O aspiration pneumonitis 10/22/2012   Postinflammatory pulmonary fibrosis ([place]) 10/22/2012   Mixed incontinence 05/04/2012   Lung involvement in systemic lupus erythematosus ([place]) 11/05/2010   Chronic nausea 07/01/2008   Benign hypertension 01/25/2005    Past Surgical History:  Procedure Laterality Date   BRAIN SURGERY     CHOLECYSTECTOMY     VAGINAL HYSTERECTOMY      Family History  Problem Relation Age of Onset   Breast cancer Mother    Cancer Mother 26       Breast   Cancer Father 17       Stomach   Breast cancer Maternal Aunt     Social History   Tobacco Use   Smoking status: Never   Smokeless tobacco: Never  Substance Use Topics   Alcohol use: No    Alcohol/week: 0.0 standard drinks of alcohol     Current Outpatient Medications:    albuterol (VENTOLIN HFA) 108 (90 Base)  MCG/ACT inhaler, SMARTSIG:2 Puff(s) By Mouth Every 6 Hours PRN, Disp: , Rfl:    amLODipine (NORVASC) 2.5 MG tablet, TAKE 3 TABLETS BY MOUTH DAILY, Disp: 270 tablet, Rfl: 3   aspirin EC 81 MG tablet, Take 1 tablet (81 mg total) by mouth daily., Disp: 30 tablet, Rfl: 0   atenolol (TENORMIN) 25 MG tablet, Take 1 tablet by mouth in the morning and at bedtime., Disp: , Rfl:    atorvastatin (LIPITOR) 40 MG tablet, TAKE 1 TABLET BY MOUTH  DAILY, Disp: 90 tablet, Rfl: 3   Biotin 1 MG CAPS, Take 1 capsule by mouth daily., Disp: 30 capsule, Rfl: 0   buPROPion (WELLBUTRIN XL) 300 MG 24 hr tablet, Take 300 mg by mouth in the morning., Disp: , Rfl:    Cholecalciferol (VITAMIN D) 2000 UNITS CAPS, Take 1 capsule by mouth daily., Disp: , Rfl:    clobetasol ointment (TEMOVATE) 1.47 %, Apply 1 application topically 2 (two) times daily., Disp: ,  Rfl:    clonazePAM (KLONOPIN) 0.5 MG tablet, Take 0.25 mg by mouth at bedtime., Disp: , Rfl:    clotrimazole-betamethasone (LOTRISONE) cream, Apply 1 application topically 2 (two) times daily., Disp: 45 g, Rfl: 0   diclofenac Sodium (VOLTAREN) 1 % GEL, Apply topically daily as needed., Disp: , Rfl:    fluticasone (FLONASE) 50 MCG/ACT nasal spray, Place 2 sprays into both nostrils daily., Disp: 48 g, Rfl: 3   gabapentin (NEURONTIN) 300 MG capsule, Take 300 mg (1 capsule) in the morning and at 300 mg (1 capsule) at noon and 600 mg (2 capsules) at night., Disp: , Rfl:    hydroxychloroquine (PLAQUENIL) 200 MG tablet, Take by mouth 2 (two) times daily., Disp: , Rfl:    hydrOXYzine (ATARAX) 10 MG tablet, Take 1 tablet (10 mg total) by mouth 3 (three) times daily as needed., Disp: 30 tablet, Rfl: 0   lamoTRIgine (LAMICTAL) 200 MG tablet, Take 400 mg by mouth at bedtime., Disp: , Rfl:    LINZESS 145 MCG CAPS capsule, Take 145 mcg by mouth daily., Disp: , Rfl:    loratadine (CLARITIN) 10 MG tablet, Take 1 tablet (10 mg total) by mouth daily., Disp: 90 tablet, Rfl: 1   Melatonin 10 MG  TABS, Take 1 tablet by mouth at bedtime., Disp: , Rfl:    metFORMIN (GLUCOPHAGE-XR) 500 MG 24 hr tablet, TAKE 1 TABLET(500 MG) BY MOUTH DAILY WITH BREAKFAST, Disp: 90 tablet, Rfl: 1   metoprolol succinate (TOPROL-XL) 25 MG 24 hr tablet, Take 1 tablet (25 mg total) by mouth daily. In place of Atenolol, Disp: 30 tablet, Rfl: 0   mycophenolate (MYFORTIC) 360 MG TBEC EC tablet, Take 720 mg by mouth 2 (two) times daily., Disp: , Rfl:    olopatadine (PATANOL) 0.1 % ophthalmic solution, Place 1 drop into both eyes 2 (two) times daily., Disp: 5 mL, Rfl: 1   omeprazole (PRILOSEC) 40 MG capsule, Take 1 capsule by mouth daily at 12 noon., Disp: , Rfl:    ondansetron (ZOFRAN) 4 MG tablet, Take 1 tablet (4 mg total) by mouth every 8 (eight) hours as needed for nausea or vomiting., Disp: 20 tablet, Rfl: 0   Polyethylene Glycol 3350 (MIRALAX PO), Take by mouth., Disp: , Rfl:    predniSONE (DELTASONE) 2.5 MG tablet, Take 1 tablet by mouth daily at 12 noon., Disp: , Rfl:    pregabalin (LYRICA) 50 MG capsule, Take 50 mg by mouth 3 (three) times daily., Disp: , Rfl:    telmisartan (MICARDIS) 80 MG tablet, TAKE 1 TABLET BY MOUTH DAILY, Disp: 90 tablet, Rfl: 3   traZODone (DESYREL) 100 MG tablet, Take 150 mg by mouth at bedtime., Disp: , Rfl:    triamcinolone ointment (KENALOG) 0.1 %, , Disp: , Rfl:    venlafaxine (EFFEXOR) 100 MG tablet, Take 100 mg by mouth every evening., Disp: , Rfl:    famotidine (PEPCID) 20 MG tablet, Take by mouth., Disp: , Rfl:   Allergies  Allergen Reactions   Ace Inhibitors     Other reaction(s): OTHER Pt states she can not take ace inhibitors. Pt states she can not remember her reaction.    Bee Pollen    Penicillins    Pollen Extract     I personally reviewed active problem list, medication list, allergies, family history, social history, health maintenance with the patient/caregiver today.   ROS  Constitutional: Negative for fever or weight change.  Respiratory: positive for  cough and shortness of breath.   Cardiovascular:  Negative for chest pain or palpitations.  Gastrointestinal: positive  for  intermittent abdominal pain, no bowel changes.  Musculoskeletal: positive  for gait problem and intermittent joint swelling.  Skin: Negative for rash.  Neurological: Negative for dizziness , positive for intermittent  headache.  No other specific complaints in a complete review of systems (except as listed in HPI above).   Objective  Vitals:   07/12/22 1326  BP: 126/70  Pulse: 75  Resp: 18  Temp: 98.2 F (36.8 C)  TempSrc: Oral  SpO2: 94%  Weight: 233 lb 9.6 oz (106 kg)  Height: '5\' 7"'$  (1.702 m)    Body mass index is 36.59 kg/m.  Physical Exam  Constitutional: Patient appears well-developed and well-nourished. Obese  No distress.  HEENT: head atraumatic, normocephalic, pupils equal and reactive to light, neck supple Cardiovascular: Normal rate, regular rhythm and normal heart sounds.  No murmur heard. No BLE edema. Pulmonary/Chest: Effort normal and breath sounds normal. No respiratory distress. Abdominal: Soft.  There is no tenderness. Psychiatric: Patient has a normal mood and affect. behavior is normal. Judgment and thought content normal.   Recent Results (from the past 2160 hour(s))  CBC     Status: None   Collection Time: 04/13/22 10:28 PM  Result Value Ref Range   WBC 10.4 4.0 - 10.5 K/uL   RBC 5.00 3.87 - 5.11 MIL/uL   Hemoglobin 13.6 12.0 - 15.0 g/dL   HCT 44.0 36.0 - 46.0 %   MCV 88.0 80.0 - 100.0 fL   MCH 27.2 26.0 - 34.0 pg   MCHC 30.9 30.0 - 36.0 g/dL   RDW 14.1 11.5 - 15.5 %   Platelets 363 150 - 400 K/uL   nRBC 0.0 0.0 - 0.2 %    Comment: Performed at [facility], [address] [place], [place] [address]  Basic metabolic panel     Status: Abnormal   Collection Time: 04/13/22 10:28 PM  Result Value Ref Range   Sodium 141 135 - 145 mmol/L   Potassium 3.8 3.5 - 5.1 mmol/L   Chloride 104 98 - 111 mmol/L   CO2 29 22 -  32 mmol/L   Glucose, Bld 118 (H) 70 - 99 mg/dL    Comment: Glucose reference range applies only to samples taken after fasting for at least 8 hours.   BUN 13 8 - 23 mg/dL   Creatinine, Ser 1.08 (H) 0.44 - 1.00 mg/dL   Calcium 9.2 8.9 - 10.3 mg/dL   GFR, Estimated 57 (L) >60 mL/min    Comment: (NOTE) Calculated using the CKD-EPI Creatinine Equation (2021)    Anion gap 8 5 - 15    Comment: Performed at [facility], [place], [place] [address]  Troponin I (High Sensitivity)     Status: None   Collection Time: 04/13/22 10:28 PM  Result Value Ref Range   Troponin I (High Sensitivity) 5 <18 ng/L    Comment: (NOTE) Elevated high sensitivity troponin I (hsTnI) values and significant  changes across serial measurements may suggest ACS but many other  chronic and acute conditions are known to elevate hsTnI results.  Refer to the "Links" section for chest pain algorithms and additional  guidance. Performed at [facility], [address] [place], [place] [address]   Troponin I (High Sensitivity)     Status: None   Collection Time: 04/14/22  1:38 AM  Result Value Ref Range   Troponin I (High Sensitivity) 5 <18 ng/L  Comment: (NOTE) Elevated high sensitivity troponin I (hsTnI) values and significant  changes across serial measurements may suggest ACS but many other  chronic and acute conditions are known to elevate hsTnI results.  Refer to the "Links" section for chest pain algorithms and additional  guidance. Performed at [facility], [address] [place], [place] [address]   D-dimer, quantitative     Status: None   Collection Time: 04/14/22  1:38 AM  Result Value Ref Range   D-Dimer, Quant 0.48 0.00 - 0.50 ug/mL-FEU    Comment: (NOTE) At the manufacturer cut-off value of 0.5 g/mL FEU, this assay has a negative predictive value of 95-100%.This assay is intended for use in conjunction with a clinical pretest probability (PTP)  assessment model to exclude pulmonary embolism (PE) and deep venous thrombosis (DVT) in outpatients suspected of PE or DVT. Results should be correlated with clinical presentation. Performed at [facility], [address] [place], [place] [address]   TSH     Status: None   Collection Time: 04/14/22  1:38 AM  Result Value Ref Range   TSH 1.920 0.350 - 4.500 uIU/mL    Comment: Performed by a 3rd Generation assay with a functional sensitivity of <=0.01 uIU/mL. Performed at [facility], [address] [place], [place] [address]   Magnesium     Status: None   Collection Time: 04/14/22  1:38 AM  Result Value Ref Range   Magnesium 2.1 1.7 - 2.4 mg/dL    Comment: Performed at [facility], [address] [place], [place] [address]  SARS Coronavirus 2 by RT PCR (hospital order, performed in [facility] hospital lab) *cepheid single result test* Anterior Nasal Swab     Status: None   Collection Time: 04/14/22  2:13 AM   Specimen: Anterior Nasal Swab  Result Value Ref Range   SARS Coronavirus 2 by RT PCR NEGATIVE NEGATIVE    Comment: (NOTE) SARS-CoV-2 target nucleic acids are NOT DETECTED.  The SARS-CoV-2 RNA is generally detectable in upper and lower respiratory specimens during the acute phase of infection. The lowest concentration of SARS-CoV-2 viral copies this assay can detect is 250 copies / mL. A negative result does not preclude SARS-CoV-2 infection and should not be used as the sole basis for treatment or other patient management decisions.  A negative result may occur with improper specimen collection / handling, submission of specimen other than nasopharyngeal swab, presence of viral mutation(s) within the areas targeted by this assay, and inadequate number of viral copies (<250 copies / mL). A negative result must be combined with clinical observations, patient history, and epidemiological information.  Fact Sheet for Patients:    https://www.patel.info/  Fact Sheet for Healthcare Providers: https://hall.com/  This test is not yet approved or  cleared by the [place] FDA and has been authorized for detection and/or diagnosis of SARS-CoV-2 by FDA under an Emergency Use Authorization (EUA).  This EUA will remain in effect (meaning this test can be used) for the duration of the COVID-19 declaration under Section 564(b)(1) of the Act, 21 U.S.C. section 360bbb-3(b)(1), unless the authorization is terminated or revoked sooner.  Performed at [facility], [address] [place], [place] [address]     PHQ2/9:    07/12/2022    1:32 PM 06/21/2022    7:31 AM 06/06/2022   10:24 AM 04/15/2022    9:23 AM 04/12/2022    7:37 AM  Depression screen PHQ 2/9  Decreased Interest 2 1  $'1 3 1  'p$ Down, Depressed, Hopeless '2 1 3 3 1  '$ PHQ - 2 Score '4 2 4 6 2  '$ Altered sleeping '2 1 1 1 '$ 0  Tired, decreased energy '2 1 3 1 2  '$ Change in appetite '2 2 3 1 3  '$ Feeling bad or failure about yourself  '2 1 3 1 1  '$ Trouble concentrating 2 1 0 1 1  Moving slowly or fidgety/restless 1 0 0 0 1  Suicidal thoughts 0 0 0 0 0  PHQ-9 Score '15 8 14 11 10  '$ Difficult doing work/chores Somewhat difficult Somewhat difficult   Very difficult    phq 9 is positive   Fall Risk:    07/12/2022    1:28 PM 06/06/2022   10:24 AM 04/15/2022    9:19 AM 02/02/2022    2:12 PM 12/20/2021    7:46 AM  Fall Risk   Falls in the past year? 0 0 0 1 0  Number falls in past yr:  0 0 0 0  Injury with Fall?  0 0 0 0  Comment    walking shoe hit cement and tripped me to a fall   Risk for fall due to : No Fall Risks No Fall Risks No Fall Risks Medication side effect No Fall Risks  Follow up Falls prevention discussed;Education provided;Falls evaluation completed Falls prevention discussed Falls prevention discussed Falls prevention discussed;Education provided Falls prevention discussed      Functional Status  Survey: Is the patient deaf or have difficulty hearing?: Yes Does the patient have difficulty seeing, even when wearing glasses/contacts?: No Does the patient have difficulty concentrating, remembering, or making decisions?: No Does the patient have difficulty walking or climbing stairs?: Yes Does the patient have difficulty dressing or bathing?: No Does the patient have difficulty doing errands alone such as visiting a doctor's office or shopping?: No    Assessment & Plan  1. Chronic recurrent major depressive disorder [facility])  She is looking for another psychiatrist   2. Morbid obesity ([place])  Discussed with the patient the risk posed by an increased BMI. Discussed importance of portion control, calorie counting and at least 150 minutes of physical activity weekly. Avoid sweet beverages and drink more water. Eat at least 6 servings of fruit and vegetables daily    3. Empty sella ([place])  She is having more headaches and will schedule MRI  4. Postinflammatory pulmonary fibrosis ([facility])  Keep follow up with [facility]  5. Interstitial lung disease ([place])  Under the care of rheumatologist and pulmonologist   6. Perennial allergic rhinitis with seasonal variation   7. Neurogenic claudication   8. History of craniotomy   9. Benign hypertension  At goal   10. Fibromyalgia   11. Cough in adult   12. Chronic nausea   13. Long-term use of high-risk medication  - Vitamin B12  14. Hyperglycemia  - Hemoglobin A1c  15. Dyslipidemia  - Lipid panel   16. Immunocompromised ([place])

## 2022-07-13 ENCOUNTER — Ambulatory Visit: Admit: 2022-07-13 | Discharge: 2022-07-13 | Payer: MEDICARE

## 2022-07-13 ENCOUNTER — Ambulatory Visit: Admit: 2022-07-13 | Discharge: 2022-07-13 | Payer: MEDICAID | Attending: Otolaryngology | Primary: Otolaryngology

## 2022-07-13 DIAGNOSIS — R942 Abnormal results of pulmonary function studies: Principal | ICD-10-CM

## 2022-07-13 DIAGNOSIS — Z5181 Encounter for therapeutic drug level monitoring: Principal | ICD-10-CM

## 2022-07-13 DIAGNOSIS — J8489 Other specified interstitial pulmonary diseases: Principal | ICD-10-CM

## 2022-07-13 DIAGNOSIS — J986 Disorders of diaphragm: Principal | ICD-10-CM

## 2022-07-13 DIAGNOSIS — K219 Gastro-esophageal reflux disease without esophagitis: Principal | ICD-10-CM

## 2022-07-13 LAB — VITAMIN B12: Vitamin B-12: 466 pg/mL (ref 200–1100)

## 2022-07-13 LAB — LIPID PANEL
Cholesterol: 140 mg/dL (ref <200)
HDL: 71 mg/dL (ref >=50)
LDL Cholesterol (Calc): 56 mg/dL (calc)
Non-HDL Cholesterol (Calc): 69 mg/dL (calc) (ref <130)
Total CHOL/HDL Ratio: 2 (calc) (ref <5.0)
Triglycerides: 44 mg/dL (ref <150)

## 2022-07-13 LAB — HEMOGLOBIN A1C
Hgb A1c MFr Bld: 6.3 % of total Hgb — ABNORMAL HIGH (ref <5.7)
Mean Plasma Glucose: 134 mg/dL
eAG (mmol/L): 7.4 mmol/L

## 2022-07-13 MED ORDER — ALBUTEROL SULFATE HFA 90 MCG/ACTUATION AEROSOL INHALER
Freq: Four times a day (QID) | RESPIRATORY_TRACT | 11 refills | 0 days | Status: CP | PRN
Start: 2022-07-13 — End: 2023-07-13

## 2022-07-13 NOTE — Unmapped (Unsigned)
Office Procedure: Percutaneous right superior laryngeal nerve block      07/13/2022    Surgeon: Suzan Nailer, M.D.    Assistant:     Pre-op Diagnosis:   1.        Post-Op Diagnosis:   1.       Clinical notes:   66 y.o.  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   ??  At the end of our previous clinic visit we discussed and recommended clinical course of Respiratory Retraining, Biotene mouthwash, D/C Listerine- switch to salt water gargles   ??  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  ??  December 2018:??The patient notes that her symptoms have been relatively stable. ??She continues to complain of burning tongue.????Following this visit I recommended a thyroid hormone panel and thyroid ultrasound.  ??  August 2019:??The patient reports that she continues to clear there throat and cough in a consistent manner. ??Thyroid ultrasound was unremarkable w/o nodules. She occasionally (rarely ) awakens from sleep with cough. She has done a few sessions with SLP, but claims to have forgotten the techniques. 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 ??I additionally offered behavioral intervention for her cough symptom.  ??  April 2021: In the interim the patient is followed up with neurosurgery and has had radiography demonstrating no additional lesions. ??The patient did however complain of right-sided head throbbing, and otalgia.????Ms. Koran??notes that she has continued to have balance difficulty, intermittent cough and dysphonia. ??Lastly, she reports that her throat has been burning more than in the past though she has not seen her GI physician for some time. ??The patient reports that she has had ongoing right otalgia that she is treated with hydrogen peroxide. ??She has had her ears flushed by her primary care physician without resolution. ??The patient was referred to otology for right otalgia.  ??  June 2022: Patient returns with concerns for weak vocal projection, worsened over the past several weeks. She attributes this to her LPR, but has been taking her daily Prilosec with minimal relief. Otherwise, she has been doing well, without any additional concerns. Given the stability of the patient's physical examination and findings referable to upper airway inflammation, I have a recommended that we proceed with management of her known LPR. Add Famotidine 20 mg BID to supplement Prilosec 40 mg  ??  October 2022: The patient returns for follow-up.??She states that her voice continues to be hoarse. The patient endorses seasonal allergies. She states that she follows up with pulmonology and was started on albuterol inhaler. She endorses coughing symptoms that has been a past ongoing issue.??The patient??states that??she has the sensation of food getting??caught in her epigastric region. She endorses abdominal pain, right under the ribcage, for the past 4-5 months and has not seen GI.??She describes it as a dull pain that will last for a few minutes before going away. She states that the abdominal pain will occur randomly at rest and has??generally not been benefited by??reflux medication. She endorses difficulty with bowel movement, which has been an ongoing issue in the past.I reassured the patient that her larynx appears healthy on laryngoscopy today. I recommend NeilMed nasal saline irrigation  for general nasopharyngeal hygiene. I will refer the patient to GI for further evaluation of gastric discomfort and constipation.  I offered a referral to GI for gastric discomfort and constipation, and recommended NeilMed nasal irrigations.  ??  October 2023: The patient coughs consistently  with pain in the right ear.  The cough comes throughout the day without clear inciting behaviors.  She needs water to calm it down.  The patient reports having tried reflux medication, particularly due to the fact that she also reports some burning in the throat, however this was unhelpful for her cough symptom.  The patient also reports achiness in the right ear (the same side as her previous skull base retroauricular approach).  ??  We discussed interventions for chronic cough (putative neurogenic cough).  These include neuromodulator medicines (patient currently on gabapentin and less interested in beginning another chronic medication), as well as interventions such as SLN injection, and intra laryngeal Botox.  We have decided to proceed to trial of SLN injection on the right, given the prevalence of her symptoms on the right (ear, vocal fold hypomobility)    November 2023:   ??  Anesthesia:  None     Procedures:     Percutaneous superior laryngeal nerve block (CPT: [phone])     The location of the *** SLN was identified anatomically. A 25 gauge needle was advanced through the overlying skin and the resistance of the *** thyrohyoid membrane was palpated.  Approximately 1.0 ml of Kenalog 40 mixed 1:1 with 1% lidocaine with 1 100,000 of epinephrine was  infiltrated in to the surrounding tissue . Thereafter the needle was withdrawn and the site observed for evidence of bleeding  None was detected      The patient tolerated the procedure well     At the conclusion of the procedure the patient was observed and recovered in the procedure room and was dicharged in stable condition.e.  No respiratory distress nor complaints were noted.      Findings:   1.Normal anatomy   At the conclusion of the procedure the patient was observed and recovered in the procedure room and was dicharged in stable condition..  No respiratory distress nor complaints were noted.      Estimated Blood Loss (in mL): <58ml    Complications: None     Disposition:***    Attending Notes:     I performed the entire procedure    Lawrence Marseilles  Answers for HPI/ROS submitted by the patient on 07/06/2022  Chronicity: recurrent  chest pain: No  chills: No

## 2022-07-13 NOTE — Unmapped (Signed)
Spoke with pt, see phone encounter, forwarded to MD

## 2022-07-13 NOTE — Unmapped (Signed)
Reason for call: PT is requesting for a urgent call back. Wants to check with provider that it's okay to get injections in her neck?     Thanks       Last ov: 02/08/2022  Next ov: 07/14/2022

## 2022-07-13 NOTE — Unmapped (Signed)
Spoke with pt about right SLN injection for neurogenic cough. Pt is asking if this will have any effect on her SLE.    MD notified

## 2022-07-13 NOTE — Unmapped (Signed)
Hi [name],    It was great meeting you today. Here is what we talked about.    1) Immune Related Lung Disease  - You're doing well from this perspective  - Let's get a 6 minute walk test at [facility] to make sure you don' tneed oxygen - [phone]  - Repeat breathing tests at next visit in about 3 months   - At next visit think about lowering prednisone again to 5mg  daily. Then potentially off in the future if you're doing well.     2) Chronic Cough  - I'm not entirely sure what's driving this  - I don't think it's primarily your lungs  - Dr. Vergie Living will do his thing - let's see how that does  - For reflux - continue omeprazole (prilosec) 40mg  twice daily and famotidine (pepcid) twice daily. Work on eating at least a couple hours before bed. Think about sleeping with head of bed elevated to at least 15 degrees to help prevent reflux.   - If sinuses are doing well no changes there, I'll let Dr. Vergie Living change things as he sees fit.     Please return to clinic in 3 months.     If you have any non-urgent questions or concerns please reach out by calling the clinic or via MyChart.    Thanks,  Layla Maw MD  Pulmonary and Critical Care Fellow

## 2022-07-14 ENCOUNTER — Ambulatory Visit: Admit: 2022-07-14 | Discharge: 2022-07-15 | Payer: MEDICARE

## 2022-07-14 DIAGNOSIS — M3219 Other organ or system involvement in systemic lupus erythematosus: Principal | ICD-10-CM

## 2022-07-14 DIAGNOSIS — Z79899 Other long term (current) drug therapy: Principal | ICD-10-CM

## 2022-07-14 DIAGNOSIS — M329 Systemic lupus erythematosus, unspecified: Principal | ICD-10-CM

## 2022-07-14 LAB — C3 COMPLEMENT: C3 COMPLEMENT: 114 mg/dL (ref 90–170)

## 2022-07-14 LAB — URINALYSIS WITH MICROSCOPY WITH CULTURE REFLEX
BILIRUBIN UA: NEGATIVE
BLOOD UA: NEGATIVE
GLUCOSE UA: NEGATIVE
KETONES UA: NEGATIVE
LEUKOCYTE ESTERASE UA: NEGATIVE
NITRITE UA: NEGATIVE
PH UA: 6 (ref 5.0–9.0)
RBC UA: 0 /HPF (ref 0–3)
SPECIFIC GRAVITY UA: >=1.03 (ref 1.005–1.030)
SQUAMOUS EPITHELIAL: 0 /HPF (ref 0–5)
UROBILINOGEN UA: 2
WBC UA: <1 /HPF (ref 0–3)

## 2022-07-14 LAB — PROTEIN / CREATININE RATIO, URINE
CREATININE, URINE: 219.5 mg/dL
PROTEIN URINE: 37.1 mg/dL
PROTEIN/CREAT RATIO, URINE: 0.169

## 2022-07-14 LAB — C4 COMPLEMENT: C4 COMPLEMENT: 26.6 mg/dL (ref 12.0–36.0)

## 2022-07-14 MED ORDER — MYCOPHENOLATE SODIUM 360 MG TABLET,DELAYED RELEASE
ORAL_TABLET | Freq: Two times a day (BID) | ORAL | 3 refills | 90 days | Status: CP
Start: 2022-07-14 — End: ?
  Filled 2022-09-08: qty 360, 90d supply, fill #0

## 2022-07-14 NOTE — Unmapped (Signed)
Patient Name: [name]  PCP: ??Sowles, [name], MD  Source of History: patient and records  Date of Visit: 07/14/22 10:02 AM    Chief Compliant: follow-up for SLE and FMS    PRIOR RHEUMATOLOGIC HISTORY:??SLE, OA, and fibromyalgia.  Initially diagnosed in 2006 with SLE presenting with episcleritis and arthralgia with serology showing +ANA, +dsDNA.  She also has stage I CKD felt secondary not due to SLE but due to HTN and obesity. In 04/2016 she was dx with eosinophilic pneumonitis, later evaluation most consistent with organizing pneumonia, following with pulmonology.  Treatment history:  - HCQ  - Previously treated with prednisone for eosinophilic pneumonitis with difficulty tapering due to pulm symptoms, so cellcept was added 2018.   - Cellcept changed to myfortic 2019 due to GI distress. Myfortic increased from 360 mg po bid to 720 mg po bid in January 2023.  - tapered off prednisone in late 2019  - Derm eval in 07/2018 consistent with inverse psoriasis, possibly due to prednisone use. Recommended avoiding systemic steroids when possible. - Myfortic increased to 720 mg BID in 09/2021 due to worsening ILD.   - Prednisone (to be taken indefinitely) resumed by pulmonology in 12/2021. Tapered from 10 mg to 7.5 mg daily by Pulm in August 2023. November 2023 prednisone tapered to 5 mg daily.   - Current med regimen: Myfortic 720 mg twice daily, Plaquenil 200 mg twice daily, Prednisone 5 mg qd (per pulm).  Also followed by pain clinic for pharmacotherapy for fibromyalgia.    HPI: KYREN ORAND is a 66 y.o. female who presents for her follow-up for SLE complicated by eosinophilic pneumonitis vs organizing pneumonia and Inverse psoriasis managed topically as well as underlying fibromyalgia. She was last evaluated by me in January 2023 when Myfortic was increased to 720 mg po bid due to concerns of worsening PFT. Saw Carlus Pavlov [facility] in June 2023 tolerating increase but having heartburn. She presents to clinic today in person.    She reached out yesterday about getting injections with ENT for her coughing. Discussed no concerns from a lupus perspective. She saw pulmonary yesterday and prednisone is now being tapered from 7.5 mg to 5 mg daily. She will have PFT in 2-3 months. She is taking omeprazole 40 mg twice a day for her reflux. Her ENT has recommended she also take famotidine. She continues on Myfortic 720 mg po bid and hydroxychloroquine 200  mg po bid. Last eye exam was in June 2023 with no concerns.  She reports pain in shoulders and knees. She is hoping to get injections with pain management to address her pain. No active rash but has pruritus currently.     ROS??:  Attests to the above, otherwise, review of all other systems is negative.   ??  Past Medical and Surgical History:  ??  Patient Active Problem List    Diagnosis Date Noted   ??? Immunocompromised (CMS-HCC) 04/29/2021   ??? Otalgia 12/03/2019   ??? Hyperglycemia 10/23/2017   ??? Bronchiolitis obliterans organizing pneumonia (CMS-HCC) 09/29/2016   ??? Vertigo 09/29/2016   ??? Neck pain 12/15/2015   ??? Fibromyalgia 10/12/2015   ??? Chronic constipation 04/11/2015   ??? Chronic recurrent major depressive disorder (CMS-HCC) 04/11/2015   ??? Dyslipidemia 04/11/2015   ??? Obesity, diabetes, and hypertension syndrome (CMS-HCC) 04/11/2015   ??? Intertrigo 04/11/2015   ??? Fibrositis 04/11/2015   ??? Gastroesophageal reflux disease without esophagitis 04/11/2015   ??? Vitamin D deficiency 04/11/2015   ??? Seborrhea  capitis 04/11/2015   ??? Perennial allergic rhinitis with seasonal variation 04/11/2015   ??? Moderate dysplasia of vulva 04/11/2015   ??? Spinal stenosis of lumbar region 04/11/2015   ??? Keratosis pilaris 04/11/2015   ??? Insomnia, persistent 04/11/2015   ??? Auditory impairment 04/11/2015   ??? Eczema intertrigo 04/11/2015   ??? Neurogenic claudication 04/11/2015   ??? Screening for cardiovascular condition 03/05/2015   ??? Encounter for screening for cardiovascular disorders 03/05/2015   ??? Treadmill stress test negative for angina pectoris 03/05/2015   ??? Cough 05/20/2014   ??? Shortness of breath 05/20/2014   ??? Regurgitation 04/24/2014   ??? Epigastric burning sensation 04/24/2014   ??? Obesity with body mass index 30 or greater 02/03/2014   ??? S/P total knee replacement 12/31/2013   ??? Asthma 12/31/2013   ??? Asthma, chronic 12/31/2013   ??? H/O total knee replacement 12/31/2013   ??? SLE (systemic lupus erythematosus) (CMS-HCC) 11/28/2013   ??? Episcleritis 09/26/2013   ??? Other diseases of vocal cords 05/28/2013   ??? Vocal cord dysfunction 05/28/2013   ??? Generalized anxiety disorder 03/19/2013   ??? Pain medication agreement signed 02/12/2013   ??? Family history of breast cancer    ??? 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 Back pain 12/18/2012   ??? Multiple pulmonary nodules 11/26/2012   ??? Lung nodule, multiple 11/26/2012   ??? Pulmonary nodules 11/26/2012   ??? Osteoarthrosis 11/01/2012   ??? Osteoarthritis 11/01/2012   ??? Arthritis, degenerative 11/01/2012   ??? Postinflammatory pulmonary fibrosis (CMS-HCC) 10/22/2012   ??? Aspiration pneumonitis (CMS-HCC) 10/22/2012   ??? H/O aspiration pneumonitis 10/22/2012   ??? Mixed urge and stress incontinence 05/04/2012   ??? Dysphonia 03/22/2012   ??? Sensorineural hearing loss 03/22/2012   ??? Myalgia and myositis 02/25/2011   ??? Benign neoplasm of pituitary gland and craniopharyngeal duct (pouch) (CMS-HCC) 12/29/2010   ??? Chronic kidney disease, stage I 12/08/2010   ??? Proteinuria 12/08/2010   ??? Lung involvement in systemic lupus erythematosus (CMS-HCC) 11/05/2010   ??? Chronic nausea 07/01/2008   ??? Hypertension, benign 01/25/2005     Past Surgical History:   Procedure Laterality Date   ??? BRAIN SURGERY      for facial spasms   ??? BREAST BIOPSY Right 2012    Needle bx   ??? GALLBLADDER SURGERY     ??? HYSTERECTOMY N/A 07/09/2001    Vaginal Hysterectomy with ovaries in place   ??? JOINT REPLACEMENT Right     3 TO 4 YEARS AGO   ??? PR ANAL PRESSURE RECORD N/A 03/22/2016    Procedure: ANORECTAL MANOMETRY;  Surgeon: Nurse-Based Giproc;  [facility]: [facility];  Service: Gastroenterology   ??? PR BRONCHOSCOPY,DIAGNOSTIC W LAVAGE Bilateral 04/25/2016    Procedure: BRONCHOSCOPY, RIGID OR FLEXIBLE, INCLUDE FLUOROSCOPIC GUIDANCE WHEN PERFORMED; W/BRONCHIAL ALVEOLAR LAVAGE WITH MODERATE SEDATION;  Surgeon: Mercy Moore, MD;  Location: BRONCH PROCEDURE LAB [facility];  Service: Pulmonary   ??? PR BRONCHOSCOPY,TRANSBRONCH BIOPSY N/A 04/25/2016    Procedure: BRONCHOSCOPY, RIGID/FLEXIBLE, INCLUDE FLUORO GUIDANCE WHEN PERFORMED; W/TRANSBRONCHIAL LUNG BX, SINGLE LOBE WITH MODERATE SEDATION;  Surgeon: Mercy Moore, MD;  Location: BRONCH PROCEDURE LAB [facility];  Service: Pulmonary   ??? PR COLON CA SCRN NOT HI RSK IND  08/22/2013    Procedure: COLOREC CNCR SCR;COLNSCPY NO;  Surgeon: Brown Human, MD;  Location: [facility];  Service: Gastroenterology   ??? PR COLONOSCOPY FLX DX W/COLLJ SPEC WHEN PFRMD N/A 10/12/2021    Procedure: COLONOSCOPY, FLEXIBLE,  PROXIMAL TO SPLENIC FLEXURE; DIAGNOSTIC, W/WO COLLECTION SPECIMEN BY BRUSH OR WASH;  Surgeon: Luanne Bras, MD;  Location: [facility];  Service: Gastroenterology   ??? PR COLSC FLX W/RMVL OF TUMOR POLYP LESION SNARE TQ N/A 10/12/2021    Procedure: COLONOSCOPY FLEX; W/REMOV TUMOR/LES BY SNARE;  Surgeon: Luanne Bras, MD;  Location: [facility] PROCEDURES [facility];  Service: Gastroenterology   ??? PR GERD TST W/ MUCOS IMPEDE ELECTROD,>1HR N/A 05/26/2014    Procedure: ESOPHAGEAL FUNCTION TEST, GASTROESOPHAGEAL REFLUX TEST W/ NASAL CATHETER INTRALUMINAL IMPEDANCE ELECTRODE(S) PLACEMENT, RECORDING, ANALYSIS AND INTERPRETATION; PROLONGED;  Surgeon: Nurse-Based Giproc;  [facility];  Service: Gastroenterology   ??? PR TOTAL KNEE ARTHROPLASTY Right 12/31/2013    Procedure: ARTHROPLASTY, KNEE, CONDYLE & PLATEAU; MEDIAL & LAT COMPARTMENT W/WO PATELLA RESURFACE (TOTAL KNEE ARTHROP);  Surgeon: Aram Beecham, MD;  Location: MAIN OR [facility];  Service: Orthopedics   ??? PR UPPER GI ENDOSCOPY,BIOPSY N/A 11/07/2014    Procedure: UGI ENDOSCOPY; WITH BIOPSY, SINGLE OR MULTIPLE;  Surgeon: Trula Slade, MD;  Location: [facility]ED[facility];  Service: Gastroenterology   ??? SKIN BIOPSY     ??? wide local excision of vulva Right      Allergies:   ??  Allergies   Allergen Reactions   ??? Vilazodone Swelling   ??? Ace Inhibitors Other (See Comments)     Pt states she can not take ace inhibitors. Pt states she can not remember her reaction.    ??? Bee Pollen Itching     COUGH, WATERY EYES   ??? Penicillin G Rash   ??? Penicillins Rash   ??? Pollen Extracts Itching     COUGHING, WATERY EYES     Current Outpatient Medications:  ??  Current Outpatient Medications on File Prior to Visit   Medication Sig Dispense Refill   ??? albuterol HFA 90 mcg/actuation inhaler Inhale 2 puffs every six (6) hours as needed for wheezing. 8 g 11   ??? amLODIPine (NORVASC) 2.5 MG tablet Take 3 tablets (7.5 mg total) by mouth daily.     ??? aspirin (ECOTRIN) 81 MG tablet Take 1 tablet (81 mg total) by mouth daily.     ??? atenoloL (TENORMIN) 25 MG tablet Take 1 tablet (25 mg total) by mouth Two (2) times a day. 180 tablet 1   ??? atorvastatin (LIPITOR) 40 MG tablet Take 1 tablet (40 mg total) by mouth daily.     ??? biotin 1 mg cap Take by mouth.     ??? brompheniramine-pseudoephedrine-DM 2-30-10 mg/5 mL syrup      ??? buPROPion (WELLBUTRIN XL) 300 MG 24 hr tablet Take 1 tablet (300 mg total) by mouth daily. (Patient taking differently: Take 1 tablet (300 mg total) by mouth every morning.) 90 tablet 1   ??? clobetasoL (TEMOVATE) 0.05 % ointment Apply topically Two (2) times a week. 30 g 3   ??? clonazePAM (KLONOPIN) 0.5 MG tablet TAKE 1 TABLET (0.5 MG TOTAL) BY MOUTH ONCE AS NEEDED FOR ANXIETY OR SLEEP FOR UP TO 15 DOSES. 15 tablet 0   ??? clotrimazole-betamethasone (LOTRISONE) 1-0.05 % cream Apply 1 application. topically two (2) times a day.     ??? cyclobenzaprine (FLEXERIL) 5 MG tablet Take 1 tablet (5 mg total) by mouth nightly as needed for muscle spasms. 60 tablet 2   ??? diclofenac sodium (VOLTAREN) 1 % gel Apply 2 g topically two (2) times a day as needed for arthritis. 300 g 5   ???  dicyclomine (BENTYL) 20 mg tablet Take 1 tablet (20 mg total) by mouth daily as needed.     ??? famotidine (PEPCID) 20 MG tablet Take 1 tablet (20 mg total) by mouth Two (2) times a day. 200 tablet 2   ??? fluticasone propionate (FLONASE) 50 mcg/actuation nasal spray      ??? hydrALAZINE (APRESOLINE) 10 MG tablet      ??? hydrOXYchloroQUINE (PLAQUENIL) 200 mg tablet TAKE 1 TABLET BY MOUTH  TWICE DAILY 180 tablet 3   ??? hydrOXYzine (ATARAX) 10 MG tablet      ??? inhalational spacing device (AEROCHAMBER MV) Spcr 1 each by Miscellaneous route two (2) times a day. With Fovent 1 each 0   ??? ketoconazole (NIZORAL) 2 % cream APP EXT AA D  0   ??? lamoTRIgine (LAMICTAL) 200 MG tablet TAKE 1 TABLET BY MOUTH TWICE  DAILY 200 tablet 2   ??? lidocaine (XYLOCAINE) 5 % ointment Apply topically Two (2) times a day. 100 g 2   ??? LINZESS 145 mcg capsule TAKE 1 CAPSULE(145 MCG) BY MOUTH DAILY 90 capsule 3   ??? loratadine (CLARITIN) 10 mg tablet Take 1 tablet (10 mg total) by mouth daily.     ??? meclizine (ANTIVERT) 25 mg tablet Take 1 tablet (25 mg total) by mouth Three (3) times a day as needed.     ??? melatonin 10 mg Tab Take 1 tablet by mouth.     ??? metoPROLOL succinate (TOPROL-XL) 25 MG 24 hr tablet Take 1 tablet (25 mg total) by mouth.     ??? mupirocin (BACTROBAN) 2 % ointment APP EXT IEN BID  0   ??? mycophenolate (MYFORTIC) 360 MG TbEC Take 2 tablets (720 mg total) by mouth two (2) times a day. 360 tablet 3   ??? olopatadine (PATANOL) 0.1 % ophthalmic solution Apply 1 drop to eye daily.     ??? omeprazole (PRILOSEC) 40 MG capsule Take 1 capsule (40 mg total) by mouth two (2) times a day. 180 capsule 3   ??? ondansetron (ZOFRAN) 4 MG tablet      ??? polyethylene glycol (GLYCOLAX) 17 gram/dose powder      ??? predniSONE (DELTASONE) 2.5 MG tablet Take 3 tablets (7.5 mg total) by mouth daily. 270 tablet 3   ??? pregabalin (LYRICA) 50 MG capsule Take 1 capsule (50 mg total) by mouth Three (3) times a day. 90 capsule 1   ??? telmisartan (MICARDIS) 80 MG tablet Take 1 tablet (80 mg total) by mouth.     ??? traZODone (DESYREL) 100 MG tablet TAKE 1 AND 1/2 TABLETS BY  MOUTH AT NIGHT (Patient taking differently: Take 1 tablet (100 mg total) by mouth nightly.) 135 tablet 3   ??? triamcinolone (KENALOG) 0.1 % ointment Apply twice a day to affected areas when red and itchy. Stop when improved and use a barrier cream. 80 g 1   ??? venlafaxine (EFFEXOR) 100 MG tablet TAKE 1 TABLET BY MOUTH IN THE  MORNING 30 tablet 11   ??? benzonatate (TESSALON PERLES) 100 MG capsule Take 1 capsule (100 mg total) by mouth every six (6) hours as needed for cough. (Patient not taking: Reported on 07/14/2022) 30 capsule 1   ??? [EXPIRED] fluticasone propionate (FLOVENT HFA) 220 mcg/actuation inhaler Inhale 1 puff Two (2) times a day. 12 g 11   ??? gabapentin (NEURONTIN) 300 MG capsule Take 1 capsule (300 mg total) by mouth Three (3) times a day. 90 capsule 2   ??? metFORMIN (GLUCOPHAGE-XR) 500  MG 24 hr tablet Take 1 tablet (500 mg total) by mouth daily before breakfast.     ??? pregabalin (LYRICA) 50 MG capsule        No current facility-administered medications on file prior to visit.     Immunization History   Administered Date(s) Administered   ??? COVID-19 VAC,BIVALENT,MODERNA(BLUE CAP) 12/28/2021   ??? COVID-19 VACCINE,MRNA(MODERNA)(PF) 11/20/2019, 12/23/2019, 08/25/2020, 12/31/2020, 04/29/2021   ??? HEPATITIS B VACCINE ADULT,IM(ENERGIX B, RECOMBIVAX) 06/26/2007, 08/12/2015, 10/13/2015, 02/10/2016   ??? Hepatitis A (Adult) 08/12/2015, 02/10/2016   ??? INFLUENZA TIV (TRI) 30MO+ W/ PRESERV (IM) 06/01/2010, 05/14/2013, 04/19/2014   ??? INFLUENZA TIV (TRI) PF (IM) 06/26/2007, 06/02/2011, 06/02/2011, 12/01/2011, 06/09/2013   ??? Influenza Vaccine Quad (IIV4 PF) 61mo+ injectable 04/16/2015, 05/26/2016, 05/17/2017, 05/14/2018, 06/12/2019, 06/15/2021, 06/06/2022   ??? Influenza Virus Vaccine, unspecified formulation 06/05/2020   ??? MMR 06/26/2007   ??? Measles / Rubella 10/05/1987   ??? PNEUMOCOCCAL POLYSACCHARIDE 23-VALENT 08/04/2016   ??? Pneumococcal Conjugate 13-Valent 08/06/2015, 05/26/2016   ??? Pneumococcal Conjugate 20-valent 09/30/2021   ??? SHINGRIX-ZOSTER VACCINE (HZV), RECOMBINANT,SUB-UNIT,ADJUVANTED IM 12/28/2021, 03/15/2022   ??? TdaP 06/26/2007, 07/14/2021     ??  PHYSICAL EXAM?  Vital signs: BP 147/83 (BP Site: L Arm, BP Position: Sitting) Comment: has not taking morning medications; asymtpomatic - Pulse 63  - Temp 36.1 ??C (97 ??F) (Temporal)  - Resp 18  - Ht 170.2 cm (5' 7)  - Wt (!) 105.7 kg (233 lb)  - BMI 36.49 kg/m?? Body mass index is 36.49 kg/m??.  Gen: Well-developed, well-nourished adult in no apparent distress. Normocephalic with no external signs of trauma. Pleasant and cooperative. AOx4.?  HEENT: PERRLA, EOMI, MMM, oropharynx in pink without ulcerations or thrush.   Lungs: Broad chest excursion with good air movement. CTAB without wheezing/rhonchi/rales. ??  CV: RRR, Normal S1/S2, No murmurs/rubs/gallops heard. ?  PV: Warm, 2+ radial and pedal pulses, no C/C/E.??  Neuro: Good comprehension/cognition. CN 2-12 intact.  Muscle strength 5/5 in all extremities. Gait normal.??  Comprehensive Musculoskeletal Examination:??  ?? Jaw, neck without limited ROM.??  ?? Shoulders, elbows, wrists, hands, fingers:  No deformity, erythema, warmth, swelling, effusion, tenderness, limited ROM.??   ?? Hips without limited ROM.??  ?? Knee, ankles, feet, toes: No deformity, erythema, warmth, swelling, effusion, tenderness, limited ROM. Knee stable to valgus/varus stress and anterior/posterior drawer sign.?? S/p R TKR  Skin: No rashes noted on exam today     LABORATORY - reviewed recent labs. Needs lupus serologies and urine studies today. Results reassuring.  Recent Results (from the past 168 hour(s))   C3 complement    Collection Time: 07/14/22 10:35 AM   Result Value Ref Range    C3 Complement 114 90 - 170 mg/dL   C4 complement    Collection Time: 07/14/22 10:35 AM   Result Value Ref Range    C4 Complement 26.6 12.0 - 36.0 mg/dL   Urinalysis with Microscopy with Culture Reflex    Collection Time: 07/14/22  2:07 PM    Specimen: Clean Catch; Urine   Result Value Ref Range    Color, UA Yellow     Clarity, UA Clear     Specific Gravity, UA >=1.030 1.005 - 1.030    pH, UA 6.0 5.0 - 9.0    Leukocyte Esterase, UA Negative Negative    Nitrite, UA Negative Negative    Protein, UA Trace (A) Negative    Glucose, UA Negative Negative    Ketones, UA Negative Negative    Urobilinogen, UA 2.0 mg/dL  0.2 - 2.0 mg/dL    Bilirubin, UA Negative Negative    Blood, UA Negative Negative    RBC, UA 0 0 - 3 /HPF    WBC, UA <1 0 - 3 /HPF    Squam Epithel, UA 0 0 - 5 /HPF    Bacteria, UA Rare (A) None Seen /HPF   Protein/Creatinine Ratio, Urine    Collection Time: 07/14/22  2:07 PM   Result Value Ref Range    Creat U 219.5 Undefined mg/dL    Protein, Ur 16.1 Undefined mg/dL    Protein/Creatinine Ratio, Urine 0.169 Undefined         ??GENERAL SUMMARY AND IMPRESSION: ??  ??  In summary, the patient is a 66 y.o. female with SLE complicated by eosinophilic pneumonitis vs organizing pneumonia and Inverse psoriasis managed topically as well as underlying fibromyalgia. She appears well on Myfortic 720 mg po bid and hydroxychloroquine 200 mg po bid. Her prednisone is being tapered by pulmonary and just reduced from 7.5 mg to 5 mg daily. COVID-19 vaccination provided today.  Lab monitoring for disease activity. Follow-up in 5 months with Carlus Pavlov [facility] and 10 months with myself.  Return in about 10 months (around 05/15/2023).    RECOMMENDATIONS: ??  ????   Diagnosis ICD-10-CM Associated Orders   1. Systemic lupus erythematosus, unspecified SLE type, unspecified organ involvement status (CMS-HCC)  M32.9 Anti-DNA antibody, double-stranded     C3 complement     C4 complement Urinalysis with Microscopy with Culture Reflex     Protein/Creatinine Ratio, Urine     Urine Culture      2. On mycophenolate mofetil therapy  Z79.899 Anti-DNA antibody, double-stranded     C3 complement     C4 complement     Urinalysis with Microscopy with Culture Reflex     Protein/Creatinine Ratio, Urine     Urine Culture      3. Other systemic lupus erythematosus with other organ involvement (CMS-HCC)  M32.19 mycophenolate (MYFORTIC) 360 MG TbEC          Patient Instructions   There is a new COVID-19 booster that came out in September 2023.   We will give you booster today. Skip your Myfortic for a week to improve immune response.     No changes to medication today    The patient indicates understanding of these issues and agrees to the plan as outlined above.  Contact information provided for any concerns or questions in the interim.    I personally spent 25 minutes face-to-face and non-face-to-face in the care of this patient, which includes all pre, intra, and post visit time on the date of service.  All documented time was specific to the E/M visit and does not include any procedures that may have been performed.    Nikia Levels C. Scarlette Calico, MD, PhD  Assistant Professor of Medicine  Department of Medicine/Division of Rheumatology  [facility]  9:01 PM

## 2022-07-14 NOTE — Unmapped (Addendum)
There is a new COVID-19 booster that came out in September 2023.   We will give you booster today. Skip your Myfortic for a week to improve immune response.     No changes to medication today

## 2022-07-14 NOTE — Unmapped (Signed)
Clinical Assessment Needed For: Dose Change  Medication: Mycophenolate 360mg  EC tablet  Last Fill Date/Day Supply: 06/22/2022 / 90 days  Refill Too Soon until 08/29/2022  Was previous dose already scheduled to fill: No    Notes to Pharmacist: Will re-test on 12/26

## 2022-07-15 NOTE — Unmapped (Signed)
Called pt, stated she saw Dr. Scarlette Calico yesterday and was aware.

## 2022-07-18 ENCOUNTER — Telehealth
Admit: 2022-07-18 | Discharge: 2022-07-19 | Payer: MEDICARE | Attending: Student in an Organized Health Care Education/Training Program | Primary: Student in an Organized Health Care Education/Training Program

## 2022-07-18 DIAGNOSIS — F411 Generalized anxiety disorder: Principal | ICD-10-CM

## 2022-07-18 NOTE — Unmapped (Signed)
[facility]  Psychiatry   Established Patient E&M Service - Outpatient       Assessment:  Caitlin Smith presents for follow-up evaluation. Patient reports some mood swings and changes in emotions. She can't identify new stressors in her life. Reviewed sleep hygiene. Reviewed medications (to ensure accuracy). Discussed importance of patient getting plugged into support system including therapy (DBT) vs church group vs family support. Pain is well managed at this time through pain clinic. She denies SI/HI/SIB.     Plan   [ ]  No medication changes   [ ]  Refer patient for psychotherapy   [ ]  Consider increase in klonopin       Identifying Information:  Caitlin Smith is a 66 y.o. female with a history of  MDD, GAD, and Panic Disorder in the context of many chronic medical conditions including chronic pain/fibromyalgia, osteoarthritis with degenerative disc disease, Lupus, pituitary tumor (benign), s/p right total knee arthroplasty, and VIN II (s/p resection), who presents for follow-up evaluation. She has been maintained on Lamictal for many years as well as cymbalta, trazodone, klonopin, and wellbutrin. Patient has previously tolerated a slight taper in her klonopin from 0.75 mg to 0.25 mg qHS overtime. Her mood is often exacerbated by steroid use for current treatment of pulmonary disease. Patient is engaged with CBT therapy at the pain clinic.    Past medications:  Cymbalta  Effexor     Risk Assessment:  An assessment of suicide and violence risk factors was performed as part of this evaluation and is not significantly changed from the last visit. While future psychiatric events cannot be accurately predicted, the patient does not currently require acute inpatient psychiatric care and does not currently meet [facility] involuntary commitment criteria.      Plan:    Problem: Major depression, Anxiety NOS, Panic Disorder   Status of problem: chronic with mild exacerbation  Interventions:   -- Continue Lamictal 400mg  qhs for mood  -- Continue Wellbutrin XL 300 qAM   -- Continue Clonazepam 0.25mg  at bedtime  -- Continue CBT at pain clinic    Problem: Insomnia   Status of problem: chronic and stable  Interventions:   Last sleep study performed in 2017. Per chart review, showed mild OSA likely related to medication use. Neurology recommends patient avoid sleeping on back as she likely has OSA when sleeping in this position  -- Continue melatonin 3-5mg  at bedtime   -- Continue trazodone 150mg  qHS for insomnia (d5/4/21, d 02/05/20, i9/8,)  -- Clonazepam per above    Problem: High Risk Medication monitoring   Status of problem:  Stable   Interventions:   The complexity of this patient's care involves drug therapy which requires intensive monitoring for toxicity. This is in part due to a narrow therapeutic window, as well as potential for toxicity. This patient is being treated with Lamotrigine (Lamictal) to target their psychiatric disorder, Major depressive disorder, refractory. In regards to this medication being considered high risk, Lamotrigine (lamictal) has black box warning for serious rash including fatal reaction of Stevens-Johnson syndrome and rare cases of toxic epidermal necrolysis. In addition to prolonged titration to mitigate risk of serious rash, lamotrigine requires monitoring of hepatic and renal function at baseline, and at times will require monitoring of lamotrigine blood levels.    Problem: Mild Major Neurocognitive Disorder,   Status of problem:  new problem to this provider  Interventions:  Patient seen by Neurology ([facility]) 05/22/18 who dx pt with mild  neurocognitive impairment w/ deficits in inattention and visual-spatial function with suspicion that sleep apnea and polypharmacy may be contributing to her difficulties. Recommended that psychopharm regimen be simplified, including eliminating trazodone and considering changing Wellbutrin to Zoloft or Lexapro for anxiety and eliminating Klonopin when stable.   - However, it is very likely that patient's lupus is significantly contributing to cognitive decline, which will limit improvement she will see with reduction in polypharmacy  -- Last MOCA 22 on 12/12/2017  -- Need to repeat MOCA.     Problem: Polypharmacy   Status of problem:  chronic and stable  Interventions:  -- may be contributing to neurocognitive disorder   -- will consider trazodone and klonopin taper     Lamotrigine Monitoring  - Hepatic function testing, platelets (via CBC) q6 months (06/2022)   - AST/ALT WNL     Effexor   --Impaired renal function can decrease clearance of effexor  -- most recent CrCl 89   -- will continue to monitor along with PCP given dx lupus    Psychotherapy provided:  No billable psychotherapy service provided but brief supportive therapy was utilized.    Patient has been given this writer's contact information as well as the [facility] Psychiatry urgent line number. The patient has been instructed to call 911 for emergencies.    Subjective:  She has been going through some depression. Said she reviewed her medicaitons on Epic with her pain psychologist. She feels like her medications don't seem to be working. Patient endorses some confusion still with taking medications. She would be open to having OT to help manage her symptoms. Reviewed medications to patient.   Mood: Very, very depressed.   Sleep: Poor sleep.   Medications: Review medications at length once again.   Lamictal 200mg  BID (one in morning and one in night) She reports that she is only taking one pill at time. She needs some refills.   Effexor - she is not longer taking that medication (as instructed)   Wellbutrin--Takes 300mg --she says takes twice in the morning.   Clonazepam .25mg  at bedtime.-- takes at night. (1/2 pill)  Trazadone Takes 150mg  (1 pill and half)  Melatonin prn   Safety Concerns/SI/SIB: No safety concerns.          Objective:    Mental Status Exam:    Appearance: Appears stated age, Well nourished and Clean/Neat   Motor:    tremulous, appropriate eye contact    Speech/Language:    Normal rate, volume, tone, fluency and Language intact, well formed   Mood:   Anxious   Affect:   Anxious, Cooperative and Full   Thought process and Associations:   Logical, linear, clear, coherent, goal directed   Abnormal/psychotic thought content:     Denies SI, HI, self harm, delusions, obsessions, paranoid ideation, or ideas of reference   Perceptual disturbances:     Denies auditory and visual hallucinations, behavior not concerning for response to internal stimuli     Other:   insight/judgement intact      LAMICTAL MONITORING: Hepatic function testing, platelets (via CBC) at no defined frequency, but should do at baseline and can do periodically  Lamictal Level: No results found for: LAMOTRIGINE       Patient was seen and plan of care was discussed with the Attending MD, Bottom who agrees with the above statement and plan.        Ovid Curd MD MPH  PGY3 Psychiatry  The patient reports they are currently: at home. I spent 20 minutes on the real-time audio and video with the patient on the date of service. I spent an additional 10 minutes on pre- and post-visit activities on the date of service.     The patient was physically located in [place] or a state in which I am permitted to provide care. The patient and/or parent/guardian understood that s/he may incur co-pays and cost sharing, and agreed to the telemedicine visit. The visit was reasonable and appropriate under the circumstances given the patient's presentation at the time.    The patient and/or parent/guardian has been advised of the potential risks and limitations of this mode of treatment (including, but not limited to, the absence of in-person examination) and has agreed to be treated using telemedicine. The patient's/patient's family's questions regarding telemedicine have been answered.     If the visit was completed in an ambulatory setting, the patient and/or parent/guardian has also been advised to contact their provider???s office for worsening conditions, and seek emergency medical treatment and/or call 911 if the patient deems either necessary.

## 2022-07-19 ENCOUNTER — Ambulatory Visit: Payer: Medicare Other | Admitting: Family Medicine

## 2022-07-19 ENCOUNTER — Institutional Professional Consult (permissible substitution): Admit: 2022-07-19 | Discharge: 2022-07-20 | Payer: MEDICARE | Attending: Psychologist | Primary: Psychologist

## 2022-07-19 DIAGNOSIS — F4310 Post-traumatic stress disorder, unspecified: Principal | ICD-10-CM

## 2022-07-19 DIAGNOSIS — F410 Panic disorder [episodic paroxysmal anxiety] without agoraphobia: Principal | ICD-10-CM

## 2022-07-19 DIAGNOSIS — F411 Generalized anxiety disorder: Principal | ICD-10-CM

## 2022-07-19 DIAGNOSIS — F331 Major depressive disorder, recurrent, moderate: Principal | ICD-10-CM

## 2022-07-19 LAB — ANTI-DNA ANTIBODY, DOUBLE-STRANDED
DSDNA AB TITER: 1:10 {titer}
DSDNA ANTIBODY: POSITIVE — AB

## 2022-07-19 NOTE — Unmapped (Signed)
[facility]   Confidential Psychological Therapy Session      Patient Name: Caitlin Smith  Medical Record Number: [id number]  Date of Service: July 19, 2022  Attending Psychologist: Caroline More, PhD  CPT Procedure Codes: 81191 for 60 mins of face to face counseling    This visit was performed face to face with interactive technology using a HIPPA compliant audio/visual platform. We reviewed confidentiality today. The patient was present in [place], a state in which this provider is licensed and able to provide care (location and contact information confirmed), attended this visit alone, and consented to this virtual pain psychology visit.    REFERRING PHYSICIAN: Clarene Essex, MD    CHIEF COMPLAINT AND REASON FOR VISIT: pain coping skills, CBT to address depression and anxiety in the setting of chronic pain    SUBJECTIVE / HISTORY OF PRESENT ILLNESS: Ms.  Caitlin Smith is a very pleasant 66 y.o.  female from [place], [place] with multiple chronic pain complaints related to fibromyalgia and lupus who initially met with me in October 2016, and which time she was diagnosed with severe depression, PTSD, panic disorder, and generalized anxiety.The patient returns for a therapy session today. Last follow up with me was in 07/05/2022.    The patient participates actively today.  We processed recent stressors utilizing a mindfulness approach.  We reviewed communication strategies and problem solving.  She still discusses concerns related to mood instability and antidepressant medication not working well.  The medications have been adjusted in the past, her mood has tended to improve quite dramatically.  She does not believe mood is related to external or situational stressors.  We processed stressors utilizing a mindfulness approach today and we touched on stress reactivity and behavioral relaxation practice.     Spent extra time practicing mindful breathing and body scan.    OBJECTIVE / MENTAL STATUS:    Appearance:   Appears stated age and Clean/Neat   Motor:  No abnormal movements   Speech/Language:   Normal rate, volume, tone, fluency   Mood:  Depressed and Anxious   Affect:  euthymic   Thought process:  Logical, linear, clear, coherent, goal directed   Thought content:    Denies SI, HI, self harm, delusions, obsessions, paranoid ideation, or ideas of reference   Perceptual disturbances:    Denies auditory and visual hallucinations, behavior not concerning for response to internal stimuli   Orientation:  Oriented to person, place, time, and general circumstances   Attention:  Able to fully attend without fluctuations in consciousness   Concentration:  Able to fully concentrate and attend   Memory:  Immediate, short-term, long-term, and recall grossly intact    Fund of knowledge:   Consistent with level of education and development   Insight:    Fair   Judgment:   Intact   Impulse Control:  Intact     DIAGNOSTIC IMPRESSION:   Post Traumatic Stress Disorder (PTSD)  Generalized Anxiety Disorder (GAD)  Panic Disorder  Major Depressive Disorder, moderate, recurrent  Chronic pain syndrome  Fibromyalgia  Lupus    ASSESSMENT:   Ms.  Caitlin Smith is a very pleasant 66 y.o.  female from [place], [place] with multiple chronic pain complaints related to lupus, arthritis, and fibromyalgia. She was previously seen in our clinic from 2012-2014, and reestablished care with Dr. Fayrene Fearing in August 2016. The patient has struggled with depression and anxiety, but is very motivated to participate in intensive, multidisciplinary treatment to address the connection  between depression, anxiety and pain. She has a long-standing history of depression and anxiety, and has worked with outpatient psychiatrist and therapist over the years. She is currently established with [facility] psychiatry.  In general, depression, anxiety, pain coping and panic have all improved (until recent bout with pulmonary issues), but the patient evidences a seasonal affective pattern of mood changes typically her mood tends to be worse during the winter.      PLAN:   (1) Psychotherapy - Continue CBT. CBT will be used to address PTSD, MDD, Panic D/o, GAD, and chronic pain.     --Behavioral Activation - Encouraged behavioral activation.  Is trying.  --Downloaded and uses Insight Timer, guided relaxation app, for nightly practice.  --Continues to utilize diaphragmatic breathing daily  --encouraged early morning sunlight / light therapy for possible SAD in the setting of chronic MDD    (2) Psychiatry - Pt currently established with [facility].  Is followed by Dr. Evette Cristal (attending) but typically has appointments with a psychiatrist resident.    (3) Safety - Pt denies any current or recent SI or safety concerns. Knows to call 911 or go to her local ED. Also previously given [facility].      (4) follow-up - with me in 2 weeks

## 2022-07-20 ENCOUNTER — Encounter: Payer: Self-pay | Admitting: Family Medicine

## 2022-07-20 MED ORDER — CLONAZEPAM 0.5 MG TABLET
ORAL_TABLET | Freq: Once | ORAL | 0 refills | 0 days | Status: CP | PRN
Start: 2022-07-20 — End: ?

## 2022-07-21 MED ORDER — ATENOLOL 25 MG TABLET
ORAL_TABLET | ORAL | 2 refills | 0 days
Start: 2022-07-21 — End: ?

## 2022-07-21 NOTE — Unmapped (Signed)
Refill request received for patient.      Medication Requested: atenolol  Last Office Visit: 05/13/2022   Next Office Visit: 08/03/2022  Last Prescriber: Phineas Inches    Nurse refill requirements met? No  If not met, why: Pt with increase in med on 05/13/22 with follow-up scheduled for 08/03/22    Sent to: Provider for signing  If sent to provider, which provider?: Cassandra Ramm

## 2022-07-22 MED ORDER — ATENOLOL 25 MG TABLET
ORAL_TABLET | ORAL | 2 refills | 0 days | Status: CP
Start: 2022-07-22 — End: ?

## 2022-08-01 ENCOUNTER — Ambulatory Visit
Admission: RE | Admit: 2022-08-01 | Discharge: 2022-08-01 | Disposition: A | Discharge status: Home / Self Care | Payer: Medicare Other | Source: Ambulatory Visit | Attending: Family Medicine | Admitting: Family Medicine

## 2022-08-01 ENCOUNTER — Telehealth: Admit: 2022-08-01 | Discharge: 2022-08-02 | Payer: MEDICARE | Attending: Psychologist | Primary: Psychologist

## 2022-08-01 DIAGNOSIS — G8929 Other chronic pain: Secondary | ICD-10-CM | POA: Insufficient documentation

## 2022-08-01 DIAGNOSIS — G245 Blepharospasm: Secondary | ICD-10-CM | POA: Diagnosis present

## 2022-08-01 DIAGNOSIS — R519 Headache, unspecified: Secondary | ICD-10-CM | POA: Insufficient documentation

## 2022-08-01 DIAGNOSIS — Z9889 Other specified postprocedural states: Secondary | ICD-10-CM | POA: Diagnosis present

## 2022-08-01 DIAGNOSIS — F4310 Post-traumatic stress disorder, unspecified: Principal | ICD-10-CM

## 2022-08-01 DIAGNOSIS — F411 Generalized anxiety disorder: Principal | ICD-10-CM

## 2022-08-01 DIAGNOSIS — F410 Panic disorder [episodic paroxysmal anxiety] without agoraphobia: Principal | ICD-10-CM

## 2022-08-01 DIAGNOSIS — F331 Major depressive disorder, recurrent, moderate: Principal | ICD-10-CM

## 2022-08-01 MED ORDER — GADOBUTROL 1 MMOL/ML IV SOLN
10.0000 mL | Freq: Once | INTRAVENOUS | Status: AC | PRN
  Administered 2022-08-01: 10 mL via INTRAVENOUS

## 2022-08-01 NOTE — Unmapped (Signed)
[facility]   Confidential Psychological Therapy Session      Patient Name: Caitlin Smith  Medical Record Number: [id number]  Date of Service: August 01, 2022  Attending Psychologist: Caroline More, PhD  CPT Procedure Codes: 81191 for 45 mins of face to face counseling    This visit was performed face to face with interactive technology using a HIPPA compliant audio/visual platform. We reviewed confidentiality today. The patient was present in [place], a state in which this provider is licensed and able to provide care (location and contact information confirmed), attended this visit alone, and consented to this virtual pain psychology visit.    REFERRING PHYSICIAN: Clarene Essex, MD    CHIEF COMPLAINT AND REASON FOR VISIT: pain coping skills, CBT to address depression and anxiety in the setting of chronic pain    SUBJECTIVE / HISTORY OF PRESENT ILLNESS: Ms.  Caitlin Smith is a very pleasant 66 y.o.  female from [place], [place] with multiple chronic pain complaints related to fibromyalgia and lupus who initially met with me in October 2016, and which time she was diagnosed with severe depression, PTSD, panic disorder, and generalized anxiety.The patient returns for a therapy session today. Last follow up with me was in 07/19/2022.    The patient participates actively today.  We processed recent stressors utilizing a mindfulness approach.  We reviewed communication strategies and problem solving.  She still discusses concerns related to medical needs and connected this back to values and trusted relationships.  Has an upcoming appointment with cardiology and pulmonary medicine.  She reflects on improvement in overall symptoms.  Notes pain is well-managed at this time, and connects this to skilled she is practicing that help with openness, awareness, and values based action.    OBJECTIVE / MENTAL STATUS:    Appearance:   Appears stated age and Clean/Neat   Motor:  No abnormal movements   Speech/Language:   Normal rate, volume, tone, fluency   Mood:  Depressed and Anxious   Affect:  euthymic   Thought process:  Logical, linear, clear, coherent, goal directed   Thought content:    Denies SI, HI, self harm, delusions, obsessions, paranoid ideation, or ideas of reference   Perceptual disturbances:    Denies auditory and visual hallucinations, behavior not concerning for response to internal stimuli   Orientation:  Oriented to person, place, time, and general circumstances   Attention:  Able to fully attend without fluctuations in consciousness   Concentration:  Able to fully concentrate and attend   Memory:  Immediate, short-term, long-term, and recall grossly intact    Fund of knowledge:   Consistent with level of education and development   Insight:    Fair   Judgment:   Intact   Impulse Control:  Intact     DIAGNOSTIC IMPRESSION:   Post Traumatic Stress Disorder (PTSD)  Generalized Anxiety Disorder (GAD)  Panic Disorder  Major Depressive Disorder, moderate, recurrent  Chronic pain syndrome  Fibromyalgia  Lupus    ASSESSMENT:   Ms.  Caitlin Smith is a very pleasant 66 y.o.  female from [place], [place] with multiple chronic pain complaints related to lupus, arthritis, and fibromyalgia. She was previously seen in our clinic from 2012-2014, and reestablished care with Dr. Fayrene Fearing in August 2016. The patient has struggled with depression and anxiety, but is very motivated to participate in intensive, multidisciplinary treatment to address the connection between depression, anxiety and pain. She has a long-standing history of depression and anxiety, and has  worked with outpatient psychiatrist and therapist over the years. She is currently established with [facility] psychiatry.  In general, depression, anxiety, pain coping and panic have all improved (until recent bout with pulmonary issues), but the patient evidences a seasonal affective pattern of mood changes typically her mood tends to be worse during the winter.      PLAN:   (1) Psychotherapy - Continue CBT. CBT will be used to address PTSD, MDD, Panic D/o, GAD, and chronic pain.     --Behavioral Activation - Encouraged behavioral activation.  Is trying.  --Downloaded and uses Insight Timer, guided relaxation app, for nightly practice.  --Continues to utilize diaphragmatic breathing daily  --encouraged early morning sunlight / light therapy for possible SAD in the setting of chronic MDD    (2) Psychiatry - Pt currently established with [facility].  Is followed by Dr. Evette Cristal (attending) but typically has appointments with a psychiatrist resident.    (3) Safety - Pt denies any current or recent SI or safety concerns. Knows to call 911 or go to her local ED. Also previously given [facility].      (4) follow-up - with me in 2 weeks

## 2022-08-03 ENCOUNTER — Ambulatory Visit: Admit: 2022-08-03 | Discharge: 2022-08-04 | Payer: MEDICARE | Attending: Adult Health | Primary: Adult Health

## 2022-08-03 ENCOUNTER — Ambulatory Visit: Admit: 2022-08-03 | Discharge: 2022-08-04 | Payer: MEDICARE

## 2022-08-03 DIAGNOSIS — I493 Ventricular premature depolarization: Principal | ICD-10-CM

## 2022-08-03 MED ORDER — ATENOLOL 25 MG TABLET
ORAL_TABLET | Freq: Two times a day (BID) | ORAL | 2 refills | 67 days | Status: CP
Start: 2022-08-03 — End: ?

## 2022-08-03 NOTE — Unmapped (Signed)
DIVISION OF CARDIOLOGY   [facility] [place]        Date of Service: 08/03/2022    Assessment/Plan     1. Palpitations  2.  PVCs  Ziopatch showed no significant arrhythmias. Symptoms correlated with SR or VE (<1% burden)  Echo in December 2022 showed structurally normal heart.  Recent labs including TSH reviewed and unremarkable. No significant caffeine intake, staying well-hydrated.  Had prior sleep study neg for sleep apnea.  Symptoms initially improved w/ uptitration of atenolol but then returned w/ increased stress.   Discussed how low burden of PVCs is generally benign. Discussed importance of stress mgmt.  Will increase atenolol to 37.5 mg bid   Continue daily exercise.     Return to clinic:    Return in about 3 months (around 11/03/2022).    I personally spent 21 minutes face-to-face and non-face-to-face in the care of this patient, which includes all pre, intra, and post visit time on the date of service.      Subjective:   AVW:[id number], Caitlin Spinner, MD  Chief complaint:  66 y.o. female with HTN, SLE, post inflammatory pulmonary fibrosis who presents for f/up of ziopatch and palpitations    History of Present Illness:  Last seen 05/13/22 - Had been having bad heart palpitations requiring ED eval x2 at OSH. Was told it was premature beat based on telemetry.   Occurring 4-5 times per day, most days. Feels like a skipped beat. Sometimes will have associated nausea.   Very bothersome to her.   Endorses high stress levels.   She is doing pulmonary rehab.   Had prior sleep study neg for sleep apnea  No caffeine or ETOh  Atenolol dose increased to 25 bid.     Ziopatch showed no significant arrhythmias. Symptoms correlated with SR or VE (<1% burden)  Echo in December 2022 showed structurally normal heart.    Here today for f/up of results. Palpitations initially improved w/ increase atenolol but have recurred.   Avoiding caffeine. Occurring most days.   Has been under more stress recently due to [facility] not covering [place] next year. This correlated w/ increased skipped beats.   She walks an hour daily for exercise.   Drinking enough water.     Cardiovascular History:  HTN  Previous exercise stress echocardiogram performed at [facility] in May 2021 showed no evidence of ischemia  Previous CT angiogram performed in May 2021 showed no evidence of coronary calcification, this was a PE protocol CT scan which also showed evidence of pulmonary embolism    Past Medical History  Patient Active Problem List   Diagnosis    Hypertension, benign    Benign neoplasm of pituitary gland and craniopharyngeal duct (pouch) (CMS-HCC)    Chronic kidney disease, stage I    Dysphonia    Mixed urge and stress incontinence    Myalgia and myositis    Osteoarthrosis    Proteinuria    Sensorineural hearing loss    VIN II (vulvar intraepithelial neoplasia II)    Family history of breast cancer    Pain medication agreement signed    Episcleritis    SLE (systemic lupus erythematosus) (CMS-HCC)    S/P total knee replacement    Asthma    Postinflammatory pulmonary fibrosis (CMS-HCC)    Other diseases of vocal cords    Regurgitation    Epigastric burning sensation    Aspiration pneumonitis (CMS-HCC)    Back pain    Cough  Multiple pulmonary nodules    Shortness of breath    Vocal cord dysfunction    Screening for cardiovascular condition    Obesity with body mass index 30 or greater    Generalized anxiety disorder    Osteoarthritis    Chronic constipation    Chronic recurrent major depressive disorder (CMS-HCC)    Dyslipidemia    Obesity, diabetes, and hypertension syndrome (CMS-HCC)    Intertrigo    Fibrositis    Gastroesophageal reflux disease without esophagitis    Vitamin D deficiency    Encounter for screening for cardiovascular disorders    Seborrhea capitis    Perennial allergic rhinitis with seasonal variation    Moderate dysplasia of vulva    Spinal stenosis of lumbar region    Keratosis pilaris    Fibromyalgia    Insomnia, persistent    Neck pain    Arthritis, degenerative    Asthma, chronic    Auditory impairment    Bronchiolitis obliterans organizing pneumonia (CMS-HCC)    Chronic nausea    Eczema intertrigo    H/O aspiration pneumonitis    H/O total knee replacement    Lung involvement in systemic lupus erythematosus (CMS-HCC)    Lung nodule, multiple    Neurogenic claudication    Pulmonary nodules    Treadmill stress test negative for angina pectoris    Vertigo    Hyperglycemia    Otalgia    Immunocompromised (CMS-HCC)       Medications:  Current Outpatient Medications   Medication Sig Dispense Refill    albuterol HFA 90 mcg/actuation inhaler Inhale 2 puffs every six (6) hours as needed for wheezing. 8 g 11    amLODIPine (NORVASC) 2.5 MG tablet Take 3 tablets (7.5 mg total) by mouth daily.      aspirin (ECOTRIN) 81 MG tablet Take 1 tablet (81 mg total) by mouth daily.      atorvastatin (LIPITOR) 40 MG tablet Take 1 tablet (40 mg total) by mouth daily.      biotin 1 mg cap Take by mouth.      brompheniramine-pseudoephedrine-DM 2-30-10 mg/5 mL syrup       buPROPion (WELLBUTRIN XL) 300 MG 24 hr tablet Take 1 tablet (300 mg total) by mouth daily. (Patient taking differently: Take 1 tablet (300 mg total) by mouth every morning.) 90 tablet 1    clobetasoL (TEMOVATE) 0.05 % ointment Apply topically Two (2) times a week. 30 g 3    clonazePAM (KLONOPIN) 0.5 MG tablet TAKE 1 TABLET (0.5 MG TOTAL) BY MOUTH ONCE AS NEEDED FOR ANXIETY OR SLEEP FOR UP TO 15 DOSES. 15 tablet 0    clotrimazole-betamethasone (LOTRISONE) 1-0.05 % cream Apply 1 Application topically two (2) times a day.      cyclobenzaprine (FLEXERIL) 5 MG tablet Take 1 tablet (5 mg total) by mouth nightly as needed for muscle spasms. 60 tablet 2    diclofenac sodium (VOLTAREN) 1 % gel Apply 2 g topically two (2) times a day as needed for arthritis. 300 g 5    dicyclomine (BENTYL) 20 mg tablet Take 1 tablet (20 mg total) by mouth daily as needed.      famotidine (PEPCID) 20 MG tablet Take 1 tablet (20 mg total) by mouth Two (2) times a day. 200 tablet 2    fluticasone propionate (FLONASE) 50 mcg/actuation nasal spray       hydrALAZINE (APRESOLINE) 10 MG tablet       hydrOXYchloroQUINE (PLAQUENIL) 200 mg tablet TAKE 1  TABLET BY MOUTH  TWICE DAILY 180 tablet 3    hydrOXYzine (ATARAX) 10 MG tablet       inhalational spacing device (AEROCHAMBER MV) Spcr 1 each by Miscellaneous route two (2) times a day. With Fovent 1 each 0    ketoconazole (NIZORAL) 2 % cream APP EXT AA D  0    lamoTRIgine (LAMICTAL) 200 MG tablet TAKE 1 TABLET BY MOUTH TWICE  DAILY 200 tablet 2    lidocaine (XYLOCAINE) 5 % ointment Apply topically Two (2) times a day. 100 g 2    LINZESS 145 mcg capsule TAKE 1 CAPSULE(145 MCG) BY MOUTH DAILY 90 capsule 3    loratadine (CLARITIN) 10 mg tablet Take 1 tablet (10 mg total) by mouth daily.      meclizine (ANTIVERT) 25 mg tablet Take 1 tablet (25 mg total) by mouth Three (3) times a day as needed.      melatonin 10 mg Tab Take 1 tablet by mouth.      metFORMIN (GLUCOPHAGE-XR) 500 MG 24 hr tablet Take 1 tablet (500 mg total) by mouth daily before breakfast.      mupirocin (BACTROBAN) 2 % ointment APP EXT IEN BID  0    mycophenolate (MYFORTIC) 360 MG TbEC Take 2 tablets (720 mg total) by mouth two (2) times a day. 360 tablet 3    olopatadine (PATANOL) 0.1 % ophthalmic solution Apply 1 drop to eye daily.      omeprazole (PRILOSEC) 40 MG capsule Take 1 capsule (40 mg total) by mouth two (2) times a day. 180 capsule 3    ondansetron (ZOFRAN) 4 MG tablet       polyethylene glycol (GLYCOLAX) 17 gram/dose powder       predniSONE (DELTASONE) 2.5 MG tablet Take 3 tablets (7.5 mg total) by mouth daily. 270 tablet 3    pregabalin (LYRICA) 50 MG capsule       pregabalin (LYRICA) 50 MG capsule Take 1 capsule (50 mg total) by mouth Three (3) times a day. 90 capsule 1    telmisartan (MICARDIS) 80 MG tablet Take 1 tablet (80 mg total) by mouth.      traZODone (DESYREL) 100 MG tablet TAKE 1 AND 1/2 TABLETS BY  MOUTH AT NIGHT (Patient taking differently: Take 1 tablet (100 mg total) by mouth nightly.) 135 tablet 3    triamcinolone (KENALOG) 0.1 % ointment Apply twice a day to affected areas when red and itchy. Stop when improved and use a barrier cream. 80 g 1    venlafaxine (EFFEXOR) 100 MG tablet TAKE 1 TABLET BY MOUTH IN THE  MORNING 30 tablet 11    atenoloL (TENORMIN) 25 MG tablet Take 1.5 tablets (37.5 mg total) by mouth two (2) times a day. 200 tablet 2    benzonatate (TESSALON PERLES) 100 MG capsule Take 1 capsule (100 mg total) by mouth every six (6) hours as needed for cough. (Patient not taking: Reported on 07/14/2022) 30 capsule 1    gabapentin (NEURONTIN) 300 MG capsule Take 1 capsule (300 mg total) by mouth Three (3) times a day. 90 capsule 2     No current facility-administered medications for this visit.       Allergies  Allergies   Allergen Reactions    Vilazodone Swelling    Ace Inhibitors Other (See Comments)     Pt states she can not take ace inhibitors. Pt states she can not remember her reaction.     Bee Pollen Itching  COUGH, WATERY EYES    Penicillin G Rash    Penicillins Rash    Pollen Extracts Itching     COUGHING, WATERY EYES       Social History:   Social History     Tobacco Use    Smoking status: Never    Smokeless tobacco: Never   Vaping Use    Vaping Use: Never used   Substance Use Topics    Alcohol use: Never    Drug use: Never     Comment: No history of IVDU, cocaine, or methamphetamines. No history of anorexigens.       Family History:  Family History   Problem Relation Age of Onset    Breast cancer Mother 84    Cancer Mother     Hypertension Mother     Diabetes Mother     Stomach cancer Father 52        unclear where primary was, died age 64    Cancer Father     Diabetes Sister     Diabetes Sister     Breast cancer Maternal Aunt 19    Cancer Maternal Aunt     Breast cancer Maternal Aunt 41    Cancer Maternal Aunt 53        unk. primary    Breast cancer Maternal Aunt died around age 49, unclear age of diagnosis    Stomach cancer Maternal Uncle         unsure primary, died age 41    Stomach cancer Paternal Aunt         unclear where primary was    Cancer Paternal Uncle         back    No Known Problems Maternal Grandmother     Glaucoma Maternal Grandfather     Stroke Maternal Grandfather     No Known Problems Paternal Grandmother     Stroke Paternal Grandfather     No Known Problems Daughter     Breast cancer Cousin 28        Died age 14. Earl's daughter.     Breast cancer Cousin 60        Treated with mastectomy. Now 51. Jo's daughter.     No Known Problems Other     ADD / ADHD Neg Hx     Alcohol abuse Neg Hx     Anxiety disorder Neg Hx     Bipolar disorder Neg Hx     Dementia Neg Hx     Depression Neg Hx     Drug abuse Neg Hx     OCD Neg Hx     Paranoid behavior Neg Hx     Physical abuse Neg Hx     Schizophrenia Neg Hx     Seizures Neg Hx     Sexual abuse Neg Hx     Colon cancer Neg Hx     Endometrial cancer Neg Hx     Ovarian cancer Neg Hx     BRCA 1/2 Neg Hx     Melanoma Neg Hx     Basal cell carcinoma Neg Hx     Squamous cell carcinoma Neg Hx        ROS- 12 system review is negative other than what is specified in the History of Present Illness.      Objective:   Physical Exam  Vitals:    08/03/22 0957 08/03/22 1007   BP: 149/79 134/80   Pulse: 61 (P) 62  SpO2: 98%    Weight: (!) 105.1 kg (231 lb 9.6 oz)    Height: 170.2 cm (5' 7)      Wt Readings from Last 3 Encounters:   08/03/22 (!) 105.1 kg (231 lb 9.6 oz)   07/14/22 (!) 105.7 kg (233 lb)   07/13/22 (!) 106.5 kg (234 lb 11.2 oz)     General-  Patient is well-appearing in no acute distress  Neurologic- Alert and oriented X3.  Cranial nerve II-XII grossly intact.  Psych- Normal mood, appropriate.    Remainder of exam deferred    Laboratory data:      Component Value Date/Time    PROBNP 12.6 02/01/2017 1039    PROBNP 16 01/23/2014 1241       Lab Results   Component Value Date    WBC 4.7 06/29/2022    HGB 13.6 06/29/2022 HCT 41.7 06/29/2022    PLT 320 06/29/2022       Lab Results   Component Value Date    NA 144 06/29/2022    K 4.6 06/29/2022    CL 108 (H) 06/29/2022    CO2 28.9 06/29/2022    BUN 13 06/29/2022    CREATININE 0.91 06/29/2022    GLU 96 06/29/2022    CALCIUM 9.4 06/29/2022    MG 2.1 12/31/2013    PHOS 4.3 11/04/2021       Lab Results   Component Value Date    TSH 1.350 04/08/2019    ALBUMIN 3.6 06/08/2022    ALT 19 06/08/2022    AST 22 06/08/2022    INR 1.11 01/03/2014       No results found for: LDL, HDL    Electrocardiogram:  From today shows normal sinus rhythm minimal voltage criteria for LVH    Cardiac Monitor:  Patient had a min HR of 52 bpm, max HR of 123 bpm, and avg HR of 67 bpm. Predominant underlying rhythm was Sinus Rhythm. Isolated SVEs were rare (<1.0%), and no SVE Couplets or SVE Triplets were present. Isolated VEs were rare (<1.0%), and no VE Couplets or VE Triplets were present.   Symptoms were associated with sinus rhythm and also isolated VEs.     Echocardiogram 08/27/21:   1. The left ventricular systolic function is normal, LVEF is visually  estimated at 60-65%.    2. The right ventricle is normal in size, with normal systolic function.    3. The pulmonary systolic pressure cannot be estimated due to insufficient  TR signal.      Glade Stanford, AGNP-C  Cardiology Nurse Practitioner  [facility]

## 2022-08-03 NOTE — Unmapped (Addendum)
Increase atenolol to 1.5 tablets twice daily    Work on stress management, stay well hydrated.     Guided meditation, mindfulness  - several apps for this such as Headspace

## 2022-08-07 ENCOUNTER — Encounter: Payer: Self-pay | Admitting: Family Medicine

## 2022-08-08 ENCOUNTER — Other Ambulatory Visit: Payer: Self-pay

## 2022-08-08 ENCOUNTER — Encounter: Payer: Self-pay | Admitting: Family Medicine

## 2022-08-08 DIAGNOSIS — E88819 Insulin resistance, unspecified: Secondary | ICD-10-CM

## 2022-08-08 DIAGNOSIS — I493 Ventricular premature depolarization: Secondary | ICD-10-CM

## 2022-08-08 DIAGNOSIS — L299 Pruritus, unspecified: Secondary | ICD-10-CM

## 2022-08-08 DIAGNOSIS — R002 Palpitations: Secondary | ICD-10-CM

## 2022-08-08 DIAGNOSIS — I10 Essential (primary) hypertension: Secondary | ICD-10-CM

## 2022-08-08 DIAGNOSIS — J302 Other seasonal allergic rhinitis: Secondary | ICD-10-CM

## 2022-08-08 MED ORDER — CYCLOBENZAPRINE 5 MG TABLET
ORAL_TABLET | Freq: Every evening | ORAL | 2 refills | 60 days | PRN
Start: 2022-08-08 — End: ?

## 2022-08-08 MED ORDER — PREGABALIN 50 MG CAPSULE
ORAL_CAPSULE | Freq: Three times a day (TID) | ORAL | 1 refills | 30 days
Start: 2022-08-08 — End: 2023-08-08

## 2022-08-08 MED ORDER — LIDOCAINE 5 % TOPICAL OINTMENT
Freq: Two times a day (BID) | TOPICAL | 2 refills | 0 days
Start: 2022-08-08 — End: 2023-08-08

## 2022-08-09 ENCOUNTER — Other Ambulatory Visit: Payer: Self-pay

## 2022-08-09 MED ORDER — TRAZODONE 100 MG TABLET
ORAL_TABLET | 3 refills | 0 days
Start: 2022-08-09 — End: ?

## 2022-08-09 NOTE — Unmapped (Signed)
Patient Caitlin Smith was contacted today regarding rescheduling an appointment per bump list. Appointment rescheduled and confirmed with patient.

## 2022-08-10 ENCOUNTER — Other Ambulatory Visit: Payer: Self-pay

## 2022-08-10 DIAGNOSIS — R519 Headache, unspecified: Secondary | ICD-10-CM

## 2022-08-10 MED ORDER — LORATADINE 10 MG PO TABS: 10.0000 mg | ORAL_TABLET | Freq: Every day | ORAL | 1 refills | Status: DC

## 2022-08-10 MED ORDER — AMLODIPINE BESYLATE 2.5 MG PO TABS: 7.5000 mg | ORAL_TABLET | Freq: Every day | ORAL | 3 refills | Status: DC

## 2022-08-10 MED ORDER — ATORVASTATIN CALCIUM 40 MG PO TABS: 40.0000 mg | ORAL_TABLET | Freq: Every day | ORAL | 3 refills | Status: DC

## 2022-08-10 MED ORDER — METFORMIN HCL ER 500 MG PO TB24: ORAL_TABLET | ORAL | 1 refills | Status: DC

## 2022-08-10 MED ORDER — HYDROXYZINE HCL 10 MG PO TABS: 10.0000 mg | ORAL_TABLET | Freq: Three times a day (TID) | ORAL | 0 refills | Status: DC | PRN

## 2022-08-10 MED ORDER — TELMISARTAN 80 MG PO TABS: 80.0000 mg | ORAL_TABLET | Freq: Every day | ORAL | 3 refills | Status: DC

## 2022-08-11 ENCOUNTER — Encounter: Payer: Self-pay | Admitting: Internal Medicine

## 2022-08-11 ENCOUNTER — Ambulatory Visit (INDEPENDENT_AMBULATORY_CARE_PROVIDER_SITE_OTHER): Payer: Medicare Other | Admitting: Internal Medicine

## 2022-08-11 ENCOUNTER — Ambulatory Visit: Admit: 2022-08-11 | Payer: MEDICARE

## 2022-08-11 VITALS — BP 130/80 | HR 68 | Temp 98.3°F | Resp 16 | Ht 67.0 in | Wt 231.1 lb

## 2022-08-11 DIAGNOSIS — R634 Abnormal weight loss: Secondary | ICD-10-CM | POA: Diagnosis not present

## 2022-08-11 DIAGNOSIS — K5909 Other constipation: Secondary | ICD-10-CM

## 2022-08-11 DIAGNOSIS — R109 Unspecified abdominal pain: Secondary | ICD-10-CM

## 2022-08-11 DIAGNOSIS — R1031 Right lower quadrant pain: Secondary | ICD-10-CM | POA: Diagnosis not present

## 2022-08-11 LAB — POCT URINALYSIS DIPSTICK
Bilirubin, UA: NEGATIVE
Blood, UA: NEGATIVE
Glucose, UA: NEGATIVE
Ketones, UA: NEGATIVE
Nitrite, UA: NEGATIVE
Odor: NORMAL
Protein, UA: NEGATIVE
Spec Grav, UA: 1.02 (ref 1.010–1.025)
Urobilinogen, UA: 0.2 E.U./dL
pH, UA: 6 (ref 5.0–8.0)

## 2022-08-11 NOTE — Progress Notes (Signed)
   Acute Office Visit  Subjective:     Patient ID: Nicole Ballard, female    DOB: 1956/01/25, 66 y.o.   MRN: [id number]  Chief Complaint  Patient presents with   Abdominal Pain    Flank wraps around to front    HPI Patient is in today for abdominal pain.  Abdominal Pain: -Duration: 1 month  -Onset: gradual -Severity: moderate -Quality: dull and aching -Location:  RLQ  Episode duration:  -Radiation: yes to suprapubic pain  -Frequency: a few times a day; lasted for a few minutes  -Alleviating factors: Nothing  -Aggravating factors: Nothing  -Status: fluctuating -Treatments attempted: none -Fever: no -Nausea: no -Vomiting: no -Weight loss: yes -Decreased appetite: no -Diarrhea: no -Constipation: yes -Blood in stool: no -Dysuria/urinary frequency: no -Hematuria: no  Review of Systems  Constitutional:  Negative for chills and fever.  Respiratory:  Negative for cough.   Cardiovascular:  Negative for chest pain.  Gastrointestinal:  Positive for abdominal pain and constipation. Negative for blood in stool, diarrhea, heartburn, melena, nausea and vomiting.  Genitourinary:  Positive for flank pain. Negative for dysuria, frequency, hematuria and urgency.      Objective:    BP 130/80   Pulse 68   Temp 98.3 F (36.8 C)   Resp 16   Ht '5\' 7"'$  (1.702 m)   Wt 231 lb 1.6 oz (104.8 kg)   SpO2 96%   BMI 36.20 kg/m  BP Readings from Last 3 Encounters:  08/11/22 130/80  07/12/22 126/70  06/06/22 132/70   Wt Readings from Last 3 Encounters:  08/11/22 231 lb 1.6 oz (104.8 kg)  07/12/22 233 lb 9.6 oz (106 kg)  06/06/22 245 lb (111.1 kg)      Physical Exam Constitutional:      Appearance: Normal appearance. She is well-developed.  HENT:     Head: Normocephalic and atraumatic.  Eyes:     Conjunctiva/sclera: Conjunctivae normal.  Cardiovascular:     Rate and Rhythm: Normal rate and regular rhythm.  Pulmonary:     Effort: Pulmonary effort is normal.     Breath  sounds: Normal breath sounds.  Abdominal:     General: Bowel sounds are normal. There is no distension.     Palpations: Abdomen is soft.     Tenderness: There is abdominal tenderness. There is no right CVA tenderness, left CVA tenderness, guarding or rebound.     Comments: Tenderness to RLQ and suprapubic area to palpation   Skin:    General: Skin is warm and dry.  Neurological:     General: No focal deficit present.     Mental Status: She is alert. Mental status is at baseline.  Psychiatric:        Mood and Affect: Mood normal.        Behavior: Behavior normal.     No results found for any visits on 08/11/22.      Assessment & Plan:   1. Flank pain/Right lower quadrant abdominal pain/Weight loss/Chronic constipation: Urine with 1+ leukocytes, no blood or nitrates. Last BM 1 week ago, does have chronic constipation and is on Linzess and Miralax daily. Will start magnesium citrate and drink more fluids. Exam concerning for ongoing RLQ pain, will obtain imaging.   - POCT urinalysis dipstick - Urine Culture - CT Abdomen Pelvis Wo Contrast; Future   Return if symptoms worsen or fail to improve.  Teodora Medici, DO

## 2022-08-12 ENCOUNTER — Encounter: Payer: Self-pay | Admitting: Internal Medicine

## 2022-08-12 LAB — URINE CULTURE
MICRO NUMBER:: 14285220
Result:: NO GROWTH
SPECIMEN QUALITY:: ADEQUATE

## 2022-08-15 ENCOUNTER — Telehealth: Admit: 2022-08-15 | Discharge: 2022-08-16 | Payer: MEDICARE | Attending: Psychologist | Primary: Psychologist

## 2022-08-15 DIAGNOSIS — F411 Generalized anxiety disorder: Principal | ICD-10-CM

## 2022-08-15 DIAGNOSIS — F410 Panic disorder [episodic paroxysmal anxiety] without agoraphobia: Principal | ICD-10-CM

## 2022-08-15 DIAGNOSIS — F4310 Post-traumatic stress disorder, unspecified: Principal | ICD-10-CM

## 2022-08-15 DIAGNOSIS — F331 Major depressive disorder, recurrent, moderate: Principal | ICD-10-CM

## 2022-08-15 NOTE — Unmapped (Signed)
[facility]   Confidential Psychological Therapy Session      Patient Name: Caitlin Smith  Medical Record Number: [id number]  Date of Service: August 15, 2022  Attending Psychologist: Caroline More, PhD  CPT Procedure Codes: 81191 for 60 mins of face to face counseling    This visit was performed face to face with interactive technology using a HIPPA compliant audio/visual platform. We reviewed confidentiality today. The patient was present in [place], a state in which this provider is licensed and able to provide care (location and contact information confirmed), attended this visit alone, and consented to this virtual pain psychology visit.    REFERRING PHYSICIAN: Clarene Essex, MD    CHIEF COMPLAINT AND REASON FOR VISIT: pain coping skills, CBT to address depression and anxiety in the setting of chronic pain    SUBJECTIVE / HISTORY OF PRESENT ILLNESS: Ms.  Caitlin Smith is a very pleasant 66 y.o.  female from [place], [place] with multiple chronic pain complaints related to fibromyalgia and lupus who initially met with me in October 2016, and which time she was diagnosed with severe depression, PTSD, panic disorder, and generalized anxiety.The patient returns for a therapy session today. Last follow up with me was in 08/01/2022.    The patient participates actively today.  We processed recent stressors utilizing a mindfulness approach.  We reviewed communication strategies and problem solving.  She still discusses concerns related to medical needs and connected this back to values and trusted relationships.  Has an upcoming appointment with psychiatry.  She reflects on improvement in overall symptoms, but challenges related to getting her psychiatric medications refilled in a timely way.  Currently she has been out of trazodone and has not been sleeping well.  Notes pain is well-managed at this time, and connects this to skilled she is practicing that help with openness, awareness, and values based action.  Spent extra time today practicing mindful breathing and body scan.    OBJECTIVE / MENTAL STATUS:    Appearance:   Appears stated age and Clean/Neat   Motor:  No abnormal movements   Speech/Language:   Normal rate, volume, tone, fluency   Mood:  Depressed and Anxious   Affect:  euthymic   Thought process:  Logical, linear, clear, coherent, goal directed   Thought content:    Denies SI, HI, self harm, delusions, obsessions, paranoid ideation, or ideas of reference   Perceptual disturbances:    Denies auditory and visual hallucinations, behavior not concerning for response to internal stimuli   Orientation:  Oriented to person, place, time, and general circumstances   Attention:  Able to fully attend without fluctuations in consciousness   Concentration:  Able to fully concentrate and attend   Memory:  Immediate, short-term, long-term, and recall grossly intact    Fund of knowledge:   Consistent with level of education and development   Insight:    Fair   Judgment:   Intact   Impulse Control:  Intact     DIAGNOSTIC IMPRESSION:   Post Traumatic Stress Disorder (PTSD)  Generalized Anxiety Disorder (GAD)  Panic Disorder  Major Depressive Disorder, moderate, recurrent  Chronic pain syndrome  Fibromyalgia  Lupus    ASSESSMENT:   Ms.  Caitlin Smith is a very pleasant 66 y.o.  female from [place], [place] with multiple chronic pain complaints related to lupus, arthritis, and fibromyalgia. She was previously seen in our clinic from 2012-2014, and reestablished care with Dr. Fayrene Fearing in August 2016. The patient has  struggled with depression and anxiety, but is very motivated to participate in intensive, multidisciplinary treatment to address the connection between depression, anxiety and pain. She has a long-standing history of depression and anxiety, and has worked with outpatient psychiatrist and therapist over the years. She is currently established with [facility].  In general, depression, anxiety, pain coping and panic have all improved (until recent bout with pulmonary issues), but the patient evidences a seasonal affective pattern of mood changes typically her mood tends to be worse during the winter.      PLAN:   (1) Psychotherapy - Continue CBT. CBT will be used to address PTSD, MDD, Panic D/o, GAD, and chronic pain.     --Behavioral Activation - Encouraged behavioral activation.  Is trying.  --Downloaded and uses Insight Timer, guided relaxation app, for nightly practice.  --Continues to utilize diaphragmatic breathing daily  --encouraged early morning sunlight / light therapy for possible SAD in the setting of chronic MDD    (2) Psychiatry - Pt currently established with [facility].  Is followed by Dr. Evette Cristal (attending) but typically has appointments with a psychiatrist resident.    (3) Safety - Pt denies any current or recent SI or safety concerns. Knows to call 911 or go to her local ED. Also previously given [facility].      (4) follow-up - with me in 2 weeks

## 2022-08-16 MED ORDER — TRAZODONE 100 MG TABLET
ORAL_TABLET | 3 refills | 0.00000 days
Start: 2022-08-16 — End: ?

## 2022-08-16 NOTE — Unmapped (Signed)
Office Procedure: Percutaneous right superior laryngeal nerve block      08/17/2022    Surgeon: Suzan Nailer, M.D.    Assistant:     Pre-op Diagnosis:   1. Chronic cough      Post-Op Diagnosis:   1. Chronic cough     Clinical notes:   66 y.o.  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   ??  At the end of our previous clinic visit we discussed and recommended clinical course of Respiratory Retraining, Biotene mouthwash, D/C Listerine- switch to salt water gargles   ??  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  ??  December 2018:??The patient notes that her symptoms have been relatively stable. ??She continues to complain of burning tongue.????Following this visit I recommended a thyroid hormone panel and thyroid ultrasound.  ??  August 2019:??The patient reports that she continues to clear there throat and cough in a consistent manner. ??Thyroid ultrasound was unremarkable w/o nodules. She occasionally (rarely ) awakens from sleep with cough. She has done a few sessions with SLP, but claims to have forgotten the techniques. 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 ??I additionally offered behavioral intervention for her cough symptom.  ??  April 2021: In the interim the patient is followed up with neurosurgery and has had radiography demonstrating no additional lesions. ??The patient did however complain of right-sided head throbbing, and otalgia.????Ms. Lamont??notes that she has continued to have balance difficulty, intermittent cough and dysphonia. ??Lastly, she reports that her throat has been burning more than in the past though she has not seen her GI physician for some time. ??The patient reports that she has had ongoing right otalgia that she is treated with hydrogen peroxide. ??She has had her ears flushed by her primary care physician without resolution. ??The patient was referred to otology for right otalgia.  ??  June 2022: Patient returns with concerns for weak vocal projection, worsened over the past several weeks. She attributes this to her LPR, but has been taking her daily Prilosec with minimal relief. Otherwise, she has been doing well, without any additional concerns. Given the stability of the patient's physical examination and findings referable to upper airway inflammation, I have a recommended that we proceed with management of her known LPR. Add Famotidine 20 mg BID to supplement Prilosec 40 mg  ??  October 2022: The patient returns for follow-up.??She states that her voice continues to be hoarse. The patient endorses seasonal allergies. She states that she follows up with pulmonology and was started on albuterol inhaler. She endorses coughing symptoms that has been a past ongoing issue.??The patient??states that??she has the sensation of food getting??caught in her epigastric region. She endorses abdominal pain, right under the ribcage, for the past 4-5 months and has not seen GI.??She describes it as a dull pain that will last for a few minutes before going away. She states that the abdominal pain will occur randomly at rest and has??generally not been benefited by??reflux medication. She endorses difficulty with bowel movement, which has been an ongoing issue in the past.I reassured the patient that her larynx appears healthy on laryngoscopy today. I recommend NeilMed nasal saline irrigation  for general nasopharyngeal hygiene. I will refer the patient to GI for further evaluation of gastric discomfort and constipation.  I offered a referral to GI for gastric discomfort and constipation, and recommended NeilMed nasal irrigations.  ??  October 2023: The patient coughs consistently  with pain in the right ear.  The cough comes throughout the day without clear inciting behaviors.  She needs water to calm it down.  The patient reports having tried reflux medication, particularly due to the fact that she also reports some burning in the throat, however this was unhelpful for her cough symptom.  The patient also reports achiness in the right ear (the same side as her previous skull base retroauricular approach). We discussed interventions for chronic cough (putative neurogenic cough).  These include neuromodulator medicines (patient currently on gabapentin and less interested in beginning another chronic medication), as well as interventions such as SLN injection, and intra laryngeal Botox.  We have decided to proceed to trial of SLN injection on the right, given the prevalence of her symptoms on the right (ear, vocal fold hypomobility)    December 2023: Over the interval since her last visit the patient reports that her cough has been persistent slightly of less intensity recently.  She also notes that she has had some unintended weight loss, and has a specific concern that she may have esophageal cancer, having heard and anecdote about another patient (albeit with no specific current upper GI symptoms).  She request screening for this.      Anesthesia:  None     Procedures:     Percutaneous superior laryngeal nerve block (CPT: [id number])     The location of the right SLN was identified anatomically. A 27 gauge needle was advanced through the overlying skin and the resistance of the right thyrohyoid membrane was palpated.  Approximately 1.0 ml of Kenalog 40 mixed 1:1 with 1% lidocaine with 1 100,000 of epinephrine was  infiltrated in to the surrounding tissue . Thereafter the needle was withdrawn and the site observed for evidence of bleeding  None was detected      The patient tolerated the procedure well     At the conclusion of the procedure the patient was observed and recovered in the procedure room and was dicharged in stable condition.e.  No respiratory distress nor complaints were noted.      Findings:   1.Normal anatomy/high laryngeal location    At the conclusion of the procedure the patient was observed and recovered in the procedure room and was dicharged in stable condition..  No respiratory distress nor complaints were noted.    Estimated Blood Loss (in mL): <38ml    Complications: None     Disposition:    1.  Home in stable condition    2.  Schedule elective double contrast barium swallow with subsequent virtual visit to review the results    Attending Notes:     I performed the entire procedure    Lawrence Marseilles  Answers for HPI/ROS submitted by the patient on 08/13/2022  Chronicity: recurrent  Onset: more than 1 year ago  Progression since onset: rapidly worsening  Frequency: constantly  Cough characteristics: non-productive  chest pain: No  chills: No  ear congestion: No  ear pain: Yes  fever: No  headaches: Yes  heartburn: Yes  hemoptysis: Yes  nasal congestion: No  postnasal drip: No  rash: No  rhinorrhea: No  shortness of breath: Yes  sore throat: No  weight loss: Yes  wheezing: No  Aggravated by: nothing

## 2022-08-17 ENCOUNTER — Ambulatory Visit: Admit: 2022-08-17 | Discharge: 2022-08-18 | Payer: MEDICARE | Attending: Otolaryngology | Primary: Otolaryngology

## 2022-08-17 DIAGNOSIS — R1310 Dysphagia, unspecified: Principal | ICD-10-CM

## 2022-08-17 DIAGNOSIS — R053 Chronic cough: Principal | ICD-10-CM

## 2022-08-17 MED ORDER — TRAZODONE 100 MG TABLET
ORAL_TABLET | Freq: Every evening | ORAL | 3 refills | 90 days | Status: CP
Start: 2022-08-17 — End: ?

## 2022-08-17 MED ADMIN — triamcinolone acetonide (KENALOG-40) injection 40 mg: 40 mg | INTRALESIONAL | @ 19:00:00 | Stop: 2022-08-17

## 2022-08-17 NOTE — Unmapped (Signed)
Addended byRichelle Ito on: 08/17/2022 01:55 PM     Modules accepted: Orders

## 2022-08-18 ENCOUNTER — Telehealth
Admit: 2022-08-18 | Discharge: 2022-08-19 | Payer: MEDICARE | Attending: Student in an Organized Health Care Education/Training Program | Primary: Student in an Organized Health Care Education/Training Program

## 2022-08-18 MED ORDER — CLONAZEPAM 0.5 MG TABLET
ORAL_TABLET | Freq: Two times a day (BID) | ORAL | 0 refills | 30 days | Status: CP | PRN
Start: 2022-08-18 — End: 2022-09-17

## 2022-08-18 NOTE — Unmapped (Signed)
[facility]  Psychiatry   Established Patient E&M Service - Outpatient       Assessment:  Caitlin Smith presents for follow-up evaluation. She has expressed some anxiety about the upcoming Christmas holidays. She feels frustrated and emotionally isolated. She expressed some frustration re: klonopin. Gave patient some emotional and discussed stress tolerance issues.       Plan   [ ]  No medication changes   [ [ Consider MOCA  [ ]  Consider increase in klonopin       Identifying Information:  Caitlin Smith is a 66 y.o. female with a history of  MDD, GAD, and Panic Disorder in the context of many chronic medical conditions including chronic pain/fibromyalgia, osteoarthritis with degenerative disc disease, Lupus, pituitary tumor (benign), s/p right total knee arthroplasty, and VIN II (s/p resection), who presents for follow-up evaluation. She has been maintained on Lamictal for many years as well as cymbalta, trazodone, klonopin, and wellbutrin. Patient has previously tolerated a slight taper in her klonopin from 0.75 mg to 0.25 mg qHS overtime. Her mood is often exacerbated by steroid use for current treatment of pulmonary disease. Patient is engaged with CBT therapy at the pain clinic.    Past medications:  Cymbalta  Effexor     Risk Assessment:  An assessment of suicide and violence risk factors was performed as part of this evaluation and is not significantly changed from the last visit. While future psychiatric events cannot be accurately predicted, the patient does not currently require acute inpatient psychiatric care and does not currently meet [facility] involuntary commitment criteria.      Plan:    Problem: Major depression, Anxiety NOS, Panic Disorder   Status of problem: chronic with mild exacerbation  Interventions:   -- Continue Lamictal 400mg  qhs for mood  -- STOP Wellbutrin XL 300 qAM   -- Continue Clonazepam 0.25mg  at bedtime,   -- Continue CBT at pain clinic    Problem: Insomnia Status of problem: chronic and stable  Interventions:   Last sleep study performed in 2017. Per chart review, showed mild OSA likely related to medication use. Neurology recommends patient avoid sleeping on back as she likely has OSA when sleeping in this position  -- Continue melatonin 3-5mg  at bedtime   -- Continue trazodone 150mg  qHS for insomnia (d5/4/21, d 02/05/20, i9/8,)  -- Clonazepam per above    Problem: High Risk Medication monitoring   Status of problem:  Stable   Interventions:   The complexity of this patient's care involves drug therapy which requires intensive monitoring for toxicity. This is in part due to a narrow therapeutic window, as well as potential for toxicity. This patient is being treated with Lamotrigine (Lamictal) to target their psychiatric disorder, Major depressive disorder, refractory. In regards to this medication being considered high risk, Lamotrigine (lamictal) has black box warning for serious rash including fatal reaction of Stevens-Johnson syndrome and rare cases of toxic epidermal necrolysis. In addition to prolonged titration to mitigate risk of serious rash, lamotrigine requires monitoring of hepatic and renal function at baseline, and at times will require monitoring of lamotrigine blood levels.    Problem: Mild Major Neurocognitive Disorder,   Status of problem:  new problem to this provider  Interventions:  Patient seen by Neurology ([facility]) 05/22/18 who dx pt with mild neurocognitive impairment w/ deficits in inattention and visual-spatial function with suspicion that sleep apnea and polypharmacy may be contributing to her difficulties. Recommended that psychopharm regimen be  simplified, including eliminating trazodone and considering changing Wellbutrin to Zoloft or Lexapro for anxiety and eliminating Klonopin when stable.   - However, it is very likely that patient's lupus is significantly contributing to cognitive decline, which will limit improvement she will see with reduction in polypharmacy  -- Last MOCA 22 on 12/12/2017  -- Need to repeat MOCA.     Problem: Polypharmacy   Status of problem:  chronic and stable  Interventions:  -- may be contributing to neurocognitive disorder   -- will consider trazodone and klonopin taper     Lamotrigine Monitoring  - Hepatic function testing, platelets (via CBC) q6 months (06/2022)   - AST/ALT WNL     Effexor   --Impaired renal function can decrease clearance of effexor  -- most recent CrCl 89   -- will continue to monitor along with PCP given dx lupus    Psychotherapy provided:  No billable psychotherapy service provided but brief supportive therapy was utilized.    Patient has been given this writer's contact information as well as the [facility] Psychiat[facility] urgent line number. The patient has been instructed to call 911 for emergencies.    Subjective:  She has been going through some depression and anger. She expressed anger towards Caitlin Smith why did you ask me to be your doctor? Explored patient's frustration with medicine and her chronic pain. Provided support for patient.She would like to continue working with Clinical research associate. Offered the patient to the opportunity to return to CL clinic or get another resident. Lastly considered completed safety assessment.   Mood: Frustrated.   Safety Concerns/SI/SIB: No safety concerns at this time. She reports that she would be able to reach out to family members if needed.         Objective:    Mental Status Exam:    Appearance:    Appears stated age, Well nourished and Clean/Neat   Motor:    tremulous, appropriate eye contact    Speech/Language:    Normal rate, volume, tone, fluency and Language intact, well formed   Mood:   Anxious   Affect:   Anxious, Cooperative and Full   Thought process and Associations:   Logical, linear, clear, coherent, goal directed   Abnormal/psychotic thought content:     Denies SI, HI, self harm, delusions, obsessions, paranoid ideation, or ideas of reference Perceptual disturbances:     Denies auditory and visual hallucinations, behavior not concerning for response to internal stimuli     Other:   insight/judgement intact      LAMICTAL MONITORING: Hepatic function testing, platelets (via CBC) at no defined frequency, but should do at baseline and can do periodically  Lamictal Level: No results found for: LAMOTRIGINE       Patient was seen and plan of care was discussed with the Attending MD, Bottom who agrees with the above statement and plan.        Ovid Curd MD MPH  PGY3 Psychiatry          The patient reports they are currently: at home. I spent 20 minutes on the real-time audio and video with the patient on the date of service. I spent an additional 10 minutes on pre- and post-visit activities on the date of service.     The patient was physically located in [place] or a state in which I am permitted to provide care. The patient and/or parent/guardian understood that s/he may incur co-pays and cost sharing, and agreed to the telemedicine visit. The visit  was reasonable and appropriate under the circumstances given the patient's presentation at the time.    The patient and/or parent/guardian has been advised of the potential risks and limitations of this mode of treatment (including, but not limited to, the absence of in-person examination) and has agreed to be treated using telemedicine. The patient's/patient's family's questions regarding telemedicine have been answered.     If the visit was completed in an ambulatory setting, the patient and/or parent/guardian has also been advised to contact their provider???s office for worsening conditions, and seek emergency medical treatment and/or call 911 if the patient deems either necessary.

## 2022-08-19 ENCOUNTER — Telehealth: Admit: 2022-08-19 | Discharge: 2022-08-20 | Payer: MEDICARE

## 2022-08-19 DIAGNOSIS — M545 Chronic midline low back pain: Principal | ICD-10-CM

## 2022-08-19 DIAGNOSIS — G894 Chronic pain syndrome: Principal | ICD-10-CM

## 2022-08-19 DIAGNOSIS — M542 Cervicalgia: Principal | ICD-10-CM

## 2022-08-19 DIAGNOSIS — M797 Fibromyalgia: Principal | ICD-10-CM

## 2022-08-19 DIAGNOSIS — M159 Polyosteoarthritis, unspecified: Principal | ICD-10-CM

## 2022-08-19 DIAGNOSIS — Z7952 Long term (current) use of systemic steroids: Principal | ICD-10-CM

## 2022-08-19 DIAGNOSIS — M48061 Spinal stenosis, lumbar region without neurogenic claudication: Principal | ICD-10-CM

## 2022-08-19 DIAGNOSIS — G8929 Other chronic pain: Principal | ICD-10-CM

## 2022-08-19 MED ORDER — CYCLOBENZAPRINE 5 MG TABLET
ORAL_TABLET | Freq: Every evening | ORAL | 5 refills | 60 days | Status: CP | PRN
Start: 2022-08-19 — End: ?

## 2022-08-19 MED ORDER — LIDOCAINE 5 % TOPICAL OINTMENT
Freq: Two times a day (BID) | TOPICAL | 2 refills | 0 days | Status: CP
Start: 2022-08-19 — End: 2023-08-19

## 2022-08-19 MED ORDER — PREGABALIN 50 MG CAPSULE
ORAL_CAPSULE | Freq: Three times a day (TID) | ORAL | 5 refills | 30 days | Status: CP
Start: 2022-08-19 — End: 2023-08-19

## 2022-08-19 MED ORDER — DICLOFENAC 1 % TOPICAL GEL
Freq: Two times a day (BID) | TOPICAL | 5 refills | 75 days | Status: CP | PRN
Start: 2022-08-19 — End: ?

## 2022-08-19 NOTE — Unmapped (Incomplete)
The patient reports they are physically located in [place] and is currently: at home. I conducted a audio/video visit. I spent  47m 07s on the video call with the patient. I spent an additional 20 minutes on pre- and post-visit activities on the date of service .     Chronic Pain Follow Up Note    Assessment and Plan    Caitlin Smith is a 66 y.o.  being followed at [facility] for complaint of chronic pain localized to right knee and bilateral neck, shoulders and upper back myofascial pain and fibromyalgia, and lower back 2/2 lumbar facet arthropathy and DDD and moderate degree of lumbar central spinal canal stenosis. Patient has a history of chronic pain related to lupus and arthritis and is on rheumatological therapy.     Bilateral shoulder pain     Likely multifactorial, myofascial pain in setting of fibromyalgia and OA (radiographs with moderate AC OA). Denies weakness, tingling, radiculopathic pain.  - Continue Voltaren gel TID  - if pain worsening, referral to ortho for possible shoulder injections    Polyneuropathy    Patient endorses numbness in bilateral feet. Transitioned from gabapentin to Lyrica that she was able to escalate to 50 mg tid without any side effects and analgesic benefit  - Continue Lyrica 50mg  TID   - Continue topical lidocaine 5% BID  - Encouraged to look online for TENS unit sock to try out    Chronic pain syndrome; Fibromyalgia   Patient notes that Lyrica and Flexeril are helping with pain.   She has been doing PT for the past 3 months, which transiently exacerbates her pain but she is motivated to continue.  - Continue Flexeril 5 mg once daily PRN, refilled  - Continue vanlafaxine per PCP  - Continue Trazodone 150 mg qhs per PCP  - Continue voltaren Gel  - Continue pain psychology.  - Continue PT and activity    Chronic lower back pain; Facet Arthropathy, likely SIJ Dysfunction     Secondary to SIJ and lumbar facet arthropathy and DDD and moderate degree of lumbar central spinal canal stenosis. Patient also endorsing weakness and worsening pain after walking a couple steps which is concerning for neurogenic claudication from known central canal stenosis which may be progressively worsening. Previously had good relief with L4/5 and L5/S1 facet injections in the past and may benefit from repeat injections. Notably, patient continues to struggle with pulmonary fibrosis and wishes to defer any elective interventional procedures at this time.    -Continue Voltaren gel as above  -Consider repeat bilateral L4/L5 and L5/S1 facet injections and/or SIJ injections in the future once pulmonary status stabilized    Requested Prescriptions     Signed Prescriptions Disp Refills    pregabalin (LYRICA) 50 MG capsule 90 capsule 5     Sig: Take 1 capsule (50 mg total) by mouth Three (3) times a day.    diclofenac sodium (VOLTAREN) 1 % gel 300 g 5     Sig: Apply 2 g topically two (2) times a day as needed for arthritis.    lidocaine (XYLOCAINE) 5 % ointment 100 g 2     Sig: Apply topically two (2) times a day.    cyclobenzaprine (FLEXERIL) 5 MG tablet 60 tablet 5     Sig: Take 1 tablet (5 mg total) by mouth nightly as needed for muscle spasms.     No orders of the defined types were placed in this encounter.  Risks and benefits of above medications including but not limited to possibility of respiratory depression, sedation, and even death were discussed with the patient who expressed an understanding.     Urine toxicology screen is not due today.  Treatment agreement renewal is not due today.     HPI  Caitlin Smith is a 66 y.o. being followed at [facility] for complaint of chronic pain localized to right knee and bilateral neck, shoulder, upper back, and lower back 2/2 lumbar facet arthropathy and DDD. Patient has a history of chronic pain related to lupus, arthritis, and fibromyalgia.     At last visit in October, the patient with likely multifactorial, myofascial pain in setting of fibromyalgia and OA (radiographs with moderate AC OA). Denies weakness, tingling, radiculopathic pain. Likely related to known SLE.  Last hemoglobin A1c 5.7 (12/2020).  Worsened since starting PT.  No appreciable benefit from Gabapentin 300 mg TID which was restarted at last visit, she notes that even at 900mg  TID she does not recall a benefit. Discussed switching gabapentin to Lyrica, patient is interested. Last Cr of 1.04 and AST/ALT of 19/22 on 10/4. Patient endorses numbness in bilateral feet and inquired about socks that may be beneficial, TENS unit socks may be beneficial. Patient notes that she has run out of Flexeril at this time, which she believes was helping. She has been doing PT for the past 3 months, which she states has made her pain worsen at times but she is motivated to continue. Secondary to lumbar facet arthropathy and DDD and moderate degree of lumbar central spinal canal stenosis.  There also seems to be a likely component of sacroiliac joint arthropathy contributing to her pain given + Fortin's and + Faber's on exam with history of bilateral lower back pain at [facility] radiating to her buttocks. Can consider SI joint injections in the future if no significant benefit from repeat facet injections.  Patient also endorsing weakness and worsening pain after walking a couple steps which is concerning for neurogenic claudication from known central canal stenosis which may be progressively worsening. Previously had good relief with L4/5 and L5/S1 facet injections in the past and may benefit from repeat injections. Notably, patient was recently diagnosed with pulmonary fibrosis and has been on prednisone. Her rheumatologist does not want her to have any further steroids at this time. Discussed possible diagnostic block and RFA without steroids, however shared-decision making utilized to hold off on procedures at this time given patient's overall situation currently.    Today, the patient is being seen virtually on her request as she is unable to travel to the clinic. She is being seen for medication refills. She reports doing fair and her pain remains stable. She continues to struggle with pulmonary symptoms. Most of her pain is localized to joints and likely SLE related and LBP. Patient reports tolerating medications well and denies any new onset symptoms or red flags and wishes to continue on medications as currently prescribed.     The patient's medication regimen:  Gabapentin 300 mg TID  Cymbalta 120mg  am (PCP)  Trazodone 150 mg qhs (PCP)  Flexeril 5 mg BID prn  Voltaren gel prn     Current view: Showing all answers           [facility] Return Patient Questionnaire       Question 08/13/2022  8:47 AM EST - Filed by Patient    What is the reason for your visit?  Date of onset of your pain:       Please rate your pain at its WORST in the past month. 6    Please rate your pain at its LEAST in the past month. 5    Please rate your pain as it is RIGHT NOW. 5    Please rate your pain on AVERAGE in the past month. 6    Please circle the location of your pain.       Please select the words that describe your pain. Aching     Burning     Pressing     Pulsing    How often do you have pain? All the time    When is your pain the worst? During the day    Which of the following have been negatively affected by your pain? General activity     Mood     Recreational activities     Walking     Sitting     Standing    Since your last visit:     Have you had any of the following? Primary Care Visit     Mental Health visit    Do you have any new pain you would like to discuss with your doctor? No    How has your pain changed? Stayed the same    Are you currently taking any blood-thinners or anticoagulants? No    If you are on Pain Medication - Are you having any of the following? Confusion     I am stable on my current medication regime    If you have had a procedure since your last visit, how much pain relief was obtained?       If you have had a procedure since your last visit, were there any complications?       General: Weight Loss/Gain     Fatigue     Daytime Drowsiness    Cardiovascular: Heart Palpitations     Shortness of Breath with Exertion     Difficulty Breathing when Laying Flat    Gastrointestinal - (Intestinal): Stomach Pain     Constipation     Gastric Reflux    Skin: Itching     Dryness    Endocrine (Hormonal System):       Musculoskeletal System - (Muscles, Joints and Coverings): Joint Aches/Swelling     Back pain     Muscle Aches/Weakness    Neurologic: Dizziness/Vertigo     Coordination Difficulty     Headaches/Migraines    Psychiatric: Depression     Anxiety     Low Concentration     Panic Attacks          [facility] Patient-Entered Hpi Selection Questionnaire       Question 08/13/2022  8:53 AM EST - Filed by Patient    What is the primary reason for your visit? Back Pain    Your back pain is a... recurring problem    When did you first notice your back pain? More than 1 year ago    How often do you feel back pain? Constantly    Since you first noticed your back pain, how has it changed?       Where is your back pain located? Buttocks     Lower back - above the waist     Lower back - below the waist     Upper back    How would you describe your back pain?  Where does your back pain spread? Left thigh     Right knee     Right thigh    On a scale of 0 to 10 (10 being the worst), how strong is your back pain? 6    Your back pain is... the same all the time    What makes your back pain worse?       When do you feel stiffness in your back?       Are you experiencing any of the following symptoms with your back pain?     Abdominal pain No    Bladder incontinence No    Bowel incontinence No    Chest pain Yes    Painful urination No    Fever No    Headaches Yes    Leg pain       Numbness Yes    Paralysis No    Pins and needles Yes    Pelvic pain No    Numbness around the anus No Tingling Yes    Weakness       Weight loss Yes    Do any of the following apply to you?             Patient denies homicidal/suicidal ideation.    Medication Monitoring  [place] database was reviewed and it was appropriate.  Last urine toxicology screen: N/A, not on opioids  The patient did not bring pill bottles today, not on opioids    Imaging   MRI lumbar spine 10/28/20  FINDINGS:  There is mild lumbar dextrorotatory scoliosis centered at the L2-L3 level. Trace retrolisthesis of L5 on S1. The remaining lumbar vertebral bodies are normally aligned.      Bone marrow signal intensity is normal. The visualized cord is unremarkable and the conus medullaris ends at a normal level.     A Schmorl's node is noted within the inferior endplate of T11. Heights of the remaining vertebral bodies are preserved. There is multilevel disc desiccation. Mild loss of intervertebral disc height at L5-S1.      T12-L1 and L1-L2: No significant canal or neural foraminal stenosis. Mild facet degeneration and ligamentum flavum thickening.     L2-L3: Circumferential disc bulge with mild facet hypertrophy and ligamentum flavum thickening. No significant central canal stenosis. Mild right greater than left neural foraminal narrowing.      L3-L4: Circumferential disc bulge with mild facet arthropathy and moderate ligamentum flavum thickening. Mild central canal stenosis. Mild bilateral neural foraminal narrowing.     L4-L5: Circumferential disc bulge with a tiny, superimposed shallow central disc protrusion. Moderate facet hypertrophy and ligamentum flavum thickening. Mild central spinal canal narrowing. Mild left than right neural foraminal narrowing.     L5-S1: Mild circumferential disc bulge. Mild bilateral facet hypertrophy and ligamentum flavum thickening. No central canal stenosis. Mild bilateral neural foraminal narrowing.      The paraspinal tissues are within normal limits.     For the purposes of this dictation, the lowest well formed intervertebral disc space is assumed to be the L5-S1 level, and there are presumed to be five lumbar-type vertebral bodies.    IMPRESSION:  Multilevel degenerative lumbar spondylosis resulting in at most mild canal or central canal stenosis. Overall, findings are not significantly changed when compared to prior MRI from 2017.    Procedures  Bilateral Intra-articular Lumbar Facet Injections under Fluoroscopic Guidance at L4/5 and L5/S1with steroid 12/29/20      Allergies  Allergies   Allergen Reactions  Vilazodone Swelling    Ace Inhibitors Other (See Comments)     Pt states she can not take ace inhibitors. Pt states she can not remember her reaction.     Bee Pollen Itching     COUGH, WATERY EYES    Penicillin G Rash    Penicillins Rash    Pollen Extracts Itching     COUGHING, WATERY EYES       Home Medications    Current Outpatient Medications   Medication Sig Dispense Refill    albuterol HFA 90 mcg/actuation inhaler Inhale 2 puffs every six (6) hours as needed for wheezing. 8 g 11    amLODIPine (NORVASC) 2.5 MG tablet Take 3 tablets (7.5 mg total) by mouth daily.      aspirin (ECOTRIN) 81 MG tablet Take 1 tablet (81 mg total) by mouth daily.      atenoloL (TENORMIN) 25 MG tablet Take 1.5 tablets (37.5 mg total) by mouth two (2) times a day. 200 tablet 2    atorvastatin (LIPITOR) 40 MG tablet Take 1 tablet (40 mg total) by mouth daily.      benzonatate (TESSALON PERLES) 100 MG capsule Take 1 capsule (100 mg total) by mouth every six (6) hours as needed for cough. (Patient not taking: Reported on 07/14/2022) 30 capsule 1    biotin 1 mg cap Take by mouth.      brompheniramine-pseudoephedrine-DM 2-30-10 mg/5 mL syrup  (Patient not taking: Reported on 08/26/2022)      [START ON 09/12/2022] buPROPion (WELLBUTRIN XL) 300 MG 24 hr tablet Take 1 tablet (300 mg total) by mouth daily. 90 tablet 3    clonazePAM (KLONOPIN) 0.5 MG tablet Take 0.5 tablets (0.25 mg total) by mouth two (2) times a day as needed for anxiety. 30 tablet 0    clotrimazole-betamethasone (LOTRISONE) 1-0.05 % cream Apply 1 Application topically two (2) times a day.      cyclobenzaprine (FLEXERIL) 5 MG tablet Take 1 tablet (5 mg total) by mouth nightly as needed for muscle spasms. 60 tablet 5    diclofenac sodium (VOLTAREN) 1 % gel Apply 2 g topically two (2) times a day as needed for arthritis. 300 g 5    dicyclomine (BENTYL) 20 mg tablet Take 1 tablet (20 mg total) by mouth daily as needed.      famotidine (PEPCID) 20 MG tablet Take 1 tablet (20 mg total) by mouth Two (2) times a day. 200 tablet 2    fluticasone propionate (FLONASE) 50 mcg/actuation nasal spray       hydrALAZINE (APRESOLINE) 10 MG tablet       hydrOXYchloroQUINE (PLAQUENIL) 200 mg tablet TAKE 1 TABLET BY MOUTH  TWICE DAILY 180 tablet 3    hydrOXYzine (ATARAX) 10 MG tablet       inhalational spacing device (AEROCHAMBER MV) Spcr 1 each by Miscellaneous route two (2) times a day. With Fovent 1 each 0    ketoconazole (NIZORAL) 2 % cream APP EXT AA D  0    lamoTRIgine (LAMICTAL) 200 MG tablet TAKE 1 TABLET BY MOUTH TWICE  DAILY 200 tablet 2    lidocaine (XYLOCAINE) 5 % ointment Apply topically two (2) times a day. 100 g 2    LINZESS 145 mcg capsule TAKE 1 CAPSULE(145 MCG) BY MOUTH DAILY 90 capsule 3    loratadine (CLARITIN) 10 mg tablet Take 1 tablet (10 mg total) by mouth daily.      meclizine (ANTIVERT) 25 mg tablet Take 1 tablet (25 mg  total) by mouth Three (3) times a day as needed.      melatonin 10 mg Tab Take 1 tablet by mouth.      metFORMIN (GLUCOPHAGE-XR) 500 MG 24 hr tablet Take 1 tablet (500 mg total) by mouth daily before breakfast.      mupirocin (BACTROBAN) 2 % ointment APP EXT IEN BID  0    mycophenolate (MYFORTIC) 360 MG TbEC Take 2 tablets (720 mg total) by mouth two (2) times a day. 360 tablet 3    olopatadine (PATANOL) 0.1 % ophthalmic solution Apply 1 drop to eye daily.      omeprazole (PRILOSEC) 40 MG capsule Take 1 capsule (40 mg total) by mouth two (2) times a day. 180 capsule 3 ondansetron (ZOFRAN) 4 MG tablet       polyethylene glycol (GLYCOLAX) 17 gram/dose powder       predniSONE (DELTASONE) 2.5 MG tablet Take 3 tablets (7.5 mg total) by mouth daily. 270 tablet 3    pregabalin (LYRICA) 50 MG capsule Take 1 capsule (50 mg total) by mouth Three (3) times a day. 90 capsule 5    telmisartan (MICARDIS) 80 MG tablet Take 1 tablet (80 mg total) by mouth.      traZODone (DESYREL) 100 MG tablet Take 1.5 tablets (150 mg total) by mouth nightly. 135 tablet 3    triamcinolone (KENALOG) 0.1 % ointment Apply twice a day to affected areas when red and itchy. Stop when improved and use a barrier cream. 80 g 1     No current facility-administered medications for this visit.     ROS  See above questionnaire.    Physical Exam    VITALS:   There were no vitals filed for this visit.    Wt Readings from Last 3 Encounters:   08/30/22 (!) 102.4 kg (225 lb 11.2 oz)   08/26/22 (!) 102.6 kg (226 lb 3.2 oz)   08/03/22 (!) 105.1 kg (231 lb 9.6 oz)     Physical exam deferred due to virtual nature of this visit    Answers submitted by the patient for this visit:  Back Pain Questionnaire (Submitted on 08/13/2022)  Chief Complaint: Back pain  Chronicity: recurrent  Onset: more than 1 year ago  Frequency: constantly  Pain location: gluteal, lumbar spine, sacro-iliac, thoracic spine  Radiates to: left thigh, right knee, right thigh  Pain - numeric: 6/10  Pain is: the same all the time  abdominal pain: No  bladder incontinence: No  bowel incontinence: No  chest pain: Yes  dysuria: No  fever: No  headaches: Yes  numbness: Yes  paresis: No  paresthesias: Yes  pelvic pain: No  perianal numbness: No  tingling: Yes  weight loss: Yes pain: No  bladder incontinence: No  bowel incontinence: No  chest pain: Yes  dysuria: No  fever: No  headaches: Yes  numbness: Yes  paresis: No  paresthesias: Yes  pelvic pain: No  perianal numbness: No  tingling: Yes  weight loss: Yes

## 2022-08-19 NOTE — Unmapped (Addendum)
Continue medications as prescribed  Follow up in 6 months or sooner if needed

## 2022-08-23 ENCOUNTER — Encounter: Payer: Self-pay | Admitting: Physician Assistant

## 2022-08-23 ENCOUNTER — Ambulatory Visit (INDEPENDENT_AMBULATORY_CARE_PROVIDER_SITE_OTHER): Payer: Medicare Other | Admitting: Physician Assistant

## 2022-08-23 DIAGNOSIS — Z7409 Other reduced mobility: Secondary | ICD-10-CM | POA: Diagnosis not present

## 2022-08-23 DIAGNOSIS — Z0000 Encounter for general adult medical examination without abnormal findings: Secondary | ICD-10-CM | POA: Diagnosis not present

## 2022-08-23 NOTE — Patient Instructions (Signed)
Nicole Ballard , Thank you for taking time to come for your Medicare Wellness Visit. I appreciate your ongoing commitment to your health goals. Please review the following plan we discussed and let me know if I can assist you in the future.   Screening recommendations/referrals: Colonoscopy: Completed in 2014. Next due in 2024 Mammogram: Completed in 2022- repeat every year  Bone Density: Not due yet  Recommended yearly ophthalmology/optometry visit for glaucoma screening and checkup Recommended yearly dental visit for hygiene and checkup  Vaccinations: Influenza vaccine: Completed for this year  Pneumococcal vaccine: Not done yet  Tdap vaccine: Up to date, next due in 2032 Shingles vaccine: Completed   Covid-19: Please stay up to date on the recommended vaccines and boosters for your age group   Advanced directives: Information provided today in AVS  Conditions/risks identified: Walking and strength concerns   Next appointment: Follow up in one year for your annual wellness visit.   Preventive Care 40-64 Years, Female Preventive care refers to lifestyle choices and visits with your health care provider that can promote health and wellness. What does preventive care include? A yearly physical exam. This is also called an annual well check. Dental exams once or twice a year. Routine eye exams. Ask your health care provider how often you should have your eyes checked. Personal lifestyle choices, including: Daily care of your teeth and gums. Regular physical activity. Eating a healthy diet. Avoiding tobacco and drug use. Limiting alcohol use. Practicing safe sex. Taking low-dose aspirin daily starting at age 77. Taking vitamin and mineral supplements as recommended by your health care provider. What happens during an annual well check? The services and screenings done by your health care provider during your annual well check will depend on your age, overall health, lifestyle risk  factors, and family history of disease. Counseling  Your health care provider may ask you questions about your: Alcohol use. Tobacco use. Drug use. Emotional well-being. Home and relationship well-being. Sexual activity. Eating habits. Work and work Statistician. Method of birth control. Menstrual cycle. Pregnancy history. Screening  You may have the following tests or measurements: Height, weight, and BMI. Blood pressure. Lipid and cholesterol levels. These may be checked every 5 years, or more frequently if you are over 49 years old. Skin check. Lung cancer screening. You may have this screening every year starting at age 49 if you have a 30-pack-year history of smoking and currently smoke or have quit within the past 15 years. Fecal occult blood test (FOBT) of the stool. You may have this test every year starting at age 60. Flexible sigmoidoscopy or colonoscopy. You may have a sigmoidoscopy every 5 years or a colonoscopy every 10 years starting at age 67. Hepatitis C blood test. Hepatitis B blood test. Sexually transmitted disease (STD) testing. Diabetes screening. This is done by checking your blood sugar (glucose) after you have not eaten for a while (fasting). You may have this done every 1-3 years. Mammogram. This may be done every 1-2 years. Talk to your health care provider about when you should start having regular mammograms. This may depend on whether you have a family history of breast cancer. BRCA-related cancer screening. This may be done if you have a family history of breast, ovarian, tubal, or peritoneal cancers. Pelvic exam and Pap test. This may be done every 3 years starting at age 60. Starting at age 40, this may be done every 5 years if you have a Pap test in combination with an  HPV test. Bone density scan. This is done to screen for osteoporosis. You may have this scan if you are at high risk for osteoporosis. Discuss your test results, treatment options, and if  necessary, the need for more tests with your health care provider. Vaccines  Your health care provider may recommend certain vaccines, such as: Influenza vaccine. This is recommended every year. Tetanus, diphtheria, and acellular pertussis (Tdap, Td) vaccine. You may need a Td booster every 10 years. Zoster vaccine. You may need this after age 27. Pneumococcal 13-valent conjugate (PCV13) vaccine. You may need this if you have certain conditions and were not previously vaccinated. Pneumococcal polysaccharide (PPSV23) vaccine. You may need one or two doses if you smoke cigarettes or if you have certain conditions. Talk to your health care provider about which screenings and vaccines you need and how often you need them. This information is not intended to replace advice given to you by your health care provider. Make sure you discuss any questions you have with your health care provider. Document Released: 09/18/2015 Document Revised: 05/11/2016 Document Reviewed: 06/23/2015 Elsevier Interactive Patient Education  2017 [employer] Prevention in the Home Falls can cause injuries. They can happen to people of all ages. There are many things you can do to make your home safe and to help prevent falls. What can I do on the outside of my home? Regularly fix the edges of walkways and driveways and fix any cracks. Remove anything that might make you trip as you walk through a door, such as a raised step or threshold. Trim any bushes or trees on the path to your home. Use bright outdoor lighting. Clear any walking paths of anything that might make someone trip, such as rocks or tools. Regularly check to see if handrails are loose or broken. Make sure that both sides of any steps have handrails. Any raised decks and porches should have guardrails on the edges. Have any leaves, snow, or ice cleared regularly. Use sand or salt on walking paths during winter. Clean up any spills in your  garage right away. This includes oil or grease spills. What can I do in the bathroom? Use night lights. Install grab bars by the toilet and in the tub and shower. Do not use towel bars as grab bars. Use non-skid mats or decals in the tub or shower. If you need to sit down in the shower, use a plastic, non-slip stool. Keep the floor dry. Clean up any water that spills on the floor as soon as it happens. Remove soap buildup in the tub or shower regularly. Attach bath mats securely with double-sided non-slip rug tape. Do not have throw rugs and other things on the floor that can make you trip. What can I do in the bedroom? Use night lights. Make sure that you have a light by your bed that is easy to reach. Do not use any sheets or blankets that are too big for your bed. They should not hang down onto the floor. Have a firm chair that has side arms. You can use this for support while you get dressed. Do not have throw rugs and other things on the floor that can make you trip. What can I do in the kitchen? Clean up any spills right away. Avoid walking on wet floors. Keep items that you use a lot in easy-to-reach places. If you need to reach something above you, use a strong step stool that has a  grab bar. Keep electrical cords out of the way. Do not use floor polish or wax that makes floors slippery. If you must use wax, use non-skid floor wax. Do not have throw rugs and other things on the floor that can make you trip. What can I do with my stairs? Do not leave any items on the stairs. Make sure that there are handrails on both sides of the stairs and use them. Fix handrails that are broken or loose. Make sure that handrails are as long as the stairways. Check any carpeting to make sure that it is firmly attached to the stairs. Fix any carpet that is loose or worn. Avoid having throw rugs at the top or bottom of the stairs. If you do have throw rugs, attach them to the floor with carpet  tape. Make sure that you have a light switch at the top of the stairs and the bottom of the stairs. If you do not have them, ask someone to add them for you. What else can I do to help prevent falls? Wear shoes that: Do not have high heels. Have rubber bottoms. Are comfortable and fit you well. Are closed at the toe. Do not wear sandals. If you use a stepladder: Make sure that it is fully opened. Do not climb a closed stepladder. Make sure that both sides of the stepladder are locked into place. Ask someone to hold it for you, if possible. Clearly mark and make sure that you can see: Any grab bars or handrails. First and last steps. Where the edge of each step is. Use tools that help you move around (mobility aids) if they are needed. These include: Canes. Walkers. Scooters. Crutches. Turn on the lights when you go into a dark area. Replace any light bulbs as soon as they burn out. Set up your furniture so you have a clear path. Avoid moving your furniture around. If any of your floors are uneven, fix them. If there are any pets around you, be aware of where they are. Review your medicines with your doctor. Some medicines can make you feel dizzy. This can increase your chance of falling. Ask your doctor what other things that you can do to help prevent falls. This information is not intended to replace advice given to you by your health care provider. Make sure you discuss any questions you have with your health care provider. Document Released: 06/18/2009 Document Revised: 01/28/2016 Document Reviewed: 09/26/2014 Elsevier Interactive Patient Education  2017 [employer].

## 2022-08-23 NOTE — Progress Notes (Signed)
Virtual Visit via Telephone Note  I connected with  Nicole Ballard on 08/23/22 at 10:00 AM  by telephone and verified that I am speaking with the correct person using two identifiers.  Location: Patient: At home  Provider: [place], [place]    I discussed the limitations, risks, security and privacy concerns of performing an evaluation and management service by telephone and the availability of in person appointments. The patient expressed understanding and agreed to proceed.  Interactive audio and video telecommunications were attempted between this nurse and patient, however failed, due to patient having technical difficulties OR patient did not have access to video capability.  We continued and completed visit with audio only.  Some vital signs may be absent or patient reported.   Subjective:   Nicole Ballard is a 66 y.o. female who presents for Medicare Annual (Subsequent) preventive examination.   Today's Provider: Talitha Givens, MHS, PA-C Introduced myself to the patient as a PA-C and provided education on APPs in clinical practice.    Review of Systems:         Objective:     Vitals: There were no vitals taken for this visit.  There is no height or weight on file to calculate BMI.     08/23/2022   10:34 AM 04/13/2022   10:18 PM 04/05/2022    3:52 PM 02/02/2022    2:13 PM 08/19/2021    1:49 PM 05/25/2021    4:40 PM 01/29/2021    9:39 AM  Advanced Directives  Does Patient Have a Medical Advance Directive? _0  No No  Would patient like information on creating a medical advance directive? Yes (ED - Information included in AVS) No - Patient declined  Yes (MAU/Ambulatory/Procedural Areas - Information given) Yes (MAU/Ambulatory/Procedural Areas - Information given)  No - Patient declined    Tobacco Social History   Tobacco Use  Smoking Status Never  Smokeless Tobacco Never     Counseling given: Not Answered   Clinical  Intake:  Pre-visit preparation completed: Yes  Pain : 0-10 Pain Score: 4  Pain Location: Leg Pain Onset: More than a month ago Pain Frequency: Constant     Nutritional Status: BMI > 30  Obese Nutritional Risks: None Diabetes: No  How often do you need to have someone help you when you read instructions, pamphlets, or other written materials from your doctor or pharmacy?: 1 - Never What is the last grade level you completed in school?: College  Interpreter Needed?: No     Past Medical History:  Diagnosis Date   Anxiety    Bipolar 1 disorder ([place])    Chronic kidney disease    Chronic pain    Diabetes mellitus without complication (HCC)    Fibromyalgia    GERD (gastroesophageal reflux disease)    Hearing loss of right ear    Hemifacial spasm    Hyperlipidemia    Hypertension    Insomnia    Lumbago    Osteoarthritis    Paresthesia    Rhinitis    Systemic lupus erythematosus ([place])    Vitamin D deficiency    Past Surgical History:  Procedure Laterality Date   BRAIN SURGERY     CHOLECYSTECTOMY     VAGINAL HYSTERECTOMY     Family History  Problem Relation Age of Onset   Breast cancer Mother    Cancer Mother 70       Breast   Cancer Father 14  Stomach   Breast cancer Maternal Aunt    Social History   Socioeconomic History   Marital status: Divorced    Spouse name: Not on file   Number of children: 2   Years of education: Not on file   Highest education level: GED or equivalent  Occupational History   Occupation: Disability  Tobacco Use   Smoking status: Never   Smokeless tobacco: Never  Vaping Use   Vaping Use: Never used  Substance and Sexual Activity   Alcohol use: No    Alcohol/week: 0.0 standard drinks of alcohol   Drug use: No   Sexual activity: Not Currently    Partners: Male  Other Topics Concern   Not on file  Social History Narrative   Pt lives alone   Social Determinants of Health   Financial Resource Strain: Low Risk   (08/19/2021)   Overall Financial Resource Strain (CARDIA)    Difficulty of Paying Living Expenses: Not hard at all  Food Insecurity: No Food Insecurity (08/19/2021)   Hunger Vital Sign    Worried About Running Out of Food in the Last Year: Never true    Ran Out of Food in the Last Year: Never true  Transportation Needs: No Transportation Needs (08/19/2021)   PRAPARE - Transportation    Lack of Transportation (Medical): No    Lack of Transportation (Non-Medical): No  Physical Activity: Sufficiently Active (08/19/2021)   Exercise Vital Sign    Days of Exercise per Week: 5 days    Minutes of Exercise per Session: 30 min  Stress: Stress Concern Present (08/19/2021)   [employer] - Occupational Stress Questionnaire    Feeling of Stress : To some extent  Social Connections: Moderately Integrated (08/19/2021)   Social Connection and Isolation Panel [NHANES]    Frequency of Communication with Friends and Family: More than three times a week    Frequency of Social Gatherings with Friends and Family: Once a week    Attends Religious Services: More than 4 times per year    Active Member of Clubs or Organizations: Yes    Attends Club or Organization Meetings: More than 4 times per year    Marital Status: Divorced    Outpatient Encounter Medications as of 08/23/2022  Medication Sig   albuterol (VENTOLIN HFA) 108 (90 Base) MCG/ACT inhaler SMARTSIG:2 Puff(s) By Mouth Every 6 Hours PRN   amLODipine (NORVASC) 2.5 MG tablet Take 3 tablets (7.5 mg total) by mouth daily.   aspirin EC 81 MG tablet Take 1 tablet (81 mg total) by mouth daily.   atenolol (TENORMIN) 25 MG tablet Take 1 tablet by mouth in the morning and at bedtime.   atorvastatin (LIPITOR) 40 MG tablet Take 1 tablet (40 mg total) by mouth daily.   Biotin 1 MG CAPS Take 1 capsule by mouth daily.   buPROPion (WELLBUTRIN XL) 300 MG 24 hr tablet Take 300 mg by mouth in the morning.   Cholecalciferol (VITAMIN D)  2000 UNITS CAPS Take 1 capsule by mouth daily.   clobetasol ointment (TEMOVATE) 0.05 % Apply 1 application topically 2 (two) times daily.   clonazePAM (KLONOPIN) 0.5 MG tablet Take 0.25 mg by mouth at bedtime.   clotrimazole-betamethasone (LOTRISONE) cream Apply 1 application topically 2 (two) times daily.   cyclobenzaprine (FLEXERIL) 5 MG tablet Take by mouth.   diclofenac Sodium (VOLTAREN) 1 % GEL Apply topically daily as needed.   fluticasone (FLONASE) 50 MCG/ACT nasal spray Place 2 sprays into both nostrils   daily.   gabapentin (NEURONTIN) 300 MG capsule Take 300 mg by mouth 3 (three) times daily.   hydroxychloroquine (PLAQUENIL) 200 MG tablet Take by mouth 2 (two) times daily.   hydrOXYzine (ATARAX) 10 MG tablet Take 1 tablet (10 mg total) by mouth 3 (three) times daily as needed.   lamoTRIgine (LAMICTAL) 200 MG tablet Take 400 mg by mouth at bedtime.   LINZESS 145 MCG CAPS capsule Take 145 mcg by mouth daily.   loratadine (CLARITIN) 10 MG tablet Take 1 tablet (10 mg total) by mouth daily.   Melatonin 10 MG TABS Take 1 tablet by mouth at bedtime.   metFORMIN (GLUCOPHAGE-XR) 500 MG 24 hr tablet TAKE 1 TABLET(500 MG) BY MOUTH DAILY WITH BREAKFAST   metoprolol succinate (TOPROL-XL) 25 MG 24 hr tablet Take 1 tablet (25 mg total) by mouth daily. In place of Atenolol   mycophenolate (MYFORTIC) 360 MG TBEC EC tablet Take 720 mg by mouth 2 (two) times daily.   olopatadine (PATANOL) 0.1 % ophthalmic solution Place 1 drop into both eyes 2 (two) times daily.   omeprazole (PRILOSEC) 40 MG capsule Take 1 capsule by mouth in the morning and at bedtime. Pulmonologist   ondansetron (ZOFRAN) 4 MG tablet Take 1 tablet (4 mg total) by mouth every 8 (eight) hours as needed for nausea or vomiting.   Polyethylene Glycol 3350 (MIRALAX PO) Take by mouth.   predniSONE (DELTASONE) 2.5 MG tablet Take 3 tablets by mouth daily at 12 noon.   pregabalin (LYRICA) 50 MG capsule Take 50 mg by mouth 3 (three) times daily.    telmisartan (MICARDIS) 80 MG tablet Take 1 tablet (80 mg total) by mouth daily.   traZODone (DESYREL) 100 MG tablet Take 150 mg by mouth at bedtime.   triamcinolone ointment (KENALOG) 0.1 %    famotidine (PEPCID) 20 MG tablet Take 20 mg by mouth daily.   No facility-administered encounter medications on file as of 08/23/2022.    Activities of Daily Living    08/23/2022   10:35 AM 08/23/2022   10:09 AM  In your present state of health, do you have any difficulty performing the following activities:  Hearing? 1 0  Comment she states she is deaf in her right ear   Vision? 0 0  Difficulty concentrating or making decisions? 1 0  Comment Reports difficulty concentrating   Walking or climbing stairs? 1 0  Dressing or bathing? 0 0  Doing errands, shopping? 0 0  Preparing Food and eating ? N   Using the Toilet? N   In the past six months, have you accidently leaked urine? N   Do you have problems with loss of bowel control? N   Managing your Medications? N   Managing your Finances? N   Housekeeping or managing your Housekeeping? N     Patient Care Team: Sowles, Krichna, MD as PCP - General (Family Medicine) Ishizawar, Rumey, MD as Referring Physician (Rheumatology) Paraschos, Alexander, MD as Consulting Physician (Cardiology) Dedmon, Matthew M, MD as Referring Physician (Otolaryngology) Goetzinger, Amy Michele, PhD as Referring Physician (Psychology) Gala, Gary J, MD as Referring Physician (Psychiatry) Ferrell, Kathleen A, PA (Gastroenterology) Threadgill, Ryan S, MD (Pulmonary Disease)    Assessment:   This is a routine wellness examination for Nicole Ballard.  Exercise Activities and Dietary recommendations Type of exercise: walking, Time (Minutes): 30, Frequency (Times/Week): 3, Weekly Exercise (Minutes/Week): 90, Intensity: Mild   Goals Addressed   None     Fall Risk:    08/23/2022     10:09 AM 08/11/2022    1:02 PM 07/12/2022    1:28 PM 06/06/2022   10:24 AM 04/15/2022     9:19 AM  Fall Risk   Falls in the past year? 0 0 0 0 0  Number falls in past yr: 0 0  0 0  Injury with Fall? 0 0  0 0  Risk for fall due to : No Fall Risks  No Fall Risks No Fall Risks No Fall Risks  Follow up Falls prevention discussed;Education provided;Falls evaluation completed  Falls prevention discussed;Education provided;Falls evaluation completed Falls prevention discussed Falls prevention discussed    FALL RISK PREVENTION PERTAINING TO THE HOME:  Any stairs in or around the home? Yes  If so, are there any without handrails? Yes   Home free of loose throw rugs in walkways, pet beds, electrical cords, etc? Yes  Adequate lighting in your home to reduce risk of falls? Yes   ASSISTIVE DEVICES UTILIZED TO PREVENT FALLS:  Life alert? No - she is interested in getting a life alert bracelet  Use of a cane, walker or w/c? Yes - she uses a cane and walker  Grab bars in the bathroom? No  Shower chair or bench in shower? No  Elevated toilet seat or a handicapped toilet? No   DME ORDERS:  DME order needed?  No   TIMED UP AND GO:  Was the test performed? No Telephone visit.  Length of time to ambulate 10 feet: 0 sec.    Depression Screen    08/23/2022   10:10 AM 08/11/2022    1:02 PM 07/12/2022    1:32 PM 06/21/2022    7:31 AM  PHQ 2/9 Scores  PHQ - 2 Score 2 0 4 2  PHQ- 9 Score 5 0 15 8     Cognitive Function        08/23/2022   10:41 AM 08/13/2020   10:30 AM 08/09/2019    9:57 AM  6CIT Screen  What Year? 0 points 0 points 0 points  What month? 0 points 0 points 0 points  What time? 0 points 0 points 0 points  Count back from 20 0 points 0 points 0 points  Months in reverse 4 points 2 points 2 points  Repeat phrase 2 points 2 points 0 points  Total Score 6 points 4 points 2 points    Immunization History  Administered Date(s) Administered   Hepatitis A, Adult 08/12/2015, 02/10/2016   Hepatitis B, adult 06/26/2007, 08/12/2015, 10/13/2015, 02/10/2016    Influenza Split 06/01/2010, 05/14/2013, 04/19/2014   Influenza, Seasonal, Injecte, Preservative Fre 06/26/2007, 06/02/2011, 12/01/2011, 06/09/2013   Influenza,inj,Quad PF,6+ Mos 04/16/2015, 05/26/2016, 05/17/2017, 05/14/2018, 06/12/2019, 06/15/2020, 06/15/2021, 06/06/2022   Influenza-Unspecified 06/05/2020   MMR 06/26/2007   Measles / Rubella 10/05/1987   Moderna Sars-Covid-2 Vaccination 12/31/2020, 04/29/2021   PNEUMOCOCCAL CONJUGATE-20 09/30/2021   Pneumococcal Conjugate-13 08/06/2015, 05/26/2016   Pneumococcal Polysaccharide-23 02/22/2010, 08/04/2016   Tdap 06/26/2007, 02/22/2010, 07/14/2021   Zoster Recombinat (Shingrix) 12/28/2021, 03/15/2022    Qualifies for Shingles Vaccine? No  Shingrix Completed  Tdap: Up to date  Flu Vaccine: Up to date  Pneumococcal Vaccine: Due for Pneumococcal vaccine. Does the patient want to receive this vaccine today?  No . Education has been provided regarding the importance of this vaccine but still declined. Advised may receive this vaccine at local pharmacy or Health Dept. Aware to provide a copy of the vaccination record if obtained from local pharmacy or Health Dept. Verbalized acceptance and understanding.  Covid-19 Vaccine: Completed vaccines  Screening Tests Health Maintenance  Topic Date Due   Medicare Annual Wellness (AWV)  08/19/2022   MAMMOGRAM  08/26/2022   COVID-19 Vaccine (4 - 2023-24 season) 09/08/2022   PAP SMEAR-Modifier  03/30/2023   COLONOSCOPY (Pts 45-25yr Insurance coverage will need to be confirmed)  08/24/2023   DTaP/Tdap/Td (4 - Td or Tdap) 07/15/2031   INFLUENZA VACCINE  Completed   Hepatitis C Screening  Completed   HIV Screening  Completed   Zoster Vaccines- Shingrix  Completed   HPV VACCINES  Aged Out    Cancer Screenings:  Colorectal Screening: Completed 08/23/2013. Repeat every 10 years  Mammogram: Completed 08/26/2021. Repeat every year. Scheduled for 2023 already   Bone Density: Not due  Lung Cancer  Screening: (Low Dose CT Chest recommended if Age 64-80years, 30 pack-year currently smoking OR have quit w/in 15years.) does not qualify.     Additional Screening:  Hepatitis C Screening: does not qualify; Completed 05/18/21  Vision Screening: Recommended annual ophthalmology exams for early detection of glaucoma and other disorders of the eye. Is the patient up to date with their annual eye exam?  Yes  Who is the provider or what is the name of the office in which the pt attends annual eye exams[place] [facility]: Recommended annual dental exams for proper oral hygiene  Community Resource Referral:  CRR required this visit?  No       Plan:  I have personally reviewed and addressed the Medicare Annual Wellness questionnaire and have noted the following in the patient's chart:  A. Medical and social history B. Use of alcohol, tobacco or illicit drugs  C. Current medications and supplements D. Functional ability and status E.  Nutritional status F.  Physical activity G. Advance directives H. List of other physicians I.  Hospitalizations, surgeries, and ER visits in previous 12 months J.  [place] hearing and vision if needed, cognitive and depression L. Referrals and appointments   In addition, I have reviewed and discussed with patient certain preventive protocols, quality metrics, and best practice recommendations. A written personalized care plan for preventive services as well as general preventive health recommendations were provided to patient.  Signed,    ETalitha Givens MHS, PA-C [facility]

## 2022-08-24 ENCOUNTER — Ambulatory Visit
Admission: RE | Admit: 2022-08-24 | Discharge: 2022-08-24 | Disposition: A | Discharge status: Home / Self Care | Payer: Medicare Other | Source: Ambulatory Visit | Attending: Internal Medicine | Admitting: Internal Medicine

## 2022-08-24 DIAGNOSIS — K5909 Other constipation: Secondary | ICD-10-CM | POA: Insufficient documentation

## 2022-08-24 DIAGNOSIS — R634 Abnormal weight loss: Secondary | ICD-10-CM | POA: Diagnosis present

## 2022-08-24 DIAGNOSIS — R109 Unspecified abdominal pain: Secondary | ICD-10-CM | POA: Diagnosis present

## 2022-08-24 DIAGNOSIS — R1031 Right lower quadrant pain: Secondary | ICD-10-CM | POA: Diagnosis present

## 2022-08-25 ENCOUNTER — Encounter: Payer: Self-pay | Admitting: Internal Medicine

## 2022-08-26 ENCOUNTER — Telehealth: Payer: Self-pay

## 2022-08-26 ENCOUNTER — Ambulatory Visit: Admit: 2022-08-26 | Discharge: 2022-08-26 | Payer: MEDICARE

## 2022-08-26 ENCOUNTER — Ambulatory Visit
Admit: 2022-08-26 | Discharge: 2022-08-26 | Payer: MEDICARE | Attending: Student in an Organized Health Care Education/Training Program | Primary: Student in an Organized Health Care Education/Training Program

## 2022-08-26 DIAGNOSIS — G609 Hereditary and idiopathic neuropathy, unspecified: Principal | ICD-10-CM

## 2022-08-26 DIAGNOSIS — G8929 Other chronic pain: Principal | ICD-10-CM

## 2022-08-26 DIAGNOSIS — R519 Chronic intractable headache, unspecified headache type: Principal | ICD-10-CM

## 2022-08-26 LAB — MONOCLONAL GAMMOPATHY CHEMISTRIES
GAMMAGLOBULIN; IGA: 411.3 mg/dL — ABNORMAL HIGH (ref 70.0–400.0)
GAMMAGLOBULIN; IGG: 1820 mg/dL — ABNORMAL HIGH (ref 650–1600)
GAMMAGLOBULIN; IGM: 162 mg/dL (ref 40–230)
PROTEIN TOTAL (SPECIAL CHEM): 7.7 g/dL (ref 5.7–8.2)

## 2022-08-26 LAB — BASIC METABOLIC PANEL
ANION GAP: 7 mmol/L (ref 5–14)
BLOOD UREA NITROGEN: 13 mg/dL (ref 9–23)
BUN / CREAT RATIO: 14
CALCIUM: 9.7 mg/dL (ref 8.7–10.4)
CHLORIDE: 106 mmol/L (ref 98–107)
CO2: 32.5 mmol/L — ABNORMAL HIGH (ref 20.0–31.0)
CREATININE: 0.95 mg/dL
EGFR CKD-EPI (2021) FEMALE: 67 mL/min/{1.73_m2} (ref >=60)
GLUCOSE RANDOM: 55 mg/dL — ABNORMAL LOW (ref 70–179)
POTASSIUM: 4.2 mmol/L (ref 3.4–4.8)
SODIUM: 145 mmol/L (ref 135–145)

## 2022-08-26 LAB — HM MAMMOGRAPHY

## 2022-08-26 MED ORDER — CLONAZEPAM 0.5 MG TABLET
ORAL_TABLET | Freq: Two times a day (BID) | ORAL | 0 refills | 15 days | Status: CP | PRN
Start: 2022-08-26 — End: 2022-08-18

## 2022-08-26 NOTE — Telephone Encounter (Signed)
Pt given lab results per notes of Dr. Rosana Berger on 08/24/22. Pt verbalized understanding.

## 2022-08-26 NOTE — Unmapped (Addendum)
You were seen in the outpatient neurology clinic today for headaches.    Your headaches are most likely caused by your history of surgery and muscle tension. I will review your recent MRI to see if I detect any other reasons for your headache. I would recommend talking to your pain management doctor to see if they can allow you to take your muscle relaxer when you have a headache, as I think this may improve your headaches.    Your recent MRI brain does show nonspecific white matter disease/changes. These can be from a lot of things including high blood pressure, high cholesterol, diabetes, and aging. The best thing to do to prevent these changes is to control the above risk factors and undergo diet and exercise.    I have also ordered some other laboratory workup for you for neuropathy. Please get this drawn on your way out. Your biggest risk factors at this time are your history of Lupus and a history of prediabetes.
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
## 2022-08-27 ENCOUNTER — Ambulatory Visit: Admit: 2022-08-27 | Discharge: 2022-08-28 | Payer: MEDICARE

## 2022-08-27 DIAGNOSIS — G8929 Other chronic pain: Principal | ICD-10-CM

## 2022-08-27 DIAGNOSIS — R519 Chronic intractable headache, unspecified headache type: Principal | ICD-10-CM

## 2022-08-30 ENCOUNTER — Ambulatory Visit: Admit: 2022-08-30 | Discharge: 2022-08-31 | Payer: MEDICARE

## 2022-08-30 DIAGNOSIS — J8489 Other specified interstitial pulmonary diseases: Principal | ICD-10-CM

## 2022-08-30 DIAGNOSIS — Z803 Family history of malignant neoplasm of breast: Principal | ICD-10-CM

## 2022-08-30 MED ORDER — ALBUTEROL SULFATE HFA 90 MCG/ACTUATION AEROSOL INHALER
Freq: Four times a day (QID) | RESPIRATORY_TRACT | 11 refills | 0 days | PRN
Start: 2022-08-30 — End: 2023-08-30
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
## 2022-08-30 NOTE — Unmapped (Signed)
[facility] Refill Coordination Note    Caitlin Smith, DOB: 06-Nov-1955  Phone: [phone] (home)       All above HIPAA information was verified with patient.         08/30/2022    10:42 AM   Specialty Rx Medication Refill Questionnaire   Which Medications would you like refilled and shipped? Mycophenolate tablets 720 mg   If medication refills are not needed at this time, please indicate the reason below. I will be about out on or before 09/13/22   Please list all current allergies: hay fever   Have you missed any doses in the last 30 days? No   Have you had any changes to your medication(s) since your last refill? No   How many days remaining of each medication do you have at home? About 72   Have you experienced any side effects in the last 30 days? No   Please enter the full address (street address, city, state, zip code) where you would like your medication(s) to be delivered to. [address] [place] [address]   Please specify on which day you would like your medication(s) to arrive. Note: if you need your medication(s) within 3 days, please call the pharmacy to schedule your order at [phone]  09/09/2022   Has your insurance changed since your last refill? Yes   Would you like a pharmacist to call you to discuss your medication(s)? No   Do you require a signature for your package? (Note: if we are billing Medicare Part B or your order contains a controlled substance, we will require a signature) No   Additional Comments: Thanks for reminding me of my refill.         Completed refill call assessment today to schedule patient's medication shipment from the [facility] [phone]).  All relevant notes have been reviewed.       Confirmed patient received a Conservation officer, historic buildings and a Surveyor, mining with first shipment. The patient will receive a drug information handout for each medication shipped and additional FDA Medication Guides as required. REFERRAL TO PHARMACIST     Referral to the pharmacist: Not needed      [facility]     Shipping address confirmed in Epic.     Delivery Scheduled: Yes, Expected medication delivery date: 09/09/22.     Medication will be delivered via Next Day Courier to the prescription address in Epic WAM.    Julianne Rice, PharmD   [facility] Specialty Pharmacist

## 2022-08-30 NOTE — Unmapped (Unsigned)
Patient was screened high risk for falls, the following steps were taken to reduce the risk of falls:    - Patient oriented to environment  - Patient advised to wait to climb onto exam/procedure table/chair until clinic staff could assist  - Physical environment was clear of slip/trip hazards  - Footwear observed to ensure it was appropriate, if not, patient advised on importance of well fitted, closed toe, sturdy shoes with non-skid soles  - Instructed patient and/or caregiver that patient is at risk for falls and encouraged requesting assistance before standing, ambulating or transferring  - ???Preventing falls: Care instructions??? placed in patient AVS

## 2022-08-30 NOTE — Unmapped (Signed)
Pharmacy Refill Request    Flovent hfa    Last OV: 07/13/22    Last written Rx: 07/13/22    Last dispensed:       Upcoming appointment:

## 2022-08-30 NOTE — Unmapped (Signed)
Therapy Update Follow Up: No issues - Copay = $0 for 90ds

## 2022-08-31 ENCOUNTER — Encounter: Payer: Self-pay | Admitting: Internal Medicine

## 2022-08-31 LAB — SERUM FREE LIGHT CHAINS
K/L FLC RATIO: 2 — ABNORMAL HIGH (ref 0.26–1.65)
KAPPA FREE,SERUM: 3.2 mg/dL — ABNORMAL HIGH (ref 0.33–1.94)
LAMBDA FREE, SER: 1.6 mg/dL (ref 0.57–2.63)

## 2022-08-31 LAB — MONOCLONAL GAMMOPATHY WORKUP, SERUM
ALBUMIN (SPE): 4 g/dL (ref 3.5–5.0)
ALPHA-1 GLOBULIN: 0.4 g/dL (ref 0.2–0.5)
ALPHA-2 GLOBULIN: 0.8 g/dL (ref 0.5–1.1)
BETA-1 GLOBULIN: 0.5 g/dL (ref 0.3–0.6)
BETA-2 GLOBULIN: 0.4 g/dL (ref 0.2–0.6)
GAMMAGLOBULIN: 1.7 g/dL — ABNORMAL HIGH (ref 0.5–1.5)
PROTEIN TOTAL (SPECIAL CHEM): 7.7 g/dL

## 2022-08-31 MED ORDER — ALBUTEROL SULFATE HFA 90 MCG/ACTUATION AEROSOL INHALER
Freq: Four times a day (QID) | RESPIRATORY_TRACT | 11 refills | 0 days | Status: CP | PRN
Start: 2022-08-31 — End: 2023-08-31

## 2022-09-09 DIAGNOSIS — M329 Systemic lupus erythematosus, unspecified: Principal | ICD-10-CM

## 2022-09-09 MED ORDER — HYDROXYCHLOROQUINE 200 MG TABLET
ORAL_TABLET | 2 refills | 0 days
Start: 2022-09-09 — End: ?

## 2022-09-12 ENCOUNTER — Telehealth: Admit: 2022-09-12 | Discharge: 2022-09-13 | Payer: MEDICARE | Attending: Psychologist | Primary: Psychologist

## 2022-09-12 DIAGNOSIS — F410 Panic disorder [episodic paroxysmal anxiety] without agoraphobia: Principal | ICD-10-CM

## 2022-09-12 DIAGNOSIS — F4310 Post-traumatic stress disorder, unspecified: Principal | ICD-10-CM

## 2022-09-12 DIAGNOSIS — F411 Generalized anxiety disorder: Principal | ICD-10-CM

## 2022-09-12 DIAGNOSIS — F331 Major depressive disorder, recurrent, moderate: Principal | ICD-10-CM

## 2022-09-12 MED ORDER — BUPROPION HCL XL 300 MG 24 HR TABLET, EXTENDED RELEASE
ORAL_TABLET | Freq: Every day | ORAL | 3 refills | 90 days | Status: CP
Start: 2022-09-12 — End: 2022-08-18

## 2022-09-12 MED ORDER — HYDROXYCHLOROQUINE 200 MG TABLET
ORAL_TABLET | Freq: Two times a day (BID) | ORAL | 3 refills | 90 days | Status: CP
Start: 2022-09-12 — End: ?

## 2022-09-12 NOTE — Unmapped (Signed)
HCQ refill  Last Visit Date: 07/14/2022  Next Visit Date: 11/15/2022

## 2022-09-12 NOTE — Unmapped (Signed)
[facility]   Confidential Psychological Therapy Session      Patient Name: Caitlin Smith  Medical Record Number: [id number]  Date of Service: September 12, 2022  Attending Psychologist: Caroline More, PhD  CPT Procedure Codes: 81191 for 60 mins of face to face counseling    This visit was performed face to face with interactive technology using a HIPPA compliant audio/visual platform. We reviewed confidentiality today. The patient was present in [place], a state in which this provider is licensed and able to provide care (location and contact information confirmed), attended this visit alone, and consented to this virtual pain psychology visit.    REFERRING PHYSICIAN: Clarene Essex, MD    CHIEF COMPLAINT AND REASON FOR VISIT: pain coping skills, CBT to address depression and anxiety in the setting of chronic pain    SUBJECTIVE / HISTORY OF PRESENT ILLNESS: [name] is a very pleasant 67 y.o.  female from [place], [place] with multiple chronic pain complaints related to fibromyalgia and lupus who initially met with me in October 2016, and which time she was diagnosed with severe depression, PTSD, panic disorder, and generalized anxiety.The patient returns for a therapy session today. Last follow up with me was in 08/15/2022.    The patient participates actively today.  We processed recent stressors utilizing a mindfulness approach. She used assertive communication with her psychiatrist and noted she felt the conversation and outcome went better, she also was able to get her needs met, so she is please. She is however very stressed about an issue with her insurance agency - that her insurance may not take [place] providers after 12/05/22. She has many [place] specialists, so this would not be feasible. Her PCP is with [facility], which is covered. Reviewed problem solving today and developed several actions plans to try to better understand and take action.     OBJECTIVE / MENTAL STATUS:    Appearance:   Appears stated age and Clean/Neat   Motor:  No abnormal movements   Speech/Language:   Normal rate, volume, tone, fluency   Mood:  Depressed and Anxious   Affect:  euthymic   Thought process:  Logical, linear, clear, coherent, goal directed   Thought content:    Denies SI, HI, self harm, delusions, obsessions, paranoid ideation, or ideas of reference   Perceptual disturbances:    Denies auditory and visual hallucinations, behavior not concerning for response to internal stimuli   Orientation:  Oriented to person, place, time, and general circumstances   Attention:  Able to fully attend without fluctuations in consciousness   Concentration:  Able to fully concentrate and attend   Memory:  Immediate, short-term, long-term, and recall grossly intact    Fund of knowledge:   Consistent with level of education and development   Insight:    Fair   Judgment:   Intact   Impulse Control:  Intact     DIAGNOSTIC IMPRESSION:   Post Traumatic Stress Disorder (PTSD)  Generalized Anxiety Disorder (GAD)  Panic Disorder  Major Depressive Disorder, moderate, recurrent  Chronic pain syndrome  Fibromyalgia  Lupus    ASSESSMENT:   [name] is a very pleasant 67 y.o.  female from [place], [place] with multiple chronic pain complaints related to lupus, arthritis, and fibromyalgia. She was previously seen in our clinic from 2012-2014, and reestablished care with Dr. Fayrene Fearing in August 2016. The patient has struggled with depression and anxiety, but is very motivated to participate in intensive,  multidisciplinary treatment to address the connection between depression, anxiety and pain. She has a long-standing history of depression and anxiety, and has worked with outpatient psychiatrist and therapist over the years. She is currently established with [facility] psychiatry.  In general, depression, anxiety, pain coping and panic have all improved (until recent bout with pulmonary issues), but the patient evidences a seasonal affective pattern of mood changes typically her mood tends to be worse during the winter.      PLAN:   (1) Psychotherapy - Continue CBT. CBT will be used to address PTSD, MDD, Panic D/o, GAD, and chronic pain.     --Behavioral Activation - Encouraged behavioral activation.  Is trying.  --Downloaded and uses Insight Timer, guided relaxation app, for nightly practice.  --Continues to utilize diaphragmatic breathing daily  --encouraged early morning sunlight / light therapy for possible SAD in the setting of chronic MDD    (2) Psychiatry - Pt currently established with [facility].  Is followed by Dr. Evette Cristal (attending) but typically has appointments with a psychiatrist resident.    (3) Safety - Pt denies any current or recent SI or safety concerns. Knows to call 911 or go to her local ED. Also previously given [facility].      (4) follow-up - with me in 1 month

## 2022-09-13 ENCOUNTER — Encounter: Payer: Self-pay | Admitting: Family Medicine

## 2022-09-14 NOTE — Unmapped (Signed)
[facility] Clinical Assessment     Caitlin Smith, DOB: 08-18-1956  Phone: [phone] (home)     All above HIPAA information was verified with patient.     Was a Nurse, learning disability used for this call? No    Specialty Medication(s):   Inflammatory Disorders: mycophenolate     Current Outpatient Medications   Medication Sig Dispense Refill    albuterol HFA 90 mcg/actuation inhaler Inhale 2 puffs every six (6) hours as needed for wheezing. 8 g 11    amLODIPine (NORVASC) 2.5 MG tablet Take 3 tablets (7.5 mg total) by mouth daily.      aspirin (ECOTRIN) 81 MG tablet Take 1 tablet (81 mg total) by mouth daily.      atenoloL (TENORMIN) 25 MG tablet Take 1.5 tablets (37.5 mg total) by mouth two (2) times a day. 200 tablet 2    atorvastatin (LIPITOR) 40 MG tablet Take 1 tablet (40 mg total) by mouth daily.      benzonatate (TESSALON PERLES) 100 MG capsule Take 1 capsule (100 mg total) by mouth every six (6) hours as needed for cough. (Patient not taking: Reported on 07/14/2022) 30 capsule 1    biotin 1 mg cap Take by mouth.      brompheniramine-pseudoephedrine-DM 2-30-10 mg/5 mL syrup  (Patient not taking: Reported on 08/26/2022)      buPROPion (WELLBUTRIN XL) 300 MG 24 hr tablet Take 1 tablet (300 mg total) by mouth daily. 90 tablet 3    clonazePAM (KLONOPIN) 0.5 MG tablet Take 0.5 tablets (0.25 mg total) by mouth two (2) times a day as needed for anxiety. 30 tablet 0    clotrimazole-betamethasone (LOTRISONE) 1-0.05 % cream Apply 1 Application topically two (2) times a day.      cyclobenzaprine (FLEXERIL) 5 MG tablet Take 1 tablet (5 mg total) by mouth nightly as needed for muscle spasms. 60 tablet 5    diclofenac sodium (VOLTAREN) 1 % gel Apply 2 g topically two (2) times a day as needed for arthritis. 300 g 5    dicyclomine (BENTYL) 20 mg tablet Take 1 tablet (20 mg total) by mouth daily as needed.      famotidine (PEPCID) 20 MG tablet Take 1 tablet (20 mg total) by mouth Two (2) times a day. 200 tablet 2    fluticasone propionate (FLONASE) 50 mcg/actuation nasal spray       hydrALAZINE (APRESOLINE) 10 MG tablet       hydroxychloroquine (PLAQUENIL) 200 mg tablet Take 1 tablet (200 mg total) by mouth two (2) times a day. 180 tablet 3    hydrOXYzine (ATARAX) 10 MG tablet       inhalational spacing device (AEROCHAMBER MV) Spcr 1 each by Miscellaneous route two (2) times a day. With Fovent 1 each 0    ketoconazole (NIZORAL) 2 % cream APP EXT AA D  0    lamoTRIgine (LAMICTAL) 200 MG tablet TAKE 1 TABLET BY MOUTH TWICE  DAILY 200 tablet 2    lidocaine (XYLOCAINE) 5 % ointment Apply topically two (2) times a day. 100 g 2    LINZESS 145 mcg capsule TAKE 1 CAPSULE(145 MCG) BY MOUTH DAILY 90 capsule 3    loratadine (CLARITIN) 10 mg tablet Take 1 tablet (10 mg total) by mouth daily.      meclizine (ANTIVERT) 25 mg tablet Take 1 tablet (25 mg total) by mouth Three (3) times a day as needed.      melatonin 10 mg  Tab Take 1 tablet by mouth.      metFORMIN (GLUCOPHAGE-XR) 500 MG 24 hr tablet Take 1 tablet (500 mg total) by mouth daily before breakfast.      mupirocin (BACTROBAN) 2 % ointment APP EXT IEN BID  0    mycophenolate (MYFORTIC) 360 MG TbEC Take 2 tablets (720 mg total) by mouth two (2) times a day. 360 tablet 3    olopatadine (PATANOL) 0.1 % ophthalmic solution Apply 1 drop to eye daily.      omeprazole (PRILOSEC) 40 MG capsule Take 1 capsule (40 mg total) by mouth two (2) times a day. 180 capsule 3    ondansetron (ZOFRAN) 4 MG tablet       polyethylene glycol (GLYCOLAX) 17 gram/dose powder       predniSONE (DELTASONE) 2.5 MG tablet Take 3 tablets (7.5 mg total) by mouth daily. 270 tablet 3    pregabalin (LYRICA) 50 MG capsule Take 1 capsule (50 mg total) by mouth Three (3) times a day. 90 capsule 5    telmisartan (MICARDIS) 80 MG tablet Take 1 tablet (80 mg total) by mouth.      traZODone (DESYREL) 100 MG tablet Take 1.5 tablets (150 mg total) by mouth nightly. 135 tablet 3    triamcinolone (KENALOG) 0.1 % ointment Apply twice a day to affected areas when red and itchy. Stop when improved and use a barrier cream. 80 g 1     No current facility-administered medications for this visit.        Changes to medications: [name] reports no changes at this time.    Allergies   Allergen Reactions    Vilazodone Swelling    Ace Inhibitors Other (See Comments)     Pt states she can not take ace inhibitors. Pt states she can not remember her reaction.     Bee Pollen Itching     COUGH, WATERY EYES    Penicillin G Rash    Penicillins Rash    Pollen Extracts Itching     COUGHING, WATERY EYES       Changes to allergies: No    SPECIALTY MEDICATION ADHERENCE     mycophenolate 260 mg: 84 days of medicine on hand           Specialty medication(s) dose(s) confirmed: Regimen is correct and unchanged.     Are there any concerns with adherence? No    Adherence counseling provided? Not needed    CLINICAL MANAGEMENT AND INTERVENTION      Clinical Benefit Assessment:    Do you feel the medicine is effective or helping your condition? Yes    Clinical Benefit counseling provided? Progress note from 07/14/22 shows evidence of clinical benefit    Adverse Effects Assessment:    Are you experiencing any side effects? No    Are you experiencing difficulty administering your medicine? No    Quality of Life Assessment:    Quality of Life    Rheumatology  Oncology  Dermatology  Cystic Fibrosis          How many days over the past month did your SLE  keep you from your normal activities? For example, brushing your teeth or getting up in the morning. Getting around can be hard due to fibromyalgia not SLE    Have you discussed this with your provider? Not needed    Acute Infection Status:    Acute infections noted within Epic:  No active infections  Patient reported infection: None  Therapy Appropriateness:    Is therapy appropriate and patient progressing towards therapeutic goals? Yes, therapy is appropriate and should be continued    DISEASE/MEDICATION-SPECIFIC INFORMATION      N/A    Chronic Inflammatory Diseases: Have you experienced any flares in the last month? No    PATIENT SPECIFIC NEEDS     Does the patient have any physical, cognitive, or cultural barriers? No    Is the patient high risk? No    Did the patient require a clinical intervention? No    Does the patient require physician intervention or other additional services (i.e., nutrition, smoking cessation, social work)? No    SOCIAL DETERMINANTS OF HEALTH     At the [facility], we have learned that life circumstances - like trouble affording food, housing, utilities, or transportation can affect the health of many of our patients.   That is why we wanted to ask: are you currently experiencing any life circumstances that are negatively impacting your health and/or quality of life? Patient declined to answer    Social Determinants of Health     Financial Resource Strain: Medium Risk (07/30/2022)    Overall Financial Resource Strain (CARDIA)     Difficulty of Paying Living Expenses: Somewhat hard   Internet Connectivity: Not on file   Food Insecurity: Food Insecurity Present (07/30/2022)    Hunger Vital Sign     Worried About Running Out of Food in the Last Year: Sometimes true     Ran Out of Food in the Last Year: Sometimes true   Tobacco Use: Low Risk  (08/30/2022)    Patient History     Smoking Tobacco Use: Never     Smokeless Tobacco Use: Never     Passive Exposure: Not on file   Housing/Utilities: Low Risk  (07/30/2022)    Housing/Utilities     Within the past 12 months, have you ever stayed: outside, in a car, in a tent, in an overnight shelter, or temporarily in someone else's home (i.e. couch-surfing)?: No     Are you worried about losing your housing?: No     Within the past 12 months, have you been unable to get utilities (heat, electricity) when it was really needed?: No   Alcohol Use: Not on file   Transportation Needs: Unmet Transportation Needs (07/30/2022)    PRAPARE - Therapist, art (Medical): Not on file     Lack of Transportation (Non-Medical): Yes   Substance Use: Not on file   Health Literacy: Not on file   Physical Activity: Not on file   Interpersonal Safety: Not on file   Stress: Not on file   Intimate Partner Violence: Not on file   Depression: Not at risk (04/12/2020)    PHQ-2     PHQ-2 Score: 0   Social Connections: Not on file       Would you be willing to receive help with any of the needs that you have identified today? Not applicable       SHIPPING     Specialty Medication(s) to be Shipped:   N/A     Changes to insurance: No    Delivery Scheduled: Patient declined refill at this time due to patient has almost 3 months of meds.     The patient will receive a drug information handout for each medication shipped and additional FDA Medication Guides as required.  Verified that patient has previously received a Conservation officer, historic buildings and a  Notice of Chief Technology Officer.    The patient or caregiver noted above participated in the development of this care plan and knows that they can request review of or adjustments to the care plan at any time.      All of the patient's questions and concerns have been addressed.    Julianne Rice, PharmD   [facility] Specialty Pharmacist
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
## 2022-09-15 ENCOUNTER — Telehealth: Admit: 2022-09-15 | Discharge: 2022-09-16 | Payer: MEDICARE | Attending: Otolaryngology | Primary: Otolaryngology

## 2022-09-15 ENCOUNTER — Ambulatory Visit: Admit: 2022-09-15 | Discharge: 2022-09-16 | Payer: MEDICARE

## 2022-09-15 DIAGNOSIS — N181 Chronic kidney disease, stage 1: Secondary | ICD-10-CM | POA: Diagnosis not present

## 2022-09-15 DIAGNOSIS — R1310 Dysphagia, unspecified: Secondary | ICD-10-CM | POA: Diagnosis not present

## 2022-09-15 DIAGNOSIS — E1122 Type 2 diabetes mellitus with diabetic chronic kidney disease: Secondary | ICD-10-CM | POA: Diagnosis not present

## 2022-09-15 DIAGNOSIS — K219 Gastro-esophageal reflux disease without esophagitis: Secondary | ICD-10-CM | POA: Diagnosis not present

## 2022-09-15 DIAGNOSIS — J449 Chronic obstructive pulmonary disease, unspecified: Secondary | ICD-10-CM | POA: Diagnosis not present

## 2022-09-15 DIAGNOSIS — Z7982 Long term (current) use of aspirin: Secondary | ICD-10-CM | POA: Diagnosis not present

## 2022-09-15 DIAGNOSIS — K224 Dyskinesia of esophagus: Secondary | ICD-10-CM | POA: Diagnosis not present

## 2022-09-15 DIAGNOSIS — I129 Hypertensive chronic kidney disease with stage 1 through stage 4 chronic kidney disease, or unspecified chronic kidney disease: Secondary | ICD-10-CM | POA: Diagnosis not present

## 2022-09-15 DIAGNOSIS — R053 Chronic cough: Secondary | ICD-10-CM | POA: Diagnosis not present

## 2022-09-15 MED ADMIN — barium sulfate 96 % (w/w) suspension SusR 150 mL: 150 mL | ORAL | @ 17:00:00 | Stop: 2022-09-15

## 2022-09-15 MED ADMIN — sod bicarb-citric ac-simeth 2.21g/1.53g/4g effervescent granules: 1 | ORAL | @ 17:00:00 | Stop: 2022-09-15

## 2022-09-15 MED ADMIN — barium sulfate 98 % powder for suspension 50 mL: 50 mL | ORAL | @ 17:00:00 | Stop: 2022-09-15

## 2022-09-15 MED ADMIN — barium sulfate 700 mg tablet: 1 | ORAL | @ 17:00:00 | Stop: 2022-09-15

## 2022-09-19 MED ORDER — CLONAZEPAM 0.5 MG TABLET
ORAL_TABLET | Freq: Three times a day (TID) | ORAL | 0 refills | 20 days | Status: CP | PRN
Start: 2022-09-19 — End: ?

## 2022-09-26 NOTE — Unmapped (Signed)
This was a telepsychiatry encounter. I was immediately available on site, and I reviewed and discussed the case with the resident, but did not see the patient.  I agree with the assessment and plan as documented in the resident's note.     Fares Ramthun S Donnabelle Blanchard, MD

## 2022-09-28 ENCOUNTER — Encounter: Payer: Self-pay | Admitting: Family Medicine

## 2022-10-01 NOTE — Progress Notes (Deleted)
Psychiatric Initial Adult Assessment   Patient Identification: Nicole Ballard MRN:  [id number] Date of Evaluation:  10/01/2022 Referral Source: *** Chief Complaint:  No chief complaint on file.  Visit Diagnosis: No diagnosis found.  History of Present Illness:   ANALA GALMORE is a 67 y.o. year old female with a history of depression, anxiety, MCI, SLE (diagnosed in 2006, on On mycophenolate mofetil therapy), interstitial lung disease, inverse psoriasis, fibromyalgia, CKD stage I, hypertension, hyperlipidemia, s/p right total knee arthroplasty, , who presents for follow up appointment for below.   - Per chart review, documented by [facility] health care 07/2022 "She has been maintained on Lamictal for many years as well as cymbalta, trazodone, klonopin, and wellbutrin. Patient has previously tolerated a slight taper in her klonopin from 0.75 mg to 0.25 mg qHS overtime. Her mood is often exacerbated by steroid use for current treatment of pulmonary disease. Patient is engaged with CBT therapy at the pain clinic."  - "Patient seen by Neurology ([facility]) 05/22/18 who dx pt with mild neurocognitive impairment w/ deficits in inattention and visual-spatial function with suspicion that sleep apnea and polypharmacy may be contributing to her difficulties. Recommended that psychopharm regimen be simplified, including eliminating trazodone and considering changing Wellbutrin to Zoloft or Lexapro for anxiety and eliminating Klonopin when stable.  - However, it is very likely that patient's lupus is significantly contributing to cognitive decline, which will limit improvement she will see with reduction in polypharmacy -- Last MOCA 22 on 12/12/2017"    Myfortic 720 mg twice daily, Plaquenil 200 mg twice daily, Prednisone 5 mg qd (per pulm).   Does not ike current psychaitry Effexor 100 mg, Lamotrigine 400 mg daily, Wellbutrin 300 mg now, and Klonopin qhs  Lyrica 50 mg tid,  trazodone  EKG HR  409, 67 04/2022  Associated Signs/Symptoms: Depression Symptoms:  {DEPRESSION SYMPTOMS:20000} (Hypo) Manic Symptoms:  {BHH MANIC SYMPTOMS:22872} Anxiety Symptoms:  {BHH ANXIETY SYMPTOMS:22873} Psychotic Symptoms:  {BHH PSYCHOTIC SYMPTOMS:22874} PTSD Symptoms: {BHH PTSD SYMPTOMS:22875}  Past Psychiatric History:  Outpatient:  Psychiatry admission:  Previous suicide attempt:  Past trials of medication:  History of violence:  History of head injury:   Previous Psychotropic Medications: {YES/NO:21197}  Substance Abuse History in the last 12 months:  {yes no:314532}  Consequences of Substance Abuse: {BHH CONSEQUENCES OF SUBSTANCE ABUSE:22880}  Past Medical History:  Past Medical History:  Diagnosis Date   Anxiety    Bipolar 1 disorder ([place])    Chronic kidney disease    Chronic pain    Diabetes mellitus without complication (HCC)    Fibromyalgia    GERD (gastroesophageal reflux disease)    Hearing loss of right ear    Hemifacial spasm    Hyperlipidemia    Hypertension    Insomnia    Lumbago    Osteoarthritis    Paresthesia    Rhinitis    Systemic lupus erythematosus ([place])    Vitamin D deficiency     Past Surgical History:  Procedure Laterality Date   BRAIN SURGERY     CHOLECYSTECTOMY     VAGINAL HYSTERECTOMY      Family Psychiatric History: ***  Family History:  Family History  Problem Relation Age of Onset   Breast cancer Mother    Cancer Mother 28       Breast   Cancer Father 53       Stomach   Breast cancer Maternal Aunt     Social History:   Social History  Socioeconomic History   Marital status: Divorced    Spouse name: Not on file   Number of children: 2   Years of education: Not on file   Highest education level: GED or equivalent  Occupational History   Occupation: Disability  Tobacco Use   Smoking status: Never   Smokeless tobacco: Never  Vaping Use   Vaping Use: Never used  Substance and Sexual Activity   Alcohol use: No     Alcohol/week: 0.0 standard drinks of alcohol   Drug use: No   Sexual activity: Not Currently    Partners: Male  Other Topics Concern   Not on file  Social History Narrative   Pt lives alone   Social Determinants of Health   Financial Resource Strain: Low Risk  (08/19/2021)   Overall Financial Resource Strain (CARDIA)    Difficulty of Paying Living Expenses: Not hard at all  Food Insecurity: No Food Insecurity (08/19/2021)   Hunger Vital Sign    Worried About Running Out of Food in the Last Year: Never true    [place] in the Last Year: Never true  Transportation Needs: No Transportation Needs (08/19/2021)   PRAPARE - Hydrologist (Medical): No    Lack of Transportation (Non-Medical): No  Physical Activity: Sufficiently Active (08/19/2021)   Exercise Vital Sign    Days of Exercise per Week: 5 days    Minutes of Exercise per Session: 30 min  Stress: Stress Concern Present (08/19/2021)   [place]    Feeling of Stress : To some extent  Social Connections: Moderately Integrated (08/19/2021)   Social Connection and Isolation Panel [NHANES]    Frequency of Communication with Friends and Family: More than three times a week    Frequency of Social Gatherings with Friends and Family: Once a week    Attends Religious Services: More than 4 times per year    Active Member of [employer] or Organizations: Yes    Attends Music therapist: More than 4 times per year    Marital Status: Divorced    Additional Social History: ***  Allergies:   Allergies  Allergen Reactions   Ace Inhibitors     Other reaction(s): OTHER Pt states she can not take ace inhibitors. Pt states she can not remember her reaction.    Bee Pollen    Penicillins    Pollen Extract     Metabolic Disorder Labs: Lab Results  Component Value Date   HGBA1C 6.3 (H) 07/12/2022   MPG 134 07/12/2022   MPG 111  06/15/2021   No results found for: "PROLACTIN" Lab Results  Component Value Date   CHOL 140 07/12/2022   TRIG 44 07/12/2022   HDL 71 07/12/2022   CHOLHDL 2.0 07/12/2022   VLDL 10 02/14/2017   LDLCALC 56 07/12/2022   LDLCALC 75 06/15/2021   Lab Results  Component Value Date   TSH 1.920 04/14/2022    Therapeutic Level Labs: No results found for: "LITHIUM" No results found for: "CBMZ" No results found for: "VALPROATE"  Current Medications: Current Outpatient Medications  Medication Sig Dispense Refill   albuterol (VENTOLIN HFA) 108 (90 Base) MCG/ACT inhaler SMARTSIG:2 Puff(s) By Mouth Every 6 Hours PRN     amLODipine (NORVASC) 2.5 MG tablet Take 3 tablets (7.5 mg total) by mouth daily. 270 tablet 3   aspirin EC 81 MG tablet Take 1 tablet (81 mg total) by mouth daily.  30 tablet 0   atenolol (TENORMIN) 25 MG tablet Take 1 tablet by mouth in the morning and at bedtime.     atorvastatin (LIPITOR) 40 MG tablet Take 1 tablet (40 mg total) by mouth daily. 90 tablet 3   Biotin 1 MG CAPS Take 1 capsule by mouth daily. 30 capsule 0   buPROPion (WELLBUTRIN XL) 300 MG 24 hr tablet Take 300 mg by mouth in the morning.     Cholecalciferol (VITAMIN D) 2000 UNITS CAPS Take 1 capsule by mouth daily.     clobetasol ointment (TEMOVATE) [id number] % Apply 1 application topically 2 (two) times daily.     clonazePAM (KLONOPIN) 0.5 MG tablet Take 0.25 mg by mouth at bedtime.     clotrimazole-betamethasone (LOTRISONE) cream Apply 1 application topically 2 (two) times daily. 45 g 0   cyclobenzaprine (FLEXERIL) 5 MG tablet Take by mouth.     diclofenac Sodium (VOLTAREN) 1 % GEL Apply topically daily as needed.     famotidine (PEPCID) 20 MG tablet Take 20 mg by mouth daily.     fluticasone (FLONASE) 50 MCG/ACT nasal spray Place 2 sprays into both nostrils daily. 48 g 3   gabapentin (NEURONTIN) 300 MG capsule Take 300 mg by mouth 3 (three) times daily.     hydroxychloroquine (PLAQUENIL) 200 MG tablet Take by  mouth 2 (two) times daily.     hydrOXYzine (ATARAX) 10 MG tablet Take 1 tablet (10 mg total) by mouth 3 (three) times daily as needed. 30 tablet 0   lamoTRIgine (LAMICTAL) 200 MG tablet Take 400 mg by mouth at bedtime.     LINZESS 145 MCG CAPS capsule Take 145 mcg by mouth daily.     loratadine (CLARITIN) 10 MG tablet Take 1 tablet (10 mg total) by mouth daily. 90 tablet 1   Melatonin 10 MG TABS Take 1 tablet by mouth at bedtime.     metFORMIN (GLUCOPHAGE-XR) 500 MG 24 hr tablet TAKE 1 TABLET(500 MG) BY MOUTH DAILY WITH BREAKFAST 90 tablet 1   metoprolol succinate (TOPROL-XL) 25 MG 24 hr tablet Take 1 tablet (25 mg total) by mouth daily. In place of Atenolol 30 tablet 0   mycophenolate (MYFORTIC) 360 MG TBEC EC tablet Take 720 mg by mouth 2 (two) times daily.     olopatadine (PATANOL) 0.1 % ophthalmic solution Place 1 drop into both eyes 2 (two) times daily. 5 mL 1   omeprazole (PRILOSEC) 40 MG capsule Take 1 capsule by mouth in the morning and at bedtime. Pulmonologist     ondansetron (ZOFRAN) 4 MG tablet Take 1 tablet (4 mg total) by mouth every 8 (eight) hours as needed for nausea or vomiting. 20 tablet 0   Polyethylene Glycol 3350 (MIRALAX PO) Take by mouth.     predniSONE (DELTASONE) 2.5 MG tablet Take 3 tablets by mouth daily at 12 noon.     pregabalin (LYRICA) 50 MG capsule Take 50 mg by mouth 3 (three) times daily.     telmisartan (MICARDIS) 80 MG tablet Take 1 tablet (80 mg total) by mouth daily. 90 tablet 3   traZODone (DESYREL) 100 MG tablet Take 150 mg by mouth at bedtime.     triamcinolone ointment (KENALOG) 0.1 %      No current facility-administered medications for this visit.    Musculoskeletal: Strength & Muscle Tone: within normal limits Gait & Station: normal Patient leans: N/A  Psychiatric Specialty Exam: Review of Systems  There were no vitals taken for this visit.There is  no height or weight on file to calculate BMI.  General Appearance: {Appearance:22683}  Eye  Contact:  {BHH EYE CONTACT:22684}  Speech:  Clear and Coherent  Volume:  Normal  Mood:  {BHH MOOD:22306}  Affect:  {Affect (PAA):22687}  Thought Process:  Coherent  Orientation:  Full (Time, Place, and Person)  Thought Content:  Logical  Suicidal Thoughts:  {ST/HT (PAA):22692}  Homicidal Thoughts:  {ST/HT (PAA):22692}  Memory:  Immediate;   Good  Judgement:  {Judgement (PAA):22694}  Insight:  {Insight (PAA):22695}  Psychomotor Activity:  Normal  Concentration:  Concentration: Good and Attention Span: Good  Recall:  Good  Fund of Knowledge:Good  Language: Good  Akathisia:  No  Handed:  Right  AIMS (if indicated):  not done  Assets:  Communication Skills Desire for Improvement  ADL's:  Intact  Cognition: WNL  Sleep:  {BHH GOOD/FAIR/POOR:22877}   Screenings: GAD-7    Flowsheet Row Office Visit from 08/23/2022 in [facility] Office Visit from 07/12/2022 in [facility] Office Visit from 04/19/2019 in [facility] Office Visit from 09/28/2018 in [facility] Office Visit from 05/14/2018 in [facility]  Total GAD-7 Score '5 15 15 11 14      '$ PHQ2-9    [place] Office Visit from 08/23/2022 in [facility] Office Visit from 08/11/2022 in [facility] Office Visit from 07/12/2022 in [facility] Pulmonary Rehab from 06/21/2022 in [facility] Cardiac and Pulmonary Rehab Office Visit from 06/06/2022 in [facility]  PHQ-2 Total Score 2 0 '4 2 4  '$ PHQ-9 Total Score 5 0 '15 8 14      '$ Flowsheet Row ED from 04/14/2022 in [facility] Emergency Department at [facility] ED from 05/25/2021 in [facility] Emergency Department at [facility] ED from 01/29/2021 in [facility] Emergency Department at [place] No Risk No Risk No Risk        Assessment and Plan:  Assessment  Plan   The patient demonstrates the following risk factors for suicide: Chronic risk factors for suicide include: {Chronic Risk Factors for AS:6451928. Acute risk factors for suicide include: {Acute Risk Factors for SW:8008971. Protective factors for this patient include: {Protective Factors for Suicide CJ:814540. Considering these factors, the overall suicide risk at this point appears to be {Desc; low/moderate/high:110033}. Patient {ACTION; IS/IS VG:[id number] appropriate for outpatient follow up.   Collaboration of Care: {BH OP Collaboration of Care:21014065}  Patient/Guardian was advised Release of Information must be obtained prior to any record release in order to collaborate their care with an outside provider. Patient/Guardian was advised if they have not already done so to contact the registration department to sign all necessary forms in order for [place] to release information regarding their care.   Consent: Patient/Guardian gives verbal consent for treatment and assignment of benefits for services provided during this visit. Patient/Guardian expressed understanding and agreed to proceed.   [name] Clay, MD 1/27/20243:39 PM

## 2022-10-03 ENCOUNTER — Ambulatory Visit: Payer: 59 | Admitting: Psychiatry

## 2022-10-05 MED ORDER — PREGABALIN 50 MG CAPSULE
ORAL_CAPSULE | Freq: Three times a day (TID) | ORAL | 1 refills | 0 days
Start: 2022-10-05 — End: ?

## 2022-10-06 MED ORDER — PREGABALIN 50 MG CAPSULE
ORAL_CAPSULE | Freq: Three times a day (TID) | ORAL | 1 refills | 30 days | Status: CP
Start: 2022-10-06 — End: 2023-10-06

## 2022-10-07 NOTE — Progress Notes (Deleted)
Name: Nicole Ballard   MRN: [id number]    DOB: 03-07-1956   Date:10/07/2022       Progress Note  Subjective  Chief Complaint  Side Pain  HPI  *** Patient Active Problem List   Diagnosis Date Noted   Interstitial lung disease ([place]) 07/12/2022   Empty sella ([place]) 07/12/2022   History of craniotomy 07/12/2022   Immunocompromised ([place]) 04/29/2021   Hyperglycemia 10/23/2017   Vertigo 09/29/2016   Bronchiolitis obliterans organizing pneumonia ([place]) 09/29/2016   Neck pain, musculoskeletal 12/15/2015   Cough in adult 09/11/2015   Abnormal CAT scan 04/11/2015   Auditory impairment 04/11/2015   Insomnia, persistent 04/11/2015   Chronic kidney disease (CKD), stage I 04/11/2015   Chronic nonmalignant pain 04/11/2015   Chronic constipation 04/11/2015   Dyslipidemia 04/11/2015   Fibromyalgia 04/11/2015   Gastro-esophageal reflux disease without esophagitis 04/11/2015   Dysphonia 04/11/2015   Low back pain 04/11/2015   Eczema intertrigo 04/11/2015   Keratosis pilaris 04/11/2015   Chronic recurrent major depressive disorder ([place]) 123456   Dysmetabolic syndrome 123456   Neurogenic claudication 04/11/2015   Perennial allergic rhinitis with seasonal variation 04/11/2015   Abnormal presence of protein in urine 04/11/2015   Seborrhea capitis 04/11/2015   Moderate dysplasia of vulva 04/11/2015   Vitamin D deficiency 04/11/2015   Spinal stenosis of lumbar region 04/11/2015   Treadmill stress test negative for angina pectoris 03/05/2015   Shortness of breath 05/20/2014   Morbid obesity ([place]) 02/03/2014   Asthma, chronic 12/31/2013   H/O total knee replacement 12/31/2013   Episcleritis 09/26/2013   Vocal cord dysfunction 05/28/2013   Anxiety, generalized 03/19/2013   Back pain 12/18/2012   Pulmonary nodules 11/26/2012   Lung nodule, multiple 11/26/2012   Arthritis, degenerative 11/01/2012   H/O aspiration pneumonitis 10/22/2012   Postinflammatory pulmonary fibrosis ([place])  10/22/2012   Mixed incontinence 05/04/2012   Lung involvement in systemic lupus erythematosus ([place]) 11/05/2010   Chronic nausea 07/01/2008   Benign hypertension 01/25/2005    Past Surgical History:  Procedure Laterality Date   BRAIN SURGERY     CHOLECYSTECTOMY     VAGINAL HYSTERECTOMY      Family History  Problem Relation Age of Onset   Breast cancer Mother    Cancer Mother 63       Breast   Cancer Father 29       Stomach   Breast cancer Maternal Aunt     Social History   Tobacco Use   Smoking status: Never   Smokeless tobacco: Never  Substance Use Topics   Alcohol use: No    Alcohol/week: 0.0 standard drinks of alcohol     Current Outpatient Medications:    albuterol (VENTOLIN HFA) 108 (90 Base) MCG/ACT inhaler, SMARTSIG:2 Puff(s) By Mouth Every 6 Hours PRN, Disp: , Rfl:    amLODipine (NORVASC) 2.5 MG tablet, Take 3 tablets (7.5 mg total) by mouth daily., Disp: 270 tablet, Rfl: 3   aspirin EC 81 MG tablet, Take 1 tablet (81 mg total) by mouth daily., Disp: 30 tablet, Rfl: 0   atenolol (TENORMIN) 25 MG tablet, Take 1 tablet by mouth in the morning and at bedtime., Disp: , Rfl:    atorvastatin (LIPITOR) 40 MG tablet, Take 1 tablet (40 mg total) by mouth daily., Disp: 90 tablet, Rfl: 3   Biotin 1 MG CAPS, Take 1 capsule by mouth daily., Disp: 30 capsule, Rfl: 0   buPROPion (WELLBUTRIN XL) 300 MG 24 hr tablet, Take 300 mg by mouth  in the morning., Disp: , Rfl:    Cholecalciferol (VITAMIN D) 2000 UNITS CAPS, Take 1 capsule by mouth daily., Disp: , Rfl:    clobetasol ointment (TEMOVATE) [id number] %, Apply 1 application topically 2 (two) times daily., Disp: , Rfl:    clonazePAM (KLONOPIN) 0.5 MG tablet, Take 0.25 mg by mouth at bedtime., Disp: , Rfl:    clotrimazole-betamethasone (LOTRISONE) cream, Apply 1 application topically 2 (two) times daily., Disp: 45 g, Rfl: 0   cyclobenzaprine (FLEXERIL) 5 MG tablet, Take by mouth., Disp: , Rfl:    diclofenac Sodium (VOLTAREN) 1 % GEL,  Apply topically daily as needed., Disp: , Rfl:    famotidine (PEPCID) 20 MG tablet, Take 20 mg by mouth daily., Disp: , Rfl:    fluticasone (FLONASE) 50 MCG/ACT nasal spray, Place 2 sprays into both nostrils daily., Disp: 48 g, Rfl: 3   gabapentin (NEURONTIN) 300 MG capsule, Take 300 mg by mouth 3 (three) times daily., Disp: , Rfl:    hydroxychloroquine (PLAQUENIL) 200 MG tablet, Take by mouth 2 (two) times daily., Disp: , Rfl:    hydrOXYzine (ATARAX) 10 MG tablet, Take 1 tablet (10 mg total) by mouth 3 (three) times daily as needed., Disp: 30 tablet, Rfl: 0   lamoTRIgine (LAMICTAL) 200 MG tablet, Take 400 mg by mouth at bedtime., Disp: , Rfl:    LINZESS 145 MCG CAPS capsule, Take 145 mcg by mouth daily., Disp: , Rfl:    loratadine (CLARITIN) 10 MG tablet, Take 1 tablet (10 mg total) by mouth daily., Disp: 90 tablet, Rfl: 1   Melatonin 10 MG TABS, Take 1 tablet by mouth at bedtime., Disp: , Rfl:    metFORMIN (GLUCOPHAGE-XR) 500 MG 24 hr tablet, TAKE 1 TABLET(500 MG) BY MOUTH DAILY WITH BREAKFAST, Disp: 90 tablet, Rfl: 1   metoprolol succinate (TOPROL-XL) 25 MG 24 hr tablet, Take 1 tablet (25 mg total) by mouth daily. In place of Atenolol, Disp: 30 tablet, Rfl: 0   mycophenolate (MYFORTIC) 360 MG TBEC EC tablet, Take 720 mg by mouth 2 (two) times daily., Disp: , Rfl:    olopatadine (PATANOL) 0.1 % ophthalmic solution, Place 1 drop into both eyes 2 (two) times daily., Disp: 5 mL, Rfl: 1   omeprazole (PRILOSEC) 40 MG capsule, Take 1 capsule by mouth in the morning and at bedtime. Pulmonologist, Disp: , Rfl:    ondansetron (ZOFRAN) 4 MG tablet, Take 1 tablet (4 mg total) by mouth every 8 (eight) hours as needed for nausea or vomiting., Disp: 20 tablet, Rfl: 0   Polyethylene Glycol 3350 (MIRALAX PO), Take by mouth., Disp: , Rfl:    predniSONE (DELTASONE) 2.5 MG tablet, Take 3 tablets by mouth daily at 12 noon., Disp: , Rfl:    pregabalin (LYRICA) 50 MG capsule, Take 50 mg by mouth 3 (three) times  daily., Disp: , Rfl:    telmisartan (MICARDIS) 80 MG tablet, Take 1 tablet (80 mg total) by mouth daily., Disp: 90 tablet, Rfl: 3   traZODone (DESYREL) 100 MG tablet, Take 150 mg by mouth at bedtime., Disp: , Rfl:    triamcinolone ointment (KENALOG) 0.1 %, , Disp: , Rfl:   Allergies  Allergen Reactions   Ace Inhibitors     Other reaction(s): OTHER Pt states she can not take ace inhibitors. Pt states she can not remember her reaction.    Bee Pollen    Penicillins    Pollen Extract     I personally reviewed active problem list, medication list, allergies,  family history, social history, health maintenance with the patient/caregiver today.   ROS  ***  Objective  There were no vitals filed for this visit.  There is no height or weight on file to calculate BMI.  Physical Exam ***  Recent Results (from the past 2160 hour(s))  Lipid panel     Status: None   Collection Time: 07/12/22  2:09 PM  Result Value Ref Range   Cholesterol 140 <200 mg/dL   HDL 71 > OR = 50 mg/dL   Triglycerides 44 <150 mg/dL   LDL Cholesterol (Calc) 56 mg/dL (calc)    Comment: Reference range: <100 . Desirable range <100 mg/dL for primary prevention;   <70 mg/dL for patients with CHD or diabetic patients  with > or = 2 CHD risk factors. Marland Kitchen LDL-C is now calculated using the Martin-Hopkins  calculation, which is a validated novel method providing  better accuracy than the Friedewald equation in the  estimation of LDL-C.  Cresenciano Genre et al. Annamaria Helling. [id number]): 2061-2068  (http://education.[facility].com/faq/FAQ164)    Total CHOL/HDL Ratio 2.0 <5.0 (calc)   Non-HDL Cholesterol (Calc) 69 <130 mg/dL (calc)    Comment: For patients with diabetes plus 1 major ASCVD risk  factor, treating to a non-HDL-C goal of <100 mg/dL  (LDL-C of <70 mg/dL) is considered a therapeutic  option.   Hemoglobin A1c     Status: Abnormal   Collection Time: 07/12/22  2:09 PM  Result Value Ref Range   Hgb A1c MFr Bld  6.3 (H) <5.7 % of total Hgb    Comment: For someone without known diabetes, a hemoglobin  A1c value between 5.7% and 6.4% is consistent with prediabetes and should be confirmed with a  follow-up test. . For someone with known diabetes, a value <7% indicates that their diabetes is well controlled. A1c targets should be individualized based on duration of diabetes, age, comorbid conditions, and other considerations. . This assay result is consistent with an increased risk of diabetes. . Currently, no consensus exists regarding use of hemoglobin A1c for diagnosis of diabetes for children. .    Mean Plasma Glucose 134 mg/dL   eAG (mmol/L) 7.4 mmol/L  Vitamin B12     Status: None   Collection Time: 07/12/22  2:09 PM  Result Value Ref Range   Vitamin B-12 466 200 - 1,100 pg/mL  POCT urinalysis dipstick     Status: Abnormal   Collection Time: 08/11/22  1:09 PM  Result Value Ref Range   Color, UA drk yellow    Clarity, UA clear    Glucose, UA Negative Negative   Bilirubin, UA neg    Ketones, UA neg    Spec Grav, UA 1.020 1.010 - 1.025   Blood, UA neg    pH, UA 6.0 5.0 - 8.0   Protein, UA Negative Negative   Urobilinogen, UA 0.2 0.2 or 1.0 E.U./dL   Nitrite, UA neg    Leukocytes, UA Small (1+) (A) Negative   Appearance clear    Odor normal   Urine Culture     Status: None   Collection Time: 08/11/22  1:43 PM   Specimen: Urine  Result Value Ref Range   MICRO NUMBER: [id number]    SPECIMEN QUALITY: Adequate    Sample Source URINE    STATUS: FINAL    Result: No Growth     PHQ2/9:    08/23/2022   10:10 AM 08/11/2022    1:02 PM 07/12/2022    1:32 PM 06/21/2022  7:31 AM 06/06/2022   10:24 AM  Depression screen PHQ 2/9  Decreased Interest 1 0 '2 1 1  '$ Down, Depressed, Hopeless 1 0 '2 1 3  '$ PHQ - 2 Score 2 0 '4 2 4  '$ Altered sleeping 1 0 '2 1 1  '$ Tired, decreased energy 1 0 '2 1 3  '$ Change in appetite 1 0 '2 2 3  '$ Feeling bad or failure about yourself  0 0 '2 1 3  '$ Trouble  concentrating 0 0 2 1 0  Moving slowly or fidgety/restless 0 0 1 0 0  Suicidal thoughts 0 0 0 0 0  PHQ-9 Score 5 0 '15 8 14  '$ Difficult doing work/chores Somewhat difficult Not difficult at all Somewhat difficult Somewhat difficult     phq 9 is {gen pos [id number]   Fall Risk:    08/23/2022   10:09 AM 08/11/2022    1:02 PM 07/12/2022    1:28 PM 06/06/2022   10:24 AM 04/15/2022    9:19 AM  Fall Risk   Falls in the past year? 0 0 0 0 0  Number falls in past yr: 0 0  0 0  Injury with Fall? 0 0  0 0  Risk for fall due to : No Fall Risks  No Fall Risks No Fall Risks No Fall Risks  Follow up Falls prevention discussed;Education provided;Falls evaluation completed  Falls prevention discussed;Education provided;Falls evaluation completed Falls prevention discussed Falls prevention discussed      Functional Status Survey:      Assessment & Plan  *** There are no diagnoses linked to this encounter.

## 2022-10-10 ENCOUNTER — Ambulatory Visit: Payer: 59 | Admitting: Family Medicine

## 2022-10-10 ENCOUNTER — Telehealth: Admit: 2022-10-10 | Discharge: 2022-10-11 | Payer: MEDICARE | Attending: Psychologist | Primary: Psychologist

## 2022-10-10 DIAGNOSIS — F4310 Post-traumatic stress disorder, unspecified: Principal | ICD-10-CM

## 2022-10-10 DIAGNOSIS — F410 Panic disorder [episodic paroxysmal anxiety] without agoraphobia: Principal | ICD-10-CM

## 2022-10-10 DIAGNOSIS — F331 Major depressive disorder, recurrent, moderate: Principal | ICD-10-CM

## 2022-10-10 DIAGNOSIS — F411 Generalized anxiety disorder: Principal | ICD-10-CM

## 2022-10-10 NOTE — Unmapped (Signed)
[facility]   Confidential Psychological Therapy Session      Patient Name: MYEASHA HOOPES  Medical Record Number: [id number]  Date of Service: October 10, 2022  Attending Psychologist: Caroline More, PhD  CPT Procedure Codes: 81191 for 45 mins of face to face counseling    This visit was performed face to face with interactive technology using a HIPPA compliant audio/visual platform. We reviewed confidentiality today. The patient was present in [place], a state in which this provider is licensed and able to provide care (location and contact information confirmed), attended this visit alone, and consented to this virtual pain psychology visit.    REFERRING PHYSICIAN: Clarene Essex, MD    CHIEF COMPLAINT AND REASON FOR VISIT: pain coping skills, CBT to address depression and anxiety in the setting of chronic pain    SUBJECTIVE / HISTORY OF PRESENT ILLNESS: Ms.  Munafo is a very pleasant 67 y.o.  female from [place], [place] with multiple chronic pain complaints related to fibromyalgia and lupus who initially met with me in October 2016, and which time she was diagnosed with severe depression, PTSD, panic disorder, and generalized anxiety.The patient returns for a therapy session today. Last follow up with me was in 09/12/2022.    The patient participates actively today.  Patient has been feeling overwhelmed about the insurance contract negotiations and how this will impact her care.  The patient has specialist throughout the [facility] system, including pain management, pain psychology, cardiology, ENT and pulmonary medicine.  Patient has now confirmed that Francine Graven is an option that is excepted at each of her [facility] providers offices.  She is not sure if she can change insurances midcycle, but she believes she can.  She has called [employer] a number of times and has been on hold.  She has left a number of messages.  She has not heard anything back.  Today we reviewed problem solving, and the patient is going to try to get some clarity and look into changing insurance.  She can access back to physiological sensations with her body and notes she feels more relaxed and calm.  She is able to identify numerous worry thoughts and how this contributes to feeling anxious and tight muscles throughout her body.  We processed recent stressors utilizing a mindfulness approach.      OBJECTIVE / MENTAL STATUS:    Appearance:   Appears stated age and Clean/Neat   Motor:  No abnormal movements   Speech/Language:   Normal rate, volume, tone, fluency   Mood:  Depressed and Anxious   Affect:  euthymic   Thought process:  Logical, linear, clear, coherent, goal directed   Thought content:    Denies SI, HI, self harm, delusions, obsessions, paranoid ideation, or ideas of reference   Perceptual disturbances:    Denies auditory and visual hallucinations, behavior not concerning for response to internal stimuli   Orientation:  Oriented to person, place, time, and general circumstances   Attention:  Able to fully attend without fluctuations in consciousness   Concentration:  Able to fully concentrate and attend   Memory:  Immediate, short-term, long-term, and recall grossly intact    Fund of knowledge:   Consistent with level of education and development   Insight:    Fair   Judgment:   Intact   Impulse Control:  Intact     DIAGNOSTIC IMPRESSION:   Post Traumatic Stress Disorder (PTSD)  Generalized Anxiety Disorder (GAD)  Panic Disorder  Major Depressive  Disorder, moderate, recurrent  Chronic pain syndrome  Fibromyalgia  Lupus    ASSESSMENT:   Ms.  Gwathney is a very pleasant 67 y.o.  female from [place], [place] with multiple chronic pain complaints related to lupus, arthritis, and fibromyalgia. She was previously seen in our clinic from 2012-2014, and reestablished care with Dr. Fayrene Fearing in August 2016. The patient has struggled with depression and anxiety, but is very motivated to participate in intensive, multidisciplinary treatment to address the connection between depression, anxiety and pain. She has a long-standing history of depression and anxiety, and has worked with outpatient psychiatrist and therapist over the years. She is currently established with [facility] psychiatry.  In general, depression, anxiety, pain coping and panic have all improved (until recent bout with pulmonary issues), but the patient evidences a seasonal affective pattern of mood changes typically her mood tends to be worse during the winter.      PLAN:   (1) Psychotherapy - Continue CBT. CBT will be used to address PTSD, MDD, Panic D/o, GAD, and chronic pain.     --Behavioral Activation - Encouraged behavioral activation.  Is trying.  --Downloaded and uses Insight Timer, guided relaxation app, for nightly practice.  --Continues to utilize diaphragmatic breathing daily  --encouraged early morning sunlight / light therapy for possible SAD in the setting of chronic MDD    (2) Psychiatry - Pt currently established with [facility].  Is followed by Dr. Evette Cristal (attending) but typically has appointments with a psychiatrist resident.    (3) Safety - Pt denies any current or recent SI or safety concerns. Knows to call 911 or go to her local ED. Also previously given [facility].      (4) follow-up - with me in 1 month

## 2022-10-11 NOTE — Progress Notes (Unsigned)
Name: Nicole Ballard   MRN: [id number]    DOB: 1956/07/26   Date:10/12/2022       Progress Note  Subjective  Chief Complaint  Follow Up  HPI  HTN:   Continue Atenolol 25 BID ,  Norvasc 7.5 mg and Telmisartan 80 mg , she states bp has been well controlled at home lately, 120's/70's She has chronic sob and multifactorial, she has intermittent palpitation but stable.    INTERPRETATION ( done 02/2019 )  Normal Stress Echocardiogram NORMAL RIGHT VENTRICULAR SYSTOLIC FUNCTION TRIVIAL REGURGITATION NOTED  NO VALVULAR STENOSIS NOTED   Aorta Atherosclerosis: she is on statin therapy. Reviewed CT report from 08/2022 with patient .  History of DM: denies polyphagia, polyuria or polydipsia A1C is trending up again , last level 6.3 % .  She is on metformin due to gradual weight gain, discussed importance of cutting down on carbohydrates    Chronic Recurrent MDD /Mild Neurocognitive Impairment: under the care of psychiatrist  at [facility] psychiatry, she is taking Effexor 100  mg, Lamictal, Wellbutrin 300 mg now, and Klonopin qhs. She is doing better with her psychiatrist . She also has a therapist. She states right now she is not doing well, worried about [facility] not accepting her insurance    Dyslipidemia: reviewed last labs, taking Atorvastatin and denies side effects. LDL 56 , continue current regiment     Post-inflammatory pulmonary fibrosis/Asthma? Lupus involvement of lung : under the care of pulmonologist at [facility], she has occasional cough she continues to have SOB - likely multifactorial. She is on PPI and also uses Flovent    Lupus involvement lungs/Immunosuppression  she is seeing Rheumatologist , taking plaquenil and prednisone daily, she still has some SOB. She states symptoms have been stable.    Obesity : her BMI was down to 29.94 but gradually went up and reached 35 but is down again , continue life style modifications and Metformin    GERD: she She is on PPI and pepcid prn, sates symptoms are  controlled . Seen by GI Feb 2023  Chronic constipation: she was seen by Dr. Rosana Berger in Dec with abdominal pain, CT showed a lot of stool burden, she states taking miralax every other day and has diarrhea, explain she needs to take one cup full twice daily    Vertigo: she still has intermittent symptoms takes Meclizine and also zofran from chronic nausea . She states  symptoms are stable and recurrent. She is under the care of Dr. Darnelle Going at [facility].   Neurogenic claudication: symptoms are stable, aching when she walks , now she is off gabapentin and taking lyrica , she still has pain   Chronic nausea: taking zofran prn and controls symptoms, she has a gastroenterologist  Unchanged    Empty Sella: last MRI was 2021  no longer showed pituitary adenoma. Seen by neurosurgeon in the past but now released She states headache is intermittent and not as severe. Repeat MRI reviewed  Chronic constipation: she is skipping doses of Linzess, she states daily causes diarrhea, but if she does not take it can go a whole week without bowel movements, advised to try every other day, she is afraid to take it when out of the house   Chronic pain from/Neurogenic claudication Lupus and FMS: going to pain clinic, she states her back pain is intense and sometimes, she also has knee pain, muscle aches. She is taking Lyrica three  daily..   Eczema: she has medication given by Dermatologist, no  rashes, but she is  itchy lately   Patient Active Problem List   Diagnosis Date Noted   Asthma, chronic, severe persistent, uncomplicated 80/16/5537   Other vascular myelopathies ([place]) 10/12/2022   Interstitial lung disease ([place]) 07/12/2022   Empty sella ([place]) 07/12/2022   History of craniotomy 07/12/2022   Immunocompromised ([place]) 04/29/2021   Hyperglycemia 10/23/2017   Vertigo 09/29/2016   Bronchiolitis obliterans organizing pneumonia ([place]) 09/29/2016   Neck pain, musculoskeletal 12/15/2015   Cough in adult 09/11/2015    Abnormal CAT scan 04/11/2015   Auditory impairment 04/11/2015   Insomnia, persistent 04/11/2015   Chronic kidney disease (CKD), stage I 04/11/2015   Chronic nonmalignant pain 04/11/2015   Chronic constipation 04/11/2015   Dyslipidemia 04/11/2015   Fibromyalgia 04/11/2015   Gastro-esophageal reflux disease without esophagitis 04/11/2015   Dysphonia 04/11/2015   Low back pain 04/11/2015   Eczema intertrigo 04/11/2015   Keratosis pilaris 04/11/2015   Chronic recurrent major depressive disorder ([place]) 48/27/0786   Dysmetabolic syndrome 75/44/9201   Neurogenic claudication 04/11/2015   Perennial allergic rhinitis with seasonal variation 04/11/2015   Abnormal presence of protein in urine 04/11/2015   Seborrhea capitis 04/11/2015   Moderate dysplasia of vulva 04/11/2015   Vitamin D deficiency 04/11/2015   Spinal stenosis of lumbar region 04/11/2015   Treadmill stress test negative for angina pectoris 03/05/2015   Shortness of breath 05/20/2014   Morbid obesity ([place]) 02/03/2014   Asthma, chronic 12/31/2013   H/O total knee replacement 12/31/2013   Episcleritis 09/26/2013   Vocal cord dysfunction 05/28/2013   Anxiety, generalized 03/19/2013   Back pain 12/18/2012   Pulmonary nodules 11/26/2012   Lung nodule, multiple 11/26/2012   Arthritis, degenerative 11/01/2012   H/O aspiration pneumonitis 10/22/2012   Postinflammatory pulmonary fibrosis ([place]) 10/22/2012   Mixed incontinence 05/04/2012   Lung involvement in systemic lupus erythematosus ([place]) 11/05/2010   Chronic nausea 07/01/2008   Benign hypertension 01/25/2005    Past Surgical History:  Procedure Laterality Date   BRAIN SURGERY     CHOLECYSTECTOMY     VAGINAL HYSTERECTOMY      Family History  Problem Relation Age of Onset   Breast cancer Mother    Cancer Mother 51       Breast   Cancer Father 21       Stomach   Breast cancer Maternal Aunt     Social History   Tobacco Use   Smoking status: Never   Smokeless  tobacco: Never  Substance Use Topics   Alcohol use: No    Alcohol/week: 0.0 standard drinks of alcohol     Current Outpatient Medications:    albuterol (VENTOLIN HFA) 108 (90 Base) MCG/ACT inhaler, SMARTSIG:2 Puff(s) By Mouth Every 6 Hours PRN, Disp: , Rfl:    amLODipine (NORVASC) 2.5 MG tablet, Take 3 tablets (7.5 mg total) by mouth daily., Disp: 270 tablet, Rfl: 3   aspirin EC 81 MG tablet, Take 1 tablet (81 mg total) by mouth daily., Disp: 30 tablet, Rfl: 0   atenolol (TENORMIN) 25 MG tablet, Take 1 tablet by mouth in the morning and at bedtime., Disp: , Rfl:    atorvastatin (LIPITOR) 40 MG tablet, Take 1 tablet (40 mg total) by mouth daily., Disp: 90 tablet, Rfl: 3   Biotin 1 MG CAPS, Take 1 capsule by mouth daily., Disp: 30 capsule, Rfl: 0   buPROPion (WELLBUTRIN XL) 300 MG 24 hr tablet, Take 300 mg by mouth in the morning., Disp: , Rfl:    Cholecalciferol (VITAMIN  D) 2000 UNITS CAPS, Take 1 capsule by mouth daily., Disp: , Rfl:    clobetasol ointment (TEMOVATE) 8.52 %, Apply 1 application topically 2 (two) times daily., Disp: , Rfl:    clonazePAM (KLONOPIN) 0.5 MG tablet, Take 0.25 mg by mouth at bedtime., Disp: , Rfl:    clotrimazole-betamethasone (LOTRISONE) cream, Apply 1 application topically 2 (two) times daily., Disp: 45 g, Rfl: 0   cyclobenzaprine (FLEXERIL) 5 MG tablet, Take by mouth., Disp: , Rfl:    diclofenac Sodium (VOLTAREN) 1 % GEL, Apply topically daily as needed., Disp: , Rfl:    fluticasone (FLONASE) 50 MCG/ACT nasal spray, Place 2 sprays into both nostrils daily., Disp: 48 g, Rfl: 3   hydroxychloroquine (PLAQUENIL) 200 MG tablet, Take by mouth 2 (two) times daily., Disp: , Rfl:    hydrOXYzine (ATARAX) 10 MG tablet, Take 1 tablet (10 mg total) by mouth 3 (three) times daily as needed., Disp: 30 tablet, Rfl: 0   lamoTRIgine (LAMICTAL) 200 MG tablet, Take 400 mg by mouth at bedtime., Disp: , Rfl:    LINZESS 145 MCG CAPS capsule, Take 145 mcg by mouth daily., Disp: , Rfl:     loratadine (CLARITIN) 10 MG tablet, Take 1 tablet (10 mg total) by mouth daily., Disp: 90 tablet, Rfl: 1   Melatonin 10 MG TABS, Take 1 tablet by mouth at bedtime., Disp: , Rfl:    metFORMIN (GLUCOPHAGE-XR) 500 MG 24 hr tablet, TAKE 1 TABLET(500 MG) BY MOUTH DAILY WITH BREAKFAST, Disp: 90 tablet, Rfl: 1   metoprolol succinate (TOPROL-XL) 25 MG 24 hr tablet, Take 1 tablet (25 mg total) by mouth daily. In place of Atenolol, Disp: 30 tablet, Rfl: 0   mycophenolate (MYFORTIC) 360 MG TBEC EC tablet, Take 720 mg by mouth 2 (two) times daily., Disp: , Rfl:    olopatadine (PATANOL) 0.1 % ophthalmic solution, Place 1 drop into both eyes 2 (two) times daily., Disp: 5 mL, Rfl: 1   omeprazole (PRILOSEC) 40 MG capsule, Take 1 capsule by mouth in the morning and at bedtime. Pulmonologist, Disp: , Rfl:    ondansetron (ZOFRAN) 4 MG tablet, Take 1 tablet (4 mg total) by mouth every 8 (eight) hours as needed for nausea or vomiting., Disp: 20 tablet, Rfl: 0   Polyethylene Glycol 3350 (MIRALAX PO), Take by mouth., Disp: , Rfl:    predniSONE (DELTASONE) 2.5 MG tablet, Take 3 tablets by mouth daily at 12 noon., Disp: , Rfl:    pregabalin (LYRICA) 50 MG capsule, Take 50 mg by mouth 3 (three) times daily., Disp: , Rfl:    telmisartan (MICARDIS) 80 MG tablet, Take 1 tablet (80 mg total) by mouth daily., Disp: 90 tablet, Rfl: 3   traZODone (DESYREL) 100 MG tablet, Take 150 mg by mouth at bedtime., Disp: , Rfl:    triamcinolone ointment (KENALOG) 0.1 %, , Disp: , Rfl:    famotidine (PEPCID) 20 MG tablet, Take 20 mg by mouth daily., Disp: , Rfl:    gabapentin (NEURONTIN) 300 MG capsule, Take 300 mg by mouth 3 (three) times daily. (Patient not taking: Reported on 10/12/2022), Disp: , Rfl:   Allergies  Allergen Reactions   Ace Inhibitors     Other reaction(s): OTHER Pt states she can not take ace inhibitors. Pt states she can not remember her reaction.    Bee Pollen    Penicillins    Pollen Extract     I personally  reviewed active problem list, medication list, allergies, family history, social history, health  maintenance with the patient/caregiver today.   ROS  Constitutional: Negative for fever or weight change.  Respiratory: Negative for cough and shortness of breath.   Cardiovascular: Negative for chest pain or palpitations.  Gastrointestinal: Negative for abdominal pain, no bowel changes.  Musculoskeletal: Negative for gait problem or joint swelling.  Skin: Negative for rash.  Neurological: Negative for dizziness or headache.  No other specific complaints in a complete review of systems (except as listed in HPI above).   Objective  Vitals:   10/12/22 0920  BP: 126/78  Pulse: 70  Resp: 16  Temp: 98.5 F (36.9 C)  TempSrc: Oral  SpO2: 98%  Weight: 219 lb 12.8 oz (99.7 kg)  Height: '5\' 7"'$  (1.702 m)    Body mass index is 34.43 kg/m.  Physical Exam  Constitutional: Patient appears well-developed and well-nourished. Obese  No distress.  HEENT: head atraumatic, normocephalic, pupils equal and reactive to light, neck supple Cardiovascular: Normal rate, regular rhythm and normal heart sounds.  No murmur heard. No BLE edema. Pulmonary/Chest: Effort normal and breath sounds normal. No respiratory distress. Abdominal: Soft.  There is no tenderness. Psychiatric: Patient has a normal mood and affect. behavior is normal. Judgment and thought content normal.   Recent Results (from the past 2160 hour(s))  POCT urinalysis dipstick     Status: Abnormal   Collection Time: 08/11/22  1:09 PM  Result Value Ref Range   Color, UA drk yellow    Clarity, UA clear    Glucose, UA Negative Negative   Bilirubin, UA neg    Ketones, UA neg    Spec Grav, UA 1.020 1.010 - 1.025   Blood, UA neg    pH, UA 6.0 5.0 - 8.0   Protein, UA Negative Negative   Urobilinogen, UA 0.2 0.2 or 1.0 E.U./dL   Nitrite, UA neg    Leukocytes, UA Small (1+) (A) Negative   Appearance clear    Odor normal   Urine Culture      Status: None   Collection Time: 08/11/22  1:43 PM   Specimen: Urine  Result Value Ref Range   MICRO NUMBER: [id number]    SPECIMEN QUALITY: Adequate    Sample Source URINE    STATUS: FINAL    Result: No Growth     PHQ2/9:    10/12/2022    9:29 AM 08/23/2022   10:10 AM 08/11/2022    1:02 PM 07/12/2022    1:32 PM 06/21/2022    7:31 AM  Depression screen PHQ 2/9  Decreased Interest 2 1 0 2 1  Down, Depressed, Hopeless 2 1 0 2 1  PHQ - 2 Score 4 2 0 4 2  Altered sleeping 0 1 0 2 1  Tired, decreased energy 3 1 0 2 1  Change in appetite 3 1 0 2 2  Feeling bad or failure about yourself  2 0 0 2 1  Trouble concentrating 1 0 0 2 1  Moving slowly or fidgety/restless 0 0 0 1 0  Suicidal thoughts 0 0 0 0 0  PHQ-9 Score 13 5 0 15 8  Difficult doing work/chores Somewhat difficult Somewhat difficult Not difficult at all Somewhat difficult Somewhat difficult    phq 9 is positive   Fall Risk:    10/12/2022    9:29 AM 08/23/2022   10:09 AM 08/11/2022    1:02 PM 07/12/2022    1:28 PM 06/06/2022   10:24 AM  Fall Risk   Falls in the past year?  0 0 0 0 0  Number falls in past yr:  0 0  0  Injury with Fall?  0 0  0  Risk for fall due to : No Fall Risks No Fall Risks  No Fall Risks No Fall Risks  Follow up Falls prevention discussed;Education provided;Falls evaluation completed Falls prevention discussed;Education provided;Falls evaluation completed  Falls prevention discussed;Education provided;Falls evaluation completed Falls prevention discussed      Functional Status Survey: Is the patient deaf or have difficulty hearing?: Yes Does the patient have difficulty seeing, even when wearing glasses/contacts?: No Does the patient have difficulty concentrating, remembering, or making decisions?: Yes Does the patient have difficulty walking or climbing stairs?: Yes Does the patient have difficulty dressing or bathing?: No Does the patient have difficulty doing errands alone such as visiting a  doctor's office or shopping?: No    Assessment & Plan  1. Lung involvement in systemic lupus erythematosus (HCC)  Stable   2. Asthma, chronic, severe persistent, uncomplicated  On inhalers   3. Chronic recurrent major depressive disorder ([place])  Under the care of psychiatrist   4. Immunocompromised ([place])  On plaquenil and prednisone   5. Postinflammatory pulmonary fibrosis ([place])  Sees pulmonologist and Rheumatologist at [facility]  6. Empty sella ([place])  Stable, reviewed MRI, under the care of neurosurgeon   7. Atherosclerosis of aorta ([place])  On statin therapy   8. Breast cancer screening by mammogram  - MM 3D SCREEN BREAST BILATERAL; Future  9. Chronic constipation  Take Miralax BID  10. Neurogenic claudication   11. Benign hypertension   12. Dyslipidemia

## 2022-10-12 ENCOUNTER — Ambulatory Visit (INDEPENDENT_AMBULATORY_CARE_PROVIDER_SITE_OTHER): Payer: 59 | Admitting: Family Medicine

## 2022-10-12 ENCOUNTER — Encounter: Payer: Self-pay | Admitting: Family Medicine

## 2022-10-12 VITALS — BP 126/78 | HR 70 | Temp 98.5°F | Resp 16 | Ht 67.0 in | Wt 219.8 lb

## 2022-10-12 DIAGNOSIS — K5909 Other constipation: Secondary | ICD-10-CM | POA: Diagnosis not present

## 2022-10-12 DIAGNOSIS — I700 Atherosclerosis of aorta: Secondary | ICD-10-CM | POA: Diagnosis not present

## 2022-10-12 DIAGNOSIS — F339 Major depressive disorder, recurrent, unspecified: Secondary | ICD-10-CM

## 2022-10-12 DIAGNOSIS — D849 Immunodeficiency, unspecified: Secondary | ICD-10-CM

## 2022-10-12 DIAGNOSIS — I10 Essential (primary) hypertension: Secondary | ICD-10-CM

## 2022-10-12 DIAGNOSIS — J8410 Pulmonary fibrosis, unspecified: Secondary | ICD-10-CM | POA: Diagnosis not present

## 2022-10-12 DIAGNOSIS — M3213 Lung involvement in systemic lupus erythematosus: Secondary | ICD-10-CM

## 2022-10-12 DIAGNOSIS — E785 Hyperlipidemia, unspecified: Secondary | ICD-10-CM

## 2022-10-12 DIAGNOSIS — G9519 Other vascular myelopathies: Secondary | ICD-10-CM | POA: Insufficient documentation

## 2022-10-12 DIAGNOSIS — E236 Other disorders of pituitary gland: Secondary | ICD-10-CM

## 2022-10-12 DIAGNOSIS — R29818 Other symptoms and signs involving the nervous system: Secondary | ICD-10-CM

## 2022-10-12 DIAGNOSIS — Z1231 Encounter for screening mammogram for malignant neoplasm of breast: Secondary | ICD-10-CM

## 2022-10-12 DIAGNOSIS — J4550 Severe persistent asthma, uncomplicated: Secondary | ICD-10-CM

## 2022-10-12 MED ORDER — POLYETHYLENE GLYCOL 3350 17 GM/SCOOP PO POWD: 17.0000 g | Freq: Two times a day (BID) | ORAL | 1 refills | Status: DC | PRN

## 2022-10-13 ENCOUNTER — Encounter: Payer: Self-pay | Admitting: Family Medicine

## 2022-10-17 ENCOUNTER — Encounter: Payer: Self-pay | Admitting: Family Medicine

## 2022-10-17 NOTE — Unmapped (Signed)
Pulmonary Clinic - Follow-up Visit      HISTORY:     Active Pulmonary Problems & Brief History:  Caitlin Smith is a 67 y.o. woman with many medical issues here for follow up of follow up for organizing pneumonia.     Interval History:  - 2016 and prior - patient followed at [facility]. They thought her symptoms of dyspnea were from obesity and asthma and recommended ENT evaluation. They also stated concern for chronic aspiration.   - 04/2016 - patient in [facility] system. She was thought to have multifactorial dyspnea, waxing and waning pulmonary opacities. Evaluated by pulmonary hypertension team without evidence of pulmonary hypertension. Underwent bronchoscopy with BAL and transbronchial biopsies which showed significant inflammation. DDX remained broad - CEP, COP, SLE pneumonitis, ABPA, EGPA, AEP, but it was decided to treat her with prolonged steroid taper.   - 08/23/16 - last visit with Dr. Johnnette Barrios - was improving with steroid taper   - 10/20/16 - established with Dr. Nilsa Nutting - there had been question of compliance with her prednisone since last visit, but by time of visit she had been on it consistently for one month. No clear improvement in dyspnea, but remains inactive.   - 02/09/17 - follow up - spirometry showed improvement in FVC and DLCOafter 2 months of steroid, had tapered off by time of visit. When she stopped the prednisone she developed increased cough, chest tightness, dyspnea.   - 03/27/17 - follow up - given steroid dependence was placed on MMF.   - 10/16/17 - follow up - dose reduced MMF for GI side effects. Prolonged steroid taper over this time.   - 02/06/2019 - last visit with pulmonary, Dr. Judeth Horn - at that time had gone to local ED for chest pain, CT chest showed interstitial lung disease in the lung bases, favored to reflect mild progression of chronic fibrotic nonspecific interstitial pneumonia (NSIP). It was unclear to Dr. Judeth Horn if that represented actual progression of her underlying disease.   - 03/03/21 - patient reports she has dyspnea on exertion not associated with chest pain. Not currently using any inhalers. Still has some chronic cough, but ENT thinks she's having reflux so has her on PPI + H2 blocker. Cough non-productive. No F/C/NS, N/V/D, upset tummy. Sleeps laying flat, no nocturnal awakenings. Reports negative sleep studies for OSA. No LE edema. Wanted to repeat PFTs in 4 months given mildly reduced compared to prior. If stable/improved at next visit then would reach out to rheum to see about weaning MMF.   - 06/08/21 - ENT - larynx healthy on laryngoscopy. Referred to GI for GI discomfort and constipation.   - 07/04/21 - follow up - had been increasing her exercise up to 50 minutes daily until 2 weeks prior to visit when breathing worsened again, now having dyspnea with minimal exertion. Sent for CT chest, , chest fluoroscopy. CT chest slightly worse but not significantly different, TTE without obvious pathology to cause symptoms. Chest fluoroscopy with elevated left hemidiaphragm and diminished excursion compared to right but not fully paralyzed, without hypoxemia.   - 09/13/20 - follow up - had URI over Christmas and New Year treat with doxycycline and followed with lingering hoarseness, productive cough. No F/C, some DOE, night sweats. Increased MMF.   - 12/29/21 - follow up - very easily dyspneic, even just walking to the car or climbing 12 stairs into house. Less active overall. Not as much coughing, allergies are improved. No heartburn with acid reflux medication.   -  02/16/22 - phone visit - PFTs improved from April, continued pred 10mg  daily, MMF 720 BID, HCQ 200mg  BID.   - 04/20/22 - follow up - some palpitations, some dyspnea, some dry cough associated with nausea and dizziness. No clubbing or LE edema. MMF 720mg  BID, HCQ 200mg  BID, prednisone 7.5mg  daily.   - 06/09/22 - ENT - PPI ineffective for cough.Trial of right SLN injection for neurogenic cough.  - 09/12/21 - had been doing pulmonary rehab in [place] which she has now finished and is exercising 5 days per week at home, walking an hour a day. Is having a lot of coughing which reduces appetite. Occurs throughout day but worst at night. No sputum. No change in congestion or sinus complaints to explain cough. No change to MMF, HCQ, prednisone   - 10/19/22 - follow up - still exercising most days of the week, doing walking, feeling good, only shortness of breath with climbing stairs. Very little cough since injection into SLN with ENT. Very concerned about [employer] and its inability to form a working relationship [facility] as all her doctors are here. She is looking at alternative insurance plans.     Past Medical History: The medical and surgical history were personally reviewed and updated in the patient's electronic medical record. Pertinent positives are documented above.  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 Pertinent positives are documented above.  Home Medications: Medications were reviewed and updated in the patient's electronic medical record. Pertinent positives are documented above.   Allergies: Allergies were reviewed and updated in the patient's electronic medical record. Pertinent positives are documented above.  Review of Systems: A comprehensive review of systems was completed and negative except as noted in HPI.    PHYSICAL EXAM:     Vitals:    10/19/22 1042   BP: 141/78   Pulse: 64   Temp: 36.3 ??C (97.3 ??F)   SpO2: 98%     General: pleasant individual appearing stated age, alert and oriented, no acute distress  HEENT: trachea midline, supple  CV: RRR, systolic ejection murmur.   Lungs: CTAB, good air movement throughout   Abd: Soft, NT/ND, no rebound or guarding  Ext: Warm, well perfused, no peripheral edema  Skin: No rashes, skin breakdown, or wounds  Neuro: CN II-XII intact to conversation, alert and oriented.     LABORATORY and RADIOLOGY DATA:     Pulmonary Function Tests/Interpretation:  10/19/2022       08/03/22      Pertinent Laboratory Data:  02/08/22 - Hg 13.4, absolute lymphocyte 3.8    04/25/16 BAL and TBBX   Cultures negative except for OPF     Cell count  PMN 31%  Lymph 11%  Macs 43%  Eos 7%  Baso 6%     Pathology   A: Lung, right lower lobe, endobronchial biopsy   - DIP-like reaction pattern with admixed eosinophils, consistent with eosinophilic pneumonia (see  Micro Exam)  - Patchy organizing pneumonia  - Interstitial lymphoplasmacytic infiltrate, polytypic  - Partially denuded benign respiratory mucosa, without viral cytopathic effect  - No malignant neoplasm identified   - GMS and  AFB stains negative for fungi or AFB    Cytology  Lung, Bronchial lavage:  - No malignant cells identified.   - Alveolar macrophages and mixed inflammation.     Pertinent Imaging Data:  09/15/2022 Barium Swallow Double Contrast   Esophageal dysmotility.   Gastroesophageal reflux.   Elevation of the left hemidiaphragm.       06/29/22 CXR  Hypoinflated lungs with no acute abnormalities.     08/02/21 HRCT Chest  Slight worsening of fibrotic changes related to  interstitial lung disease associated with connective tissue disease.       Resolution of right lower lobe consolidation with central clearance consistent with organizing pneumonia.     08/26/21 Sniff Test  Elevated left hemidiaphragm with slightly diminished excursion relative to the right but no diaphragmatic paralysis.    Pertinent Cardiac Data:  05/26/22 Zio Patient had a min HR of 52 bpm, max HR of 123 bpm, and avg HR of 67 bpm. Predominant underlying rhythm was Sinus Rhythm. Isolated SVEs were rare (<1.0%), and no SVE Couplets or SVE Triplets were present. Isolated VEs were rare (<1.0%), and no VE Couplets or VE Triplets were present.   Symptoms were associated with sinus rhythm and also isolated VEs.     08/27/21 TTE     1. The left ventricular systolic function is normal, LVEF is visually estimated at 60-65%.    2. The right ventricle is normal in size, with normal systolic function.    3. The pulmonary systolic pressure cannot be estimated due to insufficient TR signal.      ASSESSMENT and PLAN     Problem List  Connective Tissue Disease related Interstitial Lung Disease (CTD-ILD)  Cryptogenic Organizing Pneumonia (COP)  SLE   BMI 37   Left hemidiaphragm elevation  Esophageal dysmotility with GERD    Assessment  PFTs and dyspnea has stabilized on prednisone 7.5mg  daily, MMF 720mg  BID, and HCQ 200mg  BID. Drop prednisone to 5mg  daily today, off at next visit if doing well. PFTs in 6 months.     Wonder if she has component of shrinking lung syndrome based on restriction on PFTs, high diaphragms, and lack of extensive fibrosis on her CT. This would be treated with immunosuppression regardless.     Plan  - Continue mycophenolate 720mg  BID, HCQ 200mg  BID per rheumatology  - Drop prednisone to 5mg  daily    The patient was seen with Dr. Doretha Sou and will return to clinic in 3 months.    Layla Maw MD  Pulmonary and Critical Care Fellow

## 2022-10-17 NOTE — Unmapped (Signed)
Pt last seen for constipation/pelvic floor dyssynergia in 07/2021 with recommended 3 month follow-up. Pt called and left VM to request guidance, stating that a recent CT showed constipation and Linzess is not helping much.  Reviewed EMR and CTAP (08/24/22 at [facility]).    Called pt. No answer. Voicemail full. Sent MyChart.  She responded to MyChart and called.  Pt states she's taking:    Linzzes daily  Miralax 1 cap (17g) daily  Senna prn  Warm prune juice prn    Pt has BM on this regimen but feels like she is not completely emptying. Reports Bristol Type 1 stool. Pt did not seek pelvic floor PT after Clinic visit 07/2021 but practiced the exercises she had previously learned (pt mentions massaging her abdomen). Advised pt that PFPT may be a bit different with exercises targeted at strengthening her perineal/ pelvic floor muscles, coordination of the muscles used to defecate. Pt states she's willing consider this if Nicholos Johns recommends  Advised pt to make a follow-up clinic visit.   Advised her to take Miralax up to 3 doses daily along with current regimen.  Pt stated understanding.

## 2022-10-18 DIAGNOSIS — K5904 Chronic idiopathic constipation: Principal | ICD-10-CM

## 2022-10-18 DIAGNOSIS — M6289 Other specified disorders of muscle: Principal | ICD-10-CM

## 2022-10-18 MED ORDER — CLONAZEPAM 0.5 MG TABLET
ORAL_TABLET | Freq: Three times a day (TID) | ORAL | 1 refills | 20 days | Status: CP | PRN
Start: 2022-10-18 — End: ?

## 2022-10-18 MED ORDER — LINZESS 290 MCG CAPSULE
ORAL_CAPSULE | Freq: Every day | ORAL | 5 refills | 30 days | Status: CP
Start: 2022-10-18 — End: ?

## 2022-10-19 ENCOUNTER — Ambulatory Visit: Admit: 2022-10-19 | Discharge: 2022-10-19 | Payer: MEDICARE

## 2022-10-19 DIAGNOSIS — J8489 Other specified interstitial pulmonary diseases: Secondary | ICD-10-CM | POA: Diagnosis not present

## 2022-10-19 DIAGNOSIS — M329 Systemic lupus erythematosus, unspecified: Secondary | ICD-10-CM | POA: Diagnosis not present

## 2022-10-19 DIAGNOSIS — Z09 Encounter for follow-up examination after completed treatment for conditions other than malignant neoplasm: Secondary | ICD-10-CM | POA: Diagnosis not present

## 2022-10-19 DIAGNOSIS — J8410 Pulmonary fibrosis, unspecified: Secondary | ICD-10-CM | POA: Diagnosis not present

## 2022-10-19 DIAGNOSIS — M797 Fibromyalgia: Secondary | ICD-10-CM | POA: Diagnosis not present

## 2022-10-19 DIAGNOSIS — I10 Essential (primary) hypertension: Secondary | ICD-10-CM | POA: Diagnosis not present

## 2022-10-19 DIAGNOSIS — K219 Gastro-esophageal reflux disease without esophagitis: Secondary | ICD-10-CM | POA: Diagnosis not present

## 2022-10-19 DIAGNOSIS — J849 Interstitial pulmonary disease, unspecified: Secondary | ICD-10-CM | POA: Diagnosis not present

## 2022-10-19 DIAGNOSIS — Z7952 Long term (current) use of systemic steroids: Secondary | ICD-10-CM | POA: Diagnosis not present

## 2022-10-19 MED ORDER — PREDNISONE 2.5 MG TABLET
ORAL_TABLET | Freq: Every day | ORAL | 3 refills | 90 days | Status: CP
Start: 2022-10-19 — End: 2023-10-14

## 2022-10-19 NOTE — Unmapped (Signed)
I saw and evaluated the patient, participating in the key portions of the service.  I reviewed the resident???s note.  I agree with the resident???s findings and plan.     Briefly, Caitlin Smith presents for follow up for CTD-ILD. Doing well on myfortic and plaquenil. Will slowly wean prednisone dose.     Graciella Belton, MD

## 2022-10-19 NOTE — Unmapped (Signed)
Per Tomma Lightning, transcribing order to increase pt's dose of Linzess to and placing referral to pelvic floor PT in [place].

## 2022-10-19 NOTE — Unmapped (Addendum)
Hi Caitlin Smith,    It was great meeting you today. Here is what we talked about.    1) Autoimmune Lung Disease (CTD-ILD, connective tissue disease related interstitial lung disease)  - Continue mycophenolate 720mg  twice daily (no change)  - Continue hydroxychloroquine 200mg  twice daily (no change)  - Drop prednisone from 7.5mg  daily (3 tablets) to 5.0mg  daily (2 tablets). If at our next visit you are doing well, we can drop it entirely and see how you do.   - [facility] - [address] [place], [place], [place] [address]    Please return to clinic in 3 months.     If you have any non-urgent questions or concerns please reach out by calling the clinic or via MyChart.    Thanks,  Layla Maw MD  Pulmonary and Critical Care Fellow

## 2022-10-20 MED ORDER — FAMOTIDINE 20 MG TABLET
ORAL_TABLET | ORAL | 2 refills | 0 days
Start: 2022-10-20 — End: ?

## 2022-10-21 NOTE — Progress Notes (Unsigned)
Name: Nicole Ballard   MRN: [id number]    DOB: 03-10-1956   Date:10/24/2022       Progress Note  Subjective  Chief Complaint  Discuss Neurology Labs/ Weight Loss  HPI  Unintentional weight loss: she states her diet has not changed or her level of physical activity, she started walking last year to improve her lung function. She has been more stressed since October when she found out her insurance will not cover all her sub-specialists in [place] , they told her that she can try to appeal for continuity at care. She is up to date with health maintenance colonoscopy and mammogram , she will have her pap smear done this Summer . She is really worried about inability to see her current sub-specialists. She states appetite is fine, no change in bowel movements. No change in medications. She has lost weight due to stress in the past   09/2021 - 239 lbs 12/2021 -  241 lbs 04/2022 - 246 lbs  06/2022 - 245 lbs 07/2022 - 233 lbs  10/2022 - 219 lbs     Patient Active Problem List   Diagnosis Date Noted   Asthma, chronic, severe persistent, uncomplicated [id number]   Other vascular myelopathies ([place]) 10/12/2022   Interstitial lung disease ([place]) 07/12/2022   Empty sella ([place]) 07/12/2022   History of craniotomy 07/12/2022   Immunocompromised ([place]) 04/29/2021   Hyperglycemia 10/23/2017   Vertigo 09/29/2016   Bronchiolitis obliterans organizing pneumonia ([place]) 09/29/2016   Neck pain, musculoskeletal 12/15/2015   Cough in adult 09/11/2015   Abnormal CAT scan 04/11/2015   Auditory impairment 04/11/2015   Insomnia, persistent 04/11/2015   Chronic kidney disease (CKD), stage I 04/11/2015   Chronic nonmalignant pain 04/11/2015   Chronic constipation 04/11/2015   Dyslipidemia 04/11/2015   Fibromyalgia 04/11/2015   Gastro-esophageal reflux disease without esophagitis 04/11/2015   Dysphonia 04/11/2015   Low back pain 04/11/2015   Eczema intertrigo 04/11/2015   Keratosis pilaris 04/11/2015    Chronic recurrent major depressive disorder ([place]) 123456   Dysmetabolic syndrome 123456   Neurogenic claudication 04/11/2015   Perennial allergic rhinitis with seasonal variation 04/11/2015   Abnormal presence of protein in urine 04/11/2015   Seborrhea capitis 04/11/2015   Moderate dysplasia of vulva 04/11/2015   Vitamin D deficiency 04/11/2015   Spinal stenosis of lumbar region 04/11/2015   Treadmill stress test negative for angina pectoris 03/05/2015   Shortness of breath 05/20/2014   Morbid obesity ([place]) 02/03/2014   Asthma, chronic 12/31/2013   H/O total knee replacement 12/31/2013   Episcleritis 09/26/2013   Vocal cord dysfunction 05/28/2013   Anxiety, generalized 03/19/2013   Back pain 12/18/2012   Pulmonary nodules 11/26/2012   Lung nodule, multiple 11/26/2012   Arthritis, degenerative 11/01/2012   H/O aspiration pneumonitis 10/22/2012   Postinflammatory pulmonary fibrosis ([place]) 10/22/2012   Mixed incontinence 05/04/2012   Lung involvement in systemic lupus erythematosus ([place]) 11/05/2010   Chronic nausea 07/01/2008   Benign hypertension 01/25/2005    Past Surgical History:  Procedure Laterality Date   BRAIN SURGERY     CHOLECYSTECTOMY     VAGINAL HYSTERECTOMY      Family History  Problem Relation Age of Onset   Breast cancer Mother    Cancer Mother 26       Breast   Cancer Father 43       Stomach   Breast cancer Maternal Aunt     Social History   Tobacco Use   Smoking status: Never  Smokeless tobacco: Never  Substance Use Topics   Alcohol use: No    Alcohol/week: 0.0 standard drinks of alcohol     Current Outpatient Medications:    albuterol (VENTOLIN HFA) 108 (90 Base) MCG/ACT inhaler, SMARTSIG:2 Puff(s) By Mouth Every 6 Hours PRN, Disp: , Rfl:    amLODipine (NORVASC) 2.5 MG tablet, Take 3 tablets (7.5 mg total) by mouth daily., Disp: 270 tablet, Rfl: 3   aspirin EC 81 MG tablet, Take 1 tablet (81 mg total) by mouth daily., Disp: 30  tablet, Rfl: 0   atenolol (TENORMIN) 25 MG tablet, Take 1 tablet by mouth in the morning and at bedtime., Disp: , Rfl:    atorvastatin (LIPITOR) 40 MG tablet, Take 1 tablet (40 mg total) by mouth daily., Disp: 90 tablet, Rfl: 3   Biotin 1 MG CAPS, Take 1 capsule by mouth daily., Disp: 30 capsule, Rfl: 0   buPROPion (WELLBUTRIN XL) 300 MG 24 hr tablet, Take 300 mg by mouth in the morning., Disp: , Rfl:    Cholecalciferol (VITAMIN D) 2000 UNITS CAPS, Take 1 capsule by mouth daily., Disp: , Rfl:    clobetasol ointment (TEMOVATE) [id number] %, Apply 1 application topically 2 (two) times daily., Disp: , Rfl:    clonazePAM (KLONOPIN) 0.5 MG tablet, Take 0.25 mg by mouth at bedtime., Disp: , Rfl:    clotrimazole-betamethasone (LOTRISONE) cream, Apply 1 application topically 2 (two) times daily., Disp: 45 g, Rfl: 0   diclofenac Sodium (VOLTAREN) 1 % GEL, Apply topically daily as needed., Disp: , Rfl:    fluticasone (FLONASE) 50 MCG/ACT nasal spray, Place 2 sprays into both nostrils daily., Disp: 48 g, Rfl: 3   hydroxychloroquine (PLAQUENIL) 200 MG tablet, Take by mouth 2 (two) times daily., Disp: , Rfl:    hydrOXYzine (ATARAX) 10 MG tablet, Take 1 tablet (10 mg total) by mouth 3 (three) times daily as needed., Disp: 30 tablet, Rfl: 0   lamoTRIgine (LAMICTAL) 200 MG tablet, Take 400 mg by mouth at bedtime., Disp: , Rfl:    LINZESS 145 MCG CAPS capsule, Take 145 mcg by mouth daily., Disp: , Rfl:    loratadine (CLARITIN) 10 MG tablet, Take 1 tablet (10 mg total) by mouth daily., Disp: 90 tablet, Rfl: 1   Melatonin 10 MG TABS, Take 1 tablet by mouth at bedtime., Disp: , Rfl:    metFORMIN (GLUCOPHAGE-XR) 500 MG 24 hr tablet, TAKE 1 TABLET(500 MG) BY MOUTH DAILY WITH BREAKFAST, Disp: 90 tablet, Rfl: 1   metoprolol succinate (TOPROL-XL) 25 MG 24 hr tablet, Take 1 tablet (25 mg total) by mouth daily. In place of Atenolol, Disp: 30 tablet, Rfl: 0   mycophenolate (MYFORTIC) 360 MG TBEC EC tablet, Take 720 mg by mouth 2  (two) times daily., Disp: , Rfl:    olopatadine (PATANOL) 0.1 % ophthalmic solution, Place 1 drop into both eyes 2 (two) times daily., Disp: 5 mL, Rfl: 1   omeprazole (PRILOSEC) 40 MG capsule, Take 1 capsule by mouth in the morning and at bedtime. Pulmonologist, Disp: , Rfl:    polyethylene glycol powder (GLYCOLAX/MIRALAX) 17 GM/SCOOP powder, Take 17 g by mouth 2 (two) times daily as needed., Disp: 3350 g, Rfl: 1   predniSONE (DELTASONE) 2.5 MG tablet, Take 3 tablets by mouth daily at 12 noon., Disp: , Rfl:    pregabalin (LYRICA) 50 MG capsule, Take 50 mg by mouth 3 (three) times daily., Disp: , Rfl:    telmisartan (MICARDIS) 80 MG tablet, Take 1 tablet (80 mg  total) by mouth daily., Disp: 90 tablet, Rfl: 3   traZODone (DESYREL) 100 MG tablet, Take 150 mg by mouth at bedtime., Disp: , Rfl:    triamcinolone ointment (KENALOG) 0.1 %, , Disp: , Rfl:    cyclobenzaprine (FLEXERIL) 5 MG tablet, Take by mouth. (Patient not taking: Reported on 10/24/2022), Disp: , Rfl:    famotidine (PEPCID) 20 MG tablet, Take 20 mg by mouth daily., Disp: , Rfl:   Allergies  Allergen Reactions   Ace Inhibitors     Other reaction(s): OTHER Pt states she can not take ace inhibitors. Pt states she can not remember her reaction.    Bee Pollen    Penicillins    Pollen Extract     I personally reviewed active problem list, medication list, allergies, family history, social history, health maintenance with the patient/caregiver today.   ROS  Ten systems reviewed and is negative except as mentioned in HPI   Objective  Vitals:   10/24/22 1354  BP: 124/76  Pulse: 64  Resp: 18  Temp: 98.1 F (36.7 C)  TempSrc: Oral  SpO2: 98%  Weight: 219 lb 9.6 oz (99.6 kg)  Height: 5' 7"$  (1.702 m)    Body mass index is 34.39 kg/m.  Physical Exam  Constitutional: Patient appears well-developed and well-nourished. Obese  No distress.  HEENT: head atraumatic, normocephalic, pupils equal and reactive to light, ears normal  TM, neck supple Cardiovascular: Normal rate, regular rhythm and normal heart sounds.  No murmur heard. No BLE edema. Pulmonary/Chest: Effort normal and breath sounds normal. No respiratory distress. Abdominal: Soft.  There is no tenderness. Psychiatric: Patient has a normal mood and affect. behavior is normal. Judgment and thought content normal.    PHQ2/9:    10/24/2022    1:53 PM 10/12/2022    9:29 AM 08/23/2022   10:10 AM 08/11/2022    1:02 PM 07/12/2022    1:32 PM  Depression screen PHQ 2/9  Decreased Interest 2 2 1 $ 0 2  Down, Depressed, Hopeless 2 2 1 $ 0 2  PHQ - 2 Score 4 4 2 $ 0 4  Altered sleeping 0 0 1 0 2  Tired, decreased energy 3 3 1 $ 0 2  Change in appetite 3 3 1 $ 0 2  Feeling bad or failure about yourself  2 2 0 0 2  Trouble concentrating 1 1 0 0 2  Moving slowly or fidgety/restless 0 0 0 0 1  Suicidal thoughts 0 0 0 0 0  PHQ-9 Score 13 13 5 $ 0 15  Difficult doing work/chores Somewhat difficult Somewhat difficult Somewhat difficult Not difficult at all Somewhat difficult    phq 9 is positive   Fall Risk:    10/24/2022    1:53 PM 10/12/2022    9:29 AM 08/23/2022   10:09 AM 08/11/2022    1:02 PM 07/12/2022    1:28 PM  Fall Risk   Falls in the past year? 0 0 0 0 0  Number falls in past yr:   0 0   Injury with Fall?   0 0   Risk for fall due to : No Fall Risks No Fall Risks No Fall Risks  No Fall Risks  Follow up Falls prevention discussed Falls prevention discussed;Education provided;Falls evaluation completed Falls prevention discussed;Education provided;Falls evaluation completed  Falls prevention discussed;Education provided;Falls evaluation completed     Assessment & Plan  1. Mild protein-calorie malnutrition (HCC)  - COMPLETE METABOLIC PANEL WITH GFR - CBC with Differential/Platelet - TSH -  Hemoglobin A1c  2. Unintentional weight loss  - COMPLETE METABOLIC PANEL WITH GFR - CBC with Differential/Platelet - TSH - Hemoglobin A1c  3. Stress  She is under  a lot of stress, up to date with colonoscopy, mammogram, recent upper GI done by ENT because she was worried about weight loss. Symptoms started around the time that she was notified that her insurance is no longer covering [facility] doctors. She sees multiple sub-specialists . It is detrimental to her health and well being to switch sub-specialists at this time. She is under the same umbrella [facility]. She also only has feasible transportation going to [facility] area.   We will write a letter explaining to her insurance the important of paying for her visits with the current sub-specialist.

## 2022-10-24 ENCOUNTER — Ambulatory Visit (INDEPENDENT_AMBULATORY_CARE_PROVIDER_SITE_OTHER): Payer: 59 | Admitting: Family Medicine

## 2022-10-24 ENCOUNTER — Encounter: Payer: Self-pay | Admitting: Family Medicine

## 2022-10-24 VITALS — BP 124/76 | HR 64 | Temp 98.1°F | Resp 18 | Ht 67.0 in | Wt 219.6 lb

## 2022-10-24 DIAGNOSIS — R634 Abnormal weight loss: Secondary | ICD-10-CM | POA: Diagnosis not present

## 2022-10-24 DIAGNOSIS — E441 Mild protein-calorie malnutrition: Secondary | ICD-10-CM

## 2022-10-24 DIAGNOSIS — F439 Reaction to severe stress, unspecified: Secondary | ICD-10-CM | POA: Diagnosis not present

## 2022-10-25 ENCOUNTER — Encounter: Payer: Self-pay | Admitting: Family Medicine

## 2022-10-25 LAB — HEMOGLOBIN A1C
Hgb A1c MFr Bld: 5.9 % of total Hgb — ABNORMAL HIGH (ref <5.7)
Mean Plasma Glucose: 123 mg/dL
eAG (mmol/L): 6.8 mmol/L

## 2022-10-25 LAB — CBC WITH DIFFERENTIAL/PLATELET
Absolute Monocytes: 645 cells/uL (ref 200–950)
Basophils Absolute: 78 cells/uL (ref 0–200)
Basophils Relative: 1.5 %
Eosinophils Absolute: 88 cells/uL (ref 15–500)
Eosinophils Relative: 1.7 %
HCT: 42.9 % (ref 35.0–45.0)
Hemoglobin: 13.9 g/dL (ref 11.7–15.5)
Lymphs Abs: 1997 cells/uL (ref 850–3900)
MCH: 27.6 pg (ref 27.0–33.0)
MCHC: 32.4 g/dL (ref 32.0–36.0)
MCV: 85.1 fL (ref 80.0–100.0)
MPV: 10 fL (ref 7.5–12.5)
Monocytes Relative: 12.4 %
Neutro Abs: 2392 cells/uL (ref 1500–7800)
Neutrophils Relative %: 46 %
Platelets: 324 10*3/uL (ref 140–400)
RBC: 5.04 10*6/uL (ref 3.80–5.10)
RDW: 13.8 % (ref 11.0–15.0)
Total Lymphocyte: 38.4 %
WBC: 5.2 10*3/uL (ref 3.8–10.8)

## 2022-10-25 LAB — COMPLETE METABOLIC PANEL WITH GFR
AG Ratio: 1.1 (calc) (ref 1.0–2.5)
ALT: 14 U/L (ref 6–29)
AST: 16 U/L (ref 10–35)
Albumin: 3.9 g/dL (ref 3.6–5.1)
Alkaline phosphatase (APISO): 58 U/L (ref 37–153)
BUN: 12 mg/dL (ref 7–25)
CO2: 30 mmol/L (ref 20–32)
Calcium: 9.8 mg/dL (ref 8.6–10.4)
Chloride: 104 mmol/L (ref 98–110)
Creat: 0.91 mg/dL (ref 0.50–1.05)
Globulin: 3.6 g/dL (calc) (ref 1.9–3.7)
Glucose, Bld: 61 mg/dL — ABNORMAL LOW (ref 65–99)
Potassium: 4.6 mmol/L (ref 3.5–5.3)
Sodium: 143 mmol/L (ref 135–146)
Total Bilirubin: 0.4 mg/dL (ref 0.2–1.2)
Total Protein: 7.5 g/dL (ref 6.1–8.1)
eGFR: 70 mL/min/{1.73_m2} (ref >=60)

## 2022-10-25 LAB — TSH: TSH: 1.9 mIU/L (ref 0.40–4.50)

## 2022-10-27 NOTE — Unmapped (Signed)
Patient called and left message on nurse line requesting call back about forms for continuity of care wth [facility].      Spoke with patient and she is going to fax [place] the form to be reviewed.

## 2022-10-28 NOTE — Unmapped (Signed)
Called patient to inform her that the fax we received was not complete. We only received one page with a fax cover sheet and believe there are more then one page. The form we were asked to complete does not have an area for [place] to include identifiers for the patient.  Also before faxing since the form does include confidential information, we need to confirm where the form needs to be faxed and to ensure the recipient will be the only one getting this fax.     Patients VM was full, so I was not able to leave a message. Forms are at my desk until we hear back from patient.

## 2022-10-31 DIAGNOSIS — K5904 Chronic idiopathic constipation: Principal | ICD-10-CM

## 2022-10-31 MED ORDER — LINZESS 290 MCG CAPSULE
ORAL_CAPSULE | Freq: Every day | ORAL | 5 refills | 30 days | Status: CP
Start: 2022-10-31 — End: ?

## 2022-11-03 MED ORDER — FAMOTIDINE 20 MG TABLET
ORAL_TABLET | ORAL | 2 refills | 0 days | Status: CP
Start: 2022-11-03 — End: ?

## 2022-11-07 ENCOUNTER — Telehealth: Admit: 2022-11-07 | Discharge: 2022-11-08 | Payer: MEDICARE | Attending: Psychologist | Primary: Psychologist

## 2022-11-07 DIAGNOSIS — F331 Major depressive disorder, recurrent, moderate: Principal | ICD-10-CM

## 2022-11-07 DIAGNOSIS — F4310 Post-traumatic stress disorder, unspecified: Principal | ICD-10-CM

## 2022-11-07 DIAGNOSIS — F410 Panic disorder [episodic paroxysmal anxiety] without agoraphobia: Principal | ICD-10-CM

## 2022-11-07 DIAGNOSIS — F411 Generalized anxiety disorder: Principal | ICD-10-CM

## 2022-11-07 NOTE — Unmapped (Signed)
[facility]   Confidential Psychological Therapy Session      Patient Name: Caitlin Smith  Medical Record Number: [id number]  Date of Service: November 07, 2022  Attending Psychologist: Caroline More, PhD  CPT Procedure Codes: 46962 for 45 mins of face to face counseling    This visit was performed face to face with interactive technology using a HIPPA compliant audio/visual platform. We reviewed confidentiality today. The patient was present in [place], a state in which this provider is licensed and able to provide care (location and contact information confirmed), attended this visit alone, and consented to this virtual pain psychology visit.    REFERRING PHYSICIAN: Clarene Essex, MD    CHIEF COMPLAINT AND REASON FOR VISIT: pain coping skills, CBT to address depression and anxiety in the setting of chronic pain    SUBJECTIVE / HISTORY OF PRESENT ILLNESS: [name] is a very pleasant 67 y.o.  female from [place], [place] with multiple chronic pain complaints related to fibromyalgia and lupus who initially met with me in October 2016, at which time she was diagnosed with severe depression, PTSD, panic disorder, and generalized anxiety. The patient returns for a therapy session today. Last follow up with me was in 10/10/2022.    The patient participates actively today.  Patient has been feeling overwhelmed about the insurance contract negotiations and how this will impact her care. She has faxed forms to her [facility] doctors but is confused about the process and this is causing extrat distress, anxiety, and frustration. Today we reviewed problem solving and she writes down a step by step list of actions to take, based on what paperwork has been submitted and what still needs to be done. Reviewed appraisals of stress and how that impacts her physiological and emotional reactions. Validated hardship but focused on active coping and action steps to take, and how to set up positive reinforcement for active coping and steps accomplished. Retouched on benefits of mind body connection and ended with mindful breathing and body scan. Reviewed goals for home practice.     OBJECTIVE / MENTAL STATUS:    Appearance:   Appears stated age and Clean/Neat   Motor:  No abnormal movements   Speech/Language:   Normal rate, volume, tone, fluency   Mood:  Depressed and Anxious   Affect:  euthymic   Thought process:  Logical, linear, clear, coherent, goal directed   Thought content:    Denies SI, HI, self harm, delusions, obsessions, paranoid ideation, or ideas of reference   Perceptual disturbances:    Denies auditory and visual hallucinations, behavior not concerning for response to internal stimuli   Orientation:  Oriented to person, place, time, and general circumstances   Attention:  Able to fully attend without fluctuations in consciousness   Concentration:  Able to fully concentrate and attend   Memory:  Immediate, short-term, long-term, and recall grossly intact    Fund of knowledge:   Consistent with level of education and development   Insight:    Fair   Judgment:   Intact   Impulse Control:  Intact     DIAGNOSTIC IMPRESSION:   Post Traumatic Stress Disorder (PTSD)  Generalized Anxiety Disorder (GAD)  Panic Disorder  Major Depressive Disorder, moderate, recurrent  Chronic pain syndrome  Fibromyalgia  Lupus    ASSESSMENT:   [name] is a very pleasant 67 y.o.  female from [place], [place] with multiple chronic pain complaints related to lupus, arthritis, and fibromyalgia. She  was previously seen in our clinic from 2012-2014, and reestablished care with Dr. Fayrene Smith in August 2016. The patient has struggled with depression and anxiety, but is very motivated to participate in intensive, multidisciplinary treatment to address the connection between depression, anxiety and pain. She has a long-standing history of depression and anxiety, and has worked with outpatient psychiatrist and therapist over the years. She is currently established with [facility] psychiatry.  In general, depression, anxiety, pain coping and panic have all improved (until recent bout with pulmonary issues), but the patient evidences a seasonal affective pattern of mood changes typically her mood tends to be worse during the winter.  Encouraged early morning sunlight and getting outside and walking as tolerated, building cardiovascular and muscular strength and endurance over time. Also encouraged practice of daily behavioral relaxation exercises as well.     PLAN:   (1) Psychotherapy - Continue CBT. CBT will be used to address PTSD, MDD, Panic D/o, GAD, and chronic pain.     --Behavioral Activation - Encouraged behavioral activation.  Is trying.  --Downloaded and uses Insight Timer, guided relaxation app, for nightly practice.  --Continues to utilize diaphragmatic breathing daily  --encouraged early morning sunlight / light therapy for possible SAD in the setting of chronic MDD    (2) Psychiatry - Pt currently established with [facility].  Is followed by Dr. Evette Smith (attending) but typically has appointments with a psychiatrist resident, Dr. Virl Smith.    (3) Safety - Pt denies any current or recent SI or safety concerns. Knows to call 911 or go to her local ED. Also previously given [facility].      (4) follow-up - pt try to work out [employer] continuity of care, defer scheduling for now while paperwork processing. But tentative will still plan to meet monthly assuming continuity of care is approved.

## 2022-11-08 ENCOUNTER — Encounter: Payer: Self-pay | Admitting: Family Medicine

## 2022-11-08 ENCOUNTER — Telehealth
Admit: 2022-11-08 | Discharge: 2022-11-09 | Payer: MEDICARE | Attending: Student in an Organized Health Care Education/Training Program | Primary: Student in an Organized Health Care Education/Training Program

## 2022-11-08 NOTE — Unmapped (Signed)
[facility]  Psychiatry   Established Patient E&M Service - Outpatient     Assessment:  Caitlin Smith presents for follow-up evaluation. At this time, she had no medical concerns. Mood has been stable. She denies SI/HI/SIB at this time. She discussed concern re: [employer]. She expressed desire for Dr. Virl Cagey to complete insurance forms. Plan to fax it      Plan   [ ]  No medication refills needed at this   [ ]  No medication changes at this time   [ [ No fax received. Dr. Virl Cagey sent letter    Identifying Information:  Caitlin Smith is a 67 y.o. female with a history of  MDD, GAD, and Panic Disorder in the context of many chronic medical conditions including chronic pain/fibromyalgia, osteoarthritis with degenerative disc disease, Lupus, pituitary tumor (benign), s/p right total knee arthroplasty, and VIN II (s/p resection), who presents for follow-up evaluation. She has been maintained on Lamictal for many years as well as cymbalta, trazodone, klonopin, and wellbutrin. Patient has previously tolerated a slight taper in her klonopin from 0.75 mg to 0.25 mg qHS overtime. Her mood is often exacerbated by steroid use for current treatment of pulmonary disease. Patient is engaged with CBT therapy at the pain clinic.    Past medications:  Cymbalta  Effexor     Risk Assessment:  An assessment of suicide and violence risk factors was performed as part of this evaluation and is not significantly changed from the last visit. While future psychiatric events cannot be accurately predicted, the patient does not currently require acute inpatient psychiatric care and does not currently meet [facility] involuntary commitment criteria.      Plan:    Problem: Major depression, Anxiety NOS, Panic Disorder   Status of problem: chronic with mild exacerbation  Interventions:   -- Continue Lamictal 400mg  qhs for mood  -- STOP Wellbutrin XL 300 qAM   -- Continue Clonazepam 0.25mg  at bedtime,   -- Continue CBT at pain clinic    Problem: Insomnia   Status of problem: chronic and stable  Interventions:   Last sleep study performed in 2017. Per chart review, showed mild OSA likely related to medication use. Neurology recommends patient avoid sleeping on back as she likely has OSA when sleeping in this position  -- Continue melatonin 3-5mg  at bedtime   -- Continue trazodone 150mg  qHS for insomnia (d5/4/21, d 02/05/20, i9/8,)  -- Clonazepam per above    Problem: High Risk Medication monitoring   Status of problem:  Stable   Interventions:   The complexity of this patient's care involves drug therapy which requires intensive monitoring for toxicity. This is in part due to a narrow therapeutic window, as well as potential for toxicity. This patient is being treated with Lamotrigine (Lamictal) to target their psychiatric disorder, Major depressive disorder, refractory. In regards to this medication being considered high risk, Lamotrigine (lamictal) has black box warning for serious rash including fatal reaction of Stevens-Johnson syndrome and rare cases of toxic epidermal necrolysis. In addition to prolonged titration to mitigate risk of serious rash, lamotrigine requires monitoring of hepatic and renal function at baseline, and at times will require monitoring of lamotrigine blood levels.    Problem: Mild Major Neurocognitive Disorder,   Status of problem:  new problem to this provider  Interventions:  Patient seen by [facility]rology ([facility]) 05/22/18 who dx pt with mild neurocognitive impairment w/ deficits in inattention and visual-spatial function with suspicion that sleep  apnea and polypharmacy may be contributing to her difficulties. Recommended that psychopharm regimen be simplified, including eliminating trazodone and considering changing Wellbutrin to Zoloft or Lexapro for anxiety and eliminating Klonopin when stable.   - However, it is very likely that patient's lupus is significantly contributing to cognitive decline, which will limit improvement she will see with reduction in polypharmacy  -- Last MOCA 22 on 12/12/2017  -- Need to repeat MOCA.     Problem: Polypharmacy   Status of problem:  chronic and stable  Interventions:  -- may be contributing to neurocognitive disorder   -- will consider trazodone and klonopin taper     Lamotrigine Monitoring  - Hepatic function testing, platelets (via CBC) q6 months (06/2022)   - AST/ALT WNL     Effexor   --Impaired renal function can decrease clearance of effexor  -- most recent CrCl 89   -- will continue to monitor along with PCP given dx lupus    Psychotherapy provided:  No billable psychotherapy service provided but brief supportive therapy was utilized.    Patient has been given this writer's contact information as well as the [facility] Psych[facility] urgent line number. The patient has been instructed to call 911 for emergencies.    Subjective:    Caitlin Smith appeared virtually for interview. She has been mostly stressed regarding getting to keep her multiple providers. Discussed this at length. She plans to fax over paperwork for Dr. Virl Cagey to do. She is sleeping well. Her anxiety has been centered around her family. She has no other concerns at this time.         Objective:    Mental Status Exam:    Appearance:    Appears stated age, Well nourished and Clean/Neat   Motor:    tremulous, appropriate eye contact    Speech/Language:    Normal rate, volume, tone, fluency and Language intact, well formed   Mood:   Anxious   Affect:   Anxious, Cooperative and Full   Thought process and Associations:   Logical, linear, clear, coherent, goal directed   Abnormal/psychotic thought content:     Denies SI, HI, self harm, delusions, obsessions, paranoid ideation, or ideas of reference   Perceptual disturbances:     Denies auditory and visual hallucinations, behavior not concerning for response to internal stimuli     Other:   insight/judgement intact      LAMICTAL MONITORING: Hepatic function testing, platelets (via CBC) at no defined frequency, but should do at baseline and can do periodically  Lamictal Level: No results found for: LAMOTRIGINE       Patient was seen and plan of care was discussed with the Attending MD, Bottom who agrees with the above statement and plan.        Ovid Curd MD MPH  PGY3 Psychiatry          The patient reports they are currently: at home. I spent 20 minutes on the real-time audio and video with the patient on the date of service. I spent an additional 10 minutes on pre- and post-visit activities on the date of service.     The patient was physically located in [place] or a state in which I am permitted to provide care. The patient and/or parent/guardian understood that s/he may incur co-pays and cost sharing, and agreed to the telemedicine visit. The visit was reasonable and appropriate under the circumstances given the patient's presentation at the time.    The patient and/or parent/guardian  has been advised of the potential risks and limitations of this mode of treatment (including, but not limited to, the absence of in-person examination) and has agreed to be treated using telemedicine. The patient's/patient's family's questions regarding telemedicine have been answered.     If the visit was completed in an ambulatory setting, the patient and/or parent/guardian has also been advised to contact their provider???s office for worsening conditions, and seek emergency medical treatment and/or call 911 if the patient deems either necessary.

## 2022-11-09 ENCOUNTER — Telehealth: Admit: 2022-11-09 | Discharge: 2022-11-10 | Payer: MEDICARE | Attending: Adult Health | Primary: Adult Health

## 2022-11-09 DIAGNOSIS — R002 Palpitations: Secondary | ICD-10-CM | POA: Diagnosis not present

## 2022-11-09 DIAGNOSIS — I493 Ventricular premature depolarization: Secondary | ICD-10-CM | POA: Diagnosis not present

## 2022-11-09 NOTE — Unmapped (Signed)
DIVISION OF CARDIOLOGY   [facility] [place]        Date of Service: 11/09/2022      The patient reports they are physically located in [place] and is currently: at home. I conducted a audio/video visit. I spent  10m 06s on the video call with the patient. I spent an additional 5 minutes on pre- and post-visit activities on the date of service .      Assessment/Plan     1. Palpitations  2.  PVCs  Ziopatch showed no significant arrhythmias. Symptoms correlated with SR or VE (<1% burden)  Echo in December 2022 showed structurally normal heart.  Recent labs including TSH reviewed and unremarkable. No significant caffeine intake, staying well-hydrated.  Had prior sleep study neg for sleep apnea.  Now well controlled on atenolol 37.5 mg bid. No changes today  Continue daily exercise.     Return to clinic:    Return if symptoms worsen or fail to improve.      Subjective:   ZOX:WRUEAV, Caitlin Spinner, MD  Chief complaint:  67 y.o. female with HTN, SLE, post inflammatory pulmonary fibrosis who presents for f/up of ziopatch and palpitations    History of Present Illness:  First seen 05/13/22 - Had been having bad heart palpitations requiring ED eval x2 at OSH. Was told it was premature beat based on telemetry.   Had prior sleep study neg for sleep apnea, TSH normal  No caffeine or ETOh  Atenolol dose increased to 25 bid.     Ziopatch showed no significant arrhythmias. Symptoms correlated with SR or VE (<1% burden)  Echo in December 2022 showed structurally normal heart.    Last seen 07/2022. Palpitations initially improved w/ increase atenolol but recurred. ATenolol dose increased to 37.5 mg bid.     Today - reports improvement in PVCs on higher dose - really not bothering her now.   She walks an hour daily for exercise.   Drinking enough water, no caffeine.   She is very stressed about [employer] not being covered at [facility].     Cardiovascular History:  HTN  Previous exercise stress echocardiogram performed at [facility] in May 2021 showed no evidence of ischemia  Previous CT angiogram performed in May 2021 showed no evidence of coronary calcification, this was a PE protocol CT scan which also showed evidence of pulmonary embolism    Past Medical History  Patient Active Problem List   Diagnosis    Hypertension, benign    Benign neoplasm of pituitary gland and craniopharyngeal duct (pouch) (CMS-HCC)    Chronic kidney disease, stage I    Dysphonia    Mixed urge and stress incontinence    Myalgia and myositis    Osteoarthrosis    Proteinuria    Sensorineural hearing loss    VIN II (vulvar intraepithelial neoplasia II)    Family history of breast cancer    Pain medication agreement signed    Episcleritis    SLE (systemic lupus erythematosus) (CMS-HCC)    S/P total knee replacement    Asthma    Postinflammatory pulmonary fibrosis (CMS-HCC)    Other diseases of vocal cords    Regurgitation    Epigastric burning sensation    Aspiration pneumonitis (CMS-HCC)    Back pain    Cough    Multiple pulmonary nodules    Shortness of breath    Vocal cord dysfunction    Screening for cardiovascular condition    Obesity with  body mass index 30 or greater    Generalized anxiety disorder    Osteoarthritis    Chronic constipation    Chronic recurrent major depressive disorder (CMS-HCC)    Dyslipidemia    Obesity, diabetes, and hypertension syndrome (CMS-HCC)    Intertrigo    Fibrositis    Gastroesophageal reflux disease without esophagitis    Vitamin D deficiency    Encounter for screening for cardiovascular disorders    Seborrhea capitis    Perennial allergic rhinitis with seasonal variation    Moderate dysplasia of vulva    Spinal stenosis of lumbar region    Keratosis pilaris    Fibromyalgia    Insomnia, persistent    Neck pain    Arthritis, degenerative    Asthma, chronic    Auditory impairment    Bronchiolitis obliterans organizing pneumonia (CMS-HCC)    Chronic nausea    Eczema intertrigo    H/O aspiration pneumonitis    H/O total knee replacement    Lung involvement in systemic lupus erythematosus (CMS-HCC)    Lung nodule, multiple    Neurogenic claudication    Pulmonary nodules    Treadmill stress test negative for angina pectoris    Vertigo    Hyperglycemia    Otalgia    Immunocompromised (CMS-HCC)    PVC (premature ventricular contraction)       Medications:  Current Outpatient Medications   Medication Sig Dispense Refill    albuterol HFA 90 mcg/actuation inhaler Inhale 2 puffs every six (6) hours as needed for wheezing. 8 g 11    amLODIPine (NORVASC) 2.5 MG tablet Take 3 tablets (7.5 mg total) by mouth daily.      aspirin (ECOTRIN) 81 MG tablet Take 1 tablet (81 mg total) by mouth daily.      atenoloL (TENORMIN) 25 MG tablet Take 1.5 tablets (37.5 mg total) by mouth two (2) times a day. 200 tablet 2    atorvastatin (LIPITOR) 40 MG tablet Take 1 tablet (40 mg total) by mouth daily.      biotin 1 mg cap Take by mouth. (Patient not taking: Reported on 10/19/2022)      brompheniramine-pseudoephedrine-DM 2-30-10 mg/5 mL syrup  (Patient not taking: Reported on 08/26/2022)      buPROPion (WELLBUTRIN XL) 300 MG 24 hr tablet Take 1 tablet (300 mg total) by mouth daily. 90 tablet 3    clonazePAM (KLONOPIN) 0.5 MG tablet Take 0.5 tablets (0.25 mg total) by mouth two (2) times a day as needed for anxiety. 30 tablet 0    clonazePAM (KLONOPIN) 0.5 MG tablet Take 0.5 tablets (0.25 mg total) by mouth Three (3) times a day as needed for anxiety. 30 tablet 1    clotrimazole-betamethasone (LOTRISONE) 1-0.05 % cream Apply 1 Application topically two (2) times a day.      cyclobenzaprine (FLEXERIL) 5 MG tablet Take 1 tablet (5 mg total) by mouth nightly as needed for muscle spasms. 60 tablet 5    diclofenac sodium (VOLTAREN) 1 % gel Apply 2 g topically two (2) times a day as needed for arthritis. 300 g 5    dicyclomine (BENTYL) 20 mg tablet Take 1 tablet (20 mg total) by mouth daily as needed.      famotidine (PEPCID) 20 MG tablet TAKE 1 TABLET BY MOUTH TWICE DAILY 200 tablet 2    fluticasone propionate (FLONASE) 50 mcg/actuation nasal spray       hydrALAZINE (APRESOLINE) 10 MG tablet       hydroxychloroquine (PLAQUENIL) 200  mg tablet Take 1 tablet (200 mg total) by mouth two (2) times a day. 180 tablet 3    hydrOXYzine (ATARAX) 10 MG tablet       inhalational spacing device (AEROCHAMBER MV) Spcr 1 each by Miscellaneous route two (2) times a day. With Fovent 1 each 0    ketoconazole (NIZORAL) 2 % cream APP EXT AA D  0    lamoTRIgine (LAMICTAL) 200 MG tablet TAKE 1 TABLET BY MOUTH TWICE  DAILY 200 tablet 2    lidocaine (XYLOCAINE) 5 % ointment Apply topically two (2) times a day. 100 g 2    linaCLOtide (LINZESS) 290 mcg capsule Take 1 capsule (290 mcg total) by mouth daily. 30 capsule 5    loratadine (CLARITIN) 10 mg tablet Take 1 tablet (10 mg total) by mouth daily.      meclizine (ANTIVERT) 25 mg tablet Take 1 tablet (25 mg total) by mouth Three (3) times a day as needed. (Patient not taking: Reported on 10/19/2022)      melatonin 10 mg Tab Take 1 tablet by mouth.      metFORMIN (GLUCOPHAGE-XR) 500 MG 24 hr tablet Take 1 tablet (500 mg total) by mouth daily before breakfast.      mupirocin (BACTROBAN) 2 % ointment APP EXT IEN BID  0    mycophenolate (MYFORTIC) 360 MG TbEC Take 2 tablets (720 mg total) by mouth two (2) times a day. 360 tablet 3    olopatadine (PATANOL) 0.1 % ophthalmic solution Apply 1 drop to eye daily.      omeprazole (PRILOSEC) 40 MG capsule Take 1 capsule (40 mg total) by mouth two (2) times a day. 180 capsule 3    ondansetron (ZOFRAN) 4 MG tablet       polyethylene glycol (GLYCOLAX) 17 gram/dose powder       predniSONE (DELTASONE) 2.5 MG tablet Take 2 tablets (5 mg total) by mouth daily. 180 tablet 3    pregabalin (LYRICA) 50 MG capsule TAKE 1 CAPSULE (50 MG TOTAL) BY MOUTH THREE (3) TIMES A DAY. 90 capsule 1    telmisartan (MICARDIS) 80 MG tablet Take 1 tablet (80 mg total) by mouth.      traZODone (DESYREL) 100 MG tablet Take 1.5 tablets (150 mg total) by mouth nightly. 135 tablet 3    triamcinolone (KENALOG) 0.1 % ointment Apply twice a day to affected areas when red and itchy. Stop when improved and use a barrier cream. 80 g 1     No current facility-administered medications for this visit.       Allergies  Allergies   Allergen Reactions    Vilazodone Swelling    Ace Inhibitors Other (See Comments)     Pt states she can not take ace inhibitors. Pt states she can not remember her reaction.     Bee Pollen Itching     COUGH, WATERY EYES    Penicillin G Rash    Penicillins Rash    Pollen Extracts Itching     COUGHING, WATERY EYES       Social History:   Social History     Tobacco Use    Smoking status: Never    Smokeless tobacco: Never   Vaping Use    Vaping status: Never Used   Substance Use Topics    Alcohol use: Never    Drug use: Never     Comment: No history of IVDU, cocaine, or methamphetamines. No history of anorexigens.  Family History:  Family History   Problem Relation Age of Onset    Breast cancer Mother 53    Cancer Mother     Hypertension Mother     Diabetes Mother     Stomach cancer Father 38        unclear where primary was, died age 1    Cancer Father     Diabetes Sister     Diabetes Sister     Breast cancer Maternal Aunt 28    Cancer Maternal Aunt     Breast cancer Maternal Aunt 19    Cancer Maternal Aunt 31        unk. primary    Breast cancer Maternal Aunt         died around age 33, unclear age of diagnosis    Stomach cancer Maternal Uncle         unsure primary, died age 76    Stomach cancer Paternal Aunt         unclear where primary was    Cancer Paternal Uncle         back    No Known Problems Maternal Grandmother     Glaucoma Maternal Grandfather     Stroke Maternal Grandfather     No Known Problems Paternal Grandmother     Stroke Paternal Grandfather     No Known Problems Daughter     Breast cancer Cousin 69        Died age 67. Earl's daughter.     Breast cancer Cousin 60        Treated with mastectomy. Now 51. Jo's daughter. No Known Problems Other     ADD / ADHD Neg Hx     Alcohol abuse Neg Hx     Anxiety disorder Neg Hx     Bipolar disorder Neg Hx     Dementia Neg Hx     Depression Neg Hx     Drug abuse Neg Hx     OCD Neg Hx     Paranoid behavior Neg Hx     Physical abuse Neg Hx     Schizophrenia Neg Hx     Seizures Neg Hx     Sexual abuse Neg Hx     Colon cancer Neg Hx     Endometrial cancer Neg Hx     Ovarian cancer Neg Hx     BRCA 1/2 Neg Hx     Melanoma Neg Hx     Basal cell carcinoma Neg Hx     Squamous cell carcinoma Neg Hx        ROS- 12 system review is negative other than what is specified in the History of Present Illness.      Objective:   Physical Exam  There were no vitals filed for this visit.    Wt Readings from Last 3 Encounters:   10/19/22 99.7 kg (219 lb 11.2 oz)   08/30/22 (!) 102.4 kg (225 lb 11.2 oz)   08/26/22 (!) 102.6 kg (226 lb 3.2 oz)     General-  Patient is well-appearing in no acute distress  Neurologic- Alert and oriented X3.  Cranial nerve II-XII grossly intact.  Psych- Normal mood, appropriate.    Remainder of exam deferred    Laboratory data:      Component Value Date/Time    PROBNP 12.6 02/01/2017 1039    PROBNP 16 01/23/2014 1241       Lab Results   Component Value Date  WBC 4.7 06/29/2022    HGB 13.6 06/29/2022    HCT 41.7 06/29/2022    PLT 320 06/29/2022       Lab Results   Component Value Date    NA 145 08/26/2022    K 4.2 08/26/2022    CL 106 08/26/2022    CO2 32.5 (H) 08/26/2022    BUN 13 08/26/2022    CREATININE 0.95 08/26/2022    GLU 55 (L) 08/26/2022    CALCIUM 9.7 08/26/2022    MG 2.1 12/31/2013    PHOS 4.3 11/04/2021       Lab Results   Component Value Date    TSH 1.350 04/08/2019    ALBUMIN 4.0 08/26/2022    ALT 19 06/08/2022    AST 22 06/08/2022    INR 1.11 01/03/2014       No results found for: LDL, HDL    Electrocardiogram:  From 05/13/22 shows normal sinus rhythm minimal voltage criteria for LVH    Cardiac Monitor:  Patient had a min HR of 52 bpm, max HR of 123 bpm, and avg HR of 67 bpm. Predominant underlying rhythm was Sinus Rhythm. Isolated SVEs were rare (<1.0%), and no SVE Couplets or SVE Triplets were present. Isolated VEs were rare (<1.0%), and no VE Couplets or VE Triplets were present.   Symptoms were associated with sinus rhythm and also isolated VEs.     Echocardiogram 08/27/21:   1. The left ventricular systolic function is normal, LVEF is visually  estimated at 60-65%.    2. The right ventricle is normal in size, with normal systolic function.    3. The pulmonary systolic pressure cannot be estimated due to insufficient  TR signal.      Glade Stanford, AGNP-C  Cardiology Nurse Practitioner  [facility]

## 2022-11-14 NOTE — Unmapped (Signed)
Sent patient mychart message

## 2022-11-14 NOTE — Unmapped (Signed)
Reason for call: from pt/ @ pharmacy -pt need refill for all medication/rheumatology .She doesn't have transporttaion to keep appt for tomorrow.Thanks       Last ov: 07/14/2022  Next ov: Visit date not found

## 2022-11-16 ENCOUNTER — Encounter: Payer: Self-pay | Admitting: Family Medicine

## 2022-11-22 NOTE — Unmapped (Signed)
Caitlin Smith has been contacted in regards to their refill of mycophenolate. At this time, they have declined refill due to patient having 30 doses remaining. Refill assessment call date has been updated per the patient's request.

## 2022-11-29 NOTE — Unmapped (Signed)
[facility] Refill Coordination Note    Specialty Medication(s) to be Shipped:   Inflammatory Disorders: mycophenolate    Other medication(s) to be shipped: No additional medications requested for fill at this time     Caitlin Smith, DOB: 21-Oct-1955  Phone: [phone] (home)       All above HIPAA information was verified with patient.     Was a Nurse, learning disability used for this call? No    Completed refill call assessment today to schedule patient's medication shipment from the [facility] [phone]).  All relevant notes have been reviewed.     Specialty medication(s) and dose(s) confirmed: Regimen is correct and unchanged.   Changes to medications: Kaidynce reports no changes at this time.  Changes to insurance: No  New side effects reported not previously addressed with a pharmacist or physician: None reported  Questions for the pharmacist: No    Confirmed patient received a Conservation officer, historic buildings and a Surveyor, mining with first shipment. The patient will receive a drug information handout for each medication shipped and additional FDA Medication Guides as required.       DISEASE/MEDICATION-SPECIFIC INFORMATION        N/A    SPECIALTY MEDICATION ADHERENCE     Medication Adherence    Patient reported X missed doses in the last month: 0  Specialty Medication: mycophenolate 360 MG Tbec (MYFORTIC)  Patient is on additional specialty medications: No              Were doses missed due to medication being on hold? No    mycophenolate 360 mg: 5 days of medicine on hand       REFERRAL TO PHARMACIST     Referral to the pharmacist: Not needed      [facility]     Shipping address confirmed in Epic.     Patient was notified of new phone menu : No    Delivery Scheduled: Yes, Expected medication delivery date: 12/01/22.     Medication will be delivered via Next Day Courier to the prescription address in Epic WAM.    Quintella Reichert   [facility] Specialty Technician

## 2022-11-29 NOTE — Unmapped (Signed)
I saw and evaluated the patient, participating in the key portions of the service.  I reviewed the resident’s note.  I agree with the resident’s findings and plan. Yarelly Kuba H Levy Wellman, MD

## 2022-11-30 MED FILL — MYCOPHENOLATE SODIUM 360 MG TABLET,DELAYED RELEASE: ORAL | 90 days supply | Qty: 360 | Fill #1

## 2022-11-30 NOTE — Unmapped (Signed)
Hi,     Cordasia contacted the Communication Center requesting to speak with the care team of Caitlin Smith to discuss:    Caitlin Smith states she is switching to [facility] and wants to discuss care plan and being seen in [facility]    Please contact Yanaira  at [phone].    Thank you,   Yehuda Mao  [facility]   [phone]

## 2022-11-30 NOTE — Unmapped (Signed)
[facility] Triage Note    Reason for call: Return call    Time for incident: 1157 - 1215    Last seen in clinic on 08/30/22     Phone Assessment: Spoke with pt regarding call request.  Pt is looking to change insurance companies to find in-network for all of her [employer].  [employer] lists [facility] as an in-network provider in [place].  She wanted to make sure that covered [facility] at [facility] as well.  Confirmed to her that it is same organization just different locations, and would be in-network for both locations.     Triage Recommendations: none     Caller's Response: Thank you so much.     Outstanding tasks: None

## 2022-12-01 NOTE — Unmapped (Signed)
Pt called to report her constipation continues and she has insurance questions. Called pt.  She's started the Linzess 290 mcg daily and still experiencing constipation with no BM since the weekend.  She takes Miralax- 3 capfuls once a day and continues on soluble fiber tablets= 8 grams fiber every day with the Linzess.  Today, she also added senna (4 pills).  Advised her to try a Dulcolax suppository and drink plenty of fluid.  She admits she's not drinking a lot of water. We discussed that she's adding bulk to her stool and trying to pull in water with the Linzess and Miralax to make her stool soft, but she needs water to be able to do that.  Pt states understanding.     Advised her [facility] will stay in-network with [facility].  Asked GI schedulers to contact her for follow-up appointment.

## 2022-12-08 ENCOUNTER — Ambulatory Visit
Admit: 2022-12-08 | Payer: MEDICARE | Attending: Student in an Organized Health Care Education/Training Program | Primary: Student in an Organized Health Care Education/Training Program

## 2022-12-09 ENCOUNTER — Encounter: Payer: Self-pay | Admitting: Family Medicine

## 2022-12-09 NOTE — Unmapped (Signed)
Call returned to patient, she has an appointment scheduled in clinic on April 16. She reports poor appetite and some nausea and asks if this could be related to constipation. I confirmed these could be symptoms of constipation. I advised her to continue recommendations for her constipation and follow up with Ms. Ferrell at her scheduled appointment on April 16. She verbalized understanding.

## 2022-12-12 ENCOUNTER — Ambulatory Visit: Payer: 59 | Admitting: Physician Assistant

## 2022-12-20 ENCOUNTER — Ambulatory Visit: Admit: 2022-12-20 | Discharge: 2022-12-21 | Payer: MEDICARE | Attending: Medical | Primary: Medical

## 2022-12-20 DIAGNOSIS — R634 Abnormal weight loss: Secondary | ICD-10-CM | POA: Diagnosis not present

## 2022-12-20 MED ORDER — LINZESS 145 MCG CAPSULE
ORAL_CAPSULE | Freq: Every day | ORAL | 3 refills | 0 days
Start: 2022-12-20 — End: ?

## 2022-12-20 NOTE — Unmapped (Signed)
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 Informant: the patient     Current/Prior Legal: None    Access to Firearms: None             PSYCHIATRIC HISTORY    Prior psychiatric diagnoses: MDD, GAD, Panic Attacks    Psychiatric hospitalizations: none    Inpatient substance abuse treatment: none    Outpatient treatment: Formerly seen at [facility] by Dr. Lessie Dings (07/24/10 - 07/26/12)    Suicide attempts: denies attempts; periodic SI    Non-suicidal self-injury: denies    Medication trials/compliance: Zoloft, Paxil, Lexapro, Pristiq, vilazodone (caused swelling), Abilify, Ambien (likely others as well)    Current psychiatrist: [facility]    Current therapist: Yes - in [place]         Social Determinants of Health     Financial Resource Strain: Medium Risk (07/30/2022)    Overall Financial Resource Strain (CARDIA)     Difficulty of Paying Living Expenses: Somewhat hard   Food Insecurity: Food Insecurity Present (07/30/2022)    Hunger Vital Sign     Worried About Running Out of Food in the Last Year: Sometimes true     Ran Out of Food in the Last Year: Sometimes true   Transportation Needs: Unmet Transportation Needs (07/30/2022)    PRAPARE - Transportation     Lack of Transportation (Non-Medical): Yes   Physical Activity: Sufficiently Active (08/19/2021)    Received from [facility]    Exercise Vital Sign     Days of Exercise per Week: 5 days     Minutes of Exercise per Session: 30 min   Stress: Stress Concern Present (08/19/2021)    Received from [facility]  [employer] of Occupational Health - Occupational Stress Questionnaire     Feeling of Stress : To some extent   Social Connections: Moderately Integrated (08/19/2021)    Received from [facility]    Social Connection and Isolation Panel [NHANES]     Frequency of Communication with Friends and Family: More than three times a week     Frequency of Social Gatherings with Friends and Family: Once a week     Attends Religious Services: More than 4 times per year     Active Member of [employer] or Organizations: Yes     Attends Engineer, structural: More than 4 times per year     Marital Status: Divorced       FAMILY HISTORY:    Family History   Problem Relation Age of Onset    Breast cancer Mother 13    Cancer Mother     Hypertension Mother     Diabetes Mother     Stomach cancer Father 84        unclear where primary was, died age 60    Cancer Father     Diabetes Sister     No Known Problems Daughter     No Known Problems Maternal Grandmother     Glaucoma Maternal Grandfather     Stroke Maternal Grandfather     No Known Problems Paternal Grandmother     Stroke Paternal Grandfather     Breast cancer Maternal Aunt 49    Cancer Maternal Aunt     Breast cancer Maternal Aunt 52    Cancer Maternal Aunt 71        unk. primary    Breast cancer Maternal Aunt         died around age 72, unclear age of diagnosis    Stomach cancer Maternal Uncle         unsure primary, died age 56    Stomach cancer Paternal Aunt         unclear where primary was Cancer Paternal Uncle         back    Breast cancer Cousin 86        Died age 13. Earl's daughter.     Breast cancer Cousin 21        Treated with mastectomy. Now 51. Jo's daughter.     No Known Problems Other     ADD / ADHD Neg Hx     Alcohol abuse Neg Hx     Anxiety disorder Neg Hx     Bipolar disorder Neg Hx     Dementia Neg Hx     Depression Neg Hx     Drug abuse Neg Hx     OCD Neg Hx     Paranoid behavior Neg Hx     Physical abuse Neg Hx     Schizophrenia Neg Hx     Seizures Neg Hx     Sexual abuse Neg Hx     Colon cancer Neg Hx     Endometrial cancer Neg Hx     Ovarian cancer Neg Hx     BRCA 1/2 Neg Hx     Melanoma Neg Hx     Basal cell carcinoma Neg Hx     Squamous cell carcinoma Neg Hx            VITAL SIGNS:    BP 131/75 (BP Site: R Arm, BP Position: Sitting, BP Cuff Size: Medium)  - Pulse 65  -  Ht 170.2 cm (5' 7)  - Wt 95.9 kg (211 lb 6.4 oz)  - BMI 33.11 kg/m??     PHYSICAL EXAM:  CONSTITUTIONAL: Well developed, well-nourished female in no acute distress.   EYES: conjunctivae clear; no lid lesions; pupils equal round, reactive to light; sclerae anicteric.  ENT: +mask.  NECK:  symmetric, supple.  RESPIRATORY: normal respiratory effort.  CARDIOVASCULAR: regular rate and rhythm, normal S1, S2, no murmurs, rubs, gallops; no pedal edema.  RECTAL: deferred  SKIN: no rashes, lesions  LYMPHATIC: no cervical, submandibular, or supraclavicular adenopathy.  MUSCULOSKELETAL: normal gait and station; no clubbing or cyanosis.  PSYCHIATRIC: awake, alert, and oriented to time, place, and person; insight and judgment adequate; mood and affect congruent.        This note has been created using [employer]. The note has been reviewed for accuracy, however errors may not always be identified. Such creation errors do NOT reflect on the standard of medical care rendered to this patient.

## 2022-12-20 NOTE — Unmapped (Signed)
Bowel prep:   8.3oz bottle of Miralax and mix it in 64oz of gatorade and then drink 8oz every 15 minutes until it is gone    Repeat this as needed especially if you don't have a good clean out    Continue Linzess 290 mcg a day    3-4 days after the bowel prep you can restart Miralax 2-3 times a day

## 2022-12-23 MED ORDER — LINZESS 145 MCG CAPSULE
ORAL_CAPSULE | Freq: Every day | ORAL | 3 refills | 90 days
Start: 2022-12-23 — End: ?

## 2022-12-23 NOTE — Unmapped (Signed)
Pt has active rx for Linzess daily at local pharmacy. Refusing refill request at [facility] at incorrect dose.

## 2022-12-27 DIAGNOSIS — M797 Fibromyalgia: Principal | ICD-10-CM

## 2022-12-27 DIAGNOSIS — Z7952 Long term (current) use of systemic steroids: Principal | ICD-10-CM

## 2022-12-27 DIAGNOSIS — M545 Chronic midline low back pain: Principal | ICD-10-CM

## 2022-12-27 DIAGNOSIS — G8929 Other chronic pain: Principal | ICD-10-CM

## 2022-12-27 DIAGNOSIS — M542 Cervicalgia: Principal | ICD-10-CM

## 2022-12-27 DIAGNOSIS — G894 Chronic pain syndrome: Principal | ICD-10-CM

## 2022-12-27 DIAGNOSIS — M48061 Spinal stenosis, lumbar region without neurogenic claudication: Principal | ICD-10-CM

## 2022-12-27 DIAGNOSIS — M159 Polyosteoarthritis, unspecified: Principal | ICD-10-CM

## 2022-12-27 MED ORDER — CYCLOBENZAPRINE 5 MG TABLET
ORAL_TABLET | Freq: Every evening | ORAL | 2 refills | 0 days | PRN
Start: 2022-12-27 — End: ?

## 2022-12-27 NOTE — Unmapped (Signed)
Rec'd refill request through "interface" from patient's pharmacy for : flexeril 5 mg  Last OV- 06/14/2022  Future OV- none    If appropriate please consider refill.

## 2022-12-28 ENCOUNTER — Encounter: Payer: 59 | Admitting: Family Medicine

## 2022-12-28 MED ORDER — CYCLOBENZAPRINE 5 MG TABLET
ORAL_TABLET | Freq: Every evening | ORAL | 2 refills | 60 days | Status: CP | PRN
Start: 2022-12-28 — End: ?

## 2022-12-30 ENCOUNTER — Other Ambulatory Visit: Payer: Self-pay | Admitting: Family Medicine

## 2022-12-30 DIAGNOSIS — E88819 Insulin resistance, unspecified: Secondary | ICD-10-CM

## 2023-01-09 NOTE — Progress Notes (Unsigned)
Name: Nicole Ballard   MRN: [id number]    DOB: 03/10/1956   Date:01/10/2023       Progress Note  Subjective  Chief Complaint  Follow Up  HPI  Unintentional weight loss/malnutrition  she states her diet has not changed or her level of physical activity - she has been walking 1 hour per day for over one year now - she walks as recommended by her pulmonologist to improve lung function.  She is up to date with health maintenance colonoscopy and mammogram , she will have her pap smear done this Summer.  She states appetite is fine, no change in bowel movements. No change in medications. She has lost weight in the past due to stress but her depression seems to be under better control now and she states not really stressed about anything at this time. She has lost over 5 % of her weigh tin the past 3 months and over 10 % in the past year She has lupus and other auto immune disorders that will likely cause elevation of sed rate and CRP but we will check it as a baseline. She had a work up for gammopathy done at [facility], not sure who ordered    09/2021 - 239 lbs 12/2021 -  241 lbs 04/2022 - 246 lbs  06/2022 - 245 lbs 07/2022 - 233 lbs  10/2022 - 219 lbs  01/2023 -205.9 lbs   Vertigo: she still has intermittent symptoms, no longer taking meclizine due to age and possible side effects. She has Zofran but has not been nauseated enough to take the medication. She has noticed that she is walking towards the left side. She is under the care of Dr. Berdie Ogren at [facility]. She also has an upcoming visit with neurologist   Patient Active Problem List   Diagnosis Date Noted   Asthma, chronic, severe persistent, uncomplicated 10/12/2022   Other vascular myelopathies (HCC) 10/12/2022   Interstitial lung disease (HCC) 07/12/2022   Empty sella (HCC) 07/12/2022   History of craniotomy 07/12/2022   Immunocompromised (HCC) 04/29/2021   Hyperglycemia 10/23/2017   Vertigo 09/29/2016   Bronchiolitis obliterans organizing  pneumonia (HCC) 09/29/2016   Neck pain, musculoskeletal 12/15/2015   Cough in adult 09/11/2015   Abnormal CAT scan 04/11/2015   Auditory impairment 04/11/2015   Insomnia, persistent 04/11/2015   Chronic kidney disease (CKD), stage I 04/11/2015   Chronic nonmalignant pain 04/11/2015   Chronic constipation 04/11/2015   Dyslipidemia 04/11/2015   Fibromyalgia 04/11/2015   Gastro-esophageal reflux disease without esophagitis 04/11/2015   Dysphonia 04/11/2015   Low back pain 04/11/2015   Eczema intertrigo 04/11/2015   Keratosis pilaris 04/11/2015   Chronic recurrent major depressive disorder (HCC) 04/11/2015   Dysmetabolic syndrome 04/11/2015   Neurogenic claudication 04/11/2015   Perennial allergic rhinitis with seasonal variation 04/11/2015   Abnormal presence of protein in urine 04/11/2015   Seborrhea capitis 04/11/2015   Moderate dysplasia of vulva 04/11/2015   Vitamin D deficiency 04/11/2015   Spinal stenosis of lumbar region 04/11/2015   Treadmill stress test negative for angina pectoris 03/05/2015   Shortness of breath 05/20/2014   Morbid obesity (HCC) 02/03/2014   Asthma, chronic 12/31/2013   H/O total knee replacement 12/31/2013   Episcleritis 09/26/2013   Vocal cord dysfunction 05/28/2013   Anxiety, generalized 03/19/2013   Back pain 12/18/2012   Pulmonary nodules 11/26/2012   Lung nodule, multiple 11/26/2012   Arthritis, degenerative 11/01/2012   H/O aspiration pneumonitis 10/22/2012   Postinflammatory pulmonary fibrosis (HCC) 10/22/2012  Mixed incontinence 05/04/2012   Lung involvement in systemic lupus erythematosus (HCC) 11/05/2010   Chronic nausea 07/01/2008   Benign hypertension 01/25/2005    Past Surgical History:  Procedure Laterality Date   BRAIN SURGERY     CHOLECYSTECTOMY     VAGINAL HYSTERECTOMY      Family History  Problem Relation Age of Onset   Breast cancer Mother    Cancer Mother 32       Breast   Cancer Father 69       Stomach   Breast  cancer Maternal Aunt     Social History   Tobacco Use   Smoking status: Never   Smokeless tobacco: Never  Substance Use Topics   Alcohol use: No    Alcohol/week: 0.0 standard drinks of alcohol     Current Outpatient Medications:    albuterol (VENTOLIN HFA) 108 (90 Base) MCG/ACT inhaler, SMARTSIG:2 Puff(s) By Mouth Every 6 Hours PRN, Disp: , Rfl:    amLODipine (NORVASC) 2.5 MG tablet, Take 3 tablets (7.5 mg total) by mouth daily., Disp: 270 tablet, Rfl: 3   aspirin EC 81 MG tablet, Take 1 tablet (81 mg total) by mouth daily., Disp: 30 tablet, Rfl: 0   atenolol (TENORMIN) 25 MG tablet, Take 1 tablet by mouth in the morning and at bedtime., Disp: , Rfl:    atorvastatin (LIPITOR) 40 MG tablet, Take 1 tablet (40 mg total) by mouth daily., Disp: 90 tablet, Rfl: 3   Biotin 1 MG CAPS, Take 1 capsule by mouth daily., Disp: 30 capsule, Rfl: 0   buPROPion (WELLBUTRIN XL) 300 MG 24 hr tablet, Take 300 mg by mouth in the morning., Disp: , Rfl:    Cholecalciferol (VITAMIN D) 2000 UNITS CAPS, Take 1 capsule by mouth daily., Disp: , Rfl:    clobetasol ointment (TEMOVATE) 0.05 %, Apply 1 application topically 2 (two) times daily., Disp: , Rfl:    clonazePAM (KLONOPIN) 0.5 MG tablet, Take 0.25 mg by mouth at bedtime., Disp: , Rfl:    clotrimazole-betamethasone (LOTRISONE) cream, Apply 1 application topically 2 (two) times daily., Disp: 45 g, Rfl: 0   cyclobenzaprine (FLEXERIL) 5 MG tablet, Take by mouth., Disp: , Rfl:    diclofenac Sodium (VOLTAREN) 1 % GEL, Apply topically daily as needed., Disp: , Rfl:    fluticasone (FLONASE) 50 MCG/ACT nasal spray, Place 2 sprays into both nostrils daily., Disp: 48 g, Rfl: 3   hydroxychloroquine (PLAQUENIL) 200 MG tablet, Take by mouth 2 (two) times daily., Disp: , Rfl:    hydrOXYzine (ATARAX) 10 MG tablet, Take 1 tablet (10 mg total) by mouth 3 (three) times daily as needed., Disp: 30 tablet, Rfl: 0   lamoTRIgine (LAMICTAL) 200 MG tablet, Take 400 mg by mouth at  bedtime., Disp: , Rfl:    LINZESS 145 MCG CAPS capsule, Take 145 mcg by mouth daily., Disp: , Rfl:    loratadine (CLARITIN) 10 MG tablet, Take 1 tablet (10 mg total) by mouth daily., Disp: 90 tablet, Rfl: 1   Melatonin 10 MG TABS, Take 1 tablet by mouth at bedtime., Disp: , Rfl:    metFORMIN (GLUCOPHAGE-XR) 500 MG 24 hr tablet, TAKE 1 TABLET BY MOUTH DAILY  WITH BREAKFAST, Disp: 100 tablet, Rfl: 2   metoprolol succinate (TOPROL-XL) 25 MG 24 hr tablet, Take 1 tablet (25 mg total) by mouth daily. In place of Atenolol, Disp: 30 tablet, Rfl: 0   mycophenolate (MYFORTIC) 360 MG TBEC EC tablet, Take 720 mg by mouth 2 (two) times  daily., Disp: , Rfl:    olopatadine (PATANOL) 0.1 % ophthalmic solution, Place 1 drop into both eyes 2 (two) times daily., Disp: 5 mL, Rfl: 1   omeprazole (PRILOSEC) 40 MG capsule, Take 1 capsule by mouth in the morning and at bedtime. Pulmonologist, Disp: , Rfl:    polyethylene glycol powder (GLYCOLAX/MIRALAX) 17 GM/SCOOP powder, Take 17 g by mouth 2 (two) times daily as needed., Disp: 3350 g, Rfl: 1   predniSONE (DELTASONE) 2.5 MG tablet, Take 3 tablets by mouth daily at 12 noon., Disp: , Rfl:    pregabalin (LYRICA) 50 MG capsule, Take 50 mg by mouth 3 (three) times daily., Disp: , Rfl:    telmisartan (MICARDIS) 80 MG tablet, Take 1 tablet (80 mg total) by mouth daily., Disp: 90 tablet, Rfl: 3   traZODone (DESYREL) 100 MG tablet, Take 150 mg by mouth at bedtime., Disp: , Rfl:    triamcinolone ointment (KENALOG) 0.1 %, , Disp: , Rfl:    famotidine (PEPCID) 20 MG tablet, Take 20 mg by mouth daily., Disp: , Rfl:   Allergies  Allergen Reactions   Ace Inhibitors     Other reaction(s): OTHER Pt states she can not take ace inhibitors. Pt states she can not remember her reaction.    Bee Pollen    Penicillins    Pollen Extract     I personally reviewed active problem list, medication list, allergies, family history, social history, health maintenance with the patient/caregiver  today.   ROS  Constitutional: Negative for fever, positive for  weight change.  Respiratory: Negative for cough and shortness of breath.   Cardiovascular: Negative for chest pain or palpitations.  Gastrointestinal: Negative for abdominal pain, no bowel changes.  Musculoskeletal: Negative for gait problem or joint swelling.  Skin: Negative for rash.  Neurological: Negative for dizziness or headache.  No other specific complaints in a complete review of systems (except as listed in HPI above).   Objective  Vitals:   01/10/23 0834  BP: 122/74  Pulse: 70  Resp: 16  Temp: 98.4 F (36.9 C)  TempSrc: Oral  SpO2: 98%  Weight: 205 lb 14.4 oz (93.4 kg)  Height: 5\' 7"  (1.702 m)    Body mass index is 32.25 kg/m.  Physical Exam  Constitutional: Patient appears well-developed and well-nourished. Obese  No distress.  HEENT: head atraumatic, normocephalic, pupils equal and reactive to light, ears normal TM, neck supple, throat within normal limits Cardiovascular: Normal rate, regular rhythm and normal heart sounds.  No murmur heard. No BLE edema. Pulmonary/Chest: Effort normal and breath sounds normal. No respiratory distress. Abdominal: Soft.  There is no tenderness. Psychiatric: Patient has a normal mood and affect. behavior is normal. Judgment and thought content normal.    PHQ2/9:    01/10/2023    8:45 AM 10/24/2022    1:53 PM 10/12/2022    9:29 AM 08/23/2022   10:10 AM 08/11/2022    1:02 PM  Depression screen PHQ 2/9  Decreased Interest 1 2 2 1  0  Down, Depressed, Hopeless 0 2 2 1  0  PHQ - 2 Score 1 4 4 2  0  Altered sleeping 0 0 0 1 0  Tired, decreased energy 2 3 3 1  0  Change in appetite 1 3 3 1  0  Feeling bad or failure about yourself  1 2 2  0 0  Trouble concentrating 2 1 1  0 0  Moving slowly or fidgety/restless 1 0 0 0 0  Suicidal thoughts 0 0 0 0  0  PHQ-9 Score 8 13 13 5  0  Difficult doing work/chores Somewhat difficult Somewhat difficult Somewhat difficult Somewhat  difficult Not difficult at all    phq 9 is positive   Fall Risk:    01/10/2023    8:35 AM 10/24/2022    1:53 PM 10/12/2022    9:29 AM 08/23/2022   10:09 AM 08/11/2022    1:02 PM  Fall Risk   Falls in the past year? 0 0 0 0 0  Number falls in past yr:    0 0  Injury with Fall?    0 0  Risk for fall due to : No Fall Risks No Fall Risks No Fall Risks No Fall Risks   Follow up Falls prevention discussed;Education provided;Falls evaluation completed Falls prevention discussed Falls prevention discussed;Education provided;Falls evaluation completed Falls prevention discussed;Education provided;Falls evaluation completed       Functional Status Survey: Is the patient deaf or have difficulty hearing?: No Does the patient have difficulty seeing, even when wearing glasses/contacts?: No Does the patient have difficulty concentrating, remembering, or making decisions?: Yes Does the patient have difficulty walking or climbing stairs?: Yes Does the patient have difficulty dressing or bathing?: No Does the patient have difficulty doing errands alone such as visiting a doctor's office or shopping?: No    Assessment & Plan  1. Gammopathy  - Ambulatory referral to Hematology / Oncology  2. Unintentional weight loss  - Ambulatory referral to Hematology / Oncology Sed rate, CRP Recent labs reviewed with patient   3. Mild protein-calorie malnutrition (HCC)  Discussed protein intake, balanced diet

## 2023-01-10 ENCOUNTER — Ambulatory Visit (INDEPENDENT_AMBULATORY_CARE_PROVIDER_SITE_OTHER): Payer: 59 | Admitting: Family Medicine

## 2023-01-10 ENCOUNTER — Encounter: Payer: Self-pay | Admitting: Family Medicine

## 2023-01-10 VITALS — BP 122/74 | HR 70 | Temp 98.4°F | Resp 16 | Ht 67.0 in | Wt 205.9 lb

## 2023-01-10 DIAGNOSIS — E441 Mild protein-calorie malnutrition: Secondary | ICD-10-CM | POA: Diagnosis not present

## 2023-01-10 DIAGNOSIS — R634 Abnormal weight loss: Secondary | ICD-10-CM | POA: Diagnosis not present

## 2023-01-10 DIAGNOSIS — D472 Monoclonal gammopathy: Secondary | ICD-10-CM

## 2023-01-10 NOTE — Unmapped (Signed)
[facility] Triage Note    Reason for call: Return call    Time for incident: [id number]    Last seen in clinic on 08/30/22    Phone Assessment: Spoke with pt regarding call request.    Pt requesting to speak with S.McKenney,NP. She reports she has lost a lot of weight recently and she is concerned. I was 245 lbs in October and now I am 205 lbs, I told my primary care doctor and she said she is going to refer me to an oncologist but I would like to speak with Darl Pikes first.      Triage Recommendations: Msg forwarded to care team for recommendations.     Caller's Response: Thank you so much.     Outstanding tasks: Care team notified, no further action needed.

## 2023-01-10 NOTE — Unmapped (Signed)
Hi,     Xianna contacted the [employer] regarding the following:    - States that she would like an order to get lab work done, states that she is losing weight rapidly    Please contact Yaneli at [phone].    Thanks in advance,    Iona Hansen  [facility]   [phone]

## 2023-01-11 ENCOUNTER — Other Ambulatory Visit: Payer: Self-pay | Admitting: Family Medicine

## 2023-01-11 DIAGNOSIS — E441 Mild protein-calorie malnutrition: Secondary | ICD-10-CM

## 2023-01-11 DIAGNOSIS — D472 Monoclonal gammopathy: Secondary | ICD-10-CM

## 2023-01-11 DIAGNOSIS — R634 Abnormal weight loss: Secondary | ICD-10-CM

## 2023-01-11 LAB — SEDIMENTATION RATE: Sed Rate: 22 mm/h (ref 0–30)

## 2023-01-11 LAB — C-REACTIVE PROTEIN: CRP: <3 mg/L (ref <8.0)

## 2023-01-11 NOTE — Unmapped (Signed)
I was asked to call the patient to discuss her recent weight loss.  She has lost about 45 pounds.  I follow her for family history of breast cancer.  Explained to the patient that I am not her primary care doctor and that primary care doctors should be the first-line for diagnosing undiagnosed condition such as unexpected weight loss.  She tells me that she had a recent colonoscopy in February of this year that had polyps but no biopsy was recommended.  Patient should be discussed additional consults but did not think seeing an oncologist without a diagnosis of cancer would be prudent at this time.  The patient stated that she would be contacting her doctor to discuss this further.

## 2023-01-11 NOTE — Unmapped (Signed)
Reached out to patient as she expressed significant distress and anxiety in her mychart messaging to me.   Discussed her 40+ lb weight loss over past year, unintentionally.  Reports constipation. No blood in stool. Had colonoscopy in 2023 with a polyp removed  No recent endoscopy  She has been advised to see oncology by her PCP  Discussed with patient reasonable to also consider further GI work-up  Reviewed prior labs in November 2023 completed with rheumatology without signs for increased SLE acctivity or evidence for MM by SPEP and light chain study  She appreciated phone call

## 2023-01-12 ENCOUNTER — Telehealth: Payer: Self-pay

## 2023-01-12 MED ORDER — ATENOLOL 25 MG TABLET
ORAL_TABLET | 2 refills | 0 days
Start: 2023-01-12 — End: ?

## 2023-01-12 NOTE — Telephone Encounter (Signed)
Introductory phone call made to patient prior to her new patient appointment with Dr. Cathie Hoops on 01/13/23. Patient stated that she had address to [facility] but did not know exactly where it is located so I gave her directions. She thanked me for calling and looks forward to meeting [place] tomorrow.

## 2023-01-12 NOTE — Unmapped (Signed)
Pt called to report ongoing wt loss of 6 more pounds (unclear on timeframe of that weight loss based on our conversation).  She is concerned about cancer.  She has spoken to oncology (whom she has seen in the past due to family history of breast cancer) and to her PCP.  PCP now advised additional lab work, which she will do tomorrow. She reports she's spoken to her  Rheumatologist who reassured pt that her lab work every 6 months has looked reassuring for common causes of weight loss.  She had tried to schedule CTAP only at [facility], but has now agreed to schedule in [place] for a sooner appointment and will do CT on 5/16.  Reassured that colonoscopy looked good and she only had 1 polyp that was fully removed.  Advised that Nicholos Johns would review CT once she's had that scan.  Pt states understanding.

## 2023-01-13 ENCOUNTER — Inpatient Hospital Stay: Payer: 59 | Attending: Oncology | Admitting: Oncology

## 2023-01-13 ENCOUNTER — Encounter: Payer: Self-pay | Admitting: Oncology

## 2023-01-13 ENCOUNTER — Inpatient Hospital Stay: Payer: 59

## 2023-01-13 VITALS — BP 135/77 | HR 63 | Temp 97.1°F | Resp 18 | Wt 211.6 lb

## 2023-01-13 DIAGNOSIS — Z803 Family history of malignant neoplasm of breast: Secondary | ICD-10-CM | POA: Insufficient documentation

## 2023-01-13 DIAGNOSIS — R634 Abnormal weight loss: Secondary | ICD-10-CM | POA: Insufficient documentation

## 2023-01-13 DIAGNOSIS — R778 Other specified abnormalities of plasma proteins: Secondary | ICD-10-CM | POA: Diagnosis not present

## 2023-01-13 DIAGNOSIS — Z800 Family history of malignant neoplasm of digestive organs: Secondary | ICD-10-CM | POA: Diagnosis not present

## 2023-01-13 DIAGNOSIS — I129 Hypertensive chronic kidney disease with stage 1 through stage 4 chronic kidney disease, or unspecified chronic kidney disease: Secondary | ICD-10-CM

## 2023-01-13 DIAGNOSIS — E1122 Type 2 diabetes mellitus with diabetic chronic kidney disease: Secondary | ICD-10-CM | POA: Diagnosis not present

## 2023-01-13 DIAGNOSIS — N189 Chronic kidney disease, unspecified: Secondary | ICD-10-CM | POA: Diagnosis not present

## 2023-01-13 LAB — COMPREHENSIVE METABOLIC PANEL
ALT: 17 U/L (ref 0–44)
AST: 22 U/L (ref 15–41)
Albumin: 3.9 g/dL (ref 3.5–5.0)
Alkaline Phosphatase: 59 U/L (ref 38–126)
Anion gap: 11 (ref 5–15)
BUN: 16 mg/dL (ref 8–23)
CO2: 27 mmol/L (ref 22–32)
Calcium: 9.1 mg/dL (ref 8.9–10.3)
Chloride: 98 mmol/L (ref 98–111)
Creatinine, Ser: 0.88 mg/dL (ref 0.44–1.00)
GFR, Estimated: >60 mL/min (ref >60)
Glucose, Bld: 78 mg/dL (ref 70–99)
Potassium: 4.6 mmol/L (ref 3.5–5.1)
Sodium: 136 mmol/L (ref 135–145)
Total Bilirubin: 0.3 mg/dL (ref 0.3–1.2)
Total Protein: 7.9 g/dL (ref 6.5–8.1)

## 2023-01-13 LAB — CBC WITH DIFFERENTIAL/PLATELET
Abs Immature Granulocytes: 0.02 10*3/uL (ref 0.00–0.07)
Basophils Absolute: 0.1 10*3/uL (ref 0.0–0.1)
Basophils Relative: 2 %
Eosinophils Absolute: 0.1 10*3/uL (ref 0.0–0.5)
Eosinophils Relative: 2 %
HCT: 42.3 % (ref 36.0–46.0)
Hemoglobin: 13.1 g/dL (ref 12.0–15.0)
Immature Granulocytes: 1 %
Lymphocytes Relative: 41 %
Lymphs Abs: 1.6 10*3/uL (ref 0.7–4.0)
MCH: 27.8 pg (ref 26.0–34.0)
MCHC: 31 g/dL (ref 30.0–36.0)
MCV: 89.8 fL (ref 80.0–100.0)
Monocytes Absolute: 0.4 10*3/uL (ref 0.1–1.0)
Monocytes Relative: 11 %
Neutro Abs: 1.7 10*3/uL (ref 1.7–7.7)
Neutrophils Relative %: 43 %
Platelets: 294 10*3/uL (ref 150–400)
RBC: 4.71 MIL/uL (ref 3.87–5.11)
RDW: 13.2 % (ref 11.5–15.5)
WBC: 3.9 10*3/uL — ABNORMAL LOW (ref 4.0–10.5)
nRBC: 0 % (ref 0.0–0.2)

## 2023-01-13 LAB — LACTATE DEHYDROGENASE: LDH: 167 U/L (ref 98–192)

## 2023-01-13 MED ORDER — ATENOLOL 25 MG TABLET
ORAL_TABLET | 1 refills | 0 days | Status: CP
Start: 2023-01-13 — End: ?

## 2023-01-13 NOTE — Assessment & Plan Note (Addendum)
Will check multiple myeloma panel, light chain ratio.  LDH, flow cytometry, CBC CMP.

## 2023-01-13 NOTE — Assessment & Plan Note (Signed)
Refer to genetic counselor.  

## 2023-01-13 NOTE — Assessment & Plan Note (Signed)
Unintentional weight loss is possibly related to metformin. Today's weight has increased.  Continue monitor.

## 2023-01-13 NOTE — Progress Notes (Addendum)
Hematology/Oncology Consult note [phone] [phone]        REFERRING PROVIDER: Alba Cory, MD   CHIEF COMPLAINTS/REASON FOR VISIT:  Evaluation of unintentional weight loss, abnormal SPEP.   ASSESSMENT & PLAN:   Abnormal SPEP Will check multiple myeloma panel, light chain ratio.  LDH, flow cytometry, CBC CMP.  Weight loss Unintentional weight loss is possibly related to metformin. Today's weight has increased.  Continue monitor.  Family history of breast cancer Refer to genetic counselor   Orders Placed This Encounter  Procedures   Multiple Myeloma Panel (SPEP&IFE w/QIG)    Standing Status:   Future    Number of Occurrences:   1    Standing Expiration Date:   01/13/2024   Kappa/lambda light chains    Standing Status:   Future    Number of Occurrences:   1    Standing Expiration Date:   01/13/2024   Flow cytometry panel-leukemia/lymphoma work-up    Standing Status:   Future    Number of Occurrences:   1    Standing Expiration Date:   01/13/2024   Lactate dehydrogenase    Standing Status:   Future    Number of Occurrences:   1    Standing Expiration Date:   01/13/2024   CBC with Differential/Platelet    Standing Status:   Future    Number of Occurrences:   1    Standing Expiration Date:   01/13/2024   Comprehensive metabolic panel    Standing Status:   Future    Number of Occurrences:   1    Standing Expiration Date:   01/13/2024   Ambulatory referral to Genetics    Referral Priority:   Routine    Referral Type:   Consultation    Referral Reason:   Specialty Services Required    Number of Visits Requested:   1   Follow-up in few weeks to discuss results. All questions were answered. The patient knows to call the clinic with any problems, questions or concerns.  Rickard Patience, MD, PhD [facility] Hematology Oncology 01/13/2023   HISTORY OF PRESENTING ILLNESS:   Nicole Ballard is a  67 y.o.  female with PMH listed below was seen in  consultation at the request of  Alba Cory, MD  for evaluation of unintentional weight loss, abnormal SPEP.  Patient reports a 50-month history of weight loss of 40 pounds.  Has not changed her diet or activity level.  Denies any night sweats, fever.  TSH has been checked and was normal. She takes metformin for about a year.  She walks 1 hour/day for about a year.  Chronic constipation.  She has family history significant for breast cancer as well as stomach cancer.  She follows up with [facility] high [facility] and gets mammogram done.  Never had genetic testing.  08/25/2022, CT abdomen pelvis without contrast showed trace free fluid in the pelvis of uncertain etiology.  Large amount of stool throughout the colon.  Colonic diverticulosis without evidence of dactylitis. Report colonoscopy in 2023.  08/26/2022, at [facility], she had some blood work done. SPEP showed slight increase of gammaglobulin level at 1.7.  SPE pattern demonstrate the irregularity and increased in the gamma region.  Which may represent a monoclonal protein.  Light chain ratio is slightly elevated 2.  Kappa light chain 3.2.   MEDICAL HISTORY:  Past Medical History:  Diagnosis Date   Anxiety    Bipolar 1 disorder (HCC)    Chronic  kidney disease    Chronic pain    Diabetes mellitus without complication (HCC)    Fibromyalgia    GERD (gastroesophageal reflux disease)    Hearing loss of right ear    Hemifacial spasm    Hyperlipidemia    Hypertension    Insomnia    Lumbago    Lung disease    Osteoarthritis    Paresthesia    Rhinitis    Systemic lupus erythematosus (HCC)    Vitamin D deficiency     SURGICAL HISTORY: Past Surgical History:  Procedure Laterality Date   BRAIN SURGERY     CHOLECYSTECTOMY     VAGINAL HYSTERECTOMY     vulvar surgery      SOCIAL HISTORY: Social History   Socioeconomic History   Marital status: Divorced    Spouse name: Not on file   Number of children: 2   Years of education: Not on  file   Highest education level: GED or equivalent  Occupational History   Occupation: Disability  Tobacco Use   Smoking status: Never   Smokeless tobacco: Never  Vaping Use   Vaping Use: Never used  Substance and Sexual Activity   Alcohol use: No    Alcohol/week: 0.0 standard drinks of alcohol   Drug use: No   Sexual activity: Not Currently    Partners: Male  Other Topics Concern   Not on file  Social History Narrative   Pt lives alone   Social Determinants of Health   Financial Resource Strain: Low Risk  (08/19/2021)   Overall Financial Resource Strain (CARDIA)    Difficulty of Paying Living Expenses: Not hard at all  Food Insecurity: No Food Insecurity (01/13/2023)   Hunger Vital Sign    Worried About Running Out of Food in the Last Year: Never true    Ran Out of Food in the Last Year: Never true  Transportation Needs: No Transportation Needs (08/19/2021)   PRAPARE - Administrator, Civil Service (Medical): No    Lack of Transportation (Non-Medical): No  Physical Activity: Sufficiently Active (08/19/2021)   Exercise Vital Sign    Days of Exercise per Week: 5 days    Minutes of Exercise per Session: 30 min  Stress: Stress Concern Present (08/19/2021)   [employer] of Occupational Health - Occupational Stress Questionnaire    Feeling of Stress : To some extent  Social Connections: Moderately Integrated (08/19/2021)   Social Connection and Isolation Panel [NHANES]    Frequency of Communication with Friends and Family: More than three times a week    Frequency of Social Gatherings with Friends and Family: Once a week    Attends Religious Services: More than 4 times per year    Active Member of [employer] or Organizations: Yes    Attends Engineer, structural: More than 4 times per year    Marital Status: Divorced  Intimate Partner Violence: Not At Risk (01/13/2023)   Humiliation, Afraid, Rape, and Kick questionnaire    Fear of Current or Ex-Partner:  No    Emotionally Abused: No    Physically Abused: No    Sexually Abused: No    FAMILY HISTORY: Family History  Problem Relation Age of Onset   Diabetes Mother    Hypertension Mother    Breast cancer Mother    Stomach cancer Father    Breast cancer Maternal Aunt    Breast cancer Maternal Aunt    Breast cancer Maternal Aunt     ALLERGIES:  is allergic to ace inhibitors, bee pollen, penicillins, and pollen extract.  MEDICATIONS:  Current Outpatient Medications  Medication Sig Dispense Refill   albuterol (VENTOLIN HFA) 108 (90 Base) MCG/ACT inhaler SMARTSIG:2 Puff(s) By Mouth Every 6 Hours PRN     amLODipine (NORVASC) 2.5 MG tablet Take 3 tablets (7.5 mg total) by mouth daily. 270 tablet 3   aspirin EC 81 MG tablet Take 1 tablet (81 mg total) by mouth daily. 30 tablet 0   atenolol (TENORMIN) 25 MG tablet Take 1 tablet by mouth in the morning and at bedtime.     atorvastatin (LIPITOR) 40 MG tablet Take 1 tablet (40 mg total) by mouth daily. 90 tablet 3   Biotin 1 MG CAPS Take 1 capsule by mouth daily. 30 capsule 0   buPROPion (WELLBUTRIN XL) 300 MG 24 hr tablet Take 300 mg by mouth in the morning.     Cholecalciferol (VITAMIN D) 2000 UNITS CAPS Take 1 capsule by mouth daily.     clobetasol ointment (TEMOVATE) 0.05 % Apply 1 application topically 2 (two) times daily.     clonazePAM (KLONOPIN) 0.5 MG tablet Take 0.25 mg by mouth at bedtime.     clotrimazole-betamethasone (LOTRISONE) cream Apply 1 application topically 2 (two) times daily. 45 g 0   cyclobenzaprine (FLEXERIL) 5 MG tablet Take by mouth.     diclofenac Sodium (VOLTAREN) 1 % GEL Apply topically daily as needed.     famotidine (PEPCID) 20 MG tablet Take 20 mg by mouth daily.     fluticasone (FLONASE) 50 MCG/ACT nasal spray Place 2 sprays into both nostrils daily. 48 g 3   hydroxychloroquine (PLAQUENIL) 200 MG tablet Take by mouth 2 (two) times daily.     hydrOXYzine (ATARAX) 10 MG tablet Take 1 tablet (10 mg total) by  mouth 3 (three) times daily as needed. 30 tablet 0   lamoTRIgine (LAMICTAL) 200 MG tablet Take 400 mg by mouth at bedtime.     LINZESS 145 MCG CAPS capsule Take 145 mcg by mouth daily.     loratadine (CLARITIN) 10 MG tablet Take 1 tablet (10 mg total) by mouth daily. 90 tablet 1   Melatonin 10 MG TABS Take 1 tablet by mouth at bedtime.     metFORMIN (GLUCOPHAGE-XR) 500 MG 24 hr tablet TAKE 1 TABLET BY MOUTH DAILY  WITH BREAKFAST 100 tablet 2   mycophenolate (MYFORTIC) 360 MG TBEC EC tablet Take 720 mg by mouth 2 (two) times daily.     olopatadine (PATANOL) 0.1 % ophthalmic solution Place 1 drop into both eyes 2 (two) times daily. 5 mL 1   omeprazole (PRILOSEC) 40 MG capsule Take 1 capsule by mouth in the morning and at bedtime. Pulmonologist     polyethylene glycol powder (GLYCOLAX/MIRALAX) 17 GM/SCOOP powder Take 17 g by mouth 2 (two) times daily as needed. 3350 g 1   predniSONE (DELTASONE) 2.5 MG tablet Take 2 tablets by mouth daily at 12 noon.     pregabalin (LYRICA) 50 MG capsule Take 50 mg by mouth 3 (three) times daily.     telmisartan (MICARDIS) 80 MG tablet Take 1 tablet (80 mg total) by mouth daily. 90 tablet 3   traZODone (DESYREL) 100 MG tablet Take 150 mg by mouth at bedtime.     triamcinolone ointment (KENALOG) 0.1 %      metoprolol succinate (TOPROL-XL) 25 MG 24 hr tablet Take 1 tablet (25 mg total) by mouth daily. In place of Atenolol (Patient not taking: Reported on  01/13/2023) 30 tablet 0   No current facility-administered medications for this visit.    Review of Systems  Constitutional:  Positive for unexpected weight change. Negative for appetite change, chills, fatigue and fever.  HENT:   Negative for hearing loss and voice change.   Eyes:  Negative for eye problems.  Respiratory:  Negative for chest tightness and cough.   Cardiovascular:  Negative for chest pain.  Gastrointestinal:  Negative for abdominal distention, abdominal pain and blood in stool.  Endocrine:  Negative for hot flashes.  Genitourinary:  Negative for difficulty urinating and frequency.   Musculoskeletal:  Negative for arthralgias.  Skin:  Negative for itching and rash.  Neurological:  Negative for extremity weakness.  Hematological:  Negative for adenopathy.  Psychiatric/Behavioral:  Negative for confusion.    PHYSICAL EXAMINATION: ECOG PERFORMANCE STATUS: 0 - Asymptomatic Vitals:   01/13/23 1046  BP: 135/77  Pulse: 63  Resp: 18  Temp: (!) 97.1 F (36.2 C)   Filed Weights   01/13/23 1046  Weight: 211 lb 9.6 oz (96 kg)    Physical Exam Constitutional:      General: She is not in acute distress. HENT:     Head: Normocephalic and atraumatic.  Eyes:     General: No scleral icterus. Cardiovascular:     Rate and Rhythm: Normal rate and regular rhythm.     Heart sounds: Normal heart sounds.  Pulmonary:     Effort: Pulmonary effort is normal. No respiratory distress.     Breath sounds: No wheezing.  Abdominal:     General: Bowel sounds are normal. There is no distension.     Palpations: Abdomen is soft.  Musculoskeletal:        General: No deformity. Normal range of motion.     Cervical back: Normal range of motion and neck supple.  Skin:    General: Skin is warm and dry.     Findings: No erythema or rash.  Neurological:     Mental Status: She is alert and oriented to person, place, and time. Mental status is at baseline.     Cranial Nerves: No cranial nerve deficit.     Coordination: Coordination normal.  Psychiatric:        Mood and Affect: Mood normal.     LABORATORY DATA:  I have reviewed the data as listed    Latest Ref Rng & Units 01/13/2023   11:18 AM 10/24/2022    3:11 PM 04/13/2022   10:28 PM  CBC  WBC 4.0 - 10.5 K/uL 3.9  5.2  10.4   Hemoglobin 12.0 - 15.0 g/dL 16.1  09.6  04.5   Hematocrit 36.0 - 46.0 % 42.3  42.9  44.0   Platelets 150 - 400 K/uL 294  324  363       Latest Ref Rng & Units 01/13/2023   11:18 AM 10/24/2022    3:11 PM 04/13/2022    10:28 PM  CMP  Glucose 70 - 99 mg/dL 78  61  409   BUN 8 - 23 mg/dL 16  12  13    Creatinine 0.44 - 1.00 mg/dL 8.11  9.14  7.82   Sodium 135 - 145 mmol/L 136  143  141   Potassium 3.5 - 5.1 mmol/L 4.6  4.6  3.8   Chloride 98 - 111 mmol/L 98  104  104   CO2 22 - 32 mmol/L 27  30  29    Calcium 8.9 - 10.3 mg/dL 9.1  9.8  9.2   Total Protein 6.5 - 8.1 g/dL 7.9  7.5    Total Bilirubin 0.3 - 1.2 mg/dL 0.3  0.4    Alkaline Phos 38 - 126 U/L 59     AST 15 - 41 U/L 22  16    ALT 0 - 44 U/L 17  14        RADIOGRAPHIC STUDIES: I have personally reviewed the radiological images as listed and agreed with the findings in the report. No results found.

## 2023-01-13 NOTE — Unmapped (Signed)
Refill request received for patient.      Medication Requested: atenolol  Last Office Visit: 08/03/2022   Next Office Visit: Visit date not found  Last Prescriber: Phineas Inches    Nurse refill requirements met? Yes  If not met, why:     Sent to: Pharmacy per protocol  If sent to provider, which provider?:

## 2023-01-16 LAB — COMP PANEL: LEUKEMIA/LYMPHOMA

## 2023-01-16 LAB — KAPPA/LAMBDA LIGHT CHAINS
Kappa free light chain: 36.3 mg/L — ABNORMAL HIGH (ref 3.3–19.4)
Kappa, lambda light chain ratio: 1.8 — ABNORMAL HIGH (ref 0.26–1.65)
Lambda free light chains: 20.2 mg/L (ref 5.7–26.3)

## 2023-01-17 ENCOUNTER — Telehealth: Payer: Self-pay

## 2023-01-17 NOTE — Telephone Encounter (Signed)
Patient presented to office with confusion on how to read her pulse oximeter and what normal readings were. She stated she was advised by her pulmonologist to seek emergency care if O2 reading was greater than 87 however I explained this was a mix up and if it were below 90/87 that is when she would need to seek care. She verbalized understanding and appreciation for the clarification/education on monitoring. She had no further questions or concerns at time of clinic departure but agreed to reach out to [place] if any arise.

## 2023-01-18 ENCOUNTER — Telehealth: Payer: Self-pay

## 2023-01-18 NOTE — Telephone Encounter (Signed)
Left voicemail to let patient know we had received a letter from Dr. Leeanne Deed [facility]) that stated they did not need to see/evaluate her as she had been recently seen by Dr. Cathie Hoops and they were in agreement with the evaluation/treatment plan that was established at this time. Referral was cancelled on their end as a result and if patient had any questions or concerns she could contact them at: [phone] going forward.

## 2023-01-21 ENCOUNTER — Ambulatory Visit: Admit: 2023-01-21 | Discharge: 2023-01-22 | Payer: MEDICARE

## 2023-01-21 DIAGNOSIS — R634 Abnormal weight loss: Secondary | ICD-10-CM | POA: Diagnosis not present

## 2023-01-21 DIAGNOSIS — K5900 Constipation, unspecified: Secondary | ICD-10-CM | POA: Diagnosis not present

## 2023-01-21 MED ADMIN — iohexol (OMNIPAQUE) 350 mg iodine/mL solution 100 mL: 100 mL | INTRAVENOUS | @ 17:00:00 | Stop: 2023-01-21

## 2023-01-23 DIAGNOSIS — F32A Depressive disorder: Principal | ICD-10-CM

## 2023-01-23 DIAGNOSIS — F411 Generalized anxiety disorder: Principal | ICD-10-CM

## 2023-01-23 LAB — MULTIPLE MYELOMA PANEL, SERUM
Albumin SerPl Elph-Mcnc: 3.7 g/dL (ref 2.9–4.4)
Alpha2 Glob SerPl Elph-Mcnc: 0.6 g/dL (ref 0.4–1.0)
Total Protein ELP: 7.3 g/dL (ref 6.0–8.5)

## 2023-01-23 MED ORDER — LAMOTRIGINE 200 MG TABLET
ORAL_TABLET | 2 refills | 0 days
Start: 2023-01-23 — End: ?

## 2023-01-24 LAB — MULTIPLE MYELOMA PANEL, SERUM
Albumin/Glob SerPl: 1.1 (ref 0.7–1.7)
Alpha 1: 0.3 g/dL (ref 0.0–0.4)
B-Globulin SerPl Elph-Mcnc: 1 g/dL (ref 0.7–1.3)
Gamma Glob SerPl Elph-Mcnc: 1.7 g/dL (ref 0.4–1.8)
Globulin, Total: 3.6 g/dL (ref 2.2–3.9)
IgA: 392 mg/dL — ABNORMAL HIGH (ref 87–352)
IgG (Immunoglobin G), Serum: 1627 mg/dL — ABNORMAL HIGH (ref 586–1602)
IgM (Immunoglobulin M), Srm: 148 mg/dL (ref 26–217)

## 2023-01-24 MED ORDER — LAMOTRIGINE 200 MG TABLET
ORAL_TABLET | 2 refills | 0 days | Status: CP
Start: 2023-01-24 — End: ?

## 2023-01-24 NOTE — Unmapped (Signed)
Pulmonary Clinic - Follow-up Visit      HISTORY:     Active Pulmonary Problems & Brief History:  Caitlin Smith is a 67 y.o. woman with many medical issues here for follow up of follow up for organizing pneumonia.     Interval History:  - 2016 and prior - patient followed at [facility]. They thought her symptoms of dyspnea were from obesity and asthma and recommended ENT evaluation. They also stated concern for chronic aspiration.   - 04/2016 - patient in [facility] system. She was thought to have multifactorial dyspnea, waxing and waning pulmonary opacities. Evaluated by pulmonary hypertension team without evidence of pulmonary hypertension. Underwent bronchoscopy with BAL and transbronchial biopsies which showed significant inflammation. DDX remained broad - CEP, COP, SLE pneumonitis, ABPA, EGPA, AEP, but it was decided to treat her with prolonged steroid taper.   - 08/23/16 - last visit with Dr. Johnnette Barrios - was improving with steroid taper   - 10/20/16 - established with Dr. Nilsa Nutting - there had been question of compliance with her prednisone since last visit, but by time of visit she had been on it consistently for one month. No clear improvement in dyspnea, but remains inactive.   - 02/09/17 - follow up - spirometry showed improvement in FVC and DLCOafter 2 months of steroid, had tapered off by time of visit. When she stopped the prednisone she developed increased cough, chest tightness, dyspnea.   - 03/27/17 - follow up - given steroid dependence was placed on MMF.   - 10/16/17 - follow up - dose reduced MMF for GI side effects. Prolonged steroid taper over this time.   - 02/06/2019 - last visit with pulmonary, Dr. Judeth Horn - at that time had gone to local ED for chest pain, CT chest showed interstitial lung disease in the lung bases, favored to reflect mild progression of chronic fibrotic nonspecific interstitial pneumonia (NSIP). It was unclear to Dr. Judeth Horn if that represented actual progression of her underlying disease.   - 03/03/21 - initial visit with me - DOE w/o chest pain. No inhalers. Chronic dry cough thought to be from reflux.  - 06/08/21 - ENT - larynx healthy on laryngoscopy. Referred to GI for GI discomfort and constipation.   - 07/04/21 - follow up - exercise increased to 50 minutes daily, acute worsening 2 weeks prior, evaluation with CT chest, echocardiogram, unremarkable. Chest fluoroscopy with decreased, but normal, movement of left hemidiaphragm.   - 09/13/20 - follow up - had URI over Christmas and New Year treat with doxycycline and followed with lingering hoarseness, productive cough. Increased MMF.   - 12/29/21 - follow up - very easily dyspneic, even just walking to the car or climbing 12 stairs into house. Less active overall. Not as much coughing, allergies are improved. No heartburn with acid reflux medication.   - 02/16/22 - phone visit - PFTs improved from April, continued pred 10mg  daily, MMF 720 BID, HCQ 200mg  BID.   - 04/20/22 - follow up - some palpitations, some dyspnea, some dry cough associated with nausea and dizziness. No clubbing or LE edema. MMF 720mg  BID, HCQ 200mg  BID, prednisone 7.5mg  daily.   - 06/09/22 - ENT - PPI ineffective for cough.Trial of right SLN injection for neurogenic cough.  - 09/12/21 - follow up - doing pulmonary rehab, exercising 5 days a week, walking an hour a day, significant cough worst at night. No changes to treatment.   - 10/19/22 - follow up - nerve injection significantly helped  cough. Exercising without difficulty. DOE climbing stairs.   - 01/25/23 - follow up - significant anxiety about unintentional 40 pound weight loss. Some dyspnea with faster walking, climbing stairs. Not worse than before or she would've called me. Still ongoing cough that returned a month after the injection. Notices with walking always pulls to the left side. Is able to walk an hour every morning, 5 days per week. Unsteady on her feet. Only going on a month, not getting worse or better. Some nausea, more related to constipation. No diarrhea. Taking 5mg  daily of prednisone (at night), mycophenolate 720mg  BID, HCQ 200mg  BID.       Past Medical History: The medical and surgical history were personally reviewed and updated in the patient's electronic medical record. Pertinent positives are documented above.  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 Pertinent positives are documented above.  Home Medications: Medications were reviewed and updated in the patient's electronic medical record. Pertinent positives are documented above.   Allergies: Allergies were reviewed and updated in the patient's electronic medical record. Pertinent positives are documented above.  Review of Systems: A comprehensive review of systems was completed and negative except as noted in HPI.    PHYSICAL EXAM:     Vitals:    01/25/23 0855   BP: 136/73   Pulse: 59   Temp: 36.2 ??C (97.2 ??F)   SpO2: 98%       General: pleasant individual appearing stated age, alert and oriented, no acute distress  HEENT: trachea midline, supple  CV: RRR, systolic ejection murmur.   Lungs: CTAB, good air movement throughout   Abd: Soft, NT/ND, no rebound or guarding  Ext: Warm, well perfused, no peripheral edema  Skin: No rashes, skin breakdown, or wounds  Neuro: CN II-XII intact to conversation, alert and oriented. No dysdiadochokinesis, no issues with heel to shin. Some mild issues with finger to nose.     LABORATORY and RADIOLOGY DATA: Pulmonary Function Tests/Interpretation:  10/19/2022       08/03/22      Pertinent Laboratory Data:  02/08/22 - Hg 13.4, absolute lymphocyte 3.8    04/25/16 BAL and TBBX   Cultures negative except for OPF     Cell count  PMN 31%  Lymph 11%  Macs 43%  Eos 7%  Baso 6%     Pathology   A: Lung, right lower lobe, endobronchial biopsy   - DIP-like reaction pattern with admixed eosinophils, consistent with eosinophilic pneumonia (see Micro Exam)  - Patchy organizing pneumonia  - Interstitial lymphoplasmacytic infiltrate, polytypic  - Partially denuded benign respiratory mucosa, without viral cytopathic effect  - No malignant neoplasm identified   - GMS and  AFB stains negative for fungi or AFB    Cytology  Lung, Bronchial lavage:  - No malignant cells identified.   - Alveolar macrophages and mixed inflammation.     Pertinent Imaging Data:  09/15/2022 Barium Swallow Double Contrast  Esophageal dysmotility.   Gastroesophageal reflux.   Elevation of the left hemidiaphragm.       06/29/22 CXR  Hypoinflated lungs with no acute abnormalities.     08/02/21 HRCT Chest  Slight worsening of fibrotic changes related to  interstitial lung disease associated with connective tissue disease.       Resolution of right lower lobe consolidation with central clearance consistent with organizing pneumonia.     08/26/21 Sniff Test  Elevated left hemidiaphragm with slightly diminished excursion relative to the right but no diaphragmatic paralysis.    Pertinent Cardiac Data:  05/26/22 Zio   Patient had a min HR of 52 bpm, max HR of 123 bpm, and avg HR of 67 bpm. Predominant underlying rhythm was Sinus Rhythm. Isolated SVEs were rare (<1.0%), and no SVE Couplets or SVE Triplets were present. Isolated VEs were rare (<1.0%), and no VE Couplets or VE Triplets were present.   Symptoms were associated with sinus rhythm and also isolated VEs.     08/27/21 TTE     1. The left ventricular systolic function is normal, LVEF is visually estimated at 60-65%.    2. The right ventricle is normal in size, with normal systolic function.    3. The pulmonary systolic pressure cannot be estimated due to insufficient TR signal.      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 Caitlin Smith is an older woman with CTD-ILD in the setting of known SLE who has done well over the past several years on prednisone, mycophenolate, and hydroxychloroquine. We are currently working on weaning off of prednisone to prevent complications and side effects.     Prednisone taper of 4mg  daily for 1 month, 3mg  daily for 1 month, 2mg  daily for 1 month, 1mg  daily for 1 month, and then off. PFTs in the next month.     For her unsteadiness of gait she will discuss with her neurologist at her 6/11 appointment.     Plan  - Continue mycophenolate 720mg  BID, HCQ 200mg  BID    The patient was seen with Dr. Stasia Cavalier and will return to clinic in 6 months.    Layla Maw MD  Pulmonary and Critical Care Fellow

## 2023-01-25 ENCOUNTER — Ambulatory Visit: Admit: 2023-01-25 | Discharge: 2023-01-26 | Payer: MEDICARE

## 2023-01-25 DIAGNOSIS — R2681 Unsteadiness on feet: Secondary | ICD-10-CM | POA: Diagnosis not present

## 2023-01-25 DIAGNOSIS — D849 Immunodeficiency, unspecified: Secondary | ICD-10-CM | POA: Diagnosis not present

## 2023-01-25 DIAGNOSIS — J8489 Other specified interstitial pulmonary diseases: Secondary | ICD-10-CM | POA: Diagnosis not present

## 2023-01-25 MED ORDER — PREDNISONE 1 MG TABLET
ORAL_TABLET | ORAL | 0 refills | 120 days | Status: CP
Start: 2023-01-25 — End: 2023-05-25

## 2023-01-25 NOTE — Unmapped (Addendum)
Hi [name],    It was great meeting you today. Here is what we talked about.    1) Cryptogenic Organizing Pneumonia   - We're going to keep the mycophenolate and hydroxychloroquine the same  - We're going to wean prednisone - starting by dropping from 5mg  daily to 4mg  daily (new tablets). We'll do each dose for 30 days, then decrease. Watch for symptoms of Adrenal Insufficiency (AI) which are increasing nausea, vomiting, lightheadedness, dizziness, abdominal pain. If those appear or worsen, go back to your previous dose and let me know.   - I will be at the [facility] starting 05/07/23.   Address: [address] [place], [place] [address]  Phone: [phone]    - PFTs at [facility] in 1 month     2) Unsteadiness - talk with your neurologist at your June 11th appointment     Please return to clinic in 6 months.     If you have any non-urgent questions or concerns please reach out by calling the clinic or via MyChart.    Thanks,  Layla Maw MD  Pulmonary and Critical Care Fellow

## 2023-01-26 ENCOUNTER — Ambulatory Visit: Admit: 2023-01-26 | Discharge: 2023-01-27 | Payer: MEDICARE

## 2023-01-26 NOTE — Unmapped (Signed)
Assessment:     Problem List Items Addressed This Visit    None  Visit Diagnoses       Encounter for gynecological examination without abnormal finding    -  Primary            Unremarkable exam      Plan:   >Continue PFPT to improve pubococcygeus muscle tone   > Discussed devices; may be a candidate for CBT, sex therapis   > Education materials provided re: healthy lifestyle practices    I personally spent 30 minutes face-to-face and non-face-to-face in the care of this patient, which includes all pre, intra, and post visit time on the date of service.  All documented time was specific to the E/M visit and does not include any procedures that may have been performed.        Subjective:      Caitlin Smith is a 67 y.o. female who presents for annual exam. H/O VIN II, lichen simplex chronicus. S/P WLE, hysterectomy.  The patient has no complaints today. The patient is not sexually active. GYN screening history: last mammogram: approximate date 12/23 and was normal. The patient is not taking hormone replacement therapy.  The patient is not incontinent of urine--urgency.  Family history of uterine or ovarian cancer: no  Family history of breast cancer: yes--mother, mat aunt x 3, cousins.  30 - 40 lb unintentional weight loss over the past 5 mos--felt to be stress related.. Being treated for depression, IBS.  Screening colonoscopy UTD.   Normal DEXA in 8/23.  Last coitus remote, approximately 15 yrs ago.  The experience was not satisfying--reports loss of feeling.  She performs Kegel exercises.  Currently seeing PFPT.   Less sexual dysfunction prior to hysterectomy.  No h/o sexual assault.      Review of prior data:  CTAP 5/24:  REPRODUCTIVE ORGANS: The uterus is surgically absent. No adnexal masses.   The following portions of the patient's history were reviewed and updated as appropriate: allergies, current medications, past family history, past medical history, past social history, past surgical history, and problem list.    Review of Systems  Constitutional: negative for fatigue and weight loss  Respiratory: negative for cough and wheezing  Cardiovascular: negative for chest pain, fatigue and palpitations  Gastrointestinal: negative for abdominal pain and change in bowel habits  Genitourinary:negative for abnormal discharge  Integument/breast: negative for nipple discharge  Musculoskeletal:negative for myalgias  Neurological: negative for gait problems and tremors  Behavioral/Psych: negative for abusive relationship, depression  Endocrine: negative for temperature intolerance         Objective:     BP 156/82  - Pulse 62  - Wt 94.8 kg (209 lb)  - BMI 32.73 kg/m??    The sensitive parts of the examination were performed with a chaperone.   Constitutional: Well-developed, well-nourished female in no acute distress  Neurological: Alert and oriented to person, place, and time  Psychiatric: Mood and affect appropriate  Skin: No rashes or lesions  Neck: Supple without masses. Trachea is midline.Thyroid is normal size without masses  Lymphatics: No cervical, axillary, supraclavicular, or inguinal adenopathy noted  Respiratory: Clear to auscultation bilaterally. Good air movement with normal work of breathing.  Cardiovascular: Regular rate and rhythm. Extremities grossly normal, nontender with no edema; pulses regular  Gastrointestinal: Soft, nontender, nondistended. No masses or hernias appreciated. No hepatosplenomegaly. No fluid wave. No rebound or guarding.  Breast Exam: No tenderness, masses, or nipple abnormality  Genitourinary:  External Genitalia: Normal female genitalia     Urethral Meatus: Normal caliber and position     Urethra: Midline, no masses     Bladder: Well-suspended, NT     Vagina: Hypoestrogenic, no lesions.  Low Brinks; normal LA tone     Adnexa/Parametria: No masses; no parametrial nodularity; no tenderness  Perineum/Anus: No lesions

## 2023-01-26 NOTE — Unmapped (Signed)
Caitlin Smith

## 2023-02-01 ENCOUNTER — Inpatient Hospital Stay: Payer: 59 | Admitting: Licensed Clinical Social Worker

## 2023-02-01 ENCOUNTER — Inpatient Hospital Stay: Payer: 59

## 2023-02-02 ENCOUNTER — Telehealth
Admit: 2023-02-02 | Discharge: 2023-02-03 | Payer: MEDICARE | Attending: Student in an Organized Health Care Education/Training Program | Primary: Student in an Organized Health Care Education/Training Program

## 2023-02-02 NOTE — Unmapped (Signed)
[facility]  Psychiatry   Established Patient E&M Service - Outpatient     Assessment:  Caitlin Smith has been stable. She reports that her insurance has been adjusted correctly. She has no major concerns. Looking forward to spending time with her family. Reviewed medications. No concerns at this time.       Plan   [ ]  Plan for follow up in September.   [ ]  No medication changes.       Identifying Information:  Caitlin Smith is a 67 y.o. female with a history of  MDD, GAD, and Panic Disorder in the context of many chronic medical conditions including chronic pain/fibromyalgia, osteoarthritis with degenerative disc disease, Lupus, pituitary tumor (benign), s/p right total knee arthroplasty, and VIN II (s/p resection), who presents for follow-up evaluation. She has been maintained on Lamictal for many years as well as cymbalta, trazodone, klonopin, and wellbutrin. Patient has previously tolerated a slight taper in her klonopin from 0.75 mg to 0.25 mg qHS overtime. Her mood is often exacerbated by steroid use for current treatment of pulmonary disease. Patient is engaged with CBT therapy at the pain clinic.    Past medications:  Cymbalta  Effexor     Risk Assessment:  An assessment of suicide and violence risk factors was performed as part of this evaluation and is not significantly changed from the last visit. While future psychiatric events cannot be accurately predicted, the patient does not currently require acute inpatient psychiatric care and does not currently meet [facility] involuntary commitment criteria.      Plan:    Problem: Major depression, Anxiety NOS, Panic Disorder   Status of problem: chronic with mild exacerbation  Interventions:   -- Continue Lamictal 400mg  qhs for mood  -- Continue Clonazepam 0.25mg  at bedtime,   -- Continue CBT at pain clinic    Problem: Insomnia   Status of problem: chronic and stable  Interventions:   Last sleep study performed in 2017. Per chart review, showed mild OSA likely related to medication use. Neurology recommends patient avoid sleeping on back as she likely has OSA when sleeping in this position  -- Continue melatonin 3-5mg  at bedtime   -- Continue trazodone 150mg  qHS for insomnia (d5/4/21, d 02/05/20, i9/8,)  -- Clonazepam per above    Problem: High Risk Medication monitoring   Status of problem:  Stable   Interventions:   The complexity of this patient's care involves drug therapy which requires intensive monitoring for toxicity. This is in part due to a narrow therapeutic window, as well as potential for toxicity. This patient is being treated with Lamotrigine (Lamictal) to target their psychiatric disorder, Major depressive disorder, refractory. In regards to this medication being considered high risk, Lamotrigine (lamictal) has black box warning for serious rash including fatal reaction of Stevens-Johnson syndrome and rare cases of toxic epidermal necrolysis. In addition to prolonged titration to mitigate risk of serious rash, lamotrigine requires monitoring of hepatic and renal function at baseline, and at times will require monitoring of lamotrigine blood levels.    Problem: Mild Major Neurocognitive Disorder,   Status of problem:  new problem to this provider  Interventions:  Patient seen by Neurology ([facility]) 05/22/18 who dx pt with mild neurocognitive impairment w/ deficits in inattention and visual-spatial function with suspicion that sleep apnea and polypharmacy may be contributing to her difficulties. Recommended that psychopharm regimen be simplified, including eliminating trazodone and considering changing Wellbutrin to Zoloft or Lexapro for anxiety  and eliminating Klonopin when stable.   - However, it is very likely that patient's lupus is significantly contributing to cognitive decline, which will limit improvement she will see with reduction in polypharmacy  -- Last MOCA 22 on 12/12/2017  -- Need to repeat MOCA. Problem: Polypharmacy   Status of problem:  chronic and stable  Interventions:  -- may be contributing to neurocognitive disorder   -- will consider trazodone and klonopin taper     Lamotrigine Monitoring  - Hepatic function testing, platelets (via CBC) q6 months (06/2022)   - AST/ALT WNL     Effexor   --Impaired renal function can decrease clearance of effexor  -- most recent CrCl 89   -- will continue to monitor along with PCP given dx lupus    Psychotherapy provided:  No billable psychotherapy service provided but brief supportive therapy was utilized.    Patient has been given this writer's contact information as well as the [facility] Psychiatry urgent line number. The patient has been instructed to call 911 for emergencies.    Subjective:  She reports that her insurance has been adjusted correctly. She has no major concerns. Looking forward to spending time with her family. Reviewed medications. No concerns at this time. Praised patient for her dedication to health and committment to getting insurance completed.         Objective:    Mental Status Exam:    Appearance:    Appears stated age, Well nourished and Clean/Neat   Motor:    tremulous, appropriate eye contact    Speech/Language:    Normal rate, volume, tone, fluency and Language intact, well formed   Mood:   Anxious   Affect:   Anxious, Cooperative and Full   Thought process and Associations:   Logical, linear, clear, coherent, goal directed   Abnormal/psychotic thought content:     Denies SI, HI, self harm, delusions, obsessions, paranoid ideation, or ideas of reference   Perceptual disturbances:     Denies auditory and visual hallucinations, behavior not concerning for response to internal stimuli     Other:   insight/judgement intact      LAMICTAL MONITORING: Hepatic function testing, platelets (via CBC) at no defined frequency, but should do at baseline and can do periodically  Lamictal Level: No results found for: LAMOTRIGINE       Patient was seen and plan of care was discussed with the Attending MD, Bottom who agrees with the above statement and plan.        Ovid Curd MD MPH  PGY3 Psychiatry          The patient reports they are currently: at home. I spent 20 minutes on the real-time audio and video with the patient on the date of service. I spent an additional 10 minutes on pre- and post-visit activities on the date of service.     The patient was physically located in [place] or a state in which I am permitted to provide care. The patient and/or parent/guardian understood that s/he may incur co-pays and cost sharing, and agreed to the telemedicine visit. The visit was reasonable and appropriate under the circumstances given the patient's presentation at the time.    The patient and/or parent/guardian has been advised of the potential risks and limitations of this mode of treatment (including, but not limited to, the absence of in-person examination) and has agreed to be treated using telemedicine. The patient's/patient's family's questions regarding telemedicine have been answered.  If the visit was completed in an ambulatory setting, the patient and/or parent/guardian has also been advised to contact their provider???s office for worsening conditions, and seek emergency medical treatment and/or call 911 if the patient deems either necessary.

## 2023-02-07 ENCOUNTER — Telehealth: Admit: 2023-02-07 | Discharge: 2023-02-08 | Payer: MEDICARE | Attending: Psychologist | Primary: Psychologist

## 2023-02-07 DIAGNOSIS — F4310 Post-traumatic stress disorder, unspecified: Principal | ICD-10-CM

## 2023-02-07 DIAGNOSIS — F331 Major depressive disorder, recurrent, moderate: Principal | ICD-10-CM

## 2023-02-07 DIAGNOSIS — F410 Panic disorder [episodic paroxysmal anxiety] without agoraphobia: Principal | ICD-10-CM

## 2023-02-07 DIAGNOSIS — F411 Generalized anxiety disorder: Principal | ICD-10-CM

## 2023-02-07 NOTE — Unmapped (Signed)
[facility]   Confidential Psychological Therapy Session      Patient Name: Caitlin Smith  Medical Record Number: [id number]  Date of Service: February 07, 2023  Attending Psychologist: Caroline More, PhD  CPT Procedure Codes: 81191 for 45 mins of face to face counseling    This visit was performed face to face with interactive technology using a HIPPA compliant audio/visual platform. We reviewed confidentiality today. The patient was present in [place], a state in which this provider is licensed and able to provide care (location and contact information confirmed), attended this visit alone, and consented to this virtual pain psychology visit.    REFERRING PHYSICIAN: Clarene Essex, MD    CHIEF COMPLAINT AND REASON FOR VISIT: pain coping skills, CBT to address depression and anxiety in the setting of chronic pain    SUBJECTIVE / HISTORY OF PRESENT ILLNESS: [name] is a very pleasant 67 y.o.  female from [place], [place] with multiple chronic pain complaints related to fibromyalgia and lupus who initially met with me in October 2016, at which time she was diagnosed with severe depression, PTSD, panic disorder, and generalized anxiety. The patient returns for a therapy session today. Last follow up with me was in 11/07/2022.    The patient participates actively today.  Pt shares how she used problem solving and communication strategies effectively. Reflected on the experience and how that can be used to address similar issues when she has conflict, problems. Pt having significant weight loss (45 lbs since Nov) has had blood work and CT, ad unremarkable. Has addressed with several of her MDs. Provided counseling. Walking an hour a day. Commended! Prepared for prednisone taper and possible side effects. Back pain is worse, muscle spasms - plans to talk to Dr. Fayrene Fearing for an injection.  Joints also hurting.  But noting she is walking for her lung health.     OBJECTIVE / MENTAL STATUS:    Appearance:   Appears stated age and Clean/Neat   Motor:  No abnormal movements   Speech/Language:   Normal rate, volume, tone, fluency   Mood:  Depressed and Anxious   Affect:  euthymic   Thought process:  Logical, linear, clear, coherent, goal directed   Thought content:    Denies SI, HI, self harm, delusions, obsessions, paranoid ideation, or ideas of reference   Perceptual disturbances:    Denies auditory and visual hallucinations, behavior not concerning for response to internal stimuli   Orientation:  Oriented to person, place, time, and general circumstances   Attention:  Able to fully attend without fluctuations in consciousness   Concentration:  Able to fully concentrate and attend   Memory:  Immediate, short-term, long-term, and recall grossly intact    Fund of knowledge:   Consistent with level of education and development   Insight:    Fair   Judgment:   Intact   Impulse Control:  Intact     DIAGNOSTIC IMPRESSION:   Post Traumatic Stress Disorder (PTSD)  Generalized Anxiety Disorder (GAD)  Panic Disorder  Major Depressive Disorder, moderate, recurrent  Chronic pain syndrome  Fibromyalgia  Lupus    ASSESSMENT:   [name] is a very pleasant 67 y.o.  female from [place], [place] with multiple chronic pain complaints related to lupus, arthritis, and fibromyalgia. She was previously seen in our clinic from 2012-2014, and reestablished care with Dr. Fayrene Fearing in August 2016. The patient has struggled with depression and anxiety, but is very motivated to  participate in intensive, multidisciplinary treatment to address the connection between depression, anxiety and pain. She has a long-standing history of depression and anxiety, and has worked with outpatient psychiatrist and therapist over the years. She is currently established with [facility] psychiatry.  In general, depression, anxiety, pain coping and panic have all improved (until recent bout with pulmonary issues), but the patient evidences a seasonal affective pattern of mood changes typically her mood tends to be worse during the winter.  Encouraged early morning sunlight and getting outside and walking as tolerated, building cardiovascular and muscular strength and endurance over time. Also encouraged practice of daily behavioral relaxation exercises as well.     PLAN:   (1) Psychotherapy - Continue CBT. CBT will be used to address PTSD, MDD, Panic D/o, GAD, and chronic pain.     --Behavioral Activation - Encouraged behavioral activation.  Is trying.  --Downloaded and uses Insight Timer, guided relaxation app, for nightly practice.  --Continues to utilize diaphragmatic breathing daily  --encouraged early morning sunlight / light therapy for possible SAD in the setting of chronic MDD    (2) Psychiatry - Pt currently established with [facility].  Is followed by Dr. Evette Cristal (attending) but typically has appointments with a psychiatrist resident, Dr. Virl Cagey.    (3) Safety - Pt denies any current or recent SI or safety concerns. Knows to call 911 or go to her local ED. Also previously given [facility].

## 2023-02-08 ENCOUNTER — Inpatient Hospital Stay: Payer: 59 | Attending: Oncology | Admitting: Oncology

## 2023-02-08 ENCOUNTER — Encounter: Payer: Self-pay | Admitting: Oncology

## 2023-02-08 VITALS — BP 151/81 | HR 69 | Temp 97.5°F | Resp 18 | Wt 207.2 lb

## 2023-02-08 DIAGNOSIS — R778 Other specified abnormalities of plasma proteins: Secondary | ICD-10-CM | POA: Insufficient documentation

## 2023-02-08 DIAGNOSIS — Z803 Family history of malignant neoplasm of breast: Secondary | ICD-10-CM | POA: Diagnosis not present

## 2023-02-08 DIAGNOSIS — N189 Chronic kidney disease, unspecified: Secondary | ICD-10-CM | POA: Diagnosis not present

## 2023-02-08 DIAGNOSIS — Z800 Family history of malignant neoplasm of digestive organs: Secondary | ICD-10-CM | POA: Diagnosis not present

## 2023-02-08 DIAGNOSIS — I129 Hypertensive chronic kidney disease with stage 1 through stage 4 chronic kidney disease, or unspecified chronic kidney disease: Secondary | ICD-10-CM | POA: Insufficient documentation

## 2023-02-08 NOTE — Assessment & Plan Note (Signed)
Labs are reviewed and discussed with patient. Lab Results  Component Value Date   MPROTEIN Not Observed 01/13/2023   KAPLAMBRATIO 1.80 (H) 01/13/2023    Protein electrophoresis did not show any M protein.  Patient has elevated IgG, immunofixation showed polyclonal increase of immunoglobulins.  Consistent with chronic inflammation.  Very mild increase of light chain increase, nonspecific. Recommend 24-hour urine protein electrophoresis.  If negative, I will hold off additional workup.

## 2023-02-08 NOTE — Assessment & Plan Note (Signed)
Refer to genetic counselor Depending on genetic testing results and Tyrer-Cuzick Risk, if above 20%, patient may be eligible for more rigorous breast cancer surveillance with annual MRI alternating with mammogram.

## 2023-02-08 NOTE — Progress Notes (Signed)
Hematology/Oncology Consult note [phone] [phone]        REFERRING PROVIDER: Alba Cory, MD   CHIEF COMPLAINTS/REASON FOR VISIT:  abnormal SPEP.  Family history of breast cancer   ASSESSMENT & PLAN:   Abnormal SPEP Labs are reviewed and discussed with patient. Lab Results  Component Value Date   MPROTEIN Not Observed 01/13/2023   KAPLAMBRATIO 1.80 (H) 01/13/2023    Protein electrophoresis did not show any M protein.  Patient has elevated IgG, immunofixation showed polyclonal increase of immunoglobulins.  Consistent with chronic inflammation.  Very mild increase of light chain increase, nonspecific. Recommend 24-hour urine protein electrophoresis.  If negative, I will hold off additional workup.  Family history of breast cancer Refer to genetic counselor Depending on genetic testing results and Tyrer-Cuzick Risk, if above 20%, patient may be eligible for more rigorous breast cancer surveillance with annual MRI alternating with mammogram.   Orders Placed This Encounter  Procedures   IFE+PROTEIN ELECTRO, 24-HR UR    Standing Status:   Future    Standing Expiration Date:   02/08/2024   Follow-up to be determined.  Depending on urine testing as well as genetic testing. All questions were answered. The patient knows to call the clinic with any problems, questions or concerns.  Rickard Patience, MD, PhD [facility] Hematology Oncology 02/08/2023   HISTORY OF PRESENTING ILLNESS:   Nicole Ballard is a  67 y.o.  female with PMH listed below was seen in consultation at the request of  Alba Cory, MD  for evaluation of unintentional weight loss, abnormal SPEP.  Patient reports a 84-month history of weight loss of 40 pounds.  Has not changed her diet or activity level.  Denies any night sweats, fever.  TSH has been checked and was normal. She takes metformin for about a year.  She walks 1 hour/day for about a year.  Chronic constipation.  She has  family history significant for breast cancer as well as stomach cancer.  She follows up with [facility] high risk clinic and gets mammogram done.  Never had genetic testing.  08/25/2022, CT abdomen pelvis without contrast showed trace free fluid in the pelvis of uncertain etiology.  Large amount of stool throughout the colon.  Colonic diverticulosis without evidence of dactylitis. Report colonoscopy in 2023.  08/26/2022, at [facility], she had some blood work done. SPEP showed slight increase of gammaglobulin level at 1.7.  SPE pattern demonstrate the irregularity and increased in the gamma region.  Which may represent a monoclonal protein.  Light chain ratio is slightly elevated 2.  Kappa light chain 3.2.  INTERVAL HISTORY Nicole Ballard is a 67 y.o. female who has above history reviewed by me today presents for follow up visit for abnormal SPEP.  Family history of breast cancer Patient presents to discuss results.  She has upcoming genetic counseling appointment on 03/01/2023.  MEDICAL HISTORY:  Past Medical History:  Diagnosis Date   Anxiety    Bipolar 1 disorder (HCC)    Chronic kidney disease    Chronic pain    Diabetes mellitus without complication (HCC)    Fibromyalgia    GERD (gastroesophageal reflux disease)    Hearing loss of right ear    Hemifacial spasm    Hyperlipidemia    Hypertension    Insomnia    Lumbago    Lung disease    Osteoarthritis    Paresthesia    Rhinitis    Systemic lupus erythematosus (HCC)    Vitamin  D deficiency     SURGICAL HISTORY: Past Surgical History:  Procedure Laterality Date   BRAIN SURGERY     CHOLECYSTECTOMY     VAGINAL HYSTERECTOMY     vulvar surgery      SOCIAL HISTORY: Social History   Socioeconomic History   Marital status: Divorced    Spouse name: Not on file   Number of children: 2   Years of education: Not on file   Highest education level: GED or equivalent  Occupational History   Occupation: Disability  Tobacco Use    Smoking status: Never   Smokeless tobacco: Never  Vaping Use   Vaping Use: Never used  Substance and Sexual Activity   Alcohol use: No    Alcohol/week: 0.0 standard drinks of alcohol   Drug use: No   Sexual activity: Not Currently    Partners: Male  Other Topics Concern   Not on file  Social History Narrative   Pt lives alone   Social Determinants of Health   Financial Resource Strain: Low Risk  (08/19/2021)   Overall Financial Resource Strain (CARDIA)    Difficulty of Paying Living Expenses: Not hard at all  Food Insecurity: No Food Insecurity (01/13/2023)   Hunger Vital Sign    Worried About Running Out of Food in the Last Year: Never true    Ran Out of Food in the Last Year: Never true  Transportation Needs: No Transportation Needs (08/19/2021)   PRAPARE - Administrator, Civil Service (Medical): No    Lack of Transportation (Non-Medical): No  Physical Activity: Sufficiently Active (08/19/2021)   Exercise Vital Sign    Days of Exercise per Week: 5 days    Minutes of Exercise per Session: 30 min  Stress: Stress Concern Present (08/19/2021)   [employer] of Occupational Health - Occupational Stress Questionnaire    Feeling of Stress : To some extent  Social Connections: Moderately Integrated (08/19/2021)   Social Connection and Isolation Panel [NHANES]    Frequency of Communication with Friends and Family: More than three times a week    Frequency of Social Gatherings with Friends and Family: Once a week    Attends Religious Services: More than 4 times per year    Active Member of [employer] or Organizations: Yes    Attends Engineer, structural: More than 4 times per year    Marital Status: Divorced  Intimate Partner Violence: Not At Risk (01/13/2023)   Humiliation, Afraid, Rape, and Kick questionnaire    Fear of Current or Ex-Partner: No    Emotionally Abused: No    Physically Abused: No    Sexually Abused: No    FAMILY HISTORY: Family History   Problem Relation Age of Onset   Diabetes Mother    Hypertension Mother    Breast cancer Mother    Stomach cancer Father    Breast cancer Maternal Aunt    Breast cancer Maternal Aunt    Breast cancer Maternal Aunt     ALLERGIES:  is allergic to ace inhibitors, bee pollen, penicillins, and pollen extract.  MEDICATIONS:  Current Outpatient Medications  Medication Sig Dispense Refill   albuterol (VENTOLIN HFA) 108 (90 Base) MCG/ACT inhaler SMARTSIG:2 Puff(s) By Mouth Every 6 Hours PRN     amLODipine (NORVASC) 2.5 MG tablet Take 3 tablets (7.5 mg total) by mouth daily. 270 tablet 3   aspirin EC 81 MG tablet Take 1 tablet (81 mg total) by mouth daily. 30 tablet 0  atenolol (TENORMIN) 25 MG tablet Take 1 tablet by mouth in the morning and at bedtime.     atorvastatin (LIPITOR) 40 MG tablet Take 1 tablet (40 mg total) by mouth daily. 90 tablet 3   Biotin 1 MG CAPS Take 1 capsule by mouth daily. 30 capsule 0   buPROPion (WELLBUTRIN XL) 300 MG 24 hr tablet Take 300 mg by mouth in the morning.     Cholecalciferol (VITAMIN D) 2000 UNITS CAPS Take 1 capsule by mouth daily.     clobetasol ointment (TEMOVATE) 0.05 % Apply 1 application topically 2 (two) times daily.     clonazePAM (KLONOPIN) 0.5 MG tablet Take 0.25 mg by mouth at bedtime.     clotrimazole-betamethasone (LOTRISONE) cream Apply 1 application topically 2 (two) times daily. 45 g 0   cyclobenzaprine (FLEXERIL) 5 MG tablet Take by mouth.     diclofenac Sodium (VOLTAREN) 1 % GEL Apply topically daily as needed.     famotidine (PEPCID) 20 MG tablet Take 20 mg by mouth daily.     fluticasone (FLONASE) 50 MCG/ACT nasal spray Place 2 sprays into both nostrils daily. 48 g 3   hydroxychloroquine (PLAQUENIL) 200 MG tablet Take by mouth 2 (two) times daily.     hydrOXYzine (ATARAX) 10 MG tablet Take 1 tablet (10 mg total) by mouth 3 (three) times daily as needed. 30 tablet 0   lamoTRIgine (LAMICTAL) 200 MG tablet Take 400 mg by mouth at  bedtime.     LINZESS 145 MCG CAPS capsule Take 145 mcg by mouth daily.     loratadine (CLARITIN) 10 MG tablet Take 1 tablet (10 mg total) by mouth daily. 90 tablet 1   Melatonin 10 MG TABS Take 1 tablet by mouth at bedtime.     metFORMIN (GLUCOPHAGE-XR) 500 MG 24 hr tablet TAKE 1 TABLET BY MOUTH DAILY  WITH BREAKFAST 100 tablet 2   metoprolol succinate (TOPROL-XL) 25 MG 24 hr tablet Take 1 tablet (25 mg total) by mouth daily. In place of Atenolol 30 tablet 0   mycophenolate (MYFORTIC) 360 MG TBEC EC tablet Take 720 mg by mouth 2 (two) times daily.     olopatadine (PATANOL) 0.1 % ophthalmic solution Place 1 drop into both eyes 2 (two) times daily. 5 mL 1   omeprazole (PRILOSEC) 40 MG capsule Take 1 capsule by mouth in the morning and at bedtime. Pulmonologist     polyethylene glycol powder (GLYCOLAX/MIRALAX) 17 GM/SCOOP powder Take 17 g by mouth 2 (two) times daily as needed. 3350 g 1   predniSONE (DELTASONE) 2.5 MG tablet Take 2 tablets by mouth daily at 12 noon.     pregabalin (LYRICA) 50 MG capsule Take 50 mg by mouth 3 (three) times daily.     telmisartan (MICARDIS) 80 MG tablet Take 1 tablet (80 mg total) by mouth daily. 90 tablet 3   traZODone (DESYREL) 100 MG tablet Take 150 mg by mouth at bedtime.     triamcinolone ointment (KENALOG) 0.1 %      No current facility-administered medications for this visit.    Review of Systems  Constitutional:  Positive for unexpected weight change. Negative for appetite change, chills, fatigue and fever.  HENT:   Negative for hearing loss and voice change.   Eyes:  Negative for eye problems.  Respiratory:  Negative for chest tightness and cough.   Cardiovascular:  Negative for chest pain.  Gastrointestinal:  Negative for abdominal distention, abdominal pain and blood in stool.  Endocrine: Negative for  hot flashes.  Genitourinary:  Negative for difficulty urinating and frequency.   Musculoskeletal:  Negative for arthralgias.  Skin:  Negative for  itching and rash.  Neurological:  Negative for extremity weakness.  Hematological:  Negative for adenopathy.  Psychiatric/Behavioral:  Negative for confusion.    PHYSICAL EXAMINATION: ECOG PERFORMANCE STATUS: 0 - Asymptomatic Vitals:   02/08/23 0947  BP: (!) 151/81  Pulse: 69  Resp: 18  Temp: (!) 97.5 F (36.4 C)  SpO2: 100%   Filed Weights   02/08/23 0947  Weight: 207 lb 3.2 oz (94 kg)    Physical Exam Constitutional:      General: She is not in acute distress. HENT:     Head: Normocephalic and atraumatic.  Eyes:     General: No scleral icterus. Cardiovascular:     Rate and Rhythm: Normal rate and regular rhythm.     Heart sounds: Normal heart sounds.  Pulmonary:     Effort: Pulmonary effort is normal. No respiratory distress.     Breath sounds: No wheezing.  Abdominal:     General: Bowel sounds are normal. There is no distension.     Palpations: Abdomen is soft.  Musculoskeletal:        General: No deformity. Normal range of motion.     Cervical back: Normal range of motion and neck supple.  Skin:    General: Skin is warm and dry.     Findings: No erythema or rash.  Neurological:     Mental Status: She is alert and oriented to person, place, and time. Mental status is at baseline.     Cranial Nerves: No cranial nerve deficit.     Coordination: Coordination normal.  Psychiatric:        Mood and Affect: Mood normal.     LABORATORY DATA:  I have reviewed the data as listed    Latest Ref Rng & Units 01/13/2023   11:18 AM 10/24/2022    3:11 PM 04/13/2022   10:28 PM  CBC  WBC 4.0 - 10.5 K/uL 3.9  5.2  10.4   Hemoglobin 12.0 - 15.0 g/dL 16.1  09.6  04.5   Hematocrit 36.0 - 46.0 % 42.3  42.9  44.0   Platelets 150 - 400 K/uL 294  324  363       Latest Ref Rng & Units 01/13/2023   11:18 AM 10/24/2022    3:11 PM 04/13/2022   10:28 PM  CMP  Glucose 70 - 99 mg/dL 78  61  409   BUN 8 - 23 mg/dL 16  12  13    Creatinine 0.44 - 1.00 mg/dL 8.11  9.14  7.82   Sodium  135 - 145 mmol/L 136  143  141   Potassium 3.5 - 5.1 mmol/L 4.6  4.6  3.8   Chloride 98 - 111 mmol/L 98  104  104   CO2 22 - 32 mmol/L 27  30  29    Calcium 8.9 - 10.3 mg/dL 9.1  9.8  9.2   Total Protein 6.5 - 8.1 g/dL 7.9  7.5    Total Bilirubin 0.3 - 1.2 mg/dL 0.3  0.4    Alkaline Phos 38 - 126 U/L 59     AST 15 - 41 U/L 22  16    ALT 0 - 44 U/L 17  14        RADIOGRAPHIC STUDIES: I have personally reviewed the radiological images as listed and agreed with the findings in the report. No  results found.

## 2023-02-14 ENCOUNTER — Ambulatory Visit: Admit: 2023-02-14 | Discharge: 2023-02-15 | Payer: MEDICARE

## 2023-02-14 ENCOUNTER — Ambulatory Visit
Admit: 2023-02-14 | Discharge: 2023-02-15 | Payer: MEDICARE | Attending: Student in an Organized Health Care Education/Training Program | Primary: Student in an Organized Health Care Education/Training Program

## 2023-02-14 DIAGNOSIS — J849 Interstitial pulmonary disease, unspecified: Secondary | ICD-10-CM | POA: Diagnosis not present

## 2023-02-14 DIAGNOSIS — G894 Chronic pain syndrome: Secondary | ICD-10-CM | POA: Diagnosis not present

## 2023-02-14 DIAGNOSIS — M329 Systemic lupus erythematosus, unspecified: Secondary | ICD-10-CM | POA: Diagnosis not present

## 2023-02-14 DIAGNOSIS — J8489 Other specified interstitial pulmonary diseases: Secondary | ICD-10-CM | POA: Diagnosis not present

## 2023-02-14 DIAGNOSIS — E1142 Type 2 diabetes mellitus with diabetic polyneuropathy: Secondary | ICD-10-CM | POA: Diagnosis not present

## 2023-02-14 DIAGNOSIS — M159 Polyosteoarthritis, unspecified: Secondary | ICD-10-CM | POA: Diagnosis not present

## 2023-02-14 DIAGNOSIS — R2689 Other abnormalities of gait and mobility: Secondary | ICD-10-CM | POA: Diagnosis not present

## 2023-02-14 DIAGNOSIS — R519 Headache, unspecified: Secondary | ICD-10-CM | POA: Diagnosis not present

## 2023-02-14 DIAGNOSIS — M48061 Spinal stenosis, lumbar region without neurogenic claudication: Secondary | ICD-10-CM | POA: Diagnosis not present

## 2023-02-14 DIAGNOSIS — M797 Fibromyalgia: Secondary | ICD-10-CM | POA: Diagnosis not present

## 2023-02-14 DIAGNOSIS — M542 Cervicalgia: Secondary | ICD-10-CM | POA: Diagnosis not present

## 2023-02-14 MED ORDER — CYCLOBENZAPRINE 5 MG TABLET
ORAL_TABLET | Freq: Every evening | ORAL | 2 refills | 60 days | Status: CP | PRN
Start: 2023-02-14 — End: ?
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
## 2023-02-14 NOTE — Unmapped (Signed)
You were seen in the outpatient neurology clinic today for headaches and neuropathy.    I am glad your headaches are better. I have sent in a refill for your muscle relaxer.    I have ordered an MRI of your brain given your new balance problems. Your balance problems are likely a combination of your neuropathy (from diabetes most likely) or your prior surgery, but I want to rule out a stroke as a cause for the worsened symptoms.    Please continue to follow up with your other doctors regarding your weight loss.

## 2023-02-15 NOTE — Unmapped (Signed)
[facility] Refill Coordination Note    Caitlin Smith, DOB: 27-Apr-1956  Phone: [phone] (home)       All above HIPAA information was verified with patient.         02/14/2023     4:49 PM   Specialty Rx Medication Refill Questionnaire   Which Medications would you like refilled and shipped? Mycophenolic 300mg  maybe 2 weeks   If medication refills are not needed at this time, please indicate the reason below. n/a   Please list all current allergies: penicillin   Have you missed any doses in the last 30 days? No   Have you had any changes to your medication(s) since your last refill? No   How many days remaining of each medication do you have at home? maybe 2 weeks   Have you experienced any side effects in the last 30 days? No   Please enter the full address (street address, city, state, zip code) where you would like your medication(s) to be delivered to. [address], [address] [place] [address]   Please specify on which day you would like your medication(s) to arrive. Note: if you need your medication(s) within 3 days, please call the pharmacy to schedule your order at [phone]  03/02/2023   Has your insurance changed since your last refill? No   Would you like a pharmacist to call you to discuss your medication(s)? No   Do you require a signature for your package? (Note: if we are billing Medicare Part B or your order contains a controlled substance, we will require a signature) No   Additional Comments: Thank you         Completed refill call assessment today to schedule patient's medication shipment from the [facility] [phone]).  All relevant notes have been reviewed.       Confirmed patient received a Conservation officer, historic buildings and a Surveyor, mining with first shipment. The patient will receive a drug information handout for each medication shipped and additional FDA Medication Guides as required.         REFERRAL TO PHARMACIST     Referral to the pharmacist: Not needed      [facility]     Shipping address confirmed in Epic.     Delivery Scheduled: Yes, Expected medication delivery date: 03/02/2023.     Medication will be delivered via Next Day Courier to the prescription address in Epic WAM.    Dorisann Frames   [facility] Specialty Technician

## 2023-02-15 NOTE — Unmapped (Unsigned)
I responded to this pt in the portal.

## 2023-02-16 ENCOUNTER — Other Ambulatory Visit: Payer: Self-pay

## 2023-02-16 DIAGNOSIS — Z803 Family history of malignant neoplasm of breast: Secondary | ICD-10-CM | POA: Diagnosis not present

## 2023-02-16 DIAGNOSIS — Z800 Family history of malignant neoplasm of digestive organs: Secondary | ICD-10-CM | POA: Diagnosis not present

## 2023-02-16 DIAGNOSIS — I129 Hypertensive chronic kidney disease with stage 1 through stage 4 chronic kidney disease, or unspecified chronic kidney disease: Secondary | ICD-10-CM | POA: Diagnosis not present

## 2023-02-16 DIAGNOSIS — R778 Other specified abnormalities of plasma proteins: Secondary | ICD-10-CM

## 2023-02-16 DIAGNOSIS — N189 Chronic kidney disease, unspecified: Secondary | ICD-10-CM | POA: Diagnosis not present

## 2023-02-17 LAB — IFE+PROTEIN ELECTRO, 24-HR UR
% BETA, Urine: 0 %
ALPHA 1 URINE: 0 %
Albumin, U: 100 %
Alpha 2, Urine: 0 %
GAMMA GLOBULIN URINE: 0 %
Total Protein, Urine-Ur/day: 56 mg/24 hr (ref 30–150)
Total Protein, Urine: 7 mg/dL
Total Volume: 800

## 2023-02-23 ENCOUNTER — Ambulatory Visit: Admit: 2023-02-23 | Discharge: 2023-02-24 | Payer: MEDICARE | Attending: Nephrology | Primary: Nephrology

## 2023-02-23 DIAGNOSIS — I10 Essential (primary) hypertension: Secondary | ICD-10-CM | POA: Diagnosis not present

## 2023-02-23 DIAGNOSIS — M329 Systemic lupus erythematosus, unspecified: Secondary | ICD-10-CM | POA: Diagnosis not present

## 2023-02-23 LAB — PROTEIN / CREATININE RATIO, URINE
CREATININE, URINE: 251.4 mg/dL
PROTEIN URINE: 38 mg/dL
PROTEIN/CREAT RATIO, URINE: 0.151

## 2023-02-23 LAB — ALBUMIN / CREATININE URINE RATIO
ALBUMIN QUANT URINE: 0.4 mg/dL
ALBUMIN/CREATININE RATIO: 1.6 ug/mg (ref 0.0–30.0)
CREATININE, URINE: 251.4 mg/dL
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
## 2023-02-27 MED ORDER — PREGABALIN 50 MG CAPSULE
ORAL_CAPSULE | Freq: Three times a day (TID) | ORAL | 1 refills | 0 days
Start: 2023-02-27 — End: ?

## 2023-02-28 ENCOUNTER — Ambulatory Visit: Admit: 2023-02-28 | Payer: MEDICARE

## 2023-02-28 ENCOUNTER — Ambulatory Visit: Admit: 2023-02-28 | Discharge: 2023-03-29 | Payer: MEDICARE

## 2023-02-28 ENCOUNTER — Ambulatory Visit
Admit: 2023-02-28 | Payer: MEDICARE | Attending: Student in an Organized Health Care Education/Training Program | Primary: Student in an Organized Health Care Education/Training Program

## 2023-02-28 DIAGNOSIS — K5904 Chronic idiopathic constipation: Secondary | ICD-10-CM | POA: Diagnosis not present

## 2023-02-28 DIAGNOSIS — R52 Pain, unspecified: Secondary | ICD-10-CM | POA: Diagnosis not present

## 2023-02-28 DIAGNOSIS — M6289 Other specified disorders of muscle: Secondary | ICD-10-CM | POA: Diagnosis not present

## 2023-02-28 MED ORDER — PREGABALIN 50 MG CAPSULE
ORAL_CAPSULE | Freq: Three times a day (TID) | ORAL | 1 refills | 30 days | Status: CP
Start: 2023-02-28 — End: 2024-02-28

## 2023-02-28 NOTE — Progress Notes (Unsigned)
Name: Nicole Ballard   MRN: [id number]    DOB: Oct 10, 1955   Date:03/01/2023       Progress Note  Subjective  Chief Complaint  Follow Up  HPI  Unintentional weight loss/malnutrition  she states her diet has not changed or her level of physical activity - she has been walking 1 hour per day for over one year now - she walks as recommended by her pulmonologist to improve lung function.  She is up to date with health maintenance colonoscopy and mammogram , she had a hysterectomy in the past recently seen by gyn and had a normal pelvic exam   She states appetite is fine, no change in bowel movements. She has lost weight in the past due to stress but her depression seems to be under better control now and she states not really stressed about anything at this time. She has lost over 5 % of her weigh tin the past 3 months and over 10 % in the past year She has lupus and other auto immune disorders . She is now seeing Dr. Cathie Hoops for gammopathy - but no M spike, due to family history of gastric and breast cancer she was referred to a geneticist and is waiting for an appointment    09/2021 - 239 lbs 12/2021 -  241 lbs 04/2022 - 246 lbs  06/2022 - 245 lbs 07/2022 - 233 lbs  10/2022 - 219 lbs  01/2023 -205.9 lb 02/2023 - 204 lbs   Vertigo: she still has intermittent symptoms, no longer taking meclizine due to age and possible side effects. She has Zofran but has not been nauseated enough to take the medication. She has noticed that she is walking towards the left side. She is under the care of Dr. Berdie Ogren at [facility], she also sees neurologist   HTN:   Continue Atenolol 25 BID ,  Norvasc 7.5 mg and Telmisartan 80 mg , she states bp has been well controlled at home lately, 120's/70's She has chronic sob and multifactorial, she has intermittent palpitation but stable.    INTERPRETATION ( done 02/2019 )  Normal Stress Echocardiogram NORMAL RIGHT VENTRICULAR SYSTOLIC FUNCTION TRIVIAL REGURGITATION NOTED  NO  VALVULAR STENOSIS NOTED   Aorta Atherosclerosis: she is on statin therapy. Reviewed CT report from 08/2022 with patient .   History of DM: denies polyphagia, polyuria or polydipsia A1C  dropped from 6.3 % to 5.9 % continue life style modification. .  She is on metformin due to gradual weight gain and now is losing weight    Chronic Recurrent MDD /Mild Neurocognitive Impairment: under the care of psychiatrist  at [facility], she is taking Effexor 100  mg, Lamictal, Wellbutrin 300 mg now, and Klonopin qhs. She is doing better with her psychiatrist . She also has a therapist. She is concerned about her weight loss. Reminded her that it has happened before and she has seen all the doctors and given reassurance. Advised to keep a good intake of protein and try to maintain her weight now   Dyslipidemia: reviewed last labs, taking Atorvastatin and denies side effects. LDL 56 done Nov 2023 , continue current regiment     Post-inflammatory pulmonary fibrosis/Asthma? Lupus involvement of lung : under the care of pulmonologist at [facility], she has occasional cough she continues to have SOB - likely multifactorial. She is on PPI and also uses Flovent Unchanged    Lupus involvement lungs/Immunosuppression  she is seeing Rheumatologist , taking plaquenil and prednisone daily but now  down to 5 mg daily,  she still has some SOB but improved since she lost weight and has been walking daily . She states symptoms have been stable.    Chronic nausea: taking zofran prn and controls symptoms, she has a gastroenterologist Stable    Empty Sella: last MRI was 2021  no longer showed pituitary adenoma. Seen by neurosurgeon in the past but now released She states headache is intermittent and not as severe. Unchanged    Chronic constipation: she is taking Linzess daily again and still can skip having bowel movements a couple a days a week. No blood in stools    Chronic pain from/Neurogenic claudication Lupus and FMS: going to  pain clinic, she states her back pain is intense and sometimes, she also has knee pain, muscle aches. She is taking Lyrica three  daily and seems to help with pain    Eczema: she has medication given by Dermatologist, and is stable.  Patient Active Problem List   Diagnosis Date Noted   Abnormal SPEP 01/13/2023   Weight loss 01/13/2023   Family history of breast cancer 01/13/2023   Asthma, chronic, severe persistent, uncomplicated 10/12/2022   Other vascular myelopathies (HCC) 10/12/2022   Interstitial lung disease (HCC) 07/12/2022   Empty sella (HCC) 07/12/2022   History of craniotomy 07/12/2022   Immunocompromised (HCC) 04/29/2021   Hyperglycemia 10/23/2017   Vertigo 09/29/2016   Bronchiolitis obliterans organizing pneumonia (HCC) 09/29/2016   Neck pain, musculoskeletal 12/15/2015   Cough in adult 09/11/2015   Abnormal CAT scan 04/11/2015   Auditory impairment 04/11/2015   Insomnia, persistent 04/11/2015   Chronic kidney disease (CKD), stage I 04/11/2015   Chronic nonmalignant pain 04/11/2015   Chronic constipation 04/11/2015   Dyslipidemia 04/11/2015   Fibromyalgia 04/11/2015   Gastro-esophageal reflux disease without esophagitis 04/11/2015   Dysphonia 04/11/2015   Low back pain 04/11/2015   Eczema intertrigo 04/11/2015   Keratosis pilaris 04/11/2015   Chronic recurrent major depressive disorder (HCC) 04/11/2015   Dysmetabolic syndrome 04/11/2015   Neurogenic claudication 04/11/2015   Perennial allergic rhinitis with seasonal variation 04/11/2015   Abnormal presence of protein in urine 04/11/2015   Seborrhea capitis 04/11/2015   Moderate dysplasia of vulva 04/11/2015   Vitamin D deficiency 04/11/2015   Spinal stenosis of lumbar region 04/11/2015   Treadmill stress test negative for angina pectoris 03/05/2015   Shortness of breath 05/20/2014   Morbid obesity (HCC) 02/03/2014   Asthma, chronic 12/31/2013   H/O total knee replacement 12/31/2013   Episcleritis 09/26/2013    Vocal cord dysfunction 05/28/2013   Anxiety, generalized 03/19/2013   Back pain 12/18/2012   Pulmonary nodules 11/26/2012   Lung nodule, multiple 11/26/2012   Arthritis, degenerative 11/01/2012   H/O aspiration pneumonitis 10/22/2012   Postinflammatory pulmonary fibrosis (HCC) 10/22/2012   Mixed incontinence 05/04/2012   Lung involvement in systemic lupus erythematosus (HCC) 11/05/2010   Chronic nausea 07/01/2008   Benign hypertension 01/25/2005    Past Surgical History:  Procedure Laterality Date   BRAIN SURGERY     CHOLECYSTECTOMY     VAGINAL HYSTERECTOMY     vulvar surgery      Family History  Problem Relation Age of Onset   Diabetes Mother    Hypertension Mother    Breast cancer Mother    Stomach cancer Father    Breast cancer Maternal Aunt    Breast cancer Maternal Aunt    Breast cancer Maternal Aunt     Social History   Tobacco Use  Smoking status: Never   Smokeless tobacco: Never  Substance Use Topics   Alcohol use: No    Alcohol/week: 0.0 standard drinks of alcohol     Current Outpatient Medications:    albuterol (VENTOLIN HFA) 108 (90 Base) MCG/ACT inhaler, SMARTSIG:2 Puff(s) By Mouth Every 6 Hours PRN, Disp: , Rfl:    amLODipine (NORVASC) 2.5 MG tablet, Take 3 tablets (7.5 mg total) by mouth daily., Disp: 270 tablet, Rfl: 3   aspirin EC 81 MG tablet, Take 1 tablet (81 mg total) by mouth daily., Disp: 30 tablet, Rfl: 0   atenolol (TENORMIN) 25 MG tablet, Take 1 tablet by mouth in the morning and at bedtime., Disp: , Rfl:    atorvastatin (LIPITOR) 40 MG tablet, Take 1 tablet (40 mg total) by mouth daily., Disp: 90 tablet, Rfl: 3   Biotin 1 MG CAPS, Take 1 capsule by mouth daily., Disp: 30 capsule, Rfl: 0   buPROPion (WELLBUTRIN XL) 300 MG 24 hr tablet, Take 300 mg by mouth in the morning., Disp: , Rfl:    Cholecalciferol (VITAMIN D) 2000 UNITS CAPS, Take 1 capsule by mouth daily., Disp: , Rfl:    clobetasol ointment (TEMOVATE) 0.05 %, Apply 1  application topically 2 (two) times daily., Disp: , Rfl:    clonazePAM (KLONOPIN) 0.5 MG tablet, Take 0.25 mg by mouth at bedtime., Disp: , Rfl:    clotrimazole-betamethasone (LOTRISONE) cream, Apply 1 application topically 2 (two) times daily., Disp: 45 g, Rfl: 0   cyclobenzaprine (FLEXERIL) 5 MG tablet, Take by mouth., Disp: , Rfl:    diclofenac Sodium (VOLTAREN) 1 % GEL, Apply topically daily as needed., Disp: , Rfl:    fluticasone (FLONASE) 50 MCG/ACT nasal spray, Place 2 sprays into both nostrils daily., Disp: 48 g, Rfl: 3   hydroxychloroquine (PLAQUENIL) 200 MG tablet, Take by mouth 2 (two) times daily., Disp: , Rfl:    hydrOXYzine (ATARAX) 10 MG tablet, Take 1 tablet (10 mg total) by mouth 3 (three) times daily as needed., Disp: 30 tablet, Rfl: 0   lamoTRIgine (LAMICTAL) 200 MG tablet, Take 400 mg by mouth at bedtime., Disp: , Rfl:    LINZESS 145 MCG CAPS capsule, Take 145 mcg by mouth daily., Disp: , Rfl:    loratadine (CLARITIN) 10 MG tablet, Take 1 tablet (10 mg total) by mouth daily., Disp: 90 tablet, Rfl: 1   Melatonin 10 MG TABS, Take 1 tablet by mouth at bedtime., Disp: , Rfl:    metFORMIN (GLUCOPHAGE-XR) 500 MG 24 hr tablet, TAKE 1 TABLET BY MOUTH DAILY  WITH BREAKFAST, Disp: 100 tablet, Rfl: 2   metoprolol succinate (TOPROL-XL) 25 MG 24 hr tablet, Take 1 tablet (25 mg total) by mouth daily. In place of Atenolol, Disp: 30 tablet, Rfl: 0   mycophenolate (MYFORTIC) 360 MG TBEC EC tablet, Take 720 mg by mouth 2 (two) times daily., Disp: , Rfl:    olopatadine (PATANOL) 0.1 % ophthalmic solution, Place 1 drop into both eyes 2 (two) times daily., Disp: 5 mL, Rfl: 1   omeprazole (PRILOSEC) 40 MG capsule, Take 1 capsule by mouth in the morning and at bedtime. Pulmonologist, Disp: , Rfl:    polyethylene glycol powder (GLYCOLAX/MIRALAX) 17 GM/SCOOP powder, Take 17 g by mouth 2 (two) times daily as needed., Disp: 3350 g, Rfl: 1   predniSONE (DELTASONE) 2.5 MG tablet, Take 2 tablets by mouth  daily at 12 noon., Disp: , Rfl:    pregabalin (LYRICA) 50 MG capsule, Take 50 mg by mouth 3 (  three) times daily., Disp: , Rfl:    telmisartan (MICARDIS) 80 MG tablet, Take 1 tablet (80 mg total) by mouth daily., Disp: 90 tablet, Rfl: 3   traZODone (DESYREL) 100 MG tablet, Take 150 mg by mouth at bedtime., Disp: , Rfl:    triamcinolone ointment (KENALOG) 0.1 %, , Disp: , Rfl:    famotidine (PEPCID) 20 MG tablet, Take 20 mg by mouth daily., Disp: , Rfl:   Allergies  Allergen Reactions   Ace Inhibitors     Other reaction(s): OTHER Pt states she can not take ace inhibitors. Pt states she can not remember her reaction.    Bee Pollen    Penicillins    Pollen Extract     I personally reviewed active problem list, medication list, allergies, family history, social history, health maintenance with the patient/caregiver today.   ROS  Ten systems reviewed and is negative except as mentioned in HPI   Objective  Vitals:   03/01/23 0752  BP: 118/70  Pulse: 81  Resp: 16  SpO2: 99%  Weight: 204 lb (92.5 kg)  Height: 5\' 8"  (1.727 m)    Body mass index is 31.02 kg/m.  Physical Exam  Constitutional: Patient appears well-developed and well-nourished. Obese  No distress.  HEENT: head atraumatic, normocephalic, pupils equal and reactive to light, neck supple Cardiovascular: Normal rate, regular rhythm and normal heart sounds.  No murmur heard. No BLE edema. Pulmonary/Chest: Effort normal and breath sounds normal. No respiratory distress. Abdominal: Soft.  There is no tenderness. Psychiatric: Patient has a normal mood and affect. behavior is normal. Judgment and thought content normal.    PHQ2/9:    03/01/2023    7:47 AM 01/13/2023   11:15 AM 01/10/2023    8:45 AM 10/24/2022    1:53 PM 10/12/2022    9:29 AM  Depression screen PHQ 2/9  Decreased Interest 1 1 1 2 2   Down, Depressed, Hopeless 1 1 0 2 2  PHQ - 2 Score 2 2 1 4 4   Altered sleeping 1  0 0 0  Tired, decreased energy 1  2 3 3    Change in appetite 1  1 3 3   Feeling bad or failure about yourself  0  1 2 2   Trouble concentrating 1  2 1 1   Moving slowly or fidgety/restless 0  1 0 0  Suicidal thoughts 0  0 0 0  PHQ-9 Score 6  8 13 13   Difficult doing work/chores   Somewhat difficult Somewhat difficult Somewhat difficult    phq 9 is positive   Fall Risk:    03/01/2023    7:47 AM 01/10/2023    8:35 AM 10/24/2022    1:53 PM 10/12/2022    9:29 AM 08/23/2022   10:09 AM  Fall Risk   Falls in the past year? 0 0 0 0 0  Number falls in past yr: 0    0  Injury with Fall? 0    0  Risk for fall due to : No Fall Risks No Fall Risks No Fall Risks No Fall Risks No Fall Risks  Follow up Falls prevention discussed Falls prevention discussed;Education provided;Falls evaluation completed Falls prevention discussed Falls prevention discussed;Education provided;Falls evaluation completed Falls prevention discussed;Education provided;Falls evaluation completed      Functional Status Survey: Is the patient deaf or have difficulty hearing?: Yes Does the patient have difficulty seeing, even when wearing glasses/contacts?: No Does the patient have difficulty concentrating, remembering, or making decisions?: Yes Does the patient  have difficulty walking or climbing stairs?: Yes Does the patient have difficulty dressing or bathing?: No Does the patient have difficulty doing errands alone such as visiting a doctor's office or shopping?: No    Assessment & Plan  1. Empty sella (HCC)  Stable   2. Mild protein-calorie malnutrition (HCC)  Increase protein intake   3. Atherosclerosis of aorta (HCC)  On statin therapy   4. Postinflammatory pulmonary fibrosis (HCC)  Seeing pulmonologist at [facility]  5. Chronic recurrent major depressive disorder (HCC)  Continue visits with psychiatrist and therapist   6. Neurogenic claudication  Stable on Lyrica   7. Benign hypertension  BP is at goal today   8. Chronic constipation  On  Linzess  9. Dyslipidemia  On statin therapy    10. Fibromyalgia  Taking Lyrica  11. Perennial allergic rhinitis with seasonal variation  - loratadine (CLARITIN) 10 MG tablet; Take 1 tablet (10 mg total) by mouth daily.  Dispense: 90 tablet; Refill: 1 - hydrOXYzine (ATARAX) 10 MG tablet; Take 1 tablet (10 mg total) by mouth 3 (three) times daily as needed.  Dispense: 30 tablet; Refill: 0 - fluticasone (FLONASE) 50 MCG/ACT nasal spray; Place 2 sprays into both nostrils daily.  Dispense: 48 g; Refill: 3

## 2023-02-28 NOTE — Unmapped (Signed)
02/28/23   CORE BREATHING   Position:  Can be done lying down, or sitting down   Technique:  As you inhale through your nose, imagine that your rib cage is opening like bird wings:   Your abdomen should expand to the front   Your ribcage should expand to the sides and back   Your pelvic floor should descend/drop   As you exhale through your mouth (pursed lips), the opposite happens: imagine your rib cage closing like bird wings   Your abdomen should draw in   Your ribcage should draw in/together   Your pelvic floor should gently draw up (controlled kegel)   Duration:  Repeat this sequence for 7-10 breaths.       Frequency:  Repeat 2-3 x per day     02-28-23   Toileting Posture   Sitting on toilet, place a footstool in front of the toilet so you can prop your feet up on it--you want your knees to be up higher than your hips.  Lean forward.  Other helpful strategies:   Do not hold your breath   Blow as if blowing through a straw, or blowing through a closed fist   [email or web address]             Bowel Habit Retraining   First thing in the morning:   Drink something warm   Wait 10 or so minutes while trying to stay on your feet (ex: walking, housework, showering)   Do the colon massage for 5-10 minutes.   Sit on the toilet for no more than 10 minutes with correct toileting posture (Do not try to force a bowel movement - pretend to blow through a straw). Can do some of the core breathing to relax the pelvic floor   Repeat each morning.        Colon Stimulation Massage      Lying on your back with your knees bent up, massage your abdomen in the following sequence:    Starting at the right lower quadrant (right inside your pelvic bone) massage in circles up to your ribs on the right side.   Massage in circles from the RIGHT ribcage to your LEFT ribcage   Massage in circles from the LEFT ribcage, down toward  your LEFT pelvic bone   Push from the L pelvic bone toward your pubic symphysis (to get stool ???out???)      Repeat this sequence 10 x, 1 x per day.  This massage should take you ~5-10 minutes.

## 2023-02-28 NOTE — Unmapped (Addendum)
[facility] PT [address] [place]  OUTPATIENT PHYSICAL THERAPY  02/28/2023          Patient Name: Caitlin Smith  Date of Birth:Apr 25, 1956  Diagnosis:   Encounter Diagnoses   Name Primary?    Chronic idiopathic constipation Yes    Pelvic floor dysfunction in female      Referring MD:  Corinda Gubler, *     Plan of Care Effective Date:   02/28/23        Assessment  Assessment details:    67 year old female presents in the clinic with worsening chronic constipation with pelvic floor dysfunction.  Further assessment of the patient's pelvic floor will be completed at a future session. Patient will likely benefit from skilled physical therapy to improve her ability to have a bowel movement at least every other day without straining through manual therapy, therapeutic exercise, patient education such as learning toilet posture and behavioral techniques to improve her pelvic floor function as well as a home exercise program.          Impairments: bowel dysfunction, pain and urinary incontinence                Prognosis: good prognosis    Positive Prognosis Rationale: motivated for treatment.  Negative Prognosis Rationale: chronicity of condition and severity of symptoms.    Barriers to therapy: transportation    Therapy Goals      Goals:      Short Term Goal - 4 weeks  1. Patient independent with HEP to learn improved toilet postures for effectively emptying her bowels.      Long Term Goals - 16 weeks  1.  Patient able to have a bowel movement every other day without straining.  2.  Patient able to sneeze without leaking urine.  3.  Patient able to feel that she is completely emptying her bowels.    Plan    Therapy options: will be seen for skilled physical therapy services    Planned therapy interventions: 97110-Therapeutic Exercises, 97112-Neuromuscular Re-education, 97140-Manual Therapy, 97530-Therapeutic Activities and 97535-Self-Care/Home Training      Frequency: 1x week    Duration in weeks: 16    Education provided to: patient.    Education provided: Teacher, music, HEP, Toileting posture and Self-soft tissue mobilization    Education results: needs reinforcement.      Next visit plan:        Assess pelvic floor as appropriate.    Total Session Time: 45    Treatment rendered today:      Self Care 20'  Patient educated at length on anatomy and biomechanics of the pelvic floor and GI system.  Patient educated on constipation packet including adding ground flax seed or the move it recipe to her daily program.  Patient educated on pretending to blow through a straw versus straining.  Patient educated on core breathing as an exercise and while on the toilet.  Discussed having a morning routine for bowel habits.  Handout given with toilet posture.  Patient educated on self colon massage. Handout given.            History of Present Condition     History of Present Condition/Chief Complaint:  Chronic constipation, pelvic floor dysfunction  Subjective:  Patient stated she has had constipation her whole life and it is getting worse. Patient stated she will not have a bowel movement without meds. Patient stated she does not eat a lot of bread and does  eat salads.  Patient stated she has been eating more not to lose weight and is wondering if her constipation is related to her weight gain.       Quality of life:  Fair  Pain:     Current pain rating:  7    At best pain rating:  6    At worst pain rating:  10  Pain Comments: Chronic pain - had a knee replacement on the right.  Did not heal well.  Patient stated she has a lot of pain in the left knee.  Patient chose not to replace the left knee at this time.  Patient with fibromyalgia and has lung disease so has to walk.      Quality:  Aching and sore    Physical limitation(s):  Knee pain  Current Functional Status:  Limited walking tolerance  Social Support:     Lives Environment:  Apartment and stairs (one flight)    Lives with:  Alone    Work/School:  Walks in the morning for 45-60 minutes to improve her lung disease  Treatments:     Current treatment: medication    Patient Goals:     Patient/Family goals for therapy:  Decrease/Eliminate UI and improve bowel function      OB/GYN History:  History of Pregnancy?: Yes  Pregnancy Type: Gravida, Para, Vaginal Deliveries  # of Gravida: 2   # of Para : 2     # of Vaginal Deliveries: 2         Sexual History:     Sexual Dysfunction: No (patient hasn't been sexually active, but woudl like to be,  She stated she did not have any sensation.)              Urinary Symptoms:  Fluid Intake: water - 40 oz, tea,  # of voids : 3, 4, per day       # of voids per night: per night, 2  Urinary urgency?: Yes  Urinary incontinence?: Yes       Urinary Incontinence types : SUI (sneeze or cough)                    Bowel Symptoms:  # of BMs: 0, 1, 2, per week (two capfuls a day of miralax but was making her sick.  Takes Linzess daily.  If takes a pill, it takes two to three days to work.)      Stool Type (based on [place] Stool Chart): V      Straining?: Yes     Fecal urgency?: No      Fecal incontinence?: No                    Postural Observations  Seated posture: fair  Standing posture: fair      Ambulation   Observational Gait   Gait: antalgic   Decreased walking speed and stride length.           I attest that I have reviewed the above information.  Signed: Gaylyn Lambert, PT  02/28/2023 3:04 PM      All information regarding the expectations of participation in pelvic floor physical therapy, including but not limited to, the need for internal and/or external pelvic floor assessment, treatment techniques and plan of care were thoroughly discussed.  Patient confirms understanding that (s)he may bring a third party to any or all physical therapy sessions and can withdraw consent at any point throughout the plan  of care.  Patient and guardian (if applicable) verbalize understanding of all above information and consent to pelvic floor assessment and treatment at evaluation and all future physical therapy sessions.

## 2023-03-01 ENCOUNTER — Inpatient Hospital Stay: Payer: 59

## 2023-03-01 ENCOUNTER — Inpatient Hospital Stay (HOSPITAL_BASED_OUTPATIENT_CLINIC_OR_DEPARTMENT_OTHER): Payer: 59 | Admitting: Licensed Clinical Social Worker

## 2023-03-01 ENCOUNTER — Encounter: Payer: Self-pay | Admitting: Family Medicine

## 2023-03-01 ENCOUNTER — Ambulatory Visit (INDEPENDENT_AMBULATORY_CARE_PROVIDER_SITE_OTHER): Payer: 59 | Admitting: Family Medicine

## 2023-03-01 ENCOUNTER — Encounter: Payer: Self-pay | Admitting: Licensed Clinical Social Worker

## 2023-03-01 VITALS — BP 118/70 | HR 81 | Resp 16 | Ht 68.0 in | Wt 204.0 lb

## 2023-03-01 DIAGNOSIS — K5909 Other constipation: Secondary | ICD-10-CM | POA: Diagnosis not present

## 2023-03-01 DIAGNOSIS — R29818 Other symptoms and signs involving the nervous system: Secondary | ICD-10-CM | POA: Diagnosis not present

## 2023-03-01 DIAGNOSIS — I700 Atherosclerosis of aorta: Secondary | ICD-10-CM

## 2023-03-01 DIAGNOSIS — J302 Other seasonal allergic rhinitis: Secondary | ICD-10-CM | POA: Diagnosis not present

## 2023-03-01 DIAGNOSIS — E785 Hyperlipidemia, unspecified: Secondary | ICD-10-CM

## 2023-03-01 DIAGNOSIS — E236 Other disorders of pituitary gland: Secondary | ICD-10-CM

## 2023-03-01 DIAGNOSIS — J3089 Other allergic rhinitis: Secondary | ICD-10-CM

## 2023-03-01 DIAGNOSIS — M797 Fibromyalgia: Secondary | ICD-10-CM | POA: Diagnosis not present

## 2023-03-01 DIAGNOSIS — J8410 Pulmonary fibrosis, unspecified: Secondary | ICD-10-CM

## 2023-03-01 DIAGNOSIS — F339 Major depressive disorder, recurrent, unspecified: Secondary | ICD-10-CM

## 2023-03-01 DIAGNOSIS — Z800 Family history of malignant neoplasm of digestive organs: Secondary | ICD-10-CM

## 2023-03-01 DIAGNOSIS — I10 Essential (primary) hypertension: Secondary | ICD-10-CM | POA: Diagnosis not present

## 2023-03-01 DIAGNOSIS — E441 Mild protein-calorie malnutrition: Secondary | ICD-10-CM | POA: Diagnosis not present

## 2023-03-01 DIAGNOSIS — Z803 Family history of malignant neoplasm of breast: Secondary | ICD-10-CM | POA: Diagnosis not present

## 2023-03-01 MED ORDER — HYDROXYZINE HCL 10 MG PO TABS: 10.0000 mg | ORAL_TABLET | Freq: Three times a day (TID) | ORAL | 0 refills | Status: DC | PRN

## 2023-03-01 MED ORDER — LORATADINE 10 MG PO TABS: 10.0000 mg | ORAL_TABLET | Freq: Every day | ORAL | 1 refills | Status: DC

## 2023-03-01 MED ORDER — FLUTICASONE PROPIONATE 50 MCG/ACT NA SUSP
2.0000 | Freq: Every day | NASAL | 3 refills | Status: AC
Start: 2023-03-01 — End: ?

## 2023-03-01 MED FILL — MYCOPHENOLATE SODIUM 360 MG TABLET,DELAYED RELEASE: ORAL | 30 days supply | Qty: 120 | Fill #2

## 2023-03-01 NOTE — Progress Notes (Signed)
REFERRING PROVIDER: Rickard Patience, MD [address] [place],  [place] [address]  PRIMARY PROVIDER:  Alba Cory, MD  PRIMARY REASON FOR VISIT:  1. Family history of breast cancer   2. Family history of stomach cancer   3. Family history of colon cancer      HISTORY OF PRESENT ILLNESS:   Nicole Ballard, a 67 y.o. female, was seen for a [place] cancer genetics consultation at the request of Dr. Cathie Hoops due to a family history of breast cancer.  Nicole Ballard presents to clinic today to discuss the possibility of a hereditary predisposition to cancer, genetic testing, and to further clarify her future cancer risks, as well as potential cancer risks for family members.   CANCER HISTORY:  Nicole Ballard is a 67 y.o. female with no personal history of cancer.    RISK FACTORS:  Menarche was at age 43.  First live birth at age 89.  Ovaries intact: yes.  Hysterectomy: yes.  Menopausal status: postmenopausal.  HRT use: 0 years. Colonoscopy: yes;  1 polyp . Mammogram within the last year: yes. Number of breast biopsies: 1. Up to date with pelvic exams: yes.  Past Medical History:  Diagnosis Date   Anxiety    Bipolar 1 disorder (HCC)    Chronic kidney disease    Chronic pain    Diabetes mellitus without complication (HCC)    Fibromyalgia    GERD (gastroesophageal reflux disease)    Hearing loss of right ear    Hemifacial spasm    Hyperlipidemia    Hypertension    Insomnia    Lumbago    Lung disease    Osteoarthritis    Paresthesia    Rhinitis    Systemic lupus erythematosus (HCC)    Vitamin D deficiency     Past Surgical History:  Procedure Laterality Date   BRAIN SURGERY     CHOLECYSTECTOMY     VAGINAL HYSTERECTOMY     vulvar surgery      FAMILY HISTORY:  We obtained a detailed, 4-generation family history.  Significant diagnoses are listed below: Family History  Problem Relation Age of Onset   Diabetes Mother    Hypertension Mother    Breast cancer Mother     Stomach cancer Father        d.>50   Breast cancer Maternal Aunt        dx >50   Breast cancer Maternal Aunt        dx 1s   Breast cancer Maternal Aunt    Cancer Maternal Uncle        unk type   Cancer Maternal Uncle        unk type   Breast cancer Cousin        dx 96s   Breast cancer Cousin        dx 30s, does not want genetic testing   Nicole Ballard has 1 son, 65, and 1 daughter, 42. She has 2 full sisters and 2 maternal half sisters. None have had cancer.  Nicole Ballard mother was diagnosed with breast cancer in her early 58s and died in her 38s from metastatic disease. Patient had 3 maternal aunts, all have had breast cancer over the age of 88. Two maternal uncles had cancer, unknown types. One maternal cousin had breast cancer in her 34s and died of it in her 30s. Another cousin had breast cancer in her 30s and is living, but does not want genetic testing.   Ms.  Ballard's father had stomach cancer over the age of 77 and died over 53. Paternal aunt had breast cancer. Paternal uncle died from colon cancer a young age, unknown exact age.   Nicole Ballard is unaware of previous family history of genetic testing for hereditary cancer risks. There is no reported Ashkenazi Jewish ancestry. There is no known consanguinity.    GENETIC COUNSELING ASSESSMENT: Nicole Ballard is a 67 y.o. female with a family history of breast cancer which is somewhat suggestive of a hereditary cancer syndrome and predisposition to cancer. We, therefore, discussed and recommended the following at today's visit.   DISCUSSION: We discussed that approximately 10% of breast cancer is hereditary. Most cases of hereditary breast cancer are associated with BRCA1/BRCA2 genes, although there are other genes associated with hereditary cancer as well. Cancers and risks are gene specific. We discussed that testing is beneficial for several reasons including knowing about cancer risks, identifying potential screening and  risk-reduction options that may be appropriate, and to understand if other family members could be at risk for cancer and allow them to undergo genetic testing.   We reviewed the characteristics, features and inheritance patterns of hereditary cancer syndromes. We also discussed genetic testing, including the appropriate family members to test, the process of testing, insurance coverage and turn-around-time for results. We discussed the implications of a negative, positive and/or variant of uncertain significant result. We recommended Nicole Ballard pursue genetic testing for the [facility] Multi-Cancer+RNA gene panel.   Based on Nicole Ballard's maternal family history of breast cancer (6 close maternal relatives with breast cancer, two diagnosed under 50) , she meets medical criteria for genetic testing. Despite that she meets criteria, she may still have an out of pocket cost. We discussed that if her out of pocket cost for testing is over $100, the laboratory will call and confirm whether she wants to proceed with testing.  If the out of pocket cost of testing is less than $100 she will be billed by the genetic testing laboratory.   PLAN: After considering the risks, benefits, and limitations, Nicole Ballard provided informed consent to pursue genetic testing and the blood sample was sent to [facility] for analysis of the Multi-Cancer+RNA panel. Results should be available within approximately 2-3 weeks' time, at which point they will be disclosed by telephone to Nicole Ballard, as will any additional recommendations warranted by these results. Nicole Ballard will receive a summary of her genetic counseling visit and a copy of her results once available. This information will also be available in Epic.   Nicole Ballard questions were answered to her satisfaction today. Our contact information was provided should additional questions or concerns arise. Thank you for the referral and allowing [place] to share in  the care of your patient.   Lacy Duverney, MS, [facility] Genetic Counselor [email or web address] Phone: [phone]  The patient was seen for a total of 35 minutes in face-to-face genetic counseling.  Dr. Orlie Dakin was available for discussion regarding this case.   _______________________________________________________________________ For Office Staff:  Number of people involved in session: 2 Was an Intern/ student involved with case: yes; [employer] intern Haskell Flirt assisted with this case.

## 2023-03-03 NOTE — Unmapped (Signed)
Patient called with burning and itching ou. Advised PF tears ou 6-8 times a day.

## 2023-03-13 ENCOUNTER — Telehealth: Payer: Self-pay | Admitting: Licensed Clinical Social Worker

## 2023-03-13 NOTE — Telephone Encounter (Signed)
I contacted Ms. Bartel to discuss her genetic testing results. No pathogenic variants were identified in the 70 genes analyzed. Detailed clinic note to follow.   The test report has been scanned into EPIC and is located under the Molecular Pathology section of the Results Review tab.  A portion of the result report is included below for reference.      Lacy Duverney, MS, [facility] Genetic Counselor [email or web address] Phone: [phone]

## 2023-03-13 NOTE — Unmapped (Unsigned)
Otolaryngology Return Swallow Visit        History of Present Illness:  Caitlin Smith is a 67 y.o. 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      At the end of our previous clinic visit we discussed and recommended clinical course of Respiratory Retraining, Biotene mouthwash, D/C Listerine- switch to salt water gargles      July 2017: I recommended NeilMed rinses and a workup of glossitis (electrolyte and vitamin levels per primary care physician: B12, zinc, etc.,) Her diagnoses included right vocal fold hypomobility/depression anxiety. 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     December 2018: The patient notes that her symptoms have been relatively stable.  She continues to complain of burning tongue.  Following this visit I recommended a thyroid hormone panel and thyroid ultrasound.     August 2019: The patient reports that she continues to clear there throat and cough in a consistent manner.  Thyroid ultrasound was unremarkable w/o nodules. She occasionally (rarely ) awakens from sleep with cough. She has done a few sessions with SLP, but claims to have forgotten the techniques.  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  I additionally offered behavioral intervention for her cough symptom.     April 2021: In the interim the patient is followed up with neurosurgery and has had radiography demonstrating no additional lesions.  The patient did however complain of right-sided head throbbing, and otalgia.  Caitlin Smith notes that she has continued to have balance difficulty, intermittent cough and dysphonia.  Lastly, she reports that her throat has been burning more than in the past though she has not seen her GI physician for some time.  The patient reports that she has had ongoing right otalgia that she is treated with hydrogen peroxide.  She has had her ears flushed by her primary care physician without resolution.  The patient was referred to otology for right otalgia.     June 2022: Patient returns with concerns for weak vocal projection, worsened over the past several weeks. She attributes this to her LPR, but has been taking her daily Prilosec with minimal relief. Otherwise, she has been doing well, without any additional concerns. Given the stability of the patient's physical examination and findings referable to upper airway inflammation, I have a recommended that we proceed with management of her known LPR. Add Famotidine 20 mg BID to supplement Prilosec 40 mg     October 2022: The patient returns for follow-up. She states that her voice continues to be hoarse. The patient endorses seasonal allergies. She states that she follows up with pulmonology and was started on albuterol inhaler. She endorses coughing symptoms that has been a past ongoing issue. The patient states that she has the sensation of food getting caught in her epigastric region. She endorses abdominal pain, right under the ribcage, for the past 4-5 months and has not seen GI. She describes it as a dull pain that will last for a few minutes before going away. She states that the abdominal pain will occur randomly at rest and has generally not been benefited by reflux medication. She endorses difficulty with bowel movement, which has been an ongoing issue in the past.I reassured the patient that her larynx appears healthy on laryngoscopy today. I recommend NeilMed nasal saline  irrigation for general nasopharyngeal hygiene. I will refer the patient to GI for further evaluation of gastric discomfort and constipation.  I offered a referral to GI for gastric discomfort and constipation, and recommended NeilMed nasal irrigations.     October 2023: The patient coughs consistently  with pain in the right ear.  The cough comes throughout the day without clear inciting behaviors.  She needs water to calm it down.  The patient reports having tried reflux medication, particularly due to the fact that she also reports some burning in the throat, however this was unhelpful for her cough symptom.  The patient also reports achiness in the right ear (the same side as her previous skull base retroauricular approach). We discussed interventions for chronic cough (putative neurogenic cough).  These include neuromodulator medicines (patient currently on gabapentin and less interested in beginning another chronic medication), as well as interventions such as SLN injection, and intra laryngeal Botox.  We have decided to proceed to trial of SLN injection on the right, given the prevalence of her symptoms on the right (ear, vocal fold hypomobility)     December 2023: Over the interval since her last visit the patient reports that her cough has been persistent slightly of less intensity recently.  She also notes that she has had some unintended weight loss, and has a specific concern that she may have esophageal cancer, having heard and anecdote about another patient (albeit with no specific current upper GI symptoms).  She request screening for this.     August 17, 2022: The patient underwent right superior laryngeal nerve injection with steroid/local anesthetic.     January 2024: We reviewed the barium swallow results with the patient today.  There was no evidence of significant strictures or mucosal abnormalities.  There was notable stasis of the barium column within the esophagus after the swallow, however.  The patient reports that she has had approximately an 80% improvement in her frequency and severity of cough following her right superior laryngeal nerve injection she is quite pleased with this.  We discussed the role for further workup of esophageal dysmotility through GI and some of the likely tests to be performed.  The patient has opted to forego further workup of this complaint at this point..    July 2024:         Interval Surgery/Medical History:    Interval  Past Medical History and ROS is otherwise unchanged except as per HPI      Medications:    Current Outpatient Medications:     albuterol HFA 90 mcg/actuation inhaler, Inhale 2 puffs every six (6) hours as needed for wheezing., Disp: 8 g, Rfl: 11    amLODIPine (NORVASC) 2.5 MG tablet, Take 3 tablets (7.5 mg total) by mouth daily., Disp: , Rfl:     aspirin (ECOTRIN) 81 MG tablet, Take 1 tablet (81 mg total) by mouth daily., Disp: , Rfl:     atenolol (TENORMIN) 25 MG tablet, TAKE 1 AND 1/2 TABLETS BY MOUTH  TWICE DAILY, Disp: 300 tablet, Rfl: 1    atorvastatin (LIPITOR) 40 MG tablet, Take 1 tablet (40 mg total) by mouth daily., Disp: , Rfl:     biotin 1 mg cap, Take by mouth., Disp: , Rfl:     buPROPion (WELLBUTRIN XL) 300 MG 24 hr tablet, Take 1 tablet (300 mg total) by mouth daily., Disp: 90 tablet, Rfl: 3    clonazePAM (KLONOPIN) 0.5 MG tablet, Take 0.5 tablets (0.25 mg total) by mouth  Three (3) times a day as needed for anxiety., Disp: 30 tablet, Rfl: 1    clotrimazole-betamethasone (LOTRISONE) 1-0.05 % cream, Apply 1 Application topically two (2) times a day., Disp: , Rfl:     cyclobenzaprine (FLEXERIL) 5 MG tablet, Take 1 tablet (5 mg total) by mouth nightly as needed for muscle spasms., Disp: 60 tablet, Rfl: 2    diclofenac sodium (VOLTAREN) 1 % gel, Apply 2 g topically two (2) times a day as needed for arthritis., Disp: 300 g, Rfl: 5    dicyclomine (BENTYL) 20 mg tablet, Take 1 tablet (20 mg total) by mouth daily as needed., Disp: , Rfl:     famotidine (PEPCID) 20 MG tablet, TAKE 1 TABLET BY MOUTH TWICE  DAILY, Disp: 200 tablet, Rfl: 2    fluticasone propionate (FLONASE) 50 mcg/actuation nasal spray, , Disp: , Rfl:     hydrALAZINE (APRESOLINE) 10 MG tablet, , Disp: , Rfl:     hydroxychloroquine (PLAQUENIL) 200 mg tablet, Take 1 tablet (200 mg total) by mouth two (2) times a day., Disp: 180 tablet, Rfl: 3    hydrOXYzine (ATARAX) 10 MG tablet, , Disp: , Rfl:     inhalational spacing device (AEROCHAMBER MV) Spcr, 1 each by Miscellaneous route two (2) times a day. With [employer], Disp: 1 each, Rfl: 0    lamoTRIgine (LAMICTAL) 200 MG tablet, TAKE 1 TABLET BY MOUTH TWICE  DAILY, Disp: 200 tablet, Rfl: 2    lidocaine (XYLOCAINE) 5 % ointment, Apply topically two (2) times a day., Disp: 100 g, Rfl: 2    linaCLOtide (LINZESS) 290 mcg capsule, Take 1 capsule (290 mcg total) by mouth daily., Disp: 30 capsule, Rfl: 5    loratadine (CLARITIN) 10 mg tablet, Take 1 tablet (10 mg total) by mouth daily., Disp: , Rfl:     melatonin 10 mg Tab, Take 1 tablet by mouth., Disp: , Rfl:     metFORMIN (GLUCOPHAGE-XR) 500 MG 24 hr tablet, Take 1 tablet (500 mg total) by mouth daily before breakfast., Disp: , Rfl:     mupirocin (BACTROBAN) 2 % ointment, APP EXT IEN BID, Disp: , Rfl: 0    mycophenolate (MYFORTIC) 360 MG TbEC, Take 2 tablets (720 mg total) by mouth two (2) times a day., Disp: 360 tablet, Rfl: 3    olopatadine (PATANOL) 0.1 % ophthalmic solution, Apply 1 drop to eye daily., Disp: , Rfl:     omeprazole (PRILOSEC) 40 MG capsule, Take 1 capsule (40 mg total) by mouth two (2) times a day., Disp: 180 capsule, Rfl: 3    ondansetron (ZOFRAN) 4 MG tablet, , Disp: , Rfl:     polyethylene glycol (GLYCOLAX) 17 gram/dose powder, Take 17 g by mouth daily., Disp: , Rfl:     predniSONE (DELTASONE) 1 MG tablet, Take 4 tablets (4 mg total) by mouth daily for 30 days, THEN 3 tablets (3 mg total) daily for 30 days, THEN 2 tablets (2 mg total) daily for 30 days, THEN 1 tablet (1 mg total) daily., Disp: 300 tablet, Rfl: 0    pregabalin (LYRICA) 50 MG capsule, TAKE 1 CAPSULE (50 MG TOTAL) BY MOUTH THREE (3) TIMES A DAY., Disp: 90 capsule, Rfl: 1    telmisartan (MICARDIS) 80 MG tablet, Take 1 tablet (80 mg total) by mouth., Disp: , Rfl:     traZODone (DESYREL) 100 MG tablet, Take 1.5 tablets (150 mg total) by mouth nightly., Disp: 135 tablet, Rfl: 3         Pertinent ROS  as above and per HPI.     Physical Exam:   Constitutional:  Vitals reviewed on nursing chart.  Voice: ***  Respiration:  Breathing comfortably, no stridor.   Ears:  Normal tympanic membranes to otoscopy,   Nose:  External nose midline, anterior rhinoscopy is normal with limited visualization just to the anterior interior turbinate.   Oral Cavity/Oropharynx/Lips:  Normal mucous membranes, normal floor of mouth/tongue/oropharynx, no masses or lesions are noted.    Pharyngeal Walls:  No masses noted.  Neck/Lymph:  No lymphadenopathy, no thyroid masses.        Procedure:  03/16/2023 ***      Swallow Evaluation:  ***      EAT-10: ***      Assessment:   67 y.o. 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      At the end of our previous clinic visit we discussed and recommended clinical course of Respiratory Retraining, Biotene mouthwash, D/C Listerine- switch to salt water gargles      July 2017: I recommended NeilMed rinses and a workup of glossitis (electrolyte and vitamin levels per primary care physician: B12, zinc, etc.,) Her diagnoses included right vocal fold hypomobility/depression anxiety. 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     December 2018: The patient notes that her symptoms have been relatively stable.  She continues to complain of burning tongue.  Following this visit I recommended a thyroid hormone panel and thyroid ultrasound.     August 2019: The patient reports that she continues to clear there throat and cough in a consistent manner.  Thyroid ultrasound was unremarkable w/o nodules. She occasionally (rarely ) awakens from sleep with cough. She has done a few sessions with SLP, but claims to have forgotten the techniques.  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  I additionally offered behavioral intervention for her cough symptom.     April 2021: In the interim the patient is followed up with neurosurgery and has had radiography demonstrating no additional lesions.  The patient did however complain of right-sided head throbbing, and otalgia.  Caitlin Smith notes that she has continued to have balance difficulty, intermittent cough and dysphonia.  Lastly, she reports that her throat has been burning more than in the past though she has not seen her GI physician for some time.  The patient reports that she has had ongoing right otalgia that she is treated with hydrogen peroxide.  She has had her ears flushed by her primary care physician without resolution.  The patient was referred to otology for right otalgia.     June 2022: Patient returns with concerns for weak vocal projection, worsened over the past several weeks. She attributes this to her LPR, but has been taking her daily Prilosec with minimal relief. Otherwise, she has been doing well, without any additional concerns. Given the stability of the patient's physical examination and findings referable to upper airway inflammation, I have a recommended that we proceed with management of her known LPR. Add Famotidine 20 mg BID to supplement Prilosec 40 mg     October 2022: The patient returns for follow-up. She states that her voice continues to be hoarse. The patient endorses seasonal allergies. She states that she follows up with pulmonology and was started on albuterol inhaler. She endorses coughing symptoms that  has been a past ongoing issue. The patient states that she has the sensation of food getting caught in her epigastric region. She endorses abdominal pain, right under the ribcage, for the past 4-5 months and has not seen GI. She describes it as a dull pain that will last for a few minutes before going away. She states that the abdominal pain will occur randomly at rest and has generally not been benefited by reflux medication. She endorses difficulty with bowel movement, which has been an ongoing issue in the past.I reassured the patient that her larynx appears healthy on laryngoscopy today. I recommend NeilMed nasal saline irrigation for general nasopharyngeal hygiene. I will refer the patient to GI for further evaluation of gastric discomfort and constipation.  I offered a referral to GI for gastric discomfort and constipation, and recommended NeilMed nasal irrigations.     October 2023: The patient coughs consistently  with pain in the right ear.  The cough comes throughout the day without clear inciting behaviors.  She needs water to calm it down.  The patient reports having tried reflux medication, particularly due to the fact that she also reports some burning in the throat, however this was unhelpful for her cough symptom.  The patient also reports achiness in the right ear (the same side as her previous skull base retroauricular approach). We discussed interventions for chronic cough (putative neurogenic cough).  These include neuromodulator medicines (patient currently on gabapentin and less interested in beginning another chronic medication), as well as interventions such as SLN injection, and intra laryngeal Botox.  We have decided to proceed to trial of SLN injection on the right, given the prevalence of her symptoms on the right (ear, vocal fold hypomobility)     December 2023: Over the interval since her last visit the patient reports that her cough has been persistent slightly of less intensity recently.  She also notes that she has had some unintended weight loss, and has a specific concern that she may have esophageal cancer, having heard and anecdote about another patient (albeit with no specific current upper GI symptoms).  She request screening for this.     August 17, 2022: The patient underwent right superior laryngeal nerve injection with steroid/local anesthetic.     January 2024: We reviewed the barium swallow results with the patient today.  There was no evidence of significant strictures or mucosal abnormalities.  There was notable stasis of the barium column within the esophagus after the swallow, however.  The patient reports that she has had approximately an 80% improvement in her frequency and severity of cough following her right superior laryngeal nerve injection she is quite pleased with this.  We discussed the role for further workup of esophageal dysmotility through GI and some of the likely tests to be performed.  The patient has opted to forego further workup of this complaint at this point..    July 2024:    1. LPR  2. Slightly enlarged right Thyroid gland/ no dominant nodule  3. Burning mouth syndrome    4. Cough   5. MTD  6. Epigastric pain   7. Evidence of esouphageal dysmotility    Plan:  ***    This note was created with [employer] and may have errors that were not dictated and not seen in editing.     Elana Alm  Dayani Winbush

## 2023-03-14 ENCOUNTER — Ambulatory Visit: Payer: Self-pay | Admitting: Licensed Clinical Social Worker

## 2023-03-14 ENCOUNTER — Encounter: Payer: Self-pay | Admitting: Licensed Clinical Social Worker

## 2023-03-14 ENCOUNTER — Telehealth: Admit: 2023-03-14 | Discharge: 2023-03-15 | Payer: MEDICARE | Attending: Psychologist | Primary: Psychologist

## 2023-03-14 DIAGNOSIS — Z1379 Encounter for other screening for genetic and chromosomal anomalies: Secondary | ICD-10-CM

## 2023-03-14 DIAGNOSIS — F4310 Post-traumatic stress disorder, unspecified: Principal | ICD-10-CM

## 2023-03-14 DIAGNOSIS — F331 Major depressive disorder, recurrent, moderate: Principal | ICD-10-CM

## 2023-03-14 DIAGNOSIS — F411 Generalized anxiety disorder: Principal | ICD-10-CM

## 2023-03-14 DIAGNOSIS — F410 Panic disorder [episodic paroxysmal anxiety] without agoraphobia: Principal | ICD-10-CM

## 2023-03-14 NOTE — Progress Notes (Signed)
HPI:   Nicole Ballard was previously seen in the [facility] due to a family history of breast cancer and concerns regarding a hereditary predisposition to cancer. Please refer to our prior cancer genetics clinic note for more information regarding our discussion, assessment and recommendations, at the time. Nicole Ballard recent genetic test results were disclosed to her, as were recommendations warranted by these results. These results and recommendations are discussed in more detail below.  CANCER HISTORY:  Oncology History   No history exists.    FAMILY HISTORY:  We obtained a detailed, 4-generation family history.  Significant diagnoses are listed below: Family History  Problem Relation Age of Onset   Diabetes Mother    Hypertension Mother    Breast cancer Mother    Stomach cancer Father        d.>50   Breast cancer Maternal Aunt        dx >50   Breast cancer Maternal Aunt        dx 49s   Breast cancer Maternal Aunt    Cancer Maternal Uncle        unk type   Cancer Maternal Uncle        unk type   Breast cancer Cousin        dx 16s   Breast cancer Cousin        dx 30s, does not want genetic testing   Nicole Ballard's mother was diagnosed with breast cancer in her early 47s and died in her 60s from metastatic disease. Patient had 3 maternal aunts, all have had breast cancer over the age of 72. Two maternal uncles had cancer, unknown types. One maternal cousin had breast cancer in her 34s and died of it in her 30s. Another cousin had breast cancer in her 30s and is living, but does not want genetic testing.    Nicole Ballard father had stomach cancer over the age of 66 and died over 62. Paternal aunt had breast cancer. Paternal uncle died from colon cancer a young age, unknown exact age.    Nicole Ballard is unaware of previous family history of genetic testing for hereditary cancer risks. There is no reported Ashkenazi Jewish ancestry. There is no known  consanguinity.      GENETIC TEST RESULTS:  The [facility] Multi-Cancer+RNA Panel found no pathogenic mutations.   The Multi-Cancer + RNA Panel offered by [facility] includes sequencing and/or deletion/duplication analysis of the following 70 genes:  AIP*, ALK, APC*, ATM*, AXIN2*, BAP1*, BARD1*, BLM*, BMPR1A*, BRCA1*, BRCA2*, BRIP1*, CDC73*, CDH1*, CDK4, CDKN1B*, CDKN2A, CHEK2*, CTNNA1*, DICER1*, EPCAM, EGFR, FH*, FLCN*, GREM1, HOXB13, KIT, LZTR1, MAX*, MBD4, MEN1*, MET, MITF, MLH1*, MSH2*, MSH3*, MSH6*, MUTYH*, NF1*, NF2*, NTHL1*, PALB2*, PDGFRA, PMS2*, POLD1*, POLE*, POT1*, PRKAR1A*, PTCH1*, PTEN*, RAD51C*, RAD51D*, RB1*, RET, SDHA*, SDHAF2*, SDHB*, SDHC*, SDHD*, SMAD4*, SMARCA4*, SMARCB1*, SMARCE1*, STK11*, SUFU*, TMEM127*, TP53*, TSC1*, TSC2*, VHL*. RNA analysis is performed for * genes.   The test report has been scanned into EPIC and is located under the Molecular Pathology section of the Results Review tab.  A portion of the result report is included below for reference. Genetic testing reported out on 03/08/2023.      Even though a pathogenic variant was not identified, possible explanations for the cancer in the family may include: There may be no hereditary risk for cancer in the family. The cancers in Nicole Ballard and/or her family may be sporadic/familial or due to other genetic and environmental factors. There may be a  gene mutation in one of these genes that current testing methods cannot detect but that chance is small. There could be another gene that has not yet been discovered, or that we have not yet tested, that is responsible for the cancer diagnoses in the family.  It is also possible there is a hereditary cause for the cancer in the family that Nicole Ballard did not inherit.  Therefore, it is important to remain in touch with cancer genetics in the future so that we can continue to offer Nicole Ballard the most up to date genetic testing.    ADDITIONAL GENETIC TESTING:  We  discussed with Nicole Ballard that her genetic testing was fairly extensive.  If there are additional relevant genes identified to increase cancer risk that can be analyzed in the future, we would be happy to discuss and coordinate this testing at that time.    CANCER SCREENING RECOMMENDATIONS:  Nicole Ballard test result is considered negative (normal).  This means that we have not identified a hereditary cause for her family history of cancer at this time.   An individual's cancer risk and medical management are not determined by genetic test results alone. Overall cancer risk assessment incorporates additional factors, including personal medical history, family history, and any available genetic information that may result in a personalized plan for cancer prevention and surveillance. Therefore, it is recommended she continue to follow the cancer management and screening guidelines provided by her  primary healthcare provider.  Based on the reported personal and family history, specific cancer screenings for Nicole Ballard and her family include:  Breast Cancer Screening:  Based on Nicole Ballard's personal and family history of cancer as well as her genetic test results, risk model Rockne Menghini was used to estimate her risk of developing breast cancer. This estimates her lifetime risk of developing breast cancer to be approximately 12%.  The patient's lifetime breast cancer risk is a preliminary estimate based on available information using one of several models endorsed by the [employer] (NCCN). The NCCN recommends consideration of breast MRI screening as an adjunct to mammography for patients at high risk (defined as 20% or greater lifetime risk).  This risk estimate can change over time and may be repeated to reflect new information in her personal or family history in the future.  RECOMMENDATIONS FOR FAMILY MEMBERS:   Since she did not inherit a identifiable  mutation in a cancer predisposition gene included on this panel, her children could not have inherited a known mutation from her in one of these genes. Individuals in this family might be at some increased risk of developing cancer, over the general population risk, due to the family history of cancer.  Individuals in the family should notify their providers of the family history of cancer. We recommend women in this family have a yearly mammogram beginning at age 69, or 36 years younger than the earliest onset of cancer, an annual clinical breast exam, and perform monthly breast self-exams.  Family members should have colonoscopies by at age 46, or earlier, as recommended by their providers. Other members of the family may still carry a pathogenic variant in one of these genes that Nicole Ballard did not inherit. Based on the family history, we recommend her maternal relatives, especially those who have had cancer, to have genetic counseling and testing. Nicole Ballard will let us know if we can be of any assistance in coordinating genetic counseling and/or testing for this family  member.     FOLLOW-UP:  Lastly, we discussed with Nicole Ballard that cancer genetics is a rapidly advancing field and it is possible that new genetic tests will be appropriate for her and/or her family members in the future. We encouraged her to remain in contact with cancer genetics on an annual basis so we can update her personal and family histories and let her know of advances in cancer genetics that may benefit this family.   Our contact number was provided. Nicole Ballard questions were answered to her satisfaction, and she knows she is welcome to call us at anytime with additional questions or concerns.    Lacy Duverney, MS, [facility] Genetic Counselor [email or web address] Phone: [phone]

## 2023-03-14 NOTE — Unmapped (Signed)
[facility]   Confidential Psychological Therapy Session      Patient Name: Caitlin Smith  Medical Record Number: [id number]  Date of Service: March 14, 2023  Attending Psychologist: Caroline More, PhD  CPT Procedure Codes: 81191 for 45 mins of face to face counseling    This visit was performed face to face with interactive technology using a HIPPA compliant audio/visual platform. We reviewed confidentiality today. The patient was present in [place], a state in which this provider is licensed and able to provide care (location and contact information confirmed), attended this visit alone, and consented to this virtual pain psychology visit.    REFERRING PHYSICIAN: Clarene Essex, MD    CHIEF COMPLAINT AND REASON FOR VISIT: pain coping skills, CBT to address depression and anxiety in the setting of chronic pain    SUBJECTIVE / HISTORY OF PRESENT ILLNESS: [name] is a very pleasant 67 y.o.  female from [place], [place] with multiple chronic pain complaints related to fibromyalgia and lupus who initially met with me in October 2016, at which time she was diagnosed with severe depression, PTSD, panic disorder, and generalized anxiety. The patient returns for a therapy session today. Last follow up with me was in 11/07/2022.    The patient participates actively today.  Pt shares concerns related to neurological functioning, cognitive status and balance. Saw her neurologist and is now scheduled for a brain MRI - tomorrow morning. Is leaning to the right and even driving feels off so she transitioned her visit today to virtual versus in person because she doesn't feel safe driving on the highway. Pt shares concerns, fears and anxiety. Has shared with her son. Is supported and will see what her MRI shows. Pt also fell recently. Processed thoughts and feelings using a mindfulness approach. Also note she feels sad, because I don't understand it. Reviewed problem solving today to ensure safety given symptoms and concerns.     OBJECTIVE / MENTAL STATUS:    Appearance:   Appears stated age and Clean/Neat   Motor:  No abnormal movements   Speech/Language:   Normal rate, volume, tone, fluency   Mood:  Depressed and Anxious   Affect:  euthymic   Thought process:  Logical, linear, clear, coherent, goal directed   Thought content:    Denies SI, HI, self harm, delusions, obsessions, paranoid ideation, or ideas of reference   Perceptual disturbances:    Denies auditory and visual hallucinations, behavior not concerning for response to internal stimuli   Orientation:  Oriented to person, place, time, and general circumstances   Attention:  Able to fully attend without fluctuations in consciousness   Concentration:  Able to fully concentrate and attend   Memory:  Immediate, short-term, long-term, and recall grossly intact    Fund of knowledge:   Consistent with level of education and development   Insight:    Fair   Judgment:   Intact   Impulse Control:  Intact     DIAGNOSTIC IMPRESSION:   Post Traumatic Stress Disorder (PTSD)  Generalized Anxiety Disorder (GAD)  Panic Disorder  Major Depressive Disorder, moderate, recurrent  Chronic pain syndrome  Fibromyalgia  Lupus    ASSESSMENT:   [name] is a very pleasant 67 y.o.  female from [place], [place] with multiple chronic pain complaints related to lupus, arthritis, and fibromyalgia. She was previously seen in our clinic from 2012-2014, and reestablished care with Dr. Fayrene Fearing in August 2016. The patient has struggled with  depression and anxiety, but is very motivated to participate in intensive, multidisciplinary treatment to address the connection between depression, anxiety and pain. She has a long-standing history of depression and anxiety, and has worked with outpatient psychiatrist and therapist over the years. She is currently established with [facility] psychiatry.  In general, depression, anxiety, pain coping and panic have all improved (until recent bout with pulmonary issues), but the patient evidences a seasonal affective pattern of mood changes typically her mood tends to be worse during the winter.  Encouraged early morning sunlight and getting outside and walking as tolerated, building cardiovascular and muscular strength and endurance over time. Also encouraged practice of daily behavioral relaxation exercises as well.     Most recently has had neurological symptoms, and saw her neurologist, has a brain MRI scheduled for tomorrow.     PLAN:   (1) Psychotherapy - Continue CBT. CBT will be used to address PTSD, MDD, Panic D/o, GAD, and chronic pain.     --Behavioral Activation - Encouraged behavioral activation.  Is trying. Walking in AM.  --Downloaded and uses Insight Timer, guided relaxation app, for nightly practice.  --Continues to utilize diaphragmatic breathing daily  --encouraged early morning sunlight / light therapy for possible SAD in the setting of chronic MDD    (2) Psychiatry - Pt currently established with [facility].  Is followed by Dr. Evette Cristal (attending) but typically has appointments with a psychiatrist resident, Dr. Virl Cagey.    (3) Safety - Pt denies any current or recent SI or safety concerns. Knows to call 911 or go to her local ED. Also previously given [facility].

## 2023-03-15 ENCOUNTER — Ambulatory Visit: Admit: 2023-03-15 | Discharge: 2023-03-16 | Payer: MEDICARE

## 2023-03-15 DIAGNOSIS — R2689 Other abnormalities of gait and mobility: Secondary | ICD-10-CM | POA: Diagnosis not present

## 2023-03-15 MED ADMIN — gadobenate dimeglumine (MULTIHANCE) 529 mg/mL (0.1mmol/0.2mL) solution 18 mL: 18 mL | INTRAVENOUS | @ 12:00:00 | Stop: 2023-03-15

## 2023-03-21 ENCOUNTER — Encounter: Payer: Self-pay | Admitting: Family Medicine

## 2023-03-21 ENCOUNTER — Ambulatory Visit (INDEPENDENT_AMBULATORY_CARE_PROVIDER_SITE_OTHER): Payer: 59 | Admitting: Family Medicine

## 2023-03-21 VITALS — BP 138/82 | HR 85 | Temp 98.2°F | Resp 16 | Ht 68.0 in | Wt 199.7 lb

## 2023-03-21 DIAGNOSIS — R319 Hematuria, unspecified: Secondary | ICD-10-CM

## 2023-03-21 DIAGNOSIS — E162 Hypoglycemia, unspecified: Secondary | ICD-10-CM | POA: Diagnosis not present

## 2023-03-21 DIAGNOSIS — R300 Dysuria: Secondary | ICD-10-CM

## 2023-03-21 DIAGNOSIS — R634 Abnormal weight loss: Secondary | ICD-10-CM | POA: Diagnosis not present

## 2023-03-21 LAB — POCT URINALYSIS DIPSTICK
Bilirubin, UA: NEGATIVE
Glucose, UA: NEGATIVE
Ketones, UA: NEGATIVE
Nitrite, UA: POSITIVE
Protein, UA: POSITIVE — AB
Spec Grav, UA: 1.02 (ref 1.010–1.025)
Urobilinogen, UA: 0.2 E.U./dL
pH, UA: 6.5 (ref 5.0–8.0)

## 2023-03-21 MED ORDER — CEPHALEXIN 500 MG PO CAPS: 500.0000 mg | ORAL_CAPSULE | Freq: Two times a day (BID) | ORAL | 0 refills | Status: AC

## 2023-03-21 NOTE — Patient Instructions (Addendum)
High-Protein and High-Calorie Diet - I would focus on adding snacks with protein and complex carbs - eating every 3 to 4 hours to prevent low sugar episodes   Eating high-protein and high-calorie foods can help you to gain weight, heal after an injury, and recover after an illness or surgery. The specific amount of daily protein and calories you need depends on: Your body weight. The reason this diet is recommended for you. Generally, a high-protein, high-calorie diet involves: Eating 250-500 extra calories each day. Making sure that you get enough of your daily calories from protein. Ask your health care provider how many of your calories should come from protein. Talk with a health care provider or a dietitian about how much protein and how many calories you need each day. Follow the diet as directed by your health care provider. What are tips for following this plan?  Reading food labels Check the nutrition facts label for calories, grams of fat and protein. Items with more than 4 grams of protein are high-protein foods. Preparing meals Add whole milk, half-and-half, or heavy cream to cereal, pudding, soup, or hot cocoa. Add whole milk to instant breakfast drinks. Add peanut butter to oatmeal or smoothies. Add powdered milk to baked goods, smoothies, or milkshakes. Add powdered milk, cream, or butter to mashed potatoes. Add cheese to cooked vegetables. Make whole-milk yogurt parfaits. Top them with granola, fruit, or nuts. Add cottage cheese to fruit. Add avocado, cheese, or both to sandwiches or salads. Add avocado to smoothies. Add meat, poultry, or seafood to rice, pasta, casseroles, salads, and soups. Use mayonnaise when making egg salad, chicken salad, or tuna salad. Use peanut butter as a dip for fruits and vegetables or as a topping for pretzels, celery, or crackers. Add beans to casseroles, dips, and spreads. Add pureed beans to sauces and soups. Replace calorie-free drinks  with calorie-containing drinks, such as milk and fruit juice. Replace water with milk or heavy cream when making foods such as oatmeal, pudding, or cocoa. Add oil or butter to cooked vegetables and grains. Add cream cheese to sandwiches or as a topping on crackers and bread. Make cream-based pastas and soups. General information Ask your health care provider if you should take a nutritional supplement. Try to eat six small meals each day instead of three large meals. A general goal is to eat every 2 to 3 hours. Eat a balanced diet. In each meal, include one food that is high in protein and one food with fat in it. Keep nutritious snacks available, such as nuts, trail mixes, dried fruit, and yogurt. If you have kidney disease or diabetes, talk with your health care provider about how much protein is safe for you. Too much protein may put extra stress on your kidneys. Drink your calories. Choose high-calorie drinks and have them after your meals. Consider setting a timer to remind you to eat. You will want to eat even if you do not feel very hungry. What high-protein foods should I eat?  Vegetables Soybeans. Peas. Grains Quinoa. Bulgur wheat. Buckwheat. Meats and other proteins Beef, pork, and poultry. Fish and seafood. Eggs. Tofu. Textured vegetable protein (TVP). Peanut butter. Nuts and seeds. Dried beans. Protein powders. Hummus. Dairy Whole milk. Whole-milk yogurt. Powdered milk. Cheese. [employer]. Eggnog. Beverages High-protein supplement drinks. Soy milk. Other foods Protein bars. The items listed above may not be a complete list of foods and beverages you can eat and drink. Contact a dietitian for more information. What high-calorie  foods should I eat? Fruits Dried fruit. Fruit leather. Canned fruit in syrup. Fruit juice. Avocado. Vegetables Vegetables cooked in oil or butter. Fried potatoes. Grains Pasta. Quick breads. Muffins. Pancakes. Ready-to-eat cereal. Meats and  other proteins Peanut butter. Nuts and seeds. Dairy Heavy cream. Whipped cream. Cream cheese. Sour cream. Ice cream. Custard. Pudding. Whole milk dairy products. Beverages Meal-replacement beverages. Nutrition shakes. Fruit juice. Seasonings and condiments Salad dressing. Mayonnaise. Alfredo sauce. Fruit preserves or jelly. Honey. Syrup. Sweets and desserts Cake. Cookies. Pie. Pastries. Candy bars. Chocolate. Fats and oils Butter or margarine. Oil. Gravy. Other foods Meal-replacement bars. The items listed above may not be a complete list of foods and beverages you can eat and drink. Contact a dietitian for more information. Summary A high-protein, high-calorie diet can help you gain weight or heal faster after an injury, illness, or surgery. To increase your protein and calories, add ingredients such as whole milk, peanut butter, cheese, beans, meat, or seafood to meal items. To get enough extra calories each day, include high-calorie foods and beverages at each meal. Adding a high-calorie drink or shake can be an easy way to help you get enough calories each day. Talk with your healthcare provider or dietitian about the best options for you. This information is not intended to replace advice given to you by your health care provider. Make sure you discuss any questions you have with your health care provider. Document Revised: 07/24/2020 Document Reviewed: 07/26/2020 Elsevier Patient Education  2024 [employer]  Hypoglycemia Hypoglycemia is when the sugar (glucose) level in your blood is too low. Low blood sugar can happen to people who have diabetes and people who do not have diabetes. Low blood sugar can happen quickly, and it can be an emergency. What are the causes? This condition happens most often in people who have diabetes. It may be caused by: Diabetes medicine. Not eating enough, or not eating often enough. Doing more physical activity. Drinking alcohol on an empty  stomach. If you do not have diabetes, this condition may be caused by: A tumor in the pancreas. Not eating enough, or not eating for long periods at a time (fasting). A very bad infection or illness. Problems after having weight loss (bariatric) surgery. Kidney failure or liver failure. Certain medicines. What increases the risk? This condition is more likely to develop in people who: Have diabetes and take medicines to lower their blood sugar. Abuse alcohol. Have a very bad illness. What are the signs or symptoms? Mild Hunger. Sweating and feeling clammy. Feeling dizzy or light-headed. Being sleepy or having trouble sleeping. Feeling like you may vomit (nauseous). A fast heartbeat. A headache. Blurry vision. Mood changes, such as: Being grouchy. Feeling worried or nervous (anxious). Tingling or loss of feeling (numbness) around your mouth, lips, or tongue. Moderate Confusion and poor judgment. Behavior changes. Weakness. Uneven heartbeat. Trouble with moving (coordination). Very low Very low blood sugar (severe hypoglycemia) is a medical emergency. It can cause: Fainting. Seizures. Loss of consciousness (coma). Death. How is this treated? Treating low blood sugar Low blood sugar is often treated by eating or drinking something that has sugar in it right away. The food or drink should contain 15 grams of a fast-acting carb (carbohydrate). Options include: 4 oz (120 mL) of fruit juice. 4 oz (120 mL) of regular soda (not diet soda). A few pieces of hard candy. Check food labels to see how many pieces to eat for 15 grams. 1 Tbsp (15 mL) of sugar  or honey. 4 glucose tablets. 1 tube of glucose gel. Treating low blood sugar if you have diabetes If you can think clearly and swallow safely, follow the 15:15 rule: Take 15 grams of a fast-acting carb. Talk with your doctor about how much you should take. Always keep a source of fast-acting carb with you, such as: Glucose  tablets (take 4 tablets). A few pieces of hard candy. Check food labels to see how many pieces to eat for 15 grams. 4 oz (120 mL) of fruit juice. 4 oz (120 mL) of regular soda (not diet soda). 1 Tbsp (15 mL) of honey or sugar. 1 tube of glucose gel. Check your blood sugar 15 minutes after you take the carb. If your blood sugar is still at or below 70 mg/dL (3.9 mmol/L), take 15 grams of a carb again. If your blood sugar does not go above 70 mg/dL (3.9 mmol/L) after 3 tries, get help right away. After your blood sugar goes back to normal, eat a meal or a snack within 1 hour.  Treating very low blood sugar If your blood sugar is below 54 mg/dL (3 mmol/L), you have very low blood sugar, or severe hypoglycemia. This is an emergency. Get medical help right away. If you have very low blood sugar and you cannot eat or drink, you will need to be given a hormone called glucagon. A family member or friend should learn how to check your blood sugar and how to give you glucagon. Ask your doctor if you need to have an emergency glucagon kit at home. Very low blood sugar may also need to be treated in a hospital. Follow these instructions at home: General instructions Take over-the-counter and prescription medicines only as told by your doctor. Stay aware of your blood sugar as told by your doctor. If you drink alcohol: Limit how much you have to: 0-1 drink a day for women who are not pregnant. 0-2 drinks a day for men. Know how much alcohol is in your drink. In the [place] one drink equals one 12 oz bottle of beer (355 mL), one 5 oz glass of wine (148 mL), or one 1 oz glass of hard liquor (44 mL). Be sure to eat food when you drink alcohol. Know that your body absorbs alcohol quickly. This may lead to low blood sugar later. Be sure to keep checking your blood sugar. Keep all follow-up visits. If you have diabetes:  Always have a fast-acting carb (15 grams) with you to treat low blood sugar. Follow  your diabetes care plan as told by your doctor. Make sure you: Know the symptoms of low blood sugar. Check your blood sugar as often as told. Always check it before and after exercise. Always check your blood sugar before you drive. Take your medicines as told. Follow your meal plan. Eat on time. Do not skip meals. Share your diabetes care plan with: Your work or school. People you live with. Carry a card or wear jewelry that says you have diabetes. Where to find more information American Diabetes Association: www.diabetes.org Contact a doctor if: You have trouble keeping your blood sugar in your target range. You have low blood sugar often. Get help right away if: You still have symptoms after you eat or drink something that contains 15 grams of fast-acting carb, and you cannot get your blood sugar above 70 mg/dL by following the 13:08 rule. Your blood sugar is below 54 mg/dL (3 mmol/L). You have a seizure. You faint.  These symptoms may be an emergency. Get help right away. Call your local emergency services (911 in the [place] Do not wait to see if the symptoms will go away. Do not drive yourself to the hospital. Summary Hypoglycemia happens when the level of sugar (glucose) in your blood is too low. Low blood sugar can happen to people who have diabetes and people who do not have diabetes. Low blood sugar can happen quickly, and it can be an emergency. Make sure you know the symptoms of low blood sugar and know how to treat it. Always keep a source of sugar (fast-acting carb) with you to treat low blood sugar. This information is not intended to replace advice given to you by your health care provider. Make sure you discuss any questions you have with your health care provider. Document Revised: 07/23/2020 Document Reviewed: 07/23/2020 Elsevier Patient Education  2024 [employer].

## 2023-03-21 NOTE — Progress Notes (Signed)
Patient ID: Nicole Ballard, female    DOB: [birth year], 67 y.o.   MRN: [id number]  PCP: Alba Cory, MD  Chief Complaint  Patient presents with   Hematuria    Since sunday   Urinary Frequency   Dysuria    Pt states even her hands have burning sensation    Subjective:   Nicole Ballard is a 67 y.o. female, presents to clinic with CC of the following:  HPI  Pt has urinary freq and urgency and then developed urgency with incontinence that started 2-3 days ago She then started to noted blood in urine and last night new nocturia  She has some lower abdominal pain that is intermittent  She has some chronic back pain that she is not sure if it is her lupus/fibromyalgia or related to new sx UA here reviewed with pt, she has not tried anything OTC for her sx, no hx of recurrent UTI Results for orders placed or performed in visit on 03/21/23  POCT urinalysis dipstick  Result Value Ref Range   Color, UA Yellow    Clarity, UA Cloudy    Glucose, UA Negative Negative   Bilirubin, UA Negative    Ketones, UA Negative    Spec Grav, UA 1.020 1.010 - 1.025   Blood, UA Large    pH, UA 6.5 5.0 - 8.0   Protein, UA Positive (A) Negative   Urobilinogen, UA 0.2 0.2 or 1.0 E.U./dL   Nitrite, UA Positive    Leukocytes, UA Large (3+) (A) Negative   Appearance Yellow    Odor Foul     She is also concerned about gradual unintentional weight loss which she has been seeing her PCP for, she lost 5 more lbs, has been trying to eat more  Wt Readings from Last 10 Encounters:  03/21/23 199 lb 11.2 oz (90.6 kg)  03/01/23 204 lb (92.5 kg)  02/08/23 207 lb 3.2 oz (94 kg)  01/13/23 211 lb 9.6 oz (96 kg)  01/10/23 205 lb 14.4 oz (93.4 kg)  10/24/22 219 lb 9.6 oz (99.6 kg)  10/12/22 219 lb 12.8 oz (99.7 kg)  08/11/22 231 lb 1.6 oz (104.8 kg)  07/12/22 233 lb 9.6 oz (106 kg)  06/06/22 245 lb (111.1 kg)   BMI Readings from Last 5 Encounters:  03/21/23 30.36 kg/m  03/01/23 31.02 kg/m   02/08/23 32.45 kg/m  01/13/23 33.14 kg/m  01/10/23 32.25 kg/m       Patient Active Problem List   Diagnosis Date Noted   Genetic testing 03/14/2023   Abnormal SPEP 01/13/2023   Weight loss 01/13/2023   Family history of breast cancer 01/13/2023   Asthma, chronic, severe persistent, uncomplicated 10/12/2022   Other vascular myelopathies (HCC) 10/12/2022   Interstitial lung disease (HCC) 07/12/2022   Empty sella (HCC) 07/12/2022   History of craniotomy 07/12/2022   Immunocompromised (HCC) 04/29/2021   Hyperglycemia 10/23/2017   Vertigo 09/29/2016   Bronchiolitis obliterans organizing pneumonia (HCC) 09/29/2016   Neck pain, musculoskeletal 12/15/2015   Cough in adult 09/11/2015   Abnormal CAT scan 04/11/2015   Auditory impairment 04/11/2015   Insomnia, persistent 04/11/2015   Chronic kidney disease (CKD), stage I 04/11/2015   Chronic nonmalignant pain 04/11/2015   Chronic constipation 04/11/2015   Dyslipidemia 04/11/2015   Fibromyalgia 04/11/2015   Gastro-esophageal reflux disease without esophagitis 04/11/2015   Dysphonia 04/11/2015   Low back pain 04/11/2015   Eczema intertrigo 04/11/2015   Keratosis pilaris 04/11/2015   Chronic recurrent major  depressive disorder (HCC) 04/11/2015   Dysmetabolic syndrome 04/11/2015   Neurogenic claudication 04/11/2015   Perennial allergic rhinitis with seasonal variation 04/11/2015   Abnormal presence of protein in urine 04/11/2015   Seborrhea capitis 04/11/2015   Moderate dysplasia of vulva 04/11/2015   Vitamin D deficiency 04/11/2015   Spinal stenosis of lumbar region 04/11/2015   Treadmill stress test negative for angina pectoris 03/05/2015   Shortness of breath 05/20/2014   Morbid obesity (HCC) 02/03/2014   Asthma, chronic 12/31/2013   H/O total knee replacement 12/31/2013   Episcleritis 09/26/2013   Vocal cord dysfunction 05/28/2013   Anxiety, generalized 03/19/2013   Back pain 12/18/2012   Pulmonary nodules 11/26/2012    Lung nodule, multiple 11/26/2012   Arthritis, degenerative 11/01/2012   H/O aspiration pneumonitis 10/22/2012   Postinflammatory pulmonary fibrosis (HCC) 10/22/2012   Mixed incontinence 05/04/2012   Lung involvement in systemic lupus erythematosus (HCC) 11/05/2010   Chronic nausea 07/01/2008   Benign hypertension 01/25/2005      Current Outpatient Medications:    albuterol (VENTOLIN HFA) 108 (90 Base) MCG/ACT inhaler, SMARTSIG:2 Puff(s) By Mouth Every 6 Hours PRN, Disp: , Rfl:    amLODipine (NORVASC) 2.5 MG tablet, Take 3 tablets (7.5 mg total) by mouth daily., Disp: 270 tablet, Rfl: 3   aspirin EC 81 MG tablet, Take 1 tablet (81 mg total) by mouth daily., Disp: 30 tablet, Rfl: 0   atenolol (TENORMIN) 25 MG tablet, Take 1 tablet by mouth in the morning and at bedtime., Disp: , Rfl:    atorvastatin (LIPITOR) 40 MG tablet, Take 1 tablet (40 mg total) by mouth daily., Disp: 90 tablet, Rfl: 3   Biotin 1 MG CAPS, Take 1 capsule by mouth daily., Disp: 30 capsule, Rfl: 0   buPROPion (WELLBUTRIN XL) 300 MG 24 hr tablet, Take 300 mg by mouth in the morning., Disp: , Rfl:    Cholecalciferol (VITAMIN D) 2000 UNITS CAPS, Take 1 capsule by mouth daily., Disp: , Rfl:    clobetasol ointment (TEMOVATE) 0.05 %, Apply 1 application topically 2 (two) times daily., Disp: , Rfl:    clonazePAM (KLONOPIN) 0.5 MG tablet, Take 0.25 mg by mouth at bedtime., Disp: , Rfl:    clotrimazole-betamethasone (LOTRISONE) cream, Apply 1 application topically 2 (two) times daily., Disp: 45 g, Rfl: 0   cyclobenzaprine (FLEXERIL) 5 MG tablet, Take by mouth., Disp: , Rfl:    diclofenac Sodium (VOLTAREN) 1 % GEL, Apply topically daily as needed., Disp: , Rfl:    fluticasone (FLONASE) 50 MCG/ACT nasal spray, Place 2 sprays into both nostrils daily., Disp: 48 g, Rfl: 3   hydroxychloroquine (PLAQUENIL) 200 MG tablet, Take by mouth 2 (two) times daily., Disp: , Rfl:    hydrOXYzine (ATARAX) 10 MG tablet, Take 1 tablet (10 mg total)  by mouth 3 (three) times daily as needed., Disp: 30 tablet, Rfl: 0   lamoTRIgine (LAMICTAL) 200 MG tablet, Take 400 mg by mouth at bedtime., Disp: , Rfl:    LINZESS 145 MCG CAPS capsule, Take 145 mcg by mouth daily., Disp: , Rfl:    loratadine (CLARITIN) 10 MG tablet, Take 1 tablet (10 mg total) by mouth daily., Disp: 90 tablet, Rfl: 1   Melatonin 10 MG TABS, Take 1 tablet by mouth at bedtime., Disp: , Rfl:    metFORMIN (GLUCOPHAGE-XR) 500 MG 24 hr tablet, TAKE 1 TABLET BY MOUTH DAILY  WITH BREAKFAST, Disp: 100 tablet, Rfl: 2   metoprolol succinate (TOPROL-XL) 25 MG 24 hr tablet, Take 1 tablet (  25 mg total) by mouth daily. In place of Atenolol, Disp: 30 tablet, Rfl: 0   mycophenolate (MYFORTIC) 360 MG TBEC EC tablet, Take 720 mg by mouth 2 (two) times daily., Disp: , Rfl:    olopatadine (PATANOL) 0.1 % ophthalmic solution, Place 1 drop into both eyes 2 (two) times daily., Disp: 5 mL, Rfl: 1   omeprazole (PRILOSEC) 40 MG capsule, Take 1 capsule by mouth in the morning and at bedtime. Pulmonologist, Disp: , Rfl:    polyethylene glycol powder (GLYCOLAX/MIRALAX) 17 GM/SCOOP powder, Take 17 g by mouth 2 (two) times daily as needed., Disp: 3350 g, Rfl: 1   predniSONE (DELTASONE) 2.5 MG tablet, Take 2 tablets by mouth daily at 12 noon., Disp: , Rfl:    pregabalin (LYRICA) 50 MG capsule, Take 50 mg by mouth 3 (three) times daily., Disp: , Rfl:    telmisartan (MICARDIS) 80 MG tablet, Take 1 tablet (80 mg total) by mouth daily., Disp: 90 tablet, Rfl: 3   traZODone (DESYREL) 100 MG tablet, Take 150 mg by mouth at bedtime., Disp: , Rfl:    triamcinolone ointment (KENALOG) 0.1 %, , Disp: , Rfl:    famotidine (PEPCID) 20 MG tablet, Take 20 mg by mouth daily., Disp: , Rfl:    Allergies  Allergen Reactions   Ace Inhibitors     Other reaction(s): OTHER Pt states she can not take ace inhibitors. Pt states she can not remember her reaction.    Bee Pollen    Penicillins    Pollen Extract      Social  History   Tobacco Use   Smoking status: Never   Smokeless tobacco: Never  Vaping Use   Vaping status: Never Used  Substance Use Topics   Alcohol use: No    Alcohol/week: 0.0 standard drinks of alcohol   Drug use: No      Chart Review Today: I personally reviewed active problem list, medication list, allergies, family history, social history, health maintenance, notes from last encounter, lab results, imaging with the patient/caregiver today.   Review of Systems  Constitutional: Negative.  Negative for activity change, appetite change, chills, diaphoresis, fatigue and fever.  HENT: Negative.    Eyes: Negative.   Respiratory: Negative.  Negative for chest tightness, shortness of breath and wheezing.   Cardiovascular: Negative.  Negative for chest pain.  Gastrointestinal:  Positive for abdominal pain and constipation. Negative for abdominal distention, diarrhea, nausea, rectal pain and vomiting.  Endocrine: Negative.   Genitourinary: Negative.   Musculoskeletal: Negative.   Skin: Negative.   Allergic/Immunologic: Negative.   Neurological: Negative.   Hematological: Negative.   Psychiatric/Behavioral: Negative.    All other systems reviewed and are negative.      Objective:   Vitals:   03/21/23 1010  BP: 138/82  Pulse: 85  Resp: 16  Temp: 98.2 F (36.8 C)  TempSrc: Oral  SpO2: 97%  Weight: 199 lb 11.2 oz (90.6 kg)  Height: 5\' 8"  (1.727 m)    Body mass index is 30.36 kg/m.  Physical Exam Vitals and nursing note reviewed.  Constitutional:      General: She is not in acute distress.    Appearance: Normal appearance. She is well-developed. She is obese. She is not ill-appearing, toxic-appearing or diaphoretic.  HENT:     Head: Normocephalic and atraumatic.     Nose: Nose normal.  Eyes:     General:        Right eye: No discharge.  Left eye: No discharge.     Conjunctiva/sclera: Conjunctivae normal.  Neck:     Trachea: No tracheal deviation.   Cardiovascular:     Rate and Rhythm: Normal rate and regular rhythm.     Pulses: Normal pulses.     Heart sounds: Normal heart sounds. No murmur heard.    No friction rub. No gallop.  Pulmonary:     Effort: Pulmonary effort is normal. No respiratory distress.     Breath sounds: Normal breath sounds. No stridor.  Abdominal:     General: Abdomen is protuberant. Bowel sounds are normal. There is no distension.     Palpations: Abdomen is soft.     Tenderness: There is no abdominal tenderness. There is no right CVA tenderness, left CVA tenderness, guarding or rebound.  Skin:    General: Skin is warm and dry.     Findings: No rash.  Neurological:     Mental Status: She is alert.     Motor: No abnormal muscle tone.     Coordination: Coordination normal.  Psychiatric:        Mood and Affect: Mood normal.        Behavior: Behavior normal.      Results for orders placed or performed in visit on 03/21/23  POCT urinalysis dipstick  Result Value Ref Range   Color, UA Yellow    Clarity, UA Cloudy    Glucose, UA Negative Negative   Bilirubin, UA Negative    Ketones, UA Negative    Spec Grav, UA 1.020 1.010 - 1.025   Blood, UA Large    pH, UA 6.5 5.0 - 8.0   Protein, UA Positive (A) Negative   Urobilinogen, UA 0.2 0.2 or 1.0 E.U./dL   Nitrite, UA Positive    Leukocytes, UA Large (3+) (A) Negative   Appearance Yellow    Odor Foul        Assessment & Plan:    1. Hematuria, unspecified type Onset of urinary sx 2 d ago, worsening, no hx of recurrent UTI, will send urine for culture, sx and dip are consistent with simple UTI, will tx pt empirically with keflex and will follow culture Pts abd exam is benign, she is well appearing, VSS - POCT urinalysis dipstick - Urine Culture  2. Dysuria See #1 - POCT urinalysis dipstick - Urine Culture  3. Unintentional weight loss She has discussed this at length with her PCP and they have worked it up I reassured her that right now while  having a UTI its not uncommon to have a little decreased appetite and I would expect that to improve when she is done with abx I encouraged her to eat more frequent small meals/snacks with protein, or add 1-2 boost/ensure a day on top of her other diet efforts to maintain weight, at this time with her BMI and body habitus and in reviewing her weights I am not concerned for protein malnutrition, but if she lost another 10 lbs unintentionally in the next few months I would recommend f/up with PCP sooner than her next routine appt  4. Hypoglycemia One incidental non-symptomatic low blood sugar on labs earlier this year - again encouraged smaller more frequent meals/snacks high in protein and eating complex carbs to help avoid low blood sugar episodes - we reviewed the sx of hypoglycemia (which she does not currently seem to be experiencing) and I encouraged her to also f/up with [place] if she has sx and we could set her up with  glucometer and RD  follow up as needed if not improving (UTI improving) in the next 3-5 days   Danelle Berry, PA-C 03/21/23 10:28 AM

## 2023-03-23 DIAGNOSIS — K5904 Chronic idiopathic constipation: Secondary | ICD-10-CM | POA: Diagnosis not present

## 2023-03-23 DIAGNOSIS — R52 Pain, unspecified: Secondary | ICD-10-CM | POA: Diagnosis not present

## 2023-03-23 DIAGNOSIS — M6289 Other specified disorders of muscle: Secondary | ICD-10-CM | POA: Diagnosis not present

## 2023-03-23 LAB — URINE CULTURE
MICRO NUMBER:: 15207886
SPECIMEN QUALITY:: ADEQUATE

## 2023-03-23 NOTE — Unmapped (Signed)
03/23/23   PELVIC FLOOR - [place]       ENDURANCE   Perform in a seated or lying down position   Gently squeeze for 1 seconds   Relax for 5 seconds   Repeat 5 times, 4 sets, 2 x per day

## 2023-03-23 NOTE — Unmapped (Signed)
[facility] THERAPIES PT [address] [place]  OUTPATIENT PHYSICAL THERAPY  03/23/2023          Patient Name: Caitlin Smith  Date of Birth:05/10/1956  Diagnosis:   Encounter Diagnoses   Name Primary?    Pelvic floor dysfunction in female Yes    Chronic idiopathic constipation      Referring MD:  Corinda Gubler, *     Plan of Care Effective Date: 02/28/2023 - 05/29/2023  03/23/23        Assessment  Assessment details:    67 year old female presents in the clinic with worsening chronic constipation with pelvic floor dysfunction.  Patient with elevated resting tone of the pelvic floor muscles with decreased ability to relax her pelvic floor muscles appropriately. Patient will likely benefit from skilled physical therapy to improve her ability to have a bowel movement at least every other day without straining through manual therapy, therapeutic exercise, patient education such as learning toilet posture and behavioral techniques to improve her pelvic floor function as well as a home exercise program.          Impairments: bowel dysfunction, pain and urinary incontinence                Prognosis: good prognosis    Positive Prognosis Rationale: motivated for treatment.  Negative Prognosis Rationale: chronicity of condition and severity of symptoms.    Barriers to therapy: transportation    Therapy Goals      Goals:      Short Term Goal - 4 weeks  1. Patient independent with HEP to learn improved toilet postures for effectively emptying her bowels.      Long Term Goals - 16 weeks  1.  Patient able to have a bowel movement every other day without straining.  2.  Patient able to sneeze without leaking urine.  3.  Patient able to feel that she is completely emptying her bowels.    Plan    Therapy options: will be seen for skilled physical therapy services    Planned therapy interventions: 97110-Therapeutic Exercises, 97112-Neuromuscular Re-education, 97140-Manual Therapy, 97530-Therapeutic Activities and 97535-Self-Care/Home Training      Frequency: 1x week    Duration in weeks: 16    Education provided to: patient.    Education provided: Teacher, music, HEP, Toileting posture and Self-soft tissue mobilization    Education results: needs reinforcement.      Next visit plan:        Biofeedback?    Total Session Time: 45    Treatment rendered today:      Therapeutic Exercise 10'  [place] Exercises  Endurance 1 second hold, 5 second rest, 5 reps, 4 sets, 2x/day with emphasis on relaxation.  Handout given.    Manual Therapy 20'  STM for I, L, U massage with flat hand and circles as well as medium cup with medium suction with use of deep tissue massage lotion.        Self Care 10'  Discussed use of cups and or continued STM along the line of the colon at home.  Patient educated to use a lotion or coconut oil with cups if she chooses to use them.  Reviewed the move it recipe.  Patient stated she would go to the store to find the ingredients as she had trouble finding the oat or wheat bran previously.    5 minutes of appointment was wallking in and out of treatment room and changing into and out of  a gown and confirming next appointment.             History of Present Condition     History of Present Condition/Chief Complaint:  Chronic constipation, pelvic floor dysfunction  Subjective:  Patient stated she initially had a lot of luck with the self colon massage and had one day where she had three bowel movements.  This week she has not been able to do at all.  She has been taking the Linzess but stopped the Miralax because it was making her feel nauseous.      Quality of life:  Fair  Pain:     Current pain rating:  7    At best pain rating:  6    At worst pain rating:  10  Pain Comments: Chronic pain - had a knee replacement on the right.  Did not heal well.  Patient stated she has a lot of pain in the left knee.  Patient chose not to replace the left knee at this time.  Patient with fibromyalgia and has lung disease so has to walk.      Quality:  Aching and sore    Physical limitation(s):  Knee pain  Current Functional Status:  Limited walking tolerance  Social Support:     Lives Environment:  Apartment and stairs (one flight)    Lives with:  Alone    Work/School:  Walks in the morning for 45-60 minutes to improve her lung disease  Treatments:     Current treatment: medication    Patient Goals:     Patient/Family goals for therapy:  Decrease/Eliminate UI and improve bowel function      OB/GYN History:  History of Pregnancy?: Yes  Pregnancy Type: Gravida, Para, Vaginal Deliveries  # of Gravida: 2   # of Para : 2  # of Vaginal Deliveries: 2        Sexual History:  Sexual Dysfunction: No (patient hasn't been sexually active, but woudl like to be,  She stated she did not have any sensation.)            Urinary Symptoms:  Fluid Intake: water - 40 oz, tea,  # of voids : 3, 4, per day       # of voids per night: per night, 2  Urinary urgency?: Yes  Urinary incontinence?: Yes       Urinary Incontinence types : SUI (sneeze or cough)                     Bowel Symptoms:  # of BMs: 0, 1, 2, per week (two capfuls a day of miralax but was making her sick.  Takes Linzess daily.  If takes a pill, it takes two to three days to work.)      Stool Type (based on Bristol Stool Chart): V      Straining?: Yes  Fecal urgency?: No      Fecal incontinence?: No                  Postural Observations  Seated posture: fair  Standing posture: fair      Ambulation   Observational Gait   Gait: antalgic   Decreased walking speed and stride length.     Pelvic Floor  Verbal consent given from the patient before start of pelvic floor exam including single digit penetration into the vaginal opening.    Observation  Skin WNL  Visible Contraction minimal  Visible Relaxation minimal, delayed  Palpation  Patient with mild increased resting tone grossly of the bilateral levator ani without complaints of tenderness to palpation.    Pelvic Floor Strength - Laycock Scale  Power - 2  Endurance 3  Repetitions 5  Fast Repetitions not tested due to delayed relaxation following contraction of the pelvic floor.  Patient with initial contraction of the pelvic floor following cue to relax pelvic floor.  Patient able to slowly relax after several seconds.       I attest that I have reviewed the above information.  Signed: Gaylyn Lambert, PT  03/23/2023 8:18 AM      All information regarding the expectations of participation in pelvic floor physical therapy, including but not limited to, the need for internal and/or external pelvic floor assessment, treatment techniques and plan of care were thoroughly discussed.  Patient confirms understanding that (s)he may bring a third party to any or all physical therapy sessions and can withdraw consent at any point throughout the plan of care.  Patient and guardian (if applicable) verbalize understanding of all above information and consent to pelvic floor assessment and treatment at evaluation and all future physical therapy sessions.

## 2023-03-27 DIAGNOSIS — J8489 Other specified interstitial pulmonary diseases: Principal | ICD-10-CM

## 2023-04-06 ENCOUNTER — Encounter: Payer: Self-pay | Admitting: Family Medicine

## 2023-04-06 ENCOUNTER — Ambulatory Visit: Admit: 2023-04-06 | Discharge: 2023-04-28 | Payer: MEDICARE

## 2023-04-06 ENCOUNTER — Ambulatory Visit: Admit: 2023-04-06 | Payer: PRIVATE HEALTH INSURANCE

## 2023-04-06 ENCOUNTER — Ambulatory Visit
Admit: 2023-04-06 | Discharge: 2023-04-28 | Payer: MEDICARE | Attending: Rehabilitative and Restorative Service Providers" | Primary: Rehabilitative and Restorative Service Providers"

## 2023-04-06 DIAGNOSIS — K5904 Chronic idiopathic constipation: Secondary | ICD-10-CM | POA: Diagnosis not present

## 2023-04-06 DIAGNOSIS — R52 Pain, unspecified: Secondary | ICD-10-CM | POA: Diagnosis not present

## 2023-04-06 DIAGNOSIS — M6289 Other specified disorders of muscle: Secondary | ICD-10-CM | POA: Diagnosis not present

## 2023-04-06 MED ORDER — PREGABALIN 50 MG CAPSULE
ORAL_CAPSULE | Freq: Three times a day (TID) | ORAL | 1 refills | 30 days
Start: 2023-04-06 — End: 2024-04-05

## 2023-04-06 MED ORDER — LIDOCAINE 5 % TOPICAL OINTMENT
Freq: Two times a day (BID) | TOPICAL | 2 refills | 0 days
Start: 2023-04-06 — End: 2024-04-05

## 2023-04-06 NOTE — Unmapped (Unsigned)
Chronic Pain Follow Up Note    Assessment and Plan    Caitlin Smith is a 67 y.o.  being followed at [facility] for complaint of chronic pain localized to right knee and bilateral neck, shoulders and upper back myofascial pain and fibromyalgia, and lower back 2/2 lumbar facet arthropathy and DDD and moderate degree of lumbar central spinal canal stenosis. Patient has a history of chronic pain related to lupus and arthritis and is on rheumatological therapy.     Bilateral shoulder pain   ***  Likely multifactorial, myofascial pain in setting of fibromyalgia and OA (radiographs with moderate AC OA). Denies weakness, tingling, radiculopathic pain.  - Continue Voltaren gel TID  - if pain worsening, referral to ortho for possible shoulder injections    Polyneuropathy   ***   Patient endorses numbness in bilateral feet. Transitioned from gabapentin to Lyrica that she was able to escalate to 50 mg tid without any side effects and analgesic benefit  - Continue Lyrica 50mg  TID   - Continue topical lidocaine 5% BID  - Encouraged to look online for TENS unit sock to try out    Chronic pain syndrome; Fibromyalgia   ***  Patient notes that Lyrica and Flexeril are helping with pain.   She has been doing PT for the past 3 months, which transiently exacerbates her pain but she is motivated to continue.  - Continue Flexeril 5 mg once daily PRN, refilled  - Continue vanlafaxine per PCP  - Continue Trazodone 150 mg qhs per PCP  - Continue voltaren Gel  - Continue pain psychology.  - Continue PT and activity    Chronic lower back pain; Facet Arthropathy, likely SIJ Dysfunction   ***  Secondary to SIJ and lumbar facet arthropathy and DDD and moderate degree of lumbar central spinal canal stenosis. Patient also endorsing weakness and worsening pain after walking a couple steps which is concerning for neurogenic claudication from known central canal stenosis which may be progressively worsening. Previously had good relief with L4/5 and L5/S1 facet injections in the past and may benefit from repeat injections. Notably, patient continues to struggle with pulmonary fibrosis and wishes to defer any elective interventional procedures at this time.    -Continue Voltaren gel as above  -Consider repeat bilateral L4/L5 and L5/S1 facet injections and/or SIJ injections in the future once pulmonary status stabilized    Requested Prescriptions      No prescriptions requested or ordered in this encounter     No orders of the defined types were placed in this encounter.    Risks and benefits of above medications including but not limited to possibility of respiratory depression, sedation, and even death were discussed with the patient who expressed an understanding.     Urine toxicology screen is not due today.  Treatment agreement renewal is not due today.     HPI  Caitlin Smith is a 67 y.o. being followed at [facility] for complaint of chronic pain localized to right knee and bilateral neck, shoulder, upper back, and lower back 2/2 lumbar facet arthropathy and DDD. Patient has a history of chronic pain related to lupus, arthritis, and fibromyalgia.     The patient was last seen on 08/19/22, at which point the patient presented with likely multifactorial, myofascial pain in setting of fibromyalgia and OA (radiographs with moderate AC OA). Denied weakness, tingling, radiculopathic pain.  Patient endorsed numbness in bilateral feet. Transitioned from gabapentin to Lyrica that  she was able to escalate to 50 mg tid without any side effects and analgesic benefit. Patient noted that Lyrica and Flexeril were helping with pain. She had been doing PT for the past 3 months, which transiently exacerbated her pain but she was motivated to continue. Secondary to SIJ and lumbar facet arthropathy and DDD and moderate degree of lumbar central spinal canal stenosis. Patient also endorsed weakness and worsening pain after walking a couple steps which was concerning for neurogenic claudication from known central canal stenosis which may have been progressively worsening. Previously had good relief with L4/5 and L5/S1 facet injections in the past and may benefit from repeat injections. Notably, patient continued to struggle with pulmonary fibrosis and wished to defer any elective interventional procedures at this time.    Since last visit, the patient has followed with Internal med, Neurology, Surg Onc, Pain Psych, Pulmonology, Psychiatry, Oncology, OBGYN, Nephrology and PT.     Today, ***    The patient's medication regimen:  Gabapentin 300 mg TID  Cymbalta 120mg  am (PCP)  Trazodone 150 mg qhs (PCP)  Flexeril 5 mg BID prn  Voltaren gel prn    The patient states her pain is located *** and the severity of her pain ranges from ***/10 to ***/10.  Her pain currently is ***/10 and on average is ***/10.  She describes the sensation of her pain as {pain described:58928}. Her pain is present {pain frequency:58931} and worst {pain worst:59685}. The patient???s pain impacts {negatively affected:59709}. Her interval history includes {interval history:59710}. Her pain {pain changed:59711}, and she {does does not blank:47747} have new pain to discuss today. She {is/is not:36772} on blood thinners or anti-coagulants. In regards to medications currently taken for pain management, the patient {is:54246::is} tolerating these medications well and complains of associated side effects: {pain med side effects:59712}.    Patient denies homicidal/suicidal ideation.    Medication Monitoring  [place] database was reviewed and it was appropriate.  Last urine toxicology screen: N/A, not on opioids  The patient did not bring pill bottles today, not on opioids    Imaging   MRI lumbar spine 10/28/20  FINDINGS:  There is mild lumbar dextrorotatory scoliosis centered at the L2-L3 level. Trace retrolisthesis of L5 on S1. The remaining lumbar vertebral bodies are normally aligned. Bone marrow signal intensity is normal. The visualized cord is unremarkable and the conus medullaris ends at a normal level.     A Schmorl's node is noted within the inferior endplate of T11. Heights of the remaining vertebral bodies are preserved. There is multilevel disc desiccation. Mild loss of intervertebral disc height at L5-S1.      T12-L1 and L1-L2: No significant canal or neural foraminal stenosis. Mild facet degeneration and ligamentum flavum thickening.     L2-L3: Circumferential disc bulge with mild facet hypertrophy and ligamentum flavum thickening. No significant central canal stenosis. Mild right greater than left neural foraminal narrowing.      L3-L4: Circumferential disc bulge with mild facet arthropathy and moderate ligamentum flavum thickening. Mild central canal stenosis. Mild bilateral neural foraminal narrowing.     L4-L5: Circumferential disc bulge with a tiny, superimposed shallow central disc protrusion. Moderate facet hypertrophy and ligamentum flavum thickening. Mild central spinal canal narrowing. Mild left than right neural foraminal narrowing.     L5-S1: Mild circumferential disc bulge. Mild bilateral facet hypertrophy and ligamentum flavum thickening. No central canal stenosis. Mild bilateral neural foraminal narrowing.      The paraspinal tissues are within normal limits.  For the purposes of this dictation, the lowest well formed intervertebral disc space is assumed to be the L5-S1 level, and there are presumed to be five lumbar-type vertebral bodies.    IMPRESSION:  Multilevel degenerative lumbar spondylosis resulting in at most mild canal or central canal stenosis. Overall, findings are not significantly changed when compared to prior MRI from 2017.    Procedures  Bilateral Intra-articular Lumbar Facet Injections under Fluoroscopic Guidance at L4/5 and L5/S1with steroid 12/29/20      Allergies  Allergies   Allergen Reactions    Vilazodone Swelling    Ace Inhibitors Other (See Comments)     Pt states she can not take ace inhibitors. Pt states she can not remember her reaction.     Bee Pollen Itching     COUGH, WATERY EYES    Penicillin G Rash    Penicillins Rash    Pollen Extracts Itching     COUGHING, WATERY EYES       Home Medications    Current Outpatient Medications   Medication Sig Dispense Refill    albuterol HFA 90 mcg/actuation inhaler Inhale 2 puffs every six (6) hours as needed for wheezing. 8 g 11    amLODIPine (NORVASC) 2.5 MG tablet Take 3 tablets (7.5 mg total) by mouth daily.      aspirin (ECOTRIN) 81 MG tablet Take 1 tablet (81 mg total) by mouth daily.      atenolol (TENORMIN) 25 MG tablet TAKE 1 AND 1/2 TABLETS BY MOUTH  TWICE DAILY 300 tablet 1    atorvastatin (LIPITOR) 40 MG tablet Take 1 tablet (40 mg total) by mouth daily.      biotin 1 mg cap Take by mouth.      buPROPion (WELLBUTRIN XL) 300 MG 24 hr tablet Take 1 tablet (300 mg total) by mouth daily. 90 tablet 3    clonazePAM (KLONOPIN) 0.5 MG tablet Take 0.5 tablets (0.25 mg total) by mouth Three (3) times a day as needed for anxiety. 30 tablet 1    clotrimazole-betamethasone (LOTRISONE) 1-0.05 % cream Apply 1 Application topically two (2) times a day.      cyclobenzaprine (FLEXERIL) 5 MG tablet Take 1 tablet (5 mg total) by mouth nightly as needed for muscle spasms. 60 tablet 2    diclofenac sodium (VOLTAREN) 1 % gel Apply 2 g topically two (2) times a day as needed for arthritis. 300 g 5    dicyclomine (BENTYL) 20 mg tablet Take 1 tablet (20 mg total) by mouth daily as needed.      famotidine (PEPCID) 20 MG tablet TAKE 1 TABLET BY MOUTH TWICE  DAILY 200 tablet 2    fluticasone propionate (FLONASE) 50 mcg/actuation nasal spray       hydrALAZINE (APRESOLINE) 10 MG tablet       hydroxychloroquine (PLAQUENIL) 200 mg tablet Take 1 tablet (200 mg total) by mouth two (2) times a day. 180 tablet 3    hydrOXYzine (ATARAX) 10 MG tablet       inhalational spacing device (AEROCHAMBER MV) Spcr 1 each by Miscellaneous route two (2) times a day. With Fovent 1 each 0    lamoTRIgine (LAMICTAL) 200 MG tablet TAKE 1 TABLET BY MOUTH TWICE  DAILY 200 tablet 2    lidocaine (XYLOCAINE) 5 % ointment Apply topically two (2) times a day. 100 g 2    linaCLOtide (LINZESS) 290 mcg capsule Take 1 capsule (290 mcg total) by mouth daily. 30 capsule 5    loratadine (  CLARITIN) 10 mg tablet Take 1 tablet (10 mg total) by mouth daily.      melatonin 10 mg Tab Take 1 tablet by mouth.      metFORMIN (GLUCOPHAGE-XR) 500 MG 24 hr tablet Take 1 tablet (500 mg total) by mouth daily before breakfast.      mupirocin (BACTROBAN) 2 % ointment APP EXT IEN BID  0    mycophenolate (MYFORTIC) 360 MG TbEC Take 2 tablets (720 mg total) by mouth two (2) times a day. 360 tablet 3    olopatadine (PATANOL) 0.1 % ophthalmic solution Apply 1 drop to eye daily.      omeprazole (PRILOSEC) 40 MG capsule Take 1 capsule (40 mg total) by mouth two (2) times a day. 180 capsule 3    ondansetron (ZOFRAN) 4 MG tablet  (Patient not taking: Reported on 02/14/2023)      polyethylene glycol (GLYCOLAX) 17 gram/dose powder Take 17 g by mouth daily.      predniSONE (DELTASONE) 1 MG tablet Take 4 tablets (4 mg total) by mouth daily for 30 days, THEN 3 tablets (3 mg total) daily for 30 days, THEN 2 tablets (2 mg total) daily for 30 days, THEN 1 tablet (1 mg total) daily. 300 tablet 0    [START ON 05/25/2023] predniSONE (DELTASONE) 1 MG tablet Take 1 tablet (1 mg total) by mouth daily. To start once prednisone taper has been completed (should be on/around 05/25/23) 60 tablet 0    pregabalin (LYRICA) 50 MG capsule TAKE 1 CAPSULE (50 MG TOTAL) BY MOUTH THREE (3) TIMES A DAY. 90 capsule 1    telmisartan (MICARDIS) 80 MG tablet Take 1 tablet (80 mg total) by mouth.      traZODone (DESYREL) 100 MG tablet Take 1.5 tablets (150 mg total) by mouth nightly. 135 tablet 3     No current facility-administered medications for this visit.     ROS  General {ajlrosgen:46920}  Cardiovascular {aggROSCV:37303}  Gastrointestinal {aggROSGI:37304}  Skin {aggROSskin:37305}  Endocrine {aggROSendo:37306}  Musculoskeletal {aggROSmusk:37307}  Neurologic {aggROSneu:37308}  Psychiatric {aggROSpsych:37309}    Physical Exam    VITALS:   There were no vitals filed for this visit.    Wt Readings from Last 3 Encounters:   02/23/23 93.9 kg (207 lb)   02/14/23 93.4 kg (206 lb)   01/26/23 94.8 kg (209 lb)     GENERAL:  The patient is well developed, well-nourished, and appears to be in no apparent distress.   HEAD/NECK:    Normocephalic/atraumatic. clear sclera, pupils not pinpoint  CV:  Warm and well perfused.   LUNGS:   Normal work of breathing, no supplemental O2  EXTREMITIES:  No clubbing, cyanosis noted.  NEUROLOGIC:    The patient is alert and oriented, speech fluent, normal language.   MUSCULOSKELETAL:    Motor function  preserved.  GAIT:  The patient rises from a seated position with no difficulty and ambulates with nonantalgic gait without the assistance of a walking aid.   SKIN:   No obvious rashes, lesions, or erythema.  PSY:   Appropriate affect. No overt pain behaviors. No evidence of psychomotor retardation or agitation, no signs of intoxication.

## 2023-04-06 NOTE — Unmapped (Signed)
[facility] REH[facility] THERAPIES PT [address] [place]  OUTPATIENT PHYSICAL THERAPY  04/06/2023          Patient Name: Caitlin Smith  Date of Birth:[birth year]  Diagnosis:   Encounter Diagnoses   Name Primary?    Pelvic floor dysfunction in female Yes    Chronic idiopathic constipation      Referring MD:  Corinda Gubler, *     Plan of Care Effective Date: 02/28/2023 - 05/29/2023  04/06/23        Assessment  Assessment details:    67 year old female presents in the clinic with worsening chronic constipation with pelvic floor dysfunction.  Patient with elevated resting tone of the pelvic floor muscles with decreased ability to relax her pelvic floor muscles appropriately. Patient with marked increase of urinary urgency and incontinence this past week. Patient will likely benefit from skilled physical therapy to improve her ability to have a bowel movement at least every other day without straining through manual therapy, therapeutic exercise, patient education such as learning toilet posture and behavioral techniques to improve her pelvic floor function as well as a home exercise program.          Impairments: bowel dysfunction, pain and urinary incontinence                Prognosis: good prognosis    Positive Prognosis Rationale: motivated for treatment.  Negative Prognosis Rationale: chronicity of condition and severity of symptoms.    Barriers to therapy: transportation    Therapy Goals      Goals:      Short Term Goal - 4 weeks  1. Patient independent with HEP to learn improved toilet postures for effectively emptying her bowels.      Long Term Goals - 16 weeks  1.  Patient able to have a bowel movement every other day without straining.  2.  Patient able to sneeze without leaking urine.  3.  Patient able to feel that she is completely emptying her bowels.    Plan    Therapy options: will be seen for skilled physical therapy services    Planned therapy interventions: 97110-Therapeutic Exercises, 97112-Neuromuscular Re-education, 97140-Manual Therapy, 97530-Therapeutic Activities and 97535-Self-Care/Home Training      Frequency: 1x week    Duration in weeks: 16    Education provided to: patient.    Education provided: Teacher, music, HEP, Toileting posture and Self-soft tissue mobilization    Education results: needs reinforcement.      Next visit plan:        Biofeedback?    Total Session Time: 45    Treatment rendered today:      Manual Therapy 30'  STM for I, L, U massage with flat hand and circles as well as medium cup with medium suction with use of deep tissue massage lotion.        Self Care 10'  Discussed her current urinary symptoms and encouraged patient to try to follow up sooner with her PCP as able.  A note was sent on her behalf from this therapist to her PCP.    5 minutes of appointment was wallking in and out of treatment room and changing into and out of a gown and confirming next appointment.             History of Present Condition     History of Present Condition/Chief Complaint:  Chronic constipation, pelvic floor dysfunction  Subjective:  Patient stated she has been having incontinence that  she is unable to control her urine.  Patient was diagnosed with a UTI two weeks ago and she finished the medication.  When she has burning pain in her hands with urination and it also started when she had the increase in the urine leakage.  Patient is having sharp pain in the low back which started with the increase in incontinence as well. Patient stated she has also been falling more often.  Patient stated she fell in her house yesterday. She goes to see the pain doctor tomorrow.  Patient stated the constipation has been better.  She has not been able to do all of her exercises because she feels that doing the relaxation causes her to lose more urine.     Quality of life:  Fair  Pain:     Current pain rating:  7    At best pain rating:  6    At worst pain rating:  10  Pain Comments: Chronic pain - had a knee replacement on the right.  Did not heal well.  Patient stated she has a lot of pain in the left knee.  Patient chose not to replace the left knee at this time.  Patient with fibromyalgia and has lung disease so has to walk.      Quality:  Aching and sore    Physical limitation(s):  Knee pain  Current Functional Status:  Limited walking tolerance  Social Support:     Lives Environment:  Apartment and stairs (one flight)    Lives with:  Alone    Work/School:  Walks in the morning for 45-60 minutes to improve her lung disease  Treatments:     Current treatment: medication    Patient Goals:     Patient/Family goals for therapy:  Decrease/Eliminate UI and improve bowel function      OB/GYN History:  History of Pregnancy?: Yes  Pregnancy Type: Gravida, Para, Vaginal Deliveries  # of Gravida: 2   # of Para : 2  # of Vaginal Deliveries: 2        Sexual History:  Sexual Dysfunction: No (patient hasn't been sexually active, but woudl like to be,  She stated she did not have any sensation.)            Urinary Symptoms:  Fluid Intake: water - 40 oz, tea,  # of voids : 3, 4, per day       # of voids per night: per night, 2  Urinary urgency?: Yes  Urinary incontinence?: Yes       Urinary Incontinence types : SUI (sneeze or cough)                     Bowel Symptoms:  # of BMs: 0, 1, 2, per week (two capfuls a day of miralax but was making her sick.  Takes Linzess daily.  If takes a pill, it takes two to three days to work.)      Stool Type (based on [place] Stool Chart): V      Straining?: Yes  Fecal urgency?: No      Fecal incontinence?: No                  Postural Observations  Seated posture: fair  Standing posture: fair      Ambulation   Observational Gait   Gait: antalgic   Decreased walking speed and stride length.     Pelvic Floor  Verbal consent given from the patient before  start of pelvic floor exam including single digit penetration into the vaginal opening.    Observation  Skin WNL  Visible Contraction minimal  Visible Relaxation minimal, delayed    Palpation  Patient with mild increased resting tone grossly of the bilateral levator ani without complaints of tenderness to palpation.    Pelvic Floor Strength - Laycock Scale  Power - 2  Endurance 3  Repetitions 5  Fast Repetitions not tested due to delayed relaxation following contraction of the pelvic floor.  Patient with initial contraction of the pelvic floor following cue to relax pelvic floor.  Patient able to slowly relax after several seconds.       I attest that I have reviewed the above information.  Signed: Gaylyn Lambert, PT  04/06/2023 9:01 AM      All information regarding the expectations of participation in pelvic floor physical therapy, including but not limited to, the need for internal and/or external pelvic floor assessment, treatment techniques and plan of care were thoroughly discussed.  Patient confirms understanding that (s)he may bring a third party to any or all physical therapy sessions and can withdraw consent at any point throughout the plan of care.  Patient and guardian (if applicable) verbalize understanding of all above information and consent to pelvic floor assessment and treatment at evaluation and all future physical therapy sessions.

## 2023-04-07 ENCOUNTER — Other Ambulatory Visit: Payer: Self-pay

## 2023-04-07 ENCOUNTER — Emergency Department
Admission: EM | Admit: 2023-04-07 | Discharge: 2023-04-07 | Disposition: A | Discharge status: Home / Self Care | Payer: 59 | Attending: Emergency Medicine | Admitting: Emergency Medicine

## 2023-04-07 ENCOUNTER — Emergency Department: Payer: 59

## 2023-04-07 DIAGNOSIS — M62830 Muscle spasm of back: Secondary | ICD-10-CM | POA: Diagnosis not present

## 2023-04-07 DIAGNOSIS — N390 Urinary tract infection, site not specified: Secondary | ICD-10-CM

## 2023-04-07 DIAGNOSIS — R9389 Abnormal findings on diagnostic imaging of other specified body structures: Secondary | ICD-10-CM | POA: Diagnosis not present

## 2023-04-07 DIAGNOSIS — R109 Unspecified abdominal pain: Secondary | ICD-10-CM | POA: Diagnosis not present

## 2023-04-07 DIAGNOSIS — N3289 Other specified disorders of bladder: Secondary | ICD-10-CM

## 2023-04-07 DIAGNOSIS — R350 Frequency of micturition: Secondary | ICD-10-CM | POA: Diagnosis present

## 2023-04-07 LAB — BLOOD CULTURE ID PANEL (REFLEXED) - BCID2

## 2023-04-07 LAB — URINALYSIS, ROUTINE W REFLEX MICROSCOPIC
Bilirubin Urine: NEGATIVE
Glucose, UA: NEGATIVE mg/dL
Ketones, ur: NEGATIVE mg/dL
Nitrite: NEGATIVE
Protein, ur: 100 mg/dL — AB
Specific Gravity, Urine: 1.016 (ref 1.005–1.030)
WBC, UA: >50 WBC/hpf (ref 0–5)
pH: 5 (ref 5.0–8.0)

## 2023-04-07 LAB — CULTURE, BLOOD (SINGLE): Special Requests: ADEQUATE

## 2023-04-07 LAB — COMPREHENSIVE METABOLIC PANEL
ALT: 17 U/L (ref 0–44)
AST: 19 U/L (ref 15–41)
Albumin: 3.5 g/dL (ref 3.5–5.0)
Alkaline Phosphatase: 61 U/L (ref 38–126)
Anion gap: 8 (ref 5–15)
BUN: 11 mg/dL (ref 8–23)
CO2: 27 mmol/L (ref 22–32)
Calcium: 8.8 mg/dL — ABNORMAL LOW (ref 8.9–10.3)
Chloride: 101 mmol/L (ref 98–111)
Creatinine, Ser: 0.93 mg/dL (ref 0.44–1.00)
GFR, Estimated: >60 mL/min (ref >60)
Glucose, Bld: 102 mg/dL — ABNORMAL HIGH (ref 70–99)
Potassium: 3.6 mmol/L (ref 3.5–5.1)
Sodium: 136 mmol/L (ref 135–145)
Total Bilirubin: 1.1 mg/dL (ref 0.3–1.2)
Total Protein: 7.8 g/dL (ref 6.5–8.1)

## 2023-04-07 LAB — CBC WITH DIFFERENTIAL/PLATELET
Abs Immature Granulocytes: 0.02 10*3/uL (ref 0.00–0.07)
Basophils Absolute: 0.1 10*3/uL (ref 0.0–0.1)
Basophils Relative: 1 %
Eosinophils Absolute: 0.1 10*3/uL (ref 0.0–0.5)
Eosinophils Relative: 1 %
HCT: 40.1 % (ref 36.0–46.0)
Hemoglobin: 13 g/dL (ref 12.0–15.0)
Immature Granulocytes: 0 %
Lymphocytes Relative: 13 %
Lymphs Abs: 0.9 10*3/uL (ref 0.7–4.0)
MCH: 28.1 pg (ref 26.0–34.0)
MCHC: 32.4 g/dL (ref 30.0–36.0)
MCV: 86.6 fL (ref 80.0–100.0)
Monocytes Absolute: 1.3 10*3/uL — ABNORMAL HIGH (ref 0.1–1.0)
Monocytes Relative: 19 %
Neutro Abs: 4.4 10*3/uL (ref 1.7–7.7)
Neutrophils Relative %: 66 %
Platelets: 249 10*3/uL (ref 150–400)
RBC: 4.63 MIL/uL (ref 3.87–5.11)
RDW: 12.8 % (ref 11.5–15.5)
WBC: 6.7 10*3/uL (ref 4.0–10.5)
nRBC: 0 % (ref 0.0–0.2)

## 2023-04-07 LAB — LACTIC ACID, PLASMA: Lactic Acid, Venous: 0.9 mmol/L (ref 0.5–1.9)

## 2023-04-07 MED ORDER — SODIUM CHLORIDE 0.9 % IV BOLUS
1000.0000 mL | Freq: Once | INTRAVENOUS | Status: AC
  Administered 2023-04-07: 1000 mL via INTRAVENOUS

## 2023-04-07 MED ORDER — DOCUSATE SODIUM 100 MG PO CAPS: 100.0000 mg | ORAL_CAPSULE | Freq: Every day | ORAL | 0 refills | Status: AC

## 2023-04-07 MED ORDER — CEFDINIR 300 MG PO CAPS: 300.0000 mg | ORAL_CAPSULE | Freq: Two times a day (BID) | ORAL | 0 refills | Status: AC

## 2023-04-07 MED ORDER — PHENAZOPYRIDINE HCL 200 MG PO TABS: 200.0000 mg | ORAL_TABLET | Freq: Three times a day (TID) | ORAL | 0 refills | Status: AC

## 2023-04-07 MED ORDER — PHENAZOPYRIDINE HCL 200 MG PO TABS
200.0000 mg | ORAL_TABLET | Freq: Once | ORAL | Status: AC
  Administered 2023-04-07: 200 mg via ORAL
  Filled 2023-04-07: qty 1

## 2023-04-07 MED ORDER — POLYETHYLENE GLYCOL 3350 17 G PO PACK: 17.0000 g | PACK | Freq: Two times a day (BID) | ORAL | 0 refills | Status: DC | PRN

## 2023-04-07 MED ORDER — SODIUM CHLORIDE 0.9 % IV SOLN
2.0000 g | Freq: Once | INTRAVENOUS | Status: AC
  Administered 2023-04-07: 2 g via INTRAVENOUS
  Filled 2023-04-07: qty 20

## 2023-04-07 NOTE — Discharge Instructions (Signed)
Take the antibiotics and pyridium as prescribed  Take over-the-counter Ibuprofen and Tylenol for pain  For your constipation, you should start taking a stool softener daily - I'd recommend colace (docusate) - this can be purchased over the counter but I have also provided a prescription  For the next 2-3 days, I'd recommend taking Miralax 2-3 times a day until bowel movements are soft and you are having regular bowel movements

## 2023-04-07 NOTE — ED Triage Notes (Signed)
Pt presents to ER with c/o lower back pain and increased urinary frequency.  Pt reports having recent UTI, but over the last week, has had some increased lower back pain, and difficulty fully emptying her bladder.  Pt also reports some nasuea, and burning w/urination.  Pt has hx of CKD.  Pt otherwise A&O x4 and in NAD in triage.

## 2023-04-07 NOTE — ED Provider Notes (Signed)
[facility] Provider Note    Event Date/Time   First MD Initiated Contact with Patient 04/07/23 0423     (approximate)   History   Back Pain and Urinary Frequency   HPI  Nicole Ballard is a 67 y.o. female  here with severe urinary pain, burning, lower abd and back pain. Pain has been ongoing x several days, described as an aching, cramping pain I her lower abdomen and back, that is severely and acutely worse with urination. She has been feeling the need to urinate followed by "just a few drops" with severe burning in her urethral area, followed by some episodes where she doesn't feel the need but has a large amount of urine produced, sometimes before she can get to the bathroom. Recent dx and tx of UTI 3 weeks ago, but did not have anywhere near the severity of sx currently has. No falls. No known sacral or lumbar disease. No loss of bowel continence. No saddle anesthesia.        Physical Exam   Triage Vital Signs: ED Triage Vitals [04/07/23 0426]  Encounter Vitals Group     BP (!) 136/95     Systolic BP Percentile      Diastolic BP Percentile      Pulse Rate 87     Resp 18     Temp 100.1 F (37.8 C)     Temp Source Oral     SpO2 100 %     Weight 197 lb (89.4 kg)     Height 5\' 8"  (1.727 m)     Head Circumference      Peak Flow      Pain Score 6     Pain Loc      Pain Education      Exclude from Growth Chart     Most recent vital signs: Vitals:   04/07/23 0600 04/07/23 0810  BP: 115/82 (!) 162/73  Pulse:  75  Resp:    Temp:    SpO2:       General: Awake, no distress.  CV:  Good peripheral perfusion. RRR. Resp:  Normal work of breathing. Lungs clear. Abd:  No distention. Mild suprapubic TTP, slight guarding. No rebound. No overt CVAT. Other:  Strength 5/5 bl LE proximally and distally. Sensation to light touch and pinprick intact bl LE.   ED Results / Procedures / Treatments   Labs (all labs ordered are listed, but only  abnormal results are displayed) Labs Reviewed  URINALYSIS, ROUTINE W REFLEX MICROSCOPIC - Abnormal; Notable for the following components:      Result Value   Color, Urine AMBER (*)    APPearance TURBID (*)    Hgb urine dipstick SMALL (*)    Protein, ur 100 (*)    Leukocytes,Ua LARGE (*)    Bacteria, UA RARE (*)    All other components within normal limits  CBC WITH DIFFERENTIAL/PLATELET - Abnormal; Notable for the following components:   Monocytes Absolute 1.3 (*)    All other components within normal limits  COMPREHENSIVE METABOLIC PANEL - Abnormal; Notable for the following components:   Glucose, Bld 102 (*)    Calcium 8.8 (*)    All other components within normal limits  URINE CULTURE  CULTURE, BLOOD (SINGLE)  LACTIC ACID, PLASMA     EKG    RADIOLOGY CT Stone: No acute finding   I also independently reviewed and agree with radiologist interpretations.   PROCEDURES:  Critical Care  performed: No   MEDICATIONS ORDERED IN ED: Medications  sodium chloride 0.9 % bolus 1,000 mL (0 mLs Intravenous Stopped 04/07/23 0812)  phenazopyridine (PYRIDIUM) tablet 200 mg (200 mg Oral Given 04/07/23 0512)  cefTRIAXone (ROCEPHIN) 2 g in sodium chloride 0.9 % 100 mL IVPB (0 g Intravenous Stopped 04/07/23 0812)     IMPRESSION / MDM / ASSESSMENT AND PLAN / ED COURSE  I reviewed the triage vital signs and the nursing notes.                              Differential diagnosis includes, but is not limited to, UTI, bladder spasms, nephrolithiasis, urethritis, fecal impaction with urinary retention and overflow incontinence, unlikely cauda equina  Patient's presentation is most consistent with acute presentation with potential threat to life or bodily function.  The patient is on the cardiac monitor to evaluate for evidence of arrhythmia and/or significant heart rate changes  67 yo F here with suprapubic pain, dysuria, back pain. Suspect symptomatic UTI with bladder spasms. UA shows  significant pyuria and bacteriuria and sx all correlate with urination. No vaginal bleeding or discharge. No GI sx. WBC normal, Hgb is normal. CMP unremarkable. LA is normal. CT Stone obtained given the degree of pain and is overall unremarkable, with moderate stool burden which may be contributing to some of her recurrent urinary sx. She has no fever, leukocytosis, flank pain or signs of pyelo or sepsis. Will tx for UTI, and increase her bowel regimen at home. Return precautions given.    FINAL CLINICAL IMPRESSION(S) / ED DIAGNOSES   Final diagnoses:  Lower urinary tract infectious disease  Bladder spasm     Rx / DC Orders   ED Discharge Orders          Ordered    cefdinir (OMNICEF) 300 MG capsule  2 times daily        04/07/23 0635    phenazopyridine (PYRIDIUM) 200 MG tablet  3 times daily with meals        04/07/23 0635    docusate sodium (COLACE) 100 MG capsule  Daily        04/07/23 0635    polyethylene glycol (MIRALAX) 17 g packet  2 times daily PRN        04/07/23 1610             Note:  This document was prepared using Dragon voice recognition software and may include unintentional dictation errors.   Shaune Pollack, MD 04/07/23 [phone]

## 2023-04-10 ENCOUNTER — Telehealth: Payer: Self-pay | Admitting: Emergency Medicine

## 2023-04-10 ENCOUNTER — Encounter: Payer: Self-pay | Admitting: Family Medicine

## 2023-04-10 ENCOUNTER — Ambulatory Visit (INDEPENDENT_AMBULATORY_CARE_PROVIDER_SITE_OTHER): Payer: 59 | Admitting: Family Medicine

## 2023-04-10 VITALS — BP 122/76 | HR 88 | Temp 97.8°F | Resp 16 | Ht 68.0 in | Wt 198.5 lb

## 2023-04-10 DIAGNOSIS — B9620 Unspecified Escherichia coli [E. coli] as the cause of diseases classified elsewhere: Secondary | ICD-10-CM | POA: Diagnosis not present

## 2023-04-10 DIAGNOSIS — N390 Urinary tract infection, site not specified: Secondary | ICD-10-CM

## 2023-04-10 DIAGNOSIS — R7881 Bacteremia: Secondary | ICD-10-CM | POA: Diagnosis not present

## 2023-04-10 DIAGNOSIS — R319 Hematuria, unspecified: Secondary | ICD-10-CM | POA: Diagnosis not present

## 2023-04-10 DIAGNOSIS — R300 Dysuria: Secondary | ICD-10-CM | POA: Diagnosis not present

## 2023-04-10 NOTE — Patient Instructions (Signed)
[facility]   P: [phone] F: [phone]   Release ID # [id number]

## 2023-04-10 NOTE — Telephone Encounter (Signed)
Called patient to check on condition and follow up plans.  Says she is getting better.  Says no fever, no urinary incontinence and feeling better in general.  She does have some nausea.  I explained the positive blood culture that was drawn prior to antibiotics.  I told her that since she is feeling better she should be able to do her follow up at her pcp. She agrees to call pcp today to get close follow up.

## 2023-04-10 NOTE — Progress Notes (Signed)
Name: SYMPHONY CRIADO   MRN: [id number]    DOB: [birth year]   Date:04/10/2023       Progress Note  Subjective  Chief Complaint  ER Follow up 04/07/2023 UTI  HPI  UTI with positive blood culture for E. Coli: she went to [facility] on 04/07/2023 due to nausea, urinary incontinence, incomplete voiding when she was able to use the bathroom, fatigue, no fever or chills. She had a poor appetite. She is had IV fluids with Rocephin and sent home on Omnicef to take for 7 days ,  She was contacted by the hospital today due to positive blood culture. She still urinary incontinence resolved but still feels tired and has poor appetite.  We will recheck labs and urine culture, finish omnicef given for 7 days      Patient Active Problem List   Diagnosis Date Noted   Genetic testing 03/14/2023   Abnormal SPEP 01/13/2023   Weight loss 01/13/2023   Family history of breast cancer 01/13/2023   Asthma, chronic, severe persistent, uncomplicated 10/12/2022   Other vascular myelopathies (HCC) 10/12/2022   Interstitial lung disease (HCC) 07/12/2022   Empty sella (HCC) 07/12/2022   History of craniotomy 07/12/2022   Immunocompromised (HCC) 04/29/2021   Hyperglycemia 10/23/2017   Vertigo 09/29/2016   Bronchiolitis obliterans organizing pneumonia (HCC) 09/29/2016   Neck pain, musculoskeletal 12/15/2015   Cough in adult 09/11/2015   Abnormal CAT scan 04/11/2015   Auditory impairment 04/11/2015   Insomnia, persistent 04/11/2015   Chronic kidney disease (CKD), stage I 04/11/2015   Chronic nonmalignant pain 04/11/2015   Chronic constipation 04/11/2015   Dyslipidemia 04/11/2015   Fibromyalgia 04/11/2015   Gastro-esophageal reflux disease without esophagitis 04/11/2015   Dysphonia 04/11/2015   Low back pain 04/11/2015   Eczema intertrigo 04/11/2015   Keratosis pilaris 04/11/2015   Chronic recurrent major depressive disorder (HCC) 04/11/2015   Dysmetabolic syndrome 04/11/2015   Neurogenic claudication  04/11/2015   Perennial allergic rhinitis with seasonal variation 04/11/2015   Abnormal presence of protein in urine 04/11/2015   Seborrhea capitis 04/11/2015   Moderate dysplasia of vulva 04/11/2015   Vitamin D deficiency 04/11/2015   Spinal stenosis of lumbar region 04/11/2015   Treadmill stress test negative for angina pectoris 03/05/2015   Shortness of breath 05/20/2014   Morbid obesity (HCC) 02/03/2014   Asthma, chronic 12/31/2013   H/O total knee replacement 12/31/2013   Episcleritis 09/26/2013   Vocal cord dysfunction 05/28/2013   Anxiety, generalized 03/19/2013   Back pain 12/18/2012   Pulmonary nodules 11/26/2012   Lung nodule, multiple 11/26/2012   Arthritis, degenerative 11/01/2012   H/O aspiration pneumonitis 10/22/2012   Postinflammatory pulmonary fibrosis (HCC) 10/22/2012   Mixed incontinence 05/04/2012   Lung involvement in systemic lupus erythematosus (HCC) 11/05/2010   Chronic nausea 07/01/2008   Benign hypertension 01/25/2005    Past Surgical History:  Procedure Laterality Date   BRAIN SURGERY     CHOLECYSTECTOMY     VAGINAL HYSTERECTOMY     vulvar surgery      Family History  Problem Relation Age of Onset   Diabetes Mother    Hypertension Mother    Breast cancer Mother    Stomach cancer Father        d.>50   Breast cancer Maternal Aunt        dx >50   Breast cancer Maternal Aunt        dx 68s   Breast cancer Maternal Aunt    Cancer Maternal Uncle  unk type   Cancer Maternal Uncle        unk type   Breast cancer Cousin        dx 98s   Breast cancer Cousin        dx 30s, does not want genetic testing    Social History   Tobacco Use   Smoking status: Never   Smokeless tobacco: Never  Substance Use Topics   Alcohol use: No    Alcohol/week: 0.0 standard drinks of alcohol     Current Outpatient Medications:    albuterol (VENTOLIN HFA) 108 (90 Base) MCG/ACT inhaler, SMARTSIG:2 Puff(s) By Mouth Every 6 Hours PRN, Disp: , Rfl:     amLODipine (NORVASC) 2.5 MG tablet, Take 3 tablets (7.5 mg total) by mouth daily., Disp: 270 tablet, Rfl: 3   aspirin EC 81 MG tablet, Take 1 tablet (81 mg total) by mouth daily., Disp: 30 tablet, Rfl: 0   atenolol (TENORMIN) 25 MG tablet, Take 1 tablet by mouth in the morning and at bedtime., Disp: , Rfl:    atorvastatin (LIPITOR) 40 MG tablet, Take 1 tablet (40 mg total) by mouth daily., Disp: 90 tablet, Rfl: 3   Biotin 1 MG CAPS, Take 1 capsule by mouth daily., Disp: 30 capsule, Rfl: 0   buPROPion (WELLBUTRIN XL) 300 MG 24 hr tablet, Take 300 mg by mouth in the morning., Disp: , Rfl:    cefdinir (OMNICEF) 300 MG capsule, Take 1 capsule (300 mg total) by mouth 2 (two) times daily for 7 days., Disp: 14 capsule, Rfl: 0   Cholecalciferol (VITAMIN D) 2000 UNITS CAPS, Take 1 capsule by mouth daily., Disp: , Rfl:    clobetasol ointment (TEMOVATE) 0.05 %, Apply 1 application topically 2 (two) times daily., Disp: , Rfl:    clonazePAM (KLONOPIN) 0.5 MG tablet, Take 0.25 mg by mouth at bedtime., Disp: , Rfl:    clotrimazole-betamethasone (LOTRISONE) cream, Apply 1 application topically 2 (two) times daily., Disp: 45 g, Rfl: 0   cyclobenzaprine (FLEXERIL) 5 MG tablet, Take by mouth., Disp: , Rfl:    diclofenac Sodium (VOLTAREN) 1 % GEL, Apply topically daily as needed., Disp: , Rfl:    docusate sodium (COLACE) 100 MG capsule, Take 1 capsule (100 mg total) by mouth daily., Disp: 30 capsule, Rfl: 0   fluticasone (FLONASE) 50 MCG/ACT nasal spray, Place 2 sprays into both nostrils daily., Disp: 48 g, Rfl: 3   hydroxychloroquine (PLAQUENIL) 200 MG tablet, Take by mouth 2 (two) times daily., Disp: , Rfl:    hydrOXYzine (ATARAX) 10 MG tablet, Take 1 tablet (10 mg total) by mouth 3 (three) times daily as needed., Disp: 30 tablet, Rfl: 0   lamoTRIgine (LAMICTAL) 200 MG tablet, Take 400 mg by mouth at bedtime., Disp: , Rfl:    LINZESS 145 MCG CAPS capsule, Take 145 mcg by mouth daily., Disp: , Rfl:    loratadine  (CLARITIN) 10 MG tablet, Take 1 tablet (10 mg total) by mouth daily., Disp: 90 tablet, Rfl: 1   Melatonin 10 MG TABS, Take 1 tablet by mouth at bedtime., Disp: , Rfl:    metFORMIN (GLUCOPHAGE-XR) 500 MG 24 hr tablet, TAKE 1 TABLET BY MOUTH DAILY  WITH BREAKFAST, Disp: 100 tablet, Rfl: 2   metoprolol succinate (TOPROL-XL) 25 MG 24 hr tablet, Take 1 tablet (25 mg total) by mouth daily. In place of Atenolol, Disp: 30 tablet, Rfl: 0   mycophenolate (MYFORTIC) 360 MG TBEC EC tablet, Take 720 mg by mouth 2 (two) times  daily., Disp: , Rfl:    olopatadine (PATANOL) 0.1 % ophthalmic solution, Place 1 drop into both eyes 2 (two) times daily., Disp: 5 mL, Rfl: 1   omeprazole (PRILOSEC) 40 MG capsule, Take 1 capsule by mouth in the morning and at bedtime. Pulmonologist, Disp: , Rfl:    phenazopyridine (PYRIDIUM) 200 MG tablet, Take 1 tablet (200 mg total) by mouth 3 (three) times daily with meals for 3 days., Disp: 9 tablet, Rfl: 0   polyethylene glycol (MIRALAX) 17 g packet, Take 17 g by mouth 2 (two) times daily as needed for moderate constipation., Disp: 20 each, Rfl: 0   [START ON 05/25/2023] predniSONE (DELTASONE) 1 MG tablet, Take by mouth., Disp: , Rfl:    predniSONE (DELTASONE) 2.5 MG tablet, Take 2 tablets by mouth daily at 12 noon., Disp: , Rfl:    pregabalin (LYRICA) 50 MG capsule, Take 50 mg by mouth 3 (three) times daily., Disp: , Rfl:    telmisartan (MICARDIS) 80 MG tablet, Take 1 tablet (80 mg total) by mouth daily., Disp: 90 tablet, Rfl: 3   traZODone (DESYREL) 100 MG tablet, Take 150 mg by mouth at bedtime., Disp: , Rfl:    triamcinolone ointment (KENALOG) 0.1 %, , Disp: , Rfl:    famotidine (PEPCID) 20 MG tablet, Take 20 mg by mouth daily., Disp: , Rfl:   Allergies  Allergen Reactions   Ace Inhibitors     Other reaction(s): OTHER Pt states she can not take ace inhibitors. Pt states she can not remember her reaction.    Bee Pollen    Penicillins    Pollen Extract     I personally  reviewed active problem list, medication list, allergies with the patient/caregiver today.   ROS  Ten systems reviewed and is negative except as mentioned in HPI    Objective  Vitals:   04/10/23 1430  BP: 122/76  Pulse: 88  Resp: 16  Temp: 97.8 F (36.6 C)  TempSrc: Oral  SpO2: 94%  Weight: 198 lb 8 oz (90 kg)  Height: 5\' 8"  (1.727 m)    Body mass index is 30.18 kg/m.  Physical Exam  Constitutional: Patient appears well-developed and well-nourished. Obese  No distress.  HEENT: head atraumatic, normocephalic, pupils equal and reactive to light, neck supple Cardiovascular: Normal rate, regular rhythm and normal heart sounds.  No murmur heard. No BLE edema. Pulmonary/Chest: Effort normal and breath sounds normal. No respiratory distress. Abdominal: Soft.  There is no tenderness. Negative CVA  Psychiatric: Patient has a normal mood and affect. behavior is normal. Judgment and thought content normal.   Recent Results (from the past 2160 hour(s))  Comprehensive metabolic panel     Status: None   Collection Time: 01/13/23 11:18 AM  Result Value Ref Range   Sodium 136 135 - 145 mmol/L   Potassium 4.6 3.5 - 5.1 mmol/L   Chloride 98 98 - 111 mmol/L   CO2 27 22 - 32 mmol/L   Glucose, Bld 78 70 - 99 mg/dL    Comment: Glucose reference range applies only to samples taken after fasting for at least 8 hours.   BUN 16 8 - 23 mg/dL   Creatinine, Ser 8.41 0.44 - 1.00 mg/dL   Calcium 9.1 8.9 - 32.4 mg/dL   Total Protein 7.9 6.5 - 8.1 g/dL   Albumin 3.9 3.5 - 5.0 g/dL   AST 22 15 - 41 U/L   ALT 17 0 - 44 U/L   Alkaline Phosphatase 59 38 -  126 U/L   Total Bilirubin 0.3 0.3 - 1.2 mg/dL   GFR, Estimated >86 >57 mL/min    Comment: (NOTE) Calculated using the CKD-EPI Creatinine Equation (2021)    Anion gap 11 5 - 15    Comment: Performed at [facility], [address] [place], [place] [address]  CBC with Differential/Platelet     Status: Abnormal   Collection Time:  01/13/23 11:18 AM  Result Value Ref Range   WBC 3.9 (L) 4.0 - 10.5 K/uL   RBC 4.71 3.87 - 5.11 MIL/uL   Hemoglobin 13.1 12.0 - 15.0 g/dL   HCT 29.5 28.4 - 13.2 %   MCV 89.8 80.0 - 100.0 fL   MCH 27.8 26.0 - 34.0 pg   MCHC 31.0 30.0 - 36.0 g/dL   RDW 44.0 10.2 - 72.5 %   Platelets 294 150 - 400 K/uL   nRBC 0.0 0.0 - 0.2 %   Neutrophils Relative % 43 %   Neutro Abs 1.7 1.7 - 7.7 K/uL   Lymphocytes Relative 41 %   Lymphs Abs 1.6 0.7 - 4.0 K/uL   Monocytes Relative 11 %   Monocytes Absolute 0.4 0.1 - 1.0 K/uL   Eosinophils Relative 2 %   Eosinophils Absolute 0.1 0.0 - 0.5 K/uL   Basophils Relative 2 %   Basophils Absolute 0.1 0.0 - 0.1 K/uL   Immature Granulocytes 1 %   Abs Immature Granulocytes 0.02 0.00 - 0.07 K/uL    Comment: Performed at [facility], [address] [place], [place] [address]  Lactate dehydrogenase     Status: None   Collection Time: 01/13/23 11:18 AM  Result Value Ref Range   LDH 167 98 - 192 U/L    Comment: Performed at [facility], [address] [place], [place] [address]  Flow cytometry panel-leukemia/lymphoma work-up     Status: None   Collection Time: 01/13/23 11:18 AM  Result Value Ref Range   PATH INTERP XXX-IMP Comment     Comment: (NOTE) No significant immunophenotypic abnormality detected B cell clonality could not be evaluated due to non specific staining, see comment.    ANNOTATION COMMENT IMP Comment     Comment: (NOTE) Nonspecific light chain binding can sometimes be seen in the setting of increased serum proteins. Clinical correlation is recommended. Repeat testing may be useful.    CLINICAL INFO Comment     Comment: (NOTE) Accompanying CBC dated 01-13-23 shows: WBC count 3.9, Neu 1.7, Lym 1.6, Mon 0.4.    Specimen Type Comment     Comment: Peripheral blood   ASSESSMENT OF LEUKOCYTES Comment     Comment: (NOTE) Kappa and lambda staining cannot be interpreted due to nonspecific light chain binding. There is no  loss of, or aberrant expression of, the pan T cell antigens to suggest a neoplastic T cell process. CD4:CD8 ratio 1.1 CD57 positive cells are increased and  are composed of a mixture of CD4 and CD8 positive T cells and NK cells, most consistent with a reactive process. CD57 is a marker of large granular lymphocytes. No circulating blasts are detected. There is no immunophenotypic  evidence of abnormal myeloid maturation. Mature monocytes show aberrant expression of CD56, a finding that can be seen in association with both reactive/activated processes as well as neoplastic processes. Analysis of the leukocyte population shows: granulocytes 55%, monocytes 6%, lymphocytes 39%, blasts <0.1%, B cells 4%, T cells 31%, LGLs 11%, NK cells 4%.    % Viable Cells Comment  Comment: 95%   ANALYSIS AND GATING STRATEGY Comment     Comment: (NOTE) 8 color analysis with CD45/SSC ZEBNC    IMMUNOPHENOTYPING STUDY Comment     Comment: (NOTE) CD2       Normal         CD3       Normal CD4       Normal         CD5       Normal CD7       Normal         CD8       Normal CD10      Normal         CD11b     Normal CD13      Normal         CD14      Normal CD16      Normal         CD19      Normal CD20      Normal         CD33      Normal CD34      Normal         CD38      Normal CD45      Normal         CD56      Normal CD57      Normal         CD117     Normal HLA-DR    Normal         KAPPA     See Text LAMBDA    See Text       CD64      Normal    PATHOLOGIST NAME Comment     Comment: Porfirio Oar, M.D.   COMMENT: Comment     Comment: (NOTE) Each antibody in this assay was utilized to assess for potential abnormalities of studied cell populations or to characterize identified abnormalities. This test was developed and its performance characteristics determined by [facility].  It has not been cleared or approved by the U.S. Food and Drug Administration. The FDA has determined that such  clearance or approval is not necessary. This test is used for clinical purposes.  It should not be regarded as investigational or for research. Performed At: [facility] [address] [place], [place] [id number] Maurine Simmering MDPhD XN:[id number] Performed At: [facility] [address] [place], [place] [id number] Maurine Simmering MDPhD [id number] Performed At: [facility] [place] [address] [place], [place] [id number] Maurine Simmering MD [id number]   Kappa/lambda light chains     Status: Abnormal   Collection Time: 01/13/23 11:18 AM  Result Value Ref Range   Kappa free light chain 36.3 (H) 3.3 - 19.4 mg/L   Lambda free light chains 20.2 5.7 - 26.3 mg/L   Kappa, lambda light chain ratio 1.80 (H) 0.26 - 1.65    Comment: (NOTE) Performed At: [facility] [place] [address] [place], [place] [id number] Jolene Schimke MD [id number]:[id number]   Multiple Myeloma Panel (SPEP&IFE w/QIG)     Status: Abnormal   Collection Time: 01/13/23 11:19 AM  Result Value Ref Range   IgG (Immunoglobin G), Serum 1,627 (H) 586 - 1,602 mg/dL   IgA 175 (H) 87 - 102 mg/dL   IgM (Immunoglobulin M), Srm 148 26 - 217 mg/dL   Total Protein ELP 7.3 6.0 - 8.5 g/dL   Albumin SerPl Elph-Mcnc 3.7 2.9 -  4.4 g/dL   Alpha 1 0.3 0.0 - 0.4 g/dL   Alpha2 Glob SerPl Elph-Mcnc 0.6 0.4 - 1.0 g/dL   B-Globulin SerPl Elph-Mcnc 1.0 0.7 - 1.3 g/dL   Gamma Glob SerPl Elph-Mcnc 1.7 0.4 - 1.8 g/dL   M Protein SerPl Elph-Mcnc Not Observed Not Observed g/dL   Globulin, Total 3.6 2.2 - 3.9 g/dL   Albumin/Glob SerPl 1.1 0.7 - 1.7   IFE 1 Comment (A)     Comment: Polyclonal increase detected in one or more immunoglobulins.   Please Note Comment     Comment: (NOTE) Protein electrophoresis scan will follow via computer, mail, or courier delivery. Performed At: [facility] [address] [place], [place] [id number] Jolene Schimke MD QM:[id number]   Marthe Patch, 24-HR UR     Status: None   Collection Time:  02/16/23  8:00 AM  Result Value Ref Range   Total Protein, Urine 7.0 Not Estab. mg/dL   Total Protein, Urine-Ur/day 56 30 - 150 mg/24 hr   Albumin, U 100.0 %   ALPHA 1 URINE 0.0 %   Alpha 2, Urine 0.0 %   % BETA, Urine 0.0 %   GAMMA GLOBULIN URINE 0.0 %   M-SPIKE %, Urine Not Observed Not Observed %   Immunofixation Result, Urine Comment     Comment: (NOTE) The immunofixation pattern appears unremarkable. Evidence of monoclonal protein is not apparent.    Note: Comment     Comment: (NOTE) Protein electrophoresis scan will follow via computer, mail, or courier delivery. Performed At: [facility] [address] [place], [place] [id number] Jolene Schimke MD NU:[id number]    Total Volume 800     Comment: Performed at [facility], [address] [place], [place] [address]  POCT urinalysis dipstick     Status: Abnormal   Collection Time: 03/21/23 10:21 AM  Result Value Ref Range   Color, UA Yellow    Clarity, UA Cloudy    Glucose, UA Negative Negative   Bilirubin, UA Negative    Ketones, UA Negative    Spec Grav, UA 1.020 1.010 - 1.025   Blood, UA Large    pH, UA 6.5 5.0 - 8.0   Protein, UA Positive (A) Negative   Urobilinogen, UA 0.2 0.2 or 1.0 E.U./dL   Nitrite, UA Positive    Leukocytes, UA Large (3+) (A) Negative   Appearance Yellow    Odor Foul   Urine Culture     Status: Abnormal   Collection Time: 03/21/23 11:39 AM   Specimen: Urine  Result Value Ref Range   MICRO NUMBER: [id number]    SPECIMEN QUALITY: Adequate    Sample Source URINE    STATUS: FINAL    ISOLATE 1: Escherichia coli (A)     Comment: Greater than 100,000 CFU/mL of Escherichia coli      Susceptibility   Escherichia coli - URINE CULTURE, REFLEX    AMOX/CLAVULANIC <=2 Sensitive     AMPICILLIN <=2 Sensitive     AMPICILLIN/SULBACTAM <=2 Sensitive     CEFAZOLIN* <=4 Not Reportable      * For infections other than uncomplicated UTI caused by E. coli, K. pneumoniae or P.  mirabilis: Cefazolin is resistant if MIC > or = 8 mcg/mL. (Distinguishing susceptible versus intermediate for isolates with MIC < or = 4 mcg/mL requires additional testing.) For uncomplicated UTI caused by E. coli, K. pneumoniae or P. mirabilis: Cefazolin is susceptible if MIC <32 mcg/mL and predicts susceptible to the oral agents cefaclor,  cefdinir, cefpodoxime, cefprozil, cefuroxime, cephalexin and loracarbef.     CEFTAZIDIME <=1 Sensitive     CEFEPIME <=1 Sensitive     CEFTRIAXONE <=1 Sensitive     CIPROFLOXACIN <=0.25 Sensitive     LEVOFLOXACIN <=0.12 Sensitive     GENTAMICIN <=1 Sensitive     IMIPENEM <=0.25 Sensitive     NITROFURANTOIN <=16 Sensitive     PIP/TAZO <=4 Sensitive     TOBRAMYCIN <=1 Sensitive     TRIMETH/SULFA* <=20 Sensitive      * For infections other than uncomplicated UTI caused by E. coli, K. pneumoniae or P. mirabilis: Cefazolin is resistant if MIC > or = 8 mcg/mL. (Distinguishing susceptible versus intermediate for isolates with MIC < or = 4 mcg/mL requires additional testing.) For uncomplicated UTI caused by E. coli, K. pneumoniae or P. mirabilis: Cefazolin is susceptible if MIC <32 mcg/mL and predicts susceptible to the oral agents cefaclor, cefdinir, cefpodoxime, cefprozil, cefuroxime, cephalexin and loracarbef. Legend: S = Susceptible  I = Intermediate R = Resistant  NS = Not susceptible * = Not tested  NR = Not reported **NN = See antimicrobic comments   Urinalysis, Routine w reflex microscopic -Urine, Clean Catch     Status: Abnormal   Collection Time: 04/07/23  5:11 AM  Result Value Ref Range   Color, Urine AMBER (A) YELLOW    Comment: BIOCHEMICALS MAY BE AFFECTED BY COLOR   APPearance TURBID (A) CLEAR   Specific Gravity, Urine 1.016 1.005 - 1.030   pH 5.0 5.0 - 8.0   Glucose, UA NEGATIVE NEGATIVE mg/dL   Hgb urine dipstick SMALL (A) NEGATIVE   Bilirubin Urine NEGATIVE NEGATIVE   Ketones, ur NEGATIVE NEGATIVE mg/dL   Protein, ur  409 (A) NEGATIVE mg/dL   Nitrite NEGATIVE NEGATIVE   Leukocytes,Ua LARGE (A) NEGATIVE   RBC / HPF 6-10 0 - 5 RBC/hpf   WBC, UA >50 0 - 5 WBC/hpf   Bacteria, UA RARE (A) NONE SEEN   Squamous Epithelial / HPF 0-5 0 - 5 /HPF   WBC Clumps PRESENT    Mucus PRESENT     Comment: Performed at [facility], [address], [place], [place] [address]  Urine Culture     Status: Abnormal   Collection Time: 04/07/23  5:11 AM   Specimen: Urine, Clean Catch  Result Value Ref Range   Specimen Description      URINE, CLEAN CATCH Performed at [facility], [address] [place], [place] [address]    Special Requests      NONE Performed at [facility], [address] [place], [place] [address]    Culture >=100,000 COLONIES/mL ESCHERICHIA COLI (A)    Report Status 04/09/2023 FINAL    Organism ID, Bacteria ESCHERICHIA COLI (A)       Susceptibility   Escherichia coli - MIC*    AMPICILLIN <=2 SENSITIVE Sensitive     CEFAZOLIN <=4 SENSITIVE Sensitive     CEFEPIME <=0.12 SENSITIVE Sensitive     CEFTRIAXONE <=0.25 SENSITIVE Sensitive     CIPROFLOXACIN <=0.25 SENSITIVE Sensitive     GENTAMICIN <=1 SENSITIVE Sensitive     IMIPENEM <=0.25 SENSITIVE Sensitive     NITROFURANTOIN <=16 SENSITIVE Sensitive     TRIMETH/SULFA <=20 SENSITIVE Sensitive     AMPICILLIN/SULBACTAM <=2 SENSITIVE Sensitive     PIP/TAZO <=4 SENSITIVE Sensitive     * >=100,000 COLONIES/mL ESCHERICHIA COLI  CBC with Differential     Status: Abnormal   Collection Time: 04/07/23  5:11 AM  Result Value Ref Range   WBC 6.7 4.0 - 10.5 K/uL   RBC 4.63 3.87 - 5.11 MIL/uL   Hemoglobin 13.0 12.0 - 15.0 g/dL   HCT 74.2 59.5 - 63.8 %   MCV 86.6 80.0 - 100.0 fL   MCH 28.1 26.0 - 34.0 pg   MCHC 32.4 30.0 - 36.0 g/dL   RDW 75.6 43.3 - 29.5 %   Platelets 249 150 - 400 K/uL   nRBC 0.0 0.0 - 0.2 %   Neutrophils Relative % 66 %   Neutro Abs 4.4 1.7 - 7.7 K/uL   Lymphocytes Relative 13 %   Lymphs Abs 0.9 0.7 -  4.0 K/uL   Monocytes Relative 19 %   Monocytes Absolute 1.3 (H) 0.1 - 1.0 K/uL   Eosinophils Relative 1 %   Eosinophils Absolute 0.1 0.0 - 0.5 K/uL   Basophils Relative 1 %   Basophils Absolute 0.1 0.0 - 0.1 K/uL   Immature Granulocytes 0 %   Abs Immature Granulocytes 0.02 0.00 - 0.07 K/uL    Comment: Performed at [facility], [address] [place], [place] [address]  Comprehensive metabolic panel     Status: Abnormal   Collection Time: 04/07/23  5:11 AM  Result Value Ref Range   Sodium 136 135 - 145 mmol/L   Potassium 3.6 3.5 - 5.1 mmol/L   Chloride 101 98 - 111 mmol/L   CO2 27 22 - 32 mmol/L   Glucose, Bld 102 (H) 70 - 99 mg/dL    Comment: Glucose reference range applies only to samples taken after fasting for at least 8 hours.   BUN 11 8 - 23 mg/dL   Creatinine, Ser 6.60 0.44 - 1.00 mg/dL   Calcium 8.8 (L) 8.9 - 10.3 mg/dL   Total Protein 7.8 6.5 - 8.1 g/dL   Albumin 3.5 3.5 - 5.0 g/dL   AST 19 15 - 41 U/L   ALT 17 0 - 44 U/L   Alkaline Phosphatase 61 38 - 126 U/L   Total Bilirubin 1.1 0.3 - 1.2 mg/dL   GFR, Estimated >63 >01 mL/min    Comment: (NOTE) Calculated using the CKD-EPI Creatinine Equation (2021)    Anion gap 8 5 - 15    Comment: Performed at [facility], [address] [place], [place] [address]  Lactic acid, plasma     Status: None   Collection Time: 04/07/23  5:11 AM  Result Value Ref Range   Lactic Acid, Venous 0.9 0.5 - 1.9 mmol/L    Comment: Performed at [facility], [address] [place], [place] [address]  Blood culture (single)     Status: Abnormal   Collection Time: 04/07/23  5:11 AM   Specimen: BLOOD  Result Value Ref Range   Specimen Description      BLOOD HAND Performed at [facility], [address], [place], [place] [address]    Special Requests      BOTTLES DRAWN AEROBIC AND ANAEROBIC Blood Culture adequate volume Performed at [facility], [address] [place], [place]  [address]    Culture  Setup Time      GRAM NEGATIVE RODS ANAEROBIC BOTTLE ONLY CRITICAL RESULT CALLED TO, READ BACK BY AND VERIFIED WITH: JACKLYN FERGUSON AT 1730 ON 04/07/23 BY SS    Culture ESCHERICHIA COLI (A)    Report Status 04/09/2023 FINAL    Organism ID, Bacteria ESCHERICHIA COLI       Susceptibility  Escherichia coli - MIC*    AMPICILLIN <=2 SENSITIVE Sensitive     CEFEPIME <=0.12 SENSITIVE Sensitive     CEFTAZIDIME <=1 SENSITIVE Sensitive     CEFTRIAXONE <=0.25 SENSITIVE Sensitive     CIPROFLOXACIN <=0.25 SENSITIVE Sensitive     GENTAMICIN <=1 SENSITIVE Sensitive     IMIPENEM <=0.25 SENSITIVE Sensitive     TRIMETH/SULFA <=20 SENSITIVE Sensitive     AMPICILLIN/SULBACTAM <=2 SENSITIVE Sensitive     PIP/TAZO <=4 SENSITIVE Sensitive     * ESCHERICHIA COLI  Blood Culture ID Panel (Reflexed)     Status: Abnormal   Collection Time: 04/07/23  5:11 AM  Result Value Ref Range   Enterococcus faecalis NOT DETECTED NOT DETECTED   Enterococcus Faecium NOT DETECTED NOT DETECTED   Listeria monocytogenes NOT DETECTED NOT DETECTED   Staphylococcus species NOT DETECTED NOT DETECTED   Staphylococcus aureus (BCID) NOT DETECTED NOT DETECTED   Staphylococcus epidermidis NOT DETECTED NOT DETECTED   Staphylococcus lugdunensis NOT DETECTED NOT DETECTED   Streptococcus species NOT DETECTED NOT DETECTED   Streptococcus agalactiae NOT DETECTED NOT DETECTED   Streptococcus pneumoniae NOT DETECTED NOT DETECTED   Streptococcus pyogenes NOT DETECTED NOT DETECTED   A.calcoaceticus-baumannii NOT DETECTED NOT DETECTED   Bacteroides fragilis NOT DETECTED NOT DETECTED   Enterobacterales DETECTED (A) NOT DETECTED    Comment: Enterobacterales represent a large order of gram negative bacteria, not a single organism. CRITICAL RESULT CALLED TO, READ BACK BY AND VERIFIED WITH: JACKLYN FERGUSON AT 1730 ON 04/07/23 BY SS    Enterobacter cloacae complex NOT DETECTED NOT DETECTED   Escherichia coli DETECTED (A) NOT  DETECTED    Comment: CRITICAL RESULT CALLED TO, READ BACK BY AND VERIFIED WITH: JACKLYN FERGUSON AT 1730 ON 04/07/23 BY SS    Klebsiella aerogenes NOT DETECTED NOT DETECTED   Klebsiella oxytoca NOT DETECTED NOT DETECTED   Klebsiella pneumoniae NOT DETECTED NOT DETECTED   Proteus species NOT DETECTED NOT DETECTED   Salmonella species NOT DETECTED NOT DETECTED   Serratia marcescens NOT DETECTED NOT DETECTED   Haemophilus influenzae NOT DETECTED NOT DETECTED   Neisseria meningitidis NOT DETECTED NOT DETECTED   Pseudomonas aeruginosa NOT DETECTED NOT DETECTED   Stenotrophomonas maltophilia NOT DETECTED NOT DETECTED   Candida albicans NOT DETECTED NOT DETECTED   Candida auris NOT DETECTED NOT DETECTED   Candida glabrata NOT DETECTED NOT DETECTED   Candida krusei NOT DETECTED NOT DETECTED   Candida parapsilosis NOT DETECTED NOT DETECTED   Candida tropicalis NOT DETECTED NOT DETECTED   Cryptococcus neoformans/gattii NOT DETECTED NOT DETECTED   CTX-M ESBL NOT DETECTED NOT DETECTED   Carbapenem resistance IMP NOT DETECTED NOT DETECTED   Carbapenem resistance KPC NOT DETECTED NOT DETECTED   Carbapenem resistance NDM NOT DETECTED NOT DETECTED   Carbapenem resist OXA 48 LIKE NOT DETECTED NOT DETECTED   Carbapenem resistance VIM NOT DETECTED NOT DETECTED    Comment: Performed at [facility], [address] [place] [place], [place] [address]      PHQ2/9:    04/10/2023    2:33 PM 03/21/2023   10:10 AM 03/01/2023    7:47 AM 01/13/2023   11:15 AM 01/10/2023    8:45 AM  Depression screen PHQ 2/9  Decreased Interest 1 0 1 1 1   Down, Depressed, Hopeless 0 1 1 1  0  PHQ - 2 Score 1 1 2 2 1   Altered sleeping 1 1 1   0  Tired, decreased energy 1 1 1  2   Change in appetite  2 1 1  1   Feeling bad or failure about yourself  0 0 0  1  Trouble concentrating 0 1 1  2   Moving slowly or fidgety/restless 0 0 0  1  Suicidal thoughts 0 0 0  0  PHQ-9 Score 5 5 6  8   Difficult doing work/chores Somewhat  difficult Somewhat difficult   Somewhat difficult    phq 9 is positive   Fall Risk:    04/10/2023    2:32 PM 03/21/2023   10:10 AM 03/01/2023    7:47 AM 01/10/2023    8:35 AM 10/24/2022    1:53 PM  Fall Risk   Falls in the past year? 0 0 0 0 0  Number falls in past yr:  0 0    Injury with Fall?  0 0    Risk for fall due to : No Fall Risks No Fall Risks No Fall Risks No Fall Risks No Fall Risks  Follow up Falls prevention discussed Falls prevention discussed;Education provided;Falls evaluation completed Falls prevention discussed Falls prevention discussed;Education provided;Falls evaluation completed Falls prevention discussed     Assessment & Plan  1. E. coli UTI  - Urine Culture - BASIC METABOLIC PANEL WITH GFR  2. Positive blood culture  - BASIC METABOLIC PANEL WITH GFR

## 2023-04-11 ENCOUNTER — Ambulatory Visit: Payer: 59 | Admitting: Family Medicine

## 2023-04-12 ENCOUNTER — Telehealth: Payer: Self-pay | Admitting: *Deleted

## 2023-04-12 ENCOUNTER — Encounter: Payer: Self-pay | Admitting: Family Medicine

## 2023-04-12 NOTE — Telephone Encounter (Signed)
Patient called to ask if she was supposed to have a follow up appointment. It was her understanding that at her last visit she would be scheduled.

## 2023-04-12 NOTE — Telephone Encounter (Signed)
called and spoke to pt. Informed her that since her work up was negative in June, Dr. Cathie Hoops recommneded to follow up PRN. She did not need any follow up appts unless she had any new issues. Pt verbalized understanding.

## 2023-04-12 NOTE — Telephone Encounter (Signed)
Per last LOS in June 2024, follow up PRN. Please advise if follow is needed.

## 2023-04-14 ENCOUNTER — Telehealth: Payer: Self-pay

## 2023-04-14 NOTE — Telephone Encounter (Signed)
Transition Care Management Follow-up Telephone Call Date of discharge and from where: [facility] 8/2 How have you been since you were released from the hospital? Doing good Any questions or concerns? No  Items Reviewed: Did the pt receive and understand the discharge instructions provided? Yes  Medications obtained and verified? Yes  Other? No  Any new allergies since your discharge? No  Dietary orders reviewed? No Do you have support at home? No    Follow up appointments reviewed:  PCP Hospital f/u appt confirmed? Yes  Scheduled to see PCP on 8/5 @ . Specialist Hospital f/u appt confirmed? No  Scheduled to see  on  @ . Are transportation arrangements needed? No  If their condition worsens, is the pt aware to call PCP or go to the Emergency Dept.? Yes Was the patient provided with contact information for the PCP's office or ED? Yes Was to pt encouraged to call back with questions or concerns? Yes

## 2023-04-26 DIAGNOSIS — K5904 Chronic idiopathic constipation: Secondary | ICD-10-CM | POA: Diagnosis not present

## 2023-04-26 DIAGNOSIS — M6289 Other specified disorders of muscle: Secondary | ICD-10-CM | POA: Diagnosis not present

## 2023-04-26 DIAGNOSIS — R52 Pain, unspecified: Secondary | ICD-10-CM | POA: Diagnosis not present

## 2023-04-26 NOTE — Unmapped (Signed)
This was a telehealth service and I was in person with the resident.  As the attending physician, I spent 4 minutes in medical discussion with the patient via phone. I agree with the resident's findings and plan.     Zelphia Cairo, MD

## 2023-04-26 NOTE — Unmapped (Signed)
[facility] [address] [place]  OUTPATIENT PHYSICAL THERAPY  04/26/2023          Patient Name: Caitlin Smith  Date of Birth:[birth year]  Diagnosis:   Encounter Diagnoses   Name Primary?    Pelvic floor dysfunction in female Yes    Chronic idiopathic constipation      Referring MD:  Corinda Gubler, *     Plan of Care Effective Date: 02/28/2023 - 05/29/2023  04/26/23        Assessment  Assessment details:    67 year old female presents in the clinic with worsening chronic constipation with pelvic floor dysfunction.  Patient with elevated resting tone of the pelvic floor muscles with decreased ability to relax her pelvic floor muscles appropriately. Patient with good compliance with her HEP. Patient with marked increase of urinary urgency and incontinence this past week. Patient will likely benefit from skilled physical therapy to improve her ability to have a bowel movement at least every other day without straining through manual therapy, therapeutic exercise, patient education such as learning toilet posture and behavioral techniques to improve her pelvic floor function as well as a home exercise program.          Impairments: bowel dysfunction, pain and urinary incontinence                Prognosis: good prognosis    Positive Prognosis Rationale: motivated for treatment.  Negative Prognosis Rationale: chronicity of condition and severity of symptoms.    Barriers to therapy: transportation    Therapy Goals      Goals:      Short Term Goal - 4 weeks  met 04-26-23  1. Patient independent with HEP to learn improved toilet postures for effectively emptying her bowels.      Long Term Goals - 16 weeks  1.  Patient able to have a bowel movement every other day without straining.  2.  Patient able to sneeze without leaking urine.  3.  Patient able to feel that she is completely emptying her bowels.    Plan    Therapy options: will be seen for skilled physical therapy services    Planned therapy interventions: 97110-Therapeutic Exercises, 97112-Neuromuscular Re-education, 97140-Manual Therapy, 97530-Therapeutic Activities and 97535-Self-Care/Home Training      Frequency: 1x week    Duration in weeks: 16    Education provided to: patient.    Education provided: Teacher, music, HEP, Toileting posture and Self-soft tissue mobilization    Education results: needs reinforcement.      Next visit plan:        Biofeedback?    Total Session Time: 45    Treatment rendered today:      Self Care 15'  Discussed patient instructions from last GI visit with patient including asking about whether she had done the colon clean out.  Patient stated she had forgotten those instructions.  Reviewed the recommendations and printed out the patient instructions from the visit with GI in April.  Patient stated she will do the clean out in the next few days.  Reviewed core breathing and pelvic floor relaxation techniques.    Manual Therapy 25'  STM for I, L, U massage with flat hand and circles as well as medium cup with medium suction with use of deep tissue massage lotion.        5 minutes of appointment was wallking in and out of treatment room and confirming next appointment.  History of Present Condition     History of Present Condition/Chief Complaint:  Chronic constipation, pelvic floor dysfunction  Subjective:  Patient stated she feels frustrated because she is still only having small amounts of stool come out.  She is using the Miralax, taking the Linzess, putting flax seed in her smoothies, using enemas, and drinking lots of water.  She looked at the results of her colonoscopy and saw that she had a precancerous polyp which was concerning to her. Patient has an appointment today with neuro PT to work on her balance.     Quality of life:  Fair  Pain:     Current pain rating:  7    At best pain rating:  6    At worst pain rating:  10  Pain Comments: Chronic pain - had a knee replacement on the right.  Did not heal well. Patient stated she has a lot of pain in the left knee.  Patient chose not to replace the left knee at this time.  Patient with fibromyalgia and has lung disease so has to walk.      Quality:  Aching and sore    Physical limitation(s):  Knee pain  Current Functional Status:  Limited walking tolerance  Social Support:     Lives Environment:  Apartment and stairs (one flight)    Lives with:  Alone    Work/School:  Walks in the morning for 45-60 minutes to improve her lung disease  Treatments:     Current treatment: medication    Patient Goals:     Patient/Family goals for therapy:  Decrease/Eliminate UI and improve bowel function      OB/GYN History:  History of Pregnancy?: Yes  Pregnancy Type: Gravida, Para, Vaginal Deliveries  # of Gravida: 2   # of Para : 2  # of Vaginal Deliveries: 2        Sexual History:  Sexual Dysfunction: No (patient hasn't been sexually active, but woudl like to be,  She stated she did not have any sensation.)            Urinary Symptoms:  Fluid Intake: water - 40 oz, tea,  # of voids : 3, 4, per day       # of voids per night: per night, 2  Urinary urgency?: Yes  Urinary incontinence?: Yes       Urinary Incontinence types : SUI (sneeze or cough)                     Bowel Symptoms:  # of BMs: 0, 1, 2, per week (two capfuls a day of miralax but was making her sick.  Takes Linzess daily.  If takes a pill, it takes two to three days to work.)      Stool Type (based on [place] Stool Chart): V      Straining?: Yes  Fecal urgency?: No      Fecal incontinence?: No                  Postural Observations  Seated posture: fair  Standing posture: fair      Ambulation   Observational Gait   Gait: antalgic   Decreased walking speed and stride length.     Pelvic Floor  Not tested 04-26-23 - below information from previous visit.    Verbal consent given from the patient before start of pelvic floor exam including single digit penetration into the vaginal opening.    Observation  Skin WNL  Visible Contraction minimal  Visible Relaxation minimal, delayed    Palpation  Patient with mild increased resting tone grossly of the bilateral levator ani without complaints of tenderness to palpation.    Pelvic Floor Strength - Laycock Scale  Power - 2  Endurance 3  Repetitions 5  Fast Repetitions not tested due to delayed relaxation following contraction of the pelvic floor.  Patient with initial contraction of the pelvic floor following cue to relax pelvic floor.  Patient able to slowly relax after several seconds.       I attest that I have reviewed the above information.  Signed: Gaylyn Lambert, PT  04/26/2023 8:15 AM      All information regarding the expectations of participation in pelvic floor physical therapy, including but not limited to, the need for internal and/or external pelvic floor assessment, treatment techniques and plan of care were thoroughly discussed.  Patient confirms understanding that (s)he may bring a third party to any or all physical therapy sessions and can withdraw consent at any point throughout the plan of care.  Patient and guardian (if applicable) verbalize understanding of all above information and consent to pelvic floor assessment and treatment at evaluation and all future physical therapy sessions.

## 2023-04-26 NOTE — Unmapped (Addendum)
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 Loraine Leriche the highest category that applies.  (3) Normal--Is able to ambulate for 10 steps heel to toe with no staggering.  (2) Mild impairment--Ambulates 7-9 steps.  (1) Moderate impairment--Ambulates 4-7 steps.  (0) Severe impairment--Ambulates less than 4 steps heel to toe or cannot perform without assistance.    8. GAIT WITH EYES CLOSED 1  Instructions: Walk at your normal speed from here to the next mark (6 m[20 ft]) with your eyes closed.  Grading: Loraine Leriche the highest category that applies.  (3) Normal--Walks 6 m (20 ft), no assistive devices, good speed, no evidence of imbalance, normal gait pattern, deviates no more  than 15.24 cm (6 in) outside 30.48-cm (12-in) walkway width. Ambulates 6 m (20 ft) in less than 7 seconds.  (2) Mild impairment--Walks 6 m (20 ft), uses assistive device, slower speed, mild gait deviations, deviates 15.24-25.4 cm  (6-10 in) outside 30.48-cm (12-in) walkway width. Ambulates 6 m (20 ft) in less than 9 seconds but greater than 7 seconds.  (1) Moderate impairment--Walks 6 m (20 ft), slow speed, abnormal gait pattern, evidence for imbalance, deviates 25.4-38.1  cm (10-15 in) outside 30.48-cm (12-in) walkway width. Requires more than 9 seconds to ambulate 6 m (20 ft).  (0) Severe impairment--Cannot walk 6 m (20 ft) without assistance, severe gait deviations or imbalance, deviates greater than 38.1  cm (15 in) outside 30.48-cm (12-in) walkway width or will not attempt task.    9. AMBULATING BACKWARDS 2  Instructions: Walk backwards until I tell you to stop.  Grading: Loraine Leriche the highest category that applies.  (3) Normal--Walks 6 m (20 ft), no assistive  devices, good speed, no evidence for imbalance, normal gait pattern, deviates no  more than 15.24 cm (6 in) outside 30.48-cm (12-in) walkway width.  (2) Mild impairment--Walks 6 m (20 ft), uses assistive device, slower speed, mild gait deviations, deviates 15.24-25.4 cm (6-10 in) outside 30.48-cm (12-in) walkway width.  (1) Moderate impairment--Walks 6 m (20 ft), slow speed, abnormal gait pattern, evidence for imbalance, deviates 25.4-38.1  cm (10-15 in) outside 30.48-cm (12-in) walkway width.  (0) Severe impairment--Cannot walk 6 m (20 ft) without assistance, severe gait deviations or imbalance, deviates greater than 38.1  cm (15 in) outside 30.48-cm (12-in) walkway width or will not attempt task.    10. STEPS 2  Instructions: Walk up these stairs as you would at home (ie, using the rail if necessary). At the top turn around and walk down.  Grading: Loraine Leriche the highest category that applies.  (3) Normal--Alternating feet, no rail.  (2) Mild impairment--Alternating feet, must use rail.  (1) Moderate impairment--Two feet to a stair; must use rail.  (0) Severe impairment--Cannot do safely.    TOTAL SCORE: 14    MAXIMUM SCORE 30      Physical Therapy . Volume 84 . Number 10 . October 2004 Wrisley et al . 917     Gait Analysis: Pt tends to walk close to wall in an unconscious attempt to find external support for dynamic balance.  Walks with inconsistent step width, often to prevent left LOB    Education: I reviewed the no-show/attendance policy with the patient and caregiver(s). The family is aware that they must call to cancel appointments more than 24 hours in advance. They are also aware that if they late cancel or no-show three times, we reserve the right to cancel their remaining appointments. This policy is in place to allow [place] to best serve the needs of our caseload.     Home Exercise Program: next session    Treatment Rendered:    PT Evaluation: 45 min     I attest that I have reviewed the above information.  Signed: Cabela Pacifico C. Ethelene Browns, SPT  04/26/2023 8:51 AM    I was physically present and immediately available to direct and supervise tasks that were related to patient management. The direction and supervision was continuous throughout the time these tasks were performed.     Rockwell Germany Matrunick, PT, DPT  04/26/2023, 11:08 AM

## 2023-05-02 ENCOUNTER — Telehealth: Admit: 2023-05-02 | Discharge: 2023-05-03 | Payer: MEDICARE | Attending: Psychologist | Primary: Psychologist

## 2023-05-02 DIAGNOSIS — F410 Panic disorder [episodic paroxysmal anxiety] without agoraphobia: Principal | ICD-10-CM

## 2023-05-02 DIAGNOSIS — F4310 Post-traumatic stress disorder, unspecified: Principal | ICD-10-CM

## 2023-05-02 DIAGNOSIS — F411 Generalized anxiety disorder: Principal | ICD-10-CM

## 2023-05-02 DIAGNOSIS — F331 Major depressive disorder, recurrent, moderate: Principal | ICD-10-CM

## 2023-05-02 NOTE — Unmapped (Signed)
[facility]   Confidential Psychological Therapy Session      Patient Name: Caitlin Smith  Medical Record Number: [id number]  Date of Service: May 02, 2023  Attending Psychologist: Caroline More, PhD  CPT Procedure Codes: 82956 for 45 mins of face to face counseling    This visit was performed face to face with interactive technology using a HIPPA compliant audio/visual platform. We reviewed confidentiality today. The patient was present in [place], a state in which this provider is licensed and able to provide care (location and contact information confirmed), attended this visit alone, and consented to this virtual pain psychology visit.    REFERRING PHYSICIAN: Clarene Essex, MD    CHIEF COMPLAINT AND REASON FOR VISIT: pain coping skills, CBT to address depression and anxiety in the setting of chronic pain    SUBJECTIVE / HISTORY OF PRESENT ILLNESS: Ms.  Caitlin Smith is a very pleasant 67 y.o.  female from [place], [place] with multiple chronic pain complaints related to fibromyalgia and lupus who initially met with me in October 2016, at which time she was diagnosed with severe depression, PTSD, panic disorder, and generalized anxiety. The patient returns for a therapy session today. Last follow up with me was in 03/2023.    The patient participates actively today.  Pt shares concerns related to weight loss and Gi testing. Reviewed active communication and accomplishments. Pt's mood and anxiety improving. Walking daily. BMI appropriate (discussed weight loss does not look problematic, will continue to follow). Plans to discuss colonoscopy results with Gi specialist. May follow Pulm specialist to [facility]. Spent extra time retouching on cognitive reframing and hightlighting active coping.     OBJECTIVE / MENTAL STATUS:    Appearance:   Appears stated age and Clean/Neat   Motor:  No abnormal movements   Speech/Language:   Normal rate, volume, tone, fluency   Mood:  Depressed and Anxious   Affect:  euthymic   Thought process:  Logical, linear, clear, coherent, goal directed   Thought content:    Denies SI, HI, self harm, delusions, obsessions, paranoid ideation, or ideas of reference   Perceptual disturbances:    Denies auditory and visual hallucinations, behavior not concerning for response to internal stimuli   Orientation:  Oriented to person, place, time, and general circumstances   Attention:  Able to fully attend without fluctuations in consciousness   Concentration:  Able to fully concentrate and attend   Memory:  Immediate, short-term, long-term, and recall grossly intact    Fund of knowledge:   Consistent with level of education and development   Insight:    Fair   Judgment:   Intact   Impulse Control:  Intact     DIAGNOSTIC IMPRESSION:   Post Traumatic Stress Disorder (PTSD)  Generalized Anxiety Disorder (GAD)  Panic Disorder  Major Depressive Disorder, moderate, recurrent  Chronic pain syndrome  Fibromyalgia  Lupus    ASSESSMENT:   Ms.  Caitlin Smith is a very pleasant 67 y.o.  female from [place], [place] with multiple chronic pain complaints related to lupus, arthritis, and fibromyalgia. She was previously seen in our clinic from 2012-2014, and reestablished care with Dr. Fayrene Fearing in August 2016. The patient has struggled with depression and anxiety, but is very motivated to participate in intensive, multidisciplinary treatment to address the connection between depression, anxiety and pain. She has a long-standing history of depression and anxiety, and has worked with outpatient psychiatrist and therapist over the years. She is currently established with [facility]  psychiatry.  In general, depression, anxiety, pain coping and panic have all improved (until recent bout with pulmonary issues), but the patient evidences a seasonal affective pattern of mood changes typically her mood tends to be worse during the winter.  Encouraged early morning sunlight and getting outside and walking as tolerated, building cardiovascular and muscular strength and endurance over time. Also encouraged practice of daily behavioral relaxation exercises as well.     Most recently has had neurological symptoms, and saw her neurologist, has a brain MRI scheduled for tomorrow.     PLAN:   (1) Psychotherapy - Continue CBT. CBT will be used to address PTSD, MDD, Panic D/o, GAD, and chronic pain.     --Behavioral Activation - Encouraged behavioral activation.  Is trying. Walking in AM.  --Downloaded and uses Insight Timer, guided relaxation app, for nightly practice.  --Continues to utilize diaphragmatic breathing daily  --encouraged early morning sunlight / light therapy for possible SAD in the setting of chronic MDD    (2) Psychiatry - Pt currently established with [facility].  Is followed by Dr. Evette Cristal (attending) but typically has appointments with a psychiatrist resident, Dr. Virl Cagey.    (3) Safety - Pt denies any current or recent SI or safety concerns. Knows to call 911 or go to her local ED. Also previously given [facility].

## 2023-05-04 NOTE — Unmapped (Signed)
[facility]  [address] [place], [place] [address]    [phone]    [name] cancelled her  scheduled Physical Therapy follow-up session for today.  Please contact me if you have any questions or concerns.     Thank you for this referral,     Signed: Gaylyn Lambert, PT  05/04/2023 8:22 AM

## 2023-05-09 ENCOUNTER — Ambulatory Visit: Admit: 2023-05-09 | Discharge: 2023-05-10 | Payer: MEDICARE | Attending: Medical | Primary: Medical

## 2023-05-09 DIAGNOSIS — K5904 Chronic idiopathic constipation: Secondary | ICD-10-CM | POA: Diagnosis not present
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
## 2023-05-10 ENCOUNTER — Telehealth
Admit: 2023-05-10 | Discharge: 2023-05-11 | Payer: MEDICARE | Attending: Student in an Organized Health Care Education/Training Program | Primary: Student in an Organized Health Care Education/Training Program

## 2023-05-10 MED ORDER — CLONAZEPAM 0.5 MG TABLET
ORAL | 1 refills | 20 days | Status: CP | PRN
Start: 2023-05-10 — End: ?

## 2023-05-10 NOTE — Unmapped (Signed)
[facility]  Psychiatry   Established Patient E&M Service - Outpatient     Assessment:  Caitlin Smith has been stable. She reports that her insurance has been adjusted correctly. She has no major concerns. Looking forward to spending time with her family. Reviewed medications. Patient initially believed that melatonin was causing weight loss per instruction from PCP. Reviewed chart review and informed patient it was likely metformin.       Plan   [ ]  Plan for follow up in December vs January once Dr. Virl Cagey returns from maternity  [ ]  No medication changes.   [ ]  Patient to discuss if she could stop metformin herself.       Identifying Information:  Caitlin Smith is a 67 y.o. female with a history of  MDD, GAD, and Panic Disorder in the context of many chronic medical conditions including chronic pain/fibromyalgia, osteoarthritis with degenerative disc disease, Lupus, pituitary tumor (benign), s/p right total knee arthroplasty, and VIN II (s/p resection), who presents for follow-up evaluation. She has been maintained on Lamictal for many years as well as cymbalta, trazodone, klonopin, and wellbutrin. Patient has previously tolerated a slight taper in her klonopin from 0.75 mg to 0.25 mg qHS overtime. Her mood is often exacerbated by steroid use for current treatment of pulmonary disease. Patient is engaged with CBT therapy at the pain clinic.    Past medications:  Cymbalta  Effexor     Risk Assessment:  An assessment of suicide and violence risk factors was performed as part of this evaluation and is not significantly changed from the last visit. While future psychiatric events cannot be accurately predicted, the patient does not currently require acute inpatient psychiatric care and does not currently meet [facility] involuntary commitment criteria.      Plan:    Problem: Major depression, Anxiety NOS, Panic Disorder   Status of problem: chronic with mild exacerbation  Interventions:   -- Continue Lamictal 400mg  qhs for mood  -- Continue Clonazepam 0.25mg  at bedtime,   -- Continue CBT at pain clinic    Problem: Insomnia   Status of problem: chronic and stable  Interventions:   Last sleep study performed in 2017. Per chart review, showed mild OSA likely related to medication use. Neurology recommends patient avoid sleeping on back as she likely has OSA when sleeping in this position  -- Continue melatonin 3-5mg  at bedtime   -- Continue trazodone 150mg  qHS for insomnia (d5/4/21, d 02/05/20, i9/8/21,)  -- Clonazepam per above    Problem: High Risk Medication monitoring   Status of problem:  Stable   Interventions:   The complexity of this patient's care involves drug therapy which requires intensive monitoring for toxicity. This is in part due to a narrow therapeutic window, as well as potential for toxicity. This patient is being treated with Lamotrigine (Lamictal) to target their psychiatric disorder, Major depressive disorder, refractory. In regards to this medication being considered high risk, Lamotrigine (lamictal) has black box warning for serious rash including fatal reaction of Stevens-Johnson syndrome and rare cases of toxic epidermal necrolysis. In addition to prolonged titration to mitigate risk of serious rash, lamotrigine requires monitoring of hepatic and renal function at baseline, and at times will require monitoring of lamotrigine blood levels.    Problem: Mild Major Neurocognitive Disorder,   Status of problem:  new problem to this provider  Interventions:  Patient seen by Neurology ([facility]) 05/22/18 who dx pt with mild neurocognitive impairment w/  deficits in inattention and visual-spatial function with suspicion that sleep apnea and polypharmacy may be contributing to her difficulties. Recommended that psychopharm regimen be simplified, including eliminating trazodone and considering changing Wellbutrin to Zoloft or Lexapro for anxiety and eliminating Klonopin when stable. - However, it is very likely that patient's lupus is significantly contributing to cognitive decline, which will limit improvement she will see with reduction in polypharmacy  -- Last MOCA 22 on 12/12/2017  -- Need to repeat MOCA.     Problem: Polypharmacy   Status of problem:  chronic and stable  Interventions:  -- may be contributing to neurocognitive disorder   -- will consider trazodone and klonopin taper     Lamotrigine Monitoring  - Hepatic function testing, platelets (via CBC) q6 months (06/2022)   - AST/ALT WNL     Effexor   --Impaired renal function can decrease clearance of effexor  -- most recent CrCl 89   -- will continue to monitor along with PCP given dx lupus    Psychotherapy provided:  No billable psychotherapy service provided but brief supportive therapy was utilized.    Patient has been given this writer's contact information as well as the [facility] Psychiatry urgent line number. The patient has been instructed to call 911 for emergencies.    Subjective:  Caitlin Smith reports feeling stable overall. She continues to describe weight loss which was very concerning for her. She is not sure what caused this and was very interest in finding possible causes for this. She initially believed that her PCP said that melatonin was causing the weight loss. MD review her medication--and believed the PCP was referring to metformin. She expressed understanding. Patient decided that she would be the one to follow up with the PCO. She did not want to the psychiatry team to be the one to prescribe the mention.         Objective:    Mental Status Exam:    Appearance:    Appears stated age, Well nourished and Clean/Neat   Motor:    tremulous, appropriate eye contact    Speech/Language:    Normal rate, volume, tone, fluency and Language intact, well formed   Mood:   Anxious   Affect:   Anxious, Cooperative and Full   Thought process and Associations:   Logical, linear, clear, coherent, goal directed   Abnormal/psychotic thought content:     Denies SI, HI, self harm, delusions, obsessions, paranoid ideation, or ideas of reference   Perceptual disturbances:     Denies auditory and visual hallucinations, behavior not concerning for response to internal stimuli     Other:   insight/judgement intact      LAMICTAL MONITORING: Hepatic function testing, platelets (via CBC) at no defined frequency, but should do at baseline and can do periodically  Lamictal Level: No results found for: LAMOTRIGINE       Patient was seen and plan of care was discussed with the Attending MD, Bottom who agrees with the above statement and plan.        Ovid Curd MD MPH  PGY4 Psychiatry          The patient reports they are currently: at home. I spent 20 minutes on the real-time audio and video with the patient on the date of service. I spent an additional 10 minutes on pre- and post-visit activities on the date of service.     The patient was physically located in [place] or a state in which I am permitted  to provide care. The patient and/or parent/guardian understood that s/he may incur co-pays and cost sharing, and agreed to the telemedicine visit. The visit was reasonable and appropriate under the circumstances given the patient's presentation at the time.    The patient and/or parent/guardian has been advised of the potential risks and limitations of this mode of treatment (including, but not limited to, the absence of in-person examination) and has agreed to be treated using telemedicine. The patient's/patient's family's questions regarding telemedicine have been answered.     If the visit was completed in an ambulatory setting, the patient and/or parent/guardian has also been advised to contact their provider???s office for worsening conditions, and seek emergency medical treatment and/or call 911 if the patient deems either necessary.

## 2023-05-11 DIAGNOSIS — J8489 Other specified interstitial pulmonary diseases: Principal | ICD-10-CM

## 2023-05-11 NOTE — Unmapped (Signed)
Patient was seen 01/25/23  No follow up scheduled at this time.          Requested Prescriptions     Pending Prescriptions Disp Refills    predniSONE (DELTASONE) 1 MG tablet 60 tablet 0     Sig: Take 2.5 tablets (2.5 mg total) by mouth daily. To start once prednisone taper has been completed (should be on/around 05/25/23)

## 2023-05-18 ENCOUNTER — Ambulatory Visit: Admit: 2023-05-18 | Discharge: 2023-05-19 | Payer: MEDICARE

## 2023-05-18 DIAGNOSIS — M159 Polyosteoarthritis, unspecified: Secondary | ICD-10-CM | POA: Diagnosis not present

## 2023-05-18 DIAGNOSIS — J8410 Pulmonary fibrosis, unspecified: Secondary | ICD-10-CM | POA: Diagnosis not present

## 2023-05-18 DIAGNOSIS — E611 Iron deficiency: Secondary | ICD-10-CM | POA: Diagnosis not present

## 2023-05-18 DIAGNOSIS — N181 Chronic kidney disease, stage 1: Secondary | ICD-10-CM | POA: Diagnosis not present

## 2023-05-18 DIAGNOSIS — M3219 Other organ or system involvement in systemic lupus erythematosus: Secondary | ICD-10-CM | POA: Diagnosis not present

## 2023-05-18 DIAGNOSIS — Z23 Encounter for immunization: Secondary | ICD-10-CM | POA: Diagnosis not present

## 2023-05-18 DIAGNOSIS — G629 Polyneuropathy, unspecified: Secondary | ICD-10-CM | POA: Diagnosis not present

## 2023-05-18 DIAGNOSIS — Z79899 Other long term (current) drug therapy: Secondary | ICD-10-CM | POA: Diagnosis not present

## 2023-05-18 DIAGNOSIS — M329 Systemic lupus erythematosus, unspecified: Secondary | ICD-10-CM | POA: Diagnosis not present

## 2023-05-18 DIAGNOSIS — I129 Hypertensive chronic kidney disease with stage 1 through stage 4 chronic kidney disease, or unspecified chronic kidney disease: Secondary | ICD-10-CM | POA: Diagnosis not present

## 2023-05-18 DIAGNOSIS — Z6830 Body mass index (BMI) 30.0-30.9, adult: Principal | ICD-10-CM

## 2023-05-18 LAB — URINALYSIS WITH MICROSCOPY WITH CULTURE REFLEX PERFORMABLE
BACTERIA: NONE SEEN /HPF
BILIRUBIN UA: NEGATIVE
BLOOD UA: NEGATIVE
GLUCOSE UA: NEGATIVE
KETONES UA: NEGATIVE
LEUKOCYTE ESTERASE UA: NEGATIVE
NITRITE UA: NEGATIVE
PH UA: 7 (ref 5.0–9.0)
RBC UA: <1 /HPF (ref <=4)
SPECIFIC GRAVITY UA: 1.018 (ref 1.003–1.030)
SQUAMOUS EPITHELIAL: <1 /HPF (ref 0–5)
UROBILINOGEN UA: <2
WBC UA: <1 /HPF (ref 0–5)

## 2023-05-18 LAB — CBC W/ AUTO DIFF
BASOPHILS ABSOLUTE COUNT: 0 10*9/L (ref 0.0–0.1)
BASOPHILS RELATIVE PERCENT: 0.5 %
EOSINOPHILS ABSOLUTE COUNT: 0.1 10*9/L (ref 0.0–0.5)
EOSINOPHILS RELATIVE PERCENT: 3.1 %
HEMATOCRIT: 40.9 % (ref 34.0–44.0)
HEMOGLOBIN: 13.4 g/dL (ref 11.3–14.9)
LYMPHOCYTES ABSOLUTE COUNT: 1.6 10*9/L (ref 1.1–3.6)
LYMPHOCYTES RELATIVE PERCENT: 40.7 %
MEAN CORPUSCULAR HEMOGLOBIN CONC: 32.8 g/dL (ref 32.0–36.0)
MEAN CORPUSCULAR HEMOGLOBIN: 28 pg (ref 25.9–32.4)
MEAN CORPUSCULAR VOLUME: 85.5 fL (ref 77.6–95.7)
MEAN PLATELET VOLUME: 8.2 fL (ref 6.8–10.7)
MONOCYTES ABSOLUTE COUNT: 0.5 10*9/L (ref 0.3–0.8)
MONOCYTES RELATIVE PERCENT: 11.6 %
NEUTROPHILS ABSOLUTE COUNT: 1.7 10*9/L — ABNORMAL LOW (ref 1.8–7.8)
NEUTROPHILS RELATIVE PERCENT: 44.1 %
PLATELET COUNT: 313 10*9/L (ref 150–450)
RED BLOOD CELL COUNT: 4.78 10*12/L (ref 3.95–5.13)
RED CELL DISTRIBUTION WIDTH: 14.1 % (ref 12.2–15.2)
WBC ADJUSTED: 3.9 10*9/L (ref 3.6–11.2)

## 2023-05-18 LAB — IRON & TIBC
IRON SATURATION: 14 % — ABNORMAL LOW (ref 20–55)
IRON: 50 ug/dL
TOTAL IRON BINDING CAPACITY: 356 ug/dL (ref 250–425)

## 2023-05-18 LAB — C3 COMPLEMENT: C3 COMPLEMENT: 126 mg/dL (ref 90–170)

## 2023-05-18 LAB — PROTEIN / CREATININE RATIO, URINE
CREATININE, URINE: 102 mg/dL
PROTEIN URINE: 19.9 mg/dL
PROTEIN/CREAT RATIO, URINE: 0.195

## 2023-05-18 LAB — CREATININE
CREATININE: 0.82 mg/dL
EGFR CKD-EPI (2021) FEMALE: 79 mL/min/{1.73_m2} (ref >=60)

## 2023-05-18 LAB — C4 COMPLEMENT: C4 COMPLEMENT: 22.6 mg/dL (ref 12.0–36.0)

## 2023-05-18 LAB — ALT: ALT (SGPT): 11 U/L (ref 10–49)

## 2023-05-18 LAB — TRANSFERRIN: TRANSFERRIN: 285 mg/dL

## 2023-05-18 LAB — FERRITIN: FERRITIN: 24.4 ng/mL

## 2023-05-18 LAB — AST: AST (SGOT): 15 U/L (ref <=34)

## 2023-05-18 LAB — FOLATE: FOLATE: 12 ng/mL (ref >=5.4)

## 2023-05-18 LAB — VITAMIN B12: VITAMIN B-12: 407 pg/mL (ref 211–911)

## 2023-05-18 MED ORDER — HYDROXYCHLOROQUINE 200 MG TABLET
ORAL_TABLET | Freq: Two times a day (BID) | ORAL | 3 refills | 90 days | Status: CP
Start: 2023-05-18 — End: ?

## 2023-05-18 MED ORDER — DICLOFENAC 1 % TOPICAL GEL
Freq: Two times a day (BID) | TOPICAL | 5 refills | 75 days | Status: CP | PRN
Start: 2023-05-18 — End: ?

## 2023-05-18 MED ORDER — MYCOPHENOLATE SODIUM 360 MG TABLET,DELAYED RELEASE
ORAL_TABLET | Freq: Two times a day (BID) | ORAL | 3 refills | 90 days | Status: CP
Start: 2023-05-18 — End: ?
  Filled 2023-06-05: qty 360, 90d supply, fill #0

## 2023-05-18 NOTE — Unmapped (Signed)
Patient Name: Caitlin Smith  PCP: ??Sowles, [name], MD  Source of History: patient and records  Date of Visit: 05/18/23 9:01 AM    Chief Compliant: follow-up for SLE and FMS    PRIOR RHEUMATOLOGIC HISTORY:??SLE, OA, and fibromyalgia.  Initially diagnosed in 2006 with SLE presenting with episcleritis and arthralgia with serology showing +ANA, +dsDNA.  She also has stage I CKD felt secondary not due to SLE but due to HTN and obesity. In 04/2016 she was dx with eosinophilic pneumonitis, later evaluation most consistent with organizing pneumonia, following with pulmonology.  Treatment history:  - HCQ  - Previously treated with prednisone for eosinophilic pneumonitis with difficulty tapering due to pulm symptoms, so cellcept was added 2018.   - Cellcept changed to myfortic 2019 due to GI distress. Myfortic increased from 360 mg po bid to 720 mg po bid in January 2023.  - tapered off prednisone in late 2019  - Derm eval in 07/2018 consistent with inverse psoriasis, possibly due to prednisone use. Recommended avoiding systemic steroids when possible. - Myfortic increased to 720 mg BID in 09/2021 due to worsening ILD.   - Prednisone (to be taken indefinitely) resumed by pulmonology in 12/2021. Tapered from 10 mg to 7.5 mg daily by Pulm in August 2023. November 2023 prednisone tapered to 5 mg daily.   - Current med regimen: Myfortic 720 mg twice daily, Plaquenil 200 mg twice daily, Prednisone 5 mg qd (per pulm).  Also followed by pain clinic for pharmacotherapy for fibromyalgia.    HPI: Caitlin Smith is a 67 y.o. female who presents for her follow-up for SLE complicated by eosinophilic pneumonitis vs organizing pneumonia and Inverse psoriasis managed topically as well as underlying fibromyalgia. She was last evaluated by me in November 2023 and has not followed up in person since for unclear reasons. She was scheduled for a follow-up in March 2024 with Carlus Pavlov [facility] but this was canceled.  She presents to clinic today in person.    In the interim, saw pulmonary in February 2024 and nephrology in June 2024. No signs for active lupus in kidney and pulmonary status stable. She had been concerned about weight loss and had recent visit with GI in September 2024. She is not sure why the appointment in March 2024 with Carlus Pavlov [facility] was canceled. She is reliant on transportation by taking BUS. She reports balance issues and frequent falls. She is planning to start physical therapy later this month set up by neurology. Neurology is evaluating the balance. She is weaning off prednisone with pulmonary and is down to 1 mg daily now. She plans to wean off completely this month. She remains on Myfortin 720 mg bid and hydroxychloroquine 200 mg po bid. Her pulmonologist, Dr. Andres Ege has left and has not been set up for follow-up. She reports breathing as stable. Still gets dyspnea on exertion. No major issues with the psoriasis. She reports pain in her knees, shoulders and low back. Pain worse when transitioning from sitting to standing. She reports numbness in her feet from her neuropathy. Neurology is aware and managing. She is wondering about another injection to her back. She is scheduled to see Pain management next week for this. Weight has stabilized.     ROS??: Attests to the above, otherwise, review of all other systems is negative.   ??  Past Medical and Surgical History:  ??  Patient Active Problem List    Diagnosis Date Noted    PVC (premature ventricular  contraction) 08/03/2022    Immunocompromised (CMS-HCC) 04/29/2021    Otalgia 12/03/2019    Hyperglycemia 10/23/2017    Bronchiolitis obliterans organizing pneumonia (CMS-HCC) 09/29/2016    Vertigo 09/29/2016    Neck pain 12/15/2015    Fibromyalgia 10/12/2015    Chronic constipation 04/11/2015    Chronic recurrent major depressive disorder (CMS-HCC) 04/11/2015    Dyslipidemia 04/11/2015    Obesity, diabetes, and hypertension syndrome (CMS-HCC) 04/11/2015 Intertrigo 04/11/2015    Fibrositis 04/11/2015    Gastroesophageal reflux disease without esophagitis 04/11/2015    Vitamin D deficiency 04/11/2015    Seborrhea capitis 04/11/2015    Perennial allergic rhinitis with seasonal variation 04/11/2015    Moderate dysplasia of vulva 04/11/2015    Spinal stenosis of lumbar region 04/11/2015    Keratosis pilaris 04/11/2015    Insomnia, persistent 04/11/2015    Auditory impairment 04/11/2015    Eczema intertrigo 04/11/2015    Neurogenic claudication 04/11/2015    Screening for cardiovascular condition 03/05/2015    Encounter for screening for cardiovascular disorders 03/05/2015    Treadmill stress test negative for angina pectoris 03/05/2015    Cough 05/20/2014    Shortness of breath 05/20/2014    Regurgitation 04/24/2014    Epigastric burning sensation 04/24/2014    Obesity with body mass index 30 or greater 02/03/2014    S/P total knee replacement 12/31/2013    Asthma 12/31/2013    Asthma, chronic 12/31/2013    H/O total knee replacement 12/31/2013    SLE (systemic lupus erythematosus) (CMS-HCC) 11/28/2013    Episcleritis 09/26/2013    Other diseases of vocal cords 05/28/2013    Vocal cord dysfunction 05/28/2013    Generalized anxiety disorder 03/19/2013    Pain medication agreement signed 02/12/2013    Family history of breast cancer     VIN II (vulvar intraepithelial neoplasia II) 01/25/2013    Back pain 12/18/2012    Multiple pulmonary nodules 11/26/2012    Lung nodule, multiple 11/26/2012    Pulmonary nodules 11/26/2012    Osteoarthrosis 11/01/2012    Osteoarthritis 11/01/2012    Arthritis, degenerative 11/01/2012    Postinflammatory pulmonary fibrosis (CMS-HCC) 10/22/2012    Aspiration pneumonitis (CMS-HCC) 10/22/2012    H/O aspiration pneumonitis 10/22/2012    Mixed urge and stress incontinence 05/04/2012    Dysphonia 03/22/2012    Sensorineural hearing loss 03/22/2012    Myalgia and myositis 02/25/2011    Benign neoplasm of pituitary gland and craniopharyngeal duct (pouch) (CMS-HCC) 12/29/2010    Chronic kidney disease, stage I 12/08/2010    Proteinuria 12/08/2010    Lung involvement in systemic lupus erythematosus (CMS-HCC) 11/05/2010    Chronic nausea 07/01/2008    Hypertension, benign 01/25/2005     Past Surgical History:   Procedure Laterality Date    BRAIN SURGERY      for facial spasms    BREAST BIOPSY Right 2012    Needle bx    GALLBLADDER SURGERY      HYSTERECTOMY N/A 07/09/2001    Vaginal Hysterectomy with ovaries in place    JOINT REPLACEMENT Right     3 TO 4 YEARS AGO    PR ANAL PRESSURE RECORD N/A 03/22/2016    Procedure: ANORECTAL MANOMETRY;  Surgeon: Nurse-Based Giproc;  [facility]: [facility];  Service: Gastroenterology    PR BRONCHOSCOPY,DIAGNOSTIC W LAVAGE Bilateral 04/25/2016    Procedure: BRONCHOSCOPY, RIGID OR FLEXIBLE, INCLUDE FLUOROSCOPIC GUIDANCE WHEN PERFORMED; W/BRONCHIAL ALVEOLAR LAVAGE WITH MODERATE SEDATION;  Surgeon: Mercy Moore, MD;  Location: [facility] PROCEDURE LAB  [facility];  Service: Pulmonary    PR BRONCHOSCOPY,TRANSBRONCH BIOPSY N/A 04/25/2016    Procedure: BRONCHOSCOPY, RIGID/FLEXIBLE, INCLUDE FLUORO GUIDANCE WHEN PERFORMED; W/TRANSBRONCHIAL LUNG BX, SINGLE LOBE WITH MODERATE SEDATION;  Surgeon: Mercy Moore, MD;  Location: [facility] PROCEDURE LAB [facility];  Service: Pulmonary    PR COLON CA SCRN NOT HI RSK IND  08/22/2013    Procedure: COLOREC CNCR SCR;COLNSCPY NO;  Surgeon: Brown Human, MD;  Location: GI PROCEDURES [facility];  Service: Gastroenterology    PR COLONOSCOPY FLX DX W/COLLJ SPEC WHEN PFRMD N/A 10/12/2021    Procedure: COLONOSCOPY, FLEXIBLE, PROXIMAL TO SPLENIC FLEXURE; DIAGNOSTIC, W/WO COLLECTION SPECIMEN BY BRUSH OR WASH;  Surgeon: Luanne Bras, MD;  Location: [facility];  Service: Gastroenterology    PR COLSC FLX W/RMVL OF TUMOR POLYP LESION SNARE TQ N/A 10/12/2021    Procedure: COLONOSCOPY FLEX; W/REMOV TUMOR/LES BY SNARE;  Surgeon: Luanne Bras, MD;  Location: [facility] PROCEDURES [facility];  Service: Gastroenterology    PR GERD TST W/ MUCOS IMPEDE ELECTROD,>1HR N/A 05/26/2014    Procedure: ESOPHAGEAL FUNCTION TEST, GASTROESOPHAGEAL REFLUX TEST W/ NASAL CATHETER INTRALUMINAL IMPEDANCE ELECTRODE(S) PLACEMENT, RECORDING, ANALYSIS AND INTERPRETATION; PROLONGED;  Surgeon: Nurse-Based Giproc;  [facility]: GI [facility];  Service: Gastroenterology    PR TOTAL KNEE ARTHROPLASTY Right 12/31/2013    Procedure: ARTHROPLASTY, KNEE, CONDYLE & PLATEAU; MEDIAL & LAT COMPARTMENT W/WO PATELLA RESURFACE (TOTAL KNEE ARTHROP);  Surgeon: Aram Beecham, MD;  Location: MAIN OR [facility];  Service: Orthopedics    PR UPPER GI ENDOSCOPY,BIOPSY N/A 11/07/2014    Procedure: UGI ENDOSCOPY; WITH BIOPSY, SINGLE OR MULTIPLE;  Surgeon: Trula Slade, MD;  Location: GI PROCEDURES [facility] [facility];  Service: Gastroenterology    SKIN BIOPSY      wide local excision of vulva Right      Allergies:   ??  Allergies   Allergen Reactions    Vilazodone Swelling    Ace Inhibitors Other (See Comments)     Pt states she can not take ace inhibitors. Pt states she can not remember her reaction.     Bee Pollen Itching     COUGH, WATERY EYES    Penicillin G Rash    Penicillins Rash    Pollen Extracts Itching     COUGHING, WATERY EYES     Current Outpatient Medications:  ??  Current Outpatient Medications on File Prior to Visit   Medication Sig Dispense Refill    albuterol HFA 90 mcg/actuation inhaler Inhale 2 puffs every six (6) hours as needed for wheezing. 8 g 11    amLODIPine (NORVASC) 2.5 MG tablet Take 3 tablets (7.5 mg total) by mouth daily.      aspirin (ECOTRIN) 81 MG tablet Take 1 tablet (81 mg total) by mouth daily.      atenolol (TENORMIN) 25 MG tablet TAKE 1 AND 1/2 TABLETS BY MOUTH  TWICE DAILY 300 tablet 1    atorvastatin (LIPITOR) 40 MG tablet Take 1 tablet (40 mg total) by mouth daily.      biotin 1 mg cap Take by mouth.      buPROPion (WELLBUTRIN XL) 300 MG 24 hr tablet Take 1 tablet (300 mg total) by mouth daily. 90 tablet 3    clonazePAM (KLONOPIN) 0.5 MG tablet Take 0.5 tablets (0.25 mg total) by mouth Three (3) times a day as needed for anxiety. 30 tablet 1    clotrimazole-betamethasone (LOTRISONE) 1-0.05 % cream Apply 1 Application topically two (2) times a  day.      cyclobenzaprine (FLEXERIL) 5 MG tablet Take 1 tablet (5 mg total) by mouth nightly as needed for muscle spasms. 60 tablet 2    diclofenac sodium (VOLTAREN) 1 % gel Apply 2 g topically two (2) times a day as needed for arthritis. 300 g 5    dicyclomine (BENTYL) 20 mg tablet Take 1 tablet (20 mg total) by mouth daily as needed.      famotidine (PEPCID) 20 MG tablet TAKE 1 TABLET BY MOUTH TWICE  DAILY 200 tablet 2    fluticasone propionate (FLONASE) 50 mcg/actuation nasal spray       hydrALAZINE (APRESOLINE) 10 MG tablet       hydroxychloroquine (PLAQUENIL) 200 mg tablet Take 1 tablet (200 mg total) by mouth two (2) times a day. 180 tablet 3    hydrOXYzine (ATARAX) 10 MG tablet       inhalational spacing device (AEROCHAMBER MV) Spcr 1 each by Miscellaneous route two (2) times a day. With Fovent 1 each 0    lamoTRIgine (LAMICTAL) 200 MG tablet TAKE 1 TABLET BY MOUTH TWICE  DAILY 200 tablet 2    lidocaine (XYLOCAINE) 5 % ointment Apply topically two (2) times a day. 100 g 2    linaCLOtide (LINZESS) 290 mcg capsule Take 1 capsule (290 mcg total) by mouth daily. 30 capsule 5    loratadine (CLARITIN) 10 mg tablet Take 1 tablet (10 mg total) by mouth daily.      melatonin 10 mg Tab Take 1 tablet by mouth.      metFORMIN (GLUCOPHAGE-XR) 500 MG 24 hr tablet Take 1 tablet (500 mg total) by mouth daily before breakfast.      mupirocin (BACTROBAN) 2 % ointment APP EXT IEN BID  0    mycophenolate (MYFORTIC) 360 MG TbEC Take 2 tablets (720 mg total) by mouth two (2) times a day. 360 tablet 3    olopatadine (PATANOL) 0.1 % ophthalmic solution Apply 1 drop to eye daily.      omeprazole (PRILOSEC) 40 MG capsule Take 1 capsule (40 mg total) by mouth two (2) times a day. 180 capsule 3    ondansetron (ZOFRAN) 4 MG tablet       polyethylene glycol (GLYCOLAX) 17 gram/dose powder Take 17 g by mouth daily.      predniSONE (DELTASONE) 1 MG tablet Take 4 tablets (4 mg total) by mouth daily for 30 days, THEN 3 tablets (3 mg total) daily for 30 days, THEN 2 tablets (2 mg total) daily for 30 days, THEN 1 tablet (1 mg total) daily. 300 tablet 0    [START ON 05/25/2023] predniSONE (DELTASONE) 1 MG tablet Take 2.5 tablets (2.5 mg total) by mouth daily. To start once prednisone taper has been completed (should be on/around 05/25/23) 60 tablet 0    pregabalin (LYRICA) 50 MG capsule TAKE 1 CAPSULE (50 MG TOTAL) BY MOUTH THREE (3) TIMES A DAY. 90 capsule 1    telmisartan (MICARDIS) 80 MG tablet Take 1 tablet (80 mg total) by mouth.      traZODone (DESYREL) 100 MG tablet Take 1.5 tablets (150 mg total) by mouth nightly. 135 tablet 3     No current facility-administered medications on file prior to visit.     Immunization History   Administered Date(s) Administered    COVID-19 VAC,BIVALENT,MODERNA(BLUE CAP) 12/28/2021    COVID-19 VACCINE,MRNA(MODERNA)(PF) 11/20/2019, 12/23/2019, 08/25/2020, 12/31/2020, 04/29/2021    Covid-19 Vac, (26yr+) (Spikevax) Monovalent Xbb.1.5 Moder  07/14/2022    HEPATITIS  B VACCINE ADULT,IM(ENERGIX B, RECOMBIVAX) 06/26/2007, 08/12/2015, 10/13/2015, 02/10/2016    Hepatitis A (Adult) 08/12/2015, 02/10/2016    INFLUENZA TIV (TRI) 26MO+ W/ PRESERV (IM) 06/01/2010, 05/14/2013, 04/19/2014    INFLUENZA TIV (TRI) PF (IM) 06/26/2007, 06/02/2011, 06/02/2011, 12/01/2011, 06/09/2013    Influenza Vaccine Quad(IM)6 MO-Adult(PF) 04/16/2015, 05/26/2016, 05/17/2017, 05/14/2018, 06/12/2019, 06/15/2021, 06/06/2022    Influenza Virus Vaccine, unspecified formulation 06/05/2020    MMR 06/26/2007    Measles / Rubella 10/05/1987    PNEUMOCOCCAL POLYSACCHARIDE 23-VALENT 08/04/2016    Pneumococcal Conjugate 13-Valent 08/06/2015, 05/26/2016    Pneumococcal Conjugate 20-valent 09/30/2021 SHINGRIX-ZOSTER VACCINE (HZV),RECOMBINANT,ADJUVANTED(IM) 12/28/2021, 03/15/2022    TdaP 06/26/2007, 07/14/2021     ??  PHYSICAL EXAM? - exam not significantly changed.  Vital signs: BP 136/91  - Pulse 62  - Temp 36.3 ??C (97.3 ??F) (Temporal)  - Wt 90.4 kg (199 lb 3.2 oz)  - BMI 30.29 kg/m?? Body mass index is 30.29 kg/m??.  Gen: Well-developed, well-nourished adult in no apparent distress. Normocephalic with no external signs of trauma. Pleasant and cooperative. AOx4.?  HEENT: PERRLA, EOMI, MMM, oropharynx in pink without ulcerations or thrush.   Lungs: Broad chest excursion with good air movement. CTAB without wheezing/rhonchi/rales. ??  CV: RRR, Normal S1/S2, No murmurs/rubs/gallops heard. ?  PV: Warm, 2+ radial and pedal pulses, no C/C/E.??  Neuro: Good comprehension/cognition. CN 2-12 intact.  Muscle strength 5/5 in all extremities. Gait normal.?? Numbness in feet. Tremor in hands at rest.   Comprehensive Musculoskeletal Examination:??  Jaw, neck without limited ROM.??  Shoulders, elbows, wrists, hands, fingers:  No deformity, erythema, warmth, swelling, effusion, tenderness, limited ROM.??   Hips without limited ROM.??  Knee, ankles, feet, toes: Crepitus in left knee and notes pain with ROM, slightly limited. S/p R TKR.  Ankles and feet without swelling or limited ROM. Notes numbness in feet.   Skin: No rashes noted on exam today In axilla, stomach where prior inverse psoriasis has been    LABORATORY - monitoring labs completed at visit with results returned not suggestive for increased lupus activity  Recent Results (from the past 168 hour(s))   Creatinine    Collection Time: 05/18/23  9:57 AM   Result Value Ref Range    Creatinine 0.82 0.55 - 1.02 mg/dL    eGFR CKD-EPI (0981) Female 79 >=60 mL/min/1.61m2   C3 complement    Collection Time: 05/18/23  9:57 AM   Result Value Ref Range    C3 Complement 126 90 - 170 mg/dL   C4 complement    Collection Time: 05/18/23  9:57 AM   Result Value Ref Range    C4 Complement 22.6 12.0 - 36.0 mg/dL   AST    Collection Time: 05/18/23  9:57 AM   Result Value Ref Range    AST 15 <=34 U/L   ALT    Collection Time: 05/18/23  9:57 AM   Result Value Ref Range    ALT 11 10 - 49 U/L   Ferritin    Collection Time: 05/18/23  9:57 AM   Result Value Ref Range    Ferritin 24.4 7.3 - 270.7 ng/mL   Iron & TIBC    Collection Time: 05/18/23  9:57 AM   Result Value Ref Range    Iron 50 50 - 170 ug/dL    TIBC 191 478 - 295 ug/dL    Iron Saturation (%) 14 (L) 20 - 55 %   Transferrin    Collection Time: 05/18/23  9:57 AM   Result Value  Ref Range    Transferrin 285.0 250.0 - 380.0 mg/dL   Vitamin A54 Level    Collection Time: 05/18/23  9:57 AM   Result Value Ref Range    Vitamin B-12 407 211 - 911 pg/ml   Folate Level    Collection Time: 05/18/23  9:57 AM   Result Value Ref Range    Folate 12.0 >=5.4 ng/mL   CBC w/ Differential    Collection Time: 05/18/23  9:57 AM   Result Value Ref Range    WBC 3.9 3.6 - 11.2 10*9/L    RBC 4.78 3.95 - 5.13 10*12/L    HGB 13.4 11.3 - 14.9 g/dL    HCT 09.8 11.9 - 14.7 %    MCV 85.5 77.6 - 95.7 fL    MCH 28.0 25.9 - 32.4 pg    MCHC 32.8 32.0 - 36.0 g/dL    RDW 82.9 56.2 - 13.0 %    MPV 8.2 6.8 - 10.7 fL    Platelet 313 150 - 450 10*9/L    Neutrophils % 44.1 %    Lymphocytes % 40.7 %    Monocytes % 11.6 %    Eosinophils % 3.1 %    Basophils % 0.5 %    Absolute Neutrophils 1.7 (L) 1.8 - 7.8 10*9/L    Absolute Lymphocytes 1.6 1.1 - 3.6 10*9/L    Absolute Monocytes 0.5 0.3 - 0.8 10*9/L    Absolute Eosinophils 0.1 0.0 - 0.5 10*9/L    Absolute Basophils 0.0 0.0 - 0.1 10*9/L   Protein/Creatinine Ratio, Urine    Collection Time: 05/18/23 10:22 AM   Result Value Ref Range    Creat U 102.0 Undefined mg/dL    Protein, Ur 86.5 Undefined mg/dL    Protein/Creatinine Ratio, Urine 0.195 Undefined   Urinalysis with Microscopy with Culture Reflex    Collection Time: 05/18/23 10:22 AM   Result Value Ref Range    Color, UA Light Yellow     Clarity, UA Clear     Specific Gravity, UA 1.018 1.003 - 1.030    pH, UA 7.0 5.0 - 9.0    Leukocyte Esterase, UA Negative Negative    Nitrite, UA Negative Negative    Protein, UA Trace (A) Negative    Glucose, UA Negative Negative    Ketones, UA Negative Negative    Urobilinogen, UA <2.0 mg/dL <7.8 mg/dL    Bilirubin, UA Negative Negative    Blood, UA Negative Negative    RBC, UA <1 <=4 /HPF    WBC, UA <1 0 - 5 /HPF    Squam Epithel, UA <1 0 - 5 /HPF    Bacteria, UA None Seen None Seen /HPF    Mucus, UA Rare (A) None Seen /HPF     ??GENERAL SUMMARY AND IMPRESSION: ??  ??  In summary, the patient is a 67 y.o. female with SLE complicated by eosinophilic pneumonitis vs organizing pneumonia and Inverse psoriasis managed topically as well as underlying fibromyalgia. She appears well on Myfortic 720 mg po bid and hydroxychloroquine 200 mg po bid, and these are to be continued. She has weaned prednisone to 1 mg daily. Needs a new pulmonologist and referral entered. She is overdue for eye exam and referral entered to ophthalmology. Lab monitoring for disease activity. Discussed joint pain related to OA and symptomatic management with topical diclofenac gel. Follow-up in 4 months with Carlus Pavlov [facility] and 8 months with myself.  Return in about 8 months (around 01/15/2024).  RECOMMENDATIONS: ??  ????   Diagnosis ICD-10-CM Associated Orders   1. Systemic lupus erythematosus, unspecified SLE type, unspecified organ involvement status (CMS-HCC)  M32.9 Ambulatory referral to Pulmonology     CBC w/ Differential     Creatinine     Anti-DNA antibody, double-stranded     C3 complement     C4 complement     AST     ALT     Urinalysis with Microscopy with Culture Reflex     Protein/Creatinine Ratio, Urine     25 OH Vit D     Serum Protein Electrophoresis and Immunofixation     hydroxychloroquine (PLAQUENIL) 200 mg tablet     Ambulatory referral to Ophthalmology     Total Protein for SPE      2. Postinflammatory pulmonary fibrosis (CMS-HCC)  J84.10 Ambulatory referral to Pulmonology      3. On mycophenolate mofetil therapy  Z79.899 CBC w/ Differential     Creatinine     Anti-DNA antibody, double-stranded     C3 complement     C4 complement     AST     ALT     Urinalysis with Microscopy with Culture Reflex     Protein/Creatinine Ratio, Urine     25 OH Vit D      4. Body mass index (BMI) 30.0-30.9, adult  Z68.30 25 OH Vit D      5. Neuropathy  G62.9 Ferritin     Iron & TIBC     Transferrin     Vitamin B12 Level     Folate Level      6. Iron deficiency  E61.1 Ferritin     Iron & TIBC     Transferrin      7. Primary osteoarthritis involving multiple joints  M15.9 diclofenac sodium (VOLTAREN) 1 % gel      8. Other systemic lupus erythematosus with other organ involvement (CMS-HCC)  M32.19 mycophenolate (MYFORTIC) 360 MG TbEC      9. Long-term use of Plaquenil  Z79.899 Ambulatory referral to Ophthalmology            Patient Instructions   No signs for active inflammatory arthritis.    Continue current medication regime and taper off prednisone as per pulmonary    Labs and urine studies today    Please make sure you arrange eye exam at [facility] center [phone]    Referral entered to pulmonary so you can get set back up with them    Joint pain more likely due to osteoarthritis. Try topical Voltaren (diclofenac) gel.   The patient indicates understanding of these issues and agrees to the plan as outlined above.  Contact information provided for any concerns or questions in the interim.    I personally spent 34 minutes face-to-face and non-face-to-face in the care of this patient, which includes all pre, intra, and post visit time on the date of service.  All documented time was specific to the E/M visit and does not include any procedures that may have been performed.        Felipe Paluch C. Scarlette Calico, MD, PhD  Assistant Professor of Medicine  Department of Medicine/Division of Rheumatology  [facility]  6:09 PM

## 2023-05-18 NOTE — Unmapped (Addendum)
No signs for active inflammatory arthritis.    Continue current medication regime and taper off prednisone as per pulmonary    Labs and urine studies today    Please make sure you arrange eye exam at [facility] center [phone]    Referral entered to pulmonary so you can get set back up with them    Joint pain more likely due to osteoarthritis. Try topical Voltaren (diclofenac) gel.

## 2023-05-19 LAB — SERUM PROTEIN ELECTROPHORESIS AND IMMUNOFIXATION
ALBUMIN (SPE): 3.9 g/dL (ref 3.5–5.0)
ALPHA-1 GLOBULIN: 0.3 g/dL (ref 0.2–0.5)
ALPHA-2 GLOBULIN: 0.7 g/dL (ref 0.5–1.1)
BETA-1 GLOBULIN: 0.5 g/dL (ref 0.3–0.6)
BETA-2 GLOBULIN: 0.5 g/dL (ref 0.2–0.6)
GAMMAGLOBULIN: 1.7 g/dL — ABNORMAL HIGH (ref 0.5–1.5)
PROTEIN TOTAL (SPECIAL CHEM): 7.5 g/dL

## 2023-05-22 LAB — VITAMIN D 25 HYDROXY: VITAMIN D, TOTAL (25OH): 34.2 ng/mL (ref 20.0–80.0)

## 2023-05-23 LAB — ANTI-DNA ANTIBODY, DOUBLE-STRANDED: DSDNA ANTIBODY: NEGATIVE

## 2023-05-23 NOTE — Unmapped (Signed)
Chronic Pain Follow Up Note    Assessment and Plan  Caitlin Smith is a 67 y.o.  being followed at [facility] for complaint of chronic pain localized to right knee and bilateral neck, shoulders and upper back myofascial pain and fibromyalgia, and lower back 2/2 lumbar facet arthropathy and DDD and moderate degree of lumbar central spinal canal stenosis. Patient has a history of chronic pain related to lupus and arthritis and is on rheumatological therapy.     Bilateral shoulder pain  Likely multifactorial, myofascial pain in setting of fibromyalgia and OA (radiographs with moderate AC OA). Denies weakness, tingling, radiculopathic pain.  - Continue Voltaren gel TID  - If pain worsening, referral to ortho for possible shoulder injections    Polyneuropathy   Patient endorses numbness in bilateral feet. Transitioned from gabapentin to Lyrica that she was able to escalate to 50 mg BID without any side effects and analgesic benefit. Neuropathic symptoms worsening and patient is interested in escalation of pregabalin dose. Can also trial Zynex for symptomatic relief.  - Increase Lyrica to 75 mg BID  - Continue topical lidocaine 5% BID  - Ordered TENS unit + sock    Chronic pain syndrome; Fibromyalgia   Patient notes that Lyrica and Flexeril are helping with pain, though symptoms are worsened recently and regimen is less effective.  - Continue Flexeril 5 mg BID PRN, refilled  - Continue vanlafaxine per PCP  - Continue Trazodone 150 mg qhs per PCP  - Continue voltaren Gel  - Continue pain psychology.  - Continue PT and activity    Chronic lower back pain; Facet Arthropathy, likely SIJ Dysfunction   Secondary to SIJ and lumbar facet arthropathy and DDD and moderate degree of lumbar central spinal canal stenosis. Previously had good relief with L4/5 and L5/S1 facet injections in the past and may benefit from repeat injections.   - Continue Voltaren gel as above  - Consider repeat bilateral L4/L5 and L5/S1 MBB/RFA  - Ordered XR of lumbar spine to reevaluate    Requested Prescriptions     Signed Prescriptions Disp Refills    lidocaine (XYLOCAINE) 5 % ointment 480 g 0     Sig: Apply topically four (4) times a day.    pregabalin (LYRICA) 75 MG capsule 180 capsule 0     Sig: Take 1 capsule (75 mg total) by mouth two (2) times a day.    cyclobenzaprine (FLEXERIL) 5 MG tablet 180 tablet 1     Sig: Take 1 tablet (5 mg total) by mouth two (2) times a day as needed for muscle spasms.     Orders Placed This Encounter   Procedures    XR Lumbar Spine AP Lateral Flexion And Extension     Standing Status:   Future     Standing Expiration Date:   05/23/2024     Order Specific Question:   Performed at     Answer:   [facility]     Order Specific Question:   Reason for Exam:     Answer:   Worsening low back pain in the setting of known DDD     HPI  Caitlin Smith is a 67 y.o. being followed at [facility] for complaint of chronic pain localized to right knee and bilateral neck, shoulder, upper back, and lower back 2/2 lumbar facet arthropathy and DDD. Patient has a history of chronic pain related to lupus, arthritis, and fibromyalgia.     At last  visit in December the patient was seen virtually on her request as she was unable to travel to the clinic. She was being seen for medication refills. She reported doing fair and her pain remained stable. She continued to struggle with pulmonary symptoms. Most of her pain was localized to joints and likely SLE related and LBP. Patient reported tolerating medications well and denied any new onset symptoms or red flags and wished to continue on medications as currently prescribed.     Since last visit patient has followed with pain psychology and psychiatry. She also presented to the ED on 04/07/23 for lower urinary tract infection.     Today, patient reports that she is in a lot of pain in her shoulders, back, legs, ankles, knees and reports numbness in her feet. She notes that her rheumatologist said it may be osteoarthritis. She notes that her back is causing her the most pain currently. She notes that her pain travels from her back down to her legs and she has some slight neck pain. She inquired as to if there is anything she can wear to help with neuropathy in her feet.     In terms of medications she inquired if she could use lidocaine whenever she wishes to. She wished to pick up her 90 day supply all at once. She expressed interest in increasing Lyrica as it is more difficult for her to take it TID and would prefer a higher dose BID. She notes that she has been weaning off of prednisone which could be why the pain is increasing.     The patient's medication regimen:  Cymbalta 120mg  am (PCP)  Trazodone 150 mg qhs (PCP)  Flexeril 5 mg BID prn  Voltaren gel prn  Lyrica 75 mg BID     Current view: Showing all answers           [facility] Return Patient Questionnaire       Question 05/22/2023  9:33 AM EDT - [name] by Patient    What is the reason for your visit?       Date of onset of your pain:       Please rate your pain at its WORST in the past month. 6    Please rate your pain at its LEAST in the past month. 5    Please rate your pain as it is RIGHT NOW. 7    Please rate your pain on AVERAGE in the past month. 6    Please circle the location of your pain.       Please select the words that describe your pain. Aching     Sharp    How often do you have pain? All the time    When is your pain the worst?       Which of the following have been negatively affected by your pain? Walking     Sitting     Standing    Since your last visit:     Have you had any of the following? Emergency Room visit     Primary Care Visit     Mental Health visit    Do you have any new pain you would like to discuss with your doctor? No    How has your pain changed? Worse    Are you currently taking any blood-thinners or anticoagulants? No    If you are on Pain Medication - Are you having any of the following? Dry mouth  Constipation     Dizziness    If you have had a procedure since your last visit, how much pain relief was obtained?       If you have had a procedure since your last visit, were there any complications?       General: Weight Loss/Gain     Fatigue     Daytime Drowsiness    Cardiovascular: Irregular Heartbeat     Shortness of Breath with Exertion     Difficulty Breathing when Laying Flat    Gastrointestinal - (Intestinal): Nausea     Stomach Pain     Constipation     Gastric Reflux    Skin: Itching    Endocrine (Hormonal System): Thinning hair    Musculoskeletal System - (Muscles, Joints and Coverings): Joint Aches/Swelling     Back pain     Muscle Aches/Weakness    Neurologic: Coordination Difficulty     Numbness/Tingling    Psychiatric: Anxiety     Low Concentration          [facility] Patient-Entered Hpi Selection Questionnaire       Question 05/22/2023  9:44 AM EDT - [name] by Patient    What is the primary reason for your visit? Neurological Problem    Are you having any of these problems?     Clumsiness Yes    Confusion or odd behavior       Fainting       Loss of balance Yes    Loss of feeling in a part of your body Yes    Memory problems       Nearly fainting Yes    Slurred speech       Vision change       Weakness       Weakness in a part of your body Yes    Your neurological problem       When did you first notice this problem? More than 1 month ago    How would you describe the start of your neurological problem? Suddenly    Since you first noticed this problem, how has it changed? Gradually worsening    In which part of your body have you noticed this problem? Left side     Right side     Legs     Arms    Are you experiencing any of the following symptoms?     Abdominal pain       An unusual sensation (light, sound, odor) before headache or seizure       Back pain Yes    Bladder incontinence       Bowel incontinence       Chest pain       Confusion       Dizziness       Fatigue Yes    Feeling like the room is spinning Yes    Fever       Headaches Yes    Hearing changes       Light-headedness Yes    Nausea       Neck pain       Pounding in the chest       Shortness of breath Yes    Sweating much more than normal       Vomiting       Which of the following treatments have you tried? Aspirin     Medication     Walking    If you've tried a  treatment for this problem, how much relief did you experience? None              Patient denies homicidal/suicidal ideation.    Medication Monitoring  [place] database was reviewed and it was appropriate.  Last urine toxicology screen: N/A, not on opioids  The patient did not bring pill bottles today, not on opioids    Imaging   MRI lumbar spine 10/28/20  FINDINGS:  There is mild lumbar dextrorotatory scoliosis centered at the L2-L3 level. Trace retrolisthesis of L5 on S1. The remaining lumbar vertebral bodies are normally aligned.      Bone marrow signal intensity is normal. The visualized cord is unremarkable and the conus medullaris ends at a normal level.     A Schmorl's node is noted within the inferior endplate of T11. Heights of the remaining vertebral bodies are preserved. There is multilevel disc desiccation. Mild loss of intervertebral disc height at L5-S1.      T12-L1 and L1-L2: No significant canal or neural foraminal stenosis. Mild facet degeneration and ligamentum flavum thickening.     L2-L3: Circumferential disc bulge with mild facet hypertrophy and ligamentum flavum thickening. No significant central canal stenosis. Mild right greater than left neural foraminal narrowing.      L3-L4: Circumferential disc bulge with mild facet arthropathy and moderate ligamentum flavum thickening. Mild central canal stenosis. Mild bilateral neural foraminal narrowing.     L4-L5: Circumferential disc bulge with a tiny, superimposed shallow central disc protrusion. Moderate facet hypertrophy and ligamentum flavum thickening. Mild central spinal canal narrowing. Mild left than right neural foraminal narrowing.     L5-S1: Mild circumferential disc bulge. Mild bilateral facet hypertrophy and ligamentum flavum thickening. No central canal stenosis. Mild bilateral neural foraminal narrowing.      The paraspinal tissues are within normal limits.     For the purposes of this dictation, the lowest well formed intervertebral disc space is assumed to be the L5-S1 level, and there are presumed to be five lumbar-type vertebral bodies.    IMPRESSION:  Multilevel degenerative lumbar spondylosis resulting in at most mild canal or central canal stenosis. Overall, findings are not significantly changed when compared to prior MRI from 2017.    Procedures  Bilateral Intra-articular Lumbar Facet Injections under Fluoroscopic Guidance at L4/5 and L5/S1with steroid 12/29/20      Allergies  Allergies   Allergen Reactions    Vilazodone Swelling    Ace Inhibitors Other (See Comments)     Pt states she can not take ace inhibitors. Pt states she can not remember her reaction.     Bee Pollen Itching     COUGH, WATERY EYES    Penicillin G Rash    Penicillins Rash    Pollen Extracts Itching     COUGHING, WATERY EYES       Home Medications    Current Outpatient Medications   Medication Sig Dispense Refill    albuterol HFA 90 mcg/actuation inhaler Inhale 2 puffs every six (6) hours as needed for wheezing. 8 g 11    amLODIPine (NORVASC) 2.5 MG tablet Take 3 tablets (7.5 mg total) by mouth daily.      aspirin (ECOTRIN) 81 MG tablet Take 1 tablet (81 mg total) by mouth daily.      atenolol (TENORMIN) 25 MG tablet TAKE 1 AND 1/2 TABLETS BY MOUTH  TWICE DAILY 300 tablet 1    atorvastatin (LIPITOR) 40 MG tablet Take 1 tablet (40 mg total) by mouth daily.  biotin 1 mg cap Take by mouth.      buPROPion (WELLBUTRIN XL) 300 MG 24 hr tablet Take 1 tablet (300 mg total) by mouth daily. 90 tablet 3    clonazePAM (KLONOPIN) 0.5 MG tablet Take 0.5 tablets (0.25 mg total) by mouth Three (3) times a day as needed for anxiety. 30 tablet 1    clotrimazole-betamethasone (LOTRISONE) 1-0.05 % cream Apply 1 Application topically two (2) times a day.      diclofenac sodium (VOLTAREN) 1 % gel Apply 2 g topically two (2) times a day as needed for arthritis. 300 g 5    dicyclomine (BENTYL) 20 mg tablet Take 1 tablet (20 mg total) by mouth daily as needed.      famotidine (PEPCID) 20 MG tablet TAKE 1 TABLET BY MOUTH TWICE  DAILY 200 tablet 2    fluticasone propionate (FLONASE) 50 mcg/actuation nasal spray       hydrALAZINE (APRESOLINE) 10 MG tablet       hydroxychloroquine (PLAQUENIL) 200 mg tablet Take 1 tablet (200 mg total) by mouth two (2) times a day. 180 tablet 3    hydrOXYzine (ATARAX) 10 MG tablet       inhalational spacing device (AEROCHAMBER MV) Spcr 1 each by Miscellaneous route two (2) times a day. With Fovent 1 each 0    lamoTRIgine (LAMICTAL) 200 MG tablet TAKE 1 TABLET BY MOUTH TWICE  DAILY 200 tablet 2    lidocaine (XYLOCAINE) 5 % ointment Apply topically two (2) times a day. 100 g 2    linaCLOtide (LINZESS) 290 mcg capsule Take 1 capsule (290 mcg total) by mouth daily. 30 capsule 5    loratadine (CLARITIN) 10 mg tablet Take 1 tablet (10 mg total) by mouth daily.      melatonin 10 mg Tab Take 1 tablet by mouth.      metFORMIN (GLUCOPHAGE-XR) 500 MG 24 hr tablet Take 1 tablet (500 mg total) by mouth daily before breakfast.      mupirocin (BACTROBAN) 2 % ointment APP EXT IEN BID  0    mycophenolate (MYFORTIC) 360 MG TbEC Take 2 tablets (720 mg total) by mouth two (2) times a day. 360 tablet 3    olopatadine (PATANOL) 0.1 % ophthalmic solution Apply 1 drop to eye daily.      omeprazole (PRILOSEC) 40 MG capsule Take 1 capsule (40 mg total) by mouth two (2) times a day. 180 capsule 3    ondansetron (ZOFRAN) 4 MG tablet       polyethylene glycol (GLYCOLAX) 17 gram/dose powder Take 17 g by mouth daily.      predniSONE (DELTASONE) 1 MG tablet Take 4 tablets (4 mg total) by mouth daily for 30 days, THEN 3 tablets (3 mg total) daily for 30 days, THEN 2 tablets (2 mg total) daily for 30 days, THEN 1 tablet (1 mg total) daily. 300 tablet 0    [START ON 05/25/2023] predniSONE (DELTASONE) 1 MG tablet Take 2.5 tablets (2.5 mg total) by mouth daily. To start once prednisone taper has been completed (should be on/around 05/25/23) 60 tablet 0    telmisartan (MICARDIS) 80 MG tablet Take 1 tablet (80 mg total) by mouth.      traZODone (DESYREL) 100 MG tablet Take 1.5 tablets (150 mg total) by mouth nightly. 135 tablet 3    cyclobenzaprine (FLEXERIL) 5 MG tablet Take 1 tablet (5 mg total) by mouth two (2) times a day as needed for muscle spasms. 180 tablet 1  lidocaine (XYLOCAINE) 5 % ointment Apply topically four (4) times a day. 480 g 0    pregabalin (LYRICA) 75 MG capsule Take 1 capsule (75 mg total) by mouth two (2) times a day. 180 capsule 0     No current facility-administered medications for this visit.     ROS  See above questionnaire.    Physical Exam    VITALS:   Vitals:    05/24/23 0809   BP: 114/73   Pulse: 60   Resp: 16   Temp: 36.3 ??C (97.4 ??F)   SpO2: 96%       Wt Readings from Last 3 Encounters:   05/24/23 90.2 kg (198 lb 12.8 oz)   05/18/23 90.4 kg (199 lb 3.2 oz)   05/09/23 89.9 kg (198 lb 1.6 oz)     GENERAL:  The patient is well developed, well-nourished, and appears to be in no apparent distress.   HEAD/NECK:    Normocephalic/atraumatic. clear sclera, pupils not pinpoint  CV:  Warm and well perfused   LUNGS:   Normal work of breathing, no supplemental O2  EXTREMITIES:  No clubbing, cyanosis noted.  NEUROLOGIC:    The patient is alert and oriented, speech fluent, normal language.   MUSCULOSKELETAL:    Motor function  preserved.   GAIT:  The patient rises from a seated position with no difficulty and ambulates with nonantalgic gait without the assistance of a walking aid.   SKIN:   No obvious rashes, lesions, or erythema.  PSY:   Appropriate affect. No overt pain behaviors. No evidence of psychomotor retardation or agitation, no signs of intoxication.     Documentation assistance was provided by Marina Goodell Scribe, on May 24, 2023 at 11:42 AM for Galen Daft, NP.    We are delivering comprehensive, continuous, longitudinal care for this patient with chronic pain.   May 24, 2023 12:44 PM. Documentation assistance provided by the Scribe. I was present during the time the encounter was recorded. The information recorded by the Scribe was done at my direction and has been reviewed and validated by me. Rhina Brackett, FNP-C

## 2023-05-24 ENCOUNTER — Ambulatory Visit: Admit: 2023-05-24 | Discharge: 2023-05-25 | Payer: MEDICARE

## 2023-05-24 DIAGNOSIS — M797 Fibromyalgia: Secondary | ICD-10-CM | POA: Diagnosis not present

## 2023-05-24 DIAGNOSIS — M48061 Spinal stenosis, lumbar region without neurogenic claudication: Secondary | ICD-10-CM | POA: Diagnosis not present

## 2023-05-24 DIAGNOSIS — M5136 Other intervertebral disc degeneration, lumbar region: Secondary | ICD-10-CM | POA: Diagnosis not present

## 2023-05-24 DIAGNOSIS — M159 Polyosteoarthritis, unspecified: Secondary | ICD-10-CM | POA: Diagnosis not present

## 2023-05-24 DIAGNOSIS — G894 Chronic pain syndrome: Secondary | ICD-10-CM | POA: Diagnosis not present

## 2023-05-24 MED ORDER — CYCLOBENZAPRINE 5 MG TABLET
ORAL_TABLET | Freq: Two times a day (BID) | ORAL | 1 refills | 90 days | Status: CP | PRN
Start: 2023-05-24 — End: ?

## 2023-05-24 MED ORDER — PREGABALIN 75 MG CAPSULE
ORAL_CAPSULE | Freq: Two times a day (BID) | ORAL | 0 refills | 90 days | Status: CP
Start: 2023-05-24 — End: 2023-08-22

## 2023-05-24 MED ORDER — LIDOCAINE 5 % TOPICAL OINTMENT
Freq: Four times a day (QID) | TOPICAL | 0 refills | 0 days | Status: CP
Start: 2023-05-24 — End: 2023-08-22

## 2023-05-24 NOTE — Unmapped (Addendum)
It was good to see you today.    Increase Lyrica to 75 mg twice daily.    I have refilled your other medications with no changes.    We will try a Zynex machine for your neuropathy pain.    You can also try alpha-lipoic acid and vitamin B supplements over-the-counter for neuropathy pain if you like.    I have ordered an x-ray for your low back. Once you have this completed, we will plan for a nerve block for your low back pain. If this is helpful, we will move forward with an ablation (neurotomy). See below for more information on this procedure.    We will see you in 3 months, or sooner if needed.

## 2023-05-24 NOTE — Unmapped (Signed)
Faxed IFC/TENS Rx along with office visit note and registration to [facility] at [phone]  Original Rx to be scanned to media tab

## 2023-05-25 MED ORDER — PREDNISONE 1 MG TABLET
ORAL_TABLET | Freq: Every day | ORAL | 0 refills | 24.00000 days | Status: CP
Start: 2023-05-25 — End: ?

## 2023-05-25 NOTE — Unmapped (Signed)
Pt called stating she missed a call form Jasmine December. We do not have anyone by this name in clinic.

## 2023-05-26 NOTE — Unmapped (Signed)
ERROR

## 2023-05-30 NOTE — Unmapped (Signed)
This was a service where resident and the patient met via video.  As the attending physician, I spent 8 minutes in medical discussion with the patient via real-time audio and video, participating in the key portions of the service.I spent an additional 5 minutes on pre- and post-visit activities which were specific to the patient and included reviewing the patient???s medical records, lab results, imaging results, and other pertinent records. I reviewed the resident's note and I agree with the resident's findings and plan. Orpha Bur Zyiere Rosemond, MD

## 2023-05-31 DIAGNOSIS — M19019 Primary osteoarthritis, unspecified shoulder: Secondary | ICD-10-CM | POA: Diagnosis not present

## 2023-05-31 DIAGNOSIS — G629 Polyneuropathy, unspecified: Secondary | ICD-10-CM | POA: Diagnosis not present

## 2023-05-31 DIAGNOSIS — M170 Bilateral primary osteoarthritis of knee: Secondary | ICD-10-CM | POA: Diagnosis not present

## 2023-06-02 ENCOUNTER — Ambulatory Visit
Admit: 2023-06-02 | Discharge: 2023-07-01 | Payer: MEDICARE | Attending: Rehabilitative and Restorative Service Providers" | Primary: Rehabilitative and Restorative Service Providers"

## 2023-06-02 ENCOUNTER — Ambulatory Visit: Admit: 2023-06-02 | Payer: MEDICARE

## 2023-06-02 DIAGNOSIS — R262 Difficulty in walking, not elsewhere classified: Secondary | ICD-10-CM | POA: Diagnosis not present

## 2023-06-02 DIAGNOSIS — R2689 Other abnormalities of gait and mobility: Secondary | ICD-10-CM | POA: Diagnosis not present

## 2023-06-02 DIAGNOSIS — M6281 Muscle weakness (generalized): Secondary | ICD-10-CM | POA: Diagnosis not present

## 2023-06-02 DIAGNOSIS — E1142 Type 2 diabetes mellitus with diabetic polyneuropathy: Secondary | ICD-10-CM | POA: Diagnosis not present

## 2023-06-02 DIAGNOSIS — I10 Essential (primary) hypertension: Principal | ICD-10-CM

## 2023-06-02 DIAGNOSIS — Z6837 Body mass index (BMI) 37.0-37.9, adult: Principal | ICD-10-CM

## 2023-06-02 DIAGNOSIS — R801 Persistent proteinuria, unspecified: Principal | ICD-10-CM

## 2023-06-02 DIAGNOSIS — E669 Obesity, unspecified: Principal | ICD-10-CM
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
## 2023-06-02 NOTE — Unmapped (Signed)
Patient was notified of operational disruptions. Patient opted to: {Blank:[id number]::schedule their refill with understanding of potential delay until 10/1 or later.,transfer to another pharmacy. This was facilitated by pharmacy staff     [name] Refill Coordination Note    Caitlin Smith, DOB: 17-Jan-1956  Phone: [phone] (home)       All above HIPAA information was verified with patient.         05/29/2023    11:13 PM   Specialty Rx Medication Refill Questionnaire   Which Medications would you like refilled and shipped? Mycophenolic Acid 360 mg   Please list all current allergies: Penicillin   Have you missed any doses in the last 30 days? No   Have you had any changes to your medication(s) since your last refill? No   How many days remaining of each medication do you have at home? Maybe two weeks   Have you experienced any side effects in the last 30 days? No   Please enter the full address (street address, city, state, zip code) where you would like your medication(s) to be delivered to. [address] [place] [address]   Please specify on which day you would like your medication(s) to arrive. Note: if you need your medication(s) within 3 days, please call the pharmacy to schedule your order at [phone]  06/02/2023   Has your insurance changed since your last refill? No   Would you like a pharmacist to call you to discuss your medication(s)? No   Do you require a signature for your package? (Note: if we are billing Medicare Part B or your order contains a controlled substance, we will require a signature) No   Additional Comments: Thanks for reminding me.         Completed refill call assessment today to schedule patient's medication shipment from the [facility] [phone]).  All relevant notes have been reviewed.       Confirmed patient received a Conservation officer, historic buildings and a Surveyor, mining with first shipment. The patient will receive a drug information handout for each medication shipped and additional FDA Medication Guides as required.         REFERRAL TO PHARMACIST     Referral to the pharmacist: Not needed      [facility]     Shipping address confirmed in Epic.     Delivery Scheduled: Yes, Expected medication delivery date: 06/05/23.     Medication will be delivered via Same Day Courier to the prescription address in Epic WAM.    Craige Cotta   [facility] Specialty Technician

## 2023-06-03 ENCOUNTER — Other Ambulatory Visit: Payer: Self-pay | Admitting: Family Medicine

## 2023-06-03 DIAGNOSIS — I10 Essential (primary) hypertension: Secondary | ICD-10-CM

## 2023-06-06 ENCOUNTER — Ambulatory Visit: Admit: 2023-06-06 | Discharge: 2023-06-07 | Payer: MEDICARE

## 2023-06-06 DIAGNOSIS — I10 Essential (primary) hypertension: Secondary | ICD-10-CM | POA: Diagnosis not present

## 2023-06-06 DIAGNOSIS — R801 Persistent proteinuria, unspecified: Secondary | ICD-10-CM | POA: Diagnosis not present

## 2023-06-06 LAB — URINALYSIS WITH MICROSCOPY
BACTERIA: NONE SEEN /HPF
BILIRUBIN UA: NEGATIVE
BLOOD UA: NEGATIVE
GLUCOSE UA: NEGATIVE
KETONES UA: NEGATIVE
LEUKOCYTE ESTERASE UA: NEGATIVE
NITRITE UA: NEGATIVE
PH UA: 5.5 (ref 5.0–9.0)
PROTEIN UA: 50 — AB
RBC UA: <1 /HPF (ref <=4)
SPECIFIC GRAVITY UA: 1.024 (ref 1.003–1.030)
SQUAMOUS EPITHELIAL: <1 /HPF (ref 0–5)
UROBILINOGEN UA: <2
WBC UA: 1 /HPF (ref 0–5)

## 2023-06-06 LAB — RENAL FUNCTION PANEL
ALBUMIN: 3.6 g/dL (ref 3.4–5.0)
ANION GAP: 7 mmol/L (ref 5–14)
BLOOD UREA NITROGEN: 7 mg/dL — ABNORMAL LOW (ref 9–23)
BUN / CREAT RATIO: 8
CALCIUM: 9.5 mg/dL (ref 8.7–10.4)
CHLORIDE: 105 mmol/L (ref 98–107)
CO2: 31 mmol/L (ref 20.0–31.0)
CREATININE: 0.9 mg/dL
EGFR CKD-EPI (2021) FEMALE: 71 mL/min/{1.73_m2} (ref >=60)
GLUCOSE RANDOM: 81 mg/dL (ref 70–179)
PHOSPHORUS: 3.5 mg/dL (ref 2.4–5.1)
POTASSIUM: 4.6 mmol/L (ref 3.4–4.8)
SODIUM: 143 mmol/L (ref 135–145)

## 2023-06-06 LAB — PROTEIN / CREATININE RATIO, URINE
CREATININE, URINE: 223.7 mg/dL
PROTEIN URINE: 47.2 mg/dL
PROTEIN/CREAT RATIO, URINE: 0.211

## 2023-06-08 ENCOUNTER — Ambulatory Visit: Admit: 2023-06-08 | Discharge: 2023-06-27 | Payer: MEDICARE

## 2023-06-08 ENCOUNTER — Ambulatory Visit: Admit: 2023-06-08 | Discharge: 2023-06-09 | Payer: MEDICARE | Attending: Nephrology | Primary: Nephrology

## 2023-06-08 ENCOUNTER — Ambulatory Visit: Admit: 2023-06-08 | Payer: MEDICARE

## 2023-06-08 DIAGNOSIS — M6289 Other specified disorders of muscle: Secondary | ICD-10-CM | POA: Diagnosis not present

## 2023-06-08 DIAGNOSIS — R52 Pain, unspecified: Secondary | ICD-10-CM | POA: Diagnosis not present

## 2023-06-08 DIAGNOSIS — I10 Essential (primary) hypertension: Secondary | ICD-10-CM | POA: Diagnosis not present

## 2023-06-08 DIAGNOSIS — K5904 Chronic idiopathic constipation: Secondary | ICD-10-CM | POA: Diagnosis not present

## 2023-06-08 DIAGNOSIS — M329 Systemic lupus erythematosus, unspecified: Secondary | ICD-10-CM | POA: Diagnosis not present

## 2023-06-08 MED ORDER — TELMISARTAN 80 MG TABLET
ORAL_TABLET | Freq: Every day | ORAL | 3 refills | 90 days | Status: CP
Start: 2023-06-08 — End: ?

## 2023-06-08 MED ORDER — AMLODIPINE 2.5 MG TABLET
ORAL_TABLET | ORAL | 3 refills | 30 days | Status: CP
Start: 2023-06-08 — End: ?
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
## 2023-06-13 ENCOUNTER — Emergency Department: Payer: 59

## 2023-06-13 ENCOUNTER — Other Ambulatory Visit: Payer: Self-pay

## 2023-06-13 ENCOUNTER — Emergency Department
Admission: EM | Admit: 2023-06-13 | Discharge: 2023-06-13 | Disposition: A | Discharge status: Home / Self Care | Payer: 59 | Attending: Emergency Medicine | Admitting: Emergency Medicine

## 2023-06-13 DIAGNOSIS — R0789 Other chest pain: Secondary | ICD-10-CM | POA: Insufficient documentation

## 2023-06-13 DIAGNOSIS — N189 Chronic kidney disease, unspecified: Secondary | ICD-10-CM | POA: Insufficient documentation

## 2023-06-13 DIAGNOSIS — R0989 Other specified symptoms and signs involving the circulatory and respiratory systems: Secondary | ICD-10-CM | POA: Diagnosis not present

## 2023-06-13 DIAGNOSIS — J45909 Unspecified asthma, uncomplicated: Secondary | ICD-10-CM | POA: Insufficient documentation

## 2023-06-13 DIAGNOSIS — Z1152 Encounter for screening for COVID-19: Secondary | ICD-10-CM | POA: Insufficient documentation

## 2023-06-13 DIAGNOSIS — R079 Chest pain, unspecified: Secondary | ICD-10-CM | POA: Diagnosis not present

## 2023-06-13 LAB — CBC
HCT: 41.2 % (ref 36.0–46.0)
Hemoglobin: 13.3 g/dL (ref 12.0–15.0)
MCH: 27.6 pg (ref 26.0–34.0)
MCHC: 32.3 g/dL (ref 30.0–36.0)
MCV: 85.5 fL (ref 80.0–100.0)
Platelets: 276 10*3/uL (ref 150–400)
RBC: 4.82 MIL/uL (ref 3.87–5.11)
RDW: 13.2 % (ref 11.5–15.5)
WBC: 4.3 10*3/uL (ref 4.0–10.5)
nRBC: 0 % (ref 0.0–0.2)

## 2023-06-13 LAB — RESP PANEL BY RT-PCR (RSV, FLU A&B, COVID)  RVPGX2
Influenza A by PCR: NEGATIVE
Influenza B by PCR: NEGATIVE
Resp Syncytial Virus by PCR: NEGATIVE
SARS Coronavirus 2 by RT PCR: NEGATIVE

## 2023-06-13 LAB — TROPONIN I (HIGH SENSITIVITY)
Troponin I (High Sensitivity): 4 ng/L (ref <18)
Troponin I (High Sensitivity): 4 ng/L (ref <18)

## 2023-06-13 LAB — BASIC METABOLIC PANEL
Anion gap: 8 (ref 5–15)
BUN: 11 mg/dL (ref 8–23)
CO2: 29 mmol/L (ref 22–32)
Calcium: 9.1 mg/dL (ref 8.9–10.3)
Chloride: 100 mmol/L (ref 98–111)
Creatinine, Ser: 0.94 mg/dL (ref 0.44–1.00)
GFR, Estimated: >60 mL/min (ref >60)
Glucose, Bld: 102 mg/dL — ABNORMAL HIGH (ref 70–99)
Potassium: 4 mmol/L (ref 3.5–5.1)
Sodium: 137 mmol/L (ref 135–145)

## 2023-06-13 LAB — D-DIMER, QUANTITATIVE: D-Dimer, Quant: 0.41 ug{FEU}/mL (ref 0.00–0.50)

## 2023-06-13 MED ORDER — ASPIRIN 81 MG PO CHEW
324.0000 mg | CHEWABLE_TABLET | Freq: Once | ORAL | Status: AC
  Administered 2023-06-13: 324 mg via ORAL
  Filled 2023-06-13: qty 4

## 2023-06-13 NOTE — ED Provider Notes (Signed)
[facility] Provider Note    Event Date/Time   First MD Initiated Contact with Patient 06/13/23 1358     (approximate)   History   Chest Pain   HPI  Nicole Ballard is a 67 y.o. female history of pneumonitis, dyspnea, asthma, chronic kidney disease, depression, pulmonary fibrosis  For 2 days now patient has been experiencing a mild discomfort under her breastbone.  She also reports is a slight cough with it.  No nausea or vomiting.  No fevers or chills.  No leg swelling.  No history of blood clots.  No abdominal pain.  Patient reports that she has had a slight cough and achiness below her breastbone.  She does have a history of fibrosis.  Denies wheezing or shortness of breath   2 months ago had E. coli bacteremia  Physical Exam   Triage Vital Signs: ED Triage Vitals  Encounter Vitals Group     BP 06/13/23 1334 (!) 146/78     Systolic BP Percentile --      Diastolic BP Percentile --      Pulse Rate 06/13/23 1334 60     Resp 06/13/23 1334 18     Temp 06/13/23 1334 98.6 F (37 C)     Temp src --      SpO2 06/13/23 1334 98 %     Weight 06/13/23 1331 195 lb (88.5 kg)     Height 06/13/23 1331 5\' 7"  (1.702 m)     Head Circumference --      Peak Flow --      Pain Score 06/13/23 1331 7     Pain Loc --      Pain Education --      Exclude from Growth Chart --     Most recent vital signs: Vitals:   06/13/23 1334  BP: (!) 146/78  Pulse: 60  Resp: 18  Temp: 98.6 F (37 C)  SpO2: 98%     General: Awake, no distress.  She is very pleasant CV:  Good peripheral perfusion.  Normal tones and rate Resp:  Normal effort.  Clear bilaterally.  No noted rales or wheezing.  No crackles noted, no obvious findings to suggest fibrosis by my auscultation. Abd:  No distention.  Soft nontender nondistended.  No Murphy.  Negative for pain across the abdomen bilateral.  No epigastric tenderness Other:  Mild varicosity of veins in both lower extremities, no  unilateral edema, no calf tenderness.  No large venous cords   ED Results / Procedures / Treatments   Labs (all labs ordered are listed, but only abnormal results are displayed) Labs Reviewed  BASIC METABOLIC PANEL - Abnormal; Notable for the following components:      Result Value   Glucose, Bld 102 (*)    All other components within normal limits  RESP PANEL BY RT-PCR (RSV, FLU A&B, COVID)  [id number]  CBC  D-DIMER, QUANTITATIVE  TROPONIN I (HIGH SENSITIVITY)  TROPONIN I (HIGH SENSITIVITY)     EKG  ED enterotomy at 1330 heart rate 60 QRS 90 QTc 400.  Slight baseline artifact.  Normal sinus rhythm, suspect left ventricular hypertrophy.  Nonspecific T wave abnormality.  No obvious frank ischemia, though T wave inversions are present in the lateral precordial leads, and slight repolarization Abrol slight elevation noted in 3 and aVF felt to be repolarization like change.  Compared with EKG from October 10, of last year, T wave abnormalities are now present though questionable if this  may be artifactual   Repeat EKG interpreted by me as less artifact, ECG segments do not show evidence of acute ischemia   RADIOLOGY  Chest x-ray interpreted by me as negative for acute  DG Chest 2 View  Result Date: 06/13/2023 CLINICAL DATA:  Right-sided chest pain. EXAM: CHEST - 2 VIEW COMPARISON:  April 13, 2022 FINDINGS: The heart size and mediastinal contours are within normal limits. Low lung volumes are noted. There is no evidence of an acute infiltrate, pleural effusion or pneumothorax. Radiopaque surgical clips are seen within the right upper quadrant. Multilevel degenerative changes are seen throughout the thoracic spine. IMPRESSION: Low lung volumes without acute or active cardiopulmonary disease. Electronically Signed   By: Aram Candela M.D.   On: 06/13/2023 15:13      PROCEDURES:  Critical Care performed: No  Procedures   MEDICATIONS ORDERED IN ED: Medications  aspirin chewable  tablet 324 mg (324 mg Oral Given 06/13/23 1427)     IMPRESSION / MDM / ASSESSMENT AND PLAN / ED COURSE  I reviewed the triage vital signs and the nursing notes.                              Differential diagnosis includes, but is not limited to, ACS, aortic dissection, pulmonary embolism, cardiac tamponade, pneumothorax, pneumonia, pericarditis, myocarditis, GI-related causes including esophagitis/gastritis, and musculoskeletal chest wall pain.    She describes a slight cough with an achy discomfort below the sternum.  ECG on repeat with less artifact does not demonstrate any concerning findings.  Her initial troponin normal.  Symptoms atypical of ACS.  Will obtain second troponin, if this is negative I would highly doubt ACS.  The patient does however have findings concerning for left ventricular hypertrophy, repolarization abnormality, and I think she may benefit from repeat cardiology evaluation and echocardiogram moving forward though it may not be directly related to today's complaint.  Discussed with the patient she would be amenable of close follow-up with cardiology and I have placed a urgent referral for this  Her symptoms seem to suggest a mild cough but no hypoxia no wheezing no obvious evidence of exacerbation of underlying fibrosis.  Will check COVID test as well.  She is low risk without history of DVT PE, or signs or symptoms highly suggestive.  Last travel history was traveling to [place] with family.   D-dimer exclusionary for PE in the setting of low risk  Patient's presentation is most consistent with acute complicated illness / injury requiring diagnostic workup.   The patient is on the cardiac monitor to evaluate for evidence of arrhythmia and/or significant heart rate changes.  Labs CBC metabolic panel normal  Ongoing care assigned to Dr. Vicente Males with plan to follow-up on repeat troponin and results of COVID testing.  If patient remains well with reassuring pending  testing anticipate discharge to follow-up closely with PCP and cardiology with careful return precautions regarding chest pain      FINAL CLINICAL IMPRESSION(S) / ED DIAGNOSES   Final diagnoses:  Atypical chest pain     Rx / DC Orders   ED Discharge Orders          Ordered    Ambulatory referral to Cardiology       Comments: If you have not heard from the Cardiology office within the next 72 hours please call [phone].   06/13/23 1540  Note:  This document was prepared using Dragon voice recognition software and may include unintentional dictation errors.   Sharyn Creamer, MD 06/13/23 1610

## 2023-06-13 NOTE — ED Triage Notes (Signed)
Pt presents to ED with c/o of midsternum CP that started 2 days ago. Pt also endorses cough. Pt denies fevers or chills. NAD noted.

## 2023-06-13 NOTE — ED Notes (Signed)
See triage note  Presents with chest pain  States pain as been intermittent for several days  Denies any fever   cough or n/v

## 2023-06-13 NOTE — ED Provider Notes (Signed)
Emergency department handoff note  Care of this patient was signed out to me at the end of the previous provider shift.  All pertinent patient information was conveyed and all questions were answered.  Patient pending repeat troponin that was not elevated and a urinalysis that did not show any evidence of hematuria or signs of infection.  The patient has been reexamined and is ready to be discharged.  All diagnostic results have been reviewed and discussed with the patient/family.  Care plan has been outlined and the patient/family understands all current diagnoses, results, and treatment plans.  There are no new complaints, changes, or physical findings at this time.  All questions have been addressed and answered.  Patient was instructed to, and agrees to follow-up with their primary care physician as well as return to the emergency department if any new or worsening symptoms develop.   Merwyn Katos, MD 06/13/23 [phone]

## 2023-06-14 ENCOUNTER — Encounter: Payer: Self-pay | Admitting: Family Medicine

## 2023-06-15 NOTE — Progress Notes (Signed)
Cardiology Office Note  Date:  06/16/2023   ID:  Nicole Ballard, DOB [birth year], MRN [id number]  PCP:  Alba Cory, MD   Chief Complaint  Patient presents with   New Patient (Initial Visit)    Ref by Dr. Fanny Bien for chest pain. Patient c/o chest pain, shortness of breath and palpitations. Medications reviewed by the patient verbally.     HPI:  [name] is a 67 year old woman with past medical history of Interstitial lung disease/asthma/pulmonary fibrosis, "8 years" PVCs Who presents by referral from Dr. Fanny Bien for chest pain  Several trips to the emergency room over the past year June 13, 2019 for chest pain August second 2024 back pain, urinary frequency April 12 2022 chest pain April 05, 2022 chest pain  On further discussion today she reports having Chest pain >1 week, "uncomfortable feeling" all day, sometimes through to back and neck Increased SOB, she is uncertain if this is from her underlying asthma CXR reviewed in detail: Multilevel degenerative changes are seen throughout the thoracic spine. Lab work reviewed, troponin negative  Previously seen by [facility] cardiology November 2023 for PVCs Treated with atenolol 25 twice daily  Ziopatch showed no significant arrhythmias. Symptoms correlated with SR or VE (<1% burden)  Echo in December 2022 showed structurally normal heart.  CT angiogram performed in May 2021 showed no evidence of coronary calcification,   Echocardiogram December 2022 normal EF, normal RV size and function   EKG personally reviewed by myself on todays visit EKG Interpretation Date/Time:  Friday June 16 2023 10:45:03 EDT Ventricular Rate:  59 PR Interval:  162 QRS Duration:  84 QT Interval:  392 QTC Calculation: 388 R Axis:   -1  Text Interpretation: Sinus bradycardia When compared with ECG of 13-Jun-2023 14:26, No significant change was found Confirmed by Julien Nordmann [id number]) on 06/16/2023 10:56:37 AM    PMH:   has a past  medical history of Anxiety, Bipolar 1 disorder (HCC), Chronic kidney disease, Chronic pain, Diabetes mellitus without complication (HCC), Fibromyalgia, GERD (gastroesophageal reflux disease), Hearing loss of right ear, Hemifacial spasm, Hyperlipidemia, Hypertension, Insomnia, Lumbago, Lung disease, Osteoarthritis, Paresthesia, Rhinitis, Systemic lupus erythematosus (HCC), and Vitamin D deficiency.  PSH:    Past Surgical History:  Procedure Laterality Date   BRAIN SURGERY     CHOLECYSTECTOMY     VAGINAL HYSTERECTOMY     vulvar surgery      Current Outpatient Medications  Medication Sig Dispense Refill   albuterol (VENTOLIN HFA) 108 (90 Base) MCG/ACT inhaler SMARTSIG:2 Puff(s) By Mouth Every 6 Hours PRN     amLODipine (NORVASC) 2.5 MG tablet TAKE 3 TABLETS BY MOUTH DAILY 90 tablet 0   aspirin EC 81 MG tablet Take 1 tablet (81 mg total) by mouth daily. 30 tablet 0   atenolol (TENORMIN) 25 MG tablet Take 1 tablet by mouth in the morning and at bedtime.     atorvastatin (LIPITOR) 40 MG tablet TAKE 1 TABLET BY MOUTH DAILY 30 tablet 0   Biotin 1 MG CAPS Take 1 capsule by mouth daily. 30 capsule 0   buPROPion (WELLBUTRIN XL) 300 MG 24 hr tablet Take 300 mg by mouth in the morning.     Cholecalciferol (VITAMIN D) 2000 UNITS CAPS Take 1 capsule by mouth daily.     clobetasol ointment (TEMOVATE) 0.05 % Apply 1 application topically 2 (two) times daily.     clonazePAM (KLONOPIN) 0.5 MG tablet Take 0.25 mg by mouth at bedtime.     clotrimazole-betamethasone (LOTRISONE)  cream Apply 1 application topically 2 (two) times daily. 45 g 0   cyclobenzaprine (FLEXERIL) 5 MG tablet Take by mouth.     diclofenac Sodium (VOLTAREN) 1 % GEL Apply topically daily as needed.     famotidine (PEPCID) 20 MG tablet Take 20 mg by mouth daily.     fluticasone (FLONASE) 50 MCG/ACT nasal spray Place 2 sprays into both nostrils daily. 48 g 3   hydroxychloroquine (PLAQUENIL) 200 MG tablet Take by mouth 2 (two) times daily.      hydrOXYzine (ATARAX) 10 MG tablet Take 1 tablet (10 mg total) by mouth 3 (three) times daily as needed. 30 tablet 0   lamoTRIgine (LAMICTAL) 200 MG tablet Take 400 mg by mouth at bedtime.     LINZESS 145 MCG CAPS capsule Take 145 mcg by mouth daily.     loratadine (CLARITIN) 10 MG tablet Take 1 tablet (10 mg total) by mouth daily. 90 tablet 1   Melatonin 10 MG TABS Take 1 tablet by mouth at bedtime.     metFORMIN (GLUCOPHAGE-XR) 500 MG 24 hr tablet TAKE 1 TABLET BY MOUTH DAILY  WITH BREAKFAST 100 tablet 2   mycophenolate (MYFORTIC) 360 MG TBEC EC tablet Take 720 mg by mouth 2 (two) times daily.     olopatadine (PATANOL) 0.1 % ophthalmic solution Place 1 drop into both eyes 2 (two) times daily. 5 mL 1   omeprazole (PRILOSEC) 40 MG capsule Take 1 capsule by mouth in the morning and at bedtime. Pulmonologist     polyethylene glycol (MIRALAX) 17 g packet Take 17 g by mouth 2 (two) times daily as needed for moderate constipation. 20 each 0   predniSONE (DELTASONE) 1 MG tablet Take 1 mg by mouth daily with breakfast.     pregabalin (LYRICA) 75 MG capsule Take 75 mg by mouth daily.     telmisartan (MICARDIS) 80 MG tablet TAKE 1 TABLET BY MOUTH DAILY 30 tablet 0   traZODone (DESYREL) 100 MG tablet Take 150 mg by mouth at bedtime.     triamcinolone ointment (KENALOG) 0.1 %      predniSONE (DELTASONE) 2.5 MG tablet Take 2 tablets by mouth daily at 12 noon. (Patient not taking: Reported on 06/16/2023)     No current facility-administered medications for this visit.     Allergies:   Ace inhibitors, Bee pollen, Penicillins, and Pollen extract   Social History:  The patient  reports that she has never smoked. She has never used smokeless tobacco. She reports that she does not drink alcohol and does not use drugs.   Family History:   family history includes Breast cancer in her cousin, cousin, maternal aunt, maternal aunt, maternal aunt, and mother; Cancer in her maternal uncle and maternal uncle;  Diabetes in her mother; Hypertension in her mother; Stomach cancer in her father.    Review of Systems: Review of Systems  Constitutional: Negative.   HENT: Negative.    Respiratory: Negative.    Cardiovascular:  Positive for chest pain.  Gastrointestinal: Negative.   Musculoskeletal: Negative.   Neurological: Negative.   Psychiatric/Behavioral: Negative.    All other systems reviewed and are negative.    PHYSICAL EXAM: VS:  BP 122/72 (BP Location: Right Arm, Patient Position: Sitting, Cuff Size: Normal)   Pulse (!) 59   Ht 5\' 8"  (1.727 m)   Wt 193 lb 8 oz (87.8 kg)   SpO2 95%   BMI 29.42 kg/m  , BMI Body mass index is 29.42 kg/m. GEN:  Well nourished, well developed, in no acute distress HEENT: normal Neck: no JVD, carotid bruits, or masses Cardiac: RRR; no murmurs, rubs, or gallops,no edema  Respiratory:  clear to auscultation bilaterally, normal work of breathing GI: soft, nontender, nondistended, + BS MS: no deformity or atrophy Skin: warm and dry, no rash Neuro:  Strength and sensation are intact Psych: euthymic mood, full affect   Recent Labs: 10/24/2022: TSH 1.90 04/07/2023: ALT 17 06/13/2023: BUN 11; Creatinine, Ser 0.94; Hemoglobin 13.3; Platelets 276; Potassium 4.0; Sodium 137    Lipid Panel Lab Results  Component Value Date   CHOL 140 07/12/2022   HDL 71 07/12/2022   LDLCALC 56 07/12/2022   TRIG 44 07/12/2022      Wt Readings from Last 3 Encounters:  06/16/23 193 lb 8 oz (87.8 kg)  06/13/23 195 lb (88.5 kg)  04/10/23 198 lb 8 oz (90 kg)       ASSESSMENT AND PLAN:  Problem List Items Addressed This Visit     Interstitial lung disease (HCC) - Primary   Relevant Orders   EKG 12-Lead (Completed)   Basic Metabolic Panel (BMET)   Other Visit Diagnoses     Chest pain, unspecified type       Relevant Orders   CT CORONARY MORPH W/CTA COR W/SCORE W/CA W/CM &/OR WO/CM   Basic Metabolic Panel (BMET)   Angina pectoris (HCC)       Relevant Orders    CT CORONARY MORPH W/CTA COR W/SCORE W/CA W/CM &/OR WO/CM      Chest pain/angina Discussed symptoms, reports long history of stuttering chest pain Several trips to the emergency room for similar symptoms over the past 2 years Recommended further evaluation for underlying ischemia Cardiac CTA ordered She will follow-up with her primary cardiologist at [facility]  Interstitial lung disease Reports remote diagnosis 8 years ago approximately Reports symptoms are relatively stable Last chest CT scan May 2020  PVCs Denies significant symptoms, reports they are well-controlled on atenolol 25 twice daily Also has metoprolol succinate on her list which she is not taking, we will remove this    Signed, Dossie Arbour, M.D., Ph.D. [facility] [place], [place] [phone]

## 2023-06-16 ENCOUNTER — Ambulatory Visit: Payer: 59 | Attending: Cardiovascular Disease | Admitting: Cardiovascular Disease

## 2023-06-16 ENCOUNTER — Encounter: Payer: Self-pay | Admitting: Cardiovascular Disease

## 2023-06-16 ENCOUNTER — Telehealth: Admit: 2023-06-16 | Discharge: 2023-06-17 | Payer: MEDICARE | Attending: Psychologist | Primary: Psychologist

## 2023-06-16 VITALS — BP 122/72 | HR 59 | Ht 68.0 in | Wt 193.5 lb

## 2023-06-16 DIAGNOSIS — I209 Angina pectoris, unspecified: Secondary | ICD-10-CM | POA: Diagnosis not present

## 2023-06-16 DIAGNOSIS — J849 Interstitial pulmonary disease, unspecified: Secondary | ICD-10-CM | POA: Diagnosis not present

## 2023-06-16 DIAGNOSIS — R079 Chest pain, unspecified: Secondary | ICD-10-CM

## 2023-06-16 DIAGNOSIS — F331 Major depressive disorder, recurrent, moderate: Principal | ICD-10-CM

## 2023-06-16 DIAGNOSIS — F4310 Post-traumatic stress disorder, unspecified: Principal | ICD-10-CM

## 2023-06-16 DIAGNOSIS — F410 Panic disorder [episodic paroxysmal anxiety] without agoraphobia: Principal | ICD-10-CM

## 2023-06-16 DIAGNOSIS — F411 Generalized anxiety disorder: Principal | ICD-10-CM

## 2023-06-16 NOTE — Patient Instructions (Addendum)
Medication Instructions:  No changes  If you need a refill on your cardiac medications before your next appointment, please call your pharmacy.   Lab work: Your provider would like for you to have following labs drawn today BMET.    Testing/Procedures:   Your cardiac CT will be scheduled at one of the below locations:   [facility] [address] [place], [place] [address] [phone]  OR   [facility] [address] [place], [place] [address] [phone]  If scheduled at [facility] or [facility], please arrive 15 mins early for check-in and test prep.  There is spacious parking and easy access to the radiology department from the [facility] entrance. Please enter here and check-in with the desk attendant.   Please follow these instructions carefully (unless otherwise directed):  An IV will be required for this test and Nitroglycerin will be given.  Hold all erectile dysfunction medications at least 3 days (72 hrs) prior to test. (Ie viagra, cialis, sildenafil, tadalafil, etc)   On the Night Before the Test: Be sure to Drink plenty of water. Do not consume any caffeinated/decaffeinated beverages or chocolate 12 hours prior to your test. Do not take any antihistamines 12 hours prior to your test.  On the Day of the Test: Drink plenty of water until 1 hour prior to the test. Do not eat any food 1 hour prior to test. You may take your regular medications prior to the test.  Take 2 atenolol (TENORMIN) 25 MG tablet two hours prior to test. Hold evening dose of atenolol (TENORMIN) 25 MG tablet If you take Furosemide/Hydrochlorothiazide/Spironolactone, please HOLD on the morning of the test. FEMALES- please wear underwire-free bra if available, avoid dresses & tight clothing     After the Test: Drink plenty of water. After receiving IV  contrast, you may experience a mild flushed feeling. This is normal. On occasion, you may experience a mild rash up to 24 hours after the test. This is not dangerous. If this occurs, you can take Benadryl 25 mg and increase your fluid intake. If you experience trouble breathing, this can be serious. If it is severe call 911 IMMEDIATELY. If it is mild, please call our office. If you take any of these medications: Glipizide/Metformin, Avandament, Glucavance, please do not take 48 hours after completing test unless otherwise instructed.  We will call to schedule your test 2-4 weeks out understanding that some insurance companies will need an authorization prior to the service being performed.   For more information and frequently asked questions, please visit our website : [email or web address]  For non-scheduling related questions, please contact the cardiac imaging nurse navigator should you have any questions/concerns: Cardiac Imaging Nurse Navigators Direct Office Dial: [phone]   For scheduling needs, including cancellations and rescheduling, please call [place], [phone].   Follow-Up: At [facility], you and your health needs are our priority.  As part of our continuing mission to provide you with exceptional heart care, we have created designated Provider Care Teams.  These Care Teams include your primary Cardiologist (physician) and Advanced Practice Providers (APPs -  Physician Assistants and Nurse Practitioners) who all work together to provide you with the care you need, when you need it.  You will need a follow up appointment as needed  Providers on your designated Care Team:   Nicolasa Ducking, NP Eula Listen, PA-C Cadence Fransico Michael, [place]  COVID-19 Vaccine Information  can be found at: [email or web address] For questions related to vaccine distribution or appointments, please email [email or web address] or  call [phone].

## 2023-06-16 NOTE — Unmapped (Signed)
[facility]   Confidential Psychological Therapy Session      Patient Name: Caitlin Smith  Medical Record Number: [id number]  Date of Service: June 16, 2023  Attending Psychologist: Caroline More, PhD  CPT Procedure Codes: 81191 for 60 mins of face to face counseling    This visit was performed face to face with interactive technology using a HIPPA compliant audio/visual platform. We reviewed confidentiality today. The patient was present in [place], a state in which this provider is licensed and able to provide care (location and contact information confirmed), attended this visit alone, and consented to this virtual pain psychology visit.    REFERRING PHYSICIAN: Clarene Essex, MD    CHIEF COMPLAINT AND REASON FOR VISIT: pain coping skills, CBT to address depression and anxiety in the setting of chronic pain    SUBJECTIVE / HISTORY OF PRESENT ILLNESS: Caitlin Smith is a very pleasant 67 y.o.  female from [place], [place] with multiple chronic pain complaints related to fibromyalgia and lupus who initially met with me in October 2016, at which time she was diagnosed with severe depression, PTSD, panic disorder, and generalized anxiety. The patient returns for a therapy session today. Last follow up with me was in 04/2023.    The patient participates actively today.  Pt shares concerns related to recent ER visit, question abut blood cancer versus bacteremia infection (apparently discussed with her by ER doc) resulting in increased health related anxiety. Processed thoughts and feelings about this and how to approach, reviewed problem solving. Spent extra time reviewed health goals and planning - son to go to next visit with PCP. Awaiting cardiac follow up.     OBJECTIVE / MENTAL STATUS:    Appearance:   Appears stated age and Clean/Neat   Motor:  No abnormal movements   Speech/Language:   Normal rate, volume, tone, fluency   Mood:  Depressed and Anxious   Affect:  euthymic Thought process:  Logical, linear, clear, coherent, goal directed   Thought content:    Denies SI, HI, self harm, delusions, obsessions, paranoid ideation, or ideas of reference   Perceptual disturbances:    Denies auditory and visual hallucinations, behavior not concerning for response to internal stimuli   Orientation:  Oriented to person, place, time, and general circumstances   Attention:  Able to fully attend without fluctuations in consciousness   Concentration:  Able to fully concentrate and attend   Memory:  Immediate, short-term, long-term, and recall grossly intact    Fund of knowledge:   Consistent with level of education and development   Insight:    Fair   Judgment:   Intact   Impulse Control:  Intact     DIAGNOSTIC IMPRESSION:   Post Traumatic Stress Disorder (PTSD)  Generalized Anxiety Disorder (GAD)  Panic Disorder  Major Depressive Disorder, moderate, recurrent  Chronic pain syndrome  Fibromyalgia  Lupus    ASSESSMENT:   Caitlin Smith is a very pleasant 67 y.o.  female from [place], [place] with multiple chronic pain complaints related to lupus, arthritis, and fibromyalgia. She was previously seen in our clinic from 2012-2014, and reestablished care with Dr. Fayrene Fearing in August 2016. The patient has struggled with depression and anxiety, but is very motivated to participate in intensive, multidisciplinary treatment to address the connection between depression, anxiety and pain. She has a long-standing history of depression and anxiety, and has worked with outpatient psychiatrist and therapist over the years. She is currently established with [facility]  psychiatry.  In general, depression, anxiety, pain coping and panic have all improved (until recent bout with pulmonary issues), but the patient evidences a seasonal affective pattern of mood changes typically her mood tends to be worse during the winter.  Encouraged early morning sunlight and getting outside and walking as tolerated, building cardiovascular and muscular strength and endurance over time. Also encouraged practice of daily behavioral relaxation exercises as well.     Most recently has had neurological symptoms, and saw her neurologist, has a brain MRI scheduled for tomorrow.     PLAN:   (1) Psychotherapy - Continue CBT. CBT will be used to address PTSD, MDD, Panic D/o, GAD, and chronic pain.     --Behavioral Activation - Encouraged behavioral activation.  Is trying. Walking in AM.  --Downloaded and uses Insight Timer, guided relaxation app, for nightly practice.  --Continues to utilize diaphragmatic breathing daily  --encouraged early morning sunlight / light therapy for possible SAD in the setting of chronic MDD    (2) Psychiatry - Pt currently established with [facility].  Is followed by Dr. Evette Cristal (attending) but typically has appointments with a psychiatrist resident, Dr. Virl Cagey.    (3) Safety - Pt denies any current or recent SI or safety concerns. Knows to call 911 or go to her local ED. Also previously given [facility].

## 2023-06-17 LAB — BASIC METABOLIC PANEL
BUN/Creatinine Ratio: 7 — ABNORMAL LOW (ref 12–28)
BUN: 7 mg/dL — ABNORMAL LOW (ref 8–27)
CO2: 25 mmol/L (ref 20–29)
Calcium: 9.7 mg/dL (ref 8.7–10.3)
Chloride: 103 mmol/L (ref 96–106)
Creatinine, Ser: 0.97 mg/dL (ref 0.57–1.00)
Glucose: 77 mg/dL (ref 70–99)
Potassium: 4.8 mmol/L (ref 3.5–5.2)
Sodium: 143 mmol/L (ref 134–144)
eGFR: 65 mL/min/{1.73_m2} (ref >59)

## 2023-06-19 DIAGNOSIS — J8489 Other specified interstitial pulmonary diseases: Principal | ICD-10-CM

## 2023-06-19 MED ORDER — PREDNISONE 1 MG TABLET
ORAL_TABLET | Freq: Every day | ORAL | 0 refills | 24 days | Status: CP
Start: 2023-06-19 — End: ?

## 2023-06-20 ENCOUNTER — Telehealth (HOSPITAL_COMMUNITY): Payer: Self-pay | Admitting: Emergency Medicine

## 2023-06-20 MED ORDER — TRAZODONE 100 MG TABLET
ORAL_TABLET | 3 refills | 0 days
Start: 2023-06-20 — End: ?

## 2023-06-20 NOTE — Telephone Encounter (Signed)
Reaching out to patient to offer assistance regarding upcoming cardiac imaging study; pt verbalizes understanding of appt date/time, parking situation and where to check in, pre-test NPO status and medications ordered, and verified current allergies; name and call back number provided for further questions should they arise Cayne Yom RN Navigator Cardiac Imaging [facility] [phone] office [phone] cell 

## 2023-06-21 ENCOUNTER — Ambulatory Visit: Admit: 2023-06-21 | Discharge: 2023-06-22 | Payer: MEDICARE | Attending: Adult Health | Primary: Adult Health

## 2023-06-21 DIAGNOSIS — R072 Precordial pain: Secondary | ICD-10-CM | POA: Diagnosis not present

## 2023-06-21 DIAGNOSIS — R079 Chest pain, unspecified: Secondary | ICD-10-CM | POA: Diagnosis not present

## 2023-06-21 DIAGNOSIS — R002 Palpitations: Secondary | ICD-10-CM | POA: Diagnosis not present

## 2023-06-21 DIAGNOSIS — K219 Gastro-esophageal reflux disease without esophagitis: Secondary | ICD-10-CM | POA: Diagnosis not present

## 2023-06-21 DIAGNOSIS — R001 Bradycardia, unspecified: Secondary | ICD-10-CM | POA: Diagnosis not present

## 2023-06-21 MED ORDER — ATENOLOL 25 MG TABLET
ORAL_TABLET | Freq: Two times a day (BID) | ORAL | 3 refills | 100 days | Status: CP
Start: 2023-06-21 — End: ?

## 2023-06-21 MED ORDER — TRAZODONE 100 MG TABLET
ORAL_TABLET | 3 refills | 0 days | Status: CP
Start: 2023-06-21 — End: ?

## 2023-06-21 MED ORDER — OMEPRAZOLE 40 MG CAPSULE,DELAYED RELEASE
ORAL_CAPSULE | Freq: Two times a day (BID) | ORAL | 3 refills | 90 days | Status: CP
Start: 2023-06-21 — End: ?

## 2023-06-21 NOTE — Unmapped (Signed)
Coronary CT scan ordered

## 2023-06-21 NOTE — Unmapped (Signed)
DIVISION OF CARDIOLOGY   [employer], [place]        Date of Service: 06/21/2023    Assessment/Plan     1.Atypical Chest pain  Patient presenting today with atypical chest pain without clear etiology, most likely related to her GERD based on presentation of symptoms. The chest pain is not exertional, and it occurs randomly throughout the day with a burning quality. Not improved with rest. She denies relation to eating or laying flat. She has visited the ED multiple times for this problem. CV risk factors (age, gender, hypertension, and SLE), currently on statin therapy (last ldl 56). Her last echocardiogram in 2022 showed EF 60-65% without evidence of LVH, unlikely to have drastically changed with her current symptoms. EKG today is unremarkable. Will pursue CTA coronary scan to rule out coronary artery disease as a contributing factor to her atypical chest pain.   The 10-year ASCVD risk score (Arnett DK, et al., 2019) is: 13.3%    - Continue atorvastatin 40 mg   - Obtain CTA Coronary scan  - Refilled omeprazole.   - Follow-up in 1 month    2. Hypertension.   Well controlled today.   - Continue amlodipine, hydralazine, telmisartan      3. Palpitations  4.  PVCs  Ziopatch showed no significant arrhythmias. Symptoms correlated with SR or VE (<1% burden)  Well controlled with no episodes of palpitations since last medication change.  - Continue atenolol 37.5 mg bid.    Return to clinic:    Return in about 4 weeks (around 07/19/2023).    Hanley Seamen PA-S2  Physician Assistant Student     I saw the patient as part of a medically necessary shared visit with the APP. I personally performed the substantive portion of this visit which included Time/MDM: 36 minutes spent face-to-face and non-face-to-face in the care of this patient, which includes all pre, intra, and post visit time on the date of service which was specific to E/M and does not include any procedures that may have been performed. Glade Stanford, AGNP-C  Cardiology Nurse Practitioner  [facility]    Subjective:   QVZ:DGLOVF, [name], MD  Chief complaint:  67 y.o. female with HTN, SLE, post inflammatory pulmonary fibrosis who presents for f/up after ED visit on 06/13/23 for chest pain    History of Present Illness:  First seen 05/13/22 - Had been having bad heart palpitations requiring ED eval x2 at OSH. Was told it was premature beat based on telemetry.   Had prior sleep study neg for sleep apnea, TSH normal  No caffeine or ETOh  Atenolol dose increased to 25 bid.     Ziopatch showed no significant arrhythmias. Symptoms correlated with SR or VE (<1% burden)  Echo in December 2022 showed structurally normal heart.    07/2022. Palpitations initially improved w/ increase atenolol but recurred. ATenolol dose increased to 37.5 mg bid which has been working for patient    Interval History  06/13/23 patient presented to [facility] ED for atypical chest pain described as achy discomfort for several days with an associated cough, work-up was unremarkable with negative d-dimer, troponin initially 5 with serial repeat being 4. EKG report showed no ischemic changes,some T-wave inversions at that time thought to be related to artifact, LVH, and repolarization.  The ED disposition states the final diagnosis was due to an exacerbation of her underlying ILD, but they referred her to cardiology due to the ECG  showing LVH and repolarization abnormalities.    She had an ED follow-up appointment at [facility] on 10/11 with Dr. Macon Large who wanted to pursue a CTA coronary scan, but patient wanted a second opinion which is what brought her in today.     Today -    Has chest discomfort that comes and goes, she believes is related to GERD. She describes it as a heartburn but no correlation w/ food intake. Has gone to the ER multiple times for the same issue. The chest discomfort is not related to exercise or eating. She finds it comes on randomly and goes away randomly. Recently ran out of her omeprazole a few days ago. At the ED she was told her heart was larger on one side, this worried her. She has baseline shortness of breath due to her ILD, she walks daily. She has found she is becoming more SOB in recent weeks, follows with pulmonology. Denies PND/orthopnea and peripheral edema. She has a baseline dry cough.     She notes having a balance/ gait issue lately that is making her unstable. Recently received a life alert in case she falls.     Palpitations well controlled on atenolol, no episodes since starting higher dose.     Cardiovascular History:  HTN  Previous exercise stress echocardiogram performed at [facility] in May 2021 showed no evidence of ischemia  Previous CT angiogram performed in May 2021 showed no evidence of coronary calcification, this was a PE protocol CT scan which also showed evidence of pulmonary embolism    Past Medical History  Patient Active Problem List   Diagnosis    Hypertension, benign    Benign neoplasm of pituitary gland and craniopharyngeal duct (pouch) (CMS-HCC)    Chronic kidney disease, stage I    Dysphonia    Mixed urge and stress incontinence    Myalgia and myositis    Osteoarthrosis    Proteinuria    Sensorineural hearing loss    VIN II (vulvar intraepithelial neoplasia II)    Family history of breast cancer    Pain medication agreement signed    Episcleritis    SLE (systemic lupus erythematosus) (CMS-HCC)    S/P total knee replacement    Asthma    Postinflammatory pulmonary fibrosis (CMS-HCC)    Other diseases of vocal cords    Regurgitation    Epigastric burning sensation    Aspiration pneumonitis (CMS-HCC)    Back pain    Cough    Multiple pulmonary nodules    Shortness of breath    Vocal cord dysfunction    Screening for cardiovascular condition    Obesity with body mass index 30 or greater    Generalized anxiety disorder    Osteoarthritis    Chronic constipation    Chronic recurrent major depressive disorder (CMS-HCC)    Dyslipidemia    Obesity, diabetes, and hypertension syndrome (CMS-HCC)    Intertrigo    Fibrositis    Gastroesophageal reflux disease without esophagitis    Vitamin D deficiency    Encounter for screening for cardiovascular disorders    Seborrhea capitis    Perennial allergic rhinitis with seasonal variation    Moderate dysplasia of vulva    Spinal stenosis of lumbar region    Keratosis pilaris    Fibromyalgia    Insomnia, persistent    Neck pain    Arthritis, degenerative    Asthma, chronic    Auditory impairment    Bronchiolitis obliterans organizing  pneumonia (CMS-HCC)    Chronic nausea    Eczema intertrigo    H/O aspiration pneumonitis    H/O total knee replacement    Lung involvement in systemic lupus erythematosus (CMS-HCC)    Lung nodule, multiple    Neurogenic claudication    Pulmonary nodules    Treadmill stress test negative for angina pectoris    Vertigo    Hyperglycemia    Otalgia    Immunocompromised (CMS-HCC)    PVC (premature ventricular contraction)       Medications:  Current Outpatient Medications   Medication Sig Dispense Refill    albuterol HFA 90 mcg/actuation inhaler Inhale 2 puffs every six (6) hours as needed for wheezing. 8 g 11    amlodipine (NORVASC) 2.5 MG tablet Take 3 tablets (7.5 mg total) by mouth daily. 90 tablet 3    aspirin (ECOTRIN) 81 MG tablet Take 1 tablet (81 mg total) by mouth daily.      atorvastatin (LIPITOR) 40 MG tablet Take 1 tablet (40 mg total) by mouth daily.      biotin 1 mg cap Take by mouth.      buPROPion (WELLBUTRIN XL) 300 MG 24 hr tablet Take 1 tablet (300 mg total) by mouth daily. 90 tablet 3    clonazePAM (KLONOPIN) 0.5 MG tablet Take 0.5 tablets (0.25 mg total) by mouth Three (3) times a day as needed for anxiety. 30 tablet 1    clotrimazole-betamethasone (LOTRISONE) 1-0.05 % cream Apply 1 Application topically two (2) times a day.      cyclobenzaprine (FLEXERIL) 5 MG tablet Take 1 tablet (5 mg total) by mouth two (2) times a day as needed for muscle spasms. 180 tablet 1    diclofenac sodium (VOLTAREN) 1 % gel Apply 2 g topically two (2) times a day as needed for arthritis. 300 g 5    dicyclomine (BENTYL) 20 mg tablet Take 1 tablet (20 mg total) by mouth daily as needed.      famotidine (PEPCID) 20 MG tablet TAKE 1 TABLET BY MOUTH TWICE  DAILY 200 tablet 2    fluticasone propionate (FLONASE) 50 mcg/actuation nasal spray       hydrALAZINE (APRESOLINE) 10 MG tablet       hydroxychloroquine (PLAQUENIL) 200 mg tablet Take 1 tablet (200 mg total) by mouth two (2) times a day. 180 tablet 3    hydrOXYzine (ATARAX) 10 MG tablet       inhalational spacing device (AEROCHAMBER MV) Spcr 1 each by Miscellaneous route two (2) times a day. With Fovent 1 each 0    lamoTRIgine (LAMICTAL) 200 MG tablet TAKE 1 TABLET BY MOUTH TWICE  DAILY 200 tablet 2    lidocaine (XYLOCAINE) 5 % ointment Apply topically two (2) times a day. 100 g 2    lidocaine (XYLOCAINE) 5 % ointment Apply topically four (4) times a day. 480 g 0    linaCLOtide (LINZESS) 290 mcg capsule Take 1 capsule (290 mcg total) by mouth daily. 30 capsule 5    loratadine (CLARITIN) 10 mg tablet Take 1 tablet (10 mg total) by mouth daily.      melatonin 10 mg Tab Take 1 tablet by mouth.      metFORMIN (GLUCOPHAGE-XR) 500 MG 24 hr tablet Take 1 tablet (500 mg total) by mouth daily before breakfast.      mupirocin (BACTROBAN) 2 % ointment APP EXT IEN BID  0    mycophenolate (MYFORTIC) 360 MG TbEC Take 2 tablets (720 mg total)  by mouth two (2) times a day. 360 tablet 3    olopatadine (PATANOL) 0.1 % ophthalmic solution Apply 1 drop to eye daily.      ondansetron (ZOFRAN) 4 MG tablet       polyethylene glycol (GLYCOLAX) 17 gram/dose powder Take 17 g by mouth daily.      predniSONE (DELTASONE) 1 MG tablet TAKE 2.5 TABLETS (2.5 MG TOTAL) BY MOUTH DAILY. TO START ONCE PREDNISONE TAPER HAS BEEN COMPLETED (SHOULD BE ON/AROUND 05/25/23) 60 tablet 0    pregabalin (LYRICA) 75 MG capsule Take 1 capsule (75 mg total) by mouth two (2) times a day. 180 capsule 0    telmisartan (MICARDIS) 80 MG tablet Take 1 tablet (80 mg total) by mouth daily. 90 tablet 3    traZODone (DESYREL) 100 MG tablet Take 1.5 tablets (150 mg total) by mouth nightly. 135 tablet 3    atenolol (TENORMIN) 25 MG tablet Take 1.5 tablets (37.5 mg total) by mouth two (2) times a day. 300 tablet 3    omeprazole (PRILOSEC) 40 MG capsule Take 1 capsule (40 mg total) by mouth two (2) times a day. 180 capsule 3     No current facility-administered medications for this visit.       Allergies  Allergies   Allergen Reactions    Vilazodone Swelling    Ace Inhibitors Other (See Comments)     Pt states she can not take ace inhibitors. Pt states she can not remember her reaction.     Bee Pollen Itching     COUGH, WATERY EYES    Penicillin G Rash    Penicillins Rash    Pollen Extracts Itching     COUGHING, WATERY EYES       Social History:   Social History     Tobacco Use    Smoking status: Never    Smokeless tobacco: Never   Vaping Use    Vaping status: Never Used   Substance Use Topics    Alcohol use: Never    Drug use: Never     Comment: No history of IVDU, cocaine, or methamphetamines. No history of anorexigens.       Family History:  Family History   Problem Relation Age of Onset    Breast cancer Mother 33    Cancer Mother     Hypertension Mother     Diabetes Mother     Stomach cancer Father 69        unclear where primary was, died age 11    Cancer Father     Diabetes Sister     No Known Problems Daughter     No Known Problems Maternal Grandmother     Glaucoma Maternal Grandfather     Stroke Maternal Grandfather     No Known Problems Paternal Grandmother     Stroke Paternal Grandfather     Breast cancer Maternal Aunt 35    Cancer Maternal Aunt     Breast cancer Maternal Aunt 9    Cancer Maternal Aunt 35        unk. primary    Breast cancer Maternal Aunt         died around age 85, unclear age of diagnosis    Stomach cancer Maternal Uncle         unsure primary, died age 30    Stomach cancer Paternal Aunt         unclear where primary was    Cancer Paternal Uncle  back    Breast cancer Cousin 44        Died age 72. Earl's daughter.     Breast cancer Cousin 36        Treated with mastectomy. Now 51. Jo's daughter.     No Known Problems Other     ADD / ADHD Neg Hx     Alcohol abuse Neg Hx     Anxiety disorder Neg Hx     Bipolar disorder Neg Hx     Dementia Neg Hx     Depression Neg Hx     Drug abuse Neg Hx     OCD Neg Hx     Paranoid behavior Neg Hx     Physical abuse Neg Hx     Schizophrenia Neg Hx     Seizures Neg Hx     Sexual abuse Neg Hx     Colon cancer Neg Hx     Endometrial cancer Neg Hx     Ovarian cancer Neg Hx     BRCA 1/2 Neg Hx     Melanoma Neg Hx     Basal cell carcinoma Neg Hx     Squamous cell carcinoma Neg Hx        ROS- 12 system review is negative other than what is specified in the History of Present Illness.      Objective:   Physical Exam  Vitals:    06/21/23 0841   BP: 124/63   BP Site: L Arm   BP Position: Sitting   BP Cuff Size: Large   Pulse: 64   Resp: 20   SpO2: 96%   Weight: 88.7 kg (195 lb 9.6 oz)   Height: 170.2 cm (5' 7)       Wt Readings from Last 3 Encounters:   06/21/23 88.7 kg (195 lb 9.6 oz)   06/08/23 86.2 kg (190 lb)   05/24/23 90.2 kg (198 lb 12.8 oz)     General-  Patient is well-appearing in no acute distress  Neurologic- Alert and oriented X3.  Cranial nerve II-XII grossly intact.  Cardiovascular-RRR, normal S1/S2, no murmurs/rubs/gallops  Pulmonology-Diminished breath sounds without wheezing, rales, or rhonchi  Extremities: No peripheral edema.   Psych- Normal mood, appropriate.    Laboratory data:      Component Value Date/Time    PROBNP 12.6 02/01/2017 1039    PROBNP 16 01/23/2014 1241       Lab Results   Component Value Date    WBC 3.9 05/18/2023    HGB 13.4 05/18/2023    HCT 40.9 05/18/2023    PLT 313 05/18/2023       Lab Results   Component Value Date    NA 143 06/06/2023    K 4.6 06/06/2023    CL 105 06/06/2023 CO2 31.0 06/06/2023    BUN 7 (L) 06/06/2023    CREATININE 0.90 06/06/2023    GLU 81 06/06/2023    CALCIUM 9.5 06/06/2023    MG 2.1 12/31/2013    PHOS 3.5 06/06/2023       Lab Results   Component Value Date    TSH 1.350 04/08/2019    ALBUMIN 3.6 06/06/2023    ALT 11 05/18/2023    AST 15 05/18/2023    INR 1.11 01/03/2014       No results found for: LDL, HDL    Electrocardiogram:  From 05/13/22 shows normal sinus rhythm minimal voltage criteria for LVH  From 06/20/23 shows sinus bradycardia  Cardiac Monitor:  Patient had a min HR of 52 bpm, max HR of 123 bpm, and avg HR of 67 bpm. Predominant underlying rhythm was Sinus Rhythm. Isolated SVEs were rare (<1.0%), and no SVE Couplets or SVE Triplets were present. Isolated VEs were rare (<1.0%), and no VE Couplets or VE Triplets were present.   Symptoms were associated with sinus rhythm and also isolated VEs.     Echocardiogram 08/27/21:   1. The left ventricular systolic function is normal, LVEF is visually  estimated at 60-65%.    2. The right ventricle is normal in size, with normal systolic function.    3. The pulmonary systolic pressure cannot be estimated due to insufficient  TR signal.

## 2023-06-22 ENCOUNTER — Ambulatory Visit (INDEPENDENT_AMBULATORY_CARE_PROVIDER_SITE_OTHER): Payer: 59 | Admitting: Nurse Practitioner

## 2023-06-22 ENCOUNTER — Inpatient Hospital Stay: Payer: 59 | Admitting: Nurse Practitioner

## 2023-06-22 ENCOUNTER — Encounter: Payer: Self-pay | Admitting: Nurse Practitioner

## 2023-06-22 ENCOUNTER — Other Ambulatory Visit: Payer: Self-pay | Admitting: Family Medicine

## 2023-06-22 ENCOUNTER — Ambulatory Visit: Admission: RE | Admit: 2023-06-22 | Payer: 59 | Source: Ambulatory Visit

## 2023-06-22 ENCOUNTER — Other Ambulatory Visit: Payer: Self-pay

## 2023-06-22 VITALS — BP 132/84 | HR 79 | Temp 97.8°F | Resp 16 | Ht 68.0 in | Wt 195.6 lb

## 2023-06-22 DIAGNOSIS — R0789 Other chest pain: Secondary | ICD-10-CM | POA: Diagnosis not present

## 2023-06-22 DIAGNOSIS — R079 Chest pain, unspecified: Secondary | ICD-10-CM | POA: Diagnosis not present

## 2023-06-22 NOTE — Progress Notes (Signed)
BP 132/84   Pulse 79   Temp 97.8 F (36.6 C) (Oral)   Resp 16   Ht 5\' 8"  (1.727 m)   Wt 195 lb 9.6 oz (88.7 kg)   SpO2 98%   BMI 29.74 kg/m    Subjective:    Patient ID: Nicole Ballard, female    DOB: 1956/06/16, 67 y.o.   MRN: [id number]  HPI: Nicole Ballard is a 67 y.o. female  Chief Complaint  Patient presents with   Follow-up    ER   Chest Pain    Seen Cardiology at [facility] yesterday. CT scheduled   ER follow up/ chest pain:seen in er on 06/13/2023 at [facility] for chest pain. Er HPI: For 2 days now patient has been experiencing a mild discomfort under her breastbone. She also reports is a slight cough with it. No nausea or vomiting. No fevers or chills. No leg swelling. No history of blood clots. No abdominal pain. Patient reports that she has had a slight cough and achiness below her breastbone. EKG completed, labs and chest xray.  She describes a slight cough with an achy discomfort below the sternum.  ECG on repeat with less artifact does not demonstrate any concerning findings.  Her initial troponin normal.  Symptoms atypical of ACS.  Will obtain second troponin, if this is negative I would highly doubt ACS.  The patient does however have findings concerning for left ventricular hypertrophy, repolarization abnormality, and I think she may benefit from repeat cardiology evaluation and echocardiogram moving forward though it may not be directly related to today's complaint.  Discussed with the patient she would be amenable of close follow-up with cardiology and I have placed a urgent referral for this. Her symptoms seem to suggest a mild cough but no hypoxia no wheezing no obvious evidence of exacerbation of underlying fibrosis.  Will check COVID test as well.  She is low risk without history of DVT PE, or signs or symptoms highly suggestive.  Last travel history was traveling to [place] with family. D-dimer exclusionary for PE in the setting of low risk.  Patient had follow up with  cardiology on 06/21/2023. Notes from cardiology: Patient presenting today with atypical chest pain without clear etiology, most likely related to her GERD based on presentation of symptoms. The chest pain is not exertional, and it occurs randomly throughout the day with a burning quality. Not improved with rest. She denies relation to eating or laying flat. She has visited the ED multiple times for this problem. CV risk factors (age, gender, hypertension, and SLE), currently on statin therapy (last ldl 56). Her last echocardiogram in 2022 showed EF 60-65% without evidence of LVH, unlikely to have drastically changed with her current symptoms. EKG today is unremarkable. Will pursue CTA coronary scan to rule out coronary artery disease as a contributing factor to her atypical chest pain.  The 10-year ASCVD risk score (Arnett DK, et al., 2019) is: 13.3%  - Continue atorvastatin 40 mg  - Obtain CTA Coronary scan - Refilled omeprazole.  - Follow-up in 1 month   Medication changes: none   Patient reports today that her chest pain still comes and goes. But overall doing better.  She is scheduled for her ct scan on 07/05/2023. She then has a follow up in 4 weeks to discuss results.     Relevant past medical, surgical, family and social history reviewed and updated as indicated. Interim medical history since our last visit reviewed. Allergies and medications  reviewed and updated.  Review of Systems  Constitutional: Negative for fever or weight change.  Respiratory: Negative for cough and shortness of breath.   Cardiovascular: positive for chest pain , negative for  palpitations.  Gastrointestinal: Negative for abdominal pain, no bowel changes.  Musculoskeletal: Negative for gait problem or joint swelling.  Skin: Negative for rash.  Neurological: Negative for dizziness or headache.  No other specific complaints in a complete review of systems (except as listed in HPI above).      Objective:    BP  132/84   Pulse 79   Temp 97.8 F (36.6 C) (Oral)   Resp 16   Ht 5\' 8"  (1.727 m)   Wt 195 lb 9.6 oz (88.7 kg)   SpO2 98%   BMI 29.74 kg/m   Wt Readings from Last 3 Encounters:  06/22/23 195 lb 9.6 oz (88.7 kg)  06/16/23 193 lb 8 oz (87.8 kg)  06/13/23 195 lb (88.5 kg)    Physical Exam  Constitutional: Patient appears well-developed and well-nourished.  No distress.  HEENT: head atraumatic, normocephalic, pupils equal and reactive to light, neck supple Cardiovascular: Normal rate, regular rhythm and normal heart sounds.  No murmur heard. No BLE edema. Pulmonary/Chest: Effort normal and breath sounds normal. No respiratory distress. Abdominal: Soft.  There is no tenderness. Psychiatric: Patient has a normal mood and affect. behavior is normal. Judgment and thought content normal.  Results for orders placed or performed in visit on 06/16/23  Basic Metabolic Panel (BMET)  Result Value Ref Range   Glucose 77 70 - 99 mg/dL   BUN 7 (L) 8 - 27 mg/dL   Creatinine, Ser 4.78 0.57 - 1.00 mg/dL   eGFR 65 >29 FA/OZH/0.86   BUN/Creatinine Ratio 7 (L) 12 - 28   Sodium 143 134 - 144 mmol/L   Potassium 4.8 3.5 - 5.2 mmol/L   Chloride 103 96 - 106 mmol/L   CO2 25 20 - 29 mmol/L   Calcium 9.7 8.7 - 10.3 mg/dL      Assessment & Plan:   Problem List Items Addressed This Visit   None Visit Diagnoses     Atypical chest pain    -  Primary        Follow up plan: No follow-ups on file.

## 2023-06-25 NOTE — Unmapped (Signed)
Pulmonary Clinic - Follow-up Visit      HISTORY:     Active Pulmonary Problems & Brief History:  Caitlin Smith is a 67 y.o. woman with many medical issues here for follow up of follow up for organizing pneumonia.     Interval History:  - 2016 and prior - patient followed at [facility]. They thought her symptoms of dyspnea were from obesity and asthma and recommended ENT evaluation. They also stated concern for chronic aspiration.   - 04/2016 - patient in [facility] system. She was thought to have multifactorial dyspnea, waxing and waning pulmonary opacities. Evaluated by pulmonary hypertension team without evidence of pulmonary hypertension. Underwent bronchoscopy with BAL and transbronchial biopsies which showed significant inflammation. DDX remained broad - CEP, COP, SLE pneumonitis, ABPA, EGPA, AEP, but it was decided to treat her with prolonged steroid taper.   - 08/23/16 - last visit with Dr. Johnnette Barrios - was improving with steroid taper   - 10/20/16 - established with Dr. Nilsa Nutting - there had been question of compliance with her prednisone since last visit, but by time of visit she had been on it consistently for one month. No clear improvement in dyspnea, but remains inactive.   - 02/09/17 - follow up - spirometry showed improvement in FVC and DLCOafter 2 months of steroid, had tapered off by time of visit. When she stopped the prednisone she developed increased cough, chest tightness, dyspnea.   - 03/27/17 - follow up - given steroid dependence was placed on MMF.   - 10/16/17 - follow up - dose reduced MMF for GI side effects. Prolonged steroid taper over this time.   - 02/06/2019 - last visit with pulmonary, Dr. Judeth Horn - at that time had gone to local ED for chest pain, CT chest showed interstitial lung disease in the lung bases, favored to reflect mild progression of chronic fibrotic nonspecific interstitial pneumonia (NSIP). It was unclear to Dr. Judeth Horn if that represented actual progression of her underlying disease.   - 03/03/21 - initial visit with me - DOE w/o chest pain. No inhalers. Chronic dry cough thought to be from reflux.  - 06/08/21 - ENT - larynx healthy on laryngoscopy. Referred to GI for GI discomfort and constipation.   - 07/04/21 - follow up - exercise increased to 50 minutes daily, acute worsening 2 weeks prior, evaluation with CT chest, echocardiogram, unremarkable. Chest fluoroscopy with decreased, but normal, movement of left hemidiaphragm.   - 09/13/20 - follow up - had URI over Christmas and New Year treat with doxycycline and followed with lingering hoarseness, productive cough. Increased MMF.   - 12/29/21 - follow up - very easily dyspneic, even just walking to the car or climbing 12 stairs into house. Less active overall. Not as much coughing, allergies are improved. No heartburn with acid reflux medication.   - 02/16/22 - phone visit - PFTs improved from April, continued pred 10mg  daily, MMF 720 BID, HCQ 200mg  BID.   - 04/20/22 - follow up - some palpitations, some dyspnea, some dry cough associated with nausea and dizziness. No clubbing or LE edema. MMF 720mg  BID, HCQ 200mg  BID, prednisone 7.5mg  daily.   - 06/09/22 - ENT - PPI ineffective for cough.Trial of right SLN injection for neurogenic cough.  - 09/12/21 - follow up - doing pulmonary rehab, exercising 5 days a week, walking an hour a day, significant cough worst at night. No changes to treatment.   - 10/19/22 - follow up - nerve injection significantly helped  cough. Exercising without difficulty. DOE climbing stairs.   - 01/25/23 - follow up - significant anxiety about unintentional 40 pound weight loss. Some dyspnea with faster walking, climbing stairs. Still ongoing cough that returned a month after the injection. Is able to walk an hour every morning, 5 days per week. Taking 5mg  daily of prednisone (at night), mycophenolate 720mg  BID, HCQ 200mg  BID. Recommended to taper prednisone very slowly over the next few months, continue MMF/HCQ  - 06/26/23 - follow-up. Was doing well walking 5x/wk, then had to cut back to 3x/wk. She gets dyspneic with walking too long. She finds it hard to talk for long periods of time. She is on prednisone 1mg  currently. She is not sure that tapering the prednisone made her more dyspneic. Having to pace herself. She has been on prednisone for many years - on varying doses. Cough stable, no fevers. Hoarseness has come back some  - did an injection with ENT. Dry cough. Normally does not use albuterol but now using it more often though it does not seem to work. She has an old flovent rx that she has been using which used to help, but no longer. She does check her O2 number at home- gets down to 90 with walking She has done pulmonary rehab in the past at [facility]. She is overwhelmed by the # of appts. Would like virtual visit next time. Tearful today.     Past Medical History: The medical and surgical history were personally reviewed and updated in the patient's electronic medical record. Pertinent positives are documented above.  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 Pertinent positives are documented above.  Home Medications: Medications were reviewed and updated in the patient's electronic medical record. Pertinent positives are documented above.   Allergies: Allergies were reviewed and updated in the patient's electronic medical record. Pertinent positives are documented above.  Review of Systems: A comprehensive review of systems was completed and negative except as noted in HPI.    PHYSICAL EXAM:     Vitals:    06/26/23 0811   BP: 153/84   Pulse: 63   SpO2: 96%     General: pleasant individual appearing stated age, alert and oriented, no acute distress  HEENT: trachea midline, supple  CV:  RRR, systolic murmur, split S2?  Lungs: CTAB, good air movement throughout with fine bibasilar crackles bilaterally  Abd: not examined  Ext: Warm, well perfused, no peripheral edema, few pitting changes on nails   Skin: No rashes, skin breakdown, or wounds  Neuro: nonfocal, resting tremor in bilateral hands     LABORATORY and RADIOLOGY DATA:     Pulmonary Function Tests/Interpretation:  02/14/23      08/03/22      Pertinent Laboratory Data:  02/08/22 - Hg 13.4, absolute lymphocyte 3.8    04/25/16 BAL and TBBX   Cultures negative except for OPF     Cell count  PMN 31%  Lymph 11%  Macs 43%  Eos 7%  Baso 6%     Pathology   A: Lung, right lower lobe, endobronchial biopsy   - DIP-like reaction pattern with admixed eosinophils, consistent with eosinophilic pneumonia (see Micro Exam)  - Patchy organizing pneumonia  - Interstitial lymphoplasmacytic infiltrate, polytypic  - Partially denuded benign respiratory mucosa, without viral cytopathic effect  - No malignant neoplasm identified   - GMS and  AFB stains negative for fungi or AFB    Cytology  Lung, Bronchial lavage:  - No malignant cells identified.   - Alveolar macrophages and mixed inflammation.     Pertinent Imaging Data:  09/15/2022 Barium Swallow Double Contrast   Esophageal dysmotility.   Gastroesophageal reflux.   Elevation of the left hemidiaphragm.       06/29/22 CXR  Hypoinflated lungs with no acute abnormalities.     08/02/21 HRCT Chest  Slight worsening of fibrotic changes related to  interstitial lung disease associated with connective tissue disease.       Resolution of right lower lobe consolidation with central clearance consistent with organizing pneumonia.     08/26/21 Sniff Test  Elevated left hemidiaphragm with slightly diminished excursion relative to the right but no diaphragmatic paralysis.    Pertinent Cardiac Data:  05/26/22 Zio   Patient had a min HR of 52 bpm, max HR of 123 bpm, and avg HR of 67 bpm. Predominant underlying rhythm was Sinus Rhythm. Isolated SVEs were rare (<1.0%), and no SVE Couplets or SVE Triplets were present. Isolated VEs were rare (<1.0%), and no VE Couplets or VE Triplets were present.   Symptoms were associated with sinus rhythm and also isolated VEs.     08/27/21 TTE     1. The left ventricular systolic function is normal, LVEF is visually estimated at 60-65%.    2. The right ventricle is normal in size, with normal systolic function.    3. The pulmonary systolic pressure cannot be estimated due to insufficient TR signal.      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 Caitlin Smith is an older woman with hx of organizing vs eosinophilic PNA and now CT-ILD/post-inflammatory fibrosis in the setting of known SLE (follows with Dr. Scarlette Calico) who has been managed over the last several years on prednisone, mycophenolate, and hydroxychloroquine. She has been weaning off of prednisone since 01/2023 in the setting of inverse psoriasis and to prevent other complications. Recent monitoring labs with rheumatology were within normal limits.     She unfortunately feels quite dyspneic today (ongoing over several weeks to months). PFTs 02/14/2023 were stable to improved from prior. Although we do not have dedicated chest imaging since 08/02/21, she had a recent CT abd 01/21/23 and the fibrotic changes in the lung bases overall appear stable from prior. In the setting of her dyspnea, we will obtain repeat pulmonary function testing including lung volumes and DLCO as well as a dedicated HRCT to assess for worsening of her restrictive lung disease from her CTD-ILD. If stable or improving, would stop prednisone, consider echocardiogram to workup other causes of dyspnea (r/o pHTN) and encourage pulmonary rehab (she is open to this but currently doing PT/feeling overwhelmed by # of appts).  Plan  - Continue mycophenolate 720mg  BID, HCQ 200mg  BID. Pred currently at 1mg , will leave as-is/probably not doing much  - We will have her repeat FVL, DLCO, plethysmography   - HRCT  - consider echocardiogram if above unrevealing, pulmonary rehab  - could consider flovent MDI based on historical symptomatic benefit   [ ]  ask about OSA sxs at follow-up     The patient was seen with Dr. Okey Regal and will return to clinic in 2 months for a virtual visit to review the workup she has had done thusfar.     Kandis Fantasia, MD  Pulmonary and Critical Care Fellow

## 2023-06-26 ENCOUNTER — Ambulatory Visit
Admit: 2023-06-26 | Discharge: 2023-06-27 | Payer: MEDICARE | Attending: Student in an Organized Health Care Education/Training Program | Primary: Student in an Organized Health Care Education/Training Program

## 2023-06-26 DIAGNOSIS — J8410 Pulmonary fibrosis, unspecified: Secondary | ICD-10-CM | POA: Diagnosis not present

## 2023-06-26 DIAGNOSIS — J8489 Other specified interstitial pulmonary diseases: Secondary | ICD-10-CM | POA: Diagnosis not present

## 2023-06-26 DIAGNOSIS — M329 Systemic lupus erythematosus, unspecified: Secondary | ICD-10-CM | POA: Diagnosis not present

## 2023-06-26 MED ORDER — PREDNISONE 1 MG TABLET
ORAL_TABLET | Freq: Every day | ORAL | 0 refills | 90 days | Status: CP
Start: 2023-06-26 — End: ?

## 2023-06-26 NOTE — Unmapped (Addendum)
It was a pleasure meeting you in clinic today! You were seen in clinic today by Dr. Desiree Hane & Dr. Okey Regal      Here's a summary of what we discussed during your visit:  We will repeat your lung function testing and a CT scan of your lungs to see if there are any changes  Please reach out to your doctor about injections to help with cough  If CT chest imaging was ordered today, please call [facility] at [phone] to schedule your CT Chest.  Keep up the good work with taking your medications and exercising      Follow up with me in 3 months!    Desiree Hane, MD  Pulmonary & Critical Care Fellow  [facility]  Phone: [phone]  Fax: [phone]    To contact your care team, you can either send a message via MyChart or contact the clinic at [phone].    How do I request medication refills?  Request a refill via [facility] (patient portal), call clinic at [phone] or have your pharmacy fax the request to [phone].    [facility]: [phone] *Pharmacy can mail medications to your home. You must call to request the medication be mailed.[facility]: [phone]  [facility]: [phone]    Here are some things you should know about contacting the [facility]:  Please be advised Epic now releases test results to MyChart as soon as they are available which means you will see your test results before I do.    The best way to reach your doctor for non-urgent matters is through MyChart. I can usually respond within two to three business day but I do not check messages after hours (evenings, weekends, and holidays) and often have other duties (inpatient hospital work, Producer, television/film/video activities, teaching) that make me unavailable.    - If you have sent a MyChart message to the clinic and have not received a response after three business days, please call our clinic at [phone] to speak to a nurse.     If you have an urgent issue that you feel needs a response the same day, you should also contact your primary care provider or be evaluated at an Urgent Care clinic.    For urgent lung issues after hours, you can call the hospital operator [phone]) and ask for the Pulmonary Fellow On Call. This doctor can provide some guidance and will send a message to your regular lung doctor the next morning.    If there is an emergency, call 911 or go to your closest emergency room.    I don't have a MyChart. Why should I get one?   - It's encrypted, so your information is secure  - It's a quick, easy way to contact the care team, manage appointments, see test results, and more!    How do I sign-up for MyChart?   - Download the MyChart app from the [employer] or [employer] and sign-up in the app  - Sign-up online at [email or web address]

## 2023-06-27 ENCOUNTER — Other Ambulatory Visit: Payer: Self-pay | Admitting: Family Medicine

## 2023-06-27 ENCOUNTER — Encounter: Payer: Self-pay | Admitting: Family Medicine

## 2023-06-27 DIAGNOSIS — J302 Other seasonal allergic rhinitis: Secondary | ICD-10-CM

## 2023-06-27 NOTE — Telephone Encounter (Signed)
Spoke with patient and she will come in for BP check per Dr. Carlynn Purl before this medication is reactivated.

## 2023-06-28 ENCOUNTER — Ambulatory Visit: Payer: 59

## 2023-06-29 NOTE — Progress Notes (Signed)
done

## 2023-06-30 DIAGNOSIS — G629 Polyneuropathy, unspecified: Secondary | ICD-10-CM | POA: Diagnosis not present

## 2023-06-30 DIAGNOSIS — M19019 Primary osteoarthritis, unspecified shoulder: Secondary | ICD-10-CM | POA: Diagnosis not present

## 2023-06-30 DIAGNOSIS — M170 Bilateral primary osteoarthritis of knee: Secondary | ICD-10-CM | POA: Diagnosis not present

## 2023-07-03 ENCOUNTER — Ambulatory Visit: Payer: 59 | Admitting: Family Medicine

## 2023-07-03 NOTE — Progress Notes (Unsigned)
Name: Nicole Ballard   MRN: [id number]    DOB: August 15, 1956   Date:07/04/2023       Progress Note  Subjective  Chief Complaint  Follow Up  HPI  Unintentional weight loss/malnutrition  she states her diet has not changed or her level of physical activity - she has been walking 1 hour per day for over one year now - she walks as recommended by her pulmonologist to improve lung function.  She is up to date with health maintenance colonoscopy and mammogram , she had a hysterectomy in the past recently seen by gyn and had a normal pelvic exam   She states appetite is fine, no change in bowel movements but she has noticed some nausea lately. She has lost weight in the past due to stress but her depression seems to be under better control now and she states not really stressed about anything at this time. She has lost over 5 % of her weigh tin the past 3 months and over 10 % in the past year She has lupus and other auto immune disorders . She is now seeing Dr. Cathie Hoops for gammopathy - but no M spike, due to family history of gastric and breast cancer , she had a genetic testing done and per patient negative    09/2021 - 239 lbs 12/2021 -  241 lbs 04/2022 - 246 lbs  06/2022 - 245 lbs 07/2022 - 233 lbs  10/2022 - 219 lbs  01/2023 -205.9 lb 02/2023 - 204 lbs 06/2023 - 190 lbs   Vertigo: she still has intermittent symptoms, no longer taking meclizine due to age and possible side effects. She needs a refill of Zofran . She is getting PT for her balance   She is under the care of Dr. Berdie Ogren at [facility], she also sees neurologist   HTN:   Continue Atenolol 25 BID ,  Norvasc 2.5 mg and Telmisartan 80 mg , she states bp has been well controlled at home lately, 120's/70's She has chronic sob and multifactorial, she has intermittent palpitation but stable.    INTERPRETATION ( done 02/2019 )  Normal Stress Echocardiogram NORMAL RIGHT VENTRICULAR SYSTOLIC FUNCTION TRIVIAL REGURGITATION NOTED  NO VALVULAR STENOSIS  NOTED   Aorta Atherosclerosis: she is on statin therapy. Reviewed CT report from 08/2022 with patient . She is taking atorvastatin and we will recheck labs today    History of DM: denies polyphagia, polyuria or polydipsia A1C  dropped from 6.3 % to 5.9 % continue life style modification. .  She is on metformin due to gradual weight gain but now is losing a lot of weight, discussed stopping it but she does not want to stop at this time   Chronic Recurrent MDD /Mild Neurocognitive Impairment: under the care of psychiatrist  at [facility] psychiatry, she is taking Effexor 100  mg, Lamictal, Wellbutrin 300 mg now, and Klonopin qhs. She is doing better with her psychiatrist . She also has a therapist. She is concerned about her weight loss. Reminded her that it has happened before and she has seen all the doctors and given reassurance. Advised to keep a good intake of protein and try to maintain her weight now   Dyslipidemia: reviewed last labs, taking Atorvastatin and denies side effects. LDL 56 done Nov 2023 ,we will recheck it today    Post-inflammatory pulmonary fibrosis/Asthma? Lupus involvement of lung : under the care of pulmonologist at [facility], she has occasional cough and SOB - likely multifactorial. She is on  PPI and also uses Flovent . She is walking as recommended by pulmonologist    Lupus involvement lungs/Immunosuppression  she is seeing Rheumatologist , taking plaquenil and prednisone daily but now down to 1 mg daily,  she still has some SOB but improved since she lost weight and has been walking daily . She states symptoms have been stable.    Chronic nausea: taking zofran prn and controls symptoms, she has a gastroenterologist She needs refills today to take medication prn    Empty Sella: last MRI was 2021  no longer showed pituitary adenoma. Seen by neurosurgeon in the past but now released She states headache is intermittent and not as frequent or as intense at this time.    Chronic  constipation: she is taking Linzess daily again , sometimes still skips a few days without bowel movements    Chronic pain from/Neurogenic claudication Lupus and FMS: going to pain clinic, she states her back pain is intense and sometimes, she also has knee pain, muscle aches. She is taking Lyrica three  daily and seems to help with pain . Pain level today is 6/10     Patient Active Problem List   Diagnosis Date Noted   Genetic testing 03/14/2023   Abnormal SPEP 01/13/2023   Weight loss 01/13/2023   Family history of breast cancer 01/13/2023   Asthma, chronic, severe persistent, uncomplicated 10/12/2022   Other vascular myelopathies (HCC) 10/12/2022   Interstitial lung disease (HCC) 07/12/2022   Empty sella (HCC) 07/12/2022   History of craniotomy 07/12/2022   Immunocompromised (HCC) 04/29/2021   Hyperglycemia 10/23/2017   Vertigo 09/29/2016   Bronchiolitis obliterans organizing pneumonia (HCC) 09/29/2016   Neck pain, musculoskeletal 12/15/2015   Cough in adult 09/11/2015   Abnormal CAT scan 04/11/2015   Auditory impairment 04/11/2015   Insomnia, persistent 04/11/2015   Chronic kidney disease (CKD), stage I 04/11/2015   Chronic nonmalignant pain 04/11/2015   Chronic constipation 04/11/2015   Dyslipidemia 04/11/2015   Fibromyalgia 04/11/2015   Gastro-esophageal reflux disease without esophagitis 04/11/2015   Dysphonia 04/11/2015   Low back pain 04/11/2015   Eczema intertrigo 04/11/2015   Keratosis pilaris 04/11/2015   Chronic recurrent major depressive disorder (HCC) 04/11/2015   Dysmetabolic syndrome 04/11/2015   Neurogenic claudication 04/11/2015   Perennial allergic rhinitis with seasonal variation 04/11/2015   Abnormal presence of protein in urine 04/11/2015   Seborrhea capitis 04/11/2015   Moderate dysplasia of vulva 04/11/2015   Vitamin D deficiency 04/11/2015   Spinal stenosis of lumbar region 04/11/2015   Treadmill stress test negative for angina pectoris 03/05/2015    Shortness of breath 05/20/2014   Morbid obesity (HCC) 02/03/2014   Asthma, chronic 12/31/2013   H/O total knee replacement 12/31/2013   Episcleritis 09/26/2013   Vocal cord dysfunction 05/28/2013   Anxiety, generalized 03/19/2013   Back pain 12/18/2012   Pulmonary nodules 11/26/2012   Lung nodule, multiple 11/26/2012   Arthritis, degenerative 11/01/2012   H/O aspiration pneumonitis 10/22/2012   Postinflammatory pulmonary fibrosis (HCC) 10/22/2012   Mixed incontinence 05/04/2012   Lung involvement in systemic lupus erythematosus (HCC) 11/05/2010   Chronic nausea 07/01/2008   Benign hypertension 01/25/2005    Past Surgical History:  Procedure Laterality Date   BRAIN SURGERY     CHOLECYSTECTOMY     VAGINAL HYSTERECTOMY     vulvar surgery      Family History  Problem Relation Age of Onset   Diabetes Mother    Hypertension Mother    Breast cancer Mother  Stomach cancer Father        d.>50   Breast cancer Maternal Aunt        dx >50   Breast cancer Maternal Aunt        dx 28s   Breast cancer Maternal Aunt    Cancer Maternal Uncle        unk type   Cancer Maternal Uncle        unk type   Breast cancer Cousin        dx 85s   Breast cancer Cousin        dx 30s, does not want genetic testing    Social History   Tobacco Use   Smoking status: Never   Smokeless tobacco: Never  Substance Use Topics   Alcohol use: No    Alcohol/week: 0.0 standard drinks of alcohol     Current Outpatient Medications:    albuterol (VENTOLIN HFA) 108 (90 Base) MCG/ACT inhaler, SMARTSIG:2 Puff(s) By Mouth Every 6 Hours PRN, Disp: , Rfl:    amLODipine (NORVASC) 2.5 MG tablet, TAKE 3 TABLETS BY MOUTH DAILY, Disp: 90 tablet, Rfl: 0   aspirin EC 81 MG tablet, Take 1 tablet (81 mg total) by mouth daily., Disp: 30 tablet, Rfl: 0   atenolol (TENORMIN) 25 MG tablet, Take 1 tablet by mouth in the morning and at bedtime., Disp: , Rfl:    atorvastatin (LIPITOR) 40 MG tablet, TAKE 1 TABLET BY  MOUTH DAILY, Disp: 30 tablet, Rfl: 0   Biotin 1 MG CAPS, Take 1 capsule by mouth daily., Disp: 30 capsule, Rfl: 0   buPROPion (WELLBUTRIN XL) 300 MG 24 hr tablet, Take 300 mg by mouth in the morning., Disp: , Rfl:    Cholecalciferol (VITAMIN D) 2000 UNITS CAPS, Take 1 capsule by mouth daily., Disp: , Rfl:    clobetasol ointment (TEMOVATE) 0.05 %, Apply 1 application topically 2 (two) times daily., Disp: , Rfl:    clonazePAM (KLONOPIN) 0.5 MG tablet, Take 0.25 mg by mouth at bedtime., Disp: , Rfl:    clotrimazole-betamethasone (LOTRISONE) cream, Apply 1 application topically 2 (two) times daily., Disp: 45 g, Rfl: 0   cyclobenzaprine (FLEXERIL) 5 MG tablet, Take by mouth., Disp: , Rfl:    diclofenac Sodium (VOLTAREN) 1 % GEL, Apply topically daily as needed., Disp: , Rfl:    fluticasone (FLONASE) 50 MCG/ACT nasal spray, Place 2 sprays into both nostrils daily., Disp: 48 g, Rfl: 3   hydroxychloroquine (PLAQUENIL) 200 MG tablet, Take by mouth 2 (two) times daily., Disp: , Rfl:    hydrOXYzine (ATARAX) 10 MG tablet, Take 1 tablet (10 mg total) by mouth 3 (three) times daily as needed., Disp: 30 tablet, Rfl: 0   lamoTRIgine (LAMICTAL) 200 MG tablet, Take 400 mg by mouth at bedtime., Disp: , Rfl:    LINZESS 145 MCG CAPS capsule, Take 145 mcg by mouth daily., Disp: , Rfl:    loratadine (CLARITIN) 10 MG tablet, Take 1 tablet (10 mg total) by mouth daily., Disp: 90 tablet, Rfl: 1   Melatonin 10 MG TABS, Take 1 tablet by mouth at bedtime., Disp: , Rfl:    metFORMIN (GLUCOPHAGE-XR) 500 MG 24 hr tablet, TAKE 1 TABLET BY MOUTH DAILY  WITH BREAKFAST, Disp: 100 tablet, Rfl: 2   mycophenolate (MYFORTIC) 360 MG TBEC EC tablet, Take 720 mg by mouth 2 (two) times daily., Disp: , Rfl:    olopatadine (PATANOL) 0.1 % ophthalmic solution, Place 1 drop into both eyes 2 (two) times daily., Disp:  5 mL, Rfl: 1   omeprazole (PRILOSEC) 40 MG capsule, Take 1 capsule by mouth in the morning and at bedtime. Pulmonologist, Disp: ,  Rfl:    polyethylene glycol (MIRALAX) 17 g packet, Take 17 g by mouth 2 (two) times daily as needed for moderate constipation., Disp: 20 each, Rfl: 0   predniSONE (DELTASONE) 1 MG tablet, Take 1 mg by mouth daily with breakfast., Disp: , Rfl:    predniSONE (DELTASONE) 2.5 MG tablet, Take 2 tablets by mouth daily at 12 noon., Disp: , Rfl:    pregabalin (LYRICA) 75 MG capsule, Take 75 mg by mouth daily., Disp: , Rfl:    telmisartan (MICARDIS) 80 MG tablet, TAKE 1 TABLET BY MOUTH DAILY, Disp: 30 tablet, Rfl: 0   traZODone (DESYREL) 100 MG tablet, Take 150 mg by mouth at bedtime., Disp: , Rfl:    triamcinolone ointment (KENALOG) 0.1 %, , Disp: , Rfl:    famotidine (PEPCID) 20 MG tablet, Take 20 mg by mouth daily., Disp: , Rfl:   Allergies  Allergen Reactions   Ace Inhibitors     Other reaction(s): OTHER Pt states she can not take ace inhibitors. Pt states she can not remember her reaction.    Bee Pollen    Penicillins    Pollen Extract     I personally reviewed active problem list, medication list, allergies, family history, social history, health maintenance with the patient/caregiver today.   ROS  Constitutional: Negative for fever , positive for weight change.  Respiratory: positive  for chronic cough and also has shortness of breath.   Cardiovascular: Negative for chest pain or palpitations.  Gastrointestinal: Negative for abdominal pain, no bowel changes.  Musculoskeletal: positive  for gait problem but no  joint swelling.  Skin: Negative for rash.  Neurological: Negative for dizziness or headache.  No other specific complaints in a complete review of systems (except as listed in HPI above).   Objective  Vitals:   07/04/23 0840  BP: 120/70  Pulse: 72  Resp: 16  SpO2: 100%  Weight: 190 lb (86.2 kg)  Height: 5\' 7"  (1.702 m)    Body mass index is 29.76 kg/m.  Physical Exam  Constitutional: Patient appears well-developed and malnourished / temporal waisting No  distress.  HEENT: head atraumatic, normocephalic, pupils equal and reactive to light, neck supple Cardiovascular: Normal rate, regular rhythm and normal heart sounds.  No murmur heard. No BLE edema. Pulmonary/Chest: Effort normal and breath sounds normal. No respiratory distress. Abdominal: Soft.  There is no tenderness. Psychiatric: Patient has a normal mood and affect. behavior is normal. Judgment and thought content normal.    PHQ2/9:    07/04/2023    8:40 AM 06/22/2023    8:26 AM 04/10/2023    2:33 PM 03/21/2023   10:10 AM 03/01/2023    7:47 AM  Depression screen PHQ 2/9  Decreased Interest 1 1 1  0 1  Down, Depressed, Hopeless 1 1 0 1 1  PHQ - 2 Score 2 2 1 1 2   Altered sleeping 1 1 1 1 1   Tired, decreased energy 1 0 1 1 1   Change in appetite 1 0 2 1 1   Feeling bad or failure about yourself  0 0 0 0 0  Trouble concentrating 0 0 0 1 1  Moving slowly or fidgety/restless 0 0 0 0 0  Suicidal thoughts 0 0 0 0 0  PHQ-9 Score 5 3 5 5 6   Difficult doing work/chores  Not difficult at all  Somewhat difficult Somewhat difficult     phq 9 is positive   Fall Risk:    07/04/2023    8:39 AM 06/22/2023    8:26 AM 04/10/2023    2:32 PM 03/21/2023   10:10 AM 03/01/2023    7:47 AM  Fall Risk   Falls in the past year? 1 1 0 0 0  Number falls in past yr: 1 1  0 0  Injury with Fall? 0 0  0 0  Risk for fall due to : No Fall Risks No Fall Risks No Fall Risks No Fall Risks No Fall Risks  Follow up Falls prevention discussed Falls prevention discussed Falls prevention discussed Falls prevention discussed;Education provided;Falls evaluation completed Falls prevention discussed      Functional Status Survey: Is the patient deaf or have difficulty hearing?: Yes Does the patient have difficulty seeing, even when wearing glasses/contacts?: No Does the patient have difficulty concentrating, remembering, or making decisions?: Yes Does the patient have difficulty walking or climbing stairs?:  Yes Does the patient have difficulty dressing or bathing?: No Does the patient have difficulty doing errands alone such as visiting a doctor's office or shopping?: No    Assessment & Plan  1. Atherosclerosis of aorta (HCC)  - atorvastatin (LIPITOR) 40 MG tablet; Take 1 tablet (40 mg total) by mouth daily.  Dispense: 90 tablet; Refill: 1  2. Empty sella (HCC)  Chronic and stable  3. Postinflammatory pulmonary fibrosis (HCC)  Under the care of pulmonologist at [facility] and will have another PFT done soon   4. Immunocompromised (HCC)  On prednisone   5. Chronic recurrent major depressive disorder (HCC)  Mood seems to be better, taking medications prescribed by pulmonologist   6. Lung involvement in systemic lupus erythematosus (HCC)  On low dose prednisone  7. Neurogenic claudication  Stable, takes breaks when walking   8. Benign hypertension  - telmisartan (MICARDIS) 80 MG tablet; Take 1 tablet (80 mg total) by mouth daily.  Dispense: 90 tablet; Refill: 1 - amLODipine (NORVASC) 2.5 MG tablet; Take 3 tablets (7.5 mg total) by mouth daily.  Dispense: 2700 tablet; Refill: 1 - hydrALAZINE (APRESOLINE) 10 MG tablet; Take 1 tablet (10 mg total) by mouth 2 (two) times daily as needed.  Dispense: 60 tablet; Refill: 0  9. Fibromyalgia  Stable  10. Prediabetes   On metformin, discussed stopping it due to weight loss but she would like to stay on it for now

## 2023-07-04 ENCOUNTER — Encounter: Payer: Self-pay | Admitting: Family Medicine

## 2023-07-04 ENCOUNTER — Ambulatory Visit (INDEPENDENT_AMBULATORY_CARE_PROVIDER_SITE_OTHER): Payer: 59 | Admitting: Family Medicine

## 2023-07-04 VITALS — BP 120/70 | HR 72 | Resp 16 | Ht 67.0 in | Wt 190.0 lb

## 2023-07-04 DIAGNOSIS — E236 Other disorders of pituitary gland: Secondary | ICD-10-CM

## 2023-07-04 DIAGNOSIS — F339 Major depressive disorder, recurrent, unspecified: Secondary | ICD-10-CM

## 2023-07-04 DIAGNOSIS — R7303 Prediabetes: Secondary | ICD-10-CM

## 2023-07-04 DIAGNOSIS — R42 Dizziness and giddiness: Secondary | ICD-10-CM | POA: Diagnosis not present

## 2023-07-04 DIAGNOSIS — I10 Essential (primary) hypertension: Secondary | ICD-10-CM | POA: Diagnosis not present

## 2023-07-04 DIAGNOSIS — D849 Immunodeficiency, unspecified: Secondary | ICD-10-CM

## 2023-07-04 DIAGNOSIS — I700 Atherosclerosis of aorta: Secondary | ICD-10-CM | POA: Diagnosis not present

## 2023-07-04 DIAGNOSIS — M797 Fibromyalgia: Secondary | ICD-10-CM | POA: Diagnosis not present

## 2023-07-04 DIAGNOSIS — J8410 Pulmonary fibrosis, unspecified: Secondary | ICD-10-CM

## 2023-07-04 DIAGNOSIS — R29818 Other symptoms and signs involving the nervous system: Secondary | ICD-10-CM | POA: Diagnosis not present

## 2023-07-04 DIAGNOSIS — M3213 Lung involvement in systemic lupus erythematosus: Secondary | ICD-10-CM | POA: Diagnosis not present

## 2023-07-04 MED ORDER — AMLODIPINE BESYLATE 2.5 MG PO TABS
7.5000 mg | ORAL_TABLET | Freq: Every day | ORAL | 1 refills | Status: DC
Start: 2023-07-04 — End: 2023-09-08

## 2023-07-04 MED ORDER — HYDRALAZINE HCL 10 MG PO TABS
10.0000 mg | ORAL_TABLET | Freq: Two times a day (BID) | ORAL | 0 refills | Status: DC | PRN
Start: 2023-07-04 — End: 2023-07-19

## 2023-07-04 MED ORDER — ONDANSETRON HCL 4 MG PO TABS: 4.0000 mg | ORAL_TABLET | Freq: Three times a day (TID) | ORAL | 0 refills | Status: DC | PRN

## 2023-07-04 MED ORDER — ATORVASTATIN CALCIUM 40 MG PO TABS
40.0000 mg | ORAL_TABLET | Freq: Every day | ORAL | 1 refills | Status: DC
Start: 2023-07-04 — End: 2023-09-08

## 2023-07-04 MED ORDER — AMLODIPINE BESYLATE 2.5 MG PO TABS
7.5000 mg | ORAL_TABLET | Freq: Every day | ORAL | 1 refills | Status: DC
Start: 2023-07-04 — End: 2023-07-04

## 2023-07-04 MED ORDER — TELMISARTAN 80 MG PO TABS
80.0000 mg | ORAL_TABLET | Freq: Every day | ORAL | 1 refills | Status: DC
Start: 2023-07-04 — End: 2023-09-08

## 2023-07-04 NOTE — Patient Instructions (Signed)
Check to make sure you were not taking hydroxizine ( anxiety ) for bp instead of hydralazine

## 2023-07-05 ENCOUNTER — Ambulatory Visit: Admit: 2023-07-05 | Discharge: 2023-07-06 | Payer: MEDICARE

## 2023-07-05 DIAGNOSIS — M810 Age-related osteoporosis without current pathological fracture: Secondary | ICD-10-CM | POA: Diagnosis not present

## 2023-07-05 DIAGNOSIS — J8417 Interstitial lung disease with progressive fibrotic phenotype in diseases classified elsewhere: Secondary | ICD-10-CM | POA: Diagnosis not present

## 2023-07-05 DIAGNOSIS — M47817 Spondylosis without myelopathy or radiculopathy, lumbosacral region: Secondary | ICD-10-CM | POA: Diagnosis not present

## 2023-07-05 DIAGNOSIS — M47816 Spondylosis without myelopathy or radiculopathy, lumbar region: Secondary | ICD-10-CM | POA: Diagnosis not present

## 2023-07-05 DIAGNOSIS — R0602 Shortness of breath: Secondary | ICD-10-CM | POA: Diagnosis not present

## 2023-07-05 DIAGNOSIS — M51369 Other intervertebral disc degeneration, lumbar region without mention of lumbar back pain or lower extremity pain: Secondary | ICD-10-CM | POA: Diagnosis not present

## 2023-07-05 DIAGNOSIS — J8410 Pulmonary fibrosis, unspecified: Secondary | ICD-10-CM | POA: Diagnosis not present

## 2023-07-05 DIAGNOSIS — M359 Systemic involvement of connective tissue, unspecified: Secondary | ICD-10-CM | POA: Diagnosis not present

## 2023-07-05 DIAGNOSIS — R072 Precordial pain: Secondary | ICD-10-CM | POA: Diagnosis not present

## 2023-07-05 LAB — LIPID PANEL
Cholesterol: 124 mg/dL (ref <200)
HDL: 60 mg/dL (ref >=50)
LDL Cholesterol (Calc): 50 mg/dL
Non-HDL Cholesterol (Calc): 64 mg/dL (ref <130)
Total CHOL/HDL Ratio: 2.1 (calc) (ref <5.0)
Triglycerides: 60 mg/dL (ref <150)

## 2023-07-05 MED ADMIN — iohexol (OMNIPAQUE) 350 mg iodine/mL solution 100 mL: 100 mL | INTRAVENOUS | @ 16:00:00 | Stop: 2023-07-05

## 2023-07-05 MED ADMIN — nitroglycerin (NITROLINGUAL) 0.4 mg/dose spray 2 spray: 2 | SUBLINGUAL | @ 15:00:00 | Stop: 2023-07-05

## 2023-07-06 ENCOUNTER — Ambulatory Visit: Admit: 2023-07-06 | Discharge: 2023-07-07 | Payer: MEDICARE

## 2023-07-06 DIAGNOSIS — Z79899 Other long term (current) drug therapy: Secondary | ICD-10-CM | POA: Diagnosis not present

## 2023-07-06 DIAGNOSIS — H25013 Cortical age-related cataract, bilateral: Secondary | ICD-10-CM | POA: Diagnosis not present

## 2023-07-06 DIAGNOSIS — M329 Systemic lupus erythematosus, unspecified: Secondary | ICD-10-CM | POA: Diagnosis not present

## 2023-07-06 DIAGNOSIS — H43393 Other vitreous opacities, bilateral: Secondary | ICD-10-CM | POA: Diagnosis not present

## 2023-07-06 NOTE — Unmapped (Signed)
1. Plaquenil 200mg  bid since appx 2000 for lupus  - 94 kg  - patient denies changes in vision  - normal exam and testing.  2. Dry eyes  -artificial tears 4/4 not helping, refer for dry eye eval.  3 syneresis OU- stable.  4. cataract- not visually significant. Observe.       rtc 1 year OCT FAF, VF  Cc Dr. Aldean Ast    INTERPRETATION EXTENDED OPHTHALMOSCOPT MACULA (90Dlens)  Macula - right:  Normal, no maculopathy, +syneresis  Macula - left:  Normal, no maculopathy, + syneresis

## 2023-07-10 NOTE — Unmapped (Signed)
Addended by: Kandis Fantasia on: 07/10/2023 10:31 AM     Modules accepted: Orders

## 2023-07-13 ENCOUNTER — Telehealth: Payer: Self-pay

## 2023-07-13 DIAGNOSIS — J8489 Other specified interstitial pulmonary diseases: Principal | ICD-10-CM

## 2023-07-13 MED ORDER — PREDNISONE 1 MG TABLET
ORAL_TABLET | Freq: Every day | ORAL | 1 refills | 60 days | Status: CP
Start: 2023-07-13 — End: ?

## 2023-07-13 NOTE — Telephone Encounter (Signed)
Transition Care Management Unsuccessful Follow-up Telephone Call  Date of discharge and from where:  06/13/2023 [facility]  Attempts:  1st Attempt  Reason for unsuccessful TCM follow-up call:  Voice mail full  Avier Jech [facility]  [facility], [facility] Guide Direct Dial: [phone]  Website: [name]

## 2023-07-13 NOTE — Unmapped (Signed)
Requested Prescriptions     Pending Prescriptions Disp Refills    predniSONE (DELTASONE) 1 MG tablet [Pharmacy Med Name: PREDNISONE 1 MG TABLET] 60 tablet 0     Sig: TAKE 2.5 TABLETS (2.5 MG TOTAL) BY MOUTH DAILY. TO START ONCE PREDNISONE TAPER HAS BEEN COMPLETED (SHOULD BE ON/AROUND 05/25/23)     Patient was last seen 06/26/2023.  Follow up scheduled for 09/11/2023

## 2023-07-15 DIAGNOSIS — R1013 Epigastric pain: Principal | ICD-10-CM

## 2023-07-15 MED ORDER — ATENOLOL 25 MG TABLET
ORAL_TABLET | Freq: Two times a day (BID) | ORAL | 2 refills | 0 days
Start: 2023-07-15 — End: ?

## 2023-07-15 MED ORDER — FAMOTIDINE 20 MG TABLET
ORAL_TABLET | Freq: Two times a day (BID) | ORAL | 2 refills | 0 days
Start: 2023-07-15 — End: ?

## 2023-07-18 ENCOUNTER — Other Ambulatory Visit: Payer: Self-pay | Admitting: Family Medicine

## 2023-07-18 ENCOUNTER — Other Ambulatory Visit: Payer: Self-pay

## 2023-07-18 ENCOUNTER — Emergency Department: Payer: 59

## 2023-07-18 ENCOUNTER — Emergency Department
Admission: EM | Admit: 2023-07-18 | Discharge: 2023-07-18 | Disposition: A | Discharge status: Home / Self Care | Payer: 59 | Attending: Emergency Medicine | Admitting: Emergency Medicine

## 2023-07-18 DIAGNOSIS — I10 Essential (primary) hypertension: Secondary | ICD-10-CM

## 2023-07-18 DIAGNOSIS — Y92410 Unspecified street and highway as the place of occurrence of the external cause: Secondary | ICD-10-CM | POA: Insufficient documentation

## 2023-07-18 DIAGNOSIS — R0781 Pleurodynia: Secondary | ICD-10-CM | POA: Diagnosis not present

## 2023-07-18 DIAGNOSIS — J45909 Unspecified asthma, uncomplicated: Secondary | ICD-10-CM | POA: Diagnosis not present

## 2023-07-18 DIAGNOSIS — R6889 Other general symptoms and signs: Secondary | ICD-10-CM | POA: Diagnosis not present

## 2023-07-18 DIAGNOSIS — R079 Chest pain, unspecified: Secondary | ICD-10-CM | POA: Diagnosis not present

## 2023-07-18 MED ORDER — FAMOTIDINE 20 MG TABLET
ORAL_TABLET | Freq: Two times a day (BID) | ORAL | 2 refills | 100 days | Status: CP
Start: 2023-07-18 — End: ?

## 2023-07-18 MED ORDER — ATENOLOL 25 MG TABLET
ORAL_TABLET | Freq: Two times a day (BID) | ORAL | 2 refills | 100 days
Start: 2023-07-18 — End: ?

## 2023-07-18 MED ORDER — ACETAMINOPHEN 325 MG PO TABS
650.0000 mg | ORAL_TABLET | Freq: Once | ORAL | Status: AC
  Administered 2023-07-18: 650 mg via ORAL
  Filled 2023-07-18: qty 2

## 2023-07-18 MED ORDER — LIDOCAINE 5 % EX PTCH
1.0000 | MEDICATED_PATCH | Freq: Two times a day (BID) | CUTANEOUS | 0 refills | Status: AC
Start: 2023-07-18 — End: 2023-07-23

## 2023-07-18 MED ORDER — LIDOCAINE 5 % EX PTCH
1.0000 | MEDICATED_PATCH | CUTANEOUS | Status: DC
  Administered 2023-07-18: 1 via TRANSDERMAL
  Filled 2023-07-18: qty 1

## 2023-07-18 NOTE — Discharge Instructions (Addendum)
Your x-ray has not been read by the radiologist at this time, but you can find the results on MyChart.  You may also use the pain patches to help with your discomfort.  Please return for any new, worsening, or change in symptoms or other concerns including abdominal pain, bruising to your abdomen or chest, worsening pain, trouble breathing, vomiting, or any other concerns.  It was a pleasure caring for you today.
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
## 2023-07-18 NOTE — Unmapped (Signed)
Refills sent in October 2024

## 2023-07-19 DIAGNOSIS — J8489 Other specified interstitial pulmonary diseases: Principal | ICD-10-CM

## 2023-07-19 MED ORDER — ALBUTEROL SULFATE HFA 90 MCG/ACTUATION AEROSOL INHALER
Freq: Four times a day (QID) | RESPIRATORY_TRACT | 11 refills | 0 days | Status: CP | PRN
Start: 2023-07-19 — End: 2024-07-18

## 2023-07-19 NOTE — Unmapped (Signed)
Pharmacy Refill Request    Albuterol inh    Last OV: 06/26/23    Last written Rx: 08/31/22    Last dispensed:       Upcoming appointment:

## 2023-07-24 ENCOUNTER — Ambulatory Visit (INDEPENDENT_AMBULATORY_CARE_PROVIDER_SITE_OTHER): Payer: 59 | Admitting: Physician Assistant

## 2023-07-24 ENCOUNTER — Encounter: Payer: Self-pay | Admitting: Physician Assistant

## 2023-07-24 ENCOUNTER — Telehealth: Admit: 2023-07-24 | Discharge: 2023-07-25 | Payer: MEDICARE | Attending: Psychologist | Primary: Psychologist

## 2023-07-24 VITALS — BP 138/80 | HR 67 | Temp 97.5°F | Resp 16 | Ht 67.0 in | Wt 191.8 lb

## 2023-07-24 DIAGNOSIS — M546 Pain in thoracic spine: Secondary | ICD-10-CM

## 2023-07-24 DIAGNOSIS — F4310 Post-traumatic stress disorder, unspecified: Principal | ICD-10-CM

## 2023-07-24 DIAGNOSIS — F410 Panic disorder [episodic paroxysmal anxiety] without agoraphobia: Principal | ICD-10-CM

## 2023-07-24 DIAGNOSIS — F331 Major depressive disorder, recurrent, moderate: Principal | ICD-10-CM

## 2023-07-24 DIAGNOSIS — F411 Generalized anxiety disorder: Principal | ICD-10-CM
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
## 2023-07-24 NOTE — Patient Instructions (Signed)
Based on your symptoms and physical exam I believe the following is the cause of your concern today Back pain likely secondary to a strain of your back muscles  I recommend the following at this time to help relieve that discomfort:  Rest Warm compresses to the area (20 minutes on, minimum of 30 minutes off) You can alternate Tylenol and Ibuprofen for pain management but Ibuprofen is typically preferred to reduce inflammation.  Tylenol is preferred due to your kidney function but you can use Ibuprofen sparingly  (drink plenty of water with NSAID use) You can continue to use lidocaine patches and you can try Voltaren gel if you want to stay away from NSAIDs  Gentle stretches and exercises that I have included in your paperwork Try to reduce excess strain to the area and rest as much as possible    If these measures do not lead to improvement in your symptoms over the next 2-4 weeks please let us know
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
## 2023-07-25 ENCOUNTER — Ambulatory Visit: Admit: 2023-07-25 | Discharge: 2023-07-25 | Payer: MEDICARE

## 2023-07-25 DIAGNOSIS — J8410 Pulmonary fibrosis, unspecified: Secondary | ICD-10-CM | POA: Diagnosis not present

## 2023-07-25 DIAGNOSIS — I519 Heart disease, unspecified: Secondary | ICD-10-CM | POA: Diagnosis not present

## 2023-07-25 DIAGNOSIS — R0609 Other forms of dyspnea: Secondary | ICD-10-CM | POA: Diagnosis not present

## 2023-07-25 DIAGNOSIS — J8489 Other specified interstitial pulmonary diseases: Secondary | ICD-10-CM | POA: Diagnosis not present
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
## 2023-07-28 ENCOUNTER — Telehealth: Payer: Self-pay | Admitting: Family Medicine
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
## 2023-07-31 DIAGNOSIS — M170 Bilateral primary osteoarthritis of knee: Secondary | ICD-10-CM | POA: Diagnosis not present

## 2023-07-31 DIAGNOSIS — M19019 Primary osteoarthritis, unspecified shoulder: Secondary | ICD-10-CM | POA: Diagnosis not present

## 2023-07-31 DIAGNOSIS — G629 Polyneuropathy, unspecified: Secondary | ICD-10-CM | POA: Diagnosis not present

## 2023-07-31 DIAGNOSIS — Z803 Family history of malignant neoplasm of breast: Principal | ICD-10-CM

## 2023-07-31 DIAGNOSIS — Z1231 Encounter for screening mammogram for malignant neoplasm of breast: Principal | ICD-10-CM

## 2023-07-31 NOTE — Telephone Encounter (Signed)
Pts Son Nicole Ballard on the [place] - has concerns about the patients driving (more coordination and some memory concern and loss of weight. (Maybe has lost 25 lbs)  Nicole Ballard would like FMLA due mothers several health appointments and lack of transportation at this time.  Mark [phone]) works in the prison system and does not have his phone during the day.  Ok to follow up with sister Nicole Ballard [phone]) on DPR as she speak during the day  Pts son is also interested in resources such a senior transportation in [place] that the pt may benefit from.

## 2023-07-31 NOTE — Unmapped (Signed)
Called pt to let her know mammo orders have been put in and are pending.  Once insurance has finalized - schedulers will reach out to make appointment.  She thanked me for the follow up call and will wait to hear from scheduler.

## 2023-07-31 NOTE — Unmapped (Signed)
[facility] Other Call ONC Phone Room Smart Lists: General -     Hi,     Caro contacted the Communication Center regarding the following:    - Order needed for mammogram  Please call Crissy to schedule mammogram and follow up Clifton Custard    Please contact Melodie at [phone] .    Thanks in advance,    Yehuda Mao  [facility] Communication [facility]   [phone]    [facility]

## 2023-08-01 NOTE — Telephone Encounter (Signed)
Lvm on Mark vm informing him to get the FMLA paperwork from his job and that he will need to schedule an appointment and to bring Cougar with him. Also spoke with Contina, letting her also know the same information. She verbalized understanding.

## 2023-08-06 NOTE — Progress Notes (Unsigned)
   Acute Office Visit  Subjective:     Patient ID: Nicole Ballard, female    DOB: March 20, 1956, 67 y.o.   MRN: [id number]  No chief complaint on file.   HPI Patient is in today for discussion about FMLA paperwork.  ROS      Objective:    There were no vitals taken for this visit. {Vitals History (Optional):23777}  Physical Exam  No results found for any visits on 08/07/23.      Assessment & Plan:   Problem List Items Addressed This Visit   None   No orders of the defined types were placed in this encounter.   No follow-ups on file.  Margarita Mail, DO

## 2023-08-07 ENCOUNTER — Ambulatory Visit: Payer: 59 | Admitting: Internal Medicine

## 2023-08-07 ENCOUNTER — Telehealth: Payer: Self-pay

## 2023-08-07 NOTE — Telephone Encounter (Signed)
Transition Care Management Unsuccessful Follow-up Telephone Call  Date of discharge and from where:  [place] 11/12  Attempts:  1st Attempt  Reason for unsuccessful TCM follow-up call:  No answer/busy   Lenard Forth [place]  [facility], [facility] Guide, Phone: [phone] Website: [name]

## 2023-08-07 NOTE — Telephone Encounter (Signed)
Called pt son no answer left detailed vm, per Dr.Sowles he needs to be with pt for FMLA paperwork to be filled out

## 2023-08-07 NOTE — Telephone Encounter (Signed)
Copied from CRM [id number]. Topic: General - Other >> Aug 02, 2023  3:13 PM Turkey B wrote: Reason for CRM: pt's son called in , wanting to know why he has to have an appt to have FMLA paperwork filled out since Dr Carlynn Purl already knows pt's history

## 2023-08-08 ENCOUNTER — Telehealth: Payer: Self-pay

## 2023-08-08 NOTE — Telephone Encounter (Signed)
Transition Care Management Unsuccessful Follow-up Telephone Call  Date of discharge and from where:  [place] 11/12  Attempts:  2nd Attempt  Reason for unsuccessful TCM follow-up call:  No answer/busy   Nicole Forth [place]  [facility], [facility] Guide, Phone: [phone] Website: [name]

## 2023-08-09 ENCOUNTER — Ambulatory Visit (INDEPENDENT_AMBULATORY_CARE_PROVIDER_SITE_OTHER): Payer: 59 | Admitting: Nurse Practitioner

## 2023-08-09 ENCOUNTER — Encounter: Payer: Self-pay | Admitting: Nurse Practitioner

## 2023-08-09 ENCOUNTER — Ambulatory Visit: Admit: 2023-08-09 | Discharge: 2023-08-10 | Payer: MEDICARE

## 2023-08-09 VITALS — BP 120/78 | HR 80 | Wt 191.0 lb

## 2023-08-09 DIAGNOSIS — F339 Major depressive disorder, recurrent, unspecified: Secondary | ICD-10-CM

## 2023-08-09 DIAGNOSIS — J8410 Pulmonary fibrosis, unspecified: Secondary | ICD-10-CM | POA: Diagnosis not present

## 2023-08-09 DIAGNOSIS — Z0289 Encounter for other administrative examinations: Secondary | ICD-10-CM | POA: Diagnosis not present

## 2023-08-09 NOTE — Progress Notes (Signed)
BP 120/78   Pulse 80   Wt 191 lb (86.6 kg)   SpO2 97%   BMI 29.91 kg/m    Subjective:    Patient ID: Nicole Ballard, female    DOB: September 14, 1955, 67 y.o.   MRN: [id number]  HPI: Nicole Ballard is a 67 y.o. female  Chief Complaint  Patient presents with   Paperwork   Discussed the use of AI scribe software for clinical note transcription with the patient, who gave verbal consent to proceed.  History of Present Illness   The patient, with multiple chronic conditions, presents for completion of FMLA paperwork. She reports seeing multiple specialists, including pulmonology, rheumatology, pain management, psychiatry, ophthalmology, otolaryngology, and cardiology. She has approximately one to two appointments per week. Due to her chronic conditions, she is no longer able to drive and requires her son to take her to appointments. The son is seeking FMLA to assist with transportation and care.   depression screening positive- recommend patient follow up with psychiatrist.    Multiple chronic conditions include: Hypertension, pulmonary fibrosis, asthma, interstitial lung disease, GERD, myelopathies, empty sella, depression, dyslipidemia, chronic pain.     08/09/2023    2:49 PM 07/24/2023    1:06 PM 07/04/2023    8:40 AM 06/22/2023    8:26 AM 04/10/2023    2:33 PM  Depression screen PHQ 2/9  Decreased Interest 2 0 1 1 1   Down, Depressed, Hopeless 2 0 1 1 0  PHQ - 2 Score 4 0 2 2 1   Altered sleeping 1 0 1 1 1   Tired, decreased energy 2 0 1 0 1  Change in appetite 2 0 1 0 2  Feeling bad or failure about yourself  0 0 0 0 0  Trouble concentrating 1 0 0 0 0  Moving slowly or fidgety/restless 0 0 0 0 0  Suicidal thoughts 0 0 0 0 0  PHQ-9 Score 10 0 5 3 5   Difficult doing work/chores Somewhat difficult Not difficult at all  Not difficult at all Somewhat difficult    Relevant past medical, surgical, family and social history reviewed and updated as indicated. Interim medical  history since our last visit reviewed. Allergies and medications reviewed and updated.  Review of Systems  Constitutional: Negative for fever or weight change.  Respiratory: Negative for cough and shortness of breath.   Cardiovascular: Negative for chest pain or palpitations.  Gastrointestinal: Negative for abdominal pain, no bowel changes.  Musculoskeletal: Negative for gait problem or joint swelling.  Skin: Negative for rash.  Neurological: Negative for dizziness or headache.  No other specific complaints in a complete review of systems (except as listed in HPI above).      Objective:    BP 120/78   Pulse 80   Wt 191 lb (86.6 kg)   SpO2 97%   BMI 29.91 kg/m   Wt Readings from Last 3 Encounters:  08/09/23 191 lb (86.6 kg)  07/24/23 191 lb 12.8 oz (87 kg)  07/18/23 190 lb (86.2 kg)    Physical Exam  Constitutional: Patient appears well-developed and well-nourished.  No distress.  HEENT: head atraumatic, normocephalic, pupils equal and reactive to light, neck supple Cardiovascular: Normal rate, regular rhythm and normal heart sounds.  No murmur heard. No BLE edema. Pulmonary/Chest: Effort normal and breath sounds normal. No respiratory distress. Abdominal: Soft.  There is no tenderness. Psychiatric: Patient has a normal mood and affect. behavior is normal. Judgment and thought content normal.  Results for  orders placed or performed in visit on 07/04/23  Lipid panel  Result Value Ref Range   Cholesterol 124 <200 mg/dL   HDL 60 > OR = 50 mg/dL   Triglycerides 60 <478 mg/dL   LDL Cholesterol (Calc) 50 mg/dL (calc)   Total CHOL/HDL Ratio 2.1 <5.0 (calc)   Non-HDL Cholesterol (Calc) 64 <295 mg/dL (calc)      Assessment & Plan:   Problem List Items Addressed This Visit       Other   Chronic recurrent major depressive disorder (HCC)   Other Visit Diagnoses     Encounter for completion of form with patient    -  Primary       Assessment and Plan    Multiple  Chronic Conditions Patient has multiple specialists including pulmonology, rheumatology, pain management, psychiatry, ophthalmology, otolaryngology, and cardiology. She has approximately one to two appointments per week. Patient's son is applying for FMLA to assist with transportation to medical appointments due to patient's inability to drive. -Completed FMLA paperwork for patient's son to assist with transportation to medical appointments.        Depression screening positive- recommend patient follow up with psychiatry  Follow up plan: Return if symptoms worsen or fail to improve.

## 2023-08-10 DIAGNOSIS — J849 Interstitial pulmonary disease, unspecified: Secondary | ICD-10-CM | POA: Diagnosis not present

## 2023-08-10 DIAGNOSIS — M329 Systemic lupus erythematosus, unspecified: Secondary | ICD-10-CM | POA: Diagnosis not present

## 2023-08-10 DIAGNOSIS — G894 Chronic pain syndrome: Principal | ICD-10-CM

## 2023-08-10 DIAGNOSIS — M150 Primary generalized (osteo)arthritis: Principal | ICD-10-CM

## 2023-08-10 DIAGNOSIS — M48061 Spinal stenosis, lumbar region without neurogenic claudication: Principal | ICD-10-CM

## 2023-08-10 DIAGNOSIS — M797 Fibromyalgia: Principal | ICD-10-CM

## 2023-08-10 MED ORDER — LIDOCAINE 5 % TOPICAL OINTMENT
Freq: Four times a day (QID) | TOPICAL | 0 refills | 0 days
Start: 2023-08-10 — End: 2023-11-08

## 2023-08-10 MED ORDER — PREGABALIN 75 MG CAPSULE
ORAL_CAPSULE | Freq: Two times a day (BID) | ORAL | 0 refills | 90 days
Start: 2023-08-10 — End: 2023-11-08

## 2023-08-10 MED ORDER — CYCLOBENZAPRINE 5 MG TABLET
ORAL_TABLET | Freq: Two times a day (BID) | ORAL | 1 refills | 90 days | PRN
Start: 2023-08-10 — End: ?
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
## 2023-08-11 NOTE — Unmapped (Signed)
[facility] Triage Note    Reason for call: Return call    Time for incident: 1110 - 1116    Phone Assessment: Spoke with pt regarding call request.  She called to schedule appt and mammo - but insurance is still pending.  She was asking for updates.  Let her know that with pending status - we are waiting to hear back from insurance company for approval.  I cannot offer any other updates - but she could try contacting her insurance company for further details.  Pt agrees to this plan.     Triage Recommendations: Pt will follow up with insurance company.     Caller's Response: Thank you so much.     Outstanding tasks: None

## 2023-08-11 NOTE — Unmapped (Signed)
[facility] Other Call ONC Phone Room Smart Lists: General -     Hi,     Ameisha contacted the Communication Center regarding the following:    - Pt called back to ask to see what stage the process is on getting their mammogram approved by insurance.    Please contact Jamisen at [phone].    Thanks in advance,    Sharl Ma  [facility] Communication Center   [phone]    [facility]

## 2023-08-14 ENCOUNTER — Telehealth: Admit: 2023-08-14 | Discharge: 2023-08-15 | Payer: MEDICARE | Attending: Psychologist | Primary: Psychologist

## 2023-08-14 DIAGNOSIS — F410 Panic disorder [episodic paroxysmal anxiety] without agoraphobia: Principal | ICD-10-CM

## 2023-08-14 DIAGNOSIS — F411 Generalized anxiety disorder: Principal | ICD-10-CM

## 2023-08-14 DIAGNOSIS — F4310 Post-traumatic stress disorder, unspecified: Principal | ICD-10-CM

## 2023-08-14 DIAGNOSIS — F331 Major depressive disorder, recurrent, moderate: Principal | ICD-10-CM

## 2023-08-14 MED ORDER — CYCLOBENZAPRINE 5 MG TABLET
ORAL_TABLET | Freq: Two times a day (BID) | ORAL | 0 refills | 60.00 days | Status: CP | PRN
Start: 2023-08-14 — End: 2023-10-13

## 2023-08-14 MED ORDER — PREGABALIN 75 MG CAPSULE
ORAL_CAPSULE | Freq: Two times a day (BID) | ORAL | 0 refills | 60.00 days | Status: CP
Start: 2023-08-14 — End: 2023-10-13

## 2023-08-14 MED ORDER — LIDOCAINE 5 % TOPICAL OINTMENT
Freq: Four times a day (QID) | TOPICAL | 0 refills | 0.00 days | Status: CP
Start: 2023-08-14 — End: 2023-11-12

## 2023-08-14 MED ORDER — CLONAZEPAM 0.5 MG TABLET
ORAL_TABLET | Freq: Three times a day (TID) | ORAL | 1 refills | 20 days | Status: CP | PRN
Start: 2023-08-14 — End: ?

## 2023-08-14 NOTE — Unmapped (Signed)
Spoke with patient and her pain is worse from her last office visit she has found a ride to her appointment on 08/17/23. She is also going to bring her TENS unit to the appointment as she has questions about device.
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
## 2023-08-14 NOTE — Unmapped (Signed)
Refills approved for 2 months to get to physician visit. PDMP reviewed.

## 2023-08-15 DIAGNOSIS — N644 Mastodynia: Principal | ICD-10-CM

## 2023-08-15 DIAGNOSIS — Z803 Family history of malignant neoplasm of breast: Principal | ICD-10-CM

## 2023-08-15 DIAGNOSIS — Z1231 Encounter for screening mammogram for malignant neoplasm of breast: Principal | ICD-10-CM

## 2023-08-15 NOTE — Unmapped (Signed)
 Chronic Pain Follow Up Note    Assessment and Plan  Caitlin Smith is a 67 y.o.  being followed at [facility] clinic for complaint of chronic pain localized to right knee and bilateral neck, shoulders and upper back myofascial pain and fibromyalgia, and lower back 2/2 lumbar facet arthropathy and DDD and moderate degree of lumbar central spinal canal stenosis. Patient has a history of chronic pain related to lupus and arthritis and is on rheumatological therapy.     Bilateral shoulder pain  Ongoing, not main complaint today. Likely multifactorial, myofascial pain in setting of fibromyalgia and OA (radiographs with moderate AC OA). Denies weakness, tingling, radiculopathic pain.  - Continue Voltaren gel TID  - If pain worsening, referral to ortho for possible shoulder injections    Polyneuropathy   Patient endorses improvement in neuropathic pain in bilateral feet while taking Lyrica 75 mg BID.  Neuropathic symptoms improving; can consider further increasing dose if pain worsens. Can also trial Zynex for symptomatic relief.  - continue Lyrica 75 mg BID  - Continue topical lidocaine 5% BID, refilled  - Continue TENS unit + sock    Chronic pain syndrome; Fibromyalgia   Patient notes that Lyrica and Flexeril are helping with pain; symptoms are improved recently.   - Continue Flexeril 5 mg BID PRN, refilled  - Continue vanlafaxine per PCP  - Continue Trazodone 150 mg qhs per PCP  - Continue voltaren Gel  - Continue pain psychology.  - Continue PT and activity    Chronic lower back pain; Facet Arthropathy, likely SIJ Dysfunction   Secondary to lumbar facet arthropathy and DDD and moderate degree of lumbar central spinal canal stenosis. Previously had good relief with L4/5 and L5/S1 facet injections in the past (12/2020). Low back pain is worsened today most likely due to lumbar facet arthropathy with positive facet loading bilaterally. Discussed repeating facet injections; patient was amenable. Also, patient inquired about adjusting TENS unit today for her low back pain; we will place a referral to PT for her to adjust her TENS unit and work on myofascial pain on the back.   - Referral to PT for myofacial release and help with TENs unit use - provided patient with number for her specific TENs unit   - Ordered bilateral L3/4/5 MBB's with steroid   - Continue Voltaren gel as above    Requested Prescriptions     Signed Prescriptions Disp Refills    cyclobenzaprine (FLEXERIL) 5 MG tablet 120 tablet 1     Sig: Take 1 tablet (5 mg total) by mouth two (2) times a day as needed for muscle spasms.    lidocaine (XYLOCAINE) 5 % ointment 480 g 0     Sig: Apply topically four (4) times a day.    pregabalin (LYRICA) 75 MG capsule 120 capsule 1     Sig: Take 1 capsule (75 mg total) by mouth two (2) times a day.     Orders Placed This Encounter   Procedures    Facet Inj Lumbar/Sacral Single Level (16109)     Bilateral L3,L4,L5 MBB #1 with Fayrene Fearing   With steroid  No PIV     Standing Status:   Future     Expected Date:   08/17/2023     Expiration Date:   08/16/2024    Facet Inj Lumbar/Sacral 2nd Level (60454)     Bilateral L3,L4,L5 MBB #1 with Fayrene Fearing  With steroid   No PIV     Standing Status:  Future     Expected Date:   08/17/2023     Expiration Date:   08/16/2024    Ambulatory referral to Physical Therapy     Standing Status:   Future     Expected Date:   08/17/2023     Expiration Date:   08/16/2024     Referral Priority:   Routine     Referral Type:   Physical Therapy     Number of Visits Requested:   1     HPI  Caitlin Smith is a 67 y.o. being followed at [facility] for complaint of chronic pain localized to right knee and bilateral neck, shoulder, upper back, and lower back 2/2 lumbar facet arthropathy and DDD. Patient has a history of chronic pain related to lupus, arthritis, and fibromyalgia.     The patient was last seen in September at which point the patient reported myofascial pain in setting of fibromyalgia and OA (radiographs with moderate AC OA). Denied weakness, tingling, radiculopathic pain. Patient endorsed numbness in bilateral feet. Transitioned from gabapentin to Lyrica that she was able to escalate to 50 mg BID without any side effects and analgesic benefit. Neuropathic symptoms worsened and patient was interested in escalation of pregabalin dose. Can also trial Zynex for symptomatic relief. Patient noted that Lyrica and Flexeril were helping with pain, though symptoms worsened and regimen was less effective. Low back pain was secondary to SIJ and lumbar facet arthropathy and DDD and moderate degree of lumbar central spinal canal stenosis. Previously had good relief with L4/5 and L5/S1 facet injections in the past and may benefit from repeat injections.     Since last visit, the patient has followed with PT, Nephrology, Pain Psych, Cardiology, Internal Medicine and Pulmonology. The patient presented to the ED with atypical chest pain on 06/13/23 and MCV on 07/18/23.    Today, the patient presents to the clinic with her primary complaint being worsened low back pain. The patient states that the pain feels like her pain goes all the way up her back into her shoulders. The patient confirms that trying to get up and standing makes her pain worse. The patient reports that she had benefit from her facet injections in the past. Per chart review, the patient last had facet injection in April 2022. Although, the patient reports that the neuropathic pain in her feet has improved since last visit, though her hands have not gotten better.     Of note, the patient states that she meant to bring her TENS unit to the visit today because she's not sure it is working correctly. We discussed with the patient about seeing PT to discuss the settings and help her set up the TENS unit.      In terms of medications, the patient reports taking Lyrica 75 mg BID without any adverse side effects. The patient reports taking her current medication regimen with benefit.     The patient's medication regimen:  Cymbalta 120mg  am (PCP)  Trazodone 150 mg qhs (PCP)  Flexeril 5 mg BID prn  Voltaren gel prn  Lyrica 75 mg BID  Lidocaine ointment     Current view: Showing all answers           [facility] Return Patient Questionnaire       Question 08/13/2023  1:49 PM EST - Filed by Patient    What is the reason for your visit?       Date of  onset of your pain:       Please rate your pain at its WORST in the past month. 8    Please rate your pain at its LEAST in the past month. 7    Please rate your pain as it is RIGHT NOW. 9    Please rate your pain on AVERAGE in the past month. 8    Please circle the location of your pain.       Please select the words that describe your pain. Aching     Dull     Pressing     Sharp     shooting     Stabbing Tender Throbbing    How often do you have pain? All the time    When is your pain the worst?       Which of the following have been negatively affected by your pain? Enjoyment of life     Mood     Recreational activities     Walking     Sitting     Standing    Since your last visit:     Have you had any of the following? Primary Care Visit    Do you have any new pain you would like to discuss with your doctor? No    How has your pain changed? Worse    Are you currently taking any blood-thinners or anticoagulants? No    If you are on Pain Medication - Are you having any of the following? Confusion     Constipation     Agitation     Dizziness     Vomiting/Nausea    If you have had a procedure since your last visit, how much pain relief was obtained?       If you have had a procedure since your last visit, were there any complications?       General: Weight Loss/Gain     Night Sweats     Daytime Drowsiness    Cardiovascular: Chest Pain     Shortness of Breath with Exertion     Difficulty Breathing when Laying Flat    Gastrointestinal - (Intestinal): Nausea     Stomach Pain     Constipation    Skin: Itching     Dryness    Endocrine (Hormonal System):       Musculoskeletal System - (Muscles, Joints and Coverings): Joint Aches/Swelling     Spasms/Spasticity/Cramps     Back pain     Muscle Aches/Weakness    Neurologic: Dizziness/Vertigo     Coordination Difficulty     Numbness/Tingling    Psychiatric: Depression     Anxiety     Low Concentration     Panic Attacks     Lack of interest in activities             Patient denies homicidal/suicidal ideation.    Medication Monitoring  [place] database was reviewed and it was appropriate.  Last urine toxicology screen: N/A, not on opioids  The patient did not bring pill bottles today, not on opioids    Imaging   MRI lumbar spine 10/28/20  FINDINGS:  There is mild lumbar dextrorotatory scoliosis centered at the L2-L3 level. Trace retrolisthesis of L5 on S1. The remaining lumbar vertebral bodies are normally aligned.      Bone marrow signal intensity is normal. The visualized cord is unremarkable and the conus medullaris ends at a normal level.     A Schmorl's node is  noted within the inferior endplate of T11. Heights of the remaining vertebral bodies are preserved. There is multilevel disc desiccation. Mild loss of intervertebral disc height at L5-S1.      T12-L1 and L1-L2: No significant canal or neural foraminal stenosis. Mild facet degeneration and ligamentum flavum thickening.     L2-L3: Circumferential disc bulge with mild facet hypertrophy and ligamentum flavum thickening. No significant central canal stenosis. Mild right greater than left neural foraminal narrowing.      L3-L4: Circumferential disc bulge with mild facet arthropathy and moderate ligamentum flavum thickening. Mild central canal stenosis. Mild bilateral neural foraminal narrowing.     L4-L5: Circumferential disc bulge with a tiny, superimposed shallow central disc protrusion. Moderate facet hypertrophy and ligamentum flavum thickening. Mild central spinal canal narrowing. Mild left than right neural foraminal narrowing.     L5-S1: Mild circumferential disc bulge. Mild bilateral facet hypertrophy and ligamentum flavum thickening. No central canal stenosis. Mild bilateral neural foraminal narrowing.      The paraspinal tissues are within normal limits.     For the purposes of this dictation, the lowest well formed intervertebral disc space is assumed to be the L5-S1 level, and there are presumed to be five lumbar-type vertebral bodies.    IMPRESSION:  Multilevel degenerative lumbar spondylosis resulting in at most mild canal or central canal stenosis. Overall, findings are not significantly changed when compared to prior MRI from 2017.    Procedures  Bilateral Intra-articular Lumbar Facet Injections under Fluoroscopic Guidance at L4/5 and L5/S1with steroid 12/29/20      Allergies  Allergies   Allergen Reactions    Vilazodone Swelling    Ace Inhibitors Other (See Comments)     Pt states she can not take ace inhibitors. Pt states she can not remember her reaction.     Bee Pollen Itching     COUGH, WATERY EYES    Penicillin G Rash    Penicillins Rash    Pollen Extracts Itching     COUGHING, WATERY EYES       Home Medications    Current Outpatient Medications   Medication Sig Dispense Refill    albuterol HFA 90 mcg/actuation inhaler Inhale 2 puffs every six (6) hours as needed for wheezing. 8 g 11    amlodipine (NORVASC) 2.5 MG tablet Take 3 tablets (7.5 mg total) by mouth daily. 90 tablet 3    aspirin (ECOTRIN) 81 MG tablet Take 1 tablet (81 mg total) by mouth daily.      atenolol (TENORMIN) 25 MG tablet Take 1.5 tablets (37.5 mg total) by mouth two (2) times a day. 300 tablet 3    atorvastatin (LIPITOR) 40 MG tablet Take 1 tablet (40 mg total) by mouth daily.      biotin 1 mg cap Take by mouth.      buPROPion (WELLBUTRIN XL) 300 MG 24 hr tablet Take 1 tablet (300 mg total) by mouth daily. 90 tablet 3    carboxymethylcellulose sodium (ARTIFICIAL TEARS, [facility],) 1 % Drop Apply to eye.      clonazePAM (KLONOPIN) 0.5 MG tablet Take 0.5 tablets (0.25 mg total) by mouth Three (3) times a day as needed for anxiety. 30 tablet 1    clotrimazole-betamethasone (LOTRISONE) 1-0.05 % cream Apply 1 Application topically two (2) times a day.      diclofenac sodium (VOLTAREN) 1 % gel Apply 2 g topically two (2) times a day as needed for arthritis. 300 g 5    dicyclomine (BENTYL) 20  mg tablet Take 1 tablet (20 mg total) by mouth daily as needed.      famotidine (PEPCID) 20 MG tablet TAKE 1 TABLET BY MOUTH TWICE  DAILY 200 tablet 2    fluticasone propionate (FLONASE) 50 mcg/actuation nasal spray       hydrALAZINE (APRESOLINE) 10 MG tablet       hydroxychloroquine (PLAQUENIL) 200 mg tablet Take 1 tablet (200 mg total) by mouth two (2) times a day. 180 tablet 3    hydrOXYzine (ATARAX) 10 MG tablet       inhalational spacing device (AEROCHAMBER MV) Spcr 1 each by Miscellaneous route two (2) times a day. With Fovent 1 each 0    lamoTRIgine (LAMICTAL) 200 MG tablet TAKE 1 TABLET BY MOUTH TWICE  DAILY 200 tablet 2    lidocaine (XYLOCAINE) 5 % ointment Apply topically two (2) times a day. 100 g 2    linaCLOtide (LINZESS) 290 mcg capsule Take 1 capsule (290 mcg total) by mouth daily. 30 capsule 5    loratadine (CLARITIN) 10 mg tablet Take 1 tablet (10 mg total) by mouth daily.      melatonin 10 mg Tab Take 1 tablet by mouth.      metFORMIN (GLUCOPHAGE-XR) 500 MG 24 hr tablet Take 1 tablet (500 mg total) by mouth daily before breakfast.      mupirocin (BACTROBAN) 2 % ointment APP EXT IEN BID  0    mycophenolate (MYFORTIC) 360 MG TbEC Take 2 tablets (720 mg total) by mouth two (2) times a day. 360 tablet 3    olopatadine (PATANOL) 0.1 % ophthalmic solution Apply 1 drop to eye daily.      omeprazole (PRILOSEC) 40 MG capsule Take 1 capsule (40 mg total) by mouth two (2) times a day. 180 capsule 3    ondansetron (ZOFRAN) 4 MG tablet       polyethylene glycol (GLYCOLAX) 17 gram/dose powder Take 17 g by mouth daily.      predniSONE (DELTASONE) 1 MG tablet Take 1 tablet (1 mg total) by mouth daily. 60 tablet 1    telmisartan (MICARDIS) 80 MG tablet Take 1 tablet (80 mg total) by mouth daily. 90 tablet 3    traZODone (DESYREL) 100 MG tablet TAKE 1 AND 1/2 TABLETS BY MOUTH  EVERY NIGHT 135 tablet 3    cyclobenzaprine (FLEXERIL) 5 MG tablet Take 1 tablet (5 mg total) by mouth two (2) times a day as needed for muscle spasms. 120 tablet 1    lidocaine (XYLOCAINE) 5 % ointment Apply topically four (4) times a day. 480 g 0    pregabalin (LYRICA) 75 MG capsule Take 1 capsule (75 mg total) by mouth two (2) times a day. 120 capsule 1     No current facility-administered medications for this visit.     ROS  See above questionnaire.    Physical Exam    VITALS:   Vitals:    08/17/23 0824   BP: 131/75   Pulse: 62   Resp: 16   SpO2: 99%         Wt Readings from Last 3 Encounters:   08/17/23 85.8 kg (189 lb 1.6 oz)   06/26/23 87.1 kg (192 lb)   06/21/23 88.7 kg (195 lb 9.6 oz)     GENERAL:  The patient is well developed, well-nourished, and appears to be in no apparent distress.   HEAD/NECK:    Normocephalic/atraumatic. clear sclera, pupils not pinpoint  CV:  Warm and well perfused.  LUNGS:   Normal work of breathing, no supplemental O2  EXTREMITIES:  No clubbing, cyanosis noted.  NEUROLOGIC:    The patient is alert and oriented, speech fluent, normal language.   MUSCULOSKELETAL:    Motor function  preserved. Facet loading positive bilaterally. Lumbar muscles in spasm.   GAIT:  The patient rises from a seated position with no difficulty and ambulates with nonantalgic gait without the assistance of a walking aid.   SKIN:   No obvious rashes, lesions, or erythema.  PSY:   Appropriate affect. No overt pain behaviors. No evidence of psychomotor retardation or agitation, no signs of intoxication.      Documentation assistance was provided by Ermalinda Barrios Scribe, on August 17, 2023 at 8:47 AM for Dr. Susy Manor, DO.          Answers submitted by the patient for this visit:  Back Pain Questionnaire (Submitted on 08/15/2023)  Chief Complaint: Back pain  Chronicity: chronic  Onset: more than 1 year ago  Frequency: every several days  Progression since onset: unchanged  Pain location: gluteal, lumbar spine, sacro-iliac, thoracic spine  Pain quality: aching, shooting, stabbing  Pain - numeric: 7/10  Pain is: the same all the time  Aggravated by: sitting, standing, twisting  Stiffness is present: all day  bowel incontinence: Yes  chest pain: Yes  headaches: Yes  leg pain: Yes  numbness: Yes  paresthesias: Yes  weight loss: Yes

## 2023-08-17 ENCOUNTER — Ambulatory Visit: Admit: 2023-08-17 | Discharge: 2023-08-18 | Payer: MEDICARE

## 2023-08-17 DIAGNOSIS — G894 Chronic pain syndrome: Secondary | ICD-10-CM | POA: Diagnosis not present

## 2023-08-17 DIAGNOSIS — M150 Primary generalized (osteo)arthritis: Secondary | ICD-10-CM | POA: Diagnosis not present

## 2023-08-17 DIAGNOSIS — M48061 Spinal stenosis, lumbar region without neurogenic claudication: Secondary | ICD-10-CM | POA: Diagnosis not present

## 2023-08-17 DIAGNOSIS — M797 Fibromyalgia: Secondary | ICD-10-CM | POA: Diagnosis not present

## 2023-08-17 MED ORDER — LIDOCAINE 5 % TOPICAL OINTMENT
Freq: Four times a day (QID) | TOPICAL | 0 refills | 0.00 days | Status: CP
Start: 2023-08-17 — End: 2023-11-15

## 2023-08-17 MED ORDER — CYCLOBENZAPRINE 5 MG TABLET
ORAL_TABLET | Freq: Two times a day (BID) | ORAL | 1 refills | 60.00 days | Status: CP | PRN
Start: 2023-08-17 — End: 2023-12-15

## 2023-08-17 MED ORDER — PREGABALIN 75 MG CAPSULE
ORAL_CAPSULE | Freq: Two times a day (BID) | ORAL | 1 refills | 60.00 days | Status: CP
Start: 2023-08-17 — End: 2023-12-15

## 2023-08-17 NOTE — Unmapped (Addendum)
It was a pleasure seeing you in clinic today.   We have refilled your muscle relaxer and Lyrica and the lidocaine ointment. We have referred you to physical therapy to teach you how to use the TENs unit and work on the back muscles.   We have ordered the back injections.

## 2023-08-20 NOTE — Unmapped (Signed)
 I saw and evaluated the patient, participating in the key portions of the service.  I reviewed the resident???s note.  I agree with the resident???s findings and plan.

## 2023-08-22 NOTE — Unmapped (Signed)
[facility] Clinical Assessment & Refill Coordination Note    Caitlin Smith, DOB: March 04, 1956  Phone: [phone] (home)     All above HIPAA information was verified with patient.     Was a Nurse, learning disability used for this call? No    Specialty Medication(s):   Inflammatory Disorders: mycophenolate     Current Outpatient Medications   Medication Sig Dispense Refill    albuterol HFA 90 mcg/actuation inhaler Inhale 2 puffs every six (6) hours as needed for wheezing. 8 g 11    amlodipine (NORVASC) 2.5 MG tablet Take 3 tablets (7.5 mg total) by mouth daily. 90 tablet 3    aspirin (ECOTRIN) 81 MG tablet Take 1 tablet (81 mg total) by mouth daily.      atenolol (TENORMIN) 25 MG tablet Take 1.5 tablets (37.5 mg total) by mouth two (2) times a day. 300 tablet 3    atorvastatin (LIPITOR) 40 MG tablet Take 1 tablet (40 mg total) by mouth daily.      biotin 1 mg cap Take by mouth.      buPROPion (WELLBUTRIN XL) 300 MG 24 hr tablet Take 1 tablet (300 mg total) by mouth daily. 90 tablet 3    carboxymethylcellulose sodium (ARTIFICIAL TEARS, CMC,) 1 % Drop Apply to eye.      clonazePAM (KLONOPIN) 0.5 MG tablet Take 0.5 tablets (0.25 mg total) by mouth Three (3) times a day as needed for anxiety. 30 tablet 1    clotrimazole-betamethasone (LOTRISONE) 1-0.05 % cream Apply 1 Application topically two (2) times a day.      cyclobenzaprine (FLEXERIL) 5 MG tablet Take 1 tablet (5 mg total) by mouth two (2) times a day as needed for muscle spasms. 120 tablet 1    diclofenac sodium (VOLTAREN) 1 % gel Apply 2 g topically two (2) times a day as needed for arthritis. 300 g 5    dicyclomine (BENTYL) 20 mg tablet Take 1 tablet (20 mg total) by mouth daily as needed.      famotidine (PEPCID) 20 MG tablet TAKE 1 TABLET BY MOUTH TWICE  DAILY 200 tablet 2    fluticasone propionate (FLONASE) 50 mcg/actuation nasal spray       hydrALAZINE (APRESOLINE) 10 MG tablet       hydroxychloroquine (PLAQUENIL) 200 mg tablet Take 1 tablet (200 mg total) by mouth two (2) times a day. 180 tablet 3    hydrOXYzine (ATARAX) 10 MG tablet       inhalational spacing device (AEROCHAMBER MV) Spcr 1 each by Miscellaneous route two (2) times a day. With Fovent 1 each 0    lamoTRIgine (LAMICTAL) 200 MG tablet TAKE 1 TABLET BY MOUTH TWICE  DAILY 200 tablet 2    lidocaine (XYLOCAINE) 5 % ointment Apply topically four (4) times a day. 480 g 0    linaCLOtide (LINZESS) 290 mcg capsule Take 1 capsule (290 mcg total) by mouth daily. 30 capsule 5    loratadine (CLARITIN) 10 mg tablet Take 1 tablet (10 mg total) by mouth daily.      melatonin 10 mg Tab Take 1 tablet by mouth.      metFORMIN (GLUCOPHAGE-XR) 500 MG 24 hr tablet Take 1 tablet (500 mg total) by mouth daily before breakfast.      mupirocin (BACTROBAN) 2 % ointment APP EXT IEN BID  0    mycophenolate (MYFORTIC) 360 MG TbEC Take 2 tablets (720 mg total) by mouth two (2) times a day. 360 tablet  3    olopatadine (PATANOL) 0.1 % ophthalmic solution Apply 1 drop to eye daily.      omeprazole (PRILOSEC) 40 MG capsule Take 1 capsule (40 mg total) by mouth two (2) times a day. 180 capsule 3    ondansetron (ZOFRAN) 4 MG tablet       polyethylene glycol (GLYCOLAX) 17 gram/dose powder Take 17 g by mouth daily.      predniSONE (DELTASONE) 1 MG tablet Take 1 tablet (1 mg total) by mouth daily. 60 tablet 1    pregabalin (LYRICA) 75 MG capsule Take 1 capsule (75 mg total) by mouth two (2) times a day. 120 capsule 1    telmisartan (MICARDIS) 80 MG tablet Take 1 tablet (80 mg total) by mouth daily. 90 tablet 3    traZODone (DESYREL) 100 MG tablet TAKE 1 AND 1/2 TABLETS BY MOUTH  EVERY NIGHT 135 tablet 3     No current facility-administered medications for this visit.        Changes to medications: [name] reports no changes at this time.    Allergies   Allergen Reactions    Vilazodone Swelling    Ace Inhibitors Other (See Comments)     Pt states she can not take ace inhibitors. Pt states she can not remember her reaction. Bee Pollen Itching     COUGH, WATERY EYES    Penicillin G Rash    Penicillins Rash    Pollen Extracts Itching     COUGHING, WATERY EYES       Changes to allergies: No    SPECIALTY MEDICATION ADHERENCE     mycophenolate 360 mg: 10-14 days of medicine on hand       Medication Adherence    Patient reported X missed doses in the last month: 0  Specialty Medication: mycophenolate 360mg   Patient is on additional specialty medications: No  Informant: patient          Specialty medication(s) dose(s) confirmed: Regimen is correct and unchanged.     Are there any concerns with adherence? No    Adherence counseling provided? Not needed    CLINICAL MANAGEMENT AND INTERVENTION      Clinical Benefit Assessment:    Do you feel the medicine is effective or helping your condition? Yes    Clinical Benefit counseling provided? Not needed    Adverse Effects Assessment:    Are you experiencing any side effects? No    Are you experiencing difficulty administering your medicine? No    Quality of Life Assessment:    Quality of Life    Rheumatology  Oncology  Dermatology  Cystic Fibrosis          How many days over the past month did your SLE  keep you from your normal activities? For example, brushing your teeth or getting up in the morning. 0    Have you discussed this with your provider? Not needed    Acute Infection Status:    Acute infections noted within Epic:  No active infections  Patient reported infection: None    Therapy Appropriateness:    Is therapy appropriate based on current medication list, adverse reactions, adherence, clinical benefit and progress toward achieving therapeutic goals? Yes, therapy is appropriate and should be continued     DISEASE/MEDICATION-SPECIFIC INFORMATION      N/A    Chronic Inflammatory Diseases: Have you experienced any flares in the last month? No    PATIENT SPECIFIC NEEDS     Does the patient  have any physical, cognitive, or cultural barriers? No    Is the patient high risk? No    Did the patient require a clinical intervention? No    Does the patient require physician intervention or other additional services (i.e., nutrition, smoking cessation, social work)? No    SOCIAL DETERMINANTS OF HEALTH     At the [facility], we have learned that life circumstances - like trouble affording food, housing, utilities, or transportation can affect the health of many of our patients.   That is why we wanted to ask: are you currently experiencing any life circumstances that are negatively impacting your health and/or quality of life? No    Social Drivers of Health     Food Insecurity: No Food Insecurity (01/13/2023)    Received from [facility], [facility]    Hunger Vital Sign     Worried About Running Out of Food in the Last Year: Never true     Ran Out of Food in the Last Year: Never true   Internet Connectivity: Not on file   Housing/Utilities: Low Risk  (07/30/2022)    Housing/Utilities     Within the past 12 months, have you ever stayed: outside, in a car, in a tent, in an overnight shelter, or temporarily in someone else's home (i.e. couch-surfing)?: No     Are you worried about losing your housing?: No     Within the past 12 months, have you been unable to get utilities (heat, electricity) when it was really needed?: No   Tobacco Use: Low Risk  (08/17/2023)    Patient History     Smoking Tobacco Use: Never     Smokeless Tobacco Use: Never     Passive Exposure: Not on file   Transportation Needs: Unmet Transportation Needs (07/30/2022)    PRAPARE - Therapist, art (Medical): Not on file     Lack of Transportation (Non-Medical): Yes   Alcohol Use: Not on file   Interpersonal Safety: Not on file   Physical Activity: Sufficiently Active (08/19/2021)    Received from [facility], [facility]    Exercise Vital Sign     Days of Exercise per Week: 5 days     Minutes of Exercise per Session: 30 min   Intimate Partner Violence: Not At Risk (01/13/2023)    Received from [facility], [facility]    Humiliation, Afraid, Rape, and Kick questionnaire     Fear of Current or Ex-Partner: No     Emotionally Abused: No     Physically Abused: No     Sexually Abused: No   Stress: Stress Concern Present (08/19/2021)    Received from [facility]    [facility] - Occupational Stress Questionnaire     Feeling of Stress : To some extent   Substance Use: Not on file (07/10/2023)   Social Connections: Moderately Integrated (08/19/2021)    Received from [facility], [facility]    Social Connection and Isolation Panel [NHANES]     Frequency of Communication with Friends and Family: More than three times a week     Frequency of Social Gatherings with Friends and Family: Once a week     Attends Religious Services: More than 4 times per year     Active Member of [employer] or Organizations: Yes     Attends Banker Meetings: More than 4 times per year     Marital  Status: Divorced   Programmer, applications: Medium Risk (07/30/2022)    Overall Financial Resource Strain (CARDIA)     Difficulty of Paying Living Expenses: Somewhat hard   Depression: Not at risk (04/12/2020)    PHQ-2     PHQ-2 Score: 0   Health Literacy: Not on file       Would you be willing to receive help with any of the needs that you have identified today? Not applicable       SHIPPING     Specialty Medication(s) to be Shipped:   Inflammatory Disorders: mycophenolate    Other medication(s) to be shipped: No additional medications requested for fill at this time     Changes to insurance: No    Delivery Scheduled: Yes, Expected medication delivery date: 12/20.     Medication will be delivered via Next Day Courier to the confirmed prescription address in [facility].    The patient will receive a drug information handout for each medication shipped and additional FDA Medication Guides as required.  Verified that patient has previously received a Conservation officer, historic buildings and a Surveyor, mining.    The patient or caregiver noted above participated in the development of this care plan and knows that they can request review of or adjustments to the care plan at any time.      All of the patient's questions and concerns have been addressed.    Julianne Rice, PharmD   [facility] Specialty Pharmacist

## 2023-08-23 ENCOUNTER — Encounter: Payer: Self-pay | Admitting: Family Medicine

## 2023-08-24 MED FILL — MYCOPHENOLATE SODIUM 360 MG TABLET,DELAYED RELEASE: ORAL | 90 days supply | Qty: 360 | Fill #1

## 2023-08-28 ENCOUNTER — Encounter: Payer: Self-pay | Admitting: Family Medicine

## 2023-08-30 DIAGNOSIS — M170 Bilateral primary osteoarthritis of knee: Secondary | ICD-10-CM | POA: Diagnosis not present

## 2023-08-30 DIAGNOSIS — M19019 Primary osteoarthritis, unspecified shoulder: Secondary | ICD-10-CM | POA: Diagnosis not present

## 2023-08-30 DIAGNOSIS — G629 Polyneuropathy, unspecified: Secondary | ICD-10-CM | POA: Diagnosis not present

## 2023-09-04 NOTE — Unmapped (Signed)
[facility] Triage Note    Reason for call: Return call    Time for incident: 1336 - 1345    Next clinic visit scheduled for 09/22/23  Mammo/US scheduled 09/21/23     Phone Assessment: Spoke with pt regarding call request.  Confirmed upcoming appts times, and check-in times.  Confirmed that pt will get diagnostic mammo and [place].     Triage Recommendations: none - pt will be at scheduled imaging appt and follow up with provider.     Caller's Response: Thank you so much.     Outstanding tasks: None

## 2023-09-04 NOTE — Unmapped (Unsigned)
Pt has injections scheduled 1/24

## 2023-09-04 NOTE — Unmapped (Signed)
[facility] Other Call ONC Phone Room Smart Lists: General -     Hi,     Heldana contacted the Communication Center regarding the following:    - Requesting diagnostic mammogram, pain under arm and breast on right side.    Please contact [facility] at [phone].    Thanks in advance,    Yehuda Mao  [facility]   [phone]    [facility]

## 2023-09-07 ENCOUNTER — Other Ambulatory Visit: Payer: Self-pay | Admitting: Family Medicine

## 2023-09-07 DIAGNOSIS — I10 Essential (primary) hypertension: Secondary | ICD-10-CM

## 2023-09-08 ENCOUNTER — Other Ambulatory Visit: Payer: Self-pay | Admitting: Family Medicine

## 2023-09-08 DIAGNOSIS — I10 Essential (primary) hypertension: Secondary | ICD-10-CM

## 2023-09-08 DIAGNOSIS — I700 Atherosclerosis of aorta: Secondary | ICD-10-CM

## 2023-09-10 MED ORDER — BUPROPION HCL XL 300 MG 24 HR TABLET, EXTENDED RELEASE
ORAL_TABLET | Freq: Every day | ORAL | 3 refills | 0.00 days
Start: 2023-09-10 — End: ?

## 2023-09-11 ENCOUNTER — Encounter
Admit: 2023-09-11 | Discharge: 2023-09-12 | Payer: MEDICARE | Attending: Student in an Organized Health Care Education/Training Program | Primary: Student in an Organized Health Care Education/Training Program

## 2023-09-11 DIAGNOSIS — J8489 Other specified interstitial pulmonary diseases: Secondary | ICD-10-CM | POA: Diagnosis not present

## 2023-09-11 DIAGNOSIS — M359 Systemic involvement of connective tissue, unspecified: Secondary | ICD-10-CM | POA: Diagnosis not present

## 2023-09-11 MED ORDER — BUPROPION HCL XL 300 MG 24 HR TABLET, EXTENDED RELEASE
ORAL_TABLET | Freq: Every day | ORAL | 3 refills | 90.00 days | Status: CP
Start: 2023-09-11 — End: ?

## 2023-09-11 NOTE — Unmapped (Signed)
Pulmonary Clinic - Follow-up Virtual Visit      HISTORY:     Active Pulmonary Problems & Brief History:  Caitlin Smith is a 68 y.o. woman with many medical issues here for follow up of follow up for organizing pneumonia.     Interval History:  - 2016 and prior - patient followed at [facility]. They thought her symptoms of dyspnea were from obesity and asthma and recommended ENT evaluation. They also stated concern for chronic aspiration.   - 04/2016 - patient in [facility] system. She was thought to have multifactorial dyspnea, waxing and waning pulmonary opacities. Evaluated by pulmonary hypertension team without evidence of pulmonary hypertension. Underwent bronchoscopy with BAL and transbronchial biopsies which showed significant inflammation. DDX remained broad - CEP, COP, SLE pneumonitis, ABPA, EGPA, AEP, but it was decided to treat her with prolonged steroid taper.   - 08/23/16 - last visit with Dr. Johnnette Barrios - was improving with steroid taper   - 10/20/16 - established with Dr. Nilsa Nutting - there had been question of compliance with her prednisone since last visit, but by time of visit she had been on it consistently for one month. No clear improvement in dyspnea, but remains inactive.   - 02/09/17 - follow up - spirometry showed improvement in FVC and DLCOafter 2 months of steroid, had tapered off by time of visit. When she stopped the prednisone she developed increased cough, chest tightness, dyspnea.   - 03/27/17 - follow up - given steroid dependence was placed on MMF.   - 10/16/17 - follow up - dose reduced MMF for GI side effects. Prolonged steroid taper over this time.   - 02/06/2019 - last visit with pulmonary, Dr. Judeth Horn - at that time had gone to local ED for chest pain, CT chest showed interstitial lung disease in the lung bases, favored to reflect mild progression of chronic fibrotic nonspecific interstitial pneumonia (NSIP). It was unclear to Dr. Judeth Horn if that represented actual progression of her underlying disease.   - 03/03/21 - initial visit with me - DOE w/o chest pain. No inhalers. Chronic dry cough thought to be from reflux.  - 06/08/21 - ENT - larynx healthy on laryngoscopy. Referred to GI for GI discomfort and constipation.   - 07/04/21 - follow up - exercise increased to 50 minutes daily, acute worsening 2 weeks prior, evaluation with CT chest, echocardiogram, unremarkable. Chest fluoroscopy with decreased, but normal, movement of left hemidiaphragm.   - 09/13/20 - follow up - had URI over Christmas and New Year treat with doxycycline and followed with lingering hoarseness, productive cough. Increased MMF.   - 12/29/21 - follow up - very easily dyspneic, even just walking to the car or climbing 12 stairs into house. Less active overall. Not as much coughing, allergies are improved. No heartburn with acid reflux medication.   - 02/16/22 - phone visit - PFTs improved from April, continued pred 10mg  daily, MMF 720 BID, HCQ 200mg  BID.   - 04/20/22 - follow up - some palpitations, some dyspnea, some dry cough associated with nausea and dizziness. No clubbing or LE edema. MMF 720mg  BID, HCQ 200mg  BID, prednisone 7.5mg  daily.   - 06/09/22 - ENT - PPI ineffective for cough.Trial of right SLN injection for neurogenic cough.  - 09/12/21 - follow up - doing pulmonary rehab, exercising 5 days a week, walking an hour a day, significant cough worst at night. No changes to treatment.   - 10/19/22 - follow up - nerve injection significantly  helped cough. Exercising without difficulty. DOE climbing stairs.   - 01/25/23 - follow up - significant anxiety about unintentional 40 pound weight loss. Some dyspnea with faster walking, climbing stairs. Still ongoing cough that returned a month after the injection. Is able to walk an hour every morning, 5 days per week. Taking 5mg  daily of prednisone (at night), mycophenolate 720mg  BID, HCQ 200mg  BID. Recommended to taper prednisone very slowly over the next few months, continue MMF/HCQ  - 06/26/23 - follow-up. Was doing well walking 5x/wk, then had to cut back to 3x/wk. She gets dyspneic with walking too long. She finds it hard to talk for long periods of time. She is on prednisone 1mg  currently. She is not sure that tapering the prednisone made her more dyspneic. Having to pace herself. She has been on prednisone for many years - on varying doses. Cough stable, no fevers. Hoarseness has come back some  - did an injection with ENT. Dry cough. Normally does not use albuterol but now using it more often though it does not seem to work. She has an old flovent rx that she has been using which used to help, but no longer. She does check her O2 number at home- gets down to 90 with walking. She has done pulmonary rehab in the past at [facility]. She is overwhelmed by the # of appts. Would like virtual visit next time. Tearful today.   - 09/10/23 - video visit follow up - She is feeling under the weather today and having some back pain. Lungs not bothering her much right now. Reassured with breathing tests looking better. Her back pain is limiting her exercise right now so she is not walking very much. She stopped the prednisone and is on myfortic and breathing overall seems better. She is not sure why but wonders if it is related to exercising and eating healthier. She is also doing some breathing exercises. She does still have some chest pains that bother her - happens for a few seconds, 1-2x/day or sometimes not at all, like a nagging toothache, non-exertional, never lasts for more than a few seconds, not related to meals. The fluttering sensation in her chest has resolved with atenolol. Taking her GERD medicine. No pain with inspiration, no cough. Seeing ENT ([place]) because she is having a dry cough to see if she needs injections again - dry cough with hoarseness, not a lot of phlegm. Currently under the weather (worsened cough) but says this has been this way for some time and her last injection with ENT resolved her symptoms.     Past Medical History: The medical and surgical history were personally reviewed and updated in the patient's electronic medical record. Pertinent positives are documented above.  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 Pertinent positives are documented above.  Home Medications: Medications were reviewed and updated in the patient's electronic medical record. Pertinent positives are documented above.   Allergies:  Allergies were reviewed and updated in the patient's electronic medical record. Pertinent positives are documented above.  Review of Systems: A comprehensive review of systems was completed and negative except as noted in HPI.    PHYSICAL EXAM:     Virtual Visit- no vitals.     General: alert, appears stated age, NAD  Pulm: comfortable respirations on room air, occasional dry/hoarse cough     LABORATORY and RADIOLOGY DATA:     Pulmonary Function Tests/Interpretation:  02/14/23      08/03/22      - HR inappropriately low with exercise  (resting 59, peak 84)    Pertinent Laboratory Data:  02/08/22 - Hg 13.4, absolute lymphocyte 3.8    04/25/16 BAL and TBBX   Cultures negative except for OPF     Cell count  PMN 31%  Lymph 11%  Macs 43%  Eos 7%  Baso 6%     Pathology   A: Lung, right lower lobe, endobronchial biopsy   - DIP-like reaction pattern with admixed eosinophils, consistent with eosinophilic pneumonia (see Micro Exam)  - Patchy organizing pneumonia  - Interstitial lymphoplasmacytic infiltrate, polytypic  - Partially denuded benign respiratory mucosa, without viral cytopathic effect  - No malignant neoplasm identified   - GMS and  AFB stains negative for fungi or AFB    Cytology  Lung, Bronchial lavage:  - No malignant cells identified.   - Alveolar macrophages and mixed inflammation.     Pertinent Imaging Data:  07/05/23: Stable appearance of fibrotic changes related to interstitial lung disease associated with connective tissue disease.     09/15/2022 Barium Swallow Double Contrast   Esophageal dysmotility.   Gastroesophageal reflux.   Elevation of the left hemidiaphragm.       06/29/22 CXR  Hypoinflated lungs with no acute abnormalities.     08/02/21 HRCT Chest  Slight worsening of fibrotic changes related to  interstitial lung disease associated with connective tissue disease.       Resolution of right lower lobe consolidation with central clearance consistent with organizing pneumonia.     08/26/21 Sniff Test  Elevated left hemidiaphragm with slightly diminished excursion relative to the right but no diaphragmatic paralysis.    Pertinent Cardiac Data:  05/26/22 Zio   Patient had a min HR of 52 bpm, max HR of 123 bpm, and avg HR of 67 bpm. Predominant underlying rhythm was Sinus Rhythm. Isolated SVEs were rare (<1.0%), and no SVE Couplets or SVE Triplets were present. Isolated VEs were rare (<1.0%), and no VE Couplets or VE Triplets were present.   Symptoms were associated with sinus rhythm and also isolated VEs.     08/27/21 TTE     1. The left ventricular systolic function is normal, LVEF is visually estimated at 60-65%.    2. The right ventricle is normal in size, with normal systolic function.    3. The pulmonary systolic pressure cannot be estimated due to insufficient TR signal.      ASSESSMENT and PLAN     Problem List  Connective Tissue Disease related Interstitial Lung Disease (CTD-ILD)  Cryptogenic Organizing Pneumonia (COP)  SLE   BMI 29  Left hemidiaphragm elevation w/ possible shrinking lung disease   Esophageal dysmotility with GERD    Assessment  Mrs. Verga Dabney is an older woman with hx of organizing vs eosinophilic PNA and now CT-ILD/post-inflammatory fibrosis in the setting of known SLE (follows with Dr. Scarlette Calico) who has been managed over the last several  years on prednisone, mycophenolate, and hydroxychloroquine. She has been weaning off of prednisone since 01/2023 in the setting of inverse psoriasis and to prevent other complications, now stopped as of 06/2023. Recent monitoring labs with rheumatology were within normal limits.     At her last visit, she was quite dyspneic with exertion and fatigued; we did a relatively broad workup that showed overall stable mild fibrotic changes in her lungs via HRCT, improving PFTs (really overall looking progressively better since 12/2021), echo G1DD. Her did not demonstrate any need for supplemental O2 but she did have an inappropriately low increase in her HR (59->80s with exertion). Her symptoms self-resolved (maybe with some dietary/exercise changes) and her only complaint today is a hoarse, dry cough for which she is seeing Dr. Vergie Living in Feb to discuss repeat laryngeal nerve injection.     I do think her symptoms might be related in part to reflux and we discussed conservative measures for this. We will not make any changes today. I do note a minimal HR increase during her which I suspect is related to her atenolol. This is helping with her history of symptomatic PVCs so we will leave as-is for now.       Plan  - Continue mycophenolate 720mg  BID, HCQ 200mg  BID.Off prednisone.   - Virtual visit in 6 months  with me, then 6 months later with PFTs in person  - We will have her repeat FVL, DLCO, plethysmography ~1 yr from prior (08/2024)  - could consider flovent MDI based on historical symptomatic benefit if cough not improving with ENT eval and GERD management  - consider decreasing atenolol if worsening DOE/exercise intolerance - would talk with cardiologist first   [ ]  ask about OSA sxs at follow-up     The patient was seen with Dr. Orson Aloe and will for virtual visit in 6 months.     Kandis Fantasia, MD  Pulmonary and Critical Care Fellow

## 2023-09-11 NOTE — Unmapped (Addendum)
It was a pleasure seeing you in clinic today! You were seen in clinic today by Dr. Desiree Hane & Dr. Orson Aloe      Here's a summary of what we discussed during your visit:  Let me know if your shortness of breath comes back  If injections/ENT evaluation is not able to help the cough, we can discuss adding back flovent  Keep up the good work with exercising and eating healthily      For prevention of nighttime aspiration and reflux, recommend:  Elevate head of bed. Goal is to elevate your body from waist up; raising your head with additional pillows isn't effective. Consider use of a foam wedge pillow (e.g. [employer] : foam wedge pillows)  Avoid eating or drinking large volumes within 2-3 hours of reclining  Lying on left side makes reflux less likely  Avoid foods and drinks that trigger reflux. Common triggers include alcohol, chocolate, caffeine, fatty foods or peppermint     Follow up with me in 6 months!    Desiree Hane, MD  Pulmonary & Critical Care Fellow  [facility]  Phone: [phone]  Fax: [phone]    To contact your care team, you can either send a message via MyChart or contact the clinic at [phone].    How do I request medication refills?  Request a refill via [facility] (patient portal), call clinic at [phone] or have your pharmacy fax the request to [phone].    [facility]: [phone] *Pharmacy can mail medications to your home. You must call to request the medication be mailed.[facility]: [phone]  [facility]: [phone]    Here are some things you should know about contacting the [facility]:  Please be advised Epic now releases test results to MyChart as soon as they are available which means you will see your test results before I do.    The best way to reach your doctor for non-urgent matters is through MyChart. I can usually respond within two to three business day but I do not check messages after hours (evenings, weekends, and holidays) and often have other duties (inpatient hospital work, Producer, television/film/video activities, teaching) that make me unavailable.    - If you have sent a MyChart message to the clinic and have not received a response after three business days, please call our clinic at [phone] to speak to a nurse.     If you have an urgent issue that you feel needs a response the same day, you should also contact your primary care provider or be evaluated at an Urgent Care clinic.    For urgent lung issues after hours, you can call the hospital operator [phone]) and ask for the Pulmonary Fellow On Call. This doctor can provide some guidance and will send a message to your regular lung doctor the next morning.    If there is an emergency, call 911 or go to your closest emergency room.    I don't have a MyChart. Why should I get one?   - It's encrypted, so your information is secure  - It's a quick, easy way to contact the care team, manage appointments, see test results, and more!    How do I sign-up for MyChart?   - Download the MyChart app from the [employer] or [employer] and sign-up in the app  - Sign-up online at [email or web address]

## 2023-09-18 NOTE — Unmapped (Signed)
I saw and evaluated the patient, participating in the key portions of the service.  I reviewed the Fellow's (subspecialty resident’s) note.  I agree with the Fellow's findings and plan.     Star Cheese G Reyli Schroth, MD

## 2023-09-21 ENCOUNTER — Inpatient Hospital Stay: Admit: 2023-09-21 | Discharge: 2023-09-22 | Payer: MEDICARE

## 2023-09-21 ENCOUNTER — Ambulatory Visit: Admit: 2023-09-21 | Discharge: 2023-09-22 | Payer: MEDICARE

## 2023-09-21 DIAGNOSIS — I129 Hypertensive chronic kidney disease with stage 1 through stage 4 chronic kidney disease, or unspecified chronic kidney disease: Secondary | ICD-10-CM | POA: Diagnosis not present

## 2023-09-21 DIAGNOSIS — Z79624 Long term (current) use of inhibitors of nucleotide synthesis: Secondary | ICD-10-CM | POA: Diagnosis not present

## 2023-09-21 DIAGNOSIS — Z880 Allergy status to penicillin: Secondary | ICD-10-CM | POA: Diagnosis not present

## 2023-09-21 DIAGNOSIS — N181 Chronic kidney disease, stage 1: Secondary | ICD-10-CM | POA: Diagnosis not present

## 2023-09-21 DIAGNOSIS — L408 Other psoriasis: Secondary | ICD-10-CM | POA: Diagnosis not present

## 2023-09-21 DIAGNOSIS — Z7984 Long term (current) use of oral hypoglycemic drugs: Secondary | ICD-10-CM | POA: Diagnosis not present

## 2023-09-21 DIAGNOSIS — Z1231 Encounter for screening mammogram for malignant neoplasm of breast: Secondary | ICD-10-CM | POA: Diagnosis not present

## 2023-09-21 DIAGNOSIS — Z803 Family history of malignant neoplasm of breast: Secondary | ICD-10-CM | POA: Diagnosis not present

## 2023-09-21 DIAGNOSIS — M329 Systemic lupus erythematosus, unspecified: Secondary | ICD-10-CM | POA: Diagnosis not present

## 2023-09-21 DIAGNOSIS — M150 Primary generalized (osteo)arthritis: Secondary | ICD-10-CM | POA: Diagnosis not present

## 2023-09-21 DIAGNOSIS — Z792 Long term (current) use of antibiotics: Secondary | ICD-10-CM | POA: Diagnosis not present

## 2023-09-21 DIAGNOSIS — Z79899 Other long term (current) drug therapy: Secondary | ICD-10-CM | POA: Diagnosis not present

## 2023-09-21 DIAGNOSIS — M797 Fibromyalgia: Secondary | ICD-10-CM | POA: Diagnosis not present

## 2023-09-21 DIAGNOSIS — J8410 Pulmonary fibrosis, unspecified: Secondary | ICD-10-CM | POA: Diagnosis not present

## 2023-09-21 DIAGNOSIS — N644 Mastodynia: Secondary | ICD-10-CM | POA: Diagnosis not present

## 2023-09-21 DIAGNOSIS — Z7982 Long term (current) use of aspirin: Secondary | ICD-10-CM | POA: Diagnosis not present

## 2023-09-21 LAB — URINALYSIS WITH MICROSCOPY WITH CULTURE REFLEX PERFORMABLE
BACTERIA: NONE SEEN /HPF
BILIRUBIN UA: NEGATIVE
BLOOD UA: NEGATIVE
GLUCOSE UA: NEGATIVE
KETONES UA: NEGATIVE
NITRITE UA: NEGATIVE
PH UA: 6 (ref 5.0–9.0)
PROTEIN UA: 30 — AB
RBC UA: 2 /HPF (ref <=4)
SPECIFIC GRAVITY UA: 1.021 (ref 1.003–1.030)
SQUAMOUS EPITHELIAL: <1 /HPF (ref 0–5)
UROBILINOGEN UA: 2 — AB
WBC UA: 2 /HPF (ref 0–5)

## 2023-09-21 LAB — CBC W/ AUTO DIFF
BASOPHILS ABSOLUTE COUNT: 0.1 10*9/L (ref 0.0–0.1)
BASOPHILS RELATIVE PERCENT: 1.2 %
EOSINOPHILS ABSOLUTE COUNT: 0.1 10*9/L (ref 0.0–0.5)
EOSINOPHILS RELATIVE PERCENT: 2.8 %
HEMATOCRIT: 37.5 % (ref 34.0–44.0)
HEMOGLOBIN: 12.3 g/dL (ref 11.3–14.9)
LYMPHOCYTES ABSOLUTE COUNT: 1.5 10*9/L (ref 1.1–3.6)
LYMPHOCYTES RELATIVE PERCENT: 30.3 %
MEAN CORPUSCULAR HEMOGLOBIN CONC: 32.7 g/dL (ref 32.0–36.0)
MEAN CORPUSCULAR HEMOGLOBIN: 27.7 pg (ref 25.9–32.4)
MEAN CORPUSCULAR VOLUME: 84.8 fL (ref 77.6–95.7)
MEAN PLATELET VOLUME: 7.6 fL (ref 6.8–10.7)
MONOCYTES ABSOLUTE COUNT: 0.5 10*9/L (ref 0.3–0.8)
MONOCYTES RELATIVE PERCENT: 10.8 %
NEUTROPHILS ABSOLUTE COUNT: 2.7 10*9/L (ref 1.8–7.8)
NEUTROPHILS RELATIVE PERCENT: 54.9 %
PLATELET COUNT: 391 10*9/L (ref 150–450)
RED BLOOD CELL COUNT: 4.42 10*12/L (ref 3.95–5.13)
RED CELL DISTRIBUTION WIDTH: 14.6 % (ref 12.2–15.2)
WBC ADJUSTED: 5 10*9/L (ref 3.6–11.2)

## 2023-09-21 LAB — PROTEIN / CREATININE RATIO, URINE
CREATININE, URINE: 147.1 mg/dL
PROTEIN URINE: 33 mg/dL
PROTEIN/CREAT RATIO, URINE: 0.224

## 2023-09-21 LAB — BUN: BLOOD UREA NITROGEN: 8 mg/dL — ABNORMAL LOW (ref 9–23)

## 2023-09-21 LAB — CREATININE
CREATININE: 0.92 mg/dL (ref 0.55–1.02)
EGFR CKD-EPI (2021) FEMALE: 69 mL/min/{1.73_m2} (ref >=60)

## 2023-09-21 LAB — C4 COMPLEMENT: C4 COMPLEMENT: 22.7 mg/dL (ref 12.0–36.0)

## 2023-09-21 LAB — C3 COMPLEMENT: C3 COMPLEMENT: 125 mg/dL (ref 90–170)

## 2023-09-21 LAB — HM MAMMOGRAPHY

## 2023-09-21 NOTE — Unmapped (Addendum)
For the psoriasis in the folds of the skin, this is what the dermatologist recommended: Recommend a barrier cream (zinc oxide, Desitin) in skin fold daily.  You can buy these creams over the counter.    Do not use voltaren gel in the folds of the skin. It is used for pain, and to be put on the joints.     Referral sent to physical therapy for water therapy to help with pain from fibromyalgia.     When you wake up at night, try not to watch TV or use electronics like your phone.

## 2023-09-21 NOTE — Unmapped (Signed)
Rheumatology return visit    Chief Compliant: SLE, OA, fibromyalgia follow-up      HPI: Caitlin Smith is a 68 y.o. female who is here today for follow-up for SLE, OA, and fibromyalgia.  Initially diagnosed in 2006 with SLE presenting with episcleritis and arthralgia with serology showing +ANA, +dsDNA.  She also has stage I CKD felt secondary not due to SLE but due to HTN and obesity.   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  Treatment history:  - HCQ  - Previously treated with prednisone for eosinophilic pneumonitis with difficulty tapering due to pulm symptoms, so cellcept was added 2018.   - Cellcept changed to myfortic 2019 due to GI distress.   - tapered off prednisone in late 2019  - Derm eval in 07/2018 consistent with inverse psoriasis, possibly due to prednisone use. Recommended avoiding systemic steroids when possible.   - Myfortic increased to 720 mg BID in 09/2021 due to worsening ILD.   - Prednisone (plan to be taken indefinitely) resumed by pulmonology in 12/2021. Ultimately tapered off prednisone in 06/2023.  - Current med regimen: Myfortic 720 mg twice daily, Plaquenil 200 mg twice daily. Also followed by pain clinic for pharmacotherapy for fibromyalgia.     Interim history:  Presents today for f/u.     She has been going through a rough patch. Her pain is 8-9/10 today. Pain all over her body. She saw pain doctor 1 wk ago and was told that a muscle in her back was really tight. Given some topical lidocaine for the back. She does not feel that her pain medications are working for her pain. It is unclear if she discussed this concern with her pain doctor. She also mentions that she sent a message to her pain doctor about her pain being uncontrolled and she was told to f/u with our clinic about this. I reviewed the message sent to Dr Fayrene Fearing, and I am not sure that the question for her was clear.     Has had difficulty walking for exercise due to pain. We discussed a PT referral to help with activity and improving mobility. She is currently following PT for gait training, but would be interested in PT for pain reduction as well.   She notes a fall earlier this week due to imbalance. No LOC. Did not sustain any significant injury, but a lot of diffuse pain since the fall.   She notes poor sleep recently. Falls asleep ok, but the wakes up in the middle of the night and trouble falling asleep again. She typically watches TV when she wakes up.     No fevers or chills. Skin doing ok. She notes that she has persistent psoriasis under the breasts and in the groin area. Hx of inverse psoriasis in these areas. Has been using a cream from dermatology every day, but running out.       ROS??: Attests to the above, otherwise, review of ten system is negative.  ??  Past Medical and Surgical History:  ??  Patient Active Problem List    Diagnosis Date Noted    PVC (premature ventricular contraction) 08/03/2022    Immunocompromised (CMS-HCC) 04/29/2021    Otalgia 12/03/2019    Hyperglycemia 10/23/2017    Bronchiolitis obliterans organizing pneumonia (CMS-HCC) 09/29/2016    Vertigo 09/29/2016    Neck pain 12/15/2015    Fibromyalgia 10/12/2015    Chronic constipation 04/11/2015  Chronic recurrent major depressive disorder (CMS-HCC) 04/11/2015    Dyslipidemia 04/11/2015    Obesity, diabetes, and hypertension syndrome (CMS-HCC) 04/11/2015    Intertrigo 04/11/2015    Fibrositis 04/11/2015    Gastroesophageal reflux disease without esophagitis 04/11/2015    Vitamin D deficiency 04/11/2015    Seborrhea capitis 04/11/2015    Perennial allergic rhinitis with seasonal variation 04/11/2015    Moderate dysplasia of vulva 04/11/2015    Spinal stenosis of lumbar region 04/11/2015    Keratosis pilaris 04/11/2015    Insomnia, persistent 04/11/2015    Auditory impairment 04/11/2015    Eczema intertrigo 04/11/2015    Neurogenic claudication 04/11/2015 Screening for cardiovascular condition 03/05/2015    Encounter for screening for cardiovascular disorders 03/05/2015    Treadmill stress test negative for angina pectoris 03/05/2015    Cough 05/20/2014    Shortness of breath 05/20/2014    Regurgitation 04/24/2014    Epigastric burning sensation 04/24/2014    Obesity with body mass index 30 or greater 02/03/2014    S/P total knee replacement 12/31/2013    Asthma 12/31/2013    Asthma, chronic 12/31/2013    H/O total knee replacement 12/31/2013    SLE (systemic lupus erythematosus) (CMS-HCC) 11/28/2013    Episcleritis 09/26/2013    Other diseases of vocal cords 05/28/2013    Vocal cord dysfunction 05/28/2013    Generalized anxiety disorder 03/19/2013    Pain medication agreement signed 02/12/2013    Family history of breast cancer     VIN II (vulvar intraepithelial neoplasia II) 01/25/2013    Back pain 12/18/2012    Multiple pulmonary nodules 11/26/2012    Lung nodule, multiple 11/26/2012    Pulmonary nodules 11/26/2012    Osteoarthrosis 11/01/2012    Osteoarthritis 11/01/2012    Arthritis, degenerative 11/01/2012    Postinflammatory pulmonary fibrosis (CMS-HCC) 10/22/2012    Aspiration pneumonitis (CMS-HCC) 10/22/2012    H/O aspiration pneumonitis 10/22/2012    Mixed urge and stress incontinence 05/04/2012    Dysphonia 03/22/2012    Sensorineural hearing loss 03/22/2012    Myalgia and myositis 02/25/2011    Benign neoplasm of pituitary gland and craniopharyngeal duct (pouch) (CMS-HCC) 12/29/2010    Chronic kidney disease, stage I 12/08/2010    Proteinuria 12/08/2010    Lung involvement in systemic lupus erythematosus (CMS-HCC) 11/05/2010    Chronic nausea 07/01/2008    Hypertension, benign 01/25/2005     Allergies:   ??  Allergies   Allergen Reactions    Vilazodone Swelling    Ace Inhibitors Other (See Comments)     Pt states she can not take ace inhibitors. Pt states she can not remember her reaction.     Bee Pollen Itching     COUGH, WATERY EYES    Penicillin G Rash    Penicillins Rash    Pollen Extracts Itching     COUGHING, WATERY EYES     Current Outpatient Medications:  ??  Current Outpatient Medications on File Prior to Visit   Medication Sig Dispense Refill    albuterol HFA 90 mcg/actuation inhaler Inhale 2 puffs every six (6) hours as needed for wheezing. 8 g 11    amlodipine (NORVASC) 2.5 MG tablet Take 3 tablets (7.5 mg total) by mouth daily. 90 tablet 3    aspirin (ECOTRIN) 81 MG tablet Take 1 tablet (81 mg total) by mouth daily.      atenolol (TENORMIN) 25 MG tablet Take 1.5 tablets (37.5 mg total) by mouth two (2) times a day. 300 tablet  3    atorvastatin (LIPITOR) 40 MG tablet Take 1 tablet (40 mg total) by mouth daily.      biotin 1 mg cap Take by mouth.      buPROPion (WELLBUTRIN XL) 300 MG 24 hr tablet TAKE 1 TABLET BY MOUTH EVERY DAY 90 tablet 3    carboxymethylcellulose sodium (ARTIFICIAL TEARS, CMC,) 1 % Drop Apply to eye.      clonazePAM (KLONOPIN) 0.5 MG tablet Take 0.5 tablets (0.25 mg total) by mouth Three (3) times a day as needed for anxiety. 30 tablet 1    clotrimazole-betamethasone (LOTRISONE) 1-0.05 % cream Apply 1 Application topically two (2) times a day.      cyclobenzaprine (FLEXERIL) 5 MG tablet Take 1 tablet (5 mg total) by mouth two (2) times a day as needed for muscle spasms. 120 tablet 1    diclofenac sodium (VOLTAREN) 1 % gel Apply 2 g topically two (2) times a day as needed for arthritis. 300 g 5    dicyclomine (BENTYL) 20 mg tablet Take 1 tablet (20 mg total) by mouth daily as needed.      famotidine (PEPCID) 20 MG tablet TAKE 1 TABLET BY MOUTH TWICE  DAILY 200 tablet 2    fluticasone propionate (FLONASE) 50 mcg/actuation nasal spray       hydrALAZINE (APRESOLINE) 10 MG tablet       hydroxychloroquine (PLAQUENIL) 200 mg tablet Take 1 tablet (200 mg total) by mouth two (2) times a day. 180 tablet 3    hydrOXYzine (ATARAX) 10 MG tablet       inhalational spacing device (AEROCHAMBER MV) Spcr 1 each by Miscellaneous route two (2) times a day. With Fovent 1 each 0    lamoTRIgine (LAMICTAL) 200 MG tablet TAKE 1 TABLET BY MOUTH TWICE  DAILY 200 tablet 2    lidocaine (XYLOCAINE) 5 % ointment Apply topically four (4) times a day. 480 g 0    linaCLOtide (LINZESS) 290 mcg capsule Take 1 capsule (290 mcg total) by mouth daily. 30 capsule 5    loratadine (CLARITIN) 10 mg tablet Take 1 tablet (10 mg total) by mouth daily.      melatonin 10 mg Tab Take 1 tablet by mouth.      metFORMIN (GLUCOPHAGE-XR) 500 MG 24 hr tablet Take 1 tablet (500 mg total) by mouth daily before breakfast.      mupirocin (BACTROBAN) 2 % ointment APP EXT IEN BID  0    mycophenolate (MYFORTIC) 360 MG TbEC Take 2 tablets (720 mg total) by mouth two (2) times a day. 360 tablet 3    olopatadine (PATANOL) 0.1 % ophthalmic solution Apply 1 drop to eye daily.      omeprazole (PRILOSEC) 40 MG capsule Take 1 capsule (40 mg total) by mouth two (2) times a day. 180 capsule 3    ondansetron (ZOFRAN) 4 MG tablet       polyethylene glycol (GLYCOLAX) 17 gram/dose powder Take 17 g by mouth daily.      pregabalin (LYRICA) 75 MG capsule Take 1 capsule (75 mg total) by mouth two (2) times a day. 120 capsule 1    telmisartan (MICARDIS) 80 MG tablet Take 1 tablet (80 mg total) by mouth daily. 90 tablet 3    traZODone (DESYREL) 100 MG tablet TAKE 1 AND 1/2 TABLETS BY MOUTH  EVERY NIGHT 135 tablet 3     No current facility-administered medications on file prior to visit.     ?  ??  PHYSICAL  EXAM?  Vitals:    09/21/23 0818   BP: 141/76   BP Site: L Arm   BP Position: Sitting   BP Cuff Size: Medium   Pulse: 61   Temp: 36.3 ??C (97.3 ??F)   TempSrc: Temporal   Weight: 85.8 kg (189 lb 3.2 oz)     General:   Pleasant 67 y.o.female in no acute distress, WDWN   Cardiovascular:  Regular rate and rhythm. No murmur, rub, or gallop. No lower extremity edema.    Lungs:  Clear to auscultation.Normal respiratory effort.    Musculoskeletal:   General: Ambulates w/ cane, slow gait   Hands: No swelling or tenderness. Able to make a tight fist b/l   Wrists:FROM w/o swelling. Tender b/l   Elbows: FROM w/o swelling. Tender b/l   Shoulders: FROM w/ pain   Knees: FROM w/o effusions. Surgical scar on the R. Tender b/l.   Ankles: No swelling. Tender b/l    Psych:  Appropriate affect and mood   Skin:  Some hyperpigmentation under breasts, but no erythema or flaking, no active appearing psoriatic lesions.         Assessment/Plan:   1. Systemic lupus erythematosus with pulmonary fibrosis   Per note from pulmonology earlier this month, fibrosis appears stable. Continue f/u with pulm. SLE clinically stable today. Continue HCQ 200 mg BID, Myfortic 720 mg twice daily. Check monitoring labs below.   - Anti-DNA antibody, double-stranded  - BUN  - C3 complement  - C4 complement  - CBC w/ Differential  - Creatinine  - Protein/Creatinine Ratio, Urine  - Urinalysis with Microscopy with Culture Reflex  - Urine Culture    2. Fibromyalgia  I suspect this is the main driver of her pain today. Likely exacerbated by recent fall and poor sleep. She feels her medications are ineffective, and I recommend f/u with pain clinic to discuss this. I also discussed non-medication measures that could improve pain. Referral for aquatic PT for FMS pain reduction. Discussed sleep hygiene measures like not watching TV when she wakes up at night.   - Ambulatory referral to Physical Therapy; Future    3. Inverse psoriasis  This appears to be doing well. Recommend not using steroid cream any longer. At last visit with derm (in 2023) they recommended using topical steroids sparingly due to risk of rebound psoriasis and to use barrier creams daily. Recommend that pt start using barrier creams in affected areas daily. Can refer back to dermatology if she continues to feel psoriasis is not well controlled.     4. Primary osteoarthritis involving multiple joints  Continue voltaren gel.     5. On mycophenolate mofetil therapy  Checking monitoring labs         HCM:   - PCV13 Status: 05/26/2016  - PPSV 23 Status: 08/04/2016  - PCV20: 09/30/21  - Annual Influenza vaccine. Status: 05/18/23  -COVID-19 vaccine:05/18/23  - Bone health: Dexa 04/2022 with normal BMD.    - Plaquenil eye exam: 06/2023 with [facility] ophthalmology   - Contraception: postmenopausal        RTC 4 mo as scheduled with Dr Scarlette Calico   Greater than 40 minutes spent in visit with patient, including pre and postvisit activities.

## 2023-09-22 ENCOUNTER — Ambulatory Visit: Admit: 2023-09-22 | Discharge: 2023-09-23 | Payer: MEDICARE

## 2023-09-22 DIAGNOSIS — K219 Gastro-esophageal reflux disease without esophagitis: Secondary | ICD-10-CM | POA: Diagnosis not present

## 2023-09-22 DIAGNOSIS — L309 Dermatitis, unspecified: Secondary | ICD-10-CM | POA: Diagnosis not present

## 2023-09-22 DIAGNOSIS — G894 Chronic pain syndrome: Secondary | ICD-10-CM | POA: Diagnosis not present

## 2023-09-22 DIAGNOSIS — E1122 Type 2 diabetes mellitus with diabetic chronic kidney disease: Secondary | ICD-10-CM | POA: Diagnosis not present

## 2023-09-22 DIAGNOSIS — M797 Fibromyalgia: Secondary | ICD-10-CM | POA: Diagnosis not present

## 2023-09-22 DIAGNOSIS — J4489 Other specified chronic obstructive pulmonary disease: Secondary | ICD-10-CM | POA: Diagnosis not present

## 2023-09-22 DIAGNOSIS — M1990 Unspecified osteoarthritis, unspecified site: Secondary | ICD-10-CM | POA: Diagnosis not present

## 2023-09-22 DIAGNOSIS — I129 Hypertensive chronic kidney disease with stage 1 through stage 4 chronic kidney disease, or unspecified chronic kidney disease: Secondary | ICD-10-CM | POA: Diagnosis not present

## 2023-09-22 DIAGNOSIS — Z803 Family history of malignant neoplasm of breast: Secondary | ICD-10-CM | POA: Diagnosis not present

## 2023-09-22 DIAGNOSIS — Z1231 Encounter for screening mammogram for malignant neoplasm of breast: Secondary | ICD-10-CM | POA: Diagnosis not present

## 2023-09-22 DIAGNOSIS — Z96651 Presence of right artificial knee joint: Secondary | ICD-10-CM | POA: Diagnosis not present

## 2023-09-22 DIAGNOSIS — M329 Systemic lupus erythematosus, unspecified: Secondary | ICD-10-CM | POA: Diagnosis not present

## 2023-09-22 DIAGNOSIS — H905 Unspecified sensorineural hearing loss: Secondary | ICD-10-CM | POA: Diagnosis not present

## 2023-09-22 DIAGNOSIS — N181 Chronic kidney disease, stage 1: Secondary | ICD-10-CM | POA: Diagnosis not present
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
## 2023-09-26 LAB — ANTI-DNA ANTIBODY, DOUBLE-STRANDED: DSDNA ANTIBODY: NEGATIVE

## 2023-09-29 NOTE — Unmapped (Incomplete)
POST PROCEDURE INSTRUCTIONS   You may apply an ice pack 20-30 minutes at a time to the injection site if you experience soreness.     Keep the injection site clean and dry. You make remove the band-aid one day following the procedure.     You may take a shower but AVOID getting in to baths, pools or whirlpools for 48 HOURS AFTER THE PROCEDURE.     Remember that it takes a few days, even up to a week for the steroid medicine to reduce inflammation and pain. If you are diabetic, the steroid medicine may cause your blood sugar levels to increase. Please contact your internist regarding medication adjustments.    ACTIVITY   Refrain from heavy activity for the next 24 to 48 hours. General walking is okay. You may resume your normal activities the day following the procedure.   You may start or resume your individualized exercise program or physical therapy 48 hours after the procedure.   MEDICATIONS   Please note that it is okay to continue other prescribed medications (blood pressure, insulin, water pill, depression/anxiety pill, etc.) as well as other prescribed pain medications such as Neurontin, Lyrical, Celebrex, Ultram, Vicodin, Norco and acetaminophen (Tylenol).   SIDE EFFECTS   Increase in pain during the first 24 to 48 hours.     Trouble sleeping during the first one or two nights after the procedure-this is an effect of the steroid medicine.     Facial and or chest flushing (it feels like you have a fever, but you do not) - this is an effect of the steroid medicine.   May have a headache      You might experience:   1. Mild to moderate swelling at the joint.   2. Possible bruising at the injection site.     WHEN TO CALL THE DOCTOR/NURSE   Severe pain, worse or different that the pain you had before the procedure.     Fever or chills.     Redness, or swelling around the injection site.     Call the Pain Management  Procedural Nurses [phone] during normal business hours (7 am-3 pm). If it is AFTER HOURS or during a weekend or holiday, call the hospital operator and ask for the Anesthesia Pain physician on call at [phone].   FOR EMERGENCIES, CALL 911 OR GO TO THE NEAREST HOSPITAL EMERGENCY DEPARTMENT.   ?   TO SCHEDULE APPOINTMENTS OR FOR QUESTIONS RELATED TO MEDICATIONS   Call the Pain Management Clinic at [phone]

## 2023-09-30 DIAGNOSIS — M170 Bilateral primary osteoarthritis of knee: Secondary | ICD-10-CM | POA: Diagnosis not present

## 2023-09-30 DIAGNOSIS — G629 Polyneuropathy, unspecified: Secondary | ICD-10-CM | POA: Diagnosis not present

## 2023-09-30 DIAGNOSIS — M19019 Primary osteoarthritis, unspecified shoulder: Secondary | ICD-10-CM | POA: Diagnosis not present
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
## 2023-10-02 ENCOUNTER — Ambulatory Visit: Admit: 2023-10-02 | Discharge: 2023-10-02 | Payer: MEDICARE

## 2023-10-02 ENCOUNTER — Ambulatory Visit: Admit: 2023-10-02 | Discharge: 2023-10-02 | Payer: MEDICARE | Attending: Psychologist | Primary: Psychologist

## 2023-10-02 DIAGNOSIS — M797 Fibromyalgia: Secondary | ICD-10-CM | POA: Diagnosis not present

## 2023-10-02 DIAGNOSIS — M47816 Spondylosis without myelopathy or radiculopathy, lumbar region: Secondary | ICD-10-CM | POA: Diagnosis not present

## 2023-10-02 DIAGNOSIS — M48061 Spinal stenosis, lumbar region without neurogenic claudication: Secondary | ICD-10-CM | POA: Diagnosis not present

## 2023-10-02 MED ADMIN — lidocaine (XYLOCAINE) 5 mg/mL (0.5 %) injection 50 mL: 50 mL | @ 17:00:00 | Stop: 2023-10-02

## 2023-10-02 MED ADMIN — triamcinolone acetonide (KENALOG-40) injection 40 mg: 40 mg | @ 17:00:00 | Stop: 2023-10-02

## 2023-10-02 MED ADMIN — lidocaine (XYLOCAINE) 20 mg/mL (2 %) injection 10 mL: 10 mL | @ 17:00:00 | Stop: 2023-10-02

## 2023-10-02 NOTE — Unmapped (Signed)
Message sent to scheduling team to schedule patient per Dr. Fabian November.

## 2023-10-02 NOTE — Unmapped (Signed)
Driver present. AVS printed and reviewed with patient. Patient voiced understanding.

## 2023-10-02 NOTE — Unmapped (Signed)
POST PROCEDURE INSTRUCTIONS   You may apply an ice pack 20-30 minutes at a time to the injection site if you experience soreness.     Keep the injection site clean and dry. You make remove the band-aid one day following the procedure.     You may take a shower but AVOID getting in to baths, pools or whirlpools for 48 HOURS AFTER THE PROCEDURE.     Remember that it takes a few days, even up to a week for the steroid medicine to reduce inflammation and pain. If you are diabetic, the steroid medicine may cause your blood sugar levels to increase. Please contact your internist regarding medication adjustments.    ACTIVITY   Refrain from heavy activity for the next 24 to 48 hours. General walking is okay. You may resume your normal activities the day following the procedure.   You may start or resume your individualized exercise program or physical therapy 48 hours after the procedure.   MEDICATIONS   Please note that it is okay to continue other prescribed medications (blood pressure, insulin, water pill, depression/anxiety pill, etc.) as well as other prescribed pain medications such as Neurontin, Lyrical, Celebrex, Ultram, Vicodin, Norco and acetaminophen (Tylenol).   SIDE EFFECTS   Increase in pain during the first 24 to 48 hours.     Trouble sleeping during the first one or two nights after the procedure-this is an effect of the steroid medicine.     Facial and or chest flushing (it feels like you have a fever, but you do not) - this is an effect of the steroid medicine.   May have a headache      You might experience:   1. Mild to moderate swelling at the joint.   2. Possible bruising at the injection site.     WHEN TO CALL THE DOCTOR/NURSE   Severe pain, worse or different that the pain you had before the procedure.     Fever or chills.     Redness, or swelling around the injection site.     Call the Pain Management  Procedural Nurses [phone] during normal business hours (7 am-3 pm). If it is AFTER HOURS or during a weekend or holiday, call the hospital operator and ask for the Anesthesia Pain physician on call at [phone].   FOR EMERGENCIES, CALL 911 OR GO TO THE NEAREST HOSPITAL EMERGENCY DEPARTMENT.   ?   TO SCHEDULE APPOINTMENTS OR FOR QUESTIONS RELATED TO MEDICATIONS   Call the Pain Management Clinic at [phone]

## 2023-10-02 NOTE — Unmapped (Signed)
PROCEDURE; Bilateral L3/L4/L5 Medial Branch Nerve Blocks #1 with steroid    PRE-PROCEDURE DIAGNOSIS: Lumbar Spondylosis without myelopathy or radiculopathy.  M47.816  POST-PROCEDURE DIAGNOSIS: Same    PERFORMED BY: Dr. Clarene Essex  ASSISTANT: Dr. Cloyde Reams  Anesthesia: Local    BRIEF HISTORY:    Caitlin Smith is a 68 y.o.  being followed at [facility] for complaint of chronic pain localized to right knee and bilateral neck, shoulders and upper back myofascial pain and fibromyalgia, and lower back 2/2 lumbar facet arthropathy.    The patient does have pain with palpation over the facet joints and has positive facet loading on exam.  Pre-procedure motor function  5/5 in lower extremities    DESCRIPTION OF THE PROCEDURE:   Informed consent was obtained and potential risks discussed including, but not limited to: bleeding, bruising, severe allergic reaction to components of the injection materials, infection, nerve damage, the possibility of no benefit (pain relief) derived from the injection, or in rare occasions worsening of pain. Questions were answered to the patient's satisfaction and the patient wishes to proceed.  Alternative options for treatment have previously been discussed and explored with the patient. The patient is not taking antiplatelet or anticoagulation medications and does  have a driver today.    The patient was taken to the fluoroscopy suite and placed in a prone position. A timeout was performed and the correct location was confirmed with the patient. The patient's heart rate, blood pressure, oxygen saturations were continuously monitored throughout the procedure. The lumbar area was sterilely prepped with Chloraprep and sterilely draped. The physician wore a hat, mask and sterile gloves.    In an AP view, after topicalizing the skin and subcutaneous tissues with 0.5% lidocaine, a 22-gauge 3.5 inch spinal needle was advanced in a coaxial manner to contact the junction of the S1 superior articulating process and the sacral ala for the L5 medial branch block.  After negative aspiration for blood, 0.30mL of a mixture of 2mL of 2% lidocaine and 1 mL Kenalog (40 mg/mL)  was injected without difficulty.  The needle was removed intact. Under oblique fluoroscopic guidance, the junctions of the superior articular process and the transverse process of L4 and L5 were identified on the right.  The target area for the L4 medial branch is at the junction of the L5 transverse process and the L5 superior articulating process.  The target area for the L3 medial branch is at the junction of the L4 transverse process and the L4 superior articulating process.  The skin and the subcutaneous tissues were topicalized with small amount of 0.5% lidocaine. A 3.5 inch 22-gauge Quincke needle was inserted until contact was made on each pedicle os under fluoroscopic guidance.  After negative aspiration for blood, 0.71mL of a mixture of 2mL of 2% lidocaine and 1 mL Kenalog (40 mg/mL)  was injected at each level without difficulty.  The needles were removed intact.       The procedure was performed in identical fashion on the contralateral side.    The patient did tolerate the procedure well and there were no apparent complications. All injection sites were sterilely dressed. The patient was discharged after an appropriate period of observation and post procedural education was given.  A pain diary to record pain scores for the rest of the day was provided to the patient.    Total fluoroscopic time  15  seconds.  Pre-procedure pain score was 7/10  Post-procedure pain score  is 7/10  Total steroid: 40 mg Kenalog.    Post-procedure motor function  5/5 in lower  extremities    DISPOSITION:   Follow up with results to schedule MBB#2.    Requested Prescriptions      No prescriptions requested or ordered in this encounter     No orders of the defined types were placed in this encounter.

## 2023-10-03 ENCOUNTER — Encounter: Admit: 2023-10-03 | Discharge: 2023-10-04 | Payer: MEDICARE | Attending: Psychologist | Primary: Psychologist

## 2023-10-03 DIAGNOSIS — F331 Major depressive disorder, recurrent, moderate: Principal | ICD-10-CM

## 2023-10-03 DIAGNOSIS — F410 Panic disorder [episodic paroxysmal anxiety] without agoraphobia: Principal | ICD-10-CM

## 2023-10-03 DIAGNOSIS — F411 Generalized anxiety disorder: Principal | ICD-10-CM

## 2023-10-03 DIAGNOSIS — F4310 Post-traumatic stress disorder, unspecified: Principal | ICD-10-CM
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
## 2023-10-03 NOTE — Unmapped (Signed)
Called patient to discuss the post procedure results:     Procedure date:  10/02/2023     Procedure : Bilateral L3/L4/L5 Medial Branch Nerve Blocks #1 with steroid      Percentage of pain relief following the procedure :  80%     How long did the relief last :  24 hours and ongoing.     Patient advised to reach back out to the clinic if/when her pain goes down to 50-60%.

## 2023-10-06 NOTE — Unmapped (Signed)
 I was present for the entirety of the procedure(s).

## 2023-10-09 ENCOUNTER — Ambulatory Visit: Payer: Self-pay | Admitting: *Deleted

## 2023-10-09 NOTE — Telephone Encounter (Signed)
  Chief Complaint: frequent falls Symptoms: dizziness, off balance- causing patient to have frequent falls. Patient states she is afraid to walk because she doesn't want to fall.  Frequency: ongoing- last fall- last week in office parking lot during her walk Pertinent Negatives: Patient denies injury- although she states she hit her head on the tub Disposition: [] ED /[] Urgent Care (no appt availability in office) / [x] Appointment(In office/virtual)/ []  [facility]/ [] Home Care/ [] Refused Recommended Disposition /[] [facility]/ []  Follow-up with PCP Additional Notes: Patient states has been falling more frequent- not sure why. Patient has scheduled an appointment- to discuss trying to figure out why she is falling so much.

## 2023-10-09 NOTE — Telephone Encounter (Signed)
Summary: Dizziness advice   Pt is calling to report that she has been feeling dizzy for a couple of months. And reporting that it has gotten worse. Please advise     Reason for Disposition  [1] Fall AND [2] patient seems very anxious and fearful of falling again  Answer Assessment - Initial Assessment Questions 1. MECHANISM: "How did the fall happen?"     Frequent falls- last week 2. DOMESTIC VIOLENCE AND ELDER ABUSE SCREENING: "Did you fall because someone pushed you or tried to hurt you?" If Yes, ask: "Are you safe now?"     no 3. ONSET: "When did the fall happen?" (e.g., minutes, hours, or days ago)     Last week- patient feels dizzy, has neuropathy, off balance- pulls to one side 4. LOCATION: "What part of the body hit the ground?" (e.g., back, buttocks, head, hips, knees, hands, head, stomach)     No injury- patient was standing beside a tree 5. INJURY: "Did you hurt (injure) yourself when you fell?" If Yes, ask: "What did you injure? Tell me more about this?" (e.g., body area; type of injury; pain severity)"     Patient has hit head before 6. PAIN: "Is there any pain?" If Yes, ask: "How bad is the pain?" (e.g., Scale 1-10; or mild,  moderate, severe)   - NONE (0): No pain   - MILD (1-3): Doesn't interfere with normal activities    - MODERATE (4-7): Interferes with normal activities or awakens from sleep    - SEVERE (8-10): Excruciating pain, unable to do any normal activities      no 7. SIZE: For cuts, bruises, or swelling, ask: "How large is it?" (e.g., inches or centimeters)      no  9. OTHER SYMPTOMS: "Do you have any other symptoms?" (e.g., dizziness, fever, weakness; new onset or worsening).      Dizziness-off balance 10. CAUSE: "What do you think caused the fall (or falling)?" (e.g., tripped, dizzy spell)       Unsure- dizzy spell  Protocols used: Falls and [facility]

## 2023-10-09 NOTE — Unmapped (Signed)
Otolaryngology Return Voice Visit        History of Present Illness:  CaitlinCaitlin Smith is a 68 y.o.  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      At the end of our previous clinic visit we discussed and recommended clinical course of Respiratory Retraining, Biotene mouthwash, D/C Listerine- switch to salt water gargles      July 2017: I recommended NeilMed rinses and a workup of glossitis (electrolyte and vitamin levels per primary care physician: B12, zinc, etc.,) Her diagnoses included right vocal fold hypomobility/depression anxiety. 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     December 2018: The patient notes that her symptoms have been relatively stable.  She continues to complain of burning tongue.  Following this visit I recommended a thyroid hormone panel and thyroid ultrasound.     August 2019: The patient reports that she continues to clear there throat and cough in a consistent manner.  Thyroid ultrasound was unremarkable w/o nodules. She occasionally (rarely ) awakens from sleep with cough. She has done a few sessions with SLP, but claims to have forgotten the techniques.  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  I additionally offered behavioral intervention for her cough symptom.     April 2021: In the interim the patient is followed up with neurosurgery and has had radiography demonstrating no additional lesions.  The patient did however complain of right-sided head throbbing, and otalgia.  Caitlin Smith notes that she has continued to have balance difficulty, intermittent cough and dysphonia.  Lastly, she reports that her throat has been burning more than in the past though she has not seen her GI physician for some time.  The patient reports that she has had ongoing right otalgia that she is treated with hydrogen peroxide.  She has had her ears flushed by her primary care physician without resolution.  The patient was referred to otology for right otalgia.     June 2022: Patient returns with concerns for weak vocal projection, worsened over the past several weeks. She attributes this to her LPR, but has been taking her daily Prilosec with minimal relief. Otherwise, she has been doing well, without any additional concerns. Given the stability of the patient's physical examination and findings referable to upper airway inflammation, I have a recommended that we proceed with management of her known LPR. Add Famotidine 20 mg BID to supplement Prilosec 40 mg     October 2022: The patient returns for follow-up. She states that her voice continues to be hoarse. The patient endorses seasonal allergies. She states that she follows up with pulmonology and was started on albuterol inhaler. She endorses coughing symptoms that has been a past ongoing issue. The patient states that she has the sensation of food getting caught in her epigastric region. She endorses abdominal pain, right under the ribcage, for the past 4-5 months and has not seen GI. She describes it as a dull pain that will last for a few minutes before going away. She states that the abdominal pain will occur randomly at rest and has generally not been benefited by reflux medication. She endorses difficulty with bowel movement, which has been an ongoing issue in the past.I reassured the patient that her larynx appears healthy on laryngoscopy today. I recommend NeilMed nasal saline irrigation  for general nasopharyngeal hygiene. I will refer the patient to GI for further evaluation of gastric discomfort and constipation.  I offered a referral to GI for gastric discomfort and constipation, and recommended NeilMed nasal irrigations.     October 2023: The patient coughs consistently  with pain in the right ear.  The cough comes throughout the day without clear inciting behaviors.  She needs water to calm it down.  The patient reports having tried reflux medication, particularly due to the fact that she also reports some burning in the throat, however this was unhelpful for her cough symptom.  The patient also reports achiness in the right ear (the same side as her previous skull base retroauricular approach). We discussed interventions for chronic cough (putative neurogenic cough).  These include neuromodulator medicines (patient currently on gabapentin and less interested in beginning another chronic medication), as well as interventions such as SLN injection, and intra laryngeal Botox.  We have decided to proceed to trial of SLN injection on the right, given the prevalence of her symptoms on the right (ear, vocal fold hypomobility)     December 2023: Over the interval since her last visit the patient reports that her cough has been persistent slightly of less intensity recently.  She also notes that she has had some unintended weight loss, and has a specific concern that she may have esophageal cancer, having heard and anecdote about another patient (albeit with no specific current upper GI symptoms).  She request screening for this.     August 17, 2022: The patient underwent right superior laryngeal nerve injection with steroid/local anesthetic.     January 2024: We reviewed the barium swallow results with the patient today.  There was no evidence of significant strictures or mucosal abnormalities.  There was notable stasis of the barium column within the esophagus after the swallow, however.  The patient reports that she has had approximately an 80% improvement in her frequency and severity of cough following her right superior laryngeal nerve injection she is quite pleased with this.  We discussed the role for further workup of esophageal dysmotility through GI and some of the likely tests to be performed.  The patient has opted to forego further workup of this complaint at this point.We discussed the role for further workup of esophageal dysmotility through GI and some of the likely tests to be performed.  The patient has opted to forego further workup of this complaint at this point.    February 2025: The patient reports that in the last year she has lost a significant amount of weight (up to 40 pounds).  She reports that in the last 1 to 2 months her voice has been fluctuating without any particular antecedent event.  She has difficulty with intelligibility on the phone, and this unable to sing.  She denies any interval significant illnesses or procedures.      Interval Surgery/Medical History:    Interval  Past Medical History and ROS is otherwise unchanged except as per HPI      Medications:    Current Outpatient Medications:     albuterol HFA 90 mcg/actuation inhaler, Inhale 2 puffs every six (6) hours as needed for wheezing., Disp: 8 g, Rfl: 11    amlodipine (NORVASC) 2.5 MG tablet, Take 3 tablets (7.5 mg total) by mouth daily., Disp: 90 tablet, Rfl: 3    aspirin (ECOTRIN) 81 MG tablet, Take 1 tablet (81 mg total) by mouth daily., Disp: , Rfl:  atenolol (TENORMIN) 25 MG tablet, Take 1.5 tablets (37.5 mg total) by mouth two (2) times a day., Disp: 300 tablet, Rfl: 3    atorvastatin (LIPITOR) 40 MG tablet, Take 1 tablet (40 mg total) by mouth daily., Disp: , Rfl:     biotin 1 mg cap, Take by mouth., Disp: , Rfl:     buPROPion (WELLBUTRIN XL) 300 MG 24 hr tablet, TAKE 1 TABLET BY MOUTH EVERY DAY, Disp: 90 tablet, Rfl: 3    carboxymethylcellulose sodium (ARTIFICIAL TEARS, CMC,) 1 % Drop, Apply to eye., Disp: , Rfl:     clonazePAM (KLONOPIN) 0.5 MG tablet, Take 0.5 tablets (0.25 mg total) by mouth Three (3) times a day as needed for anxiety., Disp: 30 tablet, Rfl: 1    clotrimazole-betamethasone (LOTRISONE) 1-0.05 % cream, Apply 1 Application topically two (2) times a day., Disp: , Rfl:     cyclobenzaprine (FLEXERIL) 5 MG tablet, Take 1 tablet (5 mg total) by mouth two (2) times a day as needed for muscle spasms., Disp: 120 tablet, Rfl: 1    diclofenac sodium (VOLTAREN) 1 % gel, Apply 2 g topically two (2) times a day as needed for arthritis., Disp: 300 g, Rfl: 5    dicyclomine (BENTYL) 20 mg tablet, Take 1 tablet (20 mg total) by mouth daily as needed., Disp: , Rfl:     famotidine (PEPCID) 20 MG tablet, TAKE 1 TABLET BY MOUTH TWICE  DAILY, Disp: 200 tablet, Rfl: 2    fluticasone propionate (FLONASE) 50 mcg/actuation nasal spray, , Disp: , Rfl:     hydrALAZINE (APRESOLINE) 10 MG tablet, , Disp: , Rfl:     hydroxychloroquine (PLAQUENIL) 200 mg tablet, Take 1 tablet (200 mg total) by mouth two (2) times a day., Disp: 180 tablet, Rfl: 3    hydrOXYzine (ATARAX) 10 MG tablet, , Disp: , Rfl:     inhalational spacing device (AEROCHAMBER MV) Spcr, 1 each by Miscellaneous route two (2) times a day. With [employer], Disp: 1 each, Rfl: 0    lamoTRIgine (LAMICTAL) 200 MG tablet, TAKE 1 TABLET BY MOUTH TWICE  DAILY, Disp: 200 tablet, Rfl: 2    lidocaine (XYLOCAINE) 5 % ointment, Apply topically four (4) times a day., Disp: 480 g, Rfl: 0    linaCLOtide (LINZESS) 290 mcg capsule, Take 1 capsule (290 mcg total) by mouth daily., Disp: 30 capsule, Rfl: 5    loratadine (CLARITIN) 10 mg tablet, Take 1 tablet (10 mg total) by mouth daily., Disp: , Rfl:     melatonin 10 mg Tab, Take 1 tablet by mouth., Disp: , Rfl:     metFORMIN (GLUCOPHAGE-XR) 500 MG 24 hr tablet, Take 1 tablet (500 mg total) by mouth daily before breakfast., Disp: , Rfl:     mupirocin (BACTROBAN) 2 % ointment, APP EXT IEN BID, Disp: , Rfl: 0    mycophenolate (MYFORTIC) 360 MG TbEC, Take 2 tablets (720 mg total) by mouth two (2) times a day., Disp: 360 tablet, Rfl: 3    olopatadine (PATANOL) 0.1 % ophthalmic solution, Apply 1 drop to eye daily., Disp: , Rfl:     omeprazole (PRILOSEC) 40 MG capsule, Take 1 capsule (40 mg total) by mouth two (2) times a day., Disp: 180 capsule, Rfl: 3    ondansetron (ZOFRAN) 4 MG tablet, , Disp: , Rfl:     polyethylene glycol (GLYCOLAX) 17 gram/dose powder, Take 17 g by mouth daily., Disp: , Rfl:     pregabalin (LYRICA) 75 MG capsule, Take 1 capsule (  75 mg total) by mouth two (2) times a day., Disp: 120 capsule, Rfl: 1    telmisartan (MICARDIS) 80 MG tablet, Take 1 tablet (80 mg total) by mouth daily., Disp: 90 tablet, Rfl: 3    traZODone (DESYREL) 100 MG tablet, TAKE 1 AND 1/2 TABLETS BY MOUTH  EVERY NIGHT, Disp: 135 tablet, Rfl: 3         Pertinent ROS as above and per HPI.     Physical Exam:   Constitutional:  Vitals reviewed on nursing chart.  Voice: Fluctuating/with multiple pitch breaks  Respiration:  Breathing comfortably, no stridor.   Ears:  Normal tympanic membranes to otoscopy,   Nose:  External nose midline, anterior rhinoscopy is normal with limited visualization just to the anterior interior turbinate.   Oral Cavity/Oropharynx/Lips:  Normal mucous membranes, normal floor of mouth/tongue/oropharynx, no masses or lesions are noted.    Pharyngeal Walls:  No masses noted.  Neck/Lymph:  No lymphadenopathy, no thyroid masses.        Procedure: 10/10/2023     Endoscopy Type:  Flexible Fiberoptic Videostroboscopy    Indications/TimeOut:  To better evaluate the patient???s symptoms, fiberoptic videostroboscopy is indicated.   A time out identifying the patient, the procedure, the location of the procedure and any concerns was performed prior to beginning the procedure.    Procedure Details:    The patient was placed in the sitting position.  After topical anesthesia and decongestion with oxymetazoline and lidocaine, the flexible laryngoscope was passed.  The microphone was used to trigger the xenon stroboscopic light source.      -  Hypopharynx - There were no lesions in the pyriformis, epiglottis, or base of tongue.  -  Larynx -  there was mild interarytenoid edema, no erythema.  -  Vocal Folds:                 Supraglottis: Normal     Infraglottis/Subglottis:  Normal     Mobility: Right VF hypomobility     Amplitude: Symmetric bilaterally     Mucosal Wave: Mucosal wave intact bilaterally and Asymmetric phase     Closure: Complete     Lesions: None    Condition:  Stable.  Patient tolerated procedure well.    Complications: None    Voice- Related Quality of Life (VR-QOL) Measure:  I have trouble speaking loudly or being heard in noisy situations: 5  I run out of air and need to take frequent breaths when talking: 5  I sometimes do not know what will come out when I begin speaking: 5  I am sometimes anxious or frustrated because of my voice: 1  I sometimes get depressed because of my voice: 3  I have trouble using the telephone because of my voice: 5  I have trouble doing my job or practicing my profession because of my voice: 1  I avoid going out socially because of my voice: 1  I have to repeat myself to be understood: 3  I have become less outgoing because of my voice: 1  VRQOL Raw Score: 30  Calculated Score: 50  Glottal Function Index:  Speaking took extra effort: 2  Throat discomfort or pain after using your voice: 1  Vocal fatigue (voice weakened as you talked): 3  Voice cracks or sounds different: 4  Glottal Function Index Total: 10      Assessment:   68 y.o.  female patient with a history of right-sided microvascular decompression of the facial nerve in  2000 that resulted in right ear deafness.      At the end of our previous clinic visit we discussed and recommended clinical course of Respiratory Retraining, Biotene mouthwash, D/C Listerine- switch to salt water gargles      July 2017: I recommended NeilMed rinses and a workup of glossitis (electrolyte and vitamin levels per primary care physician: B12, zinc, etc.,) Her diagnoses included right vocal fold hypomobility/depression anxiety. 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     December 2018: The patient notes that her symptoms have been relatively stable.  She continues to complain of burning tongue.  Following this visit I recommended a thyroid hormone panel and thyroid ultrasound.     August 2019: The patient reports that she continues to clear there throat and cough in a consistent manner.  Thyroid ultrasound was unremarkable w/o nodules. She occasionally (rarely ) awakens from sleep with cough. She has done a few sessions with SLP, but claims to have forgotten the techniques.  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  I additionally offered behavioral intervention for her cough symptom.     April 2021: In the interim the patient is followed up with neurosurgery and has had radiography demonstrating no additional lesions.  The patient did however complain of right-sided head throbbing, and otalgia.  Ms. Phung notes that she has continued to have balance difficulty, intermittent cough and dysphonia.  Lastly, she reports that her throat has been burning more than in the past though she has not seen her GI physician for some time.  The patient reports that she has had ongoing right otalgia that she is treated with hydrogen peroxide.  She has had her ears flushed by her primary care physician without resolution.  The patient was referred to otology for right otalgia.     June 2022: Patient returns with concerns for weak vocal projection, worsened over the past several weeks. She attributes this to her LPR, but has been taking her daily Prilosec with minimal relief. Otherwise, she has been doing well, without any additional concerns. Given the stability of the patient's physical examination and findings referable to upper airway inflammation, I have a recommended that we proceed with management of her known LPR. Add Famotidine 20 mg BID to supplement Prilosec 40 mg     October 2022: The patient returns for follow-up. She states that her voice continues to be hoarse. The patient endorses seasonal allergies. She states that she follows up with pulmonology and was started on albuterol inhaler. She endorses coughing symptoms that has been a past ongoing issue. The patient states that she has the sensation of food getting caught in her epigastric region. She endorses abdominal pain, right under the ribcage, for the past 4-5 months and has not seen GI. She describes it as a dull pain that will last for a few minutes before going away. She states that the abdominal pain will occur randomly at rest and has generally not been benefited by reflux medication. She endorses difficulty with bowel movement, which has been an ongoing issue in the past.I reassured the patient that her larynx appears healthy on laryngoscopy today. I recommend NeilMed nasal saline irrigation for general nasopharyngeal hygiene. I will refer the patient to GI for further evaluation of gastric discomfort and constipation.  I offered a referral to GI for gastric discomfort and constipation, and recommended NeilMed nasal irrigations.  October 2023: The patient coughs consistently  with pain in the right ear.  The cough comes throughout the day without clear inciting behaviors.  She needs water to calm it down.  The patient reports having tried reflux medication, particularly due to the fact that she also reports some burning in the throat, however this was unhelpful for her cough symptom.  The patient also reports achiness in the right ear (the same side as her previous skull base retroauricular approach). We discussed interventions for chronic cough (putative neurogenic cough).  These include neuromodulator medicines (patient currently on gabapentin and less interested in beginning another chronic medication), as well as interventions such as SLN injection, and intra laryngeal Botox.  We have decided to proceed to trial of SLN injection on the right, given the prevalence of her symptoms on the right (ear, vocal fold hypomobility)     December 2023: Over the interval since her last visit the patient reports that her cough has been persistent slightly of less intensity recently.  She also notes that she has had some unintended weight loss, and has a specific concern that she may have esophageal cancer, having heard and anecdote about another patient (albeit with no specific current upper GI symptoms).  She request screening for this.     August 17, 2022: The patient underwent right superior laryngeal nerve injection with steroid/local anesthetic.     January 2024: We reviewed the barium swallow results with the patient today.  There was no evidence of significant strictures or mucosal abnormalities.  There was notable stasis of the barium column within the esophagus after the swallow, however.  The patient reports that she has had approximately an 80% improvement in her frequency and severity of cough following her right superior laryngeal nerve injection she is quite pleased with this.  We discussed the role for further workup of esophageal dysmotility through GI and some of the likely tests to be performed.  The patient has opted to forego further workup of this complaint at this point.We discussed the role for further workup of esophageal dysmotility through GI and some of the likely tests to be performed.  The patient has opted to forego further workup of this complaint at this point.    February 2025: The patient reports that in the last year she has lost a significant amount of weight (up to 40 pounds).  She reports that in the last 1 to 2 months her voice has been fluctuating without any particular antecedent event.  She has difficulty with intelligibility on the phone, and this unable to sing.  She denies any interval significant illnesses or procedures.    1. LPR  2. Slightly enlarged right Thyroid gland/ no dominant nodule  3. Burning mouth syndrome    4.  Chronic cough-significantly improved following right SLN injection (August 17, 2022)   5. MTD  6. Epigastric pain   7. Evidence of esouphageal dysmotility  8.  Likely new functional voice component      Plan:  1.  Behavioral voice therapeutic intervention if indicated    2.  Follow-up in 6 months    The patient in agreement the plan as articulated above.          This note was created with [employer] and may have errors that were not dictated and not seen in editing.     Elana Alm  Brae Gartman

## 2023-10-10 ENCOUNTER — Encounter: Payer: Self-pay | Admitting: Family Medicine

## 2023-10-10 ENCOUNTER — Ambulatory Visit: Payer: 59 | Admitting: Family Medicine

## 2023-10-10 ENCOUNTER — Ambulatory Visit: Admit: 2023-10-10 | Discharge: 2023-10-11 | Payer: MEDICARE | Attending: Otolaryngology | Primary: Otolaryngology

## 2023-10-10 ENCOUNTER — Ambulatory Visit: Admit: 2023-10-10 | Payer: MEDICARE

## 2023-10-10 VITALS — BP 146/82 | HR 63 | Resp 16 | Ht 67.0 in | Wt 181.7 lb

## 2023-10-10 DIAGNOSIS — R1310 Dysphagia, unspecified: Secondary | ICD-10-CM | POA: Diagnosis not present

## 2023-10-10 DIAGNOSIS — Z0289 Encounter for other administrative examinations: Secondary | ICD-10-CM

## 2023-10-10 DIAGNOSIS — R490 Dysphonia: Secondary | ICD-10-CM | POA: Diagnosis not present

## 2023-10-10 DIAGNOSIS — E441 Mild protein-calorie malnutrition: Secondary | ICD-10-CM

## 2023-10-10 DIAGNOSIS — R7303 Prediabetes: Secondary | ICD-10-CM | POA: Diagnosis not present

## 2023-10-10 DIAGNOSIS — R634 Abnormal weight loss: Secondary | ICD-10-CM | POA: Diagnosis not present

## 2023-10-10 DIAGNOSIS — R296 Repeated falls: Secondary | ICD-10-CM | POA: Diagnosis not present

## 2023-10-10 DIAGNOSIS — J3801 Paralysis of vocal cords and larynx, unilateral: Secondary | ICD-10-CM | POA: Diagnosis not present

## 2023-10-10 NOTE — Patient Instructions (Signed)
Team Member Role and Visual merchandiser Info Address Start End Comments  Rickard Patience, MD Consulting Physician (Oncology) Phone: [phone] Fax: [phone] [address] [place] [address] 10/10/2023 - -

## 2023-10-10 NOTE — Progress Notes (Signed)
 Name: Nicole Ballard   MRN: [id number]    DOB: 11-19-1955   Date:10/10/2023       Progress Note  Subjective  Chief Complaint  Chief Complaint  Patient presents with   Fall    Multiple falls w/o being unconscious. Pt states feels off balance right before she falls and takes her a few to know where she Is at.    HPI   Patient came in today due to recurrent falls. She has a history of empty sella but last MRI brain in 2023 was unremarkable, she was seen by ENT yesterday but states vertigo under control. She was seen by neurologist for falls and advised to see PT, she is still having weekly PT visits and was advised to use a walker but she states does not want to use walker or a cane because she wants to exercise. She has a history of losing weight for getting hyper focused on her diet and also when stressed. Advised her to discuss disorderly eating with psychiatrist and consider stopping metformin  . Also advised to make a diary to let us  know what symptoms she has before falling. She denies loss of consciousness just feels weak. Discussed hypoglycemic episodes . She was seen by Dr. Babara for unintentional weight loss in June 2024 and advised to go back for follow up. She is due for a pap smear, other cancer screenings up to date   She came in with her son today, answered all questions. Discussed a smart wash since she states cannot afford monthly cost of emergency alert  Patient Active Problem List   Diagnosis Date Noted   Lumbar facet arthropathy 10/02/2023   Genetic testing 03/14/2023   Abnormal SPEP 01/13/2023   Weight loss 01/13/2023   Family history of breast cancer 01/13/2023   Asthma, chronic, severe persistent, uncomplicated 10/12/2022   Other vascular myelopathies (HCC) 10/12/2022   Interstitial lung disease (HCC) 07/12/2022   Empty sella (HCC) 07/12/2022   History of craniotomy 07/12/2022   Immunocompromised (HCC) 04/29/2021   Hyperglycemia 10/23/2017   Vertigo 09/29/2016    Bronchiolitis obliterans organizing pneumonia (HCC) 09/29/2016   Neck pain, musculoskeletal 12/15/2015   Cough in adult 09/11/2015   Abnormal CAT scan 04/11/2015   Auditory impairment 04/11/2015   Insomnia, persistent 04/11/2015   Chronic kidney disease (CKD), stage I 04/11/2015   Chronic nonmalignant pain 04/11/2015   Chronic constipation 04/11/2015   Dyslipidemia 04/11/2015   Fibromyalgia 04/11/2015   Gastro-esophageal reflux disease without esophagitis 04/11/2015   Dysphonia 04/11/2015   Low back pain 04/11/2015   Eczema intertrigo 04/11/2015   Keratosis pilaris 04/11/2015   Chronic recurrent major depressive disorder (HCC) 04/11/2015   Dysmetabolic syndrome 04/11/2015   Neurogenic claudication 04/11/2015   Perennial allergic rhinitis with seasonal variation 04/11/2015   Abnormal presence of protein in urine 04/11/2015   Seborrhea capitis 04/11/2015   Moderate dysplasia of vulva 04/11/2015   Vitamin D  deficiency 04/11/2015   Spinal stenosis of lumbar region 04/11/2015   Treadmill stress test negative for angina pectoris 03/05/2015   Shortness of breath 05/20/2014   Asthma, chronic 12/31/2013   H/O total knee replacement 12/31/2013   Episcleritis 09/26/2013   Vocal cord dysfunction 05/28/2013   Anxiety, generalized 03/19/2013   Back pain 12/18/2012   Pulmonary nodules 11/26/2012   Lung nodule, multiple 11/26/2012   Arthritis, degenerative 11/01/2012   H/O aspiration pneumonitis 10/22/2012   Postinflammatory pulmonary fibrosis (HCC) 10/22/2012   Mixed incontinence 05/04/2012   Lung involvement in systemic lupus  erythematosus (HCC) 11/05/2010   Chronic nausea 07/01/2008   Benign hypertension 01/25/2005    Past Surgical History:  Procedure Laterality Date   BRAIN SURGERY     CHOLECYSTECTOMY     VAGINAL HYSTERECTOMY     vulvar surgery      Family History  Problem Relation Age of Onset   Diabetes Mother    Hypertension Mother    Breast cancer Mother    Stomach  cancer Father        d.>50   Breast cancer Maternal Aunt        dx >50   Breast cancer Maternal Aunt        dx 36s   Breast cancer Maternal Aunt    Cancer Maternal Uncle        unk type   Cancer Maternal Uncle        unk type   Breast cancer Cousin        dx 15s   Breast cancer Cousin        dx 30s, does not want genetic testing    Social History   Tobacco Use   Smoking status: Never   Smokeless tobacco: Never  Substance Use Topics   Alcohol use: No    Alcohol/week: 0.0 standard drinks of alcohol     Current Outpatient Medications:    albuterol  (VENTOLIN  HFA) 108 (90 Base) MCG/ACT inhaler, SMARTSIG:2 Puff(s) By Mouth Every 6 Hours PRN, Disp: , Rfl:    amLODipine  (NORVASC ) 2.5 MG tablet, TAKE 3 TABLETS BY MOUTH DAILY, Disp: 270 tablet, Rfl: 0   aspirin  EC 81 MG tablet, Take 1 tablet (81 mg total) by mouth daily., Disp: 30 tablet, Rfl: 0   atenolol  (TENORMIN ) 25 MG tablet, Take 1 tablet by mouth in the morning and at bedtime., Disp: , Rfl:    atorvastatin  (LIPITOR) 40 MG tablet, TAKE 1 TABLET BY MOUTH DAILY, Disp: 100 tablet, Rfl: 2   Biotin  1 MG CAPS, Take 1 capsule by mouth daily., Disp: 30 capsule, Rfl: 0   buPROPion  (WELLBUTRIN  XL) 300 MG 24 hr tablet, Take 300 mg by mouth in the morning., Disp: , Rfl:    Cholecalciferol (VITAMIN D ) 2000 UNITS CAPS, Take 1 capsule by mouth daily., Disp: , Rfl:    clobetasol ointment (TEMOVATE) 0.05 %, Apply 1 application topically 2 (two) times daily., Disp: , Rfl:    clonazePAM  (KLONOPIN ) 0.5 MG tablet, Take 0.25 mg by mouth at bedtime., Disp: , Rfl:    clotrimazole -betamethasone  (LOTRISONE ) cream, Apply 1 application topically 2 (two) times daily., Disp: 45 g, Rfl: 0   cyclobenzaprine  (FLEXERIL ) 5 MG tablet, Take by mouth., Disp: , Rfl:    diclofenac  Sodium (VOLTAREN ) 1 % GEL, Apply topically daily as needed., Disp: , Rfl:    fluticasone  (FLONASE ) 50 MCG/ACT nasal spray, Place 2 sprays into both nostrils daily., Disp: 48 g, Rfl: 3    hydrALAZINE  (APRESOLINE ) 10 MG tablet, TAKE 1 TABLET BY MOUTH 2 TIMES DAILY AS NEEDED., Disp: 180 tablet, Rfl: 1   hydroxychloroquine (PLAQUENIL) 200 MG tablet, Take by mouth 2 (two) times daily., Disp: , Rfl:    hydrOXYzine  (ATARAX ) 10 MG tablet, Take 1 tablet (10 mg total) by mouth 3 (three) times daily as needed., Disp: 30 tablet, Rfl: 0   lamoTRIgine  (LAMICTAL ) 200 MG tablet, Take 400 mg by mouth at bedtime., Disp: , Rfl:    LINZESS  145 MCG CAPS capsule, Take 145 mcg by mouth daily., Disp: , Rfl:    loratadine  (CLARITIN )  10 MG tablet, Take 1 tablet (10 mg total) by mouth daily., Disp: 90 tablet, Rfl: 1   Melatonin 10 MG TABS, Take 1 tablet by mouth at bedtime., Disp: , Rfl:    metFORMIN  (GLUCOPHAGE -XR) 500 MG 24 hr tablet, TAKE 1 TABLET BY MOUTH DAILY  WITH BREAKFAST, Disp: 100 tablet, Rfl: 2   mycophenolate  (MYFORTIC) 360 MG TBEC EC tablet, Take 720 mg by mouth 2 (two) times daily., Disp: , Rfl:    olopatadine  (PATANOL) 0.1 % ophthalmic solution, Place 1 drop into both eyes 2 (two) times daily., Disp: 5 mL, Rfl: 1   omeprazole  (PRILOSEC) 40 MG capsule, Take 1 capsule by mouth in the morning and at bedtime. Pulmonologist, Disp: , Rfl:    ondansetron  (ZOFRAN ) 4 MG tablet, Take 1 tablet (4 mg total) by mouth every 8 (eight) hours as needed for nausea or vomiting., Disp: 20 tablet, Rfl: 0   predniSONE  (DELTASONE ) 1 MG tablet, Take 1 mg by mouth daily with breakfast., Disp: , Rfl:    telmisartan  (MICARDIS ) 80 MG tablet, TAKE 1 TABLET BY MOUTH DAILY, Disp: 100 tablet, Rfl: 2   traZODone (DESYREL) 100 MG tablet, Take 150 mg by mouth at bedtime., Disp: , Rfl:    triamcinolone  ointment (KENALOG ) 0.1 %, , Disp: , Rfl:    carboxymethylcellulose 1 % ophthalmic solution, Apply to eye. (Patient not taking: Reported on 10/10/2023), Disp: , Rfl:    famotidine (PEPCID) 20 MG tablet, Take 20 mg by mouth daily., Disp: , Rfl:   Allergies  Allergen Reactions   Ace Inhibitors     Other reaction(s): OTHER Pt  states she can not take ace inhibitors. Pt states she can not remember her reaction.    Bee Pollen    Penicillins    Pollen Extract     I personally reviewed active problem list, medication list with the patient/caregiver today.   ROS  Ten systems reviewed and is negative except as mentioned in HPI    Objective  Vitals:   10/10/23 1533  BP: (!) 146/82  Pulse: 63  Resp: 16  SpO2: 95%  Weight: 181 lb 11.2 oz (82.4 kg)  Height: 5' 7 (1.702 m)    Body mass index is 28.46 kg/m.  Physical Exam  Constitutional: Patient appears well-developed and temporal waisting . No distress.  HEENT: head atraumatic, normocephalic, pupils equal and reactive to light, , neck supple Cardiovascular: Normal rate, regular rhythm and normal heart sounds.  No murmur heard. No BLE edema. Pulmonary/Chest: Effort normal and breath sounds normal. No respiratory distress. Abdominal: Soft.  There is no tenderness. Psychiatric: Patient has a normal mood and affect. behavior is normal. Judgment and thought content normal.   Recent Results (from the past 2160 hours)  [facility] MAMMOGRAPHY     Status: None   Collection Time: 09/21/23 12:00 AM  Result Value Ref Range   [facility] Mammogram 0-4 Bi-Rad 0-4 Bi-Rad, Self Reported Normal    Comment: [facility]    Diabetic Foot Exam:     PHQ2/9:    10/10/2023    3:32 PM 08/09/2023    2:49 PM 07/24/2023    1:06 PM 07/04/2023    8:40 AM 06/22/2023    8:26 AM  Depression screen PHQ 2/9  Decreased Interest 0 2 0 1 1  Down, Depressed, Hopeless 0 2 0 1 1  PHQ - 2 Score 0 4 0 2 2  Altered sleeping 0 1 0 1 1  Tired, decreased energy 0 2 0 1 0  Change in appetite 0 2 0 1 0  Feeling bad or failure about yourself  0 0 0 0 0  Trouble concentrating 0 1 0 0 0  Moving slowly or fidgety/restless 0 0 0 0 0  Suicidal thoughts 0 0 0 0 0  PHQ-9 Score 0 10 0 5 3  Difficult doing work/chores Not difficult at all Somewhat difficult Not difficult at all  Not difficult at all    phq 9 is  negative  Fall Risk:    10/10/2023    3:32 PM 08/09/2023    2:48 PM 07/24/2023    1:06 PM 07/04/2023    8:39 AM 06/22/2023    8:26 AM  Fall Risk   Falls in the past year? 1 1 1 1 1   Number falls in past yr: 1 1 1 1 1   Injury with Fall? 0 1 1 0 0  Risk for fall due to : Impaired balance/gait History of fall(s) Impaired balance/gait No Fall Risks No Fall Risks  Follow up Education provided;Falls prevention discussed;Falls evaluation completed Falls prevention discussed;Education provided;Falls evaluation completed Falls prevention discussed;Education provided;Falls evaluation completed Falls prevention discussed Falls prevention discussed     Assessment & Plan  1. Frequent falls (Primary)  Keep visit with neurologist and PT, advised to stop walking on the track since does not want to use walker, go to the gym ( she has silver sneaker)) and use the recumbent bike instead.   2. Unintentional weight loss  With malnutrition now. Discussed stopping Metformin , but she states she is afraid of becoming a diabetic. Stressed importance of being healthy instead and prevent falls. She will log her symptoms to see if secondary to hypoglycemia Follow up with oncologist Discussed weight loss with psychiatrist in the face of stress but also seems very anxious about developing DM  3. Pre-diabetes  Discussed cutting down on Metformin  or at least hold medication for one week to see if feels better    5. Encounter for completion of form with patient  Eat more frequently, add protein
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
## 2023-10-10 NOTE — Unmapped (Signed)
 Flexible endoscope serial number  [id number]  used today by Lawrence Marseilles, MD

## 2023-10-10 NOTE — Unmapped (Signed)
Addended byBrendia Sacks on: 10/10/2023 10:14 AM     Modules accepted: Orders

## 2023-10-11 DIAGNOSIS — E1142 Type 2 diabetes mellitus with diabetic polyneuropathy: Principal | ICD-10-CM

## 2023-10-11 DIAGNOSIS — R2689 Other abnormalities of gait and mobility: Principal | ICD-10-CM
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
## 2023-10-12 ENCOUNTER — Other Ambulatory Visit: Payer: Self-pay | Admitting: Family Medicine

## 2023-10-12 DIAGNOSIS — E88819 Insulin resistance, unspecified: Secondary | ICD-10-CM

## 2023-10-13 DIAGNOSIS — J8489 Other specified interstitial pulmonary diseases: Principal | ICD-10-CM

## 2023-10-13 MED ORDER — PREDNISONE 1 MG TABLET
ORAL_TABLET | Freq: Every day | ORAL | 0 refills | 0.00 days
Start: 2023-10-13 — End: ?

## 2023-10-13 NOTE — Telephone Encounter (Signed)
 Last f/u 06/2023

## 2023-10-13 NOTE — Telephone Encounter (Signed)
 Requested medication (s) are due for refill today: yes  Requested medication (s) are on the active medication list: yes  Last refill:  01/01/23 #100 2 RF  Future visit scheduled: yes  Notes to clinic:  overdue lab work    Requested Prescriptions  Pending Prescriptions Disp Refills   metFORMIN  (GLUCOPHAGE -XR) 500 MG 24 hr tablet [Pharmacy Med Name: metFORMIN  HCl ER 500 MG Oral Tablet Extended Release 24 Hour] 100 tablet 2    Sig: TAKE 1 TABLET BY MOUTH DAILY  WITH BREAKFAST     Endocrinology:  Diabetes - Biguanides Failed - 10/13/2023  3:59 PM      Failed - HBA1C is between 0 and 7.9 and within 180 days    Hgb A1c MFr Bld  Date Value Ref Range Status  10/24/2022 5.9 (H) <5.7 % of total Hgb Final    Comment:    For someone without known diabetes, a hemoglobin  A1c value between 5.7% and 6.4% is consistent with prediabetes and should be confirmed with a  follow-up test. . For someone with known diabetes, a value <7% indicates that their diabetes is well controlled. A1c targets should be individualized based on duration of diabetes, age, comorbid conditions, and other considerations. . This assay result is consistent with an increased risk of diabetes. . Currently, no consensus exists regarding use of hemoglobin A1c for diagnosis of diabetes for children. .          Passed - Cr in normal range and within 360 days    Creat  Date Value Ref Range Status  04/10/2023 0.85 0.50 - 1.05 mg/dL Final   Creatinine, Ser  Date Value Ref Range Status  06/16/2023 0.97 0.57 - 1.00 mg/dL Final   Creatinine, Urine  Date Value Ref Range Status  11/07/2019 62 20 - 275 mg/dL Final         Passed - eGFR in normal range and within 360 days    GFR, Est African American  Date Value Ref Range Status  06/15/2020 72 > OR = 60 mL/min/1.80m2 Final   GFR, Est Non African American  Date Value Ref Range Status  06/15/2020 62 > OR = 60 mL/min/1.49m2 Final   GFR, Estimated  Date Value Ref  Range Status  06/13/2023 >60 >60 mL/min Final    Comment:    (NOTE) Calculated using the CKD-EPI Creatinine Equation (2021)    eGFR  Date Value Ref Range Status  06/16/2023 65 >59 mL/min/1.73 Final         Passed - B12 Level in normal range and within 720 days    Vitamin B-12  Date Value Ref Range Status  07/12/2022 466 200 - 1,100 pg/mL Final         Passed - Valid encounter within last 6 months    Recent Outpatient Visits           2 months ago Encounter for completion of form with patient   [facility] Gareth Mliss FALCON, FNP   2 months ago Acute right-sided thoracic back pain   [facility] Mecum, Rocky BRAVO, PA-C   3 months ago Atherosclerosis of aorta [facility])   [facility] Glenard Mire, MD   3 months ago Atypical chest pain   [facility] Gareth Mliss FALCON, FNP   6 months ago E. coli UTI   [facility] Sowles, Krichna, MD       Future Appointments  In 2 months Sowles, Krichna, MD [facility]            Passed - CBC within normal limits and completed in the last 12 months    WBC  Date Value Ref Range Status  06/13/2023 4.3 4.0 - 10.5 K/uL Final   RBC  Date Value Ref Range Status  06/13/2023 4.82 3.87 - 5.11 MIL/uL Final   Hemoglobin  Date Value Ref Range Status  06/13/2023 13.3 12.0 - 15.0 g/dL Final   HGB  Date Value Ref Range Status  12/10/2013 14.3 12.0 - 16.0 g/dL Final   HCT  Date Value Ref Range Status  06/13/2023 41.2 36.0 - 46.0 % Final  12/10/2013 42.4 35.0 - 47.0 % Final   MCHC  Date Value Ref Range Status  06/13/2023 32.3 30.0 - 36.0 g/dL Final   [facility]  Date Value Ref Range Status  06/13/2023 27.6 26.0 - 34.0 pg Final   MCV  Date Value Ref Range Status  06/13/2023 85.5 80.0 - 100.0 fL Final  12/10/2013 84 80 - 100 fL Final   No results found for: PLTCOUNTKUC,  LABPLAT, POCPLA RDW  Date Value Ref Range Status  06/13/2023 13.2 11.5 - 15.5 % Final  12/10/2013 14.1 11.5 - 14.5 % Final

## 2023-10-13 NOTE — Unmapped (Signed)
Requested Prescriptions     Pending Prescriptions Disp Refills    predniSONE (DELTASONE) 1 MG tablet [Pharmacy Med Name: PREDNISONE 1 MG TABLET] 90 tablet 0     Sig: TAKE 1 TABLET (1 MG TOTAL) BY MOUTH DAILY. TO START ONCE PREDNISONE TAPER HAS BEEN COMPLETED (SHOULD BE ON/AROUND 05/25/23)    Patient was last seen 06/26/23  Follow up scheduled for 02/14/24

## 2023-10-13 NOTE — Unmapped (Signed)
Did not authorize refill as it seems this was discontinued at last visit.     Will leave refill pended for when Dr. Barth Kirks returns.

## 2023-10-18 MED ORDER — PREDNISONE 1 MG TABLET
ORAL_TABLET | Freq: Every day | ORAL | 0 refills | 90.00 days
Start: 2023-10-18 — End: ?

## 2023-10-18 NOTE — Unmapped (Signed)
Prednisone discontinued.

## 2023-10-19 ENCOUNTER — Ambulatory Visit: Admit: 2023-10-19 | Discharge: 2023-10-20 | Payer: MEDICARE

## 2023-10-19 ENCOUNTER — Inpatient Hospital Stay: Admit: 2023-10-19 | Discharge: 2023-10-20 | Payer: MEDICARE

## 2023-10-19 DIAGNOSIS — M79672 Pain in left foot: Secondary | ICD-10-CM | POA: Diagnosis not present

## 2023-10-19 DIAGNOSIS — M25462 Effusion, left knee: Secondary | ICD-10-CM | POA: Diagnosis not present

## 2023-10-19 DIAGNOSIS — S8992XA Unspecified injury of left lower leg, initial encounter: Secondary | ICD-10-CM | POA: Diagnosis not present

## 2023-10-19 DIAGNOSIS — M25562 Pain in left knee: Secondary | ICD-10-CM | POA: Diagnosis not present
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
## 2023-10-19 NOTE — Unmapped (Signed)
 Thank you for choosing [facility]!  We appreciate the opportunity to participate in your care.      If any questions or concerns arise after your visit, please do not hesitant to contact me by [facility] or by calling our clinical team at [phone].    Please let me know if I can be of assistance with this or other orthopaedic issues in the future.

## 2023-10-30 ENCOUNTER — Ambulatory Visit: Admit: 2023-10-30 | Discharge: 2023-10-31 | Payer: MEDICARE | Attending: Psychologist | Primary: Psychologist

## 2023-10-30 DIAGNOSIS — F410 Panic disorder [episodic paroxysmal anxiety] without agoraphobia: Principal | ICD-10-CM

## 2023-10-30 DIAGNOSIS — F411 Generalized anxiety disorder: Principal | ICD-10-CM

## 2023-10-30 DIAGNOSIS — F32A Depressive disorder: Principal | ICD-10-CM

## 2023-10-30 DIAGNOSIS — F4310 Post-traumatic stress disorder, unspecified: Principal | ICD-10-CM

## 2023-10-30 DIAGNOSIS — F331 Major depressive disorder, recurrent, moderate: Principal | ICD-10-CM

## 2023-10-30 MED ORDER — LAMOTRIGINE 200 MG TABLET
ORAL_TABLET | Freq: Two times a day (BID) | ORAL | 2 refills | 0.00 days
Start: 2023-10-30 — End: ?

## 2023-10-30 NOTE — Unmapped (Signed)
 [facility]   Confidential Psychological Therapy Session      Patient Name: Caitlin Smith  Medical Record Number: [id number]  Date of Service: October 30, 2023  Attending Psychologist: Caroline More, PhD  CPT Procedure Codes: 81191 for 60 mins of face to face counseling    This visit was attempted via face to face with interactive technology using a HIPPA compliant audio/visual platform; patient unable to connect and so telephone was used. Her computer apparently broke last night. We reviewed confidentiality today. The patient was present in [place], a state in which this provider is licensed and able to provide care (location and contact information confirmed), attended this visit alone, and consented to this virtual pain psychology visit.    REFERRING PHYSICIAN: Clarene Essex, MD    CHIEF COMPLAINT AND REASON FOR VISIT: pain coping skills, CBT to address depression and anxiety in the setting of chronic pain    SUBJECTIVE / HISTORY OF PRESENT ILLNESS: [name] is a very pleasant 68 y.o.  female from [place], [place] with multiple chronic pain complaints related to fibromyalgia and lupus who initially met with me in October 2016, at which time she was diagnosed with severe depression, PTSD, panic disorder, and generalized anxiety. The patient returns for a therapy session today. Last follow up with me was in 10/03/23.    Pt has felt more depressed lately, socializing less, still does not have a car, trying to figure out how to address. Discussed how I have seen this seasonal affective pattern for her for a few years now. Typically has met with psychiatrist to discuss if med changes would be helpful. Needs to make an appointment, thinks her psychiatrist is back from maternity leave now so agrees to call. Reviewed behavioral activation and social activity planning. Spent extra time developing a plan - call senior center and learn about transportation - try to go to senior center 2x/week if possible. Denies any SI or safety concerns. To try to get outside and walk, in nature as possble, daily, weather permitting.     OBJECTIVE / MENTAL STATUS:    Appearance:   Appears stated age and Clean/Neat   Motor:  No abnormal movements   Speech/Language:   Normal rate, volume, tone, fluency   Mood:  Depressed and Anxious   Affect:  euthymic   Thought process:  Logical, linear, clear, coherent, goal directed   Thought content:    Denies SI, HI, self harm, delusions, obsessions, paranoid ideation, or ideas of reference   Perceptual disturbances:    Denies auditory and visual hallucinations, behavior not concerning for response to internal stimuli   Orientation:  Oriented to person, place, time, and general circumstances   Attention:  Able to fully attend without fluctuations in consciousness   Concentration:  Able to fully concentrate and attend   Memory:  Immediate, short-term, long-term, and recall grossly intact    Fund of knowledge:   Consistent with level of education and development   Insight:    Fair   Judgment:   Intact   Impulse Control:  Intact     DIAGNOSTIC IMPRESSION:   Post Traumatic Stress Disorder (PTSD)  Generalized Anxiety Disorder (GAD)  Panic Disorder  Major Depressive Disorder, moderate, recurrent  Chronic pain syndrome  Fibromyalgia  Lupus    ASSESSMENT:   [name] is a very pleasant 68 y.o.  female from [place], [place] with multiple chronic pain complaints related to lupus, arthritis, and fibromyalgia. She was  previously seen in our clinic from 2012-2014, and reestablished care with Dr. Fayrene Fearing in August 2016. The patient has struggled with depression and anxiety, but is very motivated to participate in intensive, multidisciplinary treatment to address the connection between depression, anxiety and pain. She has a long-standing history of depression and anxiety, and has worked with outpatient psychiatrist and therapist over the years. She is currently established with [facility] psychiatry.  In general, depression, anxiety, pain coping and panic have all improved (until recent bout with pulmonary issues), but the patient evidences a seasonal affective pattern of mood changes typically her mood tends to be worse during the winter.  Encouraged early morning sunlight and getting outside and walking as tolerated, building cardiovascular and muscular strength and endurance over time. Also encouraged practice of daily behavioral relaxation exercises as well.     Most recently has had neurological symptoms, and saw her neurologist, has a brain MRI scheduled for tomorrow.     PLAN:   (1) Psychotherapy - Continue CBT. CBT will be used to address PTSD, MDD, Panic D/o, GAD, and chronic pain.     --Behavioral Activation - Encouraged behavioral activation.  Is trying. Walking in AM.  --Downloaded and uses Insight Timer, guided relaxation app, for nightly practice.  --Continues to utilize diaphragmatic breathing daily  --encouraged early morning sunlight / light therapy for possible SAD in the setting of chronic MDD    (2) Psychiatry - Pt currently established with [facility].  Is followed by Dr. Evette Cristal (attending) but typically has appointments with a psychiatrist resident, Dr. Virl Cagey.    (3) Safety - Pt denies any current or recent SI or safety concerns. Knows to call 911 or go to her local ED. Also previously given [facility].

## 2023-10-31 DIAGNOSIS — M19019 Primary osteoarthritis, unspecified shoulder: Secondary | ICD-10-CM | POA: Diagnosis not present

## 2023-10-31 DIAGNOSIS — M170 Bilateral primary osteoarthritis of knee: Secondary | ICD-10-CM | POA: Diagnosis not present

## 2023-10-31 DIAGNOSIS — G629 Polyneuropathy, unspecified: Secondary | ICD-10-CM | POA: Diagnosis not present

## 2023-10-31 MED ORDER — LAMOTRIGINE 200 MG TABLET
ORAL_TABLET | Freq: Two times a day (BID) | ORAL | 2 refills | 100.00 days | Status: CP
Start: 2023-10-31 — End: ?

## 2023-11-01 DIAGNOSIS — M19019 Primary osteoarthritis, unspecified shoulder: Secondary | ICD-10-CM | POA: Diagnosis not present

## 2023-11-01 DIAGNOSIS — G629 Polyneuropathy, unspecified: Secondary | ICD-10-CM | POA: Diagnosis not present

## 2023-11-01 DIAGNOSIS — M170 Bilateral primary osteoarthritis of knee: Secondary | ICD-10-CM | POA: Diagnosis not present

## 2023-11-02 NOTE — Unmapped (Signed)
 [facility] Refill Coordination Note    LIBNI FUSARO, DOB: 06-02-1956  Phone: [phone] (home)       All above HIPAA information was verified with patient.         11/01/2023    10:19 AM   Specialty Rx Medication Refill Questionnaire   Which Medications would you like refilled and shipped? Mycophenolic Acid   Please list all current allergies: Pollen   Have you missed any doses in the last 30 days? No   Have you had any changes to your medication(s) since your last refill? No   How many days remaining of each medication do you have at home? About 2 weeks   If receiving an injectable medication, next injection date is 11/13/2023   Have you experienced any side effects in the last 30 days? No   Please enter the full address (street address, city, state, zip code) where you would like your medication(s) to be delivered to. [address] [id number], [place], [place] [address]   Please specify on which day you would like your medication(s) to arrive. Note: if you need your medication(s) within 3 days, please call the pharmacy to schedule your order at [phone]  11/13/2023   Has your insurance changed since your last refill? No   Would you like a pharmacist to call you to discuss your medication(s)? No   Do you require a signature for your package? (Note: if we are billing Medicare Part B or your order contains a controlled substance, we will require a signature) No   Additional Comments: Thanks for the reminder         Completed refill call assessment today to schedule patient's medication shipment from the [facility] [phone]).  All relevant notes have been reviewed.       Confirmed patient received a Conservation officer, historic buildings and a Surveyor, mining with first shipment. The patient will receive a drug information handout for each medication shipped and additional FDA Medication Guides as required.         REFERRAL TO PHARMACIST     Referral to the pharmacist: Not needed      [facility]     Shipping address confirmed in Epic.     Delivery Scheduled: Yes, Expected medication delivery date: 11/13/23.     Medication will be delivered via UPS to the prescription address in Epic WAM.    Unk Lightning   [facility] Specialty Technician

## 2023-11-03 NOTE — Unmapped (Signed)
 [facility] Other Call ONC Phone Room Smart Lists: General -     Hi,     Anani contacted the Communication Center regarding the following:    - is calling to speak with the care team to discuss if her notes that she is looking at are correct and if she needs a biopsy scheduled per [place], this admin only saw where pt needs to return in a year for annual imaging and clinic appt    Please contact [place] at [phone].    Thanks in advance,    Rosary Lively  [facility]   [phone]    [facility]

## 2023-11-03 NOTE — Unmapped (Signed)
 [facility] Triage Note    Reason for call: Return call    Time for incident: 1010 - 1023    Last seen in clinic on 09/22/23  Mammo 09/21/23     Phone Assessment: Spoke with pt regarding call request.  She had concerns that she was supposed to follow up with biopsy of breast.  Per mammo results and provider clinic note, no mention of needing biopsy.  Recommendation is return to clinic with mammo in 1 year.   States she read in paperwork from clinic discharge that she was supposed to stop at desk on the way out to schedule biopsy.  Let her know I could not find any information regarding this. Pt appreciative of call back and reassurance.      Triage Recommendations: none     Caller's Response: Thank you so much.     Outstanding tasks: Care team notified, no further action needed.

## 2023-11-06 ENCOUNTER — Other Ambulatory Visit: Payer: Self-pay | Admitting: Family Medicine

## 2023-11-06 DIAGNOSIS — I10 Essential (primary) hypertension: Secondary | ICD-10-CM

## 2023-11-07 ENCOUNTER — Encounter: Admit: 2023-11-07 | Payer: MEDICARE

## 2023-11-07 ENCOUNTER — Ambulatory Visit: Admit: 2023-11-07 | Payer: MEDICARE

## 2023-11-07 DIAGNOSIS — R490 Dysphonia: Principal | ICD-10-CM

## 2023-11-07 NOTE — Unmapped (Signed)
 [facility] SPEECH  [address]  OUTPATIENT SPEECH PATHOLOGY  11/07/2023      Patient Name: Caitlin Smith  Date of Birth:[birth year]  Session Number: 2  Diagnosis:   Encounter Diagnosis   Name Primary?    Dysphonia Yes           Date of Evaluation: 10/10/23  Date of Symptom Onset: 10/09/98  Referred by: Lawrence Marseilles, MD         Dates of Certification: expires 02/07/2024    Chief Complaint: chronic cough; dysphonia    Note Type: Treatment Note    ASSESSMENT:     Next Visit Plan: abdominal support of phonation, resonant voice, balanced phonation, conversation training    OBJECTIVE:  Today's session focused on instruction of voice therapy concepts and exercises targeting abdominal support of phonation, resonant voice, and respiratory retraining for cough suppression. Patient was ~90% accurate for relaxed throat breathing given max cues, model, and explanation from clinician. Patient successfully utilized the breathing technique to suppress cough x 2 during today's session. Patient was 100% accurate for adequate breath support during volume manipulation of voiceless fricative /sh/ given model and explanation from clinician. Patient was ~70% accurate for adequate breath support with forward resonance on /m/ when preceding with boom at the word level given max cues to use a forward resonance and model from clinician.    Stimulability: Pt was very stimulable  Treatment Recommendations: Continue Treatment    PLAN:  SLP Follow-up / Frequency: 1x week for Planned Treatment Duration : 1 session per week-every other week x9 over 3 months      Planned Interventions: Conversational Coaching, Cough therapy, Diaphragmatic breathing/respiratory retraining, Resonant Voice Therapy Semi-occluded Vocal Tract Exercises, Financial controller, Functional Phonation Tasks    Prognosis:  Good    Negative Prognosis Rationale: Time post onset, Severity of deficits       Positive Prognosis Rationale: Motivation, Response to trial treatment      Goals:      STG 1: Breathing: Pt. will demonstrate abdominal breathing for support of phonation with 80% accuracy given minimal cues    STG 2: VCD/Cough: Pt. will minimize or eliminate cough and/or Vocal Cord Dysfunction by implementing learned strategies with 100% accuracy given minimal cues.    STG 3: Resonance: Pt will balance oral/nasal resonance to maximize vocal output with minimal effort/laryngeal hyperfunction with 80% accuracy given minimal cues    STG 4: Strength/balance: Pt will use balance of vocal fold adduction and airflow to produce voice in structured exercises and spontaneous speech tasks with 80% accuracy given minimal cues    STG 5: Inflection: Pt will vary pitch and/or loudness to demonstrate phonatory flexibility and laryngeal control without laryngeal hyperfunction in sustained phonation exercises and conversational speech with 80% accuracy given minimal cues      Time Frame: 3  Duration: months    LTG #1: At the completion of Tx, Pt will demonstrate ability to perform voice therapy techniques independently to maintain optimal laryngeal function (+/-)       Time Frame: 3  Duration: months       SUBJECTIVE:  Patient returns with report that her cough and voice related concerns have remained stable since evaluation.  Pain?: No      Precautions: None      Education Provided: Patient, SLP Plan of Care, Strategies to maximize voicing, Compensatory VCD/Cough Strategies    Response to Education: Understanding demonstrated          Session Duration : 30  Today's Charges (noted here with $$):  SLP Treatment Charges  $$ 92507-Treatment S/L/V - individual [mins]: 30                   I attest that I have reviewed the above information.  Signed: Patricia Nettle, SLP  11/07/2023 10:51 AM      The patient reports they are physically located in [place] and is currently: at home. I conducted a audio/video visit. I spent  79m 25s on the video call with the patient. I spent an additional 5 minutes on pre- and post-visit activities on the date of service .

## 2023-11-10 ENCOUNTER — Ambulatory Visit: Admit: 2023-11-10 | Discharge: 2023-11-11 | Payer: MEDICARE

## 2023-11-10 DIAGNOSIS — M3219 Other organ or system involvement in systemic lupus erythematosus: Principal | ICD-10-CM

## 2023-11-10 MED ADMIN — triamcinolone acetonide (KENALOG-40) injection 40 mg: 40 mg | INTRA_ARTICULAR | @ 15:00:00 | Stop: 2023-11-10

## 2023-11-10 MED FILL — MYCOPHENOLATE SODIUM 360 MG TABLET,DELAYED RELEASE: ORAL | 30 days supply | Qty: 120 | Fill #2

## 2023-11-10 NOTE — Unmapped (Signed)
 [facility]  Encounter Provider: Ancil Linsey, NP  Date of Service: 11/10/2023 Last encounter Orthopaedics: Visit date not found   Last encounter this provider: 10/19/2023      Notes:     Primary Care Provider: Anastasio Champion, MD  Referring Provider: Johnette Abraham [facility]    ICD-10-CM   1. Osteoarthritis of left knee, unspecified osteoarthritis type  M17.12    Orthopaedic notes: L Knee OA, RTKA 04/28/15DD    Physical Function CAT Score: (not recorded)  Pain Interference CAT Score: (not recorded)  Depression CAT Score: (not recorded)  Sleep CAT Score: (not recorded)  [email or web address]     Caitlin Smith is a 68 y.o. female   ASSESSMENT   Left lower extremity pain status post fall, much improved  Knee osteoarthritis        PLAN:   Risks of procedure were discussed.  The patient verbalized understanding and provided consent.  A timeout was performed prior to procedure identifying the correct patient, location, medication.  4 ml lidocaine and 40 mg kenalog were injected into the L knee using aseptic technique.  The patient tolerated this well.   Advised to avoid vigorous activities for the next few days and then the patient may proceed with activity as tolerated.    Follow up will be as needed        Requested Prescriptions      No prescriptions requested or ordered in this encounter      No orders of the defined types were placed in this encounter.      History:  Reason for visit: leg injury  HPI:    The patient presents with knee pain following a fall.    She experiences knee pain following a fall down three stairs on October 18, 2023. X-rays taken the following day showed evidence of knee osteoarthritis but no fracture. She continues to experience knee pain and uses a brace for support. The knee 'just hurts' without any locking or catching. There is no instability when wearing the brace, but the knee hurts more than her toe.    She has a history of receiving knee injections for pain management, but her previous provider is no longer available. It has been a long time since her last injection, and she is willing to receive one today.    She mentions experiencing frequent falls and has been told by a couple of doctors that she has fatigue. She plans to see her neurologist regarding these issues.          Exam:  The encounter diagnosis was Osteoarthritis of left knee, unspecified osteoarthritis type.   Estimated body mass index is 28.82 kg/m?? as calculated from the following:    Height as of 10/10/23: 170.2 cm (5' 7).    Weight as of 10/10/23: 83.5 kg (184 lb).   Physical Exam    EXTREMITIES: Foot non-tender, skin intact, no swelling, full range of motion.  MUSCULOSKELETAL: Knee slightly tender on palpation.          Musculoskeletal   Nontender over the left foot.  Tender over the medial and lateral joint lines of the left knee.  Range of motion from 0-120.  No effusion.  Skin intact with no erythema or ecchymosis           Test Results  The encounter diagnosis was Osteoarthritis of left knee, unspecified osteoarthritis type.  No results found for: A1C    No results found for: VITD    Imaging  No orders of the defined types were placed in this encounter.        DME ORDER:  Dx:  ,

## 2023-11-10 NOTE — Unmapped (Signed)
 Thank you for choosing [facility]!  We appreciate the opportunity to participate in your care.      If any questions or concerns arise after your visit, please do not hesitant to contact me by [facility] or by calling our clinical team at [phone].    Please let me know if I can be of assistance with this or other orthopaedic issues in the future.

## 2023-11-15 ENCOUNTER — Telehealth: Payer: Self-pay | Admitting: Family Medicine

## 2023-11-15 NOTE — Telephone Encounter (Signed)
 Pt would like to know if you decided if she needed a pap smear. Please return call

## 2023-11-15 NOTE — Telephone Encounter (Signed)
 Spoke with pt and appt scheduled for April

## 2023-11-15 NOTE — Telephone Encounter (Signed)
 Patient is due for a CPE with pap, if she gets them done here with PCP

## 2023-11-16 ENCOUNTER — Encounter
Admit: 2023-11-16 | Discharge: 2023-11-17 | Payer: MEDICARE | Attending: Student in an Organized Health Care Education/Training Program | Primary: Student in an Organized Health Care Education/Training Program

## 2023-11-16 DIAGNOSIS — F411 Generalized anxiety disorder: Principal | ICD-10-CM

## 2023-11-16 DIAGNOSIS — F3342 Major depressive disorder, recurrent, in full remission: Principal | ICD-10-CM

## 2023-11-16 MED ORDER — CLONAZEPAM 0.5 MG TABLET
ORAL_TABLET | Freq: Three times a day (TID) | ORAL | 1 refills | 20 days | Status: CP | PRN
Start: 2023-11-16 — End: ?
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
## 2023-11-17 ENCOUNTER — Ambulatory Visit: Admit: 2023-11-17 | Discharge: 2023-11-18 | Payer: MEDICARE

## 2023-11-17 LAB — HEMOGLOBIN A1C
ESTIMATED AVERAGE GLUCOSE: 103 mg/dL
HEMOGLOBIN A1C: 5.2 % (ref 4.8–5.6)

## 2023-11-17 LAB — T4, FREE: FREE T4: 1.28 ng/dL (ref 0.89–1.76)

## 2023-11-17 LAB — CERULOPLASMIN: CERULOPLASMIN: 31 mg/dL (ref 15.0–52.0)

## 2023-11-17 LAB — TSH: THYROID STIMULATING HORMONE: 0.626 u[IU]/mL (ref 0.550–4.780)

## 2023-11-17 LAB — VITAMIN B12: VITAMIN B-12: 482 pg/mL (ref 211–911)

## 2023-11-17 LAB — FOLATE: FOLATE: 14.2 ng/mL (ref >=5.4)

## 2023-11-17 NOTE — Unmapped (Addendum)
 It was a pleasure to see you today.     Check on your Flexeril at home. If you are already taking this every night, you can increase to 10mg  at night (2 tablets). If you are NOT taking this every night, start with 5mg  (1 tablet). Let me know how this is going in a few weeks on My Chart.     Call [facility] Clinical Neurophysiology Lab [phone] to schedule the EMG.     Go to the lab after our visit. I will be in touch with those results as they come back.     Follow up: with Dr. Fabian November as previously scheduled.    If you have any questions in between visits, please send a message via MyChart or you can call our office. MyChart and the Clinic Triage are for non-urgent medical questions. If you are experiencing a medical emergency, please proceed to the closest Emergency Department.     Viviano Simas MSN, APRN, AGPCNP-BC  Nurse Practitioner, General Neurology  [facility] Department of Neurology  [address] [place], [place] [address]  Phone: [phone]   Fax: [phone]
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
## 2023-11-21 LAB — COPPER, SERUM: COPPER: 125 ug/dL

## 2023-11-22 LAB — VITAMIN B1, WHOLE BLOOD: VITAMIN B1: 98 nmol/L

## 2023-11-22 LAB — VITAMIN A: VITAMIN A RESULT: 50.4 ug/dL

## 2023-11-22 LAB — VITAMIN E: VITAMIN E LEVEL: 7.9 mg/L

## 2023-11-23 ENCOUNTER — Encounter: Admit: 2023-11-23

## 2023-11-23 ENCOUNTER — Ambulatory Visit: Admit: 2023-11-23 | Payer: MEDICARE

## 2023-11-23 ENCOUNTER — Ambulatory Visit
Admit: 2023-11-23 | Payer: MEDICARE | Attending: Rehabilitative and Restorative Service Providers" | Primary: Rehabilitative and Restorative Service Providers"

## 2023-11-23 LAB — VITAMIN B2: VITAMIN B2: 6 ug/L

## 2023-11-23 NOTE — Unmapped (Addendum)
 [facility] PT [facility] [place]  OUTPATIENT PHYSICAL THERAPY  11/23/2023  Note Type: Evaluation       Patient Name: Caitlin Smith  Date of Birth:07-16-1956  Diagnosis:   Encounter Diagnoses   Name Primary?    Chronic pain syndrome     Fibromyalgia     Spinal stenosis of lumbar region, unspecified whether neurogenic claudication present Yes     Referring Provider: Staci Righter    Date of Onset of Impairment: 11/23/2022  Date PT Care Plan Established or Reviewed: 12/05/2023  Date PT Treatment Started: 11/23/2023     Plan of Care Effective Date: 11/23/2023 - 02/13/2024  Session Number:  1     ASSESSMENT & PLAN   Assessment  Assessment details:      Jeiry is a pleasant 68 y.o. female who presents for Physical Therapy Evaluation with Chronic low back pain in setting of DDD and multiple joint pain due to fibromyalgia ongoing > 1 year. Primary impairments include difficulty with prolonged ambulation > 5 min, bending, lifting, twisting, sit to stand transfers, prolonged standing, and household chores. Clinical presentation today is consistent with low back pain due to DDD and multiple joint pain due to fibromyalgia. Patient has a history of chronic pain related to lupus and arthritis and is on rheumatological therapy The patient will benefit from skilled Physical Therapy intervention to address the moderate impairments listed below and to assist the patient in maximizing her functional independence and safe return to prior level of function.             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) (CMS-HCC) Chronic kidney disease, stage I Dysphonia Mixed urge and stress incontinence Myalgia and myositis Osteoarthrosis Proteinuria Sensorineural hearing loss VIN II (vulvar intraepithelial neoplasia II) Family history of breast cancer Pain medication agreement signed Episcleritis SLE (systemic lupus erythematosus) (CMS-HCC) S/P total knee replacement Asthma Postinflammatory pulmonary fibrosis (CMS-HCC)    Examination of Body Systems: musculoskeletal, activity/participation and communication    Clinical Presentation: stable    Clinical Decision Making: complex and moderate    Prognosis: good prognosis    Negative Prognosis Rationale: medical status/condition, chronicity of condition, severity of symptoms, ADL performance, endurance and strength.      Therapy Goals      Goals:        1. In 12 weeks the patient will demonstrate independent performance of HEP to maintain functional gains.   2. In 12 weeks: Pt. will have improved Lower extremity strength of 4+/5 in order to perform her house chores and ambulate in the community safely.  3. In 12 weeks the patient will score a 10 point change on the ODI to demonstrate the MDC (0-100%; lower score indicates a lesser level of disability) and to indicate improved activity tolerance.   4. In 12 weeks the patient will complete an additional 3 STS transfers during the 30s chair stand test to met the MDC and to demonstrate increased functional leg strength.        Plan    Therapy options: will be seen for skilled physical therapy services    Planned therapy interventions: Location manager, Education - Patient, Endurance Activites, Functional Mobility, Home Exercise Program, Education - Family/Caregiver, E-Stim, TENS, Civil engineer, contracting, Diaphragmatic/Pursed-lip Breathing, 97113-Aquatic Therapy, 97112-Neuromuscular Re-education, 97110-Therapeutic Exercises, 97116-Gait Training, 97140-Manual Therapy,  97530-Therapeutic Activities, 97535-Self-Care/Home Training, 97750-Physical Performance Test and 14782, 20561-Dry Needling 1-2, 3+ areas    DME Equipment: [employer].    Frequency: 1x week    Duration in weeks: 12    Education provided to: patient.    Education provided: HEP, Treatment options and plan, Symptom management, Safety education, Importance of Therapy, Anatomy, Body mechanics, Role of therapy in Rehabilitation, Posture, Community resources and Body awareness    Education results: verbalized good understanding, demonstrates understanding and needs reinforcement.    Communication/Consultation: N/A.      Total Session Time: 45    Treatment rendered today:      45        SUBJECTIVE         History of Present Condition     Date of onset:  11/23/2022 (Chronic)    History of Present Condition/Chief Complaint:    Associated Diagnoses    Chronic pain syndrome [G89.4]    Fibromyalgia [M79.7]    Spinal stenosis of lumbar region, unspecified whether neurogenic claudication present [M48.061]      Reason for Exam    Comments: Please assist with patient with using a TENs unit and work on myofascial release techniques for pain in the back      Subjective:  Chronic pain localized to right knee and bilateral neck, shoulders and upper back myofascial pain and fibromyalgia, and lower back 2/2 lumbar facet arthropathy and DDD and moderate degree of lumbar central spinal canal stenosis. Patient has a history of chronic pain related to lupus and arthritis and is on rheumatological therapy.     worsened low back pain. The patient states that the pain feels like her pain goes all the way up her back into her shoulders. The patient confirms that trying to get up and standing makes her pain worse. The patient reports that she had benefit from her facet injections in the past. Per chart review, the patient last had facet injection in April 2022. Although, the patient reports that the neuropathic pain in her feet has improved since last visit, though her hands have not gotten better      Last injections on 1/27 and did not really help. Back is in a lot of pain. I used to walk for an hour. Haven't been able to walk much recently. Feel off balance as well. This year has had 5 falls. Can only stand for 5 min  Pain:     Current pain rating:  9    At best pain rating:  7    At worst pain rating:  10  Location:  Low back    Quality:  Aching    Relieving factors:  Rest    Aggravating factors:  Bending, lifting, performance of leg dominant activites, rising from sitting, stairs, standing and walking    Pain related Behaviors:  Avoidance    Progression:  No change    Red Flags:  None  Precautions/Equipment   Precautions:  Fall risk    Current Braces/Orthoses:  None    Equipment Currently Used:  None  Prior Functional Status     No physical limitations    Current Functional Status:    limited exercise, limited lifting, limited recreation, limited household activities and limited work capacity  Social Support:   Barriers to Learning:  No Barriers  Diagnostic Tests:     Comments:  Impression  Mild multilevel degenerative changes of the spine with facet arthropathy most prominent at L4-L5 and L5-S1. Demineralized osseous structures.  Treatments:     Previous treatment:  Corticosteroid injection and physical therapy    Current treatment: physical therapy    Patient Goals:     Patient/Family goals for therapy:  Decreased pain, increased ROM, increased strength, independence with ADLs/IADLs, improved ambulation, improved standing tolerance and return to recreational activites      Hunger vital sign:  1. Within the past 12 months, we worried whether our food would run out before we got money to buy more. Never True  2. Within the past 12 months, the food we bought just didn't last, and we didn't have money to get more. Never True    If patient identifies with either of the above, patient was provided with local food resource guide and non perishables when possible.    OBJECTIVE     Outcome Measure: Revised Oswestry: Oswestry Score: 36 / 50 or 72 %     Posture/Observation:   Sitting: slouched and forward head  Correction of Posture: no effect  Standing: unremarkable  Gait: decreased gait speed  forward flexed posture    Lumbar Spine ROM  Motion No ROM Loss  (0%) Min ROM Loss  (1-33%) Mod ROM Loss  (34-65%) Major ROM Loss  (66-100%) Symptoms    Flexion   x  Most Pain   Extension   x  Pain   Sideglide Right   x  Pain   Sideglide Left   x  Pain     Special tests:   Right Left   Slump - -   SLR - -   Babinski - -   SIJ cluster: - -   Femoral nerve - -       NEUROLOGICAL:  MOTOR STRENGTH  Muscle Right Left   Hip Flexion 4-/5 4-/5   Knee extension 4-/5 4-/5   Ankle DF 4-/5 4-/5   Great toe extension 4-/5 4-/5   Ankle PF 4-/5 4-/5     SENSATION - LIGHT TOUCH  Dermatome Right Left   L2 Intact Intact   L3 Intact Intact   L4 Intact Intact   L5 Intact Intact   S1 Intact Intact       Palpation: TTP: Lumbar paraspinals L2-L5    TUG: 19.39 sec  30 sec STS: 6 reps with UE assistance      TREATMENT RENDERED     Interpreter Use: Not applicable    Therapeutic Exercise:  15 Minutes   Performed with direct PT demonstration, instruction, supervision, and guidance.   - Education on condition, prognosis, and PT POC  - HEP review as below   - LTR  - STS 24     Manual Therapy: 0 Minutes   -         Next Visit Plan: Progress lumbar mobility and LE strengthening as tolerated, TENS      Total Treatment Time: 40 Minutes  PT Evaluation Charges  $$ PT Evaluation - MOD Complexity [mins]: 30     Therapeutic Interventions Charges  $$ Therapeutic Exercise [mins]: 10                 I attest that I have reviewed the above information.  Signed: Army Chaco, PT, DPT  12/05/2023 7:13 AM        I reviewed the no-show/attendance policy with the patient and caregiver(s). The patient is aware that they must call to cancel appointments more than 24 hours in advance. They are also aware that if they late cancel or no-show three  times, we reserve the right to cancel their remaining appointments. This policy is in place to allow [place] to best serve the needs of our caseload.    If patient returns to clinic with variance in plan of care, then it may be attributable to one or more of the following factors: preferred clinician availability, appointment time request availability, therapy pool appointment availability, major holiday with clinic closure, caregiver availability, patient transportation, conflicting medical appointment, inclement weather, and/or patient illness.    If patient does not return for follow up visit(s) related to this episode of care, this note will serve as their discharge note from Physical Therapy.

## 2023-11-24 LAB — VITAMIN B6: VITAMIN B6: 6 ug/L

## 2023-11-29 NOTE — Unmapped (Signed)
This was a telehealth service and I was in person with the resident.  As the attending physician, I spent 6 minutes in medical discussion with the patient via real-time audio and video, participating in the key portions of the service.  I spent an additional 10 minutes on pre- and post-visit activities which were specific to the patient and included reviewing the patient???s medical records, lab results, imaging results, and other pertinent records. I reviewed the resident's note and I agree with the resident's findings and plan.     Zelphia Cairo, MD

## 2023-12-06 DIAGNOSIS — R490 Dysphonia: Principal | ICD-10-CM

## 2023-12-06 NOTE — Unmapped (Signed)
 [facility] SPEECH [address]  OUTPATIENT SPEECH PATHOLOGY  12/06/2023      Patient Name: Caitlin Smith  Date of Birth:1955-10-29  Session Number: 3  Diagnosis:   Encounter Diagnosis   Name Primary?    Dysphonia Yes        Date of Evaluation: 10/10/23  Date of Symptom Onset: 10/09/98  Referred by: Lawrence Marseilles, MD         Dates of Certification: expires 02/07/2024    Chief Complaint: chronic cough; dysphonia    Note Type: Treatment Note    ASSESSMENT:     Next Visit Plan: abdominal support of phonation, resonant voice, balanced phonation, conversation training    OBJECTIVE:  Today's session focused on instruction of voice therapy concepts and exercises targeting abdominal support of phonation, resonant voice, and respiratory retraining for cough suppression.   Patient was ~90% accurate for relaxed throat breathing given min cues, model, and explanation from clinician. Patient successfully utilized the breathing technique to suppress cough x 1 during today's session.   Patient was 100% accurate for adequate breath support during volume manipulation of voiceless fricative /sh/ given model and explanation from clinician.   ~70% accurate for adequate breath support with forward resonance on /m/ when preceding with boom as well as on /n, w/ at the word level given max fading to min cues to use a forward resonance and model from clinician.    Stimulability: Pt was very stimulable  Treatment Recommendations: Continue Treatment    PLAN:  SLP Follow-up / Frequency: 1x week for Planned Treatment Duration : 1 session per week-every other week x9 over 3 months      Planned Interventions: Conversational Coaching, Cough therapy, Diaphragmatic breathing/respiratory retraining, Resonant Voice Therapy Semi-occluded Vocal Tract Exercises, Financial controller, Functional Phonation Tasks    Prognosis:  Good    Negative Prognosis Rationale: Time post onset, Severity of deficits       Positive Prognosis Rationale: Motivation, Response to trial treatment      Goals:      STG 1: Breathing: Pt. will demonstrate abdominal breathing for support of phonation with 80% accuracy given minimal cues    STG 2: VCD/Cough: Pt. will minimize or eliminate cough and/or Vocal Cord Dysfunction by implementing learned strategies with 100% accuracy given minimal cues.    STG 3: Resonance: Pt will balance oral/nasal resonance to maximize vocal output with minimal effort/laryngeal hyperfunction with 80% accuracy given minimal cues    STG 4: Strength/balance: Pt will use balance of vocal fold adduction and airflow to produce voice in structured exercises and spontaneous speech tasks with 80% accuracy given minimal cues    STG 5: Inflection: Pt will vary pitch and/or loudness to demonstrate phonatory flexibility and laryngeal control without laryngeal hyperfunction in sustained phonation exercises and conversational speech with 80% accuracy given minimal cues    Time Frame: 3  Duration: months    LTG #1: At the completion of Tx, Pt will demonstrate ability to perform voice therapy techniques independently to maintain optimal laryngeal function (+/-)    Time Frame: 3  Duration: months       SUBJECTIVE:  Patient returns with report that her cough and voice related concerns have remained stable since evaluation. She is coughing a little bit more due to allergies and pollen. However, she has been able to suppress episodes of cough with the breathing technique we worked on last session. Her vocal quality continues to fluctuate.   Pain?: No  Precautions: None      Education Provided: Patient, SLP Plan of Care, Strategies to maximize voicing, Compensatory VCD/Cough Strategies    Response to Education: Understanding demonstrated          Session Duration : 20       Today's Charges (noted here with $$):  SLP Treatment Charges  $$ 92507-Treatment S/L/V - individual [mins]: 20                   I attest that I have reviewed the above information.  Signed: Patricia Nettle, SLP  12/06/2023 11:22 AM      The patient reports they are physically located in [place] and is currently: at home. I conducted a audio/video visit. I spent  86m 18s on the video call with the patient. I spent an additional 5 minutes on pre- and post-visit activities on the date of service .

## 2023-12-07 ENCOUNTER — Ambulatory Visit
Admit: 2023-12-07 | Payer: Medicare (Managed Care) | Attending: Rehabilitative and Restorative Service Providers" | Primary: Rehabilitative and Restorative Service Providers"

## 2023-12-07 ENCOUNTER — Ambulatory Visit
Admit: 2023-12-07 | Attending: Rehabilitative and Restorative Service Providers" | Primary: Rehabilitative and Restorative Service Providers"

## 2023-12-07 ENCOUNTER — Ambulatory Visit
Admit: 2023-12-07 | Discharge: 2024-01-05 | Payer: Medicare (Managed Care) | Attending: Rehabilitative and Restorative Service Providers" | Primary: Rehabilitative and Restorative Service Providers"

## 2023-12-07 NOTE — Unmapped (Signed)
 [facility] [place]  OUTPATIENT PHYSICAL THERAPY  12/07/2023  Note Type: Treatment Note       Patient Name: Caitlin Smith  Date of Birth:08-08-1956  Diagnosis:   Encounter Diagnoses   Name Primary?    Chronic pain syndrome     Fibromyalgia Yes    Spinal stenosis of lumbar region, unspecified whether neurogenic claudication present     Balance problem      Referring Provider: Anastasio Champion    Date of Onset of Impairment: 11/23/2022  Date PT Care Plan Established or Reviewed: 12/05/2023  Date PT Treatment Started: 11/23/2023     Plan of Care Effective Date: 11/23/2023 - 02/13/2024  Session Number:  2     ASSESSMENT & PLAN   Assessment  Assessment details:    Today: Caitlin Smith is a pleasant 68 y.o. female who presents for Physical Therapy Evaluation with Chronic low back pain in setting of DDD and multiple joint pain due to fibromyalgia ongoing > 1 year. Session focused around education around TENS unit and how to correctly use and introduction to hip strengthening. Patient able to complete ambulation with minimal gait deviation towards Right. The patient will benefit from skilled Physical Therapy intervention to address the moderate impairments listed below and to assist the patient in maximizing her functional independence and safe return to prior level of function.       Evaluation: Caitlin Smith is a pleasant 68 y.o. female who presents for Physical Therapy Evaluation with Chronic low back pain in setting of DDD and multiple joint pain due to fibromyalgia ongoing > 1 year. Primary impairments include difficulty with prolonged ambulation > 5 min, bending, lifting, twisting, sit to stand transfers, prolonged standing, and household chores. Clinical presentation today is consistent with low back pain due to DDD and multiple joint pain due to fibromyalgia. Patient has a history of chronic pain related to lupus and arthritis and is on rheumatological therapy The patient will benefit from skilled Physical Therapy intervention to address the moderate impairments listed below and to assist the patient in maximizing her functional independence and safe return to prior level of function.             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) (CMS-HCC) Chronic kidney disease, stage I Dysphonia Mixed urge and stress incontinence Myalgia and myositis Osteoarthrosis Proteinuria Sensorineural hearing loss VIN II (vulvar intraepithelial neoplasia II) Family history of breast cancer Pain medication agreement signed Episcleritis SLE (systemic lupus erythematosus) (CMS-HCC) S/P total knee replacement Asthma Postinflammatory pulmonary fibrosis (CMS-HCC)    Examination of Body Systems: musculoskeletal, activity/participation and communication    Clinical Presentation: stable    Clinical Decision Making: complex and moderate    Prognosis: good prognosis    Negative Prognosis Rationale: medical status/condition, chronicity of condition, severity of symptoms, ADL performance, endurance and strength.      Therapy Goals      Goals:        1. In 12 weeks the patient will demonstrate independent performance of HEP to maintain functional gains.   2. In 12 weeks: Pt. will have improved Lower extremity strength of 4+/5 in order to perform her house chores and ambulate in the community safely.  3. In 12 weeks the patient will score a 10 point change on the ODI to demonstrate  the MDC (0-100%; lower score indicates a lesser level of disability) and to indicate improved activity tolerance.   4. In 12 weeks the patient will complete an additional 3 STS transfers during the 30s chair stand test to met the MDC and to demonstrate increased functional leg strength.        Plan    Therapy options: will be seen for skilled physical therapy services    Planned therapy interventions: Balance Training, Education - Patient, Endurance Activites, Functional Mobility, Home Exercise Program, Education - Family/Caregiver, E-Stim, [employer], Civil engineer, contracting, Diaphragmatic/Pursed-lip Breathing, 97113-Aquatic Therapy, 97112-Neuromuscular Re-education, 97110-Therapeutic Exercises, 97116-Gait Training, 97140-Manual Therapy, 97530-Therapeutic Activities, 97535-Self-Care/Home Training, 97750-Physical Performance Test and 96295, 20561-Dry Needling 1-2, 3+ areas    DME Equipment: [employer].    Frequency: 1x week    Duration in weeks: 12    Education provided to: patient.    Education provided: HEP, Treatment options and plan, Symptom management, Safety education, Importance of Therapy, Anatomy, Body mechanics, Role of therapy in Rehabilitation, Posture, Community resources and Body awareness    Education results: verbalized good understanding, demonstrates understanding and needs reinforcement.    Communication/Consultation: N/A.      Total Session Time: 45    Treatment rendered today:      45        SUBJECTIVE         History of Present Condition     Date of onset:  11/23/2022 (Chronic)    History of Present Condition/Chief Complaint:    Associated Diagnoses    Chronic pain syndrome [G89.4]    Fibromyalgia [M79.7]    Spinal stenosis of lumbar region, unspecified whether neurogenic claudication present [M48.061]      Reason for Exam    Comments: Please assist with patient with using a TENs unit and work on myofascial release techniques for pain in the back        Evaluation: chronic pain localized to right knee and bilateral neck, shoulders and upper back myofascial pain and fibromyalgia, and lower back 2/2 lumbar facet arthropathy and DDD and moderate degree of lumbar central spinal canal stenosis. Patient has a history of chronic pain related to lupus and arthritis and is on rheumatological therapy.     worsened low back pain. The patient states that the pain feels like her pain goes all the way up her back into her shoulders. The patient confirms that trying to get up and standing makes her pain worse. The patient reports that she had benefit from her facet injections in the past. Per chart review, the patient last had facet injection in April 2022. Although, the patient reports that the neuropathic pain in her feet has improved since last visit, though her hands have not gotten better      Last injections on 1/27 and did not really help. Back is in a lot of pain. I used to walk for an hour. Haven't been able to walk much recently. Feel off balance as well. This year has had 5 falls. Can only stand for 5 min    Subjective:  Today: Pain has been about the same. No Falls since last time.  Pain:     Current pain rating:  9    At best pain rating:  7    At worst pain rating:  10  Location:  Low back    Quality:  Aching    Relieving factors:  Rest    Aggravating factors:  Bending, lifting, performance of leg dominant activites, rising  from sitting, stairs, standing and walking    Pain related Behaviors:  Avoidance    Progression:  No change    Red Flags:  None  Precautions/Equipment   Precautions:  Fall risk    Current Braces/Orthoses:  None    Equipment Currently Used:  None  Prior Functional Status     No physical limitations    Current Functional Status:    limited exercise, limited lifting, limited recreation, limited household activities and limited work capacity  Social Support:   Barriers to Learning:  No Barriers  Diagnostic Tests:     Comments:  Impression  Mild multilevel degenerative changes of the spine with facet arthropathy most prominent at L4-L5 and L5-S1. Demineralized osseous structures.      Treatments:     Previous treatment:  Corticosteroid injection and physical therapy    Current treatment: physical therapy    Patient Goals:     Patient/Family goals for therapy:  Decreased pain, increased ROM, increased strength, independence with ADLs/IADLs, improved ambulation, improved standing tolerance and return to recreational activites      Hunger vital sign:  1. Within the past 12 months, we worried whether our food would run out before we got money to buy more. Never True  2. Within the past 12 months, the food we bought just didn't last, and we didn't have money to get more. Never True    If patient identifies with either of the above, patient was provided with local food resource guide and non perishables when possible.    OBJECTIVE     Outcome Measure: Revised Oswestry: Oswestry Score: 36 / 50 or 72 %     Posture/Observation:   Sitting: slouched and forward head  Correction of Posture: no effect  Standing: unremarkable  Gait: decreased gait speed  forward flexed posture    Lumbar Spine ROM  Motion No ROM Loss  (0%) Min ROM Loss  (1-33%) Mod ROM Loss  (34-65%) Major ROM Loss  (66-100%) Symptoms    Flexion   x  Most Pain   Extension   x  Pain   Sideglide Right   x  Pain   Sideglide Left   x  Pain     Special tests:   Right Left   Slump - -   SLR - -   Babinski - -   SIJ cluster: - -   Femoral nerve - -       NEUROLOGICAL:  MOTOR STRENGTH  Muscle Right Left   Hip Flexion 4-/5 4-/5   Knee extension 4-/5 4-/5   Ankle DF 4-/5 4-/5   Great toe extension 4-/5 4-/5   Ankle PF 4-/5 4-/5     SENSATION - LIGHT TOUCH  Dermatome Right Left   L2 Intact Intact   L3 Intact Intact   L4 Intact Intact   L5 Intact Intact   S1 Intact Intact       Palpation: TTP: Lumbar paraspinals L2-L5    TUG: 19.39 sec  30 sec STS: 6 reps with UE assistance      TREATMENT RENDERED     Interpreter Use: Not applicable    Therapeutic Exercise:  40 Minutes   Performed with direct PT demonstration, instruction, supervision, and guidance.   - Education on condition, prognosis, and PT POC  - HEP review as below   - LTR  - STS 24   - NuStep seat 10 Arms 12 L1 5 min  - Hooklying banded clamshell GTB  Manual Therapy: 0 Minutes   -         Next Visit Plan: Progress lumbar mobility and LE strengthening as tolerated, TENS      Total Treatment Time: 40 Minutes        Therapeutic Interventions Charges  $$ Therapeutic Exercise [mins]: 40                 I attest that I have reviewed the above information.  Signed: Army Chaco, PT, DPT  12/07/2023 9:53 AM        I reviewed the no-show/attendance policy with the patient and caregiver(s). The patient is aware that they must call to cancel appointments more than 24 hours in advance. They are also aware that if they late cancel or no-show three times, we reserve the right to cancel their remaining appointments. This policy is in place to allow [place] to best serve the needs of our caseload.    If patient returns to clinic with variance in plan of care, then it may be attributable to one or more of the following factors: preferred clinician availability, appointment time request availability, therapy pool appointment availability, major holiday with clinic closure, caregiver availability, patient transportation, conflicting medical appointment, inclement weather, and/or patient illness.    If patient does not return for follow up visit(s) related to this episode of care, this note will serve as their discharge note from Physical Therapy.

## 2023-12-14 NOTE — Unmapped (Signed)
 [facility] Specialty and Home Delivery Pharmacy Refill Coordination Note    Caitlin Smith, DOB: Aug 24, 1956  Phone: [phone] (home)       All above HIPAA information was verified with patient.         12/13/2023    11:24 AM   Specialty Rx Medication Refill Questionnaire   Which Medications would you like refilled and shipped? Mycophenolate 300mg    If medication refills are not needed at this time, please indicate the reason below. That are needed   Please list all current allergies: To many to write down   Have you missed any doses in the last 30 days? No   Have you had any changes to your medication(s) since your last refill? No   How many days remaining of each medication do you have at home? about a week   If receiving an injectable medication, next injection date is 12/20/2023   Have you experienced any side effects in the last 30 days? No   Please enter the full address (street address, city, state, zip code) where you would like your medication(s) to be delivered to. [name] , [place], [place] [address]   Please specify on which day you would like your medication(s) to arrive. Note: if you need your medication(s) within 3 days, please call the pharmacy to schedule your order at [phone]  12/20/2023   Has your insurance changed since your last refill? No   Would you like a pharmacist to call you to discuss your medication(s)? Yes   Please enter the preferred phone number where you can be reached. [phone]   Do you require a signature for your package? (Note: if we are billing Medicare Part B or your order contains a controlled substance, we will require a signature) No   I have been provided my out of pocket cost for my medication and approve the pharmacy to charge the amount to my credit card on file. No   Additional Information Confirm   Additional Comments: Thanks         Completed refill call assessment today to schedule patient's medication shipment from the [facility] [phone]).  All relevant notes have been reviewed.       Confirmed patient received a Conservation officer, historic buildings and a Surveyor, mining with first shipment. The patient will receive a drug information handout for each medication shipped and additional FDA Medication Guides as required.         REFERRAL TO PHARMACIST     Referral to the pharmacist: Yes - patient request      SHIPPING     Shipping address confirmed in Epic.     Delivery Scheduled: Yes, Expected medication delivery date: 12/20/23.     Medication will be delivered via Next Day Courier to the prescription address in Epic WAM.    Arno Lapidus   [facility] Specialty Technician

## 2023-12-15 ENCOUNTER — Other Ambulatory Visit: Payer: Self-pay | Admitting: Family Medicine

## 2023-12-15 DIAGNOSIS — I10 Essential (primary) hypertension: Secondary | ICD-10-CM

## 2023-12-15 NOTE — Unmapped (Signed)
 This pharmacist was notified by a technician that this patient would like to be contacted by the pharmacist per their MyChart questionnaire response.. I have reached out to the patient and  verified $0 copay with patient      Approximate time spent: 0-5 minutes    Sherle Dire, PharmD, Clinical Specialty Pharmacist  [facility]

## 2023-12-17 ENCOUNTER — Encounter: Payer: Self-pay | Admitting: Family Medicine

## 2023-12-18 ENCOUNTER — Encounter
Admit: 2023-12-18 | Discharge: 2023-12-19 | Payer: Medicare (Managed Care) | Attending: Psychologist | Primary: Psychologist

## 2023-12-18 ENCOUNTER — Ambulatory Visit
Admit: 2023-12-18 | Discharge: 2023-12-19 | Payer: Medicare (Managed Care) | Attending: Psychologist | Primary: Psychologist

## 2023-12-18 DIAGNOSIS — F331 Major depressive disorder, recurrent, moderate: Principal | ICD-10-CM

## 2023-12-18 DIAGNOSIS — F4310 Post-traumatic stress disorder, unspecified: Principal | ICD-10-CM

## 2023-12-18 DIAGNOSIS — F410 Panic disorder [episodic paroxysmal anxiety] without agoraphobia: Principal | ICD-10-CM

## 2023-12-18 DIAGNOSIS — F411 Generalized anxiety disorder: Principal | ICD-10-CM

## 2023-12-18 NOTE — Unmapped (Signed)
 [facility]   Confidential Psychological Therapy Session      Patient Name: Caitlin Smith  Medical Record Number: [id number]  Date of Service: December 18, 2023  Attending Psychologist: Donette Furlong, PhD  CPT Procedure Codes: 81191 for 45 mins of face to face counseling    This visit was attempted via face to face with interactive technology using a HIPPA compliant audio/visual platform; patient unable to connect and so telephone was used. Her computer apparently broke last night. We reviewed confidentiality today. The patient was present in [place] , a state in which this provider is licensed and able to provide care (location and contact information confirmed), attended this visit alone, and consented to this virtual pain psychology visit.    REFERRING PHYSICIAN: Ronita Cohens, MD    CHIEF COMPLAINT AND REASON FOR VISIT: pain coping skills, CBT to address depression and anxiety in the setting of chronic pain    SUBJECTIVE / HISTORY OF PRESENT ILLNESS: Caitlin Smith is a very pleasant 68 y.o.  female from [place], [place] with multiple chronic pain complaints related to fibromyalgia and lupus who initially met with me in October 2016, at which time she was diagnosed with severe depression, PTSD, panic disorder, and generalized anxiety. The patient returns for a therapy session today. Last follow up with me was in 10/30/23.    Pt processes thoughts and feelings about health stressors, using a mindfulness approach.  Her PCP has changed and she does not yet know her new PCP.  She problem solves how to address her needs and how to link her new PCP with specialist care.  Patient also shares stressors and concerns related to family dynamics including the fact that she is often called upon to watch grandchildren when family members vacation but that they rarely invite her on vacation.  Separately, her son has on occasion invited her on vacation but she worries that his wife does not prefer it.  Spent extra time reviewing communication strategies to problem solve his scenario and for individuals in the family to understand thoughts and feelings.  Patient notes mood generally is better, more positive.  Denies any suicidal ideation or safety concerns.  Problem solves the transition to a new psychiatry resident.    OBJECTIVE / MENTAL STATUS:    Appearance:   Appears stated age and Clean/Neat   Motor:  No abnormal movements   Speech/Language:   Normal rate, volume, tone, fluency   Mood:  Depressed and Anxious   Affect:  euthymic   Thought process:  Logical, linear, clear, coherent, goal directed   Thought content:    Denies SI, HI, self harm, delusions, obsessions, paranoid ideation, or ideas of reference   Perceptual disturbances:    Denies auditory and visual hallucinations, behavior not concerning for response to internal stimuli   Orientation:  Oriented to person, place, time, and general circumstances   Attention:  Able to fully attend without fluctuations in consciousness   Concentration:  Able to fully concentrate and attend   Memory:  Immediate, short-term, long-term, and recall grossly intact    Fund of knowledge:   Consistent with level of education and development   Insight:    Fair   Judgment:   Intact   Impulse Control:  Intact     DIAGNOSTIC IMPRESSION:   Post Traumatic Stress Disorder (PTSD)  Generalized Anxiety Disorder (GAD)  Panic Disorder  Major Depressive Disorder, moderate, recurrent  Chronic pain syndrome  Fibromyalgia  Lupus  ASSESSMENT:   Caitlin Smith is a very pleasant 68 y.o.  female from [place], [place] with multiple chronic pain complaints related to lupus, arthritis, and fibromyalgia. She was previously seen in our clinic from 2012-2014, and reestablished care with Dr. Royston Cornea in August 2016. The patient has struggled with depression and anxiety, but is very motivated to participate in intensive, multidisciplinary treatment to address the connection between depression, anxiety and pain. She has a long-standing history of depression and anxiety, and has worked with outpatient psychiatrist and therapist over the years. She is currently established with [facility] psychiatry.  In general, depression, anxiety, pain coping and panic have all improved (until recent bout with pulmonary issues), but the patient evidences a seasonal affective pattern of mood changes typically her mood tends to be worse during the winter.  Encouraged early morning sunlight and getting outside and walking as tolerated, building cardiovascular and muscular strength and endurance over time. Also encouraged practice of daily behavioral relaxation exercises as well.     Most recently has had neurological symptoms, and saw her neurologist, has a brain MRI scheduled for tomorrow.     PLAN:   (1) Psychotherapy - Continue CBT. CBT will be used to address PTSD, MDD, Panic D/o, GAD, and chronic pain.     --Behavioral Activation - Encouraged behavioral activation.  Is trying. Walking in AM.  --Downloaded and uses Insight Timer, guided relaxation app, for nightly practice.  --Continues to utilize diaphragmatic breathing daily  --encouraged early morning sunlight / light therapy for possible SAD in the setting of chronic MDD    (2) Psychiatry - Pt currently established with [facility].  Is followed by Dr. Cheryn Coss (attending) but typically has appointments with a psychiatrist resident, Dr. Herbert Loh.    (3) Safety - Pt denies any current or recent SI or safety concerns. Knows to call 911 or go to her local ED. Also previously given [facility].

## 2023-12-19 MED FILL — MYCOPHENOLATE SODIUM 360 MG TABLET,DELAYED RELEASE: ORAL | 30 days supply | Qty: 120 | Fill #3

## 2023-12-21 ENCOUNTER — Other Ambulatory Visit: Payer: Self-pay | Admitting: Family Medicine

## 2023-12-21 DIAGNOSIS — E88819 Insulin resistance, unspecified: Secondary | ICD-10-CM

## 2023-12-21 NOTE — Unmapped (Signed)
 [facility]  OUTPATIENT PHYSICAL THERAPY  12/21/2023  Note Type: Treatment Note       Patient Name: Caitlin Smith  Date of Birth:November 05, 1955  Diagnosis:   Encounter Diagnoses   Name Primary?    Chronic pain syndrome     Fibromyalgia Yes    Spinal stenosis of lumbar region, unspecified whether neurogenic claudication present     Balance problem     Difficulty walking      Referring Provider: Sowles, Krichna Ferrari    Date of Onset of Impairment: 11/23/2022  Date PT Care Plan Established or Reviewed: 12/05/2023  Date PT Treatment Started: 11/23/2023     Plan of Care Effective Date: 11/23/2023 - 02/13/2024  Session Number:  3     ASSESSMENT & PLAN   Assessment  Assessment details:    Today: Caitlin Smith is a pleasant 68 y.o. female who presents for Physical Therapy Evaluation with Chronic low back pain in setting of DDD and multiple joint pain due to fibromyalgia ongoing > 1 year. Patient progressing well with introduction to lumbar paraspinals and hip strengthening without increase in symptoms. Patient reports lumbar mobility assists with reduction of symptoms but it begins shortly of cessation of exercises. The patient will benefit from skilled Physical Therapy intervention to address the moderate impairments listed below and to assist the patient in maximizing her functional independence and safe return to prior level of function.       Evaluation: Caitlin Smith is a pleasant 68 y.o. female who presents for Physical Therapy Evaluation with Chronic low back pain in setting of DDD and multiple joint pain due to fibromyalgia ongoing > 1 year. Primary impairments include difficulty with prolonged ambulation > 5 min, bending, lifting, twisting, sit to stand transfers, prolonged standing, and household chores. Clinical presentation today is consistent with low back pain due to DDD and multiple joint pain due to fibromyalgia. Patient has a history of chronic pain related to lupus and arthritis and is on rheumatological therapy The patient will benefit from skilled Physical Therapy intervention to address the moderate impairments listed below and to assist the patient in maximizing her functional independence and safe return to prior level of function.             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) (CMS-HCC) Chronic kidney disease, stage I Dysphonia Mixed urge and stress incontinence Myalgia and myositis Osteoarthrosis Proteinuria Sensorineural hearing loss VIN II (vulvar intraepithelial neoplasia II) Family history of breast cancer Pain medication agreement signed Episcleritis SLE (systemic lupus erythematosus) (CMS-HCC) S/P total knee replacement Asthma Postinflammatory pulmonary fibrosis (CMS-HCC)    Examination of Body Systems: musculoskeletal, activity/participation and communication    Clinical Presentation: stable    Clinical Decision Making: complex and moderate    Prognosis: good prognosis    Negative Prognosis Rationale: medical status/condition, chronicity of condition, severity of symptoms, ADL performance, endurance and strength.      Therapy Goals      Goals:        1. In 12 weeks the patient will demonstrate independent performance of HEP to maintain functional gains.   2. In 12 weeks: Pt. will have improved Lower extremity strength of 4+/5 in order to perform her house chores and ambulate in the community safely.  3. In 12 weeks the patient will  score a 10 point change on the ODI to demonstrate the MDC (0-100%; lower score indicates a lesser level of disability) and to indicate improved activity tolerance.   4. In 12 weeks the patient will complete an additional 3 STS transfers during the 30s chair stand test to met the MDC and to demonstrate increased functional leg strength.        Plan    Therapy options: will be seen for skilled physical therapy services    Planned therapy interventions: Balance Training, Education - Patient, Endurance Activites, Functional Mobility, Home Exercise Program, Education - Family/Caregiver, E-Stim, [employer], Civil engineer, contracting, Diaphragmatic/Pursed-lip Breathing, 97113-Aquatic Therapy, 97112-Neuromuscular Re-education, 97110-Therapeutic Exercises, 97116-Gait Training, 97140-Manual Therapy, 97530-Therapeutic Activities, 97535-Self-Care/Home Training, 97750-Physical Performance Test and 16109, 20561-Dry Needling 1-2, 3+ areas    DME Equipment: [employer].    Frequency: 1x week    Duration in weeks: 12    Education provided to: patient.    Education provided: HEP, Treatment options and plan, Symptom management, Safety education, Importance of Therapy, Anatomy, Body mechanics, Role of therapy in Rehabilitation, Posture, Community resources and Body awareness    Education results: verbalized good understanding, demonstrates understanding and needs reinforcement.    Communication/Consultation: N/A.      Total Session Time: 45    Treatment rendered today:      45        SUBJECTIVE         History of Present Condition     Date of onset:  11/23/2022 (Chronic)    History of Present Condition/Chief Complaint:    Associated Diagnoses    Chronic pain syndrome [G89.4]    Fibromyalgia [M79.7]    Spinal stenosis of lumbar region, unspecified whether neurogenic claudication present [M48.061]      Reason for Exam    Comments: Please assist with patient with using a TENs unit and work on myofascial release techniques for pain in the back        Evaluation: chronic pain localized to right knee and bilateral neck, shoulders and upper back myofascial pain and fibromyalgia, and lower back 2/2 lumbar facet arthropathy and DDD and moderate degree of lumbar central spinal canal stenosis. Patient has a history of chronic pain related to lupus and arthritis and is on rheumatological therapy.     worsened low back pain. The patient states that the pain feels like her pain goes all the way up her back into her shoulders. The patient confirms that trying to get up and standing makes her pain worse. The patient reports that she had benefit from her facet injections in the past. Per chart review, the patient last had facet injection in April 2022. Although, the patient reports that the neuropathic pain in her feet has improved since last visit, though her hands have not gotten better      Last injections on 1/27 and did not really help. Back is in a lot of pain. I used to walk for an hour. Haven't been able to walk much recently. Feel off balance as well. This year has had 5 falls. Can only stand for 5 min    Subjective:  Today: Pain has been about the same. The TENS is helping.   Pain:     Current pain rating:  7    At best pain rating:  6    At worst pain rating:  10  Location:  Low back    Quality:  Aching    Relieving factors:  Rest    Aggravating  factors:  Bending, lifting, performance of leg dominant activites, rising from sitting, stairs, standing and walking    Pain related Behaviors:  Avoidance    Progression:  No change    Red Flags:  None  Precautions/Equipment   Precautions:  Fall risk    Current Braces/Orthoses:  None    Equipment Currently Used:  None  Prior Functional Status     No physical limitations    Current Functional Status:    limited exercise, limited lifting, limited recreation, limited household activities and limited work capacity  Social Support:   Barriers to Learning:  No Barriers  Diagnostic Tests:     Comments:  Impression  Mild multilevel degenerative changes of the spine with facet arthropathy most prominent at L4-L5 and L5-S1. Demineralized osseous structures.      Treatments:     Previous treatment:  Corticosteroid injection and physical therapy    Current treatment: physical therapy    Patient Goals:     Patient/Family goals for therapy: Decreased pain, increased ROM, increased strength, independence with ADLs/IADLs, improved ambulation, improved standing tolerance and return to recreational activites      Hunger vital sign:  1. Within the past 12 months, we worried whether our food would run out before we got money to buy more. Never True  2. Within the past 12 months, the food we bought just didn't last, and we didn't have money to get more. Never True    If patient identifies with either of the above, patient was provided with local food resource guide and non perishables when possible.    OBJECTIVE     Outcome Measure: Revised Oswestry: Oswestry Score: 36 / 50 or 72 %     Posture/Observation:   Sitting: slouched and forward head  Correction of Posture: no effect  Standing: unremarkable  Gait: decreased gait speed  forward flexed posture    Lumbar Spine ROM  Motion No ROM Loss  (0%) Min ROM Loss  (1-33%) Mod ROM Loss  (34-65%) Major ROM Loss  (66-100%) Symptoms    Flexion   x  Most Pain   Extension   x  Pain   Sideglide Right   x  Pain   Sideglide Left   x  Pain     Special tests:   Right Left   Slump - -   SLR - -   Babinski - -   SIJ cluster: - -   Femoral nerve - -       NEUROLOGICAL:  MOTOR STRENGTH  Muscle Right Left   Hip Flexion 4-/5 4-/5   Knee extension 4-/5 4-/5   Ankle DF 4-/5 4-/5   Great toe extension 4-/5 4-/5   Ankle PF 4-/5 4-/5     SENSATION - LIGHT TOUCH  Dermatome Right Left   L2 Intact Intact   L3 Intact Intact   L4 Intact Intact   L5 Intact Intact   S1 Intact Intact       Palpation: TTP: Lumbar paraspinals L2-L5    TUG: 19.39 sec  30 sec STS: 6 reps with UE assistance      TREATMENT RENDERED     Interpreter Use: Not applicable    Therapeutic Exercise:  40 Minutes   Performed with direct PT demonstration, instruction, supervision, and guidance.   - Education on condition, prognosis, and PT POC  - HEP review as below   - LTR  - STS 24   - NuStep seat 10 Arms 12 L1 8  min  - Hooklying banded clamshell GTB  - hip adduction iso  - Hip banded marches  - Bridges       Manual Therapy: 0 Minutes   -         Next Visit Plan: Progress lumbar mobility and LE strengthening as tolerated      Total Treatment Time: 40 Minutes        Therapeutic Interventions Charges  $$ Therapeutic Exercise [mins]: 40                 I attest that I have reviewed the above information.  Signed: Jami Mcclintock, PT, DPT  12/21/2023 9:41 AM        I reviewed the no-show/attendance policy with the patient and caregiver(s). The patient is aware that they must call to cancel appointments more than 24 hours in advance. They are also aware that if they late cancel or no-show three times, we reserve the right to cancel their remaining appointments. This policy is in place to allow us  to best serve the needs of our caseload.    If patient returns to clinic with variance in plan of care, then it may be attributable to one or more of the following factors: preferred clinician availability, appointment time request availability, therapy pool appointment availability, major holiday with clinic closure, caregiver availability, patient transportation, conflicting medical appointment, inclement weather, and/or patient illness.    If patient does not return for follow up visit(s) related to this episode of care, this note will serve as their discharge note from Physical Therapy.

## 2023-12-25 ENCOUNTER — Emergency Department

## 2023-12-25 ENCOUNTER — Emergency Department
Admission: EM | Admit: 2023-12-25 | Discharge: 2023-12-25 | Disposition: A | Discharge status: Home / Self Care | Attending: Emergency Medicine | Admitting: Emergency Medicine

## 2023-12-25 ENCOUNTER — Other Ambulatory Visit: Payer: Self-pay

## 2023-12-25 DIAGNOSIS — R059 Cough, unspecified: Secondary | ICD-10-CM | POA: Diagnosis not present

## 2023-12-25 DIAGNOSIS — I10 Essential (primary) hypertension: Secondary | ICD-10-CM | POA: Insufficient documentation

## 2023-12-25 DIAGNOSIS — R0602 Shortness of breath: Secondary | ICD-10-CM | POA: Insufficient documentation

## 2023-12-25 DIAGNOSIS — R079 Chest pain, unspecified: Secondary | ICD-10-CM | POA: Insufficient documentation

## 2023-12-25 DIAGNOSIS — E119 Type 2 diabetes mellitus without complications: Secondary | ICD-10-CM | POA: Insufficient documentation

## 2023-12-25 LAB — D-DIMER, QUANTITATIVE: D-Dimer, Quant: 0.33 ug{FEU}/mL (ref 0.00–0.50)

## 2023-12-25 LAB — BASIC METABOLIC PANEL WITH GFR
Anion gap: 9 (ref 5–15)
BUN: 18 mg/dL (ref 8–23)
CO2: 26 mmol/L (ref 22–32)
Calcium: 8.9 mg/dL (ref 8.9–10.3)
Chloride: 105 mmol/L (ref 98–111)
Creatinine, Ser: 1.01 mg/dL — ABNORMAL HIGH (ref 0.44–1.00)
GFR, Estimated: >60 mL/min (ref >60)
Glucose, Bld: 93 mg/dL (ref 70–99)
Potassium: 3.9 mmol/L (ref 3.5–5.1)
Sodium: 140 mmol/L (ref 135–145)

## 2023-12-25 LAB — CBC
HCT: 39.9 % (ref 36.0–46.0)
Hemoglobin: 12.8 g/dL (ref 12.0–15.0)
MCH: 27.9 pg (ref 26.0–34.0)
MCHC: 32.1 g/dL (ref 30.0–36.0)
MCV: 87.1 fL (ref 80.0–100.0)
Platelets: 295 10*3/uL (ref 150–400)
RBC: 4.58 MIL/uL (ref 3.87–5.11)
RDW: 13.3 % (ref 11.5–15.5)
WBC: 3.6 10*3/uL — ABNORMAL LOW (ref 4.0–10.5)
nRBC: 0 % (ref 0.0–0.2)

## 2023-12-25 LAB — TROPONIN I (HIGH SENSITIVITY)
Troponin I (High Sensitivity): 4 ng/L (ref <18)
Troponin I (High Sensitivity): 5 ng/L (ref <18)

## 2023-12-25 NOTE — ED Notes (Signed)
 Lt blue top sent to lab

## 2023-12-25 NOTE — ED Provider Notes (Signed)
 [facility] Provider Note    Event Date/Time   First MD Initiated Contact with Patient 12/25/23 [phone]     (approximate)   History   Chest Pain   HPI Nicole Ballard is a 68 y.o. female with history of lupus, bipolar 1, fibromyalgia, HTN, DM2, HLD presenting today for chest pain.  Patient states over the past week she has had intermittent chest pain symptoms associate with shortness of breath.  Feels a sharper pain midline in her chest which sometimes radiates to her back.  Symptoms are very brief and currently does not have the symptoms at all at this time.  States she does have some chronic shortness of breath symptoms related to chronic lung disease.  Notes a dry cough.  Denies fever, congestion, abdominal pain, nausea, vomiting, leg pain, leg swelling.     Physical Exam   Triage Vital Signs: ED Triage Vitals  Encounter Vitals Group     BP 12/25/23 0842 (!) 144/84     Systolic BP Percentile --      Diastolic BP Percentile --      Pulse Rate 12/25/23 0842 68     Resp 12/25/23 0842 20     Temp 12/25/23 0842 98.5 F (36.9 C)     Temp src --      SpO2 12/25/23 0842 100 %     Weight 12/25/23 0840 181 lb 10.5 oz (82.4 kg)     Height 12/25/23 0910 5\' 7"  (1.702 m)     Head Circumference --      Peak Flow --      Pain Score 12/25/23 0839 0     Pain Loc --      Pain Education --      Exclude from Growth Chart --     Most recent vital signs: Vitals:   12/25/23 1006 12/25/23 1008  BP: (!) 141/94   Pulse:  (!) 57  Resp: (!) 21 17  Temp:    SpO2:  100%   Physical Exam: I have reviewed the vital signs and nursing notes. General: Awake, alert, no acute distress.  Nontoxic appearing. Head:  Atraumatic, normocephalic.   ENT:  EOM intact, PERRL. Oral mucosa is pink and moist with no lesions. Neck: Neck is supple with full range of motion, No meningeal signs. Cardiovascular:  RRR, No murmurs. Peripheral pulses palpable and equal  bilaterally. Respiratory:  Symmetrical chest wall expansion.  No rhonchi, rales, or wheezes.  Good air movement throughout.  No use of accessory muscles.   Musculoskeletal:  No cyanosis or edema. Moving extremities with full ROM Abdomen:  Soft, nontender, nondistended. Neuro:  GCS 15, moving all four extremities, interacting appropriately. Speech clear. Psych:  Calm, appropriate.   Skin:  Warm, dry, no rash.    ED Results / Procedures / Treatments   Labs (all labs ordered are listed, but only abnormal results are displayed) Labs Reviewed  BASIC METABOLIC PANEL WITH GFR - Abnormal; Notable for the following components:      Result Value   Creatinine, Ser 1.01 (*)    All other components within normal limits  CBC - Abnormal; Notable for the following components:   WBC 3.6 (*)    All other components within normal limits  D-DIMER, QUANTITATIVE  TROPONIN I (HIGH SENSITIVITY)  TROPONIN I (HIGH SENSITIVITY)     EKG My EKG interpretation: Rate of 70, normal sinus rhythm, normal axis, normal intervals.  No acute ST elevations or depressions  RADIOLOGY Independently interpreted chest x-ray with no acute pathology   PROCEDURES:  Critical Care performed: No  Procedures   MEDICATIONS ORDERED IN ED: Medications - No data to display   IMPRESSION / MDM / ASSESSMENT AND PLAN / ED COURSE  I reviewed the triage vital signs and the nursing notes.                              Differential diagnosis includes, but is not limited to, pneumonia, pneumothorax, ACS, chronic lung disease, PE  Patient's presentation is most consistent with acute complicated illness / injury requiring diagnostic workup.  Patient is a 68 year old female presenting today for intermittent chest pain and shortness of breath symptoms over the past week.  Currently she is asymptomatic with unremarkable physical exam and vital signs stable.  Does describe some sharper pain when she takes a deep breath although  that is not currently present.  Will get dimer for further evaluation given she is low risk elsewise.  EKG with no ischemic findings.  Troponin negative x 2.  CBC and BMP largely unremarkable.  Dimer negative.  Chest x-ray with no acute cardiopulmonary abnormalities.  Reassessed patient and still not having any symptoms since arrival here.  Does note that she had missed several doses of her reflux medication which could potentially be causing her symptoms today.  Otherwise safe for discharge and follow-up with her PCP as needed.  Given strict return precautions.  The patient is on the cardiac monitor to evaluate for evidence of arrhythmia and/or significant heart rate changes. Clinical Course as of 12/25/23 1225  Mon Dec 25, 2023  1223 Reassessed.  No ongoing symptoms [DW]    Clinical Course User Index [DW] Kandee Orion, MD     FINAL CLINICAL IMPRESSION(S) / ED DIAGNOSES   Final diagnoses:  Chest pain, unspecified type     Rx / DC Orders   ED Discharge Orders     None        Note:  This document was prepared using Dragon voice recognition software and may include unintentional dictation errors.   Kandee Orion, MD 12/25/23 1225

## 2023-12-25 NOTE — ED Triage Notes (Signed)
 C/o episodic chest pain and SOB x 1 week.states episodes worsening. Hx of lupus.  No aggravating or alleviating factors.

## 2023-12-25 NOTE — Discharge Instructions (Addendum)
 Your laboratory workup was reassuring here today.  Heart markers were negative x 2.  Negative blood markers for signs of blood clots.  No evidence of pneumonia on your imaging.  Please follow-up with your primary care provider and cardiology team as needed.

## 2023-12-27 ENCOUNTER — Encounter: Admit: 2023-12-27 | Payer: Medicare (Managed Care)

## 2023-12-27 DIAGNOSIS — R490 Dysphonia: Principal | ICD-10-CM

## 2023-12-27 NOTE — Unmapped (Signed)
 [facility] SPEECH [place] [address]  OUTPATIENT SPEECH PATHOLOGY  12/27/2023      Patient Name: Caitlin Smith  Date of Birth:June 19, 1956  Session Number: 4  Diagnosis:   Encounter Diagnosis   Name Primary?    Dysphonia Yes           Date of Evaluation: 10/10/23  Date of Symptom Onset: 10/09/98  Referred by: Eather Golder, MD         Dates of Certification: expires 02/07/2024    Chief Complaint: chronic cough; dysphonia    Note Type: Treatment Note    ASSESSMENT:     Next Visit Plan: abdominal support of phonation, resonant voice, balanced phonation, conversation training    OBJECTIVE:  Today's session focused on a review of voice therapy concepts and exercises targeting abdominal support of phonation, resonant voice, and balanced phonation. Patient was ~85% accurate for adequate breath support with forward resonance on /m, n, v, z, w/ at the word and phrase levels given max fading to min cues to use a forward resonance and model from clinician. ~70% accurate for the aforementioned targets at the level of conversation, independently.     Stimulability: Pt was very stimulable  Treatment Recommendations: Continue Treatment    PLAN:  SLP Follow-up / Frequency: 1x week for Planned Treatment Duration : 1 session per week-every other week x9 over 3 months    Planned Interventions: Conversational Coaching, Cough therapy, Diaphragmatic breathing/respiratory retraining, Resonant Voice Therapy Semi-occluded Vocal Tract Exercises, Financial controller, Functional Phonation Tasks    Prognosis:  Good    Negative Prognosis Rationale: Time post onset, Severity of deficits       Positive Prognosis Rationale: Motivation, Response to trial treatment      Goals:      STG 1: Breathing: Pt. will demonstrate abdominal breathing for support of phonation with 80% accuracy given minimal cues    STG 2: VCD/Cough: Pt. will minimize or eliminate cough and/or Vocal Cord Dysfunction by implementing learned strategies with 100% accuracy given minimal cues.    STG 3: Resonance: Pt will balance oral/nasal resonance to maximize vocal output with minimal effort/laryngeal hyperfunction with 80% accuracy given minimal cues    STG 4: Strength/balance: Pt will use balance of vocal fold adduction and airflow to produce voice in structured exercises and spontaneous speech tasks with 80% accuracy given minimal cues    STG 5: Inflection: Pt will vary pitch and/or loudness to demonstrate phonatory flexibility and laryngeal control without laryngeal hyperfunction in sustained phonation exercises and conversational speech with 80% accuracy given minimal cues    Time Frame: 3  Duration: months    LTG #1: At the completion of Tx, Pt will demonstrate ability to perform voice therapy techniques independently to maintain optimal laryngeal function (+/-)    Time Frame: 3  Duration: months       SUBJECTIVE:  Patient returns with report that her cough and voice related concerns have continued to improve since our last meeting.   Pain?: No      Precautions: None    Education Provided: Patient, SLP Plan of Care, Strategies to maximize voicing, Compensatory VCD/Cough Strategies    Response to Education: Understanding demonstrated      Session Duration : 20       Today's Charges (noted here with $$):  SLP Treatment Charges  $$ 92507-Treatment S/L/V - individual [mins]: 20                   I attest  that I have reviewed the above information.  Signed: Mammie Sears, SLP  12/27/2023 3:02 PM        The patient reports they are physically located in [place]  and is currently: at home. I conducted a audio/video visit. I spent  46m 53s on the video call with the patient. I spent an additional 5 minutes on pre- and post-visit activities on the date of service .

## 2023-12-28 ENCOUNTER — Encounter: Admitting: Family Medicine

## 2023-12-28 NOTE — Unmapped (Signed)
 [facility] PT [facility]  OUTPATIENT PHYSICAL THERAPY  12/28/2023  Note Type: Treatment Note       Patient Name: Caitlin Smith  Date of Birth:February 04, 1956  Diagnosis:   Encounter Diagnoses   Name Primary?    Chronic pain syndrome Yes    Fibromyalgia     Spinal stenosis of lumbar region, unspecified whether neurogenic claudication present     Balance problem     Difficulty walking      Referring Provider: Sowles, Krichna Ferrari    Date of Onset of Impairment: 11/23/2022  Date PT Care Plan Established or Reviewed: 12/05/2023  Date PT Treatment Started: 11/23/2023     Plan of Care Effective Date: 11/23/2023 - 02/13/2024  Session Number:  4     ASSESSMENT & PLAN   Assessment  Assessment details:    Today: Caitlin Smith is a pleasant 68 y.o. female who presents for Physical Therapy Evaluation with Chronic low back pain in setting of DDD and multiple joint pain due to fibromyalgia ongoing > 1 year. Patient challenged with tandem balance with LLE in rear positioning with increased LOB but able to regain with UE assist. Patient required consistent verbal cueing to correct weight shift towards LLE with STS. The patient will benefit from skilled Physical Therapy intervention to address the moderate impairments listed below and to assist the patient in maximizing her functional independence and safe return to prior level of function.       Evaluation: Caitlin Smith is a pleasant 68 y.o. female who presents for Physical Therapy Evaluation with Chronic low back pain in setting of DDD and multiple joint pain due to fibromyalgia ongoing > 1 year. Primary impairments include difficulty with prolonged ambulation > 5 min, bending, lifting, twisting, sit to stand transfers, prolonged standing, and household chores. Clinical presentation today is consistent with low back pain due to DDD and multiple joint pain due to fibromyalgia. Patient has a history of chronic pain related to lupus and arthritis and is on rheumatological therapy The patient will benefit from skilled Physical Therapy intervention to address the moderate impairments listed below and to assist the patient in maximizing her functional independence and safe return to prior level of function.             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) (CMS-HCC) Chronic kidney disease, stage I Dysphonia Mixed urge and stress incontinence Myalgia and myositis Osteoarthrosis Proteinuria Sensorineural hearing loss VIN II (vulvar intraepithelial neoplasia II) Family history of breast cancer Pain medication agreement signed Episcleritis SLE (systemic lupus erythematosus) (CMS-HCC) S/P total knee replacement Asthma Postinflammatory pulmonary fibrosis (CMS-HCC)    Examination of Body Systems: musculoskeletal, activity/participation and communication    Clinical Presentation: stable    Clinical Decision Making: complex and moderate    Prognosis: good prognosis    Negative Prognosis Rationale: medical status/condition, chronicity of condition, severity of symptoms, ADL performance, endurance and strength.      Therapy Goals      Goals:        1. In 12 weeks the patient will demonstrate independent performance of HEP to maintain functional gains.   2. In 12 weeks: Pt. will have improved Lower extremity strength of 4+/5 in order to perform her house chores and ambulate in the community safely.  3. In 12 weeks the patient  will score a 10 point change on the ODI to demonstrate the MDC (0-100%; lower score indicates a lesser level of disability) and to indicate improved activity tolerance.   4. In 12 weeks the patient will complete an additional 3 STS transfers during the 30s chair stand test to met the MDC and to demonstrate increased functional leg strength.        Plan    Therapy options: will be seen for skilled physical therapy services    Planned therapy interventions: Balance Training, Education - Patient, Endurance Activites, Functional Mobility, Home Exercise Program, Education - Family/Caregiver, E-Stim, [employer], Civil engineer, contracting, Diaphragmatic/Pursed-lip Breathing, 97113-Aquatic Therapy, 97112-Neuromuscular Re-education, 97110-Therapeutic Exercises, 97116-Gait Training, 97140-Manual Therapy, 97530-Therapeutic Activities, 97535-Self-Care/Home Training, 97750-Physical Performance Test and 16109, 20561-Dry Needling 1-2, 3+ areas    DME Equipment: [employer].    Frequency: 1x week    Duration in weeks: 12    Education provided to: patient.    Education provided: HEP, Treatment options and plan, Symptom management, Safety education, Importance of Therapy, Anatomy, Body mechanics, Role of therapy in Rehabilitation, Posture, Community resources and Body awareness    Education results: verbalized good understanding, demonstrates understanding and needs reinforcement.    Communication/Consultation: N/A.      Total Session Time: 45    Treatment rendered today:      45        SUBJECTIVE         History of Present Condition     Date of onset:  11/23/2022 (Chronic)    History of Present Condition/Chief Complaint:    Associated Diagnoses    Chronic pain syndrome [G89.4]    Fibromyalgia [M79.7]    Spinal stenosis of lumbar region, unspecified whether neurogenic claudication present [M48.061]      Reason for Exam    Comments: Please assist with patient with using a TENs unit and work on myofascial release techniques for pain in the back        Evaluation: chronic pain localized to right knee and bilateral neck, shoulders and upper back myofascial pain and fibromyalgia, and lower back 2/2 lumbar facet arthropathy and DDD and moderate degree of lumbar central spinal canal stenosis. Patient has a history of chronic pain related to lupus and arthritis and is on rheumatological therapy.     worsened low back pain. The patient states that the pain feels like her pain goes all the way up her back into her shoulders. The patient confirms that trying to get up and standing makes her pain worse. The patient reports that she had benefit from her facet injections in the past. Per chart review, the patient last had facet injection in April 2022. Although, the patient reports that the neuropathic pain in her feet has improved since last visit, though her hands have not gotten better      Last injections on 1/27 and did not really help. Back is in a lot of pain. I used to walk for an hour. Haven't been able to walk much recently. Feel off balance as well. This year has had 5 falls. Can only stand for 5 min    Subjective:  Today: Still walking okay. Balance has been feeling good, I still sway when I walk.   Pain:     Current pain rating:  2    At best pain rating:  0    At worst pain rating:  7  Location:  Low back    Quality:  Aching    Relieving factors:  Rest    Aggravating factors:  Bending, lifting, performance of leg dominant activites, rising from sitting, stairs, standing and walking    Pain related Behaviors:  Avoidance    Progression:  No change    Red Flags:  None  Precautions/Equipment   Precautions:  Fall risk    Current Braces/Orthoses:  None    Equipment Currently Used:  None  Prior Functional Status     No physical limitations    Current Functional Status:    limited exercise, limited lifting, limited recreation, limited household activities and limited work capacity  Social Support:   Barriers to Learning:  No Barriers  Diagnostic Tests:     Comments:  Impression  Mild multilevel degenerative changes of the spine with facet arthropathy most prominent at L4-L5 and L5-S1. Demineralized osseous structures.      Treatments:     Previous treatment:  Corticosteroid injection and physical therapy    Current treatment: physical therapy    Patient Goals:     Patient/Family goals for therapy:  Decreased pain, increased ROM, increased strength, independence with ADLs/IADLs, improved ambulation, improved standing tolerance and return to recreational activites      Hunger vital sign:  1. Within the past 12 months, we worried whether our food would run out before we got money to buy more. Never True  2. Within the past 12 months, the food we bought just didn't last, and we didn't have money to get more. Never True    If patient identifies with either of the above, patient was provided with local food resource guide and non perishables when possible.    OBJECTIVE     Outcome Measure: Revised Oswestry: Oswestry Score: 36 / 50 or 72 %     Posture/Observation:   Sitting: slouched and forward head  Correction of Posture: no effect  Standing: unremarkable  Gait: decreased gait speed  forward flexed posture    Lumbar Spine ROM  Motion No ROM Loss  (0%) Min ROM Loss  (1-33%) Mod ROM Loss  (34-65%) Major ROM Loss  (66-100%) Symptoms    Flexion   x  Most Pain   Extension   x  Pain   Sideglide Right   x  Pain   Sideglide Left   x  Pain     Special tests:   Right Left   Slump - -   SLR - -   Babinski - -   SIJ cluster: - -   Femoral nerve - -       NEUROLOGICAL:  MOTOR STRENGTH  Muscle Right Left   Hip Flexion 4-/5 4-/5   Knee extension 4-/5 4-/5   Ankle DF 4-/5 4-/5   Great toe extension 4-/5 4-/5   Ankle PF 4-/5 4-/5     SENSATION - LIGHT TOUCH  Dermatome Right Left   L2 Intact Intact   L3 Intact Intact   L4 Intact Intact   L5 Intact Intact   S1 Intact Intact       Palpation: TTP: Lumbar paraspinals L2-L5    TUG: 19.39 sec  30 sec STS: 6 reps with UE assistance      TREATMENT RENDERED     Interpreter Use: Not applicable    Therapeutic Exercise:  40 Minutes   Performed with direct PT demonstration, instruction, supervision, and guidance.   - Education on condition, prognosis, and PT POC  - HEP review as below   - LTR  - STS 24 2000g ball   -  NuStep seat 10 Arms 12 L1-L3 8 min  - Hooklying banded clamshell GTB  - hip adduction iso  - Step over hurdle  - tandem balance  - Seated twists 2000g    Deferred:  - Hip banded marches  - Bridges       Manual Therapy: 0 Minutes   -         Next Visit Plan: Progress lumbar mobility and LE strengthening as tolerated      Total Treatment Time: 40 Minutes        Therapeutic Interventions Charges  $$ Therapeutic Exercise [mins]: 40                 I attest that I have reviewed the above information.  Signed: Jami Mcclintock, PT, DPT  12/28/2023 9:44 AM        I reviewed the no-show/attendance policy with the patient and caregiver(s). The patient is aware that they must call to cancel appointments more than 24 hours in advance. They are also aware that if they late cancel or no-show three times, we reserve the right to cancel their remaining appointments. This policy is in place to allow us  to best serve the needs of our caseload.    If patient returns to clinic with variance in plan of care, then it may be attributable to one or more of the following factors: preferred clinician availability, appointment time request availability, therapy pool appointment availability, major holiday with clinic closure, caregiver availability, patient transportation, conflicting medical appointment, inclement weather, and/or patient illness.    If patient does not return for follow up visit(s) related to this episode of care, this note will serve as their discharge note from Physical Therapy.

## 2024-01-01 MED ORDER — CLONAZEPAM 0.5 MG TABLET
ORAL_TABLET | Freq: Three times a day (TID) | ORAL | 1 refills | 20.00000 days | Status: CP | PRN
Start: 2024-01-01 — End: ?

## 2024-01-02 ENCOUNTER — Ambulatory Visit
Admit: 2024-01-02 | Discharge: 2024-01-03 | Payer: Medicare (Managed Care) | Attending: Student in an Organized Health Care Education/Training Program | Primary: Student in an Organized Health Care Education/Training Program

## 2024-01-02 DIAGNOSIS — R2689 Other abnormalities of gait and mobility: Principal | ICD-10-CM

## 2024-01-02 DIAGNOSIS — R449 Unspecified symptoms and signs involving general sensations and perceptions: Principal | ICD-10-CM

## 2024-01-02 DIAGNOSIS — G44229 Chronic tension-type headache, not intractable: Principal | ICD-10-CM
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
## 2024-01-02 NOTE — Unmapped (Addendum)
 You were seen in the outpatient neurology clinic today for tension type headaches and neuropathy.    Plan:  - continue cyclobenzaprine  5 mg every night. If headaches worsen, could consider slight increase in dosage  - continue to be as physically active as possible and drink plenty of water  - plan for EMG to look at the health of the nerves and muscles  - talk to your pain management specialist about your nerve pain. Could consider increasing pregabalin  (Lyrica ) if deemed appropriate.

## 2024-01-04 NOTE — Unmapped (Signed)
 [facility] [place]  OUTPATIENT PHYSICAL THERAPY  01/04/2024  Note Type: Treatment Note       Patient Name: Caitlin Smith  Date of Birth:[birth year]  Diagnosis:   Encounter Diagnoses   Name Primary?    Chronic pain syndrome Yes    Fibromyalgia     Spinal stenosis of lumbar region, unspecified whether neurogenic claudication present     Balance problem     Difficulty walking        Referring Provider: Referring, Unknown Per *    Date of Onset of Impairment: 11/23/2022  Date PT Care Plan Established or Reviewed: 12/05/2023  Date PT Treatment Started: 11/23/2023     Plan of Care Effective Date: 11/23/2023 - 02/13/2024  Session Number:  5     ASSESSMENT & PLAN   Assessment  Assessment details:    Today: Caitlin Smith is a pleasant 68 y.o. female who presents for Physical Therapy Evaluation with Chronic low back pain in setting of DDD and multiple joint pain due to fibromyalgia ongoing > 1 year. Patient challenged with increase in resistance with NuStep and STS due to fatigue, able to complete with adequate rest breaks. Patient able to now complete STS without UE assistance at decreased table height without LOB.  The patient will benefit from skilled Physical Therapy intervention to address the moderate impairments listed below and to assist the patient in maximizing her functional independence and safe return to prior level of function.       Evaluation: Caitlin Smith is a pleasant 68 y.o. female who presents for Physical Therapy Evaluation with Chronic low back pain in setting of DDD and multiple joint pain due to fibromyalgia ongoing > 1 year. Primary impairments include difficulty with prolonged ambulation > 5 min, bending, lifting, twisting, sit to stand transfers, prolonged standing, and household chores. Clinical presentation today is consistent with low back pain due to DDD and multiple joint pain due to fibromyalgia. Patient has a history of chronic pain related to lupus and arthritis and is on rheumatological therapy The patient will benefit from skilled Physical Therapy intervention to address the moderate impairments listed below and to assist the patient in maximizing her functional independence and safe return to prior level of function.             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) (CMS-HCC) Chronic kidney disease, stage I Dysphonia Mixed urge and stress incontinence Myalgia and myositis Osteoarthrosis Proteinuria Sensorineural hearing loss VIN II (vulvar intraepithelial neoplasia II) Family history of breast cancer Pain medication agreement signed Episcleritis SLE (systemic lupus erythematosus) (CMS-HCC) S/P total knee replacement Asthma Postinflammatory pulmonary fibrosis (CMS-HCC)    Examination of Body Systems: musculoskeletal, activity/participation and communication    Clinical Presentation: stable    Clinical Decision Making: complex and moderate    Prognosis: good prognosis    Negative Prognosis Rationale: medical status/condition, chronicity of condition, severity of symptoms, ADL performance, endurance and strength.      Therapy Goals      Goals:        1. In 12 weeks the patient will demonstrate independent performance of HEP to maintain functional gains.   2. In 12 weeks: Pt. will have improved Lower extremity strength of 4+/5 in order to perform her house chores and ambulate in the community safely.  3. In 12 weeks the patient will score a 10 point change on the ODI to demonstrate the MDC (0-100%; lower score indicates a lesser level of disability) and to indicate improved activity tolerance.   4. In 12 weeks the patient will complete an additional 3 STS transfers during the 30s chair stand test to met the MDC and to demonstrate increased functional leg strength.        Plan Therapy options: will be seen for skilled physical therapy services    Planned therapy interventions: Balance Training, Education - Patient, Endurance Activites, Functional Mobility, Home Exercise Program, Education - Family/Caregiver, E-Stim, [employer], Civil engineer, contracting, Diaphragmatic/Pursed-lip Breathing, 97113-Aquatic Therapy, 97112-Neuromuscular Re-education, 97110-Therapeutic Exercises, 97116-Gait Training, 97140-Manual Therapy, 97530-Therapeutic Activities, 97535-Self-Care/Home Training, 97750-Physical Performance Test and 14782, 20561-Dry Needling 1-2, 3+ areas    DME Equipment: [employer].    Frequency: 1x week    Duration in weeks: 12    Education provided to: patient.    Education provided: HEP, Treatment options and plan, Symptom management, Safety education, Importance of Therapy, Anatomy, Body mechanics, Role of therapy in Rehabilitation, Posture, Community resources and Body awareness    Education results: verbalized good understanding, demonstrates understanding and needs reinforcement.    Communication/Consultation: N/A.      Total Session Time: 45    Treatment rendered today:      45        SUBJECTIVE         History of Present Condition     Date of onset:  11/23/2022 (Chronic)    History of Present Condition/Chief Complaint:    Associated Diagnoses    Chronic pain syndrome [G89.4]    Fibromyalgia [M79.7]    Spinal stenosis of lumbar region, unspecified whether neurogenic claudication present [M48.061]      Reason for Exam    Comments: Please assist with patient with using a TENs unit and work on myofascial release techniques for pain in the back        Evaluation: chronic pain localized to right knee and bilateral neck, shoulders and upper back myofascial pain and fibromyalgia, and lower back 2/2 lumbar facet arthropathy and DDD and moderate degree of lumbar central spinal canal stenosis. Patient has a history of chronic pain related to lupus and arthritis and is on rheumatological therapy. worsened low back pain. The patient states that the pain feels like her pain goes all the way up her back into her shoulders. The patient confirms that trying to get up and standing makes her pain worse. The patient reports that she had benefit from her facet injections in the past. Per chart review, the patient last had facet injection in April 2022. Although, the patient reports that the neuropathic pain in her feet has improved since last visit, though her hands have not gotten better      Last injections on 1/27 and did not really help. Back is in a lot of pain. I used to walk for an hour. Haven't been able to walk much recently. Feel off balance as well. This year has had 5 falls. Can only stand for 5 min    Subjective:  Today:  Feel like I'm doing a lot better this week. Have not tripped or fallen this week.   Pain:     Current pain rating:  2    At best pain rating:  0    At worst pain rating:  7  Location:  Low back    Quality:  Aching    Relieving  factors:  Rest    Aggravating factors:  Bending, lifting, performance of leg dominant activites, rising from sitting, stairs, standing and walking    Pain related Behaviors:  Avoidance    Progression:  No change    Red Flags:  None  Precautions/Equipment   Precautions:  Fall risk    Current Braces/Orthoses:  None    Equipment Currently Used:  None  Prior Functional Status     No physical limitations    Current Functional Status:    limited exercise, limited lifting, limited recreation, limited household activities and limited work capacity  Social Support:   Barriers to Learning:  No Barriers  Diagnostic Tests:     Comments:  Impression  Mild multilevel degenerative changes of the spine with facet arthropathy most prominent at L4-L5 and L5-S1. Demineralized osseous structures.      Treatments:     Previous treatment:  Corticosteroid injection and physical therapy    Current treatment: physical therapy    Patient Goals:     Patient/Family goals for therapy: Decreased pain, increased ROM, increased strength, independence with ADLs/IADLs, improved ambulation, improved standing tolerance and return to recreational activites      Hunger vital sign:  1. Within the past 12 months, we worried whether our food would run out before we got money to buy more. Never True  2. Within the past 12 months, the food we bought just didn't last, and we didn't have money to get more. Never True    If patient identifies with either of the above, patient was provided with local food resource guide and non perishables when possible.    OBJECTIVE     Outcome Measure: Revised Oswestry: Oswestry Score: 36 / 50 or 72 %     Posture/Observation:   Sitting: slouched and forward head  Correction of Posture: no effect  Standing: unremarkable  Gait: decreased gait speed  forward flexed posture    Lumbar Spine ROM  Motion No ROM Loss  (0%) Min ROM Loss  (1-33%) Mod ROM Loss  (34-65%) Major ROM Loss  (66-100%) Symptoms    Flexion   x  Most Pain   Extension   x  Pain   Sideglide Right   x  Pain   Sideglide Left   x  Pain     Special tests:   Right Left   Slump - -   SLR - -   Babinski - -   SIJ cluster: - -   Femoral nerve - -       NEUROLOGICAL:  MOTOR STRENGTH  Muscle Right Left   Hip Flexion 4-/5 4-/5   Knee extension 4-/5 4-/5   Ankle DF 4-/5 4-/5   Great toe extension 4-/5 4-/5   Ankle PF 4-/5 4-/5     SENSATION - LIGHT TOUCH  Dermatome Right Left   L2 Intact Intact   L3 Intact Intact   L4 Intact Intact   L5 Intact Intact   S1 Intact Intact       Palpation: TTP: Lumbar paraspinals L2-L5    TUG: 19.39 sec  30 sec STS: 6 reps with UE assistance      TREATMENT RENDERED     Interpreter Use: Not applicable    Therapeutic Exercise:  40 Minutes   Performed with direct PT demonstration, instruction, supervision, and guidance.   - Education on condition, prognosis, and PT POC  - HEP review as below   - LTR  - STS 22 3000g ball   -  NuStep seat 10 Arms 12 L4 8 min  - Seated lumbar flexion slides on table  - Seated twists 3000g    Deferred:  - Hip banded marches  - Bridges   - Hooklying banded clamshell GTB  - hip adduction iso  - Step over hurdle  - tandem balance      Manual Therapy: 0 Minutes   -         Next Visit Plan: Progress lumbar mobility and LE strengthening as tolerated      Total Treatment Time: 40 Minutes        Therapeutic Interventions Charges  $$ Therapeutic Exercise [mins]: 40                 I attest that I have reviewed the above information.  Signed: Jami Mcclintock, PT, DPT  01/04/2024 8:16 AM        I reviewed the no-show/attendance policy with the patient and caregiver(s). The patient is aware that they must call to cancel appointments more than 24 hours in advance. They are also aware that if they late cancel or no-show three times, we reserve the right to cancel their remaining appointments. This policy is in place to allow us  to best serve the needs of our caseload.    If patient returns to clinic with variance in plan of care, then it may be attributable to one or more of the following factors: preferred clinician availability, appointment time request availability, therapy pool appointment availability, major holiday with clinic closure, caregiver availability, patient transportation, conflicting medical appointment, inclement weather, and/or patient illness.    If patient does not return for follow up visit(s) related to this episode of care, this note will serve as their discharge note from Physical Therapy.

## 2024-01-08 ENCOUNTER — Other Ambulatory Visit: Payer: Self-pay | Admitting: Family Medicine

## 2024-01-08 DIAGNOSIS — E88819 Insulin resistance, unspecified: Secondary | ICD-10-CM

## 2024-01-08 DIAGNOSIS — I10 Essential (primary) hypertension: Secondary | ICD-10-CM

## 2024-01-09 ENCOUNTER — Encounter: Payer: Self-pay | Admitting: Family Medicine

## 2024-01-09 ENCOUNTER — Telehealth: Payer: Self-pay | Admitting: Family Medicine

## 2024-01-09 ENCOUNTER — Ambulatory Visit: Payer: Self-pay | Admitting: Family Medicine

## 2024-01-09 VITALS — BP 116/72 | HR 84 | Resp 16 | Ht 67.0 in | Wt 189.8 lb

## 2024-01-09 DIAGNOSIS — K219 Gastro-esophageal reflux disease without esophagitis: Secondary | ICD-10-CM

## 2024-01-09 DIAGNOSIS — M3213 Lung involvement in systemic lupus erythematosus: Secondary | ICD-10-CM

## 2024-01-09 DIAGNOSIS — J4550 Severe persistent asthma, uncomplicated: Secondary | ICD-10-CM

## 2024-01-09 DIAGNOSIS — E236 Other disorders of pituitary gland: Secondary | ICD-10-CM

## 2024-01-09 DIAGNOSIS — D849 Immunodeficiency, unspecified: Secondary | ICD-10-CM

## 2024-01-09 DIAGNOSIS — I700 Atherosclerosis of aorta: Secondary | ICD-10-CM | POA: Diagnosis not present

## 2024-01-09 DIAGNOSIS — R29818 Other symptoms and signs involving the nervous system: Secondary | ICD-10-CM

## 2024-01-09 DIAGNOSIS — E785 Hyperlipidemia, unspecified: Secondary | ICD-10-CM

## 2024-01-09 DIAGNOSIS — J8410 Pulmonary fibrosis, unspecified: Secondary | ICD-10-CM

## 2024-01-09 DIAGNOSIS — J3089 Other allergic rhinitis: Secondary | ICD-10-CM

## 2024-01-09 DIAGNOSIS — F339 Major depressive disorder, recurrent, unspecified: Secondary | ICD-10-CM

## 2024-01-09 DIAGNOSIS — R7303 Prediabetes: Secondary | ICD-10-CM

## 2024-01-09 DIAGNOSIS — I10 Essential (primary) hypertension: Secondary | ICD-10-CM

## 2024-01-09 MED ORDER — AMLODIPINE BESYLATE 2.5 MG PO TABS
7.5000 mg | ORAL_TABLET | Freq: Every day | ORAL | 0 refills | Status: DC
Start: 2024-01-09 — End: 2024-04-03

## 2024-01-09 MED ORDER — OLOPATADINE HCL 0.1 % OP SOLN: 1.0000 [drp] | Freq: Two times a day (BID) | OPHTHALMIC | 1 refills | Status: AC

## 2024-01-09 NOTE — Progress Notes (Signed)
 Name: Nicole Ballard   MRN: [id number]    DOB: 1956-06-13   Date:01/09/2024       Progress Note  Subjective  Chief Complaint  Chief Complaint  Patient presents with   Medical Management of Chronic Issues   Discussed the use of AI scribe software for clinical note transcription with the patient, who gave verbal consent to proceed.  History of Present Illness Nicole Ballard is a 68 year old female who presents for a six-month follow-up for chronic balance issues and weight management.  She has a history of falls, weakness, and weight loss. Since her last visit in February, she has gained weight from 182 to 189.8 pounds. She attributes this weight gain to increased physical activity and dietary adjustments, aiming to feel stronger without exceeding 200 pounds. Despite the weight gain, she continues to experience balance issues, feeling as though she is 'pulling to one side'. She has been attending physical therapy at [facility], which she feels has helped reduce the frequency of falls, although she still experiences off-balance days.  She has chronic tension headaches and balance problems for which she sees a neurologist, Dr. Jadine May. She is currently taking cyclobenzaprine  5 mg at night for headaches. She is also taking pregabalin 75 mg twice a day for nerve pain.  Her blood pressure was noted to be high at 153/82 during a recent visit with her neurologist but is currently 116/72. She is on amlodipine  2.5 mg, atenolol  25 mg twice a day, and telmisartan  80 mg for hypertension. She does not regularly check her blood pressure at home. No chest pain or palpitations currently, although she has experienced chest pain radiating to her back in the past.  She has a history of lung disease affected by lupus, post-inflammatory pulmonary fibrosis, and asthma. She experiences shortness of breath intermittently and has a dry cough. She is on omeprazole  and Flovent  for her lung issues and Plaquenil for lupus.  She has recently tapered off prednisone . Her lupus primarily affects her lungs and possibly her kidneys, with a history of positive ANA and DSDNA.  She has chronic pain due to fibromyalgia, neurogenic claudication, and lupus. Her pain level is currently around 6.5 out of 10, exacerbated by physical activity. She takes pregabalin twice a day for pain management. She also experiences knee pain and muscle weakness.  She has a history of chronic depression and mild neurocognitive dysfunction. She is under psychiatric care at [facility] and is taking Lamictal , Effexor, hydroxyzine , trazodone, Klonopin , and bupropion  for her mental health. She feels anxious at times and uses Klonopin  as needed.  She has atherosclerosis and is on atorvastatin  for cholesterol management. She also has a history of prediabetes, previously having diabetes, which resolved with weight loss. Her current A1c is 5.2, and she is on metformin  500 mg for prediabetes.  She experiences gastroesophageal reflux disease (GERD) and takes omeprazole  twice a day and Pepcid as needed. She reports epigastric pain and rectal discomfort, indicating possible ongoing issues with GERD.  She has constipation and takes Linzess  but reports it is not effectively regulating her bowel movements.    Patient Active Problem List   Diagnosis Date Noted   Lumbar facet arthropathy 10/02/2023   Genetic testing 03/14/2023   Abnormal SPEP 01/13/2023   Weight loss 01/13/2023   Family history of breast cancer 01/13/2023   Asthma, chronic, severe persistent, uncomplicated 10/12/2022   Interstitial lung disease (HCC) 07/12/2022   Empty sella (HCC) 07/12/2022   History of craniotomy 07/12/2022  Immunocompromised (HCC) 04/29/2021   Hyperglycemia 10/23/2017   Vertigo 09/29/2016   Bronchiolitis obliterans organizing pneumonia (HCC) 09/29/2016   Neck pain, musculoskeletal 12/15/2015   Cough in adult 09/11/2015   Abnormal CAT scan 04/11/2015   Auditory impairment  04/11/2015   Insomnia, persistent 04/11/2015   Chronic kidney disease (CKD), stage I 04/11/2015   Chronic nonmalignant pain 04/11/2015   Chronic constipation 04/11/2015   Dyslipidemia 04/11/2015   Fibromyalgia 04/11/2015   Gastro-esophageal reflux disease without esophagitis 04/11/2015   Dysphonia 04/11/2015   Low back pain 04/11/2015   Eczema intertrigo 04/11/2015   Keratosis pilaris 04/11/2015   Chronic recurrent major depressive disorder (HCC) 04/11/2015   Dysmetabolic syndrome 04/11/2015   Neurogenic claudication 04/11/2015   Perennial allergic rhinitis with seasonal variation 04/11/2015   Abnormal presence of protein in urine 04/11/2015   Seborrhea capitis 04/11/2015   Moderate dysplasia of vulva 04/11/2015   Vitamin D  deficiency 04/11/2015   Spinal stenosis of lumbar region 04/11/2015   Treadmill stress test negative for angina pectoris 03/05/2015   Shortness of breath 05/20/2014   H/O total knee replacement 12/31/2013   Episcleritis 09/26/2013   Vocal cord dysfunction 05/28/2013   Anxiety, generalized 03/19/2013   Back pain 12/18/2012   Pulmonary nodules 11/26/2012   Lung nodule, multiple 11/26/2012   Arthritis, degenerative 11/01/2012   H/O aspiration pneumonitis 10/22/2012   Postinflammatory pulmonary fibrosis (HCC) 10/22/2012   Mixed incontinence 05/04/2012   Lung involvement in systemic lupus erythematosus (HCC) 11/05/2010   Chronic nausea 07/01/2008   Benign hypertension 01/25/2005    Past Surgical History:  Procedure Laterality Date   BRAIN SURGERY     CHOLECYSTECTOMY     VAGINAL HYSTERECTOMY     vulvar surgery      Family History  Problem Relation Age of Onset   Diabetes Mother    Hypertension Mother    Breast cancer Mother    Stomach cancer Father        d.>50   Breast cancer Maternal Aunt        dx >50   Breast cancer Maternal Aunt        dx 68s   Breast cancer Maternal Aunt    Cancer Maternal Uncle        unk type   Cancer Maternal Uncle         unk type   Breast cancer Cousin        dx 24s   Breast cancer Cousin        dx 30s, does not want genetic testing    Social History   Tobacco Use   Smoking status: Never   Smokeless tobacco: Never  Substance Use Topics   Alcohol use: No    Alcohol/week: 0.0 standard drinks of alcohol     Current Outpatient Medications:    albuterol  (VENTOLIN  HFA) 108 (90 Base) MCG/ACT inhaler, SMARTSIG:2 Puff(s) By Mouth Every 6 Hours PRN, Disp: , Rfl:    aspirin  EC 81 MG tablet, Take 1 tablet (81 mg total) by mouth daily., Disp: 30 tablet, Rfl: 0   atenolol  (TENORMIN ) 25 MG tablet, Take 1 tablet by mouth in the morning and at bedtime., Disp: , Rfl:    atorvastatin  (LIPITOR) 40 MG tablet, TAKE 1 TABLET BY MOUTH DAILY, Disp: 100 tablet, Rfl: 2   Biotin  1 MG CAPS, Take 1 capsule by mouth daily., Disp: 30 capsule, Rfl: 0   buPROPion  (WELLBUTRIN  XL) 300 MG 24 hr tablet, Take 300 mg by mouth in the morning., Disp: ,  Rfl:    Cholecalciferol (VITAMIN D ) 2000 UNITS CAPS, Take 1 capsule by mouth daily., Disp: , Rfl:    clobetasol ointment (TEMOVATE) 0.05 %, Apply 1 application topically 2 (two) times daily., Disp: , Rfl:    clonazePAM  (KLONOPIN ) 0.5 MG tablet, Take 0.25 mg by mouth at bedtime., Disp: , Rfl:    cyclobenzaprine  (FLEXERIL ) 5 MG tablet, Take by mouth., Disp: , Rfl:    diclofenac  Sodium (VOLTAREN ) 1 % GEL, Apply topically daily as needed., Disp: , Rfl:    fluticasone  (FLONASE ) 50 MCG/ACT nasal spray, Place 2 sprays into both nostrils daily., Disp: 48 g, Rfl: 3   hydrALAZINE  (APRESOLINE ) 10 MG tablet, TAKE 1 TABLET BY MOUTH 2 TIMES DAILY AS NEEDED., Disp: 180 tablet, Rfl: 1   hydroxychloroquine (PLAQUENIL) 200 MG tablet, Take by mouth 2 (two) times daily., Disp: , Rfl:    hydrOXYzine  (ATARAX ) 10 MG tablet, Take 1 tablet (10 mg total) by mouth 3 (three) times daily as needed., Disp: 30 tablet, Rfl: 0   lamoTRIgine  (LAMICTAL ) 200 MG tablet, Take 400 mg by mouth at bedtime., Disp: , Rfl:     lidocaine  (XYLOCAINE ) 5 % ointment, Apply 1 Application topically 4 (four) times daily as needed., Disp: , Rfl:    LINZESS  145 MCG CAPS capsule, Take 145 mcg by mouth daily., Disp: , Rfl:    loratadine  (CLARITIN ) 10 MG tablet, Take 1 tablet (10 mg total) by mouth daily., Disp: 90 tablet, Rfl: 1   Melatonin 10 MG TABS, Take 1 tablet by mouth at bedtime., Disp: , Rfl:    metFORMIN  (GLUCOPHAGE -XR) 500 MG 24 hr tablet, TAKE 1 TABLET BY MOUTH DAILY  WITH BREAKFAST, Disp: 100 tablet, Rfl: 0   mycophenolate  (MYFORTIC) 360 MG TBEC EC tablet, Take 720 mg by mouth 2 (two) times daily., Disp: , Rfl:    omeprazole  (PRILOSEC) 40 MG capsule, Take 1 capsule by mouth in the morning and at bedtime. Pulmonologist, Disp: , Rfl:    ondansetron  (ZOFRAN ) 4 MG tablet, Take 1 tablet (4 mg total) by mouth every 8 (eight) hours as needed for nausea or vomiting., Disp: 20 tablet, Rfl: 0   pregabalin (LYRICA) 75 MG capsule, Take 75 mg by mouth 2 (two) times daily., Disp: , Rfl:    Spacer/Aero-Holding Chambers (AEROCHAMBER MV) inhaler, 1 each by Other route., Disp: , Rfl:    telmisartan  (MICARDIS ) 80 MG tablet, TAKE 1 TABLET BY MOUTH DAILY, Disp: 100 tablet, Rfl: 2   traZODone (DESYREL) 100 MG tablet, Take 150 mg by mouth at bedtime., Disp: , Rfl:    triamcinolone  ointment (KENALOG ) 0.1 %, , Disp: , Rfl:    amLODipine  (NORVASC ) 2.5 MG tablet, Take 3 tablets (7.5 mg total) by mouth daily., Disp: 270 tablet, Rfl: 0   famotidine (PEPCID) 20 MG tablet, Take 20 mg by mouth daily., Disp: , Rfl:    olopatadine  (PATANOL) 0.1 % ophthalmic solution, Place 1 drop into both eyes 2 (two) times daily., Disp: 5 mL, Rfl: 1  Allergies  Allergen Reactions   Ace Inhibitors     Other reaction(s): OTHER Pt states she can not take ace inhibitors. Pt states she can not remember her reaction.    Bee Pollen    Penicillins    Pollen Extract     I personally reviewed active problem list, medication list, allergies, family history with the  patient/caregiver today.   ROS  Ten systems reviewed and is negative except as mentioned in HPI    Objective Physical Exam  MEASUREMENTS: Weight- 189.8. CONSTITUTIONAL: Patient appears well-developed and well-nourished. No distress. HEENT: Head atraumatic, normocephalic, neck supple. CARDIOVASCULAR: Normal rate, regular rhythm and normal heart sounds. No murmur heard. No edema in extremities. PULMONARY: Effort normal and breath sounds clear. No respiratory distress. ABDOMINAL: There is no tenderness or distention. MUSCULOSKELETAL: Normal gait. Joints in hands without inflammation.  PSYCHIATRIC: Patient has a normal mood and affect. Behavior is normal. Judgment and thought content normal.  Vitals:   01/09/24 0918  BP: 116/72  Pulse: 84  Resp: 16  SpO2: 98%  Weight: 189 lb 12.8 oz (86.1 kg)  Height: 5\' 7"  (1.702 m)    Body mass index is 29.73 kg/m.  Recent Results (from the past 2160 hours)  Basic metabolic panel     Status: Abnormal   Collection Time: 12/25/23  8:47 AM  Result Value Ref Range   Sodium 140 135 - 145 mmol/L   Potassium 3.9 3.5 - 5.1 mmol/L   Chloride 105 98 - 111 mmol/L   CO2 26 22 - 32 mmol/L   Glucose, Bld 93 70 - 99 mg/dL    Comment: Glucose reference range applies only to samples taken after fasting for at least 8 hours.   BUN 18 8 - 23 mg/dL   Creatinine, Ser 1.61 (H) 0.44 - 1.00 mg/dL   Calcium  8.9 8.9 - 10.3 mg/dL   GFR, Estimated >09 >60 mL/min    Comment: (NOTE) Calculated using the CKD-EPI Creatinine Equation (2021)    Anion gap 9 5 - 15    Comment: Performed at [facility], [address] [place], [place] [address]  CBC     Status: Abnormal   Collection Time: 12/25/23  8:47 AM  Result Value Ref Range   WBC 3.6 (L) 4.0 - 10.5 K/uL   RBC 4.58 3.87 - 5.11 MIL/uL   Hemoglobin 12.8 12.0 - 15.0 g/dL   HCT 81.1 91.4 - 78.2 %   MCV 87.1 80.0 - 100.0 fL   MCH 27.9 26.0 - 34.0 pg   MCHC 32.1 30.0 - 36.0 g/dL   RDW 95.6 21.3 -  08.6 %   Platelets 295 150 - 400 K/uL   nRBC 0.0 0.0 - 0.2 %    Comment: Performed at [facility], [address] [place], [place] [address]  Troponin I (High Sensitivity)     Status: None   Collection Time: 12/25/23  8:47 AM  Result Value Ref Range   Troponin I (High Sensitivity) 4 <18 ng/L    Comment: (NOTE) Elevated high sensitivity troponin I (hsTnI) values and significant  changes across serial measurements may suggest ACS but many other  chronic and acute conditions are known to elevate hsTnI results.  Refer to the "Links" section for chest pain algorithms and additional  guidance. Performed at [facility], [address] [place], [place] [address]   D-dimer, quantitative     Status: None   Collection Time: 12/25/23  8:47 AM  Result Value Ref Range   D-Dimer, Quant 0.33 0.00 - 0.50 ug/mL-FEU    Comment: (NOTE) At the manufacturer cut-off value of 0.5 g/mL FEU, this assay has a negative predictive value of 95-100%.This assay is intended for use in conjunction with a clinical pretest probability (PTP) assessment model to exclude pulmonary embolism (PE) and deep venous thrombosis (DVT) in outpatients suspected of PE or DVT. Results should be correlated with clinical presentation. Performed at [facility], [address], [place], [place] [address]   Troponin  I (High Sensitivity)     Status: None   Collection Time: 12/25/23 11:38 AM  Result Value Ref Range   Troponin I (High Sensitivity) 5 <18 ng/L    Comment: (NOTE) Elevated high sensitivity troponin I (hsTnI) values and significant  changes across serial measurements may suggest ACS but many other  chronic and acute conditions are known to elevate hsTnI results.  Refer to the "Links" section for chest pain algorithms and additional  guidance. Performed at [facility], [address] [place], [place] [address]      PHQ2/9:    01/09/2024    9:14 AM 10/10/2023    3:32 PM  08/09/2023    2:49 PM 07/24/2023    1:06 PM 07/04/2023    8:40 AM  Depression screen PHQ 2/9  Decreased Interest 1 0 2 0 1  Down, Depressed, Hopeless 1 0 2 0 1  PHQ - 2 Score 2 0 4 0 2  Altered sleeping 1 0 1 0 1  Tired, decreased energy 1 0 2 0 1  Change in appetite 1 0 2 0 1  Feeling bad or failure about yourself  0 0 0 0 0  Trouble concentrating 0 0 1 0 0  Moving slowly or fidgety/restless 0 0 0 0 0  Suicidal thoughts 0 0 0 0 0  PHQ-9 Score 5 0 10 0 5  Difficult doing work/chores Somewhat difficult Not difficult at all Somewhat difficult Not difficult at all     phq 9 is positive  Fall Risk:    01/09/2024    9:10 AM 10/10/2023    3:32 PM 08/09/2023    2:48 PM 07/24/2023    1:06 PM 07/04/2023    8:39 AM  Fall Risk   Falls in the past year? 0 1 1 1 1   Number falls in past yr: 0 1 1 1 1   Injury with Fall? 0 0 1 1 0  Risk for fall due to : No Fall Risks Impaired balance/gait History of fall(s) Impaired balance/gait No Fall Risks  Follow up Falls prevention discussed;Education provided;Falls evaluation completed Education provided;Falls prevention discussed;Falls evaluation completed Falls prevention discussed;Education provided;Falls evaluation completed Falls prevention discussed;Education provided;Falls evaluation completed Falls prevention discussed    Assessment & Plan Asthma, severe persistent Severe persistent asthma managed with Flovent  and omeprazole . - Continue Flovent  and omeprazole . - Follow up with pulmonologist at [facility].  Lupus with lung involvement Lupus affects lungs causing pulmonary fibrosis. Managed with Plaquenil and Myfortic. Off prednisone . - Continue Plaquenil and Myfortic 720 mg twice a day. - Follow up with rheumatologist next week.  Immunosuppression due to lupus treatment Immunosuppression from lupus treatment with Plaquenil and Myfortic. - Continue current lupus medications. - Monitor for signs of infection.  Hypertension Blood pressure elevated  at neurologist's office but well-controlled today. - Continue amlodipine  2.5 mg, atenolol  25 mg twice a day, and telmisartan  80 mg. - Prescribe amlodipine  2.5 mg with no refills left. - Monitor blood pressure at home more frequently. - Use hydralazine  as needed for spikes over 150/90.  Chronic kidney disease, stage 1 Stage 1 CKD possibly related to hypertension.  Chronic pain due to fibromyalgia and neurogenic claudication Chronic pain managed with pregabalin and physical therapy. - Continue pregabalin twice a day. - Continue physical therapy for balance and pain management. - Follow up with pain management specialist.  Chronic tension headaches/Empty Sella/Balance issues  Managed with cyclobenzaprine . Neurologist involved. Nerve conduction study  - Continue cyclobenzaprine  5 mg at night. -  Follow up with neurologist as needed. - PT has decreased fall episodes - Consider nerve conduction study   Depression Chronic depression with mild neurocognitive dysfunction managed with multiple medications. - Continue lamotrigine , bupropion , trazodone, and Klonopin  as needed for anxiety. - Follow up with psychiatrist at [facility].  Atherosclerosis of aorta Managed with atorvastatin . No current symptoms. - Continue atorvastatin  daily. - Monitor cholesterol levels regularly.  Gastroesophageal reflux disease (GERD) GERD with ongoing epigastric pain despite treatment. - Continue omeprazole  twice a day and Pepcid as needed. - she will contact her  GI specialist for further evaluation.  Chronic Idiopathic Constipation  Constipation not well controlled with Linzess . - Adjust Linzess  dosage as needed. - Monitor bowel movements and adjust treatment accordingly.  Perennial allergies Having eye symptoms and sending refill of eye drops

## 2024-01-09 NOTE — Telephone Encounter (Signed)
 Please contact pt to schedue awv. Thank you

## 2024-01-10 ENCOUNTER — Ambulatory Visit
Admit: 2024-01-10 | Discharge: 2024-01-11 | Payer: Medicare (Managed Care) | Attending: Adult Health | Primary: Adult Health

## 2024-01-10 DIAGNOSIS — K219 Gastro-esophageal reflux disease without esophagitis: Principal | ICD-10-CM

## 2024-01-10 DIAGNOSIS — I10 Essential (primary) hypertension: Principal | ICD-10-CM

## 2024-01-10 DIAGNOSIS — R002 Palpitations: Principal | ICD-10-CM

## 2024-01-10 DIAGNOSIS — R0789 Other chest pain: Principal | ICD-10-CM

## 2024-01-10 NOTE — Unmapped (Signed)
 DIVISION OF CARDIOLOGY   [employer] , [place]ia [place]st        Date of Service: 01/10/2024    Assessment/Plan     1.Atypical Chest pain  Patient presenting today with atypical epigastric pain, likely GI based on presentation of symptoms and recent essentially normal CCTA. No exertional component, normal EKG today. She is taking omeprazole  BID. Recommend she see her GI provider for further evaluation.   - Continue atorvastatin 40 mg for primary prevention  - Stop ASA given no CAD    2. Hypertension.   Elevated today iso not taking AM meds. She will monitor home readings.   - Continue amlodipine , hydralazine , telmisartan       3. Palpitations  4.  PVCs  Ziopatch showed no significant arrhythmias. Symptoms correlated with SR or VE (<1% burden)  Well controlled with no episodes of palpitations since last medication change.  - Continue atenolol  37.5 mg bid.    Return to clinic:    Return in about 1 year (around 01/09/2025).      Arnetha Bhat, AGNP-C  Cardiology Nurse Practitioner  [facility]    Subjective:   [id number], Clevester Dally, MD  Chief complaint:  68 y.o. female with HTN, SLE, Bipolar 1, fibromyalgia, post inflammatory pulmonary fibrosis who presents for f/up after ED visit for chest pain    History of Present Illness:  First seen 05/13/22 for palpitations.   Ziopatch showed no significant arrhythmias. Symptoms correlated with SR or VE (<1% burden)  Echo in December 2022 showed structurally normal heart.  Symptoms controlled with ATenolol  37.5 mg bid    Long hx of atypical chest pain with multiple unrevealing ED visits. Last seen 06/21/23 for this, likely GERD. Refilled PPI.   CCTA 07/05/23 - Minimal luminal irregularity within the RCA up to minimal stenosis.   She has baseline shortness of breath due to her ILD, she walks daily.     Seen in ED 12/25/23 for chest pain.   Has been having chest pains, epigastric area. Was happening every day but has calmed down some. Lasts a few minutes, dull pain and then self resolves. No exertional component. Had endoscopy a long time ago. Sees GI but unsure who. Takes omeprazole  bid.   Home BP 130/80s. Was good yesterday.   Hasn't taken meds today.     Palpitations well controlled on atenolol , no episodes since starting higher dose.     Cardiovascular History:  HTN  Previous exercise stress echocardiogram performed at [facility] in May 2021 showed no evidence of ischemia  Previous CT angiogram performed in May 2021 showed no evidence of coronary calcification, this was a PE protocol CT scan which also showed evidence of pulmonary embolism    Past Medical History  Patient Active Problem List   Diagnosis    Hypertension, benign    Benign neoplasm of pituitary gland and craniopharyngeal duct (pouch)    Chronic kidney disease, stage I    Dysphonia    Mixed urge and stress incontinence    Myalgia and myositis    Osteoarthrosis    Proteinuria    Sensorineural hearing loss    VIN II (vulvar intraepithelial neoplasia II)    Family history of breast cancer    Pain medication agreement signed    Episcleritis    SLE (systemic lupus erythematosus)    S/P total knee replacement    Asthma    Postinflammatory pulmonary fibrosis    Other diseases of vocal cords  Regurgitation    Epigastric burning sensation    Aspiration pneumonitis    Back pain    Cough    Multiple pulmonary nodules    Shortness of breath    Vocal cord dysfunction    Screening for cardiovascular condition    Obesity with body mass index 30 or greater    Generalized anxiety disorder    Osteoarthritis    Chronic constipation    Chronic recurrent major depressive disorder (CMS-HCC)    Dyslipidemia    Obesity, diabetes, and hypertension syndrome    Intertrigo    Fibrositis    Gastroesophageal reflux disease without esophagitis    Vitamin D deficiency    Encounter for screening for cardiovascular disorders    Seborrhea capitis    Perennial allergic rhinitis with seasonal variation    Moderate dysplasia of vulva Spinal stenosis of lumbar region    Keratosis pilaris    Fibromyalgia    Insomnia, persistent    Neck pain    Arthritis, degenerative    Asthma, chronic    Auditory impairment    Bronchiolitis obliterans organizing pneumonia    Chronic nausea    Eczema intertrigo    H/O aspiration pneumonitis    H/O total knee replacement    Lung involvement in systemic lupus erythematosus    Lung nodule, multiple    Neurogenic claudication    Pulmonary nodules    Treadmill stress test negative for angina pectoris    Vertigo    Hyperglycemia    Otalgia    Immunocompromised    PVC (premature ventricular contraction)    Lumbar facet arthropathy       Medications:  Current Outpatient Medications   Medication Sig Dispense Refill    albuterol  HFA 90 mcg/actuation inhaler Inhale 2 puffs every six (6) hours as needed for wheezing. 8 g 11    amlodipine  (NORVASC ) 2.5 MG tablet Take 3 tablets (7.5 mg total) by mouth daily. 90 tablet 3    atenolol  (TENORMIN ) 25 MG tablet Take 1.5 tablets (37.5 mg total) by mouth two (2) times a day. 300 tablet 3    atorvastatin (LIPITOR) 40 MG tablet Take 1 tablet (40 mg total) by mouth daily.      biotin 1 mg cap Take by mouth.      buPROPion  (WELLBUTRIN  XL) 300 MG 24 hr tablet TAKE 1 TABLET BY MOUTH EVERY DAY 90 tablet 3    carboxymethylcellulose sodium (ARTIFICIAL TEARS, CMC,) 1 % Drop Apply to eye.      clonazePAM  (KLONOPIN ) 0.5 MG tablet Take 0.5 tablets (0.25 mg total) by mouth Three (3) times a day as needed for anxiety. 30 tablet 1    clotrimazole -betamethasone  (LOTRISONE ) 1-0.05 % cream Apply 1 Application topically two (2) times a day.      cyclobenzaprine  (FLEXERIL ) 5 MG tablet Take 1 tablet (5 mg total) by mouth two (2) times a day as needed for muscle spasms. 120 tablet 1    diclofenac  sodium (VOLTAREN ) 1 % gel Apply 2 g topically two (2) times a day as needed for arthritis. 300 g 5    dicyclomine (BENTYL) 20 mg tablet Take 1 tablet (20 mg total) by mouth daily as needed.      famotidine  (PEPCID ) 20 MG tablet TAKE 1 TABLET BY MOUTH TWICE  DAILY 200 tablet 2    fluticasone  propionate (FLONASE ) 50 mcg/actuation nasal spray       hydrALAZINE  (APRESOLINE ) 10 MG tablet       hydroxychloroquine  (PLAQUENIL ) 200  mg tablet Take 1 tablet (200 mg total) by mouth two (2) times a day. 180 tablet 3    hydrOXYzine (ATARAX) 10 MG tablet       inhalational spacing device (AEROCHAMBER MV) Spcr 1 each by Miscellaneous route two (2) times a day. With Fovent 1 each 0    lamoTRIgine  (LAMICTAL ) 200 MG tablet TAKE 1 TABLET BY MOUTH TWICE  DAILY 200 tablet 2    lidocaine  (XYLOCAINE ) 5 % ointment Apply topically four (4) times a day. 480 g 0    linaCLOtide  (LINZESS ) 290 mcg capsule Take 1 capsule (290 mcg total) by mouth daily. 30 capsule 5    loratadine (CLARITIN) 10 mg tablet Take 1 tablet (10 mg total) by mouth daily.      melatonin 10 mg Tab Take 1 tablet by mouth.      metFORMIN (GLUCOPHAGE-XR) 500 MG 24 hr tablet Take 1 tablet (500 mg total) by mouth daily before breakfast.      mycophenolate  (MYFORTIC ) 360 MG TbEC Take 2 tablets (720 mg total) by mouth two (2) times a day. 360 tablet 3    omeprazole  (PRILOSEC) 40 MG capsule Take 1 capsule (40 mg total) by mouth two (2) times a day. 180 capsule 3    ondansetron  (ZOFRAN ) 4 MG tablet       polyethylene glycol (GLYCOLAX ) 17 gram/dose powder Take 17 g by mouth daily.      pregabalin  (LYRICA ) 75 MG capsule Take 1 capsule (75 mg total) by mouth two (2) times a day. 120 capsule 1    telmisartan  (MICARDIS ) 80 MG tablet Take 1 tablet (80 mg total) by mouth daily. 90 tablet 3    traZODone  (DESYREL ) 100 MG tablet TAKE 1 AND 1/2 TABLETS BY MOUTH  EVERY NIGHT 135 tablet 3     No current facility-administered medications for this visit.       Allergies  Allergies   Allergen Reactions    Vilazodone Swelling    Ace Inhibitors Other (See Comments)     Pt states she can not take ace inhibitors. Pt states she can not remember her reaction.     Bee Pollen Itching     COUGH, WATERY EYES Penicillin G Rash    Penicillins Rash    Pollen Extracts Itching     COUGHING, WATERY EYES       Social History:   Social History     Tobacco Use    Smoking status: Never     Passive exposure: Never    Smokeless tobacco: Never   Vaping Use    Vaping status: Never Used   Substance Use Topics    Alcohol use: Never    Drug use: Never     Comment: No history of IVDU, cocaine, or methamphetamines. No history of anorexigens.       Family History:  Family History   Problem Relation Age of Onset    Breast cancer Mother     Hypertension Mother     Diabetes Mother     Cancer Mother     Stomach cancer Father 70        unclear where primary was, died age 71    Cancer Father     Diabetes Sister     Cancer Sister     Diabetes Sister     Arthritis Sister     COPD Sister     No Known Problems Daughter     No Known Problems Maternal Grandmother  Glaucoma Maternal Grandfather     Stroke Maternal Grandfather     No Known Problems Paternal Grandmother     Stroke Paternal Grandfather     Diabetes Paternal Grandfather     Breast cancer Maternal Aunt 22    Breast cancer Maternal Aunt 69    Cancer Maternal Aunt 1        unk. primary    Breast cancer Maternal Aunt         died around age 93, unclear age of diagnosis    Stomach cancer Maternal Uncle         unsure primary, died age 8    Stomach cancer Paternal Aunt         unclear where primary was    Cancer Paternal Aunt     Cancer Paternal Uncle         back    Breast cancer Cousin 20        Died age 87. Earl's daughter.     Breast cancer Cousin 68        Treated with mastectomy. Now 51. Jo's daughter.     No Known Problems Other     Breast cancer Maternal Aunt     Cancer Maternal Aunt     Breast cancer Maternal Aunt     Cancer Maternal Aunt     Diabetes Sister     ADD / ADHD Neg Hx     Alcohol abuse Neg Hx     Anxiety disorder Neg Hx     Bipolar disorder Neg Hx     Dementia Neg Hx     Depression Neg Hx     Drug abuse Neg Hx     OCD Neg Hx     Paranoid behavior Neg Hx     Physical abuse Neg Hx     Schizophrenia Neg Hx     Seizures Neg Hx     Sexual abuse Neg Hx     Colon cancer Neg Hx     Endometrial cancer Neg Hx     Ovarian cancer Neg Hx     BRCA 1/2 Neg Hx     Melanoma Neg Hx     Basal cell carcinoma Neg Hx     Squamous cell carcinoma Neg Hx        ROS- 12 system review is negative other than what is specified in the History of Present Illness.      Objective:   Physical Exam  Vitals:    01/10/24 0846   BP: 156/79   Pulse: 65   SpO2: 96%   Weight: 87.5 kg (193 lb)   Height: 170.2 cm (5' 7)       Wt Readings from Last 3 Encounters:   01/10/24 87.5 kg (193 lb)   01/02/24 88 kg (194 lb)   11/17/23 85.7 kg (189 lb)     General-  Patient is well-appearing in no acute distress  Neurologic- Alert and oriented X3.  Cranial nerve II-XII grossly intact.  Cardiovascular-RRR, normal S1/S2, no murmurs/rubs/gallops  Pulmonology- CTAB without wheezing, rales, or rhonchi  Extremities: No peripheral edema.   Psych- Normal mood, appropriate.    Laboratory data:      Component Value Date/Time    PROBNP 12.6 02/01/2017 1039    PROBNP 16 01/23/2014 1241       Lab Results   Component Value Date    WBC 5.0 09/21/2023    HGB 12.3 09/21/2023    HCT 37.5 09/21/2023  PLT 391 09/21/2023       Lab Results   Component Value Date    NA 143 06/06/2023    K 4.6 06/06/2023    CL 105 06/06/2023    CO2 31.0 06/06/2023    BUN 8 (L) 09/21/2023    CREATININE 0.92 09/21/2023    GLU 81 06/06/2023    CALCIUM 9.5 06/06/2023    MG 2.1 12/31/2013    PHOS 3.5 06/06/2023       Lab Results   Component Value Date    TSH 0.626 11/17/2023    ALBUMIN 3.6 06/06/2023    ALT 11 05/18/2023    AST 15 05/18/2023    INR 1.11 01/03/2014       No results found for: LDL, HDL    Electrocardiogram:  From 05/13/22 shows normal sinus rhythm minimal voltage criteria for LVH  From 06/20/23 shows sinus bradycardia  01/10/24 - NSR    Cardiac Monitor:  Patient had a min HR of 52 bpm, max HR of 123 bpm, and avg HR of 67 bpm. Predominant underlying rhythm was Sinus Rhythm. Isolated SVEs were rare (<1.0%), and no SVE Couplets or SVE Triplets were present. Isolated VEs were rare (<1.0%), and no VE Couplets or VE Triplets were present.   Symptoms were associated with sinus rhythm and also isolated VEs.     Echocardiogram 08/27/21:   1. The left ventricular systolic function is normal, LVEF is visually  estimated at 60-65%.    2. The right ventricle is normal in size, with normal systolic function.    3. The pulmonary systolic pressure cannot be estimated due to insufficient  TR signal.    CCTA 07/05/23  CAD-RADS 1/P1. Minimal luminal irregularity within the RCA up to minimal stenosis.

## 2024-01-10 NOTE — Unmapped (Signed)
 REcommend you f/up with your GI provider    Coronary CTA showed clean coronary arteries - very unlikely these pains are from your heart. I suspect it is GI related.

## 2024-01-11 NOTE — Telephone Encounter (Signed)
 Patient scheduled for AWV June 20th

## 2024-01-14 ENCOUNTER — Other Ambulatory Visit: Payer: Self-pay | Admitting: Family Medicine

## 2024-01-14 DIAGNOSIS — I10 Essential (primary) hypertension: Secondary | ICD-10-CM

## 2024-01-14 DIAGNOSIS — K5904 Chronic idiopathic constipation: Principal | ICD-10-CM

## 2024-01-14 MED ORDER — LINZESS 290 MCG CAPSULE
ORAL_CAPSULE | Freq: Every day | ORAL | 5 refills | 0.00000 days
Start: 2024-01-14 — End: ?

## 2024-01-15 ENCOUNTER — Encounter
Admit: 2024-01-15 | Discharge: 2024-01-16 | Payer: Medicare (Managed Care) | Attending: Psychologist | Primary: Psychologist

## 2024-01-15 DIAGNOSIS — F410 Panic disorder [episodic paroxysmal anxiety] without agoraphobia: Principal | ICD-10-CM

## 2024-01-15 DIAGNOSIS — F411 Generalized anxiety disorder: Principal | ICD-10-CM

## 2024-01-15 DIAGNOSIS — F331 Major depressive disorder, recurrent, moderate: Principal | ICD-10-CM

## 2024-01-15 DIAGNOSIS — F4310 Post-traumatic stress disorder, unspecified: Principal | ICD-10-CM

## 2024-01-15 MED ORDER — LINZESS 290 MCG CAPSULE
ORAL_CAPSULE | Freq: Every day | ORAL | 3 refills | 30.00000 days | Status: CP
Start: 2024-01-15 — End: ?

## 2024-01-15 NOTE — Unmapped (Signed)
 [facility]   Confidential Psychological Therapy Session      Patient Name: Caitlin Smith  Medical Record Number: [id number]  Date of Service: Jan 15, 2024  Attending Psychologist: Donette Furlong, PhD  CPT Procedure Codes: 81191 for 60 mins of face to face counseling    This visit was attempted via face to face with interactive technology using a HIPPA compliant audio/visual platform; patient unable to connect and so telephone was used. Her computer apparently broke last night. We reviewed confidentiality today. The patient was present in [place] , a state in which this provider is licensed and able to provide care (location and contact information confirmed), attended this visit alone, and consented to this virtual pain psychology visit.    REFERRING PHYSICIAN: Ronita Cohens, MD    CHIEF COMPLAINT AND REASON FOR VISIT: pain coping skills, CBT to address depression and anxiety in the setting of chronic pain    SUBJECTIVE / HISTORY OF PRESENT ILLNESS: [name] is a very pleasant 68 y.o.  female from [place], [place] with multiple chronic pain complaints related to fibromyalgia and lupus who initially met with me in October 2016, at which time she was diagnosed with severe depression, PTSD, panic disorder, and generalized anxiety. The patient returns for a therapy session today. Last follow up with me was in 10/30/23.    Pt processes thoughts and feelings about health stressors, using a mindfulness approach.  Her PCP may be using AI for note writing, pt unclear but was confused and put off by the request. Reviewed purpose (assuming AI) and how she can communicate her needs and questions in the future for clarification, and for informed consent. Pt had lunch with daughter and granddaughter yesterday for mother's day. Pt's son did not make time for her on mother's day this year nor in 3 years past. Problem solved how to communicate her thoughts and feelings about it, values and needs. Spent extra time processing why she feels more anxious and agitated - pharmacy out of gabapentin  so did not fill on time, has been without it for 3-4 days now. Plans to call again today to see if pharmacy has gotten it in.     OBJECTIVE / MENTAL STATUS:    Appearance:   Appears stated age and Clean/Neat   Motor:  No abnormal movements   Speech/Language:   Normal rate, volume, tone, fluency   Mood:  Depressed and Anxious   Affect:  euthymic   Thought process:  Logical, linear, clear, coherent, goal directed   Thought content:    Denies SI, HI, self harm, delusions, obsessions, paranoid ideation, or ideas of reference   Perceptual disturbances:    Denies auditory and visual hallucinations, behavior not concerning for response to internal stimuli   Orientation:  Oriented to person, place, time, and general circumstances   Attention:  Able to fully attend without fluctuations in consciousness   Concentration:  Able to fully concentrate and attend   Memory:  Immediate, short-term, long-term, and recall grossly intact    Fund of knowledge:   Consistent with level of education and development   Insight:    Fair   Judgment:   Intact   Impulse Control:  Intact     DIAGNOSTIC IMPRESSION:   Post Traumatic Stress Disorder (PTSD)  Generalized Anxiety Disorder (GAD)  Panic Disorder  Major Depressive Disorder, moderate, recurrent  Chronic pain syndrome  Fibromyalgia  Lupus    ASSESSMENT:   [name] is a  very pleasant 68 y.o.  female from [place], [place] with multiple chronic pain complaints related to lupus, arthritis, and fibromyalgia. She was previously seen in our clinic from 2012-2014, and reestablished care with Dr. Royston Cornea in August 2016. The patient has struggled with depression and anxiety, but is very motivated to participate in intensive, multidisciplinary treatment to address the connection between depression, anxiety and pain. She has a long-standing history of depression and anxiety, and has worked with outpatient psychiatrist and therapist over the years. She is currently established with [facility] psychiatry.  In general, depression, anxiety, pain coping and panic have all improved (until recent bout with pulmonary issues), but the patient evidences a seasonal affective pattern of mood changes typically her mood tends to be worse during the winter.  Encouraged early morning sunlight and getting outside and walking as tolerated, building cardiovascular and muscular strength and endurance over time. Also encouraged practice of daily behavioral relaxation exercises as well. Has lost weight, unclear why.    PLAN:   (1) Psychotherapy - Continue CBT. CBT will be used to address PTSD, MDD, Panic D/o, GAD, and chronic pain.     --Behavioral Activation - Encouraged behavioral activation.  Is trying. Walking in AM.  --Downloaded and uses Insight Timer, guided relaxation app, for nightly practice.  --Continues to utilize diaphragmatic breathing daily  --encouraged early morning sunlight / light therapy for possible SAD in the setting of chronic MDD    (2) Psychiatry - Pt currently established with [facility].  Is followed by Dr. Cheryn Coss (attending) but typically has appointments with a psychiatrist resident, Dr. Herbert Loh.    (3) Safety - Pt denies any current or recent SI or safety concerns. Knows to call 911 or go to her local ED. Also previously given [facility].

## 2024-01-17 ENCOUNTER — Ambulatory Visit
Admit: 2024-01-17 | Payer: Medicare (Managed Care) | Attending: Rehabilitative and Restorative Service Providers" | Primary: Rehabilitative and Restorative Service Providers"

## 2024-01-17 ENCOUNTER — Ambulatory Visit
Admit: 2024-01-17 | Discharge: 2024-02-04 | Payer: Medicare (Managed Care) | Attending: Rehabilitative and Restorative Service Providers" | Primary: Rehabilitative and Restorative Service Providers"

## 2024-01-17 NOTE — Unmapped (Signed)
 [facility] PT [facility]  OUTPATIENT PHYSICAL THERAPY  01/17/2024  Note Type: Treatment Note       Patient Name: Caitlin Smith  Date of Birth:12-25-1955  Diagnosis:   Encounter Diagnoses   Name Primary?    Chronic pain syndrome Yes    Fibromyalgia     Spinal stenosis of lumbar region, unspecified whether neurogenic claudication present     Balance problem          Referring Provider: Referring, Unknown Per *    Date of Onset of Impairment: 11/23/2022  Date PT Care Plan Established or Reviewed: 12/05/2023  Date PT Treatment Started: 11/23/2023     Plan of Care Effective Date: 11/23/2023 - 02/13/2024  Session Number:  6     ASSESSMENT & PLAN   Assessment  Assessment details:    Today: Caitlin Smith is a pleasant 68 y.o. female who presents for Physical Therapy Evaluation with Chronic low back pain in setting of DDD and multiple joint pain due to fibromyalgia ongoing > 1 year. Patient progressing very well as expected. Patient able to complete STS with included resistance with decrease in plinth height at 18. Patient begins to have instability with tandem balance > 15 seconds.  The patient will benefit from skilled Physical Therapy intervention to address the moderate impairments listed below and to assist the patient in maximizing her functional independence and safe return to prior level of function.       Evaluation: Caitlin Smith is a pleasant 68 y.o. female who presents for Physical Therapy Evaluation with Chronic low back pain in setting of DDD and multiple joint pain due to fibromyalgia ongoing > 1 year. Primary impairments include difficulty with prolonged ambulation > 5 min, bending, lifting, twisting, sit to stand transfers, prolonged standing, and household chores. Clinical presentation today is consistent with low back pain due to DDD and multiple joint pain due to fibromyalgia. Patient has a history of chronic pain related to lupus and arthritis and is on rheumatological therapy The patient will benefit from skilled Physical Therapy intervention to address the moderate impairments listed below and to assist the patient in maximizing her functional independence and safe return to prior level of function.             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) (CMS-HCC) Chronic kidney disease, stage I Dysphonia Mixed urge and stress incontinence Myalgia and myositis Osteoarthrosis Proteinuria Sensorineural hearing loss VIN II (vulvar intraepithelial neoplasia II) Family history of breast cancer Pain medication agreement signed Episcleritis SLE (systemic lupus erythematosus) (CMS-HCC) S/P total knee replacement Asthma Postinflammatory pulmonary fibrosis (CMS-HCC)    Examination of Body Systems: musculoskeletal, activity/participation and communication    Clinical Presentation: stable    Clinical Decision Making: complex and moderate    Prognosis: good prognosis    Negative Prognosis Rationale: medical status/condition, chronicity of condition, severity of symptoms, ADL performance, endurance and strength.      Therapy Goals      Goals:        1. In 12 weeks the patient will demonstrate independent performance of HEP to maintain functional gains.   2. In 12 weeks: Pt. will have improved Lower extremity strength of 4+/5 in order to perform her house chores and ambulate in the community safely.  3. In 12 weeks the patient will  score a 10 point change on the ODI to demonstrate the MDC (0-100%; lower score indicates a lesser level of disability) and to indicate improved activity tolerance.   4. In 12 weeks the patient will complete an additional 3 STS transfers during the 30s chair stand test to met the MDC and to demonstrate increased functional leg strength.        Plan    Therapy options: will be seen for skilled physical therapy services    Planned therapy interventions: Balance Training, Education - Patient, Endurance Activites, Functional Mobility, Home Exercise Program, Education - Family/Caregiver, E-Stim, [employer], Civil engineer, contracting, Diaphragmatic/Pursed-lip Breathing, 97113-Aquatic Therapy, 97112-Neuromuscular Re-education, 97110-Therapeutic Exercises, 97116-Gait Training, 97140-Manual Therapy, 97530-Therapeutic Activities, 97535-Self-Care/Home Training, 97750-Physical Performance Test and 16109, 20561-Dry Needling 1-2, 3+ areas    DME Equipment: [employer].    Frequency: 1x week    Duration in weeks: 12    Education provided to: patient.    Education provided: HEP, Treatment options and plan, Symptom management, Safety education, Importance of Therapy, Anatomy, Body mechanics, Role of therapy in Rehabilitation, Posture, Community resources and Body awareness    Education results: verbalized good understanding, demonstrates understanding and needs reinforcement.    Communication/Consultation: N/A.      Total Session Time: 45    Treatment rendered today:      45        SUBJECTIVE         History of Present Condition     Date of onset:  11/23/2022 (Chronic)    History of Present Condition/Chief Complaint:    Associated Diagnoses    Chronic pain syndrome [G89.4]    Fibromyalgia [M79.7]    Spinal stenosis of lumbar region, unspecified whether neurogenic claudication present [M48.061]      Reason for Exam    Comments: Please assist with patient with using a TENs unit and work on myofascial release techniques for pain in the back        Evaluation: chronic pain localized to right knee and bilateral neck, shoulders and upper back myofascial pain and fibromyalgia, and lower back 2/2 lumbar facet arthropathy and DDD and moderate degree of lumbar central spinal canal stenosis. Patient has a history of chronic pain related to lupus and arthritis and is on rheumatological therapy.     worsened low back pain. The patient states that the pain feels like her pain goes all the way up her back into her shoulders. The patient confirms that trying to get up and standing makes her pain worse. The patient reports that she had benefit from her facet injections in the past. Per chart review, the patient last had facet injection in April 2022. Although, the patient reports that the neuropathic pain in her feet has improved since last visit, though her hands have not gotten better      Last injections on 1/27 and did not really help. Back is in a lot of pain. I used to walk for an hour. Haven't been able to walk much recently. Feel off balance as well. This year has had 5 falls. Can only stand for 5 min    Subjective:  Today:  Last week I had one random day I was very wobbly but doing okay after. Pain in the back has been pretty good but hte hand has bene painfu  Pain:     Current pain rating:  2    At best pain rating:  0    At worst pain rating:  7  Location:  Low back    Quality:  Aching    Relieving factors:  Rest    Aggravating factors:  Bending, lifting, performance of leg dominant activites, rising from sitting, stairs, standing and walking    Pain related Behaviors:  Avoidance    Progression:  No change    Red Flags:  None  Precautions/Equipment   Precautions:  Fall risk    Current Braces/Orthoses:  None    Equipment Currently Used:  None  Prior Functional Status     No physical limitations    Current Functional Status:    limited exercise, limited lifting, limited recreation, limited household activities and limited work capacity  Social Support:   Barriers to Learning:  No Barriers  Diagnostic Tests:     Comments:  Impression  Mild multilevel degenerative changes of the spine with facet arthropathy most prominent at L4-L5 and L5-S1. Demineralized osseous structures.      Treatments:     Previous treatment:  Corticosteroid injection and physical therapy    Current treatment: physical therapy    Patient Goals:     Patient/Family goals for therapy:  Decreased pain, increased ROM, increased strength, independence with ADLs/IADLs, improved ambulation, improved standing tolerance and return to recreational activites      Hunger vital sign:  1. Within the past 12 months, we worried whether our food would run out before we got money to buy more. Never True  2. Within the past 12 months, the food we bought just didn't last, and we didn't have money to get more. Never True    If patient identifies with either of the above, patient was provided with local food resource guide and non perishables when possible.    OBJECTIVE     Outcome Measure: Revised Oswestry: Oswestry Score: 36 / 50 or 72 %     Posture/Observation:   Sitting: slouched and forward head  Correction of Posture: no effect  Standing: unremarkable  Gait: decreased gait speed  forward flexed posture    Lumbar Spine ROM  Motion No ROM Loss  (0%) Min ROM Loss  (1-33%) Mod ROM Loss  (34-65%) Major ROM Loss  (66-100%) Symptoms    Flexion   x  Most Pain   Extension   x  Pain   Sideglide Right   x  Pain   Sideglide Left   x  Pain     Special tests:   Right Left   Slump - -   SLR - -   Babinski - -   SIJ cluster: - -   Femoral nerve - -       NEUROLOGICAL:  MOTOR STRENGTH  Muscle Right Left   Hip Flexion 4-/5 4-/5   Knee extension 4-/5 4-/5   Ankle DF 4-/5 4-/5   Great toe extension 4-/5 4-/5   Ankle PF 4-/5 4-/5     SENSATION - LIGHT TOUCH  Dermatome Right Left   L2 Intact Intact   L3 Intact Intact   L4 Intact Intact   L5 Intact Intact   S1 Intact Intact       Palpation: TTP: Lumbar paraspinals L2-L5    TUG: 19.39 sec  30 sec STS: 6 reps with UE assistance      TREATMENT RENDERED     Interpreter Use: Not applicable    Therapeutic Exercise:  40 Minutes   Performed with direct PT demonstration, instruction, supervision, and guidance.   - Education on condition, prognosis, and PT POC  - HEP review  as below   - LTR  - STS 22 3000g ball   - NuStep seat 10 Arms 12 L4 8 min  - Seated lumbar flexion slides on table  - Seated twists 3000g    Deferred:  - Hip banded marches  - Bridges   - Hooklying banded clamshell GTB  - hip adduction iso  - Step over hurdle  - tandem balance      Manual Therapy: 0 Minutes   -         Next Visit Plan: Progress lumbar mobility and LE strengthening as tolerated      Total Treatment Time: 40 Minutes        Therapeutic Interventions Charges  $$ Therapeutic Exercise [mins]: 40                 I attest that I have reviewed the above information.  Signed: Jami Mcclintock, PT, DPT  01/17/2024 8:56 AM        I reviewed the no-show/attendance policy with the patient and caregiver(s). The patient is aware that they must call to cancel appointments more than 24 hours in advance. They are also aware that if they late cancel or no-show three times, we reserve the right to cancel their remaining appointments. This policy is in place to allow us  to best serve the needs of our caseload.    If patient returns to clinic with variance in plan of care, then it may be attributable to one or more of the following factors: preferred clinician availability, appointment time request availability, therapy pool appointment availability, major holiday with clinic closure, caregiver availability, patient transportation, conflicting medical appointment, inclement weather, and/or patient illness.    If patient does not return for follow up visit(s) related to this episode of care, this note will serve as their discharge note from Physical Therapy.

## 2024-01-18 ENCOUNTER — Ambulatory Visit: Admit: 2024-01-18 | Discharge: 2024-01-18 | Payer: Medicare (Managed Care)

## 2024-01-18 DIAGNOSIS — M12812 Other specific arthropathies, not elsewhere classified, left shoulder: Principal | ICD-10-CM

## 2024-01-18 DIAGNOSIS — E559 Vitamin D deficiency, unspecified: Principal | ICD-10-CM

## 2024-01-18 DIAGNOSIS — M329 Systemic lupus erythematosus, unspecified: Principal | ICD-10-CM

## 2024-01-18 DIAGNOSIS — M12811 Other specific arthropathies, not elsewhere classified, right shoulder: Principal | ICD-10-CM

## 2024-01-18 DIAGNOSIS — M65331 Trigger finger, right middle finger: Principal | ICD-10-CM

## 2024-01-18 LAB — COMPREHENSIVE METABOLIC PANEL
ALBUMIN: 3.8 g/dL (ref 3.4–5.0)
ALKALINE PHOSPHATASE: 106 U/L (ref 46–116)
ALT (SGPT): 14 U/L (ref 10–49)
ANION GAP: 8 mmol/L (ref 5–14)
AST (SGOT): 21 U/L (ref <=34)
BILIRUBIN TOTAL: 0.3 mg/dL (ref 0.3–1.2)
BLOOD UREA NITROGEN: 16 mg/dL (ref 9–23)
BUN / CREAT RATIO: 18
CALCIUM: 9.6 mg/dL (ref 8.7–10.4)
CHLORIDE: 103 mmol/L (ref 98–107)
CO2: 29.8 mmol/L (ref 20.0–31.0)
CREATININE: 0.89 mg/dL (ref 0.55–1.02)
EGFR CKD-EPI (2021) FEMALE: 72 mL/min/1.73m2 (ref >=60)
GLUCOSE RANDOM: 84 mg/dL (ref 70–179)
POTASSIUM: 4.3 mmol/L (ref 3.4–4.8)
PROTEIN TOTAL: 8 g/dL (ref 5.7–8.2)
SODIUM: 141 mmol/L (ref 135–145)

## 2024-01-18 LAB — CBC W/ AUTO DIFF
BASOPHILS ABSOLUTE COUNT: 0.1 10*9/L (ref 0.0–0.1)
BASOPHILS RELATIVE PERCENT: 1.4 %
EOSINOPHILS ABSOLUTE COUNT: 0.1 10*9/L (ref 0.0–0.5)
EOSINOPHILS RELATIVE PERCENT: 3.6 %
HEMATOCRIT: 41.9 % (ref 34.0–44.0)
HEMOGLOBIN: 14.1 g/dL (ref 11.3–14.9)
LYMPHOCYTES ABSOLUTE COUNT: 1.8 10*9/L (ref 1.1–3.6)
LYMPHOCYTES RELATIVE PERCENT: 44.4 %
MEAN CORPUSCULAR HEMOGLOBIN CONC: 33.7 g/dL (ref 32.0–36.0)
MEAN CORPUSCULAR HEMOGLOBIN: 28.4 pg (ref 25.9–32.4)
MEAN CORPUSCULAR VOLUME: 84.3 fL (ref 77.6–95.7)
MEAN PLATELET VOLUME: 7.7 fL (ref 6.8–10.7)
MONOCYTES ABSOLUTE COUNT: 0.6 10*9/L (ref 0.3–0.8)
MONOCYTES RELATIVE PERCENT: 13.8 %
NEUTROPHILS ABSOLUTE COUNT: 1.5 10*9/L — ABNORMAL LOW (ref 1.8–7.8)
NEUTROPHILS RELATIVE PERCENT: 36.8 %
PLATELET COUNT: 285 10*9/L (ref 150–450)
RED BLOOD CELL COUNT: 4.97 10*12/L (ref 3.95–5.13)
RED CELL DISTRIBUTION WIDTH: 13.9 % (ref 12.2–15.2)
WBC ADJUSTED: 4.1 10*9/L (ref 3.6–11.2)

## 2024-01-18 LAB — URINALYSIS WITH MICROSCOPY WITH CULTURE REFLEX PERFORMABLE
BACTERIA: NONE SEEN /HPF
BILIRUBIN UA: NEGATIVE
BLOOD UA: NEGATIVE
GLUCOSE UA: NEGATIVE
KETONES UA: NEGATIVE
NITRITE UA: NEGATIVE
PH UA: 5.5 (ref 5.0–9.0)
RBC UA: <1 /HPF (ref <=4)
SPECIFIC GRAVITY UA: 1.029 (ref 1.003–1.030)
SQUAMOUS EPITHELIAL: <1 /HPF (ref 0–5)
UROBILINOGEN UA: <2
WBC UA: 4 /HPF (ref 0–5)

## 2024-01-18 LAB — PROTEIN / CREATININE RATIO, URINE
CREATININE, URINE: 162.6 mg/dL
PROTEIN URINE: 31.3 mg/dL
PROTEIN/CREAT RATIO, URINE: 0.192

## 2024-01-18 LAB — C4 COMPLEMENT: C4 COMPLEMENT: 22.2 mg/dL (ref 12.0–36.0)

## 2024-01-18 MED ORDER — HYDROXYCHLOROQUINE 200 MG TABLET
ORAL_TABLET | Freq: Two times a day (BID) | ORAL | 3 refills | 90.00000 days | Status: CP
Start: 2024-01-18 — End: ?

## 2024-01-18 MED FILL — MYCOPHENOLATE SODIUM 360 MG TABLET,DELAYED RELEASE: ORAL | 7 days supply | Qty: 30 | Fill #4

## 2024-01-18 NOTE — Unmapped (Signed)
 [facility] Refill Coordination Note    Specialty Medication(s) to be Shipped:   Inflammatory Disorders: mycophenolate     Other medication(s) to be shipped: No additional medications requested for fill at this time     Caitlin Smith, DOB: May 02, 1956  Phone: [phone] (home) [phone] (work)      All above HIPAA information was verified with patient.     Was a Nurse, learning disability used for this call? No    Completed refill call assessment today to schedule patient's medication shipment from the [facility]  [phone]).  All relevant notes have been reviewed.     Specialty medication(s) and dose(s) confirmed: Regimen is correct and unchanged.   Changes to medications: Kelci reports no changes at this time.  Changes to insurance: No  New side effects reported not previously addressed with a pharmacist or physician: None reported  Questions for the pharmacist: No    Confirmed patient received a Conservation officer, historic buildings and a Surveyor, mining with first shipment. The patient will receive a drug information handout for each medication shipped and additional FDA Medication Guides as required.       DISEASE/MEDICATION-SPECIFIC INFORMATION        N/A    SPECIALTY MEDICATION ADHERENCE     Medication Adherence    Patient reported X missed doses in the last month: 0  Specialty Medication: mycophenolate  360 MG Tbec (MYFORTIC )  Patient is on additional specialty medications: No              Were doses missed due to medication being on hold? No      mycophenolate  360 MG Tbec (MYFORTIC ): 5 days of medicine on hand       REFERRAL TO PHARMACIST     Referral to the pharmacist: Not needed      [facility]     Shipping address confirmed in Epic.     Cost and Payment: Patient has a $0 copay, payment information is not required.    Delivery Scheduled: Yes, Expected medication delivery date: 5.21.2025.     Medication will be delivered via Same Day Courier to the prescription address in Epic WAM.    Marcella Serge   [facility]  Specialty Technician

## 2024-01-18 NOTE — Unmapped (Signed)
 Patient Name: Caitlin Smith  PCP: ??Sowles, Clevester Dally, MD  Source of History: patient and records  Date of Visit: 01/18/24 8:58 AM    Chief Compliant: follow-up for SLE and FMS    PRIOR RHEUMATOLOGIC HISTORY:??SLE, OA, and fibromyalgia.  Initially diagnosed in 2006 with SLE presenting with episcleritis and arthralgia with serology showing +ANA, +dsDNA.  She also has stage I CKD felt secondary not due to SLE but due to HTN and obesity. In 04/2016 she was dx with eosinophilic pneumonitis, later evaluation most consistent with organizing pneumonia, following with pulmonology.  Treatment history:  - HCQ  - Previously treated with prednisone  for eosinophilic pneumonitis with difficulty tapering due to pulm symptoms, so cellcept  was added 2018.   - Cellcept  changed to myfortic  2019 due to GI distress. Myfortic  increased from 360 mg po bid to 720 mg po bid in January 2023.  - tapered off prednisone  in late 2019  - Derm eval in 07/2018 consistent with inverse psoriasis, possibly due to prednisone  use. Recommended avoiding systemic steroids when possible. - Myfortic  increased to 720 mg BID in 09/2021 due to worsening ILD.   - Prednisone  (to be taken indefinitely) resumed by pulmonology in 12/2021. Tapered from 10 mg to 7.5 mg daily by Pulm in August 2023. November 2023 prednisone  tapered to 5 mg daily. Ultimately tapered off prednisone  in 06/2023.   - Current med regimen: Myfortic  720 mg twice daily, Plaquenil  200 mg twice daily.  Also followed by pain clinic for pharmacotherapy for fibromyalgia.    HPI: Caitlin Smith is a 68 y.o. female who presents for her follow-up for SLE complicated by eosinophilic pneumonitis vs organizing pneumonia and Inverse psoriasis managed topically as well as underlying fibromyalgia. She was last evaluated by me in September 2024 and saw Fred Jacobsen [facility] in January 2025. She appeared stable at both visits and was advised to taper off prednisone  per pulmonary.  She presents to clinic today in person.    Today, patient reports she had a nice Birthday and Mother's Day. Weight is stable. She reports compliance Myfortic  720 mg po bid and Plaquenil  200 mg bid. Last eye exam in October 2024 at [facility]. Reports DOE. Saw pulmonary in January 2025 virtually. She reports unstable gait with falls and follows with neurology who has her working on her balance. She has neuropathy involving her lower extremities. She reports pain in both shoulders that is a constant aching. She would be interested in therapeutic injection to shoulders.  She also reports trigger finger  involving right 3rd digit. Discussed referral to hand orthopedics. Reports pruritus constantly. No rashes. She is involved in physical therapy.    ROS??: Attests to the above, otherwise, review of all other systems is negative.   ??  Past Medical and Surgical History:  ??  Patient Active Problem List    Diagnosis Date Noted    Lumbar facet arthropathy 10/02/2023    PVC (premature ventricular contraction) 08/03/2022    Immunocompromised (HHS-HCC) 04/29/2021    Otalgia 12/03/2019    Hyperglycemia 10/23/2017    Bronchiolitis obliterans organizing pneumonia   09/29/2016    Vertigo 09/29/2016    Neck pain 12/15/2015    Fibromyalgia 10/12/2015    Chronic constipation 04/11/2015    Chronic recurrent major depressive disorder (CMS-HCC) 04/11/2015    Dyslipidemia 04/11/2015    Obesity, diabetes, and hypertension syndrome   04/11/2015    Intertrigo 04/11/2015    Fibrositis 04/11/2015    Gastroesophageal reflux disease without esophagitis  04/11/2015    Vitamin D deficiency 04/11/2015    Seborrhea capitis 04/11/2015    Perennial allergic rhinitis with seasonal variation 04/11/2015    Moderate dysplasia of vulva 04/11/2015    Spinal stenosis of lumbar region 04/11/2015    Keratosis pilaris 04/11/2015    Insomnia, persistent 04/11/2015    Auditory impairment 04/11/2015    Eczema intertrigo 04/11/2015    Neurogenic claudication 04/11/2015    Screening for cardiovascular condition 03/05/2015    Encounter for screening for cardiovascular disorders 03/05/2015    Treadmill stress test negative for angina pectoris 03/05/2015    Cough 05/20/2014    Shortness of breath 05/20/2014    Regurgitation 04/24/2014    Epigastric burning sensation 04/24/2014    Obesity with body mass index 30 or greater 02/03/2014    S/P total knee replacement 12/31/2013    Asthma (HHS-HCC) 12/31/2013    Asthma, chronic (HHS-HCC) 12/31/2013    H/O total knee replacement 12/31/2013    SLE (systemic lupus erythematosus)   11/28/2013    Episcleritis 09/26/2013    Other diseases of vocal cords 05/28/2013    Vocal cord dysfunction 05/28/2013    Generalized anxiety disorder 03/19/2013    Pain medication agreement signed 02/12/2013    Family history of breast cancer     VIN II (vulvar intraepithelial neoplasia II) 01/25/2013    Back pain 12/18/2012    Multiple pulmonary nodules 11/26/2012    Lung nodule, multiple 11/26/2012    Pulmonary nodules 11/26/2012    Osteoarthrosis 11/01/2012    Osteoarthritis 11/01/2012    Arthritis, degenerative 11/01/2012    Postinflammatory pulmonary fibrosis   10/22/2012    Aspiration pneumonitis   10/22/2012    H/O aspiration pneumonitis 10/22/2012    Mixed urge and stress incontinence 05/04/2012    Dysphonia 03/22/2012    Sensorineural hearing loss 03/22/2012    Myalgia and myositis 02/25/2011    Benign neoplasm of pituitary gland and craniopharyngeal duct (pouch)   12/29/2010    Chronic kidney disease, stage I 12/08/2010    Proteinuria 12/08/2010    Lung involvement in systemic lupus erythematosus   11/05/2010    Chronic nausea 07/01/2008    Hypertension, benign 01/25/2005     Past Surgical History:   Procedure Laterality Date    BRAIN SURGERY      for facial spasms    BREAST BIOPSY Right 2012    Needle bx    CARPAL TUNNEL RELEASE      CHOLECYSTECTOMY      COLPOSCOPY      GALLBLADDER SURGERY      GYNECOLOGIC CRYOSURGERY      HAND SURGERY      HYSTERECTOMY N/A 07/09/2001 Vaginal Hysterectomy with ovaries in place    JOINT REPLACEMENT Right     3 TO 4 YEARS AGO    KNEE ARTHROSCOPY      MYOMECTOMY      PR ANAL PRESSURE RECORD N/A 03/22/2016    Procedure: ANORECTAL MANOMETRY;  Surgeon: Nurse-Based Giproc;  [facility]: [facility];  Service: Gastroenterology    PR BRONCHOSCOPY,DIAGNOSTIC W LAVAGE Bilateral 04/25/2016    Procedure: BRONCHOSCOPY, RIGID OR FLEXIBLE, INCLUDE FLUOROSCOPIC GUIDANCE WHEN PERFORMED; W/BRONCHIAL ALVEOLAR LAVAGE WITH MODERATE SEDATION;  Surgeon: Albin Altes, MD;  Location: BRONCH PROCEDURE LAB [facility];  Service: Pulmonary    PR BRONCHOSCOPY,TRANSBRONCH BIOPSY N/A 04/25/2016    Procedure: BRONCHOSCOPY, RIGID/FLEXIBLE, INCLUDE FLUORO GUIDANCE WHEN PERFORMED; W/TRANSBRONCHIAL LUNG BX, SINGLE LOBE WITH MODERATE SEDATION;  Surgeon: Albin Altes, MD;  Location: BRONCH PROCEDURE LAB [facility];  Service: Pulmonary    PR COLON CA SCRN NOT HI RSK IND  08/22/2013    Procedure: COLOREC CNCR SCR;COLNSCPY NO;  Surgeon: Marya Smack, MD;  Location: [facility];  Service: Gastroenterology    PR COLONOSCOPY FLX DX W/COLLJ SPEC WHEN PFRMD N/A 10/12/2021    Procedure: COLONOSCOPY, FLEXIBLE, PROXIMAL TO SPLENIC FLEXURE; DIAGNOSTIC, W/WO COLLECTION SPECIMEN BY BRUSH OR WASH;  Surgeon: Dionicia Frater, MD;  Location: [facility];  Service: Gastroenterology    PR COLSC FLX W/RMVL OF TUMOR POLYP LESION SNARE TQ N/A 10/12/2021    Procedure: COLONOSCOPY FLEX; W/REMOV TUMOR/LES BY SNARE;  Surgeon: Dionicia Frater, MD;  Location: [facility];  Service: Gastroenterology    PR GERD TST W/ MUCOS IMPEDE ELECTROD,>1HR N/A 05/26/2014    Procedure: ESOPHAGEAL FUNCTION TEST, GASTROESOPHAGEAL REFLUX TEST W/ NASAL CATHETER INTRALUMINAL IMPEDANCE ELECTRODE(S) PLACEMENT, RECORDING, ANALYSIS AND INTERPRETATION; PROLONGED;  Surgeon: Nurse-Based Giproc;  [facility]: [facility];  Service: Gastroenterology PR TOTAL KNEE ARTHROPLASTY Right 12/31/2013    Procedure: ARTHROPLASTY, KNEE, CONDYLE & PLATEAU; MEDIAL & LAT COMPARTMENT W/WO PATELLA RESURFACE (TOTAL KNEE ARTHROP);  Surgeon: Suezanne Emperor, MD;  Location: MAIN OR [facility];  Service: Orthopedics    PR UPPER GI ENDOSCOPY,BIOPSY N/A 11/07/2014    Procedure: UGI ENDOSCOPY; WITH BIOPSY, SINGLE OR MULTIPLE;  Surgeon: Dufm Gibbon, MD;  Location: [facility];  Service: Gastroenterology    SKIN BIOPSY      SPINAL FUSION      wide local excision of vulva Right      Allergies:   ??  Allergies   Allergen Reactions    Vilazodone Swelling    Ace Inhibitors Other (See Comments)     Pt states she can not take ace inhibitors. Pt states she can not remember her reaction.     Bee Pollen Itching     COUGH, WATERY EYES    Penicillin G Rash    Penicillins Rash    Pollen Extracts Itching     COUGHING, WATERY EYES     Current Outpatient Medications:  ??  Current Outpatient Medications on File Prior to Visit   Medication Sig Dispense Refill    albuterol  HFA 90 mcg/actuation inhaler Inhale 2 puffs every six (6) hours as needed for wheezing. 8 g 11    amlodipine  (NORVASC ) 2.5 MG tablet Take 3 tablets (7.5 mg total) by mouth daily. 90 tablet 3    atenolol  (TENORMIN ) 25 MG tablet Take 1.5 tablets (37.5 mg total) by mouth two (2) times a day. 300 tablet 3    atorvastatin (LIPITOR) 40 MG tablet Take 1 tablet (40 mg total) by mouth daily.      biotin 1 mg cap Take by mouth.      buPROPion  (WELLBUTRIN  XL) 300 MG 24 hr tablet TAKE 1 TABLET BY MOUTH EVERY DAY 90 tablet 3    carboxymethylcellulose sodium (ARTIFICIAL TEARS, C[facility],) 1 % Drop Apply to eye.      clonazePAM  (KLONOPIN ) 0.5 MG tablet Take 0.5 tablets (0.25 mg total) by mouth Three (3) times a day as needed for anxiety. 30 tablet 1    clotrimazole -betamethasone  (LOTRISONE ) 1-0.05 % cream Apply 1 Application topically two (2) times a day.      cyclobenzaprine  (FLEXERIL ) 5 MG tablet Take 1 tablet (5 mg total) by mouth two (2) times a day as needed for muscle spasms. 120 tablet 1  diclofenac  sodium (VOLTAREN ) 1 % gel Apply 2 g topically two (2) times a day as needed for arthritis. 300 g 5    dicyclomine (BENTYL) 20 mg tablet Take 1 tablet (20 mg total) by mouth daily as needed.      famotidine  (PEPCID ) 20 MG tablet TAKE 1 TABLET BY MOUTH TWICE  DAILY 200 tablet 2    fluticasone  propionate (FLONASE ) 50 mcg/actuation nasal spray       hydrALAZINE  (APRESOLINE ) 10 MG tablet       hydroxychloroquine  (PLAQUENIL ) 200 mg tablet Take 1 tablet (200 mg total) by mouth two (2) times a day. 180 tablet 3    hydrOXYzine (ATARAX) 10 MG tablet       inhalational spacing device (AEROCHAMBER MV) Spcr 1 each by Miscellaneous route two (2) times a day. With Fovent 1 each 0    lamoTRIgine  (LAMICTAL ) 200 MG tablet TAKE 1 TABLET BY MOUTH TWICE  DAILY 200 tablet 2    lidocaine  (XYLOCAINE ) 5 % ointment Apply topically four (4) times a day. 480 g 0    linaclotide  (LINZESS ) 290 mcg capsule TAKE 1 CAPSULE BY MOUTH DAILY. 30 capsule 3    loratadine (CLARITIN) 10 mg tablet Take 1 tablet (10 mg total) by mouth daily.      melatonin 10 mg Tab Take 1 tablet by mouth.      metFORMIN (GLUCOPHAGE-XR) 500 MG 24 hr tablet Take 1 tablet (500 mg total) by mouth daily before breakfast.      mycophenolate  (MYFORTIC ) 360 MG TbEC Take 2 tablets (720 mg total) by mouth two (2) times a day. 360 tablet 3    omeprazole  (PRILOSEC) 40 MG capsule Take 1 capsule (40 mg total) by mouth two (2) times a day. 180 capsule 3    ondansetron  (ZOFRAN ) 4 MG tablet       polyethylene glycol (GLYCOLAX ) 17 gram/dose powder Take 17 g by mouth daily.      pregabalin  (LYRICA ) 75 MG capsule Take 1 capsule (75 mg total) by mouth two (2) times a day. 120 capsule 1    telmisartan  (MICARDIS ) 80 MG tablet Take 1 tablet (80 mg total) by mouth daily. 90 tablet 3    traZODone  (DESYREL ) 100 MG tablet TAKE 1 AND 1/2 TABLETS BY MOUTH  EVERY NIGHT 135 tablet 3     No current facility-administered medications on file prior to visit.     Immunization History   Administered Date(s) Administered    COVID-19 VAC,BIVALENT,MODERNA(BLUE CAP) 12/28/2021    COVID-19 VACCINE,MRNA(MODERNA)(PF) 11/20/2019, 12/23/2019, 08/25/2020, 12/31/2020, 04/29/2021    Covid-19 Vac, (70yr+) (Comirnaty) Mrna Pfizer  05/18/2023    Covid-19 Vac, (65yr+) (Spikevax) Monovalent Moderna 07/14/2022    HEPATITIS B VACCINE ADULT,IM(ENERGIX B, RECOMBIVAX) 06/26/2007, 08/12/2015, 10/13/2015, 02/10/2016    Hepatitis A (Adult) 08/12/2015, 02/10/2016    INFLUENZA TIV (TRI) 11MO+ W/ PRESERV (IM) 06/01/2010, 05/14/2013, 04/19/2014    INFLUENZA TIV (TRI) PF (IM)(HISTORICAL) 06/26/2007, 06/02/2011, 12/01/2011, 06/09/2013    INFLUENZA VACCINE IIV3(IM)(PF)6 MOS UP 06/02/2011, 05/18/2023    Influenza Vaccine Quad(IM)6 MO-Adult(PF) 04/16/2015, 05/26/2016, 05/17/2017, 05/14/2018, 06/12/2019, 06/15/2021, 06/06/2022    Influenza Virus Vaccine, unspecified formulation 06/05/2020    MMR 06/26/2007    Measles / Rubella 10/05/1987    PNEUMOCOCCAL POLYSACCHARIDE 23-VALENT 08/04/2016    Pneumococcal Conjugate 13-Valent 08/06/2015, 05/26/2016    Pneumococcal Conjugate 20-valent 09/30/2021    SHINGRIX-ZOSTER VACCINE (HZV),RECOMBINANT,ADJUVANTED(IM) 12/28/2021, 03/15/2022    TdaP 06/26/2007, 07/14/2021     ??  PHYSICAL EXAM? - exam not significantly changed.  Vital  signs: BP 109/59 (BP Site: L Arm, BP Cuff Size: Large)  - Pulse 65  - Temp 36.1 ??C (97 ??F) (Temporal)  - Wt 86.2 kg (190 lb)  - BMI 29.76 kg/m?? Body mass index is 29.76 kg/m??.  Gen: Well-developed, well-nourished adult in no apparent distress. Normocephalic with no external signs of trauma. Pleasant and cooperative. AOx4.?  HEENT: PERRLA, EOMI, MMM, oropharynx in pink without ulcerations or thrush.   Lungs: Broad chest excursion with good air movement. CTAB without wheezing/rhonchi/rales. ??  CV: RRR, Normal S1/S2, No murmurs/rubs/gallops Smith. ?  PV: Warm, 2+ radial and pedal pulses, no C/C/E.??  Neuro: Good comprehension/cognition. CN 2-12 intact.  Muscle strength 5/5 in all extremities. Gait normal.?? Numbness in feet.   Comprehensive Musculoskeletal Examination:??  Jaw, neck without limited ROM.??  Shoulders, elbows, wrists, hands, fingers:  No deformity, erythema, warmth, swelling, effusion, limited ROM.?? Reports triggering of right 3rd digit but did not fully elicit on exam.   Hips without limited ROM.??  Knee, ankles, feet, toes: Crepitus in left knee and notes pain with ROM, slightly limited. S/p R TKR.  Ankles and feet without swelling or limited ROM. Notes numbness in feet.   Reports tenderness at trapezius, diffusely over both shoulders, bilateral hips and pes anserine.  Skin: No rashes n    LABORATORY - monitoring labs completed at visit with results returned not suggestive for increased lupus activity. C3 and dsDNA pending  Recent Results (from the past week)   Protein/Creatinine Ratio, Urine    Collection Time: 01/18/24  9:34 AM   Result Value Ref Range    Creat U 162.6 Undefined mg/dL    Protein, Ur 13.0 Undefined mg/dL    Protein/Creatinine Ratio, Urine 0.192 Undefined   Urinalysis with Microscopy with Culture Reflex    Collection Time: 01/18/24  9:34 AM   Result Value Ref Range    Color, UA Yellow     Clarity, UA Clear     Specific Gravity, UA 1.029 1.003 - 1.030    pH, UA 5.5 5.0 - 9.0    Leukocyte Esterase, UA Small (A) Negative    Nitrite, UA Negative Negative    Protein, UA Trace (A) Negative    Glucose, UA Negative Negative    Ketones, UA Negative Negative    Urobilinogen, UA <2.0 mg/dL <8.6 mg/dL    Bilirubin, UA Negative Negative    Blood, UA Negative Negative    RBC, UA <1 <=4 /HPF    WBC, UA 4 0 - 5 /HPF    Squam Epithel, UA <1 0 - 5 /HPF    Bacteria, UA None Seen None Seen /HPF    Mucus, UA Rare (A) None Seen /HPF   C4 complement    Collection Time: 01/18/24  9:45 AM   Result Value Ref Range    C4 Complement 22.2 12.0 - 36.0 mg/dL   Comprehensive Metabolic Panel    Collection Time: 01/18/24 9:45 AM   Result Value Ref Range    Sodium 141 135 - 145 mmol/L    Potassium 4.3 3.4 - 4.8 mmol/L    Chloride 103 98 - 107 mmol/L    CO2 29.8 20.0 - 31.0 mmol/L    Anion Gap 8 5 - 14 mmol/L    BUN 16 9 - 23 mg/dL    Creatinine 5.78 4.69 - 1.02 mg/dL    BUN/Creatinine Ratio 18     eGFR CKD-EPI (2021) Female 72 >=60 mL/min/1.12m2    Glucose 84 70 - 179 mg/dL  Calcium 9.6 8.7 - 10.4 mg/dL    Albumin 3.8 3.4 - 5.0 g/dL    Total Protein 8.0 5.7 - 8.2 g/dL    Total Bilirubin 0.3 0.3 - 1.2 mg/dL    AST 21 <=16 U/L    ALT 14 10 - 49 U/L    Alkaline Phosphatase 106 46 - 116 U/L   CBC w/ Differential    Collection Time: 01/18/24  9:45 AM   Result Value Ref Range    WBC 4.1 3.6 - 11.2 10*9/L    RBC 4.97 3.95 - 5.13 10*12/L    HGB 14.1 11.3 - 14.9 g/dL    HCT 10.9 60.4 - 54.0 %    MCV 84.3 77.6 - 95.7 fL    MCH 28.4 25.9 - 32.4 pg    MCHC 33.7 32.0 - 36.0 g/dL    RDW 98.1 19.1 - 47.8 %    MPV 7.7 6.8 - 10.7 fL    Platelet 285 150 - 450 10*9/L    Neutrophils % 36.8 %    Lymphocytes % 44.4 %    Monocytes % 13.8 %    Eosinophils % 3.6 %    Basophils % 1.4 %    Absolute Neutrophils 1.5 (L) 1.8 - 7.8 10*9/L    Absolute Lymphocytes 1.8 1.1 - 3.6 10*9/L    Absolute Monocytes 0.6 0.3 - 0.8 10*9/L    Absolute Eosinophils 0.1 0.0 - 0.5 10*9/L    Absolute Basophils 0.1 0.0 - 0.1 10*9/L       ??GENERAL SUMMARY AND IMPRESSION: ??  ??  In summary, the patient is a 68 y.o. female with SLE complicated by eosinophilic pneumonitis vs organizing pneumonia and Inverse psoriasis managed topically as well as underlying fibromyalgia. Exam not significantly changed from prior. Reporting multiple joint pain with full ROM of joints and mild tenderness on exam. Recommend physical therapy and topical diclofenac  gel for management. Deferred therapeutic injection at this time. Reporting right 3rd finger triggering although unable to elicit on exam. Referral to hand orthopedics for further evaluation and management. Advised her to continue Myfortic  720 mg po bid and hydroxychloroquine  200 mg po bid, and maintain eye exam. Lab monitoring for disease activity without any specific concerns. Follow-up in 5 months with Fred Jacobsen [facility] and 10 months with myself.  Return in about 10 months (around 11/17/2024).    RECOMMENDATIONS: ??  ????   Diagnosis ICD-10-CM Associated Orders   1. Systemic lupus erythematosus, unspecified SLE type, unspecified organ involvement status    M32.9 hydroxychloroquine  (PLAQUENIL ) 200 mg tablet     CBC w/ Differential     Urinalysis with Microscopy with Culture Reflex     Protein/Creatinine Ratio, Urine     Anti-DNA antibody, double-stranded     C3 complement     C4 complement     Extractable Nuclear Antigen     Comprehensive Metabolic Panel     25 OH Vit D     Serum Protein Electrophoresis and Immunofixation     Serum Free Light Chains     Urine Culture     Total Protein for SPE      2. Trigger middle finger of right hand  M65.331 Ambulatory referral to Orthopedic Surgery      3. Rotator cuff arthropathy of both shoulders  M12.811 Ambulatory referral to Physical Therapy    M12.812       4. Vitamin D deficiency, unspecified  E55.9 25 OH Vit D  Patient Instructions   Referral to hand orthopedics for trigger finger    Referral to physical therapy at [facility] for shoulders    Lupus appears stable    Labs and urine today    Voltaren  (Diclofenac ) gel is over the counter  The patient indicates understanding of these issues and agrees to the plan as outlined above.  Contact information provided for any concerns or questions in the interim.    I personally spent 35 minutes face-to-face and non-face-to-face in the care of this patient, which includes all pre, intra, and post visit time on the date of service.  All documented time was specific to the E/M visit and does not include any procedures that may have been performed.    Roslin Norwood C. Yevonne Heman, MD, PhD  Assistant Professor of Medicine  Department of Medicine/Division of Rheumatology  [facility]  9:53 PM

## 2024-01-18 NOTE — Unmapped (Addendum)
 Referral to hand orthopedics for trigger finger    Referral to physical therapy at [facility] for shoulders    Lupus appears stable    Labs and urine today    Voltaren  (Diclofenac ) gel is over the counter

## 2024-01-19 LAB — SERUM FREE LIGHT CHAINS
K/L FLC RATIO: 1.74 — ABNORMAL HIGH (ref 0.26–1.65)
KAPPA FREE,SERUM: 3.61 mg/dL — ABNORMAL HIGH (ref 0.33–1.94)
LAMBDA FREE, SER: 2.07 mg/dL (ref 0.57–2.63)

## 2024-01-19 LAB — SERUM PROTEIN ELECTROPHORESIS AND IMMUNOFIXATION
ALBUMIN (SPE): 4 g/dL (ref 3.5–5.0)
ALPHA-1 GLOBULIN: 0.3 g/dL (ref 0.2–0.5)
ALPHA-2 GLOBULIN: 0.7 g/dL (ref 0.5–1.1)
BETA-1 GLOBULIN: 0.5 g/dL (ref 0.3–0.6)
BETA-2 GLOBULIN: 0.4 g/dL (ref 0.2–0.6)
GAMMAGLOBULIN: 1.7 g/dL — ABNORMAL HIGH (ref 0.5–1.5)
PROTEIN TOTAL (SPECIAL CHEM): 7.6 g/dL

## 2024-01-19 LAB — ANTI-DNA ANTIBODY, DOUBLE-STRANDED
DSDNA AB TITER: 1:20 {titer}
DSDNA ANTIBODY: POSITIVE — AB

## 2024-01-22 LAB — C3 COMPLEMENT: COMPLEMENT C3, S: 111 mg/dL

## 2024-01-22 LAB — EXTRACTABLE NUCLEAR ANTIGEN: ENA SCREEN: 0.5 ENA Units (ref <0.70)

## 2024-01-23 LAB — VITAMIN D 25 HYDROXY: VITAMIN D, TOTAL (25OH): 59.1 ng/mL (ref 20.0–80.0)

## 2024-01-24 MED FILL — MYCOPHENOLATE SODIUM 360 MG TABLET,DELAYED RELEASE: ORAL | 23 days supply | Qty: 90 | Fill #5

## 2024-01-31 ENCOUNTER — Encounter: Admit: 2024-01-31 | Payer: Medicare (Managed Care)

## 2024-01-31 DIAGNOSIS — R490 Dysphonia: Principal | ICD-10-CM

## 2024-01-31 NOTE — Unmapped (Signed)
 [facility] [address]  OUTPATIENT SPEECH PATHOLOGY  01/31/2024      Patient Name: Caitlin Smith  Date of Birth:07/19/1956  Session Number: 5  Diagnosis:   Encounter Diagnosis   Name Primary?    Dysphonia Yes           Date of Evaluation: 10/10/23  Date of Symptom Onset: 10/09/98  Referred by: Eather Golder, MD         Dates of Certification: expires 02/07/2024    Chief Complaint: chronic cough; dysphonia    Note Type: Treatment Note    ASSESSMENT:     Next Visit Plan: abdominal support of phonation, resonant voice, balanced phonation, conversation training    OBJECTIVE:  Today's session focused on instruction of voice therapy concepts and exercises targeting abdominal support of phonation, resonant voice, and respiratory retraining for cough suppression. Patient was ~90% accurate for relaxed throat breathing given max fading to min cues, model, and explanation from clinician. Patient successfully utilized the breathing technique to suppress cough x 1 during today's session. Patient was 100% accurate for adequate breath support during volume manipulation of voiceless fricative /sh/ given model and explanation from clinician. Patient was ~80% accurate for adequate breath support with forward resonance at the level of spontaneous speech; patient reported that she is satisfied with her current vocal function.     Stimulability: Pt was very stimulable  Treatment Recommendations: Continue Treatment    PLAN:  SLP Follow-up / Frequency: 1x week for Planned Treatment Duration : 1 session per week-every other week x9 over 3 months      Planned Interventions: Conversational Coaching, Cough therapy, Diaphragmatic breathing/respiratory retraining, Resonant Voice Therapy Semi-occluded Vocal Tract Exercises, Financial controller, Functional Phonation Tasks    Prognosis:  Good    Negative Prognosis Rationale: Time post onset, Severity of deficits       Positive Prognosis Rationale: Motivation, Response to trial treatment      Goals:      STG 1: Breathing: Pt. will demonstrate abdominal breathing for support of phonation with 80% accuracy given minimal cues    STG 2: VCD/Cough: Pt. will minimize or eliminate cough and/or Vocal Cord Dysfunction by implementing learned strategies with 100% accuracy given minimal cues.    STG 3: Resonance: Pt will balance oral/nasal resonance to maximize vocal output with minimal effort/laryngeal hyperfunction with 80% accuracy given minimal cues    STG 4: Strength/balance: Pt will use balance of vocal fold adduction and airflow to produce voice in structured exercises and spontaneous speech tasks with 80% accuracy given minimal cues    STG 5: Inflection: Pt will vary pitch and/or loudness to demonstrate phonatory flexibility and laryngeal control without laryngeal hyperfunction in sustained phonation exercises and conversational speech with 80% accuracy given minimal cues    Time Frame: 3  Duration: months    LTG #1: At the completion of Tx, Pt will demonstrate ability to perform voice therapy techniques independently to maintain optimal laryngeal function (+/-)    Time Frame: 3  Duration: months       SUBJECTIVE:  Patient returns with report that her voice related concerns have improved since our last meeting. She continues to struggle with her cough.  Pain?: No      Precautions: None      Education Provided: Patient, SLP Plan of Care, Strategies to maximize voicing, Compensatory VCD/Cough Strategies    Response to Education: Understanding demonstrated          Session Duration : 20  Today's Charges (noted here with $$):  SLP Treatment Charges  $$ 92507-Treatment S/L/V - individual [mins]: 20                   I attest that I have reviewed the above information.  Signed: Mammie Sears, SLP  01/31/2024 1:26 PM        The patient reports they are physically located in [place]  and is currently: at home. I conducted a audio/video visit. I spent  94m 50s on the video call with the patient. I spent an additional 5 minutes on pre- and post-visit activities on the date of service .

## 2024-02-01 DIAGNOSIS — R2689 Other abnormalities of gait and mobility: Secondary | ICD-10-CM | POA: Insufficient documentation

## 2024-02-01 DIAGNOSIS — E1142 Type 2 diabetes mellitus with diabetic polyneuropathy: Secondary | ICD-10-CM | POA: Insufficient documentation

## 2024-02-01 NOTE — Unmapped (Signed)
 [facility] PT [facility]  OUTPATIENT PHYSICAL THERAPY  02/01/2024  Note Type: Discharge Note       Patient Name: Caitlin Smith  Date of Birth:Oct 25, 1955  Diagnosis:   Encounter Diagnoses   Name Primary?    Balance problem     Diabetic polyneuropathy associated with type 2 diabetes mellitus       Chronic pain syndrome Yes    Fibromyalgia          Referring Provider: Referring, Unknown Per *    Date of Onset of Impairment: 11/23/2022  Date PT Care Plan Established or Reviewed: 12/05/2023  Date PT Treatment Started: 11/23/2023     Plan of Care Effective Date: 11/23/2023 - 02/01/2024  Session Number:  7     ASSESSMENT & PLAN   Assessment  Assessment details:    Today: Caitlin Smith is a pleasant 68 y.o. female who presents for Physical Therapy Evaluation with Chronic low back pain in setting of DDD and multiple joint pain due to fibromyalgia ongoing > 1 year. Patient has completed 7 visits of skilled PT services with good results. Patient having significant decrease in low back pain and able to ambulate without being a fall risk. Patient educated on maintaining HEP to continue to progress. Patient will return at end of June for PT evaluation of Bilateral shoulders, and PT will check in on back and balance.    Evaluation: Caitlin Smith is a pleasant 68 y.o. female who presents for Physical Therapy Evaluation with Chronic low back pain in setting of DDD and multiple joint pain due to fibromyalgia ongoing > 1 year. Primary impairments include difficulty with prolonged ambulation > 5 min, bending, lifting, twisting, sit to stand transfers, prolonged standing, and household chores. Clinical presentation today is consistent with low back pain due to DDD and multiple joint pain due to fibromyalgia. Patient has a history of chronic pain related to lupus and arthritis and is on rheumatological therapy The patient will benefit from skilled Physical Therapy intervention to address the moderate impairments listed below and to assist the patient in maximizing her functional independence and safe return to prior level of function.         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 (pouch) (CMS-HCC) Chronic kidney disease, stage I Dysphonia Mixed urge and stress incontinence Myalgia and myositis Osteoarthrosis Proteinuria Sensorineural hearing loss VIN II (vulvar intraepithelial neoplasia II) Family history of breast cancer Pain medication agreement signed Episcleritis SLE (systemic lupus erythematosus) (CMS-HCC) S/P total knee replacement Asthma Postinflammatory pulmonary fibrosis (CMS-HCC)    Examination of Body Systems: musculoskeletal, activity/participation and communication    Clinical Decision Making: complex and moderate    Negative Prognosis Rationale: medical status/condition, chronicity of condition, severity of symptoms, ADL performance, endurance and strength.    Clinical Presentation: stable    Therapy Goals      Goals:        1. In 12 weeks the patient will demonstrate independent performance of HEP to maintain functional gains. - Met  2. In 12 weeks: Pt. will have improved Lower extremity strength of 4+/5 in order to perform her house chores and ambulate in the community safely. - Met  3. In 12 weeks the patient will score a 10 point change on the ODI to demonstrate the  MDC (0-100%; lower score indicates a lesser level of disability) and to indicate improved activity tolerance. - Met  4. In 12 weeks the patient will complete an additional 3 STS transfers during the 30s chair stand test to met the MDC and to demonstrate increased functional leg strength. - Met        Plan    Therapy options: will be seen for skilled physical therapy services    Planned therapy interventions: Balance Training, Education - Patient, Endurance Activites, Functional Mobility, Home Exercise Program, Education - Family/Caregiver, E-Stim, [employer], Civil engineer, contracting, Diaphragmatic/Pursed-lip Breathing, 97113-Aquatic Therapy, 97112-Neuromuscular Re-education, 97110-Therapeutic Exercises, 97116-Gait Training, 97140-Manual Therapy, 97530-Therapeutic Activities, 97535-Self-Care/Home Training, 97750-Physical Performance Test and 56213, 20561-Dry Needling 1-2, 3+ areas    DME Equipment: [employer].    Frequency: 1x week    Duration in weeks: 12    Education provided to: patient.    Education provided: HEP, Treatment options and plan, Symptom management, Safety education, Importance of Therapy, Anatomy, Body mechanics, Role of therapy in Rehabilitation, Posture, Community resources and Body awareness    Education results: verbalized good understanding, demonstrates understanding and needs reinforcement.    Communication/Consultation: N/A.      Total Session Time: 45    Treatment rendered today:      45        SUBJECTIVE         History of Present Condition     Date of onset:  11/23/2022 (Chronic)    History of Present Condition/Chief Complaint:    Associated Diagnoses    Chronic pain syndrome [G89.4]    Fibromyalgia [M79.7]    Spinal stenosis of lumbar region, unspecified whether neurogenic claudication present [M48.061]      Reason for Exam    Comments: Please assist with patient with using a TENs unit and work on myofascial release techniques for pain in the back        Evaluation: chronic pain localized to right knee and bilateral neck, shoulders and upper back myofascial pain and fibromyalgia, and lower back 2/2 lumbar facet arthropathy and DDD and moderate degree of lumbar central spinal canal stenosis. Patient has a history of chronic pain related to lupus and arthritis and is on rheumatological therapy.     worsened low back pain. The patient states that the pain feels like her pain goes all the way up her back into her shoulders. The patient confirms that trying to get up and standing makes her pain worse. The patient reports that she had benefit from her facet injections in the past. Per chart review, the patient last had facet injection in April 2022. Although, the patient reports that the neuropathic pain in her feet has improved since last visit, though her hands have not gotten better      Last injections on 1/27 and did not really help. Back is in a lot of pain. I used to walk for an hour. Haven't been able to walk much recently. Feel off balance as well. This year has had 5 falls. Can only stand for 5 min    Subjective:  Today: A little this morning in the legs. Back has been doing okay.  Pain:     Current pain rating:  2    At best pain rating:  0    At worst pain rating:  7  Location:  Low back    Quality:  Aching    Relieving factors:  Rest    Aggravating factors:  Bending, lifting, performance of leg  dominant activites, rising from sitting, stairs, standing and walking    Pain related Behaviors:  Avoidance    Progression:  No change    Red Flags:  None  Precautions/Equipment   Precautions:  Fall risk    Current Braces/Orthoses:  None    Equipment Currently Used:  None  Prior Functional Status     No physical limitations    Current Functional Status:    limited exercise, limited lifting, limited recreation, limited household activities and limited work capacity  Social Support:   Barriers to Learning:  No Barriers  Diagnostic Tests:     Comments:  Impression  Mild multilevel degenerative changes of the spine with facet arthropathy most prominent at L4-L5 and L5-S1. Demineralized osseous structures.      Treatments:     Previous treatment:  Corticosteroid injection and physical therapy    Current treatment: physical therapy    Patient Goals:     Patient/Family goals for therapy:  Decreased pain, increased ROM, increased strength, independence with ADLs/IADLs, improved ambulation, improved standing tolerance and return to recreational activites      Hunger vital sign:  1. Within the past 12 months, we worried whether our food would run out before we got money to buy more. Never True  2. Within the past 12 months, the food we bought just didn't last, and we didn't have money to get more. Never True    If patient identifies with either of the above, patient was provided with local food resource guide and non perishables when possible.    OBJECTIVE     Outcome Measure: Revised Oswestry: Oswestry Score: 36 / 50 or 72 %     Posture/Observation:   Sitting: slouched and forward head  Correction of Posture: no effect  Standing: unremarkable  Gait: decreased gait speed  forward flexed posture    Lumbar Spine ROM  Motion No ROM Loss  (0%) Min ROM Loss  (1-33%) Mod ROM Loss  (34-65%) Major ROM Loss  (66-100%) Symptoms    Flexion   x  Most Pain   Extension   x  Pain   Sideglide Right   x  Pain   Sideglide Left   x  Pain     Special tests:   Right Left   Slump - -   SLR - -   Babinski - -   SIJ cluster: - -   Femoral nerve - -       NEUROLOGICAL:  MOTOR STRENGTH  Muscle Right Left   Hip Flexion 4-/5 4-/5   Knee extension 4-/5 4-/5   Ankle DF 4-/5 4-/5   Great toe extension 4-/5 4-/5   Ankle PF 4-/5 4-/5     SENSATION - LIGHT TOUCH  Dermatome Right Left   L2 Intact Intact   L3 Intact Intact   L4 Intact Intact   L5 Intact Intact   S1 Intact Intact       Palpation: TTP: Lumbar paraspinals L2-L5    TUG: 19.39 sec  30 sec STS: 6 reps with UE assistance    Berg Balance Score: 52 / 56 = 92.9 %   Functional Gait Assessment: 25 / 30 = 83.3 %       TREATMENT RENDERED     Interpreter Use: Not applicable    Therapeutic Exercise:  40 Minutes   Performed with direct PT demonstration, instruction, supervision, and guidance.   - Education on condition, prognosis, and PT POC  - HEP review as below   -  LTR  - STS 22 3000g ball   - NuStep seat 10 Arms 12 L4 8 min  - Seated lumbar flexion slides on table  - Seated twists 3000g  - tandem balance    Deferred:  - Hip banded marches  - Bridges   - Hooklying banded clamshell GTB  - hip adduction iso  - Step over hurdle      Manual Therapy: 0 Minutes   -         Next Visit Plan: Progress lumbar mobility and LE strengthening as tolerated      Total Treatment Time: 40 Minutes        Therapeutic Interventions Charges  $$ Therapeutic Exercise [mins]: 40                 I attest that I have reviewed the above information.  Signed: Jami Mcclintock, PT, DPT  02/01/2024 10:38 AM        I reviewed the no-show/attendance policy with the patient and caregiver(s). The patient is aware that they must call to cancel appointments more than 24 hours in advance. They are also aware that if they late cancel or no-show three times, we reserve the right to cancel their remaining appointments. This policy is in place to allow us  to best serve the needs of our caseload.    If patient returns to clinic with variance in plan of care, then it may be attributable to one or more of the following factors: preferred clinician availability, appointment time request availability, therapy pool appointment availability, major holiday with clinic closure, caregiver availability, patient transportation, conflicting medical appointment, inclement weather, and/or patient illness.    If patient does not return for follow up visit(s) related to this episode of care, this note will serve as their discharge note from Physical Therapy.

## 2024-02-04 NOTE — Unmapped (Signed)
 ORTHOPAEDIC RETURN CLINIC NOTE     ASSESSMENT:  Caitlin Smith is a 68 y.o. female with Right small finger proximal phalanx base fracture treated nonoperatively    Presenting for new onset right middle finger trigger finger    PLAN:  We had a good discussion with the patient regarding her symptoms and management options.  She has symptoms consistent with a right middle finger trigger finger that has been going on for 2 months now.  It is quite bothersome for her.  She is interested in avoiding surgery if at all possible.  We counseled her on the natural history of trigger finger and treatment options including surgical release as well as right middle finger trigger finger injection.  She would like to proceed with injection today.  Please see below procedure note.  She tolerated a right middle finger trigger finger injection well.  We will see her back on an as needed basis.      PROCEDURES:  Trigger finger injection: After informed consent was obtained and a timeout was  performed, the patient's right middle finger trigger finger was injected under sterile conditions using a mixture of 1 mL of 40mg /mL of Kenalog  and 1 mL of 2% Lidocaine  without Epinephrine.  The patient tolerated the injection well.    CHIEF COMPLAINT: Right middle finger trigger finger    INTERIM HISTORY:  Patient returns for evaluation of right middle finger catching and locking that has been going on for the past 2 months.  She states that occasionally the finger does lock and requires passive correction.  This is quite bothersome for the patient and painful when it occurs on a daily basis.  She has never had any trigger fingers before.  She is interested in discussing treatment options.    OBJECTIVE:  Physical Examination:    General  Well nourished, appearing stated age   Not in acute distress   Alert and oriented x3  Appropriate affect and mood  Appropriate to conversation   No increased work of breathing on room air    Musculoskeletal  Right Upper Extremity:  Skin  -appears pink and well perfused with no evidence of trauma.   ROM  -active ROM fingers, wrist, elbow is painless.  Tender to palpation over the right middle finger A1 pulley with pain with attempted flexion extension.  Inspection/Palpation  - Tender to palpation over the right middle finger A1 pulley, otherwise no tenderness to palpation  Motor/Strength  -Wrist extension, wrist flexion, IO, grip strength 5/5  Sensory  -sensation to light touch intact in median, radial and ulnar nerve distributions  Stability  - No gross instability noted at the elbow, wrist, or fingers  Vascular  -fingers are warm, with brisk capillary refill  Provocative testing  -Median nerve provocative testing (Durkin's and Tinel's) negative at the carpal tunnel        IMAGING:  None today

## 2024-02-05 ENCOUNTER — Ambulatory Visit
Admit: 2024-02-05 | Discharge: 2024-02-06 | Payer: Medicare (Managed Care) | Attending: Orthopaedic Surgery | Primary: Orthopaedic Surgery

## 2024-02-05 MED ADMIN — triamcinolone acetonide (KENALOG-40) injection 20 mg: 20 mg | INTRA_ARTICULAR | @ 14:00:00 | Stop: 2024-02-05

## 2024-02-06 ENCOUNTER — Ambulatory Visit
Admit: 2024-02-06 | Discharge: 2024-02-07 | Payer: Medicare (Managed Care) | Attending: Student in an Organized Health Care Education/Training Program | Primary: Student in an Organized Health Care Education/Training Program

## 2024-02-06 DIAGNOSIS — G609 Hereditary and idiopathic neuropathy, unspecified: Principal | ICD-10-CM

## 2024-02-06 DIAGNOSIS — G44229 Chronic tension-type headache, not intractable: Principal | ICD-10-CM
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
## 2024-02-06 NOTE — Unmapped (Signed)
 You were seen in the outpatient neurology clinic today for tension type headaches.    Plan  - try to start taking the muscle relaxer only when you have the headaches. You do not need to take it every night. See if this schedule works with your headaches  - you can also talk to your pain specialist about increasing the dosage of the muscle relaxer if you don't get good relief  - talk to your PCP about your blood pressure control, as high blood pressure can cause tension type headaches

## 2024-02-08 ENCOUNTER — Other Ambulatory Visit: Payer: Self-pay | Admitting: Family Medicine

## 2024-02-08 ENCOUNTER — Ambulatory Visit: Payer: Self-pay

## 2024-02-08 DIAGNOSIS — E88819 Insulin resistance, unspecified: Secondary | ICD-10-CM

## 2024-02-08 NOTE — Telephone Encounter (Signed)
 Pt notified yes prescription say take BID as needed.

## 2024-02-08 NOTE — Telephone Encounter (Signed)
 FYI Only or Action Required?: FYI only for provider  Patient was last seen in primary care on 01/09/2024 by Arleen Lacer, MD. Called Nurse Triage reporting Medication Problem. Symptoms began today. Interventions attempted: Nothing. Symptoms are: stable.  Triage Disposition: Information or Advice Only Call  Patient/caregiver understands and will follow disposition?: Yes  Was asking for clarification on her hydralazine     Copied from CRM [id number]. Topic: Clinical - Red Word Triage >> Feb 08, 2024 10:05 AM Elle L wrote: Red Word that prompted transfer to Nurse Triage: The patient states her blood pressure has been running high. It was 161/85 at her appointment and is now worsening to 167/85. She is requesting to see if it is okay for her to take her hydrALAZINE  (APRESOLINE ) 10 MG tablet. She is unsure if it is prescribed as twice a day or as needed. The patient is having headache along with her high blood pressure. Reason for Disposition  Caller has medicine question only, adult not sick, AND triager answers question  Answer Assessment - Initial Assessment Questions 1. BLOOD PRESSURE: "What is the blood pressure?" "Did you take at least two measurements 5 minutes apart?"     157/85 this morning 2. ONSET: "When did you take your blood pressure?"     This  3. HOW: "How did you take your blood pressure?" (e.g., automatic home BP monitor, visiting nurse)     automatic 4. HISTORY: "Do you have a history of high blood pressure?"     yes 5. MEDICINES: "Are you taking any medicines for blood pressure?" "Have you missed any doses recently?"     no  Answer Assessment - Initial Assessment Questions 1. NAME of MEDICINE: "What medicine(s) are you calling about?"     hydralazine  2. QUESTION: "What is your question?" (e.g., double dose of medicine, side effect)     Instructions on taking medication 3. PRESCRIBER: "Who prescribed the medicine?" Reason: if prescribed by specialist, call should be  referred to that group.     Dr. Ava Lei  Pt was a little confused if she was supposed to be taking the medication twice a day or prn.  Protocols used: Blood Pressure - High-A-AH, Medication Question Call-A-AH
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
## 2024-02-14 ENCOUNTER — Encounter
Admit: 2024-02-14 | Discharge: 2024-02-15 | Payer: Medicare (Managed Care) | Attending: Student in an Organized Health Care Education/Training Program | Primary: Student in an Organized Health Care Education/Training Program

## 2024-02-14 DIAGNOSIS — J8489 Other specified interstitial pulmonary diseases: Principal | ICD-10-CM

## 2024-02-14 DIAGNOSIS — M359 Systemic involvement of connective tissue, unspecified: Principal | ICD-10-CM

## 2024-02-14 MED ORDER — QVAR REDIHALER 80 MCG/ACTUATION HFA BREATH ACTIVATED AEROSOL
Freq: Two times a day (BID) | RESPIRATORY_TRACT | 2 refills | 0.00000 days | Status: CP
Start: 2024-02-14 — End: ?

## 2024-02-14 MED ORDER — ARNUITY ELLIPTA 100 MCG/ACTUATION POWDER FOR INHALATION
0 refills | 0.00000 days
Start: 2024-02-14 — End: ?

## 2024-02-14 MED ORDER — FLUTICASONE PROPIONATE 110 MCG/ACTUATION HFA AEROSOL INHALER
Freq: Two times a day (BID) | RESPIRATORY_TRACT | 2 refills | 0.00000 days | Status: CP
Start: 2024-02-14 — End: 2024-02-14

## 2024-02-14 NOTE — Unmapped (Signed)
 Pulmonary Clinic - Follow-up Virtual Visit      HISTORY:     Active Pulmonary Problems & Brief History:  Caitlin Smith is a 68 y.o. woman with many medical issues here for follow up of follow up for organizing pneumonia.     Interval History:  - 2016 and prior - patient followed at [facility]. They thought her symptoms of dyspnea were from obesity and asthma and recommended ENT evaluation. They also stated concern for chronic aspiration.   - 04/2016 - patient in [facility] system. She was thought to have multifactorial dyspnea, waxing and waning pulmonary opacities. Evaluated by pulmonary hypertension team without evidence of pulmonary hypertension. Underwent bronchoscopy with BAL and transbronchial biopsies which showed significant inflammation. DDX remained broad - CEP, COP, SLE pneumonitis, ABPA, EGPA, AEP, but it was decided to treat her with prolonged steroid taper.   - 08/23/16 - last visit with Dr. Leonard Raker - was improving with steroid taper   - 10/20/16 - established with Dr. Voncile Gu - there had been question of compliance with her prednisone  since last visit, but by time of visit she had been on it consistently for one month. No clear improvement in dyspnea, but remains inactive.   - 02/09/17 - follow up - spirometry showed improvement in FVC and DLCOafter 2 months of steroid, had tapered off by time of visit. When she stopped the prednisone  she developed increased cough, chest tightness, dyspnea.   - 03/27/17 - follow up - given steroid dependence was placed on MMF.   - 10/16/17 - follow up - dose reduced MMF for GI side effects. Prolonged steroid taper over this time.   - 02/06/2019 - last visit with pulmonary, Dr. Marygrace Snellen - at that time had gone to local ED for chest pain, CT chest showed interstitial lung disease in the lung bases, favored to reflect mild progression of chronic fibrotic nonspecific interstitial pneumonia (NSIP). It was unclear to Dr. Marygrace Snellen if that represented actual progression of her underlying disease.   - 03/03/21 - initial visit with me - DOE w/o chest pain. No inhalers. Chronic dry cough thought to be from reflux.  - 06/08/21 - ENT - larynx healthy on laryngoscopy. Referred to GI for GI discomfort and constipation.   - 07/04/21 - follow up - exercise increased to 50 minutes daily, acute worsening 2 weeks prior, evaluation with CT chest, echocardiogram, unremarkable. Chest fluoroscopy with decreased, but normal, movement of left hemidiaphragm.   - 09/13/20 - follow up - had URI over Christmas and New Year treat with doxycycline and followed with lingering hoarseness, productive cough. Increased MMF.   - 12/29/21 - follow up - very easily dyspneic, even just walking to the car or climbing 12 stairs into house. Less active overall. Not as much coughing, allergies are improved. No heartburn with acid reflux medication.   - 02/16/22 - phone visit - PFTs improved from April, continued pred 10mg  daily, MMF 720 BID, HCQ 200mg  BID.   - 04/20/22 - follow up - some palpitations, some dyspnea, some dry cough associated with nausea and dizziness. No clubbing or LE edema. MMF 720mg  BID, HCQ 200mg  BID, prednisone  7.5mg  daily.   - 06/09/22 - ENT - PPI ineffective for cough.Trial of right SLN injection for neurogenic cough.  - 09/12/21 - follow up - doing pulmonary rehab, exercising 5 days a week, walking an hour a day, significant cough worst at night. No changes to treatment.   - 10/19/22 - follow up - nerve injection significantly  helped cough. Exercising without difficulty. DOE climbing stairs.   - 01/25/23 - follow up - significant anxiety about unintentional 40 pound weight loss. Some dyspnea with faster walking, climbing stairs. Still ongoing cough that returned a month after the injection. Is able to walk an hour every morning, 5 days per week. Taking 5mg  daily of prednisone  (at night), mycophenolate  720mg  BID, HCQ 200mg  BID. Recommended to taper prednisone  very slowly over the next few months, continue MMF/HCQ  - 06/26/23 - follow-up. Was doing well walking 5x/wk, then had to cut back to 3x/wk. She gets dyspneic with walking too long. She finds it hard to talk for long periods of time. She is on prednisone  1mg  currently. She is not sure that tapering the prednisone  made her more dyspneic. Having to pace herself. She has been on prednisone  for many years - on varying doses. Cough stable, no fevers. Hoarseness has come back some  - did an injection with ENT. Dry cough. Normally does not use albuterol  but now using it more often though it does not seem to work. She has an old flovent  rx that she has been using which used to help, but no longer. She does check her O2 number at home- gets down to 90 with walking. She has done pulmonary rehab in the past at [facility]. She is overwhelmed by the # of appts. Would like virtual visit next time. Tearful today.   - 09/10/23 - video visit follow up - She is feeling under the weather today and having some back pain. Lungs not bothering her much right now. Reassured with breathing tests looking better. Her back pain is limiting her exercise right now so she is not walking very much. She stopped the prednisone  and is on myfortic  and breathing overall seems better. She is not sure why but wonders if it is related to exercising and eating healthier. She is also doing some breathing exercises. She does still have some chest pains that bother her - happens for a few seconds, 1-2x/day or sometimes not at all, like a nagging toothache, non-exertional, never lasts for more than a few seconds, not related to meals. The fluttering sensation in her chest has resolved with atenolol . Taking her GERD medicine. No pain with inspiration, no cough. Seeing ENT ([facility]) because she is having a dry cough to see if she needs injections again - dry cough with hoarseness, not a lot of phlegm. Currently under the weather (worsened cough) but says this has been this way for some time and her last injection with ENT resolved her symptoms.  - 02/14/24: video visit follow-up. Saw ENT Feb, diagnosed with LPR, started ST. She reports she still gets SOB sometimes, but walking. Not as bad as she has been in the past. Still walking lots. She coughs a lot- was referred to an SLP for cough therapy, has not 'conquered' it yet and coughs all day. She thinks cough is not much better with speech therapy and worries it is coming from her lungs. She is not more SOB than usual.     Past Medical History: The medical and surgical history were personally reviewed and updated in the patient's electronic medical record. Pertinent positives are documented above.  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 Pertinent positives are documented above.  Home Medications: Medications were reviewed and updated in the patient's electronic medical record. Pertinent positives are documented above.   Allergies: Allergies were reviewed and updated in the patient's electronic medical record. Pertinent positives are documented above.  Review of Systems: A comprehensive review of systems was completed and negative except as noted in HPI.    PHYSICAL EXAM:     VIDEO VISIT    General: alert, appears stated age, NAD  Pulm: comfortable respirations on room air    LABORATORY and RADIOLOGY DATA:     Pulmonary Function Tests/Interpretation:  02/14/23      08/03/22      - HR inappropriately low with exercise  (resting 59, peak 84)    Pertinent Laboratory Data:  02/08/22 - Hg 13.4, absolute lymphocyte 3.8    04/25/16 BAL and TBBX   Cultures negative except for OPF     Cell count  PMN 31%  Lymph 11%  Macs 43%  Eos 7%  Baso 6%     Pathology   A: Lung, right lower lobe, endobronchial biopsy   - DIP-like reaction pattern with admixed eosinophils, consistent with eosinophilic pneumonia (see Micro Exam)  - Patchy organizing pneumonia  - Interstitial lymphoplasmacytic infiltrate, polytypic  - Partially denuded benign respiratory mucosa, without viral cytopathic effect  - No malignant neoplasm identified   - GMS and  AFB stains negative for fungi or AFB    Cytology  Lung, Bronchial lavage:  - No malignant cells identified.   - Alveolar macrophages and mixed inflammation.     Pertinent Imaging Data:  07/05/23: Stable appearance of fibrotic changes related to interstitial lung disease associated with connective tissue disease.     09/15/2022 Barium Swallow Double Contrast   Esophageal dysmotility.   Gastroesophageal reflux.   Elevation of the left hemidiaphragm.       06/29/22 CXR  Hypoinflated lungs with no acute abnormalities.     08/02/21 HRCT Chest  Slight worsening of fibrotic changes related to  interstitial lung disease associated with connective tissue disease.       Resolution of right lower lobe consolidation with central clearance consistent with organizing pneumonia.     08/26/21 Sniff Test  Elevated left hemidiaphragm with slightly diminished excursion relative to the right but no diaphragmatic paralysis.    Pertinent Cardiac Data:  05/26/22 Zio   Patient had a min HR of 52 bpm, max HR of 123 bpm, and avg HR of 67 bpm. Predominant underlying rhythm was Sinus Rhythm. Isolated SVEs were rare (<1.0%), and no SVE Couplets or SVE Triplets were present. Isolated VEs were rare (<1.0%), and no VE Couplets or VE Triplets were present.   Symptoms were associated with sinus rhythm and also isolated VEs.     08/27/21 TTE     1. The left ventricular systolic function is normal, LVEF is visually estimated at 60-65%.    2. The right ventricle is normal in size, with normal systolic function.    3. The pulmonary systolic pressure cannot be estimated due to insufficient TR signal.      ASSESSMENT and PLAN     Problem List  Connective Tissue Disease related Interstitial Lung Disease (CTD-ILD)  Cryptogenic Organizing Pneumonia (COP)  SLE   BMI 29  Left hemidiaphragm elevation w/ possible shrinking lung disease   Esophageal dysmotility with GERD    Assessment  Mrs. Aminta Sakurai is an older woman with hx of organizing vs eosinophilic PNA and now CT-ILD/post-inflammatory fibrosis in the setting of known SLE (follows with Dr. Yevonne Heman) who has been managed over the last several years on prednisone , mycophenolate , and hydroxychloroquine . She was weaned off of prednisone  06/2023 in the setting of inverse psoriasis and to prevent other complications. Recent monitoring labs with rheumatology were within normal limits.     In 2024, she was quite dyspneic with exertion and fatigued; we did a relatively broad workup that showed overall stable mild fibrotic changes in her lungs via HRCT, improving PFTs (really overall looking progressively better since 12/2021), echo G1DD. Her did not demonstrate any need for supplemental O2 but she did have an inappropriately low increase in her HR (59->80s with exertion). Her DOE improved over the course of 2024 (maybe with some dietary/exercise changes) but she subsequently developed a worsening cough that has not responded to voice therapy with SLP (saw ENT, felt to be LPR), conservative reflux measures and PPI + pepcid . Though her DOE is stable to improved, we will plan for additional workup as below and trial an ICS for cough.       Plan  - Continue mycophenolate  720mg  BID, HCQ 200mg  BID. Off prednisone .   - In person visit in 2-3 months for exam, PFTs (FVL, DLCO)  - start Qvar  inhaler based on prior symptomatic benefit with ICS (?asthma component vs from OP?)   - consider decreasing atenolol  if worsening DOE/exercise intolerance - would talk with cardiologist first   - referral to virtual pulmonary rehab  **Ask about OSA sxs at followup, consider PSG    The patient was seen with Dr. Maryanne Smiles and will return for an in person visit in 2-3 months.     Valda Garnet, MD  Pulmonary and Critical Care Fellow

## 2024-02-15 ENCOUNTER — Ambulatory Visit
Admit: 2024-02-15 | Discharge: 2024-03-05 | Payer: Medicare (Managed Care) | Attending: Rehabilitative and Restorative Service Providers" | Primary: Rehabilitative and Restorative Service Providers"

## 2024-02-15 ENCOUNTER — Ambulatory Visit
Admit: 2024-02-15 | Payer: Medicare (Managed Care) | Attending: Rehabilitative and Restorative Service Providers" | Primary: Rehabilitative and Restorative Service Providers"

## 2024-02-15 DIAGNOSIS — G8929 Other chronic pain: Secondary | ICD-10-CM | POA: Insufficient documentation

## 2024-02-15 NOTE — Unmapped (Signed)
 [facility] [place]  OUTPATIENT PHYSICAL THERAPY  02/15/2024  Note Type: Evaluation       Patient Name: Caitlin Smith  Date of Birth:10-28-1955  Diagnosis:   Encounter Diagnosis   Name Primary?    Chronic pain of both shoulders Yes     Referring Provider: Referring, Unknown Per *    Date of Onset of Impairment: 08/17/2023  Date PT Care Plan Established or Reviewed: 02/15/2024  Date PT Treatment Started: 02/15/2024     Plan of Care Effective Date: 02/15/2024 - 05/14/2024  Session Number:  1     ASSESSMENT & PLAN   Assessment  Assessment details:      Caitlin Smith is a pleasant 68 y.o. female who presents for Physical Therapy Evaluation with chronic B shoulder pain with history of fibromyalgia. Primary impairments include difficulty with lifting, reaching, pulling, overhead motions, pushing, household chores and donning and doffing clothing. Clinical presentation today is consistent with B shoulder pain with mobility deficits and weakness.  The patient will benefit from skilled Physical Therapy intervention to address the moderate impairments listed below and to assist the patient in maximizing her functional independence and safe return to prior level of function.         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    Clinical Presentation: evolving    Therapy Goals      Goals:        1. In 12 weeks the patient will demonstrate independent performance of HEP to maintain functional gains.   2. In 12 weeks the patient will lift 5# to with B shoulder 10 times to be able to lift gallon of milk up to the counter at home.  3. In 12 weeks, patient will improve shoulder range of motion to full without increase in symptoms for safe return to workout regimen.   4. In 12 weeks the patient will score a 18.1 point change on the SPADI to demonstrate the MDC for MSK conditions (0-100; lower score indicates a lesser level of disability) and to ability to complete laundry without need to frequent rest breaks.         Plan    Therapy options: will be seen for skilled physical therapy services    Planned therapy interventions: Balance Training, Education - Patient, Endurance Activites, Functional Mobility, Home Exercise Program, Education - Family/Caregiver, E-Stim, [employer], Civil engineer, contracting, Diaphragmatic/Pursed-lip Breathing, 97113-Aquatic Therapy, 97112-Neuromuscular Re-education, 97110-Therapeutic Exercises, 97116-Gait Training, 97140-Manual Therapy, 97530-Therapeutic Activities, 97535-Self-Care/Home Training, 97750-Physical Performance Test and 42595, 20561-Dry Needling 1-2, 3+ areas    DME Equipment: [employer].    Frequency: 1x week    Duration in  weeks: 12    Education provided to: patient.    Education provided: HEP, Treatment options and plan, Symptom management, Safety education, Importance of Therapy, Anatomy, Body mechanics, Role of therapy in Rehabilitation, Posture, Community resources and Body awareness    Education results: verbalized good understanding, demonstrates understanding and needs reinforcement. Communication/Consultation: Medicare Cert/POC sent to Referring Provider.              SUBJECTIVE         History of Present Condition     Date of onset:  12/16/2023    History of Present Condition/Chief Complaint:    Associated Diagnoses    Rotator cuff arthropathy of both shoulders [M12.811, M12.812]      Reason for Exam    Comments: Bilateral shoulder pain. ROM intact. Diffuse pain including trapezius. Working on stretches and exercises to reduce pain.      Subjective:  Patient returns to PT with primary complaint now being both shoulders.She reports pain in both shoulders that is a constant aching. She would be interested in therapeutic injection to shoulders.    Started about 2 months ago. Thought it was my fibromyalgia. Feels at the front of the shoulder and goes to the back. Pain can be sore down to elbows and feel some in the back.Pain does not go up it neck.  Getting worse over the last 2 months.  Pain:     Current pain rating:  5    At best pain rating:  3    At worst pain rating:  8  Location:  Both shoulders    Quality:  Aching    Relieving factors:  Rest    Aggravating factors:  Lifting, performance of arm dominant activites, overhead activity, pushing, reaching and pulling    Pain related Behaviors:  Avoidance    Progression:  No change    Red Flags:  None  Precautions/Equipment   Precautions:  None    Current Braces/Orthoses:  None    Equipment Currently Used:  None  Prior Functional Status     No physical limitations    Current Functional Status:    limited exercise, limited lifting, limited recreation, limited household activities and limited work capacity  Social Support:   Barriers to Learning:  No Barriers  Diagnostic Tests:     None    Treatments:     None      Current treatment: physical therapy    Patient Goals:     Patient/Family goals for therapy:  Decreased pain, increased ROM, increased strength, independence with ADLs/IADLs, improve overhead reaching, return to sport/leisure activities and return to recreational activites      Hunger vital sign:  1. Within the past 12 months, we worried whether our food would run out before we got money to buy more. Never True  2. Within the past 12 months, the food we bought just didn't last, and we didn't have money to get more. Never True    If patient identifies with either of the above, patient was provided with local food resource guide and non perishables when possible.    OBJECTIVE     Functional Test/Outcome Measures:  Total SPADI Score: 86 / 130 = 66.2 %  02/15/24    Posture/Observations:   Sitting: rounded shoulders, foward head and increased thoracic kyphosis  Standing: foward flexed posture, rounded shoulders, foward head and increased thoracic kyphosis    Cervical ROM Screen:  [facility]    Range of Motion:   Motion Right  Left   Shoulder Flexion 85 - weak and pain 95 - weak and pain   Shoulder Abduction 77 - weak and pain 77 - weak and pain   Shoulder IR at 90     Functional IR T8 L1   Shoulder ER at 90     Functional ER C7 C7       Strength/MMT:   UE MMT Right Left   Shoulder scaption (full can): 3+/5 3+/5   Shoulder IR (belly press): 3+/5 3+/5   Shoulder ER: 3+/5 3+/5            Special Tests/Clearing Screens:     Special Test Result   Speeds Positive   Hawkins kennedy Positive   Empty can Positive   Drop arm test Negative   ER lag sign Negative         - TTP: Lateral and anterior shoulder bilaterally          No questionnaires on file.  TREATMENT RENDERED     Interpreter Use: Not applicable    Therapeutic Exercise:  25 Minutes   Performed with direct PT demonstration, instruction, supervision, and guidance.   - Education on condition, prognosis, and PT POC  - HEP review as below   - NuStep seat 10 Arms 12 L4 8 min   - Shoulder flexion slides on table  - Shoulder aarom dowel flexion  - Shoulder isometric flexion         Next Visit Plan: Progress shoulder mobility and strengthening as tolerated      Total Treatment Time: 45 Minutes  PT Evaluation Charges  $$ PT Evaluation - MOD Complexity [mins]: 20     Therapeutic Interventions Charges  $$ Therapeutic Exercise [mins]: 25                 I attest that I have reviewed the above information.  Signed: Jami Mcclintock, PT, DPT  02/15/2024 8:19 AM        I reviewed the no-show/attendance policy with the patient and caregiver(s). The patient is aware that they must call to cancel appointments more than 24 hours in advance. They are also aware that if they late cancel or no-show three times, we reserve the right to cancel their remaining appointments. This policy is in place to allow us  to best serve the needs of our caseload.    If patient returns to clinic with variance in plan of care, then it may be attributable to one or more of the following factors: preferred clinician availability, appointment time request availability, therapy pool appointment availability, major holiday with clinic closure, caregiver availability, patient transportation, conflicting medical appointment, inclement weather, and/or patient illness.    If patient does not return for follow up visit(s) related to this episode of care, this note will serve as their discharge note from Physical Therapy.

## 2024-02-15 NOTE — Unmapped (Signed)
 [facility] Refill Coordination Note    Specialty Medication(s) to be Shipped:   Inflammatory Disorders: mycophenolate     Other medication(s) to be shipped: No additional medications requested for fill at this time     Caitlin Smith, DOB: 23-Jul-1956  Phone: [phone] (home) [phone] (work)      All above HIPAA information was verified with patient.     Was a Nurse, learning disability used for this call? No    Completed refill call assessment today to schedule patient's medication shipment from the [facility]  [phone]).  All relevant notes have been reviewed.     Specialty medication(s) and dose(s) confirmed: Regimen is correct and unchanged.   Changes to medications: [name] reports no changes at this time.  Changes to insurance: No  New side effects reported not previously addressed with a pharmacist or physician: None reported  Questions for the pharmacist: No    Confirmed patient received a Conservation officer, historic buildings and a Surveyor, mining with first shipment. The patient will receive a drug information handout for each medication shipped and additional FDA Medication Guides as required.       DISEASE/MEDICATION-SPECIFIC INFORMATION        N/A    SPECIALTY MEDICATION ADHERENCE     Medication Adherence    Specialty Medication: mycophenolate  360 MG Tbec (MYFORTIC )  Patient is on additional specialty medications: No              Were doses missed due to medication being on hold? No        mycophenolate  360 MG Tbec (MYFORTIC ): 10 days of medicine on hand     REFERRAL TO PHARMACIST     Referral to the pharmacist: Not needed      [facility]     Shipping address confirmed in Epic.     Cost and Payment: Patient has a $0 copay, payment information is not required.    Delivery Scheduled: Yes, Expected medication delivery date: 6.19.25.     Medication will be delivered via Next Day Courier to the prescription address in Epic WAM.    Marcella Serge   [facility]  Specialty Technician

## 2024-02-17 NOTE — Unmapped (Signed)
 I saw and evaluated the patient, participating in the key portions of the service.  I reviewed the resident???s note.  I agree with the resident???s findings and plan. Epimenio Sarin, MD

## 2024-02-21 MED FILL — MYCOPHENOLATE SODIUM 360 MG TABLET,DELAYED RELEASE: ORAL | 30 days supply | Qty: 120 | Fill #6

## 2024-02-23 ENCOUNTER — Ambulatory Visit

## 2024-02-29 ENCOUNTER — Encounter: Admit: 2024-02-29 | Discharge: 2024-02-29 | Payer: Medicare (Managed Care) | Attending: Nephrology | Primary: Nephrology

## 2024-02-29 DIAGNOSIS — M329 Systemic lupus erythematosus, unspecified: Principal | ICD-10-CM

## 2024-02-29 DIAGNOSIS — I10 Essential (primary) hypertension: Principal | ICD-10-CM

## 2024-02-29 LAB — RENAL FUNCTION PANEL
ALBUMIN: 3.6 g/dL (ref 3.4–5.0)
ANION GAP: 7 mmol/L (ref 5–14)
BLOOD UREA NITROGEN: 17 mg/dL (ref 9–23)
BUN / CREAT RATIO: 19
CALCIUM: 9.5 mg/dL (ref 8.7–10.4)
CHLORIDE: 104 mmol/L (ref 98–107)
CO2: 32 mmol/L — ABNORMAL HIGH (ref 20.0–31.0)
CREATININE: 0.89 mg/dL (ref 0.55–1.02)
EGFR CKD-EPI (2021) FEMALE: 72 mL/min/{1.73_m2} (ref >=60)
GLUCOSE RANDOM: 56 mg/dL — ABNORMAL LOW (ref 70–179)
PHOSPHORUS: 3.8 mg/dL (ref 2.4–5.1)
POTASSIUM: 4.7 mmol/L (ref 3.4–4.8)
SODIUM: 143 mmol/L (ref 135–145)

## 2024-02-29 LAB — PROTEIN / CREATININE RATIO, URINE
CREATININE, URINE: 119.5 mg/dL
PROTEIN URINE: 29.5 mg/dL
PROTEIN/CREAT RATIO, URINE: 0.247

## 2024-02-29 NOTE — Unmapped (Signed)
 Patient labs drawn in room prior to leaving Nephrology clinic, labs drawn by roving phlebotomy or patient sent to the lab due to rover wait time.

## 2024-03-04 NOTE — Unmapped (Signed)
 [facility]  OUTPATIENT PHYSICAL THERAPY  03/04/2024  Note Type: Treatment Note       Patient Name: Caitlin Smith  Date of Birth:April 09, 1956  Diagnosis:   Encounter Diagnosis   Name Primary?    Chronic pain of both shoulders Yes     Referring Provider: Referring, Unknown Per *    Date of Onset of Impairment: 08/17/2023  Date PT Care Plan Established or Reviewed: 02/15/2024  Date PT Treatment Started: 02/15/2024     Plan of Care Effective Date: 02/15/2024 - 05/14/2024  Session Number:  2     ASSESSMENT & PLAN   Assessment  Assessment details:    Today: Caitlin Smith is a pleasant 68 y.o. female who presents for Physical Therapy Evaluation with chronic B shoulder pain with history of fibromyalgia. Patient challenged with introduction to resistive periscapular strengthening requiring moderate verbal cueing for maintain form and rest breaks due to fatigue. Patient reports mobility and gentle strengthening assisted with decreasing some discomfort.  The patient will benefit from skilled Physical Therapy intervention to address the moderate impairments listed below and to assist the patient in maximizing her functional independence and safe return to prior level of function.       Evaluation:  Caitlin Smith is a pleasant 68 y.o. female who presents for Physical Therapy Evaluation with chronic B shoulder pain with history of fibromyalgia. Primary impairments include difficulty with lifting, reaching, pulling, overhead motions, pushing, household chores and donning and doffing clothing. Clinical presentation today is consistent with B shoulder pain with mobility deficits and weakness.  The patient will benefit from skilled Physical Therapy intervention to address the moderate impairments listed below and to assist the patient in maximizing her functional independence and safe return to prior level of function.         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    Clinical Presentation: evolving    Therapy Goals      Goals:        1. In 12 weeks the patient will demonstrate independent performance of HEP to maintain functional gains.   2. In 12 weeks the patient will lift 5# to with B shoulder 10 times to be able to lift gallon of milk up to the counter at home.  3. In 12 weeks, patient will improve shoulder range of motion to full without increase in symptoms for safe return to workout regimen.   4. In 12 weeks the patient will score a 18.1 point change on the SPADI to demonstrate the MDC for MSK conditions (0-100; lower score indicates a lesser level of disability) and to ability to complete laundry  without need to frequent rest breaks.         Plan    Therapy options: will be seen for skilled physical therapy services    Planned therapy interventions: Balance Training, Education - Patient, Endurance Activites, Functional Mobility, Home Exercise Program, Education - Family/Caregiver, E-Stim, [employer], Civil engineer, contracting, Diaphragmatic/Pursed-lip Breathing, 97113-Aquatic Therapy, 97112-Neuromuscular Re-education, 97110-Therapeutic Exercises, 97116-Gait Training, 97140-Manual Therapy, 97530-Therapeutic Activities, 97535-Self-Care/Home Training, 97750-Physical Performance Test and 79439, 20561-Dry Needling 1-2, 3+ areas    DME Equipment: [employer].    Frequency: 1x week    Duration in weeks: 12    Education provided to: patient.    Education provided: HEP, Treatment options and plan, Symptom management, Safety education, Importance of Therapy, Anatomy, Body mechanics, Role of therapy in Rehabilitation, Posture, Community resources and Body awareness    Education results: verbalized good understanding, demonstrates understanding and needs reinforcement.    Communication/Consultation: Medicare Cert/POC sent to Referring Provider.      Total Session Time: 40    Treatment rendered today:      40        SUBJECTIVE         History of Present Condition     Date of onset:  12/16/2023    History of Present Condition/Chief Complaint:    Associated Diagnoses    Rotator cuff arthropathy of both shoulders [M12.811, M12.812]      Reason for Exam    Comments: Bilateral shoulder pain. ROM intact. Diffuse pain including trapezius. Working on stretches and exercises to reduce pain.      Subjective:  Today: Shoulders been hurting the last week.     Evaluation: Patient returns to PT with primary complaint now being both shoulders.She reports pain in both shoulders that is a constant aching. She would be interested in therapeutic injection to shoulders.    Started about 2 months ago. Thought it was my fibromyalgia. Feels at the front of the shoulder and goes to the back. Pain can be sore down to elbows and feel some in the back.Pain does not go up it neck.  Getting worse over the last 2 months.  Pain:     Current pain rating:  5    At best pain rating:  3    At worst pain rating:  8  Location:  Both shoulders    Quality:  Aching    Relieving factors:  Rest    Aggravating factors:  Lifting, performance of arm dominant activites, overhead activity, pushing, reaching and pulling    Pain related Behaviors:  Avoidance    Progression:  No change    Red Flags:  None  Precautions/Equipment   Precautions:  None    Current Braces/Orthoses:  None    Equipment Currently Used:  None  Prior Functional Status     No physical limitations    Current Functional Status:    limited exercise, limited lifting, limited recreation, limited household activities and limited work capacity  Social Support:   Barriers to Learning:  No Barriers  Diagnostic Tests:     None    Treatments:     None      Current treatment: physical therapy    Patient Goals:     Patient/Family goals for therapy:  Decreased pain, increased ROM, increased strength, independence with ADLs/IADLs, improve overhead reaching, return to sport/leisure activities and return to recreational activites      Hunger vital sign:  1. Within the past 12 months, we worried whether our food would run  out before we got money to buy more. Never True  2. Within the past 12 months, the food we bought just didn't last, and we didn't have money to get more. Never True    If patient identifies with either of the above, patient was provided with local food resource guide and non perishables when possible.    OBJECTIVE     Functional Test/Outcome Measures:  Total SPADI Score: 86 / 130 = 66.2 %  02/15/24    Posture/Observations:   Sitting: rounded shoulders, foward head and increased thoracic kyphosis  Standing: foward flexed posture, rounded shoulders, foward head and increased thoracic kyphosis    Cervical ROM Screen:  [facility]    Range of Motion:   Motion Right Left   Shoulder Flexion 85 - weak and pain 95 - weak and pain   Shoulder Abduction 77 - weak and pain 77 - weak and pain   Shoulder IR at 90     Functional IR T8 L1   Shoulder ER at 90     Functional ER C7 C7       Strength/MMT:   UE MMT Right Left   Shoulder scaption (full can): 3+/5 3+/5   Shoulder IR (belly press): 3+/5 3+/5   Shoulder ER: 3+/5 3+/5            Special Tests/Clearing Screens:     Special Test Result   Speeds Positive   Hawkins kennedy Positive   Empty can Positive   Drop arm test Negative   ER lag sign Negative         - TTP: Lateral and anterior shoulder bilaterally          T Fam Both    02/19/2024 10:45 AM EDT - Fredricka by Patient   Medical History   Anemia No   Diabetes Yes   Date first noted (approx)    Comments?    Heart attack No   Anxiety Yes   Comments?    Date first noted (approx)    Emphysema No   Nerve/muscle disease Yes   Date first noted (approx)    Comments?    Arthritis Yes   Date first noted (approx)    Comments?    Frequent heartburn or acid reflux Yes   Date first noted (approx)    Comments?    Brittle bones (osteoporosis) No   Asthma Yes   Date first noted (approx)    Comments?    High cholesterol No   Glaucoma No   Seizures No   Do you have a history of cancer? No   Heart murmur No   Sickle cell disease No   Cataract No   Hepatitis No   Stroke No   Congestive heart failure (CHF) No   HIV/AIDS No   Substance abuse No   Bleeding problem (for example: hemophilia) No   High blood pressure Yes   Date first noted (approx)    Comments?    Thyroid disease No   Chronic obstructive pulmonary disease (COPD) or emphysema Yes   Date first noted (approx)    Comments?    Do you have a history of kidney disease? Yes   Date first noted (approx)    Comments?    Tuberculosis No   Depression Yes   Date first noted (approx)    Comments?    Brain/spinal cord infection (meningitis) No   Ulcers (GI) No   Gout No   Obesity Yes   Comments?  Date first noted (approx)    Surgical History   Appendix removed (appendectomy) No   C-Section No   Prostate surgery No   Brain surgery Yes   Occurrence date (approx)    Comments?    Eye surgery No   Small intestine surgery No   Breast surgery Yes   Comments?    Occurrence date (approx)    Surgery for broken bone No   Spine surgery Yes   Comments?    Occurrence date (approx)    Heart bypass No   Hernia repair No   Tubes tied No Gall bladder removal Yes   Comments?    Occurrence date (approx)    Uterus removed (hysterectomy & cervix status unknown) Yes   Comments?    Occurrence date (approx) 07/09/2001   Heart valve replacement No   Colon / large intestine surgery No   Joint replacement Yes   Comments?    Occurrence date (approx)    Vasectomy No   Plastic surgery No   Family History Refer to Family History Response table below   Tobacco Use Never   Smokeless Tobacco Never   Alcohol Use    Drug Use Never   Sexually Active Not Currently   Partners Female   Birth Control / Protection None   Comments?      Family History Response  Family Member Status Father Mother Problems Age of Onset Comments   Mother Nickey) Deceased Maternal Grandfather 02/09/24) Maternal Grandmother Cancer -- --       Diabetes -- --       Hypertension -- --       Breast cancer -- --   Cousin Lessie) Deceased -- -- Breast cancer 45 --   Maternal Aunt Alberteen) Alive Maternal Grandfather 2024-02-09) Maternal Grandmother Cancer -- --       Breast cancer 45 --   Father (Morrris) Deceased Paternal Grandfather Administrator, Civil Service) Paternal Grandmother Cancer -- --       Stomach cancer 23 --   Cousin (Tammy) Alive -- -- Breast cancer 43 --   Maternal Aunt Wallie) Alive Maternal Grandfather 02/09/24) Maternal Grandmother Breast cancer 85 --   Maternal Aunt (Pricilla) Deceased Maternal Grandfather 2024-02-09) Maternal Grandmother Cancer 58 --   Maternal Grandfather February 09, 2024) Deceased -- -- Stroke -- --       Glaucoma -- --   Sister Tax inspector) Alive Father (Morrris) Mother Nickey) Cancer -- --       Diabetes -- --   Maternal Grandmother Deceased -- -- No Known Problems -- --   Maternal Uncle Reginold) Deceased Maternal Grandfather 02-09-24) Maternal Grandmother Stomach cancer -- --   Paternal Aunt (Dot) Deceased Paternal Grandfather Reginold) Paternal Grandmother Cancer -- --       Stomach cancer -- --   Paternal Uncle Reginold) Deceased Paternal Grandfather Reginold) Paternal Grandmother Cancer -- --   Maternal Aunt (Dorothy) Deceased Maternal Grandfather Feb 09, 2024) Maternal Grandmother Breast cancer -- --   Neg Hx -- -- -- Alcohol abuse -- --       Drug abuse -- --       Depression -- --       Seizures -- --   Sister Rosezella) Alive Father (Morrris) Mother Nickey) COPD -- --       Diabetes -- --       Arthritis -- --   Daughter Alive -- Patient [place]) No Known Problems -- --   Paternal Grandmother Alive -- -- No Known Problems -- --   Paternal  Grandfather Reginold) Alive -- -- Diabetes -- --       Stroke -- --   Other Alive -- -- No Known Problems -- --   Son Alive -- Patient Gladwin) -- -- --   Sister Alive Father (Morrris) Mother Nickey) -- -- --   Sister Alive Father (Morrris) Mother Nickey) -- -- --   Maternal Aunt (Louise Tye) Alive -- -- Cancer -- --   Maternal Aunt (Pricilla) Alive Maternal Grandfather (May) Maternal Grandmother Cancer -- --   Sister Chemical engineer) Alive Father (Morrris) Mother Nickey) Diabetes -- --       TREATMENT RENDERED     Interpreter Use: Not applicable    Therapeutic Exercise:  40 Minutes   Performed with direct PT demonstration, instruction, supervision, and guidance.   - Education on condition, prognosis, and PT POC  - HEP review as below   - NuStep seat 10 Arms 12 L4 8 min   - Shoulder flexion slides on table  - Shoulder aarom dowel flexion  - Shoulder isometric flexion   - Shoulder rows GTB  - Shoulder extensions GTB  - Bicep curl 2#      Next Visit Plan: Progress shoulder mobility and strengthening as tolerated      Total Treatment Time: 40 Minutes        Therapeutic Interventions Charges  $$ 97110 - Therapeutic Exercise [mins]: 40                 I attest that I have reviewed the above information.  Signed: Fonda Ned, PT, DPT  03/04/2024 8:32 AM        I reviewed the no-show/attendance policy with the patient and caregiver(s). The patient is aware that they must call to cancel appointments more than 24 hours in advance. They are also aware that if they late cancel or no-show three times, we reserve the right to cancel their remaining appointments. This policy is in place to allow us  to best serve the needs of our caseload.    If patient returns to clinic with variance in plan of care, then it may be attributable to one or more of the following factors: preferred clinician availability, appointment time request availability, therapy pool appointment availability, major holiday with clinic closure, caregiver availability, patient transportation, conflicting medical appointment, inclement weather, and/or patient illness.    If patient does not return for follow up visit(s) related to this episode of care, this note will serve as their discharge note from Physical Therapy.

## 2024-03-07 DIAGNOSIS — I10 Essential (primary) hypertension: Principal | ICD-10-CM

## 2024-03-07 DIAGNOSIS — M329 Systemic lupus erythematosus, unspecified: Principal | ICD-10-CM

## 2024-03-07 DIAGNOSIS — N182 Chronic kidney disease, stage 2 (mild): Principal | ICD-10-CM
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
## 2024-03-07 NOTE — Unmapped (Signed)
 I was immediately available via  phone/pager or present on site.  I reviewed the case but did not see the patient.  I agree with the assessment and plan as documented in the resident's note. Shontelle Muska, MD

## 2024-03-07 NOTE — Unmapped (Signed)
 I was immediately available via  phone/pager or present on site.  I reviewed the case but did not see the patient.  I agree with the assessment and plan as documented in the resident's note. Jamie-Lee Galdamez, MD

## 2024-03-11 ENCOUNTER — Encounter
Admit: 2024-03-11 | Discharge: 2024-03-12 | Payer: Medicare (Managed Care) | Attending: Psychologist | Primary: Psychologist

## 2024-03-11 DIAGNOSIS — F410 Panic disorder [episodic paroxysmal anxiety] without agoraphobia: Principal | ICD-10-CM

## 2024-03-11 DIAGNOSIS — F331 Major depressive disorder, recurrent, moderate: Principal | ICD-10-CM

## 2024-03-11 DIAGNOSIS — F4310 Post-traumatic stress disorder, unspecified: Principal | ICD-10-CM

## 2024-03-11 DIAGNOSIS — F411 Generalized anxiety disorder: Principal | ICD-10-CM

## 2024-03-11 NOTE — Unmapped (Signed)
 [facility]   Confidential Psychological Therapy Session      Patient Name: Caitlin Smith  Medical Record Number: [id number]  Date of Service: March 11, 2024  Attending Psychologist: Greig Holland, PhD  CPT Procedure Codes: 09162 for 60 mins of face to face counseling    This visit was attempted via face to face with interactive technology using a HIPPA compliant audio/visual platform; patient unable to connect and so telephone was used. Her computer apparently broke last night. We reviewed confidentiality today. The patient was present in [place] , a state in which this provider is licensed and able to provide care (location and contact information confirmed), attended this visit alone, and consented to this virtual pain psychology visit.    REFERRING PHYSICIAN: Priscilla Agent, MD    CHIEF COMPLAINT AND REASON FOR VISIT: pain coping skills, CBT to address depression and anxiety in the setting of chronic pain    SUBJECTIVE / HISTORY OF PRESENT ILLNESS: [name] is a very pleasant 68 y.o.  female from [place], [place] with multiple chronic pain complaints related to fibromyalgia and lupus who initially met with me in October 2016, at which time she was diagnosed with severe depression, PTSD, panic disorder, and generalized anxiety. The patient returns for a therapy session today. Last follow up with me was in 01/15/24.    Pt processes thoughts and feelings about health stressors, using a mindfulness approach.  Patient notes her bp has been variable and thinks that perhaps it is due to stress. Processes stress related to daughter/granddaughter's softball tournaments - had wanted to come with and her daughter apparently is carpooling and has car problems. Pt shares her daughter is ungrateful when her daughter has borrowed money for various expenses. Reviewed boundary setting today and processed how to set. Spent extra time processing how being a single mother has affected life stance towards family relationships.     OBJECTIVE / MENTAL STATUS:    Appearance:   Appears stated age and Clean/Neat   Motor:  No abnormal movements   Speech/Language:   Normal rate, volume, tone, fluency   Mood:  Depressed and Anxious   Affect:  euthymic   Thought process:  Logical, linear, clear, coherent, goal directed   Thought content:    Denies SI, HI, self harm, delusions, obsessions, paranoid ideation, or ideas of reference   Perceptual disturbances:    Denies auditory and visual hallucinations, behavior not concerning for response to internal stimuli   Orientation:  Oriented to person, place, time, and general circumstances   Attention:  Able to fully attend without fluctuations in consciousness   Concentration:  Able to fully concentrate and attend   Memory:  Immediate, short-term, long-term, and recall grossly intact    Fund of knowledge:   Consistent with level of education and development   Insight:    Fair   Judgment:   Intact   Impulse Control:  Intact     DIAGNOSTIC IMPRESSION:   Post Traumatic Stress Disorder (PTSD)  Generalized Anxiety Disorder (GAD)  Panic Disorder  Major Depressive Disorder, moderate, recurrent  Chronic pain syndrome  Fibromyalgia  Lupus    ASSESSMENT:   [name] is a very pleasant 68 y.o.  female from [place], [place] with multiple chronic pain complaints related to lupus, arthritis, and fibromyalgia. She was previously seen in our clinic from 2012-2014, and reestablished care with Dr. Agent in August 2016. The patient has struggled with depression and anxiety, but is very  motivated to participate in intensive, multidisciplinary treatment to address the connection between depression, anxiety and pain. She has a long-standing history of depression and anxiety, and has worked with outpatient psychiatrist and therapist over the years. She is currently established with [facility] psychiatry.  In general, depression, anxiety, pain coping and panic have all improved (until recent bout with pulmonary issues), but the patient evidences a seasonal affective pattern of mood changes typically her mood tends to be worse during the winter.  Encouraged early morning sunlight and getting outside and walking as tolerated, building cardiovascular and muscular strength and endurance over time. Also encouraged practice of daily behavioral relaxation exercises as well. Has lost weight, unclear why.    PLAN:   (1) Psychotherapy - Continue CBT. CBT will be used to address PTSD, MDD, Panic D/o, GAD, and chronic pain.     --Behavioral Activation - Encouraged behavioral activation.  Is trying. Walking in AM.  --Downloaded and uses Insight Timer, guided relaxation app, for nightly practice.  --Continues to utilize diaphragmatic breathing daily  --encouraged early morning sunlight / light therapy for possible SAD in the setting of chronic MDD    (2) Psychiatry - Pt currently established with [facility].  Is followed by Dr. Fatima (attending) but typically has appointments with a psychiatrist resident, Caitlin Smith.    (3) Safety - Pt denies any current or recent SI or safety concerns. Knows to call 911 or go to her local ED. Also previously given [facility].

## 2024-03-15 NOTE — Unmapped (Signed)
 Called back patient per request: regarding next appointment with Dr. Shelda. Reviewed resident transition with Ms. Januszewski again. She expressed understanding and wanted confirmation.     Follow up appointment in [facility] on 8/21. Patient aware.

## 2024-03-15 NOTE — Unmapped (Signed)
 Patient left voice message on nurse line stating that she is having really bad pain in right shoulder, its sometimes hard to lift shoulder. She is currently in PT but it does not seem to be helping. Patient is requesting Xray to see what is going on.  Previous office visit: 08/17/23. Previous procedure: 10/02/23. Next office visit: None scheduled.  I spoke with patient, informed her that she would need to schedule an appointment to be reevaluated by the provider. Patient is agreement. Transferred patient to appointment staff to schedule appointment.

## 2024-03-20 ENCOUNTER — Inpatient Hospital Stay: Admit: 2024-03-20 | Discharge: 2024-03-20 | Payer: Medicare (Managed Care)

## 2024-03-20 ENCOUNTER — Ambulatory Visit
Admit: 2024-03-20 | Discharge: 2024-03-20 | Payer: Medicare (Managed Care) | Attending: Student in an Organized Health Care Education/Training Program | Primary: Student in an Organized Health Care Education/Training Program

## 2024-03-20 NOTE — Unmapped (Signed)
 Unfortunately patient had simultaneous appointments scheduled and left before being seen. Tried switching to virtual visit but she had not logged on - called x2, voicemail box full. Will send mychart message. PFTs reviewed and overall stable to improving.     Guido Robby Bullock, MD   Fellow, Pulmonary and Critical Care Medicine  Pager [phone]  March 20, 2024

## 2024-03-21 NOTE — Unmapped (Addendum)
 [facility] Refill Coordination Note    Specialty Medication(s) to be Shipped:   Inflammatory Disorders: mycophenolate     Other medication(s) to be shipped: No additional medications requested for fill at this time     Caitlin Smith, DOB: 1955/10/24  Phone: [phone] (home) [phone] (work)      All above HIPAA information was verified with patient.     Was a Nurse, learning disability used for this call? No    Completed refill call assessment today to schedule patient's medication shipment from the [facility]  [phone]).  All relevant notes have been reviewed.     Specialty medication(s) and dose(s) confirmed: Regimen is correct and unchanged.   Changes to medications: Caitlin Smith reports no changes at this time.  Changes to insurance: No  New side effects reported not previously addressed with a pharmacist or physician: None reported  Questions for the pharmacist: No    Confirmed patient received a Conservation officer, historic buildings and a Surveyor, mining with first shipment. The patient will receive a drug information handout for each medication shipped and additional FDA Medication Guides as required.       DISEASE/MEDICATION-SPECIFIC INFORMATION        N/A    SPECIALTY MEDICATION ADHERENCE     Medication Adherence    Specialty Medication: mycophenolate  360 MG Tbec (MYFORTIC )  Patient is on additional specialty medications: No              Were doses missed due to medication being on hold? No      mycophenolate  360 MG Tbec (MYFORTIC ): 7 days of medicine on hand       REFERRAL TO PHARMACIST     Referral to the pharmacist: Yes - routine compliance concerns. Patient has missed 1-3 doses of medication. Refills were scheduled and concern routed to pharmacist for evaluation.      SHIPPING     Shipping address confirmed in Epic.     Cost and Payment: Patient has a $0 copay, payment information is not required.    Delivery Scheduled: Yes, Expected medication delivery date: 7.23.25.     Medication will be delivered via Next Day Courier to the prescription address in Epic WAM.    Caitlin Smith   [facility]  Specialty Technician      This pharmacist was notified by a technician that this patient has reported that they've missed 1 doses of their Myfortic .. I have reviewed the patient's medical record and have determined that no further pharmacist action is needed.      Approximate time spent: 0-5 minutes    Caitlin Smith, PharmD, Clinical Specialty Pharmacist  [facility]

## 2024-03-22 ENCOUNTER — Ambulatory Visit
Admit: 2024-03-22 | Payer: Medicare (Managed Care) | Attending: Rehabilitative and Restorative Service Providers" | Primary: Rehabilitative and Restorative Service Providers"

## 2024-03-26 MED FILL — MYCOPHENOLATE SODIUM 360 MG TABLET,DELAYED RELEASE: ORAL | 30 days supply | Qty: 120 | Fill #7

## 2024-03-27 DIAGNOSIS — R1013 Epigastric pain: Principal | ICD-10-CM

## 2024-03-27 MED ORDER — FAMOTIDINE 20 MG TABLET
ORAL_TABLET | Freq: Two times a day (BID) | ORAL | 2 refills | 0.00000 days
Start: 2024-03-27 — End: ?

## 2024-03-28 MED ORDER — FAMOTIDINE 20 MG TABLET
ORAL_TABLET | Freq: Two times a day (BID) | ORAL | 2 refills | 100.00000 days | Status: CP
Start: 2024-03-28 — End: ?

## 2024-03-29 ENCOUNTER — Ambulatory Visit
Admit: 2024-03-29 | Payer: Medicaid (Managed Care) | Attending: Rehabilitative and Restorative Service Providers" | Primary: Rehabilitative and Restorative Service Providers"

## 2024-03-29 ENCOUNTER — Ambulatory Visit
Admit: 2024-03-29 | Payer: PRIVATE HEALTH INSURANCE | Attending: Rehabilitative and Restorative Service Providers" | Primary: Rehabilitative and Restorative Service Providers"

## 2024-03-29 NOTE — Unmapped (Signed)
 [facility] PT ACC [place]  OUTPATIENT PHYSICAL THERAPY  03/29/2024  Note Type: Treatment Note       Patient Name: Caitlin Smith  Date of Birth:1955-12-24  Diagnosis:   Encounter Diagnosis   Name Primary?    Chronic pain of both shoulders Yes     Referring Provider: Referring, Unknown Per *    Date of Onset of Impairment: 08/17/2023  Date PT Care Plan Established or Reviewed: 02/15/2024  Date PT Treatment Started: 02/15/2024     Plan of Care Effective Date: 02/15/2024 - 05/14/2024  Session Number:  3     ASSESSMENT & PLAN   Assessment  Assessment details:    Today: [name] is a pleasant 68 y.o. female who presents for Physical Therapy Evaluation with chronic B shoulder pain with history of fibromyalgia. Patient unsure what caused flare up from last week but reports this week has been a little better. Session focused around symptom modulation and strengthening without increasing symptoms.  Patient continues to be challenged with shoulder flexion/abduction > 90 deg due to high irritability of symptoms. The patient will benefit from skilled Physical Therapy intervention to address the moderate impairments listed below and to assist the patient in maximizing her functional independence and safe return to prior level of function.       Evaluation:  [name] is a pleasant 68 y.o. female who presents for Physical Therapy Evaluation with chronic B shoulder pain with history of fibromyalgia. Primary impairments include difficulty with lifting, reaching, pulling, overhead motions, pushing, household chores and donning and doffing clothing. Clinical presentation today is consistent with B shoulder pain with mobility deficits and weakness.  The patient will benefit from skilled Physical Therapy intervention to address the moderate impairments listed below and to assist the patient in maximizing her functional independence and safe return to prior level of function.         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    Clinical Presentation: evolving    Therapy Goals      Goals:        1. In 12 weeks the patient will demonstrate independent performance of HEP to maintain functional gains.   2. In 12 weeks the patient will lift 5# to with B shoulder 10 times to be able to lift gallon of milk up to the counter at home.  3. In 12 weeks, patient will improve shoulder range of motion to full without increase in symptoms for safe return to workout regimen.   4. In 12 weeks the patient will score a 18.1 point change on the SPADI to demonstrate the MDC for MSK conditions (0-100; lower  score indicates a lesser level of disability) and to ability to complete laundry without need to frequent rest breaks.         Plan    Therapy options: will be seen for skilled physical therapy services    Planned therapy interventions: Balance Training, Education - Patient, Endurance Activites, Functional Mobility, Home Exercise Program, Education - Family/Caregiver, E-Stim, [employer], Civil engineer, contracting, Diaphragmatic/Pursed-lip Breathing, 97113-Aquatic Therapy, 97112-Neuromuscular Re-education, 97110-Therapeutic Exercises, 97116-Gait Training, 97140-Manual Therapy, 97530-Therapeutic Activities, 97535-Self-Care/Home Training, 97750-Physical Performance Test and 79439, 20561-Dry Needling 1-2, 3+ areas    DME Equipment: [employer].    Frequency: 1x week    Duration in weeks: 12    Education provided to: patient.    Education provided: HEP, Treatment options and plan, Symptom management, Safety education, Importance of Therapy, Anatomy, Body mechanics, Role of therapy in Rehabilitation, Posture, Community resources and Body awareness    Education results: verbalized good understanding, demonstrates understanding and needs reinforcement.    Communication/Consultation: Medicare Cert/POC sent to Referring Provider.      Total Session Time: 40    Treatment rendered today:      40        SUBJECTIVE         History of Present Condition     Date of onset:  12/16/2023    History of Present Condition/Chief Complaint:    Associated Diagnoses    Rotator cuff arthropathy of both shoulders [M12.811, M12.812]      Reason for Exam    Comments: Bilateral shoulder pain. ROM intact. Diffuse pain including trapezius. Working on stretches and exercises to reduce pain.      Subjective:  Today: Last week was really tough. The whole body was hurting and it was bad. So I couldn't do much.     Evaluation: Patient returns to PT with primary complaint now being both shoulders.She reports pain in both shoulders that is a constant aching. She would be interested in therapeutic injection to shoulders.    Started about 2 months ago. Thought it was my fibromyalgia. Feels at the front of the shoulder and goes to the back. Pain can be sore down to elbows and feel some in the back.Pain does not go up it neck.  Getting worse over the last 2 months.  Pain:     Current pain rating:  5    At best pain rating:  3    At worst pain rating:  8  Location:  Both shoulders    Quality:  Aching    Relieving factors: Rest    Aggravating factors:  Lifting, performance of arm dominant activites, overhead activity, pushing, reaching and pulling    Pain related Behaviors:  Avoidance    Progression:  No change    Red Flags:  None  Precautions/Equipment   Precautions:  None    Current Braces/Orthoses:  None    Equipment Currently Used:  None  Prior Functional Status     No physical limitations    Current Functional Status:    limited exercise, limited lifting, limited recreation, limited household activities and limited work capacity  Social Support:   Barriers to Learning:  No Barriers  Diagnostic Tests:     None    Treatments:     None      Current treatment: physical therapy    Patient Goals:     Patient/Family goals for therapy:  Decreased pain, increased ROM, increased strength, independence with ADLs/IADLs, improve overhead reaching, return to sport/leisure activities and return  to recreational activites      Hunger vital sign:  1. Within the past 12 months, we worried whether our food would run out before we got money to buy more. Never True  2. Within the past 12 months, the food we bought just didn't last, and we didn't have money to get more. Never True    If patient identifies with either of the above, patient was provided with local food resource guide and non perishables when possible.    OBJECTIVE     Functional Test/Outcome Measures:  Total SPADI Score: 86 / 130 = 66.2 %  02/15/24    Posture/Observations:   Sitting: rounded shoulders, foward head and increased thoracic kyphosis  Standing: foward flexed posture, rounded shoulders, foward head and increased thoracic kyphosis    Cervical ROM Screen:  [facility]    Range of Motion:   Motion Right Left   Shoulder Flexion 85 - weak and pain 95 - weak and pain   Shoulder Abduction 77 - weak and pain 77 - weak and pain   Shoulder IR at 90     Functional IR T8 L1   Shoulder ER at 90     Functional ER C7 C7       Strength/MMT:   UE MMT Right Left   Shoulder scaption (full can): 3+/5 3+/5   Shoulder IR (belly press): 3+/5 3+/5   Shoulder ER: 3+/5 3+/5            Special Tests/Clearing Screens:     Special Test Result   Speeds Positive   Hawkins kennedy Positive   Empty can Positive   Drop arm test Negative   ER lag sign Negative         - TTP: Lateral and anterior shoulder bilaterally          T Fam Both    02/19/2024 10:45 AM EDT - Fredricka by Patient   Medical History   Anemia No   Diabetes Yes   Date first noted (approx)    Comments?    Heart attack No   Anxiety Yes   Comments?    Date first noted (approx)    Emphysema No   Nerve/muscle disease Yes   Date first noted (approx)    Comments?    Arthritis Yes   Date first noted (approx)    Comments?    Frequent heartburn or acid reflux Yes   Date first noted (approx)    Comments?    Brittle bones (osteoporosis) No   Asthma Yes   Date first noted (approx)    Comments?    High cholesterol No   Glaucoma No   Seizures No   Do you have a history of cancer? No   Heart murmur No   Sickle cell disease No   Cataract No   Hepatitis No   Stroke No   Congestive heart failure (CHF) No   HIV/AIDS No   Substance abuse No   Bleeding problem (for example: hemophilia) No   High blood pressure Yes   Date first noted (approx)    Comments?    Thyroid disease No   Chronic obstructive pulmonary disease (COPD) or emphysema Yes   Date first noted (approx)    Comments?    Do you have a history of kidney disease? Yes   Date first noted (approx)    Comments?    Tuberculosis No   Depression Yes   Date first noted (approx)    Comments?  Brain/spinal cord infection (meningitis) No   Ulcers (GI) No   Gout No   Obesity Yes   Comments?    Date first noted (approx)    Surgical History   Appendix removed (appendectomy) No   C-Section No   Prostate surgery No   Brain surgery Yes   Occurrence date (approx)    Comments?    Eye surgery No   Small intestine surgery No   Breast surgery Yes   Comments?    Occurrence date (approx)    Surgery for broken bone No   Spine surgery Yes   Comments? Occurrence date (approx)    Heart bypass No   Hernia repair No   Tubes tied No   Gall bladder removal Yes   Comments?    Occurrence date (approx)    Uterus removed (hysterectomy & cervix status unknown) Yes   Comments?    Occurrence date (approx) 07/09/2001   Heart valve replacement No   Colon / large intestine surgery No   Joint replacement Yes   Comments?    Occurrence date (approx)    Vasectomy No   Plastic surgery No   Family History Refer to Family History Response table below   Tobacco Use Never   Smokeless Tobacco Never   Alcohol Use    Drug Use Never   Sexually Active Not Currently   Partners Female   Birth Control / Protection None   Comments?      Family History Response  Family Member Status Father Mother Problems Age of Onset Comments   Mother Nickey) Deceased Maternal Grandfather 2024/01/20) Maternal Grandmother Cancer -- --       Diabetes -- --       Hypertension -- --       Breast cancer -- --   Cousin Lessie) Deceased -- -- Breast cancer 45 --   Maternal Aunt Alberteen) Alive Maternal Grandfather 01/20/24) Maternal Grandmother Cancer -- --       Breast cancer 22 --   Father (Morrris) Deceased Paternal Grandfather Administrator, Civil Service) Paternal Grandmother Cancer -- --       Stomach cancer 84 --   Cousin (Tammy) Alive -- -- Breast cancer 43 --   Maternal Aunt Wallie) Alive Maternal Grandfather 2024-01-20) Maternal Grandmother Breast cancer 37 --   Maternal Aunt (Pricilla) Deceased Maternal Grandfather 2024-01-20) Maternal Grandmother Cancer 96 --   Maternal Grandfather 01-20-2024) Deceased -- -- Stroke -- --       Glaucoma -- --   Sister Tax inspector) Alive Father (Morrris) Mother Nickey) Cancer -- --       Diabetes -- --   Maternal Grandmother Deceased -- -- No Known Problems -- --   Maternal Uncle Reginold) Deceased Maternal Grandfather 2024-01-20) Maternal Grandmother Stomach cancer -- --   Paternal Aunt (Dot) Deceased Paternal Grandfather Reginold) Paternal Grandmother Cancer -- --       Stomach cancer -- --   Paternal Uncle Reginold) Deceased Paternal Grandfather Reginold) Paternal Grandmother Cancer -- --   Maternal Aunt (Dorothy) Deceased Maternal Grandfather Jan 20, 2024) Maternal Grandmother Breast cancer -- --   Neg Hx -- -- -- Alcohol abuse -- --       Drug abuse -- --       Depression -- --       Seizures -- --   Sister Rosezella) Alive Father (Morrris) Mother Nickey) COPD -- --       Diabetes -- --       Arthritis -- --   Daughter Alive --  Patient Preston) No Known Problems -- --   Paternal Grandmother Alive -- -- No Known Problems -- --   Paternal Grandfather Administrator, Civil Service) Alive -- -- Diabetes -- --       Stroke -- --   Other Alive -- -- No Known Problems -- --   Son Alive -- Patient Hoppe) -- -- --   Sister Alive Father (Morrris) Mother Nickey) -- -- --   Sister Alive Father (Morrris) Mother Nickey) -- -- --   Maternal Aunt ([place]ise [place]) Alive -- -- Cancer -- --   Maternal Aunt (Pricilla) Alive Maternal Grandfather (May) Maternal Grandmother Cancer -- --   Sister Chemical engineer) Alive Father (Morrris) Mother Nickey) Diabetes -- --       TREATMENT RENDERED     Interpreter Use: Not applicable    Therapeutic Exercise:  40 Minutes   Performed with direct PT demonstration, instruction, supervision, and guidance.   - Education on condition, prognosis, and PT POC  - HEP review as below   - NuStep seat 10 Arms 12 L4 8 min   - Shoulder flexion slides on table  - Shoulder aarom dowel flexion  - Shoulder isometric flexion   - Band pull apart OTB  - Bicep curl 2#  - Bilateral ER No monies OTB    Deferred:  - Shoulder rows GTB  - Shoulder extensions GTB      Next Visit Plan: Progress shoulder mobility and strengthening as tolerated      Total Treatment Time: 40 Minutes        Therapeutic Interventions Charges  $$ 97110 - Therapeutic Exercise [mins]: 40                 I attest that I have reviewed the above information.  Signed: Fonda Ned, PT, DPT  03/29/2024 8:10 AM        I reviewed the no-show/attendance policy with the patient and caregiver(s). The patient is aware that they must call to cancel appointments more than 24 hours in advance. They are also aware that if they late cancel or no-show three times, we reserve the right to cancel their remaining appointments. This policy is in place to allow us  to best serve the needs of our caseload.    If patient returns to clinic with variance in plan of care, then it may be attributable to one or more of the following factors: preferred clinician availability, appointment time request availability, therapy pool appointment availability, major holiday with clinic closure, caregiver availability, patient transportation, conflicting medical appointment, inclement weather, and/or patient illness.    If patient does not return for follow up visit(s) related to this episode of care, this note will serve as their discharge note from Physical Therapy.

## 2024-04-01 NOTE — Unmapped (Signed)
 Pre called and triaged. Confirmed appointment time,;location and details.

## 2024-04-02 ENCOUNTER — Other Ambulatory Visit: Payer: Self-pay | Admitting: Family Medicine

## 2024-04-02 DIAGNOSIS — I10 Essential (primary) hypertension: Secondary | ICD-10-CM

## 2024-04-02 DIAGNOSIS — K5909 Other constipation: Principal | ICD-10-CM

## 2024-04-02 NOTE — Unmapped (Signed)
 Bowel prep:   8.3oz bottle of Miralax  and mix it in 64oz of gatorade and then drink 8oz every 15 minutes until it is gone    Take Linzess  every day    Follow up at the end of the week by mychart

## 2024-04-02 NOTE — Unmapped (Signed)
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 Occupational Stress Questionnaire     Feeling of Stress : To some extent   Social Connections: Moderately Integrated (08/19/2021)    Received from [facility]    Social Connection and Isolation Panel     In a typical week, how many times do you talk on the phone with family, friends, or neighbors?: More than three times a week     How often do you get together with friends or relatives?: Once a week     How often do you attend church or religious services?: More than 4 times per year     Do you belong to any clubs or organizations such as church groups, unions, fraternal or athletic groups, or school groups?: Yes     How often do you attend meetings of the clubs or organizations you belong to?: More than 4 times per year     Are you married, widowed, divorced, separated, never married, or living with a partner?: Divorced       FAMILY HISTORY:    Family History   Problem Relation Age of Onset    Breast cancer Mother     Hypertension Mother     Diabetes Mother     Cancer Mother     Stomach cancer Father 62        unclear where primary was, died age 76    Cancer Father     Diabetes Sister     Cancer Sister     Diabetes Sister Arthritis Sister     COPD Sister     Diabetes Sister     No Known Problems Daughter     No Known Problems Maternal Grandmother     Glaucoma Maternal Grandfather     Stroke Maternal Grandfather     No Known Problems Paternal Grandmother     Stroke Paternal Grandfather     Diabetes Paternal Grandfather     Breast cancer Maternal Aunt 47    Breast cancer Maternal Aunt 35    Cancer Maternal Aunt 47        unk. primary    Breast cancer Maternal Aunt         died around age 80, unclear age of diagnosis    Breast cancer Maternal Aunt     Cancer Maternal Aunt     Breast cancer Maternal Aunt     Cancer Maternal Aunt     Stomach cancer Maternal Uncle         unsure primary, died age  68    Stomach cancer Paternal Aunt         unclear where primary was    Cancer Paternal Aunt     Cancer Paternal Uncle         back    Breast cancer Cousin 56        Died age 61. Earl's daughter.     Breast cancer Cousin 27        Treated with mastectomy. Now 51. Jo's daughter.     No Known Problems Other     ADD / ADHD Neg Hx     Alcohol abuse Neg Hx     Anxiety disorder Neg Hx     Bipolar disorder Neg Hx     Dementia Neg Hx     Depression Neg Hx     Drug abuse Neg Hx     OCD Neg Hx     Paranoid behavior Neg Hx     Physical abuse Neg Hx     Schizophrenia Neg Hx     Seizures Neg Hx     Sexual abuse Neg Hx     Colon cancer Neg Hx     Endometrial cancer Neg Hx     Ovarian cancer Neg Hx     BRCA 1/2 Neg Hx     Melanoma Neg Hx     Basal cell carcinoma Neg Hx     Squamous cell carcinoma Neg Hx            VITAL SIGNS:    BP 150/77 (BP Site: R Arm, BP Position: Sitting, BP Cuff Size: Medium)  - Pulse 66  - Ht 172.7 cm (5' 8)  - Wt 87.9 kg (193 lb 11.2 oz)  - BMI 29.45 kg/m??     PHYSICAL EXAM:  CONSTITUTIONAL: Well developed, well-nourished female in no acute distress.   EYES: conjunctivae clear; no lid lesions; pupils equal round, reactive to light; sclerae anicteric.  ENT: +mask.  NECK:  symmetric, supple.  RESPIRATORY: normal respiratory effort.  CARDIOVASCULAR: regular rate and rhythm, normal S1, S2, no murmurs, rubs, gallops; no pedal edema.  RECTAL: deferred  SKIN: no rashes, lesions  LYMPHATIC: no cervical, submandibular, or supraclavicular adenopathy.  MUSCULOSKELETAL: normal gait and station; no clubbing or cyanosis.  PSYCHIATRIC: awake, alert, and oriented to time, place, and person; insight and judgment adequate; mood and affect congruent.        This note has been created using [employer]. The note has been reviewed for accuracy, however errors may not always be identified. Such creation errors do NOT reflect on the standard of medical care rendered to this patient.

## 2024-04-04 DIAGNOSIS — R449 Unspecified symptoms and signs involving general sensations and perceptions: Principal | ICD-10-CM

## 2024-04-04 DIAGNOSIS — R2689 Other abnormalities of gait and mobility: Principal | ICD-10-CM

## 2024-04-04 NOTE — Unmapped (Signed)
 [facility]  Outpatient Neurology Clinic Electromyography  [place], [place]      Patient: Caitlin Smith  Sex: Female  [place] #: [id number]  Date of Birth: [birth year]      Visit Date:  04/04/2024 8:09 AM  Age:   68 Years  Performing MD: Camellia Penne Schwab, DO   Ref Provider:  Camellia Schwab, DO   Room:   [facility] 3  Height:  5 feet 7 inch  History:  Patient presents for further evaluation of peripheral neuropathy. Patient has a history of Lupus as well as prediabetes. 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      Sensory NCS      Nerve / Sites Rec. Site Peak Lat Ref. PP Amp Ref. Distance Vel.     ms ms ??V ??V mm m/s   R Sural - (Antidromic)      Calf Ankle 3.75 <=4.20 6.8 >=5.0 14 46   R Sup Fibular (peroneal) - Ankle      Lat leg Ankle 2.52 <=3.40 12.9 >=5.0 10 52       Motor NCS      Nerve / Sites Muscle Latency Ref. Amplitude Ref. Dur. Distance Lat Diff Velocity Ref.     ms ms mV mV ms mm ms m/s m/s   R Common Fibular (Peroneal) - EDB      Ankle EDB 3.67 <=5.47 3.5 >=2.2 6.56 7.5         B. Fib Head EDB 11.44  2.8  7.25 33 7.77 42.5 >=38.0      A. Fib Head EDB 12.98  2.7  7.23 8 1.54 51.9 >=38.0   R Tibial - AH      Ankle AH 3.48 <=5.58 13.6 >=2.8 5.40 7.5         Knee AH 12.83  6.5  7.88 45 9.35 48.1 >=41.0       F  Wave      Nerve F min Ref.    ms ms   R Common Fibular (Peroneal) - EDB NR <=56.0   R Tibial - AH 60.4 <=56.0       Needle EMG      EMG Summary Table    Spontaneous MUP Recruitment Comment   Muscle Nerve Roots Fib PSW Fasc Other Amp Dur. PPP Pattern Other   R. Tibialis anterior Deep peroneal (Fibular) L4-L5 None None None None N N N Normal None   R. Gastrocnemius (Medial head) Tibial S1-S2 None None None None N N N Normal None   R. Vastus lateralis Femoral L2-L4 None None None None N N N Normal None         Summary:  After identifying the patient in the waiting room and reviewing all appropriately available medical records, the patient was taken back to the examination room where the procedure was explained, the sites of examination were noted, the patient's questions were answered, and the patient's verbal consent for the procedure was obtained. Studies were performed using a radiant warmer and [employer]usion Synergy EMG system.    Nerve Conduction Study (NCS)  Sensory nerve conduction studies of the right sural and superficial fibular nerves demonstrate normal peak latency and normal SNAP amplitude.    Motor nerve conduction studies of the right common fibular and tibial nerves demonstrate normal distal motor latency, normal CMAP amplitude, and normal conduction velocity. There was difficulty in obtaining the popliteal fossa stimulation site for the tibial nerve, and this decrease in amplitude compared to the  distal stimulation site is likely due to technical factors. Minimal F-wave latencies are absent in the right common fibular nerve and prolonged in the right tibial nerve.    Electromyography (EMG)  Needle EMG was performed using a sterile, disposable, concentric needle electrode on select muscles of the right lower extremity as described in the EMG narrative. These muscles demonstrate normal insertional activity, no abnormal spontaneous activity at rest, normal motor unit action potential (MUAP) configuration, and normal recruitment.      Conclusions:    Normal study. There is no electrodiagnostic evidence of large fiber polyneuropathy, right leg compression neuropathy, or right lumbosacral radiculopathy.       Camellia Schwab, DO  Clinical Assistant Professor  Department of Neurology  [employer]  at [facility]        Electrodiagnostics    Date/Time: 04/04/2024 8:52 AM    Performed by: Schwab Camellia Riis, DO  Authorized by: Kennyth Lauraine Spear, ANP

## 2024-04-05 ENCOUNTER — Ambulatory Visit
Admit: 2024-04-05 | Payer: Medicare (Managed Care) | Attending: Rehabilitative and Restorative Service Providers" | Primary: Rehabilitative and Restorative Service Providers"

## 2024-04-05 NOTE — Progress Notes (Signed)
 [facility]  OUTPATIENT PHYSICAL THERAPY  04/05/2024  Note Type: Progress Note       Patient Name: Nicole Ballard  Date of Birth:12-30-1955  Diagnosis:   Encounter Diagnosis   Name Primary?    Chronic pain of both shoulders Yes     Referring Provider: Referring, Unknown Per *    Date of Onset of Impairment: 08/17/2023  Date PT Care Plan Established or Reviewed: 04/05/2024  Date PT Treatment Started: 02/15/2024     Plan of Care Effective Date: 04/05/2024 - 07/03/2024  Session Number:  4     ASSESSMENT & PLAN   Assessment  Assessment details:    Today: Derionna is a pleasant 68 y.o. female who presents for Physical Therapy Evaluation with chronic B shoulder pain with history of fibromyalgia. Patient has completed 4 visits of skilled PT services with good results. Patient has displayed improvement in shoulder ROM and strengthening with slightly decreased discomfort. Patient continues to be challenged with overhead strengthening with pain increasing > 150 deg of flexion.  The patient will benefit from skilled Physical Therapy intervention to address the moderate impairments listed below and to assist the patient in maximizing her functional independence and safe return to prior level of function.       Evaluation:  Dalina is a pleasant 68 y.o. female who presents for Physical Therapy Evaluation with chronic B shoulder pain with history of fibromyalgia. Primary impairments include difficulty with lifting, reaching, pulling, overhead motions, pushing, household chores and donning and doffing clothing. Clinical presentation today is consistent with B shoulder pain with mobility deficits and weakness.  The patient will benefit from skilled Physical Therapy intervention to address the moderate impairments listed below and to assist the patient in maximizing her functional independence and safe return to prior level of function.         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    Clinical Presentation: evolving    Therapy Goals      Goals:        1. In 12 weeks the patient will demonstrate independent performance of HEP to maintain functional gains. - Progressing  2. In 12 weeks the patient will lift 5# to with B shoulder 10 times to be able to lift gallon of milk up to the counter at home. - Able to lift 2#  3. In 12 weeks, patient will improve shoulder range of motion to full without increase in symptoms for safe return to workout regimen. - Progressing  4. In 12 weeks the patient will score a 18.1 point change on the SPADI to demonstrate the [facility] for MSK  conditions (0-100; lower score indicates a lesser level of disability) and to ability to complete laundry without need to frequent rest breaks. - Progressing        Plan    Therapy options: will be seen for skilled physical therapy services    Planned therapy interventions: Balance Training, Education - Patient, Endurance Activites, Functional Mobility, Home Exercise Program, Education - Family/Caregiver, E-Stim, [employer], Civil engineer, contracting, Diaphragmatic/Pursed-lip Breathing, 97113-Aquatic Therapy, 97112-Neuromuscular Re-education, 97110-Therapeutic Exercises, 97116-Gait Training, 97140-Manual Therapy, 97530-Therapeutic Activities, 97535-Self-Care/Home Training, 97750-Physical Performance Test and 79439, 20561-Dry Needling 1-2, 3+ areas    DME Equipment: [employer].    Frequency: 1x week    Duration in weeks: 12    Education provided to: patient.    Education provided: HEP, Treatment options and plan, Symptom management, Safety education, Importance of Therapy, Anatomy, Body mechanics, Role of therapy in Rehabilitation, Posture, Community resources and Body awareness    Education results: verbalized good understanding, demonstrates understanding and needs reinforcement.    Communication/Consultation: Medicare Cert/POC sent to Referring Provider.      Total Session Time: 40    Treatment rendered today:      40        SUBJECTIVE         History of Present Condition     Date of onset:  12/16/2023    History of Present Condition/Chief Complaint:    Associated Diagnoses    Rotator cuff arthropathy of both shoulders [M12.811, M12.812]      Reason for Exam    Comments: Bilateral shoulder pain. ROM intact. Diffuse pain including trapezius. Working on stretches and exercises to reduce pain.      Subjective:  Today: Maybe a little bit better this week. I felt like I was stumbling a little bit last week.     Evaluation: Patient returns to PT with primary complaint now being both shoulders.She reports pain in both shoulders that is a constant aching. She would be interested in therapeutic injection to shoulders.    Started about 2 months ago. Thought it was my fibromyalgia. Feels at the front of the shoulder and goes to the back. Pain can be sore down to elbows and feel some in the back.Pain does not go up it neck.  Getting worse over the last 2 months.  Pain:     Current pain rating:  5    At best pain rating:  3    At worst pain rating:  8  Location:  Both shoulders    Quality:  Aching    Relieving factors:  Rest    Aggravating factors:  Lifting, performance of arm dominant activites, overhead activity, pushing, reaching and pulling    Pain related Behaviors:  Avoidance    Progression:  No change    Red Flags:  None  Precautions/Equipment   Precautions:  None    Current Braces/Orthoses:  None    Equipment Currently Used:  None  Prior Functional Status     No physical limitations    Current Functional Status:    limited exercise, limited lifting, limited recreation, limited household activities and limited work capacity  Social Support:   Barriers to Learning:  No Barriers  Diagnostic Tests:     None    Treatments:     None      Current treatment: physical therapy    Patient Goals:     Patient/Family goals for therapy:  Decreased pain, increased ROM, increased strength, independence with ADLs/IADLs, improve overhead reaching, return to  sport/leisure activities and return to recreational activites      Hunger vital sign:  1. Within the past 12 months, we worried whether our food would run out before we got money to buy more. Never True  2. Within the past 12 months, the food we bought just didn't last, and we didn't have money to get more. Never True    If patient identifies with either of the above, patient was provided with local food resource guide and non perishables when possible.    OBJECTIVE     Functional Test/Outcome Measures:  Total SPADI Score: 86 / 130 = 66.2 %  02/15/24    Posture/Observations:   Sitting: rounded shoulders, foward head and increased thoracic kyphosis  Standing: foward flexed posture, rounded shoulders, foward head and increased thoracic kyphosis    Cervical ROM Screen:  [facility]    Range of Motion:   Motion Right Left   Shoulder Flexion 132 - pain 150 -  pain   Shoulder Abduction 160 -  pain 160 -  pain   Shoulder IR at 90     Functional IR T8 L1   Shoulder ER at 90     Functional ER C7 C7       Strength/MMT:   UE MMT Right Left   Shoulder scaption (full can): 4-/5 4-/5   Shoulder IR (belly press): 4-/5 4-/5   Shoulder ER: 4-/5 4-/5            Special Tests/Clearing Screens:     Special Test Result   Speeds Positive   Hawkins kennedy Positive   Empty can Positive   Drop arm test Negative   ER lag sign Negative         - TTP: Lateral and anterior shoulder bilaterally          T Fam Both    02/19/2024 10:45 AM EDT - Fredricka by Patient   Medical History   Anemia No   Diabetes Yes   Date first noted (approx)    Comments?    Heart attack No   Anxiety Yes   Comments?    Date first noted (approx)    Emphysema No   Nerve/muscle disease Yes   Date first noted (approx)    Comments?    Arthritis Yes   Date first noted (approx)    Comments?    Frequent heartburn or acid reflux Yes   Date first noted (approx)    Comments?    Brittle bones (osteoporosis) No   Asthma Yes   Date first noted (approx)    Comments?    High cholesterol No   Glaucoma No   Seizures No   Do you have a history of cancer? No   Heart murmur No   Sickle cell disease No   Cataract No   Hepatitis No   Stroke No   Congestive heart failure (CHF) No   HIV/AIDS No   Substance abuse No   Bleeding problem (for example: hemophilia) No   High blood pressure Yes   Date first noted (approx)    Comments?    Thyroid  disease No   Chronic obstructive pulmonary disease (COPD) or emphysema Yes   Date first noted (approx)    Comments?    Do you have a history of kidney disease? Yes   Date first noted (approx)    Comments?    Tuberculosis No   Depression Yes   Date first noted (approx)    Comments?  Brain/spinal cord infection (meningitis) No   Ulcers (GI) No   Gout No   Obesity Yes   Comments?    Date first noted (approx)    Surgical History   Appendix removed (appendectomy) No   C-Section No   Prostate surgery No   Brain surgery Yes   Occurrence date (approx)    Comments?    Eye surgery No   Small intestine surgery No   Breast surgery Yes   Comments?    Occurrence date (approx)    Surgery for broken bone No Spine surgery Yes   Comments?    Occurrence date (approx)    Heart bypass No   Hernia repair No   Tubes tied No   Gall bladder removal Yes   Comments?    Occurrence date (approx)    Uterus removed (hysterectomy & cervix status unknown) Yes   Comments?    Occurrence date (approx) 07/09/2001   Heart valve replacement No   Colon / large intestine surgery No   Joint replacement Yes   Comments?    Occurrence date (approx)    Vasectomy No   Plastic surgery No   Family History Refer to Family History Response table below   Tobacco Use Never   Smokeless Tobacco Never   Alcohol Use    Drug Use Never   Sexually Active Not Currently   Partners Female   Birth Control / Protection None   Comments?      Family History Response  Family Member Status Father Mother Problems Age of Onset Comments   Mother Nickey) Deceased Maternal Grandfather 02-19-2024) Maternal Grandmother Cancer -- --       Diabetes -- --       Hypertension -- --       Breast cancer -- --   Cousin Lessie) Deceased -- -- Breast cancer 45 --   Maternal Aunt Alberteen) Alive Maternal Grandfather 02-19-24) Maternal Grandmother Cancer -- --       Breast cancer 34 --   Father (Morrris) Deceased Paternal Grandfather Administrator, Civil Service) Paternal Grandmother Cancer -- --       Stomach cancer 18 --   Cousin (Tammy) Alive -- -- Breast cancer 43 --   Maternal Aunt Wallie) Alive Maternal Grandfather 02-19-2024) Maternal Grandmother Breast cancer 36 --   Maternal Aunt (Pricilla) Deceased Maternal Grandfather February 19, 2024) Maternal Grandmother Cancer 78 --   Maternal Grandfather 02/19/24) Deceased -- -- Stroke -- --       Glaucoma -- --   Sister Tax inspector) Alive Father (Morrris) Mother Nickey) Cancer -- --       Diabetes -- --   Maternal Grandmother Deceased -- -- No Known Problems -- --   Maternal Uncle Reginold) Deceased Maternal Grandfather Feb 19, 2024) Maternal Grandmother Stomach cancer -- --   Paternal Aunt (Dot) Deceased Paternal Grandfather Reginold) Paternal Grandmother Cancer -- --       Stomach cancer -- --   Paternal Uncle Reginold) Deceased Paternal Grandfather Reginold) Paternal Grandmother Cancer -- --   Maternal Aunt (Dorothy) Deceased Maternal Grandfather 02-19-2024) Maternal Grandmother Breast cancer -- --   Neg Hx -- -- -- Alcohol abuse -- --       Drug abuse -- --       Depression -- --       Seizures -- --   Sister Rosezella) Alive Father (Morrris) Mother Nickey) COPD -- --       Diabetes -- --       Arthritis -- --   Daughter  Alive -- Patient Paganelli) No Known Problems -- --   Paternal Grandmother Alive -- -- No Known Problems -- --   Paternal Grandfather Administrator, Civil Service) Alive -- -- Diabetes -- --       Stroke -- --   Other Alive -- -- No Known Problems -- --   Son Alive -- Patient Estrella) -- -- --   Sister Alive Father (Morrris) Mother Nickey) -- -- --   Sister Alive Father (Morrris) Mother Nickey) -- -- --   Maternal Aunt (Louise Philo) Alive -- -- Cancer -- --   Maternal Aunt (Pricilla) Alive Maternal Grandfather (May) Maternal Grandmother Cancer -- --   Sister Chemical engineer) Alive Father (Morrris) Mother Nickey) Diabetes -- --       TREATMENT RENDERED     Interpreter Use: Not applicable    Therapeutic Exercise:  40 Minutes   Performed with direct PT demonstration, instruction, supervision, and guidance.   - Education on condition, prognosis, and PT POC  - HEP review as below   - NuStep seat 10 Arms 12 L4 8 min   - Shoulder flexion slides on table  - Shoulder aarom dowel flexion  - Shoulder flexion OTB  - Shoulder dowel ER      Deferred:  - Shoulder isometric flexion   - Band pull apart OTB  - Bicep curl 2#  - Bilateral ER No monies OTB  - Shoulder rows GTB  - Shoulder extensions GTB      Next Visit Plan: Progress shoulder mobility and strengthening as tolerated      Total Treatment Time: 40 Minutes        Therapeutic Interventions Charges  $$ 97110 - Therapeutic Exercise [mins]: 40                 I attest that I have reviewed the above information.  Signed: Fonda Ned, PT, DPT  04/05/2024 8:10 AM        I reviewed the no-show/attendance policy with the patient and caregiver(s). The patient is aware that they must call to cancel appointments more than 24 hours in advance. They are also aware that if they late cancel or no-show three times, we reserve the right to cancel their remaining appointments. This policy is in place to allow us  to best serve the needs of our caseload.    If patient returns to clinic with variance in plan of care, then it may be attributable to one or more of the following factors: preferred clinician availability, appointment time request availability, therapy pool appointment availability, major holiday with clinic closure, caregiver availability, patient transportation, conflicting medical appointment, inclement weather, and/or patient illness.    If patient does not return for follow up visit(s) related to this episode of care, this note will serve as their discharge note from Physical Therapy.

## 2024-04-05 NOTE — Unmapped (Signed)
 [facility] PT [facility] [place]  OUTPATIENT PHYSICAL THERAPY  04/05/2024  Note Type: Progress Note       Patient Name: Caitlin Smith  Date of Birth:October 10, 1955  Diagnosis:   Encounter Diagnosis   Name Primary?    Chronic pain of both shoulders Yes     Referring Provider: Referring, Unknown Per *    Date of Onset of Impairment: 08/17/2023  Date PT Care Plan Established or Reviewed: 04/05/2024  Date PT Treatment Started: 02/15/2024     Plan of Care Effective Date: 04/05/2024 - 07/03/2024  Session Number:  4     ASSESSMENT & PLAN   Assessment  Assessment details:    Today: Caitlin Smith is a pleasant 68 y.o. female who presents for Physical Therapy Evaluation with chronic B shoulder pain with history of fibromyalgia. Patient has completed 4 visits of skilled PT services with good results. Patient has displayed improvement in shoulder ROM and strengthening with slightly decreased discomfort. Patient continues to be challenged with overhead strengthening with pain increasing > 150 deg of flexion.  The patient will benefit from skilled Physical Therapy intervention to address the moderate impairments listed below and to assist the patient in maximizing her functional independence and safe return to prior level of function.       Evaluation:  Caitlin Smith is a pleasant 68 y.o. female who presents for Physical Therapy Evaluation with chronic B shoulder pain with history of fibromyalgia. Primary impairments include difficulty with lifting, reaching, pulling, overhead motions, pushing, household chores and donning and doffing clothing. Clinical presentation today is consistent with B shoulder pain with mobility deficits and weakness.  The patient will benefit from skilled Physical Therapy intervention to address the moderate impairments listed below and to assist the patient in maximizing her functional independence and safe return to prior level of function.         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    Clinical Presentation: evolving    Therapy Goals      Goals:        1. In 12 weeks the patient will demonstrate independent performance of HEP to maintain functional gains. - Progressing  2. In 12 weeks the patient will lift 5# to with B shoulder 10 times to be able to lift gallon of milk up to the counter at home. - Able to lift 2#  3. In 12 weeks, patient will improve shoulder range of motion to full without increase in symptoms for safe return to workout regimen. - Progressing  4. In 12 weeks the patient will score a 18.1 point change on the SPADI to demonstrate the [facility] for MSK  conditions (0-100; lower score indicates a lesser level of disability) and to ability to complete laundry without need to frequent rest breaks. - Progressing        Plan    Therapy options: will be seen for skilled physical therapy services    Planned therapy interventions: Balance Training, Education - Patient, Endurance Activites, Functional Mobility, Home Exercise Program, Education - Family/Caregiver, E-Stim, [employer], Civil engineer, contracting, Diaphragmatic/Pursed-lip Breathing, 97113-Aquatic Therapy, 97112-Neuromuscular Re-education, 97110-Therapeutic Exercises, 97116-Gait Training, 97140-Manual Therapy, 97530-Therapeutic Activities, 97535-Self-Care/Home Training, 97750-Physical Performance Test and 79439, 20561-Dry Needling 1-2, 3+ areas    DME Equipment: [employer].    Frequency: 1x week    Duration in weeks: 12    Education provided to: patient.    Education provided: HEP, Treatment options and plan, Symptom management, Safety education, Importance of Therapy, Anatomy, Body mechanics, Role of therapy in Rehabilitation, Posture, Community resources and Body awareness    Education results: verbalized good understanding, demonstrates understanding and needs reinforcement.    Communication/Consultation: Medicare Cert/POC sent to Referring Provider.      Total Session Time: 40    Treatment rendered today:      40        SUBJECTIVE         History of Present Condition     Date of onset:  12/16/2023    History of Present Condition/Chief Complaint:    Associated Diagnoses    Rotator cuff arthropathy of both shoulders [M12.811, M12.812]      Reason for Exam    Comments: Bilateral shoulder pain. ROM intact. Diffuse pain including trapezius. Working on stretches and exercises to reduce pain.      Subjective:  Today: Maybe a little bit better this week. I felt like I was stumbling a little bit last week.     Evaluation: Patient returns to PT with primary complaint now being both shoulders.She reports pain in both shoulders that is a constant aching. She would be interested in therapeutic injection to shoulders.    Started about 2 months ago. Thought it was my fibromyalgia. Feels at the front of the shoulder and goes to the back. Pain can be sore down to elbows and feel some in the back.Pain does not go up it neck.  Getting worse over the last 2 months.  Pain:     Current pain rating:  5    At best pain rating:  3    At worst pain rating:  8  Location:  Both shoulders    Quality:  Aching    Relieving factors:  Rest    Aggravating factors:  Lifting, performance of arm dominant activites, overhead activity, pushing, reaching and pulling    Pain related Behaviors:  Avoidance    Progression:  No change    Red Flags:  None  Precautions/Equipment   Precautions:  None    Current Braces/Orthoses:  None    Equipment Currently Used:  None  Prior Functional Status     No physical limitations    Current Functional Status:    limited exercise, limited lifting, limited recreation, limited household activities and limited work capacity  Social Support:   Barriers to Learning:  No Barriers  Diagnostic Tests:     None    Treatments:     None      Current treatment: physical therapy    Patient Goals:     Patient/Family goals for therapy:  Decreased pain, increased ROM, increased strength, independence with ADLs/IADLs, improve overhead reaching, return to  sport/leisure activities and return to recreational activites      Hunger vital sign:  1. Within the past 12 months, we worried whether our food would run out before we got money to buy more. Never True  2. Within the past 12 months, the food we bought just didn't last, and we didn't have money to get more. Never True    If patient identifies with either of the above, patient was provided with local food resource guide and non perishables when possible.    OBJECTIVE     Functional Test/Outcome Measures:  Total SPADI Score: 86 / 130 = 66.2 %  02/15/24    Posture/Observations:   Sitting: rounded shoulders, foward head and increased thoracic kyphosis  Standing: foward flexed posture, rounded shoulders, foward head and increased thoracic kyphosis    Cervical ROM Screen:  [facility]    Range of Motion:   Motion Right Left   Shoulder Flexion 132 - pain 150 -  pain   Shoulder Abduction 160 -  pain 160 -  pain   Shoulder IR at 90     Functional IR T8 L1   Shoulder ER at 90     Functional ER C7 C7       Strength/MMT:   UE MMT Right Left   Shoulder scaption (full can): 4-/5 4-/5   Shoulder IR (belly press): 4-/5 4-/5   Shoulder ER: 4-/5 4-/5            Special Tests/Clearing Screens:     Special Test Result   Speeds Positive   Hawkins kennedy Positive   Empty can Positive   Drop arm test Negative   ER lag sign Negative         - TTP: Lateral and anterior shoulder bilaterally          T Fam Both    02/19/2024 10:45 AM EDT - Fredricka by Patient   Medical History   Anemia No   Diabetes Yes   Date first noted (approx)    Comments?    Heart attack No   Anxiety Yes   Comments?    Date first noted (approx)    Emphysema No   Nerve/muscle disease Yes   Date first noted (approx)    Comments?    Arthritis Yes   Date first noted (approx)    Comments?    Frequent heartburn or acid reflux Yes   Date first noted (approx)    Comments?    Brittle bones (osteoporosis) No   Asthma Yes   Date first noted (approx)    Comments?    High cholesterol No   Glaucoma No   Seizures No   Do you have a history of cancer? No   Heart murmur No   Sickle cell disease No   Cataract No   Hepatitis No   Stroke No   Congestive heart failure (CHF) No   HIV/AIDS No   Substance abuse No   Bleeding problem (for example: hemophilia) No   High blood pressure Yes   Date first noted (approx)    Comments?    Thyroid disease No   Chronic obstructive pulmonary disease (COPD) or emphysema Yes   Date first noted (approx)    Comments?    Do you have a history of kidney disease? Yes   Date first noted (approx)    Comments?    Tuberculosis No   Depression Yes   Date first noted (approx)    Comments?  Brain/spinal cord infection (meningitis) No   Ulcers (GI) No   Gout No   Obesity Yes   Comments?    Date first noted (approx)    Surgical History   Appendix removed (appendectomy) No   C-Section No   Prostate surgery No   Brain surgery Yes   Occurrence date (approx)    Comments?    Eye surgery No   Small intestine surgery No   Breast surgery Yes   Comments?    Occurrence date (approx)    Surgery for broken bone No Spine surgery Yes   Comments?    Occurrence date (approx)    Heart bypass No   Hernia repair No   Tubes tied No   Gall bladder removal Yes   Comments?    Occurrence date (approx)    Uterus removed (hysterectomy & cervix status unknown) Yes   Comments?    Occurrence date (approx) 07/09/2001   Heart valve replacement No   Colon / large intestine surgery No   Joint replacement Yes   Comments?    Occurrence date (approx)    Vasectomy No   Plastic surgery No   Family History Refer to Family History Response table below   Tobacco Use Never   Smokeless Tobacco Never   Alcohol Use    Drug Use Never   Sexually Active Not Currently   Partners Female   Birth Control / Protection None   Comments?      Family History Response  Family Member Status Father Mother Problems Age of Onset Comments   Mother Nickey) Deceased Maternal Grandfather Jan 22, 2024) Maternal Grandmother Cancer -- --       Diabetes -- --       Hypertension -- --       Breast cancer -- --   Cousin Lessie) Deceased -- -- Breast cancer 45 --   Maternal Aunt Alberteen) Alive Maternal Grandfather 2024/01/22) Maternal Grandmother Cancer -- --       Breast cancer 51 --   Father (Morrris) Deceased Paternal Grandfather Administrator, Civil Service) Paternal Grandmother Cancer -- --       Stomach cancer 20 --   Cousin (Tammy) Alive -- -- Breast cancer 43 --   Maternal Aunt Wallie) Alive Maternal Grandfather 01/22/24) Maternal Grandmother Breast cancer 63 --   Maternal Aunt (Pricilla) Deceased Maternal Grandfather 22-Jan-2024) Maternal Grandmother Cancer 54 --   Maternal Grandfather 01-22-24) Deceased -- -- Stroke -- --       Glaucoma -- --   Sister Tax inspector) Alive Father (Morrris) Mother Nickey) Cancer -- --       Diabetes -- --   Maternal Grandmother Deceased -- -- No Known Problems -- --   Maternal Uncle Reginold) Deceased Maternal Grandfather Jan 22, 2024) Maternal Grandmother Stomach cancer -- --   Paternal Aunt (Dot) Deceased Paternal Grandfather Reginold) Paternal Grandmother Cancer -- --       Stomach cancer -- --   Paternal Uncle Reginold) Deceased Paternal Grandfather Reginold) Paternal Grandmother Cancer -- --   Maternal Aunt (Dorothy) Deceased Maternal Grandfather 01-22-24) Maternal Grandmother Breast cancer -- --   Neg Hx -- -- -- Alcohol abuse -- --       Drug abuse -- --       Depression -- --       Seizures -- --   Sister Rosezella) Alive Father (Morrris) Mother Nickey) COPD -- --       Diabetes -- --       Arthritis -- --   Daughter  Alive -- Patient Fiallo) No Known Problems -- --   Paternal Grandmother Alive -- -- No Known Problems -- --   Paternal Grandfather Administrator, Civil Service) Alive -- -- Diabetes -- --       Stroke -- --   Other Alive -- -- No Known Problems -- --   Son Alive -- Patient Bralley) -- -- --   Sister Alive Father (Morrris) Mother Nickey) -- -- --   Sister Alive Father (Morrris) Mother Nickey) -- -- --   Maternal Aunt (Louise Zurich) Alive -- -- Cancer -- --   Maternal Aunt (Pricilla) Alive Maternal Grandfather (May) Maternal Grandmother Cancer -- --   Sister Chemical engineer) Alive Father (Morrris) Mother Nickey) Diabetes -- --       TREATMENT RENDERED     Interpreter Use: Not applicable    Therapeutic Exercise:  40 Minutes   Performed with direct PT demonstration, instruction, supervision, and guidance.   - Education on condition, prognosis, and PT POC  - HEP review as below   - NuStep seat 10 Arms 12 L4 8 min   - Shoulder flexion slides on table  - Shoulder aarom dowel flexion  - Shoulder flexion OTB  - Shoulder dowel ER      Deferred:  - Shoulder isometric flexion   - Band pull apart OTB  - Bicep curl 2#  - Bilateral ER No monies OTB  - Shoulder rows GTB  - Shoulder extensions GTB      Next Visit Plan: Progress shoulder mobility and strengthening as tolerated      Total Treatment Time: 40 Minutes        Therapeutic Interventions Charges  $$ 97110 - Therapeutic Exercise [mins]: 40                 I attest that I have reviewed the above information.  Signed: Fonda Ned, PT, DPT  04/05/2024 8:10 AM        I reviewed the no-show/attendance policy with the patient and caregiver(s). The patient is aware that they must call to cancel appointments more than 24 hours in advance. They are also aware that if they late cancel or no-show three times, we reserve the right to cancel their remaining appointments. This policy is in place to allow us  to best serve the needs of our caseload.    If patient returns to clinic with variance in plan of care, then it may be attributable to one or more of the following factors: preferred clinician availability, appointment time request availability, therapy pool appointment availability, major holiday with clinic closure, caregiver availability, patient transportation, conflicting medical appointment, inclement weather, and/or patient illness.    If patient does not return for follow up visit(s) related to this episode of care, this note will serve as their discharge note from Physical Therapy.

## 2024-04-17 MED ORDER — CLONAZEPAM 0.5 MG TABLET
ORAL_TABLET | Freq: Three times a day (TID) | ORAL | 1 refills | 20.00000 days | PRN
Start: 2024-04-17 — End: ?

## 2024-04-18 ENCOUNTER — Ambulatory Visit (INDEPENDENT_AMBULATORY_CARE_PROVIDER_SITE_OTHER): Admitting: Family Medicine

## 2024-04-18 ENCOUNTER — Encounter: Payer: Self-pay | Admitting: Family Medicine

## 2024-04-18 VITALS — BP 122/70 | HR 76 | Resp 16 | Ht 66.13 in | Wt 188.7 lb

## 2024-04-18 DIAGNOSIS — Z0000 Encounter for general adult medical examination without abnormal findings: Secondary | ICD-10-CM

## 2024-04-18 DIAGNOSIS — Z1231 Encounter for screening mammogram for malignant neoplasm of breast: Secondary | ICD-10-CM | POA: Diagnosis not present

## 2024-04-18 DIAGNOSIS — Z13820 Encounter for screening for osteoporosis: Secondary | ICD-10-CM | POA: Diagnosis not present

## 2024-04-18 MED ORDER — CLONAZEPAM 0.5 MG TABLET
ORAL_TABLET | Freq: Three times a day (TID) | ORAL | 1 refills | 20.00000 days | Status: CP | PRN
Start: 2024-04-18 — End: ?

## 2024-04-18 NOTE — Patient Instructions (Signed)
 Preventive Care 83 Years and Older, Female Preventive care refers to lifestyle choices and visits with your health care provider that can promote health and wellness. Preventive care visits are also called wellness exams. What can I expect for my preventive care visit? Counseling Your health care provider may ask you questions about your: Medical history, including: Past medical problems. Family medical history. Pregnancy and menstrual history. History of falls. Current health, including: Memory and ability to understand (cognition). Emotional well-being. Home life and relationship well-being. Sexual activity and sexual health. Lifestyle, including: Alcohol, nicotine or tobacco, and drug use. Access to firearms. Diet, exercise, and sleep habits. Work and work Astronomer. Sunscreen use. Safety issues such as seatbelt and bike helmet use. Physical exam Your health care provider will check your: Height and weight. These may be used to calculate your BMI (body mass index). BMI is a measurement that tells if you are at a healthy weight. Waist circumference. This measures the distance around your waistline. This measurement also tells if you are at a healthy weight and may help predict your risk of certain diseases, such as type 2 diabetes and high blood pressure. Heart rate and blood pressure. Body temperature. Skin for abnormal spots. What immunizations do I need?  Vaccines are usually given at various ages, according to a schedule. Your health care provider will recommend vaccines for you based on your age, medical history, and lifestyle or other factors, such as travel or where you work. What tests do I need? Screening Your health care provider may recommend screening tests for certain conditions. This may include: Lipid and cholesterol levels. Hepatitis C test. Hepatitis B test. HIV (human immunodeficiency virus) test. STI (sexually transmitted infection) testing, if you are at  risk. Lung cancer screening. Colorectal cancer screening. Diabetes screening. This is done by checking your blood sugar (glucose) after you have not eaten for a while (fasting). Mammogram. Talk with your health care provider about how often you should have regular mammograms. BRCA-related cancer screening. This may be done if you have a family history of breast, ovarian, tubal, or peritoneal cancers. Bone density scan. This is done to screen for osteoporosis. Talk with your health care provider about your test results, treatment options, and if necessary, the need for more tests. Follow these instructions at home: Eating and drinking  Eat a diet that includes fresh fruits and vegetables, whole grains, lean protein, and low-fat dairy products. Limit your intake of foods with high amounts of sugar, saturated fats, and salt. Take vitamin and mineral supplements as recommended by your health care provider. Do not drink alcohol if your health care provider tells you not to drink. If you drink alcohol: Limit how much you have to 0-1 drink a day. Know how much alcohol is in your drink. In the [place] one drink equals one 12 oz bottle of beer (355 mL), one 5 oz glass of wine (148 mL), or one 1 oz glass of hard liquor (44 mL). Lifestyle Brush your teeth every morning and night with fluoride toothpaste. Floss one time each day. Exercise for at least 30 minutes 5 or more days each week. Do not use any products that contain nicotine or tobacco. These products include cigarettes, chewing tobacco, and vaping devices, such as e-cigarettes. If you need help quitting, ask your health care provider. Do not use drugs. If you are sexually active, practice safe sex. Use a condom or other form of protection in order to prevent STIs. Take aspirin only as told by  your health care provider. Make sure that you understand how much to take and what form to take. Work with your health care provider to find out whether it  is safe and beneficial for you to take aspirin daily. Ask your health care provider if you need to take a cholesterol-lowering medicine (statin). Find healthy ways to manage stress, such as: Meditation, yoga, or listening to music. Journaling. Talking to a trusted person. Spending time with friends and family. Minimize exposure to UV radiation to reduce your risk of skin cancer. Safety Always wear your seat belt while driving or riding in a vehicle. Do not drive: If you have been drinking alcohol. Do not ride with someone who has been drinking. When you are tired or distracted. While texting. If you have been using any mind-altering substances or drugs. Wear a helmet and other protective equipment during sports activities. If you have firearms in your house, make sure you follow all gun safety procedures. What's next? Visit your health care provider once a year for an annual wellness visit. Ask your health care provider how often you should have your eyes and teeth checked. Stay up to date on all vaccines. This information is not intended to replace advice given to you by your health care provider. Make sure you discuss any questions you have with your health care provider. Document Revised: 02/17/2021 Document Reviewed: 02/17/2021 Elsevier Patient Education  2024 [employer].

## 2024-04-18 NOTE — Progress Notes (Signed)
 Name: Nicole Ballard   MRN: [id number]    DOB: 1955-11-18   Date:04/18/2024       Progress Note  Subjective  Chief Complaint  Chief Complaint  Patient presents with   Annual Exam    HPI  Patient presents for annual CPE.  Diet: tries to eat balanced diet  Exercise: discussed regular walks  Last Eye Exam: completed Last Dental Exam: completed  Flowsheet Row Office Visit from 04/18/2024 in [facility]  AUDIT-C Score 0   Depression: Phq 9 is  negative    04/18/2024    9:08 AM 01/09/2024    9:14 AM 10/10/2023    3:32 PM 08/09/2023    2:49 PM 07/24/2023    1:06 PM  Depression screen PHQ 2/9  Decreased Interest 1 1 0 2 0  Down, Depressed, Hopeless 1 1 0 2 0  PHQ - 2 Score 2 2 0 4 0  Altered sleeping 1 1 0 1 0  Tired, decreased energy 1 1 0 2 0  Change in appetite 1 1 0 2 0  Feeling bad or failure about yourself  0 0 0 0 0  Trouble concentrating 0 0 0 1 0  Moving slowly or fidgety/restless 0 0 0 0 0  Suicidal thoughts 0 0 0 0 0  PHQ-9 Score 5 5 0 10 0  Difficult doing work/chores Somewhat difficult Somewhat difficult Not difficult at all Somewhat difficult Not difficult at all   Hypertension: BP Readings from Last 3 Encounters:  04/18/24 122/70  01/09/24 116/72  12/25/23 (!) 169/83   Obesity: Wt Readings from Last 3 Encounters:  04/18/24 188 lb 11.2 oz (85.6 kg)  01/09/24 189 lb 12.8 oz (86.1 kg)  12/25/23 181 lb 10.5 oz (82.4 kg)   BMI Readings from Last 3 Encounters:  04/18/24 30.34 kg/m  01/09/24 29.73 kg/m  12/25/23 28.45 kg/m     Vaccines: reviewed with the patient.   Hep C Screening: completed STD testing and prevention (HIV/chl/gon/syphilis): N/A Intimate partner violence: negative screen  Sexual History : not sexually active  Menstrual History/LMP/Abnormal Bleeding: hysterectomy years ago  Discussed importance of follow up if any post-menopausal bleeding: not applicable  Incontinence Symptoms: positive for symptoms -  stress incontinence when coughing a lot   Breast cancer:  - Last Mammogram: up to date  - BRCA gene screening: had genetic testing   Osteoporosis Prevention : Discussed high calcium  and vitamin D  supplementation, weight bearing exercises Bone density :yes   Cervical cancer screening: no cervix - s/p hysterectomy   Skin cancer: Discussed monitoring for atypical lesions  Colorectal cancer: up to date   Lung cancer:  Low Dose CT Chest recommended if Age 25-80 years, 20 pack-year currently smoking OR have quit w/in 15years. Patient does not qualify for screen   ECG: 12/2023  Advanced Care Planning: A voluntary discussion about advance care planning including the explanation and discussion of advance directives.  Discussed health care proxy and Living will, and the patient was able to identify a health care proxy as children .  Patient does not have a living will and power of attorney of health care   Patient Active Problem List   Diagnosis Date Noted   Lumbar facet arthropathy 10/02/2023   Genetic testing 03/14/2023   Abnormal SPEP 01/13/2023   Weight loss 01/13/2023   Family history of breast cancer 01/13/2023   Asthma, chronic, severe persistent, uncomplicated 10/12/2022   Interstitial lung disease (HCC) 07/12/2022   Empty sella (  HCC) 07/12/2022   History of craniotomy 07/12/2022   Immunocompromised (HCC) 04/29/2021   Hyperglycemia 10/23/2017   Vertigo 09/29/2016   Bronchiolitis obliterans organizing pneumonia (HCC) 09/29/2016   Neck pain, musculoskeletal 12/15/2015   Cough in adult 09/11/2015   Abnormal CAT scan 04/11/2015   Auditory impairment 04/11/2015   Insomnia, persistent 04/11/2015   Chronic kidney disease (CKD), stage I 04/11/2015   Chronic nonmalignant pain 04/11/2015   Chronic constipation 04/11/2015   Dyslipidemia 04/11/2015   Fibromyalgia 04/11/2015   Gastro-esophageal reflux disease without esophagitis 04/11/2015   Dysphonia 04/11/2015   Low back pain  04/11/2015   Eczema intertrigo 04/11/2015   Keratosis pilaris 04/11/2015   Chronic recurrent major depressive disorder (HCC) 04/11/2015   Dysmetabolic syndrome 04/11/2015   Neurogenic claudication 04/11/2015   Perennial allergic rhinitis with seasonal variation 04/11/2015   Abnormal presence of protein in urine 04/11/2015   Seborrhea capitis 04/11/2015   Moderate dysplasia of vulva 04/11/2015   Vitamin D  deficiency 04/11/2015   Spinal stenosis of lumbar region 04/11/2015   Treadmill stress test negative for angina pectoris 03/05/2015   Shortness of breath 05/20/2014   H/O total knee replacement 12/31/2013   Episcleritis 09/26/2013   Vocal cord dysfunction 05/28/2013   Anxiety, generalized 03/19/2013   Back pain 12/18/2012   Pulmonary nodules 11/26/2012   Lung nodule, multiple 11/26/2012   Arthritis, degenerative 11/01/2012   H/O aspiration pneumonitis 10/22/2012   Postinflammatory pulmonary fibrosis (HCC) 10/22/2012   Mixed incontinence 05/04/2012   Lung involvement in systemic lupus erythematosus (HCC) 11/05/2010   Chronic nausea 07/01/2008   Benign hypertension 01/25/2005    Past Surgical History:  Procedure Laterality Date   BRAIN SURGERY     CHOLECYSTECTOMY     VAGINAL HYSTERECTOMY     vulvar surgery      Family History  Problem Relation Age of Onset   Diabetes Mother    Hypertension Mother    Breast cancer Mother    Stomach cancer Father        d.>50   Breast cancer Maternal Aunt        dx >50   Breast cancer Maternal Aunt        dx 41s   Breast cancer Maternal Aunt    Cancer Maternal Uncle        unk type   Cancer Maternal Uncle        unk type   Breast cancer Cousin        dx 26s   Breast cancer Cousin        dx 30s, does not want genetic testing    Social History   Socioeconomic History   Marital status: Divorced    Spouse name: Not on file   Number of children: 2   Years of education: Not on file   Highest education level: GED or equivalent   Occupational History   Occupation: Disability  Tobacco Use   Smoking status: Never   Smokeless tobacco: Never  Vaping Use   Vaping status: Never Used  Substance and Sexual Activity   Alcohol use: No    Alcohol/week: 0.0 standard drinks of alcohol   Drug use: No   Sexual activity: Not Currently    Partners: Male  Other Topics Concern   Not on file  Social History Narrative   Pt lives alone   Social Drivers of Health   Financial Resource Strain: Low Risk  (04/18/2024)   Overall Financial Resource Strain (CARDIA)    Difficulty of Paying  Living Expenses: Not hard at all  Food Insecurity: No Food Insecurity (04/18/2024)   Hunger Vital Sign    Worried About Running Out of Food in the Last Year: Never true    Ran Out of Food in the Last Year: Never true  Transportation Needs: Unmet Transportation Needs (04/18/2024)   PRAPARE - Transportation    Lack of Transportation (Medical): Yes    Lack of Transportation (Non-Medical): Yes  Physical Activity: Insufficiently Active (04/18/2024)   Exercise Vital Sign    Days of Exercise per Week: 4 days    Minutes of Exercise per Session: 30 min  Stress: Stress Concern Present (04/18/2024)   [employer] of Occupational Health - Occupational Stress Questionnaire    Feeling of Stress: To some extent  Social Connections: Moderately Integrated (04/18/2024)   Social Connection and Isolation Panel    Frequency of Communication with Friends and Family: More than three times a week    Frequency of Social Gatherings with Friends and Family: Once a week    Attends Religious Services: More than 4 times per year    Active Member of [employer] or Organizations: Yes    Attends Banker Meetings: More than 4 times per year    Marital Status: Divorced  Intimate Partner Violence: Not At Risk (04/18/2024)   Humiliation, Afraid, Rape, and Kick questionnaire    Fear of Current or Ex-Partner: No    Emotionally Abused: No    Physically Abused: No     Sexually Abused: No     Current Outpatient Medications:    albuterol  (VENTOLIN  HFA) 108 (90 Base) MCG/ACT inhaler, SMARTSIG:2 Puff(s) By Mouth Every 6 Hours PRN, Disp: , Rfl:    amLODipine  (NORVASC ) 2.5 MG tablet, TAKE 3 TABLETS BY MOUTH DAILY, Disp: 90 tablet, Rfl: 0   aspirin  EC 81 MG tablet, Take 1 tablet (81 mg total) by mouth daily., Disp: 30 tablet, Rfl: 0   atenolol  (TENORMIN ) 25 MG tablet, Take 1 tablet by mouth in the morning and at bedtime., Disp: , Rfl:    atorvastatin  (LIPITOR) 40 MG tablet, TAKE 1 TABLET BY MOUTH DAILY, Disp: 100 tablet, Rfl: 2   Biotin  1 MG CAPS, Take 1 capsule by mouth daily., Disp: 30 capsule, Rfl: 0   buPROPion  (WELLBUTRIN  XL) 300 MG 24 hr tablet, Take 300 mg by mouth in the morning., Disp: , Rfl:    Cholecalciferol (VITAMIN D ) 2000 UNITS CAPS, Take 1 capsule by mouth daily., Disp: , Rfl:    clobetasol ointment (TEMOVATE) 0.05 %, Apply 1 application topically 2 (two) times daily., Disp: , Rfl:    clonazePAM  (KLONOPIN ) 0.5 MG tablet, Take 0.25 mg by mouth at bedtime., Disp: , Rfl:    cyclobenzaprine  (FLEXERIL ) 5 MG tablet, Take by mouth., Disp: , Rfl:    diclofenac  Sodium (VOLTAREN ) 1 % GEL, Apply topically daily as needed., Disp: , Rfl:    famotidine (PEPCID) 20 MG tablet, Take 20 mg by mouth daily., Disp: , Rfl:    fluticasone  (FLONASE ) 50 MCG/ACT nasal spray, Place 2 sprays into both nostrils daily., Disp: 48 g, Rfl: 3   hydrALAZINE  (APRESOLINE ) 10 MG tablet, TAKE 1 TABLET BY MOUTH TWICE A DAY AS NEEDED, Disp: 180 tablet, Rfl: 1   hydroxychloroquine (PLAQUENIL) 200 MG tablet, Take by mouth 2 (two) times daily., Disp: , Rfl:    hydrOXYzine  (ATARAX ) 10 MG tablet, Take 1 tablet (10 mg total) by mouth 3 (three) times daily as needed., Disp: 30 tablet, Rfl: 0   lamoTRIgine  (  LAMICTAL ) 200 MG tablet, Take 400 mg by mouth at bedtime., Disp: , Rfl:    lidocaine  (XYLOCAINE ) 5 % ointment, Apply 1 Application topically 4 (four) times daily as needed., Disp: , Rfl:     LINZESS  145 MCG CAPS capsule, Take 145 mcg by mouth daily., Disp: , Rfl:    loratadine  (CLARITIN ) 10 MG tablet, Take 1 tablet (10 mg total) by mouth daily., Disp: 90 tablet, Rfl: 1   Melatonin 10 MG TABS, Take 1 tablet by mouth at bedtime., Disp: , Rfl:    metFORMIN  (GLUCOPHAGE -XR) 500 MG 24 hr tablet, TAKE 1 TABLET BY MOUTH DAILY  WITH BREAKFAST, Disp: 100 tablet, Rfl: 2   mycophenolate  (MYFORTIC) 360 MG TBEC EC tablet, Take 720 mg by mouth 2 (two) times daily., Disp: , Rfl:    olopatadine  (PATANOL) 0.1 % ophthalmic solution, Place 1 drop into both eyes 2 (two) times daily., Disp: 5 mL, Rfl: 1   omeprazole  (PRILOSEC) 40 MG capsule, Take 1 capsule by mouth in the morning and at bedtime. Pulmonologist, Disp: , Rfl:    ondansetron  (ZOFRAN ) 4 MG tablet, Take 1 tablet (4 mg total) by mouth every 8 (eight) hours as needed for nausea or vomiting., Disp: 20 tablet, Rfl: 0   pregabalin (LYRICA) 75 MG capsule, Take 75 mg by mouth 2 (two) times daily., Disp: , Rfl:    Spacer/Aero-Holding Chambers (AEROCHAMBER MV) inhaler, 1 each by Other route., Disp: , Rfl:    telmisartan  (MICARDIS ) 80 MG tablet, TAKE 1 TABLET BY MOUTH DAILY, Disp: 100 tablet, Rfl: 2   traZODone (DESYREL) 100 MG tablet, Take 150 mg by mouth at bedtime., Disp: , Rfl:    triamcinolone  ointment (KENALOG ) 0.1 %, , Disp: , Rfl:   Allergies  Allergen Reactions   Ace Inhibitors     Other reaction(s): OTHER Pt states she can not take ace inhibitors. Pt states she can not remember her reaction.    Bee Pollen    Penicillins    Pollen Extract      ROS  Constitutional: Negative for fever or weight change.  Respiratory: positive  for chronic cough and shortness of breath.   Cardiovascular: positive  for chest pain and intermittent  palpitations.  Gastrointestinal: Negative for abdominal pain, no bowel changes.  Musculoskeletal: positive  for gait problem and intermittent  joint swelling.  Skin: Negative for rash.  Neurological: positive  for intermittent dizziness and  headache.  No other specific complaints in a complete review of systems (except as listed in HPI above).   Objective  Vitals:   04/18/24 0911  BP: 122/70  Pulse: 76  Resp: 16  SpO2: 97%  Weight: 188 lb 11.2 oz (85.6 kg)  Height: 5' 6.13 (1.68 m)    Body mass index is 30.34 kg/m.  Physical Exam  Constitutional: Patient appears well-developed and well-nourished. No distress.  HENT: Head: Normocephalic and atraumatic. Ears: B TMs ok, no erythema or effusion; Nose: Nose normal. Mouth/Throat: Oropharynx is clear and moist. No oropharyngeal exudate.  Eyes: Conjunctivae and EOM are normal. Pupils are equal, round, and reactive to light. No scleral icterus.  Neck: Normal range of motion. Neck supple. No JVD present. No thyromegaly present.  Cardiovascular: Normal rate, regular rhythm and normal heart sounds.  No murmur heard. No BLE edema. Pulmonary/Chest: Effort normal and breath sounds normal. No respiratory distress. Abdominal: Soft. Bowel sounds are normal, no distension. There is no tenderness. no masses Breast: no lumps or masses, no nipple discharge or rashes FEMALE GENITALIA:  External genitalia normal External urethra normal Vaginal atrophy present  Cervix absent  Bimanual exam normal without masses RECTAL: not done  Musculoskeletal: scar from previous right knee replacement surgery  Neurological: he is alert and oriented to person, place, and time. No cranial nerve deficit. Coordination, balance, strength, speech and gait are normal.  Skin: Skin is warm and dry. No rash noted. No erythema.  Psychiatric: Patient has a normal mood and affect. behavior is normal. Judgment and thought content normal.     Assessment & Plan  1. Well adult exam (Primary)   2. Osteoporosis screening  - DG Bone Density; Future  3. Screening mammogram for breast cancer  - MM 3D SCREENING MAMMOGRAM BILATERAL BREAST; Future   -USPSTF grade A and B  recommendations reviewed with patient; age-appropriate recommendations, preventive care, screening tests, etc discussed and encouraged; healthy living encouraged; see AVS for patient education given to patient -Discussed importance of 150 minutes of physical activity weekly, eat two servings of fish weekly, eat one serving of tree nuts ( cashews, pistachios, pecans, almonds.SABRA) every other day, eat 6 servings of fruit/vegetables daily and drink plenty of water and avoid sweet beverages.   -Reviewed Health Maintenance: Yes.

## 2024-04-18 NOTE — Unmapped (Signed)
 [facility] Refill Coordination Note    Caitlin Smith, DOB: April 21, 1956  Phone: [phone] (home) [phone] (work)      All above HIPAA information was verified with patient.         04/17/2024     2:07 PM   Specialty Rx Medication Refill Questionnaire   Which Medications would you like refilled and shipped? Mycophenolate  720mg    If medication refills are not needed at this time, please indicate the reason below. N/A   Please list all current allergies: Pollen hay fever   Have you missed any doses in the last 30 days? No   Have you had any changes to your medication(s) since your last refill? No   How much of each medication do you have remaining at home? (eg. number of tablets, injections, etc.) 10 days   Have you experienced any side effects in the last 30 days? No   Please enter the full address (street address, city, state, zip code) where you would like your medication(s) to be delivered to. Caitlin Smith [address] [place]   Please specify on which day you would like your medication(s) to arrive. Note: if you need your medication(s) within 3 days, please call the pharmacy to schedule your order at [phone]  04/26/2024   Has your insurance changed since your last refill? Yes   If YES, please enter your new insurance information (include BIN, PCN, RX Group, and Member ID). Hay fever Pollen   Would you like a pharmacist to call you to discuss your medication(s)? No   Do you require a signature for your package? (Note: if we are billing Medicare Part B or your order contains a controlled substance, we will require a signature) No   I have been provided my out of pocket cost for my medication and approve the pharmacy to charge the amount to my credit card on file. Yes   Additional Comments: Thanks for the reminder         Completed refill call assessment today to schedule patient's medication shipment from the [facility] [phone]).  All relevant notes have been reviewed.       Confirmed patient received a Conservation officer, historic buildings and a Surveyor, mining with first shipment. The patient will receive a drug information handout for each medication shipped and additional FDA Medication Guides as required.         REFERRAL TO PHARMACIST     Referral to the pharmacist: Not needed      [facility]     Shipping address confirmed in Epic.     Delivery Scheduled: Yes, Expected medication delivery date: 8.22.25.     Medication will be delivered via Next Day Courier to the prescription address in Epic WAM.    Caitlin Smith   [facility] Specialty Technician

## 2024-04-25 ENCOUNTER — Other Ambulatory Visit: Payer: Self-pay | Admitting: Family Medicine

## 2024-04-25 DIAGNOSIS — I10 Essential (primary) hypertension: Secondary | ICD-10-CM

## 2024-04-25 DIAGNOSIS — M797 Fibromyalgia: Principal | ICD-10-CM

## 2024-04-25 DIAGNOSIS — F411 Generalized anxiety disorder: Principal | ICD-10-CM

## 2024-04-25 DIAGNOSIS — K219 Gastro-esophageal reflux disease without esophagitis: Principal | ICD-10-CM

## 2024-04-25 DIAGNOSIS — G894 Chronic pain syndrome: Principal | ICD-10-CM

## 2024-04-25 DIAGNOSIS — F32A Depressive disorder: Principal | ICD-10-CM

## 2024-04-25 MED ORDER — PREGABALIN 75 MG CAPSULE
ORAL_CAPSULE | Freq: Two times a day (BID) | ORAL | 0 refills | 60.00000 days | Status: CP
Start: 2024-04-25 — End: ?

## 2024-04-25 MED ORDER — OMEPRAZOLE 40 MG CAPSULE,DELAYED RELEASE
ORAL_CAPSULE | Freq: Two times a day (BID) | ORAL | 3 refills | 0.00000 days
Start: 2024-04-25 — End: ?

## 2024-04-25 MED ORDER — LAMOTRIGINE 200 MG TABLET
ORAL_TABLET | Freq: Every evening | ORAL | 0 refills | 90.00000 days
Start: 2024-04-25 — End: ?

## 2024-04-25 MED FILL — MYCOPHENOLATE SODIUM 360 MG TABLET,DELAYED RELEASE: ORAL | 30 days supply | Qty: 120 | Fill #8

## 2024-04-25 NOTE — Unmapped (Addendum)
 It was great to meet you in clinic today! The following medication changes were discussed today:     -- Decrease Lamictal  to 200 mg once at bedtime    -- You may try to take one-fourth of the Klonopin  tablet so that we can work towards discontinuing the medication.     -- Please continue all other medications as prescribed     Follow-up instructions:  - Please continue taking your medications as prescribed for your mental health.   - Do not make changes to your medications, including taking more or less than prescribed, unless under the supervision of your physician. Be aware that some medications may make you feel worse if abruptly stopped  - Please refrain from using illicit substances, as these can affect your mood and could cause anxiety or other concerning symptoms.   - Seek further medical care for any increase in symptoms or new symptoms such as thoughts of wanting to hurt yourself or hurt others.     Contact info:  Life-threatening emergencies: Call 911, the 988 suicide and crisis lifeline, or go to the nearest ER for medical or psychiatric attention.           Issues that need urgent attention but are not life threatening: Call the clinic outpatient front desk for assistance:    - [phone] for [facility] clinics located at [address] [facility]  - [phone] for [facility] child and adolescent psychiatry clinics located at [address]  - [phone] for [facility] and adult psychiatry clinics located at [address] [facility]    Non-urgent routine concerns and questions: Send a message through [facility] or call our clinic front desk.    Refill requests: Check with your pharmacy to initiate refill requests.    Regarding appointments:  - If you need to cancel your appointment, we ask that you call your clinic at least 24 hours before your scheduled appointment at the numbers listed above.  - If for any reason you arrive 15 minutes later than your scheduled appointment time, you may not be seen and your visit may be rescheduled.  - Please remember that we will not automatically reschedule missed appointments.  - If you miss two (2) appointments without letting us  know in advance, you will likely be referred to a provider in your community.  - We will do our best to be on time. Sometimes an emergency will arise that might cause your clinician to be late. We will try to inform you of this when you check in for your appointment. If you wait more than 15 minutes past your appointment time without such notice, please speak with the front desk staff.    In the event of bad weather, the clinic staff will attempt to contact you, should your appointment need to be rescheduled. Additionally, you can call the Patient Weather Line [phone] for system-wide clinic status    For more information and reminders regarding clinic policies (these were provided when you were admitted to the clinic), please ask the front desk.

## 2024-04-25 NOTE — Unmapped (Signed)
Will need an appointment for further refills
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
## 2024-04-26 MED ORDER — OMEPRAZOLE 40 MG CAPSULE,DELAYED RELEASE
ORAL_CAPSULE | Freq: Two times a day (BID) | ORAL | 3 refills | 90.00000 days | Status: CP
Start: 2024-04-26 — End: ?

## 2024-04-26 NOTE — Unmapped (Signed)
 Refill request received for patient.      Medication Requested: Omeprazole   Last Office Visit: 01/10/2024   Next Office Visit: Visit date not found  Last Prescriber: Ramm    Nurse refill requirements met? No  If not met, why: Medication not on approved list for [facility] to refill       Sent to: Provider for signing  If sent to provider, which provider?: Ramm

## 2024-04-28 NOTE — Unmapped (Signed)
 This was a video service where a resident was involved. The resident and the patient met via video.  As the attending physician, I spent 5 minutes in medical discussion with the patient via real-time audio and video, participating in the key portions of the service.I spent an additional 15 minutes on pre- and post-visit activities which were specific to the patient and included reviewing the patient???s medical records, lab results, imaging results, and other pertinent records. I reviewed the resident's note and I agree with the resident's findings and plan.    Arthea LITTIE Schneider, MD

## 2024-04-30 ENCOUNTER — Encounter: Payer: Self-pay | Admitting: Family Medicine

## 2024-05-01 ENCOUNTER — Other Ambulatory Visit: Payer: Self-pay

## 2024-05-01 ENCOUNTER — Emergency Department

## 2024-05-01 ENCOUNTER — Emergency Department: Admission: EM | Admit: 2024-05-01 | Discharge: 2024-05-01 | Disposition: A | Discharge status: Home / Self Care

## 2024-05-01 DIAGNOSIS — M329 Systemic lupus erythematosus, unspecified: Secondary | ICD-10-CM | POA: Diagnosis not present

## 2024-05-01 DIAGNOSIS — J45909 Unspecified asthma, uncomplicated: Secondary | ICD-10-CM | POA: Insufficient documentation

## 2024-05-01 DIAGNOSIS — I159 Secondary hypertension, unspecified: Secondary | ICD-10-CM | POA: Diagnosis not present

## 2024-05-01 DIAGNOSIS — R519 Headache, unspecified: Secondary | ICD-10-CM | POA: Diagnosis present

## 2024-05-01 DIAGNOSIS — I10 Essential (primary) hypertension: Secondary | ICD-10-CM | POA: Diagnosis not present

## 2024-05-01 LAB — URINALYSIS, ROUTINE W REFLEX MICROSCOPIC
Bilirubin Urine: NEGATIVE
Glucose, UA: NEGATIVE mg/dL
Hgb urine dipstick: NEGATIVE
Ketones, ur: NEGATIVE mg/dL
Leukocytes,Ua: NEGATIVE
Nitrite: NEGATIVE
Protein, ur: NEGATIVE mg/dL
Specific Gravity, Urine: 1.017 (ref 1.005–1.030)
pH: 5 (ref 5.0–8.0)

## 2024-05-01 LAB — CBC WITH DIFFERENTIAL/PLATELET
Abs Immature Granulocytes: 0.02 K/uL (ref 0.00–0.07)
Basophils Absolute: 0.1 K/uL (ref 0.0–0.1)
Basophils Relative: 2 %
Eosinophils Absolute: 0.1 K/uL (ref 0.0–0.5)
Eosinophils Relative: 3 %
HCT: 42.4 % (ref 36.0–46.0)
Hemoglobin: 13.3 g/dL (ref 12.0–15.0)
Immature Granulocytes: 0 %
Lymphocytes Relative: 46 %
Lymphs Abs: 2.2 K/uL (ref 0.7–4.0)
MCH: 27 pg (ref 26.0–34.0)
MCHC: 31.4 g/dL (ref 30.0–36.0)
MCV: 86.2 fL (ref 80.0–100.0)
Monocytes Absolute: 0.7 K/uL (ref 0.1–1.0)
Monocytes Relative: 14 %
Neutro Abs: 1.7 K/uL (ref 1.7–7.7)
Neutrophils Relative %: 35 %
Platelets: 301 K/uL (ref 150–400)
RBC: 4.92 MIL/uL (ref 3.87–5.11)
RDW: 13.5 % (ref 11.5–15.5)
WBC: 4.8 K/uL (ref 4.0–10.5)
nRBC: 0 % (ref 0.0–0.2)

## 2024-05-01 LAB — RESP PANEL BY RT-PCR (RSV, FLU A&B, COVID)  RVPGX2
Influenza A by PCR: NEGATIVE
Influenza B by PCR: NEGATIVE
Resp Syncytial Virus by PCR: NEGATIVE
SARS Coronavirus 2 by RT PCR: NEGATIVE

## 2024-05-01 LAB — BASIC METABOLIC PANEL WITH GFR
Anion gap: 6 (ref 5–15)
BUN: 16 mg/dL (ref 8–23)
CO2: 26 mmol/L (ref 22–32)
Calcium: 9 mg/dL (ref 8.9–10.3)
Chloride: 105 mmol/L (ref 98–111)
Creatinine, Ser: 1.02 mg/dL — ABNORMAL HIGH (ref 0.44–1.00)
GFR, Estimated: >60 mL/min (ref >60)
Glucose, Bld: 96 mg/dL (ref 70–99)
Potassium: 4 mmol/L (ref 3.5–5.1)
Sodium: 137 mmol/L (ref 135–145)

## 2024-05-01 MED ORDER — HYDRALAZINE HCL 10 MG PO TABS: 10.0000 mg | ORAL_TABLET | Freq: Four times a day (QID) | ORAL | 0 refills | Status: DC | PRN

## 2024-05-01 NOTE — ED Triage Notes (Signed)
 Pt reports over the past few days she has had fluctuations in her BP. Pt denies chest pain but reports headache, denies numbness or weakness in extremities. Speech clear. Pt reports hx HTN, denies recent change in meds or missing any doses.

## 2024-05-01 NOTE — Discharge Instructions (Signed)
 You were seen in the emergency department for concern for elevated blood pressure.  Workup today was reassuring.  I have increased your dose of hydralazine  to 4 times as needed.  Only take this if your systolic blood pressures greater than 185 and your diastolic blood pressures greater than 105.  Please call your primary care physician and make an appointment as soon as possible.  Return with any acutely worsening symptoms or any other emergency. -- RETURN PRECAUTIONS & AFTERCARE: (ENGLISH) RETURN PRECAUTIONS: Return immediately to the emergency department or see/call your doctor if you feel worse, weak or have changes in speech or vision, are short of breath, have fever, vomiting, pain, bleeding or dark stool, trouble urinating or any new issues. Return here or see/call your doctor if not improving as expected for your suspected condition. FOLLOW-UP CARE: Call your doctor and/or any doctors we referred you to for more advice and to make an appointment. Do this today, tomorrow or after the weekend. Some doctors only take PPO insurance so if you have HMO insurance you may want to contact your HMO or your regular doctor for referral to a specialist within your plan. Either way tell the doctor's office that it was a referral from the emergency department so you get the soonest possible appointment.  YOUR TEST RESULTS: Take result reports of any blood or urine tests, imaging tests and EKG's to your doctor and any referral doctor. Have any abnormal tests repeated. Your doctor or a referral doctor can let you know when this should be done. Also make sure your doctor contacts this hospital to get any test results that are not currently available such as cultures or special tests for infection and final imaging reports, which are often not available at the time you leave the ER but which may list additional important findings that are not documented on the preliminary report. BLOOD PRESSURE: If your blood pressure was  greater than 120/80 have your blood pressure rechecked within 1 to 2 weeks. MEDICATION SIDE EFFECTS: Do not drive, walk, bike, take the bus, etc. if you have received or are being prescribed any sedating medications such as those for pain or anxiety or certain antihistamines like Benadryl . If you have been give one of these here get a taxi home or have a friend drive you home. Ask your pharmacist to counsel you on potential side effects of any new medication

## 2024-05-02 ENCOUNTER — Other Ambulatory Visit: Payer: Self-pay | Admitting: Family Medicine

## 2024-05-02 DIAGNOSIS — N644 Mastodynia: Secondary | ICD-10-CM

## 2024-05-02 NOTE — ED Provider Notes (Signed)
 [facility] Provider Note    Event Date/Time   First MD Initiated Contact with Patient 05/01/24 1948     (approximate)   History   Hypertension   HPI  Nicole Ballard is a 68 y.o. female with a past medical history of hypertension, lupus, asthma, who presents to the emergency department with concern for intermittent headache and intermittent elevations of her blood pressure.  Patient states that despite taking her blood pressure medication which is amlodipine , her blood pressure has been elevated at home and to as much is systolic 160s with diastolic 100s.  She reports a headache earlier today however it has dissipated.  Denies any hearing or vision changes, chest pain shortness of breath or changes in urinary or bowel habits.      Physical Exam   Triage Vital Signs: ED Triage Vitals  Encounter Vitals Group     BP 05/01/24 1931 (!) 154/74     Girls Systolic BP Percentile --      Girls Diastolic BP Percentile --      Boys Systolic BP Percentile --      Boys Diastolic BP Percentile --      Pulse Rate 05/01/24 1931 68     Resp 05/01/24 1931 18     Temp 05/01/24 1931 98.4 F (36.9 C)     Temp Source 05/01/24 1931 Oral     SpO2 05/01/24 1931 98 %     Weight 05/01/24 1931 188 lb (85.3 kg)     Height 05/01/24 1931 5' 7 (1.702 m)     Head Circumference --      Peak Flow --      Pain Score 05/01/24 1934 0     Pain Loc --      Pain Education --      Exclude from Growth Chart --     Most recent vital signs: Vitals:   05/01/24 2210 05/01/24 2213  BP:    Pulse: (!) 58   Resp: 16   Temp:  98.1 F (36.7 C)  SpO2: 100%     Nursing Triage Note reviewed. Vital signs reviewed and patients oxygen saturation is normoxic  General: Patient is well nourished, well developed, awake and alert, resting comfortably in no acute distress Head: Normocephalic and atraumatic Eyes: Normal inspection, extraocular muscles intact, no conjunctival pallor Ear,  nose, throat: Normal external exam Neck: Normal range of motion Respiratory: Patient is in no respiratory distress, lungs CTAB Cardiovascular: Patient is not tachycardic, RRR without murmur appreciated GI: Abd SNT with no guarding or rebound  Back: Normal inspection of the back with good strength and range of motion throughout all ext Extremities: pulses intact with good cap refills, no LE pitting edema or calf tenderness Neuro: The patient is alert and oriented to person, place, and time, appropriately conversive, with 5/5 bilat UE/LE strength, no gross motor or sensory defects noted. Coordination appears to be adequate.  Ambulates without ataxia Skin: Warm, dry, and intact Psych: normal mood and affect, no SI or HI  ED Results / Procedures / Treatments   Labs (all labs ordered are listed, but only abnormal results are displayed) Labs Reviewed  BASIC METABOLIC PANEL WITH GFR - Abnormal; Notable for the following components:      Result Value   Creatinine, Ser 1.02 (*)    All other components within normal limits  URINALYSIS, ROUTINE W REFLEX MICROSCOPIC - Abnormal; Notable for the following components:   Color, Urine YELLOW (*)  APPearance CLEAR (*)    All other components within normal limits  RESP PANEL BY RT-PCR (RSV, FLU A&B, COVID)  [id number]  CBC WITH DIFFERENTIAL/PLATELET     EKG EKG and rhythm strip are interpreted by myself:   EKG: [Normal sinus rhythm] at heart rate of 54, normal QRS duration, QTc 386, nonspecific ST segments and T waves no ectopy EKG not consistent with Acute STEMI Rhythm strip: NSR in lead II   RADIOLOGY CT head: No intracranial hemorrhage on my independent review interpretation radiologist agrees    PROCEDURES:  Critical Care performed: No  Procedures   MEDICATIONS ORDERED IN ED: Medications - No data to display   IMPRESSION / MDM / ASSESSMENT AND PLAN / ED COURSE                                Differential diagnosis includes,  but is not limited to, essential hypertension, acute renal insufficiency, intracranial hemorrhage, arrhythmia, anemia   ED course: Patient is well-appearing and without any focal neurological deficits.  Given her atypical headache CT head was obtained which demonstrated no intracranial hemorrhage.  EKG demonstrated no acute ischemia and she had no anemia, acute renal insufficiency or protein in urinalysis.  A COVID swab was obtained as patient states that she had some cough and congestion earlier this week but this was unremarkable.  Given patient's bradycardia I only felt comfortable with increasing her incident of hydralazine .  She was encouraged to follow-up with her primary care physician   Clinical Course as of 05/02/24 0023  Wed May 01, 2024  2103 Creatinine(!): 1.02 At patient's baseline [HD]  2103 CT Head Wo Contrast No acute abnormality [HD]  2103 CBC with Differential No anemia [HD]  2140 CT Head Wo Contrast Workup is reassuring [HD]  2140 Urinalysis, Routine w reflex microscopic -Urine, Clean Catch(!) [HD]    Clinical Course User Index [HD] Nicholaus Rolland BRAVO, MD   At time of discharge there is no evidence of acute life, limb, vision, or fertility threat. Patient has stable vital signs, pain is well controlled, patient is ambulatory and p.o. tolerant.  Discharge instructions were completed using the [employer] system. I would refer you to those at this time. All warnings prescriptions follow-up etc. were discussed in detail with the patient. Patient indicates understanding and is agreeable with this plan. All questions answered.  Patient is made aware that they may return to the emergency department for any worsening or new condition or for any other emergency. -- Risk: 5 This patient has a high risk of morbidity due to further diagnostic testing or treatment. Rationale: This patient's evaluation and management involve a high risk of morbidity due to the potential severity of  presenting symptoms, need for diagnostic testing, and/or initiation of treatment that may require close monitoring. The differential includes conditions with potential for significant deterioration or requiring escalation of care. Treatment decisions in the ED, including medication administration, procedural interventions, or disposition planning, reflect this level of risk. Additional Support: -- Drug therapy requiring intensive monitoring for toxicity [ ]  -- Decision regarding elective major surgery with idenitified patient or procedure risk factors [ ]  -- Decision regarding hospitalization or escalation of hospital-level care [ ]  -- Decision not to resuscitate or to de-escalate care because of poor prognosis [ ]  -- Parental controlled substances [ ]   COPA: 5 The patient has a severe exacerbation, progression, or side effect of treatment of the following  illness/illnesses: []  OR  The patient has the following acute or chronic illness/injury that poses a possible threat to life or bodily function: [X] : The patient has a potentially serious acute condition or an acute exacerbation of a chronic illness requiring urgent evaluation and management in the Emergency Department. The clinical presentation necessitates immediate consideration of life-threatening or function-threatening diagnoses, even if they are ultimately ruled out.  Data(2/3 categories following were performed): 5 I reviewed or ordered at least three unique tests, external notes, and/or the history required an independent historian as one of the three requirements as following: CBC, BMP, urinalysis AND  I independently interpreted the following test: CT head OR  I discussed the management of the patient with the following external physician or qualified healthcare provider: []     Suggested E/M Coding Level: 5, 99285, This has been selected based on the 2022/05/17 CPT guidelines for E/M codes in the Emergency Department based on 2/3 of  the CoPA, Data, and Risk.   FINAL CLINICAL IMPRESSION(S) / ED DIAGNOSES   Final diagnoses:  Secondary hypertension     Rx / DC Orders   ED Discharge Orders          Ordered    hydrALAZINE  (APRESOLINE ) 10 MG tablet  4 times daily PRN        05/01/24 May 18, 2203             Note:  This document was prepared using Dragon voice recognition software and may include unintentional dictation errors.   Nicholaus Rolland BRAVO, MD 05/02/24 [phone]

## 2024-05-03 ENCOUNTER — Encounter: Payer: Self-pay | Admitting: Family Medicine

## 2024-05-03 ENCOUNTER — Ambulatory Visit (INDEPENDENT_AMBULATORY_CARE_PROVIDER_SITE_OTHER): Admitting: Family Medicine

## 2024-05-03 ENCOUNTER — Ambulatory Visit

## 2024-05-03 VITALS — BP 134/84 | HR 75 | Resp 16 | Ht 67.0 in | Wt 188.1 lb

## 2024-05-03 VITALS — BP 134/84 | Ht 67.0 in | Wt 188.0 lb

## 2024-05-03 DIAGNOSIS — I10 Essential (primary) hypertension: Secondary | ICD-10-CM

## 2024-05-03 DIAGNOSIS — Z0000 Encounter for general adult medical examination without abnormal findings: Secondary | ICD-10-CM

## 2024-05-03 DIAGNOSIS — N644 Mastodynia: Secondary | ICD-10-CM

## 2024-05-03 MED ORDER — HYDRALAZINE HCL 25 MG PO TABS: 25.0000 mg | ORAL_TABLET | Freq: Four times a day (QID) | ORAL | 0 refills | Status: DC | PRN

## 2024-05-03 MED ORDER — AMLODIPINE BESYLATE 2.5 MG PO TABS: 7.5000 mg | ORAL_TABLET | Freq: Every day | ORAL | 0 refills | Status: DC

## 2024-05-03 MED ORDER — AMLODIPINE BESYLATE 10 MG PO TABS: 10.0000 mg | ORAL_TABLET | Freq: Every day | ORAL | 0 refills | Status: DC

## 2024-05-03 NOTE — Progress Notes (Signed)
 Name: Nicole Ballard   MRN: [id number]    DOB: November 21, 1955   Date:05/03/2024       Progress Note  Subjective  Chief Complaint  Chief Complaint  Patient presents with   Hospitalization Follow-up    ER-HTN   Discussed the use of AI scribe software for clinical note transcription with the patient, who gave verbal consent to proceed.  History of Present Illness Nicole Ballard is a 68 year old female with hypertension who presents with elevated blood pressure readings.  Her blood pressure has been elevated for the past week, with readings as high as 168/90 mmHg on Monday at the dentist, 144/90 mmHg on Tuesday, and 155/101 mmHg on Wednesday. At urgent care, her blood pressure was 154/94 mmHg, and although it normalized after treatment, it rose again to 144/90 mmHg the following morning.  She has been taking her prescribed medications, including telmisartan , atenolol , amlodipine , and hydralazine . She reports taking amlodipine , atenolol , and hydralazine , but has been unsure about the correct dosing and has sometimes taken two hydralazine  10 mg tablets when her blood pressure spikes. Towards the end of the visit she stated that she did not know amlodipine  was for HTN and has only been taking 2.5 mg twice daily   Her blood pressure has been fluctuating for about a month, and she has been resting and avoiding activities, including eating, due to concerns about sodium intake. She is uncertain if her diet might be contributing to her blood pressure issues.  She reports breast tenderness for over six months, which was present during a mammogram in February that showed no abnormalities. She has a follow-up mammogram scheduled for January.  No chest pain or palpitations, but she experiences headaches, which she attributes to stress related to her blood pressure.    Patient Active Problem List   Diagnosis Date Noted   Chronic pain of both shoulders 02/15/2024   Balance problem 02/01/2024   Diabetic  polyneuropathy associated with type 2 diabetes mellitus (HCC) 02/01/2024   Lumbar facet arthropathy 10/02/2023   Genetic testing 03/14/2023   Abnormal SPEP 01/13/2023   Weight loss 01/13/2023   Family history of breast cancer 01/13/2023   Asthma, chronic, severe persistent, uncomplicated 10/12/2022   Interstitial lung disease (HCC) 07/12/2022   Empty sella (HCC) 07/12/2022   History of craniotomy 07/12/2022   Immunocompromised (HCC) 04/29/2021   Hyperglycemia 10/23/2017   Vertigo 09/29/2016   Bronchiolitis obliterans organizing pneumonia (HCC) 09/29/2016   Neck pain, musculoskeletal 12/15/2015   Cough in adult 09/11/2015   Abnormal CAT scan 04/11/2015   Auditory impairment 04/11/2015   Insomnia, persistent 04/11/2015   Chronic kidney disease (CKD), stage I 04/11/2015   Chronic nonmalignant pain 04/11/2015   Chronic constipation 04/11/2015   Dyslipidemia 04/11/2015   Fibromyalgia 04/11/2015   Gastro-esophageal reflux disease without esophagitis 04/11/2015   Dysphonia 04/11/2015   Low back pain 04/11/2015   Eczema intertrigo 04/11/2015   Keratosis pilaris 04/11/2015   Chronic recurrent major depressive disorder (HCC) 04/11/2015   Dysmetabolic syndrome 04/11/2015   Neurogenic claudication 04/11/2015   Perennial allergic rhinitis with seasonal variation 04/11/2015   Abnormal presence of protein in urine 04/11/2015   Seborrhea capitis 04/11/2015   Moderate dysplasia of vulva 04/11/2015   Vitamin D  deficiency 04/11/2015   Spinal stenosis of lumbar region 04/11/2015   Treadmill stress test negative for angina pectoris 03/05/2015   Shortness of breath 05/20/2014   H/O total knee replacement 12/31/2013   Episcleritis 09/26/2013   Vocal cord dysfunction 05/28/2013  Anxiety, generalized 03/19/2013   Back pain 12/18/2012   Pulmonary nodules 11/26/2012   Lung nodule, multiple 11/26/2012   Arthritis, degenerative 11/01/2012   H/O aspiration pneumonitis 10/22/2012    Postinflammatory pulmonary fibrosis (HCC) 10/22/2012   Mixed incontinence 05/04/2012   Lung involvement in systemic lupus erythematosus (HCC) 11/05/2010   Chronic nausea 07/01/2008   Benign hypertension 01/25/2005    Past Surgical History:  Procedure Laterality Date   BRAIN SURGERY     CHOLECYSTECTOMY     VAGINAL HYSTERECTOMY     vulvar surgery      Family History  Problem Relation Age of Onset   Diabetes Mother    Hypertension Mother    Breast cancer Mother    Stomach cancer Father        d.>50   Breast cancer Maternal Aunt        dx >50   Breast cancer Maternal Aunt        dx 3s   Breast cancer Maternal Aunt    Cancer Maternal Uncle        unk type   Cancer Maternal Uncle        unk type   Breast cancer Cousin        dx 50s   Breast cancer Cousin        dx 30s, does not want genetic testing    Social History   Tobacco Use   Smoking status: Never   Smokeless tobacco: Never  Substance Use Topics   Alcohol use: No    Alcohol/week: 0.0 standard drinks of alcohol     Current Outpatient Medications:    albuterol  (VENTOLIN  HFA) 108 (90 Base) MCG/ACT inhaler, SMARTSIG:2 Puff(s) By Mouth Every 6 Hours PRN, Disp: , Rfl:    amLODipine  (NORVASC ) 2.5 MG tablet, TAKE 3 TABLETS BY MOUTH DAILY, Disp: 90 tablet, Rfl: 0   aspirin  EC 81 MG tablet, Take 1 tablet (81 mg total) by mouth daily., Disp: 30 tablet, Rfl: 0   atenolol  (TENORMIN ) 25 MG tablet, Take 1 tablet by mouth in the morning and at bedtime., Disp: , Rfl:    atorvastatin  (LIPITOR) 40 MG tablet, TAKE 1 TABLET BY MOUTH DAILY, Disp: 100 tablet, Rfl: 2   Biotin  1 MG CAPS, Take 1 capsule by mouth daily., Disp: 30 capsule, Rfl: 0   buPROPion  (WELLBUTRIN  XL) 300 MG 24 hr tablet, Take 300 mg by mouth in the morning., Disp: , Rfl:    Cholecalciferol (VITAMIN D ) 2000 UNITS CAPS, Take 1 capsule by mouth daily., Disp: , Rfl:    clobetasol ointment (TEMOVATE) 0.05 %, Apply 1 application topically 2 (two) times daily., Disp: ,  Rfl:    clonazePAM  (KLONOPIN ) 0.5 MG tablet, Take 0.25 mg by mouth at bedtime., Disp: , Rfl:    cyclobenzaprine  (FLEXERIL ) 5 MG tablet, Take by mouth., Disp: , Rfl:    diclofenac  Sodium (VOLTAREN ) 1 % GEL, Apply topically daily as needed., Disp: , Rfl:    famotidine (PEPCID) 20 MG tablet, Take 20 mg by mouth daily., Disp: , Rfl:    fluticasone  (FLONASE ) 50 MCG/ACT nasal spray, Place 2 sprays into both nostrils daily., Disp: 48 g, Rfl: 3   hydrALAZINE  (APRESOLINE ) 10 MG tablet, Take 1 tablet (10 mg total) by mouth 4 (four) times daily as needed (For sBP>185 and dBP>105)., Disp: 60 tablet, Rfl: 0   hydroxychloroquine (PLAQUENIL) 200 MG tablet, Take by mouth 2 (two) times daily., Disp: , Rfl:    hydrOXYzine  (ATARAX ) 10 MG tablet, Take 1  tablet (10 mg total) by mouth 3 (three) times daily as needed., Disp: 30 tablet, Rfl: 0   lamoTRIgine  (LAMICTAL ) 200 MG tablet, Take 200 mg by mouth at bedtime., Disp: , Rfl:    lidocaine  (XYLOCAINE ) 5 % ointment, Apply 1 Application topically 4 (four) times daily as needed., Disp: , Rfl:    LINZESS  145 MCG CAPS capsule, Take 145 mcg by mouth daily., Disp: , Rfl:    loratadine  (CLARITIN ) 10 MG tablet, Take 1 tablet (10 mg total) by mouth daily., Disp: 90 tablet, Rfl: 1   Melatonin 10 MG TABS, Take 1 tablet by mouth at bedtime., Disp: , Rfl:    metFORMIN  (GLUCOPHAGE -XR) 500 MG 24 hr tablet, TAKE 1 TABLET BY MOUTH DAILY  WITH BREAKFAST, Disp: 100 tablet, Rfl: 2   mycophenolate  (MYFORTIC) 360 MG TBEC EC tablet, Take 720 mg by mouth 2 (two) times daily., Disp: , Rfl:    olopatadine  (PATANOL) 0.1 % ophthalmic solution, Place 1 drop into both eyes 2 (two) times daily., Disp: 5 mL, Rfl: 1   omeprazole  (PRILOSEC) 40 MG capsule, Take 1 capsule by mouth in the morning and at bedtime. Pulmonologist, Disp: , Rfl:    ondansetron  (ZOFRAN ) 4 MG tablet, Take 1 tablet (4 mg total) by mouth every 8 (eight) hours as needed for nausea or vomiting., Disp: 20 tablet, Rfl: 0   pregabalin  (LYRICA) 75 MG capsule, Take 75 mg by mouth 2 (two) times daily., Disp: , Rfl:    QVAR REDIHALER 80 MCG/ACT inhaler, Inhale 2 puffs into the lungs., Disp: , Rfl:    Spacer/Aero-Holding Chambers (AEROCHAMBER MV) inhaler, 1 each by Other route., Disp: , Rfl:    telmisartan  (MICARDIS ) 80 MG tablet, TAKE 1 TABLET BY MOUTH DAILY, Disp: 100 tablet, Rfl: 2   traZODone (DESYREL) 100 MG tablet, Take 150 mg by mouth at bedtime., Disp: , Rfl:    triamcinolone  ointment (KENALOG ) 0.1 %, , Disp: , Rfl:   Allergies  Allergen Reactions   Ace Inhibitors     Other reaction(s): OTHER Pt states she can not take ace inhibitors. Pt states she can not remember her reaction.    Bee Pollen    Penicillins    Pollen Extract     I personally reviewed active problem list, medication list, allergies, family history with the patient/caregiver today.   ROS  Ten systems reviewed and is negative except as mentioned in HPI    Objective Physical Exam CONSTITUTIONAL: Patient appears well-developed and well-nourished. No distress. HEENT: Head atraumatic, normocephalic, neck supple. CARDIOVASCULAR: Normal rate, regular rhythm and normal heart sounds. No murmur heard. No BLE edema. PULMONARY: Effort normal and breath sounds normal. No respiratory distress. ABDOMINAL: There is no tenderness or distention. MUSCULOSKELETAL: Normal gait. Without gross motor or sensory deficit. PSYCHIATRIC: Patient has a normal mood and affect. Behavior is normal. Judgment and thought content normal.   Vitals:   05/03/24 0949  BP: 134/84  Pulse: 75  Resp: 16  SpO2: 97%  Weight: 188 lb 1.6 oz (85.3 kg)  Height: 5' 7 (1.702 m)    Body mass index is 29.46 kg/m.  Recent Results (from the past 2160 hours)  CBC with Differential     Status: None   Collection Time: 05/01/24  7:36 PM  Result Value Ref Range   WBC 4.8 4.0 - 10.5 K/uL   RBC 4.92 3.87 - 5.11 MIL/uL   Hemoglobin 13.3 12.0 - 15.0 g/dL   HCT 57.5 63.9 - 53.9 %   MCV  86.2 80.0 - 100.0 fL   MCH 27.0 26.0 - 34.0 pg   MCHC 31.4 30.0 - 36.0 g/dL   RDW 86.4 88.4 - 84.4 %   Platelets 301 150 - 400 K/uL   nRBC 0.0 0.0 - 0.2 %   Neutrophils Relative % 35 %   Neutro Abs 1.7 1.7 - 7.7 K/uL   Lymphocytes Relative 46 %   Lymphs Abs 2.2 0.7 - 4.0 K/uL   Monocytes Relative 14 %   Monocytes Absolute 0.7 0.1 - 1.0 K/uL   Eosinophils Relative 3 %   Eosinophils Absolute 0.1 0.0 - 0.5 K/uL   Basophils Relative 2 %   Basophils Absolute 0.1 0.0 - 0.1 K/uL   Immature Granulocytes 0 %   Abs Immature Granulocytes 0.02 0.00 - 0.07 K/uL    Comment: Performed at [facility], [address], [place], [place] [address]  Basic metabolic panel     Status: Abnormal   Collection Time: 05/01/24  7:36 PM  Result Value Ref Range   Sodium 137 135 - 145 mmol/L   Potassium 4.0 3.5 - 5.1 mmol/L   Chloride 105 98 - 111 mmol/L   CO2 26 22 - 32 mmol/L   Glucose, Bld 96 70 - 99 mg/dL    Comment: Glucose reference range applies only to samples taken after fasting for at least 8 hours.   BUN 16 8 - 23 mg/dL   Creatinine, Ser 8.97 (H) 0.44 - 1.00 mg/dL   Calcium  9.0 8.9 - 10.3 mg/dL   GFR, Estimated >39 >39 mL/min    Comment: (NOTE) Calculated using the CKD-EPI Creatinine Equation (2021)    Anion gap 6 5 - 15    Comment: Performed at [facility], [address] [place], [place] [address]  Urinalysis, Routine w reflex microscopic -Urine, Clean Catch     Status: Abnormal   Collection Time: 05/01/24  8:50 PM  Result Value Ref Range   Color, Urine YELLOW (A) YELLOW   APPearance CLEAR (A) CLEAR   Specific Gravity, Urine 1.017 1.005 - 1.030   pH 5.0 5.0 - 8.0   Glucose, UA NEGATIVE NEGATIVE mg/dL   Hgb urine dipstick NEGATIVE NEGATIVE   Bilirubin Urine NEGATIVE NEGATIVE   Ketones, ur NEGATIVE NEGATIVE mg/dL   Protein, ur NEGATIVE NEGATIVE mg/dL   Nitrite NEGATIVE NEGATIVE   Leukocytes,Ua NEGATIVE NEGATIVE    Comment: Performed at [facility], [address] [place], [place] [address]  Resp panel by RT-PCR (RSV, Flu A&B, Covid) Anterior Nasal Swab     Status: None   Collection Time: 05/01/24  8:50 PM   Specimen: Anterior Nasal Swab  Result Value Ref Range   SARS Coronavirus 2 by RT PCR NEGATIVE NEGATIVE    Comment: (NOTE) SARS-CoV-2 target nucleic acids are NOT DETECTED.  The SARS-CoV-2 RNA is generally detectable in upper respiratory specimens during the acute phase of infection. The lowest concentration of SARS-CoV-2 viral copies this assay can detect is 138 copies/mL. A negative result does not preclude SARS-Cov-2 infection and should not be used as the sole basis for treatment or other patient management decisions. A negative result may occur with  improper specimen collection/handling, submission of specimen other than nasopharyngeal swab, presence of viral mutation(s) within the areas targeted by this assay, and inadequate number of viral copies(<138 copies/mL). A negative result must be combined with clinical observations, patient history, and epidemiological information. The expected result is Negative.  Fact Sheet for Patients:  BloggerCourse.com  Fact Sheet for Healthcare Providers:  SeriousBroker.it  This test is no t yet approved or cleared by the [place]  FDA and  has been authorized for detection and/or diagnosis of SARS-CoV-2 by FDA under an Emergency Use Authorization (EUA). This EUA will remain  in effect (meaning this test can be used) for the duration of the COVID-19 declaration under Section 564(b)(1) of the Act, 21 U.S.C.section 360bbb-3(b)(1), unless the authorization is terminated  or revoked sooner.       Influenza A by PCR NEGATIVE NEGATIVE   Influenza B by PCR NEGATIVE NEGATIVE    Comment: (NOTE) The Xpert Xpress SARS-CoV-2/FLU/RSV plus assay is intended as an aid in the diagnosis of influenza from Nasopharyngeal swab specimens  and should not be used as a sole basis for treatment. Nasal washings and aspirates are unacceptable for Xpert Xpress SARS-CoV-2/FLU/RSV testing.  Fact Sheet for Patients: BloggerCourse.com  Fact Sheet for Healthcare Providers: SeriousBroker.it  This test is not yet approved or cleared by the [place]  FDA and has been authorized for detection and/or diagnosis of SARS-CoV-2 by FDA under an Emergency Use Authorization (EUA). This EUA will remain in effect (meaning this test can be used) for the duration of the COVID-19 declaration under Section 564(b)(1) of the Act, 21 U.S.C. section 360bbb-3(b)(1), unless the authorization is terminated or revoked.     Resp Syncytial Virus by PCR NEGATIVE NEGATIVE    Comment: (NOTE) Fact Sheet for Patients: BloggerCourse.com  Fact Sheet for Healthcare Providers: SeriousBroker.it  This test is not yet approved or cleared by the [place]  FDA and has been authorized for detection and/or diagnosis of SARS-CoV-2 by FDA under an Emergency Use Authorization (EUA). This EUA will remain in effect (meaning this test can be used) for the duration of the COVID-19 declaration under Section 564(b)(1) of the Act, 21 U.S.C. section 360bbb-3(b)(1), unless the authorization is terminated or revoked.  Performed at [facility], [address] [place], [place] [address]     Diabetic Foot Exam:     PHQ2/9:    05/03/2024    9:48 AM 04/18/2024    9:08 AM 01/09/2024    9:14 AM 10/10/2023    3:32 PM 08/09/2023    2:49 PM  Depression screen PHQ 2/9  Decreased Interest 1 1 1  0 2  Down, Depressed, Hopeless 1 1 1  0 2  PHQ - 2 Score 2 2 2  0 4  Altered sleeping 1 1 1  0 1  Tired, decreased energy 1 1 1  0 2  Change in appetite 1 1 1  0 2  Feeling bad or failure about yourself  0 0 0 0 0  Trouble concentrating 1 0 0 0 1  Moving slowly or  fidgety/restless 0 0 0 0 0  Suicidal thoughts 0 0 0 0 0  PHQ-9 Score 6 5 5  0 10  Difficult doing work/chores Somewhat difficult Somewhat difficult Somewhat difficult Not difficult at all Somewhat difficult    phq 9 is positive  Fall Risk:    05/03/2024    9:43 AM 04/18/2024    9:08 AM 01/09/2024    9:10 AM 10/10/2023    3:32 PM 08/09/2023    2:48 PM  Fall Risk   Falls in the past year? 0 0 0 1 1  Number falls in past yr: 0 0 0 1 1  Injury with Fall? 0 0 0 0 1  Risk for fall due to : No Fall Risks No Fall Risks No Fall Risks Impaired balance/gait  History of fall(s)  Follow up Falls evaluation completed Falls evaluation completed Falls prevention discussed;Education provided;Falls evaluation completed Education provided;Falls prevention discussed;Falls evaluation completed Falls prevention discussed;Education provided;Falls evaluation completed    Assessment & Plan Essential hypertension Hypertension fluctuating with recent high readings. Current regimen may be suboptimal due to incorrect amlodipine  dosing. Anxiety may contribute to elevated readings. - Prescribe hydralazine  25 mg as needed for spikes above 150/90 mmHg. - Educated on correct amlodipine  dosing: three 2.5 mg tablets daily as previously prescribed, she has been only taking 2.5 mg twice daily lately  - Advise blood pressure monitoring every other day, reduce frequency once stable. - Schedule follow-up in one month for blood pressure check.  Breast pain Persistent tenderness for over six months with normal previous mammogram. Current exam shows no lumps. Tenderness alone not indicative of cancer. - Order diagnostic mammogram due to persistent tenderness. - Discussed that tenderness is not usually a sign of cancer and mammogram is diagnostic, not screening, which may affect insurance coverage.  Anxiety disorder Anxiety may contribute to elevated blood pressure. Recent events have increased anxiety. - Advise reducing frequency  of blood pressure monitoring to decrease anxiety.

## 2024-05-03 NOTE — Progress Notes (Signed)
 Because this visit was a virtual/telehealth visit,  certain criteria was not obtained, such a blood pressure, CBG if applicable, and timed get up and go. Any medications not marked as taking were not mentioned during the medication reconciliation part of the visit. Any vitals not documented were not able to be obtained due to this being a telehealth visit or patient was unable to self-report a recent blood pressure reading due to a lack of equipment at home via telehealth. Vitals that have been documented are verbally provided by the patient.   This visit was performed by a medical professional under my direct supervision. I was immediately available for consultation/collaboration. I have reviewed and agree with the Annual Wellness Visit documentation.  Subjective:   Nicole Ballard is a 68 y.o. who presents for a Medicare Wellness preventive visit.  As a reminder, Annual Wellness Visits don't include a physical exam, and some assessments may be limited, especially if this visit is performed virtually. We may recommend an in-person follow-up visit with your provider if needed.  Visit Complete: Virtual I connected with  Nicole Ballard on 05/03/24 by a audio enabled telemedicine application and verified that I am speaking with the correct person using two identifiers.  Patient Location: Home  Provider Location: Home Office  I discussed the limitations of evaluation and management by telemedicine. The patient expressed understanding and agreed to proceed.  Vital Signs: Because this visit was a virtual/telehealth visit, some criteria may be missing or patient reported. Any vitals not documented were not able to be obtained and vitals that have been documented are patient reported.  VideoDeclined- This patient declined Librarian, academic. Therefore the visit was completed with audio only.  Persons Participating in Visit: Patient.  AWV Questionnaire: No: Patient  Medicare AWV questionnaire was not completed prior to this visit.  Cardiac Risk Factors include: advanced age (>25men, >90 women);hypertension;dyslipidemia     Objective:    Today's Vitals   05/03/24 1107  BP: 134/84  Weight: 188 lb (85.3 kg)  Height: 5' 7 (1.702 m)   Body mass index is 29.44 kg/m.     05/03/2024   11:07 AM 05/01/2024    7:35 PM 12/25/2023    8:41 AM 07/18/2023   11:52 AM 06/13/2023    2:05 PM 04/07/2023    4:27 AM 01/13/2023   10:37 AM  Advanced Directives  Does Patient Have a Medical Advance Directive? No No No No No No No  Would patient like information on creating a medical advance directive?   No - Patient declined  No - Patient declined      Current Medications (verified) Outpatient Encounter Medications as of 05/03/2024  Medication Sig   albuterol  (VENTOLIN  HFA) 108 (90 Base) MCG/ACT inhaler SMARTSIG:2 Puff(s) By Mouth Every 6 Hours PRN   amLODipine  (NORVASC ) 2.5 MG tablet Take 3 tablets (7.5 mg total) by mouth daily.   aspirin  EC 81 MG tablet Take 1 tablet (81 mg total) by mouth daily.   atenolol  (TENORMIN ) 25 MG tablet Take 1 tablet by mouth in the morning and at bedtime.   atorvastatin  (LIPITOR) 40 MG tablet TAKE 1 TABLET BY MOUTH DAILY   Biotin  1 MG CAPS Take 1 capsule by mouth daily.   buPROPion  (WELLBUTRIN  XL) 300 MG 24 hr tablet Take 300 mg by mouth in the morning.   Cholecalciferol (VITAMIN D ) 2000 UNITS CAPS Take 1 capsule by mouth daily.   clobetasol ointment (TEMOVATE) 0.05 % Apply 1 application topically 2 (  two) times daily.   clonazePAM  (KLONOPIN ) 0.5 MG tablet Take 0.25 mg by mouth at bedtime.   cyclobenzaprine  (FLEXERIL ) 5 MG tablet Take by mouth.   diclofenac  Sodium (VOLTAREN ) 1 % GEL Apply topically daily as needed.   famotidine (PEPCID) 20 MG tablet Take 20 mg by mouth daily.   fluticasone  (FLONASE ) 50 MCG/ACT nasal spray Place 2 sprays into both nostrils daily.   hydrALAZINE  (APRESOLINE ) 25 MG tablet Take 1 tablet (25 mg total) by  mouth 4 (four) times daily as needed (For sBP>150 and dBP>90).   hydroxychloroquine (PLAQUENIL) 200 MG tablet Take by mouth 2 (two) times daily.   hydrOXYzine  (ATARAX ) 10 MG tablet Take 1 tablet (10 mg total) by mouth 3 (three) times daily as needed.   lamoTRIgine  (LAMICTAL ) 200 MG tablet Take 200 mg by mouth at bedtime.   lidocaine  (XYLOCAINE ) 5 % ointment Apply 1 Application topically 4 (four) times daily as needed.   LINZESS  145 MCG CAPS capsule Take 145 mcg by mouth daily.   loratadine  (CLARITIN ) 10 MG tablet Take 1 tablet (10 mg total) by mouth daily.   Melatonin 10 MG TABS Take 1 tablet by mouth at bedtime.   metFORMIN  (GLUCOPHAGE -XR) 500 MG 24 hr tablet TAKE 1 TABLET BY MOUTH DAILY  WITH BREAKFAST   mycophenolate  (MYFORTIC) 360 MG TBEC EC tablet Take 720 mg by mouth 2 (two) times daily.   olopatadine  (PATANOL) 0.1 % ophthalmic solution Place 1 drop into both eyes 2 (two) times daily.   omeprazole  (PRILOSEC) 40 MG capsule Take 1 capsule by mouth in the morning and at bedtime. Pulmonologist   ondansetron  (ZOFRAN ) 4 MG tablet Take 1 tablet (4 mg total) by mouth every 8 (eight) hours as needed for nausea or vomiting.   pregabalin (LYRICA) 75 MG capsule Take 75 mg by mouth 2 (two) times daily.   QVAR REDIHALER 80 MCG/ACT inhaler Inhale 2 puffs into the lungs.   Spacer/Aero-Holding Chambers (AEROCHAMBER MV) inhaler 1 each by Other route.   telmisartan  (MICARDIS ) 80 MG tablet TAKE 1 TABLET BY MOUTH DAILY   traZODone (DESYREL) 100 MG tablet Take 150 mg by mouth at bedtime.   triamcinolone  ointment (KENALOG ) 0.1 %    No facility-administered encounter medications on file as of 05/03/2024.    Allergies (verified) Ace inhibitors, Bee pollen, Penicillins, and Pollen extract   History: Past Medical History:  Diagnosis Date   Anxiety    Bipolar 1 disorder (HCC)    Chronic kidney disease    Chronic pain    Diabetes mellitus without complication (HCC)    Fibromyalgia    GERD  (gastroesophageal reflux disease)    Hearing loss of right ear    Hemifacial spasm    Hyperlipidemia    Hypertension    Insomnia    Lumbago    Lung disease    Osteoarthritis    Paresthesia    Rhinitis    Systemic lupus erythematosus (HCC)    Vitamin D  deficiency    Past Surgical History:  Procedure Laterality Date   BRAIN SURGERY     CHOLECYSTECTOMY     VAGINAL HYSTERECTOMY     vulvar surgery     Family History  Problem Relation Age of Onset   Diabetes Mother    Hypertension Mother    Breast cancer Mother    Stomach cancer Father        d.>50   Breast cancer Maternal Aunt        dx >50   Breast cancer  Maternal Aunt        dx 21s   Breast cancer Maternal Aunt    Cancer Maternal Uncle        unk type   Cancer Maternal Uncle        unk type   Breast cancer Cousin        dx 39s   Breast cancer Cousin        dx 30s, does not want genetic testing   Social History   Socioeconomic History   Marital status: Divorced    Spouse name: Not on file   Number of children: 2   Years of education: Not on file   Highest education level: GED or equivalent  Occupational History   Occupation: Disability  Tobacco Use   Smoking status: Never   Smokeless tobacco: Never  Vaping Use   Vaping status: Never Used  Substance and Sexual Activity   Alcohol use: No    Alcohol/week: 0.0 standard drinks of alcohol   Drug use: No   Sexual activity: Not Currently    Partners: Male  Other Topics Concern   Not on file  Social History Narrative   Pt lives alone   Social Drivers of Health   Financial Resource Strain: Low Risk  (05/03/2024)   Overall Financial Resource Strain (CARDIA)    Difficulty of Paying Living Expenses: Not hard at all  Food Insecurity: No Food Insecurity (05/03/2024)   Hunger Vital Sign    Worried About Running Out of Food in the Last Year: Never true    Ran Out of Food in the Last Year: Never true  Transportation Needs: No Transportation Needs (05/03/2024)    PRAPARE - Administrator, Civil Service (Medical): No    Lack of Transportation (Non-Medical): No  Recent Concern: Transportation Needs - Unmet Transportation Needs (04/18/2024)   PRAPARE - Transportation    Lack of Transportation (Medical): Yes    Lack of Transportation (Non-Medical): Yes  Physical Activity: Insufficiently Active (05/03/2024)   Exercise Vital Sign    Days of Exercise per Week: 4 days    Minutes of Exercise per Session: 30 min  Stress: Stress Concern Present (05/03/2024)   [employer] of Occupational Health - Occupational Stress Questionnaire    Feeling of Stress: To some extent  Social Connections: Moderately Integrated (05/03/2024)   Social Connection and Isolation Panel    Frequency of Communication with Friends and Family: More than three times a week    Frequency of Social Gatherings with Friends and Family: Once a week    Attends Religious Services: More than 4 times per year    Active Member of [employer] or Organizations: Yes    Attends Engineer, structural: More than 4 times per year    Marital Status: Divorced    Tobacco Counseling Counseling given: Not Answered    Clinical Intake:  Pre-visit preparation completed: Yes  Pain : No/denies pain     BMI - recorded: 29.44 Nutritional Status: BMI 25 -29 Overweight Nutritional Risks: None Diabetes: No  Lab Results  Component Value Date   HGBA1C 5.9 (H) 10/24/2022   HGBA1C 6.3 (H) 07/12/2022   HGBA1C 5.5 06/15/2021     How often do you need to have someone help you when you read instructions, pamphlets, or other written materials from your doctor or pharmacy?: 1 - Never  Interpreter Needed?: No  Information entered by :: Loella Hickle,CMA   Activities of Daily Living     05/03/2024  11:11 AM 08/09/2023    2:49 PM  In your present state of health, do you have any difficulty performing the following activities:  Hearing? 1 1  Vision? 0 0  Difficulty concentrating or  making decisions? 0 1  Walking or climbing stairs? 0 1  Dressing or bathing? 0 0  Doing errands, shopping? 0 1  Preparing Food and eating ? Y   Using the Toilet? N   In the past six months, have you accidently leaked urine? Y   Do you have problems with loss of bowel control? N   Managing your Medications? N   Managing your Finances? N   Housekeeping or managing your Housekeeping? N     Patient Care Team: Sowles, Krichna, MD as PCP - General (Family Medicine) Shary Fell, MD as Referring Physician (Rheumatology) Ammon Blunt, MD as Consulting Physician (Cardiology) Katherleen Donnice HERO, MD as Referring Physician (Otolaryngology) Goetzinger, Greig Klinefelter, PhD as Referring Physician (Psychology) Fatima Arley PARAS, MD as Referring Physician (Psychiatry) Carlyon Rollo LABOR, PA (Gastroenterology) Jeanett Bernardino RAMAN, MD (Pulmonary Disease) Babara Call, MD as Consulting Physician (Oncology)  I have updated your Care Teams any recent Medical Services you may have received from other providers in the past year.     Assessment:   This is a routine wellness examination for Nicole Ballard.  Hearing/Vision screen Hearing Screening - Comments:: Patient wears hearing aid in right ear  Vision Screening - Comments:: Patient wears glasses   Goals Addressed             This Visit's Progress    DIET - INCREASE WATER INTAKE   On track    Recommend drinking 6-8 glasses of water per day        Depression Screen     05/03/2024    9:48 AM 04/18/2024    9:08 AM 01/09/2024    9:14 AM 10/10/2023    3:32 PM 08/09/2023    2:49 PM 07/24/2023    1:06 PM 07/04/2023    8:40 AM  PHQ 2/9 Scores  PHQ - 2 Score 2 2 2  0 4 0 2  PHQ- 9 Score 6 5 5  0 10 0 5    Fall Risk     05/03/2024   11:10 AM 05/03/2024    9:43 AM 04/18/2024    9:08 AM 01/09/2024    9:10 AM 10/10/2023    3:32 PM  Fall Risk   Falls in the past year? 0 0 0 0 1  Number falls in past yr: 0 0 0 0 1  Injury with Fall? 0 0 0 0 0  Risk for  fall due to : No Fall Risks No Fall Risks No Fall Risks No Fall Risks Impaired balance/gait  Follow up Falls evaluation completed Falls evaluation completed Falls evaluation completed Falls prevention discussed;Education provided;Falls evaluation completed Education provided;Falls prevention discussed;Falls evaluation completed    MEDICARE RISK AT HOME:  Medicare Risk at Home Any stairs in or around the home?: Yes If so, are there any without handrails?: No Home free of loose throw rugs in walkways, pet beds, electrical cords, etc?: Yes Adequate lighting in your home to reduce risk of falls?: Yes Life alert?: No Use of a cane, walker or w/c?: No Grab bars in the bathroom?: No Shower chair or bench in shower?: No Elevated toilet seat or a handicapped toilet?: No  TIMED UP AND GO:  Was the test performed?  No  Cognitive Function: 6CIT completed  05/03/2024   11:13 AM 08/23/2022   10:41 AM 08/13/2020   10:30 AM 08/09/2019    9:57 AM  6CIT Screen  What Year? 0 points 0 points 0 points 0 points  What month? 0 points 0 points 0 points 0 points  What time? 0 points 0 points 0 points 0 points  Count back from 20 0 points 0 points 0 points 0 points  Months in reverse 0 points 4 points 2 points 2 points  Repeat phrase 0 points 2 points 2 points 0 points  Total Score 0 points 6 points 4 points 2 points    Immunizations Immunization History  Administered Date(s) Administered   Hepatitis A, Adult 08/12/2015, 02/10/2016   Hepatitis B, ADULT 06/26/2007, 08/12/2015, 10/13/2015, 02/10/2016   Influenza Split 06/01/2010, 05/14/2013, 04/19/2014   Influenza, Seasonal, Injecte, Preservative Fre 06/26/2007, 06/02/2011, 12/01/2011, 06/09/2013, 05/18/2023   Influenza,inj,Quad PF,6+ Mos 04/16/2015, 05/26/2016, 05/17/2017, 05/14/2018, 06/12/2019, 06/15/2020, 06/15/2021, 06/06/2022   Influenza-Unspecified 06/05/2020   MMR 06/26/2007   Measles / Rubella 10/05/1987   Moderna Covid-19 Fall  Seasonal Vaccine 32yrs & older 07/14/2022   Moderna SARS-COV2 Booster Vaccination 12/28/2021   Moderna Sars-Covid-2 Vaccination 11/20/2019, 12/23/2019, 08/25/2020, 12/31/2020, 04/29/2021   PNEUMOCOCCAL CONJUGATE-20 09/30/2021   Pfizer(Comirnaty)Fall Seasonal Vaccine 12 years and older 05/18/2023   Pneumococcal Conjugate-13 08/06/2015, 05/26/2016   Pneumococcal Polysaccharide-23 02/22/2010, 08/04/2016   Tdap 06/26/2007, 02/22/2010, 07/14/2021   Zoster Recombinant(Shingrix) 12/28/2021, 03/15/2022    Screening Tests Health Maintenance  Topic Date Due   OPHTHALMOLOGY EXAM  06/17/2020   Diabetic kidney evaluation - Urine ACR  11/06/2020   FOOT EXAM  05/16/2022   HEMOGLOBIN A1C  04/24/2023   INFLUENZA VACCINE  04/05/2024   COVID-19 Vaccine (8 - Moderna risk 2024-25 season) 05/19/2024 (Originally 11/15/2023)   MAMMOGRAM  09/20/2024   Diabetic kidney evaluation - eGFR measurement  05/01/2025   Medicare Annual Wellness (AWV)  05/03/2025   DTaP/Tdap/Td (4 - Td or Tdap) 07/15/2031   Colonoscopy  10/13/2031   Pneumococcal Vaccine: 50+ Years  Completed   DEXA SCAN  Completed   Hepatitis C Screening  Completed   Zoster Vaccines- Shingrix  Completed   HPV VACCINES  Aged Out   Meningococcal B Vaccine  Aged Out   Hepatitis B Vaccines 19-59 Average Risk  Discontinued    Health Maintenance  Health Maintenance Due  Topic Date Due   OPHTHALMOLOGY EXAM  06/17/2020   Diabetic kidney evaluation - Urine ACR  11/06/2020   FOOT EXAM  05/16/2022   HEMOGLOBIN A1C  04/24/2023   INFLUENZA VACCINE  04/05/2024   Health Maintenance Items Addressed:patient declined  Additional Screening:  Vision Screening: Recommended annual ophthalmology exams for early detection of glaucoma and other disorders of the eye. Would you like a referral to an eye doctor? No    Dental Screening: Recommended annual dental exams for proper oral hygiene  Community Resource Referral / Chronic Care Management: CRR required  this visit?  No   CCM required this visit?  No   Plan:    I have personally reviewed and noted the following in the patient's chart:   Medical and social history Use of alcohol, tobacco or illicit drugs  Current medications and supplements including opioid prescriptions. Patient is not currently taking opioid prescriptions. Functional ability and status Nutritional status Physical activity Advanced directives List of other physicians Hospitalizations, surgeries, and ER visits in previous 12 months Vitals Screenings to include cognitive, depression, and falls Referrals and appointments  In addition, I have reviewed and  discussed with patient certain preventive protocols, quality metrics, and best practice recommendations. A written personalized care plan for preventive services as well as general preventive health recommendations were provided to patient.   Lyle MARLA Right, [place]   05/03/2024   After Visit Summary: (MyChart) Due to this being a telephonic visit, the after visit summary with patients personalized plan was offered to patient via MyChart   Notes: nothing is need at this time

## 2024-05-03 NOTE — Patient Instructions (Signed)
 Nicole Ballard , Thank you for taking time out of your busy schedule to complete your Annual Wellness Visit with me. I enjoyed our conversation and look forward to speaking with you again next year. I, as well as your care team,  appreciate your ongoing commitment to your health goals. Please review the following plan we discussed and let me know if I can assist you in the future. Your Game plan/ To Do List    Referrals: If you haven't heard from the office you've been referred to, please reach out to them at the phone provided.   Follow up Visits: We will see or speak with you next year for your Next Medicare AWV with our clinical staff Have you seen your provider in the last 6 months (3 months if uncontrolled diabetes)? No  Clinician Recommendations:  Aim for 30 minutes of exercise or brisk walking, 6-8 glasses of water, and 5 servings of fruits and vegetables each day.       This is a list of the screenings recommended for you:  Health Maintenance  Topic Date Due   Eye exam for diabetics  06/17/2020   Yearly kidney health urinalysis for diabetes  11/06/2020   Complete foot exam   05/16/2022   Hemoglobin A1C  04/24/2023   Flu Shot  04/05/2024   COVID-19 Vaccine (8 - Moderna risk 2024-25 season) 05/19/2024*   Mammogram  09/20/2024   Yearly kidney function blood test for diabetes  05/01/2025   Medicare Annual Wellness Visit  05/03/2025   DTaP/Tdap/Td vaccine (4 - Td or Tdap) 07/15/2031   Colon Cancer Screening  10/13/2031   Pneumococcal Vaccine for age over 53  Completed   DEXA scan (bone density measurement)  Completed   Hepatitis C Screening  Completed   Zoster (Shingles) Vaccine  Completed   HPV Vaccine  Aged Out   Meningitis B Vaccine  Aged Out   Hepatitis B Vaccine  Discontinued  *Topic was postponed. The date shown is not the original due date.    Advanced directives: (Declined) Advance directive discussed with you today. Even though you declined this today, please call our  office should you change your mind, and we can give you the proper paperwork for you to fill out. Advance Care Planning is important because it:  [x]  Makes sure you receive the medical care that is consistent with your values, goals, and preferences  [x]  It provides guidance to your family and loved ones and reduces their decisional burden about whether or not they are making the right decisions based on your wishes.  Follow the link provided in your after visit summary or read over the paperwork we have mailed to you to help you started getting your Advance Directives in place. If you need assistance in completing these, please reach out to us  so that we can help you!  See attachments for Preventive Care and Fall Prevention Tips.

## 2024-05-03 NOTE — Patient Instructions (Addendum)
 Change in therapy Amlodipine  take three of 2.5 mg daily - you can take it all together or one in am and 2 in pm Hydralazine  you have 10 mg dose at home, you can take 2 if bp spikes above 150/90, the new rx will be for 25 mg and you will only take one if needed

## 2024-05-06 NOTE — Unmapped (Signed)
 Otolaryngology Return Voice Visit      History of Present Illness:  CaitlinCaitlin Smith is a 68 y.o.  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      At the end of our previous clinic visit we discussed and recommended clinical course of Respiratory Retraining, Biotene mouthwash, D/C Listerine- switch to salt water gargles      July 2017: I recommended NeilMed rinses and a workup of glossitis (electrolyte and vitamin levels per primary care physician: B12, zinc, etc.,) Her diagnoses included right vocal fold hypomobility/depression anxiety. 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     December 2018: The patient notes that her symptoms have been relatively stable.  She continues to complain of burning tongue.  Following this visit I recommended a thyroid hormone panel and thyroid ultrasound.     August 2019: The patient reports that she continues to clear there throat and cough in a consistent manner.  Thyroid ultrasound was unremarkable w/o nodules. She occasionally (rarely ) awakens from sleep with cough. She has done a few sessions with SLP, but claims to have forgotten the techniques.  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  I additionally offered behavioral intervention for her cough symptom.     April 2021: In the interim the patient is followed up with neurosurgery and has had radiography demonstrating no additional lesions.  The patient did however complain of right-sided head throbbing, and otalgia.  Caitlin Smith notes that she has continued to have balance difficulty, intermittent cough and dysphonia.  Lastly, she reports that her throat has been burning more than in the past though she has not seen her GI physician for some time.  The patient reports that she has had ongoing right otalgia that she is treated with hydrogen peroxide.  She has had her ears flushed by her primary care physician without resolution.  The patient was referred to otology for right otalgia.     June 2022: Patient returns with concerns for weak vocal projection, worsened over the past several weeks. She attributes this to her LPR, but has been taking her daily Prilosec with minimal relief. Otherwise, she has been doing well, without any additional concerns. Given the stability of the patient's physical examination and findings referable to upper airway inflammation, I have a recommended that we proceed with management of her known LPR. Add Famotidine  20 mg BID to supplement Prilosec 40 mg     October 2022: The patient returns for follow-up. She states that her voice continues to be hoarse. The patient endorses seasonal allergies. She states that she follows up with pulmonology and was started on albuterol  inhaler. She endorses coughing symptoms that has been a past ongoing issue. The patient states that she has the sensation of food getting caught in her epigastric region. She endorses abdominal pain, right under the ribcage, for the past 4-5 months and has not seen GI. She describes it as a dull pain that will last for a few minutes before going away. She states that the abdominal pain will occur randomly at rest and has generally not been benefited by reflux medication. She endorses difficulty with bowel movement, which has been an ongoing issue in the past.I reassured the patient that her larynx appears healthy on laryngoscopy today. I recommend NeilMed nasal saline irrigation for general  nasopharyngeal hygiene. I will refer the patient to GI for further evaluation of gastric discomfort and constipation.  I offered a referral to GI for gastric discomfort and constipation, and recommended NeilMed nasal irrigations.     October 2023: The patient coughs consistently  with pain in the right ear.  The cough comes throughout the day without clear inciting behaviors.  She needs water to calm it down.  The patient reports having tried reflux medication, particularly due to the fact that she also reports some burning in the throat, however this was unhelpful for her cough symptom.  The patient also reports achiness in the right ear (the same side as her previous skull base retroauricular approach). We discussed interventions for chronic cough (putative neurogenic cough).  These include neuromodulator medicines (patient currently on gabapentin  and less interested in beginning another chronic medication), as well as interventions such as SLN injection, and intra laryngeal Botox.  We have decided to proceed to trial of SLN injection on the right, given the prevalence of her symptoms on the right (ear, vocal fold hypomobility)     December 2023: Over the interval since her last visit the patient reports that her cough has been persistent slightly of less intensity recently.  She also notes that she has had some unintended weight loss, and has a specific concern that she may have esophageal cancer, having heard and anecdote about another patient (albeit with no specific current upper GI symptoms).  She request screening for this.     August 17, 2022: The patient underwent right superior laryngeal nerve injection with steroid/local anesthetic.     January 2024: We reviewed the barium swallow results with the patient today.  There was no evidence of significant strictures or mucosal abnormalities.  There was notable stasis of the barium column within the esophagus after the swallow, however.  The patient reports that she has had approximately an 80% improvement in her frequency and severity of cough following her right superior laryngeal nerve injection she is quite pleased with this.  We discussed the role for further workup of esophageal dysmotility through GI and some of the likely tests to be performed.  The patient has opted to forego further workup of this complaint at this point.We discussed the role for further workup of esophageal dysmotility through GI and some of the likely tests to be performed.  The patient has opted to forego further workup of this complaint at this point.     February 2025: The patient reports that in the last year she has lost a significant amount of weight (up to 40 pounds).  She reports that in the last 1 to 2 months her voice has been fluctuating without any particular antecedent event.  She has difficulty with intelligibility on the phone, and this unable to sing.  She denies any interval significant illnesses or procedures.  I recommended behavioral voice therapy intervention. VRQOL: 50 / GFI: 15 May 2024: Reviewed the medical record reveals the patient did complete several behavioral voice therapy sessions with [facility] SLP. Sometimes  able to use cough suppression exercises. Currently in physical therapy for multi[ple issues ( back and shoulder).  The patient reports that the voices fluctuated, and she does continue to complain of chronic cough.  She occasionally remembers to utilize her techniques for cough suppression.      Interval Surgery/Medical History:    Interval  Past Medical History and ROS is otherwise unchanged except as per HPI  Medications:    Current Outpatient Medications:     albuterol  HFA 90 mcg/actuation inhaler, Inhale 2 puffs every six (6) hours as needed for wheezing., Disp: 8 g, Rfl: 11    amlodipine  (NORVASC ) 2.5 MG tablet, Take 3 tablets (7.5 mg total) by mouth daily., Disp: 90 tablet, Rfl: 3    atenolol  (TENORMIN ) 25 MG tablet, Take 1.5 tablets (37.5 mg total) by mouth two (2) times a day., Disp: 300 tablet, Rfl: 3    atorvastatin (LIPITOR) 40 MG tablet, Take 1 tablet (40 mg total) by mouth daily., Disp: , Rfl:     beclomethasone dipropionate  (QVAR  REDIHALER) 80 mcg/actuation inhaler, Inhale 2 puffs in the morning and 2 puffs in the evening., Disp: 10.6 g, Rfl: 2    biotin 1 mg cap, Take by mouth., Disp: , Rfl:     buPROPion  (WELLBUTRIN  XL) 300 MG 24 hr tablet, TAKE 1 TABLET BY MOUTH EVERY DAY, Disp: 90 tablet, Rfl: 3    carboxymethylcellulose sodium (ARTIFICIAL TEARS, [facility],) 1 % Drop, Apply to eye., Disp: , Rfl:     clonazePAM  (KLONOPIN ) 0.5 MG tablet, Take 0.5 tablets (0.25 mg total) by mouth Three (3) times a day as needed for anxiety., Disp: 30 tablet, Rfl: 1    clotrimazole -betamethasone  (LOTRISONE ) 1-0.05 % cream, Apply 1 Application topically two (2) times a day., Disp: , Rfl:     cyclobenzaprine  (FLEXERIL ) 5 MG tablet, Take 1 tablet (5 mg total) by mouth two (2) times a day as needed for muscle spasms., Disp: 120 tablet, Rfl: 1    diclofenac  sodium (VOLTAREN ) 1 % gel, Apply 2 g topically two (2) times a day as needed for arthritis., Disp: 300 g, Rfl: 5    dicyclomine (BENTYL) 20 mg tablet, Take 1 tablet (20 mg total) by mouth daily as needed., Disp: , Rfl:     famotidine  (PEPCID ) 20 MG tablet, TAKE 1 TABLET BY MOUTH TWICE  DAILY, Disp: 200 tablet, Rfl: 2    fluticasone  propionate (FLONASE ) 50 mcg/actuation nasal spray, , Disp: , Rfl:     hydrALAZINE  (APRESOLINE ) 10 MG tablet, , Disp: , Rfl:     hydroxychloroquine  (PLAQUENIL ) 200 mg tablet, Take 1 tablet (200 mg total) by mouth two (2) times a day., Disp: 180 tablet, Rfl: 3    inhalational spacing device (AEROCHAMBER MV) Spcr, 1 each by Miscellaneous route two (2) times a day. With Fovent, Disp: 1 each, Rfl: 0    lamoTRIgine  (LAMICTAL ) 200 MG tablet, Take 1 tablet (200 mg total) by mouth nightly., Disp: 90 tablet, Rfl: 0    lidocaine  (XYLOCAINE ) 5 % ointment, Apply topically four (4) times a day., Disp: 480 g, Rfl: 0    linaclotide  (LINZESS ) 290 mcg capsule, TAKE 1 CAPSULE BY MOUTH DAILY., Disp: 30 capsule, Rfl: 3    loratadine (CLARITIN) 10 mg tablet, Take 1 tablet (10 mg total) by mouth daily., Disp: , Rfl:     melatonin 10 mg Tab, Take 1 tablet by mouth., Disp: , Rfl:     metFORMIN (GLUCOPHAGE-XR) 500 MG 24 hr tablet, Take 1 tablet (500 mg total) by mouth daily before breakfast., Disp: , Rfl:     mycophenolate  (MYFORTIC ) 360 MG TbEC, Take 2 tablets (720 mg total) by mouth two (2) times a day., Disp: 360 tablet, Rfl: 3    omeprazole  (PRILOSEC) 40 MG capsule, TAKE 1 CAPSULE BY MOUTH TWO TIMES A DAY., Disp: 180 capsule, Rfl: 3    ondansetron  (ZOFRAN ) 4 MG tablet, , Disp: , Rfl:  polyethylene glycol (GLYCOLAX ) 17 gram/dose powder, Take 17 g by mouth daily., Disp: , Rfl:     pregabalin  (LYRICA ) 75 MG capsule, TAKE 1 CAPSULE (75 MG TOTAL) BY MOUTH TWO (2) TIMES A DAY., Disp: 120 capsule, Rfl: 0    telmisartan  (MICARDIS ) 80 MG tablet, Take 1 tablet (80 mg total) by mouth daily., Disp: 90 tablet, Rfl: 3    traZODone  (DESYREL ) 100 MG tablet, TAKE 1 AND 1/2 TABLETS BY MOUTH  EVERY NIGHT, Disp: 135 tablet, Rfl: 3         Pertinent ROS as above and per HPI.     Physical Exam:   Constitutional:  Vitals reviewed on nursing chart.  Voice: Mild roughness  Respiration:  Breathing comfortably, no stridor.   Ears:  Normal tympanic membranes to otoscopy,   Nose:  External nose midline, anterior rhinoscopy is normal with limited visualization just to the anterior interior turbinate.   Oral Cavity/Oropharynx/Lips:  Normal mucous membranes, normal floor of mouth/tongue/oropharynx, no masses or lesions are noted.    Pharyngeal Walls:  No masses noted.  Neck/Lymph:  No lymphadenopathy, no thyroid masses.    Procedure: 05/07/2024     Endoscopy Type:  Flexible Fiberoptic Videostroboscopy      Indications/TimeOut:  To better evaluate the patient???s symptoms, fiberoptic videostroboscopy is indicated.   A time out identifying the patient, the procedure, the location of the procedure and any concerns was performed prior to beginning the procedure.    Procedure Details:    The patient was placed in the sitting position.  After topical anesthesia and decongestion with oxymetazoline and lidocaine , the flexible laryngoscope was passed.  The microphone was used to trigger the xenon stroboscopic light source.      -  Hypopharynx - There were no lesions in the pyriformis, epiglottis, or base of tongue.  -  Larynx -  there was mild interarytenoid edema, no erythema.  -  Vocal Folds:                 Supraglottis: Normal     Infraglottis/Subglottis:  Normal     Mobility: Right VF hypomobility     Amplitude: Symmetric bilaterally     Mucosal Wave: Mucosal wave intact bilaterally     Closure: Complete     Lesions: none    Condition:  Stable.  Patient tolerated procedure well.    Complications: None    Voice- Related Quality of Life (VR-QOL) Measure:  I have trouble speaking loudly or being heard in noisy situations: 5  I run out of air and need to take frequent breaths when talking: 4  I sometimes do not know what will come out when I begin speaking: 3  I am sometimes anxious or frustrated because of my voice: 4  I sometimes get depressed because of my voice: 3  I have trouble using the telephone because of my voice: 3  I have trouble doing my job or practicing my profession because of my voice: 1  I avoid going out socially because of my voice: 2  I have to repeat myself to be understood: 5  I have become less outgoing because of my voice: 2  VRQOL Raw Score: 32  Calculated Score: 45  Glottal Function Index:  Speaking took extra effort: 2  Throat discomfort or pain after using your voice: 4  Vocal fatigue (voice weakened as you talked): 3  Voice cracks or sounds different: 2  Glottal Function Index Total: 11  Assessment:   68 y.o.  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      At the end of our previous clinic visit we discussed and recommended clinical course of Respiratory Retraining, Biotene mouthwash, D/C Listerine- switch to salt water gargles      July 2017: I recommended NeilMed rinses and a workup of glossitis (electrolyte and vitamin levels per primary care physician: B12, zinc, etc.,) Her diagnoses included right vocal fold hypomobility/depression anxiety. 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     December 2018: The patient notes that her symptoms have been relatively stable.  She continues to complain of burning tongue.  Following this visit I recommended a thyroid hormone panel and thyroid ultrasound.     August 2019: The patient reports that she continues to clear there throat and cough in a consistent manner.  Thyroid ultrasound was unremarkable w/o nodules. She occasionally (rarely ) awakens from sleep with cough. She has done a few sessions with SLP, but claims to have forgotten the techniques.  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  I additionally offered behavioral intervention for her cough symptom.     April 2021: In the interim the patient is followed up with neurosurgery and has had radiography demonstrating no additional lesions.  The patient did however complain of right-sided head throbbing, and otalgia.  Ms. Laham notes that she has continued to have balance difficulty, intermittent cough and dysphonia.  Lastly, she reports that her throat has been burning more than in the past though she has not seen her GI physician for some time.  The patient reports that she has had ongoing right otalgia that she is treated with hydrogen peroxide.  She has had her ears flushed by her primary care physician without resolution.  The patient was referred to otology for right otalgia.     June 2022: Patient returns with concerns for weak vocal projection, worsened over the past several weeks. She attributes this to her LPR, but has been taking her daily Prilosec with minimal relief. Otherwise, she has been doing well, without any additional concerns. Given the stability of the patient's physical examination and findings referable to upper airway inflammation, I have a recommended that we proceed with management of her known LPR. Add Famotidine  20 mg BID to supplement Prilosec 40 mg     October 2022: The patient returns for follow-up. She states that her voice continues to be hoarse. The patient endorses seasonal allergies. She states that she follows up with pulmonology and was started on albuterol  inhaler. She endorses coughing symptoms that has been a past ongoing issue. The patient states that she has the sensation of food getting caught in her epigastric region. She endorses abdominal pain, right under the ribcage, for the past 4-5 months and has not seen GI. She describes it as a dull pain that will last for a few minutes before going away. She states that the abdominal pain will occur randomly at rest and has generally not been benefited by reflux medication. She endorses difficulty with bowel movement, which has been an ongoing issue in the past.I reassured the patient that her larynx appears healthy on laryngoscopy today. I recommend NeilMed nasal saline irrigation for general nasopharyngeal hygiene. I will refer the patient to GI for further evaluation of gastric discomfort and  constipation.  I offered a referral to GI for gastric discomfort and constipation, and recommended NeilMed nasal irrigations.     October 2023: The patient coughs consistently  with pain in the right ear.  The cough comes throughout the day without clear inciting behaviors.  She needs water to calm it down.  The patient reports having tried reflux medication, particularly due to the fact that she also reports some burning in the throat, however this was unhelpful for her cough symptom.  The patient also reports achiness in the right ear (the same side as her previous skull base retroauricular approach). We discussed interventions for chronic cough (putative neurogenic cough).  These include neuromodulator medicines (patient currently on gabapentin  and less interested in beginning another chronic medication), as well as interventions such as SLN injection, and intra laryngeal Botox.  We have decided to proceed to trial of SLN injection on the right, given the prevalence of her symptoms on the right (ear, vocal fold hypomobility)     December 2023: Over the interval since her last visit the patient reports that her cough has been persistent slightly of less intensity recently.  She also notes that she has had some unintended weight loss, and has a specific concern that she may have esophageal cancer, having heard and anecdote about another patient (albeit with no specific current upper GI symptoms).  She request screening for this.     August 17, 2022: The patient underwent right superior laryngeal nerve injection with steroid/local anesthetic.     January 2024: We reviewed the barium swallow results with the patient today.  There was no evidence of significant strictures or mucosal abnormalities.  There was notable stasis of the barium column within the esophagus after the swallow, however.  The patient reports that she has had approximately an 80% improvement in her frequency and severity of cough following her right superior laryngeal nerve injection she is quite pleased with this.  We discussed the role for further workup of esophageal dysmotility through GI and some of the likely tests to be performed.  The patient has opted to forego further workup of this complaint at this point.We discussed the role for further workup of esophageal dysmotility through GI and some of the likely tests to be performed.  The patient has opted to forego further workup of this complaint at this point.     February 2025: The patient reports that in the last year she has lost a significant amount of weight (up to 40 pounds).  She reports that in the last 1 to 2 months her voice has been fluctuating without any particular antecedent event.  She has difficulty with intelligibility on the phone, and this unable to sing.  She denies any interval significant illnesses or procedures.  I recommended behavioral voice therapy interventionVRQO L: 50 / GFI: 15 May 2024: Reviewed the medical record reveals the patient did complete several behavioral voice therapy sessions with [facility] SLP. Sometimes  able to use cough suppression exercises. Currently in physical therapy for multi[ple issues ( back and shoulder).  The patient reports that the voices fluctuated, and she does continue to complain of chronic cough.  She occasionally remembers to utilize her techniques for cough suppression.     1. LPR  2. Slightly enlarged right Thyroid gland/ no dominant nodule  3. Burning mouth syndrome    4.  Chronic cough-significantly improved following right SLN injection (August 17, 2022)   5. MTD  6. Epigastric pain  7. Evidence of esouphageal dysmotility  8.  Likely new functional voice component        Plan:  1.  Behavioral voice therapeutic intervention if indicated     2.  Follow-up in 9 months     Plan:      The patient in agreement the plan as articulated above.      This note was created with [employer] and may have errors that were not dictated and not seen in editing.     Lamar FELIX  Hind Chesler

## 2024-05-07 DIAGNOSIS — R053 Chronic cough: Principal | ICD-10-CM

## 2024-05-07 NOTE — Unmapped (Signed)
 Flexible endoscope serial number  [id number]  used today by Lawrence Marseilles, MD

## 2024-05-09 ENCOUNTER — Other Ambulatory Visit: Payer: Self-pay | Admitting: Family Medicine

## 2024-05-09 DIAGNOSIS — N644 Mastodynia: Secondary | ICD-10-CM

## 2024-05-10 NOTE — Unmapped (Signed)
 I spoke with patient Caitlin Smith to confirm appointments on the following date(s):   05/17/24 for Visit  with Devere Cooler, NP    Bernice RAMAN.   Admin Specialist   [phone]

## 2024-05-14 ENCOUNTER — Other Ambulatory Visit: Payer: Self-pay | Admitting: Family Medicine

## 2024-05-14 ENCOUNTER — Other Ambulatory Visit: Payer: Self-pay | Admitting: *Deleted

## 2024-05-14 ENCOUNTER — Inpatient Hospital Stay
Admission: RE | Admit: 2024-05-14 | Discharge: 2024-05-14 | Disposition: A | Discharge status: Home / Self Care | Payer: Self-pay | Source: Ambulatory Visit | Attending: Family Medicine | Admitting: Family Medicine

## 2024-05-14 DIAGNOSIS — Z1231 Encounter for screening mammogram for malignant neoplasm of breast: Secondary | ICD-10-CM

## 2024-05-14 DIAGNOSIS — I10 Essential (primary) hypertension: Secondary | ICD-10-CM

## 2024-05-14 DIAGNOSIS — I700 Atherosclerosis of aorta: Secondary | ICD-10-CM

## 2024-05-14 MED ORDER — TRAZODONE 100 MG TABLET
ORAL_TABLET | ORAL | 3 refills | 0.00000 days | Status: CP
Start: 2024-05-14 — End: ?

## 2024-05-16 DIAGNOSIS — M3219 Other organ or system involvement in systemic lupus erythematosus: Principal | ICD-10-CM

## 2024-05-16 MED ORDER — MYCOPHENOLATE SODIUM 360 MG TABLET,DELAYED RELEASE
ORAL_TABLET | Freq: Two times a day (BID) | ORAL | 3 refills | 90.00000 days
Start: 2024-05-16 — End: ?

## 2024-05-17 ENCOUNTER — Ambulatory Visit: Admit: 2024-05-17 | Discharge: 2024-05-18 | Payer: Medicare (Managed Care)

## 2024-05-17 ENCOUNTER — Ambulatory Visit
Admit: 2024-05-17 | Discharge: 2024-05-18 | Payer: Medicare (Managed Care) | Attending: Psychologist | Primary: Psychologist

## 2024-05-17 DIAGNOSIS — N644 Mastodynia: Principal | ICD-10-CM

## 2024-05-17 MED ORDER — MYCOPHENOLATE SODIUM 360 MG TABLET,DELAYED RELEASE
ORAL_TABLET | Freq: Two times a day (BID) | ORAL | 3 refills | 90.00000 days | Status: CP
Start: 2024-05-17 — End: ?
  Filled 2024-05-28: qty 120, 30d supply, fill #0

## 2024-05-17 NOTE — Unmapped (Signed)
 [facility]  refill  Last Visit Date: 01/18/2024  Next Visit Date: 06/05/2024    Lab Results   Component Value Date    ALT 14 01/18/2024    AST 21 01/18/2024    ALBUMIN 3.6 02/29/2024    CREATININE 0.89 02/29/2024     Lab Results   Component Value Date    WBC 4.1 01/18/2024    HGB 14.1 01/18/2024    HCT 41.9 01/18/2024    PLT 285 01/18/2024     Lab Results   Component Value Date    NEUTROPCT 36.8 01/18/2024    LYMPHOPCT 44.4 01/18/2024    MONOPCT 13.8 01/18/2024    EOSPCT 3.6 01/18/2024    BASOPCT 1.4 01/18/2024
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
## 2024-05-20 ENCOUNTER — Other Ambulatory Visit: Payer: Self-pay | Admitting: Family Medicine

## 2024-05-20 DIAGNOSIS — I700 Atherosclerosis of aorta: Secondary | ICD-10-CM

## 2024-05-20 DIAGNOSIS — I10 Essential (primary) hypertension: Secondary | ICD-10-CM

## 2024-05-20 NOTE — Telephone Encounter (Signed)
 Copied from CRM #[id number]. Topic: Clinical - Medication Refill >> May 20, 2024  8:51 AM Wess RAMAN wrote: Medication: atenolol  (TENORMIN ) 25 MG tablet  telmisartan  (MICARDIS ) 80 MG tablet  atorvastatin  (LIPITOR) 40 MG tablet   Has the patient contacted their pharmacy? Yes (Agent: If no, request that the patient contact the pharmacy for the refill. If patient does not wish to contact the pharmacy document the reason why and proceed with request.) (Agent: If yes, when and what did the pharmacy advise?) Pharmacy needs refill prescription  This is the patient's preferred pharmacy:  [facility] - [place], [place] - [address] [facility] [address] [place] [address] [place] [address] Phone: [phone] Fax: [phone]   Is this the correct pharmacy for this prescription? Yes If no, delete pharmacy and type the correct one.   Has the prescription been filled recently? Yes  Is the patient out of the medication? Yes  Has the patient been seen for an appointment in the last year OR does the patient have an upcoming appointment? Yes  Can we respond through MyChart? Yes  Agent: Please be advised that Rx refills may take up to 3 business days. We ask that you follow-up with your pharmacy.

## 2024-05-21 NOTE — Telephone Encounter (Signed)
 Too soon for refill for refill, LRF 09/10/23 FOR 100 AND 2 RF.  Requested Prescriptions  Pending Prescriptions Disp Refills   atenolol  (TENORMIN ) 25 MG tablet      Sig: Take 1 tablet (25 mg total) by mouth in the morning and at bedtime.     Cardiovascular: Beta Blockers 2 Failed - 05/21/2024  2:14 PM      Failed - Cr in normal range and within 360 days    Creat  Date Value Ref Range Status  04/10/2023 0.85 0.50 - 1.05 mg/dL Final   Creatinine, Ser  Date Value Ref Range Status  05/01/2024 1.02 (H) 0.44 - 1.00 mg/dL Final   Creatinine, Urine  Date Value Ref Range Status  11/07/2019 62 20 - 275 mg/dL Final         Passed - Last BP in normal range    BP Readings from Last 1 Encounters:  05/03/24 134/84         Passed - Last Heart Rate in normal range    Pulse Readings from Last 1 Encounters:  05/03/24 75         Passed - Valid encounter within last 6 months    Recent Outpatient Visits           2 weeks ago Benign hypertension   [facility] Glenard Mire, MD   1 month ago Well adult exam   [facility] Glenard Mire, MD   4 months ago Empty sella [facility])   [place] Glenard Mire, MD   7 months ago Frequent falls   [facility] Glenard Mire, MD       Future Appointments             In 1 month Sowles, Krichna, MD [facility] [place]             telmisartan  (MICARDIS ) 80 MG tablet 100 tablet 2    Sig: Take 1 tablet (80 mg total) by mouth daily.     Cardiovascular:  Angiotensin Receptor Blockers Failed - 05/21/2024  2:14 PM      Failed - Cr in normal range and within 180 days    Creat  Date Value Ref Range Status  04/10/2023 0.85 0.50 - 1.05 mg/dL Final   Creatinine, Ser  Date Value Ref Range Status  05/01/2024 1.02 (H) 0.44 - 1.00 mg/dL Final   Creatinine, Urine  Date Value Ref Range Status  11/07/2019 62 20 -  275 mg/dL Final         Passed - K in normal range and within 180 days    Potassium  Date Value Ref Range Status  05/01/2024 4.0 3.5 - 5.1 mmol/L Final  12/10/2013 3.7 3.5 - 5.1 mmol/L Final         Passed - Patient is not pregnant      Passed - Last BP in normal range    BP Readings from Last 1 Encounters:  05/03/24 134/84         Passed - Valid encounter within last 6 months    Recent Outpatient Visits           2 weeks ago Benign hypertension   [facility] Sowles, Krichna, MD   1 month ago Well adult exam   [facility] Glenard Mire, MD   4 months ago Empty sella [facility])   [facility]  Sowles, Krichna, MD   7 months ago Frequent falls   [facility] Glenard Mire, MD       Future Appointments             In 1 month Sowles, Krichna, MD [facility] [place]             atorvastatin  (LIPITOR) 40 MG tablet 100 tablet 2    Sig: Take 1 tablet (40 mg total) by mouth daily.     Cardiovascular:  Antilipid - Statins Failed - 05/21/2024  2:14 PM      Failed - Lipid Panel in normal range within the last 12 months    Cholesterol, Total  Date Value Ref Range Status  05/06/2015 178 100 - 199 mg/dL Final   Cholesterol  Date Value Ref Range Status  07/04/2023 124 <200 mg/dL Final   LDL Cholesterol (Calc)  Date Value Ref Range Status  07/04/2023 50 mg/dL (calc) Final    Comment:    Reference range: <100 . Desirable range <100 mg/dL for primary prevention;   <70 mg/dL for patients with CHD or diabetic patients  with > or = 2 CHD risk factors. SABRA LDL-C is now calculated using the Martin-Hopkins  calculation, which is a validated novel method providing  better accuracy than the Friedewald equation in the  estimation of LDL-C.  Gladis APPLETHWAITE et al. SANDREA. 7986;689(80): 2061-2068  (http://education.[facility].com/faq/FAQ164)     HDL  Date Value Ref Range Status  07/04/2023 60 > OR = 50 mg/dL Final  91/68/7983 56 >60 mg/dL Final    Comment:    According to ATP-III Guidelines, HDL-C >59 mg/dL is considered a negative risk factor for CHD.    Triglycerides  Date Value Ref Range Status  07/04/2023 60 <150 mg/dL Final         Passed - Patient is not pregnant      Passed - Valid encounter within last 12 months    Recent Outpatient Visits           2 weeks ago Benign hypertension   [facility] Glenard Mire, MD   1 month ago Well adult exam   [facility] Glenard Mire, MD   4 months ago Empty sella [facility])   [facility] Glenard Mire, MD   7 months ago Frequent falls   [facility] Sowles, Krichna, MD       Future Appointments             In 1 month Glenard, Krichna, MD [facility], [place]

## 2024-05-21 NOTE — Telephone Encounter (Signed)
 Requested medication (s) are due for refill today: routing for review  Requested medication (s) are on the active medication list: no  Last refill:  05/26/24  Future visit scheduled: yes  Notes to clinic:  Unable to refill per protocol, historical medication     Requested Prescriptions  Pending Prescriptions Disp Refills   atenolol  (TENORMIN ) 25 MG tablet      Sig: Take 1 tablet (25 mg total) by mouth in the morning and at bedtime.     Cardiovascular: Beta Blockers 2 Failed - 05/21/2024  2:18 PM      Failed - Cr in normal range and within 360 days    Creat  Date Value Ref Range Status  04/10/2023 0.85 0.50 - 1.05 mg/dL Final   Creatinine, Ser  Date Value Ref Range Status  05/01/2024 1.02 (H) 0.44 - 1.00 mg/dL Final   Creatinine, Urine  Date Value Ref Range Status  11/07/2019 62 20 - 275 mg/dL Final         Passed - Last BP in normal range    BP Readings from Last 1 Encounters:  05/03/24 134/84         Passed - Last Heart Rate in normal range    Pulse Readings from Last 1 Encounters:  05/03/24 75         Passed - Valid encounter within last 6 months    Recent Outpatient Visits           2 weeks ago Benign hypertension   [facility] Glenard Mire, MD   1 month ago Well adult exam   [facility] Glenard Mire, MD   4 months ago Empty sella [facility])   [facility] Glenard Mire, MD   7 months ago Frequent falls   [facility] Glenard Mire, MD       Future Appointments             In 1 month Sowles, Krichna, MD [facility], [place]            Refused Prescriptions Disp Refills   telmisartan  (MICARDIS ) 80 MG tablet 100 tablet 2    Sig: Take 1 tablet (80 mg total) by mouth daily.     Cardiovascular:  Angiotensin Receptor Blockers Failed - 05/21/2024  2:18 PM      Failed - Cr in normal range and within 180 days     Creat  Date Value Ref Range Status  04/10/2023 0.85 0.50 - 1.05 mg/dL Final   Creatinine, Ser  Date Value Ref Range Status  05/01/2024 1.02 (H) 0.44 - 1.00 mg/dL Final   Creatinine, Urine  Date Value Ref Range Status  11/07/2019 62 20 - 275 mg/dL Final         Passed - K in normal range and within 180 days    Potassium  Date Value Ref Range Status  05/01/2024 4.0 3.5 - 5.1 mmol/L Final  12/10/2013 3.7 3.5 - 5.1 mmol/L Final         Passed - Patient is not pregnant      Passed - Last BP in normal range    BP Readings from Last 1 Encounters:  05/03/24 134/84         Passed - Valid encounter within last 6 months    Recent Outpatient Visits           2 weeks ago Benign hypertension   [facility] Glenard Mire, MD   1 month ago Well adult exam   [facility] Glenard Mire, MD   4 months ago Empty sella [facility])   [facility] Glenard Mire, MD   7 months ago Frequent falls   [facility] Glenard Mire, MD       Future Appointments             In 1 month Sowles, Krichna, MD [facility] [place]             atorvastatin  (LIPITOR) 40 MG tablet 100 tablet 2    Sig: Take 1 tablet (40 mg total) by mouth daily.     Cardiovascular:  Antilipid - Statins Failed - 05/21/2024  2:18 PM      Failed - Lipid Panel in normal range within the last 12 months    Cholesterol, Total  Date Value Ref Range Status  05/06/2015 178 100 - 199 mg/dL Final   Cholesterol  Date Value Ref Range Status  07/04/2023 124 <200 mg/dL Final   LDL Cholesterol (Calc)  Date Value Ref Range Status  07/04/2023 50 mg/dL (calc) Final    Comment:    Reference range: <100 . Desirable range <100 mg/dL for primary prevention;   <70 mg/dL for patients with CHD or diabetic patients  with > or = 2 CHD risk factors. SABRA LDL-C is now calculated using the  Martin-Hopkins  calculation, which is a validated novel method providing  better accuracy than the Friedewald equation in the  estimation of LDL-C.  Gladis APPLETHWAITE et al. SANDREA. 7986;689(80): 2061-2068  (http://education.[facility].com/faq/FAQ164)    HDL  Date Value Ref Range Status  07/04/2023 60 > OR = 50 mg/dL Final  91/68/7983 56 >60 mg/dL Final    Comment:    According to ATP-III Guidelines, HDL-C >59 mg/dL is considered a negative risk factor for CHD.    Triglycerides  Date Value Ref Range Status  07/04/2023 60 <150 mg/dL Final         Passed - Patient is not pregnant      Passed - Valid encounter within last 12 months    Recent Outpatient Visits           2 weeks ago Benign hypertension   [facility] Glenard Mire, MD   1 month ago Well adult exam   [facility] Glenard Mire, MD   4 months ago Empty sella [facility])   [facility] Glenard Mire, MD   7 months ago Frequent falls   [facility] Sowles, Krichna, MD       Future Appointments             In 1 month Glenard, Krichna, MD [facility] [place]

## 2024-05-22 ENCOUNTER — Ambulatory Visit
Admission: RE | Admit: 2024-05-22 | Discharge: 2024-05-22 | Disposition: A | Discharge status: Home / Self Care | Source: Ambulatory Visit | Attending: Family Medicine | Admitting: Family Medicine

## 2024-05-22 DIAGNOSIS — N644 Mastodynia: Secondary | ICD-10-CM | POA: Insufficient documentation

## 2024-05-22 NOTE — Unmapped (Signed)
 [facility]  Psychiatry   Established Patient E&M Service - Outpatient     Assessment:  Caitlin Smith presents for follow-up evaluation.     Patient remains psychiatrically stable. Reports stable mood and denies symptoms of anxiety or depression. At last visit, we reviewed current medication regimen and discussed ways to reduce polypharmacy. Lamictal  was decreased from 200 mg BID to 200 mg once daily and she tolerated this well. She is to continue to try tapering Klonopin  (currently taking 0.25 mg nightly) -- she will try taking a quarter of tablet. No acute safety concerns. RTC in ~8 weeks. Will conduct baseline MOCA at next visit if able to present in-person.    Identifying Information:  Caitlin Smith is a 68 y.o. female with a history of  MDD, GAD, and Panic Disorder in the context of many chronic medical conditions including chronic pain/fibromyalgia, osteoarthritis with degenerative disc disease, Lupus, pituitary tumor (benign), s/p right total knee arthroplasty, and VIN II (s/p resection), who presents for follow-up evaluation. She had been maintained on Lamictal  for many years as well as cymbalta , trazodone , klonopin , and wellbutrin . Currently medication adjusted to Wellbutrin , Lamictal , and Klonopin  due to concerns for polypharmacy and impact on cognition. Her mood can be exacerbated by steroid use (for pulmonary disease). Patient was engaged in DBT therapy at the pain clinic for many years- found it helpful.     Past medications:  Cymbalta   Effexor      Risk Assessment:  An assessment of suicide and violence risk factors was performed as part of this evaluation and is not significantly changed from the last visit. While future psychiatric events cannot be accurately predicted, the patient does not currently require acute inpatient psychiatric care and does not currently meet [place]  involuntary commitment criteria.      Plan:  Problem: Major depression, Anxiety NOS, Panic Disorder Status of problem: chronic and stable  Interventions:   -- Continue Lamictal  200mg  qhs for mood (patient previously taking 200 mg BID)  -- Continue clonazepam  0.25 mg nightly PRN (She will experiment taking a quarter of a tablet in an attempt to taper medication)  -- Continue Wellbutrin  300mg  daily   -- Followed by Dr. Greig Holland with pain management for therapy     Problem: Insomnia   Status of problem: chronic and stable  Interventions:   Last sleep study performed in 2017. Per chart review, showed mild OSA likely related to medication use. Neurology recommends patient avoid sleeping on back as she likely has OSA when sleeping in this position  -- Continue trazodone  150 mg at bedtime  -- Continue melatonin 3-5mg  at bedtime   -- Clonazepam  per above    Problem: Concern for Mild Major Neurocognitive Disorder   Status of problem:  new problem to this provider  Interventions:  Patient seen by [facility]rology ([facility]) 05/22/18 who dx pt with mild neurocognitive impairment w/ deficits in inattention and visual-spatial function with suspicion that sleep apnea and polypharmacy may be contributing to her difficulties. Also likely that patient's Lupus is significantly contributing to cognitive decline.  -- Last MOCA 22 on 12/12/2017  -- Repeat MOCA at next in-person visit   -- Consider transition from Wellbutrin  to Zoloft or Lexapro for anxiety, tapering Klonopin , and decreasing trazodone     Problem: Polypharmacy   Status of problem:  chronic and stable  Interventions:  -- Will continue to encourage Klonopin  taper  -- Consider trazodone  taper in the future    Problem: High Risk Medication  monitoring   Status of problem:  Stable   Interventions:   The complexity of this patient's care involves drug therapy which requires intensive monitoring for toxicity. This is in part due to a narrow therapeutic window, as well as potential for toxicity. This patient is being treated with Lamotrigine  (Lamictal ) to target their psychiatric disorder, Major depressive disorder, refractory. In regards to this medication being considered high risk, Lamotrigine  (lamictal ) has black box warning for serious rash including fatal reaction of Stevens-Johnson syndrome and rare cases of toxic epidermal necrolysis. In addition to prolonged titration to mitigate risk of serious rash, lamotrigine  requires monitoring of hepatic and renal function at baseline, and at times will require monitoring of lamotrigine  blood levels.  - CBC 01/18/24 with neutropenia, ANC 1.5  - CMP 01/18/24 Cr 0.89, eGFR 72   - AST and ALT 01/18/24 WNLs    Problem: Chronic Pain  Status of problem:  chronic and stable  Interventions:  -- Pregabalin  50 mg BID  (prescribed by PCP)  -- Flexeril  5 mg PRN, once at night (prescribed by PCP)      Psychotherapy provided:  No billable psychotherapy service provided but brief supportive therapy was utilized.    Patient has been given this writer's contact information as well as the [facility] Psychiatry urgent line number. The patient has been instructed to call 911 for emergencies.    Patient was seen and plan of care was discussed with the Attending MD,Dr. Gust, who agrees with the above statement and plan.    Subjective:    Interval events since last visit:  Patient reports things have been going mostly well. Has had some anxiety about health concerns; has been participating in respiratory therapy to help build lung function. Reports she tolerated decrease in Lamictal  dose well and has been trying to take a quarter tablet of Klonopin  in effort to wean medication but has been anxious to try this, but will try over the next month.      Mood: Okay. Denies feeling depressed or hopeless. Continues to walk two miles daily.      Sleep: Some difficulty with falling asleep but reports this is not every night. Reports good sleep maintenance and sleeps approximately 7 hours.     Appetite: Reports eating okay. Trying to adhere to healthy diet because of elevated BP.      Therapy: Receives therapy through pain management. Last visit was last Friday. DR. Amy Goetzinger      Unsafe Thought/SI/HI: Denies     Meds:   -- Lamictal  200mg  nightly (reduced at last visit, tolerated this well)   -- Clonazepam  0.5mg  (taking half tablet at night, but sometimes full tablet but not often)  -- Wellbutrin  XL 300mg  daily (was taking this   -- Trazodone  150 mg nightly   -- Pregabalin  50 mg BID  (prescribed by PCP)  -- Flexeril  5 mg PRN, once at night (prescribed by PCP)    Objective:    Mental Status Exam:    Appearance:    Appears stated age, Well nourished and Clean/Neat   Motor:    No abnormal movements appreciated   Speech/Language:    Normal rate, volume, tone, fluency and Language intact, well formed   Mood:   Okay   Affect:   Calm, Cooperative and Full   Thought process and Associations:   Logical, linear, clear, coherent, goal directed   Abnormal/psychotic thought content:     Denies SI, HI, self harm, delusions, obsessions, paranoid ideation, or ideas of  reference   Perceptual disturbances:     Denies auditory and visual hallucinations, behavior not concerning for response to internal stimuli     Other:   insight/judgement intact        The patient reports they are physically located in   and is currently: at home. I conducted a audio/video visit. I spent  76m 18s on the video call with the patient. I spent an additional 10 minutes on pre- and post-visit activities on the date of service .     Alfonso LITTIE Legato, MD  PGY-2 Psychiatry

## 2024-05-23 DIAGNOSIS — F411 Generalized anxiety disorder: Principal | ICD-10-CM

## 2024-05-23 DIAGNOSIS — F32A Depressive disorder: Principal | ICD-10-CM

## 2024-05-23 MED ORDER — CLONAZEPAM 0.5 MG TABLET
ORAL_TABLET | Freq: Every evening | ORAL | 1 refills | 30.00000 days | Status: CP | PRN
Start: 2024-05-23 — End: 2024-07-22

## 2024-05-23 MED ORDER — TRAZODONE 100 MG TABLET
ORAL_TABLET | Freq: Every evening | ORAL | 0 refills | 90.00000 days | Status: CP
Start: 2024-05-23 — End: 2024-08-21

## 2024-05-23 MED ORDER — BUPROPION HCL XL 300 MG 24 HR TABLET, EXTENDED RELEASE
ORAL_TABLET | Freq: Every day | ORAL | 0 refills | 90.00000 days | Status: CP
Start: 2024-05-23 — End: 2024-08-21

## 2024-05-23 MED ORDER — LAMOTRIGINE 200 MG TABLET
ORAL_TABLET | Freq: Every evening | ORAL | 0 refills | 90.00000 days | Status: CP
Start: 2024-05-23 — End: ?

## 2024-05-23 NOTE — Unmapped (Signed)
 [facility] Refill Coordination Note    Specialty Medication(s) to be Shipped:   Inflammatory Disorders: mycophenolate     Other medication(s) to be shipped: No additional medications requested for fill at this time    Specialty Medications not needed at this time: N/A     Caitlin Smith, DOB: 1955/09/13  Phone: [phone] (home) [phone] (work)      All above HIPAA information was verified with patient.     Was a Nurse, learning disability used for this call? No    Completed refill call assessment today to schedule patient's medication shipment from the [facility]  [phone]).  All relevant notes have been reviewed.     Specialty medication(s) and dose(s) confirmed: Regimen is correct and unchanged.   Changes to medications: [name] reports no changes at this time.  Changes to insurance: No  New side effects reported not previously addressed with a pharmacist or physician: None reported  Questions for the pharmacist: No    Confirmed patient received a Conservation officer, historic buildings and a Surveyor, mining with first shipment. The patient will receive a drug information handout for each medication shipped and additional FDA Medication Guides as required.       DISEASE/MEDICATION-SPECIFIC INFORMATION        N/A    SPECIALTY MEDICATION ADHERENCE     Medication Adherence    Patient reported X missed doses in the last month: 0  Specialty Medication: mycophenolate  360 MG Tbec (MYFORTIC )  Patient is on additional specialty medications: No              Were doses missed due to medication being on hold? No      mycophenolate  360 MG Tbec (MYFORTIC ): 5 days of medicine on hand       REFERRAL TO PHARMACIST     Referral to the pharmacist: Not needed      [facility]     Shipping address confirmed in Epic.     Cost and Payment: Patient has a $0 copay, payment information is not required.    Delivery Scheduled: Yes, Expected medication delivery date: 9.24.25.     Medication will be delivered via Next Day Courier to the prescription address in Epic WAM.    [name]   [facility]  Specialty Technician

## 2024-05-24 NOTE — Unmapped (Signed)
 Caller states that she mistakenly told her provider she was taking 2 buPROPion  (WELLBUTRIN )  in the mornings, Caller states that she is only taking 1 buPROPion  (WELLBUTRIN ).    Please advise.      Thanks,  YASMIN

## 2024-05-24 NOTE — Unmapped (Signed)
 Follow-up instructions:  - Please continue taking your medications as prescribed for your mental health.   - Do not make changes to your medications, including taking more or less than prescribed, unless under the supervision of your physician. Be aware that some medications may make you feel worse if abruptly stopped  - Please refrain from using illicit substances, as these can affect your mood and could cause anxiety or other concerning symptoms.   - Seek further medical care for any increase in symptoms or new symptoms such as thoughts of wanting to hurt yourself or hurt others.     Contact info:  Life-threatening emergencies: Call 911, the 988 suicide and crisis lifeline, or go to the nearest ER for medical or psychiatric attention.           Issues that need urgent attention but are not life threatening: Call the clinic outpatient front desk for assistance:    - [phone] for [facility] clinics located at [address] [facility]  - [phone] for [facility] child and adolescent psychiatry clinics located at [address]  - [phone] for [facility] and adult psychiatry clinics located at [address]    Non-urgent routine concerns and questions: Send a message through [facility] or call our clinic front desk.    Refill requests: Check with your pharmacy to initiate refill requests.    Regarding appointments:  - If you need to cancel your appointment, we ask that you call your clinic at least 24 hours before your scheduled appointment at the numbers listed above.  - If for any reason you arrive 15 minutes later than your scheduled appointment time, you may not be seen and your visit may be rescheduled.  - Please remember that we will not automatically reschedule missed appointments.  - If you miss two (2) appointments without letting us know in advance, you will likely be referred to a provider in your community.  - We will do our best to be on time. Sometimes an emergency will arise that might cause your clinician to be late. We will try to inform you of this when you check in for your appointment. If you wait more than 15 minutes past your appointment time without such notice, please speak with the front desk staff.    In the event of bad weather, the clinic staff will attempt to contact you, should your appointment need to be rescheduled. Additionally, you can call the Patient Weather Line [phone] for system-wide clinic status    For more information and reminders regarding clinic policies (these were provided when you were admitted to the clinic), please ask the front desk.

## 2024-05-25 MED ORDER — ATENOLOL 25 MG TABLET
ORAL_TABLET | Freq: Two times a day (BID) | ORAL | 2 refills | 0.00000 days
Start: 2024-05-25 — End: ?

## 2024-05-25 NOTE — Unmapped (Signed)
This was a video service where a resident was involved. The resident and the patient met via video.  As the attending physician, I spent 5 minutes in medical discussion with the patient via real-time audio and video, participating in the key portions of the service.I spent an additional 10 minutes on pre- and post-visit activities which were specific to the patient and included reviewing the patient’s medical records, lab results, imaging results, and other pertinent records. I reviewed the resident's note and I agree with the resident's findings and plan.    Cristino Degroff L Ainara Eldridge, MD

## 2024-05-28 MED ORDER — ATENOLOL 25 MG TABLET
ORAL_TABLET | Freq: Two times a day (BID) | ORAL | 3 refills | 100.00000 days | Status: CP
Start: 2024-05-28 — End: ?

## 2024-05-28 NOTE — Unmapped (Signed)
 Name from pharmacy: Atenolol  25 MG Oral Tablet         Will file in chart as: atenolol  (TENORMIN ) 25 MG tablet    The original prescription was reordered on 05/28/2024 by Merla Calton Amble, AGNP. Renewing this prescription may not be appropriate.

## 2024-06-05 ENCOUNTER — Ambulatory Visit

## 2024-06-05 ENCOUNTER — Ambulatory Visit: Admit: 2024-06-05 | Discharge: 2024-06-06 | Payer: Medicare (Managed Care)

## 2024-06-05 DIAGNOSIS — M329 Systemic lupus erythematosus, unspecified: Principal | ICD-10-CM

## 2024-06-05 LAB — URINALYSIS WITH MICROSCOPY WITH CULTURE REFLEX PERFORMABLE
BACTERIA: NONE SEEN /HPF
BILIRUBIN UA: NEGATIVE
BLOOD UA: NEGATIVE
GLUCOSE UA: NEGATIVE
KETONES UA: NEGATIVE
NITRITE UA: NEGATIVE
PH UA: 5.5 (ref 5.0–9.0)
PROTEIN UA: 30 — AB
RBC UA: 1 /HPF (ref <=4)
SPECIFIC GRAVITY UA: 1.022 (ref 1.003–1.030)
SQUAMOUS EPITHELIAL: <1 /HPF (ref 0–5)
UROBILINOGEN UA: 2 — AB
WBC UA: 1 /HPF (ref 0–5)

## 2024-06-05 LAB — PROTEIN / CREATININE RATIO, URINE
CREATININE, URINE: 182.3 mg/dL
PROTEIN URINE: 33.9 mg/dL
PROTEIN/CREAT RATIO, URINE: 0.186

## 2024-06-05 LAB — CBC W/ AUTO DIFF
BASOPHILS ABSOLUTE COUNT: 0 10*9/L (ref 0.0–0.1)
BASOPHILS RELATIVE PERCENT: 0.7 %
EOSINOPHILS ABSOLUTE COUNT: 0.1 10*9/L (ref 0.0–0.5)
EOSINOPHILS RELATIVE PERCENT: 3.2 %
HEMATOCRIT: 39.3 % (ref 34.0–44.0)
HEMOGLOBIN: 13 g/dL (ref 11.3–14.9)
LYMPHOCYTES ABSOLUTE COUNT: 1.5 10*9/L (ref 1.1–3.6)
LYMPHOCYTES RELATIVE PERCENT: 36.6 %
MEAN CORPUSCULAR HEMOGLOBIN CONC: 33.1 g/dL (ref 32.0–36.0)
MEAN CORPUSCULAR HEMOGLOBIN: 27.7 pg (ref 25.9–32.4)
MEAN CORPUSCULAR VOLUME: 83.6 fL (ref 77.6–95.7)
MEAN PLATELET VOLUME: 7.3 fL (ref 6.8–10.7)
MONOCYTES ABSOLUTE COUNT: 0.5 10*9/L (ref 0.3–0.8)
MONOCYTES RELATIVE PERCENT: 11.2 %
NEUTROPHILS ABSOLUTE COUNT: 2 10*9/L (ref 1.8–7.8)
NEUTROPHILS RELATIVE PERCENT: 48.3 %
PLATELET COUNT: 293 10*9/L (ref 150–450)
RED BLOOD CELL COUNT: 4.7 10*12/L (ref 3.95–5.13)
RED CELL DISTRIBUTION WIDTH: 14.2 % (ref 12.2–15.2)
WBC ADJUSTED: 4 10*9/L (ref 3.6–11.2)

## 2024-06-05 LAB — CREATININE
CREATININE: 0.91 mg/dL (ref 0.55–1.02)
EGFR CKD-EPI (2021) FEMALE: 70 mL/min/1.73m2 (ref >=60)

## 2024-06-05 LAB — C3 COMPLEMENT: C3 COMPLEMENT: 146 mg/dL (ref 90–170)

## 2024-06-05 LAB — BUN: BLOOD UREA NITROGEN: 5 mg/dL — ABNORMAL LOW (ref 9–23)

## 2024-06-05 LAB — C4 COMPLEMENT: C4 COMPLEMENT: 25.5 mg/dL (ref 12.0–36.0)

## 2024-06-05 MED ORDER — HYDROXYCHLOROQUINE 200 MG TABLET
ORAL_TABLET | Freq: Two times a day (BID) | ORAL | 3 refills | 90.00000 days | Status: CP
Start: 2024-06-05 — End: ?

## 2024-06-05 NOTE — Unmapped (Signed)
 Rheumatology return visit    Chief Compliant: SLE    HPI: Caitlin Smith is a 68 y.o. female who is here today for follow-up for SLE, OA, and fibromyalgia.  Initially diagnosed in 2006 with SLE presenting with episcleritis and arthralgia with serology showing +ANA, +dsDNA.  She also has stage I CKD felt secondary not due to SLE but due to HTN and obesity.   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  Treatment history:  - HCQ  - Previously treated with prednisone  for eosinophilic pneumonitis with difficulty tapering due to pulm symptoms, so cellcept  was added 2018.   - Cellcept  changed to myfortic  2019 due to GI distress.   - tapered off prednisone  in late 2019  - Derm eval in 07/2018 consistent with inverse psoriasis, possibly due to prednisone  use. Recommended avoiding systemic steroids when possible.   - Myfortic  increased to 720 mg BID in 09/2021 due to worsening ILD.   - Prednisone  (plan to be taken indefinitely) resumed by pulmonology in 12/2021. Ultimately tapered off prednisone  in 06/2023.  - Current med regimen: Myfortic  720 mg twice daily, Plaquenil  200 mg twice daily. Also followed by pain clinic for pharmacotherapy for fibromyalgia.     History of Present Illness  Caitlin Smith is a 68 year old female with lupus who presents for a rheumatology follow-up.    She is in her second week of pulmonary rehabilitation, attending three times a week for an hour and a half. She has not yet received the necessary equipment, such as tubes, a finger monitor, and a blood pressure cuff, which has hindered her ability to complete the rehab. She is working on using a device to improve her lung capacity but has not progressed past a certain point.    She experiences ongoing aching in her back and shoulder. She has been attending physical therapy sessions for these issues but paused her shoulder therapy to prioritize pulmonary rehabilitation. She plans to resume shoulder therapy after completing her lung rehab.    She recently underwent dental work, having multiple teeth extracted last Tuesday. Her mouth remains sore, and she is limited to eating soft foods like potatoes, gravy, and applesauce. She is awaiting dentures now.     She continues to take Plaquenil  twice daily and Myfortic  twice daily. No fevers or chills. Her weight has remained stable between 185 and 190 pounds, though she notes previous weight loss attributed to stress.    Her skin is generally doing well, but she experiences itching under her breasts and stomach, for which she uses cortisone cream. She has not followed up with dermatology about this. She has a hx of inverse psoriasis.     She has a history of trigger finger, which was previously treated with a shot, but the condition has recurred in the same finger, causing pain when the finger is extended. She would like to consider another shot for this.     She maintains a routine of walking for 30 to 45 minutes, four times a week.          ROS??: Attests to the above, otherwise, review of ten system is negative.  ??  Past Medical and Surgical History:  ??  Patient Active Problem List    Diagnosis Date Noted    Chronic pain of both shoulders 02/15/2024    Balance problem 02/01/2024    Diabetic polyneuropathy associated with type 2 diabetes mellitus    (  CMS-HCC) 02/01/2024    Lumbar facet arthropathy 10/02/2023    PVC (premature ventricular contraction) 08/03/2022    Immunocompromised (HHS-HCC) 04/29/2021    Otalgia 12/03/2019    Hyperglycemia 10/23/2017    Bronchiolitis obliterans organizing pneumonia    (CMS-HCC) 09/29/2016    Vertigo 09/29/2016    Neck pain 12/15/2015    Fibromyalgia 10/12/2015    Chronic constipation 04/11/2015    Chronic recurrent major depressive disorder 04/11/2015    Dyslipidemia 04/11/2015    Obesity, diabetes, and hypertension syndrome    (CMS-HCC) 04/11/2015    Intertrigo 04/11/2015 Fibrositis 04/11/2015    Gastroesophageal reflux disease without esophagitis 04/11/2015    Vitamin D deficiency 04/11/2015    Seborrhea capitis 04/11/2015    Perennial allergic rhinitis with seasonal variation 04/11/2015    Moderate dysplasia of vulva 04/11/2015    Spinal stenosis of lumbar region 04/11/2015    Keratosis pilaris 04/11/2015    Insomnia, persistent 04/11/2015    Auditory impairment 04/11/2015    Eczema intertrigo 04/11/2015    Neurogenic claudication 04/11/2015    Screening for cardiovascular condition 03/05/2015    Encounter for screening for cardiovascular disorders 03/05/2015    Treadmill stress test negative for angina pectoris 03/05/2015    Cough 05/20/2014    Shortness of breath 05/20/2014    Regurgitation 04/24/2014    Epigastric burning sensation 04/24/2014    Obesity with body mass index 30 or greater 02/03/2014    S/P total knee replacement 12/31/2013    Asthma (HHS-HCC) 12/31/2013    Asthma, chronic (HHS-HCC) 12/31/2013    H/O total knee replacement 12/31/2013    SLE (systemic lupus erythematosus)    (CMS-HCC) 11/28/2013    Episcleritis 09/26/2013    Other diseases of vocal cords 05/28/2013    Vocal cord dysfunction 05/28/2013    Generalized anxiety disorder 03/19/2013    Pain medication agreement signed 02/12/2013    Family history of breast cancer     VIN II (vulvar intraepithelial neoplasia II) 01/25/2013    Back pain 12/18/2012    Multiple pulmonary nodules 11/26/2012    Lung nodule, multiple 11/26/2012    Pulmonary nodules 11/26/2012    Osteoarthrosis 11/01/2012    Osteoarthritis 11/01/2012    Arthritis, degenerative 11/01/2012    Postinflammatory pulmonary fibrosis    (CMS-HCC) 10/22/2012    Aspiration pneumonitis    (CMS-HCC) 10/22/2012    H/O aspiration pneumonitis 10/22/2012    Mixed urge and stress incontinence 05/04/2012    Dysphonia 03/22/2012    Sensorineural hearing loss 03/22/2012    Myalgia and myositis 02/25/2011    Benign neoplasm of pituitary gland and craniopharyngeal duct (pouch) (CMS-HCC) 12/29/2010    Chronic kidney disease, stage I 12/08/2010    Proteinuria 12/08/2010    Lung involvement in systemic lupus erythematosus    (CMS-HCC) 11/05/2010    Chronic nausea 07/01/2008    Hypertension, benign 01/25/2005     Allergies:   ??  Allergies   Allergen Reactions    Vilazodone Swelling    Ace Inhibitors Other (See Comments)     Pt states she can not take ace inhibitors. Pt states she can not remember her reaction.     Bee Pollen Itching     COUGH, WATERY EYES    Penicillin G Rash    Penicillins Rash    Pollen Extracts Itching     COUGHING, WATERY EYES     Current Outpatient Medications:  ??  Current Outpatient Medications on File Prior to Visit   Medication Sig  Dispense Refill    albuterol  HFA 90 mcg/actuation inhaler Inhale 2 puffs every six (6) hours as needed for wheezing. 8 g 11    amlodipine  (NORVASC ) 2.5 MG tablet Take 3 tablets (7.5 mg total) by mouth daily. 90 tablet 3    atenolol  (TENORMIN ) 25 MG tablet Take 1.5 tablets (37.5 mg total) by mouth two (2) times a day. 300 tablet 3    atorvastatin (LIPITOR) 40 MG tablet Take 1 tablet (40 mg total) by mouth daily.      beclomethasone dipropionate  (QVAR  REDIHALER) 80 mcg/actuation inhaler Inhale 2 puffs in the morning and 2 puffs in the evening. 10.6 g 2    biotin 1 mg cap Take by mouth.      buPROPion  (WELLBUTRIN  XL) 300 MG 24 hr tablet Take 1 tablet (300 mg total) by mouth daily. 90 tablet 0    carboxymethylcellulose sodium (ARTIFICIAL TEARS, [facility],) 1 % Drop Apply to eye.      clonazePAM  (KLONOPIN ) 0.5 MG tablet Take 0.5 tablets (0.25 mg total) by mouth nightly as needed for anxiety. 15 tablet 1    clotrimazole -betamethasone  (LOTRISONE ) 1-0.05 % cream Apply 1 Application topically two (2) times a day.      cyclobenzaprine  (FLEXERIL ) 5 MG tablet Take 1 tablet (5 mg total) by mouth two (2) times a day as needed for muscle spasms. 120 tablet 1    diclofenac  sodium (VOLTAREN ) 1 % gel Apply 2 g topically two (2) times a day as needed for arthritis. 300 g 5    dicyclomine (BENTYL) 20 mg tablet Take 1 tablet (20 mg total) by mouth daily as needed.      famotidine  (PEPCID ) 20 MG tablet TAKE 1 TABLET BY MOUTH TWICE  DAILY 200 tablet 2    fluticasone  propionate (FLONASE ) 50 mcg/actuation nasal spray       hydrALAZINE  (APRESOLINE ) 10 MG tablet       hydroxychloroquine  (PLAQUENIL ) 200 mg tablet Take 1 tablet (200 mg total) by mouth two (2) times a day. 180 tablet 3    inhalational spacing device (AEROCHAMBER MV) Spcr 1 each by Miscellaneous route two (2) times a day. With Fovent 1 each 0    lamoTRIgine  (LAMICTAL ) 200 MG tablet Take 1 tablet (200 mg total) by mouth nightly. 90 tablet 0    lidocaine  (XYLOCAINE ) 5 % ointment Apply topically four (4) times a day. 480 g 0    linaclotide  (LINZESS ) 290 mcg capsule TAKE 1 CAPSULE BY MOUTH DAILY. 30 capsule 3    loratadine (CLARITIN) 10 mg tablet Take 1 tablet (10 mg total) by mouth daily.      melatonin 10 mg Tab Take 1 tablet by mouth.      metFORMIN (GLUCOPHAGE-XR) 500 MG 24 hr tablet Take 1 tablet (500 mg total) by mouth daily before breakfast.      mycophenolate  (MYFORTIC ) 360 MG TbEC Take 2 tablets (720 mg total) by mouth two (2) times a day. 360 tablet 3    omeprazole  (PRILOSEC) 40 MG capsule TAKE 1 CAPSULE BY MOUTH TWO TIMES A DAY. 180 capsule 3    ondansetron  (ZOFRAN ) 4 MG tablet       polyethylene glycol (GLYCOLAX ) 17 gram/dose powder Take 17 g by mouth daily.      pregabalin  (LYRICA ) 75 MG capsule TAKE 1 CAPSULE (75 MG TOTAL) BY MOUTH TWO (2) TIMES A DAY. 120 capsule 0    telmisartan  (MICARDIS ) 80 MG tablet Take 1 tablet (80 mg total) by mouth daily. 90 tablet 3  traZODone  (DESYREL ) 100 MG tablet Take 1.5 tablets (150 mg total) by mouth nightly. 135 tablet 0     No current facility-administered medications on file prior to visit.     ?  ??  PHYSICAL EXAM?  Vitals:    06/05/24 0816   BP: 107/69   BP Site: L Arm   BP Position: Sitting   BP Cuff Size: Large   Pulse: 78   Temp: 36.3 ??C (97.3 ??F) TempSrc: Temporal   Weight: 85.7 kg (189 lb)   Height: 170.2 cm (5' 7)     General:   Pleasant 68 y.o.female in no acute distress, WDWN   Cardiovascular:  Regular rate and rhythm. No murmur, rub, or gallop. No lower extremity edema.    Lungs:  Clear to auscultation.Normal respiratory effort.    Musculoskeletal:   General: Ambulates w/o assistance   Hands: No swelling or tenderness. Triggering R 3rd finger. Able to make a tight fist b/l   Wrists:FROM w/o swelling.   Elbows: FROM w/o swelling.  Shoulders: FROM w/ pain   Knees: FROM w/o effusions. Surgical scar on the R.   Ankles: No swelling.    Psych:  Appropriate affect and mood   Skin:  Hypopigmented lesions under abdominal pannus w/o erythema.         Assessment/Plan:   1. Systemic lupus erythematosus with pulmonary fibrosis   SLE clinically stable today. Continue HCQ 200 mg BID, Myfortic  720 mg twice daily.  Should continue f/u with pulmonology to determine if immunosuppression change is needed for her lung disease.   Check monitoring labs below.   - Anti-DNA antibody, double-stranded  - BUN  - C3 complement  - C4 complement  - CBC w/ Differential  - Creatinine  - Protein/Creatinine Ratio, Urine  - Urinalysis with Microscopy with Culture Reflex  - Urine Culture    2. Fibromyalgia  Symptomatic but stable. Continue f/u with pain clinic.     3. Inverse psoriasis  Worsening itching of skin, ?psoriasis. She was given the phone number to the dermatology clinic and recommend she call to make an appt.     4. Trigger finger  She was given the phone number for the orthopedics clinic. Asked her to call and make appt to f/u on treatment options for this.     5. On mycophenolate  mofetil therapy  Checking monitoring labs         HCM:   - PCV13 Status: 05/26/2016  - PPSV 23 Status: 08/04/2016  - PCV20: 09/30/21  - Annual Influenza vaccine. Status: 06/05/24  -COVID-19 vaccine:recommend updating   - Bone health: Dexa 04/2022 with normal BMD.    - Plaquenil  eye exam: 06/2023 with [facility] ophthalmology. Given the phone number to ophthalmology to call and schedule updated testing.   - Contraception: postmenopausal        RTC 4 mo as scheduled with Dr Shary   Greater than 20 minutes spent in visit with patient, including pre and postvisit activities.

## 2024-06-05 NOTE — Unmapped (Addendum)
 Call the dermatologist and the eye doctor to make appointments. Numbers put in your phone today. You need an eye appt to test for plaquenil  toxicity. You need to discuss the itchy skin with the dermatologist.   You were also given the number for orthopedics. You can call them to get another injection for the trigger finger.

## 2024-06-17 LAB — ANTI-DNA ANTIBODY, DOUBLE-STRANDED: DSDNA ANTIBODY: NEGATIVE

## 2024-06-18 NOTE — Unmapped (Signed)
 [facility] Clinical Assessment & Refill Coordination Note    Caitlin Smith, DOB: [birth year]  Phone: [phone] (home) [phone] (work)    All above HIPAA information was verified with patient.     Was a Nurse, learning disability used for this call? No    Specialty Medication(s):   Inflammatory Disorders: mycophenolate      Current Medications[1]     Changes to medications: Caitlin Smith reports no changes at this time.    Medication list has been reviewed and updated in Epic: Yes    Allergies[2]    Changes to allergies: No    Allergies have been reviewed and updated in Epic: Yes    SPECIALTY MEDICATION ADHERENCE     mycophenolate  360 mg: 10 days of medicine on hand     Medication Adherence    Patient reported X missed doses in the last month: 0  Specialty Medication: mycophenolate  360  Patient is on additional specialty medications: No  Informant: patient          Specialty medication(s) dose(s) confirmed: Regimen is correct and unchanged.     Are there any concerns with adherence? No    Adherence counseling provided? Not needed    CLINICAL MANAGEMENT AND INTERVENTION      Clinical Benefit Assessment:    Do you feel the medicine is effective or helping your condition? Yes, working very well    Clinical Benefit counseling provided? Progress note from 06/05/24 shows evidence of clinical benefit    Adverse Effects Assessment:    Are you experiencing any side effects? No    Are you experiencing difficulty administering your medicine? No    Quality of Life Assessment:    Quality of Life    Rheumatology  Oncology  Dermatology  Cystic Fibrosis          How many days over the past month did your SLE  keep you from your normal activities? For example, brushing your teeth or getting up in the morning. 0    Have you discussed this with your provider? Not needed    Acute Infection Status:    Acute infections noted within Epic:  No active infections    Patient reported infection: None    Therapy Appropriateness:    Is the medication and dose appropriate considering the patient???s diagnosis, treatment, and disease journey, comorbidities, medical history, current medications, allergies, therapeutic goals, self-administration ability, and access barriers? Yes, therapy is appropriate and should be continued     Clinical Intervention:    Was an intervention completed as part of this clinical assessment? No    DISEASE/MEDICATION-SPECIFIC INFORMATION      N/A    Chronic Inflammatory Diseases: Have you experienced any flares in the last month? No    PATIENT SPECIFIC NEEDS     Does the patient have any physical, cognitive, or cultural barriers? No    Is the patient high risk? No    Does the patient require physician intervention or other additional services (i.e., nutrition, smoking cessation, social work)? No    Does the patient have an additional or emergency contact listed in their chart? Yes    SOCIAL DETERMINANTS OF HEALTH     At the [facility], we have learned that life circumstances - like trouble affording food, housing, utilities, or transportation can affect the health of many of our patients.   That is why we wanted to ask: are you currently experiencing any life circumstances that are negatively impacting your health  and/or quality of life? No    Social Drivers of Health     Food Insecurity: No Food Insecurity (05/17/2024)    Hunger Vital Sign     Worried About Running Out of Food in the Last Year: Never true     Ran Out of Food in the Last Year: Never true   Tobacco Use: Low Risk  (06/06/2024)    Patient History     Smoking Tobacco Use: Never     Smokeless Tobacco Use: Never     Passive Exposure: Never   Transportation Needs: No Transportation Needs (05/17/2024)    PRAPARE - Transportation     Lack of Transportation (Medical): No     Lack of Transportation (Non-Medical): No   Recent Concern: Transportation Needs - Unmet Transportation Needs (04/18/2024)    Received from [facility] - Transportation     In the past 12 months, has lack of transportation kept you from medical appointments or from getting medications?: Yes     In the past 12 months, has lack of transportation kept you from meetings, work, or from getting things needed for daily living?: Yes   Alcohol Use: Not on file   Housing: Low Risk  (05/17/2024)    Housing     Within the past 12 months, have you ever stayed: outside, in a car, in a tent, in an overnight shelter, or temporarily in someone else's home (i.e. couch-surfing)?: No     Are you worried about losing your housing?: No   Physical Activity: Insufficiently Active (05/03/2024)    Received from [facility]    Exercise Vital Sign     On average, how many days per week do you engage in moderate to strenuous exercise (like a brisk walk)?: 4 days     On average, how many minutes do you engage in exercise at this level?: 30 min   Utilities: Low Risk  (05/17/2024)    Utilities     Within the past 12 months, have you been unable to get utilities (heat, electricity) when it was really needed?: No   Stress: Stress Concern Present (05/03/2024)    Received from [facility] - Occupational Stress Questionnaire     Do you feel stress - tense, restless, nervous, or anxious, or unable to sleep at night because your mind is troubled all the time - these days?: To some extent   Interpersonal Safety: Not At Risk (05/17/2024)    Interpersonal Safety     Unsafe Where You Currently Live: No     Physically Hurt by Anyone: No     Abused by Anyone: No   Substance Use: Not on file (07/10/2023)   Intimate Partner Violence: Not At Risk (05/03/2024)    Received from [facility]    Humiliation, Afraid, Rape, and Kick questionnaire     Within the last year, have you been afraid of your partner or ex-partner?: No     Within the last year, have you been humiliated or emotionally abused in other ways by your partner or ex-partner?: No     Within the last year, have you been kicked, hit, slapped, or otherwise physically hurt by your partner or ex-partner?: No     Within the last year, have you been raped or forced to have any kind of sexual activity by your partner or ex-partner?: No   Social Connections: Moderately Integrated (05/03/2024)    Received from  [facility]    Social Connection and Isolation Panel     In a typical week, how many times do you talk on the phone with family, friends, or neighbors?: More than three times a week     How often do you get together with friends or relatives?: Once a week     How often do you attend church or religious services?: More than 4 times per year     Do you belong to any clubs or organizations such as church groups, unions, fraternal or athletic groups, or school groups?: Yes     How often do you attend meetings of the clubs or organizations you belong to?: More than 4 times per year     Are you married, widowed, divorced, separated, never married, or living with a partner?: Divorced   Physicist, medical Strain: Low Risk  (05/03/2024)    Received from [facility]    Overall Financial Resource Strain (CARDIA)     How hard is it for you to pay for the very basics like food, housing, medical care, and heating?: Not hard at all   Health Literacy: Adequate Health Literacy (05/03/2024)    Received from [facility]    B1300 Health Literacy     How often do you need to have someone help you when you read instructions, pamphlets, or other written material from your doctor or pharmacy?: Never   Internet Connectivity: Not on file       Would you be willing to receive help with any of the needs that you have identified today? Not applicable       SHIPPING     Specialty Medication(s) to be Shipped:   Inflammatory Disorders: mycophenolate     Other medication(s) to be shipped: No additional medications requested for fill at this time    Specialty Medications not needed at this time: N/A     Changes to insurance: No    Cost and Payment: Patient has a $0 copay, payment information is not required.    Delivery Scheduled: Yes, Expected medication delivery date: 10/21.     Medication will be delivered via Next Day Courier to the confirmed prescription address in [facility].    The patient will receive a drug information handout for each medication shipped and additional FDA Medication Guides as required.  Verified that patient has previously received a Conservation officer, historic buildings and a Surveyor, mining.    The patient or caregiver noted above participated in the development of this care plan and knows that they can request review of or adjustments to the care plan at any time.      All of the patient's questions and concerns have been addressed.    Rosalynn GORMAN Kin, PharmD   [facility] Specialty Pharmacist       [1]   Current Outpatient Medications   Medication Sig Dispense Refill    albuterol  HFA 90 mcg/actuation inhaler Inhale 2 puffs every six (6) hours as needed for wheezing. 8 g 11    amlodipine  (NORVASC ) 2.5 MG tablet Take 3 tablets (7.5 mg total) by mouth daily. 90 tablet 3    atenolol  (TENORMIN ) 25 MG tablet Take 1.5 tablets (37.5 mg total) by mouth two (2) times a day. 300 tablet 3    atorvastatin (LIPITOR) 40 MG tablet Take 1 tablet (40 mg total) by mouth daily.      beclomethasone dipropionate  (QVAR  REDIHALER) 80 mcg/actuation inhaler Inhale 2 puffs in the  morning and 2 puffs in the evening. 10.6 g 2    biotin 1 mg cap Take by mouth.      buPROPion  (WELLBUTRIN  XL) 300 MG 24 hr tablet Take 1 tablet (300 mg total) by mouth daily. 90 tablet 0    carboxymethylcellulose sodium (ARTIFICIAL TEARS, [facility],) 1 % Drop Apply to eye.      clonazePAM  (KLONOPIN ) 0.5 MG tablet Take 0.5 tablets (0.25 mg total) by mouth nightly as needed for anxiety. 15 tablet 1    clotrimazole -betamethasone  (LOTRISONE ) 1-0.05 % cream Apply 1 Application topically two (2) times a day.      cyclobenzaprine  (FLEXERIL ) 5 MG tablet Take 1 tablet (5 mg total) by mouth two (2) times a day as needed for muscle spasms. 120 tablet 1    diclofenac  sodium (VOLTAREN ) 1 % gel Apply 2 g topically two (2) times a day as needed for arthritis. 300 g 5    dicyclomine (BENTYL) 20 mg tablet Take 1 tablet (20 mg total) by mouth daily as needed.      famotidine  (PEPCID ) 20 MG tablet TAKE 1 TABLET BY MOUTH TWICE  DAILY 200 tablet 2    fluticasone  propionate (FLONASE ) 50 mcg/actuation nasal spray       hydrALAZINE  (APRESOLINE ) 10 MG tablet       hydroxychloroquine  (PLAQUENIL ) 200 mg tablet Take 1 tablet (200 mg total) by mouth two (2) times a day. 180 tablet 3    inhalational spacing device (AEROCHAMBER MV) Spcr 1 each by Miscellaneous route two (2) times a day. With Fovent 1 each 0    lamoTRIgine  (LAMICTAL ) 200 MG tablet Take 1 tablet (200 mg total) by mouth nightly. 90 tablet 0    lidocaine  (XYLOCAINE ) 5 % ointment Apply topically four (4) times a day. 480 g 0    linaclotide  (LINZESS ) 290 mcg capsule TAKE 1 CAPSULE BY MOUTH DAILY. 30 capsule 3    loratadine (CLARITIN) 10 mg tablet Take 1 tablet (10 mg total) by mouth daily.      melatonin 10 mg Tab Take 1 tablet by mouth.      metFORMIN (GLUCOPHAGE-XR) 500 MG 24 hr tablet Take 1 tablet (500 mg total) by mouth daily before breakfast.      mycophenolate  (MYFORTIC ) 360 MG TbEC Take 2 tablets (720 mg total) by mouth two (2) times a day. 360 tablet 3    omeprazole  (PRILOSEC) 40 MG capsule TAKE 1 CAPSULE BY MOUTH TWO TIMES A DAY. 180 capsule 3    ondansetron  (ZOFRAN ) 4 MG tablet       polyethylene glycol (GLYCOLAX ) 17 gram/dose powder Take 17 g by mouth daily.      pregabalin  (LYRICA ) 75 MG capsule TAKE 1 CAPSULE (75 MG TOTAL) BY MOUTH TWO (2) TIMES A DAY. 120 capsule 0    telmisartan  (MICARDIS ) 80 MG tablet Take 1 tablet (80 mg total) by mouth daily. 90 tablet 3    traZODone  (DESYREL ) 100 MG tablet Take 1.5 tablets (150 mg total) by mouth nightly. 135 tablet 0     No current facility-administered medications for this visit.   [2]   Allergies  Allergen Reactions    Vilazodone Swelling    Ace Inhibitors Other (See Comments)     Pt states she can not take ace inhibitors. Pt states she can not remember her reaction.     Bee Pollen Itching     COUGH, WATERY EYES    Penicillin G Rash    Penicillins Rash    Pollen Extracts  Itching     COUGHING, WATERY EYES

## 2024-06-23 MED FILL — MYCOPHENOLATE SODIUM 360 MG TABLET,DELAYED RELEASE: ORAL | 30 days supply | Qty: 120 | Fill #1

## 2024-06-24 ENCOUNTER — Other Ambulatory Visit: Payer: Self-pay | Admitting: Family Medicine

## 2024-06-24 ENCOUNTER — Encounter
Admit: 2024-06-24 | Discharge: 2024-06-24 | Payer: Medicare (Managed Care) | Attending: Student in an Organized Health Care Education/Training Program | Primary: Student in an Organized Health Care Education/Training Program

## 2024-06-24 ENCOUNTER — Ambulatory Visit
Admit: 2024-06-24 | Discharge: 2024-06-24 | Payer: Medicare (Managed Care) | Attending: Psychologist | Primary: Psychologist

## 2024-06-24 DIAGNOSIS — R0609 Other forms of dyspnea: Principal | ICD-10-CM

## 2024-06-24 DIAGNOSIS — J8489 Other specified interstitial pulmonary diseases: Principal | ICD-10-CM

## 2024-06-24 MED ORDER — AIRSUPRA 90 MCG-80 MCG/ACTUATION HFA AEROSOL INHALER
RESPIRATORY_TRACT | 2 refills | 0.00000 days | Status: CP | PRN
Start: 2024-06-24 — End: ?

## 2024-06-24 NOTE — Unmapped (Addendum)
 It was a pleasure seeing you today! You were seen in clinic today by Dr. Guido Bullock & Dr. Violette      Here's a summary of what we discussed during your visit:  Please call to schedule your sleep study - [phone]. This will connect you to the [facility] Sleep lab. Even though I have placed the referral to the Sleep Lab, you need to call to set up a sleep study. The sleep study will tell us  if you have sleep apnea or not. If you are diagnosed with sleep apnea, then we will need a SECOND study to have you set up with a CPAP machine (to help determine what settings you need).  STOP qvar , start Airsupra or symbicort (whichever is covered by insurance) 2 puffs every 4 hours as NEEDED.   Keep up the great work with pulmonary rehab  Keep up the good work with using your inhalers and exercising      Follow up with me in 3 months!    Guido Bullock, MD  Pulmonary & Critical Care Fellow  [facility]  Phone: [phone]  Fax: [phone]    To contact your care team, you can either send a message via MyChart or contact the clinic at [phone].    How do I request medication refills?  Request a refill via [facility] (patient portal), call clinic at [phone] or have your pharmacy fax the request to [phone].    [facility]: [phone] *Pharmacy can mail medications to your home. You must call to request the medication be mailed.[facility]: [phone]  [facility]: [phone]    Here are some things you should know about contacting the [facility]:  Please be advised Epic now releases test results to MyChart as soon as they are available which means you will see your test results before I do.    The best way to reach your doctor for non-urgent matters is through MyChart. I can usually respond within two to three business day but I do not check messages after hours (evenings, weekends, and holidays) and often have other duties (inpatient hospital work, Producer, television/film/video activities, teaching) that make me unavailable.    - If you have sent a MyChart message to the clinic and have not received a response after three business days, please call our clinic at [phone] to speak to a nurse.     If you have an urgent issue that you feel needs a response the same day, you should also contact your primary care provider or be evaluated at an Urgent Care clinic.    For urgent lung issues after hours, you can call the hospital operator [phone]) and ask for the Pulmonary Fellow On Call. This doctor can provide some guidance and will send a message to your regular lung doctor the next morning.    If there is an emergency, call 911 or go to your closest emergency room.    I don't have a MyChart. Why should I get one?   - It's encrypted, so your information is secure  - It's a quick, easy way to contact the care team, manage appointments, see test results, and more!    How do I sign-up for MyChart?   - Download the MyChart app from the [employer] or [employer] and sign-up in the app  - Sign-up online at [email or web address]

## 2024-06-24 NOTE — Unmapped (Signed)
 problemPulmonary Clinic - Follow-up Virtual Visit      HISTORY:     Active Pulmonary Problems & Brief History:  Caitlin Smith is a 68 y.o. woman with many medical issues here for follow up of follow up for organizing pneumonia.     Interval History:  - 2016 and prior - patient followed at [facility]. They thought her symptoms of dyspnea were from obesity and asthma and recommended ENT evaluation. They also stated concern for chronic aspiration.   - 04/2016 - patient in [facility] system. She was thought to have multifactorial dyspnea, waxing and waning pulmonary opacities. Evaluated by pulmonary hypertension team without evidence of pulmonary therapies  Risk reinforced today therapies are breast tree hypertension. Underwent bronchoscopy with BAL and transbronchial biopsies which showed significant inflammation. DDX remained broad - CEP, COP, SLE pneumonitis, ABPA, EGPA, AEP, but it was decided to treat her with prolonged steroid taper.   - 08/23/16 - last visit with Dr. Dorthy - was improving with steroid taper   - 10/20/16 - established with Dr. Lari - there had been question of compliance with her prednisone  since last visit, but by time of visit she had been on it consistently for one month. No clear improvement in dyspnea, but remains inactive.   - 02/09/17 - follow up - spirometry showed improvement in FVC and DLCOafter 2 months of steroid, had tapered off by time of visit. When she stopped the prednisone  she developed increased cough, chest tightness, dyspnea.   - 03/27/17 - follow up - given steroid dependence was placed on MMF.   - 10/16/17 - follow up - dose reduced MMF for GI side effects. Prolonged steroid taper over this time.   - 02/06/2019 - last visit with pulmonary, Dr. Annella - at that time had gone to local ED for chest pain, CT chest showed interstitial lung disease in the lung bases, favored to reflect mild progression of chronic fibrotic nonspecific interstitial pneumonia (NSIP). It was unclear to Dr. Annella if that represented actual progression of her underlying disease.   - 03/03/21 - initial visit with me - DOE w/o chest pain. No inhalers. Chronic dry cough thought to be from reflux.  - 06/08/21 - ENT - larynx healthy on laryngoscopy. Referred to GI for GI discomfort and constipation.   - 07/04/21 - follow up - exercise increased to 50 minutes daily, acute worsening 2 weeks prior, evaluation with CT chest, echocardiogram, unremarkable. Chest fluoroscopy with decreased, but normal, movement of left hemidiaphragm.   - 09/13/20 - follow up - had URI over Christmas and New Year treat with doxycycline and followed with lingering hoarseness, productive cough. Increased MMF.   - 12/29/21 - follow up - very easily dyspneic, even just walking to the car or climbing 12 stairs into house. Less active overall. Not as much coughing, allergies are improved. No heartburn with acid reflux medication.   - 02/16/22 - phone visit - PFTs improved from April, continued pred 10mg  daily, MMF 720 BID, HCQ 200mg  BID.   - 04/20/22 - follow up - some palpitations, some dyspnea, some dry cough associated with nausea and dizziness. No clubbing or LE edema. MMF 720mg  BID, HCQ 200mg  BID, prednisone  7.5mg  daily.   - 06/09/22 - ENT - PPI ineffective for cough.Trial of right SLN injection for neurogenic cough.  - 09/12/21 - follow up - doing pulmonary rehab, exercising 5 days a week, walking an hour a day, significant cough worst at night. No changes to treatment.   -  10/19/22 - follow up - nerve injection significantly helped cough. Exercising without difficulty. DOE climbing stairs.   - 01/25/23 - follow up - significant anxiety about unintentional 40 pound weight loss. Some dyspnea with faster walking, climbing stairs. Still ongoing cough that returned a month after the injection. Is able to walk an hour every morning, 5 days per week. Taking 5mg  daily of prednisone  (at night), mycophenolate  720mg  BID, HCQ 200mg  BID. Recommended to taper prednisone  very slowly over the next few months, continue MMF/HCQ  - 06/26/23 - follow-up. Was doing well walking 5x/wk, then had to cut back to 3x/wk. She gets dyspneic with walking too long. She finds it hard to talk for long periods of time. She is on prednisone  1mg  currently. She is not sure that tapering the prednisone  made her more dyspneic. Having to pace herself. She has been on prednisone  for many years - on varying doses. Cough stable, no fevers. Hoarseness has come back some  - did an injection with ENT. Dry cough. Normally does not use albuterol  but now using it more often though it does not seem to work. She has an old flovent  rx that she has been using which used to help, but no longer. She does check her O2 number at home- gets down to 90 with walking. She has done pulmonary rehab in the past at [facility]. She is overwhelmed by the # of appts. Would like virtual visit next time. Tearful today.   - 09/10/23 - video visit follow up - She is feeling under the weather today and having some back pain. Lungs not bothering her much right now. Reassured with breathing tests looking better. Her back pain is limiting her exercise right now so she is not walking very much. She stopped the prednisone  and is on myfortic  and breathing overall seems better. She is not sure why but wonders if it is related to exercising and eating healthier. She is also doing some breathing exercises. She does still have some chest pains that bother her - happens for a few seconds, 1-2x/day or sometimes not at all, like a nagging toothache, non-exertional, never lasts for more than a few seconds, not related to meals. The fluttering sensation in her chest has resolved with atenolol . Taking her GERD medicine. No pain with inspiration, no cough. Seeing ENT ([place]meier) because she is having a dry cough to see if she needs injections again - dry cough with hoarseness, not a lot of phlegm. Currently under the weather (worsened cough) but says this has been this way for some time and her last injection with ENT resolved her symptoms.  - 02/14/24: video visit follow-up. Saw ENT Feb, diagnosed with LPR, started ST. She reports she still gets SOB sometimes, but walking. Not as bad as she has been in the past. Still walking lots. She coughs a lot- was referred to an SLP for cough therapy, has not 'conquered' it yet and coughs all day. She thinks cough is not much better with speech therapy and worries it is coming from her lungs. She is not more SOB than usual.   - 7/16 - LWBS; PFTs stable.   - 06/24/24: Video visit. She is doing well. She is doing pulmonary rehab MWF but she is worried as she cannot get the incentive spirometer past . She is doing breathing exercises. She feels like pulmonary rehab is catered more to COPD than her condition but she is trying to go along with the program. She is walking on a  daily basis.She wants to know how to get her lung volumes up! She feels like the SOB is about the same. She tries to walk an hour per day and has to stop a few times in there to catch her breath but this is largely unchanged. Re: OSA concerns, does have daytime headaches as well as fatigue/somnolence. Re: Qvar , she is taking it when she needs it. She has no issues with thrush. She is doing Kivo. Just still like DOE.     Past Medical History: The medical and surgical history were personally reviewed and updated in the patient's electronic medical record. Pertinent positives are documented above.  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 Pertinent positives are documented above.  Home Medications: Medications were reviewed and updated in the patient's electronic medical record. Pertinent positives are documented above.   Allergies: Allergies were reviewed and updated in the patient's electronic medical record. Pertinent positives are documented above.  Review of Systems: A comprehensive review of systems was completed and negative except as noted in HPI.    PHYSICAL EXAM:     VIDEO VISIT  General: alert. Pleasant, sitting in living room, conversing in complete sentences. Has her home pulse oximeter with HR in the 60s. SpO2 seems to vary widely 75-90 and unclear if an accurate measurement.     LABORATORY and RADIOLOGY DATA:     Pulmonary Function Tests/Interpretation:      08/03/22      - HR inappropriately low with exercise  (resting 59, peak 84)    Pertinent Laboratory Data:  02/08/22 - Hg 13.4, absolute lymphocyte 3.8    04/25/16 BAL and TBBX   Cultures negative except for OPF     Cell count  PMN 31%  Lymph 11%  Macs 43%  Eos 7%  Baso 6%     Pathology   A: Lung, right lower lobe, endobronchial biopsy   - DIP-like reaction pattern with admixed eosinophils, consistent with eosinophilic pneumonia (see Micro Exam)  - Patchy organizing pneumonia  - Interstitial lymphoplasmacytic infiltrate, polytypic  - Partially denuded benign respiratory mucosa, without viral cytopathic effect  - No malignant neoplasm identified   - GMS and  AFB stains negative for fungi or AFB    Cytology  Lung, Bronchial lavage:  - No malignant cells identified.   - Alveolar macrophages and mixed inflammation.     Pertinent Imaging Data:  07/05/23: Stable appearance of fibrotic changes related to interstitial lung disease associated with connective tissue disease.     09/15/2022 Barium Swallow Double Contrast   Esophageal dysmotility.   Gastroesophageal reflux.   Elevation of the left hemidiaphragm.       06/29/22 CXR  Hypoinflated lungs with no acute abnormalities.     08/02/21 HRCT Chest  Slight worsening of fibrotic changes related to  interstitial lung disease associated with connective tissue disease.       Resolution of right lower lobe  consolidation with central clearance consistent with organizing pneumonia.     08/26/21 Sniff Test  Elevated left hemidiaphragm with slightly diminished excursion relative to the right but no diaphragmatic paralysis.    Pertinent Cardiac Data:  05/26/22 Zio   Patient had a min HR of 52 bpm, max HR of 123 bpm, and avg HR of 67 bpm. Predominant underlying rhythm was Sinus Rhythm. Isolated SVEs were rare (<1.0%), and no SVE Couplets or SVE Triplets were present. Isolated VEs were rare (<1.0%), and no VE Couplets or VE Triplets were present.   Symptoms were associated with sinus rhythm and also isolated VEs.     08/27/21 TTE     1. The left ventricular systolic function is normal, LVEF is visually estimated at 60-65%.    2. The right ventricle is normal in size, with normal systolic function.    3. The pulmonary systolic pressure cannot be estimated due to insufficient TR signal.      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 Shary) who has been managed over the last several years on prednisone , mycophenolate , and hydroxychloroquine . She was weaned off of prednisone  06/2023 in the setting of inverse psoriasis and to prevent other complications. Recent monitoring labs with rheumatology were within normal limits.     In 2024, she was quite dyspneic with exertion and fatigued; we did a relatively broad workup that showed overall stable mild fibrotic changes in her lungs via HRCT, improving PFTs (really overall looking progressively better since 12/2021), echo G1DD but no e/o PE. Her did not demonstrate any need for supplemental O2 but she did have an inappropriately low increase in her HR (59->80s with exertion). Her DOE improved over the course of 2024 (maybe with some dietary/exercise changes) but remains persistent despite engagement in pulmonary rehab. She has sxs concerning for OSA so will pursue PSG. If this is all optimized, would consider CPET as the next diagnostic study.     Re: cough, using Qvar  PRN. We will move to airsupra vs symbicort (pending insurance coverage) for possible component of asthma/post-inflammatory syndrome RAD.     Plan  - Continue mycophenolate  720mg  BID, HCQ 200mg  BID per rheum. Off prednisone .   - In person visit in 3 months, PFTs (FVL, DLCO) to ensure stable  - switch Qvar  to Symbicort for SMART dosing  - polysomnogram     The patient was seen with Dr. Violette and will return in 3 months    Guido LOISE Bullock, MD  Pulmonary and Critical Care Fellow

## 2024-06-25 ENCOUNTER — Ambulatory Visit: Payer: Self-pay

## 2024-06-25 ENCOUNTER — Encounter: Payer: Self-pay | Admitting: Family Medicine

## 2024-06-25 ENCOUNTER — Ambulatory Visit (INDEPENDENT_AMBULATORY_CARE_PROVIDER_SITE_OTHER): Admitting: Family Medicine

## 2024-06-25 VITALS — BP 122/66 | HR 75 | Temp 98.5°F | Ht 67.0 in | Wt 185.5 lb

## 2024-06-25 DIAGNOSIS — N3001 Acute cystitis with hematuria: Secondary | ICD-10-CM

## 2024-06-25 DIAGNOSIS — R399 Unspecified symptoms and signs involving the genitourinary system: Secondary | ICD-10-CM

## 2024-06-25 LAB — POCT URINALYSIS DIPSTICK
Glucose, UA: NEGATIVE
Ketones, UA: NEGATIVE
Nitrite, UA: NEGATIVE
Protein, UA: POSITIVE — AB
Spec Grav, UA: 1.02 (ref 1.010–1.025)
Urobilinogen, UA: 0.2 U/dL
pH, UA: 6 (ref 5.0–8.0)

## 2024-06-25 MED ORDER — SULFAMETHOXAZOLE-TRIMETHOPRIM 800-160 MG PO TABS: 1.0000 | ORAL_TABLET | Freq: Two times a day (BID) | ORAL | 0 refills | Status: AC

## 2024-06-25 NOTE — Progress Notes (Signed)
 Established patient visit   Patient: Nicole Ballard   DOB: Oct 02, 1955   68 y.o. Female  MRN: [id number] Visit Date: 06/25/2024  Today's healthcare provider: LAURAINE LOISE BUOY, DO   Chief Complaint  Patient presents with   Acute Visit    Urinary urgency, frequent urge, cramping off and on, x 2 days & some incontinence and feeling nauseous.   Subjective    HPI Nicole Ballard is a 68 year old female who presents with urinary frequency, urgency, and nausea.  She experiences urinary frequency and urgency, with a frequent urge to urinate and occasional incontinence. She describes cramping similar to premenstrual cramps and notes feeling nauseous without vomiting. She recalls having similar symptoms during a previous urinary tract infection associated with E. coli, which occurred either earlier this year or last year.  No fever, chills, or pain during urination. Sometimes she feels the urge to urinate but is unable to produce urine. There is no reported decrease in urine volume when able to urinate.      Medications: Outpatient Medications Prior to Visit  Medication Sig   albuterol  (VENTOLIN  HFA) 108 (90 Base) MCG/ACT inhaler SMARTSIG:2 Puff(s) By Mouth Every 6 Hours PRN   amLODipine  (NORVASC ) 2.5 MG tablet Take 3 tablets (7.5 mg total) by mouth daily.   aspirin  EC 81 MG tablet Take 1 tablet (81 mg total) by mouth daily.   atenolol  (TENORMIN ) 25 MG tablet Take 1 tablet by mouth in the morning and at bedtime.   atorvastatin  (LIPITOR) 40 MG tablet TAKE 1 TABLET BY MOUTH DAILY   Biotin  1 MG CAPS Take 1 capsule by mouth daily.   buPROPion  (WELLBUTRIN  XL) 300 MG 24 hr tablet Take 300 mg by mouth in the morning.   Cholecalciferol (VITAMIN D ) 2000 UNITS CAPS Take 1 capsule by mouth daily.   clobetasol ointment (TEMOVATE) 0.05 % Apply 1 application topically 2 (two) times daily.   clonazePAM  (KLONOPIN ) 0.5 MG tablet Take 0.25 mg by mouth at bedtime.   cyclobenzaprine  (FLEXERIL ) 5 MG  tablet Take by mouth.   diclofenac  Sodium (VOLTAREN ) 1 % GEL Apply topically daily as needed.   famotidine (PEPCID) 20 MG tablet Take 20 mg by mouth daily.   fluticasone  (FLONASE ) 50 MCG/ACT nasal spray Place 2 sprays into both nostrils daily.   hydrALAZINE  (APRESOLINE ) 25 MG tablet Take 1 tablet (25 mg total) by mouth 4 (four) times daily as needed (For sBP>150 and dBP>90).   hydroxychloroquine (PLAQUENIL) 200 MG tablet Take by mouth 2 (two) times daily.   hydrOXYzine  (ATARAX ) 10 MG tablet Take 1 tablet (10 mg total) by mouth 3 (three) times daily as needed.   lamoTRIgine  (LAMICTAL ) 200 MG tablet Take 200 mg by mouth at bedtime.   lidocaine  (XYLOCAINE ) 5 % ointment Apply 1 Application topically 4 (four) times daily as needed.   LINZESS  145 MCG CAPS capsule Take 145 mcg by mouth daily.   loratadine  (CLARITIN ) 10 MG tablet Take 1 tablet (10 mg total) by mouth daily.   Melatonin 10 MG TABS Take 1 tablet by mouth at bedtime.   metFORMIN  (GLUCOPHAGE -XR) 500 MG 24 hr tablet TAKE 1 TABLET BY MOUTH DAILY  WITH BREAKFAST   mycophenolate  (MYFORTIC) 360 MG TBEC EC tablet Take 720 mg by mouth 2 (two) times daily.   olopatadine  (PATANOL) 0.1 % ophthalmic solution Place 1 drop into both eyes 2 (two) times daily.   omeprazole  (PRILOSEC) 40 MG capsule Take 1 capsule by mouth in the morning  and at bedtime. Pulmonologist   ondansetron  (ZOFRAN ) 4 MG tablet Take 1 tablet (4 mg total) by mouth every 8 (eight) hours as needed for nausea or vomiting.   pregabalin (LYRICA) 75 MG capsule Take 75 mg by mouth 2 (two) times daily.   QVAR REDIHALER 80 MCG/ACT inhaler Inhale 2 puffs into the lungs.   Spacer/Aero-Holding Chambers (AEROCHAMBER MV) inhaler 1 each by Other route.   telmisartan  (MICARDIS ) 80 MG tablet TAKE 1 TABLET BY MOUTH DAILY   traZODone (DESYREL) 100 MG tablet Take 150 mg by mouth at bedtime.   triamcinolone  ointment (KENALOG ) 0.1 %    No facility-administered medications prior to visit.    Review of  Systems  Constitutional:  Negative for chills and fever.  Gastrointestinal:  Positive for abdominal pain (Suprapubic cramping) and nausea. Negative for vomiting.  Genitourinary:  Positive for frequency and urgency. Negative for decreased urine volume, difficulty urinating and dysuria.  Neurological:  Positive for headaches. Negative for dizziness and light-headedness.        Objective    BP 122/66 (BP Location: Left Arm, Patient Position: Sitting, Cuff Size: Normal)   Pulse 75   Temp 98.5 F (36.9 C) (Oral)   Ht 5' 7 (1.702 m)   Wt 185 lb 8 oz (84.1 kg)   SpO2 98%   BMI 29.05 kg/m     Physical Exam Constitutional:      Appearance: Normal appearance.  HENT:     Head: Normocephalic and atraumatic.  Eyes:     General: No scleral icterus.    Extraocular Movements: Extraocular movements intact.     Conjunctiva/sclera: Conjunctivae normal.  Cardiovascular:     Rate and Rhythm: Normal rate. Rhythm irregular.     Pulses: Normal pulses.     Heart sounds: Murmur heard.  Pulmonary:     Effort: Pulmonary effort is normal. No respiratory distress.     Breath sounds: Normal breath sounds.  Abdominal:     General: Bowel sounds are normal. There is no distension.     Palpations: Abdomen is soft. There is no mass.     Tenderness: There is abdominal tenderness. There is no right CVA tenderness (suprapubic), left CVA tenderness or guarding.  Musculoskeletal:     Right lower leg: No edema.     Left lower leg: No edema.  Skin:    General: Skin is warm and dry.  Neurological:     Mental Status: She is alert and oriented to person, place, and time. Mental status is at baseline.  Psychiatric:        Mood and Affect: Mood normal.        Behavior: Behavior normal.      Results for orders placed or performed in visit on 06/25/24  POCT Urinalysis Dipstick  Result Value Ref Range   Color, UA Yellow    Clarity, UA Clear    Glucose, UA Negative Negative   Bilirubin, UA Small     Ketones, UA Negative    Spec Grav, UA 1.020 1.010 - 1.025   Blood, UA Moderate    pH, UA 6.0 5.0 - 8.0   Protein, UA Positive (A) Negative   Urobilinogen, UA 0.2 0.2 or 1.0 E.U./dL   Nitrite, UA Negative    Leukocytes, UA Moderate (2+) (A) Negative   Appearance     Odor      Assessment & Plan    Urinary symptom or sign -     POCT urinalysis dipstick  Acute cystitis with  hematuria -     Urine Culture -     Sulfamethoxazole-Trimethoprim; Take 1 tablet by mouth 2 (two) times daily for 3 days.  Dispense: 6 tablet; Refill: 0     Urinary symptom or sign; Acute cystitis with hematuria Symptoms indicate a UTI without systemic involvement.  Point-of-care urinalysis showed trace protein, moderate blood, moderate leukocytes and negative nitrites.  No frequent UTIs in past year. - Prescribed three-day antibiotic course. - Advised increased fluid intake. - Recommended Greek yogurt at midday to support gut flora during antibiotics. - Discussed preventive measures like cranberry extract and D-mannose if UTIs become frequent.     Return if symptoms worsen or fail to improve.      I discussed the assessment and treatment plan with the patient  The patient was provided an opportunity to ask questions and all were answered. The patient agreed with the plan and demonstrated an understanding of the instructions.   The patient was advised to call back or seek an in-person evaluation if the symptoms worsen or if the condition fails to improve as anticipated.    LAURAINE LOISE BUOY, DO  [facility] [phone] (phone) [phone] (fax)  [facility]

## 2024-06-25 NOTE — Patient Instructions (Signed)
 Can consider taking cranberry and/or D-mannose for urinary tract infection pevention; as we discussed, I would only recommend this if your UTIs become more frequent.

## 2024-06-25 NOTE — Telephone Encounter (Signed)
 Patient would like to be seen today if possible so appointment made for today 06/25/2024 at [facility] at 1:20pm with Lauraine Buoy, DO.   FYI Only or Action Required?: FYI only for provider.  Patient was last seen in primary care on 05/03/2024 by Glenard Mire, MD.  Called Nurse Triage reporting Urinary Incontinence and Urinary Urgency.  Symptoms began several days ago.  Interventions attempted: Nothing.  Symptoms are: gradually worsening.  Triage Disposition: See Physician Within 24 Hours  Patient/caregiver understands and will follow disposition?: Yes---appt scheduled at [facility] today                  Copied from CRM [id number]. Topic: Clinical - Red Word Triage >> Jun 25, 2024  8:08 AM Pinkey ORN wrote: Red Word that prompted transfer to Nurse Triage: Inability To Urinate >> Jun 25, 2024  8:09 AM Pinkey ORN wrote: Patient states she's experiencing this pain in her stomach, as well as the inability to urinate. Patient states when she does go, it's not much and with the small amount she is able to put out there's blood with it.  Reason for Disposition  Urinating more frequently than usual (i.e., frequency) OR new-onset of the feeling of an urgent need to urinate (i.e., urgency)  Answer Assessment - Initial Assessment Questions 2 days ago--inontinence-would go to bathroom and be wet, some nausea Cramping started yesterday --felt like period starting per patient Saw some light blood when wiping with tissue---unsure if this was from her vagina or in her urine Patient states she has had a UTI in the past with similar symptoms Patient is advised to call us  back if anything changes or with any further questions/concerns. Patient is advised that if anything worsens to go to the Emergency Room. Patient verbalized understanding.    1. SYMPTOM: What's the main symptom you're concerned about? (e.g., frequency, incontinence)      urinary 2. ONSET: When did the  ------  start?     2 days ago 3. PAIN: Is there any pain? If Yes, ask: How bad is it? (Scale: 1-10; mild, moderate, severe)     5  mild cramping per patient 4. CAUSE: What do you think is causing the symptoms?     Unsure--had a UTI in the past like this 5. OTHER SYMPTOMS: Do you have any other symptoms? (e.g., blood in urine, fever, flank pain, pain with urination)     Saw a light pink of possible blood when she wiped  Protocols used: Urinary Symptoms-A-AH

## 2024-06-27 DIAGNOSIS — M797 Fibromyalgia: Principal | ICD-10-CM

## 2024-06-27 DIAGNOSIS — G894 Chronic pain syndrome: Principal | ICD-10-CM

## 2024-06-27 MED ORDER — PREGABALIN 75 MG CAPSULE
ORAL_CAPSULE | Freq: Two times a day (BID) | ORAL | 0 refills | 0.00000 days
Start: 2024-06-27 — End: ?

## 2024-06-28 ENCOUNTER — Ambulatory Visit: Payer: Self-pay

## 2024-06-28 LAB — URINE CULTURE: Organism ID, Bacteria: NO GROWTH

## 2024-06-28 NOTE — Telephone Encounter (Signed)
 FYI Only or Action Required?: FYI only for provider.  Patient was last seen in primary care on 06/25/2024 by Donzella Lauraine SAILOR, DO.  Called Nurse Triage reporting Nausea and Constipation.  Symptoms began several days ago.  Interventions attempted: Rest, hydration, or home remedies.  Symptoms are: unchanged.  Triage Disposition: See Physician Within 24 Hours, Call PCP Within 24 Hours  Patient/caregiver understands and will follow disposition?: Yes  **Referred to UC; see note below**      Copied from CRM #[id number]. Topic: Clinical - Red Word Triage >> Jun 28, 2024  8:29 AM Charlet HERO wrote: Red Word that prompted transfer to Nurse Triage: Patient is calling she had appt with Dr Donzella  on tue and she was prescribed meds and she is not feeling better, she was told to call back if she had any issues, she is nauseated and can't eat or use the restroom. Pardue Reason for Disposition  Taking prescription medication that could cause nausea (e.g., narcotics/opiates, antibiotics, OCPs, many others)  Last bowel movement (BM) > 4 days ago  Answer Assessment - Initial Assessment Questions 1. NAUSEA SEVERITY: How bad is the nausea? (e.g., mild, moderate, severe; dehydration, weight loss)     Mild-moderate   2. ONSET: When did the nausea begin?     Last Tuesday, she discusses nausea with provider during 10/21 office visit   3. VOMITING: Any vomiting? If Yes, ask: How many times today?     No   4. RECURRENT SYMPTOM: Have you had nausea before? If Yes, ask: When was the last time? What happened that time?     Yes, years ago   5. CAUSE: What do you think is causing the nausea?  Unsure     Patient seen in office on 10/21 for urinary symptoms. She is calling with complaints of nausea, loss of appetite. She completed the Sulfamethoxazole-Trimethoprim, in which she took with food. She reports constipation, last BM was over 5 days ago. Moderate abdominal pain 6/10 noted. Referred  to UC as there are no avail. Appts. Per Epic until 10/27; she agrees with plan of care.  Protocols used: Nausea-A-AH, Constipation-A-AH

## 2024-06-29 ENCOUNTER — Other Ambulatory Visit: Payer: Self-pay

## 2024-06-29 ENCOUNTER — Emergency Department

## 2024-06-29 ENCOUNTER — Emergency Department
Admission: EM | Admit: 2024-06-29 | Discharge: 2024-06-29 | Disposition: A | Discharge status: Home / Self Care | Attending: Emergency Medicine | Admitting: Emergency Medicine

## 2024-06-29 DIAGNOSIS — K5900 Constipation, unspecified: Secondary | ICD-10-CM | POA: Insufficient documentation

## 2024-06-29 DIAGNOSIS — N189 Chronic kidney disease, unspecified: Secondary | ICD-10-CM | POA: Diagnosis not present

## 2024-06-29 DIAGNOSIS — I129 Hypertensive chronic kidney disease with stage 1 through stage 4 chronic kidney disease, or unspecified chronic kidney disease: Secondary | ICD-10-CM | POA: Insufficient documentation

## 2024-06-29 DIAGNOSIS — E1122 Type 2 diabetes mellitus with diabetic chronic kidney disease: Secondary | ICD-10-CM | POA: Insufficient documentation

## 2024-06-29 HISTORY — DX: Pulmonary fibrosis, unspecified: J84.10

## 2024-06-29 MED ORDER — ONDANSETRON 4 MG PO TBDP: 4.0000 mg | ORAL_TABLET | Freq: Once | ORAL | Status: DC

## 2024-06-29 MED ORDER — ONDANSETRON 4 MG PO TBDP
4.0000 mg | ORAL_TABLET | Freq: Once | ORAL | Status: AC
Start: 2024-06-29 — End: 2024-06-29
  Administered 2024-06-29: 4 mg via ORAL
  Filled 2024-06-29: qty 1

## 2024-06-29 MED ORDER — ONDANSETRON 4 MG PO TBDP: 4.0000 mg | ORAL_TABLET | Freq: Three times a day (TID) | ORAL | 0 refills | Status: DC | PRN

## 2024-06-29 MED ORDER — DULCOLAX 5 MG PO TBEC: 5.0000 mg | DELAYED_RELEASE_TABLET | Freq: Every day | ORAL | 0 refills | Status: AC | PRN

## 2024-06-29 NOTE — Discharge Instructions (Signed)
 Follow-up with your primary care provider if any continued problems or return to the emergency department over the weekend if any severe worsening of your symptoms. Drink lots of water.  A prescription for Dulcolax tablets was sent to the pharmacy.  You may take 1 this morning followed by an 8 ounce glass of water and again tonight if you have not had a bowel movement.  After this 1 at bedtime followed by 8 ounces of water.  You may continue taking the MiraLAX  mixed with beverage of choice followed by a glass of water.  This medication usually takes 2 to 3 days and is mainly for softening the stool to make it easier for you to go.  Continue with your regular medications.

## 2024-06-29 NOTE — ED Provider Notes (Signed)
 [facility] Provider Note    Event Date/Time   First MD Initiated Contact with Patient 06/29/24 [phone]     (approximate)   History   Constipation   HPI  Nicole Ballard is a 68 y.o. female   presents to the ED with complaint of constipation.  Patient states that she has not had a BM for 6 days.  Patient has a history of chronic constipation and currently is taking Linzess .  She also reports that she was seen at [facility] yesterday where she did have 1 bowel movement.  She was told to use a enema and take MiraLAX .  She reports that she used an enema again this morning but continues to feel constipated.  In addition to chronic constipation patient has a history of hypertension, diabetes, bipolar 1 disorder, reflux, fibromyalgia, chronic kidney disease, anxiety, brain surgery and systemic lupus erythematosus.      Physical Exam   Triage Vital Signs: ED Triage Vitals  Encounter Vitals Group     BP 06/29/24 0726 122/69     Girls Systolic BP Percentile --      Girls Diastolic BP Percentile --      Boys Systolic BP Percentile --      Boys Diastolic BP Percentile --      Pulse Rate 06/29/24 0726 66     Resp 06/29/24 0726 20     Temp 06/29/24 0726 98.2 F (36.8 C)     Temp Source 06/29/24 0726 Oral     SpO2 06/29/24 0726 100 %     Weight 06/29/24 0728 180 lb (81.6 kg)     Height 06/29/24 0728 5' 7 (1.702 m)     Head Circumference --      Peak Flow --      Pain Score 06/29/24 0727 0     Pain Loc --      Pain Education --      Exclude from Growth Chart --     Most recent vital signs: Vitals:   06/29/24 0726 06/29/24 0856  BP: 122/69 133/69  Pulse: 66 63  Resp: 20 17  Temp: 98.2 F (36.8 C) 98.4 F (36.9 C)  SpO2: 100% 100%     General: Awake, no distress.  CV:  Good peripheral perfusion.  Heart regular rate and rhythm. Resp:  Normal effort.  Lungs clear bilaterally. Abd:  No distention.  Soft, nontender, bowel sounds normoactive x 4  quadrants. Other:     ED Results / Procedures / Treatments   Labs (all labs ordered are listed, but only abnormal results are displayed) Labs Reviewed - No data to display   RADIOLOGY Abdomen 1 view x-ray was reviewed and interpreted by myself independent of the radiologist with no stool burden present.  Official radiology report officially reports nonobstructing bowel gas pattern and mild colonic stool burden on the right.    PROCEDURES:  Critical Care performed:   Procedures   MEDICATIONS ORDERED IN ED: Medications  ondansetron  (ZOFRAN -ODT) disintegrating tablet 4 mg (4 mg Oral Given 06/29/24 0829)     IMPRESSION / MDM / ASSESSMENT AND PLAN / ED COURSE  I reviewed the triage vital signs and the nursing notes.   Differential diagnosis includes, but is not limited to, constipation, impaction, ileus, obstruction, urinary tract infection considered.  68 year old female presents to the ED with complaint of constipation however she does report she had 1 BM while over at the [facility] yesterday.  She states that she  was told to come to the emergency department if she felt the same today.  She used an enema this morning without presence of stool.  Patient was made aware that her x-ray did not show that she had stool in the rectal area and that there was stool present on the right.  She was made aware that the MiraLAX  does not work quickly but that she should continue drinking fluids.  A prescription for Dulcolax was sent to the pharmacy with instructions to take as needed for constipation.  Bowel sounds were reassuring as well as x-ray.  Patient agrees to return if any worsening of her symptoms.      Patient's presentation is most consistent with acute illness / injury with system symptoms.  FINAL CLINICAL IMPRESSION(S) / ED DIAGNOSES   Final diagnoses:  Constipation, unspecified constipation type     Rx / DC Orders   ED Discharge Orders          Ordered     bisacodyl (DULCOLAX) 5 MG EC tablet  Daily PRN        06/29/24 0831    ondansetron  (ZOFRAN -ODT) 4 MG disintegrating tablet  Every 8 hours PRN        06/29/24 0831             Note:  This document was prepared using Dragon voice recognition software and may include unintentional dictation errors.   Saunders Shona CROME, PA-C 06/29/24 1329    Dorothyann Drivers, MD 06/29/24 1432

## 2024-06-29 NOTE — ED Triage Notes (Signed)
 Pt to ED for constipation, hx of same, takes Linzess . LBM 6 days ago. Had recent UTI this past week, treated, saw PCP yesterday and all clear. Denies pain, endorses mild nausea. Pt ambulatory, NAD.

## 2024-07-01 MED ORDER — PREGABALIN 75 MG CAPSULE
ORAL_CAPSULE | Freq: Two times a day (BID) | ORAL | 0 refills | 60.00000 days
Start: 2024-07-01 — End: ?

## 2024-07-09 DIAGNOSIS — K5904 Chronic idiopathic constipation: Principal | ICD-10-CM

## 2024-07-09 MED ORDER — PRUCALOPRIDE 2 MG TABLET
ORAL_TABLET | Freq: Every day | ORAL | 3 refills | 30.00000 days | Status: CP
Start: 2024-07-09 — End: ?

## 2024-07-09 MED ORDER — LINZESS 290 MCG CAPSULE
ORAL_CAPSULE | Freq: Every day | ORAL | 3 refills | 30.00000 days | Status: CP
Start: 2024-07-09 — End: ?

## 2024-07-09 NOTE — Telephone Encounter (Signed)
 Called patient was not able to leave a voicemail. Called to schedule her follow-up appointment from 07/09/24.  Per Dr. Carlyon (Return in about 4 weeks (around 08/06/2024).

## 2024-07-09 NOTE — Progress Notes (Signed)
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 30 min   Stress: Stress Concern Present (05/03/2024)    Received from [facility] - Occupational Stress Questionnaire     Do you feel stress - tense, restless, nervous, or anxious, or unable  to sleep at night because your mind is troubled all the time - these days?: To some extent   Social Connections: Moderately Integrated (05/03/2024)    Received from [facility]    Social Connection and Isolation Panel     In a typical week, how many times do you talk on the phone with family, friends, or neighbors?: More than three times a week     How often do you get together with friends or relatives?: Once a week     How often do you attend church or religious services?: More than 4 times per year     Do you belong to any clubs or organizations such as church groups, unions, fraternal or athletic groups, or school groups?: Yes     How often do you attend meetings of the clubs or organizations you belong to?: More than 4 times per year     Are you married, widowed, divorced, separated, never married, or living with a partner?: Divorced   Housing: Unknown (06/28/2024)    Received from [facility] Housing Stability Vital Sign     At any time in the past 12 months, were you homeless or living in a shelter (including now)?: No       FAMILY HISTORY:    Family History   Problem Relation Age of Onset    Breast cancer Mother     Hypertension Mother     Diabetes Mother     Cancer Mother     Stomach cancer Father 9        unclear where primary was, died age 50    Cancer Father     Diabetes Sister     Cancer Sister     Diabetes Sister     Arthritis Sister     COPD Sister     Diabetes Sister     No Known Problems Daughter     No Known Problems Maternal Grandmother     Glaucoma Maternal Grandfather     Stroke Maternal Grandfather     No Known Problems Paternal Grandmother     Stroke Paternal Grandfather     Diabetes Paternal Grandfather     Breast cancer Maternal Aunt 70    Breast cancer Maternal Aunt 79    Cancer Maternal Aunt 66        unk. primary    Breast cancer Maternal Aunt         died around age 71, unclear age of diagnosis    Breast cancer Maternal Aunt     Cancer Maternal Aunt     Breast cancer Maternal Aunt     Cancer Maternal Aunt     Stomach cancer Maternal Uncle         unsure primary, died age 37    Stomach cancer Paternal Aunt         unclear where primary was    Cancer Paternal Aunt     Cancer Paternal Uncle         back    Breast cancer Cousin 75        Died age 36. Earl's daughter.     Breast cancer Cousin 38        Treated with mastectomy. Now 51. Jo's daughter.     No Known Problems Other     ADD / ADHD Neg Hx     Alcohol abuse Neg Hx     Anxiety disorder Neg Hx     Bipolar disorder  Neg Hx     Dementia Neg Hx     Depression Neg Hx     Drug abuse Neg Hx     OCD Neg Hx     Paranoid behavior Neg Hx     Physical abuse Neg Hx     Schizophrenia Neg Hx     Seizures Neg Hx     Sexual abuse Neg Hx     Colon cancer Neg Hx     Endometrial cancer Neg Hx     Ovarian cancer Neg Hx     BRCA 1/2 Neg Hx     Melanoma Neg Hx     Basal cell carcinoma Neg Hx     Squamous cell carcinoma Neg Hx            VITAL SIGNS:    BP 140/72 (BP Site: L Arm, BP Position: Sitting, BP Cuff Size: Medium)  - Pulse 55  - Ht 170.2 cm (5' 7)  - Wt 86 kg (189 lb 9.6 oz)  - BMI 29.70 kg/m??     PHYSICAL EXAM:  CONSTITUTIONAL: Well developed, well-nourished female in no acute distress.   EYES: conjunctivae clear; no lid lesions; pupils equal round, reactive to light; sclerae anicteric.  ENT: +mask.  NECK:  symmetric, supple.  RESPIRATORY: normal respiratory effort.  CARDIOVASCULAR: regular rate and rhythm, normal S1, S2, no murmurs, rubs, gallops; no pedal edema.  RECTAL: deferred  SKIN: no rashes, lesions  LYMPHATIC: no cervical, submandibular, or supraclavicular adenopathy.  MUSCULOSKELETAL: normal gait and station; no clubbing or cyanosis.  PSYCHIATRIC: awake, alert, and oriented to time, place, and person; insight and judgment adequate; mood and affect congruent.        This note has been created using [employer]. The note has been reviewed for accuracy, however errors may not always be identified. Such creation errors do NOT reflect on the standard of medical care rendered to this patient.

## 2024-07-09 NOTE — Patient Instructions (Signed)
 Bowel prep:   8.3oz bottle of Miralax  and mix it in 64oz of gatorade and then drink 8oz every 15 minutes until it is gone    Repeat this tomorrow    Start Prucalopride daily and stop linzess 

## 2024-07-11 ENCOUNTER — Telehealth: Payer: Self-pay

## 2024-07-11 ENCOUNTER — Ambulatory Visit: Admitting: Family Medicine

## 2024-07-11 NOTE — Telephone Encounter (Signed)
 Copied from CRM #[id number]. Topic: Clinical - Medication Question >> Jul 11, 2024 11:11 AM Avram MATSU wrote: Reason for CRM: patient wanted to know if she is prediabetic or diabetic, she is in the middle of a insurance change for next year. Patient takes Melatonin 10 MG TABS [[id number]] and wanted to know. Please advise [phone] (M) or leave a mychart message

## 2024-07-12 ENCOUNTER — Ambulatory Visit: Payer: Self-pay | Admitting: Family Medicine

## 2024-07-17 DIAGNOSIS — L408 Other psoriasis: Principal | ICD-10-CM

## 2024-07-17 DIAGNOSIS — L409 Psoriasis, unspecified: Principal | ICD-10-CM

## 2024-07-17 MED ORDER — ZORYVE 0.3 % TOPICAL CREAM
TOPICAL | 5 refills | 0.00000 days | Status: CP
Start: 2024-07-17 — End: ?

## 2024-07-17 MED ORDER — TRIAMCINOLONE ACETONIDE 0.1 % TOPICAL OINTMENT
Freq: Two times a day (BID) | TOPICAL | 11 refills | 0.00000 days | Status: CP
Start: 2024-07-17 — End: 2024-07-17

## 2024-07-17 MED ORDER — TACROLIMUS 0.03 % TOPICAL OINTMENT
TOPICAL | 5 refills | 0.00000 days | Status: CP
Start: 2024-07-17 — End: ?

## 2024-07-17 NOTE — Patient Instructions (Addendum)
 Meet your team:     Your intake nurse is: Tully, RN    Please remember to fill out the survey you will receive after your visit. Your comments help us  continue to improve our care.      Thanks in advance!      [facility] Clinical Staff      Every day maintenance creams:  - Roflumilast (mail order)  - Tacrolimus (sent to [facility])    Cream for flares:  - TRIAMCINOLONE . Use sparingly, can thin the skin    Cosmetic Dermatologist: Dr. Beverley Czar Cox    Address: [address], [place], [place] [address]  Hours: Open ? Closes 5?PM  Confirmed by this business 3 weeks ago  Phone: [phone]

## 2024-07-17 NOTE — Progress Notes (Signed)
 Dermatology Note     Assessment and Plan:      Psoriasis, inverse subtype with skin fold involvement, BSA 4% chronic, flaring  - Recommend a barrier cream (zinc oxide, Desitin) in skin fold daily.  Previous tried and failed: Topical steroid, calcipotriene   - Recommend using triamcinolone  ointment sparingly for flares. Counseled on appropriate use of topical steroids. Discussed the common side effects of topical steroids including epidermal atrophy, striae formation, hypopigmentation.  - Of note, she is not a good candidate for MTX given history of eosinophilic pneumonitis- rheumatology and pulmonology agree that this would not be a suitable option for her given her pulmonary history.  - Could consider adding an oral agent if she continues to flare, possibly Otezla (although has history of mood d/o).   - Start tacrolimus (PROTOPIC) 0.03 % ointment; Apply 2 grams topically to affected area of the skin twice daily as maintenance for psoriasis.  Dispense: 90 g; Refill: 5  - Start roflumilast (ZORYVE) 0.3 % Crea; Apply 2 grams topically to affected area of skin daily. Use as maintenance for psoriasis.  Dispense: 60 g; Refill: 5  - Continue triamcinolone  (KENALOG ) 0.1 % ointment; Apply topically two (2) times a day. Apply twice a day to on psoriasis flares until the skin feels smooth, then stop. Only use for flares.  Dispense: 75 g; Refill: 3      The patient was advised to call for an appointment should any new, changing, or symptomatic lesions develop.     RTC: Return in about 3 months (around 10/17/2024). or sooner as needed   _________________________________________________________________      Chief Complaint     Chief Complaint   Patient presents with    Rash     Itching and discoloration under breasts and under abdomen (bikini line)       HPI     Caitlin Smith is a 68 y.o. female who presents as a returning patient (last seen 09/29/2021) to Dermatology for follow up of inverse psoriasis.     Has been flaring off of therapies, using over-the-counter cortisone    The patient denies any other new or changing lesions or areas of concern.     Pertinent Past Medical History     No history of skin cancer    Problem List    None      Family History:   Negative for melanoma    Past Medical History, Family History, Social History, Medication List, Allergies, and Problem List were reviewed in the rooming section of Epic.     ROS: Other than symptoms mentioned in the HPI, no fevers, chills, or other skin complaints    Physical Examination     GENERAL: Well-appearing female in no acute distress, resting comfortably.  NEURO: Alert and oriented, answers questions appropriately  RESP: No increased work of breathing  SKIN (Focal Skin Exam): Per patient request, examination of abdomen, axillae, breasts was performed    Well demarcated silvery plaques on infrapannus and inframammary    All areas not commented on are within normal limits or unremarkable      (Approved Template 05/18/2020)

## 2024-07-18 DIAGNOSIS — L409 Psoriasis, unspecified: Principal | ICD-10-CM

## 2024-07-18 MED ORDER — ZORYVE 0.3 % TOPICAL CREAM
5 refills | 0.00000 days
Start: 2024-07-18 — End: ?

## 2024-07-18 NOTE — Telephone Encounter (Signed)
 A fax received from [place] was scanned into the media tab stating the medication Roflumilast is not an option through their pharmacy.  A new pharmacy needs to be selected.

## 2024-07-19 MED ORDER — ZORYVE 0.3 % TOPICAL CREAM
5 refills | 0.00000 days
Start: 2024-07-19 — End: ?

## 2024-07-19 NOTE — Telephone Encounter (Signed)
 Roflumilast no longer under formulary at [facility], will start tacrolimus ointment

## 2024-07-22 NOTE — Progress Notes (Signed)
 [facility] Refill Coordination Note    Specialty Medication(s) to be Shipped:   Inflammatory Disorders: mycophenolate     Other medication(s) to be shipped: No additional medications requested for fill at this time    Specialty Medications not needed at this time: N/A     Caitlin Smith, DOB: [birth year]  Phone: [phone] (home) [phone] (work)      All above HIPAA information was verified with patient.     Was a nurse, learning disability used for this call? No    Completed refill call assessment today to schedule patient's medication shipment from the [facility]  [phone]).  All relevant notes have been reviewed.     Specialty medication(s) and dose(s) confirmed: Regimen is correct and unchanged.   Changes to medications: Caitlin Smith reports no changes at this time.  Changes to insurance: No  New side effects reported not previously addressed with a pharmacist or physician: None reported  Questions for the pharmacist: No    Confirmed patient received a Conservation Officer, Historic Buildings and a Surveyor, Mining with first shipment. The patient will receive a drug information handout for each medication shipped and additional FDA Medication Guides as required.       DISEASE/MEDICATION-SPECIFIC INFORMATION        N/A    SPECIALTY MEDICATION ADHERENCE     Medication Adherence    Patient reported X missed doses in the last month: 0  Specialty Medication: mycophenolate  360 MG Tbec (MYFORTIC )  Patient is on additional specialty medications: No              Were doses missed due to medication being on hold? No  mycophenolate  360 MG Tbec (MYFORTIC ) : 10 days of medicine on hand       REFERRAL TO PHARMACIST     Referral to the pharmacist: Not needed      [facility]     Shipping address confirmed in Epic.     Cost and Payment: Patient has a $0 copay, payment information is not required.    Delivery Scheduled: Yes, Expected medication delivery date: 11.21.25.     Medication will be delivered via Next Day Courier to the prescription address in Epic WAM.    Caitlin Smith   [facility]  Specialty Technician

## 2024-07-24 ENCOUNTER — Ambulatory Visit
Admission: RE | Admit: 2024-07-24 | Discharge: 2024-07-24 | Disposition: A | Discharge status: Home / Self Care | Attending: Nurse Practitioner | Admitting: Nurse Practitioner

## 2024-07-24 ENCOUNTER — Ambulatory Visit
Admission: RE | Admit: 2024-07-24 | Discharge: 2024-07-24 | Disposition: A | Discharge status: Home / Self Care | Source: Ambulatory Visit | Attending: Nurse Practitioner | Admitting: Nurse Practitioner

## 2024-07-24 ENCOUNTER — Ambulatory Visit: Payer: Self-pay | Admitting: Nurse Practitioner

## 2024-07-24 ENCOUNTER — Ambulatory Visit (INDEPENDENT_AMBULATORY_CARE_PROVIDER_SITE_OTHER): Admitting: Nurse Practitioner

## 2024-07-24 ENCOUNTER — Encounter: Payer: Self-pay | Admitting: Nurse Practitioner

## 2024-07-24 ENCOUNTER — Encounter
Admit: 2024-07-24 | Discharge: 2024-07-25 | Payer: Medicare (Managed Care) | Attending: Psychologist | Primary: Psychologist

## 2024-07-24 VITALS — BP 148/96 | HR 74 | Temp 98.7°F | Ht 67.0 in | Wt 178.0 lb

## 2024-07-24 DIAGNOSIS — K5909 Other constipation: Secondary | ICD-10-CM | POA: Insufficient documentation

## 2024-07-24 DIAGNOSIS — I10 Essential (primary) hypertension: Secondary | ICD-10-CM

## 2024-07-24 DIAGNOSIS — F411 Generalized anxiety disorder: Principal | ICD-10-CM

## 2024-07-24 DIAGNOSIS — F331 Major depressive disorder, recurrent, moderate: Principal | ICD-10-CM

## 2024-07-24 DIAGNOSIS — F4310 Post-traumatic stress disorder, unspecified: Principal | ICD-10-CM

## 2024-07-24 DIAGNOSIS — F410 Panic disorder [episodic paroxysmal anxiety] without agoraphobia: Principal | ICD-10-CM

## 2024-07-24 NOTE — Progress Notes (Signed)
 BP (!) 148/96   Pulse 74   Temp 98.7 F (37.1 C)   Ht 5' 7 (1.702 m)   Wt 178 lb (80.7 kg)   SpO2 97%   BMI 27.88 kg/m    Subjective:    Patient ID: Nicole Ballard, female    DOB: [birth year], 68 y.o.   MRN: [id number]  HPI: Nicole Ballard is a 68 y.o. female presenting today for constipation. She has not had a solid bowel movement in 2 weeks. She does have a history of chronic constipation and is followed by GI. She recently had an appt with GI on 07/09/24 for constipation and was started on prucalopride 2 mg daily along with instructions to do a bowel prep. She has been taking medication and did two bowel prep, but reports she has only had a quarter amount of green loose stool 2 days ago. Denies any blood. She reports intermittent epigastric and hypogastric abdominal aches. She denies vomiting, but reports nausea. She also endorses bloating. Denies fever.    Note from GI:07/09/24  Arrived 40 minutes late stating she went to the hospital instead. Fortunately I was able to work her in for a quick visit. Not doing well with linzess  - hasn't taken it in the past week (ran out) and hasn't had a bowel movement in two weeks. Needs another bowel prep.    ASSESSMENT:   This is a 68 y.o. year old female with pelvic floor dyssynergia and chronic constipation - unfortunately doesn't feel Linzess  has been helpful. Hasn't taken it in the past week and no bowel movement in two weeks. Previously did well well after a bowel prep. Colonoscopy 2023, adenomatous polyp. Recall 7-10 years.    PLAN:   1. Colonoscopy - due 2030-2033 for adenomatous polyp.  2. Start prucalopride 2 mg qd.  3. Mini bowel prep - printed instructions.  4. Follow up by the end of the week if no bowel movement.  5. My contact information was provided and she will call with any questions or concerns in the interim.     Studies completed for constipation [x]  Cross sectional imaging [x]  Anorectal manometry  []   Radiopaque marker study (Sitzmarks) []  Defecography (Barium-rectocele, enterocele, prolapse, intussusception) (MRI-pelvic floor anatomy) []  Breath test for SIBO or IMO   Therapies Trialed for constipation [x]  Biofeedback  [x]  Soluble Fiber-Citrucel/Metamucil/Benefiber  [x]  Laxatives -Miralax  (per patient does not work) []  Suppositories- Glycerin [x]  Enemas-Saline []  Manual Disimpaction [x]  Splinting []  TCA/secondary amines (eg, desipramine, nortriptyline)  []  Lubiprostone (Amitiza) (type 2 chloride channels activator) [x]  Linaclotide  (Linzess ) (guanylate cyclase C receptor (GCC)  []  Plecanatide (Trulance) (guanylate cyclase C receptor (GCC) agonist) []  Prucalopride (Motegrity) (selective serotonin type 4 (5-HT4) receptor agonist) []  Colchicine   Bowel Prep Instructions: 8.3oz bottle of Miralax  and mix it in 64oz of gatorade and then drink 8oz every 15 minutes until it is gone.   Most recent imaging 06/29/24:  EXAM: 1 VIEW XRAY OF THE ABDOMEN 06/29/2024 07:51:00 AM   COMPARISON: CT abdomen and pelvis 04/07/2023, and earlier.   CLINICAL HISTORY: Constipation. Patient to ED for constipation, history of same, takes Linzess . Last bowel movement 6 days ago. Had recent UTI this past week, treated, saw PCP yesterday and all clear. Denies pain, endorses mild nausea.   FINDINGS:   BOWEL: Nonobstructive bowel gas pattern. Mild colonic stool burden predominantly in the right colon.   SOFT TISSUES: Cholecystectomy clips in right upper quadrant. No opaque urinary calculi.   BONES: Lumbar spine degenerative  changes. No acute osseous abnormality.   IMPRESSION: 1. Nonobstructing bowel gas pattern. 2. Mild colonic stool burden, right-predominant.   Electronically signed by: Helayne Hurst MD 06/29/2024 07:59 AM EDT RP Workstation: [id number]   HTN- Continue amlodipine  7.5 mg daily, atenolol  25 mg BID, telmisartan  80 mg daily  BP Readings from Last 3 Encounters:  07/24/24 (!)  148/96  06/29/24 133/69  06/25/24 122/66       07/24/2024    1:22 PM 06/25/2024    2:14 PM 05/03/2024    9:48 AM  Depression screen PHQ 2/9  Decreased Interest 2 2 1   Down, Depressed, Hopeless 1 2 1   PHQ - 2 Score 3 4 2   Altered sleeping 3 2 1   Tired, decreased energy 2 2 1   Change in appetite 2 1 1   Feeling bad or failure about yourself  1 1 0  Trouble concentrating 1 2 1   Moving slowly or fidgety/restless 1  0  Suicidal thoughts 0 0 0  PHQ-9 Score 13 12  6    Difficult doing work/chores  Somewhat difficult Somewhat difficult     Data saved with a previous flowsheet row definition    Relevant past medical, surgical, family and social history reviewed and updated as indicated. Interim medical history since our last visit reviewed. Allergies and medications reviewed and updated.  Review of Systems  Ten systems reviewed and is negative except as mentioned in HPI      Objective:     BP (!) 148/96   Pulse 74   Temp 98.7 F (37.1 C)   Ht 5' 7 (1.702 m)   Wt 178 lb (80.7 kg)   SpO2 97%   BMI 27.88 kg/m    Wt Readings from Last 3 Encounters:  07/24/24 178 lb (80.7 kg)  06/29/24 180 lb (81.6 kg)  06/25/24 185 lb 8 oz (84.1 kg)    Physical Exam Constitutional:      Appearance: Normal appearance.  HENT:     Head: Normocephalic and atraumatic.  Cardiovascular:     Rate and Rhythm: Normal rate and regular rhythm.     Pulses: Normal pulses.     Heart sounds: Normal heart sounds.  Pulmonary:     Effort: Pulmonary effort is normal.     Breath sounds: Normal breath sounds.  Abdominal:     General: Abdomen is flat. Bowel sounds are normal. There is no distension.     Palpations: Abdomen is soft.     Tenderness: There is no abdominal tenderness. There is no guarding.  Musculoskeletal:        General: Normal range of motion.     Cervical back: Normal range of motion and neck supple.  Skin:    General: Skin is warm and dry.  Neurological:     General: No focal  deficit present.     Mental Status: She is alert and oriented to person, place, and time.  Psychiatric:        Mood and Affect: Mood normal.        Behavior: Behavior normal.        Thought Content: Thought content normal.        Judgment: Judgment normal.      Results for orders placed or performed in visit on 06/25/24  Urine Culture   Collection Time: 06/25/24 12:00 AM   Specimen: Urine   Urine  Result Value Ref Range   Urine Culture, Routine Final report    Organism ID, Bacteria No growth  POCT Urinalysis Dipstick   Collection Time: 06/25/24  2:15 PM  Result Value Ref Range   Color, UA Yellow    Clarity, UA Clear    Glucose, UA Negative Negative   Bilirubin, UA Small    Ketones, UA Negative    Spec Grav, UA 1.020 1.010 - 1.025   Blood, UA Moderate    pH, UA 6.0 5.0 - 8.0   Protein, UA Positive (A) Negative   Urobilinogen, UA 0.2 0.2 or 1.0 E.U./dL   Nitrite, UA Negative    Leukocytes, UA Moderate (2+) (A) Negative   Appearance     Odor            Assessment & Plan:   Problem List Items Addressed This Visit       Cardiovascular and Mediastinum   Benign hypertension   Patient reports her blood pressure is elevated due to stress of constipation. Told patient to monitor blood pressure and report back if it continues to be elevated.  Continue amlodipine  7.5 mg daily, atenolol  25 mg BID, telmisartan  80 mg daily        Digestive   Chronic constipation - Primary   She has been taking prucalopride 2 mg daily and has done two bowel preps per GI and has only had a quarter size amount of lose still in 2 weeks. No abdominal tenderness noted on exam. Ordered abdominal x-ray. Advised to follow-up GI per their note.       Relevant Orders   DG Abd 1 View (Completed)    Ordered abdominal x-ray for no bm in 2 weeks, despite two bowel preps and taking prucalopride daily, ruling out bowel obstruction. We will notify patient of results. Advised to follow-up with GI per  their note, following her appointment with them on 07/09/24.          Follow up plan: Return if symptoms worsen or fail to improve.   I have reviewed this encounter including the documentation in this note and/or discussed this patient with the provider, Aislinn Womack, SNP, I am certifying that I agree with the content of this note as supervising/preceptor nurse practitioner.  Mliss Spray, FNP-C [facility] 07/24/2024, 3:26 PM

## 2024-07-24 NOTE — Assessment & Plan Note (Signed)
 She has been taking prucalopride 2 mg daily and has done two bowel preps per GI and has only had a quarter size amount of lose still in 2 weeks. No abdominal tenderness noted on exam. Ordered abdominal x-ray. Advised to follow-up GI per their note.

## 2024-07-24 NOTE — Assessment & Plan Note (Addendum)
 Patient reports her blood pressure is elevated due to stress of constipation. Told patient to monitor blood pressure and report back if it continues to be elevated.  Continue amlodipine  7.5 mg daily, atenolol  25 mg BID, telmisartan  80 mg daily

## 2024-07-24 NOTE — Progress Notes (Signed)
 [facility]   Confidential Psychological Therapy Session      Patient Name: Caitlin Smith  Medical Record Number: [id number]  Date of Service: July 24, 2024  Attending Psychologist: Greig Holland, PhD  CPT Procedure Codes: 09162 for 60 mins of face to face counseling    This visit was attempted via face to face with interactive technology using a HIPPA compliant audio/visual platform; patient unable to connect and so telephone was used. Her computer apparently broke last night. We reviewed confidentiality today. The patient was present in [place] , a state in which this provider is licensed and able to provide care (location and contact information confirmed), attended this visit alone, and consented to this virtual pain psychology visit.    REFERRING PHYSICIAN: Priscilla Agent, MD    CHIEF COMPLAINT AND REASON FOR VISIT: pain coping skills, CBT to address depression and anxiety in the setting of chronic pain    SUBJECTIVE / HISTORY OF PRESENT ILLNESS: [name] is a very pleasant 68 y.o.  female from [place], [place] with multiple chronic pain complaints related to fibromyalgia and lupus who initially met with me in October 2016, at which time she was diagnosed with severe depression, PTSD, panic disorder, and generalized anxiety. The patient returns for a therapy session today. Last follow up with me was in 06/10/24.    Pt processes thoughts and feelings about health stressors, using a mindfulness approach.  Has struggle with constipation, recent UTI and nausea. On miralax  and discussed behavioral strategies to address constipation and associated risks (infection, bowel obstruction). Provided support, reviewed problems solving. Developed short and long term plan to cope with constipation using behavioral strategies along with meds prescribed/recommended.     OBJECTIVE / MENTAL STATUS:    Appearance:   Appears stated age and Clean/Neat   Motor:  No abnormal movements Speech/Language:   Normal rate, volume, tone, fluency   Mood:  Depressed and Anxious   Affect:  euthymic   Thought process:  Logical, linear, clear, coherent, goal directed   Thought content:    Denies SI, HI, self harm, delusions, obsessions, paranoid ideation, or ideas of reference   Perceptual disturbances:    Denies auditory and visual hallucinations, behavior not concerning for response to internal stimuli   Orientation:  Oriented to person, place, time, and general circumstances   Attention:  Able to fully attend without fluctuations in consciousness   Concentration:  Able to fully concentrate and attend   Memory:  Immediate, short-term, long-term, and recall grossly intact    Fund of knowledge:   Consistent with level of education and development   Insight:    Fair   Judgment:   Intact   Impulse Control:  Intact     DIAGNOSTIC IMPRESSION:   Post Traumatic Stress Disorder (PTSD)  Generalized Anxiety Disorder (GAD)  Panic Disorder  Major Depressive Disorder, moderate, recurrent  Chronic pain syndrome  Fibromyalgia  Lupus    ASSESSMENT:   [name] is a very pleasant 68 y.o.  female from [place], [place] with multiple chronic pain complaints related to lupus, arthritis, and fibromyalgia. She was previously seen in our clinic from 2012-2014, and reestablished care with Caitlin Smith in August 2016. The patient has struggled with depression and anxiety, but is very motivated to participate in intensive, multidisciplinary treatment to address the connection between depression, anxiety and pain. She has a long-standing history of depression and anxiety, and has worked with outpatient psychiatrist and therapist over the years.  She is currently established with [facility] psychiatry.  Currently working on behavioral strategies to address constipation.    PLAN:   (1) Psychotherapy - Continue CBT. CBT will be used to address PTSD, MDD, Panic D/o, GAD, and chronic pain.     --Behavioral Activation - Encouraged behavioral activation.  Is trying. Walking in AM.  --Downloaded and uses Insight Timer, guided relaxation app, for nightly practice.  --Continues to utilize diaphragmatic breathing daily  --encouraged early morning sunlight / light therapy for possible SAD in the setting of chronic MDD    (2) Psychiatry - Pt currently established with [facility].      (3) Safety - Pt denies any current or recent SI or safety concerns. Knows to call 911 or go to her local ED. Also previously given [facility].

## 2024-07-24 NOTE — Progress Notes (Signed)
 [facility]  Psychiatry   Established Patient E&M Service - Outpatient     Assessment:  Caitlin Smith presents for follow-up evaluation.     Patient reports mild exacerbation of anxiety in setting of her sister's recent passing one week ago. In the interim, she successfully stopped Klonopin  use, however, recent stress led to taking one tablet within the last week. Patient initially requested Klonopin  refill but was amenable to short-course of Atarax  10 mg BID PRN instead. She will reach out if she feels this is not effective. Will not make any additional medication adjustments today given heightened psychosocial stressors, but future visits should continue to address polypharmacy.     No acute safety concerns. Resident transition discussed and follow-up scheduled.    Identifying Information:  Caitlin Smith is a 68 y.o. female with a history of  MDD, GAD, and Panic Disorder in the context of many chronic medical conditions including chronic pain/fibromyalgia, osteoarthritis with degenerative disc disease, Lupus, pituitary tumor (benign), s/p right total knee arthroplasty, and VIN II (s/p resection). She had been maintained on Lamictal , trazodone , klonopin , and Wellbutrin . Due concerns for polypharmacy and impact on cognition, Klonopin  has been gradually tapered to discontinuation and recently tolerated a Lamictal  taper. Her mood can be exacerbated by steroid use for pulmonary disease. Patient is engaged in therapy at the pain clinic and finds it helpful.     Past medications:  Cymbalta   Effexor      Risk Assessment:  An assessment of suicide and violence risk factors was performed as part of this evaluation and is not significantly changed from the last visit. While future psychiatric events cannot be accurately predicted, the patient does not currently require acute inpatient psychiatric care and does not currently meet [place]  involuntary commitment criteria.      Plan:  Problem: Major depression, Anxiety NOS, Panic Disorder   Status of problem: chronic and stable  Interventions:   -- Continue Lamictal  200mg  qhs for mood (decreased from 200 mg BID in 04/2024 and patient tolerated this well)  -- Continue Wellbutrin  300 mg daily   -- Patient recently stopped Klonopin  0.25 mg PRN, however, sister's passing has made her anxious and she took one tablet in the last week. Has not been filled since 04/18/24.  -- Start short-course Atarax  10 mg BID PRN for anxiety/sleep   -- Followed by Dr. Greig Holland with pain management for therapy     Problem: Insomnia   Status of problem: chronic and stable  Interventions:   -- Last sleep study performed in 2017. Per chart review, showed mild OSA likely related to medication use. Neurology recommends patient avoid sleeping on back as she likely has OSA when sleeping in this position. Not using CPAP.  -- Continue trazodone  150 mg at bedtime  -- Continue melatonin 3-5 mg at bedtime     Problem: Concern for Neurocognitive Disorder   Status of problem:  chronic   Interventions:  -- Patient seen by Neurology ([facility]) 05/22/18 dx pt with mild neurocognitive impairment w/ deficits in inattention and visual-spatial function with suspicion that sleep apnea and polypharmacy may be contributing to her difficulties. Also likely that patient's Lupus is contributing to cognitive deficits.  -- Last MOCA 22 on 12/12/2017  -- Repeat MOCA at next in-person visit     Problem: Polypharmacy   Status of problem:  chronic and stable  Interventions:  -- Patient amendable to de-prescribing/safer alternatives  -- Consider transition from Wellbutrin  to Zoloft or Lexapro  for anxiety/depression, decreasing trazodone , discontinuing Lamictal , and encouraging not using Klonopin /safer alternatives to avoid it    Problem: High Risk Medication monitoring, Lamictal   Status of problem:  Stable   Interventions:   - CBC 06/05/24 with ANC 2.0  - RFT 06/05/24 Cr 0.91, eGFR 70   - AST and ALT 01/18/24 WNLs    Problem: Chronic Conditions  Status of problem:  chronic and stable  Interventions:  -- Chronic pain/fibromyalgia: Pregabalin  75 mg BID   -- Lupus: hydroxychloroquine   -- Constipation: Linzess , prucalopride  -- HTN: telmisartan , hydralazine , amlodipine    -- HLD: atorvastatin   -- Restrictive lung disease: mycophenolate , inhalers, pulmonary rehab, intermittent steroid use      Psychotherapy provided:  No billable psychotherapy service provided but brief supportive therapy was utilized.    Patient has been given this writer's contact information as well as the [facility] urgent line number. The patient has been instructed to call 911 for emergencies.    Patient was seen and plan of care was discussed with the Attending MD,Dr. Gust, who agrees with the above statement and plan.    Subjective:  Patient reports recent stress associated with her sister's passing. Is currently grieving her loss and feels anxiety has been exacerbated. She states that she was successfully able to stop using Klonopin , but admits to taking one tablet in the last week given recent stress. She requests Klonopin  refill and we discussed PRN alternative which she is open to trying. Aside from this, she states she is doing okay. Continues to attend therapy by pain clinic. In times of stress, she states she tries to adhere to healthy diet, and takes time for herself to go on walks. Reports good support from family during this stressful time. Denies medication side effects.    Objective:    Mental Status Exam:    Appearance:    Appears stated age, Well nourished and Clean/Neat   Motor:    No abnormal movements appreciated   Speech/Language:    Normal rate, volume, tone, fluency and Language intact, well formed   Mood:   Doing okay   Affect:   Calm, Cooperative and Full   Thought process and Associations:   Logical, linear, clear, coherent, goal directed   Abnormal/psychotic thought content:     Denies SI, HI, self harm, delusions, obsessions, paranoid ideation, or ideas of reference   Perceptual disturbances:     Denies auditory and visual hallucinations, behavior not concerning for response to internal stimuli     Other:   insight/judgement intact        The patient reports they are physically located in [place]  and is currently: at home. I conducted a audio/video visit. I spent  1m 28s on the video call with the patient. I spent an additional 10 minutes on pre- and post-visit activities on the date of service .     Caitlin LITTIE Legato, MD  PGY-2 Psychiatry

## 2024-07-25 DIAGNOSIS — F411 Generalized anxiety disorder: Principal | ICD-10-CM

## 2024-07-25 MED ORDER — HYDROXYZINE HCL 10 MG TABLET
ORAL_TABLET | Freq: Two times a day (BID) | ORAL | 0 refills | 15.00000 days | Status: CP | PRN
Start: 2024-07-25 — End: ?

## 2024-07-25 MED FILL — MYCOPHENOLATE SODIUM 360 MG TABLET,DELAYED RELEASE: ORAL | 30 days supply | Qty: 120 | Fill #2

## 2024-07-26 MED ORDER — BUPROPION HCL XL 300 MG 24 HR TABLET, EXTENDED RELEASE
ORAL_TABLET | Freq: Every day | ORAL | 0 refills | 90.00000 days
Start: 2024-07-26 — End: 2024-10-24

## 2024-07-26 MED ORDER — LAMOTRIGINE 200 MG TABLET
ORAL_TABLET | Freq: Every evening | ORAL | 0 refills | 90.00000 days
Start: 2024-07-26 — End: ?

## 2024-07-26 MED ORDER — TRAZODONE 100 MG TABLET
ORAL_TABLET | Freq: Every evening | ORAL | 0 refills | 90.00000 days
Start: 2024-07-26 — End: 2024-10-24

## 2024-07-27 MED ORDER — BUPROPION HCL XL 300 MG 24 HR TABLET, EXTENDED RELEASE
ORAL_TABLET | Freq: Every day | ORAL | 0 refills | 90.00000 days | Status: CP
Start: 2024-07-27 — End: 2024-10-25

## 2024-07-27 MED ORDER — TRAZODONE 100 MG TABLET
ORAL_TABLET | Freq: Every evening | ORAL | 0 refills | 90.00000 days | Status: CP
Start: 2024-07-27 — End: 2024-10-25

## 2024-07-27 MED ORDER — LAMOTRIGINE 200 MG TABLET
ORAL_TABLET | Freq: Every evening | ORAL | 0 refills | 90.00000 days | Status: CP
Start: 2024-07-27 — End: ?

## 2024-07-27 NOTE — Patient Instructions (Addendum)
 It was great to see you in clinic today. As we discussed:  -- Start hydroxyzine  (Atarax ) 10 mg twice a day as needed for anxiety or sleep.   -- Please reach out if you do not find this is effective for you.   -- Continue all other medications as prescribed.    Follow-up instructions:  - Please continue taking your medications as prescribed for your mental health.   - Do not make changes to your medications, including taking more or less than prescribed, unless under the supervision of your physician. Be aware that some medications may make you feel worse if abruptly stopped  - Please refrain from using illicit substances, as these can affect your mood and could cause anxiety or other concerning symptoms.   - Seek further medical care for any increase in symptoms or new symptoms such as thoughts of wanting to hurt yourself or hurt others.     Contact info:  Life-threatening emergencies: Call 911, the 988 suicide and crisis lifeline, or go to the nearest ER for medical or psychiatric attention.           Issues that need urgent attention but are not life threatening: Call the clinic outpatient front desk for assistance:    - [phone] for [facility] adult [facility] clinics located at [address] [facility]  - [phone] for [facility] child and adolescent psychiatry clinics located at [address]  - [phone] for [facility] and adult psychiatry clinics located at [address] [facility]    Non-urgent routine concerns and questions: Send a message through [facility] or call our clinic front desk.    Refill requests: Check with your pharmacy to initiate refill requests.    Regarding appointments:  - If you need to cancel your appointment, we ask that you call your clinic at least 24 hours before your scheduled appointment at the numbers listed above.  - If for any reason you arrive 15 minutes later than your scheduled appointment time, you may not be seen and your visit may be rescheduled.  - Please remember that we will not automatically reschedule missed appointments.  - If you miss two (2) appointments without letting us  know in advance, you will likely be referred to a provider in your community.  - We will do our best to be on time. Sometimes an emergency will arise that might cause your clinician to be late. We will try to inform you of this when you check in for your appointment. If you wait more than 15 minutes past your appointment time without such notice, please speak with the front desk staff.    In the event of bad weather, the clinic staff will attempt to contact you, should your appointment need to be rescheduled. Additionally, you can call the Patient Weather Line [phone] for system-wide clinic status    For more information and reminders regarding clinic policies (these were provided when you were admitted to the clinic), please ask the front desk.

## 2024-07-28 NOTE — Progress Notes (Signed)
This was a video service where a resident was involved. The resident and the patient met via video.  As the attending physician, I spent 5 minutes in medical discussion with the patient via real-time audio and video, participating in the key portions of the service.I spent an additional 10 minutes on pre- and post-visit activities which were specific to the patient and included reviewing the patient’s medical records, lab results, imaging results, and other pertinent records. I reviewed the resident's note and I agree with the resident's findings and plan.    Cristino Degroff L Ainara Eldridge, MD

## 2024-07-29 NOTE — Telephone Encounter (Signed)
 Called Caitlin Smith to follow-up on her constipation symptoms.  Prior to 11/4, Caitlin Smith tells me she was taking Linzess  290mcg po daily and Miralaz 4 capfuls per day (as well as prn Ducoloax and Fleets enemas).  She tells me she would go 2 weeks at a time without BM.    On 11/4, she was advised to stop Linzess  and change to prucalopride 2mg  daily after a mini bowel prep.    Caitlin Smith has reported little stool output and has also seen PCP at [facility] for abdominal x ray.    Today, Caitlin Smith reports that she is taking:    prucalopride 2mg  daily  Miralax  3 times per day--2 capful in AM, 2 capful at lunch, 2 capful in evening (she sometimes decreases that to 4 capfuls per day).  Dulcolax-- takes this about once per week  Enema-Fleets -- was taking daily, but it wasn't working--last time she took this was last week  Magnesium citrate- tried this but it made her nauseated and she stopped.    Feels hungry, hears rumbling and growling in her abdomen. Denies any abdominal pain. Denies bleeding. She endorses weight loss, reporting a weight on her home scale of 182 lbs on 10/28, and 176 lbs today. She also reports that her clothes don't fit and are loose.    Caitlin Smith tells me nothing is working but also reports that she is having liquid, green stool every day.  She finds the green color quite distressing.  We discussed with the large amount of Miralax  she is taking is likely making her stool quite liquid. Caitlin Smith would prefer to have a more formed BM and is willing to go back to Linzess , but also agreed that prucalopride seems to be more effective at producing stool output.    We discussed ED precautions--inability to pass gas, inability to pass stool, abdominal pain that is severe or worsening in the setting of an inability to have a BM.  Caitlin Smith stated understanding. She'll continue her current medications for now and is open to suggestions for medication changes.

## 2024-07-30 DIAGNOSIS — H0102A Squamous blepharitis right eye, upper and lower eyelids: Principal | ICD-10-CM

## 2024-07-30 DIAGNOSIS — M329 Systemic lupus erythematosus, unspecified: Principal | ICD-10-CM

## 2024-07-30 DIAGNOSIS — H04129 Dry eye syndrome of unspecified lacrimal gland: Principal | ICD-10-CM

## 2024-07-30 DIAGNOSIS — H0102B Squamous blepharitis left eye, upper and lower eyelids: Principal | ICD-10-CM

## 2024-07-30 MED ORDER — LIFITEGRAST 5 % EYE DROPS IN A DROPPERETTE
Freq: Two times a day (BID) | OPHTHALMIC | 11 refills | 30.00000 days | Status: CP
Start: 2024-07-30 — End: ?

## 2024-07-30 NOTE — Progress Notes (Signed)
 Caitlin Smith is a 68 y.o. female presenting for dry eye evaluation.     #Dry eye syndrome, aqueous deficient - both eyes  #Blepharitis, both eyes  - Followed in past by Dr. Dineen  - Prior tx: artificial tears, punctal pugs, restasis, prednisolone , olopatadine   - 2008 [place] test: 6mm OD, 3mm OS  - Symptoms: foreign body sensation, itching, tearing, blurred vision  - Exam with scleral show OU, inferior PEE OS, RTBUT    #Cataracts, both eyes  - mild, observe    #SLE with long term Plaquenil  use  - No history of retinopathy  - Follows with Dr. Odelia, due for annual exam    Recommendations:  - Attempted to place punctal plugs today however limited by size - 0.76mm too small, 1.48mm too large.   - Start Xiidra  both eyes BID  - Preservative free artificial tears 6x daily  - AT ointment at night   - Daily lid hygiene    Follow up:  - Next available with Dr. Odelia for annual plaquenil  exam  - As needed for ongoing dryness    Seen with Dr. Signe Shelvy Rhea, MD

## 2024-07-30 NOTE — Telephone Encounter (Signed)
 Called back to pt. Advised her per PA Carlyon to decrease Miralax  to one caupful a day. Wait 3 days to increase the dose. Try to take 1-3 capufuls per day in addition to the prucalopride 2mg  daily. Advised her the goal is a soft BM every 1-3 days.  Advised her to keep us  posted on how this is going and call if she has 3-5 days without BM, increasing abd pain or other GI symptoms. Pt stated understanding.

## 2024-08-05 ENCOUNTER — Ambulatory Visit: Payer: Self-pay

## 2024-08-05 DIAGNOSIS — F411 Generalized anxiety disorder: Principal | ICD-10-CM

## 2024-08-05 MED ORDER — HYDROXYZINE HCL 10 MG TABLET
ORAL_TABLET | Freq: Two times a day (BID) | ORAL | 1 refills | 0.00000 days | PRN
Start: 2024-08-05 — End: ?

## 2024-08-05 NOTE — Telephone Encounter (Signed)
 FYI Only or Action Required?: FYI only for provider: appointment scheduled on 08/06/2024 at 3:20 PM.  Patient was last seen in primary care on 07/24/2024 by Gareth Mliss FALCON, FNP.  Called Nurse Triage reporting Abdominal Pain.  Symptoms began a week ago.  Interventions attempted: Prescription medications: See MAR and Rest, hydration, or home remedies.  Symptoms are: stable.  Triage Disposition: See Physician Within 24 Hours  Patient/caregiver understands and will follow disposition?: Yes  Copied from CRM #[id number]. Topic: Clinical - Red Word Triage >> Aug 05, 2024  8:40 AM Joesph NOVAK wrote: Red Word that prompted transfer to Nurse Triage: Issues with abdominal pain and nausea, concerned with weight loss. Reason for Disposition  [1] MODERATE pain (e.g., interferes with normal activities) AND [2] comes and goes (cramps) AND [3] present > 24 hours  (Exception: Pain with Vomiting or Diarrhea - see that Guideline.)  Answer Assessment - Initial Assessment Questions Patient is concerned with upper abdominal pain that started 1.5 weeks ago. Patient reports a pain level of a 6.5. endorses weight loss and nausea. Not a big appetite. Patient reports she has lost 11 pounds since 06/05/2024 without trying to lose weight. Patient with a hx of chronic constipation-patient does report green bowel movements that have gotten better. Patient states pain is intermittent. Reports she has been speaking to her GI office and medications were switched per patient.   1. LOCATION: Where does it hurt?      Upper abdominal area 2. RADIATION: Does the pain shoot anywhere else? (e.g., chest, back)     Patient states pain with radiate into the lower part of her abdomen 3. ONSET: When did the pain begin? (e.g., minutes, hours or days ago)      Started a 1.5 weeks ago.  4. SUDDEN: Gradual or sudden onset?     gradual 5. PATTERN Does the pain come and go, or is it constant?     Comes and goes 6. SEVERITY: How  bad is the pain?  (e.g., Scale 1-10; mild, moderate, or severe)     6.5 7. RECURRENT SYMPTOM: Have you ever had this type of stomach pain before? If Yes, ask: When was the last time? and What happened that time?      yes 8. AGGRAVATING FACTORS: Does anything seem to cause this pain? (e.g., foods, stress, alcohol)     Unsure. 9. CARDIAC SYMPTOMS: Do you have any of the following symptoms: chest pain, difficulty breathing, sweating, nausea?     nausea 10. OTHER SYMPTOMS: Do you have any other symptoms? (e.g., back pain, diarrhea, fever, urination pain, vomiting)       Constipation, weight loss  Protocols used: Abdominal Pain - Upper-A-AH

## 2024-08-06 ENCOUNTER — Encounter: Payer: Self-pay | Admitting: Family Medicine

## 2024-08-06 ENCOUNTER — Ambulatory Visit: Admitting: Family Medicine

## 2024-08-06 VITALS — BP 124/76 | HR 93 | Resp 16 | Ht 67.0 in | Wt 177.2 lb

## 2024-08-06 DIAGNOSIS — F419 Anxiety disorder, unspecified: Secondary | ICD-10-CM

## 2024-08-06 DIAGNOSIS — R634 Abnormal weight loss: Secondary | ICD-10-CM

## 2024-08-06 DIAGNOSIS — R110 Nausea: Secondary | ICD-10-CM | POA: Diagnosis not present

## 2024-08-06 MED ORDER — HYDROXYZINE HCL 10 MG PO TABS: 10.0000 mg | ORAL_TABLET | Freq: Three times a day (TID) | ORAL | 0 refills | Status: DC | PRN

## 2024-08-06 MED ORDER — ONDANSETRON HCL 4 MG PO TABS: 4.0000 mg | ORAL_TABLET | Freq: Three times a day (TID) | ORAL | 0 refills | Status: AC | PRN

## 2024-08-06 NOTE — Progress Notes (Signed)
 Name: PAQUITA PRINTY   MRN: [id number]    DOB: 04-Oct-1955   Date:08/06/2024       Progress Note  Subjective  Chief Complaint  Chief Complaint  Patient presents with   Abdominal Pain    Patient is concerned with upper abdominal pain that started 1.5 weeks ago. Patient reports a pain level of a 6.5. endorses weight loss and nausea. Not a big appetite. Patient reports she has lost 11 pounds since 06/05/2024 without trying to lose weight.   Discussed the use of AI scribe software for clinical note transcription with the patient, who gave verbal consent to proceed.  History of Present Illness KIRSTY MONJARAZ is a 68 year old female who presents with unintentional weight loss and constipation.  She has experienced a weight loss of approximately 12 pounds since October 1st, coinciding with having her teeth removed on October 28th, which has affected her ability to eat. Her current diet includes mashed potatoes and soft foods, and she is consuming protein shakes to help maintain her calorie intake.  She has significant constipation, previously managed with Linzess , which was discontinued. She is now on a new medication taken once daily but continues to struggle with bowel movements. She uses Miralax , enemas, and increased fluid intake to manage her symptoms. She has ongoing communication with her neurologist's nurse regarding these issues.  She experiences persistent nausea, which she associates with her weight loss and constipation. She uses Zofran  for nausea, preferring the tablet form.  She has a history of anxiety and has been prescribed hydroxyzine  for management. She was previously on clonazepam  but has since stopped taking it. She spent Thanksgiving with her family, although she found it difficult to eat due to her dental issues.    Patient Active Problem List   Diagnosis Date Noted   Chronic pain of both shoulders 02/15/2024   Balance problem 02/01/2024   Diabetic polyneuropathy  associated with type 2 diabetes mellitus (HCC) 02/01/2024   Lumbar facet arthropathy 10/02/2023   Genetic testing 03/14/2023   Abnormal SPEP 01/13/2023   Weight loss 01/13/2023   Family history of breast cancer 01/13/2023   Asthma, chronic, severe persistent, uncomplicated (HCC) 10/12/2022   Interstitial lung disease (HCC) 07/12/2022   Empty sella 07/12/2022   History of craniotomy 07/12/2022   Immunocompromised 04/29/2021   Hyperglycemia 10/23/2017   Vertigo 09/29/2016   Bronchiolitis obliterans organizing pneumonia (HCC) 09/29/2016   Neck pain, musculoskeletal 12/15/2015   Cough in adult 09/11/2015   Abnormal CAT scan 04/11/2015   Auditory impairment 04/11/2015   Insomnia, persistent 04/11/2015   Chronic kidney disease (CKD), stage I 04/11/2015   Chronic nonmalignant pain 04/11/2015   Chronic constipation 04/11/2015   Dyslipidemia 04/11/2015   Fibromyalgia 04/11/2015   Gastro-esophageal reflux disease without esophagitis 04/11/2015   Dysphonia 04/11/2015   Low back pain 04/11/2015   Eczema intertrigo 04/11/2015   Keratosis pilaris 04/11/2015   Chronic recurrent major depressive disorder 04/11/2015   Dysmetabolic syndrome 04/11/2015   Neurogenic claudication 04/11/2015   Perennial allergic rhinitis with seasonal variation 04/11/2015   Abnormal presence of protein in urine 04/11/2015   Seborrhea capitis 04/11/2015   Moderate dysplasia of vulva 04/11/2015   Vitamin D  deficiency 04/11/2015   Spinal stenosis of lumbar region 04/11/2015   Treadmill stress test negative for angina pectoris 03/05/2015   Shortness of breath 05/20/2014   H/O total knee replacement 12/31/2013   Episcleritis 09/26/2013   Vocal cord dysfunction 05/28/2013   Anxiety, generalized 03/19/2013   Back  pain 12/18/2012   Pulmonary nodules 11/26/2012   Lung nodule, multiple 11/26/2012   Arthritis, degenerative 11/01/2012   H/O aspiration pneumonitis 10/22/2012   Postinflammatory pulmonary fibrosis (HCC)  10/22/2012   Mixed incontinence 05/04/2012   Lung involvement in systemic lupus erythematosus (HCC) 11/05/2010   Chronic nausea 07/01/2008   Benign hypertension 01/25/2005    Past Surgical History:  Procedure Laterality Date   BRAIN SURGERY     CHOLECYSTECTOMY     VAGINAL HYSTERECTOMY     vulvar surgery      Family History  Problem Relation Age of Onset   Diabetes Mother    Hypertension Mother    Breast cancer Mother    Stomach cancer Father        d.>50   Breast cancer Maternal Aunt        dx >50   Breast cancer Maternal Aunt        dx 51s   Breast cancer Maternal Aunt    Cancer Maternal Uncle        unk type   Cancer Maternal Uncle        unk type   Breast cancer Cousin        dx 4s   Breast cancer Cousin        dx 30s, does not want genetic testing    Social History   Tobacco Use   Smoking status: Never   Smokeless tobacco: Never  Substance Use Topics   Alcohol use: No    Alcohol/week: 0.0 standard drinks of alcohol     Current Outpatient Medications:    albuterol  (VENTOLIN  HFA) 108 (90 Base) MCG/ACT inhaler, SMARTSIG:2 Puff(s) By Mouth Every 6 Hours PRN, Disp: , Rfl:    amLODipine  (NORVASC ) 2.5 MG tablet, Take 3 tablets (7.5 mg total) by mouth daily., Disp: 270 tablet, Rfl: 0   aspirin  EC 81 MG tablet, Take 1 tablet (81 mg total) by mouth daily., Disp: 30 tablet, Rfl: 0   atenolol  (TENORMIN ) 25 MG tablet, Take 1 tablet by mouth in the morning and at bedtime., Disp: , Rfl:    atorvastatin  (LIPITOR) 40 MG tablet, TAKE 1 TABLET BY MOUTH DAILY, Disp: 100 tablet, Rfl: 2   Biotin  1 MG CAPS, Take 1 capsule by mouth daily., Disp: 30 capsule, Rfl: 0   bisacodyl (DULCOLAX) 5 MG EC tablet, Take 1 tablet (5 mg total) by mouth daily as needed for moderate constipation., Disp: 6 tablet, Rfl: 0   buPROPion  (WELLBUTRIN  XL) 300 MG 24 hr tablet, Take 300 mg by mouth in the morning., Disp: , Rfl:    Cholecalciferol (VITAMIN D ) 2000 UNITS CAPS, Take 1 capsule by mouth  daily., Disp: , Rfl:    clobetasol ointment (TEMOVATE) 0.05 %, Apply 1 application topically 2 (two) times daily., Disp: , Rfl:    clonazePAM  (KLONOPIN ) 0.5 MG tablet, Take 0.25 mg by mouth at bedtime., Disp: , Rfl:    cyclobenzaprine  (FLEXERIL ) 5 MG tablet, Take by mouth., Disp: , Rfl:    diclofenac  Sodium (VOLTAREN ) 1 % GEL, Apply topically daily as needed., Disp: , Rfl:    famotidine (PEPCID) 20 MG tablet, Take 20 mg by mouth daily., Disp: , Rfl:    fluticasone  (FLONASE ) 50 MCG/ACT nasal spray, Place 2 sprays into both nostrils daily., Disp: 48 g, Rfl: 3   hydrALAZINE  (APRESOLINE ) 25 MG tablet, Take 1 tablet (25 mg total) by mouth 4 (four) times daily as needed (For sBP>150 and dBP>90)., Disp: 90 tablet, Rfl: 0   hydroxychloroquine (  PLAQUENIL) 200 MG tablet, Take by mouth 2 (two) times daily., Disp: , Rfl:    hydrOXYzine  (ATARAX ) 10 MG tablet, Take 1 tablet (10 mg total) by mouth 3 (three) times daily as needed., Disp: 30 tablet, Rfl: 0   lamoTRIgine  (LAMICTAL ) 200 MG tablet, Take 200 mg by mouth at bedtime., Disp: , Rfl:    lidocaine  (XYLOCAINE ) 5 % ointment, Apply 1 Application topically 4 (four) times daily as needed., Disp: , Rfl:    LINZESS  145 MCG CAPS capsule, Take 145 mcg by mouth daily., Disp: , Rfl:    loratadine  (CLARITIN ) 10 MG tablet, Take 1 tablet (10 mg total) by mouth daily., Disp: 90 tablet, Rfl: 1   Melatonin 10 MG TABS, Take 1 tablet by mouth at bedtime., Disp: , Rfl:    metFORMIN  (GLUCOPHAGE -XR) 500 MG 24 hr tablet, TAKE 1 TABLET BY MOUTH DAILY  WITH BREAKFAST, Disp: 100 tablet, Rfl: 2   mycophenolate  (MYFORTIC) 360 MG TBEC EC tablet, Take 720 mg by mouth 2 (two) times daily., Disp: , Rfl:    olopatadine  (PATANOL) 0.1 % ophthalmic solution, Place 1 drop into both eyes 2 (two) times daily., Disp: 5 mL, Rfl: 1   omeprazole  (PRILOSEC) 40 MG capsule, Take 1 capsule by mouth in the morning and at bedtime. Pulmonologist, Disp: , Rfl:    ondansetron  (ZOFRAN ) 4 MG tablet, Take 1  tablet (4 mg total) by mouth every 8 (eight) hours as needed for nausea or vomiting., Disp: 20 tablet, Rfl: 0   ondansetron  (ZOFRAN -ODT) 4 MG disintegrating tablet, Take 1 tablet (4 mg total) by mouth every 8 (eight) hours as needed for nausea., Disp: 8 tablet, Rfl: 0   pregabalin (LYRICA) 75 MG capsule, Take 75 mg by mouth 2 (two) times daily., Disp: , Rfl:    QVAR REDIHALER 80 MCG/ACT inhaler, Inhale 2 puffs into the lungs., Disp: , Rfl:    Spacer/Aero-Holding Chambers (AEROCHAMBER MV) inhaler, 1 each by Other route., Disp: , Rfl:    telmisartan  (MICARDIS ) 80 MG tablet, TAKE 1 TABLET BY MOUTH DAILY, Disp: 100 tablet, Rfl: 2   traZODone (DESYREL) 100 MG tablet, Take 150 mg by mouth at bedtime., Disp: , Rfl:    triamcinolone  ointment (KENALOG ) 0.1 %, , Disp: , Rfl:   Allergies  Allergen Reactions   Ace Inhibitors     Other reaction(s): OTHER Pt states she can not take ace inhibitors. Pt states she can not remember her reaction.    Bee Pollen    Penicillins    Pollen Extract     I personally reviewed active problem list, medication list, allergies, family history with the patient/caregiver today.   ROS  Ten systems reviewed and is negative except as mentioned in HPI    Objective Physical Exam MEASUREMENTS: Weight- 177. CONSTITUTIONAL: Patient appears well-developed and well-nourished.  No distress. HEENT: Head atraumatic, normocephalic, neck supple. CARDIOVASCULAR: Normal rate, regular rhythm and normal heart sounds.  No murmur heard. No BLE edema. PULMONARY: Effort normal and breath sounds normal. No respiratory distress. ABDOMINAL: There is no tenderness or distention. MUSCULOSKELETAL: Normal gait. Without gross motor or sensory deficit. PSYCHIATRIC: Patient has a normal mood and affect. behavior is normal. Judgment and thought content normal.  Vitals:   08/06/24 1515  BP: 124/76  Pulse: 93  Resp: 16  SpO2: 99%  Weight: 177 lb 3.2 oz (80.4 kg)  Height: 5' 7 (1.702 m)     Body mass index is 27.75 kg/m.  Recent Results (from the past 2160 hours)  Urine Culture     Status: None   Collection Time: 06/25/24 12:00 AM   Specimen: Urine   Urine  Result Value Ref Range   Urine Culture, Routine Final report    Organism ID, Bacteria No growth   POCT Urinalysis Dipstick     Status: Abnormal   Collection Time: 06/25/24  2:15 PM  Result Value Ref Range   Color, UA Yellow    Clarity, UA Clear    Glucose, UA Negative Negative   Bilirubin, UA Small    Ketones, UA Negative    Spec Grav, UA 1.020 1.010 - 1.025   Blood, UA Moderate    pH, UA 6.0 5.0 - 8.0   Protein, UA Positive (A) Negative   Urobilinogen, UA 0.2 0.2 or 1.0 E.U./dL   Nitrite, UA Negative    Leukocytes, UA Moderate (2+) (A) Negative   Appearance     Odor      Diabetic Foot Exam:     PHQ2/9:    08/06/2024    3:13 PM 07/24/2024    1:22 PM 06/25/2024    2:14 PM 05/03/2024    9:48 AM 04/18/2024    9:08 AM  Depression screen PHQ 2/9  Decreased Interest 2 2 2 1 1   Down, Depressed, Hopeless 1 1 2 1 1   PHQ - 2 Score 3 3 4 2 2   Altered sleeping 3 3 2 1 1   Tired, decreased energy 2 2 2 1 1   Change in appetite 2 2 1 1 1   Feeling bad or failure about yourself  1 1 1  0 0  Trouble concentrating 1 1 2 1  0  Moving slowly or fidgety/restless 1 1  0 0  Suicidal thoughts 0 0 0 0 0  PHQ-9 Score 13 13 12  6  5    Difficult doing work/chores Very difficult  Somewhat difficult Somewhat difficult Somewhat difficult     Data saved with a previous flowsheet row definition    phq 9 is positive  Fall Risk:    08/06/2024    3:13 PM 06/25/2024    2:14 PM 05/03/2024   11:10 AM 05/03/2024    9:43 AM 04/18/2024    9:08 AM  Fall Risk   Falls in the past year? 0 0 0 0 0  Number falls in past yr: 0 0 0 0 0  Injury with Fall? 0 0  0  0  0   Risk for fall due to : No Fall Risks No Fall Risks No Fall Risks No Fall Risks No Fall Risks  Follow up Falls evaluation completed  Falls evaluation completed Falls  evaluation completed Falls evaluation completed     Data saved with a previous flowsheet row definition      Assessment & Plan Abnormal weight loss Weight loss likely due to dietary changes post-dental extractions and stress. - Encouraged pureed and soft foods to increase caloric intake. - Recommended protein shakes and Greek yogurt for protein. - Reassured about commonality of weight fluctuations based on her previous history of weight gain and weight loss. - Continue GI follow-up for constipation management.  Constipation Chronic constipation with recent medication changes, possibly contributing to nausea and weight loss. - Continue current management with GI specialist. - Use Miralax  and enemas as needed.  Nausea Persistent nausea possibly related to constipation, reflux, and anxiety. - Prescribed Zofran  for nausea. - Advised on dietary modifications.  Anxiety Chronic anxiety exacerbated by weight loss and dental issues. - Prescribed hydroxyzine  as needed. -  Advised on drowsiness as a side effect.

## 2024-08-06 NOTE — Progress Notes (Signed)
 I saw and evaluated the patient, participating in the key portions of the service.  I reviewed the resident???s note.  I agree with the resident???s findings and plan.     Whitney Post, MD

## 2024-08-07 ENCOUNTER — Emergency Department

## 2024-08-07 ENCOUNTER — Emergency Department
Admission: EM | Admit: 2024-08-07 | Discharge: 2024-08-07 | Disposition: A | Discharge status: Home / Self Care | Attending: Emergency Medicine | Admitting: Emergency Medicine

## 2024-08-07 ENCOUNTER — Encounter: Payer: Self-pay | Admitting: Intensive Care

## 2024-08-07 ENCOUNTER — Other Ambulatory Visit: Payer: Self-pay

## 2024-08-07 DIAGNOSIS — I129 Hypertensive chronic kidney disease with stage 1 through stage 4 chronic kidney disease, or unspecified chronic kidney disease: Secondary | ICD-10-CM | POA: Insufficient documentation

## 2024-08-07 DIAGNOSIS — N189 Chronic kidney disease, unspecified: Secondary | ICD-10-CM | POA: Diagnosis not present

## 2024-08-07 DIAGNOSIS — R1010 Upper abdominal pain, unspecified: Secondary | ICD-10-CM | POA: Insufficient documentation

## 2024-08-07 DIAGNOSIS — E1122 Type 2 diabetes mellitus with diabetic chronic kidney disease: Secondary | ICD-10-CM | POA: Diagnosis not present

## 2024-08-07 HISTORY — DX: Systemic lupus erythematosus, unspecified: M32.9

## 2024-08-07 LAB — URINALYSIS, ROUTINE W REFLEX MICROSCOPIC
Bilirubin Urine: NEGATIVE
Glucose, UA: NEGATIVE mg/dL
Hgb urine dipstick: NEGATIVE
Ketones, ur: NEGATIVE mg/dL
Leukocytes,Ua: NEGATIVE
Nitrite: NEGATIVE
Protein, ur: 100 mg/dL — AB
Specific Gravity, Urine: 1.023 (ref 1.005–1.030)
pH: 5 (ref 5.0–8.0)

## 2024-08-07 LAB — CBC WITH DIFFERENTIAL/PLATELET
Abs Immature Granulocytes: 0.02 K/uL (ref 0.00–0.07)
Basophils Absolute: 0.1 K/uL (ref 0.0–0.1)
Basophils Relative: 2 %
Eosinophils Absolute: 0.1 K/uL (ref 0.0–0.5)
Eosinophils Relative: 2 %
HCT: 42.8 % (ref 36.0–46.0)
Hemoglobin: 13.5 g/dL (ref 12.0–15.0)
Immature Granulocytes: 1 %
Lymphocytes Relative: 37 %
Lymphs Abs: 1.6 K/uL (ref 0.7–4.0)
MCH: 27.2 pg (ref 26.0–34.0)
MCHC: 31.5 g/dL (ref 30.0–36.0)
MCV: 86.3 fL (ref 80.0–100.0)
Monocytes Absolute: 0.6 K/uL (ref 0.1–1.0)
Monocytes Relative: 14 %
Neutro Abs: 1.9 K/uL (ref 1.7–7.7)
Neutrophils Relative %: 44 %
Platelets: 306 K/uL (ref 150–400)
RBC: 4.96 MIL/uL (ref 3.87–5.11)
RDW: 13.5 % (ref 11.5–15.5)
WBC: 4.2 K/uL (ref 4.0–10.5)
nRBC: 0 % (ref 0.0–0.2)

## 2024-08-07 LAB — COMPREHENSIVE METABOLIC PANEL WITH GFR
ALT: 7 U/L (ref 0–44)
AST: 22 U/L (ref 15–41)
Albumin: 4.2 g/dL (ref 3.5–5.0)
Alkaline Phosphatase: 103 U/L (ref 38–126)
Anion gap: 10 (ref 5–15)
BUN: 7 mg/dL — ABNORMAL LOW (ref 8–23)
CO2: 26 mmol/L (ref 22–32)
Calcium: 9.8 mg/dL (ref 8.9–10.3)
Chloride: 105 mmol/L (ref 98–111)
Creatinine, Ser: 0.84 mg/dL (ref 0.44–1.00)
GFR, Estimated: >60 mL/min (ref >60)
Glucose, Bld: 119 mg/dL — ABNORMAL HIGH (ref 70–99)
Potassium: 4 mmol/L (ref 3.5–5.1)
Sodium: 141 mmol/L (ref 135–145)
Total Bilirubin: 0.5 mg/dL (ref 0.0–1.2)
Total Protein: 7.8 g/dL (ref 6.5–8.1)

## 2024-08-07 LAB — LIPASE, BLOOD: Lipase: 17 U/L (ref 11–51)

## 2024-08-07 MED ORDER — HYDROXYZINE HCL 10 MG TABLET
ORAL_TABLET | Freq: Two times a day (BID) | ORAL | 0 refills | 30.00000 days | Status: CP | PRN
Start: 2024-08-07 — End: ?

## 2024-08-07 MED ORDER — METOCLOPRAMIDE HCL 10 MG PO TABS: 10.0000 mg | ORAL_TABLET | Freq: Three times a day (TID) | ORAL | 0 refills | Status: DC | PRN

## 2024-08-07 MED ORDER — IOHEXOL 300 MG/ML  SOLN
100.0000 mL | Freq: Once | INTRAMUSCULAR | Status: AC | PRN
  Administered 2024-08-07: 100 mL via INTRAVENOUS

## 2024-08-07 MED ORDER — DIPHENHYDRAMINE HCL 50 MG/ML IJ SOLN
12.5000 mg | Freq: Once | INTRAMUSCULAR | Status: AC
  Administered 2024-08-07: 12.5 mg via INTRAVENOUS
  Filled 2024-08-07: qty 1

## 2024-08-07 MED ORDER — KETOROLAC TROMETHAMINE 15 MG/ML IJ SOLN
15.0000 mg | Freq: Once | INTRAMUSCULAR | Status: AC
  Administered 2024-08-07: 15 mg via INTRAVENOUS
  Filled 2024-08-07: qty 1

## 2024-08-07 MED ORDER — METOCLOPRAMIDE HCL 5 MG/ML IJ SOLN
10.0000 mg | Freq: Once | INTRAMUSCULAR | Status: AC
  Administered 2024-08-07: 10 mg via INTRAVENOUS
  Filled 2024-08-07: qty 2

## 2024-08-07 NOTE — ED Provider Notes (Signed)
 [facility] Provider Note    Event Date/Time   First MD Initiated Contact with Patient 08/07/24 [phone]     (approximate)   History   Abdominal Pain   HPI  Nicole Ballard is a 68 y.o. female with PMH of fibromyalgia, lupus, CKD, hypertension, chronic pain, diabetes and constipation who presents for evaluation of abdominal pain.  Patient states that she had some dental work done at the end of October and it has been difficult to eat since.  She reports some decreased oral intake and subsequent weight loss.  She also has found it difficult to eat due to her persistent nausea.  Patient has a long history of constipation and has been on multiple medications to treat this.  She is also followed by a GI specialist.  Today she reports increasing abdominal pain with nausea.  She has not had a solid bowel movement in over a week.  She endorses small bowel movements of green liquid stool.  No vomiting, fevers or urinary symptoms.      Physical Exam   Triage Vital Signs: ED Triage Vitals  Encounter Vitals Group     BP 08/07/24 0710 (!) 147/93     Girls Systolic BP Percentile --      Girls Diastolic BP Percentile --      Boys Systolic BP Percentile --      Boys Diastolic BP Percentile --      Pulse Rate 08/07/24 0710 94     Resp 08/07/24 0710 18     Temp 08/07/24 0710 98 F (36.7 C)     Temp Source 08/07/24 0710 Oral     SpO2 08/07/24 0710 100 %     Weight 08/07/24 0709 174 lb (78.9 kg)     Height 08/07/24 0709 5' 7 (1.702 m)     Head Circumference --      Peak Flow --      Pain Score 08/07/24 0708 6     Pain Loc --      Pain Education --      Exclude from Growth Chart --     Most recent vital signs: Vitals:   08/07/24 0710  BP: (!) 147/93  Pulse: 94  Resp: 18  Temp: 98 F (36.7 C)  SpO2: 100%    General: Awake, uncomfortable and anxious appearing. CV:  Good peripheral perfusion.  RRR. Resp:  Normal effort.  CTAB. Abd:  No distention.  Soft,  tender to palpation across the upper abdomen. Other:     ED Results / Procedures / Treatments   Labs (all labs ordered are listed, but only abnormal results are displayed) Labs Reviewed  COMPREHENSIVE METABOLIC PANEL WITH GFR - Abnormal; Notable for the following components:      Result Value   Glucose, Bld 119 (*)    BUN 7 (*)    All other components within normal limits  URINALYSIS, ROUTINE W REFLEX MICROSCOPIC - Abnormal; Notable for the following components:   Color, Urine AMBER (*)    APPearance HAZY (*)    Protein, ur 100 (*)    Bacteria, UA RARE (*)    All other components within normal limits  LIPASE, BLOOD  CBC WITH DIFFERENTIAL/PLATELET     RADIOLOGY  CT abdomen pelvis obtained, interpreted the images as well as reviewed the radiologist report which did not show any acute abnormalities.  PROCEDURES:  Critical Care performed: No  Procedures   MEDICATIONS ORDERED IN ED: Medications  metoCLOPramide  (  REGLAN ) injection 10 mg (10 mg Intravenous Given 08/07/24 0847)  diphenhydrAMINE  (BENADRYL ) injection 12.5 mg (12.5 mg Intravenous Given 08/07/24 0847)  ketorolac  (TORADOL ) 15 MG/ML injection 15 mg (15 mg Intravenous Given 08/07/24 0848)  iohexol  (OMNIPAQUE ) 300 MG/ML solution 100 mL (100 mLs Intravenous Contrast Given 08/07/24 0905)     IMPRESSION / MDM / ASSESSMENT AND PLAN / ED COURSE  I reviewed the triage vital signs and the nursing notes.                             68 year old female presents for evaluation of abdominal pain with associated nausea and constipation.  Blood pressure is a little bit elevated but vital signs are stable otherwise.  Patient is uncomfortable and anxious appearing on exam.  Differential diagnosis includes, but is not limited to, biliary disease, pancreatitis, gastritis, chronic pain, constipation, bowel obstruction, malignancy, anxiety.  Patient's presentation is most consistent with acute complicated illness / injury requiring  diagnostic workup.  Feel that bowel obstruction is less likely given patient has been able to pass flatus and is not having any vomiting.  Her abdomen is soft and nondistended but she is tender to palpation across the upper abdomen.  Will obtain basic labs and consider imaging.  Patient had an abdominal x-ray on 07/24/2024 that showed nonobstructive bowel gas pattern.  Results reviewed with the patient. Labs are reassuring but given patient's history of unintentional weight loss and her persistent symptoms we will proceed with CT scan for further evaluation.   Given reassuring labs and negative CT scan feel that patient is stable for discharge.  Discussed following up with her GI specialist and taking Tylenol  and ibuprofen as needed for pain.  Reviewed return precautions.  Patient voiced understanding, questions were answered and she was stable at discharge.  Clinical Course as of 08/07/24 1541  Wed Aug 07, 2024  0823 CBC with Differential Unremarkable. [LD]  [phone] Comprehensive metabolic panel(!) Unremarkable. [LD]  0823 Lipase, blood Within normal limits. [LD]  [id number] Urinalysis, Routine w reflex microscopic -Urine, Clean Catch(!) Presence of protein and hyaline casts, rare bacteria without nitrites, leukocytes or WBC so do not feel this is consistent with UTI. Suspect proteinuria and hyaline casts due to patient's kidney disease. [LD]  9055 CT ABDOMEN PELVIS W CONTRAST No acute findings. [LD]    Clinical Course User Index [LD] Cleaster Tinnie LABOR, PA-C     FINAL CLINICAL IMPRESSION(S) / ED DIAGNOSES   Final diagnoses:  Pain of upper abdomen     Rx / DC Orders   ED Discharge Orders          Ordered    metoCLOPramide  (REGLAN ) 10 MG tablet  Every 8 hours PRN        08/07/24 1024             Note:  This document was prepared using Dragon voice recognition software and may include unintentional dictation errors.   Cleaster Tinnie LABOR, PA-C 08/07/24 1541    Viviann Pastor, MD 08/10/24 2320

## 2024-08-07 NOTE — Discharge Instructions (Signed)
 Your blood work and CT scan was normal today.  I will send some additional nausea medication to the pharmacy.  I recommend following up with your GI specialist.  Please take Tylenol  as needed for pain.  Return to the emergency department with any worsening symptoms.

## 2024-08-07 NOTE — ED Triage Notes (Signed)
 Patient c/o abdominal pain and nausea that started last night. Reports she has been intermittently constipated over the last month but not today

## 2024-08-07 NOTE — ED Notes (Signed)
 See triage note  Presents with some abd pain pain  States she has an issue with IBS with constipation  Was recently started on a different med for same States she just is nauseated   and feels bad

## 2024-08-13 DIAGNOSIS — K5909 Other constipation: Principal | ICD-10-CM

## 2024-08-13 MED ORDER — LINZESS 72 MCG CAPSULE
ORAL_CAPSULE | ORAL | 3 refills | 0.00000 days | Status: CP
Start: 2024-08-13 — End: ?
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
## 2024-08-15 ENCOUNTER — Emergency Department

## 2024-08-15 ENCOUNTER — Emergency Department: Admission: EM | Admit: 2024-08-15 | Discharge: 2024-08-15 | Disposition: A | Discharge status: Home / Self Care

## 2024-08-15 ENCOUNTER — Other Ambulatory Visit: Payer: Self-pay

## 2024-08-15 ENCOUNTER — Encounter: Payer: Self-pay | Admitting: Medical Oncology

## 2024-08-15 DIAGNOSIS — R1013 Epigastric pain: Secondary | ICD-10-CM | POA: Diagnosis not present

## 2024-08-15 DIAGNOSIS — R110 Nausea: Secondary | ICD-10-CM | POA: Diagnosis present

## 2024-08-15 LAB — URINALYSIS, ROUTINE W REFLEX MICROSCOPIC
Bacteria, UA: NONE SEEN
Bilirubin Urine: NEGATIVE
Glucose, UA: NEGATIVE mg/dL
Hgb urine dipstick: NEGATIVE
Ketones, ur: 20 mg/dL — AB
Nitrite: NEGATIVE
Protein, ur: 30 mg/dL — AB
Specific Gravity, Urine: 1.024 (ref 1.005–1.030)
pH: 6 (ref 5.0–8.0)

## 2024-08-15 LAB — CBC
HCT: 44.1 % (ref 36.0–46.0)
Hemoglobin: 13.9 g/dL (ref 12.0–15.0)
MCH: 26.8 pg (ref 26.0–34.0)
MCHC: 31.5 g/dL (ref 30.0–36.0)
MCV: 85.1 fL (ref 80.0–100.0)
Platelets: 344 K/uL (ref 150–400)
RBC: 5.18 MIL/uL — ABNORMAL HIGH (ref 3.87–5.11)
RDW: 13.2 % (ref 11.5–15.5)
WBC: 3.8 K/uL — ABNORMAL LOW (ref 4.0–10.5)
nRBC: 0 % (ref 0.0–0.2)

## 2024-08-15 LAB — COMPREHENSIVE METABOLIC PANEL WITH GFR
ALT: 13 U/L (ref 0–44)
AST: 22 U/L (ref 15–41)
Albumin: 4.2 g/dL (ref 3.5–5.0)
Alkaline Phosphatase: 99 U/L (ref 38–126)
Anion gap: 10 (ref 5–15)
BUN: 8 mg/dL (ref 8–23)
CO2: 27 mmol/L (ref 22–32)
Calcium: 9.1 mg/dL (ref 8.9–10.3)
Chloride: 101 mmol/L (ref 98–111)
Creatinine, Ser: 0.81 mg/dL (ref 0.44–1.00)
GFR, Estimated: >60 mL/min (ref >60)
Glucose, Bld: 113 mg/dL — ABNORMAL HIGH (ref 70–99)
Potassium: 4.6 mmol/L (ref 3.5–5.1)
Sodium: 138 mmol/L (ref 135–145)
Total Bilirubin: 0.5 mg/dL (ref 0.0–1.2)
Total Protein: 7.9 g/dL (ref 6.5–8.1)

## 2024-08-15 LAB — LIPASE, BLOOD: Lipase: 16 U/L (ref 11–51)

## 2024-08-15 MED ORDER — FAMOTIDINE IN NACL 20-0.9 MG/50ML-% IV SOLN
20.0000 mg | Freq: Once | INTRAVENOUS | Status: AC
  Administered 2024-08-15: 20 mg via INTRAVENOUS
  Filled 2024-08-15: qty 50

## 2024-08-15 MED ORDER — ALUM & MAG HYDROXIDE-SIMETH 200-200-20 MG/5ML PO SUSP
30.0000 mL | Freq: Once | ORAL | Status: AC
  Administered 2024-08-15: 30 mL via ORAL
  Filled 2024-08-15: qty 30

## 2024-08-15 MED ORDER — DIPHENHYDRAMINE HCL 50 MG/ML IJ SOLN
12.5000 mg | Freq: Once | INTRAMUSCULAR | Status: AC
  Administered 2024-08-15: 12.5 mg via INTRAVENOUS
  Filled 2024-08-15: qty 1

## 2024-08-15 MED ORDER — SODIUM CHLORIDE 0.9 % IV BOLUS
1000.0000 mL | Freq: Once | INTRAVENOUS | Status: AC
  Administered 2024-08-15: 1000 mL via INTRAVENOUS

## 2024-08-15 MED ORDER — METOCLOPRAMIDE HCL 10 MG PO TABS: 10.0000 mg | ORAL_TABLET | Freq: Three times a day (TID) | ORAL | 0 refills | Status: AC | PRN

## 2024-08-15 MED ORDER — METOCLOPRAMIDE HCL 5 MG/ML IJ SOLN
10.0000 mg | Freq: Once | INTRAMUSCULAR | Status: AC
  Administered 2024-08-15: 10 mg via INTRAVENOUS
  Filled 2024-08-15: qty 2

## 2024-08-15 MED ORDER — LIDOCAINE VISCOUS HCL 2 % MT SOLN
15.0000 mL | Freq: Once | OROMUCOSAL | Status: AC
  Administered 2024-08-15: 15 mL via ORAL
  Filled 2024-08-15: qty 15

## 2024-08-15 NOTE — ED Triage Notes (Signed)
 Pt reports that she began having upper abd for a few days and has been having nausea. Reports issues with constipation recently placed on linzess . Denies vomiting or fever

## 2024-08-15 NOTE — ED Provider Notes (Signed)
 [facility] Provider Note    Event Date/Time   First MD Initiated Contact with Patient 08/15/24 1734     (approximate)   History   Abdominal Pain and Nausea  First Nurse Note:  Pt via [facility] from home. Pt c/o constipation for 2 weeks. States GI doc has been changing her meds, Pt c/o abd pain and nausea today. Pt is A&Ox4 and NAD.  EMS gave 4mg  of Zofran   Pt reports that she began having upper abd for a few days and has been having nausea. Reports issues with constipation recently placed on linzess . Denies vomiting or fever   HPI Nicole Ballard is a 68 y.o. female PMH CKD, SLE, diabetes, fibromyalgia, hypertension, hyperlipidemia, bipolar 1 disorder, neurogenic claudication, pulmonary fibrosis, GERD presents for evaluation of nausea - Patient tells me she chronically has constipation, have been having difficulty with bowel movements over the past 2 weeks but did actually have a normal bowel movement today.  Does feel she is having much worse than usual nausea however.  No vomiting.  Some epigastric discomfort though denies any frank pain.  No fevers.  Has had some intermittent urinary urgency/frequency. - No chest pain or shortness of breath - Has otherwise been in her usual state of health - Is followed closely by GI, see summary of recent visit below.  Tells me she was recently restarted on Linzess   Per chart review, patient was last seen in our emergency department on 08/07/2024 for complaint of abdominal pain and constipation.  Followed by GI for this.  Reported no bowel movement in over a week at that point.  Labs unremarkable, CT abdomen pelvis unremarkable.  UA equivocal.  Subsequently had telemedicine visit for chronic constipation on 08/13/2024.  Restarted on Linzess .     Physical Exam   Triage Vital Signs: ED Triage Vitals [08/15/24 1544]  Encounter Vitals Group     BP (!) 164/85     Girls Systolic BP Percentile      Girls Diastolic BP  Percentile      Boys Systolic BP Percentile      Boys Diastolic BP Percentile      Pulse Rate 66     Resp 16     Temp 97.8 F (36.6 C)     Temp Source Oral     SpO2 99 %     Weight 171 lb 15.3 oz (78 kg)     Height 5' 7 (1.702 m)     Head Circumference      Peak Flow      Pain Score 7     Pain Loc      Pain Education      Exclude from Growth Chart     Most recent vital signs: Vitals:   08/15/24 1544 08/15/24 1809  BP: (!) 164/85   Pulse: 66   Resp: 16   Temp: 97.8 F (36.6 C)   SpO2: 99% 99%     General: Awake, no distress.  CV:  Good peripheral perfusion. RRR, RP 2+ Resp:  Normal effort. CTAB Abd:  No distention.  Tenderness in epigastrium only, no tenderness to deep palpation elsewhere throughout abdomen.  No peritonitic exam.    ED Results / Procedures / Treatments   Labs (all labs ordered are listed, but only abnormal results are displayed) Labs Reviewed  COMPREHENSIVE METABOLIC PANEL WITH GFR - Abnormal; Notable for the following components:      Result Value   Glucose, Bld 113 (*)  All other components within normal limits  CBC - Abnormal; Notable for the following components:   WBC 3.8 (*)    RBC 5.18 (*)    All other components within normal limits  URINALYSIS, ROUTINE W REFLEX MICROSCOPIC - Abnormal; Notable for the following components:   Color, Urine YELLOW (*)    APPearance CLEAR (*)    Ketones, ur 20 (*)    Protein, ur 30 (*)    Leukocytes,Ua TRACE (*)    All other components within normal limits  LIPASE, BLOOD     EKG  Ecg = sinus rhythm, rate 69, no gross ST elevation or depression, no significant repolarization abnormality, left axis deviation, normal intervals.  No clear evidence of ischemia nor arrhythmia on my interpretation.   RADIOLOGY Radiology interpreted myself and radiology report reviewed.  No acute pathology identified.    PROCEDURES:  Critical Care performed: No  Procedures   MEDICATIONS ORDERED IN  ED: Medications  sodium chloride  0.9 % bolus 1,000 mL (1,000 mLs Intravenous New Bag/Given 08/15/24 1855)  metoCLOPramide  (REGLAN ) injection 10 mg (10 mg Intravenous Given 08/15/24 1857)  diphenhydrAMINE  (BENADRYL ) injection 12.5 mg (12.5 mg Intravenous Given 08/15/24 1856)  famotidine (PEPCID) IVPB 20 mg premix (20 mg Intravenous New Bag/Given 08/15/24 1901)  alum & mag hydroxide-simeth (MAALOX/MYLANTA) 200-200-20 MG/5ML suspension 30 mL (30 mLs Oral Given 08/15/24 1902)    And  lidocaine  (XYLOCAINE ) 2 % viscous mouth solution 15 mL (15 mLs Oral Given 08/15/24 1902)     IMPRESSION / MDM / ASSESSMENT AND PLAN / ED COURSE  I reviewed the triage vital signs and the nursing notes.                              DDX/MDM/AP: Differential diagnosis includes, but is not limited to, exacerbation of chronic nausea, though fortunately had bowel movement today and is not having any vomiting--clinically doubt bowel obstruction or other acute intra-abdominal pathology with very benign abdominal exam here.  Consider some component of gastritis or dyspepsia possibly contributing.  Highly doubt ACS.  Did have equivocal urine last time he and has had some intermittent urinary urgency epinephrine will screen with UA to ensure no underlying UTI contributing.  Plan: - Labs - EKG - GI cocktail, Reglan , IV fluid - XR abdomen, no clinical indication for repeat CT at this time - Reassess  Patient's presentation is most consistent with acute presentation with potential threat to life or bodily function.  The patient is on the cardiac monitor to evaluate for evidence of arrhythmia and/or significant heart rate changes.  ED course below.   Clinical Course as of 08/15/24 1947  Thu Aug 15, 2024  1916 UA not c/w infxn [MM]  1916 XR abdomen: IMPRESSION: Mild stool content primarily in the proximal colon. No bowel obstruction.   [MM]  1943 Patient reevaluated, tolerating good p.o. intake, feeling much better  after medications here.  He is already on omeprazole  twice daily and tells me she is taking Maalox in addition.  Has Zofran  at home.  Will Rx Reglan  for use in addition, encouraged her to use Tylenol  as needed for any ongoing discomfort as she has not yet been doing this.  Repeat abdominal exam benign.  No evidence of acute pathology today and patient feels much better and is stable for outpatient follow-up.  Encouraged her to follow-up with her gastroenterologist.  ED return precautions in place.  Patient and family agree with plan. [  MM]    Clinical Course User Index [MM] Clarine Ozell LABOR, MD     FINAL CLINICAL IMPRESSION(S) / ED DIAGNOSES   Final diagnoses:  Nausea     Rx / DC Orders   ED Discharge Orders          Ordered    metoCLOPramide  (REGLAN ) 10 MG tablet  Every 8 hours PRN        08/15/24 1946             Note:  This document was prepared using Dragon voice recognition software and may include unintentional dictation errors.   Clarine Ozell LABOR, MD 08/15/24 [phone]

## 2024-08-15 NOTE — ED Triage Notes (Signed)
 First Nurse Note:  Pt via [facility] from home. Pt c/o constipation for 2 weeks. States GI doc has been changing her meds, Pt c/o abd pain and nausea today. Pt is A&Ox4 and NAD.  EMS gave 4mg  of Zofran 

## 2024-08-15 NOTE — Discharge Instructions (Addendum)
 Your evaluation in the emergency department was overall reassuring, and your symptoms improved with treatment.  I have prescribed you a new nausea medication which you can use in addition to your usual regimen as needed.  You can also use Tylenol  as needed for any mild discomfort.  Please do follow-up with your gastroenterologist and primary care provider for reevaluation, and return to the emergency department with any new or worsening symptoms.

## 2024-08-20 ENCOUNTER — Encounter: Payer: Self-pay | Admitting: Family Medicine

## 2024-08-21 NOTE — Progress Notes (Signed)
 [facility] Refill Coordination Note    Specialty Medication(s) to be Shipped:   Inflammatory Disorders: mycophenolate     Other medication(s) to be shipped: No additional medications requested for fill at this time    Specialty Medications not needed at this time: N/A     Caitlin Smith, DOB: [birth year]  Phone: [phone] (home) [phone] (work)      All above HIPAA information was verified with patient.     Was a nurse, learning disability used for this call? No    Completed refill call assessment today to schedule patient's medication shipment from the [facility]  [phone]).  All relevant notes have been reviewed.     Specialty medication(s) and dose(s) confirmed: Regimen is correct and unchanged.   Changes to medications: [name] reports no changes at this time.  Changes to insurance: No  New side effects reported not previously addressed with a pharmacist or physician: None reported  Questions for the pharmacist: No    Confirmed patient received a Conservation Officer, Historic Buildings and a Surveyor, Mining with first shipment. The patient will receive a drug information handout for each medication shipped and additional FDA Medication Guides as required.       DISEASE/MEDICATION-SPECIFIC INFORMATION        N/A    SPECIALTY MEDICATION ADHERENCE     Medication Adherence    Patient reported X missed doses in the last month: 1  Specialty Medication: mycophenolate  360 MG Tbec (MYFORTIC )  Patient is on additional specialty medications: No              Were doses missed due to medication being on hold? No      mycophenolate  360 MG Tbec (MYFORTIC ): 14 days of medicine on hand       Specialty medication is an injection or given on a cycle: No    REFERRAL TO PHARMACIST     Referral to the pharmacist: Yes - routine compliance concerns. Patient has missed 1-3 doses of medication. Refills were scheduled and concern routed to pharmacist for evaluation.      SHIPPING     Shipping address confirmed in Epic.     Cost and Payment: Patient has a $0 copay, payment information is not required.    Delivery Scheduled: Yes, Expected medication delivery date: 12.30.25.     Medication will be delivered via Next Day Courier to the prescription address in Epic WAM.    [name]   [facility]  Specialty Technician

## 2024-08-21 NOTE — Progress Notes (Signed)
 This pharmacist was notified by a technician that this patient has reported that they've missed 1 doses of their mycophenolate .. I have reviewed the patient's medical record and have determined that no further pharmacist action is needed.      Approximate time spent: 0-5 minutes    Sherle Dire, PharmD, Clinical Specialty Pharmacist  [facility]

## 2024-08-21 NOTE — Progress Notes (Signed)
 The [facility] has made a second and final attempt to reach this patient to refill the following medication:mycophenolate  360 MG Tbec (MYFORTIC ).      We have been unable to leave messages on the following phone numbers: [phone], have sent a MyChart message, have sent a text message to the following phone numbers: [phone], and have sent a Mychart questionnaire..    Dates contacted: 12.11.25 and 12.17.25  Last scheduled delivery: 11.21.25    The patient may be at risk of non-compliance with this medication. The patient should call the [facility] at [phone]  Option 4, then Option 2: Dermatology, Gastroenterology, Rheumatology to refill medication.    [name]   [facility]

## 2024-08-27 NOTE — Telephone Encounter (Signed)
 Message sent to patient via mychart to go back to using Tacrolimus  ointment.

## 2024-09-02 ENCOUNTER — Ambulatory Visit (INDEPENDENT_AMBULATORY_CARE_PROVIDER_SITE_OTHER): Admitting: Family Medicine

## 2024-09-02 ENCOUNTER — Encounter: Payer: Self-pay | Admitting: Family Medicine

## 2024-09-02 VITALS — BP 130/76 | HR 69 | Resp 16 | Ht 67.0 in | Wt 174.0 lb

## 2024-09-02 DIAGNOSIS — R058 Other specified cough: Secondary | ICD-10-CM | POA: Diagnosis not present

## 2024-09-02 DIAGNOSIS — K5909 Other constipation: Secondary | ICD-10-CM | POA: Diagnosis not present

## 2024-09-02 DIAGNOSIS — M3213 Lung involvement in systemic lupus erythematosus: Secondary | ICD-10-CM

## 2024-09-02 DIAGNOSIS — E88819 Insulin resistance, unspecified: Secondary | ICD-10-CM

## 2024-09-02 DIAGNOSIS — E1142 Type 2 diabetes mellitus with diabetic polyneuropathy: Secondary | ICD-10-CM

## 2024-09-02 DIAGNOSIS — Z1231 Encounter for screening mammogram for malignant neoplasm of breast: Secondary | ICD-10-CM | POA: Diagnosis not present

## 2024-09-02 DIAGNOSIS — R634 Abnormal weight loss: Secondary | ICD-10-CM | POA: Diagnosis not present

## 2024-09-02 DIAGNOSIS — J8410 Pulmonary fibrosis, unspecified: Secondary | ICD-10-CM | POA: Diagnosis not present

## 2024-09-02 DIAGNOSIS — R110 Nausea: Secondary | ICD-10-CM | POA: Diagnosis not present

## 2024-09-02 DIAGNOSIS — F339 Major depressive disorder, recurrent, unspecified: Secondary | ICD-10-CM

## 2024-09-02 LAB — POCT GLYCOSYLATED HEMOGLOBIN (HGB A1C): Hemoglobin A1C: 5.4 % (ref 4.0–5.6)

## 2024-09-02 MED ORDER — AZITHROMYCIN 250 MG PO TABS: ORAL_TABLET | ORAL | 0 refills | Status: AC

## 2024-09-02 MED FILL — MYCOPHENOLATE SODIUM 360 MG TABLET,DELAYED RELEASE: ORAL | 30 days supply | Qty: 120 | Fill #3

## 2024-09-02 NOTE — Progress Notes (Signed)
 Name: Nicole Ballard   MRN: [id number]    DOB: Mar 08, 1956   Date:09/02/2024       Progress Note  Subjective  Chief Complaint  Chief Complaint  Patient presents with   Medical Management of Chronic Issues   Discussed the use of AI scribe software for clinical note transcription with the patient, who gave verbal consent to proceed.  History of Present Illness Nicole Ballard is a 68 year old female with post-inflammatory pulmonary fibrosis and lupus who presents with persistent nausea and weight loss.  She has been experiencing persistent nausea and weight loss despite taking medications such as metoclopramide , Zofran , and Prilosec. She continues to lose weight, having lost another three pounds recently. She has visited the emergency room multiple times due to severe nausea and underwent a CT scan of her abdomen, which showed no significant findings except for a small cyst on her kidney.  She has a history of post-inflammatory pulmonary fibrosis secondary to lupus, affecting her lungs. She takes Plaquenil and mycophenolate  for her lupus. Recently, she developed a light cough that progressed to coughing up mucus and hoarseness, along with a runny nose. She has been using Flonase  without relief and has visited the emergency room due to her symptoms, concerned about her lung health given her history of pulmonary fibrosis.  She has a history of type 2 diabetes, diabetic gastroparesis, and neuropathy. Her A1c is currently 5.4. No significant pain in her feet and she has been seeing a neurologist for her neuropathy. She regularly checks her feet to prevent complications.  She feels emotionally drained and somewhat depressed, attributing this to her ongoing health issues. She takes medication for depression and notes that her emotional state has been stable since her last visit in December.  She has had dental issues, including recent tooth extractions, which have contributed to her difficulty  eating.    Patient Active Problem List   Diagnosis Date Noted   Chronic pain of both shoulders 02/15/2024   Balance problem 02/01/2024   Diabetic polyneuropathy associated with type 2 diabetes mellitus (HCC) 02/01/2024   Lumbar facet arthropathy 10/02/2023   Genetic testing 03/14/2023   Abnormal SPEP 01/13/2023   Weight loss 01/13/2023   Family history of breast cancer 01/13/2023   Asthma, chronic, severe persistent, uncomplicated (HCC) 10/12/2022   Interstitial lung disease (HCC) 07/12/2022   Empty sella 07/12/2022   History of craniotomy 07/12/2022   Immunocompromised 04/29/2021   Hyperglycemia 10/23/2017   Vertigo 09/29/2016   Bronchiolitis obliterans organizing pneumonia (HCC) 09/29/2016   Neck pain, musculoskeletal 12/15/2015   Cough in adult 09/11/2015   Abnormal CAT scan 04/11/2015   Auditory impairment 04/11/2015   Insomnia, persistent 04/11/2015   Chronic kidney disease (CKD), stage I 04/11/2015   Chronic nonmalignant pain 04/11/2015   Chronic constipation 04/11/2015   Dyslipidemia 04/11/2015   Fibromyalgia 04/11/2015   Gastro-esophageal reflux disease without esophagitis 04/11/2015   Dysphonia 04/11/2015   Low back pain 04/11/2015   Eczema intertrigo 04/11/2015   Keratosis pilaris 04/11/2015   Chronic recurrent major depressive disorder 04/11/2015   Dysmetabolic syndrome 04/11/2015   Neurogenic claudication 04/11/2015   Perennial allergic rhinitis with seasonal variation 04/11/2015   Abnormal presence of protein in urine 04/11/2015   Seborrhea capitis 04/11/2015   Moderate dysplasia of vulva 04/11/2015   Vitamin D  deficiency 04/11/2015   Spinal stenosis of lumbar region 04/11/2015   Treadmill stress test negative for angina pectoris 03/05/2015   Shortness of breath 05/20/2014   H/O total  knee replacement 12/31/2013   Episcleritis 09/26/2013   Vocal cord dysfunction 05/28/2013   Anxiety, generalized 03/19/2013   Back pain 12/18/2012   Pulmonary nodules  11/26/2012   Lung nodule, multiple 11/26/2012   Arthritis, degenerative 11/01/2012   H/O aspiration pneumonitis 10/22/2012   Postinflammatory pulmonary fibrosis (HCC) 10/22/2012   Mixed incontinence 05/04/2012   Lung involvement in systemic lupus erythematosus (HCC) 11/05/2010   Chronic nausea 07/01/2008   Benign hypertension 01/25/2005    Past Surgical History:  Procedure Laterality Date   BRAIN SURGERY     CHOLECYSTECTOMY     VAGINAL HYSTERECTOMY     vulvar surgery      Family History  Problem Relation Age of Onset   Diabetes Mother    Hypertension Mother    Breast cancer Mother    Stomach cancer Father        d.>50   Breast cancer Maternal Aunt        dx >50   Breast cancer Maternal Aunt        dx 57s   Breast cancer Maternal Aunt    Cancer Maternal Uncle        unk type   Cancer Maternal Uncle        unk type   Breast cancer Cousin        dx 49s   Breast cancer Cousin        dx 30s, does not want genetic testing    Social History   Tobacco Use   Smoking status: Never   Smokeless tobacco: Never  Substance Use Topics   Alcohol use: No    Alcohol/week: 0.0 standard drinks of alcohol    Current Medications[1]  Allergies[2]  I personally reviewed active problem list, medication list, allergies, family history with the patient/caregiver today.   ROS  Ten systems reviewed and is negative except as mentioned in HPI    Objective Physical Exam CONSTITUTIONAL: Patient appears well-developed and well-nourished. No distress. HEENT: Head atraumatic, normocephalic, neck supple. CARDIOVASCULAR: Normal rate, regular rhythm and normal heart sounds. No murmur heard. No BLE edema. PULMONARY: Effort normal. Wheezing and crackles on right lower lung base. ABDOMINAL: There is no tenderness or distention. MUSCULOSKELETAL: Normal gait. Without gross motor or sensory deficit. PSYCHIATRIC: Patient has a normal mood and affect. Behavior is normal. Judgment and thought  content normal.  Vitals:   09/02/24 0847  BP: 130/76  Pulse: 69  Resp: 16  SpO2: 95%  Weight: 174 lb (78.9 kg)  Height: 5' 7 (1.702 m)    Body mass index is 27.25 kg/m.  Recent Results (from the past 2160 hours)  Urine Culture     Status: None   Collection Time: 06/25/24 12:00 AM   Specimen: Urine   Urine  Result Value Ref Range   Urine Culture, Routine Final report    Organism ID, Bacteria No growth   POCT Urinalysis Dipstick     Status: Abnormal   Collection Time: 06/25/24  2:15 PM  Result Value Ref Range   Color, UA Yellow    Clarity, UA Clear    Glucose, UA Negative Negative   Bilirubin, UA Small    Ketones, UA Negative    Spec Grav, UA 1.020 1.010 - 1.025   Blood, UA Moderate    pH, UA 6.0 5.0 - 8.0   Protein, UA Positive (A) Negative   Urobilinogen, UA 0.2 0.2 or 1.0 E.U./dL   Nitrite, UA Negative    Leukocytes, UA Moderate (2+) (A) Negative  Appearance     Odor    Comprehensive metabolic panel     Status: Abnormal   Collection Time: 08/07/24  7:36 AM  Result Value Ref Range   Sodium 141 135 - 145 mmol/L   Potassium 4.0 3.5 - 5.1 mmol/L   Chloride 105 98 - 111 mmol/L   CO2 26 22 - 32 mmol/L   Glucose, Bld 119 (H) 70 - 99 mg/dL    Comment: Glucose reference range applies only to samples taken after fasting for at least 8 hours.   BUN 7 (L) 8 - 23 mg/dL   Creatinine, Ser 9.15 0.44 - 1.00 mg/dL   Calcium  9.8 8.9 - 10.3 mg/dL   Total Protein 7.8 6.5 - 8.1 g/dL   Albumin 4.2 3.5 - 5.0 g/dL   AST 22 15 - 41 U/L   ALT 7 0 - 44 U/L   Alkaline Phosphatase 103 38 - 126 U/L   Total Bilirubin 0.5 0.0 - 1.2 mg/dL   GFR, Estimated >39 >39 mL/min    Comment: (NOTE) Calculated using the CKD-EPI Creatinine Equation (2021)    Anion gap 10 5 - 15    Comment: Performed at [facility], [address] [place], [place] [address]  Lipase, blood     Status: None   Collection Time: 08/07/24  7:36 AM  Result Value Ref Range   Lipase 17 11 - 51 U/L     Comment: Performed at [facility], [address] [place], [place] [address]  CBC with Differential     Status: None   Collection Time: 08/07/24  7:36 AM  Result Value Ref Range   WBC 4.2 4.0 - 10.5 K/uL   RBC 4.96 3.87 - 5.11 MIL/uL   Hemoglobin 13.5 12.0 - 15.0 g/dL   HCT 57.1 63.9 - 53.9 %   MCV 86.3 80.0 - 100.0 fL   MCH 27.2 26.0 - 34.0 pg   MCHC 31.5 30.0 - 36.0 g/dL   RDW 86.4 88.4 - 84.4 %   Platelets 306 150 - 400 K/uL   nRBC 0.0 0.0 - 0.2 %   Neutrophils Relative % 44 %   Neutro Abs 1.9 1.7 - 7.7 K/uL   Lymphocytes Relative 37 %   Lymphs Abs 1.6 0.7 - 4.0 K/uL   Monocytes Relative 14 %   Monocytes Absolute 0.6 0.1 - 1.0 K/uL   Eosinophils Relative 2 %   Eosinophils Absolute 0.1 0.0 - 0.5 K/uL   Basophils Relative 2 %   Basophils Absolute 0.1 0.0 - 0.1 K/uL   Immature Granulocytes 1 %   Abs Immature Granulocytes 0.02 0.00 - 0.07 K/uL    Comment: Performed at [facility], [address] [place], [place] [address]  Urinalysis, Routine w reflex microscopic -Urine, Clean Catch     Status: Abnormal   Collection Time: 08/07/24  7:36 AM  Result Value Ref Range   Color, Urine AMBER (A) YELLOW    Comment: BIOCHEMICALS MAY BE AFFECTED BY COLOR   APPearance HAZY (A) CLEAR   Specific Gravity, Urine 1.023 1.005 - 1.030   pH 5.0 5.0 - 8.0   Glucose, UA NEGATIVE NEGATIVE mg/dL   Hgb urine dipstick NEGATIVE NEGATIVE   Bilirubin Urine NEGATIVE NEGATIVE   Ketones, ur NEGATIVE NEGATIVE mg/dL   Protein, ur 899 (A) NEGATIVE mg/dL   Nitrite NEGATIVE NEGATIVE   Leukocytes,Ua NEGATIVE NEGATIVE   RBC / HPF 0-5 0 - 5 RBC/hpf   WBC, UA 0-5 0 -  5 WBC/hpf   Bacteria, UA RARE (A) NONE SEEN   Squamous Epithelial / HPF 0-5 0 - 5 /HPF   Mucus PRESENT    Hyaline Casts, UA PRESENT     Comment: Performed at [facility], [address] [place], [place] [address]  Lipase, blood     Status: None   Collection Time: 08/15/24  3:46 PM  Result Value Ref Range    Lipase 16 11 - 51 U/L    Comment: Performed at [facility], [address] [place], [place] [address]  Comprehensive metabolic panel     Status: Abnormal   Collection Time: 08/15/24  3:46 PM  Result Value Ref Range   Sodium 138 135 - 145 mmol/L   Potassium 4.6 3.5 - 5.1 mmol/L   Chloride 101 98 - 111 mmol/L   CO2 27 22 - 32 mmol/L   Glucose, Bld 113 (H) 70 - 99 mg/dL    Comment: Glucose reference range applies only to samples taken after fasting for at least 8 hours.   BUN 8 8 - 23 mg/dL   Creatinine, Ser 9.18 0.44 - 1.00 mg/dL   Calcium  9.1 8.9 - 10.3 mg/dL   Total Protein 7.9 6.5 - 8.1 g/dL   Albumin 4.2 3.5 - 5.0 g/dL   AST 22 15 - 41 U/L   ALT 13 0 - 44 U/L   Alkaline Phosphatase 99 38 - 126 U/L   Total Bilirubin 0.5 0.0 - 1.2 mg/dL   GFR, Estimated >39 >39 mL/min    Comment: (NOTE) Calculated using the CKD-EPI Creatinine Equation (2021)    Anion gap 10 5 - 15    Comment: Performed at [facility], [address] [place], [place] [address]  CBC     Status: Abnormal   Collection Time: 08/15/24  3:46 PM  Result Value Ref Range   WBC 3.8 (L) 4.0 - 10.5 K/uL   RBC 5.18 (H) 3.87 - 5.11 MIL/uL   Hemoglobin 13.9 12.0 - 15.0 g/dL   HCT 55.8 63.9 - 53.9 %   MCV 85.1 80.0 - 100.0 fL   MCH 26.8 26.0 - 34.0 pg   MCHC 31.5 30.0 - 36.0 g/dL   RDW 86.7 88.4 - 84.4 %   Platelets 344 150 - 400 K/uL   nRBC 0.0 0.0 - 0.2 %    Comment: Performed at [facility], [address] [place], [place] [address]  Urinalysis, Routine w reflex microscopic -Urine, Clean Catch     Status: Abnormal   Collection Time: 08/15/24  3:46 PM  Result Value Ref Range   Color, Urine YELLOW (A) YELLOW   APPearance CLEAR (A) CLEAR   Specific Gravity, Urine 1.024 1.005 - 1.030   pH 6.0 5.0 - 8.0   Glucose, UA NEGATIVE NEGATIVE mg/dL   Hgb urine dipstick NEGATIVE NEGATIVE   Bilirubin Urine NEGATIVE NEGATIVE   Ketones, ur 20 (A) NEGATIVE mg/dL   Protein, ur 30 (A) NEGATIVE  mg/dL   Nitrite NEGATIVE NEGATIVE   Leukocytes,Ua TRACE (A) NEGATIVE   RBC / HPF 0-5 0 - 5 RBC/hpf   WBC, UA 0-5 0 - 5 WBC/hpf   Bacteria, UA NONE SEEN NONE SEEN   Squamous Epithelial / HPF 0-5 0 - 5 /HPF   Mucus PRESENT     Comment: Performed at [facility], [address] [place], [place] [address]  POCT glycosylated hemoglobin (Hb A1C)     Status: None   Collection Time: 09/02/24  9:12 AM  Result Value Ref Range   Hemoglobin A1C 5.4 4.0 - 5.6 %   HbA1c POC (<> result, manual entry)     HbA1c, POC (prediabetic range)     HbA1c, POC (controlled diabetic range)      Diabetic Foot Exam:  Title   Diabetic Foot Exam - detailed Visual Foot Exam completed.: Yes   Pulse Foot Exam completed.: Yes      Sensory Foot Exam Completed.: Yes Semmes-Weinstein Monofilament Test + means has sensation and - means no sensation      Image components are not supported.   Image components are not supported. Image components are not supported.  [place] Comments Long toe nails. Failed monofilament test      PHQ2/9:    09/02/2024    8:48 AM 08/06/2024    3:13 PM 07/24/2024    1:22 PM 06/25/2024    2:14 PM 05/03/2024    9:48 AM  Depression screen PHQ 2/9  Decreased Interest 2 2 2 2 1   Down, Depressed, Hopeless 1 1 1 2 1   PHQ - 2 Score 3 3 3 4 2   Altered sleeping 3 3 3 2 1   Tired, decreased energy 2 2 2 2 1   Change in appetite 2 2 2 1 1   Feeling bad or failure about yourself  1 1 1 1  0  Trouble concentrating 1 1 1 2 1   Moving slowly or fidgety/restless 1 1 1   0  Suicidal thoughts 0 0 0 0 0  PHQ-9 Score 13 13 13 12  6    Difficult doing work/chores Very difficult Very difficult  Somewhat difficult Somewhat difficult     Data saved with a previous flowsheet row definition    phq 9 is positive  Fall Risk:    09/02/2024    8:43 AM 08/06/2024    3:13 PM 06/25/2024    2:14 PM 05/03/2024   11:10 AM 05/03/2024    9:43 AM  Fall Risk   Falls in the past year?  0 0 0 0 0  Number falls in past yr: 0 0 0 0 0  Injury with Fall? 0 0 0  0  0   Risk for fall due to : No Fall Risks No Fall Risks No Fall Risks No Fall Risks No Fall Risks  Follow up Falls evaluation completed Falls evaluation completed  Falls evaluation completed Falls evaluation completed     Data saved with a previous flowsheet row definition     Assessment & Plan Acute productive cough with possible pneumonia Acute productive cough with hoarseness and wheezing, likely due to pneumonia. High risk of complications due to underlying lung disease. Empirical treatment initiated. - Prescribed azithromycin  (Z-Pak). - Advised gargling with warm salted water.  Postinflammatory pulmonary fibrosis with lung involvement in systemic lupus erythematosus Chronic lung involvement secondary to systemic lupus erythematosus, increasing risk of respiratory infections. - Continue current management for lupus and lung involvement.  Type 2 diabetes mellitus complicated by diabetic polyneuropathy and gastroparesis Type 2 diabetes with diabetic polyneuropathy and gastroparesis. A1c at 5.4. Metformin  discontinued due to gastrointestinal symptoms. - Discontinued metformin . - Continue current management for diabetic neuropathy and gastroparesis. - Advised on dietary modifications including small, frequent meals and bland diet.  Chronic recurrent major depressive disorder Chronic major depressive disorder with moderate symptoms exacerbated by recent illness and weight loss. - Continue current management for depression.  Unintentional weight loss and chronic nausea Chronic nausea and unintentional weight loss, possibly related to  medication side effects and gastroparesis. CT scan showed no major abnormalities. - Advised taking medications with food. - Recommended bland diet with small, frequent meals. - Encouraged consumption of high-calorie foods like prunes with cottage cheese or Greek yogurt.  Chronic  constipation Managed with Linzess , recently adjusted by a specialist. - Continue Linzess  as prescribed.        [1]  Current Outpatient Medications:    albuterol  (VENTOLIN  HFA) 108 (90 Base) MCG/ACT inhaler, SMARTSIG:2 Puff(s) By Mouth Every 6 Hours PRN, Disp: , Rfl:    amLODipine  (NORVASC ) 2.5 MG tablet, Take 3 tablets (7.5 mg total) by mouth daily., Disp: 270 tablet, Rfl: 0   aspirin  EC 81 MG tablet, Take 1 tablet (81 mg total) by mouth daily., Disp: 30 tablet, Rfl: 0   atenolol  (TENORMIN ) 25 MG tablet, Take 1 tablet by mouth in the morning and at bedtime., Disp: , Rfl:    atorvastatin  (LIPITOR) 40 MG tablet, TAKE 1 TABLET BY MOUTH DAILY, Disp: 100 tablet, Rfl: 2   azithromycin  (ZITHROMAX ) 250 MG tablet, Take 2 tablets on day 1, then 1 tablet daily on days 2 through 5, Disp: 6 tablet, Rfl: 0   Biotin  1 MG CAPS, Take 1 capsule by mouth daily., Disp: 30 capsule, Rfl: 0   bisacodyl (DULCOLAX) 5 MG EC tablet, Take 1 tablet (5 mg total) by mouth daily as needed for moderate constipation., Disp: 6 tablet, Rfl: 0   buPROPion  (WELLBUTRIN  XL) 300 MG 24 hr tablet, Take 300 mg by mouth in the morning., Disp: , Rfl:    Cholecalciferol (VITAMIN D ) 2000 UNITS CAPS, Take 1 capsule by mouth daily., Disp: , Rfl:    clobetasol ointment (TEMOVATE) 0.05 %, Apply 1 application topically 2 (two) times daily., Disp: , Rfl:    clonazePAM  (KLONOPIN ) 0.5 MG tablet, Take 0.25 mg by mouth at bedtime., Disp: , Rfl:    cyclobenzaprine  (FLEXERIL ) 5 MG tablet, Take by mouth., Disp: , Rfl:    diclofenac  Sodium (VOLTAREN ) 1 % GEL, Apply topically daily as needed., Disp: , Rfl:    famotidine  (PEPCID ) 20 MG tablet, Take 20 mg by mouth daily., Disp: , Rfl:    fluticasone  (FLONASE ) 50 MCG/ACT nasal spray, Place 2 sprays into both nostrils daily., Disp: 48 g, Rfl: 3   hydrALAZINE  (APRESOLINE ) 25 MG tablet, Take 1 tablet (25 mg total) by mouth 4 (four) times daily as needed (For sBP>150 and dBP>90)., Disp: 90 tablet, Rfl: 0    hydroxychloroquine (PLAQUENIL) 200 MG tablet, Take by mouth 2 (two) times daily., Disp: , Rfl:    hydrOXYzine  (ATARAX ) 10 MG tablet, Take 1 tablet (10 mg total) by mouth 3 (three) times daily as needed., Disp: 90 tablet, Rfl: 0   lamoTRIgine  (LAMICTAL ) 200 MG tablet, Take 200 mg by mouth at bedtime., Disp: , Rfl:    lidocaine  (XYLOCAINE ) 5 % ointment, Apply 1 Application topically 4 (four) times daily as needed., Disp: , Rfl:    LINZESS  72 MCG capsule, Take 1 capsule on an empty stomach at least 30 minutes prior to a meal at the same time every day., Disp: , Rfl:    loratadine  (CLARITIN ) 10 MG tablet, Take 1 tablet (10 mg total) by mouth daily., Disp: 90 tablet, Rfl: 1   Melatonin 10 MG TABS, Take 1 tablet by mouth at bedtime., Disp: , Rfl:    metoCLOPramide  (REGLAN ) 10 MG tablet, Take 1 tablet (10 mg total) by mouth every 8 (eight) hours as needed for refractory nausea / vomiting., Disp: 90  tablet, Rfl: 0   mycophenolate  (MYFORTIC) 360 MG TBEC EC tablet, Take 720 mg by mouth 2 (two) times daily., Disp: , Rfl:    olopatadine  (PATANOL) 0.1 % ophthalmic solution, Place 1 drop into both eyes 2 (two) times daily., Disp: 5 mL, Rfl: 1   omeprazole  (PRILOSEC) 40 MG capsule, Take 1 capsule by mouth in the morning and at bedtime. Pulmonologist, Disp: , Rfl:    ondansetron  (ZOFRAN ) 4 MG tablet, Take 1 tablet (4 mg total) by mouth every 8 (eight) hours as needed for nausea or vomiting., Disp: 20 tablet, Rfl: 0   pregabalin (LYRICA) 75 MG capsule, Take 75 mg by mouth 2 (two) times daily., Disp: , Rfl:    Prucalopride Succinate (MOTEGRITY) 2 MG TABS, Take 1 tablet by mouth daily., Disp: , Rfl:    QVAR REDIHALER 80 MCG/ACT inhaler, Inhale 2 puffs into the lungs., Disp: , Rfl:    Spacer/Aero-Holding Chambers (AEROCHAMBER MV) inhaler, 1 each by Other route., Disp: , Rfl:    telmisartan  (MICARDIS ) 80 MG tablet, TAKE 1 TABLET BY MOUTH DAILY, Disp: 100 tablet, Rfl: 2   traZODone (DESYREL) 100 MG tablet, Take 150 mg by  mouth at bedtime., Disp: , Rfl:    triamcinolone  ointment (KENALOG ) 0.1 %, , Disp: , Rfl:  [2]  Allergies Allergen Reactions   Ace Inhibitors     Other reaction(s): OTHER Pt states she can not take ace inhibitors. Pt states she can not remember her reaction.    Bee Pollen    Penicillins    Pollen Extract

## 2024-09-03 ENCOUNTER — Ambulatory Visit: Payer: Self-pay | Admitting: Family Medicine

## 2024-09-03 ENCOUNTER — Emergency Department
Admit: 2024-09-03 | Discharge: 2024-09-03 | Disposition: A | Discharge status: Home / Self Care | Payer: Medicare (Managed Care)

## 2024-09-03 DIAGNOSIS — R112 Nausea with vomiting, unspecified: Principal | ICD-10-CM

## 2024-09-03 LAB — URINALYSIS WITH MICROSCOPY WITH CULTURE REFLEX PERFORMABLE
BACTERIA: NONE SEEN /HPF
BILIRUBIN UA: NEGATIVE
BLOOD UA: NEGATIVE
GLUCOSE UA: NEGATIVE
KETONES UA: 40 — AB
NITRITE UA: NEGATIVE
PH UA: 7 (ref 5.0–9.0)
RBC UA: 1 /HPF (ref <=4)
SPECIFIC GRAVITY UA: 1.016 (ref 1.003–1.030)
SQUAMOUS EPITHELIAL: <1 /HPF (ref 0–5)
UROBILINOGEN UA: <2
WBC UA: <1 /HPF (ref 0–5)

## 2024-09-03 LAB — CBC W/ AUTO DIFF
BASOPHILS ABSOLUTE COUNT: 0.1 10*9/L (ref 0.0–0.1)
BASOPHILS RELATIVE PERCENT: 0.8 %
EOSINOPHILS ABSOLUTE COUNT: 0.1 10*9/L (ref 0.0–0.5)
EOSINOPHILS RELATIVE PERCENT: 0.8 %
HEMATOCRIT: 43 % (ref 34.0–44.0)
HEMOGLOBIN: 14.2 g/dL (ref 11.3–14.9)
LYMPHOCYTES ABSOLUTE COUNT: 1.4 10*9/L (ref 1.1–3.6)
LYMPHOCYTES RELATIVE PERCENT: 22.9 %
MEAN CORPUSCULAR HEMOGLOBIN CONC: 33 g/dL (ref 32.0–36.0)
MEAN CORPUSCULAR HEMOGLOBIN: 27.5 pg (ref 25.9–32.4)
MEAN CORPUSCULAR VOLUME: 83.4 fL (ref 77.6–95.7)
MEAN PLATELET VOLUME: 7.2 fL (ref 6.8–10.7)
MONOCYTES ABSOLUTE COUNT: 0.7 10*9/L (ref 0.3–0.8)
MONOCYTES RELATIVE PERCENT: 10.3 %
NEUTROPHILS ABSOLUTE COUNT: 4.1 10*9/L (ref 1.8–7.8)
NEUTROPHILS RELATIVE PERCENT: 65.2 %
NUCLEATED RED BLOOD CELLS: 0 /100{WBCs} (ref <=4)
PLATELET COUNT: 361 10*9/L (ref 150–450)
RED BLOOD CELL COUNT: 5.15 10*12/L — ABNORMAL HIGH (ref 3.95–5.13)
RED CELL DISTRIBUTION WIDTH: 14.8 % (ref 12.2–15.2)
WBC ADJUSTED: 6.3 10*9/L (ref 3.6–11.2)

## 2024-09-03 LAB — COMPREHENSIVE METABOLIC PANEL
ALBUMIN: 4 g/dL (ref 3.4–5.0)
ALKALINE PHOSPHATASE: 112 U/L (ref 46–116)
ALT (SGPT): 14 U/L (ref 10–49)
ANION GAP: 16 mmol/L — ABNORMAL HIGH (ref 5–14)
AST (SGOT): 23 U/L (ref <=34)
BILIRUBIN TOTAL: 0.6 mg/dL (ref 0.3–1.2)
BLOOD UREA NITROGEN: 6 mg/dL — ABNORMAL LOW (ref 9–23)
BUN / CREAT RATIO: 8
CALCIUM: 10.1 mg/dL (ref 8.7–10.4)
CHLORIDE: 99 mmol/L (ref 98–107)
CO2: 28.9 mmol/L (ref 20.0–31.0)
CREATININE: 0.73 mg/dL (ref 0.55–1.02)
EGFR CKD-EPI (2021) FEMALE: >90 mL/min/1.73m2 (ref >=60)
GLUCOSE RANDOM: 89 mg/dL (ref 70–179)
POTASSIUM: 4.1 mmol/L (ref 3.4–4.8)
PROTEIN TOTAL: 8.4 g/dL — ABNORMAL HIGH (ref 5.7–8.2)
SODIUM: 144 mmol/L (ref 135–145)

## 2024-09-03 LAB — LIPASE: LIPASE: 26 U/L (ref 12–53)

## 2024-09-03 LAB — LIPID PANEL
Cholesterol: 122 mg/dL
HDL: 55 mg/dL
LDL Cholesterol (Calc): 53 mg/dL
Non-HDL Cholesterol (Calc): 67 mg/dL
Total CHOL/HDL Ratio: 2.2 (calc)
Triglycerides: 67 mg/dL

## 2024-09-03 LAB — MICROALBUMIN / CREATININE URINE RATIO
Creatinine, Urine: 112 mg/dL (ref 20–275)
Microalb Creat Ratio: 13 mg/g{creat}
Microalb, Ur: 1.5 mg/dL

## 2024-09-03 MED ORDER — PROMETHAZINE 25 MG TABLET
ORAL_TABLET | Freq: Four times a day (QID) | ORAL | 0 refills | 8.00000 days | Status: CP | PRN
Start: 2024-09-03 — End: ?

## 2024-09-03 MED ADMIN — methylPREDNISolone sodium succinate (SOLU-Medrol) injection 125 mg: 125 mg | INTRAVENOUS | @ 17:00:00 | Stop: 2024-09-03

## 2024-09-03 MED ADMIN — ondansetron (ZOFRAN) injection 4 mg: 4 mg | INTRAVENOUS | @ 15:00:00 | Stop: 2024-09-03

## 2024-09-03 MED ADMIN — sodium chloride 0.9% (NS) bolus 1,000 mL: 1000 mL | INTRAVENOUS | @ 16:00:00 | Stop: 2024-09-03

## 2024-09-03 MED ADMIN — pantoprazole (Protonix) injection 40 mg: 40 mg | INTRAVENOUS | @ 17:00:00 | Stop: 2024-09-03

## 2024-09-03 MED ADMIN — sodium chloride 0.9% (NS) bolus 1,000 mL: 1000 mL | INTRAVENOUS | @ 15:00:00 | Stop: 2024-09-03

## 2024-09-03 MED ADMIN — prochlorperazine (COMPAZINE) injection 5 mg: 5 mg | INTRAVENOUS | @ 16:00:00 | Stop: 2024-09-03

## 2024-09-03 NOTE — ED Triage Note (Signed)
 Pt coming in POV for emesis x 50month.   - States PCP changed her nausea meds.
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
## 2024-09-09 ENCOUNTER — Other Ambulatory Visit: Payer: Self-pay | Admitting: Family Medicine

## 2024-09-09 DIAGNOSIS — F419 Anxiety disorder, unspecified: Secondary | ICD-10-CM

## 2024-09-09 NOTE — Telephone Encounter (Signed)
 Pre called and completed triage. Confirmed appointment time,locationa and details.

## 2024-09-10 MED ORDER — LINZESS 145 MCG CAPSULE
ORAL_CAPSULE | ORAL | 3 refills | 0.00000 days | Status: CP
Start: 2024-09-10 — End: ?

## 2024-09-10 NOTE — Telephone Encounter (Signed)
 Requested Prescriptions  Pending Prescriptions Disp Refills   hydrOXYzine  (ATARAX ) 10 MG tablet [Pharmacy Med Name: HYDROXYZINE  HCL 10 MG TABLET] 270 tablet 1    Sig: TAKE 1 TABLET BY MOUTH THREE TIMES A DAY AS NEEDED     Ear, Nose, and Throat:  Antihistamines 2 Passed - 09/10/2024  2:04 PM      Passed - Cr in normal range and within 360 days    Creat  Date Value Ref Range Status  04/10/2023 0.85 0.50 - 1.05 mg/dL Final   Creatinine, Ser  Date Value Ref Range Status  08/15/2024 0.81 0.44 - 1.00 mg/dL Final   Creatinine, Urine  Date Value Ref Range Status  09/02/2024 112 20 - 275 mg/dL Final         Passed - Valid encounter within last 12 months    Recent Outpatient Visits           1 week ago Postinflammatory pulmonary fibrosis [facility])   [facility] Glenard Mire, MD   1 month ago Unintentional weight loss   [facility] Glenard Mire, MD   1 month ago Chronic constipation   [facility] Gareth Mliss FALCON, FNP   2 months ago Urinary symptom or sign   [facility] Olivarez, Lauraine SAILOR, DO   4 months ago Benign hypertension   [facility] Sowles, Krichna, MD
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
## 2024-09-10 NOTE — Patient Instructions (Addendum)
 DIVISION OF GASTROENTEROLOGY AND HEPATOLOGY  [email or web address]      You were seen by Rollo Conger DMSc, MPAS, PA-C for   Problem List Items Addressed This Visit    None  Visit Diagnoses         Nausea    -  Primary    Relevant Orders    EGD             Call to schedule an upper endoscopy - [phone]  Increase Linzess  to 145 mcg once daily    It was my pleasure seeing you today in the [facility] G[facility]enterology department.     It is important that you remain active in your healthcare.  If further work-up, tests or consultations are recommended, you should be contacted to get these scheduled.  If for any reason, you do not hear from someone within 7 days to get these scheduled you should reach out to me via [facility] MyChart or at one of the numbers below and together we'll ensure you get the appropriate appointments.       Notes and/or results from recommended tests or consultation are to be sent to me as the ordering provider.  After I receive these results, I will follow-up with you as appropriate by [facility] MyChart, phone, letter or at your scheduled follow-up clinic appointment.  If for whatever reason you do not hear from me within 7-10 days after you've completed any tests/work-up, but do require further recommendations and/or instructions based on the results prior to a scheduled follow-up clinic visit, please contact me.     Please do not hesitate to reach out to me if you have questions or concerns in the interim.      Mychart is a convenient modality to reach your provider, however this is not meant to be for emergencies and is not checked on a daily basis.  If you have urgent questions, please call my nurse, Suzen Belling, at [phone].     Rollo FELIX Conger DMSc, MPAS, PA-C  [facility]  [facility] [employer]  at [facility]  [address]  [place], [place] [address]  Phone: [phone]  Fax: [phone]     Suzen Belling, MSN, RN: [phone] Fax: [phone]     Important phone numbers:    GI Clinic Appointments and GI Procedure Appointments:  [phone]  To schedule, reschedule, or cancel your GI clinic or procedure appointment, please call [phone]. If you are unable to come to an appointment, please notify us  as soon as possible, preferably 24 hours in advance. This allows other patients with urgent needs to be cared for quicker.     Radiology: [phone], option 3 or 4     For emergencies after normal business hours or on weekends/holidays   If you are experiencing a medical emergency or need urgent medical care, call 911 immediately.    For GI medical needs after hours or on holidays, please call [phone] and ask to speak to the Gastroenterology Fellow on call.    Forms, Letters, Medical Records: [phone]   [email or web address]    FINANCIAL ASSISTANCE:   [email or web address]   [phone] (toll-free)   [phone]    Infusion Center at [facility]:   [phone]

## 2024-09-10 NOTE — Progress Notes (Signed)
 EGD  Procedure #1     Procedure #2   [id number]  MRN   Generic   Endoscopist   TRUE  Is the patient's health insurance [employer], Aetna[employer], United Healthcare (UHC), UHC [employer] [place], [employer], or Cigna?     Urgent procedure     Are you pregnant? (Ignore if [facility] GI provider has OK'd procedure in order comments despite pregnancy)     Do you have chest pain with physical activity or are you in the process of scheduling or awaiting results of a heart ultrasound, stress test, or catheterization to evaluate chest pain, dizziness, or shortness of breath?     Do you have achalasia or gastroparesis or take Mounjaro, Zepbound, Raymond, Trulicity, Ozempic, Victoza, Saxenda, Byetta, Bydureon, Rybelsus, or Adlyxin?      Do you take: Plavix (clopidogrel), Coumadin (warfarin), Lovenox (enoxaparin), Pradaxa (dabigatran), Effient (prasugrel), Xarelto (rivaroxaban), Eliquis (apixaban), Pletal (cilostazol), or Brilinta (ticagrelor)?          Did ordering provider indicate how long to hold this medication in the order comments?          Which of the above medications are you taking?          What is the name of the medical practice that manages this medication?          What is the name of the medical provider who manages this medication?     Do you have hemophilia, von Willebrand disease, or low platelets?     Do you have a pacemaker or implanted cardiac defibrillator?     Has a [facility] provider specified the location(s)?     Which location(s) did the [facility] provider specify?          [facility]          [place]          [facility]      Do you see a liver specialist for chronic liver disease?     Is the procedure indication for variceal screening?     Is procedure indication for variceal banding (this does NOT include variceal screening)?     Have you had a heart attack, stroke or heart stent placement within the past 6 months?     Month of event     Year of event (ONLY ENTER LAST 2 DIGITS)        5  Height (feet)   7  Height (inches)   176  Weight (pounds)   27.6  BMI          Did the ordering provider specify a bowel prep?          What bowel prep was specified?     Do you have an ostomy (bag on your stomach that collects your stool)?          Is it an ileostomy?          Is it a colostomy?          Patient doesn't know.     Do you have chronic kidney disease?     Do you have chronic constipation or have you had poor quality bowel preps for past colonoscopies?     Do you have Crohn's disease or ulcerative colitis?     Have you had weight loss surgery?          Do you ever use supplemental oxygen?     Have you been hospitalized for cirrhosis of the liver or heart failure in  the last 12 months?     Have you been treated for mouth or throat cancer with radiation or surgery?     Have you been told that it is difficult for doctors to insert a breathing tube in you during anesthesia?     Have you had, or are you being considered for, a heart or lung transplant?          Are you on dialysis?     Have you started dialysis in the last 6 months?     Do you have cirrhosis of the liver?     Do you have myasthenia gravis?     Is the patient a prisoner?          Do you have obstructive sleep apnea?   TRUE  Have you previously received propofol  sedation by an anesthesiologist for a GI procedure?     Do you have more than 3 drinks of alcohol per day on average?     Do you regularly take prescription medications for chronic pain?     Do you regularly take Ativan, Klonopin , Xanax, Valium, lorazepam, clonazepam , alprazolam, or diazepam?     Have you previously had difficulty with sedation during a GI procedure?     Are you allergic to fentanyl, midazolam, or Versed?     Do you take medications for HIV?   ################# ## ###################################################################################################################   MRN:  [id number]   Anticoag Review  No   Nurse Triage  No   Procedure(s):  EGD     0   Endoscopist:  Generic Urgent:  No   Prep:                              --------------------------- --- ----------------------------------------------------------------------------------------------------------------------------------------------------------------------------   [facility] Locations:  [facility]   [facility]  [facility]      [place]             Requested Locations:              ################# ## ###################################################################################################################

## 2024-09-13 ENCOUNTER — Encounter: Admit: 2024-09-13 | Discharge: 2024-09-13 | Payer: Medicare (Managed Care)

## 2024-09-13 DIAGNOSIS — R7303 Prediabetes: Principal | ICD-10-CM

## 2024-09-13 DIAGNOSIS — G44229 Chronic tension-type headache, not intractable: Principal | ICD-10-CM

## 2024-09-13 DIAGNOSIS — G629 Polyneuropathy, unspecified: Principal | ICD-10-CM

## 2024-09-13 DIAGNOSIS — M48061 Spinal stenosis, lumbar region without neurogenic claudication: Principal | ICD-10-CM

## 2024-09-13 DIAGNOSIS — M150 Primary generalized (osteo)arthritis: Principal | ICD-10-CM

## 2024-09-13 DIAGNOSIS — G894 Chronic pain syndrome: Principal | ICD-10-CM

## 2024-09-13 DIAGNOSIS — M797 Fibromyalgia: Principal | ICD-10-CM

## 2024-09-13 LAB — VITAMIN B12: VITAMIN B-12: 485 pg/mL (ref 211–911)

## 2024-09-13 LAB — T4, FREE: FREE T4: 0.95 ng/dL (ref 0.89–1.76)

## 2024-09-13 LAB — MONOCLONAL GAMMOPATHY CHEMISTRIES
GAMMAGLOBULIN; IGA: 356.3 mg/dL (ref 70.0–400.0)
GAMMAGLOBULIN; IGG: 1698 mg/dL — ABNORMAL HIGH (ref 650–1600)
GAMMAGLOBULIN; IGM: 187 mg/dL (ref 40–230)
PROTEIN TOTAL (SPECIAL CHEM): 7.5 g/dL (ref 5.7–8.2)

## 2024-09-13 LAB — TSH: THYROID STIMULATING HORMONE: 0.744 u[IU]/mL (ref 0.550–4.780)

## 2024-09-13 LAB — FOLATE: FOLATE: 13.9 ng/mL (ref >=5.4)

## 2024-09-13 LAB — CERULOPLASMIN: CERULOPLASMIN: 30 mg/dL (ref 15.0–52.0)

## 2024-09-13 MED ORDER — CYCLOBENZAPRINE 10 MG TABLET
ORAL_TABLET | Freq: Every evening | ORAL | 3 refills | 90.00000 days | Status: CP
Start: 2024-09-13 — End: 2025-09-13

## 2024-09-13 NOTE — Patient Instructions (Addendum)
 It was a pleasure to see you today.     Increase Flexeril  to 10mg  (2 tablets) at night. You have a new prescription for 10mg  tablets at the pharmacy.     Call Pain Management to set an appointment. [phone]     We will schedule a skin biopsy to look for small fiber neuropathy.     I will be in touch with your labs.     We will get you scheduled with the Movement disorders clinic for your facial spasm.     Follow up: Return in about 6 months (around 03/13/2025) for RETURN HEADACHE any location with me.     If you have any questions in between visits, please send a message via MyChart or you can call our office. MyChart and the Clinic Triage are for non-urgent medical questions. If you are experiencing a medical emergency, please proceed to the closest Emergency Department.     Lauraine Kitty MSN, APRN, AGPCNP-BC  Nurse Practitioner, Comprehensive Neurology  [facility] Department of Neurology  [address] [place], [place] [address]  Phone: [phone]   Fax: [phone]

## 2024-09-13 NOTE — Progress Notes (Cosign Needed)
 [facility] General Neurology Clinic Summary       [address] [place]  [address] [place] [address]  [phone]    Date: 09/13/2024  Patient Name: Caitlin Smith  MRN: [id number]  PCP: Glenard Dorette Joette Freddrick, None Per Patient            Caitlin Smith is a 69 y.o.  female seen in consultation at the [facility] at the request of Dr. Freddrick for evaluation.        Assessment & Plan:   Assessment     Caitlin Smith is a 69 y.o.  female seen in consultation for the following problems:   Diagnosis ICD-10-CM Associated Orders   1. Neuropathy  G62.9 Epidermal Nerve Fiber Density     cyclobenzaprine  (FLEXERIL ) 10 MG tablet     T4, Free     TSH     Vitamin A     Vitamin B1, Whole Blood     Vitamin B12 Level     Vitamin B2     Vitamin E     Zinc Level, Serum     Ceruloplasmin     Copper, Serum     Vitamin B6     Monoclonal Gammopathy Workup     Encephalopathy, Autoimmune/Paraneoplastic Eval, Serum     Folate Level     T4, Free     TSH     Vitamin A     Vitamin B1, Whole Blood     Vitamin B12 Level     Vitamin B2     Vitamin E     Zinc Level, Serum     Ceruloplasmin     Copper, Serum     Vitamin B6     Monoclonal Gammopathy Workup     Encephalopathy, Autoimmune/Paraneoplastic Eval, Serum     Folate Level      6. Prediabetes  R73.03 T4, Free     TSH     T4, Free     TSH      7. Chronic tension-type headache, not intractable  G44.229           Assessment & Plan  Chronic headache  Chronic headaches since right micro-decompression surgery for hemifacial spam, with bilateral temporal and frontal pain, severity 7/10, lasting several hours, occurring approximately 10 times monthly. Associated symptoms include nausea, blurry vision, and facial spasm on the right side. No photophobia, phonophobia, or osmophobia. Current treatment with cyclobenzaprine  (Flexeril ) 5 mg PRN is insufficient.  - Increased cyclobenzaprine  to 10 mg nightly.    Hemifacial spasm  Recurrent hemifacial spasm, previously managed with surgery. Symptoms have recurred, potentially exacerbating headaches. No prior Botox treatment.  - Referred to movement disorders clinic for evaluation and potential Botox treatment.    Peripheral neuropathy  Chronic peripheral neuropathy with worsening symptoms over years, including numbness and needle-like sensations in feet. Normal nerve conduction study in July suggests small fiber neuropathy. Differential includes diabetes, lupus, vitamin deficiencies, and monoclonal gammopathy. Current treatment with pregabalin  (Lyrica ) per pain management.  - Ordered blood tests for vitamin deficiencies, thyroid function, and monoclonal gammopathy.  - Scheduled skin biopsy for small fiber neuropathy confirmation.  - Referred to pain management for follow-up on pregabalin  prescription.    Follow Up:  6 months    Thank you very much for this consultation.  Sincerely,  Lauraine Kitty NP    Note forwarded to Dr.  Almodovar for review.     CC: Pcp, None Per Patient         I personally spent 43 minutes face-to-face and non-face-to-face in the care of this patient, which includes all pre, intra, and post visit time on the date of service.  All documented time was specific to the E/M visit and does not include any procedures that may have been performed.     Portions of this record have been created using [employer], as well as AI [employer] Manager. Errors have been sought, but may not have been identified and corrected.      Subjective:   Subjective        Obtained history from the patient.       HPI: Patient is a 69 y.o. female seen at the request of Pcp, None Per Patient. Relevant PMH of HTN, DM, asthma, pulmonary fibrosis, pulmonary nodules, constipation, GERD, neuropathy, CKD I, SLE, SOB, HLD, fibromyalgia, insomnia, nausea, vertigo, imbalance, anxiety, microvascular decompression for hemifacial spasm, R, 2000, here today for evaluation of headache.    History of Present Illness  Caitlin Smith is a 69 year old female with headaches and neuropathy who presents for a neurology consultation. She was referred by Dr. Lillian for evaluation of headaches and neuropathy.    She experiences headaches that began after surgery for hemifacial muscle spasms. The headaches are located on the sides and forehead, lasting several hours and recurring over a couple of days. Pain is rated as a 7 out of 10, described as a squeezing and pulsating sensation. No warning signs precede the headaches. During the headaches, she sometimes feels nauseous and experiences blurry vision, but lights, sounds, and smells do not bother her. The headaches are debilitating and seem to exacerbate her facial spasms. She is currently taking cyclobenzaprine  for her headaches PRN, which she feels works better when taken every night.    Regarding her neuropathy, she has a long-standing history of symptoms that have worsened over the years. She describes a loss of sensation under her feet, which feels like 'little needles.' She has a history of lupus and diabetes, which could contribute to her neuropathy. A nerve conduction study in July was normal. She is currently taking pregabalin  for neuropathy pain, but she is unsure about the status of her prescription refills.    She also mentions a history of hemifacial muscle spasms, which initially improved after surgery but have recently recurred. She has not tried Botox injections for the spasms.    Started about a year after hemifacial spasm surgery in 2000. Hemifacial spasm is back.     Having neuropathy. Started years ago, feels this is worse. Reports recent visit with PCP and could not feel pinprick. Feeling pins and needles in feet.     HA 1 - located temples and forehead, lasting hours  Pain 7/10, described as squeezing, pulsation  Aura: No  Photophobia: No  Phonophobia: No  Osmophobia: No  Nausea/Vomiting: Yes, describe: nausea only  Vision changes: Yes, describe: blurred and double vision  Unilateral tearing, facial flushing, ptosis, weakness, paresthesia: No  Tinnitus: No  Relieving factors No  Aggravating factors: No    Current HA 1 days/month: 10  HA 1 days that are debilitating: 10    Current Medications:   Wellbutrin   Lyrica   Lamotrigine   Trazodone   Cyclobenzaprine     Failed Medications:  None        Past Medical Hx, Family Hx, Social Hx, and Problem List has  been reviewed in the Encounter Interface.        REVIEW OF SYSTEMS: pertinent items noted in HPI          Objective:       Physical Exam:  Blood pressure 135/84, pulse 64, temperature 36.4 ??C (97.5 ??F), temperature source Temporal, height 170.2 cm (5' 7), weight 79.8 kg (176 lb), SpO2 100%, not currently breastfeeding.       Constitutional:       Appearance: Normal appearance. Well-developed and well-groomed.   Neurological:      Mental Status: Oriented to person, place, and time.   Psychiatric:         Attention and Perception: Attention normal.         Mood and Affect: Mood and affect normal.         Behavior: Behavior is cooperative.         Thought Content: Thought content normal.       Neurological Examination:  Patient is awake, alert, and appropriately oriented. Attentive to examiner, cooperative with exam. Language is fluent with normal comprehension and repetition. Fund of knowledge is normal. Memory is intact. Patient is able to give details of her own history.  Speech is normal without any evidence of dysarthria.     Cranial nerves:    II: fundoscopic exam is unremarkable. Visual fields are full. Pupils are equal, round, and reactive to light.  III, IV and VI: extraocular movements are full. There is no nystagmus.  V: Facial sensation is intact and symmetric.  VII: The face is strong and symmetric.  VIII: Hearing is intact to finger rub bilaterally.  IX and X: Soft palate elevates symmetrically in the midline.  XI: Shoulder shrug and sternocleidomastoids are strong.  XII: Tongue is midline, normal movement, no atrophy or fasciculations.    Motor Examination:   Muscle tone is normal.  There is no evidence of atrophy or fasciculations.    Strength evaluation:      MUSCLE Right (MRC Grade) Left (MRC Grade)   Neck Flexor 5    Neck Extensor 5    Arm abductor 5 5   Elbow extensor 5 5   Elbow flexor  5 5   Wrist extensor 5 5   Wrist flexor 5 5   Finger flexor  5 5   Finger extensor 5 5   Hip flexor  5 5   Knee extensor  5 5   Knee flexor  5 5   Ank Dorsiflexor  5 5   Ank Plantarflexor  5 5     Reflexes:    Reflexes Right Left   Biceps 1+ 1+   Triceps 2+ 2+   Brachioradialis 1+ 1+   Patella 1+ 1+   Achilles 0 0+   Babinski Flexor Flexor     Sensory:  Light touch sensation is normal in the upper and lower extremities.  Pinprick is diminished and near absent in the feet to calves, felt slightly up to the knee, and diminished in the fingers and palms.  Vibratory sense is diminished to the ankles and intact in the fingers.     Coordination and Gait:  There is evidence of tremor, postural and action, low amplitude high frequency in bilateral hands.   Fine motor control is normal.   Rapid alternating movements are intact without dysmetria or bradykinesia  Gait is narrow-based and steady, with normal arm swing.          Diagnostic Studies and Review of Records:  EMG 03/2024:  Conclusions:  Normal study. There is no electrodiagnostic evidence of large fiber polyneuropathy, right leg compression neuropathy, or right lumbosacral radiculopathy.     MRI Brain with and without Contrast (05/19/1999):  Normal contrast-enhanced MRI examination of the brain     MRI Brain with and without Contrast (07/07/2010):  Small area of relative hypoenhancement seen of the right anterior pituitary gland consistent with an adenoma.  Cranial nerves 7 and is normal in appearance.     MRI Brain with and without Contrast (12/29/2010):  Persistent small area of hypoenhancement in the right pituitary gland.  Small focus of avid enhancement near the porous acusticus, unchanged. This does not appear to be associated with vascular structures based on MRA images, therefore a schwannoma or meningioma may be more likely.     MRI Brain with and without Contrast (06/24/2011):  Unchanged focal enhancement around the right seventh and eighth cranial nerves.    Small hypoenhancing lesion in the right pituitary is not well seen on today's study.     MRI Brain with and without Contrast (12/26/2011):  Unchanged focal enhancement in the region of the right porus acousticus.  Grossly unchanged appearance of indistinct hypoenhancement involving the right pituitary gland.     MRI Brain with and without Contrast (09/21/2012):  Stable appearance of inhomogeneous enhancement within the pituitary gland, likely due to susceptibility artifact. No definite residual tumor identified. No further followup is recommended.  Similar enhancement within the right porus acousticus.     MRI Brain with and without Contrast (10/18/2013):  No definite residual tumor identified within the pituitary.  Unchanged indeterminate region of enhancement within the right porous acusticus.     MRI Brain with and without Contrast (12/25/2014):  MRI of the pituitary gland is within normal limits. No evidence for tumor.  Stable focus of enhancement in the region of the right porus acousticus.  Mild white matter signal abnormalities in the periventricular and subcortical white matter, which are nonspecific.     MRI Brain with and without Contrast (05/27/2016):  No evidence of pituitary mass, partial empty sella appearance.  Stable enhancing focus in the right porus acusticus back to 2011 comparison, likely postsurgical.  Scattered foci of T2/FLAIR signal abnormalities in the white matter are again visualized and nonspecific in nature.     MRI Brain Cranial Nerve 8 Protocol with and without Contrast (12/28/2016):  Stable postsurgical changes in the right porus acusticus.  Partial empty sella, unchanged.     MRI Brain with and without Contrast (11/28/2019):  Redemonstrated empty sella turcica without evidence for pituitary gland neoplasm.  Stable postsurgical changes at the right cerebellopontine angle.     MRI Brain with and without Contrast (08/01/2022): (per outside read):  Status post right retrosigmoid craniotomy presumably for surgery related to the patient's reported right facial spasm. There is a vascular structure which abuts the lateral margin of the root entry zone of the right trigeminal nerve without frank mass effect or displacement.  Partially empty sella with no evidence of pituitary mass lesion.  No acute finding.     MRI Brain with and without Contrast (03/15/2023):  Postsurgical changes of right retrosigmoid craniotomy. Small area of enhancement at the right porus acusticus appears unchanged compared to multiple priors. Otherwise, normal configuration of the internal auditory canals and inner ear structures.  No evidence of infarct or new or enlarging mass.

## 2024-09-16 LAB — MONOCLONAL GAMMOPATHY WORKUP, SERUM
ALBUMIN (SPE): 3.9 g/dL (ref 3.5–5.0)
ALPHA-1 GLOBULIN: 0.3 g/dL (ref 0.2–0.5)
ALPHA-2 GLOBULIN: 0.6 g/dL (ref 0.5–1.1)
BETA-1 GLOBULIN: 0.4 g/dL (ref 0.3–0.6)
BETA-2 GLOBULIN: 0.5 g/dL (ref 0.2–0.6)
GAMMAGLOBULIN: 1.7 g/dL — ABNORMAL HIGH (ref 0.5–1.5)
PROTEIN TOTAL (SPECIAL CHEM): 7.5 g/dL

## 2024-09-16 LAB — SERUM FREE LIGHT CHAINS
K/L FLC RATIO: 1.4 (ref 0.26–1.65)
KAPPA FREE,SERUM: 3.06 mg/dL — ABNORMAL HIGH (ref 0.33–1.94)
LAMBDA FREE, SER: 2.18 mg/dL (ref 0.57–2.63)

## 2024-09-18 LAB — ZINC: ZINC: 61 ug/dL

## 2024-09-18 LAB — COPPER, SERUM: COPPER: 130 ug/dL

## 2024-09-19 ENCOUNTER — Encounter
Admit: 2024-09-19 | Discharge: 2024-09-19 | Payer: Medicare (Managed Care) | Attending: Student in an Organized Health Care Education/Training Program | Primary: Student in an Organized Health Care Education/Training Program

## 2024-09-19 ENCOUNTER — Inpatient Hospital Stay: Admit: 2024-09-19 | Discharge: 2024-09-19 | Payer: Medicaid (Managed Care)

## 2024-09-19 LAB — VITAMIN B2: VITAMIN B2: 6 ug/L

## 2024-09-19 LAB — VITAMIN B6: VITAMIN B6: 5 ug/L

## 2024-09-19 MED ADMIN — lidocaine (PF) (XYLOCAINE-MPF) 20 mg/mL (2 %) injection: INTRAVENOUS | @ 14:00:00 | Stop: 2024-09-19

## 2024-09-19 MED ADMIN — Propofol (DIPRIVAN) injection: INTRAVENOUS | @ 14:00:00 | Stop: 2024-09-19

## 2024-09-19 MED ADMIN — Propofol (DIPRIVAN) injection: INTRAVENOUS | @ 15:00:00 | Stop: 2024-09-19

## 2024-09-19 MED ADMIN — sodium chloride (NS) 0.9 % infusion: 10 mL/h | INTRAVENOUS | @ 14:00:00 | Stop: 2024-09-19

## 2024-09-20 ENCOUNTER — Ambulatory Visit: Admit: 2024-09-20 | Discharge: 2024-09-21 | Payer: Medicare (Managed Care)

## 2024-09-20 ENCOUNTER — Inpatient Hospital Stay: Admit: 2024-09-20 | Discharge: 2024-09-20 | Payer: Medicare (Managed Care)

## 2024-09-20 DIAGNOSIS — Z1231 Encounter for screening mammogram for malignant neoplasm of breast: Principal | ICD-10-CM

## 2024-09-20 DIAGNOSIS — Z803 Family history of malignant neoplasm of breast: Principal | ICD-10-CM

## 2024-09-20 LAB — VITAMIN B1, WHOLE BLOOD: VITAMIN B1: 93 nmol/L
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
## 2024-09-21 LAB — ENCEPHALOPATHY, AUTOIMMUNE/PARANEOPLASTIC EVAL, SERUM
AGNA-1, SERUM: NEGATIVE
AMPA RECEPTOR ANTIBODY, SERUM: NEGATIVE
AMPHIPHYSIN ANTIBODY, SERUM: NEGATIVE
ANNA TYPE 3, SERUM: NEGATIVE
ANTINEURONAL NUCLEAR ANTIBODY-TYPE 1: NEGATIVE
ANTINEURONAL NUCLEAR ANTIBODY-TYPE 2: NEGATIVE
CASPR2-IGG CBA, SERUM: NEGATIVE
CRMP-5 IGG: NEGATIVE
DPPX AB CBA: NEGATIVE
GABA-B-R AB, SERUM: NEGATIVE
GAD65 AB, SERUM: 0 nmol/L
GFAP IFA: NEGATIVE
IGLON5 CBA: NEGATIVE
LGI1-IGG CBA, SERUM: NEGATIVE
MGLUR1 AB IFA: NEGATIVE
NIF IFA: NEGATIVE
NMDA RECEPTOR ANTIBODY, SERUM: NEGATIVE
PCA TYPE 1, SERUM: NEGATIVE
PCA TYPE 2, SERUM: NEGATIVE
PCA TYPE TR SERUM: NEGATIVE
PDE10A AB IFA, S: NEGATIVE
SEPTIN-7 IFA, S: NEGATIVE
TRIM46 AB IFA, S: NEGATIVE

## 2024-09-24 LAB — VITAMIN A: VITAMIN A RESULT: 45.2 ug/dL

## 2024-09-24 LAB — VITAMIN E: VITAMIN E LEVEL: 10 mg/L

## 2024-09-25 ENCOUNTER — Encounter: Payer: Self-pay | Admitting: Family Medicine

## 2024-09-25 DIAGNOSIS — F411 Generalized anxiety disorder: Principal | ICD-10-CM

## 2024-09-25 MED ORDER — HYDROXYZINE HCL 10 MG TABLET
ORAL_TABLET | Freq: Two times a day (BID) | ORAL | 0 refills | 30.00000 days | PRN
Start: 2024-09-25 — End: ?

## 2024-09-25 NOTE — Telephone Encounter (Signed)
 Fax recieved from [facility] pharmacy requesting 90 days supply.  hydrOXYzine  (ATARAX ) 10 MG tablet

## 2024-09-26 ENCOUNTER — Other Ambulatory Visit: Payer: Self-pay | Admitting: Family Medicine

## 2024-09-26 ENCOUNTER — Ambulatory Visit: Payer: Self-pay

## 2024-09-26 MED ORDER — HYDROXYZINE HCL 10 MG TABLET
ORAL_TABLET | Freq: Two times a day (BID) | ORAL | 0 refills | 30.00000 days | Status: CP | PRN
Start: 2024-09-26 — End: ?

## 2024-09-26 NOTE — Telephone Encounter (Signed)
 FYI Only or Action Required?: FYI only for provider: appointment scheduled on 09/27/24.  Patient was last seen in primary care on 09/02/2024 by Glenard Mire, MD.  Called Nurse Triage reporting Weight Loss and Nausea.  Symptoms began several months ago.  Interventions attempted: Prescription medications: promethazine.  Symptoms are: unchanged.  Triage Disposition: See PCP When Office is Open (Within 3 Days)  Patient/caregiver understands and will follow disposition?: Yes                           Message from Victoria B sent at 09/26/2024  9:23 AM EST  Reason for Triage: patient has been loosin weight, can't eat and feels nauseous   Reason for Disposition  Nausea lasts > 1 week  Answer Assessment - Initial Assessment Questions Patient also complains of fatigue (able to stand and walk), abdominal burning feels like a fever in my stomach constant warm burning sensation. No vomiting, difficulty breathing, fever   1. NAUSEA SEVERITY: How bad is the nausea? (e.g., mild, moderate, severe; dehydration, weight loss)     Weight loss (about 10 pound weight loss over the past 3 months). Able to drink fluids and is urinating.  2. ONSET: When did the nausea begin?     2-3 months ago.  3. VOMITING: Any vomiting? If Yes, ask: How many times today?     No.  4. RECURRENT SYMPTOM: Have you had nausea before? If Yes, ask: When was the last time? What happened that time?     Yes, has been ongoing since October.  5. CAUSE: What do you think is causing the nausea?     Unsure. EGD last Thursday, they said they didn't find a reason for symptoms  Protocols used: Nausea-A-AH

## 2024-09-27 ENCOUNTER — Encounter: Payer: Self-pay | Admitting: Family Medicine

## 2024-09-27 ENCOUNTER — Ambulatory Visit: Admitting: Family Medicine

## 2024-09-27 VITALS — BP 128/80 | HR 73 | Resp 16 | Ht 67.0 in | Wt 177.2 lb

## 2024-09-27 DIAGNOSIS — R053 Chronic cough: Secondary | ICD-10-CM

## 2024-09-27 DIAGNOSIS — K2950 Unspecified chronic gastritis without bleeding: Secondary | ICD-10-CM

## 2024-09-27 DIAGNOSIS — E1159 Type 2 diabetes mellitus with other circulatory complications: Secondary | ICD-10-CM | POA: Diagnosis not present

## 2024-09-27 DIAGNOSIS — J4550 Severe persistent asthma, uncomplicated: Secondary | ICD-10-CM

## 2024-09-27 DIAGNOSIS — M3213 Lung involvement in systemic lupus erythematosus: Secondary | ICD-10-CM | POA: Diagnosis not present

## 2024-09-27 DIAGNOSIS — J3489 Other specified disorders of nose and nasal sinuses: Secondary | ICD-10-CM

## 2024-09-27 DIAGNOSIS — J8410 Pulmonary fibrosis, unspecified: Secondary | ICD-10-CM

## 2024-09-27 DIAGNOSIS — I152 Hypertension secondary to endocrine disorders: Secondary | ICD-10-CM

## 2024-09-27 MED ORDER — ALBUTEROL SULFATE HFA 108 (90 BASE) MCG/ACT IN AERS: 2.0000 | INHALATION_SPRAY | RESPIRATORY_TRACT | 0 refills | Status: AC | PRN

## 2024-09-27 MED ORDER — BENZONATATE 100 MG PO CAPS: 100.0000 mg | ORAL_CAPSULE | Freq: Three times a day (TID) | ORAL | 0 refills | Status: AC | PRN

## 2024-09-27 MED ORDER — MONTELUKAST SODIUM 10 MG PO TABS: 10.0000 mg | ORAL_TABLET | Freq: Every day | ORAL | 0 refills | Status: AC

## 2024-09-27 MED ORDER — LEVOCETIRIZINE DIHYDROCHLORIDE 5 MG PO TABS: 5.0000 mg | ORAL_TABLET | Freq: Every evening | ORAL | 0 refills | Status: AC

## 2024-09-27 MED ORDER — AMLODIPINE BESYLATE 5 MG PO TABS: 5.0000 mg | ORAL_TABLET | Freq: Every day | ORAL | 1 refills | Status: AC

## 2024-09-27 NOTE — Progress Notes (Signed)
 Name: Nicole Ballard   MRN: [id number]    DOB: [birth year]   Date:09/27/2024       Progress Note  Subjective  Chief Complaint  Chief Complaint  Patient presents with   Nausea    2-3 months ago   Abdominal Pain    abdominal burning feels like a fever in my stomach constant warm burning sensation.    Discussed the use of AI scribe software for clinical note transcription with the patient, who gave verbal consent to proceed.  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  She has been experiencing a persistent cough for approximately three weeks, which initially was mild but progressed to include mucus production, hoarseness, and rhinorrhea. She has been using Flonase  without relief and has not sought emergency care for these symptoms. Despite taking over-the-counter decongestants and Coricidin HBP for hypertension, she continues to expectorate green mucus. She uses a Qvar inhaler, two puffs twice daily, but has not refilled her Albuterol  prescription. She has an upcoming appointment with her pulmonologist on February 2nd.  She has a history of post-inflammatory pulmonary fibrosis secondary to lupus, managed with Plaquenil and mycophenolate .  She also experiences chronic nausea and abdominal pain, leading to frequent emergency room visits. An endoscopy on January 15th revealed mild chronic gastritis without H. pylori infection. She takes metoclopramide , omeprazole , and promethazine for nausea, with promethazine providing some relief. Her nausea began around October, and she is uncertain if it correlates with any new medications.  Her current medications include hydralazine , recently adjusted from 25 mg to 10 mg twice daily, Linzess  145 mcg, and hydroxyzine  for anxiety.    Patient Active Problem List   Diagnosis Date Noted   Chronic pain of both shoulders  02/15/2024   Balance problem 02/01/2024   Diabetic polyneuropathy associated with type 2 diabetes mellitus (HCC) 02/01/2024   Lumbar facet arthropathy 10/02/2023   Genetic testing 03/14/2023   Abnormal SPEP 01/13/2023   Weight loss 01/13/2023   Family history of breast cancer 01/13/2023   Asthma, chronic, severe persistent, uncomplicated (HCC) 10/12/2022   Interstitial lung disease (HCC) 07/12/2022   Empty sella 07/12/2022   History of craniotomy 07/12/2022   Immunocompromised 04/29/2021   Hyperglycemia 10/23/2017   Vertigo 09/29/2016   Bronchiolitis obliterans organizing pneumonia (HCC) 09/29/2016   Neck pain, musculoskeletal 12/15/2015   Cough in adult 09/11/2015   Abnormal CAT scan 04/11/2015   Auditory impairment 04/11/2015   Insomnia, persistent 04/11/2015   Chronic kidney disease (CKD), stage I 04/11/2015   Chronic nonmalignant pain 04/11/2015   Chronic constipation 04/11/2015   Dyslipidemia 04/11/2015   Fibromyalgia 04/11/2015   Gastro-esophageal reflux disease without esophagitis 04/11/2015   Dysphonia 04/11/2015   Low back pain 04/11/2015   Eczema intertrigo 04/11/2015   Keratosis pilaris 04/11/2015   Chronic recurrent major depressive disorder 04/11/2015   Dysmetabolic syndrome 04/11/2015   Neurogenic claudication 04/11/2015   Perennial allergic rhinitis with seasonal variation 04/11/2015   Abnormal presence of protein in urine 04/11/2015   Seborrhea capitis 04/11/2015   Moderate dysplasia of vulva 04/11/2015   Vitamin D  deficiency 04/11/2015   Spinal stenosis of lumbar region 04/11/2015   Treadmill stress test negative for angina pectoris 03/05/2015   Shortness of breath 05/20/2014   H/O total knee replacement 12/31/2013   Episcleritis 09/26/2013   Vocal cord dysfunction 05/28/2013   Anxiety, generalized 03/19/2013   Back pain 12/18/2012  Pulmonary nodules 11/26/2012   Lung nodule, multiple 11/26/2012   Arthritis, degenerative 11/01/2012   H/O aspiration  pneumonitis 10/22/2012   Postinflammatory pulmonary fibrosis (HCC) 10/22/2012   Mixed incontinence 05/04/2012   Lung involvement in systemic lupus erythematosus (HCC) 11/05/2010   Chronic nausea 07/01/2008   Benign hypertension 01/25/2005    Past Surgical History:  Procedure Laterality Date   BRAIN SURGERY     CHOLECYSTECTOMY     VAGINAL HYSTERECTOMY     vulvar surgery      Family History  Problem Relation Age of Onset   Diabetes Mother    Hypertension Mother    Breast cancer Mother    Stomach cancer Father        d.>50   Breast cancer Maternal Aunt        dx >50   Breast cancer Maternal Aunt        dx 18s   Breast cancer Maternal Aunt    Cancer Maternal Uncle        unk type   Cancer Maternal Uncle        unk type   Breast cancer Cousin        dx 15s   Breast cancer Cousin        dx 30s, does not want genetic testing    Social History   Tobacco Use   Smoking status: Never   Smokeless tobacco: Never  Substance Use Topics   Alcohol use: No    Alcohol/week: 0.0 standard drinks of alcohol    Current Medications[1]  Allergies[2]  I personally reviewed active problem list, medication list, allergies, family history with the patient/caregiver today.   ROS  Ten systems reviewed and is negative except as mentioned in HPI    Objective Physical Exam VITALS: BP- 128/80 CONSTITUTIONAL: Patient appears well-developed and well-nourished. No distress. HEENT: Head atraumatic, normocephalic, neck supple. CARDIOVASCULAR: Normal rate, regular rhythm and normal heart sounds. No murmur heard. No BLE edema. PULMONARY: Effort normal. Breath sounds dry. No respiratory distress. ABDOMINAL: There is no tenderness or distention. MUSCULOSKELETAL: Normal gait. Without gross motor or sensory deficit. PSYCHIATRIC: Patient has a normal mood and affect. Behavior is normal. Judgment and thought content normal.  Vitals:   09/27/24 0859  BP: 128/80  Pulse: 73  Resp: 16  SpO2:  97%  Weight: 177 lb 3.2 oz (80.4 kg)  Height: 5' 7 (1.702 m)    Body mass index is 27.75 kg/m.  Recent Results (from the past 2160 hours)  Comprehensive metabolic panel     Status: Abnormal   Collection Time: 08/07/24  7:36 AM  Result Value Ref Range   Sodium 141 135 - 145 mmol/L   Potassium 4.0 3.5 - 5.1 mmol/L   Chloride 105 98 - 111 mmol/L   CO2 26 22 - 32 mmol/L   Glucose, Bld 119 (H) 70 - 99 mg/dL    Comment: Glucose reference range applies only to samples taken after fasting for at least 8 hours.   BUN 7 (L) 8 - 23 mg/dL   Creatinine, Ser 9.15 0.44 - 1.00 mg/dL   Calcium  9.8 8.9 - 10.3 mg/dL   Total Protein 7.8 6.5 - 8.1 g/dL   Albumin 4.2 3.5 - 5.0 g/dL   AST 22 15 - 41 U/L   ALT 7 0 - 44 U/L   Alkaline Phosphatase 103 38 - 126 U/L   Total Bilirubin 0.5 0.0 - 1.2 mg/dL   GFR, Estimated >39 >39 mL/min    Comment: (  NOTE) Calculated using the CKD-EPI Creatinine Equation (2021)    Anion gap 10 5 - 15    Comment: Performed at [facility], [address] [place], [place] [address]  Lipase, blood     Status: None   Collection Time: 08/07/24  7:36 AM  Result Value Ref Range   Lipase 17 11 - 51 U/L    Comment: Performed at [facility], [address] [place], [place] [address]  CBC with Differential     Status: None   Collection Time: 08/07/24  7:36 AM  Result Value Ref Range   WBC 4.2 4.0 - 10.5 K/uL   RBC 4.96 3.87 - 5.11 MIL/uL   Hemoglobin 13.5 12.0 - 15.0 g/dL   HCT 57.1 63.9 - 53.9 %   MCV 86.3 80.0 - 100.0 fL   MCH 27.2 26.0 - 34.0 pg   MCHC 31.5 30.0 - 36.0 g/dL   RDW 86.4 88.4 - 84.4 %   Platelets 306 150 - 400 K/uL   nRBC 0.0 0.0 - 0.2 %   Neutrophils Relative % 44 %   Neutro Abs 1.9 1.7 - 7.7 K/uL   Lymphocytes Relative 37 %   Lymphs Abs 1.6 0.7 - 4.0 K/uL   Monocytes Relative 14 %   Monocytes Absolute 0.6 0.1 - 1.0 K/uL   Eosinophils Relative 2 %   Eosinophils Absolute 0.1 0.0 - 0.5 K/uL   Basophils Relative 2 %    Basophils Absolute 0.1 0.0 - 0.1 K/uL   Immature Granulocytes 1 %   Abs Immature Granulocytes 0.02 0.00 - 0.07 K/uL    Comment: Performed at [facility], [address] [place], [place] [address]  Urinalysis, Routine w reflex microscopic -Urine, Clean Catch     Status: Abnormal   Collection Time: 08/07/24  7:36 AM  Result Value Ref Range   Color, Urine AMBER (A) YELLOW    Comment: BIOCHEMICALS MAY BE AFFECTED BY COLOR   APPearance HAZY (A) CLEAR   Specific Gravity, Urine 1.023 1.005 - 1.030   pH 5.0 5.0 - 8.0   Glucose, UA NEGATIVE NEGATIVE mg/dL   Hgb urine dipstick NEGATIVE NEGATIVE   Bilirubin Urine NEGATIVE NEGATIVE   Ketones, ur NEGATIVE NEGATIVE mg/dL   Protein, ur 899 (A) NEGATIVE mg/dL   Nitrite NEGATIVE NEGATIVE   Leukocytes,Ua NEGATIVE NEGATIVE   RBC / HPF 0-5 0 - 5 RBC/hpf   WBC, UA 0-5 0 - 5 WBC/hpf   Bacteria, UA RARE (A) NONE SEEN   Squamous Epithelial / HPF 0-5 0 - 5 /HPF   Mucus PRESENT    Hyaline Casts, UA PRESENT     Comment: Performed at [facility], [address] [place], [place] [address]  Lipase, blood     Status: None   Collection Time: 08/15/24  3:46 PM  Result Value Ref Range   Lipase 16 11 - 51 U/L    Comment: Performed at [facility], [address] [place], [place] [address]  Comprehensive metabolic panel     Status: Abnormal   Collection Time: 08/15/24  3:46 PM  Result Value Ref Range   Sodium 138 135 - 145 mmol/L   Potassium 4.6 3.5 - 5.1 mmol/L   Chloride 101 98 - 111 mmol/L   CO2 27 22 - 32 mmol/L   Glucose, Bld 113 (H) 70 - 99 mg/dL    Comment: Glucose reference range applies only to samples taken after fasting for at least 8 hours.  BUN 8 8 - 23 mg/dL   Creatinine, Ser 9.18 0.44 - 1.00 mg/dL   Calcium  9.1 8.9 - 10.3 mg/dL   Total Protein 7.9 6.5 - 8.1 g/dL   Albumin 4.2 3.5 - 5.0 g/dL   AST 22 15 - 41 U/L   ALT 13 0 - 44 U/L   Alkaline Phosphatase 99 38 - 126 U/L   Total Bilirubin 0.5 0.0 - 1.2  mg/dL   GFR, Estimated >39 >39 mL/min    Comment: (NOTE) Calculated using the CKD-EPI Creatinine Equation (2021)    Anion gap 10 5 - 15    Comment: Performed at [facility], [address] [place], [place] [address]  CBC     Status: Abnormal   Collection Time: 08/15/24  3:46 PM  Result Value Ref Range   WBC 3.8 (L) 4.0 - 10.5 K/uL   RBC 5.18 (H) 3.87 - 5.11 MIL/uL   Hemoglobin 13.9 12.0 - 15.0 g/dL   HCT 55.8 63.9 - 53.9 %   MCV 85.1 80.0 - 100.0 fL   MCH 26.8 26.0 - 34.0 pg   MCHC 31.5 30.0 - 36.0 g/dL   RDW 86.7 88.4 - 84.4 %   Platelets 344 150 - 400 K/uL   nRBC 0.0 0.0 - 0.2 %    Comment: Performed at [facility], [address] [place], [place] [address]  Urinalysis, Routine w reflex microscopic -Urine, Clean Catch     Status: Abnormal   Collection Time: 08/15/24  3:46 PM  Result Value Ref Range   Color, Urine YELLOW (A) YELLOW   APPearance CLEAR (A) CLEAR   Specific Gravity, Urine 1.024 1.005 - 1.030   pH 6.0 5.0 - 8.0   Glucose, UA NEGATIVE NEGATIVE mg/dL   Hgb urine dipstick NEGATIVE NEGATIVE   Bilirubin Urine NEGATIVE NEGATIVE   Ketones, ur 20 (A) NEGATIVE mg/dL   Protein, ur 30 (A) NEGATIVE mg/dL   Nitrite NEGATIVE NEGATIVE   Leukocytes,Ua TRACE (A) NEGATIVE   RBC / HPF 0-5 0 - 5 RBC/hpf   WBC, UA 0-5 0 - 5 WBC/hpf   Bacteria, UA NONE SEEN NONE SEEN   Squamous Epithelial / HPF 0-5 0 - 5 /HPF   Mucus PRESENT     Comment: Performed at [facility], [address] [place], [place] [address]  POCT glycosylated hemoglobin (Hb A1C)     Status: None   Collection Time: 09/02/24  9:12 AM  Result Value Ref Range   Hemoglobin A1C 5.4 4.0 - 5.6 %   HbA1c POC (<> result, manual entry)     HbA1c, POC (prediabetic range)     HbA1c, POC (controlled diabetic range)    Urine Microalbumin w/creat. ratio     Status: None   Collection Time: 09/02/24 10:03 AM  Result Value Ref Range   Creatinine, Urine 112 20 - 275 mg/dL   Microalb, Ur 1.5  mg/dL    Comment: Reference Range Not established    Microalb Creat Ratio 13 <30 mg/g creat    Comment: . The ADA defines abnormalities in albumin excretion as follows: SABRA Albuminuria Category        Result (mg/g creatinine) . Normal to Mildly increased   <30 Moderately increased         30-299  Severely increased           > OR = 300 . The ADA recommends that at least two of three specimens collected within a 3-6 month period be  abnormal before considering a patient to be within a diagnostic category.   Lipid panel     Status: None   Collection Time: 09/02/24 10:03 AM  Result Value Ref Range   Cholesterol 122 <200 mg/dL   HDL 55 > OR = 50 mg/dL   Triglycerides 67 <849 mg/dL   LDL Cholesterol (Calc) 53 mg/dL (calc)    Comment: Reference range: <100 . Desirable range <100 mg/dL for primary prevention;   <70 mg/dL for patients with CHD or diabetic patients  with > or = 2 CHD risk factors. SABRA LDL-C is now calculated using the Martin-Hopkins  calculation, which is a validated novel method providing  better accuracy than the Friedewald equation in the  estimation of LDL-C.  Gladis APPLETHWAITE et al. SANDREA. 7986;689(80): 2061-2068  (http://education.[facility].com/faq/FAQ164)    Total CHOL/HDL Ratio 2.2 <5.0 (calc)   Non-HDL Cholesterol (Calc) 67 <869 mg/dL (calc)    Comment: For patients with diabetes plus 1 major ASCVD risk  factor, treating to a non-HDL-C goal of <100 mg/dL  (LDL-C of <29 mg/dL) is considered a therapeutic  option.     Diabetic Foot Exam:     PHQ2/9:    09/27/2024    8:58 AM 09/02/2024    8:48 AM 08/06/2024    3:13 PM 07/24/2024    1:22 PM 06/25/2024    2:14 PM  Depression screen PHQ 2/9  Decreased Interest 2 2 2 2 2   Down, Depressed, Hopeless 1 1 1 1 2   PHQ - 2 Score 3 3 3 3 4   Altered sleeping 3 3 3 3 2   Tired, decreased energy 2 2 2 2 2   Change in appetite 2 2 2 2 1   Feeling bad or failure about yourself  1 1 1 1 1   Trouble concentrating 1  1 1 1 2   Moving slowly or fidgety/restless 1 1 1 1    Suicidal thoughts 0 0 0 0 0  PHQ-9 Score 13 13 13 13 12    Difficult doing work/chores Very difficult Very difficult Very difficult  Somewhat difficult     Data saved with a previous flowsheet row definition    phq 9 is positive  Fall Risk:    09/27/2024    8:57 AM 09/02/2024    8:43 AM 08/06/2024    3:13 PM 06/25/2024    2:14 PM 05/03/2024   11:10 AM  Fall Risk   Falls in the past year? 0 0 0 0 0  Number falls in past yr: 0 0 0 0 0  Injury with Fall? 0 0 0 0  0   Risk for fall due to : No Fall Risks No Fall Risks No Fall Risks No Fall Risks No Fall Risks  Follow up Falls evaluation completed Falls evaluation completed Falls evaluation completed  Falls evaluation completed     Data saved with a previous flowsheet row definition     Assessment & Plan Chronic gastritis with persistent nausea and abdominal pain Chronic gastritis confirmed by endoscopy. Symptoms likely due to inflammation and medication use. No H. pylori infection, ulceration, or infection. Anxiety and medication intake on an empty stomach may exacerbate symptoms. - Continue promethazine for nausea. - Consult GI specialist for further management. - Advised small, frequent meals and avoid medications on an empty stomach. - Encouraged taking medications with food.  Postinflammatory pulmonary fibrosis due to systemic lupus erythematosus Postinflammatory pulmonary fibrosis secondary to systemic lupus erythematosus. Chronic cough and mucus production likely due to inflammation. High risk for  complications due to lupus and fibrosis. - Follow up with pulmonologist on February 2nd. - Avoid unnecessary antibiotics unless indicated. - Consider chest x-ray with pulmonologist.  Severe persistent asthma Managed with Qvar inhaler. Chronic cough and mucus production possibly related to asthma exacerbation or inflammation. - Continue Qvar inhaler as prescribed. - Prescribed  albuterol  inhaler for acute symptoms. - Follow up with pulmonologist for asthma management.  Chronic cough and rhinorrhea Likely due to allergies. Symptoms include sniffles and mucus production, possibly contributing to cough. - Prescribed levocetirizine for allergy management. - Prescribed montelukast for asthma and allergy management. - Continue Flonase  nasal spray.  Benign hypertension with type 2 diabetes Blood pressure 128/80 mmHg. - Adjusted hydralazine  to 5 mg once daily.        [1]  Current Outpatient Medications:    albuterol  (VENTOLIN  HFA) 108 (90 Base) MCG/ACT inhaler, SMARTSIG:2 Puff(s) By Mouth Every 6 Hours PRN, Disp: , Rfl:    amLODipine  (NORVASC ) 2.5 MG tablet, Take 3 tablets (7.5 mg total) by mouth daily., Disp: 270 tablet, Rfl: 0   aspirin  EC 81 MG tablet, Take 1 tablet (81 mg total) by mouth daily., Disp: 30 tablet, Rfl: 0   atenolol  (TENORMIN ) 25 MG tablet, Take 1 tablet by mouth in the morning and at bedtime., Disp: , Rfl:    atorvastatin  (LIPITOR) 40 MG tablet, TAKE 1 TABLET BY MOUTH DAILY, Disp: 100 tablet, Rfl: 2   Biotin  1 MG CAPS, Take 1 capsule by mouth daily., Disp: 30 capsule, Rfl: 0   bisacodyl (DULCOLAX) 5 MG EC tablet, Take 1 tablet (5 mg total) by mouth daily as needed for moderate constipation., Disp: 6 tablet, Rfl: 0   buPROPion  (WELLBUTRIN  XL) 300 MG 24 hr tablet, Take 300 mg by mouth in the morning., Disp: , Rfl:    Cholecalciferol (VITAMIN D ) 2000 UNITS CAPS, Take 1 capsule by mouth daily., Disp: , Rfl:    clobetasol ointment (TEMOVATE) 0.05 %, Apply 1 application topically 2 (two) times daily., Disp: , Rfl:    clonazePAM  (KLONOPIN ) 0.5 MG tablet, Take 0.25 mg by mouth at bedtime., Disp: , Rfl:    cyclobenzaprine  (FLEXERIL ) 5 MG tablet, Take by mouth., Disp: , Rfl:    diclofenac  Sodium (VOLTAREN ) 1 % GEL, Apply topically daily as needed., Disp: , Rfl:    famotidine  (PEPCID ) 20 MG tablet, Take 20 mg by mouth daily., Disp: , Rfl:    fluticasone   (FLONASE ) 50 MCG/ACT nasal spray, Place 2 sprays into both nostrils daily., Disp: 48 g, Rfl: 3   hydrALAZINE  (APRESOLINE ) 25 MG tablet, Take 1 tablet (25 mg total) by mouth 4 (four) times daily as needed (For sBP>150 and dBP>90)., Disp: 90 tablet, Rfl: 0   hydroxychloroquine (PLAQUENIL) 200 MG tablet, Take by mouth 2 (two) times daily., Disp: , Rfl:    hydrOXYzine  (ATARAX ) 10 MG tablet, TAKE 1 TABLET BY MOUTH THREE TIMES A DAY AS NEEDED, Disp: 270 tablet, Rfl: 0   lamoTRIgine  (LAMICTAL ) 200 MG tablet, Take 200 mg by mouth at bedtime., Disp: , Rfl:    lidocaine  (XYLOCAINE ) 5 % ointment, Apply 1 Application topically 4 (four) times daily as needed., Disp: , Rfl:    LINZESS  72 MCG capsule, Take 1 capsule on an empty stomach at least 30 minutes prior to a meal at the same time every day., Disp: , Rfl:    loratadine  (CLARITIN ) 10 MG tablet, Take 1 tablet (10 mg total) by mouth daily., Disp: 90 tablet, Rfl: 1   Melatonin 10 MG TABS,  Take 1 tablet by mouth at bedtime., Disp: , Rfl:    metoCLOPramide  (REGLAN ) 10 MG tablet, Take 1 tablet (10 mg total) by mouth every 8 (eight) hours as needed for refractory nausea / vomiting., Disp: 90 tablet, Rfl: 0   mycophenolate  (MYFORTIC) 360 MG TBEC EC tablet, Take 720 mg by mouth 2 (two) times daily., Disp: , Rfl:    olopatadine  (PATANOL) 0.1 % ophthalmic solution, Place 1 drop into both eyes 2 (two) times daily., Disp: 5 mL, Rfl: 1   omeprazole  (PRILOSEC) 40 MG capsule, Take 1 capsule by mouth in the morning and at bedtime. Pulmonologist, Disp: , Rfl:    ondansetron  (ZOFRAN ) 4 MG tablet, Take 1 tablet (4 mg total) by mouth every 8 (eight) hours as needed for nausea or vomiting., Disp: 20 tablet, Rfl: 0   pregabalin (LYRICA) 75 MG capsule, Take 75 mg by mouth 2 (two) times daily., Disp: , Rfl:    Prucalopride Succinate (MOTEGRITY) 2 MG TABS, Take 1 tablet by mouth daily., Disp: , Rfl:    QVAR REDIHALER 80 MCG/ACT inhaler, Inhale 2 puffs into the lungs., Disp: , Rfl:     Spacer/Aero-Holding Chambers (AEROCHAMBER MV) inhaler, 1 each by Other route., Disp: , Rfl:    telmisartan  (MICARDIS ) 80 MG tablet, TAKE 1 TABLET BY MOUTH DAILY, Disp: 100 tablet, Rfl: 2   traZODone (DESYREL) 100 MG tablet, Take 150 mg by mouth at bedtime., Disp: , Rfl:    triamcinolone  ointment (KENALOG ) 0.1 %, , Disp: , Rfl:  [2]  Allergies Allergen Reactions   Ace Inhibitors     Other reaction(s): OTHER Pt states she can not take ace inhibitors. Pt states she can not remember her reaction.    Bee Pollen    Penicillins    Pollen Extract

## 2024-09-27 NOTE — Telephone Encounter (Signed)
 Requested Prescriptions  Refused Prescriptions Disp Refills   amLODipine  (NORVASC ) 10 MG tablet [Pharmacy Med Name: amLODIPine  Besylate 10 MG Oral Tablet] 90 tablet 3    Sig: TAKE 1 TABLET BY MOUTH DAILY     Cardiovascular: Calcium  Channel Blockers 2 Passed - 09/27/2024 11:07 AM      Passed - Last BP in normal range    BP Readings from Last 1 Encounters:  09/27/24 128/80         Passed - Last Heart Rate in normal range    Pulse Readings from Last 1 Encounters:  09/27/24 73         Passed - Valid encounter within last 6 months    Recent Outpatient Visits           Today Mild chronic gastritis   [facility] Glenard Mire, MD   3 weeks ago Postinflammatory pulmonary fibrosis [facility])   [facility] Glenard Mire, MD   1 month ago Unintentional weight loss   [facility] Glenard Mire, MD   2 months ago Chronic constipation   [facility] Gareth Mliss FALCON, FNP   3 months ago Urinary symptom or sign   [facility] [place], Lauraine SAILOR, DO

## 2024-09-27 NOTE — Progress Notes (Signed)
 [facility] has been contacted in regards to their refill of mycophenolate  360 MG Tbec (MYFORTIC ). At this time, they have declined refill due to pt will call back. Not home due to bad weather. Refill assessment call date has been updated per the patient's request.

## 2024-10-01 ENCOUNTER — Other Ambulatory Visit: Payer: Self-pay | Admitting: Family Medicine

## 2024-10-01 DIAGNOSIS — E88819 Insulin resistance, unspecified: Secondary | ICD-10-CM

## 2024-10-01 MED ORDER — PROMETHAZINE 25 MG TABLET
ORAL_TABLET | Freq: Three times a day (TID) | ORAL | 0 refills | 10.00000 days | Status: CP | PRN
Start: 2024-10-01 — End: 2024-10-11

## 2024-10-02 NOTE — Telephone Encounter (Signed)
 Discontinued 09/02/24.  Requested Prescriptions  Pending Prescriptions Disp Refills   metFORMIN  (GLUCOPHAGE -XR) 500 MG 24 hr tablet [Pharmacy Med Name: metFORMIN  HCl ER 500 MG Oral Tablet Extended Release 24 Hour] 100 tablet 2    Sig: TAKE 1 TABLET BY MOUTH DAILY  WITH BREAKFAST     Endocrinology:  Diabetes - Biguanides Failed - 10/02/2024  3:38 PM      Failed - B12 Level in normal range and within 720 days    Vitamin B-12  Date Value Ref Range Status  07/12/2022 466 200 - 1,100 pg/mL Final         Passed - Cr in normal range and within 360 days    Creat  Date Value Ref Range Status  04/10/2023 0.85 0.50 - 1.05 mg/dL Final   Creatinine, Ser  Date Value Ref Range Status  08/15/2024 0.81 0.44 - 1.00 mg/dL Final   Creatinine, Urine  Date Value Ref Range Status  09/02/2024 112 20 - 275 mg/dL Final         Passed - HBA1C is between 0 and 7.9 and within 180 days    Hemoglobin A1C  Date Value Ref Range Status  09/02/2024 5.4 4.0 - 5.6 % Final   Hgb A1c MFr Bld  Date Value Ref Range Status  10/24/2022 5.9 (H) <5.7 % of total Hgb Final    Comment:    For someone without known diabetes, a hemoglobin  A1c value between 5.7% and 6.4% is consistent with prediabetes and should be confirmed with a  follow-up test. . For someone with known diabetes, a value <7% indicates that their diabetes is well controlled. A1c targets should be individualized based on duration of diabetes, age, comorbid conditions, and other considerations. . This assay result is consistent with an increased risk of diabetes. . Currently, no consensus exists regarding use of hemoglobin A1c for diagnosis of diabetes for children. .          Passed - eGFR in normal range and within 360 days    GFR, Est African American  Date Value Ref Range Status  06/15/2020 72 > OR = 60 mL/min/1.11m2 Final   GFR, Est Non African American  Date Value Ref Range Status  06/15/2020 62 > OR = 60 mL/min/1.79m2 Final    GFR, Estimated  Date Value Ref Range Status  08/15/2024 >60 >60 mL/min Final    Comment:    (NOTE) Calculated using the CKD-EPI Creatinine Equation (2021)    eGFR  Date Value Ref Range Status  06/16/2023 65 >59 mL/min/1.73 Final         Passed - Valid encounter within last 6 months    Recent Outpatient Visits           5 days ago Mild chronic gastritis   [facility] Glenard Mire, MD   1 month ago Postinflammatory pulmonary fibrosis [facility])   [facility] Glenard Mire, MD   1 month ago Unintentional weight loss   [facility] Glenard Mire, MD   2 months ago Chronic constipation   [facility] Gareth Clarity F, FNP   3 months ago Urinary symptom or sign   [facility] Pardue, Sarah N, DO              Passed - CBC within normal limits and completed in the last 12 months    WBC  Date Value Ref Range Status  08/15/2024 3.8 (  L) 4.0 - 10.5 K/uL Final   RBC  Date Value Ref Range Status  08/15/2024 5.18 (H) 3.87 - 5.11 MIL/uL Final   Hemoglobin  Date Value Ref Range Status  08/15/2024 13.9 12.0 - 15.0 g/dL Final   HGB  Date Value Ref Range Status  12/10/2013 14.3 12.0 - 16.0 g/dL Final   HCT  Date Value Ref Range Status  08/15/2024 44.1 36.0 - 46.0 % Final  12/10/2013 42.4 35.0 - 47.0 % Final   MCHC  Date Value Ref Range Status  08/15/2024 31.5 30.0 - 36.0 g/dL Final   [facility]  Date Value Ref Range Status  08/15/2024 26.8 26.0 - 34.0 pg Final   MCV  Date Value Ref Range Status  08/15/2024 85.1 80.0 - 100.0 fL Final  12/10/2013 84 80 - 100 fL Final   No results found for: PLTCOUNTKUC, LABPLAT, POCPLA RDW  Date Value Ref Range Status  08/15/2024 13.2 11.5 - 15.5 % Final  12/10/2013 14.1 11.5 - 14.5 % Final

## 2024-10-02 NOTE — Patient Instructions (Addendum)
 Follow-up instructions:  - Please continue taking your medications as prescribed for your mental health.      - START Mirtazapine  (Remeron ) 7.5 mg at bedtime for sleep, appetite, and mood     - CONTINUE Lamotrigine  (Lamictal ) 200 mg at bedtime, Bupropion  (Wellbutrin ) 300 mg daily, Trazodone  150 mg at bedtime, Hydroxyzine  (Atarax ) 10 mg twice daily as needed for anxiety     -- NEXT APPOINTMENT: 11/07/2024 at 2:15 PM VIRTUAL     - Do not make changes to your medications, including taking more or less than prescribed, unless under the supervision of your physician. Be aware that some medications may make you feel worse if abruptly stopped  - Please refrain from using illicit substances, as these can affect your mood and could cause anxiety or other concerning symptoms.   - Seek further medical care for any increase in symptoms or new symptoms such as thoughts of wanting to hurt yourself or hurt others.     Contact info:  Life-threatening emergencies: Call 911, the 988 suicide and crisis lifeline, or go to the nearest ER for medical or psychiatric attention.           Issues that need urgent attention but are not life threatening: Call the clinic outpatient front desk for assistance:    - [phone] for [facility] adult psychiatry clinics located at [address] [facility]  - [phone] for [facility] child and adolescent psychiatry clinics located at [address]  - [phone] for [facility] and adult psychiatry clinics located at [address] [facility] [facility]    Non-urgent routine concerns and questions: Send a message through [facility] or call our clinic front desk.    Refill requests: Check with your pharmacy to initiate refill requests.    Regarding appointments:  - If you need to cancel your appointment, we ask that you call your clinic at least 24 hours before your scheduled appointment at the numbers listed above.  - If for any reason you arrive 15 minutes later than your scheduled appointment time, you may not be seen and your visit may be rescheduled.  - Please remember that we will not automatically reschedule missed appointments.  - If you miss two (2) appointments without letting us  know in advance, you will likely be referred to a provider in your community.  - We will do our best to be on time. Sometimes an emergency will arise that might cause your clinician to be late. We will try to inform you of this when you check in for your appointment. If you wait more than 15 minutes past your appointment time without such notice, please speak with the front desk staff.    In the event of bad weather, the clinic staff will attempt to contact you, should your appointment need to be rescheduled. Additionally, you can call the Patient Weather Line [phone] for system-wide clinic status    For more information and reminders regarding clinic policies (these were provided when you were admitted to the clinic), please ask the front desk.

## 2024-10-02 NOTE — Progress Notes (Signed)
 [facility]  Psychiatry   Established Patient E&M Service - Outpatient     Assessment:  Caitlin Smith presents for follow-up evaluation. Patient was last seen 07/2024 by Dr. Shelda at which time patient with mild exacerbation of anxiety secondary to sister's passing, was prescribed short course of Atarax . Since then, patient with ongoing unintentional weight loss, GI distress; underwent EGD 1/15 that did not have significant findings. Today, patient with ongoing anxiety secondary to medical conditions, poor sleep, poor appetite, mild depression, denies SI/HI; no new acute modifiable safety concerns.  Plan to start Remeron  7.5 mg at bedtime for dual benefit of mood/sleep/appetite. Also encouraged lifestyle interventions (light exercise) for mood. Continues in therapy through pain management clinic. Could consider transition from Wellbutrin  to SSRI for better control of anxiety in the future. RTC 5 weeks.     Identifying Information:  Caitlin Smith is a 69 y.o. female with a history of  MDD, GAD, and Panic Disorder in the context of many chronic medical conditions including chronic pain/fibromyalgia, osteoarthritis with degenerative disc disease, Lupus, pituitary tumor (benign), s/p right total knee arthroplasty, and VIN II (s/p resection). She had been maintained on Lamictal , trazodone , klonopin , and Wellbutrin . Due concerns for polypharmacy and impact on cognition, Klonopin  has been gradually tapered to discontinuation and recently tolerated a Lamictal  taper. Her mood can be exacerbated by steroid use for pulmonary disease. Patient is engaged in therapy at the pain clinic and finds it helpful.     Past medications:  - Cymbalta   - Effexor    - Klonopin  - effective, taper completed 07/2024     Risk Assessment:  An assessment of suicide and violence risk factors was performed as part of this evaluation and is not significantly changed from the last visit. While future psychiatric events cannot be accurately predicted, the patient does not currently require acute inpatient psychiatric care and does not currently meet [place]  involuntary commitment criteria.      Plan:  Problem: Major Depressive Disorder - Anxiety NOS - Panic Disorder   Status of problem: chronic and stable  Interventions:   -- Continue Lamictal  200mg  qhs for mood (decreased from 200 mg BID in 04/2024 and patient tolerated this well)  -- Continue Wellbutrin  300 mg daily   -- Continue Atarax  10 mg BID PRN for anxiety/sleep - may stop if Remeron  effective - PCP prescribing   -- Followed by Dr. Greig Holland with pain management for therapy   -- Encouraged lifestyle interventions - exercise       Problem: Insomnia - Poor Appetite   Status of problem: chronic and stable  Interventions:   -- Last sleep study performed in 2017. Per chart review, showed mild OSA likely related to medication use. Neurology recommends patient avoid sleeping on back as she likely has OSA when sleeping in this position. Not using CPAP   -- START Mirtazapine  7.5 mg at bedtime   -- Continue trazodone  150 mg at bedtime  -- Continue melatonin 3-5 mg at bedtime     Problem: Concern for Neurocognitive Disorder   Status of problem:  chronic   Interventions:  -- Patient seen by Neurology ([facility]) 05/22/18 dx pt with mild neurocognitive impairment w/ deficits in inattention and visual-spatial function with suspicion that sleep apnea and polypharmacy may be contributing to her difficulties. Also likely that patient's Lupus is contributing to cognitive deficits.  -- Last MOCA 22 on 12/12/2017  -- Repeat MOCA at next in-person visit     Problem:  Polypharmacy   Status of problem:  chronic and stable  Interventions:  -- Patient amendable to de-prescribing/safer alternatives  -- Consider transition from Wellbutrin  to Zoloft or Lexapro for anxiety/depression, decreasing trazodone , discontinuing Lamictal , and encouraging not using Klonopin /safer alternatives to avoid it     Problem: High Risk Medication monitoring, Lamictal   Status of problem:  Stable   Interventions:   - CBC 06/05/24 with ANC 2.0  - RFT 06/05/24 Cr 0.91, eGFR 70   - AST and ALT 01/18/24 WNLs    Problem: Chronic Conditions  Status of problem:  chronic and stable  Interventions:  -- Chronic pain/fibromyalgia: Pregabalin  75 mg BID   -- Lupus: hydroxychloroquine   -- Constipation: Linzess , prucalopride  -- HTN: telmisartan , hydralazine , amlodipine    -- HLD: atorvastatin   -- Restrictive lung disease: mycophenolate , inhalers, pulmonary rehab, intermittent steroid use      Psychotherapy provided:  No billable psychotherapy service provided but brief supportive therapy was utilized.    Patient has been given this writer's contact information as well as the [facility] Psychiatry urgent line number. The patient has been instructed to call 911 for emergencies.    Patient was seen and plan of care was discussed with the Attending  MD, Dr.Cohen, who agrees with the above statement and plan.    Gustav Allan, MD   [facility] Psychiatry PGY-2       Subjective:  Chart Review:   -  Ongoing N/V, weight loss, had endoscopy 09/19/24. No conclusive source or her nausea/vomiting.   - Saw PCP 1/23 for same concern; advised to follow up with GI and continue supportive measures     History of Present Illness  Over several months she has had significant anxiety and frustration related to persistent nausea and upper abdominal pain without clear medical explanation. She has low mood but does not feel severely depressed. She contacted her pain clinic counselor when she felt herself starting to get depressed but could not get an appointment until February. She denies suicidal or homicidal thoughts. Sleep is poor. She wakes after 2 to 3 hours and cannot fall back asleep. She links this to stress and health-related worry. Daytime and nighttime anxiety and low mood both contribute.Endorses poor appetite due to GI pain and has had significant weight loss since October.     Reviewed medications with patient. States she tried the Hydroxyzine  and felt it was effective, but feels like her anxiety is more pervasive and that it may not be beneficial enough.     She lives in [place] and is currently staying at her daughter's home. She has 2 children and 5 grandchildren. Her daughter lives about 20 minutes away and her son about an hour away.      Objective:    Mental Status Exam:     Appearance:    Appears stated age, Well nourished and Clean/Neat   Motor:    No abnormal movements appreciated   Speech/Language:    Normal rate, volume, tone, fluency and Language intact, well formed   Mood:   Hanging in there   Affect:   Calm, Cooperative and Full   Thought process and Associations:   Logical, linear, clear, coherent, goal directed   Abnormal/psychotic thought content:     Denies SI, HI, self harm, delusions, obsessions, paranoid ideation, or ideas of reference   Perceptual disturbances:     Denies auditory and visual hallucinations, behavior not concerning for response to internal stimuli     Other:   insight/judgement intact  Wt Readings from Last 6 Encounters:   09/20/24 79.7 kg (175 lb 9.6 oz)   09/19/24 78.9 kg (174 lb)   09/13/24 79.8 kg (176 lb)   09/10/24 80 kg (176 lb 6.4 oz)   07/09/24 86 kg (189 lb 9.6 oz)   06/05/24 85.7 kg (189 lb)     The patient reports they are physically located in [place]  and is currently: at home. I conducted a audio/video visit. I spent  63m 44s on the video call with the patient. I spent an additional 10 minutes on pre- and post-visit activities on the date of service .     Gustav KANDICE Allan, MD  PGY-2 Psychiatry

## 2024-10-03 DIAGNOSIS — F411 Generalized anxiety disorder: Principal | ICD-10-CM

## 2024-10-03 DIAGNOSIS — F3342 Major depressive disorder, recurrent, in full remission: Secondary | ICD-10-CM

## 2024-10-03 MED ORDER — MIRTAZAPINE 7.5 MG TABLET
ORAL_TABLET | Freq: Every evening | ORAL | 1 refills | 30.00000 days | Status: CP
Start: 2024-10-03 — End: 2024-12-02

## 2024-10-06 NOTE — Progress Notes (Signed)
This was a video service where a resident was involved. The resident and the patient met via video.  As the attending physician, I spent 5 minutes in medical discussion with the patient via real-time audio and video, participating in the key portions of the service.I spent an additional 10 minutes on pre- and post-visit activities which were specific to the patient and included reviewing the patient’s medical records, lab results, imaging results, and other pertinent records. I reviewed the resident's note and I agree with the resident's findings and plan.    Cristino Degroff L Ainara Eldridge, MD

## 2024-10-07 NOTE — Telephone Encounter (Signed)
 Patient called in and left a voicemail stating that her appointment for today 2/2 was cancelled but she needed to be seen sooner than April which was the first available to schedule. She stated she has been coughing a lot recently and producing a lot of mucous and her medication does not seem to be working.    Routing to provider for advise on what to do

## 2024-10-08 MED ORDER — BUPROPION HCL XL 300 MG 24 HR TABLET, EXTENDED RELEASE
ORAL_TABLET | Freq: Every day | ORAL | 0 refills | 90.00000 days
Start: 2024-10-08 — End: 2025-01-06

## 2024-10-09 MED ORDER — BUPROPION HCL XL 300 MG 24 HR TABLET, EXTENDED RELEASE
ORAL_TABLET | Freq: Every day | ORAL | 0 refills | 90.00000 days | Status: CP
Start: 2024-10-09 — End: 2025-01-07

## 2024-10-10 NOTE — Telephone Encounter (Signed)
 Received request from patient for letter stating need for O2 with air travel. During her last , she did not desaturate below 92%, however, that was several years ago and she has yet to update her . Given that her travel is impending and we are unlikely to be able to get an altitude or repeat , I believe the benefits of her having access to supplemental O2 during air travel outweigh the risks and I have written a letter recommending she have access to this if she needs it during travel.     Guido Robby Bullock, MD   Fellow, Pulmonary and Critical Care Medicine  Pager [phone]  October 10, 2024

## 2024-10-10 NOTE — Progress Notes (Signed)
 [facility] Refill Coordination Note    Specialty Medication(s) to be Shipped:   Inflammatory Disorders: mycophenolate     Other medication(s) to be shipped: No additional medications requested for fill at this time    Specialty Medications not needed at this time: N/A     NERI VIEYRA, DOB: 1956/06/20  Phone: [phone] (home) [phone] (work)      All above HIPAA information was verified with patient.     Was a nurse, learning disability used for this call? No    Completed refill call assessment today to schedule patient's medication shipment from the [facility]  [phone]).  All relevant notes have been reviewed.     Specialty medication(s) and dose(s) confirmed: Regimen is correct and unchanged.   Changes to medications: Loucinda reports no changes at this time.  Changes to insurance: Yes: [employer]   New side effects reported not previously addressed with a pharmacist or physician: None reported  Questions for the pharmacist: No    Confirmed patient received a Conservation Officer, Historic Buildings and a Surveyor, Mining with first shipment. The patient will receive a drug information handout for each medication shipped and additional FDA Medication Guides as required.       DISEASE/MEDICATION-SPECIFIC INFORMATION        N/A    SPECIALTY MEDICATION ADHERENCE     Medication Adherence    Patient reported X missed doses in the last month: 0  Specialty Medication: mycophenolate  360 MG Tbec (MYFORTIC )  Patient is on additional specialty medications: No              Were doses missed due to medication being on hold? No      mycophenolate  360 MG Tbec (MYFORTIC ): 10days of medicine on hand       Specialty medication is an injection or given on a cycle: No    REFERRAL TO PHARMACIST     Referral to the pharmacist: Not needed      [facility]     Shipping address confirmed in Epic.     Cost and Payment: Patient has a copay of $5.10. They are aware and have authorized the pharmacy to charge the credit card on file.    Delivery Scheduled: Yes, Expected medication delivery date: 2.10.26.     Medication will be delivered via Next Day Courier to the prescription address in Epic WAM.    [name]   [facility]  Specialty Technician

## 2024-10-10 NOTE — Telephone Encounter (Signed)
-----   Message from Catheline Bacca, RN sent at 10/10/2024 10:05 AM EST -----  Regarding: O2 letter request  Patient called and stated her son is taking her out of town next Thursday 2/12. Patient is requesting a letter to be able to use supplemental O2 on a plane. Looking through her chart it doesn't look like she is on oxygen. Please advise.   Thank you

## 2025-05-15 ENCOUNTER — Ambulatory Visit
